# Patient Record
Sex: Female | Born: 1950 | Race: White | Hispanic: No | Smoking: Never smoker
Health system: Southern US, Community
[De-identification: ages and names within clinical notes are randomized; demographics above are authoritative.]

## PROBLEM LIST (undated history)

## (undated) DIAGNOSIS — I10 Essential (primary) hypertension: Secondary | ICD-10-CM

## (undated) DIAGNOSIS — I219 Acute myocardial infarction, unspecified: Secondary | ICD-10-CM

## (undated) DIAGNOSIS — N814 Uterovaginal prolapse, unspecified: Secondary | ICD-10-CM

## (undated) DIAGNOSIS — G459 Transient cerebral ischemic attack, unspecified: Secondary | ICD-10-CM

## (undated) DIAGNOSIS — I209 Angina pectoris, unspecified: Secondary | ICD-10-CM

## (undated) DIAGNOSIS — R7989 Other specified abnormal findings of blood chemistry: Secondary | ICD-10-CM

## (undated) DIAGNOSIS — F32A Depression, unspecified: Secondary | ICD-10-CM

## (undated) DIAGNOSIS — F0390 Unspecified dementia without behavioral disturbance: Secondary | ICD-10-CM

## (undated) DIAGNOSIS — G35 Multiple sclerosis: Secondary | ICD-10-CM

## (undated) DIAGNOSIS — K5732 Diverticulitis of large intestine without perforation or abscess without bleeding: Secondary | ICD-10-CM

## (undated) DIAGNOSIS — H269 Unspecified cataract: Secondary | ICD-10-CM

## (undated) DIAGNOSIS — I4719 Other supraventricular tachycardia: Secondary | ICD-10-CM

## (undated) DIAGNOSIS — I2510 Atherosclerotic heart disease of native coronary artery without angina pectoris: Secondary | ICD-10-CM

## (undated) DIAGNOSIS — G43909 Migraine, unspecified, not intractable, without status migrainosus: Secondary | ICD-10-CM

## (undated) DIAGNOSIS — IMO0001 Reserved for inherently not codable concepts without codable children: Secondary | ICD-10-CM

## (undated) DIAGNOSIS — E785 Hyperlipidemia, unspecified: Secondary | ICD-10-CM

## (undated) DIAGNOSIS — K219 Gastro-esophageal reflux disease without esophagitis: Secondary | ICD-10-CM

## (undated) DIAGNOSIS — G4730 Sleep apnea, unspecified: Secondary | ICD-10-CM

## (undated) DIAGNOSIS — I739 Peripheral vascular disease, unspecified: Secondary | ICD-10-CM

## (undated) DIAGNOSIS — G35D Multiple sclerosis, unspecified: Secondary | ICD-10-CM

## (undated) DIAGNOSIS — I471 Supraventricular tachycardia: Secondary | ICD-10-CM

## (undated) DIAGNOSIS — E119 Type 2 diabetes mellitus without complications: Secondary | ICD-10-CM

## (undated) DIAGNOSIS — F419 Anxiety disorder, unspecified: Secondary | ICD-10-CM

## (undated) DIAGNOSIS — Z8719 Personal history of other diseases of the digestive system: Secondary | ICD-10-CM

## (undated) DIAGNOSIS — I639 Cerebral infarction, unspecified: Secondary | ICD-10-CM

## (undated) DIAGNOSIS — I6529 Occlusion and stenosis of unspecified carotid artery: Secondary | ICD-10-CM

## (undated) DIAGNOSIS — I509 Heart failure, unspecified: Secondary | ICD-10-CM

## (undated) DIAGNOSIS — F329 Major depressive disorder, single episode, unspecified: Secondary | ICD-10-CM

## (undated) DIAGNOSIS — M1990 Unspecified osteoarthritis, unspecified site: Secondary | ICD-10-CM

## (undated) DIAGNOSIS — N189 Chronic kidney disease, unspecified: Secondary | ICD-10-CM

## (undated) DIAGNOSIS — Z794 Long term (current) use of insulin: Secondary | ICD-10-CM

## (undated) HISTORY — DX: Diverticulitis of large intestine without perforation or abscess without bleeding: K57.32

## (undated) HISTORY — DX: Anxiety disorder, unspecified: F41.9

## (undated) HISTORY — DX: Uterovaginal prolapse, unspecified: N81.4

## (undated) HISTORY — DX: Hyperlipidemia, unspecified: E78.5

## (undated) HISTORY — PX: CHOLECYSTECTOMY: SHX55

## (undated) HISTORY — DX: Transient cerebral ischemic attack, unspecified: G45.9

## (undated) HISTORY — DX: Major depressive disorder, single episode, unspecified: F32.9

## (undated) HISTORY — DX: Unspecified cataract: H26.9

## (undated) HISTORY — DX: Essential (primary) hypertension: I10

## (undated) HISTORY — DX: Multiple sclerosis, unspecified: G35.D

## (undated) HISTORY — DX: Peripheral vascular disease, unspecified: I73.9

## (undated) HISTORY — DX: Depression, unspecified: F32.A

## (undated) HISTORY — DX: Reserved for inherently not codable concepts without codable children: IMO0001

## (undated) HISTORY — DX: Multiple sclerosis: G35

## (undated) HISTORY — DX: Supraventricular tachycardia: I47.1

## (undated) HISTORY — PX: CORONARY ANGIOPLASTY WITH STENT PLACEMENT: SHX49

## (undated) HISTORY — DX: Gastro-esophageal reflux disease without esophagitis: K21.9

## (undated) HISTORY — DX: Heart failure, unspecified: I50.9

## (undated) HISTORY — DX: Atherosclerotic heart disease of native coronary artery without angina pectoris: I25.10

## (undated) HISTORY — DX: Long term (current) use of insulin: Z79.4

## (undated) HISTORY — DX: Other supraventricular tachycardia: I47.19

## (undated) HISTORY — DX: Type 2 diabetes mellitus without complications: E11.9

## (undated) HISTORY — PX: APPENDECTOMY: SHX54

## (undated) HISTORY — DX: Occlusion and stenosis of unspecified carotid artery: I65.29

---

## 1979-05-26 DIAGNOSIS — J189 Pneumonia, unspecified organism: Secondary | ICD-10-CM

## 1979-05-26 DIAGNOSIS — G459 Transient cerebral ischemic attack, unspecified: Secondary | ICD-10-CM

## 1979-05-26 HISTORY — DX: Pneumonia, unspecified organism: J18.9

## 1979-05-26 HISTORY — DX: Transient cerebral ischemic attack, unspecified: G45.9

## 1979-05-26 HISTORY — PX: BREAST LUMPECTOMY: SHX2

## 1979-05-26 HISTORY — PX: BREAST BIOPSY: SHX20

## 1984-09-24 HISTORY — PX: ABDOMINAL HYSTERECTOMY: SHX81

## 1999-10-24 ENCOUNTER — Inpatient Hospital Stay (HOSPITAL_COMMUNITY): Admission: EM | Admit: 1999-10-24 | Discharge: 1999-10-26 | Payer: Self-pay | Admitting: Cardiology

## 2000-09-24 HISTORY — PX: COLONOSCOPY: SHX174

## 2001-10-28 ENCOUNTER — Other Ambulatory Visit: Admission: RE | Admit: 2001-10-28 | Discharge: 2001-10-28 | Payer: Self-pay | Admitting: Family Medicine

## 2004-07-28 ENCOUNTER — Ambulatory Visit: Payer: Self-pay

## 2004-11-24 ENCOUNTER — Other Ambulatory Visit: Admission: RE | Admit: 2004-11-24 | Discharge: 2004-11-24 | Payer: Self-pay | Admitting: Family Medicine

## 2004-12-19 ENCOUNTER — Ambulatory Visit (HOSPITAL_COMMUNITY): Admission: RE | Admit: 2004-12-19 | Discharge: 2004-12-19 | Payer: Self-pay | Admitting: Neurology

## 2005-07-17 ENCOUNTER — Ambulatory Visit: Payer: Self-pay | Admitting: Psychiatry

## 2006-05-31 ENCOUNTER — Other Ambulatory Visit: Admission: RE | Admit: 2006-05-31 | Discharge: 2006-05-31 | Payer: Self-pay | Admitting: Family Medicine

## 2007-02-05 ENCOUNTER — Ambulatory Visit: Payer: Self-pay | Admitting: Cardiology

## 2007-02-12 ENCOUNTER — Ambulatory Visit: Payer: Self-pay

## 2007-03-18 ENCOUNTER — Ambulatory Visit: Payer: Self-pay | Admitting: Cardiology

## 2007-03-18 LAB — CONVERTED CEMR LAB: Cortisol, Plasma: 9.9 ug/dL

## 2007-03-21 ENCOUNTER — Ambulatory Visit: Payer: Self-pay | Admitting: Cardiology

## 2007-03-22 ENCOUNTER — Ambulatory Visit: Payer: Self-pay | Admitting: Cardiology

## 2007-03-22 ENCOUNTER — Inpatient Hospital Stay (HOSPITAL_COMMUNITY): Admission: EM | Admit: 2007-03-22 | Discharge: 2007-03-24 | Payer: Self-pay | Admitting: Cardiology

## 2007-03-24 ENCOUNTER — Ambulatory Visit: Payer: Self-pay | Admitting: *Deleted

## 2007-03-24 ENCOUNTER — Encounter: Payer: Self-pay | Admitting: Cardiology

## 2007-04-14 ENCOUNTER — Inpatient Hospital Stay (HOSPITAL_COMMUNITY): Admission: AD | Admit: 2007-04-14 | Discharge: 2007-04-17 | Payer: Self-pay | Admitting: Cardiology

## 2007-04-14 ENCOUNTER — Ambulatory Visit: Payer: Self-pay | Admitting: Cardiology

## 2007-05-21 ENCOUNTER — Ambulatory Visit: Payer: Self-pay | Admitting: Cardiology

## 2007-06-25 ENCOUNTER — Ambulatory Visit: Payer: Self-pay | Admitting: Cardiology

## 2007-11-26 ENCOUNTER — Ambulatory Visit: Payer: Self-pay | Admitting: Cardiology

## 2008-02-09 ENCOUNTER — Ambulatory Visit: Payer: Self-pay | Admitting: Internal Medicine

## 2008-02-09 ENCOUNTER — Inpatient Hospital Stay (HOSPITAL_COMMUNITY): Admission: EM | Admit: 2008-02-09 | Discharge: 2008-02-10 | Payer: Self-pay | Admitting: Emergency Medicine

## 2008-02-18 ENCOUNTER — Ambulatory Visit: Payer: Self-pay

## 2008-02-18 ENCOUNTER — Ambulatory Visit: Payer: Self-pay | Admitting: Cardiology

## 2008-02-18 ENCOUNTER — Encounter: Payer: Self-pay | Admitting: Cardiology

## 2008-02-18 LAB — CONVERTED CEMR LAB
BUN: 6 mg/dL (ref 6–23)
CO2: 32 meq/L (ref 19–32)
Chloride: 99 meq/L (ref 96–112)
Creatinine, Ser: 1.1 mg/dL (ref 0.4–1.2)
GFR calc non Af Amer: 54 mL/min
Potassium: 3.5 meq/L (ref 3.5–5.1)

## 2008-08-26 DIAGNOSIS — I779 Disorder of arteries and arterioles, unspecified: Secondary | ICD-10-CM | POA: Insufficient documentation

## 2008-08-26 DIAGNOSIS — I214 Non-ST elevation (NSTEMI) myocardial infarction: Secondary | ICD-10-CM

## 2008-08-26 DIAGNOSIS — I2510 Atherosclerotic heart disease of native coronary artery without angina pectoris: Secondary | ICD-10-CM | POA: Insufficient documentation

## 2008-08-26 DIAGNOSIS — I25119 Atherosclerotic heart disease of native coronary artery with unspecified angina pectoris: Secondary | ICD-10-CM | POA: Insufficient documentation

## 2008-08-26 DIAGNOSIS — I739 Peripheral vascular disease, unspecified: Secondary | ICD-10-CM

## 2008-08-26 DIAGNOSIS — I25110 Atherosclerotic heart disease of native coronary artery with unstable angina pectoris: Secondary | ICD-10-CM | POA: Insufficient documentation

## 2009-03-07 ENCOUNTER — Encounter (INDEPENDENT_AMBULATORY_CARE_PROVIDER_SITE_OTHER): Payer: Self-pay | Admitting: *Deleted

## 2009-05-10 DIAGNOSIS — I739 Peripheral vascular disease, unspecified: Secondary | ICD-10-CM

## 2009-05-10 DIAGNOSIS — E782 Mixed hyperlipidemia: Secondary | ICD-10-CM | POA: Insufficient documentation

## 2009-05-10 DIAGNOSIS — I2510 Atherosclerotic heart disease of native coronary artery without angina pectoris: Secondary | ICD-10-CM | POA: Insufficient documentation

## 2009-05-10 DIAGNOSIS — K5732 Diverticulitis of large intestine without perforation or abscess without bleeding: Secondary | ICD-10-CM | POA: Insufficient documentation

## 2009-05-10 DIAGNOSIS — G35 Multiple sclerosis: Secondary | ICD-10-CM

## 2009-05-10 DIAGNOSIS — F418 Other specified anxiety disorders: Secondary | ICD-10-CM | POA: Insufficient documentation

## 2009-05-10 DIAGNOSIS — E1169 Type 2 diabetes mellitus with other specified complication: Secondary | ICD-10-CM | POA: Insufficient documentation

## 2009-05-10 DIAGNOSIS — E785 Hyperlipidemia, unspecified: Secondary | ICD-10-CM

## 2009-09-28 ENCOUNTER — Ambulatory Visit: Payer: Self-pay | Admitting: Cardiology

## 2009-10-07 ENCOUNTER — Telehealth: Payer: Self-pay | Admitting: Cardiology

## 2009-10-07 ENCOUNTER — Encounter: Payer: Self-pay | Admitting: Cardiology

## 2009-10-21 ENCOUNTER — Ambulatory Visit: Payer: Self-pay

## 2009-10-21 ENCOUNTER — Encounter: Payer: Self-pay | Admitting: Cardiology

## 2009-12-28 ENCOUNTER — Telehealth: Payer: Self-pay | Admitting: Cardiology

## 2009-12-29 ENCOUNTER — Ambulatory Visit: Payer: Self-pay | Admitting: Cardiology

## 2009-12-30 LAB — CONVERTED CEMR LAB
BUN: 14 mg/dL (ref 6–23)
Basophils Relative: 0.5 % (ref 0.0–3.0)
CO2: 34 meq/L — ABNORMAL HIGH (ref 19–32)
Chloride: 102 meq/L (ref 96–112)
Eosinophils Absolute: 0.1 10*3/uL (ref 0.0–0.7)
Eosinophils Relative: 1.6 % (ref 0.0–5.0)
HCT: 34.4 % — ABNORMAL LOW (ref 36.0–46.0)
Lymphs Abs: 3.5 10*3/uL (ref 0.7–4.0)
MCHC: 34.2 g/dL (ref 30.0–36.0)
MCV: 91.7 fL (ref 78.0–100.0)
Monocytes Absolute: 0.6 10*3/uL (ref 0.1–1.0)
Potassium: 3.2 meq/L — ABNORMAL LOW (ref 3.5–5.1)
RBC: 3.76 M/uL — ABNORMAL LOW (ref 3.87–5.11)
WBC: 6.9 10*3/uL (ref 4.5–10.5)

## 2010-01-05 ENCOUNTER — Inpatient Hospital Stay (HOSPITAL_BASED_OUTPATIENT_CLINIC_OR_DEPARTMENT_OTHER): Admission: RE | Admit: 2010-01-05 | Discharge: 2010-01-05 | Payer: Self-pay | Admitting: Cardiology

## 2010-01-05 ENCOUNTER — Ambulatory Visit: Payer: Self-pay | Admitting: Cardiology

## 2010-01-11 ENCOUNTER — Ambulatory Visit: Payer: Self-pay | Admitting: Cardiology

## 2010-01-11 ENCOUNTER — Inpatient Hospital Stay (HOSPITAL_COMMUNITY): Admission: RE | Admit: 2010-01-11 | Discharge: 2010-01-12 | Payer: Self-pay | Admitting: Cardiology

## 2010-02-09 ENCOUNTER — Encounter: Payer: Self-pay | Admitting: Cardiology

## 2010-02-10 ENCOUNTER — Ambulatory Visit: Payer: Self-pay | Admitting: Cardiology

## 2010-02-10 DIAGNOSIS — R002 Palpitations: Secondary | ICD-10-CM

## 2010-03-16 ENCOUNTER — Encounter: Payer: Self-pay | Admitting: Cardiology

## 2010-06-26 ENCOUNTER — Encounter: Admission: RE | Admit: 2010-06-26 | Discharge: 2010-06-26 | Payer: Self-pay | Admitting: Specialist

## 2010-10-24 NOTE — Cardiovascular Report (Signed)
Summary: Pre Op Orders  Pre Op Orders   Imported By: Roderic Ovens 10/10/2009 11:31:07  _____________________________________________________________________  External Attachment:    Type:   Image     Comment:   External Document

## 2010-10-24 NOTE — Miscellaneous (Signed)
Summary: rx for KCL   Clinical Lists Changes  Medications: Added new medication of POTASSIUM CHLORIDE CR 10 MEQ CR-CAPS (POTASSIUM CHLORIDE) one daily - Signed Rx of POTASSIUM CHLORIDE CR 10 MEQ CR-CAPS (POTASSIUM CHLORIDE) one daily;  #30 x 6;  Signed;  Entered by: Charolotte Capuchin, RN;  Authorized by: Rollene Rotunda, MD, [facility];  Method used: Electronically to [facility]*, [address], [place], [place], [place]  [address], Ph: [phone], Fax: [phone]    Prescriptions: POTASSIUM CHLORIDE CR 10 MEQ CR-CAPS (POTASSIUM CHLORIDE) one daily  #30 x 6   Entered by:   Charolotte Capuchin, RN   Authorized by:   Rollene Rotunda, MD, [facility]   Signed by:   Charolotte Capuchin, RN on 12/30/2009   Method used:   Electronically to        [facility]* (retail)       [address]       [place], [place]  [address]       Ph: [phone]       Fax: [phone]   RxID:   [id number]

## 2010-10-24 NOTE — Progress Notes (Signed)
Summary: cx cath  Phone Note From Other Clinic   Summary of Call: received call from [facility] in [facility] lab, he stated pt no showed for cath, when he called and spoke w/pt she told him she had talked to someone the office yest and cancelled cath, Loraine Leriche states Dr Antoine Poche wants pt resch for [facility] 1/17.  Upon looking into it [place] had called pt to sch her carotids and pt stated she didn't feel well and needed to cx, Merita Norton stated she passed mess along to [employer] and spoke w/pt, she states she has too much going on doesn't feel well and will call back when she is ready to sch cath, explained Dr Antoine Poche felt it was important for pt to have cath soon and offered to sch it for Mon 1/17, again pt states she will c/b to sch, will send mess to [facility] and Dr Clent Demark, RN  October 07, 2009 12:06 PM

## 2010-10-24 NOTE — Miscellaneous (Signed)
Summary: Orders Update  Clinical Lists Changes  Medications: Added new medication of PREDNISONE 20 MG TABS (PREDNISONE) take 3 tablet evening before cath and 3 tablets the morning of at 9:30am - Signed Rx of PREDNISONE 20 MG TABS (PREDNISONE) take 3 tablet evening before cath and 3 tablets the morning of at 9:30am;  #6 x 0;  Signed;  Entered by: Charolotte Capuchin, RN;  Authorized by: Rollene Rotunda, MD, [facility];  Method used: Electronically to [facility]*, [address] [place], [place], [place]  [address], Ph: [phone], Fax: [phone]    Prescriptions: PREDNISONE 20 MG TABS (PREDNISONE) take 3 tablet evening before cath and 3 tablets the morning of at 9:30am  #6 x 0   Entered by:   Charolotte Capuchin, RN   Authorized by:   Rollene Rotunda, MD, [facility]   Signed by:   Charolotte Capuchin, RN on 12/29/2009   Method used:   Electronically to        [facility]* (retail)       [address]       [place], [place]  [address]       Ph: [phone]       Fax: [phone]   RxID:   [id number]

## 2010-10-24 NOTE — Progress Notes (Signed)
Summary: Pt calling to set up cath.  Phone Note Call from Patient Call back at Home Phone [phone]   Caller: Patient Summary of Call: Pt calling to set up a cath Initial call taken by: Judie Grieve,  December 28, 2009 1:55 PM  Follow-up for Phone Call        Called pt and advised that she needs to see Dr. Antoine Poche for h&p pre-cath since it has been 3 months since last office visit.  Appoint made for 12/29/09 at 2pm. Follow-up by: Mylo Red RN BSN

## 2010-10-24 NOTE — Assessment & Plan Note (Signed)
Summary: EPH/JML   Visit Type:  Follow-up Primary Provider:  Dr. Vernon Prey  CC:  Chest Pain and Palpitations.  History of Present Illness: The patient presents for evaluation of palpitations. She is also status post a recent percutaneous intervention treating an 80% diagonal stenosis. Since that time she has had one episode of severe chest discomfort. This happened when her children were in her house and fighting. She became very stressed and developed chest pain. She had taken nitroglycerin and alprazolam. She is otherwise not getting the chest discomfort with any physical exertion though she doesn't overly exert herself. She recently walked around a museum and became tired but did not have chest pain. However, she is having episodes of palpitations. These happen sporadically. She may notice some when she bends over. She might feel somewhat dizzy but has had no presyncope or syncope. She says that time she's felt a staggering gait and has leg weakness and wonders if it could be her statin. However, she also has this vague diagnosis of possible multiple sclerosis for which she received no followup or treatment.  Current Medications (verified): 1)  Crestor 40 Mg Tabs (Rosuvastatin Calcium) .Marland Kitchen.. 1 By Mouth Daily 2)  Zetia 10 Mg Tabs (Ezetimibe) .... Take One Tablet By Mouth Daily. 3)  Isosorbide Mononitrate Cr 120 Mg Xr24h-Tab (Isosorbide Mononitrate) .Marland Kitchen.. 1 By Mouth Daily 4)  Fish Oil   Oil (Fish Oil) .Marland Kitchen.. 1 By Mouth Once Daily 5)  Furosemide 40 Mg Tabs (Furosemide) .Marland Kitchen.. 1 By Mouth Two Times A Day 6)  Lantus 100 Unit/ml Soln (Insulin Glargine) .... As Directed 7)  Atenolol 50 Mg Tabs (Atenolol) .... Take One and Half Tablet By Mouth Two Times A Day 8)  Zolpidem Tartrate 10 Mg Tabs (Zolpidem Tartrate) .... As Needed 9)  Alprazolam 0.5 Mg Tabs (Alprazolam) .... As Needed 10)  Lisinopril 20 Mg Tabs (Lisinopril) .Marland Kitchen.. 1 By Mouth Daily 11)  Plavix 75 Mg Tabs (Clopidogrel Bisulfate) .Marland Kitchen.. 1 By Mouth  Daily 12)  Citalopram Hydrobromide 20 Mg Tabs (Citalopram Hydrobromide) .Marland Kitchen.. 1 By Mouth Daily 13)  Multivitamins   Tabs (Multiple Vitamin) .Marland Kitchen.. 1 By Mouth Daily 14)  Aspirin 325 Mg  Tabs (Aspirin) .Marland Kitchen.. 1 By Mouth Daily 15)  Actos 30 Mg Tabs (Pioglitazone Hcl) .Marland Kitchen.. 1 Tab Once Daily 16)  Humalog 100 Unit/ml Soln (Insulin Lispro (Human)) .... As Directed 17)  Potassium Chloride Cr 10 Meq Cr-Caps (Potassium Chloride) .... One Daily 18)  Nitrostat 0.4 Mg Subl (Nitroglycerin) .Marland Kitchen.. 1 Tablet Under Tongue At Onset of Chest Pain; You May Repeat Every 5 Minutes For Up To 3 Doses. 19)  Janumet  (Sitagliptin-Metformin Hcl) .Marland Kitchen.. 1 Tab By Mouth Once Daily  Allergies (verified): 1)  ! Codeine 2)  ! * Ivp Dye 3)  ! Codeine 4)  ! * Ivp Dye  Past History:  Past Medical History: HYPOKALEMIA (ICD-276.8) DIVERTICULITIS, COLON (ICD-562.11) MULTIPLE SCLEROSIS (ICD-340) (diagnosis not confirmed) DEPRESSION (ICD-311) PVD (ICD-443.9) DM (ICD-250.00) HYPERLIPIDEMIA (ICD-272.4) HYPERTENSION (ICD-401.9) CAD (ICD-414.00)(status post non STEMI with Taxus drug     eluting stent to the circumflex coronary artery June 2008.  She had     a non drug eluting stent placed to an left anterior descending     artery  in July as well as angioplasty to the  diagonal.  Diag 80% stenosis with PCI April 2011). CHEST PAIN (ICD-786.50) Mild carotid plaque  Past Surgical History: Reviewed history from 09/28/2009 and no changes required. Hysterectomy.  Appendectomy.  Cholecystectomy  Review of  Systems       As stated in the HPI and negative for all other systems.   Vital Signs:  Patient profile:   60 year old female Height:      66 inches Weight:      204 pounds BMI:     33.05 Pulse rate:   75 / minute Resp:     14 per minute BP sitting:   138 / 76  (left arm)  Vitals Entered By: Kem Parkinson (Feb 10, 2010 9:57 AM)  Physical Exam  General:  Well developed, well nourished, in no acute distress. Head:   normocephalic and atraumatic Eyes:  PERRLA/EOM intact; conjunctiva and lids normal. Mouth:  Teeth, gums and palate normal. Oral mucosa normal. Neck:  Neck supple, no JVD. No masses, thyromegaly or abnormal cervical nodes. Chest Wall:  no deformities or breast masses noted Lungs:  Clear bilaterally to auscultation and percussion. Abdomen:  Bowel sounds positive; abdomen soft and non-tender without masses, organomegaly, or hernias noted. No hepatosplenomegaly. Msk:  Back normal, normal gait. Muscle strength and tone normal. Extremities:  No clubbing or cyanosis. Neurologic:  Alert and oriented x 3. Skin:  Intact without lesions or rashes. Cervical Nodes:  no significant adenopathy Inguinal Nodes:  no significant adenopathy Psych:  Normal affect.   Detailed Cardiovascular Exam  Neck    Carotids: Carotids full and equal bilaterally without bruits.      Neck Veins: Normal, no JVD.    Heart    Inspection: no deformities or lifts noted.      Palpation: normal PMI with no thrills palpable.      Auscultation: regular rate and rhythm, S1, S2 without murmurs, rubs, gallops, or clicks.    Vascular    Abdominal Aorta: no palpable masses, pulsations, or audible bruits.      Femoral Pulses: normal femoral pulses bilaterally.      Pedal Pulses: normal pedal pulses bilaterally.      Radial Pulses: normal radial pulses bilaterally.      Peripheral Circulation: no clubbing, cyanosis, or edema noted with normal capillary refill.     EKG  Procedure date:  02/10/2010  Findings:      Sinus rhythm, rate 75, axis leftward,  intervals slightly prolonged QTC, and nonspecific diffuse T-wave flattening,  Impression & Recommendations:  Problem # 1:  CAD (ICD-414.00) She has had one episode of chest discomfort requiring nitroglycerin. At this point I do not suspect recurrent stenosis. Rather this is associated with emotional stress which I think should be addressed further. She will continue with risk  reduction.  Problem # 2:  PALPITATIONS (ICD-785.1) She is having episodes of dizziness and palpitations for which I will place a 21 day event monitor. Orders: Event (Event)  Problem # 3:  MULTIPLE SCLEROSIS (ICD-340) The patient has a vague non-confirmed diagnosis of this. She complains of leg weakness and staggering. She thinks it could be the statin. However, I told her we could not clarify this without neurology input to see whether she truly does or does not have multiple sclerosis. I have encouraged her to get neurology followup.  Problem # 4:  CAROTID STENOSIS (ICD-433.10) We reviewed these recent results. She has mild bilateral stenosis and will be reassessed in 2 years.  Problem # 5:  HYPERTENSION (ICD-401.9) Her blood pressure is controlled and she will continue the meds as listed.  Patient Instructions: 1)  Your physician recommends that you schedule a follow-up appointment in: 6 MONTHS WITH DR [facility] 2)  Your physician recommends that you continue on your current medications as directed. Please refer to the Current Medication list given to you today. 3)  Your physician has recommended that you wear an event monitor.  Event monitors are medical devices that record the heart's electrical activity. Doctors most often use these monitors to diagnose arrhythmias. Arrhythmias are problems with the speed or rhythm of the heartbeat. The monitor is a small, portable device. You can wear one while you do your normal daily activities. This is usually used to diagnose what is causing palpitations/syncope (passing out).

## 2010-10-24 NOTE — Cardiovascular Report (Signed)
Summary: Pre Cath Orders  Pre Cath Orders   Imported By: Roderic Ovens 01/23/2010 16:51:47  _____________________________________________________________________  External Attachment:    Type:   Image     Comment:   External Document

## 2010-10-24 NOTE — Miscellaneous (Signed)
  Clinical Lists Changes  Observations: Added new observation of CARDCATHFIND:   Successful percutaneous angioplasty of a focal in-stent   restenosis and a previously placed stent from 3 years earlier.  (01/12/2010 10:29) Added new observation of CARDCATHFIND:  Hemodynamics:  LV 188/29, AO 185/10.      Coronaries:  Left main was normal.  The LAD had a proximal mid stent   which was patent with luminal irregularities.  The first diagonal was   moderate sized with proximal 30% stenosis.  Circumflex in the AV groove   was normal.  Mid obtuse marginal was large with a proximal stent.  There   was 80% stenosis within the distal end of the stent.  The right coronary   artery was dominant.  There were luminal irregularities.  The PDA was   moderate sized with luminal irregularities.  Posterolateral was moderate   sized and normal.      Left Ventriculogram:  The left ventriculogram was obtained in the RAO   projection.  The EF was 65% and normal.      CONCLUSIONS:  Single-vessel coronary artery disease.      PLAN:  The patient will need percutaneous revascularization of the   circumflex.  I will review this with Dr. Riley Kill.     (01/06/2010 10:30)      Cardiac Cath  Procedure date:  01/12/2010  Findings:        Successful percutaneous angioplasty of a focal in-stent   restenosis and a previously placed stent from 3 years earlier.   Cardiac Cath  Procedure date:  01/06/2010  Findings:       Hemodynamics:  LV 188/29, AO 185/10.      Coronaries:  Left main was normal.  The LAD had a proximal mid stent   which was patent with luminal irregularities.  The first diagonal was   moderate sized with proximal 30% stenosis.  Circumflex in the AV groove   was normal.  Mid obtuse marginal was large with a proximal stent.  There   was 80% stenosis within the distal end of the stent.  The right coronary   artery was dominant.  There were luminal irregularities.  The PDA was   moderate  sized with luminal irregularities.  Posterolateral was moderate   sized and normal.      Left Ventriculogram:  The left ventriculogram was obtained in the RAO   projection.  The EF was 65% and normal.      CONCLUSIONS:  Single-vessel coronary artery disease.      PLAN:  The patient will need percutaneous revascularization of the   circumflex.  I will review this with Dr. Riley Kill.

## 2010-10-24 NOTE — Miscellaneous (Signed)
Summary: Orders Update  Clinical Lists Changes  Orders: Added new Test order of Carotid Duplex (Carotid Duplex) - Signed 

## 2010-10-24 NOTE — Assessment & Plan Note (Signed)
Summary: [name]   Visit Type:  Follow-up Primary Provider:  Dr. Vernon Prey  CC:  CAD.  History of Present Illness: The patient presents for followup of her known coronary disease. When I last saw her she was complaining of some chest discomfort but deferred further evaluation as her symptoms are not particularly bothersome and not described as unstable. She was post to come back for followup but for some reason this did not happen. She is now coming back for followup and reports that she is having increasing chest discomfort. She describes this on her left side under her sternum and to the left. It happens daily. It happens at rest. She is not particularly active. It may be a sharp shooting fleeting discomfort. Alternatively it may be a more significant lingering discomfort with some mild left arm discomfort. It seems at times similar to previous angina. She doesn't describe neck discomfort. She does not have activity induced nausea vomiting or diaphoresis. She does take routinely about 3 or 4 nitroglycerin per week to treat this pain. Again she thinks the frequency and intensity is increasing. She also has had increasing fatigue.  Current Medications (verified): 1)  Crestor 40 Mg Tabs (Rosuvastatin Calcium) .Marland Kitchen.. 1 By Mouth Daily 2)  Zetia 10 Mg Tabs (Ezetimibe) .... Take One Tablet By Mouth Daily. 3)  Isosorbide Mononitrate Cr 120 Mg Xr24h-Tab (Isosorbide Mononitrate) .Marland Kitchen.. 1 By Mouth Daily 4)  Fish Oil   Oil (Fish Oil) .Marland Kitchen.. 1 By Mouth Once Daily 5)  Furosemide 40 Mg Tabs (Furosemide) .Marland Kitchen.. 1 By Mouth Two Times A Day 6)  Lantus 100 Unit/ml Soln (Insulin Glargine) .... As Directed 7)  Atenolol 50 Mg Tabs (Atenolol) .... Take One and Half Tablet By Mouth Two Times A Day 8)  Zolpidem Tartrate 10 Mg Tabs (Zolpidem Tartrate) .... As Needed 9)  Alprazolam 0.5 Mg Tabs (Alprazolam) .... As Needed 10)  Lisinopril 10 Mg Tabs (Lisinopril) .Marland Kitchen.. 1 By Mouth Daily 11)  Plavix 75 Mg Tabs (Clopidogrel  Bisulfate) .Marland Kitchen.. 1 By Mouth Daily 12)  Citalopram Hydrobromide 20 Mg Tabs (Citalopram Hydrobromide) .Marland Kitchen.. 1 By Mouth Daily 13)  Multivitamins   Tabs (Multiple Vitamin) .Marland Kitchen.. 1 By Mouth Daily 14)  Aspirin 325 Mg  Tabs (Aspirin) .Marland Kitchen.. 1 By Mouth Daily 15)  Janumet 50-500 Mg Tabs (Sitagliptin-Metformin Hcl) .... 2 By Mouth Daily  Allergies (verified): 1)  ! Codeine 2)  ! * Ivp Dye  Past History:  Past Medical History: HYPOKALEMIA (ICD-276.8) DIVERTICULITIS, COLON (ICD-562.11) MULTIPLE SCLEROSIS (ICD-340) DEPRESSION (ICD-311) PVD (ICD-443.9) DM (ICD-250.00) HYPERLIPIDEMIA (ICD-272.4) HYPERTENSION (ICD-401.9) CAD (ICD-414.00)(status post non STEMI with Taxus drug     eluting stent to the circumflex coronary artery June 2008.  She had     a non drug eluting stent placed to an left anterior descending     artery  in July as well as angioplasty to the  diagonal). CHEST PAIN (ICD-786.50) 40-60% right internal carotid plaque (last evaluated in the hospital August 2008).  Past Surgical History: Hysterectomy.  Appendectomy.  Cholecystectomy  Review of Systems       Burning bilateral leg pain at night right greater than left.  Vital Signs:  Patient profile:   60 year old female Height:      66 inches Weight:      199 pounds BMI:     32.24 Pulse rate:   78 / minute Resp:     16 per minute BP sitting:   182 / 90  (right arm)  Vitals Entered By: Marrion Coy, CNA (September 28, 2009 1:54 PM)  Physical Exam  General:  Well developed, well nourished, in no acute distress. Head:  normocephalic and atraumatic Eyes:  PERRLA/EOM intact; conjunctiva and lids normal. Mouth:  Edentulous, gums and palate normal. Oral mucosa normal. Neck:  Neck supple, no JVD. No masses, thyromegaly or abnormal cervical nodes. Chest Wall:  no deformities or breast masses noted Lungs:  Clear bilaterally to auscultation and percussion. Abdomen:  Bowel sounds positive; abdomen soft and non-tender without  masses, organomegaly, or hernias noted. No hepatosplenomegaly. Msk:  Back normal, normal gait. Muscle strength and tone normal. Extremities:  No clubbing or cyanosis. Neurologic:  Alert and oriented x 3. Skin:  Intact without lesions or rashes. Cervical Nodes:  no significant adenopathy Axillary Nodes:  no significant adenopathy Inguinal Nodes:  no significant adenopathy Psych:  Normal affect.   Detailed Cardiovascular Exam  Neck    Carotids: Carotids full and equal bilaterally without bruits.      Neck Veins: Normal, no JVD.    Heart    Inspection: no deformities or lifts noted.      Palpation: normal PMI with no thrills palpable.      Auscultation: regular rate and rhythm, S1, S2 without murmurs, rubs, gallops, or clicks.    Vascular    Abdominal Aorta: no palpable masses, pulsations, or audible bruits.      Femoral Pulses: normal femoral pulses bilaterally.      Pedal Pulses: normal pedal pulses bilaterally.      Radial Pulses: normal radial pulses bilaterally.      Peripheral Circulation: no clubbing, cyanosis, or edema noted with normal capillary refill.     EKG  Procedure date:  09/28/2009  Findings:      sinus rhythm, rate 78, old inferior infarct, nonspecific anterolateral T-wave inversions, not significantly changed from previous  Impression & Recommendations:  Problem # 1:  CAD (ICD-414.00) The patient has had increasing chest discomfort. This is similar to previous angina. She has had obstructive coronary disease requiring multiple percutaneous interventions. She had a negative stress test a few weeks prior to one of her acute presentations. At this point the pretest probability of obstructive coronary disease causing her symptoms is so high that stress perfusion imaging is not indicated. Rather she should proceed directly to cardiac catheterization. We had this conversation. She understands the risks and benefits and agrees to proceed. She will be medicated for her  dye allergy.  Problem # 2:  CAROTID STENOSIS (ICD-433.10) She had moderate carotid stenosis last evaluated in 2008. I will obtain followup studies.  Problem # 3:  HYPERTENSION (ICD-401.9) Her blood pressure is elevated and it has been on other office visits. I will increase her lisinopril to 20 mg daily.  Problem # 4:  HYPERLIPIDEMIA (ICD-272.4) This his followed by her primary physician. Her last LDL was 27 with an HDL of 37. I will defer to Dr. [name].  Other Orders: EKG w/ Interpretation (93000)   Rx entered below in wrong. New RX called into pharmacy for Prednisone 20mg  3 by mouth night before cath, 3 by mouth 1 hour before cath # 6 0 refills Marrion Coy, CNA  September 28, 2009 3:13 PM  Patient Instructions: 1)  Your physician recommends that you schedule a follow-up appointment after cath. 2)  Your physician has recommended you make the following change in your medication: Increase Lisinopril to 20 mg a day. 3)  Take Prednisone 20 mg, three tablets at  6pm the night before and 3 tablets, one hour before cath. 4)  Your physician recommends that you have lab work today for BMP/PT/PTT and CBC.  Prescriptions: PREDNISONE 20 MG TABS (PREDNISONE) 3 TABLETS by mouth 1 HOUR BEFORE CATH  #3 x 0   Entered by:   Marrion Coy, CNA   Authorized by:   Rollene Rotunda, MD, [facility]   Signed by:   Marrion Coy, CNA on 09/28/2009   Method used:   Electronically to        [facility]* (retail)       [address]       [place], [place]  [address]       Ph: [phone]       Fax: [phone]   RxID:   [id number] LISINOPRIL 20 MG TABS (LISINOPRIL) 1 by mouth daily  #30 x 6   Entered by:   Marrion Coy, CNA   Authorized by:   Rollene Rotunda, MD, [facility]   Signed by:   Marrion Coy, CNA on 09/28/2009   Method used:   Electronically to        [facility]* (retail)       [address]       [place], [place]   [address]       Ph: [phone]       Fax: [phone]   RxID:   [id number]

## 2010-10-24 NOTE — Procedures (Signed)
Summary: [facility] Patient Activity Report   [facility] Patient Activity Report   Imported By: Roderic Ovens 03/20/2010 08:48:59  _____________________________________________________________________  External Attachment:    Type:   Image     Comment:   External Document

## 2010-10-24 NOTE — Letter (Signed)
Summary: Cardiac Catheterization Instructions- [facility]  [employer], Main Office  [address]   [place], [place] [address]   Phone: [phone]  Fax: [phone]         12/29/2009 MRN: [id number]  CARELY NAPPIER [address] [place], [place]  [address]  Dear Ms. Sou,   You are scheduled for a Cardiac Catheterization on 01/05/2010 with Dr.Selestino Nila  Please arrive to the 1st floor of the Heart and V[facility] Center at [facility] at 10 am on the day of your procedure. Please do not arrive before 6:30 a.m. Call the Heart and Vascular Center at [phone] if you are unable to make your appointmnet. The Code to get into the parking garage under the building is 0001. Take the elevators to the 1st floor. You must have someone to drive you home. Someone must be with you for the first 24 hours after you arrive home. Please wear clothes that are easy to get on and off and wear slip-on shoes. Do not eat or drink after midnight except water with your medications that morning. Bring all your medications and current insurance cards with you.  ___ DO NOT take these medications before your procedure: ________________________________________________________________  ___ Make sure you take your aspirin.  ___ You may take ALL of your medications with water that morning.   The usual length of stay after your procedure is 2 to 3 hours. This can vary.  If you have any questions, please call the office at the number listed above.   Charolotte Capuchin, RN

## 2010-10-24 NOTE — Assessment & Plan Note (Signed)
Summary: pre-cath h&p per phone call 4/6/dfg   Visit Type:  Follow-up Primary Provider:  Dr. Vernon Prey   History of Present Illness: The patient presents for evaluation of chest discomfort. I saw her in January for the same complaint and she was to have cardiac catheterization. However, she did not present for this procedure being afraid of having a On Plavix and aspirin. Unfortunately she has continued to have increasing chest discomfort. She'll get discomfort after walking afor a few minutes on a treadmill. She's also been having some resting burning chest discomfort. It is substernal pressure-like discomfort as previously described. There is no radiation to her neck or arms. She does get dyspnea with exertion but is not describing new PND. She does sleep on 3 pillows. She's had some mild lower extremity swelling. She does think the symptoms are getting worse. She has taken nitroglycerin 3-4 times over the past several weeks.  Current Medications (verified): 1)  Crestor 40 Mg Tabs (Rosuvastatin Calcium) .Marland Kitchen.. 1 By Mouth Daily 2)  Zetia 10 Mg Tabs (Ezetimibe) .... Take One Tablet By Mouth Daily. 3)  Isosorbide Mononitrate Cr 120 Mg Xr24h-Tab (Isosorbide Mononitrate) .Marland Kitchen.. 1 By Mouth Daily 4)  Fish Oil   Oil (Fish Oil) .Marland Kitchen.. 1 By Mouth Once Daily 5)  Furosemide 40 Mg Tabs (Furosemide) .Marland Kitchen.. 1 By Mouth Two Times A Day 6)  Lantus 100 Unit/ml Soln (Insulin Glargine) .... As Directed 7)  Atenolol 50 Mg Tabs (Atenolol) .... Take One and Half Tablet By Mouth Two Times A Day 8)  Zolpidem Tartrate 10 Mg Tabs (Zolpidem Tartrate) .... As Needed 9)  Alprazolam 0.5 Mg Tabs (Alprazolam) .... As Needed 10)  Lisinopril 20 Mg Tabs (Lisinopril) .Marland Kitchen.. 1 By Mouth Daily 11)  Plavix 75 Mg Tabs (Clopidogrel Bisulfate) .Marland Kitchen.. 1 By Mouth Daily 12)  Citalopram Hydrobromide 20 Mg Tabs (Citalopram Hydrobromide) .Marland Kitchen.. 1 By Mouth Daily 13)  Multivitamins   Tabs (Multiple Vitamin) .Marland Kitchen.. 1 By Mouth Daily 14)  Aspirin 325 Mg  Tabs  (Aspirin) .Marland Kitchen.. 1 By Mouth Daily 15)  Actos 30 Mg Tabs (Pioglitazone Hcl) .Marland Kitchen.. 1 Tab Once Daily 16)  Humalog 100 Unit/ml Soln (Insulin Lispro (Human)) .... As Directed  Allergies (verified): 1)  ! Codeine 2)  ! * Ivp Dye 3)  ! Codeine 4)  ! * Ivp Dye  Past History:  Past Medical History: Reviewed history from 09/28/2009 and no changes required. HYPOKALEMIA (ICD-276.8) DIVERTICULITIS, COLON (ICD-562.11) MULTIPLE SCLEROSIS (ICD-340) DEPRESSION (ICD-311) PVD (ICD-443.9) DM (ICD-250.00) HYPERLIPIDEMIA (ICD-272.4) HYPERTENSION (ICD-401.9) CAD (ICD-414.00)(status post non STEMI with Taxus drug     eluting stent to the circumflex coronary artery June 2008.  She had     a non drug eluting stent placed to an left anterior descending     artery  in July as well as angioplasty to the  diagonal). CHEST PAIN (ICD-786.50) 40-60% right internal carotid plaque (last evaluated in the hospital August 2008).  Past Surgical History: Reviewed history from 09/28/2009 and no changes required. Hysterectomy.  Appendectomy.  Cholecystectomy  Family History: Reviewed history from 05/10/2009 and no changes required.  Mother is alive at age 66 with the history of MI and   diabetes.  Father died of an MI at 42.  She has a brother, who has had 6  MIs and is status post CABG.  She has a sister, who is alive and well.   Social History: Reviewed history from 05/10/2009 and no changes required.  She lives at [place] with her husband.  She is on   disability.  She denied tobacco, alcohol, or drug use.  She is not   routinely exercising.   Review of Systems       As stated in the HPI and negative for all other systems.   Vital Signs:  Patient profile:   60 year old female Height:      66 inches Weight:      207 pounds Pulse rate:   69 / minute BP sitting:   129 / 67  (left arm) Cuff size:   regular  Vitals Entered By: Burnett Kanaris, CNA (December 29, 2009 2:24 PM)  Physical Exam  General:   Well developed, well nourished, in no acute distress. Head:  normocephalic and atraumatic Eyes:  PERRLA/EOM intact; conjunctiva and lids normal. Mouth:  Edentulous, gums and palate normal. Oral mucosa normal. Neck:  Neck supple, no JVD. No masses, thyromegaly or abnormal cervical nodes. Chest Wall:  no deformities or breast masses noted Lungs:  Clear bilaterally to auscultation and percussion. Abdomen:  Bowel sounds positive; abdomen soft and non-tender without masses, organomegaly, or hernias noted. No hepatosplenomegaly. Msk:  Back normal, normal gait. Muscle strength and tone normal. Extremities:  No clubbing or cyanosis. Neurologic:  Alert and oriented x 3. Skin:  Intact without lesions or rashes. Cervical Nodes:  no significant adenopathy Axillary Nodes:  no significant adenopathy Inguinal Nodes:  no significant adenopathy Psych:  Normal affect.   Detailed Cardiovascular Exam  Neck    Carotids: Carotids full and equal bilaterally without bruits.      Neck Veins: Normal, no JVD.    Heart    Inspection: no deformities or lifts noted.      Palpation: normal PMI with no thrills palpable.      Auscultation: regular rate and rhythm, S1, S2 without murmurs, rubs, gallops, or clicks.    Vascular    Abdominal Aorta: no palpable masses, pulsations, or audible bruits.      Femoral Pulses: normal femoral pulses bilaterally.      Pedal Pulses: normal pedal pulses bilaterally.      Radial Pulses: normal radial pulses bilaterally.      Peripheral Circulation: no clubbing, cyanosis, or edema noted with normal capillary refill.     EKG  Procedure date:  12/29/2009  Findings:      sinus rhythm, rate 69, axis within normal limits, QT prolonged, nonspecific diffuse T-wave flattening, poor anterior R-wave progression  Impression & Recommendations:  Problem # 1:  CAD (ICD-414.00) The patient again presents with symptoms consistent with unstable angina. It has been a slowly progressive  pattern. She now does consent to cardiac catheterization understanding that we do this on Plavix and aspirin frequently. I have previously described the procedure to her and she has been through it before. She understands all risks and benefits and agrees to proceed. She has subungual nitroglycerin. If she has any significant chest pain episode she will present to the emergency room. Orders: TLB-BMP (Basic Metabolic Panel-BMET) (80048-METABOL) TLB-CBC Platelet - w/Differential (85025-CBCD) TLB-PTT (85730-PTTL) TLB-PT (Protime) (85610-PTP)  Problem # 2:  CAROTID STENOSIS (ICD-433.10) She had a recent carotid Doppler with bilateral 0-39% stenosis. This will be followed up in 2 years.  Problem # 3:  HYPERTENSION (ICD-401.9) Her blood pressure is controlled and she will continue the meds as listed.  Other Orders: EKG w/ Interpretation (93000)  Patient Instructions: 1)  Your physician recommends that you schedule a follow-up appointment after cath 2)  Your physician recommends that you  have lab work today; BMP,CBC,PT,PTT 414.01  786.51 3)  Your physician recommends that you continue on your current medications as directed. Please refer to the Current Medication list given to you today. 4)  Your physician has requested that you have a cardiac catheterization.  Cardiac catheterization is used to diagnose and/or treat various heart conditions. Doctors may recommend this procedure for a number of different reasons. The most common reason is to evaluate chest pain. Chest pain can be a symptom of coronary artery disease (CAD), and cardiac catheterization can show whether plaque is narrowing or blocking your heart's arteries. This procedure is also used to evaluate the valves, as well as measure the blood flow and oxygen levels in different parts of your heart.  For further information please visit [email or web address]  Please follow instruction sheet, as given.

## 2010-11-22 ENCOUNTER — Ambulatory Visit (INDEPENDENT_AMBULATORY_CARE_PROVIDER_SITE_OTHER): Payer: Medicare Other | Admitting: Cardiology

## 2010-11-22 ENCOUNTER — Encounter: Payer: Self-pay | Admitting: Cardiology

## 2010-11-22 DIAGNOSIS — I2510 Atherosclerotic heart disease of native coronary artery without angina pectoris: Secondary | ICD-10-CM

## 2010-11-30 NOTE — Assessment & Plan Note (Signed)
Summary: rov per pt call/lg   Visit Type:  Follow-up Primary Provider:  Dr. Vernon Prey  CC:  CAD.  History of Present Illness: The patient presents for followup of known coronary disease. Since I last saw her she has had continued chest discomfort. She doesn't recall her previous angina. She's not sure whether this is the same. She describes a "muscle spasm" that it is a cramping discomfort under her left breast and laterally. It may last for 30 minutes at a time. She has rarely taken a nitroglycerin. She does not describe associated symptoms, neck or arm discomfort. His resting she cannot bring it on. She continues to have palpitations also. These are unchanged. She did wear an event monitor. Note she has significant emotional stress in her life.  Current Medications (verified): 1)  Crestor 40 Mg Tabs (Rosuvastatin Calcium) .Marland Kitchen.. 1 By Mouth Daily 2)  Zetia 10 Mg Tabs (Ezetimibe) .... Take One Tablet By Mouth Daily. 3)  Isosorbide Mononitrate Cr 120 Mg Xr24h-Tab (Isosorbide Mononitrate) .Marland Kitchen.. 1 By Mouth Daily 4)  Fish Oil   Oil (Fish Oil) .Marland Kitchen.. 1 By Mouth Once Daily 5)  Furosemide 40 Mg Tabs (Furosemide) .Marland Kitchen.. 1 By Mouth Two Times A Day 6)  Lantus 100 Unit/ml Soln (Insulin Glargine) .... As Directed 7)  Atenolol 50 Mg Tabs (Atenolol) .... Take One and Half Tablet By Mouth Two Times A Day 8)  Zolpidem Tartrate 10 Mg Tabs (Zolpidem Tartrate) .... As Needed 9)  Alprazolam 0.5 Mg Tabs (Alprazolam) .... As Needed 10)  Lisinopril 20 Mg Tabs (Lisinopril) .Marland Kitchen.. 1 By Mouth Daily 11)  Plavix 75 Mg Tabs (Clopidogrel Bisulfate) .Marland Kitchen.. 1 By Mouth Daily 12)  Citalopram Hydrobromide 20 Mg Tabs (Citalopram Hydrobromide) .Marland Kitchen.. 1 By Mouth Daily 13)  Multivitamins   Tabs (Multiple Vitamin) .Marland Kitchen.. 1 By Mouth Daily 14)  Aspirin 325 Mg  Tabs (Aspirin) .Marland Kitchen.. 1 By Mouth Daily 15)  Humalog 100 Unit/ml Soln (Insulin Lispro (Human)) .... As Directed 16)  Potassium Chloride Cr 10 Meq Cr-Caps (Potassium Chloride) .... One  Daily 17)  Nitrostat 0.4 Mg Subl (Nitroglycerin) .Marland Kitchen.. 1 Tablet Under Tongue At Onset of Chest Pain; You May Repeat Every 5 Minutes For Up To 3 Doses. 18)  Janumet  (Sitagliptin-Metformin Hcl) .Marland Kitchen.. 1 Tab By Mouth Once Daily  Allergies (verified): 1)  ! Codeine 2)  ! * Ivp Dye 3)  ! Codeine 4)  ! * Ivp Dye  Past History:  Past Medical History: Reviewed history from 02/10/2010 and no changes required. HYPOKALEMIA (ICD-276.8) DIVERTICULITIS, COLON (ICD-562.11) MULTIPLE SCLEROSIS (ICD-340) (diagnosis not confirmed) DEPRESSION (ICD-311) PVD (ICD-443.9) DM (ICD-250.00) HYPERLIPIDEMIA (ICD-272.4) HYPERTENSION (ICD-401.9) CAD (ICD-414.00)(status post non STEMI with Taxus drug     eluting stent to the circumflex coronary artery June 2008.  She had     a non drug eluting stent placed to an left anterior descending     artery  in July as well as angioplasty to the  diagonal.  Diag 80% stenosis with PCI April 2011). CHEST PAIN (ICD-786.50) Mild carotid plaque  Past Surgical History: Reviewed history from 09/28/2009 and no changes required. Hysterectomy.  Appendectomy.  Cholecystectomy  Review of Systems       As stated in the HPI and negative for all other systems.   Vital Signs:  Patient profile:   60 year old female Height:      66 inches Weight:      194 pounds BMI:     31.43 Pulse rate:   70 /  minute Resp:     18 per minute BP sitting:   148 / 84  (right arm)  Vitals Entered By: Marrion Coy, CNA (November 22, 2010 2:09 PM)  Physical Exam  General:  Well developed, well nourished, in no acute distress. Head:  normocephalic and atraumatic Eyes:  PERRLA/EOM intact; conjunctiva and lids normal. Mouth:  Teeth, gums and palate normal. Oral mucosa normal. Neck:  Neck supple, no JVD. No masses, thyromegaly or abnormal cervical nodes. Chest Wall:  no deformities or breast masses noted Lungs:  Clear bilaterally to auscultation and percussion. Abdomen:  Bowel sounds  positive; abdomen soft and non-tender without masses, organomegaly, or hernias noted. No hepatosplenomegaly. Msk:  Back normal, normal gait. Muscle strength and tone normal. Extremities:  No clubbing or cyanosis. Neurologic:  Alert and oriented x 3. Skin:  Intact without lesions or rashes. Cervical Nodes:  no significant adenopathy Axillary Nodes:  no significant adenopathy Inguinal Nodes:  no significant adenopathy Psych:  Normal affect.   Detailed Cardiovascular Exam  Neck    Carotids: Carotids full and equal bilaterally without bruits.      Neck Veins: Normal, no JVD.    Heart    Inspection: no deformities or lifts noted.      Palpation: normal PMI with no thrills palpable.      Auscultation: regular rate and rhythm, S1, S2 without murmurs, rubs, gallops, or clicks.    Vascular    Abdominal Aorta: no palpable masses, pulsations, or audible bruits.      Femoral Pulses: normal femoral pulses bilaterally.      Pedal Pulses: normal pedal pulses bilaterally.      Radial Pulses: normal radial pulses bilaterally.      Peripheral Circulation: no clubbing, cyanosis, or edema noted with normal capillary refill.     Impression & Recommendations:  Problem # 1:  CAD (ICD-414.00) It is Ivery difficult to work her symptoms to understand but could be anginal.  At this point I will try to add Renexa to see if this helps with her pain.  She would be reluctant to have another catheterization or stress test.  Problem # 2:  CAROTID STENOSIS (ICD-433.10)  Problem # 3:  HYPERTENSION (ICD-401.9) Her BP is controlled and she will continue the meds as listed.  Other Orders: EKG w/ Interpretation (93000)  Patient Instructions: 1)  Your physician recommends that you schedule a follow-up appointment in: 1 month with Dr Antoine Poche 2)  Your physician has recommended you make the following change in your medication: Start Ranexa 500 mg one twice a day Prescriptions: RANEXA 500 MG XR12H-TAB  (RANOLAZINE) one twice a day  #60 x 6   Entered by:   Charolotte Capuchin, RN   Authorized by:   Rollene Rotunda, MD, [place]   Signed by:   Charolotte Capuchin, RN on 11/22/2010   Method used:   Electronically to        [facility]* (retail)       [address]       [place], [place]  [address]       Ph: [phone]       Fax: [phone]   RxID:   [id number]  I have reviewed and approved all prescriptions at the time of this visit. Rollene Rotunda, MD, [facility]  November 22, 2010 3:02 PM

## 2010-12-12 LAB — GLUCOSE, CAPILLARY
Glucose-Capillary: 212 mg/dL — ABNORMAL HIGH (ref 70–99)
Glucose-Capillary: 371 mg/dL — ABNORMAL HIGH (ref 70–99)
Glucose-Capillary: 434 mg/dL — ABNORMAL HIGH (ref 70–99)

## 2010-12-12 LAB — BASIC METABOLIC PANEL
CO2: 26 mEq/L (ref 19–32)
Calcium: 9.1 mg/dL (ref 8.4–10.5)
Chloride: 105 mEq/L (ref 96–112)
GFR calc Af Amer: >60 mL/min (ref >60)
GFR calc non Af Amer: >60 mL/min (ref >60)
Glucose, Bld: 186 mg/dL — ABNORMAL HIGH (ref 70–99)
Glucose, Bld: 332 mg/dL — ABNORMAL HIGH (ref 70–99)
Potassium: 4 mEq/L (ref 3.5–5.1)
Sodium: 134 mEq/L — ABNORMAL LOW (ref 135–145)
Sodium: 137 mEq/L (ref 135–145)

## 2010-12-12 LAB — CBC
HCT: 33.3 % — ABNORMAL LOW (ref 36.0–46.0)
Hemoglobin: 11.6 g/dL — ABNORMAL LOW (ref 12.0–15.0)
Hemoglobin: 11.7 g/dL — ABNORMAL LOW (ref 12.0–15.0)
MCHC: 34.7 g/dL (ref 30.0–36.0)
MCV: 92.1 fL (ref 78.0–100.0)
RDW: 14.5 % (ref 11.5–15.5)
RDW: 14.7 % (ref 11.5–15.5)

## 2010-12-13 LAB — POCT I-STAT GLUCOSE
Glucose, Bld: 155 mg/dL — ABNORMAL HIGH (ref 70–99)
Operator id: 122531

## 2010-12-16 ENCOUNTER — Encounter: Payer: Self-pay | Admitting: Cardiology

## 2010-12-27 ENCOUNTER — Encounter: Payer: Self-pay | Admitting: Cardiology

## 2010-12-27 ENCOUNTER — Ambulatory Visit (INDEPENDENT_AMBULATORY_CARE_PROVIDER_SITE_OTHER): Payer: Medicare Other | Admitting: Cardiology

## 2010-12-27 DIAGNOSIS — R0989 Other specified symptoms and signs involving the circulatory and respiratory systems: Secondary | ICD-10-CM

## 2011-02-02 ENCOUNTER — Encounter: Payer: Self-pay | Admitting: Family Medicine

## 2011-02-06 NOTE — H&P (Signed)
[name], [name]                 ACCOUNT NO.:  [id number]   MEDICAL RECORD NO.:  [id number]          PATIENT TYPE:  EMS   LOCATION:  [facility]JO                         FACILITY:  [facility]   PHYSICIAN:  Bevelyn Buckles. Bensimhon, MDDATE OF BIRTH:  06/06/1951   DATE OF ADMISSION:  02/09/2008  DATE OF DISCHARGE:                              HISTORY & PHYSICAL   PRIMARY CARDIOLOGIST:  [name].   PRIMARY CARE [name]:  [name] in [place].   PATIENT PROFILE:  A 60 year old Caucasian female with history of CAD,  who presents with 1 month history of dyspnea and  7-day history of chest  discomfort.   PROBLEMS:  1. Chest pain/coronary artery disease 1A.  Non-ST elevation MI, July      2008 with the catheterization and PCI of the left circumflex      placement TAXUS drug-eluting stent.  The LAD, which is the bare-      metal stent of the diagonal underwent balloon angioplasty in July      2008.  2. Hypertension x 20 years.  3. Hyperlipidemia x 10 years.  4. Diabetes mellitus x 5 years.  5. Peripheral vascular disease with 46% stenosis in the right internal      carotid artery, last checked in August 2008.  6. Depression.  7. Questionable history of multiple sclerosis.  8. History of diverticulitis.  9. Hypokalemia.  10.Status post hysterectomy.  11.Status post appendectomy.  12.Status post cholecystectomy.   HISTORY OF PRESENT ILLNESS:  A 60 year old Caucasian female with a  history of coronary artery disease, status post non-ST-elevation MI in  June 2008 with TAXUS drug-eluting stent placement to the left circumflex  and subsequent bare-metal stent into the LAD and PTCA of the D1.  She  was in her usual state of health until about 1 month ago, when she began  to note increasing lower extremity edema, dyspnea on exertion,  orthopnea, increasing from 2 pillows at night to 4 pillows at night.  She did not weigh herself at home.  For the past week, she has also had  intermittent chest pressure, which is mild-to-moderate in severity.  Present most of the day for at least 10 hours a day, worse with exertion  somewhat better with nitroglycerin and sitting up.  She saw Dr. [name]  this morning and it was noted that her weight was up 18 pounds over the  past month.  Chest x-ray possibly showed edema and she was sent to the  [facility] ED for further evaluation.  I hear she is pain-free.  Her  chest x-ray shows cardiac enlargement without active cardiopulmonary  disease.   ALLERGIES:  IV CONTRAST possibly the causes hypotension.  She is also  allergic to CODEINE.   HOME MEDICATIONS:  1. Actos 30 mg daily.  2. Zetia 10 mg daily.  3. Januvia 100 mg daily.  4. Plavix 75 mg daily.  5. Klor-Con 20 mEq q.i.d.  6. Aspirin 325 mg daily.  7. Imdur 120 mg daily.  8. Atenolol 75 mg b.i.d.  9. Cefuroxime 25 mg daily.  10.Alprazolam  0.5 mg t.i.d.  11.Zolpidem 10 mg daily or every night.  12.Lipitor 80 mg daily.  13.Citalopram 20 mg daily.  14.Nexium 40 mg daily.  15.Triamterene 25 mg daily.  16.Lantus 70-85 units every night.  17.Humalog 16 units t.i.d. before meals.  18.Lasix 40 mg daily.   FAMILY HISTORY:  Mother is alive at age 63 with the history of MI and  diabetes.  Father died of an MI at 21.  She has a brother, who has had 6  MIs and is status post CABG.  She has a sister, who is alive and well.   SOCIAL HISTORY:  She lives at [place] with her husband.  She is on  disability.  She denied tobacco, alcohol, or drug use.  She is not  routinely exercising.   REVIEW OF SYSTEMS:  Positive for 18-pound weight gain with associated 4-  pillow orthopnea and worsening lower extremity edema for the past month.  She has chronic dyspnea on exertion that occurs after walking about 25  feet.  She does have some urinary frequency since she has been back on  Lasix for the past month.  She has a history of diabetes.  She is on  multiple medications.  Otherwise,  all systems are reviewed and negative.   PHYSICAL EXAMINATION:  VITAL SIGNS:  Temperature 97.6, heart rate 66,  respirations 21, blood pressure 134/67. and pulse ox 100% on 2 liters.  GENERAL:  A pleasant white female, in no acute distress.  Awake, alert,  and oriented x 3.  HEENT:  Normal.  Nares grossly intact and nonfocal.  SKIN:  Warm and dry without lesions or masses.  NECK:  Obese with JVP of approximately 10-12 cm.  No bruits.  LUNGS:  Respirations are regular and unlabored, clear to auscultation.  CARDIAC:  Regular S1 and S2.  No S3, S4, or murmurs.  ABDOMEN:  Obese, soft, nontender, and nondistended.  Bowel sounds are  present.  EXTREMITIES:  Lower extremities; warm, dry, and pink.  No clubbing in  clinic with 2+ bilateral lower extremity edema.  Dorsalis pedis and  posterior tibialis 2+ equal bilaterally.   LABORATORY DATA:  Chest x-ray shows cardiac enlargement with normoactive  cardiopulmonary disease.  EKG shows sinus rhythm with the rate of 64,  normoactive.  No acute ST-T changes.  Hemoglobin 11.4, hematocrit 32.4,  WBC is 6.0 and platelets 109.  Sodium 141, potassium 3.5, chloride 106,  CO2 26, BUN 5, creatinine 0.87, glucose 118, AST 32, total protein 6.2,  albumin 3.2, cardiac markers negative x 1, and calcium 9.0.   ASSESSMENT & PLAN:  1. Chest pain associated with coronary artery disease.  The patient      has had somewhat constant chest pain that is worse with activity      over the past 7 days.  This is similar to her previous angina and a      pair of several cardiac markers.  ECG is without acute changes and      if enzymes remain negative, we could consider outpatient in      Myoview, although the patient is very hesitant with regards to      Myoview as she had a negative one just prior to her MI.  Continue      her home regimen of aspirin, beta blocker, and statin.  2. Dyspnea.  This is associated with increased weight, orthopnea, and      lower extremity  edema.  Suspect acute diastolic heart failure.  She      had previously normal left ventricular function.  Her blood      pressure and heart rate are well controlled.  Diuresis and plan on      outpatient echocardiogram.  3. Hypertension, stable.  4. Hyperlipidemia.  Continue statin.  5. Diabetes.  Continue home regimen with the exception of holding her      Actos in the setting of heart failure.  6. Hypokalemia supplement.      Nicolasa Ducking, ANP      Bevelyn Buckles. Bensimhon, MD  Electronically Signed    CB/MEDQ  D:  02/09/2008  T:  02/10/2008  Job:  [id number]

## 2011-02-06 NOTE — Consult Note (Signed)
NAMETYLEAH, [name]                 ACCOUNT NO.:  [id number]   MEDICAL RECORD NO.:  [id number]          PATIENT TYPE:  INP   LOCATION:  6532                         FACILITY:  [facility]   PHYSICIAN:  Veverly Fells. Altheimer, M.D.DATE OF BIRTH:  Feb 15, 1951   DATE OF CONSULTATION:  04/16/2007  DATE OF DISCHARGE:                                 CONSULTATION   ADDENDUM:   MEDICATIONS:  1. Diovan 320 mg daily.  2. Maxzide 37.5/25 mg daily.  3. Plavix 75 mg daily.  4. Lasix 20 mg daily.  5. Aspirin 81 mg daily.  6. Vytorin 10/80 mg daily.  7. Nexium 40 mg daily.  8. Atenolol 75 mg b.i.d.  9. NovoLog 70/30 45 units b.i.d.  10.Humalog scale as above.  11.Ambien 10 mg h.s. p.r.n.  12.Xanax p.r.n.  13.Nitroglycerin p.r.n.      Veverly Fells. Altheimer, M.D.  Electronically Signed     MDA/MEDQ  D:  04/16/2007  T:  04/17/2007  Job:  [id number]

## 2011-02-06 NOTE — Discharge Summary (Signed)
[name], Karen                 ACCOUNT NO.:  [id number]   MEDICAL RECORD NO.:  [id number]          PATIENT TYPE:  INP   LOCATION:  4741                         FACILITY:  [facility]   PHYSICIAN:  Dorian Pod, ACNP  DATE OF BIRTH:  04/03/1951   DATE OF ADMISSION:  02/09/2008  DATE OF DISCHARGE:  02/10/2008                               DISCHARGE SUMMARY   PRIMARY CARE PHYSICIAN:  Ernestina Penna, M.D.   PRIMARY CARDIOLOGIST:  Rollene Rotunda, M.D., [facility]   DISCHARGING DIAGNOSES:  1. New-onset congestive heart failure, unclear etiology at this time.      The patient pending outpatient 2-D echocardiogram follow up with      Dr. Antoine Poche.  2. Dyspnea, most likely secondary to new-onset congestive heart      failure.  3. Chest discomfort, negative cardiac workup with cardiac enzymes and      12-lead EKG, this admission.  4. History of coronary artery disease status post non-ST elevated MI      with a drug-eluting stent to the circumflex in June 2008 and non-      drug eluting stent to the LAD with percutaneous transluminal      coronary angioplasty to the diagonal in July 2008.  5. Well-preserved ejection fraction by cardiac catheterization in      2008.  6. Diabetes.  7. Hypertension.  8. Dyslipidemia.   [name] is a 60 year old Caucasian female who presented on day of  admission complaining of chest discomfort and dyspnea.  She was seen by  her primary care physician's office earlier in the day with a noted 18-  pound weight gain, unclear for what period of time.  [name]  complained of chest  pressure and increased dyspnea.  She was referred  to [facility] for further evaluation, seen by Christain Sacramento, nurse  practitioner and Nicholes Mango.  The patient was placed on IV Lasix with  good response.  She had approximately a 5-pound weight loss in 24 hours  with a weight at time of discharge 202 pounds.  BNP mildly elevated to  192.  Cardiac enzymes negative.  [name] has a  history of hypokalemia  with unclear etiology, pending further workup by Dr. Antoine Poche.  She has  remained on potassium supplements at home.  Her potassium is 3.7 at time  of discharge, BUN, creatinine 7 and 1.0, H&H 11.6 and 32.6.  She has  been in sinus rhythm on the monitor.  Cardiac enzymes negative x3 sets.  Total cholesterol 116, triglycerides 75, LDL 63, and HDL 38.  [name]  has responded well to diuretic therapy and is being discharged home  after ambulating in the hall without experiencing chest discomfort or  shortness of breath.  Dr. Nicholes Mango has examined the patient and  agrees.  [name] will be discharged home with increased dose of  diuretics.   MEDICATIONS:  Her medications at time of discharge include:  1. Lasix 40 mg p.o. b.i.d.  2. K-Dur 40 mEq p.o. b.i.d.  3. Aspirin 325.  4. Plavix 75 daily.  5. Zetia  10 mg daily.  6. Januvia 100 mg daily.  7. Isosorbide 120 mg daily.  8. Atenolol, the patient is to take 75 mg daily as previously      prescribed.  9. Xanax as previously prescribed.  10.Lipitor 80 mg daily.  11.Celexa 20 mg daily.  12.Actos 30 mg daily.  13.Nexium 40 mg daily.  14.Lantus insulin as previously prescribed.   She has not been given her Maxzide or metolazone, while she has been in  the hospital.  She states she has not been taking metolazone at home  anyway.  Both of these medications have been put on hold till the  patient follows up with Dr. Antoine Poche.  There is a question whether or  not the patient may need spironolactone for further management.  I have  scheduled [name] an appointment to have a 2D echocardiogram done  along with a BNP and BMET  on Feb 18, 2008 at 1:00 p.m. at the [facility].  She will then follow up with Dr. Antoine Poche on March 10, 2008 at 3:15 p.m. and at [facility], which is  more convenient for her.  She has been given the CHF teach and including  a stronger booklet.  She is to  call the office, if she experiences any  increased shortness of breath, weight gain, peripheral edema, or  symptoms suggestive of volume overload.   Duration of discharge encounter greater than 30 minutes.      Dorian Pod, ACNP     MB/MEDQ  D:  02/10/2008  T:  02/11/2008  Job:  [id number]   cc:   Ernestina Penna, M.D.

## 2011-02-06 NOTE — Assessment & Plan Note (Signed)
[facility]                            CARDIOLOGY OFFICE NOTE   PEIGHTON, MEHRA                          MRN:          [id number]  DATE:03/18/2007                            DOB:          Jun 11, 1951    PRIMARY:  Dr. Vernon Prey   REASON FOR PRESENTATION:  Patient with shortness of breath,  palpitations.   HISTORY OF PRESENT ILLNESS:  The patient presents for followup after an  appointment in May at which time she reported shortness of breath and  chest discomfort.  I did send her for a stress perfusion study which  demonstrated an EF of 80%. There was some mild decrease in uptake at the  apex but it was overall low risk scan.  She continues to have fatigue.  She also has an episode of her heart flipping and flopping.  She does  not have any presyncope or syncope associated with this; however, it is  happening with increasing frequency.  It is happening 3 or 4 times a  week. It is not sustained.  It is not brought on by any particular  activity.  She does not have any chest discomfort particularly with  this.  She continues to have the chest discomfort as described in the  previous note.   PAST MEDICAL HISTORY:  Diabetes mellitus x5 years (hemoglobin A1C last  was 10.2%), hypertension x20 years, hyperlipidemia x10 years, depression  x20 years, questionable multiple sclerosis, bilateral carotid artery  stenosis (less than 39% in February 2006), diverticulitis, hysterectomy,  appendectomy, cholecystectomy, child birth.   ALLERGIES:  CODEINE and IV CONTRAST DYE.   MEDICATIONS:  1. Novolin.  2. Diovan 320 mg daily.  3. Maxzide.  4. Plavix 75 mg daily.  5. NovoLog.  6. Celexa.  7. Ambien.  8. Aspirin.  9. Atenolol 100 mg daily.  10.Vytorin 10/80 daily.  11.Lorazepam b.i.d.  12.Nexium 40 mg daily.   REVIEW OF SYSTEMS:  As stated in the HPI and otherwise negative for  other systems.   PHYSICAL EXAMINATION:  The patient is in no distress.  Her blood pressure 158/92, heart rate 70 and regular.  Weight 184  pounds.  Body mass index 29.  HEENT:  Eyes unremarkable.  Pupils equal, round and reactive to light.  Fundi not visualized, oral mucosa unremarkable.  NECK:  No jugular venous distention at 45 degrees.  Carotid upstroke  brisk and symmetric, no bruits, no thyromegaly.  LYMPHATICS:  No cervical, axillary, inguinal adenopathy.  LUNGS:  Clear to auscultation bilaterally.  BACK:  No costovertebral angle tenderness.  CHEST:  Unremarkable.  HEART:  PMI not displaced or sustained, S1 and S2 within normal limits  with no S3, no S4, no clicks, no rubs, no murmurs.  ABDOMEN:  Obese, positive bowel sounds.  Normal in frequency and pitch.  No bruits, no rebound, no guarding __________ no mass, no hepatomegaly,  splenomegaly.  SKIN:  No rashes.  No nodule.  EXTREMITIES:  There are 2+ upper pulses, 1+ femorals, 1+ dorsalis pedis  and posterior tibialis bilaterally, mild bilateral lower extremity  edema, no cyanosis,  no clubbing.  NEURO:  Oriented to person, place and time.  Cranial nerves II through  XII grossly intact, motor grossly intact.   ASSESSMENT AND PLAN:  1. Chest discomfort.  The patient's chest discomfort does not seem to      have a cardiovascular etiology.  This is a very low risk scan.  I      would not suggest further cardiovascular testings such as      catheterization for evaluation of this.  She should return back to      her primary care doctor to evaluate possible non-anginal      etiologies.  2. Dyspnea.  We are going to get a BNP level and then I will have a      low threshold to get an echocardiogram.  3. Palpitations.  She will get a 2 week event monitor.  4. Hypertension.  This is elevated today although at home it is not      elevated.  She reports that the home readings are in the 130s over      80s often.  She will continue on the medications as listed for now.      I will evaluate it with lab draw as  reported below.  5. Difficult to control diabetes.  Will check a cortisol level.  6. Followup and see her back based on the results of the above.     Rollene Rotunda, MD, [facility]  Electronically Signed    JH/MedQ  DD: 03/18/2007  DT: 03/19/2007  Job #: [id number]   cc:   Ernestina Penna, M.D.

## 2011-02-06 NOTE — Consult Note (Signed)
NAMECHRISTASIA, [name]                 ACCOUNT NO.:  [id number]   MEDICAL RECORD NO.:  [id number]          PATIENT TYPE:  INP   LOCATION:  6532                         FACILITY:  [facility]   PHYSICIAN:  Veverly Fells. Altheimer, M.D.DATE OF BIRTH:  09/29/1950   DATE OF CONSULTATION:  04/16/2007  DATE OF DISCHARGE:  04/17/2007                                 CONSULTATION   ENDOCRINOLOGY CONSULTATION:   REASON FOR CONSULTATION:  Uncontrolled diabetes and uncontrolled  dyslipidemia.   HISTORY:  This is a 60 year old white female with diabetes mellitus type  2 diagnosed in about 2003.  She reports that she was only on metformin  for about the first 3 years, which resulted in some diarrhea.  She was  changed to insulin in about 2006.  She has never been on combination  therapy.  At one time she was possibly on Byetta which was stopped,  possibly due to nausea.  Her most recent regimen has been NovoLog 70/30  with 45 units twice daily before meals supplemented by Humalog 4 units  when above 250 and 10 units when above 350.  Hemoglobin A1c has  indicated very poor control with levels of 9.7% in December of 2007 and  10.2% in May 2008.  A1c is currently pending here.  She states that her  home self-blood glucose monitor is usually in the 200s to 300s, although  she has had many recent readings since her recent myocardial infarction  in the 400s and even some in the 500s with no specific action taken to  correct this.  She has recently been testing 3 or 4 times daily at home.  She has only had 1 hypoglycemic episode since she was diagnosed, that  being a long time ago.   Her CBGs and lab glucoses on admission 2 days ago and on through  yesterday were in the 300s to 500s.  Her cardiac catheterization was  deferred until her glucoses were uncontrolled to under 200 with  Glucommander and IV insulin.  Her CBG this morning before breakfast was  160 and is up to 270 again before supper today.   She also  has severe dyslipidemia with LipoProfile in May 2008 showing a  highly atherogenic dyslipidemic pattern.  She previously was on Lipitor  and Zetia which she says was changed to Vytorin because the former  regimen was not working, but on further questioning, she was taking  those lipid lowering agents no more than 3 times a week.  When her  LipoScience Profile was done in May, she was taking the Vytorin  sporadically at best.   She has a history of coronary disease, status post recent non-ST-  elevation myocardial infarction treated with a drug-eluting stent to the  circumflex with residual left anterior descending disease that was  treated medically initially.  She presented recently with continued  fatigue and exertional chest pressure associated with shortness of  breath, left arm pain, and nausea mainly with exertion.  She was  hospitalized for catheterization and intervention.  Yesterday she  underwent cardiac catheterization and had a PTCA and stent  to the left  anterior descending and also had PTCA to the diagonal.  Ejection  fraction was noted to be greater than 70%.   It was also noted that she has no history of CHF.  Her ejection fraction  on the catheterization yesterday was greater than 70%, and her BNP this  admission was 72.   PAST MEDICAL HISTORY:  1. Diabetes mellitus type 2, out of control as above.  2. Dyslipidemia as above.  3. Obesity.  4. Hypertension.  5. Atherosclerotic heart disease as above.  6. Carotid artery occlusive disease.  7. Status post hysterectomy.  8. Status post appendectomy.  9. Status post cholecystectomy.  10.Prior consideration of multiple sclerosis by an MRI scan, but she      reports that this was not confirmed by lumbar puncture on 2      different occasions.   DRUG SENSITIVITIES:  CODEINE and IV CONTRAST.   MEDICATIONS:  1. Diovan 320 mg daily.  2. Maxzide 37.5/25 mg daily.  3. Plavix 75 mg daily.  4. Lasix 20 mg daily.  5.  Aspirin 81 mg daily.  6. Vytorin 10/80 mg daily.  7. Nexium 40 mg daily.  8. Atenolol 75 mg b.i.d.  9. NovoLog 70/30 45 units b.i.d.  10.Humalog scale as above.  11.Ambien 10 mg h.s. p.r.n.  12.Xanax p.r.n.  13.Nitroglycerin p.r.n.   FAMILY HISTORY:  Is strongly positive for diabetes,  hypertension, and  atherosclerotic heart disease.   SOCIAL HISTORY:  She lives with her husband.  She has never smoked.  She  does not drink alcohol.  Her husband had coronary artery bypass surgery  a few months ago and was on disability.  She states that her prior  compliance with medications was significantly limited by financial  issues, although they have inherited some money and this should no  longer be a significant limiting factor with her medication acquisition.   REVIEW OF SYSTEMS:  She has a mild headache, otherwise currently  negative.  She denies any GI, GU, or neurological symptoms.  CARDIOPULMONARY:  As above.   LAB:  Notable for glucoses as above.  BUN and creatinine this morning  were 27 and 1.27 with estimated GFR of 43.  WBC was 11.9 and hemoglobin  10.6.  BNP was 72 which is normal.  Lipids have not been checked this  admission, but review of LipoScience Profile from Feb 06, 2007 includes  findings of LDL particle  number 2603 which is very high, with a very  high level of small LDL particles, total LDL severely elevated at 187,  HDL 43, triglycerides 126, a total cholesterol 255.   ASSESSMENT:  1. Diabetes mellitus type 2, diagnosed about 2003, chronically and      very poor control, insulin requiring and currently treated with      insulin alone.  2. Dyslipidemia with very high levels of LDL, LDL particle number, and      small dense LDL.  3. Obesity.  4. Hypertension.  5. Atherosclerotic heart disease status post non-ST-elevation      myocardial infarction March 21, 2007, treated with drug-eluting      stent to the circumflex with residual left anterior descending       disease, treated yesterday with angioplasty and stent as well as      angioplasty to the diagonal.  6. Preserved left ventricular function.  7. 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  8. Questionable history of multiple sclerosis, not confirmed by lumbar      punctures.   RECOMMENDATIONS:  She of course needs much more aggressive risk factor  modification, with aggressive combination therapy of glucose and lipids.  She was actually already scheduled to see me in consultation in the  office in early September.  I advised the patient regarding the need for  very aggressive risk factor modification.  We will defer her blood  pressure medications to Cardiology as it is noted that she was  hypotensive earlier this morning.  As far as her glucose control, we  will discontinue the NovoLog 70/30 insulin and begin Lantus 80 units  q.h.s., NovoLog with meal coverage plus a correction scale.  We will  also start Actos 30 mg daily and Januvia 100 mg daily.  She will  continue self glucose monitoring at least 4 times daily.  She needs  ongoing diabetes education.  We will request a nutrition consult.  Regarding the dyslipidemia, we will stop the Vytorin and put her back on  Lipitor at a higher dose of 80 mg daily plus Zetia 10 mg daily, Tricor  145 mg daily, and Lovaza 4 g daily.  The importance of full adherence to  all medications was emphasized to her, and, as noted, this should be  easier now that financial obstacles are no longer a major issue for her.  We will check lipid profile, thyroid profile, 25-vitamin D, urinalysis,  and microalbumin.  I will see her for a follow-up visit tomorrow morning  and anticipate seeing her regularly in the office for followup of these  metabolic issues and ongoing cardiovascular risk factor modification.   Thank you for asking  me to be involved in her care.      Veverly Fells. Altheimer, M.D.  Electronically Signed     MDA/MEDQ  D:  04/16/2007  T:  04/17/2007  Job:  [id number]   cc:   Rollene Rotunda, MD, [facility]  Maisie Fus D. Riley Kill, MD, [facility]  Ernestina Penna, M.D.

## 2011-02-06 NOTE — Discharge Summary (Signed)
[name], [name]                 ACCOUNT NO.:  [id number]   MEDICAL RECORD NO.:  [id number]          PATIENT TYPE:  INP   LOCATION:  6532                         FACILITY:  [facility]   PHYSICIAN:  Arturo Morton. Riley Kill, MD, FACCDATE OF BIRTH:  1950/11/12   DATE OF ADMISSION:  04/14/2007  DATE OF DISCHARGE:  04/17/2007                               DISCHARGE SUMMARY   PROCEDURES:  1. Cardiac catheterization.  2. Coronary arteriogram.  3. Left ventriculogram.  4. Percutaneous transluminal coronary angioplasty and stent to one      vessel, percutaneous transluminal coronary angioplasty to a second      vessel.  5. Residual coronary artery disease of 90% in a septal perforator,      small vessel, medical therapy recommended, as well as 30% in the      proximal circumflex and 30-40% in the proximal right coronary      artery.  6. Preserved left ventricular function with an ejection fraction of      70% at catheterization this admission.  7. Non-ST-segment-elevation myocardial infarction March 21, 2007, with      a Taxus stent to the circumflex.  8. Diabetes.  9. Hypertension.  10.Hyperlipidemia.  11.Cerebrovascular disease with a 40-60% right internal carotid      artery.  12.Depression.  13.Possible history of multiple sclerosis.  14.History of diverticulitis.  15.Status post hysterectomy, appendectomy, cholecystectomy.  16.Allergy or intolerance to IMDUR and PLATO STUDY DRUG with rash, as      well as CODEINE and INTRAVENOUS DYE.  17.Family history of coronary artery disease.  18.Obesity.   TIME AT DISCHARGE:  43 minutes   HOSPITAL COURSE:  [name] is a 60 year old female with known coronary  artery disease.  She was recently hospitalized for non-STEMI and  received a Taxus stent to the circumflex.  She began having exertional  chest pain and was admitted for further evaluation.   Her EKG was slightly different and it was felt that a re-look  catheterization was indicated.  She  was taken to the laboratory on April 15, 2007, for recurrent pain.  The cardiac catheterization showed a 70-  80% LAD which was treated with PTCA and a bare-metal stent, reducing the  stenosis to less than 10%.  She also had PTCA to the 80% diagonal,  reducing that stenosis to 30%.  The circumflex stent was widely patent  and her EF was normal.   She had some episodes of hypotension and her Diovan and her Maxzide are  currently on hold.  She otherwise tolerated it well.  She was seen by  cardiac rehab and an endocrine consult was called because of poor blood  sugar control.   She was seen by Dr. Leslie Dales and her medications were adjusted.  The  patient was advised of the changes and given prescriptions for the new  drugs.  She is also to follow up with Dr. Kathi Der office as well.  She  was evaluated by Dr. Riley Kill and Dr. Leslie Dales and considered stable for  discharge on an increased dose of atenolol, with outpatient follow-up  arranged.   DISCHARGE INSTRUCTIONS:  1. Activity level is to be increased gradually.  2. She is to call our office for any problems with the catheterization      site.  3. She is to stick to a low-sodium diabetic diet.  4. She is to follow up with Dr. Antoine Poche on August 27 at 9:30 a.m.  5. She is to follow up with Dr. [name] and Dr. Leslie Dales as well.   DISCHARGE MEDICATIONS:  1. Diovan 320 mg daily, hold today only.  2. Maxzide 37.5/25, hold today only.  3. Plavix 75 mg daily.  4. Aspirin 325 mg daily.  5. Nexium 40 mg daily.  6. Atenolol 50 mg one-and-a-half tablets b.i.d.  7. Celexa 20 mg a day.  8. Ambien 10 mg q.h.s. p.r.n.  9. Lantus 60 units daily.  10.Humalog 4 units t.i.d. a.c.  11.Lovaza 1 g two tablets daily.  12.Lipitor 80 mg a day.  13.Zetia 10 mg a day.  14.Actos 30 mg a day.  15.Januvia 100 mg daily.      Theodore Demark, PA-C      Arturo Morton. Riley Kill, MD, [facility]  Electronically Signed    RB/MEDQ  D:  04/17/2007  T:  04/17/2007   Job:  [id number]   cc:   Veverly Fells. Altheimer, M.D.  Ernestina Penna, M.D.

## 2011-02-06 NOTE — Cardiovascular Report (Signed)
[name], [name]                 ACCOUNT NO.:  [id number]   MEDICAL RECORD NO.:  [id number]          PATIENT TYPE:  INP   LOCATION:  6532                         FACILITY:  [facility]   PHYSICIAN:  Arturo Morton. Riley Kill, MD, FACCDATE OF BIRTH:  Apr 30, 1951   DATE OF PROCEDURE:  04/15/2007  DATE OF DISCHARGE:                            CARDIAC CATHETERIZATION   INDICATIONS:  Ms. Digeronimo is a 53 and with color and also the JP Hochrein  and also done more   INDICATIONS:  Ms. Fiero is well-known to me.  She previously presented  with a non-ST-elevation myocardial infarction, and had a subtotally  occluded circumflex coronary artery which was treated with a drug-  eluting stent.  She had residual disease in both the LAD and diagonal as  well septal perforator, but we elected to treat this medically in large  part due to the small vessel size and the potential requirement for a  non drug-eluting platform in a diabetic.  As a result, she was treated  medically.  She has had some recurrent symptoms.  She has also had very  poor sugar control, and today required a glucomander actually bring her  under 300.  She was treated throughout the day, and as her sugar  approached less than 300, we elected to bring her to the laboratory for  further evaluation.  She has had recurrent ischemic type chest pain.  All of her enzymes are negative.   PROCEDURE:  1. Left heart catheterization  2. Selective coronary arteriography.  3. Selective left ventriculography.  4. PTCA and stenting of the left anterior descending artery with a non      drug-eluting stent.  5. PTCA of the diagonal.   DESCRIPTION OF PROCEDURE:  The patient was brought to the  catheterization laboratory and prepped and draped in usual fashion.  Through an anterior puncture the left femoral artery was easily entered.  A 6-French sheath was then placed.  We used a JL-3O guiding catheter  then to highlight the left coronary artery.  This  demonstrated widely  patent stent and continued patency of the LAD and diagonal, albeit with  disease as previously noted.  The right coronary artery was then  injected this was followed by central aortic and left ventricular  pressures and ventriculography in the RAO projection.  I then discussed  the possible options.  We elected to stent the LAD.  The patient was  given Bivalirudin and previously had had Plavix and aspirin.  The LAD  was crossed with a Prowater wire and then predilated with a 2-mm  balloon.  After appropriate sizing, a 23 x 20 mini Vision non drug-  eluting stent was passed to the site, and deployed at about 13-14  atmospheres.  A 225 Quantum Maverick was then placed inside the stent,  and aggressive post dilatation done up to about 12 atmospheres.  There  was marked improvement in the artery with a step-up and step-down.  The  final stenosis was less than 10%.  We then addressed the diagonal.  The  diagonal has a fairly short area  of disease and then is somewhat larger  distally.  However, he had after intracoronary nitroglycerin  administration, it did not appear to be the size that would tolerate a  2.5-mm drug-eluting stent fully deployed.  As a result we elected to  dilate this.  This was gently dilated with modest pressure inflations  with a fairly marked improvement in the appearance of the artery.  In  order to preserve the possibility of de novo stenting with smaller drug-  eluting stent, once they are approved potentially, we elected not to  stent this area because of the marked increase risk and target vessel  revascularization associate potentially with the stenting, otherwise, a  smooth final result.  After this, all catheters were subsequently  removed and the femoral sheath was sewn into place.  I reviewed the  studies with her husband.  There were no complications.  ACT was  appropriate for the percutaneous intervention.   HEMODYNAMIC DATA.:  1.  Central aortic pressure 121/69, mean 93.  2. Left ventricular pressure 123 over 10.  3. No gradient or pullback across the aortic valve.   ANGIOGRAPHIC DATA.:  1. Ventriculography done in the RAO projection reveals vigorous      cholecystotic function with an ejection fraction of greater than      70%.  2. The right coronary artery has about 30-40% proximal narrowing.      There is mild luminal irregularity throughout and into the PDA and      PLA but without high-grade stenosis.  3. The left main is free of critical disease.  4. The LAD courses to the apex.  After the takeoff of the major      diagonal branch, there is about a 70% and 80% area of focal      stenosis in what appears to be about a 2 mm vessel.  Following      stenting, the vessel was stented to a 2.25 mm vessel using a high-      pressure balloon.  There was a step-up, step-down with a minus 10%      stenosis.  The remainder of the vessel was without critical      narrowing.  There was no evidence of edge tear.  The diagonal      itself had about 80% narrowing and two tandem lesions, and with a      15-mm balloon, this was reduced to about 20-30% with a smooth      angiographic result.  The septal perforator is relatively small and      has a 90% mid stenosis.  5. The circumflex has 30% proximal narrowing and then has a previously      placed drug-eluting Taxus stent in the mid vessel with minus 10%      stenosis.   CONCLUSION:  1. Well-preserved at vigorous global systolic function.  2. Continued patency of the drug-eluting stent in the circumflex      artery.  3. Deployment of a non drug-eluting stent in the left anterior      descending artery due to small vessel size (drug-eluting stents are      not available and less than 2.5 mm sizing in [place]).  4. Successful percutaneous stenting of the left anterior descending      artery.  5. Successful percutaneous angioplasty of the modest size diagonal.    DISPOSITION:  The patient will be treated aggressively.  We need to  improve on her glucose control.  I have discussed her case with Dr.  Antoine Poche, and Dr. [name] has made arrangements for her to be seen by Dr.  Leslie Dales who will try to consult him in the hospital.      Arturo Morton. Riley Kill, MD, [facility]  Electronically Signed     TDS/MEDQ  D:  04/15/2007  T:  04/16/2007  Job:  [id number]   cc:   CV Laboratory  Rollene Rotunda, MD, [facility]  Ernestina Penna, M.D.

## 2011-02-06 NOTE — Discharge Summary (Signed)
NAMEMERRIT, WAUGH                 ACCOUNT NO.:  [id number]   MEDICAL RECORD NO.:  [id number]          PATIENT TYPE:  INP   LOCATION:  4731                         FACILITY:  [facility]   PHYSICIAN:  Arturo Morton. Riley Kill, MD, FACCDATE OF BIRTH:  1951/01/04   DATE OF ADMISSION:  03/21/2007  DATE OF DISCHARGE:  03/24/2007                               DISCHARGE SUMMARY   PRIMARY CARDIOLOGIST:  [name]   PRIMARY CARE PHYSICIAN:  [name]   PROCEDURES PERFORMED DURING HOSPITALIZATION:  1. Cardiac catheterization completed on March 21, 2007 revealing:        A:  Left main fairly smooth and free of critical disease although  site was smaller in caliber.  Left anterior descending artery courses to  the apex after the takeoff of the diagonal branch.  There is evidence of  70% narrowing followed by an 80% area of segmental plaquing to the mid-  distal LAD, this vessel is probably between 2 and 2.5 in diameter in  terms of minimum luminal diameter.  Overall, the diagonal itself has  about 70% stenosis and the septal perforator has 90% and actually has  reduced distal flow with evidence of some suggestion of  collateralization from the distal septal perforators.  There is a ramus  intermedius without critical narrowing.  The AV circumflex provides the  atrial branch and then a small AV branch and then is followed by a  subtotal occlusion with TIMI-2 flow.  Following balloon dilatation, the  99% stenosis is reduced to 0% and restoration of TIMI-3 flow with a  slight step-up and stepdown and no evidence of residual stenosis.  The  right coronary artery has 20% mid-stenosis and minimal luminal  irregularity distally and is a large caliber vessel without critical  narrowing.        B:  Well-preserved overall left ventricular function.  Subtotal  occlusion of the circumflex coronary artery with TIMI-2 flow with  subsequent urgent balloon stent intervention using a drug-eluting  platform.   DISCHARGE DIAGNOSES:  1. Status post non-ST-elevated myocardial infarction with circumflex      culprit vessels status post drug-eluting stent on June 27.  2. Remedial LAD disease, SCPD resolved, multiple cardiac risk factors.  3. Diabetes.  4. IVP ALLERGY.  5. PLATO Study drug, now discontinued secondary to rash.  6. History of transient ischemic attacks versus musculoskeletal.  7. History of headache.   HISTORY OF PRESENT ILLNESS:  This is a 60 year old Caucasian female who  presented to [facility] Emergency Room with complaints of chest pain and  positive cardiac enzymes, was seen and examined by Dr. Willa Rough and  transferred emergently to [facility] for cardiac  catheterization.  The patient had complaint of chest pain acutely.  Enzymes were found to be elevated at 23.07, troponin level.  The patient  agreed to proceed with procedure and was subsequently transferred.   The patient was seen and examined by Dr. Charlies Constable in the cardiac  catheterization lab and found to have a very tight circumflex with 99%  stenosis noted with angioplasty, and  drug-eluting stent placed with 99%  stenosis reduced to 0% and restoration of TIMI-3 flow.  The patient was  enrolled in the PLATO Drug Trial, however with use of study drug the  patient did develop a macular rash on her back; therefore, the patient  was removed from drug study.  The patient was initially started on Imdur  60 mg p.o. q.day, however this was discontinued as well, as this was not  certain to have been contributing to the patient's rash.  The patient  was followed closely over the weekend during her hospitalization.  The  patient did well without any evidence of ventricular ectopy or  recurrence of chest discomfort during her hospitalization.  The patient  had pretty severe headache through her hospitalization and again Imdur  was thought to be culprit and was discontinued.   The patient was seen and  evaluated by Dr. Bonnee Quin on day of  discharge.  Patient was pain free.  The patient did continue to have a  rash, but it has gotten some better with discontinuation of medications.  The patient did have a carotid study completed prior to discharge with  results pending.  The patient was anxious to return home and was found  stable to do so after carotid study was completed.  Preliminary report  revealed bilateral no significant plaque visualized, right 40 to 60% ICA  stenosis lowest end of scale, increased velocities may be secondary to  tortuosity, no ICA stenosis per ultrasound technologist.  Official  reading is pending.   DISCHARGE LABS:  Sodium 132, potassium 3.9, chloride 95, CO2 29, glucose  190, BUN 17, creatinine 77.  Hemoglobin 12.1, hematocrit 34.9, white  blood cells 11.6, platelets 145.  Troponin 23.07 with subsequent  troponin of 15.53.  CK-MB 31.3.  Creatinine kinase of 832.  Urinalysis  was negative for UTI.   Chest x-ray dated March 22, 2007 revealed low lung volumes with mild left  lower lobe atelectasis versus scarring.   EKG on March 22, 2007 revealed normal sinus rhythm with T wave inversion  noted laterally leads V5 and V6 and lead 1 with T wave flattening noted  in lead 2.   DISCHARGE MEDICATIONS:  1. Xanax 0.5 mg one p.o. q.8 hours p.r.n.  2. Diovan 320 mg one p.o. daily.  3. NovoLog insulin 70/30, thirty units in the a.m. and in p.m.  4. Celexa 20 mg once a day.  5. Ambien 10 mg daily.  6. Aspirin 81 mg daily.  7. Atenolol 75 mg b.i.d. (new dose).  8. Vytorin one p.o. q.day.  9. Maxzide 25/3.75 p.o. q.day.  10.Plavix 75 mg daily.  11.Sublingual nitroglycerin p.r.n. chest pain.   ALLERGIES:  TO CODEINE.   FOLLOWUP PLANS AND APPOINTMENTS:  1. The patient will follow with Dr. Antoine Poche or his physician      assistant on April 14, 2007 at 3:15.  2. The patient has been advised to not continue PLATO Study drug and     begin Plavix one p.o. q.day.  She  has also been advised to take      atenolol at higher dose of 75 mg twice a day from the 50 mg twice a      day she had been on previously; new prescriptions were provided.  3. The patient has been provided with post cardiac catheterization      instructions with particular emphasis on the      right groin site without evidence of bleeding, hematoma or  infection.  4. The patient has been given another prescription for sublingual      nitroglycerin p.r.n. chest pain.   Time spent with the patient to include physician time:  50 minutes.      Bettey Mare. Lyman Bishop, NP      Arturo Morton. Riley Kill, MD, [facility]  Electronically Signed    KML/MEDQ  D:  03/24/2007  T:  03/24/2007  Job:  [id number]   cc:   Ernestina Penna, M.D.

## 2011-02-06 NOTE — Assessment & Plan Note (Signed)
[facility]                            CARDIOLOGY OFFICE NOTE   MOLLYE, GUINTA                        MRN:          [id number]  DATE:06/25/2007                            DOB:          June 16, 1951    PRIMARY CARE PHYSICIAN:  Dr. Vernon Prey.   REASON FOR PRESENTATION:  Evaluate patient with coronary disease.   HISTORY OF PRESENT ILLNESS:  The patient presents for follow up of the  above. Since I last saw her, she has had much less chest discomfort. She  will occasionally get this discomfort, but it is not as bothersome as it  was prior to initiating 60 mg of Imdur. It is a sporadic discomfort.  Shooting. It is unchanged from previous although it is less frequent and  less intense. She cannot bring it on. There is no associated symptoms.  There is no nausea, vomiting, or diaphoresis. She does not have any new  palpitations, pre-syncope, or syncope. She does not have any shortness  of breath. She has been exercising and does rehab three days a week. She  has had some problems with her blood sugar running low.   PAST MEDICAL HISTORY:  Diabetes mellitus x5 years, hypertension x20  years, hyperlipidemia x10 years, coronary artery disease (Status post  nonSTEMI with a Taxus DES to the circumflex coronary artery in June  2008. She had a non-DES stent placed to an LAD in July, as well as  angioplasty of a diagonal.), well preserved ejection fraction, 40% to  60% right internal carotid artery plaque, depression, questionable  multiple sclerosis, diverticulitis, hysterectomy, appendectomy, and  cholecystectomy.   ALLERGIES:  CODEINE and CONTRAST DYE.   CURRENT MEDICATIONS:  1. Fish oil.  2. Imdur 60 mg daily.  3. Januvia.  4. Actos 30 mg daily.  5. Zetia 10 mg daily.  6. Lipitor 80 mg daily.  7. Lantus.  8. Atenolol 150 mg daily.  9. Aspirin 325 mg daily.  10.Nexium.  11.Ambien 10 mg nightly.  12.Celexa 20 mg daily.  13.Plavix 75 mg daily.  14.Maxzide 37.5/25 mg daily.   REVIEW OF SYSTEMS:  As stated in the HPI, and otherwise negative for  other systems.   PHYSICAL EXAMINATION:  GENERAL:  The patient is in no distress.  VITAL SIGNS:  Blood pressure 122/72, heart rate 70 and regular.  HEENT:  Eyelids unremarkable, pupils equal, round, and reactive to  light, fundi not visualized, oral mucosa unremarkable.  NECK:  No jugular vein distension at 45 degrees, carotid upstroke brisk  and symmetric, no bruits, no thyromegaly.  LYMPHATICS:  No cervical, axillary, or inguinal adenopathy.  LUNGS:  Clear to auscultation bilaterally.  BACK:  No costovertebral angle tenderness.  CHEST:  Unremarkable.  HEART:  PMI not displaced or sustained, S1 and S2 within normal limits,  no S3, no S4, no clicks, rubs, or murmurs.  ABDOMEN:  Mildly obese, positive bowel sounds, normal to frequency and  pitch, no bruits, no midline pulsatile mass, no hepatomegaly, no  splenomegaly.  SKIN:  No rashes, no nodules.  EXTREMITIES:  2+ pulses upper, absent dorsalis  pedis and posterior  tibialis bilaterally, no cyanosis, clubbing.  NEUROLOGIC:  Grossly intact.   ASSESSMENT AND PLAN:  1. Chest. The patient's chest discomfort is much improved. It is      predominantly atypical and not quite similar to her previous      complaints. At this point, I plan continued aggressive risk      reduction and medical management. I will increase her Imdur to 120      mg daily.  2. Diabetes. She is having this followed by Dr. Leslie Dales and she      understands the importance of her blood sugar control.  3. Dyslipidemia, per Dr. [name] with goal LDL of less than 70 and HDL      in the 50s.  4. Hypertension. Blood pressure is well controlled. She will continue      the medications as listed.  5. Obesity. She understands the need to lose weight with diet and      exercise.  6. Follow up. I will see the patient back in four months or sooner if      needed.     Rollene Rotunda, MD, [facility]  Electronically Signed    JH/MedQ  DD: 06/25/2007  DT: 06/26/2007  Job #: [id number]   cc:   Ernestina Penna, M.D.

## 2011-02-06 NOTE — Cardiovascular Report (Signed)
[name], [name]                 ACCOUNT NO.:  [id number]   MEDICAL RECORD NO.:  [id number]          PATIENT TYPE:  OIB   LOCATION:  2908                         FACILITY:  [facility]   PHYSICIAN:  Arturo Morton. Riley Kill, MD, FACCDATE OF BIRTH:  1950-10-13   DATE OF PROCEDURE:  DATE OF DISCHARGE:                            CARDIAC CATHETERIZATION   INDICATIONS:  [name] is a 60 year old who I know from a prior evaluation.  She is followed by Dr. Antoine Poche currently.  She presented to the  emergency room at [facility] with acute chest pain and had positive  enzymes.  She was seen by Dr. Myrtis Ser and referred for urgent  catheterization.  At the time of arrival, she was coherent and agreeable  to proceed.  Dr. Henrietta Hoover concern was that of circumflex occlusion based  on the electrocardiogram and positive enzymes.  The patient was also  consented for the Plato Trial.  She is on chronic Plavix and aspirin.   PROCEDURES:  1. Left heart catheterization  2. Selective coronary arteriography.  3. Selective left ventriculography with hand injection.  4. Percutaneous stenting of the circumflex coronary artery using a      drug-eluting platform.   DESCRIPTION OF PROCEDURE:  The patient was brought to the  catheterization laboratory and prepped and draped in the usual fashion.  Through an anterior puncture, the right femoral artery was easily  entered.  A 6-French sheath was placed.  The patient's glucose was noted  to be high but she had been given Solu-Medrol prior to the procedure.  She was given methylprednisolone in the field by Dr. Myrtis Ser, because of a  history of contrast allergy.  She was also given a histamine antagonist,  and on arrival in the laboratory received 25 mg of intravenous Benadryl.  Through an anterior puncture, the femoral artery was entered.  Views of  the left and right coronary artery were then obtained in several  angiographic projections.  Central aortic and left ventricular pressures  were measured with a pigtail.  We did a hand injection only to reduce  the contrast load, given her contrast allergy history.  Preparations  were then made for percutaneous intervention.  The vessel was felt to be  about 2.3 to 2.4-mm vessel and the goal was to attempt to place a 2.5-mm  TAXUS drug-eluting stent because of the patient's underlying diabetes  mellitus.  The patient is an insulin-dependent diabetic.  We thus  proceeded with this plan.  Bivalirudin was given according to protocol.  The patient was randomized in the Plato Trial.  The patient had  previously received chewable aspirin.  A JL-3.0 guiding catheter was  utilized.  Following bivalirudin administration and ACT check, the  lesion was crossed with a Prowater wire.  Pre-dilatation was done with a  2-0 balloon followed by a 2.25-mm balloon.  Several doses of  intracoronary nitroglycerin were administered to assess vessel size.  The vessel was thought to be compatible and able to tolerate a 2.5-mm  TAXUS drug-eluting stent.  Dr. Juanda Chance and I looked at the films  together.  A 2.5-mm stent  was then deployed using 9-10 atmospheres of  pressure in an attempt to not over deploy the distal edge.  This  resulted in no evidence of edge dissection.  The balloon was then pulled  back and we passed a 2.5 Quantum Maverick which was then used with  inflations up to 13 atmospheres to post dilate the stent and fully  deploy it.  Post stent, intravascular ultrasound was not performed  because of the small size of the distal vessel.  There was an excellent  angiographic appearance with no evidence of edge tear.  All catheters  were subsequently removed and the femoral sheath was sewn into place.  We elected not to close the femoral artery largely due to the patient's  high glucose.  Serial serum glucoses were obtained.  There were no  complications.   HEMODYNAMIC DATA.:  1. Central aortic pressure 126/78, mean 99.  2. Left ventricular  pressure 115/11.  3. No gradient on pullback across the aortic valve.   ANGIOGRAPHIC DATA:  1. Ventriculography was done by hand.  Overall systolic function      appeared to be relatively normal and I would estimate ejection      fraction and to be excess of 55%.  2. The left main is fairly smooth and free of critical disease,      although slightly smaller in caliber.  3. The left anterior descending artery courses to the apex.  After the      takeoff of the diagonal branch, there is evidence of about 70%      narrowing, followed by an 80% area of segmental plaquing through      the mid distal LAD.  This vessel is probably between 2 and 2.25 in      diameter in terms of minimum lumen diameter.  Overall, the diagonal      itself has about a 70% stenosis and the septal perforator has 90%,      and actually has reduced distal flow with evidence of some      suggestion of collateralization from the distal septal perforators.  4. There is a ramus intermedius without critical narrowing.  5. The AV circumflex provides an atrial branch, and then a small AV      branch, and then is followed by a subtotal occlusion with TIMI II      flow.  Following balloon dilatation, the 99% stenosis is reduced to      0% and restoration of TIMI III flow with a slight step-up, step-      down and no evidence of residual stenosis.  6. The right coronary artery has a 20% mid stenosis and minimal      luminal irregularity distally and is a large-caliber vessel without      critical narrowing.   CONCLUSION:  1. Well-preserved overall left ventricular function.  2. Subtotal occlusion of the circumflex coronary artery with TIMI II      flow, with subsequent urgent balloon/stent intervention using a      drug-eluting platform.  3. Insulin dependent diabetes mellitus.   DISPOSITION:  The patient will be seen in followup with Dr. Antoine Poche. We have not yet decided on the plan with regard to the LAD, likely   medical therapy will be recommended.      Arturo Morton. Riley Kill, MD, [facility]  Electronically Signed     TDS/MEDQ  D:  03/21/2007  T:  03/21/2007  Job:  [id number]   cc:   [place].  [name], M.D.  Rollene Rotunda, MD, [facility]  CV Laboratory

## 2011-02-06 NOTE — Assessment & Plan Note (Signed)
[facility]                            CARDIOLOGY OFFICE NOTE   [name], [name]                        MRN:          [id number]  DATE:05/21/2007                            DOB:          [birth year]    PRIMARY CARE PHYSICIAN:  Dr. Vernon Prey.   REASON FOR PRESENTATION:  Evaluate patient with coronary disease, status  post recent PCI.   HISTORY OF PRESENT ILLNESS:  The patient is a 60 year old white female.  She has coronary disease as described below, and is status post  catheterization by Dr. Riley Kill in 2008 with a nondrug-eluting stent  placed in the LAD, and PTCA of diagonal vessel.  Since that time, the  patient has not had any of the chest pressure that she had prompting her  hospitalization.  She continues to get fleeting chest discomfort.  These  are shooting, sporadic, and unchanged from previous.  She cannot make  these come on.  She continues to get occasional palpitations, but has  had no sustained tachy palpitations, presyncope or syncope.  She has  been trying to do some housework, and continues to get dyspneic with  moderate exertion, such as working 15 minutes picking up her house.  She  is not having any resting shortness of breath.  Denies any PND or  orthopnea.  She says this is no worse than previous.   Patient had poorly controlled diabetes in the hospital.  She did see Dr.  Leslie Dales in consultation.  Unfortunately, she missed followup with him  for the birth of a granddaughter.  She plans to reschedule.   PAST MEDICAL HISTORY:  1. Diabetes mellitus x5 years.  2. Hypertension x20 years.  3. Hyperlipidemia x10 years.  4. Coronary artery disease (status post non-STEMI  March 21, 2007 with      a TAXUS DES to the circumflex.  Catheterization on April 15, 2007      demonstrated the right coronary artery 30% to 40% proximal      stenosis, left main was free of disease, the LAD had a 70% to 80%      focal stenosis, and a 2-mm  vessel.  This was stented with a non-DES      2.25-mm vessel.  Diagonal had 80% stenosis, was reduced with      angioplasty to 20% to 30% stenosis.  Circumflex had a patent drug-      eluting stent.  She had normal ejection fraction).  5. Cerebral vascular disease (40% to 60% right internal carotid artery      stenosis).  6. Depression.  7. Questionable history of multiple sclerosis.  8. Diverticulitis.  9. Hysterectomy.  10.Appendectomy.  11.Cholecystectomy.   ALLERGIES:  1. CODEINE.  2. CONTRAST DYE.   MEDICATIONS:  1. Lantus.  2. Lovaza.  3. Lipitor 80 mg daily.  4. Zetia 10 mg daily.  5. Actos 30 mg daily.  6. Januvia.  7. Atenolol 150 mg daily.  8. Aspirin 325 mg daily.  9. Humalog.  10.Nexium.  11.Ambien 10 mg daily.  12.Celexa.  13.Plavix 75 mg daily.  14.Maxzide  375/25 daily.   REVIEW OF SYSTEMS:  As stated in the HPI, and otherwise negative for  other systems.   PHYSICAL EXAMINATION:  Patient is in no distress.  Blood pressure 120/80.  Heart rate 73 and regular.  Weight 188 pounds.  Body mass index 29.  HEENT:  Eyes unremarkable.  Pupils equal, round, and reactive light.  Fundi not visualized.  Oral mucosa unremarkable.  NECK:  No jugular venous distention at 45 degrees.  Carotid upstroke  brisk and symmetric.  No bruits.  No thyromegaly.  LYMPHATICS:  No cervical, axillary, or inguinal adenopathy.  LUNGS:  Clear to auscultation bilaterally.  BACK:  No costovertebral angle tenderness.  CHEST:  Unremarkable.  HEART:  PMI not displaced or sustained.  S1 and S2 within normal limits.  No S3.  No S4.  No clicks.  No rubs.  No murmurs.  ABDOMEN:  Mildly obese.  Positive bowel sounds.  Normal in frequency and  pitch.  No bruits.  No guarding.  No midline pulsatile mass.  No  organomegaly.  SKIN:  No rashes.  No nodules.  EXTREMITIES:  Two plus upper pulses.  Absent dorsalis pedis and  posterior tibialis bilaterally.  No cyanosis or clubbing.  NEUROLOGIC:   Grossly intact.   EKG:  Sinus rhythm.  Rate 73.  Low voltage in the limb leads and  precordial leads.  Nonspecific T wave flattening diffusely.  Mild QT  prolongation.  Poor anterior R wave progression.  No significant change  from previous.   ASSESSMENT AND PLAN:  1. Chest.  The patient's discomfort has predominantly atypical      features.  The chest pressure that was the most concerning symptom,      has now gone, and has not returned.  Her dyspnea is at her      baseline.  At this point, it is not clear what symptoms could be      anginal, but I do not have a strong suspicion that would      necessitate further catheterization at this point.  I am going to      continue to try to maximize her medications, and will add Imdur 60      mg daily.  She knows to come to the emergency room immediately, or      call 911 if she has any return of her chest pressure or worsening      symptoms.  2. Diabetes.  This is a significant problem with the patient.  She was      referred to Dr. Leslie Dales, and she is encouraged to continue      followup there.  3. Dyslipidemia.  Will be per Dr. [name] with a goal LDL less than 70      and HDL in the 50s.  4. Followup.  I will see her back in 3 to 4 weeks, sooner if needed.     Rollene Rotunda, MD, [facility]  Electronically Signed    JH/MedQ  DD: 05/21/2007  DT: 05/21/2007  Job #: [id number]

## 2011-02-06 NOTE — H&P (Signed)
NAMEKURSTIN, DIMARZO                 ACCOUNT NO.:  [id number]   MEDICAL RECORD NO.:  [id number]          PATIENT TYPE:  INP   LOCATION:  2005                         FACILITY:  [facility]   PHYSICIAN:  Rollene Rotunda, MD, FACCDATE OF BIRTH:  1951-09-03   DATE OF ADMISSION:  04/14/2007  DATE OF DISCHARGE:                              HISTORY & PHYSICAL   CARDIOLOGIST:  [name].   PRIMARY CARE PHYSICIAN:  [name].   CHIEF COMPLAINT:  Chest pain.   HISTORY OF PRESENT ILLNESS:  Ms. Ulloa is a very pleasant 59 year old  female patient with a history of coronary disease, status post recent  non-ST-elevation myocardial function treated with a Taxus drug-eluting  stent to the circumflex and residual LAD disease as outlined below that  was treated medically initially, who presents to the office today for  post-hospitalization followup.  She notes continued fatigue.  She also  notes onset of exertional chest pressure with associated shortness of  breath, left arm pain and nausea.  This has been going on for quite some  time since discharge from the hospital.  It is mainly with exertion.  She does have some chest symptoms at rest.  She actually notes chest  discomfort today that she rates as a 4/10.  She denies syncope or near-  syncope.  She occasionally notes some flipflopping.  She denies any  tachy-palpitations.  She does sleep on several pillows; she has done  this for quite some time.  She denies any problems with nocturnal  dyspnea.  Denies any pedal edema.   PAST MEDICAL HISTORY:  1. Coronary artery disease.      a.     Status post non-ST-elevation myocardial function, March 21, 2007, treated with a Taxus drug-eluting stent to the circumflex.      b.     Residual LAD disease, 70%, then 80%, diagonal 70%, septal       perforator 90%, (LAD 2 to 2.25 mm) -- medical therapy.      c.     Plato study -- study drug discontinued as well as Imdur       secondary to  rash.  2. Preserved LV function, EF of 55%.  3. Diabetes mellitus.  4. Hypertension.  5. Hyperlipidemia.  6. Cerebrovascular disease.      a.     Forty percent to sixty percent right ICA stenosis; no left       ICA stenosis.  7. Depression.  8. Questionable history of multiple sclerosis.  9. History of diverticulitis.  10.History of hysterectomy.  11.History of appendectomy.  12.History of cholecystectomy.   CURRENT MEDICATIONS:  1. Diovan 320 mg daily.  2. Maxzide 37.5/25 mg daily.  3. Plavix 75 mg daily.  4. Lasix 20 mg a day.  5. Ambien 10 mg nightly p.r.n.  6. Aspirin 81 mg daily.  7. Vytorin 10/80 mg daily.  8. Nexium 40 mg daily.  9. Atenolol 75 mg b.i.d.Marland Kitchen  10.NovoLog 70/30 b.i.d.  11.Humalog sliding scale.  12.Xanax p.r.n.  13.Nitroglycerin p.r.n.   ALLERGIES:  CODEINE and IV DYE.   SOCIAL HISTORY:  She denies any tobacco or alcohol abuse.  She lives in  [place].   FAMILY HISTORY:  Significant for coronary artery disease.  Of note, her  father as well as sibling both died of sudden cardiac death in the  setting of myocardial infarction.   REVIEW OF SYSTEMS:  Please see HPI.  Denies any fevers, chills, cough,  melena, hematochezia, hematuria or dysuria.  She is somewhat  constipated.  The rest of the review of systems is negative.   PHYSICAL EXAM:  GENERAL:  She is a well-nourished, well-developed female  in no acute distress.  VITAL SIGNS:  Blood pressure is 132/80, pulse 78, weight 167 pounds.  HEENT:  Normal.  NECK:  Without JVD.  Carotids without appreciable bruits bilaterally.  LYMPH:  Without lymphadenopathy.  ENDOCRINE:  Without thyromegaly.  CARDIAC:  Normal S1 and S2.  Regular rate and rhythm without murmurs.  LUNGS:  Clear to auscultation bilaterally without wheezes, rhonchi or  rales.  ABDOMEN:  Soft and nontender with normoactive bowel sounds and no  organomegaly.  EXTREMITIES:  Without edema.  Calves soft and nontender.  SKIN:  Warm and  dry.  NEUROLOGIC:  She is alert and oriented x3.  Cranial nerves II-XII are  grossly intact.   LABORATORY AND ACCESSORY CLINICAL DATA:  Electrocardiogram reveals a  sinus rhythm with a heart rate of 88, left axis deviation, Q waves in  II, III and aVF, as well as V3 through 6, T-wave inversions in I and  aVL.   IMPRESSION:  1. Unstable angina pectoris.  2. Coronary disease.      a.     Status post non-ST-elevation myocardial infarction treated       with a Taxus drug-eluting stent to the circumflex, March 21, 2007.       b.  Residual left anterior descending disease as noted above.  3. Preserved left ventricular function, ejection fraction of 55%.  4. Diabetes mellitus.  5. Hypertension.  6. Hyperlipidemia.  7. Cerebrovascular disease with 40% to 60% right internal carotid      artery stenosis.  8. Depression.  9. History of rash secondary to Plato study drug versus Imdur.  10.Intravenous dye allergy.   PLAN:  The patient presents to the office today for followup post  hospitalization.  However, she is complaining of symptoms consistent  with unstable angina pectoris.  She is actually having rest symptoms in  the office today.  Her EKG does show some T-wave inversion in I and aVL;  otherwise, it is nonacute.  She was interviewed and examined by Dr.  Antoine Poche in the office.  At this point in time, we plan to admit her to  [facility] for further evaluation and treatment.  Dr. Antoine Poche  has been in touch with Dr. Juanda Chance, who has reviewed her films.  He feels  that an attempt should be made at percutaneous coronary intervention of  the LAD.  She will be continued on aspirin and Plavix.  Her atenolol be  increased to100 mg b.i.d..  Norvasc 5 mg daily will be added to her  medical regimen.  She will be pretreated for IV dye allergy.  The risks  and benefits have been explained to the patient and she wishes to  proceed.      Tereso Newcomer, PA-C      Rollene Rotunda, MD,  [facility]  Electronically Signed    SW/MEDQ  D:  04/14/2007  T:  04/15/2007  Job:  [id number]   cc:   Ernestina Penna, M.D.  Veverly Fells. Altheimer, M.D.

## 2011-02-06 NOTE — Assessment & Plan Note (Signed)
[facility]                            CARDIOLOGY OFFICE NOTE   [name], [name]                          MRN:          [id number]  DATE:02/05/2007                            DOB:          02/14/1951    PRIMARY CARE PHYSICIAN:  Ernestina Penna, M.D.   REASON FOR PRESENTATION:  Evaluate patient with chest discomfort and  shortness of breath.   HISTORY OF PRESENT ILLNESS:  The patient is a 60 year old white female  whom we have seen in the distant past. She apparently had a stress  perfusion study though I do not have the description of this at this  point. She has not had any diagnosed coronary disease. However, she has  significant cardiovascular risk factors. She has had progressive chest  discomfort. This has been going on for years. However, it has been  increasing in frequency and intensity. It happens typically with  exertion. It is a substernal discomfort. She says when she tries to do  something such as vacuum she gets this discomfort. She gets short of  breath. She will get short of breath climbing up the stairs too. She  chronically sleeps on three pillows, but does not describe any PND. She  is not having any resting chest discomfort. She has severe fatigue and  leg weakness. She is actually being evaluated for possible multiple  sclerosis. She has palpitations. She describes a rapid heart rate. She  was noted to have this in the office at Dr.  Kathi Der place. It was a  sinus tachycardia. She can bring this on with exertion. She denies any  associated nausea, vomiting or diaphoresis. She has had no pre-syncope  or syncope.   PAST MEDICAL HISTORY:  1. Diabetes mellitus x5 years (hemoglobin A1c last was 10.2%).  2. Hypertension x20 years.  3. Hyperlipidemia x10 years.  4. Depression x20 years.  5. Questionable multiple sclerosis.  6. Bilateral carotid artery stenosis (which is less than 39%      bilaterally in February 2006).   PAST  SURGICAL HISTORY:  1. Diverticulitis.  2. Hysterectomy.  3. Appendectomy.  4. Cholecystectomy.  5. Childbirth.   ALLERGIES:  CODEINE AND IV CONTRAST DYE.   MEDICATIONS:  1. Novolin.  2. Xanax.  3. Diovan 320 mg daily.  4. Maxzide.  5. Plavix 75 mg daily.  6. NovoLog.  7. Celexa 20 mg daily.  8. Ambien 10 mg q nightly.  9. Aspirin 81 mg daily.  10.Atenolol 50 mg b.i.d.  11.Vytorin 10/80.  12.Lorazepam.   SOCIAL HISTORY:  She has never smoked cigarettes. She does not drink  alcohol. Her husband recently had bypass surgery. She is married and has  three children. She just retired from [employer].   FAMILY HISTORY:  Is contributory for her brother having multiple  myocardial infarctions in his early 76s. Her father died of myocardial  infarction at age 53.   REVIEW OF SYSTEMS:  Positive for headaches, reflux, history of ulcer,  lower extremity swelling. Negative for other systems.   PHYSICAL EXAMINATION:  The patient is in no acute  distress. Blood  pressure 132/84, heart rate is 80 and regular.  HEENT: Eyelids unremarkable. Pupils equal, round, and reactive to light.  Fundi within normal limits. Oral mucosa is unremarkable.  NECK: No jugular venous distention at 45 degrees. Carotid upstroke brisk  and symmetrical. No bruits, no thyromegaly.  LYMPHATICS: No cervical, axillary or inguinal adenopathy.  LUNGS: Clear to auscultation bilaterally.  BACK: No costovertebral angle tenderness.  CHEST: Unremarkable.  HEART: PMI not displaced or sustained. S1, S2 within normal limits. No  S3. No S4. No clicks, rubs or murmurs.  ABDOMEN: Obese, positive bowel sounds, normal in frequency and pitch. No  bruits. No rebounds. No guarding. No midline pulsatile mass. No  hepatomegaly, splenomegaly.  SKIN: No rashes, no nodules.  EXTREMITIES: 2+ upper pulses, 1+ femorals, 1+ dorsalis pedis and  posterior tibialis bilaterally. Mild bilateral lower extremity edema.  No cyanosis or  clubbing.  NEURO: Oriented to person, place and time. Cranial nerves II-XII grossly  intact. Motor grossly intact throughout.   EKG: Sinus rhythm, rate 82, axis leftward. Intervals within normal  limits. Nonspecific anterolateral and inferior T-wave flattening with  slight inversion. No significant change from previous.   ASSESSMENT/PLAN:  1. Chest discomfort. The patient's chest discomfort is worrisome. She      has a contrast allergy and some very mild renal insufficiency (her      creatinine clearance was about 43), therefore, I would not want to      do a catheterization unless she has a high risk nuclear study. I do      think the pre-test probability of obstructive coronary disease is      at least moderately high. She will have an adenosine Cardiolite.      She would not be able to walk on a treadmill.  2. Hypertension. The patient has difficult to control hypertension. Is      also hyperglycemic. She is weak. I will get an a.m. cortisol level      when she comes for her stress test.  3. Dyspnea. Further evaluation of this will be based on the results of      her stress test. However, I will have a low level for a BNP level      and an echocardiogram.  4. Hypertension. Blood pressure is currently well-controlled and she      will continue the medications as listed.  5. Followup will be based on the results of the stress perfusion      study.     Rollene Rotunda, MD, [facility]     JH/MedQ  DD: 02/05/2007  DT: 02/05/2007  Job #: [id number]   cc:   Ernestina Penna, M.D.

## 2011-02-06 NOTE — Assessment & Plan Note (Signed)
[name]                            CARDIOLOGY OFFICE NOTE   JAZZ, BIDDY                        MRN:          [id number]  DATE:11/26/2007                            DOB:          [birth year]    REFERRING PHYSICIAN:  Ernestina Penna, M.D.   REASON FOR PRESENTATION:  Evaluate patient with coronary artery disease.   HISTORY OF PRESENT ILLNESS:  The patient presents for followup of the  above.  Since I last saw her, she has had continued problems with  hypokalemia.  This seems to have started in the summer of last year.  She is requiring increasing supplementation of her potassium.  The  etiology of this not being clear.  She has had some increased weight  gain and puffiness.  She was started on diuretics sometime last year  as well and now is on Lasix and Zaroxolyn.  She has had significant  improvement in her weight and edema.  However, she has had progressive  problems with hypokalemia.  She was recently at [name]  with hypokalemia and a urinary tract infection.  She is also had  increasing problems with her sugar being uncontrolled.  She feels weak  and wobbly.  She has had trouble with her gait.  She has had no pre-  syncope or syncope.  She has had no palpitations.  She denies any  new  shortness of breath, she has had no PND or orthopnea.  She continues to  get chest discomfort.  This happens sporadically.  She thinks has been  having increasing frequency and perhaps intensity.  It is similar to  previous pain that she has had.  She cannot bring it on.  The most  exerting thing she does is walk her dog and this does not necessarily  bring on the symptoms.  She takes  about 3-4 nitroglycerin a month which  has been fairly stable.  She has had no severe episodes associated with  nausea, vomiting or diaphoresis.   PAST MEDICAL HISTORY:  1. Diabetes mellitus times 5 years.  2. Hypertension x 20 years.  3. Hyperlipidemia  x10 years.  4. Coronary artery disease (status post non STEMI with Taxus drug      eluting stent to the circumflex coronary artery June 2008.  She had      a non drug eluting stent placed to an left anterior descending      artery  in July as well as angioplasty to the  diagonal).  5. Well-preserved ejection fraction, 40-60% right internal carotid      plaque (last evaluated in the [name] August 2008).  6. Depression.  7. Question of multiple sclerosis.  8. Diverticulitis.  9. Hysterectomy.  10.Appendectomy.  11.Cholecystectomy.   ALLERGIES:  CODEINE AND CONTRAST DYE.   MEDICATIONS:  1. Lipitor 80 mg daily.  2. Zetia 10 mg daily.  3. Actos 30 mg daily.  4. Januvia 100 mg daily.  5. Fish oil.  6. Metolazone.  7. Furosemide 60 mg daily.  8. Klor-Con 20 mEq  t.i.d.  9. Cefuroxime  25 mg daily.  10.Lantus.  11.Atenolol 75 mg b.i.d.  12.Imdur 120 mg daily.   REVIEW OF SYSTEMS:  As stated in the HPI and otherwise negative for  other systems.   PHYSICAL EXAMINATION:  The patient is in no distress.  Blood pressure  122/68, heart rate 75 and regular, weight 202 pounds, body mass index  33.  HEENT: Eyelids unremarkable. Pupils equal, round, and reactive to light.  Fundi not visualized. Oral mucosa is unremarkable.  NECK: No jugular venous distention, waveform within normal limits.  Carotid upstroke brisk and symmetrical. No bruits, no thyromegaly.  LYMPHATICS: No cervical, axillary or inguinal adenopathy.  LUNGS: Clear to auscultation bilaterally.  BACK: No costovertebral angle tenderness.  CHEST: Unremarkable.  HEART: PMI not displaced or sustained. S1, S2 within normal limits. No  S3. No S4. No clicks, rub or murmurs.  ABDOMEN: Obese, positive bowel sounds, normal in frequency and pitch. No  bruits. No rebound. No guarding. No midline pulsatile mass. No  hepatomegaly, splenomegaly.  SKIN: No rashes, no nodules.  EXTREMITIES: 2+ upper pulses, absent dorsalis pedis and  posterior  tibialis bilaterally. No cyanosis or clubbing.  NEURO: Oriented to person, place and time. Cranial nerves II-XII grossly  intact. Motor grossly intact.   EKG: Sinus rhythm, rate 75, left axis deviation, questionable old  inferior infarct, poor anterior R wave progression, QT slightly  prolonged, T-wave flattening.   ASSESSMENT/PLAN:  1. Hypokalemia.  Suspect this is somewhat related to diuretic of      course.  There may be underlying cause however.  She needs an      aldosterone level.  She might best be served by spironolactone      rather than Lasix and Zaroxolyn.  I will make that change after      seeing BMET today as well as aldosterone level.  I suspect she is      going need to further potassium supplementation.  She may also need      a cortisol level.  2. Diabetes which been difficult to control.  She is followed by Dr.      Leslie Dales and Dr. [name].  3. Chest discomfort.  Patient has continued to have chest discomfort.      I think stress perfusion study would be helpful in this situation.      She had one last year.  She has some small vessel disease.  I      probably would not believe the results if it was normal.  She does      not want to cardiac catheterization at this point.  She is going to      let me know if the symptoms get worse.  She is going to call 9-1-1      if she has any severe episodes.  She is going to use nitroglycerin      more liberally.  She will remain on the other medicines as listed.  4. Carotid plaque.  The patient will get followup in the year.  5. Decreased gait, not sure what her neurologic followup has been      since she has had this questionable diagnosis of multiple      sclerosis.  I would defer to her primary care doctors.  6. Followup. I would like to see back in about a month, but will      address her labs when they are made available to me.     Rollene Rotunda, MD, [name]  Electronically  Signed    JH/MedQ  DD:  11/26/2007  DT: 11/26/2007  Job #: [id number]   cc:   Ernestina Penna, M.D.

## 2011-02-09 NOTE — Cardiovascular Report (Signed)
[place]  Patient:    [name]                         MRN: [id number] Proc. Date: 10/26/99 Adm. Date:  [id number] Disc. Date: [id number] Attending:  Rollene Rotunda CC:         Monica Becton, M.D.                        Cardiac Catheterization  INDICATIONS:  Evaluate patient with non-Q wave myocardial infarction and chest ain in the face of rapid atrial fibrillation with mild ST segment depression.  DESCRIPTION OF PROCEDURE:  Left heart catheterization was performed via the right femoral artery.  The artery was cannulated using an anterior wall puncture.  A 6-French arterial sheath was inserted via the modified Seldinger technique.  A preformed Judkins and a pigtail catheter were utilized.  The patient tolerated he procedure well and left the laboratory in stable condition.  RESULTS:  HEMODYNAMICS:  LV 182/14, AO 174/86.  CORONARY ARTERIES: 1. The left main was normal.  2. The left anterior descending artery had luminal irregularities.  3. The circumflex had an ostial 25% stenosis.  4. The right coronary artery was dominant and normal.  LEFT VENTRICULOGRAM:  The left ventriculogram was obtained in the RAO projection. The EF was 65% with normal wall motion.  CONCLUSIONS: 1. No obstructive coronary artery disease. 2. Normal left ventricular function.  PLAN:  No further cardiovascular testing is suggested.  The patient will continue to have primary risk factor modification, including management of her hypertension. DD:  10/26/99 TD:  10/26/99 Job: [id number]

## 2011-02-09 NOTE — Procedures (Signed)
CLINICAL HISTORY:  The patient is a 60 year old woman with gait disturbance,  hypertension, diabetes, and possible multiple sclerosis. Study is being done  with the patient wearing glasses and check size of 12 x 16.   PROCEDURE:  The study was carried out using an oscillating 12 x 16 check  pattern at 1.9 cycles per second. A 250 milliseconds period was displayed  following the stimulus with all latencies interpeak latencies expressed in  milliseconds. Filters range between 1 and 100 Hz (low to high); 100 stimuli  were recorded in duplicate to provide the final response.   DESCRIPTION OF FINDINGS:  Stimulation of the left eye produced defined wave  forms with fair interrun correlation. Latencies were as follows: N1: 68.39,  P1: 93.79, N2: 113.33. Similarly, stimulation of the right eye produced well-  defined wave forms with very good interrun correlation. Latencies were as  follows:  N1: 69.37, P1: 98.67, N2: 124.08.   IMPRESSION:  These pattern reversal visual evoked responses are within  normal limits and show no evidence of conduction abnormality in the anterior  visual pathways of either eye.      ZOX:WRUE  D:  12/20/2004 07:29:47  T:  12/20/2004 08:12:57  Job #:  [id number]

## 2011-06-20 LAB — COMPREHENSIVE METABOLIC PANEL
ALT: 26
ALT: 27
AST: 25
Albumin: 3.2 — ABNORMAL LOW
Alkaline Phosphatase: 45
BUN: 5 — ABNORMAL LOW
CO2: 31
CO2: 32
Calcium: 8.8
Calcium: 9
Chloride: 100
Creatinine, Ser: 0.87
GFR calc Af Amer: >60
GFR calc non Af Amer: 54 — ABNORMAL LOW
GFR calc non Af Amer: 57 — ABNORMAL LOW
Glucose, Bld: 102 — ABNORMAL HIGH
Potassium: 3.7
Sodium: 140
Sodium: 142
Total Bilirubin: 0.8
Total Protein: 6.2

## 2011-06-20 LAB — CK TOTAL AND CKMB (NOT AT ARMC)
CK, MB: 0.9
Total CK: 233 — ABNORMAL HIGH

## 2011-06-20 LAB — CBC
HCT: 32.4 — ABNORMAL LOW
Hemoglobin: 11.6 — ABNORMAL LOW
MCHC: 35.2
MCV: 92.4
Platelets: 109 — ABNORMAL LOW
RBC: 3.64 — ABNORMAL LOW
RDW: 14.4

## 2011-06-20 LAB — POCT CARDIAC MARKERS
CKMB, poc: <1 — ABNORMAL LOW
Myoglobin, poc: 62.4

## 2011-06-20 LAB — LIPID PANEL
Cholesterol: 116
Total CHOL/HDL Ratio: 3.1

## 2011-06-20 LAB — CARDIAC PANEL(CRET KIN+CKTOT+MB+TROPI)
CK, MB: 0.7
Relative Index: 0.3
Total CK: 238 — ABNORMAL HIGH

## 2011-06-20 LAB — PROTIME-INR: Prothrombin Time: 13.9

## 2011-07-09 LAB — BASIC METABOLIC PANEL
BUN: 16
BUN: 25 — ABNORMAL HIGH
BUN: 27 — ABNORMAL HIGH
CO2: 22
CO2: 24
Calcium: 9.5
Chloride: 108
Chloride: 109
Chloride: 99
Creatinine, Ser: 1.27 — ABNORMAL HIGH
Creatinine, Ser: 1.38 — ABNORMAL HIGH
GFR calc Af Amer: 48 — ABNORMAL LOW
GFR calc Af Amer: 52 — ABNORMAL LOW
GFR calc Af Amer: >60
GFR calc non Af Amer: 39 — ABNORMAL LOW
GFR calc non Af Amer: >60
Glucose, Bld: 407 — ABNORMAL HIGH
Potassium: 3.5
Potassium: 3.6
Potassium: 3.8
Sodium: 132 — ABNORMAL LOW
Sodium: 142

## 2011-07-09 LAB — COMPREHENSIVE METABOLIC PANEL
ALT: 26
AST: 26
Albumin: 3.8
Alkaline Phosphatase: 55
BUN: 21
CO2: 28
Calcium: 10.1
Chloride: 95 — ABNORMAL LOW
Creatinine, Ser: 1.22 — ABNORMAL HIGH
GFR calc Af Amer: 55 — ABNORMAL LOW
GFR calc non Af Amer: 45 — ABNORMAL LOW
Glucose, Bld: 364 — ABNORMAL HIGH
Potassium: 4.2
Sodium: 131 — ABNORMAL LOW
Total Bilirubin: 0.7
Total Protein: 7.5

## 2011-07-09 LAB — CBC
HCT: 30.9 — ABNORMAL LOW
HCT: 31.4 — ABNORMAL LOW
HCT: 36.2
HCT: 37.8
Hemoglobin: 10.9 — ABNORMAL LOW
Hemoglobin: 12.6
Hemoglobin: 12.9
MCHC: 34.1
MCHC: 34.3
MCHC: 34.9
MCV: 87.4
MCV: 87.6
MCV: 88
MCV: 88.3
Platelets: 104 — ABNORMAL LOW
Platelets: 124 — ABNORMAL LOW
RBC: 3.5 — ABNORMAL LOW
RBC: 3.59 — ABNORMAL LOW
RBC: 4.11
RBC: 4.32
RDW: 13.3
RDW: 13.6
WBC: 7.4
WBC: 7.8
WBC: 8.9

## 2011-07-09 LAB — CARDIAC PANEL(CRET KIN+CKTOT+MB+TROPI)
CK, MB: 1.4
CK, MB: 1.4
CK, MB: 1.6
Relative Index: 0.3
Relative Index: 0.8
Total CK: 196 — ABNORMAL HIGH
Total CK: 484 — ABNORMAL HIGH
Total CK: 561 — ABNORMAL HIGH
Troponin I: 0.01
Troponin I: 0.01
Troponin I: 0.02

## 2011-07-09 LAB — B-NATRIURETIC PEPTIDE (CONVERTED LAB): Pro B Natriuretic peptide (BNP): 72

## 2011-07-09 LAB — HEPARIN LEVEL (UNFRACTIONATED): Heparin Unfractionated: 1.49 — ABNORMAL HIGH

## 2011-07-09 LAB — TYPE AND SCREEN
ABO/RH(D): A NEG
Antibody Screen: NEGATIVE

## 2011-07-09 LAB — URINALYSIS, DIPSTICK ONLY
Glucose, UA: 100 — AB
Hgb urine dipstick: NEGATIVE
Protein, ur: NEGATIVE

## 2011-07-09 LAB — ABO/RH: ABO/RH(D): A NEG

## 2011-07-09 LAB — HEMOGLOBIN A1C: Hgb A1c MFr Bld: 10 — ABNORMAL HIGH

## 2011-07-09 LAB — VITAMIN D 25 HYDROXY (VIT D DEFICIENCY, FRACTURES): Vit D, 25-Hydroxy: 35 (ref 20–57)

## 2011-07-09 LAB — APTT: aPTT: 27

## 2011-07-09 LAB — LIPID PANEL
Cholesterol: 109
LDL Cholesterol: 50
VLDL: 24

## 2011-07-09 LAB — ALBUMIN: Albumin: 2.8 — ABNORMAL LOW

## 2011-07-09 LAB — GLUCOSE, RANDOM: Glucose, Bld: 383 — ABNORMAL HIGH

## 2011-07-11 LAB — URINALYSIS, ROUTINE W REFLEX MICROSCOPIC
Nitrite: NEGATIVE
Specific Gravity, Urine: 1.012
Urobilinogen, UA: 1
pH: 7.5

## 2011-07-11 LAB — BASIC METABOLIC PANEL
BUN: 14
BUN: 17
Calcium: 8.8
Creatinine, Ser: 0.77
Creatinine, Ser: 0.85
GFR calc non Af Amer: >60
GFR calc non Af Amer: >60
Potassium: 5.2 — ABNORMAL HIGH

## 2011-07-11 LAB — CBC
HCT: 35.7 — ABNORMAL LOW
MCHC: 34.6
MCV: 89.1
Platelets: 145 — ABNORMAL LOW
Platelets: 165
RDW: 13.9
WBC: 11.6 — ABNORMAL HIGH

## 2011-07-11 LAB — URINE MICROSCOPIC-ADD ON

## 2011-07-11 LAB — CK TOTAL AND CKMB (NOT AT ARMC): Relative Index: 4 — ABNORMAL HIGH

## 2011-07-11 LAB — CARDIAC PANEL(CRET KIN+CKTOT+MB+TROPI)
Relative Index: 3.8 — ABNORMAL HIGH
Troponin I: 15.53

## 2011-07-11 LAB — TROPONIN I: Troponin I: 23.07

## 2011-08-22 ENCOUNTER — Telehealth: Payer: Self-pay | Admitting: Cardiology

## 2011-08-22 NOTE — Telephone Encounter (Signed)
New Msg: Pt calling wanting to discuss with Md about scheduling a CATH. Please return pt call to discuss further.

## 2011-08-22 NOTE — Telephone Encounter (Signed)
No answer at home. Debbie Kesley Mullens RN  

## 2011-08-23 ENCOUNTER — Inpatient Hospital Stay (HOSPITAL_COMMUNITY)
Admission: EM | Admit: 2011-08-23 | Discharge: 2011-08-25 | DRG: 638 | Disposition: A | Discharge status: Home / Self Care | Payer: Medicare Other | Attending: Internal Medicine | Admitting: Internal Medicine

## 2011-08-23 ENCOUNTER — Encounter (HOSPITAL_COMMUNITY): Payer: Self-pay | Admitting: *Deleted

## 2011-08-23 ENCOUNTER — Emergency Department (HOSPITAL_COMMUNITY): Payer: Medicare Other

## 2011-08-23 DIAGNOSIS — I10 Essential (primary) hypertension: Secondary | ICD-10-CM | POA: Diagnosis present

## 2011-08-23 DIAGNOSIS — R079 Chest pain, unspecified: Secondary | ICD-10-CM

## 2011-08-23 DIAGNOSIS — Z794 Long term (current) use of insulin: Secondary | ICD-10-CM

## 2011-08-23 DIAGNOSIS — I739 Peripheral vascular disease, unspecified: Secondary | ICD-10-CM | POA: Diagnosis present

## 2011-08-23 DIAGNOSIS — F3289 Other specified depressive episodes: Secondary | ICD-10-CM | POA: Diagnosis present

## 2011-08-23 DIAGNOSIS — E1165 Type 2 diabetes mellitus with hyperglycemia: Secondary | ICD-10-CM | POA: Diagnosis present

## 2011-08-23 DIAGNOSIS — IMO0001 Reserved for inherently not codable concepts without codable children: Principal | ICD-10-CM | POA: Diagnosis present

## 2011-08-23 DIAGNOSIS — E1159 Type 2 diabetes mellitus with other circulatory complications: Secondary | ICD-10-CM | POA: Diagnosis present

## 2011-08-23 DIAGNOSIS — I509 Heart failure, unspecified: Secondary | ICD-10-CM | POA: Diagnosis present

## 2011-08-23 DIAGNOSIS — I5032 Chronic diastolic (congestive) heart failure: Secondary | ICD-10-CM | POA: Diagnosis present

## 2011-08-23 DIAGNOSIS — E876 Hypokalemia: Secondary | ICD-10-CM | POA: Diagnosis present

## 2011-08-23 DIAGNOSIS — E785 Hyperlipidemia, unspecified: Secondary | ICD-10-CM | POA: Diagnosis present

## 2011-08-23 DIAGNOSIS — Z7902 Long term (current) use of antithrombotics/antiplatelets: Secondary | ICD-10-CM

## 2011-08-23 DIAGNOSIS — F329 Major depressive disorder, single episode, unspecified: Secondary | ICD-10-CM | POA: Diagnosis present

## 2011-08-23 DIAGNOSIS — I2510 Atherosclerotic heart disease of native coronary artery without angina pectoris: Secondary | ICD-10-CM | POA: Diagnosis present

## 2011-08-23 LAB — GLUCOSE, CAPILLARY
Glucose-Capillary: 358 mg/dL — ABNORMAL HIGH (ref 70–99)
Glucose-Capillary: 373 mg/dL — ABNORMAL HIGH (ref 70–99)
Glucose-Capillary: 373 mg/dL — ABNORMAL HIGH (ref 70–99)

## 2011-08-23 LAB — POCT I-STAT, CHEM 8
Creatinine, Ser: 0.7 mg/dL (ref 0.50–1.10)
HCT: 44 % (ref 36.0–46.0)
Hemoglobin: 15 g/dL (ref 12.0–15.0)
Sodium: 138 mEq/L (ref 135–145)
TCO2: 29 mmol/L (ref 0–100)

## 2011-08-23 LAB — CBC
HCT: 40.5 % (ref 36.0–46.0)
MCH: 29.8 pg (ref 26.0–34.0)
MCHC: 35.6 g/dL (ref 30.0–36.0)
MCHC: 35.2 g/dL (ref 30.0–36.0)
MCV: 84.2 fL (ref 78.0–100.0)
RBC: 4.49 MIL/uL (ref 3.87–5.11)
RDW: 12.9 % (ref 11.5–15.5)
WBC: 6.2 10*3/uL (ref 4.0–10.5)

## 2011-08-23 LAB — CARDIAC PANEL(CRET KIN+CKTOT+MB+TROPI)
CK, MB: 2.1 ng/mL (ref 0.3–4.0)
Total CK: 166 U/L (ref 7–177)

## 2011-08-23 LAB — PRO B NATRIURETIC PEPTIDE: Pro B Natriuretic peptide (BNP): 24.6 pg/mL (ref 0–125)

## 2011-08-23 LAB — CREATININE, SERUM
GFR calc Af Amer: >90 mL/min (ref >90)
GFR calc non Af Amer: >90 mL/min (ref >90)

## 2011-08-23 LAB — D-DIMER, QUANTITATIVE: D-Dimer, Quant: 0.83 ug/mL-FEU — ABNORMAL HIGH (ref 0.00–0.48)

## 2011-08-23 MED ORDER — ASPIRIN 325 MG PO TABS
325.0000 mg | ORAL_TABLET | Freq: Every day | ORAL | Status: DC
  Administered 2011-08-24 – 2011-08-25 (×2): 325 mg via ORAL
  Filled 2011-08-23 (×3): qty 1

## 2011-08-23 MED ORDER — SODIUM CHLORIDE 0.9 % IJ SOLN: 3.0000 mL | Freq: Two times a day (BID) | INTRAMUSCULAR | Status: DC

## 2011-08-23 MED ORDER — POTASSIUM CHLORIDE CRYS ER 20 MEQ PO TBCR
20.0000 meq | EXTENDED_RELEASE_TABLET | Freq: Every day | ORAL | Status: DC
  Administered 2011-08-23 – 2011-08-25 (×3): 20 meq via ORAL
  Filled 2011-08-23 (×4): qty 1

## 2011-08-23 MED ORDER — SODIUM CHLORIDE 0.9 % IV SOLN
INTRAVENOUS | Status: DC
  Administered 2011-08-23: 23:00:00 via INTRAVENOUS

## 2011-08-23 MED ORDER — INSULIN ASPART 100 UNIT/ML ~~LOC~~ SOLN
SUBCUTANEOUS | Status: AC
  Administered 2011-08-23: 10 [IU] via SUBCUTANEOUS
  Filled 2011-08-23: qty 1

## 2011-08-23 MED ORDER — FUROSEMIDE 40 MG PO TABS
40.0000 mg | ORAL_TABLET | Freq: Two times a day (BID) | ORAL | Status: DC
  Administered 2011-08-24 – 2011-08-25 (×3): 40 mg via ORAL
  Filled 2011-08-23 (×6): qty 1

## 2011-08-23 MED ORDER — ALUM & MAG HYDROXIDE-SIMETH 200-200-20 MG/5ML PO SUSP: 30.0000 mL | Freq: Four times a day (QID) | ORAL | Status: DC | PRN

## 2011-08-23 MED ORDER — ASPIRIN 81 MG PO CHEW
324.0000 mg | CHEWABLE_TABLET | Freq: Once | ORAL | Status: AC
  Administered 2011-08-23: 324 mg via ORAL
  Filled 2011-08-23: qty 4

## 2011-08-23 MED ORDER — ONDANSETRON HCL 4 MG/2ML IJ SOLN: 4.0000 mg | Freq: Once | INTRAMUSCULAR | Status: DC

## 2011-08-23 MED ORDER — PANTOPRAZOLE SODIUM 40 MG PO TBEC
40.0000 mg | DELAYED_RELEASE_TABLET | Freq: Every day | ORAL | Status: DC
  Administered 2011-08-23 – 2011-08-25 (×3): 40 mg via ORAL
  Filled 2011-08-23 (×2): qty 1

## 2011-08-23 MED ORDER — ACETAMINOPHEN 650 MG RE SUPP: 650.0000 mg | Freq: Four times a day (QID) | RECTAL | Status: DC | PRN

## 2011-08-23 MED ORDER — ONDANSETRON HCL 4 MG/2ML IJ SOLN: 4.0000 mg | Freq: Four times a day (QID) | INTRAMUSCULAR | Status: DC | PRN

## 2011-08-23 MED ORDER — INSULIN REGULAR HUMAN 100 UNIT/ML IJ SOLN
10.0000 [IU] | Freq: Once | INTRAMUSCULAR | Status: AC
  Administered 2011-08-23: 10 [IU] via SUBCUTANEOUS
  Filled 2011-08-23: qty 0.1

## 2011-08-23 MED ORDER — ISOSORBIDE MONONITRATE ER 60 MG PO TB24
120.0000 mg | ORAL_TABLET | Freq: Every day | ORAL | Status: DC
  Administered 2011-08-23 – 2011-08-25 (×3): 120 mg via ORAL
  Filled 2011-08-23 (×3): qty 2

## 2011-08-23 MED ORDER — ROSUVASTATIN CALCIUM 40 MG PO TABS
40.0000 mg | ORAL_TABLET | Freq: Every day | ORAL | Status: DC
  Administered 2011-08-23 – 2011-08-25 (×3): 40 mg via ORAL
  Filled 2011-08-23 (×3): qty 1

## 2011-08-23 MED ORDER — NITROGLYCERIN 0.4 MG SL SUBL: 0.4000 mg | SUBLINGUAL_TABLET | SUBLINGUAL | Status: DC | PRN

## 2011-08-23 MED ORDER — ENOXAPARIN SODIUM 40 MG/0.4ML ~~LOC~~ SOLN
40.0000 mg | SUBCUTANEOUS | Status: DC
  Administered 2011-08-23: 40 mg via SUBCUTANEOUS
  Filled 2011-08-23 (×3): qty 0.4

## 2011-08-23 MED ORDER — EZETIMIBE 10 MG PO TABS
10.0000 mg | ORAL_TABLET | Freq: Every day | ORAL | Status: DC
  Administered 2011-08-23 – 2011-08-25 (×3): 10 mg via ORAL
  Filled 2011-08-23 (×3): qty 1

## 2011-08-23 MED ORDER — DOCUSATE SODIUM 100 MG PO CAPS
100.0000 mg | ORAL_CAPSULE | Freq: Two times a day (BID) | ORAL | Status: DC
  Administered 2011-08-23 – 2011-08-25 (×4): 100 mg via ORAL
  Filled 2011-08-23 (×5): qty 1

## 2011-08-23 MED ORDER — VITAMIN D (ERGOCALCIFEROL) 1.25 MG (50000 UNIT) PO CAPS
50000.0000 [IU] | ORAL_CAPSULE | ORAL | Status: DC
  Administered 2011-08-24: 50000 [IU] via ORAL
  Filled 2011-08-23 (×2): qty 1

## 2011-08-23 MED ORDER — GABAPENTIN 300 MG PO CAPS
300.0000 mg | ORAL_CAPSULE | Freq: Two times a day (BID) | ORAL | Status: DC
  Administered 2011-08-23 – 2011-08-25 (×4): 300 mg via ORAL
  Filled 2011-08-23 (×5): qty 1

## 2011-08-23 MED ORDER — METOPROLOL TARTRATE 1 MG/ML IV SOLN
5.0000 mg | Freq: Once | INTRAVENOUS | Status: AC
  Administered 2011-08-23: 5 mg via INTRAVENOUS
  Filled 2011-08-23: qty 5

## 2011-08-23 MED ORDER — ALPRAZOLAM 0.5 MG PO TABS
0.5000 mg | ORAL_TABLET | Freq: Three times a day (TID) | ORAL | Status: DC | PRN
  Administered 2011-08-23 – 2011-08-24 (×2): 0.5 mg via ORAL
  Filled 2011-08-23 (×2): qty 1

## 2011-08-23 MED ORDER — SODIUM CHLORIDE 0.9 % IV SOLN
INTRAVENOUS | Status: DC
  Administered 2011-08-23: 3.1 [IU]/h via INTRAVENOUS
  Filled 2011-08-23: qty 1

## 2011-08-23 MED ORDER — SODIUM CHLORIDE 0.9 % IV SOLN: 250.0000 mL | INTRAVENOUS | Status: DC | PRN

## 2011-08-23 MED ORDER — ZOLPIDEM TARTRATE 5 MG PO TABS: 5.0000 mg | ORAL_TABLET | Freq: Every evening | ORAL | Status: DC | PRN

## 2011-08-23 MED ORDER — NITROGLYCERIN 0.4 MG SL SUBL
0.4000 mg | SUBLINGUAL_TABLET | SUBLINGUAL | Status: DC | PRN
  Administered 2011-08-23 (×2): 0.4 mg via SUBLINGUAL
  Filled 2011-08-23: qty 25

## 2011-08-23 MED ORDER — INSULIN DETEMIR 100 UNIT/ML ~~LOC~~ SOLN
100.0000 [IU] | Freq: Two times a day (BID) | SUBCUTANEOUS | Status: DC
  Filled 2011-08-23: qty 3

## 2011-08-23 MED ORDER — INSULIN REGULAR BOLUS VIA INFUSION
0.0000 [IU] | Freq: Three times a day (TID) | INTRAVENOUS | Status: DC
  Administered 2011-08-24: 5.6 [IU] via INTRAVENOUS
  Filled 2011-08-23 (×4): qty 10

## 2011-08-23 MED ORDER — ACETAMINOPHEN 325 MG PO TABS
650.0000 mg | ORAL_TABLET | Freq: Four times a day (QID) | ORAL | Status: DC | PRN
  Administered 2011-08-24: 650 mg via ORAL
  Filled 2011-08-23: qty 2

## 2011-08-23 MED ORDER — DEXTROSE-NACL 5-0.45 % IV SOLN
INTRAVENOUS | Status: DC
  Administered 2011-08-24: 06:00:00 via INTRAVENOUS

## 2011-08-23 MED ORDER — DEXTROSE 50 % IV SOLN: 25.0000 mL | INTRAVENOUS | Status: DC | PRN

## 2011-08-23 MED ORDER — SODIUM CHLORIDE 0.9 % IV BOLUS (SEPSIS): 1000.0000 mL | Freq: Once | INTRAVENOUS | Status: DC

## 2011-08-23 MED ORDER — LISINOPRIL 20 MG PO TABS
20.0000 mg | ORAL_TABLET | Freq: Every day | ORAL | Status: DC
  Administered 2011-08-23 – 2011-08-25 (×3): 20 mg via ORAL
  Filled 2011-08-23 (×3): qty 1

## 2011-08-23 MED ORDER — SODIUM CHLORIDE 0.9 % IJ SOLN: 3.0000 mL | INTRAMUSCULAR | Status: DC | PRN

## 2011-08-23 MED ORDER — CLOPIDOGREL BISULFATE 75 MG PO TABS
75.0000 mg | ORAL_TABLET | Freq: Every day | ORAL | Status: DC
  Administered 2011-08-23 – 2011-08-25 (×3): 75 mg via ORAL
  Filled 2011-08-23 (×3): qty 1

## 2011-08-23 MED ORDER — SODIUM CHLORIDE 0.9 % IV SOLN: INTRAVENOUS | Status: DC

## 2011-08-23 MED ORDER — ONDANSETRON HCL 4 MG PO TABS: 4.0000 mg | ORAL_TABLET | Freq: Four times a day (QID) | ORAL | Status: DC | PRN

## 2011-08-23 NOTE — ED Provider Notes (Signed)
3:41 PM   Pt care resumed from Vanetta Mulders PA-C, Plan is to consult cardiology Conley Rolls [facility]) for possible cardiac cath d/t CP. Pt currently has BG of 402, 10 U regular insulin given, but fluids pending cardiac enzymes. Re-evaluated pt, she is not currently having CP or SOB, no gap on Chm 8, breath does not smell like ketones.   5:01 PM Spoke with Adolph Pollack who will come see pt.  Pt currently not having CP, No complaints  6:09 PM Spoke with her cardiologist who requests she be admitted to hospitalist & they will stay on board as consult. There is no concern for CHF per her doctor (Dr. Antoine Poche) & 1 L of bolus will be given. Repeat EKG in morning & serial cardiac markers over night.  Ketones negative.   6:40 PM Pt being admitted for uncontrolled DM, Atypical CP, Dr. Jordan Hawks [facility] Team #2; Telemetry bed NPO  Jaci Carrel, [place] 08/23/11 1840

## 2011-08-23 NOTE — ED Provider Notes (Signed)
History     CSN: [id number] Arrival date & time: 08/23/2011 12:21 PM   First MD Initiated Contact with Patient 08/23/11 1228      Chief Complaint  Patient presents with  . Chest Pain    (Consider location/radiation/quality/duration/timing/severity/associated sxs/prior treatment) HPI Comments: Patient here with worsening anterior chest pain - she states that she is having more pressure than pain - reports that this has been associated with stress and nausea.  She reports calling Dr. Jenene Slicker office this morning to discuss getting a cardiac catheterization done early next week - they told her to come here because she remained symptomatic.  Patient is a 60 y.o. female presenting with chest pain. The history is provided by the patient. No language interpreter was used.  Chest Pain The chest pain began 5 - 7 days ago. Chest pain occurs intermittently. The chest pain is unchanged. The pain is associated with exertion and stress. At its most intense, the pain is at 5/10. The pain is currently at 5/10. The severity of the pain is moderate. The quality of the pain is described as heavy and pressure-like. The pain does not radiate. Chest pain is worsened by stress and exertion. Primary symptoms include shortness of breath and nausea. Pertinent negatives for primary symptoms include no fever, no fatigue, no cough, no palpitations, no abdominal pain, no vomiting and no dizziness.  Associated symptoms include lower extremity edema and weakness.  Pertinent negatives for associated symptoms include no near-syncope, no numbness and no paroxysmal nocturnal dyspnea. She tried nitroglycerin and aspirin for the symptoms. Risk factors include obesity, post-menopausal and stress.  Her past medical history is significant for CAD.     Past Medical History  Diagnosis Date  . Hypokalemia   . Diverticulitis, colon   . MS (multiple sclerosis)     not confirmed  . Depression   . PVD (peripheral vascular  disease)   . DM (diabetes mellitus)   . HLD (hyperlipidemia)   . HTN (hypertension)   . CAD (coronary artery disease)     Non STEMI,Taxus drug circumflex June 2008, non drug eluting stent left anterior descending in July as well as angioplasty to the  diagonal.  Diag 80% stenosis with PCI April 2011  . Chest pain   . Carotid artery plaque     Mild plaque  . IDDM (insulin dependent diabetes mellitus)   . GERD (gastroesophageal reflux disease)   . CHF (congestive heart failure)   . Prolapse of uterus   . TIA (transient ischemic attack)   . PAT (paroxysmal atrial tachycardia)   . Hemiplegic migraine   . Anxiety   . Carotid artery stenosis     bilateral     Past Surgical History  Procedure Date  . Appendectomy   . Cholecystectomy   . Cardiac cath with angioplasty and stent 03/2007  . Partial hysterectomy 1986    ovaries left - prolaspe uterus   . Breast lumpectomy     Dr. Luan Moore     Family History  Problem Relation Age of Onset  . Heart attack Mother   . Diabetes Father   . Heart attack Father   . Heart attack Brother     x6    History  Substance Use Topics  . Smoking status: Never Smoker   . Smokeless tobacco: Not on file  . Alcohol Use: No    OB History    Grav Para Term Preterm Abortions TAB SAB Ect Mult Living  Review of Systems  Constitutional: Negative for fever and fatigue.  Respiratory: Positive for shortness of breath. Negative for cough.   Cardiovascular: Positive for chest pain. Negative for palpitations and near-syncope.  Gastrointestinal: Positive for nausea. Negative for vomiting and abdominal pain.  Neurological: Positive for weakness. Negative for dizziness and numbness.  All other systems reviewed and are negative.    Allergies  Codeine; Iohexol; and Ticlid  Home Medications   Current Outpatient Rx  Name Route Sig Dispense Refill  . ALPRAZOLAM 0.5 MG PO TABS Oral Take 0.5 mg by mouth 3 (three) times daily as needed.  For anxiety    . ASPIRIN 325 MG PO TABS Oral Take 325 mg by mouth daily.      Marland Kitchen CLOPIDOGREL BISULFATE 75 MG PO TABS Oral Take 75 mg by mouth daily.     Marland Kitchen ESOMEPRAZOLE MAGNESIUM 40 MG PO CPDR Oral Take 40 mg by mouth daily before breakfast.      . EZETIMIBE 10 MG PO TABS Oral Take 10 mg by mouth daily.      . FUROSEMIDE 40 MG PO TABS Oral Take 40 mg by mouth 2 (two) times daily.      Marland Kitchen GABAPENTIN 300 MG PO CAPS Oral Take 300 mg by mouth 2 (two) times daily.      . INSULIN DETEMIR 100 UNIT/ML [place] SOLN Subcutaneous Inject 100 Units into the skin 2 (two) times daily.      . INSULIN LISPRO (HUMAN) 100 UNIT/ML [place] SOLN Subcutaneous Inject 24-26 Units into the skin as directed.     . ISOSORBIDE MONONITRATE 120 MG PO TB24 Oral Take 120 mg by mouth daily.     Marland Kitchen LISINOPRIL 20 MG PO TABS Oral Take 20 mg by mouth daily.      Marland Kitchen NITROGLYCERIN 0.4 MG SL SUBL Sublingual Place 0.4 mg under the tongue every 5 (five) minutes as needed. For chest pain    . POTASSIUM CHLORIDE CRYS CR 20 MEQ PO TBCR Oral Take 20 mEq by mouth daily.      Marland Kitchen ROSUVASTATIN CALCIUM 40 MG PO TABS Oral Take 40 mg by mouth daily.      Marland Kitchen SITAGLIPTIN-METFORMIN HCL 50-500 MG PO TABS Oral Take 1 tablet by mouth 2 (two) times daily with a meal.     . VITAMIN D (ERGOCALCIFEROL) 50000 UNITS PO CAPS Oral Take 50,000 Units by mouth every 7 (seven) days. friday     . ZOLPIDEM TARTRATE 10 MG PO TABS Oral Take 10 mg by mouth at bedtime as needed. For sleep      BP 175/83  Pulse 112  Temp(Src) 98 F (36.7 C) (Oral)  Resp 20  SpO2 96%  Physical Exam  Nursing note and vitals reviewed. Constitutional: She is oriented to person, place, and time. She appears well-developed and well-nourished.       tachycardia  HENT:  Head: Normocephalic and atraumatic.  Right Ear: External ear normal.  Left Ear: External ear normal.  Mouth/Throat: Oropharynx is clear and moist.  Eyes: Conjunctivae are normal. Pupils are equal, round, and reactive to light.  Neck:  Normal range of motion. Neck supple.  Cardiovascular: Normal rate, regular rhythm and normal heart sounds.   Pulmonary/Chest: Effort normal and breath sounds normal. No respiratory distress.  Abdominal: Soft. Bowel sounds are normal. There is no tenderness.  Musculoskeletal: Normal range of motion. She exhibits edema. She exhibits no tenderness.  Neurological: She is alert and oriented to person, place, and time.  Skin: Skin is  warm and dry.  Psychiatric: She has a normal mood and affect. Her behavior is normal. Judgment and thought content normal.    ED Course  Procedures (including critical care time)   Labs Reviewed  PRO B NATRIURETIC PEPTIDE  CBC  CARDIAC PANEL(CRET KIN+CKTOT+MB+TROPI)  I-STAT, CHEM 8  MAGNESIUM  KETONES, QUALITATIVE   Dg Chest Portable 1 View  08/23/2011  *RADIOLOGY REPORT*  Clinical Data: Chest pain  PORTABLE CHEST - 1 VIEW  Comparison: 02/09/2008  Findings: Lungs are under aerated and grossly clear.  Heart is normal in size.  No new thorax.  IMPRESSION: No active cardiopulmonary disease.  Original Report Authenticated By: Donavan Burnet, M.D.    Date: 08/23/2011  Rate: 112  Rhythm: sinus tachycardia  QRS Axis: left  Intervals: normal  ST/T Wave abnormalities: ST depressions inferiorly  Conduction Disutrbances:none  Narrative Interpretation: Reviewed by Dr. Estell Harpin  Old EKG Reviewed: changes noted  No diagnosis found.    MDM  Patient here with chest pain in the face of ACS and to be scheduled for cardiac cath.  To be followed by L. Drue Novel, PA-C        Izola Price [place], [place] 08/23/11 [phone]

## 2011-08-23 NOTE — Telephone Encounter (Signed)
Pt calling c/o tightness in chest off and on for 2 weeks.  Having nausea and vomiting also but doesn't seem to be related to chest tightness.  Reports her BP has been elevated 170/110 HR around 100 or higher.  She has bilateral edema and SOB which is not new.  She is pale and reports that people are telling her she looks "bad".  She is having some left arm pain and has been under a lot of stress.  She is having discomfort in her chest now.  She took SL Ntg yesterday without relief but feels like she "has a blockage."  Pt advised not to drive but that she needs to report to [facility] ED for evaluation ASAP.  She should also use her SL Ntg. She states understanding.

## 2011-08-23 NOTE — H&P (Signed)
PATIENT DETAILS Name: [name] Age: 60 y.o. Sex: female Date of Birth: November 23, 1950 Admit Date: 08/23/2011 PCP:No primary provider on file.   CHIEF COMPLAINT:  Chest pains High blood sugars  HPI: This is a 60 year old female with a past medical history significant for coronary artery disease as described below as well as diabetes mellitus type 2 requiring large doses of insulin. Today she presented to the emergency department as instructed by her cardiologist with chest pain. Is described as a heaviness and pressure-like sensation, are not, nonradiating, at rest as well as with exertion and stress. Associated to nausea without vomiting. No diaphoresis, no lightheadedness, no dizziness or syncope. In emergency department the patient was found with negative cardiac markers, no acute EKG changes and negative chest x-ray. Cardiology has evaluated the patient in the ED and found the patient with low probability for acute coronary syndrome. The patient is being admitted to the hospital for serial evaluations with cardiac markers as well as control her blood sugar. Currently her blood sugar is 373.  ALLERGIES:   Allergies  Allergen Reactions  . Codeine Nausea And Vomiting  . Iohexol      Desc: pt had syncopal episode with nausea post IV CM late 1990's,  pt has had prednisone prep with heart caths x 2 without problem  kdean 04/16/07, Onset Date: [id number]   . Ticlid (Ticlopidine Hcl)     unknown    PAST MEDICAL HISTORY: Past Medical History  Diagnosis Date  . Hypokalemia   . Diverticulitis, colon   . MS (multiple sclerosis)     not confirmed  . Depression   . PVD (peripheral vascular disease)   . DM (diabetes mellitus)   . HLD (hyperlipidemia)   . HTN (hypertension)   . CAD (coronary artery disease)     Non STEMI,Taxus drug circumflex June 2008, non drug eluting stent left anterior descending in July as well as angioplasty to the  diagonal.  Diag 80% stenosis with PCI April 2011  .  Chest pain   . Carotid artery plaque     Mild plaque  . IDDM (insulin dependent diabetes mellitus)   . GERD (gastroesophageal reflux disease)   . CHF (congestive heart failure)   . Prolapse of uterus   . TIA (transient ischemic attack)   . PAT (paroxysmal atrial tachycardia)   . Hemiplegic migraine   . Anxiety     PAST SURGICAL HISTORY: Past Surgical History  Procedure Date  . Appendectomy   . Cholecystectomy   . Cardiac cath with angioplasty and stent 03/2007  . Partial hysterectomy 1986    ovaries left - prolaspe uterus   . Breast lumpectomy     Dr. Luan Moore     MEDICATIONS AT HOME: Prior to Admission medications   Medication Sig Start Date End Date Taking? Authorizing Provider  ALPRAZolam Prudy Feeler) 0.5 MG tablet Take 0.5 mg by mouth 3 (three) times daily as needed. For anxiety   Yes Historical Provider, MD  aspirin 325 MG tablet Take 325 mg by mouth daily.     Yes Historical Provider, MD  clopidogrel (PLAVIX) 75 MG tablet Take 75 mg by mouth daily.    Yes Historical Provider, MD  esomeprazole (NEXIUM) 40 MG capsule Take 40 mg by mouth daily before breakfast.     Yes Historical Provider, MD  ezetimibe (ZETIA) 10 MG tablet Take 10 mg by mouth daily.     Yes Historical Provider, MD  furosemide (LASIX) 40 MG tablet Take 40 mg  by mouth 2 (two) times daily.     Yes Historical Provider, MD  gabapentin (NEURONTIN) 300 MG capsule Take 300 mg by mouth 2 (two) times daily.     Yes Historical Provider, MD  insulin detemir (LEVEMIR) 100 UNIT/ML injection Inject 100 Units into the skin 2 (two) times daily.     Yes Historical Provider, MD  insulin lispro (HUMALOG) 100 UNIT/ML injection Inject 24-26 Units into the skin as directed.    Yes Historical Provider, MD  isosorbide mononitrate (IMDUR) 120 MG 24 hr tablet Take 120 mg by mouth daily.    Yes Historical Provider, MD  lisinopril (PRINIVIL,ZESTRIL) 20 MG tablet Take 20 mg by mouth daily.     Yes Historical Provider, MD  nitroGLYCERIN  (NITROSTAT) 0.4 MG SL tablet Place 0.4 mg under the tongue every 5 (five) minutes as needed. For chest pain   Yes Historical Provider, MD  potassium chloride SA (K-DUR,KLOR-CON) 20 MEQ tablet Take 20 mEq by mouth daily.     Yes Historical Provider, MD  rosuvastatin (CRESTOR) 40 MG tablet Take 40 mg by mouth daily.     Yes Historical Provider, MD  sitaGLIPtan-metformin (JANUMET) 50-500 MG per tablet Take 1 tablet by mouth 2 (two) times daily with a meal.    Yes Historical Provider, MD  Vitamin D, Ergocalciferol, (DRISDOL) 50000 UNITS CAPS Take 50,000 Units by mouth every 7 (seven) days. friday    Yes Historical Provider, MD  zolpidem (AMBIEN) 10 MG tablet Take 10 mg by mouth at bedtime as needed. For sleep   Yes Historical Provider, MD    FAMILY HISTORY: Family History  Problem Relation Age of Onset  . Heart attack Mother 64  . Diabetes Father   . Heart attack Father 73  . Heart attack Brother 30    x6    SOCIAL HISTORY:  reports that she has never smoked. She does not have any smokeless tobacco history on file. She reports that she does not drink alcohol or use illicit drugs. On disability. Her husband has been diagnosed with non-Hodgkin's lymphoma and is undergoing treatment for it. Tomorrow is his first. She is dealing with a lot of stress these days  REVIEW OF SYSTEMS:  Constitutional:   No  weight loss, night sweats,  Fevers, chills. Positive for fatigue  HEENT:    Denies sore throat, ear ache, sneezing, sinus tenderness or headaches  Cardio-vascular: As in history of present illness  GI:  Was in for nausea. No vomiting. No hematochezia or melena. No diarrhea or constipation.  Resp: No shortness of breath with exertion or at rest.  No excess mucus, no productive cough, No non-productive cough,  No coughing up of blood.No change in color of mucus.No wheezing.No chest wall deformity  Skin:  no rash or lesions.  GU:  no dysuria, change in color of urine, no urgency or  frequency.  No flank pain.  Musculoskeletal: No joint pain or swelling.  No decreased range of motion.  No back pain.  Psych: Needs feeling stressed out and depressed but not suicidal  PHYSICAL EXAM: Blood pressure 168/48, pulse 90, temperature 98.1 F (36.7 C), temperature source Oral, resp. rate 14, SpO2 97.00%.  General appearance :Awake, alert, not in any distress. Looks anxious HEENT: Atraumatic and Normocephalic, pupils equally reactive to light and accomodation Neck: supple, no JVD. No cervical lymphadenopathy.  Chest:Good air entry bilaterally, no added sounds  CVS: S1 S2 regular, no murmurs.  Abdomen: Bowel sounds present, Non tender and not distended  with no gaurding, rigidity or rebound. Extremities: Bilateral trace edema Neurology: Awake alert, and oriented X 3, CN II-XII intact, Non focal, Deep Tendon Reflex-2+ all over, plantar's downgoing B/L, sensory exam is grossly intact.  Skin:No Rash Wounds:N/A  LABS ON ADMISSION:   Basename 08/23/11 1521 08/23/11 1457  NA 138 --  K 3.7 --  CL 98 --  CO2 -- --  GLUCOSE 409* --  BUN 14 --  CREATININE 0.70 --  CALCIUM -- --  MG -- 1.6  PHOS -- --   No results found for this basename: AST:2,ALT:2,ALKPHOS:2,BILITOT:2,PROT:2,ALBUMIN:2 in the last 72 hours No results found for this basename: LIPASE:2,AMYLASE:2 in the last 72 hours  Basename 08/23/11 1521 08/23/11 1457  WBC -- 8.3  NEUTROABS -- --  HGB 15.0 14.4  HCT 44.0 40.5  MCV -- 83.9  PLT -- 147*    Basename 08/23/11 1457  CKTOTAL 166  CKMB 2.1  CKMBINDEX --  TROPONINI <0.30   No results found for this basename: DDIMER:2 in the last 72 hours  Basename 08/23/11 1452  POCBNP 24.6     RADIOLOGIC STUDIES ON ADMISSION: Dg Chest Portable 1 View  08/23/2011  *RADIOLOGY REPORT*  Clinical Data: Chest pain  PORTABLE CHEST - 1 VIEW  Comparison: 02/09/2008  Findings: Lungs are under aerated and grossly clear.  Heart is normal in size.  No new thorax.   IMPRESSION: No active cardiopulmonary disease.  Original Report Authenticated By: Donavan Burnet, M.D.   EKG: Sinus rhythm, rate 112, axis within normal limits, intervals within normal limits, no acute ST-T wave changes, old inferior MI, poor anterior R wave progression, no acute ST T wave changes.    ASSESSMENT AND PLAN: Present on Admission:  .Diabetes type 2, uncontrolled .Chest pain .HTN (hypertension)  Admit patient to telemetry unit for continuous cardiac monitoring Obtain serial cardiac markers every 8 hours EKG in a.m. Obtain d-dimer Start glucose stabilizer Obtain hemoglobin A1c Consult to diabetes coordinator Discontinue Janumet ISS  Further plan will depend as patient's clinical course evolves and further radiologic and laboratory data become available. Patient will be monitored closely.   DVT Prophylaxis: Lovenox  Code Status: Full code  Total time spent for admission equals 45 minutes.  Jonny Ruiz 08/23/2011, 8:35 PM

## 2011-08-23 NOTE — ED Notes (Signed)
Pt had 324mg  aspirin per 911 dispatch instructions and 3 sl nitro from ems.  Pt reported pain reduction and less nausea with this.

## 2011-08-23 NOTE — ED Notes (Signed)
CP that began 5 hours ago, associated with nausea.  Pt did not take her meds this morning.  CBG 366 for ems.  Pt is alert and oriented.  EKG wnl for ems

## 2011-08-23 NOTE — Consult Note (Signed)
HPI Patient with a history of CAD as described below.  The patient has a long history of chest discomfort. She reports that she takes nitroglycerin daily. She's had her usual chest pain pattern. She called our office to report to my nurse that she needed followup. She has several complaints. Predominantly she's describing blood sugars that have been in the 400s routinely and not well controlled. She's also had blood pressures not well-controlled. She does describe the chest discomfort but says it's a similar pattern to previous. She has substernal squeezing. She doesn't describe radiation. He can be 8/10 in intensity. It may take several NTG to go away. She doesn't describe radiation to her neck or to her arms. She said her most acute complaint was nausea and vomiting which happened over the last few days. She's not had any abdominal discomfort. She denies fevers or chills. She has had lower extremity swelling. She has been under a lot of stress with her husband who is being treated for cancer.   Allergies  Allergen Reactions  . Codeine Nausea And Vomiting  . Iohexol      Desc: pt had syncopal episode with nausea post IV CM late 1990's,  pt has had prednisone prep with heart caths x 2 without problem  kdean 04/16/07, Onset Date: [id number]   . Ticlid (Ticlopidine Hcl)     unknown    Current Facility-Administered Medications  Medication Dose Route Frequency Provider Last Rate Last Dose  . sodium chloride 0.9 % injection 3 mL  3 mL Intravenous Q12H Frances C. Sanford, [place]       And  . sodium chloride 0.9 % injection 3 mL  3 mL Intravenous PRN Scarlette Calico C. [place], [place]       And  . 0.9 %  sodium chloride infusion  250 mL Intravenous PRN Scarlette Calico C. [place], [place]      . aspirin chewable tablet 324 mg  324 mg Oral Once Scarlette Calico C. [place], [place]   324 mg at 08/23/11 1441  . insulin aspart (novoLOG) 100 UNIT/ML injection        10 Units at 08/23/11 1626  . insulin regular (HUMULIN R,NOVOLIN R) 100 units/mL  injection 10 Units  10 Units Subcutaneous Once Scarlette Calico C. [place], [place]      . metoprolol (LOPRESSOR) injection 5 mg  5 mg Intravenous Once Scarlette Calico C. [place]ford, [place]   5 mg at 08/23/11 1553  . nitroGLYCERIN (NITROSTAT) SL tablet 0.4 mg  0.4 mg Sublingual Q5 min PRN Scarlette Calico C. [place], [place]   0.4 mg at 08/23/11 1553  . ondansetron (ZOFRAN) injection 4 mg  4 mg Intravenous Once Scarlette Calico C. [place], [place]      . sodium chloride 0.9 % bolus 1,000 mL  1,000 mL Intravenous Once Jaci Carrel, [place]       Current Outpatient Prescriptions  Medication Sig Dispense Refill  . ALPRAZolam (XANAX) 0.5 MG tablet Take 0.5 mg by mouth 3 (three) times daily as needed. For anxiety      . aspirin 325 MG tablet Take 325 mg by mouth daily.        . clopidogrel (PLAVIX) 75 MG tablet Take 75 mg by mouth daily.       Marland Kitchen esomeprazole (NEXIUM) 40 MG capsule Take 40 mg by mouth daily before breakfast.        . ezetimibe (ZETIA) 10 MG tablet Take 10 mg by mouth daily.        . furosemide (LASIX) 40 MG tablet Take 40  mg by mouth 2 (two) times daily.        Marland Kitchen gabapentin (NEURONTIN) 300 MG capsule Take 300 mg by mouth 2 (two) times daily.        . insulin detemir (LEVEMIR) 100 UNIT/ML injection Inject 100 Units into the skin 2 (two) times daily.        . insulin lispro (HUMALOG) 100 UNIT/ML injection Inject 24-26 Units into the skin as directed.       . isosorbide mononitrate (IMDUR) 120 MG 24 hr tablet Take 120 mg by mouth daily.       Marland Kitchen lisinopril (PRINIVIL,ZESTRIL) 20 MG tablet Take 20 mg by mouth daily.        . nitroGLYCERIN (NITROSTAT) 0.4 MG SL tablet Place 0.4 mg under the tongue every 5 (five) minutes as needed. For chest pain      . potassium chloride SA (K-DUR,KLOR-CON) 20 MEQ tablet Take 20 mEq by mouth daily.        . rosuvastatin (CRESTOR) 40 MG tablet Take 40 mg by mouth daily.        . sitaGLIPtan-metformin (JANUMET) 50-500 MG per tablet Take 1 tablet by mouth 2 (two) times daily with a meal.       . Vitamin D,  Ergocalciferol, (DRISDOL) 50000 UNITS CAPS Take 50,000 Units by mouth every 7 (seven) days. friday       . zolpidem (AMBIEN) 10 MG tablet Take 10 mg by mouth at bedtime as needed. For sleep        Past Medical History  Diagnosis Date  . Hypokalemia   . Diverticulitis, colon   . MS (multiple sclerosis)     not confirmed  . Depression   . PVD (peripheral vascular disease)   . DM (diabetes mellitus)   . HLD (hyperlipidemia)   . HTN (hypertension)   . CAD (coronary artery disease)     Non STEMI,Taxus drug circumflex June 2008, non drug eluting stent left anterior descending in July as well as angioplasty to the  diagonal.  Diag 80% stenosis with PCI April 2011  . Chest pain   . Carotid artery plaque     Mild plaque  . IDDM (insulin dependent diabetes mellitus)   . GERD (gastroesophageal reflux disease)   . CHF (congestive heart failure)   . Prolapse of uterus   . TIA (transient ischemic attack)   . PAT (paroxysmal atrial tachycardia)   . Hemiplegic migraine   . Anxiety     Past Surgical History  Procedure Date  . Appendectomy   . Cholecystectomy   . Cardiac cath with angioplasty and stent 03/2007  . Partial hysterectomy 1986    ovaries left - prolaspe uterus   . Breast lumpectomy     Dr. Luan Moore     Family History  Problem Relation Age of Onset  . Heart attack Mother 22  . Diabetes Father   . Heart attack Father 79  . Heart attack Brother 30    x6    History   Social History  . Marital Status: Married    Spouse Name: N/A    Number of Children: N/A  . Years of Education: N/A   Occupational History  . Disability   .     Social History Main Topics  . Smoking status: Never Smoker   . Smokeless tobacco: Not on file  . Alcohol Use: No  . Drug Use: No  . Sexually Active: Not on file   Other Topics Concern  .  Not on file   Social History Narrative  . No narrative on file    ROS:  As stated in the HPI and negative for all other systems.   PHYSICAL  EXAM BP 118/89  Pulse 91  Temp(Src) 98.1 F (36.7 C) (Oral)  Resp 14  SpO2 97% GENERAL:  Well appearing HEENT:  Pupils equal round and reactive, fundi not visualized, oral mucosa unremarkable NECK:  No jugular venous distention, waveform within normal limits, carotid upstroke brisk and symmetric, no bruits, no thyromegaly LYMPHATICS:  No cervical, inguinal adenopathy LUNGS:  Clear to auscultation bilaterally BACK:  No CVA tenderness CHEST:  Unremarkable HEART:  PMI not displaced or sustained,S1 and S2 within normal limits, no S3, no S4, no clicks, no rubs, no murmurs ABD:  Flat, positive bowel sounds normal in frequency in pitch, no bruits, no rebound, no guarding, no midline pulsatile mass, no hepatomegaly, no splenomegaly EXT:  2 plus pulses throughout, no edema, no cyanosis no clubbing SKIN:  No rashes no nodules EURO:  Cranial nerves II through XII grossly intact, motor grossly intact throughout PSYCH:  Cognitively intact, oriented to person place and time  Lab Results  Component Value Date   WBC 8.3 08/23/2011   HGB 15.0 08/23/2011   HCT 44.0 08/23/2011   MCV 83.9 08/23/2011   PLT 147* 08/23/2011   Lab Results  Component Value Date   CKTOTAL 166 08/23/2011   CKMB 2.1 08/23/2011   TROPONINI <0.30 08/23/2011   Lab Results  Component Value Date   NA 138 08/23/2011   K 3.7 08/23/2011   CL 98 08/23/2011   CO2 24 01/12/2010   Lab Results  Component Value Date   CREATININE 0.70 08/23/2011   Lab Results  Component Value Date   BUN 14 08/23/2011     EKG:  Sinus rhythm, rate 112, axis within normal limits, intervals within normal limits, no acute ST-T wave changes, old inferior MI, poor anterior R wave progression, no acute ST T wave changes.  CXR:  NAD  ASSESSMENT AND PLAN  1)  Chest pain:  At present there is no objective evidence of ischemia. She does have chronic chest pain. She doesn't really think the pattern is different compared to the pain she had at the  time of her catheterization in April of last year. At this point she should be admitted and ruled out for microinfarction with serial enzymes. She should have a repeat EKG in the morning. However, if there is no objective evidence of ischemia further inpatient evaluation we'll BMI clinic.  2)  Diabetes:  Her blood sugar in the emergency room was greater than 400.  Ketones are pending. I think this is her most significant ongoing problem requiring treatment. I have discussed this with the emergency room providers. I would suggest admission for diabetes control. I will defer to the primary team.  3)  Hypertension:  For now she can continue her outpatient medications. However, pending her blood pressures in the hospital we might need to make further adjustments.

## 2011-08-23 NOTE — ED Notes (Signed)
CBG 267 

## 2011-08-23 NOTE — Telephone Encounter (Signed)
Pt calling back she did hear the phone yesterday please call her back she really needs to get this done soon

## 2011-08-23 NOTE — ED Notes (Signed)
CBG 368 

## 2011-08-24 ENCOUNTER — Inpatient Hospital Stay (HOSPITAL_COMMUNITY): Payer: Medicare Other

## 2011-08-24 ENCOUNTER — Other Ambulatory Visit: Payer: Self-pay

## 2011-08-24 DIAGNOSIS — E876 Hypokalemia: Secondary | ICD-10-CM | POA: Diagnosis present

## 2011-08-24 LAB — GLUCOSE, CAPILLARY
Glucose-Capillary: 129 mg/dL — ABNORMAL HIGH (ref 70–99)
Glucose-Capillary: 168 mg/dL — ABNORMAL HIGH (ref 70–99)
Glucose-Capillary: 171 mg/dL — ABNORMAL HIGH (ref 70–99)
Glucose-Capillary: 182 mg/dL — ABNORMAL HIGH (ref 70–99)
Glucose-Capillary: 234 mg/dL — ABNORMAL HIGH (ref 70–99)
Glucose-Capillary: 254 mg/dL — ABNORMAL HIGH (ref 70–99)
Glucose-Capillary: 284 mg/dL — ABNORMAL HIGH (ref 70–99)
Glucose-Capillary: 347 mg/dL — ABNORMAL HIGH (ref 70–99)

## 2011-08-24 LAB — CARDIAC PANEL(CRET KIN+CKTOT+MB+TROPI)
Relative Index: 1.6 (ref 0.0–2.5)
Relative Index: 1.5 (ref 0.0–2.5)
Total CK: 160 U/L (ref 7–177)
Total CK: 155 U/L (ref 7–177)
Troponin I: <0.3 ng/mL (ref <0.30)

## 2011-08-24 LAB — BASIC METABOLIC PANEL
CO2: 28 mEq/L (ref 19–32)
Chloride: 104 mEq/L (ref 96–112)
Creatinine, Ser: 0.55 mg/dL (ref 0.50–1.10)
GFR calc Af Amer: >90 mL/min (ref >90)
Sodium: 141 mEq/L (ref 135–145)

## 2011-08-24 LAB — HEMOGLOBIN A1C: Hgb A1c MFr Bld: 10.4 % — ABNORMAL HIGH (ref <5.7)

## 2011-08-24 MED ORDER — INSULIN ASPART 100 UNIT/ML ~~LOC~~ SOLN
20.0000 [IU] | Freq: Three times a day (TID) | SUBCUTANEOUS | Status: DC
  Administered 2011-08-24 – 2011-08-25 (×3): 20 [IU] via SUBCUTANEOUS

## 2011-08-24 MED ORDER — POTASSIUM CHLORIDE CRYS ER 20 MEQ PO TBCR
20.0000 meq | EXTENDED_RELEASE_TABLET | Freq: Once | ORAL | Status: AC
  Administered 2011-08-24: 20 meq via ORAL

## 2011-08-24 MED ORDER — INSULIN ASPART 100 UNIT/ML ~~LOC~~ SOLN: 0.0000 [IU] | Freq: Every day | SUBCUTANEOUS | Status: DC

## 2011-08-24 MED ORDER — INSULIN DETEMIR 100 UNIT/ML ~~LOC~~ SOLN
10.0000 [IU] | Freq: Once | SUBCUTANEOUS | Status: AC
  Administered 2011-08-24: 10 [IU] via SUBCUTANEOUS

## 2011-08-24 MED ORDER — INSULIN DETEMIR 100 UNIT/ML ~~LOC~~ SOLN: 50.0000 [IU] | Freq: Two times a day (BID) | SUBCUTANEOUS | Status: DC

## 2011-08-24 MED ORDER — TECHNETIUM TO 99M ALBUMIN AGGREGATED
6.0000 | Freq: Once | INTRAVENOUS | Status: AC | PRN
  Administered 2011-08-24: 6 via INTRAVENOUS

## 2011-08-24 MED ORDER — INSULIN DETEMIR 100 UNIT/ML ~~LOC~~ SOLN
60.0000 [IU] | Freq: Two times a day (BID) | SUBCUTANEOUS | Status: DC
  Administered 2011-08-24: 60 [IU] via SUBCUTANEOUS

## 2011-08-24 MED ORDER — INSULIN DETEMIR 100 UNIT/ML ~~LOC~~ SOLN
70.0000 [IU] | Freq: Two times a day (BID) | SUBCUTANEOUS | Status: DC
Start: 2011-08-24 — End: 2011-08-25
  Administered 2011-08-24 – 2011-08-25 (×2): 70 [IU] via SUBCUTANEOUS

## 2011-08-24 MED ORDER — XENON XE 133 GAS
10.0000 | GAS_FOR_INHALATION | Freq: Once | RESPIRATORY_TRACT | Status: AC | PRN
  Administered 2011-08-24: 10 via RESPIRATORY_TRACT

## 2011-08-24 MED ORDER — INSULIN ASPART 100 UNIT/ML ~~LOC~~ SOLN
0.0000 [IU] | Freq: Three times a day (TID) | SUBCUTANEOUS | Status: DC
  Administered 2011-08-24: 11 [IU] via SUBCUTANEOUS
  Administered 2011-08-24: 8 [IU] via SUBCUTANEOUS
  Administered 2011-08-25: 5 [IU] via SUBCUTANEOUS

## 2011-08-24 NOTE — Progress Notes (Signed)
   CARE MANAGEMENT NOTE 08/24/2011  Patient:  CARLON, DAVIDSON   Account Number:  [id number]  Date Initiated:  08/24/2011  Documentation initiated by:  Donn Pierini  Subjective/Objective Assessment:   Pt admitted with high blood sugars     Action/Plan:   PTA pt lived at home with spouse, was independent with ADLs   Anticipated DC Date:  08/26/2011   Anticipated DC Plan:  HOME/SELF CARE      DC Planning Services  CM consult      Choice offered to / List presented to:             Status of service:  In process, will continue to follow Medicare Important Message given?   (If response is "NO", the following Medicare IM given date fields will be blank) Date Medicare IM given:   Date Additional Medicare IM given:    Discharge Disposition:    Per UR Regulation:    Comments:  PCP- Rudi Heap  08/24/11- 1600- Donn Pierini RN, BSN [phone] Spoke with pt at bedside regarding concern about medication cost. Pt states that she spends alot on medications each month and is concerned about how much her meds cost. She is already on some drug company assistance programs for some of her meds. Encouraged her to speak with her PCP about her medications and concerns.

## 2011-08-24 NOTE — Clinical Documentation Improvement (Signed)
CHF DOCUMENTATION CLARIFICATION QUERY  THIS DOCUMENT IS NOT A PERMANENT PART OF THE MEDICAL RECORD  TO RESPOND TO THE THIS QUERY, FOLLOW THE INSTRUCTIONS BELOW:  1. If needed, update documentation for the patient's encounter via the notes activity.  2. Access this query again and click edit on the [employer].  3. After updating, or not, click F2 to complete all highlighted (required) fields concerning your review. Select "additional documentation in the medical record" OR "no additional documentation provided".  4. Click Sign note button.  5. The deficiency will fall out of your InBasket *Please let us know if you are not able to compete this workflow by phone or e-mail (listed below).  Please update your documentation within the medical record to reflect your response to this query.                                                                                    08/24/11  Dear Dr. Betti Cruz / Associates,  In a better effort to capture your patient's severity of illness, reflect appropriate length of stay and utilization of resources, a review of the patient medical record has revealed the following indicators the diagnosis of Heart Failure.    In responding to this query please exercise your independent judgment.  The fact that a query is asked, does not imply that any particular answer is desired or expected.  Possible Clinical Conditions?  Chronic Systolic Congestive Heart Failure Chronic Diastolic Congestive Heart Failure Chronic Systolic & Diastolic Congestive Heart Failure Acute Systolic Congestive Heart Failure Acute Diastolic Congestive Heart Failure Acute Systolic & Diastolic Congestive Heart Failure Acute on Chronic Systolic Congestive Heart Failure Acute on Chronic Diastolic Congestive Heart Failure Acute on Chronic Systolic & Diastolic  Congestive Heart Failure Other Condition________________________________________ Cannot Clinically Determine  Supporting  Information:  Risk Factors: Hx "CHF", CAD s/p MI, stents Diagnostics: BNP: 24.0    Echo results: EF: 01/2008 60-70%  Radiology: No active cardiopulm dz Treatment: Diuretics: Lasix 40mg  po bid start 11/30  Reviewed: additional documentation in the medical record  Thank You,  Beverley Fiedler RN   Clinical Documentation Specialist: Cell: [phone]  Health Information Management [place]

## 2011-08-24 NOTE — Progress Notes (Signed)
Utilization review complete 

## 2011-08-24 NOTE — Progress Notes (Addendum)
Subjective: No specific complaints.  Denies any chest pain or shortness of breath.  Wondering when she can go home.  Objective: Vital signs in last 24 hours: Filed Vitals:   08/23/11 1900 08/23/11 2000 08/23/11 2045 08/24/11 0542  BP: 174/73 168/48 189/102 143/79  Pulse: 86 90 90 82  Temp:   98.2 F (36.8 C) 97.9 F (36.6 C)  TempSrc:      Resp: 20 14 20 16   Height:   5\' 6"  (1.676 m)   Weight:   84.2 kg (185 lb 10 oz)   SpO2: 96% 97% 100% 97%   Weight change:   Intake/Output Summary (Last 24 hours) at 08/24/11 1419 Last data filed at 08/24/11 0608  Gross per 24 hour  Intake 588.73 ml  Output      0 ml  Net 588.73 ml   Physical Exam: General: Awake, Oriented, No acute distress. HEENT: EOMI. Neck: Supple CV: S1 and S2 Lungs: Clear to ascultation bilaterally Abdomen: Soft, Nontender, Nondistended, +bowel sounds. Ext: Good pulses. Trace edema.  Lab Results:  Basename 08/24/11 0653 08/23/11 2131 08/23/11 1521 08/23/11 1457  NA 141 -- 138 --  K 3.4* -- 3.7 --  CL 104 -- 98 --  CO2 28 -- -- --  GLUCOSE 156* -- 409* --  BUN 11 -- 14 --  CREATININE 0.55 0.58 -- --  CALCIUM 8.7 -- -- --  MG -- -- -- 1.6  PHOS -- -- -- --   No results found for this basename: AST:2,ALT:2,ALKPHOS:2,BILITOT:2,PROT:2,ALBUMIN:2 in the last 72 hours No results found for this basename: LIPASE:2,AMYLASE:2 in the last 72 hours  Basename 08/23/11 2131 08/23/11 1521 08/23/11 1457  WBC 6.2 -- 8.3  NEUTROABS -- -- --  HGB 13.3 15.0 --  HCT 37.8 44.0 --  MCV 84.2 -- 83.9  PLT 144* -- 147*    Basename 08/24/11 0940 08/23/11 2316 08/23/11 1457  CKTOTAL 136 160 166  CKMB 2.2 2.2 2.1  CKMBINDEX -- -- --  TROPONINI <0.30 <0.30 <0.30    Basename 08/23/11 1452  POCBNP 24.6    Basename 08/23/11 2131  DDIMER 0.83*    Basename 08/23/11 2131  HGBA1C 10.4*   No results found for this basename: CHOL:2,HDL:2,LDLCALC:2,TRIG:2,CHOLHDL:2,LDLDIRECT:2 in the last 72 hours No results found for  this basename: TSH,T4TOTAL,FREET3,T3FREE,THYROIDAB in the last 72 hours No results found for this basename: VITAMINB12:2,FOLATE:2,FERRITIN:2,TIBC:2,IRON:2,RETICCTPCT:2 in the last 72 hours  Micro Results: No results found for this or any previous visit (from the past 240 hour(s)).  Studies/Results: Nm Pulmonary Per & Vent  08/24/2011  *RADIOLOGY REPORT*  Clinical Data: shortness of breath with exertion.  Elevated D- dimer.  NM PULMONARY VENTILATION AND PERFUSION SCAN  Radiopharmaceutical: 10 mCi xenon-133 gas for ventilation.  6 mCi technetium 21m MAA for perfusion imaging.  Comparison: Chest x-ray from 08/23/2011  Findings: Multiplanar perfusion imaging shows normal bilateral lung perfusion.  Anterior and posterior ventilation imaging shows normal wash and distribution.  No substantial air trapping.  IMPRESSION: Normal bilateral lung perfusion.  Original Report Authenticated By: ERIC A. MANSELL, M.D.   Dg Chest Portable 1 View  08/23/2011  *RADIOLOGY REPORT*  Clinical Data: Chest pain  PORTABLE CHEST - 1 VIEW  Comparison: 02/09/2008  Findings: Lungs are under aerated and grossly clear.  Heart is normal in size.  No new thorax.  IMPRESSION: No active cardiopulmonary disease.  Original Report Authenticated By: Donavan Burnet, M.D.    Medications: I have reviewed the patient's current medications. Scheduled Meds:   . aspirin  324 mg Oral Once  . aspirin  325 mg Oral Daily  . clopidogrel  75 mg Oral Daily  . docusate sodium  100 mg Oral BID  . enoxaparin  40 mg Subcutaneous Q24H  . ezetimibe  10 mg Oral Daily  . furosemide  40 mg Oral BID WC  . gabapentin  300 mg Oral BID  . insulin aspart      . insulin aspart  0-15 Units Subcutaneous TID WC  . insulin aspart  0-5 Units Subcutaneous QHS  . insulin aspart  20 Units Subcutaneous TID WC  . insulin detemir  10 Units Subcutaneous Once  . insulin detemir  70 Units Subcutaneous BID  . insulin regular  10 Units Subcutaneous Once  . isosorbide  mononitrate  120 mg Oral Daily  . lisinopril  20 mg Oral Daily  . metoprolol  5 mg Intravenous Once  . ondansetron  4 mg Intravenous Once  . pantoprazole  40 mg Oral Daily  . potassium chloride SA  20 mEq Oral Daily  . potassium chloride  20 mEq Oral Once  . rosuvastatin  40 mg Oral Daily  . Vitamin D (Ergocalciferol)  50,000 Units Oral Q7 days  . DISCONTD: insulin detemir  100 Units Subcutaneous BID  . DISCONTD: insulin detemir  50 Units Subcutaneous BID  . DISCONTD: insulin detemir  60 Units Subcutaneous BID  . DISCONTD: insulin regular  0-10 Units Intravenous TID WC  . DISCONTD: sodium chloride  1,000 mL Intravenous Once  . DISCONTD: sodium chloride  3 mL Intravenous Q12H   Continuous Infusions:   . DISCONTD: sodium chloride    . DISCONTD: sodium chloride Stopped (08/24/11 0551)  . DISCONTD: dextrose 5 % and 0.45% NaCl 75 mL/hr at 08/24/11 0551  . DISCONTD: insulin (NOVOLIN-R) infusion 1.4 Units/hr (08/24/11 0648)   PRN Meds:.acetaminophen, acetaminophen, ALPRAZolam, alum & mag hydroxide-simeth, nitroGLYCERIN, ondansetron (ZOFRAN) IV, ondansetron, technetium albumin aggregated, xenon xe 133, zolpidem, DISCONTD: sodium chloride, DISCONTD: dextrose, DISCONTD: nitroGLYCERIN, DISCONTD: sodium chloride  Assessment/Plan: Principal Problem:  *Diabetes type 2, uncontrolled Active Problems:  Chest pain  HTN (hypertension)  1. Chest pain -  Patient ruled out for acute coronary syndrome with negative troponins. D-dimer was positive however VQ scan indicated low probability for pulmonary embolism.  Cardiology, Dr. Antoine Poche, evaluated the patient and thought that the patient had no objective evidence for ischemia. Further management as per cardiology as outpatient.    2. Uncontrolled diabetes - increase the dose of Levemir.  Patient was on insulin drip which has been discontinued as her blood sugars are better controlled.  Continue meal time insulin.  Had an extensive discussion with the  patient about perhaps considering insulin at home as outpatient.  Patient to discuss with her primary care physician who may consider referring the patient to an endocrinologist.    3.  Hypertension - uncontrolled however the patient has been on IV fluids which will be discontinued.  Continue home medications, depending on how the patient is over the next 24 hours may consider titrating antihypertensive medications.  4.  History of coronary disease with chronic diastolic compensated heart failure, stable ruled out for acute coronary syndrome.  5.  History of depression stable.  6.  History of hyperlipidemia, stable.  7.  Prophylaxis, Lovenox.  8.  Disposition.  Consider discharge tomorrow if her blood sugars are reasonably controlled.   LOS: 1 day  Rakim Moone A, MD 08/24/2011, 2:19 PM

## 2011-08-24 NOTE — Plan of Care (Signed)
Problem: Limited Adherence to Nutrition-Related Recommendations (NB-1.6) Goal: Nutrition education Formal process to instruct or train a patient/client in a skill or to impart knowledge to help patients/clients voluntarily manage or modify food choices and eating behavior to maintain or improve health.  Outcome: Completed/Met Date Met:  08/24/11 RD consult for DM diet education. Reviewed diet guidelines and recommendations. Handouts provided from [employer]. No nutrition problems identified PTA. No further nutrition intervention indicated at this time.

## 2011-08-24 NOTE — ED Provider Notes (Signed)
Medical screening examination/treatment/procedure(s) were performed by non-physician practitioner and as supervising physician I was immediately available for consultation/collaboration.   Benny Lennert, MD 08/24/11 [phone]

## 2011-08-24 NOTE — Progress Notes (Signed)
Inpatient Diabetes Program Recommendations  AACE/ADA: New Consensus Statement on Inpatient Glycemic Control (2009)  Target Ranges:  Prepandial:   less than 140 mg/dL      Peak postprandial:   less than 180 mg/dL (1-2 hours)      Critically ill patients:  140 - 180 mg/dL   Reason for Visit: Consult   Spoke with patient concerning A1C=10.4 patient reports difficulty with managing diabetes.  Encouraged patient to monitor CBGs frequently especially first thing in the morning (she was waiting until after breakfast).  Encouraged patient to watch DM videos on pt ed network for CHO counting.  Pt also reports taking Levemir 50 units BID at home and NOT 100 BID.  Patient seems motivated to manage her diabetes.   Thank you

## 2011-08-24 NOTE — ED Provider Notes (Signed)
Medical screening examination/treatment/procedure(s) were performed by non-physician practitioner and as supervising physician I was immediately available for consultation/collaboration.   Riot Waterworth L Clotilde Loth, MD 08/24/11 1336 

## 2011-08-25 LAB — BASIC METABOLIC PANEL
BUN: 13 mg/dL (ref 6–23)
Calcium: 8.9 mg/dL (ref 8.4–10.5)
GFR calc non Af Amer: >90 mL/min (ref >90)
Glucose, Bld: 278 mg/dL — ABNORMAL HIGH (ref 70–99)
Sodium: 137 mEq/L (ref 135–145)

## 2011-08-25 MED ORDER — INSULIN DETEMIR 100 UNIT/ML ~~LOC~~ SOLN: 70.0000 [IU] | Freq: Two times a day (BID) | SUBCUTANEOUS | Status: DC

## 2011-08-25 MED ORDER — POTASSIUM CHLORIDE CRYS ER 20 MEQ PO TBCR
20.0000 meq | EXTENDED_RELEASE_TABLET | Freq: Once | ORAL | Status: AC
  Administered 2011-08-25: 20 meq via ORAL
  Filled 2011-08-25: qty 1

## 2011-08-25 NOTE — Progress Notes (Signed)
Subjective: No specific complaints.  Denies any chest pain or shortness of breath.  Would like to be discharged today.  Objective: Vital signs in last 24 hours: Filed Vitals:   08/24/11 0542 08/24/11 1400 08/24/11 2100 08/25/11 0553  BP: 143/79 157/86 149/93 143/83  Pulse: 82 109 105 95  Temp: 97.9 F (36.6 C) 98.6 F (37 C) 97.2 F (36.2 C) 97.8 F (36.6 C)  TempSrc:   Oral Oral  Resp: 16 20 14 16   Height:      Weight:      SpO2: 97% 98% 96% 96%   Weight change:   Intake/Output Summary (Last 24 hours) at 08/25/11 0816 Last data filed at 08/24/11 1130  Gross per 24 hour  Intake    680 ml  Output      0 ml  Net    680 ml   Physical Exam: General: Awake, Oriented, No acute distress. HEENT: EOMI. Neck: Supple CV: S1 and S2 Lungs: Clear to ascultation bilaterally Abdomen: Soft, Nontender, Nondistended, +bowel sounds. Ext: Good pulses. Trace edema.  Lab Results:  Basename 08/25/11 0600 08/24/11 0653 08/23/11 1457  NA 137 141 --  K 3.4* 3.4* --  CL 100 104 --  CO2 28 28 --  GLUCOSE 278* 156* --  BUN 13 11 --  CREATININE 0.63 0.55 --  CALCIUM 8.9 8.7 --  MG 1.8 -- 1.6  PHOS -- -- --   No results found for this basename: AST:2,ALT:2,ALKPHOS:2,BILITOT:2,PROT:2,ALBUMIN:2 in the last 72 hours No results found for this basename: LIPASE:2,AMYLASE:2 in the last 72 hours  Basename 08/23/11 2131 08/23/11 1521 08/23/11 1457  WBC 6.2 -- 8.3  NEUTROABS -- -- --  HGB 13.3 15.0 --  HCT 37.8 44.0 --  MCV 84.2 -- 83.9  PLT 144* -- 147*    Basename 08/24/11 1513 08/24/11 0940 08/23/11 2316  CKTOTAL 155 136 160  CKMB 2.3 2.2 2.2  CKMBINDEX -- -- --  TROPONINI <0.30 <0.30 <0.30    Basename 08/23/11 1452  POCBNP 24.6    Basename 08/23/11 2131  DDIMER 0.83*    Basename 08/23/11 2131  HGBA1C 10.4*   No results found for this basename: CHOL:2,HDL:2,LDLCALC:2,TRIG:2,CHOLHDL:2,LDLDIRECT:2 in the last 72 hours No results found for this basename:  TSH,T4TOTAL,FREET3,T3FREE,THYROIDAB in the last 72 hours No results found for this basename: VITAMINB12:2,FOLATE:2,FERRITIN:2,TIBC:2,IRON:2,RETICCTPCT:2 in the last 72 hours  Micro Results: No results found for this or any previous visit (from the past 240 hour(s)).  Studies/Results: Nm Pulmonary Per & Vent  08/24/2011  *RADIOLOGY REPORT*  Clinical Data: shortness of breath with exertion.  Elevated D- dimer.  NM PULMONARY VENTILATION AND PERFUSION SCAN  Radiopharmaceutical: 10 mCi xenon-133 gas for ventilation.  6 mCi technetium 75m MAA for perfusion imaging.  Comparison: Chest x-ray from 08/23/2011  Findings: Multiplanar perfusion imaging shows normal bilateral lung perfusion.  Anterior and posterior ventilation imaging shows normal wash and distribution.  No substantial air trapping.  IMPRESSION: Normal bilateral lung perfusion.  Original Report Authenticated By: ERIC A. MANSELL, M.D.   Dg Chest Portable 1 View  08/23/2011  *RADIOLOGY REPORT*  Clinical Data: Chest pain  PORTABLE CHEST - 1 VIEW  Comparison: 02/09/2008  Findings: Lungs are under aerated and grossly clear.  Heart is normal in size.  No new thorax.  IMPRESSION: No active cardiopulmonary disease.  Original Report Authenticated By: Donavan Burnet, M.D.    Medications: I have reviewed the patient's current medications. Scheduled Meds:    . aspirin  325 mg Oral Daily  .  clopidogrel  75 mg Oral Daily  . docusate sodium  100 mg Oral BID  . enoxaparin  40 mg Subcutaneous Q24H  . ezetimibe  10 mg Oral Daily  . furosemide  40 mg Oral BID WC  . gabapentin  300 mg Oral BID  . insulin aspart  0-15 Units Subcutaneous TID WC  . insulin aspart  0-5 Units Subcutaneous QHS  . insulin aspart  20 Units Subcutaneous TID WC  . insulin detemir  10 Units Subcutaneous Once  . insulin detemir  70 Units Subcutaneous BID  . isosorbide mononitrate  120 mg Oral Daily  . lisinopril  20 mg Oral Daily  . ondansetron  4 mg Intravenous Once  .  pantoprazole  40 mg Oral Daily  . potassium chloride SA  20 mEq Oral Daily  . potassium chloride  20 mEq Oral Once  . rosuvastatin  40 mg Oral Daily  . Vitamin D (Ergocalciferol)  50,000 Units Oral Q7 days  . DISCONTD: insulin detemir  100 Units Subcutaneous BID  . DISCONTD: insulin detemir  50 Units Subcutaneous BID  . DISCONTD: insulin detemir  60 Units Subcutaneous BID  . DISCONTD: insulin regular  0-10 Units Intravenous TID WC   Continuous Infusions:    . DISCONTD: sodium chloride    . DISCONTD: sodium chloride Stopped (08/24/11 0551)  . DISCONTD: dextrose 5 % and 0.45% NaCl 75 mL/hr at 08/24/11 0551  . DISCONTD: insulin (NOVOLIN-R) infusion 1.4 Units/hr (08/24/11 0648)   PRN Meds:.acetaminophen, acetaminophen, ALPRAZolam, alum & mag hydroxide-simeth, nitroGLYCERIN, ondansetron (ZOFRAN) IV, ondansetron, technetium albumin aggregated, xenon xe 133, zolpidem, DISCONTD: sodium chloride, DISCONTD: dextrose, DISCONTD: sodium chloride  Assessment/Plan: 1. Chest pain -  Patient ruled out for acute coronary syndrome with negative troponins. VQ scan indicated low probability for pulmonary embolism.  Cardiology indicated low likelihood for ischemia. Further management as per cardiology as outpatient.    2. Uncontrolled diabetes - increase the dose of Levemir.  Patient was on insulin drip which has been discontinued as her blood sugars are better controlled.  Continue meal time insulin.  Had an extensive discussion with the patient about perhaps considering insulin pump as outpatient.  Patient to discuss with her primary care physician who may consider referring the patient to an endocrinologist.    3.  Hypertension - improved today, continue further titration as outpatient.  4.  History of coronary disease with chronic diastolic compensated heart failure, stable ruled out for acute coronary syndrome.  5.  History of depression stable.  6.  History of hyperlipidemia, stable.  7.   Prophylaxis, Lovenox.  8.  Disposition.  Discharge the patient home today.   LOS: 2 days  Lorriane Dehart A, MD 08/25/2011, 8:16 AM

## 2011-08-25 NOTE — Discharge Summary (Signed)
Discharge Summary  [name] MR#: [id number]  DOB:10-08-1950  Date of Admission: 08/23/2011 Date of Discharge: 08/25/2011  Patient's PCP: Karen Becton, MD, MD  Attending Physician:Jos Cygan A  Consults: Dr. Antoine Poche - Cardiology  Discharge Diagnoses: Principal Problem:  *Diabetes type 2, uncontrolled Active Problems:  Chest pain  HTN (hypertension)  Hypokalemia  history of coronary disease with chronic diastolic compensated heart failure. History of depression. History of hyperlipidemia. History of peripheral vascular disease. History of TIA. History of proximal atrial tachycardia. History of migraines. History of anxiety. History of diverticulitis.  Brief Admitting History and Physical [name] is a 60 year old Caucasian female with history of coronary disease with uncontrolled diabetes who presented with uncontrolled diabetes and chest pain.  Discharge Medications Current Discharge Medication List    CONTINUE these medications which have CHANGED   Details  insulin detemir (LEVEMIR) 100 UNIT/ML injection Inject 70 Units into the skin 2 (two) times daily. Qty: 50 mL, Refills: 0      CONTINUE these medications which have NOT CHANGED   Details  ALPRAZolam (XANAX) 0.5 MG tablet Take 0.5 mg by mouth 3 (three) times daily as needed. For anxiety    aspirin 325 MG tablet Take 325 mg by mouth daily.      clopidogrel (PLAVIX) 75 MG tablet Take 75 mg by mouth daily.     esomeprazole (NEXIUM) 40 MG capsule Take 40 mg by mouth daily before breakfast.      ezetimibe (ZETIA) 10 MG tablet Take 10 mg by mouth daily.      furosemide (LASIX) 40 MG tablet Take 40 mg by mouth 2 (two) times daily.      gabapentin (NEURONTIN) 300 MG capsule Take 300 mg by mouth 2 (two) times daily.      insulin lispro (HUMALOG) 100 UNIT/ML injection Inject 24-26 Units into the skin as directed.     isosorbide mononitrate (IMDUR) 120 MG 24 hr tablet Take 120 mg by mouth daily.       lisinopril (PRINIVIL,ZESTRIL) 20 MG tablet Take 20 mg by mouth daily.      nitroGLYCERIN (NITROSTAT) 0.4 MG SL tablet Place 0.4 mg under the tongue every 5 (five) minutes as needed. For chest pain    potassium chloride SA (K-DUR,KLOR-CON) 20 MEQ tablet Take 20 mEq by mouth daily.      rosuvastatin (CRESTOR) 40 MG tablet Take 40 mg by mouth daily.      sitaGLIPtan-metformin (JANUMET) 50-500 MG per tablet Take 1 tablet by mouth 2 (two) times daily with a meal.     Vitamin D, Ergocalciferol, (DRISDOL) 50000 UNITS CAPS Take 50,000 Units by mouth every 7 (seven) days. friday     zolpidem (AMBIEN) 10 MG tablet Take 10 mg by mouth at bedtime as needed. For sleep        Hospital Course: 1. Chest pain - Patient ruled out for acute coronary syndrome with negative troponins. VQ scan indicated low probability for pulmonary embolism. Cardiology indicated low likelihood for ischemia. Further management as per cardiology as outpatient.   2. Uncontrolled diabetes - Increase the dose of Levemir from 50 units subcutaneous twice daily to 70 units subcutaneous twice daily.  Patient was instructed to check her blood sugars at least 4 times a day to ensure that she is not hypoglycemic with new titration of her insulin.  Patient initially on admission was started on insulin drip to control her blood sugar after which her levemir dose was increased. Had an extensive discussion with the patient about  perhaps considering insulin pump as outpatient. Patient to discuss with her primary care physician who may consider referring the patient to an endocrinologist.   3. Hypertension - Initially was uncontrolled however after IV fluids were discontinued blood pressure improved.  Continue further titration as outpatient.   4. History of coronary disease with chronic diastolic compensated heart failure, stable ruled out for acute coronary syndrome.   5. History of depression stable.   6. History of hyperlipidemia,  stable.  7. Hypokalemia.  Replace as needed.  Day of Discharge BP 143/83  Pulse 95  Temp(Src) 97.8 F (36.6 C) (Oral)  Resp 16  Ht 5\' 6"  (1.676 m)  Wt 84.2 kg (185 lb 10 oz)  BMI 29.96 kg/m2  SpO2 96%  Results for orders placed during the hospital encounter of 08/23/11 (from the past 48 hour(s))  PRO B NATRIURETIC PEPTIDE     Status: Normal   Collection Time   08/23/11  2:52 PM      Component Value Range Comment   BNP, POC 24.6  0 - 125 (pg/mL)   CBC     Status: Abnormal   Collection Time   08/23/11  2:57 PM      Component Value Range Comment   WBC 8.3  4.0 - 10.5 (K/uL)    RBC 4.83  3.87 - 5.11 (MIL/uL)    Hemoglobin 14.4  12.0 - 15.0 (g/dL)    HCT 52.8  41.3 - 24.4 (%)    MCV 83.9  78.0 - 100.0 (fL)    MCH 29.8  26.0 - 34.0 (pg)    MCHC 35.6  30.0 - 36.0 (g/dL)    RDW 01.0  27.2 - 53.6 (%)    Platelets 147 (*) 150 - 400 (K/uL)   CARDIAC PANEL(CRET KIN+CKTOT+MB+TROPI)     Status: Normal   Collection Time   08/23/11  2:57 PM      Component Value Range Comment   Total CK 166  7 - 177 (U/L)    CK, MB 2.1  0.3 - 4.0 (ng/mL)    Troponin I <0.30  <0.30 (ng/mL)    Relative Index 1.3  0.0 - 2.5    MAGNESIUM     Status: Normal   Collection Time   08/23/11  2:57 PM      Component Value Range Comment   Magnesium 1.6  1.5 - 2.5 (mg/dL)   KETONES, QUALITATIVE     Status: Normal   Collection Time   08/23/11  2:57 PM      Component Value Range Comment   Acetone, Bld NEGATIVE  NEGATIVE    POCT I-STAT, CHEM 8     Status: Abnormal   Collection Time   08/23/11  3:21 PM      Component Value Range Comment   Sodium 138  135 - 145 (mEq/L)    Potassium 3.7  3.5 - 5.1 (mEq/L)    Chloride 98  96 - 112 (mEq/L)    BUN 14  6 - 23 (mg/dL)    Creatinine, Ser 6.44  0.50 - 1.10 (mg/dL)    Glucose, Bld 034 (*) 70 - 99 (mg/dL)    Calcium, Ion 7.42 (*) 1.12 - 1.32 (mmol/L)    TCO2 29  0 - 100 (mmol/L)    Hemoglobin 15.0  12.0 - 15.0 (g/dL)    HCT 59.5  63.8 - 75.6 (%)   GLUCOSE,  CAPILLARY     Status: Abnormal   Collection Time   08/23/11  3:38 PM  Component Value Range Comment   Glucose-Capillary 358 (*) 70 - 99 (mg/dL)   GLUCOSE, CAPILLARY     Status: Abnormal   Collection Time   08/23/11  6:05 PM      Component Value Range Comment   Glucose-Capillary 267 (*) 70 - 99 (mg/dL)   GLUCOSE, CAPILLARY     Status: Abnormal   Collection Time   08/23/11  8:55 PM      Component Value Range Comment   Glucose-Capillary 373 (*) 70 - 99 (mg/dL)   D-DIMER, QUANTITATIVE     Status: Abnormal   Collection Time   08/23/11  9:31 PM      Component Value Range Comment   D-Dimer, Quant 0.83 (*) 0.00 - 0.48 (ug/mL-FEU)   HEMOGLOBIN A1C     Status: Abnormal   Collection Time   08/23/11  9:31 PM      Component Value Range Comment   Hemoglobin A1C 10.4 (*) <5.7 (%)    Mean Plasma Glucose 252 (*) <117 (mg/dL)   CBC     Status: Abnormal   Collection Time   08/23/11  9:31 PM      Component Value Range Comment   WBC 6.2  4.0 - 10.5 (K/uL)    RBC 4.49  3.87 - 5.11 (MIL/uL)    Hemoglobin 13.3  12.0 - 15.0 (g/dL)    HCT 16.1  09.6 - 04.5 (%)    MCV 84.2  78.0 - 100.0 (fL)    MCH 29.6  26.0 - 34.0 (pg)    MCHC 35.2  30.0 - 36.0 (g/dL)    RDW 40.9  81.1 - 91.4 (%)    Platelets 144 (*) 150 - 400 (K/uL)   CREATININE, SERUM     Status: Normal   Collection Time   08/23/11  9:31 PM      Component Value Range Comment   Creatinine, Ser 0.58  0.50 - 1.10 (mg/dL)    GFR calc non Af Amer >90  >90 (mL/min)    GFR calc Af Amer >90  >90 (mL/min)   GLUCOSE, CAPILLARY     Status: Abnormal   Collection Time   08/23/11 11:12 PM      Component Value Range Comment   Glucose-Capillary 373 (*) 70 - 99 (mg/dL)   CARDIAC PANEL(CRET KIN+CKTOT+MB+TROPI)     Status: Normal   Collection Time   08/23/11 11:16 PM      Component Value Range Comment   Total CK 160  7 - 177 (U/L)    CK, MB 2.2  0.3 - 4.0 (ng/mL)    Troponin I <0.30  <0.30 (ng/mL)    Relative Index 1.4  0.0 - 2.5    GLUCOSE,  CAPILLARY     Status: Abnormal   Collection Time   08/24/11 12:24 AM      Component Value Range Comment   Glucose-Capillary 347 (*) 70 - 99 (mg/dL)   GLUCOSE, CAPILLARY     Status: Abnormal   Collection Time   08/24/11  1:29 AM      Component Value Range Comment   Glucose-Capillary 259 (*) 70 - 99 (mg/dL)   GLUCOSE, CAPILLARY     Status: Abnormal   Collection Time   08/24/11  2:32 AM      Component Value Range Comment   Glucose-Capillary 234 (*) 70 - 99 (mg/dL)   GLUCOSE, CAPILLARY     Status: Abnormal   Collection Time   08/24/11  3:39 AM  Component Value Range Comment   Glucose-Capillary 254 (*) 70 - 99 (mg/dL)   GLUCOSE, CAPILLARY     Status: Abnormal   Collection Time   08/24/11  4:43 AM      Component Value Range Comment   Glucose-Capillary 171 (*) 70 - 99 (mg/dL)   GLUCOSE, CAPILLARY     Status: Normal   Collection Time   08/24/11  5:46 AM      Component Value Range Comment   Glucose-Capillary 93  70 - 99 (mg/dL)   GLUCOSE, CAPILLARY     Status: Abnormal   Collection Time   08/24/11  6:47 AM      Component Value Range Comment   Glucose-Capillary 129 (*) 70 - 99 (mg/dL)   BASIC METABOLIC PANEL     Status: Abnormal   Collection Time   08/24/11  6:53 AM      Component Value Range Comment   Sodium 141  135 - 145 (mEq/L)    Potassium 3.4 (*) 3.5 - 5.1 (mEq/L)    Chloride 104  96 - 112 (mEq/L)    CO2 28  19 - 32 (mEq/L)    Glucose, Bld 156 (*) 70 - 99 (mg/dL)    BUN 11  6 - 23 (mg/dL)    Creatinine, Ser 1.61  0.50 - 1.10 (mg/dL)    Calcium 8.7  8.4 - 10.5 (mg/dL)    GFR calc non Af Amer >90  >90 (mL/min)    GFR calc Af Amer >90  >90 (mL/min)   GLUCOSE, CAPILLARY     Status: Abnormal   Collection Time   08/24/11  7:50 AM      Component Value Range Comment   Glucose-Capillary 168 (*) 70 - 99 (mg/dL)   GLUCOSE, CAPILLARY     Status: Abnormal   Collection Time   08/24/11  9:05 AM      Component Value Range Comment   Glucose-Capillary 247 (*) 70 - 99 (mg/dL)    CARDIAC PANEL(CRET KIN+CKTOT+MB+TROPI)     Status: Normal   Collection Time   08/24/11  9:40 AM      Component Value Range Comment   Total CK 136  7 - 177 (U/L)    CK, MB 2.2  0.3 - 4.0 (ng/mL)    Troponin I <0.30  <0.30 (ng/mL)    Relative Index 1.6  0.0 - 2.5    GLUCOSE, CAPILLARY     Status: Abnormal   Collection Time   08/24/11 10:12 AM      Component Value Range Comment   Glucose-Capillary 284 (*) 70 - 99 (mg/dL)   GLUCOSE, CAPILLARY     Status: Abnormal   Collection Time   08/24/11 11:53 AM      Component Value Range Comment   Glucose-Capillary 326 (*) 70 - 99 (mg/dL)   CARDIAC PANEL(CRET KIN+CKTOT+MB+TROPI)     Status: Normal   Collection Time   08/24/11  3:13 PM      Component Value Range Comment   Total CK 155  7 - 177 (U/L)    CK, MB 2.3  0.3 - 4.0 (ng/mL)    Troponin I <0.30  <0.30 (ng/mL)    Relative Index 1.5  0.0 - 2.5    GLUCOSE, CAPILLARY     Status: Abnormal   Collection Time   08/24/11  4:31 PM      Component Value Range Comment   Glucose-Capillary 284 (*) 70 - 99 (mg/dL)   GLUCOSE, CAPILLARY  Status: Abnormal   Collection Time   08/24/11 10:15 PM      Component Value Range Comment   Glucose-Capillary 182 (*) 70 - 99 (mg/dL)    Comment 1 Notify RN      Comment 2 Documented in Chart     BASIC METABOLIC PANEL     Status: Abnormal   Collection Time   08/25/11  6:00 AM      Component Value Range Comment   Sodium 137  135 - 145 (mEq/L)    Potassium 3.4 (*) 3.5 - 5.1 (mEq/L)    Chloride 100  96 - 112 (mEq/L)    CO2 28  19 - 32 (mEq/L)    Glucose, Bld 278 (*) 70 - 99 (mg/dL)    BUN 13  6 - 23 (mg/dL)    Creatinine, Ser 1.61  0.50 - 1.10 (mg/dL)    Calcium 8.9  8.4 - 10.5 (mg/dL)    GFR calc non Af Amer >90  >90 (mL/min)    GFR calc Af Amer >90  >90 (mL/min)   MAGNESIUM     Status: Normal   Collection Time   08/25/11  6:00 AM      Component Value Range Comment   Magnesium 1.8  1.5 - 2.5 (mg/dL)   GLUCOSE, CAPILLARY     Status: Abnormal    Collection Time   08/25/11  8:07 AM      Component Value Range Comment   Glucose-Capillary 250 (*) 70 - 99 (mg/dL)     Nm Pulmonary Per & Vent  08/24/2011  *RADIOLOGY REPORT*  Clinical Data: shortness of breath with exertion.  Elevated D- dimer.  NM PULMONARY VENTILATION AND PERFUSION SCAN  Radiopharmaceutical: 10 mCi xenon-133 gas for ventilation.  6 mCi technetium 64m MAA for perfusion imaging.  Comparison: Chest x-ray from 08/23/2011  Findings: Multiplanar perfusion imaging shows normal bilateral lung perfusion.  Anterior and posterior ventilation imaging shows normal wash and distribution.  No substantial air trapping.  IMPRESSION: Normal bilateral lung perfusion.  Original Report Authenticated By: ERIC A. MANSELL, M.D.   Dg Chest Portable 1 View  08/23/2011  *RADIOLOGY REPORT*  Clinical Data: Chest pain  PORTABLE CHEST - 1 VIEW  Comparison: 02/09/2008  Findings: Lungs are under aerated and grossly clear.  Heart is normal in size.  No new thorax.  IMPRESSION: No active cardiopulmonary disease.  Original Report Authenticated By: Donavan Burnet, M.D.     Disposition: Home  Diet: Diabetic diet  Activity: Resume as tolerated   Follow-up Appts: Discharge Orders    Future Orders Please Complete By Expires   Diet - low sodium heart healthy      Diet Carb Modified      Increase activity slowly      Discharge instructions      Comments:   Followup with Karen Becton, MD (PCP) in 1 week.  Discussed with your primary care physician about consideration for referral to endocrinology and for consideration of insulin pump.      TESTS THAT NEED FOLLOW-UP None  Time spent on discharge, talking to the patient, and coordinating care: 25 mins.   Signed: Cristal Ford, MD 08/25/2011, 8:25 AM

## 2011-12-18 ENCOUNTER — Encounter: Payer: Self-pay | Admitting: Gastroenterology

## 2012-01-01 ENCOUNTER — Telehealth: Payer: Self-pay | Admitting: Internal Medicine

## 2012-01-01 ENCOUNTER — Ambulatory Visit (INDEPENDENT_AMBULATORY_CARE_PROVIDER_SITE_OTHER): Payer: Medicare Other | Admitting: Gastroenterology

## 2012-01-01 ENCOUNTER — Encounter: Payer: Self-pay | Admitting: Gastroenterology

## 2012-01-01 DIAGNOSIS — K5900 Constipation, unspecified: Secondary | ICD-10-CM | POA: Insufficient documentation

## 2012-01-01 DIAGNOSIS — K6289 Other specified diseases of anus and rectum: Secondary | ICD-10-CM | POA: Insufficient documentation

## 2012-01-01 DIAGNOSIS — R198 Other specified symptoms and signs involving the digestive system and abdomen: Secondary | ICD-10-CM | POA: Insufficient documentation

## 2012-01-01 DIAGNOSIS — R1032 Left lower quadrant pain: Secondary | ICD-10-CM | POA: Insufficient documentation

## 2012-01-01 DIAGNOSIS — K219 Gastro-esophageal reflux disease without esophagitis: Secondary | ICD-10-CM | POA: Insufficient documentation

## 2012-01-01 DIAGNOSIS — K5730 Diverticulosis of large intestine without perforation or abscess without bleeding: Secondary | ICD-10-CM | POA: Insufficient documentation

## 2012-01-01 MED ORDER — POLYETHYLENE GLYCOL 3350 17 GM/SCOOP PO POWD: 17.0000 g | Freq: Every day | ORAL | Status: AC

## 2012-01-01 MED ORDER — PEG-KCL-NACL-NASULF-NA ASC-C 100 G PO SOLR: 1.0000 | Freq: Once | ORAL | Status: DC

## 2012-01-01 NOTE — Progress Notes (Signed)
Addended by: Cherene Julian D on: 01/01/2012 04:10 PM   Modules accepted: Orders

## 2012-01-01 NOTE — Assessment & Plan Note (Signed)
?  abnormal abdominal exam. Increased density in luq without discrete mass. Nontender. Colonoscopy as planned. Will have Dr. Jena Gauss examine patient to give his opinion of whether she needs further evaluation with CT. Patient requires premedication with IV contrast.

## 2012-01-01 NOTE — Assessment & Plan Note (Signed)
6 month history of change in bowel function. Complains of persistent left lower quadrant and rectal pain unrelated to meals or bowel movements. Worsening constipation. May go one week without a bowel movement and then has to take a laxative. Has not seen any blood in her stool. Incomplete colonoscopy in 2002 as noted above. Patient states she had adequate conscious sedation for her colonoscopy but previously "woke up" during a breast biopsy. Therefore we will provide her with Phenergan 25 mg IV 30 minutes before the procedure to augment conscious sedation. Patient is in agreement with this approach. Day of bowel prep she will take half dose of Levemir, Janumet, Humalog.   I have discussed the risks, alternatives, benefits with regards to but not limited to the risk of reaction to medication, bleeding, infection, perforation and the patient is agreeable to proceed. Written consent to be obtained.

## 2012-01-01 NOTE — Progress Notes (Signed)
Faxed to PCP

## 2012-01-01 NOTE — Telephone Encounter (Signed)
Pt was seen today and called back to speak with CD. I told patient the CD would be out of the office until 130 pm and I would have her call patient back.

## 2012-01-01 NOTE — Patient Instructions (Signed)
We have scheduled you for a colonoscopy. Please see separate instructions. Please start MiraLax 17 g daily for constipation. He may take twice daily for the first 3 days, then drop back to once daily as needed. Consume high fiber diet and plenty of liquids.  How to Increase Fiber in the Meal Plan for Diabetes Increasing fiber in the diet has many benefits including lowering blood cholesterol, helping to control blood glucose (sugar), preventing constipation, and aiding in weight management by helping you feel full longer. Start adding fiber to your diet slowly. A gradual substitution of high fiber foods for low fiber foods will allow the digestive tract to adjust. Most men under 21 years of age should aim to eat 38 g of fiber a day. Women should aim for 25 g. Over 101 years of age, most men need 30 g of fiber and most women need 21 g. Below are some suggestions for increasing fiber.  Try whole-wheat bread instead of white bread. Look for words high on the list of ingredients such as whole wheat, whole rye, or whole oats.   Try baked potato with skin instead of mashed potatoes.   Try a fresh apple with skin instead of applesauce.   Try a fresh orange instead of orange juice.   Try popcorn instead of potato chips.   Try bran cereal instead of corn flakes.   Try kidney, whole pinto, or garbanzo beans instead of bread.   Try whole-grain crackers instead of saltine crackers.   Try whole-wheat pasta instead of regular varieties.   While on a high fiber diet, drink enough water and fluids to keep your urine clear or pale yellow.   Eat a variety of high fiber foods such as fruits, vegetables, whole grains, nuts, and seeds.   Try to increase your intake of fiber by eating high fiber foods instead of taking fiber pills or supplements that contain small amounts of fiber. There can be additional benefits for long-term health and blood glucose control with high fiber foods. Aim for 5 servings of fruit  and vegetables per day.  SOURCES OF FIBER The following list shows the average dietary fiber for types of food in the various food groups. Starch and Bread / Dietary Fiber (g)  Whole-grain breads, 1 slice / 2 g   Whole grain,  cup / 2 g   Whole-grain cereals,  cup / 3 g   Bran cereals, ? to  cup / 8 g   Starchy vegetables,  cup / 3 g   Legumes (beans, peas, lentils),  cup / 8 g   Oatmeal,  cup / 2 g   Whole-wheat pasta, ? cup / 2 g   [place] rice,  cup / 2 g   Barley,  cup / 3 g  Meat and Meat Substitutes / Dietary Fiber (g) This group averages 0 grams of fiber. Exceptions are:  Nuts, seeds, 1 oz or  cup / 3 g   Chunky peanut butter, 2 tbs / 3 g  Vegetables / Dietary Fiber (g)  Cooked vegetables,  to  cup / 2 to 3 g   Raw vegetables, 1 to 2 cups / 2 to 3 g  Fruit / Dietary Fiber (g)  Raw or cooked fruit,  cup or 1 small, fresh piece / 2 g  Milk / Dietary Fiber (g)  Milk, 1 cup or 8 oz / 0 g  Fats and Oils / Dietary Fiber (g)  Fats and oils, 1 tsp / 0  g  You can determine how much fiber you are eating by reading the Nutrition Facts panel on the labels of the foods you eat. FIBER IN SPECIFIC FOODS Cereals / Dietary Fiber (g)  All Bran, ? cup / 9 g   All Bran with Extra Fiber,  cup / 13 g   Bran Flakes,  cup / 4 g   Cheerios,  cup / 1.5 g   Corn Bran,  cup / 4 g   Corn Flakes,  cup / 0.75 g   Cracklin' Oat Bran,  cup / 4 g   Fiber One,  cup / 13 g   Grape Nuts, 3 tbs / 3 g   Grape Nuts Flakes,  cup / 3 g   Noodles,  cup, cooked / 0.5 g   Nutrigrain Wheat,  cup / 3.5 g   Oatmeal,  cup, cooked / 1.1 g   Pasta, white (macaroni, spaghetti),  cup, cooked / 0.5 g   Pasta, whole-wheat (macaroni, spaghetti),  cup, cooked / 2 g   [place],  cup, cooked / 3 g   Rice, wild, ? cup, cooked / 0.5 g   Rice, [place],  cup, cooked / 1 g   Rice, white,  cup, cooked / 0.2 g   Shredded Wheat, bite-sized,  cup / 2 g     Total,  cup / 1.75 g   Wheat Chex,  cup / 2.5 g   [place],  cup, cooked / 4 g   Wheaties,  cup / 2.75 g  Bread, Starchy Vegetables, and Dried Peas and Beans / Dietary Fiber (g)  Bagel, whole / 0.6 g   Baked beans in tomato sauce,  cup, cooked / 3 g   Bran muffin, 1 small / 2.5 g   Bread, cracked wheat, 1 slice / 2.5 g   Bread, pumpernickel, 1 slice / 2.5 g   Bread, white, 1 slice / 0.4 g   Bread, whole-wheat, 1 slice / 1.4 g   Corn,  cup, canned / 2.9 g   Kidney beans, ? cup, cooked / 3.5 g   Lentils, ? cup, cooked / 3 g   Lima beans,  cup, cooked / 4 g   Navy beans, ? cup, cooked / 4 g   Peas,  cup, cooked / 4 g   Popcorn, 3 cups popped, unbuttered / 3.5 g   Potato, baked (with skin), 1 small / 4 g   Potato, baked (without skin), 1 small / 2 g   Ry-Krisp, 4 crackers / 3 g   Saltine crackers, 6 squares / 0 g   Split peas, ? cup, cooked / 2.5 g   Yams (sweet potato), ? cup / 1.7 g  Fruit / Dietary Fiber (g)  Apple, 1 small, fresh, with skin / 4 g   Apple juice,  cup / 0.4 g   Apricots, 4 medium, fresh / 4 g   Apricots, 7 halves, dried / 2 g   Banana,  medium / 1.2 g   Blueberries,  cup / 2 g   Cantaloupe, ? melon / 1.3 g   Cherries,  cup, canned / 1.4 g   Grapefruit,  medium / 1.6 g   Grapes, 15 small / 1.2 g   Grape juice, ? cup / 0.5 g   Orange, 1 medium, fresh / 2 g   Orange juice,  cup / 0.5 g   Peach, 1 medium,fresh, with skin / 2 g   Pear, 1  medium, fresh, with skin / 4 g   Pineapple, ? cup, canned / 0.7 g   Plums, 2 whole / 2 g   Prunes, 3 whole / 1.5 g   Raspberries, 1 cup / 6 g   Strawberries, 1  cup / 4 g   Watermelon, 1  cup / 0.5 g  Vegetables / Dietary Fiber (g)  Asparagus,  cup, cooked / 1 g   Beans, green and wax,  cup, cooked / 1.6 g   Beets,  cup, cooked / 1.8 g   Broccoli,  cup, cooked / 2.2 g   Brussels sprouts,  cup, cooked / 4 g   Cabbage,  cup, cooked / 2.5 g    Carrots,  cup, cooked / 2.3 g   Cauliflower,  cup, cooked / 1.1 g   Celery, 1 cup, raw / 1.5 g   Cucumber, 1 cup, raw / 0.8 g   Green pepper,  cup sliced, cooked / 1.5 g   Lettuce, 1 cup, sliced / 0.9 g   Mushrooms, 1 cup sliced, raw / 1.8 g   Onion, 1 cup sliced, raw / 1.6 g   Spinach,  cup, cooked / 2.4 g   Tomato, 1 medium, fresh / 1.5 g   Tomato juice,  cup / 0 g   Zucchini,  cup, cooked / 1.8 g  Document Released: 03/02/2002 Document Revised: 08/30/2011 Document Reviewed: 03/29/2009 [facility] Patient Information 2012 [place], [place].

## 2012-01-01 NOTE — Progress Notes (Signed)
Primary Care Physician:  Rudi Heap, MD, MD  Primary Gastroenterologist:  Roetta Sessions, MD   Chief Complaint  Patient presents with  . Constipation  . Gas  . Bloated    HPI:  [name] is a 61 y.o. female here to schedule colonoscopy. She states [name] has been on her for years to have one done. Last attempted colonoscopy was by Dr. Linna Darner in 2002. She had severe diverticular changes of the sigmoid and descending colon and scattered diverticular changes throughout the rest of the colon. The tip of the scope could not be tipped into the cecal area. Patient denies followup barium enema to augment study  Heme negative per PCP recently. Complains a 6 month history of LLQ discomfort pressure all the time unrelated to meals or BMs. Rectal pressure all the time. Not relieved by BM. BM once per week, laxatives not providing relief. No melena, brbpr. Every 6 weeks or so some diarrhea and vomiting but seems to be related to high sugars. May be getting insulin pump. No heartburn on Nexium but doesn't take on a regular basis. Denies frequent heartburn on a chronic basis. May go weeks without Nexium. H/O ulcers per patient but no prior EGDs.   Current Outpatient Prescriptions  Medication Sig Dispense Refill  . ALPRAZolam (XANAX) 0.5 MG tablet Take 0.5 mg by mouth 3 (three) times daily as needed. For anxiety      . aspirin 325 MG tablet Take 325 mg by mouth daily.        . clopidogrel (PLAVIX) 75 MG tablet Take 75 mg by mouth daily.       Marland Kitchen esomeprazole (NEXIUM) 40 MG capsule Take 40 mg by mouth daily before breakfast.        . ezetimibe (ZETIA) 10 MG tablet Take 10 mg by mouth daily.        . furosemide (LASIX) 40 MG tablet Take 40 mg by mouth 2 (two) times daily.        Marland Kitchen gabapentin (NEURONTIN) 300 MG capsule Take 300 mg by mouth 2 (two) times daily.        . insulin detemir (LEVEMIR) 100 UNIT/ML injection Inject 70 Units into the skin 2 (two) times daily.  50 mL  0  . insulin lispro  (HUMALOG) 100 UNIT/ML injection Inject 24-26 Units into the skin as directed.       Marland Kitchen lisinopril (PRINIVIL,ZESTRIL) 20 MG tablet Take 20 mg by mouth daily.        . nitroGLYCERIN (NITROSTAT) 0.4 MG SL tablet Place 0.4 mg under the tongue every 5 (five) minutes as needed. For chest pain      . potassium chloride SA (K-DUR,KLOR-CON) 20 MEQ tablet Take 20 mEq by mouth daily.        . rosuvastatin (CRESTOR) 40 MG tablet Take 40 mg by mouth daily.        . sitaGLIPtan-metformin (JANUMET) 50-500 MG per tablet Take 1 tablet by mouth 2 (two) times daily with a meal.       . Vitamin D, Ergocalciferol, (DRISDOL) 50000 UNITS CAPS Take 50,000 Units by mouth every 7 (seven) days. friday       . zolpidem (AMBIEN) 10 MG tablet Take 10 mg by mouth at bedtime as needed. For sleep      . isosorbide mononitrate (IMDUR) 120 MG 24 hr tablet Take 120 mg by mouth daily.       .         Allergies as  of 01/01/2012 - Review Complete 01/01/2012  Allergen Reaction Noted  . Codeine Nausea And Vomiting 08/26/2008  . Iohexol  04/16/2007  . Ticlid (ticlopidine hcl)  02/02/2011    Past Medical History  Diagnosis Date  . Hypokalemia   . Diverticulitis, colon   . MS (multiple sclerosis)     not confirmed  . Depression   . PVD (peripheral vascular disease)   . HLD (hyperlipidemia)   . HTN (hypertension)   . CAD (coronary artery disease)     Non STEMI,Taxus drug circumflex June 2008, non drug eluting stent left anterior descending in July as well as angioplasty to the  diagonal.  Diag 80% stenosis with PCI April 2011  . Chest pain   . Carotid artery plaque     Mild plaque  . IDDM (insulin dependent diabetes mellitus)   . GERD (gastroesophageal reflux disease)   . CHF (congestive heart failure)   . Prolapse of uterus   . TIA (transient ischemic attack)   . PAT (paroxysmal atrial tachycardia)   . Hemiplegic migraine   . Anxiety     Past Surgical History  Procedure Date  . Appendectomy   . Cholecystectomy     . Cardiac cath with angioplasty and stent 03/2007  . Partial hysterectomy 1986    ovaries remain - prolaspe uterus   . Breast lumpectomy     Dr. Luan Moore   . Colonoscopy 2002    Dr. Anwar--> Severe diverticular changes in the region of the sigmoid and descending colon with scattered diverticular changes throughout the rest of the colon. No polyps, ulcerations. Despite numerous manipulations, the tip of the scope could not be tipped into the cecal area.    Family History  Problem Relation Age of Onset  . Heart attack Mother 52  . Diabetes Father   . Heart attack Father 1  . Heart attack Brother 30    x6  . Colon cancer Paternal Aunt     44s, died with brain anuerysm  . Crohn's disease Cousin     paternal    History   Social History  . Marital Status: Married    Spouse Name: N/A    Number of Children: 4  . Years of Education: N/A   Occupational History  . Disability   .     Social History Main Topics  . Smoking status: Never Smoker   . Smokeless tobacco: Not on file  . Alcohol Use: No  . Drug Use: No  . Sexually Active: Not on file   Other Topics Concern  . Not on file   Social History Narrative  . No narrative on file    ROS:  General: Negative for anorexia, weight loss, fever, chills, fatigue, weakness. Eyes: Negative for vision changes.  ENT: Negative for hoarseness, difficulty swallowing , nasal congestion. CV: Negative for chest pain, angina, palpitations, dyspnea on exertion, peripheral edema. Patient has a history of chronic chest pain but states it has settled down for the past several months. Respiratory: Negative for dyspnea at rest, dyspnea on exertion, cough, sputum, wheezing.  GI: See history of present illness. GU:  Negative for dysuria, hematuria, urinary incontinence, urinary frequency, nocturnal urination.  MS: Negative for joint pain. Mild intermittent low back pain.  Derm: Negative for rash or itching.  Neuro: Negative for weakness,  abnormal sensation, seizure, frequent headaches, memory loss, confusion. States she was told she likely has him as but workup is inconclusive. States she had a positive MRI  But negative CSF analysis. Complains of frequent falls. Psych: Negative for anxiety, depression, suicidal ideation, hallucinations.  Endo: Negative for unusual weight change.  Heme: Negative for bruising or bleeding. Allergy: Negative for rash or hives.    Physical Examination:  BP 127/74  Pulse 66  Temp(Src) 98 F (36.7 C) (Temporal)  Ht 5\' 6"  (1.676 m)  Wt 183 lb 12.8 oz (83.371 kg)  BMI 29.67 kg/m2   General: Well-nourished, well-developed in no acute distress. Very talkative. Upbeat. Head: Normocephalic, atraumatic.   Eyes: Conjunctiva pink, no icterus. Mouth: Oropharyngeal mucosa moist and pink , no lesions erythema or exudate. Neck: Supple without thyromegaly, masses, or lymphadenopathy.  Lungs: Clear to auscultation bilaterally.  Heart: Regular rate and rhythm, no murmurs rubs or gallops.  Abdomen: Bowel sounds are normal, minimal left lower quadrant tenderness. She has increased density in LUQ compared to remainder of abdomen. Feels doughy and firm without discrete mass. nontender, nondistended, no hepatosplenomegaly or masses, no abdominal bruits or    hernia , no rebound or guarding.   Rectal: deferred to time of colonoscopy Extremities: No lower extremity edema. No clubbing or deformities.  Neuro: Alert and oriented x 4 , grossly normal neurologically.  Skin: Warm and dry, no rash or jaundice.   Psych: Alert and cooperative, normal mood and affect.  Labs: Lab Results  Component Value Date   WBC 6.2 08/23/2011   HGB 13.3 08/23/2011   HCT 37.8 08/23/2011   MCV 84.2 08/23/2011   PLT 144* 08/23/2011   Lab Results  Component Value Date   CREATININE 0.63 08/25/2011   BUN 13 08/25/2011   NA 137 08/25/2011   K 3.4* 08/25/2011   CL 100 08/25/2011   CO2 28 08/25/2011   Lab Results  Component Value  Date   HGBA1C 10.4* 08/23/2011   Imaging Studies: No results found.

## 2012-01-01 NOTE — Assessment & Plan Note (Signed)
Chronic intermittent GERD with prn use of Nexium. Offered her EGD to rule out Barrett's but she declined. Continue antireflux measures and PPI.

## 2012-01-01 NOTE — Progress Notes (Signed)
Addended by: Cherene Julian D on: 01/01/2012 04:06 PM   Modules accepted: Orders

## 2012-01-01 NOTE — Assessment & Plan Note (Signed)
Add Miralax 17g daily. First three days, may take bid.

## 2012-01-01 NOTE — Assessment & Plan Note (Signed)
Constant. Exam benign. Doubt acute diverticulitis. Colonoscopy as planned.

## 2012-01-02 NOTE — Telephone Encounter (Signed)
Pt is scheduled for TCS on 04/18- instructions mailed

## 2012-01-04 ENCOUNTER — Other Ambulatory Visit: Payer: Self-pay | Admitting: Gastroenterology

## 2012-01-04 MED ORDER — PEG 3350-KCL-NA BICARB-NACL 420 G PO SOLR: ORAL | Status: AC

## 2012-01-09 ENCOUNTER — Encounter (HOSPITAL_COMMUNITY): Payer: Self-pay | Admitting: Pharmacy Technician

## 2012-01-10 ENCOUNTER — Encounter (HOSPITAL_COMMUNITY): Payer: Self-pay | Admitting: *Deleted

## 2012-01-10 ENCOUNTER — Ambulatory Visit: Admit: 2012-01-10 | Payer: Self-pay | Admitting: Internal Medicine

## 2012-01-10 ENCOUNTER — Encounter (HOSPITAL_COMMUNITY)
Admission: RE | Disposition: A | Discharge status: Home / Self Care | Payer: Self-pay | Source: Ambulatory Visit | Attending: Internal Medicine

## 2012-01-10 ENCOUNTER — Ambulatory Visit (HOSPITAL_COMMUNITY)
Admission: RE | Admit: 2012-01-10 | Discharge: 2012-01-10 | Disposition: A | Discharge status: Home / Self Care | Payer: Medicare Other | Source: Ambulatory Visit | Attending: Internal Medicine | Admitting: Internal Medicine

## 2012-01-10 ENCOUNTER — Other Ambulatory Visit: Payer: Self-pay | Admitting: Gastroenterology

## 2012-01-10 DIAGNOSIS — Z79899 Other long term (current) drug therapy: Secondary | ICD-10-CM | POA: Insufficient documentation

## 2012-01-10 DIAGNOSIS — R109 Unspecified abdominal pain: Secondary | ICD-10-CM

## 2012-01-10 DIAGNOSIS — I10 Essential (primary) hypertension: Secondary | ICD-10-CM | POA: Insufficient documentation

## 2012-01-10 DIAGNOSIS — K5730 Diverticulosis of large intestine without perforation or abscess without bleeding: Secondary | ICD-10-CM | POA: Insufficient documentation

## 2012-01-10 DIAGNOSIS — R198 Other specified symptoms and signs involving the digestive system and abdomen: Secondary | ICD-10-CM

## 2012-01-10 DIAGNOSIS — D126 Benign neoplasm of colon, unspecified: Secondary | ICD-10-CM | POA: Insufficient documentation

## 2012-01-10 DIAGNOSIS — Z794 Long term (current) use of insulin: Secondary | ICD-10-CM | POA: Insufficient documentation

## 2012-01-10 DIAGNOSIS — E119 Type 2 diabetes mellitus without complications: Secondary | ICD-10-CM | POA: Insufficient documentation

## 2012-01-10 DIAGNOSIS — Z01812 Encounter for preprocedural laboratory examination: Secondary | ICD-10-CM | POA: Insufficient documentation

## 2012-01-10 DIAGNOSIS — E785 Hyperlipidemia, unspecified: Secondary | ICD-10-CM | POA: Insufficient documentation

## 2012-01-10 HISTORY — PX: COLONOSCOPY: SHX5424

## 2012-01-10 SURGERY — COLONOSCOPY
Anesthesia: Moderate Sedation

## 2012-01-10 MED ORDER — SODIUM CHLORIDE 0.9 % IJ SOLN
INTRAMUSCULAR | Status: AC
  Filled 2012-01-10: qty 10

## 2012-01-10 MED ORDER — MIDAZOLAM HCL 5 MG/5ML IJ SOLN
INTRAMUSCULAR | Status: DC | PRN
  Administered 2012-01-10 (×2): 2 mg via INTRAVENOUS

## 2012-01-10 MED ORDER — PROMETHAZINE HCL 25 MG/ML IJ SOLN
25.0000 mg | Freq: Once | INTRAMUSCULAR | Status: AC
  Administered 2012-01-10: 25 mg via INTRAVENOUS

## 2012-01-10 MED ORDER — PROMETHAZINE HCL 25 MG/ML IJ SOLN
INTRAMUSCULAR | Status: AC
  Filled 2012-01-10: qty 1

## 2012-01-10 MED ORDER — MEPERIDINE HCL 100 MG/ML IJ SOLN
INTRAMUSCULAR | Status: AC
  Filled 2012-01-10: qty 1

## 2012-01-10 MED ORDER — MEPERIDINE HCL 100 MG/ML IJ SOLN
INTRAMUSCULAR | Status: DC | PRN
  Administered 2012-01-10: 50 mg via INTRAVENOUS
  Administered 2012-01-10: 25 mg via INTRAVENOUS

## 2012-01-10 MED ORDER — SODIUM CHLORIDE 0.45 % IV SOLN
Freq: Once | INTRAVENOUS | Status: AC
  Administered 2012-01-10: 14:00:00 via INTRAVENOUS

## 2012-01-10 MED ORDER — STERILE WATER FOR IRRIGATION IR SOLN
Status: DC | PRN
  Administered 2012-01-10: 14:00:00

## 2012-01-10 MED ORDER — MIDAZOLAM HCL 5 MG/5ML IJ SOLN
INTRAMUSCULAR | Status: AC
  Filled 2012-01-10: qty 10

## 2012-01-10 NOTE — Discharge Instructions (Signed)
  Colonoscopy Discharge Instructions  Read the instructions outlined below and refer to this sheet in the next few weeks. These discharge instructions provide you with general information on caring for yourself after you leave the hospital. Your doctor may also give you specific instructions. While your treatment has been planned according to the most current medical practices available, unavoidable complications occasionally occur. If you have any problems or questions after discharge, call Dr. Jena Gauss at [phone]. ACTIVITY  You may resume your regular activity, but move at a slower pace for the next 24 hours.   Take frequent rest periods for the next 24 hours.   Walking will help get rid of the air and reduce the bloated feeling in your belly (abdomen).   No driving for 24 hours (because of the medicine (anesthesia) used during the test).    Do not sign any important legal documents or operate any machinery for 24 hours (because of the anesthesia used during the test).  NUTRITION  Drink plenty of fluids.   You may resume your normal diet as instructed by your doctor.   Begin with a light meal and progress to your normal diet. Heavy or fried foods are harder to digest and may make you feel sick to your stomach (nauseated).   Avoid alcoholic beverages for 24 hours or as instructed.  MEDICATIONS  You may resume your normal medications unless your doctor tells you otherwise.  WHAT YOU CAN EXPECT TODAY  Some feelings of bloating in the abdomen.   Passage of more gas than usual.   Spotting of blood in your stool or on the toilet paper.  IF YOU HAD POLYPS REMOVED DURING THE COLONOSCOPY:  No aspirin products for 7 days or as instructed.   No alcohol for 7 days or as instructed.   Eat a soft diet for the next 24 hours.  FINDING OUT THE RESULTS OF YOUR TEST Not all test results are available during your visit. If your test results are not back during the visit, make an appointment  with your caregiver to find out the results. Do not assume everything is normal if you have not heard from your caregiver or the medical facility. It is important for you to follow up on all of your test results.  SEEK IMMEDIATE MEDICAL ATTENTION IF:  You have more than a spotting of blood in your stool.   Your belly is swollen (abdominal distention).   You are nauseated or vomiting.   You have a temperature over 101.   You have abdominal pain or discomfort that is severe or gets worse throughout the day.   Diverticulosis and polyp information provided.  CT of abdomen and pelvis with IV and oral contrast to evaluate abdominal pain and unexplained colonoscopy findings.  Further recommendations to follow pending review of pathology report.

## 2012-01-10 NOTE — H&P (View-Only) (Signed)
Primary Care Physician:  MOORE, DONALD, MD, MD  Primary Gastroenterologist:  Michael Rourk, MD   Chief Complaint  Patient presents with  . Constipation  . Gas  . Bloated    HPI:  [name] is a 61 y.o. female here to schedule colonoscopy. She states Dr. Donald Moore has been on her for years to have one done. Last attempted colonoscopy was by Dr. Anwar in 2002. She had severe diverticular changes of the sigmoid and descending colon and scattered diverticular changes throughout the rest of the colon. The tip of the scope could not be tipped into the cecal area. Patient denies followup barium enema to augment study  Heme negative per PCP recently. Complains a 6 month history of LLQ discomfort pressure all the time unrelated to meals or BMs. Rectal pressure all the time. Not relieved by BM. BM once per week, laxatives not providing relief. No melena, brbpr. Every 6 weeks or so some diarrhea and vomiting but seems to be related to high sugars. May be getting insulin pump. No heartburn on Nexium but doesn't take on a regular basis. Denies frequent heartburn on a chronic basis. May go weeks without Nexium. H/O ulcers per patient but no prior EGDs.   Current Outpatient Prescriptions  Medication Sig Dispense Refill  . ALPRAZolam (XANAX) 0.5 MG tablet Take 0.5 mg by mouth 3 (three) times daily as needed. For anxiety      . aspirin 325 MG tablet Take 325 mg by mouth daily.        . clopidogrel (PLAVIX) 75 MG tablet Take 75 mg by mouth daily.       . esomeprazole (NEXIUM) 40 MG capsule Take 40 mg by mouth daily before breakfast.        . ezetimibe (ZETIA) 10 MG tablet Take 10 mg by mouth daily.        . furosemide (LASIX) 40 MG tablet Take 40 mg by mouth 2 (two) times daily.        . gabapentin (NEURONTIN) 300 MG capsule Take 300 mg by mouth 2 (two) times daily.        . insulin detemir (LEVEMIR) 100 UNIT/ML injection Inject 70 Units into the skin 2 (two) times daily.  50 mL  0  . insulin lispro  (HUMALOG) 100 UNIT/ML injection Inject 24-26 Units into the skin as directed.       . lisinopril (PRINIVIL,ZESTRIL) 20 MG tablet Take 20 mg by mouth daily.        . nitroGLYCERIN (NITROSTAT) 0.4 MG SL tablet Place 0.4 mg under the tongue every 5 (five) minutes as needed. For chest pain      . potassium chloride SA (K-DUR,KLOR-CON) 20 MEQ tablet Take 20 mEq by mouth daily.        . rosuvastatin (CRESTOR) 40 MG tablet Take 40 mg by mouth daily.        . sitaGLIPtan-metformin (JANUMET) 50-500 MG per tablet Take 1 tablet by mouth 2 (two) times daily with a meal.       . Vitamin D, Ergocalciferol, (DRISDOL) 50000 UNITS CAPS Take 50,000 Units by mouth every 7 (seven) days. friday       . zolpidem (AMBIEN) 10 MG tablet Take 10 mg by mouth at bedtime as needed. For sleep      . isosorbide mononitrate (IMDUR) 120 MG 24 hr tablet Take 120 mg by mouth daily.       .         Allergies as   of 01/01/2012 - Review Complete 01/01/2012  Allergen Reaction Noted  . Codeine Nausea And Vomiting 08/26/2008  . Iohexol  04/16/2007  . Ticlid (ticlopidine hcl)  02/02/2011    Past Medical History  Diagnosis Date  . Hypokalemia   . Diverticulitis, colon   . MS (multiple sclerosis)     not confirmed  . Depression   . PVD (peripheral vascular disease)   . HLD (hyperlipidemia)   . HTN (hypertension)   . CAD (coronary artery disease)     Non STEMI,Taxus drug circumflex June 2008, non drug eluting stent left anterior descending in July as well as angioplasty to the  diagonal.  Diag 80% stenosis with PCI April 2011  . Chest pain   . Carotid artery plaque     Mild plaque  . IDDM (insulin dependent diabetes mellitus)   . GERD (gastroesophageal reflux disease)   . CHF (congestive heart failure)   . Prolapse of uterus   . TIA (transient ischemic attack)   . PAT (paroxysmal atrial tachycardia)   . Hemiplegic migraine   . Anxiety     Past Surgical History  Procedure Date  . Appendectomy   . Cholecystectomy     . Cardiac cath with angioplasty and stent 03/2007  . Partial hysterectomy 1986    ovaries remain - prolaspe uterus   . Breast lumpectomy     Dr. P. Young   . Colonoscopy 2002    Dr. Anwar--> Severe diverticular changes in the region of the sigmoid and descending colon with scattered diverticular changes throughout the rest of the colon. No polyps, ulcerations. Despite numerous manipulations, the tip of the scope could not be tipped into the cecal area.    Family History  Problem Relation Age of Onset  . Heart attack Mother 78  . Diabetes Father   . Heart attack Father 99  . Heart attack Brother 30    x6  . Colon cancer Paternal Aunt     50s, died with brain anuerysm  . Crohn's disease Cousin     paternal    History   Social History  . Marital Status: Married    Spouse Name: N/A    Number of Children: 4  . Years of Education: N/A   Occupational History  . Disability   .     Social History Main Topics  . Smoking status: Never Smoker   . Smokeless tobacco: Not on file  . Alcohol Use: No  . Drug Use: No  . Sexually Active: Not on file   Other Topics Concern  . Not on file   Social History Narrative  . No narrative on file    ROS:  General: Negative for anorexia, weight loss, fever, chills, fatigue, weakness. Eyes: Negative for vision changes.  ENT: Negative for hoarseness, difficulty swallowing , nasal congestion. CV: Negative for chest pain, angina, palpitations, dyspnea on exertion, peripheral edema. Patient has a history of chronic chest pain but states it has settled down for the past several months. Respiratory: Negative for dyspnea at rest, dyspnea on exertion, cough, sputum, wheezing.  GI: See history of present illness. GU:  Negative for dysuria, hematuria, urinary incontinence, urinary frequency, nocturnal urination.  MS: Negative for joint pain. Mild intermittent low back pain.  Derm: Negative for rash or itching.  Neuro: Negative for weakness,  abnormal sensation, seizure, frequent headaches, memory loss, confusion. States she was told she likely has him as but workup is inconclusive. States she had a positive MRI    But negative CSF analysis. Complains of frequent falls. Psych: Negative for anxiety, depression, suicidal ideation, hallucinations.  Endo: Negative for unusual weight change.  Heme: Negative for bruising or bleeding. Allergy: Negative for rash or hives.    Physical Examination:  BP 127/74  Pulse 66  Temp(Src) 98 F (36.7 C) (Temporal)  Ht 5' 6" (1.676 m)  Wt 183 lb 12.8 oz (83.371 kg)  BMI 29.67 kg/m2   General: Well-nourished, well-developed in no acute distress. Very talkative. Upbeat. Head: Normocephalic, atraumatic.   Eyes: Conjunctiva pink, no icterus. Mouth: Oropharyngeal mucosa moist and pink , no lesions erythema or exudate. Neck: Supple without thyromegaly, masses, or lymphadenopathy.  Lungs: Clear to auscultation bilaterally.  Heart: Regular rate and rhythm, no murmurs rubs or gallops.  Abdomen: Bowel sounds are normal, minimal left lower quadrant tenderness. She has increased density in LUQ compared to remainder of abdomen. Feels doughy and firm without discrete mass. nontender, nondistended, no hepatosplenomegaly or masses, no abdominal bruits or    hernia , no rebound or guarding.   Rectal: deferred to time of colonoscopy Extremities: No lower extremity edema. No clubbing or deformities.  Neuro: Alert and oriented x 4 , grossly normal neurologically.  Skin: Warm and dry, no rash or jaundice.   Psych: Alert and cooperative, normal mood and affect.  Labs: Lab Results  Component Value Date   WBC 6.2 08/23/2011   HGB 13.3 08/23/2011   HCT 37.8 08/23/2011   MCV 84.2 08/23/2011   PLT 144* 08/23/2011   Lab Results  Component Value Date   CREATININE 0.63 08/25/2011   BUN 13 08/25/2011   NA 137 08/25/2011   K 3.4* 08/25/2011   CL 100 08/25/2011   CO2 28 08/25/2011   Lab Results  Component Value  Date   HGBA1C 10.4* 08/23/2011   Imaging Studies: No results found.    

## 2012-01-10 NOTE — Interval H&P Note (Signed)
History and Physical Interval Note:  01/10/2012 1:45 PM  Renato Gails  has presented today for surgery, with the diagnosis of change in bowel habits  The various methods of treatment have been discussed with the patient and family. After consideration of risks, benefits and other options for treatment, the patient has consented to  Procedure(s) (LRB): COLONOSCOPY (N/A) as a surgical intervention .  The patients' history has been reviewed, patient examined, no change in status, stable for surgery.  I have reviewed the patients' chart and labs.  Questions were answered to the patient's satisfaction.     Eula Listen

## 2012-01-14 ENCOUNTER — Telehealth: Payer: Self-pay | Admitting: Gastroenterology

## 2012-01-14 DIAGNOSIS — R109 Unspecified abdominal pain: Secondary | ICD-10-CM

## 2012-01-14 NOTE — Telephone Encounter (Signed)
Received copy of creatinine from [place] - 0.73 drawn on 04/05- called Radiology, spoke with Jonce- she will add this to the order

## 2012-01-14 NOTE — Telephone Encounter (Signed)
Pt is scheduled for CT on 04/25 @ 8:45- She is aware to pick up contrast and to be NPO - pt stated Dr [name] had just did blood work and she would see if they did a creatinine, if so will have them fax to me

## 2012-01-15 ENCOUNTER — Encounter: Payer: Self-pay | Admitting: Internal Medicine

## 2012-01-16 ENCOUNTER — Telehealth: Payer: Self-pay | Admitting: Gastroenterology

## 2012-01-16 MED ORDER — PREDNISONE 50 MG PO TABS: ORAL_TABLET | ORAL | Status: DC

## 2012-01-16 MED ORDER — DIPHENHYDRAMINE HCL 50 MG PO TABS: ORAL_TABLET | ORAL | Status: DC

## 2012-01-16 NOTE — Op Note (Signed)
[name], [name]                 ACCOUNT NO.:  [id number]  MEDICAL RECORD NO.:  [id number]  LOCATION:                                 FACILITY:  PHYSICIAN:  R. Roetta Sessions, MD FACP FACGDATE OF BIRTH:  [birth year]  DATE OF PROCEDURE:  01/10/2012 DATE OF DISCHARGE:  01/10/2012                              OPERATIVE REPORT   PROCEDURES:  Colonoscopy with snare polypectomy.  INDICATIONS FOR PROCEDURE:  The patient is a 61 year old lady, somewhat overdue for screening colonoscopy.  She has had some nonspecific upper abdominal pain along the way.  Colonoscopy is now being done predominantly for colorectal cancer screening.  Risks, benefits, limitations, alternatives, and imponderables have been discussed, questions answered.  Please see the documentation in the medical record.  O2 saturation, blood pressure, pulse, respirations were monitored throughout the entirety of the procedure.  Conscious sedation with Versed 4 mg IV, Demerol 75 mg IV in divided doses.  Phenergan 25 mg IV to augment conscious sedation.  PROCEDURE NOTE:  The patient was placed in the left lateral decubitus position.  O2 saturation, blood pressure, pulse, respirations monitored throughout the entire procedure.  FINDINGS:  Digital rectal exam revealed no abnormalities.  Endoscopic findings:  Prep was suboptimal and rather poor, particularly on the right side.  Examination of the colonic mucosa from the rectosigmoid junction all the way to the cecum was undertaken.  The cecum, ileocecal valve, appendiceal orifice were well seen and photographed for the record.  From this level, the scope was slowly and cautiously withdrawn. All previously mentioned mucosal surfaces were again seen.  The patient had pancolonic diverticula, 5-mm pedunculated cecal polyp present.  It was removed with hot snare cautery and recovered through the scope. Scope was pulled down into the rectum where a thorough exam of rectal mucosa  including retroflexed view of the anal verge demonstrated no abnormalities.  The patient tolerated the procedure well and was taken to the recovery in stable condition.  IMPRESSION: 1. Normal rectum. 2. Pancolonic diverticulosis. 3. Cecal polyp, status post snare polypectomy. 4. Poor prep compromised exam on the right side.  RECOMMENDATIONS: 1. Polyp and diverticulosis literature provided to the patient. 2. Follow up on pathology. 3. Proceed with abdominal and pelvic CT to further evaluate abdominal     pain, not explained with today's colonoscopy findings.     Jonathon Bellows, MD Cale[facility] Essex     RMR/MEDQ  D:  01/15/2012  T:  01/15/2012  Job:  [id number]  cc:   Ernestina Penna, M.D. Fax: [phone]

## 2012-01-16 NOTE — Telephone Encounter (Signed)
Please call patient ASAP. She needs to be premedicates for her CT which is scheduled in the morning.   Take Prednisone 50mg  po 13, 7, 1 hour before CT. Take benadryl 50mg  po 1 hour before CT.  RX sent to pharmacy, [facility].

## 2012-01-16 NOTE — Telephone Encounter (Signed)
Pt aware.

## 2012-01-17 ENCOUNTER — Ambulatory Visit (HOSPITAL_COMMUNITY)
Admission: RE | Admit: 2012-01-17 | Discharge: 2012-01-17 | Disposition: A | Discharge status: Home / Self Care | Payer: Medicare Other | Source: Ambulatory Visit | Attending: Internal Medicine | Admitting: Internal Medicine

## 2012-01-17 ENCOUNTER — Encounter (HOSPITAL_COMMUNITY): Payer: Self-pay | Admitting: Internal Medicine

## 2012-01-17 DIAGNOSIS — K5730 Diverticulosis of large intestine without perforation or abscess without bleeding: Secondary | ICD-10-CM | POA: Insufficient documentation

## 2012-01-17 DIAGNOSIS — R109 Unspecified abdominal pain: Secondary | ICD-10-CM

## 2012-01-17 DIAGNOSIS — R1011 Right upper quadrant pain: Secondary | ICD-10-CM | POA: Insufficient documentation

## 2012-01-17 DIAGNOSIS — K5909 Other constipation: Secondary | ICD-10-CM | POA: Insufficient documentation

## 2012-01-17 DIAGNOSIS — R1031 Right lower quadrant pain: Secondary | ICD-10-CM | POA: Insufficient documentation

## 2012-01-17 MED ORDER — IOHEXOL 300 MG/ML  SOLN
100.0000 mL | Freq: Once | INTRAMUSCULAR | Status: AC | PRN
  Administered 2012-01-17: 100 mL via INTRAVENOUS

## 2012-01-23 ENCOUNTER — Encounter: Payer: Medicare Other | Admitting: Gastroenterology

## 2012-01-23 NOTE — Progress Notes (Signed)
REVIEWED.  

## 2012-03-19 LAB — HM DEXA SCAN

## 2012-04-10 ENCOUNTER — Encounter: Payer: Self-pay | Admitting: Cardiology

## 2012-04-29 DIAGNOSIS — R079 Chest pain, unspecified: Secondary | ICD-10-CM

## 2012-04-30 ENCOUNTER — Encounter: Payer: Self-pay | Admitting: Cardiology

## 2012-04-30 DIAGNOSIS — I2510 Atherosclerotic heart disease of native coronary artery without angina pectoris: Secondary | ICD-10-CM

## 2012-04-30 DIAGNOSIS — R079 Chest pain, unspecified: Secondary | ICD-10-CM

## 2012-07-10 ENCOUNTER — Encounter: Payer: Self-pay | Admitting: Cardiology

## 2012-08-27 ENCOUNTER — Ambulatory Visit: Payer: Medicare Other | Admitting: Cardiology

## 2012-09-24 HISTORY — PX: EYE SURGERY: SHX253

## 2012-10-01 ENCOUNTER — Ambulatory Visit: Payer: Medicare Other | Admitting: Cardiology

## 2012-10-29 ENCOUNTER — Encounter: Payer: Self-pay | Admitting: Cardiology

## 2012-12-10 ENCOUNTER — Ambulatory Visit: Payer: Medicare Other | Admitting: Cardiology

## 2012-12-18 ENCOUNTER — Other Ambulatory Visit: Payer: Self-pay | Admitting: *Deleted

## 2012-12-18 MED ORDER — ZOLPIDEM TARTRATE 10 MG PO TABS: ORAL_TABLET | ORAL | Status: DC

## 2012-12-18 MED ORDER — ALPRAZOLAM 0.5 MG PO TABS: 0.5000 mg | ORAL_TABLET | Freq: Three times a day (TID) | ORAL | Status: DC | PRN

## 2012-12-19 ENCOUNTER — Telehealth: Payer: Self-pay | Admitting: Family Medicine

## 2012-12-19 NOTE — Telephone Encounter (Signed)
Please advise 

## 2012-12-19 NOTE — Telephone Encounter (Signed)
Call RX in for ambien and xanax. Called pt and made aware.

## 2012-12-31 ENCOUNTER — Ambulatory Visit: Payer: Medicare Other | Admitting: Cardiology

## 2013-01-21 ENCOUNTER — Ambulatory Visit: Payer: Medicare Other | Admitting: Cardiology

## 2013-02-05 ENCOUNTER — Ambulatory Visit (INDEPENDENT_AMBULATORY_CARE_PROVIDER_SITE_OTHER): Payer: Medicare Other | Admitting: Family Medicine

## 2013-02-05 ENCOUNTER — Encounter: Payer: Self-pay | Admitting: Family Medicine

## 2013-02-05 ENCOUNTER — Telehealth: Payer: Self-pay

## 2013-02-05 VITALS — BP 129/77 | HR 77 | Temp 97.1°F | Ht 65.5 in | Wt 187.0 lb

## 2013-02-05 DIAGNOSIS — I10 Essential (primary) hypertension: Secondary | ICD-10-CM

## 2013-02-05 DIAGNOSIS — R5383 Other fatigue: Secondary | ICD-10-CM

## 2013-02-05 DIAGNOSIS — E559 Vitamin D deficiency, unspecified: Secondary | ICD-10-CM

## 2013-02-05 DIAGNOSIS — R079 Chest pain, unspecified: Secondary | ICD-10-CM

## 2013-02-05 DIAGNOSIS — I509 Heart failure, unspecified: Secondary | ICD-10-CM

## 2013-02-05 DIAGNOSIS — R5381 Other malaise: Secondary | ICD-10-CM

## 2013-02-05 DIAGNOSIS — E119 Type 2 diabetes mellitus without complications: Secondary | ICD-10-CM

## 2013-02-05 DIAGNOSIS — E785 Hyperlipidemia, unspecified: Secondary | ICD-10-CM

## 2013-02-05 LAB — HEPATIC FUNCTION PANEL
Albumin: 4.3 g/dL (ref 3.5–5.2)
Alkaline Phosphatase: 53 U/L (ref 39–117)
Indirect Bilirubin: 0.5 mg/dL (ref 0.0–0.9)
Total Bilirubin: 0.6 mg/dL (ref 0.3–1.2)

## 2013-02-05 LAB — POCT GLYCOSYLATED HEMOGLOBIN (HGB A1C): Hemoglobin A1C: 10.7

## 2013-02-05 LAB — THYROID PANEL WITH TSH: T4, Total: 8.3 ug/dL (ref 5.0–12.5)

## 2013-02-05 LAB — POCT CBC
Granulocyte percent: 65.6 %G (ref 37–80)
HCT, POC: 45.1 % (ref 37.7–47.9)
Hemoglobin: 15 g/dL (ref 12.2–16.2)
POC Granulocyte: 5.8 (ref 2–6.9)
RBC: 5.1 M/uL (ref 4.04–5.48)

## 2013-02-05 LAB — BASIC METABOLIC PANEL WITH GFR
GFR, Est African American: 72 mL/min
GFR, Est Non African American: 63 mL/min
Glucose, Bld: 222 mg/dL — ABNORMAL HIGH (ref 70–99)
Potassium: 4.4 mEq/L (ref 3.5–5.3)
Sodium: 137 mEq/L (ref 135–145)

## 2013-02-05 LAB — GLUCOSE, POCT (MANUAL RESULT ENTRY): POC Glucose: 204 mg/dl — AB (ref 70–99)

## 2013-02-05 MED ORDER — FUROSEMIDE 40 MG PO TABS: 40.0000 mg | ORAL_TABLET | Freq: Two times a day (BID) | ORAL | Status: DC

## 2013-02-05 MED ORDER — VITAMIN D (ERGOCALCIFEROL) 1.25 MG (50000 UNIT) PO CAPS: 50000.0000 [IU] | ORAL_CAPSULE | ORAL | Status: DC

## 2013-02-05 MED ORDER — ALPRAZOLAM 0.5 MG PO TABS: 0.5000 mg | ORAL_TABLET | Freq: Three times a day (TID) | ORAL | Status: DC | PRN

## 2013-02-05 MED ORDER — POTASSIUM CHLORIDE CRYS ER 20 MEQ PO TBCR: 20.0000 meq | EXTENDED_RELEASE_TABLET | Freq: Every day | ORAL | Status: DC

## 2013-02-05 MED ORDER — ESOMEPRAZOLE MAGNESIUM 40 MG PO CPDR: 40.0000 mg | DELAYED_RELEASE_CAPSULE | Freq: Every day | ORAL | Status: DC | PRN

## 2013-02-05 MED ORDER — ROSUVASTATIN CALCIUM 40 MG PO TABS: 40.0000 mg | ORAL_TABLET | Freq: Every day | ORAL | Status: DC

## 2013-02-05 MED ORDER — LISINOPRIL 20 MG PO TABS: 20.0000 mg | ORAL_TABLET | Freq: Two times a day (BID) | ORAL | Status: DC

## 2013-02-05 MED ORDER — INSULIN LISPRO 100 UNIT/ML ~~LOC~~ SOLN: 18.0000 [IU] | SUBCUTANEOUS | Status: DC

## 2013-02-05 MED ORDER — ZOLPIDEM TARTRATE 10 MG PO TABS: ORAL_TABLET | ORAL | Status: DC

## 2013-02-05 MED ORDER — EZETIMIBE 10 MG PO TABS: 10.0000 mg | ORAL_TABLET | Freq: Every day | ORAL | Status: DC

## 2013-02-05 MED ORDER — NITROGLYCERIN 0.4 MG SL SUBL: 0.4000 mg | SUBLINGUAL_TABLET | SUBLINGUAL | Status: DC | PRN

## 2013-02-05 MED ORDER — GABAPENTIN 300 MG PO CAPS: 300.0000 mg | ORAL_CAPSULE | Freq: Every day | ORAL | Status: DC | PRN

## 2013-02-05 MED ORDER — ATENOLOL 50 MG PO TABS: 75.0000 mg | ORAL_TABLET | Freq: Two times a day (BID) | ORAL | Status: DC

## 2013-02-05 MED ORDER — ISOSORBIDE MONONITRATE ER 120 MG PO TB24: 120.0000 mg | ORAL_TABLET | Freq: Every day | ORAL | Status: DC

## 2013-02-05 NOTE — Telephone Encounter (Signed)
Just get her to see one of the other cardiologists

## 2013-02-05 NOTE — Patient Instructions (Addendum)
Continue current medications and aggressive therapeutic lifestyle changes We will make an appointment with a cardiologist for followup We will also arrange a visit with the clinical pharmacist to review your diabetic management Always be careful don't climb, don't fall

## 2013-02-05 NOTE — Progress Notes (Signed)
Subjective:    Patient ID: [name], female    DOB: March 14, 1951, 62 y.o.   MRN: [id number]  HPI This patient presents for recheck of multiple medical problems. No one accompanies the patient today.  Patient Active Problem List   Diagnosis Date Noted  . Diverticulosis of colon 01/01/2012  . Left lower quadrant pain 01/01/2012  . Rectal pain 01/01/2012  . GERD (gastroesophageal reflux disease) 01/01/2012  . Change in bowel function 01/01/2012  . Constipation 01/01/2012  . Abnormal abdominal exam 01/01/2012  . Hypokalemia 08/24/2011  . Diabetes type 2, uncontrolled 08/23/2011  . Chest pain 08/23/2011  . HTN (hypertension) 08/23/2011  . PALPITATIONS 02/10/2010  . HYPERLIPIDEMIA 05/10/2009  . DEPRESSION 05/10/2009  . MULTIPLE SCLEROSIS 05/10/2009  . CAD 05/10/2009  . PVD 05/10/2009  . DIVERTICULITIS, COLON 05/10/2009  . MYOCARDIAL INFARCTION, SUBENDOCARDIAL, SUBSEQUENT CARE 08/26/2008  . CAD, NATIVE VESSEL 08/26/2008  . 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 08/26/2008    In addition, see review of systems. Patient is dealing with a lot of stress related to her husband's illness.  The allergies, current medications, past medical history, surgical history, family and social history are reviewed.  Immunizations reviewed.  Health maintenance reviewed.  The following items are outstanding: The cost of Zostavax will be checked. Pap will be scheduled with Gennette Pac. An FOBT will be given.      Review of Systems  Constitutional: Positive for appetite change (desreased) and fatigue.  Eyes: Positive for photophobia.  Respiratory: Positive for shortness of breath (with exertion). Negative for cough and wheezing.   Cardiovascular: Positive for palpitations (daily) and leg swelling (daily).  Gastrointestinal: Positive for abdominal pain (R mid quadrant, occasional).  Endocrine: Negative.   Genitourinary: Negative.   Musculoskeletal: Positive for myalgias (legs) and back pain (LBP).   Skin: Negative.   Allergic/Immunologic: Positive for environmental allergies (seasonal).  Neurological: Positive for headaches.  Psychiatric/Behavioral: Positive for sleep disturbance (nightly). The patient is nervous/anxious (due to husband's illness).    Patient continues to have some chest discomfort after exertion at home requiring her to rest after that exertion. She still has palpitations. No PND, she does sleep on 3-4 pillows. Blood sugar still vary a lot and sometimes very high. Fasting blood sugar this morning was 167, last night her blood sugar was 500. Labs will be drawn today    Objective:   Physical Exam BP 129/77  Pulse 77  Temp(Src) 97.1 F (36.2 C) (Oral)  Ht 5' 5.5" (1.664 m)  Wt 187 lb (84.823 kg)  BMI 30.63 kg/m2  The patient appeared well nourished and normally developed, alert and oriented to time and place. Speech, behavior and judgement appear normal. Vital signs as documented.  Head exam is unremarkable. No scleral icterus or pallor noted.  Neck is without jugular venous distension, thyromegally, or carotid bruits. Carotid upstrokes are brisk bilaterally. No cervical adenopathy. Lungs are clear anteriorly and posteriorly to auscultation. Normal respiratory effort. Cardiac exam reveals regular rate and rhythm at 72 per minute. First and second heart sounds normal.  No murmurs, rubs or gallops.  Abdominal exam reveals normal bowl sounds, no masses, no organomegaly and no aortic enlargement. No inguinal adenopathy. Extremities are nonedematous.Both femoral and pedal pulses are somewhat diminished right greater than left. Skin without pallor or jaundice.  Warm and dry, without rash. Neurologic exam reveals normal deep tendon reflexes and normal sensation. Diabetic foot exam was done today.  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 Diabetes - POCT glycosylated hemoglobin (Hb A1C); Standing - POCT glycosylated hemoglobin (Hb  A1C) - POCT glucose (manual entry)  2. Hypertension - BASIC METABOLIC PANEL WITH GFR; Standing - BASIC METABOLIC PANEL WITH GFR  3. Hyperlipemia - Hepatic function panel; Standing - NMR Lipoprofile with Lipids; Standing - Hepatic function panel - NMR Lipoprofile with Lipids  4. CHF (congestive heart failure) - BASIC METABOLIC PANEL WITH GFR; Standing - BASIC METABOLIC PANEL WITH GFR  5. Fatigue - POCT CBC; Standing - Thyroid Panel With TSH - POCT CBC  6. Vitamin D deficiency - Vitamin D 25 hydroxy; Standing - Vitamin D 25 hydroxy  7. Chest pain - EKG 12-Lead   Patient Instructions  Continue current medications and aggressive therapeutic lifestyle changes We will make an appointment with a cardiologist for followup We will also arrange a visit with the clinical pharmacist to review your diabetic management Always be careful don't climb, don't fall

## 2013-02-05 NOTE — Telephone Encounter (Signed)
We referred her to see Dr Johney Frame because she doesn't want to see Dr Durene Fruits   Dr. Johney Frame only sees patients for pacemakers or defibulators

## 2013-02-06 ENCOUNTER — Encounter: Payer: Self-pay | Admitting: Internal Medicine

## 2013-02-06 LAB — VITAMIN D 25 HYDROXY (VIT D DEFICIENCY, FRACTURES): Vit D, 25-Hydroxy: 32 ng/mL (ref 30–89)

## 2013-02-09 ENCOUNTER — Telehealth: Payer: Self-pay | Admitting: *Deleted

## 2013-02-09 LAB — NMR LIPOPROFILE WITH LIPIDS
HDL Particle Number: 30.4 umol/L — ABNORMAL LOW (ref >=30.5)
HDL Size: 10.1 nm (ref >=9.2)
HDL-C: 35 mg/dL — ABNORMAL LOW (ref >=40)
LDL (calc): 43 mg/dL (ref <100)
LP-IR Score: 36 (ref <=45)
Large HDL-P: 1.9 umol/L — ABNORMAL LOW (ref >=4.8)
Triglycerides: 143 mg/dL (ref <150)

## 2013-02-09 NOTE — Telephone Encounter (Signed)
Pt notified of lab results

## 2013-02-09 NOTE — Telephone Encounter (Signed)
Message copied by Bearl Mulberry on Mon Feb 09, 2013  6:43 PM ------      Message from: Ernestina Penna      Created: Mon Feb 09, 2013  5:08 PM       Thyroid within normal limits.      All liver function tests within normal limits      The total LDL P. was at goal on the advanced lipid panel, triglycerides were slightly elevated at 143, HDL particle number was slightly decreased. The small LDL P. was at goal------- continue current treatment and aggressive therapeutic lifestyle change      Vitamin D 3 was at the low end of the normal range-----increase D. 3 x 1000 daily      The electrolytes and a creatinine which is a kidney function tests were all good, blood sugar was elevated at 222-----this is way too high!      Please keep appointment with the clinical pharmacist to achieve better blood sugar control.      Must do better with therapeutic lifestyle changes       ------

## 2013-02-26 ENCOUNTER — Telehealth: Payer: Self-pay | Admitting: Family Medicine

## 2013-02-27 NOTE — Telephone Encounter (Signed)
Spoke with spouse and he said when she came home yest did not have any rx Advise to call to see if rx's at the drug store and call prn

## 2013-03-02 ENCOUNTER — Ambulatory Visit: Payer: Medicare Other

## 2013-03-06 ENCOUNTER — Ambulatory Visit: Payer: Medicare Other | Admitting: Internal Medicine

## 2013-03-16 ENCOUNTER — Telehealth: Payer: Self-pay | Admitting: Pharmacist

## 2013-03-16 NOTE — Telephone Encounter (Signed)
#  28 samples of Zetia  #35 samples of Crestor 20mg  (take 2 tablets) #3 vials Humalog All that we had available.  Spoke with patient and she has contacted Sonja at health department to see if she can help as she qualified for assistance last year when she reached the coverage gap.

## 2013-03-18 ENCOUNTER — Other Ambulatory Visit: Payer: Self-pay | Admitting: Pharmacist

## 2013-03-18 MED ORDER — INSULIN ASPART 100 UNIT/ML FLEXPEN: SUBCUTANEOUS | Status: DC

## 2013-03-18 NOTE — Telephone Encounter (Signed)
Need rx for [employer] for [employer] instead of Humalog.  They are unable to get Humalog through the indigent program.

## 2013-04-20 ENCOUNTER — Other Ambulatory Visit: Payer: Self-pay

## 2013-04-20 MED ORDER — CITALOPRAM HYDROBROMIDE 20 MG PO TABS: 20.0000 mg | ORAL_TABLET | Freq: Every day | ORAL | Status: DC

## 2013-04-20 NOTE — Telephone Encounter (Signed)
Please add this medication to her list on her record

## 2013-04-20 NOTE — Telephone Encounter (Signed)
Sent electronically to pharmacy

## 2013-04-20 NOTE — Telephone Encounter (Signed)
This medicine not on list in EPIC and patient was seen 02/05/13

## 2013-05-14 ENCOUNTER — Telehealth: Payer: Self-pay | Admitting: Cardiology

## 2013-05-14 NOTE — Telephone Encounter (Signed)
New Problem   [name] from [place] [facility] is in need of a Ejection Fraction percentage for the heart failure program.. She states that her voicemail is secure so if you need to leave the info on the voicemail that would be fine.

## 2013-05-14 NOTE — Telephone Encounter (Signed)
Last EF was 65% 04/30/12  Pt has not been seen since 2012 that I can tell - has cancelled multiple appt.

## 2013-05-15 NOTE — Telephone Encounter (Signed)
Left message of EF and requested she call back if further questions

## 2013-05-28 ENCOUNTER — Ambulatory Visit (INDEPENDENT_AMBULATORY_CARE_PROVIDER_SITE_OTHER): Payer: Medicare Other | Admitting: Pharmacist

## 2013-05-28 VITALS — BP 150/70 | HR 78 | Ht 65.5 in | Wt 184.5 lb

## 2013-05-28 DIAGNOSIS — E1165 Type 2 diabetes mellitus with hyperglycemia: Secondary | ICD-10-CM

## 2013-05-28 DIAGNOSIS — E119 Type 2 diabetes mellitus without complications: Secondary | ICD-10-CM

## 2013-05-28 LAB — POCT GLYCOSYLATED HEMOGLOBIN (HGB A1C): Hemoglobin A1C: 10.4

## 2013-05-28 MED ORDER — METFORMIN HCL ER 500 MG PO TB24: 1000.0000 mg | ORAL_TABLET | Freq: Every day | ORAL | Status: DC

## 2013-05-28 NOTE — Progress Notes (Signed)
Diabetes Follow-Up Visit Chief Complaint:   Chief Complaint  Patient presents with  . Diabetes     Filed Vitals:   05/28/13 1426  BP: 150/70  Pulse: 78   Filed Weights   05/28/13 1426  Weight: 184 lb 8 oz (83.689 kg)     HPI: patient has had an insulin pump for about 1 year but she has not been in for adjustment in several months due to dealing with her husband's poor health.  Her current pump setting are:  Basal Rate:    0.200 units/hr   CHO ratio - 7.5  Insulin sensitivity - 12  BG goal 120   Exam Edema:  Trace  Polyuria:  positive  Polydipsia:  negative Polyphagia:  negative  BMI:  Body mass index is 30.22 kg/(m^2).   Weight changes:  stable General Appearance:  alert, oriented, no acute distress and obese Mood/Affect:  normal   Low fat/carbohydrate diet?  No Nicotine Abuse?  No Medication Compliance?  Yes Exercise?  No Alcohol Abuse?  No  Home BG Monitoring:  Checking 5 times a day. Average:  305  High: over 400  Low:  70 (only occurred once)    Lab Results  Component Value Date   HGBA1C 10.4% 05/28/2013    No results found for this basename: MICROALBUR, [id number]        Assessment: 1.  Diabetes.  uncontrolled 2.  Blood Pressure.  good  Recommendations: 1.  Medication recommendations at this time are as follows:    Increase basal rate to 0.7 units/hr 2.  Reviewed HBG goals:  Fasting 80-130 and 1-2 hour post prandial <180.  Patient is instructed to check BG 5 times per day.    3.  BP goal < 140/80. 4.  LDL goal of < 100, HDL > 40 and TG < 150. 5.  Eye Exam yearly and Dental Exam every 6 months. 6.  Dietary recommendations:  Limit serving sizes of high CHO foods 7.  Physical Activity recommendations:  Aim to move more - try 10 minutes daily to start 8.  Return to clinic in 2 wks with Dr [name] and 4 weeks with [facility]   Time spent counseling patient:  40 minutes

## 2013-06-11 ENCOUNTER — Encounter: Payer: Self-pay | Admitting: Family Medicine

## 2013-06-11 ENCOUNTER — Ambulatory Visit (INDEPENDENT_AMBULATORY_CARE_PROVIDER_SITE_OTHER): Payer: Medicare Other | Admitting: Family Medicine

## 2013-06-11 VITALS — BP 152/80 | HR 79 | Temp 97.2°F | Ht 65.5 in | Wt 184.0 lb

## 2013-06-11 DIAGNOSIS — IMO0001 Reserved for inherently not codable concepts without codable children: Secondary | ICD-10-CM

## 2013-06-11 DIAGNOSIS — R0989 Other specified symptoms and signs involving the circulatory and respiratory systems: Secondary | ICD-10-CM

## 2013-06-11 DIAGNOSIS — I2510 Atherosclerotic heart disease of native coronary artery without angina pectoris: Secondary | ICD-10-CM

## 2013-06-11 DIAGNOSIS — I10 Essential (primary) hypertension: Secondary | ICD-10-CM

## 2013-06-11 DIAGNOSIS — IMO0002 Reserved for concepts with insufficient information to code with codable children: Secondary | ICD-10-CM

## 2013-06-11 DIAGNOSIS — Z794 Long term (current) use of insulin: Secondary | ICD-10-CM

## 2013-06-11 DIAGNOSIS — E785 Hyperlipidemia, unspecified: Secondary | ICD-10-CM

## 2013-06-11 DIAGNOSIS — E1165 Type 2 diabetes mellitus with hyperglycemia: Secondary | ICD-10-CM

## 2013-06-11 LAB — POCT CBC
Granulocyte percent: 61.6 %G (ref 37–80)
HCT, POC: 42.2 % (ref 37.7–47.9)
Hemoglobin: 14.2 g/dL (ref 12.2–16.2)
MCV: 87.7 fL (ref 80–97)
POC LYMPH PERCENT: 34 %L (ref 10–50)
RBC: 4.8 M/uL (ref 4.04–5.48)

## 2013-06-11 LAB — POCT GLYCOSYLATED HEMOGLOBIN (HGB A1C): Hemoglobin A1C: 9.2

## 2013-06-11 MED ORDER — METFORMIN HCL ER 500 MG PO TB24: 1000.0000 mg | ORAL_TABLET | Freq: Every day | ORAL | Status: DC

## 2013-06-11 MED ORDER — FUROSEMIDE 40 MG PO TABS: 40.0000 mg | ORAL_TABLET | Freq: Two times a day (BID) | ORAL | Status: DC

## 2013-06-11 MED ORDER — LISINOPRIL 20 MG PO TABS: 20.0000 mg | ORAL_TABLET | Freq: Two times a day (BID) | ORAL | Status: DC

## 2013-06-11 MED ORDER — ROSUVASTATIN CALCIUM 40 MG PO TABS: 40.0000 mg | ORAL_TABLET | Freq: Every day | ORAL | Status: DC

## 2013-06-11 MED ORDER — EZETIMIBE 10 MG PO TABS: 10.0000 mg | ORAL_TABLET | Freq: Every day | ORAL | Status: DC

## 2013-06-11 MED ORDER — NITROGLYCERIN 0.4 MG SL SUBL: 0.4000 mg | SUBLINGUAL_TABLET | SUBLINGUAL | Status: DC | PRN

## 2013-06-11 MED ORDER — INSULIN LISPRO 100 UNIT/ML ~~LOC~~ SOLN: SUBCUTANEOUS | Status: DC

## 2013-06-11 MED ORDER — ALPRAZOLAM 0.5 MG PO TABS: 0.5000 mg | ORAL_TABLET | Freq: Three times a day (TID) | ORAL | Status: DC | PRN

## 2013-06-11 MED ORDER — VITAMIN D (ERGOCALCIFEROL) 1.25 MG (50000 UNIT) PO CAPS: 50000.0000 [IU] | ORAL_CAPSULE | ORAL | Status: DC

## 2013-06-11 MED ORDER — GABAPENTIN 300 MG PO CAPS: 300.0000 mg | ORAL_CAPSULE | Freq: Every day | ORAL | Status: DC | PRN

## 2013-06-11 MED ORDER — ISOSORBIDE MONONITRATE ER 120 MG PO TB24: 120.0000 mg | ORAL_TABLET | Freq: Every day | ORAL | Status: DC

## 2013-06-11 MED ORDER — ZOLPIDEM TARTRATE 10 MG PO TABS: ORAL_TABLET | ORAL | Status: DC

## 2013-06-11 MED ORDER — ATENOLOL 50 MG PO TABS: 50.0000 mg | ORAL_TABLET | Freq: Two times a day (BID) | ORAL | Status: DC

## 2013-06-11 MED ORDER — ESOMEPRAZOLE MAGNESIUM 40 MG PO CPDR: 40.0000 mg | DELAYED_RELEASE_CAPSULE | Freq: Every day | ORAL | Status: DC | PRN

## 2013-06-11 MED ORDER — CLOPIDOGREL BISULFATE 75 MG PO TABS: 75.0000 mg | ORAL_TABLET | Freq: Every day | ORAL | Status: DC

## 2013-06-11 MED ORDER — POTASSIUM CHLORIDE CRYS ER 20 MEQ PO TBCR: 20.0000 meq | EXTENDED_RELEASE_TABLET | Freq: Every day | ORAL | Status: DC

## 2013-06-11 NOTE — Addendum Note (Signed)
Addended by: Tommas Olp on: 06/11/2013 09:31 AM   Modules accepted: Orders

## 2013-06-11 NOTE — Patient Instructions (Addendum)
Continue all meds Continue Lifestyle modification- diet and exercise Fall precautions discussed Keep follow up appointments with any specialists

## 2013-06-11 NOTE — Progress Notes (Signed)
Subjective:    Patient ID: [name], female    DOB: [birth year], 62 y.o.   MRN: [id number]  HPI Pt here today for follow up of chronic medical problems. Pt has no complaints today.  Specialty MD's: Cardiology - Dr.  [facility]- Dr. Kendell Bane  BP 152/80  Pulse 79  Temp(Src) 97.2 F (36.2 C) (Oral)  Ht 5' 5.5" (1.664 m)  Wt 184 lb (83.462 kg)  BMI 30.14 kg/m2   Review of Systems  Constitutional: Negative.   HENT: Negative.   Eyes: Negative.   Respiratory: Negative.   Cardiovascular: Positive for leg swelling (worse last few weeks).  Gastrointestinal: Negative.   Endocrine: Negative.   Genitourinary: Negative.   Musculoskeletal: Positive for myalgias (cramps in legs).  Skin: Negative.   Allergic/Immunologic: Negative.   Neurological: Negative.   Hematological: Negative.   Psychiatric/Behavioral: Negative.        Objective:   Physical Exam  Constitutional: She is oriented to person, place, and time. She appears well-developed and well-nourished.  HENT:  Right Ear: External ear normal.  Left Ear: External ear normal.  Nose: Mucosal edema present.  Mouth/Throat: Oropharynx is clear and moist.  Eyes: Conjunctivae and EOM are normal. Pupils are equal, round, and reactive to light.  Neck: Normal range of motion. Neck supple. No JVD present.  Cardiovascular: Normal rate, regular rhythm and normal heart sounds.   84 per/min Diminished pedal pulses  Pulmonary/Chest: Effort normal and breath sounds normal.  Abdominal: Soft. Bowel sounds are normal.  Musculoskeletal: Normal range of motion.  Lymphadenopathy:    She has no cervical adenopathy.  Neurological: She is alert and oriented to person, place, and time. She has normal reflexes. No cranial nerve deficit.  Skin: Skin is warm and dry.  Psychiatric: She has a normal mood and affect. Her behavior is normal. Judgment and thought content normal.   BP 152/80  Pulse 79  Temp(Src) 97.2 F (36.2 C) (Oral)  Ht 5' 5.5"  (1.664 m)  Wt 184 lb (83.462 kg)  BMI 30.14 kg/m2        Assessment & Plan:   1. IDDM (insulin dependent diabetes mellitus)   2. HTN (hypertension)   3. Hyperlipidemia   4. CAD   5. Diminished pulses in lower extremity    Orders Placed This Encounter  Procedures  . Hepatic function panel    Standing Status: Future     Number of Occurrences:      Standing Expiration Date: 06/11/2014  . BMP8+EGFR    Standing Status: Future     Number of Occurrences:      Standing Expiration Date: 06/11/2014  . NMR, lipoprofile    Standing Status: Future     Number of Occurrences:      Standing Expiration Date: 06/11/2014  . POCT CBC    Standing Status: Future     Number of Occurrences:      Standing Expiration Date: 06/11/2014  . POCT UA - Microalbumin  . Lower Extremity Arterial Doppler Bilateral    Standing Status: Future     Number of Occurrences:      Standing Expiration Date: 06/11/2014    Order Specific Question:  Laterality    Answer:  Bilateral    Order Specific Question:  Where should this test be performed:    Answer:  [facility] [place]   Meds ordered this encounter  Medications  . DISCONTD: insulin lispro (HUMALOG) 100 UNIT/ML injection    Sig: Inject into the skin 3 (three) times  daily between meals as needed for high blood sugar. PER PUMP  . DISCONTD: atenolol (TENORMIN) 50 MG tablet    Sig: Take 50 mg by mouth 2 (two) times daily.  Marland Kitchen zolpidem (AMBIEN) 10 MG tablet    Sig: For sleep,1/2 to 1 prn at bedtime    Dispense:  90 tablet    Refill:  1  . Vitamin D, Ergocalciferol, (DRISDOL) 50000 UNITS CAPS capsule    Sig: Take 1 capsule (50,000 Units total) by mouth every 7 (seven) days. Friday    Dispense:  12 capsule    Refill:  4  . rosuvastatin (CRESTOR) 40 MG tablet    Sig: Take 1 tablet (40 mg total) by mouth daily.    Dispense:  90 tablet    Refill:  4  . potassium chloride SA (K-DUR,KLOR-CON) 20 MEQ tablet    Sig: Take 1 tablet (20 mEq total) by mouth  daily.    Dispense:  90 tablet    Refill:  4  . nitroGLYCERIN (NITROSTAT) 0.4 MG SL tablet    Sig: Place 1 tablet (0.4 mg total) under the tongue every 5 (five) minutes as needed. For chest pain    Dispense:  30 tablet    Refill:  11  . metFORMIN (GLUCOPHAGE XR) 500 MG 24 hr tablet    Sig: Take 2 tablets (1,000 mg total) by mouth daily with breakfast.    Dispense:  60 tablet    Refill:  3  . lisinopril (PRINIVIL,ZESTRIL) 20 MG tablet    Sig: Take 1 tablet (20 mg total) by mouth 2 (two) times daily.    Dispense:  90 tablet    Refill:  4  . isosorbide mononitrate (IMDUR) 120 MG 24 hr tablet    Sig: Take 1 tablet (120 mg total) by mouth daily.    Dispense:  30 tablet    Refill:  11  . insulin lispro (HUMALOG) 100 UNIT/ML injection    Sig: PER PUMP    Dispense:  10 mL    Refill:  5  . gabapentin (NEURONTIN) 300 MG capsule    Sig: Take 1 capsule (300 mg total) by mouth daily as needed. Leg Pain    Dispense:  90 capsule    Refill:  4  . furosemide (LASIX) 40 MG tablet    Sig: Take 1 tablet (40 mg total) by mouth 2 (two) times daily.    Dispense:  180 tablet    Refill:  4  . ezetimibe (ZETIA) 10 MG tablet    Sig: Take 1 tablet (10 mg total) by mouth daily.    Dispense:  90 tablet    Refill:  4  . esomeprazole (NEXIUM) 40 MG capsule    Sig: Take 1 capsule (40 mg total) by mouth daily as needed. Indigestion    Dispense:  90 capsule    Refill:  4  . clopidogrel (PLAVIX) 75 MG tablet    Sig: Take 1 tablet (75 mg total) by mouth daily.    Dispense:  90 tablet    Refill:  3  . atenolol (TENORMIN) 50 MG tablet    Sig: Take 1 tablet (50 mg total) by mouth 2 (two) times daily.    Dispense:  180 tablet    Refill:  3  . ALPRAZolam (XANAX) 0.5 MG tablet    Sig: Take 1 tablet (0.5 mg total) by mouth 3 (three) times daily as needed. For anxiety    Dispense:  90 tablet  Refill:  2    Continue all meds Continue Lifestyle modification- diet and exercise Fall precautions  discussed Keep follow up appointments with any specialists Nyra Capes MD

## 2013-06-11 NOTE — Addendum Note (Signed)
Addended by: Tommas Olp on: 06/11/2013 09:29 AM   Modules accepted: Orders

## 2013-06-13 LAB — HEPATIC FUNCTION PANEL
AST: 18 IU/L (ref 0–40)
Albumin: 4.6 g/dL (ref 3.6–4.8)
Alkaline Phosphatase: 53 IU/L (ref 39–117)
Bilirubin, Direct: 0.13 mg/dL (ref 0.00–0.40)
Total Bilirubin: 0.4 mg/dL (ref 0.0–1.2)
Total Protein: 7.2 g/dL (ref 6.0–8.5)

## 2013-06-13 LAB — BMP8+EGFR
BUN/Creatinine Ratio: 8 — ABNORMAL LOW (ref 11–26)
CO2: 28 mmol/L (ref 18–29)
Chloride: 94 mmol/L — ABNORMAL LOW (ref 97–108)
GFR calc Af Amer: 85 mL/min/{1.73_m2} (ref >59)
Sodium: 140 mmol/L (ref 134–144)

## 2013-06-13 LAB — NMR, LIPOPROFILE
Cholesterol: 122 mg/dL (ref <200)
HDL Particle Number: 38.5 umol/L (ref >=30.5)
LP-IR Score: <25 (ref <=45)
Small LDL Particle Number: <90 nmol/L (ref <=527)

## 2013-06-18 ENCOUNTER — Encounter: Payer: Self-pay | Admitting: Cardiology

## 2013-06-18 ENCOUNTER — Ambulatory Visit (INDEPENDENT_AMBULATORY_CARE_PROVIDER_SITE_OTHER): Payer: Medicare Other | Admitting: Cardiology

## 2013-06-18 ENCOUNTER — Ambulatory Visit: Payer: Medicare Other | Admitting: Cardiology

## 2013-06-18 ENCOUNTER — Encounter: Payer: Self-pay | Admitting: *Deleted

## 2013-06-18 VITALS — BP 133/82 | HR 87 | Ht 65.0 in | Wt 179.4 lb

## 2013-06-18 DIAGNOSIS — E785 Hyperlipidemia, unspecified: Secondary | ICD-10-CM

## 2013-06-18 DIAGNOSIS — I779 Disorder of arteries and arterioles, unspecified: Secondary | ICD-10-CM

## 2013-06-18 DIAGNOSIS — I2510 Atherosclerotic heart disease of native coronary artery without angina pectoris: Secondary | ICD-10-CM

## 2013-06-18 DIAGNOSIS — I739 Peripheral vascular disease, unspecified: Secondary | ICD-10-CM

## 2013-06-18 DIAGNOSIS — R079 Chest pain, unspecified: Secondary | ICD-10-CM

## 2013-06-18 NOTE — Progress Notes (Signed)
Clinical Summary [name] is a 62 y.o.female referred to the office by Dr. [name]. She is a patient of Dr. Antoine Poche, last seen in 2012. History is reviewed below. This is our first meeting. She is referred back reporting more frequent episodes of chest pain, some typical, others atypical. She has been using nitroglycerin more frequently. This is noted in the setting of significant stress, her husband is struggling with cancer and poor health.  Echocardiogram from August 2013 revealed mild LVH with LVEF 65%, no wall motion abnormalities, mild RV enlargement with normal contraction, mild tricuspid regurgitation. Lexiscan Cardiolite in August 2013 demonstrated no diagnostic ST segment changes, lateral defect felt to be possible scar versus attenuation, no active ischemia, LVEF 73%.  Recent lab work shows normal LFTs, LDL 48, HDL 52, triglycerides 108, cholesterol 122. She reports compliance with statin therapy.  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   In addition she reports leg pain suggestive of claudication. Also blood sugars have been poorly controlled by her report. She is having adjustments in her medications and her insulin pump.   Allergies  Allergen Reactions  . Codeine Nausea And Vomiting  . Iohexol      Desc: pt had syncopal episode with nausea post IV CM late 1990's,  pt has had prednisone prep with heart caths x 2 without problem  kdean 04/16/07, Onset Date: [id number]   . Ticlid [Ticlopidine Hcl] Nausea And Vomiting    Current Outpatient Prescriptions  Medication Sig Dispense Refill  . ALPRAZolam (XANAX) 0.5 MG tablet Take 1 tablet (0.5 mg total) by mouth 3 (three) times daily as needed. For anxiety  90 tablet  2  . aspirin 325 MG tablet Take 325 mg by mouth daily.        Marland Kitchen atenolol (TENORMIN) 50 MG tablet Take 1 tablet (50 mg total) by mouth 2 (two) times daily.  180 tablet  3  . citalopram (CELEXA) 20 MG tablet  Take 1 tablet (20 mg total) by mouth daily.  30 tablet  3  . clopidogrel (PLAVIX) 75 MG tablet Take 1 tablet (75 mg total) by mouth daily.  90 tablet  3  . esomeprazole (NEXIUM) 40 MG capsule Take 1 capsule (40 mg total) by mouth daily as needed. Indigestion  90 capsule  4  . ezetimibe (ZETIA) 10 MG tablet Take 1 tablet (10 mg total) by mouth daily.  90 tablet  4  . furosemide (LASIX) 40 MG tablet Take 1 tablet (40 mg total) by mouth 2 (two) times daily.  180 tablet  4  . gabapentin (NEURONTIN) 300 MG capsule Take 1 capsule (300 mg total) by mouth daily as needed. Leg Pain  90 capsule  4  . insulin lispro (HUMALOG) 100 UNIT/ML injection PER PUMP  10 mL  5  . isosorbide mononitrate (IMDUR) 120 MG 24 hr tablet Take 1 tablet (120 mg total) by mouth daily.  30 tablet  11  . lisinopril (PRINIVIL,ZESTRIL) 20 MG tablet Take 1 tablet (20 mg total) by mouth 2 (two) times daily.  90 tablet  4  . metFORMIN (GLUCOPHAGE XR) 500 MG 24 hr tablet Take 2 tablets (1,000 mg total) by mouth daily with breakfast.  60 tablet  3  . nitroGLYCERIN (NITROSTAT) 0.4 MG SL tablet Place 1 tablet (0.4 mg total) under the tongue every 5 (five) minutes as needed. For chest pain  30 tablet  11  . potassium chloride SA (K-DUR,KLOR-CON) 20 MEQ tablet  Take 1 tablet (20 mEq total) by mouth daily.  90 tablet  4  . rosuvastatin (CRESTOR) 40 MG tablet Take 1 tablet (40 mg total) by mouth daily.  90 tablet  4  . Vitamin D, Ergocalciferol, (DRISDOL) 50000 UNITS CAPS capsule Take 1 capsule (50,000 Units total) by mouth every 7 (seven) days. Friday  12 capsule  4  . zolpidem (AMBIEN) 10 MG tablet For sleep,1/2 to 1 prn at bedtime  90 tablet  1   No current facility-administered medications for this visit.    Past Medical History  Diagnosis Date  . Diverticulitis, colon   . MS (multiple sclerosis)     Not confirmed  . Depression   . PVD (peripheral vascular disease)   . HLD (hyperlipidemia)   . Essential hypertension, benign   . CAD  (coronary artery disease)     DES to circumflex 02/2007, BMS to LAD and PTCA diagonal 03/2007  . Carotid artery plaque     Mild  . IDDM (insulin dependent diabetes mellitus)   . GERD (gastroesophageal reflux disease)   . NSTEMI (non-ST elevated myocardial infarction)     02/2007  . Prolapse of uterus   . TIA (transient ischemic attack)   . [name] (paroxysmal atrial tachycardia)   . Hemiplegic migraine   . Anxiety   . History of pneumonia     Past Surgical History  Procedure Laterality Date  . Appendectomy    . Cholecystectomy    . Partial hysterectomy  1986    ovaries remain - prolaspe uterus   . Breast lumpectomy      Dr. Luan Moore   . Colonoscopy  2002    Dr. Anwar--> Severe diverticular changes in the region of the sigmoid and descending colon with scattered diverticular changes throughout the rest of the colon. No polyps, ulcerations. Despite numerous manipulations, the tip of the scope could not be tipped into the cecal area.  . Colonoscopy  01/10/2012    Procedure: COLONOSCOPY;  Surgeon: Corbin Ade, MD;  Location: [facility];  Service: Endoscopy;  Laterality: N/A;  1:55    Family History  Problem Relation Age of Onset  . Heart attack Mother 66  . Diabetes Father   . Heart attack Father 2  . Heart attack Brother 30    x6  . Colon cancer Paternal Aunt     1s, died with brain anuerysm  . Crohn's disease Cousin     paternal    Social History [name] reports that she has been passively smoking.  She does not have any smokeless tobacco history on file. [name] reports that she does not drink alcohol.  Review of Systems No palpitations or syncope. No reported bleeding episodes. A recent cardiac hospitalizations. Appetite is fair. Otherwise negative except as outlined.  Physical Examination Filed Vitals:   06/18/13 0915  BP: 133/82  Pulse: 87   Filed Weights   06/18/13 0915  Weight: 179 lb 6.4 oz (81.375 kg)   Patient in no distress. HEENT: Conjunctiva  and lids normal, oropharynx clear. Neck: Supple, no elevated JVP, no significant carotid bruits, no thyromegaly. Lungs: Diminished breath sounds, clear to auscultation, nonlabored breathing at rest. Cardiac: Regular rate and rhythm, no S3 or significant systolic murmur, no pericardial rub. Abdomen: Soft, nontender, bowel sounds present, no guarding or rebound. Extremities: No pitting edema, distal pulses 1+. Decreased DPs bilaterally. Skin: Warm and dry. Musculoskeletal: No kyphosis. Neuropsychiatric: Alert and oriented x3, affect grossly appropriate.   Problem  List and Plan   CAD, NATIVE VESSEL History reviewed, overall medical regimen looks reasonable from a cardiac perspective. She did have low risk ischemic testing a little over a year ago at [facility], although is reporting increased chest pain symptoms in the setting of stress. 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 Plan will be to followup with a Lexis scan Myoview to exclude any progressive ischemic burden that may need to be investigated further. Otherwise we will establish followup and review with her in the office.  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 Mild by carotid Dopplers in 2011. She is on statin and antiplatelet regimen.  HYPERLIPIDEMIA Generally well controlled, continues on statin.  Claudication We will obtain lower extremity arterial Dopplers.    Jonelle Sidle, M.D., F.A.C.C.

## 2013-06-18 NOTE — Assessment & Plan Note (Signed)
Generally well controlled, continues on statin.

## 2013-06-18 NOTE — Patient Instructions (Addendum)
Your physician recommends that you schedule a follow-up appointment in: 3 weeks. You will be contacted about getting this scheduled next week. If you don't hear from [name], please contact us to get this appointment scheduled. Your physician recommends that you continue on your current medications as directed. Please refer to the Current Medication list given to you today. Your physician has requested that you have a lexiscan myoview. For further information please visit [email or web address] Please follow instruction sheet, as given. Your physician has requested that you have an ankle brachial index (ABI). During this test an ultrasound and blood pressure cuff are used to evaluate the arteries that supply the arms and legs with blood. Allow thirty minutes for this exam. There are no restrictions or special instructions.

## 2013-06-18 NOTE — Assessment & Plan Note (Signed)
Mild by carotid Dopplers in 2011. She is on statin and antiplatelet regimen.

## 2013-06-18 NOTE — Assessment & Plan Note (Signed)
History reviewed, overall medical regimen looks reasonable from a cardiac perspective. She did have low risk ischemic testing a little over a year ago at [facility] although is reporting increased chest pain symptoms in the setting of stress. 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 Plan will be to followup with a Lexis scan Myoview to exclude any progressive ischemic burden that may need to be investigated further. Otherwise we will establish followup and review with her in the office.

## 2013-06-18 NOTE — Assessment & Plan Note (Signed)
We will obtain lower extremity arterial Dopplers.

## 2013-06-25 ENCOUNTER — Encounter (INDEPENDENT_AMBULATORY_CARE_PROVIDER_SITE_OTHER): Payer: Medicare Other

## 2013-06-25 DIAGNOSIS — I739 Peripheral vascular disease, unspecified: Secondary | ICD-10-CM

## 2013-06-26 ENCOUNTER — Encounter (HOSPITAL_COMMUNITY)
Admission: RE | Admit: 2013-06-26 | Discharge: 2013-06-26 | Disposition: A | Discharge status: Home / Self Care | Payer: Medicare Other | Source: Ambulatory Visit | Attending: Cardiology | Admitting: Cardiology

## 2013-06-26 ENCOUNTER — Telehealth: Payer: Self-pay | Admitting: *Deleted

## 2013-06-26 ENCOUNTER — Encounter (HOSPITAL_COMMUNITY): Payer: Self-pay

## 2013-06-26 DIAGNOSIS — I2510 Atherosclerotic heart disease of native coronary artery without angina pectoris: Secondary | ICD-10-CM | POA: Insufficient documentation

## 2013-06-26 DIAGNOSIS — R079 Chest pain, unspecified: Secondary | ICD-10-CM | POA: Insufficient documentation

## 2013-06-26 MED ORDER — SODIUM CHLORIDE 0.9 % IJ SOLN
INTRAMUSCULAR | Status: AC
  Administered 2013-06-26: 10 mL via INTRAVENOUS
  Filled 2013-06-26: qty 10

## 2013-06-26 MED ORDER — REGADENOSON 0.4 MG/5ML IV SOLN
INTRAVENOUS | Status: AC
  Administered 2013-06-26: 0.4 mg via INTRAVENOUS
  Filled 2013-06-26: qty 5

## 2013-06-26 MED ORDER — TECHNETIUM TC 99M SESTAMIBI GENERIC - CARDIOLITE
30.0000 | Freq: Once | INTRAVENOUS | Status: AC | PRN
  Administered 2013-06-26: 30 via INTRAVENOUS

## 2013-06-26 MED ORDER — TECHNETIUM TC 99M SESTAMIBI GENERIC - CARDIOLITE
10.0000 | Freq: Once | INTRAVENOUS | Status: AC | PRN
  Administered 2013-06-26: 10 via INTRAVENOUS

## 2013-06-26 NOTE — Progress Notes (Signed)
Stress Lab Nurses Notes - [name]  [name] 06/26/2013  Reason for doing test: CAD and Chest Pain  Type of test: Marlane Hatcher  Nurse performing test: Karen Clipper, RN  Nuclear Medicine Tech: [name]  Echo Tech: Not Applicable  MD performing test: Karen Reining NP/Dr. Nilda Riggs MD: Dr. [name]  Test explained and consent signed: yes  IV started: 22g jelco, Saline lock flushed, No redness or edema and Saline lock started in radiology  Symptoms: none  Treatment/Intervention: None  Reason test stopped: protocol completed  After recovery IV was: Discontinued via X-ray tech, No redness or edema and Saline Lock flushed  Patient to return to Nuc. Med at : 11:15  Patient discharged: Home  Patient's Condition upon discharge was: stable  Comments:  During test BP was 132/68 and HR ws 85 and  Recovery BP was 138/68 and HR was 75. Symptoms resolved in recovery.   Angelica Pou

## 2013-06-26 NOTE — Telephone Encounter (Signed)
Patient informed. 

## 2013-06-26 NOTE — Telephone Encounter (Signed)
Message copied by Eustace Moore on Fri Jun 26, 2013  2:38 PM ------      Message from: Jonelle Sidle      Created: Fri Jun 26, 2013  8:06 AM       Results noted. Normal ABIs, no clear evidence of obstructive arterial disease. ------

## 2013-06-26 NOTE — Telephone Encounter (Signed)
Notes Recorded by Jonelle Sidle, MD on 06/26/2013 at 1:49 PM Reviewed report. Similar findings to previous assessment with probable scar in the lateral wall, normal LVEF. Overall low risk. Unless her chest pain symptoms escalate further, we will likely continue medical therapy. Discuss further in the office.

## 2013-06-26 NOTE — Telephone Encounter (Signed)
Message copied by Eustace Moore on Fri Jun 26, 2013  4:18 PM ------      Message from: [name]      Created: Fri Jun 26, 2013  3:53 PM       [facility] pt ------

## 2013-06-29 ENCOUNTER — Ambulatory Visit: Payer: Self-pay

## 2013-07-13 ENCOUNTER — Ambulatory Visit: Payer: Medicare Other | Admitting: Cardiology

## 2013-07-23 ENCOUNTER — Telehealth: Payer: Self-pay | Admitting: Pharmacist

## 2013-07-23 NOTE — Telephone Encounter (Signed)
Left #2 vials of Humalog for patient.  Patient notified

## 2013-07-27 ENCOUNTER — Ambulatory Visit (INDEPENDENT_AMBULATORY_CARE_PROVIDER_SITE_OTHER): Payer: Medicare Other | Admitting: Pharmacist

## 2013-07-27 ENCOUNTER — Telehealth: Payer: Self-pay | Admitting: Pharmacist

## 2013-07-27 ENCOUNTER — Telehealth: Payer: Self-pay | Admitting: Family Medicine

## 2013-07-27 VITALS — BP 142/88 | HR 78 | Ht 66.0 in | Wt 180.0 lb

## 2013-07-27 DIAGNOSIS — E1165 Type 2 diabetes mellitus with hyperglycemia: Secondary | ICD-10-CM

## 2013-07-27 NOTE — Telephone Encounter (Signed)
Appt made for patient today 07/27/13 at 10:30am

## 2013-07-27 NOTE — Progress Notes (Signed)
Diabetes Follow-Up Visit Chief Complaint:   Chief Complaint  Patient presents with  . Diabetes     Filed Vitals:   07/27/13 1055  BP: 142/88  Pulse: 78   Filed Weights   07/27/13 1055  Weight: 180 lb (81.647 kg)     HPI: patient has had an insulin pump for about 1 year but she has not been in for adjustment in several months due to dealing with her husband's poor health.  Her current pump setting are:  Basal Rate:    0.750 units/hr   CHO ratio - 7.5  Insulin sensitivity - 12  BG goal 120  Patient has keto sticks at home and tests when BG greater than 400.  She has not had any positive urine ketone readings.  Exam Edema:  Trace  Polyuria:  positive  Polydipsia:  negative Polyphagia:  negative  BMI:  Body mass index is 29.07 kg/(m^2).   Weight changes:  stable General Appearance:  alert, oriented, no acute distress and obese Mood/Affect:  normal   Low fat/carbohydrate diet?  No Nicotine Abuse?  No Medication Compliance?  Yes Exercise?  No Alcohol Abuse?  No  Home BG Monitoring:  Checking 5 times a day. Average:  319   High: over 400  Low:  108    Lab Results  Component Value Date   HGBA1C 9.2 06/11/2013       Assessment: 1.  Diabetes.  uncontrolled 2.  Blood Pressure.  good  Recommendations: 1.  Medication recommendations at this time are as follows:    Increase basal rate to  1.0 units/hr 2.  Reviewed HBG goals:  Fasting 80-130 and 1-2 hour post prandial <180.  Patient is instructed to check BG 5 times per day.    3.  BP goal < 140/80. 4.  LDL goal of < 100, HDL > 40 and TG < 150. 5.  Eye Exam yearly and Dental Exam every 6 months. 6.  Dietary recommendations:  Limit serving sizes of high CHO foods 7.  Physical Activity recommendations:  Aim to move more - try 10 minutes daily to start 8.  Reviewed plan when BG is greater than 400.   - patient is to check ketones  - change infusion set and give correction dose of insulin based on BG  - check BG 30  minutes to 1 hours after changing infusion.  If BG has not improved then she is to give supplemental insulin by syringe and call office.   8.  Return to clinic in 2 wks with [facility] and 4 weeks with Dr. [name]   Time spent counseling patient:  30 minutes   Henrene Pastor, PharmD, CPP

## 2013-07-29 ENCOUNTER — Ambulatory Visit: Payer: Self-pay

## 2013-07-29 ENCOUNTER — Telehealth: Payer: Self-pay | Admitting: Pharmacist

## 2013-07-29 NOTE — Telephone Encounter (Signed)
Her current pump setting are: (last changed Monday 07/27/2013) Basal Rate:  1.00 units/hr  CHO ratio - 7.5  Insulin sensitivity - 12  BG goal 120    BG still in the 200's in am and 300's later in the day. Made the following changes  Basal Rate:  1.15 units/hour CHO ration to 7.0 Insulin Sensitivity - 10 BG goal 120

## 2013-07-30 NOTE — Telephone Encounter (Signed)
This phone call has already been answered.  Patient came into office 07/29/2013 and I adjusted insulin pump settings.

## 2013-08-24 ENCOUNTER — Ambulatory Visit (INDEPENDENT_AMBULATORY_CARE_PROVIDER_SITE_OTHER): Payer: Medicare Other | Admitting: Pharmacist

## 2013-08-24 VITALS — BP 140/82 | HR 74 | Ht 66.0 in | Wt 181.0 lb

## 2013-08-24 DIAGNOSIS — E1165 Type 2 diabetes mellitus with hyperglycemia: Secondary | ICD-10-CM

## 2013-08-24 NOTE — Progress Notes (Signed)
Diabetes Follow-Up Visit Chief Complaint:   No chief complaint on file.    Filed Vitals:   08/24/13 0958  BP: 140/82  Pulse: 74   Filed Weights   08/24/13 0958  Weight: 181 lb (82.101 kg)     HPI:  Patient has had an insulin pump for about 1 year though she has just recently been following up regularly for adjustments to her pump due to her husband's health (which has improved over the last 3-4 months) Patient has tried metformin regular and XR and very low dose but has not been able to tolerate in the past due to diarrhea and nausea.  Her current pump setting are:  Basal Rate: 1.30 units/hour   CHO ration to 7.0   Insulin Sensitivity - 10   BG goal 120  Home BG Monitoring:  Checking 5 times a day. Average:  285  (was 315 about 1 month ago) High: 356  Low:  205   Patient has keto sticks at home and tests when BG greater than 400.  She has not had any positive urine ketone readings.  Exam Edema:  Trace  Polyuria:  positive  Polydipsia:  negative Polyphagia:  negative  BMI:  Body mass index is 29.23 kg/(m^2).   Weight changes:  stable General Appearance:  alert, oriented, no acute distress and obese Mood/Affect:  normal   Low fat/carbohydrate diet?  No but is decreasing portion sizes more lately Nicotine Abuse?  No Medication Compliance?  Yes Exercise?  No Alcohol Abuse?  No   Lab Results  Component Value Date   HGBA1C 9.2 06/11/2013     Assessment: 1.  Diabetes.  Uncontrolled but improving 2.  Blood Pressure.  good  Recommendations: 1.  Medication recommendations at this time are as follows:    Increase basal rate to  1.55 units/hr 2.  Reviewed HBG goals:  Fasting 80-130 and 1-2 hour post prandial <180.  Patient is instructed to check BG 5 times per day.    3.  BP goal < 140/80. 4.  Dietary recommendations:  Continue to limit serving sizes of high CHO foods and reduce caloric intake by increased fresh vegetables 7.  Physical Activity recommendations:  Again  encouraged patient to move more - try 10 minutes daily to start.  Suggested Silver Sneakers program.  8.  Return to clinic in 4 wks with [facility] and 2 weeks with Dr. [name]   Time spent counseling patient:  30 minutes   Henrene Pastor, PharmD, CPP

## 2013-08-24 NOTE — Patient Instructions (Signed)

## 2013-09-04 ENCOUNTER — Other Ambulatory Visit: Payer: Self-pay | Admitting: Family Medicine

## 2013-09-04 MED ORDER — ALPRAZOLAM 0.5 MG PO TABS: 0.5000 mg | ORAL_TABLET | Freq: Three times a day (TID) | ORAL | Status: DC | PRN

## 2013-09-04 NOTE — Telephone Encounter (Signed)
rx called into pharmacy

## 2013-09-04 NOTE — Telephone Encounter (Signed)
Please call in xanax rx with 1 refill 

## 2013-09-14 ENCOUNTER — Encounter: Payer: Self-pay | Admitting: Family Medicine

## 2013-09-14 ENCOUNTER — Telehealth: Payer: Self-pay | Admitting: *Deleted

## 2013-09-14 ENCOUNTER — Ambulatory Visit (INDEPENDENT_AMBULATORY_CARE_PROVIDER_SITE_OTHER): Payer: Medicare Other | Admitting: Family Medicine

## 2013-09-14 VITALS — BP 119/74 | HR 85 | Temp 97.2°F | Ht 66.0 in | Wt 179.0 lb

## 2013-09-14 DIAGNOSIS — E559 Vitamin D deficiency, unspecified: Secondary | ICD-10-CM

## 2013-09-14 DIAGNOSIS — E785 Hyperlipidemia, unspecified: Secondary | ICD-10-CM

## 2013-09-14 DIAGNOSIS — E1165 Type 2 diabetes mellitus with hyperglycemia: Secondary | ICD-10-CM

## 2013-09-14 DIAGNOSIS — F411 Generalized anxiety disorder: Secondary | ICD-10-CM

## 2013-09-14 DIAGNOSIS — I10 Essential (primary) hypertension: Secondary | ICD-10-CM

## 2013-09-14 DIAGNOSIS — IMO0002 Reserved for concepts with insufficient information to code with codable children: Secondary | ICD-10-CM

## 2013-09-14 DIAGNOSIS — K219 Gastro-esophageal reflux disease without esophagitis: Secondary | ICD-10-CM

## 2013-09-14 LAB — POCT CBC
Granulocyte percent: 59.2 %G (ref 37–80)
HCT, POC: 42.5 % (ref 37.7–47.9)
Hemoglobin: 13.6 g/dL (ref 12.2–16.2)
Lymph, poc: 2.5 (ref 0.6–3.4)
MCH, POC: 28.1 pg (ref 27–31.2)
MCV: 87.7 fL (ref 80–97)
POC Granulocyte: 3.7 (ref 2–6.9)
RBC: 4.8 M/uL (ref 4.04–5.48)

## 2013-09-14 LAB — POCT GLYCOSYLATED HEMOGLOBIN (HGB A1C): Hemoglobin A1C: 9.8

## 2013-09-14 NOTE — Telephone Encounter (Signed)
Called patient  - adjusted basal rate to 1.75 units/hour over the phone. appt set up appt for 09/25/13

## 2013-09-14 NOTE — Progress Notes (Signed)
Subjective:    Patient ID: [name], female    DOB: 1951/06/24, 62 y.o.   MRN: [id number]  HPI Pt here for follow up and management of chronic medical problems. Patient has a list of medical problems. She is followed by the cardiologist. She's had a lot of stress in her family especially with the illness of her husband. Her blood pressure was review before going into the  exam room today and this was good. She has seen the cardiologist about 3 or 4 months ago and they did Dopplers of her lower extremities and these were okay according to patient.      Patient Active Problem List   Diagnosis Date Noted  . Claudication 06/18/2013  . Diverticulosis of colon 01/01/2012  . Left lower quadrant pain 01/01/2012  . GERD (gastroesophageal reflux disease) 01/01/2012  . Constipation 01/01/2012  . Hypokalemia 08/24/2011  . Diabetes type 2, uncontrolled 08/23/2011  . Essential hypertension, benign 08/23/2011  . PALPITATIONS 02/10/2010  . HYPERLIPIDEMIA 05/10/2009  . DEPRESSION 05/10/2009  . MULTIPLE SCLEROSIS 05/10/2009  . CAD, NATIVE VESSEL 08/26/2008  . 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 ALPRAZolam (XANAX) 0.5 MG tablet Take 1 tablet (0.5 mg total) by mouth 3 (three) times daily as needed. For anxiety  . aspirin 325 MG tablet Take 325 mg by mouth daily.    Marland Kitchen atenolol (TENORMIN) 50 MG tablet Take 1 tablet (50 mg total) by mouth 2 (two) times daily.  . citalopram (CELEXA) 20 MG tablet Take 1 tablet (20 mg total) by mouth daily.  . clopidogrel (PLAVIX) 75 MG tablet Take 1 tablet (75 mg total) by mouth daily.  Marland Kitchen esomeprazole (NEXIUM) 40 MG capsule Take 1 capsule (40 mg total) by mouth daily as needed. Indigestion  . ezetimibe (ZETIA) 10 MG tablet Take 1 tablet (10 mg total) by mouth daily.  . furosemide (LASIX) 40 MG tablet Take 1 tablet (40 mg total) by mouth 2 (two) times daily.  Marland Kitchen gabapentin (NEURONTIN) 300 MG  capsule Take 1 capsule (300 mg total) by mouth daily as needed. Leg Pain  . insulin lispro (HUMALOG) 100 UNIT/ML injection PER PUMP  . isosorbide mononitrate (IMDUR) 120 MG 24 hr tablet Take 1 tablet (120 mg total) by mouth daily.  Marland Kitchen lisinopril (PRINIVIL,ZESTRIL) 20 MG tablet Take 1 tablet (20 mg total) by mouth 2 (two) times daily.  . nitroGLYCERIN (NITROSTAT) 0.4 MG SL tablet Place 1 tablet (0.4 mg total) under the tongue every 5 (five) minutes as needed. For chest pain  . potassium chloride SA (K-DUR,KLOR-CON) 20 MEQ tablet Take 1 tablet (20 mEq total) by mouth daily.  . rosuvastatin (CRESTOR) 40 MG tablet Take 1 tablet (40 mg total) by mouth daily.  . Vitamin D, Ergocalciferol, (DRISDOL) 50000 UNITS CAPS capsule Take 1 capsule (50,000 Units total) by mouth every 7 (seven) days. Friday  . zolpidem (AMBIEN) 10 MG tablet For sleep,1/2 to 1 prn at bedtime    Review of Systems  Constitutional: Negative.   HENT: Negative.   Eyes: Negative.   Respiratory: Negative.   Cardiovascular: Negative.   Gastrointestinal: Negative.   Endocrine: Negative.   Genitourinary: Negative.   Musculoskeletal: Negative.   Skin: Negative.   Allergic/Immunologic: Negative.   Neurological: Negative.   Hematological: Negative.   Psychiatric/Behavioral: Negative.        Objective:   Physical Exam  Nursing note and vitals reviewed. Constitutional: She is oriented to  person, place, and time. She appears well-developed and well-nourished. No distress.  Pleasant and cooperative and not complaining  HENT:  Head: Normocephalic and atraumatic.  Right Ear: External ear normal.  Left Ear: External ear normal.  Nose: Nose normal.  Mouth/Throat: Oropharynx is clear and moist.  Eyes: Conjunctivae and EOM are normal. Pupils are equal, round, and reactive to light. Right eye exhibits no discharge. Left eye exhibits no discharge. No scleral icterus.  Neck: Normal range of motion. Neck supple. No JVD present. No  thyromegaly present.  No carotid bruits  Cardiovascular: Normal rate, regular rhythm, normal heart sounds and intact distal pulses.  Exam reveals no gallop and no friction rub.   No murmur heard. At 72 per minute  Pulmonary/Chest: Effort normal and breath sounds normal. No respiratory distress. She has no wheezes. She has no rales. She exhibits no tenderness.  No axillary nodes  Abdominal: Soft. Bowel sounds are normal. She exhibits no mass. There is no tenderness. There is no rebound and no guarding.  Obesity present and no inguinal nodes  Musculoskeletal: Normal range of motion. She exhibits no edema and no tenderness.  Lymphadenopathy:    She has no cervical adenopathy.  Neurological: She is alert and oriented to person, place, and time. She has normal reflexes. No cranial nerve deficit.  Skin: Skin is warm and dry.  Psychiatric: She has a normal mood and affect. Her behavior is normal. Judgment and thought content normal.   BP 119/74  Pulse 85  Temp(Src) 97.2 F (36.2 C) (Oral)  Ht 5\' 6"  (1.676 m)  Wt 179 lb (81.194 kg)  BMI 28.91 kg/m2        Assessment & Plan:  1. GERD (gastroesophageal reflux disease) - POCT CBC  2. HYPERLIPIDEMIA - POCT CBC - NMR, lipoprofile  3. Essential hypertension, benign - POCT CBC - BMP8+EGFR - Hepatic function panel  4. Diabetes type 2, uncontrolled - POCT CBC - POCT glycosylated hemoglobin (Hb A1C)  5. Vitamin D deficiency - Vit D  25 hydroxy (rtn osteoporosis monitoring)  6. Generalized anxiety disorder  Orders Placed This Encounter  Procedures  . BMP8+EGFR  . Hepatic function panel  . NMR, lipoprofile  . Vit D  25 hydroxy (rtn osteoporosis monitoring)  . POCT CBC  . POCT glycosylated hemoglobin (Hb A1C)   No orders of the defined types were placed in this encounter.   Patient Instructions  Continue current medications. Continue good therapeutic lifestyle changes which include good diet and exercise. Fall precautions  discussed with patient. Schedule your flu vaccine if you haven't had it yet If you are over 49 years old - you may need Prevnar 13 or the adult Pneumonia vaccine. Try to drink plenty of fluids Watch sodium intake in the diet Continue to monitor blood sugar closely at home and blood pressure Patient will check with her insurance regarding the Prevnar vaccine She should return the FOBT once it is check She will be schedule an appointment for a pelvic exam  She will also check with her insurance regarding the shingles shot   Nyra Capes MD

## 2013-09-14 NOTE — Telephone Encounter (Signed)
Pump needs another adjustment. BS running 300- 400, 250's fasting. Next pharm appt 10-19-12. Please address

## 2013-09-14 NOTE — Patient Instructions (Addendum)
Continue current medications. Continue good therapeutic lifestyle changes which include good diet and exercise. Fall precautions discussed with patient. Schedule your flu vaccine if you haven't had it yet If you are over 64 years old - you may need Prevnar 13 or the adult Pneumonia vaccine. Try to drink plenty of fluids Watch sodium intake in the diet Continue to monitor blood sugar closely at home and blood pressure Patient will check with her insurance regarding the Prevnar vaccine She should return the FOBT once it is check She will be schedule an appointment for a pelvic exam  She will also check with her insurance regarding the shingles shot

## 2013-09-15 LAB — NMR, LIPOPROFILE
Cholesterol: 160 mg/dL (ref <200)
HDL Cholesterol by NMR: 30 mg/dL — ABNORMAL LOW (ref >=40)
HDL Particle Number: 23.2 umol/L — ABNORMAL LOW (ref >=30.5)
LP-IR Score: 64 — ABNORMAL HIGH (ref <=45)
Small LDL Particle Number: 1176 nmol/L — ABNORMAL HIGH (ref <=527)

## 2013-09-15 LAB — BMP8+EGFR
BUN/Creatinine Ratio: 10 — ABNORMAL LOW (ref 11–26)
BUN: 9 mg/dL (ref 8–27)
Creatinine, Ser: 0.87 mg/dL (ref 0.57–1.00)
GFR calc Af Amer: 81 mL/min/{1.73_m2} (ref >59)

## 2013-09-15 LAB — HEPATIC FUNCTION PANEL
ALT: 17 IU/L (ref 0–32)
AST: 17 IU/L (ref 0–40)
Albumin: 4.1 g/dL (ref 3.6–4.8)
Alkaline Phosphatase: 69 IU/L (ref 39–117)
Bilirubin, Direct: 0.2 mg/dL (ref 0.00–0.40)
Total Bilirubin: 0.6 mg/dL (ref 0.0–1.2)

## 2013-09-15 LAB — VITAMIN D 25 HYDROXY (VIT D DEFICIENCY, FRACTURES): Vit D, 25-Hydroxy: 31.9 ng/mL (ref 30.0–100.0)

## 2013-09-25 ENCOUNTER — Ambulatory Visit (INDEPENDENT_AMBULATORY_CARE_PROVIDER_SITE_OTHER): Payer: Medicare HMO | Admitting: Pharmacist

## 2013-09-25 ENCOUNTER — Encounter: Payer: Self-pay | Admitting: Pharmacist

## 2013-09-25 VITALS — BP 136/80 | HR 78 | Ht 66.0 in | Wt 181.0 lb

## 2013-09-25 DIAGNOSIS — E1165 Type 2 diabetes mellitus with hyperglycemia: Secondary | ICD-10-CM

## 2013-09-25 DIAGNOSIS — I10 Essential (primary) hypertension: Secondary | ICD-10-CM

## 2013-09-25 DIAGNOSIS — IMO0002 Reserved for concepts with insufficient information to code with codable children: Secondary | ICD-10-CM

## 2013-09-25 DIAGNOSIS — IMO0001 Reserved for inherently not codable concepts without codable children: Secondary | ICD-10-CM

## 2013-09-25 MED ORDER — INSULIN LISPRO 100 UNIT/ML ~~LOC~~ SOLN: SUBCUTANEOUS | Status: DC

## 2013-09-25 MED ORDER — CANAGLIFLOZIN 100 MG PO TABS: 100.0000 mg | ORAL_TABLET | Freq: Every morning | ORAL | Status: DC

## 2013-09-25 NOTE — Progress Notes (Signed)
Diabetes Follow-Up Visit Chief Complaint:   Chief Complaint  Patient presents with  . Diabetes    recheck pump     There were no vitals filed for this visit. Filed Weights   09/25/13 0840  Weight: 181 lb (82.101 kg)     HPI:  Patient has had an insulin pump for about 1 year though she has just recently been following up regularly for adjustments to her pump due to her husband's health (which has improved over the last 3-4 months) Patient has tried metformin regular and XR and very low dose but has not been able to tolerate in the past due to diarrhea and nausea. Despite increases in basal rate her BG continues to be elevated and A1c shows uncontrolled DM.  Her current pump setting are:  Basal Rate: 1.75 units/hour   CHO ration to 7.0   Insulin Sensitivity - 10   BG goal 120  Home BG Monitoring:  Checking 5 times a day. Average:  294   High: over 400  Low:  153   Patient has keto sticks at home and tests when BG greater than 400.  She has not had any positive urine ketone readings.  Exam Edema:  Trace  Polyuria:  positive  Polydipsia:  Positive                Polyphagia:  negative  BMI:  Body mass index is 29.23 kg/(m^2).   Weight changes:  stable General Appearance:  alert, oriented, no acute distress and obese Mood/Affect:  normal   Low fat/carbohydrate diet?  Yes and is decreasing portion sizes. Nicotine Abuse?  No Medication Compliance?  Yes Exercise?  No Alcohol Abuse?  No   Lab Results  Component Value Date   HGBA1C 9.8% 09/14/2013     Assessment: 1.  Diabetes.  Uncontrolled 2.  Blood Pressure.  good  Recommendations: 1.  Medication recommendations at this time are as follows:    Increase basal rate to  1.85 units/hr  Start Invokana 100mg  1 tablet daily. 2.  Reviewed HBG goals:  Fasting 80-130 and 1-2 hour post prandial <180.  Patient is instructed to check BG 5 times per day.    3.  BP goal < 140/80. 4.  Dietary recommendations:  Continue to limit  serving sizes of high CHO foods and reduce caloric intake by increased fresh vegetables 7.  Physical Activity recommendations:  Again encouraged patient to move more - try 10 minutes daily to start.  Suggested Silver Sneakers program.  8.  Return to clinic in 2 wks   Time spent counseling patient:  30 minutes   Cherre Robins, PharmD, CPP

## 2013-10-14 ENCOUNTER — Ambulatory Visit: Payer: Medicare Other | Admitting: Nurse Practitioner

## 2013-10-19 ENCOUNTER — Ambulatory Visit (INDEPENDENT_AMBULATORY_CARE_PROVIDER_SITE_OTHER): Payer: Medicare HMO | Admitting: Pharmacist

## 2013-10-19 ENCOUNTER — Other Ambulatory Visit: Payer: Self-pay | Admitting: *Deleted

## 2013-10-19 ENCOUNTER — Encounter: Payer: Self-pay | Admitting: Pharmacist

## 2013-10-19 VITALS — BP 132/68 | HR 72 | Ht 66.0 in | Wt 181.2 lb

## 2013-10-19 DIAGNOSIS — IMO0002 Reserved for concepts with insufficient information to code with codable children: Secondary | ICD-10-CM

## 2013-10-19 DIAGNOSIS — E1165 Type 2 diabetes mellitus with hyperglycemia: Secondary | ICD-10-CM

## 2013-10-19 DIAGNOSIS — I10 Essential (primary) hypertension: Secondary | ICD-10-CM

## 2013-10-19 DIAGNOSIS — IMO0001 Reserved for inherently not codable concepts without codable children: Secondary | ICD-10-CM

## 2013-10-19 MED ORDER — PROMETHAZINE HCL 25 MG PO TABS: ORAL_TABLET | ORAL | Status: DC

## 2013-10-19 NOTE — Progress Notes (Addendum)
Diabetes Follow-Up Visit Chief Complaint:   Chief Complaint  Patient presents with  . Diabetes     Filed Vitals:   10/19/13 1540  BP: 132/68  Pulse: 72   Filed Weights   10/19/13 1540  Weight: 181 lb 4 oz (82.214 kg)    HPI:  Patient has had an insulin pump for about 1 year.   She has been following up regularly for adjustments to her pump.  She was seen about 2 weeks ago and basal rate was increased and Invokana 100mg  1 tablet daily added to her regimen.  Patient has tried metformin regular and XR and very low dose but has not been able to tolerate in the past due to diarrhea and nausea. Despite increases in basal rate her BG continues to be elevated and A1c shows uncontrolled DM.  Her current pump setting are:  Basal Rate: 1.75 units/hour (per pump report although was suppose to be 1.85 units/hour)  CHO ration to 7.0   Insulin Sensitivity - 10   BG goal 120  Active Insulin Time - 4 hours  Home BG Monitoring:  Checking 3 times a day. Average:  261 (down from 290's) High: over 400   Low:  127 (down from 150's)   Patient has keto sticks at home and tests when BG greater than 400.  She has not had any positive urine ketone readings.  Exam Edema:  trace  Polyuria:  improved Polydipsia:  negative             Polyphagia:  negative  BMI:  Body mass index is 29.27 kg/(m^2).   Weight changes:  stable General Appearance:  alert, oriented, no acute distress and obese Mood/Affect:  normal   Low fat/carbohydrate diet?  Yes - continues to limit CHO portions sizes  Nicotine Abuse?  No Medication Compliance?  Yes Exercise?  No Alcohol Abuse?  No   Lab Results  Component Value Date   HGBA1C 9.8% 09/14/2013     Assessment: 1.  Diabetes.  Uncontrolled but improving 2.  Blood Pressure.  good  Recommendations: 1.  Medication recommendations at this time are as follows:    Change basal rate to  1.80 units/hr from midnight to 6pm, and 1.75 from 6pm to midnight.  Continue  Invokana 100mg  1 tablet daily - checking BMET today - if SCr normal then will consider increase to 300mg  daily. 2.  Reviewed HBG goals:  Fasting 80-130 and 1-2 hour post prandial <180.  Patient is instructed to check BG 5 times per day.    3.  BP goal < 140/80. 4.  Dietary recommendations:  Continue to limit serving sizes of high CHO foods and reduce caloric intake by increased fresh vegetables 7.  Physical Activity recommendations:  Again encouraged patient to move more - try 10 minutes daily to start.  Suggested Silver Sneakers program.  8.  Return to clinic in 4 wks   Time spent counseling patient:  30 minutes   Cherre Robins, PharmD, CPP

## 2013-10-20 LAB — BMP8+EGFR
BUN/Creatinine Ratio: 9 — ABNORMAL LOW (ref 11–26)
BUN: 8 mg/dL (ref 8–27)
CO2: 22 mmol/L (ref 18–29)
Calcium: 9.2 mg/dL (ref 8.7–10.3)
Chloride: 103 mmol/L (ref 97–108)
Creatinine, Ser: 0.87 mg/dL (ref 0.57–1.00)
GFR calc non Af Amer: 71 mL/min/{1.73_m2} (ref >59)
GFR, EST AFRICAN AMERICAN: 81 mL/min/{1.73_m2} (ref >59)
Glucose: 180 mg/dL — ABNORMAL HIGH (ref 65–99)
Potassium: 3.9 mmol/L (ref 3.5–5.2)
Sodium: 141 mmol/L (ref 134–144)

## 2013-10-21 ENCOUNTER — Telehealth: Payer: Self-pay | Admitting: Pharmacist

## 2013-10-21 MED ORDER — CANAGLIFLOZIN 300 MG PO TABS: 300.0000 mg | ORAL_TABLET | Freq: Every day | ORAL | Status: DC

## 2013-10-21 NOTE — Telephone Encounter (Signed)
Results from BMET normal - increase invokana to 300mg  dialy

## 2013-10-27 ENCOUNTER — Telehealth: Payer: Self-pay | Admitting: Pharmacist Clinician (PhC)/ Clinical Pharmacy Specialist

## 2013-10-27 NOTE — Telephone Encounter (Signed)
Calling about INvokana dose was supposed to be called in for 300mg  strength.  Left samples up front for patient.

## 2013-10-29 ENCOUNTER — Encounter: Payer: Self-pay | Admitting: Nurse Practitioner

## 2013-10-29 ENCOUNTER — Other Ambulatory Visit: Payer: Self-pay | Admitting: *Deleted

## 2013-10-29 ENCOUNTER — Ambulatory Visit (INDEPENDENT_AMBULATORY_CARE_PROVIDER_SITE_OTHER): Payer: Medicare HMO | Admitting: Nurse Practitioner

## 2013-10-29 VITALS — BP 150/89 | HR 89 | Temp 97.0°F | Ht 66.0 in | Wt 176.0 lb

## 2013-10-29 DIAGNOSIS — Z124 Encounter for screening for malignant neoplasm of cervix: Secondary | ICD-10-CM

## 2013-10-29 DIAGNOSIS — Z01419 Encounter for gynecological examination (general) (routine) without abnormal findings: Secondary | ICD-10-CM

## 2013-10-29 LAB — POCT UA - MICROSCOPIC ONLY
Bacteria, U Microscopic: NEGATIVE
Casts, Ur, LPF, POC: NEGATIVE
Crystals, Ur, HPF, POC: NEGATIVE
Mucus, UA: NEGATIVE

## 2013-10-29 LAB — POCT URINALYSIS DIPSTICK
Bilirubin, UA: NEGATIVE
Glucose, UA: NEGATIVE
KETONES UA: NEGATIVE
Nitrite, UA: NEGATIVE
PROTEIN UA: NEGATIVE
Spec Grav, UA: 1.01
UROBILINOGEN UA: NEGATIVE
pH, UA: 6

## 2013-10-29 MED ORDER — ATENOLOL 50 MG PO TABS: 50.0000 mg | ORAL_TABLET | Freq: Two times a day (BID) | ORAL | Status: DC

## 2013-10-29 MED ORDER — FUROSEMIDE 40 MG PO TABS: 40.0000 mg | ORAL_TABLET | Freq: Two times a day (BID) | ORAL | Status: DC

## 2013-10-29 MED ORDER — CLOPIDOGREL BISULFATE 75 MG PO TABS: 75.0000 mg | ORAL_TABLET | Freq: Every day | ORAL | Status: DC

## 2013-10-29 NOTE — Progress Notes (Signed)
   Subjective:    Patient ID: [name], female    DOB: 1951-03-21, 63 y.o.   MRN: [id number]  HPI Patient in today for PAP and breast exam. SHe is a regular patient of Dr. Laurance Flatten and was seen for follow-up in January and as another appointment in April. SHe is doing well without complaints.    Review of Systems  Constitutional: Negative.   HENT: Negative.   Eyes: Negative.   Respiratory: Negative.   Cardiovascular: Negative.   Gastrointestinal: Negative.   Genitourinary: Negative.   Psychiatric/Behavioral: Negative.   All other systems reviewed and are negative.       Objective:   Physical Exam  Constitutional: She is oriented to person, place, and time. She appears well-developed and well-nourished.  HENT:  Head: Normocephalic.  Right Ear: Hearing, tympanic membrane, external ear and ear canal normal.  Left Ear: Hearing, tympanic membrane, external ear and ear canal normal.  Nose: Nose normal.  Mouth/Throat: Uvula is midline and oropharynx is clear and moist.  Eyes: Conjunctivae and EOM are normal. Pupils are equal, round, and reactive to light.  Neck: Normal range of motion and full passive range of motion without pain. Neck supple. No JVD present. Carotid bruit is not present. No mass and no thyromegaly present.  Cardiovascular: Normal rate, normal heart sounds and intact distal pulses.   No murmur heard. Pulmonary/Chest: Effort normal and breath sounds normal. Right breast exhibits no inverted nipple, no mass, no nipple discharge, no skin change and no tenderness. Left breast exhibits no inverted nipple, no mass, no nipple discharge, no skin change and no tenderness.  Abdominal: Soft. Bowel sounds are normal. She exhibits no mass. There is no tenderness.  Genitourinary: Vagina normal and uterus normal. No breast swelling, tenderness, discharge or bleeding.  bimanual exam-No adnexal masses or tenderness. Vaginal cuff intact  Musculoskeletal: Normal range of  motion.  Lymphadenopathy:    She has no cervical adenopathy.  Neurological: She is alert and oriented to person, place, and time.  Skin: Skin is warm and dry.  Psychiatric: She has a normal mood and affect. Her behavior is normal. Judgment and thought content normal.   BP 150/89  Pulse 89  Temp(Src) 97 F (36.1 C) (Oral)  Ht 5\' 6"  (1.676 m)  Wt 176 lb (79.833 kg)  BMI 28.42 kg/m2        Assessment & Plan:   1. Encounter for routine gynecological examination    Pap pending Keep follow up appointment with Dr. Mayra Neer, [place]

## 2013-10-29 NOTE — Patient Instructions (Signed)

## 2013-10-29 NOTE — Progress Notes (Signed)
Medication refills were printed instead of being sent to [facility] electronically. This was corrected.

## 2013-10-29 NOTE — Addendum Note (Signed)
Addended by: Ilean China on: 10/29/2013 05:10 PM   Modules accepted: Orders

## 2013-10-30 LAB — PAP IG (IMAGE GUIDED): PAP Smear Comment: 0

## 2013-11-20 ENCOUNTER — Other Ambulatory Visit: Payer: Medicare HMO

## 2013-11-20 DIAGNOSIS — N390 Urinary tract infection, site not specified: Secondary | ICD-10-CM

## 2013-11-20 NOTE — Progress Notes (Signed)
Pt came in for urine culture only

## 2013-11-22 LAB — URINE CULTURE: Organism ID, Bacteria: NO GROWTH

## 2013-11-23 ENCOUNTER — Encounter: Payer: Self-pay | Admitting: *Deleted

## 2013-11-26 ENCOUNTER — Ambulatory Visit (INDEPENDENT_AMBULATORY_CARE_PROVIDER_SITE_OTHER): Payer: Medicare HMO | Admitting: Pharmacist

## 2013-11-26 ENCOUNTER — Encounter: Payer: Self-pay | Admitting: Pharmacist

## 2013-11-26 VITALS — BP 124/72 | HR 84 | Ht 66.0 in | Wt 176.0 lb

## 2013-11-26 DIAGNOSIS — I10 Essential (primary) hypertension: Secondary | ICD-10-CM

## 2013-11-26 DIAGNOSIS — IMO0001 Reserved for inherently not codable concepts without codable children: Secondary | ICD-10-CM

## 2013-11-26 DIAGNOSIS — IMO0002 Reserved for concepts with insufficient information to code with codable children: Secondary | ICD-10-CM

## 2013-11-26 DIAGNOSIS — E785 Hyperlipidemia, unspecified: Secondary | ICD-10-CM

## 2013-11-26 DIAGNOSIS — E1165 Type 2 diabetes mellitus with hyperglycemia: Secondary | ICD-10-CM

## 2013-11-26 NOTE — Progress Notes (Signed)
Diabetes Follow-Up Visit Chief Complaint:   No chief complaint on file.    Filed Vitals:   11/26/13 0849  BP: 124/72  Pulse: 84   Filed Weights   11/26/13 0849  Weight: 176 lb (79.833 kg)    HPI:  Patient has had an insulin pump for about 1 year.   She has been following up regularly for adjustments to her pump.  She was seen about 6 weeks ago and basal rate was increased and Invokana increased to 300mg  1 tablet daily.  Patient has tried metformin regular and XR and very low dose but has not been able to tolerate in the past due to diarrhea and nausea.   Her current pump setting are:  Basal Rate: suppose to be - midnight to 6pm = 1.80 units/hour  And 6pm to midnight = 1.75 units/hour   BUT pump report states ....midnight to 8am = 1.75; 8am to 6pm = 1.80; 8pm to midnight = 1.75  CHO ration to 7.0   Insulin Sensitivity - 10   BG goal 110  Active Insulin Time - 4 hours  Home BG Monitoring:  Checking 2 times a day. Average:  28 High: over 400   Low:  110   Patient has keto sticks at home and tests when BG greater than 400.  She has not had any positive urine ketone readings.  Exam Edema:  trace  Polyuria:  negative Polydipsia:  Not today but occasionally              Polyphagia:  negative  BMI:  Body mass index is 28.42 kg/(m^2).   Weight changes:  stable General Appearance:  alert, oriented, no acute distress and obese Mood/Affect:  normal   Low fat/carbohydrate diet?  Yes - continues to limit CHO portions sizes  Nicotine Abuse?  No Medication Compliance?  Yes Exercise?  No Alcohol Abuse?  No   Lab Results  Component Value Date   HGBA1C 9.8% 09/14/2013     Assessment: 1.  Diabetes.  Uncontrolled  2.  Blood Pressure.  good  Recommendations: 1.  Medication recommendations at this time are as follows:    Change basal rate to  1.85 units/hr from midnight to midnight  Continue Invokana 300mg  1 tablet daily - checking BMET today 2.  Reviewed HBG goals:  Fasting  80-130 and 1-2 hour post prandial <180.  Patient is instructed to check BG 3 to 5  times per day.   Really stressed that she needs to check BG at least prior to each meal! 3.  BP goal < 140/80. 4.  Dietary recommendations:  Continue to limit serving sizes of high CHO foods and reduce caloric intake by increased fresh vegetables 5.  Return to clinic in 4 - 6 wks   Time spent counseling patient:  30 minutes   Cherre Robins, PharmD, CPP

## 2013-12-02 ENCOUNTER — Telehealth: Payer: Self-pay | Admitting: Family Medicine

## 2013-12-03 ENCOUNTER — Other Ambulatory Visit: Payer: Self-pay | Admitting: Family Medicine

## 2013-12-03 MED ORDER — OSELTAMIVIR PHOSPHATE 75 MG PO CAPS: 75.0000 mg | ORAL_CAPSULE | Freq: Every day | ORAL | Status: DC

## 2013-12-03 MED ORDER — ALPRAZOLAM 0.5 MG PO TABS: 0.5000 mg | ORAL_TABLET | Freq: Three times a day (TID) | ORAL | Status: DC | PRN

## 2013-12-03 NOTE — Telephone Encounter (Signed)
Patient reports that she is feeling better now.

## 2013-12-03 NOTE — Telephone Encounter (Signed)
Patient has been out since yesterday.  If approved please route to nurse to call in.

## 2013-12-03 NOTE — Telephone Encounter (Signed)
This is okay.

## 2013-12-03 NOTE — Addendum Note (Signed)
Addended by: Zannie Cove on: 12/03/2013 05:13 PM   Modules accepted: Orders

## 2013-12-04 NOTE — Telephone Encounter (Signed)
Authorization was phoned in on 12/03/13.

## 2013-12-21 ENCOUNTER — Telehealth: Payer: Self-pay | Admitting: Family Medicine

## 2013-12-21 MED ORDER — INSULIN LISPRO 100 UNIT/ML ~~LOC~~ SOLN: SUBCUTANEOUS | Status: DC

## 2013-12-21 NOTE — Telephone Encounter (Signed)
#  2 bottle of humalog left in lab refrig.  Patient notified.

## 2013-12-23 ENCOUNTER — Other Ambulatory Visit: Payer: Self-pay | Admitting: *Deleted

## 2013-12-23 MED ORDER — POTASSIUM CHLORIDE CRYS ER 20 MEQ PO TBCR
20.0000 meq | EXTENDED_RELEASE_TABLET | Freq: Every day | ORAL | Status: DC
Start: 2013-12-23 — End: 2014-10-25

## 2013-12-30 ENCOUNTER — Observation Stay (HOSPITAL_COMMUNITY)
Admission: EM | Admit: 2013-12-30 | Discharge: 2013-12-31 | Disposition: A | Discharge status: Home / Self Care | Payer: Medicare HMO | Attending: Internal Medicine | Admitting: Internal Medicine

## 2013-12-30 ENCOUNTER — Emergency Department (HOSPITAL_COMMUNITY): Payer: Medicare HMO

## 2013-12-30 ENCOUNTER — Encounter (HOSPITAL_COMMUNITY): Payer: Self-pay | Admitting: Emergency Medicine

## 2013-12-30 DIAGNOSIS — Z9861 Coronary angioplasty status: Secondary | ICD-10-CM | POA: Insufficient documentation

## 2013-12-30 DIAGNOSIS — IMO0002 Reserved for concepts with insufficient information to code with codable children: Secondary | ICD-10-CM

## 2013-12-30 DIAGNOSIS — Z7902 Long term (current) use of antithrombotics/antiplatelets: Secondary | ICD-10-CM | POA: Insufficient documentation

## 2013-12-30 DIAGNOSIS — E785 Hyperlipidemia, unspecified: Secondary | ICD-10-CM | POA: Insufficient documentation

## 2013-12-30 DIAGNOSIS — I2510 Atherosclerotic heart disease of native coronary artery without angina pectoris: Secondary | ICD-10-CM | POA: Insufficient documentation

## 2013-12-30 DIAGNOSIS — I471 Supraventricular tachycardia, unspecified: Secondary | ICD-10-CM | POA: Insufficient documentation

## 2013-12-30 DIAGNOSIS — I739 Peripheral vascular disease, unspecified: Secondary | ICD-10-CM | POA: Insufficient documentation

## 2013-12-30 DIAGNOSIS — I10 Essential (primary) hypertension: Secondary | ICD-10-CM

## 2013-12-30 DIAGNOSIS — R0789 Other chest pain: Principal | ICD-10-CM | POA: Insufficient documentation

## 2013-12-30 DIAGNOSIS — E1165 Type 2 diabetes mellitus with hyperglycemia: Secondary | ICD-10-CM

## 2013-12-30 DIAGNOSIS — F411 Generalized anxiety disorder: Secondary | ICD-10-CM | POA: Insufficient documentation

## 2013-12-30 DIAGNOSIS — Z8673 Personal history of transient ischemic attack (TIA), and cerebral infarction without residual deficits: Secondary | ICD-10-CM | POA: Insufficient documentation

## 2013-12-30 DIAGNOSIS — R079 Chest pain, unspecified: Secondary | ICD-10-CM | POA: Diagnosis present

## 2013-12-30 DIAGNOSIS — I6529 Occlusion and stenosis of unspecified carotid artery: Secondary | ICD-10-CM | POA: Insufficient documentation

## 2013-12-30 DIAGNOSIS — Z9641 Presence of insulin pump (external) (internal): Secondary | ICD-10-CM | POA: Insufficient documentation

## 2013-12-30 DIAGNOSIS — K219 Gastro-esophageal reflux disease without esophagitis: Secondary | ICD-10-CM

## 2013-12-30 DIAGNOSIS — Z794 Long term (current) use of insulin: Secondary | ICD-10-CM | POA: Insufficient documentation

## 2013-12-30 DIAGNOSIS — F3289 Other specified depressive episodes: Secondary | ICD-10-CM | POA: Insufficient documentation

## 2013-12-30 DIAGNOSIS — E119 Type 2 diabetes mellitus without complications: Secondary | ICD-10-CM | POA: Insufficient documentation

## 2013-12-30 DIAGNOSIS — F329 Major depressive disorder, single episode, unspecified: Secondary | ICD-10-CM | POA: Insufficient documentation

## 2013-12-30 DIAGNOSIS — I252 Old myocardial infarction: Secondary | ICD-10-CM | POA: Insufficient documentation

## 2013-12-30 DIAGNOSIS — K5900 Constipation, unspecified: Secondary | ICD-10-CM

## 2013-12-30 DIAGNOSIS — Z7982 Long term (current) use of aspirin: Secondary | ICD-10-CM | POA: Insufficient documentation

## 2013-12-30 LAB — CBC WITH DIFFERENTIAL/PLATELET
Basophils Absolute: 0 10*3/uL (ref 0.0–0.1)
Basophils Relative: 0 % (ref 0–1)
EOS ABS: 0.1 10*3/uL (ref 0.0–0.7)
EOS PCT: 2 % (ref 0–5)
HEMATOCRIT: 36.8 % (ref 36.0–46.0)
Hemoglobin: 12.7 g/dL (ref 12.0–15.0)
LYMPHS ABS: 2.1 10*3/uL (ref 0.7–4.0)
Lymphocytes Relative: 37 % (ref 12–46)
MCH: 29.5 pg (ref 26.0–34.0)
MCHC: 34.5 g/dL (ref 30.0–36.0)
MCV: 85.6 fL (ref 78.0–100.0)
MONO ABS: 0.4 10*3/uL (ref 0.1–1.0)
MONOS PCT: 6 % (ref 3–12)
Neutro Abs: 3.1 10*3/uL (ref 1.7–7.7)
Neutrophils Relative %: 54 % (ref 43–77)
PLATELETS: 156 10*3/uL (ref 150–400)
RBC: 4.3 MIL/uL (ref 3.87–5.11)
RDW: 13 % (ref 11.5–15.5)
WBC: 5.7 10*3/uL (ref 4.0–10.5)

## 2013-12-30 LAB — COMPREHENSIVE METABOLIC PANEL
ALT: 22 U/L (ref 0–35)
AST: 24 U/L (ref 0–37)
Albumin: 3.7 g/dL (ref 3.5–5.2)
Alkaline Phosphatase: 57 U/L (ref 39–117)
BUN: 13 mg/dL (ref 6–23)
CALCIUM: 9.4 mg/dL (ref 8.4–10.5)
CO2: 28 meq/L (ref 19–32)
CREATININE: 1.06 mg/dL (ref 0.50–1.10)
Chloride: 101 mEq/L (ref 96–112)
GFR, EST AFRICAN AMERICAN: 62 mL/min — AB (ref >90)
GFR, EST NON AFRICAN AMERICAN: 54 mL/min — AB (ref >90)
GLUCOSE: 226 mg/dL — AB (ref 70–99)
Potassium: 4.2 mEq/L (ref 3.7–5.3)
SODIUM: 140 meq/L (ref 137–147)
Total Bilirubin: 0.5 mg/dL (ref 0.3–1.2)
Total Protein: 7.8 g/dL (ref 6.0–8.3)

## 2013-12-30 LAB — TROPONIN I: Troponin I: <0.3 ng/mL (ref <0.30)

## 2013-12-30 LAB — GLUCOSE, CAPILLARY: Glucose-Capillary: 149 mg/dL — ABNORMAL HIGH (ref 70–99)

## 2013-12-30 LAB — LIPID PANEL
CHOL/HDL RATIO: 3.4 ratio
CHOLESTEROL: 134 mg/dL (ref 0–200)
HDL: 40 mg/dL (ref >39)
LDL CALC: 66 mg/dL (ref 0–99)
Triglycerides: 138 mg/dL (ref <150)
VLDL: 28 mg/dL (ref 0–40)

## 2013-12-30 LAB — LIPASE, BLOOD: Lipase: 23 U/L (ref 11–59)

## 2013-12-30 MED ORDER — MORPHINE SULFATE 2 MG/ML IJ SOLN: 1.0000 mg | INTRAMUSCULAR | Status: DC | PRN

## 2013-12-30 MED ORDER — VITAMIN D (ERGOCALCIFEROL) 1.25 MG (50000 UNIT) PO CAPS
50000.0000 [IU] | ORAL_CAPSULE | ORAL | Status: DC
  Filled 2013-12-30: qty 1

## 2013-12-30 MED ORDER — NITROGLYCERIN 0.4 MG SL SUBL: 0.4000 mg | SUBLINGUAL_TABLET | SUBLINGUAL | Status: DC | PRN

## 2013-12-30 MED ORDER — GABAPENTIN 300 MG PO CAPS: 300.0000 mg | ORAL_CAPSULE | Freq: Every day | ORAL | Status: DC | PRN

## 2013-12-30 MED ORDER — NITROGLYCERIN IN D5W 200-5 MCG/ML-% IV SOLN
5.0000 ug/min | Freq: Once | INTRAVENOUS | Status: AC
  Administered 2013-12-30: 5 ug/min via INTRAVENOUS
  Filled 2013-12-30: qty 250

## 2013-12-30 MED ORDER — ZOLPIDEM TARTRATE 5 MG PO TABS
5.0000 mg | ORAL_TABLET | Freq: Every evening | ORAL | Status: DC | PRN
  Administered 2013-12-30: 5 mg via ORAL
  Filled 2013-12-30: qty 1

## 2013-12-30 MED ORDER — POTASSIUM CHLORIDE CRYS ER 20 MEQ PO TBCR
20.0000 meq | EXTENDED_RELEASE_TABLET | Freq: Every day | ORAL | Status: DC
  Administered 2013-12-31: 20 meq via ORAL
  Filled 2013-12-30: qty 1

## 2013-12-30 MED ORDER — CITALOPRAM HYDROBROMIDE 20 MG PO TABS
20.0000 mg | ORAL_TABLET | Freq: Every day | ORAL | Status: DC
  Administered 2013-12-31: 20 mg via ORAL
  Filled 2013-12-30: qty 1

## 2013-12-30 MED ORDER — ASPIRIN 325 MG PO TABS
325.0000 mg | ORAL_TABLET | Freq: Every day | ORAL | Status: DC
  Administered 2013-12-31: 325 mg via ORAL
  Filled 2013-12-30: qty 1

## 2013-12-30 MED ORDER — SODIUM CHLORIDE 0.9 % IJ SOLN
3.0000 mL | Freq: Two times a day (BID) | INTRAMUSCULAR | Status: DC
  Administered 2013-12-30 – 2013-12-31 (×2): 3 mL via INTRAVENOUS

## 2013-12-30 MED ORDER — PANTOPRAZOLE SODIUM 40 MG PO TBEC
80.0000 mg | DELAYED_RELEASE_TABLET | Freq: Every day | ORAL | Status: DC
  Administered 2013-12-31: 80 mg via ORAL
  Filled 2013-12-30: qty 2

## 2013-12-30 MED ORDER — INSULIN PUMP
Freq: Three times a day (TID) | SUBCUTANEOUS | Status: DC
  Administered 2013-12-30: 13 via SUBCUTANEOUS
  Administered 2013-12-30: 25 via SUBCUTANEOUS
  Administered 2013-12-31: 13 via SUBCUTANEOUS
  Administered 2013-12-31: 12 via SUBCUTANEOUS
  Administered 2013-12-31: 8 via SUBCUTANEOUS
  Filled 2013-12-30: qty 1

## 2013-12-30 MED ORDER — NITROGLYCERIN 2 % TD OINT: 1.0000 [in_us] | TOPICAL_OINTMENT | Freq: Once | TRANSDERMAL | Status: DC

## 2013-12-30 MED ORDER — SODIUM CHLORIDE 0.9 % IV SOLN: INTRAVENOUS | Status: DC

## 2013-12-30 MED ORDER — CLOPIDOGREL BISULFATE 75 MG PO TABS
75.0000 mg | ORAL_TABLET | Freq: Every day | ORAL | Status: DC
  Administered 2013-12-31: 75 mg via ORAL
  Filled 2013-12-30 (×2): qty 1

## 2013-12-30 MED ORDER — LISINOPRIL 10 MG PO TABS
20.0000 mg | ORAL_TABLET | Freq: Two times a day (BID) | ORAL | Status: DC
  Administered 2013-12-30: 20 mg via ORAL
  Filled 2013-12-30 (×2): qty 4

## 2013-12-30 MED ORDER — MORPHINE SULFATE 4 MG/ML IJ SOLN: 4.0000 mg | INTRAMUSCULAR | Status: DC | PRN

## 2013-12-30 MED ORDER — ALPRAZOLAM 0.5 MG PO TABS
0.5000 mg | ORAL_TABLET | Freq: Three times a day (TID) | ORAL | Status: DC | PRN
  Administered 2013-12-30: 0.5 mg via ORAL
  Filled 2013-12-30: qty 1

## 2013-12-30 MED ORDER — ATORVASTATIN CALCIUM 40 MG PO TABS
80.0000 mg | ORAL_TABLET | Freq: Every day | ORAL | Status: DC
  Administered 2013-12-30: 80 mg via ORAL
  Filled 2013-12-30: qty 2

## 2013-12-30 MED ORDER — ATENOLOL 25 MG PO TABS
50.0000 mg | ORAL_TABLET | Freq: Two times a day (BID) | ORAL | Status: DC
  Administered 2013-12-30 – 2013-12-31 (×2): 50 mg via ORAL
  Filled 2013-12-30 (×2): qty 2

## 2013-12-30 MED ORDER — ISOSORBIDE MONONITRATE ER 60 MG PO TB24
120.0000 mg | ORAL_TABLET | Freq: Every day | ORAL | Status: DC
  Administered 2013-12-31: 120 mg via ORAL
  Filled 2013-12-30: qty 2

## 2013-12-30 MED ORDER — EZETIMIBE 10 MG PO TABS
10.0000 mg | ORAL_TABLET | Freq: Every day | ORAL | Status: DC
  Administered 2013-12-31: 10 mg via ORAL
  Filled 2013-12-30: qty 1

## 2013-12-30 MED ORDER — ENOXAPARIN SODIUM 40 MG/0.4ML ~~LOC~~ SOLN
40.0000 mg | SUBCUTANEOUS | Status: DC
  Administered 2013-12-30: 40 mg via SUBCUTANEOUS
  Filled 2013-12-30: qty 0.4

## 2013-12-30 NOTE — H&P (Signed)
[facility] History and Physical  [name] VFI:[id number] DOB: 30-May-1951 DOA: 12/30/2013  Referring physician: ED physician PCP: Redge Gainer, MD   Chief Complaint: chest pain   HPI:  Pt is 63 yo female with diabetes, HTN, HLD presented to [facility] ED with main concern of constant and pressure - like substernal chest pain, started earlier this AM prior to this admission, radiating to the left arm, 10/10 in severity, Pt denies similar events in the past and explains she called EMS after noting that nitroglycerin has not helped with symptoms. She denies shortness of breath, no abdominal or urinary concerns. No specific focal neurological symptoms.   In ED, pt is hemodynamically stable, [facility] asked to admit for chest pain work up. ACS rule out.   Assessment and Plan: Active Problems:   Chest pain - unclear etiology but will admit to telemetry bed for further evaluation - will start with repeating EKG, cycle CE's - provide supportive care with analgesia as needed, nitroglycerin as needed - provide oxygen as needed as well - check lipid panel and A1C   Diabetes mellitus - check A1C and place on insulin pump as per home medical regimen    HLD - check lipid panel - continue statin    HTN - continue home medical regimen   Radiological Exams on Admission: Dg Chest Port 1 View  12/30/2013  No acute disease.    Code Status: Full Family Communication: Pt at bedside Disposition Plan: Admit for further evaluation     Review of Systems:  Constitutional: Negative for fever, chills and malaise/fatigue. Negative for diaphoresis.  HENT: Negative for hearing loss, ear pain, nosebleeds, congestion, sore throat, neck pain, tinnitus and ear discharge.   Eyes: Negative for blurred vision, double vision, photophobia, pain, discharge and redness.  Respiratory: Negative for cough, hemoptysis, sputum production, shortness of breath, wheezing and stridor.   Cardiovascular: Negative for claudication and  leg swelling.  Gastrointestinal: Negative for nausea, vomiting and abdominal pain. Negative for heartburn, constipation, blood in stool and melena.  Genitourinary: Negative for dysuria, urgency, frequency, hematuria and flank pain.  Musculoskeletal: Negative for myalgias, back pain, joint pain and falls.  Skin: Negative for itching and rash.  Neurological: Negative for tingling, tremors, sensory change, speech change, focal weakness, loss of consciousness and headaches.  Endo/Heme/Allergies: Negative for environmental allergies and polydipsia. Does not bruise/bleed easily.  Psychiatric/Behavioral: Negative for suicidal ideas. The patient is not nervous/anxious.      Past Medical History  Diagnosis Date  . Diverticulitis, colon   . MS (multiple sclerosis)     Not confirmed  . Depression   . PVD (peripheral vascular disease)   . HLD (hyperlipidemia)   . Essential hypertension, benign   . CAD (coronary artery disease)     DES to circumflex 02/2007, BMS to LAD and PTCA diagonal 03/2007  . Carotid artery plaque     Mild  . IDDM (insulin dependent diabetes mellitus)   . GERD (gastroesophageal reflux disease)   . NSTEMI (non-ST elevated myocardial infarction)     02/2007  . Prolapse of uterus   . TIA (transient ischemic attack)   . PAT (paroxysmal atrial tachycardia)   . Hemiplegic migraine   . Anxiety   . History of pneumonia     Past Surgical History  Procedure Laterality Date  . Appendectomy    . Cholecystectomy    . Partial hysterectomy  1986    ovaries remain - prolaspe uterus   . Breast lumpectomy  Dr. Charlynne Pander   . Colonoscopy  2002    Dr. Anwar--> Severe diverticular changes in the region of the sigmoid and descending colon with scattered diverticular changes throughout the rest of the colon. No polyps, ulcerations. Despite numerous manipulations, the tip of the scope could not be tipped into the cecal area.  . Colonoscopy  01/10/2012    Procedure: COLONOSCOPY;  Surgeon:  Daneil Dolin, MD;  Location: [facility];  Service: Endoscopy;  Laterality: N/A;  1:55    Social History:  reports that she has never smoked. She does not have any smokeless tobacco history on file. She reports that she does not drink alcohol or use illicit drugs.  Allergies  Allergen Reactions  . Codeine Nausea And Vomiting  . Iohexol      Desc: pt had syncopal episode with nausea post IV CM late 1990's,  pt has had prednisone prep with heart caths x 2 without problem  kdean 04/16/07, Onset Date: [id number]   . Ticlid [Ticlopidine Hcl] Nausea And Vomiting    Family History  Problem Relation Age of Onset  . Heart attack Mother 54  . Diabetes Father   . Heart attack Father 18  . Heart attack Brother 30    x6  . Colon cancer Paternal Aunt     1s, died with brain anuerysm  . Crohn's disease Cousin     paternal    Prior to Admission medications   Medication Sig Start Date End Date Taking? Authorizing Provider  ALPRAZolam Duanne Moron) 0.5 MG tablet Take 1 tablet (0.5 mg total) by mouth 3 (three) times daily as needed. For anxiety 12/03/13  Yes Chipper Herb, MD  aspirin 325 MG tablet Take 325 mg by mouth daily.     Yes Historical Provider, MD  atenolol (TENORMIN) 50 MG tablet Take 1 tablet (50 mg total) by mouth 2 (two) times daily. 10/29/13  Yes Chipper Herb, MD  Canagliflozin 300 MG TABS Take 1 tablet (300 mg total) by mouth daily. 10/21/13  Yes Tammy Eckard, PHARMD  citalopram (CELEXA) 20 MG tablet Take 1 tablet (20 mg total) by mouth daily. 04/20/13  Yes Chipper Herb, MD  clopidogrel (PLAVIX) 75 MG tablet Take 1 tablet (75 mg total) by mouth daily. 10/29/13  Yes Chipper Herb, MD  esomeprazole (NEXIUM) 40 MG capsule Take 1 capsule (40 mg total) by mouth daily as needed. Indigestion 06/11/13  Yes Chipper Herb, MD  ezetimibe (ZETIA) 10 MG tablet Take 1 tablet (10 mg total) by mouth daily. 06/11/13  Yes Chipper Herb, MD  furosemide (LASIX) 40 MG tablet Take 1 tablet (40 mg total) by  mouth 2 (two) times daily. 10/29/13  Yes Chipper Herb, MD  gabapentin (NEURONTIN) 300 MG capsule Take 1 capsule (300 mg total) by mouth daily as needed. Leg Pain 06/11/13  Yes Chipper Herb, MD  insulin lispro (HUMALOG) 100 UNIT/ML injection Insulin used in pump - average daily insulin amount is 100 units. 12/21/13  Yes Tammy Eckard, PHARMD  isosorbide mononitrate (IMDUR) 120 MG 24 hr tablet Take 1 tablet (120 mg total) by mouth daily. 06/11/13  Yes Chipper Herb, MD  lisinopril (PRINIVIL,ZESTRIL) 20 MG tablet Take 1 tablet (20 mg total) by mouth 2 (two) times daily. 06/11/13  Yes Chipper Herb, MD  nitroGLYCERIN (NITROSTAT) 0.4 MG SL tablet Place 1 tablet (0.4 mg total) under the tongue every 5 (five) minutes as needed. For chest pain 06/11/13  Yes Estella Husk  Laurance Flatten, MD  potassium chloride SA (K-DUR,KLOR-CON) 20 MEQ tablet Take 1 tablet (20 mEq total) by mouth daily. 12/23/13  Yes Chipper Herb, MD  rosuvastatin (CRESTOR) 40 MG tablet Take 1 tablet (40 mg total) by mouth daily. 06/11/13  Yes Chipper Herb, MD  Vitamin D, Ergocalciferol, (DRISDOL) 50000 UNITS CAPS capsule Take 1 capsule (50,000 Units total) by mouth every 7 (seven) days. Friday 06/11/13  Yes Chipper Herb, MD  zolpidem Lorrin Mais) 10 MG tablet For sleep,1/2 to 1 prn at bedtime 06/11/13  Yes Chipper Herb, MD    Physical Exam: Filed Vitals:   12/30/13 1312 12/30/13 1316 12/30/13 1425  BP:  137/77 133/71  Pulse:  77 74  Temp:  97.9 F (36.6 C)   Resp:  18 14  Height: 5\' 5"  (1.651 m)    Weight: 78.926 kg (174 lb)    SpO2:  96% 98%    Physical Exam  Constitutional: Appears well-developed and well-nourished. No distress.  HENT: Normocephalic. External right and left ear normal. Oropharynx is clear and moist.  Eyes: Conjunctivae and EOM are normal. PERRLA, no scleral icterus.  Neck: Normal ROM. Neck supple. No JVD. No tracheal deviation. No thyromegaly.  CVS: RRR, S1/S2 +, no murmurs, no gallops, no carotid bruit.  Pulmonary: Effort  and breath sounds normal, no stridor, rhonchi, wheezes, rales.  Abdominal: Soft. BS +,  no distension, tenderness, rebound or guarding.  Musculoskeletal: Normal range of motion. No edema and no tenderness.  Lymphadenopathy: No lymphadenopathy noted, cervical, inguinal. Neuro: Alert. Normal reflexes, muscle tone coordination. No cranial nerve deficit. Skin: Skin is warm and dry. No rash noted. Not diaphoretic. No erythema. No pallor.  Psychiatric: Normal mood and affect. Behavior, judgment, thought content normal.   Labs on Admission:  Basic Metabolic Panel:  Recent Labs Lab 12/30/13 1349  NA 140  K 4.2  CL 101  CO2 28  GLUCOSE 226*  BUN 13  CREATININE 1.06  CALCIUM 9.4   Liver Function Tests:  Recent Labs Lab 12/30/13 1349  AST 24  ALT 22  ALKPHOS 57  BILITOT 0.5  PROT 7.8  ALBUMIN 3.7    Recent Labs Lab 12/30/13 1349  LIPASE 23   CBC:  Recent Labs Lab 12/30/13 1349  WBC 5.7  NEUTROABS 3.1  HGB 12.7  HCT 36.8  MCV 85.6  PLT 156   Cardiac Enzymes:  Recent Labs Lab 12/30/13 1349  TROPONINI <0.30   EKG: Normal sinus rhythm, no ST/T wave changes  Theodis Blaze, MD  [facility] Pager [phone]  If 7PM-7AM, please contact night-coverage [email or web address] Password [email or web address] 12/30/2013, 3:24 PM

## 2013-12-30 NOTE — ED Notes (Signed)
Chest pain onset this 6am this am, brought in via EMS, given aspirin and nitro prior to arrival

## 2013-12-30 NOTE — ED Provider Notes (Signed)
CSN: [id number]     Arrival date & time 12/30/13  1305 History   First MD Initiated Contact with Patient 12/30/13 1324     Chief Complaint  Patient presents with  . Chest Pain     HPI Pt was seen at 1330. Per pt, c/o gradual onset and persistence of constant chest "pain" since this morning approx 0600. Pt describes the CP as "heaviness" and "pressure," as well as "like my heart pain." Has been associated with radiation into her left arm, SOB and nausea. Pt states she took her usual meds this morning, including SL ntg, without relief. Denies palpitations, no cough, no back pain, no abd pain, no vomiting/diarrhea, no fevers, no rash.    Past Medical History  Diagnosis Date  . Diverticulitis, colon   . MS (multiple sclerosis)     Not confirmed  . Depression   . PVD (peripheral vascular disease)   . HLD (hyperlipidemia)   . Essential hypertension, benign   . CAD (coronary artery disease)     DES to circumflex 02/2007, BMS to LAD and PTCA diagonal 03/2007  . Carotid artery plaque     Mild  . IDDM (insulin dependent diabetes mellitus)   . GERD (gastroesophageal reflux disease)   . NSTEMI (non-ST elevated myocardial infarction)     02/2007  . Prolapse of uterus   . TIA (transient ischemic attack)   . PAT (paroxysmal atrial tachycardia)   . Hemiplegic migraine   . Anxiety   . History of pneumonia    Past Surgical History  Procedure Laterality Date  . Appendectomy    . Cholecystectomy    . Partial hysterectomy  1986    ovaries remain - prolaspe uterus   . Breast lumpectomy      Dr. Charlynne Pander   . Colonoscopy  2002    Dr. Anwar--> Severe diverticular changes in the region of the sigmoid and descending colon with scattered diverticular changes throughout the rest of the colon. No polyps, ulcerations. Despite numerous manipulations, the tip of the scope could not be tipped into the cecal area.  . Colonoscopy  01/10/2012    Procedure: COLONOSCOPY;  Surgeon: Daneil Dolin, MD;  Location:  [facility];  Service: Endoscopy;  Laterality: N/A;  1:55   Family History  Problem Relation Age of Onset  . Heart attack Mother 71  . Diabetes Father   . Heart attack Father 24  . Heart attack Brother 30    x6  . Colon cancer Paternal Aunt     6s, died with brain anuerysm  . Crohn's disease Cousin     paternal   History  Substance Use Topics  . Smoking status: Never Smoker   . Smokeless tobacco: Not on file     Comment: spouse, 45 years - husband has quit 01/2011  . Alcohol Use: No    Review of Systems ROS: Statement: All systems negative except as marked or noted in the HPI; Constitutional: Negative for fever and chills. ; ; Eyes: Negative for eye pain, redness and discharge. ; ; ENMT: Negative for ear pain, hoarseness, nasal congestion, sinus pressure and sore throat. ; ; Cardiovascular: +CP, SOB. Negative for palpitations, diaphoresis, and peripheral edema. ; ; Respiratory: Negative for cough, wheezing and stridor. ; ; Gastrointestinal: +nausea. Negative for vomiting, diarrhea, abdominal pain, blood in stool, hematemesis, jaundice and rectal bleeding. . ; ; Genitourinary: Negative for dysuria, flank pain and hematuria. ; ; Musculoskeletal: Negative for back pain and neck pain.  Negative for swelling and trauma.; ; Skin: Negative for pruritus, rash, abrasions, blisters, bruising and skin lesion.; ; Neuro: +lightheadedness. Negative for headache and neck stiffness. Negative for weakness, altered level of consciousness , altered mental status, extremity weakness, paresthesias, involuntary movement, seizure and syncope.        Allergies  Codeine; Iohexol; and Ticlid  Home Medications   Current Outpatient Rx  Name  Route  Sig  Dispense  Refill  . ALPRAZolam (XANAX) 0.5 MG tablet   Oral   Take 1 tablet (0.5 mg total) by mouth 3 (three) times daily as needed. For anxiety   90 tablet   1   . aspirin 325 MG tablet   Oral   Take 325 mg by mouth daily.           Marland Kitchen atenolol  (TENORMIN) 50 MG tablet   Oral   Take 1 tablet (50 mg total) by mouth 2 (two) times daily.   180 tablet   1   . Canagliflozin 300 MG TABS   Oral   Take 1 tablet (300 mg total) by mouth daily.   30 tablet   2     Please profile until patient calls - has some samp ...   . citalopram (CELEXA) 20 MG tablet   Oral   Take 1 tablet (20 mg total) by mouth daily.   30 tablet   3   . clopidogrel (PLAVIX) 75 MG tablet   Oral   Take 1 tablet (75 mg total) by mouth daily.   90 tablet   1   . esomeprazole (NEXIUM) 40 MG capsule   Oral   Take 1 capsule (40 mg total) by mouth daily as needed. Indigestion   90 capsule   4   . ezetimibe (ZETIA) 10 MG tablet   Oral   Take 1 tablet (10 mg total) by mouth daily.   90 tablet   4   . furosemide (LASIX) 40 MG tablet   Oral   Take 1 tablet (40 mg total) by mouth 2 (two) times daily.   180 tablet   1   . gabapentin (NEURONTIN) 300 MG capsule   Oral   Take 1 capsule (300 mg total) by mouth daily as needed. Leg Pain   90 capsule   4   . insulin lispro (HUMALOG) 100 UNIT/ML injection      Insulin used in pump - average daily insulin amount is 100 units.   20 mL   0   . isosorbide mononitrate (IMDUR) 120 MG 24 hr tablet   Oral   Take 1 tablet (120 mg total) by mouth daily.   30 tablet   11   . lisinopril (PRINIVIL,ZESTRIL) 20 MG tablet   Oral   Take 1 tablet (20 mg total) by mouth 2 (two) times daily.   90 tablet   4   . nitroGLYCERIN (NITROSTAT) 0.4 MG SL tablet   Sublingual   Place 1 tablet (0.4 mg total) under the tongue every 5 (five) minutes as needed. For chest pain   30 tablet   11   . potassium chloride SA (K-DUR,KLOR-CON) 20 MEQ tablet   Oral   Take 1 tablet (20 mEq total) by mouth daily.   90 tablet   1   . rosuvastatin (CRESTOR) 40 MG tablet   Oral   Take 1 tablet (40 mg total) by mouth daily.   90 tablet   4   . Vitamin D, Ergocalciferol, (DRISDOL) 50000  UNITS CAPS capsule   Oral   Take 1 capsule  (50,000 Units total) by mouth every 7 (seven) days. Friday   12 capsule   4   . zolpidem (AMBIEN) 10 MG tablet      For sleep,1/2 to 1 prn at bedtime   90 tablet   1    BP 133/71  Pulse 74  Temp(Src) 97.9 F (36.6 C)  Resp 14  Ht 5\' 5"  (1.651 m)  Wt 174 lb (78.926 kg)  BMI 28.96 kg/m2  SpO2 98% Physical Exam 1335: Physical examination:  Nursing notes reviewed; Vital signs and O2 SAT reviewed;  Constitutional: Well developed, Well nourished, Well hydrated, In no acute distress; Head:  Normocephalic, atraumatic; Eyes: EOMI, PERRL, No scleral icterus; ENMT: Mouth and pharynx normal, Mucous membranes moist; Neck: Supple, Full range of motion, No lymphadenopathy; Cardiovascular: Regular rate and rhythm, No gallop; Respiratory: Breath sounds clear & equal bilaterally, No rales, rhonchi, wheezes.  Speaking full sentences with ease, Normal respiratory effort/excursion; Chest: Nontender, Movement normal; Abdomen: Soft, Nontender, Nondistended, Normal bowel sounds; Genitourinary: No CVA tenderness; Extremities: Pulses normal, No tenderness, No edema, No calf edema or asymmetry.; Neuro: AA&Ox3, Major CN grossly intact.  Speech clear. No gross focal motor or sensory deficits in extremities.; Skin: Color normal, Warm, Dry.   ED Course  Procedures     EKG Interpretation   Date/Time:  Wednesday December 30 2013 13:15:03 EDT Ventricular Rate:  80 PR Interval:  200 QRS Duration: 82 QT Interval:  390 QTC Calculation: 449 R Axis:   -46 Text Interpretation:  Normal sinus rhythm Left axis deviation Inferior  infarct (cited on or before 11-Jan-2010) Anterior infarct , age  undetermined Abnormal ECG When compared with ECG of 24-Aug-2011 05:11,  Nonspecific T wave abnormality now evident in Anterior leads Confirmed by  [facility]  MD, Nunzio Cory [phone]) on 12/30/2013 2:58:18 PM      MDM  MDM Reviewed: previous chart, nursing note and vitals Reviewed previous: labs and ECG Interpretation: labs, ECG  and x-ray    Results for orders placed during the hospital encounter of 12/30/13  CBC WITH DIFFERENTIAL      Result Value Ref Range   WBC 5.7  4.0 - 10.5 K/uL   RBC 4.30  3.87 - 5.11 MIL/uL   Hemoglobin 12.7  12.0 - 15.0 g/dL   HCT 36.8  36.0 - 46.0 %   MCV 85.6  78.0 - 100.0 fL   MCH 29.5  26.0 - 34.0 pg   MCHC 34.5  30.0 - 36.0 g/dL   RDW 13.0  11.5 - 15.5 %   Platelets 156  150 - 400 K/uL   Neutrophils Relative % 54  43 - 77 %   Neutro Abs 3.1  1.7 - 7.7 K/uL   Lymphocytes Relative 37  12 - 46 %   Lymphs Abs 2.1  0.7 - 4.0 K/uL   Monocytes Relative 6  3 - 12 %   Monocytes Absolute 0.4  0.1 - 1.0 K/uL   Eosinophils Relative 2  0 - 5 %   Eosinophils Absolute 0.1  0.0 - 0.7 K/uL   Basophils Relative 0  0 - 1 %   Basophils Absolute 0.0  0.0 - 0.1 K/uL  COMPREHENSIVE METABOLIC PANEL      Result Value Ref Range   Sodium 140  137 - 147 mEq/L   Potassium 4.2  3.7 - 5.3 mEq/L   Chloride 101  96 - 112 mEq/L   CO2 28  19 - 32 mEq/L   Glucose, Bld 226 (*) 70 - 99 mg/dL   BUN 13  6 - 23 mg/dL   Creatinine, Ser 1.06  0.50 - 1.10 mg/dL   Calcium 9.4  8.4 - 10.5 mg/dL   Total Protein 7.8  6.0 - 8.3 g/dL   Albumin 3.7  3.5 - 5.2 g/dL   AST 24  0 - 37 U/L   ALT 22  0 - 35 U/L   Alkaline Phosphatase 57  39 - 117 U/L   Total Bilirubin 0.5  0.3 - 1.2 mg/dL   GFR calc non Af Amer 54 (*) >90 mL/min   GFR calc Af Amer 62 (*) >90 mL/min  LIPASE, BLOOD      Result Value Ref Range   Lipase 23  11 - 59 U/L  TROPONIN I      Result Value Ref Range   Troponin I <0.30  <0.30 ng/mL   Dg Chest Port 1 View 12/30/2013   CLINICAL DATA:  Chest pain and nausea.  EXAM: PORTABLE CHEST - 1 VIEW  COMPARISON:  Single view of the chest 04/29/2012 and 08/23/2011.  FINDINGS: The lungs are clear. Heart size is normal. No pneumothorax or pleural fluid.  IMPRESSION: No acute disease.   Electronically Signed   By: Inge Rise M.D.   On: 12/30/2013 14:07    1510:   ASA already given by EMS. IV ntg gtt and  IV morphine given with improvement in pt's symptoms. EKG with NS TWA, troponin negative. Pt with multiple risk factors for ACS; will admit. Dx and testing d/w pt and family.  Questions answered.  Verb understanding, agreeable to admit. T/C to [facility] Dr. Doyle Askew, case discussed, including:  HPI, pertinent PM/SHx, VS/PE, dx testing, ED course and treatment:  Agreeable to admit, requests to write temporary orders, obtain tele bed to team 1.     Alfonzo Feller, DO 01/01/14 1201

## 2013-12-31 DIAGNOSIS — I10 Essential (primary) hypertension: Secondary | ICD-10-CM

## 2013-12-31 DIAGNOSIS — IMO0001 Reserved for inherently not codable concepts without codable children: Secondary | ICD-10-CM

## 2013-12-31 DIAGNOSIS — K219 Gastro-esophageal reflux disease without esophagitis: Secondary | ICD-10-CM

## 2013-12-31 DIAGNOSIS — F411 Generalized anxiety disorder: Secondary | ICD-10-CM

## 2013-12-31 DIAGNOSIS — E1165 Type 2 diabetes mellitus with hyperglycemia: Secondary | ICD-10-CM

## 2013-12-31 LAB — GLUCOSE, CAPILLARY
GLUCOSE-CAPILLARY: 211 mg/dL — AB (ref 70–99)
GLUCOSE-CAPILLARY: 194 mg/dL — AB (ref 70–99)
Glucose-Capillary: 192 mg/dL — ABNORMAL HIGH (ref 70–99)
Glucose-Capillary: 252 mg/dL — ABNORMAL HIGH (ref 70–99)
Glucose-Capillary: 345 mg/dL — ABNORMAL HIGH (ref 70–99)

## 2013-12-31 LAB — TROPONIN I
Troponin I: <0.3 ng/mL (ref <0.30)
Troponin I: <0.3 ng/mL (ref <0.30)

## 2013-12-31 LAB — HEMOGLOBIN A1C
Hgb A1c MFr Bld: 10 % — ABNORMAL HIGH (ref <5.7)
Mean Plasma Glucose: 240 mg/dL — ABNORMAL HIGH (ref <117)

## 2013-12-31 MED ORDER — ACETAMINOPHEN 325 MG PO TABS
650.0000 mg | ORAL_TABLET | Freq: Four times a day (QID) | ORAL | Status: DC | PRN
  Administered 2013-12-31: 650 mg via ORAL
  Filled 2013-12-31: qty 2

## 2013-12-31 MED ORDER — INSULIN PUMP: SUBCUTANEOUS | Status: DC

## 2013-12-31 NOTE — Progress Notes (Signed)
Patient has type 2 diabetes and uses a [employer] insulin pump for diabetes control.  Patient's PCP is Dr. Laurance Flatten and Cherre Robins, RPh has been following the patient closely and helping her make adjustments with her insulin pump. Patient last saw Tammy Eckard on 11/26/13.   The current insulin pump settings are as follows: Basal rate:  1.85 units/hr TDD of Basal insulin: 44.4 units/24 hours  ICR: 1:7 (one unit of insulin covers 7 grams of carbs) ISF: 1:10 (one unit of insulin drops blood sugar 10 mg/dl) Target Glucose: 110  All insulin pump settings match settings noted by Cherre Robins, RPh on 11/26/13.  Patient reports that she will need to change her infusion site and put in a new insulin reservoir today but she thinks she will likely be discharged and will change everything at home.  If patient is not discharged, she is going to have her husband bring her all her insulin pump supplies so that she can change infusion site and insulin reservoir out today.  Discussed A1C (10.0 on 12/30/13) with patient. Patient reports that 10.0% is an improvement for her she was running 12-14%.  She has been working closely with Cherre Robins, [place] and seeing her monthly to make adjustments with her insulin pump. Patient appears to very knowledgeable about diabetes and tries to "do the right things" to keep diabetes controlled.  Patient verbalized understanding of all information discussed and reports that she does not have any questions related to diabetes at this time.    Thanks, Barnie Alderman, RN, MSN, CCRN Diabetes Coordinator Inpatient Diabetes Program [phone] (Team Pager) [phone] ([facility] office) [phone] [facility] office)

## 2013-12-31 NOTE — Discharge Instructions (Signed)
Chest Pain (Nonspecific) °It is often hard to give a specific diagnosis for the cause of chest pain. There is always a chance that your pain could be related to something serious, such as a heart attack or a blood clot in the lungs. You need to follow up with your caregiver for further evaluation. °CAUSES  °· Heartburn. °· Pneumonia or bronchitis. °· Anxiety or stress. °· Inflammation around your heart (pericarditis) or lung (pleuritis or pleurisy). °· A blood clot in the lung. °· A collapsed lung (pneumothorax). It can develop suddenly on its own (spontaneous pneumothorax) or from injury (trauma) to the chest. °· Shingles infection (herpes zoster virus). °The chest wall is composed of bones, muscles, and cartilage. Any of these can be the source of the pain. °· The bones can be bruised by injury. °· The muscles or cartilage can be strained by coughing or overwork. °· The cartilage can be affected by inflammation and become sore (costochondritis). °DIAGNOSIS  °Lab tests or other studies, such as X-rays, electrocardiography, stress testing, or cardiac imaging, may be needed to find the cause of your pain.  °TREATMENT  °· Treatment depends on what may be causing your chest pain. Treatment may include: °· Acid blockers for heartburn. °· Anti-inflammatory medicine. °· Pain medicine for inflammatory conditions. °· Antibiotics if an infection is present. °· You may be advised to change lifestyle habits. This includes stopping smoking and avoiding alcohol, caffeine, and chocolate. °· You may be advised to keep your head raised (elevated) when sleeping. This reduces the chance of acid going backward from your stomach into your esophagus. °· Most of the time, nonspecific chest pain will improve within 2 to 3 days with rest and mild pain medicine. °HOME CARE INSTRUCTIONS  °· If antibiotics were prescribed, take your antibiotics as directed. Finish them even if you start to feel better. °· For the next few days, avoid physical  activities that bring on chest pain. Continue physical activities as directed. °· Do not smoke. °· Avoid drinking alcohol. °· Only take over-the-counter or prescription medicine for pain, discomfort, or fever as directed by your caregiver. °· Follow your caregiver's suggestions for further testing if your chest pain does not go away. °· Keep any follow-up appointments you made. If you do not go to an appointment, you could develop lasting (chronic) problems with pain. If there is any problem keeping an appointment, you must call to reschedule. °SEEK MEDICAL CARE IF:  °· You think you are having problems from the medicine you are taking. Read your medicine instructions carefully. °· Your chest pain does not go away, even after treatment. °· You develop a rash with blisters on your chest. °SEEK IMMEDIATE MEDICAL CARE IF:  °· You have increased chest pain or pain that spreads to your arm, neck, jaw, back, or abdomen. °· You develop shortness of breath, an increasing cough, or you are coughing up blood. °· You have severe back or abdominal pain, feel nauseous, or vomit. °· You develop severe weakness, fainting, or chills. °· You have a fever. °THIS IS AN EMERGENCY. Do not wait to see if the pain will go away. Get medical help at once. Call your local emergency services (911 in [place] Do not drive yourself to the hospital. °MAKE SURE YOU:  °· Understand these instructions. °· Will watch your condition. °· Will get help right away if you are not doing well or get worse. °Document Released: 06/20/2005 Document Revised: 12/03/2011 Document Reviewed: 04/15/2008 °[employer]® Patient Information ©2014 [employer]   [employer]. ° °

## 2013-12-31 NOTE — Progress Notes (Signed)
Nutrition Brief Note  Patient identified on the Malnutrition Screening Tool (MST) Report  Wt Readings from Last 15 Encounters:  12/30/13 174 lb (78.926 kg)  11/26/13 176 lb (79.833 kg)  10/29/13 176 lb (79.833 kg)  10/19/13 181 lb 4 oz (82.214 kg)  09/25/13 181 lb (82.101 kg)  09/14/13 179 lb (81.194 kg)  08/24/13 181 lb (82.101 kg)  07/27/13 180 lb (81.647 kg)  06/18/13 179 lb 6.4 oz (81.375 kg)  06/11/13 184 lb (83.462 kg)  05/28/13 184 lb 8 oz (83.689 kg)  02/05/13 187 lb (84.823 kg)  01/10/12 177 lb (80.287 kg)  01/10/12 177 lb (80.287 kg)  01/01/12 183 lb 12.8 oz (83.371 kg)   Pt admitted with chest pain. UBW ranges from 177-185#, weight changes likely due to outpatient Lasix use. Weight changes over the past 6-11 months are not clinically significant.  Body mass index is 28.96 kg/(m^2). Patient meets criteria for overweight based on current BMI.   Current diet order is Heart Healthy, patient is consuming approximately 100% of meals at this time. Labs and medications reviewed.   No nutrition interventions warranted at this time. If nutrition issues arise, please consult RD.   Sae Handrich A. Jimmye Norman, RD, LDN Pager: [phone]

## 2013-12-31 NOTE — Discharge Summary (Signed)
Physician Discharge Summary  [name] ENI:[id number] DOB: 1951/04/19 DOA: 12/30/2013  PCP: Redge Gainer, MD  Admit date: 12/30/2013 Discharge date: 12/31/2013  Time spent: 40 minutes  Recommendations for Outpatient Follow-up:  1. Patient will follow up with cardiology service in the next week. Appointment has been made prior to discharge.  Discharge Diagnoses:  Active Problems:   Chest pain  hypertension Diabetes. History of coronary artery disease Generalized anxiety disorder GERD Hyperlipidemia  Discharge Condition: improved  Diet recommendation: low salt, low carb  Filed Weights   12/30/13 1312  Weight: 78.926 kg (174 lb)    History of present illness:  Pt is 63 yo female with diabetes, HTN, HLD presented to [facility] ED with main concern of constant and pressure - like substernal chest pain, started earlier this AM prior to this admission, radiating to the left arm, 10/10 in severity, Pt denies similar events in the past and explains she called EMS after noting that nitroglycerin has not helped with symptoms. She denies shortness of breath, no abdominal or urinary concerns. No specific focal neurological symptoms.  In ED, pt is hemodynamically stable, [facility] asked to admit for chest pain work up. ACS rule out.    Hospital Course:  This patient was admitted to the hospital with atypical chest pain. She reports duration of symptoms lasting approximately 18 hours. The results with nitroglycerin. Due to her significant cardiac history, she was admitted to the hospital for observation. EKG did not show any acute findings. She ruled out for ACS with negative cardiac markers. Patient recently had a stress test done in 06/2013 which did not show any concerning findings. She does not wish to pursue any further inpatient evaluation. She is very adamant about being discharged home. She's not had any further symptoms since being admitted to the hospital. Close followup is scheduled for the next week  with cardiology. She's been advised to return to the hospital if she has any recurrence of her symptoms.  Procedures:    Consultations:    Discharge Exam: Filed Vitals:   12/31/13 1236  BP: 102/66  Pulse: 69  Temp: 97.8 F (36.6 C)  Resp: 18    General: NAd Cardiovascular: S1, s2 RRR Respiratory: CTA B  Discharge Instructions You were cared for by a hospitalist during your hospital stay. If you have any questions about your discharge medications or the care you received while you were in the hospital after you are discharged, you can call the unit and asked to speak with the hospitalist on call if the hospitalist that took care of you is not available. Once you are discharged, your primary care physician will handle any further medical issues. Please note that NO REFILLS for any discharge medications will be authorized once you are discharged, as it is imperative that you return to your primary care physician (or establish a relationship with a primary care physician if you do not have one) for your aftercare needs so that they can reassess your need for medications and monitor your lab values.  Discharge Orders   Future Appointments Provider Department Dept Phone   01/04/2014 10:30 AM [facility] Pharmac[facility] [place] [phone]   01/07/2014 1:30 PM Lendon Colonel, NP [facility] [place] [phone]   01/11/2014 8:30 AM [facility] [place] [phone]   01/14/2014 4:00 PM Chipper Herb, MD [place] [phone]   Future Orders Complete By Expires   Diet - low sodium heart healthy  As directed  Diet Carb Modified  As directed    Increase activity slowly  As directed        Medication List         ALPRAZolam 0.5 MG tablet  Commonly known as:  XANAX  Take 1 tablet (0.5 mg total) by mouth 3 (three) times daily as needed. For anxiety     aspirin 325 MG tablet  Take 325 mg by mouth  daily.     atenolol 50 MG tablet  Commonly known as:  TENORMIN  Take 1 tablet (50 mg total) by mouth 2 (two) times daily.     Canagliflozin 300 MG Tabs  Take 1 tablet (300 mg total) by mouth daily.     citalopram 20 MG tablet  Commonly known as:  CELEXA  Take 1 tablet (20 mg total) by mouth daily.     clopidogrel 75 MG tablet  Commonly known as:  PLAVIX  Take 1 tablet (75 mg total) by mouth daily.     esomeprazole 40 MG capsule  Commonly known as:  NEXIUM  Take 1 capsule (40 mg total) by mouth daily as needed. Indigestion     ezetimibe 10 MG tablet  Commonly known as:  ZETIA  Take 1 tablet (10 mg total) by mouth daily.     furosemide 40 MG tablet  Commonly known as:  LASIX  Take 1 tablet (40 mg total) by mouth 2 (two) times daily.     gabapentin 300 MG capsule  Commonly known as:  NEURONTIN  Take 1 capsule (300 mg total) by mouth daily as needed. Leg Pain     insulin lispro 100 UNIT/ML injection  Commonly known as:  HUMALOG  Insulin used in pump - average daily insulin amount is 100 units.     insulin pump Soln  As directed     isosorbide mononitrate 120 MG 24 hr tablet  Commonly known as:  IMDUR  Take 1 tablet (120 mg total) by mouth daily.     lisinopril 20 MG tablet  Commonly known as:  PRINIVIL,ZESTRIL  Take 1 tablet (20 mg total) by mouth 2 (two) times daily.     nitroGLYCERIN 0.4 MG SL tablet  Commonly known as:  NITROSTAT  Place 1 tablet (0.4 mg total) under the tongue every 5 (five) minutes as needed. For chest pain     potassium chloride SA 20 MEQ tablet  Commonly known as:  K-DUR,KLOR-CON  Take 1 tablet (20 mEq total) by mouth daily.     rosuvastatin 40 MG tablet  Commonly known as:  CRESTOR  Take 1 tablet (40 mg total) by mouth daily.     Vitamin D (Ergocalciferol) 50000 UNITS Caps capsule  Commonly known as:  DRISDOL  Take 1 capsule (50,000 Units total) by mouth every 7 (seven) days. Friday     zolpidem 10 MG tablet  Commonly known as:   AMBIEN  For sleep,1/2 to 1 prn at bedtime       Allergies  Allergen Reactions  . Codeine Nausea And Vomiting  . 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 Ticlid [Ticlopidine Hcl] Nausea And Vomiting       Follow-up Information   Follow up with Jory Sims, NP On 01/07/2014. (Cardiology appointment Thursday April 16th at 1:30pm)    Specialty:  Nurse Practitioner  Contact information:   [place] [phone]       Follow up with Redge Gainer, MD. Schedule an appointment as soon as possible for a visit in 2 weeks.   Specialty:  Family Medicine   Contact information:   [address] [place] [address] [phone]        The results of significant diagnostics from this hospitalization (including imaging, microbiology, ancillary and laboratory) are listed below for reference.    Significant Diagnostic Studies: Dg Chest Port 1 View  12/30/2013   CLINICAL DATA:  Chest pain and nausea.  EXAM: PORTABLE CHEST - 1 VIEW  COMPARISON:  Single view of the chest 04/29/2012 and 08/23/2011.  FINDINGS: The lungs are clear. Heart size is normal. No pneumothorax or pleural fluid.  IMPRESSION: No acute disease.   Electronically Signed   By: Inge Rise M.D.   On: 12/30/2013 14:07    Microbiology: No results found for this or any previous visit (from the past 240 hour(s)).   Labs: Basic Metabolic Panel:  Recent Labs Lab 12/30/13 1349  NA 140  K 4.2  CL 101  CO2 28  GLUCOSE 226*  BUN 13  CREATININE 1.06  CALCIUM 9.4   Liver Function Tests:  Recent Labs Lab 12/30/13 1349  AST 24  ALT 22  ALKPHOS 57  BILITOT 0.5  PROT 7.8  ALBUMIN 3.7    Recent Labs Lab 12/30/13 1349  LIPASE 23   No results found for this basename: AMMONIA,  in the last 168 hours CBC:  Recent Labs Lab  12/30/13 1349  WBC 5.7  NEUTROABS 3.1  HGB 12.7  HCT 36.8  MCV 85.6  PLT 156   Cardiac Enzymes:  Recent Labs Lab 12/30/13 1349 12/30/13 1614 12/30/13 1836 12/31/13 0056 12/31/13 0503  TROPONINI <0.30 <0.30 <0.30 <0.30 <0.30   BNP: BNP (last 3 results) No results found for this basename: PROBNP,  in the last 8760 hours CBG:  Recent Labs Lab 12/30/13 2142 12/30/13 2352 12/31/13 0723 12/31/13 1116 12/31/13 1607  GLUCAP 252* 192* 211* 194* 345*       Signed:  Kairi Harshbarger  [facility] 12/31/2013, 8:56 PM

## 2013-12-31 NOTE — Progress Notes (Signed)
UR chart review completed.  

## 2014-01-04 ENCOUNTER — Encounter: Payer: Self-pay | Admitting: *Deleted

## 2014-01-04 ENCOUNTER — Ambulatory Visit (INDEPENDENT_AMBULATORY_CARE_PROVIDER_SITE_OTHER): Payer: Medicare HMO | Admitting: Pharmacist

## 2014-01-04 ENCOUNTER — Encounter: Payer: Self-pay | Admitting: Pharmacist

## 2014-01-04 VITALS — BP 124/70 | HR 74 | Ht 65.0 in | Wt 174.8 lb

## 2014-01-04 DIAGNOSIS — E1165 Type 2 diabetes mellitus with hyperglycemia: Secondary | ICD-10-CM

## 2014-01-04 DIAGNOSIS — IMO0002 Reserved for concepts with insufficient information to code with codable children: Secondary | ICD-10-CM

## 2014-01-04 DIAGNOSIS — R079 Chest pain, unspecified: Secondary | ICD-10-CM

## 2014-01-04 DIAGNOSIS — IMO0001 Reserved for inherently not codable concepts without codable children: Secondary | ICD-10-CM

## 2014-01-04 NOTE — Progress Notes (Signed)
Diabetes Follow-Up Visit Chief Complaint:   No chief complaint on file.    Filed Vitals:   01/04/14 1049  BP: 124/70  Pulse: 74   Filed Weights   01/04/14 1049  Weight: 174 lb 12 oz (79.266 kg)    HPI: Patient was recently hospitalized 12/30/13 through 4//9/15 for chest pain.  Has labs drawn in hospital. Patient has had an insulin pump for about 1 year.   She has been following up regularly for adjustments to her pump.  She was seen about 6 weeks ago and basal rate was increased and Invokana increased to 300mg  1 tablet daily.  Patient has tried metformin regular and XR and very low dose but has not been able to tolerate in the past due to diarrhea and nausea.   Her current pump setting are:  Basal Rate: 1.85 units/hour all day  CHO ration to 7.0   Insulin Sensitivity - 10   BG goal 110  Active Insulin Time - 4 hours  Home BG Monitoring:  Checking 1 time a day. Average:  273 High: over 400   Low:  174   Patient has keto sticks at home and tests when BG greater than 400.  She has not had any positive urine ketone readings.  Exam Edema:  negative  Polyuria:  Negative today but she does report increased frequency at time Polydipsia:  Negative             Polyphagia:  negative  BMI:  Body mass index is 29.08 kg/(m^2).   Weight changes:  stable General Appearance:  alert, oriented, no acute distress and obese Mood/Affect:  normal   Low fat/carbohydrate diet?  Yes - continues to limit CHO portions sizes  Nicotine Abuse?  No Medication Compliance?  Yes Exercise?  No Alcohol Abuse?  No   Lab Results  Component Value Date   HGBA1C 10.0* 12/30/2013     Assessment: 1.  Diabetes.  Uncontrolled  2.  Blood Pressure.  good  Recommendations: 1.  Medication recommendations at this time are as follows:    Change basal rate to 2.10 units/hr from midnight to midnight  Continue Invokana 300mg  1 tablet daily  2.  Reviewed HBG goals:  Fasting 80-130 and 1-2 hour post prandial  <180.  Patient is instructed to check BG 3 to 5  times per day.   Really stressed that she needs to check BG at least prior to each meal! 3.  BP goal < 140/80. 4.  Return to clinic in 4 weeks 5.  Patient triage to have EKG due to chest pain   Time spent counseling patient:  15 minutes   Cherre Robins, PharmD, CPP

## 2014-01-04 NOTE — Progress Notes (Signed)
While patient was here seeing Pharm-D she mentioned that her chest was still hurting and that she felt SOB. Nurse done EKG and vitals and O2 level- that were all WNL. Her cardiology office was contacted and her appt was moved up from Thursday 01/07/14 to tomorrow 01/05/14 at 3:30. Patient and cardio aware of this and all done today will be visible to them on EMR.

## 2014-01-05 ENCOUNTER — Encounter: Payer: Self-pay | Admitting: *Deleted

## 2014-01-05 ENCOUNTER — Ambulatory Visit (INDEPENDENT_AMBULATORY_CARE_PROVIDER_SITE_OTHER): Payer: Commercial Managed Care - HMO | Admitting: Adult Health

## 2014-01-05 ENCOUNTER — Encounter: Payer: Self-pay | Admitting: Adult Health

## 2014-01-05 VITALS — BP 135/71 | HR 77 | Ht 66.0 in | Wt 173.0 lb

## 2014-01-05 DIAGNOSIS — E1165 Type 2 diabetes mellitus with hyperglycemia: Secondary | ICD-10-CM

## 2014-01-05 DIAGNOSIS — I6529 Occlusion and stenosis of unspecified carotid artery: Secondary | ICD-10-CM

## 2014-01-05 DIAGNOSIS — IMO0002 Reserved for concepts with insufficient information to code with codable children: Secondary | ICD-10-CM

## 2014-01-05 DIAGNOSIS — I2510 Atherosclerotic heart disease of native coronary artery without angina pectoris: Secondary | ICD-10-CM

## 2014-01-05 DIAGNOSIS — Z01812 Encounter for preprocedural laboratory examination: Secondary | ICD-10-CM

## 2014-01-05 DIAGNOSIS — IMO0001 Reserved for inherently not codable concepts without codable children: Secondary | ICD-10-CM

## 2014-01-05 DIAGNOSIS — G459 Transient cerebral ischemic attack, unspecified: Secondary | ICD-10-CM

## 2014-01-05 LAB — CBC WITH DIFFERENTIAL/PLATELET
BASOS ABS: 0 10*3/uL (ref 0.0–0.1)
BASOS PCT: 0 % (ref 0–1)
EOS ABS: 0.1 10*3/uL (ref 0.0–0.7)
EOS PCT: 2 % (ref 0–5)
HEMATOCRIT: 42 % (ref 36.0–46.0)
Hemoglobin: 14.8 g/dL (ref 12.0–15.0)
Lymphocytes Relative: 45 % (ref 12–46)
Lymphs Abs: 3.1 10*3/uL (ref 0.7–4.0)
MCH: 30.2 pg (ref 26.0–34.0)
MCHC: 35.2 g/dL (ref 30.0–36.0)
MCV: 85.7 fL (ref 78.0–100.0)
MONO ABS: 0.4 10*3/uL (ref 0.1–1.0)
Monocytes Relative: 6 % (ref 3–12)
Neutro Abs: 3.2 10*3/uL (ref 1.7–7.7)
Neutrophils Relative %: 47 % (ref 43–77)
PLATELETS: 193 10*3/uL (ref 150–400)
RBC: 4.9 MIL/uL (ref 3.87–5.11)
RDW: 13.1 % (ref 11.5–15.5)
WBC: 6.9 10*3/uL (ref 4.0–10.5)

## 2014-01-05 LAB — BASIC METABOLIC PANEL
BUN: 16 mg/dL (ref 6–23)
CALCIUM: 10.3 mg/dL (ref 8.4–10.5)
CO2: 33 mEq/L — ABNORMAL HIGH (ref 19–32)
CREATININE: 1.19 mg/dL — AB (ref 0.50–1.10)
Chloride: 97 mEq/L (ref 96–112)
Glucose, Bld: 257 mg/dL — ABNORMAL HIGH (ref 70–99)
Potassium: 4.4 mEq/L (ref 3.5–5.3)
Sodium: 141 mEq/L (ref 135–145)

## 2014-01-05 LAB — APTT: aPTT: 28.7 seconds (ref 24–37)

## 2014-01-05 LAB — PROTIME-INR
INR: 0.94 (ref <1.50)
Prothrombin Time: 12.4 seconds (ref 11.6–15.2)

## 2014-01-05 NOTE — Assessment & Plan Note (Signed)
She will need to have bilateral ABIs completed and she does have diminished pulses on the left. This can be done as an outpatient once cardiac catheterization is completed.

## 2014-01-05 NOTE — Patient Instructions (Signed)
Your physician recommends that you schedule a follow-up appointment in: To be determined  Your physician recommends that you return for lab work Stat for Cath.  Your physician has requested that you have a cardiac catheterization. Cardiac catheterization is used to diagnose and/or treat various heart conditions. Doctors may recommend this procedure for a number of different reasons. The most common reason is to evaluate chest pain. Chest pain can be a symptom of coronary artery disease (CAD), and cardiac catheterization can show whether plaque is narrowing or blocking your heart's arteries. This procedure is also used to evaluate the valves, as well as measure the blood flow and oxygen levels in different parts of your heart. For further information please visit [email or web address]. Please follow instruction sheet, as given.

## 2014-01-05 NOTE — Assessment & Plan Note (Signed)
PATIENT HAS INSULIN PUMP!!! She is advised to decrease her dose by half the night before and to bring pump with her to cardiac cath lab in NPO status.

## 2014-01-05 NOTE — Assessment & Plan Note (Signed)
Pressure is currently controlled. We will continue current medication regimen as directed. Adjustments can be made as necessary posthospitalization on followup.

## 2014-01-05 NOTE — Progress Notes (Deleted)
Name: [name]    DOB: 02/18/1951  Age: 63 y.o.  MR#: [id number]       PCP:  Redge Gainer, MD      Insurance: Payor: [email or web address] MEDICARE / Plan: [employer] MEDICARE HMO / Product Type: *No Product type* /   CC:    Chief Complaint  Patient presents with  . Chest Pain  . Hypertension    VS Filed Vitals:   01/05/14 1556  BP: 135/71  Pulse: 77  Height: 5\' 6"  (1.676 m)  Weight: 173 lb (78.472 kg)    Weights Current Weight  01/05/14 173 lb (78.472 kg)  01/04/14 174 lb 12 oz (79.266 kg)  12/30/13 174 lb (78.926 kg)    Blood Pressure  BP Readings from Last 3 Encounters:  01/05/14 135/71  01/04/14 124/70  12/31/13 102/66     Admit date:  (Not on file) Last encounter with RMR:  Visit date not found   Allergy Codeine; Iohexol; and Ticlid  Current Outpatient Prescriptions  Medication Sig Dispense Refill  . ALPRAZolam (XANAX) 0.5 MG tablet Take 1 tablet (0.5 mg total) by mouth 3 (three) times daily as needed. For anxiety  90 tablet  1  . aspirin 325 MG tablet Take 325 mg by mouth daily.        Marland Kitchen atenolol (TENORMIN) 50 MG tablet Take 1 tablet (50 mg total) by mouth 2 (two) times daily.  180 tablet  1  . Canagliflozin 300 MG TABS Take 1 tablet (300 mg total) by mouth daily.  30 tablet  2  . citalopram (CELEXA) 20 MG tablet Take 1 tablet (20 mg total) by mouth daily.  30 tablet  3  . clopidogrel (PLAVIX) 75 MG tablet Take 1 tablet (75 mg total) by mouth daily.  90 tablet  1  . esomeprazole (NEXIUM) 40 MG capsule Take 1 capsule (40 mg total) by mouth daily as needed. Indigestion  90 capsule  4  . ezetimibe (ZETIA) 10 MG tablet Take 1 tablet (10 mg total) by mouth daily.  90 tablet  4  . furosemide (LASIX) 40 MG tablet Take 1 tablet (40 mg total) by mouth 2 (two) times daily.  180 tablet  1  . gabapentin (NEURONTIN) 300 MG capsule Take 1 capsule (300 mg total) by mouth daily as needed. Leg Pain  90 capsule  4  . Insulin Human (INSULIN PUMP) SOLN As directed      . insulin lispro (HUMALOG)  100 UNIT/ML injection Insulin used in pump - average daily insulin amount is 100 units.  20 mL  0  . isosorbide mononitrate (IMDUR) 120 MG 24 hr tablet Take 1 tablet (120 mg total) by mouth daily.  30 tablet  11  . lisinopril (PRINIVIL,ZESTRIL) 20 MG tablet Take 1 tablet (20 mg total) by mouth 2 (two) times daily.  90 tablet  4  . nitroGLYCERIN (NITROSTAT) 0.4 MG SL tablet Place 1 tablet (0.4 mg total) under the tongue every 5 (five) minutes as needed. For chest pain  30 tablet  11  . potassium chloride SA (K-DUR,KLOR-CON) 20 MEQ tablet Take 1 tablet (20 mEq total) by mouth daily.  90 tablet  1  . rosuvastatin (CRESTOR) 40 MG tablet Take 1 tablet (40 mg total) by mouth daily.  90 tablet  4  . Vitamin D, Ergocalciferol, (DRISDOL) 50000 UNITS CAPS capsule Take 1 capsule (50,000 Units total) by mouth every 7 (seven) days. Friday  12 capsule  4  . zolpidem (AMBIEN) 10 MG tablet For  sleep,1/2 to 1 prn at bedtime  90 tablet  1   No current facility-administered medications for this visit.    Discontinued Meds:   There are no discontinued medications.  Patient Active Problem List   Diagnosis Date Noted  . Chest pain 12/30/2013  . Generalized anxiety disorder 09/14/2013  . Claudication 06/18/2013  . Diverticulosis of colon 01/01/2012  . Left lower quadrant pain 01/01/2012  . GERD (gastroesophageal reflux disease) 01/01/2012  . Constipation 01/01/2012  . Hypokalemia 08/24/2011  . Diabetes type 2, uncontrolled 08/23/2011  . Essential hypertension, benign 08/23/2011  . PALPITATIONS 02/10/2010  . HYPERLIPIDEMIA 05/10/2009  . DEPRESSION 05/10/2009  . MULTIPLE SCLEROSIS 05/10/2009  . CAD, NATIVE VESSEL 08/26/2008  . 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 08/26/2008    LABS    Component Value Date/Time   NA 140 12/30/2013 1349   NA 141 10/19/2013 1046   NA 139 09/14/2013 1533   NA 140 06/11/2013 0931   NA 137 02/05/2013 1002   NA 137 08/25/2011 0600   K 4.2 12/30/2013 1349   K 3.9 10/19/2013 1046   K 3.4*  09/14/2013 1533   CL 101 12/30/2013 1349   CL 103 10/19/2013 1046   CL 98 09/14/2013 1533   CO2 28 12/30/2013 1349   CO2 22 10/19/2013 1046   CO2 25 09/14/2013 1533   GLUCOSE 226* 12/30/2013 1349   GLUCOSE 180* 10/19/2013 1046   GLUCOSE 266* 09/14/2013 1533   GLUCOSE 342* 06/11/2013 0931   GLUCOSE 222* 02/05/2013 1002   GLUCOSE 278* 08/25/2011 0600   BUN 13 12/30/2013 1349   BUN 8 10/19/2013 1046   BUN 9 09/14/2013 1533   BUN 7* 06/11/2013 0931   BUN 11 02/05/2013 1002   BUN 13 08/25/2011 0600   CREATININE 1.06 12/30/2013 1349   CREATININE 0.87 10/19/2013 1046   CREATININE 0.87 09/14/2013 1533   CREATININE 0.96 02/05/2013 1002   CALCIUM 9.4 12/30/2013 1349   CALCIUM 9.2 10/19/2013 1046   CALCIUM 9.1 09/14/2013 1533   GFRNONAA 54* 12/30/2013 1349   GFRNONAA 71 10/19/2013 1046   GFRNONAA 71 09/14/2013 1533   GFRNONAA 63 02/05/2013 1002   GFRAA 62* 12/30/2013 1349   GFRAA 81 10/19/2013 1046   GFRAA 81 09/14/2013 1533   GFRAA 72 02/05/2013 1002   CMP     Component Value Date/Time   NA 140 12/30/2013 1349   NA 141 10/19/2013 1046   K 4.2 12/30/2013 1349   CL 101 12/30/2013 1349   CO2 28 12/30/2013 1349   GLUCOSE 226* 12/30/2013 1349   GLUCOSE 180* 10/19/2013 1046   BUN 13 12/30/2013 1349   BUN 8 10/19/2013 1046   CREATININE 1.06 12/30/2013 1349   CREATININE 0.96 02/05/2013 1002   CALCIUM 9.4 12/30/2013 1349   PROT 7.8 12/30/2013 1349   PROT 6.6 09/14/2013 1533   ALBUMIN 3.7 12/30/2013 1349   AST 24 12/30/2013 1349   ALT 22 12/30/2013 1349   ALKPHOS 57 12/30/2013 1349   BILITOT 0.5 12/30/2013 1349   GFRNONAA 54* 12/30/2013 1349   GFRNONAA 63 02/05/2013 1002   GFRAA 62* 12/30/2013 1349   GFRAA 72 02/05/2013 1002       Component Value Date/Time   WBC 5.7 12/30/2013 1349   WBC 6.3 09/14/2013 1607   WBC 8.1 06/11/2013 0943   WBC 8.8 02/05/2013 1025   WBC 6.2 08/23/2011 2131   WBC 8.3 08/23/2011 1457   HGB 12.7 12/30/2013 1349   HGB 13.6 09/14/2013 1607   HGB  14.2 06/11/2013 0943   HGB 15.0 02/05/2013 1025   HGB 13.3 08/23/2011  2131   HGB 15.0 08/23/2011 1521   HCT 36.8 12/30/2013 1349   HCT 42.5 09/14/2013 1607   HCT 42.2 06/11/2013 0943   HCT 45.1 02/05/2013 1025   HCT 37.8 08/23/2011 2131   HCT 44.0 08/23/2011 1521   MCV 85.6 12/30/2013 1349   MCV 87.7 09/14/2013 1607   MCV 87.7 06/11/2013 0943   MCV 88.0 02/05/2013 1025   MCV 84.2 08/23/2011 2131   MCV 83.9 08/23/2011 1457    Lipid Panel     Component Value Date/Time   CHOL 134 12/30/2013 1836   TRIG 138 12/30/2013 1836   TRIG 143 02/05/2013 1002   HDL 40 12/30/2013 1836   CHOLHDL 3.4 12/30/2013 1836   VLDL 28 12/30/2013 1836   LDLCALC 66 12/30/2013 1836   LDLCALC 43 02/05/2013 1002    ABG    Component Value Date/Time   TCO2 29 08/23/2011 1521     Lab Results  Component Value Date   TSH 1.052 02/05/2013   BNP (last 3 results) No results found for this basename: PROBNP,  in the last 8760 hours Cardiac Panel (last 3 results) No results found for this basename: CKTOTAL, CKMB, TROPONINI, RELINDX,  in the last 72 hours  Iron/TIBC/Ferritin No results found for this basename: iron, tibc, ferritin     EKG Orders placed in visit on 01/04/14  . EKG 12-LEAD     Prior Assessment and Plan Problem List as of 01/05/2014     Cardiovascular and Mediastinum   CAD, NATIVE VESSEL   Last Assessment & Plan   06/18/2013 Office Visit Written 06/18/2013  9:46 AM by Satira Sark, MD     History reviewed, overall medical regimen looks reasonable from a cardiac perspective. She did have low risk ischemic testing a little over a year ago at [facility], although is reporting increased chest pain symptoms in the setting of stress. 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 Plan will be to followup with a Lexis scan Myoview to exclude any progressive ischemic burden that may need to be investigated further. Otherwise we will establish followup and review with her in the office.    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 She is on statin and antiplatelet regimen.    Essential hypertension, benign     Digestive   Diverticulosis of colon   GERD (gastroesophageal reflux disease)   Last Assessment & Plan   01/01/2012 Office Visit Written 01/01/2012 10:33 AM by Mahala Menghini, PA     Chronic intermittent GERD with prn use of Nexium. Offered her EGD to rule out Barrett's but she declined. Continue antireflux measures and PPI.    Constipation   Last Assessment & Plan   01/01/2012 Office Visit Written 01/01/2012 10:34 AM by Mahala Menghini, PA     Add Miralax 17g daily. First three days, may take bid.      Endocrine   Diabetes type 2, uncontrolled     Nervous and Auditory   MULTIPLE SCLEROSIS     Other   HYPERLIPIDEMIA   Last Assessment & Plan   06/18/2013 Office Visit Written 06/18/2013  9:47 AM by Satira Sark, MD     Generally well controlled, continues on statin.  DEPRESSION   PALPITATIONS   Hypokalemia   Left lower quadrant pain   Last Assessment & Plan   01/01/2012 Office Visit Written 01/01/2012 10:33 AM by Mahala Menghini, PA     Constant. Exam benign. Doubt acute diverticulitis. Colonoscopy as planned.    Claudication   Last Assessment & Plan   06/18/2013 Office Visit Written 06/18/2013  9:47 AM by Satira Sark, MD     We will obtain lower extremity arterial Dopplers.    Generalized anxiety disorder   Chest pain       Imaging: Dg Chest Port 1 View  12/30/2013   CLINICAL DATA:  Chest pain and nausea.  EXAM: PORTABLE CHEST - 1 VIEW  COMPARISON:  Single view of the chest 04/29/2012 and 08/23/2011.  FINDINGS: The lungs are clear. Heart size is normal. No pneumothorax or pleural fluid.  IMPRESSION: No acute disease.   Electronically Signed   By: Inge Rise M.D.   On: 12/30/2013 14:07

## 2014-01-05 NOTE — Assessment & Plan Note (Signed)
She will continue on statin therapy along with aspirin. Followup labs will be done in 6 months.

## 2014-01-05 NOTE — Assessment & Plan Note (Addendum)
Known history of stents to the LAD and PTCA to diagonal in 2008. She was recently seen at [facility] for overnight stay in the setting of unstable angina but refused to stay or be transferred to [facility] for cardiac cath, if necessary when seen by cardiology. She is here now on hospital follow up with recurrent symptoms. I have reviewed the patient with Dr. Bronson Ing on site who agrees that she would benefit from cardiac cath in this setting.  She will be scheduled for cardiac catheterization on April 16 at 3 PM with Dr. Aundra Dubin. She has been advised that she may need a PCI, vs. other intervention depending upon what they find, and will possibly need to stay overnight. She verbalizes understanding. She is happy that it is Dr. Aundra Dubin, if she notices family well. In the interim, she will hold off on the Plavix to reduce bleeding risk. Orders have been written.  She is advised, that she should she have recurrent discomfort, or worsening symptoms, unrelenting, or increased severity, she is to present to the nearest emergency room where she will be transferred to [facility].

## 2014-01-05 NOTE — Progress Notes (Signed)
HPI: [name] is a 63 year old patient of Dr. Domenic Polite were following post hospitalization where she was admitted to [facility] in the setting of recurrent chest pain with known history of hypertension, CAD, and hyperlipidemia appear patient was ruled out for ACS. Stress test recently completed in October 2014 was found to be normal. She does now wish to pursue any further inpatient evaluation. No medications were changed. She is here for posthospitalization followup.    She comes today with recurrent pain in her similar to her anginal equivalent. Pain between the shoulder blades some dizziness and shortness of breath, and left arm numbness. She is taking NTG several times a week, despite use of nitrates. She also has a history of anxiety.       Allergies  Allergen Reactions  . Codeine Nausea And Vomiting  . Iohexol      Desc: pt had syncopal episode with nausea post IV CM late 1990's,  pt has had prednisone prep with heart caths x 2 without problem  kdean 04/16/07, Onset Date: [id number]   . Ticlid [Ticlopidine Hcl] Nausea And Vomiting    Current Outpatient Prescriptions  Medication Sig Dispense Refill  . ALPRAZolam (XANAX) 0.5 MG tablet Take 1 tablet (0.5 mg total) by mouth 3 (three) times daily as needed. For anxiety  90 tablet  1  . aspirin 325 MG tablet Take 325 mg by mouth daily.        Marland Kitchen atenolol (TENORMIN) 50 MG tablet Take 1 tablet (50 mg total) by mouth 2 (two) times daily.  180 tablet  1  . Canagliflozin 300 MG TABS Take 1 tablet (300 mg total) by mouth daily.  30 tablet  2  . citalopram (CELEXA) 20 MG tablet Take 1 tablet (20 mg total) by mouth daily.  30 tablet  3  . clopidogrel (PLAVIX) 75 MG tablet Take 1 tablet (75 mg total) by mouth daily.  90 tablet  1  . esomeprazole (NEXIUM) 40 MG capsule Take 1 capsule (40 mg total) by mouth daily as needed. Indigestion  90 capsule  4  . ezetimibe (ZETIA) 10 MG tablet Take 1 tablet (10 mg total) by mouth daily.  90 tablet  4    . furosemide (LASIX) 40 MG tablet Take 1 tablet (40 mg total) by mouth 2 (two) times daily.  180 tablet  1  . gabapentin (NEURONTIN) 300 MG capsule Take 1 capsule (300 mg total) by mouth daily as needed. Leg Pain  90 capsule  4  . Insulin Human (INSULIN PUMP) SOLN As directed      . insulin lispro (HUMALOG) 100 UNIT/ML injection Insulin used in pump - average daily insulin amount is 100 units.  20 mL  0  . isosorbide mononitrate (IMDUR) 120 MG 24 hr tablet Take 1 tablet (120 mg total) by mouth daily.  30 tablet  11  . lisinopril (PRINIVIL,ZESTRIL) 20 MG tablet Take 1 tablet (20 mg total) by mouth 2 (two) times daily.  90 tablet  4  . nitroGLYCERIN (NITROSTAT) 0.4 MG SL tablet Place 1 tablet (0.4 mg total) under the tongue every 5 (five) minutes as needed. For chest pain  30 tablet  11  . potassium chloride SA (K-DUR,KLOR-CON) 20 MEQ tablet Take 1 tablet (20 mEq total) by mouth daily.  90 tablet  1  . rosuvastatin (CRESTOR) 40 MG tablet Take 1 tablet (40 mg total) by mouth daily.  90 tablet  4  . Vitamin D, Ergocalciferol, (DRISDOL) 50000 UNITS  CAPS capsule Take 1 capsule (50,000 Units total) by mouth every 7 (seven) days. Friday  12 capsule  4  . zolpidem (AMBIEN) 10 MG tablet For sleep,1/2 to 1 prn at bedtime  90 tablet  1   No current facility-administered medications for this visit.    Past Medical History  Diagnosis Date  . Diverticulitis, colon   . MS (multiple sclerosis)     Not confirmed  . Depression   . PVD (peripheral vascular disease)   . HLD (hyperlipidemia)   . Essential hypertension, benign   . CAD (coronary artery disease)     DES to circumflex 02/2007, BMS to LAD and PTCA diagonal 03/2007  . Carotid artery plaque     Mild  . IDDM (insulin dependent diabetes mellitus)   . GERD (gastroesophageal reflux disease)   . NSTEMI (non-ST elevated myocardial infarction)     02/2007  . Prolapse of uterus   . TIA (transient ischemic attack)   . PAT (paroxysmal atrial tachycardia)    . Hemiplegic migraine   . Anxiety   . History of pneumonia     Past Surgical History  Procedure Laterality Date  . Appendectomy    . Cholecystectomy    . Partial hysterectomy  1986    ovaries remain - prolaspe uterus   . Breast lumpectomy      Dr. Charlynne Pander   . Colonoscopy  2002    Dr. Anwar--> Severe diverticular changes in the region of the sigmoid and descending colon with scattered diverticular changes throughout the rest of the colon. No polyps, ulcerations. Despite numerous manipulations, the tip of the scope could not be tipped into the cecal area.  . Colonoscopy  01/10/2012    Procedure: COLONOSCOPY;  Surgeon: Daneil Dolin, MD;  Location: [facility];  Service: Endoscopy;  Laterality: N/A;  1:55    ROS: Review of systems complete and found to be negative unless listed above  PHYSICAL EXAM BP 135/71  Pulse 77  Ht 5\' 6"  (1.676 m)  Wt 173 lb (78.472 kg)  BMI 27.94 kg/m2  General: Well developed, well nourished, in no acute distress, obese.  Head: Eyes PERRLA, No xanthomas.   Normal cephalic and atramatic  Lungs: Clear bilaterally to auscultation and percussion. Heart: HRRR S1 S2, without MRG.  Pulses are 2+ & equal.            No carotid bruit. No JVD.  No abdominal bruits. No femoral bruits. Abdomen: Bowel sounds are positive, abdomen soft and non-tender without masses or                  Hernia's noted. Msk:  Back normal, normal gait. Normal strength and tone for age. Extremities: No clubbing, cyanosis or edema.  DP +1 Neuro: Alert and oriented X 3. Psych:  Good affect, responds appropriately   EKG:  NSR with inferior and anterior Q waves.   ASSESSMENT AND PLAN

## 2014-01-06 ENCOUNTER — Encounter (HOSPITAL_COMMUNITY): Payer: Self-pay | Admitting: Pharmacy Technician

## 2014-01-07 ENCOUNTER — Encounter: Payer: Medicare HMO | Admitting: Adult Health

## 2014-01-07 ENCOUNTER — Observation Stay (HOSPITAL_COMMUNITY)
Admission: RE | Admit: 2014-01-07 | Discharge: 2014-01-08 | Disposition: A | Discharge status: Home / Self Care | Payer: Medicare HMO | Source: Ambulatory Visit | Attending: Cardiology | Admitting: Cardiology

## 2014-01-07 ENCOUNTER — Encounter (HOSPITAL_COMMUNITY): Payer: Self-pay | Admitting: General Practice

## 2014-01-07 ENCOUNTER — Encounter (HOSPITAL_COMMUNITY)
Admission: RE | Disposition: A | Discharge status: Home / Self Care | Payer: Medicare HMO | Source: Ambulatory Visit | Attending: Cardiology

## 2014-01-07 DIAGNOSIS — E1169 Type 2 diabetes mellitus with other specified complication: Secondary | ICD-10-CM | POA: Diagnosis present

## 2014-01-07 DIAGNOSIS — I25110 Atherosclerotic heart disease of native coronary artery with unstable angina pectoris: Secondary | ICD-10-CM | POA: Diagnosis present

## 2014-01-07 DIAGNOSIS — R7989 Other specified abnormal findings of blood chemistry: Secondary | ICD-10-CM | POA: Diagnosis present

## 2014-01-07 DIAGNOSIS — Z9641 Presence of insulin pump (external) (internal): Secondary | ICD-10-CM | POA: Insufficient documentation

## 2014-01-07 DIAGNOSIS — G35 Multiple sclerosis: Secondary | ICD-10-CM | POA: Insufficient documentation

## 2014-01-07 DIAGNOSIS — Z7982 Long term (current) use of aspirin: Secondary | ICD-10-CM | POA: Insufficient documentation

## 2014-01-07 DIAGNOSIS — E1165 Type 2 diabetes mellitus with hyperglycemia: Secondary | ICD-10-CM | POA: Diagnosis present

## 2014-01-07 DIAGNOSIS — Z8673 Personal history of transient ischemic attack (TIA), and cerebral infarction without residual deficits: Secondary | ICD-10-CM | POA: Insufficient documentation

## 2014-01-07 DIAGNOSIS — E782 Mixed hyperlipidemia: Secondary | ICD-10-CM | POA: Diagnosis present

## 2014-01-07 DIAGNOSIS — I2510 Atherosclerotic heart disease of native coronary artery without angina pectoris: Secondary | ICD-10-CM

## 2014-01-07 DIAGNOSIS — I200 Unstable angina: Secondary | ICD-10-CM | POA: Diagnosis present

## 2014-01-07 DIAGNOSIS — Z794 Long term (current) use of insulin: Secondary | ICD-10-CM | POA: Insufficient documentation

## 2014-01-07 DIAGNOSIS — K219 Gastro-esophageal reflux disease without esophagitis: Secondary | ICD-10-CM | POA: Diagnosis present

## 2014-01-07 DIAGNOSIS — G459 Transient cerebral ischemic attack, unspecified: Secondary | ICD-10-CM

## 2014-01-07 DIAGNOSIS — R791 Abnormal coagulation profile: Secondary | ICD-10-CM | POA: Insufficient documentation

## 2014-01-07 DIAGNOSIS — R079 Chest pain, unspecified: Secondary | ICD-10-CM | POA: Diagnosis present

## 2014-01-07 DIAGNOSIS — F3289 Other specified depressive episodes: Secondary | ICD-10-CM | POA: Insufficient documentation

## 2014-01-07 DIAGNOSIS — I10 Essential (primary) hypertension: Secondary | ICD-10-CM | POA: Diagnosis present

## 2014-01-07 DIAGNOSIS — IMO0001 Reserved for inherently not codable concepts without codable children: Secondary | ICD-10-CM | POA: Insufficient documentation

## 2014-01-07 DIAGNOSIS — F411 Generalized anxiety disorder: Secondary | ICD-10-CM | POA: Diagnosis present

## 2014-01-07 DIAGNOSIS — Z01812 Encounter for preprocedural laboratory examination: Secondary | ICD-10-CM

## 2014-01-07 DIAGNOSIS — I25119 Atherosclerotic heart disease of native coronary artery with unspecified angina pectoris: Secondary | ICD-10-CM | POA: Diagnosis present

## 2014-01-07 DIAGNOSIS — Z9861 Coronary angioplasty status: Secondary | ICD-10-CM | POA: Insufficient documentation

## 2014-01-07 DIAGNOSIS — I6529 Occlusion and stenosis of unspecified carotid artery: Secondary | ICD-10-CM

## 2014-01-07 DIAGNOSIS — I252 Old myocardial infarction: Secondary | ICD-10-CM | POA: Insufficient documentation

## 2014-01-07 DIAGNOSIS — R0789 Other chest pain: Principal | ICD-10-CM | POA: Insufficient documentation

## 2014-01-07 DIAGNOSIS — E785 Hyperlipidemia, unspecified: Secondary | ICD-10-CM | POA: Insufficient documentation

## 2014-01-07 DIAGNOSIS — R0602 Shortness of breath: Secondary | ICD-10-CM | POA: Insufficient documentation

## 2014-01-07 DIAGNOSIS — I739 Peripheral vascular disease, unspecified: Secondary | ICD-10-CM | POA: Insufficient documentation

## 2014-01-07 DIAGNOSIS — E1159 Type 2 diabetes mellitus with other circulatory complications: Secondary | ICD-10-CM | POA: Diagnosis present

## 2014-01-07 DIAGNOSIS — Z7902 Long term (current) use of antithrombotics/antiplatelets: Secondary | ICD-10-CM | POA: Insufficient documentation

## 2014-01-07 DIAGNOSIS — F329 Major depressive disorder, single episode, unspecified: Secondary | ICD-10-CM | POA: Insufficient documentation

## 2014-01-07 HISTORY — PX: CARDIAC CATHETERIZATION: SHX172

## 2014-01-07 HISTORY — DX: Personal history of other diseases of the digestive system: Z87.19

## 2014-01-07 HISTORY — DX: Migraine, unspecified, not intractable, without status migrainosus: G43.909

## 2014-01-07 HISTORY — PX: LEFT HEART CATHETERIZATION WITH CORONARY ANGIOGRAM: SHX5451

## 2014-01-07 HISTORY — DX: Angina pectoris, unspecified: I20.9

## 2014-01-07 HISTORY — DX: Other specified abnormal findings of blood chemistry: R79.89

## 2014-01-07 LAB — GLUCOSE, CAPILLARY
GLUCOSE-CAPILLARY: 323 mg/dL — AB (ref 70–99)
Glucose-Capillary: 291 mg/dL — ABNORMAL HIGH (ref 70–99)
Glucose-Capillary: 194 mg/dL — ABNORMAL HIGH (ref 70–99)
Glucose-Capillary: 268 mg/dL — ABNORMAL HIGH (ref 70–99)

## 2014-01-07 LAB — CBC
HEMATOCRIT: 38.9 % (ref 36.0–46.0)
HEMOGLOBIN: 13.6 g/dL (ref 12.0–15.0)
MCH: 29.8 pg (ref 26.0–34.0)
MCHC: 35 g/dL (ref 30.0–36.0)
MCV: 85.3 fL (ref 78.0–100.0)
Platelets: 145 10*3/uL — ABNORMAL LOW (ref 150–400)
RBC: 4.56 MIL/uL (ref 3.87–5.11)
RDW: 13.1 % (ref 11.5–15.5)
WBC: 8.8 10*3/uL (ref 4.0–10.5)

## 2014-01-07 LAB — TROPONIN I

## 2014-01-07 LAB — CREATININE, SERUM
Creatinine, Ser: 0.91 mg/dL (ref 0.50–1.10)
GFR calc Af Amer: 75 mL/min — ABNORMAL LOW (ref >90)
GFR, EST NON AFRICAN AMERICAN: 65 mL/min — AB (ref >90)

## 2014-01-07 LAB — D-DIMER, QUANTITATIVE (NOT AT ARMC): D DIMER QUANT: 0.71 ug{FEU}/mL — AB (ref 0.00–0.48)

## 2014-01-07 LAB — D-DIMER, QUANTITATIVE: D-Dimer, Quant: 0.61 ug/mL-FEU — ABNORMAL HIGH (ref 0.00–0.48)

## 2014-01-07 SURGERY — LEFT HEART CATHETERIZATION WITH CORONARY ANGIOGRAM
Anesthesia: LOCAL

## 2014-01-07 MED ORDER — DIAZEPAM 5 MG PO TABS
5.0000 mg | ORAL_TABLET | ORAL | Status: AC
  Administered 2014-01-07: 5 mg via ORAL
  Filled 2014-01-07: qty 1

## 2014-01-07 MED ORDER — HEPARIN SODIUM (PORCINE) 5000 UNIT/ML IJ SOLN
5000.0000 [IU] | Freq: Three times a day (TID) | INTRAMUSCULAR | Status: DC
  Filled 2014-01-07 (×6): qty 1

## 2014-01-07 MED ORDER — EZETIMIBE 10 MG PO TABS
10.0000 mg | ORAL_TABLET | Freq: Every day | ORAL | Status: DC
  Administered 2014-01-08: 10:00:00 10 mg via ORAL
  Filled 2014-01-07: qty 1

## 2014-01-07 MED ORDER — HEPARIN (PORCINE) IN NACL 100-0.45 UNIT/ML-% IJ SOLN
1050.0000 [IU]/h | INTRAMUSCULAR | Status: DC
  Administered 2014-01-07: 1050 [IU]/h via INTRAVENOUS
  Filled 2014-01-07 (×2): qty 250

## 2014-01-07 MED ORDER — ACETAMINOPHEN 325 MG PO TABS: 650.0000 mg | ORAL_TABLET | ORAL | Status: DC | PRN

## 2014-01-07 MED ORDER — POTASSIUM CHLORIDE CRYS ER 20 MEQ PO TBCR
20.0000 meq | EXTENDED_RELEASE_TABLET | Freq: Every day | ORAL | Status: DC
  Administered 2014-01-08: 10:00:00 20 meq via ORAL
  Filled 2014-01-07: qty 1

## 2014-01-07 MED ORDER — NITROGLYCERIN IN D5W 200-5 MCG/ML-% IV SOLN: 2.0000 ug/min | INTRAVENOUS | Status: DC

## 2014-01-07 MED ORDER — HEPARIN SODIUM (PORCINE) 1000 UNIT/ML IJ SOLN
INTRAMUSCULAR | Status: AC
  Filled 2014-01-07: qty 1

## 2014-01-07 MED ORDER — MORPHINE SULFATE 2 MG/ML IJ SOLN
INTRAMUSCULAR | Status: AC
  Administered 2014-01-07: 2 mg via INTRAVENOUS
  Filled 2014-01-07: qty 1

## 2014-01-07 MED ORDER — NITROGLYCERIN 0.4 MG SL SUBL
SUBLINGUAL_TABLET | SUBLINGUAL | Status: AC
  Administered 2014-01-07 (×2): 0.4 mg
  Filled 2014-01-07: qty 1

## 2014-01-07 MED ORDER — SODIUM CHLORIDE 0.9 % IV SOLN: 250.0000 mL | INTRAVENOUS | Status: DC | PRN

## 2014-01-07 MED ORDER — ONDANSETRON HCL 4 MG/2ML IJ SOLN: 4.0000 mg | Freq: Four times a day (QID) | INTRAMUSCULAR | Status: DC | PRN

## 2014-01-07 MED ORDER — LISINOPRIL 20 MG PO TABS
20.0000 mg | ORAL_TABLET | Freq: Two times a day (BID) | ORAL | Status: DC
  Administered 2014-01-08: 20 mg via ORAL
  Filled 2014-01-07 (×2): qty 1

## 2014-01-07 MED ORDER — DIPHENHYDRAMINE HCL 50 MG/ML IJ SOLN
INTRAMUSCULAR | Status: AC
  Filled 2014-01-07: qty 1

## 2014-01-07 MED ORDER — PANTOPRAZOLE SODIUM 40 MG PO TBEC
40.0000 mg | DELAYED_RELEASE_TABLET | Freq: Every day | ORAL | Status: DC
  Administered 2014-01-08: 40 mg via ORAL
  Filled 2014-01-07: qty 1

## 2014-01-07 MED ORDER — ATENOLOL 50 MG PO TABS
50.0000 mg | ORAL_TABLET | Freq: Two times a day (BID) | ORAL | Status: DC
  Administered 2014-01-07 – 2014-01-08 (×2): 50 mg via ORAL
  Filled 2014-01-07 (×3): qty 1

## 2014-01-07 MED ORDER — LIDOCAINE HCL (PF) 1 % IJ SOLN
INTRAMUSCULAR | Status: AC
  Filled 2014-01-07: qty 30

## 2014-01-07 MED ORDER — CANAGLIFLOZIN 300 MG PO TABS
300.0000 mg | ORAL_TABLET | Freq: Every day | ORAL | Status: DC
  Administered 2014-01-08: 300 mg via ORAL
  Filled 2014-01-07 (×3): qty 1

## 2014-01-07 MED ORDER — ATORVASTATIN CALCIUM 80 MG PO TABS
80.0000 mg | ORAL_TABLET | Freq: Every day | ORAL | Status: DC
Start: 2014-01-08 — End: 2014-01-08
  Filled 2014-01-07: qty 1

## 2014-01-07 MED ORDER — MORPHINE SULFATE 2 MG/ML IJ SOLN: 2.0000 mg | Freq: Once | INTRAMUSCULAR | Status: DC

## 2014-01-07 MED ORDER — METHYLPREDNISOLONE SODIUM SUCC 125 MG IJ SOLR
INTRAMUSCULAR | Status: AC
Start: 2014-01-07 — End: 2014-01-07
  Filled 2014-01-07: qty 2

## 2014-01-07 MED ORDER — NITROGLYCERIN 0.2 MG/ML ON CALL CATH LAB
INTRAVENOUS | Status: AC
  Filled 2014-01-07: qty 1

## 2014-01-07 MED ORDER — CITALOPRAM HYDROBROMIDE 20 MG PO TABS
20.0000 mg | ORAL_TABLET | Freq: Every day | ORAL | Status: DC
  Administered 2014-01-07 – 2014-01-08 (×2): 20 mg via ORAL
  Filled 2014-01-07 (×3): qty 1

## 2014-01-07 MED ORDER — MIDAZOLAM HCL 2 MG/2ML IJ SOLN
INTRAMUSCULAR | Status: AC
  Filled 2014-01-07: qty 2

## 2014-01-07 MED ORDER — HEPARIN (PORCINE) IN NACL 2-0.9 UNIT/ML-% IJ SOLN
INTRAMUSCULAR | Status: AC
  Filled 2014-01-07: qty 1500

## 2014-01-07 MED ORDER — ASPIRIN 81 MG PO CHEW: 81.0000 mg | CHEWABLE_TABLET | ORAL | Status: DC

## 2014-01-07 MED ORDER — ZOLPIDEM TARTRATE 5 MG PO TABS
5.0000 mg | ORAL_TABLET | Freq: Every evening | ORAL | Status: DC | PRN
  Administered 2014-01-07: 21:00:00 10 mg via ORAL
  Filled 2014-01-07: qty 2

## 2014-01-07 MED ORDER — NITROGLYCERIN 0.4 MG SL SUBL
SUBLINGUAL_TABLET | SUBLINGUAL | Status: AC
  Filled 2014-01-07: qty 1

## 2014-01-07 MED ORDER — SODIUM CHLORIDE 0.9 % IJ SOLN: 3.0000 mL | INTRAMUSCULAR | Status: DC | PRN

## 2014-01-07 MED ORDER — SODIUM CHLORIDE 0.9 % IV SOLN
INTRAVENOUS | Status: DC
  Administered 2014-01-07: 14:00:00 via INTRAVENOUS

## 2014-01-07 MED ORDER — CANAGLIFLOZIN 300 MG PO TABS: 300.0000 mg | ORAL_TABLET | Freq: Every day | ORAL | Status: DC

## 2014-01-07 MED ORDER — ALPRAZOLAM 0.5 MG PO TABS: 0.5000 mg | ORAL_TABLET | Freq: Three times a day (TID) | ORAL | Status: DC | PRN

## 2014-01-07 MED ORDER — GABAPENTIN 300 MG PO CAPS
300.0000 mg | ORAL_CAPSULE | Freq: Every day | ORAL | Status: DC | PRN
  Filled 2014-01-07: qty 1

## 2014-01-07 MED ORDER — FENTANYL CITRATE 0.05 MG/ML IJ SOLN
INTRAMUSCULAR | Status: AC
  Filled 2014-01-07: qty 2

## 2014-01-07 MED ORDER — VERAPAMIL HCL 2.5 MG/ML IV SOLN
INTRAVENOUS | Status: AC
  Filled 2014-01-07: qty 2

## 2014-01-07 MED ORDER — INSULIN PUMP
1.0000 | SUBCUTANEOUS | Status: DC
  Filled 2014-01-07: qty 1

## 2014-01-07 MED ORDER — SODIUM CHLORIDE 0.9 % IV SOLN: INTRAVENOUS | Status: AC

## 2014-01-07 MED ORDER — ASPIRIN 325 MG PO TABS
325.0000 mg | ORAL_TABLET | Freq: Every day | ORAL | Status: DC
  Administered 2014-01-08: 325 mg via ORAL
  Filled 2014-01-07: qty 1

## 2014-01-07 MED ORDER — NITROGLYCERIN 0.4 MG SL SUBL
0.4000 mg | SUBLINGUAL_TABLET | SUBLINGUAL | Status: DC | PRN
  Filled 2014-01-07: qty 25

## 2014-01-07 MED ORDER — SODIUM CHLORIDE 0.9 % IJ SOLN: 3.0000 mL | Freq: Two times a day (BID) | INTRAMUSCULAR | Status: DC

## 2014-01-07 MED ORDER — CLOPIDOGREL BISULFATE 75 MG PO TABS
75.0000 mg | ORAL_TABLET | Freq: Every day | ORAL | Status: DC
  Administered 2014-01-07 – 2014-01-08 (×2): 75 mg via ORAL
  Filled 2014-01-07 (×2): qty 1

## 2014-01-07 MED ORDER — FAMOTIDINE IN NACL 20-0.9 MG/50ML-% IV SOLN
INTRAVENOUS | Status: AC
  Filled 2014-01-07: qty 50

## 2014-01-07 MED ORDER — CANAGLIFLOZIN 100 MG PO TABS
300.0000 mg | ORAL_TABLET | Freq: Every day | ORAL | Status: DC
  Filled 2014-01-07: qty 3

## 2014-01-07 NOTE — H&P (View-Only) (Signed)
HPI: [name] is a 63 year old patient of Dr. Domenic Polite were following post hospitalization where she was admitted to [facility] in the setting of recurrent chest pain with known history of hypertension, CAD, and hyperlipidemia appear patient was ruled out for ACS. Stress test recently completed in October 2014 was found to be normal. She does now wish to pursue any further inpatient evaluation. No medications were changed. She is here for posthospitalization followup.    She comes today with recurrent pain in her similar to her anginal equivalent. Pain between the shoulder blades some dizziness and shortness of breath, and left arm numbness. She is taking NTG several times a week, despite use of nitrates. She also has a history of anxiety.       Allergies  Allergen Reactions  . Codeine Nausea And Vomiting  . Iohexol      Desc: pt had syncopal episode with nausea post IV CM late 1990's,  pt has had prednisone prep with heart caths x 2 without problem  kdean 04/16/07, Onset Date: [id number]   . Ticlid [Ticlopidine Hcl] Nausea And Vomiting    Current Outpatient Prescriptions  Medication Sig Dispense Refill  . ALPRAZolam (XANAX) 0.5 MG tablet Take 1 tablet (0.5 mg total) by mouth 3 (three) times daily as needed. For anxiety  90 tablet  1  . aspirin 325 MG tablet Take 325 mg by mouth daily.        Marland Kitchen atenolol (TENORMIN) 50 MG tablet Take 1 tablet (50 mg total) by mouth 2 (two) times daily.  180 tablet  1  . Canagliflozin 300 MG TABS Take 1 tablet (300 mg total) by mouth daily.  30 tablet  2  . citalopram (CELEXA) 20 MG tablet Take 1 tablet (20 mg total) by mouth daily.  30 tablet  3  . clopidogrel (PLAVIX) 75 MG tablet Take 1 tablet (75 mg total) by mouth daily.  90 tablet  1  . esomeprazole (NEXIUM) 40 MG capsule Take 1 capsule (40 mg total) by mouth daily as needed. Indigestion  90 capsule  4  . ezetimibe (ZETIA) 10 MG tablet Take 1 tablet (10 mg total) by mouth daily.  90 tablet  4    . furosemide (LASIX) 40 MG tablet Take 1 tablet (40 mg total) by mouth 2 (two) times daily.  180 tablet  1  . gabapentin (NEURONTIN) 300 MG capsule Take 1 capsule (300 mg total) by mouth daily as needed. Leg Pain  90 capsule  4  . Insulin Human (INSULIN PUMP) SOLN As directed      . insulin lispro (HUMALOG) 100 UNIT/ML injection Insulin used in pump - average daily insulin amount is 100 units.  20 mL  0  . isosorbide mononitrate (IMDUR) 120 MG 24 hr tablet Take 1 tablet (120 mg total) by mouth daily.  30 tablet  11  . lisinopril (PRINIVIL,ZESTRIL) 20 MG tablet Take 1 tablet (20 mg total) by mouth 2 (two) times daily.  90 tablet  4  . nitroGLYCERIN (NITROSTAT) 0.4 MG SL tablet Place 1 tablet (0.4 mg total) under the tongue every 5 (five) minutes as needed. For chest pain  30 tablet  11  . potassium chloride SA (K-DUR,KLOR-CON) 20 MEQ tablet Take 1 tablet (20 mEq total) by mouth daily.  90 tablet  1  . rosuvastatin (CRESTOR) 40 MG tablet Take 1 tablet (40 mg total) by mouth daily.  90 tablet  4  . Vitamin D, Ergocalciferol, (DRISDOL) 50000 UNITS  CAPS capsule Take 1 capsule (50,000 Units total) by mouth every 7 (seven) days. Friday  12 capsule  4  . zolpidem (AMBIEN) 10 MG tablet For sleep,1/2 to 1 prn at bedtime  90 tablet  1   No current facility-administered medications for this visit.    Past Medical History  Diagnosis Date  . Diverticulitis, colon   . MS (multiple sclerosis)     Not confirmed  . Depression   . PVD (peripheral vascular disease)   . HLD (hyperlipidemia)   . Essential hypertension, benign   . CAD (coronary artery disease)     DES to circumflex 02/2007, BMS to LAD and PTCA diagonal 03/2007  . Carotid artery plaque     Mild  . IDDM (insulin dependent diabetes mellitus)   . GERD (gastroesophageal reflux disease)   . NSTEMI (non-ST elevated myocardial infarction)     02/2007  . Prolapse of uterus   . TIA (transient ischemic attack)   . PAT (paroxysmal atrial tachycardia)    . Hemiplegic migraine   . Anxiety   . History of pneumonia     Past Surgical History  Procedure Laterality Date  . Appendectomy    . Cholecystectomy    . Partial hysterectomy  1986    ovaries remain - prolaspe uterus   . Breast lumpectomy      Dr. Charlynne Pander   . Colonoscopy  2002    Dr. Anwar--> Severe diverticular changes in the region of the sigmoid and descending colon with scattered diverticular changes throughout the rest of the colon. No polyps, ulcerations. Despite numerous manipulations, the tip of the scope could not be tipped into the cecal area.  . Colonoscopy  01/10/2012    Procedure: COLONOSCOPY;  Surgeon: Daneil Dolin, MD;  Location: [facility];  Service: Endoscopy;  Laterality: N/A;  1:55    ROS: Review of systems complete and found to be negative unless listed above  PHYSICAL EXAM BP 135/71  Pulse 77  Ht 5\' 6"  (1.676 m)  Wt 173 lb (78.472 kg)  BMI 27.94 kg/m2  General: Well developed, well nourished, in no acute distress, obese.  Head: Eyes PERRLA, No xanthomas.   Normal cephalic and atramatic  Lungs: Clear bilaterally to auscultation and percussion. Heart: HRRR S1 S2, without MRG.  Pulses are 2+ & equal.            No carotid bruit. No JVD.  No abdominal bruits. No femoral bruits. Abdomen: Bowel sounds are positive, abdomen soft and non-tender without masses or                  Hernia's noted. Msk:  Back normal, normal gait. Normal strength and tone for age. Extremities: No clubbing, cyanosis or edema.  DP +1 Neuro: Alert and oriented X 3. Psych:  Good affect, responds appropriately   EKG:  NSR with inferior and anterior Q waves.   ASSESSMENT AND PLAN

## 2014-01-07 NOTE — Progress Notes (Signed)
ANTICOAGULATION CONSULT NOTE - Initial Consult  Pharmacy Consult for Heparin Indication: R/O VTE  Allergies  Allergen Reactions  . Codeine Nausea And Vomiting  . Iohexol      Desc: pt had syncopal episode with nausea post IV CM late 1990's,  pt has had prednisone prep with heart caths x 2 without problem  kdean 04/16/07, Onset Date: [id number]   . Ticlid [Ticlopidine Hcl] Nausea And Vomiting    Patient Measurements: Height: 5\' 3"  (160 cm) Weight: 173 lb (78.472 kg) IBW/kg (Calculated) : 52.4 Heparin Dosing Weight:   Vital Signs: Temp: 98 F (36.7 C) (04/16 1648) Temp src: Oral (04/16 1648) BP: 112/68 mmHg (04/16 1648) Pulse Rate: 76 (04/16 1648)  Labs:  Recent Labs  01/05/14 1714 01/07/14 1700  HGB 14.8 13.6  HCT 42.0 38.9  PLT 193 145*  APTT 28.7  --   LABPROT 12.4  --   INR 0.94  --   CREATININE 1.19* 0.91    Estimated Creatinine Clearance: 61.1 ml/min (by C-G formula based on Cr of 0.91).   Medical History: Past Medical History  Diagnosis Date  . Diverticulitis, colon   . MS (multiple sclerosis)     Not confirmed  . Depression   . PVD (peripheral vascular disease)   . HLD (hyperlipidemia)   . Essential hypertension, benign   . CAD (coronary artery disease)     DES to circumflex 02/2007, BMS to LAD and PTCA diagonal 03/2007  . Carotid artery plaque     Mild  . IDDM (insulin dependent diabetes mellitus)   . GERD (gastroesophageal reflux disease)   . NSTEMI (non-ST elevated myocardial infarction)     02/2007  . Prolapse of uterus   . TIA (transient ischemic attack)   . PAT (paroxysmal atrial tachycardia)   . Hemiplegic migraine   . Anxiety   . History of pneumonia     Medications:  Scheduled:  . [START ON 01/08/2014] aspirin  325 mg Oral Daily  . atenolol  50 mg Oral BID  . [START ON 01/08/2014] atorvastatin  80 mg Oral q1800  . Canagliflozin  300 mg Oral QAC breakfast  . citalopram  20 mg Oral Daily  . clopidogrel  75 mg Oral Daily  . [START ON  01/08/2014] ezetimibe  10 mg Oral Daily  . heparin  5,000 Units Subcutaneous 3 times per day  . [START ON 01/08/2014] lisinopril  20 mg Oral BID  . morphine  2 mg Intravenous Once  . nitroGLYCERIN      . pantoprazole  40 mg Oral Daily  . potassium chloride SA  20 mEq Oral Daily    Assessment: 63yo female with (+)chest pressure & (+)D-dimer.  She is s/p cath today with (-)obstructive CAD, to start heparin ~2015 (4hr after sheath pulled).  She was on no Coumadin nor NOAC pta.  Hg is wnl, pltc 145.  Goal of Therapy:  Heparin level 0.3-0.7 units/ml Monitor platelets by anticoagulation protocol: Yes   Plan:  1-  Heparin 1050 units/hr 2-  Check HL 8hr 3-  Daily HL, CBC 4-  Watch for s/s of bleeding and plans for oral anticoagulant  Gracy Bruins, PharmD [facility]

## 2014-01-07 NOTE — Progress Notes (Signed)
At reassessment pt states that her chest pressure is now a 7 out of 10.  Will continue to monitor closely

## 2014-01-07 NOTE — Progress Notes (Signed)
Pts chest pressure is still at a 7 out of 10.  Dr Marigene Ehlers has given orders for 2 mg of morphine and 0.4mg  of nitro sublingual.  Pts Bp is 125/68 pulse is 76 and 98% RA.  Will continue to monitor closely

## 2014-01-07 NOTE — Progress Notes (Signed)
While we were getting pt ready she stated that she was having 9 out of 10 chest pressure  Anderson Malta with cath lab was called.  Pt was put on 2 liters of oxygen and on e sublingual nitro was given.  Pt stated that she has been having the pain since last night.  She took a nitro at home last night at 2000.  Dr Marigene Ehlers was called.   Pts blood sugar was grater than 300.  Pt has put her insulin pump back on and has bolused her self per jennifers orders.  Will continue to monitor closely

## 2014-01-07 NOTE — Progress Notes (Signed)
Patient transferred to cath lab holding on monitor and [facility].  Patient denies chest pain, but still 6/10 chest pressure.  Upon arrival to cath lab, Dr. Aundra Dubin and Edsel Petrin., PA were at the bedside.

## 2014-01-07 NOTE — CV Procedure (Signed)
    Cardiac Catheterization Procedure Note  Name: [name] MRN: [id number] DOB: 1950-12-02  Procedure: Left Heart Cath, Selective Coronary Angiography, LV angiography  Indication: Unstable angina, history of CAD.    Procedural Details: The right wrist was prepped, draped, and anesthetized with 1% lidocaine. Using the modified Seldinger technique, a 5 French sheath was introduced into the right radial artery. 3 mg of verapamil was administered through the sheath, weight-based unfractionated heparin was administered intravenously. Standard Judkins catheters were used for selective coronary angiography and left ventriculography. Catheter exchanges were performed over an exchange length guidewire. There were no immediate procedural complications. A TR band was used for radial hemostasis at the completion of the procedure.  The patient was transferred to the post catheterization recovery area for further monitoring.  Procedural Findings: Hemodynamics: AO 140/57 LV 136/11  Coronary angiography: Coronary dominance: right  Left mainstem: Short, no significant disease.   Left anterior descending (LAD): There was a large D1 with 30% proximal stenosis.  30% stenosis in LAD just after D1.  Stent in proximal LAD after D1 with minimal in-stent restenosis.    Left circumflex (LCx): 30% ostial LCx stenosis.  Mid LCx stent with minimal in-stent restenosis.  Small ramus and large PLOM.  Diffuse mild luminals in PLOM.   Right coronary artery (RCA): 30% mid RCA stenosis.   Left ventriculography: Vigorous LV systolic function, EF 74-94%.  No regional wall motion abnormalities.  No MR.   Final Conclusions:  No obstructive CAD.  Vigorous EF.  I do not have a good explanation for her chest pressure. I will send a D dimer.  I am going to admit her overnight for observation.   Larey Dresser 01/07/2014, 4:34 PM

## 2014-01-07 NOTE — Interval H&P Note (Signed)
Cath Lab Visit (complete for each Cath Lab visit)  Clinical Evaluation Leading to the Procedure:   ACS: no  Non-ACS:    Anginal Classification: CCS IV  Anti-ischemic medical therapy: Minimal Therapy (1 class of medications)  Non-Invasive Test Results: No non-invasive testing performed  Prior CABG: No previous CABG      History and Physical Interval Note:  01/07/2014 3:43 PM  [name]  has presented today for surgery, with the diagnosis of Tia/Chest pain  The various methods of treatment have been discussed with the patient and family. After consideration of risks, benefits and other options for treatment, the patient has consented to  Procedure(s): LEFT HEART CATHETERIZATION WITH CORONARY ANGIOGRAM (N/A) as a surgical intervention .  The patient's history has been reviewed, patient examined, no change in status, stable for surgery.  I have reviewed the patient's chart and labs.  Questions were answered to the patient's satisfaction.     [facility] Claris Gladden

## 2014-01-07 NOTE — H&P (Signed)
Cardiologist: Domenic Polite.     [name] is an 62 y.o. female.   Chief Complaint:  Chest pressure HPI:   [name] is a 63 year old patient of Dr. Domenic Polite who was admitted to [facility] in the setting of recurrent chest pain with known history of hypertension, CAD, and hyperlipidemia appear patient was ruled out for ACS. Stress test recently completed in October 2014 was found to be normal.   She was  Seen in the [facility] ER on April 8 and ruled out for MI.  She was then seen in the clinic on April 14 with recurrent pain in her similar to her anginal equivalent. Pain between the shoulder blades some dizziness and shortness of breath, and left arm numbness. She is taking NTG several times a week, despite use of nitrates. She also has a history of anxiety.   She presented for a left heart cath today and while in Short Stay developed severe chest pain/pressure.  The intensity was reduced with two SL NTG.  She reports diaphoresis, SOB, dizziness, nausea.  The patient currently denies vomiting, fever, orthopnea, PND, cough, congestion, abdominal pain, hematochezia, melena, lower extremity edema.  EKG does not show any acute changes.  She was taken to the cath ab holding area.  Medications: Prior to Admission medications   Medication Sig Start Date End Date Taking? Authorizing Provider  ALPRAZolam Duanne Moron) 0.5 MG tablet Take 1 tablet (0.5 mg total) by mouth 3 (three) times daily as needed. For anxiety 12/03/13  Yes Chipper Herb, MD  aspirin 325 MG tablet Take 325 mg by mouth daily.     Yes Historical Provider, MD  atenolol (TENORMIN) 50 MG tablet Take 1 tablet (50 mg total) by mouth 2 (two) times daily. 10/29/13  Yes Chipper Herb, MD  Canagliflozin 300 MG TABS Take 300 mg by mouth daily.   Yes Historical Provider, MD  clopidogrel (PLAVIX) 75 MG tablet Take 1 tablet (75 mg total) by mouth daily. 10/29/13  Yes Chipper Herb, MD  esomeprazole (NEXIUM) 40 MG capsule Take 1 capsule (40 mg total) by  mouth daily as needed. Indigestion 06/11/13  Yes Chipper Herb, MD  ezetimibe (ZETIA) 10 MG tablet Take 1 tablet (10 mg total) by mouth daily. 06/11/13  Yes Chipper Herb, MD  furosemide (LASIX) 40 MG tablet Take 1 tablet (40 mg total) by mouth 2 (two) times daily. 10/29/13  Yes Chipper Herb, MD  Insulin Human (INSULIN PUMP) SOLN Inject 1 each into the skin continuous. Humalog (approx 100 units daily)   Yes Historical Provider, MD  isosorbide mononitrate (IMDUR) 120 MG 24 hr tablet Take 1 tablet (120 mg total) by mouth daily. 06/11/13  Yes Chipper Herb, MD  nitroGLYCERIN (NITROSTAT) 0.4 MG SL tablet Place 1 tablet (0.4 mg total) under the tongue every 5 (five) minutes as needed. For chest pain 06/11/13  Yes Chipper Herb, MD  potassium chloride SA (K-DUR,KLOR-CON) 20 MEQ tablet Take 1 tablet (20 mEq total) by mouth daily. 12/23/13  Yes Chipper Herb, MD  rosuvastatin (CRESTOR) 40 MG tablet Take 1 tablet (40 mg total) by mouth daily. 06/11/13  Yes Chipper Herb, MD  zolpidem (AMBIEN) 10 MG tablet Take 5-10 mg by mouth at bedtime as needed for sleep.   Yes Historical Provider, MD  citalopram (CELEXA) 20 MG tablet Take 1 tablet (20 mg total) by mouth daily. 04/20/13   Chipper Herb, MD  gabapentin (NEURONTIN) 300 MG capsule Take  1 capsule (300 mg total) by mouth daily as needed. Leg Pain 06/11/13   Chipper Herb, MD  lisinopril (PRINIVIL,ZESTRIL) 20 MG tablet Take 1 tablet (20 mg total) by mouth 2 (two) times daily. 06/11/13   Chipper Herb, MD  Vitamin D, Ergocalciferol, (DRISDOL) 50000 UNITS CAPS capsule Take 1 capsule (50,000 Units total) by mouth every 7 (seven) days. Friday 06/11/13   Chipper Herb, MD     Past Medical History  Diagnosis Date  . Diverticulitis, colon   . MS (multiple sclerosis)     Not confirmed  . Depression   . PVD (peripheral vascular disease)   . HLD (hyperlipidemia)   . Essential hypertension, benign   . CAD (coronary artery disease)     DES to circumflex 02/2007, BMS  to LAD and PTCA diagonal 03/2007  . Carotid artery plaque     Mild  . IDDM (insulin dependent diabetes mellitus)   . GERD (gastroesophageal reflux disease)   . NSTEMI (non-ST elevated myocardial infarction)     02/2007  . Prolapse of uterus   . TIA (transient ischemic attack)   . PAT (paroxysmal atrial tachycardia)   . Hemiplegic migraine   . Anxiety   . History of pneumonia     Past Surgical History  Procedure Laterality Date  . Appendectomy    . Cholecystectomy    . Partial hysterectomy  1986    ovaries remain - prolaspe uterus   . Breast lumpectomy      Dr. Charlynne Pander   . Colonoscopy  2002    Dr. Anwar--> Severe diverticular changes in the region of the sigmoid and descending colon with scattered diverticular changes throughout the rest of the colon. No polyps, ulcerations. Despite numerous manipulations, the tip of the scope could not be tipped into the cecal area.  . Colonoscopy  01/10/2012    Procedure: COLONOSCOPY;  Surgeon: Daneil Dolin, MD;  Location: [facility];  Service: Endoscopy;  Laterality: N/A;  1:55    Family History  Problem Relation Age of Onset  . Heart attack Mother 24  . Diabetes Father   . Heart attack Father 28  . Heart attack Brother 30    x6  . Colon cancer Paternal Aunt     70s, died with brain anuerysm  . Crohn's disease Cousin     paternal   Social History:  reports that she has never smoked. She does not have any smokeless tobacco history on file. She reports that she does not drink alcohol or use illicit drugs.  Allergies:  Allergies  Allergen Reactions  . Codeine Nausea And Vomiting  . Iohexol      Desc: pt had syncopal episode with nausea post IV CM late 1990's,  pt has had prednisone prep with heart caths x 2 without problem  kdean 04/16/07, Onset Date: [id number]   . Ticlid [Ticlopidine Hcl] Nausea And Vomiting    Medications Prior to Admission  Medication Sig Dispense Refill  . ALPRAZolam (XANAX) 0.5 MG tablet Take 1 tablet (0.5  mg total) by mouth 3 (three) times daily as needed. For anxiety  90 tablet  1  . aspirin 325 MG tablet Take 325 mg by mouth daily.        Marland Kitchen atenolol (TENORMIN) 50 MG tablet Take 1 tablet (50 mg total) by mouth 2 (two) times daily.  180 tablet  1  . Canagliflozin 300 MG TABS Take 300 mg by mouth daily.      Marland Kitchen  clopidogrel (PLAVIX) 75 MG tablet Take 1 tablet (75 mg total) by mouth daily.  90 tablet  1  . esomeprazole (NEXIUM) 40 MG capsule Take 1 capsule (40 mg total) by mouth daily as needed. Indigestion  90 capsule  4  . ezetimibe (ZETIA) 10 MG tablet Take 1 tablet (10 mg total) by mouth daily.  90 tablet  4  . furosemide (LASIX) 40 MG tablet Take 1 tablet (40 mg total) by mouth 2 (two) times daily.  180 tablet  1  . Insulin Human (INSULIN PUMP) SOLN Inject 1 each into the skin continuous. Humalog (approx 100 units daily)      . isosorbide mononitrate (IMDUR) 120 MG 24 hr tablet Take 1 tablet (120 mg total) by mouth daily.  30 tablet  11  . nitroGLYCERIN (NITROSTAT) 0.4 MG SL tablet Place 1 tablet (0.4 mg total) under the tongue every 5 (five) minutes as needed. For chest pain  30 tablet  11  . potassium chloride SA (K-DUR,KLOR-CON) 20 MEQ tablet Take 1 tablet (20 mEq total) by mouth daily.  90 tablet  1  . rosuvastatin (CRESTOR) 40 MG tablet Take 1 tablet (40 mg total) by mouth daily.  90 tablet  4  . zolpidem (AMBIEN) 10 MG tablet Take 5-10 mg by mouth at bedtime as needed for sleep.      . citalopram (CELEXA) 20 MG tablet Take 1 tablet (20 mg total) by mouth daily.  30 tablet  3  . gabapentin (NEURONTIN) 300 MG capsule Take 1 capsule (300 mg total) by mouth daily as needed. Leg Pain  90 capsule  4  . lisinopril (PRINIVIL,ZESTRIL) 20 MG tablet Take 1 tablet (20 mg total) by mouth 2 (two) times daily.  90 tablet  4  . Vitamin D, Ergocalciferol, (DRISDOL) 50000 UNITS CAPS capsule Take 1 capsule (50,000 Units total) by mouth every 7 (seven) days. Friday  12 capsule  4    Results for orders placed  during the hospital encounter of 01/07/14 (from the past 48 hour(s))  GLUCOSE, CAPILLARY     Status: Abnormal   Collection Time    01/07/14  1:36 PM      Result Value Ref Range   Glucose-Capillary 323 (*) 70 - 99 mg/dL   Comment 1 Notify RN     Comment 2 Documented in Chart    GLUCOSE, CAPILLARY     Status: Abnormal   Collection Time    01/07/14  2:52 PM      Result Value Ref Range   Glucose-Capillary 291 (*) 70 - 99 mg/dL   No results found.  Review of Systems  Constitutional: Positive for diaphoresis.  HENT: Negative for congestion.   Respiratory: Positive for shortness of breath.   Cardiovascular: Positive for chest pain. Negative for orthopnea, leg swelling and PND.  Gastrointestinal: Positive for nausea. Negative for vomiting.  Musculoskeletal: Negative for myalgias.  Neurological: Positive for dizziness.  All other systems reviewed and are negative.   Blood pressure 124/67, pulse 76, temperature 97.6 F (36.4 C), temperature source Oral, resp. rate 18, height 5\' 3"  (1.6 m), weight 173 lb (78.472 kg), SpO2 98.00%. Physical Exam  Constitutional: She is oriented to person, place, and time. She appears well-developed. No distress.  Obese  HENT:  Head: Normocephalic and atraumatic.  Eyes: EOM are normal. Pupils are equal, round, and reactive to light.  Neck: Normal range of motion.  Cardiovascular: Normal rate, regular rhythm, S1 normal and S2 normal.   No murmur heard.  Pulses:      Radial pulses are 2+ on the right side, and 2+ on the left side.       Dorsalis pedis pulses are 2+ on the right side, and 2+ on the left side.  Respiratory: Effort normal and breath sounds normal. She has no wheezes.  GI: Soft. Bowel sounds are normal.  Musculoskeletal: She exhibits no edema.  Neurological: She is alert and oriented to person, place, and time. She exhibits normal muscle tone.  Skin: Skin is warm.  Diaphoretic  Psychiatric: She has a normal mood and affect.      Assessment/Plan Principal Problem:   Chest pain The patient was taken to the cath lab holding area and will undergo LHC very soon.  IV heparin and IV NTG were ordered.  There were no acute changes on EKG.  Will cycle cardiac enzymes.     Active Problems:   HYPERLIPIDEMIA  Zetia, Crestor   CAD, NATIVE VESSEL   ASA, plavix, Imdur 120, atenolol 50 bid,    Diabetes type 2, uncontrolled  On an insulin pump.   Essential hypertension, benign  Bp well controlled.  Alos on ACE-I   GERD (gastroesophageal reflux disease)  Add protonix   Generalized anxiety disorder     Tarri Fuller 01/07/2014, 3:23 PM  Patient seen with PA, agree with the above note.  Cardiac cath done, showing no obstructive CAD (patent stents) and vigorous LV systolic function.  I do not have a good explanation for her chest pain. Family says she has been under a lot of stress since her uncle died 2 wks ago, which is when her CP began.  I am going to send a  D dimer.  If positive, she will need a V/Q scan to rule out PE.  I am going to keep her overnight for observation given ongoing chest pressure, now "4/10."    Larey Dresser 01/07/2014 4:46 PM

## 2014-01-08 ENCOUNTER — Encounter (HOSPITAL_COMMUNITY): Payer: Self-pay | Admitting: Cardiology

## 2014-01-08 ENCOUNTER — Observation Stay (HOSPITAL_COMMUNITY): Payer: Medicare HMO

## 2014-01-08 DIAGNOSIS — R7989 Other specified abnormal findings of blood chemistry: Secondary | ICD-10-CM | POA: Diagnosis present

## 2014-01-08 DIAGNOSIS — R079 Chest pain, unspecified: Secondary | ICD-10-CM

## 2014-01-08 HISTORY — DX: Other specified abnormal findings of blood chemistry: R79.89

## 2014-01-08 LAB — BASIC METABOLIC PANEL
BUN: 17 mg/dL (ref 6–23)
CO2: 22 mEq/L (ref 19–32)
Calcium: 9.6 mg/dL (ref 8.4–10.5)
Chloride: 103 mEq/L (ref 96–112)
Creatinine, Ser: 0.85 mg/dL (ref 0.50–1.10)
GFR, EST AFRICAN AMERICAN: 82 mL/min — AB (ref >90)
GFR, EST NON AFRICAN AMERICAN: 70 mL/min — AB (ref >90)
Glucose, Bld: 255 mg/dL — ABNORMAL HIGH (ref 70–99)
POTASSIUM: 4.3 meq/L (ref 3.7–5.3)
Sodium: 139 mEq/L (ref 137–147)

## 2014-01-08 LAB — CBC
HEMATOCRIT: 36.4 % (ref 36.0–46.0)
Hemoglobin: 12.6 g/dL (ref 12.0–15.0)
MCH: 29.5 pg (ref 26.0–34.0)
MCHC: 34.6 g/dL (ref 30.0–36.0)
MCV: 85.2 fL (ref 78.0–100.0)
Platelets: 135 10*3/uL — ABNORMAL LOW (ref 150–400)
RBC: 4.27 MIL/uL (ref 3.87–5.11)
RDW: 13 % (ref 11.5–15.5)
WBC: 13.3 10*3/uL — ABNORMAL HIGH (ref 4.0–10.5)

## 2014-01-08 LAB — GLUCOSE, CAPILLARY
Glucose-Capillary: 191 mg/dL — ABNORMAL HIGH (ref 70–99)
Glucose-Capillary: 259 mg/dL — ABNORMAL HIGH (ref 70–99)

## 2014-01-08 LAB — HEPARIN LEVEL (UNFRACTIONATED): Heparin Unfractionated: 0.63 IU/mL (ref 0.30–0.70)

## 2014-01-08 LAB — TROPONIN I: Troponin I: <0.3 ng/mL (ref <0.30)

## 2014-01-08 MED ORDER — ESOMEPRAZOLE MAGNESIUM 40 MG PO CPDR: 40.0000 mg | DELAYED_RELEASE_CAPSULE | Freq: Two times a day (BID) | ORAL | Status: DC

## 2014-01-08 MED ORDER — TECHNETIUM TC 99M DIETHYLENETRIAME-PENTAACETIC ACID: 40.0000 | Freq: Once | INTRAVENOUS | Status: DC | PRN

## 2014-01-08 MED ORDER — INSULIN PUMP
Freq: Three times a day (TID) | SUBCUTANEOUS | Status: DC
  Administered 2014-01-08: 13:00:00 15 via SUBCUTANEOUS
  Filled 2014-01-08: qty 1

## 2014-01-08 MED ORDER — TECHNETIUM TO 99M ALBUMIN AGGREGATED
6.0000 | Freq: Once | INTRAVENOUS | Status: AC | PRN
  Administered 2014-01-08: 12:00:00 6 via INTRAVENOUS

## 2014-01-08 NOTE — Discharge Instructions (Signed)
Call [facility] at [phone] if any bleeding, swelling or drainage at cath site.  May shower, no tub baths for 48 hours for groin sticks. No lifting over 5 pounds for 3 days, no driving for 3 days.  Heart Healthy Diabetic Diet   Increase your nexium to twice a day for 2 weeks, then back to once daily.

## 2014-01-08 NOTE — Discharge Summary (Signed)
Physician Discharge Summary       Patient ID: DENELLE CAPURRO MRN: [id number] DOB/AGE: 1951-08-08 63 y.o.  Admit date: 01/07/2014 Discharge date: 01/08/2014  Discharge Diagnoses:  Principal Problem:   Chest pain Active Problems:   CAD, NATIVE VESSEL, cath 01/07/14 non obstructive coronary disease   Elevated d-dimer, neg VQ scan   HYPERLIPIDEMIA   Diabetes type 2, uncontrolled- on insulin pump   Essential hypertension, benign   GERD (gastroesophageal reflux disease)   Generalized anxiety disorder   Unstable angina   Discharged Condition: good  Procedures: 01-07-14  Cardiac cath by Dr. Selmer Dominion Course: 63 year old patient of Dr. Domenic Polite who was admitted to [facility] in the setting of recurrent chest pain with known history of hypertension, CAD, and hyperlipidemia appear patient was ruled out for ACS. Stress test recently completed in October 2014 was found to be normal. She was Seen in the [facility] on April 8 and ruled out for MI. She was then seen in the clinic on April 14 with recurrent pain in her similar to her anginal equivalent. Pain between the shoulder blades some dizziness and shortness of breath, and left arm numbness. She is taking NTG several times a week, despite use of nitrates. She also has a history of anxiety. She presented for a left heart cath today and while in Short Stay developed severe chest pain/pressure. The intensity was reduced with two SL NTG. She reported diaphoresis, SOB, dizziness, nausea. The patient currently denies vomiting, fever, orthopnea, PND, cough, congestion, abdominal pain, hematochezia, melena, lower extremity edema. Cardiac cath was arranged from the office.    Results were non obstructive CAD.  D Dimer was drawn and elevated heparin continued post cath.  V Q scan ordered.  Negative Troponin.     Consults: None  Significant Diagnostic Studies:  BMET    Component Value Date/Time   NA 139 01/08/2014 0505   NA 141 10/19/2013  1046   K 4.3 01/08/2014 0505   CL 103 01/08/2014 0505   CO2 22 01/08/2014 0505   GLUCOSE 255* 01/08/2014 0505   GLUCOSE 180* 10/19/2013 1046   BUN 17 01/08/2014 0505   BUN 8 10/19/2013 1046   CREATININE 0.85 01/08/2014 0505   CREATININE 1.19* 01/05/2014 1714   CALCIUM 9.6 01/08/2014 0505   GFRNONAA 70* 01/08/2014 0505   GFRNONAA 63 02/05/2013 1002   GFRAA 82* 01/08/2014 0505   GFRAA 72 02/05/2013 1002    CBC    Component Value Date/Time   WBC 13.3* 01/08/2014 0505   WBC 6.3 09/14/2013 1607   RBC 4.27 01/08/2014 0505   RBC 4.8 09/14/2013 1607   HGB 12.6 01/08/2014 0505   HGB 13.6 09/14/2013 1607   HCT 36.4 01/08/2014 0505   HCT 42.5 09/14/2013 1607   PLT 135* 01/08/2014 0505   MCV 85.2 01/08/2014 0505   MCV 87.7 09/14/2013 1607   MCH 29.5 01/08/2014 0505   MCH 28.1 09/14/2013 1607   MCHC 34.6 01/08/2014 0505   MCHC 32.0 09/14/2013 1607   RDW 13.0 01/08/2014 0505   LYMPHSABS 3.1 01/05/2014 1714   MONOABS 0.4 01/05/2014 1714   EOSABS 0.1 01/05/2014 1714   BASOSABS 0.0 01/05/2014 1714    Troponin < 0.30 DDimer 0.71  VQ Scan: FINDINGS: Ventilation: Mild decreased ventilation to the left lung base.  Perfusion: There wedge-shaped peripheral perfusion defects. There is decrease regional perfusion to the left lung base which is slightly greater but matches the ventilation defect and is felt to relate  to cardiomegaly rather than pulmonary embolism.  IMPRESSION: Very low probability for acute pulmonary embolism.     Discharge Exam: Blood pressure 136/62, pulse 79, temperature 98 F (36.7 C), temperature source Oral, resp. rate 17, height 5\' 3"  (1.6 m), weight 176 lb 9.4 oz (80.1 kg), SpO2 98.00%.   Disposition: 01-Home or Self Care       Future Appointments Provider Department Dept Phone   01/22/2014 2:30 PM Lendon Colonel, NP [facility] [phone]   01/25/2014 4:00 PM Chipper Herb, MD [place] [phone]   02/08/2014 9:30 AM [facility]  Pharmacist [facility] [phone]       Medication List         ALPRAZolam 0.5 MG tablet  Commonly known as:  XANAX  Take 1 tablet (0.5 mg total) by mouth 3 (three) times daily as needed. For anxiety     aspirin 325 MG tablet  Take 325 mg by mouth daily.     atenolol 50 MG tablet  Commonly known as:  TENORMIN  Take 1 tablet (50 mg total) by mouth 2 (two) times daily.     Canagliflozin 300 MG Tabs  Take 300 mg by mouth daily.     citalopram 20 MG tablet  Commonly known as:  CELEXA  Take 1 tablet (20 mg total) by mouth daily.     clopidogrel 75 MG tablet  Commonly known as:  PLAVIX  Take 1 tablet (75 mg total) by mouth daily.     esomeprazole 40 MG capsule  Commonly known as:  NEXIUM  Take 1 capsule (40 mg total) by mouth 2 (two) times daily before a meal. Indigestion- after twice a day for tow weeks resume once a day     ezetimibe 10 MG tablet  Commonly known as:  ZETIA  Take 1 tablet (10 mg total) by mouth daily.     furosemide 40 MG tablet  Commonly known as:  LASIX  Take 1 tablet (40 mg total) by mouth 2 (two) times daily.     gabapentin 300 MG capsule  Commonly known as:  NEURONTIN  Take 1 capsule (300 mg total) by mouth daily as needed. Leg Pain     insulin pump Soln  Inject 1 each into the skin continuous. Humalog (approx 100 units daily)     isosorbide mononitrate 120 MG 24 hr tablet  Commonly known as:  IMDUR  Take 1 tablet (120 mg total) by mouth daily.     lisinopril 20 MG tablet  Commonly known as:  PRINIVIL,ZESTRIL  Take 1 tablet (20 mg total) by mouth 2 (two) times daily.     nitroGLYCERIN 0.4 MG SL tablet  Commonly known as:  NITROSTAT  Place 1 tablet (0.4 mg total) under the tongue every 5 (five) minutes as needed. For chest pain     potassium chloride SA 20 MEQ tablet  Commonly known as:  K-DUR,KLOR-CON  Take 1 tablet (20 mEq total) by mouth daily.     rosuvastatin 40 MG tablet  Commonly known as:  CRESTOR  Take  1 tablet (40 mg total) by mouth daily.     Vitamin D (Ergocalciferol) 50000 UNITS Caps capsule  Commonly known as:  DRISDOL  Take 1 capsule (50,000 Units total) by mouth every 7 (seven) days. Friday     zolpidem 10 MG tablet  Commonly known as:  AMBIEN  Take 5-10 mg by mouth at bedtime as needed for sleep.  Follow-up Information   Follow up with Jory Sims, NP On 01/22/2014. ( at 2:30 pm for follow up)    Specialty:  Nurse Practitioner   Contact information:   [place] [address] [phone]        Discharge Instructions: Call [facility] at [phone] if any bleeding, swelling or drainage at cath site.  May shower, no tub baths for 48 hours for groin sticks. No lifting over 5 pounds for 3 days, no driving for 3 days.  Heart Healthy Diabetic Diet  Take Nexium twice a day for 2 weeks then back to once a day.  Signed: [name] Nurse Practitioner-Certified [facility]: Antonieta Pert 01/08/2014, 12:21 PM  Time spent on discharge : 40 minutes.

## 2014-01-08 NOTE — Progress Notes (Addendum)
[place] for Heparin Indication: R/O VTE  Allergies  Allergen Reactions  . Codeine Nausea And Vomiting  . Iohexol      Desc: pt had syncopal episode with nausea post IV CM late 1990's,  pt has had prednisone prep with heart caths x 2 without problem  kdean 04/16/07, Onset Date: [id number]   . Ticlid [Ticlopidine Hcl] Nausea And Vomiting    Patient Measurements: Height: 5\' 3"  (160 cm) Weight: 176 lb 9.4 oz (80.1 kg) IBW/kg (Calculated) : 52.4 Heparin Dosing Weight:   Vital Signs: Temp: 97.9 F (36.6 C) (04/17 0407) Temp src: Oral (04/17 0407) BP: 127/42 mmHg (04/17 0407) Pulse Rate: 75 (04/17 0407)  Labs:  Recent Labs  01/05/14 1714 01/07/14 1700 01/07/14 2003 01/08/14 0505  HGB 14.8 13.6  --  12.6  HCT 42.0 38.9  --  36.4  PLT 193 145*  --  135*  APTT 28.7  --   --   --   LABPROT 12.4  --   --   --   INR 0.94  --   --   --   HEPARINUNFRC  --   --   --  0.63  CREATININE 1.19* 0.91  --  0.85  TROPONINI  --   --  <0.30 <0.30    Estimated Creatinine Clearance: 66.1 ml/min (by C-G formula based on Cr of 0.85).  Assessment: 63 yo female with possible PE for heparin  Goal of Therapy:  Heparin level 0.3-0.7 units/ml Monitor platelets by anticoagulation protocol: Yes   Plan:  Continue Heparin at current rate for now F/U plan for anticoagulation  Phillis Knack, PharmD, BCPS

## 2014-01-08 NOTE — Discharge Summary (Signed)
Pt Seen & examined this AM. Overnight Obs for CP eval -- cath non-obstructive. VQ Scan negative for PE.  Unclear etiology, but non-cardiac.   OK for d/c today as she is CP free.  ROV as scheduled.  Leonie Man, M.D., M.S. Interventional Cardiologist  [place] Pager # [phone] 01/08/2014

## 2014-01-08 NOTE — Progress Notes (Addendum)
Inpatient Diabetes Program Recommendations  AACE/ADA: New Consensus Statement on Inpatient Glycemic Control (2013)  Target Ranges:  Prepandial:   less than 140 mg/dL      Peak postprandial:   less than 180 mg/dL (1-2 hours)      Critically ill patients:  140 - 180 mg/dL   Reason for Visit: Insulin pump  Diabetes history: Type 2? Outpatient Diabetes medications: Insulin pump Current orders for Inpatient glycemic control: insulin pump   Note: Spoke with patient about her insulin pump.  Was diagnosed with diabetes for 15-20 years. Has been on Medtr[employer] insulin pump for 2 years.  Is seen in [facility] physician office.  Checked insulin pump basal settings: 2.55 units/hour. Total basal amount: 58.4 units.  Could not see individual hour settings in pump. CHO ratio: 1 unit/7 grams.  Sensitivity: 10 points  Blood glucose target: 110 mg/dl.  States that she takes Humalog 20 units SQ if CBGs are running 500-600 mg/dl. Just had it adjusted on Tuesday before admission.  Will continue to follow while in hospital.  Patient contract had been signed. Harvel Ricks RN BSN CDE

## 2014-01-08 NOTE — Progress Notes (Signed)
63 year old patient of Dr. Domenic Polite who was admitted to [facility] in the setting of recurrent chest pain with known history of hypertension, CAD, and hyperlipidemia appear patient was ruled out for ACS. Stress test recently completed in October 2014 was found to be normal. She was Seen in the [facility] ER on April 8 and ruled out for MI. She was then seen in the clinic on April 14 with recurrent pain in her similar to her anginal equivalent. Pain between the shoulder blades some dizziness and shortness of breath, and left arm numbness. She is taking NTG several times a week, despite use of nitrates. She also has a history of anxiety. She presented for a left heart cath today and while in Short Stay developed severe chest pain/pressure. The intensity was reduced with two SL NTG. She reports diaphoresis, SOB, dizziness, nausea. The patient currently denies vomiting, fever, orthopnea, PND, cough, congestion, abdominal pain, hematochezia, melena, lower extremity edema.  Cardiac cath  Left mainstem: Short, no significant disease.  Left anterior descending (LAD): There was a large D1 with 30% proximal stenosis. 30% stenosis in LAD just after D1. Stent in proximal LAD after D1 with minimal in-stent restenosis.  Left circumflex (LCx): 30% ostial LCx stenosis. Mid LCx stent with minimal in-stent restenosis. Small ramus and large PLOM. Diffuse mild luminals in PLOM.  Right coronary artery (RCA): 30% mid RCA stenosis.  Left ventriculography: Vigorous LV systolic function, EF 62-95%. No regional wall motion abnormalities. No MR.  Final Conclusions: No obstructive CAD. Vigorous EF. I do not have a good explanation for her chest pressure. I will send a D dimer. I am going to admit her overnight for observation    Subjective: No chest pain or SOB this am  Awaiting VQ scan  Objective: Vital signs in last 24 hours: Temp:  [97.5 F (36.4 C)-98 F (36.7 C)] 97.9 F (36.6 C) (04/17 0407) Pulse Rate:   [75-81] 75 (04/17 0407) Resp:  [17-18] 17 (04/17 0407) BP: (108-132)/(42-76) 127/42 mmHg (04/17 0407) SpO2:  [95 %-99 %] 98 % (04/17 0407) Weight:  [173 lb (78.472 kg)-176 lb 9.4 oz (80.1 kg)] 176 lb 9.4 oz (80.1 kg) (04/17 0435) Weight change:  Last BM Date: 01/08/14 Intake/Output from previous day: -80 04/16 0701 - 04/17 0700 In: 320 [P.O.:320] Out: 400 [Urine:400] Intake/Output this shift: Total I/O In: 200 [P.O.:200] Out: 400 [Urine:400]  PE: General:Pleasant affect, NAD Skin:Warm and dry, brisk capillary refill HEENT:normocephalic, sclera clear, mucus membranes moist Neck:supple, no JVD, no bruits  Heart:S1S2 RRR without murmur, gallup, rub or click Lungs:clear without rales, rhonchi, or wheezes MWU:XLKG, non tender, + BS, do not palpate liver spleen or masses Ext:no lower ext edema, 2+ pedal pulses, 2+ radial pulses, rt wrist cath site stable. Neuro:alert and oriented, MAE, follows commands, + facial symmetry   Tele: SR occ PVC Lab Results:  Recent Labs  01/07/14 1700 01/08/14 0505  WBC 8.8 13.3*  HGB 13.6 12.6  HCT 38.9 36.4  PLT 145* 135*   BMET  Recent Labs  01/05/14 1714 01/07/14 1700 01/08/14 0505  NA 141  --  139  K 4.4  --  4.3  CL 97  --  103  CO2 33*  --  22  GLUCOSE 257*  --  255*  BUN 16  --  17  CREATININE 1.19* 0.91 0.85  CALCIUM 10.3  --  9.6    Recent Labs  01/07/14 2003 01/08/14 0505  TROPONINI <0.30 <0.30  Lab Results  Component Value Date   CHOL 134 12/30/2013   HDL 40 12/30/2013   LDLCALC 66 12/30/2013   TRIG 138 12/30/2013   CHOLHDL 3.4 12/30/2013   Lab Results  Component Value Date   HGBA1C 10.0* 12/30/2013     Lab Results  Component Value Date   TSH 1.052 02/05/2013      Studies/Results: No results found.  Medications: I have reviewed the patient's current medications. Scheduled Meds: . aspirin  325 mg Oral Daily  . atenolol  50 mg Oral BID  . atorvastatin  80 mg Oral q1800  . Canagliflozin  300 mg Oral QAC  breakfast  . citalopram  20 mg Oral Daily  . clopidogrel  75 mg Oral Daily  . ezetimibe  10 mg Oral Daily  . heparin  5,000 Units Subcutaneous 3 times per day  . lisinopril  20 mg Oral BID  . morphine  2 mg Intravenous Once  . pantoprazole  40 mg Oral Daily  . potassium chloride SA  20 mEq Oral Daily   Continuous Infusions: . heparin 1,050 Units/hr (01/07/14 2021)  . insulin pump    . nitroGLYCERIN     PRN Meds:.acetaminophen, ALPRAZolam, gabapentin, nitroGLYCERIN, ondansetron (ZOFRAN) IV, zolpidem  Assessment/Plan: Principal Problem:   Chest pain- not felt to be cardiac, check VQ  If neg increase PPI for 2 weeks. Plan for discharge today if neg VQ Active Problems:   CAD, NATIVE VESSEL, cath 01/07/14 non obstructive coronary disease   Elevated d-dimer- For VQ scan today  to rule out PE with elevated d dimer   HYPERLIPIDEMIA   Diabetes type 2, uncontrolled--glucose 194-323   Essential hypertension, benign   GERD (gastroesophageal reflux disease)   Generalized anxiety disorder   Unstable angina    LOS: 1 day   Time spent with pt. :15 minutes. [name]  Nurse Practitioner Certified Pager [phone] or after 5pm and on weekends call [phone] 01/08/2014, 6:40 AM   I saw & examined the patient this AM along with Ms. Ingold, NP-C. Overnight Obs admission for CP concerning for Angina -- Cath with non-obstructive CAD; Mildly elevated D-dimer -- VQ Scan pending.  If + for PE would need to start NOAC (previous intolerance of Warfarin) -- would get CM consult to figure out insurance coverage.  May delay d/c until tomorrow AM to ensure medication tolerance.  If negative for PE --> discharge today. Continue IV Heparin until results provided.  Either way, d/c will likely be delayed beyond target time of 11 AM due to scheduling & completion + read of VQScan.  Leonie Man, MD

## 2014-01-08 NOTE — Progress Notes (Signed)
It will take over 13 hours for pt to have CTA, due to dye allergy.  Will proceed with VQ scan, I have called them and they can do this AM

## 2014-01-11 ENCOUNTER — Ambulatory Visit: Payer: Medicare Other | Admitting: Family Medicine

## 2014-01-11 ENCOUNTER — Other Ambulatory Visit: Payer: Self-pay

## 2014-01-11 ENCOUNTER — Ambulatory Visit: Payer: Self-pay

## 2014-01-14 ENCOUNTER — Ambulatory Visit: Payer: Medicare HMO | Admitting: Family Medicine

## 2014-01-22 ENCOUNTER — Ambulatory Visit: Payer: Medicare HMO | Admitting: Adult Health

## 2014-01-22 ENCOUNTER — Other Ambulatory Visit: Payer: Self-pay | Admitting: Family Medicine

## 2014-01-25 ENCOUNTER — Ambulatory Visit: Payer: Medicare HMO | Admitting: Family Medicine

## 2014-01-25 NOTE — Telephone Encounter (Signed)
Last filled 12/03/13, last seen 11/08/13. Call into [facility]

## 2014-01-25 NOTE — Telephone Encounter (Signed)
Please call in xanax with 1 refills 

## 2014-01-25 NOTE — Telephone Encounter (Signed)
Called in.

## 2014-02-02 ENCOUNTER — Telehealth: Payer: Self-pay | Admitting: Pharmacist Clinician (PhC)/ Clinical Pharmacy Specialist

## 2014-02-03 MED ORDER — INSULIN LISPRO 100 UNIT/ML ~~LOC~~ SOLN: SUBCUTANEOUS | Status: DC

## 2014-02-03 NOTE — Telephone Encounter (Signed)
Rx sent to pharmacy. Patient notified. 

## 2014-02-03 NOTE — Telephone Encounter (Signed)
Patient is completely out of insulin-patient needs rx sent to pharmacy for her pump-she called the drug store and what she has on file was only for 1 vial and that is going to cost her $45.

## 2014-02-08 ENCOUNTER — Inpatient Hospital Stay
Admission: RE | Admit: 2014-02-08 | Discharge: 2014-02-08 | Disposition: A | Discharge status: Home / Self Care | Payer: Self-pay | Source: Ambulatory Visit | Attending: Family Medicine | Admitting: Family Medicine

## 2014-02-08 ENCOUNTER — Ambulatory Visit (INDEPENDENT_AMBULATORY_CARE_PROVIDER_SITE_OTHER): Payer: Medicare HMO | Admitting: Pharmacist

## 2014-02-08 ENCOUNTER — Encounter: Payer: Self-pay | Admitting: Pharmacist

## 2014-02-08 VITALS — BP 142/78 | HR 77 | Ht 65.0 in | Wt 180.5 lb

## 2014-02-08 DIAGNOSIS — Z23 Encounter for immunization: Secondary | ICD-10-CM

## 2014-02-08 DIAGNOSIS — H269 Unspecified cataract: Secondary | ICD-10-CM | POA: Insufficient documentation

## 2014-02-08 DIAGNOSIS — Z0000 Encounter for general adult medical examination without abnormal findings: Secondary | ICD-10-CM

## 2014-02-08 DIAGNOSIS — M8580 Other specified disorders of bone density and structure, unspecified site: Secondary | ICD-10-CM

## 2014-02-08 DIAGNOSIS — H3530 Unspecified macular degeneration: Secondary | ICD-10-CM | POA: Insufficient documentation

## 2014-02-08 NOTE — Progress Notes (Signed)
Subjective:    [name] is a 63 y.o. female who presents for Medicare Initial preventive examination.  Preventive Screening-Counseling & Management  Tobacco History  Smoking status  . Passive Smoke Exposure - Never Smoker  Smokeless tobacco  . Never Used    Comment: spouse, 33 years - husband has quit 01/2011   Current Problems (verified) Patient Active Problem List   Diagnosis Date Noted  . Cataract 02/08/2014  . Macular degeneration 02/08/2014  . Elevated d-dimer, neg VQ scan 01/08/2014  . Unstable angina 01/07/2014  . Chest pain 12/30/2013  . Generalized anxiety disorder 09/14/2013  . Claudication 06/18/2013  . Diverticulosis of colon 01/01/2012  . Left lower quadrant pain 01/01/2012  . GERD (gastroesophageal reflux disease) 01/01/2012  . Constipation 01/01/2012  . Hypokalemia 08/24/2011  . Diabetes type 2, uncontrolled- on insulin pump 08/23/2011  . Essential hypertension, benign 08/23/2011  . PALPITATIONS 02/10/2010  . HYPERLIPIDEMIA 05/10/2009  . DEPRESSION 05/10/2009  . MULTIPLE SCLEROSIS 05/10/2009  . CAD, NATIVE VESSEL, cath 01/07/14 non obstructive coronary disease 08/26/2008  . 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 ALPRAZolam (XANAX) 0.5 MG tablet TAKE ONE TABLET 3 TIMES A DAY AS NEEDED.  90 tablet  1  . aspirin 325 MG tablet Take 325 mg by mouth daily.        Marland Kitchen atenolol (TENORMIN) 50 MG tablet Take 1 tablet (50 mg total) by mouth 2 (two) times daily.  180 tablet  1  . Canagliflozin 300 MG TABS Take 300 mg by mouth daily.      . citalopram (CELEXA) 20 MG tablet Take 1 tablet (20 mg total) by mouth daily.  30 tablet  3  . clopidogrel (PLAVIX) 75 MG tablet Take 1 tablet (75 mg total) by mouth daily.  90 tablet  1  . ezetimibe (ZETIA) 10 MG tablet Take 1 tablet (10 mg total) by mouth daily.  90 tablet  4  . furosemide (LASIX) 40 MG  tablet Take 1 tablet (40 mg total) by mouth 2 (two) times daily.  180 tablet  1  . gabapentin (NEURONTIN) 300 MG capsule Take 1 capsule (300 mg total) by mouth daily as needed. Leg Pain  90 capsule  4  . insulin lispro (HUMALOG) 100 UNIT/ML injection Use in insulin pump (uses up to 100 units) as directed  30 mL  2  . isosorbide mononitrate (IMDUR) 120 MG 24 hr tablet Take 1 tablet (120 mg total) by mouth daily.  30 tablet  11  . lisinopril (PRINIVIL,ZESTRIL) 20 MG tablet Take 1 tablet (20 mg total) by mouth 2 (two) times daily.  90 tablet  4  . nitroGLYCERIN (NITROSTAT) 0.4 MG SL tablet Place 1 tablet (0.4 mg total) under the tongue every 5 (five) minutes as needed. For chest pain  30 tablet  11  . potassium chloride SA (K-DUR,KLOR-CON) 20 MEQ tablet Take 1 tablet (20 mEq total) by mouth daily.  90 tablet  1  . rosuvastatin (CRESTOR) 40 MG tablet Take 1 tablet (40 mg total) by mouth daily.  90 tablet  4  . Vitamin D, Ergocalciferol, (DRISDOL) 50000 UNITS CAPS capsule Take 1 capsule (50,000 Units total) by mouth every 7 (seven) days. Friday  12 capsule  4  . zolpidem (AMBIEN) 10 MG tablet Take 5-10 mg by mouth at bedtime as needed for sleep.      Marland Kitchen  esomeprazole (NEXIUM) 40 MG capsule Take 1 capsule (40 mg total) by mouth 2 (two) times daily before a meal. Indigestion- after twice a day for tow weeks resume once a day  60 capsule  4   No current facility-administered medications on file prior to visit.    Current Medications (verified) Current Outpatient Prescriptions  Medication Sig Dispense Refill  . ALPRAZolam (XANAX) 0.5 MG tablet TAKE ONE TABLET 3 TIMES A DAY AS NEEDED.  90 tablet  1  . aspirin 325 MG tablet Take 325 mg by mouth daily.        Marland Kitchen atenolol (TENORMIN) 50 MG tablet Take 1 tablet (50 mg total) by mouth 2 (two) times daily.  180 tablet  1  . Canagliflozin 300 MG TABS Take 300 mg by mouth daily.      . citalopram (CELEXA) 20 MG tablet Take 1 tablet (20 mg total) by mouth daily.  30  tablet  3  . clopidogrel (PLAVIX) 75 MG tablet Take 1 tablet (75 mg total) by mouth daily.  90 tablet  1  . ezetimibe (ZETIA) 10 MG tablet Take 1 tablet (10 mg total) by mouth daily.  90 tablet  4  . furosemide (LASIX) 40 MG tablet Take 1 tablet (40 mg total) by mouth 2 (two) times daily.  180 tablet  1  . gabapentin (NEURONTIN) 300 MG capsule Take 1 capsule (300 mg total) by mouth daily as needed. Leg Pain  90 capsule  4  . insulin lispro (HUMALOG) 100 UNIT/ML injection Use in insulin pump (uses up to 100 units) as directed  30 mL  2  . isosorbide mononitrate (IMDUR) 120 MG 24 hr tablet Take 1 tablet (120 mg total) by mouth daily.  30 tablet  11  . lisinopril (PRINIVIL,ZESTRIL) 20 MG tablet Take 1 tablet (20 mg total) by mouth 2 (two) times daily.  90 tablet  4  . nitroGLYCERIN (NITROSTAT) 0.4 MG SL tablet Place 1 tablet (0.4 mg total) under the tongue every 5 (five) minutes as needed. For chest pain  30 tablet  11  . potassium chloride SA (K-DUR,KLOR-CON) 20 MEQ tablet Take 1 tablet (20 mEq total) by mouth daily.  90 tablet  1  . rosuvastatin (CRESTOR) 40 MG tablet Take 1 tablet (40 mg total) by mouth daily.  90 tablet  4  . Vitamin D, Ergocalciferol, (DRISDOL) 50000 UNITS CAPS capsule Take 1 capsule (50,000 Units total) by mouth every 7 (seven) days. Friday  12 capsule  4  . zolpidem (AMBIEN) 10 MG tablet Take 5-10 mg by mouth at bedtime as needed for sleep.      Marland Kitchen esomeprazole (NEXIUM) 40 MG capsule Take 1 capsule (40 mg total) by mouth 2 (two) times daily before a meal. Indigestion- after twice a day for tow weeks resume once a day  60 capsule  4   No current facility-administered medications for this visit.     Allergies (verified) Codeine; Iohexol; and Ticlid   PAST HISTORY  Family History Family History  Problem Relation Age of Onset  . Heart attack Mother 54  . Diabetes Father   . Heart attack Father 32  . Heart attack Brother 30    x6  . Colon cancer Paternal Aunt     80s,  died with brain anuerysm  . Crohn's disease Cousin     paternal    Social History History  Substance Use Topics  . Smoking status: Passive Smoke Exposure - Never Smoker  . Smokeless  tobacco: Never Used     Comment: spouse, 67 years - husband has quit 01/2011  . Alcohol Use: No     Diabetes HPI: Patient has had an insulin pump for about 1 year.   She has been following up regularly for adjustments to her pump.  She was seen about 6 weeks ago and basal rate was increased and Invokana increased to 300mg  1 tablet daily.  Patient has tried metformin regular and XR and very low dose but has not been able to tolerate in the past due to diarrhea and nausea.  Her current pump setting are:  Basal Rate: 2.10 units/hour all day  CHO ration to 7.0   Insulin Sensitivity - 10   BG goal 110  Active Insulin Time - 4 hours  Home BG Monitoring:  Checking 2 time a day. Average:  300 High: over 400   Low:  174    Are there smokers in your home (other than you)? No  Risk Factors Current exercise habits: The patient does not participate in regular exercise at present.  Dietary issues discussed: limiting high carbohydrate food, low sodium   Cardiac risk factors: advanced age (older than 64 for men, 76 for women), diabetes mellitus, dyslipidemia, hypertension and sedentary lifestyle.  Depression Screen (Note: if answer to either of the following is "Yes", a more complete depression screening is indicated)   Over the past 2 weeks, have you felt down, depressed or hopeless? No  Over the past 2 weeks, have you felt little interest or pleasure in doing things? No  Have you lost interest or pleasure in daily life? No  Do you often feel hopeless? No  Do you cry easily over simple problems? No  Activities of Daily Living In your present state of health, do you have any difficulty performing the following activities?:  Driving? No Managing money?  Yes Feeding yourself? No Getting from bed to  chair? No  Climbing a flight of stairs? Yes Preparing food and eating?: No Bathing or showering? No Getting dressed: No Getting to the toilet? No Using the toilet:No Moving around from place to place: No In the past year have you fallen or had a near fall?:Yes   Are you sexually active?  No  Do you have more than one partner?  No  Hearing Difficulties: No Do you often ask people to speak up or repeat themselves? No Do you experience ringing or noises in your ears? No Do you have difficulty understanding soft or whispered voices? No   Do you feel that you have a problem with memory? No  Do you often misplace items? No  Do you feel safe at home?  Yes  Cognitive Testing  Alert? Yes  Normal Appearance?Yes  Oriented to person? Yes  Place? Yes   Time? Yes  Recall of three objects?  Yes  Can perform simple calculations? Yes  Displays appropriate judgment?Yes  Can read the correct time from a watch face?Yes   Advanced Directives have been discussed with the patient? Yes  List the Names of Other Physician/Practitioners you currently use: 1.  Epes - opthamologist at [facility] 2.  Jory Sims  - Cardiologist 3.  Rourk - GI   There is no immunization history on file for this patient.  Screening Tests Health Maintenance  Topic Date Due  . Pneumococcal Polysaccharide Vaccine Age 65 And Over  11/22/2013  . Ophthalmology Exam  12/23/2013  . Zostavax  09/24/2014 (Originally 11/22/2008)  . Influenza Vaccine  04/24/2014  . Urine Microalbumin  06/11/2014  . Hemoglobin A1c  07/01/2014  . Foot Exam  09/14/2014  . Colon Cancer Screening Annual Fobt  10/29/2014  . Mammogram  06/12/2015  . Tetanus/tdap  10/25/2020  . Colonoscopy  12/23/2021    All answers were reviewed with the patient and necessary referrals were made:  Cherre Robins, [facility]   02/08/2014   History reviewed: allergies, current medications, past family history, past medical history, past social  history, past surgical history and problem list  Objective:     Body mass index is 30.04 kg/(m^2). BP 142/78  Pulse 77  Ht 5\' 5"  (1.651 m)  Wt 180 lb 8 oz (81.874 kg)  BMI 30.04 kg/m2   Assessment:     Medicare Annual Wellness Visit Uncontrolled Type 2 DM with insulin pump therapy     Plan:     During the course of the visit the patient was educated and counseled about appropriate screening and preventive services including:    Pneumococcal vaccine   Influenza vaccine  Hepatitis B vaccine  Td vaccine  Screening mammography  Bone densitometry screening  Colorectal cancer screening  Glaucoma screening  Nutrition counseling   Advanced directives: AD packet from [facility] given to patient  Diet review for nutrition referral? Yes - patient refused because cannot afford extra copays. Reviewed limiting high CHO intake, low fat, low sodium diet Pneumoncoccal vaccine given today Discussed Zostavax but patient declined due to cost ($95) Reminded her that eye exam is due DEXA due 03/2014 - referral made  Medication recommendations at this time are as follows:    Change basal rate to 2.35 units/hr from midnight to midnight (will get 56.4 units of insulin per day from basal rate, when add in bolus estimated TDD of                        insulin is about 80 to 100 units per day)  Continue Invokana 300mg  1 tablet daily  Reviewed HBG goals:  Fasting 80-130 and 1-2 hour post prandial <180.  Patient is instructed to check BG 3 to 5  times per day.   Really stressed that she needs to check BG at least prior to each meal. RTC in 2 weeks to see Dr Laurance Flatten and will follow up with clinical pharmacist in 6 weeks.   Patient Instructions (the written plan) was given to the patient.  Medicare Attestation I have personally reviewed: The patient's medical and social history Their use of alcohol, tobacco or illicit drugs Their current medications and supplements The  patient's functional ability including ADLs,fall risks, home safety risks, cognitive, and hearing and visual impairment Diet and physical activities Evidence for depression or mood disorders  The patient's weight, height, BMI, and BP and HR have been recorded in the chart.  I have made referrals, counseling, and provided education to the patient based on review of the above and I have provided the patient with a written personalized care plan for preventive services.     Cherre Robins, [facility]   02/08/2014

## 2014-02-08 NOTE — Patient Instructions (Signed)
Health Maintenance Summary    PNEUMOCOCCAL POLYSACCHARIDE VACCINE AGE 65 AND OVER Overdue 11/22/2013  Given today in office (02/08/14)    OPHTHALMOLOGY EXAM Overdue 12/23/2013  last was 12/2012    ZOSTAVAX Postponed 09/24/2014 Originally 11/22/2008. Insurance / [employer]    INFLUENZA VACCINE Next Due 04/24/2014  Last was 04/2013    URINE MICROALBUMIN Next Due 06/11/2014  Last was normal 05/2013    HEMOGLOBIN A1C Next Due 03/01/2014  last was 10%    FOOT EXAM Next Due 09/14/2014      DEXA / bone density   Checking on when due - we will contact you if needed.    MAMMOGRAM Next Due 06/11/2014  Last was 06/11/2013    TETANUS/TDAP Next Due 10/25/2020  Last was 10/25/2010    COLONOSCOPY Next Due 12/23/2021  Last 12/24/2011        Preventive Care for Adults, Female A healthy lifestyle and preventive care can promote health and wellness. Preventive health guidelines for women include the following key practices.  A routine yearly physical is a good way to check with your health care provider about your health and preventive screening. It is a chance to share any concerns and updates on your health and to receive a thorough exam.  Visit your dentist for a routine exam and preventive care every 6 months. Brush your teeth twice a day and floss once a day. Good oral hygiene prevents tooth decay and gum disease.  The frequency of eye exams is based on your age, health, family medical history, use of contact lenses, and other factors. Follow your health care provider's recommendations for frequency of eye exams.  Eat a healthy diet. Foods like vegetables, fruits, whole grains, low-fat dairy products, and lean protein foods contain the nutrients you need without too many calories. Decrease your intake of foods high in solid fats, added sugars, and salt. Eat the right amount of calories for you.Get information about a proper diet from your health care provider, if necessary.  Regular physical exercise is one of the most  important things you can do for your health. Most adults should get at least 150 minutes of moderate-intensity exercise (any activity that increases your heart rate and causes you to sweat) each week. In addition, most adults need muscle-strengthening exercises on 2 or more days a week.  Maintain a healthy weight. The body mass index (BMI) is a screening tool to identify possible weight problems. It provides an estimate of body fat based on height and weight. Your health care provider can find your BMI, and can help you achieve or maintain a healthy weight.For adults 20 years and older:  A BMI below 18.5 is considered underweight.  A BMI of 18.5 to 24.9 is normal.  A BMI of 25 to 29.9 is considered overweight.  A BMI of 30 and above is considered obese.  Maintain normal blood lipids and cholesterol levels by exercising and minimizing your intake of saturated fat. Eat a balanced diet with plenty of fruit and vegetables. Blood tests for lipids and cholesterol should begin at age 66 and be repeated every 5 years. If your lipid or cholesterol levels are high, you are over 50, or you are at high risk for heart disease, you may need your cholesterol levels checked more frequently.Ongoing high lipid and cholesterol levels should be treated with medicines if diet and exercise are not working.  If you smoke, find out from your health care provider how to quit. If you do not  use tobacco, do not start.  Lung cancer screening is recommended for adults aged 80 80 years who are at high risk for developing lung cancer because of a history of smoking. A yearly low-dose CT scan of the lungs is recommended for people who have at least a 30-pack-year history of smoking and are a current smoker or have quit within the past 15 years. A pack year of smoking is smoking an average of 1 pack of cigarettes a day for 1 year (for example: 1 pack a day for 30 years or 2 packs a day for 15 years). Yearly screening should  continue until the smoker has stopped smoking for at least 15 years. Yearly screening should be stopped for people who develop a health problem that would prevent them from having lung cancer treatment.  If you are pregnant, do not drink alcohol. If you are breastfeeding, be very cautious about drinking alcohol. If you are not pregnant and choose to drink alcohol, do not have more than 1 drink per day. One drink is considered to be 12 ounces (355 mL) of beer, 5 ounces (148 mL) of wine, or 1.5 ounces (44 mL) of liquor.  Avoid use of street drugs. Do not share needles with anyone. Ask for help if you need support or instructions about stopping the use of drugs.  High blood pressure causes heart disease and increases the risk of stroke. Your blood pressure should be checked at least every 1 to 2 years. Ongoing high blood pressure should be treated with medicines if weight loss and exercise do not work.  If you are 47 65 years old, ask your health care provider if you should take aspirin to prevent strokes.  Diabetes screening involves taking a blood sample to check your fasting blood sugar level. This should be done once every 3 years, after age 65, if you are within normal weight and without risk factors for diabetes. Testing should be considered at a younger age or be carried out more frequently if you are overweight and have at least 1 risk factor for diabetes.  Breast cancer screening is essential preventive care for women. You should practice "breast self-awareness." This means understanding the normal appearance and feel of your breasts and may include breast self-examination. Any changes detected, no matter how small, should be reported to a health care provider. Women in their 38s and 30s should have a clinical breast exam (CBE) by a health care provider as part of a regular health exam every 1 to 3 years. After age 51, women should have a CBE every year. Starting at age 1, women should consider  having a mammogram (breast X-ray test) every year. Women who have a family history of breast cancer should talk to their health care provider about genetic screening. Women at a high risk of breast cancer should talk to their health care providers about having an MRI and a mammogram every year.  Breast cancer gene (BRCA)-related cancer risk assessment is recommended for women who have family members with BRCA-related cancers. BRCA-related cancers include breast, ovarian, tubal, and peritoneal cancers. Having family members with these cancers may be associated with an increased risk for harmful changes (mutations) in the breast cancer genes BRCA1 and BRCA2. Results of the assessment will determine the need for genetic counseling and BRCA1 and BRCA2 testing.  The Pap test is a screening test for cervical cancer. A Pap test can show cell changes on the cervix that might become cervical cancer if left  untreated. A Pap test is a procedure in which cells are obtained and examined from the lower end of the uterus (cervix).  Women should have a Pap test starting at age 7.  Between ages 53 and 34, Pap tests should be repeated every 2 years.  Beginning at age 58, you should have a Pap test every 3 years as long as the past 3 Pap tests have been normal.  Some women have medical problems that increase the chance of getting cervical cancer. Talk to your health care provider about these problems. It is especially important to talk to your health care provider if a new problem develops soon after your last Pap test. In these cases, your health care provider may recommend more frequent screening and Pap tests.  The above recommendations are the same for women who have or have not gotten the vaccine for human papillomavirus (HPV).  If you had a hysterectomy for a problem that was not cancer or a condition that could lead to cancer, then you no longer need Pap tests. Even if you no longer need a Pap test, a regular  exam is a good idea to make sure no other problems are starting.  If you are between ages 19 and 27 years, and you have had normal Pap tests going back 10 years, you no longer need Pap tests. Even if you no longer need a Pap test, a regular exam is a good idea to make sure no other problems are starting.  If you have had past treatment for cervical cancer or a condition that could lead to cancer, you need Pap tests and screening for cancer for at least 20 years after your treatment.  If Pap tests have been discontinued, risk factors (such as a new sexual partner) need to be reassessed to determine if screening should be resumed.  The HPV test is an additional test that may be used for cervical cancer screening. The HPV test looks for the virus that can cause the cell changes on the cervix. The cells collected during the Pap test can be tested for HPV. The HPV test could be used to screen women aged 34 years and older, and should be used in women of any age who have unclear Pap test results. After the age of 28, women should have HPV testing at the same frequency as a Pap test.  Colorectal cancer can be detected and often prevented. Most routine colorectal cancer screening begins at the age of 10 years and continues through age 77 years. However, your health care provider may recommend screening at an earlier age if you have risk factors for colon cancer. On a yearly basis, your health care provider may provide home test kits to check for hidden blood in the stool. Use of a small camera at the end of a tube, to directly examine the colon (sigmoidoscopy or colonoscopy), can detect the earliest forms of colorectal cancer. Talk to your health care provider about this at age 21, when routine screening begins. Direct exam of the colon should be repeated every 5 10 years through age 61 years, unless early forms of pre-cancerous polyps or small growths are found.  People who are at an increased risk for  hepatitis B should be screened for this virus. You are considered at high risk for hepatitis B if:  You were born in a country where hepatitis B occurs often. Talk with your health care provider about which countries are considered high risk.  Your  parents were born in a high-risk country and you have not received a shot to protect against hepatitis B (hepatitis B vaccine).  You have HIV or AIDS.  You use needles to inject street drugs.  You live with, or have sex with, someone who has Hepatitis B.  You get hemodialysis treatment.  You take certain medicines for conditions like cancer, organ transplantation, and autoimmune conditions.  Hepatitis C blood testing is recommended for all people born from [birth year] through 1967 and any individual with known risks for hepatitis C.  Practice safe sex. Use condoms and avoid high-risk sexual practices to reduce the spread of sexually transmitted infections (STIs). STIs include gonorrhea, chlamydia, syphilis, trichomonas, herpes, HPV, and human immunodeficiency virus (HIV). Herpes, HIV, and HPV are viral illnesses that have no cure. They can result in disability, cancer, and death. Sexually active women aged 42 years and younger should be checked for chlamydia. Older women with new or multiple partners should also be tested for chlamydia. Testing for other STIs is recommended if you are sexually active and at increased risk.  Osteoporosis is a disease in which the bones lose minerals and strength with aging. This can result in serious bone fractures or breaks. The risk of osteoporosis can be identified using a bone density scan. Women ages 56 years and over and women at risk for fractures or osteoporosis should discuss screening with their health care providers. Ask your health care provider whether you should take a calcium supplement or vitamin D to reduce the rate of osteoporosis.  Menopause can be associated with physical symptoms and risks. Hormone  replacement therapy is available to decrease symptoms and risks. You should talk to your health care provider about whether hormone replacement therapy is right for you.  Use sunscreen. Apply sunscreen liberally and repeatedly throughout the day. You should seek shade when your shadow is shorter than you. Protect yourself by wearing long sleeves, pants, a wide-brimmed hat, and sunglasses year round, whenever you are outdoors.  Once a month, do a whole body skin exam, using a mirror to look at the skin on your back. Tell your health care provider of new moles, moles that have irregular borders, moles that are larger than a pencil eraser, or moles that have changed in shape or color.  Stay current with required vaccines (immunizations).  Influenza vaccine. All adults should be immunized every year.  Tetanus, diphtheria, and acellular pertussis (Td, Tdap) vaccine. Pregnant women should receive 1 dose of Tdap vaccine during each pregnancy. The dose should be obtained regardless of the length of time since the last dose. Immunization is preferred during the 27th 36th week of gestation. An adult who has not previously received Tdap or who does not know her vaccine status should receive 1 dose of Tdap. This initial dose should be followed by tetanus and diphtheria toxoids (Td) booster doses every 10 years. Adults with an unknown or incomplete history of completing a 3-dose immunization series with Td-containing vaccines should begin or complete a primary immunization series including a Tdap dose. Adults should receive a Td booster every 10 years.  Varicella vaccine. An adult without evidence of immunity to varicella should receive 2 doses or a second dose if she has previously received 1 dose. Pregnant females who do not have evidence of immunity should receive the first dose after pregnancy. This first dose should be obtained before leaving the health care facility. The second dose should be obtained 4 8 weeks  after the first dose.  Human papillomavirus (HPV) vaccine. Females aged 94 26 years who have not received the vaccine previously should obtain the 3-dose series. The vaccine is not recommended for use in pregnant females. However, pregnancy testing is not needed before receiving a dose. If a female is found to be pregnant after receiving a dose, no treatment is needed. In that case, the remaining doses should be delayed until after the pregnancy. Immunization is recommended for any person with an immunocompromised condition through the age of 26 years if she did not get any or all doses earlier. During the 3-dose series, the second dose should be obtained 4 8 weeks after the first dose. The third dose should be obtained 24 weeks after the first dose and 16 weeks after the second dose.  Zoster vaccine. One dose is recommended for adults aged 28 years or older unless certain conditions are present.  Measles, mumps, and rubella (MMR) vaccine. Adults born before [birth year] generally are considered immune to measles and mumps. Adults born in [birth year] or later should have 1 or more doses of MMR vaccine unless there is a contraindication to the vaccine or there is laboratory evidence of immunity to each of the three diseases. A routine second dose of MMR vaccine should be obtained at least 28 days after the first dose for students attending postsecondary schools, health care workers, or international travelers. People who received inactivated measles vaccine or an unknown type of measles vaccine during 1963 1967 should receive 2 doses of MMR vaccine. People who received inactivated mumps vaccine or an unknown type of mumps vaccine before 1979 and are at high risk for mumps infection should consider immunization with 2 doses of MMR vaccine. For females of childbearing age, rubella immunity should be determined. If there is no evidence of immunity, females who are not pregnant should be vaccinated. If there is no evidence of  immunity, females who are pregnant should delay immunization until after pregnancy. Unvaccinated health care workers born before [birth year] who lack laboratory evidence of measles, mumps, or rubella immunity or laboratory confirmation of disease should consider measles and mumps immunization with 2 doses of MMR vaccine or rubella immunization with 1 dose of MMR vaccine.  Pneumococcal 13-valent conjugate (PCV13) vaccine. When indicated, a person who is uncertain of her immunization history and has no record of immunization should receive the PCV13 vaccine. An adult aged 81 years or older who has certain medical conditions and has not been previously immunized should receive 1 dose of PCV13 vaccine. This PCV13 should be followed with a dose of pneumococcal polysaccharide (PPSV23) vaccine. The PPSV23 vaccine dose should be obtained at least 8 weeks after the dose of PCV13 vaccine. An adult aged 107 years or older who has certain medical conditions and previously received 1 or more doses of PPSV23 vaccine should receive 1 dose of PCV13. The PCV13 vaccine dose should be obtained 1 or more years after the last PPSV23 vaccine dose.  Pneumococcal polysaccharide (PPSV23) vaccine. When PCV13 is also indicated, PCV13 should be obtained first. All adults aged 44 years and older should be immunized. An adult younger than age 65 years who has certain medical conditions should be immunized. Any person who resides in a nursing home or long-term care facility should be immunized. An adult smoker should be immunized. People with an immunocompromised condition and certain other conditions should receive both PCV13 and PPSV23 vaccines. People with human immunodeficiency virus (HIV) infection should be immunized as soon as possible after diagnosis. Immunization during chemotherapy  or radiation therapy should be avoided. Routine use of PPSV23 vaccine is not recommended for American Indians, [place] Natives, or people younger than 65 years  unless there are medical conditions that require PPSV23 vaccine. When indicated, people who have unknown immunization and have no record of immunization should receive PPSV23 vaccine. One-time revaccination 5 years after the first dose of PPSV23 is recommended for people aged 75 64 years who have chronic kidney failure, nephrotic syndrome, asplenia, or immunocompromised conditions. People who received 1 2 doses of PPSV23 before age 63 years should receive another dose of PPSV23 vaccine at age 45 years or later if at least 5 years have passed since the previous dose. Doses of PPSV23 are not needed for people immunized with PPSV23 at or after age 60 years.  Meningococcal vaccine. Adults with asplenia or persistent complement component deficiencies should receive 2 doses of quadrivalent meningococcal conjugate (MenACWY-D) vaccine. The doses should be obtained at least 2 months apart. Microbiologists working with certain meningococcal bacteria, [employer] recruits, people at risk during an outbreak, and people who travel to or live in countries with a high rate of meningitis should be immunized. A first-year college student up through age 43 years who is living in a residence hall should receive a dose if she did not receive a dose on or after her 16th birthday. Adults who have certain high-risk conditions should receive one or more doses of vaccine.  Hepatitis A vaccine. Adults who wish to be protected from this disease, have certain high-risk conditions, work with hepatitis A-infected animals, work in hepatitis A research labs, or travel to or work in countries with a high rate of hepatitis A should be immunized. Adults who were previously unvaccinated and who anticipate close contact with an international adoptee during the first 60 days after arrival in the [place] from a country with a high rate of hepatitis A should be immunized.  Hepatitis B vaccine. Adults who wish to be protected from this disease,  have certain high-risk conditions, may be exposed to blood or other infectious body fluids, are household contacts or sex partners of hepatitis B positive people, are clients or workers in certain care facilities, or travel to or work in countries with a high rate of hepatitis B should be immunized.

## 2014-02-25 ENCOUNTER — Ambulatory Visit: Payer: Medicare HMO | Admitting: Family Medicine

## 2014-03-08 ENCOUNTER — Other Ambulatory Visit: Payer: Self-pay | Admitting: *Deleted

## 2014-03-08 MED ORDER — ZOLPIDEM TARTRATE 10 MG PO TABS: 5.0000 mg | ORAL_TABLET | Freq: Every evening | ORAL | Status: DC | PRN

## 2014-03-08 MED ORDER — CITALOPRAM HYDROBROMIDE 20 MG PO TABS: 20.0000 mg | ORAL_TABLET | Freq: Every day | ORAL | Status: DC

## 2014-03-08 NOTE — Telephone Encounter (Signed)
Spoke with patient, refills are ready to be picked-up.

## 2014-03-08 NOTE — Telephone Encounter (Signed)
These prescriptions are okay for refill. Have patient come to pick up

## 2014-03-08 NOTE — Telephone Encounter (Signed)
Last ov 02/08/14 with Tammy. Please print for mail order and rout to pool A to notify pt to pickup rx.

## 2014-03-24 ENCOUNTER — Other Ambulatory Visit: Payer: Self-pay | Admitting: Family Medicine

## 2014-04-16 ENCOUNTER — Telehealth: Payer: Self-pay | Admitting: Family Medicine

## 2014-04-16 ENCOUNTER — Other Ambulatory Visit: Payer: Self-pay | Admitting: Nurse Practitioner

## 2014-04-16 MED ORDER — PEN NEEDLES 31G X 6 MM MISC: Status: DC

## 2014-04-16 MED ORDER — INSULIN ASPART 100 UNIT/ML FLEXPEN: PEN_INJECTOR | SUBCUTANEOUS | Status: DC

## 2014-04-16 MED ORDER — INSULIN DETEMIR 100 UNIT/ML FLEXPEN: PEN_INJECTOR | SUBCUTANEOUS | Status: DC

## 2014-04-16 NOTE — Telephone Encounter (Signed)
Patient called and insulin pump recoreds reviewed.  Discontinue pump use and start Levemir 50 units daily and Novolog 15 to 20 units prior to meals (will start with 15 units and increase as needed) Patient called and aware of instructions

## 2014-04-16 NOTE — Telephone Encounter (Signed)
Patient will need to restart long acting insulin as well to cover basal needs.

## 2014-04-16 NOTE — Telephone Encounter (Signed)
Please let clinical pharmacist be aware of this Confirm refills with the clinical pharmacist before calling them in 2 the pharmacy

## 2014-04-19 ENCOUNTER — Ambulatory Visit: Payer: Medicare HMO | Admitting: Family Medicine

## 2014-04-19 NOTE — Telephone Encounter (Signed)
Please call in xanax with 0 refills 

## 2014-04-19 NOTE — Telephone Encounter (Signed)
Patient NTBS for follow up and lab work  

## 2014-04-19 NOTE — Telephone Encounter (Signed)
Called in.

## 2014-04-19 NOTE — Telephone Encounter (Signed)
Last seen 10/29/13 by you, but sees Tammy often. [facility]

## 2014-04-22 ENCOUNTER — Ambulatory Visit: Payer: Self-pay

## 2014-04-23 ENCOUNTER — Encounter (HOSPITAL_COMMUNITY): Payer: Self-pay | Admitting: Emergency Medicine

## 2014-04-23 ENCOUNTER — Emergency Department (HOSPITAL_COMMUNITY)
Admission: EM | Admit: 2014-04-23 | Discharge: 2014-04-24 | Disposition: A | Discharge status: Home / Self Care | Payer: Medicare HMO | Attending: Emergency Medicine | Admitting: Emergency Medicine

## 2014-04-23 DIAGNOSIS — I252 Old myocardial infarction: Secondary | ICD-10-CM | POA: Insufficient documentation

## 2014-04-23 DIAGNOSIS — Z8701 Personal history of pneumonia (recurrent): Secondary | ICD-10-CM | POA: Insufficient documentation

## 2014-04-23 DIAGNOSIS — Z9861 Coronary angioplasty status: Secondary | ICD-10-CM | POA: Diagnosis not present

## 2014-04-23 DIAGNOSIS — E119 Type 2 diabetes mellitus without complications: Secondary | ICD-10-CM | POA: Diagnosis not present

## 2014-04-23 DIAGNOSIS — Z8742 Personal history of other diseases of the female genital tract: Secondary | ICD-10-CM | POA: Diagnosis not present

## 2014-04-23 DIAGNOSIS — K219 Gastro-esophageal reflux disease without esophagitis: Secondary | ICD-10-CM | POA: Insufficient documentation

## 2014-04-23 DIAGNOSIS — F329 Major depressive disorder, single episode, unspecified: Secondary | ICD-10-CM | POA: Diagnosis not present

## 2014-04-23 DIAGNOSIS — I209 Angina pectoris, unspecified: Secondary | ICD-10-CM | POA: Insufficient documentation

## 2014-04-23 DIAGNOSIS — I509 Heart failure, unspecified: Secondary | ICD-10-CM | POA: Insufficient documentation

## 2014-04-23 DIAGNOSIS — R21 Rash and other nonspecific skin eruption: Secondary | ICD-10-CM | POA: Diagnosis present

## 2014-04-23 DIAGNOSIS — G43909 Migraine, unspecified, not intractable, without status migrainosus: Secondary | ICD-10-CM | POA: Diagnosis not present

## 2014-04-23 DIAGNOSIS — E785 Hyperlipidemia, unspecified: Secondary | ICD-10-CM | POA: Diagnosis not present

## 2014-04-23 DIAGNOSIS — Z7902 Long term (current) use of antithrombotics/antiplatelets: Secondary | ICD-10-CM | POA: Diagnosis not present

## 2014-04-23 DIAGNOSIS — I10 Essential (primary) hypertension: Secondary | ICD-10-CM | POA: Insufficient documentation

## 2014-04-23 DIAGNOSIS — I2510 Atherosclerotic heart disease of native coronary artery without angina pectoris: Secondary | ICD-10-CM | POA: Insufficient documentation

## 2014-04-23 DIAGNOSIS — L299 Pruritus, unspecified: Secondary | ICD-10-CM | POA: Diagnosis not present

## 2014-04-23 DIAGNOSIS — Z7982 Long term (current) use of aspirin: Secondary | ICD-10-CM | POA: Diagnosis not present

## 2014-04-23 DIAGNOSIS — F411 Generalized anxiety disorder: Secondary | ICD-10-CM | POA: Insufficient documentation

## 2014-04-23 DIAGNOSIS — F3289 Other specified depressive episodes: Secondary | ICD-10-CM | POA: Insufficient documentation

## 2014-04-23 DIAGNOSIS — Z8673 Personal history of transient ischemic attack (TIA), and cerebral infarction without residual deficits: Secondary | ICD-10-CM | POA: Diagnosis not present

## 2014-04-23 DIAGNOSIS — Z9889 Other specified postprocedural states: Secondary | ICD-10-CM | POA: Insufficient documentation

## 2014-04-23 DIAGNOSIS — Z79899 Other long term (current) drug therapy: Secondary | ICD-10-CM | POA: Insufficient documentation

## 2014-04-23 MED ORDER — DIPHENHYDRAMINE HCL 25 MG PO CAPS
50.0000 mg | ORAL_CAPSULE | Freq: Once | ORAL | Status: AC
  Administered 2014-04-24: 50 mg via ORAL
  Filled 2014-04-23: qty 2

## 2014-04-23 MED ORDER — PREDNISONE 20 MG PO TABS: 20.0000 mg | ORAL_TABLET | Freq: Every day | ORAL | Status: DC

## 2014-04-23 MED ORDER — PREDNISONE 20 MG PO TABS
40.0000 mg | ORAL_TABLET | Freq: Every day | ORAL | Status: DC
  Administered 2014-04-24: 40 mg via ORAL
  Filled 2014-04-23: qty 2

## 2014-04-23 NOTE — Discharge Instructions (Signed)
Rash °A rash is a change in the color or texture of your skin. There are many different types of rashes. You may have other problems that accompany your rash. °CAUSES  °· Infections. °· Allergic reactions. This can include allergies to pets or foods. °· Certain medicines. °· Exposure to certain chemicals, soaps, or cosmetics. °· Heat. °· Exposure to poisonous plants. °· Tumors, both cancerous and noncancerous. °SYMPTOMS  °· Redness. °· Scaly skin. °· Itchy skin. °· Dry or cracked skin. °· Bumps. °· Blisters. °· Pain. °DIAGNOSIS  °Your caregiver may do a physical exam to determine what type of rash you have. A skin sample (biopsy) may be taken and examined under a microscope. °TREATMENT  °Treatment depends on the type of rash you have. Your caregiver may prescribe certain medicines. For serious conditions, you may need to see a skin doctor (dermatologist). °HOME CARE INSTRUCTIONS  °· Avoid the substance that caused your rash. °· Do not scratch your rash. This can cause infection. °· You may take cool baths to help stop itching. °· Only take over-the-counter or prescription medicines as directed by your caregiver. °· Keep all follow-up appointments as directed by your caregiver. °SEEK IMMEDIATE MEDICAL CARE IF: °· You have increasing pain, swelling, or redness. °· You have a fever. °· You have new or severe symptoms. °· You have body aches, diarrhea, or vomiting. °· Your rash is not better after 3 days. °MAKE SURE YOU: °· Understand these instructions. °· Will watch your condition. °· Will get help right away if you are not doing well or get worse. °Document Released: 08/31/2002 Document Revised: 12/03/2011 Document Reviewed: 06/25/2011 °ExitCare® Patient Information ©2015 [employer]. This information is not intended to replace advice given to you by your health care provider. Make sure you discuss any questions you have with your health care provider. ° °Pruritus  °Pruritus is an itch. There are many different  problems that can cause an itch. Dry skin is one of the most common causes of itching. Most cases of itching do not require medical attention.  °HOME CARE INSTRUCTIONS  °Make sure your skin is moistened on a regular basis. A moisturizer that contains petroleum jelly is best for keeping moisture in your skin. If you develop a rash, you may try the following for relief:  °· Use corticosteroid cream. °· Apply cool compresses to the affected areas. °· Bathe with Epsom salts or baking soda in the bathwater. °· Soak in colloidal oatmeal baths. These are available at your pharmacy. °· Apply baking soda paste to the rash. Stir water into baking soda until it reaches a paste-like consistency. °· Use an anti-itch lotion. °· Take over-the-counter diphenhydramine medicine by mouth as the instructions direct. °· Avoid scratching. Scratching may cause the rash to become infected. If itching is very bad, your caregiver may suggest prescription lotions or creams to lessen your symptoms. °· Avoid hot showers, which can make itching worse. A cold shower may help with itching as long as you use a moisturizer after the shower. °SEEK MEDICAL CARE IF: °The itching does not go away after several days. °Document Released: 05/23/2011 Document Revised: 01/25/2014 Document Reviewed: 05/23/2011 °ExitCare® Patient Information ©2015 [employer]. This information is not intended to replace advice given to you by your health care provider. Make sure you discuss any questions you have with your health care provider. ° °

## 2014-04-23 NOTE — ED Notes (Signed)
Patient states she began having itching and burning on L upper back and shoulder on Wednesday.  Has progressed to include L forearm and L lateral calf.  Has few linear scratch marks, but no vesicles noted.

## 2014-04-23 NOTE — ED Provider Notes (Signed)
63 year old female comes in with about a three-day history of a pruritic rash which is present on the arms, legs, trunk. She is not aware of any unusual exposures. Rash is not painful but she is concerned it might be shingles. She has not taken anything for itching. On exam, she shows some excoriations from scratching but I am unable to clearly identify an underlying rash. She will be treated with a short course of prednisone. Because she is diabetic, her prednisone dose will be kept to a minimum. She's a vice use over-the-counter second-generation antihistamines and followup with PCP.  Medical screening examination/treatment/procedure(s) were conducted as a shared visit with non-physician practitioner(s) and myself.  I personally evaluated the patient during the encounter.    Delora Fuel, MD 94/80/16 [id number]

## 2014-04-23 NOTE — ED Notes (Addendum)
Pt states she has a rash to back legs and arms. Fears that it may be shingles, has not had the vaccine. Pt has had the chicken pox when she was 63 years old. States the rash burns and itches.

## 2014-04-23 NOTE — ED Provider Notes (Signed)
CSN: [id number]     Arrival date & time 04/23/14  2151 History   First MD Initiated Contact with Patient 04/23/14 2314     Chief Complaint  Patient presents with  . Rash     (Consider location/radiation/quality/duration/timing/severity/associated sxs/prior Treatment) Patient is a 63 y.o. female presenting with rash. The history is provided by the patient. No language interpreter was used.  Rash Location:  Hand, leg and torso Hand rash location:  Dorsum of L hand and dorsum of R hand Torso rash location:  Upper back Quality: dryness and itchiness   Severity:  Moderate Onset quality:  Gradual Timing:  Constant Progression:  Worsening Chronicity:  New Relieved by:  Nothing Worsened by:  Nothing tried Ineffective treatments:  None tried Associated symptoms: no fever and no myalgias    Pt complains of itching since Wednesday Past Medical History  Diagnosis Date  . Diverticulitis, colon   . MS (multiple sclerosis)     Not confirmed  . Depression   . PVD (peripheral vascular disease)   . HLD (hyperlipidemia)   . Essential hypertension, benign   . CAD (coronary artery disease)     DES to circumflex 02/2007, BMS to LAD and PTCA diagonal 03/2007  . Carotid artery plaque     Mild  . GERD (gastroesophageal reflux disease)   . NSTEMI (non-ST elevated myocardial infarction)     02/2007  . Prolapse of uterus   . TIA (transient ischemic attack) 1980's  . PAT (paroxysmal atrial tachycardia)   . Anxiety   . CHF (congestive heart failure)   . Anginal pain   . Pneumonia 1980's    "once"  . IDDM (insulin dependent diabetes mellitus)   . H/O hiatal hernia   . Migraine     "used to have them really bad; don't have them anymore" (01/07/2014)  . Elevated d-dimer 01/08/2014  . Cataract    Past Surgical History  Procedure Laterality Date  . Appendectomy  ~ 1970  . Cholecystectomy  ?1987  . Breast biopsy Right 1980's  . Colonoscopy  2002    Dr. Anwar--> Severe diverticular changes in  the region of the sigmoid and descending colon with scattered diverticular changes throughout the rest of the colon. No polyps, ulcerations. Despite numerous manipulations, the tip of the scope could not be tipped into the cecal area.  . Colonoscopy  01/10/2012    Procedure: COLONOSCOPY;  Surgeon: Daneil Dolin, MD;  Location: [facility];  Service: Endoscopy;  Laterality: N/A;  1:55  . Coronary angioplasty with stent placement  ~ 1997 X 2    "2 + 1"  . Cardiac catheterization  01/07/2014  . Abdominal hysterectomy  1986    ovaries remain - prolaspe uterus   . Breast lumpectomy Right 1980's    Dr. Charlynne Pander   . Eye surgery Bilateral 2014    cataract   Family History  Problem Relation Age of Onset  . Heart attack Mother 43  . Diabetes Father   . Heart attack Father 56  . Heart attack Brother 30    x6  . Colon cancer Paternal Aunt     53s, died with brain anuerysm  . Crohn's disease Cousin     paternal   History  Substance Use Topics  . Smoking status: Passive Smoke Exposure - Never Smoker  . Smokeless tobacco: Never Used     Comment: spouse, 59 years - husband has quit 01/2011  . Alcohol Use: No   OB History  Grav Para Term Preterm Abortions TAB SAB Ect Mult Living                 Review of Systems  Constitutional: Negative for fever.  Musculoskeletal: Negative for myalgias.  Skin: Positive for rash.  All other systems reviewed and are negative.     Allergies  Codeine; Iohexol; and Ticlid  Home Medications   Prior to Admission medications   Medication Sig Start Date End Date Taking? Authorizing Provider  ALPRAZolam Duanne Moron) 0.5 MG tablet Take 0.5 mg by mouth 2 (two) times daily. *prescribed one tablet three times daily as needed*   Yes Historical Provider, MD  aspirin EC 325 MG tablet Take 325 mg by mouth daily.   Yes Historical Provider, MD  atenolol (TENORMIN) 50 MG tablet Take 1 tablet (50 mg total) by mouth 2 (two) times daily. 10/29/13  Yes Chipper Herb, MD   Cholecalciferol (VITAMIN D3) 5000 UNITS CAPS Take 1 capsule by mouth daily.   Yes Historical Provider, MD  citalopram (CELEXA) 20 MG tablet Take 1 tablet (20 mg total) by mouth daily. 03/08/14  Yes Chipper Herb, MD  clopidogrel (PLAVIX) 75 MG tablet Take 1 tablet (75 mg total) by mouth daily. 10/29/13  Yes Chipper Herb, MD  esomeprazole (NEXIUM) 40 MG capsule Take 1 capsule (40 mg total) by mouth 2 (two) times daily before a meal. Indigestion- after twice a day for tow weeks resume once a day 01/08/14 04/23/14 Yes [name], NP  ezetimibe (ZETIA) 10 MG tablet Take 1 tablet (10 mg total) by mouth daily. 06/11/13  Yes Chipper Herb, MD  furosemide (LASIX) 40 MG tablet Take 1 tablet (40 mg total) by mouth 2 (two) times daily. 10/29/13  Yes Chipper Herb, MD  Insulin Detemir (LEVEMIR FLEXTOUCH) 100 UNIT/ML Pen Inject 50 Units into the skin every morning.   Yes Historical Provider, MD  insulin lispro (HUMALOG KWIKPEN) 100 UNIT/ML KiwkPen Inject 25 Units into the skin 3 (three) times daily.   Yes Historical Provider, MD  isosorbide mononitrate (IMDUR) 120 MG 24 hr tablet Take 120 mg by mouth once a week.   Yes Historical Provider, MD  lisinopril (PRINIVIL,ZESTRIL) 20 MG tablet Take 1 tablet (20 mg total) by mouth 2 (two) times daily. 06/11/13  Yes Chipper Herb, MD  Multiple Vitamins-Minerals (EYE VITAMINS PO) Take 1 capsule by mouth daily.   Yes Historical Provider, MD  potassium chloride SA (K-DUR,KLOR-CON) 20 MEQ tablet Take 1 tablet (20 mEq total) by mouth daily. 12/23/13  Yes Chipper Herb, MD  rosuvastatin (CRESTOR) 40 MG tablet Take 1 tablet (40 mg total) by mouth daily. 06/11/13  Yes Chipper Herb, MD  zolpidem (AMBIEN) 10 MG tablet Take 0.5-1 tablets (5-10 mg total) by mouth at bedtime as needed for sleep. 03/08/14  Yes Chipper Herb, MD  nitroGLYCERIN (NITROSTAT) 0.4 MG SL tablet Place 1 tablet (0.4 mg total) under the tongue every 5 (five) minutes as needed. For chest pain 06/11/13   Chipper Herb, MD   BP 186/92  Pulse 79  Temp(Src) 98.1 F (36.7 C) (Oral)  Resp 18  Ht 5\' 6"  (1.676 m)  Wt 185 lb (83.915 kg)  BMI 29.87 kg/m2  SpO2 97% Physical Exam  Nursing note and vitals reviewed. Constitutional: She is oriented to person, place, and time. She appears well-developed and well-nourished.  HENT:  Head: Normocephalic.  Eyes: EOM are normal. Pupils are equal, round, and reactive to light.  Neck:  Normal range of motion.  Cardiovascular: Normal rate.   Pulmonary/Chest: Effort normal.  Abdominal: Soft. She exhibits no distension.  Musculoskeletal: Normal range of motion.  Neurological: She is alert and oriented to person, place, and time.  Skin: There is erythema.  Multiple scratch marks, hands, back, left leg,    Psychiatric: She has a normal mood and affect.    ED Course  Procedures (including critical care time) Labs Review Labs Reviewed - No data to display  Imaging Review No results found.   EKG Interpretation None      MDM   Final diagnoses:  Itching    Prednisone Zyrtec Benadryl See your Physician for recheck on Monday Sliding scale insulin    [place], PA-C 04/24/14 0050

## 2014-04-26 ENCOUNTER — Ambulatory Visit (INDEPENDENT_AMBULATORY_CARE_PROVIDER_SITE_OTHER): Payer: Medicare HMO | Admitting: Family Medicine

## 2014-04-26 ENCOUNTER — Encounter: Payer: Self-pay | Admitting: Family Medicine

## 2014-04-26 VITALS — BP 144/81 | HR 86 | Temp 97.5°F | Ht 66.0 in | Wt 179.0 lb

## 2014-04-26 DIAGNOSIS — E559 Vitamin D deficiency, unspecified: Secondary | ICD-10-CM

## 2014-04-26 DIAGNOSIS — K219 Gastro-esophageal reflux disease without esophagitis: Secondary | ICD-10-CM

## 2014-04-26 DIAGNOSIS — IMO0002 Reserved for concepts with insufficient information to code with codable children: Secondary | ICD-10-CM

## 2014-04-26 DIAGNOSIS — E785 Hyperlipidemia, unspecified: Secondary | ICD-10-CM

## 2014-04-26 DIAGNOSIS — IMO0001 Reserved for inherently not codable concepts without codable children: Secondary | ICD-10-CM

## 2014-04-26 DIAGNOSIS — R21 Rash and other nonspecific skin eruption: Secondary | ICD-10-CM

## 2014-04-26 DIAGNOSIS — E1165 Type 2 diabetes mellitus with hyperglycemia: Secondary | ICD-10-CM

## 2014-04-26 DIAGNOSIS — I10 Essential (primary) hypertension: Secondary | ICD-10-CM

## 2014-04-26 LAB — POCT CBC
GRANULOCYTE PERCENT: 61.2 % (ref 37–80)
HEMATOCRIT: 44.1 % (ref 37.7–47.9)
HEMOGLOBIN: 14.3 g/dL (ref 12.2–16.2)
Lymph, poc: 2.5 (ref 0.6–3.4)
MCH, POC: 28.2 pg (ref 27–31.2)
MCHC: 32.4 g/dL (ref 31.8–35.4)
MCV: 87.1 fL (ref 80–97)
MPV: 8.3 fL (ref 0–99.8)
POC Granulocyte: 4.6 (ref 2–6.9)
POC LYMPH PERCENT: 33.8 %L (ref 10–50)
Platelet Count, POC: 180 10*3/uL (ref 142–424)
RDW, POC: 14 %
WBC: 7.5 10*3/uL (ref 4.6–10.2)

## 2014-04-26 LAB — POCT GLYCOSYLATED HEMOGLOBIN (HGB A1C): HEMOGLOBIN A1C: 9.7

## 2014-04-26 LAB — POCT UA - MICROALBUMIN: Microalbumin Ur, POC: NEGATIVE mg/L

## 2014-04-26 MED ORDER — ALPRAZOLAM 0.5 MG PO TABS: 0.5000 mg | ORAL_TABLET | Freq: Three times a day (TID) | ORAL | Status: DC | PRN

## 2014-04-26 MED ORDER — ATENOLOL 50 MG PO TABS: 50.0000 mg | ORAL_TABLET | Freq: Two times a day (BID) | ORAL | Status: DC

## 2014-04-26 MED ORDER — ZOLPIDEM TARTRATE 10 MG PO TABS: 5.0000 mg | ORAL_TABLET | Freq: Every evening | ORAL | Status: DC | PRN

## 2014-04-26 MED ORDER — CLOPIDOGREL BISULFATE 75 MG PO TABS: 75.0000 mg | ORAL_TABLET | Freq: Every day | ORAL | Status: DC

## 2014-04-26 MED ORDER — FUROSEMIDE 40 MG PO TABS: 40.0000 mg | ORAL_TABLET | Freq: Two times a day (BID) | ORAL | Status: DC

## 2014-04-26 NOTE — Progress Notes (Signed)
Subjective:    Patient ID: [name], female    DOB: [birth year], 63 y.o.   MRN: [id number]  HPI Pt here for follow up and management of chronic medical problems. The patient has had a recent visit to the emergency room for rash and itching. The patient was placed on prednisone. The rash and itching are better per the patient. The blood sugars have been increased because of the necessity of her coming off of the insulin pump. She had to come off of the insulin pump because insurance will no longer pay for it the supplies. She is to get lab work today and and a urine microalbumin. Her medications have been refilled.        Patient Active Problem List   Diagnosis Date Noted  . Cataract 02/08/2014  . Macular degeneration 02/08/2014  . Elevated d-dimer, neg VQ scan 01/08/2014  . Unstable angina 01/07/2014  . Chest pain 12/30/2013  . Generalized anxiety disorder 09/14/2013  . Claudication 06/18/2013  . Diverticulosis of colon 01/01/2012  . Left lower quadrant pain 01/01/2012  . GERD (gastroesophageal reflux disease) 01/01/2012  . Constipation 01/01/2012  . Hypokalemia 08/24/2011  . Diabetes type 2, uncontrolled- on insulin pump 08/23/2011  . Essential hypertension, benign 08/23/2011  . PALPITATIONS 02/10/2010  . HYPERLIPIDEMIA 05/10/2009  . DEPRESSION 05/10/2009  . MULTIPLE SCLEROSIS 05/10/2009  . CAD, NATIVE VESSEL, cath 01/07/14 non obstructive coronary disease 08/26/2008  . 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 08/26/2008   Outpatient Encounter Prescriptions as of 04/26/2014  Medication Sig  . ALPRAZolam (XANAX) 0.5 MG tablet Take 0.5 mg by mouth 3 (three) times daily as needed. *prescribed one tablet three times daily as needed*  . aspirin EC 325 MG tablet Take 325 mg by mouth daily.  Marland Kitchen atenolol (TENORMIN) 50 MG tablet Take 1 tablet (50 mg total) by mouth 2 (two) times daily.  . Cholecalciferol (VITAMIN D3) 5000 UNITS CAPS Take 1 capsule by mouth daily.  . citalopram (CELEXA) 20 MG  tablet Take 1 tablet (20 mg total) by mouth daily.  . clopidogrel (PLAVIX) 75 MG tablet Take 1 tablet (75 mg total) by mouth daily.  Marland Kitchen esomeprazole (NEXIUM) 40 MG capsule Take 1 capsule (40 mg total) by mouth 2 (two) times daily before a meal. Indigestion- after twice a day for tow weeks resume once a day  . ezetimibe (ZETIA) 10 MG tablet Take 1 tablet (10 mg total) by mouth daily.  . furosemide (LASIX) 40 MG tablet Take 1 tablet (40 mg total) by mouth 2 (two) times daily.  . Insulin Detemir (LEVEMIR FLEXTOUCH) 100 UNIT/ML Pen Inject 50 Units into the skin every morning.  . insulin lispro (HUMALOG KWIKPEN) 100 UNIT/ML KiwkPen Inject 25 Units into the skin 3 (three) times daily.  . isosorbide mononitrate (IMDUR) 120 MG 24 hr tablet Take 120 mg by mouth once a week.  Marland Kitchen lisinopril (PRINIVIL,ZESTRIL) 20 MG tablet Take 1 tablet (20 mg total) by mouth 2 (two) times daily.  . Multiple Vitamins-Minerals (EYE VITAMINS PO) Take 1 capsule by mouth daily.  . nitroGLYCERIN (NITROSTAT) 0.4 MG SL tablet Place 1 tablet (0.4 mg total) under the tongue every 5 (five) minutes as needed. For chest pain  . potassium chloride SA (K-DUR,KLOR-CON) 20 MEQ tablet Take 1 tablet (20 mEq total) by mouth daily.  . predniSONE (DELTASONE) 20 MG tablet Take 1 tablet (20 mg total) by mouth daily.  . rosuvastatin (CRESTOR) 40 MG tablet Take 1 tablet (40 mg total) by mouth daily.  Marland Kitchen  zolpidem (AMBIEN) 10 MG tablet Take 0.5-1 tablets (5-10 mg total) by mouth at bedtime as needed for sleep.    Review of Systems  Constitutional: Negative.   HENT: Negative.   Eyes: Negative.   Respiratory: Negative.   Cardiovascular: Negative.   Gastrointestinal: Negative.   Endocrine: Negative.   Genitourinary: Negative.   Musculoskeletal: Negative.   Skin: Positive for rash (itching).  Allergic/Immunologic: Negative.   Neurological: Negative.   Hematological: Negative.   Psychiatric/Behavioral: Negative.        Objective:   Physical  Exam  Nursing note and vitals reviewed. Constitutional: She is oriented to person, place, and time. She appears well-developed and well-nourished. No distress.  Calm And cooperative and pleasant  HENT:  Head: Normocephalic and atraumatic.  Right Ear: External ear normal.  Left Ear: External ear normal.  Mouth/Throat: Oropharynx is clear and moist. No oropharyngeal exudate.  Nasal congestion bilaterally  Eyes: Conjunctivae and EOM are normal. Pupils are equal, round, and reactive to light. Right eye exhibits no discharge. Left eye exhibits no discharge. No scleral icterus.  Neck: Normal range of motion. Neck supple. No thyromegaly present.  No carotid bruits  Cardiovascular: Normal rate, regular rhythm, normal heart sounds and intact distal pulses.  Exam reveals no gallop and no friction rub.   No murmur heard. At 72 per minute  Pulmonary/Chest: Effort normal and breath sounds normal. No respiratory distress. She has no wheezes. She has no rales. She exhibits no tenderness.  Abdominal: Soft. Bowel sounds are normal. She exhibits no mass. There is no tenderness. There is no rebound and no guarding.  No abdominal bruits, no masses.  Musculoskeletal: Normal range of motion. She exhibits no edema and no tenderness.  Lymphadenopathy:    She has no cervical adenopathy.  Neurological: She is alert and oriented to person, place, and time. She has normal reflexes. No cranial nerve deficit.  Skin: Skin is warm and dry. No rash noted.  the recent rash that patient made a visit to the emergency room for has apparently resolved  Psychiatric: She has a normal mood and affect. Her behavior is normal. Judgment and thought content normal.   BP 144/81  Pulse 86  Temp(Src) 97.5 F (36.4 C) (Oral)  Ht 5' 6"  (1.676 m)  Wt 179 lb (81.194 kg)  BMI 28.91 kg/m2        Assessment & Plan:  1. Diabetes type 2, uncontrolled- on insulin pump - POCT CBC - POCT glycosylated hemoglobin (Hb A1C) - POCT UA -  Microalbumin  2. Essential hypertension, benign - POCT CBC - POCT UA - Microalbumin - BMP8+EGFR - Hepatic function panel  3. Gastroesophageal reflux disease, esophagitis presence not specified - POCT CBC  4. HYPERLIPIDEMIA - POCT CBC - NMR, lipoprofile  5. Vitamin D deficiency - Vit D  25 hydroxy (rtn osteoporosis monitoring)  6. Rash and nonspecific skin eruption -Continue and complete medication  Meds ordered this encounter  Medications  . clopidogrel (PLAVIX) 75 MG tablet    Sig: Take 1 tablet (75 mg total) by mouth daily.    Dispense:  90 tablet    Refill:  3  . furosemide (LASIX) 40 MG tablet    Sig: Take 1 tablet (40 mg total) by mouth 2 (two) times daily.    Dispense:  180 tablet    Refill:  3  . atenolol (TENORMIN) 50 MG tablet    Sig: Take 1 tablet (50 mg total) by mouth 2 (two) times daily.  Dispense:  180 tablet    Refill:  3  . zolpidem (AMBIEN) 10 MG tablet    Sig: Take 0.5-1 tablets (5-10 mg total) by mouth at bedtime as needed for sleep.    Dispense:  90 tablet    Refill:  1  . ALPRAZolam (XANAX) 0.5 MG tablet    Sig: Take 1 tablet (0.5 mg total) by mouth 3 (three) times daily as needed. *prescribed one tablet three times daily as needed*    Dispense:  90 tablet    Refill:  2   Patient Instructions                       Medicare Annual Wellness Visit  [facility] and the medical providers at [facility] strive to bring you the best medical care.  In doing so we not only want to address your current medical conditions and concerns but also to detect new conditions early and prevent illness, disease and health-related problems.    Medicare offers a yearly Wellness Visit which allows our clinical staff to assess your need for preventative services including immunizations, lifestyle education, counseling to decrease risk of preventable diseases and screening for fall risk and other medical concerns.    This visit is provided free  of charge (no copay) for all Medicare recipients. The clinical pharmacists at [facility] have begun to conduct these Wellness Visits which will also include a thorough review of all your medications.    As you primary medical provider recommend that you make an appointment for your Annual Wellness Visit if you have not done so already this year.  You may set up this appointment before you leave today or you may call back ([phone]) and schedule an appointment.  Please make sure when you call that you mention that you are scheduling your Annual Wellness Visit with the clinical pharmacist so that the appointment may be made for the proper length of time.     Continue current medications. Continue good therapeutic lifestyle changes which include good diet and exercise. Fall precautions discussed with patient. If an FOBT was given today- please return it to our front desk. If you are over 11 years old - you may need Prevnar 17 or the adult Pneumonia vaccine.  Continue to follow up with a clinical pharmacist for blood sugar control And exercise regularly Finish the medication started by the emergency room physician for the rash   Arrie Senate MD

## 2014-04-26 NOTE — Patient Instructions (Addendum)
Medicare Annual Wellness Visit  [facility] and the medical providers at [facility] strive to bring you the best medical care.  In doing so we not only want to address your current medical conditions and concerns but also to detect new conditions early and prevent illness, disease and health-related problems.    Medicare offers a yearly Wellness Visit which allows our clinical staff to assess your need for preventative services including immunizations, lifestyle education, counseling to decrease risk of preventable diseases and screening for fall risk and other medical concerns.    This visit is provided free of charge (no copay) for all Medicare recipients. The clinical pharmacists at [facility] have begun to conduct these Wellness Visits which will also include a thorough review of all your medications.    As you primary medical provider recommend that you make an appointment for your Annual Wellness Visit if you have not done so already this year.  You may set up this appointment before you leave today or you may call back ([phone]) and schedule an appointment.  Please make sure when you call that you mention that you are scheduling your Annual Wellness Visit with the clinical pharmacist so that the appointment may be made for the proper length of time.     Continue current medications. Continue good therapeutic lifestyle changes which include good diet and exercise. Fall precautions discussed with patient. If an FOBT was given today- please return it to our front desk. If you are over 27 years old - you may need Prevnar 49 or the adult Pneumonia vaccine.  Continue to follow up with a clinical pharmacist for blood sugar control And exercise regularly Finish the medication started by the emergency room physician for the rash

## 2014-04-27 LAB — HEPATIC FUNCTION PANEL
ALBUMIN: 4.1 g/dL (ref 3.6–4.8)
ALT: 28 IU/L (ref 0–32)
AST: 23 IU/L (ref 0–40)
Alkaline Phosphatase: 73 IU/L (ref 39–117)
BILIRUBIN DIRECT: 0.11 mg/dL (ref 0.00–0.40)
BILIRUBIN TOTAL: 0.4 mg/dL (ref 0.0–1.2)
TOTAL PROTEIN: 7 g/dL (ref 6.0–8.5)

## 2014-04-27 LAB — NMR, LIPOPROFILE
CHOLESTEROL: 285 mg/dL — AB (ref 100–199)
HDL Cholesterol by NMR: 32 mg/dL — ABNORMAL LOW (ref >39)
HDL Particle Number: 22.2 umol/L — ABNORMAL LOW (ref >=30.5)
LDL Particle Number: 3285 nmol/L — ABNORMAL HIGH (ref <1000)
LDL SIZE: 19.9 nm (ref >20.5)
LDLC SERPL CALC-MCNC: 195 mg/dL — ABNORMAL HIGH (ref 0–99)
LP-IR Score: 79 — ABNORMAL HIGH (ref <=45)
TRIGLYCERIDES BY NMR: 289 mg/dL — AB (ref 0–149)

## 2014-04-27 LAB — BMP8+EGFR
BUN/Creatinine Ratio: 11 (ref 11–26)
BUN: 11 mg/dL (ref 8–27)
CO2: 25 mmol/L (ref 18–29)
Calcium: 9.2 mg/dL (ref 8.7–10.3)
Chloride: 96 mmol/L — ABNORMAL LOW (ref 97–108)
Creatinine, Ser: 1.01 mg/dL — ABNORMAL HIGH (ref 0.57–1.00)
GFR calc Af Amer: 68 mL/min/{1.73_m2} (ref >59)
GFR, EST NON AFRICAN AMERICAN: 59 mL/min/{1.73_m2} — AB (ref >59)
GLUCOSE: 229 mg/dL — AB (ref 65–99)
Potassium: 3.5 mmol/L (ref 3.5–5.2)
Sodium: 137 mmol/L (ref 134–144)

## 2014-04-27 LAB — VITAMIN D 25 HYDROXY (VIT D DEFICIENCY, FRACTURES): Vit D, 25-Hydroxy: 14.3 ng/mL — ABNORMAL LOW (ref 30.0–100.0)

## 2014-05-06 ENCOUNTER — Ambulatory Visit: Payer: Self-pay

## 2014-06-18 ENCOUNTER — Other Ambulatory Visit: Payer: Self-pay | Admitting: Nurse Practitioner

## 2014-06-21 NOTE — Telephone Encounter (Signed)
Called in.

## 2014-06-21 NOTE — Telephone Encounter (Signed)
Patient last seen in office on 04-26-14. Rx last filled on 04-19-14 for #90. Please advise on refill. If approved please route to Pool A so nurse can phone in to pharmacy

## 2014-06-21 NOTE — Telephone Encounter (Signed)
This is okay refill x1

## 2014-08-09 ENCOUNTER — Other Ambulatory Visit: Payer: Self-pay | Admitting: Family Medicine

## 2014-08-09 ENCOUNTER — Telehealth: Payer: Self-pay | Admitting: *Deleted

## 2014-08-09 NOTE — Telephone Encounter (Signed)
This is okay to refill 

## 2014-08-09 NOTE — Telephone Encounter (Signed)
Xanax refill called in. 

## 2014-08-09 NOTE — Telephone Encounter (Signed)
Last filled 06/21/14, Route to pool A, nurse call into [facility]

## 2014-08-14 ENCOUNTER — Encounter (HOSPITAL_COMMUNITY): Payer: Self-pay | Admitting: *Deleted

## 2014-08-14 ENCOUNTER — Observation Stay (HOSPITAL_COMMUNITY)
Admission: EM | Admit: 2014-08-14 | Discharge: 2014-08-15 | Disposition: A | Discharge status: Home / Self Care | Payer: Medicare HMO | Attending: Internal Medicine | Admitting: Internal Medicine

## 2014-08-14 ENCOUNTER — Emergency Department (HOSPITAL_COMMUNITY): Payer: Medicare HMO

## 2014-08-14 ENCOUNTER — Observation Stay (HOSPITAL_COMMUNITY): Payer: Medicare HMO

## 2014-08-14 DIAGNOSIS — G35 Multiple sclerosis: Secondary | ICD-10-CM | POA: Insufficient documentation

## 2014-08-14 DIAGNOSIS — I739 Peripheral vascular disease, unspecified: Secondary | ICD-10-CM | POA: Insufficient documentation

## 2014-08-14 DIAGNOSIS — F419 Anxiety disorder, unspecified: Secondary | ICD-10-CM | POA: Insufficient documentation

## 2014-08-14 DIAGNOSIS — Z8701 Personal history of pneumonia (recurrent): Secondary | ICD-10-CM | POA: Insufficient documentation

## 2014-08-14 DIAGNOSIS — G43909 Migraine, unspecified, not intractable, without status migrainosus: Secondary | ICD-10-CM | POA: Diagnosis not present

## 2014-08-14 DIAGNOSIS — Z7902 Long term (current) use of antithrombotics/antiplatelets: Secondary | ICD-10-CM | POA: Insufficient documentation

## 2014-08-14 DIAGNOSIS — E785 Hyperlipidemia, unspecified: Secondary | ICD-10-CM | POA: Diagnosis not present

## 2014-08-14 DIAGNOSIS — K219 Gastro-esophageal reflux disease without esophagitis: Secondary | ICD-10-CM | POA: Insufficient documentation

## 2014-08-14 DIAGNOSIS — E782 Mixed hyperlipidemia: Secondary | ICD-10-CM | POA: Diagnosis present

## 2014-08-14 DIAGNOSIS — N814 Uterovaginal prolapse, unspecified: Secondary | ICD-10-CM | POA: Diagnosis not present

## 2014-08-14 DIAGNOSIS — K5732 Diverticulitis of large intestine without perforation or abscess without bleeding: Secondary | ICD-10-CM | POA: Diagnosis not present

## 2014-08-14 DIAGNOSIS — Z79899 Other long term (current) drug therapy: Secondary | ICD-10-CM | POA: Diagnosis not present

## 2014-08-14 DIAGNOSIS — I10 Essential (primary) hypertension: Secondary | ICD-10-CM | POA: Diagnosis present

## 2014-08-14 DIAGNOSIS — G459 Transient cerebral ischemic attack, unspecified: Principal | ICD-10-CM | POA: Insufficient documentation

## 2014-08-14 DIAGNOSIS — I252 Old myocardial infarction: Secondary | ICD-10-CM | POA: Diagnosis not present

## 2014-08-14 DIAGNOSIS — H269 Unspecified cataract: Secondary | ICD-10-CM | POA: Insufficient documentation

## 2014-08-14 DIAGNOSIS — E119 Type 2 diabetes mellitus without complications: Secondary | ICD-10-CM | POA: Diagnosis not present

## 2014-08-14 DIAGNOSIS — Z7982 Long term (current) use of aspirin: Secondary | ICD-10-CM | POA: Insufficient documentation

## 2014-08-14 DIAGNOSIS — Z794 Long term (current) use of insulin: Secondary | ICD-10-CM | POA: Diagnosis not present

## 2014-08-14 DIAGNOSIS — G458 Other transient cerebral ischemic attacks and related syndromes: Secondary | ICD-10-CM

## 2014-08-14 DIAGNOSIS — I471 Supraventricular tachycardia: Secondary | ICD-10-CM | POA: Diagnosis not present

## 2014-08-14 DIAGNOSIS — I509 Heart failure, unspecified: Secondary | ICD-10-CM | POA: Insufficient documentation

## 2014-08-14 DIAGNOSIS — I2510 Atherosclerotic heart disease of native coronary artery without angina pectoris: Secondary | ICD-10-CM | POA: Diagnosis present

## 2014-08-14 DIAGNOSIS — E1169 Type 2 diabetes mellitus with other specified complication: Secondary | ICD-10-CM | POA: Diagnosis present

## 2014-08-14 DIAGNOSIS — F329 Major depressive disorder, single episode, unspecified: Secondary | ICD-10-CM | POA: Diagnosis not present

## 2014-08-14 DIAGNOSIS — I25110 Atherosclerotic heart disease of native coronary artery with unstable angina pectoris: Secondary | ICD-10-CM | POA: Diagnosis present

## 2014-08-14 DIAGNOSIS — I25119 Atherosclerotic heart disease of native coronary artery with unspecified angina pectoris: Secondary | ICD-10-CM | POA: Diagnosis present

## 2014-08-14 DIAGNOSIS — E1165 Type 2 diabetes mellitus with hyperglycemia: Secondary | ICD-10-CM

## 2014-08-14 DIAGNOSIS — E1159 Type 2 diabetes mellitus with other circulatory complications: Secondary | ICD-10-CM | POA: Diagnosis present

## 2014-08-14 DIAGNOSIS — Z8673 Personal history of transient ischemic attack (TIA), and cerebral infarction without residual deficits: Secondary | ICD-10-CM | POA: Diagnosis present

## 2014-08-14 DIAGNOSIS — R4701 Aphasia: Secondary | ICD-10-CM | POA: Diagnosis present

## 2014-08-14 LAB — DIFFERENTIAL
BASOS ABS: 0 10*3/uL (ref 0.0–0.1)
Basophils Relative: 0 % (ref 0–1)
Eosinophils Absolute: 0.2 10*3/uL (ref 0.0–0.7)
Eosinophils Relative: 3 % (ref 0–5)
Lymphocytes Relative: 35 % (ref 12–46)
Lymphs Abs: 2.6 10*3/uL (ref 0.7–4.0)
MONO ABS: 0.3 10*3/uL (ref 0.1–1.0)
Monocytes Relative: 4 % (ref 3–12)
NEUTROS ABS: 4.2 10*3/uL (ref 1.7–7.7)
Neutrophils Relative %: 58 % (ref 43–77)

## 2014-08-14 LAB — URINALYSIS, ROUTINE W REFLEX MICROSCOPIC
Bilirubin Urine: NEGATIVE
Glucose, UA: >1000 mg/dL — AB
Ketones, ur: NEGATIVE mg/dL
Leukocytes, UA: NEGATIVE
NITRITE: NEGATIVE
Protein, ur: NEGATIVE mg/dL
Specific Gravity, Urine: 1.01 (ref 1.005–1.030)
UROBILINOGEN UA: 2 mg/dL — AB (ref 0.0–1.0)
pH: 6.5 (ref 5.0–8.0)

## 2014-08-14 LAB — RAPID URINE DRUG SCREEN, HOSP PERFORMED
Amphetamines: NOT DETECTED
BARBITURATES: NOT DETECTED
Benzodiazepines: POSITIVE — AB
Cocaine: NOT DETECTED
Opiates: NOT DETECTED
TETRAHYDROCANNABINOL: NOT DETECTED

## 2014-08-14 LAB — COMPREHENSIVE METABOLIC PANEL
ALT: 26 U/L (ref 0–35)
AST: 31 U/L (ref 0–37)
Albumin: 3.4 g/dL — ABNORMAL LOW (ref 3.5–5.2)
Alkaline Phosphatase: 54 U/L (ref 39–117)
Anion gap: 11 (ref 5–15)
BILIRUBIN TOTAL: 0.5 mg/dL (ref 0.3–1.2)
BUN: 10 mg/dL (ref 6–23)
CHLORIDE: 94 meq/L — AB (ref 96–112)
CO2: 30 meq/L (ref 19–32)
CREATININE: 0.82 mg/dL (ref 0.50–1.10)
Calcium: 9.2 mg/dL (ref 8.4–10.5)
GFR calc Af Amer: 85 mL/min — ABNORMAL LOW (ref >90)
GFR, EST NON AFRICAN AMERICAN: 73 mL/min — AB (ref >90)
Glucose, Bld: 276 mg/dL — ABNORMAL HIGH (ref 70–99)
Potassium: 3.3 mEq/L — ABNORMAL LOW (ref 3.7–5.3)
SODIUM: 135 meq/L — AB (ref 137–147)
Total Protein: 6.9 g/dL (ref 6.0–8.3)

## 2014-08-14 LAB — URINE MICROSCOPIC-ADD ON

## 2014-08-14 LAB — PROTIME-INR
INR: 0.99 (ref 0.00–1.49)
Prothrombin Time: 13.2 seconds (ref 11.6–15.2)

## 2014-08-14 LAB — I-STAT CHEM 8, ED
BUN: 8 mg/dL (ref 6–23)
CALCIUM ION: 1.15 mmol/L (ref 1.13–1.30)
Chloride: 97 mEq/L (ref 96–112)
Creatinine, Ser: 0.8 mg/dL (ref 0.50–1.10)
Glucose, Bld: 275 mg/dL — ABNORMAL HIGH (ref 70–99)
HCT: 41 % (ref 36.0–46.0)
Hemoglobin: 13.9 g/dL (ref 12.0–15.0)
Potassium: 3.2 mEq/L — ABNORMAL LOW (ref 3.7–5.3)
SODIUM: 138 meq/L (ref 137–147)
TCO2: 28 mmol/L (ref 0–100)

## 2014-08-14 LAB — GLUCOSE, CAPILLARY
Glucose-Capillary: 151 mg/dL — ABNORMAL HIGH (ref 70–99)
Glucose-Capillary: 335 mg/dL — ABNORMAL HIGH (ref 70–99)

## 2014-08-14 LAB — CBC
HCT: 39.6 % (ref 36.0–46.0)
Hemoglobin: 14 g/dL (ref 12.0–15.0)
MCH: 30.1 pg (ref 26.0–34.0)
MCHC: 35.4 g/dL (ref 30.0–36.0)
MCV: 85.2 fL (ref 78.0–100.0)
Platelets: 141 10*3/uL — ABNORMAL LOW (ref 150–400)
RBC: 4.65 MIL/uL (ref 3.87–5.11)
RDW: 13.5 % (ref 11.5–15.5)
WBC: 7.2 10*3/uL (ref 4.0–10.5)

## 2014-08-14 LAB — I-STAT TROPONIN, ED: Troponin i, poc: 0 ng/mL (ref 0.00–0.08)

## 2014-08-14 LAB — ETHANOL: Alcohol, Ethyl (B): <11 mg/dL (ref 0–11)

## 2014-08-14 LAB — APTT: aPTT: 29 seconds (ref 24–37)

## 2014-08-14 MED ORDER — SODIUM CHLORIDE 0.9 % IV SOLN
INTRAVENOUS | Status: DC
  Administered 2014-08-14 – 2014-08-15 (×2): via INTRAVENOUS

## 2014-08-14 MED ORDER — FUROSEMIDE 40 MG PO TABS
40.0000 mg | ORAL_TABLET | Freq: Two times a day (BID) | ORAL | Status: DC
  Administered 2014-08-14 – 2014-08-15 (×2): 40 mg via ORAL
  Filled 2014-08-14 (×2): qty 1

## 2014-08-14 MED ORDER — INSULIN DETEMIR 100 UNIT/ML ~~LOC~~ SOLN
50.0000 [IU] | Freq: Every morning | SUBCUTANEOUS | Status: DC
  Administered 2014-08-15: 50 [IU] via SUBCUTANEOUS
  Filled 2014-08-14 (×2): qty 0.5

## 2014-08-14 MED ORDER — ACETAMINOPHEN 500 MG PO TABS
ORAL_TABLET | ORAL | Status: AC
  Filled 2014-08-14: qty 2

## 2014-08-14 MED ORDER — INSULIN ASPART 100 UNIT/ML ~~LOC~~ SOLN
3.0000 [IU] | Freq: Three times a day (TID) | SUBCUTANEOUS | Status: DC
  Administered 2014-08-14 – 2014-08-15 (×3): 3 [IU] via SUBCUTANEOUS

## 2014-08-14 MED ORDER — EZETIMIBE 10 MG PO TABS
10.0000 mg | ORAL_TABLET | Freq: Every day | ORAL | Status: DC
  Administered 2014-08-14 – 2014-08-15 (×2): 10 mg via ORAL
  Filled 2014-08-14 (×2): qty 1

## 2014-08-14 MED ORDER — ACETAMINOPHEN 500 MG PO TABS
1000.0000 mg | ORAL_TABLET | Freq: Once | ORAL | Status: AC
  Administered 2014-08-14: 1000 mg via ORAL

## 2014-08-14 MED ORDER — VITAMIN B-12 1000 MCG PO TABS
1000.0000 ug | ORAL_TABLET | Freq: Every day | ORAL | Status: DC
  Administered 2014-08-14 – 2014-08-15 (×2): 1000 ug via ORAL
  Filled 2014-08-14 (×2): qty 1

## 2014-08-14 MED ORDER — DIPHENHYDRAMINE HCL 50 MG/ML IJ SOLN
25.0000 mg | Freq: Once | INTRAMUSCULAR | Status: AC
  Administered 2014-08-14: 25 mg via INTRAVENOUS
  Filled 2014-08-14: qty 1

## 2014-08-14 MED ORDER — ASPIRIN EC 325 MG PO TBEC
325.0000 mg | DELAYED_RELEASE_TABLET | Freq: Every day | ORAL | Status: DC
  Administered 2014-08-14 – 2014-08-15 (×2): 325 mg via ORAL
  Filled 2014-08-14 (×2): qty 1

## 2014-08-14 MED ORDER — CITALOPRAM HYDROBROMIDE 20 MG PO TABS
20.0000 mg | ORAL_TABLET | Freq: Every day | ORAL | Status: DC
  Administered 2014-08-14 – 2014-08-15 (×2): 20 mg via ORAL
  Filled 2014-08-14 (×2): qty 1

## 2014-08-14 MED ORDER — ATENOLOL 25 MG PO TABS
50.0000 mg | ORAL_TABLET | Freq: Two times a day (BID) | ORAL | Status: DC
  Administered 2014-08-14 – 2014-08-15 (×2): 50 mg via ORAL
  Filled 2014-08-14 (×2): qty 2

## 2014-08-14 MED ORDER — INSULIN ASPART 100 UNIT/ML ~~LOC~~ SOLN
0.0000 [IU] | Freq: Three times a day (TID) | SUBCUTANEOUS | Status: DC
  Administered 2014-08-14: 2 [IU] via SUBCUTANEOUS
  Administered 2014-08-14: 7 [IU] via SUBCUTANEOUS
  Administered 2014-08-15: 3 [IU] via SUBCUTANEOUS
  Administered 2014-08-15: 5 [IU] via SUBCUTANEOUS

## 2014-08-14 MED ORDER — NITROGLYCERIN 0.4 MG SL SUBL: 0.4000 mg | SUBLINGUAL_TABLET | SUBLINGUAL | Status: DC | PRN

## 2014-08-14 MED ORDER — CLOPIDOGREL BISULFATE 75 MG PO TABS
75.0000 mg | ORAL_TABLET | Freq: Every day | ORAL | Status: DC
  Administered 2014-08-14 – 2014-08-15 (×2): 75 mg via ORAL
  Filled 2014-08-14 (×2): qty 1

## 2014-08-14 MED ORDER — ROSUVASTATIN CALCIUM 20 MG PO TABS
40.0000 mg | ORAL_TABLET | Freq: Every day | ORAL | Status: DC
  Administered 2014-08-14 – 2014-08-15 (×2): 40 mg via ORAL
  Filled 2014-08-14 (×2): qty 2

## 2014-08-14 MED ORDER — STROKE: EARLY STAGES OF RECOVERY BOOK
Freq: Once | Status: AC
  Administered 2014-08-15: 10:00:00
  Filled 2014-08-14: qty 1

## 2014-08-14 MED ORDER — ALPRAZOLAM 0.5 MG PO TABS
0.5000 mg | ORAL_TABLET | Freq: Three times a day (TID) | ORAL | Status: DC | PRN
  Administered 2014-08-14 – 2014-08-15 (×2): 0.5 mg via ORAL
  Filled 2014-08-14 (×2): qty 1

## 2014-08-14 MED ORDER — PROCHLORPERAZINE EDISYLATE 5 MG/ML IJ SOLN
10.0000 mg | Freq: Once | INTRAMUSCULAR | Status: AC
Start: 2014-08-14 — End: 2014-08-14
  Administered 2014-08-14: 10 mg via INTRAVENOUS
  Filled 2014-08-14: qty 2

## 2014-08-14 NOTE — ED Notes (Signed)
Patient was last normal at home at 0930.  Patient had sudden onset HA w/R sided weakness and slurred speech at 1000.  EMS called at that time.  BP 197/100, CBG 406 per EMS.  Dr. Dina Rich in to see patient on arrival to ER and sent to CT.

## 2014-08-14 NOTE — H&P (Signed)
             [facility]          History and Physical    PCP:   MOORE, DONALD, MD   Chief Complaint:  Right sided weakness, slurred speech  HPI: Patient is a 63 y/o woman with h/o morbid obesity, HTN, DM, hyperlipidemia, CAD s/p stenting in 2008 and prior TIA who presents with the above-mentioned. History is obtained by husband Tim at bedside as patient does not recall the events of today. Tim relates she was last normal at 9;15 am; at 9:30 they went to pick up a stew from their church and deliver it to friends and family and he noticed her staggering towards the right when they got out of the car. She later developed slurred speech. He called 911 and she was brought to the ED. Her symptoms started to resolve fairly quickly and by the time she is seen by the EDP the right sided weakness is completely gone and slurred speech was improving. By the time I see her, all symptoms have resolved. Consult with tele-neurology was obtained and because her deficits were improving quickly, it was decided she was not a candidate for [facility] and decision was made to admit her at [facility].  Allergies:   Allergies  Allergen Reactions  . Iohexol      Desc: pt had syncopal episode with nausea post IV CM late 1990's,  pt has had prednisone prep with heart caths x 2 without problem  kdean 04/16/07, Onset Date: [id number]   . Ticlid [Ticlopidine Hcl] Nausea And Vomiting  . Codeine Nausea And Vomiting and Palpitations      Past Medical History  Diagnosis Date  . Diverticulitis, colon   . MS (multiple sclerosis)     Not confirmed  . Depression   . PVD (peripheral vascular disease)   . HLD (hyperlipidemia)   . Essential hypertension, benign   . CAD (coronary artery disease)     DES to circumflex 02/2007, BMS to LAD and PTCA diagonal 03/2007  . Carotid artery plaque     Mild  . GERD (gastroesophageal reflux disease)   . NSTEMI (non-ST elevated myocardial infarction)     02/2007  . Prolapse of uterus     . TIA (transient ischemic attack) 1980's  . PAT (paroxysmal atrial tachycardia)   . Anxiety   . CHF (congestive heart failure)   . Anginal pain   . Pneumonia 1980's    "once"  . IDDM (insulin dependent diabetes mellitus)   . H/O hiatal hernia   . Migraine     "used to have them really bad; don't have them anymore" (01/07/2014)  . Elevated d-dimer 01/08/2014  . Cataract     Past Surgical History  Procedure Laterality Date  . Appendectomy  ~ 1970  . Cholecystectomy  ?1987  . Breast biopsy Right 1980's  . Colonoscopy  2002    Dr. Anwar--> Severe diverticular changes in the region of the sigmoid and descending colon with scattered diverticular changes throughout the rest of the colon. No polyps, ulcerations. Despite numerous manipulations, the tip of the scope could not be tipped into the cecal area.  . Colonoscopy  01/10/2012    Procedure: COLONOSCOPY;  Surgeon: Robert M Rourk, MD;  Location: [facility];  Service: Endoscopy;  Laterality: N/A;  1:55  . Coronary angioplasty with stent placement  ~ 1997 X 2    "2 + 1"  . Cardiac catheterization  01/07/2014  .   Abdominal hysterectomy  1986    ovaries remain - prolaspe uterus   . Breast lumpectomy Right 1980's    Dr. P. Young   . Eye surgery Bilateral 2014    cataract    Prior to Admission medications   Medication Sig Start Date End Date Taking? Authorizing Provider  ALPRAZolam (XANAX) 0.5 MG tablet TAKE ONE TABLET 3 TIMES A DAY AS NEEDED. 08/09/14  Yes Donald W Moore, MD  aspirin EC 325 MG tablet Take 325 mg by mouth daily.   Yes Historical Provider, MD  atenolol (TENORMIN) 50 MG tablet Take 1 tablet (50 mg total) by mouth 2 (two) times daily. 04/26/14  Yes Donald W Moore, MD  citalopram (CELEXA) 20 MG tablet Take 1 tablet (20 mg total) by mouth daily. 03/08/14  Yes Donald W Moore, MD  clopidogrel (PLAVIX) 75 MG tablet Take 1 tablet (75 mg total) by mouth daily. 04/26/14  Yes Donald W Moore, MD  ezetimibe (ZETIA) 10 MG tablet Take 1  tablet (10 mg total) by mouth daily. 06/11/13  Yes Donald W Moore, MD  furosemide (LASIX) 40 MG tablet Take 1 tablet (40 mg total) by mouth 2 (two) times daily. 04/26/14  Yes Donald W Moore, MD  Insulin Detemir (LEVEMIR FLEXTOUCH) 100 UNIT/ML Pen Inject 50 Units into the skin every morning.   Yes Historical Provider, MD  insulin lispro (HUMALOG KWIKPEN) 100 UNIT/ML KiwkPen Inject 25 Units into the skin 3 (three) times daily.   Yes Historical Provider, MD  Multiple Vitamins-Minerals (EYE VITAMINS PO) Take 1 capsule by mouth daily.   Yes Historical Provider, MD  potassium chloride SA (K-DUR,KLOR-CON) 20 MEQ tablet Take 1 tablet (20 mEq total) by mouth daily. 12/23/13  Yes Donald W Moore, MD  rosuvastatin (CRESTOR) 40 MG tablet Take 1 tablet (40 mg total) by mouth daily. 06/11/13  Yes Donald W Moore, MD  vitamin B-12 (CYANOCOBALAMIN) 1000 MCG tablet Take 1,000 mcg by mouth daily.   Yes Historical Provider, MD  zolpidem (AMBIEN) 10 MG tablet Take 0.5-1 tablets (5-10 mg total) by mouth at bedtime as needed for sleep. 04/26/14  Yes Donald W Moore, MD  esomeprazole (NEXIUM) 40 MG capsule Take 1 capsule (40 mg total) by mouth 2 (two) times daily before a meal. Indigestion- after twice a day for tow weeks resume once a day Patient not taking: Reported on 08/14/2014 01/08/14 04/26/14  Laura R Ingold, NP  isosorbide mononitrate (IMDUR) 120 MG 24 hr tablet Take 120 mg by mouth once a week.    Historical Provider, MD  lisinopril (PRINIVIL,ZESTRIL) 20 MG tablet Take 1 tablet (20 mg total) by mouth 2 (two) times daily. Patient not taking: Reported on 08/14/2014 06/11/13   Donald W Moore, MD  nitroGLYCERIN (NITROSTAT) 0.4 MG SL tablet Place 1 tablet (0.4 mg total) under the tongue every 5 (five) minutes as needed. For chest pain 06/11/13   Donald W Moore, MD  predniSONE (DELTASONE) 20 MG tablet Take 1 tablet (20 mg total) by mouth daily. Patient not taking: Reported on 08/14/2014 04/23/14   Leslie K Sofia, PA-C    Social  History:  reports that she has been passively smoking.  She has never used smokeless tobacco. She reports that she does not drink alcohol or use illicit drugs.  Family History  Problem Relation Age of Onset  . Heart attack Mother 78  . Diabetes Father   . Heart attack Father 99  . Heart attack Brother 30    x6  . Colon cancer   Paternal Aunt     50s, died with brain anuerysm  . Crohn's disease Cousin     paternal    Review of Systems:  Constitutional: Denies fever, chills, diaphoresis, appetite change and fatigue.  HEENT: Denies photophobia, eye pain, redness, hearing loss, ear pain, congestion, sore throat, rhinorrhea, sneezing, mouth sores, trouble swallowing, neck pain, neck stiffness and tinnitus.   Respiratory: Denies SOB, DOE, cough, chest tightness,  and wheezing.   Cardiovascular: Denies chest pain, palpitations and leg swelling.  Gastrointestinal: Denies nausea, vomiting, abdominal pain, diarrhea, constipation, blood in stool and abdominal distention.  Genitourinary: Denies dysuria, urgency, frequency, hematuria, flank pain and difficulty urinating.  Endocrine: Denies: hot or cold intolerance, sweats, changes in hair or nails, polyuria, polydipsia. Musculoskeletal: Denies myalgias, back pain, joint swelling, arthralgias and gait problem.  Skin: Denies pallor, rash and wound.  Neurological: Denies dizziness, seizures, syncope, weakness, light-headedness, numbness. Hematological: Denies adenopathy. Easy bruising, personal or family bleeding history  Psychiatric/Behavioral: Denies suicidal ideation, mood changes, confusion, nervousness, sleep disturbance and agitation   Physical Exam: Blood pressure 189/78, pulse 81, temperature 97.9 F (36.6 C), temperature source Oral, resp. rate 15, height 5' 6" (1.676 m), weight 83.15 kg (183 lb 5 oz), SpO2 97 %. Gen: AA Ox3, fatigued HEENT: [place]/AT/PERRL Neck: supple, no JVD, no LAD, no bruits, no goiter CV: RRR +SEM Lungs: CTA B Abd:  S/NT/ND/+BS Ext: no C/C/E Neuro:intact and non-focal by the time I see her  Labs on Admission:  Results for orders placed or performed during the hospital encounter of 08/14/14 (from the past 48 hour(s))  Ethanol     Status: None   Collection Time: 08/14/14 11:21 AM  Result Value Ref Range   Alcohol, Ethyl (B) <11 0 - 11 mg/dL    Comment:        LOWEST DETECTABLE LIMIT FOR SERUM ALCOHOL IS 11 mg/dL FOR MEDICAL PURPOSES ONLY   Protime-INR     Status: None   Collection Time: 08/14/14 11:21 AM  Result Value Ref Range   Prothrombin Time 13.2 11.6 - 15.2 seconds   INR 0.99 0.00 - 1.49  APTT     Status: None   Collection Time: 08/14/14 11:21 AM  Result Value Ref Range   aPTT 29 24 - 37 seconds  CBC     Status: Abnormal   Collection Time: 08/14/14 11:21 AM  Result Value Ref Range   WBC 7.2 4.0 - 10.5 K/uL   RBC 4.65 3.87 - 5.11 MIL/uL   Hemoglobin 14.0 12.0 - 15.0 g/dL   HCT 39.6 36.0 - 46.0 %   MCV 85.2 78.0 - 100.0 fL   MCH 30.1 26.0 - 34.0 pg   MCHC 35.4 30.0 - 36.0 g/dL   RDW 13.5 11.5 - 15.5 %   Platelets 141 (L) 150 - 400 K/uL  Differential     Status: None   Collection Time: 08/14/14 11:21 AM  Result Value Ref Range   Neutrophils Relative % 58 43 - 77 %   Neutro Abs 4.2 1.7 - 7.7 K/uL   Lymphocytes Relative 35 12 - 46 %   Lymphs Abs 2.6 0.7 - 4.0 K/uL   Monocytes Relative 4 3 - 12 %   Monocytes Absolute 0.3 0.1 - 1.0 K/uL   Eosinophils Relative 3 0 - 5 %   Eosinophils Absolute 0.2 0.0 - 0.7 K/uL   Basophils Relative 0 0 - 1 %   Basophils Absolute 0.0 0.0 - 0.1 K/uL  Comprehensive metabolic panel       Status: Abnormal   Collection Time: 08/14/14 11:21 AM  Result Value Ref Range   Sodium 135 (L) 137 - 147 mEq/L   Potassium 3.3 (L) 3.7 - 5.3 mEq/L   Chloride 94 (L) 96 - 112 mEq/L   CO2 30 19 - 32 mEq/L   Glucose, Bld 276 (H) 70 - 99 mg/dL   BUN 10 6 - 23 mg/dL   Creatinine, Ser 0.82 0.50 - 1.10 mg/dL   Calcium 9.2 8.4 - 10.5 mg/dL   Total Protein 6.9 6.0 - 8.3  g/dL   Albumin 3.4 (L) 3.5 - 5.2 g/dL   AST 31 0 - 37 U/L   ALT 26 0 - 35 U/L   Alkaline Phosphatase 54 39 - 117 U/L   Total Bilirubin 0.5 0.3 - 1.2 mg/dL   GFR calc non Af Amer 73 (L) >90 mL/min   GFR calc Af Amer 85 (L) >90 mL/min    Comment: (NOTE) The eGFR has been calculated using the CKD EPI equation. This calculation has not been validated in all clinical situations. eGFR's persistently <90 mL/min signify possible Chronic Kidney Disease.    Anion gap 11 5 - 15  I-Stat Troponin, ED (not at [facility])     Status: None   Collection Time: 08/14/14 11:29 AM  Result Value Ref Range   Troponin i, poc 0.00 0.00 - 0.08 ng/mL   Comment 3            Comment: Due to the release kinetics of cTnI, a negative result within the first hours of the onset of symptoms does not rule out myocardial infarction with certainty. If myocardial infarction is still suspected, repeat the test at appropriate intervals.   I-Stat Chem 8, ED     Status: Abnormal   Collection Time: 08/14/14 11:30 AM  Result Value Ref Range   Sodium 138 137 - 147 mEq/L   Potassium 3.2 (L) 3.7 - 5.3 mEq/L   Chloride 97 96 - 112 mEq/L   BUN 8 6 - 23 mg/dL   Creatinine, Ser 0.80 0.50 - 1.10 mg/dL   Glucose, Bld 275 (H) 70 - 99 mg/dL   Calcium, Ion 1.15 1.13 - 1.30 mmol/L   TCO2 28 0 - 100 mmol/L   Hemoglobin 13.9 12.0 - 15.0 g/dL   HCT 41.0 36.0 - 46.0 %  Urine Drug Screen     Status: Abnormal   Collection Time: 08/14/14 11:57 AM  Result Value Ref Range   Opiates NONE DETECTED NONE DETECTED   Cocaine NONE DETECTED NONE DETECTED   Benzodiazepines POSITIVE (A) NONE DETECTED   Amphetamines NONE DETECTED NONE DETECTED   Tetrahydrocannabinol NONE DETECTED NONE DETECTED   Barbiturates NONE DETECTED NONE DETECTED    Comment:        DRUG SCREEN FOR MEDICAL PURPOSES ONLY.  IF CONFIRMATION IS NEEDED FOR ANY PURPOSE, NOTIFY LAB WITHIN 5 DAYS.        LOWEST DETECTABLE LIMITS FOR URINE DRUG SCREEN Drug Class       Cutoff  (ng/mL) Amphetamine      1000 Barbiturate      200 Benzodiazepine   423 Tricyclics       953 Opiates          300 Cocaine          300 THC              50   Urinalysis, Routine w reflex microscopic     Status: Abnormal   Collection Time: 08/14/14  11:57 AM  Result Value Ref Range   Color, Urine YELLOW YELLOW   APPearance HAZY (A) CLEAR   Specific Gravity, Urine 1.010 1.005 - 1.030   pH 6.5 5.0 - 8.0   Glucose, UA >1000 (A) NEGATIVE mg/dL   Hgb urine dipstick MODERATE (A) NEGATIVE   Bilirubin Urine NEGATIVE NEGATIVE   Ketones, ur NEGATIVE NEGATIVE mg/dL   Protein, ur NEGATIVE NEGATIVE mg/dL   Urobilinogen, UA 2.0 (H) 0.0 - 1.0 mg/dL   Nitrite NEGATIVE NEGATIVE   Leukocytes, UA NEGATIVE NEGATIVE  Urine microscopic-add on     Status: Abnormal   Collection Time: 08/14/14 11:57 AM  Result Value Ref Range   Squamous Epithelial / LPF FEW (A) RARE   RBC / HPF 0-2 <3 RBC/hpf   Urine-Other MICROSCOPIC EXAM PERFORMED ON UNCONCENTRATED URINE   Glucose, capillary     Status: Abnormal   Collection Time: 08/14/14  4:48 PM  Result Value Ref Range   Glucose-Capillary 151 (H) 70 - 99 mg/dL   Comment 1 Notify RN     Radiological Exams on Admission: Ct Head Wo Contrast  08/14/2014   CLINICAL DATA:  Code stroke. Right-sided weakness and slurred speech 1 hr 45 min.  EXAM: CT HEAD WITHOUT CONTRAST  TECHNIQUE: Contiguous axial images were obtained from the base of the skull through the vertex without intravenous contrast.  COMPARISON:  MRI brain 06/26/2010  FINDINGS: Ventricles, cisterns and other CSF spaces are within normal. No evidence of mass, mass effect, shift of midline structures or acute hemorrhage. No evidence to suggest acute infarction. Remaining bones and soft tissues are unremarkable.  IMPRESSION: No acute intracranial findings.  These results were called by telephone at the time of interpretation on 08/14/2014 at 11:32 am to Dr. COURTNEY, HORTON , who verbally acknowledged these  results.   Electronically Signed   By: Daniel  Boyle M.D.   On: 08/14/2014 11:32   Mr Angiogram Head Wo Contrast  08/14/2014   CLINICAL DATA:  63-year-old female with headache and right side weakness for 1 day. TIA. Initial encounter.  EXAM: MRI HEAD WITHOUT CONTRAST  MRA HEAD WITHOUT CONTRAST  TECHNIQUE: Multiplanar, multiecho pulse sequences of the brain and surrounding structures were obtained without intravenous contrast. Angiographic images of the head were obtained using MRA technique without contrast.  COMPARISON:  Head CT without contrast 08/14/2014 [place] [facility]. Brain MRI and MRA 06/26/2010  FINDINGS: MRI HEAD FINDINGS  Stable cerebral volume since 2011. Major intracranial vascular flow voids are stable. No restricted diffusion to suggest acute infarction. No midline shift, mass effect, evidence of mass lesion, ventriculomegaly, extra-axial collection or acute intracranial hemorrhage. Cervicomedullary junction and pituitary are within normal limits. Negative visualized cervical spine.  Small scattered cerebral white matter T2 and FLAIR hyperintensity was more apparent in 2011, perhaps due to motion artifact today. Configuration is nonspecific. No new or progressed signal abnormality identified. No cortical encephalomalacia.  Visible internal auditory structures appear normal. New mild mastoid effusion on the left. Stable and negative visualized nasopharynx. Right mastoids remain clear. New scattered paranasal sinus mucosal thickening and opacification, mild except in the left frontal sinus were there is moderate involvement as well as bubbly opacity.  Interval postoperative changes to both globes. Visualized scalp soft tissues are within normal limits. Normal bone marrow signal.  MRA HEAD FINDINGS  Stable antegrade flow in the posterior circulation with mildly dominant distal left vertebral artery. Normal right PICA origin and vertebrobasilar junction. Basilar artery stable and within normal  limits. SCA and left   PCA origins remain normal. Fetal right PCA origin re - identified. Bilateral PCA branches are stable.  Stable antegrade flow in both ICA siphons. Ophthalmic and right posterior communicating artery origins are normal. Left posterior communicating artery diminutive or absent as before. Carotid termini, MCA and ACA origins are stable and within normal limits. Left A1 segment is dominant. Anterior communicating artery is diminutive or absent. Visualized bilateral ACA branches are stable and within normal limits. Mild artifactual signal loss in the bilateral MCA branches, symmetrically affecting the distal M1 segments. Visualized bilateral MCA branches otherwise are stable and within normal limits.  IMPRESSION: 1.  No acute intracranial abnormality. 2. New mild to moderate paranasal sinus and left mastoid air cell inflammatory changes. 3. Stable and negative intracranial MRA.   Electronically Signed   By: Lee  Hall M.D.   On: 08/14/2014 14:00   Mr Brain Wo Contrast  08/14/2014   CLINICAL DATA:  63-year-old female with headache and right side weakness for 1 day. TIA. Initial encounter.  EXAM: MRI HEAD WITHOUT CONTRAST  MRA HEAD WITHOUT CONTRAST  TECHNIQUE: Multiplanar, multiecho pulse sequences of the brain and surrounding structures were obtained without intravenous contrast. Angiographic images of the head were obtained using MRA technique without contrast.  COMPARISON:  Head CT without contrast 08/14/2014 [place] [facility]. Brain MRI and MRA 06/26/2010  FINDINGS: MRI HEAD FINDINGS  Stable cerebral volume since 2011. Major intracranial vascular flow voids are stable. No restricted diffusion to suggest acute infarction. No midline shift, mass effect, evidence of mass lesion, ventriculomegaly, extra-axial collection or acute intracranial hemorrhage. Cervicomedullary junction and pituitary are within normal limits. Negative visualized cervical spine.  Small scattered cerebral white matter T2 and  FLAIR hyperintensity was more apparent in 2011, perhaps due to motion artifact today. Configuration is nonspecific. No new or progressed signal abnormality identified. No cortical encephalomalacia.  Visible internal auditory structures appear normal. New mild mastoid effusion on the left. Stable and negative visualized nasopharynx. Right mastoids remain clear. New scattered paranasal sinus mucosal thickening and opacification, mild except in the left frontal sinus were there is moderate involvement as well as bubbly opacity.  Interval postoperative changes to both globes. Visualized scalp soft tissues are within normal limits. Normal bone marrow signal.  MRA HEAD FINDINGS  Stable antegrade flow in the posterior circulation with mildly dominant distal left vertebral artery. Normal right PICA origin and vertebrobasilar junction. Basilar artery stable and within normal limits. SCA and left PCA origins remain normal. Fetal right PCA origin re - identified. Bilateral PCA branches are stable.  Stable antegrade flow in both ICA siphons. Ophthalmic and right posterior communicating artery origins are normal. Left posterior communicating artery diminutive or absent as before. Carotid termini, MCA and ACA origins are stable and within normal limits. Left A1 segment is dominant. Anterior communicating artery is diminutive or absent. Visualized bilateral ACA branches are stable and within normal limits. Mild artifactual signal loss in the bilateral MCA branches, symmetrically affecting the distal M1 segments. Visualized bilateral MCA branches otherwise are stable and within normal limits.  IMPRESSION: 1.  No acute intracranial abnormality. 2. New mild to moderate paranasal sinus and left mastoid air cell inflammatory changes. 3. Stable and negative intracranial MRA.   Electronically Signed   By: Lee  Hall M.D.   On: 08/14/2014 14:00    Assessment/Plan Principal Problem:   TIA (transient ischemic attack) Active Problems:    Hyperlipidemia   CAD, NATIVE VESSEL, cath 01/07/14 non obstructive coronary disease   Essential hypertension, benign     DM type 2 with diabetic dyslipidemia    TIA -MRI negative for CVA. -Per tele neuro consult, continue ASA/plavix. -ECHO/dopplers. -PT evaluation.  HTN -Continue home meds.  DM -Check A 1c. -Continue levemir and SSI.  CAD -Stable. -No CP.  DVT Prophylaxis -SCDs.  Code Status -Full Code  Time Spent on Admission: 80 minutes.  Lelon Frohlich [facility] Pager: [phone] 08/14/2014, 5:25 PM

## 2014-08-14 NOTE — ED Notes (Signed)
Spoke w/Radiology.  MRI tech is on the way in to do patient's study.  Will hold her here until he arrives.

## 2014-08-14 NOTE — Progress Notes (Signed)
Notified MD of critical blood sugar.

## 2014-08-14 NOTE — ED Notes (Signed)
Called [facility] to set-up TeleNeurology Faxed Consult Request Form.

## 2014-08-14 NOTE — Plan of Care (Signed)
Problem: Consults Goal: Ischemic Stroke Patient Education See Patient Education Module for education specifics. Outcome: Progressing Goal: Skin Care Protocol Initiated - if Braden Score 18 or less If consults are not indicated, leave blank or document N/A Outcome: Not Applicable Date Met:  08/14/14     

## 2014-08-14 NOTE — ED Provider Notes (Signed)
CSN: [id number]     Arrival date & time 08/14/14  1107 History  This chart was scribed for [place]* by Dellis Filbert, ED Scribe. The patient was seen in [facility] and the patient's care was started at 11:08 AM.    Chief Complaint  Patient presents with  . Weakness  . Aphasia   HPI  HPI Comments:  Patient evaluated at 11:08 am.  [name] is a 63 y.o. female brought in by ambulance, who presents to the Emergency Department complaining of a weakness and aphasia.  Per EMS report, last seen normal at 9:30 AM. Had onset of right-sided weakness and slurred speech at 10 AM which was observed by her husband. EMS was called. Patient reporting left-sided headache. Per EMS report, patient noted to have slurred speech, right upper and lower extremity and inability to ambulate upon their arrival. On my assessment, she has flattening of her nasal labial fold on the right. Otherwise she has intact strength in the right upper extremity with decreased proximal muscle extremity strength of the right lower extremity. No evidence of a fascia at this time. Code stroke initiated.  Level V caveat for acuity of condition.  Past Medical History  Diagnosis Date  . Diverticulitis, colon   . MS (multiple sclerosis)     Not confirmed  . Depression   . PVD (peripheral vascular disease)   . HLD (hyperlipidemia)   . Essential hypertension, benign   . CAD (coronary artery disease)     DES to circumflex 02/2007, BMS to LAD and PTCA diagonal 03/2007  . Carotid artery plaque     Mild  . GERD (gastroesophageal reflux disease)   . NSTEMI (non-ST elevated myocardial infarction)     02/2007  . Prolapse of uterus   . TIA (transient ischemic attack) 1980's  . PAT (paroxysmal atrial tachycardia)   . Anxiety   . CHF (congestive heart failure)   . Anginal pain   . Pneumonia 1980's    "once"  . IDDM (insulin dependent diabetes mellitus)   . H/O hiatal hernia   . Migraine     "used to have them  really bad; don't have them anymore" (01/07/2014)  . Elevated d-dimer 01/08/2014  . Cataract    Past Surgical History  Procedure Laterality Date  . Appendectomy  ~ 1970  . Cholecystectomy  ?1987  . Breast biopsy Right 1980's  . Colonoscopy  2002    Dr. Anwar--> Severe diverticular changes in the region of the sigmoid and descending colon with scattered diverticular changes throughout the rest of the colon. No polyps, ulcerations. Despite numerous manipulations, the tip of the scope could not be tipped into the cecal area.  . Colonoscopy  01/10/2012    Procedure: COLONOSCOPY;  Surgeon: Daneil Dolin, MD;  Location: [facility];  Service: Endoscopy;  Laterality: N/A;  1:55  . Coronary angioplasty with stent placement  ~ 1997 X 2    "2 + 1"  . Cardiac catheterization  01/07/2014  . Abdominal hysterectomy  1986    ovaries remain - prolaspe uterus   . Breast lumpectomy Right 1980's    Dr. Charlynne Pander   . Eye surgery Bilateral 2014    cataract   Family History  Problem Relation Age of Onset  . Heart attack Mother 49  . Diabetes Father   . Heart attack Father 69  . Heart attack Brother 30    x6  . Colon cancer Paternal Aunt  25s, died with brain anuerysm  . Crohn's disease Cousin     paternal   History  Substance Use Topics  . Smoking status: Passive Smoke Exposure - Never Smoker  . Smokeless tobacco: Never Used     Comment: spouse, 52 years - husband has quit 01/2011  . Alcohol Use: No   OB History    No data available     Review of Systems  Unable to perform ROS: Acuity of condition  Neurological: Positive for facial asymmetry, speech difficulty, weakness and headaches.      Allergies  Codeine; Iohexol; and Ticlid  Home Medications   Prior to Admission medications   Medication Sig Start Date End Date Taking? Authorizing Provider  ALPRAZolam Duanne Moron) 0.5 MG tablet TAKE ONE TABLET 3 TIMES A DAY AS NEEDED. 08/09/14   Chipper Herb, MD  aspirin EC 325 MG tablet Take  325 mg by mouth daily.    Historical Provider, MD  atenolol (TENORMIN) 50 MG tablet Take 1 tablet (50 mg total) by mouth 2 (two) times daily. 04/26/14   Chipper Herb, MD  Cholecalciferol (VITAMIN D3) 5000 UNITS CAPS Take 1 capsule by mouth daily.    Historical Provider, MD  citalopram (CELEXA) 20 MG tablet Take 1 tablet (20 mg total) by mouth daily. 03/08/14   Chipper Herb, MD  clopidogrel (PLAVIX) 75 MG tablet Take 1 tablet (75 mg total) by mouth daily. 04/26/14   Chipper Herb, MD  esomeprazole (NEXIUM) 40 MG capsule Take 1 capsule (40 mg total) by mouth 2 (two) times daily before a meal. Indigestion- after twice a day for tow weeks resume once a day 01/08/14 04/26/14  Isaiah Serge, NP  ezetimibe (ZETIA) 10 MG tablet Take 1 tablet (10 mg total) by mouth daily. 06/11/13   Chipper Herb, MD  furosemide (LASIX) 40 MG tablet Take 1 tablet (40 mg total) by mouth 2 (two) times daily. 04/26/14   Chipper Herb, MD  Insulin Detemir (LEVEMIR FLEXTOUCH) 100 UNIT/ML Pen Inject 50 Units into the skin every morning.    Historical Provider, MD  insulin lispro (HUMALOG KWIKPEN) 100 UNIT/ML KiwkPen Inject 25 Units into the skin 3 (three) times daily.    Historical Provider, MD  isosorbide mononitrate (IMDUR) 120 MG 24 hr tablet Take 120 mg by mouth once a week.    Historical Provider, MD  lisinopril (PRINIVIL,ZESTRIL) 20 MG tablet Take 1 tablet (20 mg total) by mouth 2 (two) times daily. 06/11/13   Chipper Herb, MD  Multiple Vitamins-Minerals (EYE VITAMINS PO) Take 1 capsule by mouth daily.    Historical Provider, MD  nitroGLYCERIN (NITROSTAT) 0.4 MG SL tablet Place 1 tablet (0.4 mg total) under the tongue every 5 (five) minutes as needed. For chest pain 06/11/13   Chipper Herb, MD  potassium chloride SA (K-DUR,KLOR-CON) 20 MEQ tablet Take 1 tablet (20 mEq total) by mouth daily. 12/23/13   Chipper Herb, MD  predniSONE (DELTASONE) 20 MG tablet Take 1 tablet (20 mg total) by mouth daily. 04/23/14   Fransico Meadow, PA-C   rosuvastatin (CRESTOR) 40 MG tablet Take 1 tablet (40 mg total) by mouth daily. 06/11/13   Chipper Herb, MD  zolpidem (AMBIEN) 10 MG tablet Take 0.5-1 tablets (5-10 mg total) by mouth at bedtime as needed for sleep. 04/26/14   Chipper Herb, MD   BP 175/75 mmHg  Pulse 84  Temp(Src) 98.7 F (37.1 C) (Oral)  Resp 19  SpO2 95%  Physical Exam  Constitutional: She is oriented to person, place, and time.  Tearful  HENT:  Head: Normocephalic and atraumatic.  Eyes: EOM are normal. Pupils are equal, round, and reactive to light.  Neck: Neck supple.  Cardiovascular: Normal rate, regular rhythm and normal heart sounds.   No murmur heard. Pulmonary/Chest: Effort normal and breath sounds normal. No respiratory distress.  Musculoskeletal: She exhibits no edema.  Neurological: She is alert and oriented to person, place, and time.  Flattening of the right nasolabial fold with mild right facial droop, 5 out of 5 strength in the bilateral upper extremities including grip, biceps, and triceps strength, 4+ out of 5 strength with plantar and dorsiflexion of the right foot, patient unable to hold her right leg up against gravity, patient can name and repeat and is oriented.  Skin: Skin is warm and dry.  Psychiatric: She has a normal mood and affect.  Nursing note and vitals reviewed.   ED Course  Procedures (including critical care time) Labs Review Labs Reviewed  CBC - Abnormal; Notable for the following:    Platelets 141 (*)    All other components within normal limits  COMPREHENSIVE METABOLIC PANEL - Abnormal; Notable for the following:    Sodium 135 (*)    Potassium 3.3 (*)    Chloride 94 (*)    Glucose, Bld 276 (*)    Albumin 3.4 (*)    GFR calc non Af Amer 73 (*)    GFR calc Af Amer 85 (*)    All other components within normal limits  URINE RAPID DRUG SCREEN (HOSP PERFORMED) - Abnormal; Notable for the following:    Benzodiazepines POSITIVE (*)    All other components within normal  limits  URINALYSIS, ROUTINE W REFLEX MICROSCOPIC - Abnormal; Notable for the following:    APPearance HAZY (*)    Glucose, UA >1000 (*)    Hgb urine dipstick MODERATE (*)    Urobilinogen, UA 2.0 (*)    All other components within normal limits  URINE MICROSCOPIC-ADD ON - Abnormal; Notable for the following:    Squamous Epithelial / LPF FEW (*)    All other components within normal limits  I-STAT CHEM 8, ED - Abnormal; Notable for the following:    Potassium 3.2 (*)    Glucose, Bld 275 (*)    All other components within normal limits  ETHANOL  PROTIME-INR  APTT  DIFFERENTIAL  I-STAT TROPOININ, ED  I-STAT TROPOININ, ED    Imaging Review Ct Head Wo Contrast  08/14/2014   CLINICAL DATA:  Code stroke. Right-sided weakness and slurred speech 1 hr 45 min.  EXAM: CT HEAD WITHOUT CONTRAST  TECHNIQUE: Contiguous axial images were obtained from the base of the skull through the vertex without intravenous contrast.  COMPARISON:  MRI brain 06/26/2010  FINDINGS: Ventricles, cisterns and other CSF spaces are within normal. No evidence of mass, mass effect, shift of midline structures or acute hemorrhage. No evidence to suggest acute infarction. Remaining bones and soft tissues are unremarkable.  IMPRESSION: No acute intracranial findings.  These results were called by telephone at the time of interpretation on 08/14/2014 at 11:32 am to Dr. Thayer Jew , who verbally acknowledged these results.   Electronically Signed   By: Marin Olp M.D.   On: 08/14/2014 11:32     EKG Interpretation   Date/Time:  Saturday August 14 2014 11:32:55 EST Ventricular Rate:  87 PR Interval:  196 QRS Duration: 86 QT Interval:  403 QTC Calculation:  485 R Axis:   -30 Text Interpretation:  Sinus rhythm Abnormal R-wave progression, late  transition Inferior infarct, old Confirmed by HORTON  MD, COURTNEY ([id number])  on 08/14/2014 12:29:58 PM      MDM   Final diagnoses:  TIA (transient ischemic attack)    Patient presents with right-sided weakness, facial droop, and aphasia. On initial evaluation, NIH stroke scale 6 with right nasolabial fold flattening and right lower extremity symptoms. Code stroke was initiated.  CT negative. Patient reevaluated upon return from CT and continues to have resolution of her symptoms. She now has no evidence of right facial droop and has improved strength of the right lower extremity. Patient assessed by teleneurology, Dr. Geraldine Solar.  He agrees, patient symptoms have resolved. Will need admission for TIA workup including MRI, MRA, fasting lipids, hemoglobin A1c. Recommends continuing aspirin and Plavix. Patient not a TPA candidate giving resolving symptoms. Will admit. Discussed with Dr. Jerilee Hoh.  After history, exam, and medical workup I feel the patient has been appropriately medically screened and is safe for discharge home. Pertinent diagnoses were discussed with the patient. Patient was given return precautions.    I personally performed the services described in this documentation, which was scribed in my presence. The recorded information has been reviewed and is accurate.     Merryl Hacker, MD 08/14/14 [phone]

## 2014-08-15 ENCOUNTER — Observation Stay (HOSPITAL_COMMUNITY): Payer: Medicare HMO

## 2014-08-15 DIAGNOSIS — I517 Cardiomegaly: Secondary | ICD-10-CM

## 2014-08-15 LAB — LIPID PANEL
CHOLESTEROL: 111 mg/dL (ref 0–200)
HDL: 33 mg/dL — AB (ref >39)
LDL Cholesterol: 47 mg/dL (ref 0–99)
TRIGLYCERIDES: 156 mg/dL — AB (ref <150)
Total CHOL/HDL Ratio: 3.4 RATIO
VLDL: 31 mg/dL (ref 0–40)

## 2014-08-15 LAB — GLUCOSE, CAPILLARY
Glucose-Capillary: 220 mg/dL — ABNORMAL HIGH (ref 70–99)
Glucose-Capillary: 254 mg/dL — ABNORMAL HIGH (ref 70–99)

## 2014-08-15 LAB — HEMOGLOBIN A1C
Hgb A1c MFr Bld: 9.7 % — ABNORMAL HIGH (ref <5.7)
Mean Plasma Glucose: 232 mg/dL — ABNORMAL HIGH (ref <117)

## 2014-08-15 NOTE — Plan of Care (Signed)
Problem: Acute Treatment Outcomes Goal: Neuro exam at baseline or improved Outcome: Progressing Goal: BP within ordered parameters Outcome: Progressing Goal: Airway maintained/protected Outcome: Completed/Met Date Met:  08/15/14 Goal: 02 Sats > 94% Outcome: Completed/Met Date Met:  08/15/14 Goal: Hemodynamically stable Outcome: Completed/Met Date Met:  08/15/14

## 2014-08-15 NOTE — Plan of Care (Signed)
Problem: Consults Goal: Ischemic Stroke Patient Education See Patient Education Module for education specifics.  Outcome: Completed/Met Date Met:  08/15/14 Patient given Stroke Mapping booklet, and we started working through material. Goal: Nutrition Consult-if indicated Outcome: Not Applicable Date Met:  40/33/53 Goal: Diabetes Guidelines if Diabetic/Glucose > 140 If diabetic or lab glucose is > 140 mg/dl - Initiate Diabetes/Hyperglycemia Guidelines & Document Interventions  Outcome: Completed/Met Date Met:  08/15/14 Patient on sliding scale coverage ac with achs checks.  Problem: Acute Treatment Outcomes Goal: Neuro exam at baseline or improved Outcome: Completed/Met Date Met:  08/15/14 Goal: BP within ordered parameters Outcome: Completed/Met Date Met:  08/15/14 Goal: tPA Patient w/o S&S of bleeding Outcome: Not Applicable Date Met:  31/74/09 Goal: Prognosis discussed with family/patient as appropriate Outcome: Completed/Met Date Met:  08/15/14 Goal: Other Acute Treatment Outcomes Outcome: Not Applicable Date Met:  92/78/00

## 2014-08-15 NOTE — Progress Notes (Signed)
UR completed 

## 2014-08-15 NOTE — Progress Notes (Signed)
NURSING PROGRESS NOTE  [name] [id number] Discharge Data: 08/15/2014 6:47 PM Attending Provider: No att. providers found [id number], Elenore Rota, MD   [name] to be D/C'd Home per MD order.    All IV's discontinued and monitored for bleeding.  All belongings returned to patient for patient to take home.  AVS summary reviewed with patient and spouse.  Patient left floor via wheelchair, escorted by NT.  Last Documented Vital Signs:  Blood pressure 152/84, pulse 72, temperature 97.9 F (36.6 C), temperature source Oral, resp. rate 20, height 5\' 6"  (1.676 m), weight 83.915 kg (185 lb), SpO2 99 %.  [name] D

## 2014-08-15 NOTE — Progress Notes (Signed)
  Echocardiogram 2D Echocardiogram has been performed.  Samuel Germany 08/15/2014, 9:44 AM

## 2014-08-15 NOTE — Progress Notes (Signed)
MD phoned and stated to use the sliding scale for coverage for blood sugar. MD stated to give pt 7 units of Novolog.

## 2014-08-15 NOTE — Discharge Summary (Signed)
Physician Discharge Summary  NEKIA MAXHAM VEH:[id number] DOB: 1951/04/28 DOA: 08/14/2014  PCP: Redge Gainer, MD  Admit date: 08/14/2014 Discharge date: 08/15/2014  Time spent: 45 minutes  Recommendations for Outpatient Follow-up:  -Will be discharged home today. -Advised to follow up with PCP in 2 weeks.   Discharge Diagnoses:  Principal Problem:   TIA (transient ischemic attack) Active Problems:   Hyperlipidemia   CAD, NATIVE VESSEL, cath 01/07/14 non obstructive coronary disease   Essential hypertension, benign   DM type 2 with diabetic dyslipidemia   Discharge Condition: Stable and improved  Filed Weights   08/14/14 1401 08/15/14 0720  Weight: 83.15 kg (183 lb 5 oz) 83.915 kg (185 lb)    History of present illness:  Patient is a 63 y/o woman with h/o morbid obesity, HTN, DM, hyperlipidemia, CAD s/p stenting in 2008 and prior TIA who presents with the above-mentioned. History is obtained by husband Tim at bedside as patient does not recall the events of today. Tim relates she was last normal at 9;15 am; at 9:30 they went to pick up a stew from their church and deliver it to friends and family and he noticed her staggering towards the right when they got out of the car. She later developed slurred speech. He called 911 and she was brought to the ED. Her symptoms started to resolve fairly quickly and by the time she is seen by the EDP the right sided weakness is completely gone and slurred speech was improving. By the time I see her, all symptoms have resolved. Consult with tele-neurology was obtained and because her deficits were improving quickly, it was decided she was not a candidate for TPA and decision was made to admit her at [facility].  Hospital Course:   TIA -Symptoms have completely resolved. -Was seen by tele-neurology with plans to continue full dose ASA and plavix. -Decision was made to not give TPA given rapid improvement in symptoms. -LDL 47. -Ambulating without  issue. -ECHO: Left ventricle: The cavity size was normal. Wall thickness was increased in a pattern of mild LVH. Systolic function was normal. The estimated ejection fraction was in the range of 60% to 65%. Wall motion was normal; there were no regional wall motion abnormalities. Doppler parameters are consistent with abnormal left ventricular relaxation (grade 1 diastolic dysfunction). -Carotid Dopplers: Mild plaque in the proximal right ICA with estimated right ICA stenosis of less than 50%. Moderate plaque in the left common carotid artery, bulb and proximal ICA. Estimated left ICA stenosis is less than 50%. Neither vertebral artery was visualized by Ultrasound.  Rest of chronic medical issues were stable and home medications were not changed.  Procedures:  None   Consultations:  Tele-neurology  Discharge Instructions  Discharge Instructions    Diet - low sodium heart healthy    Complete by:  As directed      Increase activity slowly    Complete by:  As directed             Medication List    STOP taking these medications        esomeprazole 40 MG capsule  Commonly known as:  NEXIUM     EYE VITAMINS PO     predniSONE 20 MG tablet  Commonly known as:  DELTASONE      TAKE these medications        ALPRAZolam 0.5 MG tablet  Commonly known as:  XANAX  TAKE ONE TABLET 3 TIMES A DAY AS NEEDED.  aspirin EC 325 MG tablet  Take 325 mg by mouth daily.     atenolol 50 MG tablet  Commonly known as:  TENORMIN  Take 1 tablet (50 mg total) by mouth 2 (two) times daily.     citalopram 20 MG tablet  Commonly known as:  CELEXA  Take 1 tablet (20 mg total) by mouth daily.     clopidogrel 75 MG tablet  Commonly known as:  PLAVIX  Take 1 tablet (75 mg total) by mouth daily.     ezetimibe 10 MG tablet  Commonly known as:  ZETIA  Take 1 tablet (10 mg total) by mouth daily.     furosemide 40 MG tablet  Commonly known as:  LASIX  Take 1 tablet (40 mg  total) by mouth 2 (two) times daily.     HUMALOG KWIKPEN 100 UNIT/ML KiwkPen  Generic drug:  insulin lispro  Inject 25 Units into the skin 3 (three) times daily.     isosorbide mononitrate 120 MG 24 hr tablet  Commonly known as:  IMDUR  Take 120 mg by mouth once a week.     LEVEMIR FLEXTOUCH 100 UNIT/ML Pen  Generic drug:  Insulin Detemir  Inject 50 Units into the skin every morning.     lisinopril 20 MG tablet  Commonly known as:  PRINIVIL,ZESTRIL  Take 1 tablet (20 mg total) by mouth 2 (two) times daily.     nitroGLYCERIN 0.4 MG SL tablet  Commonly known as:  NITROSTAT  Place 1 tablet (0.4 mg total) under the tongue every 5 (five) minutes as needed. For chest pain     potassium chloride SA 20 MEQ tablet  Commonly known as:  K-DUR,KLOR-CON  Take 1 tablet (20 mEq total) by mouth daily.     rosuvastatin 40 MG tablet  Commonly known as:  CRESTOR  Take 1 tablet (40 mg total) by mouth daily.     vitamin B-12 1000 MCG tablet  Commonly known as:  CYANOCOBALAMIN  Take 1,000 mcg by mouth daily.     zolpidem 10 MG tablet  Commonly known as:  AMBIEN  Take 0.5-1 tablets (5-10 mg total) by mouth at bedtime as needed for sleep.       Allergies  Allergen Reactions  . Iohexol      Desc: pt had syncopal episode with nausea post IV CM late 1990's,  pt has had prednisone prep with heart caths x 2 without problem  kdean 04/16/07, Onset Date: [id number]   . Ticlid [Ticlopidine Hcl] Nausea And Vomiting  . Codeine Nausea And Vomiting and Palpitations       Follow-up Information    Follow up with Redge Gainer, MD. Schedule an appointment as soon as possible for a visit in 2 weeks.   Specialty:  Family Medicine   Contact information:   [place] [phone]        The results of significant diagnostics from this hospitalization (including imaging, microbiology, ancillary and laboratory) are listed below for reference.    Significant Diagnostic  Studies: Ct Head Wo Contrast  08/14/2014   CLINICAL DATA:  Code stroke. Right-sided weakness and slurred speech 1 hr 45 min.  EXAM: CT HEAD WITHOUT CONTRAST  TECHNIQUE: Contiguous axial images were obtained from the base of the skull through the vertex without intravenous contrast.  COMPARISON:  MRI brain 06/26/2010  FINDINGS: Ventricles, cisterns and other CSF spaces are within normal. No evidence of mass, mass effect, shift of midline structures  or acute hemorrhage. No evidence to suggest acute infarction. Remaining bones and soft tissues are unremarkable.  IMPRESSION: No acute intracranial findings.  These results were called by telephone at the time of interpretation on 08/14/2014 at 11:32 am to Dr. Thayer Jew , who verbally acknowledged these results.   Electronically Signed   By: Marin Olp M.D.   On: 08/14/2014 11:32   Mr Angiogram Head Wo Contrast  08/14/2014   CLINICAL DATA:  63 year old female with headache and right side weakness for 1 day. TIA. Initial encounter.  EXAM: MRI HEAD WITHOUT CONTRAST  MRA HEAD WITHOUT CONTRAST  TECHNIQUE: Multiplanar, multiecho pulse sequences of the brain and surrounding structures were obtained without intravenous contrast. Angiographic images of the head were obtained using MRA technique without contrast.  COMPARISON:  Head CT without contrast 08/14/2014 [facility]. Brain MRI and MRA 06/26/2010  FINDINGS: MRI HEAD FINDINGS  Stable cerebral volume since 2011. Major intracranial vascular flow voids are stable. No restricted diffusion to suggest acute infarction. No midline shift, mass effect, evidence of mass lesion, ventriculomegaly, extra-axial collection or acute intracranial hemorrhage. Cervicomedullary junction and pituitary are within normal limits. Negative visualized cervical spine.  Small scattered cerebral white matter T2 and FLAIR hyperintensity was more apparent in 2011, perhaps due to motion artifact today. Configuration is nonspecific.  No new or progressed signal abnormality identified. No cortical encephalomalacia.  Visible internal auditory structures appear normal. New mild mastoid effusion on the left. Stable and negative visualized nasopharynx. Right mastoids remain clear. New scattered paranasal sinus mucosal thickening and opacification, mild except in the left frontal sinus were there is moderate involvement as well as bubbly opacity.  Interval postoperative changes to both globes. Visualized scalp soft tissues are within normal limits. Normal bone marrow signal.  MRA HEAD FINDINGS  Stable antegrade flow in the posterior circulation with mildly dominant distal left vertebral artery. Normal right PICA origin and vertebrobasilar junction. Basilar artery stable and within normal limits. SCA and left PCA origins remain normal. Fetal right PCA origin re - identified. Bilateral PCA branches are stable.  Stable antegrade flow in both ICA siphons. Ophthalmic and right posterior communicating artery origins are normal. Left posterior communicating artery diminutive or absent as before. Carotid termini, MCA and ACA origins are stable and within normal limits. Left A1 segment is dominant. Anterior communicating artery is diminutive or absent. Visualized bilateral ACA branches are stable and within normal limits. Mild artifactual signal loss in the bilateral MCA branches, symmetrically affecting the distal M1 segments. Visualized bilateral MCA branches otherwise are stable and within normal limits.  IMPRESSION: 1.  No acute intracranial abnormality. 2. New mild to moderate paranasal sinus and left mastoid air cell inflammatory changes. 3. Stable and negative intracranial MRA.   Electronically Signed   By: Lars Pinks M.D.   On: 08/14/2014 14:00   Mr Brain Wo Contrast  08/14/2014   CLINICAL DATA:  63 year old female with headache and right side weakness for 1 day. TIA. Initial encounter.  EXAM: MRI HEAD WITHOUT CONTRAST  MRA HEAD WITHOUT CONTRAST   TECHNIQUE: Multiplanar, multiecho pulse sequences of the brain and surrounding structures were obtained without intravenous contrast. Angiographic images of the head were obtained using MRA technique without contrast.  COMPARISON:  Head CT without contrast 08/14/2014 [facility]. Brain MRI and MRA 06/26/2010  FINDINGS: MRI HEAD FINDINGS  Stable cerebral volume since 2011. Major intracranial vascular flow voids are stable. No restricted diffusion to suggest acute infarction. No midline shift, mass effect, evidence of  mass lesion, ventriculomegaly, extra-axial collection or acute intracranial hemorrhage. Cervicomedullary junction and pituitary are within normal limits. Negative visualized cervical spine.  Small scattered cerebral white matter T2 and FLAIR hyperintensity was more apparent in 2011, perhaps due to motion artifact today. Configuration is nonspecific. No new or progressed signal abnormality identified. No cortical encephalomalacia.  Visible internal auditory structures appear normal. New mild mastoid effusion on the left. Stable and negative visualized nasopharynx. Right mastoids remain clear. New scattered paranasal sinus mucosal thickening and opacification, mild except in the left frontal sinus were there is moderate involvement as well as bubbly opacity.  Interval postoperative changes to both globes. Visualized scalp soft tissues are within normal limits. Normal bone marrow signal.  MRA HEAD FINDINGS  Stable antegrade flow in the posterior circulation with mildly dominant distal left vertebral artery. Normal right PICA origin and vertebrobasilar junction. Basilar artery stable and within normal limits. SCA and left PCA origins remain normal. Fetal right PCA origin re - identified. Bilateral PCA branches are stable.  Stable antegrade flow in both ICA siphons. Ophthalmic and right posterior communicating artery origins are normal. Left posterior communicating artery diminutive or absent as  before. Carotid termini, MCA and ACA origins are stable and within normal limits. Left A1 segment is dominant. Anterior communicating artery is diminutive or absent. Visualized bilateral ACA branches are stable and within normal limits. Mild artifactual signal loss in the bilateral MCA branches, symmetrically affecting the distal M1 segments. Visualized bilateral MCA branches otherwise are stable and within normal limits.  IMPRESSION: 1.  No acute intracranial abnormality. 2. New mild to moderate paranasal sinus and left mastoid air cell inflammatory changes. 3. Stable and negative intracranial MRA.   Electronically Signed   By: Lars Pinks M.D.   On: 08/14/2014 14:00   US Carotid Bilateral  08/15/2014   CLINICAL DATA:  TIA and history of hypertension, coronary artery disease, hyperlipidemia and diabetes.  EXAM: BILATERAL CAROTID DUPLEX ULTRASOUND  TECHNIQUE: Pearline Cables scale imaging, color Doppler and duplex ultrasound were performed of bilateral carotid and vertebral arteries in the neck.  COMPARISON:  None  FINDINGS: Criteria: Quantification of carotid stenosis is based on velocity parameters that correlate the residual internal carotid diameter with NASCET-based stenosis levels, using the diameter of the distal internal carotid lumen as the denominator for stenosis measurement.  The following velocity measurements were obtained:  RIGHT  ICA:  113/23 cm/sec  CCA:  98/1 cm/sec  SYSTOLIC ICA/CCA RATIO:  1.8  DIASTOLIC ICA/CCA RATIO:  3.0  ECA:  68 cm/sec  LEFT  ICA:  89/24 cm/sec  CCA:  19/14 cm/sec  SYSTOLIC ICA/CCA RATIO:  1.4  DIASTOLIC ICA/CCA RATIO:  2.3  ECA:  79 cm/sec  RIGHT CAROTID ARTERY: There is a mild amount of partially calcified plaque present in the internal carotid artery. Intimal thickening present in the common carotid artery and carotid bulb. Velocities and waveforms are normal and estimated right ICA stenosis is less than 50%.  RIGHT VERTEBRAL ARTERY: The right vertebral artery was not visualized.   LEFT CAROTID ARTERY: There is a moderate amount of calcified plaque in the common carotid artery, carotid bulb and proximal ICA. Some turbulent flow is present with non elevated velocities corresponding to an estimated less than 50% left ICA stenosis.  LEFT VERTEBRAL ARTERY: Not definitively visualized by ultrasound. There is suggestion of potentially some calcifications near the expected position of the left vertebral artery.  IMPRESSION: Mild plaque in the proximal right ICA with estimated right ICA stenosis of less  than 50%. Moderate plaque in the left common carotid artery, bulb and proximal ICA. Estimated left ICA stenosis is less than 50%. Neither vertebral artery was visualized by ultrasound.   Electronically Signed   By: Aletta Edouard M.D.   On: 08/15/2014 10:01    Microbiology: No results found for this or any previous visit (from the past 240 hour(s)).   Labs: Basic Metabolic Panel:  Recent Labs Lab 08/14/14 1121 08/14/14 1130  NA 135* 138  K 3.3* 3.2*  CL 94* 97  CO2 30  --   GLUCOSE 276* 275*  BUN 10 8  CREATININE 0.82 0.80  CALCIUM 9.2  --    Liver Function Tests:  Recent Labs Lab 08/14/14 1121  AST 31  ALT 26  ALKPHOS 54  BILITOT 0.5  PROT 6.9  ALBUMIN 3.4*   No results for input(s): LIPASE, AMYLASE in the last 168 hours. No results for input(s): AMMONIA in the last 168 hours. CBC:  Recent Labs Lab 08/14/14 1121 08/14/14 1130  WBC 7.2  --   NEUTROABS 4.2  --   HGB 14.0 13.9  HCT 39.6 41.0  MCV 85.2  --   PLT 141*  --    Cardiac Enzymes: No results for input(s): CKTOTAL, CKMB, CKMBINDEX, TROPONINI in the last 168 hours. BNP: BNP (last 3 results) No results for input(s): PROBNP in the last 8760 hours. CBG:  Recent Labs Lab 08/14/14 1648 08/14/14 2130 08/15/14 0734 08/15/14 1154  GLUCAP 151* 335* 220* 254*       Signed:  HERNANDEZ ACOSTA,ESTELA  [facility] Pager: [phone] 08/15/2014, 1:27 PM

## 2014-08-26 ENCOUNTER — Other Ambulatory Visit: Payer: Self-pay | Admitting: Nurse Practitioner

## 2014-08-27 ENCOUNTER — Telehealth: Payer: Self-pay | Admitting: *Deleted

## 2014-08-27 NOTE — Telephone Encounter (Signed)
Ambien called to [facility] per Dr. Laurance Flatten.

## 2014-08-27 NOTE — Telephone Encounter (Signed)
Patient last seen in office on 04-26-14. Rx last filled on 03-19-14 for #90. Please advise. If approved please route to pool A so nurse can phone in to pharmacy

## 2014-08-27 NOTE — Telephone Encounter (Signed)
This is okay to refill 

## 2014-08-30 ENCOUNTER — Ambulatory Visit (INDEPENDENT_AMBULATORY_CARE_PROVIDER_SITE_OTHER): Payer: Medicare HMO | Admitting: Family Medicine

## 2014-08-30 ENCOUNTER — Encounter: Payer: Self-pay | Admitting: Family Medicine

## 2014-08-30 VITALS — BP 156/80 | HR 75 | Temp 97.7°F | Ht 66.0 in | Wt 184.4 lb

## 2014-08-30 DIAGNOSIS — E1165 Type 2 diabetes mellitus with hyperglycemia: Secondary | ICD-10-CM

## 2014-08-30 DIAGNOSIS — E785 Hyperlipidemia, unspecified: Secondary | ICD-10-CM

## 2014-08-30 DIAGNOSIS — G459 Transient cerebral ischemic attack, unspecified: Secondary | ICD-10-CM

## 2014-08-30 DIAGNOSIS — E559 Vitamin D deficiency, unspecified: Secondary | ICD-10-CM

## 2014-08-30 DIAGNOSIS — I10 Essential (primary) hypertension: Secondary | ICD-10-CM

## 2014-08-30 DIAGNOSIS — IMO0002 Reserved for concepts with insufficient information to code with codable children: Secondary | ICD-10-CM

## 2014-08-30 MED ORDER — AMLODIPINE BESYLATE 5 MG PO TABS: 5.0000 mg | ORAL_TABLET | Freq: Every day | ORAL | Status: DC

## 2014-08-30 MED ORDER — ALPRAZOLAM 0.5 MG PO TABS: ORAL_TABLET | ORAL | Status: DC

## 2014-08-30 NOTE — Progress Notes (Signed)
Subjective:    Patient ID: [name], female    DOB: [birth year], 63 y.o.   MRN: [id number]  HPI  Pt is here today for follow up of chronic medical problems which include depression, hypetension, hyperlipidemia, CAD and diabetes.              Review of Systems  Constitutional: Positive for fatigue.  Respiratory: Positive for shortness of breath.   Cardiovascular: Positive for leg swelling (bilateral).  Genitourinary: Negative.   Musculoskeletal: Positive for myalgias (bilateral legs L>R).  Skin: Negative.   Neurological: Positive for headaches.   Patient Active Problem List   Diagnosis Date Noted  . TIA (transient ischemic attack) 08/14/2014  . DM type 2 with diabetic dyslipidemia 08/14/2014  . Cataract 02/08/2014  . Macular degeneration 02/08/2014  . Elevated d-dimer, neg VQ scan 01/08/2014  . Unstable angina 01/07/2014  . Chest pain 12/30/2013  . Generalized anxiety disorder 09/14/2013  . Claudication 06/18/2013  . Diverticulosis of colon 01/01/2012  . Left lower quadrant pain 01/01/2012  . GERD (gastroesophageal reflux disease) 01/01/2012  . Constipation 01/01/2012  . Hypokalemia 08/24/2011  . Diabetes type 2, uncontrolled- on insulin pump 08/23/2011  . Essential hypertension, benign 08/23/2011  . PALPITATIONS 02/10/2010  . Hyperlipidemia 05/10/2009  . DEPRESSION 05/10/2009  . CAD, NATIVE VESSEL, cath 01/07/14 non obstructive coronary disease 08/26/2008  . 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 ALPRAZolam (XANAX) 0.5 MG tablet TAKE ONE TABLET 3 TIMES A DAY AS NEEDED.  Marland Kitchen aspirin EC 325 MG tablet Take 325 mg by mouth daily.  Marland Kitchen atenolol (TENORMIN) 50 MG tablet Take 1 tablet (50 mg total) by mouth 2 (two) times daily.  . citalopram (CELEXA) 20 MG tablet Take 1 tablet (20 mg total) by mouth daily.  . clopidogrel (PLAVIX) 75 MG tablet Take 1 tablet (75 mg total) by mouth daily.  Marland Kitchen ezetimibe  (ZETIA) 10 MG tablet Take 1 tablet (10 mg total) by mouth daily.  . furosemide (LASIX) 40 MG tablet Take 1 tablet (40 mg total) by mouth 2 (two) times daily.  . Insulin Detemir (LEVEMIR FLEXTOUCH) 100 UNIT/ML Pen Inject 50 Units into the skin every morning.  . insulin lispro (HUMALOG KWIKPEN) 100 UNIT/ML KiwkPen Inject 25 Units into the skin 3 (three) times daily.  . isosorbide mononitrate (IMDUR) 120 MG 24 hr tablet Take 120 mg by mouth once a week.  Marland Kitchen lisinopril (PRINIVIL,ZESTRIL) 20 MG tablet Take 1 tablet (20 mg total) by mouth 2 (two) times daily. (Patient not taking: Reported on 08/14/2014)  . nitroGLYCERIN (NITROSTAT) 0.4 MG SL tablet Place 1 tablet (0.4 mg total) under the tongue every 5 (five) minutes as needed. For chest pain  . potassium chloride SA (K-DUR,KLOR-CON) 20 MEQ tablet Take 1 tablet (20 mEq total) by mouth daily.  . rosuvastatin (CRESTOR) 40 MG tablet Take 1 tablet (40 mg total) by mouth daily.  . vitamin B-12 (CYANOCOBALAMIN) 1000 MCG tablet Take 1,000 mcg by mouth daily.  Marland Kitchen zolpidem (AMBIEN) 10 MG tablet TAKE 1/2 TO 1 TABLET AT BEDTIME AS NEEDED              Objective:   Physical Exam  Constitutional: She is oriented to person, place, and time. She appears well-developed and well-nourished. No distress.  HENT:  Head: Normocephalic and atraumatic.  Right Ear: External ear normal.  Left Ear: External ear normal.  Nose: Nose normal.  Mouth/Throat: Oropharynx is clear  and moist. No oropharyngeal exudate.  Eyes: Conjunctivae and EOM are normal. Pupils are equal, round, and reactive to light. Right eye exhibits no discharge. Left eye exhibits no discharge. No scleral icterus.  Neck: Normal range of motion. Neck supple. No thyromegaly present.  No carotid bruits though she is had carotid Dopplers with less than 50% stenosis  Cardiovascular: Normal rate, regular rhythm, normal heart sounds and intact distal pulses.   No murmur heard. The heart has a regular rate and  rhythm at 84/m  Pulmonary/Chest: Effort normal and breath sounds normal. No respiratory distress. She has no wheezes. She has no rales. She exhibits no tenderness.  Abdominal: Soft. Bowel sounds are normal. She exhibits no mass. There is tenderness. There is no rebound and no guarding.  Obesity with with only slight epigastric  Musculoskeletal: Normal range of motion. She exhibits no edema or tenderness.  Lymphadenopathy:    She has no cervical adenopathy.  Neurological: She is alert and oriented to person, place, and time. She has normal reflexes. No cranial nerve deficit.  Skin: Skin is warm and dry. No rash noted.  Psychiatric: She has a normal mood and affect. Her behavior is normal. Judgment and thought content normal.  Nursing note and vitals reviewed.  BP 156/80 mmHg  Pulse 75  Temp(Src) 97.7 F (36.5 C) (Oral)  Ht 5\' 6"  (1.676 m)  Wt 184 lb 6.4 oz (83.643 kg)  BMI 29.78 kg/m2        Assessment & Plan:   1. Diabetes type 2, uncontrolled- on insulin pump  2. Essential hypertension, benign  3. Vitamin D deficiency  4. Hyperlipemia  5. Transient cerebral ischemia, unspecified transient cerebral ischemia type  Meds ordered this encounter  Medications  . ALPRAZolam (XANAX) 0.5 MG tablet    Sig: TAKE ONE TABLET 3 TIMES A DAY AS NEEDED.    Dispense:  90 tablet    Refill:  2  . amLODipine (NORVASC) 5 MG tablet    Sig: Take 5 mg by mouth daily. 1/2 tablet daily     Patient Instructions  Continue current medications. Continue good therapeutic lifestyle changes which include good diet and exercise. Fall precautions discussed with patient. If an FOBT was given today- please return it to our front desk. If you are over 54 years old - you may need Prevnar 1 or the adult Pneumonia vaccine.  Flu Shots will be available at our office starting mid- September. Please call and schedule a FLU CLINIC APPOINTMENT.   We will arrange for you to have an appointment with the  clinical pharmacists to follow-up on your blood sugars and blood pressure We will also schedule you for a visit with the neurologist to follow-up on this TIA to make sure that we are doing everything we need to do to help prevent this Continue to check your feet regularly, continue to monitor your blood pressure regularly, and check her blood sugars regularly Exercise as much as possible and drink as much water as possible Watch sodium intake   Arrie Senate MD

## 2014-08-30 NOTE — Patient Instructions (Addendum)
Continue current medications. Continue good therapeutic lifestyle changes which include good diet and exercise. Fall precautions discussed with patient. If an FOBT was given today- please return it to our front desk. If you are over 65 years old - you may need Prevnar 57 or the adult Pneumonia vaccine.  Flu Shots will be available at our office starting mid- September. Please call and schedule a FLU CLINIC APPOINTMENT.   We will arrange for you to have an appointment with the clinical pharmacists to follow-up on your blood sugars and blood pressure We will also schedule you for a visit with the neurologist to follow-up on this TIA to make sure that we are doing everything we need to do to help prevent this Continue to check your feet regularly, continue to monitor your blood pressure regularly, and check her blood sugars regularly Exercise as much as possible and drink as much water as possible Watch sodium intake

## 2014-08-31 ENCOUNTER — Telehealth: Payer: Self-pay | Admitting: *Deleted

## 2014-08-31 DIAGNOSIS — E876 Hypokalemia: Secondary | ICD-10-CM

## 2014-08-31 LAB — BMP8+EGFR
BUN/Creatinine Ratio: 11 (ref 11–26)
BUN: 9 mg/dL (ref 8–27)
CO2: 31 mmol/L — ABNORMAL HIGH (ref 18–29)
Calcium: 9.2 mg/dL (ref 8.7–10.3)
Chloride: 94 mmol/L — ABNORMAL LOW (ref 97–108)
Creatinine, Ser: 0.84 mg/dL (ref 0.57–1.00)
GFR, EST AFRICAN AMERICAN: 84 mL/min/{1.73_m2} (ref >59)
GFR, EST NON AFRICAN AMERICAN: 73 mL/min/{1.73_m2} (ref >59)
Glucose: 257 mg/dL — ABNORMAL HIGH (ref 65–99)
POTASSIUM: 3.3 mmol/L — AB (ref 3.5–5.2)
Sodium: 139 mmol/L (ref 134–144)

## 2014-08-31 LAB — HEPATIC FUNCTION PANEL
ALT: 27 IU/L (ref 0–32)
AST: 38 IU/L (ref 0–40)
Albumin: 4 g/dL (ref 3.6–4.8)
Alkaline Phosphatase: 50 IU/L (ref 39–117)
BILIRUBIN TOTAL: 0.4 mg/dL (ref 0.0–1.2)
Bilirubin, Direct: 0.14 mg/dL (ref 0.00–0.40)
Total Protein: 6.5 g/dL (ref 6.0–8.5)

## 2014-08-31 NOTE — Telephone Encounter (Signed)
Please, please take potassium regularly

## 2014-08-31 NOTE — Telephone Encounter (Signed)
FYI,        Patient has not been taking her potassium pill as directed.  She did not like the size of the pill.  She will break it in half and take it regularly.  She was told to return for potassium recheck in 2 weeks.   She will schedule with our pharmacist in a few weeks to discuss blood sugar.

## 2014-09-02 ENCOUNTER — Encounter (HOSPITAL_COMMUNITY): Payer: Self-pay | Admitting: Cardiology

## 2014-09-06 ENCOUNTER — Ambulatory Visit (INDEPENDENT_AMBULATORY_CARE_PROVIDER_SITE_OTHER): Payer: Medicare HMO | Admitting: Pharmacist

## 2014-09-06 ENCOUNTER — Encounter: Payer: Self-pay | Admitting: Pharmacist

## 2014-09-06 VITALS — BP 140/80 | HR 74 | Ht 66.0 in | Wt 186.0 lb

## 2014-09-06 DIAGNOSIS — Z1212 Encounter for screening for malignant neoplasm of rectum: Secondary | ICD-10-CM

## 2014-09-06 DIAGNOSIS — IMO0002 Reserved for concepts with insufficient information to code with codable children: Secondary | ICD-10-CM

## 2014-09-06 DIAGNOSIS — I10 Essential (primary) hypertension: Secondary | ICD-10-CM

## 2014-09-06 DIAGNOSIS — E785 Hyperlipidemia, unspecified: Secondary | ICD-10-CM

## 2014-09-06 DIAGNOSIS — Z794 Long term (current) use of insulin: Principal | ICD-10-CM

## 2014-09-06 DIAGNOSIS — E876 Hypokalemia: Secondary | ICD-10-CM

## 2014-09-06 DIAGNOSIS — E1165 Type 2 diabetes mellitus with hyperglycemia: Secondary | ICD-10-CM

## 2014-09-06 MED ORDER — INSULIN DETEMIR 100 UNIT/ML FLEXPEN: 35.0000 [IU] | PEN_INJECTOR | Freq: Two times a day (BID) | SUBCUTANEOUS | Status: DC

## 2014-09-06 NOTE — Progress Notes (Signed)
Diabetes Follow-Up Visit Chief Complaint:   Chief Complaint  Patient presents with  . Diabetes     Filed Vitals:   09/06/14 0925  BP: 140/80  Pulse: 74   Filed Weights   09/06/14 0925  Weight: 186 lb (84.369 kg)    HPI: Patient is here today for uncontrolled type 2 diabetes.  Patient was recently hospitalized in November 2015 for TIA. Patient had an insulin pump for about 1 year but in July of 2015 her insurance stopped paying for pump supplies and patient was switched to Levemir 50 units daily and Novolog 15 to 20 units prior to meals (will start with 15 units and increase as needed) She has not been following up regularly for type 2 diabetes.    Patient has tried metformin regular and XR and very low dose but has not been able to tolerate in the past due to diarrhea and nausea. We also tried Invokana 300mg  which she states brought BG down well at first but then BG increased.  She stopped Invokana several months ago due to concerns with side effects and she felt was not helping much.   Patient is very concern about being able to afford her medications. I was able to review her drug formulary for 2016 and noted that she has 4 medications that are tier 3 - levemir, humalog, crestor and zetia.  Only crestor has alternative which would be in a lower tier but she is at top dose of cresator - 40mg  daily and atorvastatin 80mg  would probably not lower lipids to goal.  Home BG Monitoring:  Checking 1 time a day. Average:  273 High: over 400   Low:  174   Patient has keto sticks at home and tests when BG greater than 400.  She has not had any positive urine ketone readings.  Exam Edema:  negative  Polyuria:  Negative today but she does report increased frequency at time Polydipsia:  Negative             Polyphagia:  negative  BMI:  Body mass index is 30.04 kg/(m^2).   Weight changes:  stable General Appearance:  alert, oriented, no acute distress and obese Mood/Affect:  normal   Low  fat/carbohydrate diet?  Yes - continues to limit CHO portions sizes  Nicotine Abuse?  No Medication Compliance?  Yes Exercise?  No Alcohol Abuse?  No   Lab Results  Component Value Date   HGBA1C 9.7* 08/14/2014    Lipids panel from 08/14/2014 LDL = 47 Tg = 156 Total choletserol = 111 HDL = 33  Potassium was 3.3 (08/30/2014)  Assessment: 1.  Diabetes.  Uncontrolled  2.  Blood Pressure.  Good 3.  Dyslipidemia - LDL at goal of less than 70.  Triglyderides slightly elevated at 156 (should improve with better BG control) and HDL low. 4.  Hypokalemia  Recommendations: 1.  Medication recommendations at this time are as follows:    Increase Levemir to 35 units BID  Continue Humalog 25 units tid prior to meal and 10 units as needed for BG greater than 300.  Patient to call if gets more than 1 BG reading greater than 300 for insulin adjustment.   2.  Continue  Crestor 40mg  1 tablet daily (given coupon for #30 free crestor)  Continue Zetia 10mg  1 tablet daily.   3.  Patient given information on calling her insurance to find out if she qualifies for low income subsidy.  I also gave her information on applying  for assistance for AZ&Me (crestor).  She was also given local SHIP information in case she was unable to apply for LIS through her insurance.   4.  Revewed HBG goals:  Fasting 80-130 and 1-2 hour post prandial <180.  Patient is instructed to check BG 3 to 5  times per day.   5.  Patient reminded to make appt to have eyes checked - she states she received notice from [facility] reminding her of appt.  6.  BP goal < 140/80. - continue with current meds for BP 7.  Return to clinic in 4 weeks    Time spent counseling patient:  60 minutes   Cherre Robins, PharmD, CPP, CDE

## 2014-09-06 NOTE — Patient Instructions (Signed)
Increase Levemir to 35 units TWICE A DAY Continue Humalog 25 units three times a day prior to meals and 10 units as needed for blood glucose greater than 300.   Call office if you get  more than 1 blood glucose reading greater than 300 for insulin adjustment.  Diabetes and Standards of Medical Care   Diabetes is complicated. You may find that your diabetes team includes a dietitian, nurse, diabetes educator, eye doctor, and more. To help everyone know what is going on and to help you get the care you deserve, the following schedule of care was developed to help keep you on track. Below are the tests, exams, vaccines, medicines, education, and plans you will need.  Blood Glucose Goals Prior to meals = 80 - 130 Within 2 hours of the start of a meal = less than 180  HbA1c test (goal is less than 7.0% - your last value was %) This test shows how well you have controlled your glucose over the past 2 to 3 months. It is used to see if your diabetes management plan needs to be adjusted.   It is performed at least 2 times a year if you are meeting treatment goals.  It is performed 4 times a year if therapy has changed or if you are not meeting treatment goals.  Blood pressure test  This test is performed at every routine medical visit. The goal is less than 140/90 mmHg for most people, but 130/80 mmHg in some cases. Ask your health care provider about your goal.  Dental exam  Follow up with the dentist regularly.  Eye exam  If you are diagnosed with type 1 diabetes as a child, get an exam upon reaching the age of 1 years or older and have had diabetes for 3 to 5 years. Yearly eye exams are recommended after that initial eye exam.  If you are diagnosed with type 1 diabetes as an adult, get an exam within 5 years of diagnosis and then yearly.  If you are diagnosed with type 2 diabetes, get an exam as soon as possible after the diagnosis and then yearly.  Foot care exam  Visual foot exams  are performed at every routine medical visit. The exams check for cuts, injuries, or other problems with the feet.  A comprehensive foot exam should be done yearly. This includes visual inspection as well as assessing foot pulses and testing for loss of sensation.  Check your feet nightly for cuts, injuries, or other problems with your feet. Tell your health care provider if anything is not healing.  Kidney function test (urine microalbumin)  This test is performed once a year.  Type 1 diabetes: The first test is performed 5 years after diagnosis.  Type 2 diabetes: The first test is performed at the time of diagnosis.  A serum creatinine and estimated glomerular filtration rate (eGFR) test is done once a year to assess the level of chronic kidney disease (CKD), if present.  Lipid profile (cholesterol, HDL, LDL, triglycerides)  Performed every 5 years for most people.  The goal for LDL is less than 100 mg/dL. If you are at high risk, the goal is less than 70 mg/dL.  The goal for HDL is 40 mg/dL to 50 mg/dL for men and 50 mg/dL to 60 mg/dL for women. An HDL cholesterol of 60 mg/dL or higher gives some protection against heart disease.  The goal for triglycerides is less than 150 mg/dL.  Influenza vaccine, pneumococcal vaccine,  and hepatitis B vaccine  The influenza vaccine is recommended yearly.  The pneumococcal vaccine is generally given once in a lifetime. However, there are some instances when another vaccination is recommended. Check with your health care provider.  The hepatitis B vaccine is also recommended for adults with diabetes.  Diabetes self-management education  Education is recommended at diagnosis and ongoing as needed.  Treatment plan  Your treatment plan is reviewed at every medical visit.  Document Released: 07/08/2009 Document Revised: 05/13/2013 Document Reviewed: 02/10/2013 [facility] Patient Information 2014 [place].

## 2014-09-07 LAB — POTASSIUM: Potassium: 4.2 mmol/L (ref 3.5–5.2)

## 2014-09-27 ENCOUNTER — Telehealth: Payer: Self-pay | Admitting: *Deleted

## 2014-09-27 ENCOUNTER — Other Ambulatory Visit: Payer: Self-pay | Admitting: Nurse Practitioner

## 2014-09-27 ENCOUNTER — Telehealth: Payer: Self-pay | Admitting: Family Medicine

## 2014-09-27 MED ORDER — INSULIN LISPRO 100 UNIT/ML (KWIKPEN): PEN_INJECTOR | SUBCUTANEOUS | Status: DC

## 2014-09-27 MED ORDER — INSULIN DETEMIR 100 UNIT/ML FLEXPEN: 60.0000 [IU] | PEN_INJECTOR | Freq: Every day | SUBCUTANEOUS | Status: DC

## 2014-09-27 MED ORDER — INSULIN DETEMIR 100 UNIT/ML FLEXPEN: 35.0000 [IU] | PEN_INJECTOR | Freq: Two times a day (BID) | SUBCUTANEOUS | Status: DC

## 2014-09-27 NOTE — Telephone Encounter (Signed)
rx orders changed and sent [facility] - hopefully with NEW order - ins will pay  We do not have samples of either one of her meds.   Pt aware   Pt states she sometimes needs more than 25 units humalog TID -( so order written for up to 30 TID)

## 2014-09-27 NOTE — Telephone Encounter (Signed)
Aware, new insulin rx sent in.

## 2014-10-08 ENCOUNTER — Telehealth: Payer: Self-pay | Admitting: Neurology

## 2014-10-08 NOTE — Telephone Encounter (Signed)
Pt left message with the answering service to cancela ppt for 10-11-14

## 2014-10-11 ENCOUNTER — Telehealth: Payer: Self-pay | Admitting: Pharmacist

## 2014-10-11 ENCOUNTER — Telehealth: Payer: Self-pay | Admitting: Family Medicine

## 2014-10-11 ENCOUNTER — Other Ambulatory Visit (INDEPENDENT_AMBULATORY_CARE_PROVIDER_SITE_OTHER): Payer: Medicare HMO

## 2014-10-11 ENCOUNTER — Other Ambulatory Visit: Payer: Self-pay | Admitting: Pharmacist

## 2014-10-11 ENCOUNTER — Ambulatory Visit: Payer: Medicare HMO | Admitting: Neurology

## 2014-10-11 ENCOUNTER — Ambulatory Visit: Payer: Medicare HMO

## 2014-10-11 DIAGNOSIS — R7309 Other abnormal glucose: Secondary | ICD-10-CM

## 2014-10-11 LAB — POCT URINALYSIS DIPSTICK
BILIRUBIN UA: NEGATIVE
Glucose, UA: NEGATIVE
Ketones, UA: NEGATIVE
Leukocytes, UA: NEGATIVE
NITRITE UA: NEGATIVE
Spec Grav, UA: 1.02
UROBILINOGEN UA: NEGATIVE
pH, UA: 6

## 2014-10-11 LAB — GLUCOSE, POCT (MANUAL RESULT ENTRY): POC Glucose: 183 mg/dl — AB (ref 70–99)

## 2014-10-11 MED ORDER — CANAGLIFLOZIN 100 MG PO TABS: 100.0000 mg | ORAL_TABLET | Freq: Every day | ORAL | Status: DC

## 2014-10-11 NOTE — Telephone Encounter (Signed)
Patient is not taking Levemir correctly - she is only takig 60 units daily.   I recommended she change to what we discussed at last appt - 35 units BID.  Because BG is so elevated I need patient to come in to have urine checked for ketone before we can restart Invokana.   If urine is negative for ketones when will restart at [facility] 100mg  once daily.  Patient already has appt for follow up 10/14/2014.

## 2014-10-11 NOTE — Telephone Encounter (Signed)
Patient states that Rx for Levemir and Humalog that she had filled at beginning of January are for old directions. She is worried about running out too soon.  I called [facility] and the pharmacy technician said that Rx's with the old directions were filled and Rx for new directions was profiled.  The tech is going to see if she can change and rerun Rx and will settle up with patient if more insulin is owed to her.  Patient notified and told that if she did not hear anything from [facility] by 4pm to call them.

## 2014-10-11 NOTE — Telephone Encounter (Signed)
Patient called to let me know that there were 2 rx's sent in on 09/27/14 for Levemir.  [facility] filled the Levemir with directions of 60 units qd instead of the Rx for 35 units BID.  Rx has been updated and today Rx for Levemir 35 units BID was filled.

## 2014-10-14 ENCOUNTER — Ambulatory Visit: Payer: Self-pay

## 2014-10-19 ENCOUNTER — Telehealth: Payer: Self-pay | Admitting: Neurology

## 2014-10-19 NOTE — Telephone Encounter (Signed)
Pt canceled appt and did not resch

## 2014-10-19 NOTE — Telephone Encounter (Signed)
Cancellation documented within [facility] referral and referral marked as pt refused to serve as notification to referring provider's office / Sherri S.

## 2014-10-21 ENCOUNTER — Encounter: Payer: Self-pay | Admitting: Pharmacist

## 2014-10-21 ENCOUNTER — Ambulatory Visit (INDEPENDENT_AMBULATORY_CARE_PROVIDER_SITE_OTHER): Payer: Medicare HMO | Admitting: Pharmacist

## 2014-10-21 VITALS — BP 128/80 | HR 72 | Ht 66.0 in | Wt 183.0 lb

## 2014-10-21 DIAGNOSIS — IMO0002 Reserved for concepts with insufficient information to code with codable children: Secondary | ICD-10-CM

## 2014-10-21 DIAGNOSIS — Z794 Long term (current) use of insulin: Principal | ICD-10-CM

## 2014-10-21 DIAGNOSIS — E1169 Type 2 diabetes mellitus with other specified complication: Secondary | ICD-10-CM

## 2014-10-21 DIAGNOSIS — E876 Hypokalemia: Secondary | ICD-10-CM

## 2014-10-21 DIAGNOSIS — E785 Hyperlipidemia, unspecified: Secondary | ICD-10-CM

## 2014-10-21 DIAGNOSIS — E669 Obesity, unspecified: Secondary | ICD-10-CM

## 2014-10-21 DIAGNOSIS — E1165 Type 2 diabetes mellitus with hyperglycemia: Secondary | ICD-10-CM

## 2014-10-21 MED ORDER — CANAGLIFLOZIN 100 MG PO TABS: 100.0000 mg | ORAL_TABLET | Freq: Every day | ORAL | Status: DC

## 2014-10-21 MED ORDER — INSULIN DETEMIR 100 UNIT/ML FLEXPEN: 35.0000 [IU] | PEN_INJECTOR | Freq: Two times a day (BID) | SUBCUTANEOUS | Status: DC

## 2014-10-21 NOTE — Progress Notes (Signed)
Diabetes Follow-Up Visit Chief Complaint:   No chief complaint on file.    Filed Vitals:   10/21/14 0806  BP: 128/80  Pulse: 72   Filed Weights   10/21/14 0806  Weight: 183 lb (83.008 kg)    HPI: Patient is here today to follow up uncontrolled type 2 diabetes.  Patient was last seen about 1 month ago when Invokana 100mg  daily was restarted.  Patient had an insulin pump for about 1 year but in July of 2015 her insurance stopped paying for pump supplies and patient was switched insulin pens.  She is also currently using Levemir 35 units twice daily and Humalog 30 to 35 units prior to meals to control BG.  Patient has tried metformin regular and XR and very low dose but has not been able to tolerate in the past due to diarrhea and nausea.   Home BG Monitoring:  Checking up to 3 times a day. Patient did not bring in glucometer today but reported HBG readings per below Average:  250 High: 344  Low:  174   Patient has keto sticks at home and tests when BG greater than 400.  She has not had any positive urine ketone readings.  Exam Edema:  negative  Polyuria:  Negative Polydipsia:  Negative             Polyphagia:  negative  BMI:  Body mass index is 29.55 kg/(m^2).    Weight changes:  Decrease of 3# over the last 30 days General Appearance:  alert, oriented, no acute distress and obese Mood/Affect:  normal   Low fat/carbohydrate diet?  Yes - continues to limit CHO portions sizes  Nicotine Abuse?  No Medication Compliance?  Yes Exercise?  No Alcohol Abuse?  No   Lab Results  Component Value Date   HGBA1C 9.7* 08/14/2014    Lipids panel from 08/14/2014 LDL = 47 Tg = 156 Total choletserol = 111 HDL = 33  Potassium was 3.3 (08/30/2014).  Rechecked 09/06/14 and was WNL  Assessment: 1.  Diabetes.  Uncontrolled but improving per HBG 2.  Blood Pressure.  Good 3.  Dyslipidemia - LDL at goal of less than 70.  Triglyderides slightly elevated at 156 (should improve with  better BG control) and HDL low.  4.  Hypokalemia - resolved but will recheck since starting Invoakana 5.  obesity   Recommendations: 1.  Medication recommendations at this time are as follows:    Continue Levemir to 35 units BID  Continue Humalog 30 units tid prior to meal and 10 units as needed for BG greater than 300.  Patient to call if gets more than 1 BG reading greater than 300 for insulin adjustment.  Continue Invokana 100mg  daily - check BMET today.  If WNL will consider increase Invokana to 200 or 300mg  daily  2.  Continue  Crestor 40mg  1 tablet daily (given coupon for #30 free crestor)  Continue Zetia 10mg  1 tablet daily.   3.  Revewed HBG goals:  Fasting 80-130 and 1-2 hour post prandial <180.  Patient is instructed to check BG 3 to 5  times per day.   5.  BP goal < 140/80. - since BP at goal and patient has not taken amlodipine in last 2 weeks will discontinue from her medication list.  Patient to continue to check BP at home QD ot QOD 6.  Reviewed low CHO diet and recommended increased physical activity.  7.  Return to clinic in 4 weeks- will recheck  A1c and make medication adjustments and provide diabetes education as needed    Time spent counseling patient:  30 minutes   Cherre Robins, PharmD, CPP, CDE

## 2014-10-22 LAB — BMP8+EGFR
BUN/Creatinine Ratio: 13 (ref 11–26)
BUN: 12 mg/dL (ref 8–27)
CHLORIDE: 99 mmol/L (ref 97–108)
CO2: 26 mmol/L (ref 18–29)
CREATININE: 0.9 mg/dL (ref 0.57–1.00)
Calcium: 9.6 mg/dL (ref 8.7–10.3)
GFR calc Af Amer: 78 mL/min/{1.73_m2} (ref >59)
GFR calc non Af Amer: 67 mL/min/{1.73_m2} (ref >59)
Glucose: 208 mg/dL — ABNORMAL HIGH (ref 65–99)
POTASSIUM: 3.3 mmol/L — AB (ref 3.5–5.2)
Sodium: 142 mmol/L (ref 134–144)

## 2014-10-25 ENCOUNTER — Other Ambulatory Visit: Payer: Self-pay | Admitting: Pharmacist

## 2014-10-25 DIAGNOSIS — E876 Hypokalemia: Secondary | ICD-10-CM

## 2014-10-25 MED ORDER — POTASSIUM CHLORIDE CRYS ER 20 MEQ PO TBCR: 20.0000 meq | EXTENDED_RELEASE_TABLET | Freq: Two times a day (BID) | ORAL | Status: DC

## 2014-10-27 LAB — FECAL OCCULT BLOOD, IMMUNOCHEMICAL: Fecal Occult Bld: NEGATIVE

## 2014-10-27 NOTE — Addendum Note (Signed)
Addended by: Wyline Mood on: 10/27/2014 09:23 AM   Modules accepted: Orders

## 2014-10-28 ENCOUNTER — Other Ambulatory Visit (INDEPENDENT_AMBULATORY_CARE_PROVIDER_SITE_OTHER): Payer: Medicare HMO

## 2014-10-28 DIAGNOSIS — E876 Hypokalemia: Secondary | ICD-10-CM

## 2014-10-28 NOTE — Progress Notes (Signed)
Lab only 

## 2014-10-29 ENCOUNTER — Telehealth: Payer: Self-pay | Admitting: Pharmacist

## 2014-10-29 LAB — BMP8+EGFR
BUN/Creatinine Ratio: 14 (ref 11–26)
BUN: 13 mg/dL (ref 8–27)
CO2: 24 mmol/L (ref 18–29)
Calcium: 9.2 mg/dL (ref 8.7–10.3)
Chloride: 97 mmol/L (ref 97–108)
Creatinine, Ser: 0.96 mg/dL (ref 0.57–1.00)
GFR, EST AFRICAN AMERICAN: 72 mL/min/{1.73_m2} (ref >59)
GFR, EST NON AFRICAN AMERICAN: 62 mL/min/{1.73_m2} (ref >59)
Glucose: 376 mg/dL — ABNORMAL HIGH (ref 65–99)
POTASSIUM: 4 mmol/L (ref 3.5–5.2)
Sodium: 138 mmol/L (ref 134–144)

## 2014-10-29 NOTE — Telephone Encounter (Signed)
Potassium is normal - continue potassium bid  BG was elevated - recently adjusted meds.  Patient to continue to check regularly both before and after meal.  Instructed to call office if BG is greater than 250. Patient aware of resutls

## 2014-11-01 ENCOUNTER — Telehealth: Payer: Self-pay | Admitting: Family Medicine

## 2014-11-01 NOTE — Telephone Encounter (Signed)
Patient's BG has been elevated the last few days.  She reports that she had not changed her dietary intake.  Her BG was 249 this morning and she took her usual 30 units of Levemir (although our records show she is suppose to inject Levemir 35 untis BID) this am and also gave 35 units of Humalog.  I asked her to check BG while on the phone with her and BG 2 hours after dosing with Humalog was 155.  I recommended patient increase Levemir to 32 units BID for next 2 days and then if BG is still over 150 fasting to increase to 35 units BID.

## 2014-11-03 ENCOUNTER — Other Ambulatory Visit: Payer: Self-pay | Admitting: Family Medicine

## 2014-11-04 ENCOUNTER — Telehealth: Payer: Self-pay | Admitting: Family Medicine

## 2014-11-04 ENCOUNTER — Other Ambulatory Visit: Payer: Self-pay | Admitting: Family Medicine

## 2014-11-04 NOTE — Telephone Encounter (Signed)
Last seen 08/30/14 DWM  If approved route to nurse to call into [facility]

## 2014-11-04 NOTE — Telephone Encounter (Signed)
Called in earlier today

## 2014-11-25 ENCOUNTER — Telehealth: Payer: Self-pay | Admitting: Family Medicine

## 2014-11-25 ENCOUNTER — Ambulatory Visit: Payer: Medicare HMO

## 2014-11-25 MED ORDER — INSULIN DETEMIR 100 UNIT/ML FLEXPEN: 35.0000 [IU] | PEN_INJECTOR | Freq: Two times a day (BID) | SUBCUTANEOUS | Status: DC

## 2014-11-25 NOTE — Telephone Encounter (Signed)
2 Levemir pens provided.  Patient spoke with insurance company and she is in the donut hole. They won't cover her Humalog insulin either when it is time to refill it.  Asked her to call Denyse Amass with the [facility] Patient Assistance program. She is familiar with this program.  Will also forward note to [place], Pharm-D to see if she has any suggestions for more affordable insulin.

## 2014-11-26 NOTE — Telephone Encounter (Signed)
Spoke with patient.  At this point the best next step would be to see if she would qualify to receive medications through [facility] Patient Assistance program.  Has contacted Denyse Amass and process has been started.

## 2014-12-01 ENCOUNTER — Telehealth: Payer: Self-pay | Admitting: Family Medicine

## 2014-12-01 MED ORDER — INSULIN DETEMIR 100 UNIT/ML FLEXPEN: 35.0000 [IU] | PEN_INJECTOR | Freq: Two times a day (BID) | SUBCUTANEOUS | Status: DC

## 2014-12-01 MED ORDER — CANAGLIFLOZIN 300 MG PO TABS
300.0000 mg | ORAL_TABLET | Freq: Every day | ORAL | Status: DC
Start: 2014-12-01 — End: 2015-03-17

## 2014-12-01 NOTE — Telephone Encounter (Signed)
Tammy , Please call Daleena at [phone]

## 2014-12-01 NOTE — Addendum Note (Signed)
Addended by: Cherre Robins on: 12/01/2014 04:41 PM   Modules accepted: Orders, Medications

## 2014-12-30 ENCOUNTER — Telehealth: Payer: Self-pay | Admitting: Family Medicine

## 2014-12-30 NOTE — Telephone Encounter (Signed)
The patient's alprazolam can be refill 2

## 2015-01-04 ENCOUNTER — Ambulatory Visit (INDEPENDENT_AMBULATORY_CARE_PROVIDER_SITE_OTHER): Payer: Commercial Managed Care - HMO | Admitting: Family Medicine

## 2015-01-04 ENCOUNTER — Encounter: Payer: Self-pay | Admitting: Family Medicine

## 2015-01-04 ENCOUNTER — Telehealth: Payer: Self-pay | Admitting: Family Medicine

## 2015-01-04 VITALS — BP 114/75 | HR 77 | Temp 97.3°F | Ht 66.0 in | Wt 178.0 lb

## 2015-01-04 DIAGNOSIS — I509 Heart failure, unspecified: Secondary | ICD-10-CM

## 2015-01-04 DIAGNOSIS — E785 Hyperlipidemia, unspecified: Secondary | ICD-10-CM

## 2015-01-04 DIAGNOSIS — E559 Vitamin D deficiency, unspecified: Secondary | ICD-10-CM | POA: Diagnosis not present

## 2015-01-04 DIAGNOSIS — E1169 Type 2 diabetes mellitus with other specified complication: Secondary | ICD-10-CM

## 2015-01-04 DIAGNOSIS — I10 Essential (primary) hypertension: Secondary | ICD-10-CM

## 2015-01-04 DIAGNOSIS — E1165 Type 2 diabetes mellitus with hyperglycemia: Secondary | ICD-10-CM | POA: Diagnosis not present

## 2015-01-04 DIAGNOSIS — K219 Gastro-esophageal reflux disease without esophagitis: Secondary | ICD-10-CM | POA: Diagnosis not present

## 2015-01-04 DIAGNOSIS — Z794 Long term (current) use of insulin: Secondary | ICD-10-CM

## 2015-01-04 DIAGNOSIS — IMO0002 Reserved for concepts with insufficient information to code with codable children: Secondary | ICD-10-CM

## 2015-01-04 LAB — POCT CBC
GRANULOCYTE PERCENT: 51.8 % (ref 37–80)
HEMATOCRIT: 39.9 % (ref 37.7–47.9)
Hemoglobin: 12.4 g/dL (ref 12.2–16.2)
Lymph, poc: 2.5 (ref 0.6–3.4)
MCH: 27.4 pg (ref 27–31.2)
MCHC: 31.1 g/dL — AB (ref 31.8–35.4)
MCV: 88 fL (ref 80–97)
MPV: 8.1 fL (ref 0–99.8)
POC Granulocyte: 3.1 (ref 2–6.9)
POC LYMPH PERCENT: 42 %L (ref 10–50)
Platelet Count, POC: 158 10*3/uL (ref 142–424)
RBC: 4.54 M/uL (ref 4.04–5.48)
RDW, POC: 13.9 %
WBC: 5.9 10*3/uL (ref 4.6–10.2)

## 2015-01-04 LAB — POCT GLYCOSYLATED HEMOGLOBIN (HGB A1C): HEMOGLOBIN A1C: 9.2

## 2015-01-04 NOTE — Telephone Encounter (Signed)
This has been taken care of.

## 2015-01-04 NOTE — Patient Instructions (Addendum)
Medicare Annual Wellness Visit  [place] and the medical providers at [facility] strive to bring you the best medical care.  In doing so we not only want to address your current medical conditions and concerns but also to detect new conditions early and prevent illness, disease and health-related problems.    Medicare offers a yearly Wellness Visit which allows our clinical staff to assess your need for preventative services including immunizations, lifestyle education, counseling to decrease risk of preventable diseases and screening for fall risk and other medical concerns.    This visit is provided free of charge (no copay) for all Medicare recipients. The clinical pharmacists at [facility] have begun to conduct these Wellness Visits which will also include a thorough review of all your medications.    As you primary medical provider recommend that you make an appointment for your Annual Wellness Visit if you have not done so already this year.  You may set up this appointment before you leave today or you may call back ([phone]) and schedule an appointment.  Please make sure when you call that you mention that you are scheduling your Annual Wellness Visit with the clinical pharmacist so that the appointment may be made for the proper length of time.     Continue current medications. Continue good therapeutic lifestyle changes which include good diet and exercise. Fall precautions discussed with patient. If an FOBT was given today- please return it to our front desk. If you are over 61 years old - you may need Prevnar 75 or the adult Pneumonia vaccine.  Flu Shots are still available at our office. If you still haven't had one please call to set up a nurse visit to get one.   After your visit with [name] today you will receive a survey in the mail or online from [employer] regarding your care with [name]. Please take a moment to  fill this out. Your feedback is very important to [name] as you can help [name] better understand your patient needs as well as improve your experience and satisfaction. WE CARE ABOUT YOU!!!   She should continue with her aggressive dietary regimen and follow through with increasing her physical activity with walking as she has indicated. She should continue regular follow-up with the clinical pharmacists last diabetic educator in the office She should continue to work on her weight as much as possible and avoid sodium to help keep her blood pressure under better control We will call her with the lab work results as soon as those results become available

## 2015-01-04 NOTE — Progress Notes (Signed)
Subjective:    Patient ID: [name], female    DOB: 1951/03/29, 64 y.o.   MRN: [id number]  HPI Pt here for follow up and management of chronic medical problems which includes diabetes, hypertension and hyperlipidemia. She is taking medications regularly. The patient has no complaints today. She denies chest pain shortness of breath and only has minimal indigestion and this is helped with taking Nexium. She is having no problems with voiding. She indicates that she is been keeping her blood sugars under better control and she is taking less insulin for about a month and a half. She says she doesn't better with her diet you more salads and fruits and that her blood sugars have not been running as high as in the past and the range is been anywhere from 80-200. She plans to start walking more. She is followed by the clinical pharmacists and diabetic educator regularly here in the office.         Patient Active Problem List   Diagnosis Date Noted  . TIA (transient ischemic attack) 08/14/2014  . DM type 2 with diabetic dyslipidemia 08/14/2014  . Cataract 02/08/2014  . Macular degeneration 02/08/2014  . Elevated d-dimer, neg VQ scan 01/08/2014  . Unstable angina 01/07/2014  . Chest pain 12/30/2013  . Generalized anxiety disorder 09/14/2013  . Claudication 06/18/2013  . Diverticulosis of colon 01/01/2012  . Left lower quadrant pain 01/01/2012  . GERD (gastroesophageal reflux disease) 01/01/2012  . Constipation 01/01/2012  . Hypokalemia 08/24/2011  . Uncontrolled type 2 diabetes mellitus with insulin therapy 08/23/2011  . Essential hypertension, benign 08/23/2011  . PALPITATIONS 02/10/2010  . Hyperlipidemia 05/10/2009  . DEPRESSION 05/10/2009  . CAD, NATIVE VESSEL, cath 01/07/14 non obstructive coronary disease 08/26/2008  . 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 ALPRAZolam (XANAX) 0.5 MG tablet TAKE ONE TABLET  3 TIMES A DAY AS NEEDED.  Marland Kitchen aspirin EC 325 MG tablet Take 325 mg by mouth daily.  Marland Kitchen atenolol (TENORMIN) 50 MG tablet Take 1 tablet (50 mg total) by mouth 2 (two) times daily.  . canagliflozin (INVOKANA) 300 MG TABS tablet Take 300 mg by mouth daily before breakfast.  . citalopram (CELEXA) 20 MG tablet Take 1 tablet (20 mg total) by mouth daily.  . clopidogrel (PLAVIX) 75 MG tablet Take 1 tablet (75 mg total) by mouth daily.  Marland Kitchen ezetimibe ([name]) 10 MG tablet Take 1 tablet (10 mg total) by mouth daily.  . furosemide (LASIX) 40 MG tablet Take 1 tablet (40 mg total) by mouth 2 (two) times daily.  . Insulin Detemir (LEVEMIR FLEXTOUCH) 100 UNIT/ML Pen Inject 35 Units into the skin 2 (two) times daily.  . insulin lispro (HUMALOG KWIKPEN) 100 UNIT/ML KiwkPen Inject up 35 units TID PRN  . isosorbide mononitrate (IMDUR) 120 MG 24 hr tablet Take 120 mg by mouth once a week.  Marland Kitchen lisinopril (PRINIVIL,ZESTRIL) 20 MG tablet Take 1 tablet (20 mg total) by mouth 2 (two) times daily.  . nitroGLYCERIN (NITROSTAT) 0.4 MG SL tablet Place 1 tablet (0.4 mg total) under the tongue every 5 (five) minutes as needed. For chest pain  . potassium chloride SA (K-DUR,KLOR-CON) 20 MEQ tablet Take 1 tablet (20 mEq total) by mouth 2 (two) times daily.  . rosuvastatin (CRESTOR) 40 MG tablet Take 1 tablet (40 mg total) by mouth daily.  . vitamin B-12 (CYANOCOBALAMIN) 1000 MCG tablet Take 1,000 mcg by mouth daily.  Marland Kitchen  zolpidem (AMBIEN) 10 MG tablet TAKE 1/2 TO 1 TABLET AT BEDTIME AS NEEDED  . [DISCONTINUED] ALPRAZolam (XANAX) 0.5 MG tablet TAKE ONE TABLET 3 TIMES A DAY AS NEEDED.  . [DISCONTINUED] amLODipine (NORVASC) 5 MG tablet Take 1 tablet (5 mg total) by mouth daily. 1/2 tablet daily (Patient not taking: Reported on 10/21/2014)  . [DISCONTINUED] Insulin Detemir (LEVEMIR FLEXTOUCH) 100 UNIT/ML Pen Inject 35 Units into the skin 2 (two) times daily.  . [DISCONTINUED] insulin lispro (HUMALOG KWIKPEN) 100 UNIT/ML KiwkPen Inject up 30  units TID PRN  . [DISCONTINUED] potassium chloride SA (K-DUR,KLOR-CON) 20 MEQ tablet Take 1 tablet (20 mEq total) by mouth daily.    Review of Systems  Constitutional: Negative.   HENT: Negative.   Eyes: Negative.   Respiratory: Negative.   Cardiovascular: Negative.   Gastrointestinal: Negative.   Endocrine: Negative.   Genitourinary: Negative.   Musculoskeletal: Negative.   Skin: Negative.   Allergic/Immunologic: Negative.   Neurological: Negative.   Hematological: Negative.   Psychiatric/Behavioral: Negative.        Objective:   Physical Exam  Constitutional: She is oriented to person, place, and time. She appears well-developed and well-nourished. No distress.  Patient is in good spirits and has a positive outlook on her health and condition. She is currently doing with her husband has had a recurrence of urethral cancer.  HENT:  Head: Normocephalic and atraumatic.  Right Ear: External ear normal.  Left Ear: External ear normal.  Nose: Nose normal.  Mouth/Throat: Oropharynx is clear and moist. No oropharyngeal exudate.  Eyes: Conjunctivae and EOM are normal. Pupils are equal, round, and reactive to light. Right eye exhibits no discharge. Left eye exhibits no discharge. No scleral icterus.  Neck: Normal range of motion. Neck supple. No thyromegaly present.  No carotid bruits or adenopathy  Cardiovascular: Normal rate, regular rhythm and normal heart sounds.  Exam reveals no gallop and no friction rub.   No murmur heard. Helped pulses were difficult to palpate. At 84/m  Pulmonary/Chest: Effort normal and breath sounds normal. No respiratory distress. She has no wheezes. She has no rales. She exhibits no tenderness.  Abdominal: Soft. Bowel sounds are normal. She exhibits no mass. There is no tenderness. There is no rebound and no guarding.  No tenderness or masses  Musculoskeletal: Normal range of motion. She exhibits no edema or tenderness.  Lymphadenopathy:    She has no  cervical adenopathy.  Neurological: She is alert and oriented to person, place, and time. She has normal reflexes. No cranial nerve deficit.  Skin: Skin is warm and dry. No rash noted.  Psychiatric: She has a normal mood and affect. Her behavior is normal. Judgment and thought content normal.  Nursing note and vitals reviewed.  BP 114/75 mmHg  Pulse 77  Temp(Src) 97.3 F (36.3 C) (Oral)  Ht $R'5\' 6"'lB$  (1.676 m)  Wt 178 lb (80.74 kg)  BMI 28.74 kg/m2  Results for orders placed or performed in visit on 01/04/15  POCT CBC  Result Value Ref Range   WBC 5.9 4.6 - 10.2 K/uL   Lymph, poc 2.5 0.6 - 3.4   POC LYMPH PERCENT 42.0 10 - 50 %L   POC Granulocyte 3.1 2 - 6.9   Granulocyte percent 51.8 37 - 80 %G   RBC 4.54 4.04 - 5.48 M/uL   Hemoglobin 12.4 12.2 - 16.2 g/dL   HCT, POC 39.9 37.7 - 47.9 %   MCV 88.0 80 - 97 fL   MCH, POC 27.4  27 - 31.2 pg   MCHC 31.1 (A) 31.8 - 35.4 g/dL   RDW, POC 13.9 %   Platelet Count, POC 158 142 - 424 K/uL   MPV 8.1 0 - 99.8 fL  POCT glycosylated hemoglobin (Hb A1C)  Result Value Ref Range   Hemoglobin A1C 9.2%    The patient was informed of the above results before she left the office.      Assessment & Plan:  1. DM type 2 with diabetic dyslipidemia -The hemoglobin A1c is still running somewhat high at 9.2%. The patient will continue to follow-up with the clinical pharmacists and she was encouraged to continue with her diet habits and with her increased activity of walking. - POCT CBC - POCT glycosylated hemoglobin (Hb A1C)  2. Uncontrolled type 2 diabetes mellitus with insulin therapy -As above. - POCT CBC - POCT glycosylated hemoglobin (Hb A1C)  3. Hyperlipemia -She should continue with aggressive therapeutic lifestyle changes as well as her current treatment for [name] - POCT CBC - NMR, lipoprofile  4. Essential hypertension, benign -The blood pressure is good today she should continue with current  treatment - POCT CBC - BMP8+EGFR - Hepatic function panel  5. Vitamin D deficiency -Her vitamin D treatment will be dependent upon the results of her lab work being done today. - POCT CBC - Vit D  25 hydroxy (rtn osteoporosis monitoring)  6. Gastroesophageal reflux disease, esophagitis presence not specified -She is having minimal complaints with reflux disease because of her taking her medicine for reflux. - POCT CBC - Hepatic function panel  7. Congestive heart failure, unspecified congestive heart failure chronicity, unspecified congestive heart failure type -This is followed regularly by her cardiologist. - POCT CBC - BMP8+EGFR - Hepatic function panel - NMR, lipoprofile  Patient Instructions                       Medicare Annual Wellness Visit  [facility] and the medical providers at [facility] strive to bring you the best medical care.  In doing so we not only want to address your current medical conditions and concerns but also to detect new conditions early and prevent illness, disease and health-related problems.    Medicare offers a yearly Wellness Visit which allows our clinical staff to assess your need for preventative services including immunizations, lifestyle education, counseling to decrease risk of preventable diseases and screening for fall risk and other medical concerns.    This visit is provided free of charge (no copay) for Karen Medicare recipients. The clinical pharmacists at [facility] have begun to conduct these Wellness Visits which will also include a thorough review of Karen your medications.    As you primary medical provider recommend that you make an appointment for your Annual Wellness Visit if you have not done so already this year.  You may set up this appointment before you leave today or you may call back ([phone]) and schedule an appointment.  Please make sure when you call that you mention that you  are scheduling your Annual Wellness Visit with the clinical pharmacist so that the appointment may be made for the proper length of time.     Continue current medications. Continue good therapeutic lifestyle changes which include good diet and exercise. Fall precautions discussed with patient. If an FOBT was given today- please return it to our front desk. If you are over 35 years old -  you may need Prevnar 13 or the adult Pneumonia vaccine.  Flu Shots are still available at our office. If you still haven't had one please call to set up a nurse visit to get one.   After your visit with [name] today you will receive a survey in the mail or online from [employer] regarding your care with [name]. Please take a moment to fill this out. Your feedback is very important to [name] as you can help [name] better understand your patient needs as well as improve your experience and satisfaction. WE CARE ABOUT YOU!!!   She should continue with her aggressive dietary regimen and follow through with increasing her physical activity with walking as she has indicated. She should continue regular follow-up with the clinical pharmacists last diabetic educator in the office She should continue to work on her weight as much as possible and avoid sodium to help keep her blood pressure under better control We will call her with the lab work results as soon as those results become available   Arrie Senate MD

## 2015-01-05 LAB — NMR, LIPOPROFILE
Cholesterol: 158 mg/dL (ref 100–199)
HDL CHOLESTEROL BY NMR: 36 mg/dL — AB (ref >39)
HDL PARTICLE NUMBER: 27.7 umol/L — AB (ref >=30.5)
LDL PARTICLE NUMBER: 1351 nmol/L — AB (ref <1000)
LDL Size: 20 nm (ref >20.5)
LDL-C: 96 mg/dL (ref 0–99)
LP-IR Score: 73 — ABNORMAL HIGH (ref <=45)
SMALL LDL PARTICLE NUMBER: 922 nmol/L — AB (ref <=527)
Triglycerides by NMR: 132 mg/dL (ref 0–149)

## 2015-01-05 LAB — HEPATIC FUNCTION PANEL
ALT: 19 IU/L (ref 0–32)
AST: 20 IU/L (ref 0–40)
Albumin: 4.1 g/dL (ref 3.6–4.8)
Alkaline Phosphatase: 51 IU/L (ref 39–117)
BILIRUBIN TOTAL: 0.3 mg/dL (ref 0.0–1.2)
Bilirubin, Direct: 0.13 mg/dL (ref 0.00–0.40)
Total Protein: 6.6 g/dL (ref 6.0–8.5)

## 2015-01-05 LAB — BMP8+EGFR
BUN/Creatinine Ratio: 14 (ref 11–26)
BUN: 13 mg/dL (ref 8–27)
CALCIUM: 9.6 mg/dL (ref 8.7–10.3)
CHLORIDE: 102 mmol/L (ref 97–108)
CO2: 23 mmol/L (ref 18–29)
CREATININE: 0.9 mg/dL (ref 0.57–1.00)
GFR calc Af Amer: 77 mL/min/{1.73_m2} (ref >59)
GFR calc non Af Amer: 67 mL/min/{1.73_m2} (ref >59)
Glucose: 122 mg/dL — ABNORMAL HIGH (ref 65–99)
Potassium: 3.7 mmol/L (ref 3.5–5.2)
SODIUM: 142 mmol/L (ref 134–144)

## 2015-01-05 LAB — VITAMIN D 25 HYDROXY (VIT D DEFICIENCY, FRACTURES): Vit D, 25-Hydroxy: 22.3 ng/mL — ABNORMAL LOW (ref 30.0–100.0)

## 2015-01-07 ENCOUNTER — Telehealth: Payer: Self-pay | Admitting: *Deleted

## 2015-01-07 NOTE — Telephone Encounter (Signed)
ttc pt regarding test results, no vm, couldn't leave message.

## 2015-01-07 NOTE — Telephone Encounter (Signed)
-----   Message from Chipper Herb, MD sent at 01/05/2015  1:45 PM EDT ----- The blood sugar is elevated at 122 and this is much much improved from past blood sugar readings. The creatinine, the most important kidney function test is within normal limits. The electrolytes including potassium are within normal limits. All liver function tests are within normal limits Cholesterol numbers with advanced lipid testing are also much improved not at goal yet but much much improved. The LDL C is less than 100 at 96. The triglycerides are much improved and now within normal limits. The good cholesterol or the HDL particle number is higher than its been in the past and this is also good. The patient should continue with her aggressive therapeutic lifestyle changes that she has been doing recently and hopefully these numbers will continue to come down even more The vitamin D level is low but improved from 8 months ago.--- Please start the patient on vitamin D 50,000 units #12 1 weekly with 1 refill-----check vitamin D at next visit

## 2015-01-13 ENCOUNTER — Other Ambulatory Visit: Payer: Self-pay | Admitting: *Deleted

## 2015-01-13 DIAGNOSIS — E559 Vitamin D deficiency, unspecified: Secondary | ICD-10-CM

## 2015-01-13 MED ORDER — VITAMIN D (ERGOCALCIFEROL) 1.25 MG (50000 UNIT) PO CAPS: 50000.0000 [IU] | ORAL_CAPSULE | ORAL | Status: DC

## 2015-03-09 ENCOUNTER — Other Ambulatory Visit: Payer: Self-pay | Admitting: Family Medicine

## 2015-03-17 ENCOUNTER — Telehealth: Payer: Self-pay | Admitting: Pharmacist

## 2015-03-17 MED ORDER — INSULIN GLARGINE 300 UNIT/ML ~~LOC~~ SOPN: 70.0000 [IU] | PEN_INJECTOR | Freq: Every day | SUBCUTANEOUS | Status: DC

## 2015-03-17 MED ORDER — CANAGLIFLOZIN 300 MG PO TABS: 300.0000 mg | ORAL_TABLET | Freq: Every day | ORAL | Status: DC

## 2015-03-17 NOTE — Telephone Encounter (Signed)
Spoke with patient and she has administered 30 units of Humalog. BG has decreased to 435.   I have left her samples for Toujeo insulin - administer 70 units daily as we do not have any samples of Levemir or lantus.  Patient states she is also out of [place].  I have left her samples of Invokana but advised that she not start Invokana until BG is back to 250 or less for 2 days.

## 2015-03-24 ENCOUNTER — Telehealth: Payer: Self-pay | Admitting: Pharmacist

## 2015-03-24 MED ORDER — INSULIN GLARGINE 300 UNIT/ML ~~LOC~~ SOPN
70.0000 [IU] | PEN_INJECTOR | Freq: Every day | SUBCUTANEOUS | Status: DC
Start: 2015-03-24 — End: 2015-04-25

## 2015-03-24 NOTE — Telephone Encounter (Signed)
Patient given #2 samples of Toujeo

## 2015-03-25 ENCOUNTER — Other Ambulatory Visit: Payer: Self-pay | Admitting: Family Medicine

## 2015-03-25 ENCOUNTER — Telehealth: Payer: Self-pay | Admitting: Pharmacist

## 2015-03-25 MED ORDER — ISOSORBIDE MONONITRATE ER 120 MG PO TB24: 120.0000 mg | ORAL_TABLET | Freq: Every day | ORAL | Status: DC

## 2015-03-25 NOTE — Telephone Encounter (Signed)
Refill called to pharmacy.

## 2015-03-25 NOTE — Telephone Encounter (Signed)
Last seen 01/04/15 DWM  If approved route to nurse to call into [facility]

## 2015-03-25 NOTE — Telephone Encounter (Signed)
Please call in ambien  with 0 refills 

## 2015-03-25 NOTE — Telephone Encounter (Signed)
Spoke with Horticulturist, commercial.  According to their records as of today patient has spent $3294.65.  To reach coverage gap amount is $3310.00.   Patient was notified.  Also noticed that isosorbide ER 120mg  sig was 1 tablet q week.  Verified with patient and although she has not taken in the last few months - it is suppose to be QD.  Rx what corrected and new rx sent in.  Patient is going to restart.

## 2015-04-01 ENCOUNTER — Ambulatory Visit: Payer: Self-pay | Admitting: *Deleted

## 2015-04-06 ENCOUNTER — Other Ambulatory Visit: Payer: Self-pay | Admitting: Family Medicine

## 2015-04-06 ENCOUNTER — Encounter: Payer: Self-pay | Admitting: Pharmacist

## 2015-04-06 ENCOUNTER — Ambulatory Visit (INDEPENDENT_AMBULATORY_CARE_PROVIDER_SITE_OTHER): Payer: Commercial Managed Care - HMO | Admitting: Pharmacist

## 2015-04-06 ENCOUNTER — Ambulatory Visit (INDEPENDENT_AMBULATORY_CARE_PROVIDER_SITE_OTHER): Payer: Commercial Managed Care - HMO

## 2015-04-06 VITALS — BP 148/80 | HR 77 | Ht 66.0 in | Wt 178.5 lb

## 2015-04-06 DIAGNOSIS — M899 Disorder of bone, unspecified: Secondary | ICD-10-CM | POA: Diagnosis not present

## 2015-04-06 DIAGNOSIS — E1165 Type 2 diabetes mellitus with hyperglycemia: Secondary | ICD-10-CM | POA: Diagnosis not present

## 2015-04-06 DIAGNOSIS — M8580 Other specified disorders of bone density and structure, unspecified site: Secondary | ICD-10-CM

## 2015-04-06 DIAGNOSIS — Z780 Asymptomatic menopausal state: Secondary | ICD-10-CM

## 2015-04-06 DIAGNOSIS — E785 Hyperlipidemia, unspecified: Secondary | ICD-10-CM

## 2015-04-06 DIAGNOSIS — Z0000 Encounter for general adult medical examination without abnormal findings: Secondary | ICD-10-CM | POA: Diagnosis not present

## 2015-04-06 DIAGNOSIS — E559 Vitamin D deficiency, unspecified: Secondary | ICD-10-CM

## 2015-04-06 DIAGNOSIS — I10 Essential (primary) hypertension: Secondary | ICD-10-CM

## 2015-04-06 DIAGNOSIS — IMO0002 Reserved for concepts with insufficient information to code with codable children: Secondary | ICD-10-CM

## 2015-04-06 DIAGNOSIS — Z794 Long term (current) use of insulin: Secondary | ICD-10-CM

## 2015-04-06 DIAGNOSIS — E669 Obesity, unspecified: Secondary | ICD-10-CM

## 2015-04-06 LAB — GLUCOSE, POCT (MANUAL RESULT ENTRY): POC GLUCOSE: 113 mg/dL — AB (ref 70–99)

## 2015-04-06 LAB — POCT GLYCOSYLATED HEMOGLOBIN (HGB A1C): HEMOGLOBIN A1C: 10.2

## 2015-04-06 MED ORDER — ALPRAZOLAM 0.5 MG PO TABS: ORAL_TABLET | ORAL | Status: DC

## 2015-04-06 MED ORDER — INSULIN GLARGINE 100 UNIT/ML SOLOSTAR PEN: 70.0000 [IU] | PEN_INJECTOR | Freq: Every day | SUBCUTANEOUS | Status: DC

## 2015-04-06 MED ORDER — LISINOPRIL 40 MG PO TABS: 40.0000 mg | ORAL_TABLET | Freq: Every day | ORAL | Status: DC

## 2015-04-06 NOTE — Patient Instructions (Addendum)
  [name] , Thank you for taking time to come for your Medicare Wellness Visit. I appreciate your ongoing commitment to your health goals. Please review the following plan we discussed and let me know if I can assist you in the future.    This is a list of the screening recommended for you and due dates:  Health Maintenance  Topic Date Due  . Shingles Vaccine  Checked price today - $106.41  . Eye exam for diabetics  12/23/2013  . DEXA scan (bone density measurement)  Done today  . Pneumonia vaccines (2 of 2 - PPSV23) Completed  . Flu Shot  04/25/2015  . Urine Protein Check  Done today  . Mammogram  06/12/2015  . Hemoglobin A1C  07/06/2015  . Stool Blood Test  08/25/2015  . Complete foot exam   01/04/2016  . Tetanus Vaccine  10/25/2020  . Colon Cancer Screening  01/09/2022

## 2015-04-06 NOTE — Progress Notes (Signed)
Patient ID: [name], female   DOB: 12-Aug-1951, 64 y.o.   MRN: [id number]    Subjective:   [name] is a 64 y.o. female who presents for an Initial Medicare Annual Wellness Visit, recheck diabetes and osteopenia.   [name] is a well appearing WF.  She is married.   She has several health related concerns. Most center around being able to afford medications and how not being able to get insulin at times has affected her BG.  She is close if not already in the Medicare coverage gap. In the past she has qualified for assistance from [facility].  Over the last month I have been able to supply her with insulin and Invokana sample until she hears from this program.   She reports that BG has been much better - 90 to 180's since she has received samples. She also knows that she is due to have eyes checked but she states that she cannot afford to see the ophthalmologist right now. She also reports that she is only taking lisinopril 47m 1 tablet daily because she tought that she only needed 1 tablet daily not two.  BP was elevated today in office.  Current Medications (verified) Outpatient Encounter Prescriptions as of 04/06/2015  Medication Sig  . ALPRAZolam (XANAX) 0.5 MG tablet TAKE ONE TABLET 3 TIMES A DAY AS NEEDED.  .Marland Kitchenaspirin EC 325 MG tablet Take 325 mg by mouth daily.  .Marland Kitchenatenolol (TENORMIN) 50 MG tablet Take 1 tablet (50 mg total) by mouth 2 (two) times daily.  . canagliflozin (INVOKANA) 300 MG TABS tablet Take 300 mg by mouth daily before breakfast.  . citalopram (CELEXA) 20 MG tablet Take 1 tablet (20 mg total) by mouth daily.  . clopidogrel (PLAVIX) 75 MG tablet Take 1 tablet (75 mg total) by mouth daily.  . furosemide (LASIX) 40 MG tablet Take 1 tablet (40 mg total) by mouth 2 (two) times daily.  . Insulin Glargine (TOUJEO SOLOSTAR) 300 UNIT/ML SOPN Inject 70 Units into the skin daily. Use in place of Levemir until able to get through patient  assistance.  . insulin lispro (HUMALOG KWIKPEN) 100 UNIT/ML KiwkPen Inject up 35 units TID PRN  . isosorbide mononitrate (IMDUR) 120 MG 24 hr tablet Take 1 tablet (120 mg total) by mouth daily.  .Marland Kitchenlisinopril (PRINIVIL,ZESTRIL) 20 MG tablet Take 1 tablet (20 mg total) by mouth 2 (two) times daily. (Patient taking differently: Take 20 mg by mouth daily. )  . potassium chloride SA (K-DUR,KLOR-CON) 20 MEQ tablet Take 1 tablet (20 mEq total) by mouth 2 (two) times daily.  . rosuvastatin (CRESTOR) 40 MG tablet Take 1 tablet (40 mg total) by mouth daily.  .Marland Kitchenzolpidem (AMBIEN) 10 MG tablet TAKE 1/2 TO 1 TABLET AT BEDTIME AS NEEDED  . [DISCONTINUED] ALPRAZolam (XANAX) 0.5 MG tablet TAKE ONE TABLET 3 TIMES A DAY AS NEEDED.  .Marland KitchenInsulin Detemir (LEVEMIR FLEXTOUCH) 100 UNIT/ML Pen Inject 35 Units into the skin 2 (two) times daily. (Patient not taking: Reported on 04/06/2015)  . Insulin Glargine (LANTUS SOLOSTAR) 100 UNIT/ML Solostar Pen Inject 70 Units into the skin daily. Use in place of Levemir or Toujeo  . nitroGLYCERIN (NITROSTAT) 0.4 MG SL tablet Place 1 tablet (0.4 mg total) under the tongue every 5 (five) minutes as needed. For chest pain  . vitamin B-12 (CYANOCOBALAMIN) 1000 MCG tablet Take 1,000 mcg by mouth daily.  . Vitamin D, Ergocalciferol, (DRISDOL) 50000 UNITS CAPS capsule Take  1 capsule (50,000 Units total) by mouth every 7 (seven) days. (Patient not taking: Reported on 04/06/2015)  . [DISCONTINUED] ezetimibe (ZETIA) 10 MG tablet Take 1 tablet (10 mg total) by mouth daily. (Patient not taking: Reported on 04/06/2015)  . [DISCONTINUED] furosemide (LASIX) 40 MG tablet TAKE ONE TABLET BY MOUTH TWICE DAILY   No facility-administered encounter medications on file as of 04/06/2015.    Allergies (verified) Iohexol; Ticlid; and Codeine   History: Past Medical History  Diagnosis Date  . Diverticulitis, colon   . MS (multiple sclerosis)     Not confirmed  . Depression   . PVD (peripheral vascular  disease)   . HLD (hyperlipidemia)   . Essential hypertension, benign   . CAD (coronary artery disease)     DES to circumflex 02/2007, BMS to LAD and PTCA diagonal 03/2007  . Carotid artery plaque     Mild  . GERD (gastroesophageal reflux disease)   . NSTEMI (non-ST elevated myocardial infarction)     02/2007  . Prolapse of uterus   . TIA (transient ischemic attack) 1980's  . PAT (paroxysmal atrial tachycardia)   . Anxiety   . CHF (congestive heart failure)   . Anginal pain   . Pneumonia 1980's    "once"  . IDDM (insulin dependent diabetes mellitus)   . H/O hiatal hernia   . Migraine     "used to have them really bad; don't have them anymore" (01/07/2014)  . Elevated d-dimer 01/08/2014  . Cataract    Past Surgical History  Procedure Laterality Date  . Appendectomy  ~ 1970  . Cholecystectomy  ?1987  . Breast biopsy Right 1980's  . Colonoscopy  2002    Dr. Anwar--> Severe diverticular changes in the region of the sigmoid and descending colon with scattered diverticular changes throughout the rest of the colon. No polyps, ulcerations. Despite numerous manipulations, the tip of the scope could not be tipped into the cecal area.  . Colonoscopy  01/10/2012    Procedure: COLONOSCOPY;  Surgeon: Daneil Dolin, MD;  Location: [facility];  Service: Endoscopy;  Laterality: N/A;  1:55  . Coronary angioplasty with stent placement  ~ 1997 X 2    "2 + 1"  . Cardiac catheterization  01/07/2014  . Abdominal hysterectomy  1986    ovaries remain - prolaspe uterus   . Breast lumpectomy Right 1980's    Dr. Charlynne Pander   . Eye surgery Bilateral 2014    cataract  . Left heart catheterization with coronary angiogram N/A 01/07/2014    Procedure: LEFT HEART CATHETERIZATION WITH CORONARY ANGIOGRAM;  Surgeon: Larey Dresser, MD;  Location: [facility] CATH LAB;  Service: Cardiovascular;  Laterality: N/A;   Family History  Problem Relation Age of Onset  . Heart attack Mother 41  . Diabetes Father   . Heart  attack Father 35  . Heart attack Brother 30    x6  . Colon cancer Paternal Aunt     28s, died with brain anuerysm  . Crohn's disease Cousin     paternal   Social History   Occupational History  . Disability   .     Social History Main Topics  . Smoking status: Passive Smoke Exposure - Never Smoker  . Smokeless tobacco: Never Used     Comment: spouse, 63 years - husband has quit 01/2011  . Alcohol Use: No  . Drug Use: No  . Sexual Activity: No    Do you feel safe at  home?  Yes  Dietary issues and exercise activities: Current Exercise Habits:: The patient does not participate in regular exercise at present  Current Dietary habits:  Tried to limit portion sizes and CHO intake   Objective:    Today's Vitals   04/06/15 1053  BP: 148/80  Pulse: 77  Height: _0  (1.676 m)  Weight: 178 lb 8 oz (80.967 kg)  PainSc: 4   PainLoc: Back   Body mass index is 28.82 kg/(m^2).  A1c = 10.2% today  DEXA Results Date of Test T-Score for [facility] Spine L1-L4 T-Score for Total Left Hip T-Score for Total Right Hip  04/06/2015 -1.1 -0.8 -0.8  03/16/2012 -0.6 -0.4 -0.4             T-Score at neck of right hip was -1.8  FRAX 10 year estimate: Total FX risk:  10%  (consider medication if >/= 20%) Hip FX risk:  1.4%  (consider medication if >/= 3%)  Activities of Daily Living In your present state of health, do you have any difficulty performing the following activities: 04/06/2015 08/14/2014  Hearing? N N  Vision? Y N  Difficulty concentrating or making decisions? N N  Walking or climbing stairs? N N  Dressing or bathing? N N  Doing errands, shopping? N N  Preparing Food and eating ? N -  Using the Toilet? N -  In the past six months, have you accidently leaked urine? N -  Do you have problems with loss of bowel control? N -  Managing your Medications? Y -  Managing your Finances? N -  Housekeeping or managing your Housekeeping? N -    Are there smokers in your home (other  than you)? No   Cardiac Risk Factors include: advanced age (>76mn, >>42women);diabetes mellitus;dyslipidemia;hypertension;obesity (BMI >30kg/m2)  Depression Screen PHQ 2/9 Scores 04/06/2015 01/04/2015 08/30/2014 02/08/2014  PHQ - 2 Score 3 0 1 0  PHQ- 9 Score 5 - - -    Fall Risk Fall Risk  04/06/2015 01/04/2015 08/30/2014 02/08/2014 10/29/2013  Falls in the past year? No No Yes Yes No  Number falls in past yr: - - 2 or more 1 -  Risk for fall due to : - - History of fall(s) History of fall(s);Impaired balance/gait -    Cognitive Function: MMSE - Mini Mental State Exam 04/06/2015  Orientation to time 5  Orientation to Place 5  Registration 3  Attention/ Calculation 5  Recall 3  Language- name 2 objects 2  Language- repeat 1  Language- follow 3 step command 3  Language- read & follow direction 1  Write a sentence 1  Copy design 1  Total score 30    Immunizations and Health Maintenance Immunization History  Administered Date(s) Administered  . Influenza-Unspecified 05/25/2014  . Pneumococcal Conjugate-13 02/08/2014  . Tdap 11/22/2010   Health Maintenance Due  Topic Date Due  . ZOSTAVAX  11/22/2008  . OPHTHALMOLOGY EXAM  12/23/2013  . DEXA SCAN  03/19/2014  . PNA vac Low Risk Adult (2 of 2 - PPSV23) 02/09/2015    Patient Care Team: DChipper Herb MD as PCP - General (Family Medicine) RDaneil Dolin MD (Gastroenterology) JMinus Breeding MD (Cardiology)  Indicate any recent Medical Services you may have received from other than [facility] providers in the past year (date may be approximate).    Assessment:    Annual Wellness Visit  Type 2 DM, on insulin - uncontrolled HTN - uncontrolled secondary to non compliance. Depression - worsened  by family stress but she is taking citalopram and report that she has good support through her church and pastor. Obesity Osteopenia - BMD is stable.  Low fracture risk currently.   Screening Tests Health Maintenance  Topic Date Due  .  ZOSTAVAX  11/22/2008  . OPHTHALMOLOGY EXAM  12/23/2013  . DEXA SCAN  03/19/2014  . PNA vac Low Risk Adult (2 of 2 - PPSV23) 02/09/2015  . INFLUENZA VACCINE  04/25/2015  . URINE MICROALBUMIN  04/27/2015  . MAMMOGRAM  06/12/2015  . HEMOGLOBIN A1C  07/06/2015  . COLON CANCER SCREENING ANNUAL FOBT  08/25/2015  . FOOT EXAM  01/04/2016  . TETANUS/TDAP  10/25/2020  . COLONOSCOPY  01/09/2022        Plan:   During the course of the visit Jesika was educated and counseled about the following appropriate screening and preventive services:   Vaccines to include Pneumoccal, Influenza, Hepatitis B, Td, Zostavax - UTD except Hep B and Zostavax - decided not to get either of these due to cost.  Colorectal cancer screening - UTD  Cardiovascular disease screening - ECHO UTD; EKG due 07/2015;   BP elevated - increase lisinopril back to 88m daily (take 2 tabs of 268muntil finished then switch to 4011m tablet daily)  Diabetes - samples of Lantus, Invokana and Novolog given in office today.  Also left message with [facility] to see if patient qualifies for assistance.  Bone Denisty / Osteoporosis Screening - BMD done today.  Increase calcium intake through diet and/or supplmentation.  Continue vitamin D supplementation.  Fall prevention discussed.   Mammogram - patient [facility]  PAP - UTD  Glaucoma screening / Diabetic Eye Exam - Need patient to try to schedule in 1-2 months  Nutrition counseling - reviewed CHO counting diet.   Increase exercise.   Orders Placed This Encounter  Procedures  . Microalbumin / creatinine urine ratio  . CMP14+EGFR  . Lipid panel  . LDL cholesterol, direct  . Thyroid Panel With TSH  . Vit D  25 hydroxy (rtn osteoporosis monitoring)  . POCT glycosylated hemoglobin (Hb A1C)  . POCT glucose (manual entry)     Goals    . Increase physical activity     Goal is  150 minutes per week     . Reduce sugar intake       Increase non-starchy vegetables - carrots, green bean, squash, zucchini, tomatoes, onions, peppers, spinach and other green leafy vegetables, cabbage, lettuce, cucumbers, asparagus, okra (not fried), eggplant limit sugar and processed foods (cakes, cookies, ice cream, crackers and chips) Increase fresh fruit but limit serving sizes 1/2 cup or about the size of tennis or baseball limit red meat to no more than 1-2 times per week (serving size about the size of your palm) Choose whole grains / lean proteins - whole wheat bread, quinoa, whole grain rice (1/2 cup), fish, chicken, [place]      Patient Instructions (the written plan) were given to the patient.   EckCherre RobinsHAGrandview Surgery And Laser Center7/13/2016

## 2015-04-07 LAB — LIPID PANEL
CHOL/HDL RATIO: 3.4 ratio (ref 0.0–4.4)
Cholesterol, Total: 147 mg/dL (ref 100–199)
HDL: 43 mg/dL (ref >39)
LDL Calculated: 81 mg/dL (ref 0–99)
Triglycerides: 116 mg/dL (ref 0–149)
VLDL Cholesterol Cal: 23 mg/dL (ref 5–40)

## 2015-04-07 LAB — CMP14+EGFR
ALT: 14 IU/L (ref 0–32)
AST: 20 IU/L (ref 0–40)
Albumin/Globulin Ratio: 1.7 (ref 1.1–2.5)
Albumin: 4 g/dL (ref 3.6–4.8)
Alkaline Phosphatase: 52 IU/L (ref 39–117)
BUN/Creatinine Ratio: 9 — ABNORMAL LOW (ref 11–26)
BUN: 8 mg/dL (ref 8–27)
Bilirubin Total: 0.3 mg/dL (ref 0.0–1.2)
CO2: 23 mmol/L (ref 18–29)
CREATININE: 0.89 mg/dL (ref 0.57–1.00)
Calcium: 9.4 mg/dL (ref 8.7–10.3)
Chloride: 103 mmol/L (ref 97–108)
GFR calc Af Amer: 78 mL/min/{1.73_m2} (ref >59)
GFR, EST NON AFRICAN AMERICAN: 68 mL/min/{1.73_m2} (ref >59)
GLUCOSE: 107 mg/dL — AB (ref 65–99)
Globulin, Total: 2.4 g/dL (ref 1.5–4.5)
Potassium: 3.7 mmol/L (ref 3.5–5.2)
SODIUM: 144 mmol/L (ref 134–144)
Total Protein: 6.4 g/dL (ref 6.0–8.5)

## 2015-04-07 LAB — THYROID PANEL WITH TSH
FREE THYROXINE INDEX: 1.7 (ref 1.2–4.9)
T3 Uptake Ratio: 29 % (ref 24–39)
T4, Total: 5.9 ug/dL (ref 4.5–12.0)
TSH: 1.55 u[IU]/mL (ref 0.450–4.500)

## 2015-04-07 LAB — VITAMIN D 25 HYDROXY (VIT D DEFICIENCY, FRACTURES): VIT D 25 HYDROXY: 25.4 ng/mL — AB (ref 30.0–100.0)

## 2015-04-07 LAB — LDL CHOLESTEROL, DIRECT: LDL DIRECT: 93 mg/dL (ref 0–99)

## 2015-04-08 LAB — MICROALBUMIN / CREATININE URINE RATIO
CREATININE, UR: 104.3 mg/dL
MICROALB/CREAT RATIO: 19 mg/g creat (ref 0.0–30.0)
Microalbumin, Urine: 19.8 ug/mL

## 2015-04-15 NOTE — Progress Notes (Signed)
Verified with the [facility] and patient's last mammogram was in 2014.  I notified patient that she is due to have mammogram and she stated she would contact the [facility] to make appt.

## 2015-04-25 ENCOUNTER — Telehealth: Payer: Self-pay | Admitting: Pharmacist

## 2015-04-25 MED ORDER — INSULIN GLARGINE 300 UNIT/ML ~~LOC~~ SOPN: 70.0000 [IU] | PEN_INJECTOR | Freq: Every day | SUBCUTANEOUS | Status: DC

## 2015-04-25 NOTE — Telephone Encounter (Signed)
Left #2 smaples for patient

## 2015-05-09 ENCOUNTER — Telehealth: Payer: Self-pay | Admitting: Pharmacist

## 2015-05-09 MED ORDER — INSULIN GLARGINE 300 UNIT/ML ~~LOC~~ SOPN: 70.0000 [IU] | PEN_INJECTOR | Freq: Every day | SUBCUTANEOUS | Status: DC

## 2015-05-09 NOTE — Telephone Encounter (Signed)
Left #2 Tougjeo samples for patient and #1 Novolog (use in place of Humalog)  Patient is in coverage gap and is waiting to received letter from insurance stating that she is in coverage gap.  She will then be able to receive assistance from [facility] Patient Assistance.

## 2015-05-20 ENCOUNTER — Telehealth: Payer: Self-pay | Admitting: Pharmacist

## 2015-05-20 NOTE — Telephone Encounter (Signed)
Patient told to bring in anytime and I will be glad to fax to [facility].

## 2015-05-23 ENCOUNTER — Ambulatory Visit: Payer: Commercial Managed Care - HMO | Admitting: Family Medicine

## 2015-05-24 ENCOUNTER — Telehealth: Payer: Self-pay | Admitting: Family Medicine

## 2015-05-25 ENCOUNTER — Telehealth: Payer: Self-pay | Admitting: Family Medicine

## 2015-05-25 NOTE — Telephone Encounter (Signed)
Patient called.  [name] did not receive my fax from Monday.  Advised patient to pick up paperwork and take to her.  Paperwork left at front desk.

## 2015-05-25 NOTE — Telephone Encounter (Signed)
This was done Monday 05/23/2015

## 2015-06-07 ENCOUNTER — Other Ambulatory Visit: Payer: Self-pay | Admitting: Family Medicine

## 2015-06-08 NOTE — Telephone Encounter (Signed)
[facility] Warrick Parisian.

## 2015-06-08 NOTE — Telephone Encounter (Signed)
[facility] pt, last filled 04/06/15, last seen 01/04/15, next appt 07/08/15. Route to pools, nurse call in at [facility]

## 2015-06-08 NOTE — Telephone Encounter (Signed)
Okay to do refill. Caryl Pina, MD [facility] 06/08/2015, 11:36 AM

## 2015-06-28 ENCOUNTER — Ambulatory Visit (INDEPENDENT_AMBULATORY_CARE_PROVIDER_SITE_OTHER): Payer: Commercial Managed Care - HMO

## 2015-06-28 DIAGNOSIS — Z23 Encounter for immunization: Secondary | ICD-10-CM

## 2015-07-08 ENCOUNTER — Ambulatory Visit (INDEPENDENT_AMBULATORY_CARE_PROVIDER_SITE_OTHER): Payer: Commercial Managed Care - HMO | Admitting: Family Medicine

## 2015-07-08 ENCOUNTER — Encounter: Payer: Self-pay | Admitting: Family Medicine

## 2015-07-08 VITALS — BP 123/77 | HR 84 | Temp 97.4°F | Ht 66.0 in | Wt 183.0 lb

## 2015-07-08 DIAGNOSIS — IMO0002 Reserved for concepts with insufficient information to code with codable children: Secondary | ICD-10-CM

## 2015-07-08 DIAGNOSIS — E785 Hyperlipidemia, unspecified: Secondary | ICD-10-CM

## 2015-07-08 DIAGNOSIS — K219 Gastro-esophageal reflux disease without esophagitis: Secondary | ICD-10-CM | POA: Diagnosis not present

## 2015-07-08 DIAGNOSIS — Z794 Long term (current) use of insulin: Secondary | ICD-10-CM | POA: Diagnosis not present

## 2015-07-08 DIAGNOSIS — E559 Vitamin D deficiency, unspecified: Secondary | ICD-10-CM | POA: Diagnosis not present

## 2015-07-08 DIAGNOSIS — I10 Essential (primary) hypertension: Secondary | ICD-10-CM

## 2015-07-08 DIAGNOSIS — E1165 Type 2 diabetes mellitus with hyperglycemia: Secondary | ICD-10-CM

## 2015-07-08 LAB — POCT GLYCOSYLATED HEMOGLOBIN (HGB A1C): Hemoglobin A1C: 8.8

## 2015-07-08 NOTE — Progress Notes (Signed)
Subjective:    Patient ID: [name], female    DOB: 09-08-1951, 64 y.o.   MRN: [id number]  HPI Pt here for follow up and management of chronic medical problems which includes diabetes, hyperlipidemia, and hypertension. She is taking medications regularly. This patient generally has no specific complaints but does have a lot of medical problems and issues which sometimes do not get taken good control. One of these problems is her poorly controlled diabetes.      Patient Active Problem List   Diagnosis Date Noted  . TIA (transient ischemic attack) 08/14/2014  . DM type 2 with diabetic dyslipidemia ([place]) 08/14/2014  . Cataract 02/08/2014  . Macular degeneration 02/08/2014  . Elevated d-dimer, neg VQ scan 01/08/2014  . Unstable angina ([place]) 01/07/2014  . Chest pain 12/30/2013  . Generalized anxiety disorder 09/14/2013  . Claudication ([place]) 06/18/2013  . Diverticulosis of colon 01/01/2012  . Left lower quadrant pain 01/01/2012  . GERD (gastroesophageal reflux disease) 01/01/2012  . Constipation 01/01/2012  . Hypokalemia 08/24/2011  . Uncontrolled type 2 diabetes mellitus with insulin therapy ([place]) 08/23/2011  . Essential hypertension, benign 08/23/2011  . PALPITATIONS 02/10/2010  . Hyperlipidemia 05/10/2009  . DEPRESSION 05/10/2009  . CAD, NATIVE VESSEL, cath 01/07/14 non obstructive coronary disease 08/26/2008  . 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 08/26/2008   Outpatient Encounter Prescriptions as of 07/08/2015  Medication Sig  . ALPRAZolam (XANAX) 0.5 MG tablet TAKE ONE TABLET 3 TIMES A DAY AS NEEDED.  Marland Kitchen aspirin EC 325 MG tablet Take 325 mg by mouth daily.  Marland Kitchen atenolol (TENORMIN) 50 MG tablet Take 1 tablet (50 mg total) by mouth 2 (two) times daily.  . canagliflozin (INVOKANA) 300 MG TABS tablet Take 300 mg by mouth daily before breakfast.  . citalopram (CELEXA) 20 MG tablet Take 1 tablet (20 mg total) by mouth daily.  . clopidogrel (PLAVIX) 75 MG tablet Take 1 tablet (75 mg total)  by mouth daily.  . furosemide (LASIX) 40 MG tablet Take 1 tablet (40 mg total) by mouth 2 (two) times daily.  . Insulin Detemir (LEVEMIR FLEXTOUCH) 100 UNIT/ML Pen Inject 35 Units into the skin 2 (two) times daily.  . Insulin Glargine (LANTUS SOLOSTAR) 100 UNIT/ML Solostar Pen Inject 70 Units into the skin daily. Use in place of Levemir or Toujeo  . Insulin Glargine (TOUJEO SOLOSTAR) 300 UNIT/ML SOPN Inject 70 Units into the skin daily. Use in place of Levemir until able to get through patient assistance.  . insulin lispro (HUMALOG KWIKPEN) 100 UNIT/ML KiwkPen Inject up 35 units TID PRN  . isosorbide mononitrate (IMDUR) 120 MG 24 hr tablet Take 1 tablet (120 mg total) by mouth daily.  Marland Kitchen lisinopril (PRINIVIL,ZESTRIL) 40 MG tablet Take 1 tablet (40 mg total) by mouth daily.  . nitroGLYCERIN (NITROSTAT) 0.4 MG SL tablet Place 1 tablet (0.4 mg total) under the tongue every 5 (five) minutes as needed. For chest pain  . potassium chloride SA (K-DUR,KLOR-CON) 20 MEQ tablet Take 1 tablet (20 mEq total) by mouth 2 (two) times daily.  . rosuvastatin (CRESTOR) 40 MG tablet Take 1 tablet (40 mg total) by mouth daily.  . vitamin B-12 (CYANOCOBALAMIN) 1000 MCG tablet Take 1,000 mcg by mouth daily.  . Vitamin D, Ergocalciferol, (DRISDOL) 50000 UNITS CAPS capsule Take 1 capsule (50,000 Units total) by mouth every 7 (seven) days.  Marland Kitchen zolpidem (AMBIEN) 10 MG tablet TAKE 1/2 TO 1 TABLET AT BEDTIME AS NEEDED   No facility-administered encounter medications on file as  of 07/08/2015.      Review of Systems  Constitutional: Negative.   HENT: Negative.   Eyes: Negative.   Respiratory: Negative.   Cardiovascular: Negative.   Gastrointestinal: Negative.   Endocrine: Negative.   Genitourinary: Negative.   Musculoskeletal: Negative.   Skin: Negative.   Allergic/Immunologic: Negative.   Neurological: Negative.   Hematological: Negative.   Psychiatric/Behavioral: Negative.        Objective:   Physical Exam    Constitutional: She is oriented to person, place, and time. She appears well-developed and well-nourished. No distress.  Patient is pleasant and alert and in good spirits today.  HENT:  Head: Normocephalic and atraumatic.  Right Ear: External ear normal.  Left Ear: External ear normal.  Nose: Nose normal.  Mouth/Throat: Oropharynx is clear and moist.  Eyes: Conjunctivae and EOM are normal. Pupils are equal, round, and reactive to light. Right eye exhibits no discharge. Left eye exhibits no discharge. No scleral icterus.  Neck: Normal range of motion. Neck supple. No thyromegaly present.  Without bruits or thyromegaly  Cardiovascular: Normal rate, regular rhythm, normal heart sounds and intact distal pulses.   No murmur heard. At 72/m  Pulmonary/Chest: Effort normal and breath sounds normal. She has no wheezes. She has no rales.  Abdominal: Soft. Bowel sounds are normal. She exhibits no mass. There is no tenderness. There is no rebound and no guarding.  Musculoskeletal: Normal range of motion. She exhibits no edema or tenderness.  Lymphadenopathy:    She has no cervical adenopathy.  Neurological: She is alert and oriented to person, place, and time. She has normal reflexes. No cranial nerve deficit.  Skin: Skin is warm and dry. No rash noted.  Psychiatric: She has a normal mood and affect. Her behavior is normal. Judgment and thought content normal.  Nursing note and vitals reviewed.  BP 123/77 mmHg  Pulse 84  Temp(Src) 97.4 F (36.3 C) (Oral)  Ht 5' 6"  (1.676 m)  Wt 183 lb (83.008 kg)  BMI 29.55 kg/m2  Results for orders placed or performed in visit on 07/08/15  POCT glycosylated hemoglobin (Hb A1C)  Result Value Ref Range   Hemoglobin A1C 8.8      The patient was informed of the above results and will be scheduled to come back and visit with the clinical pharmacist to make sure that we get this A1c back to a more safe level.    Assessment & Plan:  1. Uncontrolled type 2  diabetes mellitus with insulin therapy ([place]) -The patient needs to be on a regular regimen and unfortunately because of cost she did not take any insulin for a couple of weeks. She is now getting this supplied for the rest the [place] from the county. We will encourage her to follow-up regularly with the clinical pharmacist so that we can make sure that we get her A1c back to where it should be. - POCT glycosylated hemoglobin (Hb A1C) - CBC with Differential/Platelet  2. Essential hypertension, benign -Blood pressure is good today and she should continue with current treatment - BMP8+EGFR - Hepatic function panel - CBC with Differential/Platelet  3. Vitamin D deficiency -Continue with current treatment pending results of lab work - CBC with Differential/Platelet - Vit D  25 hydroxy (rtn osteoporosis monitoring)  4. Hyperlipemia -Continue with current treatment pending results of lab work - CBC with Differential/Platelet - NMR, lipoprofile  5. Gastroesophageal reflux disease, esophagitis presence not specified -The patient's GERD is well controlled as long as she is  taking her medication regularly. - Hepatic function panel - CBC with Differential/Platelet  Patient Instructions                       Medicare Annual Wellness Visit  [facility] and the medical providers at [facility] strive to bring you the best medical care.  In doing so we not only want to address your current medical conditions and concerns but also to detect new conditions early and prevent illness, disease and health-related problems.    Medicare offers a yearly Wellness Visit which allows our clinical staff to assess your need for preventative services including immunizations, lifestyle education, counseling to decrease risk of preventable diseases and screening for fall risk and other medical concerns.    This visit is provided free of charge (no copay) for all Medicare recipients. The  clinical pharmacists at [facility] have begun to conduct these Wellness Visits which will also include a thorough review of all your medications.    As you primary medical provider recommend that you make an appointment for your Annual Wellness Visit if you have not done so already this year.  You may set up this appointment before you leave today or you may call back ([phone]) and schedule an appointment.  Please make sure when you call that you mention that you are scheduling your Annual Wellness Visit with the clinical pharmacist so that the appointment may be made for the proper length of time.     Continue current medications. Continue good therapeutic lifestyle changes which include good diet and exercise. Fall precautions discussed with patient. If an FOBT was given today- please return it to our front desk. If you are over 82 years old - you may need Prevnar 27 or the adult Pneumonia vaccine.  **Flu shots are available --- please call and schedule a FLU-CLINIC appointment**  After your visit with [name] today you will receive a survey in the mail or online from [employer] regarding your care with [name]. Please take a moment to fill this out. Your feedback is very important to [name] as you can help [name] better understand your patient needs as well as improve your experience and satisfaction. WE CARE ABOUT YOU!!!    The patient should try to have a regular regimen for treating her diabetes--- she should continue to follow-up to try to get her medications and stay on these regularly if possible She should stay active physically and watch her diet closely She should be scheduled to get her pelvic exam and should continue to do this periodically   Arrie Senate MD

## 2015-07-08 NOTE — Patient Instructions (Addendum)
Medicare Annual Wellness Visit  [facility] and the medical providers at [facility] strive to bring you the best medical care.  In doing so we not only want to address your current medical conditions and concerns but also to detect new conditions early and prevent illness, disease and health-related problems.    Medicare offers a yearly Wellness Visit which allows our clinical staff to assess your need for preventative services including immunizations, lifestyle education, counseling to decrease risk of preventable diseases and screening for fall risk and other medical concerns.    This visit is provided free of charge (no copay) for all Medicare recipients. The clinical pharmacists at [facility] have begun to conduct these Wellness Visits which will also include a thorough review of all your medications.    As you primary medical provider recommend that you make an appointment for your Annual Wellness Visit if you have not done so already this year.  You may set up this appointment before you leave today or you may call back ([phone]) and schedule an appointment.  Please make sure when you call that you mention that you are scheduling your Annual Wellness Visit with the clinical pharmacist so that the appointment may be made for the proper length of time.     Continue current medications. Continue good therapeutic lifestyle changes which include good diet and exercise. Fall precautions discussed with patient. If an FOBT was given today- please return it to our front desk. If you are over 49 years old - you may need Prevnar 95 or the adult Pneumonia vaccine.  **Flu shots are available --- please call and schedule a FLU-CLINIC appointment**  After your visit with [name] today you will receive a survey in the mail or online from [employer] regarding your care with [name]. Please take a moment to fill this out. Your feedback is very  important to [name] as you can help [name] better understand your patient needs as well as improve your experience and satisfaction. WE CARE ABOUT YOU!!!    The patient should try to have a regular regimen for treating her diabetes--- she should continue to follow-up to try to get her medications and stay on these regularly if possible She should stay active physically and watch her diet closely She should be scheduled to get her pelvic exam and should continue to do this periodically

## 2015-07-12 LAB — NMR, LIPOPROFILE
CHOLESTEROL: 124 mg/dL (ref 100–199)
HDL Cholesterol by NMR: 32 mg/dL — ABNORMAL LOW (ref >39)
HDL Particle Number: 24.8 umol/L — ABNORMAL LOW (ref >=30.5)
LDL PARTICLE NUMBER: 1280 nmol/L — AB (ref <1000)
LDL SIZE: 20.3 nm (ref >20.5)
LDL-C: 65 mg/dL (ref 0–99)
LP-IR Score: 72 — ABNORMAL HIGH (ref <=45)
SMALL LDL PARTICLE NUMBER: 805 nmol/L — AB (ref <=527)
TRIGLYCERIDES BY NMR: 134 mg/dL (ref 0–149)

## 2015-07-12 LAB — CBC WITH DIFFERENTIAL/PLATELET
Basophils Absolute: 0 10*3/uL (ref 0.0–0.2)
Basos: 0 %
EOS (ABSOLUTE): 0.1 10*3/uL (ref 0.0–0.4)
Eos: 2 %
Hematocrit: 37.3 % (ref 34.0–46.6)
Hemoglobin: 12.6 g/dL (ref 11.1–15.9)
IMMATURE GRANULOCYTES: 0 %
Immature Grans (Abs): 0 10*3/uL (ref 0.0–0.1)
LYMPHS ABS: 2.2 10*3/uL (ref 0.7–3.1)
Lymphs: 33 %
MCH: 29.4 pg (ref 26.6–33.0)
MCHC: 33.8 g/dL (ref 31.5–35.7)
MCV: 87 fL (ref 79–97)
MONOS ABS: 0.4 10*3/uL (ref 0.1–0.9)
Monocytes: 6 %
NEUTROS PCT: 59 %
Neutrophils Absolute: 3.8 10*3/uL (ref 1.4–7.0)
PLATELETS: 208 10*3/uL (ref 150–379)
RBC: 4.28 x10E6/uL (ref 3.77–5.28)
RDW: 14.2 % (ref 12.3–15.4)
WBC: 6.5 10*3/uL (ref 3.4–10.8)

## 2015-07-12 LAB — BMP8+EGFR
BUN/Creatinine Ratio: 12 (ref 11–26)
BUN: 11 mg/dL (ref 8–27)
CALCIUM: 9 mg/dL (ref 8.7–10.3)
CHLORIDE: 99 mmol/L (ref 97–108)
CO2: 24 mmol/L (ref 18–29)
CREATININE: 0.94 mg/dL (ref 0.57–1.00)
GFR calc Af Amer: 73 mL/min/{1.73_m2} (ref >59)
GFR, EST NON AFRICAN AMERICAN: 63 mL/min/{1.73_m2} (ref >59)
Glucose: 297 mg/dL — ABNORMAL HIGH (ref 65–99)
POTASSIUM: 4.2 mmol/L (ref 3.5–5.2)
Sodium: 138 mmol/L (ref 134–144)

## 2015-07-12 LAB — HEPATIC FUNCTION PANEL
ALK PHOS: 54 IU/L (ref 39–117)
ALT: 16 IU/L (ref 0–32)
AST: 18 IU/L (ref 0–40)
Albumin: 3.8 g/dL (ref 3.6–4.8)
BILIRUBIN, DIRECT: 0.13 mg/dL (ref 0.00–0.40)
Bilirubin Total: 0.3 mg/dL (ref 0.0–1.2)
Total Protein: 6.4 g/dL (ref 6.0–8.5)

## 2015-07-12 LAB — VITAMIN D 25 HYDROXY (VIT D DEFICIENCY, FRACTURES): VIT D 25 HYDROXY: 29.1 ng/mL — AB (ref 30.0–100.0)

## 2015-07-25 LAB — HM MAMMOGRAPHY

## 2015-07-26 ENCOUNTER — Other Ambulatory Visit: Payer: Self-pay | Admitting: Family Medicine

## 2015-07-27 NOTE — Telephone Encounter (Signed)
Last seen 07/08/15 DWM  If approved route to nurse to call into the Drug store

## 2015-07-28 ENCOUNTER — Ambulatory Visit: Payer: Self-pay | Admitting: Pharmacist

## 2015-08-04 ENCOUNTER — Encounter: Payer: Self-pay | Admitting: Pharmacist

## 2015-08-04 ENCOUNTER — Ambulatory Visit (INDEPENDENT_AMBULATORY_CARE_PROVIDER_SITE_OTHER): Payer: Commercial Managed Care - HMO | Admitting: Pharmacist

## 2015-08-04 ENCOUNTER — Encounter (INDEPENDENT_AMBULATORY_CARE_PROVIDER_SITE_OTHER): Payer: Self-pay

## 2015-08-04 VITALS — BP 148/88 | HR 82 | Ht 66.0 in | Wt 184.0 lb

## 2015-08-04 DIAGNOSIS — E1165 Type 2 diabetes mellitus with hyperglycemia: Secondary | ICD-10-CM | POA: Diagnosis not present

## 2015-08-04 DIAGNOSIS — Z794 Long term (current) use of insulin: Secondary | ICD-10-CM | POA: Diagnosis not present

## 2015-08-04 DIAGNOSIS — I10 Essential (primary) hypertension: Secondary | ICD-10-CM | POA: Diagnosis not present

## 2015-08-04 DIAGNOSIS — E785 Hyperlipidemia, unspecified: Secondary | ICD-10-CM | POA: Diagnosis not present

## 2015-08-04 DIAGNOSIS — IMO0002 Reserved for concepts with insufficient information to code with codable children: Secondary | ICD-10-CM

## 2015-08-04 DIAGNOSIS — E559 Vitamin D deficiency, unspecified: Secondary | ICD-10-CM

## 2015-08-04 MED ORDER — LISINOPRIL 40 MG PO TABS: 40.0000 mg | ORAL_TABLET | Freq: Every day | ORAL | Status: DC

## 2015-08-04 MED ORDER — VITAMIN D (ERGOCALCIFEROL) 1.25 MG (50000 UNIT) PO CAPS: 50000.0000 [IU] | ORAL_CAPSULE | ORAL | Status: DC

## 2015-08-04 MED ORDER — DULAGLUTIDE 0.75 MG/0.5ML ~~LOC~~ SOAJ: 0.7500 mg | SUBCUTANEOUS | Status: DC

## 2015-08-04 NOTE — Progress Notes (Signed)
Diabetes Follow-Up Visit Chief Complaint:   Chief Complaint  Patient presents with  . Diabetes     Filed Vitals:   08/04/15 0946  BP: 148/88  Pulse: 82   Filed Weights   08/04/15 0946  Weight: 184 lb (83.462 kg)    HPI: Patient is here today to follow up uncontrolled type 2 diabetes.   Patient had an insulin pump for about 1 year but in July of 2015 her insurance stopped paying for pump supplies and patient was switched insulin pens.   She is also currently using Levemir 35 units twice daily and Humalog 30 to 35 units prior to meals to control BG.  She was taking Invokana 300mg  1 tablet daily but she stopped because she was concerned about side effects she saw on TV.  Has not taken in 3 weeks.  Patient has tried metformin regular and XR and very low dose but has not been able to tolerate in the past due to diarrhea and nausea.   Home BG Monitoring:  Checking up to 3 times a day. Patient did not bring in glucometer today but reported HBG readings per below  High: 245  Low:  127   Patient has keto sticks at home and tests when BG greater than 400.  She has not had any positive urine ketone readings.  HTN - patient has stopped lisinopril.  She did not have a reason except that she was waiting until all her medications were due to order from [facility] mail order. Patient also reports SOB at night which she thinks might be related to a cold.  She has CHF.  She usually sleeps on 3 pillows.  Exam Edema:  negative  Polyuria:  Negative Polydipsia:  Negative             Polyphagia:  negative  BMI:  Body mass index is 29.71 kg/(m^2).   Edema - trace bilaterally  Weight changes:  stable General Appearance:  alert, oriented, no acute distress and obese Mood/Affect:  normal   Low fat/carbohydrate diet?  Yes - continues to limit CHO portions sizes  Nicotine Abuse?  No Medication Compliance?  Yes Exercise?  No Alcohol Abuse?  No   Lab Results  Component Value Date   HGBA1C 8.8  07/08/2015        HGBA1C               10.2% (04/06/2015)  Lipids panel from 07/08/2015 LDL = 65 Tg = 134 Total choletserol = 124 HDL = 32 LDL-P = 1280   Assessment: 1.  Diabetes.  Uncontrolled but improving per HBG 2.  Blood Pressure.  Elevated today due to not taking lisinopril 3.  Dyslipidemia - LDL and triglyerides at goal and HDL low. Patient on statin. 4.  Low vitamin D - per Dr Tawanna Sat notes from last labs she would like patient to continue vitamin D 50,000IU weekly (she current is out and needs Rx) 5.  Obesity - currently weight is stable   Recommendations: 1.  Medication recommendations at this time are as follows:    Continue Levemir to 35 units BID  Continue Humalog 30 units tid prior to meal and 10 units as needed for BG greater than 300.  Patient to call if gets more than 1 BG reading greater than 300 for insulin adjustment  Start Trulicity 0.75mg  SQ weekly - patient given #2 samples.  First dose administered in office today with instruction.    Stop Invokana   Restart lisinopril 40mg   daily - Rx sent to local pharmacy  Take furosemide 40mg  bid for next 5 days.    Continue  Crestor 40mg  1 tablet daily (given coupon for #30 free crestor)    Restart vitamin D 50,000 IU weekly - Rx sent to local pharmacy   2.  Revewed HBG goals:  Fasting 80-130 and 1-2 hour post prandial <180.  Patient is instructed to check BG 3 to 5  times per day.   3.  BP goal < 140/80.  4.  Patient to check weight daily - if gains 2 or more lbs in 24 hours she is to call office.  Also she is to call if SOB worsens or dose to resolve by Monday 08/07/2015. 5.  Reviewed low CHO diet and recommended increased physical activity.  Suggested that she consider [facility] program which is very close to her home. 7.  Return to clinic in 4 weeks   Time spent counseling patient:  40 minutes   Cherre Robins, PharmD, CPP, CDE

## 2015-08-04 NOTE — Patient Instructions (Signed)
Start Trulicity 0.75mg  - inject once syringeful once weekly Restart lisinopril 40mg  1 tablet daily  Restart vitamin D 50,000IU once weekly   Check weight daily over next week - if more than 2 lbs. Increase in 24 hours call office.  Take extra furosemide daily for next 5 days.  If shortness of breath is not better or if gets worse over the next 5 days call office for appointment.  DASH Eating Plan DASH stands for "Dietary Approaches to Stop Hypertension." The DASH eating plan is a healthy eating plan that has been shown to reduce high blood pressure (hypertension). Additional health benefits may include reducing the risk of type 2 diabetes mellitus, heart disease, and stroke. The DASH eating plan may also help with weight loss. WHAT DO I NEED TO KNOW ABOUT THE DASH EATING PLAN? For the DASH eating plan, you will follow these general guidelines:  Choose foods with a percent daily value for sodium of less than 5% (as listed on the food label).  Use salt-free seasonings or herbs instead of table salt or sea salt.  Check with your health care provider or pharmacist before using salt substitutes.  Eat lower-sodium products, often labeled as "lower sodium" or "no salt added."  Eat fresh foods.  Eat more vegetables, fruits, and low-fat dairy products.  Choose whole grains. Look for the word "whole" as the first word in the ingredient list.  Choose fish and skinless chicken or [place] more often than red meat. Limit fish, poultry, and meat to 6 oz (170 g) each day.  Limit sweets, desserts, sugars, and sugary drinks.  Choose heart-healthy fats.  Limit cheese to 1 oz (28 g) per day.  Eat more home-cooked food and less restaurant, buffet, and fast food.  Limit fried foods.  Cook foods using methods other than frying.  Limit canned vegetables. If you do use them, rinse them well to decrease the sodium.  When eating at a restaurant, ask that your food be prepared with less salt, or no  salt if possible. WHAT FOODS CAN I EAT? Seek help from a dietitian for individual calorie needs. Grains Whole grain or whole wheat bread. [place] rice. Whole grain or whole wheat pasta. Quinoa, bulgur, and whole grain cereals. Low-sodium cereals. Corn or whole wheat flour tortillas. Whole grain cornbread. Whole grain crackers. Low-sodium crackers. Vegetables Fresh or frozen vegetables (raw, steamed, roasted, or grilled). Low-sodium or reduced-sodium tomato and vegetable juices. Low-sodium or reduced-sodium tomato sauce and paste. Low-sodium or reduced-sodium canned vegetables.  Fruits All fresh, canned (in natural juice), or frozen fruits. Meat and Other Protein Products Ground beef (85% or leaner), grass-fed beef, or beef trimmed of fat. Skinless chicken or [place]. Ground chicken or [place]. Pork trimmed of fat. All fish and seafood. Eggs. Dried beans, peas, or lentils. Unsalted nuts and seeds. Unsalted canned beans. Dairy Low-fat dairy products, such as skim or 1% milk, 2% or reduced-fat cheeses, low-fat ricotta or cottage cheese, or plain low-fat yogurt. Low-sodium or reduced-sodium cheeses. Fats and Oils Tub margarines without trans fats. Light or reduced-fat mayonnaise and salad dressings (reduced sodium). Avocado. Safflower, olive, or canola oils. Natural peanut or almond butter. Other Unsalted popcorn and pretzels. The items listed above may not be a complete list of recommended foods or beverages. Contact your dietitian for more options. WHAT FOODS ARE NOT RECOMMENDED? Grains White bread. White pasta. White rice. Refined cornbread. Bagels and croissants. Crackers that contain trans fat. Vegetables Creamed or fried vegetables. Vegetables in a cheese sauce. Regular  canned vegetables. Regular canned tomato sauce and paste. Regular tomato and vegetable juices. Fruits Dried fruits. Canned fruit in light or heavy syrup. Fruit juice. Meat and Other Protein Products Fatty cuts of meat. Ribs,  chicken wings, bacon, sausage, bologna, salami, chitterlings, fatback, hot dogs, bratwurst, and packaged luncheon meats. Salted nuts and seeds. Canned beans with salt. Dairy Whole or 2% milk, cream, half-and-half, and cream cheese. Whole-fat or sweetened yogurt. Full-fat cheeses or blue cheese. Nondairy creamers and whipped toppings. Processed cheese, cheese spreads, or cheese curds. Condiments Onion and garlic salt, seasoned salt, table salt, and sea salt. Canned and packaged gravies. Worcestershire sauce. Tartar sauce. Barbecue sauce. Teriyaki sauce. Soy sauce, including reduced sodium. Steak sauce. Fish sauce. Oyster sauce. Cocktail sauce. Horseradish. Ketchup and mustard. Meat flavorings and tenderizers. Bouillon cubes. Hot sauce. Tabasco sauce. Marinades. Taco seasonings. Relishes. Fats and Oils Butter, stick margarine, lard, shortening, ghee, and bacon fat. Coconut, palm kernel, or palm oils. Regular salad dressings. Other Pickles and olives. Salted popcorn and pretzels. The items listed above may not be a complete list of foods and beverages to avoid. Contact your dietitian for more information. WHERE CAN I FIND MORE INFORMATION? National Heart, Lung, and Blood Institute: travelstabloid.com   This information is not intended to replace advice given to you by your health care provider. Make sure you discuss any questions you have with your health care provider.   Document Released: 08/30/2011 Document Revised: 10/01/2014 Document Reviewed: 07/15/2013 Elsevier Interactive Patient Education [employer].

## 2015-08-08 ENCOUNTER — Encounter: Payer: Self-pay | Admitting: *Deleted

## 2015-08-09 ENCOUNTER — Telehealth: Payer: Self-pay | Admitting: Family Medicine

## 2015-08-09 MED ORDER — AMOXICILLIN 500 MG PO CAPS: 500.0000 mg | ORAL_CAPSULE | Freq: Three times a day (TID) | ORAL | Status: DC

## 2015-08-09 NOTE — Telephone Encounter (Signed)
Please call in amoxicillin 503 times a day for 10 days

## 2015-08-16 ENCOUNTER — Telehealth: Payer: Self-pay | Admitting: *Deleted

## 2015-08-16 NOTE — Telephone Encounter (Signed)
Left message for patient to return my call. She needs a diabetic eye exam this year if she hasn't had one. If she has had one I need a copy of the report.  She also needs to complete an FOBT. Last one was 09/08/15.

## 2015-09-01 ENCOUNTER — Inpatient Hospital Stay (HOSPITAL_COMMUNITY)
Admission: EM | Admit: 2015-09-01 | Discharge: 2015-09-02 | DRG: 880 | Disposition: A | Discharge status: Home / Self Care | Payer: Commercial Managed Care - HMO | Attending: Internal Medicine | Admitting: Internal Medicine

## 2015-09-01 ENCOUNTER — Emergency Department (HOSPITAL_COMMUNITY): Payer: Commercial Managed Care - HMO

## 2015-09-01 ENCOUNTER — Encounter (HOSPITAL_COMMUNITY): Payer: Self-pay

## 2015-09-01 ENCOUNTER — Inpatient Hospital Stay (HOSPITAL_COMMUNITY): Payer: Commercial Managed Care - HMO

## 2015-09-01 DIAGNOSIS — Z9842 Cataract extraction status, left eye: Secondary | ICD-10-CM

## 2015-09-01 DIAGNOSIS — I25110 Atherosclerotic heart disease of native coronary artery with unstable angina pectoris: Secondary | ICD-10-CM | POA: Diagnosis present

## 2015-09-01 DIAGNOSIS — R4781 Slurred speech: Secondary | ICD-10-CM | POA: Diagnosis present

## 2015-09-01 DIAGNOSIS — F410 Panic disorder [episodic paroxysmal anxiety] without agoraphobia: Principal | ICD-10-CM | POA: Diagnosis present

## 2015-09-01 DIAGNOSIS — G35 Multiple sclerosis: Secondary | ICD-10-CM | POA: Diagnosis present

## 2015-09-01 DIAGNOSIS — I25119 Atherosclerotic heart disease of native coronary artery with unspecified angina pectoris: Secondary | ICD-10-CM | POA: Diagnosis present

## 2015-09-01 DIAGNOSIS — E785 Hyperlipidemia, unspecified: Secondary | ICD-10-CM | POA: Diagnosis present

## 2015-09-01 DIAGNOSIS — Z9841 Cataract extraction status, right eye: Secondary | ICD-10-CM

## 2015-09-01 DIAGNOSIS — R29810 Facial weakness: Secondary | ICD-10-CM | POA: Diagnosis present

## 2015-09-01 DIAGNOSIS — I639 Cerebral infarction, unspecified: Secondary | ICD-10-CM

## 2015-09-01 DIAGNOSIS — E782 Mixed hyperlipidemia: Secondary | ICD-10-CM | POA: Diagnosis present

## 2015-09-01 DIAGNOSIS — I252 Old myocardial infarction: Secondary | ICD-10-CM | POA: Diagnosis not present

## 2015-09-01 DIAGNOSIS — R471 Dysarthria and anarthria: Secondary | ICD-10-CM

## 2015-09-01 DIAGNOSIS — Z7902 Long term (current) use of antithrombotics/antiplatelets: Secondary | ICD-10-CM | POA: Diagnosis not present

## 2015-09-01 DIAGNOSIS — E1169 Type 2 diabetes mellitus with other specified complication: Secondary | ICD-10-CM | POA: Diagnosis present

## 2015-09-01 DIAGNOSIS — I2510 Atherosclerotic heart disease of native coronary artery without angina pectoris: Secondary | ICD-10-CM | POA: Diagnosis present

## 2015-09-01 DIAGNOSIS — F419 Anxiety disorder, unspecified: Secondary | ICD-10-CM | POA: Diagnosis present

## 2015-09-01 DIAGNOSIS — I6523 Occlusion and stenosis of bilateral carotid arteries: Secondary | ICD-10-CM | POA: Diagnosis present

## 2015-09-01 DIAGNOSIS — Z955 Presence of coronary angioplasty implant and graft: Secondary | ICD-10-CM

## 2015-09-01 DIAGNOSIS — Z794 Long term (current) use of insulin: Secondary | ICD-10-CM | POA: Diagnosis not present

## 2015-09-01 DIAGNOSIS — Z7982 Long term (current) use of aspirin: Secondary | ICD-10-CM | POA: Diagnosis not present

## 2015-09-01 DIAGNOSIS — I10 Essential (primary) hypertension: Secondary | ICD-10-CM | POA: Diagnosis present

## 2015-09-01 DIAGNOSIS — Z79899 Other long term (current) drug therapy: Secondary | ICD-10-CM | POA: Diagnosis not present

## 2015-09-01 DIAGNOSIS — R4701 Aphasia: Secondary | ICD-10-CM | POA: Diagnosis present

## 2015-09-01 DIAGNOSIS — K219 Gastro-esophageal reflux disease without esophagitis: Secondary | ICD-10-CM | POA: Diagnosis present

## 2015-09-01 DIAGNOSIS — E1159 Type 2 diabetes mellitus with other circulatory complications: Secondary | ICD-10-CM | POA: Diagnosis present

## 2015-09-01 DIAGNOSIS — I509 Heart failure, unspecified: Secondary | ICD-10-CM | POA: Diagnosis present

## 2015-09-01 DIAGNOSIS — R200 Anesthesia of skin: Secondary | ICD-10-CM | POA: Diagnosis present

## 2015-09-01 DIAGNOSIS — Z8673 Personal history of transient ischemic attack (TIA), and cerebral infarction without residual deficits: Secondary | ICD-10-CM | POA: Diagnosis present

## 2015-09-01 DIAGNOSIS — I739 Peripheral vascular disease, unspecified: Secondary | ICD-10-CM | POA: Diagnosis present

## 2015-09-01 DIAGNOSIS — E1165 Type 2 diabetes mellitus with hyperglycemia: Secondary | ICD-10-CM | POA: Diagnosis not present

## 2015-09-01 DIAGNOSIS — F411 Generalized anxiety disorder: Secondary | ICD-10-CM | POA: Insufficient documentation

## 2015-09-01 DIAGNOSIS — I110 Hypertensive heart disease with heart failure: Secondary | ICD-10-CM | POA: Diagnosis present

## 2015-09-01 DIAGNOSIS — F329 Major depressive disorder, single episode, unspecified: Secondary | ICD-10-CM | POA: Diagnosis present

## 2015-09-01 DIAGNOSIS — G459 Transient cerebral ischemic attack, unspecified: Secondary | ICD-10-CM

## 2015-09-01 DIAGNOSIS — I6789 Other cerebrovascular disease: Secondary | ICD-10-CM | POA: Diagnosis not present

## 2015-09-01 LAB — DIFFERENTIAL
Basophils Absolute: 0 10*3/uL (ref 0.0–0.1)
Basophils Relative: 0 %
Eosinophils Absolute: 0.1 10*3/uL (ref 0.0–0.7)
Eosinophils Relative: 2 %
LYMPHS ABS: 2.3 10*3/uL (ref 0.7–4.0)
LYMPHS PCT: 36 %
MONO ABS: 0.3 10*3/uL (ref 0.1–1.0)
MONOS PCT: 5 %
NEUTROS ABS: 3.6 10*3/uL (ref 1.7–7.7)
Neutrophils Relative %: 57 %

## 2015-09-01 LAB — I-STAT CHEM 8, ED
BUN: 13 mg/dL (ref 6–20)
CALCIUM ION: 1.04 mmol/L — AB (ref 1.13–1.30)
CHLORIDE: 88 mmol/L — AB (ref 101–111)
CREATININE: 0.8 mg/dL (ref 0.44–1.00)
Glucose, Bld: 587 mg/dL (ref 65–99)
HCT: 45 % (ref 36.0–46.0)
Hemoglobin: 15.3 g/dL — ABNORMAL HIGH (ref 12.0–15.0)
Potassium: 3.5 mmol/L (ref 3.5–5.1)
Sodium: 134 mmol/L — ABNORMAL LOW (ref 135–145)
TCO2: 32 mmol/L (ref 0–100)

## 2015-09-01 LAB — COMPREHENSIVE METABOLIC PANEL
ALK PHOS: 57 U/L (ref 38–126)
ALT: 25 U/L (ref 14–54)
AST: 31 U/L (ref 15–41)
Albumin: 3.5 g/dL (ref 3.5–5.0)
Anion gap: 14 (ref 5–15)
BILIRUBIN TOTAL: 0.8 mg/dL (ref 0.3–1.2)
BUN: 11 mg/dL (ref 6–20)
CALCIUM: 9.3 mg/dL (ref 8.9–10.3)
CO2: 28 mmol/L (ref 22–32)
CREATININE: 0.99 mg/dL (ref 0.44–1.00)
Chloride: 91 mmol/L — ABNORMAL LOW (ref 101–111)
GFR, EST NON AFRICAN AMERICAN: 58 mL/min — AB (ref >60)
Glucose, Bld: 576 mg/dL (ref 65–99)
Potassium: 3.6 mmol/L (ref 3.5–5.1)
Sodium: 133 mmol/L — ABNORMAL LOW (ref 135–145)
Total Protein: 6.9 g/dL (ref 6.5–8.1)

## 2015-09-01 LAB — I-STAT TROPONIN, ED: TROPONIN I, POC: 0 ng/mL (ref 0.00–0.08)

## 2015-09-01 LAB — CBC
HEMATOCRIT: 42.1 % (ref 36.0–46.0)
HEMOGLOBIN: 14.6 g/dL (ref 12.0–15.0)
MCH: 30.2 pg (ref 26.0–34.0)
MCHC: 34.7 g/dL (ref 30.0–36.0)
MCV: 87 fL (ref 78.0–100.0)
Platelets: 140 10*3/uL — ABNORMAL LOW (ref 150–400)
RBC: 4.84 MIL/uL (ref 3.87–5.11)
RDW: 13.9 % (ref 11.5–15.5)
WBC: 6.3 10*3/uL (ref 4.0–10.5)

## 2015-09-01 LAB — PROTIME-INR
INR: 1.12 (ref 0.00–1.49)
Prothrombin Time: 14.5 seconds (ref 11.6–15.2)

## 2015-09-01 LAB — GLUCOSE, CAPILLARY: GLUCOSE-CAPILLARY: 386 mg/dL — AB (ref 65–99)

## 2015-09-01 LAB — CBG MONITORING, ED
Glucose-Capillary: 540 mg/dL — ABNORMAL HIGH (ref 65–99)
Glucose-Capillary: 468 mg/dL — ABNORMAL HIGH (ref 65–99)

## 2015-09-01 LAB — APTT: aPTT: 23 seconds — ABNORMAL LOW (ref 24–37)

## 2015-09-01 MED ORDER — ASPIRIN EC 325 MG PO TBEC
325.0000 mg | DELAYED_RELEASE_TABLET | Freq: Every day | ORAL | Status: DC
  Administered 2015-09-01 – 2015-09-02 (×2): 325 mg via ORAL
  Filled 2015-09-01 (×2): qty 1

## 2015-09-01 MED ORDER — ENOXAPARIN SODIUM 30 MG/0.3ML ~~LOC~~ SOLN
30.0000 mg | SUBCUTANEOUS | Status: DC
  Administered 2015-09-01: 30 mg via SUBCUTANEOUS
  Filled 2015-09-01: qty 0.3

## 2015-09-01 MED ORDER — ROSUVASTATIN CALCIUM 40 MG PO TABS
40.0000 mg | ORAL_TABLET | Freq: Every day | ORAL | Status: DC
  Administered 2015-09-01: 40 mg via ORAL
  Filled 2015-09-01 (×3): qty 1

## 2015-09-01 MED ORDER — LORAZEPAM 2 MG/ML IJ SOLN
INTRAMUSCULAR | Status: AC
  Administered 2015-09-01: 1 mg via INTRAVENOUS
  Filled 2015-09-01: qty 1

## 2015-09-01 MED ORDER — LORAZEPAM 2 MG/ML IJ SOLN
1.0000 mg | Freq: Once | INTRAMUSCULAR | Status: AC
  Administered 2015-09-01: 1 mg via INTRAVENOUS

## 2015-09-01 MED ORDER — INSULIN DETEMIR 100 UNIT/ML FLEXPEN: 25.0000 [IU] | PEN_INJECTOR | Freq: Two times a day (BID) | SUBCUTANEOUS | Status: DC

## 2015-09-01 MED ORDER — INSULIN DETEMIR 100 UNIT/ML ~~LOC~~ SOLN
25.0000 [IU] | Freq: Two times a day (BID) | SUBCUTANEOUS | Status: DC
  Administered 2015-09-01 – 2015-09-02 (×2): 25 [IU] via SUBCUTANEOUS
  Filled 2015-09-01 (×3): qty 0.25

## 2015-09-01 MED ORDER — NITROGLYCERIN 0.4 MG SL SUBL: 0.4000 mg | SUBLINGUAL_TABLET | SUBLINGUAL | Status: DC | PRN

## 2015-09-01 MED ORDER — INSULIN ASPART 100 UNIT/ML ~~LOC~~ SOLN
12.0000 [IU] | Freq: Once | SUBCUTANEOUS | Status: AC
  Administered 2015-09-01: 12 [IU] via INTRAVENOUS
  Filled 2015-09-01: qty 1

## 2015-09-01 MED ORDER — SODIUM CHLORIDE 0.9 % IV BOLUS (SEPSIS)
500.0000 mL | Freq: Once | INTRAVENOUS | Status: AC
  Administered 2015-09-01: 500 mL via INTRAVENOUS

## 2015-09-01 MED ORDER — CLOPIDOGREL BISULFATE 75 MG PO TABS
75.0000 mg | ORAL_TABLET | Freq: Every day | ORAL | Status: DC
  Administered 2015-09-01 – 2015-09-02 (×2): 75 mg via ORAL
  Filled 2015-09-01 (×2): qty 1

## 2015-09-01 MED ORDER — VITAMIN D (ERGOCALCIFEROL) 1.25 MG (50000 UNIT) PO CAPS
50000.0000 [IU] | ORAL_CAPSULE | ORAL | Status: DC
Start: 2015-09-07 — End: 2015-09-02

## 2015-09-01 MED ORDER — HYDRALAZINE HCL 20 MG/ML IJ SOLN
10.0000 mg | Freq: Three times a day (TID) | INTRAMUSCULAR | Status: DC | PRN
Start: 2015-09-01 — End: 2015-09-02

## 2015-09-01 MED ORDER — SENNOSIDES-DOCUSATE SODIUM 8.6-50 MG PO TABS: 1.0000 | ORAL_TABLET | Freq: Every evening | ORAL | Status: DC | PRN

## 2015-09-01 MED ORDER — STROKE: EARLY STAGES OF RECOVERY BOOK
Freq: Once | Status: AC
  Administered 2015-09-01: 21:00:00

## 2015-09-01 MED ORDER — INSULIN ASPART 100 UNIT/ML ~~LOC~~ SOLN
0.0000 [IU] | Freq: Three times a day (TID) | SUBCUTANEOUS | Status: DC
  Administered 2015-09-02: 11 [IU] via SUBCUTANEOUS
  Administered 2015-09-02: 8 [IU] via SUBCUTANEOUS

## 2015-09-01 MED ORDER — CITALOPRAM HYDROBROMIDE 10 MG PO TABS
20.0000 mg | ORAL_TABLET | Freq: Every day | ORAL | Status: DC
  Administered 2015-09-02: 20 mg via ORAL
  Filled 2015-09-01: qty 2

## 2015-09-01 MED ORDER — SODIUM CHLORIDE 0.9 % IV SOLN
INTRAVENOUS | Status: DC
  Administered 2015-09-01: 12:00:00 via INTRAVENOUS

## 2015-09-01 MED ORDER — ALPRAZOLAM 0.5 MG PO TABS
0.5000 mg | ORAL_TABLET | Freq: Three times a day (TID) | ORAL | Status: DC | PRN
  Administered 2015-09-01: 0.5 mg via ORAL
  Filled 2015-09-01: qty 1

## 2015-09-01 NOTE — ED Provider Notes (Signed)
CSN: [id number]     Arrival date & time 09/01/15  1053 History   First MD Initiated Contact with Patient 09/01/15 1053     Chief Complaint  Patient presents with  . Code Stroke     (Consider location/radiation/quality/duration/timing/severity/associated sxs/prior Treatment) The history is provided by the patient and medical records.    64 year old female with history of MI, depression, coronary artery disease, GERD, and STEMI, congestive heart failure, history of CVA last year and TIAs, presenting to the ED as a code stroke. Patient states around 0945 this morning she was on the phone with her friend talking about cakes when she began to have difficulty finding her words and her speech sounded garbled.  States shortly afterwards she developed a left sided headache, constant in nature, with numbness of left arm and face.  No weakness.  No hx of migraines.  Patient is on ASA and plavix from prior stroke.  States last years stroke presented with right sided weakness.  She has no residual deficits from this stroke.    Past Medical History  Diagnosis Date  . Diverticulitis, colon   . MS (multiple sclerosis) ([place])     Not confirmed  . Depression   . PVD (peripheral vascular disease) ([place])   . HLD (hyperlipidemia)   . Essential hypertension, benign   . CAD (coronary artery disease)     DES to circumflex 02/2007, BMS to LAD and PTCA diagonal 03/2007  . Carotid artery plaque     Mild  . GERD (gastroesophageal reflux disease)   . NSTEMI (non-ST elevated myocardial infarction) ([place])     02/2007  . Prolapse of uterus   . TIA (transient ischemic attack) 1980's  . PAT (paroxysmal atrial tachycardia) ([place])   . Anxiety   . CHF (congestive heart failure) ([place])   . Anginal pain ([place])   . Pneumonia 1980's    "once"  . IDDM (insulin dependent diabetes mellitus) ([place])   . H/O hiatal hernia   . Migraine     "used to have them really bad; don't have them anymore" (01/07/2014)  . Elevated d-dimer  01/08/2014  . Cataract    Past Surgical History  Procedure Laterality Date  . Appendectomy  ~ 1970  . Cholecystectomy  ?1987  . Breast biopsy Right 1980's  . Colonoscopy  2002    Dr. Anwar--> Severe diverticular changes in the region of the sigmoid and descending colon with scattered diverticular changes throughout the rest of the colon. No polyps, ulcerations. Despite numerous manipulations, the tip of the scope could not be tipped into the cecal area.  . Colonoscopy  01/10/2012    Procedure: COLONOSCOPY;  Surgeon: Daneil Dolin, MD;  Location: [facility];  Service: Endoscopy;  Laterality: N/A;  1:55  . Coronary angioplasty with stent placement  ~ 1997 X 2    "2 + 1"  . Cardiac catheterization  01/07/2014  . Abdominal hysterectomy  1986    ovaries remain - prolaspe uterus   . Breast lumpectomy Right 1980's    Dr. Charlynne Pander   . Eye surgery Bilateral 2014    cataract  . Left heart catheterization with coronary angiogram N/A 01/07/2014    Procedure: LEFT HEART CATHETERIZATION WITH CORONARY ANGIOGRAM;  Surgeon: Larey Dresser, MD;  Location: [facility] CATH LAB;  Service: Cardiovascular;  Laterality: N/A;   Family History  Problem Relation Age of Onset  . Heart attack Mother 69  . Diabetes Father   . Heart attack Father 34  .  Heart attack Brother 30    x6  . Colon cancer Paternal Aunt     70s, died with brain anuerysm  . Crohn's disease Cousin     paternal   Social History  Substance Use Topics  . Smoking status: Passive Smoke Exposure - Never Smoker  . Smokeless tobacco: Never Used     Comment: spouse, 32 years - husband has quit 01/2011  . Alcohol Use: No   OB History    No data available     Review of Systems  Neurological: Positive for speech difficulty and headaches.  All other systems reviewed and are negative.     Allergies  Iohexol; Ticlid; and Codeine  Home Medications   Prior to Admission medications   Medication Sig Start Date End Date Taking? Authorizing  Provider  ALPRAZolam Duanne Moron) 0.5 MG tablet TAKE ONE TABLET 3 TIMES A DAY AS NEEDED. 07/27/15   Chipper Herb, MD  amoxicillin (AMOXIL) 500 MG capsule Take 1 capsule (500 mg total) by mouth 3 (three) times daily. For 10 days 08/09/15   Chipper Herb, MD  aspirin EC 325 MG tablet Take 325 mg by mouth daily.    Historical Provider, MD  atenolol (TENORMIN) 50 MG tablet Take 1 tablet (50 mg total) by mouth 2 (two) times daily. 04/26/14   Chipper Herb, MD  citalopram (CELEXA) 20 MG tablet Take 1 tablet (20 mg total) by mouth daily. 03/08/14   Chipper Herb, MD  clopidogrel (PLAVIX) 75 MG tablet Take 1 tablet (75 mg total) by mouth daily. 04/26/14   Chipper Herb, MD  Dulaglutide (TRULICITY) A999333 0000000 SOPN Inject 0.75 mg into the skin once a week. 08/04/15   Tammy Eckard, PHARMD  furosemide (LASIX) 40 MG tablet Take 1 tablet (40 mg total) by mouth 2 (two) times daily. 04/26/14   Chipper Herb, MD  Insulin Detemir (LEVEMIR FLEXTOUCH) 100 UNIT/ML Pen Inject 35 Units into the skin 2 (two) times daily. 12/01/14   Tammy Eckard, PHARMD  Insulin Glargine (LANTUS SOLOSTAR) 100 UNIT/ML Solostar Pen Inject 70 Units into the skin daily. Use in place of Levemir or Toujeo Patient not taking: Reported on 08/04/2015 04/06/15   Tammy Eckard, PHARMD  Insulin Glargine (TOUJEO SOLOSTAR) 300 UNIT/ML SOPN Inject 70 Units into the skin daily. Use in place of Levemir until able to get through patient assistance. Patient not taking: Reported on 08/04/2015 05/09/15   Tammy Eckard, PHARMD  insulin lispro (HUMALOG KWIKPEN) 100 UNIT/ML KiwkPen Inject up 35 units TID PRN 09/27/14   Mary-Margaret Hassell Done, FNP  isosorbide mononitrate (IMDUR) 120 MG 24 hr tablet Take 1 tablet (120 mg total) by mouth daily. 03/25/15   Tammy Eckard, PHARMD  lisinopril (PRINIVIL,ZESTRIL) 40 MG tablet Take 1 tablet (40 mg total) by mouth daily. 08/04/15   Tammy Eckard, PHARMD  nitroGLYCERIN (NITROSTAT) 0.4 MG SL tablet Place 1 tablet (0.4 mg total) under the  tongue every 5 (five) minutes as needed. For chest pain 06/11/13   Chipper Herb, MD  potassium chloride SA (K-DUR,KLOR-CON) 20 MEQ tablet Take 1 tablet (20 mEq total) by mouth 2 (two) times daily. 10/25/14   Tammy Eckard, PHARMD  rosuvastatin (CRESTOR) 40 MG tablet Take 1 tablet (40 mg total) by mouth daily. 06/11/13   Chipper Herb, MD  vitamin B-12 (CYANOCOBALAMIN) 1000 MCG tablet Take 1,000 mcg by mouth daily.    Historical Provider, MD  Vitamin D, Ergocalciferol, (DRISDOL) 50000 UNITS CAPS capsule Take 1 capsule (50,000 Units total)  by mouth every 7 (seven) days. 08/04/15   Tammy Eckard, PHARMD  zolpidem (AMBIEN) 10 MG tablet TAKE 1/2 TO 1 TABLET AT BEDTIME AS NEEDED 03/25/15   Mary-Margaret Hassell Done, FNP   BP 188/78 mmHg  Pulse 94  Temp(Src) 99.2 F (37.3 C) (Oral)  Resp 18  Ht 5\' 4"  (1.626 m)  Wt 82.1 kg  BMI 31.05 kg/m2  SpO2 94%   Physical Exam  Constitutional: She is oriented to person, place, and time. She appears well-developed and well-nourished. No distress.  HENT:  Head: Normocephalic and atraumatic.  Mouth/Throat: Oropharynx is clear and moist.  Eyes: Conjunctivae and EOM are normal. Pupils are equal, round, and reactive to light.  Neck: Normal range of motion. Neck supple.  Cardiovascular: Normal rate, regular rhythm and normal heart sounds.   Pulmonary/Chest: Effort normal and breath sounds normal. No respiratory distress. She has no wheezes.  Abdominal: Soft. Bowel sounds are normal. There is no tenderness. There is no guarding.  Musculoskeletal: Normal range of motion. She exhibits no edema.  Neurological: She is alert and oriented to person, place, and time.  AAOx3, answering questions appropriately, slight dysarthria noted; normal strength and sensation of all 4 extremities; normal sensation of face, slight left facial asymmetry noted of left nasolabial fold; normal sensation of face; normal heel to shin bilaterally; no pronator drift  Skin: Skin is warm and dry. She is  not diaphoretic.  Psychiatric: She has a normal mood and affect.  Nursing note and vitals reviewed.   ED Course  Procedures (including critical care time) Labs Review Labs Reviewed  APTT - Abnormal; Notable for the following:    aPTT 23 (*)    All other components within normal limits  CBC - Abnormal; Notable for the following:    Platelets 140 (*)    All other components within normal limits  CBG MONITORING, ED - Abnormal; Notable for the following:    Glucose-Capillary 540 (*)    All other components within normal limits  I-STAT CHEM 8, ED - Abnormal; Notable for the following:    Sodium 134 (*)    Chloride 88 (*)    Glucose, Bld 587 (*)    Calcium, Ion 1.04 (*)    Hemoglobin 15.3 (*)    All other components within normal limits  PROTIME-INR  DIFFERENTIAL  COMPREHENSIVE METABOLIC PANEL  I-STAT TROPOININ, ED    Imaging Review Ct Head Wo Contrast  09/01/2015  CLINICAL DATA:  Speech difficulty, code stroke EXAM: CT HEAD WITHOUT CONTRAST TECHNIQUE: Contiguous axial images were obtained from the base of the skull through the vertex without intravenous contrast. COMPARISON:  08/14/2014 FINDINGS: No skull fracture is noted. Paranasal sinuses and mastoid air cells are unremarkable. No intracranial hemorrhage, mass effect or midline shift. Mild cerebral atrophy is stable. No definite acute cortical infarction. Ventricular size is stable from prior exam. No mass lesion is noted on this unenhanced scan. IMPRESSION: No acute intracranial abnormality. Mild cerebral atrophy. No definite acute cortical infarction. These results were called by telephone at the time of interpretation on 09/01/2015 at 11:08 am to Dr. Nicole Kindred, who verbally acknowledged these results. Electronically Signed   By: Lahoma Crocker M.D.   On: 09/01/2015 11:09   I have personally reviewed and evaluated these images and lab results as part of my medical decision-making.   EKG Interpretation None      MDM   Final  diagnoses:  Facial droop  Dysarthria  Stroke (cerebrum) (HCC)  Stroke (cerebrum) (HCC)  64 year old female presenting to the ED as a code stroke. Last known well 0945 this morning. She reports expressive aphasia and left-sided numbness. She continues to have some mild dysarthria and blunting of left nasolabial fold on exam. NIH score of 2.  Not TPA candidate at this time as determined by Dr. Nicole Kindred. Patient will need to be admitted for stroke workup. Will continue aspirin and Plavix for now.  Work-up pending.  1140-- went to patient's room as i hear her screaming from down the hall.  Upon entering room family and chaplain are at bedside discussing her current situation and impending admission.  Patient is intermittently screaming, she is shaking all her extremities. When I addressed patient, she was able to squeeze my hand with her right hand and redirect her eyes to look directly at me. She continued shaking her extremities for another 10-15 seconds before stopping and began to cry. Her husband, patient had several episodes of this following her prior stroke as well as a few episodes at home.  Symptoms and presentation not consistent with grand mal seizure as patient is able to control arm movements and redirect her eyes when spoken to. Patient was given Ativan. I suspect this was anxiety related.  Stroke RN present on floor during this episode, i have updated her about this.  CT head negative for acute findings. Lab work with hyperglycemia without evidence of DKA. Anion gap remains normal at 14.  Patient was given small fluid bolus as well as IV insulin. Patient will be admitted to hospitalist service for further management.  Larene Pickett, PA-C 09/01/15 1352  Gareth Morgan, MD 09/02/15 [phone]

## 2015-09-01 NOTE — ED Notes (Signed)
Patient wheelchair to the restroom. Patient unsteady on feet,this nurse requested patient use bedpan or bedside command. Patient and family refused states they will stay with patient in restroom. Patient and family in restroom.

## 2015-09-01 NOTE — Consult Note (Signed)
Requesting Physician: ED MD    Chief Complaint: Cod stroke  HPI:                                                                                                                                         [name] is an 64 y.o. female with history of CVA about one year ago. Symptoms were similar to today's events. Patient woke up this AM feeling fine. At [phone] while she was on the phone with a friend she noted her words were not coming out correctly--"they were jibberish".  In addition some numbness of left arm and leg.  This subsided and she called 911. On arrival she did not feel back to baseline but felt significantly improved. She does take ASA and Plavix and states she has not missed any doses.   Date last known well: Date: 09/01/2015 Time last known well: Time: 09:55 tPA Given: No: NIHS 2 and symptoms minimal Modified Rankin: Rankin Score=0    Past Medical History  Diagnosis Date  . Diverticulitis, colon   . MS (multiple sclerosis) ([place])     Not confirmed  . Depression   . PVD (peripheral vascular disease) ([place])   . HLD (hyperlipidemia)   . Essential hypertension, benign   . CAD (coronary artery disease)     DES to circumflex 02/2007, BMS to LAD and PTCA diagonal 03/2007  . Carotid artery plaque     Mild  . GERD (gastroesophageal reflux disease)   . NSTEMI (non-ST elevated myocardial infarction) ([place])     02/2007  . Prolapse of uterus   . TIA (transient ischemic attack) 1980's  . PAT (paroxysmal atrial tachycardia) ([place])   . Anxiety   . CHF (congestive heart failure) ([place])   . Anginal pain ([place])   . Pneumonia 1980's    "once"  . IDDM (insulin dependent diabetes mellitus) ([place])   . H/O hiatal hernia   . Migraine     "used to have them really bad; don't have them anymore" (01/07/2014)  . Elevated d-dimer 01/08/2014  . Cataract     Past Surgical History  Procedure Laterality Date  . Appendectomy  ~ 1970  . Cholecystectomy  ?1987  . Breast biopsy Right 1980's  .  Colonoscopy  2002    Dr. Anwar--> Severe diverticular changes in the region of the sigmoid and descending colon with scattered diverticular changes throughout the rest of the colon. No polyps, ulcerations. Despite numerous manipulations, the tip of the scope could not be tipped into the cecal area.  . Colonoscopy  01/10/2012    Procedure: COLONOSCOPY;  Surgeon: Daneil Dolin, MD;  [facility];  Service: Endoscopy;  Laterality: N/A;  1:55  . Coronary angioplasty with stent placement  ~ 1997 X 2    "2 + 1"  . Cardiac catheterization  01/07/2014  . Abdominal hysterectomy  1986  ovaries remain - prolaspe uterus   . Breast lumpectomy Right 1980's    Dr. Charlynne Pander   . Eye surgery Bilateral 2014    cataract  . Left heart catheterization with coronary angiogram N/A 01/07/2014    Procedure: LEFT HEART CATHETERIZATION WITH CORONARY ANGIOGRAM;  Surgeon: Larey Dresser, MD;  Location: [facility] CATH LAB;  Service: Cardiovascular;  Laterality: N/A;    Family History  Problem Relation Age of Onset  . Heart attack Mother 62  . Diabetes Father   . Heart attack Father 63  . Heart attack Brother 30    x6  . Colon cancer Paternal Aunt     81s, died with brain anuerysm  . Crohn's disease Cousin     paternal   Social History:  reports that she has been passively smoking.  She has never used smokeless tobacco. She reports that she does not drink alcohol or use illicit drugs.  Allergies:  Allergies  Allergen Reactions  . Iohexol      Desc: pt had syncopal episode with nausea post IV CM late 1990's,  pt has had prednisone prep with heart caths x 2 without problem  kdean 04/16/07, Onset Date: [id number]   . Ticlid [Ticlopidine Hcl] Nausea And Vomiting  . Codeine Nausea And Vomiting and Palpitations    Medications:                                                                                                                           No current facility-administered medications for this  encounter.   Current Outpatient Prescriptions  Medication Sig Dispense Refill  . ALPRAZolam (XANAX) 0.5 MG tablet TAKE ONE TABLET 3 TIMES A DAY AS NEEDED. 90 tablet 1  . amoxicillin (AMOXIL) 500 MG capsule Take 1 capsule (500 mg total) by mouth 3 (three) times daily. For 10 days 30 capsule 0  . aspirin EC 325 MG tablet Take 325 mg by mouth daily.    Marland Kitchen atenolol (TENORMIN) 50 MG tablet Take 1 tablet (50 mg total) by mouth 2 (two) times daily. 180 tablet 3  . citalopram (CELEXA) 20 MG tablet Take 1 tablet (20 mg total) by mouth daily. 90 tablet 0  . clopidogrel (PLAVIX) 75 MG tablet Take 1 tablet (75 mg total) by mouth daily. 90 tablet 3  . Dulaglutide (TRULICITY) A999333 0000000 SOPN Inject 0.75 mg into the skin once a week. 1 mL 0  . furosemide (LASIX) 40 MG tablet Take 1 tablet (40 mg total) by mouth 2 (two) times daily. 180 tablet 3  . Insulin Detemir (LEVEMIR FLEXTOUCH) 100 UNIT/ML Pen Inject 35 Units into the skin 2 (two) times daily. 10 mL 0  . Insulin Glargine (LANTUS SOLOSTAR) 100 UNIT/ML Solostar Pen Inject 70 Units into the skin daily. Use in place of Levemir or Toujeo (Patient not taking: Reported on 08/04/2015) 2 pen 0  . Insulin Glargine (TOUJEO SOLOSTAR) 300 UNIT/ML SOPN Inject 70 Units into the skin daily. Use in  place of Levemir until able to get through patient assistance. (Patient not taking: Reported on 08/04/2015) 3 mL 0  . insulin lispro (HUMALOG KWIKPEN) 100 UNIT/ML KiwkPen Inject up 35 units TID PRN 30 mL 5  . isosorbide mononitrate (IMDUR) 120 MG 24 hr tablet Take 1 tablet (120 mg total) by mouth daily. 30 tablet 1  . lisinopril (PRINIVIL,ZESTRIL) 40 MG tablet Take 1 tablet (40 mg total) by mouth daily. 90 tablet 1  . nitroGLYCERIN (NITROSTAT) 0.4 MG SL tablet Place 1 tablet (0.4 mg total) under the tongue every 5 (five) minutes as needed. For chest pain 30 tablet 11  . potassium chloride SA (K-DUR,KLOR-CON) 20 MEQ tablet Take 1 tablet (20 mEq total) by mouth 2 (two) times  daily. 180 tablet 1  . rosuvastatin (CRESTOR) 40 MG tablet Take 1 tablet (40 mg total) by mouth daily. 90 tablet 4  . vitamin B-12 (CYANOCOBALAMIN) 1000 MCG tablet Take 1,000 mcg by mouth daily.    . Vitamin D, Ergocalciferol, (DRISDOL) 50000 UNITS CAPS capsule Take 1 capsule (50,000 Units total) by mouth every 7 (seven) days. 12 capsule 1  . zolpidem (AMBIEN) 10 MG tablet TAKE 1/2 TO 1 TABLET AT BEDTIME AS NEEDED 90 tablet 0     ROS:                                                                                                                                       History obtained from the patient  General ROS: negative for - chills, fatigue, fever, night sweats, weight gain or weight loss Psychological ROS: negative for - behavioral disorder, hallucinations, memory difficulties, mood swings or suicidal ideation Ophthalmic ROS: negative for - blurry vision, double vision, eye pain or loss of vision ENT ROS: negative for - epistaxis, nasal discharge, oral lesions, sore throat, tinnitus or vertigo Allergy and Immunology ROS: negative for - hives or itchy/watery eyes Hematological and Lymphatic ROS: negative for - bleeding problems, bruising or swollen lymph nodes Endocrine ROS: negative for - galactorrhea, hair pattern changes, polydipsia/polyuria or temperature intolerance Respiratory ROS: negative for - cough, hemoptysis, shortness of breath or wheezing Cardiovascular ROS: negative for - chest pain, dyspnea on exertion, edema or irregular heartbeat Gastrointestinal ROS: negative for - abdominal pain, diarrhea, hematemesis, nausea/vomiting or stool incontinence Genito-Urinary ROS: negative for - dysuria, hematuria, incontinence or urinary frequency/urgency Musculoskeletal ROS: negative for - joint swelling or muscular weakness Neurological ROS: as noted in HPI Dermatological ROS: negative for rash and skin lesion changes  Neurologic Examination:  Blood pressure 188/78, pulse 94, temperature 99.2 F (37.3 C), temperature source Oral, resp. rate 18, height 5\' 4"  (1.626 m), weight 82.1 kg (181 lb), SpO2 94 %.  HEENT-  Normocephalic, no lesions, without obvious abnormality.  Normal external eye and conjunctiva.  Normal TM's bilaterally.  Normal auditory canals and external ears. Normal external nose, mucus membranes and septum.  Normal pharynx. Cardiovascular- S1, S2 normal, pulses palpable throughout   Lungs- chest clear, no wheezing, rales, normal symmetric air entry Abdomen- normal findings: bowel sounds normal Extremities- no edema Lymph-no adenopathy palpable Musculoskeletal-no joint tenderness, deformity or swelling Skin-warm and dry, no hyperpigmentation, vitiligo, or suspicious lesions  Neurological Examination Mental Status: Alert, oriented, thought content appropriate.  Speech slightly dysarthric without evidence of aphasia.  Able to follow 3 step commands without difficulty. Cranial Nerves: II: Discs flat bilaterally; Visual fields grossly normal, pupils equal, round, reactive to light and accommodation III,IV, VI: ptosis not present, extra-ocular motions intact bilaterally V,VII: smile asymmetric on the right, facial light touch sensation normal bilaterally VIII: hearing normal bilaterally IX,X: uvula rises symmetrically XI: bilateral shoulder shrug XII: midline tongue extension Motor: Right : Upper extremity   5/5    Left:     Upper extremity   5/5  Lower extremity   5/5     Lower extremity   5/5 Tone and bulk:normal tone throughout; no atrophy noted Sensory: Pinprick and light touch intact throughout, bilaterally Deep Tendon Reflexes: 2+ and symmetric throughout Plantars: Right: downgoing   Left: downgoing Cerebellar: normal finger-to-nose,  and normal heel-to-shin test Gait: not tested       Lab Results: Basic Metabolic Panel:  Recent Labs Lab  09/01/15 1100  NA 134*  K 3.5  CL 88*  GLUCOSE 587*  BUN 13  CREATININE 0.80    Liver Function Tests: No results for input(s): AST, ALT, ALKPHOS, BILITOT, PROT, ALBUMIN in the last 168 hours. No results for input(s): LIPASE, AMYLASE in the last 168 hours. No results for input(s): AMMONIA in the last 168 hours.  CBC:  Recent Labs Lab 09/01/15 1049 09/01/15 1100  WBC 6.3  --   NEUTROABS 3.6  --   HGB 14.6 15.3*  HCT 42.1 45.0  MCV 87.0  --   PLT 140*  --     Cardiac Enzymes: No results for input(s): CKTOTAL, CKMB, CKMBINDEX, TROPONINI in the last 168 hours.  Lipid Panel: No results for input(s): CHOL, TRIG, HDL, CHOLHDL, VLDL, LDLCALC in the last 168 hours.  CBG:  Recent Labs Lab 09/01/15 1101  GLUCAP 540*    Microbiology: Results for orders placed or performed in visit on 09/06/14  Fecal occult blood, imunochemical     Status: None   Collection Time: 09/07/14 12:00 AM  Result Value Ref Range Status   Fecal Occult Bld Negative Negative Final    Coagulation Studies: No results for input(s): LABPROT, INR in the last 72 hours.  Imaging: No results found.    Etta Quill PA-C [facility] [phone]  09/01/2015, 11:10 AM   Assessment: 64 y.o. female presenting as code stroke with transient expressive aphasia and left arm/leg numbness.  Current exam is positive for right facial NLF decrease and slight dysarthria. Otherwise non-focal.  Given risk factors cannot rule out CVA/TIA. Patient was not a tPA candidate due to minimal residual deficits.  Stroke Risk Factors - diabetes mellitus, hyperlipidemia and hypertension, family history   1. HgbA1c, fasting lipid panel 2. MRI, MRA  of the brain without contrast 3. PT consult,  OT consult, Speech consult 4. Echocardiogram 5. Carotid dopplers 6. Prophylactic therapy-ASA and Plavix 7. Risk factor modification 8. Telemetry monitoring 9. Frequent neuro checks 10 NPO until passes stroke swallow  screen  I personally participated in this patient's evaluation and management, including clinical examination, as well as formulating the above clinical assessment and management recommendations.  Rush Farmer M.D. [facility] [phone]

## 2015-09-01 NOTE — ED Notes (Signed)
Pt became tachypneic and began moaning out and was tearful.  Pt was able to make eye contact and follow commands at this time.  No further neuro deficits were identified during the episode.  ED PA at bedside with pt.  V/O for Ativan received.  Pt family reports these episodes have occurred before. Event lasted aprox. 45 seconds to a minute.

## 2015-09-01 NOTE — ED Notes (Signed)
Pt reports that at 0950 this morning she was on the phone with a friend when she began to have trouble "getting her words out."  Pt reports she had numbness to left arm and left side of face.  Upon arrival symptoms had resolved. Pt in NAD at this time.

## 2015-09-01 NOTE — Progress Notes (Signed)
Patient arrived to 5M22 at 12. Patient alert and oriented X4 with no c/o pain. Vital signs taken and charted. Attending MD notified. Telemetry box 22 applied and CCMD notified. Oriented to room with bed alarm on.

## 2015-09-01 NOTE — H&P (Signed)
[facility] History and Physical  [name] [id number] DOB: 07-23-1951 DOA: 09/01/2015  Referring physician: Billy Fischer , Md PCP: Redge Gainer, MD   Chief Complaint: Slurred speech.   HPI: [name] is a 64 y.o. female with PMH significant for Diabetes uncontrolled, PVD, CAD, HTN, HLD, stroke who presents with slurred speech and left side numbness. Sh presents to ED as a code stroke. Her neurological symptoms improved in the ED. TPA was not given. She relates that around 9;45 she was on the phone with her sister  and her speech with slurred, she was speaking jibberish. Patient report a send episode in the ED where she had slurred speech, left side numbness, and per ED PA, patient had anxiety and patient was screaming and with tremors. I have informed neurology of episode.   Evaluation in the ED; CT head negative, CBG at 540,    Review of Systems:  Negative, except as per HPI.   Past Medical History  Diagnosis Date  . Diverticulitis, colon   . MS (multiple sclerosis) ([place])     Not confirmed  . Depression   . PVD (peripheral vascular disease) ([place])   . HLD (hyperlipidemia)   . Essential hypertension, benign   . CAD (coronary artery disease)     DES to circumflex 02/2007, BMS to LAD and PTCA diagonal 03/2007  . Carotid artery plaque     Mild  . GERD (gastroesophageal reflux disease)   . NSTEMI (non-ST elevated myocardial infarction) ([place])     02/2007  . Prolapse of uterus   . TIA (transient ischemic attack) 1980's  . PAT (paroxysmal atrial tachycardia) ([place])   . Anxiety   . CHF (congestive heart failure) ([place])   . Anginal pain ([place])   . Pneumonia 1980's    "once"  . IDDM (insulin dependent diabetes mellitus) ([place])   . H/O hiatal hernia   . Migraine     "used to have them really bad; don't have them anymore" (01/07/2014)  . Elevated d-dimer 01/08/2014  . Cataract    Past Surgical History  Procedure Laterality Date  . Appendectomy  ~ 1970  .  Cholecystectomy  ?1987  . Breast biopsy Right 1980's  . Colonoscopy  2002    Dr. Anwar--> Severe diverticular changes in the region of the sigmoid and descending colon with scattered diverticular changes throughout the rest of the colon. No polyps, ulcerations. Despite numerous manipulations, the tip of the scope could not be tipped into the cecal area.  . Colonoscopy  01/10/2012    Procedure: COLONOSCOPY;  Surgeon: Daneil Dolin, MD;  [facility];  Service: Endoscopy;  Laterality: N/A;  1:55  . Coronary angioplasty with stent placement  ~ 1997 X 2    "2 + 1"  . Cardiac catheterization  01/07/2014  . Abdominal hysterectomy  1986    ovaries remain - prolaspe uterus   . Breast lumpectomy Right 1980's    Dr. Charlynne Pander   . Eye surgery Bilateral 2014    cataract  . Left heart catheterization with coronary angiogram N/A 01/07/2014    Procedure: LEFT HEART CATHETERIZATION WITH CORONARY ANGIOGRAM;  Surgeon: Larey Dresser, MD;  Location: [facility] CATH LAB;  Service: Cardiovascular;  Laterality: N/A;   Social History:  reports that she has been passively smoking.  She has never used smokeless tobacco. She reports that she does not drink alcohol or use illicit drugs.  Allergies  Allergen Reactions  . Iohexol  Desc: pt had syncopal episode with nausea post IV CM late 1990's,  pt has had prednisone prep with heart caths x 2 without problem  kdean 04/16/07, Onset Date: [id number]   . Ticlid [Ticlopidine Hcl] Nausea And Vomiting  . Codeine Nausea And Vomiting and Palpitations    Family History  Problem Relation Age of Onset  . Heart attack Mother 53  . Diabetes Father   . Heart attack Father 60  . Heart attack Brother 30    x6  . Colon cancer Paternal Aunt     45s, died with brain anuerysm  . Crohn's disease Cousin     paternal    Prior to Admission medications   Medication Sig Start Date End Date Taking? Authorizing Provider  ALPRAZolam Duanne Moron) 0.5 MG tablet TAKE ONE TABLET 3  TIMES A DAY AS NEEDED. 07/27/15  Yes Chipper Herb, MD  aspirin EC 325 MG tablet Take 325 mg by mouth daily.   Yes Historical Provider, MD  atenolol (TENORMIN) 50 MG tablet Take 1 tablet (50 mg total) by mouth 2 (two) times daily. 04/26/14  Yes Chipper Herb, MD  citalopram (CELEXA) 20 MG tablet Take 1 tablet (20 mg total) by mouth daily. 03/08/14  Yes Chipper Herb, MD  clopidogrel (PLAVIX) 75 MG tablet Take 1 tablet (75 mg total) by mouth daily. 04/26/14  Yes Chipper Herb, MD  Dulaglutide (TRULICITY) A999333 0000000 SOPN Inject 0.75 mg into the skin once a week. 08/04/15  Yes Tammy Eckard, PHARMD  furosemide (LASIX) 40 MG tablet Take 1 tablet (40 mg total) by mouth 2 (two) times daily. 04/26/14  Yes Chipper Herb, MD  Insulin Detemir (LEVEMIR FLEXTOUCH) 100 UNIT/ML Pen Inject 35 Units into the skin 2 (two) times daily. 12/01/14  Yes Tammy Eckard, PHARMD  insulin lispro (HUMALOG KWIKPEN) 100 UNIT/ML KiwkPen Inject up 35 units TID PRN Patient taking differently: Inject 35 Units into the skin 3 (three) times daily.  09/27/14  Yes Mary-Margaret Hassell Done, FNP  isosorbide mononitrate (IMDUR) 120 MG 24 hr tablet Take 1 tablet (120 mg total) by mouth daily. 03/25/15  Yes Tammy Eckard, PHARMD  lisinopril (PRINIVIL,ZESTRIL) 40 MG tablet Take 1 tablet (40 mg total) by mouth daily. 08/04/15  Yes Tammy Eckard, PHARMD  nitroGLYCERIN (NITROSTAT) 0.4 MG SL tablet Place 1 tablet (0.4 mg total) under the tongue every 5 (five) minutes as needed. For chest pain 06/11/13  Yes Chipper Herb, MD  potassium chloride SA (K-DUR,KLOR-CON) 20 MEQ tablet Take 1 tablet (20 mEq total) by mouth 2 (two) times daily. 10/25/14  Yes Tammy Eckard, PHARMD  rosuvastatin (CRESTOR) 40 MG tablet Take 1 tablet (40 mg total) by mouth daily. 06/11/13  Yes Chipper Herb, MD  vitamin B-12 (CYANOCOBALAMIN) 1000 MCG tablet Take 1,000 mcg by mouth daily.   Yes Historical Provider, MD  Vitamin D, Ergocalciferol, (DRISDOL) 50000 UNITS CAPS capsule Take 1 capsule  (50,000 Units total) by mouth every 7 (seven) days. 08/04/15  Yes Tammy Eckard, PHARMD  zolpidem (AMBIEN) 10 MG tablet TAKE 1/2 TO 1 TABLET AT BEDTIME AS NEEDED 03/25/15  Yes Mary-Margaret Hassell Done, FNP   Physical Exam: Filed Vitals:   09/01/15 1103  BP: 188/78  Pulse: 94  Temp: 99.2 F (37.3 C)  TempSrc: Oral  Resp: 18  Height: 5\' 4"  (1.626 m)  Weight: 82.1 kg (181 lb)  SpO2: 94%    Wt Readings from Last 3 Encounters:  09/01/15 82.1 kg (181 lb)  08/04/15 83.462 kg (184 lb)  07/08/15 83.008 kg (183 lb)    General:  Appears calm and comfortable Eyes: PERRL, normal lids, irises & conjunctiva ENT: grossly normal hearing, lips & tongue Neck: no LAD, masses or thyromegaly Cardiovascular: RRR, no m/r/g. No LE edema. Telemetry: SR, no arrhythmias  Respiratory: CTA bilaterally, no w/r/r. Normal respiratory effort. Abdomen: soft, ntnd Skin: no rash or induration seen on limited exam Musculoskeletal: grossly normal tone BUE/BLE Psychiatric: grossly normal mood and affect, speech fluent and appropriate Neurologic: grossly non-focal. Mild slurred speech, motor strength 5/5. Mild left side numbness           Labs on Admission:  Basic Metabolic Panel:  Recent Labs Lab 09/01/15 1049 09/01/15 1100  NA 133* 134*  K 3.6 3.5  CL 91* 88*  CO2 28  --   GLUCOSE 576* 587*  BUN 11 13  CREATININE 0.99 0.80  CALCIUM 9.3  --    Liver Function Tests:  Recent Labs Lab 09/01/15 1049  AST 31  ALT 25  ALKPHOS 57  BILITOT 0.8  PROT 6.9  ALBUMIN 3.5   No results for input(s): LIPASE, AMYLASE in the last 168 hours. No results for input(s): AMMONIA in the last 168 hours. CBC:  Recent Labs Lab 09/01/15 1049 09/01/15 1100  WBC 6.3  --   NEUTROABS 3.6  --   HGB 14.6 15.3*  HCT 42.1 45.0  MCV 87.0  --   PLT 140*  --    Cardiac Enzymes: No results for input(s): CKTOTAL, CKMB, CKMBINDEX, TROPONINI in the last 168 hours.  BNP (last 3 results) No results for input(s): BNP in the  last 8760 hours.  ProBNP (last 3 results) No results for input(s): PROBNP in the last 8760 hours.  CBG:  Recent Labs Lab 09/01/15 1101  GLUCAP 540*    Radiological Exams on Admission: Ct Head Wo Contrast  09/01/2015  CLINICAL DATA:  Speech difficulty, code stroke EXAM: CT HEAD WITHOUT CONTRAST TECHNIQUE: Contiguous axial images were obtained from the base of the skull through the vertex without intravenous contrast. COMPARISON:  08/14/2014 FINDINGS: No skull fracture is noted. Paranasal sinuses and mastoid air cells are unremarkable. No intracranial hemorrhage, mass effect or midline shift. Mild cerebral atrophy is stable. No definite acute cortical infarction. Ventricular size is stable from prior exam. No mass lesion is noted on this unenhanced scan. IMPRESSION: No acute intracranial abnormality. Mild cerebral atrophy. No definite acute cortical infarction. These results were called by telephone at the time of interpretation on 09/01/2015 at 11:08 am to Dr. Nicole Kindred, who verbally acknowledged these results. Electronically Signed   By: Lahoma Crocker M.D.   On: 09/01/2015 11:09    EKG: Independently reviewed. Sinus rhythm, inferior old infarct.   Assessment/Plan Active Problems:   Hyperlipidemia   CAD, NATIVE VESSEL, cath 01/07/14 non obstructive coronary disease   Uncontrolled type 2 diabetes mellitus with insulin therapy ([place])   Essential hypertension, benign   Facial droop  1-Acute Stroke; Admit to telemetry. Frequent neuro check, MRI, MRA, Carotid doppler and ECHO ordered.  Will continue with aspirin and Plavix.  Permissive HTN.   2-Diabetes uncontrolled; Hyperglycemia. Will order SSI> will resume levemir lower home dose.   3-HTN; permissive HTN. Hold BP medications. PRN hydralazine for   4-CAD; will hold medication for now to avoid hypotension in setting of possible stroke. Will hold lasix.   Code Status: full code.  DVT Prophylaxis:lovenox Family Communication: care discussed  with multiple family members at bedside.  Disposition Plan: expect less than 2  days inpatient.   Time spent: 75 minutes.   Niel Hummer A [facility] Pager [phone]

## 2015-09-01 NOTE — ED Notes (Signed)
Admitting MD at bedside.

## 2015-09-01 NOTE — ED Notes (Signed)
Baird Cancer PA made aware of CBG.  Pt and family reports that pt CBG usually runs 300.

## 2015-09-01 NOTE — Code Documentation (Signed)
64yo female arriving to [facility] via [place] at 1046.  Patient reports that she was at home when she answered the phone around 0945 and had difficulty speaking.  EMS called and assessed left arm drift and facial droop and activated a code stroke.  CBG 536 en route.  EMS reports symptoms have improved.  Stroke team at the bedside on patient arrival.  Labs drawn and patient cleared for CT by Dr. Louis Meckel.  Patient to CT.  NIHSS 2, see documentation for details and code stroke times.  Patient with slight right facial droop and mild dysarthria on exam.  Patient reports that speech is much improved but not back to baseline.  Patient with a h/o stroke last year (November?) but had no residual deficits and takes ASA and Plavix.  Patient has not yet had her Insulin this morning.  Dr. Nicole Kindred to the bedside.  Patient's symptoms are too mild to treat with tPA at this time, however, patient remains in the window to treat with tPA until 1415 should symptoms worsen.  Patient to be admitted.  Bedside handoff with ED RN Junie Panning.

## 2015-09-01 NOTE — ED Notes (Signed)
Attempted to give report states needs to call RN back.

## 2015-09-02 ENCOUNTER — Inpatient Hospital Stay (HOSPITAL_COMMUNITY): Payer: Commercial Managed Care - HMO

## 2015-09-02 DIAGNOSIS — K219 Gastro-esophageal reflux disease without esophagitis: Secondary | ICD-10-CM | POA: Insufficient documentation

## 2015-09-02 DIAGNOSIS — I25119 Atherosclerotic heart disease of native coronary artery with unspecified angina pectoris: Secondary | ICD-10-CM

## 2015-09-02 DIAGNOSIS — Z794 Long term (current) use of insulin: Secondary | ICD-10-CM

## 2015-09-02 DIAGNOSIS — F411 Generalized anxiety disorder: Secondary | ICD-10-CM

## 2015-09-02 DIAGNOSIS — I6789 Other cerebrovascular disease: Secondary | ICD-10-CM

## 2015-09-02 DIAGNOSIS — E1165 Type 2 diabetes mellitus with hyperglycemia: Secondary | ICD-10-CM

## 2015-09-02 DIAGNOSIS — I639 Cerebral infarction, unspecified: Secondary | ICD-10-CM

## 2015-09-02 LAB — LIPID PANEL
Cholesterol: 121 mg/dL (ref 0–200)
HDL: 25 mg/dL — ABNORMAL LOW (ref >40)
LDL CALC: 48 mg/dL (ref 0–99)
Total CHOL/HDL Ratio: 4.8 RATIO
Triglycerides: 239 mg/dL — ABNORMAL HIGH (ref <150)
VLDL: 48 mg/dL — AB (ref 0–40)

## 2015-09-02 LAB — RAPID URINE DRUG SCREEN, HOSP PERFORMED
Amphetamines: NOT DETECTED
BENZODIAZEPINES: POSITIVE — AB
Barbiturates: NOT DETECTED
COCAINE: NOT DETECTED
Opiates: NOT DETECTED
Tetrahydrocannabinol: NOT DETECTED

## 2015-09-02 LAB — GLUCOSE, CAPILLARY
Glucose-Capillary: 289 mg/dL — ABNORMAL HIGH (ref 65–99)
Glucose-Capillary: 306 mg/dL — ABNORMAL HIGH (ref 65–99)

## 2015-09-02 NOTE — Progress Notes (Signed)
*  PRELIMINARY RESULTS* Vascular Ultrasound Carotid Duplex (Doppler) has been completed.   Findings suggest 1-39% internal carotid artery stenosis bilaterally. Vertebral arteries are patent with antegrade flow.  09/02/2015 10:20 AM Maudry Mayhew, RVT, RDCS, RDMS

## 2015-09-02 NOTE — Discharge Summary (Signed)
Physician Discharge Summary  [name] [id number] DOB: 1951/05/24 DOA: 09/01/2015  PCP: Redge Gainer, MD  Admit date: 09/01/2015 Discharge date: 09/02/2015  Time spent 35  minutes  Recommendations for Outpatient Follow-up:  1. Follow up with PCP in 2-4 weeks   Discharge Diagnoses:  Active Problems:   Hyperlipidemia   CAD, NATIVE VESSEL, cath 01/07/14 non obstructive coronary disease   Uncontrolled type 2 diabetes mellitus with insulin therapy ([name])   Essential hypertension, benign   Facial droop   Stroke [facility])   Discharge Condition: stab;e  Diet recommendation: heart hea;thy  Filed Weights   09/01/15 1103  Weight: 82.1 kg (181 lb)    History of present illness:  64 y.o. female with PMH significant for Diabetes uncontrolled, PVD, CAD, HTN, HLD, stroke who presents with slurred speech and left side numbness. [name] presents to ED as a code stroke. Her neurological symptoms improved in the ED. TPA was not given. She relates that around 9;45 she was on the phone with her sister and her speech with slurred, she was speaking jibberish.  Hospital Course:  Slurred speech: HgbA1c pending, fasting lipid panel HDL < 40, LDL < 70. MRI, MRA of the brain without contrast No acute CVA. Echocardiogram and Carotid dopplers no acute findings on 2-D echo, pleasant 40% stenosis bilaterally and carotids Prophylactic therapy-Antiplatelet med: Continue aspirin and Plavix at home. Cardiac Monitoring No events. Question of this event was likely due to panic attack she is on Xanax and Celexa. Follow-up with PCP in 2-4 weeks.  CAD, NATIVE VESSEL, cath 01/07/14 non obstructive coronary disease: Continue Plavix. Chest pain-free.  Uncontrolled type 2 diabetes mellitus with insulin therapy ([name]) Blood glucose significantly improve after 12 units of NovoLog +25 sliding scale. Cont. CBGs and before meals and at bedtime.  Essential hypertension, benign  Procedures:  MRI brain  CT  head  Carotid doppler no ICA stenosis  ECHO  Consultations:  neurology  Discharge Exam: Filed Vitals:   09/02/15 0345 09/02/15 1130  BP: 162/70 120/50  Pulse: 74 76  Temp: 98.2 F (36.8 C) 97.7 F (36.5 C)  Resp: 16 20    General: A&O x3 Cardiovascular: RRR Respiratory: good air movement CAT B/L  Discharge Instructions   Discharge Instructions    Diet - low sodium heart healthy    Complete by:  As directed      Increase activity slowly    Complete by:  As directed           Current Discharge Medication List    CONTINUE these medications which have NOT CHANGED   Details  ALPRAZolam (XANAX) 0.5 MG tablet TAKE ONE TABLET 3 TIMES A DAY AS NEEDED. Qty: 90 tablet, Refills: 1    aspirin EC 325 MG tablet Take 325 mg by mouth daily.    atenolol (TENORMIN) 50 MG tablet Take 1 tablet (50 mg total) by mouth 2 (two) times daily. Qty: 180 tablet, Refills: 3    citalopram (CELEXA) 20 MG tablet Take 1 tablet (20 mg total) by mouth daily. Qty: 90 tablet, Refills: 0    clopidogrel (PLAVIX) 75 MG tablet Take 1 tablet (75 mg total) by mouth daily. Qty: 90 tablet, Refills: 3    Dulaglutide (TRULICITY) A999333 [id number] SOPN Inject 0.75 mg into the skin once a week. Qty: 1 mL, Refills: 0    furosemide (LASIX) 40 MG tablet Take 1 tablet (40 mg total) by mouth 2 (two) times daily. Qty: 180 tablet, Refills: 3    Insulin Detemir (  LEVEMIR FLEXTOUCH) 100 UNIT/ML Pen Inject 35 Units into the skin 2 (two) times daily. Qty: 10 mL, Refills: 0    insulin lispro (HUMALOG KWIKPEN) 100 UNIT/ML KiwkPen Inject up 35 units TID PRN Qty: 30 mL, Refills: 5    isosorbide mononitrate (IMDUR) 120 MG 24 hr tablet Take 1 tablet (120 mg total) by mouth daily. Qty: 30 tablet, Refills: 1    lisinopril (PRINIVIL,ZESTRIL) 40 MG tablet Take 1 tablet (40 mg total) by mouth daily. Qty: 90 tablet, Refills: 1    nitroGLYCERIN (NITROSTAT) 0.4 MG SL tablet Place 1 tablet (0.4 mg total) under the tongue  every 5 (five) minutes as needed. For chest pain Qty: 30 tablet, Refills: 11    potassium chloride SA (K-DUR,KLOR-CON) 20 MEQ tablet Take 1 tablet (20 mEq total) by mouth 2 (two) times daily. Qty: 180 tablet, Refills: 1    rosuvastatin (CRESTOR) 40 MG tablet Take 1 tablet (40 mg total) by mouth daily. Qty: 90 tablet, Refills: 4    vitamin B-12 (CYANOCOBALAMIN) 1000 MCG tablet Take 1,000 mcg by mouth daily.    Vitamin D, Ergocalciferol, (DRISDOL) 50000 UNITS CAPS capsule Take 1 capsule (50,000 Units total) by mouth every 7 (seven) days. Qty: 12 capsule, Refills: 1    zolpidem (AMBIEN) 10 MG tablet TAKE 1/2 TO 1 TABLET AT BEDTIME AS NEEDED Qty: 90 tablet, Refills: 0       Allergies  Allergen Reactions  . Iohexol      Desc: pt had syncopal episode with nausea post IV CM late 1990's,  pt has had prednisone prep with heart caths x 2 without problem  kdean 04/16/07, Onset Date: [id number]   . Ticlid [Ticlopidine Hcl] Nausea And Vomiting  . Codeine Nausea And Vomiting and Palpitations      The results of significant diagnostics from this hospitalization (including imaging, microbiology, ancillary and laboratory) are listed below for reference.    Significant Diagnostic Studies: Dg Chest 2 View  09/01/2015  CLINICAL DATA:  Recent stroke EXAM: CHEST - 2 VIEW COMPARISON:  None. FINDINGS: The heart size and mediastinal contours are within normal limits. Both lungs are clear. The visualized skeletal structures are unremarkable. IMPRESSION: No active disease. Electronically Signed   By: Inez Catalina M.D.   On: 09/01/2015 19:25   Ct Head Wo Contrast  09/01/2015  CLINICAL DATA:  Speech difficulty, code stroke EXAM: CT HEAD WITHOUT CONTRAST TECHNIQUE: Contiguous axial images were obtained from the base of the skull through the vertex without intravenous contrast. COMPARISON:  08/14/2014 FINDINGS: No skull fracture is noted. Paranasal sinuses and mastoid air cells are unremarkable. No intracranial  hemorrhage, mass effect or midline shift. Mild cerebral atrophy is stable. No definite acute cortical infarction. Ventricular size is stable from prior exam. No mass lesion is noted on this unenhanced scan. IMPRESSION: No acute intracranial abnormality. Mild cerebral atrophy. No definite acute cortical infarction. These results were called by telephone at the time of interpretation on 09/01/2015 at 11:08 am to Dr. Nicole Kindred, who verbally acknowledged these results. Electronically Signed   By: Lahoma Crocker M.D.   On: 09/01/2015 11:09   Mr Brain Wo Contrast  09/01/2015  CLINICAL DATA:  64 y.o. female presenting as code stroke with transient expressive aphasia and left arm/leg numbness. Patient was not a tPA candidate due to minimal residual deficits. Stroke risk factors include diabetes, hypertension, and hyperlipidemia. EXAM: MRI HEAD WITHOUT CONTRAST MRA HEAD WITHOUT CONTRAST TECHNIQUE: Multiplanar, multiecho pulse sequences of the brain and surrounding structures were obtained  without intravenous contrast. Angiographic images of the head were obtained using MRA technique without contrast. COMPARISON:  CT head earlier today.  MR head 08/14/2014. FINDINGS: MRI HEAD FINDINGS No evidence for acute infarction, hemorrhage, mass lesion, or extra-axial fluid. Moderate cerebral and cerebellar atrophy. Hydrocephalus ex vacuo. Minor white matter disease, likely chronic microvascular ischemic change. Flow voids are maintained throughout the carotid, basilar, and vertebral arteries. There are no areas of chronic hemorrhage. Pituitary, pineal, and cerebellar tonsils unremarkable. No upper cervical lesions. Visualized calvarium, skull base, and upper cervical osseous structures unremarkable. Scalp and extracranial soft tissues, orbits, sinuses, and mastoids show no acute process. BILATERAL cataract extraction. Chronic sinus disease is improved from priors. MRA HEAD FINDINGS The internal carotid arteries are widely patent. Basilar  artery is widely patent with both vertebrals contributing. Slight basilar hypoplasia due to RIGHT fetal PCA. No intracranial stenosis or aneurysm. IMPRESSION: No acute stroke is evident. Stable atrophy and small vessel disease. No significant or flow reducing abnormality seen on intracranial MRA. Electronically Signed   By: Staci Righter M.D.   On: 09/01/2015 18:38   Mr Jodene Nam Head/brain Wo Cm  09/01/2015  CLINICAL DATA:  64 y.o. female presenting as code stroke with transient expressive aphasia and left arm/leg numbness. Patient was not a tPA candidate due to minimal residual deficits. Stroke risk factors include diabetes, hypertension, and hyperlipidemia. EXAM: MRI HEAD WITHOUT CONTRAST MRA HEAD WITHOUT CONTRAST TECHNIQUE: Multiplanar, multiecho pulse sequences of the brain and surrounding structures were obtained without intravenous contrast. Angiographic images of the head were obtained using MRA technique without contrast. COMPARISON:  CT head earlier today.  MR head 08/14/2014. FINDINGS: MRI HEAD FINDINGS No evidence for acute infarction, hemorrhage, mass lesion, or extra-axial fluid. Moderate cerebral and cerebellar atrophy. Hydrocephalus ex vacuo. Minor white matter disease, likely chronic microvascular ischemic change. Flow voids are maintained throughout the carotid, basilar, and vertebral arteries. There are no areas of chronic hemorrhage. Pituitary, pineal, and cerebellar tonsils unremarkable. No upper cervical lesions. Visualized calvarium, skull base, and upper cervical osseous structures unremarkable. Scalp and extracranial soft tissues, orbits, sinuses, and mastoids show no acute process. BILATERAL cataract extraction. Chronic sinus disease is improved from priors. MRA HEAD FINDINGS The internal carotid arteries are widely patent. Basilar artery is widely patent with both vertebrals contributing. Slight basilar hypoplasia due to RIGHT fetal PCA. No intracranial stenosis or aneurysm. IMPRESSION: No  acute stroke is evident. Stable atrophy and small vessel disease. No significant or flow reducing abnormality seen on intracranial MRA. Electronically Signed   By: Staci Righter M.D.   On: 09/01/2015 18:38    Microbiology: No results found for this or any previous visit (from the past 240 hour(s)).   Labs: Basic Metabolic Panel:  Recent Labs Lab 09/01/15 1049 09/01/15 1100  NA 133* 134*  K 3.6 3.5  CL 91* 88*  CO2 28  --   GLUCOSE 576* 587*  BUN 11 13  CREATININE 0.99 0.80  CALCIUM 9.3  --    Liver Function Tests:  Recent Labs Lab 09/01/15 1049  AST 31  ALT 25  ALKPHOS 57  BILITOT 0.8  PROT 6.9  ALBUMIN 3.5   No results for input(s): LIPASE, AMYLASE in the last 168 hours. No results for input(s): AMMONIA in the last 168 hours. CBC:  Recent Labs Lab 09/01/15 1049 09/01/15 1100  WBC 6.3  --   NEUTROABS 3.6  --   HGB 14.6 15.3*  HCT 42.1 45.0  MCV 87.0  --   PLT  140*  --    Cardiac Enzymes: No results for input(s): CKTOTAL, CKMB, CKMBINDEX, TROPONINI in the last 168 hours. BNP: BNP (last 3 results) No results for input(s): BNP in the last 8760 hours.  ProBNP (last 3 results) No results for input(s): PROBNP in the last 8760 hours.  CBG:  Recent Labs Lab 09/01/15 1101 09/01/15 1238 09/01/15 2226 09/02/15 0625  GLUCAP 540* 468* 386* 289*     Signed:  Charlynne Cousins  [facility] 09/02/2015, 12:02 PM

## 2015-09-02 NOTE — Progress Notes (Signed)
  Echocardiogram 2D Echocardiogram has been performed.  Johny Chess 09/02/2015, 10:04 AM

## 2015-09-02 NOTE — Progress Notes (Signed)
STROKE TEAM PROGRESS NOTE   HISTORY [name] is an 64 y.o. female with history of CVA about one year ago. Symptoms were similar to today's events. Patient woke up this AM feeling fine. At [phone] while she was on the phone with a friend she noted her words were not coming out correctly--"they were jibberish". In addition some numbness of left arm and leg. This subsided and she called 911. On arrival she did not feel back to baseline but felt significantly improved. She does take ASA and Plavix and states she has not missed any doses.   Date last known well: Date: 09/01/2015 Time last known well: Time: 09:55 tPA Given: No: NIHS 2 and symptoms minimal Modified Rankin: Rankin Score=0   SUBJECTIVE (INTERVAL HISTORY) No family members present. Her speech appears to be back to baseline. She was informed that the imaging was negative for an acute stroke. And so far stroke work up was negative.   She stated that during the episodes, she felt her throat closed off with hoarseness and then gibberish words, resolved quickly. She does have GERD and not sure if this is related to acid reflex.  Similar episode happened one year ago.    OBJECTIVE Temp:  [97.7 F (36.5 C)-98.4 F (36.9 C)] 97.7 F (36.5 C) (12/09 1130) Pulse Rate:  [74-86] 76 (12/09 1130) Cardiac Rhythm:  [-] Normal sinus rhythm (12/09 0700) Resp:  [16-20] 20 (12/09 1130) BP: (120-189)/(50-76) 120/50 mmHg (12/09 1130) SpO2:  [94 %-97 %] 96 % (12/09 1130)  CBC:   Recent Labs Lab 09/01/15 1049 09/01/15 1100  WBC 6.3  --   NEUTROABS 3.6  --   HGB 14.6 15.3*  HCT 42.1 45.0  MCV 87.0  --   PLT 140*  --     Basic Metabolic Panel:   Recent Labs Lab 09/01/15 1049 09/01/15 1100  NA 133* 134*  K 3.6 3.5  CL 91* 88*  CO2 28  --   GLUCOSE 576* 587*  BUN 11 13  CREATININE 0.99 0.80  CALCIUM 9.3  --     Lipid Panel:     Component Value Date/Time   CHOL 121 09/02/2015 0608   CHOL 147 04/06/2015 1038   CHOL 107  02/05/2013 1002   TRIG 239* 09/02/2015 0608   TRIG CANCELED 07/08/2015 1116   TRIG 134 07/08/2015 1116   TRIG 143 02/05/2013 1002   HDL 25* 09/02/2015 0608   HDL CANCELED 07/08/2015 1116   HDL 32* 07/08/2015 1116   HDL 43 04/06/2015 1038   HDL 35* 02/05/2013 1002   CHOLHDL 4.8 09/02/2015 0608   CHOLHDL 3.4 04/06/2015 1038   VLDL 48* 09/02/2015 0608   LDLCALC 48 09/02/2015 0608   LDLCALC 81 04/06/2015 1038   LDLCALC 195* 04/26/2014 1029   LDLCALC 43 02/05/2013 1002   HgbA1c:  Lab Results  Component Value Date   HGBA1C 8.8 07/08/2015   Urine Drug Screen:     Component Value Date/Time   LABOPIA NONE DETECTED 09/02/2015 0850   COCAINSCRNUR NONE DETECTED 09/02/2015 0850   LABBENZ POSITIVE* 09/02/2015 0850   AMPHETMU NONE DETECTED 09/02/2015 0850   THCU NONE DETECTED 09/02/2015 0850   LABBARB NONE DETECTED 09/02/2015 0850      IMAGING I have personally reviewed the radiological images below and agree with the radiology interpretations.  Dg Chest 2 View 09/01/2015   No active disease.   Ct Head Wo Contrast 09/01/2015   No acute intracranial abnormality. Mild cerebral atrophy. No definite  acute cortical infarction.   Mr [name] Head/brain Wo Cm 09/01/2015   No acute stroke is evident. Stable atrophy and small vessel disease. No significant or flow reducing abnormality seen on intracranial MRA.   CUS - Bilateral: 1-39% ICA stenosis. Vertebral artery flow is antegrade.  2D echo - - Left ventricle: The cavity size was normal. Systolic function was vigorous. The estimated ejection fraction was in the range of 65% to 70%. Wall motion was normal; there were no regional wall motion abnormalities. Doppler parameters are consistent with abnormal left ventricular relaxation (grade 1 diastolic dysfunction). Doppler parameters are consistent with elevated ventricular end-diastolic filling pressure. - Aortic root: The aortic root was normal in size. - Mitral valve:  Calcified annulus. Mildly thickened leaflets . The findings are consistent with mild stenosis. There was mild regurgitation. - Left atrium: The atrium was mildly dilated. - Right ventricle: Systolic function was normal. - Right atrium: The atrium was normal in size. - Tricuspid valve: There was trivial regurgitation. - Pulmonic valve: There was no regurgitation. - Pulmonary arteries: Systolic pressure was within the normal range. - Inferior vena cava: The vessel was normal in size. - Pericardium, extracardiac: There was no pericardial effusion.  Impressions:  - Mild mitral stenosis and elevated filling pressures.   PHYSICAL EXAM  Temp:  [97.7 F (36.5 C)-98.4 F (36.9 C)] 97.7 F (36.5 C) (12/09 1130) Pulse Rate:  [74-86] 76 (12/09 1130) Resp:  [16-20] 20 (12/09 1130) BP: (120-189)/(50-76) 120/50 mmHg (12/09 1130) SpO2:  [95 %-97 %] 96 % (12/09 1130)  General - Well nourished, well developed, in no apparent distress.  Ophthalmologic - Sharp disc margins OU.   Cardiovascular - Regular rate and rhythm with no murmur.  Mental Status -  Level of arousal and orientation to time, place, and person were intact. Language including expression, naming, repetition, comprehension was assessed and found intact. Fund of Knowledge was assessed and was intact.  Cranial Nerves II - XII - II - Visual field intact OU. III, IV, VI - Extraocular movements intact. V - Facial sensation intact bilaterally. VII - Facial movement intact bilaterally. VIII - Hearing & vestibular intact bilaterally. X - Palate elevates symmetrically. XI - Chin turning & shoulder shrug intact bilaterally. XII - Tongue protrusion intact.  Motor Strength - The patient's strength was normal in all extremities and pronator drift was absent.  Bulk was normal and fasciculations were absent.   Motor Tone - Muscle tone was assessed at the neck and appendages and was normal.  Reflexes - The patient's reflexes  were 1+ in all extremities and she had no pathological reflexes.  Sensory - Light touch, temperature/pinprick, vibration and proprioception, and Romberg testing were assessed and were symmetrical.    Coordination - The patient had normal movements in the hands and feet with no ataxia or dysmetria.  Tremor was absent.  Gait and Station - The patient's transfers, posture, gait, station, and turns were observed as normal   ASSESSMENT/PLAN [name] is a 64 y.o. female with history of previous ? TIA, depression, MS, HLD, HTN, CAD / MI, CHF, DM, and migraine headaches  presenting with speech difficulties associated with numbness of the left arm and leg.  She did not receive IV t-PA due to minimal deficits.   Possible TIA vs. GERD vs. Anxiety - pt dose have multiple stroke risk factors but stroke work up negative. Words finding difficulty not commonly go along with left sided symptoms. On dual antiplatelet and statin at home.  Pt admits GERD hx, feeling of throat closing off and hoarseness of voice at time of onset.   Resultant - resolution of deficits  MRI  no acute stroke  MRA  no significant abnormalities  Carotid Doppler  1-39% internal carotid artery stenosis bilaterally. Vertebral arteries are patent with antegrade flow.  2D Echo - EF 65-70%. Mild mitral stenosis.   LDL 43  HgbA1c pending  VTE prophylaxis - Lovenox Diet Carb Modified Fluid consistency:: Thin; Room service appropriate?: Yes Diet - low sodium heart healthy  aspirin 325 mg daily and clopidogrel 75 mg daily prior to admission, now on aspirin 325 mg daily and clopidogrel 75 mg daily. Continue dual antiplatelet therapy for stroke and cardiac prevention.  Patient counseled to be compliant with her antithrombotic medications  Ongoing aggressive stroke risk factor management  Therapy recommendations: Pending  Disposition: Pending  Hypertension  Home meds - atenolol, lasix, lisinopril  Stable  BP goal  normotensive  Hyperlipidemia  Home meds:  Crestor 40 mg daily resumed in hospital  LDL 43, goal < 70  Continue statin at discharge  Diabetes  HgbA1c pending, goal < 7.0  Uncontrolled  Previous insulin pump, now on levemir  Glucose at home fluctuating  Follow up with PCP closely  Other Stroke Risk Factors  Advanced age  Obesity, Body mass index is 31.05 kg/(m^2).   Hx stroke/TIA  Coronary artery disease  Migraines  Other Active Problems  Mild hyponatremia  Hospital day # 1  Neurology will sign off. Please call with questions. Pt will follow up with NP Cecille Rubin at [facility] in about 1 month. Thanks for the consult.  Rosalin Hawking, MD PhD Stroke Neurology 09/02/2015 4:20 PM    To contact Stroke Continuity provider, please refer to [email or web address] After hours, contact General Neurology

## 2015-09-02 NOTE — Progress Notes (Signed)
Patient d/c home. D/c instructions given and patient verbalized understanding. 

## 2015-09-02 NOTE — Evaluation (Signed)
Speech Language Pathology Evaluation Patient Details Name: [name] MRN: [id number] DOB: June 18, 1951 Today's Date: 09/02/2015 Time: 0850-0920 SLP Time Calculation (min) (ACUTE ONLY): 30 min  Problem List:  Patient Active Problem List   Diagnosis Date Noted  . Facial droop 09/01/2015  . Stroke ([place]) 09/01/2015  . TIA (transient ischemic attack) 08/14/2014  . DM type 2 with diabetic dyslipidemia ([place]) 08/14/2014  . Cataract 02/08/2014  . Macular degeneration 02/08/2014  . Elevated d-dimer, neg VQ scan 01/08/2014  . Unstable angina ([place]) 01/07/2014  . Chest pain 12/30/2013  . Generalized anxiety disorder 09/14/2013  . Claudication ([place]) 06/18/2013  . Diverticulosis of colon 01/01/2012  . Left lower quadrant pain 01/01/2012  . GERD (gastroesophageal reflux disease) 01/01/2012  . Constipation 01/01/2012  . Hypokalemia 08/24/2011  . Uncontrolled type 2 diabetes mellitus with insulin therapy ([place]) 08/23/2011  . Essential hypertension, benign 08/23/2011  . PALPITATIONS 02/10/2010  . Hyperlipidemia 05/10/2009  . DEPRESSION 05/10/2009  . CAD, NATIVE VESSEL, cath 01/07/14 non obstructive coronary disease 08/26/2008  . 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 08/26/2008   Past Medical History:  Past Medical History  Diagnosis Date  . Diverticulitis, colon   . MS (multiple sclerosis) ([place])     Not confirmed  . Depression   . PVD (peripheral vascular disease) ([place])   . HLD (hyperlipidemia)   . Essential hypertension, benign   . CAD (coronary artery disease)     DES to circumflex 02/2007, BMS to LAD and PTCA diagonal 03/2007  . Carotid artery plaque     Mild  . GERD (gastroesophageal reflux disease)   . NSTEMI (non-ST elevated myocardial infarction) ([place])     02/2007  . Prolapse of uterus   . TIA (transient ischemic attack) 1980's  . PAT (paroxysmal atrial tachycardia) ([place])   . Anxiety   . CHF (congestive heart failure) ([place])   . Anginal pain ([place])   . Pneumonia 1980's    "once"  . IDDM  (insulin dependent diabetes mellitus) ([place])   . H/O hiatal hernia   . Migraine     "used to have them really bad; don't have them anymore" (01/07/2014)  . Elevated d-dimer 01/08/2014  . Cataract    Past Surgical History:  Past Surgical History  Procedure Laterality Date  . Appendectomy  ~ 1970  . Cholecystectomy  ?1987  . Breast biopsy Right 1980's  . Colonoscopy  2002    Dr. Anwar--> Severe diverticular changes in the region of the sigmoid and descending colon with scattered diverticular changes throughout the rest of the colon. No polyps, ulcerations. Despite numerous manipulations, the tip of the scope could not be tipped into the cecal area.  . Colonoscopy  01/10/2012    Procedure: COLONOSCOPY;  Surgeon: Daneil Dolin, MD;  Location: [facility];  Service: Endoscopy;  Laterality: N/A;  1:55  . Coronary angioplasty with stent placement  ~ 1997 X 2    "2 + 1"  . Cardiac catheterization  01/07/2014  . Abdominal hysterectomy  1986    ovaries remain - prolaspe uterus   . Breast lumpectomy Right 1980's    Dr. Charlynne Pander   . Eye surgery Bilateral 2014    cataract  . Left heart catheterization with coronary angiogram N/A 01/07/2014    Procedure: LEFT HEART CATHETERIZATION WITH CORONARY ANGIOGRAM;  Surgeon: Larey Dresser, MD;  Location: [facility] CATH LAB;  Service: Cardiovascular;  Laterality: N/A;   HPI:  64 y.o. female with PMH significant for Diabetes uncontrolled, PVD,  CAD, HTN, HLD, stroke who presents with slurred speech and left side numbness. She presents to ED as a code stroke. Her neurological symptoms improved in the ED. TPA was not given. She related that around 9:45, she was on the phone with her sisterand her speech with slurred and she was "speaking jibberish." CT head negative in ED.   Assessment / Plan / Recommendation Clinical Impression   Pt appears to have speech/language and cognitive skills which are all WFL at this time; auditory comprehension [facility] and verbal expression  WFL at complex conversational level; speech 100% intelligible in conversation as well.    SLP Assessment  Patient does not need any further Speech Language Pathology Services    Follow Up Recommendations  None    Frequency and Duration   n/a        SLP Evaluation Prior Functioning  Cognitive/Linguistic Baseline: Within functional limits Type of Home: House  Lives With: Spouse Available Help at Discharge: Family;Friend(s);Available 24 hours/day Education: Obtained GED  Vocation: Retired   Associate Professor  Overall Cognitive Status: Within Functional Limits for tasks assessed Arousal/Alertness: Awake/alert Orientation Level: Oriented X4 Memory: Appears intact Awareness: Appears intact Problem Solving: Appears intact Safety/Judgment: Appears intact    Comprehension  Auditory Comprehension Overall Auditory Comprehension: Appears within functional limits for tasks assessed Yes/No Questions: Within Functional Limits Commands: Within Functional Limits Conversation: Complex Visual Recognition/Discrimination Discrimination: Within Function Limits Reading Comprehension Reading Status: Within funtional limits    Expression Expression Primary Mode of Expression: Verbal Verbal Expression Overall Verbal Expression: Appears within functional limits for tasks assessed Level of Generative/Spontaneous Verbalization: Conversation Repetition: No impairment Naming: No impairment Pragmatics: No impairment Non-Verbal Means of Communication: Not applicable Written Expression Dominant Hand: Right Written Expression: Not tested   Oral / Motor Oral Motor/Sensory Function Overall Oral Motor/Sensory Function: Mild impairment Facial Symmetry: Abnormal symmetry left (very slight) Motor Speech Overall Motor Speech: Appears within functional limits for tasks assessed Respiration: Within functional limits Phonation: Normal Resonance: Within functional limits Articulation: Within functional  limitis Intelligibility: Intelligible Motor Planning: Witnin functional limits Motor Speech Errors: Not applicable    ADAMS,PAT, M.S., CCC-SLP 09/02/2015, 10:53 AM

## 2015-09-02 NOTE — Progress Notes (Signed)
PT Cancellation Note  Patient Details Name: [name] MRN: [id number] DOB: [birth year]   Cancelled Treatment:    Reason Eval/Treat Not Completed: PT screened, no needs identified, will sign off   Kacy Hegna 09/02/2015, 2:29 PM Pager [phone]

## 2015-09-02 NOTE — Progress Notes (Signed)
Occupational Therapy Evaluation Patient Details Name: [name] MRN: [id number] DOB: Apr 08, 1951 Today's Date: 09/02/2015    History of Present Illness [name] is a 64 y.o. female with PMH significant for Diabetes uncontrolled, PVD, CAD, HTN, HLD, stroke who presents with slurred speech and left side numbness. MRI brain negative for acute infarct.   Clinical Impression   Patient presents to OT with symptoms resolved. She is independent with BADLs and ADL transfers. She ambulates around the room without AD without difficulty. No further OT needs. OT will sign off.    Follow Up Recommendations  No OT follow up    Equipment Recommendations  None recommended by OT    Recommendations for Other Services PT consult     Precautions / Restrictions Precautions Precautions: None Restrictions Weight Bearing Restrictions: No      Mobility Bed Mobility               General bed mobility comments: standing at sink bathing upon my arrival  Transfers Overall transfer level: Independent Equipment used: None                  Balance                                            ADL Overall ADL's : Independent                                       General ADL Comments: observed patient with bathing, donning hospital gown, changing socks, donning robe, toilet transfer, shaving her legs -- all at independent level.     Vision     Perception     Praxis      Pertinent Vitals/Pain Pain Assessment: No/denies pain     Hand Dominance Right   Extremity/Trunk Assessment Upper Extremity Assessment Upper Extremity Assessment: Overall WFL for tasks assessed   Lower Extremity Assessment Lower Extremity Assessment: Defer to PT evaluation       Communication Communication Communication: No difficulties   Cognition Arousal/Alertness: Awake/alert Behavior During Therapy: WFL for tasks assessed/performed Overall Cognitive  Status: Within Functional Limits for tasks assessed                     General Comments       Exercises       Shoulder Instructions      Home Living Family/patient expects to be discharged to:: Private residence Living Arrangements: Spouse/significant other Available Help at Discharge: Family;Friend(s);Available 24 hours/day Type of Home: House Home Access: Stairs to enter [employer] of Steps: 2   Home Layout: One level     Bathroom Shower/Tub: [name] years/pre: Standard     Home Equipment: Environmental consultant - 2 wheels;Cane - single point;Electric scooter;Wheelchair - manual   Additional Comments: Patient reports none of the equipment is used by her, but her husband occasionally uses the walker.  Lives With: Spouse    Prior Functioning/Environment Level of Independence: Independent             OT Diagnosis: Other (comment) (r/o stroke)   OT Problem List: Other (comment) (had LUE numbness that has resolved)   OT Treatment/Interventions:      OT Goals(Current goals can be found in the care plan section)  Acute Rehab OT Goals Patient Stated Goal: none stated OT Goal Formulation: All assessment and education complete, DC therapy  OT Frequency:     Barriers to D/C:            Co-evaluation              End of Session    Activity Tolerance: Patient tolerated treatment well Patient left: in bed;with family/visitor present   Time: 1110-1125 OT Time Calculation (min): 15 min Charges:  OT General Charges $OT Visit: 1 Procedure OT Evaluation $Initial OT Evaluation Tier I: 1 Procedure G-Codes: OT G-codes **NOT FOR INPATIENT CLASS** Functional Assessment Tool Used: clinical judgment Functional Limitation: Self care Self Care Current Status [id number]): 0 percent impaired, limited or restricted Self Care Goal Status [id number]): 0 percent impaired, limited or restricted Self Care Discharge Status [id number]): 0 percent impaired, limited  or restricted  [name] A 09/02/2015, 11:32 AM

## 2015-09-03 LAB — HEMOGLOBIN A1C
Hgb A1c MFr Bld: 9.3 % — ABNORMAL HIGH (ref 4.8–5.6)
MEAN PLASMA GLUCOSE: 220 mg/dL

## 2015-09-06 ENCOUNTER — Telehealth: Payer: Self-pay | Admitting: *Deleted

## 2015-09-06 NOTE — Telephone Encounter (Signed)
   Call Completed and Appointment Scheduled: Yes, Date: 09/08/15 Dr Anabel Halon INFORMATION Date of Discharge:09/02/15  Discharge Facility: [facility]  Principal Discharge Diagnosis: Stroke  Patient and/or caregiver is knowledgeable of his/her condition(s) and treatment: Yes   MEDICATION RECONCILIATION Medication list reviewed with patient: Yes  Patient is able to obtain needed medications: Yes   ACTIVITIES OF DAILY LIVING  Is the patient able to perform his/her own ADLs: Yes  Patient is receiving home health services: no   PATIENT EDUCATION Questions/Concerns Discussed: Patient has noticed some fatigue and slowed cognition but over all is feeling better. She is performing her ADLs without difficulty and isn't having any trouble with her medications. She will call if she needs anything before her scheduled appt.

## 2015-09-07 ENCOUNTER — Ambulatory Visit: Payer: Self-pay | Admitting: Pharmacist

## 2015-09-08 ENCOUNTER — Ambulatory Visit: Payer: Commercial Managed Care - HMO | Admitting: Family Medicine

## 2015-09-28 ENCOUNTER — Other Ambulatory Visit: Payer: Self-pay | Admitting: Family Medicine

## 2015-09-28 NOTE — Telephone Encounter (Signed)
Last seen 07/08/15 DW[facility]  If approved route to nurse to call into [facility]

## 2015-09-29 ENCOUNTER — Ambulatory Visit: Payer: Commercial Managed Care - HMO | Admitting: Family Medicine

## 2015-10-13 ENCOUNTER — Ambulatory Visit: Payer: Commercial Managed Care - HMO | Admitting: Nurse Practitioner

## 2015-10-20 ENCOUNTER — Ambulatory Visit: Payer: Commercial Managed Care - HMO | Admitting: Family Medicine

## 2015-10-31 ENCOUNTER — Other Ambulatory Visit: Payer: Self-pay | Admitting: Family Medicine

## 2015-10-31 NOTE — Telephone Encounter (Signed)
rx called into pharmacy

## 2015-10-31 NOTE — Telephone Encounter (Signed)
Please call in xanax with 1 refills 

## 2015-10-31 NOTE — Telephone Encounter (Signed)
Last filled 09/28/15, last seen by Laurance Flatten on 07/07/16, [facility]

## 2015-11-04 ENCOUNTER — Ambulatory Visit: Payer: Commercial Managed Care - HMO | Admitting: Pharmacist

## 2015-11-07 ENCOUNTER — Encounter: Payer: Self-pay | Admitting: Family Medicine

## 2015-11-09 ENCOUNTER — Telehealth: Payer: Self-pay | Admitting: *Deleted

## 2015-11-09 MED ORDER — AMOXICILLIN-POT CLAVULANATE 875-125 MG PO TABS: 1.0000 | ORAL_TABLET | Freq: Two times a day (BID) | ORAL | Status: DC

## 2015-11-09 NOTE — Telephone Encounter (Signed)
Augmentin 875 #20, 1 twice daily with food for infection until completed. Also use nasal saline frequently through the day each nostril

## 2015-11-09 NOTE — Telephone Encounter (Signed)
Pt aware.

## 2015-11-09 NOTE — Telephone Encounter (Signed)
Dup encounter.

## 2015-11-09 NOTE — Telephone Encounter (Signed)
Pt is requesting something be sent into [facility] in [place], she thinks she has a sinus infection and she has been at [facility] with her husband who is dying and she can't come in. Please advise

## 2015-11-10 ENCOUNTER — Other Ambulatory Visit: Payer: Self-pay | Admitting: Family Medicine

## 2015-11-10 ENCOUNTER — Ambulatory Visit (INDEPENDENT_AMBULATORY_CARE_PROVIDER_SITE_OTHER): Payer: Commercial Managed Care - HMO | Admitting: Nurse Practitioner

## 2015-11-10 ENCOUNTER — Encounter: Payer: Self-pay | Admitting: Nurse Practitioner

## 2015-11-10 VITALS — BP 121/73 | HR 82 | Temp 98.2°F | Ht 64.0 in | Wt 181.0 lb

## 2015-11-10 DIAGNOSIS — R112 Nausea with vomiting, unspecified: Secondary | ICD-10-CM | POA: Diagnosis not present

## 2015-11-10 DIAGNOSIS — J101 Influenza due to other identified influenza virus with other respiratory manifestations: Secondary | ICD-10-CM

## 2015-11-10 LAB — POCT INFLUENZA A/B
INFLUENZA A, POC: NEGATIVE
INFLUENZA B, POC: POSITIVE — AB

## 2015-11-10 MED ORDER — ONDANSETRON 4 MG PO TBDP: 4.0000 mg | ORAL_TABLET | Freq: Three times a day (TID) | ORAL | Status: DC | PRN

## 2015-11-10 MED ORDER — OSELTAMIVIR PHOSPHATE 75 MG PO CAPS: 75.0000 mg | ORAL_CAPSULE | Freq: Two times a day (BID) | ORAL | Status: DC

## 2015-11-10 NOTE — Progress Notes (Signed)
   Subjective:    Patient ID: [name], female    DOB: 07/31/1951, 65 y.o.   MRN: [id number]  HPI  Patient brought in by son with c/o fever, bodyaches and nausea and vomiting. She has not been able to keep anything down. Her husband is dying and she does not want to be away from him. She had dr. Laurance Flatten call in amoxicillin in for her 2 days ago biut it has not helped.  Review of Systems  Constitutional: Positive for fever and chills.  HENT: Positive for congestion.   Respiratory: Positive for cough.   Cardiovascular: Negative.   Gastrointestinal: Positive for nausea and vomiting.  Genitourinary: Negative.   Neurological: Negative.   Psychiatric/Behavioral: Negative.   All other systems reviewed and are negative.      Objective:   Physical Exam  Constitutional: She appears well-developed and well-nourished.  HENT:  Right Ear: Hearing, tympanic membrane, external ear and ear canal normal.  Left Ear: Hearing, tympanic membrane, external ear and ear canal normal.  Nose: Mucosal edema and rhinorrhea present.  Eyes: Pupils are equal, round, and reactive to light.  Neck: Normal range of motion. Neck supple.  Cardiovascular: Normal rate, regular rhythm and normal heart sounds.   Pulmonary/Chest: Effort normal and breath sounds normal.  Abdominal: Soft.  Skin: Skin is warm and dry. She is not diaphoretic. There is pallor.  Cheeks flushed  Psychiatric: She has a normal mood and affect. Her behavior is normal. Judgment and thought content normal.   BP 121/73 mmHg  Pulse 82  Temp(Src) 98.2 F (36.8 C) (Oral)  Ht 5\' 4"  (1.626 m)  Wt 181 lb (82.101 kg)  BMI 31.05 kg/m2        Assessment & Plan:   1. Nausea and vomiting, intractability of vomiting not specified, unspecified vomiting type   2. Influenza B    Meds ordered this encounter  Medications  . oseltamivir (TAMIFLU) 75 MG capsule    Sig: Take 1 capsule (75 mg total) by mouth 2 (two) times daily.    Dispense:  10  capsule    Refill:  0    Order Specific Question:  Supervising Provider    Answer:  Chipper Herb [[id number]]  . ondansetron (ZOFRAN ODT) 4 MG disintegrating tablet    Sig: Take 1 tablet (4 mg total) by mouth every 8 (eight) hours as needed for nausea or vomiting.    Dispense:  20 tablet    Refill:  0    Order Specific Question:  Supervising Provider    Answer:  Joycelyn Man   Force fluids  Rest Motrin or tylenol OTC for fever  Mary-Margaret Hassell Done, FNP

## 2015-11-10 NOTE — Patient Instructions (Signed)

## 2015-11-28 ENCOUNTER — Other Ambulatory Visit: Payer: Self-pay | Admitting: Nurse Practitioner

## 2015-11-28 ENCOUNTER — Telehealth: Payer: Self-pay | Admitting: Pharmacist

## 2015-11-28 ENCOUNTER — Encounter: Payer: Self-pay | Admitting: Pharmacist

## 2015-11-28 ENCOUNTER — Ambulatory Visit (INDEPENDENT_AMBULATORY_CARE_PROVIDER_SITE_OTHER): Payer: Commercial Managed Care - HMO | Admitting: Pharmacist

## 2015-11-28 DIAGNOSIS — E1165 Type 2 diabetes mellitus with hyperglycemia: Secondary | ICD-10-CM

## 2015-11-28 DIAGNOSIS — IMO0002 Reserved for concepts with insufficient information to code with codable children: Secondary | ICD-10-CM

## 2015-11-28 DIAGNOSIS — Z794 Long term (current) use of insulin: Secondary | ICD-10-CM | POA: Diagnosis not present

## 2015-11-28 DIAGNOSIS — E785 Hyperlipidemia, unspecified: Secondary | ICD-10-CM

## 2015-11-28 MED ORDER — INSULIN ASPART 100 UNIT/ML FLEXPEN: 35.0000 [IU] | PEN_INJECTOR | Freq: Three times a day (TID) | SUBCUTANEOUS | Status: DC

## 2015-11-28 MED ORDER — INSULIN GLARGINE 300 UNIT/ML ~~LOC~~ SOPN: 80.0000 [IU] | PEN_INJECTOR | Freq: Every day | SUBCUTANEOUS | Status: DC

## 2015-11-28 NOTE — Progress Notes (Signed)
Diabetes Follow-Up Visit Chief Complaint:   No chief complaint on file.   HPI: Patient is here today to follow up uncontrolled type 2 diabetes.  She has recently lost her husband in the last 2 weeks.  During the time that her husband was sick and with Hospice she has the flu and this added more stress as there was a period of time that she was unable to see him.   Patient had an insulin pump for about 1 year but in July of 2015 her insurance stopped paying for pump supplies and patient was switched insulin pens.   She is also currently using Levemir 35 units twice daily and Humalog 30 to 35 units prior to meals to control BG.  She was told today be her pharmacy that Humalog is no longer covered by insurance.  She has been out of Humalog for last 36 hours.  She has taken Invokana 300mg  1 tablet daily in the past but she stopped because she was concerned about side effects she saw on TV.  Has not taken in 3 weeks. She has also taken Trulicity but copay was too high.  Patient has tried metformin regular and XR and very low dose but has not been able to tolerate in the past due to diarrhea and nausea.   Home BG Monitoring:  Checking up to 3 times a day.  14 day avg = 293 30 day avg = 300 90 day avg =328 Range 64 - 588   Patient has keto sticks at home and tests when BG greater than 400.  She has not had any positive urine ketone readings.  Exam Edema:  negative  Polyuria:  Negative Polydipsia:  Negative             Polyphagia:  negative   Edema - trace bilaterally  Weight changes:  stable General Appearance:  alert, oriented, no acute distress and obese Mood/Affect:  normal  Low fat/carbohydrate diet?  Yes - continues to limit CHO portions sizes  Nicotine Abuse?  No Medication Compliance?  Yes Exercise?  No Alcohol Abuse?  No   Lab Results  Component Value Date   HGBA1C 9.3* 09/02/2015        HGBA1C               10.2% (04/06/2015)  Lipids panel from 07/08/2015 LDL = 65 Tg =  134 Total choletserol = 124 HDL = 32 LDL-P = 1280   Assessment: 1.  Diabetes.  Uncontrolled 2.  Dyslipidemia - LDL and triglyerides at goal and HDL low. Patient on statin.   Recommendations: 1.  Medication recommendations at this time are as follows:    Change Levemir to Toujeo (she has several pens left from husband) - inject 80 units once daily  Change Humalog to Novolog (due to ins preference) 30 units tid prior to meal and 10 units as needed for BG greater than 300.  Patient to call if gets more than 1 BG reading greater than 300 for insulin adjustment    2.  Revewed HBG goals:  Fasting 80-130 and 1-2 hour post prandial <180.  Patient is instructed to check BG 3 to 5  times per day.   3.  BP goal < 140/80.  4.  Return to clinic in 2 days to see PCP and have labs drawn.  RTC to see me in 6 weeks.   Time spent counseling patient:  30 minutes   Cherre Robins, PharmD, CPP, CDE

## 2015-11-28 NOTE — Telephone Encounter (Signed)
Patient is coming in today for appt. today

## 2015-11-29 LAB — HM DIABETES EYE EXAM

## 2015-11-30 ENCOUNTER — Encounter: Payer: Self-pay | Admitting: Family Medicine

## 2015-11-30 ENCOUNTER — Ambulatory Visit (INDEPENDENT_AMBULATORY_CARE_PROVIDER_SITE_OTHER): Payer: Commercial Managed Care - HMO | Admitting: Family Medicine

## 2015-11-30 VITALS — BP 111/63 | HR 84 | Temp 98.0°F | Ht 64.0 in | Wt 175.6 lb

## 2015-11-30 DIAGNOSIS — I70213 Atherosclerosis of native arteries of extremities with intermittent claudication, bilateral legs: Secondary | ICD-10-CM | POA: Diagnosis not present

## 2015-11-30 DIAGNOSIS — I25119 Atherosclerotic heart disease of native coronary artery with unspecified angina pectoris: Secondary | ICD-10-CM

## 2015-11-30 DIAGNOSIS — K219 Gastro-esophageal reflux disease without esophagitis: Secondary | ICD-10-CM

## 2015-11-30 DIAGNOSIS — F411 Generalized anxiety disorder: Secondary | ICD-10-CM | POA: Diagnosis not present

## 2015-11-30 DIAGNOSIS — I779 Disorder of arteries and arterioles, unspecified: Secondary | ICD-10-CM

## 2015-11-30 DIAGNOSIS — I739 Peripheral vascular disease, unspecified: Secondary | ICD-10-CM

## 2015-11-30 DIAGNOSIS — Z794 Long term (current) use of insulin: Secondary | ICD-10-CM

## 2015-11-30 DIAGNOSIS — I10 Essential (primary) hypertension: Secondary | ICD-10-CM

## 2015-11-30 DIAGNOSIS — E1169 Type 2 diabetes mellitus with other specified complication: Secondary | ICD-10-CM | POA: Diagnosis not present

## 2015-11-30 DIAGNOSIS — E785 Hyperlipidemia, unspecified: Secondary | ICD-10-CM | POA: Diagnosis not present

## 2015-11-30 DIAGNOSIS — E1165 Type 2 diabetes mellitus with hyperglycemia: Secondary | ICD-10-CM

## 2015-11-30 DIAGNOSIS — E559 Vitamin D deficiency, unspecified: Secondary | ICD-10-CM

## 2015-11-30 DIAGNOSIS — E876 Hypokalemia: Secondary | ICD-10-CM | POA: Diagnosis not present

## 2015-11-30 DIAGNOSIS — IMO0002 Reserved for concepts with insufficient information to code with codable children: Secondary | ICD-10-CM

## 2015-11-30 LAB — MICROSCOPIC EXAMINATION: Epithelial Cells (non renal): >10 /hpf — ABNORMAL HIGH (ref 0–10)

## 2015-11-30 LAB — URINALYSIS, COMPLETE
Bilirubin, UA: NEGATIVE
Ketones, UA: NEGATIVE
LEUKOCYTES UA: NEGATIVE
Nitrite, UA: NEGATIVE
PH UA: 7 (ref 5.0–7.5)
RBC, UA: NEGATIVE
Specific Gravity, UA: 1.025 (ref 1.005–1.030)
UUROB: 4 mg/dL — AB (ref 0.2–1.0)

## 2015-11-30 LAB — BAYER DCA HB A1C WAIVED: HB A1C: 9.4 % — AB (ref <7.0)

## 2015-11-30 MED ORDER — ATENOLOL 50 MG PO TABS: 50.0000 mg | ORAL_TABLET | Freq: Two times a day (BID) | ORAL | Status: DC

## 2015-11-30 MED ORDER — POTASSIUM CHLORIDE CRYS ER 20 MEQ PO TBCR: 20.0000 meq | EXTENDED_RELEASE_TABLET | Freq: Two times a day (BID) | ORAL | Status: DC

## 2015-11-30 MED ORDER — ZOLPIDEM TARTRATE 10 MG PO TABS: ORAL_TABLET | ORAL | Status: DC

## 2015-11-30 MED ORDER — LISINOPRIL 40 MG PO TABS: 40.0000 mg | ORAL_TABLET | Freq: Every day | ORAL | Status: DC

## 2015-11-30 MED ORDER — CLOPIDOGREL BISULFATE 75 MG PO TABS: 75.0000 mg | ORAL_TABLET | Freq: Every day | ORAL | Status: DC

## 2015-11-30 MED ORDER — FUROSEMIDE 40 MG PO TABS: 40.0000 mg | ORAL_TABLET | Freq: Two times a day (BID) | ORAL | Status: DC

## 2015-11-30 MED ORDER — ALPRAZOLAM 0.5 MG PO TABS: ORAL_TABLET | ORAL | Status: DC

## 2015-11-30 MED ORDER — ISOSORBIDE MONONITRATE ER 120 MG PO TB24: 120.0000 mg | ORAL_TABLET | Freq: Every day | ORAL | Status: DC

## 2015-11-30 MED ORDER — CITALOPRAM HYDROBROMIDE 20 MG PO TABS: 20.0000 mg | ORAL_TABLET | Freq: Every day | ORAL | Status: DC

## 2015-11-30 MED ORDER — ROSUVASTATIN CALCIUM 40 MG PO TABS: 40.0000 mg | ORAL_TABLET | Freq: Every day | ORAL | Status: DC

## 2015-11-30 NOTE — Progress Notes (Signed)
Subjective:    Patient ID: [name], female    DOB: 10-02-1950, 65 y.o.   MRN: [id number]  HPI  Patient is here today for a follow up on her chronic medical problems. Patient is complaining with back pain from approximately the middle of her back down. She also has some sinus draining. The patient is doing well overall today. She unfortunately just lost her husband who been sick for many years to metastatic cancer. She is handling herself well in spite of this. She does complain of some sinus pressure today and some back pain. She is due to get a Pap smear and pelvic exam and this will be scheduled for her if she leaves office. She is due a DEXA scan and this will be arranged for her at her next visit. She needs prescriptions for all her medicines. The patient has been hurting in her back for 4-5 days and does not recall any thing that she did specifically that caused this pain. She was sick during her husband's demise and is just beginning to be more active and feels stronger since then. She is has some nasal congestion off and on for several weeks. She denies any chest pain or shortness of breath. She has no trouble with swallowing her food heartburn indigestion nausea vomiting diarrhea or blood in the stool. She denies any abdominal pain. She is not having any problems passing her water and denies blood in the urine. She does say she walks any distance that she does have some claudication symptoms in her legs.  Review of Systems  Constitutional: Negative.   HENT: Positive for sinus pressure.   Eyes: Negative.   Respiratory: Negative.   Cardiovascular: Negative.   Gastrointestinal: Negative.   Endocrine: Negative.   Genitourinary: Negative.   Musculoskeletal: Positive for back pain.  Skin: Negative.   Allergic/Immunologic: Negative.   Neurological: Negative.   Hematological: Negative.   Psychiatric/Behavioral: Negative.           Patient Active Problem List   Diagnosis Date  Noted  . Anxiety state   . Esophageal reflux   . Facial droop 09/01/2015  . Stroke ([place]) 09/01/2015  . TIA (transient ischemic attack) 08/14/2014  . DM type 2 with diabetic dyslipidemia ([place]) 08/14/2014  . Cataract 02/08/2014  . Macular degeneration 02/08/2014  . Elevated d-dimer, neg VQ scan 01/08/2014  . Unstable angina ([place]) 01/07/2014  . Chest pain 12/30/2013  . Generalized anxiety disorder 09/14/2013  . Claudication ([place]) 06/18/2013  . Diverticulosis of colon 01/01/2012  . Left lower quadrant pain 01/01/2012  . GERD (gastroesophageal reflux disease) 01/01/2012  . Constipation 01/01/2012  . Hypokalemia 08/24/2011  . Uncontrolled type 2 diabetes mellitus with insulin therapy ([place]) 08/23/2011  . Essential hypertension, benign 08/23/2011  . PALPITATIONS 02/10/2010  . Hyperlipidemia 05/10/2009  . DEPRESSION 05/10/2009  . CAD, NATIVE VESSEL, cath 01/07/14 non obstructive coronary disease 08/26/2008  . 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 08/26/2008   Outpatient Encounter Prescriptions as of 11/30/2015  Medication Sig  . ALPRAZolam (XANAX) 0.5 MG tablet TAKE ONE TABLET 3 TIMES A DAY AS NEEDED.  Marland Kitchen aspirin EC 325 MG tablet Take 325 mg by mouth daily.  Marland Kitchen atenolol (TENORMIN) 50 MG tablet Take 1 tablet (50 mg total) by mouth 2 (two) times daily.  . citalopram (CELEXA) 20 MG tablet Take 1 tablet (20 mg total) by mouth daily.  . clopidogrel (PLAVIX) 75 MG tablet Take 1 tablet (75 mg total) by mouth daily.  . Dulaglutide (  TRULICITY) 0.17 BL/3.9QZ SOPN Inject 0.75 mg into the skin once a week.  . furosemide (LASIX) 40 MG tablet Take 1 tablet (40 mg total) by mouth 2 (two) times daily.  . insulin aspart (NOVOLOG FLEXPEN) 100 UNIT/ML FlexPen Inject 35 Units into the skin 3 (three) times daily with meals.  . Insulin Glargine (TOUJEO SOLOSTAR) 300 UNIT/ML SOPN Inject 80 Units into the skin daily.  . isosorbide mononitrate (IMDUR) 120 MG 24 hr tablet Take 1 tablet (120 mg total) by mouth daily.  Marland Kitchen  lisinopril (PRINIVIL,ZESTRIL) 40 MG tablet Take 1 tablet (40 mg total) by mouth daily.  . nitroGLYCERIN (NITROSTAT) 0.4 MG SL tablet Place 1 tablet (0.4 mg total) under the tongue every 5 (five) minutes as needed. For chest pain  . potassium chloride SA (K-DUR,KLOR-CON) 20 MEQ tablet Take 1 tablet (20 mEq total) by mouth 2 (two) times daily.  . rosuvastatin (CRESTOR) 40 MG tablet Take 1 tablet (40 mg total) by mouth daily.  . vitamin B-12 (CYANOCOBALAMIN) 1000 MCG tablet Take 1,000 mcg by mouth daily.  . Vitamin D, Ergocalciferol, (DRISDOL) 50000 UNITS CAPS capsule Take 1 capsule (50,000 Units total) by mouth every 7 (seven) days.  Marland Kitchen zolpidem (AMBIEN) 10 MG tablet TAKE 1/2 TO 1 TABLET AT BEDTIME AS NEEDED  . [DISCONTINUED] ALPRAZolam (XANAX) 0.5 MG tablet TAKE ONE TABLET 3 TIMES A DAY AS NEEDED.  . [DISCONTINUED] atenolol (TENORMIN) 50 MG tablet TAKE ONE TABLET BY MOUTH TWICE DAILY  . [DISCONTINUED] citalopram (CELEXA) 20 MG tablet Take 1 tablet (20 mg total) by mouth daily.  . [DISCONTINUED] clopidogrel (PLAVIX) 75 MG tablet Take 1 tablet (75 mg total) by mouth daily.  . [DISCONTINUED] furosemide (LASIX) 40 MG tablet Take 1 tablet (40 mg total) by mouth 2 (two) times daily.  . [DISCONTINUED] isosorbide mononitrate (IMDUR) 120 MG 24 hr tablet Take 1 tablet (120 mg total) by mouth daily.  . [DISCONTINUED] lisinopril (PRINIVIL,ZESTRIL) 40 MG tablet Take 1 tablet (40 mg total) by mouth daily.  . [DISCONTINUED] potassium chloride SA (K-DUR,KLOR-CON) 20 MEQ tablet Take 1 tablet (20 mEq total) by mouth 2 (two) times daily.  . [DISCONTINUED] rosuvastatin (CRESTOR) 40 MG tablet Take 1 tablet (40 mg total) by mouth daily.  . [DISCONTINUED] zolpidem (AMBIEN) 10 MG tablet TAKE 1/2 TO 1 TABLET AT BEDTIME AS NEEDED   No facility-administered encounter medications on file as of 11/30/2015.       Objective:   Physical Exam  Constitutional: She is oriented to person, place, and time. She appears well-developed  and well-nourished. No distress.  HENT:  Head: Normocephalic and atraumatic.  Right Ear: External ear normal.  Left Ear: External ear normal.  Mouth/Throat: Oropharynx is clear and moist. No oropharyngeal exudate.  Slight nasal congestion  Eyes: Conjunctivae and EOM are normal. Pupils are equal, round, and reactive to light. Right eye exhibits no discharge. Left eye exhibits no discharge. No scleral icterus.  Neck: Normal range of motion. Neck supple. No thyromegaly present.  She has a history of carotid stenosis but no audible bruits were heard today.  Cardiovascular: Normal rate, regular rhythm, normal heart sounds and intact distal pulses.   No murmur heard. The heart has a regular rate and rhythm at 72/m  Pulmonary/Chest: Effort normal and breath sounds normal. No respiratory distress. She has no wheezes. She has no rales. She exhibits no tenderness.  Clear anteriorly and posteriorly  Abdominal: Soft. Bowel sounds are normal. She exhibits no mass. There is no tenderness. There is no  rebound and no guarding.  Abdominal scars from previous surgeries but no abdominal tenderness or organ enlargement and she does have good inguinal pulses.  Musculoskeletal: Normal range of motion. She exhibits no edema.  Lymphadenopathy:    She has no cervical adenopathy.  Neurological: She is alert and oriented to person, place, and time. She has normal reflexes. No cranial nerve deficit.  Skin: Skin is warm and dry. No rash noted.  Psychiatric: She has a normal mood and affect. Her behavior is normal. Judgment and thought content normal.  The patient seems to be handling the death of her husband well at this point in time she was somewhat tearful at times during the visit talking about him.  Nursing note and vitals reviewed.  BP 111/63 mmHg  Pulse 84  Temp(Src) 98 F (36.7 C) (Oral)  Ht 5' 4"  (1.626 m)  Wt 175 lb 9.6 oz (79.652 kg)  BMI 30.13 kg/m2        Assessment & Plan:  1. Uncontrolled  type 2 diabetes mellitus with insulin therapy ([place]) -The patient should continue to follow-up with the clinical pharmacists until she gets her blood sugar under the best control. - Bayer DCA Hb A1c Waived - Urinalysis, Complete  2. Hyperlipemia - continue with aggressive therapeutic lifestyle changes and current medication to get the cholesterol in the best control.    3. Essential hypertension, benign -Continue to watch sodium intake and stay active - BMP8+EGFR - CBC with Differential/Platelet - Urinalysis, Complete  4. Vitamin D deficiency -Continue current treatment pending results of lab work  5. Hyperlipidemia -Continue current treatment pending results of lab work  6. Gastroesophageal reflux disease, esophagitis presence not specified -Avoid anti-inflammatory medicines and take only extra strength Tylenol if needed for aches and pains  7. Generalized anxiety disorder -Continue with current treatment pending results of lab work  8. DM type 2 with diabetic dyslipidemia ([place]) -Continue exercise and current treatment and follow-up with clinical pharmacy as planned  9. Atherosclerosis of native coronary artery of native heart with angina pectoris [facility]) -Follow-up with current cardiologist as planned  10. Carotid artery disease, unspecified laterality ([place]) -Continue to take adult aspirin and Plavix  11. Atherosclerosis of native artery of both lower extremities with intermittent claudication ([place]) -Continue aggressive therapeutic lifestyle changes statin drug and walking and exercise as much as possible  Meds ordered this encounter  Medications  . atenolol (TENORMIN) 50 MG tablet    Sig: Take 1 tablet (50 mg total) by mouth 2 (two) times daily.    Dispense:  180 tablet    Refill:  2  . DISCONTD: citalopram (CELEXA) 20 MG tablet    Sig: Take 1 tablet (20 mg total) by mouth daily.    Dispense:  90 tablet    Refill:  0  . clopidogrel (PLAVIX) 75 MG tablet    Sig: Take  1 tablet (75 mg total) by mouth daily.    Dispense:  90 tablet    Refill:  3  . furosemide (LASIX) 40 MG tablet    Sig: Take 1 tablet (40 mg total) by mouth 2 (two) times daily.    Dispense:  180 tablet    Refill:  3  . isosorbide mononitrate (IMDUR) 120 MG 24 hr tablet    Sig: Take 1 tablet (120 mg total) by mouth daily.    Dispense:  30 tablet    Refill:  1  . lisinopril (PRINIVIL,ZESTRIL) 40 MG tablet    Sig: Take 1 tablet (  40 mg total) by mouth daily.    Dispense:  90 tablet    Refill:  1  . potassium chloride SA (K-DUR,KLOR-CON) 20 MEQ tablet    Sig: Take 1 tablet (20 mEq total) by mouth 2 (two) times daily.    Dispense:  180 tablet    Refill:  1    Please note increase in dose from qd to bid  . rosuvastatin (CRESTOR) 40 MG tablet    Sig: Take 1 tablet (40 mg total) by mouth daily.    Dispense:  90 tablet    Refill:  4  . DISCONTD: zolpidem (AMBIEN) 10 MG tablet    Sig: TAKE 1/2 TO 1 TABLET AT BEDTIME AS NEEDED    Dispense:  90 tablet    Refill:  0  . DISCONTD: ALPRAZolam (XANAX) 0.5 MG tablet    Sig: TAKE ONE TABLET 3 TIMES A DAY AS NEEDED.    Dispense:  90 tablet    Refill:  1  . citalopram (CELEXA) 20 MG tablet    Sig: Take 1 tablet (20 mg total) by mouth daily.    Dispense:  90 tablet    Refill:  1  . ALPRAZolam (XANAX) 0.5 MG tablet    Sig: TAKE ONE TABLET 3 TIMES A DAY AS NEEDED.    Dispense:  90 tablet    Refill:  1  . zolpidem (AMBIEN) 10 MG tablet    Sig: TAKE 1/2 TO 1 TABLET AT BEDTIME AS NEEDED    Dispense:  90 tablet    Refill:  1   Patient Instructions  The patient should stay active physically and continue to monitor blood pressures regularly She should walk regularly as this will help improve the circulation to her legs She should follow-up with the cardiologist as planned She should schedule the pelvic exam and Pap smear as she understands she needs to do We will wait until she comes back and arrange for her to have a DEXA scan sometime in May or  after She should check her blood sugars regularly and bring readings in for review to the clinical pharmacists at their next meeting She should talk to her family about getting a Lifeline monitor so they do not worry about her is much She should use nasal saline frequently through the day for the nasal congestion and nasal saline gel at nighttime as directed on the handout sheet She should take extra strength Tylenol for the back pain and use warm wet compresses 20 minutes 3 or 4 times daily   Arrie Senate MD

## 2015-11-30 NOTE — Patient Instructions (Addendum)
The patient should stay active physically and continue to monitor blood pressures regularly She should walk regularly as this will help improve the circulation to her legs She should follow-up with the cardiologist as planned She should schedule the pelvic exam and Pap smear as she understands she needs to do We will wait until she comes back and arrange for her to have a DEXA scan sometime in May or after She should check her blood sugars regularly and bring readings in for review to the clinical pharmacists at their next meeting She should talk to her family about getting a Lifeline monitor so they do not worry about her is much She should use nasal saline frequently through the day for the nasal congestion and nasal saline gel at nighttime as directed on the handout sheet She should take extra strength Tylenol for the back pain and use warm wet compresses 20 minutes 3 or 4 times daily

## 2015-12-01 LAB — HEPATIC FUNCTION PANEL
ALK PHOS: 42 IU/L (ref 39–117)
ALT: 9 IU/L (ref 0–32)
AST: 19 IU/L (ref 0–40)
Albumin: 4 g/dL (ref 3.6–4.8)
Bilirubin Total: 0.5 mg/dL (ref 0.0–1.2)
Bilirubin, Direct: 0.22 mg/dL (ref 0.00–0.40)
Total Protein: 6.7 g/dL (ref 6.0–8.5)

## 2015-12-01 LAB — CBC WITH DIFFERENTIAL/PLATELET
BASOS ABS: 0 10*3/uL (ref 0.0–0.2)
Basos: 0 %
EOS (ABSOLUTE): 0.1 10*3/uL (ref 0.0–0.4)
Eos: 2 %
Hematocrit: 30 % — ABNORMAL LOW (ref 34.0–46.6)
Hemoglobin: 10 g/dL — ABNORMAL LOW (ref 11.1–15.9)
IMMATURE GRANS (ABS): 0 10*3/uL (ref 0.0–0.1)
Immature Granulocytes: 0 %
LYMPHS: 32 %
Lymphocytes Absolute: 2.5 10*3/uL (ref 0.7–3.1)
MCH: 29.9 pg (ref 26.6–33.0)
MCHC: 33.3 g/dL (ref 31.5–35.7)
MCV: 90 fL (ref 79–97)
MONOS ABS: 0.4 10*3/uL (ref 0.1–0.9)
Monocytes: 5 %
NEUTROS PCT: 61 %
Neutrophils Absolute: 4.6 10*3/uL (ref 1.4–7.0)
Platelets: 177 10*3/uL (ref 150–379)
RBC: 3.35 x10E6/uL — AB (ref 3.77–5.28)
RDW: 14.9 % (ref 12.3–15.4)
WBC: 7.6 10*3/uL (ref 3.4–10.8)

## 2015-12-01 LAB — BMP8+EGFR
BUN / CREAT RATIO: 8 — AB (ref 11–26)
BUN: 14 mg/dL (ref 8–27)
CALCIUM: 7.7 mg/dL — AB (ref 8.7–10.3)
CHLORIDE: 91 mmol/L — AB (ref 96–106)
CO2: 36 mmol/L — ABNORMAL HIGH (ref 18–29)
Creatinine, Ser: 1.72 mg/dL — ABNORMAL HIGH (ref 0.57–1.00)
GFR, EST AFRICAN AMERICAN: 35 mL/min/{1.73_m2} — AB (ref >59)
GFR, EST NON AFRICAN AMERICAN: 30 mL/min/{1.73_m2} — AB (ref >59)
Glucose: 144 mg/dL — ABNORMAL HIGH (ref 65–99)
POTASSIUM: 2.1 mmol/L — AB (ref 3.5–5.2)
Sodium: 147 mmol/L — ABNORMAL HIGH (ref 134–144)

## 2015-12-01 LAB — NMR, LIPOPROFILE
Cholesterol: 115 mg/dL (ref 100–199)
HDL CHOLESTEROL BY NMR: 35 mg/dL — AB (ref >39)
HDL PARTICLE NUMBER: 26.6 umol/L — AB (ref >=30.5)
LDL Particle Number: 906 nmol/L (ref <1000)
LDL SIZE: 20.5 nm (ref >20.5)
LDL-C: 50 mg/dL (ref 0–99)
LP-IR Score: 60 — ABNORMAL HIGH (ref <=45)
SMALL LDL PARTICLE NUMBER: 502 nmol/L (ref <=527)
TRIGLYCERIDES BY NMR: 149 mg/dL (ref 0–149)

## 2015-12-01 NOTE — Addendum Note (Signed)
Addended by: Nigel Berthold C on: 12/01/2015 10:12 AM   Modules accepted: Orders, SmartSet

## 2015-12-07 ENCOUNTER — Telehealth: Payer: Self-pay

## 2015-12-07 NOTE — Telephone Encounter (Signed)
There is a prescription that was sent 11/28/2015 for Novolog pens which indicates that Humalog is not longer covered and were are replacing with Novolog.  I was sent to [facility]

## 2015-12-07 NOTE — Telephone Encounter (Signed)
Insurance denied Humalog Sports administrator Flex Pen

## 2015-12-07 NOTE — Telephone Encounter (Signed)
Insurance denied Humalog Kwikpen   The drug is non formulary   Preferred drug is Novolog

## 2016-01-04 ENCOUNTER — Telehealth: Payer: Self-pay | Admitting: Family Medicine

## 2016-01-04 NOTE — Telephone Encounter (Signed)
Left VM mssg to call back. Going to offer pt to come by office to get a monitor from here

## 2016-01-12 ENCOUNTER — Encounter: Payer: Self-pay | Admitting: Pharmacist

## 2016-01-12 ENCOUNTER — Ambulatory Visit (INDEPENDENT_AMBULATORY_CARE_PROVIDER_SITE_OTHER): Payer: Commercial Managed Care - HMO | Admitting: Pharmacist

## 2016-01-12 ENCOUNTER — Encounter (INDEPENDENT_AMBULATORY_CARE_PROVIDER_SITE_OTHER): Payer: Self-pay

## 2016-01-12 VITALS — BP 112/60 | HR 78 | Ht 64.0 in | Wt 175.0 lb

## 2016-01-12 DIAGNOSIS — E785 Hyperlipidemia, unspecified: Secondary | ICD-10-CM

## 2016-01-12 DIAGNOSIS — E1165 Type 2 diabetes mellitus with hyperglycemia: Secondary | ICD-10-CM

## 2016-01-12 DIAGNOSIS — D649 Anemia, unspecified: Secondary | ICD-10-CM

## 2016-01-12 DIAGNOSIS — IMO0002 Reserved for concepts with insufficient information to code with codable children: Secondary | ICD-10-CM

## 2016-01-12 DIAGNOSIS — I10 Essential (primary) hypertension: Secondary | ICD-10-CM | POA: Diagnosis not present

## 2016-01-12 DIAGNOSIS — Z794 Long term (current) use of insulin: Secondary | ICD-10-CM

## 2016-01-12 MED ORDER — INSULIN DEGLUDEC 200 UNIT/ML ~~LOC~~ SOPN: 70.0000 [IU] | PEN_INJECTOR | Freq: Every day | SUBCUTANEOUS | Status: DC

## 2016-01-12 MED ORDER — METFORMIN HCL ER 500 MG PO TB24: 500.0000 mg | ORAL_TABLET | Freq: Every day | ORAL | Status: DC

## 2016-01-12 NOTE — Progress Notes (Signed)
Diabetes Follow-Up Visit Chief Complaint:   Chief Complaint  Patient presents with  . Diabetes  . Hyperlipidemia  . Hypertension    HPI: Patient is here today to follow up uncontrolled type 2 diabetes / HTN and Hyperlipidemia She has recently lost her husband in the last  2 months.   Patient had an insulin pump for about 1 year but in July of 2015 her insurance stopped paying for pump supplies and patient was switched insulin pens.   She is currently using Toujeo 80 units QD or Levemir 36 units bid and Novolog up to 30 units prior to meals to control BG.   She has taken Invokana 364m 1 tablet daily in the past but she stopped because she was concerned about side effects she saw on TV.  Has not taken in 3 weeks. She has also taken Trulicity but copay was too high.   Patient has also tried metformin regular and XR and very low dose but has not been able to tolerate in the past due to diarrhea and nausea. However, her grandson has been very successful getting BG under control with just metformin and she would like to retry.   Home BG Monitoring:  Checking up to 3 times a day.  7 day avg = 289 14 day avg = 299 (last 293) 30 day avg = 317 (last 300) 90 day avg =322 (last  328) Range 111 - 521   Patient has keto sticks at home and tests when BG greater than 400.  She has not had any positive urine ketone readings.  Exam Edema:  negative  Polyuria:  Negative Polydipsia:  Negative             Polyphagia:  negative   Edema - trace bilaterally  Weight changes:  stable General Appearance:  alert, oriented, no acute distress and obese Mood/Affect:  normal  Low fat/carbohydrate diet?  Yes - continues to limit CHO portions sizes; eating more salads and vegetables Nicotine Abuse?  No Medication Compliance?  Yes Exercise?  Yes - walking 2 days per week Alcohol Abuse?  No   Lab Results  Component Value Date   HGBA1c 9.4 11/30/2015   HGBA1C 9.3* 09/02/2015   HGBA1c 10.2 04/06/2015       Lipids panel from 11/30/2015  LDL = 50 Tg = 149 Total choletserol = 115 HDL = 35 LDL-P = 906   Assessment: 1.  Diabetes.  Uncontrolled 2.  Dyslipidemia - LDL and triglyerides at goal and HDL low. Patient on statin. 3.  HTN - controlled 4.  Low hemoglobin   Recommendations: 1.  Medication recommendations at this time are as follows:    Gave samples of Tresiba to try - inject 70 units once a day  Retry metformin XR take 1 tablet daily with breakfast.  Continue Novolog 30 units tid prior to meal and 10 units as needed for BG greater than 300.  Patient to call if gets more than 1 BG reading greater than 300 for insulin adjustment    2.  Revewed HBG goals:  Fasting 80-130 and 1-2 hour post prandial <180.  Patient is instructed to check BG 3 to 5  times per day.   3.  BP goal < 140/80.  4.  Patient questioning accuracey of her current glucometer.  Patient given glucometer in office today - One Touch Verio to test against her current meter (advised that there can be about a 10% difference in readings)  AccuCheck [employer].  she  states she is not sure she wants to change meters currently because she just received a 3 month supply of testing supplies. 5.  Return to clinic 1 month to follow up diabtes  Orders Placed This Encounter  Procedures  . Anemia Profile B  . BMP8+EGFR    Time spent counseling patient:  45 minutes   Cherre Robins, PharmD, CPP, CDE

## 2016-01-13 ENCOUNTER — Other Ambulatory Visit: Payer: Commercial Managed Care - HMO

## 2016-01-13 ENCOUNTER — Other Ambulatory Visit: Payer: Self-pay | Admitting: Pharmacist

## 2016-01-13 ENCOUNTER — Encounter: Payer: Self-pay | Admitting: Family Medicine

## 2016-01-13 DIAGNOSIS — Z1211 Encounter for screening for malignant neoplasm of colon: Secondary | ICD-10-CM

## 2016-01-13 DIAGNOSIS — E538 Deficiency of other specified B group vitamins: Secondary | ICD-10-CM | POA: Insufficient documentation

## 2016-01-13 LAB — ANEMIA PROFILE B
BASOS: 0 %
Basophils Absolute: 0 10*3/uL (ref 0.0–0.2)
EOS (ABSOLUTE): 0.1 10*3/uL (ref 0.0–0.4)
EOS: 2 %
FERRITIN: 139 ng/mL (ref 15–150)
Folate: 10.2 ng/mL (ref >3.0)
HEMATOCRIT: 37.2 % (ref 34.0–46.6)
HEMOGLOBIN: 12.4 g/dL (ref 11.1–15.9)
IMMATURE GRANS (ABS): 0 10*3/uL (ref 0.0–0.1)
IRON SATURATION: 27 % (ref 15–55)
IRON: 76 ug/dL (ref 27–139)
Immature Granulocytes: 0 %
LYMPHS: 32 %
Lymphocytes Absolute: 2.5 10*3/uL (ref 0.7–3.1)
MCH: 30.8 pg (ref 26.6–33.0)
MCHC: 33.3 g/dL (ref 31.5–35.7)
MCV: 92 fL (ref 79–97)
MONOCYTES: 4 %
Monocytes Absolute: 0.3 10*3/uL (ref 0.1–0.9)
NEUTROS ABS: 5.1 10*3/uL (ref 1.4–7.0)
Neutrophils: 62 %
Platelets: 216 10*3/uL (ref 150–379)
RBC: 4.03 x10E6/uL (ref 3.77–5.28)
RDW: 15.3 % (ref 12.3–15.4)
RETIC CT PCT: 2.5 % (ref 0.6–2.6)
Total Iron Binding Capacity: 281 ug/dL (ref 250–450)
UIBC: 205 ug/dL (ref 118–369)
VITAMIN B 12: 205 pg/mL — AB (ref 211–946)
WBC: 8.1 10*3/uL (ref 3.4–10.8)

## 2016-01-13 LAB — BMP8+EGFR
BUN/Creatinine Ratio: 12 (ref 12–28)
BUN: 12 mg/dL (ref 8–27)
CALCIUM: 9.3 mg/dL (ref 8.7–10.3)
CHLORIDE: 92 mmol/L — AB (ref 96–106)
CO2: 31 mmol/L — AB (ref 18–29)
Creatinine, Ser: 1.01 mg/dL — ABNORMAL HIGH (ref 0.57–1.00)
GFR, EST AFRICAN AMERICAN: 67 mL/min/{1.73_m2} (ref >59)
GFR, EST NON AFRICAN AMERICAN: 58 mL/min/{1.73_m2} — AB (ref >59)
Glucose: 296 mg/dL — ABNORMAL HIGH (ref 65–99)
POTASSIUM: 2.8 mmol/L — AB (ref 3.5–5.2)
Sodium: 141 mmol/L (ref 134–144)

## 2016-01-14 LAB — FECAL OCCULT BLOOD, IMMUNOCHEMICAL: Fecal Occult Bld: NEGATIVE

## 2016-01-18 ENCOUNTER — Telehealth: Payer: Self-pay | Admitting: Pharmacist

## 2016-01-18 ENCOUNTER — Other Ambulatory Visit: Payer: Self-pay | Admitting: Pharmacist

## 2016-01-18 ENCOUNTER — Other Ambulatory Visit: Payer: Commercial Managed Care - HMO

## 2016-01-18 DIAGNOSIS — E876 Hypokalemia: Secondary | ICD-10-CM

## 2016-01-18 LAB — BMP8+EGFR
BUN/Creatinine Ratio: 16 (ref 12–28)
BUN: 14 mg/dL (ref 8–27)
CALCIUM: 9.9 mg/dL (ref 8.7–10.3)
CHLORIDE: 93 mmol/L — AB (ref 96–106)
CO2: 29 mmol/L (ref 18–29)
Creatinine, Ser: 0.9 mg/dL (ref 0.57–1.00)
GFR calc Af Amer: 77 mL/min/{1.73_m2} (ref >59)
GFR, EST NON AFRICAN AMERICAN: 66 mL/min/{1.73_m2} (ref >59)
Glucose: 222 mg/dL — ABNORMAL HIGH (ref 65–99)
POTASSIUM: 3 mmol/L — AB (ref 3.5–5.2)
SODIUM: 140 mmol/L (ref 134–144)

## 2016-01-18 NOTE — Telephone Encounter (Signed)
Already done, call pharmacy to cancel

## 2016-01-20 NOTE — Addendum Note (Signed)
Addended by: Thana Ates on: 01/20/2016 08:08 AM   Modules accepted: Orders

## 2016-01-26 ENCOUNTER — Encounter: Payer: Self-pay | Admitting: *Deleted

## 2016-01-30 ENCOUNTER — Encounter: Payer: Self-pay | Admitting: *Deleted

## 2016-02-21 ENCOUNTER — Telehealth: Payer: Self-pay | Admitting: Family Medicine

## 2016-02-21 NOTE — Telephone Encounter (Signed)
Patient's number said it was not working when I tried both times.  We do have [place] insulin samples if she calls back.

## 2016-02-28 ENCOUNTER — Other Ambulatory Visit: Payer: Commercial Managed Care - HMO

## 2016-02-28 DIAGNOSIS — E876 Hypokalemia: Secondary | ICD-10-CM

## 2016-02-29 ENCOUNTER — Ambulatory Visit (INDEPENDENT_AMBULATORY_CARE_PROVIDER_SITE_OTHER): Payer: Commercial Managed Care - HMO | Admitting: Pharmacist

## 2016-02-29 ENCOUNTER — Encounter: Payer: Self-pay | Admitting: Pharmacist

## 2016-02-29 ENCOUNTER — Other Ambulatory Visit: Payer: Self-pay | Admitting: *Deleted

## 2016-02-29 ENCOUNTER — Emergency Department (HOSPITAL_COMMUNITY)
Admission: EM | Admit: 2016-02-29 | Discharge: 2016-02-29 | Disposition: A | Discharge status: Home / Self Care | Payer: Commercial Managed Care - HMO | Attending: Emergency Medicine | Admitting: Emergency Medicine

## 2016-02-29 ENCOUNTER — Encounter (HOSPITAL_COMMUNITY): Payer: Self-pay | Admitting: Emergency Medicine

## 2016-02-29 DIAGNOSIS — F329 Major depressive disorder, single episode, unspecified: Secondary | ICD-10-CM | POA: Diagnosis not present

## 2016-02-29 DIAGNOSIS — Z794 Long term (current) use of insulin: Secondary | ICD-10-CM

## 2016-02-29 DIAGNOSIS — E876 Hypokalemia: Secondary | ICD-10-CM | POA: Insufficient documentation

## 2016-02-29 DIAGNOSIS — I110 Hypertensive heart disease with heart failure: Secondary | ICD-10-CM | POA: Insufficient documentation

## 2016-02-29 DIAGNOSIS — E785 Hyperlipidemia, unspecified: Secondary | ICD-10-CM | POA: Diagnosis not present

## 2016-02-29 DIAGNOSIS — Z8673 Personal history of transient ischemic attack (TIA), and cerebral infarction without residual deficits: Secondary | ICD-10-CM | POA: Insufficient documentation

## 2016-02-29 DIAGNOSIS — Z7984 Long term (current) use of oral hypoglycemic drugs: Secondary | ICD-10-CM | POA: Diagnosis not present

## 2016-02-29 DIAGNOSIS — E1140 Type 2 diabetes mellitus with diabetic neuropathy, unspecified: Secondary | ICD-10-CM | POA: Diagnosis not present

## 2016-02-29 DIAGNOSIS — Z7722 Contact with and (suspected) exposure to environmental tobacco smoke (acute) (chronic): Secondary | ICD-10-CM | POA: Diagnosis not present

## 2016-02-29 DIAGNOSIS — R7989 Other specified abnormal findings of blood chemistry: Secondary | ICD-10-CM | POA: Diagnosis present

## 2016-02-29 DIAGNOSIS — E1151 Type 2 diabetes mellitus with diabetic peripheral angiopathy without gangrene: Secondary | ICD-10-CM | POA: Diagnosis not present

## 2016-02-29 DIAGNOSIS — I509 Heart failure, unspecified: Secondary | ICD-10-CM | POA: Insufficient documentation

## 2016-02-29 DIAGNOSIS — I2510 Atherosclerotic heart disease of native coronary artery without angina pectoris: Secondary | ICD-10-CM | POA: Diagnosis not present

## 2016-02-29 DIAGNOSIS — Z7982 Long term (current) use of aspirin: Secondary | ICD-10-CM | POA: Insufficient documentation

## 2016-02-29 DIAGNOSIS — Z79899 Other long term (current) drug therapy: Secondary | ICD-10-CM | POA: Insufficient documentation

## 2016-02-29 LAB — BASIC METABOLIC PANEL
Anion gap: 8 (ref 5–15)
BUN: 12 mg/dL (ref 6–20)
CO2: 37 mmol/L — ABNORMAL HIGH (ref 22–32)
CREATININE: 1.01 mg/dL — AB (ref 0.44–1.00)
Calcium: 8.7 mg/dL — ABNORMAL LOW (ref 8.9–10.3)
Chloride: 94 mmol/L — ABNORMAL LOW (ref 101–111)
GFR calc Af Amer: >60 mL/min (ref >60)
GFR, EST NON AFRICAN AMERICAN: 56 mL/min — AB (ref >60)
Glucose, Bld: 247 mg/dL — ABNORMAL HIGH (ref 65–99)
Potassium: 2.3 mmol/L — CL (ref 3.5–5.1)
SODIUM: 139 mmol/L (ref 135–145)

## 2016-02-29 LAB — MAGNESIUM: Magnesium: 1.7 mg/dL (ref 1.7–2.4)

## 2016-02-29 LAB — URINALYSIS, ROUTINE W REFLEX MICROSCOPIC
Bilirubin Urine: NEGATIVE
Glucose, UA: 250 mg/dL — AB
Ketones, ur: NEGATIVE mg/dL
Leukocytes, UA: NEGATIVE
Nitrite: NEGATIVE
Protein, ur: NEGATIVE mg/dL
Specific Gravity, Urine: 1.01 (ref 1.005–1.030)
pH: 7 (ref 5.0–8.0)

## 2016-02-29 LAB — URINE MICROSCOPIC-ADD ON
Bacteria, UA: NONE SEEN
RBC / HPF: NONE SEEN RBC/hpf (ref 0–5)
WBC, UA: NONE SEEN WBC/hpf (ref 0–5)

## 2016-02-29 LAB — I-STAT CHEM 8, ED
BUN: 10 mg/dL (ref 6–20)
Calcium, Ion: 1.03 mmol/L — ABNORMAL LOW (ref 1.13–1.30)
Chloride: 92 mmol/L — ABNORMAL LOW (ref 101–111)
Creatinine, Ser: 0.9 mg/dL (ref 0.44–1.00)
Glucose, Bld: 255 mg/dL — ABNORMAL HIGH (ref 65–99)
HCT: 34 % — ABNORMAL LOW (ref 36.0–46.0)
Hemoglobin: 11.6 g/dL — ABNORMAL LOW (ref 12.0–15.0)
Potassium: 2.4 mmol/L — CL (ref 3.5–5.1)
Sodium: 142 mmol/L (ref 135–145)
TCO2: 37 mmol/L (ref 0–100)

## 2016-02-29 LAB — BAYER DCA HB A1C WAIVED: HB A1C (BAYER DCA - WAIVED): 8.1 % — ABNORMAL HIGH (ref <7.0)

## 2016-02-29 LAB — BMP8+EGFR
BUN/Creatinine Ratio: 16 (ref 12–28)
BUN: 14 mg/dL (ref 8–27)
CALCIUM: 9 mg/dL (ref 8.7–10.3)
CHLORIDE: 95 mmol/L — AB (ref 96–106)
CO2: 30 mmol/L — AB (ref 18–29)
Creatinine, Ser: 0.9 mg/dL (ref 0.57–1.00)
GFR calc Af Amer: 77 mL/min/{1.73_m2} (ref >59)
GFR calc non Af Amer: 66 mL/min/{1.73_m2} (ref >59)
Glucose: 214 mg/dL — ABNORMAL HIGH (ref 65–99)
Potassium: 2.7 mmol/L — ABNORMAL LOW (ref 3.5–5.2)
Sodium: 141 mmol/L (ref 134–144)

## 2016-02-29 LAB — CBC
HCT: 35.5 % — ABNORMAL LOW (ref 36.0–46.0)
Hemoglobin: 12.1 g/dL (ref 12.0–15.0)
MCH: 30 pg (ref 26.0–34.0)
MCHC: 34.1 g/dL (ref 30.0–36.0)
MCV: 87.9 fL (ref 78.0–100.0)
PLATELETS: 185 10*3/uL (ref 150–400)
RBC: 4.04 MIL/uL (ref 3.87–5.11)
RDW: 13.7 % (ref 11.5–15.5)
WBC: 8.3 10*3/uL (ref 4.0–10.5)

## 2016-02-29 MED ORDER — MAGNESIUM SULFATE IN D5W 1-5 GM/100ML-% IV SOLN
1.0000 g | Freq: Once | INTRAVENOUS | Status: AC
  Administered 2016-02-29: 1 g via INTRAVENOUS
  Filled 2016-02-29 (×3): qty 100

## 2016-02-29 MED ORDER — POTASSIUM CHLORIDE CRYS ER 20 MEQ PO TBCR
40.0000 meq | EXTENDED_RELEASE_TABLET | Freq: Once | ORAL | Status: AC
  Administered 2016-02-29: 40 meq via ORAL
  Filled 2016-02-29: qty 2

## 2016-02-29 MED ORDER — SPIRONOLACTONE 50 MG PO TABS: 50.0000 mg | ORAL_TABLET | Freq: Every day | ORAL | Status: DC

## 2016-02-29 MED ORDER — POTASSIUM CHLORIDE 10 MEQ/100ML IV SOLN
10.0000 meq | Freq: Once | INTRAVENOUS | Status: AC
  Administered 2016-02-29: 10 meq via INTRAVENOUS
  Filled 2016-02-29: qty 100

## 2016-02-29 NOTE — ED Notes (Signed)
Pt reports having blood drawn yesterday, called pt today and potassium was 2.2.  Pt sent here for evaluation.  Pt has had palpitations for past few years and has had joint cramping for past couple months. Pt on lasix and also takes potassium oral at home.

## 2016-02-29 NOTE — ED Notes (Signed)
Pt given lunch tray.

## 2016-02-29 NOTE — Discharge Instructions (Signed)

## 2016-02-29 NOTE — Progress Notes (Signed)
Diabetes Follow-Up Visit Chief Complaint:   No chief complaint on file.   HPI: Patient is here today to follow up uncontrolled type 2 diabetes / HTN and Hyperlipidemia, however she came in yesterday to have BMET checked and it was found that her potassium was 2.7.    Current outpatient prescriptions:  .  ALPRAZolam (XANAX) 0.5 MG tablet, TAKE ONE TABLET 3 TIMES A DAY AS NEEDED., Disp: 90 tablet, Rfl: 1 .  aspirin EC 325 MG tablet, Take 325 mg by mouth daily., Disp: , Rfl:  .  atenolol (TENORMIN) 50 MG tablet, Take 1 tablet (50 mg total) by mouth 2 (two) times daily., Disp: 180 tablet, Rfl: 2 .  citalopram (CELEXA) 20 MG tablet, Take 1 tablet (20 mg total) by mouth daily., Disp: 90 tablet, Rfl: 1 .  clopidogrel (PLAVIX) 75 MG tablet, Take 1 tablet (75 mg total) by mouth daily., Disp: 90 tablet, Rfl: 3 . furosemide (LASIX) 40mg  tablet, take 1 tablet twice a day - Insulin Degludec (TRESIBA FLEXTOUCH) 200 UNIT/ML SOPN, Inject 70 Units into the skin daily., Disp: 36 mL, Rfl: 1 .  isosorbide mononitrate (IMDUR) 120 MG 24 hr tablet, Take 1 tablet (120 mg total) by mouth daily., Disp: 30 tablet, Rfl: 1 .  lisinopril (PRINIVIL,ZESTRIL) 40 MG tablet, Take 1 tablet (40 mg total) by mouth daily., Disp: 90 tablet, Rfl: 1 .  metFORMIN (GLUCOPHAGE-XR) 500 MG 24 hr tablet, Take 1 tablet (500 mg total) by mouth daily with breakfast., Disp: 90 tablet, Rfl: 1 .  nitroGLYCERIN (NITROSTAT) 0.4 MG SL tablet, Place 1 tablet (0.4 mg total) under the tongue every 5 (five) minutes as needed. For chest pain, Disp: 30 tablet, Rfl: 11 .  NOVOLOG FLEXPEN 100 UNIT/ML FlexPen, INJECT 35 UNITS SQ 3 TIMES DAILY, Disp: 30 mL, Rfl: 1 .  potassium chloride SA (K-DUR,KLOR-CON) 20 MEQ tablet, Take 1 tablet (20 mEq total) by mouth 2 (two) times daily., Disp: 180 tablet, Rfl: 1 .  rosuvastatin (CRESTOR) 40 MG tablet, Take 1 tablet (40 mg total) by mouth daily., Disp: 90 tablet, Rfl: 4 .  vitamin B-12 (CYANOCOBALAMIN) 1000 MCG tablet,  Take 1,000 mcg by mouth daily., Disp: , Rfl:  .  Vitamin D, Ergocalciferol, (DRISDOL) 50000 UNITS CAPS capsule, Take 1 capsule (50,000 Units total) by mouth every 7 (seven) days., Disp: 12 capsule, Rfl: 1 .  zolpidem (AMBIEN) 10 MG tablet, TAKE 1/2 TO 1 TABLET AT BEDTIME AS NEEDED, Disp: 90 tablet, Rfl: 1   Home BG Monitoring:  Checking up to 3 times a day.  7 day avg = 267 (was 289) 14 day avg = 277 (last 299) 30 day avg = 274 (last 319) 90 day avg =293 (last  322)   Patient has keto sticks at home and tests when BG greater than 400.  She has not had any positive urine ketone readings.  Exam Edema:  negative  Polyuria:  Negative Polydipsia:  Negative             Polyphagia:  negative   Edema - trace bilaterally   Review of Systems  Constitutional: Positive for malaise/fatigue.  HENT: Negative.   Eyes: Negative.   Respiratory: Negative.   Cardiovascular: Positive for leg swelling.  Gastrointestinal: Negative.   Neurological: Positive for weakness.      Assessment: 1.  Diabetes.  Uncontrolled but improved 2.  Hypokalemia - possibly worsened by diuretic use   Recommendations: 1.  Medication recommendations at this time are as follows:    Hold  furosemide  Start spironolactone 50mg  take 1 tablet daily   2.  Discussed continue low potassium. Patient sent to [facility] ER for potassium repletion in light of insulin use / diabetes  No orders of the defined types were placed in this encounter.    Time spent counseling patient:  20 minutes   Cherre Robins, PharmD, CPP, CDE

## 2016-02-29 NOTE — Patient Instructions (Signed)
We are sending you to [facility] ER for potassium correction  Hold furosemide I have sent in prescription for a new fluid tablet - spironoloactone 50mg  take 1 tablet daily.  This fluid tablet will not decreased potassium as much as furosemide does.  Return to have potassium checked Monday, June 12th.

## 2016-02-29 NOTE — ED Notes (Signed)
CRITICAL VALUE ALERT  Critical value received: Potassium 2.3  Date of notification:  02/29/16  Time of notification:  [id number]  Critical value read back:Yes.    Nurse who received alert:  Charmayne Sheer, RN  MD notified (1st page):  [facility]

## 2016-02-29 NOTE — ED Provider Notes (Signed)
CSN: [id number]     Arrival date & time 02/29/16  1015 History   By signing my name below, I, Nicole Kindred, attest that this documentation has been prepared under the direction and in the presence of Isla Pence, MD.   Electronically Signed: Nicole Kindred, ED Scribe. 02/29/2016. 1:25 PM   Chief Complaint  Patient presents with  . Abnormal Lab   The history is provided by the patient. No language interpreter was used.   HPI Comments: [name] is a 65 y.o. female with extensive PMHx as noted below who presents to the Emergency Department for further evaluation following abnormal labs. Pt had blood work drawn by PCP yesterday and her potassium was measured at 2.2. Pt states hx of palpitations for past few years. She also notes increased joint cramping in the past several months. Pt does not have any acute complaints at this time. No other associated symptoms noted. No worsening or alleviating factors noted. Pt denies diarrhea, emesis, or any other pertinent symptoms. Pt is on lasix and potassium at home.  Past Medical History  Diagnosis Date  . Diverticulitis, colon   . MS (multiple sclerosis) ([place])     Not confirmed  . Depression   . PVD (peripheral vascular disease) ([place])   . HLD (hyperlipidemia)   . Essential hypertension, benign   . CAD (coronary artery disease)     DES to circumflex 02/2007, BMS to LAD and PTCA diagonal 03/2007  . Carotid artery plaque     Mild  . GERD (gastroesophageal reflux disease)   . NSTEMI (non-ST elevated myocardial infarction) ([place])     02/2007  . Prolapse of uterus   . TIA (transient ischemic attack) 1980's  . PAT (paroxysmal atrial tachycardia) ([place])   . Anxiety   . CHF (congestive heart failure) ([place])   . Anginal pain ([place])   . Pneumonia 1980's    "once"  . IDDM (insulin dependent diabetes mellitus) ([place])   . H/O hiatal hernia   . Migraine     "used to have them really bad; don't have them anymore" (01/07/2014)  . Elevated d-dimer  01/08/2014  . Cataract    Past Surgical History  Procedure Laterality Date  . Appendectomy  ~ 1970  . Cholecystectomy  ?1987  . Breast biopsy Right 1980's  . Colonoscopy  2002    Dr. Anwar--> Severe diverticular changes in the region of the sigmoid and descending colon with scattered diverticular changes throughout the rest of the colon. No polyps, ulcerations. Despite numerous manipulations, the tip of the scope could not be tipped into the cecal area.  . Colonoscopy  01/10/2012    Procedure: COLONOSCOPY;  Surgeon: Daneil Dolin, MD;  [facility];  Service: Endoscopy;  Laterality: N/A;  1:55  . Coronary angioplasty with stent placement  ~ 1997 X 2    "2 + 1"  . Cardiac catheterization  01/07/2014  . Abdominal hysterectomy  1986    ovaries remain - prolaspe uterus   . Breast lumpectomy Right 1980's    Dr. Charlynne Pander   . Eye surgery Bilateral 2014    cataract  . Left heart catheterization with coronary angiogram N/A 01/07/2014    Procedure: LEFT HEART CATHETERIZATION WITH CORONARY ANGIOGRAM;  Surgeon: Larey Dresser, MD;  Location[facility] CATH LAB;  Service: Cardiovascular;  Laterality: N/A;   Family History  Problem Relation Age of Onset  . Heart attack Mother 76  . Diabetes Father   . Heart attack  Father 20  . Heart attack Brother 30    x6  . Colon cancer Paternal Aunt     64s, died with brain anuerysm  . Crohn's disease Cousin     paternal   Social History  Substance Use Topics  . Smoking status: Passive Smoke Exposure - Never Smoker  . Smokeless tobacco: Never Used     Comment: spouse, 20 years - husband has quit 01/2011  . Alcohol Use: No   OB History    No data available     Review of Systems  Cardiovascular:       Hx of palpitations; none currently.  Gastrointestinal: Negative for vomiting and diarrhea.  Musculoskeletal:       Increased joint cramping in past several months.  All other systems reviewed and are negative.    Allergies  Iohexol;  Ticlid; and Codeine  Home Medications   Prior to Admission medications   Medication Sig Start Date End Date Taking? Authorizing Provider  ALPRAZolam Duanne Moron) 0.5 MG tablet TAKE ONE TABLET 3 TIMES A DAY AS NEEDED. 11/30/15  Yes Chipper Herb, MD  aspirin EC 325 MG tablet Take 325 mg by mouth daily.   Yes Historical Provider, MD  atenolol (TENORMIN) 50 MG tablet Take 1 tablet (50 mg total) by mouth 2 (two) times daily. 11/30/15  Yes Chipper Herb, MD  citalopram (CELEXA) 20 MG tablet Take 1 tablet (20 mg total) by mouth daily. 11/30/15  Yes Chipper Herb, MD  clopidogrel (PLAVIX) 75 MG tablet Take 1 tablet (75 mg total) by mouth daily. 11/30/15  Yes Chipper Herb, MD  furosemide (LASIX) 40 MG tablet Take 40 mg by mouth 2 (two) times daily.   Yes Historical Provider, MD  Insulin Degludec (TRESIBA FLEXTOUCH) 200 UNIT/ML SOPN Inject 70 Units into the skin daily. 01/12/16  Yes Tammy Eckard, PHARMD  isosorbide mononitrate (IMDUR) 120 MG 24 hr tablet Take 1 tablet (120 mg total) by mouth daily. 11/30/15  Yes Chipper Herb, MD  lisinopril (PRINIVIL,ZESTRIL) 40 MG tablet Take 1 tablet (40 mg total) by mouth daily. 11/30/15  Yes Chipper Herb, MD  metFORMIN (GLUCOPHAGE-XR) 500 MG 24 hr tablet Take 1 tablet (500 mg total) by mouth daily with breakfast. 01/12/16  Yes Chipper Herb, MD  nitroGLYCERIN (NITROSTAT) 0.4 MG SL tablet Place 1 tablet (0.4 mg total) under the tongue every 5 (five) minutes as needed. For chest pain 06/11/13  Yes Chipper Herb, MD  NOVOLOG FLEXPEN 100 UNIT/ML FlexPen INJECT 35 UNITS SQ 3 TIMES DAILY 01/18/16  Yes Chipper Herb, MD  potassium chloride SA (K-DUR,KLOR-CON) 20 MEQ tablet Take 1 tablet (20 mEq total) by mouth 2 (two) times daily. 11/30/15  Yes Chipper Herb, MD  rosuvastatin (CRESTOR) 40 MG tablet Take 1 tablet (40 mg total) by mouth daily. 11/30/15  Yes Chipper Herb, MD  vitamin B-12 (CYANOCOBALAMIN) 1000 MCG tablet Take 1,000 mcg by mouth daily.   Yes Historical Provider, MD   Vitamin D, Ergocalciferol, (DRISDOL) 50000 UNITS CAPS capsule Take 1 capsule (50,000 Units total) by mouth every 7 (seven) days. 08/04/15  Yes Tammy Eckard, PHARMD  zolpidem (AMBIEN) 10 MG tablet TAKE 1/2 TO 1 TABLET AT BEDTIME AS NEEDED Patient taking differently: Take 5-10 mg by mouth at bedtime as needed for sleep. TAKE 1/2 TO 1 TABLET AT BEDTIME AS NEEDED 11/30/15  Yes Chipper Herb, MD  Dulaglutide 0.75 MG/0.5ML SOPN Inject 0.75 mg into the skin once a week.  Historical Provider, MD  spironolactone (ALDACTONE) 50 MG tablet Take 1 tablet (50 mg total) by mouth daily. 02/29/16   Tammy Eckard, PHARMD   BP 146/63 mmHg  Pulse 70  Temp(Src) 97.5 F (36.4 C) (Oral)  Resp 20  Ht 5\' 6"  (1.676 m)  Wt 175 lb (79.379 kg)  BMI 28.26 kg/m2  SpO2 98% Physical Exam  Constitutional: She is oriented to person, place, and time. She appears well-developed and well-nourished.  HENT:  Head: Normocephalic.  Right Ear: External ear normal.  Left Ear: External ear normal.  Eyes: EOM are normal.  Neck: Normal range of motion.  Cardiovascular: Normal rate, regular rhythm and normal heart sounds.  Exam reveals no gallop.   No murmur heard. Pulmonary/Chest: Effort normal and breath sounds normal. No respiratory distress. She has no wheezes. She has no rales.  Abdominal: Soft. Bowel sounds are normal. She exhibits no distension. There is no tenderness.  Musculoskeletal: Normal range of motion.  Neurological: She is alert and oriented to person, place, and time.  Psychiatric: She has a normal mood and affect.  Nursing note and vitals reviewed.   ED Course  Procedures (including critical care time) DIAGNOSTIC STUDIES: Oxygen Saturation is 98% on RA, normal by my interpretation.    COORDINATION OF CARE: 10:34 AM Discussed treatment plan with pt at bedside and pt agreed to plan.   Labs Review Labs Reviewed  BASIC METABOLIC PANEL - Abnormal; Notable for the following:    Potassium 2.3 (*)     Chloride 94 (*)    CO2 37 (*)    Glucose, Bld 247 (*)    Creatinine, Ser 1.01 (*)    Calcium 8.7 (*)    GFR calc non Af Amer 56 (*)    All other components within normal limits  CBC - Abnormal; Notable for the following:    HCT 35.5 (*)    All other components within normal limits  URINALYSIS, ROUTINE W REFLEX MICROSCOPIC (NOT AT [facility]) - Abnormal; Notable for the following:    Glucose, UA 250 (*)    Hgb urine dipstick SMALL (*)    All other components within normal limits  URINE MICROSCOPIC-ADD ON - Abnormal; Notable for the following:    Squamous Epithelial / LPF 0-5 (*)    All other components within normal limits  I-STAT CHEM 8, ED - Abnormal; Notable for the following:    Potassium 2.4 (*)    Chloride 92 (*)    Glucose, Bld 255 (*)    Calcium, Ion 1.03 (*)    Hemoglobin 11.6 (*)    HCT 34.0 (*)    All other components within normal limits  MAGNESIUM    Imaging Review No results found. I have personally reviewed and evaluated these images and lab results as part of my medical decision-making.   EKG Interpretation   Date/Time:  Wednesday February 29 2016 10:25:56 EDT Ventricular Rate:  72 PR Interval:  205 QRS Duration: 96 QT Interval:  444 QTC Calculation: 486 R Axis:   -38 Text Interpretation:  Sinus rhythm Inferior infarct, old Confirmed by  Monique Gift MD, Bill Mcvey ([id number]) on 02/29/2016 10:36:30 AM      MDM  PT'S PCP ALREADY TOLD HER TO STOP LASIX AND START SPIRONOLACTONE.  I WILL ADD MAGNESIUM TO HER REGIMEN TO SEE IF THAT WILL HELP CHRONIC HYPOKALEMIA. Final diagnoses:  Hypokalemia   I personally performed the services described in this documentation, which was scribed in my presence. The recorded information has been reviewed  and is accurate.     Isla Pence, MD 02/29/16 1325

## 2016-03-02 ENCOUNTER — Other Ambulatory Visit: Payer: Commercial Managed Care - HMO

## 2016-03-02 ENCOUNTER — Telehealth: Payer: Self-pay | Admitting: Family Medicine

## 2016-03-02 ENCOUNTER — Emergency Department (HOSPITAL_COMMUNITY)
Admission: EM | Admit: 2016-03-02 | Discharge: 2016-03-02 | Disposition: A | Discharge status: Home / Self Care | Payer: Commercial Managed Care - HMO | Attending: Emergency Medicine | Admitting: Emergency Medicine

## 2016-03-02 ENCOUNTER — Encounter (HOSPITAL_COMMUNITY): Payer: Self-pay | Admitting: *Deleted

## 2016-03-02 ENCOUNTER — Ambulatory Visit: Payer: Self-pay | Admitting: Pharmacist

## 2016-03-02 DIAGNOSIS — F329 Major depressive disorder, single episode, unspecified: Secondary | ICD-10-CM | POA: Insufficient documentation

## 2016-03-02 DIAGNOSIS — E876 Hypokalemia: Secondary | ICD-10-CM

## 2016-03-02 DIAGNOSIS — I2510 Atherosclerotic heart disease of native coronary artery without angina pectoris: Secondary | ICD-10-CM | POA: Diagnosis not present

## 2016-03-02 DIAGNOSIS — Z7722 Contact with and (suspected) exposure to environmental tobacco smoke (acute) (chronic): Secondary | ICD-10-CM | POA: Diagnosis not present

## 2016-03-02 DIAGNOSIS — Z794 Long term (current) use of insulin: Secondary | ICD-10-CM | POA: Diagnosis not present

## 2016-03-02 DIAGNOSIS — E785 Hyperlipidemia, unspecified: Secondary | ICD-10-CM | POA: Diagnosis not present

## 2016-03-02 DIAGNOSIS — Z7982 Long term (current) use of aspirin: Secondary | ICD-10-CM | POA: Diagnosis not present

## 2016-03-02 DIAGNOSIS — I110 Hypertensive heart disease with heart failure: Secondary | ICD-10-CM | POA: Insufficient documentation

## 2016-03-02 DIAGNOSIS — I509 Heart failure, unspecified: Secondary | ICD-10-CM | POA: Insufficient documentation

## 2016-03-02 DIAGNOSIS — I739 Peripheral vascular disease, unspecified: Secondary | ICD-10-CM | POA: Diagnosis not present

## 2016-03-02 DIAGNOSIS — R799 Abnormal finding of blood chemistry, unspecified: Secondary | ICD-10-CM | POA: Diagnosis present

## 2016-03-02 LAB — COMPREHENSIVE METABOLIC PANEL
ALBUMIN: 3.8 g/dL (ref 3.5–5.0)
ALK PHOS: 40 U/L (ref 38–126)
ALT: 15 U/L (ref 14–54)
AST: 20 U/L (ref 15–41)
Anion gap: 7 (ref 5–15)
BILIRUBIN TOTAL: 0.5 mg/dL (ref 0.3–1.2)
BUN: 10 mg/dL (ref 6–20)
CO2: 31 mmol/L (ref 22–32)
CREATININE: 0.92 mg/dL (ref 0.44–1.00)
Calcium: 9.1 mg/dL (ref 8.9–10.3)
Chloride: 102 mmol/L (ref 101–111)
GFR calc Af Amer: >60 mL/min (ref >60)
GFR calc non Af Amer: >60 mL/min (ref >60)
GLUCOSE: 225 mg/dL — AB (ref 65–99)
Potassium: 3.1 mmol/L — ABNORMAL LOW (ref 3.5–5.1)
SODIUM: 140 mmol/L (ref 135–145)
Total Protein: 7.2 g/dL (ref 6.5–8.1)

## 2016-03-02 LAB — MAGNESIUM: Magnesium: 1.8 mg/dL (ref 1.7–2.4)

## 2016-03-02 LAB — BMP8+EGFR
BUN/Creatinine Ratio: 9 — ABNORMAL LOW (ref 12–28)
BUN: 8 mg/dL (ref 8–27)
CALCIUM: 9.1 mg/dL (ref 8.7–10.3)
CHLORIDE: 100 mmol/L (ref 96–106)
CO2: 31 mmol/L — AB (ref 18–29)
Creatinine, Ser: 0.91 mg/dL (ref 0.57–1.00)
GFR calc Af Amer: 76 mL/min/{1.73_m2} (ref >59)
GFR, EST NON AFRICAN AMERICAN: 65 mL/min/{1.73_m2} (ref >59)
GLUCOSE: 130 mg/dL — AB (ref 65–99)
POTASSIUM: 2.7 mmol/L — AB (ref 3.5–5.2)
Sodium: 140 mmol/L (ref 134–144)

## 2016-03-02 LAB — CBC WITH DIFFERENTIAL/PLATELET
BASOS ABS: 0 10*3/uL (ref 0.0–0.1)
BASOS PCT: 0 %
Eosinophils Absolute: 0.1 10*3/uL (ref 0.0–0.7)
Eosinophils Relative: 1 %
HEMATOCRIT: 33 % — AB (ref 36.0–46.0)
HEMOGLOBIN: 11.4 g/dL — AB (ref 12.0–15.0)
Lymphocytes Relative: 35 %
Lymphs Abs: 2.6 10*3/uL (ref 0.7–4.0)
MCH: 30.5 pg (ref 26.0–34.0)
MCHC: 34.5 g/dL (ref 30.0–36.0)
MCV: 88.2 fL (ref 78.0–100.0)
MONOS PCT: 6 %
Monocytes Absolute: 0.5 10*3/uL (ref 0.1–1.0)
NEUTROS ABS: 4.3 10*3/uL (ref 1.7–7.7)
NEUTROS PCT: 58 %
Platelets: 153 10*3/uL (ref 150–400)
RBC: 3.74 MIL/uL — AB (ref 3.87–5.11)
RDW: 13.7 % (ref 11.5–15.5)
WBC: 7.4 10*3/uL (ref 4.0–10.5)

## 2016-03-02 MED ORDER — POTASSIUM CHLORIDE CRYS ER 20 MEQ PO TBCR
40.0000 meq | EXTENDED_RELEASE_TABLET | Freq: Once | ORAL | Status: AC
  Administered 2016-03-02: 40 meq via ORAL
  Filled 2016-03-02: qty 2

## 2016-03-02 MED ORDER — SODIUM CHLORIDE 0.9 % IV BOLUS (SEPSIS)
500.0000 mL | Freq: Once | INTRAVENOUS | Status: AC
  Administered 2016-03-02: 500 mL via INTRAVENOUS

## 2016-03-02 NOTE — ED Notes (Signed)
Pt reports she was here on Wednesday and had a potassium of 2.4. Pt reports she was told today that her potassium was low (2.4). Pt states she was told to come to the ED. Pt also reports fatigue.

## 2016-03-02 NOTE — ED Provider Notes (Signed)
CSN: [id number]     Arrival date & time 03/02/16  1941 History  By signing my name below, I, [name], attest that this documentation has been prepared under the direction and in the presence of physician practitioner, Nat Christen, MD. Electronically Signed: Dora [name], Scribe. 03/02/2016. 10:00 PM.    No chief complaint on file.   The history is provided by the patient. No language interpreter was used.     HPI Comments: [name] is a 65 y.o. female who presents to the Emergency Department complaining of abnormal labs taken today. Pt was seen here 2 days ago on referral from her PCP; she had blood drawn by her PCP two days ago and her potassium was measured at 2.4. She was administered IV potassium and magnesium while here two days ago. Pt had blood drawn by her PCP again this morning and her potassium was measured at 2.7; she was instructed to return to the ER for potassium and magnesium supplementation. Pt has used potassium pills for many years and takes 1 pill twice a day. She endorses fatigue. She denies diarrhea, vomiting, or any other associated symptoms.  Past Medical History  Diagnosis Date  . Diverticulitis, colon   . MS (multiple sclerosis) ([place])     Not confirmed  . Depression   . PVD (peripheral vascular disease) ([place])   . HLD (hyperlipidemia)   . Essential hypertension, benign   . CAD (coronary artery disease)     DES to circumflex 02/2007, BMS to LAD and PTCA diagonal 03/2007  . Carotid artery plaque     Mild  . GERD (gastroesophageal reflux disease)   . NSTEMI (non-ST elevated myocardial infarction) ([place])     02/2007  . Prolapse of uterus   . TIA (transient ischemic attack) 1980's  . PAT (paroxysmal atrial tachycardia) ([place])   . Anxiety   . CHF (congestive heart failure) ([place])   . Anginal pain ([place])   . Pneumonia 1980's    "once"  . IDDM (insulin dependent diabetes mellitus) ([place])   . H/O hiatal hernia   . Migraine     "used to have them really bad;  don't have them anymore" (01/07/2014)  . Elevated d-dimer 01/08/2014  . Cataract    Past Surgical History  Procedure Laterality Date  . Appendectomy  ~ 1970  . Cholecystectomy  ?1987  . Breast biopsy Right 1980's  . Colonoscopy  2002    Dr. Anwar--> Severe diverticular changes in the region of the sigmoid and descending colon with scattered diverticular changes throughout the rest of the colon. No polyps, ulcerations. Despite numerous manipulations, the tip of the scope could not be tipped into the cecal area.  . Colonoscopy  01/10/2012    Procedure: COLONOSCOPY;  Surgeon: Daneil Dolin, MD;  [facility];  Service: Endoscopy;  Laterality: N/A;  1:55  . Coronary angioplasty with stent placement  ~ 1997 X 2    "2 + 1"  . Cardiac catheterization  01/07/2014  . Abdominal hysterectomy  1986    ovaries remain - prolaspe uterus   . Breast lumpectomy Right 1980's    Dr. Charlynne Pander   . Eye surgery Bilateral 2014    cataract  . Left heart catheterization with coronary angiogram N/A 01/07/2014    Procedure: LEFT HEART CATHETERIZATION WITH CORONARY ANGIOGRAM;  Surgeon: Larey Dresser, MD;  Location: [facility] CATH LAB;  Service: Cardiovascular;  Laterality: N/A;   Family History  Problem Relation Age of Onset  .  Heart attack Mother 37  . Diabetes Father   . Heart attack Father 66  . Heart attack Brother 30    x6  . Colon cancer Paternal Aunt     35s, died with brain anuerysm  . Crohn's disease Cousin     paternal   Social History  Substance Use Topics  . Smoking status: Passive Smoke Exposure - Never Smoker  . Smokeless tobacco: Never Used     Comment: spouse, 11 years - husband has quit 01/2011  . Alcohol Use: No   OB History    No data available     Review of Systems  A complete 10 system review of systems was obtained and all systems are negative except as noted in the HPI and PMH.   Allergies  Iohexol; Ticlid; and Codeine  Home Medications   Prior to Admission  medications   Medication Sig Start Date End Date Taking? Authorizing Provider  ALPRAZolam (XANAX) 0.5 MG tablet TAKE ONE TABLET 3 TIMES A DAY AS NEEDED. Patient taking differently: Take 0.5 mg by mouth 3 (three) times daily as needed for anxiety or sleep.  11/30/15  Yes Chipper Herb, MD  aspirin EC 325 MG tablet Take 325 mg by mouth daily.   Yes Historical Provider, MD  atenolol (TENORMIN) 50 MG tablet Take 1 tablet (50 mg total) by mouth 2 (two) times daily. 11/30/15  Yes Chipper Herb, MD  citalopram (CELEXA) 20 MG tablet Take 1 tablet (20 mg total) by mouth daily. 11/30/15  Yes Chipper Herb, MD  clopidogrel (PLAVIX) 75 MG tablet Take 1 tablet (75 mg total) by mouth daily. 11/30/15  Yes Chipper Herb, MD  insulin detemir (LEVEMIR) 100 UNIT/ML injection Inject 40 Units into the skin 2 (two) times daily.   Yes Historical Provider, MD  isosorbide mononitrate (IMDUR) 120 MG 24 hr tablet Take 1 tablet (120 mg total) by mouth daily. 11/30/15  Yes Chipper Herb, MD  lisinopril (PRINIVIL,ZESTRIL) 40 MG tablet Take 1 tablet (40 mg total) by mouth daily. 11/30/15  Yes Chipper Herb, MD  metFORMIN (GLUCOPHAGE-XR) 500 MG 24 hr tablet Take 1 tablet (500 mg total) by mouth daily with breakfast. 01/12/16  Yes Chipper Herb, MD  nitroGLYCERIN (NITROSTAT) 0.4 MG SL tablet Place 1 tablet (0.4 mg total) under the tongue every 5 (five) minutes as needed. For chest pain 06/11/13  Yes Chipper Herb, MD  NOVOLOG FLEXPEN 100 UNIT/ML FlexPen INJECT 35 UNITS SQ 3 TIMES DAILY Patient taking differently: INJECT 35 UNITS  3 TIMES DAILY 01/18/16  Yes Chipper Herb, MD  potassium chloride SA (K-DUR,KLOR-CON) 20 MEQ tablet Take 1 tablet (20 mEq total) by mouth 2 (two) times daily. 11/30/15  Yes Chipper Herb, MD  rosuvastatin (CRESTOR) 40 MG tablet Take 1 tablet (40 mg total) by mouth daily. 11/30/15  Yes Chipper Herb, MD  spironolactone (ALDACTONE) 50 MG tablet Take 1 tablet (50 mg total) by mouth daily. 02/29/16  Yes Tammy Eckard,  PHARMD  vitamin B-12 (CYANOCOBALAMIN) 1000 MCG tablet Take 1,000 mcg by mouth daily.   Yes Historical Provider, MD  Vitamin D, Ergocalciferol, (DRISDOL) 50000 UNITS CAPS capsule Take 1 capsule (50,000 Units total) by mouth every 7 (seven) days. 08/04/15  Yes Tammy Eckard, PHARMD  zolpidem (AMBIEN) 10 MG tablet TAKE 1/2 TO 1 TABLET AT BEDTIME AS NEEDED Patient taking differently: Take 5-10 mg by mouth at bedtime as needed for sleep. TAKE 1/2 TO 1 TABLET AT BEDTIME  AS NEEDED 11/30/15  Yes Chipper Herb, MD  Dulaglutide 0.75 MG/0.5ML SOPN Inject 0.75 mg into the skin once a week.    Historical Provider, MD  Insulin Degludec (TRESIBA FLEXTOUCH) 200 UNIT/ML SOPN Inject 70 Units into the skin daily. Patient not taking: Reported on 03/02/2016 01/12/16   Tammy Eckard, PHARMD   BP 156/66 mmHg  Pulse 78  Temp(Src) 98.7 F (37.1 C) (Oral)  Resp 17  Ht 5\' 6"  (1.676 m)  Wt 174 lb (78.926 kg)  BMI 28.10 kg/m2  SpO2 98% Physical Exam  Constitutional: She is oriented to person, place, and time. She appears well-developed and well-nourished.  Fatigued but alert.  HENT:  Head: Normocephalic and atraumatic.  Eyes: Conjunctivae and EOM are normal. Pupils are equal, round, and reactive to light.  Neck: Normal range of motion. Neck supple.  Cardiovascular: Normal rate and regular rhythm.   Pulmonary/Chest: Effort normal and breath sounds normal.  Abdominal: Soft. Bowel sounds are normal.  Musculoskeletal: Normal range of motion.  Neurological: She is alert and oriented to person, place, and time.  Skin: Skin is warm and dry.  Psychiatric: She has a normal mood and affect. Her behavior is normal.  Nursing note and vitals reviewed.   ED Course  Procedures (including critical care time)  DIAGNOSTIC STUDIES: Oxygen Saturation is 100% on RA, normal by my interpretation.    COORDINATION OF CARE: 10:00 PM Will administer fluids. Will order blood work and magnesium. Discussed treatment plan with pt at  bedside and pt agreed to plan.  Results for orders placed or performed during the hospital encounter of 03/02/16  CBC with Differential  Result Value Ref Range   WBC 7.4 4.0 - 10.5 K/uL   RBC 3.74 (L) 3.87 - 5.11 MIL/uL   Hemoglobin 11.4 (L) 12.0 - 15.0 g/dL   HCT 33.0 (L) 36.0 - 46.0 %   MCV 88.2 78.0 - 100.0 fL   MCH 30.5 26.0 - 34.0 pg   MCHC 34.5 30.0 - 36.0 g/dL   RDW 13.7 11.5 - 15.5 %   Platelets 153 150 - 400 K/uL   Neutrophils Relative % 58 %   Neutro Abs 4.3 1.7 - 7.7 K/uL   Lymphocytes Relative 35 %   Lymphs Abs 2.6 0.7 - 4.0 K/uL   Monocytes Relative 6 %   Monocytes Absolute 0.5 0.1 - 1.0 K/uL   Eosinophils Relative 1 %   Eosinophils Absolute 0.1 0.0 - 0.7 K/uL   Basophils Relative 0 %   Basophils Absolute 0.0 0.0 - 0.1 K/uL  Comprehensive metabolic panel  Result Value Ref Range   Sodium 140 135 - 145 mmol/L   Potassium 3.1 (L) 3.5 - 5.1 mmol/L   Chloride 102 101 - 111 mmol/L   CO2 31 22 - 32 mmol/L   Glucose, Bld 225 (H) 65 - 99 mg/dL   BUN 10 6 - 20 mg/dL   Creatinine, Ser 0.92 0.44 - 1.00 mg/dL   Calcium 9.1 8.9 - 10.3 mg/dL   Total Protein 7.2 6.5 - 8.1 g/dL   Albumin 3.8 3.5 - 5.0 g/dL   AST 20 15 - 41 U/L   ALT 15 14 - 54 U/L   Alkaline Phosphatase 40 38 - 126 U/L   Total Bilirubin 0.5 0.3 - 1.2 mg/dL   GFR calc non Af Amer >60 >60 mL/min   GFR calc Af Amer >60 >60 mL/min   Anion gap 7 5 - 15  Magnesium  Result Value Ref Range  Magnesium 1.8 1.7 - 2.4 mg/dL   No results found.  MDM   Final diagnoses:  Hypokalemia   Patient is hemodynamically stable. Potassium today is 3.1. Magnesium 1.8. I will replace the potassium orally.  Patient will increase her potassium to 40 mEq 3 times a day Saturday and Sunday and have her K rechecked on Monday.  This was discussed with the patient and her son.  I personally performed the services described in this documentation, which was scribed in my presence. The recorded information has been reviewed and is  accurate.    Nat Christen, MD 03/03/16 [phone]

## 2016-03-02 NOTE — Discharge Instructions (Signed)
Hypokalemia Hypokalemia means that the amount of potassium in the blood is lower than normal.Potassium is a chemical, called an electrolyte, that helps regulate the amount of fluid in the body. It also stimulates muscle contraction and helps nerves function properly.Most of the body's potassium is inside of cells, and only a very small amount is in the blood. Because the amount in the blood is so small, minor changes can be life-threatening. CAUSES  Antibiotics.  Diarrhea or vomiting.  Using laxatives too much, which can cause diarrhea.  Chronic kidney disease.  Water pills (diuretics).  Eating disorders (bulimia).  Low magnesium level.  Sweating a lot. SIGNS AND SYMPTOMS  Weakness.  Constipation.  Fatigue.  Muscle cramps.  Mental confusion.  Skipped heartbeats or irregular heartbeat (palpitations).  Tingling or numbness. DIAGNOSIS  Your health care provider can diagnose hypokalemia with blood tests. In addition to checking your potassium level, your health care provider may also check other lab tests. TREATMENT Hypokalemia can be treated with potassium supplements taken by mouth or adjustments in your current medicines. If your potassium level is very low, you may need to get potassium through a vein (IV) and be monitored in the hospital. A diet high in potassium is also helpful. Foods high in potassium are:  Nuts, such as peanuts and pistachios.  Seeds, such as sunflower seeds and pumpkin seeds.  Peas, lentils, and lima beans.  Whole grain and bran cereals and breads.  Fresh fruit and vegetables, such as apricots, avocado, bananas, cantaloupe, kiwi, oranges, tomatoes, asparagus, and potatoes.  Orange and tomato juices.  Red meats.  Fruit yogurt. HOME CARE INSTRUCTIONS  Take all medicines as prescribed by your health care provider.  Maintain a healthy diet by including nutritious food, such as fruits, vegetables, nuts, whole grains, and lean meats.  If  you are taking a laxative, be sure to follow the directions on the label. SEEK MEDICAL CARE IF:  Your weakness gets worse.  You feel your heart pounding or racing.  You are vomiting or having diarrhea.  You are diabetic and having trouble keeping your blood glucose in the normal range. SEEK IMMEDIATE MEDICAL CARE IF:  You have chest pain, shortness of breath, or dizziness.  You are vomiting or having diarrhea for more than 2 days.  You faint. MAKE SURE YOU:   Understand these instructions.  Will watch your condition.  Will get help right away if you are not doing well or get worse.   This information is not intended to replace advice given to you by your health care provider. Make sure you discuss any questions you have with your health care provider.   Document Released: 09/10/2005 Document Revised: 10/01/2014 Document Reviewed: 03/13/2013 Elsevier Interactive Patient Education 2016 [place] potassium 40 mEq 3 times a day on Saturday and Sunday. Recheck your blood work on Monday. Google potassium rich foods and try to consume some of these every day.

## 2016-03-02 NOTE — Telephone Encounter (Signed)
This was discussed with Dr Sabra Heck and Florentina Jenny called pt.

## 2016-03-02 NOTE — ED Notes (Signed)
MD at bedside. 

## 2016-03-05 ENCOUNTER — Other Ambulatory Visit: Payer: Commercial Managed Care - HMO

## 2016-03-05 ENCOUNTER — Other Ambulatory Visit: Payer: Self-pay | Admitting: Pharmacist

## 2016-03-05 ENCOUNTER — Other Ambulatory Visit: Payer: Self-pay

## 2016-03-05 DIAGNOSIS — E876 Hypokalemia: Secondary | ICD-10-CM

## 2016-03-05 DIAGNOSIS — D649 Anemia, unspecified: Secondary | ICD-10-CM

## 2016-03-05 DIAGNOSIS — E785 Hyperlipidemia, unspecified: Secondary | ICD-10-CM

## 2016-03-05 LAB — CMP14+EGFR
A/G RATIO: 1.5 (ref 1.2–2.2)
ALT: 15 IU/L (ref 0–32)
AST: 17 IU/L (ref 0–40)
Albumin: 4 g/dL (ref 3.6–4.8)
Alkaline Phosphatase: 45 IU/L (ref 39–117)
BILIRUBIN TOTAL: 0.3 mg/dL (ref 0.0–1.2)
BUN/Creatinine Ratio: 9 — ABNORMAL LOW (ref 12–28)
BUN: 8 mg/dL (ref 8–27)
CHLORIDE: 102 mmol/L (ref 96–106)
CO2: 28 mmol/L (ref 18–29)
Calcium: 9.1 mg/dL (ref 8.7–10.3)
Creatinine, Ser: 0.87 mg/dL (ref 0.57–1.00)
GFR calc Af Amer: 80 mL/min/{1.73_m2} (ref >59)
GFR calc non Af Amer: 69 mL/min/{1.73_m2} (ref >59)
GLOBULIN, TOTAL: 2.7 g/dL (ref 1.5–4.5)
Glucose: 276 mg/dL — ABNORMAL HIGH (ref 65–99)
POTASSIUM: 3.7 mmol/L (ref 3.5–5.2)
SODIUM: 142 mmol/L (ref 134–144)
Total Protein: 6.7 g/dL (ref 6.0–8.5)

## 2016-03-05 LAB — CBC WITH DIFFERENTIAL/PLATELET
Basophils Absolute: 0 10*3/uL (ref 0.0–0.2)
Basos: 0 %
EOS (ABSOLUTE): 0.1 10*3/uL (ref 0.0–0.4)
Eos: 2 %
Hematocrit: 35.2 % (ref 34.0–46.6)
Hemoglobin: 11.9 g/dL (ref 11.1–15.9)
LYMPHS ABS: 2.5 10*3/uL (ref 0.7–3.1)
LYMPHS: 35 %
MCH: 29.9 pg (ref 26.6–33.0)
MCHC: 33.8 g/dL (ref 31.5–35.7)
MCV: 88 fL (ref 79–97)
MONOS ABS: 0.4 10*3/uL (ref 0.1–0.9)
Monocytes: 5 %
NEUTROS ABS: 4.1 10*3/uL (ref 1.4–7.0)
NEUTROS PCT: 58 %
PLATELETS: 168 10*3/uL (ref 150–379)
RBC: 3.98 x10E6/uL (ref 3.77–5.28)
RDW: 14.5 % (ref 12.3–15.4)
WBC: 7.1 10*3/uL (ref 3.4–10.8)

## 2016-03-05 MED ORDER — INSULIN LISPRO 200 UNIT/ML ~~LOC~~ SOPN: 35.0000 [IU] | PEN_INJECTOR | Freq: Three times a day (TID) | SUBCUTANEOUS | Status: DC | PRN

## 2016-03-06 LAB — LIPID PANEL
CHOL/HDL RATIO: 3.6 ratio (ref 0.0–4.4)
CHOLESTEROL TOTAL: 103 mg/dL (ref 100–199)
HDL: 29 mg/dL — ABNORMAL LOW (ref >39)
LDL CALC: 47 mg/dL (ref 0–99)
Triglycerides: 137 mg/dL (ref 0–149)
VLDL Cholesterol Cal: 27 mg/dL (ref 5–40)

## 2016-03-06 LAB — SPECIMEN STATUS REPORT

## 2016-03-12 ENCOUNTER — Encounter: Payer: Self-pay | Admitting: Pharmacist

## 2016-03-12 ENCOUNTER — Ambulatory Visit (INDEPENDENT_AMBULATORY_CARE_PROVIDER_SITE_OTHER): Payer: Commercial Managed Care - HMO | Admitting: Pharmacist

## 2016-03-12 VITALS — BP 150/62 | HR 88 | Ht 66.0 in | Wt 176.0 lb

## 2016-03-12 DIAGNOSIS — E876 Hypokalemia: Secondary | ICD-10-CM

## 2016-03-12 DIAGNOSIS — E1140 Type 2 diabetes mellitus with diabetic neuropathy, unspecified: Secondary | ICD-10-CM

## 2016-03-12 DIAGNOSIS — I10 Essential (primary) hypertension: Secondary | ICD-10-CM | POA: Diagnosis not present

## 2016-03-12 DIAGNOSIS — Z794 Long term (current) use of insulin: Secondary | ICD-10-CM

## 2016-03-12 MED ORDER — INSULIN DEGLUDEC 100 UNIT/ML ~~LOC~~ SOPN: 70.0000 [IU] | PEN_INJECTOR | Freq: Every day | SUBCUTANEOUS | Status: DC

## 2016-03-12 MED ORDER — SPIRONOLACTONE 100 MG PO TABS: 100.0000 mg | ORAL_TABLET | Freq: Every day | ORAL | Status: DC

## 2016-03-12 MED ORDER — METFORMIN HCL ER 500 MG PO TB24: 1000.0000 mg | ORAL_TABLET | Freq: Every day | ORAL | Status: DC

## 2016-03-12 NOTE — Progress Notes (Signed)
Diabetes Follow-Up Visit Chief Complaint:   No chief complaint on file.   HPI: Patient is here today to follow up uncontrolled type 2 diabetes / HTN and Hyperlipidemia She has recently has very low potassium level which required ED visit.  She reports that her energy level has improved since potassium is corrected.   Patient had an insulin pump for about 1 year but in July of 2015 her insurance stopped paying for pump supplies and patient was switched insulin pens.   She is currently using Tresiba 70 units qd. Novolog up to 30 units prior to meals to control BG.  Metfromin XR 528m QD (in past has caused diarrhea but is tolerating well this time)  She has taken Invokana 3050m1 tablet daily in the past but she stopped because she was concerned about side effects she saw on TV.  She has also taken Trulicity but copay was too high.   Home BG Monitoring:  Checking up to 3 times a day.  7 day avg = 211 (last 289)  14 day avg = 252 (last 299) 30 day avg = 273 (last 317) 90 day avg =387 (last  322) Range 137 - 381   Patient has keto sticks at home and tests when BG greater than 400.  She has not had any positive urine ketone readings.  Exam Edema:  negative  Polyuria:  Negative Polydipsia:  Negative             Polyphagia:  negative   Edema - trace bilaterally  Weight changes:  stable General Appearance:  alert, oriented, no acute distress and obese Mood/Affect:  normal  Low fat/carbohydrate diet?  Yes - continues to limit CHO portions sizes; eating more salads and vegetables Nicotine Abuse?  No Medication Compliance?  Yes Exercise?  No but has been more active over the last week. Alcohol Abuse?  No   Lab Results  Component Value Date   HGBA1c 9.4 11/30/2015   HGBA1C 9.3* 09/02/2015   HGBA1c 10.2 04/06/2015          Lipids panel from 02/28/2016 LDL = 47 Tg = 137 Total choletserol = 103 HDL = 29  [facility] 03/12[facility]2016 04/06/2015 01/04/2015 08/30/2014 02/08/2014   Decreased Interest 1 1 0 - 0  Down, Depressed, Hopeless 1 2 0 1 0  PHQ - 2 Score 2 3 0 1 0  Altered sleeping 1 1 - - -  Tired, decreased energy 1 1 - - -  Change in appetite 0 0 - - -  Feeling bad or failure about yourself  0 0 - - -  Trouble concentrating 0 0 - - -  Moving slowly or fidgety/restless 0 0 - - -  Suicidal thoughts 0 0 - - -  PHQ-9 Score 4 5 - - -  Difficult doing work/chores Somewhat difficult Not difficult at all - - -    Assessment: 1.  Diabetes.  Uncontrolled - HBG readings are improving 2.  Dyslipidemia - LDL and triglyerides at goal and HDL low. Patient on statin. 3.  HTN - elevated today 4.  Hypokalemia -  Improved 5.  Edema - improved   Recommendations: 1.  Medication recommendations at this time are as follows:    Increase metformin XR 50031make 2 tablets daily with breakfast.  Continue Tresiba 70 units QD (gave #2 pens today) - waiting on insurance to verify sheis in coverage gap and [facility]  Continue  Novolog 30 units tid prior to meal and 10 units as needed for BG greater than 300.  Patient to call if gets more than 1 BG reading greater than 300 for insulin adjustment  Increase spironlactone to 156m (take 2 tablets of the 53m daily    2.  Revewed HBG goals:  Fasting 80-130 and 1-2 hour post prandial <180.  Patient is instructed to check BG 3 to 5  times per day.   3.  BP goal < 140/80.  4.  Return to clinic 1 month to follow up diabtes with PCP and will see me in 2-3 months for AWV  Orders Placed This Encounter  Procedures  . BMP8+EGFR    Time spent counseling patient:  45 minutes   TaCherre RobinsPharmD, CPP, CDE

## 2016-03-13 ENCOUNTER — Other Ambulatory Visit: Payer: Self-pay | Admitting: Pharmacist

## 2016-03-13 DIAGNOSIS — Z8639 Personal history of other endocrine, nutritional and metabolic disease: Secondary | ICD-10-CM

## 2016-03-13 LAB — BMP8+EGFR
BUN/Creatinine Ratio: 11 — ABNORMAL LOW (ref 12–28)
BUN: 10 mg/dL (ref 8–27)
CALCIUM: 9.2 mg/dL (ref 8.7–10.3)
CHLORIDE: 100 mmol/L (ref 96–106)
CO2: 27 mmol/L (ref 18–29)
Creatinine, Ser: 0.95 mg/dL (ref 0.57–1.00)
GFR, EST AFRICAN AMERICAN: 72 mL/min/{1.73_m2} (ref >59)
GFR, EST NON AFRICAN AMERICAN: 62 mL/min/{1.73_m2} (ref >59)
Glucose: 273 mg/dL — ABNORMAL HIGH (ref 65–99)
POTASSIUM: 4.1 mmol/L (ref 3.5–5.2)
SODIUM: 143 mmol/L (ref 134–144)

## 2016-03-21 ENCOUNTER — Other Ambulatory Visit: Payer: Self-pay

## 2016-04-03 ENCOUNTER — Telehealth: Payer: Self-pay | Admitting: Family Medicine

## 2016-04-03 MED ORDER — INSULIN ASPART 100 UNIT/ML FLEXPEN: PEN_INJECTOR | SUBCUTANEOUS | Status: DC

## 2016-04-03 NOTE — Telephone Encounter (Signed)
Only had #1 pen of Novolog - left for patient to pick up and patient notified.

## 2016-04-04 ENCOUNTER — Telehealth: Payer: Self-pay | Admitting: Family Medicine

## 2016-04-05 NOTE — Telephone Encounter (Signed)
Insulin paperwork has Dr Tawanna Sat name and he is out until Monday, July 17th.  Sonja states any MD can sign just up date name and DEA number on Rx.  Rx Humalog signed by Chevis Pretty, NP and [facility].

## 2016-04-09 ENCOUNTER — Other Ambulatory Visit: Payer: Commercial Managed Care - HMO

## 2016-04-09 DIAGNOSIS — Z8639 Personal history of other endocrine, nutritional and metabolic disease: Secondary | ICD-10-CM

## 2016-04-09 LAB — BMP8+EGFR
BUN / CREAT RATIO: 10 — AB (ref 12–28)
BUN: 9 mg/dL (ref 8–27)
CALCIUM: 9.5 mg/dL (ref 8.7–10.3)
CO2: 31 mmol/L — ABNORMAL HIGH (ref 18–29)
CREATININE: 0.86 mg/dL (ref 0.57–1.00)
Chloride: 97 mmol/L (ref 96–106)
GFR, EST AFRICAN AMERICAN: 81 mL/min/{1.73_m2} (ref >59)
GFR, EST NON AFRICAN AMERICAN: 70 mL/min/{1.73_m2} (ref >59)
GLUCOSE: 150 mg/dL — AB (ref 65–99)
Potassium: 3.9 mmol/L (ref 3.5–5.2)
Sodium: 142 mmol/L (ref 134–144)

## 2016-04-12 ENCOUNTER — Other Ambulatory Visit: Payer: Self-pay

## 2016-04-12 ENCOUNTER — Telehealth: Payer: Self-pay | Admitting: Family Medicine

## 2016-04-12 ENCOUNTER — Other Ambulatory Visit: Payer: Self-pay | Admitting: Family Medicine

## 2016-04-12 DIAGNOSIS — I200 Unstable angina: Secondary | ICD-10-CM

## 2016-04-12 NOTE — Telephone Encounter (Signed)
Pharmacy has a refill available and pt is aware.

## 2016-04-12 NOTE — Telephone Encounter (Signed)
He is actually working in the hospital more and seeing fewer patients on an outpatient basis. He can try to do a referral but he will probably recommend that she continue to be followed by Dr. Percival Spanish

## 2016-04-12 NOTE — Telephone Encounter (Signed)
[place] for PCP  She was given 90 with 1 refill (6 months Rx) in March, so I do not think she should need to get a refill right now.   Laroy Apple, MD [facility] 04/12/2016, 3:18 PM

## 2016-04-12 NOTE — Telephone Encounter (Signed)
Last seen 11/23/15  DWM  If approved route to nurse to [facility]

## 2016-04-13 NOTE — Telephone Encounter (Signed)
Pt states she absolutely refuses to see Dr.Hochrein. Advised pt of MD feedback and pt would like [name] to try to go ahead with the referral.

## 2016-04-17 ENCOUNTER — Telehealth: Payer: Self-pay | Admitting: Family Medicine

## 2016-04-17 ENCOUNTER — Encounter: Payer: Self-pay | Admitting: Family Medicine

## 2016-04-17 ENCOUNTER — Ambulatory Visit (INDEPENDENT_AMBULATORY_CARE_PROVIDER_SITE_OTHER): Payer: Commercial Managed Care - HMO | Admitting: Family Medicine

## 2016-04-17 VITALS — BP 135/77 | HR 89 | Temp 97.5°F | Ht 66.0 in | Wt 176.0 lb

## 2016-04-17 DIAGNOSIS — E785 Hyperlipidemia, unspecified: Secondary | ICD-10-CM | POA: Diagnosis not present

## 2016-04-17 DIAGNOSIS — Z794 Long term (current) use of insulin: Secondary | ICD-10-CM | POA: Diagnosis not present

## 2016-04-17 DIAGNOSIS — E1140 Type 2 diabetes mellitus with diabetic neuropathy, unspecified: Secondary | ICD-10-CM

## 2016-04-17 DIAGNOSIS — R0789 Other chest pain: Secondary | ICD-10-CM | POA: Diagnosis not present

## 2016-04-17 DIAGNOSIS — K219 Gastro-esophageal reflux disease without esophagitis: Secondary | ICD-10-CM

## 2016-04-17 DIAGNOSIS — E559 Vitamin D deficiency, unspecified: Secondary | ICD-10-CM | POA: Diagnosis not present

## 2016-04-17 DIAGNOSIS — I10 Essential (primary) hypertension: Secondary | ICD-10-CM | POA: Diagnosis not present

## 2016-04-17 MED ORDER — ISOSORBIDE MONONITRATE ER 120 MG PO TB24: 120.0000 mg | ORAL_TABLET | Freq: Every day | ORAL | 1 refills | Status: DC

## 2016-04-17 MED ORDER — CLOPIDOGREL BISULFATE 75 MG PO TABS: 75.0000 mg | ORAL_TABLET | Freq: Every day | ORAL | 3 refills | Status: DC

## 2016-04-17 MED ORDER — METFORMIN HCL ER 500 MG PO TB24: 1000.0000 mg | ORAL_TABLET | Freq: Every day | ORAL | 1 refills | Status: DC

## 2016-04-17 MED ORDER — FUROSEMIDE 40 MG PO TABS: 40.0000 mg | ORAL_TABLET | Freq: Every day | ORAL | 2 refills | Status: DC

## 2016-04-17 MED ORDER — LISINOPRIL 40 MG PO TABS: 40.0000 mg | ORAL_TABLET | Freq: Every day | ORAL | 1 refills | Status: DC

## 2016-04-17 MED ORDER — ATENOLOL 50 MG PO TABS: 50.0000 mg | ORAL_TABLET | Freq: Two times a day (BID) | ORAL | 2 refills | Status: DC

## 2016-04-17 MED ORDER — POTASSIUM CHLORIDE CRYS ER 20 MEQ PO TBCR: 20.0000 meq | EXTENDED_RELEASE_TABLET | Freq: Two times a day (BID) | ORAL | 1 refills | Status: DC

## 2016-04-17 MED ORDER — ROSUVASTATIN CALCIUM 40 MG PO TABS: 40.0000 mg | ORAL_TABLET | Freq: Every day | ORAL | 3 refills | Status: DC

## 2016-04-17 MED ORDER — CITALOPRAM HYDROBROMIDE 20 MG PO TABS: 20.0000 mg | ORAL_TABLET | Freq: Every day | ORAL | 1 refills | Status: DC

## 2016-04-17 MED ORDER — VITAMIN D (ERGOCALCIFEROL) 1.25 MG (50000 UNIT) PO CAPS: 50000.0000 [IU] | ORAL_CAPSULE | ORAL | 1 refills | Status: DC

## 2016-04-17 NOTE — Progress Notes (Signed)
Subjective:    Patient ID: [name], female    DOB: 1950-11-25, 65 y.o.   MRN: [id number]  HPI Pt here for follow up and management of chronic medical problems which includes diabetes, hypertension, and hyperlipidemia. She is taking medications regularly.The patient continues to have ongoing chest pressure. She is requesting a visit with the cardiologist. We will try to arrange this and get this visit as soon as possible. She is also been seeing the clinical pharmacists because of an elevated A1c at 8.1%. She continues to follow-up with her for better blood sugar control. The patient continues to have chest pressure which is ongoing in nature sometimes affecting the left arm and hurting in her back. There is some shortness of breath associated with this. There is no nausea and vomiting or diarrhea. Nitroglycerin relieves. She recently got a new prescription for this. She denies any heartburn indigestion nausea or vomiting. She's not had any blood in the stool or black tarry bowel movements. She is passing her water without any burning pain or frequency. She is calm and alert.     Patient Active Problem List   Diagnosis Date Noted  . B12 deficiency 01/13/2016  . Facial droop 09/01/2015  . Stroke ([place]) 09/01/2015  . TIA (transient ischemic attack) 08/14/2014  . DM type 2 with diabetic dyslipidemia ([place]) 08/14/2014  . Cataract 02/08/2014  . Macular degeneration 02/08/2014  . Elevated d-dimer, neg VQ scan 01/08/2014  . Unstable angina ([place]) 01/07/2014  . Chest pain 12/30/2013  . Generalized anxiety disorder 09/14/2013  . Claudication ([place]) 06/18/2013  . Diverticulosis of colon 01/01/2012  . Left lower quadrant pain 01/01/2012  . GERD (gastroesophageal reflux disease) 01/01/2012  . Constipation 01/01/2012  . Hypokalemia 08/24/2011  . Uncontrolled type 2 diabetes mellitus with insulin therapy ([place]) 08/23/2011  . Essential hypertension, benign 08/23/2011  . PALPITATIONS 02/10/2010  .  Hyperlipidemia 05/10/2009  . DEPRESSION 05/10/2009  . CAD, NATIVE VESSEL, cath 01/07/14 non obstructive coronary disease 08/26/2008  . Carotid artery disease ([place]) 08/26/2008   Outpatient Encounter Prescriptions as of 04/17/2016  Medication Sig  . ALPRAZolam (XANAX) 0.5 MG tablet TAKE ONE TABLET 3 TIMES A DAY AS NEEDED. (Patient taking differently: Take 0.5 mg by mouth 3 (three) times daily as needed for anxiety or sleep. )  . aspirin EC 325 MG tablet Take 325 mg by mouth daily.  Marland Kitchen atenolol (TENORMIN) 50 MG tablet Take 1 tablet (50 mg total) by mouth 2 (two) times daily.  . citalopram (CELEXA) 20 MG tablet Take 1 tablet (20 mg total) by mouth daily.  . clopidogrel (PLAVIX) 75 MG tablet Take 1 tablet (75 mg total) by mouth daily.  . furosemide (LASIX) 40 MG tablet Take 40 mg by mouth daily.  . insulin aspart (NOVOLOG FLEXPEN) 100 UNIT/ML FlexPen INJECT 35 UNITS SQ 3 TIMES DAILY  . Insulin Degludec (TRESIBA FLEXTOUCH) 100 UNIT/ML SOPN Inject 70 Units into the skin daily.  . Insulin Lispro (HUMALOG KWIKPEN) 200 UNIT/ML SOPN Inject 35 Units into the skin 3 (three) times daily as needed.  . isosorbide mononitrate (IMDUR) 120 MG 24 hr tablet Take 1 tablet (120 mg total) by mouth daily.  Marland Kitchen lisinopril (PRINIVIL,ZESTRIL) 40 MG tablet Take 1 tablet (40 mg total) by mouth daily.  . metFORMIN (GLUCOPHAGE-XR) 500 MG 24 hr tablet Take 2 tablets (1,000 mg total) by mouth daily with breakfast.  . NITROSTAT 0.4 MG SL tablet DISSOLVE 1 TAB UNDER TOUNGE FOR CHEST PAIN. MAY REPEAT EVERY 5 MINUTES  FOR 3 DOSES. IF NO RELIEF CALL 911 OR GO TO ER  . potassium chloride SA (K-DUR,KLOR-CON) 20 MEQ tablet Take 1 tablet (20 mEq total) by mouth 2 (two) times daily.  . rosuvastatin (CRESTOR) 40 MG tablet Take 1 tablet (40 mg total) by mouth daily.  . vitamin B-12 (CYANOCOBALAMIN) 1000 MCG tablet Take 1,000 mcg by mouth daily.  . Vitamin D, Ergocalciferol, (DRISDOL) 50000 UNITS CAPS capsule Take 1 capsule (50,000 Units total)  by mouth every 7 (seven) days.  Marland Kitchen zolpidem (AMBIEN) 10 MG tablet TAKE 1/2 TO 1 TABLET AT BEDTIME AS NEEDED (Patient taking differently: Take 5-10 mg by mouth at bedtime as needed for sleep. TAKE 1/2 TO 1 TABLET AT BEDTIME AS NEEDED)  . [DISCONTINUED] spironolactone (ALDACTONE) 100 MG tablet Take 1 tablet (100 mg total) by mouth daily.   No facility-administered encounter medications on file as of 04/17/2016.       Review of Systems  Constitutional: Negative.   HENT: Negative.   Eyes: Negative.   Respiratory: Negative.   Cardiovascular: Negative.        On going chest pressure - cardio referral  Gastrointestinal: Negative.   Endocrine: Negative.   Genitourinary: Negative.   Musculoskeletal: Negative.   Skin: Negative.   Allergic/Immunologic: Negative.   Neurological: Negative.   Hematological: Negative.   Psychiatric/Behavioral: Negative.        Objective:   Physical Exam  Constitutional: She is oriented to person, place, and time. She appears well-developed and well-nourished.  HENT:  Head: Normocephalic and atraumatic.  Right Ear: External ear normal.  Left Ear: External ear normal.  Mouth/Throat: Oropharynx is clear and moist.  Slight nasal congestion on the right  Eyes: Conjunctivae and EOM are normal. Pupils are equal, round, and reactive to light. Right eye exhibits no discharge. Left eye exhibits no discharge. No scleral icterus.  Neck: Normal range of motion. Neck supple. No thyromegaly present.  Cardiovascular: Normal rate, regular rhythm and intact distal pulses.   No murmur heard. The heart is regular at 84/m  Pulmonary/Chest: Effort normal and breath sounds normal. No respiratory distress. She has no wheezes. She has no rales.  Clear anteriorly and posteriorly  Abdominal: Soft. Bowel sounds are normal. She exhibits no mass. There is no tenderness. There is no rebound and no guarding.  No epigastric or suprapubic tenderness. No liver or spleen enlargement. No  abdominal bruits. No masses.  Musculoskeletal: Normal range of motion. She exhibits no edema or deformity.  Lymphadenopathy:    She has no cervical adenopathy.  Neurological: She is alert and oriented to person, place, and time. She has normal reflexes. No cranial nerve deficit.  Skin: Skin is warm and dry. No rash noted.  Psychiatric: She has a normal mood and affect. Her behavior is normal. Judgment and thought content normal.  Nursing note and vitals reviewed.  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 Type 2 diabetes mellitus with diabetic neuropathy, with long-term current use of insulin ([place]) -Continue to follow-up with clinical pharmacy and continue with current medicines. - Microalbumin / creatinine urine ratio  2. Essential hypertension, benign -The blood pressure is good today and she should continue with current treatment  3. Hyperlipemia -  Continue with current treatment and aggressive therapeutic lifestyle changes  4. Vitamin D deficiency -Continue with current treatment  5. Gastroesophageal reflux disease, esophagitis presence not specified -The patient is doing well with this currently and will continue with current treatment  6. Chest tightness or pressure -We will arrange an appointment with the cardiologist for further follow-up and get an EKG today.   Patient Instructions                       Medicare Annual Wellness Visit  [place] and the medical providers at [facility] strive to bring you the best medical care.  In doing so we not only want to address your current medical conditions and concerns but also to detect new conditions early and prevent illness, disease and health-related problems.    Medicare offers a yearly Wellness Visit which allows our  clinical staff to assess your need for preventative services including immunizations, lifestyle education, counseling to decrease risk of preventable diseases and screening for fall risk and other medical concerns.    This visit is provided free of charge (no copay) for all Medicare recipients. The clinical pharmacists at [facility] have begun to conduct these Wellness Visits which will also include a thorough review of all your medications.    As you primary medical provider recommend that you make an appointment for your Annual Wellness Visit if you have not done so already this year.  You may set up this appointment before you leave today or you may call back [phone] and schedule an appointment.  Please make sure when you call that you mention that you are scheduling your Annual Wellness Visit with the clinical pharmacist so that the appointment may be made for the proper length of time.      Continue current medications. Continue good therapeutic lifestyle changes which include good diet and exercise. Fall precautions discussed with patient. If an FOBT was given today- please return it to our front desk. If you are over 24 years old - you may need Prevnar 83 or the adult Pneumonia vaccine.   After your visit with [name] today you will receive a survey in the mail or online from [employer] regarding your care with [name]. Please take a moment to fill this out. Your feedback is very important to [name] as you can help [name] better understand your patient needs as well as improve your experience and satisfaction. WE CARE ABOUT YOU!!!   We will arrange for you to see the cardiologist as soon as possible. The patient should continue to follow-up with the clinical pharmacists because of her elevated hemoglobin A1c. She should continue to take her current medicines.  Arrie Senate MD

## 2016-04-17 NOTE — Telephone Encounter (Signed)
Patient states that she has an appt with Dr. Domenic Polite.

## 2016-04-17 NOTE — Patient Instructions (Addendum)
Medicare Annual Wellness Visit  [place] and the medical providers at [facility] strive to bring you the best medical care.  In doing so we not only want to address your current medical conditions and concerns but also to detect new conditions early and prevent illness, disease and health-related problems.    Medicare offers a yearly Wellness Visit which allows our clinical staff to assess your need for preventative services including immunizations, lifestyle education, counseling to decrease risk of preventable diseases and screening for fall risk and other medical concerns.    This visit is provided free of charge (no copay) for all Medicare recipients. The clinical pharmacists at [facility] have begun to conduct these Wellness Visits which will also include a thorough review of all your medications.    As you primary medical provider recommend that you make an appointment for your Annual Wellness Visit if you have not done so already this year.  You may set up this appointment before you leave today or you may call back [phone] and schedule an appointment.  Please make sure when you call that you mention that you are scheduling your Annual Wellness Visit with the clinical pharmacist so that the appointment may be made for the proper length of time.      Continue current medications. Continue good therapeutic lifestyle changes which include good diet and exercise. Fall precautions discussed with patient. If an FOBT was given today- please return it to our front desk. If you are over 75 years old - you may need Prevnar 20 or the adult Pneumonia vaccine.   After your visit with [name] today you will receive a survey in the mail or online from [employer] regarding your care with [name]. Please take a moment to fill this out. Your feedback is very important to [name] as you can help [name] better understand your patient needs as well as  improve your experience and satisfaction. WE CARE ABOUT YOU!!!   We will arrange for you to see the cardiologist as soon as possible. The patient should continue to follow-up with the clinical pharmacists because of her elevated hemoglobin A1c. She should continue to take her current medicines.

## 2016-04-18 LAB — MICROALBUMIN / CREATININE URINE RATIO
CREATININE, UR: 206.5 mg/dL
MICROALB/CREAT RATIO: 15.2 mg/g creat (ref 0.0–30.0)
MICROALBUM., U, RANDOM: 31.3 ug/mL

## 2016-04-20 ENCOUNTER — Ambulatory Visit: Payer: Commercial Managed Care - HMO | Admitting: Family Medicine

## 2016-05-21 ENCOUNTER — Telehealth: Payer: Self-pay | Admitting: Family Medicine

## 2016-05-21 MED ORDER — INSULIN ASPART 100 UNIT/ML FLEXPEN: PEN_INJECTOR | SUBCUTANEOUS | 0 refills | Status: DC

## 2016-05-21 NOTE — Telephone Encounter (Signed)
#  2 Novolog pens left for patient.  Also called her insurance - [employer].  Patient needs letter verifying that she is in Medicare coverage gap so she can request medication assistance from drug company.  Was told that patient has to request herself or she could get from [email or web address].  Will try to help patient with this.

## 2016-05-22 ENCOUNTER — Ambulatory Visit: Payer: Commercial Managed Care - HMO | Admitting: Cardiology

## 2016-05-31 ENCOUNTER — Other Ambulatory Visit: Payer: Self-pay

## 2016-05-31 MED ORDER — ALPRAZOLAM 0.5 MG PO TABS: 0.5000 mg | ORAL_TABLET | Freq: Three times a day (TID) | ORAL | 1 refills | Status: DC | PRN

## 2016-05-31 NOTE — Telephone Encounter (Signed)
Please call in xanax with 1 refills 

## 2016-06-06 ENCOUNTER — Telehealth: Payer: Self-pay | Admitting: Family Medicine

## 2016-06-07 ENCOUNTER — Encounter: Payer: Self-pay | Admitting: Nurse Practitioner

## 2016-06-07 ENCOUNTER — Ambulatory Visit (INDEPENDENT_AMBULATORY_CARE_PROVIDER_SITE_OTHER): Payer: Commercial Managed Care - HMO | Admitting: Nurse Practitioner

## 2016-06-07 ENCOUNTER — Other Ambulatory Visit: Payer: Self-pay | Admitting: *Deleted

## 2016-06-07 VITALS — BP 144/74 | HR 71 | Temp 97.5°F | Ht 66.0 in | Wt 182.0 lb

## 2016-06-07 DIAGNOSIS — R102 Pelvic and perineal pain: Secondary | ICD-10-CM

## 2016-06-07 DIAGNOSIS — K5732 Diverticulitis of large intestine without perforation or abscess without bleeding: Secondary | ICD-10-CM | POA: Diagnosis not present

## 2016-06-07 LAB — URINALYSIS, COMPLETE
Bilirubin, UA: NEGATIVE
KETONES UA: NEGATIVE
LEUKOCYTES UA: NEGATIVE
Nitrite, UA: NEGATIVE
Protein, UA: NEGATIVE
SPEC GRAV UA: 1.015 (ref 1.005–1.030)
Urobilinogen, Ur: 1 mg/dL (ref 0.2–1.0)
pH, UA: 7 (ref 5.0–7.5)

## 2016-06-07 LAB — MICROSCOPIC EXAMINATION

## 2016-06-07 MED ORDER — METRONIDAZOLE 500 MG PO TABS: 500.0000 mg | ORAL_TABLET | Freq: Two times a day (BID) | ORAL | 0 refills | Status: DC

## 2016-06-07 MED ORDER — CLOPIDOGREL BISULFATE 75 MG PO TABS: 75.0000 mg | ORAL_TABLET | Freq: Every day | ORAL | 3 refills | Status: DC

## 2016-06-07 MED ORDER — LISINOPRIL 40 MG PO TABS
40.0000 mg | ORAL_TABLET | Freq: Every day | ORAL | 1 refills | Status: DC
Start: 2016-06-07 — End: 2017-01-16

## 2016-06-07 MED ORDER — ISOSORBIDE MONONITRATE ER 120 MG PO TB24: 120.0000 mg | ORAL_TABLET | Freq: Every day | ORAL | 1 refills | Status: DC

## 2016-06-07 MED ORDER — FUROSEMIDE 40 MG PO TABS: 40.0000 mg | ORAL_TABLET | Freq: Every day | ORAL | 2 refills | Status: DC

## 2016-06-07 MED ORDER — INSULIN DEGLUDEC 100 UNIT/ML ~~LOC~~ SOPN: 70.0000 [IU] | PEN_INJECTOR | Freq: Every day | SUBCUTANEOUS | 0 refills | Status: DC

## 2016-06-07 MED ORDER — ATENOLOL 50 MG PO TABS
50.0000 mg | ORAL_TABLET | Freq: Two times a day (BID) | ORAL | 2 refills | Status: DC
Start: 2016-06-07 — End: 2017-11-14

## 2016-06-07 MED ORDER — INSULIN ASPART 100 UNIT/ML FLEXPEN: PEN_INJECTOR | SUBCUTANEOUS | 11 refills | Status: DC

## 2016-06-07 MED ORDER — ROSUVASTATIN CALCIUM 40 MG PO TABS: 40.0000 mg | ORAL_TABLET | Freq: Every day | ORAL | 3 refills | Status: DC

## 2016-06-07 MED ORDER — INSULIN ASPART 100 UNIT/ML FLEXPEN: PEN_INJECTOR | SUBCUTANEOUS | 0 refills | Status: DC

## 2016-06-07 MED ORDER — ALPRAZOLAM 0.5 MG PO TABS: 0.5000 mg | ORAL_TABLET | Freq: Three times a day (TID) | ORAL | 1 refills | Status: DC | PRN

## 2016-06-07 MED ORDER — INSULIN DEGLUDEC 100 UNIT/ML ~~LOC~~ SOPN: 70.0000 [IU] | PEN_INJECTOR | Freq: Every day | SUBCUTANEOUS | 11 refills | Status: DC

## 2016-06-07 MED ORDER — VITAMIN D (ERGOCALCIFEROL) 1.25 MG (50000 UNIT) PO CAPS: 50000.0000 [IU] | ORAL_CAPSULE | ORAL | 1 refills | Status: DC

## 2016-06-07 MED ORDER — CIPROFLOXACIN HCL 500 MG PO TABS: 500.0000 mg | ORAL_TABLET | Freq: Two times a day (BID) | ORAL | 0 refills | Status: DC

## 2016-06-07 MED ORDER — METFORMIN HCL ER 500 MG PO TB24: 1000.0000 mg | ORAL_TABLET | Freq: Every day | ORAL | 1 refills | Status: DC

## 2016-06-07 MED ORDER — ZOLPIDEM TARTRATE 10 MG PO TABS: ORAL_TABLET | ORAL | 1 refills | Status: DC

## 2016-06-07 MED ORDER — CITALOPRAM HYDROBROMIDE 20 MG PO TABS: 20.0000 mg | ORAL_TABLET | Freq: Every day | ORAL | 2 refills | Status: DC

## 2016-06-07 MED ORDER — POTASSIUM CHLORIDE CRYS ER 20 MEQ PO TBCR: 20.0000 meq | EXTENDED_RELEASE_TABLET | Freq: Two times a day (BID) | ORAL | 1 refills | Status: DC

## 2016-06-07 MED ORDER — NITROGLYCERIN 0.4 MG SL SUBL: SUBLINGUAL_TABLET | SUBLINGUAL | 6 refills | Status: DC

## 2016-06-07 NOTE — Patient Instructions (Signed)
Diverticulitis °Diverticulitis is inflammation or infection of small pouches in your colon that form when you have a condition called diverticulosis. The pouches in your colon are called diverticula. Your colon, or large intestine, is where water is absorbed and stool is formed. °Complications of diverticulitis can include: °· Bleeding. °· Severe infection. °· Severe pain. °· Perforation of your colon. °· Obstruction of your colon. °CAUSES  °Diverticulitis is caused by bacteria. °Diverticulitis happens when stool becomes trapped in diverticula. This allows bacteria to grow in the diverticula, which can lead to inflammation and infection. °RISK FACTORS °People with diverticulosis are at risk for diverticulitis. Eating a diet that does not include enough fiber from fruits and vegetables may make diverticulitis more likely to develop. °SYMPTOMS  °Symptoms of diverticulitis may include: °· Abdominal pain and tenderness. The pain is normally located on the left side of the abdomen, but may occur in other areas. °· Fever and chills. °· Bloating. °· Cramping. °· Nausea. °· Vomiting. °· Constipation. °· Diarrhea. °· Blood in your stool. °DIAGNOSIS  °Your health care provider will ask you about your medical history and do a physical exam. You may need to have tests done because many medical conditions can cause the same symptoms as diverticulitis. Tests may include: °· Blood tests. °· Urine tests. °· Imaging tests of the abdomen, including X-rays and CT scans. °When your condition is under control, your health care provider may recommend that you have a colonoscopy. A colonoscopy can show how severe your diverticula are and whether something else is causing your symptoms. °TREATMENT  °Most cases of diverticulitis are mild and can be treated at home. Treatment may include: °· Taking over-the-counter pain medicines. °· Following a clear liquid diet. °· Taking antibiotic medicines by mouth for 7-10 days. °More severe cases may  be treated at a hospital. Treatment may include: °· Not eating or drinking. °· Taking prescription pain medicine. °· Receiving antibiotic medicines through an IV tube. °· Receiving fluids and nutrition through an IV tube. °· Surgery. °HOME CARE INSTRUCTIONS  °· Follow your health care provider's instructions carefully. °· Follow a full liquid diet or other diet as directed by your health care provider. After your symptoms improve, your health care provider may tell you to change your diet. He or she may recommend you eat a high-fiber diet. Fruits and vegetables are good sources of fiber. Fiber makes it easier to pass stool. °· Take fiber supplements or probiotics as directed by your health care provider. °· Only take medicines as directed by your health care provider. °· Keep all your follow-up appointments. °SEEK MEDICAL CARE IF:  °· Your pain does not improve. °· You have a hard time eating food. °· Your bowel movements do not return to normal. °SEEK IMMEDIATE MEDICAL CARE IF:  °· Your pain becomes worse. °· Your symptoms do not get better. °· Your symptoms suddenly get worse. °· You have a fever. °· You have repeated vomiting. °· You have bloody or black, tarry stools. °MAKE SURE YOU:  °· Understand these instructions. °· Will watch your condition. °· Will get help right away if you are not doing well or get worse. °  °This information is not intended to replace advice given to you by your health care provider. Make sure you discuss any questions you have with your health care provider. °  °Document Released: 06/20/2005 Document Revised: 09/15/2013 Document Reviewed: 08/05/2013 °Elsevier Interactive Patient Education ©2016 [employer] Inc[employer] ° °

## 2016-06-07 NOTE — Progress Notes (Signed)
   Subjective:    Patient ID: [name], female    DOB: [birth year], 65 y.o.   MRN: [id number]  HPI  Patient comes in today c/o suprapubic pain. She had diarrhea Saturday morning and pain started late Saturday evening. SHe does have urinary frequency and urgency. No dysuria. Has history of diverticulitis in the ast and pain is simiar.   Review of Systems  Constitutional: Positive for fever. Negative for appetite change.  HENT: Negative.   Respiratory: Negative.   Cardiovascular: Negative.   Genitourinary: Positive for frequency and urgency. Negative for dysuria.  Neurological: Negative.   Psychiatric/Behavioral: Negative.        Objective:   Physical Exam  Constitutional: She is oriented to person, place, and time. She appears well-developed and well-nourished.  Cardiovascular: Normal rate, regular rhythm and normal heart sounds.   Pulmonary/Chest: Effort normal and breath sounds normal. Right breast exhibits no inverted nipple, no mass, no nipple discharge, no skin change and no tenderness. Left breast exhibits no inverted nipple, no mass, no nipple discharge, no skin change and no tenderness. Breasts are symmetrical.  Genitourinary: Vagina normal.  Genitourinary Comments: Vaginal cuff intact No adnexal mass or tenderness Large cystocele.  Neurological: She is alert and oriented to person, place, and time.  Skin: Skin is warm.  Psychiatric: She has a normal mood and affect. Her behavior is normal. Judgment and thought content normal.   BP (!) 144/74 (BP Location: Left Arm, Cuff Size: Normal)   Pulse 71   Temp 97.5 F (36.4 C) (Oral)   Ht 5\' 6"  (1.676 m)   Wt 182 lb (82.6 kg)   BMI 29.38 kg/m   Urine clear    Assessment & Plan:  1. Pelvic pain in female Urine clear - Urinalysis, Complete  2. Diverticulitis of colon without hemorrhage Watch diet- avoidthings with small seeds or foods with indigestible skin - ciprofloxacin (CIPRO) 500 MG tablet; Take 1 tablet (500 mg  total) by mouth 2 (two) times daily.  Dispense: 20 tablet; Refill: 0 - metroNIDAZOLE (FLAGYL) 500 MG tablet; Take 1 tablet (500 mg total) by mouth 2 (two) times daily.  Dispense: 14 tablet; Refill: 0 - CBC with Differential/Platelet If no improvement in 3 days will need to do pelvic U/S  Mary-Margaret Hassell Done, FNP

## 2016-06-07 NOTE — Telephone Encounter (Signed)
#  2 pens of tresiba left for patient and #1 pen of Novolog Patient notified of samples and reminded to follow up with referal to [facility] P[facility].  I faxed insurance information to them 1 week ago.

## 2016-06-08 ENCOUNTER — Telehealth: Payer: Self-pay | Admitting: Family Medicine

## 2016-06-08 LAB — CBC WITH DIFFERENTIAL/PLATELET
BASOS ABS: 0 10*3/uL (ref 0.0–0.2)
BASOS: 0 %
EOS (ABSOLUTE): 0.2 10*3/uL (ref 0.0–0.4)
Eos: 2 %
Hematocrit: 37 % (ref 34.0–46.6)
Hemoglobin: 12.5 g/dL (ref 11.1–15.9)
IMMATURE GRANULOCYTES: 0 %
Immature Grans (Abs): 0 10*3/uL (ref 0.0–0.1)
Lymphocytes Absolute: 2.9 10*3/uL (ref 0.7–3.1)
Lymphs: 43 %
MCH: 28.6 pg (ref 26.6–33.0)
MCHC: 33.8 g/dL (ref 31.5–35.7)
MCV: 85 fL (ref 79–97)
MONOS ABS: 0.4 10*3/uL (ref 0.1–0.9)
Monocytes: 5 %
NEUTROS PCT: 50 %
Neutrophils Absolute: 3.4 10*3/uL (ref 1.4–7.0)
PLATELETS: 187 10*3/uL (ref 150–379)
RBC: 4.37 x10E6/uL (ref 3.77–5.28)
RDW: 15.5 % — AB (ref 12.3–15.4)
WBC: 6.8 10*3/uL (ref 3.4–10.8)

## 2016-06-08 NOTE — Telephone Encounter (Signed)
appt made

## 2016-06-11 LAB — PAP IG (IMAGE GUIDED): PAP Smear Comment: 0

## 2016-06-12 ENCOUNTER — Ambulatory Visit: Payer: Commercial Managed Care - HMO

## 2016-06-19 ENCOUNTER — Ambulatory Visit: Payer: Commercial Managed Care - HMO | Admitting: Cardiology

## 2016-06-21 ENCOUNTER — Encounter: Payer: Self-pay | Admitting: Pharmacist

## 2016-06-21 ENCOUNTER — Ambulatory Visit (INDEPENDENT_AMBULATORY_CARE_PROVIDER_SITE_OTHER): Payer: Commercial Managed Care - HMO | Admitting: Pharmacist

## 2016-06-21 VITALS — BP 122/70 | HR 60 | Ht 66.0 in | Wt 182.5 lb

## 2016-06-21 DIAGNOSIS — Z0000 Encounter for general adult medical examination without abnormal findings: Secondary | ICD-10-CM

## 2016-06-21 DIAGNOSIS — E1165 Type 2 diabetes mellitus with hyperglycemia: Secondary | ICD-10-CM

## 2016-06-21 DIAGNOSIS — Z794 Long term (current) use of insulin: Secondary | ICD-10-CM

## 2016-06-21 DIAGNOSIS — Z8639 Personal history of other endocrine, nutritional and metabolic disease: Secondary | ICD-10-CM

## 2016-06-21 DIAGNOSIS — R7989 Other specified abnormal findings of blood chemistry: Secondary | ICD-10-CM

## 2016-06-21 DIAGNOSIS — Z1159 Encounter for screening for other viral diseases: Secondary | ICD-10-CM

## 2016-06-21 LAB — BAYER DCA HB A1C WAIVED: HB A1C (BAYER DCA - WAIVED): 8.7 % — ABNORMAL HIGH (ref <7.0)

## 2016-06-21 NOTE — Patient Instructions (Addendum)
[name] , Thank you for taking time to come for your Medicare Wellness Visit. I appreciate your ongoing commitment to your health goals. Please review the following plan we discussed and let me know if I can assist you in the future.   These are the goals we discussed:    Restart metformin 500mg  take 2 tablets daily with food Also restart citalopram 20mg  take 1 tablet once a day    . Increase physical activity     Goal is 150 minutes per week    . Reduce sugar intake       Increase non-starchy vegetables - carrots, green bean, squash, zucchini, tomatoes, onions, peppers, spinach and other green leafy vegetables, cabbage, lettuce, cucumbers, asparagus, okra (not fried), eggplant limit sugar and processed foods (cakes, cookies, ice cream, crackers and chips) Increase fresh fruit but limit serving sizes 1/2 cup or about the size of tennis or baseball limit red meat to no more than 1-2 times per week (serving size about the size of your palm) Choose whole grains / lean proteins - whole wheat bread, quinoa, whole grain rice (1/2 cup), fish, chicken, [place]        This is a list of the screening recommended for you and due dates:  Health Maintenance  Topic Date Due  . Flu Shot  04/24/2016  .  Hepatitis C: One time screening is recommended by Center for Disease Control  (CDC) for  adults born from [birth year] through 1967.   Done today  . Shingles Vaccine  Reverify cost January 2018  . Hemoglobin A1C  08/30/2016  . Eye exam for diabetics  11/28/2016  . Stool Blood Test  01/12/2017  . DEXA scan (bone density measurement)  03/24/2017  . Complete foot exam   04/17/2017  . Mammogram  07/24/2017  . Tetanus Vaccine  11/21/2020  . Colon Cancer Screening  01/09/2022  . Pneumonia vaccines  Completed  *Topic was postponed. The date shown is not the original due date.   Fall Prevention in the Home  Falls can cause injuries and can affect people from all age groups. There are many simple things that  you can do to make your home safe and to help prevent falls. WHAT CAN I DO ON THE OUTSIDE OF MY HOME?  Regularly repair the edges of walkways and driveways and fix any cracks.  Remove high doorway thresholds.  Trim any shrubbery on the main path into your home.  Use bright outdoor lighting.  Clear walkways of debris and clutter, including tools and rocks.  Regularly check that handrails are securely fastened and in good repair. Both sides of any steps should have handrails.  Install guardrails along the edges of any raised decks or porches.  Have leaves, snow, and ice cleared regularly.  Use sand or salt on walkways during winter months.  In the garage, clean up any spills right away, including grease or oil spills. WHAT CAN I DO IN THE BATHROOM?  Use night lights.  Install grab bars by the toilet and in the tub and shower. Do not use towel bars as grab bars.  Use non-skid mats or decals on the floor of the tub or shower.  If you need to sit down while you are in the shower, use a plastic, non-slip stool.Marland Kitchen  Keep the floor dry. Immediately clean up any water that spills on the floor.  Remove soap buildup in the tub or shower on a regular basis.  Attach bath mats securely with  double-sided non-slip rug tape.  Remove throw rugs and other tripping hazards from the floor. WHAT CAN I DO IN THE BEDROOM?  Use night lights.  Make sure that a bedside light is easy to reach.  Do not use oversized bedding that drapes onto the floor.  Have a firm chair that has side arms to use for getting dressed.  Remove throw rugs and other tripping hazards from the floor. WHAT CAN I DO IN THE KITCHEN?   Clean up any spills right away.  Avoid walking on wet floors.  Place frequently used items in easy-to-reach places.  If you need to reach for something above you, use a sturdy step stool that has a grab bar.  Keep electrical cables out of the way.  Do not use floor polish or wax  that makes floors slippery. If you have to use wax, make sure that it is non-skid floor wax.  Remove throw rugs and other tripping hazards from the floor. WHAT CAN I DO IN THE STAIRWAYS?  Do not leave any items on the stairs.  Make sure that there are handrails on both sides of the stairs. Fix handrails that are broken or loose. Make sure that handrails are as long as the stairways.  Check any carpeting to make sure that it is firmly attached to the stairs. Fix any carpet that is loose or worn.  Avoid having throw rugs at the top or bottom of stairways, or secure the rugs with carpet tape to prevent them from moving.  Make sure that you have a light switch at the top of the stairs and the bottom of the stairs. If you do not have them, have them installed. WHAT ARE SOME OTHER FALL PREVENTION TIPS?  Wear closed-toe shoes that fit well and support your feet. Wear shoes that have rubber soles or low heels.  When you use a stepladder, make sure that it is completely opened and that the sides are firmly locked. Have someone hold the ladder while you are using it. Do not climb a closed stepladder.  Add color or contrast paint or tape to grab bars and handrails in your home. Place contrasting color strips on the first and last steps.  Use mobility aids as needed, such as canes, walkers, scooters, and crutches.  Turn on lights if it is dark. Replace any light bulbs that burn out.  Set up furniture so that there are clear paths. Keep the furniture in the same spot.  Fix any uneven floor surfaces.  Choose a carpet design that does not hide the edge of steps of a stairway.  Be aware of any and all pets.  Review your medicines with your healthcare provider. Some medicines can cause dizziness or changes in blood pressure, which increase your risk of falling. Talk with your health care provider about other ways that you can decrease your risk of falls. This may include working with a physical  therapist or trainer to improve your strength, balance, and endurance.   This information is not intended to replace advice given to you by your health care provider. Make sure you discuss any questions you have with your health care provider.   Document Released: 08/31/2002 Document Revised: 01/25/2015 Document Reviewed: 10/15/2014 Elsevier Interactive Patient Education [employer].

## 2016-06-21 NOTE — Progress Notes (Signed)
Patient ID: [name], female   DOB: November 30, 1950, 65 y.o.   MRN: [id number]     Subjective:   [name] is a 65 y.o. female who presents for a subsequent Medicare Annual Wellness Visit.  Mrs. Borgeson is widowed.  He husband Tim died 12-02-2015.  She lives alone but her daughter and her family are moving in this weekend temporarily. She is active in her church.  Mrs. Joss's biggest health concern is the cost of her medication.  She is in [facility] Coverage Gap.  We have tried to get assistance through [facility] PAP.  This program will help patient get meds from drug companies when in coverage gap.  Currently however the patient doesn't qualify for assistance as she has to spend $1000 total out of pocket drug cost for entire year and she is currently at about $370.  We have been supplying samples for insulin as much as possible.   In reviewing meds she notes that she has not been taking metformin or citalopram.  She just forgot about taking citalopram and though that she might have stopped metformin because it was expensive.  I called [facility] and they quoted 90 day supply cost of $9.99 for citalopram and $4.90 for metformin.   Current Medications (verified) Outpatient Encounter Prescriptions as of 06/21/2016  Medication Sig  . ALPRAZolam (XANAX) 0.5 MG tablet Take 1 tablet (0.5 mg total) by mouth 3 (three) times daily as needed for anxiety or sleep.  Marland Kitchen aspirin EC 325 MG tablet Take 325 mg by mouth daily.  Marland Kitchen atenolol (TENORMIN) 50 MG tablet Take 1 tablet (50 mg total) by mouth 2 (two) times daily.  . ciprofloxacin (CIPRO) 500 MG tablet Take 1 tablet (500 mg total) by mouth 2 (two) times daily.  . clopidogrel (PLAVIX) 75 MG tablet Take 1 tablet (75 mg total) by mouth daily.  . furosemide (LASIX) 40 MG tablet Take 1 tablet (40 mg total) by mouth daily.  . insulin aspart (NOVOLOG FLEXPEN) 100 UNIT/ML FlexPen INJECT 35 UNITS SQ 3 TIMES DAILY  . Insulin Degludec (TRESIBA FLEXTOUCH) 100  UNIT/ML SOPN Inject 70 Units into the skin daily.  . Insulin Lispro (HUMALOG KWIKPEN) 200 UNIT/ML SOPN Inject 35 Units into the skin 3 (three) times daily as needed.  . isosorbide mononitrate (IMDUR) 120 MG 24 hr tablet Take 1 tablet (120 mg total) by mouth daily.  Marland Kitchen lisinopril (PRINIVIL,ZESTRIL) 40 MG tablet Take 1 tablet (40 mg total) by mouth daily.  . metroNIDAZOLE (FLAGYL) 500 MG tablet Take 1 tablet (500 mg total) by mouth 2 (two) times daily.  . nitroGLYCERIN (NITROSTAT) 0.4 MG SL tablet DISSOLVE 1 TAB UNDER TOUNGE FOR CHEST PAIN. MAY REPEAT EVERY 5 MINUTES FOR 3 DOSES. IF NO RELIEF CALL 911 OR GO TO ER  . potassium chloride SA (K-DUR,KLOR-CON) 20 MEQ tablet Take 1 tablet (20 mEq total) by mouth 2 (two) times daily.  . rosuvastatin (CRESTOR) 40 MG tablet Take 1 tablet (40 mg total) by mouth daily.  Marland Kitchen zolpidem (AMBIEN) 10 MG tablet TAKE 1/2 TO 1 TABLET AT BEDTIME AS NEEDED  . citalopram (CELEXA) 20 MG tablet Take 1 tablet (20 mg total) by mouth daily. (Patient not taking: Reported on 06/21/2016)  . metFORMIN (GLUCOPHAGE-XR) 500 MG 24 hr tablet Take 2 tablets (1,000 mg total) by mouth daily with breakfast. (Patient not taking: Reported on 06/21/2016)  . vitamin B-12 (CYANOCOBALAMIN) 1000 MCG tablet Take 1,000 mcg by mouth daily.  . Vitamin D, Ergocalciferol, (DRISDOL)  50000 units CAPS capsule Take 1 capsule (50,000 Units total) by mouth every 7 (seven) days. (Patient not taking: Reported on 06/21/2016)   No facility-administered encounter medications on file as of 06/21/2016.     Allergies (verified) Iohexol; Ticlid [ticlopidine hcl]; and Codeine   History: Past Medical History:  Diagnosis Date  . Anginal pain ([place])   . Anxiety   . CAD (coronary artery disease)    DES to circumflex 02/2007, BMS to LAD and PTCA diagonal 03/2007  . Carotid artery plaque    Mild  . Cataract   . CHF (congestive heart failure) ([place])   . Depression   . Diverticulitis, colon   . Elevated d-dimer 01/08/2014    . Essential hypertension, benign   . GERD (gastroesophageal reflux disease)   . H/O hiatal hernia   . HLD (hyperlipidemia)   . IDDM (insulin dependent diabetes mellitus) ([place])   . Migraine    "used to have them really bad; don't have them anymore" (01/07/2014)  . MS (multiple sclerosis) ([place])    Not confirmed  . NSTEMI (non-ST elevated myocardial infarction) ([place])    02/2007  . PAT (paroxysmal atrial tachycardia) ([place])   . Pneumonia 1980's   "once"  . Prolapse of uterus   . PVD (peripheral vascular disease) ([place])   . TIA (transient ischemic attack) 1980's   Past Surgical History:  Procedure Laterality Date  . ABDOMINAL HYSTERECTOMY  1986   ovaries remain - prolaspe uterus   . APPENDECTOMY  ~ 1970  . BREAST BIOPSY Right 1980's  . BREAST LUMPECTOMY Right 1980's   Dr. Charlynne Pander   . CARDIAC CATHETERIZATION  01/07/2014  . CHOLECYSTECTOMY  ?1987  . COLONOSCOPY  2002   Dr. Anwar--> Severe diverticular changes in the region of the sigmoid and descending colon with scattered diverticular changes throughout the rest of the colon. No polyps, ulcerations. Despite numerous manipulations, the tip of the scope could not be tipped into the cecal area.  . COLONOSCOPY  01/10/2012   Procedure: COLONOSCOPY;  Surgeon: Daneil Dolin, MD;  Location: [facility];  Service: Endoscopy;  Laterality: N/A;  1:55  . CORONARY ANGIOPLASTY WITH STENT PLACEMENT  ~ 1997 X 2   "2 + 1"  . EYE SURGERY Bilateral 2014   cataract  . LEFT HEART CATHETERIZATION WITH CORONARY ANGIOGRAM N/A 01/07/2014   Procedure: LEFT HEART CATHETERIZATION WITH CORONARY ANGIOGRAM;  Surgeon: Larey Dresser, MD;  Location: [facility] CATH LAB;  Service: Cardiovascular;  Laterality: N/A;   Family History  Problem Relation Age of Onset  . Heart attack Mother 70  . Diabetes Mother   . Hypertension Mother   . Heart attack Father 92  . Heart attack Brother 32    x 6  . Colon cancer Paternal Aunt     73s, died with brain anuerysm  . Crohn's  disease Cousin     paternal   Social History   Occupational History  . Disability   .  Disabled   Social History Main Topics  . Smoking status: Never Smoker  . Smokeless tobacco: Never Used     Comment: spouse, 72 years - husband has quit 01/2011  . Alcohol use No  . Drug use: No  . Sexual activity: No    Do you feel safe at home?  Yes Are there smokers in your home (other than you)? Yes  Dietary issues and exercise activities: Current Exercise Habits: Home exercise routine, Type of exercise: walking, Time (Minutes): 30, Frequency (  Times/Week): 4, Weekly Exercise (Minutes/Week): 120, Intensity: Moderate  Current Dietary habits:  Doesn't cook a lot because its just her but this is going to change with daughter's family moving in.  Eats 2-3 meals per day.  Snacks occasionally. Eats sweets occasionally - usually at church events.  Objective:    Today's Vitals   06/21/16 1426  BP: 122/70  Pulse: 60  Weight: 182 lb 8 oz (82.8 kg)  Height: 5' 6"  (1.676 m)  PainSc: 2    A1c - 8.7% today (has increased from 8.1%)  Body mass index is 29.46 kg/m.  Activities of Daily Living In your present state of health, do you have any difficulty performing the following activities: 06/21/2016  Hearing? N  Vision? N  Difficulty concentrating or making decisions? N  Walking or climbing stairs? N  Dressing or bathing? N  Doing errands, shopping? N  Preparing Food and eating ? N  Using the Toilet? N  In the past six months, have you accidently leaked urine? N  Do you have problems with loss of bowel control? N  Managing your Medications? N  Managing your Finances? N  Housekeeping or managing your Housekeeping? N  Some recent data might be hidden     Cardiac Risk Factors include: advanced age (>35mn, >>75women);diabetes mellitus;dyslipidemia;family history of premature cardiovascular disease;hypertension  Depression Screen PHQ 2/9 Scores 06/21/2016 06/07/2016 06/07/2016 04/17/2016    PHQ - 2 Score 1 0 0 1  PHQ- 9 Score - - - -     Fall Risk Fall Risk  06/21/2016 06/07/2016 06/07/2016 04/17/2016 04/06/2015  Falls in the past year? Yes No No Yes No  Number falls in past yr: 2 or more - - 1 -  Injury with Fall? No - - No -  Risk Factor Category  High Fall Risk - - - -  Risk for fall due to : History of fall(s);Medication side effect - - - -  Follow up Falls prevention discussed - - - -    Cognitive Function: MMSE - Mini Mental State Exam 06/21/2016 04/06/2015  Orientation to time 5 5  Orientation to Place 5 5  Registration 3 3  Attention/ Calculation 5 5  Recall 3 3  Language- name 2 objects 2 2  Language- repeat 1 1  Language- follow 3 step command 3 3  Language- read & follow direction 1 1  Write a sentence 1 1  Copy design 0 1  Total score 29 30    Immunizations and Health Maintenance Immunization History  Administered Date(s) Administered  . Influenza,inj,Quad PF,36+ Mos 06/28/2015  . Influenza-Unspecified 05/25/2014, 06/15/2016  . Pneumococcal Conjugate-13 02/08/2014  . Pneumococcal Polysaccharide-23 06/28/2015  . Tdap 11/22/2010   Health Maintenance Due  Topic Date Due  . INFLUENZA VACCINE  04/24/2016    Patient Care Team: DChipper Herb MD as PCP - General (Family Medicine) RDaneil Dolin MD (Gastroenterology) JMinus Breeding MD (Cardiology)  Indicate any recent Medical Services you may have received from other than [facility] providers in the past year (date may be approximate).    Assessment:    Annual Wellness Visit  Uncontrolled type 2 DM Medication compliance - complicated by Medicare Coverage Gap   Screening Tests Health Maintenance  Topic Date Due  . INFLUENZA VACCINE  04/24/2016  . Hepatitis C Screening  11/18/2016 (Originally 302/13/1952  . ZOSTAVAX  12/16/2016 (Originally 11/22/2008)  . HEMOGLOBIN A1C  08/30/2016  . OPHTHALMOLOGY EXAM  11/28/2016  . COLON CANCER SCREENING ANNUAL FOBT  01/12/2017  . DEXA SCAN  03/24/2017  . FOOT  EXAM  04/17/2017  . MAMMOGRAM  07/24/2017  . TETANUS/TDAP  11/21/2020  . COLONOSCOPY  01/09/2022  . PNA vac Low Risk Adult  Completed        Plan:   During the course of the visit Lylianna was educated and counseled about the following appropriate screening and preventive services:   Vaccines to include Pneumoccal, Influenza, Td, Zostavax - only vaccine due if Zostavax.  Zostavax postpone due to cost - will reverify in 2018  Colorectal cancer screening - FOBT and colonosocpy are both UTD  Cardiovascular disease screening - EKG UTD; lipids at goal and pt taking statin.  BP at goal today  Diabetes - restart metformin.  Continue Tyler Aas and Novolog at current dose - gave #1 sample of each today  Restart citalopram  Bone Denisty / Osteoporosis Screening - DEXA is UTD  Fall prevention discussed.  Mammogram - UTD  PAP - UTD  Glaucoma screening / Diabetic Eye Exam - UTD  Nutrition counseling - discussed increasing fruit and vegetables, lean proteins and whole grains.  Limit sodium and sugar intake  Advanced Directives - declined info  Physical Activity - increase to 150 minutes weekly  Orders Placed This Encounter  Procedures  . Hepatitis C antibody  . VITAMIN D 25 Hydroxy (Vit-D Deficiency, Fractures)  . BMP8+EGFR  . Bayer DCA Hb A1c Waived      Patient Instructions (the written plan) were given to the patient.   Cherre Robins, PharmD   06/21/2016

## 2016-06-22 LAB — BMP8+EGFR
BUN / CREAT RATIO: 14 (ref 12–28)
BUN: 10 mg/dL (ref 8–27)
CALCIUM: 9.7 mg/dL (ref 8.7–10.3)
CHLORIDE: 95 mmol/L — AB (ref 96–106)
CO2: 35 mmol/L — ABNORMAL HIGH (ref 18–29)
Creatinine, Ser: 0.74 mg/dL (ref 0.57–1.00)
GFR calc non Af Amer: 84 mL/min/{1.73_m2} (ref >59)
GFR, EST AFRICAN AMERICAN: 97 mL/min/{1.73_m2} (ref >59)
GLUCOSE: 187 mg/dL — AB (ref 65–99)
POTASSIUM: 3.1 mmol/L — AB (ref 3.5–5.2)
SODIUM: 142 mmol/L (ref 134–144)

## 2016-06-22 LAB — VITAMIN D 25 HYDROXY (VIT D DEFICIENCY, FRACTURES): VIT D 25 HYDROXY: 34.2 ng/mL (ref 30.0–100.0)

## 2016-06-22 LAB — HEPATITIS C ANTIBODY: Hep C Virus Ab: <0.1 s/co ratio (ref 0.0–0.9)

## 2016-06-22 NOTE — Addendum Note (Signed)
Addended by: Cherre Robins on: 06/22/2016 02:18 PM   Modules accepted: Orders

## 2016-06-29 ENCOUNTER — Encounter: Payer: Self-pay | Admitting: Family Medicine

## 2016-06-29 ENCOUNTER — Ambulatory Visit (INDEPENDENT_AMBULATORY_CARE_PROVIDER_SITE_OTHER): Payer: Commercial Managed Care - HMO | Admitting: Family Medicine

## 2016-06-29 VITALS — BP 129/62 | HR 88 | Temp 97.3°F | Ht 66.0 in | Wt 181.2 lb

## 2016-06-29 DIAGNOSIS — L089 Local infection of the skin and subcutaneous tissue, unspecified: Secondary | ICD-10-CM | POA: Diagnosis not present

## 2016-06-29 MED ORDER — CEPHALEXIN 500 MG PO CAPS: 500.0000 mg | ORAL_CAPSULE | Freq: Four times a day (QID) | ORAL | 0 refills | Status: DC

## 2016-06-29 NOTE — Progress Notes (Signed)
   HPI  Patient presents today with concern for cellulitis.  Patient states that for several weeks she's had persistent pain on the top of her shoulder and extending on the under part of her arm. Its warm, red on the top of her shoulder, and tender to the touch. 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  She had mild nausea one day ago. She has had some shoulder joint pain and mild numbness of the right hand has been coming and going without loss of strength for only one day.  She denies fevers, chills, sweats. She's tolerating food and fluids normally  PMH: Smoking status noted ROS: Per HPI  Objective: BP 129/62   Pulse 88   Temp 97.3 F (36.3 C) (Oral)   Ht 5\' 6"  (1.676 m)   Wt 181 lb 3.2 oz (82.2 kg)   BMI 29.25 kg/m  Gen: NAD, alert, cooperative with exam HEENT: NCAT CV: RRR, good S1/S2, no murmur Resp: CTABL, no wheezes, non-labored Ext: No edema, warm Neuro: Alert and oriented, No gross deficits  Skin:  Slightly warm to the touch on the right superior part of her shoulder, and on the medial portion of her upper arm, where the tenderness to palpation is the most severe. Slight erythema over the posterior and superior shoulder  MSK Her shoulder range of motion is reasonably well preserved. No neck pain with palpation or tenderness to palpation of the shoulder bony muscular landmark marks   Assessment and plan:  # Skin infection Unclear etiology of pain in warmth, however even her concern for cellulitis although ahead and cover with Keflex She has uncontrolled diabetes She does not have any signs of severe radiculopathy, however she does have the intermittent but mild hand numbness today.- This is the first day of symptoms for that.  Low threshold for returning, return in 1 week if symptoms are not improving. Recommended probiotics while she's taking antibiotics.   Meds ordered this encounter  Medications  . cephALEXin  (KEFLEX) 500 MG capsule    Sig: Take 1 capsule (500 mg total) by mouth 4 (four) times daily.    Dispense:  28 capsule    Refill:  0    Laroy Apple, MD [facility] 06/29/2016, 9:17 AM

## 2016-06-29 NOTE — Patient Instructions (Signed)
Great to meet you!  Take all antibiotics, take pro-biotics over the counter twice daily while you are on the antibiotics  Come back next week if you are not improving as expected   Cellulitis Cellulitis is an infection of the skin and the tissue beneath it. The infected area is usually red and tender. Cellulitis occurs most often in the arms and lower legs.  CAUSES  Cellulitis is caused by bacteria that enter the skin through cracks or cuts in the skin. The most common types of bacteria that cause cellulitis are staphylococci and streptococci. SIGNS AND SYMPTOMS   Redness and warmth.  Swelling.  Tenderness or pain.  Fever. DIAGNOSIS  Your health care provider can usually determine what is wrong based on a physical exam. Blood tests may also be done. TREATMENT  Treatment usually involves taking an antibiotic medicine. HOME CARE INSTRUCTIONS   Take your antibiotic medicine as directed by your health care provider. Finish the antibiotic even if you start to feel better.  Keep the infected arm or leg elevated to reduce swelling.  Apply a warm cloth to the affected area up to 4 times per day to relieve pain.  Take medicines only as directed by your health care provider.  Keep all follow-up visits as directed by your health care provider. SEEK MEDICAL CARE IF:   You notice red streaks coming from the infected area.  Your red area gets larger or turns dark in color.  Your bone or joint underneath the infected area becomes painful after the skin has healed.  Your infection returns in the same area or another area.  You notice a swollen bump in the infected area.  You develop new symptoms.  You have a fever. SEEK IMMEDIATE MEDICAL CARE IF:   You feel very sleepy.  You develop vomiting or diarrhea.  You have a general ill feeling (malaise) with muscle aches and pains.   This information is not intended to replace advice given to you by your health care provider. Make  sure you discuss any questions you have with your health care provider.   Document Released: 06/20/2005 Document Revised: 06/01/2015 Document Reviewed: 11/26/2011 Elsevier Interactive Patient Education [employer].

## 2016-07-02 ENCOUNTER — Telehealth: Payer: Self-pay | Admitting: Family Medicine

## 2016-07-02 NOTE — Telephone Encounter (Signed)
Left message, please call to schedule an appointment to have brusied arm assessed.

## 2016-07-03 ENCOUNTER — Encounter: Payer: Self-pay | Admitting: Family Medicine

## 2016-07-03 ENCOUNTER — Ambulatory Visit (INDEPENDENT_AMBULATORY_CARE_PROVIDER_SITE_OTHER): Payer: Commercial Managed Care - HMO

## 2016-07-03 ENCOUNTER — Ambulatory Visit (INDEPENDENT_AMBULATORY_CARE_PROVIDER_SITE_OTHER): Payer: Commercial Managed Care - HMO | Admitting: Family Medicine

## 2016-07-03 VITALS — BP 130/64 | HR 74 | Temp 97.4°F | Ht 66.0 in | Wt 181.0 lb

## 2016-07-03 DIAGNOSIS — S40021A Contusion of right upper arm, initial encounter: Secondary | ICD-10-CM

## 2016-07-03 NOTE — Progress Notes (Signed)
   HPI  Patient presents today here with persistent right arm pain.  She was seen on 06/29/2016, 4 days ago, for right arm redness and pain, she was concerned about cellulitis at the time. She's had no improvement with Keflex.  One day ago she awoke with bruising of the right lower upper arm just above the elbow, she has discrete pain and the underside of her arm in the right shoulder with extension down to the right elbow.  He still has intermittent right hand numbness. However no radicular pattern pain.  No neck pain. No discrete injury No fever, chills, sweats. No improvement with Keflex.  Patient denies any history of clot, she takes aspirin and Plavix  PMH: Smoking status noted ROS: Per HPI  Objective: BP 130/64   Pulse 74   Temp 97.4 F (36.3 C) (Oral)   Ht '5\' 6"'$  (1.676 m)   Wt 181 lb (82.1 kg)   BMI 29.21 kg/m  Gen: NAD, alert, cooperative with exam HEENT: NCAT CV: RRR, good S1/S2, no murmur Resp: CTABL, no wheezes, non-labored Ext: No edema, warm Neuro: Alert and oriented, No gross deficits  MSK/Skin  bruising just above the right elbow extending to the mid upper arm on the extensor surface Mild tenderness to palpation, no erythema, warmth, or fluctuance. Negative Hawkins and Neer sign, no tenderness to palpation of the shoulder No axillary lymphadenopathy 2+ radial pulses bilaterally Normal sensation grossly in the right hand   Assessment and plan:  # Right arm bruising Unusual presentation with 4-6 weeks of pain and now bruising. Clearly this is not infectious, okay to stop Keflex X-rays, CBC and CMP. Consider CT I think clot is very unlikely given aspirin, Plavix, and no characteristic findings of DVT of the upper arm. Low threshold for return     Orders Placed This Encounter  Procedures  . DG Shoulder Right    Standing Status:   Future    Standing Expiration Date:   09/02/2017    Order Specific Question:   Reason for Exam (SYMPTOM  OR  DIAGNOSIS REQUIRED)    Answer:   R shoulder and elbow pain with bruising without trauma    Order Specific Question:   Preferred imaging location?    Answer:   External  . DG Elbow 2 Views Right    Standing Status:   Future    Standing Expiration Date:   07/03/2017    Order Specific Question:   Reason for Exam (SYMPTOM  OR DIAGNOSIS REQUIRED)    Answer:   R shoulder and elbow pain with bruising without trauma    Order Specific Question:   Preferred imaging location?    Answer:   External  . CBC with Differential  . CMP14+EGFR    No orders of the defined types were placed in this encounter.   Laroy Apple, MD [facility] 07/03/2016, 8:15 AM

## 2016-07-03 NOTE — Patient Instructions (Signed)
Great to see you !  Please come back if your symptoms worsen or change, I am glad to continue looking into the source of your pain and brusing.

## 2016-07-04 ENCOUNTER — Other Ambulatory Visit: Payer: Self-pay | Admitting: Family Medicine

## 2016-07-04 ENCOUNTER — Telehealth: Payer: Self-pay | Admitting: Family Medicine

## 2016-07-04 DIAGNOSIS — M7989 Other specified soft tissue disorders: Secondary | ICD-10-CM | POA: Insufficient documentation

## 2016-07-04 DIAGNOSIS — S40021S Contusion of right upper arm, sequela: Secondary | ICD-10-CM

## 2016-07-04 LAB — CBC WITH DIFFERENTIAL/PLATELET
BASOS ABS: 0 10*3/uL (ref 0.0–0.2)
BASOS: 0 %
EOS (ABSOLUTE): 0.2 10*3/uL (ref 0.0–0.4)
EOS: 2 %
HEMATOCRIT: 36.5 % (ref 34.0–46.6)
HEMOGLOBIN: 12.1 g/dL (ref 11.1–15.9)
IMMATURE GRANS (ABS): 0 10*3/uL (ref 0.0–0.1)
Immature Granulocytes: 0 %
LYMPHS ABS: 2.9 10*3/uL (ref 0.7–3.1)
LYMPHS: 33 %
MCH: 27.9 pg (ref 26.6–33.0)
MCHC: 33.2 g/dL (ref 31.5–35.7)
MCV: 84 fL (ref 79–97)
MONOCYTES: 5 %
Monocytes Absolute: 0.4 10*3/uL (ref 0.1–0.9)
NEUTROS ABS: 5.2 10*3/uL (ref 1.4–7.0)
Neutrophils: 60 %
Platelets: 217 10*3/uL (ref 150–379)
RBC: 4.34 x10E6/uL (ref 3.77–5.28)
RDW: 15 % (ref 12.3–15.4)
WBC: 8.6 10*3/uL (ref 3.4–10.8)

## 2016-07-04 LAB — CMP14+EGFR
ALBUMIN: 4.1 g/dL (ref 3.6–4.8)
ALK PHOS: 46 IU/L (ref 39–117)
ALT: 15 IU/L (ref 0–32)
AST: 22 IU/L (ref 0–40)
Albumin/Globulin Ratio: 1.5 (ref 1.2–2.2)
BUN / CREAT RATIO: 11 — AB (ref 12–28)
BUN: 10 mg/dL (ref 8–27)
Bilirubin Total: 0.5 mg/dL (ref 0.0–1.2)
CALCIUM: 9.3 mg/dL (ref 8.7–10.3)
CO2: 30 mmol/L — AB (ref 18–29)
CREATININE: 0.92 mg/dL (ref 0.57–1.00)
Chloride: 95 mmol/L — ABNORMAL LOW (ref 96–106)
GFR calc Af Amer: 75 mL/min/{1.73_m2} (ref >59)
GFR, EST NON AFRICAN AMERICAN: 65 mL/min/{1.73_m2} (ref >59)
GLOBULIN, TOTAL: 2.8 g/dL (ref 1.5–4.5)
GLUCOSE: 232 mg/dL — AB (ref 65–99)
Potassium: 3.4 mmol/L — ABNORMAL LOW (ref 3.5–5.2)
SODIUM: 142 mmol/L (ref 134–144)
TOTAL PROTEIN: 6.9 g/dL (ref 6.0–8.5)

## 2016-07-04 MED ORDER — INSULIN DEGLUDEC 100 UNIT/ML ~~LOC~~ SOPN: 70.0000 [IU] | PEN_INJECTOR | Freq: Every day | SUBCUTANEOUS | 0 refills | Status: DC

## 2016-07-04 NOTE — Telephone Encounter (Signed)
Left #2 Tyler Aas for patient

## 2016-07-05 ENCOUNTER — Ambulatory Visit (HOSPITAL_COMMUNITY)
Admission: RE | Admit: 2016-07-05 | Discharge: 2016-07-05 | Disposition: A | Discharge status: Home / Self Care | Payer: Commercial Managed Care - HMO | Source: Ambulatory Visit | Attending: Family Medicine | Admitting: Family Medicine

## 2016-07-05 DIAGNOSIS — M7989 Other specified soft tissue disorders: Secondary | ICD-10-CM | POA: Diagnosis present

## 2016-07-05 DIAGNOSIS — X58XXXS Exposure to other specified factors, sequela: Secondary | ICD-10-CM | POA: Insufficient documentation

## 2016-07-05 DIAGNOSIS — S40021S Contusion of right upper arm, sequela: Secondary | ICD-10-CM | POA: Insufficient documentation

## 2016-07-06 NOTE — Progress Notes (Signed)
Patient aware.

## 2016-07-10 NOTE — Telephone Encounter (Signed)
Patient has been seen since phone call 

## 2016-07-16 ENCOUNTER — Telehealth: Payer: Self-pay | Admitting: Family Medicine

## 2016-07-16 MED ORDER — INSULIN DEGLUDEC 200 UNIT/ML ~~LOC~~ SOPN: 70.0000 [IU] | PEN_INJECTOR | Freq: Every day | SUBCUTANEOUS | 0 refills | Status: DC

## 2016-07-16 NOTE — Telephone Encounter (Signed)
Patient was given #1 Tresiba u200 pen.  We also discussed switching to NPH insulin or Relion N insulin which she could get 1 vial for $25 at [facility] but dose wuld change to 35 units q12h..  Patient to consider this option

## 2016-07-26 ENCOUNTER — Telehealth: Payer: Self-pay | Admitting: Family Medicine

## 2016-07-26 NOTE — Telephone Encounter (Signed)
Please has patient bring in paperwork and we can fax to health dept / Becky Sax for her.

## 2016-07-26 NOTE — Telephone Encounter (Signed)
Patient aware.

## 2016-08-06 ENCOUNTER — Telehealth: Payer: Self-pay | Admitting: Pharmacist

## 2016-08-10 ENCOUNTER — Other Ambulatory Visit: Payer: Self-pay | Admitting: *Deleted

## 2016-08-10 MED ORDER — ALPRAZOLAM 0.5 MG PO TABS: 0.5000 mg | ORAL_TABLET | Freq: Three times a day (TID) | ORAL | 1 refills | Status: DC | PRN

## 2016-08-23 ENCOUNTER — Encounter: Payer: Self-pay | Admitting: Family Medicine

## 2016-08-23 ENCOUNTER — Ambulatory Visit (INDEPENDENT_AMBULATORY_CARE_PROVIDER_SITE_OTHER): Payer: Commercial Managed Care - HMO | Admitting: Family Medicine

## 2016-08-23 VITALS — BP 168/83 | HR 89 | Temp 97.3°F | Ht 66.0 in | Wt 175.4 lb

## 2016-08-23 DIAGNOSIS — I739 Peripheral vascular disease, unspecified: Secondary | ICD-10-CM

## 2016-08-23 DIAGNOSIS — R7989 Other specified abnormal findings of blood chemistry: Secondary | ICD-10-CM | POA: Diagnosis not present

## 2016-08-23 DIAGNOSIS — I10 Essential (primary) hypertension: Secondary | ICD-10-CM | POA: Diagnosis not present

## 2016-08-23 DIAGNOSIS — I200 Unstable angina: Secondary | ICD-10-CM

## 2016-08-23 DIAGNOSIS — E1169 Type 2 diabetes mellitus with other specified complication: Secondary | ICD-10-CM

## 2016-08-23 DIAGNOSIS — I779 Disorder of arteries and arterioles, unspecified: Secondary | ICD-10-CM

## 2016-08-23 DIAGNOSIS — E1165 Type 2 diabetes mellitus with hyperglycemia: Secondary | ICD-10-CM | POA: Diagnosis not present

## 2016-08-23 DIAGNOSIS — E785 Hyperlipidemia, unspecified: Secondary | ICD-10-CM | POA: Diagnosis not present

## 2016-08-23 DIAGNOSIS — Z794 Long term (current) use of insulin: Secondary | ICD-10-CM | POA: Diagnosis not present

## 2016-08-23 LAB — LIPID PANEL
CHOL/HDL RATIO: 3.4 ratio (ref 0.0–4.4)
Cholesterol, Total: 96 mg/dL — ABNORMAL LOW (ref 100–199)
HDL: 28 mg/dL — AB (ref >39)
LDL Calculated: 46 mg/dL (ref 0–99)
Triglycerides: 110 mg/dL (ref 0–149)
VLDL Cholesterol Cal: 22 mg/dL (ref 5–40)

## 2016-08-23 NOTE — Addendum Note (Signed)
Addended by: Zannie Cove on: 08/23/2016 09:25 AM   Modules accepted: Orders

## 2016-08-23 NOTE — Progress Notes (Signed)
Subjective:    Patient ID: [name], female    DOB: 14-Oct-1950, 65 y.o.   MRN: [id number]  HPI  Pt is here today for a follow up of chronic medical conditions which include diabetes, hypertension, hyperlipidemia and depression due to grief from loss of husband.The patient is pleasant and alert and somewhat tearful because she still is and will be dealing with the loss of her husband especially during this time the year. She is very tight for children and her grandchildren. She has not seen the cardiologist regularly. Her A1c most recently was 0.7%. I emphasized the importance of getting the blood sugars under control and this is with diet and exercise and she understands that. She denies any chest pain anymore than usual and denies any shortness of breath both during the day and at night. She has no problems with her stomach. Her bowels are moving regularly. She has no blood in the stool or black tarry bowel movements. She has no abdominal pain. She is passing her water without problems. She is past due on her mammogram and her visit with the cardiologist. She says she can't afford ago because she is in the doughnut hole. She also needs to follow-up with the clinical pharmacist on her elevated hemoglobin A1c. We will make sure that she has appointments in January with the cardiologist the clinical pharmacists and to get her mammogram.    Patient Active Problem List   Diagnosis Date Noted  . Swelling of upper arm 07/04/2016  . Superficial bruising of arm, right, sequela 07/04/2016  . B12 deficiency 01/13/2016  . Facial droop 09/01/2015  . Stroke ([place]) 09/01/2015  . TIA (transient ischemic attack) 08/14/2014  . DM type 2 with diabetic dyslipidemia ([place]) 08/14/2014  . Cataract 02/08/2014  . Macular degeneration 02/08/2014  . Elevated d-dimer, neg VQ scan 01/08/2014  . Unstable angina ([place]) 01/07/2014  . Chest pain 12/30/2013  . Generalized anxiety disorder 09/14/2013  . Claudication ([place])  06/18/2013  . Diverticulosis of colon 01/01/2012  . Left lower quadrant pain 01/01/2012  . GERD (gastroesophageal reflux disease) 01/01/2012  . Constipation 01/01/2012  . Hypokalemia 08/24/2011  . Uncontrolled type 2 diabetes mellitus with insulin therapy ([place]) 08/23/2011  . Essential hypertension, benign 08/23/2011  . PALPITATIONS 02/10/2010  . Hyperlipidemia 05/10/2009  . DEPRESSION 05/10/2009  . CAD, NATIVE VESSEL, cath 01/07/14 non obstructive coronary disease 08/26/2008  . Carotid artery disease ([place]) 08/26/2008   Outpatient Encounter Prescriptions as of 08/23/2016  Medication Sig  . ALPRAZolam (XANAX) 0.5 MG tablet Take 1 tablet (0.5 mg total) by mouth 3 (three) times daily as needed for anxiety or sleep.  Marland Kitchen aspirin EC 325 MG tablet Take 325 mg by mouth daily.  Marland Kitchen atenolol (TENORMIN) 50 MG tablet Take 1 tablet (50 mg total) by mouth 2 (two) times daily.  . citalopram (CELEXA) 20 MG tablet Take 1 tablet (20 mg total) by mouth daily.  . clopidogrel (PLAVIX) 75 MG tablet Take 1 tablet (75 mg total) by mouth daily.  . furosemide (LASIX) 40 MG tablet Take 1 tablet (40 mg total) by mouth daily.  . insulin aspart (NOVOLOG FLEXPEN) 100 UNIT/ML FlexPen INJECT 35 UNITS SQ 3 TIMES DAILY  . Insulin Degludec (TRESIBA FLEXTOUCH) 200 UNIT/ML SOPN Inject 70 Units into the skin daily.  . Insulin Lispro (HUMALOG KWIKPEN) 200 UNIT/ML SOPN Inject 35 Units into the skin 3 (three) times daily as needed.  . isosorbide mononitrate (IMDUR) 120 MG 24 hr tablet Take 1 tablet (  120 mg total) by mouth daily.  Marland Kitchen lisinopril (PRINIVIL,ZESTRIL) 40 MG tablet Take 1 tablet (40 mg total) by mouth daily.  . metFORMIN (GLUCOPHAGE-XR) 500 MG 24 hr tablet Take 2 tablets (1,000 mg total) by mouth daily with breakfast.  . nitroGLYCERIN (NITROSTAT) 0.4 MG SL tablet DISSOLVE 1 TAB UNDER TOUNGE FOR CHEST PAIN. MAY REPEAT EVERY 5 MINUTES FOR 3 DOSES. IF NO RELIEF CALL 911 OR GO TO ER  . potassium chloride SA (K-DUR,KLOR-CON) 20  MEQ tablet Take 1 tablet (20 mEq total) by mouth 2 (two) times daily.  . rosuvastatin (CRESTOR) 40 MG tablet Take 1 tablet (40 mg total) by mouth daily.  . vitamin B-12 (CYANOCOBALAMIN) 1000 MCG tablet Take 1,000 mcg by mouth daily.  . Vitamin D, Ergocalciferol, (DRISDOL) 50000 units CAPS capsule Take 1 capsule (50,000 Units total) by mouth every 7 (seven) days.  Marland Kitchen zolpidem (AMBIEN) 10 MG tablet TAKE 1/2 TO 1 TABLET AT BEDTIME AS NEEDED  . [DISCONTINUED] cephALEXin (KEFLEX) 500 MG capsule Take 1 capsule (500 mg total) by mouth 4 (four) times daily.   No facility-administered encounter medications on file as of 08/23/2016.         Review of Systems  Constitutional: Positive for fatigue.  Respiratory: Negative.   Genitourinary: Negative.   Psychiatric/Behavioral: Positive for sleep disturbance.       Objective:   Physical Exam  Constitutional: She is oriented to person, place, and time. She appears well-developed and well-nourished. She appears distressed.  The patient is pleasant and alert but somewhat tearful because of the season of the year and she is now living that without her husband.  HENT:  Head: Normocephalic and atraumatic.  Right Ear: External ear normal.  Left Ear: External ear normal.  Mouth/Throat: Oropharynx is clear and moist.  Slight nasal congestion right greater than left  Eyes: Conjunctivae and EOM are normal. Pupils are equal, round, and reactive to light. Right eye exhibits no discharge. Left eye exhibits no discharge. No scleral icterus.  Neck: Normal range of motion. Neck supple. No thyromegaly present.  No bruits thyromegaly or anterior cervical adenopathy  Cardiovascular: Normal rate, regular rhythm and normal heart sounds.   No murmur heard. Distal pulses were difficult to palpate on either side. She does have some claudication with walking at times. I encouraged her to do more walking.  Pulmonary/Chest: Effort normal and breath sounds normal. No  respiratory distress. She has no wheezes. She has no rales.  Clear anteriorly and posteriorly  Abdominal: Soft. Bowel sounds are normal. She exhibits no mass. There is no tenderness. There is no rebound and no guarding.  No abdominal tenderness masses or organ enlargement or bruits  Musculoskeletal: Normal range of motion. She exhibits no edema.  Lymphadenopathy:    She has no cervical adenopathy.  Neurological: She is alert and oriented to person, place, and time. She has normal reflexes. No cranial nerve deficit.  Skin: Skin is warm and dry. No rash noted.  The skin is somewhat dry in general.  Psychiatric: She has a normal mood and affect. Her behavior is normal. Judgment and thought content normal.  Nursing note and vitals reviewed.  BP (!) 168/83 (BP Location: Left Arm, Patient Position: Sitting, Cuff Size: Normal)   Pulse 89   Temp 97.3 F (36.3 C) (Oral)   Ht 5\' 6"  (1.676 m)   Wt 175 lb 6.4 oz (79.6 kg)   SpO2 99%   BMI 28.31 kg/m   Repeat blood pressure was 140/75.  Assessment & Plan:  1. Low serum vitamin D - get blood work done and regulate vitamin D her results  2. Essential hypertension, benign -The patient is stressed today and her blood pressure is elevated.  3. Type 2 diabetes mellitus with hyperglycemia, with long-term current use of insulin ([facility]) -The most recent hemoglobin A1c was 8.7% and we will make sure that she has a visit scheduled with clinical pharmacy to get better control with her blood sugar.  4. Unstable angina ([facility]) -Continue to follow-up with cardiology. We will make an appointment for her to see the cardiologist as soon as possible  5. DM type 2 with diabetic dyslipidemia ([place]) -Continue to walk and exercise regularly and monitor blood sugars closely and return to the office for a visit with clinical pharmacy with blood sugar readings to make sure there were getting the best control possible.  6. Carotid artery disease, unspecified  laterality ([place]) -Continue with carotid Dopplers. No bruits were audible today.  No orders of the defined types were placed in this encounter.  Patient Instructions  Continue current medications. Continue good therapeutic lifestyle changes which include good diet and exercise. Fall precautions discussed with patient. If an FOBT was given today- please return it to our front desk. If you are over 75 years old - you may need Prevnar 26 or the adult Pneumonia vaccine.  **Flu shots are available--- please call and schedule a FLU-CLINIC appointment**  After your visit with [name] today you will receive a survey in the mail or online from [employer] regarding your care with [name]. Please take a moment to fill this out. Your feedback is very important to [name] as you can help [name] better understand your patient needs as well as improve your experience and satisfaction. WE CARE ABOUT YOU!!!  It is very important that the patient scheduled for mammogram. We will her to follow-up regularly also with the clinical pharmacist to get the blood sugar under better control. Her last hemoglobin A1c was 8.7%. Please schedule her for a visit in early January with clinical pharmacy to bring in blood sugars from the outside for review with the pharmacist. Continue to try to walk regularly as this will improve the circulation in your lower extremities We will also arrange for you to see the cardiologist as your visit with him is apparently due to.  Arrie Senate MD

## 2016-08-23 NOTE — Addendum Note (Signed)
Addended by: Zannie Cove on: 08/23/2016 12:41 PM   Modules accepted: Orders

## 2016-08-23 NOTE — Patient Instructions (Addendum)
Continue current medications. Continue good therapeutic lifestyle changes which include good diet and exercise. Fall precautions discussed with patient. If an FOBT was given today- please return it to our front desk. If you are over 67 years old - you may need Prevnar 74 or the adult Pneumonia vaccine.  **Flu shots are available--- please call and schedule a FLU-CLINIC appointment**  After your visit with [name] today you will receive a survey in the mail or online from [employer] regarding your care with [name]. Please take a moment to fill this out. Your feedback is very important to [name] as you can help [name] better understand your patient needs as well as improve your experience and satisfaction. WE CARE ABOUT YOU!!!  It is very important that the patient scheduled for mammogram. We will her to follow-up regularly also with the clinical pharmacist to get the blood sugar under better control. Her last hemoglobin A1c was 8.7%. Please schedule her for a visit in early January with clinical pharmacy to bring in blood sugars from the outside for review with the pharmacist. Continue to try to walk regularly as this will improve the circulation in your lower extremities We will also arrange for you to see the cardiologist as your visit with him is apparently due to.

## 2016-08-26 LAB — BMP8+EGFR
BUN / CREAT RATIO: 8 — AB (ref 12–28)
BUN: 8 mg/dL (ref 8–27)
CHLORIDE: 90 mmol/L — AB (ref 96–106)
CO2: 31 mmol/L — ABNORMAL HIGH (ref 18–29)
CREATININE: 1.04 mg/dL — AB (ref 0.57–1.00)
Calcium: 9.3 mg/dL (ref 8.7–10.3)
GFR, EST AFRICAN AMERICAN: 64 mL/min/{1.73_m2} (ref >59)
GFR, EST NON AFRICAN AMERICAN: 56 mL/min/{1.73_m2} — AB (ref >59)
Glucose: 223 mg/dL — ABNORMAL HIGH (ref 65–99)
Potassium: 2.8 mmol/L — ABNORMAL LOW (ref 3.5–5.2)
Sodium: 141 mmol/L (ref 134–144)

## 2016-08-26 LAB — SPECIMEN STATUS REPORT

## 2016-08-30 ENCOUNTER — Telehealth: Payer: Self-pay | Admitting: Family Medicine

## 2016-08-30 ENCOUNTER — Other Ambulatory Visit: Payer: Commercial Managed Care - HMO

## 2016-08-30 DIAGNOSIS — R799 Abnormal finding of blood chemistry, unspecified: Secondary | ICD-10-CM

## 2016-08-30 NOTE — Telephone Encounter (Signed)
Patient has been taking Relion 70/30 mix because she is in medicare coverage gap and did not qualify for assistance from drug company.  She has been taking 35 units 3-4 times a day.  I had samples of Tresiba and Novolog #2 pens of each . Patient is to stop Relion 70/30 and restart Tresiba 70 units qd and Novolog 35 units prior to each meal.  Continue to check BG 3-4 times a day and call if not improving.

## 2016-08-31 LAB — BMP8+EGFR
BUN/Creatinine Ratio: 9 — ABNORMAL LOW (ref 12–28)
BUN: 10 mg/dL (ref 8–27)
CALCIUM: 9.3 mg/dL (ref 8.7–10.3)
CO2: 28 mmol/L (ref 18–29)
CREATININE: 1.08 mg/dL — AB (ref 0.57–1.00)
Chloride: 102 mmol/L (ref 96–106)
GFR calc Af Amer: 61 mL/min/{1.73_m2} (ref >59)
GFR, EST NON AFRICAN AMERICAN: 53 mL/min/{1.73_m2} — AB (ref >59)
Glucose: 173 mg/dL — ABNORMAL HIGH (ref 65–99)
Potassium: 3.8 mmol/L (ref 3.5–5.2)
Sodium: 144 mmol/L (ref 134–144)

## 2016-09-03 ENCOUNTER — Other Ambulatory Visit: Payer: Self-pay | Admitting: *Deleted

## 2016-09-03 DIAGNOSIS — E876 Hypokalemia: Secondary | ICD-10-CM

## 2016-09-18 ENCOUNTER — Other Ambulatory Visit: Payer: Self-pay | Admitting: Family Medicine

## 2016-09-25 ENCOUNTER — Other Ambulatory Visit: Payer: Self-pay | Admitting: Family Medicine

## 2016-09-25 NOTE — Telephone Encounter (Signed)
Last rx filled today any refills will be put on file.

## 2016-09-29 ENCOUNTER — Emergency Department (HOSPITAL_COMMUNITY): Payer: PPO

## 2016-09-29 ENCOUNTER — Encounter (HOSPITAL_COMMUNITY): Payer: Self-pay | Admitting: *Deleted

## 2016-09-29 ENCOUNTER — Emergency Department (HOSPITAL_COMMUNITY)
Admission: EM | Admit: 2016-09-29 | Discharge: 2016-09-29 | Disposition: A | Discharge status: Home / Self Care | Payer: PPO | Attending: Emergency Medicine | Admitting: Emergency Medicine

## 2016-09-29 DIAGNOSIS — Z7982 Long term (current) use of aspirin: Secondary | ICD-10-CM | POA: Insufficient documentation

## 2016-09-29 DIAGNOSIS — I509 Heart failure, unspecified: Secondary | ICD-10-CM | POA: Insufficient documentation

## 2016-09-29 DIAGNOSIS — Z79899 Other long term (current) drug therapy: Secondary | ICD-10-CM | POA: Insufficient documentation

## 2016-09-29 DIAGNOSIS — I110 Hypertensive heart disease with heart failure: Secondary | ICD-10-CM | POA: Insufficient documentation

## 2016-09-29 DIAGNOSIS — E860 Dehydration: Secondary | ICD-10-CM | POA: Diagnosis not present

## 2016-09-29 DIAGNOSIS — Z794 Long term (current) use of insulin: Secondary | ICD-10-CM | POA: Diagnosis not present

## 2016-09-29 DIAGNOSIS — R51 Headache: Secondary | ICD-10-CM | POA: Diagnosis not present

## 2016-09-29 DIAGNOSIS — I2510 Atherosclerotic heart disease of native coronary artery without angina pectoris: Secondary | ICD-10-CM | POA: Insufficient documentation

## 2016-09-29 DIAGNOSIS — E119 Type 2 diabetes mellitus without complications: Secondary | ICD-10-CM | POA: Insufficient documentation

## 2016-09-29 DIAGNOSIS — R55 Syncope and collapse: Secondary | ICD-10-CM | POA: Diagnosis not present

## 2016-09-29 DIAGNOSIS — R42 Dizziness and giddiness: Secondary | ICD-10-CM

## 2016-09-29 LAB — URINALYSIS, ROUTINE W REFLEX MICROSCOPIC
BILIRUBIN URINE: NEGATIVE
Bacteria, UA: NONE SEEN
Glucose, UA: >=500 mg/dL — AB
Ketones, ur: NEGATIVE mg/dL
LEUKOCYTES UA: NEGATIVE
Nitrite: NEGATIVE
PROTEIN: 30 mg/dL — AB
Specific Gravity, Urine: 1.006 (ref 1.005–1.030)
pH: 6 (ref 5.0–8.0)

## 2016-09-29 LAB — CBC WITH DIFFERENTIAL/PLATELET
BASOS ABS: 0 10*3/uL (ref 0.0–0.1)
BASOS PCT: 0 %
EOS ABS: 0.2 10*3/uL (ref 0.0–0.7)
EOS PCT: 2 %
HCT: 42.3 % (ref 36.0–46.0)
HEMOGLOBIN: 14.5 g/dL (ref 12.0–15.0)
LYMPHS ABS: 2.5 10*3/uL (ref 0.7–4.0)
Lymphocytes Relative: 30 %
MCH: 29.7 pg (ref 26.0–34.0)
MCHC: 34.3 g/dL (ref 30.0–36.0)
MCV: 86.5 fL (ref 78.0–100.0)
Monocytes Absolute: 0.4 10*3/uL (ref 0.1–1.0)
Monocytes Relative: 4 %
NEUTROS PCT: 64 %
Neutro Abs: 5.4 10*3/uL (ref 1.7–7.7)
PLATELETS: 174 10*3/uL (ref 150–400)
RBC: 4.89 MIL/uL (ref 3.87–5.11)
RDW: 14.1 % (ref 11.5–15.5)
WBC: 8.4 10*3/uL (ref 4.0–10.5)

## 2016-09-29 LAB — BASIC METABOLIC PANEL
ANION GAP: 10 (ref 5–15)
BUN: 13 mg/dL (ref 6–20)
CHLORIDE: 97 mmol/L — AB (ref 101–111)
CO2: 29 mmol/L (ref 22–32)
Calcium: 9.7 mg/dL (ref 8.9–10.3)
Creatinine, Ser: 1.14 mg/dL — ABNORMAL HIGH (ref 0.44–1.00)
GFR, EST AFRICAN AMERICAN: 56 mL/min — AB (ref >60)
GFR, EST NON AFRICAN AMERICAN: 49 mL/min — AB (ref >60)
Glucose, Bld: 315 mg/dL — ABNORMAL HIGH (ref 65–99)
POTASSIUM: 3.3 mmol/L — AB (ref 3.5–5.1)
SODIUM: 136 mmol/L (ref 135–145)

## 2016-09-29 LAB — CBG MONITORING, ED: Glucose-Capillary: 123 mg/dL — ABNORMAL HIGH (ref 65–99)

## 2016-09-29 LAB — TROPONIN I

## 2016-09-29 MED ORDER — HYDROGEN PEROXIDE 3 % EX SOLN
CUTANEOUS | Status: AC
  Filled 2016-09-29: qty 473

## 2016-09-29 MED ORDER — SODIUM CHLORIDE 0.9 % IV BOLUS (SEPSIS)
500.0000 mL | Freq: Once | INTRAVENOUS | Status: AC
  Administered 2016-09-29: 500 mL via INTRAVENOUS

## 2016-09-29 MED ORDER — MECLIZINE HCL 25 MG PO TABS: 25.0000 mg | ORAL_TABLET | Freq: Three times a day (TID) | ORAL | 0 refills | Status: DC | PRN

## 2016-09-29 MED ORDER — POTASSIUM CHLORIDE CRYS ER 20 MEQ PO TBCR
40.0000 meq | EXTENDED_RELEASE_TABLET | Freq: Once | ORAL | Status: AC
  Administered 2016-09-29: 40 meq via ORAL
  Filled 2016-09-29: qty 2

## 2016-09-29 NOTE — ED Notes (Signed)
Report given to [employer]

## 2016-09-29 NOTE — ED Provider Notes (Signed)
[facility] DEPT Provider Note   CSN: [id number] Arrival date & time: 09/29/16  1148   By signing my name below, I, Collene Leyden, attest that this documentation has been prepared under the direction and in the presence of Julianne Rice, MD. Electronically Signed: Collene Leyden, Scribe. 09/29/16. 12:53 PM.  History   Chief Complaint Chief Complaint  Patient presents with  . Loss of Consciousness    HPI Comments: [name] is a 66 y.o. female with a PMHX of CAD, CHF, HTN, PAT, and TIA, who presents to the Emergency Department complaining of sudden-onset and episodic dizziness. Patient reports falling twice in the past 4 days. She states she gets dizzy (room spinning) and falls. She has left-sided weakness from previous stroke a year ago which is unchanged. Dizziness is worsened by turning head. She denies any ear pain or rigidity. She denies any loss of consciousness, nausea, vomiting, chest pain, or shortness of breath. States she hit her head when she fell this morning. Denies neck pain or headache. No visual changes.  HPI  Past Medical History:  Diagnosis Date  . Anginal pain ([place])   . Anxiety   . CAD (coronary artery disease)    DES to circumflex 02/2007, BMS to LAD and PTCA diagonal 03/2007  . Carotid artery plaque    Mild  . Cataract   . CHF (congestive heart failure) ([place])   . Depression   . Diverticulitis, colon   . Elevated d-dimer 01/08/2014  . Essential hypertension, benign   . GERD (gastroesophageal reflux disease)   . H/O hiatal hernia   . HLD (hyperlipidemia)   . IDDM (insulin dependent diabetes mellitus) ([place])   . Migraine    "used to have them really bad; don't have them anymore" (01/07/2014)  . MS (multiple sclerosis) ([place])    Not confirmed  . NSTEMI (non-ST elevated myocardial infarction) ([place])    02/2007  . PAT (paroxysmal atrial tachycardia) ([place])   . Pneumonia 1980's   "once"  . Prolapse of uterus   . PVD (peripheral vascular disease) ([place])   .  TIA (transient ischemic attack) 1980's    Patient Active Problem List   Diagnosis Date Noted  . Swelling of upper arm 07/04/2016  . Superficial bruising of arm, right, sequela 07/04/2016  . B12 deficiency 01/13/2016  . Facial droop 09/01/2015  . Stroke ([place]) 09/01/2015  . TIA (transient ischemic attack) 08/14/2014  . DM type 2 with diabetic dyslipidemia ([place]) 08/14/2014  . Cataract 02/08/2014  . Macular degeneration 02/08/2014  . Elevated d-dimer, neg VQ scan 01/08/2014  . Unstable angina ([place]) 01/07/2014  . Chest pain 12/30/2013  . Generalized anxiety disorder 09/14/2013  . Claudication ([place]) 06/18/2013  . Diverticulosis of colon 01/01/2012  . Left lower quadrant pain 01/01/2012  . GERD (gastroesophageal reflux disease) 01/01/2012  . Constipation 01/01/2012  . Hypokalemia 08/24/2011  . Uncontrolled type 2 diabetes mellitus with insulin therapy ([place]) 08/23/2011  . Essential hypertension, benign 08/23/2011  . PALPITATIONS 02/10/2010  . Hyperlipidemia 05/10/2009  . DEPRESSION 05/10/2009  . CAD, NATIVE VESSEL, cath 01/07/14 non obstructive coronary disease 08/26/2008  . Carotid artery disease ([place]) 08/26/2008    Past Surgical History:  Procedure Laterality Date  . ABDOMINAL HYSTERECTOMY  1986   ovaries remain - prolaspe uterus   . APPENDECTOMY  ~ 1970  . BREAST BIOPSY Right 1980's  . BREAST LUMPECTOMY Right 1980's   Dr. Charlynne Pander   . CARDIAC CATHETERIZATION  01/07/2014  . CHOLECYSTECTOMY  ?1987  .  COLONOSCOPY  2002   Dr. Anwar--> Severe diverticular changes in the region of the sigmoid and descending colon with scattered diverticular changes throughout the rest of the colon. No polyps, ulcerations. Despite numerous manipulations, the tip of the scope could not be tipped into the cecal area.  . COLONOSCOPY  01/10/2012   Procedure: COLONOSCOPY;  Surgeon: Daneil Dolin, MD;  Location: [facility];  Service: Endoscopy;  Laterality: N/A;  1:55  . CORONARY ANGIOPLASTY WITH STENT  PLACEMENT  ~ 1997 X 2   "2 + 1"  . EYE SURGERY Bilateral 2014   cataract  . LEFT HEART CATHETERIZATION WITH CORONARY ANGIOGRAM N/A 01/07/2014   Procedure: LEFT HEART CATHETERIZATION WITH CORONARY ANGIOGRAM;  Surgeon: Larey Dresser, MD;  Location: [facility] CATH LAB;  Service: Cardiovascular;  Laterality: N/A;    OB History    No data available       Home Medications    Prior to Admission medications   Medication Sig Start Date End Date Taking? Authorizing Provider  ALPRAZolam Duanne Moron) 0.5 MG tablet take 1  Tablet by mouth 3 times daily AS NEEDED FOR ANXIETY OR SLEEP 09/25/16  Yes Chipper Herb, MD  aspirin EC 325 MG tablet Take 325 mg by mouth daily.   Yes Historical Provider, MD  atenolol (TENORMIN) 50 MG tablet Take 1 tablet (50 mg total) by mouth 2 (two) times daily. 06/07/16  Yes Chipper Herb, MD  citalopram (CELEXA) 20 MG tablet Take 1 tablet (20 mg total) by mouth daily. 06/07/16  Yes Chipper Herb, MD  clopidogrel (PLAVIX) 75 MG tablet Take 1 tablet (75 mg total) by mouth daily. 06/07/16  Yes Chipper Herb, MD  furosemide (LASIX) 40 MG tablet Take 1 tablet (40 mg total) by mouth daily. 06/07/16  Yes Chipper Herb, MD  insulin aspart (NOVOLOG FLEXPEN) 100 UNIT/ML FlexPen INJECT 35 UNITS SQ 3 TIMES DAILY Patient taking differently: Inject 45 Units into the skin 3 (three) times daily with meals. INJECT 35 UNITS SQ 3 TIMES DAILY 06/07/16  Yes Chipper Herb, MD  isosorbide mononitrate (IMDUR) 120 MG 24 hr tablet Take 1 tablet (120 mg total) by mouth daily. 06/07/16  Yes Chipper Herb, MD  lisinopril (PRINIVIL,ZESTRIL) 40 MG tablet Take 1 tablet (40 mg total) by mouth daily. 06/07/16  Yes Chipper Herb, MD  metFORMIN (GLUCOPHAGE-XR) 500 MG 24 hr tablet Take 2 Tablets by mouth once daily with BREAKFAST 09/18/16  Yes Chipper Herb, MD  nitroGLYCERIN (NITROSTAT) 0.4 MG SL tablet DISSOLVE 1 TAB UNDER TOUNGE FOR CHEST PAIN. MAY REPEAT EVERY 5 MINUTES FOR 3 DOSES. IF NO RELIEF CALL 911 OR GO TO ER  06/07/16  Yes Chipper Herb, MD  potassium chloride SA (K-DUR,KLOR-CON) 20 MEQ tablet Take 1 tablet (20 mEq total) by mouth 2 (two) times daily. Patient taking differently: Take 40 mEq by mouth. In the morning and 20 meq in thw evening. 06/07/16  Yes Chipper Herb, MD  rosuvastatin (CRESTOR) 40 MG tablet Take 1 tablet (40 mg total) by mouth daily. 06/07/16  Yes Chipper Herb, MD  vitamin B-12 (CYANOCOBALAMIN) 1000 MCG tablet Take 1,000 mcg by mouth daily.   Yes Historical Provider, MD  Vitamin D, Ergocalciferol, (DRISDOL) 50000 units CAPS capsule TAKE ONE CAPSULE BY MOUTH EVERY SEVEN DAYS 09/25/16  Yes Chipper Herb, MD  zolpidem (AMBIEN) 10 MG tablet TAKE 1/2 TO 1 TABLET AT BEDTIME AS NEEDED 06/07/16  Yes Chipper Herb, MD  Insulin Degludec (TRESIBA FLEXTOUCH) 200 UNIT/ML SOPN Inject 70 Units into the skin daily. Patient not taking: Reported on 09/29/2016 07/16/16   Cherre Robins, PharmD  Insulin Lispro (HUMALOG KWIKPEN) 200 UNIT/ML SOPN Inject 35 Units into the skin 3 (three) times daily as needed. Patient not taking: Reported on 09/29/2016 03/05/16   Cherre Robins, PharmD  meclizine (ANTIVERT) 25 MG tablet Take 1 tablet (25 mg total) by mouth 3 (three) times daily as needed for dizziness. 09/29/16   Julianne Rice, MD    Family History Family History  Problem Relation Age of Onset  . Heart attack Mother 37  . Diabetes Mother   . Hypertension Mother   . Heart attack Father 20  . Heart attack Brother 32    x 6  . Colon cancer Paternal Aunt     65s, died with brain anuerysm  . Crohn's disease Cousin     paternal    Social History Social History  Substance Use Topics  . Smoking status: Never Smoker  . Smokeless tobacco: Never Used     Comment: spouse, 63 years - husband has quit 01/2011  . Alcohol use No     Allergies   Iohexol; Ticlid [ticlopidine hcl]; and Codeine   Review of Systems Review of Systems  Constitutional: Negative for activity change, appetite change, chills, fatigue  and fever.  HENT: Negative for congestion, ear discharge, ear pain, hearing loss, rhinorrhea, sinus pain, sinus pressure and sore throat.   Eyes: Negative for visual disturbance.  Respiratory: Negative for cough, chest tightness and shortness of breath.   Cardiovascular: Negative for chest pain, palpitations and leg swelling.  Gastrointestinal: Negative for abdominal pain, constipation, diarrhea, nausea and vomiting.  Genitourinary: Negative for difficulty urinating, dysuria, flank pain, frequency and hematuria.  Musculoskeletal: Positive for gait problem and myalgias. Negative for arthralgias, back pain, joint swelling, neck pain and neck stiffness.  Skin: Negative for rash and wound.  Neurological: Positive for dizziness. Negative for syncope, facial asymmetry, speech difficulty, weakness, numbness and headaches.  Hematological: Bruises/bleeds easily.  All other systems reviewed and are negative.    Physical Exam Updated Vital Signs BP (!) 126/47   Pulse 73   Temp 98 F (36.7 C) (Oral)   Resp 18   Ht 5\' 6"  (1.676 m)   Wt 176 lb (79.8 kg)   SpO2 99%   BMI 28.41 kg/m   Physical Exam  Constitutional: She is oriented to person, place, and time. She appears well-developed and well-nourished. No distress.  HENT:  Head: Normocephalic and atraumatic.  Mouth/Throat: Oropharynx is clear and moist. No oropharyngeal exudate.  No obvious head injury. Patient has fatigable rotary nystagmus. Patient has a hair laying against her TM on the right side.  Eyes: EOM are normal. Pupils are equal, round, and reactive to light.  Neck: Normal range of motion. Neck supple.  No midline cervical tenderness to palpation. Patient does have some mild tenderness to percussion of the left trapezius. No obvious deformity  Cardiovascular: Normal rate and regular rhythm.  Exam reveals no gallop and no friction rub.   No murmur heard. Pulmonary/Chest: Effort normal and breath sounds normal. No respiratory  distress. She has no wheezes. She has no rales. She exhibits no tenderness.  Abdominal: Soft. Bowel sounds are normal. There is no tenderness. There is no rebound and no guarding.  Musculoskeletal: Normal range of motion. She exhibits no edema or tenderness.  No midline thoracic or lumbar tenderness. No lower extremity swelling, asymmetry or pain.  Lymphadenopathy:    She has no cervical adenopathy.  Neurological: She is alert and oriented to person, place, and time.  Skin: Skin is warm and dry. Capillary refill takes less than 2 seconds. No rash noted. No erythema.  Psychiatric: She has a normal mood and affect. Her behavior is normal.  Nursing note and vitals reviewed.    ED Treatments / Results  DIAGNOSTIC STUDIES: Oxygen Saturation is 97% on RA, normal by my interpretation.    COORDINATION OF CARE: 12:52 PM Discussed treatment plan with pt at bedside and pt agreed to plan.  Labs (all labs ordered are listed, but only abnormal results are displayed) Labs Reviewed  BASIC METABOLIC PANEL - Abnormal; Notable for the following:       Result Value   Potassium 3.3 (*)    Chloride 97 (*)    Glucose, Bld 315 (*)    Creatinine, Ser 1.14 (*)    GFR calc non Af Amer 49 (*)    GFR calc Af Amer 56 (*)    All other components within normal limits  URINALYSIS, ROUTINE W REFLEX MICROSCOPIC - Abnormal; Notable for the following:    Glucose, UA >=500 (*)    Hgb urine dipstick SMALL (*)    Protein, ur 30 (*)    All other components within normal limits  CBG MONITORING, ED - Abnormal; Notable for the following:    Glucose-Capillary 123 (*)    All other components within normal limits  CBC WITH DIFFERENTIAL/PLATELET  TROPONIN I    EKG  EKG Interpretation  Date/Time:  Saturday September 29 2016 12:16:43 EST Ventricular Rate:  92 PR Interval:    QRS Duration: 91 QT Interval:  389 QTC Calculation: 482 R Axis:   -47 Text Interpretation:  Sinus rhythm Inferior infarct, old Consider  anterior infarct Confirmed by Vennie Salsbury  MD, Kierstan Auer ([id number]) on 09/29/2016 2:26:02 PM       Radiology Ct Head Wo Contrast  Result Date: 09/29/2016 CLINICAL DATA:  Recent fall with headaches and dizziness, initial encounter EXAM: CT HEAD WITHOUT CONTRAST TECHNIQUE: Contiguous axial images were obtained from the base of the skull through the vertex without intravenous contrast. COMPARISON:  None. FINDINGS: Brain: No evidence of acute infarction, hemorrhage, hydrocephalus, extra-axial collection or mass lesion/mass effect. Vascular: No hyperdense vessel or unexpected calcification. Skull: Normal. Negative for fracture or focal lesion. Sinuses/Orbits: No acute finding. Other: None IMPRESSION: No acute intracranial abnormality noted. Electronically Signed   By: Inez Catalina M.D.   On: 09/29/2016 13:50    Procedures Procedures (including critical care time)  Medications Ordered in ED Medications  sodium chloride 0.9 % bolus 500 mL (0 mLs Intravenous Stopped 09/29/16 1409)  sodium chloride 0.9 % bolus 500 mL (0 mLs Intravenous Stopped 09/29/16 1655)  potassium chloride SA (K-DUR,KLOR-CON) CR tablet 40 mEq (40 mEq Oral Given 09/29/16 1414)     Initial Impression / Assessment and Plan / ED Course  I have reviewed the triage vital signs and the nursing notes.  Pertinent labs & imaging results that were available during my care of the patient were reviewed by me and considered in my medical decision making (see chart for details).  Clinical Course    CT head without acute abnormality. Ambulating without difficulty. No new neurologic symptoms. Dizziness likely due to dehydration and there may be a peripheral vertiginous component. Return precautions given.   Final Clinical Impressions(s) / ED Diagnoses   Final diagnoses:  Dizziness  Dehydration    New Prescriptions Discharge  Medication List as of 09/29/2016  5:24 PM    START taking these medications   Details  meclizine (ANTIVERT) 25 MG tablet  Take 1 tablet (25 mg total) by mouth 3 (three) times daily as needed for dizziness., Starting Sat 09/29/2016, Print       I personally performed the services described in this documentation, which was scribed in my presence. The recorded information has been reviewed and is accurate.       Julianne Rice, MD 09/30/16 2021

## 2016-09-29 NOTE — ED Notes (Signed)
Pt made aware to return if symptoms worsen or if any life threatening symptoms occur.   

## 2016-09-29 NOTE — ED Triage Notes (Signed)
Pt c/o feeling weak and dizzy and then falling over the last 2 weeks. Pt reports left sided weakness that has been going on since her last TIA in Dec. 2016, no new unilateral weakness. Pt reports she hit her head upon falling against the couch. Pt reports she always falls down on the left side. Hx of TIA and hypokalemia.

## 2016-09-29 NOTE — ED Notes (Signed)
Right ear flushed and eyelash expelled from ear. MD notified.

## 2016-09-29 NOTE — ED Notes (Signed)
Ambulated pt to bathroom without difficulty. MD notified.

## 2016-09-30 ENCOUNTER — Other Ambulatory Visit: Payer: Self-pay

## 2016-10-10 ENCOUNTER — Ambulatory Visit: Payer: Self-pay | Admitting: Pharmacist

## 2016-10-30 ENCOUNTER — Encounter: Payer: Self-pay | Admitting: Cardiology

## 2016-10-30 NOTE — Progress Notes (Signed)
Cardiology Office Note   Date:  10/31/2016   ID:  [name], DOB 04-11-1951, MRN [id number]  PCP:  Redge Gainer, MD  Cardiologist:   Minus Breeding, MD    No chief complaint on file.     History of Present Illness: [name] is a 66 y.o. female who presents for evaluation of chest pain.   She has seen Dr. Domenic Polite in the past but has not been seen by [name] since April 2015.  She has a known history of stents to the LAD and PTCA to diagonal in 2008.    She had non obstructive CAD in 2015.  She was admitted with slurred speech in 2016 and had a negative work up with an unremarkable echo and mild carotid plaque.  She was in the ED in January with dehydration     She does get chest discomfort. This happens sporadically. Intermittent pressure or burning discomfort and she points to her upper epigastric area as well as mid chest. She does not describe associated shortness of breath, PND or orthopnea. She does not have palpitations, presyncope or syncope. She does her household chores.  She now lives alone as her husband died with cancer last 2022/12/06.   Past Medical History:  Diagnosis Date  . Anxiety   . CAD (coronary artery disease)    DES to circumflex 02/2007, BMS to LAD and PTCA diagonal 03/2007  . Carotid artery plaque    Mild  . Cataract   . Depression   . Diverticulitis, colon   . Elevated d-dimer 01/08/2014  . Essential hypertension, benign   . GERD (gastroesophageal reflux disease)   . H/O hiatal hernia   . HLD (hyperlipidemia)   . IDDM (insulin dependent diabetes mellitus) ([place])   . Migraine    "used to have them really bad; don't have them anymore" (01/07/2014)  . MS (multiple sclerosis) ([place])    Not confirmed  . PAT (paroxysmal atrial tachycardia) ([place])   . Prolapse of uterus   . PVD (peripheral vascular disease) ([place])   . TIA (transient ischemic attack) 1980's    Past Surgical History:  Procedure Laterality Date  . ABDOMINAL HYSTERECTOMY  1986   ovaries remain -  prolaspe uterus   . APPENDECTOMY  ~ 1970  . BREAST BIOPSY Right 1980's  . BREAST LUMPECTOMY Right 1980's   Dr. Charlynne Pander   . CARDIAC CATHETERIZATION  01/07/2014  . CHOLECYSTECTOMY  ?1987  . COLONOSCOPY  2002   Dr. Anwar--> Severe diverticular changes in the region of the sigmoid and descending colon with scattered diverticular changes throughout the rest of the colon. No polyps, ulcerations. Despite numerous manipulations, the tip of the scope could not be tipped into the cecal area.  . COLONOSCOPY  01/10/2012   Procedure: COLONOSCOPY;  Surgeon: Daneil Dolin, MD;  [facility]: [facility];  Service: Endoscopy;  Laterality: N/A;  1:55  . CORONARY ANGIOPLASTY WITH STENT PLACEMENT  ~ 1997 X 2   "2 + 1"  . EYE SURGERY Bilateral 2014   cataract  . LEFT HEART CATHETERIZATION WITH CORONARY ANGIOGRAM N/A 01/07/2014   Procedure: LEFT HEART CATHETERIZATION WITH CORONARY ANGIOGRAM;  Surgeon: Larey Dresser, MD;  Location: [facility] CATH LAB;  Service: Cardiovascular;  Laterality: N/A;     Current Outpatient Prescriptions  Medication Sig Dispense Refill  . ALPRAZolam (XANAX) 0.5 MG tablet take 1  Tablet by mouth 3 times daily AS NEEDED FOR ANXIETY OR SLEEP 90 tablet 0  .  aspirin EC 325 MG tablet Take 325 mg by mouth daily.    Marland Kitchen atenolol (TENORMIN) 50 MG tablet Take 1 tablet (50 mg total) by mouth 2 (two) times daily. 180 tablet 2  . citalopram (CELEXA) 20 MG tablet Take 1 tablet (20 mg total) by mouth daily. 90 tablet 2  . clopidogrel (PLAVIX) 75 MG tablet Take 1 tablet (75 mg total) by mouth daily. 90 tablet 3  . furosemide (LASIX) 40 MG tablet Take 1 tablet (40 mg total) by mouth daily. (Patient taking differently: Take 40 mg by mouth daily. Take one daily and one additional tablet if needed) 90 tablet 2  . insulin aspart (NOVOLOG FLEXPEN) 100 UNIT/ML FlexPen INJECT 35 UNITS SQ 3 TIMES DAILY (Patient taking differently: Inject 45 Units into the skin 3 (three) times daily with meals. INJECT 35 UNITS SQ 3  TIMES DAILY) 15 mL 11  . Insulin Degludec (TRESIBA FLEXTOUCH) 200 UNIT/ML SOPN Inject 70 Units into the skin daily. 3 mL 0  . Insulin Lispro (HUMALOG KWIKPEN) 200 UNIT/ML SOPN Inject 35 Units into the skin 3 (three) times daily as needed. 3 mL 0  . isosorbide mononitrate (IMDUR) 120 MG 24 hr tablet Take 1 tablet (120 mg total) by mouth daily. 90 tablet 1  . lisinopril (PRINIVIL,ZESTRIL) 40 MG tablet Take 1 tablet (40 mg total) by mouth daily. 90 tablet 1  . meclizine (ANTIVERT) 25 MG tablet Take 1 tablet (25 mg total) by mouth 3 (three) times daily as needed for dizziness. 30 tablet 0  . metFORMIN (GLUCOPHAGE-XR) 500 MG 24 hr tablet Take 2 Tablets by mouth once daily with BREAKFAST 180 tablet 0  . nitroGLYCERIN (NITROSTAT) 0.4 MG SL tablet DISSOLVE 1 TAB UNDER TOUNGE FOR CHEST PAIN. MAY REPEAT EVERY 5 MINUTES FOR 3 DOSES. IF NO RELIEF CALL 911 OR GO TO ER 25 tablet 6  . potassium chloride SA (K-DUR,KLOR-CON) 20 MEQ tablet Take 1 tablet (20 mEq total) by mouth 2 (two) times daily. (Patient taking differently: Take 40 mEq by mouth. In the morning and 20 meq in thw evening.) 180 tablet 1  . rosuvastatin (CRESTOR) 40 MG tablet Take 1 tablet (40 mg total) by mouth daily. 90 tablet 3  . vitamin B-12 (CYANOCOBALAMIN) 1000 MCG tablet Take 1,000 mcg by mouth daily.    . Vitamin D, Ergocalciferol, (DRISDOL) 50000 units CAPS capsule TAKE ONE CAPSULE BY MOUTH EVERY SEVEN DAYS 12 capsule 0  . zolpidem (AMBIEN) 10 MG tablet TAKE 1/2 TO 1 TABLET AT BEDTIME AS NEEDED 90 tablet 1   No current facility-administered medications for this visit.     Allergies:   Iohexol; Ticlid [ticlopidine hcl]; and Codeine    ROS:  Please see the history of present illness.   Otherwise, review of systems are positive for none.   All other systems are reviewed and negative.    PHYSICAL EXAM: VS:  BP (!) 150/80   Pulse 80   Ht 5\' 6"  (1.676 m)   Wt 180 lb (81.6 kg)   BMI 29.05 kg/m  , BMI Body mass index is 29.05  kg/m. GENERAL:  Well appearing HEENT:  Pupils equal round and reactive, fundi not visualized, oral mucosa unremarkable NECK:  No jugular venous distention, waveform within normal limits, carotid upstroke brisk and symmetric, no bruits, no thyromegaly LYMPHATICS:  No cervical, inguinal adenopathy LUNGS:  Clear to auscultation bilaterally BACK:  No CVA tenderness CHEST:  Unremarkable HEART:  PMI not displaced or sustained,S1 and S2 within normal limits, no  S3, no S4, no clicks, no rubs, no murmurs ABD:  Flat, positive bowel sounds normal in frequency in pitch, no bruits, no rebound, no guarding, no midline pulsatile mass, no hepatomegaly, no splenomegaly EXT:  2 plus pulses throughout, no edema, no cyanosis no clubbing SKIN:  No rashes no nodules NEURO:  Cranial nerves II through XII grossly intact, motor grossly intact throughout PSYCH:  Cognitively intact, oriented to person place and time    EKG:  EKG is not ordered today. The ekg ordered 09/29/16 demonstrates sinus rhythm, rate 92, old inferior infarct, poor anterior R wave progression, no acute ST-T wave changes.   Recent Labs: 03/02/2016: Magnesium 1.8 07/03/2016: ALT 15 09/29/2016: BUN 13; Creatinine, Ser 1.14; Hemoglobin 14.5; Platelets 174; Potassium 3.3; Sodium 136    Lipid Panel    Component Value Date/Time   CHOL 96 (L) 08/23/2016 1127   CHOL 107 02/05/2013 1002   TRIG 110 08/23/2016 1127   TRIG 149 11/30/2015 0947   TRIG 143 02/05/2013 1002   HDL 28 (L) 08/23/2016 1127   HDL 35 (L) 11/30/2015 0947   HDL 35 (L) 02/05/2013 1002   CHOLHDL 3.4 08/23/2016 1127   CHOLHDL 4.8 09/02/2015 0608   VLDL 48 (H) 09/02/2015 0608   LDLCALC 46 08/23/2016 1127   LDLCALC 195 (H) 04/26/2014 1029   LDLCALC 43 02/05/2013 1002   LDLDIRECT 93 04/06/2015 1038      Wt Readings from Last 3 Encounters:  10/31/16 180 lb (81.6 kg)  09/29/16 176 lb (79.8 kg)  08/23/16 175 lb 6.4 oz (79.6 kg)      Other studies Reviewed: Additional  studies/ records that were reviewed today include:   ED records Review of the above records demonstrates:  Please see elsewhere in the note.     ASSESSMENT AND PLAN:  CAD:  The patient does have some chest pain sounds somewhat atypical. I will bring the patient back for a POET (Plain Old Exercise Test). This will allow me to screen for obstructive coronary disease, risk stratify and very importantly provide a prescription for exercise.  HTN:  Her blood pressure seems to be up and down and she's going to keep a blood pressure diary. She'll let me know these results.  CAROTID STENOSIS:  This was mild in the past.  No further work up is indicated.   CLAUDICATION:  She did have some leg pain previously but I felt good pulses in her feet and no further testing is indicated if she's not describing is currently.  HYPERLIPIDEMIA:    Her lipids are controlled as above. She'll continue the meds as listed.   Current medicines are reviewed at length with the patient today.  The patient does not have concerns regarding medicines.  The following changes have been made:  no change  Labs/ tests ordered today include:  No orders of the defined types were placed in this encounter.    Disposition:   FU with me in one year.     Signed, Minus Breeding, MD  10/31/2016 4:03 PM    [facility]

## 2016-10-31 ENCOUNTER — Encounter: Payer: Self-pay | Admitting: Cardiology

## 2016-10-31 ENCOUNTER — Ambulatory Visit (INDEPENDENT_AMBULATORY_CARE_PROVIDER_SITE_OTHER): Payer: PPO | Admitting: Cardiology

## 2016-10-31 ENCOUNTER — Other Ambulatory Visit: Payer: PPO

## 2016-10-31 VITALS — BP 150/80 | HR 80 | Ht 66.0 in | Wt 180.0 lb

## 2016-10-31 DIAGNOSIS — I10 Essential (primary) hypertension: Secondary | ICD-10-CM

## 2016-10-31 DIAGNOSIS — I25118 Atherosclerotic heart disease of native coronary artery with other forms of angina pectoris: Secondary | ICD-10-CM

## 2016-10-31 DIAGNOSIS — E875 Hyperkalemia: Secondary | ICD-10-CM

## 2016-10-31 NOTE — Patient Instructions (Signed)
Medication Instructions:  The current medical regimen is effective;  continue present plan and medications.  Testing/Procedures: Your physician has requested that you have an exercise tolerance test. For further information please visit [email or web address]. Please also follow instruction sheet, as given.  Follow-Up: Follow up in 6 months with Dr. Percival Spanish.  You will receive a letter in the mail 2 months before you are due.  Please call us when you receive this letter to schedule your follow up appointment.  If you need a refill on your cardiac medications before your next appointment, please call your pharmacy.  Thank you for choosing [facility]!!

## 2016-11-01 ENCOUNTER — Encounter: Payer: Self-pay | Admitting: Cardiology

## 2016-11-01 LAB — BMP8+EGFR
BUN / CREAT RATIO: 8 — AB (ref 12–28)
BUN: 9 mg/dL (ref 8–27)
CHLORIDE: 96 mmol/L (ref 96–106)
CO2: 29 mmol/L (ref 18–29)
Calcium: 9.5 mg/dL (ref 8.7–10.3)
Creatinine, Ser: 1.11 mg/dL — ABNORMAL HIGH (ref 0.57–1.00)
GFR calc Af Amer: 59 mL/min/{1.73_m2} — ABNORMAL LOW (ref >59)
GFR calc non Af Amer: 52 mL/min/{1.73_m2} — ABNORMAL LOW (ref >59)
GLUCOSE: 308 mg/dL — AB (ref 65–99)
POTASSIUM: 3.2 mmol/L — AB (ref 3.5–5.2)
SODIUM: 139 mmol/L (ref 134–144)

## 2016-11-09 ENCOUNTER — Ambulatory Visit (HOSPITAL_COMMUNITY)
Admission: RE | Admit: 2016-11-09 | Discharge: 2016-11-09 | Disposition: A | Discharge status: Home / Self Care | Payer: PPO | Source: Ambulatory Visit | Attending: Cardiology | Admitting: Cardiology

## 2016-11-09 DIAGNOSIS — I10 Essential (primary) hypertension: Secondary | ICD-10-CM | POA: Insufficient documentation

## 2016-11-09 DIAGNOSIS — I25118 Atherosclerotic heart disease of native coronary artery with other forms of angina pectoris: Secondary | ICD-10-CM | POA: Insufficient documentation

## 2016-11-09 LAB — EXERCISE TOLERANCE TEST
CHL CUP MPHR: 153 {beats}/min
CSEPEDS: 48 s
CSEPHR: 87 %
CSEPPHR: 134 {beats}/min
Estimated workload: 7 METS
Exercise duration (min): 4 min
RPE: 17
Rest HR: 72 {beats}/min

## 2016-11-20 ENCOUNTER — Telehealth: Payer: Self-pay | Admitting: Family Medicine

## 2016-11-20 NOTE — Telephone Encounter (Signed)
lmtcb jkp 2/27

## 2016-11-21 NOTE — Telephone Encounter (Signed)
Aware to follow up with Dr. Laurance Flatten to discuss stress test and other issues related.

## 2016-12-08 ENCOUNTER — Other Ambulatory Visit: Payer: Self-pay | Admitting: Family Medicine

## 2016-12-14 ENCOUNTER — Other Ambulatory Visit: Payer: Self-pay | Admitting: Family Medicine

## 2016-12-18 ENCOUNTER — Encounter: Payer: Self-pay | Admitting: Family Medicine

## 2016-12-18 ENCOUNTER — Ambulatory Visit (INDEPENDENT_AMBULATORY_CARE_PROVIDER_SITE_OTHER): Payer: PPO | Admitting: Family Medicine

## 2016-12-18 VITALS — BP 172/89 | HR 100 | Temp 97.3°F | Ht 66.0 in | Wt 177.0 lb

## 2016-12-18 DIAGNOSIS — Z8639 Personal history of other endocrine, nutritional and metabolic disease: Secondary | ICD-10-CM

## 2016-12-18 DIAGNOSIS — R0989 Other specified symptoms and signs involving the circulatory and respiratory systems: Secondary | ICD-10-CM | POA: Diagnosis not present

## 2016-12-18 DIAGNOSIS — Z794 Long term (current) use of insulin: Secondary | ICD-10-CM | POA: Diagnosis not present

## 2016-12-18 DIAGNOSIS — R7989 Other specified abnormal findings of blood chemistry: Secondary | ICD-10-CM

## 2016-12-18 DIAGNOSIS — I70213 Atherosclerosis of native arteries of extremities with intermittent claudication, bilateral legs: Secondary | ICD-10-CM

## 2016-12-18 DIAGNOSIS — I10 Essential (primary) hypertension: Secondary | ICD-10-CM | POA: Diagnosis not present

## 2016-12-18 DIAGNOSIS — E66811 Obesity, class 1: Secondary | ICD-10-CM

## 2016-12-18 DIAGNOSIS — I25119 Atherosclerotic heart disease of native coronary artery with unspecified angina pectoris: Secondary | ICD-10-CM

## 2016-12-18 DIAGNOSIS — IMO0002 Reserved for concepts with insufficient information to code with codable children: Secondary | ICD-10-CM

## 2016-12-18 DIAGNOSIS — E1165 Type 2 diabetes mellitus with hyperglycemia: Secondary | ICD-10-CM | POA: Diagnosis not present

## 2016-12-18 DIAGNOSIS — K219 Gastro-esophageal reflux disease without esophagitis: Secondary | ICD-10-CM

## 2016-12-18 DIAGNOSIS — E7800 Pure hypercholesterolemia, unspecified: Secondary | ICD-10-CM | POA: Diagnosis not present

## 2016-12-18 DIAGNOSIS — E669 Obesity, unspecified: Secondary | ICD-10-CM | POA: Diagnosis not present

## 2016-12-18 LAB — BAYER DCA HB A1C WAIVED: HB A1C (BAYER DCA - WAIVED): 10 % — ABNORMAL HIGH (ref <7.0)

## 2016-12-18 NOTE — Addendum Note (Signed)
Addended by: Zannie Cove on: 12/18/2016 02:29 PM   Modules accepted: Orders

## 2016-12-18 NOTE — Patient Instructions (Signed)
Medicare Annual Wellness Visit  [place] and the medical providers at [facility] strive to bring you the best medical care.  In doing so we not only want to address your current medical conditions and concerns but also to detect new conditions early and prevent illness, disease and health-related problems.    Medicare offers a yearly Wellness Visit which allows our clinical staff to assess your need for preventative services including immunizations, lifestyle education, counseling to decrease risk of preventable diseases and screening for fall risk and other medical concerns.    This visit is provided free of charge (no copay) for all Medicare recipients. The clinical pharmacists at [facility] have begun to conduct these Wellness Visits which will also include a thorough review of all your medications.    As you primary medical provider recommend that you make an appointment for your Annual Wellness Visit if you have not done so already this year.  You may set up this appointment before you leave today or you may call back ([phone]) and schedule an appointment.  Please make sure when you call that you mention that you are scheduling your Annual Wellness Visit with the clinical pharmacist so that the appointment may be made for the proper length of time.     Continue current medications. Continue good therapeutic lifestyle changes which include good diet and exercise. Fall precautions discussed with patient. If an FOBT was given today- please return it to our front desk. If you are over 50 years old - you may need Prevnar 13 or the adult Pneumonia vaccine.  **Flu shots are available--- please call and schedule a FLU-CLINIC appointment**  After your visit with us today you will receive a survey in the mail or online from [employer] regarding your care with us. Please take a moment to fill this out. Your feedback is very  important to us as you can help us better understand your patient needs as well as improve your experience and satisfaction. WE CARE ABOUT YOU!!!    

## 2016-12-18 NOTE — Progress Notes (Signed)
Subjective:    Patient ID: [name], female    DOB: 11/18/1950, 66 y.o.   MRN: [id number]  HPI Pt here for follow up and management of chronic medical problems which includes diabetes, hyperlipidemia and hypertension. She is taking medication regularly.This patient has a whole host of problems. Her blood sugars are usually elevated. She is always very calm and does not seem to keep good control of all the problems that she has. She does see the cardiologist periodically. She has hypertension hyperlipidemia and insulin-dependent diabetes. She also has ongoing problems with chest pain. She has reflux disease and carotid artery disease along with B12 deficiency. Her initial blood pressure on being present in the room today was elevated. The patient denies any chest pain or shortness of breath anymore than usual. She saw the cardiologist about a month ago and we'll see him again in about 5 months. She did have a stress test and to her knowledge that was normal. She denies any more shortness of breath than usual. She says her intestinal tract is good with no nausea vomiting diarrhea blood in the stool or black tarry bowel movements. Her last colonoscopy was in 2013 and she will not be due another one until 2023. She is passing her water without problems. The recent stress test was reviewed and her record.     Patient Active Problem List   Diagnosis Date Noted  . Swelling of upper arm 07/04/2016  . Superficial bruising of arm, right, sequela 07/04/2016  . B12 deficiency 01/13/2016  . Facial droop 09/01/2015  . Stroke ([place]) 09/01/2015  . TIA (transient ischemic attack) 08/14/2014  . DM type 2 with diabetic dyslipidemia ([place]) 08/14/2014  . Cataract 02/08/2014  . Macular degeneration 02/08/2014  . Elevated d-dimer, neg VQ scan 01/08/2014  . Unstable angina ([place]) 01/07/2014  . Chest pain 12/30/2013  . Generalized anxiety disorder 09/14/2013  . Claudication ([place]) 06/18/2013  . Diverticulosis of  colon 01/01/2012  . Left lower quadrant pain 01/01/2012  . GERD (gastroesophageal reflux disease) 01/01/2012  . Constipation 01/01/2012  . Hypokalemia 08/24/2011  . Uncontrolled type 2 diabetes mellitus with insulin therapy ([place]) 08/23/2011  . Essential hypertension, benign 08/23/2011  . PALPITATIONS 02/10/2010  . Hyperlipidemia 05/10/2009  . DEPRESSION 05/10/2009  . CAD, NATIVE VESSEL, cath 01/07/14 non obstructive coronary disease 08/26/2008  . Carotid artery disease ([place]) 08/26/2008   Outpatient Encounter Prescriptions as of 12/18/2016  Medication Sig  . ALPRAZolam (XANAX) 0.5 MG tablet take 1  Tablet by mouth 3 times daily AS NEEDED FOR ANXIETY OR SLEEP  . aspirin EC 325 MG tablet Take 325 mg by mouth daily.  Marland Kitchen atenolol (TENORMIN) 50 MG tablet Take 1 tablet (50 mg total) by mouth 2 (two) times daily.  . citalopram (CELEXA) 20 MG tablet Take 1 Tablet by mouth once daily  . clopidogrel (PLAVIX) 75 MG tablet Take 1 tablet (75 mg total) by mouth daily.  . furosemide (LASIX) 40 MG tablet Take 1 tablet (40 mg total) by mouth daily. (Patient taking differently: Take 40 mg by mouth daily. Take one daily and one additional tablet if needed)  . insulin aspart (NOVOLOG FLEXPEN) 100 UNIT/ML FlexPen INJECT 35 UNITS SQ 3 TIMES DAILY (Patient taking differently: Inject 45 Units into the skin 3 (three) times daily with meals. INJECT 35 UNITS SQ 3 TIMES DAILY)  . Insulin Degludec (TRESIBA FLEXTOUCH) 200 UNIT/ML SOPN Inject 70 Units into the skin daily.  . Insulin Lispro (HUMALOG KWIKPEN) 200 UNIT/ML SOPN  Inject 35 Units into the skin 3 (three) times daily as needed.  . isosorbide mononitrate (IMDUR) 120 MG 24 hr tablet Take 1 tablet (120 mg total) by mouth daily.  Marland Kitchen lisinopril (PRINIVIL,ZESTRIL) 40 MG tablet Take 1 tablet (40 mg total) by mouth daily.  . meclizine (ANTIVERT) 25 MG tablet Take 1 tablet (25 mg total) by mouth 3 (three) times daily as needed for dizziness.  . nitroGLYCERIN (NITROSTAT) 0.4  MG SL tablet DISSOLVE 1 TAB UNDER TOUNGE FOR CHEST PAIN. MAY REPEAT EVERY 5 MINUTES FOR 3 DOSES. IF NO RELIEF CALL 911 OR GO TO ER  . potassium chloride SA (K-DUR,KLOR-CON) 20 MEQ tablet Take 1 tablet (20 mEq total) by mouth 2 (two) times daily. (Patient taking differently: Take 40 mEq by mouth. In the morning and 20 meq in thw evening.)  . rosuvastatin (CRESTOR) 40 MG tablet Take 1 tablet (40 mg total) by mouth daily.  . vitamin B-12 (CYANOCOBALAMIN) 1000 MCG tablet Take 1,000 mcg by mouth daily.  . Vitamin D, Ergocalciferol, (DRISDOL) 50000 units CAPS capsule TAKE ONE CAPSULE BY MOUTH EVERY SEVEN DAYS  . zolpidem (AMBIEN) 10 MG tablet TAKE ONE-HALF TO ONE TABLET BY MOUTH at bedtime AS NEEDED  . [DISCONTINUED] metFORMIN (GLUCOPHAGE-XR) 500 MG 24 hr tablet Take 2 Tablets by mouth once daily with BREAKFAST   No facility-administered encounter medications on file as of 12/18/2016.      Review of Systems  Constitutional: Negative.   HENT: Negative.   Eyes: Negative.   Respiratory: Negative.   Cardiovascular: Negative.   Gastrointestinal: Negative.   Endocrine: Negative.   Genitourinary: Negative.   Musculoskeletal: Negative.   Skin: Negative.   Allergic/Immunologic: Negative.   Neurological: Negative.   Hematological: Negative.   Psychiatric/Behavioral: Negative.        Objective:   Physical Exam  Constitutional: She is oriented to person, place, and time. She appears well-developed and well-nourished. No distress.  Patient is calm and relaxed despite her elevated blood pressure and blood sugars.  HENT:  Head: Normocephalic and atraumatic.  Right Ear: External ear normal.  Left Ear: External ear normal.  Nose: Nose normal.  Mouth/Throat: Oropharynx is clear and moist.  Eyes: Conjunctivae and EOM are normal. Pupils are equal, round, and reactive to light. Right eye exhibits no discharge. Left eye exhibits no discharge. No scleral icterus.  Neck: Normal range of motion. Neck supple.  No thyromegaly present.  No bruits thyromegaly or anterior cervical adenopathy  Cardiovascular: Normal rate, regular rhythm and normal heart sounds.   No murmur heard. Pulses were diminished in the right foot compared to the left foot. The heart is regular at 96/m  Pulmonary/Chest: Effort normal and breath sounds normal. No respiratory distress. She has no wheezes. She has no rales.  Abdominal: Soft. Bowel sounds are normal. She exhibits no mass. There is no tenderness. There is no rebound and no guarding.  Abdominal obesity without masses tenderness or organ enlargement or bruits  Musculoskeletal: Normal range of motion. She exhibits no edema.  Lymphadenopathy:    She has no cervical adenopathy.  Neurological: She is alert and oriented to person, place, and time. She has normal reflexes. No cranial nerve deficit.  Skin: Skin is warm and dry. No rash noted.  Dry skin in general and many seborrheic keratoses on her back.  Psychiatric: She has a normal mood and affect. Her behavior is normal. Judgment and thought content normal.  Nursing note and vitals reviewed.  BP (!) 176/77 (BP Location: Left  Arm)   Pulse 100   Temp 97.3 F (36.3 C) (Oral)   Ht 5' 6"  (1.676 m)   Wt 177 lb (80.3 kg)   BMI 28.57 kg/m   Repeat blood pressure in right arm sitting manually was 198/92.      Assessment & Plan:  1. Low serum vitamin D -Continue current treatment pending results of lab work - CBC with Differential/Platelet - VITAMIN D 25 Hydroxy (Vit-D Deficiency, Fractures)  2. Essential hypertension, benign -Blood pressures were elevated today. The patient has not been checking her blood pressure home on a regular basis because her machine was broken. She will get another machine and check blood pressures more regularly and bring these readings to the next visit. - CBC with Differential/Platelet - BMP8+EGFR - Hepatic function panel  3. Type 2 diabetes mellitus with hyperglycemia, with long-term  current use of insulin (HCC) -According to the patient her blood sugar readings at home have been running in the 200-250 range and some even up as high as 600 on a few occasions. When her A1c is returned she will bring her readings with her monitor to the visit with the clinical pharmacist to further make efforts to get blood sugars down. - CBC with Differential/Platelet - BMP8+EGFR - Bayer DCA Hb A1c Waived  4. Pure hypercholesterolemia -Continue with current treatment and aggressive therapeutic lifestyle changes pending results of lab work - CBC with Differential/Platelet - Lipid panel  5. Gastroesophageal reflux disease, esophagitis presence not specified -No reflux complaints symptoms today. - CBC with Differential/Platelet - Hepatic function panel  6. History of hypokalemia -The patient has a history of hypokalemia. We will follow-up closely on this with her blood work.  7. Obesity (BMI 30.0-34.9) -She needs to continue to work on her weight and sodium restriction.  8. Uncontrolled type 2 diabetes mellitus with insulin therapy ([place]) -Schedule visit with clinical pharmacy for follow-up and bring meter and supper blood sugars can be reviewed  9. Atherosclerosis of native coronary artery of native heart with angina pectoris [facility]) -Follow-up with cardiology as planned  10. Atherosclerosis of native artery of both lower extremities with intermittent claudication ([place]) -Make sure that she has had recent Dopplers of the lower extremities done because of decreased circulation in right lower extremity  Patient Instructions                       Medicare Annual Wellness Visit  [place] and the medical providers at [facility] strive to bring you the best medical care.  In doing so we not only want to address your current medical conditions and concerns but also to detect new conditions early and prevent illness, disease and health-related problems.    Medicare  offers a yearly Wellness Visit which allows our clinical staff to assess your need for preventative services including immunizations, lifestyle education, counseling to decrease risk of preventable diseases and screening for fall risk and other medical concerns.    This visit is provided free of charge (no copay) for all Medicare recipients. The clinical pharmacists at [facility] have begun to conduct these Wellness Visits which will also include a thorough review of all your medications.    As you primary medical provider recommend that you make an appointment for your Annual Wellness Visit if you have not done so already this year.  You may set up this appointment before you leave today or you may call back ([phone]) and schedule  an appointment.  Please make sure when you call that you mention that you are scheduling your Annual Wellness Visit with the clinical pharmacist so that the appointment may be made for the proper length of time.    Continue current medications. Continue good therapeutic lifestyle changes which include good diet and exercise. Fall precautions discussed with patient. If an FOBT was given today- please return it to our front desk. If you are over 33 years old - you may need Prevnar 27 or the adult Pneumonia vaccine.  **Flu shots are available--- please call and schedule a FLU-CLINIC appointment**  After your visit with [name] today you will receive a survey in the mail or online from [employer] regarding your care with [name]. Please take a moment to fill this out. Your feedback is very important to [name] as you can help [name] better understand your patient needs as well as improve your experience and satisfaction. WE CARE ABOUT YOU!!!     Arrie Senate MD

## 2016-12-19 ENCOUNTER — Encounter: Payer: Commercial Managed Care - HMO | Admitting: *Deleted

## 2016-12-19 ENCOUNTER — Other Ambulatory Visit: Payer: Self-pay | Admitting: *Deleted

## 2016-12-19 ENCOUNTER — Other Ambulatory Visit: Payer: Self-pay

## 2016-12-19 ENCOUNTER — Other Ambulatory Visit (INDEPENDENT_AMBULATORY_CARE_PROVIDER_SITE_OTHER): Payer: PPO

## 2016-12-19 ENCOUNTER — Other Ambulatory Visit: Payer: Self-pay | Admitting: Family Medicine

## 2016-12-19 DIAGNOSIS — E1129 Type 2 diabetes mellitus with other diabetic kidney complication: Secondary | ICD-10-CM

## 2016-12-19 DIAGNOSIS — E876 Hypokalemia: Secondary | ICD-10-CM

## 2016-12-19 DIAGNOSIS — R739 Hyperglycemia, unspecified: Secondary | ICD-10-CM | POA: Diagnosis not present

## 2016-12-19 DIAGNOSIS — Z794 Long term (current) use of insulin: Principal | ICD-10-CM

## 2016-12-19 DIAGNOSIS — R0989 Other specified symptoms and signs involving the circulatory and respiratory systems: Secondary | ICD-10-CM

## 2016-12-19 LAB — LIPID PANEL
Chol/HDL Ratio: 3.3 ratio units (ref 0.0–4.4)
Cholesterol, Total: 116 mg/dL (ref 100–199)
HDL: 35 mg/dL — AB (ref >39)
LDL Calculated: 49 mg/dL (ref 0–99)
TRIGLYCERIDES: 159 mg/dL — AB (ref 0–149)
VLDL CHOLESTEROL CAL: 32 mg/dL (ref 5–40)

## 2016-12-19 LAB — CBC WITH DIFFERENTIAL/PLATELET
BASOS: 0 %
Basophils Absolute: 0 10*3/uL (ref 0.0–0.2)
EOS (ABSOLUTE): 0.1 10*3/uL (ref 0.0–0.4)
EOS: 1 %
HEMOGLOBIN: 14.5 g/dL (ref 11.1–15.9)
Hematocrit: 41.7 % (ref 34.0–46.6)
IMMATURE GRANS (ABS): 0 10*3/uL (ref 0.0–0.1)
Immature Granulocytes: 0 %
LYMPHS ABS: 2.5 10*3/uL (ref 0.7–3.1)
Lymphs: 29 %
MCH: 30.5 pg (ref 26.6–33.0)
MCHC: 34.8 g/dL (ref 31.5–35.7)
MCV: 88 fL (ref 79–97)
MONOCYTES: 6 %
Monocytes Absolute: 0.5 10*3/uL (ref 0.1–0.9)
NEUTROS ABS: 5.5 10*3/uL (ref 1.4–7.0)
Neutrophils: 64 %
PLATELETS: 188 10*3/uL (ref 150–379)
RBC: 4.75 x10E6/uL (ref 3.77–5.28)
RDW: 14.2 % (ref 12.3–15.4)
WBC: 8.7 10*3/uL (ref 3.4–10.8)

## 2016-12-19 LAB — BMP8+EGFR
BUN / CREAT RATIO: 10 — AB (ref 12–28)
BUN: 11 mg/dL (ref 8–27)
CALCIUM: 9.7 mg/dL (ref 8.7–10.3)
CHLORIDE: 91 mmol/L — AB (ref 96–106)
CO2: 27 mmol/L (ref 18–29)
Creatinine, Ser: 1.1 mg/dL — ABNORMAL HIGH (ref 0.57–1.00)
GFR calc Af Amer: 60 mL/min/{1.73_m2} (ref >59)
GFR calc non Af Amer: 52 mL/min/{1.73_m2} — ABNORMAL LOW (ref >59)
GLUCOSE: 419 mg/dL — AB (ref 65–99)
Potassium: 3 mmol/L — ABNORMAL LOW (ref 3.5–5.2)
Sodium: 138 mmol/L (ref 134–144)

## 2016-12-19 LAB — HEPATIC FUNCTION PANEL
ALT: 16 IU/L (ref 0–32)
AST: 18 IU/L (ref 0–40)
Albumin: 4.3 g/dL (ref 3.6–4.8)
Alkaline Phosphatase: 63 IU/L (ref 39–117)
BILIRUBIN, DIRECT: 0.19 mg/dL (ref 0.00–0.40)
Bilirubin Total: 0.5 mg/dL (ref 0.0–1.2)
TOTAL PROTEIN: 7.2 g/dL (ref 6.0–8.5)

## 2016-12-19 LAB — VITAMIN D 25 HYDROXY (VIT D DEFICIENCY, FRACTURES): VIT D 25 HYDROXY: 51.4 ng/mL (ref 30.0–100.0)

## 2016-12-19 LAB — GLUCOSE HEMOCUE WAIVED: GLU HEMOCUE WAIVED: 272 mg/dL — AB (ref 65–99)

## 2016-12-20 ENCOUNTER — Ambulatory Visit (INDEPENDENT_AMBULATORY_CARE_PROVIDER_SITE_OTHER): Payer: PPO | Admitting: Pharmacist

## 2016-12-20 VITALS — BP 134/80 | HR 80 | Ht 66.0 in | Wt 178.0 lb

## 2016-12-20 DIAGNOSIS — Z794 Long term (current) use of insulin: Secondary | ICD-10-CM

## 2016-12-20 DIAGNOSIS — E1165 Type 2 diabetes mellitus with hyperglycemia: Secondary | ICD-10-CM

## 2016-12-20 MED ORDER — INSULIN ASPART 100 UNIT/ML FLEXPEN
20.0000 [IU] | PEN_INJECTOR | Freq: Three times a day (TID) | SUBCUTANEOUS | 11 refills | Status: DC
Start: 2016-12-20 — End: 2017-01-16

## 2016-12-20 MED ORDER — METFORMIN HCL ER 500 MG PO TB24: 500.0000 mg | ORAL_TABLET | Freq: Every day | ORAL | 0 refills | Status: DC

## 2016-12-20 MED ORDER — INSULIN DEGLUDEC 200 UNIT/ML ~~LOC~~ SOPN: 50.0000 [IU] | PEN_INJECTOR | Freq: Every day | SUBCUTANEOUS | 2 refills | Status: DC

## 2016-12-20 NOTE — Patient Instructions (Signed)
Restart metformin XR 500mg  take 1 tablet once a day with largest meal of the day.   Restart Tresiba - Inject 50 units once a day.  Every 3 days increase by 2 units until blood glucose in morning is 150 or less.   Decrease Novolog to 20 units prior to each meal.   Call office if your blood glucose is not decreasing to less than 200 after 7 days for adjustment in insulin.

## 2016-12-21 LAB — BMP8+EGFR
BUN/Creatinine Ratio: 11 — ABNORMAL LOW (ref 12–28)
BUN: 12 mg/dL (ref 8–27)
CO2: 28 mmol/L (ref 18–29)
CREATININE: 1.12 mg/dL — AB (ref 0.57–1.00)
Calcium: 9.7 mg/dL (ref 8.7–10.3)
Chloride: 93 mmol/L — ABNORMAL LOW (ref 96–106)
GFR calc Af Amer: 58 mL/min/{1.73_m2} — ABNORMAL LOW (ref >59)
GFR, EST NON AFRICAN AMERICAN: 51 mL/min/{1.73_m2} — AB (ref >59)
GLUCOSE: 271 mg/dL — AB (ref 65–99)
Potassium: 3.4 mmol/L — ABNORMAL LOW (ref 3.5–5.2)
Sodium: 141 mmol/L (ref 134–144)

## 2016-12-21 LAB — BETA-HYDROXYBUTYRIC ACID: BETA HYDROXYBUTYRATE: 2.7 mg/dL

## 2016-12-23 NOTE — Progress Notes (Signed)
Patient ID: [name], female   DOB: 1951-07-17, 66 y.o.   MRN: [id number]   Chief Complaint:   Chief Complaint  Patient presents with  . Diabetes    HPI: Patient is here today to follow up uncontrolled type 2 diabetes.  She was referred by her PCP, Dr Redge Gainer after latest A1c was 10.0% (12/18/2016).  I have seen [name] in that past but our last visit was 05/2016.  She states that she stopped metformin several months ago due to cramps and loose stools.  She her medication list shows that she is suppose to be taking Tresiba 70 units daily and Novolog 35 to 40 units tid.  However upon further questioning she is only using the "orange and blue insulin" which is Novolog.  She has not had [place] in several months.    She also continues to have chronic low potassium but labs checked yesterday show potassium is improving.    Patient had an insulin pump for about 1 year but in July of 2015 her insurance stopped paying for pump supplies and patient was switched insulin pens.  She has also taken Invokana 300mg  1 tablet daily in the past but she stopped because she was concerned about side effects she saw on TV.  She has also taken Trulicity but copay was too high.   Home BG Monitoring:  Checking up to 2-3 times a day.  7 day avg = 378   14 day avg = 402 30 day avg = 393    Patient has keto sticks at home and tests when BG greater than 400.  She denies any positive urine ketone readings.  Exam  Vitals:   12/20/16 1004  Weight: 178 lb (80.7 kg)  Height: 5\' 6"  (1.676 m)   Vitals:   12/20/16 1004  BP: 134/80  Pulse: 80   RBG in office today = 283 A1c = 10.0%   General Appearance:  alert, oriented, no acute distress and obese Mood/Affect:  normal  Low fat/carbohydrate diet?  Yes - most days she is following CHO counting diet. Nicotine Abuse?  No Medication Compliance?  no Exercise?  No Alcohol Abuse?  No   Depression screen [facility] 2/9 12/18/2016 08/23/2016 07/03/2016 06/29/2016  06/21/2016  Decreased Interest 0 1 0 0 0  Down, Depressed, Hopeless 1 2 0 0 1  PHQ - 2 Score 1 3 0 0 1  Altered sleeping - 1 - - -  Tired, decreased energy - 3 - - -  Change in appetite - 2 - - -  Feeling bad or failure about yourself  - 0 - - -  Trouble concentrating - 0 - - -  Moving slowly or fidgety/restless - 0 - - -  Suicidal thoughts - 0 - - -  PHQ-9 Score - 9 - - -  Difficult doing work/chores - Not difficult at all - - -    Assessment: 1.  Diabetes.  Uncontrolled - likely due to non compliance with basal insulin 2.  Dyslipidemia - LDL at goal but Triglycerides are high and HDL low. Patient on statin. 3.  Hypokalemia -  Improved  Recommendations: 1.  Medication recommendations at this time are as follows:    Restart metformin XR 500mg  take 1 tablets daily with breakfast.  Restart Tresiba 50 units QD (gave #2 pens today)   Change Novolog to 20 units tid.      2.  Revewed HBG goals:  Fasting 80-130 and 1-2 hour post prandial <  180.  3. Patient is given sample of [employer].  She is taught how to use CGM at home, to change sensor q10 days,  Advised about 10 hours lock out.  She will bring in records of BG checked at home as we might need this for insurance to get CGM.  4.  Return to clinic 2 weeks to follow up diabetes.  Mrs. Haddix will have first visit with Dr Dorris Fetch 01/2017 and he will take over care of her DM at that time.   Time spent counseling patient:  45 minutes   Cherre Robins, PharmD, CPP, CDE

## 2016-12-24 ENCOUNTER — Ambulatory Visit (HOSPITAL_COMMUNITY): Payer: PPO

## 2016-12-27 ENCOUNTER — Telehealth: Payer: Self-pay | Admitting: Family Medicine

## 2016-12-27 MED ORDER — FREESTYLE LIBRE SENSOR SYSTEM MISC: 1 refills | Status: DC

## 2016-12-27 NOTE — Telephone Encounter (Signed)
Patient really likes Elenor Legato personal CGM system.  She is monitoring BG several times a day and since she restarted [place] she is seeing improved BG am 200's and some 150's in afternoon .  Paperwork filled out and faxed to [facility] for CGM sensors.  Patient is to record last week of BG and bring in to me in case it is required for sensors.

## 2016-12-28 ENCOUNTER — Ambulatory Visit (HOSPITAL_COMMUNITY)
Admission: RE | Admit: 2016-12-28 | Discharge: 2016-12-28 | Disposition: A | Discharge status: Home / Self Care | Payer: PPO | Source: Ambulatory Visit | Attending: Family Medicine | Admitting: Family Medicine

## 2016-12-28 DIAGNOSIS — I10 Essential (primary) hypertension: Secondary | ICD-10-CM | POA: Diagnosis not present

## 2016-12-28 DIAGNOSIS — R0989 Other specified symptoms and signs involving the circulatory and respiratory systems: Secondary | ICD-10-CM | POA: Diagnosis not present

## 2017-01-07 ENCOUNTER — Telehealth: Payer: Self-pay | Admitting: Family Medicine

## 2017-01-07 NOTE — Telephone Encounter (Signed)
Patient called to follow up on CMG order.  I faxed 12/27/216.   Called [facility] and they state that they have tried to call [name] because they have to confirm order with her before processed and shipped.  Patient given number to call [phone]

## 2017-01-16 ENCOUNTER — Ambulatory Visit (INDEPENDENT_AMBULATORY_CARE_PROVIDER_SITE_OTHER): Payer: PPO | Admitting: Pharmacist

## 2017-01-16 VITALS — BP 124/62 | HR 70

## 2017-01-16 DIAGNOSIS — E1165 Type 2 diabetes mellitus with hyperglycemia: Secondary | ICD-10-CM

## 2017-01-16 DIAGNOSIS — E876 Hypokalemia: Secondary | ICD-10-CM

## 2017-01-16 DIAGNOSIS — Z794 Long term (current) use of insulin: Secondary | ICD-10-CM | POA: Diagnosis not present

## 2017-01-16 MED ORDER — INSULIN ASPART 100 UNIT/ML FLEXPEN: 25.0000 [IU] | PEN_INJECTOR | Freq: Three times a day (TID) | SUBCUTANEOUS | 1 refills | Status: DC

## 2017-01-16 NOTE — Progress Notes (Signed)
Patient ID: [name], female   DOB: 01/01/1951, 66 y.o.   MRN: [id number]   Chief Complaint:   Chief Complaint  Patient presents with  . Diabetes    HPI: Patient is here today to follow up uncontrolled type 2 diabetes.  She was referred by her PCP, Dr Redge Gainer after latest A1c was 10.0% (12/18/2016).   I last saw her 12/20/2016 when metformin XR 562m qd and Tresiba 50 units qd were restarted.  She is also taking Novolog 20 units tid.   ALso at our last visit patient was given sample of LDennehotsopersonal CGM for 10 days use.  She liked the personal CGM.  [facility] them this week and they state that they are not contracted with her insurance to provide personal CGM.  She has chronic low potassium and take KCL 451m qam and 2043mqpm  Patient had an insulin pump for about 1 year but in July of 2015 her insurance stopped paying for pump supplies and patient was switched insulin pens. She has recently received calls from [employer] to see if she would like to upgrade to the new [employer] 630G pump.   She has also taken Invokana 300m19mtablet daily in the past but she stopped because she was concerned about side effects she saw on TV.  She has also taken Trulicity but copay was too high.   Home BG Monitoring:  Checking up to 2-3 times a day.  7 day avg = 267 ( down from 378)  14 day avg = 216 (down from 402) Ranges 147 to 400's    Patient has keto sticks at home and tests when BG greater than 400.  She denies any positive urine ketone readings.  Exam  There were no vitals filed for this visit. Vitals:   01/16/17 1056  BP: 124/62  Pulse: 70   A1c = 10.0%   General Appearance:  alert, oriented, no acute distress and obese Mood/Affect:  normal  Low fat/carbohydrate diet?  Yes, patient reports she has been following CHO counting diet Nicotine Abuse?  No Medication Compliance?  Yes Exercise?  No Alcohol Abuse?   No   Assessment: 1.  Diabetes.  Uncontrolled - but improving 2.  Hypokalemia   Recommendations: 1.  Medication recommendations at this time are as follows:    Continue metformin XR 500mg48me 1 tablets daily with breakfast.  Increase Tresiba to 55 units QD (gave #2 pens today)   Change Novolog to 25 units tid.     2.  Will continue to work to find another medical supply company to provide CGM.  Revewed HBG goals - Fasting 80-130 and 1-2 hour post prandial <180.  3.  Reviewed CHO counting diet and gave sample diet to patient.   4.  Return to clinic 4 weeks to follow up diabetes if needed.  Mrs. BrownDusek have first visit with Dr Nida Dorris Fetch18 and he will take over care of her DM at that time.  Orders Placed This Encounter  Procedures  . BMP8+EGFR     Time spent counseling patient:  30 minutes   TammyCherre RobinsrmD, CPP, CDE

## 2017-01-16 NOTE — Patient Instructions (Signed)
Continue metformin XR 535m take 1 tablets daily with breakfast. Increase Tresiba to 55 units once a day Increase Novolog to 25 units prior to each meal.    Hypoglycemia Hypoglycemia occurs when the level of sugar (glucose) in the blood is too low. Glucose is a type of sugar that provides the body's main source of energy. Certain hormones (insulin and glucagon) control the level of glucose in the blood. Insulin lowers blood glucose, and glucagon increases blood glucose. Hypoglycemia can result from having too much insulin in the bloodstream, or from not eating enough food that contains glucose. Hypoglycemia can happen in people who do or do not have diabetes. It can develop quickly, and it can be a medical emergency. What are the causes? Hypoglycemia occurs most often in people who have diabetes. If you have diabetes, hypoglycemia may be caused by:  Diabetes medicine. More likely to occur with insulin.  Not eating enough, or not eating often enough.  Increased physical activity.  Drinking alcohol, especially when you have not eaten recently. What increases the risk? Hypoglycemia is more likely to develop in:  People who have diabetes and take medicines to lower blood glucose.  People who abuse alcohol.  People who have a severe illness. What are the signs or symptoms? Hypoglycemia may not cause any symptoms. If you have symptoms, they may include:  Hunger.  Anxiety.  Sweating and feeling clammy.  Confusion.  Dizziness or feeling light-headed.  Sleepiness.  Nausea.  Increased heart rate.  Headache.  Blurry vision.  Seizure.  Nightmares.  Tingling or numbness around the mouth, lips, or tongue.  A change in speech.  Decreased ability to concentrate.  A change in coordination.  Restless sleep.  Tremors or shakes.  Fainting.  Irritability. How is this treated Low Blood glucose (Blood glucose less than 70)? This condition can often be treated by  immediately eating or drinking something that contains glucose / sugar, such as:  3-4 sugar tablets (glucose pills).  Glucose gel, 15-gram tube.  Fruit juice, 4 oz (120 mL).  Regular soda (not diet soda), 4 oz (120 mL).  Low-fat milk, 4 oz (120 mL).  Several pieces of hard candy.  Sugar or honey, 1 Tbsp. Treating Hypoglycemia If You Have Diabetes  If you are alert and able to swallow safely, follow the 15:15 rule:  Take 15 grams of a rapid-acting carbohydrate.  Rapid-acting options include:  1 tube of glucose gel.  3 glucose pills.  6-8 pieces of hard candy.  4 oz (120 mL) of fruit juice.  4 oz (120 ml) of regular (not diet) soda.  Check your blood glucose 15 to 20 minutes after you take the carbohydrate.  If the repeat blood glucose level is still at or below 70 mg/dL (3.9 mmol/L), take 15 grams of a carbohydrate again.  If your blood glucose level does not increase above 70 mg/dL (3.9 mmol/L) after 3 tries, seek emergency medical care.  After your blood glucose level returns to normal, eat a meal or a snack within 1 hour. Treating Severe Hypoglycemia  Severe hypoglycemia is when your blood glucose level is at or below 54 mg/dL (3 mmol/L). Severe hypoglycemia is an emergency. Do not wait to see if the symptoms will go away. Get medical help right away. Call your local emergency services (911 in the U[place] Do not drive yourself to the hospital. If you have severe hypoglycemia and you cannot eat or drink, you may need an injection of glucagon. A family member  or close friend should learn how to check your blood glucose and how to give you a glucagon injection. Ask your health care provider if you need to have an emergency glucagon injection kit available. Severe hypoglycemia may need to be treated in a hospital. The treatment may include getting glucose through an IV tube. You may also need treatment for the cause of your hypoglycemia. Follow these instructions at  home: General instructions  Avoid any diets that cause you to not eat enough food. Talk with your health care provider before you start any new diet.  Take over-the-counter and prescription medicines only as told by your health care provider.  Limit alcohol intake to no more than 1 drink per day for nonpregnant women and 2 drinks per day for men. One drink equals 12 oz of beer, 5 oz of wine, or 1 oz of hard liquor.  Keep all follow-up visits as told by your health care provider. This is important. If You Have Diabetes:   Make sure you know the symptoms of hypoglycemia.  Always have a rapid-acting carbohydrate snack with you to treat low blood sugar.  Follow your diabetes management plan, as told by your health care provider. Make sure you:  Take your medicines as directed.  Follow your exercise plan.  Follow your meal plan. Eat on time, and do not skip meals.  Check your blood glucose as often as directed. Make sure to check your blood glucose before and after exercise. If you exercise longer or in a different way than usual, check your blood glucose more often.  Follow your sick day plan whenever you cannot eat or drink normally. Make this plan in advance with your health care provider.  Share your diabetes management plan with people in your workplace, school, and household.  Check your urine for ketones when you are ill and as told by your health care provider.  C arry a medical alert card or wear medical alert jewelry. Contact a health care provider if:  You have problems keeping your blood glucose in your target range.  You have frequent episodes of hypoglycemia. (more than 1 episode per week) Get help right away if:  You continue to have hypoglycemia symptoms after eating or drinking something containing glucose.  Your blood glucose is at or below 54 mg/dL (3 mmol/L).  You have a seizure.  You faint. These symptoms may represent a serious problem that is an  emergency. Do not wait to see if the symptoms will go away. Get medical help right away. Call your local emergency services (911 in the [place] Do not drive yourself to the hospital.  This information is not intended to replace advice given to you by your health care provider. Make sure you discuss any questions you have with your health care provider.

## 2017-01-17 ENCOUNTER — Telehealth: Payer: Self-pay | Admitting: Family Medicine

## 2017-01-17 ENCOUNTER — Telehealth: Payer: Self-pay | Admitting: Pharmacist

## 2017-01-17 LAB — BMP8+EGFR
BUN/Creatinine Ratio: 12 (ref 12–28)
BUN: 11 mg/dL (ref 8–27)
CALCIUM: 9.8 mg/dL (ref 8.7–10.3)
CO2: 32 mmol/L — AB (ref 18–29)
Chloride: 94 mmol/L — ABNORMAL LOW (ref 96–106)
Creatinine, Ser: 0.93 mg/dL (ref 0.57–1.00)
GFR calc Af Amer: 73 mL/min/{1.73_m2} (ref >59)
GFR, EST NON AFRICAN AMERICAN: 63 mL/min/{1.73_m2} (ref >59)
Glucose: 139 mg/dL — ABNORMAL HIGH (ref 65–99)
Potassium: 3.6 mmol/L (ref 3.5–5.2)
Sodium: 143 mmol/L (ref 134–144)

## 2017-01-17 NOTE — Telephone Encounter (Signed)
Tried to call [employer] to get assitance with locating a DME supplier that is contracted with [facility] to provide [employer] personal CGM.  I was sent to provider line and instructed in how to search for DME providers.  I performed search and the only providers list were local pharmacies and DME such as [place] which are unable to order [employer] personal CGM at this time.  Confirmed this with [employer].  I then tried to speak to [employer] again but was told that only the patient could receive information from them and providers could only receive information from the provider line.  I called [name] to notify her that she will need to call [employer] for assistance.  In the meantime I will sent to [employer]

## 2017-01-18 NOTE — Telephone Encounter (Signed)
Patient aware of lab results and verbalizes understanding.  

## 2017-01-24 ENCOUNTER — Ambulatory Visit: Payer: PPO | Admitting: "Endocrinology

## 2017-02-01 ENCOUNTER — Telehealth: Payer: Self-pay | Admitting: Pharmacist

## 2017-02-01 NOTE — Telephone Encounter (Signed)
Called patient to verify orders received for CGM and insulin pump. At our last visit patient stated she did not want to restart insulin pump due to cost but did want to get Freestyle Libre Personal CGM.  Paperwork for insulin pump not filled out but did complete paperwork for CGM and faxed back to [facility]

## 2017-02-05 NOTE — Progress Notes (Signed)
Cardiology Office Note   Date:  02/06/2017   ID:  [name], DOB [birth year], MRN [id number]  PCP:  Chipper Herb, MD  Cardiologist:   Minus Breeding, MD    Chief Complaint  Patient presents with  . Chest Pain      History of Present Illness: [name] is a 66 y.o. female who presents for evaluation of chest pain.   She has a known history of stents to the LAD and PTCA to diagonal in 2008.    She had non obstructive CAD in 2015.  She was admitted with slurred speech in 2016 and had a negative work up with an unremarkable echo and mild carotid plaque.  She was in the ED in January with dehydration.  She had atypical pain when I saw her in Feb.  POET (Plain Old Exercise Treadmill) was intermediate risk.  I brought her back for follow up.  She continues to get chest discomfort. This has been the same pain she has had for some time although hard for her to quantify or qualify this. She thinks it might be the same as what she had in 2008 and 2015. It happens sporadically. It seems to happen with household chores but she is doing it.  It's a burning discomfort around her left breast. It might be 6 out of 10. There is some nausea. There is no radiation to her jaw or to her arms. She thinks it is somewhat stable pattern but it might also happens at rest at times. She's taken 2 sublingual nitroglycerin. She doesn't like to take them because it makes her head hurt. The discomfort might last for about 30 minutes before goes away on its own.   Past Medical History:  Diagnosis Date  . Anxiety   . CAD (coronary artery disease)    DES to circumflex 02/2007, BMS to LAD and PTCA diagonal 03/2007  . Carotid artery plaque    Mild  . Cataract   . Depression   . Diverticulitis, colon   . Elevated d-dimer 01/08/2014  . Essential hypertension, benign   . GERD (gastroesophageal reflux disease)   . H/O hiatal hernia   . HLD (hyperlipidemia)   . IDDM (insulin dependent diabetes mellitus) ([place])   .  Migraine    "used to have them really bad; don't have them anymore" (01/07/2014)  . MS (multiple sclerosis) ([place])    Not confirmed  . PAT (paroxysmal atrial tachycardia) ([place])   . Prolapse of uterus   . PVD (peripheral vascular disease) ([place])   . TIA (transient ischemic attack) 1980's    Past Surgical History:  Procedure Laterality Date  . ABDOMINAL HYSTERECTOMY  1986   ovaries remain - prolaspe uterus   . APPENDECTOMY  ~ 1970  . BREAST BIOPSY Right 1980's  . BREAST LUMPECTOMY Right 1980's   Dr. Charlynne Pander   . CARDIAC CATHETERIZATION  01/07/2014  . CHOLECYSTECTOMY  ?1987  . COLONOSCOPY  2002   Dr. Anwar--> Severe diverticular changes in the region of the sigmoid and descending colon with scattered diverticular changes throughout the rest of the colon. No polyps, ulcerations. Despite numerous manipulations, the tip of the scope could not be tipped into the cecal area.  . COLONOSCOPY  01/10/2012   Procedure: COLONOSCOPY;  Surgeon: Daneil Dolin, MD;  Location: [facility];  Service: Endoscopy;  Laterality: N/A;  1:55  . CORONARY ANGIOPLASTY WITH STENT PLACEMENT  ~ 1997 X 2   "2 +  1"  . EYE SURGERY Bilateral 2014   cataract  . LEFT HEART CATHETERIZATION WITH CORONARY ANGIOGRAM N/A 01/07/2014   Procedure: LEFT HEART CATHETERIZATION WITH CORONARY ANGIOGRAM;  Surgeon: Larey Dresser, MD;  Location: [facility] CATH LAB;  Service: Cardiovascular;  Laterality: N/A;     Current Outpatient Prescriptions  Medication Sig Dispense Refill  . ALPRAZolam (XANAX) 0.5 MG tablet take 1  Tablet by mouth 3 times daily AS NEEDED FOR ANXIETY OR SLEEP 90 tablet 0  . aspirin EC 81 MG tablet Take 1 tablet (81 mg total) by mouth daily.    Marland Kitchen atenolol (TENORMIN) 50 MG tablet Take 1 tablet (50 mg total) by mouth 2 (two) times daily. 180 tablet 2  . citalopram (CELEXA) 20 MG tablet Take 1 Tablet by mouth once daily 90 tablet 0  . clopidogrel (PLAVIX) 75 MG tablet Take 1 tablet (75 mg total) by mouth daily. 90 tablet 3    . furosemide (LASIX) 40 MG tablet Take 1 tablet (40 mg total) by mouth daily. (Patient taking differently: Take 40 mg by mouth daily. Take one daily and one additional tablet if needed) 90 tablet 2  . insulin aspart (NOVOLOG FLEXPEN) 100 UNIT/ML FlexPen Inject 25 Units into the skin 3 (three) times daily with meals. 15 mL 1  . Insulin Degludec (TRESIBA FLEXTOUCH) 200 UNIT/ML SOPN Inject 50-60 Units into the skin daily. 3 pen 2  . isosorbide mononitrate (IMDUR) 120 MG 24 hr tablet Take 1 tablet (120 mg total) by mouth daily. 90 tablet 1  . lisinopril (PRINIVIL,ZESTRIL) 40 MG tablet Take 0.5 tablets (20 mg total) by mouth 2 (two) times daily. 90 tablet 1  . meclizine (ANTIVERT) 25 MG tablet Take 1 tablet (25 mg total) by mouth 3 (three) times daily as needed for dizziness. 30 tablet 0  . metFORMIN (GLUCOPHAGE XR) 500 MG 24 hr tablet Take 1 tablet (500 mg total) by mouth daily with breakfast. 90 tablet 0  . nitroGLYCERIN (NITROSTAT) 0.4 MG SL tablet DISSOLVE 1 TAB UNDER TOUNGE FOR CHEST PAIN. MAY REPEAT EVERY 5 MINUTES FOR 3 DOSES. IF NO RELIEF CALL 911 OR GO TO ER 25 tablet 6  . potassium chloride SA (K-DUR,KLOR-CON) 20 MEQ tablet Take 40 mEq by mouth once.    . rosuvastatin (CRESTOR) 40 MG tablet Take 1 tablet (40 mg total) by mouth daily. 90 tablet 3  . vitamin B-12 (CYANOCOBALAMIN) 1000 MCG tablet Take 1,000 mcg by mouth daily.    . Vitamin D, Ergocalciferol, (DRISDOL) 50000 units CAPS capsule TAKE ONE CAPSULE BY MOUTH EVERY SEVEN DAYS 12 capsule 0  . zolpidem (AMBIEN) 10 MG tablet TAKE ONE-HALF TO ONE TABLET BY MOUTH at bedtime AS NEEDED 90 tablet 0  . amLODipine (NORVASC) 2.5 MG tablet Take 1 tablet (2.5 mg total) by mouth daily. 30 tablet 11  . Continuous Blood Gluc Sensor (FREESTYLE LIBRE SENSOR SYSTEM) MISC Use to check BG - change every 10 days.  DX: uncontrolled type 2 DM, requiring insulin therapy E11.65, Z79.4 9 each 1   No current facility-administered medications for this visit.      Allergies:   Iohexol; Ticlid [ticlopidine hcl]; and Codeine    ROS:  Please see the history of present illness.   Otherwise, review of systems are positive for emotional stress, vertigo.   All other systems are reviewed and negative.    PHYSICAL EXAM: VS:  BP (!) 150/70   Pulse 76   Ht 5\' 6"  (1.676 m)   Wt 181 lb (  82.1 kg)   BMI 29.21 kg/m  , BMI Body mass index is 29.21 kg/m.  GENERAL:  Well appearing NECK:  No jugular venous distention, waveform within normal limits, carotid upstroke brisk and symmetric, no bruits, no thyromegaly LUNGS:  Clear to auscultation bilaterally BACK:  No CVA tenderness CHEST:  Unremarkable HEART:  PMI not displaced or sustained,S1 and S2 within normal limits, no S3, no S4, no clicks, no rubs, no murmurs ABD:  Flat, positive bowel sounds normal in frequency in pitch, no bruits, no rebound, no guarding, no midline pulsatile mass, no hepatomegaly, no splenomegaly EXT:  2 plus pulses throughout, no edema, no cyanosis no clubbing   EKG:  EKG is ordered today. The ekg ordered 09/29/16 demonstrates sinus rhythm, rate 76 , old inferior infarct, poor anterior R wave progression, no acute ST-T wave changes. No change from previous.    Recent Labs: 03/02/2016: Magnesium 1.8 09/29/2016: Hemoglobin 14.5 12/18/2016: ALT 16; Platelets 188 01/16/2017: BUN 11; Creatinine, Ser 0.93; Potassium 3.6; Sodium 143    Lipid Panel    Component Value Date/Time   CHOL 116 12/18/2016 1101   CHOL 107 02/05/2013 1002   TRIG 159 (H) 12/18/2016 1101   TRIG 149 11/30/2015 0947   TRIG 143 02/05/2013 1002   HDL 35 (L) 12/18/2016 1101   HDL 35 (L) 11/30/2015 0947   HDL 35 (L) 02/05/2013 1002   CHOLHDL 3.3 12/18/2016 1101   CHOLHDL 4.8 09/02/2015 0608   VLDL 48 (H) 09/02/2015 0608   LDLCALC 49 12/18/2016 1101   LDLCALC 195 (H) 04/26/2014 1029   LDLCALC 43 02/05/2013 1002   LDLDIRECT 93 04/06/2015 1038      Wt Readings from Last 3 Encounters:  02/06/17 181 lb (82.1 kg)   12/20/16 178 lb (80.7 kg)  12/18/16 177 lb (80.3 kg)      Other studies Reviewed: Additional studies/ records that were reviewed today include:   POET (Plain Old Exercise Treadmill) Review of the above records demonstrates:   As above   ASSESSMENT AND PLAN:  CAD:  She had borderline results on a POET (Plain Old Exercise Treadmill) in Feb.  Her symptoms were atypical.  I brought her back to discuss this further.  Continues to have the symptoms. Given this she needs further risk stratification. She will have a [employer].  HTN:  Her blood pressure seems to be  Up.  I will add Norvasc daily.    CAROTID STENOSIS:  Mild.  No follow up indicated at this time.   HYPERLIPIDEMIA:     Her lipids are controlled as above.  Plans per Chipper Herb, MD   Current medicines are reviewed at length with the patient today.  The patient does not have concerns regarding medicines.  The following changes have been made:  As above  Labs/ tests ordered today include:    Orders Placed This Encounter  Procedures  . NM Myocar Multi W/Spect W/Wall Motion / EF  . EKG 12-Lead     Disposition:   FU with me in one year or sooner based on the results above and symptoms. Ronnell Guadalajara, MD  02/06/2017 10:36 AM    [place]

## 2017-02-06 ENCOUNTER — Encounter: Payer: Self-pay | Admitting: Cardiology

## 2017-02-06 ENCOUNTER — Ambulatory Visit (INDEPENDENT_AMBULATORY_CARE_PROVIDER_SITE_OTHER): Payer: PPO | Admitting: Cardiology

## 2017-02-06 VITALS — BP 150/70 | HR 76 | Ht 66.0 in | Wt 181.0 lb

## 2017-02-06 DIAGNOSIS — I25119 Atherosclerotic heart disease of native coronary artery with unspecified angina pectoris: Secondary | ICD-10-CM

## 2017-02-06 DIAGNOSIS — R079 Chest pain, unspecified: Secondary | ICD-10-CM

## 2017-02-06 DIAGNOSIS — I10 Essential (primary) hypertension: Secondary | ICD-10-CM | POA: Diagnosis not present

## 2017-02-06 MED ORDER — AMLODIPINE BESYLATE 2.5 MG PO TABS: 2.5000 mg | ORAL_TABLET | Freq: Every day | ORAL | 11 refills | Status: DC

## 2017-02-06 MED ORDER — ASPIRIN EC 81 MG PO TBEC: 81.0000 mg | DELAYED_RELEASE_TABLET | Freq: Every day | ORAL | Status: DC

## 2017-02-06 NOTE — Patient Instructions (Signed)
Medication Instructions:  Please decrease your ASA to 81 mg a day. Start Amlodipine 2.5 mg a day. Continue all other medications as listed.  Testing/Procedures: Your physician has requested that you have a lexiscan myoview. For further information please visit [email or web address]. Please follow instruction sheet, as given.  Follow-Up: Follow up in 6 months with Dr. Percival Spanish.  You will receive a letter in the mail 2 months before you are due.  Please call us when you receive this letter to schedule your follow up appointment.  If you need a refill on your cardiac medications before your next appointment, please call your pharmacy.  Thank you for choosing [facility]!!

## 2017-02-13 ENCOUNTER — Encounter (HOSPITAL_COMMUNITY): Payer: Self-pay

## 2017-02-13 ENCOUNTER — Encounter (INDEPENDENT_AMBULATORY_CARE_PROVIDER_SITE_OTHER)
Admission: RE | Admit: 2017-02-13 | Discharge: 2017-02-13 | Disposition: A | Discharge status: Home / Self Care | Payer: PPO | Source: Ambulatory Visit | Attending: Cardiology | Admitting: Cardiology

## 2017-02-13 ENCOUNTER — Encounter (HOSPITAL_COMMUNITY)
Admission: RE | Admit: 2017-02-13 | Discharge: 2017-02-13 | Disposition: A | Discharge status: Home / Self Care | Payer: PPO | Source: Ambulatory Visit | Attending: Cardiology | Admitting: Cardiology

## 2017-02-13 DIAGNOSIS — R079 Chest pain, unspecified: Secondary | ICD-10-CM

## 2017-02-13 LAB — NM MYOCAR MULTI W/SPECT W/WALL MOTION / EF
CHL CUP NUCLEAR SSS: 12
CSEPPHR: 81 {beats}/min
LV dias vol: 61 mL (ref 46–106)
LV sys vol: 20 mL
NUC STRESS TID: 1.14
RATE: 0.29
Rest HR: 65 {beats}/min
SDS: 2
SRS: 10

## 2017-02-13 MED ORDER — TECHNETIUM TC 99M TETROFOSMIN IV KIT
10.0000 | PACK | Freq: Once | INTRAVENOUS | Status: AC | PRN
  Administered 2017-02-13: 10 via INTRAVENOUS

## 2017-02-13 MED ORDER — SODIUM CHLORIDE 0.9% FLUSH
INTRAVENOUS | Status: AC
  Administered 2017-02-13: 10 mL via INTRAVENOUS
  Filled 2017-02-13: qty 10

## 2017-02-13 MED ORDER — TECHNETIUM TC 99M TETROFOSMIN IV KIT
30.0000 | PACK | Freq: Once | INTRAVENOUS | Status: AC | PRN
  Administered 2017-02-13: 30 via INTRAVENOUS

## 2017-02-13 MED ORDER — SODIUM CHLORIDE 0.9% FLUSH
INTRAVENOUS | Status: AC
Start: 2017-02-13 — End: 2017-02-13
  Filled 2017-02-13: qty 180

## 2017-02-13 MED ORDER — REGADENOSON 0.4 MG/5ML IV SOLN
INTRAVENOUS | Status: AC
  Administered 2017-02-13: 0.4 mg via INTRAVENOUS
  Filled 2017-02-13: qty 5

## 2017-02-14 ENCOUNTER — Telehealth: Payer: Self-pay | Admitting: Cardiology

## 2017-02-14 NOTE — Telephone Encounter (Signed)
New message    Pt is calling to see if results have been received from stress test.

## 2017-02-15 ENCOUNTER — Telehealth: Payer: Self-pay | Admitting: Family Medicine

## 2017-02-15 MED ORDER — ISOSORBIDE MONONITRATE ER 120 MG PO TB24: 120.0000 mg | ORAL_TABLET | Freq: Every day | ORAL | 1 refills | Status: DC

## 2017-02-15 NOTE — Telephone Encounter (Signed)
Returned call to patient She states her BP is running 165-195/75-100 She states her systolic is always greater 150 She had a headache this AM, when she got up she was staggering around Has not eaten a lot of salty foods in the las few weeks  Reviewed meds with patient  -- She has been out of imdur 120mg  for a few months - was refilled by PCP. Rx(s) sent to pharmacy electronically. Advised this may help lower her BP  -- She is taking lasix 40mg  daily + extra dose daily PRN for swelling -- lisinopril + amlodipine (started on 5/16 by MD) + atenolol on file are correct dose/freq  Advised that prelim stress test result is "low risk"  Will route to pharmacy staff for hypertension management recommendations

## 2017-02-15 NOTE — Telephone Encounter (Signed)
Talked to patient today.  Her "dizziness/headaches are all the time and may be related to her vertigo"  BP 175/99 this morning but noted some improvement since amlodipine started.   Stated had been out of [facility] for almost a months now and is checking her BP 4 to 5 times every day.  Recommendations: 1. Resume Imdur 120mg  today 2. Continue all other medication as prescribed. (Amlodipine may take 2-3 weeks to show full effect and already improvement noticed) 3. Monitor BP  Morning and evening ONLY (keep records) 4. Call back in 5-7 days to discuss potential increase on amlodipine if/as needed. 5. Visit Urgent Care if blood pressure above 272 systolic

## 2017-02-15 NOTE — Telephone Encounter (Signed)
Pt aware of samples  °

## 2017-02-15 NOTE — Telephone Encounter (Signed)
Follow up    Pt is calling about results of stress test. She said if she does not answer to leave a message.   Pt c/o BP issue: STAT if pt c/o blurred vision, one-sided weakness or slurred speech  1. What are your last 5 BP readings? 175/99  2. Are you having any other symptoms (ex. Dizziness, headache, blurred vision, passed out)? Headache, dizzy  3. What is your BP issue? Pt states she is on a new medication and it it not helping. Her bp is high.

## 2017-02-19 ENCOUNTER — Telehealth: Payer: Self-pay | Admitting: Family Medicine

## 2017-02-19 NOTE — Telephone Encounter (Signed)
Patient has to have glucometer records that indicate that she is checking BG at least 4 times per day to get CGM.  She is coming 02/21/2017.  She should bring her glucometer and I can then resubmit paperwork indicating that she is checking more frequently.

## 2017-02-21 ENCOUNTER — Ambulatory Visit: Payer: Self-pay | Admitting: Pharmacist

## 2017-02-28 ENCOUNTER — Telehealth: Payer: Self-pay | Admitting: Family Medicine

## 2017-02-28 NOTE — Telephone Encounter (Signed)
Patient aware we are out of [place].

## 2017-03-04 ENCOUNTER — Telehealth: Payer: Self-pay | Admitting: Cardiology

## 2017-03-04 NOTE — Telephone Encounter (Signed)
Patient states that she had a stress test and the results showed that she wasn't getting enough blood flow. Patient states that she was told that Dr. Percival Spanish would call in a prescription for a new medication that would help her feel better. Please call to discuss, thanks.

## 2017-03-04 NOTE — Telephone Encounter (Signed)
Spoke with [name], she was told about her abnormal stress test and has a follow up appt but sdhe reports having to take 3 NTG yesterday and 1 NTG this morning. She is having a constant heaviness across her chest that does not change with movement or taking a deep breath. The NTG does not help the discomfort nor does indigestion medications. She complains of SOB with any exertion and fatigue. She would like to be seen sooner than 04-03-17. Follow up scheduled Wednesday this week when dr hochrein is in [place]. Patient voiced understanding to go to the ER if symptoms change.

## 2017-03-05 ENCOUNTER — Telehealth: Payer: Self-pay | Admitting: Family Medicine

## 2017-03-05 NOTE — Telephone Encounter (Signed)
We do not have any glucometer currently. We can send in Rx if she needs.

## 2017-03-05 NOTE — Telephone Encounter (Signed)
Patient aware.  She said she cannot afford the prescription right now because she is in her donut hole.  She asked that you make a note and if we receive another Accu-check, please give her a call.

## 2017-03-05 NOTE — Telephone Encounter (Signed)
ok 

## 2017-03-06 ENCOUNTER — Ambulatory Visit (INDEPENDENT_AMBULATORY_CARE_PROVIDER_SITE_OTHER): Payer: PPO | Admitting: Cardiology

## 2017-03-06 ENCOUNTER — Encounter: Payer: Self-pay | Admitting: Cardiology

## 2017-03-06 ENCOUNTER — Other Ambulatory Visit: Payer: PPO

## 2017-03-06 VITALS — BP 160/80 | HR 76 | Ht 66.0 in | Wt 182.0 lb

## 2017-03-06 DIAGNOSIS — R9439 Abnormal result of other cardiovascular function study: Secondary | ICD-10-CM

## 2017-03-06 DIAGNOSIS — I200 Unstable angina: Secondary | ICD-10-CM

## 2017-03-06 DIAGNOSIS — E118 Type 2 diabetes mellitus with unspecified complications: Secondary | ICD-10-CM | POA: Diagnosis not present

## 2017-03-06 DIAGNOSIS — Z87898 Personal history of other specified conditions: Secondary | ICD-10-CM | POA: Diagnosis not present

## 2017-03-06 DIAGNOSIS — I10 Essential (primary) hypertension: Secondary | ICD-10-CM

## 2017-03-06 DIAGNOSIS — Z01818 Encounter for other preprocedural examination: Secondary | ICD-10-CM | POA: Insufficient documentation

## 2017-03-06 MED ORDER — PREDNISONE 20 MG PO TABS: 60.0000 mg | ORAL_TABLET | ORAL | 0 refills | Status: DC

## 2017-03-06 MED ORDER — AMLODIPINE BESYLATE 5 MG PO TABS: 5.0000 mg | ORAL_TABLET | Freq: Every day | ORAL | 11 refills | Status: DC

## 2017-03-06 NOTE — Patient Instructions (Signed)
Medication Instructions:  Please increase your Amlodipine to 5 mg a day. Continue all other medications as listed.  Labwork: Please have blood work today at [facility] (BMP, CBC and PT/INR)  Testing/Procedures: Your physician has requested that you have a cardiac catheterization. Cardiac catheterization is used to diagnose and/or treat various heart conditions. Doctors may recommend this procedure for a number of different reasons. The most common reason is to evaluate chest pain. Chest pain can be a symptom of coronary artery disease (CAD), and cardiac catheterization can show whether plaque is narrowing or blocking your heart's arteries. This procedure is also used to evaluate the valves, as well as measure the blood flow and oxygen levels in different parts of your heart. For further information please visit [email or web address]. Please follow instruction sheet, as given.  Follow-Up: Follow up as directed with Dr Percival Spanish, after your cardiac cath.  If you need a refill on your cardiac medications before your next appointment, please call your pharmacy.  Thank you for choosing [name]!!      [name] [place] [address] Dept: [phone] Loc: [phone]  [name]  03/06/2017  You are scheduled for a cardiac cath on Friday March 08, 2017 with Dr. Saunders Revel.  1. Please arrive at the [facility] (Main Entrance A) at [facility]: [place], [place] [address] at 5:30 am (two hours before your procedure to ensure your preparation). Free valet parking service is available.   Special note: Every effort is made to have your procedure done on time. Please understand that emergencies sometimes delay scheduled procedures.  2. Diet: Nothing to eat/drink after midnight.  3. Labs: Please have blood work today at [facility]  4. Medication instructions in preparation for your  procedure: Please take Prednisone 20 mg (3) tablets the night before and (3) tablets the morning of your cath. Take Benadryl 25 mg the night before and the morning of as well. You may hold your Furosemide this AM.  Stop Metformin 24 hours before and 48 hours after your cath.  Hold Insulin this AM  Take all other medications as listed with enough water to get them down.  5. Plan for one night stay--bring personal belongings. 6. Bring a current list of your medications and current insurance cards. 7. You MUST have a responsible person to drive you home. 8. Someone MUST be with you the first 24 hours after you arrive home or your discharge will be delayed. 9. Please wear clothes that are easy to get on and off and wear slip-on shoes.  Thank you for allowing [name] to care for you!   -- [place] [facility]

## 2017-03-06 NOTE — Progress Notes (Signed)
Cardiology Office Note   Date:  03/06/2017   ID:  [name], DOB [birth year], MRN [id number]  PCP:  Chipper Herb, MD  Cardiologist:   Minus Breeding, MD    Chief Complaint  Patient presents with  . Chest Pain      History of Present Illness: [name] is a 66 y.o. female who presents for evaluation of chest pain.   She has a known history of stents to the LAD and PTCA to diagonal in 2008.    She had non obstructive CAD in 2015.  She was admitted with slurred speech in 2016 and had a negative work up with an unremarkable echo and mild carotid plaque.  She was in the ED in January with dehydration.  She had atypical pain when I saw her in Feb.  POET (Plain Old Exercise Treadmill) was intermediate risk.  She had a follow-up perfusion study last month that suggest some possible mild ischemia in the basal inferolateral, mid inferolateral, apical inferior and apical lateral locations. She called the past couple of days and was having chest pain. This has been a constant discomfort. It is similar to previous. It is 7 out of 10 at its peak . She says they're occasional sharp discomforts and then also a dull discomfort. When she was short of breath on Sunday she took 3 nitroglycerin. She took 1 on Monday. She feels very fatigued. She feels dizzy when she stands up. She's had no arm or neck discomfort. She thinks this is similar to previous complaints.   Past Medical History:  Diagnosis Date  . Anxiety   . CAD (coronary artery disease)    DES to circumflex 02/2007, BMS to LAD and PTCA diagonal 03/2007  . Carotid artery plaque    Mild  . Cataract   . Depression   . Diverticulitis, colon   . Elevated d-dimer 01/08/2014  . Essential hypertension, benign   . GERD (gastroesophageal reflux disease)   . H/O hiatal hernia   . HLD (hyperlipidemia)   . IDDM (insulin dependent diabetes mellitus) ([place])   . Migraine    "used to have them really bad; don't have them anymore" (01/07/2014)  . MS  (multiple sclerosis) ([place])    Not confirmed  . PAT (paroxysmal atrial tachycardia) ([place])   . Prolapse of uterus   . PVD (peripheral vascular disease) ([place])   . TIA (transient ischemic attack) 1980's    Past Surgical History:  Procedure Laterality Date  . ABDOMINAL HYSTERECTOMY  1986   ovaries remain - prolaspe uterus   . APPENDECTOMY  ~ 1970  . BREAST BIOPSY Right 1980's  . BREAST LUMPECTOMY Right 1980's   Dr. Charlynne Pander   . CARDIAC CATHETERIZATION  01/07/2014  . CHOLECYSTECTOMY  ?1987  . COLONOSCOPY  2002   Dr. Anwar--> Severe diverticular changes in the region of the sigmoid and descending colon with scattered diverticular changes throughout the rest of the colon. No polyps, ulcerations. Despite numerous manipulations, the tip of the scope could not be tipped into the cecal area.  . COLONOSCOPY  01/10/2012   Procedure: COLONOSCOPY;  Surgeon: Daneil Dolin, MD;  Location: [facility];  Service: Endoscopy;  Laterality: N/A;  1:55  . CORONARY ANGIOPLASTY WITH STENT PLACEMENT  ~ 1997 X 2   "2 + 1"  . EYE SURGERY Bilateral 2014   cataract  . LEFT HEART CATHETERIZATION WITH CORONARY ANGIOGRAM N/A 01/07/2014   Procedure: LEFT HEART CATHETERIZATION WITH CORONARY ANGIOGRAM;  Surgeon: Larey Dresser, MD;  [facility] C[facility];  Service: Cardiovascular;  Laterality: N/A;     Current Outpatient Prescriptions  Medication Sig Dispense Refill  . ALPRAZolam (XANAX) 0.5 MG tablet take 1  Tablet by mouth 3 times daily AS NEEDED FOR ANXIETY OR SLEEP 90 tablet 0  . amLODipine (NORVASC) 5 MG tablet Take 1 tablet (5 mg total) by mouth daily. 30 tablet 11  . aspirin EC 81 MG tablet Take 1 tablet (81 mg total) by mouth daily.    Marland Kitchen atenolol (TENORMIN) 50 MG tablet Take 1 tablet (50 mg total) by mouth 2 (two) times daily. 180 tablet 2  . citalopram (CELEXA) 20 MG tablet Take 1 Tablet by mouth once daily 90 tablet 0  . clopidogrel (PLAVIX) 75 MG tablet Take 1 tablet (75 mg total) by mouth daily. 90  tablet 3  . furosemide (LASIX) 40 MG tablet Take 1 tablet (40 mg total) by mouth daily. (Patient taking differently: Take 40 mg by mouth daily. Take one daily and one additional tablet if needed) 90 tablet 2  . insulin aspart (NOVOLOG FLEXPEN) 100 UNIT/ML FlexPen Inject 25 Units into the skin 3 (three) times daily with meals. (Patient taking differently: Inject 35 Units into the skin 3 (three) times daily with meals. ) 15 mL 1  . Insulin Degludec (TRESIBA FLEXTOUCH) 200 UNIT/ML SOPN Inject 50-60 Units into the skin daily. 3 pen 2  . isosorbide mononitrate (IMDUR) 120 MG 24 hr tablet Take 1 tablet (120 mg total) by mouth daily. 90 tablet 1  . lisinopril (PRINIVIL,ZESTRIL) 40 MG tablet Take 40 mg by mouth daily.  90 tablet 1  . meclizine (ANTIVERT) 25 MG tablet Take 1 tablet (25 mg total) by mouth 3 (three) times daily as needed for dizziness. 30 tablet 0  . metFORMIN (GLUCOPHAGE XR) 500 MG 24 hr tablet Take 1 tablet (500 mg total) by mouth daily with breakfast. 90 tablet 0  . nitroGLYCERIN (NITROSTAT) 0.4 MG SL tablet DISSOLVE 1 TAB UNDER TOUNGE FOR CHEST PAIN. MAY REPEAT EVERY 5 MINUTES FOR 3 DOSES. IF NO RELIEF CALL 911 OR GO TO ER 25 tablet 6  . potassium chloride SA (K-DUR,KLOR-CON) 20 MEQ tablet Take 40 mEq by mouth daily.     . rosuvastatin (CRESTOR) 40 MG tablet Take 1 tablet (40 mg total) by mouth daily. 90 tablet 3  . vitamin B-12 (CYANOCOBALAMIN) 1000 MCG tablet Take 1,000 mcg by mouth daily.    . Vitamin D, Ergocalciferol, (DRISDOL) 50000 units CAPS capsule TAKE ONE CAPSULE BY MOUTH EVERY SEVEN DAYS 12 capsule 0  . zolpidem (AMBIEN) 10 MG tablet TAKE ONE-HALF TO ONE TABLET BY MOUTH at bedtime AS NEEDED 90 tablet 0  . Continuous Blood Gluc Sensor (FREESTYLE LIBRE SENSOR SYSTEM) MISC Use to check BG - change every 10 days.  DX: uncontrolled type 2 DM, requiring insulin therapy E11.65, Z79.4 9 each 1  . predniSONE (DELTASONE) 20 MG tablet Take 3 tablets (60 mg total) by mouth as directed. Take  3 tablets the night before and 3 the AM of your cath 6 tablet 0   No current facility-administered medications for this visit.     Allergies:   Iohexol; Ticlid [ticlopidine hcl]; and Codeine    ROS:  Please see the history of present illness.   Otherwise, review of systems are positive for Dizziness, stress, fatigue.   All other systems are reviewed and negative.    PHYSICAL EXAM: VS:  BP (!) 160/80  Pulse 76   Ht 5\' 6"  (1.676 m)   Wt 182 lb (82.6 kg)   BMI 29.38 kg/m  , BMI Body mass index is 29.38 kg/m.  GENERAL:  Well appearing HEENT:  Pupils equal round and reactive, fundi not visualized, oral mucosa unremarkable NECK:  No jugular venous distention, waveform within normal limits, carotid upstroke brisk and symmetric, no bruits, no thyromegaly LYMPHATICS:  No cervical, inguinal adenopathy LUNGS:  Clear to auscultation bilaterally BACK:  No CVA tenderness CHEST:  Unremarkable HEART:  PMI not displaced or sustained,S1 and S2 within normal limits, no S3, no S4, no clicks, no rubs, no murmurs ABD:  Flat, positive bowel sounds normal in frequency in pitch, no bruits, no rebound, no guarding, no midline pulsatile mass, no hepatomegaly, no splenomegaly EXT:  2 plus pulses throughout, no edema, no cyanosis no clubbing SKIN:  No rashes no nodules NEURO:  Cranial nerves II through XII grossly intact, motor grossly intact throughout PSYCH:  Cognitively intact, oriented to person place and time   EKG:  EKG is ordered today. The ekg ordered 09/29/16 demonstrates sinus rhythm, rate 75,  QTC prolonged, diffuse nonspecific ST-T wave changes, left axis deviation, poor anterior R wave progression   Recent Labs: 12/18/2016: ALT 16; Hemoglobin 14.5; Platelets 188 01/16/2017: BUN 11; Creatinine, Ser 0.93; Potassium 3.6; Sodium 143    Lipid Panel    Component Value Date/Time   CHOL 116 12/18/2016 1101   CHOL 107 02/05/2013 1002   TRIG 159 (H) 12/18/2016 1101   TRIG 149 11/30/2015 0947    TRIG 143 02/05/2013 1002   HDL 35 (L) 12/18/2016 1101   HDL 35 (L) 11/30/2015 0947   HDL 35 (L) 02/05/2013 1002   CHOLHDL 3.3 12/18/2016 1101   CHOLHDL 4.8 09/02/2015 0608   VLDL 48 (H) 09/02/2015 0608   LDLCALC 49 12/18/2016 1101   LDLCALC 195 (H) 04/26/2014 1029   LDLCALC 43 02/05/2013 1002   LDLDIRECT 93 04/06/2015 1038      Wt Readings from Last 3 Encounters:  03/06/17 182 lb (82.6 kg)  02/06/17 181 lb (82.1 kg)  12/20/16 178 lb (80.7 kg)      Other studies Reviewed: Additional studies/ records that were reviewed today include:   Lexiscan Myoview. Review of the above records demonstrates:   As above   ASSESSMENT AND PLAN:  CAD:  Given the ongoing symptoms and the abnormal perfusion study we will need to do another cardiac cath. The patient understands that risks included but are not limited to stroke (1 in 1000), death (1 in 66), kidney failure [usually temporary] (1 in 500), bleeding (1 in 200), allergic reaction [possibly serious] (1 in 200).  The patient understands and agrees to proceed.   HTN:  Her blood pressure seems to be  Up.  I will increase the Norvasc.    CAROTID STENOSIS:  Mild.  No follow up indicated at this time.   HYPERLIPIDEMIA:     Her lipids are at target.  She will continue the meds as listed.   DM:  A1C 10 most recently.    She recently had her meds adjusted by Dr. Laurance Flatten  DYE ALLERGY:  Premedication will be ordered.   QT:  This is prolonged compared to previous but she has no symptoms consistent with QT syndrome.  She should avoid QT prolonging meds.    Current medicines are reviewed at length with the patient today.  The patient does not have concerns regarding medicines.  The following changes have  been made:  As above  Labs/ tests ordered today include:    Orders Placed This Encounter  Procedures  . CBC  . Basic metabolic panel  . Protime-INR  . EKG 12-Lead     Disposition:   FU with me in one year or sooner based on the results  above and symptoms.    Signed, Minus Breeding, MD  03/06/2017 4:06 PM    [facility]

## 2017-03-07 ENCOUNTER — Telehealth: Payer: Self-pay

## 2017-03-07 LAB — BASIC METABOLIC PANEL
BUN / CREAT RATIO: 13 (ref 12–28)
BUN: 12 mg/dL (ref 8–27)
CALCIUM: 9.7 mg/dL (ref 8.7–10.3)
CHLORIDE: 93 mmol/L — AB (ref 96–106)
CO2: 33 mmol/L — ABNORMAL HIGH (ref 20–29)
Creatinine, Ser: 0.94 mg/dL (ref 0.57–1.00)
GFR calc Af Amer: 72 mL/min/{1.73_m2} (ref >59)
GFR calc non Af Amer: 63 mL/min/{1.73_m2} (ref >59)
GLUCOSE: 212 mg/dL — AB (ref 65–99)
POTASSIUM: 2.9 mmol/L — AB (ref 3.5–5.2)
Sodium: 142 mmol/L (ref 134–144)

## 2017-03-07 LAB — CBC
Hematocrit: 38.8 % (ref 34.0–46.6)
Hemoglobin: 13 g/dL (ref 11.1–15.9)
MCH: 29.3 pg (ref 26.6–33.0)
MCHC: 33.5 g/dL (ref 31.5–35.7)
MCV: 88 fL (ref 79–97)
PLATELETS: 199 10*3/uL (ref 150–379)
RBC: 4.43 x10E6/uL (ref 3.77–5.28)
RDW: 14.8 % (ref 12.3–15.4)
WBC: 7.7 10*3/uL (ref 3.4–10.8)

## 2017-03-07 LAB — PLEASE NOTE

## 2017-03-07 LAB — PROTIME-INR
INR: 1 (ref 0.8–1.2)
Prothrombin Time: 10.3 s (ref 9.1–12.0)

## 2017-03-07 NOTE — Progress Notes (Signed)
error 

## 2017-03-07 NOTE — Telephone Encounter (Signed)
Patient contacted pre-catheterization at [facility] scheduled for: 03/08/2017 @ 0730  Verified arrival time and place:  [facility] @ 0530  Confirmed AM meds to be taken pre-cath with sip of water:  Pt expressed understanding of what medications not to take.  Pt asked about taking her Plavix and ASA.  Notified Pt to take her ASA and Plavix in am with rest of approved meds.    Confirmed patient has responsible person to drive home post procedure and observe patient for 24 hours:  Pt states her daughters will be with her.    Addl concerns:   Verbal order received for Pt to take 80 meq potassium oral x1 now.  Pt with potassium 2.9 drawn 03/06/2017.  Pt notified and indicated understanding.   Pt with dye allergy.  Pt prescribed 60 mg prednisone to take night before procedure and morning of.  Pt also instructed to take Benadryl 25 mg with same schedule as prednisone.

## 2017-03-08 ENCOUNTER — Encounter (HOSPITAL_COMMUNITY)
Admission: RE | Disposition: A | Discharge status: Home / Self Care | Payer: Self-pay | Source: Ambulatory Visit | Attending: Internal Medicine

## 2017-03-08 ENCOUNTER — Ambulatory Visit (HOSPITAL_COMMUNITY)
Admission: RE | Admit: 2017-03-08 | Discharge: 2017-03-08 | Disposition: A | Discharge status: Home / Self Care | Payer: PPO | Source: Ambulatory Visit | Attending: Internal Medicine | Admitting: Internal Medicine

## 2017-03-08 DIAGNOSIS — E1151 Type 2 diabetes mellitus with diabetic peripheral angiopathy without gangrene: Secondary | ICD-10-CM | POA: Diagnosis not present

## 2017-03-08 DIAGNOSIS — I200 Unstable angina: Secondary | ICD-10-CM | POA: Diagnosis present

## 2017-03-08 DIAGNOSIS — Z955 Presence of coronary angioplasty implant and graft: Secondary | ICD-10-CM | POA: Diagnosis not present

## 2017-03-08 DIAGNOSIS — Z7982 Long term (current) use of aspirin: Secondary | ICD-10-CM | POA: Insufficient documentation

## 2017-03-08 DIAGNOSIS — I6529 Occlusion and stenosis of unspecified carotid artery: Secondary | ICD-10-CM | POA: Insufficient documentation

## 2017-03-08 DIAGNOSIS — Z7952 Long term (current) use of systemic steroids: Secondary | ICD-10-CM | POA: Diagnosis not present

## 2017-03-08 DIAGNOSIS — E785 Hyperlipidemia, unspecified: Secondary | ICD-10-CM | POA: Insufficient documentation

## 2017-03-08 DIAGNOSIS — I25110 Atherosclerotic heart disease of native coronary artery with unstable angina pectoris: Secondary | ICD-10-CM | POA: Diagnosis not present

## 2017-03-08 DIAGNOSIS — Z8673 Personal history of transient ischemic attack (TIA), and cerebral infarction without residual deficits: Secondary | ICD-10-CM | POA: Diagnosis not present

## 2017-03-08 DIAGNOSIS — Z7902 Long term (current) use of antithrombotics/antiplatelets: Secondary | ICD-10-CM | POA: Diagnosis not present

## 2017-03-08 DIAGNOSIS — K219 Gastro-esophageal reflux disease without esophagitis: Secondary | ICD-10-CM | POA: Insufficient documentation

## 2017-03-08 DIAGNOSIS — Z794 Long term (current) use of insulin: Secondary | ICD-10-CM | POA: Diagnosis not present

## 2017-03-08 DIAGNOSIS — F329 Major depressive disorder, single episode, unspecified: Secondary | ICD-10-CM | POA: Insufficient documentation

## 2017-03-08 DIAGNOSIS — I10 Essential (primary) hypertension: Secondary | ICD-10-CM | POA: Insufficient documentation

## 2017-03-08 DIAGNOSIS — G35 Multiple sclerosis: Secondary | ICD-10-CM | POA: Diagnosis not present

## 2017-03-08 DIAGNOSIS — I2584 Coronary atherosclerosis due to calcified coronary lesion: Secondary | ICD-10-CM | POA: Diagnosis not present

## 2017-03-08 DIAGNOSIS — F419 Anxiety disorder, unspecified: Secondary | ICD-10-CM | POA: Insufficient documentation

## 2017-03-08 HISTORY — PX: LEFT HEART CATH AND CORONARY ANGIOGRAPHY: CATH118249

## 2017-03-08 HISTORY — PX: INTRAVASCULAR PRESSURE WIRE/FFR STUDY: CATH118243

## 2017-03-08 LAB — GLUCOSE, CAPILLARY
GLUCOSE-CAPILLARY: 250 mg/dL — AB (ref 65–99)
GLUCOSE-CAPILLARY: 252 mg/dL — AB (ref 65–99)

## 2017-03-08 LAB — POTASSIUM: Potassium: 3.5 mmol/L (ref 3.5–5.1)

## 2017-03-08 SURGERY — LEFT HEART CATH AND CORONARY ANGIOGRAPHY
Anesthesia: LOCAL

## 2017-03-08 MED ORDER — VERAPAMIL HCL 2.5 MG/ML IV SOLN
INTRAVENOUS | Status: DC | PRN
  Administered 2017-03-08: 10 mL via INTRA_ARTERIAL

## 2017-03-08 MED ORDER — MIDAZOLAM HCL 2 MG/2ML IJ SOLN
INTRAMUSCULAR | Status: AC
  Filled 2017-03-08: qty 2

## 2017-03-08 MED ORDER — HEPARIN (PORCINE) IN NACL 2-0.9 UNIT/ML-% IJ SOLN
INTRAMUSCULAR | Status: AC
Start: 2017-03-08 — End: 2017-03-08
  Filled 2017-03-08: qty 1000

## 2017-03-08 MED ORDER — ASPIRIN 81 MG PO CHEW
81.0000 mg | CHEWABLE_TABLET | ORAL | Status: AC
  Administered 2017-03-08: 81 mg via ORAL

## 2017-03-08 MED ORDER — HYDRALAZINE HCL 20 MG/ML IJ SOLN: 5.0000 mg | INTRAMUSCULAR | Status: DC | PRN

## 2017-03-08 MED ORDER — HEPARIN SODIUM (PORCINE) 1000 UNIT/ML IJ SOLN
INTRAMUSCULAR | Status: AC
  Filled 2017-03-08: qty 1

## 2017-03-08 MED ORDER — HEPARIN (PORCINE) IN NACL 2-0.9 UNIT/ML-% IJ SOLN
INTRAMUSCULAR | Status: AC | PRN
  Administered 2017-03-08: 1000 mL

## 2017-03-08 MED ORDER — SODIUM CHLORIDE 0.9 % WEIGHT BASED INFUSION: 1.0000 mL/kg/h | INTRAVENOUS | Status: DC

## 2017-03-08 MED ORDER — FAMOTIDINE IN NACL 20-0.9 MG/50ML-% IV SOLN
20.0000 mg | Freq: Once | INTRAVENOUS | Status: AC
  Administered 2017-03-08: 20 mg via INTRAVENOUS

## 2017-03-08 MED ORDER — FAMOTIDINE IN NACL 20-0.9 MG/50ML-% IV SOLN
INTRAVENOUS | Status: AC
  Filled 2017-03-08: qty 50

## 2017-03-08 MED ORDER — SODIUM CHLORIDE 0.9% FLUSH: 3.0000 mL | INTRAVENOUS | Status: DC | PRN

## 2017-03-08 MED ORDER — METFORMIN HCL ER 500 MG PO TB24: 500.0000 mg | ORAL_TABLET | Freq: Every day | ORAL | 0 refills | Status: DC

## 2017-03-08 MED ORDER — IOPAMIDOL (ISOVUE-370) INJECTION 76%
INTRAVENOUS | Status: DC | PRN
  Administered 2017-03-08: 90 mL via INTRA_ARTERIAL

## 2017-03-08 MED ORDER — FENTANYL CITRATE (PF) 100 MCG/2ML IJ SOLN
INTRAMUSCULAR | Status: DC | PRN
Start: 2017-03-08 — End: 2017-03-08
  Administered 2017-03-08: 50 ug via INTRAVENOUS

## 2017-03-08 MED ORDER — SODIUM CHLORIDE 0.9% FLUSH: 3.0000 mL | Freq: Two times a day (BID) | INTRAVENOUS | Status: DC

## 2017-03-08 MED ORDER — NITROGLYCERIN 1 MG/10 ML FOR IR/CATH LAB
INTRA_ARTERIAL | Status: AC
  Filled 2017-03-08: qty 10

## 2017-03-08 MED ORDER — LIDOCAINE HCL (PF) 1 % IJ SOLN
INTRAMUSCULAR | Status: DC | PRN
  Administered 2017-03-08: 2 mL

## 2017-03-08 MED ORDER — SODIUM CHLORIDE 0.9 % IV SOLN: INTRAVENOUS | Status: DC

## 2017-03-08 MED ORDER — ASPIRIN 81 MG PO CHEW
CHEWABLE_TABLET | ORAL | Status: AC
  Filled 2017-03-08: qty 1

## 2017-03-08 MED ORDER — MIDAZOLAM HCL 2 MG/2ML IJ SOLN
INTRAMUSCULAR | Status: DC | PRN
  Administered 2017-03-08: 1 mg via INTRAVENOUS

## 2017-03-08 MED ORDER — SODIUM CHLORIDE 0.9 % WEIGHT BASED INFUSION
3.0000 mL/kg/h | INTRAVENOUS | Status: AC
  Administered 2017-03-08: 3 mL/kg/h via INTRAVENOUS

## 2017-03-08 MED ORDER — FENTANYL CITRATE (PF) 100 MCG/2ML IJ SOLN
INTRAMUSCULAR | Status: AC
  Filled 2017-03-08: qty 2

## 2017-03-08 MED ORDER — LIDOCAINE HCL (PF) 1 % IJ SOLN
INTRAMUSCULAR | Status: AC
Start: 2017-03-08 — End: 2017-03-08
  Filled 2017-03-08: qty 30

## 2017-03-08 MED ORDER — SODIUM CHLORIDE 0.9 % IV SOLN: 250.0000 mL | INTRAVENOUS | Status: DC | PRN

## 2017-03-08 MED ORDER — ADENOSINE 12 MG/4ML IV SOLN
INTRAVENOUS | Status: AC
  Filled 2017-03-08: qty 16

## 2017-03-08 MED ORDER — HEPARIN SODIUM (PORCINE) 1000 UNIT/ML IJ SOLN
INTRAMUSCULAR | Status: DC | PRN
  Administered 2017-03-08: 4000 [IU] via INTRAVENOUS
  Administered 2017-03-08: 3000 [IU] via INTRAVENOUS

## 2017-03-08 MED ORDER — NITROGLYCERIN 1 MG/10 ML FOR IR/CATH LAB
INTRA_ARTERIAL | Status: DC | PRN
  Administered 2017-03-08: 200 ug via INTRACORONARY

## 2017-03-08 MED ORDER — ADENOSINE (DIAGNOSTIC) 140MCG/KG/MIN
INTRAVENOUS | Status: DC | PRN
  Administered 2017-03-08: 140 ug/kg/min via INTRAVENOUS

## 2017-03-08 MED ORDER — VERAPAMIL HCL 2.5 MG/ML IV SOLN
INTRAVENOUS | Status: AC
  Filled 2017-03-08: qty 2

## 2017-03-08 SURGICAL SUPPLY — 13 items
CATH 5FR JL3.5 JR4 ANG PIG MP (CATHETERS) ×2 IMPLANT
CATH LAUNCHER 6FR EBU 3 (CATHETERS) ×2 IMPLANT
DEVICE RAD COMP TR BAND LRG (VASCULAR PRODUCTS) ×2 IMPLANT
GLIDESHEATH SLEND SS 6F .021 (SHEATH) ×2 IMPLANT
GUIDEWIRE INQWIRE 1.5J.035X260 (WIRE) ×1 IMPLANT
GUIDEWIRE PRESSURE COMET II (WIRE) ×2 IMPLANT
INQWIRE 1.5J .035X260CM (WIRE) ×2
KIT ESSENTIALS PG (KITS) ×2 IMPLANT
KIT HEART LEFT (KITS) ×2 IMPLANT
PACK CARDIAC CATHETERIZATION (CUSTOM PROCEDURE TRAY) ×2 IMPLANT
SYR MEDRAD MARK V 150ML (SYRINGE) ×2 IMPLANT
TRANSDUCER W/STOPCOCK (MISCELLANEOUS) ×2 IMPLANT
TUBING CIL FLEX 10 FLL-RA (TUBING) ×2 IMPLANT

## 2017-03-08 NOTE — Discharge Instructions (Signed)
NO METFORMIN/GLUCOPHAGE FOR 2 DAYS ° ° °Radial Site Care °Refer to this sheet in the next few weeks. These instructions provide you with information about caring for yourself after your procedure. Your health care provider may also give you more specific instructions. Your treatment has been planned according to current medical practices, but problems sometimes occur. Call your health care provider if you have any problems or questions after your procedure. °What can I expect after the procedure? °After your procedure, it is typical to have the following: °· Bruising at the radial site that usually fades within 1-2 weeks. °· Blood collecting in the tissue (hematoma) that may be painful to the touch. It should usually decrease in size and tenderness within 1-2 weeks. ° °Follow these instructions at home: °· Take medicines only as directed by your health care provider. °· You may shower 24-48 hours after the procedure or as directed by your health care provider. Remove the bandage (dressing) and gently wash the site with plain soap and water. Pat the area dry with a clean towel. Do not rub the site, because this may cause bleeding. °· Do not take baths, swim, or use a hot tub until your health care provider approves. °· Check your insertion site every day for redness, swelling, or drainage. °· Do not apply powder or lotion to the site. °· Do not flex or bend the affected arm for 24 hours or as directed by your health care provider. °· Do not push or pull heavy objects with the affected arm for 24 hours or as directed by your health care provider. °· Do not lift over 10 lb (4.5 kg) for 5 days after your procedure or as directed by your health care provider. °· Ask your health care provider when it is okay to: °? Return to work or school. °? Resume usual physical activities or sports. °? Resume sexual activity. °· Do not drive home if you are discharged the same day as the procedure. Have someone else drive you. °· You  may drive 24 hours after the procedure unless otherwise instructed by your health care provider. °· Do not operate machinery or power tools for 24 hours after the procedure. °· If your procedure was done as an outpatient procedure, which means that you went home the same day as your procedure, a responsible adult should be with you for the first 24 hours after you arrive home. °· Keep all follow-up visits as directed by your health care provider. This is important. °Contact a health care provider if: °· You have a fever. °· You have chills. °· You have increased bleeding from the radial site. Hold pressure on the site. °Get help right away if: °· You have unusual pain at the radial site. °· You have redness, warmth, or swelling at the radial site. °· You have drainage (other than a small amount of blood on the dressing) from the radial site. °· The radial site is bleeding, and the bleeding does not stop after 30 minutes of holding steady pressure on the site. °· Your arm or hand becomes pale, cool, tingly, or numb. °This information is not intended to replace advice given to you by your health care provider. Make sure you discuss any questions you have with your health care provider. °Document Released: 10/13/2010 Document Revised: 02/16/2016 Document Reviewed: 03/29/2014 °Elsevier Interactive Patient Education © 2018 [employer] ° °

## 2017-03-08 NOTE — Interval H&P Note (Signed)
History and Physical Interval Note:  03/08/2017 7:08 AM  [name]  has presented today for cardiac catheterization, with the diagnosis of accelerating angina. The various methods of treatment have been discussed with the patient and family. After consideration of risks, benefits and other options for treatment, the patient has consented to  Procedure(s): Left Heart Cath and Coronary Angiography (N/A) as a surgical intervention .  The patient's history has been reviewed, patient examined, no change in status, stable for surgery.  I have reviewed the patient's chart and labs.  Questions were answered to the patient's satisfaction.    Cath Lab Visit (complete for each Cath Lab visit)  Clinical Evaluation Leading to the Procedure:   ACS: No.  Non-ACS:    Anginal Classification: CCS IV  Anti-ischemic medical therapy: Maximal Therapy (2 or more classes of medications)  Non-Invasive Test Results: Low-risk stress test findings: cardiac mortality <1%/year  Prior CABG: No previous CABG  Candise Crabtree

## 2017-03-08 NOTE — Brief Op Note (Signed)
Brief Cardiac Catheterization Note  Date: 03/08/2017 Time: 8:32 AM  PATIENT:  [name]  66 y.o. female  PRE-OPERATIVE DIAGNOSIS:  Accelerating angina  POST-OPERATIVE DIAGNOSIS:  Nonobstructive CAD  PROCEDURE:  Procedure(s): Left Heart Cath and Coronary Angiography (N/A) Intravascular Pressure Wire/FFR Study (N/A)  SURGEON:  Surgeon(s) and Role:    * Alexxa Sabet, MD - Primary  FINDINGS: 1. Non-obstructive CAD with patent LAD and LCx stents. FFR of 50% mid LAD is negative (0.91). 2. Normal to hyperdynamic LV contraction with mildly elevated LVEDP.  RECOMMENDATIONS: 1. Medical therapy.  Nelva Bush, MD [facility] Pager: [phone]

## 2017-03-08 NOTE — H&P (View-Only) (Signed)
Cardiology Office Note   Date:  03/06/2017   ID:  VERLE BRILLHART, DOB Apr 20, 1951, MRN [id number]  PCP:  Chipper Herb, MD  Cardiologist:   Minus Breeding, MD    Chief Complaint  Patient presents with  . Chest Pain      History of Present Illness: [name] is a 66 y.o. female who presents for evaluation of chest pain.   She has a known history of stents to the LAD and PTCA to diagonal in 2008.    She had non obstructive CAD in 2015.  She was admitted with slurred speech in 2016 and had a negative work up with an unremarkable echo and mild carotid plaque.  She was in the ED in January with dehydration.  She had atypical pain when I saw her in Feb.  POET (Plain Old Exercise Treadmill) was intermediate risk.  She had a follow-up perfusion study last month that suggest some possible mild ischemia in the basal inferolateral, mid inferolateral, apical inferior and apical lateral locations. She called the past couple of days and was having chest pain. This has been a constant discomfort. It is similar to previous. It is 7 out of 10 at its peak . She says they're occasional sharp discomforts and then also a dull discomfort. When she was short of breath on Sunday she took 3 nitroglycerin. She took 1 on Monday. She feels very fatigued. She feels dizzy when she stands up. She's had no arm or neck discomfort. She thinks this is similar to previous complaints.   Past Medical History:  Diagnosis Date  . Anxiety   . CAD (coronary artery disease)    DES to circumflex 02/2007, BMS to LAD and PTCA diagonal 03/2007  . Carotid artery plaque    Mild  . Cataract   . Depression   . Diverticulitis, colon   . Elevated d-dimer 01/08/2014  . Essential hypertension, benign   . GERD (gastroesophageal reflux disease)   . H/O hiatal hernia   . HLD (hyperlipidemia)   . IDDM (insulin dependent diabetes mellitus) ([place])   . Migraine    "used to have them really bad; don't have them anymore" (01/07/2014)  . MS  (multiple sclerosis) ([place])    Not confirmed  . PAT (paroxysmal atrial tachycardia) ([place])   . Prolapse of uterus   . PVD (peripheral vascular disease) ([place])   . TIA (transient ischemic attack) 1980's    Past Surgical History:  Procedure Laterality Date  . ABDOMINAL HYSTERECTOMY  1986   ovaries remain - prolaspe uterus   . APPENDECTOMY  ~ 1970  . BREAST BIOPSY Right 1980's  . BREAST LUMPECTOMY Right 1980's   Dr. Charlynne Pander   . CARDIAC CATHETERIZATION  01/07/2014  . CHOLECYSTECTOMY  ?1987  . COLONOSCOPY  2002   Dr. Anwar--> Severe diverticular changes in the region of the sigmoid and descending colon with scattered diverticular changes throughout the rest of the colon. No polyps, ulcerations. Despite numerous manipulations, the tip of the scope could not be tipped into the cecal area.  . COLONOSCOPY  01/10/2012   Procedure: COLONOSCOPY;  Surgeon: Daneil Dolin, MD;  Location: [facility];  Service: Endoscopy;  Laterality: N/A;  1:55  . CORONARY ANGIOPLASTY WITH STENT PLACEMENT  ~ 1997 X 2   "2 + 1"  . EYE SURGERY Bilateral 2014   cataract  . LEFT HEART CATHETERIZATION WITH CORONARY ANGIOGRAM N/A 01/07/2014   Procedure: LEFT HEART CATHETERIZATION WITH CORONARY ANGIOGRAM;  Surgeon: Larey Dresser, MD;  Location: [facility] C[facility] LAB;  Service: Cardiovascular;  Laterality: N/A;     Current Outpatient Prescriptions  Medication Sig Dispense Refill  . ALPRAZolam (XANAX) 0.5 MG tablet take 1  Tablet by mouth 3 times daily AS NEEDED FOR ANXIETY OR SLEEP 90 tablet 0  . amLODipine (NORVASC) 5 MG tablet Take 1 tablet (5 mg total) by mouth daily. 30 tablet 11  . aspirin EC 81 MG tablet Take 1 tablet (81 mg total) by mouth daily.    Marland Kitchen atenolol (TENORMIN) 50 MG tablet Take 1 tablet (50 mg total) by mouth 2 (two) times daily. 180 tablet 2  . citalopram (CELEXA) 20 MG tablet Take 1 Tablet by mouth once daily 90 tablet 0  . clopidogrel (PLAVIX) 75 MG tablet Take 1 tablet (75 mg total) by mouth daily. 90  tablet 3  . furosemide (LASIX) 40 MG tablet Take 1 tablet (40 mg total) by mouth daily. (Patient taking differently: Take 40 mg by mouth daily. Take one daily and one additional tablet if needed) 90 tablet 2  . insulin aspart (NOVOLOG FLEXPEN) 100 UNIT/ML FlexPen Inject 25 Units into the skin 3 (three) times daily with meals. (Patient taking differently: Inject 35 Units into the skin 3 (three) times daily with meals. ) 15 mL 1  . Insulin Degludec (TRESIBA FLEXTOUCH) 200 UNIT/ML SOPN Inject 50-60 Units into the skin daily. 3 pen 2  . isosorbide mononitrate (IMDUR) 120 MG 24 hr tablet Take 1 tablet (120 mg total) by mouth daily. 90 tablet 1  . lisinopril (PRINIVIL,ZESTRIL) 40 MG tablet Take 40 mg by mouth daily.  90 tablet 1  . meclizine (ANTIVERT) 25 MG tablet Take 1 tablet (25 mg total) by mouth 3 (three) times daily as needed for dizziness. 30 tablet 0  . metFORMIN (GLUCOPHAGE XR) 500 MG 24 hr tablet Take 1 tablet (500 mg total) by mouth daily with breakfast. 90 tablet 0  . nitroGLYCERIN (NITROSTAT) 0.4 MG SL tablet DISSOLVE 1 TAB UNDER TOUNGE FOR CHEST PAIN. MAY REPEAT EVERY 5 MINUTES FOR 3 DOSES. IF NO RELIEF CALL 911 OR GO TO ER 25 tablet 6  . potassium chloride SA (K-DUR,KLOR-CON) 20 MEQ tablet Take 40 mEq by mouth daily.     . rosuvastatin (CRESTOR) 40 MG tablet Take 1 tablet (40 mg total) by mouth daily. 90 tablet 3  . vitamin B-12 (CYANOCOBALAMIN) 1000 MCG tablet Take 1,000 mcg by mouth daily.    . Vitamin D, Ergocalciferol, (DRISDOL) 50000 units CAPS capsule TAKE ONE CAPSULE BY MOUTH EVERY SEVEN DAYS 12 capsule 0  . zolpidem (AMBIEN) 10 MG tablet TAKE ONE-HALF TO ONE TABLET BY MOUTH at bedtime AS NEEDED 90 tablet 0  . Continuous Blood Gluc Sensor (FREESTYLE LIBRE SENSOR SYSTEM) MISC Use to check BG - change every 10 days.  DX: uncontrolled type 2 DM, requiring insulin therapy E11.65, Z79.4 9 each 1  . predniSONE (DELTASONE) 20 MG tablet Take 3 tablets (60 mg total) by mouth as directed. Take  3 tablets the night before and 3 the AM of your cath 6 tablet 0   No current facility-administered medications for this visit.     Allergies:   Iohexol; Ticlid [ticlopidine hcl]; and Codeine    ROS:  Please see the history of present illness.   Otherwise, review of systems are positive for Dizziness, stress, fatigue.   All other systems are reviewed and negative.    PHYSICAL EXAM: VS:  BP (!) 160/80  Pulse 76   Ht 5\' 6"  (1.676 m)   Wt 182 lb (82.6 kg)   BMI 29.38 kg/m  , BMI Body mass index is 29.38 kg/m.  GENERAL:  Well appearing HEENT:  Pupils equal round and reactive, fundi not visualized, oral mucosa unremarkable NECK:  No jugular venous distention, waveform within normal limits, carotid upstroke brisk and symmetric, no bruits, no thyromegaly LYMPHATICS:  No cervical, inguinal adenopathy LUNGS:  Clear to auscultation bilaterally BACK:  No CVA tenderness CHEST:  Unremarkable HEART:  PMI not displaced or sustained,S1 and S2 within normal limits, no S3, no S4, no clicks, no rubs, no murmurs ABD:  Flat, positive bowel sounds normal in frequency in pitch, no bruits, no rebound, no guarding, no midline pulsatile mass, no hepatomegaly, no splenomegaly EXT:  2 plus pulses throughout, no edema, no cyanosis no clubbing SKIN:  No rashes no nodules NEURO:  Cranial nerves II through XII grossly intact, motor grossly intact throughout PSYCH:  Cognitively intact, oriented to person place and time   EKG:  EKG is ordered today. The ekg ordered 09/29/16 demonstrates sinus rhythm, rate 75,  QTC prolonged, diffuse nonspecific ST-T wave changes, left axis deviation, poor anterior R wave progression   Recent Labs: 12/18/2016: ALT 16; Hemoglobin 14.5; Platelets 188 01/16/2017: BUN 11; Creatinine, Ser 0.93; Potassium 3.6; Sodium 143    Lipid Panel    Component Value Date/Time   CHOL 116 12/18/2016 1101   CHOL 107 02/05/2013 1002   TRIG 159 (H) 12/18/2016 1101   TRIG 149 11/30/2015 0947    TRIG 143 02/05/2013 1002   HDL 35 (L) 12/18/2016 1101   HDL 35 (L) 11/30/2015 0947   HDL 35 (L) 02/05/2013 1002   CHOLHDL 3.3 12/18/2016 1101   CHOLHDL 4.8 09/02/2015 0608   VLDL 48 (H) 09/02/2015 0608   LDLCALC 49 12/18/2016 1101   LDLCALC 195 (H) 04/26/2014 1029   LDLCALC 43 02/05/2013 1002   LDLDIRECT 93 04/06/2015 1038      Wt Readings from Last 3 Encounters:  03/06/17 182 lb (82.6 kg)  02/06/17 181 lb (82.1 kg)  12/20/16 178 lb (80.7 kg)      Other studies Reviewed: Additional studies/ records that were reviewed today include:   Lexiscan Myoview. Review of the above records demonstrates:   As above   ASSESSMENT AND PLAN:  CAD:  Given the ongoing symptoms and the abnormal perfusion study we will need to do another cardiac cath. The patient understands that risks included but are not limited to stroke (1 in 1000), death (1 in 37), kidney failure [usually temporary] (1 in 500), bleeding (1 in 200), allergic reaction [possibly serious] (1 in 200).  The patient understands and agrees to proceed.   HTN:  Her blood pressure seems to be  Up.  I will increase the Norvasc.    CAROTID STENOSIS:  Mild.  No follow up indicated at this time.   HYPERLIPIDEMIA:     Her lipids are at target.  She will continue the meds as listed.   DM:  A1C 10 most recently.    She recently had her meds adjusted by Dr. Laurance Flatten  DYE ALLERGY:  Premedication will be ordered.   QT:  This is prolonged compared to previous but she has no symptoms consistent with QT syndrome.  She should avoid QT prolonging meds.    Current medicines are reviewed at length with the patient today.  The patient does not have concerns regarding medicines.  The following changes have  been made:  As above  Labs/ tests ordered today include:    Orders Placed This Encounter  Procedures  . CBC  . Basic metabolic panel  . Protime-INR  . EKG 12-Lead     Disposition:   FU with me in one year or sooner based on the results  above and symptoms.    Signed, Minus Breeding, MD  03/06/2017 4:06 PM    [facility]

## 2017-03-10 LAB — POCT ACTIVATED CLOTTING TIME: ACTIVATED CLOTTING TIME: 263 s

## 2017-03-11 ENCOUNTER — Telehealth: Payer: Self-pay | Admitting: Family Medicine

## 2017-03-11 ENCOUNTER — Encounter (HOSPITAL_COMMUNITY): Payer: Self-pay | Admitting: Internal Medicine

## 2017-03-11 MED FILL — Heparin Sodium (Porcine) Inj 1000 Unit/ML: INTRAMUSCULAR | Qty: 10 | Status: AC

## 2017-03-12 NOTE — Telephone Encounter (Signed)
No Tresiba samples or Novolog - had Basaglar and Humalog - patient is to take same as [place] and [employer].  Has appt this week

## 2017-03-13 ENCOUNTER — Ambulatory Visit (INDEPENDENT_AMBULATORY_CARE_PROVIDER_SITE_OTHER): Payer: PPO | Admitting: Pharmacist

## 2017-03-13 ENCOUNTER — Encounter: Payer: Self-pay | Admitting: Pharmacist

## 2017-03-13 VITALS — BP 134/80 | HR 74 | Ht 66.0 in | Wt 182.0 lb

## 2017-03-13 DIAGNOSIS — E876 Hypokalemia: Secondary | ICD-10-CM | POA: Diagnosis not present

## 2017-03-13 DIAGNOSIS — E1165 Type 2 diabetes mellitus with hyperglycemia: Secondary | ICD-10-CM

## 2017-03-13 DIAGNOSIS — Z794 Long term (current) use of insulin: Secondary | ICD-10-CM | POA: Diagnosis not present

## 2017-03-13 MED ORDER — EMPAGLIFLOZIN 10 MG PO TABS: 10.0000 mg | ORAL_TABLET | Freq: Every day | ORAL | 0 refills | Status: DC

## 2017-03-13 NOTE — Patient Instructions (Signed)
Increase Tresiba to 55 units once a day.   Continue Humalog / Novolog 35 units prior to each meal  Option: Switch to Relion N insulin (replaces [place]) - inject 28 units twice a day Switch to Relion R insulin (replaces Novolog / Humalog) - inject 35 units prior to meals.  You can get Relion insulin at [facility] without a prescription for $25 per bottle. You would need about 2 bottle of N and 3 bottle for R (total $125 per month)

## 2017-03-13 NOTE — Progress Notes (Signed)
Subjective:    [name] is a 66 y.o. female who presents for a follow up evaluation of Type 2 diabetes mellitus, requiring insulin therapy.   Current symptoms/problems include hyperglycemia and have been unchanged. Symptoms have been present for 3 months.  Current diabetic medications include merformin XR 552m qd - patient has had difficulty toleratng higher doses in past.  Tresiba 50 units SQ qd; Novolog 35 unit tid.  Patient states she was out of insulin for 1 day recently .  Patient is currently in Medicare coverage gap.  She has been receiving samples from our office but we do not always have enough for her.  She tried Invokana in past but patient stopped due to concerns about side effects.  When taking Invokana A1c was down to 8.8%.  She also tried Trulicity but stopped because of cost.  Current monitoring regimen: home blood tests - 2 to 4 times daily  Pt has thought her glucometer was broken and has requested new Rx.  She took glucometer to pharmacy and it only needed a new battery. Home blood sugar records:  7 day avg = 300 14 day avg = 311 30 day avg = 298 Ranges from 159 to 416 Any episodes of hypoglycemia? no  Known diabetic complications: cardiovascular disease and cerebrovascular disease Cardiovascular risk factors: advanced age (older than 555for men, 612for women), diabetes mellitus, dyslipidemia, hypertension and sedentary lifestyle Eye exam current (within one year): no - patient states she has not has extra money to get eye exam Weight trend: stable Current diet: in general, a "healthy" diet   Current exercise: none Medication Compliance?  No   Is She on ACE inhibitor or angiotensin II receptor blocker?  Yes  lisinopril (Prinivil)    The following portions of the patient's history were reviewed and updated as appropriate: allergies, current medications, past family history, past medical history, past social history, past surgical history and problem list.     Objective:    BP 134/80   Pulse 74   Ht 5' 6"  (1.676 m)   Wt 182 lb (82.6 kg)   BMI 29.38 kg/m   Lab Review Glucose (mg/dL)  Date Value  03/06/2017 212 (H)  01/16/2017 139 (H)  12/19/2016 271 (H)   Glucose, Bld (mg/dL)  Date Value  09/29/2016 315 (H)  03/02/2016 225 (H)  02/29/2016 255 (H)   CO2 (mmol/L)  Date Value  03/06/2017 33 (H)  01/16/2017 32 (H)  12/19/2016 28   BUN (mg/dL)  Date Value  03/06/2017 12  01/16/2017 11  12/19/2016 12   Creat (mg/dL)  Date Value  01/05/2014 1.19 (H)  02/05/2013 0.96   Creatinine, Ser (mg/dL)  Date Value  03/06/2017 0.94  01/16/2017 0.93  12/19/2016 1.12 (H)      Assessment:    Diabetes Mellitus requiring insulin - under inadequate control.   Medication management - treatment of DM complicated by cost of med while in Medicare coverage gap   Plan:    1.  Rx changes:   Increase TTyler Aas/ long acting insulin to 55 units daily - gave #1 pen sample  Continue Humalog / Novolog 35 units prior to each meal - gave #1 pen sample  Discussed switching to Relion NPH insulin and Regular insulin which patient could get for $25 per bottle at [facility] (estimated monthly cost of about $125).  Patient to consider altnative and will let me know if she wants to switch.  We also applied for LIS but  website states pt not eligible.  Patient was given application for patient assistance from [employer].  Prior requirement for Medicare patient's is that they spend at leat $1000 out of pocket - patient has nto met this requirement yet.   Start jardiance 57m qd.if BMET WNL- gave #28 samples   2.  Education: Reviewed 'ABCs' of diabetes management (respective goals in parentheses):  A1C (<7), blood pressure (<130/80), and cholesterol (LDL <100). 3. CHO counting diet discussed.  Reviewed CHO amount in various foods and how to read nutrition labels.  Discussed recommended serving sizes.  4.  Recommend check BG 2-4 times a day 5.  Recommended  increase physical activity - goal is 150 minutes per week 6. Follow up: 6 weeks

## 2017-03-14 ENCOUNTER — Other Ambulatory Visit: Payer: Self-pay | Admitting: *Deleted

## 2017-03-14 ENCOUNTER — Telehealth: Payer: Self-pay | Admitting: Family Medicine

## 2017-03-14 DIAGNOSIS — E875 Hyperkalemia: Secondary | ICD-10-CM

## 2017-03-14 LAB — BMP8+EGFR
BUN/Creatinine Ratio: 12 (ref 12–28)
BUN: 10 mg/dL (ref 8–27)
CALCIUM: 9.3 mg/dL (ref 8.7–10.3)
CO2: 26 mmol/L (ref 20–29)
CREATININE: 0.86 mg/dL (ref 0.57–1.00)
Chloride: 99 mmol/L (ref 96–106)
GFR calc Af Amer: 80 mL/min/{1.73_m2} (ref >59)
GFR calc non Af Amer: 70 mL/min/{1.73_m2} (ref >59)
Glucose: 216 mg/dL — ABNORMAL HIGH (ref 65–99)
Potassium: 4 mmol/L (ref 3.5–5.2)
Sodium: 141 mmol/L (ref 134–144)

## 2017-03-14 NOTE — Telephone Encounter (Signed)
Patient called - she was not sure if she should continue metformin with the new addition of Jardiance 10mg  qd.   Confirmed that she should continue metformin 500mg  qam with food.

## 2017-03-15 ENCOUNTER — Telehealth: Payer: Self-pay | Admitting: *Deleted

## 2017-03-15 NOTE — Telephone Encounter (Signed)
Spoke with pt letting her know she in our recall for November and Dr Percival Spanish in fine seeing her then unless she need [name] before that appt time.

## 2017-03-15 NOTE — Telephone Encounter (Signed)
-----   Message from Damian Leavell, RN sent at 03/13/2017  3:06 PM EDT ----- Regarding: cath f/u Pt s/p cath, no intervention made.  Dr. Note said to "follow up one year or sooner depending" on the results of the cath.  I see she has a recall set for November.  Is she good to go?

## 2017-03-28 ENCOUNTER — Other Ambulatory Visit: Payer: PPO

## 2017-03-28 ENCOUNTER — Telehealth: Payer: Self-pay | Admitting: Family Medicine

## 2017-03-28 DIAGNOSIS — E875 Hyperkalemia: Secondary | ICD-10-CM

## 2017-03-28 NOTE — Telephone Encounter (Signed)
Tresiba samples in refrigerator, no Novolog samples available.  Patient aware and will pick up [place].

## 2017-03-29 ENCOUNTER — Other Ambulatory Visit: Payer: Self-pay

## 2017-03-29 ENCOUNTER — Other Ambulatory Visit: Payer: Self-pay | Admitting: Family Medicine

## 2017-03-29 LAB — BMP8+EGFR
BUN / CREAT RATIO: 9 — AB (ref 12–28)
BUN: 12 mg/dL (ref 8–27)
CALCIUM: 9.2 mg/dL (ref 8.7–10.3)
CO2: 26 mmol/L (ref 20–29)
CREATININE: 1.34 mg/dL — AB (ref 0.57–1.00)
Chloride: 94 mmol/L — ABNORMAL LOW (ref 96–106)
GFR calc non Af Amer: 41 mL/min/{1.73_m2} — ABNORMAL LOW (ref >59)
GFR, EST AFRICAN AMERICAN: 47 mL/min/{1.73_m2} — AB (ref >59)
Glucose: 270 mg/dL — ABNORMAL HIGH (ref 65–99)
Potassium: 3.3 mmol/L — ABNORMAL LOW (ref 3.5–5.2)
Sodium: 140 mmol/L (ref 134–144)

## 2017-04-03 ENCOUNTER — Telehealth: Payer: Self-pay | Admitting: Family Medicine

## 2017-04-03 ENCOUNTER — Ambulatory Visit: Payer: PPO | Admitting: Cardiology

## 2017-04-03 ENCOUNTER — Other Ambulatory Visit: Payer: Self-pay | Admitting: Family Medicine

## 2017-04-03 MED ORDER — INSULIN GLARGINE 300 UNIT/ML ~~LOC~~ SOPN: 50.0000 [IU] | PEN_INJECTOR | Freq: Every day | SUBCUTANEOUS | 0 refills | Status: DC

## 2017-04-03 MED ORDER — INSULIN LISPRO 200 UNIT/ML ~~LOC~~ SOPN: 25.0000 [IU] | PEN_INJECTOR | Freq: Three times a day (TID) | SUBCUTANEOUS | 0 refills | Status: DC

## 2017-04-03 NOTE — Telephone Encounter (Signed)
Left samples of TOujeo and Humalog for patient

## 2017-04-04 NOTE — Telephone Encounter (Signed)
Please call in alpraazolam with 1 refills

## 2017-04-04 NOTE — Telephone Encounter (Signed)
Last seen 03/13/17  Tammy  [facility] PCP  If approved route to nurse to call into [facility]

## 2017-04-04 NOTE — Telephone Encounter (Signed)
Rx called in 

## 2017-04-15 ENCOUNTER — Telehealth: Payer: Self-pay | Admitting: Family Medicine

## 2017-04-15 NOTE — Telephone Encounter (Signed)
Samples ready.

## 2017-04-18 ENCOUNTER — Telehealth: Payer: Self-pay | Admitting: Family Medicine

## 2017-04-18 DIAGNOSIS — Z1239 Encounter for other screening for malignant neoplasm of breast: Secondary | ICD-10-CM

## 2017-04-18 NOTE — Telephone Encounter (Signed)
Please address  Need order

## 2017-04-19 NOTE — Telephone Encounter (Signed)
ordered

## 2017-04-22 ENCOUNTER — Telehealth: Payer: Self-pay | Admitting: Family Medicine

## 2017-04-22 ENCOUNTER — Encounter: Payer: PPO | Admitting: *Deleted

## 2017-04-22 ENCOUNTER — Ambulatory Visit (INDEPENDENT_AMBULATORY_CARE_PROVIDER_SITE_OTHER): Payer: PPO | Admitting: *Deleted

## 2017-04-22 DIAGNOSIS — E876 Hypokalemia: Secondary | ICD-10-CM

## 2017-04-22 NOTE — Progress Notes (Signed)
Pt here for Tdap Pt had Tdap in 2012 No vaccine given Immunization report printed and given to pt

## 2017-04-22 NOTE — Telephone Encounter (Signed)
Pt states she was already given these

## 2017-04-23 LAB — BMP8+EGFR
BUN/Creatinine Ratio: 9 — ABNORMAL LOW (ref 12–28)
BUN: 7 mg/dL — AB (ref 8–27)
CO2: 24 mmol/L (ref 20–29)
CREATININE: 0.76 mg/dL (ref 0.57–1.00)
Calcium: 9.7 mg/dL (ref 8.7–10.3)
Chloride: 98 mmol/L (ref 96–106)
GFR calc non Af Amer: 81 mL/min/{1.73_m2} (ref >59)
GFR, EST AFRICAN AMERICAN: 93 mL/min/{1.73_m2} (ref >59)
GLUCOSE: 223 mg/dL — AB (ref 65–99)
Potassium: 4.3 mmol/L (ref 3.5–5.2)
SODIUM: 140 mmol/L (ref 134–144)

## 2017-04-30 ENCOUNTER — Telehealth: Payer: Self-pay | Admitting: Pharmacist

## 2017-04-30 NOTE — Telephone Encounter (Signed)
No [place] but left #1 Tresiba for pt - same directions 50 to 60 units daily Also left #1 Humalog u 200 pen

## 2017-05-02 ENCOUNTER — Ambulatory Visit: Payer: PPO | Admitting: Family Medicine

## 2017-05-14 ENCOUNTER — Other Ambulatory Visit: Payer: Self-pay | Admitting: Family Medicine

## 2017-05-14 ENCOUNTER — Telehealth: Payer: Self-pay | Admitting: Family Medicine

## 2017-05-14 NOTE — Telephone Encounter (Signed)
Pt aware - samples aware

## 2017-05-22 ENCOUNTER — Other Ambulatory Visit: Payer: Self-pay | Admitting: Family Medicine

## 2017-05-22 ENCOUNTER — Other Ambulatory Visit: Payer: Self-pay | Admitting: Cardiology

## 2017-05-22 NOTE — Telephone Encounter (Signed)
REFILL 

## 2017-05-29 ENCOUNTER — Other Ambulatory Visit: Payer: Self-pay | Admitting: Nurse Practitioner

## 2017-06-03 ENCOUNTER — Telehealth: Payer: Self-pay | Admitting: Family Medicine

## 2017-06-03 NOTE — Telephone Encounter (Signed)
Pt aware.

## 2017-06-12 ENCOUNTER — Other Ambulatory Visit: Payer: Self-pay | Admitting: Family Medicine

## 2017-06-13 ENCOUNTER — Telehealth: Payer: Self-pay | Admitting: Family Medicine

## 2017-06-13 NOTE — Telephone Encounter (Signed)
Samples labeled and in the fridge in the lab, left message on pt voicemail stating to come by office and pick them up.

## 2017-06-19 ENCOUNTER — Ambulatory Visit (INDEPENDENT_AMBULATORY_CARE_PROVIDER_SITE_OTHER): Payer: PPO

## 2017-06-19 DIAGNOSIS — Z23 Encounter for immunization: Secondary | ICD-10-CM | POA: Diagnosis not present

## 2017-07-03 ENCOUNTER — Other Ambulatory Visit: Payer: Self-pay | Admitting: Family Medicine

## 2017-07-03 NOTE — Telephone Encounter (Signed)
[facility] PCP  Last seen 03/13/17   If approved route to nurse to call into [facility]

## 2017-07-03 NOTE — Telephone Encounter (Signed)
Rx called in to pharmacy. 

## 2017-07-03 NOTE — Telephone Encounter (Signed)
Please call in ambien with 1 refills 

## 2017-07-23 ENCOUNTER — Telehealth: Payer: Self-pay | Admitting: Family Medicine

## 2017-07-23 MED ORDER — BLOOD GLUCOSE MONITOR SYSTEM W/DEVICE KIT: PACK | 11 refills | Status: DC

## 2017-07-23 NOTE — Telephone Encounter (Signed)
Prescription sent in, patient aware. 

## 2017-08-12 ENCOUNTER — Telehealth: Payer: Self-pay | Admitting: Family Medicine

## 2017-08-12 NOTE — Telephone Encounter (Signed)
Patient aware that samples are ready for pick up 

## 2017-09-19 ENCOUNTER — Telehealth: Payer: Self-pay | Admitting: Family Medicine

## 2017-09-19 NOTE — Telephone Encounter (Signed)
Patient aware no samples  

## 2017-09-21 ENCOUNTER — Emergency Department (HOSPITAL_COMMUNITY): Payer: PPO

## 2017-09-21 ENCOUNTER — Observation Stay (HOSPITAL_COMMUNITY)
Admission: EM | Admit: 2017-09-21 | Discharge: 2017-09-22 | Disposition: A | Discharge status: Home / Self Care | Payer: PPO | Attending: Internal Medicine | Admitting: Internal Medicine

## 2017-09-21 ENCOUNTER — Other Ambulatory Visit: Payer: Self-pay

## 2017-09-21 ENCOUNTER — Encounter (HOSPITAL_COMMUNITY): Payer: Self-pay

## 2017-09-21 DIAGNOSIS — E785 Hyperlipidemia, unspecified: Secondary | ICD-10-CM | POA: Insufficient documentation

## 2017-09-21 DIAGNOSIS — F411 Generalized anxiety disorder: Secondary | ICD-10-CM | POA: Diagnosis present

## 2017-09-21 DIAGNOSIS — E876 Hypokalemia: Secondary | ICD-10-CM | POA: Insufficient documentation

## 2017-09-21 DIAGNOSIS — G459 Transient cerebral ischemic attack, unspecified: Secondary | ICD-10-CM | POA: Diagnosis not present

## 2017-09-21 DIAGNOSIS — Z8673 Personal history of transient ischemic attack (TIA), and cerebral infarction without residual deficits: Secondary | ICD-10-CM | POA: Diagnosis present

## 2017-09-21 DIAGNOSIS — Z79899 Other long term (current) drug therapy: Secondary | ICD-10-CM | POA: Diagnosis not present

## 2017-09-21 DIAGNOSIS — Z7982 Long term (current) use of aspirin: Secondary | ICD-10-CM | POA: Diagnosis not present

## 2017-09-21 DIAGNOSIS — E1165 Type 2 diabetes mellitus with hyperglycemia: Secondary | ICD-10-CM | POA: Diagnosis present

## 2017-09-21 DIAGNOSIS — R479 Unspecified speech disturbances: Secondary | ICD-10-CM | POA: Diagnosis not present

## 2017-09-21 DIAGNOSIS — R4781 Slurred speech: Secondary | ICD-10-CM | POA: Diagnosis not present

## 2017-09-21 DIAGNOSIS — I10 Essential (primary) hypertension: Secondary | ICD-10-CM | POA: Diagnosis not present

## 2017-09-21 DIAGNOSIS — E1169 Type 2 diabetes mellitus with other specified complication: Secondary | ICD-10-CM | POA: Insufficient documentation

## 2017-09-21 DIAGNOSIS — R4789 Other speech disturbances: Secondary | ICD-10-CM | POA: Diagnosis present

## 2017-09-21 DIAGNOSIS — Z7902 Long term (current) use of antithrombotics/antiplatelets: Secondary | ICD-10-CM | POA: Insufficient documentation

## 2017-09-21 DIAGNOSIS — I2510 Atherosclerotic heart disease of native coronary artery without angina pectoris: Secondary | ICD-10-CM | POA: Diagnosis not present

## 2017-09-21 DIAGNOSIS — I25110 Atherosclerotic heart disease of native coronary artery with unstable angina pectoris: Secondary | ICD-10-CM | POA: Diagnosis present

## 2017-09-21 DIAGNOSIS — R531 Weakness: Secondary | ICD-10-CM | POA: Diagnosis not present

## 2017-09-21 DIAGNOSIS — F418 Other specified anxiety disorders: Secondary | ICD-10-CM | POA: Diagnosis present

## 2017-09-21 DIAGNOSIS — R2689 Other abnormalities of gait and mobility: Secondary | ICD-10-CM | POA: Insufficient documentation

## 2017-09-21 DIAGNOSIS — Z794 Long term (current) use of insulin: Secondary | ICD-10-CM | POA: Insufficient documentation

## 2017-09-21 DIAGNOSIS — I25119 Atherosclerotic heart disease of native coronary artery with unspecified angina pectoris: Secondary | ICD-10-CM | POA: Diagnosis present

## 2017-09-21 DIAGNOSIS — I6789 Other cerebrovascular disease: Secondary | ICD-10-CM | POA: Diagnosis not present

## 2017-09-21 LAB — DIFFERENTIAL
BASOS ABS: 0 10*3/uL (ref 0.0–0.1)
BASOS PCT: 0 %
Eosinophils Absolute: 0.1 10*3/uL (ref 0.0–0.7)
Eosinophils Relative: 2 %
Lymphocytes Relative: 43 %
Lymphs Abs: 3.4 10*3/uL (ref 0.7–4.0)
Monocytes Absolute: 0.7 10*3/uL (ref 0.1–1.0)
Monocytes Relative: 9 %
NEUTROS ABS: 3.7 10*3/uL (ref 1.7–7.7)
Neutrophils Relative %: 46 %

## 2017-09-21 LAB — CBC
HEMATOCRIT: 38 % (ref 36.0–46.0)
Hemoglobin: 13.3 g/dL (ref 12.0–15.0)
MCH: 29.8 pg (ref 26.0–34.0)
MCHC: 35 g/dL (ref 30.0–36.0)
MCV: 85.2 fL (ref 78.0–100.0)
PLATELETS: 179 10*3/uL (ref 150–400)
RBC: 4.46 MIL/uL (ref 3.87–5.11)
RDW: 13 % (ref 11.5–15.5)
WBC: 8 10*3/uL (ref 4.0–10.5)

## 2017-09-21 LAB — COMPREHENSIVE METABOLIC PANEL
ALT: 15 U/L (ref 14–54)
AST: 22 U/L (ref 15–41)
Albumin: 4.1 g/dL (ref 3.5–5.0)
Alkaline Phosphatase: 54 U/L (ref 38–126)
Anion gap: 14 (ref 5–15)
BUN: 16 mg/dL (ref 6–20)
CHLORIDE: 101 mmol/L (ref 101–111)
CO2: 24 mmol/L (ref 22–32)
CREATININE: 1.18 mg/dL — AB (ref 0.44–1.00)
Calcium: 9.6 mg/dL (ref 8.9–10.3)
GFR calc Af Amer: 54 mL/min — ABNORMAL LOW (ref >60)
GFR calc non Af Amer: 46 mL/min — ABNORMAL LOW (ref >60)
Glucose, Bld: 81 mg/dL (ref 65–99)
Potassium: 3.3 mmol/L — ABNORMAL LOW (ref 3.5–5.1)
SODIUM: 139 mmol/L (ref 135–145)
Total Bilirubin: 0.4 mg/dL (ref 0.3–1.2)
Total Protein: 7.8 g/dL (ref 6.5–8.1)

## 2017-09-21 LAB — PROTIME-INR
INR: 0.91
PROTHROMBIN TIME: 12.2 s (ref 11.4–15.2)

## 2017-09-21 LAB — ETHANOL

## 2017-09-21 LAB — I-STAT CHEM 8, ED
BUN: 16 mg/dL (ref 6–20)
CHLORIDE: 99 mmol/L — AB (ref 101–111)
Calcium, Ion: 1.22 mmol/L (ref 1.15–1.40)
Creatinine, Ser: 1.2 mg/dL — ABNORMAL HIGH (ref 0.44–1.00)
GLUCOSE: 81 mg/dL (ref 65–99)
HCT: 37 % (ref 36.0–46.0)
Hemoglobin: 12.6 g/dL (ref 12.0–15.0)
POTASSIUM: 3.2 mmol/L — AB (ref 3.5–5.1)
Sodium: 140 mmol/L (ref 135–145)
TCO2: 26 mmol/L (ref 22–32)

## 2017-09-21 LAB — APTT: APTT: 28 s (ref 24–36)

## 2017-09-21 LAB — I-STAT TROPONIN, ED: Troponin i, poc: 0 ng/mL (ref 0.00–0.08)

## 2017-09-21 MED ORDER — MECLIZINE HCL 12.5 MG PO TABS: 25.0000 mg | ORAL_TABLET | Freq: Three times a day (TID) | ORAL | Status: DC | PRN

## 2017-09-21 MED ORDER — SODIUM CHLORIDE 0.9 % IV SOLN
INTRAVENOUS | Status: AC
  Administered 2017-09-22: 06:00:00 via INTRAVENOUS

## 2017-09-21 MED ORDER — INSULIN GLARGINE 100 UNIT/ML ~~LOC~~ SOLN
50.0000 [IU] | Freq: Every day | SUBCUTANEOUS | Status: DC
  Filled 2017-09-21: qty 0.5

## 2017-09-21 MED ORDER — ISOSORBIDE MONONITRATE ER 60 MG PO TB24
120.0000 mg | ORAL_TABLET | Freq: Every day | ORAL | Status: DC
  Administered 2017-09-22: 120 mg via ORAL
  Filled 2017-09-21: qty 2

## 2017-09-21 MED ORDER — ASPIRIN 300 MG RE SUPP: 300.0000 mg | Freq: Every day | RECTAL | Status: DC

## 2017-09-21 MED ORDER — CITALOPRAM HYDROBROMIDE 20 MG PO TABS
20.0000 mg | ORAL_TABLET | Freq: Every day | ORAL | Status: DC
  Administered 2017-09-22: 20 mg via ORAL
  Filled 2017-09-21: qty 1
  Filled 2017-09-21: qty 2

## 2017-09-21 MED ORDER — ZOLPIDEM TARTRATE 5 MG PO TABS
5.0000 mg | ORAL_TABLET | Freq: Every evening | ORAL | Status: DC | PRN
Start: 2017-09-21 — End: 2017-09-22

## 2017-09-21 MED ORDER — POTASSIUM CHLORIDE CRYS ER 20 MEQ PO TBCR
40.0000 meq | EXTENDED_RELEASE_TABLET | Freq: Every day | ORAL | Status: DC
  Administered 2017-09-22: 40 meq via ORAL
  Filled 2017-09-21: qty 2

## 2017-09-21 MED ORDER — INSULIN GLARGINE 100 UNIT/ML ~~LOC~~ SOLN: 40.0000 [IU] | Freq: Every day | SUBCUTANEOUS | Status: DC

## 2017-09-21 MED ORDER — ACETAMINOPHEN 160 MG/5ML PO SOLN: 650.0000 mg | ORAL | Status: DC | PRN

## 2017-09-21 MED ORDER — INSULIN ASPART 100 UNIT/ML ~~LOC~~ SOLN: 0.0000 [IU] | Freq: Every day | SUBCUTANEOUS | Status: DC

## 2017-09-21 MED ORDER — ACETAMINOPHEN 325 MG PO TABS: 650.0000 mg | ORAL_TABLET | ORAL | Status: DC | PRN

## 2017-09-21 MED ORDER — ASPIRIN EC 81 MG PO TBEC: 81.0000 mg | DELAYED_RELEASE_TABLET | Freq: Every day | ORAL | Status: DC

## 2017-09-21 MED ORDER — ALPRAZOLAM 0.5 MG PO TABS: 0.5000 mg | ORAL_TABLET | Freq: Three times a day (TID) | ORAL | Status: DC | PRN

## 2017-09-21 MED ORDER — ACETAMINOPHEN 650 MG RE SUPP: 650.0000 mg | RECTAL | Status: DC | PRN

## 2017-09-21 MED ORDER — INSULIN ASPART 100 UNIT/ML ~~LOC~~ SOLN
0.0000 [IU] | Freq: Three times a day (TID) | SUBCUTANEOUS | Status: DC
  Administered 2017-09-22: 15 [IU] via SUBCUTANEOUS
  Administered 2017-09-22: 8 [IU] via SUBCUTANEOUS
  Filled 2017-09-21: qty 1

## 2017-09-21 MED ORDER — CLOPIDOGREL BISULFATE 75 MG PO TABS
75.0000 mg | ORAL_TABLET | Freq: Every day | ORAL | Status: DC
  Administered 2017-09-22: 75 mg via ORAL
  Filled 2017-09-21: qty 1

## 2017-09-21 MED ORDER — STROKE: EARLY STAGES OF RECOVERY BOOK
Freq: Once | Status: DC
  Filled 2017-09-21: qty 1

## 2017-09-21 MED ORDER — SENNOSIDES-DOCUSATE SODIUM 8.6-50 MG PO TABS: 1.0000 | ORAL_TABLET | Freq: Every evening | ORAL | Status: DC | PRN

## 2017-09-21 MED ORDER — ENOXAPARIN SODIUM 40 MG/0.4ML ~~LOC~~ SOLN
40.0000 mg | SUBCUTANEOUS | Status: DC
  Administered 2017-09-22: 40 mg via SUBCUTANEOUS
  Filled 2017-09-21: qty 0.4

## 2017-09-21 MED ORDER — ROSUVASTATIN CALCIUM 40 MG PO TABS
40.0000 mg | ORAL_TABLET | Freq: Every day | ORAL | Status: DC
  Filled 2017-09-21: qty 1

## 2017-09-21 MED ORDER — ASPIRIN 325 MG PO TABS
325.0000 mg | ORAL_TABLET | Freq: Every day | ORAL | Status: DC
  Administered 2017-09-22: 325 mg via ORAL
  Filled 2017-09-21: qty 1

## 2017-09-21 MED ORDER — INSULIN ASPART 100 UNIT/ML ~~LOC~~ SOLN
4.0000 [IU] | Freq: Three times a day (TID) | SUBCUTANEOUS | Status: DC
  Administered 2017-09-22: 4 [IU] via SUBCUTANEOUS

## 2017-09-21 MED ORDER — VITAMIN B-12 1000 MCG PO TABS
1000.0000 ug | ORAL_TABLET | Freq: Every day | ORAL | Status: DC
  Administered 2017-09-22: 1000 ug via ORAL
  Filled 2017-09-21: qty 1

## 2017-09-21 NOTE — ED Provider Notes (Signed)
[facility] EMERGENCY DEPARTMENT Provider Note   CSN: [id number] Arrival date & time: 09/21/17  2126     History   Chief Complaint Chief Complaint  Patient presents with  . Weakness    HPI [name] is a 66 y.o. female.  HPI Patient brought in by EMS for possible stroke.  States she was having an argument of her family members and began to have tightening on her left jaw.  Then began to have difficulty speaking and numbness on her left side.  States she has had episodes like this before with TIAs.  States she was unable to get up.  States she was unable to walk because she was unsteady.  Denies headache.  States her speech was slurred.  States she had episodes 2 years ago that were similar.  States she still feels weak but is doing somewhat better now.  Last normal was around 5 hours ago.  Patient also took 2 Xanax since the episode started. Past Medical History:  Diagnosis Date  . Anxiety   . CAD (coronary artery disease)    DES to circumflex 02/2007, BMS to LAD and PTCA diagonal 03/2007  . Carotid artery plaque    Mild  . Cataract   . Depression   . Diverticulitis, colon   . Elevated d-dimer 01/08/2014  . Essential hypertension, benign   . GERD (gastroesophageal reflux disease)   . H/O hiatal hernia   . HLD (hyperlipidemia)   . IDDM (insulin dependent diabetes mellitus) ([place])   . Migraine    "used to have them really bad; don't have them anymore" (01/07/2014)  . MS (multiple sclerosis) ([place])    Not confirmed  . PAT (paroxysmal atrial tachycardia) ([place])   . Prolapse of uterus   . PVD (peripheral vascular disease) ([place])   . TIA (transient ischemic attack) 1980's    Patient Active Problem List   Diagnosis Date Noted  . H/O prolonged Q-T interval on ECG 03/06/2017  . Abnormal cardiovascular stress test 03/06/2017  . Pre-op examination 03/06/2017  . Swelling of upper arm 07/04/2016  . Superficial bruising of arm, right, sequela 07/04/2016  . B12 deficiency  01/13/2016  . Facial droop 09/01/2015  . Stroke ([place]) 09/01/2015  . TIA (transient ischemic attack) 08/14/2014  . DM type 2 with diabetic dyslipidemia ([place]) 08/14/2014  . Cataract 02/08/2014  . Macular degeneration 02/08/2014  . Elevated d-dimer, neg VQ scan 01/08/2014  . Accelerating angina ([place]) 01/07/2014  . Chest pain 12/30/2013  . Generalized anxiety disorder 09/14/2013  . Claudication ([place]) 06/18/2013  . Diverticulosis of colon 01/01/2012  . Left lower quadrant pain 01/01/2012  . GERD (gastroesophageal reflux disease) 01/01/2012  . Constipation 01/01/2012  . Hypokalemia 08/24/2011  . Uncontrolled type 2 diabetes mellitus with insulin therapy ([place]) 08/23/2011  . Essential hypertension 08/23/2011  . PALPITATIONS 02/10/2010  . Hyperlipidemia 05/10/2009  . DEPRESSION 05/10/2009  . Coronary artery disease involving native coronary artery of native heart with angina pectoris ([place]) 08/26/2008  . Carotid artery disease ([place]) 08/26/2008    Past Surgical History:  Procedure Laterality Date  . ABDOMINAL HYSTERECTOMY  1986   ovaries remain - prolaspe uterus   . APPENDECTOMY  ~ 1970  . BREAST BIOPSY Right 1980's  . BREAST LUMPECTOMY Right 1980's   Dr. Charlynne Pander   . CARDIAC CATHETERIZATION  01/07/2014  . CHOLECYSTECTOMY  ?1987  . COLONOSCOPY  2002   Dr. Anwar--> Severe diverticular changes in the region of the sigmoid and descending colon with  scattered diverticular changes throughout the rest of the colon. No polyps, ulcerations. Despite numerous manipulations, the tip of the scope could not be tipped into the cecal area.  . COLONOSCOPY  01/10/2012   Procedure: COLONOSCOPY;  Surgeon: Daneil Dolin, MD;  Location: [facility];  Service: Endoscopy;  Laterality: N/A;  1:55  . CORONARY ANGIOPLASTY WITH STENT PLACEMENT  ~ 1997 X 2   "2 + 1"  . EYE SURGERY Bilateral 2014   cataract  . INTRAVASCULAR PRESSURE WIRE/FFR STUDY N/A 03/08/2017   Procedure: Intravascular Pressure Wire/FFR  Study;  Surgeon: Nelva Bush, MD;  Location: [facility];  Service: Cardiovascular;  Laterality: N/A;  . LEFT HEART CATH AND CORONARY ANGIOGRAPHY N/A 03/08/2017   Procedure: Left Heart Cath and Coronary Angiography;  Surgeon: Nelva Bush, MD;  Location: [facility];  Service: Cardiovascular;  Laterality: N/A;  . LEFT HEART CATHETERIZATION WITH CORONARY ANGIOGRAM N/A 01/07/2014   Procedure: LEFT HEART CATHETERIZATION WITH CORONARY ANGIOGRAM;  Surgeon: Larey Dresser, MD;  Location: [facility] CATH LAB;  Service: Cardiovascular;  Laterality: N/A;    OB History    No data available       Home Medications    Prior to Admission medications   Medication Sig Start Date End Date Taking? Authorizing Provider  ALPRAZolam Duanne Moron) 0.5 MG tablet Take 1 Tablet by mouth 3 times a day as needed 05/30/17   Chipper Herb, MD  amLODipine (NORVASC) 5 MG tablet Take 1 tablet (5 mg total) by mouth daily. 03/06/17 06/04/17  Minus Breeding, MD  aspirin EC 81 MG tablet Take 1 tablet (81 mg total) by mouth daily. 02/06/17   Minus Breeding, MD  atenolol (TENORMIN) 50 MG tablet Take 1 tablet (50 mg total) by mouth 2 (two) times daily. 06/07/16   Chipper Herb, MD  Blood Glucose Monitoring Suppl (BLOOD GLUCOSE MONITOR SYSTEM) w/Device KIT Patient checks glucose TID; dispense strips and lancets also, DX: E11.65, Z79.4 07/23/17   Chipper Herb, MD  citalopram (CELEXA) 20 MG tablet Take 1 Tablet by mouth once daily 12/14/16   Chipper Herb, MD  clopidogrel (PLAVIX) 75 MG tablet Take 1 Tablet by mouth once daily 03/29/17   Chipper Herb, MD  Continuous Blood Gluc Sensor ([place]) MISC Use to check BG - change every 10 days.  DX: uncontrolled type 2 DM, requiring insulin therapy E11.65, Z79.4 Patient not taking: Reported on 03/13/2017 12/27/16   Chipper Herb, MD  empagliflozin (JARDIANCE) 10 MG TABS tablet Take 10 mg by mouth daily. 03/13/17   Cherre Robins, PharmD  furosemide (LASIX)  40 MG tablet Take 1 Tablet by mouth once daily 07/03/17   Hassell Done, Mary-Margaret, FNP  Insulin Glargine (TOUJEO SOLOSTAR) 300 UNIT/ML SOPN Inject 50-60 Units into the skin daily. 04/03/17   Cherre Robins, PharmD  Insulin Lispro (HUMALOG KWIKPEN) 200 UNIT/ML SOPN Inject 25 Units into the skin 3 (three) times daily. 04/03/17   Cherre Robins, PharmD  isosorbide mononitrate (IMDUR) 120 MG 24 hr tablet Take 1 Tablet by mouth once daily 05/22/17   Minus Breeding, MD  lisinopril (PRINIVIL,ZESTRIL) 40 MG tablet Take 40 mg by mouth daily.  01/16/17   Cherre Robins, PharmD  lisinopril (PRINIVIL,ZESTRIL) 40 MG tablet Take 1 Tablet by mouth once daily 05/23/17   Chipper Herb, MD  meclizine (ANTIVERT) 25 MG tablet Take 1 tablet (25 mg total) by mouth 3 (three) times daily as needed for dizziness. 09/29/16   Julianne Rice,  MD  metFORMIN (GLUCOPHAGE XR) 500 MG 24 hr tablet Take 1 tablet (500 mg total) by mouth daily with breakfast. 03/11/17   End, Harrell Gave, MD  nitroGLYCERIN (NITROSTAT) 0.4 MG SL tablet DISSOLVE 1 TAB UNDER TOUNGE FOR CHEST PAIN. MAY REPEAT EVERY 5 MINUTES FOR 3 DOSES. IF NO RELIEF CALL 911 OR GO TO ER 06/07/16   Chipper Herb, MD  potassium chloride SA (K-DUR,KLOR-CON) 20 MEQ tablet Take 40 mEq by mouth daily.     [provider]  rosuvastatin (CRESTOR) 40 MG tablet Take 1 Tablet by mouth once daily 06/12/17   Chipper Herb, MD  vitamin B-12 (CYANOCOBALAMIN) 1000 MCG tablet Take 1,000 mcg by mouth daily.    [provider]  Vitamin D, Ergocalciferol, (DRISDOL) 50000 units CAPS capsule TAKE ONE CAPSULE BY MOUTH EVERY SEVEN DAYS 05/14/17   Chipper Herb, MD  zolpidem (AMBIEN) 10 MG tablet TAKE ONE-HALF TO ONE TABLET BY MOUTH at bedtime AS NEEDED 07/03/17   Chevis Pretty, FNP    Family History Family History  Problem Relation Age of Onset  . Heart attack Mother 35  . Diabetes Mother   . Hypertension Mother   . Heart attack Father 32  . Heart attack Brother 32        x 6  . Colon cancer Paternal Aunt        42s, died with brain anuerysm  . Crohn's disease Cousin        paternal    Social History Social History   Tobacco Use  . Smoking status: Never Smoker  . Smokeless tobacco: Never Used  . Tobacco comment: spouse, 63 years - husband has quit 01/2011  Substance Use Topics  . Alcohol use: No  . Drug use: No     Allergies   Iohexol; Ticlid [ticlopidine hcl]; and Codeine   Review of Systems Review of Systems  Constitutional: Negative for appetite change, fatigue and fever.  HENT: Negative for congestion.   Respiratory: Negative for shortness of breath.   Cardiovascular: Negative for chest pain.  Gastrointestinal: Negative for abdominal pain.  Genitourinary: Negative for flank pain.  Musculoskeletal: Positive for gait problem. Negative for back pain.  Skin: Negative for rash.  Neurological: Positive for facial asymmetry, speech difficulty and weakness.  Hematological: Negative for adenopathy.  Psychiatric/Behavioral: Positive for confusion.     Physical Exam Updated Vital Signs BP 140/74   Pulse 73   Temp 98.2 F (36.8 C)   Resp (!) 21   Ht 5' 7"  (1.702 m)   Wt 81.6 kg (180 lb)   SpO2 93%   BMI 28.19 kg/m   Physical Exam  Constitutional: She appears well-developed and well-nourished.  HENT:  Head: Atraumatic.  Eyes: EOM are normal.  Neck: Neck supple.  Cardiovascular: Normal rate.  Musculoskeletal: She exhibits tenderness.  Mild tenderness in right calf.  Neurological: She is alert.  Patient with external ocular movements intact.  Pupils reactive.  Good eyebrow raise bilaterally.  Slightly slurred speech.  May have possible left lower facial droop.  Good grip strength bilaterally.  Finger-nose intact bilaterally.  Heel-to-shin intact bilaterally.  Good straight leg raise bilaterally.  Somewhat unsteady with standing.  Patient states both this is normal and this is not her normal.  Skin: Skin is warm. Capillary refill  takes less than 2 seconds.     ED Treatments / Results  Labs (all labs ordered are listed, but only abnormal results are displayed) Labs Reviewed  COMPREHENSIVE METABOLIC PANEL -  Abnormal; Notable for the following components:      Result Value   Potassium 3.3 (*)    Creatinine, Ser 1.18 (*)    GFR calc non Af Amer 46 (*)    GFR calc Af Amer 54 (*)    All other components within normal limits  I-STAT CHEM 8, ED - Abnormal; Notable for the following components:   Potassium 3.2 (*)    Chloride 99 (*)    Creatinine, Ser 1.20 (*)    All other components within normal limits  ETHANOL  PROTIME-INR  APTT  CBC  DIFFERENTIAL  RAPID URINE DRUG SCREEN, HOSP PERFORMED  URINALYSIS, ROUTINE W REFLEX MICROSCOPIC  I-STAT TROPONIN, ED    EKG  EKG Interpretation  Date/Time:  Saturday September 21 2017 21:34:57 EST Ventricular Rate:  75 PR Interval:    QRS Duration: 103 QT Interval:  436 QTC Calculation: 487 R Axis:   -27 Text Interpretation:  Sinus rhythm Abnormal R-wave progression, late transition Inferior infarct, old No significant change since last tracing Confirmed by Davonna Belling [phone]) on 09/21/2017 10:08:44 PM       Radiology Ct Head Wo Contrast  Result Date: 09/21/2017 CLINICAL DATA:  66 y/o F; left-sided weakness and left jaw tightening. EXAM: CT HEAD WITHOUT CONTRAST TECHNIQUE: Contiguous axial images were obtained from the base of the skull through the vertex without intravenous contrast. COMPARISON:  09/29/2016 CT head FINDINGS: Brain: No evidence of acute infarction, hemorrhage, hydrocephalus, extra-axial collection or mass lesion/mass effect. Stable chronic microvascular ischemic changes and parenchymal volume loss of the brain. Vascular: Calcific atherosclerosis of carotid siphons. Skull: Normal. Negative for fracture or focal lesion. Sinuses/Orbits: No acute finding. Other: None. IMPRESSION: 1. No acute intracranial abnormality identified. 2. Stable chronic  microvascular ischemic changes and parenchymal volume loss of the brain. Electronically Signed   By: Kristine Garbe M.D.   On: 09/21/2017 22:27    Procedures Procedures (including critical care time)  Medications Ordered in ED Medications - No data to display   Initial Impression / Assessment and Plan / ED Course  I have reviewed the triage vital signs and the nursing notes.  Pertinent labs & imaging results that were available during my care of the patient were reviewed by me and considered in my medical decision making (see chart for details).     Patient presented after having difficulty speaking walking the and standing.  Reportedly was weak on left side.  Has had similar episodes in the past where they thought was due to anxiety and she did have a stressful situation while she was arguing with her family.  However I think she needs more of a rule out.  Will admit to hospitalist. Final Clinical Impressions(s) / ED Diagnoses   Final diagnoses:  Weakness  Difficulty speaking    ED Discharge Orders    None       Davonna Belling, MD 09/21/17 2246

## 2017-09-21 NOTE — ED Notes (Signed)
Notified pt of need to pee. Stated she can't go right now and will try soon

## 2017-09-21 NOTE — ED Triage Notes (Signed)
Pt states she felt like she was having an "attack". Stated left sided jaw tightening, she was also stumbling when trying to get back to her room. States more left sided weakness.   Right lower leg and left arm pain EKG unremarkable VSS CBG 87

## 2017-09-21 NOTE — H&P (Addendum)
History and Physical    [name] CNO:[id number] DOB: [birth year] DOA: 09/21/2017  PCP: Chipper Herb, MD   Patient coming from: Home  Chief Complaint: Left-sided numbness, slurred speech   HPI: [name] is a 66 y.o. female with medical history significant for hypertension, insulin-dependent diabetes mellitus, coronary artery disease, depression with anxiety, and history of TIA, now presenting to the emergency department for evaluation of left-sided numbness, difficulty speaking, and difficulty ambulating.  Patient reports that she had been in her usual state of health, was having an argument with her family members, and developed an acute "tightening" sensation involving her left jaw associated with left-sided numbness, dysarthria, and unsteady gait.  She is a poor historian and has difficulty describing the nature of speech and gait difficulty. She took a Xanax, symptoms persisted, and she took a second Xanax at home.  Eventually, EMS was called and she reports that symptoms have been improving since that time.  Symptoms have begun roughly 5 hours prior to her presentation.  Denies chest pain, palpitations, headache, fevers, or chills.  She reports history of similar episodes 2 years ago.  ED Course: Upon arrival to the ED, patient is found to be afebrile, saturating well on room air, and with vitals stable.  EKG features a sinus rhythm with late R transition.  Noncontrast head CT is negative for acute intracranial abnormality.  Chemistry panel reveals a potassium of 3.3 and CBC is unremarkable.  Patient remained hemodynamically stable and in no apparent respiratory distress in the ED.  She reports that her presenting complaints have continued to improve.  She will be observed on the telemetry unit for ongoing evaluation and management of the aforementioned complaints, concerning for possible TIA/CVA.  Review of Systems:  All other systems reviewed and apart from HPI, are negative.  Past  Medical History:  Diagnosis Date  . Anxiety   . CAD (coronary artery disease)    DES to circumflex 02/2007, BMS to LAD and PTCA diagonal 03/2007  . Carotid artery plaque    Mild  . Cataract   . Depression   . Diverticulitis, colon   . Elevated d-dimer 01/08/2014  . Essential hypertension, benign   . GERD (gastroesophageal reflux disease)   . H/O hiatal hernia   . HLD (hyperlipidemia)   . IDDM (insulin dependent diabetes mellitus) ([place])   . Migraine    "used to have them really bad; don't have them anymore" (01/07/2014)  . MS (multiple sclerosis) ([place])    Not confirmed  . PAT (paroxysmal atrial tachycardia) ([place])   . Prolapse of uterus   . PVD (peripheral vascular disease) ([place])   . TIA (transient ischemic attack) 1980's    Past Surgical History:  Procedure Laterality Date  . ABDOMINAL HYSTERECTOMY  1986   ovaries remain - prolaspe uterus   . APPENDECTOMY  ~ 1970  . BREAST BIOPSY Right 1980's  . BREAST LUMPECTOMY Right 1980's   Dr. Charlynne Pander   . CARDIAC CATHETERIZATION  01/07/2014  . CHOLECYSTECTOMY  ?1987  . COLONOSCOPY  2002   Dr. Anwar--> Severe diverticular changes in the region of the sigmoid and descending colon with scattered diverticular changes throughout the rest of the colon. No polyps, ulcerations. Despite numerous manipulations, the tip of the scope could not be tipped into the cecal area.  . COLONOSCOPY  01/10/2012   Procedure: COLONOSCOPY;  Surgeon: Daneil Dolin, MD;  Location: [facility];  Service: Endoscopy;  Laterality: N/A;  1:55  .  CORONARY ANGIOPLASTY WITH STENT PLACEMENT  ~ 1997 X 2   "2 + 1"  . EYE SURGERY Bilateral 2014   cataract  . INTRAVASCULAR PRESSURE WIRE/FFR STUDY N/A 03/08/2017   Procedure: Intravascular Pressure Wire/FFR Study;  Surgeon: Nelva Bush, MD;  Location: [facility];  Service: Cardiovascular;  Laterality: N/A;  . LEFT HEART CATH AND CORONARY ANGIOGRAPHY N/A 03/08/2017   Procedure: Left Heart Cath and Coronary  Angiography;  Surgeon: Nelva Bush, MD;  Location: [facility];  Service: Cardiovascular;  Laterality: N/A;  . LEFT HEART CATHETERIZATION WITH CORONARY ANGIOGRAM N/A 01/07/2014   Procedure: LEFT HEART CATHETERIZATION WITH CORONARY ANGIOGRAM;  Surgeon: Larey Dresser, MD;  Location: [facility];  Service: Cardiovascular;  Laterality: N/A;     reports that  has never smoked. she has never used smokeless tobacco. She reports that she does not drink alcohol or use drugs.  Allergies  Allergen Reactions  . Iohexol      Desc: pt had syncopal episode with nausea post IV CM late 1990's,  pt has had prednisone prep with heart caths x 2 without problem  kdean 04/16/07, Onset Date: [id number]   . Ticlid [Ticlopidine Hcl] Nausea And Vomiting  . Codeine Nausea And Vomiting and Palpitations    Family History  Problem Relation Age of Onset  . Heart attack Mother 85  . Diabetes Mother   . Hypertension Mother   . Heart attack Father 61  . Heart attack Brother 32       x 6  . Colon cancer Paternal Aunt        63s, died with brain anuerysm  . Crohn's disease Cousin        paternal     Prior to Admission medications   Medication Sig Start Date End Date Taking? Authorizing Provider  ALPRAZolam Duanne Moron) 0.5 MG tablet Take 1 Tablet by mouth 3 times a day as needed 05/30/17   Chipper Herb, MD  amLODipine (NORVASC) 5 MG tablet Take 1 tablet (5 mg total) by mouth daily. 03/06/17 06/04/17  Minus Breeding, MD  aspirin EC 81 MG tablet Take 1 tablet (81 mg total) by mouth daily. 02/06/17   Minus Breeding, MD  atenolol (TENORMIN) 50 MG tablet Take 1 tablet (50 mg total) by mouth 2 (two) times daily. 06/07/16   Chipper Herb, MD  Blood Glucose Monitoring Suppl (BLOOD GLUCOSE MONITOR SYSTEM) w/Device KIT Patient checks glucose TID; dispense strips and lancets also, DX: E11.65, Z79.4 07/23/17   Chipper Herb, MD  citalopram (CELEXA) 20 MG tablet Take 1 Tablet by mouth once daily 12/14/16   Chipper Herb, MD  clopidogrel (PLAVIX) 75 MG tablet Take 1 Tablet by mouth once daily 03/29/17   Chipper Herb, MD  Continuous Blood Gluc Sensor ([place]) MISC Use to check BG - change every 10 days.  DX: uncontrolled type 2 DM, requiring insulin therapy E11.65, Z79.4 Patient not taking: Reported on 03/13/2017 12/27/16   Chipper Herb, MD  empagliflozin (JARDIANCE) 10 MG TABS tablet Take 10 mg by mouth daily. 03/13/17   Cherre Robins, PharmD  furosemide (LASIX) 40 MG tablet Take 1 Tablet by mouth once daily 07/03/17   Hassell Done, Mary-Margaret, FNP  Insulin Glargine (TOUJEO SOLOSTAR) 300 UNIT/ML SOPN Inject 50-60 Units into the skin daily. 04/03/17   Cherre Robins, PharmD  Insulin Lispro (HUMALOG KWIKPEN) 200 UNIT/ML SOPN Inject 25 Units into the skin 3 (three) times daily. 04/03/17   Eckard,  Tammy, PharmD  isosorbide mononitrate (IMDUR) 120 MG 24 hr tablet Take 1 Tablet by mouth once daily 05/22/17   Minus Breeding, MD  lisinopril (PRINIVIL,ZESTRIL) 40 MG tablet Take 40 mg by mouth daily.  01/16/17   Cherre Robins, PharmD  lisinopril (PRINIVIL,ZESTRIL) 40 MG tablet Take 1 Tablet by mouth once daily 05/23/17   Chipper Herb, MD  meclizine (ANTIVERT) 25 MG tablet Take 1 tablet (25 mg total) by mouth 3 (three) times daily as needed for dizziness. 09/29/16   Julianne Rice, MD  metFORMIN (GLUCOPHAGE XR) 500 MG 24 hr tablet Take 1 tablet (500 mg total) by mouth daily with breakfast. 03/11/17   End, Harrell Gave, MD  nitroGLYCERIN (NITROSTAT) 0.4 MG SL tablet DISSOLVE 1 TAB UNDER TOUNGE FOR CHEST PAIN. MAY REPEAT EVERY 5 MINUTES FOR 3 DOSES. IF NO RELIEF CALL 911 OR GO TO ER 06/07/16   Chipper Herb, MD  potassium chloride SA (K-DUR,KLOR-CON) 20 MEQ tablet Take 40 mEq by mouth daily.     [provider]  rosuvastatin (CRESTOR) 40 MG tablet Take 1 Tablet by mouth once daily 06/12/17   Chipper Herb, MD  vitamin B-12 (CYANOCOBALAMIN) 1000 MCG tablet Take 1,000 mcg by mouth daily.    [provider]  Vitamin D, Ergocalciferol, (DRISDOL) 50000 units CAPS capsule TAKE ONE CAPSULE BY MOUTH EVERY SEVEN DAYS 05/14/17   Chipper Herb, MD  zolpidem (AMBIEN) 10 MG tablet TAKE ONE-HALF TO ONE TABLET BY MOUTH at bedtime AS NEEDED 07/03/17   Chevis Pretty, FNP    Physical Exam: Vitals:   09/21/17 2134 09/21/17 2136 09/21/17 2146 09/21/17 2230  BP:  (!) 142/69  140/74  Pulse:  73  73  Resp:  14  (!) 21  Temp:   98.2 F (36.8 C)   SpO2:  96%  93%  Weight: 81.6 kg (180 lb)     Height: _0  (1.702 m)         Constitutional: NAD, calm, comfortable Eyes: PERTLA, lids and conjunctivae normal ENMT: Mucous membranes are moist. Posterior pharynx clear of any exudate or lesions.   Neck: normal, supple, no masses, no thyromegaly Respiratory: clear to auscultation bilaterally, no wheezing, no crackles. Normal respiratory effort. No accessory muscle use.  Cardiovascular: S1 & S2 heard, regular rate and rhythm. No significant JVD. Abdomen: No distension, no tenderness, no masses palpated. Bowel sounds normal.  Musculoskeletal: no clubbing / cyanosis. No joint deformity upper and lower extremities.   Skin: no significant rashes, lesions, ulcers. Warm, dry, well-perfused. Neurologic: CN 2-12 grossly intact. Sensation to light touch diminished at left lower face and left hand. Strength 5/5 throughout right upper and lower extremities. Left arm is intermittently weak. No dysarthria.  Psychiatric: Alert and oriented x 3. Calm, cooperative.     Labs on Admission: I have personally reviewed following labs and imaging studies  CBC: Recent Labs  Lab 09/21/17 2139 09/21/17 2152  WBC 8.0  --   NEUTROABS 3.7  --   HGB 13.3 12.6  HCT 38.0 37.0  MCV 85.2  --   PLT 179  --    Basic Metabolic Panel: Recent Labs  Lab 09/21/17 2139 09/21/17 2152  NA 139 140  K 3.3* 3.2*  CL 101 99*  CO2 24  --   GLUCOSE 81 81  BUN 16 16  CREATININE 1.18* 1.20*  CALCIUM 9.6  --     GFR: Estimated Creatinine Clearance: 49.3 mL/min (A) (by C-G formula based on SCr of 1.2  mg/dL (H)). Liver Function Tests: Recent Labs  Lab 09/21/17 2139  AST 22  ALT 15  ALKPHOS 54  BILITOT 0.4  PROT 7.8  ALBUMIN 4.1   No results for input(s): LIPASE, AMYLASE in the last 168 hours. No results for input(s): AMMONIA in the last 168 hours. Coagulation Profile: Recent Labs  Lab 09/21/17 2139  INR 0.91   Cardiac Enzymes: No results for input(s): CKTOTAL, CKMB, CKMBINDEX, TROPONINI in the last 168 hours. BNP (last 3 results) No results for input(s): PROBNP in the last 8760 hours. HbA1C: No results for input(s): HGBA1C in the last 72 hours. CBG: No results for input(s): GLUCAP in the last 168 hours. Lipid Profile: No results for input(s): CHOL, HDL, LDLCALC, TRIG, CHOLHDL, LDLDIRECT in the last 72 hours. Thyroid Function Tests: No results for input(s): TSH, T4TOTAL, FREET4, T3FREE, THYROIDAB in the last 72 hours. Anemia Panel: No results for input(s): VITAMINB12, FOLATE, FERRITIN, TIBC, IRON, RETICCTPCT in the last 72 hours. Urine analysis:    Component Value Date/Time   COLORURINE YELLOW 09/29/2016 1528   APPEARANCEUR CLEAR 09/29/2016 1528   APPEARANCEUR Clear 06/07/2016 1216   LABSPEC 1.006 09/29/2016 1528   PHURINE 6.0 09/29/2016 1528   GLUCOSEU >=500 (A) 09/29/2016 1528   HGBUR SMALL (A) 09/29/2016 1528   BILIRUBINUR NEGATIVE 09/29/2016 1528   BILIRUBINUR Negative 06/07/2016 1216   KETONESUR NEGATIVE 09/29/2016 1528   PROTEINUR 30 (A) 09/29/2016 1528   UROBILINOGEN negative 10/11/2014 1109   UROBILINOGEN 2.0 (H) 08/14/2014 1157   NITRITE NEGATIVE 09/29/2016 1528   LEUKOCYTESUR NEGATIVE 09/29/2016 1528   LEUKOCYTESUR Negative 06/07/2016 1216   Sepsis Labs: _0 (procalcitonin:4,lacticidven:4) )No results found for this or any previous visit (from the past 240 hour(s)).   Radiological Exams on Admission: Ct Head Wo Contrast  Result Date:  09/21/2017 CLINICAL DATA:  66 y/o F; left-sided weakness and left jaw tightening. EXAM: CT HEAD WITHOUT CONTRAST TECHNIQUE: Contiguous axial images were obtained from the base of the skull through the vertex without intravenous contrast. COMPARISON:  09/29/2016 CT head FINDINGS: Brain: No evidence of acute infarction, hemorrhage, hydrocephalus, extra-axial collection or mass lesion/mass effect. Stable chronic microvascular ischemic changes and parenchymal volume loss of the brain. Vascular: Calcific atherosclerosis of carotid siphons. Skull: Normal. Negative for fracture or focal lesion. Sinuses/Orbits: No acute finding. Other: None. IMPRESSION: 1. No acute intracranial abnormality identified. 2. Stable chronic microvascular ischemic changes and parenchymal volume loss of the brain. Electronically Signed   By: Kristine Garbe M.D.   On: 09/21/2017 22:27    EKG: Independently reviewed. Sinus rhythm, late R-transition.   Assessment/Plan  1. Left facial numbness, gait difficulty, dysarthria  - Presents ~5 hrs after development of vague left facial numbness and/or weakness, speech difficulty, and gait difficulty  - Symptoms have been improving and no speech difficulty or facial weakness detected in ED  - Head CT is negative for acute intracranial abnormality  - Plant to continue cardiac monitoring, frequent neuro checks, PT/OT/SLP evals, check MRI brain, MRA head, carotid dopplers, echo, fasting lipid panel, and A1c  - Continue ASA, Plavix, statin   2. Hx of TIA  - Continue ASA, Plavix, statin    3. CAD  - No anginal complaints  - Continue ASA, Plavix, Crestor, Imdur; hold lisinopril while evaluating for possible acute ischemic CVA    4. Depression; anxiety  - Reports increased anxiety attributed to arguments with family members  - Continue Celexa and prn Xanax   5. Insulin-dependent DM  - A1c was 9.3% remotely  -  Managed at home with Toujeo 50-60 units qD, Humalog 25 units TID,  metformin, and Jardiance  - Check CBG's, start Lantus 50 units, mealtime Novolog, and SSI correctional    6. Hypertension  - BP at goal  - Hold Norvasc and lisinopril initially while evaluating for possible acute ischemic CVA  7. Hypokalemia  - Serum potassium is 3.3 on admission  - Treated with 40 mEq oral potassium   - Repeat chem panel in am    DVT prophylaxis: Lovenox Code Status: Full  Family Communication: Family updated at bedside  Disposition Plan: Observe on telemetry  Consults called: None Admission status: Observation    Vianne Bulls, MD [facility] Pager [phone]  If 7PM-7AM, please contact night-coverage [email or web address] Password [email or web address]  09/21/2017, 11:04 PM

## 2017-09-22 ENCOUNTER — Observation Stay (HOSPITAL_BASED_OUTPATIENT_CLINIC_OR_DEPARTMENT_OTHER): Payer: PPO

## 2017-09-22 ENCOUNTER — Observation Stay (HOSPITAL_COMMUNITY): Payer: PPO

## 2017-09-22 DIAGNOSIS — G459 Transient cerebral ischemic attack, unspecified: Secondary | ICD-10-CM | POA: Diagnosis not present

## 2017-09-22 DIAGNOSIS — I10 Essential (primary) hypertension: Secondary | ICD-10-CM | POA: Diagnosis not present

## 2017-09-22 DIAGNOSIS — E876 Hypokalemia: Secondary | ICD-10-CM | POA: Diagnosis not present

## 2017-09-22 DIAGNOSIS — R4789 Other speech disturbances: Secondary | ICD-10-CM | POA: Diagnosis present

## 2017-09-22 DIAGNOSIS — Z7902 Long term (current) use of antithrombotics/antiplatelets: Secondary | ICD-10-CM | POA: Diagnosis not present

## 2017-09-22 DIAGNOSIS — E1169 Type 2 diabetes mellitus with other specified complication: Secondary | ICD-10-CM | POA: Diagnosis not present

## 2017-09-22 DIAGNOSIS — F418 Other specified anxiety disorders: Secondary | ICD-10-CM | POA: Diagnosis not present

## 2017-09-22 DIAGNOSIS — F411 Generalized anxiety disorder: Secondary | ICD-10-CM | POA: Diagnosis not present

## 2017-09-22 DIAGNOSIS — Z79899 Other long term (current) drug therapy: Secondary | ICD-10-CM | POA: Diagnosis not present

## 2017-09-22 DIAGNOSIS — I2510 Atherosclerotic heart disease of native coronary artery without angina pectoris: Secondary | ICD-10-CM | POA: Diagnosis not present

## 2017-09-22 DIAGNOSIS — R479 Unspecified speech disturbances: Secondary | ICD-10-CM | POA: Diagnosis not present

## 2017-09-22 DIAGNOSIS — Z794 Long term (current) use of insulin: Secondary | ICD-10-CM | POA: Diagnosis not present

## 2017-09-22 DIAGNOSIS — Z7982 Long term (current) use of aspirin: Secondary | ICD-10-CM | POA: Diagnosis not present

## 2017-09-22 DIAGNOSIS — R531 Weakness: Secondary | ICD-10-CM | POA: Diagnosis not present

## 2017-09-22 DIAGNOSIS — E785 Hyperlipidemia, unspecified: Secondary | ICD-10-CM | POA: Diagnosis not present

## 2017-09-22 LAB — ECHOCARDIOGRAM COMPLETE
Ao-asc: 28 cm
Area-P 1/2: 2.78 cm2
CHL CUP MV M VEL: 81.8
E decel time: 310 msec
E/e' ratio: 16.8
FS: 31 % (ref 28–44)
Height: 67 in
IV/PV OW: 1.21
LA ID, A-P, ES: 38 mm
LA diam end sys: 38 mm
LA vol A4C: 39.8 ml
LA vol: 31.3 mL
LADIAMINDEX: 1.97 cm/m2
LAVOLIN: 16.2 mL/m2
LDCA: 2.27 cm2
LV PW d: 11.4 mm — AB (ref 0.6–1.1)
LV SIMPSON'S DISK: 58
LV TDI E'LATERAL: 6.31
LV TDI E'MEDIAL: 4.35
LV dias vol index: 23 mL/m2
LV sys vol: 19 mL (ref 14–42)
LVDIAVOL: 45 mL — AB (ref 46–106)
LVEEAVG: 16.8
LVEEMED: 16.8
LVELAT: 6.31 cm/s
LVOT MV VTI INDEX: 0.73 cm2/m2
LVOT MV VTI: 1.41
LVOT VTI: 25.5 cm
LVOT peak grad rest: 5 mmHg
LVOTD: 17 mm
LVOTPV: 110 cm/s
LVOTSV: 58 mL
LVSYSVOLIN: 10 mL/m2
Lateral S' vel: 10.6 cm/s
MV Dec: 310
MV pk A vel: 145 m/s
MV pk E vel: 106 m/s
MVANNULUSVTI: 41 cm
MVG: 4 mmHg
MVPG: 4 mmHg
P 1/2 time: 79 ms
RV TAPSE: 16.1 mm
Stroke v: 26 ml
Weight: 2880 oz

## 2017-09-22 LAB — URINALYSIS, ROUTINE W REFLEX MICROSCOPIC
BACTERIA UA: NONE SEEN
BILIRUBIN URINE: NEGATIVE
Glucose, UA: >=500 mg/dL — AB
Ketones, ur: 20 mg/dL — AB
Leukocytes, UA: NEGATIVE
NITRITE: NEGATIVE
PROTEIN: NEGATIVE mg/dL
SPECIFIC GRAVITY, URINE: 1.027 (ref 1.005–1.030)
pH: 5 (ref 5.0–8.0)

## 2017-09-22 LAB — LIPID PANEL
Cholesterol: 98 mg/dL (ref 0–200)
HDL: 25 mg/dL — AB (ref >40)
LDL CALC: 47 mg/dL (ref 0–99)
TRIGLYCERIDES: 129 mg/dL (ref <150)
Total CHOL/HDL Ratio: 3.9 RATIO
VLDL: 26 mg/dL (ref 0–40)

## 2017-09-22 LAB — BASIC METABOLIC PANEL
ANION GAP: 13 (ref 5–15)
BUN: 18 mg/dL (ref 6–20)
CO2: 23 mmol/L (ref 22–32)
Calcium: 9.1 mg/dL (ref 8.9–10.3)
Chloride: 99 mmol/L — ABNORMAL LOW (ref 101–111)
Creatinine, Ser: 1.07 mg/dL — ABNORMAL HIGH (ref 0.44–1.00)
GFR, EST NON AFRICAN AMERICAN: 52 mL/min — AB (ref >60)
GLUCOSE: 393 mg/dL — AB (ref 65–99)
POTASSIUM: 3.6 mmol/L (ref 3.5–5.1)
Sodium: 135 mmol/L (ref 135–145)

## 2017-09-22 LAB — HEMOGLOBIN A1C
HEMOGLOBIN A1C: 9.3 % — AB (ref 4.8–5.6)
Mean Plasma Glucose: 220.21 mg/dL

## 2017-09-22 LAB — RAPID URINE DRUG SCREEN, HOSP PERFORMED
AMPHETAMINES: NOT DETECTED
Barbiturates: NOT DETECTED
Benzodiazepines: POSITIVE — AB
COCAINE: NOT DETECTED
OPIATES: NOT DETECTED
TETRAHYDROCANNABINOL: NOT DETECTED

## 2017-09-22 LAB — CBG MONITORING, ED: Glucose-Capillary: 391 mg/dL — ABNORMAL HIGH (ref 65–99)

## 2017-09-22 LAB — GLUCOSE, CAPILLARY: GLUCOSE-CAPILLARY: 285 mg/dL — AB (ref 65–99)

## 2017-09-22 NOTE — ED Notes (Signed)
[facility] in route with pt to [facility]

## 2017-09-22 NOTE — Progress Notes (Signed)
Nurse went over discharge with patient and family. Patient and family verbalized understanding. All questions and concerns addressed. Patient discharging home, all belongings sent with patient. Patient taken down in a wheelchair.

## 2017-09-22 NOTE — ED Notes (Signed)
[facility] sending truck now

## 2017-09-22 NOTE — ED Notes (Signed)
Attempted to call report.  Nurse will call me back  

## 2017-09-22 NOTE — ED Notes (Signed)
[facility] has arrived

## 2017-09-22 NOTE — Progress Notes (Signed)
  Echocardiogram 2D Echocardiogram has been performed.  [name] 09/22/2017, 12:55 PM

## 2017-09-22 NOTE — Progress Notes (Signed)
Pt. Arrived to the unit alert and oriented X 4.   Denies pain  All question and concern addressed  Oriented to equipment in the room, Bed in lowest positions and bed alarm set.

## 2017-09-22 NOTE — ED Notes (Signed)
Pt ambulating to BR °

## 2017-09-22 NOTE — ED Notes (Signed)
Pt passed swallowing screen and is eating breakfst at this time

## 2017-09-22 NOTE — Evaluation (Signed)
Physical Therapy Evaluation Patient Details Name: ELENA DAVIA MRN: [id number] DOB: Feb 21, 1951 Today's Date: 09/22/2017   History of Present Illness  66 y.o. female with medical history significant for hypertension, insulin-dependent diabetes mellitus, coronary artery disease, depression with anxiety, and history of TIA, now presenting to the emergency department for evaluation of left-sided numbness, difficulty speaking, and difficulty ambulating.   Clinical Impression  PT eval complete. Pt is independent with all functional mobility. See below for further details. Pt reports all previously reported symptoms have resolved. Sensation intact. BLE strength 5/5, symmetrical. No further PT intervention indicated. PT signing off.     Follow Up Recommendations No PT follow up    Equipment Recommendations  None recommended by PT    Recommendations for Other Services       Precautions / Restrictions Precautions Precautions: None      Mobility  Bed Mobility Overal bed mobility: Independent                Transfers Overall transfer level: Independent Equipment used: None                Ambulation/Gait Ambulation/Gait assistance: Modified independent (Device/Increase time) Ambulation Distance (Feet): 250 Feet Assistive device: None Gait Pattern/deviations: Step-through pattern;Decreased stride length Gait velocity: mildly decreased Gait velocity interpretation: Below normal speed for age/gender General Gait Details: steady gait  Stairs Stairs: Yes Stairs assistance: Supervision Stair Management: One rail Right;Alternating pattern Number of Stairs: 12    Wheelchair Mobility    Modified Rankin (Stroke Patients Only) Modified Rankin (Stroke Patients Only) Pre-Morbid Rankin Score: No symptoms Modified Rankin: No symptoms     Balance                                 Standardized Balance Assessment Standardized Balance Assessment : Dynamic Gait  Index   Dynamic Gait Index Level Surface: Normal Change in Gait Speed: Normal Gait with Horizontal Head Turns: Normal Gait with Vertical Head Turns: Normal Gait and Pivot Turn: Normal Step Over Obstacle: Normal Step Around Obstacles: Normal Steps: Normal Total Score: 24       Pertinent Vitals/Pain Pain Assessment: Faces Faces Pain Scale: Hurts a little bit Pain Location: headache from laying in MRI machine Pain Descriptors / Indicators: Headache Pain Intervention(s): Monitored during session    Home Living Family/patient expects to be discharged to:: Private residence Living Arrangements: Spouse/significant other Available Help at Discharge: Family;Friend(s);Available 24 hours/day Type of Home: House Home Access: Ramped entrance     Home Layout: One level Home Equipment: Walker - 2 wheels;Cane - single point      Prior Function Level of Independence: Independent         Comments: Active. Stil drives.     Hand Dominance   Dominant Hand: Right    Extremity/Trunk Assessment   Upper Extremity Assessment Upper Extremity Assessment: Defer to OT evaluation    Lower Extremity Assessment Lower Extremity Assessment: Overall WFL for tasks assessed(strength symmetrical)    Cervical / Trunk Assessment Cervical / Trunk Assessment: Normal  Communication   Communication: No difficulties  Cognition Arousal/Alertness: Awake/alert Behavior During Therapy: WFL for tasks assessed/performed Overall Cognitive Status: Within Functional Limits for tasks assessed                                        General Comments  Exercises     Assessment/Plan    PT Assessment Patent does not need any further PT services  PT Problem List         PT Treatment Interventions      PT Goals (Current goals can be found in the Care Plan section)  Acute Rehab PT Goals Patient Stated Goal: home PT Goal Formulation: All assessment and education complete, DC  therapy    Frequency     Barriers to discharge        Co-evaluation               AM-PAC PT "6 Clicks" Daily Activity  Outcome Measure Difficulty turning over in bed (including adjusting bedclothes, sheets and blankets)?: None Difficulty moving from lying on back to sitting on the side of the bed? : None Difficulty sitting down on and standing up from a chair with arms (e.g., wheelchair, bedside commode, etc,.)?: None Help needed moving to and from a bed to chair (including a wheelchair)?: None Help needed walking in hospital room?: None Help needed climbing 3-5 steps with a railing? : None 6 Click Score: 24    End of Session Equipment Utilized During Treatment: Gait belt Activity Tolerance: Patient tolerated treatment well Patient left: in bed;with call bell/phone within reach;with nursing/sitter in room;with family/visitor present Nurse Communication: Mobility status PT Visit Diagnosis: Other abnormalities of gait and mobility (R26.89)    Time: 1779-3903 PT Time Calculation (min) (ACUTE ONLY): 16 min   Charges:   PT Evaluation $PT Eval Low Complexity: 1 Low     PT G Codes:   PT G-Codes **NOT FOR INPATIENT CLASS** Functional Assessment Tool Used: AM-PAC 6 Clicks Basic Mobility Functional Limitation: Mobility: Walking and moving around Mobility: Walking and Moving Around Current Status (E0923): 0 percent impaired, limited or restricted Mobility: Walking and Moving Around Goal Status (R0076): 0 percent impaired, limited or restricted Mobility: Walking and Moving Around Discharge Status (A2633): 0 percent impaired, limited or restricted    Lorrin Goodell, PT  Office # [phone] Pager [phone]   Lorriane Shire 09/22/2017, 2:03 PM

## 2017-09-22 NOTE — Evaluation (Signed)
Occupational Therapy Evaluation and Discharge Patient Details Name: [name] MRN: [id number] DOB: 1950/10/05 Today's Date: 09/22/2017    History of Present Illness 66 y.o. female with medical history significant for hypertension, insulin-dependent diabetes mellitus, coronary artery disease, depression with anxiety, and history of TIA, now presenting to the emergency department for evaluation of left-sided numbness, difficulty speaking, and difficulty ambulating.    Clinical Impression   Pt reports she is mod I-independent with ADL and mobility PTA. Currently pt is at her functional baseline and is mod I with ADL and independent with mobility. Pt reports all deficits have resolved; family agrees. Reviewed signs and symptoms of CVA with pt. Pt planning to d/c home with supervision from family. No further acute OT needs identified; signing off at this time. Please re-consult if needs change. Thank you for this referral.    Follow Up Recommendations  No OT follow up    Equipment Recommendations  None recommended by OT    Recommendations for Other Services       Precautions / Restrictions Precautions Precautions: None Restrictions Weight Bearing Restrictions: No      Mobility Bed Mobility Overal bed mobility: Independent                Transfers Overall transfer level: Independent Equipment used: None                  Balance Overall balance assessment: No apparent balance deficits (not formally assessed)                                    ADL either performed or assessed with clinical judgement   ADL Overall ADL's : Modified independent                                       General ADL Comments: Reviewed signs/symptoms of CVA with pt and family, discussed importance of coming to hospital if experiencing these symptoms.     Vision Baseline Vision/History: Macular Degeneration Patient Visual Report: No change from  baseline Vision Assessment?: No apparent visual deficits     Perception     Praxis      Pertinent Vitals/Pain Pain Assessment: No/denies pain Faces Pain Scale: Hurts a little bit Pain Location: headache from laying in MRI machine Pain Descriptors / Indicators: Headache Pain Intervention(s): Monitored during session     Hand Dominance Right   Extremity/Trunk Assessment Upper Extremity Assessment Upper Extremity Assessment: Overall WFL for tasks assessed   Lower Extremity Assessment Lower Extremity Assessment: Defer to PT evaluation   Cervical / Trunk Assessment Cervical / Trunk Assessment: Normal   Communication Communication Communication: No difficulties   Cognition Arousal/Alertness: Awake/alert Behavior During Therapy: WFL for tasks assessed/performed Overall Cognitive Status: Within Functional Limits for tasks assessed                                     General Comments       Exercises     Shoulder Instructions      Home Living Family/patient expects to be discharged to:: Private residence Living Arrangements: Spouse/significant other Available Help at Discharge: Family;Friend(s);Available 24 hours/day Type of Home: House Home Access: Ramped entrance     Home Layout: One level  Bathroom Shower/Tub: Teacher, early years/pre: Standard     Home Equipment: Environmental consultant - 2 wheels;Cane - single point;Bedside commode;Shower seat;Grab bars - tub/shower          Prior Functioning/Environment Level of Independence: Independent        Comments: Active. Stil drives.        OT Problem List:        OT Treatment/Interventions:      OT Goals(Current goals can be found in the care plan section) Acute Rehab OT Goals Patient Stated Goal: home OT Goal Formulation: All assessment and education complete, DC therapy  OT Frequency:     Barriers to D/C:            Co-evaluation              AM-PAC PT "6 Clicks" Daily  Activity     Outcome Measure Help from another person eating meals?: None Help from another person taking care of personal grooming?: None Help from another person toileting, which includes using toliet, bedpan, or urinal?: None Help from another person bathing (including washing, rinsing, drying)?: None Help from another person to put on and taking off regular upper body clothing?: None Help from another person to put on and taking off regular lower body clothing?: None 6 Click Score: 24   End of Session    Activity Tolerance: Patient tolerated treatment well Patient left: Other (comment);with family/visitor present(sitting EOB)  OT Visit Diagnosis: Unsteadiness on feet (R26.81);Other abnormalities of gait and mobility (R26.89);Cognitive communication deficit (R41.841)                Time: 2426-8341 OT Time Calculation (min): 11 min Charges:  OT General Charges $OT Visit: 1 Visit OT Evaluation $OT Eval Low Complexity: 1 Low G-Codes: OT G-codes **NOT FOR INPATIENT CLASS** Functional Assessment Tool Used: AM-PAC 6 Clicks Daily Activity Functional Limitation: Self care Self Care Current Status (D6222): 0 percent impaired, limited or restricted Self Care Goal Status (L7989): 0 percent impaired, limited or restricted Self Care Discharge Status (Q1194): 0 percent impaired, limited or restricted   Mel Almond A. Ulice Brilliant, M.S., OTR/L Pager: [place] 09/22/2017, 2:52 PM

## 2017-09-22 NOTE — Discharge Summary (Signed)
Physician Discharge Summary  [name] HQI:[id number] DOB: [birth year] DOA: 09/21/2017  PCP: Chipper Herb, MD  Admit date: 09/21/2017 Discharge date: 09/22/2017  Admitted From: Home Disposition:  Home  Recommendations for Outpatient Follow-up:  1. Follow up with PCP in 1-2 weeks  Discharge Condition:Stable CODE STATUS:Full Diet recommendation: Heart healthy, diabetic   Brief/Interim Summary: Please see dictated h and p for details. Briefly, 66 y.o. female with medical history significant for hypertension, insulin-dependent diabetes mellitus, coronary artery disease, depression with anxiety, and history of TIA, now presenting to the emergency department for evaluation of left-sided numbness, difficulty speaking, and difficulty ambulating.  Patient reports that she had been in her usual state of health, was having an argument with her family members, and developed an acute "tightening" sensation involving her left jaw associated with left-sided numbness, dysarthria, and unsteady gait.  She is a poor historian and has difficulty describing the nature of speech and gait difficulty. She took a Xanax, symptoms persisted, and she took a second Xanax at home.  Eventually, EMS was called and she reports that symptoms have been improving since that time.  Symptoms have begun roughly 5 hours prior to her presentation.  Denies chest pain, palpitations, headache, fevers, or chills.  She reports history of similar episodes 2 years ago.  Of note, patient reports continue flashbacks of her husband since his death several years ago. Flashbacks are most pronounced during the holidays. Patient reported marked depression and flashbacks just prior to presenting symptoms  1. Left facial numbness, gait difficulty, dysarthria  - Presents ~5 hrs after development of vague left facial numbness and/or weakness, speech difficulty, and gait difficulty  - Symptoms have resolved wtih no speech difficulty or facial  weakness detected in ED  - Head CT is negative for acute intracranial abnormality - Follow up MRI neg for CVA  - Patient remained stable this admit - Suspect possible anxiety related symptoms related to continued flashbacks of husband's death - Continue antiplatelet per home regimen. Would have patient follow up closely with PCP  2. Hx of TIA  - Continue ASA as tolerated  3. CAD  - No anginal complaints  - Continue ASA, Crestor, Imdur; temporarily held lisinopril  4. Depression; anxiety  - Reports increased anxiety attributed to arguments with family members  - Continued flashbacks of husband's death, worse during the holidays. Patient tearful when discussing husband - Continue Celexa and prn Xanax   5. Insulin-dependent DM  - A1c was 9.3% remotely  - Managed at home with Toujeo 50-60 units qD, Humalog 25 units TID, metformin, and Jardiance  -while inpatient, continued on Lantus 50 units, mealtime Novolog, and SSI correctional    6. Hypertension  - BP at goal  -Temporarily held Norvasc and lisinopril initially while evaluating for possible acute ischemic CVA - Resume meds on discharge  7. Hypokalemia  - Serum potassium is 3.3 on admission  - Treated with 40 mEq oral potassium      Discharge Diagnoses:  Principal Problem:   TIA (transient ischemic attack) Active Problems:   Depression with anxiety   Coronary artery disease involving native coronary artery of native heart with angina pectoris (HCC)   Hypokalemia   Generalized anxiety disorder   DM type 2 with diabetic dyslipidemia ([facility])    Discharge Instructions   Allergies as of 09/22/2017      Reactions   Iohexol     Desc: pt had syncopal episode with nausea post IV CM late 1990's,  pt has  had prednisone prep with heart caths x 2 without problem  kdean 04/16/07, Onset Date: [id number]   Ticlid [ticlopidine Hcl] Nausea And Vomiting   Codeine Nausea And Vomiting, Palpitations      Medication List    TAKE  these medications   ALPRAZolam 0.5 MG tablet Commonly known as:  XANAX Take 1 Tablet by mouth 3 times a day as needed   amLODipine 5 MG tablet Commonly known as:  NORVASC Take 1 tablet (5 mg total) by mouth daily.   aspirin EC 81 MG tablet Take 1 tablet (81 mg total) by mouth daily.   atenolol 50 MG tablet Commonly known as:  TENORMIN Take 1 tablet (50 mg total) by mouth 2 (two) times daily.   Blood Glucose Monitor System w/Device Kit Patient checks glucose TID; dispense strips and lancets also, DX: E11.65, Z79.4   citalopram 20 MG tablet Commonly known as:  CELEXA Take 1 Tablet by mouth once daily   clopidogrel 75 MG tablet Commonly known as:  PLAVIX Take 1 Tablet by mouth once daily   empagliflozin 10 MG Tabs tablet Commonly known as:  JARDIANCE Take 10 mg by mouth daily.   FREESTYLE LIBRE SENSOR SYSTEM Misc Use to check BG - change every 10 days.  DX: uncontrolled type 2 DM, requiring insulin therapy E11.65, Z79.4   furosemide 40 MG tablet Commonly known as:  LASIX Take 1 Tablet by mouth once daily   Insulin Glargine 300 UNIT/ML Sopn Commonly known as:  TOUJEO SOLOSTAR Inject 50-60 Units into the skin daily.   Insulin Lispro 200 UNIT/ML Sopn Commonly known as:  HUMALOG KWIKPEN Inject 25 Units into the skin 3 (three) times daily.   isosorbide mononitrate 120 MG 24 hr tablet Commonly known as:  IMDUR Take 1 Tablet by mouth once daily   lisinopril 40 MG tablet Commonly known as:  PRINIVIL,ZESTRIL Take 40 mg by mouth daily.   lisinopril 40 MG tablet Commonly known as:  PRINIVIL,ZESTRIL Take 1 Tablet by mouth once daily   meclizine 25 MG tablet Commonly known as:  ANTIVERT Take 1 tablet (25 mg total) by mouth 3 (three) times daily as needed for dizziness.   metFORMIN 500 MG 24 hr tablet Commonly known as:  GLUCOPHAGE XR Take 1 tablet (500 mg total) by mouth daily with breakfast.   nitroGLYCERIN 0.4 MG SL tablet Commonly known as:  NITROSTAT DISSOLVE 1  TAB UNDER TOUNGE FOR CHEST PAIN. MAY REPEAT EVERY 5 MINUTES FOR 3 DOSES. IF NO RELIEF CALL 911 OR GO TO ER   potassium chloride SA 20 MEQ tablet Commonly known as:  K-DUR,KLOR-CON Take 40 mEq by mouth daily.   rosuvastatin 40 MG tablet Commonly known as:  CRESTOR Take 1 Tablet by mouth once daily   vitamin B-12 1000 MCG tablet Commonly known as:  CYANOCOBALAMIN Take 1,000 mcg by mouth daily.   Vitamin D (Ergocalciferol) 50000 units Caps capsule Commonly known as:  DRISDOL TAKE ONE CAPSULE BY MOUTH EVERY SEVEN DAYS   zolpidem 10 MG tablet Commonly known as:  AMBIEN TAKE ONE-HALF TO ONE TABLET BY MOUTH at bedtime AS NEEDED       Allergies  Allergen Reactions  . Iohexol      Desc: pt had syncopal episode with nausea post IV CM late 1990's,  pt has had prednisone prep with heart caths x 2 without problem  kdean 04/16/07, Onset Date: [id number]   . Ticlid [Ticlopidine Hcl] Nausea And Vomiting  . Codeine Nausea And Vomiting and Palpitations  Procedures/Studies: Ct Head Wo Contrast  Result Date: 09/21/2017 CLINICAL DATA:  66 y/o F; left-sided weakness and left jaw tightening. EXAM: CT HEAD WITHOUT CONTRAST TECHNIQUE: Contiguous axial images were obtained from the base of the skull through the vertex without intravenous contrast. COMPARISON:  09/29/2016 CT head FINDINGS: Brain: No evidence of acute infarction, hemorrhage, hydrocephalus, extra-axial collection or mass lesion/mass effect. Stable chronic microvascular ischemic changes and parenchymal volume loss of the brain. Vascular: Calcific atherosclerosis of carotid siphons. Skull: Normal. Negative for fracture or focal lesion. Sinuses/Orbits: No acute finding. Other: None. IMPRESSION: 1. No acute intracranial abnormality identified. 2. Stable chronic microvascular ischemic changes and parenchymal volume loss of the brain. Electronically Signed   By: Kristine Garbe M.D.   On: 09/21/2017 22:27   Mr Brain Wo  Contrast  Result Date: 09/22/2017 CLINICAL DATA:  TIA. Initial exam. Sudden onset of left-sided numbness, difficulty speaking, and difficulty ambulating. Symptoms have improved since the initial onset. EXAM: MRI HEAD WITHOUT CONTRAST MRA HEAD WITHOUT CONTRAST TECHNIQUE: Multiplanar, multiecho pulse sequences of the brain and surrounding structures were obtained without intravenous contrast. Angiographic images of the head were obtained using MRA technique without contrast. COMPARISON:  CT head without contrast 09/21/2017. MRI brain and MRA head 09/01/2015. FINDINGS: MRI HEAD FINDINGS Brain: The diffusion-weighted images demonstrate no acute or subacute infarction. No acute hemorrhage or mass lesion is present. Mild atrophy and white matter changes are similar to the prior exam. The ventricles are proportionate to the degree of atrophy. No significant extra-axial fluid collection is present. Internal auditory canals are within normal limits bilaterally. The brainstem and cerebellum are normal. Vascular: Flow is present in the major intracranial arteries. Skull and upper cervical spine: The skullbase is within normal limits. The craniocervical junction is normal. Marrow signal is unremarkable. Sinuses/Orbits: The paranasal sinuses and mastoid air cells are clear. Bilateral lens replacements are present. The globes and orbits are within normal limits. MRA HEAD FINDINGS The internal carotid artery is are within normal limits from the high cervical segments through the ICA termini bilaterally. A1 and M1 segments are normal. No definite anterior communicating artery is present. ACA and MCA branch vessels are within normal limits. The left vertebral artery is slightly dominant to the right. The right PICA origin is visualized and normal. The left PICA origin is below the field of view. The basilar artery is normal. The left posterior cerebral artery originates from the basilar tip. The right posterior cerebral artery is  of fetal type. There is asymmetric attenuation of distal PCA branch vessels on the right. IMPRESSION: 1. Stable atrophy and white matter disease, mildly advanced for age. This likely reflects the sequela of chronic microvascular ischemia. 2. No acute intracranial abnormality. 3. Mild distal small vessel disease demonstrated on the MRA, most notably within the right PCA territory. 4. Fetal type right posterior cerebral artery. 5. No significant proximal stenosis, aneurysm, or branch vessel occlusion within the circle of Willis. Electronically Signed   By: San Morelle M.D.   On: 09/22/2017 12:18   Mr Jodene Nam Head/brain XK Cm  Result Date: 09/22/2017 CLINICAL DATA:  TIA. Initial exam. Sudden onset of left-sided numbness, difficulty speaking, and difficulty ambulating. Symptoms have improved since the initial onset. EXAM: MRI HEAD WITHOUT CONTRAST MRA HEAD WITHOUT CONTRAST TECHNIQUE: Multiplanar, multiecho pulse sequences of the brain and surrounding structures were obtained without intravenous contrast. Angiographic images of the head were obtained using MRA technique without contrast. COMPARISON:  CT head without contrast 09/21/2017. MRI brain and MRA  head 09/01/2015. FINDINGS: MRI HEAD FINDINGS Brain: The diffusion-weighted images demonstrate no acute or subacute infarction. No acute hemorrhage or mass lesion is present. Mild atrophy and white matter changes are similar to the prior exam. The ventricles are proportionate to the degree of atrophy. No significant extra-axial fluid collection is present. Internal auditory canals are within normal limits bilaterally. The brainstem and cerebellum are normal. Vascular: Flow is present in the major intracranial arteries. Skull and upper cervical spine: The skullbase is within normal limits. The craniocervical junction is normal. Marrow signal is unremarkable. Sinuses/Orbits: The paranasal sinuses and mastoid air cells are clear. Bilateral lens replacements are  present. The globes and orbits are within normal limits. MRA HEAD FINDINGS The internal carotid artery is are within normal limits from the high cervical segments through the ICA termini bilaterally. A1 and M1 segments are normal. No definite anterior communicating artery is present. ACA and MCA branch vessels are within normal limits. The left vertebral artery is slightly dominant to the right. The right PICA origin is visualized and normal. The left PICA origin is below the field of view. The basilar artery is normal. The left posterior cerebral artery originates from the basilar tip. The right posterior cerebral artery is of fetal type. There is asymmetric attenuation of distal PCA branch vessels on the right. IMPRESSION: 1. Stable atrophy and white matter disease, mildly advanced for age. This likely reflects the sequela of chronic microvascular ischemia. 2. No acute intracranial abnormality. 3. Mild distal small vessel disease demonstrated on the MRA, most notably within the right PCA territory. 4. Fetal type right posterior cerebral artery. 5. No significant proximal stenosis, aneurysm, or branch vessel occlusion within the circle of Willis. Electronically Signed   By: San Morelle M.D.   On: 09/22/2017 12:18    Subjective: Eager to go home  Discharge Exam: Vitals:   09/22/17 0933 09/22/17 1030  BP: (!) 148/50 (!) 171/79  Pulse:  80  Resp:  18  Temp:    SpO2:  97%   Vitals:   09/22/17 0910 09/22/17 0915 09/22/17 0933 09/22/17 1030  BP: (!) 148/50  (!) 148/50 (!) 171/79  Pulse: 76 83  80  Resp: 16   18  Temp:      TempSrc:    Oral  SpO2: 95% 94%  97%  Weight:      Height:        General: Pt is alert, awake, not in acute distress Cardiovascular: RRR, S1/S2 +, no rubs, no gallops Respiratory: CTA bilaterally, no wheezing, no rhonchi Abdominal: Soft, NT, ND, bowel sounds + Extremities: no edema, no cyanosis   The results of significant diagnostics from this hospitalization  (including imaging, microbiology, ancillary and laboratory) are listed below for reference.     Microbiology: No results found for this or any previous visit (from the past 240 hour(s)).   Labs: BNP (last 3 results) No results for input(s): BNP in the last 8760 hours. Basic Metabolic Panel: Recent Labs  Lab 09/21/17 2139 09/21/17 2152 09/22/17 0518  NA 139 140 135  K 3.3* 3.2* 3.6  CL 101 99* 99*  CO2 24  --  23  GLUCOSE 81 81 393*  BUN 16 16 18   CREATININE 1.18* 1.20* 1.07*  CALCIUM 9.6  --  9.1   Liver Function Tests: Recent Labs  Lab 09/21/17 2139  AST 22  ALT 15  ALKPHOS 54  BILITOT 0.4  PROT 7.8  ALBUMIN 4.1   No results for input(s): LIPASE,  AMYLASE in the last 168 hours. No results for input(s): AMMONIA in the last 168 hours. CBC: Recent Labs  Lab 09/21/17 2139 09/21/17 2152  WBC 8.0  --   NEUTROABS 3.7  --   HGB 13.3 12.6  HCT 38.0 37.0  MCV 85.2  --   PLT 179  --    Cardiac Enzymes: No results for input(s): CKTOTAL, CKMB, CKMBINDEX, TROPONINI in the last 168 hours. BNP: Invalid input(s): POCBNP CBG: Recent Labs  Lab 09/22/17 0738 09/22/17 1311  GLUCAP 391* 285*   D-Dimer No results for input(s): DDIMER in the last 72 hours. Hgb A1c Recent Labs    09/22/17 0518  HGBA1C 9.3*   Lipid Profile Recent Labs    09/22/17 0518  CHOL 98  HDL 25*  LDLCALC 47  TRIG 129  CHOLHDL 3.9   Thyroid function studies No results for input(s): TSH, T4TOTAL, T3FREE, THYROIDAB in the last 72 hours.  Invalid input(s): FREET3 Anemia work up No results for input(s): VITAMINB12, FOLATE, FERRITIN, TIBC, IRON, RETICCTPCT in the last 72 hours. Urinalysis    Component Value Date/Time   COLORURINE YELLOW 09/21/2017 2136   APPEARANCEUR CLEAR 09/21/2017 2136   APPEARANCEUR Clear 06/07/2016 1216   LABSPEC 1.027 09/21/2017 2136   PHURINE 5.0 09/21/2017 2136   GLUCOSEU >=500 (A) 09/21/2017 2136   HGBUR SMALL (A) 09/21/2017 2136   BILIRUBINUR NEGATIVE  09/21/2017 2136   BILIRUBINUR Negative 06/07/2016 1216   KETONESUR 20 (A) 09/21/2017 2136   PROTEINUR NEGATIVE 09/21/2017 2136   UROBILINOGEN negative 10/11/2014 1109   UROBILINOGEN 2.0 (H) 08/14/2014 1157   NITRITE NEGATIVE 09/21/2017 2136   LEUKOCYTESUR NEGATIVE 09/21/2017 2136   LEUKOCYTESUR Negative 06/07/2016 1216   Sepsis Labs Invalid input(s): PROCALCITONIN,  WBC,  LACTICIDVEN Microbiology No results found for this or any previous visit (from the past 240 hour(s)).   SIGNED:   Marylu Lund, MD  [facility] 09/22/2017, 2:46 PM  If 7PM-7AM, please contact night-coverage [email or web address] Password [email or web address]

## 2017-09-26 ENCOUNTER — Other Ambulatory Visit: Payer: Self-pay | Admitting: *Deleted

## 2017-09-26 NOTE — Telephone Encounter (Signed)
Pt called - she is aware that [facility] approved her refills on meds - she states she is ok on refills at this time - she will call us when / if anything is needed.

## 2017-10-09 DIAGNOSIS — B078 Other viral warts: Secondary | ICD-10-CM | POA: Diagnosis not present

## 2017-10-09 DIAGNOSIS — L573 Poikiloderma of Civatte: Secondary | ICD-10-CM | POA: Diagnosis not present

## 2017-10-09 DIAGNOSIS — L821 Other seborrheic keratosis: Secondary | ICD-10-CM | POA: Diagnosis not present

## 2017-10-17 ENCOUNTER — Ambulatory Visit: Payer: PPO | Admitting: "Endocrinology

## 2017-11-13 ENCOUNTER — Ambulatory Visit: Payer: PPO | Admitting: "Endocrinology

## 2017-11-14 ENCOUNTER — Telehealth: Payer: Self-pay | Admitting: Family Medicine

## 2017-11-14 MED ORDER — ISOSORBIDE MONONITRATE ER 120 MG PO TB24: 120.0000 mg | ORAL_TABLET | Freq: Every day | ORAL | 1 refills | Status: DC

## 2017-11-14 MED ORDER — CITALOPRAM HYDROBROMIDE 20 MG PO TABS: 20.0000 mg | ORAL_TABLET | Freq: Every day | ORAL | 1 refills | Status: DC

## 2017-11-14 MED ORDER — VITAMIN D (ERGOCALCIFEROL) 1.25 MG (50000 UNIT) PO CAPS: ORAL_CAPSULE | ORAL | 0 refills | Status: DC

## 2017-11-14 MED ORDER — ATENOLOL 50 MG PO TABS: 50.0000 mg | ORAL_TABLET | Freq: Two times a day (BID) | ORAL | 1 refills | Status: DC

## 2017-11-14 MED ORDER — ALPRAZOLAM 0.5 MG PO TABS: 0.5000 mg | ORAL_TABLET | Freq: Three times a day (TID) | ORAL | 1 refills | Status: DC | PRN

## 2017-11-14 MED ORDER — AMLODIPINE BESYLATE 5 MG PO TABS: 5.0000 mg | ORAL_TABLET | Freq: Every day | ORAL | 1 refills | Status: DC

## 2017-11-14 MED ORDER — ROSUVASTATIN CALCIUM 40 MG PO TABS: 40.0000 mg | ORAL_TABLET | Freq: Every day | ORAL | 1 refills | Status: DC

## 2017-11-14 MED ORDER — NITROGLYCERIN 0.4 MG SL SUBL: SUBLINGUAL_TABLET | SUBLINGUAL | 6 refills | Status: DC

## 2017-11-14 MED ORDER — ZOLPIDEM TARTRATE 10 MG PO TABS: 5.0000 mg | ORAL_TABLET | Freq: Every evening | ORAL | 1 refills | Status: DC | PRN

## 2017-11-14 MED ORDER — FUROSEMIDE 40 MG PO TABS: 40.0000 mg | ORAL_TABLET | Freq: Every day | ORAL | 1 refills | Status: DC

## 2017-11-14 NOTE — Telephone Encounter (Signed)
Called pt - she is aware that she needs to be seen - she states she will come in in March - but needs these pended refills until then .Marland Kitchen Please sign off on refills for pt.

## 2017-11-14 NOTE — Telephone Encounter (Signed)
Please sign RXs at the bottom right

## 2017-11-14 NOTE — Telephone Encounter (Signed)
Okay to refill maintenance medicines for this patient until she is seen

## 2017-11-28 ENCOUNTER — Other Ambulatory Visit: Payer: Self-pay | Admitting: *Deleted

## 2017-11-28 ENCOUNTER — Other Ambulatory Visit: Payer: Self-pay | Admitting: Family Medicine

## 2017-11-28 MED ORDER — INSULIN GLARGINE 300 UNIT/ML ~~LOC~~ SOPN: 50.0000 [IU] | PEN_INJECTOR | Freq: Every day | SUBCUTANEOUS | 0 refills | Status: DC

## 2017-12-02 ENCOUNTER — Ambulatory Visit: Payer: PPO | Admitting: "Endocrinology

## 2017-12-11 ENCOUNTER — Ambulatory Visit (INDEPENDENT_AMBULATORY_CARE_PROVIDER_SITE_OTHER): Payer: PPO | Admitting: Family Medicine

## 2017-12-11 ENCOUNTER — Ambulatory Visit (INDEPENDENT_AMBULATORY_CARE_PROVIDER_SITE_OTHER): Payer: PPO

## 2017-12-11 ENCOUNTER — Other Ambulatory Visit: Payer: Self-pay

## 2017-12-11 ENCOUNTER — Other Ambulatory Visit: Payer: Self-pay | Admitting: Family Medicine

## 2017-12-11 ENCOUNTER — Encounter: Payer: Self-pay | Admitting: Family Medicine

## 2017-12-11 VITALS — BP 136/68 | HR 89 | Temp 98.0°F | Ht 67.0 in | Wt 177.0 lb

## 2017-12-11 DIAGNOSIS — M545 Low back pain: Secondary | ICD-10-CM | POA: Diagnosis not present

## 2017-12-11 DIAGNOSIS — I25119 Atherosclerotic heart disease of native coronary artery with unspecified angina pectoris: Secondary | ICD-10-CM

## 2017-12-11 DIAGNOSIS — I70213 Atherosclerosis of native arteries of extremities with intermittent claudication, bilateral legs: Secondary | ICD-10-CM

## 2017-12-11 DIAGNOSIS — E1165 Type 2 diabetes mellitus with hyperglycemia: Secondary | ICD-10-CM

## 2017-12-11 DIAGNOSIS — E66811 Obesity, class 1: Secondary | ICD-10-CM

## 2017-12-11 DIAGNOSIS — E785 Hyperlipidemia, unspecified: Secondary | ICD-10-CM

## 2017-12-11 DIAGNOSIS — M5440 Lumbago with sciatica, unspecified side: Secondary | ICD-10-CM

## 2017-12-11 DIAGNOSIS — E7800 Pure hypercholesterolemia, unspecified: Secondary | ICD-10-CM

## 2017-12-11 DIAGNOSIS — M25561 Pain in right knee: Secondary | ICD-10-CM

## 2017-12-11 DIAGNOSIS — E876 Hypokalemia: Secondary | ICD-10-CM

## 2017-12-11 DIAGNOSIS — E1169 Type 2 diabetes mellitus with other specified complication: Secondary | ICD-10-CM | POA: Diagnosis not present

## 2017-12-11 DIAGNOSIS — I739 Peripheral vascular disease, unspecified: Secondary | ICD-10-CM

## 2017-12-11 DIAGNOSIS — M25562 Pain in left knee: Secondary | ICD-10-CM

## 2017-12-11 DIAGNOSIS — E1140 Type 2 diabetes mellitus with diabetic neuropathy, unspecified: Secondary | ICD-10-CM | POA: Diagnosis not present

## 2017-12-11 DIAGNOSIS — Z794 Long term (current) use of insulin: Secondary | ICD-10-CM

## 2017-12-11 DIAGNOSIS — E669 Obesity, unspecified: Secondary | ICD-10-CM

## 2017-12-11 DIAGNOSIS — Z780 Asymptomatic menopausal state: Secondary | ICD-10-CM

## 2017-12-11 DIAGNOSIS — I10 Essential (primary) hypertension: Secondary | ICD-10-CM

## 2017-12-11 DIAGNOSIS — I779 Disorder of arteries and arterioles, unspecified: Secondary | ICD-10-CM

## 2017-12-11 DIAGNOSIS — M170 Bilateral primary osteoarthritis of knee: Secondary | ICD-10-CM | POA: Diagnosis not present

## 2017-12-11 DIAGNOSIS — R0789 Other chest pain: Secondary | ICD-10-CM | POA: Diagnosis not present

## 2017-12-11 MED ORDER — ZOLPIDEM TARTRATE 10 MG PO TABS: 5.0000 mg | ORAL_TABLET | Freq: Every evening | ORAL | 1 refills | Status: DC | PRN

## 2017-12-11 MED ORDER — NITROGLYCERIN 0.4 MG SL SUBL: SUBLINGUAL_TABLET | SUBLINGUAL | 6 refills | Status: DC

## 2017-12-11 NOTE — Progress Notes (Signed)
Subjective:    Patient ID: [name], female    DOB: [birth year], 67 y.o.   MRN: [id number]  HPI  Patient presents to office today for follow up of hypertension, GERD, diabetes, hyperlipidemia, generalized anxiety and B12 deficiency. Patient experienced a fall 4 weeks ago. She fell down 14 steps at her daughters home. She has been having some low back pain and leg and knee pain.  The patient remains sore from the fall 4 weeks ago.  She still having some low back pain and leg pain and knee pain.  She is currently living with her son until she can get her own apartment.  She is requesting refills on nitroglycerin and Ambien.  She is due to get a mammogram and DEXA scan chest x-ray return in [facility] and she will have to come back after 1 April to get her blood work done as it is too early for blood work today.  The patient is an insulin-dependent diabetic.  She is in the past had poor diabetic control.  It was requested that she see an endocrinologist to help get a better handle on her diabetes.  She is also had ongoing issues with hypokalemia.  She is past due her eye exam.  Patient has not seen the endocrinologist.  She has been busy dealing with her 4 year old mother who is presently getting ready to go to a nursing home.  She is living with her son.  She fell down the steps about 4 weeks ago and did not see anyone after the fall and since that fall has continued to have very severe knee pain having trouble getting up from a sitting position and with walking.  She has not had any x-rays.  She denies any chest pain pressure tightness or shortness of breath anymore than usual.  She denies any trouble with her stomach including nausea vomiting diarrhea blood in the stool or black tarry bowel movements.  She is passing her water without problems.  The biggest issues continue to be leg pain knee pain and low back pain.  Home blood sugars fasting have been running from the 185 to the 255 range according to the  patient.  Patient Active Problem List   Diagnosis Date Noted  . Difficulty speaking   . H/O prolonged Q-T interval on ECG 03/06/2017  . Abnormal cardiovascular stress test 03/06/2017  . Pre-op examination 03/06/2017  . Swelling of upper arm 07/04/2016  . Superficial bruising of arm, right, sequela 07/04/2016  . B12 deficiency 01/13/2016  . Facial droop 09/01/2015  . Stroke ([place]) 09/01/2015  . TIA (transient ischemic attack) 08/14/2014  . DM type 2 with diabetic dyslipidemia ([place]) 08/14/2014  . Cataract 02/08/2014  . Macular degeneration 02/08/2014  . Elevated d-dimer, neg VQ scan 01/08/2014  . Accelerating angina ([place]) 01/07/2014  . Chest pain 12/30/2013  . Generalized anxiety disorder 09/14/2013  . Claudication ([place]) 06/18/2013  . Diverticulosis of colon 01/01/2012  . Left lower quadrant pain 01/01/2012  . GERD (gastroesophageal reflux disease) 01/01/2012  . Constipation 01/01/2012  . Hypokalemia 08/24/2011  . Uncontrolled type 2 diabetes mellitus with insulin therapy ([place]) 08/23/2011  . Essential hypertension 08/23/2011  . PALPITATIONS 02/10/2010  . Hyperlipidemia 05/10/2009  . Depression with anxiety 05/10/2009  . Coronary artery disease involving native coronary artery of native heart with angina pectoris ([place]) 08/26/2008  . Carotid artery disease ([place]) 08/26/2008   Outpatient Encounter Medications as of 12/11/2017  Medication Sig  . ALPRAZolam (XANAX) 0.5 MG tablet  Take 1 tablet (0.5 mg total) by mouth 3 (three) times daily as needed.  Marland Kitchen amLODipine (NORVASC) 5 MG tablet Take 1 tablet (5 mg total) by mouth daily.  Marland Kitchen aspirin 325 MG tablet Take 325 mg by mouth daily.  Marland Kitchen atenolol (TENORMIN) 50 MG tablet Take 1 tablet (50 mg total) by mouth 2 (two) times daily.  . Blood Glucose Monitoring Suppl (BLOOD GLUCOSE MONITOR SYSTEM) w/Device KIT Patient checks glucose TID; dispense strips and lancets also, DX: E11.65, Z79.4  . citalopram (CELEXA) 20 MG tablet Take 1 tablet (20 mg  total) by mouth daily.  . Continuous Blood Gluc Sensor (FREESTYLE LIBRE SENSOR SYSTEM) MISC Use to check BG - change every 10 days.  DX: uncontrolled type 2 DM, requiring insulin therapy E11.65, Z79.4  . empagliflozin (JARDIANCE) 10 MG TABS tablet Take 10 mg by mouth daily.  . furosemide (LASIX) 40 MG tablet Take 1 tablet (40 mg total) by mouth daily.  . Insulin Glargine (TOUJEO SOLOSTAR) 300 UNIT/ML SOPN Inject 50-60 Units into the skin daily.  . isosorbide mononitrate (IMDUR) 120 MG 24 hr tablet Take 1 tablet (120 mg total) by mouth daily.  Marland Kitchen lisinopril (PRINIVIL,ZESTRIL) 40 MG tablet Take 1 Tablet by mouth once daily  . meclizine (ANTIVERT) 25 MG tablet Take 1 tablet (25 mg total) by mouth 3 (three) times daily as needed for dizziness.  . metFORMIN (GLUCOPHAGE XR) 500 MG 24 hr tablet Take 1 tablet (500 mg total) by mouth daily with breakfast.  . nitroGLYCERIN (NITROSTAT) 0.4 MG SL tablet DISSOLVE 1 TAB UNDER TOUNGE FOR CHEST PAIN. MAY REPEAT EVERY 5 MINUTES FOR 3 DOSES. IF NO RELIEF CALL 911 OR GO TO ER  . NOVOLOG FLEXPEN 100 UNIT/ML FlexPen INJECT 35 UNITS SQ 3 TIMES DAILY  . potassium chloride SA (K-DUR,KLOR-CON) 20 MEQ tablet Take 40 mEq by mouth daily.   . rosuvastatin (CRESTOR) 40 MG tablet Take 1 tablet (40 mg total) by mouth daily.  . vitamin B-12 (CYANOCOBALAMIN) 1000 MCG tablet Take 1,000 mcg by mouth daily.  . Vitamin D, Ergocalciferol, (DRISDOL) 50000 units CAPS capsule TAKE ONE CAPSULE BY MOUTH EVERY SEVEN DAYS  . zolpidem (AMBIEN) 10 MG tablet Take 0.5 tablets (5 mg total) by mouth at bedtime as needed.  . [DISCONTINUED] Insulin Lispro (HUMALOG KWIKPEN) 200 UNIT/ML SOPN Inject 25 Units into the skin 3 (three) times daily.  . [DISCONTINUED] aspirin EC 81 MG tablet Take 1 tablet (81 mg total) by mouth daily.  . [DISCONTINUED] clopidogrel (PLAVIX) 75 MG tablet Take 1 Tablet by mouth once daily  . [DISCONTINUED] lisinopril (PRINIVIL,ZESTRIL) 40 MG tablet Take 40 mg by mouth daily.     No facility-administered encounter medications on file as of 12/11/2017.        Review of Systems  Constitutional: Negative for activity change, appetite change, chills, diaphoresis, fatigue, fever and unexpected weight change.  HENT: Negative for congestion, dental problem, drooling, ear discharge, ear pain, facial swelling, hearing loss, mouth sores, nosebleeds, postnasal drip, rhinorrhea, sinus pressure, sinus pain, sneezing, sore throat, tinnitus, trouble swallowing and voice change.   Eyes: Negative for photophobia, pain, discharge, redness, itching and visual disturbance.  Respiratory: Negative for apnea, cough, choking, chest tightness, shortness of breath, wheezing and stridor.   Cardiovascular: Negative for chest pain, palpitations and leg swelling.  Gastrointestinal: Negative for abdominal distention, abdominal pain, anal bleeding, blood in stool, constipation, diarrhea, nausea, rectal pain and vomiting.  Endocrine: Negative for cold intolerance, heat intolerance, polydipsia, polyphagia and polyuria.  Genitourinary: Negative  for decreased urine volume, difficulty urinating, dyspareunia, dysuria, enuresis, flank pain, frequency, genital sores, hematuria, menstrual problem, pelvic pain, urgency, vaginal bleeding, vaginal discharge and vaginal pain.  Musculoskeletal: Positive for back pain. Negative for arthralgias, gait problem, joint swelling, myalgias, neck pain and neck stiffness.  Skin: Negative for color change, pallor, rash and wound.  Allergic/Immunologic: Negative for environmental allergies, food allergies and immunocompromised state.  Neurological: Negative for dizziness, tremors, seizures, syncope, facial asymmetry, speech difficulty, weakness, light-headedness, numbness and headaches.  Hematological: Negative for adenopathy. Does not bruise/bleed easily.  Psychiatric/Behavioral: Negative for agitation, behavioral problems, confusion, decreased concentration, dysphoric mood,  hallucinations, self-injury, sleep disturbance and suicidal ideas. The patient is not nervous/anxious and is not hyperactive.        Objective:   Physical Exam  Constitutional: She is oriented to person, place, and time. She appears well-developed and well-nourished.  The patient is pleasant and alert and not much of a complainer other than today complaining with both knees.  HENT:  Head: Normocephalic and atraumatic.  Right Ear: External ear normal.  Left Ear: External ear normal.  Nose: Nose normal.  Mouth/Throat: Oropharynx is clear and moist. No oropharyngeal exudate.  Eyes: Conjunctivae and EOM are normal. Pupils are equal, round, and reactive to light. Right eye exhibits no discharge. Left eye exhibits no discharge. No scleral icterus.  Neck: Normal range of motion. Neck supple. No thyromegaly present.  No thyromegaly and no bruits noted today.  Cardiovascular: Normal rate, regular rhythm and normal heart sounds.  No murmur heard. The heart is regular at 84/min, diminished pedal pulses in both feet.  Pulmonary/Chest: Effort normal and breath sounds normal. No respiratory distress. She has no wheezes. She has no rales.  Clear anteriorly and posteriorly  Abdominal: Soft. Bowel sounds are normal. She exhibits no mass. There is no tenderness. There is no rebound and no guarding.  Abdominal obesity without masses tenderness or organ enlargement or bruits  Musculoskeletal: She exhibits no edema.  Flexion and extension of both knees with subsequent knee pain both medial and lateral joint lines and the patella area bilaterally.  Leg raising was more painful in the knees than low back pain.  Lymphadenopathy:    She has no cervical adenopathy.  Neurological: She is alert and oriented to person, place, and time. She has normal reflexes. No cranial nerve deficit.  Skin: Skin is warm and dry. No rash noted.  Psychiatric: She has a normal mood and affect. Her behavior is normal. Judgment and  thought content normal.  Nursing note and vitals reviewed.   BP 136/68   Pulse 89   Temp 98 F (36.7 C) (Oral)   Ht 5' 7" (1.702 m)   Wt 177 lb (80.3 kg)   BMI 27.72 kg/m   Chest x-ray, LS spine, and bilateral knees pending====     Assessment & Plan:  1. Hypokalemia -This is been an ongoing issue with this patient.  She is currently supposed to be taking 40 mg of potassium daily.  2. Type 2 diabetes mellitus with hyperglycemia, with long-term current use of insulin ([facility]) -Her blood sugars run as high as 250 fasting.  3. Obesity (BMI 30.0-34.9) -We will continue to work on her weight through diet and exercise  4. DM type 2 with diabetic dyslipidemia ([place]) -Continue with current treatment pending results of lab work that is to be done after 1 April  5. Carotid artery disease, unspecified laterality ([place]) -Continue follow-up with cardiology  6. Type 2 diabetes mellitus  with diabetic neuropathy, with long-term current use of insulin ([place]) -Continue aggressive therapeutic lifestyle changes and current insulin dose pending results of lab work to be done.  7. Atherosclerosis of native artery of both lower extremities with intermittent claudication ([place]) -She will be encouraged to continue to walk and exercise once her knees are feeling better.  The patient definitely has peripheral vascular insufficiency.  8. Atherosclerosis of native coronary artery of native heart with angina pectoris ([place]) -Continue follow-up with cardiology  9. Acute pain of both knees -X-ray of knees and possible visit with orthopedic surgeon to further evaluate  10. Bilateral low back pain with sciatica, sciatica laterality unspecified, unspecified chronicity -LS spine films pending  11. Peripheral vascular insufficiency ([facility]) -Diminished pedal pulses bilaterally  12. Pure hypercholesterolemia -Continue with Crestor and as aggressive therapeutic lifestyle changes as possible  13. Essential  hypertension, benign -Blood pressure is good today and she will continue with current treatment  Patient Instructions  It is important that you continue to check her blood sugars regularly. You do need to get an eye exam because you have diabetes. Continue to follow-up with cardiology as planned You also are due to get your mammogram. The patient will need to come back after 1 April to get her lab work done  Arrie Senate MD

## 2017-12-11 NOTE — Addendum Note (Signed)
Addended by: Rolena Infante on: 12/11/2017 05:09 PM   Modules accepted: Orders

## 2017-12-11 NOTE — Patient Instructions (Addendum)
It is important that you continue to check her blood sugars regularly. You do need to get an eye exam because you have diabetes. Continue to follow-up with cardiology as planned You also are due to get your mammogram. The patient will need to come back after 1 April to get her lab work done

## 2017-12-12 ENCOUNTER — Telehealth: Payer: Self-pay | Admitting: Family Medicine

## 2017-12-12 MED ORDER — GLUCOSE BLOOD VI STRP: ORAL_STRIP | 12 refills | Status: DC

## 2017-12-12 NOTE — Telephone Encounter (Signed)
Pt needed a rx for strips sent over to the pharmacy. Rx sent over to pharmacy as requested and pt is aware.

## 2017-12-13 ENCOUNTER — Telehealth: Payer: Self-pay | Admitting: Family Medicine

## 2017-12-13 ENCOUNTER — Ambulatory Visit (INDEPENDENT_AMBULATORY_CARE_PROVIDER_SITE_OTHER): Payer: PPO

## 2017-12-13 ENCOUNTER — Other Ambulatory Visit: Payer: Self-pay | Admitting: *Deleted

## 2017-12-13 ENCOUNTER — Encounter: Payer: Self-pay | Admitting: Family Medicine

## 2017-12-13 DIAGNOSIS — Z780 Asymptomatic menopausal state: Secondary | ICD-10-CM

## 2017-12-13 DIAGNOSIS — G8929 Other chronic pain: Secondary | ICD-10-CM

## 2017-12-13 DIAGNOSIS — M8580 Other specified disorders of bone density and structure, unspecified site: Secondary | ICD-10-CM | POA: Insufficient documentation

## 2017-12-13 DIAGNOSIS — M25562 Pain in left knee: Principal | ICD-10-CM

## 2017-12-13 DIAGNOSIS — M810 Age-related osteoporosis without current pathological fracture: Secondary | ICD-10-CM

## 2017-12-13 DIAGNOSIS — M8589 Other specified disorders of bone density and structure, multiple sites: Secondary | ICD-10-CM | POA: Diagnosis not present

## 2017-12-13 DIAGNOSIS — M25561 Pain in right knee: Principal | ICD-10-CM

## 2017-12-18 ENCOUNTER — Ambulatory Visit (INDEPENDENT_AMBULATORY_CARE_PROVIDER_SITE_OTHER): Payer: PPO | Admitting: *Deleted

## 2017-12-18 ENCOUNTER — Other Ambulatory Visit: Payer: Self-pay | Admitting: *Deleted

## 2017-12-18 ENCOUNTER — Other Ambulatory Visit: Payer: Self-pay | Admitting: Family Medicine

## 2017-12-18 ENCOUNTER — Encounter: Payer: Self-pay | Admitting: *Deleted

## 2017-12-18 ENCOUNTER — Ambulatory Visit: Payer: PPO | Admitting: *Deleted

## 2017-12-18 VITALS — BP 137/75 | HR 73 | Ht 67.0 in | Wt 173.0 lb

## 2017-12-18 DIAGNOSIS — Z0000 Encounter for general adult medical examination without abnormal findings: Secondary | ICD-10-CM | POA: Diagnosis not present

## 2017-12-18 MED ORDER — FREESTYLE LIBRE SENSOR SYSTEM MISC: 3 refills | Status: DC

## 2017-12-18 MED ORDER — FREESTYLE LIBRE SENSOR SYSTEM MISC: 0 refills | Status: DC

## 2017-12-18 NOTE — Progress Notes (Signed)
Subjective:   [name] is a 67 y.o. female who presents for Medicare Annual (Subsequent) preventive examination.  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  She enjoys playing games on her phone and gardening.  She is very involved in her church and attends many meetings there throughout the month.  [name] lives with her son and daughter in law, and their small dog.  She has 4 children, 9 grand children and 5 great grand children.  Her husband passes away 2 year ago.  Last month she experienced a fall down several steps and has been having increased knee and leg pain since then.  She is scheduled to see an orthopedic doctor tomorrow.  [name] feels her health is about the same this year as it was last year.  She did have an ER visit and hospitalization in December of 2018 due to a stroke.   Review of Systems:   Musculoskeletal- bilateral leg a knee pain  All other systems negative    Cardiac Risk Factors include: advanced age (>80mn, >>25women);diabetes mellitus     Objective:     Vitals: BP 137/75   Pulse 73   Ht 5' 7" (1.702 m)   Wt 173 lb (78.5 kg)   BMI 27.10 kg/m   Body mass index is 27.1 kg/m.  Advanced Directives 12/18/2017 09/21/2017 03/08/2017 06/21/2016 03/02/2016 02/29/2016 09/01/2015  Does Patient Have a Medical Advance Directive? _0  No No  Would patient like information on creating a medical advance directive? Yes (ED - Information included in AVS) - - No - patient declined information No - patient declined information No - patient declined information No - patient declined information  Pre-existing out of facility DNR order (yellow form or pink MOST form) - - - - - - -    Tobacco Social History   Tobacco Use  Smoking Status Never Smoker  Smokeless Tobacco Never Used  Tobacco Comment   spouse, 426years - husband has quit 01/2011     Counseling given: No Comment: spouse, 435years - husband has quit 01/2011   Clinical  Intake:     Pain Score: 6                  Past Medical History:  Diagnosis Date  . Anxiety   . CAD (coronary artery disease)    DES to circumflex 02/2007, BMS to LAD and PTCA diagonal 03/2007  . Carotid artery plaque    Mild  . Cataract   . Depression   . Diverticulitis, colon   . Elevated d-dimer 01/08/2014  . Essential hypertension, benign   . GERD (gastroesophageal reflux disease)   . H/O hiatal hernia   . HLD (hyperlipidemia)   . ID[facility] (insulin dependent diabetes mellitus) [facility]   . Migraine    "used to have them really bad; don't have them anymore" (01/07/2014)  . MS (multiple sclerosis) ([facility]    Not confirmed  . PAT ([place]oxysmal atrial tachycardia) [place]   . Prolapse of uterus   . PVD (peripheral vascular disease) ([facility]   . TIA (transient ischemic attack) 1980's   Past Surgical History:  Procedure Laterality Date  . ABDOMINAL HYSTERECTOMY  1986   ovaries remain - prolaspe uterus   . APPENDECTOMY  ~ 1970  . BREAST BIOPSY Right 1980's  . BREAST LUMPECTOMY Right 1980's   Dr. PCharlynne Pander  . CARDIAC CATHETERIZATION  01/07/2014  . CHOLECYSTECTOMY  ?1987  .  COLONOSCOPY  2002   Dr. Anwar--> Severe diverticular changes in the region of the sigmoid and descending colon with scattered diverticular changes throughout the rest of the colon. No polyps, ulcerations. Despite numerous manipulations, the tip of the scope could not be tipped into the cecal area.  . COLONOSCOPY  01/10/2012   Procedure: COLONOSCOPY;  Surgeon: Daneil Dolin, MD;  [facility];  Service: Endoscopy;  Laterality: N/A;  1:55  . CORONARY ANGIOPLASTY WITH STENT PLACEMENT  ~ 1997 X 2   "2 + 1"  . EYE SURGERY Bilateral 2014   cataract  . INTRAVASCULAR PRESSURE WIRE/FFR STUDY N/A 03/08/2017   Procedure: Intravascular Pressure Wire/FFR Study;  Surgeon: Nelva Bush, MD;  Location: [place];  Service: Cardiovascular;  Laterality: N/A;  . LEFT HEART CATH AND CORONARY ANGIOGRAPHY N/A  03/08/2017   Procedure: Left Heart Cath and Coronary Angiography;  Surgeon: Nelva Bush, MD;  Location: [place] [facility];  Service: Cardiovascular;  Laterality: N/A;  . LEFT HEART CATHETERIZATION WITH CORONARY ANGIOGRAM N/A 01/07/2014   Procedure: LEFT HEART CATHETERIZATION WITH CORONARY ANGIOGRAM;  Surgeon: Larey Dresser, MD;  Location: [facility] CATH LAB;  Service: Cardiovascular;  Laterality: N/A;   Family History  Problem Relation Age of Onset  . Heart attack Mother 19  . Diabetes Mother   . Hypertension Mother   . Heart attack Father 39  . Heart attack Brother 32       x 6  . Colon cancer Paternal Aunt        58s, died with brain anuerysm  . Crohn's disease Cousin        paternal   Social History   Socioeconomic History  . Marital status: Widowed    Spouse name: Not on file  . Number of children: 4  . Years of education: Not on file  . Highest education level: Not on file  Occupational History  . Occupation: Disability    Employer: DISABLED  Social Needs  . Financial resource strain: Not on file  . Food insecurity:    Worry: Not on file    Inability: Not on file  . Transportation needs:    Medical: Not on file    Non-medical: Not on file  Tobacco Use  . Smoking status: Never Smoker  . Smokeless tobacco: Never Used  . Tobacco comment: spouse, 8 years - husband has quit 01/2011  Substance and Sexual Activity  . Alcohol use: No  . Drug use: No  . Sexual activity: Not Currently  Lifestyle  . Physical activity:    Days per week: Not on file    Minutes per session: Not on file  . Stress: Not on file  Relationships  . Social connections:    Talks on phone: Not on file    Gets together: Not on file    Attends religious service: Not on file    Active member of club or organization: Not on file    Attends meetings of clubs or organizations: Not on file    Relationship status: Not on file  Other Topics Concern  . Not on file  Social History Narrative  . Not on  file    Outpatient Encounter Medications as of 12/18/2017  Medication Sig  . ALPRAZolam (XANAX) 0.5 MG tablet Take 1 tablet (0.5 mg total) by mouth 3 (three) times daily as needed.  Marland Kitchen amLODipine (NORVASC) 5 MG tablet Take 1 tablet (5 mg total) by mouth daily.  Marland Kitchen aspirin 325  MG tablet Take 325 mg by mouth daily.  Marland Kitchen atenolol (TENORMIN) 50 MG tablet Take 1 tablet (50 mg total) by mouth 2 (two) times daily.  . Blood Glucose Monitoring Suppl (BLOOD GLUCOSE MONITOR SYSTEM) w/Device KIT Patient checks glucose TID; dispense strips and lancets also, DX: E11.65, Z79.4  . citalopram (CELEXA) 20 MG tablet Take 1 tablet (20 mg total) by mouth daily.  . empagliflozin (JARDIANCE) 10 MG TABS tablet Take 10 mg by mouth daily.  . furosemide (LASIX) 40 MG tablet Take 1 tablet (40 mg total) by mouth daily.  Marland Kitchen glucose blood (IGLUCOSE TEST STRIPS) test strip Use to check blood sugars three times daily Dx E11.65 Z79.4  . Insulin Glargine (TOUJEO SOLOSTAR) 300 UNIT/ML SOPN Inject 50-60 Units into the skin daily.  . isosorbide mononitrate (IMDUR) 120 MG 24 hr tablet Take 1 tablet (120 mg total) by mouth daily.  Marland Kitchen lisinopril (PRINIVIL,ZESTRIL) 40 MG tablet Take 1 Tablet by mouth once daily  . meclizine (ANTIVERT) 25 MG tablet Take 1 tablet (25 mg total) by mouth 3 (three) times daily as needed for dizziness.  . metFORMIN (GLUCOPHAGE XR) 500 MG 24 hr tablet Take 1 tablet (500 mg total) by mouth daily with breakfast.  . nitroGLYCERIN (NITROSTAT) 0.4 MG SL tablet DISSOLVE 1 TAB UNDER TOUNGE FOR CHEST PAIN. MAY REPEAT EVERY 5 MINUTES FOR 3 DOSES. IF NO RELIEF CALL 911 OR GO TO ER  . NOVOLOG FLEXPEN 100 UNIT/ML FlexPen INJECT 35 UNITS SQ 3 TIMES DAILY  . potassium chloride SA (K-DUR,KLOR-CON) 20 MEQ tablet Take 40 mEq by mouth daily.   . rosuvastatin (CRESTOR) 40 MG tablet Take 1 tablet (40 mg total) by mouth daily.  . vitamin B-12 (CYANOCOBALAMIN) 1000 MCG tablet Take 1,000 mcg by mouth daily.  . Vitamin D, Ergocalciferol,  (DRISDOL) 50000 units CAPS capsule TAKE ONE CAPSULE BY MOUTH EVERY SEVEN DAYS  . zolpidem (AMBIEN) 10 MG tablet Take 0.5 tablets (5 mg total) by mouth at bedtime as needed.  . [DISCONTINUED] Continuous Blood Gluc Sensor (FREESTYLE LIBRE SENSOR SYSTEM) MISC Use to check BG - change every 10 days.  DX: uncontrolled type 2 DM, requiring insulin therapy E11.65, Z79.4  . [DISCONTINUED] Continuous Blood Gluc Sensor (FREESTYLE LIBRE SENSOR SYSTEM) MISC Use to check BG - change every 10 days.  DX: uncontrolled type 2 DM, requiring insulin therapy E11.65, Z79.4   No facility-administered encounter medications on file as of 12/18/2017.     Activities of Daily Living In your present state of health, do you have any difficulty performing the following activities: 12/18/2017 03/08/2017  Hearing? N N  Vision? N N  Difficulty concentrating or making decisions? N N  Walking or climbing stairs? Y Y  Comment knee and leg pain -  Dressing or bathing? N N  Doing errands, shopping? N -  Preparing Food and eating ? N -  Using the Toilet? N -  In the past six months, have you accidently leaked urine? N -  Do you have problems with loss of bowel control? N -  Managing your Medications? N -  Managing your Finances? N -  Housekeeping or managing your Housekeeping? N -  Some recent data might be hidden    Patient Care Team: Chipper Herb, MD as PCP - General (Family Medicine) Gala Romney, Cristopher Estimable, MD (Gastroenterology) Minus Breeding, MD (Cardiology)    Assessment:   This is a routine wellness examination for [place].  Exercise Activities and Dietary recommendations    Patient states she  usually eats 2 meals per day- Breakfast and supper and has a snack for lunch.  She tries to avoid fried foods, and get in 2-3 servings of vegetables per day.    She does not currently participate in regular exercise due to her leg and knee pain.  Goals    . Exercise 3x per week (15-20 min per time)     Stationary bicycle  is a great option for exercise due to balance issues.    . Increase physical activity     Goal is 150 minutes per week     . Reduce sugar intake       Increase non-starchy vegetables - carrots, green bean, squash, zucchini, tomatoes, onions, peppers, spinach and other green leafy vegetables, cabbage, lettuce, cucumbers, asparagus, okra (not fried), eggplant limit sugar and processed foods (cakes, cookies, ice cream, crackers and chips) Increase fresh fruit but limit serving sizes 1/2 cup or about the size of tennis or baseball limit red meat to no more than 1-2 times per week (serving size about the size of your palm) Choose whole grains / lean proteins - whole wheat bread, quinoa, whole grain rice (1/2 cup), fish, chicken, [place]        Fall Risk Fall Risk  12/18/2017 12/11/2017 12/18/2016 08/23/2016 07/03/2016  Falls in the past year? Yes Yes Yes Yes No  Number falls in past yr: 2 or more _0 or more -  Injury with Fall? Yes Yes No No -  Comment leg and knee pain, left elbow low back pain and leg pain - - -  Risk Factor Category  High Fall Risk - - - -  Risk for fall due to : History of fall(s);Impaired balance/gait - - - -  Risk for fall due to: Comment Vertigo - - - -  Follow up Education provided;Falls prevention discussed - - - -   Is the patient's home free of loose throw rugs in walkways, pet beds, electrical cords, etc?   yes      Grab bars in the bathroom? no      Handrails on the stairs?   yes      Adequate lighting?   yes     Depression Screen PHQ 2/9 Scores 12/18/2017 12/11/2017 12/18/2016 08/23/2016  PHQ - 2 Score 2 0 1 3  PHQ- 9 Score 8 - - 9     Cognitive Function MMSE - Mini Mental State Exam 12/18/2017 06/21/2016 04/06/2015  Orientation to time _1 Orientation to Place _2 Registration _3 Attention/ Calculation _4 Recall _5 Language- name 2 objects _6 Language- repeat _7 Language- follow 3 step command _8 Language- read &  follow direction _9 Write a sentence 0 1 1  Copy design 1 0 1  Total score _10 Immunization History  Administered Date(s) Administered  . Influenza, High Dose Seasonal PF 06/19/2017  . Influenza,inj,Quad PF,6+ Mos 06/28/2015  . Influenza-Unspecified 05/25/2014, 06/15/2016  . Pneumococcal Conjugate-13 02/08/2014  . Pneumococcal Polysaccharide-23 06/28/2015  . Tdap 11/22/2010    Qualifies for Shingles Vaccine? Yes, patient put on list of interested patient's when back in stock  Screening Tests Health Maintenance  Topic Date Due  . OPHTHALMOLOGY EXAM  11/28/2016  . COLON CANCER SCREENING ANNUAL FOBT  01/12/2017  . MAMMOGRAM  07/24/2017  .  FOOT EXAM  12/18/2017  . HEMOGLOBIN A1C  03/23/2018  . DEXA SCAN  12/14/2019  . TETANUS/TDAP  11/21/2020  . COLONOSCOPY  01/09/2022  . INFLUENZA VACCINE  Completed  . Hepatitis C Screening  Completed  . PNA vac Low Risk Adult  Completed   Offered to schedule ophthalmology exam and mammogram for patient while in office today, but she states she will schedule.     Cancer Screenings: Lung: Low Dose CT Chest recommended if Age 44-80 years, 30 pack-year currently smoking OR have quit w/in 15years. Patient does qualify. Breast:  Up to date on Mammogram? No   Up to date of Bone Density/Dexa? Yes Colorectal: up to date on colonoscopy, needs to complete FOBT.  This was given at her appointment with Dr. Laurance Flatten last week       Plan:     Schedule mammogram and eye doctor appointment as soon as possible.   Complete your stool test and return to our office. When legs and knees are feeling better work on increasing exercise to 15 - 20 minutes 3 times per week (stationary bike is a great option)      I have personally reviewed and noted the following in the patient's chart:   . Medical and social history . Use of alcohol, tobacco or illicit drugs  . Current medications and supplements . Functional ability and  status . Nutritional status . Physical activity . Advanced directives . List of other physicians . Hospitalizations, surgeries, and ER visits in previous 12 months . Vitals . Screenings to include cognitive, depression, and falls . Referrals and appointments  In addition, I have reviewed and discussed with patient certain preventive protocols, quality metrics, and best practice recommendations. A written personalized care plan for preventive services as well as general preventive health recommendations were provided to patient.     ,  M, RN  12/18/2017  I have reviewed and agree with the above AWV documentation.   Arrie Senate MD

## 2017-12-18 NOTE — Patient Instructions (Signed)
Please schedule mammogram and eye doctor appointment as soon as possible.    Please complete your stool test and return to our office.  When your legs are feeling better work on increasing your exercise to 15 - 20 minutes 3 times per week (stationary bike is a great option)  Thank you for coming in for your Annual Wellness Visit today!   Preventive Care 73 Years and Older, Female Preventive care refers to lifestyle choices and visits with your health care provider that can promote health and wellness. What does preventive care include?  A yearly physical exam. This is also called an annual well check.  Dental exams once or twice a year.  Routine eye exams. Ask your health care provider how often you should have your eyes checked.  Personal lifestyle choices, including: ? Daily care of your teeth and gums. ? Regular physical activity. ? Eating a healthy diet. ? Avoiding tobacco and drug use. ? Limiting alcohol use. ? Practicing safe sex. ? Taking low-dose aspirin every day. ? Taking vitamin and mineral supplements as recommended by your health care provider. What happens during an annual well check? The services and screenings done by your health care provider during your annual well check will depend on your age, overall health, lifestyle risk factors, and family history of disease. Counseling Your health care provider may ask you questions about your:  Alcohol use.  Tobacco use.  Drug use.  Emotional well-being.  Home and relationship well-being.  Sexual activity.  Eating habits.  History of falls.  Memory and ability to understand (cognition).  Work and work Statistician.  Reproductive health.  Screening You may have the following tests or measurements:  Height, weight, and BMI.  Blood pressure.  Lipid and cholesterol levels. These may be checked every 5 years, or more frequently if you are over 27 years old.  Skin check.  Lung cancer screening. You  may have this screening every year starting at age 68 if you have a 30-pack-year history of smoking and currently smoke or have quit within the past 15 years.  Fecal occult blood test (FOBT) of the stool. You may have this test every year starting at age 52.  Flexible sigmoidoscopy or colonoscopy. You may have a sigmoidoscopy every 5 years or a colonoscopy every 10 years starting at age 44.  Hepatitis C blood test.  Hepatitis B blood test.  Sexually transmitted disease (STD) testing.  Diabetes screening. This is done by checking your blood sugar (glucose) after you have not eaten for a while (fasting). You may have this done every 1-3 years.  Bone density scan. This is done to screen for osteoporosis. You may have this done starting at age 75.  Mammogram. This may be done every 1-2 years. Talk to your health care provider about how often you should have regular mammograms.  Talk with your health care provider about your test results, treatment options, and if necessary, the need for more tests. Vaccines Your health care provider may recommend certain vaccines, such as:  Influenza vaccine. This is recommended every year.  Tetanus, diphtheria, and acellular pertussis (Tdap, Td) vaccine. You may need a Td booster every 10 years.  Varicella vaccine. You may need this if you have not been vaccinated.  Zoster vaccine. You may need this after age 15.  Measles, mumps, and rubella (MMR) vaccine. You may need at least one dose of MMR if you were born in 1957 or later. You may also need a second dose.  Pneumococcal 13-valent conjugate (PCV13) vaccine. One dose is recommended after age 72.  Pneumococcal polysaccharide (PPSV23) vaccine. One dose is recommended after age 13.  Meningococcal vaccine. You may need this if you have certain conditions.  Hepatitis A vaccine. You may need this if you have certain conditions or if you travel or work in places where you may be exposed to hepatitis  A.  Hepatitis B vaccine. You may need this if you have certain conditions or if you travel or work in places where you may be exposed to hepatitis B.  Haemophilus influenzae type b (Hib) vaccine. You may need this if you have certain conditions.  Talk to your health care provider about which screenings and vaccines you need and how often you need them. This information is not intended to replace advice given to you by your health care provider. Make sure you discuss any questions you have with your health care provider. Document Released: 10/07/2015 Document Revised: 05/30/2016 Document Reviewed: 07/12/2015 Elsevier Interactive Patient Education  2018 [place] in the Home Falls can cause injuries. They can happen to people of all ages. There are many things you can do to make your home safe and to help prevent falls. What can I do on the outside of my home?  Regularly fix the edges of walkways and driveways and fix any cracks.  Remove anything that might make you trip as you walk through a door, such as a raised step or threshold.  Trim any bushes or trees on the path to your home.  Use bright outdoor lighting.  Clear any walking paths of anything that might make someone trip, such as rocks or tools.  Regularly check to see if handrails are loose or broken. Make sure that both sides of any steps have handrails.  Any raised decks and porches should have guardrails on the edges.  Have any leaves, snow, or ice cleared regularly.  Use sand or salt on walking paths during winter.  Clean up any spills in your garage right away. This includes oil or grease spills. What can I do in the bathroom?  Use night lights.  Install grab bars by the toilet and in the tub and shower. Do not use towel bars as grab bars.  Use non-skid mats or decals in the tub or shower.  If you need to sit down in the shower, use a plastic, non-slip stool.  Keep the floor dry.  Clean up any water that spills on the floor as soon as it happens.  Remove soap buildup in the tub or shower regularly.  Attach bath mats securely with double-sided non-slip rug tape.  Do not have throw rugs and other things on the floor that can make you trip. What can I do in the bedroom?  Use night lights.  Make sure that you have a light by your bed that is easy to reach.  Do not use any sheets or blankets that are too big for your bed. They should not hang down onto the floor.  Have a firm chair that has side arms. You can use this for support while you get dressed.  Do not have throw rugs and other things on the floor that can make you trip. What can I do in the kitchen?  Clean up any spills right away.  Avoid walking on wet floors.  Keep items that you use a lot in easy-to-reach places.  If you need to reach  something above you, use a strong step stool that has a grab bar.  Keep electrical cords out of the way.  Do not use floor polish or wax that makes floors slippery. If you must use wax, use non-skid floor wax.  Do not have throw rugs and other things on the floor that can make you trip. What can I do with my stairs?  Do not leave any items on the stairs.  Make sure that there are handrails on both sides of the stairs and use them. Fix handrails that are broken or loose. Make sure that handrails are as long as the stairways.  Check any carpeting to make sure that it is firmly attached to the stairs. Fix any carpet that is loose or worn.  Avoid having throw rugs at the top or bottom of the stairs. If you do have throw rugs, attach them to the floor with carpet tape.  Make sure that you have a light switch at the top of the stairs and the bottom of the stairs. If you do not have them, ask someone to add them for you. What else can I do to help prevent falls?  Wear shoes that: ? Do not have high heels. ? Have rubber bottoms. ? Are comfortable and fit you  well. ? Are closed at the toe. Do not wear sandals.  If you use a stepladder: ? Make sure that it is fully opened. Do not climb a closed stepladder. ? Make sure that both sides of the stepladder are locked into place. ? Ask someone to hold it for you, if possible.  Clearly mark and make sure that you can see: ? Any grab bars or handrails. ? First and last steps. ? Where the edge of each step is.  Use tools that help you move around (mobility aids) if they are needed. These include: ? Canes. ? Walkers. ? Scooters. ? Crutches.  Turn on the lights when you go into a dark area. Replace any light bulbs as soon as they burn out.  Set up your furniture so you have a clear path. Avoid moving your furniture around.  If any of your floors are uneven, fix them.  If there are any pets around you, be aware of where they are.  Review your medicines with your doctor. Some medicines can make you feel dizzy. This can increase your chance of falling. Ask your doctor what other things that you can do to help prevent falls. This information is not intended to replace advice given to you by your health care provider. Make sure you discuss any questions you have with your health care provider. Document Released: 07/07/2009 Document Revised: 02/16/2016 Document Reviewed: 10/15/2014 Elsevier Interactive Patient Education  [employer].

## 2017-12-19 ENCOUNTER — Ambulatory Visit (INDEPENDENT_AMBULATORY_CARE_PROVIDER_SITE_OTHER): Payer: PPO | Admitting: Orthopaedic Surgery

## 2017-12-19 ENCOUNTER — Encounter (INDEPENDENT_AMBULATORY_CARE_PROVIDER_SITE_OTHER): Payer: Self-pay | Admitting: Orthopaedic Surgery

## 2017-12-19 VITALS — BP 123/75 | HR 73 | Ht 66.0 in | Wt 173.0 lb

## 2017-12-19 DIAGNOSIS — M1711 Unilateral primary osteoarthritis, right knee: Secondary | ICD-10-CM

## 2017-12-19 NOTE — Progress Notes (Signed)
Office Visit Note   Patient: [name]           Date of Birth: Feb 26, 1951           MRN: [id number] Visit Date: 12/19/2017              Requested by: Chipper Herb, MD [address] [place], [place] [address] PCP: Chipper Herb, MD   Assessment & Plan: Visit Diagnoses:  1. Unilateral primary osteoarthritis, right knee     Plan: Intra-articular injection performed right knee for pain post fall a month and half ago with osteoarthritis of the knee, primary.  She tolerated the injection well had improvement in her symptoms post injection.  She can return if she is having ongoing problems.  We discussed straight leg raising exercises for quadriceps strengthening discussed fall prevention.  X-ray results were reviewed with patient.  Follow-Up Instructions: No follow-ups on file.   Orders:  Orders Placed This Encounter  Procedures  . Large Joint Inj: R knee   No orders of the defined types were placed in this encounter.     Procedures: Large Joint Inj: R knee on 12/19/2017 3:29 PM Indications: pain and joint swelling Details: 22 G 1.5 in needle, anterolateral approach  Arthrogram: No  Medications: 40 mg methylPREDNISolone acetate 40 MG/ML; 0.5 mL lidocaine 1 %; 4 mL bupivacaine 0.25 % Outcome: tolerated well, no immediate complications Procedure, treatment alternatives, risks and benefits explained, specific risks discussed. Consent was given by the patient. Immediately prior to procedure a time out was called to verify the correct patient, procedure, equipment, support staff and site/side marked as required. Patient was prepped and draped in the usual sterile fashion.       Clinical Data: No additional findings.   Subjective: Chief Complaint  Patient presents with  . Left Knee - Pain  . Right Knee - Pain    HPI 67 year old female seen with complaint of bilateral knee pain worse on the right than left also some back pain.  She is had difficulty getting from  sitting to standing and increased pain with walking.  1/2 months ago after moving into her son's house she fell down the stairs and has had worse pain worse primarily in the  Right knee.  She is used arthritic cream, heat, aspirin without sustained relief.  She is had pain and swelling in her knee.  She denies fever or chills.  No locking.  She does note swelling with activity in her left knee.  Review of Systems 14 point review of systems positive for anxiety, diabetes heart disease.  Patient is on insulin.  Positive for heart disease hypertension.  Previous gallbladder surgery hysterectomy and appendectomy in the distant past.  Positive for coronary artery disease.  Patient is a widow.  She does not smoke.  Previous x-rays 12/12/1998 19- for acute fracture but did show bilateral knee degenerative changes.  Lumbar x-rays 321/2019 showed degenerative changes and spondylosis at L3-4 and L4- 5.  Patient does have slight osteopenia.   Objective: Vital Signs: BP 123/75   Pulse 73   Ht 5\' 6"  (1.676 m)   Wt 173 lb (78.5 kg)   BMI 27.92 kg/m   Physical Exam  Constitutional: She is oriented to person, place, and time. She appears well-developed.  HENT:  Head: Normocephalic.  Right Ear: External ear normal.  Left Ear: External ear normal.  Eyes: Pupils are equal, round, and reactive to light.  Neck: No tracheal deviation present. No  thyromegaly present.  Cardiovascular: Normal rate.  Pulmonary/Chest: Effort normal.  Abdominal: Soft.  Neurological: She is alert and oriented to person, place, and time.  Skin: Skin is warm and dry.  Psychiatric: She has a normal mood and affect. Her behavior is normal.    Ortho Exam patient has bilateral knee crepitus slight 2+ effusion right knee.  Pain with patellar loading worse on the right knee than left knee..  Collateral cruciate ligament exam is normal.  Distal pulses are intact.  She has mild sciatic.  Good hip range of motion.  Knee and ankle jerk are  intact.  Specialty Comments:  No specialty comments available.  Imaging: No results found.   PMFS History: Patient Active Problem List   Diagnosis Date Noted  . Osteopenia after menopause 12/13/2017  . Difficulty speaking   . H/O prolonged Q-T interval on ECG 03/06/2017  . Abnormal cardiovascular stress test 03/06/2017  . Pre-op examination 03/06/2017  . Swelling of upper arm 07/04/2016  . Superficial bruising of arm, right, sequela 07/04/2016  . B12 deficiency 01/13/2016  . Facial droop 09/01/2015  . Stroke ([place]) 09/01/2015  . TIA (transient ischemic attack) 08/14/2014  . DM type 2 with diabetic dyslipidemia ([place]) 08/14/2014  . Cataract 02/08/2014  . Macular degeneration 02/08/2014  . Elevated d-dimer, neg VQ scan 01/08/2014  . Accelerating angina ([place]) 01/07/2014  . Chest pain 12/30/2013  . Generalized anxiety disorder 09/14/2013  . Claudication ([place]) 06/18/2013  . Diverticulosis of colon 01/01/2012  . Left lower quadrant pain 01/01/2012  . GERD (gastroesophageal reflux disease) 01/01/2012  . Constipation 01/01/2012  . Hypokalemia 08/24/2011  . Uncontrolled type 2 diabetes mellitus with insulin therapy ([place]) 08/23/2011  . Essential hypertension 08/23/2011  . PALPITATIONS 02/10/2010  . Hyperlipidemia 05/10/2009  . Depression with anxiety 05/10/2009  . Coronary artery disease involving native coronary artery of native heart with angina pectoris ([place]) 08/26/2008  . Carotid artery disease ([place]) 08/26/2008   Past Medical History:  Diagnosis Date  . Anxiety   . CAD (coronary artery disease)    DES to circumflex 02/2007, BMS to LAD and PTCA diagonal 03/2007  . Carotid artery plaque    Mild  . Cataract   . Depression   . Diverticulitis, colon   . Elevated d-dimer 01/08/2014  . Essential hypertension, benign   . GERD (gastroesophageal reflux disease)   . H/O hiatal hernia   . HLD (hyperlipidemia)   . IDDM (insulin dependent diabetes mellitus) ([place])   . Migraine     "used to have them really bad; don't have them anymore" (01/07/2014)  . MS (multiple sclerosis) ([place])    Not confirmed  . PAT (paroxysmal atrial tachycardia) ([place])   . Prolapse of uterus   . PVD (peripheral vascular disease) ([place])   . TIA (transient ischemic attack) 1980's    Family History  Problem Relation Age of Onset  . Heart attack Mother 67  . Diabetes Mother   . Hypertension Mother   . Heart attack Father 20  . Heart attack Brother 32       x 6  . Colon cancer Paternal Aunt        69s, died with brain anuerysm  . Crohn's disease Cousin        paternal    Past Surgical History:  Procedure Laterality Date  . ABDOMINAL HYSTERECTOMY  1986   ovaries remain - prolaspe uterus   . APPENDECTOMY  ~ 1970  . BREAST BIOPSY Right 1980's  . BREAST LUMPECTOMY  Right 1980's   Dr. Charlynne Pander   . CARDIAC CATHETERIZATION  01/07/2014  . CHOLECYSTECTOMY  ?1987  . COLONOSCOPY  2002   Dr. Anwar--> Severe diverticular changes in the region of the sigmoid and descending colon with scattered diverticular changes throughout the rest of the colon. No polyps, ulcerations. Despite numerous manipulations, the tip of the scope could not be tipped into the cecal area.  . COLONOSCOPY  01/10/2012   Procedure: COLONOSCOPY;  Surgeon: Daneil Dolin, MD;  [facility];  Service: Endoscopy;  Laterality: N/A;  1:55  . CORONARY ANGIOPLASTY WITH STENT PLACEMENT  ~ 1997 X 2   "2 + 1"  . EYE SURGERY Bilateral 2014   cataract  . INTRAVASCULAR PRESSURE WIRE/FFR STUDY N/A 03/08/2017   Procedure: Intravascular Pressure Wire/FFR Study;  Surgeon: Nelva Bush, MD;  Location: [facility];  Service: Cardiovascular;  Laterality: N/A;  . LEFT HEART CATH AND CORONARY ANGIOGRAPHY N/A 03/08/2017   Procedure: Left Heart Cath and Coronary Angiography;  Surgeon: Nelva Bush, MD;  Location: [facility];  Service: Cardiovascular;  Laterality: N/A;  . LEFT HEART CATHETERIZATION WITH CORONARY ANGIOGRAM  N/A 01/07/2014   Procedure: LEFT HEART CATHETERIZATION WITH CORONARY ANGIOGRAM;  Surgeon: Larey Dresser, MD;  Location: [facility];  Service: Cardiovascular;  Laterality: N/A;   Social History   Occupational History  . Occupation: Disability    Employer: DISABLED  Tobacco Use  . Smoking status: Never Smoker  . Smokeless tobacco: Never Used  . Tobacco comment: spouse, 17 years - husband has quit 01/2011  Substance and Sexual Activity  . Alcohol use: No  . Drug use: No  . Sexual activity: Not Currently

## 2017-12-29 ENCOUNTER — Encounter (INDEPENDENT_AMBULATORY_CARE_PROVIDER_SITE_OTHER): Payer: Self-pay | Admitting: Orthopaedic Surgery

## 2017-12-29 MED ORDER — LIDOCAINE HCL 1 % IJ SOLN
0.5000 mL | INTRAMUSCULAR | Status: AC | PRN
  Administered 2017-12-19: .5 mL

## 2017-12-29 MED ORDER — METHYLPREDNISOLONE ACETATE 40 MG/ML IJ SUSP
40.0000 mg | INTRAMUSCULAR | Status: AC | PRN
  Administered 2017-12-19: 40 mg via INTRA_ARTICULAR

## 2017-12-29 MED ORDER — BUPIVACAINE HCL 0.25 % IJ SOLN
4.0000 mL | INTRAMUSCULAR | Status: AC | PRN
Start: 2017-12-19 — End: 2017-12-19
  Administered 2017-12-19: 4 mL via INTRA_ARTICULAR

## 2018-01-01 ENCOUNTER — Other Ambulatory Visit: Payer: Self-pay | Admitting: Family Medicine

## 2018-01-02 NOTE — Telephone Encounter (Signed)
Last seen 12/11/17  DWM  Last Vit D 12/18/16   51.4

## 2018-01-28 ENCOUNTER — Ambulatory Visit (INDEPENDENT_AMBULATORY_CARE_PROVIDER_SITE_OTHER): Payer: PPO | Admitting: Orthopaedic Surgery

## 2018-01-30 ENCOUNTER — Encounter (INDEPENDENT_AMBULATORY_CARE_PROVIDER_SITE_OTHER): Payer: Self-pay | Admitting: Orthopaedic Surgery

## 2018-01-30 ENCOUNTER — Ambulatory Visit (INDEPENDENT_AMBULATORY_CARE_PROVIDER_SITE_OTHER): Payer: PPO | Admitting: Orthopaedic Surgery

## 2018-01-30 VITALS — BP 139/69 | HR 74 | Ht 66.0 in | Wt 175.0 lb

## 2018-01-30 DIAGNOSIS — M1712 Unilateral primary osteoarthritis, left knee: Secondary | ICD-10-CM | POA: Diagnosis not present

## 2018-01-30 MED ORDER — METHYLPREDNISOLONE ACETATE 40 MG/ML IJ SUSP
40.0000 mg | INTRAMUSCULAR | Status: AC | PRN
  Administered 2018-01-30: 40 mg via INTRA_ARTICULAR

## 2018-01-30 MED ORDER — BUPIVACAINE HCL 0.5 % IJ SOLN
3.0000 mL | INTRAMUSCULAR | Status: AC | PRN
  Administered 2018-01-30: 3 mL via INTRA_ARTICULAR

## 2018-01-30 MED ORDER — LIDOCAINE HCL 1 % IJ SOLN
0.5000 mL | INTRAMUSCULAR | Status: AC | PRN
Start: 2018-01-30 — End: 2018-01-30
  Administered 2018-01-30: .5 mL

## 2018-01-30 NOTE — Progress Notes (Signed)
Office Visit Note   Patient: [name]           Date of Birth: 08/31/1951           MRN: [id number] Visit Date: 01/30/2018              Requested by: Chipper Herb, MD [address] [place], [place] [address] PCP: Chipper Herb, MD   Assessment & Plan: Visit Diagnoses:  1. Unilateral primary osteoarthritis, left knee     Plan: Injection performed which she tolerated well.  She understands at some point she may require total knee arthroplasty.  She can return as needed.   Follow-Up Instructions: No follow-ups on file.   Orders:  Orders Placed This Encounter  Procedures  . Large Joint Inj: L knee   No orders of the defined types were placed in this encounter.     Procedures: Large Joint Inj: L knee on 01/30/2018 9:29 AM Indications: joint swelling and pain Details: 22 G 1.5 in needle, anterolateral approach  Arthrogram: No  Medications: 0.5 mL lidocaine 1 %; 3 mL bupivacaine 0.5 %; 40 mg methylPREDNISolone acetate 40 MG/ML Outcome: tolerated well, no immediate complications Procedure, treatment alternatives, risks and benefits explained, specific risks discussed. Consent was given by the patient. Immediately prior to procedure a time out was called to verify the correct patient, procedure, equipment, support staff and site/side marked as required. Patient was prepped and draped in the usual sterile fashion.       Clinical Data: No additional findings.   Subjective: Chief Complaint  Patient presents with  . Left Knee - Pain    HPI 67 year old female seen with bilateral knee osteoarthritis worse on x-rays on the left than right.  Last visit she had an injection in her right knee and states the right knee is been doing great the right knee but the left knee is giving her increased problems with pain swelling is ice heat topical creams, heating  Pad.  She has been taking care of her mother who has dementia.  Review of Systems is updated unchanged from last  office visit.  Her Neri artery disease depression and type 2 diabetes.   Objective: Vital Signs: BP 139/69   Pulse 74   Ht 5\' 6"  (1.676 m)   Wt 175 lb (79.4 kg)   BMI 28.25 kg/m   Physical Exam  Constitutional: She is oriented to person, place, and time. She appears well-developed.  HENT:  Head: Normocephalic.  Right Ear: External ear normal.  Left Ear: External ear normal.  Eyes: Pupils are equal, round, and reactive to light.  Neck: No tracheal deviation present. No thyromegaly present.  Cardiovascular: Normal rate.  Pulmonary/Chest: Effort normal.  Abdominal: Soft.  Neurological: She is alert and oriented to person, place, and time.  Skin: Skin is warm and dry.  Psychiatric: She has a normal mood and affect. Her behavior is normal.    Ortho Exam left knee crepitus with range of motion no pain with hip range of motion no sciatic notch tenderness distal pulses are intact no rash over exposed skin.  Right knee shows mild crepitus no pain with range of motion.  Collateral ligaments are intact.  Specialty Comments:  No specialty comments available.  Imaging: No results found.   PMFS History: Patient Active Problem List   Diagnosis Date Noted  . Osteopenia after menopause 12/13/2017  . Difficulty speaking   . H/O prolonged Q-T interval on ECG 03/06/2017  . Abnormal  cardiovascular stress test 03/06/2017  . Pre-op examination 03/06/2017  . Swelling of upper arm 07/04/2016  . Superficial bruising of arm, right, sequela 07/04/2016  . B12 deficiency 01/13/2016  . Facial droop 09/01/2015  . Stroke ([place]) 09/01/2015  . TIA (transient ischemic attack) 08/14/2014  . DM type 2 with diabetic dyslipidemia ([place]) 08/14/2014  . Cataract 02/08/2014  . Macular degeneration 02/08/2014  . Elevated d-dimer, neg VQ scan 01/08/2014  . Accelerating angina ([place]) 01/07/2014  . Chest pain 12/30/2013  . Generalized anxiety disorder 09/14/2013  . Claudication ([place]) 06/18/2013  .  Diverticulosis of colon 01/01/2012  . Left lower quadrant pain 01/01/2012  . GERD (gastroesophageal reflux disease) 01/01/2012  . Constipation 01/01/2012  . Hypokalemia 08/24/2011  . Uncontrolled type 2 diabetes mellitus with insulin therapy ([place]) 08/23/2011  . Essential hypertension 08/23/2011  . PALPITATIONS 02/10/2010  . Hyperlipidemia 05/10/2009  . Depression with anxiety 05/10/2009  . Coronary artery disease involving native coronary artery of native heart with angina pectoris ([place]) 08/26/2008  . Carotid artery disease ([place]) 08/26/2008   Past Medical History:  Diagnosis Date  . Anxiety   . CAD (coronary artery disease)    DES to circumflex 02/2007, BMS to LAD and PTCA diagonal 03/2007  . Carotid artery plaque    Mild  . Cataract   . Depression   . Diverticulitis, colon   . Elevated d-dimer 01/08/2014  . Essential hypertension, benign   . GERD (gastroesophageal reflux disease)   . H/O hiatal hernia   . HLD (hyperlipidemia)   . IDDM (insulin dependent diabetes mellitus) ([place])   . Migraine    "used to have them really bad; don't have them anymore" (01/07/2014)  . MS (multiple sclerosis) ([place])    Not confirmed  . PAT (paroxysmal atrial tachycardia) ([place])   . Prolapse of uterus   . PVD (peripheral vascular disease) ([place])   . TIA (transient ischemic attack) 1980's    Family History  Problem Relation Age of Onset  . Heart attack Mother 35  . Diabetes Mother   . Hypertension Mother   . Heart attack Father 101  . Heart attack Brother 32       x 6  . Colon cancer Paternal Aunt        97s, died with brain anuerysm  . Crohn's disease Cousin        paternal    Past Surgical History:  Procedure Laterality Date  . ABDOMINAL HYSTERECTOMY  1986   ovaries remain - prolaspe uterus   . APPENDECTOMY  ~ 1970  . BREAST BIOPSY Right 1980's  . BREAST LUMPECTOMY Right 1980's   Dr. Charlynne Pander   . CARDIAC CATHETERIZATION  01/07/2014  . CHOLECYSTECTOMY  ?1987  . COLONOSCOPY  2002   Dr.  Anwar--> Severe diverticular changes in the region of the sigmoid and descending colon with scattered diverticular changes throughout the rest of the colon. No polyps, ulcerations. Despite numerous manipulations, the tip of the scope could not be tipped into the cecal area.  . COLONOSCOPY  01/10/2012   Procedure: COLONOSCOPY;  Surgeon: Daneil Dolin, MD;  [facility];  Service: Endoscopy;  Laterality: N/A;  1:55  . CORONARY ANGIOPLASTY WITH STENT PLACEMENT  ~ 1997 X 2   "2 + 1"  . EYE SURGERY Bilateral 2014   cataract  . INTRAVASCULAR PRESSURE WIRE/FFR STUDY N/A 03/08/2017   Procedure: Intravascular Pressure Wire/FFR Study;  Surgeon: Nelva Bush, MD;  Location: [facility];  Service: Cardiovascular;  Laterality: N/A;  . LEFT HEART CATH AND CORONARY ANGIOGRAPHY N/A 03/08/2017   Procedure: Left Heart Cath and Coronary Angiography;  Surgeon: Nelva Bush, MD;  Location: [facility];  Service: Cardiovascular;  Laterality: N/A;  . LEFT HEART CATHETERIZATION WITH CORONARY ANGIOGRAM N/A 01/07/2014   Procedure: LEFT HEART CATHETERIZATION WITH CORONARY ANGIOGRAM;  Surgeon: Larey Dresser, MD;  Location: [facility] CATH LAB;  Service: Cardiovascular;  Laterality: N/A;   Social History   Occupational History  . Occupation: Disability    Employer: DISABLED  Tobacco Use  . Smoking status: Never Smoker  . Smokeless tobacco: Never Used  . Tobacco comment: spouse, 64 years - husband has quit 01/2011  Substance and Sexual Activity  . Alcohol use: No  . Drug use: No  . Sexual activity: Not Currently

## 2018-02-13 ENCOUNTER — Other Ambulatory Visit: Payer: Self-pay | Admitting: Family Medicine

## 2018-03-20 ENCOUNTER — Telehealth: Payer: Self-pay | Admitting: Family Medicine

## 2018-03-20 NOTE — Telephone Encounter (Signed)
Pt aware  - in fridge

## 2018-04-23 ENCOUNTER — Encounter: Payer: Self-pay | Admitting: Family Medicine

## 2018-04-23 ENCOUNTER — Ambulatory Visit (INDEPENDENT_AMBULATORY_CARE_PROVIDER_SITE_OTHER): Payer: PPO | Admitting: Family Medicine

## 2018-04-23 VITALS — BP 111/68 | HR 86 | Temp 98.2°F | Ht 66.0 in | Wt 180.0 lb

## 2018-04-23 DIAGNOSIS — Z794 Long term (current) use of insulin: Secondary | ICD-10-CM

## 2018-04-23 DIAGNOSIS — E1165 Type 2 diabetes mellitus with hyperglycemia: Secondary | ICD-10-CM

## 2018-04-23 DIAGNOSIS — E785 Hyperlipidemia, unspecified: Secondary | ICD-10-CM

## 2018-04-23 DIAGNOSIS — R7989 Other specified abnormal findings of blood chemistry: Secondary | ICD-10-CM | POA: Diagnosis not present

## 2018-04-23 DIAGNOSIS — I739 Peripheral vascular disease, unspecified: Secondary | ICD-10-CM | POA: Diagnosis not present

## 2018-04-23 DIAGNOSIS — I10 Essential (primary) hypertension: Secondary | ICD-10-CM

## 2018-04-23 DIAGNOSIS — E1169 Type 2 diabetes mellitus with other specified complication: Secondary | ICD-10-CM

## 2018-04-23 DIAGNOSIS — E669 Obesity, unspecified: Secondary | ICD-10-CM | POA: Diagnosis not present

## 2018-04-23 DIAGNOSIS — E7800 Pure hypercholesterolemia, unspecified: Secondary | ICD-10-CM | POA: Diagnosis not present

## 2018-04-23 DIAGNOSIS — E1129 Type 2 diabetes mellitus with other diabetic kidney complication: Secondary | ICD-10-CM | POA: Diagnosis not present

## 2018-04-23 DIAGNOSIS — K219 Gastro-esophageal reflux disease without esophagitis: Secondary | ICD-10-CM | POA: Diagnosis not present

## 2018-04-23 DIAGNOSIS — I779 Disorder of arteries and arterioles, unspecified: Secondary | ICD-10-CM | POA: Diagnosis not present

## 2018-04-23 DIAGNOSIS — I70213 Atherosclerosis of native arteries of extremities with intermittent claudication, bilateral legs: Secondary | ICD-10-CM

## 2018-04-23 DIAGNOSIS — R5383 Other fatigue: Secondary | ICD-10-CM

## 2018-04-23 LAB — BAYER DCA HB A1C WAIVED: HB A1C: 8.5 % — AB (ref <7.0)

## 2018-04-23 MED ORDER — ISOSORBIDE MONONITRATE ER 120 MG PO TB24: ORAL_TABLET | ORAL | 3 refills | Status: DC

## 2018-04-23 MED ORDER — METFORMIN HCL ER 500 MG PO TB24: 500.0000 mg | ORAL_TABLET | Freq: Every day | ORAL | 3 refills | Status: DC

## 2018-04-23 MED ORDER — POTASSIUM CHLORIDE CRYS ER 20 MEQ PO TBCR: 40.0000 meq | EXTENDED_RELEASE_TABLET | Freq: Every day | ORAL | 3 refills | Status: DC

## 2018-04-23 MED ORDER — ALPRAZOLAM 0.5 MG PO TABS: ORAL_TABLET | ORAL | 4 refills | Status: DC

## 2018-04-23 MED ORDER — ZOLPIDEM TARTRATE 10 MG PO TABS: 5.0000 mg | ORAL_TABLET | Freq: Every evening | ORAL | 4 refills | Status: DC | PRN

## 2018-04-23 MED ORDER — LISINOPRIL 40 MG PO TABS: 40.0000 mg | ORAL_TABLET | Freq: Every day | ORAL | 3 refills | Status: DC

## 2018-04-23 MED ORDER — ROSUVASTATIN CALCIUM 40 MG PO TABS: ORAL_TABLET | ORAL | 3 refills | Status: DC

## 2018-04-23 MED ORDER — FUROSEMIDE 40 MG PO TABS: ORAL_TABLET | ORAL | 3 refills | Status: DC

## 2018-04-23 MED ORDER — ATENOLOL 50 MG PO TABS: 50.0000 mg | ORAL_TABLET | Freq: Two times a day (BID) | ORAL | 3 refills | Status: DC

## 2018-04-23 MED ORDER — AMLODIPINE BESYLATE 5 MG PO TABS: ORAL_TABLET | ORAL | 3 refills | Status: DC

## 2018-04-23 MED ORDER — CITALOPRAM HYDROBROMIDE 20 MG PO TABS: ORAL_TABLET | ORAL | 3 refills | Status: DC

## 2018-04-23 MED ORDER — VITAMIN D (ERGOCALCIFEROL) 1.25 MG (50000 UNIT) PO CAPS: ORAL_CAPSULE | ORAL | 3 refills | Status: DC

## 2018-04-23 NOTE — Progress Notes (Signed)
Subjective:    Patient ID: [name], female    DOB: 1950-11-10, 67 y.o.   MRN: [id number]  HPI Pt here for follow up and management of chronic medical problems which includes diabetes, hyperlipidemia and hypertension. She is taking medication regularly.  The patient's main complaint today is no energy.  She is requesting refills on several of her medicines.  Her vital signs are stable and her weight is up 5 pounds.  Her body mass index is 28.26.  She is due to get a mammogram and return in FOBT and will get lab work today.  The patient was living with her son and is now living in her own apartment.  The patient is calm and pleasant and doing well overall other than some complaints with arthritis in both of her knees and she is seeing an orthopedist about this and getting injections.  Today she denies any chest pain pressure tightness or shortness of breath.  She denies any GI symptoms including nausea vomiting diarrhea blood in the stool black tarry bowel movements or change in bowel habits.  She is passing her water well.  Her fasting blood sugar this morning at home was 289.  She has had problems with low potassium in the past and she is complaining with no energy.  We will make sure that we check the potassium and get a B12 level on her today.  She understands that she needs to get an eye exam done and needs to get a mammogram done.  She promises to get these.  No complaints today with claudication.    Patient Active Problem List   Diagnosis Date Noted  . Osteopenia after menopause 12/13/2017  . Difficulty speaking   . H/O prolonged Q-T interval on ECG 03/06/2017  . Abnormal cardiovascular stress test 03/06/2017  . Pre-op examination 03/06/2017  . Swelling of upper arm 07/04/2016  . Superficial bruising of arm, right, sequela 07/04/2016  . B12 deficiency 01/13/2016  . Facial droop 09/01/2015  . Stroke ([place]) 09/01/2015  . TIA (transient ischemic attack) 08/14/2014  . DM type 2 with  diabetic dyslipidemia ([place]) 08/14/2014  . Cataract 02/08/2014  . Macular degeneration 02/08/2014  . Elevated d-dimer, neg VQ scan 01/08/2014  . Accelerating angina ([place]) 01/07/2014  . Chest pain 12/30/2013  . Generalized anxiety disorder 09/14/2013  . Claudication ([place]) 06/18/2013  . Diverticulosis of colon 01/01/2012  . Left lower quadrant pain 01/01/2012  . GERD (gastroesophageal reflux disease) 01/01/2012  . Constipation 01/01/2012  . Hypokalemia 08/24/2011  . Uncontrolled type 2 diabetes mellitus with insulin therapy ([place]) 08/23/2011  . Essential hypertension 08/23/2011  . PALPITATIONS 02/10/2010  . Hyperlipidemia 05/10/2009  . Depression with anxiety 05/10/2009  . Coronary artery disease involving native coronary artery of native heart with angina pectoris ([place]) 08/26/2008  . Carotid artery disease ([place]) 08/26/2008   Outpatient Encounter Medications as of 04/23/2018  Medication Sig  . ALPRAZolam (XANAX) 0.5 MG tablet TAKE ONE TABLET 3 TIMES A DAY AS NEEDED.  Marland Kitchen amLODipine (NORVASC) 5 MG tablet TAKE ONE (1) TABLET EACH DAY  . aspirin 325 MG tablet Take 325 mg by mouth daily.  Marland Kitchen atenolol (TENORMIN) 50 MG tablet TAKE ONE TABLET BY MOUTH TWICE DAILY  . Blood Glucose Monitoring Suppl (BLOOD GLUCOSE MONITOR SYSTEM) w/Device KIT Patient checks glucose TID; dispense strips and lancets also, DX: E11.65, Z79.4  . citalopram (CELEXA) 20 MG tablet TAKE ONE (1) TABLET EACH DAY  . Continuous Blood Gluc Sensor (FREESTYLE LIBRE 14  DAY SENSOR) MISC USE TO CHECK BLOOD GLUCOSE CHANGE EVERY 14 DAYS  . empagliflozin (JARDIANCE) 10 MG TABS tablet Take 10 mg by mouth daily.  . furosemide (LASIX) 40 MG tablet TAKE ONE (1) TABLET EACH DAY  . glucose blood (IGLUCOSE TEST STRIPS) test strip Use to check blood sugars three times daily Dx E11.65 Z79.4  . isosorbide mononitrate (IMDUR) 120 MG 24 hr tablet TAKE ONE (1) TABLET EACH DAY  . lisinopril (PRINIVIL,ZESTRIL) 40 MG tablet Take 1 Tablet by mouth once  daily  . meclizine (ANTIVERT) 25 MG tablet Take 1 tablet (25 mg total) by mouth 3 (three) times daily as needed for dizziness.  . metFORMIN (GLUCOPHAGE XR) 500 MG 24 hr tablet Take 1 tablet (500 mg total) by mouth daily with breakfast.  . nitroGLYCERIN (NITROSTAT) 0.4 MG SL tablet DISSOLVE 1 TAB UNDER TOUNGE FOR CHEST PAIN. MAY REPEAT EVERY 5 MINUTES FOR 3 DOSES. IF NO RELIEF CALL 911 OR GO TO ER  . NOVOLOG FLEXPEN 100 UNIT/ML FlexPen INJECT 35 UNITS SQ 3 TIMES DAILY  . potassium chloride SA (K-DUR,KLOR-CON) 20 MEQ tablet Take 40 mEq by mouth daily.   . rosuvastatin (CRESTOR) 40 MG tablet TAKE ONE (1) TABLET EACH DAY  . TOUJEO SOLOSTAR 300 UNIT/ML SOPN INJECT 50-60 UNITS SQ DAILY  . vitamin B-12 (CYANOCOBALAMIN) 1000 MCG tablet Take 1,000 mcg by mouth daily.  . Vitamin D, Ergocalciferol, (DRISDOL) 50000 units CAPS capsule TAKE 1 CAPSULE EVERY 7 DAYS  . zolpidem (AMBIEN) 10 MG tablet Take 0.5 tablets (5 mg total) by mouth at bedtime as needed.   No facility-administered encounter medications on file as of 04/23/2018.       Review of Systems  Constitutional: Positive for fatigue.  HENT: Negative.   Eyes: Negative.   Respiratory: Negative.   Cardiovascular: Negative.   Gastrointestinal: Negative.   Endocrine: Negative.   Genitourinary: Negative.   Musculoskeletal: Negative.   Skin: Negative.   Allergic/Immunologic: Negative.   Neurological: Negative.   Hematological: Negative.   Psychiatric/Behavioral: Negative.        Objective:   Physical Exam  Constitutional: She is oriented to person, place, and time. She appears well-developed and well-nourished. No distress.  The patient is calm pleasant and relaxed and in no acute distress.  HENT:  Head: Normocephalic and atraumatic.  Right Ear: External ear normal.  Left Ear: External ear normal.  Nose: Nose normal.  Mouth/Throat: Oropharynx is clear and moist. No oropharyngeal exudate.  Eyes: Pupils are equal, round, and reactive to  light. Conjunctivae and EOM are normal. Right eye exhibits no discharge. Left eye exhibits no discharge. No scleral icterus.  Neck: Normal range of motion. Neck supple. No thyromegaly present.  No bruits thyromegaly or anterior cervical adenopathy  Cardiovascular: Normal rate, regular rhythm and normal heart sounds.  No murmur heard. Heart has a regular rate and rhythm at 84/min.  Distal pulses were difficult to palpate.  Pulmonary/Chest: Effort normal and breath sounds normal. No respiratory distress. She has no wheezes. She has no rales. She exhibits no tenderness.  Abdominal: Soft. Bowel sounds are normal. She exhibits no mass. There is no tenderness.  No liver or spleen enlargement no epigastric or abdominal or suprapubic tenderness.  No masses and no bruits.  Musculoskeletal: Normal range of motion. She exhibits no edema or tenderness.  Lymphadenopathy:    She has no cervical adenopathy.  Neurological: She is alert and oriented to person, place, and time. She has normal reflexes. No cranial nerve deficit.  Skin: Skin is warm and dry. No rash noted. No erythema.  Dry skin over a lot of her body including her upper back and both feet.  Psychiatric: She has a normal mood and affect. Her behavior is normal. Judgment and thought content normal.  The patient's mood affect and behavior all appear normal.  Nursing note and vitals reviewed.  BP 111/68 (BP Location: Left Arm)   Pulse 86   Temp 98.2 F (36.8 C) (Oral)   Ht 5' 6"  (1.676 m)   Wt 180 lb (81.6 kg)   BMI 29.05 kg/m         Assessment & Plan:  1. Type 2 diabetes mellitus with hyperglycemia, with long-term current use of insulin ([place]) -Patient has a freestyle libre and is able to check blood sugars more often.  Blood sugar this morning at home was 289 - CBC with Differential/Platelet - Bayer DCA Hb A1c Waived  2. Pure hypercholesterolemia -Continue current treatment pending results of lab work -Continue with as aggressive  therapeutic lifestyle changes as possible - CBC with Differential/Platelet - Lipid panel  3. Low serum vitamin D no complaints today with reflux - CBC with Differential/Platelet - VITAMIN D 25 Hydroxy (Vit-D Deficiency, Fractures)  4. Gastroesophageal reflux disease, esophagitis presence not specified - - CBC with Differential/Platelet - Hepatic function panel  5. Essential hypertension, benign -Watch sodium intake and try to get more exercise and lose weight - BMP8+EGFR - CBC with Differential/Platelet - Hepatic function panel  6. Other fatigue - Thyroid Panel With TSH -Check B12 level also  7. Carotid artery disease, unspecified laterality (H[facility]) -No bruits audible today.  8. Obesity (BMI 30.0-34.9) -The patient understands the importance of exercise and diet and weight control.  9. DM type 2 with diabetic dyslipidemia ([place]) -Continue current treatment pending results of lab work  10. Atherosclerosis of native artery of both lower extremities with intermittent claudication ([place]) -The patient continues to have diminished pulses in both feet.  11. Peripheral vascular insufficiency ([facility]) -Diminished pulses both feet  12. Type 2 diabetes mellitus with other diabetic kidney complication, with long-term current use of insulin ([place]) -Continue aggressive therapeutic lifestyle changes and current insulin treatment pending results of lab work  Meds ordered this encounter  Medications  . Vitamin D, Ergocalciferol, (DRISDOL) 50000 units CAPS capsule    Sig: TAKE 1 CAPSULE EVERY 7 DAYS    Dispense:  12 capsule    Refill:  3  . rosuvastatin (CRESTOR) 40 MG tablet    Sig: TAKE ONE (1) TABLET EACH DAY    Dispense:  90 tablet    Refill:  3  . potassium chloride SA (K-DUR,KLOR-CON) 20 MEQ tablet    Sig: Take 2 tablets (40 mEq total) by mouth daily.    Dispense:  180 tablet    Refill:  3  . metFORMIN (GLUCOPHAGE XR) 500 MG 24 hr tablet    Sig: Take 1 tablet (500 mg total) by  mouth daily with breakfast.    Dispense:  90 tablet    Refill:  3  . lisinopril (PRINIVIL,ZESTRIL) 40 MG tablet    Sig: Take 1 tablet (40 mg total) by mouth daily.    Dispense:  90 tablet    Refill:  3  . isosorbide mononitrate (IMDUR) 120 MG 24 hr tablet    Sig: TAKE ONE (1) TABLET EACH DAY    Dispense:  90 tablet    Refill:  3  . furosemide (LASIX) 40 MG tablet  Sig: TAKE ONE (1) TABLET EACH DAY    Dispense:  90 tablet    Refill:  3  . citalopram (CELEXA) 20 MG tablet    Sig: TAKE ONE (1) TABLET EACH DAY    Dispense:  90 tablet    Refill:  3  . atenolol (TENORMIN) 50 MG tablet    Sig: Take 1 tablet (50 mg total) by mouth 2 (two) times daily.    Dispense:  180 tablet    Refill:  3  . amLODipine (NORVASC) 5 MG tablet    Sig: TAKE ONE (1) TABLET EACH DAY    Dispense:  90 tablet    Refill:  3  . zolpidem (AMBIEN) 10 MG tablet    Sig: Take 0.5 tablets (5 mg total) by mouth at bedtime as needed.    Dispense:  30 tablet    Refill:  4  . ALPRAZolam (XANAX) 0.5 MG tablet    Sig: TAKE ONE TABLET 3 TIMES A DAY AS NEEDED.    Dispense:  90 tablet    Refill:  4   Patient Instructions                       Medicare Annual Wellness Visit  [facility] and the medical providers at [facility] strive to bring you the best medical care.  In doing so we not only want to address your current medical conditions and concerns but also to detect new conditions early and prevent illness, disease and health-related problems.    Medicare offers a yearly Wellness Visit which allows our clinical staff to assess your need for preventative services including immunizations, lifestyle education, counseling to decrease risk of preventable diseases and screening for fall risk and other medical concerns.    This visit is provided free of charge (no copay) for all Medicare recipients. The clinical pharmacists at [facility] have begun to conduct these Wellness  Visits which will also include a thorough review of all your medications.    As you primary medical provider recommend that you make an appointment for your Annual Wellness Visit if you have not done so already this year.  You may set up this appointment before you leave today or you may call back [phone]) and schedule an appointment.  Please make sure when you call that you mention that you are scheduling your Annual Wellness Visit with the clinical pharmacist so that the appointment may be made for the proper length of time.     Continue current medications. Continue good therapeutic lifestyle changes which include good diet and exercise. Fall precautions discussed with patient. If an FOBT was given today- please return it to our front desk. If you are over 69 years old - you may need Prevnar 39 or the adult Pneumonia vaccine.  **Flu shots are available--- please call and schedule a FLU-CLINIC appointment**  After your visit with [name] today you will receive a survey in the mail or online from [employer] regarding your care with [name]. Please take a moment to fill this out. Your feedback is very important to [name] as you can help [name] better understand your patient needs as well as improve your experience and satisfaction. WE CARE ABOUT YOU!!!   Please schedule yourself with the ophthalmologist to get an eye exam and make sure we get a copy of that report Please schedule and get your mammogram Continue to monitor blood sugars closely This summer drink plenty  of water and stay well-hydrated Follow-up with cardiologist and orthopedist as planned Please return the FOBT card   Arrie Senate MD

## 2018-04-23 NOTE — Addendum Note (Signed)
Addended by: Zannie Cove on: 04/23/2018 09:52 AM   Modules accepted: Orders

## 2018-04-23 NOTE — Patient Instructions (Addendum)
Medicare Annual Wellness Visit  [facility] and the medical providers at [facility] strive to bring you the best medical care.  In doing so we not only want to address your current medical conditions and concerns but also to detect new conditions early and prevent illness, disease and health-related problems.    Medicare offers a yearly Wellness Visit which allows our clinical staff to assess your need for preventative services including immunizations, lifestyle education, counseling to decrease risk of preventable diseases and screening for fall risk and other medical concerns.    This visit is provided free of charge (no copay) for all Medicare recipients. The clinical pharmacists at [facility] have begun to conduct these Wellness Visits which will also include a thorough review of all your medications.    As you primary medical provider recommend that you make an appointment for your Annual Wellness Visit if you have not done so already this year.  You may set up this appointment before you leave today or you may call back [phone]) and schedule an appointment.  Please make sure when you call that you mention that you are scheduling your Annual Wellness Visit with the clinical pharmacist so that the appointment may be made for the proper length of time.     Continue current medications. Continue good therapeutic lifestyle changes which include good diet and exercise. Fall precautions discussed with patient. If an FOBT was given today- please return it to our front desk. If you are over 38 years old - you may need Prevnar 70 or the adult Pneumonia vaccine.  **Flu shots are available--- please call and schedule a FLU-CLINIC appointment**  After your visit with [name] today you will receive a survey in the mail or online from [employer] regarding your care with [name]. Please take a moment to fill this out. Your feedback is very  important to [name] as you can help [name] better understand your patient needs as well as improve your experience and satisfaction. WE CARE ABOUT YOU!!!   Please schedule yourself with the ophthalmologist to get an eye exam and make sure we get a copy of that report Please schedule and get your mammogram Continue to monitor blood sugars closely This summer drink plenty of water and stay well-hydrated Follow-up with cardiologist and orthopedist as planned Please return the FOBT card

## 2018-04-24 ENCOUNTER — Other Ambulatory Visit: Payer: PPO

## 2018-04-24 DIAGNOSIS — Z1212 Encounter for screening for malignant neoplasm of rectum: Secondary | ICD-10-CM | POA: Diagnosis not present

## 2018-04-24 LAB — THYROID PANEL WITH TSH
Free Thyroxine Index: 2.2 (ref 1.2–4.9)
T3 UPTAKE RATIO: 28 % (ref 24–39)
T4 TOTAL: 7.7 ug/dL (ref 4.5–12.0)
TSH: 1.95 u[IU]/mL (ref 0.450–4.500)

## 2018-04-24 LAB — BMP8+EGFR
BUN / CREAT RATIO: 15 (ref 12–28)
BUN: 17 mg/dL (ref 8–27)
CO2: 23 mmol/L (ref 20–29)
Calcium: 9.7 mg/dL (ref 8.7–10.3)
Chloride: 98 mmol/L (ref 96–106)
Creatinine, Ser: 1.17 mg/dL — ABNORMAL HIGH (ref 0.57–1.00)
GFR calc Af Amer: 55 mL/min/{1.73_m2} — ABNORMAL LOW (ref >59)
GFR, EST NON AFRICAN AMERICAN: 48 mL/min/{1.73_m2} — AB (ref >59)
GLUCOSE: 266 mg/dL — AB (ref 65–99)
POTASSIUM: 4.2 mmol/L (ref 3.5–5.2)
SODIUM: 138 mmol/L (ref 134–144)

## 2018-04-24 LAB — LIPID PANEL
CHOL/HDL RATIO: 2.9 ratio (ref 0.0–4.4)
Cholesterol, Total: 114 mg/dL (ref 100–199)
HDL: 40 mg/dL (ref >39)
LDL Calculated: 51 mg/dL (ref 0–99)
Triglycerides: 117 mg/dL (ref 0–149)
VLDL Cholesterol Cal: 23 mg/dL (ref 5–40)

## 2018-04-24 LAB — HEPATIC FUNCTION PANEL
ALT: 9 IU/L (ref 0–32)
AST: 15 IU/L (ref 0–40)
Albumin: 4.4 g/dL (ref 3.6–4.8)
Alkaline Phosphatase: 54 IU/L (ref 39–117)
BILIRUBIN TOTAL: 0.5 mg/dL (ref 0.0–1.2)
Bilirubin, Direct: 0.17 mg/dL (ref 0.00–0.40)
Total Protein: 7.1 g/dL (ref 6.0–8.5)

## 2018-04-24 LAB — CBC WITH DIFFERENTIAL/PLATELET
BASOS: 0 %
Basophils Absolute: 0 10*3/uL (ref 0.0–0.2)
EOS (ABSOLUTE): 0.1 10*3/uL (ref 0.0–0.4)
EOS: 2 %
HEMATOCRIT: 41.2 % (ref 34.0–46.6)
Hemoglobin: 13.5 g/dL (ref 11.1–15.9)
Immature Grans (Abs): 0 10*3/uL (ref 0.0–0.1)
Immature Granulocytes: 0 %
LYMPHS ABS: 2.5 10*3/uL (ref 0.7–3.1)
Lymphs: 32 %
MCH: 30.3 pg (ref 26.6–33.0)
MCHC: 32.8 g/dL (ref 31.5–35.7)
MCV: 92 fL (ref 79–97)
MONOS ABS: 0.4 10*3/uL (ref 0.1–0.9)
Monocytes: 6 %
Neutrophils Absolute: 4.6 10*3/uL (ref 1.4–7.0)
Neutrophils: 60 %
PLATELETS: 199 10*3/uL (ref 150–450)
RBC: 4.46 x10E6/uL (ref 3.77–5.28)
RDW: 13 % (ref 12.3–15.4)
WBC: 7.7 10*3/uL (ref 3.4–10.8)

## 2018-04-24 LAB — VITAMIN D 25 HYDROXY (VIT D DEFICIENCY, FRACTURES): VIT D 25 HYDROXY: 42.7 ng/mL (ref 30.0–100.0)

## 2018-04-24 LAB — VITAMIN B12: VITAMIN B 12: 224 pg/mL — AB (ref 232–1245)

## 2018-04-25 ENCOUNTER — Ambulatory Visit (INDEPENDENT_AMBULATORY_CARE_PROVIDER_SITE_OTHER): Payer: PPO | Admitting: *Deleted

## 2018-04-25 DIAGNOSIS — E538 Deficiency of other specified B group vitamins: Secondary | ICD-10-CM | POA: Diagnosis not present

## 2018-04-25 MED ORDER — CYANOCOBALAMIN 1000 MCG/ML IJ SOLN
1000.0000 ug | INTRAMUSCULAR | Status: AC
  Administered 2018-04-25 – 2019-02-20 (×6): 1000 ug via INTRAMUSCULAR

## 2018-04-25 NOTE — Progress Notes (Signed)
Pt given B12 injection IM left deltoid and tolerated well. °

## 2018-04-26 LAB — FECAL OCCULT BLOOD, IMMUNOCHEMICAL: FECAL OCCULT BLD: NEGATIVE

## 2018-05-07 ENCOUNTER — Ambulatory Visit (INDEPENDENT_AMBULATORY_CARE_PROVIDER_SITE_OTHER): Payer: PPO | Admitting: Family Medicine

## 2018-05-07 ENCOUNTER — Encounter: Payer: Self-pay | Admitting: Family Medicine

## 2018-05-07 VITALS — BP 133/74 | HR 73 | Temp 98.7°F | Ht 66.0 in | Wt 182.8 lb

## 2018-05-07 DIAGNOSIS — M1712 Unilateral primary osteoarthritis, left knee: Secondary | ICD-10-CM

## 2018-05-07 MED ORDER — METHYLPREDNISOLONE ACETATE 80 MG/ML IJ SUSP
80.0000 mg | Freq: Once | INTRAMUSCULAR | Status: AC
  Administered 2018-05-07: 80 mg via INTRAMUSCULAR

## 2018-05-07 NOTE — Progress Notes (Signed)
BP 133/74   Pulse 73   Temp 98.7 F (37.1 C) (Oral)   Ht 5' 6" (1.676 m)   Wt 182 lb 12.8 oz (82.9 kg)   BMI 29.50 kg/m    Subjective:    Patient ID: [name], female    DOB: [birth year], 67 y.o.   MRN: [id number]  HPI: [name] is a 67 y.o. female presenting on 05/07/2018 for Knee Pain (left x 3 days- swelling. Patient goes to [facility] and states that she has arthritis and bone spurs. Needing surgery- requesting knee injection)   HPI Left knee pain Patient comes in complaining of left knee pain that has been going on for the past few days.  She says is been swelling over the past few days.  She has seen orthopedic before and has been diagnosed with arthritis and bone spurs in both knees.  She has had injections in both knees previously but most recently she had a left knee injected about 4 months ago she says.  The most recent note that we see from orthopedic was from March and it was a right knee injection.  She said the left one just did not last as long and that is why she is coming in today but the right one is still doing great for her.  She denies any fevers or chills or redness or warmth.  She denies any specific trauma or movement that made it worse but is just been gradually getting worse.  Relevant past medical, surgical, family and social history reviewed and updated as indicated. Interim medical history since our last visit reviewed. Allergies and medications reviewed and updated.  Review of Systems  Constitutional: Negative for chills and fever.  Eyes: Negative for visual disturbance.  Respiratory: Negative for chest tightness and shortness of breath.   Cardiovascular: Negative for chest pain and leg swelling.  Genitourinary: Negative for difficulty urinating and dysuria.  Musculoskeletal: Positive for arthralgias and joint swelling. Negative for back pain and gait problem.  Skin: Negative for rash.  Neurological: Negative for light-headedness  and headaches.  Psychiatric/Behavioral: Negative for agitation and behavioral problems.  All other systems reviewed and are negative.   Per HPI unless specifically indicated above   Allergies as of 05/07/2018      Reactions   Iohexol     Desc: pt had syncopal episode with nausea post IV CM late 1990's,  pt has had prednisone prep with heart caths x 2 without problem  kdean 04/16/07, Onset Date: [id number]   Ticlid [ticlopidine Hcl] Nausea And Vomiting   Codeine Nausea And Vomiting, Palpitations      Medication List        Accurate as of 05/07/18 10:58 AM. Always use your most recent med list.          ALPRAZolam 0.5 MG tablet Commonly known as:  XANAX TAKE ONE TABLET 3 TIMES A DAY AS NEEDED.   amLODipine 5 MG tablet Commonly known as:  NORVASC TAKE ONE (1) TABLET EACH DAY   aspirin 325 MG tablet Take 325 mg by mouth daily.   atenolol 50 MG tablet Commonly known as:  TENORMIN Take 1 tablet (50 mg total) by mouth 2 (two) times daily.   Blood Glucose Monitor System w/Device Kit Patient checks glucose TID; dispense strips and lancets also, DX: E11.65, Z79.4   citalopram 20 MG tablet Commonly known as:  CELEXA TAKE ONE (1) TABLET EACH DAY   empagliflozin 10 MG Tabs tablet  Commonly known as:  JARDIANCE Take 10 mg by mouth daily.   FREESTYLE LIBRE 14 DAY SENSOR Misc USE TO CHECK BLOOD GLUCOSE CHANGE EVERY 14 DAYS   furosemide 40 MG tablet Commonly known as:  LASIX TAKE ONE (1) TABLET EACH DAY   glucose blood test strip Use to check blood sugars three times daily Dx E11.65 Z79.4   isosorbide mononitrate 120 MG 24 hr tablet Commonly known as:  IMDUR TAKE ONE (1) TABLET EACH DAY   lisinopril 40 MG tablet Commonly known as:  PRINIVIL,ZESTRIL Take 1 tablet (40 mg total) by mouth daily.   meclizine 25 MG tablet Commonly known as:  ANTIVERT Take 1 tablet (25 mg total) by mouth 3 (three) times daily as needed for dizziness.   nitroGLYCERIN 0.4 MG SL  tablet Commonly known as:  NITROSTAT DISSOLVE 1 TAB UNDER TOUNGE FOR CHEST PAIN. MAY REPEAT EVERY 5 MINUTES FOR 3 DOSES. IF NO RELIEF CALL 911 OR GO TO ER   NOVOLOG FLEXPEN 100 UNIT/ML FlexPen Generic drug:  insulin aspart INJECT 35 UNITS SQ 3 TIMES DAILY   potassium chloride SA 20 MEQ tablet Commonly known as:  K-DUR,KLOR-CON Take 2 tablets (40 mEq total) by mouth daily.   rosuvastatin 40 MG tablet Commonly known as:  CRESTOR TAKE ONE (1) TABLET EACH DAY   TOUJEO SOLOSTAR 300 UNIT/ML Sopn Generic drug:  Insulin Glargine INJECT 50-60 UNITS SQ DAILY   vitamin B-12 1000 MCG tablet Commonly known as:  CYANOCOBALAMIN Take 1,000 mcg by mouth daily.   Vitamin D (Ergocalciferol) 50000 units Caps capsule Commonly known as:  DRISDOL TAKE 1 CAPSULE EVERY 7 DAYS   zolpidem 10 MG tablet Commonly known as:  AMBIEN Take 0.5 tablets (5 mg total) by mouth at bedtime as needed.          Objective:    BP 133/74   Pulse 73   Temp 98.7 F (37.1 C) (Oral)   Ht 5' 6" (1.676 m)   Wt 182 lb 12.8 oz (82.9 kg)   BMI 29.50 kg/m   Wt Readings from Last 3 Encounters:  05/07/18 182 lb 12.8 oz (82.9 kg)  04/23/18 180 lb (81.6 kg)  01/30/18 175 lb (79.4 kg)    Physical Exam  Constitutional: She is oriented to person, place, and time. She appears well-developed and well-nourished. No distress.  Eyes: Conjunctivae are normal.  Musculoskeletal: Normal range of motion. She exhibits no edema.       Left knee: She exhibits effusion. She exhibits no ecchymosis, no deformity, no erythema, normal alignment, no LCL laxity, normal patellar mobility, normal meniscus and no MCL laxity. Tenderness found. Medial joint line and lateral joint line tenderness noted.  Neurological: She is alert and oriented to person, place, and time. Coordination normal.  Skin: Skin is warm and dry. No rash noted. She is not diaphoretic.  Psychiatric: She has a normal mood and affect. Her behavior is normal.  Nursing note  and vitals reviewed.   Knee injection: Consent form signed. Risk factors of bleeding and infection discussed with patient and patient is agreeable towards injection. Patient prepped with Betadine. Lateral approach towards injection used. Injected 80 mg of Depo-Medrol and 1 mL of 2% lidocaine. Patient tolerated procedure well and no side effects from noted. Minimal to no bleeding. Simple bandage applied after.     Assessment & Plan:   Problem List Items Addressed This Visit    None    Visit Diagnoses    Primary osteoarthritis of left knee    -  Primary   Relevant Medications   methylPREDNISolone acetate (DEPO-MEDROL) injection 80 mg (Completed)       Follow up plan: Return if symptoms worsen or fail to improve.  Counseling provided for all of the vaccine components No orders of the defined types were placed in this encounter.   Caryl Pina, MD [facility] 05/07/2018, 10:58 AM

## 2018-05-27 ENCOUNTER — Ambulatory Visit (INDEPENDENT_AMBULATORY_CARE_PROVIDER_SITE_OTHER): Payer: PPO

## 2018-05-27 DIAGNOSIS — E538 Deficiency of other specified B group vitamins: Secondary | ICD-10-CM | POA: Diagnosis not present

## 2018-05-27 NOTE — Patient Instructions (Signed)
Cyanocobalamin, Vitamin B12 injection What is this medicine? CYANOCOBALAMIN (sye an oh koe BAL a min) is a man made form of vitamin B12. Vitamin B12 is used in the growth of healthy blood cells, nerve cells, and proteins in the body. It also helps with the metabolism of fats and carbohydrates. This medicine is used to treat people who can not absorb vitamin B12. This medicine may be used for other purposes; ask your health care provider or pharmacist if you have questions. COMMON BRAND NAME(S): B-12 Compliance Kit, B-12 Injection Kit, Cyomin, LA-12, Nutri-Twelve, Physicians EZ Use B-12, Primabalt What should I tell my health care provider before I take this medicine? They need to know if you have any of these conditions: -kidney disease -Leber's disease -megaloblastic anemia -an unusual or allergic reaction to cyanocobalamin, cobalt, other medicines, foods, dyes, or preservatives -pregnant or trying to get pregnant -breast-feeding How should I use this medicine? This medicine is injected into a muscle or deeply under the skin. It is usually given by a health care professional in a clinic or doctor's office. However, your doctor may teach you how to inject yourself. Follow all instructions. Talk to your pediatrician regarding the use of this medicine in children. Special care may be needed. Overdosage: If you think you have taken too much of this medicine contact a poison control center or emergency room at once. NOTE: This medicine is only for you. Do not share this medicine with others. What if I miss a dose? If you are given your dose at a clinic or doctor's office, call to reschedule your appointment. If you give your own injections and you miss a dose, take it as soon as you can. If it is almost time for your next dose, take only that dose. Do not take double or extra doses. What may interact with this medicine? -colchicine -heavy alcohol intake This list may not describe all possible  interactions. Give your health care provider a list of all the medicines, herbs, non-prescription drugs, or dietary supplements you use. Also tell them if you smoke, drink alcohol, or use illegal drugs. Some items may interact with your medicine. What should I watch for while using this medicine? Visit your doctor or health care professional regularly. You may need blood work done while you are taking this medicine. You may need to follow a special diet. Talk to your doctor. Limit your alcohol intake and avoid smoking to get the best benefit. What side effects may I notice from receiving this medicine? Side effects that you should report to your doctor or health care professional as soon as possible: -allergic reactions like skin rash, itching or hives, swelling of the face, lips, or tongue -blue tint to skin -chest tightness, pain -difficulty breathing, wheezing -dizziness -red, swollen painful area on the leg Side effects that usually do not require medical attention (report to your doctor or health care professional if they continue or are bothersome): -diarrhea -headache This list may not describe all possible side effects. Call your doctor for medical advice about side effects. You may report side effects to FDA at 1-800-FDA-1088. Where should I keep my medicine? Keep out of the reach of children. Store at room temperature between 15 and 30 degrees C (59 and 85 degrees F). Protect from light. Throw away any unused medicine after the expiration date. NOTE: This sheet is a summary. It may not cover all possible information. If you have questions about this medicine, talk to your doctor, pharmacist, or   health care provider.  2018 Elsevier/Gold Standard (2007-12-22 22:10:20)  

## 2018-05-27 NOTE — Progress Notes (Signed)
B12 injection to left deltoid, patient tolerated well

## 2018-06-18 ENCOUNTER — Ambulatory Visit (INDEPENDENT_AMBULATORY_CARE_PROVIDER_SITE_OTHER): Payer: PPO

## 2018-06-18 DIAGNOSIS — Z23 Encounter for immunization: Secondary | ICD-10-CM | POA: Diagnosis not present

## 2018-06-26 ENCOUNTER — Ambulatory Visit: Payer: PPO | Admitting: *Deleted

## 2018-06-26 DIAGNOSIS — E538 Deficiency of other specified B group vitamins: Secondary | ICD-10-CM

## 2018-06-26 NOTE — Progress Notes (Signed)
Vitamin b12 injection given and patient tolerated well.  

## 2018-07-28 ENCOUNTER — Ambulatory Visit (INDEPENDENT_AMBULATORY_CARE_PROVIDER_SITE_OTHER): Payer: PPO | Admitting: *Deleted

## 2018-07-28 DIAGNOSIS — E538 Deficiency of other specified B group vitamins: Secondary | ICD-10-CM

## 2018-07-28 NOTE — Progress Notes (Signed)
Pt given Cyanocobalamin inj Tolerated well 

## 2018-08-28 ENCOUNTER — Ambulatory Visit (INDEPENDENT_AMBULATORY_CARE_PROVIDER_SITE_OTHER): Payer: PPO | Admitting: *Deleted

## 2018-08-28 DIAGNOSIS — E538 Deficiency of other specified B group vitamins: Secondary | ICD-10-CM

## 2018-08-28 NOTE — Progress Notes (Signed)
Pt given cyanocobalamin inj Tolerated well 

## 2018-09-08 ENCOUNTER — Ambulatory Visit (INDEPENDENT_AMBULATORY_CARE_PROVIDER_SITE_OTHER): Payer: PPO | Admitting: Family Medicine

## 2018-09-08 ENCOUNTER — Encounter: Payer: Self-pay | Admitting: Family Medicine

## 2018-09-08 VITALS — BP 137/74 | HR 93 | Temp 97.6°F | Ht 66.0 in | Wt 184.0 lb

## 2018-09-08 DIAGNOSIS — Z794 Long term (current) use of insulin: Secondary | ICD-10-CM

## 2018-09-08 DIAGNOSIS — K219 Gastro-esophageal reflux disease without esophagitis: Secondary | ICD-10-CM

## 2018-09-08 DIAGNOSIS — I10 Essential (primary) hypertension: Secondary | ICD-10-CM

## 2018-09-08 DIAGNOSIS — E7800 Pure hypercholesterolemia, unspecified: Secondary | ICD-10-CM | POA: Diagnosis not present

## 2018-09-08 DIAGNOSIS — E1165 Type 2 diabetes mellitus with hyperglycemia: Secondary | ICD-10-CM | POA: Diagnosis not present

## 2018-09-08 DIAGNOSIS — R7989 Other specified abnormal findings of blood chemistry: Secondary | ICD-10-CM | POA: Diagnosis not present

## 2018-09-08 LAB — BAYER DCA HB A1C WAIVED: HB A1C (BAYER DCA - WAIVED): 8.8 % — ABNORMAL HIGH (ref <7.0)

## 2018-09-08 MED ORDER — ATENOLOL 50 MG PO TABS: 50.0000 mg | ORAL_TABLET | Freq: Two times a day (BID) | ORAL | 3 refills | Status: DC

## 2018-09-08 MED ORDER — ISOSORBIDE MONONITRATE ER 120 MG PO TB24: ORAL_TABLET | ORAL | 3 refills | Status: DC

## 2018-09-08 MED ORDER — ZOLPIDEM TARTRATE 10 MG PO TABS: 5.0000 mg | ORAL_TABLET | Freq: Every evening | ORAL | 5 refills | Status: DC | PRN

## 2018-09-08 MED ORDER — FUROSEMIDE 40 MG PO TABS: ORAL_TABLET | ORAL | 3 refills | Status: DC

## 2018-09-08 MED ORDER — POTASSIUM CHLORIDE CRYS ER 20 MEQ PO TBCR: 40.0000 meq | EXTENDED_RELEASE_TABLET | Freq: Every day | ORAL | 3 refills | Status: DC

## 2018-09-08 MED ORDER — LISINOPRIL 40 MG PO TABS: 40.0000 mg | ORAL_TABLET | Freq: Every day | ORAL | 3 refills | Status: DC

## 2018-09-08 MED ORDER — VITAMIN D (ERGOCALCIFEROL) 1.25 MG (50000 UNIT) PO CAPS: ORAL_CAPSULE | ORAL | 3 refills | Status: DC

## 2018-09-08 MED ORDER — AMLODIPINE BESYLATE 5 MG PO TABS: ORAL_TABLET | ORAL | 3 refills | Status: DC

## 2018-09-08 MED ORDER — ROSUVASTATIN CALCIUM 40 MG PO TABS: ORAL_TABLET | ORAL | 3 refills | Status: DC

## 2018-09-08 MED ORDER — ALPRAZOLAM 0.5 MG PO TABS: ORAL_TABLET | ORAL | 5 refills | Status: DC

## 2018-09-08 MED ORDER — CITALOPRAM HYDROBROMIDE 20 MG PO TABS: ORAL_TABLET | ORAL | 3 refills | Status: DC

## 2018-09-08 NOTE — Progress Notes (Signed)
Subjective:    Patient ID: [name], female    DOB: [birth year], 67 y.o.   MRN: [id number]  HPI Pt here for follow up and management of chronic medical problems which includes diabetes and hypertension. She is taking medication regularly.  The patient is doing well overall and is requesting some samples and we are refilling several of her medicines.  She is followed regularly by the cardiologist.  She was supposed to be seeing the endocrinologist because of difficult to control blood sugar.  The patient has no specific complaints today.  Her BMI is 29 and her vital signs are stable, patient is pleasant and alert with no particular complaints as usual.  She has not had an eye exam.  She says her visits to see the cardiologist are if and when needed.    Patient Active Problem List   Diagnosis Date Noted  . Osteopenia after menopause 12/13/2017  . Difficulty speaking   . H/O prolonged Q-T interval on ECG 03/06/2017  . Abnormal cardiovascular stress test 03/06/2017  . Pre-op examination 03/06/2017  . Swelling of upper arm 07/04/2016  . Superficial bruising of arm, right, sequela 07/04/2016  . B12 deficiency 01/13/2016  . Facial droop 09/01/2015  . Stroke ([place]) 09/01/2015  . TIA (transient ischemic attack) 08/14/2014  . DM type 2 with diabetic dyslipidemia ([place]) 08/14/2014  . Cataract 02/08/2014  . Macular degeneration 02/08/2014  . Elevated d-dimer, neg VQ scan 01/08/2014  . Accelerating angina ([place]) 01/07/2014  . Chest pain 12/30/2013  . Generalized anxiety disorder 09/14/2013  . Claudication ([place]) 06/18/2013  . Diverticulosis of colon 01/01/2012  . Left lower quadrant pain 01/01/2012  . GERD (gastroesophageal reflux disease) 01/01/2012  . Constipation 01/01/2012  . Hypokalemia 08/24/2011  . Uncontrolled type 2 diabetes mellitus with insulin therapy ([place]) 08/23/2011  . Essential hypertension 08/23/2011  . PALPITATIONS 02/10/2010  . Hyperlipidemia 05/10/2009  .  Depression with anxiety 05/10/2009  . Coronary artery disease involving native coronary artery of native heart with angina pectoris ([place]) 08/26/2008  . Carotid artery disease ([place]) 08/26/2008   Outpatient Encounter Medications as of 09/08/2018  Medication Sig  . ALPRAZolam (XANAX) 0.5 MG tablet TAKE ONE TABLET 3 TIMES A DAY AS NEEDED.  Marland Kitchen amLODipine (NORVASC) 5 MG tablet TAKE ONE (1) TABLET EACH DAY  . aspirin 325 MG tablet Take 325 mg by mouth daily.  Marland Kitchen atenolol (TENORMIN) 50 MG tablet Take 1 tablet (50 mg total) by mouth 2 (two) times daily.  . Blood Glucose Monitoring Suppl (BLOOD GLUCOSE MONITOR SYSTEM) w/Device KIT Patient checks glucose TID; dispense strips and lancets also, DX: E11.65, Z79.4  . citalopram (CELEXA) 20 MG tablet TAKE ONE (1) TABLET EACH DAY  . Continuous Blood Gluc Sensor (FREESTYLE LIBRE 14 DAY SENSOR) MISC USE TO CHECK BLOOD GLUCOSE CHANGE EVERY 14 DAYS  . empagliflozin (JARDIANCE) 10 MG TABS tablet Take 10 mg by mouth daily.  . furosemide (LASIX) 40 MG tablet TAKE ONE (1) TABLET EACH DAY  . glucose blood (IGLUCOSE TEST STRIPS) test strip Use to check blood sugars three times daily Dx E11.65 Z79.4  . isosorbide mononitrate (IMDUR) 120 MG 24 hr tablet TAKE ONE (1) TABLET EACH DAY  . lisinopril (PRINIVIL,ZESTRIL) 40 MG tablet Take 1 tablet (40 mg total) by mouth daily.  . meclizine (ANTIVERT) 25 MG tablet Take 1 tablet (25 mg total) by mouth 3 (three) times daily as needed for dizziness.  . nitroGLYCERIN (NITROSTAT) 0.4 MG SL tablet DISSOLVE 1 TAB UNDER  TOUNGE FOR CHEST PAIN. MAY REPEAT EVERY 5 MINUTES FOR 3 DOSES. IF NO RELIEF CALL 911 OR GO TO ER  . NOVOLOG FLEXPEN 100 UNIT/ML FlexPen INJECT 35 UNITS SQ 3 TIMES DAILY  . potassium chloride SA (K-DUR,KLOR-CON) 20 MEQ tablet Take 2 tablets (40 mEq total) by mouth daily.  . rosuvastatin (CRESTOR) 40 MG tablet TAKE ONE (1) TABLET EACH DAY  . TOUJEO SOLOSTAR 300 UNIT/ML SOPN INJECT 50-60 UNITS SQ DAILY  . vitamin B-12  (CYANOCOBALAMIN) 1000 MCG tablet Take 1,000 mcg by mouth daily.  . Vitamin D, Ergocalciferol, (DRISDOL) 50000 units CAPS capsule TAKE 1 CAPSULE EVERY 7 DAYS  . zolpidem (AMBIEN) 10 MG tablet Take 0.5 tablets (5 mg total) by mouth at bedtime as needed.   Facility-Administered Encounter Medications as of 09/08/2018  Medication  . cyanocobalamin ((VITAMIN B-12)) injection 1,000 mcg      Review of Systems  Constitutional: Negative.   HENT: Negative.   Eyes: Negative.   Respiratory: Negative.   Cardiovascular: Negative.   Gastrointestinal: Negative.   Endocrine: Negative.   Genitourinary: Negative.   Musculoskeletal: Negative.   Skin: Negative.   Allergic/Immunologic: Negative.   Neurological: Negative.   Hematological: Negative.   Psychiatric/Behavioral: Negative.        Objective:   Physical Exam Vitals signs and nursing note reviewed.  Constitutional:      Appearance: Normal appearance. She is well-developed. She is obese.     Comments: The patient is pleasant and alert and in no acute distress.  HENT:     Head: Normocephalic and atraumatic.     Right Ear: Tympanic membrane, ear canal and external ear normal. There is no impacted cerumen.     Left Ear: Tympanic membrane, ear canal and external ear normal. There is no impacted cerumen.     Nose: Nose normal.     Mouth/Throat:     Mouth: Mucous membranes are moist.     Pharynx: Oropharynx is clear. No posterior oropharyngeal erythema.  Eyes:     General: No scleral icterus.       Right eye: No discharge.        Left eye: No discharge.     Extraocular Movements: Extraocular movements intact.     Conjunctiva/sclera: Conjunctivae normal.     Pupils: Pupils are equal, round, and reactive to light.     Comments: Patient is in need of eye exam and she was encouraged to get this from the doctor at [facility].  Neck:     Musculoskeletal: Normal range of motion and neck supple. No neck rigidity.     Thyroid: No thyromegaly.      Vascular: No carotid bruit or JVD.  Cardiovascular:     Rate and Rhythm: Normal rate and regular rhythm.     Pulses: Normal pulses.     Heart sounds: Normal heart sounds. No murmur. No friction rub. No gallop.   Pulmonary:     Effort: Pulmonary effort is normal.     Breath sounds: Normal breath sounds. No wheezing or rales.     Comments: Pulses were present but difficult to find. Abdominal:     General: Abdomen is flat. Bowel sounds are normal.     Palpations: Abdomen is soft. There is no mass.     Tenderness: There is no abdominal tenderness. There is no guarding.     Comments: No liver or spleen enlargement.  No epigastric tenderness.  No masses or organ enlargement or bruits.  Musculoskeletal: Normal range of motion.  General: No tenderness.     Right lower leg: No edema.     Left lower leg: No edema.  Lymphadenopathy:     Cervical: No cervical adenopathy.  Skin:    General: Skin is warm and dry.     Findings: No rash.     Comments: Dry skin on both feet  Neurological:     Mental Status: She is alert and oriented to person, place, and time.     Cranial Nerves: No cranial nerve deficit.     Deep Tendon Reflexes: Reflexes are normal and symmetric.     Comments: Mood affect and behavior are all normal for this patient  Psychiatric:        Mood and Affect: Mood normal.        Behavior: Behavior normal.        Thought Content: Thought content normal.        Judgment: Judgment normal.    BP 137/74 (BP Location: Left Arm)   Pulse 93   Temp 97.6 F (36.4 C) (Oral)   Ht _0  (1.676 m)   Wt 184 lb (83.5 kg)   BMI 29.70 kg/m         Assessment & Plan:  1. Type 2 diabetes mellitus with hyperglycemia, with long-term current use of insulin ([place]) -Continue current treatment pending results of lab work.  Continue with aggressive therapeutic lifestyle changes.  Get eye exam. - BMP8+EGFR - CBC with Differential/Platelet - Bayer DCA Hb A1c Waived  2. Pure  hypercholesterolemia -Continue with current treatment pending results of lab work - CBC with Differential/Platelet - Lipid panel  3. Gastroesophageal reflux disease, esophagitis presence not specified -No complaints today with reflux and patient will continue to watch her diet closely to avoid reflux symptoms. - CBC with Differential/Platelet - Hepatic function panel  4. Low serum vitamin D -Continue current treatment pending results of lab work - CBC with Differential/Platelet - VITAMIN D 25 Hydroxy (Vit-D Deficiency, Fractures)  5. Essential hypertension, benign -Blood pressure is good today and she will continue with current treatment - BMP8+EGFR - CBC with Differential/Platelet - Hepatic function panel  Meds ordered this encounter  Medications  . Vitamin D, Ergocalciferol, (DRISDOL) 1.25 MG (50000 UT) CAPS capsule    Sig: TAKE 1 CAPSULE EVERY 7 DAYS    Dispense:  12 capsule    Refill:  3  . rosuvastatin (CRESTOR) 40 MG tablet    Sig: TAKE ONE (1) TABLET EACH DAY    Dispense:  90 tablet    Refill:  3  . potassium chloride SA (K-DUR,KLOR-CON) 20 MEQ tablet    Sig: Take 2 tablets (40 mEq total) by mouth daily.    Dispense:  180 tablet    Refill:  3  . lisinopril (PRINIVIL,ZESTRIL) 40 MG tablet    Sig: Take 1 tablet (40 mg total) by mouth daily.    Dispense:  90 tablet    Refill:  3  . isosorbide mononitrate (IMDUR) 120 MG 24 hr tablet    Sig: TAKE ONE (1) TABLET EACH DAY    Dispense:  90 tablet    Refill:  3  . furosemide (LASIX) 40 MG tablet    Sig: TAKE ONE (1) TABLET EACH DAY    Dispense:  90 tablet    Refill:  3  . citalopram (CELEXA) 20 MG tablet    Sig: TAKE ONE (1) TABLET EACH DAY    Dispense:  90 tablet    Refill:  3  .  atenolol (TENORMIN) 50 MG tablet    Sig: Take 1 tablet (50 mg total) by mouth 2 (two) times daily.    Dispense:  180 tablet    Refill:  3  . amLODipine (NORVASC) 5 MG tablet    Sig: TAKE ONE (1) TABLET EACH DAY    Dispense:  90 tablet      Refill:  3  . ALPRAZolam (XANAX) 0.5 MG tablet    Sig: TAKE ONE TABLET 3 TIMES A DAY AS NEEDED.    Dispense:  90 tablet    Refill:  5  . zolpidem (AMBIEN) 10 MG tablet    Sig: Take 0.5 tablets (5 mg total) by mouth at bedtime as needed.    Dispense:  30 tablet    Refill:  5   Patient Instructions                       Medicare Annual Wellness Visit  [facility] and the medical providers at [facility] strive to bring you the best medical care.  In doing so we not only want to address your current medical conditions and concerns but also to detect new conditions early and prevent illness, disease and health-related problems.    Medicare offers a yearly Wellness Visit which allows our clinical staff to assess your need for preventative services including immunizations, lifestyle education, counseling to decrease risk of preventable diseases and screening for fall risk and other medical concerns.    This visit is provided free of charge (no copay) for all Medicare recipients. The clinical pharmacists at [facility] have begun to conduct these Wellness Visits which will also include a thorough review of all your medications.    As you primary medical provider recommend that you make an appointment for your Annual Wellness Visit if you have not done so already this year.  You may set up this appointment before you leave today or you may call back ([phone]) and schedule an appointment.  Please make sure when you call that you mention that you are scheduling your Annual Wellness Visit with the clinical pharmacist so that the appointment may be made for the proper length of time.     Continue current medications. Continue good therapeutic lifestyle changes which include good diet and exercise. Fall precautions discussed with patient. If an FOBT was given today- please return it to our front desk. If you are over 10 years old - you may need Prevnar  46 or the adult Pneumonia vaccine.  **Flu shots are available--- please call and schedule a FLU-CLINIC appointment**  After your visit with [name] today you will receive a survey in the mail or online from [employer] regarding your care with [name]. Please take a moment to fill this out. Your feedback is very important to [name] as you can help [name] better understand your patient needs as well as improve your experience and satisfaction. WE CARE ABOUT YOU!!!   The house as cool as possible Drink plenty of water and fluids and stay well-hydrated Do not put yourself at risk for falling and avoid climbing as much as possible Check blood sugars more regularly Get eye exam as this is past due Follow-up with specialist as needed Discussed the redundant skin above the right eye with the ophthalmologist to see if this would warrant surgery or not Use moisturizers on feet and check feet regularly  Arrie Senate MD

## 2018-09-08 NOTE — Patient Instructions (Addendum)
Medicare Annual Wellness Visit  [facility] and the medical providers at [facility] strive to bring you the best medical care.  In doing so we not only want to address your current medical conditions and concerns but also to detect new conditions early and prevent illness, disease and health-related problems.    Medicare offers a yearly Wellness Visit which allows our clinical staff to assess your need for preventative services including immunizations, lifestyle education, counseling to decrease risk of preventable diseases and screening for fall risk and other medical concerns.    This visit is provided free of charge (no copay) for all Medicare recipients. The clinical pharmacists at [facility] have begun to conduct these Wellness Visits which will also include a thorough review of all your medications.    As you primary medical provider recommend that you make an appointment for your Annual Wellness Visit if you have not done so already this year.  You may set up this appointment before you leave today or you may call back ([phone]) and schedule an appointment.  Please make sure when you call that you mention that you are scheduling your Annual Wellness Visit with the clinical pharmacist so that the appointment may be made for the proper length of time.     Continue current medications. Continue good therapeutic lifestyle changes which include good diet and exercise. Fall precautions discussed with patient. If an FOBT was given today- please return it to our front desk. If you are over 43 years old - you may need Prevnar 63 or the adult Pneumonia vaccine.  **Flu shots are available--- please call and schedule a FLU-CLINIC appointment**  After your visit with [name] today you will receive a survey in the mail or online from [employer] regarding your care with [name]. Please take a moment to fill this out. Your feedback is very  important to [name] as you can help [name] better understand your patient needs as well as improve your experience and satisfaction. WE CARE ABOUT YOU!!!   The house as cool as possible Drink plenty of water and fluids and stay well-hydrated Do not put yourself at risk for falling and avoid climbing as much as possible Check blood sugars more regularly Get eye exam as this is past due Follow-up with specialist as needed Discussed the redundant skin above the right eye with the ophthalmologist to see if this would warrant surgery or not Use moisturizers on feet and check feet regularly

## 2018-09-09 ENCOUNTER — Other Ambulatory Visit: Payer: Self-pay | Admitting: *Deleted

## 2018-09-09 DIAGNOSIS — E876 Hypokalemia: Secondary | ICD-10-CM

## 2018-09-09 LAB — CBC WITH DIFFERENTIAL/PLATELET
BASOS ABS: 0 10*3/uL (ref 0.0–0.2)
BASOS: 0 %
EOS (ABSOLUTE): 0.1 10*3/uL (ref 0.0–0.4)
Eos: 2 %
HEMOGLOBIN: 12.9 g/dL (ref 11.1–15.9)
Hematocrit: 39.3 % (ref 34.0–46.6)
IMMATURE GRANULOCYTES: 0 %
Immature Grans (Abs): 0 10*3/uL (ref 0.0–0.1)
Lymphocytes Absolute: 2.8 10*3/uL (ref 0.7–3.1)
Lymphs: 38 %
MCH: 29.7 pg (ref 26.6–33.0)
MCHC: 32.8 g/dL (ref 31.5–35.7)
MCV: 90 fL (ref 79–97)
MONOCYTES: 7 %
Monocytes Absolute: 0.5 10*3/uL (ref 0.1–0.9)
NEUTROS ABS: 4 10*3/uL (ref 1.4–7.0)
NEUTROS PCT: 53 %
PLATELETS: 210 10*3/uL (ref 150–450)
RBC: 4.35 x10E6/uL (ref 3.77–5.28)
RDW: 14 % (ref 12.3–15.4)
WBC: 7.5 10*3/uL (ref 3.4–10.8)

## 2018-09-09 LAB — BMP8+EGFR
BUN/Creatinine Ratio: 10 — ABNORMAL LOW (ref 12–28)
BUN: 9 mg/dL (ref 8–27)
CHLORIDE: 98 mmol/L (ref 96–106)
CO2: 24 mmol/L (ref 20–29)
Calcium: 9.5 mg/dL (ref 8.7–10.3)
Creatinine, Ser: 0.89 mg/dL (ref 0.57–1.00)
GFR, EST AFRICAN AMERICAN: 76 mL/min/{1.73_m2} (ref >59)
GFR, EST NON AFRICAN AMERICAN: 66 mL/min/{1.73_m2} (ref >59)
GLUCOSE: 79 mg/dL (ref 65–99)
POTASSIUM: 3.2 mmol/L — AB (ref 3.5–5.2)
Sodium: 145 mmol/L — ABNORMAL HIGH (ref 134–144)

## 2018-09-09 LAB — LIPID PANEL
CHOLESTEROL TOTAL: 116 mg/dL (ref 100–199)
Chol/HDL Ratio: 2.8 ratio (ref 0.0–4.4)
HDL: 42 mg/dL (ref >39)
LDL CALC: 57 mg/dL (ref 0–99)
Triglycerides: 87 mg/dL (ref 0–149)
VLDL Cholesterol Cal: 17 mg/dL (ref 5–40)

## 2018-09-09 LAB — HEPATIC FUNCTION PANEL
ALK PHOS: 60 IU/L (ref 39–117)
ALT: 14 IU/L (ref 0–32)
AST: 20 IU/L (ref 0–40)
Albumin: 4.2 g/dL (ref 3.6–4.8)
BILIRUBIN, DIRECT: 0.16 mg/dL (ref 0.00–0.40)
Bilirubin Total: 0.4 mg/dL (ref 0.0–1.2)
Total Protein: 6.8 g/dL (ref 6.0–8.5)

## 2018-09-09 LAB — VITAMIN D 25 HYDROXY (VIT D DEFICIENCY, FRACTURES): Vit D, 25-Hydroxy: 47.1 ng/mL (ref 30.0–100.0)

## 2018-09-22 ENCOUNTER — Other Ambulatory Visit: Payer: PPO

## 2018-09-22 DIAGNOSIS — E876 Hypokalemia: Secondary | ICD-10-CM

## 2018-09-23 LAB — BMP8+EGFR
BUN/Creatinine Ratio: 10 — ABNORMAL LOW (ref 12–28)
BUN: 10 mg/dL (ref 8–27)
CHLORIDE: 100 mmol/L (ref 96–106)
CO2: 23 mmol/L (ref 20–29)
Calcium: 9.1 mg/dL (ref 8.7–10.3)
Creatinine, Ser: 1.05 mg/dL — ABNORMAL HIGH (ref 0.57–1.00)
GFR calc Af Amer: 63 mL/min/{1.73_m2} (ref >59)
GFR calc non Af Amer: 54 mL/min/{1.73_m2} — ABNORMAL LOW (ref >59)
Glucose: 344 mg/dL — ABNORMAL HIGH (ref 65–99)
POTASSIUM: 4.6 mmol/L (ref 3.5–5.2)
Sodium: 140 mmol/L (ref 134–144)

## 2018-09-26 ENCOUNTER — Ambulatory Visit: Payer: PPO

## 2018-09-26 ENCOUNTER — Other Ambulatory Visit: Payer: Self-pay | Admitting: *Deleted

## 2018-09-26 MED ORDER — METFORMIN HCL 500 MG PO TABS: 500.0000 mg | ORAL_TABLET | Freq: Every day | ORAL | 1 refills | Status: DC

## 2018-10-28 ENCOUNTER — Other Ambulatory Visit: Payer: Self-pay | Admitting: Family Medicine

## 2018-11-25 IMAGING — CT CT HEAD W/O CM
3 series · 16 of 47 positions shown, 19 images · non-contrast
Comparison: 09/29/2016 CT head

CLINICAL DATA: 66 y/o F; left-sided weakness and left jaw
tightening.

EXAM:
CT HEAD WITHOUT CONTRAST
TECHNIQUE: Contiguous axial images were obtained from the base of the skull
through the vertex without intravenous contrast.

[Series 2: head wo · axial · 0.44mm/px · z∈[-8,+117]mm · 10 of 31 slices shown, 13 images]
[im 3/31  brain]
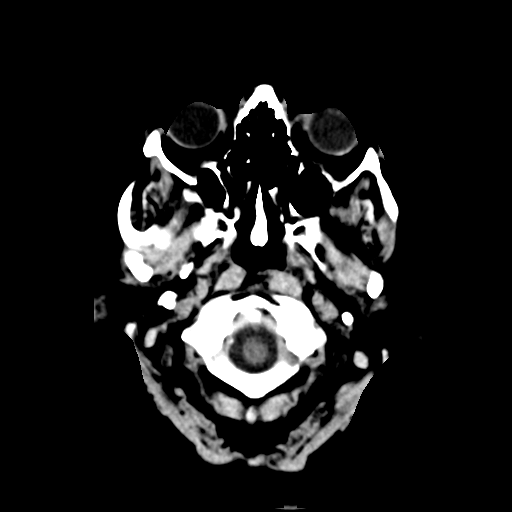
[im 3/31  bone]
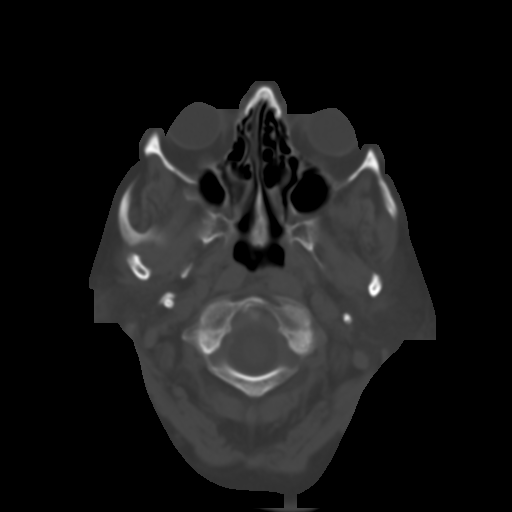
[im 6/31  brain]
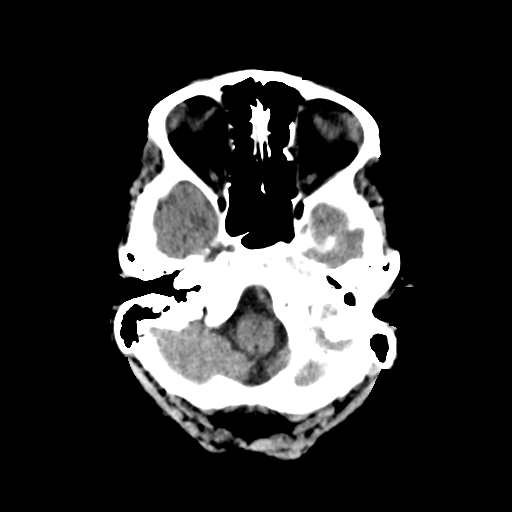
[im 9/31  brain]
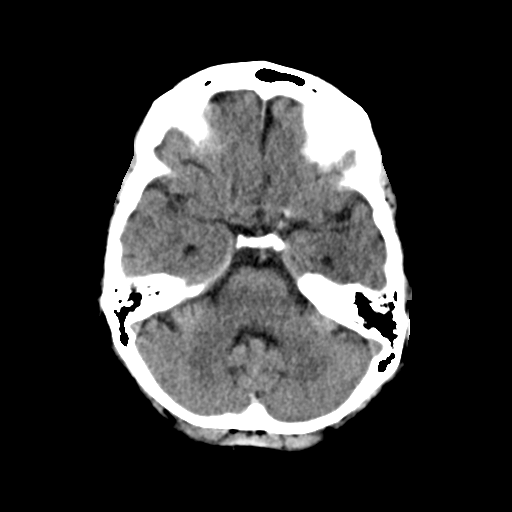
[im 11/31  brain]
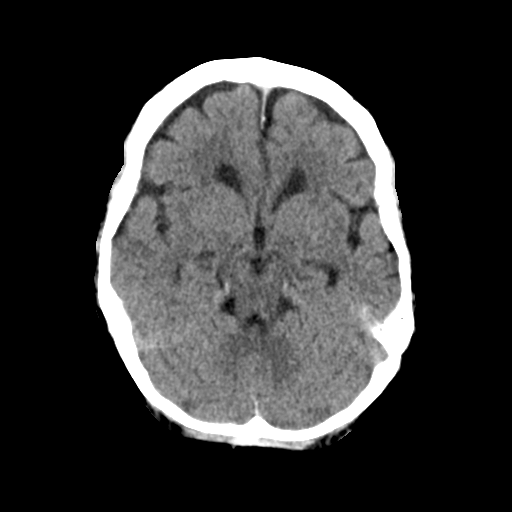
[im 14/31  brain]
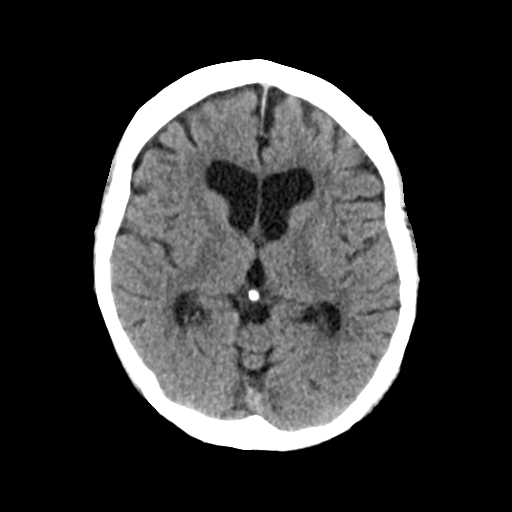
[im 14/31  bone]
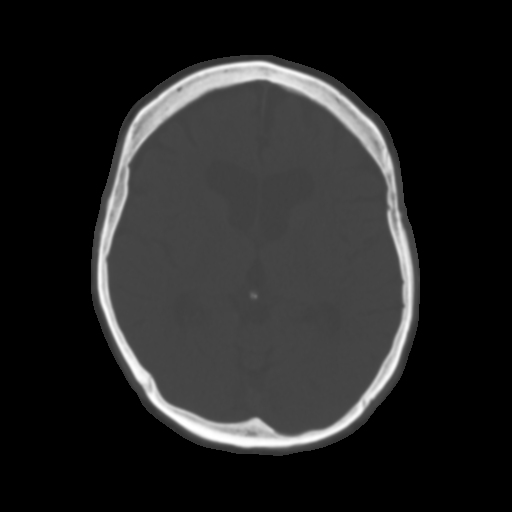
[im 17/31  brain]
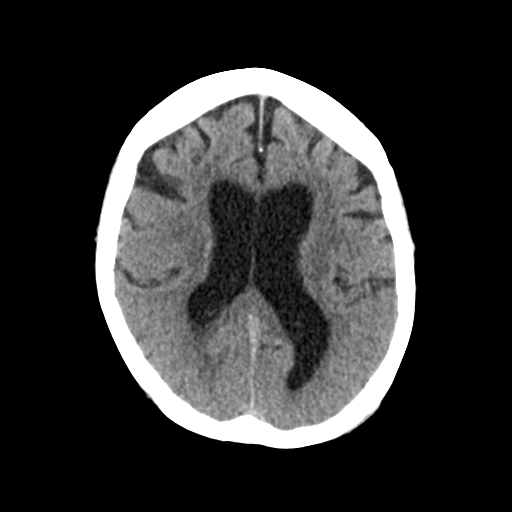
[im 20/31  brain]
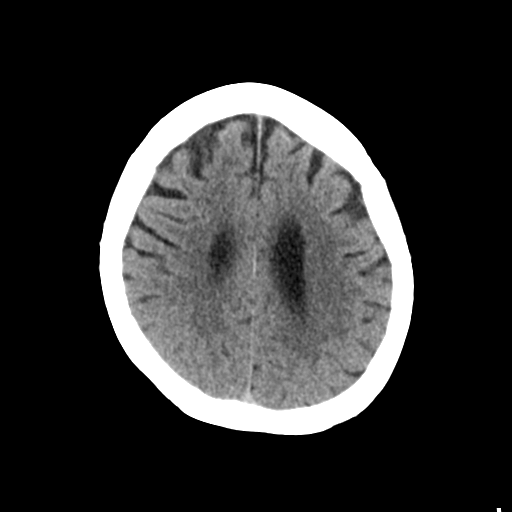
[im 23/31  brain]
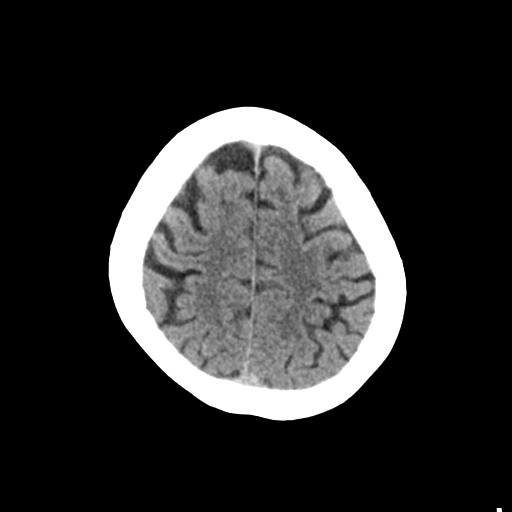
[im 25/31  brain]
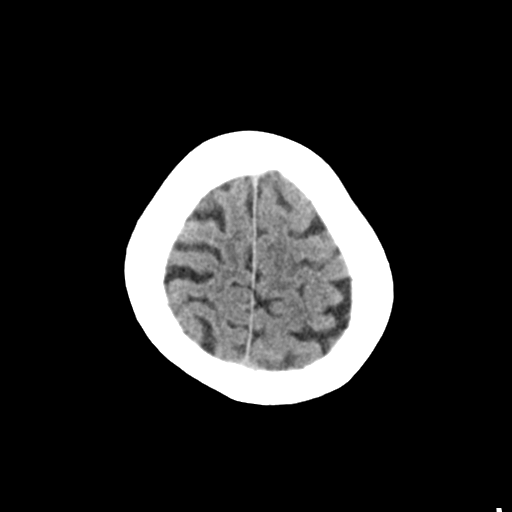
[im 25/31  bone]
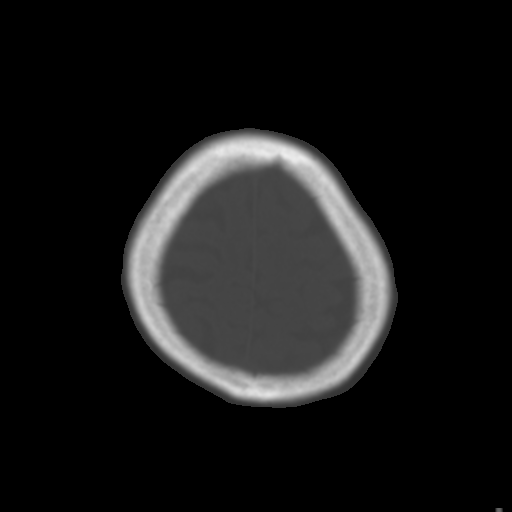
[im 28/31  brain]
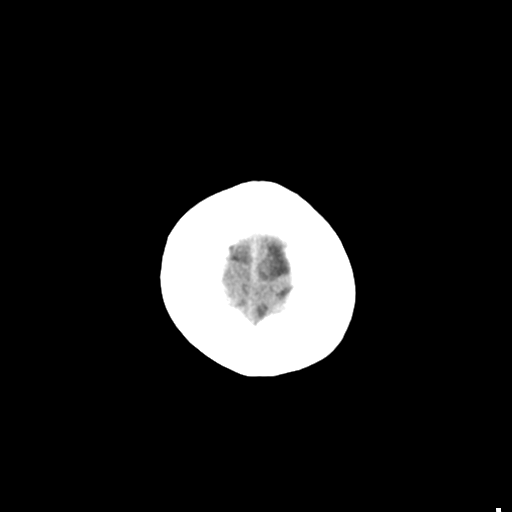

[Series 4: coronal soft tissue · coronal · 0.34mm/px · 3 of 63 slices shown]
[im 21/63  brain]
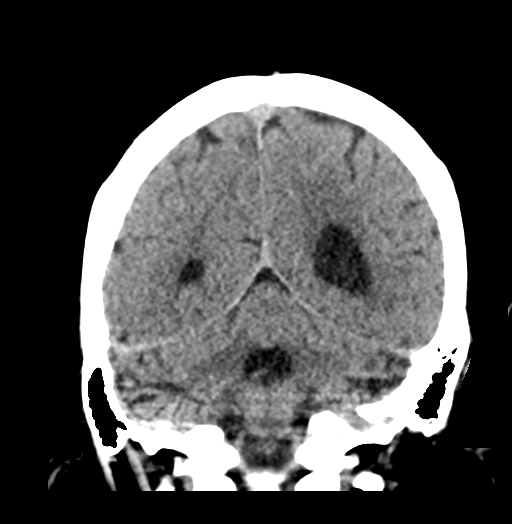
[im 28/63  brain]
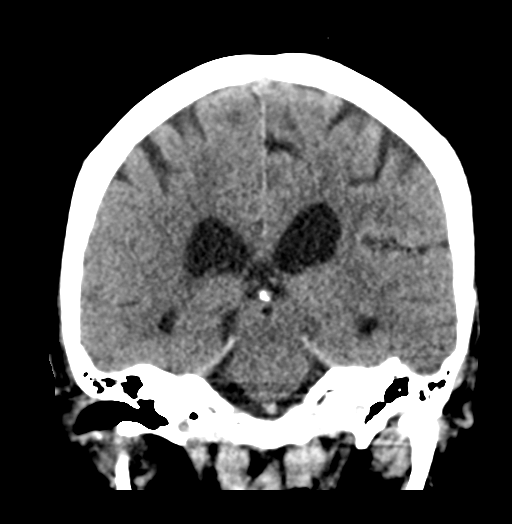
[im 35/63  brain]
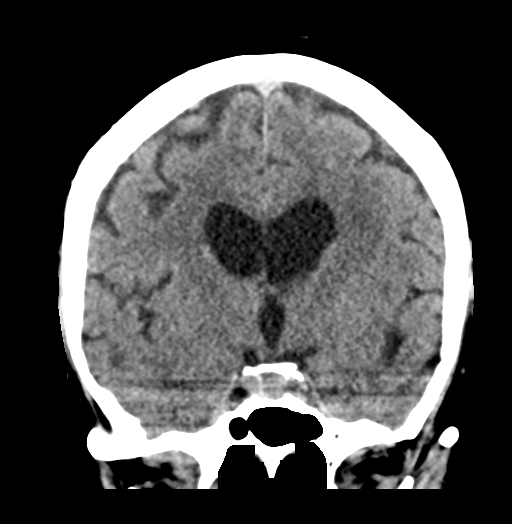

[Series 5: sagittal soft tissue · sagittal · 0.32mm/px · 3 of 62 slices shown]
[im 21/62  brain]
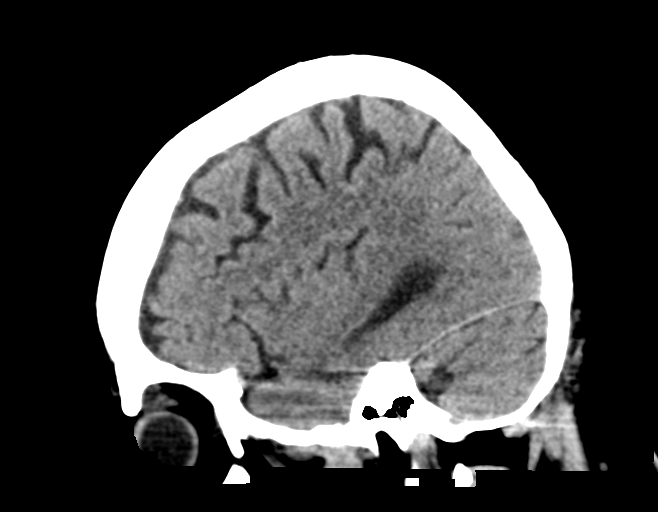
[im 31/62  brain]
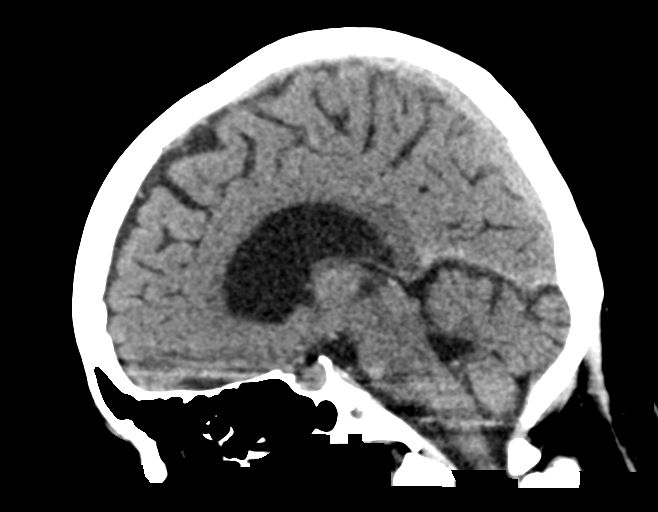
[im 41/62  brain]
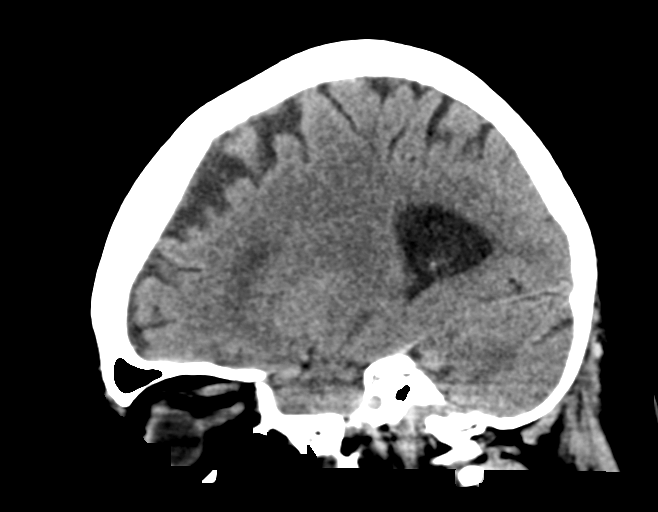

[16 of 47 positions shown; findings below may reference images not displayed]

FINDINGS: Brain: No evidence of acute infarction, hemorrhage, hydrocephalus,
extra-axial collection or mass lesion/mass effect. Stable chronic
microvascular ischemic changes and parenchymal volume loss of the
brain.

Vascular: Calcific atherosclerosis of carotid siphons.

Skull: Normal. Negative for fracture or focal lesion.

Sinuses/Orbits: No acute finding.

Other: None.
IMPRESSION: 1. No acute intracranial abnormality identified.
2. Stable chronic microvascular ischemic changes and parenchymal
volume loss of the brain.

By: Lezanne Thaba M.D.

## 2018-12-01 ENCOUNTER — Telehealth: Payer: Self-pay | Admitting: Family Medicine

## 2018-12-01 NOTE — Telephone Encounter (Signed)
appt scheduled Pt notified 

## 2018-12-04 ENCOUNTER — Encounter: Payer: Self-pay | Admitting: Family Medicine

## 2018-12-04 ENCOUNTER — Other Ambulatory Visit: Payer: Self-pay

## 2018-12-04 ENCOUNTER — Ambulatory Visit (INDEPENDENT_AMBULATORY_CARE_PROVIDER_SITE_OTHER): Payer: PPO | Admitting: Family Medicine

## 2018-12-04 VITALS — BP 164/79 | HR 79 | Temp 97.6°F | Ht 66.0 in | Wt 186.6 lb

## 2018-12-04 DIAGNOSIS — M1712 Unilateral primary osteoarthritis, left knee: Secondary | ICD-10-CM | POA: Diagnosis not present

## 2018-12-04 MED ORDER — METHYLPREDNISOLONE ACETATE 80 MG/ML IJ SUSP
80.0000 mg | Freq: Once | INTRAMUSCULAR | Status: AC
  Administered 2018-12-04: 80 mg via INTRAMUSCULAR

## 2018-12-04 NOTE — Progress Notes (Signed)
BP (!) 164/79   Pulse 79   Temp 97.6 F (36.4 C) (Oral)   Ht 5' 6"  (1.676 m)   Wt 186 lb 9.6 oz (84.6 kg)   BMI 30.12 kg/m    Subjective:    Patient ID: [name], female    DOB: [birth year], 68 y.o.   MRN: [id number]  HPI: [name] is a 68 y.o. female presenting on 12/04/2018 for Knee Pain (left- x 2 weeks)   HPI Left knee pain Patient has been complaining of the left knee pain starting to creep up on her over the past couple weeks.  She has been diagnosed with osteoarthritis in the knee and has been causing her more issues more recently.  Patient denies any giving way but she does get some popping in the knee or grinding in the knee sometimes.  She says the last injection that she had last year worked for a very long time and just recently stopped working.  She says her knee does swell up some but then it goes down and she has been using some over-the-counter Tylenol arthritis for it which does help some.  Patient denies any fevers or chills or redness or warmth.  She feels like it is been worsening over the past couple weeks.  Relevant past medical, surgical, family and social history reviewed and updated as indicated. Interim medical history since our last visit reviewed. Allergies and medications reviewed and updated.  Review of Systems  Constitutional: Negative for chills and fever.  Eyes: Negative for visual disturbance.  Respiratory: Negative for chest tightness and shortness of breath.   Cardiovascular: Negative for chest pain and leg swelling.  Musculoskeletal: Positive for arthralgias. Negative for back pain, gait problem and joint swelling.  Skin: Negative for rash.  Neurological: Negative for light-headedness and headaches.  Psychiatric/Behavioral: Negative for agitation and behavioral problems.  All other systems reviewed and are negative.   Per HPI unless specifically indicated above   Allergies as of 12/04/2018      Reactions   Iohexol      Desc: pt had syncopal episode with nausea post IV CM late 1990's,  pt has had prednisone prep with heart caths x 2 without problem  kdean 04/16/07, Onset Date: [id number]   Ticlid [ticlopidine Hcl] Nausea And Vomiting   Codeine Nausea And Vomiting, Palpitations      Medication List       Accurate as of December 04, 2018 10:38 AM. Always use your most recent med list.        ALPRAZolam 0.5 MG tablet Commonly known as:  XANAX TAKE ONE TABLET 3 TIMES A DAY AS NEEDED.   amLODipine 5 MG tablet Commonly known as:  NORVASC TAKE ONE (1) TABLET EACH DAY   aspirin 325 MG tablet Take 325 mg by mouth daily.   atenolol 50 MG tablet Commonly known as:  TENORMIN Take 1 tablet (50 mg total) by mouth 2 (two) times daily.   Blood Glucose Monitor System w/Device Kit Patient checks glucose TID; dispense strips and lancets also, DX: E11.65, Z79.4   citalopram 20 MG tablet Commonly known as:  CELEXA TAKE ONE (1) TABLET EACH DAY   empagliflozin 10 MG Tabs tablet Commonly known as:  Jardiance Take 10 mg by mouth daily.   FreeStyle Libre 14 Day Sensor Misc USE TO CHECK BLOOD GLUCOSE CHANGE EVERY 14 DAYS   furosemide 40 MG tablet Commonly known as:  LASIX TAKE ONE (1) TABLET EACH DAY  glucose blood test strip Commonly known as:  IGlucose Test Strips Use to check blood sugars three times daily Dx E11.65 Z79.4   isosorbide mononitrate 120 MG 24 hr tablet Commonly known as:  IMDUR TAKE ONE (1) TABLET EACH DAY   lisinopril 40 MG tablet Commonly known as:  PRINIVIL,ZESTRIL Take 1 tablet (40 mg total) by mouth daily.   meclizine 25 MG tablet Commonly known as:  ANTIVERT Take 1 tablet (25 mg total) by mouth 3 (three) times daily as needed for dizziness.   metFORMIN 500 MG tablet Commonly known as:  GLUCOPHAGE Take 1 tablet (500 mg total) by mouth daily with breakfast.   nitroGLYCERIN 0.4 MG SL tablet Commonly known as:  Nitrostat DISSOLVE 1 TAB UNDER TOUNGE FOR CHEST PAIN. MAY REPEAT  EVERY 5 MINUTES FOR 3 DOSES. IF NO RELIEF CALL 911 OR GO TO ER   NovoLOG FlexPen 100 UNIT/ML FlexPen Generic drug:  insulin aspart INJECT 35 UNITS SQ 3 TIMES DAILY   potassium chloride SA 20 MEQ tablet Commonly known as:  K-DUR,KLOR-CON Take 2 tablets (40 mEq total) by mouth daily.   rosuvastatin 40 MG tablet Commonly known as:  CRESTOR TAKE ONE (1) TABLET EACH DAY   Toujeo SoloStar 300 UNIT/ML Sopn Generic drug:  Insulin Glargine (1 Unit Dial) INJECT 50-60 UNITS SQ DAILY   vitamin B-12 1000 MCG tablet Commonly known as:  CYANOCOBALAMIN Take 1,000 mcg by mouth daily.   Vitamin D (Ergocalciferol) 1.25 MG (50000 UT) Caps capsule Commonly known as:  DRISDOL TAKE 1 CAPSULE EVERY 7 DAYS   zolpidem 10 MG tablet Commonly known as:  AMBIEN Take 0.5 tablets (5 mg total) by mouth at bedtime as needed.          Objective:    BP (!) 164/79   Pulse 79   Temp 97.6 F (36.4 C) (Oral)   Ht 5' 6"  (1.676 m)   Wt 186 lb 9.6 oz (84.6 kg)   BMI 30.12 kg/m   Wt Readings from Last 3 Encounters:  12/04/18 186 lb 9.6 oz (84.6 kg)  09/08/18 184 lb (83.5 kg)  05/07/18 182 lb 12.8 oz (82.9 kg)    Physical Exam Vitals signs and nursing note reviewed.  Constitutional:      General: She is not in acute distress.    Appearance: She is well-developed. She is not diaphoretic.  Musculoskeletal: Normal range of motion.     Left knee: She exhibits effusion (Small amount of effusion). She exhibits normal range of motion, normal alignment, no LCL laxity, normal patellar mobility, normal meniscus and no MCL laxity. Tenderness found. Medial joint line and lateral joint line tenderness noted.  Skin:    General: Skin is warm and dry.     Findings: No rash.  Neurological:     Mental Status: She is alert and oriented to person, place, and time.     Coordination: Coordination normal.  Psychiatric:        Behavior: Behavior normal.     Knee injection: Consent form signed. Risk factors of bleeding  and infection discussed with patient and patient is agreeable towards injection. Patient prepped with Betadine. Lateral approach towards injection used. Injected 80 mg of Depo-Medrol and 1 mL of 2% lidocaine. Patient tolerated procedure well and no side effects from noted. Minimal to no bleeding. Simple bandage applied after.     Assessment & Plan:   Problem List Items Addressed This Visit    None    Visit Diagnoses  Arthritis of left knee    -  Primary   Relevant Medications   methylPREDNISolone acetate (DEPO-MEDROL) injection 80 mg (Completed)       Follow up plan: Return if symptoms worsen or fail to improve.  Counseling provided for all of the vaccine components No orders of the defined types were placed in this encounter.   Caryl Pina, MD [facility] 12/04/2018, 10:38 AM

## 2018-12-05 ENCOUNTER — Other Ambulatory Visit: Payer: Self-pay | Admitting: Family Medicine

## 2018-12-05 ENCOUNTER — Telehealth: Payer: Self-pay | Admitting: Family Medicine

## 2018-12-10 ENCOUNTER — Ambulatory Visit: Payer: PPO | Admitting: Family Medicine

## 2018-12-23 ENCOUNTER — Encounter: Payer: PPO | Admitting: *Deleted

## 2018-12-31 ENCOUNTER — Other Ambulatory Visit: Payer: Self-pay | Admitting: Family Medicine

## 2019-01-13 ENCOUNTER — Ambulatory Visit (INDEPENDENT_AMBULATORY_CARE_PROVIDER_SITE_OTHER): Payer: PPO | Admitting: Family Medicine

## 2019-01-13 ENCOUNTER — Encounter: Payer: Self-pay | Admitting: Family Medicine

## 2019-01-13 ENCOUNTER — Other Ambulatory Visit: Payer: Self-pay

## 2019-01-13 DIAGNOSIS — I70213 Atherosclerosis of native arteries of extremities with intermittent claudication, bilateral legs: Secondary | ICD-10-CM

## 2019-01-13 DIAGNOSIS — K219 Gastro-esophageal reflux disease without esophagitis: Secondary | ICD-10-CM | POA: Diagnosis not present

## 2019-01-13 DIAGNOSIS — E538 Deficiency of other specified B group vitamins: Secondary | ICD-10-CM

## 2019-01-13 DIAGNOSIS — I25119 Atherosclerotic heart disease of native coronary artery with unspecified angina pectoris: Secondary | ICD-10-CM

## 2019-01-13 DIAGNOSIS — I739 Peripheral vascular disease, unspecified: Secondary | ICD-10-CM | POA: Diagnosis not present

## 2019-01-13 DIAGNOSIS — I10 Essential (primary) hypertension: Secondary | ICD-10-CM | POA: Diagnosis not present

## 2019-01-13 DIAGNOSIS — M170 Bilateral primary osteoarthritis of knee: Secondary | ICD-10-CM | POA: Diagnosis not present

## 2019-01-13 DIAGNOSIS — E1165 Type 2 diabetes mellitus with hyperglycemia: Secondary | ICD-10-CM | POA: Diagnosis not present

## 2019-01-13 DIAGNOSIS — R7989 Other specified abnormal findings of blood chemistry: Secondary | ICD-10-CM

## 2019-01-13 DIAGNOSIS — E1129 Type 2 diabetes mellitus with other diabetic kidney complication: Secondary | ICD-10-CM | POA: Diagnosis not present

## 2019-01-13 DIAGNOSIS — M1712 Unilateral primary osteoarthritis, left knee: Secondary | ICD-10-CM

## 2019-01-13 DIAGNOSIS — Z9114 Patient's other noncompliance with medication regimen: Secondary | ICD-10-CM

## 2019-01-13 DIAGNOSIS — Z794 Long term (current) use of insulin: Secondary | ICD-10-CM

## 2019-01-13 MED ORDER — CITALOPRAM HYDROBROMIDE 20 MG PO TABS: ORAL_TABLET | ORAL | 3 refills | Status: DC

## 2019-01-13 MED ORDER — VITAMIN D (ERGOCALCIFEROL) 1.25 MG (50000 UNIT) PO CAPS: ORAL_CAPSULE | ORAL | 3 refills | Status: DC

## 2019-01-13 MED ORDER — EMPAGLIFLOZIN 10 MG PO TABS: 10.0000 mg | ORAL_TABLET | Freq: Every day | ORAL | 0 refills | Status: DC

## 2019-01-13 MED ORDER — INSULIN ASPART 100 UNIT/ML FLEXPEN: 35.0000 [IU] | PEN_INJECTOR | Freq: Three times a day (TID) | SUBCUTANEOUS | 3 refills | Status: DC

## 2019-01-13 MED ORDER — ATENOLOL 50 MG PO TABS: 50.0000 mg | ORAL_TABLET | Freq: Two times a day (BID) | ORAL | 3 refills | Status: DC

## 2019-01-13 MED ORDER — ROSUVASTATIN CALCIUM 40 MG PO TABS: ORAL_TABLET | ORAL | 3 refills | Status: DC

## 2019-01-13 MED ORDER — LISINOPRIL 40 MG PO TABS: 40.0000 mg | ORAL_TABLET | Freq: Every day | ORAL | 3 refills | Status: DC

## 2019-01-13 MED ORDER — ISOSORBIDE MONONITRATE ER 120 MG PO TB24: ORAL_TABLET | ORAL | 3 refills | Status: DC

## 2019-01-13 MED ORDER — FUROSEMIDE 40 MG PO TABS: ORAL_TABLET | ORAL | 3 refills | Status: DC

## 2019-01-13 MED ORDER — POTASSIUM CHLORIDE CRYS ER 20 MEQ PO TBCR: 40.0000 meq | EXTENDED_RELEASE_TABLET | Freq: Every day | ORAL | 3 refills | Status: DC

## 2019-01-13 NOTE — Patient Instructions (Addendum)
The patient should continue with her current medications She should make every effort with diet and exercise to keep her weight down and her blood sugar under the best control possible We will arrange for her to have a routine follow-up visit with her cardiologist which he will determine how often she needs to be seen in the future.  She has multiple risk factors for heart disease.  She has heart disease. She should continue to drink plenty of fluids and stay well-hydrated She should continue to practice good hand and respiratory hygiene during this pandemic and in the foreseeable future. Patient needs to reschedule eye exam We will schedule her for a visit to visit the cardiologist in [facility] when he comes for a routine visit because of her heart issues and multiple risk factors. We will okay all of the medicines for maximum refills. She is encouraged to take her diabetic medicines on a regular basis and not to just stop these when she thinks they are not helping.

## 2019-01-13 NOTE — Addendum Note (Signed)
Addended by: Zannie Cove on: 01/13/2019 01:49 PM   Modules accepted: Orders

## 2019-01-13 NOTE — Progress Notes (Signed)
Virtual Visit Via telephone Note I connected with@ on 01/13/19 by telephone and verified that I am speaking with the correct person or authorized healthcare agent using two identifiers. [name] is currently located at home and there are no unauthorized people in close proximity. I completed this visit while in a private location in my home .  I connected to the patient by phone and verify that I was speaking with the correct person.  This visit type was conducted due to national recommendations for restrictions regarding the COVID-19 Pandemic (e.g. social distancing).  This format is felt to be most appropriate for this patient at this time.  All issues noted in this document were discussed and addressed.  No physical exam was performed.    I discussed the limitations, risks, security and privacy concerns of performing an evaluation and management service by telephone and the availability of in person appointments. I also discussed with the patient that there may be a patient responsible charge related to this service. The patient expressed understanding and agreed to proceed.   Date:  01/13/2019    ID:  [name]      Feb 12, 1951        [id number]   Patient Care Team Patient Care Team: Chipper Herb, MD as PCP - General (Family Medicine) Gala Romney Cristopher Estimable, MD (Gastroenterology) Minus Breeding, MD (Cardiology)  Reason for Visit: Primary Care Follow-up     History of Present Illness & Review of Systems:     [name] is a 68 y.o. year old female primary care patient that presents today for a telehealth visit.  I have known this patient for many years.  She has insulin-dependent diabetes coronary artery disease osteoarthritis peripheral vascular insufficiency vitamin B12 deficiency low vitamin D and coronary artery disease.  She has problems with arthritis and gastroesophageal reflux.  She also has claudication complaints.  She is always had problems  with good blood sugar control.  She has a very laidback personality and does not complain a lot.  She current lives by herself.  Patient today says her biggest problem is the arthritis in her knees.  She saw Dr. Lorin Mercy and he told her that the shots did not seem to be helping and she may need knee replacement but she refuses to do that currently.  She does not walk a lot because her knees hurt.  She denies any chest pain pressure tightness or shortness of breath.  She does have occasional indigestion but otherwise does okay with her stomach without trouble swallowing nausea vomiting diarrhea blood in the stool black tarry bowel movements or change in bowel habits.  She is passing her water well.  She is not up-to-date on her eye exams and needs 1.  She also has not seen the cardiologist and a good while and needs a return appointment to see him.  This is Dr. Percival Spanish.  She will need all of her medicines refilled within the next 2 to 4 weeks.  We will make sure this happens.  She seems to not be taking her medicines as prescribed and this is not unusual.  She has stopped her Jardiance as of last week.  She is no longer taking Toujeo but is back on NovoLog.  She is in the donut hole.  She needs constant encouragement to take her medicines regularly whether she thinks or not they are working.  This seems to be an ongoing problem  with her.  She is taking her potassium regularly.    Review of systems as stated otherwise negative for body systems left unmentioned.   The patient does not have symptoms concerning for COVID-19 infection (fever, chills, cough, or new shortness of breath).      Current Medications (Verified) Allergies as of 01/13/2019      Reactions   Iohexol     Desc: pt had syncopal episode with nausea post IV CM late 1990's,  pt has had prednisone prep with heart caths x 2 without problem  kdean 04/16/07, Onset Date: [id number]   Ticlid [ticlopidine Hcl] Nausea And Vomiting   Codeine Nausea And  Vomiting, Palpitations      Medication List       Accurate as of January 13, 2019  8:13 AM. Always use your most recent med list.        ALPRAZolam 0.5 MG tablet Commonly known as:  XANAX TAKE ONE TABLET 3 TIMES A DAY AS NEEDED.   amLODipine 5 MG tablet Commonly known as:  NORVASC TAKE ONE (1) TABLET EACH DAY   aspirin 325 MG tablet Take 325 mg by mouth daily.   atenolol 50 MG tablet Commonly known as:  TENORMIN Take 1 tablet (50 mg total) by mouth 2 (two) times daily.   Blood Glucose Monitor System w/Device Kit Patient checks glucose TID; dispense strips and lancets also, DX: E11.65, Z79.4   citalopram 20 MG tablet Commonly known as:  CELEXA TAKE ONE (1) TABLET EACH DAY   empagliflozin 10 MG Tabs tablet Commonly known as:  Jardiance Take 10 mg by mouth daily.   FreeStyle Libre 14 Day Sensor Misc USE TO CHECK BLOOD GLUCOSE   furosemide 40 MG tablet Commonly known as:  LASIX TAKE ONE (1) TABLET EACH DAY   glucose blood test strip Commonly known as:  IGlucose Test Strips Use to check blood sugars three times daily Dx E11.65 Z79.4   isosorbide mononitrate 120 MG 24 hr tablet Commonly known as:  IMDUR TAKE ONE (1) TABLET EACH DAY   lisinopril 40 MG tablet Commonly known as:  ZESTRIL Take 1 tablet (40 mg total) by mouth daily.   meclizine 25 MG tablet Commonly known as:  ANTIVERT Take 1 tablet (25 mg total) by mouth 3 (three) times daily as needed for dizziness.   metFORMIN 500 MG tablet Commonly known as:  GLUCOPHAGE Take 1 tablet (500 mg total) by mouth daily with breakfast.   nitroGLYCERIN 0.4 MG SL tablet Commonly known as:  Nitrostat DISSOLVE 1 TAB UNDER TOUNGE FOR CHEST PAIN. MAY REPEAT EVERY 5 MINUTES FOR 3 DOSES. IF NO RELIEF CALL 911 OR GO TO ER   NovoLOG FlexPen 100 UNIT/ML FlexPen Generic drug:  insulin aspart INJECT 35 UNITS 3 TIMES DAILY   potassium chloride SA 20 MEQ tablet Commonly known as:  K-DUR Take 2 tablets (40 mEq total) by mouth  daily.   rosuvastatin 40 MG tablet Commonly known as:  CRESTOR TAKE ONE (1) TABLET EACH DAY   Toujeo SoloStar 300 UNIT/ML Sopn Generic drug:  Insulin Glargine (1 Unit Dial) INJECT 50-60 UNITS SQ DAILY   vitamin B-12 1000 MCG tablet Commonly known as:  CYANOCOBALAMIN Take 1,000 mcg by mouth daily.   Vitamin D (Ergocalciferol) 1.25 MG (50000 UT) Caps capsule Commonly known as:  DRISDOL TAKE 1 CAPSULE EVERY 7 DAYS   zolpidem 10 MG tablet Commonly known as:  AMBIEN Take 0.5 tablets (5 mg total) by mouth at bedtime as needed.  Allergies (Verified)    Iohexol; Ticlid [ticlopidine hcl]; and Codeine  Past Medical History Past Medical History:  Diagnosis Date  . Anxiety   . CAD (coronary artery disease)    DES to circumflex 02/2007, BMS to LAD and PTCA diagonal 03/2007  . Carotid artery plaque    Mild  . Cataract   . Depression   . Diverticulitis, colon   . Elevated d-dimer 01/08/2014  . Essential hypertension, benign   . GERD (gastroesophageal reflux disease)   . H/O hiatal hernia   . HLD (hyperlipidemia)   . IDDM (insulin dependent diabetes mellitus) ([place])   . Migraine    "used to have them really bad; don't have them anymore" (01/07/2014)  . MS (multiple sclerosis) ([place])    Not confirmed  . PAT (paroxysmal atrial tachycardia) ([place])   . Prolapse of uterus   . PVD (peripheral vascular disease) ([place])   . TIA (transient ischemic attack) 1980's     Past Surgical History:  Procedure Laterality Date  . ABDOMINAL HYSTERECTOMY  1986   ovaries remain - prolaspe uterus   . APPENDECTOMY  ~ 1970  . BREAST BIOPSY Right 1980's  . BREAST LUMPECTOMY Right 1980's   Dr. Charlynne Pander   . CARDIAC CATHETERIZATION  01/07/2014  . CHOLECYSTECTOMY  ?1987  . COLONOSCOPY  2002   Dr. Anwar--> Severe diverticular changes in the region of the sigmoid and descending colon with scattered diverticular changes throughout the rest of the colon. No polyps, ulcerations. Despite numerous  manipulations, the tip of the scope could not be tipped into the cecal area.  . COLONOSCOPY  01/10/2012   Procedure: COLONOSCOPY;  Surgeon: Daneil Dolin, MD;  [facility];  Service: Endoscopy;  Laterality: N/A;  1:55  . CORONARY ANGIOPLASTY WITH STENT PLACEMENT  ~ 1997 X 2   "2 + 1"  . EYE SURGERY Bilateral 2014   cataract  . INTRAVASCULAR PRESSURE WIRE/FFR STUDY N/A 03/08/2017   Procedure: Intravascular Pressure Wire/FFR Study;  Surgeon: Nelva Bush, MD;  Location: [facility];  Service: Cardiovascular;  Laterality: N/A;  . LEFT HEART CATH AND CORONARY ANGIOGRAPHY N/A 03/08/2017   Procedure: Left Heart Cath and Coronary Angiography;  Surgeon: Nelva Bush, MD;  Location: [place] [facility];  Service: Cardiovascular;  Laterality: N/A;  . LEFT HEART CATHETERIZATION WITH CORONARY ANGIOGRAM N/A 01/07/2014   Procedure: LEFT HEART CATHETERIZATION WITH CORONARY ANGIOGRAM;  Surgeon: Larey Dresser, MD;  Location: [facility] CATH LAB;  Service: Cardiovascular;  Laterality: N/A;    Social History   Socioeconomic History  . Marital status: Widowed    Spouse name: Not on file  . Number of children: 4  . Years of education: Not on file  . Highest education level: Not on file  Occupational History  . Occupation: Disability    Employer: DISABLED  Social Needs  . Financial resource strain: Not on file  . Food insecurity:    Worry: Not on file    Inability: Not on file  . Transportation needs:    Medical: Not on file    Non-medical: Not on file  Tobacco Use  . Smoking status: Never Smoker  . Smokeless tobacco: Never Used  . Tobacco comment: spouse, 24 years - husband has quit 01/2011  Substance and Sexual Activity  . Alcohol use: No  . Drug use: No  . Sexual activity: Not Currently  Lifestyle  . Physical activity:    Days per week: Not on file  Minutes per session: Not on file  . Stress: Not on file  Relationships  . Social connections:    Talks on phone: Not on  file    Gets together: Not on file    Attends religious service: Not on file    Active member of club or organization: Not on file    Attends meetings of clubs or organizations: Not on file    Relationship status: Not on file  Other Topics Concern  . Not on file  Social History Narrative  . Not on file     Family History  Problem Relation Age of Onset  . Heart attack Mother 22  . Diabetes Mother   . Hypertension Mother   . Heart attack Father 9  . Heart attack Brother 32       x 6  . Colon cancer Paternal Aunt        15s, died with brain anuerysm  . Crohn's disease Cousin        paternal      Labs/Other Tests and Data Reviewed:    Wt Readings from Last 3 Encounters:  12/04/18 186 lb 9.6 oz (84.6 kg)  09/08/18 184 lb (83.5 kg)  05/07/18 182 lb 12.8 oz (82.9 kg)   Temp Readings from Last 3 Encounters:  12/04/18 97.6 F (36.4 C) (Oral)  09/08/18 97.6 F (36.4 C) (Oral)  05/07/18 98.7 F (37.1 C) (Oral)   BP Readings from Last 3 Encounters:  12/04/18 (!) 164/79  09/08/18 137/74  05/07/18 133/74   Pulse Readings from Last 3 Encounters:  12/04/18 79  09/08/18 93  05/07/18 73     Lab Results  Component Value Date   HGBA1C 8.8 (H) 09/08/2018   HGBA1C 8.5 (H) 04/23/2018   HGBA1C 9.3 (H) 09/22/2017   Lab Results  Component Value Date   MICROALBUR negative 04/26/2014   LDLCALC 57 09/08/2018   CREATININE 1.05 (H) 09/22/2018       Chemistry      Component Value Date/Time   NA 140 09/22/2018 0825   K 4.6 09/22/2018 0825   CL 100 09/22/2018 0825   CO2 23 09/22/2018 0825   BUN 10 09/22/2018 0825   CREATININE 1.05 (H) 09/22/2018 0825   CREATININE 1.19 (H) 01/05/2014 1714      Component Value Date/Time   CALCIUM 9.1 09/22/2018 0825   ALKPHOS 60 09/08/2018 1224   AST 20 09/08/2018 1224   ALT 14 09/08/2018 1224   BILITOT 0.4 09/08/2018 1224         OBSERVATIONS/ OBJECTIVE:     The patient was alert and communicative.  She is aware of the  medicines that she is taking and not taking and periodically just stopped taking medicines because she says she does not think they are helping and this includes some of her diabetic medicines.  She is also in the donut hole which creates issues with affording the medication.  She is not walking a lot because of her knee pain.  She is refusing to have any knee surgery.  Her fasting blood sugars are running around 219 and sometimes higher.  This is with not taking the Jardiance.  Her blood pressures running in the 130/85 range on a regular basis.  She is due to get an eye exam.  She needs refills on all of her medicines in a couple weeks.  She is also not had a B12 shot in several months.  She did not feel this was helping her.  Physical exam deferred due to nature of telephonic visit.  ASSESSMENT & PLAN    Time:   Today, I have spent 24 minutes with the patient via telephone discussing the above including Covid precautions.     Visit Diagnoses: 1. Type 2 diabetes mellitus with hyperglycemia, with long-term current use of insulin (HCC) -Continue to monitor blood sugars regularly and take medicine regularly and not stop it.  Blood pressures have been running higher at home.  She is off of the [place] now.  She is changed back to NovoLog from [place].  2. Essential hypertension, benign -She should continue with her current antihypertensive medicines including lisinopril.  3. Vitamin B12 deficiency -She has not been taking her B12 shots or taking B12 on that mouth other than a multivitamin.  4. Primary osteoarthritis of left knee -She is followed by Dr. Lorin Mercy for the arthritis in her knees and he has periodically given her shots.  He says he cannot give her many more shots due to the fact that they have lost their effectiveness.  And she refuses to have any kind of surgery.  5. Gastroesophageal reflux disease, esophagitis presence not specified -No complaints with this today.  6. Low serum  vitamin D - she continues to take her vitamin D once a week.  7. Peripheral vascular insufficiency ([place]) -She has trouble walking because of knee pain.  8. Atherosclerosis of native artery of both lower extremities with intermittent claudication ([place]) -She has pain with walking and does not walk a lot.  9. Atherosclerosis of native coronary artery of native heart with angina pectoris ([place]) -Reschedule visit with Dr. Archie Patten for routine follow-up  10. Type 2 diabetes mellitus with other diabetic kidney complication, with long-term current use of insulin (HCC) -Continue to take medication regularly and do not stop this of her own accord.  Try to get more insulin on refills and she is currently getting as she is not having enough to cover her diabetes when her sugars go higher.  11. Claudication ([place]) -Continue to make every effort to walk on a regular basis as this is good for the arthritis and good for the blood sugar.  Patient Instructions  The patient should continue with her current medications She should make every effort with diet and exercise to keep her weight down and her blood sugar under the best control possible We will arrange for her to have a routine follow-up visit with her cardiologist which he will determine how often she needs to be seen in the future.  She has multiple risk factors for heart disease.  She has heart disease. She should continue to drink plenty of fluids and stay well-hydrated She should continue to practice good hand and respiratory hygiene during this pandemic and in the foreseeable future. Patient needs to reschedule eye exam We will schedule her for a visit to visit the cardiologist in [facility] when he comes for a routine visit because of her heart issues and multiple risk factors. We will okay all of the medicines for maximum refills. She is encouraged to take her diabetic medicines on a regular basis and not to just stop these when she thinks they are  not helping.       The above assessment and management plan was discussed with the patient. The patient verbalized understanding of and has agreed to the management plan. Patient is aware to call the clinic if symptoms persist or worsen. Patient is aware when to return to the clinic for a  follow-up visit. Patient educated on when it is appropriate to go to the emergency department.    Chipper Herb, MD [address], [place], [place] [address] Ph [phone]   Arrie Senate MD

## 2019-01-19 ENCOUNTER — Other Ambulatory Visit: Payer: Self-pay

## 2019-01-19 ENCOUNTER — Ambulatory Visit (INDEPENDENT_AMBULATORY_CARE_PROVIDER_SITE_OTHER): Payer: PPO | Admitting: *Deleted

## 2019-01-19 DIAGNOSIS — Z79899 Other long term (current) drug therapy: Secondary | ICD-10-CM | POA: Diagnosis not present

## 2019-01-19 DIAGNOSIS — I739 Peripheral vascular disease, unspecified: Secondary | ICD-10-CM

## 2019-01-19 DIAGNOSIS — E1165 Type 2 diabetes mellitus with hyperglycemia: Secondary | ICD-10-CM

## 2019-01-19 DIAGNOSIS — Z0000 Encounter for general adult medical examination without abnormal findings: Secondary | ICD-10-CM

## 2019-01-19 DIAGNOSIS — Z0001 Encounter for general adult medical examination with abnormal findings: Secondary | ICD-10-CM

## 2019-01-19 DIAGNOSIS — I10 Essential (primary) hypertension: Secondary | ICD-10-CM | POA: Diagnosis not present

## 2019-01-19 DIAGNOSIS — Z794 Long term (current) use of insulin: Secondary | ICD-10-CM

## 2019-01-19 NOTE — Progress Notes (Addendum)
MEDICARE ANNUAL WELLNESS VISIT  01/19/2019  Telephone Visit Disclaimer This Medicare AWV was conducted by telephone due to national recommendations for restrictions regarding the COVID-19 Pandemic (e.g. social distancing).  I verified, using two identifiers, that I am speaking with [name] or their authorized healthcare agent. I discussed the limitations, risks, security, and privacy concerns of performing an evaluation and management service by telephone and the potential availability of an in-person appointment in the future. The patient expressed understanding and agreed to proceed.   Subjective:   [name] is a 68 y.o. female patient of Karen Herb, MD who had a Medicare Annual Wellness Visit today via telephone. [name] is Retired and lives alone. she reports that she is socially active and does interact with friends and/or family regularly. she is not physically active and enjoys going to church and participating in activities. She also enjoys spending time with her family. Patient does state she is already in the donut hole and is having a hard time affording her insulin. I discussed with patient CCM and referral was placed.   Patient Care Team: Karen Herb, MD as PCP - General (Family Medicine) [name], Karen Estimable, MD (Gastroenterology) Karen Breeding, MD (Cardiology)  [name] Utilization Over the Past 12 Months: # of hospitalizations or ER visits: 0 # of surgeries: 0  Review of Systems    Patient reports that her overall health is unchanged compared to last year.  Patient Reported Readings (BP, Pulse, CBG, Weight, etc) Patients BS this morning was 217  Review of Systems Positive For: Musculoskeletal ROS: positive for - pain in knee - bilateral  All other systems negative.      Current Medications (verified) Allergies as of 01/19/2019      Reactions   Iohexol     Desc: pt had syncopal episode with nausea post IV CM late 1990's,  pt has  had prednisone prep with heart caths x 2 without problem  kdean 04/16/07, Onset Date: [id number]   Ticlid [ticlopidine Hcl] Nausea And Vomiting   Codeine Nausea And Vomiting, Palpitations      Medication List       Accurate as of January 19, 2019 12:08 PM. Always use your most recent med list.        ALPRAZolam 0.5 MG tablet Commonly known as:  XANAX TAKE ONE TABLET 3 TIMES A DAY AS NEEDED.   amLODipine 5 MG tablet Commonly known as:  NORVASC TAKE ONE (1) TABLET EACH DAY   aspirin 325 MG tablet Take 325 mg by mouth daily.   atenolol 50 MG tablet Commonly known as:  TENORMIN Take 1 tablet (50 mg total) by mouth 2 (two) times daily.   citalopram 20 MG tablet Commonly known as:  CELEXA TAKE ONE (1) TABLET EACH DAY   empagliflozin 10 MG Tabs tablet Commonly known as:  Jardiance Take 10 mg by mouth daily.   FreeStyle Libre 14 Day Sensor Misc USE TO CHECK BLOOD GLUCOSE   furosemide 40 MG tablet Commonly known as:  LASIX TAKE ONE (1) TABLET EACH DAY   insulin aspart 100 UNIT/ML FlexPen Commonly known as:  NovoLOG FlexPen Inject 35-40 Units into the skin 3 (three) times daily with meals.   isosorbide mononitrate 120 MG 24 hr tablet Commonly known as:  IMDUR TAKE ONE (1) TABLET EACH DAY   lisinopril 40 MG tablet Commonly known as:  ZESTRIL Take 1 tablet (40 mg total) by mouth daily.   meclizine 25 MG  tablet Commonly known as:  ANTIVERT Take 1 tablet (25 mg total) by mouth 3 (three) times daily as needed for dizziness.   nitroGLYCERIN 0.4 MG SL tablet Commonly known as:  Nitrostat DISSOLVE 1 TAB UNDER TOUNGE FOR CHEST PAIN. MAY REPEAT EVERY 5 MINUTES FOR 3 DOSES. IF NO RELIEF CALL 911 OR GO TO ER   potassium chloride SA 20 MEQ tablet Commonly known as:  K-DUR Take 2 tablets (40 mEq total) by mouth daily.   rosuvastatin 40 MG tablet Commonly known as:  CRESTOR TAKE ONE (1) TABLET EACH DAY   Toujeo SoloStar 300 UNIT/ML Sopn Generic drug:  Insulin Glargine (1 Unit  Dial) INJECT 50-60 UNITS SQ DAILY   vitamin B-12 1000 MCG tablet Commonly known as:  CYANOCOBALAMIN Take 1,000 mcg by mouth daily.   Vitamin D (Ergocalciferol) 1.25 MG (50000 UT) Caps capsule Commonly known as:  DRISDOL TAKE 1 CAPSULE EVERY 7 DAYS   zolpidem 10 MG tablet Commonly known as:  AMBIEN Take 0.5 tablets (5 mg total) by mouth at bedtime as needed.       Allergies (verified) Iohexol; Ticlid [ticlopidine hcl]; and Codeine   History (reviewed): Past Medical History:  Diagnosis Date  . Anxiety   . CAD (coronary artery disease)    DES to circumflex 02/2007, BMS to LAD and PTCA diagonal 03/2007  . Carotid artery plaque    Mild  . Cataract   . Depression   . Diverticulitis, colon   . Elevated d-dimer 01/08/2014  . Essential hypertension, benign   . GERD (gastroesophageal reflux disease)   . H/O hiatal hernia   . HLD (hyperlipidemia)   . IDDM (insulin dependent diabetes mellitus) ([place])   . Migraine    "used to have them really bad; don't have them anymore" (01/07/2014)  . MS (multiple sclerosis) ([place])    Not confirmed  . PAT (paroxysmal atrial tachycardia) ([place])   . Prolapse of uterus   . PVD (peripheral vascular disease) ([place])   . TIA (transient ischemic attack) 1980's   Past Surgical History:  Procedure Laterality Date  . ABDOMINAL HYSTERECTOMY  1986   ovaries remain - prolaspe uterus   . APPENDECTOMY  ~ 1970  . BREAST BIOPSY Right 1980's  . BREAST LUMPECTOMY Right 1980's   Dr. Charlynne [name]   . CARDIAC CATHETERIZATION  01/07/2014  . CHOLECYSTECTOMY  ?1987  . COLONOSCOPY  2002   Dr. Anwar--> Severe diverticular changes in the region of the sigmoid and descending colon with scattered diverticular changes throughout the rest of the colon. No polyps, ulcerations. Despite numerous manipulations, the tip of the scope could not be tipped into the cecal area.  . COLONOSCOPY  01/10/2012   Procedure: COLONOSCOPY;  Surgeon: Karen Dolin, MD;  [facility];   Service: Endoscopy;  Laterality: N/A;  1:55  . CORONARY ANGIOPLASTY WITH STENT PLACEMENT  ~ 1997 X 2   "2 + 1"  . EYE SURGERY Bilateral 2014   cataract  . INTRAVASCULAR PRESSURE WIRE/FFR STUDY N/A 03/08/2017   Procedure: Intravascular Pressure Wire/FFR Study;  Surgeon: Nelva Bush, MD;  Location: [facility];  Service: Cardiovascular;  Laterality: N/A;  . LEFT HEART CATH AND CORONARY ANGIOGRAPHY N/A 03/08/2017   Procedure: Left Heart Cath and Coronary Angiography;  Surgeon: Nelva Bush, MD;  Location: [facility];  Service: Cardiovascular;  Laterality: N/A;  . LEFT HEART CATHETERIZATION WITH CORONARY ANGIOGRAM N/A 01/07/2014   Procedure: LEFT HEART CATHETERIZATION WITH CORONARY ANGIOGRAM;  Surgeon: Kirk Ruths  Claris Gladden, MD;  Location: [facility] CATH LAB;  Service: Cardiovascular;  Laterality: N/A;   Family History  Problem Relation Age of Onset  . Heart attack Mother 42  . Diabetes Mother   . Hypertension Mother   . Heart attack Father 38  . Heart attack Brother 32       x 6  . Heart disease Brother   . Diabetes Brother   . Colon cancer Paternal Aunt        16s, died with brain anuerysm  . Crohn's disease Cousin        paternal  . Diabetes Sister    Social History   Socioeconomic History  . Marital status: Widowed    Spouse name: Not on file  . Number of children: 4  . Years of education: 76  . Highest education level: 11th grade  Occupational History  . Occupation: Disability    Employer: DISABLED  Social Needs  . Financial resource strain: Very hard  . Food insecurity:    Worry: Never true    Inability: Never true  . Transportation needs:    Medical: No    Non-medical: No  Tobacco Use  . Smoking status: Never Smoker  . Smokeless tobacco: Never Used  . Tobacco comment: spouse, 64 years - husband has quit 01/2011  Substance and Sexual Activity  . Alcohol use: No  . Drug use: No  . Sexual activity: Not Currently  Lifestyle  . Physical activity:    Days  per week: 0 days    Minutes per session: 0 min  . Stress: Not at all  Relationships  . Social connections:    Talks on phone: More than three times a week    Gets together: More than three times a week    Attends religious service: More than 4 times per year    Active member of club or organization: No    Attends meetings of clubs or organizations: Never    Relationship status: Widowed  Other Topics Concern  . Not on file  Social History Narrative  . Not on file     Clinical Intake    Pain : 0-10 Pain Score: 10-Worst pain ever Pain Type: Chronic pain Pain Location: Knee Pain Orientation: Right, Left Pain Descriptors / Indicators: Constant Pain Onset: Other (comment)(Over a year-need knee replacement and does not want to have done ) Pain Frequency: Constant Pain Relieving Factors: Nothing Effect of Pain on Daily Activities: limits what she can do. She states it hurts for her to get up and walk  Pain Relieving Factors: Nothing  Diabetes: Yes CBG done?: Yes  How often do you need to have someone help you when you read instructions, pamphlets, or other written materials from your doctor or pharmacy?: 1 - Never  Interpreter Needed?: No      Activities of Daily Living In your present state of health, do you have any difficulty performing the following activities: 01/19/2019  Hearing? N  Vision? Y  Difficulty concentrating or making decisions? N  Walking or climbing stairs? Y  Comment due to bilateral knee pain   Dressing or bathing? N  Doing errands, shopping? N  Preparing Food and eating ? N  Using the Toilet? N  In the past six months, have you accidently leaked urine? N  Do you have problems with loss of bowel control? N  Managing your Medications? N  Managing your Finances? N  Housekeeping or managing your Housekeeping? N  Some recent data  might be hidden     Exercise Current Exercise Habits: The patient does not participate in regular exercise at  present, Exercise limited by: orthopedic condition(s)  Diet Patient reports consuming 2 meals a day and 1 snacks a day Patient reports that primary diet is: Diabetic  Depression Screen PHQ 2/9 Scores 01/19/2019 12/04/2018 09/08/2018 05/07/2018 04/23/2018 12/18/2017 12/11/2017  PHQ - 2 Score 0 0 0 0 0 2 0  PHQ- 9 Score - - - - - 8 -     Fall Risk Fall Risk  01/19/2019 09/08/2018 05/07/2018 04/23/2018 12/18/2017  Falls in the past year? 1 1 No No Yes  Number falls in past yr: 1 0 - - 2 or more  Injury with Fall? 1 0 - - Yes  Comment - - - - leg and knee pain, left elbow  Risk Factor Category  - - - - High Fall Risk  Risk for fall due to : History of fall(s);Impaired mobility - - - History of fall(s);Impaired balance/gait  Risk for fall due to: Comment - - - - Vertigo  Follow up Falls evaluation completed;Falls prevention discussed - - - Education provided;Falls prevention discussed     Objective:      Last 3 BP Readings Last Weight Last BMI  BP Readings from Last 3 Encounters:  12/04/18 (!) 164/79  09/08/18 137/74  05/07/18 133/74   Wt Readings from Last 3 Encounters:  12/04/18 186 lb 9.6 oz (84.6 kg)  09/08/18 184 lb (83.5 kg)  05/07/18 182 lb 12.8 oz (82.9 kg)   BMI Readings from Last 1 Encounters:  12/04/18 30.12 kg/m    *Unable to obtain current vital signs, weight, and BMI due to telephone visit type  [name] seemed alert and oriented and she participated appropriately during our telephone visit.  Advanced Directives 01/19/2019 12/18/2017 09/21/2017 03/08/2017 06/21/2016 03/02/2016 02/29/2016  Does Patient Have a Medical Advance Directive? Yes No No No No No No  Type of Paramedic of [place];Living will - - - - - -  Copy of [place] in Chart? No - copy requested - - - - - -  Would patient like information on creating a medical advance directive? - Yes (ED - Information included in AVS) - - No - patient declined information  No - patient declined information No - patient declined information  Pre-existing out of facility DNR order (yellow form or pink MOST form) - - - - - - -    Hearing/Vision  . Ismahan did not seem to have difficulty with hearing/understanding during the telephone conversation . Reports that she has not had a formal eye exam by an eye care professional within the past year . Reports that she has not had a forma hearing evaluation within the past year *Unable to fully assess hearing and vision during telephone visit type  Cognitive Function: 6 CIT Screening  6CIT Screen 01/19/2019  What Year? 0 points  What month? 0 points  What time? 0 points  Count back from 20 0 points  Months in reverse 0 points  Repeat phrase 2 points  Total Score 2    Normal Cognitive Function Screening: Yes (Normal:0-7, Significant for Dysfunction: >8)  Immunization Record Immunization History  Administered Date(s) Administered  . Influenza, High Dose Seasonal PF 06/19/2017, 06/18/2018  . Influenza,inj,Quad PF,6+ Mos 06/28/2015  . Influenza-Unspecified 05/25/2014, 06/15/2016  . Pneumococcal Conjugate-13 02/08/2014  . Pneumococcal Polysaccharide-23 06/28/2015  . Tdap 11/22/2010    Health  Maintenance  Health Maintenance  Topic Date Due  . OPHTHALMOLOGY EXAM  11/28/2016  . MAMMOGRAM  07/24/2017  . HEMOGLOBIN A1C  03/10/2019  . INFLUENZA VACCINE  04/25/2019  . COLON CANCER SCREENING ANNUAL FOBT  04/25/2019  . FOOT EXAM  09/09/2019  . DEXA SCAN  12/14/2019  . TETANUS/TDAP  11/21/2020  . COLONOSCOPY  01/09/2022  . Hepatitis C Screening  Completed  . PNA vac Low Risk Adult  Completed    Due Now or Overdue Health Maintenance Due  Topic Date Due  . OPHTHALMOLOGY EXAM  11/28/2016  . MAMMOGRAM  07/24/2017       Assessment:   This is a routine wellness examination for [name].    Plan:   Follow up with Dr. Laurance Flatten as planned Schedule eye exam  Patient will call schedule Mammogram.     Personalized Goals Goals Addressed            This Visit's Progress   . AWV goal       01/19/2019 AWV Goal: Medication Compliance  . Over the next year, patient will take of prescription medications as directed and will report any side effects to the prescribing provider.  . Over the next year, patient will advise prescribing provider of any dificulty with obtaining prescription medications. . Over the next year, patient will work with prescribing provider and community resources to obtain prescription assistance if needed and if qualified.   01/19/2019 AWV Goal: Keep All Scheduled Appointments  Over the next year, patient will attend all scheduled appointments with their PCP and any specialists that they see.        Personalized Health Maintenance & Screening Recommendations  Screening mammography Diabetic Eye Exam  Lung Cancer Screening Recommended: no (Low Dose CT Chest recommended if Age 25-80 years, 30 pack-year currently smoking OR have quit w/in past 15 years) Hepatitis C Screening recommended: no completed  HIV Screening recommended: no    Referrals & Orders Placed: Orders Placed This Encounter  Procedures  . Ambulatory referral to Chronic Care Management Services  Discussed CCM with patient and patient agreed    I have personally reviewed and noted the following in the patient's chart:   . Medical and social history . Use of alcohol, tobacco or illicit drugs  . Current medications and supplements . Functional ability and status . Nutritional status . Physical activity . Advanced directives . List of other physicians . Hospitalizations, surgeries, and ER visits in previous 12 months . Vitals . Screenings to include cognitive, depression, and falls . Referrals and appointments  In addition, I have reviewed and discussed with [name] certain preventive protocols, quality metrics, and best practice recommendations. A written personalized  care plan for preventive services as well as general preventive health recommendations is available and can be mailed to the patient at her request.       Lynnea Ferrier, LPN  9/32/6712  I have reviewed and agree with the above AWV documentation.   Evelina Dun, FNP

## 2019-01-21 ENCOUNTER — Ambulatory Visit: Payer: PPO | Admitting: *Deleted

## 2019-01-21 ENCOUNTER — Other Ambulatory Visit: Payer: Self-pay

## 2019-01-21 DIAGNOSIS — I10 Essential (primary) hypertension: Secondary | ICD-10-CM

## 2019-01-21 DIAGNOSIS — E1165 Type 2 diabetes mellitus with hyperglycemia: Secondary | ICD-10-CM

## 2019-01-21 DIAGNOSIS — IMO0002 Reserved for concepts with insufficient information to code with codable children: Secondary | ICD-10-CM

## 2019-01-21 DIAGNOSIS — F418 Other specified anxiety disorders: Secondary | ICD-10-CM

## 2019-01-21 DIAGNOSIS — Z794 Long term (current) use of insulin: Secondary | ICD-10-CM

## 2019-01-22 NOTE — Chronic Care Management (AMB) (Signed)
  Chronic Care Management   RN Telephone Consult Note  01/21/2019 Name: Charise Leinbach MRN: [id number] DOB: [birth year]  Referred by: Chipper Herb, MD Reason for referral : Chronic Care Management (RN Consult)  Arelene Moroni is a 68 y.o. year old female who sees Chipper Herb, MD for primary care. The CM team was consulted for assistance with care coordination and chronic disease management.   I spoke with Ms Mcquown today by telephone. The visit was primarily to introduce CCM services. We did discuss her need for patient assistance and we will schedule a future appointment to discuss further CCM needs.    Goals Addressed      Patient Stated   . "I need help with my medication costs" (pt-stated)       Current Barriers:  Marland Kitchen Knowledge Deficits related to patient assistance programs . Film/video editor.   Nurse Case Manager Clinical Goal(s):  Marland Kitchen Over the next 30 days, patient will work with CM team pharmacist to complete patient assistance forms.  Interventions:  . Reviewed medications with patient and discussed cost . Pharmacy referral for Rx Patient Assistance  Patient Self Care Activities:  . Self administers medications as prescribed . Calls pharmacy for medication refills       Ms. Dwanda Tufano was given information about Chronic Care Management services today including:  1. CCM service includes personalized support from designated clinical staff supervised by her physician, including individualized plan of care and coordination with other care providers 2. 24/7 contact phone numbers for assistance for urgent and routine care needs. 3. Service will only be billed when office clinical staff spend 20 minutes or more in a month to coordinate care. 4. Only one practitioner may furnish and bill the service in a calendar month. 5. The patient may stop CCM services at any time (effective at the end of the month) by phone call to the office staff. 6. The patient  will be responsible for cost sharing (co-pay) of up to 20% of the service fee (after annual deductible is met).  Patient agreed to services and verbal consent obtained.   Follow Up Plan:  RN will follow up with patient by telephone over the next 14 days   Chong Sicilian, RN-BC, BSN Nurse Care Manager [place] [phone]

## 2019-01-22 NOTE — Patient Instructions (Signed)
Visit Information  Goals Addressed      Patient Stated   . "I need help with my medication costs" (pt-stated)       Current Barriers:  Marland Kitchen Knowledge Deficits related to patient assistance programs . Film/video editor.   Nurse Case Manager Clinical Goal(s):  Marland Kitchen Over the next 30 days, patient will work with CM team pharmacist to complete patient assistance forms.  Interventions:  . Reviewed medications with patient and discussed cost . Pharmacy referral for Rx Patient Assistance  Patient Self Care Activities:  . Self administers medications as prescribed . Calls pharmacy for medication refills  Initial goal documentation         Orders Placed This Encounter  Procedures  . AMB Referral to [facility]    Referral Priority:   Routine    Referral Type:   Consultation    Referral Reason:   [facility] Management    Number of Visits Requested:   1    The patient verbalized understanding of instructions provided today and declined a print copy of patient instruction materials.   The CM team will reach out to the patient again over the next 14 days.   [name] was given information about Chronic Care Management services today including:  1. CCM service includes personalized support from designated clinical staff supervised by her physician, including individualized plan of care and coordination with other care providers 2. 24/7 contact phone numbers for assistance for urgent and routine care needs. 3. Service will only be billed when office clinical staff spend 20 minutes or more in a month to coordinate care. 4. Only one practitioner may furnish and bill the service in a calendar month. 5. The patient may stop CCM services at any time (effective at the end of the month) by phone call to the office staff. 6. The patient will be responsible for cost sharing (co-pay) of up to 20% of the service fee (after annual deductible is met).  Patient agreed to services and  verbal consent obtained.   Chong Sicilian, RN-BC, BSN Nurse Care Manager [facility] [phone]

## 2019-01-23 ENCOUNTER — Other Ambulatory Visit: Payer: Self-pay

## 2019-01-23 NOTE — Patient Outreach (Signed)
[facility]) Care Management  01/23/2019  [name] 1951-02-05 [id number]  TELEPHONE SCREENING Referral date: 01/22/19 Referral source: primary MD referral Referral reason: medication assistance.  Insurance: health team advantage  Attempt #1  Telephone call to patient regarding primary MD referral. Unable to reach patient. HIPAA compliant voice message left with call back phone number.   PLAN: RNCM will attempt 2nd telephone call to patient within 4 business days.  RNCM will send patient outreach letter to attempt contact   [name] RN,BSN,CCM [facility] Telephonic  [phone]

## 2019-01-27 ENCOUNTER — Encounter: Payer: PPO | Admitting: *Deleted

## 2019-01-27 ENCOUNTER — Other Ambulatory Visit: Payer: Self-pay

## 2019-01-27 NOTE — Patient Outreach (Signed)
[name]) Care Management  01/27/2019  [name] July 11, 1951 [id number]  TELEPHONE SCREENING Referral date: 01/22/19 Referral source: primary MD referral Referral reason: medication assistance.  Insurance: health team advantage  Telephone call to patient regarding primary MD referral. HIPAA verified. RNCM introduced herself and explained reason for call. Patient states she is currently in the donut hole with her medications. She states affordability for all of her medications is a concern but states she is not able to afford her insulin injections.  Patient states she was able to get 10 insulin pens for $45 but now she can only get 5 pens for $150.  Patient states, " my blood sugars are all over the place. "  Patient states she can wake up in the mornings and her blood sugars will be in the 300's.  Patient reports she also has atrial fibrillation, congestive heart failure, and high blood pressure.  Patient completed depression screening with RNCM.  Patient states she is on medication for anxiety/ depression.  She states her primary MD manages her symptoms.  RNCM discussed and offered [facility] care management services. Patient verbally agreed.  Patient declined having follow up with social worker regarding the depression symptoms. Patient states, " I just pray."  RNCM also suggested that patient report any increase symptoms of anxiety and / or depression to her primary MD for possible medication adjustment. Patient states, " my doctor knows all about it."    ASSESSMENT: PHQ2 - 6   PHQ9 - 14  PLAN;  RNCM will refer patient to [facility] care management pharmacist and health coach.   [name] RN,BSN,CCM [facility] Telephonic  [phone]

## 2019-01-28 ENCOUNTER — Ambulatory Visit: Payer: Self-pay | Admitting: Pharmacist

## 2019-01-28 ENCOUNTER — Other Ambulatory Visit: Payer: Self-pay

## 2019-01-29 ENCOUNTER — Other Ambulatory Visit: Payer: Self-pay | Admitting: Family Medicine

## 2019-01-29 NOTE — Telephone Encounter (Signed)
Will send to Dr. Laurance Flatten to notify him that patient plans to restart Toujeo.

## 2019-01-30 NOTE — Telephone Encounter (Signed)
Patient can restart Toujeo.  Please make sure that she checks her blood sugars regularly.  If the prescription needs to be refilled, please do this.

## 2019-02-03 ENCOUNTER — Ambulatory Visit: Payer: Self-pay | Admitting: Pharmacist

## 2019-02-05 ENCOUNTER — Other Ambulatory Visit: Payer: Self-pay | Admitting: Pharmacist

## 2019-02-05 ENCOUNTER — Ambulatory Visit: Payer: Self-pay | Admitting: Pharmacist

## 2019-02-05 NOTE — Patient Outreach (Addendum)
[facility]) Care Management  [place]   02/05/2019  [name] [birth year] [id number]  Reason for referral: Medication Assistance with Insulin, Jardiance  Referral source: CCM RN Current insurance: [employer] Ad[employer]age  PMHx includes but not limited to:  DMT2, CHF, HTN, HLD  Outreach:  Successful telephone call with [name].  HIPAA identifiers verified.  Patient agreeable to review medications telephonically.  She reports 95% compliance with taking her medications.  She reports she never misses her insulin injections.  She is tolerating statin well.   She reports her FBG stays in the  300s-->will report to CCM RN & PCP.  She has not had A1c checked since 08/2018 due to virus (last A1c was 8.8 in 08/2018).  Encouraged patient to reach out to office concerning lab work.  Concerned for accurated Freestyle Libre (scanner glucometer) readings versus changes in patient eating/conditions.  Consider adding metformin (unsure if patient didn't tolerate in the past-no allergy/intolerance listed, Scr would allow).  Consider increasing basal insulin (Toujeo) at night for improved FBG and decreased meal time requirements.   Patient reports hypoglycemia (BG<70) x2 this past 30 days.  She states this was due to "not feeling well these two days".  BG was 50, 60 and patient was able to correct BG accordingly.  Counseled on S/SX of hypoglycemia & correction.  Patient verbalized understanding.    Patient is now in the "coverage gap" with Medicare and reports expensive copays.  She is agreeable to participate in patient assistance programs.  Objective: Lab Results  Component Value Date   CREATININE 1.05 (H) 09/22/2018   CREATININE 0.89 09/08/2018   CREATININE 1.17 (H) 04/23/2018    Lab Results  Component Value Date   HGBA1C 8.8 (H) 09/08/2018    Lipid Panel     Component Value Date/Time   CHOL 116 09/08/2018 1224   CHOL 107 02/05/2013 1002   TRIG 87 09/08/2018  1224   TRIG 149 11/30/2015 0947   TRIG 143 02/05/2013 1002   HDL 42 09/08/2018 1224   HDL 35 (L) 11/30/2015 0947   HDL 35 (L) 02/05/2013 1002   CHOLHDL 2.8 09/08/2018 1224   CHOLHDL 3.9 09/22/2017 0518   VLDL 26 09/22/2017 0518   LDLCALC 57 09/08/2018 1224   LDLCALC 195 (H) 04/26/2014 1029   LDLCALC 43 02/05/2013 1002   LDLDIRECT 93 04/06/2015 1038    BP Readings from Last 3 Encounters:  12/04/18 (!) 164/79  09/08/18 137/74  05/07/18 133/74    Allergies  Allergen Reactions  . Iohexol      Desc: pt had syncopal episode with nausea post IV CM late 1990's,  pt has had prednisone prep with heart caths x 2 without problem  kdean 04/16/07, Onset Date: [id number]   . Ticlid [Ticlopidine Hcl] Nausea And Vomiting  . Codeine Nausea And Vomiting and Palpitations    Medications Reviewed Today    Reviewed by Ilean China, RN (Registered Nurse) on 01/21/19 at 64  Med List Status: <None>  Medication Order Taking? Sig Documenting Provider Last Dose Status Informant  ALPRAZolam (XANAX) 0.5 MG tablet [id number] Yes TAKE ONE TABLET 3 TIMES A DAY AS NEEDED. Chipper Herb, MD Taking Active   amLODipine [facility]) 5 MG tablet [id number] Yes TAKE ONE (1) TABLET EACH DAY Chipper Herb, MD Taking Active   aspirin 325 MG tablet [id number] Yes Take 325 mg by mouth daily. [provider] Taking Active   atenolol (TENORMIN) 50 MG tablet [id number] Yes  Take 1 tablet (50 mg total) by mouth 2 (two) times daily. Chipper Herb, MD Taking Active   citalopram (CELEXA) 20 MG tablet [id number] Yes TAKE ONE (1) TABLET EACH DAY Chipper Herb, MD Taking Active   Continuous Blood Gluc Sensor (FREESTYLE LIBRE 14 [place]) [place] [id number] Yes USE TO CHECK BLOOD GLUCOSE Chipper Herb, MD Taking Active   cyanocobalamin ((VITAMIN B-12)) injection 1,000 mcg [id number]   Chipper Herb, MD  Active   empagliflozin (JARDIANCE) 10 MG TABS tablet [id number] Yes Take 10 mg by mouth daily. Chipper Herb, MD Taking  Active   furosemide (LASIX) 40 MG tablet [id number] Yes TAKE ONE (1) TABLET EACH DAY Chipper Herb, MD Taking Active   insulin aspart (NOVOLOG FLEXPEN) 100 UNIT/ML FlexPen [id number] Yes Inject 35-40 Units into the skin 3 (three) times daily with meals. Chipper Herb, MD Taking Active   isosorbide mononitrate (IMDUR) 120 MG 24 hr tablet [id number] Yes TAKE ONE (1) TABLET EACH DAY Chipper Herb, MD Taking Active   lisinopril (ZESTRIL) 40 MG tablet [id number] Yes Take 1 tablet (40 mg total) by mouth daily. Chipper Herb, MD Taking Active   meclizine (ANTIVERT) 25 MG tablet [id number] Yes Take 1 tablet (25 mg total) by mouth 3 (three) times daily as needed for dizziness. Julianne Rice, MD Taking Active Self  nitroGLYCERIN (NITROSTAT) 0.4 MG SL tablet [id number] Yes DISSOLVE 1 TAB UNDER TOUNGE FOR CHEST PAIN. MAY REPEAT EVERY 5 MINUTES FOR 3 DOSES. IF NO RELIEF CALL 911 OR GO TO ER Chipper Herb, MD Taking Active   potassium chloride SA (K-DUR) 20 MEQ tablet [id number] Yes Take 2 tablets (40 mEq total) by mouth daily. Chipper Herb, MD Taking Active   rosuvastatin (CRESTOR) 40 MG tablet [id number] Yes TAKE ONE (1) TABLET EACH DAY Chipper Herb, MD Taking Active   TOUJEO SOLOSTAR 300 UNIT/ML [place] [id number] Yes INJECT 50-60 UNITS SQ DAILY Dettinger, Fransisca Kaufmann, MD Taking Active   vitamin B-12 (CYANOCOBALAMIN) 1000 MCG tablet [id number] Yes Take 1,000 mcg by mouth daily. [provider] Taking Active Self  Vitamin D, Ergocalciferol, (DRISDOL) 1.25 MG (50000 UT) CAPS capsule [id number] Yes TAKE 1 CAPSULE EVERY 7 DAYS Chipper Herb, MD Taking Active   zolpidem (AMBIEN) 10 MG tablet [id number] Yes Take 0.5 tablets (5 mg total) by mouth at bedtime as needed. Chipper Herb, MD Taking Active           Assessment:  Drugs sorted by system:  Neurologic/Psychologic: alprazolam PRN, Celexa, zolpidem  Cardiovascular: ASA 325, rosuvastatin, lisinopril, amlodipine, atenolol, isosorbide 24hr,  furosemide, Nitrates PRN  Endocrine: Toujeo, Novolog, Jardiance  Vitamins/Minerals/Supplements:  vitD/B  Medication Review Findings:  . Patient tolerating and reports compliance with statin . She is on ASA 325,  Statin, ACEi per co-morbidities   Medication Assistance Findings:   Patient Assistance Programs: Toujeo Pen made by [employer] o Income requirement met: Yes o Out-of-pocket prescription expenditure met:   Yes - Patient has met application requirements to apply for this patient assistance program.     Novolog Pen made by [place] requirement met: Yes o Out-of-pocket prescription expenditure met:   Not Applicable - Patient has met application requirements to apply for this patient assistance program.     Jardiance made by [place] requirement met: Yes o Out-of-pocket prescription expenditure met:   Not Applicable - Patient has met application requirements to apply for this patient assistance program.  Plan: . I will route patient assistance letter to [place] technician who will coordinate patient assistance program application process for medications listed above.  [facility] pharmacy technician will assist with obtaining all required documents from both patient and provider(s) and submit application(s) once completed.   . I will follow up with PCP, RN & patient regarding BGs/management  Regina Eck, PharmD, [place]  [phone]

## 2019-02-06 ENCOUNTER — Other Ambulatory Visit: Payer: Self-pay | Admitting: Pharmacy Technician

## 2019-02-06 NOTE — Patient Outreach (Signed)
[facility]) Care Management  02/06/2019  Janiya Millirons 02/09/1951 [id number]                          Medication Assistance Referral  Referral From: [facility] RPh Jenne Pane  Medication/Company: Nelva Nay / [employer] Patient application portion:  Mailed Provider application portion: Faxed  to Dr. Laurance Flatten  Medication/Company: Tobie Lords / Novo Nordisk Patient application portion:  Mailed Provider application portion: Faxed  to Dr. Laurance Flatten  Medication/Company: Vania Rea / [employer] Patient application portion:  Mailed Provider application portion: Faxed  to Dr. Laurance Flatten   Follow up:  Will follow up with patient in 7-10 business days to confirm application(s) have been received.  Maud Deed Chana Bode [place] Certified Pharmacy Technician [place] Management Direct Dial:[phone]

## 2019-02-10 ENCOUNTER — Ambulatory Visit: Payer: Self-pay | Admitting: Pharmacist

## 2019-02-19 ENCOUNTER — Ambulatory Visit: Payer: Self-pay | Admitting: Pharmacist

## 2019-02-20 ENCOUNTER — Telehealth: Payer: Self-pay | Admitting: Family Medicine

## 2019-02-20 ENCOUNTER — Other Ambulatory Visit (INDEPENDENT_AMBULATORY_CARE_PROVIDER_SITE_OTHER): Payer: PPO

## 2019-02-20 ENCOUNTER — Ambulatory Visit: Payer: PPO | Admitting: *Deleted

## 2019-02-20 ENCOUNTER — Other Ambulatory Visit: Payer: Self-pay

## 2019-02-20 DIAGNOSIS — I739 Peripheral vascular disease, unspecified: Secondary | ICD-10-CM | POA: Diagnosis not present

## 2019-02-20 DIAGNOSIS — E538 Deficiency of other specified B group vitamins: Secondary | ICD-10-CM | POA: Diagnosis not present

## 2019-02-20 DIAGNOSIS — M170 Bilateral primary osteoarthritis of knee: Secondary | ICD-10-CM | POA: Diagnosis not present

## 2019-02-20 DIAGNOSIS — E1129 Type 2 diabetes mellitus with other diabetic kidney complication: Secondary | ICD-10-CM | POA: Diagnosis not present

## 2019-02-20 DIAGNOSIS — R7989 Other specified abnormal findings of blood chemistry: Secondary | ICD-10-CM | POA: Diagnosis not present

## 2019-02-20 DIAGNOSIS — I10 Essential (primary) hypertension: Secondary | ICD-10-CM | POA: Diagnosis not present

## 2019-02-20 DIAGNOSIS — E1165 Type 2 diabetes mellitus with hyperglycemia: Secondary | ICD-10-CM | POA: Diagnosis not present

## 2019-02-20 DIAGNOSIS — I25119 Atherosclerotic heart disease of native coronary artery with unspecified angina pectoris: Secondary | ICD-10-CM | POA: Diagnosis not present

## 2019-02-20 DIAGNOSIS — M1712 Unilateral primary osteoarthritis, left knee: Secondary | ICD-10-CM | POA: Diagnosis not present

## 2019-02-20 DIAGNOSIS — Z794 Long term (current) use of insulin: Secondary | ICD-10-CM | POA: Diagnosis not present

## 2019-02-20 DIAGNOSIS — K219 Gastro-esophageal reflux disease without esophagitis: Secondary | ICD-10-CM | POA: Diagnosis not present

## 2019-02-20 DIAGNOSIS — I70213 Atherosclerosis of native arteries of extremities with intermittent claudication, bilateral legs: Secondary | ICD-10-CM | POA: Diagnosis not present

## 2019-02-20 LAB — BAYER DCA HB A1C WAIVED: HB A1C (BAYER DCA - WAIVED): 8.6 % — ABNORMAL HIGH (ref <7.0)

## 2019-02-20 MED ORDER — EMPAGLIFLOZIN 10 MG PO TABS: 10.0000 mg | ORAL_TABLET | Freq: Every day | ORAL | 3 refills | Status: DC

## 2019-02-20 NOTE — Progress Notes (Signed)
Pt given cyanocobalamin inj Tolerated well 

## 2019-02-20 NOTE — Telephone Encounter (Signed)
rx sent- patient aware

## 2019-02-21 LAB — LIPID PANEL
Chol/HDL Ratio: 2.7 ratio (ref 0.0–4.4)
Cholesterol, Total: 113 mg/dL (ref 100–199)
HDL: 42 mg/dL (ref >39)
LDL Calculated: 44 mg/dL (ref 0–99)
Triglycerides: 136 mg/dL (ref 0–149)
VLDL Cholesterol Cal: 27 mg/dL (ref 5–40)

## 2019-02-21 LAB — CBC WITH DIFFERENTIAL/PLATELET
Basophils Absolute: 0 10*3/uL (ref 0.0–0.2)
Basos: 0 %
EOS (ABSOLUTE): 0.1 10*3/uL (ref 0.0–0.4)
Eos: 2 %
Hematocrit: 41.2 % (ref 34.0–46.6)
Hemoglobin: 13.8 g/dL (ref 11.1–15.9)
Immature Grans (Abs): 0 10*3/uL (ref 0.0–0.1)
Immature Granulocytes: 0 %
Lymphocytes Absolute: 2.8 10*3/uL (ref 0.7–3.1)
Lymphs: 39 %
MCH: 29.9 pg (ref 26.6–33.0)
MCHC: 33.5 g/dL (ref 31.5–35.7)
MCV: 89 fL (ref 79–97)
Monocytes Absolute: 0.4 10*3/uL (ref 0.1–0.9)
Monocytes: 5 %
Neutrophils Absolute: 3.8 10*3/uL (ref 1.4–7.0)
Neutrophils: 54 %
Platelets: 183 10*3/uL (ref 150–450)
RBC: 4.61 x10E6/uL (ref 3.77–5.28)
RDW: 13.5 % (ref 11.7–15.4)
WBC: 7.1 10*3/uL (ref 3.4–10.8)

## 2019-02-21 LAB — VITAMIN D 25 HYDROXY (VIT D DEFICIENCY, FRACTURES): Vit D, 25-Hydroxy: 45.4 ng/mL (ref 30.0–100.0)

## 2019-02-21 LAB — BMP8+EGFR
BUN/Creatinine Ratio: 11 — ABNORMAL LOW (ref 12–28)
BUN: 12 mg/dL (ref 8–27)
CO2: 27 mmol/L (ref 20–29)
Calcium: 9.8 mg/dL (ref 8.7–10.3)
Chloride: 98 mmol/L (ref 96–106)
Creatinine, Ser: 1.1 mg/dL — ABNORMAL HIGH (ref 0.57–1.00)
GFR calc Af Amer: 59 mL/min/{1.73_m2} — ABNORMAL LOW (ref >59)
GFR calc non Af Amer: 51 mL/min/{1.73_m2} — ABNORMAL LOW (ref >59)
Glucose: 180 mg/dL — ABNORMAL HIGH (ref 65–99)
Potassium: 3.6 mmol/L (ref 3.5–5.2)
Sodium: 143 mmol/L (ref 134–144)

## 2019-02-21 LAB — HEPATIC FUNCTION PANEL
ALT: 20 IU/L (ref 0–32)
AST: 22 IU/L (ref 0–40)
Albumin: 4.5 g/dL (ref 3.8–4.8)
Alkaline Phosphatase: 59 IU/L (ref 39–117)
Bilirubin Total: 0.4 mg/dL (ref 0.0–1.2)
Bilirubin, Direct: 0.17 mg/dL (ref 0.00–0.40)
Total Protein: 6.9 g/dL (ref 6.0–8.5)

## 2019-02-21 LAB — VITAMIN B12: Vitamin B-12: 372 pg/mL (ref 232–1245)

## 2019-02-23 ENCOUNTER — Other Ambulatory Visit: Payer: Self-pay | Admitting: Pharmacist

## 2019-02-23 ENCOUNTER — Ambulatory Visit: Payer: Self-pay | Admitting: Pharmacist

## 2019-02-23 NOTE — Telephone Encounter (Signed)
Patient states that Chrystie Nose is too high and she would like something else sent in to the pharmacy. Please advise and send to the pools.

## 2019-02-23 NOTE — Telephone Encounter (Signed)
No samples patient aware. 

## 2019-02-23 NOTE — Telephone Encounter (Signed)
Please discussed with diabetic educator and clinical pharmacist about the next best option if she is not able to get samples from the drug company.

## 2019-02-25 ENCOUNTER — Ambulatory Visit: Payer: Self-pay

## 2019-02-25 ENCOUNTER — Other Ambulatory Visit: Payer: Self-pay | Admitting: *Deleted

## 2019-02-25 MED ORDER — METFORMIN HCL ER 500 MG PO TB24: 500.0000 mg | ORAL_TABLET | Freq: Every day | ORAL | 1 refills | Status: DC

## 2019-02-26 NOTE — Patient Outreach (Signed)
[facility]) Care Management  [place]  02/26/2019  Romanda Turrubiates 1951-06-13 [id number]   Reason for referral: Medication Assistance  Referral source: CCM RN Current insurance: Health Team Advantage  Outreach:  Unsuccessful telephone call attempt #1 to patient.   HIPAA compliant voicemail left requesting a return call  Plan:  -I will make another outreach attempt to patient within 3-4 business days.    Regina Eck, PharmD, [place]  [phone]

## 2019-03-02 ENCOUNTER — Other Ambulatory Visit: Payer: Self-pay

## 2019-03-02 NOTE — Patient Outreach (Signed)
[facility]) Care Management  03/02/2019  Maansi Wike [birth year] [id number]    1st outreach to the patient for initial assessment.  No answer.  HIPAA compliant voicemail left with contact information.  Plan: RN Health Coach will send letter. [place] will make outreach attempt to the patient within thirty business days.   Lazaro Arms RN, BSN, [place] Direct Dial:  [phone]  Fax: [phone]

## 2019-03-05 ENCOUNTER — Other Ambulatory Visit: Payer: Self-pay | Admitting: Pharmacy Technician

## 2019-03-05 NOTE — Patient Outreach (Signed)
[facility]) Care Management  03/05/2019  Kecia Swoboda Feb 21, 1951 [id number]    Unsuccessful call #1 placed to patient regarding patient assistance application(s) for Novolog, Toujeo and Jardiance , HIPAA compliant voicemail left. Calling to verify patient received applications.  Follow up:  Will make 2nd call attempt in 2-3 business days if call has not been returned.  Maud Deed Chana Bode [place] Certified Pharmacy Technician [place] Management Direct [phone]

## 2019-03-10 DIAGNOSIS — Z029 Encounter for administrative examinations, unspecified: Secondary | ICD-10-CM

## 2019-03-11 ENCOUNTER — Telehealth: Payer: PPO

## 2019-03-19 ENCOUNTER — Other Ambulatory Visit: Payer: Self-pay

## 2019-03-19 NOTE — Patient Outreach (Signed)
[facility]) Care Management  03/19/2019  [name] 28-Oct-1950 [id number]    RN Health Coach closing the program.  Patient is transitioning to external program [facility] for continued case management.  Lazaro Arms RN, BSN, [place] Direct Dial:  [phone]  Fax: [phone]

## 2019-03-30 ENCOUNTER — Telehealth: Payer: PPO

## 2019-03-31 ENCOUNTER — Ambulatory Visit: Payer: Self-pay

## 2019-04-06 ENCOUNTER — Other Ambulatory Visit: Payer: Self-pay | Admitting: Family Medicine

## 2019-04-07 ENCOUNTER — Other Ambulatory Visit: Payer: Self-pay | Admitting: Pharmacist

## 2019-04-07 ENCOUNTER — Other Ambulatory Visit: Payer: Self-pay | Admitting: Family Medicine

## 2019-04-07 NOTE — Telephone Encounter (Signed)
Pt aware this will not be done until Dettinger is back in the office

## 2019-04-08 NOTE — Telephone Encounter (Signed)
Patient needs an appointment ASAP, if we cannot get in before then let me know and we may send enough to get her through that visit.

## 2019-04-08 NOTE — Telephone Encounter (Signed)
Patient has apt 8/18- wanting to know if a one month supply of xanax can be sent to pharmacy. Please advise

## 2019-04-16 ENCOUNTER — Telehealth: Payer: Self-pay | Admitting: Family Medicine

## 2019-04-16 NOTE — Telephone Encounter (Signed)
Please advise 

## 2019-04-16 NOTE — Telephone Encounter (Signed)
I am okay with going ahead to do prescription, please do this prescription for a shower bar to prevent falls.  Diagnosis osteoarthritis knees or peripheral vascular insufficiency or increased fall risk

## 2019-04-17 MED ORDER — WALL GRAB BAR MISC: 0 refills | Status: DC

## 2019-04-17 NOTE — Telephone Encounter (Signed)
Patient aware.

## 2019-04-22 ENCOUNTER — Ambulatory Visit: Payer: PPO | Admitting: Family Medicine

## 2019-05-11 ENCOUNTER — Other Ambulatory Visit: Payer: Self-pay

## 2019-05-12 ENCOUNTER — Encounter: Payer: Self-pay | Admitting: Family Medicine

## 2019-05-12 ENCOUNTER — Ambulatory Visit (INDEPENDENT_AMBULATORY_CARE_PROVIDER_SITE_OTHER): Payer: PPO | Admitting: Family Medicine

## 2019-05-12 VITALS — BP 130/67 | HR 71 | Temp 94.8°F | Ht 66.0 in | Wt 184.2 lb

## 2019-05-12 DIAGNOSIS — F418 Other specified anxiety disorders: Secondary | ICD-10-CM

## 2019-05-12 DIAGNOSIS — E538 Deficiency of other specified B group vitamins: Secondary | ICD-10-CM

## 2019-05-12 DIAGNOSIS — F411 Generalized anxiety disorder: Secondary | ICD-10-CM

## 2019-05-12 DIAGNOSIS — I25119 Atherosclerotic heart disease of native coronary artery with unspecified angina pectoris: Secondary | ICD-10-CM | POA: Diagnosis not present

## 2019-05-12 DIAGNOSIS — Z794 Long term (current) use of insulin: Secondary | ICD-10-CM

## 2019-05-12 DIAGNOSIS — E1165 Type 2 diabetes mellitus with hyperglycemia: Secondary | ICD-10-CM

## 2019-05-12 DIAGNOSIS — IMO0002 Reserved for concepts with insufficient information to code with codable children: Secondary | ICD-10-CM

## 2019-05-12 DIAGNOSIS — M1712 Unilateral primary osteoarthritis, left knee: Secondary | ICD-10-CM | POA: Diagnosis not present

## 2019-05-12 LAB — BAYER DCA HB A1C WAIVED: HB A1C (BAYER DCA - WAIVED): 9.6 % — ABNORMAL HIGH (ref <7.0)

## 2019-05-12 MED ORDER — ZOLPIDEM TARTRATE 10 MG PO TABS: 5.0000 mg | ORAL_TABLET | Freq: Every evening | ORAL | 1 refills | Status: DC | PRN

## 2019-05-12 MED ORDER — WALL GRAB BAR MISC: 0 refills | Status: DC

## 2019-05-12 MED ORDER — METHYLPREDNISOLONE ACETATE 80 MG/ML IJ SUSP
80.0000 mg | Freq: Once | INTRAMUSCULAR | Status: AC
  Administered 2019-05-12: 14:00:00 80 mg via INTRAMUSCULAR

## 2019-05-12 NOTE — Progress Notes (Signed)
BP 130/67   Pulse 71   Temp (!) 94.8 F (34.9 C) (Temporal)   Ht 5' 6"  (1.676 m)   Wt 184 lb 3.2 oz (83.6 kg)   BMI 29.73 kg/m    Subjective:   Patient ID: [name], female    DOB: 08-Nov-1950, 68 y.o.   MRN: [id number]  HPI: [name] is a 68 y.o. female presenting on 05/12/2019 for Establish Care (Dwm- Check up of chronic medical conditions) and Knee Pain (Left- x 1 month )   HPI  Anxiety and depression and insomnia Currently patient is taking Celexa for anxiety depression and that she takes Ambien and or Xanax for insomnia and anxiety at bedtime but she says she does not take them both on the same night.  She says they both work about equally.  She denies any suicidal ideations or thoughts of hurting herself.  She says the Ambien 5 mg was not working as well but the 10 mg did work well. Current rx-Ambien 5 to 10 mg nightly as needed # meds rx-30, 10 mg tablets Effectiveness of current meds-working well Adverse reactions form pain meds-none  Pill count performed-No Last drug screen - n/a ( high risk q44m moderate risk q648mlow risk yearly ) Urine drug screen today- Yes [place]  If yes were their any concerning findings? -None  No flowsheet data found.   Pain contract signed on: 05/12/2019  Patient is coming in for recheck of her left knee osteoarthritis, she had an injection before about 5 months ago as she says that it started to swell up and cause her more issues over the past few weeks and would like another injection.  Denies any fevers or chills or popping or catching or giving way.  She does wake up more stiff in the morning and then it progresses from there.  Type 2 diabetes mellitus Patient comes in today for recheck of his diabetes. Patient has been currently taking Toujeo and NovoLog and Jardiance. Patient is currently on an ACE inhibitor/ARB. Patient has not seen an ophthalmologist this year. Patient is any issues with  their feet.  New lab draw today and discussed diabetes at her next visit so was only briefly discussed today  Relevant past medical, surgical, family and social history reviewed and updated as indicated. Interim medical history since our last visit reviewed. Allergies and medications reviewed and updated.  Review of Systems  Constitutional: Negative for chills and fever.  Eyes: Negative for visual disturbance.  Respiratory: Negative for chest tightness and shortness of breath.   Cardiovascular: Negative for chest pain and leg swelling.  Musculoskeletal: Positive for arthralgias and joint swelling. Negative for back pain and gait problem.  Skin: Negative for rash.  Neurological: Negative for dizziness, light-headedness and headaches.  Psychiatric/Behavioral: Negative for agitation and behavioral problems.  All other systems reviewed and are negative.   Per HPI unless specifically indicated above   Allergies as of 05/12/2019      Reactions   Iohexol     Desc: pt had syncopal episode with nausea post IV CM late 1990's,  pt has had prednisone prep with heart caths x 2 without problem  kdean 04/16/07, Onset Date: [id number] Ticlid [ticlopidine Hcl] Nausea And Vomiting   Codeine Nausea And Vomiting, Palpitations      Medication List       Accurate as of May 12, 2019  1:38 PM. If you have any questions, ask your  nurse or doctor.        STOP taking these medications   ALPRAZolam 0.5 MG tablet Commonly known as: Duanne Moron Stopped by: Fransisca Kaufmann Jaishawn Witzke, MD   metFORMIN 500 MG 24 hr tablet Commonly known as: GLUCOPHAGE-XR Stopped by: Fransisca Kaufmann Carrina Schoenberger, MD     TAKE these medications   amLODipine 5 MG tablet Commonly known as: NORVASC TAKE ONE (1) TABLET EACH DAY   aspirin 325 MG tablet Take 325 mg by mouth daily.   atenolol 50 MG tablet Commonly known as: TENORMIN Take 1 tablet (50 mg total) by mouth 2 (two) times daily.   citalopram 20 MG tablet Commonly known as: CELEXA  TAKE ONE (1) TABLET EACH DAY   empagliflozin 10 MG Tabs tablet Commonly known as: Jardiance Take 10 mg by mouth daily.   FreeStyle Libre 14 Day Sensor Misc USE TO CHECK BLOOD GLUCOSE   furosemide 40 MG tablet Commonly known as: LASIX TAKE ONE (1) TABLET EACH DAY   insulin aspart 100 UNIT/ML FlexPen Commonly known as: NovoLOG FlexPen Inject 35-40 Units into the skin 3 (three) times daily with meals.   isosorbide mononitrate 120 MG 24 hr tablet Commonly known as: IMDUR TAKE ONE (1) TABLET EACH DAY   lisinopril 40 MG tablet Commonly known as: ZESTRIL Take 1 tablet (40 mg total) by mouth daily.   meclizine 25 MG tablet Commonly known as: ANTIVERT Take 1 tablet (25 mg total) by mouth 3 (three) times daily as needed for dizziness.   nitroGLYCERIN 0.4 MG SL tablet Commonly known as: Nitrostat DISSOLVE 1 TAB UNDER TOUNGE FOR CHEST PAIN. MAY REPEAT EVERY 5 MINUTES FOR 3 DOSES. IF NO RELIEF CALL 911 OR GO TO ER   potassium chloride SA 20 MEQ tablet Commonly known as: K-DUR Take 2 tablets (40 mEq total) by mouth daily.   rosuvastatin 40 MG tablet Commonly known as: CRESTOR TAKE ONE (1) TABLET EACH DAY   Toujeo SoloStar 300 UNIT/ML Sopn Generic drug: Insulin Glargine (1 Unit Dial) INJECT 50-60 UNITS SQ DAILY   vitamin B-12 1000 MCG tablet Commonly known as: CYANOCOBALAMIN Take 1,000 mcg by mouth daily.   Vitamin D (Ergocalciferol) 1.25 MG (50000 UT) Caps capsule Commonly known as: DRISDOL TAKE 1 CAPSULE EVERY 7 DAYS   Wall Grab Bar Misc For bathtub/shower dx: M17.0, 173.9, Z91.8   zolpidem 10 MG tablet Commonly known as: AMBIEN Take 0.5-1 tablets (5-10 mg total) by mouth at bedtime as needed for sleep. What changed: See the new instructions. Changed by: Fransisca Kaufmann Ranny Wiebelhaus, MD        Objective:   BP 130/67   Pulse 71   Temp (!) 94.8 F (34.9 C) (Temporal)   Ht 5' 6"  (1.676 m)   Wt 184 lb 3.2 oz (83.6 kg)   BMI 29.73 kg/m   Wt Readings from Last 3  Encounters:  05/12/19 184 lb 3.2 oz (83.6 kg)  12/04/18 186 lb 9.6 oz (84.6 kg)  09/08/18 184 lb (83.5 kg)    Physical Exam Vitals signs and nursing note reviewed.  Constitutional:      General: She is not in acute distress.    Appearance: She is well-developed. She is not diaphoretic.  Eyes:     Conjunctiva/sclera: Conjunctivae normal.  Cardiovascular:     Rate and Rhythm: Normal rate and regular rhythm.     Heart sounds: Normal heart sounds. No murmur.  Pulmonary:     Effort: Pulmonary effort is normal. No respiratory distress.     Breath  sounds: Normal breath sounds. No wheezing.  Musculoskeletal: Normal range of motion.     Left knee: She exhibits effusion. She exhibits normal range of motion, no erythema, normal alignment, no LCL laxity, normal patellar mobility, no bony tenderness, normal meniscus and no MCL laxity. Tenderness found. Medial joint line and lateral joint line tenderness noted.  Skin:    General: Skin is warm and dry.     Findings: No rash.  Neurological:     Mental Status: She is alert and oriented to person, place, and time.     Coordination: Coordination normal.  Psychiatric:        Behavior: Behavior normal.     Knee injection: Consent form signed. Risk factors of bleeding and infection discussed with patient and patient is agreeable towards injection. Patient prepped with Betadine. Lateral approach towards injection used. Injected 80 mg of Depo-Medrol and 1 mL of 2% lidocaine. Patient tolerated procedure well and no side effects from noted. Minimal to no bleeding. Simple bandage applied after.   Assessment & Plan:   Problem List Items Addressed This Visit      Cardiovascular and Mediastinum   Coronary artery disease involving native coronary artery of native heart with angina pectoris ([place])     Endocrine   Uncontrolled type 2 diabetes mellitus with insulin therapy ([place])   Relevant Orders   CBC with Differential/Platelet   CMP14+EGFR   Lipid panel    Bayer DCA Hb A1c Waived     Other   Depression with anxiety   Relevant Orders   ToxASSURE Select 13 (MW), Urine   Generalized anxiety disorder - Primary   Relevant Medications   zolpidem (AMBIEN) 10 MG tablet   Other Relevant Orders   ToxASSURE Select 13 (MW), Urine   B12 deficiency    Other Visit Diagnoses    Primary osteoarthritis of left knee       Relevant Medications   methylPREDNISolone acetate (DEPO-MEDROL) injection 80 mg (Start on 05/12/2019  1:45 PM)      Will continue Ambien, patient says she does not take Ambien and Xanax on the same night and she would prefer trying Ambien instead of the Xanax will DC Xanax and continue with Ambien for now. Follow up plan: Return in about 4 weeks (around 06/09/2019), or if symptoms worsen or fail to improve, for diabetes and hypertension checkup.  Counseling provided for all of the vaccine components Orders Placed This Encounter  Procedures  . ToxASSURE Select 13 (MW), Urine  . CBC with Differential/Platelet  . CMP14+EGFR  . Lipid panel  . Bayer [facility] Hb A1c [place], MD [place] [facility] 05/12/2019, 1:38 PM

## 2019-05-13 LAB — CBC WITH DIFFERENTIAL/PLATELET
Basophils Absolute: 0 10*3/uL (ref 0.0–0.2)
Basos: 0 %
EOS (ABSOLUTE): 0.2 10*3/uL (ref 0.0–0.4)
Eos: 2 %
Hematocrit: 40.6 % (ref 34.0–46.6)
Hemoglobin: 13.4 g/dL (ref 11.1–15.9)
Immature Grans (Abs): 0 10*3/uL (ref 0.0–0.1)
Immature Granulocytes: 0 %
Lymphocytes Absolute: 3.2 10*3/uL — ABNORMAL HIGH (ref 0.7–3.1)
Lymphs: 33 %
MCH: 28.8 pg (ref 26.6–33.0)
MCHC: 33 g/dL (ref 31.5–35.7)
MCV: 87 fL (ref 79–97)
Monocytes Absolute: 0.6 10*3/uL (ref 0.1–0.9)
Monocytes: 6 %
Neutrophils Absolute: 5.8 10*3/uL (ref 1.4–7.0)
Neutrophils: 59 %
Platelets: 202 10*3/uL (ref 150–450)
RBC: 4.66 x10E6/uL (ref 3.77–5.28)
RDW: 13.5 % (ref 11.7–15.4)
WBC: 9.8 10*3/uL (ref 3.4–10.8)

## 2019-05-13 LAB — LIPID PANEL
Chol/HDL Ratio: 2.8 ratio (ref 0.0–4.4)
Cholesterol, Total: 109 mg/dL (ref 100–199)
HDL: 39 mg/dL — ABNORMAL LOW (ref >39)
LDL Calculated: 48 mg/dL (ref 0–99)
Triglycerides: 109 mg/dL (ref 0–149)
VLDL Cholesterol Cal: 22 mg/dL (ref 5–40)

## 2019-05-13 LAB — CMP14+EGFR
ALT: 17 IU/L (ref 0–32)
AST: 20 IU/L (ref 0–40)
Albumin/Globulin Ratio: 1.5 (ref 1.2–2.2)
Albumin: 4.3 g/dL (ref 3.8–4.8)
Alkaline Phosphatase: 63 IU/L (ref 39–117)
BUN/Creatinine Ratio: 9 — ABNORMAL LOW (ref 12–28)
BUN: 9 mg/dL (ref 8–27)
Bilirubin Total: 0.3 mg/dL (ref 0.0–1.2)
CO2: 26 mmol/L (ref 20–29)
Calcium: 9.7 mg/dL (ref 8.7–10.3)
Chloride: 99 mmol/L (ref 96–106)
Creatinine, Ser: 1 mg/dL (ref 0.57–1.00)
GFR calc Af Amer: 66 mL/min/{1.73_m2} (ref >59)
GFR calc non Af Amer: 57 mL/min/{1.73_m2} — ABNORMAL LOW (ref >59)
Globulin, Total: 2.9 g/dL (ref 1.5–4.5)
Glucose: 121 mg/dL — ABNORMAL HIGH (ref 65–99)
Potassium: 3.5 mmol/L (ref 3.5–5.2)
Sodium: 143 mmol/L (ref 134–144)
Total Protein: 7.2 g/dL (ref 6.0–8.5)

## 2019-05-14 ENCOUNTER — Other Ambulatory Visit: Payer: Self-pay

## 2019-05-14 ENCOUNTER — Ambulatory Visit (INDEPENDENT_AMBULATORY_CARE_PROVIDER_SITE_OTHER): Payer: PPO | Admitting: *Deleted

## 2019-05-14 DIAGNOSIS — E538 Deficiency of other specified B group vitamins: Secondary | ICD-10-CM | POA: Diagnosis not present

## 2019-05-14 MED ORDER — CYANOCOBALAMIN 1000 MCG/ML IJ SOLN
1000.0000 ug | INTRAMUSCULAR | Status: AC
  Administered 2019-05-14 – 2019-12-01 (×6): 1000 ug via INTRAMUSCULAR

## 2019-05-14 NOTE — Progress Notes (Signed)
Pt given Cyanocobalamin inj Tolerated well 

## 2019-05-15 LAB — TOXASSURE SELECT 13 (MW), URINE

## 2019-06-16 ENCOUNTER — Other Ambulatory Visit: Payer: Self-pay

## 2019-06-17 ENCOUNTER — Ambulatory Visit (INDEPENDENT_AMBULATORY_CARE_PROVIDER_SITE_OTHER): Payer: PPO | Admitting: Family Medicine

## 2019-06-17 ENCOUNTER — Encounter: Payer: Self-pay | Admitting: Family Medicine

## 2019-06-17 VITALS — BP 140/77 | HR 74 | Temp 94.7°F | Resp 16 | Ht 72.0 in | Wt 184.4 lb

## 2019-06-17 DIAGNOSIS — E1169 Type 2 diabetes mellitus with other specified complication: Secondary | ICD-10-CM

## 2019-06-17 DIAGNOSIS — Z23 Encounter for immunization: Secondary | ICD-10-CM

## 2019-06-17 DIAGNOSIS — IMO0002 Reserved for concepts with insufficient information to code with codable children: Secondary | ICD-10-CM

## 2019-06-17 DIAGNOSIS — F411 Generalized anxiety disorder: Secondary | ICD-10-CM

## 2019-06-17 DIAGNOSIS — E782 Mixed hyperlipidemia: Secondary | ICD-10-CM | POA: Diagnosis not present

## 2019-06-17 DIAGNOSIS — K219 Gastro-esophageal reflux disease without esophagitis: Secondary | ICD-10-CM | POA: Diagnosis not present

## 2019-06-17 DIAGNOSIS — E538 Deficiency of other specified B group vitamins: Secondary | ICD-10-CM

## 2019-06-17 DIAGNOSIS — E1165 Type 2 diabetes mellitus with hyperglycemia: Secondary | ICD-10-CM | POA: Diagnosis not present

## 2019-06-17 DIAGNOSIS — Z794 Long term (current) use of insulin: Secondary | ICD-10-CM | POA: Diagnosis not present

## 2019-06-17 DIAGNOSIS — E785 Hyperlipidemia, unspecified: Secondary | ICD-10-CM

## 2019-06-17 DIAGNOSIS — I10 Essential (primary) hypertension: Secondary | ICD-10-CM | POA: Diagnosis not present

## 2019-06-17 MED ORDER — CITALOPRAM HYDROBROMIDE 40 MG PO TABS: ORAL_TABLET | ORAL | 3 refills | Status: DC

## 2019-06-17 MED ORDER — CLONAZEPAM 0.5 MG PO TABS: 0.5000 mg | ORAL_TABLET | Freq: Every evening | ORAL | 2 refills | Status: DC | PRN

## 2019-06-17 NOTE — Progress Notes (Signed)
BP 140/77   Pulse 74   Temp (!) 94.7 F (34.8 C) (Temporal)   Resp 16   Ht 6' (1.829 m)   Wt 184 lb 6.4 oz (83.6 kg)   SpO2 97%   BMI 25.01 kg/m    Subjective:   Patient ID: [name], female    DOB: [birth year], 68 y.o.   MRN: [id number]  HPI: [name] is a 68 y.o. female presenting on 06/17/2019 for Diabetes (1 month follow up), Hypertension, Anxiety (Patient states that she wants her Xanax back instead of Ambien), and Nevus (Patient has a mole on her back that she would like looked at and states that it has been there for awhile but the color has gotten darker.)   HPI Type 2 diabetes mellitus Patient comes in today for recheck of his diabetes. Patient has been currently taking NovoLog and Toujeo and Jardiance. Patient is currently on an ACE inhibitor/ARB. Patient has not seen an ophthalmologist this year. Patient denies any issues with their feet.   Hypertension Patient is currently on amlodipine and atenolol and Imdur and lisinopril, and their blood pressure today is 140/77. Patient denies any lightheadedness or dizziness. Patient denies headaches, blurred vision, chest pains, shortness of breath, or weakness. Denies any side effects from medication and is content with current medication.   Anxiety  Patient is currently coming in for anxiety recheck.  She currently takes clonazepam and citalopram and says that it is working Current rx-clonazepam 0.5 mg nightly as needed # meds rx-30 Effectiveness of current meds-works well Adverse reactions form meds-none Pill count performed-No Last drug screen -05/15/2019 ( high risk q82m, moderate risk q53m, low risk yearly ) Urine drug screen today- No Was the [place] reviewed-yes  If yes were their any concerning findings? -None  Depression screen [facility] 2/9 06/17/2019 05/12/2019 01/27/2019 01/19/2019 12/04/2018  Decreased Interest 0 0 3 0 0  Down, Depressed, Hopeless 0 0 3 0 0  PHQ - 2 Score 0 0 6 0 0  Altered sleeping -  - 3 - -  Tired, decreased energy - - 3 - -  Change in appetite - - 1 - -  Feeling bad or failure about yourself  - - 0 - -  Trouble concentrating - - 1 - -  Moving slowly or fidgety/restless - - 0 - -  Suicidal thoughts - - 0 - -  PHQ-9 Score - - 14 - -  Difficult doing work/chores - - Somewhat difficult - -  Some recent data might be hidden      Pain contract signed on: 05/15/2019  Patient has multiple seborrheic keratosis on his back  Relevant past medical, surgical, family and social history reviewed and updated as indicated. Interim medical history since our last visit reviewed. Allergies and medications reviewed and updated.  Review of Systems  Constitutional: Negative for chills and fever.  Eyes: Negative for visual disturbance.  Respiratory: Negative for chest tightness and shortness of breath.   Cardiovascular: Negative for chest pain and leg swelling.  Musculoskeletal: Negative for back pain and gait problem.  Skin: Negative for rash.  Neurological: Negative for light-headedness and headaches.  Psychiatric/Behavioral: Positive for sleep disturbance. Negative for agitation, behavioral problems, decreased concentration, dysphoric mood, self-injury and suicidal ideas. The patient is nervous/anxious.   All other systems reviewed and are negative.   Per HPI unless specifically indicated above   Allergies as of 06/17/2019      Reactions   Iohexol  Desc: pt had syncopal episode with nausea post IV CM late 1990's,  pt has had prednisone prep with heart caths x 2 without problem  kdean 04/16/07, Onset Date: [id number]   Ticlid [ticlopidine Hcl] Nausea And Vomiting   Codeine Nausea And Vomiting, Palpitations      Medication List       Accurate as of June 17, 2019  9:06 AM. If you have any questions, ask your nurse or doctor.        amLODipine 5 MG tablet Commonly known as: NORVASC TAKE ONE (1) TABLET EACH DAY   aspirin 325 MG tablet Take 325 mg by mouth daily.    atenolol 50 MG tablet Commonly known as: TENORMIN Take 1 tablet (50 mg total) by mouth 2 (two) times daily.   citalopram 20 MG tablet Commonly known as: CELEXA TAKE ONE (1) TABLET EACH DAY   empagliflozin 10 MG Tabs tablet Commonly known as: Jardiance Take 10 mg by mouth daily.   FreeStyle Libre 14 Day Sensor Misc USE TO CHECK BLOOD GLUCOSE   furosemide 40 MG tablet Commonly known as: LASIX TAKE ONE (1) TABLET EACH DAY   insulin aspart 100 UNIT/ML FlexPen Commonly known as: NovoLOG FlexPen Inject 35-40 Units into the skin 3 (three) times daily with meals.   isosorbide mononitrate 120 MG 24 hr tablet Commonly known as: IMDUR TAKE ONE (1) TABLET EACH DAY   lisinopril 40 MG tablet Commonly known as: ZESTRIL Take 1 tablet (40 mg total) by mouth daily.   meclizine 25 MG tablet Commonly known as: ANTIVERT Take 1 tablet (25 mg total) by mouth 3 (three) times daily as needed for dizziness.   nitroGLYCERIN 0.4 MG SL tablet Commonly known as: Nitrostat DISSOLVE 1 TAB UNDER TOUNGE FOR CHEST PAIN. MAY REPEAT EVERY 5 MINUTES FOR 3 DOSES. IF NO RELIEF CALL 911 OR GO TO ER   potassium chloride SA 20 MEQ tablet Commonly known as: K-DUR Take 2 tablets (40 mEq total) by mouth daily.   rosuvastatin 40 MG tablet Commonly known as: CRESTOR TAKE ONE (1) TABLET EACH DAY   Toujeo SoloStar 300 UNIT/ML Sopn Generic drug: Insulin Glargine (1 Unit Dial) INJECT 50-60 UNITS SQ DAILY   vitamin B-12 1000 MCG tablet Commonly known as: CYANOCOBALAMIN Take 1,000 mcg by mouth daily.   Vitamin D (Ergocalciferol) 1.25 MG (50000 UT) Caps capsule Commonly known as: DRISDOL TAKE 1 CAPSULE EVERY 7 DAYS   Wall Grab Bar Misc For bathtub/shower dx: M17.0, 173.9, Z91.8   zolpidem 10 MG tablet Commonly known as: AMBIEN Take 0.5-1 tablets (5-10 mg total) by mouth at bedtime as needed for sleep.        Objective:   BP 140/77   Pulse 74   Temp (!) 94.7 F (34.8 C) (Temporal)   Resp 16    Ht 6' (1.829 m)   Wt 184 lb 6.4 oz (83.6 kg)   SpO2 97%   BMI 25.01 kg/m   Wt Readings from Last 3 Encounters:  06/17/19 184 lb 6.4 oz (83.6 kg)  05/12/19 184 lb 3.2 oz (83.6 kg)  12/04/18 186 lb 9.6 oz (84.6 kg)    Physical Exam Vitals signs and nursing note reviewed.  Constitutional:      General: She is not in acute distress.    Appearance: She is well-developed. She is not diaphoretic.  Eyes:     Conjunctiva/sclera: Conjunctivae normal.  Cardiovascular:     Rate and Rhythm: Normal rate and regular rhythm.     Heart  sounds: Normal heart sounds. No murmur.  Pulmonary:     Effort: Pulmonary effort is normal. No respiratory distress.     Breath sounds: Normal breath sounds. No wheezing.  Musculoskeletal: Normal range of motion.        General: No tenderness.  Skin:    General: Skin is warm and dry.     Findings: No rash.  Neurological:     Mental Status: She is alert and oriented to person, place, and time.     Coordination: Coordination normal.  Psychiatric:        Mood and Affect: Mood is anxious. Mood is not depressed.        Behavior: Behavior normal.        Thought Content: Thought content does not include suicidal ideation. Thought content does not include suicidal plan.       Assessment & Plan:   Problem List Items Addressed This Visit      Cardiovascular and Mediastinum   Essential hypertension     Digestive   GERD (gastroesophageal reflux disease)     Endocrine   Uncontrolled type 2 diabetes mellitus with insulin therapy ([place]) - Primary   DM type 2 with diabetic dyslipidemia ([place])     Other   Hyperlipidemia   Generalized anxiety disorder   Relevant Medications   clonazePAM (KLONOPIN) 0.5 MG tablet   citalopram (CELEXA) 40 MG tablet    Other Visit Diagnoses    Need for immunization against influenza       Relevant Orders   Flu Vaccine QUAD High Dose(Fluad) (Completed)    Increase mealtime insulin to 45 3 times daily with meals and keep  Toujeo at 2 units daily   Will increase Celexa and switch to clonazepam, stop Ambien Follow up plan: Return in about 3 months (around 09/16/2019), or if symptoms worsen or fail to improve.  Counseling provided for all of the vaccine components No orders of the defined types were placed in this encounter.   Caryl Pina, MD [facility] 06/17/2019, 9:06 AM

## 2019-06-19 ENCOUNTER — Telehealth: Payer: Self-pay | Admitting: Family Medicine

## 2019-06-19 NOTE — Telephone Encounter (Signed)
Patient up to date on pneumonia vaccine.  Patient  aware

## 2019-06-25 ENCOUNTER — Telehealth: Payer: Self-pay | Admitting: Family Medicine

## 2019-06-26 DIAGNOSIS — Z1231 Encounter for screening mammogram for malignant neoplasm of breast: Secondary | ICD-10-CM | POA: Diagnosis not present

## 2019-08-12 ENCOUNTER — Telehealth: Payer: Self-pay | Admitting: Family Medicine

## 2019-08-13 NOTE — Telephone Encounter (Signed)
Patient of Dettinger. MD that works with insurance wants you to be aware that patient cant afford medications and has stopped them. She has been getting a generic insulin from her pharmacy. FYI

## 2019-08-13 NOTE — Telephone Encounter (Signed)
Apt scheduled.  

## 2019-08-13 NOTE — Telephone Encounter (Signed)
Please schedule for a televisit if she is willing, if she still has insurance and there are ways that we could get these meds more affordable for her and we can discuss that, and also like to discuss dosing, she is on generic insulin then she needs to be checking regularly and would like to discuss where her blood sugars are running on the insulin if we decide to go forward with that then we may needed to do some adjustments.

## 2019-08-17 ENCOUNTER — Encounter: Payer: Self-pay | Admitting: Family Medicine

## 2019-08-17 ENCOUNTER — Ambulatory Visit (INDEPENDENT_AMBULATORY_CARE_PROVIDER_SITE_OTHER): Payer: PPO | Admitting: Family Medicine

## 2019-08-17 ENCOUNTER — Telehealth: Payer: Self-pay | Admitting: Family Medicine

## 2019-08-17 DIAGNOSIS — Z794 Long term (current) use of insulin: Secondary | ICD-10-CM

## 2019-08-17 DIAGNOSIS — E1165 Type 2 diabetes mellitus with hyperglycemia: Secondary | ICD-10-CM | POA: Diagnosis not present

## 2019-08-17 DIAGNOSIS — IMO0002 Reserved for concepts with insufficient information to code with codable children: Secondary | ICD-10-CM

## 2019-08-17 NOTE — Telephone Encounter (Signed)
Patient coming Wednesday, 08/20/19, at 2:00 pm for B12 shot and to [employer].

## 2019-08-17 NOTE — Progress Notes (Signed)
Virtual Visit via telephone Note  I connected with [name] on 08/17/19 at 0905 by telephone and verified that I am speaking with the correct person using two identifiers. [name] is currently located at home and no other people are currently with her during visit. The provider, Fransisca Kaufmann Dettinger, MD is located in their office at time of visit.  Call ended at 0915  I discussed the limitations, risks, security and privacy concerns of performing an evaluation and management service by telephone and the availability of in person appointments. I also discussed with the patient that there may be a patient responsible charge related to this service. The patient expressed understanding and agreed to proceed.   History and Present Illness: Patient went to bed at 1030 and she had a low blood sugar that didn't register, she went to bed and it was 168.  Had a sandwich at night and checked 3-4 hours after and it was 168.  She had 35 units of novolog at 6 pm and ate ate 430-530pm. She was down below 30 that night.  She has not had any low blood sugars overnight.  On the morning it is 245-265 typically.   No diagnosis found.  Outpatient Encounter Medications as of 08/17/2019  Medication Sig  . amLODipine (NORVASC) 5 MG tablet TAKE ONE (1) TABLET EACH DAY  . aspirin 325 MG tablet Take 325 mg by mouth daily.  Marland Kitchen atenolol (TENORMIN) 50 MG tablet Take 1 tablet (50 mg total) by mouth 2 (two) times daily.  . citalopram (CELEXA) 40 MG tablet TAKE ONE (1) TABLET EACH DAY  . clonazePAM (KLONOPIN) 0.5 MG tablet Take 1 tablet (0.5 mg total) by mouth at bedtime as needed for anxiety.  . Continuous Blood Gluc Sensor (FREESTYLE LIBRE 14 DAY SENSOR) MISC USE TO CHECK BLOOD GLUCOSE  . empagliflozin (JARDIANCE) 10 MG TABS tablet Take 10 mg by mouth daily.  . furosemide (LASIX) 40 MG tablet TAKE ONE (1) TABLET EACH DAY  . insulin aspart (NOVOLOG FLEXPEN) 100 UNIT/ML FlexPen Inject 35-40 Units  into the skin 3 (three) times daily with meals.  . isosorbide mononitrate (IMDUR) 120 MG 24 hr tablet TAKE ONE (1) TABLET EACH DAY  . lisinopril (ZESTRIL) 40 MG tablet Take 1 tablet (40 mg total) by mouth daily.  . meclizine (ANTIVERT) 25 MG tablet Take 1 tablet (25 mg total) by mouth 3 (three) times daily as needed for dizziness.  . Misc. Devices (WALL GRAB BAR) MISC For bathtub/shower dx: M17.0, 173.9, Z91.8  . nitroGLYCERIN (NITROSTAT) 0.4 MG SL tablet DISSOLVE 1 TAB UNDER TOUNGE FOR CHEST PAIN. MAY REPEAT EVERY 5 MINUTES FOR 3 DOSES. IF NO RELIEF CALL 911 OR GO TO ER  . potassium chloride SA (K-DUR) 20 MEQ tablet Take 2 tablets (40 mEq total) by mouth daily.  . rosuvastatin (CRESTOR) 40 MG tablet TAKE ONE (1) TABLET EACH DAY  . TOUJEO SOLOSTAR 300 UNIT/ML SOPN INJECT 50-60 UNITS SQ DAILY  . vitamin B-12 (CYANOCOBALAMIN) 1000 MCG tablet Take 1,000 mcg by mouth daily.  . Vitamin D, Ergocalciferol, (DRISDOL) 1.25 MG (50000 UT) CAPS capsule TAKE 1 CAPSULE EVERY 7 DAYS   Facility-Administered Encounter Medications as of 08/17/2019  Medication  . cyanocobalamin ((VITAMIN B-12)) injection 1,000 mcg    Review of Systems  Constitutional: Negative for chills and fever.  Eyes: Negative for visual disturbance.  Respiratory: Negative for chest tightness and shortness of breath.   Cardiovascular: Negative for chest pain and leg  swelling.  Musculoskeletal: Negative for back pain and gait problem.  Skin: Negative for rash.  Neurological: Positive for weakness and numbness. Negative for light-headedness and headaches.  Psychiatric/Behavioral: Negative for agitation and behavioral problems.  All other systems reviewed and are negative.   Observations/Objective: Patient sounds comfortable and in no acute distress  Assessment and Plan: Problem List Items Addressed This Visit      Endocrine   Uncontrolled type 2 diabetes mellitus with insulin therapy ([place]) - Primary       Follow Up  Instructions: Lower humalog to 30 units at night with dinner. Want her to bring her libre sensor in for a readout.     I discussed the assessment and treatment plan with the patient. The patient was provided an opportunity to ask questions and all were answered. The patient agreed with the plan and demonstrated an understanding of the instructions.   The patient was advised to call back or seek an in-person evaluation if the symptoms worsen or if the condition fails to improve as anticipated.  The above assessment and management plan was discussed with the patient. The patient verbalized understanding of and has agreed to the management plan. Patient is aware to call the clinic if symptoms persist or worsen. Patient is aware when to return to the clinic for a follow-up visit. Patient educated on when it is appropriate to go to the emergency department.    I provided 10 minutes of non-face-to-face time during this encounter.    Worthy Rancher, MD

## 2019-08-18 ENCOUNTER — Other Ambulatory Visit: Payer: Self-pay

## 2019-08-19 ENCOUNTER — Other Ambulatory Visit: Payer: Self-pay

## 2019-08-19 ENCOUNTER — Ambulatory Visit: Payer: PPO | Admitting: *Deleted

## 2019-08-19 ENCOUNTER — Ambulatory Visit: Payer: PPO

## 2019-08-19 DIAGNOSIS — IMO0002 Reserved for concepts with insufficient information to code with codable children: Secondary | ICD-10-CM

## 2019-08-25 ENCOUNTER — Other Ambulatory Visit: Payer: Self-pay

## 2019-08-26 ENCOUNTER — Ambulatory Visit: Payer: PPO | Admitting: *Deleted

## 2019-08-26 ENCOUNTER — Ambulatory Visit (INDEPENDENT_AMBULATORY_CARE_PROVIDER_SITE_OTHER): Payer: PPO

## 2019-08-26 ENCOUNTER — Other Ambulatory Visit: Payer: Self-pay

## 2019-08-26 ENCOUNTER — Other Ambulatory Visit: Payer: PPO

## 2019-08-26 DIAGNOSIS — Z794 Long term (current) use of insulin: Secondary | ICD-10-CM | POA: Diagnosis not present

## 2019-08-26 DIAGNOSIS — E538 Deficiency of other specified B group vitamins: Secondary | ICD-10-CM | POA: Diagnosis not present

## 2019-08-26 DIAGNOSIS — IMO0002 Reserved for concepts with insufficient information to code with codable children: Secondary | ICD-10-CM

## 2019-08-26 DIAGNOSIS — E1165 Type 2 diabetes mellitus with hyperglycemia: Secondary | ICD-10-CM | POA: Diagnosis not present

## 2019-08-26 DIAGNOSIS — E782 Mixed hyperlipidemia: Secondary | ICD-10-CM | POA: Diagnosis not present

## 2019-08-26 LAB — BAYER DCA HB A1C WAIVED: HB A1C (BAYER DCA - WAIVED): 9.4 % — ABNORMAL HIGH (ref <7.0)

## 2019-08-26 NOTE — Chronic Care Management (AMB) (Signed)
  Chronic Care Management   Care Coordination Note  08/26/2019 Name: Dawnna Borroel MRN: [id number] DOB: 10/14/1950  Patient came into the office for a planned injection and was supposed to bring her [place] for me to download her readings, but she left it at home.   Goals Addressed            This Visit's Progress     Patient Stated   . Diabetes Management (pt-stated)       Current Barriers:  . Chronic Disease Management support and education needs related to diabetes  Nurse Case Manager Clinical Goal(s):  Marland Kitchen Over the next 30 days, patient will meet with RN Care Manager to address diabetes management and to download Freestyle Libre readings  Interventions:  . Evaluation of current treatment plan related to diabetes and patient's adherence to plan as established by provider. . Advised patient to bring Freestyle Libre meter to next office visit . Collaborated with clinical staff at [facility] regarding download of [place] readings . RN will also instruct patient how she can provide her PCP/clinical staff access to her meter remotely  Patient Self Care Activities:  . Performs ADL's independently . Performs IADL's independently .   Initial goal documentation        Follow up plan: The care management team will reach out to the patient again over the next 30 days.   Chong Sicilian, BSN, RN-BC Embedded Chronic Care Manager [facility] [place] [facility] Direct Dial: [phone]

## 2019-08-26 NOTE — Progress Notes (Signed)
Cyanocobalamin injection given to right deltoid.  Patient tolerated well. 

## 2019-08-26 NOTE — Patient Instructions (Signed)
Visit Information  Goals Addressed            This Visit's Progress     Patient Stated   . Diabetes Management (pt-stated)       Current Barriers:  . Chronic Disease Management support and education needs related to diabetes  Nurse Case Manager Clinical Goal(s):  Marland Kitchen Over the next 30 days, patient will meet with RN Care Manager to address diabetes management and to download Freestyle Libre readings  Interventions:  . Evaluation of current treatment plan related to diabetes and patient's adherence to plan as established by provider. . Advised patient to bring Freestyle Libre meter to next office visit . Collaborated with clinical staff at [facility] regarding download of [place] readings . RN will also instruct patient how she can provide her PCP/clinical staff access to her meter remotely  Patient Self Care Activities:  . Performs ADL's independently . Performs IADL's independently .   Initial goal documentation        The patient verbalized understanding of instructions provided today and declined a print copy of patient instruction materials.   The care management team will reach out to the patient again over the next 30 days.    Chong Sicilian, BSN, RN-BC Embedded Chronic Care Manager [facility] / [place] [facility] Direct Dial: [phone]

## 2019-08-27 LAB — CMP14+EGFR
ALT: 16 IU/L (ref 0–32)
AST: 22 IU/L (ref 0–40)
Albumin/Globulin Ratio: 1.8 (ref 1.2–2.2)
Albumin: 4.4 g/dL (ref 3.8–4.8)
Alkaline Phosphatase: 69 IU/L (ref 39–117)
BUN/Creatinine Ratio: 12 (ref 12–28)
BUN: 11 mg/dL (ref 8–27)
Bilirubin Total: 0.6 mg/dL (ref 0.0–1.2)
CO2: 25 mmol/L (ref 20–29)
Calcium: 9.6 mg/dL (ref 8.7–10.3)
Chloride: 96 mmol/L (ref 96–106)
Creatinine, Ser: 0.92 mg/dL (ref 0.57–1.00)
GFR calc Af Amer: 73 mL/min/{1.73_m2} (ref >59)
GFR calc non Af Amer: 63 mL/min/{1.73_m2} (ref >59)
Globulin, Total: 2.5 g/dL (ref 1.5–4.5)
Glucose: 244 mg/dL — ABNORMAL HIGH (ref 65–99)
Potassium: 3.5 mmol/L (ref 3.5–5.2)
Sodium: 140 mmol/L (ref 134–144)
Total Protein: 6.9 g/dL (ref 6.0–8.5)

## 2019-08-27 LAB — CBC WITH DIFFERENTIAL/PLATELET
Basophils Absolute: 0 10*3/uL (ref 0.0–0.2)
Basos: 0 %
EOS (ABSOLUTE): 0.1 10*3/uL (ref 0.0–0.4)
Eos: 2 %
Hematocrit: 43.6 % (ref 34.0–46.6)
Hemoglobin: 14.5 g/dL (ref 11.1–15.9)
Immature Grans (Abs): 0 10*3/uL (ref 0.0–0.1)
Immature Granulocytes: 0 %
Lymphocytes Absolute: 2.9 10*3/uL (ref 0.7–3.1)
Lymphs: 36 %
MCH: 29.7 pg (ref 26.6–33.0)
MCHC: 33.3 g/dL (ref 31.5–35.7)
MCV: 89 fL (ref 79–97)
Monocytes Absolute: 0.4 10*3/uL (ref 0.1–0.9)
Monocytes: 5 %
Neutrophils Absolute: 4.6 10*3/uL (ref 1.4–7.0)
Neutrophils: 57 %
Platelets: 200 10*3/uL (ref 150–450)
RBC: 4.89 x10E6/uL (ref 3.77–5.28)
RDW: 13.9 % (ref 11.7–15.4)
WBC: 8.1 10*3/uL (ref 3.4–10.8)

## 2019-08-27 LAB — LIPID PANEL
Chol/HDL Ratio: 3.1 ratio (ref 0.0–4.4)
Cholesterol, Total: 118 mg/dL (ref 100–199)
HDL: 38 mg/dL — ABNORMAL LOW (ref >39)
LDL Chol Calc (NIH): 60 mg/dL (ref 0–99)
Triglycerides: 107 mg/dL (ref 0–149)
VLDL Cholesterol Cal: 20 mg/dL (ref 5–40)

## 2019-09-15 ENCOUNTER — Other Ambulatory Visit: Payer: Self-pay | Admitting: Family Medicine

## 2019-09-24 ENCOUNTER — Ambulatory Visit (INDEPENDENT_AMBULATORY_CARE_PROVIDER_SITE_OTHER): Payer: PPO | Admitting: *Deleted

## 2019-09-24 DIAGNOSIS — E1165 Type 2 diabetes mellitus with hyperglycemia: Secondary | ICD-10-CM | POA: Diagnosis not present

## 2019-09-24 DIAGNOSIS — Z794 Long term (current) use of insulin: Secondary | ICD-10-CM | POA: Diagnosis not present

## 2019-09-24 DIAGNOSIS — IMO0002 Reserved for concepts with insufficient information to code with codable children: Secondary | ICD-10-CM

## 2019-09-24 NOTE — Chronic Care Management (AMB) (Signed)
  Chronic Care Management   Follow Up Note   09/24/2019 Name: [name] MRN: [id number] DOB: 04-11-1951  Referred by: Dettinger, Fransisca Kaufmann, MD Reason for referral : Chronic Care Management   [name] is a 69 y.o. year old female who is a primary care patient of Dettinger, Fransisca Kaufmann, MD. The CCM team was consulted for assistance with chronic disease management and care coordination needs.    Review of patient status, including review of consultants reports, relevant laboratory and other test results, and collaboration with appropriate care team members and the patient's provider was performed as part of comprehensive patient evaluation and provision of chronic care management services.    I talked with Ms [name] by telephone today. She will bring her Freestyle Libre to her appointment next week so that we can download the data for her PCP to review.    RN Assessment & Care Plan     . Diabetes Management       Current Barriers:  . Chronic Disease Management support and education needs related to diabetes  Nurse Case Manager Clinical Goal(s):  Marland Kitchen Over the next 30 days, patient will meet with RN Care Manager to address diabetes management and to download Freestyle Libre readings  Interventions:  . Evaluation of current treatment plan related to diabetes and patient's adherence to plan as established by provider. . Advised patient to bring Freestyle Libre meter to B12 inj appt on 09/27/2018 . RN will download freestyle Elenor [place] data and provide to PCP. RN to train other staff in how to download results. . Discussed remote downloads but patient does not have access to email or computer  Patient Self Care Activities:  . Performs ADL's independently . Performs IADL's independently .   Please see past updates related to this goal by clicking on the "Past Updates" button in the selected goal         Follow-up Plan The care management team will reach out to the  patient again over the next 7 days.   Chong Sicilian, BSN, RN-BC Embedded Chronic Care Manager [facility] [place] [facility] Direct Dial: [phone]

## 2019-09-24 NOTE — Patient Instructions (Signed)
Visit Information  Goals Addressed            This Visit's Progress     Patient Stated   . Diabetes Management (pt-stated)       Current Barriers:  . Chronic Disease Management support and education needs related to diabetes  Nurse Case Manager Clinical Goal(s):  Marland Kitchen Over the next 30 days, patient will meet with RN Care Manager to address diabetes management and to download Freestyle Libre readings  Interventions:  . Evaluation of current treatment plan related to diabetes and patient's adherence to plan as established by provider. . Advised patient to bring Freestyle Libre meter to B12 inj appt on 09/27/2018 . RN will download freestyle [place]or [place] data and provide to PCP. RN to train other staff in how to download results. . Discussed remote downloads but patient does not have access to email or computer  Patient Self Care Activities:  . Performs ADL's independently . Performs IADL's independently .   Please see past updates related to this goal by clicking on the "Past Updates" button in the selected goal         The care management team will reach out to the patient again over the next 7 days.    Chong Sicilian, BSN, RN-BC Embedded Chronic Care Manager [facility] [place] [facility] Direct Dial: [phone]   The patient verbalized understanding of instructions provided today and declined a print copy of patient instruction materials.

## 2019-09-28 ENCOUNTER — Other Ambulatory Visit: Payer: Self-pay

## 2019-09-28 ENCOUNTER — Telehealth: Payer: Self-pay | Admitting: *Deleted

## 2019-09-28 ENCOUNTER — Encounter: Payer: Self-pay | Admitting: Family Medicine

## 2019-09-28 ENCOUNTER — Ambulatory Visit (INDEPENDENT_AMBULATORY_CARE_PROVIDER_SITE_OTHER): Payer: PPO | Admitting: *Deleted

## 2019-09-28 DIAGNOSIS — E1165 Type 2 diabetes mellitus with hyperglycemia: Secondary | ICD-10-CM

## 2019-09-28 DIAGNOSIS — Z794 Long term (current) use of insulin: Secondary | ICD-10-CM

## 2019-09-28 DIAGNOSIS — E538 Deficiency of other specified B group vitamins: Secondary | ICD-10-CM

## 2019-09-28 DIAGNOSIS — IMO0002 Reserved for concepts with insufficient information to code with codable children: Secondary | ICD-10-CM

## 2019-09-28 NOTE — Progress Notes (Signed)
Vitamin b12 injection given and tolerated well.  

## 2019-09-28 NOTE — Patient Instructions (Signed)
Visit Information  Goals Addressed            This Visit's Progress     Patient Stated   . Diabetes Management (pt-stated)       Current Barriers:  . Chronic Disease Management support and education needs related to diabetes  Nurse Case Manager Clinical Goal(s):  Marland Kitchen Over the next 30 days, patient will meet with RN Care Manager to address diabetes management and to download Freestyle Libre readings  Interventions:  . Talked with patient face-to-face . Downloaded [place] report . Reviewed report . Given to provider for review and recommendation . Advised patient that she should get a call from her provider/nurse once he has reviewed it . Encouraged patient to follow-up with PCP if she has any blood sugar readings outside of provider recommended range  Patient Self Care Activities:  . Performs ADL's independently . Performs IADL's independently   Please see past updates related to this goal by clicking on the "Past Updates" button in the selected goal        The care management team will reach out to the patient again over the next 30 days.    Chong Sicilian, BSN, RN-BC Embedded Chronic Care Manager [facility] [place] [facility] Direct Dial: [phone]   The patient verbalized understanding of instructions provided today and declined a print copy of patient instruction materials.

## 2019-09-28 NOTE — Telephone Encounter (Signed)
Patient is complaining with her legs hurting at night. She states her sister and her mother both have restless leg. Her sister is given requip and let her take one and it really helped. Patient would like a prescription.

## 2019-09-28 NOTE — Chronic Care Management (AMB) (Signed)
  Chronic Care Management   Follow Up Note   09/28/2019 Name: [name] MRN: [id number] DOB: 15-Dec-1950  Referred by: Dettinger, Fransisca Kaufmann, MD Reason for referral : No chief complaint on file.   [name] is a 69 y.o. year old female who is a primary care patient of Dettinger, Fransisca Kaufmann, MD. The CCM team was consulted for assistance with chronic disease management and care coordination needs.    Review of patient status, including review of consultants reports, relevant laboratory and other test results, and collaboration with appropriate care team members and the patient's provider was performed as part of comprehensive patient evaluation and provision of chronic care management services.      RN Care Plan   . Diabetes Management       Current Barriers:  . Chronic Disease Management support and education needs related to diabetes  Nurse Case Manager Clinical Goal(s):  Marland Kitchen Over the next 30 days, patient will meet with RN Care Manager to address diabetes management and to download Freestyle Libre readings  Interventions:  . Talked with patient face-to-face . Downloaded [place] report . Reviewed report . Given to provider for review and recommendation . Advised patient that she should get a call from her provider/nurse once he has reviewed it . Encouraged patient to follow-up with PCP if she has any blood sugar readings outside of provider recommended range  Patient Self Care Activities:  . Performs ADL's independently . Performs IADL's independently   Please see past updates related to this goal by clicking on the "Past Updates" button in the selected goal         Follow-up Plan The care management team will reach out to the patient again over the next 30 days.    Chong Sicilian, BSN, RN-BC Embedded Chronic Care Manager [facility] [place] [facility] Direct Dial: [phone]

## 2019-09-29 NOTE — Telephone Encounter (Signed)
Pt scheduled for televisit with Dr Warrick Parisian 09/30/19 at 2:40 and pt aware.

## 2019-09-29 NOTE — Telephone Encounter (Signed)
Requip is a good medication for something like that, does have some interactions with some of her medications so we would have to discuss what we can change or what we can move, this would best be done in a visit, can be a virtual or phone visit.  Please schedule her for such

## 2019-09-30 ENCOUNTER — Ambulatory Visit (INDEPENDENT_AMBULATORY_CARE_PROVIDER_SITE_OTHER): Payer: PPO | Admitting: Family Medicine

## 2019-09-30 ENCOUNTER — Encounter: Payer: Self-pay | Admitting: Family Medicine

## 2019-09-30 DIAGNOSIS — G2581 Restless legs syndrome: Secondary | ICD-10-CM

## 2019-09-30 MED ORDER — ROPINIROLE HCL 0.5 MG PO TABS: 0.5000 mg | ORAL_TABLET | Freq: Every day | ORAL | 3 refills | Status: DC

## 2019-09-30 NOTE — Progress Notes (Signed)
Virtual Visit via telephone Note  I connected with [name] on 09/30/19 at 1456 by telephone and verified that I am speaking with the correct person using two identifiers. [name] is currently located at home and no other people are currently with her during visit. The provider, Fransisca Kaufmann Chantrice Hagg, MD is located in their office at time of visit.  Call ended at 1506  I discussed the limitations, risks, security and privacy concerns of performing an evaluation and management service by telephone and the availability of in person appointments. I also discussed with the patient that there may be a patient responsible charge related to this service. The patient expressed understanding and agreed to proceed.   History and Present Illness: Patient is calling in today because she has been having restless leg.  She has been fighting this for some time and has been using the clonazepam at x2 help with things but she tried one of her siblings Requip and feels like it worked a lot better and she would like to try that.  She says restless leg does run in the family.  She did not have any side effects from it and she says last night when she did not have it she was up every couple hours with this restless leg just having to move around.  1. RLS (restless legs syndrome)     Outpatient Encounter Medications as of 09/30/2019  Medication Sig  . amLODipine (NORVASC) 5 MG tablet TAKE ONE (1) TABLET EACH DAY  . aspirin 325 MG tablet Take 325 mg by mouth daily.  Marland Kitchen atenolol (TENORMIN) 50 MG tablet Take 1 tablet (50 mg total) by mouth 2 (two) times daily.  . citalopram (CELEXA) 40 MG tablet TAKE ONE (1) TABLET EACH DAY  . clonazePAM (KLONOPIN) 0.5 MG tablet Take 1 tablet (0.5 mg total) by mouth at bedtime as needed for anxiety.  . Continuous Blood Gluc Sensor (FREESTYLE LIBRE 14 DAY SENSOR) MISC USE TO CHECK BLOOD GLUCOSE  . empagliflozin (JARDIANCE) 10 MG TABS tablet Take 10 mg by mouth  daily.  . furosemide (LASIX) 40 MG tablet TAKE ONE (1) TABLET EACH DAY  . isosorbide mononitrate (IMDUR) 120 MG 24 hr tablet TAKE ONE (1) TABLET EACH DAY  . lisinopril (ZESTRIL) 40 MG tablet Take 1 tablet (40 mg total) by mouth daily.  . meclizine (ANTIVERT) 25 MG tablet Take 1 tablet (25 mg total) by mouth 3 (three) times daily as needed for dizziness.  . Misc. Devices (WALL GRAB BAR) MISC For bathtub/shower dx: M17.0, 173.9, Z91.8  . nitroGLYCERIN (NITROSTAT) 0.4 MG SL tablet DISSOLVE 1 TAB UNDER TOUNGE FOR CHEST PAIN. MAY REPEAT EVERY 5 MINUTES FOR 3 DOSES. IF NO RELIEF CALL 911 OR GO TO ER  . NOVOLOG FLEXPEN 100 UNIT/ML FlexPen INJECT 35-40 UNITS SQ 3 TIMES DAILY WITH MEALS  . potassium chloride SA (K-DUR) 20 MEQ tablet Take 2 tablets (40 mEq total) by mouth daily.  Marland Kitchen rOPINIRole (REQUIP) 0.5 MG tablet Take 1 tablet (0.5 mg total) by mouth at bedtime.  . rosuvastatin (CRESTOR) 40 MG tablet TAKE ONE (1) TABLET EACH DAY  . TOUJEO SOLOSTAR 300 UNIT/ML SOPN INJECT 50-60 UNITS SQ DAILY  . vitamin B-12 (CYANOCOBALAMIN) 1000 MCG tablet Take 1,000 mcg by mouth daily.  . Vitamin D, Ergocalciferol, (DRISDOL) 1.25 MG (50000 UT) CAPS capsule TAKE 1 CAPSULE EVERY 7 DAYS   Facility-Administered Encounter Medications as of 09/30/2019  Medication  . cyanocobalamin ((VITAMIN B-12)) injection 1,000  mcg    Review of Systems  Constitutional: Positive for fatigue. Negative for chills and fever.  Respiratory: Negative for chest tightness and shortness of breath.   Cardiovascular: Negative for chest pain and leg swelling.  Musculoskeletal: Negative for back pain and gait problem.  Skin: Negative for rash.  Psychiatric/Behavioral: Positive for sleep disturbance. Negative for agitation, behavioral problems and dysphoric mood. The patient is not nervous/anxious.   All other systems reviewed and are negative.   Observations/Objective: Patient sounds comfortable and in no acute distress  Assessment and  Plan: Problem List Items Addressed This Visit    None    Visit Diagnoses    RLS (restless legs syndrome)    -  Primary   Relevant Medications   rOPINIRole (REQUIP) 0.5 MG tablet      Will start Requip and patient will stop her clonazepam, do not use on the same nights. Follow up plan: Return in about 3 months (around 12/29/2019), or if symptoms worsen or fail to improve.     I discussed the assessment and treatment plan with the patient. The patient was provided an opportunity to ask questions and all were answered. The patient agreed with the plan and demonstrated an understanding of the instructions.   The patient was advised to call back or seek an in-person evaluation if the symptoms worsen or if the condition fails to improve as anticipated.  The above assessment and management plan was discussed with the patient. The patient verbalized understanding of and has agreed to the management plan. Patient is aware to call the clinic if symptoms persist or worsen. Patient is aware when to return to the clinic for a follow-up visit. Patient educated on when it is appropriate to go to the emergency department.    I provided 10 minutes of non-face-to-face time during this encounter.    Worthy Rancher, MD

## 2019-10-21 ENCOUNTER — Telehealth: Payer: Self-pay | Admitting: Family Medicine

## 2019-10-21 NOTE — Telephone Encounter (Signed)
Have her double it up and take 2 over the next few days and let me know in a week if it is not working, if it is working then we can send a new prescription

## 2019-10-21 NOTE — Telephone Encounter (Signed)
Patient aware and verbalizes understanding. 

## 2019-10-22 ENCOUNTER — Ambulatory Visit: Payer: PPO | Admitting: *Deleted

## 2019-10-22 DIAGNOSIS — E1165 Type 2 diabetes mellitus with hyperglycemia: Secondary | ICD-10-CM

## 2019-10-22 DIAGNOSIS — IMO0002 Reserved for concepts with insufficient information to code with codable children: Secondary | ICD-10-CM

## 2019-10-22 NOTE — Chronic Care Management (AMB) (Signed)
  Chronic Care Management   Follow Up Note   10/22/2019 Name: [name] MRN: [id number] DOB: 1951/03/25  Referred by: Dettinger, Fransisca Kaufmann, MD Reason for referral : Chronic Care Management (Care coordination)   [name] is a 69 y.o. year old female who is a primary care patient of Dettinger, Fransisca Kaufmann, MD. The CCM team was consulted for assistance with chronic disease management and care coordination needs.    Review of patient status, including review of consultants reports, relevant laboratory and other test results, and collaboration with appropriate care team members and the patient's provider was performed as part of comprehensive patient evaluation and provision of chronic care management services.     RN Care Plan   . Diabetes Management (pt-stated)       Current Barriers:  . Chronic Disease Management support and education needs related to diabetes  Nurse Case Manager Clinical Goal(s):  Marland Kitchen Over the next 30 days, patient will meet with RN Care Manager to address diabetes management and to download Freestyle Libre readings  Interventions:  . Chart reviewed o Freestyle Elenor Legato report has been uploaded into EMR . RN will collaborate with provider regarding his thoughts on the report and if any medication adjustments are needed . RN will f/u with patient after talking with PCP  Patient Self Care Activities:  . Performs ADL's independently . Performs IADL's independently   Please see past updates related to this goal by clicking on the "Past Updates" button in the selected goal          Plan:   The care management team will reach out to the patient again over the next 15 days.    Chong Sicilian, BSN, RN-BC Embedded Chronic Care Manager [facility] [place] [facility] Direct Dial: [phone]

## 2019-10-22 NOTE — Patient Instructions (Signed)
  Goals Addressed            This Visit's Progress     Patient Stated   . Diabetes Management (pt-stated)       Current Barriers:  . Chronic Disease Management support and education needs related to diabetes  Nurse Case Manager Clinical Goal(s):  Marland Kitchen Over the next 30 days, patient will meet with RN Care Manager to address diabetes management and to download Freestyle Libre readings  Interventions:  . Chart reviewed o Freestyle Elenor Leg[place] report has been uploaded into EMR . RN will collaborate with provider regarding his thoughts on the report and if any medication adjustments are needed . RN will f/u with patient after talking with PCP  Patient Self Care Activities:  . Performs ADL's independently . Performs IADL's independently   Please see past updates related to this goal by clicking on the "Past Updates" button in the selected goal       Chong Sicilian, BSN, RN-BC [place] [facility] [place]: [phone]

## 2019-10-27 ENCOUNTER — Ambulatory Visit: Payer: PPO | Admitting: *Deleted

## 2019-10-27 DIAGNOSIS — IMO0002 Reserved for concepts with insufficient information to code with codable children: Secondary | ICD-10-CM

## 2019-10-27 DIAGNOSIS — E1165 Type 2 diabetes mellitus with hyperglycemia: Secondary | ICD-10-CM

## 2019-10-27 NOTE — Patient Instructions (Signed)
Visit Information  Goals Addressed            This Visit's Progress     Patient Stated   . Diabetes Management (pt-stated)       Current Barriers:  . Chronic Disease Management support and education needs related to diabetes  Nurse Case Manager Clinical Goal(s):  Marland Kitchen Over the next 30 days, patient will meet with RN Care Manager to address diabetes management and to download Freestyle Libre readings  Interventions:  . Collaborated with PCP regarding Freestyle Libre report o Recommended increasing Toujeo by 4 units per day . Talked with patient by telephone o She reports that she's still having lows and woke up with a blood sugar of 50 this morning o She's taking Toujeo and Jardiance at night . Recommended to keep current Toujeo dosage for now and to switch Jardiance to morning . Continue to check and record blood sugars and notify me or PCP if they are outside of the recommended range . RN will update PCP . RN will follow-up with patient over the next 10 days to discuss blood sugar readings  Patient Self Care Activities:  . Performs ADL's independently . Performs IADL's independently   Please see past updates related to this goal by clicking on the "Past Updates" button in the selected goal         The care management team will reach out to the patient again over the next 10 days.    Chong Sicilian, BSN, RN-BC Embedded Chronic Care Manager [facility] [place] [facility] Direct Dial: [phone]   The patient verbalized understanding of instructions provided today and declined a print copy of patient instruction materials.

## 2019-10-27 NOTE — Chronic Care Management (AMB) (Signed)
Chronic Care Management   Follow Up Note   10/27/2019 Name: [name] MRN: [id number] DOB: 1951/01/04  Referred by: Dettinger, Fransisca Kaufmann, MD Reason for referral : Chronic Care Management (RN follow up)   [name] is a 69 y.o. year old female who is a primary care patient of Dettinger, Fransisca Kaufmann, MD. The CCM team was consulted for assistance with chronic disease management and care coordination needs.    Review of patient status, including review of consultants reports, relevant laboratory and other test results, and collaboration with appropriate care team members and the patient's provider was performed as part of comprehensive patient evaluation and provision of chronic care management services.     Outpatient Encounter Medications as of 10/27/2019  Medication Sig Note  . empagliflozin (JARDIANCE) 10 MG TABS tablet Take 10 mg by mouth daily.   Marland Kitchen NOVOLOG FLEXPEN 100 UNIT/ML FlexPen INJECT 35-40 UNITS SQ 3 TIMES DAILY WITH MEALS   . TOUJEO SOLOSTAR 300 UNIT/ML SOPN INJECT 50-60 UNITS SQ DAILY 10/27/2019: Pt reports taking 50 units a day  . amLODipine (NORVASC) 5 MG tablet TAKE ONE (1) TABLET EACH DAY   . aspirin 325 MG tablet Take 325 mg by mouth daily.   Marland Kitchen atenolol (TENORMIN) 50 MG tablet Take 1 tablet (50 mg total) by mouth 2 (two) times daily.   . citalopram (CELEXA) 40 MG tablet TAKE ONE (1) TABLET EACH DAY   . clonazePAM (KLONOPIN) 0.5 MG tablet Take 1 tablet (0.5 mg total) by mouth at bedtime as needed for anxiety.   . Continuous Blood Gluc Sensor (FREESTYLE LIBRE 14 DAY SENSOR) MISC USE TO CHECK BLOOD GLUCOSE   . furosemide (LASIX) 40 MG tablet TAKE ONE (1) TABLET EACH DAY   . isosorbide mononitrate (IMDUR) 120 MG 24 hr tablet TAKE ONE (1) TABLET EACH DAY   . lisinopril (ZESTRIL) 40 MG tablet Take 1 tablet (40 mg total) by mouth daily.   . meclizine (ANTIVERT) 25 MG tablet Take 1 tablet (25 mg total) by mouth 3 (three) times daily as needed for dizziness.   .  Misc. 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 nitroGLYCERIN (NITROSTAT) 0.4 MG SL tablet DISSOLVE 1 TAB UNDER TOUNGE FOR CHEST PAIN. MAY REPEAT EVERY 5 MINUTES FOR 3 DOSES. IF NO RELIEF CALL 911 OR GO TO ER   . potassium chloride SA (K-DUR) 20 MEQ tablet Take 2 tablets (40 mEq total) by mouth daily.   Marland Kitchen rOPINIRole (REQUIP) 0.5 MG tablet Take 1 tablet (0.5 mg total) by mouth at bedtime.   . rosuvastatin (CRESTOR) 40 MG tablet TAKE ONE (1) TABLET EACH DAY   . vitamin B-12 (CYANOCOBALAMIN) 1000 MCG tablet Take 1,000 mcg by mouth daily.   . Vitamin D, Ergocalciferol, (DRISDOL) 1.25 MG (50000 UT) CAPS capsule TAKE 1 CAPSULE EVERY 7 DAYS    Facility-Administered Encounter Medications as of 10/27/2019  Medication  . cyanocobalamin ((VITAMIN B-12)) injection 1,000 mcg      RN Care Plan   . Diabetes Management       Current Barriers:  . Chronic Disease Management support and education needs related to diabetes  Nurse Case Manager Clinical Goal(s):  Marland Kitchen Over the next 30 days, patient will meet with RN Care Manager to address diabetes management and to download Freestyle Libre readings  Interventions:  . Collaborated with PCP regarding Freestyle Libre report o Recommended increasing Toujeo by 4 units per day . Talked with patient by  telephone o She reports that she's still having lows and woke up with a blood sugar of 50 this morning o She's taking Toujeo and Jardiance at night . Recommended to keep current Toujeo dosage for now and to switch Jardiance to morning . Continue to check and record blood sugars and notify me or PCP if they are outside of the recommended range . RN will update PCP . RN will follow-up with patient over the next 10 days to discuss blood sugar readings  Patient Self Care Activities:  . Performs ADL's independently . Performs IADL's independently   Please see past updates related to this goal by clicking on the "Past Updates" button  in the selected goal          Follow-up Plan:   The care management team will reach out to the patient again over the next 10 days.    Chong Sicilian, BSN, RN-BC Embedded Chronic Care Manager [facility] [place] [facility] Direct Dial: [phone]

## 2019-10-28 ENCOUNTER — Other Ambulatory Visit: Payer: Self-pay

## 2019-10-29 ENCOUNTER — Ambulatory Visit: Payer: PPO

## 2019-11-02 ENCOUNTER — Ambulatory Visit (INDEPENDENT_AMBULATORY_CARE_PROVIDER_SITE_OTHER): Payer: PPO | Admitting: *Deleted

## 2019-11-02 DIAGNOSIS — IMO0002 Reserved for concepts with insufficient information to code with codable children: Secondary | ICD-10-CM

## 2019-11-02 DIAGNOSIS — E1165 Type 2 diabetes mellitus with hyperglycemia: Secondary | ICD-10-CM

## 2019-11-02 NOTE — Patient Instructions (Signed)
Visit Information  Goals Addressed            This Visit's Progress     Patient Stated   . Diabetes Management (pt-stated)       Current Barriers:  . Chronic Disease Management support and education needs related to diabetes  Nurse Case Manager Clinical Goal(s):  Marland Kitchen Over the next 30 days, patient will meet with RN Care Manager to address diabetes management and to download Freestyle Libre readings  Interventions:  . Talked with patient by telephone o Reports two episodes of blood sugar in the 40s during the night this week. It woke her up from her sleep. She ate something and it improved.  . Previously recommended that she switch Jardiance to morning from night . Continue to check and record blood sugars and notify me or PCP if they are outside of the recommended range . RN will update PCP . RN will follow-up with patient over the next 5 days to discuss PCP recommendations  Patient Self Care Activities:  . Performs ADL's independently . Performs IADL's independently   Please see past updates related to this goal by clicking on the "Past Updates" button in the selected goal        The care management team will reach out to the patient again over the next 5 days.    Chong Sicilian, BSN, RN-BC Embedded Chronic Care Manager [facility] [place] [facility] Direct Dial: [phone]   The patient verbalized understanding of instructions provided today and declined a print copy of patient instruction materials.

## 2019-11-02 NOTE — Chronic Care Management (AMB) (Signed)
Chronic Care Management   Follow Up Note   11/02/2019 Name: [name] MRN: [id number] DOB: 07-28-1951  Referred by: Dettinger, Fransisca Kaufmann, MD Reason for referral : Chronic Care Management (RN follow up)   [name] is a 69 y.o. year old female who is a primary care patient of Dettinger, Fransisca Kaufmann, MD. The CCM team was consulted for assistance with chronic disease management and care coordination needs.    Review of patient status, including review of consultants reports, relevant laboratory and other test results, and collaboration with appropriate care team members and the patient's provider was performed as part of comprehensive patient evaluation and provision of chronic care management services.    Outpatient Encounter Medications as of 11/02/2019  Medication Sig Note  . amLODipine (NORVASC) 5 MG tablet TAKE ONE (1) TABLET EACH DAY   . aspirin 325 MG tablet Take 325 mg by mouth daily.   Marland Kitchen atenolol (TENORMIN) 50 MG tablet Take 1 tablet (50 mg total) by mouth 2 (two) times daily.   . citalopram (CELEXA) 40 MG tablet TAKE ONE (1) TABLET EACH DAY   . clonazePAM (KLONOPIN) 0.5 MG tablet Take 1 tablet (0.5 mg total) by mouth at bedtime as needed for anxiety.   . Continuous Blood Gluc Sensor (FREESTYLE LIBRE 14 DAY SENSOR) MISC USE TO CHECK BLOOD GLUCOSE   . empagliflozin (JARDIANCE) 10 MG TABS tablet Take 10 mg by mouth daily.   . furosemide (LASIX) 40 MG tablet TAKE ONE (1) TABLET EACH DAY   . isosorbide mononitrate (IMDUR) 120 MG 24 hr tablet TAKE ONE (1) TABLET EACH DAY   . lisinopril (ZESTRIL) 40 MG tablet Take 1 tablet (40 mg total) by mouth daily.   . meclizine (ANTIVERT) 25 MG tablet Take 1 tablet (25 mg total) by mouth 3 (three) times daily as needed for dizziness.   . Misc. 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 nitroGLYCERIN (NITROSTAT) 0.4 MG SL tablet DISSOLVE 1 TAB UNDER TOUNGE FOR CHEST PAIN. MAY REPEAT EVERY 5 MINUTES FOR  3 DOSES. IF NO RELIEF CALL 911 OR GO TO ER   . NOVOLOG FLEXPEN 100 UNIT/ML FlexPen INJECT 35-40 UNITS SQ 3 TIMES DAILY WITH MEALS   . potassium chloride SA (K-DUR) 20 MEQ tablet Take 2 tablets (40 mEq total) by mouth daily.   Marland Kitchen rOPINIRole (REQUIP) 0.5 MG tablet Take 1 tablet (0.5 mg total) by mouth at bedtime.   . rosuvastatin (CRESTOR) 40 MG tablet TAKE ONE (1) TABLET EACH DAY   . TOUJEO SOLOSTAR 300 UNIT/ML SOPN INJECT 50-60 UNITS SQ DAILY 10/27/2019: Pt reports taking 50 units a day  . vitamin B-12 (CYANOCOBALAMIN) 1000 MCG tablet Take 1,000 mcg by mouth daily.   . Vitamin D, Ergocalciferol, (DRISDOL) 1.25 MG (50000 UT) CAPS capsule TAKE 1 CAPSULE EVERY 7 DAYS    Facility-Administered Encounter Medications as of 11/02/2019  Medication  . cyanocobalamin ((VITAMIN B-12)) injection 1,000 mcg     RN Care Plan   . Diabetes Management        Current Barriers:  . Chronic Disease Management support and education needs related to diabetes  Nurse Case Manager Clinical Goal(s):  Marland Kitchen Over the next 30 days, patient will meet with RN Care Manager to address diabetes management and to download Freestyle Libre readings  Interventions:  . Talked with patient by telephone o Reports two episodes of blood sugar in the 40s during the night this week. It woke  her up from her sleep. She ate something and it improved.  . Previously recommended that she switch Jardiance to morning from night . Continue to check and record blood sugars and notify me or PCP if they are outside of the recommended range . RN will update PCP . RN will follow-up with patient over the next 5 days to discuss PCP recommendations  Patient Self Care Activities:  . Performs ADL's independently . Performs IADL's independently   Please see past updates related to this goal by clicking on the "Past Updates" button in the selected goal          Follow-up Plan:   The care management team will reach out to the patient again over the  next 5 days.   Chong Sicilian, BSN, RN-BC Embedded Chronic Care Manager [facility] [place] [facility] Direct Dial: [phone]

## 2019-11-03 ENCOUNTER — Other Ambulatory Visit: Payer: Self-pay

## 2019-11-03 ENCOUNTER — Ambulatory Visit: Payer: PPO

## 2019-11-03 ENCOUNTER — Ambulatory Visit (INDEPENDENT_AMBULATORY_CARE_PROVIDER_SITE_OTHER): Payer: PPO | Admitting: *Deleted

## 2019-11-03 DIAGNOSIS — Z794 Long term (current) use of insulin: Secondary | ICD-10-CM | POA: Diagnosis not present

## 2019-11-03 DIAGNOSIS — E538 Deficiency of other specified B group vitamins: Secondary | ICD-10-CM | POA: Diagnosis not present

## 2019-11-03 DIAGNOSIS — E1165 Type 2 diabetes mellitus with hyperglycemia: Secondary | ICD-10-CM

## 2019-11-03 DIAGNOSIS — IMO0002 Reserved for concepts with insufficient information to code with codable children: Secondary | ICD-10-CM

## 2019-11-03 NOTE — Chronic Care Management (AMB) (Signed)
Chronic Care Management   Follow Up Note   11/03/2019 Name: [name] MRN: [id number] DOB: Feb 19, 1951  Referred by: Dettinger, Fransisca Kaufmann, MD Reason for referral : Chronic Care Management (Collaboration with Diabetes Educator)   [name] is a 69 y.o. year old female who is a primary care patient of Dettinger, Fransisca Kaufmann, MD. The CCM team was consulted for assistance with chronic disease management and care coordination needs.    Review of patient status, including review of consultants reports, relevant laboratory and other test results, and collaboration with appropriate care team members and the patient's provider was performed as part of comprehensive patient evaluation and provision of chronic care management services.    Outpatient Encounter Medications as of 11/03/2019  Medication Sig Note  . amLODipine (NORVASC) 5 MG tablet TAKE ONE (1) TABLET EACH DAY   . aspirin 325 MG tablet Take 325 mg by mouth daily.   Marland Kitchen atenolol (TENORMIN) 50 MG tablet Take 1 tablet (50 mg total) by mouth 2 (two) times daily.   . citalopram (CELEXA) 40 MG tablet TAKE ONE (1) TABLET EACH DAY   . clonazePAM (KLONOPIN) 0.5 MG tablet Take 1 tablet (0.5 mg total) by mouth at bedtime as needed for anxiety.   . Continuous Blood Gluc Sensor (FREESTYLE LIBRE 14 DAY SENSOR) MISC USE TO CHECK BLOOD GLUCOSE   . empagliflozin (JARDIANCE) 10 MG TABS tablet Take 10 mg by mouth daily.   . furosemide (LASIX) 40 MG tablet TAKE ONE (1) TABLET EACH DAY   . isosorbide mononitrate (IMDUR) 120 MG 24 hr tablet TAKE ONE (1) TABLET EACH DAY   . lisinopril (ZESTRIL) 40 MG tablet Take 1 tablet (40 mg total) by mouth daily.   . meclizine (ANTIVERT) 25 MG tablet Take 1 tablet (25 mg total) by mouth 3 (three) times daily as needed for dizziness.   . Misc. 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 nitroGLYCERIN (NITROSTAT) 0.4 MG SL tablet DISSOLVE 1 TAB UNDER TOUNGE FOR CHEST PAIN. MAY  REPEAT EVERY 5 MINUTES FOR 3 DOSES. IF NO RELIEF CALL 911 OR GO TO ER   . NOVOLOG FLEXPEN 100 UNIT/ML FlexPen INJECT 35-40 UNITS SQ 3 TIMES DAILY WITH MEALS   . potassium chloride SA (K-DUR) 20 MEQ tablet Take 2 tablets (40 mEq total) by mouth daily.   Marland Kitchen rOPINIRole (REQUIP) 0.5 MG tablet Take 1 tablet (0.5 mg total) by mouth at bedtime.   . rosuvastatin (CRESTOR) 40 MG tablet TAKE ONE (1) TABLET EACH DAY   . TOUJEO SOLOSTAR 300 UNIT/ML SOPN INJECT 50-60 UNITS SQ DAILY 10/27/2019: Pt reports taking 50 units a day  . vitamin B-12 (CYANOCOBALAMIN) 1000 MCG tablet Take 1,000 mcg by mouth daily.   . Vitamin D, Ergocalciferol, (DRISDOL) 1.25 MG (50000 UT) CAPS capsule TAKE 1 CAPSULE EVERY 7 DAYS    Facility-Administered Encounter Medications as of 11/03/2019  Medication  . cyanocobalamin ((VITAMIN B-12)) injection 1,000 mcg     RN Care Plan   . Diabetes Management       Current Barriers:  . Chronic Disease Management support and education needs related to diabetes  Nurse Case Manager Clinical Goal(s):  Marland Kitchen Over the next 30 days, patient will work with the RN CCM to discuss diabetes management  Interventions:  . Chart reviewed . Collaborated with diabetes educator regarding freestyle libre readings and medication regimen . Will Collaborate with PCP regarding DE recommendations . Will follow-up with patient after  collaboration with PCP  Patient Self Care Activities:  . Performs ADL's independently . Performs IADL's independently   Please see past updates related to this goal by clicking on the "Past Updates" button in the selected goal          Plan:   The care management team will reach out to the patient again over the next 14 days.    Chong Sicilian, BSN, RN-BC Embedded Chronic Care Manager [facility] / [place] [facility] Direct Dial: [phone]

## 2019-11-03 NOTE — Patient Instructions (Signed)
Visit Information  Goals Addressed            This Visit's Progress     Patient Stated   . Diabetes Management (pt-stated)       Current Barriers:  . Chronic Disease Management support and education needs related to diabetes  Nurse Case Manager Clinical Goal(s):  Marland Kitchen Over the next 30 days, patient will work with the RN CCM to discuss diabetes management  Interventions:  . Chart reviewed . Collaborated with diabetes educator regarding freestyle libre readings and medication regimen . Will Collaborate with PCP regarding DE recommendations . Will follow-up with patient after collaboration with PCP  Patient Self Care Activities:  . Performs ADL's independently . Performs IADL's independently   Please see past updates related to this goal by clicking on the "Past Updates" button in the selected goal       Chong Sicilian, BSN, RN-BC [place] / [place]: [phone]

## 2019-11-04 ENCOUNTER — Other Ambulatory Visit: Payer: Self-pay | Admitting: Family Medicine

## 2019-11-05 ENCOUNTER — Ambulatory Visit: Payer: PPO | Admitting: *Deleted

## 2019-11-05 DIAGNOSIS — IMO0002 Reserved for concepts with insufficient information to code with codable children: Secondary | ICD-10-CM

## 2019-11-05 DIAGNOSIS — E1165 Type 2 diabetes mellitus with hyperglycemia: Secondary | ICD-10-CM

## 2019-11-05 NOTE — Patient Instructions (Signed)
Visit Information  Goals Addressed            This Visit's Progress     Patient Stated   . Diabetes Management (pt-stated)       Current Barriers:  . Chronic Disease Management support and education needs related to diabetes  Nurse Case Manager Clinical Goal(s):  Marland Kitchen Over the next 30 days, patient will work with the RN CCM to discuss diabetes management  Interventions:  . Reached out to PCP again regarding diabetes educator's recommendations . Awaiting PCP response . Will follow-up with patient once response has been received  Patient Self Care Activities:  . Performs ADL's independently . Performs IADL's independently   Please see past updates related to this goal by clicking on the "Past Updates" button in the selected goal       Chong Sicilian, BSN, RN-BC [place] / [place]: [phone]

## 2019-11-05 NOTE — Chronic Care Management (AMB) (Signed)
  Chronic Care Management   Follow Up Note   11/05/2019 Name: Shawne Kemerling MRN: [id number] DOB: 1951/06/23  Referred by: Dettinger, Fransisca Kaufmann, MD Reason for referral : Chronic Care Management (Care coordination)   Bismah Weintraub is a 69 y.o. year old female who is a primary care patient of Dettinger, Fransisca Kaufmann, MD. The CCM team was consulted for assistance with chronic disease management and care coordination needs.    Review of patient status, including review of consultants reports, relevant laboratory and other test results, and collaboration with appropriate care team members and the patient's provider was performed as part of comprehensive patient evaluation and provision of chronic care management services.    RN Care Plan   . Diabetes Management        Current Barriers:  . Chronic Disease Management support and education needs related to diabetes  Nurse Case Manager Clinical Goal(s):  Marland Kitchen Over the next 30 days, patient will work with the RN CCM to discuss diabetes management  Interventions:  . Reached out to PCP again regarding diabetes educator's recommendations . Awaiting PCP response . Will follow-up with patient once response has been received  Patient Self Care Activities:  . Performs ADL's independently . Performs IADL's independently   Please see past updates related to this goal by clicking on the "Past Updates" button in the selected goal          Plan:   The care management team will reach out to the patient again over the next 7 days.   Chong Sicilian, BSN, RN-BC Embedded Chronic Care Manager [facility] / [place] [facility] Direct Dial: [phone]

## 2019-11-09 ENCOUNTER — Ambulatory Visit: Payer: PPO | Admitting: *Deleted

## 2019-11-09 DIAGNOSIS — IMO0002 Reserved for concepts with insufficient information to code with codable children: Secondary | ICD-10-CM

## 2019-11-09 DIAGNOSIS — E1165 Type 2 diabetes mellitus with hyperglycemia: Secondary | ICD-10-CM

## 2019-11-09 NOTE — Patient Instructions (Signed)
Visit Information  Goals Addressed            This Visit's Progress     Patient Stated   . Diabetes Management (pt-stated)       Current Barriers:  . Chronic Disease Management support and education needs related to diabetes  Nurse Case Manager Clinical Goal(s):  Marland Kitchen Over the next 30 days, patient will work with the RN CCM to discuss diabetes management  Interventions:  . Chart reviewed . Collaborated with PCP . Reached out to patient to recommend decreasing Toujeo by 4 units a day . Left message on patient's voicemail requesting a return call  . Advised on VM to continue monitor blood sugar and reach out with any readings outside of recommended range  Patient Self Care Activities:  . Performs ADL's independently . Performs IADL's independently   Please see past updates related to this goal by clicking on the "Past Updates" button in the selected goal       Chong Sicilian, BSN, RN-BC [place] / [place]: [phone]

## 2019-11-09 NOTE — Chronic Care Management (AMB) (Signed)
Chronic Care Management   Follow Up Note   11/09/2019 Name: [name] MRN: [id number] DOB: Mar 03, 1951  Referred by: Dettinger, Fransisca Kaufmann, MD Reason for referral : Chronic Care Management (RN follow up)   [name] is a 69 y.o. year old female who is a primary care patient of Dettinger, Fransisca Kaufmann, MD. The CCM team was consulted for assistance with chronic disease management and care coordination needs.    Review of patient status, including review of consultants reports, relevant laboratory and other test results, and collaboration with appropriate care team members and the patient's provider was performed as part of comprehensive patient evaluation and provision of chronic care management services.     Outpatient Encounter Medications as of 11/09/2019  Medication Sig Note  . amLODipine (NORVASC) 5 MG tablet TAKE ONE (1) TABLET EACH DAY   . aspirin 325 MG tablet Take 325 mg by mouth daily.   Marland Kitchen atenolol (TENORMIN) 50 MG tablet Take 1 tablet (50 mg total) by mouth 2 (two) times daily.   . citalopram (CELEXA) 40 MG tablet TAKE ONE (1) TABLET EACH DAY   . clonazePAM (KLONOPIN) 0.5 MG tablet Take 1 tablet (0.5 mg total) by mouth at bedtime as needed for anxiety.   . Continuous Blood Gluc Sensor (FREESTYLE LIBRE 14 DAY SENSOR) MISC USE TO CHECK BLOOD GLUCOSE   . empagliflozin (JARDIANCE) 10 MG TABS tablet Take 10 mg by mouth daily.   . furosemide (LASIX) 40 MG tablet TAKE ONE (1) TABLET EACH DAY   . isosorbide mononitrate (IMDUR) 120 MG 24 hr tablet TAKE ONE (1) TABLET EACH DAY   . lisinopril (ZESTRIL) 40 MG tablet Take 1 tablet (40 mg total) by mouth daily.   . meclizine (ANTIVERT) 25 MG tablet Take 1 tablet (25 mg total) by mouth 3 (three) times daily as needed for dizziness.   . Misc. 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 nitroGLYCERIN (NITROSTAT) 0.4 MG SL tablet DISSOLVE 1 TAB UNDER TOUNGE FOR CHEST PAIN. MAY REPEAT EVERY 5 MINUTES  FOR 3 DOSES. IF NO RELIEF CALL 911 OR GO TO ER   . NOVOLOG FLEXPEN 100 UNIT/ML FlexPen INJECT 35-40 UNITS SQ 3 TIMES DAILY WITH MEALS   . potassium chloride SA (K-DUR) 20 MEQ tablet Take 2 tablets (40 mEq total) by mouth daily.   Marland Kitchen rOPINIRole (REQUIP) 0.5 MG tablet Take 1 tablet (0.5 mg total) by mouth at bedtime.   . rosuvastatin (CRESTOR) 40 MG tablet TAKE ONE (1) TABLET EACH DAY   . TOUJEO SOLOSTAR 300 UNIT/ML SOPN INJECT 50-60 UNITS SQ DAILY 10/27/2019: Pt reports taking 50 units a day  . vitamin B-12 (CYANOCOBALAMIN) 1000 MCG tablet Take 1,000 mcg by mouth daily.   . Vitamin D, Ergocalciferol, (DRISDOL) 1.25 MG (50000 UT) CAPS capsule TAKE 1 CAPSULE EVERY 7 DAYS    Facility-Administered Encounter Medications as of 11/09/2019  Medication  . cyanocobalamin ((VITAMIN B-12)) injection 1,000 mcg      RN Care Plan   . Diabetes Management        Current Barriers:  . Chronic Disease Management support and education needs related to diabetes  Nurse Case Manager Clinical Goal(s):  Marland Kitchen Over the next 30 days, patient will work with the RN CCM to discuss diabetes management  Interventions:  . Chart reviewed . Collaborated with PCP . Reached out to patient to recommend decreasing Toujeo by 4 units a day . Left message on patient's voicemail  requesting a return call  . Advised on VM to continue monitor blood sugar and reach out with any readings outside of recommended range  Patient Self Care Activities:  . Performs ADL's independently . Performs IADL's independently   Please see past updates related to this goal by clicking on the "Past Updates" button in the selected goal          Plan:   The care management team will reach out to the patient again over the next 7 days.    Chong Sicilian, BSN, RN-BC Embedded Chronic Care Manager [facility] / [place] Management Direct Dial: [phone]

## 2019-11-10 ENCOUNTER — Telehealth: Payer: Self-pay | Admitting: Family Medicine

## 2019-11-10 NOTE — Telephone Encounter (Signed)
Left message to call back  

## 2019-11-11 ENCOUNTER — Ambulatory Visit: Payer: PPO | Admitting: *Deleted

## 2019-11-11 DIAGNOSIS — Z794 Long term (current) use of insulin: Secondary | ICD-10-CM | POA: Diagnosis not present

## 2019-11-11 DIAGNOSIS — E1165 Type 2 diabetes mellitus with hyperglycemia: Secondary | ICD-10-CM

## 2019-11-11 DIAGNOSIS — IMO0002 Reserved for concepts with insufficient information to code with codable children: Secondary | ICD-10-CM

## 2019-11-11 NOTE — Chronic Care Management (AMB) (Signed)
Chronic Care Management   Follow Up Note   11/11/2019 Name: [name] MRN: [id number] DOB: 15-Sep-1951  Referred by: Dettinger, Fransisca Kaufmann, MD Reason for referral : Chronic Care Management (RN Follow up)   [name] is a 69 y.o. year old female who is a primary care patient of Dettinger, Fransisca Kaufmann, MD. The CCM team was consulted for assistance with chronic disease management and care coordination needs.    Review of patient status, including review of consultants reports, relevant laboratory and other test results, and collaboration with appropriate care team members and the patient's provider was performed as part of comprehensive patient evaluation and provision of chronic care management services.     Outpatient Encounter Medications as of 11/11/2019  Medication Sig Note  . amLODipine (NORVASC) 5 MG tablet TAKE ONE (1) TABLET EACH DAY   . aspirin 325 MG tablet Take 325 mg by mouth daily.   Marland Kitchen atenolol (TENORMIN) 50 MG tablet Take 1 tablet (50 mg total) by mouth 2 (two) times daily.   . citalopram (CELEXA) 40 MG tablet TAKE ONE (1) TABLET EACH DAY   . clonazePAM (KLONOPIN) 0.5 MG tablet Take 1 tablet (0.5 mg total) by mouth at bedtime as needed for anxiety.   . Continuous Blood Gluc Sensor (FREESTYLE LIBRE 14 DAY SENSOR) MISC USE TO CHECK BLOOD GLUCOSE   . empagliflozin (JARDIANCE) 10 MG TABS tablet Take 10 mg by mouth daily.   . furosemide (LASIX) 40 MG tablet TAKE ONE (1) TABLET EACH DAY   . isosorbide mononitrate (IMDUR) 120 MG 24 hr tablet TAKE ONE (1) TABLET EACH DAY   . lisinopril (ZESTRIL) 40 MG tablet Take 1 tablet (40 mg total) by mouth daily.   . meclizine (ANTIVERT) 25 MG tablet Take 1 tablet (25 mg total) by mouth 3 (three) times daily as needed for dizziness.   . Misc. 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 nitroGLYCERIN (NITROSTAT) 0.4 MG SL tablet DISSOLVE 1 TAB UNDER TOUNGE FOR CHEST PAIN. MAY REPEAT EVERY 5 MINUTES  FOR 3 DOSES. IF NO RELIEF CALL 911 OR GO TO ER   . NOVOLOG FLEXPEN 100 UNIT/ML FlexPen INJECT 35-40 UNITS SQ 3 TIMES DAILY WITH MEALS   . potassium chloride SA (K-DUR) 20 MEQ tablet Take 2 tablets (40 mEq total) by mouth daily.   Marland Kitchen rOPINIRole (REQUIP) 0.5 MG tablet Take 1 tablet (0.5 mg total) by mouth at bedtime.   . rosuvastatin (CRESTOR) 40 MG tablet TAKE ONE (1) TABLET EACH DAY   . TOUJEO SOLOSTAR 300 UNIT/ML SOPN INJECT 50-60 UNITS SQ DAILY 10/27/2019: Pt reports taking 50 units a day  . vitamin B-12 (CYANOCOBALAMIN) 1000 MCG tablet Take 1,000 mcg by mouth daily.   . Vitamin D, Ergocalciferol, (DRISDOL) 1.25 MG (50000 UT) CAPS capsule TAKE 1 CAPSULE EVERY 7 DAYS    Facility-Administered Encounter Medications as of 11/11/2019  Medication  . cyanocobalamin ((VITAMIN B-12)) injection 1,000 mcg     RN Care Plan   . Diabetes Management       Current Barriers:  . Chronic Disease Management support and education needs related to diabetes  Nurse Case Manager Clinical Goal(s):  Marland Kitchen Over the next 30 days, patient will work with the RN CCM to discuss diabetes management  Interventions:  . Chart reviewed . Collaborated with PCP . Talked with patient by telephone . Verified that patient has decreased daily dose of Toujeo by 4 units . Reviewed medications  and discussed Novolin. Verified that patient is taking 35-40 units TID with meals . Discussed blood sugar readings. No hypoglycemic events since last time we talked. o Blood sugar at 11:30 was 186. Had sausage biscuit and coffee around 7:00 am today . Reviewed upcoming appointments: Dr Warrick Parisian in March. Due for A1C then. . Advised patient to continue monitoring blood sugar with CGM and to reach out to PCP at [phone] with any readings outside of recommended range . RN will f/u with patient by telephone over the next 14 days to assess changes in blood sugar. Will collaborate with PCP afterwards.   Patient Self Care Activities:  . Performs  ADL's independently . Performs IADL's independently   Please see past updates related to this goal by clicking on the "Past Updates" button in the selected goal          Follow-up Plan:   The care management team will reach out to the patient again over the next 14 days.    Chong Sicilian, BSN, RN-BC Embedded Chronic Care Manager [facility] / [place] Management Direct Dial: [phone]

## 2019-11-11 NOTE — Patient Instructions (Signed)
Visit Information  Goals Addressed            This Visit's Progress     Patient Stated   . Diabetes Management (pt-stated)       Current Barriers:  . Chronic Disease Management support and education needs related to diabetes  Nurse Case Manager Clinical Goal(s):  Marland Kitchen Over the next 30 days, patient will work with the RN CCM to discuss diabetes management  Interventions:  . Chart reviewed . Collaborated with PCP . Talked with patient by telephone . Verified that patient has decreased daily dose of Toujeo by 4 units . Reviewed medications and discussed Novolin. Verified that patient is taking 35-40 units TID with meals . Discussed blood sugar readings. No hypoglycemic events since last time we talked. o Blood sugar at 11:30 was 186. Had sausage biscuit and coffee around 7:00 am today . Reviewed upcoming appointments: Dr Warrick Parisian in March. Due for A1C then. . Advised patient to continue monitoring blood sugar with CGM and to reach out to PCP at [phone] with any readings outside of recommended range . RN will f/u with patient by telephone over the next 14 days to assess changes in blood sugar. Will collaborate with PCP afterwards.   Patient Self Care Activities:  . Performs ADL's independently . Performs IADL's independently   Please see past updates related to this goal by clicking on the "Past Updates" button in the selected goal          The care management team will reach out to the patient again over the next 14 days.   Chong Sicilian, BSN, RN-BC Embedded Chronic Care Manager [facility] / [place] [facility] Direct Dial: [phone]   The patient verbalized understanding of instructions provided today and declined a print copy of patient instruction materials.

## 2019-11-19 ENCOUNTER — Telehealth: Payer: PPO

## 2019-11-20 NOTE — Telephone Encounter (Signed)
Patient has had covid injection.

## 2019-11-24 ENCOUNTER — Other Ambulatory Visit: Payer: Self-pay

## 2019-11-26 ENCOUNTER — Encounter: Payer: Self-pay | Admitting: Family Medicine

## 2019-11-26 ENCOUNTER — Other Ambulatory Visit: Payer: Self-pay

## 2019-11-26 ENCOUNTER — Ambulatory Visit (INDEPENDENT_AMBULATORY_CARE_PROVIDER_SITE_OTHER): Payer: PPO | Admitting: Family Medicine

## 2019-11-26 VITALS — BP 123/71 | HR 74 | Temp 99.1°F | Ht 66.0 in | Wt 175.0 lb

## 2019-11-26 DIAGNOSIS — Z794 Long term (current) use of insulin: Secondary | ICD-10-CM

## 2019-11-26 DIAGNOSIS — E1165 Type 2 diabetes mellitus with hyperglycemia: Secondary | ICD-10-CM | POA: Diagnosis not present

## 2019-11-26 DIAGNOSIS — IMO0002 Reserved for concepts with insufficient information to code with codable children: Secondary | ICD-10-CM

## 2019-11-26 DIAGNOSIS — E1169 Type 2 diabetes mellitus with other specified complication: Secondary | ICD-10-CM | POA: Diagnosis not present

## 2019-11-26 DIAGNOSIS — E782 Mixed hyperlipidemia: Secondary | ICD-10-CM | POA: Diagnosis not present

## 2019-11-26 DIAGNOSIS — E785 Hyperlipidemia, unspecified: Secondary | ICD-10-CM | POA: Diagnosis not present

## 2019-11-26 DIAGNOSIS — I10 Essential (primary) hypertension: Secondary | ICD-10-CM | POA: Diagnosis not present

## 2019-11-26 DIAGNOSIS — K219 Gastro-esophageal reflux disease without esophagitis: Secondary | ICD-10-CM | POA: Diagnosis not present

## 2019-11-26 LAB — BAYER DCA HB A1C WAIVED: HB A1C (BAYER DCA - WAIVED): 8.9 % — ABNORMAL HIGH (ref <7.0)

## 2019-11-26 MED ORDER — MECLIZINE HCL 25 MG PO TABS: 25.0000 mg | ORAL_TABLET | Freq: Three times a day (TID) | ORAL | 3 refills | Status: DC | PRN

## 2019-11-26 NOTE — Progress Notes (Signed)
BP 123/71   Pulse 74   Temp 99.1 F (37.3 C)   Ht 5\' 6"  (1.676 m)   Wt 175 lb (79.4 kg)   SpO2 97%   BMI 28.25 kg/m    Subjective:   Patient ID: [name], female    DOB: [birth year], 69 y.o.   MRN: [id number]  HPI: Myaisha Ledonne is a 69 y.o. female presenting on 11/26/2019 for Medical Management of Chronic Issues (70m follow up) and Diabetes   HPI Type 2 diabetes mellitus Patient comes in today for recheck of his diabetes. Patient has been currently taking NovoLog 30-40 depending on food and sliding scale when she is going to eat.  And Toujeo 50 and Jardiance and sugars are running better between 180 and 200 most the time she has.  Her last A1c was 9.4 and she will get it today.. Patient is currently on an ACE inhibitor/ARB. Patient has not seen an ophthalmologist this year. Patient denies any issues with their feet.   Hypertension Patient is currently on lisinopril and atenolol, and their blood pressure today is 123/71. Patient denies any lightheadedness or dizziness. Patient denies headaches, blurred vision, chest pains, shortness of breath, or weakness. Denies any side effects from medication and is content with current medication.   Hyperlipidemia Patient is coming in for recheck of his hyperlipidemia. The patient is currently taking Crestor. They deny any issues with myalgias or history of liver damage from it. They deny any focal numbness or weakness or chest pain.   Relevant past medical, surgical, family and social history reviewed and updated as indicated. Interim medical history since our last visit reviewed. Allergies and medications reviewed and updated.  Review of Systems  Constitutional: Negative for chills and fever.  Eyes: Negative for visual disturbance.  Respiratory: Negative for chest tightness and shortness of breath.   Cardiovascular: Negative for chest pain and leg swelling.  Musculoskeletal: Negative for back pain and gait problem.  Skin:  Negative for rash.  Neurological: Negative for light-headedness and headaches.  Psychiatric/Behavioral: Negative for agitation and behavioral problems.  All other systems reviewed and are negative.   Per HPI unless specifically indicated above   Allergies as of 11/26/2019      Reactions   Iohexol     Desc: pt had syncopal episode with nausea post IV CM late 1990's,  pt has had prednisone prep with heart caths x 2 without problem  kdean 04/16/07, Onset Date: [id number]   Ticlid [ticlopidine Hcl] Nausea And Vomiting   Codeine Nausea And Vomiting, Palpitations      Medication List       Accurate as of November 26, 2019  8:21 AM. If you have any questions, ask your nurse or doctor.        amLODipine 5 MG tablet Commonly known as: NORVASC TAKE ONE (1) TABLET EACH DAY   aspirin 325 MG tablet Take 325 mg by mouth daily.   atenolol 50 MG tablet Commonly known as: TENORMIN Take 1 tablet (50 mg total) by mouth 2 (two) times daily.   citalopram 40 MG tablet Commonly known as: CELEXA TAKE ONE (1) TABLET EACH DAY   clonazePAM 0.5 MG tablet Commonly known as: KLONOPIN Take 1 tablet (0.5 mg total) by mouth at bedtime as needed for anxiety.   empagliflozin 10 MG Tabs tablet Commonly known as: Jardiance Take 10 mg by mouth daily.   FreeStyle Libre 14 Day Sensor Misc USE TO CHECK BLOOD GLUCOSE   furosemide  40 MG tablet Commonly known as: LASIX TAKE ONE (1) TABLET EACH DAY   isosorbide mononitrate 120 MG 24 hr tablet Commonly known as: IMDUR TAKE ONE (1) TABLET EACH DAY   lisinopril 40 MG tablet Commonly known as: ZESTRIL Take 1 tablet (40 mg total) by mouth daily.   meclizine 25 MG tablet Commonly known as: ANTIVERT Take 1 tablet (25 mg total) by mouth 3 (three) times daily as needed for dizziness.   nitroGLYCERIN 0.4 MG SL tablet Commonly known as: Nitrostat DISSOLVE 1 TAB UNDER TOUNGE FOR CHEST PAIN. MAY REPEAT EVERY 5 MINUTES FOR 3 DOSES. IF NO RELIEF CALL 911 OR GO TO  ER   NovoLOG FlexPen 100 UNIT/ML FlexPen Generic drug: insulin aspart INJECT 35-40 UNITS SQ 3 TIMES DAILY WITH MEALS   potassium chloride SA 20 MEQ tablet Commonly known as: KLOR-CON Take 2 tablets (40 mEq total) by mouth daily.   rOPINIRole 0.5 MG tablet Commonly known as: Requip Take 1 tablet (0.5 mg total) by mouth at bedtime.   rosuvastatin 40 MG tablet Commonly known as: CRESTOR TAKE ONE (1) TABLET EACH DAY   Toujeo SoloStar 300 UNIT/ML Solostar Pen Generic drug: insulin glargine (1 Unit Dial) INJECT 50-60 UNITS SQ DAILY   vitamin B-12 1000 MCG tablet Commonly known as: CYANOCOBALAMIN Take 1,000 mcg by mouth daily.   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  Constitutional:      General: She is not in acute distress.    Appearance: She is well-developed. She is not diaphoretic.  Eyes:     Conjunctiva/sclera: Conjunctivae normal.  Cardiovascular:     Rate and Rhythm: Normal rate and regular rhythm.     Heart sounds: Normal heart sounds. No murmur.  Pulmonary:     Effort: Pulmonary effort is normal. No respiratory distress.     Breath sounds: Normal breath sounds. No wheezing.  Musculoskeletal:        General: No tenderness. Normal range of motion.  Skin:    General: Skin is warm and dry.     Findings: No rash.  Neurological:     Mental Status: She is alert and oriented to person, place, and time.     Coordination: Coordination normal.  Psychiatric:        Behavior: Behavior normal.        Assessment & Plan:   Problem List Items Addressed This Visit      Cardiovascular and Mediastinum   Essential hypertension     Digestive   GERD (gastroesophageal reflux disease)   Relevant Medications   meclizine (ANTIVERT) 25 MG tablet     Endocrine   Uncontrolled type 2 diabetes mellitus with insulin therapy ([place]) - Primary   Relevant Orders   Bayer DCA Hb A1c Waived   DM type 2 with diabetic dyslipidemia ([place])     Other   Hyperlipidemia  Continue current medication, make sure you bring libre with you the next time you come in for med check A1c today. Follow up plan: Return in about 3 months (around 02/26/2020), or if symptoms worsen or fail to improve, for Diabetes and hypertension recheck.  Counseling provided for all of the vaccine components Orders Placed This Encounter  Procedures  . [facility] Hb A1c [place], MD [facility] 11/26/2019, 8:21 AM

## 2019-11-30 ENCOUNTER — Other Ambulatory Visit: Payer: Self-pay

## 2019-12-01 ENCOUNTER — Ambulatory Visit (INDEPENDENT_AMBULATORY_CARE_PROVIDER_SITE_OTHER): Payer: PPO | Admitting: *Deleted

## 2019-12-01 ENCOUNTER — Telehealth: Payer: Self-pay | Admitting: Family Medicine

## 2019-12-01 DIAGNOSIS — E538 Deficiency of other specified B group vitamins: Secondary | ICD-10-CM | POA: Diagnosis not present

## 2019-12-01 NOTE — Telephone Encounter (Signed)
Spoke with pt and advised ok to have B12 injection after COVID vaccine. At this point no contraindications with B12 injections and COVID vaccine. Pt voiced understanding.

## 2019-12-01 NOTE — Patient Instructions (Signed)

## 2019-12-01 NOTE — Progress Notes (Signed)
Pt given B12 injection IM right deltoid and tolerated well. 

## 2019-12-21 ENCOUNTER — Telehealth: Payer: Self-pay | Admitting: Family Medicine

## 2019-12-21 MED ORDER — FREESTYLE LIBRE 14 DAY SENSOR MISC: 3 refills | Status: DC

## 2019-12-21 NOTE — Telephone Encounter (Signed)
Freestyle Libre sent to pharmacy.

## 2020-01-04 ENCOUNTER — Ambulatory Visit: Payer: PPO

## 2020-01-15 ENCOUNTER — Other Ambulatory Visit: Payer: Self-pay | Admitting: Family Medicine

## 2020-01-20 ENCOUNTER — Ambulatory Visit (INDEPENDENT_AMBULATORY_CARE_PROVIDER_SITE_OTHER): Payer: PPO | Admitting: *Deleted

## 2020-01-20 DIAGNOSIS — Z0000 Encounter for general adult medical examination without abnormal findings: Secondary | ICD-10-CM | POA: Diagnosis not present

## 2020-01-20 NOTE — Patient Instructions (Addendum)
[facility] Maintenance Summary and Written Plan of Care  [name] ,  Thank you for allowing me to perform your Medicare Annual Wellness Visit and for your ongoing commitment to your health.   Health Maintenance & Immunization History Health Maintenance  Topic Date Due  . COVID-19 Vaccine (1) Never done  . DEXA SCAN  12/14/2019  . INFLUENZA VACCINE  04/24/2020  . HEMOGLOBIN A1C  05/28/2020  . FOOT EXAM  09/27/2020  . OPHTHALMOLOGY EXAM  09/27/2020  . TETANUS/TDAP  11/21/2020  . MAMMOGRAM  06/25/2021  . COLONOSCOPY  01/09/2022  . Hepatitis C Screening  Completed  . PNA vac Low Risk Adult  Completed  . COLON CANCER SCREENING ANNUAL FOBT  Discontinued   Immunization History  Administered Date(s) Administered  . Fluad Quad(high Dose 65+) 06/17/2019  . Influenza, High Dose Seasonal PF 06/19/2017, 06/18/2018  . Influenza,inj,Quad PF,6+ Mos 06/28/2015  . Influenza-Unspecified 05/25/2014, 06/15/2016  . Pneumococcal Conjugate-13 02/08/2014  . Pneumococcal Polysaccharide-23 06/28/2015  . Tdap 11/22/2010    These are the patient goals that we discussed: Goals Addressed            This Visit's Progress   . awv       01/20/2020 AWV Goal: Fall Prevention  . Over the next year, patient will decrease their risk for falls by: o Using assistive devices, such as a cane or walker, as needed o Identifying fall risks within their home and correcting them by: - Removing throw rugs - Adding handrails to stairs or ramps - Removing clutter and keeping a clear pathway throughout the home - Increasing light, especially at night - Adding shower handles/bars - Raising toilet seat o Identifying potential personal risk factors for falls: - Medication side effects - Incontinence/urgency - Vestibular dysfunction - Hearing loss - Musculoskeletal disorders - Neurological disorders - Orthostatic hypotension  01/20/2020 AWV Goal: Diabetes  Management  . Patient will maintain an A1C level below 8.0 . Patient will not develop any diabetic foot complications . Patient will not experience any hypoglycemic episodes over the next 3 months . Patient will notify our office of any CBG readings outside of the provider recommended range by calling [phone] . Patient will adhere to provider recommendations for diabetes management  Patient Self Management Activities . take all medications as prescribed and report any negative side effects . monitor and record blood sugar readings as directed . adhere to a low carbohydrate diet that incorporates lean proteins, vegetables, whole grains, low glycemic fruits . check feet daily noting any sores, cracks, injuries, or callous formations . see PCP or podiatrist if she notices any changes in her legs, feet, or toenails . Patient will visit PCP and have an A1C level checked every 3 to 6 months as directed  . have a yearly eye exam to monitor for vascular changes associated with diabetes and will request that the report be sent to her pcp.  . consult with her PCP regarding any changes in her health or new or worsening symptoms         This is a list of Health Maintenance Items that are overdue or due now: Health Maintenance Due  Topic Date Due  . COVID-19 Vaccine (1) Never done  . DEXA SCAN  12/14/2019     Orders/Referrals Placed Today: No orders of the defined types were placed in this encounter.  (Contact our referral department at [phone] if you have not spoken with someone about your referral appointment within  the next 5 days)    Follow-up Plan  . Follow-up with Dettinger, Fransisca Kaufmann, MD as planned

## 2020-01-20 NOTE — Progress Notes (Signed)
MEDICARE ANNUAL WELLNESS VISIT  01/20/2020  Telephone Visit Disclaimer This Medicare AWV was conducted by telephone due to national recommendations for restrictions regarding the COVID-19 Pandemic (e.g. social distancing).  I verified, using two identifiers, that I am speaking with [name] or their authorized healthcare agent. I discussed the limitations, risks, security, and privacy concerns of performing an evaluation and management service by telephone and the potential availability of an in-person appointment in the future. The patient expressed understanding and agreed to proceed.   Subjective:  [name] is a 69 y.o. female patient of Dettinger, Fransisca Kaufmann, MD who had a Medicare Annual Wellness Visit today via telephone. Braelynne is Retired and Disabled and lives alone. She has 4 children. She reports that she is socially active and does interact with friends/family regularly. She is minimally physically active and enjoys enjoys going out to eat, going to church, gardening, and going to [employer].  Patient Care Team: Dettinger, Fransisca Kaufmann, MD as PCP - General (Family Medicine) Gala Romney Cristopher Estimable, MD (Gastroenterology) Minus Breeding, MD (Cardiology) Ilean China, RN as Registered Nurse  Advanced Directives 01/20/2020 01/27/2019 01/19/2019 12/18/2017 09/21/2017 03/08/2017 06/21/2016  Does Patient Have a Medical Advance Directive? No Yes Yes No No No No  Type of Advance Directive - [place];Living will [place];Living will - - - -  Does patient want to make changes to medical advance directive? - No - Patient declined - - - - -  Copy of [place] in Chart? - No - copy requested No - copy requested - - - -  Would patient like information on creating a medical advance directive? No - Patient declined - - Yes (ED - Information included in AVS) - - No - patient declined information  Pre-existing out of facility  DNR order (yellow form or pink MOST form) - - - - - - -    Hospital Utilization Over the Past 12 Months: # of hospitalizations or ER visits: 0 # of surgeries: 0  Review of Systems    Patient reports that her overall health is unchanged compared to last year.  History obtained from chart review and the patient Musculoskeletal ROS: positive for - pain in knee - bilateral  Patient Reported Readings (BP, Pulse, CBG, Weight, etc) CBG 178  Pain Assessment Pain : 0-10 Pain Score: 8  Pain Type: Chronic pain Pain Location: Knee Pain Orientation: Right, Left Pain Descriptors / Indicators: Aching, Sore Pain Onset: More than a month ago Pain Frequency: Constant Pain Relieving Factors: Nothing really helps Effect of Pain on Daily Activities: Cortisone shot in knee  Pain Relieving Factors: Nothing really helps  Current Medications & Allergies (verified) Allergies as of 01/20/2020      Reactions   Iohexol     Desc: pt had syncopal episode with nausea post IV CM late 1990's,  pt has had prednisone prep with heart caths x 2 without problem  kdean 04/16/07, Onset Date: [id number]   Ticlid [ticlopidine Hcl] Nausea And Vomiting   Codeine Nausea And Vomiting, Palpitations      Medication List       Accurate as of January 20, 2020  8:53 AM. If you have any questions, ask your nurse or doctor.        amLODipine 5 MG tablet Commonly known as: NORVASC TAKE ONE (1) TABLET EACH DAY   aspirin 325 MG tablet Take 325 mg by mouth daily.  atenolol 50 MG tablet Commonly known as: TENORMIN Take 1 tablet (50 mg total) by mouth 2 (two) times daily.   citalopram 40 MG tablet Commonly known as: CELEXA TAKE ONE (1) TABLET EACH DAY   clonazePAM 0.5 MG tablet Commonly known as: KLONOPIN Take 1 tablet (0.5 mg total) by mouth at bedtime as needed for anxiety.   empagliflozin 10 MG Tabs tablet Commonly known as: Jardiance Take 10 mg by mouth daily.   FreeStyle Libre 14 Day Sensor Misc USE TO  CHECK BLOOD GLUCOSE   furosemide 40 MG tablet Commonly known as: LASIX TAKE ONE (1) TABLET EACH DAY   isosorbide mononitrate 120 MG 24 hr tablet Commonly known as: IMDUR TAKE ONE (1) TABLET EACH DAY   lisinopril 40 MG tablet Commonly known as: ZESTRIL Take 1 tablet (40 mg total) by mouth daily.   meclizine 25 MG tablet Commonly known as: ANTIVERT Take 1 tablet (25 mg total) by mouth 3 (three) times daily as needed for dizziness.   nitroGLYCERIN 0.4 MG SL tablet Commonly known as: Nitrostat DISSOLVE 1 TAB UNDER TOUNGE FOR CHEST PAIN. MAY REPEAT EVERY 5 MINUTES FOR 3 DOSES. IF NO RELIEF CALL 911 OR GO TO ER   NovoLOG FlexPen 100 UNIT/ML FlexPen Generic drug: insulin aspart INJECT 35-40 UNITS SQ 3 TIMES DAILY WITH MEALS   potassium chloride SA 20 MEQ tablet Commonly known as: KLOR-CON Take 2 tablets (40 mEq total) by mouth daily.   rOPINIRole 0.5 MG tablet Commonly known as: Requip Take 1 tablet (0.5 mg total) by mouth at bedtime.   rosuvastatin 40 MG tablet Commonly known as: CRESTOR TAKE ONE (1) TABLET EACH DAY   Toujeo SoloStar 300 UNIT/ML Solostar Pen Generic drug: insulin glargine (1 Unit Dial) INJECT 50-60 UNITS SQ DAILY   vitamin B-12 1000 MCG tablet Commonly known as: CYANOCOBALAMIN Take 1,000 mcg by mouth daily.   Vitamin D (Ergocalciferol) 1.25 MG (50000 UNIT) Caps capsule Commonly known as: DRISDOL TAKE 1 CAPSULE EVERY 7 DAYS   Wall Grab Bar Misc For bathtub/shower dx: M17.0, 173.9, Z91.8       History (reviewed): Past Medical History:  Diagnosis Date  . Anxiety   . CAD (coronary artery disease)    DES to circumflex 02/2007, BMS to LAD and PTCA diagonal 03/2007  . Carotid artery plaque    Mild  . Cataract   . Depression   . Diverticulitis, colon   . Elevated d-dimer 01/08/2014  . Essential hypertension, benign   . GERD (gastroesophageal reflux disease)   . H/O hiatal hernia   . HLD (hyperlipidemia)   . IDDM (insulin dependent diabetes  mellitus)   . Migraine    "used to have them really bad; don't have them anymore" (01/07/2014)  . MS (multiple sclerosis) ([place])    Not confirmed  . PAT (paroxysmal atrial tachycardia) ([place])   . Prolapse of uterus   . PVD (peripheral vascular disease) ([place])   . TIA (transient ischemic attack) 1980's   Past Surgical History:  Procedure Laterality Date  . ABDOMINAL HYSTERECTOMY  1986   ovaries remain - prolaspe uterus   . APPENDECTOMY  ~ 1970  . BREAST BIOPSY Right 1980's  . BREAST LUMPECTOMY Right 1980's   Dr. Charlynne Pander   . CARDIAC CATHETERIZATION  01/07/2014  . CHOLECYSTECTOMY  ?1987  . COLONOSCOPY  2002   Dr. Anwar--> Severe diverticular changes in the region of the sigmoid and descending colon with scattered diverticular changes throughout the rest of the colon. No polyps,  ulcerations. Despite numerous manipulations, the tip of the scope could not be tipped into the cecal area.  . COLONOSCOPY  01/10/2012   Procedure: COLONOSCOPY;  Surgeon: Daneil Dolin, MD;  Location: [facility];  Service: Endoscopy;  Laterality: N/A;  1:55  . CORONARY ANGIOPLASTY WITH STENT PLACEMENT  ~ 1997 X 2   "2 + 1"  . EYE SURGERY Bilateral 2014   cataract  . INTRAVASCULAR PRESSURE WIRE/FFR STUDY N/A 03/08/2017   Procedure: Intravascular Pressure Wire/FFR Study;  Surgeon: Nelva Bush, MD;  Location: [facility];  Service: Cardiovascular;  Laterality: N/A;  . LEFT HEART CATH AND CORONARY ANGIOGRAPHY N/A 03/08/2017   Procedure: Left Heart Cath and Coronary Angiography;  Surgeon: Nelva Bush, MD;  Location: [facility];  Service: Cardiovascular;  Laterality: N/A;  . LEFT HEART CATHETERIZATION WITH CORONARY ANGIOGRAM N/A 01/07/2014   Procedure: LEFT HEART CATHETERIZATION WITH CORONARY ANGIOGRAM;  Surgeon: Larey Dresser, MD;  [facility];  Service: Cardiovascular;  Laterality: N/A;   Family History  Problem Relation Age of Onset  . Heart attack Mother 16  . Diabetes Mother     . Hypertension Mother   . Heart attack Father 34  . Heart attack Brother 32       x 6  . Heart disease Brother   . Diabetes Brother   . Colon cancer Paternal Aunt        60s, died with brain anuerysm  . Crohn's disease Cousin        paternal  . Diabetes Sister   . GER disease Daughter   . Cervical cancer Daughter   . Diabetes Daughter    Social History   Socioeconomic History  . Marital status: Widowed    Spouse name: Not on file  . Number of children: 4  . Years of education: 31  . Highest education level: 11th grade  Occupational History  . Occupation: Disability    Employer: DISABLED  Tobacco Use  . Smoking status: Never Smoker  . Smokeless tobacco: Never Used  . Tobacco comment: spouse, 50 years - husband has quit 01/2011  Substance and Sexual Activity  . Alcohol use: No  . Drug use: No  . Sexual activity: Not Currently  Other Topics Concern  . Not on file  Social History Narrative  . Not on file   Social Determinants of Health   Financial Resource Strain:   . Difficulty of Paying Living Expenses:   Food Insecurity:   . Worried About Charity fundraiser in the Last Year:   . Arboriculturist in the Last Year:   Transportation Needs:   . Film/video editor (Medical):   Marland Kitchen Lack of Transportation (Non-Medical):   Physical Activity:   . Days of Exercise per Week:   . Minutes of Exercise per Session:   Stress:   . Feeling of Stress :   Social Connections:   . Frequency of Communication with Friends and Family:   . Frequency of Social Gatherings with Friends and Family:   . Attends Religious Services:   . Active Member of Clubs or Organizations:   . Attends Archivist Meetings:   Marland Kitchen Marital Status:     Activities of Daily Living In your present state of health, do you have any difficulty performing the following activities: 01/20/2020  Hearing? N  Vision? Y  Comment wears reading glasses.  Difficulty concentrating or making decisions? N   Walking or climbing  stairs? Y  Comment due to knee pain  Dressing or bathing? N  Doing errands, shopping? Y  Comment Due to fluid retention and knee pain  Preparing Food and eating ? N  Using the Toilet? N  In the past six months, have you accidently leaked urine? N  Do you have problems with loss of bowel control? N  Managing your Medications? N  Managing your Finances? N  Housekeeping or managing your Housekeeping? N  Some recent data might be hidden    Patient Education/ Literacy How often do you need to have someone help you when you read instructions, pamphlets, or other written materials from your doctor or pharmacy?: 1 - Never  Exercise Current Exercise Habits: Home exercise routine, Time (Minutes): 10, Frequency (Times/Week): 7, Weekly Exercise (Minutes/Week): 70, Intensity: Mild  Diet Patient reports consuming 2 meals a day and 1 snack(s) a day Patient reports that her primary diet is: Regular, Diabetic Patient reports that she does have regular access to food.   Depression Screen PHQ 2/9 Scores 01/20/2020 11/26/2019 06/17/2019 05/12/2019 01/27/2019 01/19/2019 12/04/2018  PHQ - 2 Score 0 0 0 0 6 0 0  PHQ- 9 Score - - - - 14 - -     Fall Risk Fall Risk  01/20/2020 11/26/2019 06/17/2019 05/12/2019 01/19/2019  Falls in the past year? 1 0 0 0 1  Number falls in past yr: 0 - - - 1  Injury with Fall? 0 - - - 1  Comment - - - - -  Risk Factor Category  - - - - -  Risk for fall due to : Impaired balance/gait - - - History of fall(s);Impaired mobility  Risk for fall due to: Comment - - - - -  Follow up Falls evaluation completed - - - Falls evaluation completed;Falls prevention discussed     Objective:  Tanikia Trindle seemed alert and oriented and she participated appropriately during our telephone visit.  Blood Pressure Weight BMI  BP Readings from Last 3 Encounters:  11/26/19 123/71  06/17/19 140/77  05/12/19 130/67   Wt Readings from Last 3 Encounters:  11/26/19 175  lb (79.4 kg)  06/17/19 184 lb 6.4 oz (83.6 kg)  05/12/19 184 lb 3.2 oz (83.6 kg)   BMI Readings from Last 1 Encounters:  11/26/19 28.25 kg/m    *Unable to obtain current vital signs, weight, and BMI due to telephone visit type  Hearing/Vision  . Rehab did not seem to have difficulty with hearing/understanding during the telephone conversation . Reports that she has had a formal eye exam by an eye care professional within the past year . Reports that she has not had a formal hearing evaluation within the past year *Unable to fully assess hearing and vision during telephone visit type  Cognitive Function: 6CIT Screen 01/20/2020 01/19/2019  What Year? 0 points 0 points  What month? 0 points 0 points  What time? 0 points 0 points  Count back from 20 0 points 0 points  Months in reverse 0 points 0 points  Repeat phrase 0 points 2 points  Total Score 0 2   (Normal:0-7, Significant for Dysfunction: >8)  Normal Cognitive Function Screening: Yes   Immunization & Health Maintenance Record Immunization History  Administered Date(s) Administered  . Fluad Quad(high Dose 65+) 06/17/2019  . Influenza, High Dose Seasonal PF 06/19/2017, 06/18/2018  . Influenza,inj,Quad PF,6+ Mos 06/28/2015  . Influenza-Unspecified 05/25/2014, 06/15/2016  . Pneumococcal Conjugate-13 02/08/2014  . Pneumococcal Polysaccharide-23 06/28/2015  .  Tdap 11/22/2010    Health Maintenance  Topic Date Due  . COVID-19 Vaccine (1) Never done  . DEXA SCAN  12/14/2019  . INFLUENZA VACCINE  04/24/2020  . HEMOGLOBIN A1C  05/28/2020  . FOOT EXAM  09/27/2020  . OPHTHALMOLOGY EXAM  09/27/2020  . TETANUS/TDAP  11/21/2020  . MAMMOGRAM  06/25/2021  . COLONOSCOPY  01/09/2022  . Hepatitis C Screening  Completed  . PNA vac Low Risk Adult  Completed  . COLON CANCER SCREENING ANNUAL FOBT  Discontinued       Assessment  This is a routine wellness examination for [name].  Health Maintenance: Due or  Overdue Health Maintenance Due  Topic Date Due  . COVID-19 Vaccine (1) Never done  . DEXA SCAN  12/14/2019    [name] does not need a referral for Community Assistance: Care Management:   not applicable Social Work:    not applicable Prescription Assistance:  not applicable Nutrition/Diabetes Education:  not applicable   Plan:  Personalized Goals Goals Addressed            This Visit's Progress   . awv       01/20/2020 AWV Goal: Fall Prevention  . Over the next year, patient will decrease their risk for falls by: o Using assistive devices, such as a cane or walker, as needed o Identifying fall risks within their home and correcting them by: - Removing throw rugs - Adding handrails to stairs or ramps - Removing clutter and keeping a clear pathway throughout the home - Increasing light, especially at night - Adding shower handles/bars - Raising toilet seat o Identifying potential personal risk factors for falls: - Medication side effects - Incontinence/urgency - Vestibular dysfunction - Hearing loss - Musculoskeletal disorders - Neurological disorders - Orthostatic hypotension  01/20/2020 AWV Goal: Diabetes Management  . Patient will maintain an A1C level below 8.0 . Patient will not develop any diabetic foot complications . Patient will not experience any hypoglycemic episodes over the next 3 months . Patient will notify our office of any CBG readings outside of the provider recommended range by calling [phone] . Patient will adhere to provider recommendations for diabetes management  Patient Self Management Activities . take all medications as prescribed and report any negative side effects . monitor and record blood sugar readings as directed . adhere to a low carbohydrate diet that incorporates lean proteins, vegetables, whole grains, low glycemic fruits . check feet daily noting any sores, cracks, injuries, or callous formations . see PCP or  podiatrist if she notices any changes in her legs, feet, or toenails . Patient will visit PCP and have an A1C level checked every 3 to 6 months as directed  . have a yearly eye exam to monitor for vascular changes associated with diabetes and will request that the report be sent to her pcp.  . consult with her PCP regarding any changes in her health or new or worsening symptoms       Personalized Health Maintenance & Screening Recommendations  Bone densitometry screening  Lung Cancer Screening Recommended: no (Low Dose CT Chest recommended if Age 73-80 years, 30 pack-year currently smoking OR have quit w/in past 15 years) Hepatitis C Screening recommended: not applicable HIV Screening recommended: not applicable  Advanced Directives: Written information was not prepared per patient's request.  Referrals & Orders No orders of the defined types were placed in this encounter.   Follow-up Plan . Follow-up with Dettinger, Fransisca Kaufmann,  MD as planned . We will complete Dexa scan at next appointment  . AVS printed and mailed to patient    I have personally reviewed and noted the following in the patient's chart:   . Medical and social history . Use of alcohol, tobacco or illicit drugs  . Current medications and supplements . Functional ability and status . Nutritional status . Physical activity . Advanced directives . List of other physicians . Hospitalizations, surgeries, and ER visits in previous 12 months . Vitals . Screenings to include cognitive, depression, and falls . Referrals and appointments  In addition, I have reviewed and discussed with [name] certain preventive protocols, quality metrics, and best practice recommendations. A written personalized care plan for preventive services as well as general preventive health recommendations is available and can be mailed to the patient at her request.      Lynnea Ferrier, LPN  [phone]

## 2020-03-02 ENCOUNTER — Ambulatory Visit (INDEPENDENT_AMBULATORY_CARE_PROVIDER_SITE_OTHER): Payer: PPO | Admitting: Family Medicine

## 2020-03-02 ENCOUNTER — Other Ambulatory Visit: Payer: Self-pay

## 2020-03-02 ENCOUNTER — Encounter: Payer: Self-pay | Admitting: Pharmacist

## 2020-03-02 ENCOUNTER — Encounter: Payer: Self-pay | Admitting: Family Medicine

## 2020-03-02 ENCOUNTER — Other Ambulatory Visit: Payer: Self-pay | Admitting: *Deleted

## 2020-03-02 ENCOUNTER — Telehealth: Payer: Self-pay | Admitting: Family Medicine

## 2020-03-02 VITALS — BP 139/87 | HR 91 | Temp 99.3°F | Ht 66.0 in | Wt 175.2 lb

## 2020-03-02 DIAGNOSIS — Z794 Long term (current) use of insulin: Secondary | ICD-10-CM | POA: Diagnosis not present

## 2020-03-02 DIAGNOSIS — IMO0002 Reserved for concepts with insufficient information to code with codable children: Secondary | ICD-10-CM

## 2020-03-02 DIAGNOSIS — E538 Deficiency of other specified B group vitamins: Secondary | ICD-10-CM

## 2020-03-02 DIAGNOSIS — I10 Essential (primary) hypertension: Secondary | ICD-10-CM

## 2020-03-02 DIAGNOSIS — E1169 Type 2 diabetes mellitus with other specified complication: Secondary | ICD-10-CM

## 2020-03-02 DIAGNOSIS — E782 Mixed hyperlipidemia: Secondary | ICD-10-CM

## 2020-03-02 DIAGNOSIS — E785 Hyperlipidemia, unspecified: Secondary | ICD-10-CM

## 2020-03-02 DIAGNOSIS — M1712 Unilateral primary osteoarthritis, left knee: Secondary | ICD-10-CM

## 2020-03-02 DIAGNOSIS — K219 Gastro-esophageal reflux disease without esophagitis: Secondary | ICD-10-CM | POA: Diagnosis not present

## 2020-03-02 DIAGNOSIS — E1165 Type 2 diabetes mellitus with hyperglycemia: Secondary | ICD-10-CM | POA: Diagnosis not present

## 2020-03-02 LAB — BAYER DCA HB A1C WAIVED: HB A1C (BAYER DCA - WAIVED): 8.6 % — ABNORMAL HIGH (ref <7.0)

## 2020-03-02 MED ORDER — OZEMPIC (0.25 OR 0.5 MG/DOSE) 2 MG/1.5ML ~~LOC~~ SOPN: 0.5000 mg | PEN_INJECTOR | SUBCUTANEOUS | 11 refills | Status: DC

## 2020-03-02 MED ORDER — METHYLPREDNISOLONE ACETATE 80 MG/ML IJ SUSP: 80.0000 mg | Freq: Once | INTRAMUSCULAR | Status: DC

## 2020-03-02 NOTE — Telephone Encounter (Signed)
FYI only New script sent with quantity of 1.5 ml which insurance prefers. Pharmacist has been converted but hopes provider's will use the ml instead of pen in quantity .

## 2020-03-02 NOTE — Progress Notes (Signed)
-  Started GLP-1 Ozempic (generic name: semglutide)  . Instructions: Inject 0.25 mg into the skin weekly for 4 weeks, then increase to 0.5mg  weekly thereafter (as tolerated) . Demonstration and teaching provided . RX sent to health dept on 03/02/20 . Decrease basal to 45 units . F/u w/ PharmD in 2 weeks

## 2020-03-02 NOTE — Telephone Encounter (Signed)
Okay; thanks.

## 2020-03-02 NOTE — Progress Notes (Signed)
BP 139/87   Pulse 91   Temp 99.3 F (37.4 C) (Temporal)   Ht 5' 6"  (1.676 m)   Wt 175 lb 4 oz (79.5 kg)   BMI 28.29 kg/m    Subjective:   Patient ID: [name], female    DOB: 05-18-1951, 69 y.o.   MRN: [id number]  HPI: [name] is a 69 y.o. female presenting on 03/02/2020 for Medical Management of Chronic Issues   HPI Patient is coming in complaining of left knee pain, she has history of x-rays which shows arthritis significant in both knees but the left one hurts more right now on the medial aspect and the other 1, there is grinding that she feels in her knees.  She would like an injection today  Type 2 diabetes mellitus Patient comes in today for recheck of his diabetes. Patient has been currently taking NovoLog 36 3 times daily with meals and Toujeo 50 units nightly and Jardiance. Patient is currently on an ACE inhibitor/ARB. Patient has not seen an ophthalmologist this year. Patient denies any issues with their feet. The symptom started onset as an adult hyperlipidemia and CAD and hypertension ARE RELATED TO DM   Hypertension Patient is currently on amlodipine and atenolol and isosorbide mononitrate, and their blood pressure today is 139/87. Patient denies any lightheadedness or dizziness. Patient denies headaches, blurred vision, chest pains, shortness of breath, or weakness. Denies any side effects from medication and is content with current medication.   Hyperlipidemia Patient is coming in for recheck of his hyperlipidemia. The patient is currently taking Crestor. They deny any issues with myalgias or history of liver damage from it. They deny any focal numbness or weakness or chest pain.   Relevant past medical, surgical, family and social history reviewed and updated as indicated. Interim medical history since our last visit reviewed. Allergies and medications reviewed and updated.  Review of Systems  Constitutional: Negative for chills and fever.   Eyes: Negative for visual disturbance.  Respiratory: Negative for chest tightness and shortness of breath.   Cardiovascular: Negative for chest pain and leg swelling.  Musculoskeletal: Positive for arthralgias. Negative for back pain and gait problem.  Skin: Negative for rash.  Neurological: Negative for light-headedness and headaches.  Psychiatric/Behavioral: Negative for agitation and behavioral problems.  All other systems reviewed and are negative.   Per HPI unless specifically indicated above   Allergies as of 03/02/2020      Reactions   Iohexol     Desc: pt had syncopal episode with nausea post IV CM late 1990's,  pt has had prednisone prep with heart caths x 2 without problem  kdean 04/16/07, Onset Date: [id number]   Ticlid [ticlopidine Hcl] Nausea And Vomiting   Codeine Nausea And Vomiting, Palpitations      Medication List       Accurate as of March 02, 2020  9:25 AM. If you have any questions, ask your nurse or doctor.        amLODipine 5 MG tablet Commonly known as: NORVASC TAKE ONE (1) TABLET EACH DAY   aspirin 325 MG tablet Take 325 mg by mouth daily.   atenolol 50 MG tablet Commonly known as: TENORMIN Take 1 tablet (50 mg total) by mouth 2 (two) times daily.   citalopram 40 MG tablet Commonly known as: CELEXA TAKE ONE (1) TABLET EACH DAY   clonazePAM 0.5 MG tablet Commonly known as: KLONOPIN Take 1 tablet (0.5 mg total) by mouth at  bedtime as needed for anxiety.   empagliflozin 10 MG Tabs tablet Commonly known as: Jardiance Take 10 mg by mouth daily.   FreeStyle Libre 14 Day Sensor Misc USE TO CHECK BLOOD GLUCOSE   furosemide 40 MG tablet Commonly known as: LASIX TAKE ONE (1) TABLET EACH DAY   isosorbide mononitrate 120 MG 24 hr tablet Commonly known as: IMDUR TAKE ONE (1) TABLET EACH DAY   lisinopril 40 MG tablet Commonly known as: ZESTRIL Take 1 tablet (40 mg total) by mouth daily.   meclizine 25 MG tablet Commonly known as: ANTIVERT Take  1 tablet (25 mg total) by mouth 3 (three) times daily as needed for dizziness.   nitroGLYCERIN 0.4 MG SL tablet Commonly known as: Nitrostat DISSOLVE 1 TAB UNDER TOUNGE FOR CHEST PAIN. MAY REPEAT EVERY 5 MINUTES FOR 3 DOSES. IF NO RELIEF CALL 911 OR GO TO ER   NovoLOG FlexPen 100 UNIT/ML FlexPen Generic drug: insulin aspart INJECT 35-40 UNITS SQ 3 TIMES DAILY WITH MEALS   Ozempic (0.25 or 0.5 MG/DOSE) 2 MG/1.5ML Sopn Generic drug: Semaglutide(0.25 or 0.5MG/DOS) Inject 0.375 mLs (0.5 mg total) into the skin once a week. Started by: Fransisca Kaufmann Karen Kolarik, MD   potassium chloride SA 20 MEQ tablet Commonly known as: KLOR-CON Take 2 tablets (40 mEq total) by mouth daily.   rOPINIRole 0.5 MG tablet Commonly known as: Requip Take 1 tablet (0.5 mg total) by mouth at bedtime.   rosuvastatin 40 MG tablet Commonly known as: CRESTOR TAKE ONE (1) TABLET EACH DAY   Toujeo SoloStar 300 UNIT/ML Solostar Pen Generic drug: insulin glargine (1 Unit Dial) INJECT 50-60 UNITS SQ DAILY   vitamin B-12 1000 MCG tablet Commonly known as: CYANOCOBALAMIN Take 1,000 mcg by mouth daily.   Vitamin D (Ergocalciferol) 1.25 MG (50000 UNIT) Caps capsule Commonly known as: DRISDOL TAKE 1 CAPSULE EVERY 7 DAYS   Wall Grab Bar Misc For bathtub/shower dx: M17.0, 173.9, Z91.8        Objective:   BP 139/87   Pulse 91   Temp 99.3 F (37.4 C) (Temporal)   Ht 5' 6"  (1.676 m)   Wt 175 lb 4 oz (79.5 kg)   BMI 28.29 kg/m   Wt Readings from Last 3 Encounters:  03/02/20 175 lb 4 oz (79.5 kg)  11/26/19 175 lb (79.4 kg)  06/17/19 184 lb 6.4 oz (83.6 kg)    Physical Exam Vitals and nursing note reviewed.  Constitutional:      General: She is not in acute distress.    Appearance: She is well-developed. She is not diaphoretic.  Eyes:     Conjunctiva/sclera: Conjunctivae normal.  Cardiovascular:     Rate and Rhythm: Normal rate and regular rhythm.     Heart sounds: Normal heart sounds. No murmur.   Pulmonary:     Effort: Pulmonary effort is normal. No respiratory distress.     Breath sounds: Normal breath sounds. No wheezing.  Musculoskeletal:     Left knee: Crepitus present. Normal range of motion. Tenderness present over the medial joint line. No LCL laxity, MCL laxity, ACL laxity or PCL laxity. Skin:    General: Skin is warm and dry.     Findings: No rash.  Neurological:     Mental Status: She is alert and oriented to person, place, and time.     Coordination: Coordination normal.  Psychiatric:        Behavior: Behavior normal.     A1c is 8.6 today  Knee injection: Consent form  signed. Risk factors of bleeding and infection discussed with patient and patient is agreeable towards injection. Patient prepped with Betadine. Lateral approach towards injection used. Injected 80 mg of Depo-Medrol and 1 mL of 2% lidocaine. Patient tolerated procedure well and no side effects from noted. Minimal to no bleeding. Simple bandage applied after.   Assessment & Plan:   Problem List Items Addressed This Visit      Cardiovascular and Mediastinum   Essential hypertension     Digestive   GERD (gastroesophageal reflux disease)     Endocrine   Uncontrolled type 2 diabetes mellitus with insulin therapy ([place]) - Primary   Relevant Medications   Semaglutide,0.25 or 0.5MG/DOS, (OZEMPIC, 0.25 OR 0.5 MG/DOSE,) 2 MG/1.5ML SOPN   Other Relevant Orders   Microalbumin / creatinine urine ratio   Bayer DCA Hb A1c Waived   CBC with Differential/Platelet   CMP14+EGFR   DM type 2 with diabetic dyslipidemia (HCC)   Relevant Medications   Semaglutide,0.25 or 0.5MG/DOS, (OZEMPIC, 0.25 OR 0.5 MG/DOSE,) 2 MG/1.5ML SOPN     Other   Hyperlipidemia   Relevant Orders   Lipid panel   B12 deficiency   Relevant Orders   Vitamin B12    Other Visit Diagnoses    Primary osteoarthritis of left knee       Relevant Medications   methylPREDNISolone acetate (DEPO-MEDROL) injection 80 mg (Start on 03/02/2020   9:30 AM)      We will add Ozempic, also gave sample for Ozempic, she is going to try through prescription assistance for Ozempic. Follow up plan: Return in about 3 months (around 06/02/2020), or if symptoms worsen or fail to improve, for Diabetes recheck, also needs appointment with Lottie Dawson.  Counseling provided for all of the vaccine components Orders Placed This Encounter  Procedures  . Microalbumin / creatinine urine ratio  . Bayer DCA Hb A1c Waived  . CBC with Differential/Platelet  . CMP14+EGFR  . Lipid panel  . Vitamin B12    Caryl Pina, MD [facility] 03/02/2020, 9:25 AM

## 2020-03-03 LAB — CBC WITH DIFFERENTIAL/PLATELET
Basophils Absolute: 0 10*3/uL (ref 0.0–0.2)
Basos: 1 %
EOS (ABSOLUTE): 0.2 10*3/uL (ref 0.0–0.4)
Eos: 2 %
Hematocrit: 39.5 % (ref 34.0–46.6)
Hemoglobin: 12.7 g/dL (ref 11.1–15.9)
Immature Grans (Abs): 0 10*3/uL (ref 0.0–0.1)
Immature Granulocytes: 0 %
Lymphocytes Absolute: 2.6 10*3/uL (ref 0.7–3.1)
Lymphs: 32 %
MCH: 29.5 pg (ref 26.6–33.0)
MCHC: 32.2 g/dL (ref 31.5–35.7)
MCV: 92 fL (ref 79–97)
Monocytes Absolute: 0.5 10*3/uL (ref 0.1–0.9)
Monocytes: 7 %
Neutrophils Absolute: 4.8 10*3/uL (ref 1.4–7.0)
Neutrophils: 58 %
Platelets: 165 10*3/uL (ref 150–450)
RBC: 4.31 x10E6/uL (ref 3.77–5.28)
RDW: 13.7 % (ref 11.7–15.4)
WBC: 8.2 10*3/uL (ref 3.4–10.8)

## 2020-03-03 LAB — LIPID PANEL
Chol/HDL Ratio: 3.8 ratio (ref 0.0–4.4)
Cholesterol, Total: 128 mg/dL (ref 100–199)
HDL: 34 mg/dL — ABNORMAL LOW (ref >39)
LDL Chol Calc (NIH): 68 mg/dL (ref 0–99)
Triglycerides: 146 mg/dL (ref 0–149)
VLDL Cholesterol Cal: 26 mg/dL (ref 5–40)

## 2020-03-03 LAB — CMP14+EGFR
ALT: 10 IU/L (ref 0–32)
AST: 13 IU/L (ref 0–40)
Albumin/Globulin Ratio: 1.2 (ref 1.2–2.2)
Albumin: 3.8 g/dL (ref 3.7–4.7)
Alkaline Phosphatase: 67 IU/L (ref 48–121)
BUN/Creatinine Ratio: 9 — ABNORMAL LOW (ref 12–28)
BUN: 9 mg/dL (ref 8–27)
Bilirubin Total: 0.4 mg/dL (ref 0.0–1.2)
CO2: 25 mmol/L (ref 20–29)
Calcium: 9.4 mg/dL (ref 8.7–10.3)
Chloride: 102 mmol/L (ref 96–106)
Creatinine, Ser: 0.99 mg/dL (ref 0.57–1.00)
GFR calc Af Amer: 66 mL/min/{1.73_m2} (ref >59)
GFR calc non Af Amer: 58 mL/min/{1.73_m2} — ABNORMAL LOW (ref >59)
Globulin, Total: 3.1 g/dL (ref 1.5–4.5)
Glucose: 204 mg/dL — ABNORMAL HIGH (ref 65–99)
Potassium: 3.2 mmol/L — ABNORMAL LOW (ref 3.5–5.2)
Sodium: 143 mmol/L (ref 134–144)
Total Protein: 6.9 g/dL (ref 6.0–8.5)

## 2020-03-03 LAB — VITAMIN B12: Vitamin B-12: 353 pg/mL (ref 232–1245)

## 2020-03-03 LAB — MICROALBUMIN / CREATININE URINE RATIO
Creatinine, Urine: 44.3 mg/dL
Microalb/Creat Ratio: 64 mg/g creat — ABNORMAL HIGH (ref 0–29)
Microalbumin, Urine: 28.5 ug/mL

## 2020-03-04 ENCOUNTER — Telehealth: Payer: Self-pay | Admitting: *Deleted

## 2020-03-04 NOTE — Telephone Encounter (Signed)
Pt wants lab results from 03/02/20. Haven't been reviewed

## 2020-03-14 ENCOUNTER — Ambulatory Visit: Payer: PPO | Admitting: Pharmacist

## 2020-03-16 ENCOUNTER — Telehealth: Payer: Self-pay | Admitting: Family Medicine

## 2020-03-16 NOTE — Telephone Encounter (Signed)
Patient states she was prescribed 2 different for acid reflux in the past and is needing something again. She has tried otc meds not helping. She has been prescribed Nexium and Pepcid before. Can one of these be recalled in or does she need an appointment ?

## 2020-03-16 NOTE — Telephone Encounter (Signed)
She should make an appt. To be evaluated for this

## 2020-03-16 NOTE — Telephone Encounter (Signed)
Pt states she needs something for acid reflux sent in to the pharmacy. She states that she is having awful reflux and has tried OTC and nothing is helping. She uses [facility].

## 2020-03-17 ENCOUNTER — Ambulatory Visit (INDEPENDENT_AMBULATORY_CARE_PROVIDER_SITE_OTHER): Payer: PPO | Admitting: Family Medicine

## 2020-03-17 ENCOUNTER — Encounter: Payer: Self-pay | Admitting: Family Medicine

## 2020-03-17 DIAGNOSIS — K219 Gastro-esophageal reflux disease without esophagitis: Secondary | ICD-10-CM

## 2020-03-17 MED ORDER — ESOMEPRAZOLE MAGNESIUM 40 MG PO CPDR: 40.0000 mg | DELAYED_RELEASE_CAPSULE | Freq: Every day | ORAL | 3 refills | Status: DC

## 2020-03-17 NOTE — Progress Notes (Signed)
Virtual Visit via Telephone Note  I connected with [name] on 03/17/20 at 9:41 AM by telephone and verified that I am speaking with the correct person using two identifiers. [name] is currently located at home and nobody is currently with her during this visit. The provider, Loman Brooklyn, FNP is located in their office at time of visit.  I discussed the limitations, risks, security and privacy concerns of performing an evaluation and management service by telephone and the availability of in person appointments. I also discussed with the patient that there may be a patient responsible charge related to this service. The patient expressed understanding and agreed to proceed.  Subjective: PCP: Dettinger, Fransisca Kaufmann, MD  Chief Complaint  Patient presents with  . Gastroesophageal Reflux   GERD: Paitent complains of heartburn. This has been associated with belching and gas.  She denies chest pain, choking on food, cough, difficulty swallowing and dysphagia. Symptoms have been present for a few weeks. She denies dysphagia.  She has not lost weight. She denies melena, hematochezia, hematemesis, and coffee ground emesis. Medical therapy in the past has included antacids - not helpful.   ROS: Per HPI  Current Outpatient Medications:  .  amLODipine (NORVASC) 5 MG tablet, TAKE ONE (1) TABLET EACH DAY, Disp: 90 tablet, Rfl: 0 .  aspirin 325 MG tablet, Take 325 mg by mouth daily., Disp: , Rfl:  .  atenolol (TENORMIN) 50 MG tablet, Take 1 tablet (50 mg total) by mouth 2 (two) times daily., Disp: 180 tablet, Rfl: 3 .  citalopram (CELEXA) 40 MG tablet, TAKE ONE (1) TABLET EACH DAY, Disp: 90 tablet, Rfl: 3 .  clonazePAM (KLONOPIN) 0.5 MG tablet, Take 1 tablet (0.5 mg total) by mouth at bedtime as needed for anxiety., Disp: 30 tablet, Rfl: 2 .  Continuous Blood Gluc Sensor (FREESTYLE LIBRE 14 DAY SENSOR) MISC, USE TO CHECK BLOOD GLUCOSE, Disp: 8 each, Rfl: 3 .  empagliflozin  (JARDIANCE) 10 MG TABS tablet, Take 10 mg by mouth daily., Disp: 30 tablet, Rfl: 3 .  furosemide (LASIX) 40 MG tablet, TAKE ONE (1) TABLET EACH DAY, Disp: 90 tablet, Rfl: 3 .  isosorbide mononitrate (IMDUR) 120 MG 24 hr tablet, TAKE ONE (1) TABLET EACH DAY, Disp: 90 tablet, Rfl: 3 .  lisinopril (ZESTRIL) 40 MG tablet, Take 1 tablet (40 mg total) by mouth daily., Disp: 90 tablet, Rfl: 3 .  meclizine (ANTIVERT) 25 MG tablet, Take 1 tablet (25 mg total) by mouth 3 (three) times daily as needed for dizziness., Disp: 90 tablet, Rfl: 3 .  Misc. Devices (WALL GRAB BAR) MISC, For bathtub/shower dx: M17.0, 173.9, Z91.8, Disp: 1 each, Rfl: 0 .  nitroGLYCERIN (NITROSTAT) 0.4 MG SL tablet, DISSOLVE 1 TAB UNDER TOUNGE FOR CHEST PAIN. MAY REPEAT EVERY 5 MINUTES FOR 3 DOSES. IF NO RELIEF CALL 911 OR GO TO ER, Disp: 25 tablet, Rfl: 6 .  NOVOLOG FLEXPEN 100 UNIT/ML FlexPen, INJECT 35-40 UNITS SQ 3 TIMES DAILY WITH MEALS, Disp: 30 mL, Rfl: 3 .  potassium chloride SA (K-DUR) 20 MEQ tablet, Take 2 tablets (40 mEq total) by mouth daily., Disp: 180 tablet, Rfl: 3 .  rOPINIRole (REQUIP) 0.5 MG tablet, Take 1 tablet (0.5 mg total) by mouth at bedtime., Disp: 90 tablet, Rfl: 3 .  rosuvastatin (CRESTOR) 40 MG tablet, TAKE ONE (1) TABLET EACH DAY, Disp: 90 tablet, Rfl: 3 .  Semaglutide,0.25 or 0.5MG /DOS, (OZEMPIC, 0.25 OR 0.5 MG/DOSE,) 2 MG/1.5ML SOPN, Inject 0.375  mLs (0.5 mg total) into the skin once a week., Disp: 1.5 mL, Rfl: 11 .  TOUJEO SOLOSTAR 300 UNIT/ML SOPN, INJECT 50-60 UNITS SQ DAILY, Disp: 9 mL, Rfl: 2 .  vitamin B-12 (CYANOCOBALAMIN) 1000 MCG tablet, Take 1,000 mcg by mouth daily., Disp: , Rfl:  .  Vitamin D, Ergocalciferol, (DRISDOL) 1.25 MG (50000 UT) CAPS capsule, TAKE 1 CAPSULE EVERY 7 DAYS, Disp: 12 capsule, Rfl: 3  Current Facility-Administered Medications:  .  cyanocobalamin ((VITAMIN B-12)) injection 1,000 mcg, 1,000 mcg, Intramuscular, Q30 days, Dettinger, Fransisca Kaufmann, MD, 1,000 mcg at 12/01/19 0926 .   methylPREDNISolone acetate (DEPO-MEDROL) injection 80 mg, 80 mg, Intramuscular, Once, Dettinger, Fransisca Kaufmann, MD  Allergies  Allergen Reactions  . Iohexol      Desc: pt had syncopal episode with nausea post IV CM late 1990's,  pt has had prednisone prep with heart caths x 2 without problem  kdean 04/16/07, Onset Date: [id number]   . Ticlid [Ticlopidine Hcl] Nausea And Vomiting  . Codeine Nausea And Vomiting and Palpitations   Past Medical History:  Diagnosis Date  . Anxiety   . CAD (coronary artery disease)    DES to circumflex 02/2007, BMS to LAD and PTCA diagonal 03/2007  . Carotid artery plaque    Mild  . Cataract   . Depression   . Diverticulitis, colon   . Elevated d-dimer 01/08/2014  . Essential hypertension, benign   . GERD (gastroesophageal reflux disease)   . H/O hiatal hernia   . HLD (hyperlipidemia)   . IDDM (insulin dependent diabetes mellitus)   . Migraine    "used to have them really bad; don't have them anymore" (01/07/2014)  . MS (multiple sclerosis) ([place])    Not confirmed  . PAT (paroxysmal atrial tachycardia) ([facility])   . Prolapse of uterus   . PVD (peripheral vascular disease) ([place])   . TIA (transient ischemic attack) 1980's    Observations/Objective: A&O  No respiratory distress or wheezing audible over the phone Mood, judgement, and thought processes all WNL  Assessment and Plan: 1. Gastroesophageal reflux disease without esophagitis - Uncontrolled. Rx'd Nexium.  - esomeprazole (NEXIUM) 40 MG capsule; Take 1 capsule (40 mg total) by mouth daily.  Dispense: 30 capsule; Refill: 3   Follow Up Instructions:  I discussed the assessment and treatment plan with the patient. The patient was provided an opportunity to ask questions and all were answered. The patient agreed with the plan and demonstrated an understanding of the instructions.   The patient was advised to call back or seek an in-person evaluation if the symptoms worsen or if the condition fails to  improve as anticipated.  The above assessment and management plan was discussed with the patient. The patient verbalized understanding of and has agreed to the management plan. Patient is aware to call the clinic if symptoms persist or worsen. Patient is aware when to return to the clinic for a follow-up visit. Patient educated on when it is appropriate to go to the emergency department.   Time call ended: 9:46 AM  I provided 7 minutes of non-face-to-face time during this encounter.  Hendricks Limes, MSN, APRN, FNP-C [facility] 03/17/20

## 2020-03-22 ENCOUNTER — Other Ambulatory Visit: Payer: Self-pay | Admitting: Family Medicine

## 2020-04-07 ENCOUNTER — Encounter (HOSPITAL_COMMUNITY): Payer: Self-pay

## 2020-04-07 ENCOUNTER — Other Ambulatory Visit: Payer: Self-pay

## 2020-04-07 ENCOUNTER — Emergency Department (HOSPITAL_COMMUNITY)
Admission: EM | Admit: 2020-04-07 | Discharge: 2020-04-08 | Disposition: A | Discharge status: Home / Self Care | Payer: PPO | Attending: Emergency Medicine | Admitting: Emergency Medicine

## 2020-04-07 DIAGNOSIS — Z794 Long term (current) use of insulin: Secondary | ICD-10-CM | POA: Insufficient documentation

## 2020-04-07 DIAGNOSIS — R000 Tachycardia, unspecified: Secondary | ICD-10-CM | POA: Diagnosis not present

## 2020-04-07 DIAGNOSIS — R42 Dizziness and giddiness: Secondary | ICD-10-CM | POA: Diagnosis not present

## 2020-04-07 DIAGNOSIS — E785 Hyperlipidemia, unspecified: Secondary | ICD-10-CM | POA: Insufficient documentation

## 2020-04-07 DIAGNOSIS — R112 Nausea with vomiting, unspecified: Secondary | ICD-10-CM | POA: Diagnosis not present

## 2020-04-07 DIAGNOSIS — Z8673 Personal history of transient ischemic attack (TIA), and cerebral infarction without residual deficits: Secondary | ICD-10-CM | POA: Insufficient documentation

## 2020-04-07 DIAGNOSIS — Z79899 Other long term (current) drug therapy: Secondary | ICD-10-CM | POA: Diagnosis not present

## 2020-04-07 DIAGNOSIS — E876 Hypokalemia: Secondary | ICD-10-CM | POA: Diagnosis not present

## 2020-04-07 DIAGNOSIS — R21 Rash and other nonspecific skin eruption: Secondary | ICD-10-CM | POA: Diagnosis not present

## 2020-04-07 DIAGNOSIS — R509 Fever, unspecified: Secondary | ICD-10-CM | POA: Diagnosis not present

## 2020-04-07 DIAGNOSIS — R0689 Other abnormalities of breathing: Secondary | ICD-10-CM | POA: Diagnosis not present

## 2020-04-07 DIAGNOSIS — Z7982 Long term (current) use of aspirin: Secondary | ICD-10-CM | POA: Diagnosis not present

## 2020-04-07 DIAGNOSIS — E1169 Type 2 diabetes mellitus with other specified complication: Secondary | ICD-10-CM | POA: Insufficient documentation

## 2020-04-07 DIAGNOSIS — I25119 Atherosclerotic heart disease of native coronary artery with unspecified angina pectoris: Secondary | ICD-10-CM | POA: Diagnosis not present

## 2020-04-07 DIAGNOSIS — I10 Essential (primary) hypertension: Secondary | ICD-10-CM | POA: Insufficient documentation

## 2020-04-07 NOTE — ED Triage Notes (Addendum)
N/v x 2 weeks. Emesis 5 times today Fever on and off 2 weeks.  98.3 in triage  Family wanted her to come.  Alert and oriented.

## 2020-04-08 ENCOUNTER — Emergency Department (HOSPITAL_COMMUNITY): Payer: PPO

## 2020-04-08 DIAGNOSIS — R509 Fever, unspecified: Secondary | ICD-10-CM | POA: Diagnosis not present

## 2020-04-08 DIAGNOSIS — R21 Rash and other nonspecific skin eruption: Secondary | ICD-10-CM | POA: Diagnosis not present

## 2020-04-08 LAB — COMPREHENSIVE METABOLIC PANEL
ALT: 19 U/L (ref 0–44)
AST: 19 U/L (ref 15–41)
Albumin: 3.4 g/dL — ABNORMAL LOW (ref 3.5–5.0)
Alkaline Phosphatase: 62 U/L (ref 38–126)
Anion gap: 14 (ref 5–15)
BUN: 12 mg/dL (ref 8–23)
CO2: 33 mmol/L — ABNORMAL HIGH (ref 22–32)
Calcium: 8.6 mg/dL — ABNORMAL LOW (ref 8.9–10.3)
Chloride: 90 mmol/L — ABNORMAL LOW (ref 98–111)
Creatinine, Ser: 1.23 mg/dL — ABNORMAL HIGH (ref 0.44–1.00)
GFR calc Af Amer: 51 mL/min — ABNORMAL LOW (ref >60)
GFR calc non Af Amer: 44 mL/min — ABNORMAL LOW (ref >60)
Glucose, Bld: 217 mg/dL — ABNORMAL HIGH (ref 70–99)
Potassium: 2.4 mmol/L — CL (ref 3.5–5.1)
Sodium: 137 mmol/L (ref 135–145)
Total Bilirubin: 0.6 mg/dL (ref 0.3–1.2)
Total Protein: 7.5 g/dL (ref 6.5–8.1)

## 2020-04-08 LAB — BASIC METABOLIC PANEL
Anion gap: 10 (ref 5–15)
BUN: 12 mg/dL (ref 8–23)
CO2: 32 mmol/L (ref 22–32)
Calcium: 7.9 mg/dL — ABNORMAL LOW (ref 8.9–10.3)
Chloride: 97 mmol/L — ABNORMAL LOW (ref 98–111)
Creatinine, Ser: 0.97 mg/dL (ref 0.44–1.00)
GFR calc Af Amer: >60 mL/min (ref >60)
GFR calc non Af Amer: 59 mL/min — ABNORMAL LOW (ref >60)
Glucose, Bld: 220 mg/dL — ABNORMAL HIGH (ref 70–99)
Potassium: 3.3 mmol/L — ABNORMAL LOW (ref 3.5–5.1)
Sodium: 139 mmol/L (ref 135–145)

## 2020-04-08 LAB — CBC WITH DIFFERENTIAL/PLATELET
Abs Immature Granulocytes: 0.05 10*3/uL (ref 0.00–0.07)
Basophils Absolute: 0 10*3/uL (ref 0.0–0.1)
Basophils Relative: 0 %
Eosinophils Absolute: 0 10*3/uL (ref 0.0–0.5)
Eosinophils Relative: 0 %
HCT: 33.7 % — ABNORMAL LOW (ref 36.0–46.0)
Hemoglobin: 11.5 g/dL — ABNORMAL LOW (ref 12.0–15.0)
Immature Granulocytes: 0 %
Lymphocytes Relative: 7 %
Lymphs Abs: 0.9 10*3/uL (ref 0.7–4.0)
MCH: 30 pg (ref 26.0–34.0)
MCHC: 34.1 g/dL (ref 30.0–36.0)
MCV: 88 fL (ref 80.0–100.0)
Monocytes Absolute: 0.8 10*3/uL (ref 0.1–1.0)
Monocytes Relative: 6 %
Neutro Abs: 10.7 10*3/uL — ABNORMAL HIGH (ref 1.7–7.7)
Neutrophils Relative %: 87 %
Platelets: 196 10*3/uL (ref 150–400)
RBC: 3.83 MIL/uL — ABNORMAL LOW (ref 3.87–5.11)
RDW: 13.1 % (ref 11.5–15.5)
WBC: 12.5 10*3/uL — ABNORMAL HIGH (ref 4.0–10.5)
nRBC: 0 % (ref 0.0–0.2)

## 2020-04-08 LAB — LACTIC ACID, PLASMA
Lactic Acid, Venous: 1.5 mmol/L (ref 0.5–1.9)
Lactic Acid, Venous: 1.3 mmol/L (ref 0.5–1.9)

## 2020-04-08 MED ORDER — ONDANSETRON HCL 4 MG PO TABS: 4.0000 mg | ORAL_TABLET | Freq: Four times a day (QID) | ORAL | 0 refills | Status: DC | PRN

## 2020-04-08 MED ORDER — POTASSIUM CHLORIDE 10 MEQ/100ML IV SOLN
10.0000 meq | INTRAVENOUS | Status: AC
  Administered 2020-04-08 (×4): 10 meq via INTRAVENOUS
  Filled 2020-04-08 (×4): qty 100

## 2020-04-08 MED ORDER — SODIUM CHLORIDE 0.9 % IV BOLUS
1000.0000 mL | Freq: Once | INTRAVENOUS | Status: AC
  Administered 2020-04-08: 1000 mL via INTRAVENOUS

## 2020-04-08 MED ORDER — ONDANSETRON HCL 4 MG/2ML IJ SOLN
4.0000 mg | Freq: Once | INTRAMUSCULAR | Status: AC
  Administered 2020-04-08: 4 mg via INTRAVENOUS
  Filled 2020-04-08: qty 2

## 2020-04-08 MED ORDER — POTASSIUM CHLORIDE CRYS ER 20 MEQ PO TBCR
40.0000 meq | EXTENDED_RELEASE_TABLET | Freq: Once | ORAL | Status: AC
  Administered 2020-04-08: 40 meq via ORAL
  Filled 2020-04-08: qty 2

## 2020-04-08 NOTE — ED Notes (Signed)
Pt placed on cardiac monitor due to low K

## 2020-04-08 NOTE — ED Notes (Signed)
Daughter updated on pt care 

## 2020-04-08 NOTE — Discharge Instructions (Signed)
Increase your potassium to two pills twice a day for the next ten days.  Return if you are having any problems.

## 2020-04-08 NOTE — ED Notes (Signed)
Son called & updated on plan of care

## 2020-04-08 NOTE — ED Provider Notes (Signed)
[facility] EMERGENCY DEPARTMENT Provider Note   CSN: [id number] Arrival date & time: 04/07/20  2042   History Chief Complaint  Patient presents with   Nausea   Emesis    [name] is a 69 y.o. female.  The history is provided by the patient.  Emesis She has history of diabetes, hyperlipidemia, coronary artery disease and comes in because of intermittent fevers, nausea, vomiting over the last 2 weeks.  Temperatures have been as high as 104.  There have been associated chills and sweats.  She denies diarrhea and states she actually has been constipated.  She denies any cough or urinary difficulty.  She denies any generalized weakness and denies any pain.  She has been taking oral antiemetics as needed, generally with relief.  She has tried taking some laxatives without relief of constipation.  Of note, she has had days without fever and without emesis.  The last 24 hours, she has had 5 episodes of emesis.  She denies any sick contacts.  Past Medical History:  Diagnosis Date   Anxiety    CAD (coronary artery disease)    DES to circumflex 02/2007, BMS to LAD and PTCA diagonal 03/2007   Carotid artery plaque    Mild   Cataract    Depression    Diverticulitis, colon    Elevated d-dimer 01/08/2014   Essential hypertension, benign    GERD (gastroesophageal reflux disease)    H/O hiatal hernia    HLD (hyperlipidemia)    IDDM (insulin dependent diabetes mellitus)    Migraine    "used to have them really bad; don't have them anymore" (01/07/2014)   MS (multiple sclerosis) ([place])    Not confirmed   PAT (paroxysmal atrial tachycardia) ([place])    Prolapse of uterus    PVD (peripheral vascular disease) ([place])    TIA (transient ischemic attack) 1980's    Patient Active Problem List   Diagnosis Date Noted   Osteopenia after menopause 12/13/2017   H/O prolonged Q-T interval on ECG 03/06/2017   B12 deficiency 01/13/2016   History of stroke 09/01/2015    History of TIA (transient ischemic attack) 08/14/2014   DM type 2 with diabetic dyslipidemia ([place]) 08/14/2014   Cataract 02/08/2014   Macular degeneration 02/08/2014   Accelerating angina (HCC) 01/07/2014   Generalized anxiety disorder 09/14/2013   Claudication ([place]) 06/18/2013   GERD (gastroesophageal reflux disease) 01/01/2012   Uncontrolled type 2 diabetes mellitus with insulin therapy ([place]) 08/23/2011   Essential hypertension 08/23/2011   PALPITATIONS 02/10/2010   Hyperlipidemia 05/10/2009   Depression with anxiety 05/10/2009   Coronary artery disease involving native coronary artery of native heart with angina pectoris ([place]) 08/26/2008    Past Surgical History:  Procedure Laterality Date   ABDOMINAL HYSTERECTOMY  1986   ovaries remain - prolaspe uterus    APPENDECTOMY  ~ 1970   BREAST BIOPSY Right 1980's   BREAST LUMPECTOMY Right 1980's   Dr. Charlynne Pander    CARDIAC CATHETERIZATION  01/07/2014   CHOLECYSTECTOMY  ?1987   COLONOSCOPY  2002   Dr. Anwar--> Severe diverticular changes in the region of the sigmoid and descending colon with scattered diverticular changes throughout the rest of the colon. No polyps, ulcerations. Despite numerous manipulations, the tip of the scope could not be tipped into the cecal area.   COLONOSCOPY  01/10/2012   Procedure: COLONOSCOPY;  Surgeon: Daneil Dolin, MD;  Location: [facility];  Service: Endoscopy;  Laterality: N/A;  1:55  CORONARY ANGIOPLASTY WITH STENT PLACEMENT  ~ 1997 X 2   "2 + 1"   EYE SURGERY Bilateral 2014   cataract   INTRAVASCULAR PRESSURE WIRE/FFR STUDY N/A 03/08/2017   Procedure: Intravascular Pressure Wire/FFR Study;  Surgeon: Nelva Bush, MD;  Location: [facility];  Service: Cardiovascular;  Laterality: N/A;   LEFT HEART CATH AND CORONARY ANGIOGRAPHY N/A 03/08/2017   Procedure: Left Heart Cath and Coronary Angiography;  Surgeon: Nelva Bush, MD;  Location: [facility];   Service: Cardiovascular;  Laterality: N/A;   LEFT HEART CATHETERIZATION WITH CORONARY ANGIOGRAM N/A 01/07/2014   Procedure: LEFT HEART CATHETERIZATION WITH CORONARY ANGIOGRAM;  Surgeon: Larey Dresser, MD;  Location: [facility] C[facility] LAB;  Service: Cardiovascular;  Laterality: N/A;     OB History   No obstetric history on file.     Family History  Problem Relation Age of Onset   Heart attack Mother 9   Diabetes Mother    Hypertension Mother    Heart attack Father 34   Heart attack Brother 88       x 6   Heart disease Brother    Diabetes Brother    Colon cancer Paternal Aunt        36s, died with brain anuerysm   Crohn's disease Cousin        paternal   Diabetes Sister    GER disease Daughter    Cervical cancer Daughter    Diabetes Daughter     Social History   Tobacco Use   Smoking status: Never Smoker   Smokeless tobacco: Never Used   Tobacco comment: spouse, 77 years - husband has quit 01/2011  Vaping Use   Vaping Use: Never used  Substance Use Topics   Alcohol use: No   Drug use: No    Home Medications Prior to Admission medications   Medication Sig Start Date End Date Taking? Authorizing Provider  amLODipine (NORVASC) 5 MG tablet TAKE ONE (1) TABLET EACH DAY 11/04/19   Dettinger, Fransisca Kaufmann, MD  aspirin 325 MG tablet Take 325 mg by mouth daily.    [provider]  atenolol (TENORMIN) 50 MG tablet Take 1 tablet (50 mg total) by mouth 2 (two) times daily. 01/13/19   Chipper Herb, MD  citalopram (CELEXA) 40 MG tablet TAKE ONE (1) TABLET EACH DAY 06/17/19   Dettinger, Fransisca Kaufmann, MD  clonazePAM (KLONOPIN) 0.5 MG tablet Take 1 tablet (0.5 mg total) by mouth at bedtime as needed for anxiety. 06/17/19   Dettinger, Fransisca Kaufmann, MD  Continuous Blood Gluc Sensor (FREESTYLE LIBRE 14 DAY SENSOR) MISC USE TO CHECK BLOOD GLUCOSE 12/21/19   Dettinger, Fransisca Kaufmann, MD  empagliflozin (JARDIANCE) 10 MG TABS tablet Take 10 mg by mouth daily. 02/20/19   Chipper Herb, MD   esomeprazole (NEXIUM) 40 MG capsule Take 1 capsule (40 mg total) by mouth daily. 03/17/20   Loman Brooklyn, FNP  furosemide (LASIX) 40 MG tablet TAKE ONE (1) TABLET EACH DAY 01/13/19   Chipper Herb, MD  isosorbide mononitrate (IMDUR) 120 MG 24 hr tablet TAKE ONE (1) TABLET EACH DAY 03/22/20   Dettinger, Fransisca Kaufmann, MD  lisinopril (ZESTRIL) 40 MG tablet Take 1 tablet (40 mg total) by mouth daily. 01/13/19   Chipper Herb, MD  meclizine (ANTIVERT) 25 MG tablet Take 1 tablet (25 mg total) by mouth 3 (three) times daily as needed for dizziness. 11/26/19   Dettinger, Fransisca Kaufmann, MD  Misc. Devices (WALL GRAB  BAR) MISC For bathtub/shower dx: M17.0, 173.9, Z91.8 05/12/19   Dettinger, Fransisca Kaufmann, MD  nitroGLYCERIN (NITROSTAT) 0.4 MG SL tablet DISSOLVE 1 TAB UNDER TOUNGE FOR CHEST PAIN. MAY REPEAT EVERY 5 MINUTES FOR 3 DOSES. IF NO RELIEF CALL 911 OR GO TO ER 12/11/17   Chipper Herb, MD  NOVOLOG FLEXPEN 100 UNIT/ML FlexPen INJECT 35-40 UNITS SQ 3 TIMES DAILY WITH MEALS 09/15/19   Dettinger, Fransisca Kaufmann, MD  potassium chloride SA (K-DUR) 20 MEQ tablet Take 2 tablets (40 mEq total) by mouth daily. 01/13/19   Chipper Herb, MD  rOPINIRole (REQUIP) 0.5 MG tablet Take 1 tablet (0.5 mg total) by mouth at bedtime. 09/30/19   Dettinger, Fransisca Kaufmann, MD  rosuvastatin (CRESTOR) 40 MG tablet TAKE ONE (1) TABLET EACH DAY 01/13/19   Chipper Herb, MD  Semaglutide,0.25 or 0.5MG /DOS, (OZEMPIC, 0.25 OR 0.5 MG/DOSE,) 2 MG/1.5ML SOPN Inject 0.375 mLs (0.5 mg total) into the skin once a week. 03/02/20   Dettinger, Fransisca Kaufmann, MD  TOUJEO SOLOSTAR 300 UNIT/ML SOPN INJECT 50-60 UNITS SQ DAILY 09/15/19   Dettinger, Fransisca Kaufmann, MD  vitamin B-12 (CYANOCOBALAMIN) 1000 MCG tablet Take 1,000 mcg by mouth daily.    [provider]  Vitamin D, Ergocalciferol, (DRISDOL) 1.25 MG (50000 UT) CAPS capsule TAKE 1 CAPSULE EVERY 7 DAYS 01/13/19   Chipper Herb, MD    Allergies    Iohexol, Ticlid [ticlopidine hcl], and Codeine  Review of Systems    Review of Systems  Gastrointestinal: Positive for vomiting.  All other systems reviewed and are negative.   Physical Exam Updated Vital Signs BP (!) 143/63    Pulse 78    Temp 98.6 F (37 C)    Resp 15    Ht 5\' 6"  (1.676 m)    Wt 79.5 kg    SpO2 97%    BMI 28.29 kg/m   Physical Exam Vitals and nursing note reviewed.   69 year old female, resting comfortably and in no acute distress. Vital signs are significant for borderline elevated blood pressure. Oxygen saturation is 97%, which is normal.  She is nontoxic in appearance. Head is normocephalic and atraumatic. PERRLA, EOMI. Oropharynx is clear. Neck is nontender and supple without adenopathy or JVD. Back is nontender and there is no CVA tenderness. Lungs are clear without rales, wheezes, or rhonchi. Chest is nontender. Heart has regular rate and rhythm without murmur. Abdomen is soft, flat, nontender without masses or hepatosplenomegaly and peristalsis is hypoactive. Extremities have no cyanosis or edema, full range of motion is present. Skin is warm and dry without rash. Neurologic: Mental status is normal, cranial nerves are intact, there are no motor or sensory deficits.  ED Results / Procedures / Treatments   Labs (all labs ordered are listed, but only abnormal results are displayed) Labs Reviewed  COMPREHENSIVE METABOLIC PANEL - Abnormal; Notable for the following components:      Result Value   Potassium 2.4 (*)    Chloride 90 (*)    CO2 33 (*)    Glucose, Bld 217 (*)    Creatinine, Ser 1.23 (*)    Calcium 8.6 (*)    Albumin 3.4 (*)    GFR calc non Af Amer 44 (*)    GFR calc Af Amer 51 (*)    All other components within normal limits  CBC WITH DIFFERENTIAL/PLATELET - Abnormal; Notable for the following components:   WBC 12.5 (*)    RBC 3.83 (*)    Hemoglobin  11.5 (*)    HCT 33.7 (*)    Neutro Abs 10.7 (*)    All other components within normal limits  BASIC METABOLIC PANEL - Abnormal; Notable for the following  components:   Potassium 3.3 (*)    Chloride 97 (*)    Glucose, Bld 220 (*)    Calcium 7.9 (*)    GFR calc non Af Amer 59 (*)    All other components within normal limits  CULTURE, BLOOD (ROUTINE X 2)  CULTURE, BLOOD (ROUTINE X 2)  LACTIC ACID, PLASMA  LACTIC ACID, PLASMA  URINALYSIS, ROUTINE W REFLEX MICROSCOPIC   Radiology DG Chest Port 1 View  Result Date: 04/08/2020 CLINICAL DATA:  69 year old female with fever. EXAM: PORTABLE CHEST 1 VIEW COMPARISON:  Chest radiograph dated 12/11/2017. FINDINGS: No focal consolidation, pleural effusion, or pneumothorax. The cardiac silhouette is within limits. Atherosclerotic calcification of the aorta. No acute osseous pathology. IMPRESSION: No active disease. Electronically Signed   By: Anner Crete M.D.   On: 04/08/2020 01:06    Procedures Procedures   Medications Ordered in ED Medications  ondansetron (ZOFRAN) injection 4 mg (4 mg Intravenous Given 04/08/20 0041)  sodium chloride 0.9 % bolus 1,000 mL (1,000 mLs Intravenous New Bag/Given 04/08/20 0148)  potassium chloride SA (KLOR-CON) CR tablet 40 mEq (40 mEq Oral Given 04/08/20 0147)  potassium chloride 10 mEq in 100 mL IVPB (0 mEq Intravenous Stopped 04/08/20 0616)    ED Course  I have reviewed the triage vital signs and the nursing notes.  Pertinent labs & imaging results that were available during my care of the patient were reviewed by me and considered in my medical decision making (see chart for details).  MDM Rules/Calculators/A&P Intermittent fever and emesis.  Exam is benign and she is nontoxic in appearance.  Will check screening labs and chest x-ray as well as urinalysis.  Old records are reviewed, and she has no relevant past visits.  Labs show severe hypokalemia with potassium 2.4, mild elevation of creatinine over baseline.  Mild leukocytosis is present, and there is has been a slight drop in hemoglobin.  Lactate is normal.  Chest x-ray shows no evidence of pneumonia.  She  will be given IV fluids, IV and oral potassium.  Following above-noted treatment, metabolic panel was repeated and potassium has come up to 3.3.  This is a safe level for discharge.  Also, creatinine has improved.  She has tolerated oral fluids.  She has a prescription for potassium at home and she is advised to double the dose for the next 10 days.  She is discharged with prescription for ondansetron.  Return precautions discussed.  Final Clinical Impression(s) / ED Diagnoses Final diagnoses:  Non-intractable vomiting with nausea, unspecified vomiting type  Hypokalemia due to excessive gastrointestinal loss of potassium    Rx / DC Orders ED Discharge Orders    None       Delora Fuel, MD 43/15/40 [phone]

## 2020-04-08 NOTE — ED Notes (Signed)
Date and time results received: 04/08/20 0110 (use smartphrase ".now" to insert current time)  Test: potassium Critical Value: 2.4  Name of Provider Notified: Dr Roxanne Mins  Orders Received? Or Actions Taken?: Actions Taken: no orders received

## 2020-04-08 NOTE — ED Notes (Signed)
Pt unable to urinate at this time, aware of DO, call light w/in reach

## 2020-04-13 ENCOUNTER — Ambulatory Visit: Payer: PPO | Admitting: Pharmacist

## 2020-04-13 LAB — CULTURE, BLOOD (ROUTINE X 2)
Culture: NO GROWTH
Culture: NO GROWTH
Special Requests: ADEQUATE
Special Requests: ADEQUATE

## 2020-04-14 ENCOUNTER — Other Ambulatory Visit: Payer: Self-pay | Admitting: Family Medicine

## 2020-04-20 ENCOUNTER — Other Ambulatory Visit: Payer: Self-pay

## 2020-04-20 ENCOUNTER — Ambulatory Visit (INDEPENDENT_AMBULATORY_CARE_PROVIDER_SITE_OTHER): Payer: PPO | Admitting: Pharmacist

## 2020-04-20 ENCOUNTER — Encounter: Payer: Self-pay | Admitting: Pharmacist

## 2020-04-20 VITALS — BP 154/85 | HR 90

## 2020-04-20 DIAGNOSIS — N183 Chronic kidney disease, stage 3 unspecified: Secondary | ICD-10-CM

## 2020-04-20 DIAGNOSIS — E785 Hyperlipidemia, unspecified: Secondary | ICD-10-CM | POA: Diagnosis not present

## 2020-04-20 DIAGNOSIS — Z794 Long term (current) use of insulin: Secondary | ICD-10-CM | POA: Diagnosis not present

## 2020-04-20 DIAGNOSIS — E1165 Type 2 diabetes mellitus with hyperglycemia: Secondary | ICD-10-CM

## 2020-04-20 DIAGNOSIS — IMO0002 Reserved for concepts with insufficient information to code with codable children: Secondary | ICD-10-CM

## 2020-04-20 DIAGNOSIS — E1169 Type 2 diabetes mellitus with other specified complication: Secondary | ICD-10-CM

## 2020-04-20 DIAGNOSIS — E1122 Type 2 diabetes mellitus with diabetic chronic kidney disease: Secondary | ICD-10-CM | POA: Diagnosis not present

## 2020-04-20 MED ORDER — OZEMPIC (0.25 OR 0.5 MG/DOSE) 2 MG/1.5ML ~~LOC~~ SOPN: 0.5000 mg | PEN_INJECTOR | SUBCUTANEOUS | 11 refills | Status: DC

## 2020-04-20 NOTE — Progress Notes (Signed)
° ° °  04/20/2020 Name: [name] MRN: [id number] DOB: 11-09-1950   S:  57 yoF presents for diabetes evaluation, education, and management Patient was referred and last seen by Primary Care Provider on 03/17/20.  Insurance coverage/medication affordability: [employer] medicare  Patient reports adherence with medications.  Current diabetes medications include: metformin, insulin, ozempic, jardiance 10  Current hypertension medications include: amlodipine, atenolol Goal 130/80  Current hyperlipidemia medications include: rosuvastatin   Patient reports hypoglycemic events.   Patient reported dietary habits: Eats 2-3 meals/day Discussed meal planning options and Plate method for healthy eating  Avoid sugary drinks and desserts  Incorporate balanced protein, non starchy veggies, 1 serving of carbohydrate with each meal  Increase water intake  Increase physical activity as able  Patient-reported exercise habits: n/a  O:  Lab Results  Component Value Date   HGBA1C 8.6 (H) 03/02/2020    Vitals:   04/28/20 1523  BP: (!) 154/85  Pulse: 90   Lipid Panel     Component Value Date/Time   CHOL 128 03/02/2020 0941   CHOL 107 02/05/2013 1002   TRIG 146 03/02/2020 0941   TRIG 149 11/30/2015 0947   TRIG 143 02/05/2013 1002   HDL 34 (L) 03/02/2020 0941   HDL 35 (L) 11/30/2015 0947   HDL 35 (L) 02/05/2013 1002   CHOLHDL 3.8 03/02/2020 0941   CHOLHDL 3.9 09/22/2017 0518   VLDL 26 09/22/2017 0518   LDLCALC 68 03/02/2020 0941   LDLCALC 195 (H) 04/26/2014 1029   LDLCALC 43 02/05/2013 1002   LDLDIRECT 93 04/06/2015 1038    Home fasting blood sugars:  avg 181 last 7 days avg 188 last 14 avg 173 last 30  running low at night Last low <70 on 03/02/20  Reports libre falling off--recommended tegaderm  A/P:   Diabetes T2DM currently uncontrolled. Patient is able to verbalize appropriate hypoglycemia management plan. Patient is adherent with medication.   -Continue GLP-1  Ozempic (generic name: semglutide)   Continue Instructions: Inject 0.5 mg into the skin weekly  Demonstration and teaching provided  RX sent to health dept on 03/02/20     -Continue basal insulin at 45 units daily  -Continue Novolog 20-25 units with meals twice daily  -Continue Jardiance 10 mg daily (can increase to 25mg  daily as tolerated)  -Patient complaining of not sleeping well--will route to PCP  She is no longer on ambien or benzo for sleep  Can explore another medication  -Extensively discussed pathophysiology of diabetes, recommended lifestyle interventions, dietary effects on blood sugar control  -Counseled on s/sx of and management of hypoglycemia  -Next A1C anticipated 06/02/20.   Written patient instructions provided.  Total time in face to face counseling 30 minutes.   Follow up Pharmacist on 05/11/20 Follow up PCP Clinic Visit ON 06/02/20.     Regina Eck, PharmD, BCPS Clinical Pharmacist, [place]  II Phone [phone]

## 2020-05-11 ENCOUNTER — Ambulatory Visit: Payer: Self-pay | Admitting: Pharmacist

## 2020-05-13 ENCOUNTER — Other Ambulatory Visit: Payer: Self-pay | Admitting: Family Medicine

## 2020-05-31 ENCOUNTER — Other Ambulatory Visit: Payer: Self-pay

## 2020-05-31 ENCOUNTER — Encounter: Payer: Self-pay | Admitting: Family Medicine

## 2020-05-31 ENCOUNTER — Ambulatory Visit (INDEPENDENT_AMBULATORY_CARE_PROVIDER_SITE_OTHER): Payer: PPO | Admitting: Family Medicine

## 2020-05-31 ENCOUNTER — Telehealth: Payer: Self-pay | Admitting: Family Medicine

## 2020-05-31 VITALS — BP 124/75 | HR 101 | Temp 98.7°F | Ht 66.0 in | Wt 161.4 lb

## 2020-05-31 DIAGNOSIS — R112 Nausea with vomiting, unspecified: Secondary | ICD-10-CM

## 2020-05-31 DIAGNOSIS — E1169 Type 2 diabetes mellitus with other specified complication: Secondary | ICD-10-CM

## 2020-05-31 DIAGNOSIS — M6281 Muscle weakness (generalized): Secondary | ICD-10-CM | POA: Diagnosis not present

## 2020-05-31 DIAGNOSIS — E785 Hyperlipidemia, unspecified: Secondary | ICD-10-CM | POA: Diagnosis not present

## 2020-05-31 DIAGNOSIS — Z8639 Personal history of other endocrine, nutritional and metabolic disease: Secondary | ICD-10-CM | POA: Diagnosis not present

## 2020-05-31 DIAGNOSIS — Z1211 Encounter for screening for malignant neoplasm of colon: Secondary | ICD-10-CM

## 2020-05-31 DIAGNOSIS — R197 Diarrhea, unspecified: Secondary | ICD-10-CM | POA: Diagnosis not present

## 2020-05-31 DIAGNOSIS — E119 Type 2 diabetes mellitus without complications: Secondary | ICD-10-CM | POA: Diagnosis not present

## 2020-05-31 MED ORDER — FUROSEMIDE 20 MG PO TABS: ORAL_TABLET | ORAL | 0 refills | Status: DC

## 2020-05-31 MED ORDER — SPIRONOLACTONE 25 MG PO TABS: 25.0000 mg | ORAL_TABLET | Freq: Every day | ORAL | 0 refills | Status: DC

## 2020-05-31 NOTE — Progress Notes (Signed)
Subjective: CC: low potatssium PCP: Dettinger, Fransisca Kaufmann, MD  VPX:TGGYI D [name] is a 69 y.o. female presenting to clinic today for:  1. Vomiting Reports vomiting 3 days ago with 4-5 episodes in 1 day. Since them vomiting has resolved, but continues to have some nausea that is relieved by Zofran. She has been able to eat small amounts of food throughout the day for the last 2 days. She is able to keep fluids down, although she admits, not drinking as much fluid as she typically does. She is urinated a little less than usual, but is urinating. She has been adjusting her meal time insulin based on her sugars and is making sure to eat at her insulin. Reviewed glucose meter log with patient, with sugars ranging from 76-298 over the last few days, with most between 130-150.  2. Diarrhea Diarrhea x 1 day, with about 5 episodes and loose stools. Reports that she is typically constipated.   3. Muscle weakness Reports weakness in lower legs for "awhile" and is unable to give a clearer time line. She reports worsening of this and that when she stands up her legs "feel like noodles" since yesterday. She also reports fatigue. She has a friend that is currently staying with her for help as needed.   4. Hx of low potassium She was seen in the ER for vomiting and diarrhea in July of this year and was treated for low potassium. She reports she "feels the same as when my potassium was low". She takes lasix daily. She has not been taking her potassium supplement as prescribed. She reports taking it 2-3x a weeks and recently noticed that what she has at home expired 2 years ago.     Relevant past medical, surgical, family, and social history reviewed and updated as indicated.  Allergies and medications reviewed and updated.  Allergies  Allergen Reactions  . Iohexol      Desc: pt had syncopal episode with nausea post IV CM late 1990's,  pt has had prednisone prep with heart caths x 2 without problem   kdean 04/16/07, Onset Date: [id number]   . Ticlid [Ticlopidine Hcl] Nausea And Vomiting  . Codeine Nausea And Vomiting and Palpitations   Past Medical History:  Diagnosis Date  . Anxiety   . CAD (coronary artery disease)    DES to circumflex 02/2007, BMS to LAD and PTCA diagonal 03/2007  . Carotid artery plaque    Mild  . Cataract   . Depression   . Diverticulitis, colon   . Elevated d-dimer 01/08/2014  . Essential hypertension, benign   . GERD (gastroesophageal reflux disease)   . H/O hiatal hernia   . HLD (hyperlipidemia)   . IDDM (insulin dependent diabetes mellitus)   . Migraine    "used to have them really bad; don't have them anymore" (01/07/2014)  . MS (multiple sclerosis) ([place])    Not confirmed  . PAT (paroxysmal atrial tachycardia) ([place])   . Prolapse of uterus   . PVD (peripheral vascular disease) ([place])   . TIA (transient ischemic attack) 1980's    Current Outpatient Medications:  .  amLODipine (NORVASC) 5 MG tablet, TAKE ONE (1) TABLET EACH DAY, Disp: 90 tablet, Rfl: 0 .  aspirin 325 MG tablet, Take 325 mg by mouth daily., Disp: , Rfl:  .  atenolol (TENORMIN) 50 MG tablet, Take 1 tablet (50 mg total) by mouth 2 (two) times daily., Disp: 180 tablet, Rfl: 3 .  citalopram (CELEXA) 40 MG  tablet, TAKE ONE (1) TABLET EACH DAY, Disp: 90 tablet, Rfl: 3 .  clonazePAM (KLONOPIN) 0.5 MG tablet, Take 1 tablet (0.5 mg total) by mouth at bedtime as needed for anxiety., Disp: 30 tablet, Rfl: 2 .  Continuous Blood Gluc Sensor (FREESTYLE LIBRE 14 DAY SENSOR) MISC, USE TO CHECK BLOOD GLUCOSE, Disp: 8 each, Rfl: 3 .  empagliflozin (JARDIANCE) 10 MG TABS tablet, Take 10 mg by mouth daily., Disp: 30 tablet, Rfl: 3 .  esomeprazole (NEXIUM) 40 MG capsule, Take 1 capsule (40 mg total) by mouth daily., Disp: 30 capsule, Rfl: 3 .  furosemide (LASIX) 40 MG tablet, TAKE ONE (1) TABLET EACH DAY, Disp: 90 tablet, Rfl: 3 .  isosorbide mononitrate (IMDUR) 120 MG 24 hr tablet, TAKE ONE (1) TABLET EACH  DAY, Disp: 90 tablet, Rfl: 1 .  lisinopril (ZESTRIL) 40 MG tablet, Take 1 tablet (40 mg total) by mouth daily., Disp: 90 tablet, Rfl: 3 .  meclizine (ANTIVERT) 25 MG tablet, Take 1 tablet (25 mg total) by mouth 3 (three) times daily as needed for dizziness., Disp: 90 tablet, Rfl: 3 .  Misc. Devices (WALL GRAB BAR) MISC, For bathtub/shower dx: M17.0, 173.9, Z91.8, Disp: 1 each, Rfl: 0 .  nitroGLYCERIN (NITROSTAT) 0.4 MG SL tablet, DISSOLVE 1 TAB UNDER TOUNGE FOR CHEST PAIN. MAY REPEAT EVERY 5 MINUTES FOR 3 DOSES. IF NO RELIEF CALL 911 OR GO TO ER, Disp: 25 tablet, Rfl: 6 .  NOVOLOG FLEXPEN 100 UNIT/ML FlexPen, INJECT 35-40 UNITS SQ 3 TIMES DAILY WITH MEALS (Patient taking differently: 25 Units. ), Disp: 30 mL, Rfl: 3 .  ondansetron (ZOFRAN) 4 MG tablet, Take 1 tablet (4 mg total) by mouth every 6 (six) hours as needed., Disp: 30 tablet, Rfl: 0 .  potassium chloride SA (K-DUR) 20 MEQ tablet, Take 2 tablets (40 mEq total) by mouth daily., Disp: 180 tablet, Rfl: 3 .  rOPINIRole (REQUIP) 0.5 MG tablet, Take 1 tablet (0.5 mg total) by mouth at bedtime., Disp: 90 tablet, Rfl: 3 .  rosuvastatin (CRESTOR) 40 MG tablet, TAKE ONE (1) TABLET EACH DAY, Disp: 90 tablet, Rfl: 3 .  Semaglutide,0.25 or 0.5MG/DOS, (OZEMPIC, 0.25 OR 0.5 MG/DOSE,) 2 MG/1.5ML SOPN, Inject 0.375 mLs (0.5 mg total) into the skin once a week., Disp: 1.5 mL, Rfl: 11 .  TOUJEO SOLOSTAR 300 UNIT/ML SOPN, INJECT 50-60 UNITS SQ DAILY, Disp: 9 mL, Rfl: 2 .  vitamin B-12 (CYANOCOBALAMIN) 1000 MCG tablet, Take 1,000 mcg by mouth daily., Disp: , Rfl:  .  Vitamin D, Ergocalciferol, (DRISDOL) 1.25 MG (50000 UT) CAPS capsule, TAKE 1 CAPSULE EVERY 7 DAYS, Disp: 12 capsule, Rfl: 3  Current Facility-Administered Medications:  .  methylPREDNISolone acetate (DEPO-MEDROL) injection 80 mg, 80 mg, Intramuscular, Once, Dettinger, Fransisca Kaufmann, MD  Family History  Problem Relation Age of Onset  . Heart attack Mother 75  . Diabetes Mother   . Hypertension  Mother   . Heart attack Father 8  . Heart attack Brother 32       x 6  . Heart disease Brother   . Diabetes Brother   . Colon cancer Paternal Aunt        14s, died with brain anuerysm  . Crohn's disease Cousin        paternal  . Diabetes Sister   . GER disease Daughter   . Cervical cancer Daughter   . Diabetes Daughter     Review of Systems  Constitutional: Positive for fatigue. Negative for chills, diaphoresis and fever.  Eyes:  Negative for visual disturbance.  Respiratory: Negative for chest tightness.   Cardiovascular: Negative for chest pain, palpitations and leg swelling.  Gastrointestinal: Positive for diarrhea, nausea and vomiting. Negative for abdominal pain and blood in stool.  Genitourinary: Positive for decreased urine volume. Negative for difficulty urinating and frequency.  Neurological: Positive for weakness. Negative for dizziness and speech difficulty.  Psychiatric/Behavioral: Negative for confusion.     Objective: Office vital signs reviewed. BP 124/75   Pulse (!) 101   Temp 98.7 F (37.1 C) (Oral)   Ht 5' 6" (1.676 m)   Wt 161 lb 6 oz (73.2 kg)   BMI 26.05 kg/m   Physical Examination:  Physical Exam Constitutional:      General: She is not in acute distress.    Appearance: Normal appearance. She is not ill-appearing or diaphoretic.  HENT:     Head: Atraumatic.     Right Ear: Tympanic membrane, ear canal and external ear normal.     Left Ear: Tympanic membrane, ear canal and external ear normal.     Nose: Nose normal.  Eyes:     Extraocular Movements: Extraocular movements intact.     Conjunctiva/sclera: Conjunctivae normal.     Pupils: Pupils are equal, round, and reactive to light.  Cardiovascular:     Rate and Rhythm: Normal rate and regular rhythm.     Pulses: Normal pulses.     Heart sounds: Normal heart sounds. No murmur heard.  No friction rub. No gallop.      Comments: HR at 88 bpm on auscultation during exam Pulmonary:     Effort:  Pulmonary effort is normal.     Breath sounds: Normal breath sounds. No rales.  Abdominal:     General: Bowel sounds are normal. There is no distension.     Palpations: Abdomen is soft.     Tenderness: There is no abdominal tenderness. There is no guarding or rebound.  Musculoskeletal:        General: No swelling or tenderness.     Right lower leg: No edema.     Left lower leg: No edema.  Skin:    General: Skin is warm and dry.     Capillary Refill: Capillary refill takes 2 to 3 seconds.  Neurological:     General: No focal deficit present.     Mental Status: She is alert and oriented to person, place, and time.     Motor: Weakness (strength 4/5 in lower extremities bilaterally) present.     Coordination: Coordination normal.     Gait: Gait normal.     Deep Tendon Reflexes:     Reflex Scores:      Patellar reflexes are 2+ on the right side and 2+ on the left side. Psychiatric:        Mood and Affect: Mood normal.        Behavior: Behavior normal.      Results for orders placed or performed during the hospital encounter of 04/07/20  Culture, blood (routine x 2)   Specimen: BLOOD  Result Value Ref Range   Specimen Description BLOOD RIGHT ANTECUBITAL    Special Requests      BOTTLES DRAWN AEROBIC AND ANAEROBIC Blood Culture adequate volume   Culture      NO GROWTH 5 DAYS Performed at [facility], [address] [place], [place] [address]    Report Status 04/13/2020 FINAL   Culture, blood (routine x 2)   Specimen: BLOOD RIGHT HAND  Result Value  Ref Range   Specimen Description BLOOD RIGHT HAND    Special Requests      BOTTLES DRAWN AEROBIC AND ANAEROBIC Blood Culture adequate volume   Culture      NO GROWTH 5 DAYS Performed at [facility], [address], [place], [place] [address]    Report Status 04/13/2020 FINAL   Comprehensive metabolic panel  Result Value Ref Range   Sodium 137 135 - 145 mmol/L   Potassium 2.4 (LL) 3.5 - 5.1 mmol/L   Chloride 90 (L) 98 - 111  mmol/L   CO2 33 (H) 22 - 32 mmol/L   Glucose, Bld 217 (H) 70 - 99 mg/dL   BUN 12 8 - 23 mg/dL   Creatinine, Ser 1.23 (H) 0.44 - 1.00 mg/dL   Calcium 8.6 (L) 8.9 - 10.3 mg/dL   Total Protein 7.5 6.5 - 8.1 g/dL   Albumin 3.4 (L) 3.5 - 5.0 g/dL   AST 19 15 - 41 U/L   ALT 19 0 - 44 U/L   Alkaline Phosphatase 62 38 - 126 U/L   Total Bilirubin 0.6 0.3 - 1.2 mg/dL   GFR calc non Af Amer 44 (L) >60 mL/min   GFR calc Af Amer 51 (L) >60 mL/min   Anion gap 14 5 - 15  CBC with Differential  Result Value Ref Range   WBC 12.5 (H) 4.0 - 10.5 K/uL   RBC 3.83 (L) 3.87 - 5.11 MIL/uL   Hemoglobin 11.5 (L) 12.0 - 15.0 g/dL   HCT 33.7 (L) 36 - 46 %   MCV 88.0 80.0 - 100.0 fL   MCH 30.0 26.0 - 34.0 pg   MCHC 34.1 30.0 - 36.0 g/dL   RDW 13.1 11.5 - 15.5 %   Platelets 196 150 - 400 K/uL   nRBC 0.0 0.0 - 0.2 %   Neutrophils Relative % 87 %   Neutro Abs 10.7 (H) 1.7 - 7.7 K/uL   Lymphocytes Relative 7 %   Lymphs Abs 0.9 0.7 - 4.0 K/uL   Monocytes Relative 6 %   Monocytes Absolute 0.8 0 - 1 K/uL   Eosinophils Relative 0 %   Eosinophils Absolute 0.0 0 - 0 K/uL   Basophils Relative 0 %   Basophils Absolute 0.0 0 - 0 K/uL   Immature Granulocytes 0 %   Abs Immature Granulocytes 0.05 0.00 - 0.07 K/uL  Lactic acid, plasma  Result Value Ref Range   Lactic Acid, Venous 1.5 0.5 - 1.9 mmol/L  Lactic acid, plasma  Result Value Ref Range   Lactic Acid, Venous 1.3 0.5 - 1.9 mmol/L  Basic metabolic panel  Result Value Ref Range   Sodium 139 135 - 145 mmol/L   Potassium 3.3 (L) 3.5 - 5.1 mmol/L   Chloride 97 (L) 98 - 111 mmol/L   CO2 32 22 - 32 mmol/L   Glucose, Bld 220 (H) 70 - 99 mg/dL   BUN 12 8 - 23 mg/dL   Creatinine, Ser 0.97 0.44 - 1.00 mg/dL   Calcium 7.9 (L) 8.9 - 10.3 mg/dL   GFR calc non Af Amer 59 (L) >60 mL/min   GFR calc Af Amer >60 >60 mL/min   Anion gap 10 5 - 15     Assessment/ Plan: [name] was seen today for fatigue.  Diagnoses and all orders for this visit:  Case was  discussed in detail with Dr. Claretta Fraise for consultation regarding orders and medications.   Muscle weakness Labs pending as below to assess  for electrolyte abnormalities. Will address potassium supplement pending results. -     CMP14+EGFR -     CBC with Differential  Intractable vomiting with nausea, unspecified vomiting type Rule out pancreatitis given nausea, vomiting, diarrhea.  Increase fluid intake. Can continue zofran as needed. Eat small meals as needed.  -     Amylase -     Lipase -     CBC with Differential  Diarrhea, unspecified type Increase fluid intake.  -     Amylase -     Lipase -     CBC with Differential  History of low potassium Decrease lasix dosage to 20 mg daily to decrease loss of potassium, start potassium sparing diuretic. Changes made in agreement with Dr. Livia Snellen given patients recent history of significant hypokalemia. Return in 4 weeks, and will reassess at that time. -     spironolactone (ALDACTONE) 25 MG tablet; Take 1 tablet (25 mg total) by mouth daily. -     furosemide (LASIX) 20 MG tablet; TAKE ONE (1) TABLET EACH DAY   The above assessment and management plan was discussed with the patient. The patient verbalized understanding of and has agreed to the management plan. Patient is aware to call the clinic if symptoms persist or worsen. Patient is aware when to return to the clinic for a follow-up visit.   Marjorie Smolder, FNP-C [facility] [address] [place], [place] [address] [phone]

## 2020-05-31 NOTE — Patient Instructions (Signed)
Lab work pending. Will notify with results. Follow up in 1 month. Eat diet rich in high potassium foods. Stay hydrated. Return for worsening of symptoms or inability to keep foods or liquids down.    Potassium Content of Foods  Potassium is a mineral found in many foods and drinks. It affects how the heart works, and helps keep fluids and minerals balanced in the body. The amount of potassium you need each day depends on your age and any medical conditions you may have. Talk to your health care provider or dietitian about how much potassium you need. The following lists of foods provide the general serving size for foods and the approximate amount of potassium in each serving, listed in milligrams (mg). Actual values may vary depending on the product and how it is processed. High in potassium The following foods and beverages have 200 mg or more of potassium per serving:  Apricots (raw) - 2 have 200 mg of potassium.  Apricots (dry) - 5 have 200 mg of potassium.  Artichoke - 1 medium has 345 mg of potassium.  Avocado -  fruit has 245 mg of potassium.  Banana - 1 medium fruit has 425 mg of potassium.  [place] or baked beans (canned) -  cup has 280 mg of potassium.  White beans (canned) -  cup has 595 mg potassium.  Beef roast - 3 oz has 320 mg of potassium.  Ground beef - 3 oz has 270 mg of potassium.  Beets (raw or cooked) -  cup has 260 mg of potassium.  Bran muffin - 2 oz has 300 mg of potassium.  Broccoli (cooked) -  cup has 230 mg of potassium.  Brussels sprouts -  cup has 250 mg of potassium.  Cantaloupe -  cup has 215 mg of potassium.  Cereal, 100% bran -  cup has 200-400 mg of potassium.  Cheeseburger -1 single fast food burger has 225-400 mg of potassium.  Chicken - 3 oz has 220 mg of potassium.  Clams (canned) - 3 oz has 535 mg of potassium.  Crab - 3 oz has 225 mg of potassium.  Dates - 5 have 270 mg of potassium.  Dried beans and peas -  cup has  300-475 mg of potassium.  Figs (dried) - 2 have 260 mg of potassium.  Fish (halibut, tuna, cod, snapper) - 3 oz has 480 mg of potassium.  Fish (salmon, haddock, swordfish, perch) - 3 oz has 300 mg of potassium.  Fish (tuna, canned) - 3 oz has 200 mg of potassium.  [place] fries (fast food) - 3 oz has 470 mg of potassium.  Granola with fruit and nuts -  cup has 200 mg of potassium.  Grapefruit juice -  cup has 200 mg of potassium.  Honeydew melon -  cup has 200 mg of potassium.  Kale (raw) - 1 cup has 300 mg of potassium.  Kiwi - 1 medium fruit has 240 mg of potassium.  Kohlrabi, rutabaga, parsnips -  cup has 280 mg of potassium.  Lentils -  cup has 365 mg of potassium.  Mango - 1 each has 325 mg of potassium.  Milk (nonfat, low-fat, whole, buttermilk) - 1 cup has 350-380 mg of potassium.  Milk (chocolate) - 1 cup has 420 mg of potassium  Molasses - 1 Tbsp has 295 mg of potassium.  Mushrooms -  cup has 280 mg of potassium.  Nectarine - 1 each has 275 mg of potassium.  Nuts (almonds, peanuts,  hazelnuts, [place], cashew, mixed) - 1 oz has 200 mg of potassium.  Nuts (pistachios) - 1 oz has 295 mg of potassium.  Orange - 1 fruit has 240 mg of potassium.  Orange juice -  cup has 235 mg of potassium.  Papaya -  medium fruit has 390 mg of potassium.  Peanut butter (chunky) - 2 Tbsp has 240 mg of potassium.  Peanut butter (smooth) - 2 Tbsp has 210 mg of potassium.  Pear - 1 medium (200 mg) of potassium.  Pomegranate - 1 whole fruit has 400 mg of potassium.  Pomegranate juice -  cup has 215 mg of potassium.  Pork - 3 oz has 350 mg of potassium.  Potato chips (salted) - 1 oz has 465 mg of potassium.  Potato (baked with skin) - 1 medium has 925 mg of potassium.  Potato (boiled) -  cup has 255 mg of potassium.  Potato (Mashed) -  cup has 330 mg of potassium.  Prune juice -  cup has 370 mg of potassium.  Prunes - 5 have 305 mg of potassium.  Pudding  (chocolate) -  cup has 230 mg of potassium.  Pumpkin (canned) -  cup has 250 mg of potassium.  Raisins (seedless) -  cup has 270 mg of potassium.  Seeds (sunflower or pumpkin) - 1 oz has 240 mg of potassium.  Soy milk - 1 cup has 300 mg of potassium.  Spinach (cooked) - 1/2 cup has 420 mg of potassium.  Spinach (canned) -  cup has 370 mg of potassium.  Sweet potato (baked with skin) - 1 medium has 450 mg of potassium.  Swiss chard -  cup has 480 mg of potassium.  Tomato or vegetable juice -  cup has 275 mg of potassium.  Tomato (sauce or puree) -  cup has 400-550 mg of potassium.  Tomato (raw) - 1 medium has 290 mg of potassium.  Tomato (canned) -  cup has 200-300 mg of potassium.  [place] - 3 oz has 250 mg of potassium.  Wheat germ - 1 oz has 250 mg of potassium.  Winter squash -  cup has 250 mg of potassium.  Yogurt (plain or fruited) - 6 oz has 260-435 mg of potassium.  Zucchini -  cup has 220 mg of potassium. Moderate in potassium The following foods and beverages have 50-200 mg of potassium per serving:  Apple - 1 fruit has 150 mg of potassium  Apple juice -  cup has 150 mg of potassium  Applesauce -  cup has 90 mg of potassium  Apricot nectar -  cup has 140 mg of potassium  Asparagus (small spears) -  cup has 155 mg of potassium  Asparagus (large spears) - 6 have 155 mg of potassium  Bagel (cinnamon raisin) - 1 four-inch bagel has 130 mg of potassium  Bagel (egg or plain) - 1 four- inch bagel has 70 mg of potassium  Beans (green) -  cup has 90 mg of potassium  Beans (yellow) -  cup has 190 mg of potassium  Beer, regular - 12 oz has 100 mg of potassium  Beets (canned) -  cup has 125 mg of potassium  Blackberries -  cup has 115 mg of potassium  Blueberries -  cup has 60 mg of potassium  Bread (whole wheat) - 1 slice has 70 mg of potassium  Broccoli (raw) -  cup has 145 mg of potassium  Cabbage -  cup has 150 mg of  potassium  Carrots (cooked or raw) -  cup has 180 mg of potassium  Cauliflower (raw) -  cup has 150 mg of potassium  Celery (raw) -  cup has 155 mg of potassium  Cereal, bran flakes -  cup has 120-150 mg of potassium  Cheese (cottage) -  cup has 110 mg of potassium  Cherries - 10 have 150 mg of potassium  Chocolate - 1 oz bar has 165 mg of potassium  Coffee (brewed) - 6 oz has 90 mg of potassium  Corn -  cup or 1 ear has 195 mg of potassium  Cucumbers -  cup has 80 mg of potassium  Egg - 1 large egg has 60 mg of potassium  Eggplant -  cup has 60 mg of potassium  Endive (raw) -  cup has 80 mg of potassium  English muffin - 1 has 65 mg of potassium  Fish (ocean perch) - 3 oz has 192 mg of potassium  Frankfurter, beef or pork - 1 has 75 mg of potassium  Fruit cocktail -  cup has 115 mg of potassium  Grape juice -  cup has 170 mg of potassium  Grapefruit -  fruit has 175 mg of potassium  Grapes -  cup has 155 mg of potassium  Greens: kale, turnip, collard -  cup has 110-150 mg of potassium  Ice cream or frozen yogurt (chocolate) -  cup has 175 mg of potassium  Ice cream or frozen yogurt (vanilla) -  cup has 120-150 mg of potassium  Lemons, limes - 1 each has 80 mg of potassium  Lettuce - 1 cup has 100 mg of potassium  Mixed vegetables -  cup has 150 mg of potassium  Mushrooms, raw -  cup has 110 mg of potassium  Nuts (walnuts, pecans, or macadamia) - 1 oz has 125 mg of potassium  Oatmeal -  cup has 80 mg of potassium  Okra -  cup has 110 mg of potassium  Onions -  cup has 120 mg of potassium  Peach - 1 has 185 mg of potassium  Peaches (canned) -  cup has 120 mg of potassium  Pears (canned) -  cup has 120 mg of potassium  Peas, green (frozen) -  cup has 90 mg of potassium  Peppers (Green) -  cup has 130 mg of potassium  Peppers (Red) -  cup has 160 mg of potassium  Pineapple juice -  cup has 165 mg of  potassium  Pineapple (fresh or canned) -  cup has 100 mg of potassium  Plums - 1 has 105 mg of potassium  Pudding, vanilla -  cup has 150 mg of potassium  Raspberries -  cup has 90 mg of potassium  Rhubarb -  cup has 115 mg of potassium  Rice, wild -  cup has 80 mg of potassium  Shrimp - 3 oz has 155 mg of potassium  Spinach (raw) - 1 cup has 170 mg of potassium  Strawberries -  cup has 125 mg of potassium  Summer squash -  cup has 175-200 mg of potassium  Swiss chard (raw) - 1 cup has 135 mg of potassium  Tangerines - 1 fruit has 140 mg of potassium  Tea, brewed - 6 oz has 65 mg of potassium  Turnips -  cup has 140 mg of potassium  Watermelon -  cup has 85 mg of potassium  Wine (Red, table) - 5 oz has 180 mg of potassium  Wine (White, table) - 5 oz 100 mg of potassium Low in potassium The following foods and beverages have less than 50 mg of potassium per serving.  Bread (white) - 1 slice has 30 mg of potassium  Carbonated beverages - 12 oz has less than 5 mg of potassium  Cheese - 1 oz has 20-30 mg of potassium  Cranberries -  cup has 45 mg of potassium  Cranberry juice cocktail -  cup has 20 mg of potassium  Fats and oils - 1 Tbsp has less than 5 mg of potassium  Hummus - 1 Tbsp has 32 mg of potassium  Nectar (papaya, mango, or pear) -  cup has 35 mg of potassium  Rice (white or [place]) -  cup has 50 mg of potassium  Spaghetti or macaroni (cooked) -  cup has 30 mg of potassium  [place]illa, flour or corn - 1 has 50 mg of potassium  Waffle - 1 four-inch waffle has 50 mg of potassium  Water chestnuts -  cup has 40 mg of potassium Summary  Potassium is a mineral found in many foods and drinks. It affects how the heart works, and helps keep fluids and minerals balanced in the body.  The amount of potassium you need each day depends on your age and any existing medical conditions you may have. Your health care provider or dietitian may  recommend an amount of potassium that you should have each day. This information is not intended to replace advice given to you by your health care provider. Make sure you discuss any questions you have with your health care provider. Document Revised: 08/23/2017 Document Reviewed: 12/05/2016 Elsevier Patient Education  [place].

## 2020-05-31 NOTE — Telephone Encounter (Signed)
I am assuming that this is because she cannot get an appointment with me, I would be okay with her switching to [facility] as long as Colletta [place] reviews the case and feels comfortable with this switch.

## 2020-06-01 LAB — CMP14+EGFR
ALT: 13 IU/L (ref 0–32)
AST: 15 IU/L (ref 0–40)
Albumin/Globulin Ratio: 1.1 — ABNORMAL LOW (ref 1.2–2.2)
Albumin: 3.4 g/dL — ABNORMAL LOW (ref 3.7–4.7)
Alkaline Phosphatase: 69 IU/L (ref 48–121)
BUN/Creatinine Ratio: 10 — ABNORMAL LOW (ref 12–28)
BUN: 10 mg/dL (ref 8–27)
Bilirubin Total: 0.4 mg/dL (ref 0.0–1.2)
CO2: 26 mmol/L (ref 20–29)
Calcium: 9.1 mg/dL (ref 8.7–10.3)
Chloride: 100 mmol/L (ref 96–106)
Creatinine, Ser: 0.99 mg/dL (ref 0.57–1.00)
GFR calc Af Amer: 66 mL/min/{1.73_m2} (ref >59)
GFR calc non Af Amer: 58 mL/min/{1.73_m2} — ABNORMAL LOW (ref >59)
Globulin, Total: 3.2 g/dL (ref 1.5–4.5)
Glucose: 276 mg/dL — ABNORMAL HIGH (ref 65–99)
Potassium: 4 mmol/L (ref 3.5–5.2)
Sodium: 142 mmol/L (ref 134–144)
Total Protein: 6.6 g/dL (ref 6.0–8.5)

## 2020-06-01 LAB — CBC WITH DIFFERENTIAL/PLATELET
Basophils Absolute: 0 10*3/uL (ref 0.0–0.2)
Basos: 0 %
EOS (ABSOLUTE): 0.1 10*3/uL (ref 0.0–0.4)
Eos: 1 %
Hematocrit: 31.2 % — ABNORMAL LOW (ref 34.0–46.6)
Hemoglobin: 10.5 g/dL — ABNORMAL LOW (ref 11.1–15.9)
Immature Grans (Abs): 0 10*3/uL (ref 0.0–0.1)
Immature Granulocytes: 0 %
Lymphocytes Absolute: 1.9 10*3/uL (ref 0.7–3.1)
Lymphs: 25 %
MCH: 29 pg (ref 26.6–33.0)
MCHC: 33.7 g/dL (ref 31.5–35.7)
MCV: 86 fL (ref 79–97)
Monocytes Absolute: 0.5 10*3/uL (ref 0.1–0.9)
Monocytes: 7 %
Neutrophils Absolute: 5.2 10*3/uL (ref 1.4–7.0)
Neutrophils: 67 %
Platelets: 197 10*3/uL (ref 150–450)
RBC: 3.62 x10E6/uL — ABNORMAL LOW (ref 3.77–5.28)
RDW: 13.9 % (ref 11.7–15.4)
WBC: 7.7 10*3/uL (ref 3.4–10.8)

## 2020-06-01 LAB — AMYLASE: Amylase: 28 U/L — ABNORMAL LOW (ref 31–110)

## 2020-06-01 LAB — LIPASE: Lipase: 9 U/L — ABNORMAL LOW (ref 14–85)

## 2020-06-01 NOTE — Addendum Note (Signed)
Addended by: Gwenlyn Perking on: 06/01/2020 08:37 AM   Modules accepted: Orders

## 2020-06-01 NOTE — Telephone Encounter (Signed)
Changed patient aware

## 2020-06-01 NOTE — Telephone Encounter (Signed)
This is fine with me   

## 2020-06-02 ENCOUNTER — Ambulatory Visit: Payer: PPO | Admitting: Family Medicine

## 2020-06-02 ENCOUNTER — Other Ambulatory Visit: Payer: PPO

## 2020-06-02 ENCOUNTER — Other Ambulatory Visit: Payer: Self-pay

## 2020-06-02 DIAGNOSIS — Z1211 Encounter for screening for malignant neoplasm of colon: Secondary | ICD-10-CM | POA: Diagnosis not present

## 2020-06-02 LAB — HGB A1C W/O EAG: Hgb A1c MFr Bld: 7.5 % — ABNORMAL HIGH (ref 4.8–5.6)

## 2020-06-02 LAB — SPECIMEN STATUS REPORT

## 2020-06-02 NOTE — Addendum Note (Signed)
Addended by: Earlene Plater on: 06/02/2020 03:38 PM   Modules accepted: Orders

## 2020-06-03 LAB — FECAL OCCULT BLOOD, IMMUNOCHEMICAL: Fecal Occult Bld: NEGATIVE

## 2020-06-06 ENCOUNTER — Telehealth: Payer: Self-pay | Admitting: Family Medicine

## 2020-06-06 NOTE — Telephone Encounter (Signed)
Patient aware and verbalized understanding. °

## 2020-06-08 ENCOUNTER — Other Ambulatory Visit: Payer: Self-pay | Admitting: Family Medicine

## 2020-06-23 NOTE — Chronic Care Management (AMB) (Signed)
  Chronic Care Management   Note  08/19/2019 Name: [name] MRN: [id number] DOB: 1951-02-06  Assisted [facility] Triage nurse with downloading and printing freestyle [place] sensor data. Provided printout to PCP for review and remarks.    Follow up plan: The care management team will reach out to the patient again over the next 60 days.    Chong Sicilian, BSN, RN-BC Embedded Chronic Care Manager [facility] [place] [facility] Direct Dial: [phone]

## 2020-07-04 ENCOUNTER — Ambulatory Visit: Payer: PPO | Admitting: Family Medicine

## 2020-07-12 ENCOUNTER — Other Ambulatory Visit: Payer: Self-pay

## 2020-07-12 ENCOUNTER — Encounter: Payer: Self-pay | Admitting: Family Medicine

## 2020-07-12 ENCOUNTER — Ambulatory Visit (INDEPENDENT_AMBULATORY_CARE_PROVIDER_SITE_OTHER): Payer: PPO | Admitting: Family Medicine

## 2020-07-12 ENCOUNTER — Ambulatory Visit (INDEPENDENT_AMBULATORY_CARE_PROVIDER_SITE_OTHER): Payer: PPO

## 2020-07-12 ENCOUNTER — Telehealth: Payer: Self-pay

## 2020-07-12 VITALS — BP 126/77 | HR 86 | Temp 98.2°F | Resp 20 | Ht 66.0 in | Wt 164.1 lb

## 2020-07-12 DIAGNOSIS — K5900 Constipation, unspecified: Secondary | ICD-10-CM | POA: Diagnosis not present

## 2020-07-12 DIAGNOSIS — D649 Anemia, unspecified: Secondary | ICD-10-CM

## 2020-07-12 DIAGNOSIS — Z780 Asymptomatic menopausal state: Secondary | ICD-10-CM

## 2020-07-12 DIAGNOSIS — Z9189 Other specified personal risk factors, not elsewhere classified: Secondary | ICD-10-CM | POA: Diagnosis not present

## 2020-07-12 DIAGNOSIS — N3000 Acute cystitis without hematuria: Secondary | ICD-10-CM | POA: Diagnosis not present

## 2020-07-12 DIAGNOSIS — R112 Nausea with vomiting, unspecified: Secondary | ICD-10-CM | POA: Diagnosis not present

## 2020-07-12 DIAGNOSIS — R300 Dysuria: Secondary | ICD-10-CM | POA: Diagnosis not present

## 2020-07-12 LAB — MICROSCOPIC EXAMINATION
RBC, Urine: NONE SEEN /hpf (ref 0–2)
WBC, UA: >30 /hpf — ABNORMAL HIGH (ref 0–5)

## 2020-07-12 LAB — URINALYSIS, COMPLETE
Bilirubin, UA: NEGATIVE
Glucose, UA: NEGATIVE
Ketones, UA: NEGATIVE
Nitrite, UA: POSITIVE — AB
Specific Gravity, UA: 1.01 (ref 1.005–1.030)
Urobilinogen, Ur: 2 mg/dL — ABNORMAL HIGH (ref 0.2–1.0)
pH, UA: 6 (ref 5.0–7.5)

## 2020-07-12 MED ORDER — ONDANSETRON HCL 8 MG PO TABS: 4.0000 mg | ORAL_TABLET | Freq: Four times a day (QID) | ORAL | 0 refills | Status: DC | PRN

## 2020-07-12 MED ORDER — SULFAMETHOXAZOLE-TRIMETHOPRIM 800-160 MG PO TABS: 1.0000 | ORAL_TABLET | Freq: Two times a day (BID) | ORAL | 0 refills | Status: AC

## 2020-07-12 NOTE — Progress Notes (Signed)
Subjective: CC: nausea, vomiting follow up PCP: Gwenlyn Perking, FNP  [id number] D Jodean Valade is a 69 y.o. female presenting to clinic today for:  1. Nausea, vomiting follow up [name] was having diarrhea with nausea and vomiting that started about 2 months ago. Shortly after she started having constipation to the point of having to disimpact herself every couple days. She tried stool softeners, laxatives, and enemas without improvement until recently. Last week she was able to have good daily BMs, but has not had a BM in 4 days now. Denies blood in her stool. She also reports nausea and vomiting that occurs every 3-4 days. She is taking zofran 4 mg without much improvement. Denies bilious vomiting. She reports a poor appetite and eats only to keep up her blood sugars. She denies lightheadedness or syncope. She reports pain in her RLQ that is a dull ache. The pain comes and goes and is moderate in intensity when it occurs. She has has had her gallbladder and appendix removed. She has also had a partial hysterectomy. She has had a colonoscopy in 2013 with the next one due in 2023. She does report black tarry stools since starting an iron supplement about 1 month ago. She had a negative fecal occult blood just prior. She would like to see GI for her symptoms. She previously saw Dr. Gala Romney for her colonoscopy.   2. Dysuria [name] reports dysuria for about 3 days. She denies fever, flank pain, frequency, pelvic pain, or hematuria.   Her fasting blood sugars have been around 145.  Relevant past medical, surgical, family, and social history reviewed and updated as indicated.  Allergies and medications reviewed and updated.  Allergies  Allergen Reactions  . Iohexol      Desc: pt had syncopal episode with nausea post IV CM late 1990's,  pt has had prednisone prep with heart caths x 2 without problem  kdean 04/16/07, Onset Date: [id number]   . Ticlid [Ticlopidine Hcl] Nausea And Vomiting  . Codeine  Nausea And Vomiting and Palpitations   Past Medical History:  Diagnosis Date  . Anxiety   . CAD (coronary artery disease)    DES to circumflex 02/2007, BMS to LAD and PTCA diagonal 03/2007  . Carotid artery plaque    Mild  . Cataract   . Depression   . Diverticulitis, colon   . Elevated d-dimer 01/08/2014  . Essential hypertension, benign   . GERD (gastroesophageal reflux disease)   . H/O hiatal hernia   . HLD (hyperlipidemia)   . IDDM (insulin dependent diabetes mellitus)   . Migraine    "used to have them really bad; don't have them anymore" (01/07/2014)  . MS (multiple sclerosis) ([place])    Not confirmed  . PAT (paroxysmal atrial tachycardia) ([place])   . Prolapse of uterus   . PVD (peripheral vascular disease) ([place])   . TIA (transient ischemic attack) 1980's    Current Outpatient Medications:  .  amLODipine (NORVASC) 5 MG tablet, TAKE ONE (1) TABLET EACH DAY, Disp: 90 tablet, Rfl: 0 .  aspirin 325 MG tablet, Take 325 mg by mouth daily., Disp: , Rfl:  .  atenolol (TENORMIN) 50 MG tablet, Take 1 tablet (50 mg total) by mouth 2 (two) times daily., Disp: 180 tablet, Rfl: 3 .  citalopram (CELEXA) 40 MG tablet, TAKE ONE (1) TABLET EACH DAY, Disp: 90 tablet, Rfl: 3 .  clonazePAM (KLONOPIN) 0.5 MG tablet, Take 1 tablet (0.5 mg total) by mouth at bedtime as  needed for anxiety., Disp: 30 tablet, Rfl: 2 .  Continuous Blood Gluc Sensor (FREESTYLE LIBRE 14 DAY SENSOR) MISC, USE TO CHECK BLOOD GLUCOSE, Disp: 8 each, Rfl: 3 .  empagliflozin (JARDIANCE) 10 MG TABS tablet, Take 10 mg by mouth daily., Disp: 30 tablet, Rfl: 3 .  esomeprazole (NEXIUM) 40 MG capsule, Take 1 capsule (40 mg total) by mouth daily., Disp: 30 capsule, Rfl: 3 .  furosemide (LASIX) 20 MG tablet, TAKE ONE (1) TABLET EACH DAY, Disp: 30 tablet, Rfl: 0 .  isosorbide mononitrate (IMDUR) 120 MG 24 hr tablet, TAKE ONE (1) TABLET EACH DAY, Disp: 90 tablet, Rfl: 1 .  lisinopril (ZESTRIL) 40 MG tablet, Take 1 tablet (40 mg total) by  mouth daily., Disp: 90 tablet, Rfl: 3 .  meclizine (ANTIVERT) 25 MG tablet, Take 1 tablet (25 mg total) by mouth 3 (three) times daily as needed for dizziness., Disp: 90 tablet, Rfl: 3 .  Misc. Devices (WALL GRAB BAR) MISC, For bathtub/shower dx: M17.0, 173.9, Z91.8, Disp: 1 each, Rfl: 0 .  nitroGLYCERIN (NITROSTAT) 0.4 MG SL tablet, DISSOLVE 1 TABLET UNDER TONGUE FOR CHESTPAIN.MAY REPEAT EVERY 5 MINUTES FOR 3 DOSES.IF NO RELIEF CALL 911 OR GO TO ER, Disp: 25 tablet, Rfl: 6 .  NOVOLOG FLEXPEN 100 UNIT/ML FlexPen, INJECT 35-40 UNITS SQ 3 TIMES DAILY WITH MEALS (Patient taking differently: 25 Units. ), Disp: 30 mL, Rfl: 3 .  ondansetron (ZOFRAN) 4 MG tablet, Take 1 tablet (4 mg total) by mouth every 6 (six) hours as needed., Disp: 30 tablet, Rfl: 0 .  potassium chloride SA (K-DUR) 20 MEQ tablet, Take 2 tablets (40 mEq total) by mouth daily., Disp: 180 tablet, Rfl: 3 .  rOPINIRole (REQUIP) 0.5 MG tablet, Take 1 tablet (0.5 mg total) by mouth at bedtime., Disp: 90 tablet, Rfl: 3 .  rosuvastatin (CRESTOR) 40 MG tablet, TAKE ONE (1) TABLET EACH DAY, Disp: 90 tablet, Rfl: 3 .  Semaglutide,0.25 or 0.5MG/DOS, (OZEMPIC, 0.25 OR 0.5 MG/DOSE,) 2 MG/1.5ML SOPN, Inject 0.375 mLs (0.5 mg total) into the skin once a week., Disp: 1.5 mL, Rfl: 11 .  TOUJEO SOLOSTAR 300 UNIT/ML SOPN, INJECT 50-60 UNITS SQ DAILY, Disp: 9 mL, Rfl: 2 .  vitamin B-12 (CYANOCOBALAMIN) 1000 MCG tablet, Take 1,000 mcg by mouth daily., Disp: , Rfl:  .  Vitamin D, Ergocalciferol, (DRISDOL) 1.25 MG (50000 UT) CAPS capsule, TAKE 1 CAPSULE EVERY 7 DAYS, Disp: 12 capsule, Rfl: 3 .  spironolactone (ALDACTONE) 25 MG tablet, Take 1 tablet (25 mg total) by mouth daily., Disp: 30 tablet, Rfl: 0  Current Facility-Administered Medications:  .  methylPREDNISolone acetate (DEPO-MEDROL) injection 80 mg, 80 mg, Intramuscular, Once, Dettinger, Fransisca Kaufmann, MD Social History   Socioeconomic History  . Marital status: Widowed    Spouse name: Not on file  .  Number of children: 4  . Years of education: 85  . Highest education level: 11th grade  Occupational History  . Occupation: Disability    Employer: DISABLED  Tobacco Use  . Smoking status: Never Smoker  . Smokeless tobacco: Never Used  . Tobacco comment: spouse, 3 years - husband has quit 01/2011  Vaping Use  . Vaping Use: Never used  Substance and Sexual Activity  . Alcohol use: No  . Drug use: No  . Sexual activity: Not Currently  Other Topics Concern  . Not on file  Social History Narrative  . Not on file   Social Determinants of Health   Financial Resource Strain:   . Difficulty  of Paying Living Expenses: Not on file  Food Insecurity:   . Worried About Charity fundraiser in the Last Year: Not on file  . Ran Out of Food in the Last Year: Not on file  Transportation Needs:   . Lack of Transportation (Medical): Not on file  . Lack of Transportation (Non-Medical): Not on file  Physical Activity:   . Days of Exercise per Week: Not on file  . Minutes of Exercise per Session: Not on file  Stress:   . Feeling of Stress : Not on file  Social Connections:   . Frequency of Communication with Friends and Family: Not on file  . Frequency of Social Gatherings with Friends and Family: Not on file  . Attends Religious Services: Not on file  . Active Member of Clubs or Organizations: Not on file  . Attends Archivist Meetings: Not on file  . Marital Status: Not on file  Intimate Partner Violence:   . Fear of Current or Ex-Partner: Not on file  . Emotionally Abused: Not on file  . Physically Abused: Not on file  . Sexually Abused: Not on file   Family History  Problem Relation Age of Onset  . Heart attack Mother 100  . Diabetes Mother   . Hypertension Mother   . Heart attack Father 69  . Heart attack Brother 32       x 6  . Heart disease Brother   . Diabetes Brother   . Colon cancer Paternal Aunt        49s, died with brain anuerysm  . Crohn's disease Cousin         paternal  . Diabetes Sister   . GER disease Daughter   . Cervical cancer Daughter   . Diabetes Daughter     Review of Systems  Per HPI.   Objective: Office vital signs reviewed. BP 126/77   Pulse 86   Temp 98.2 F (36.8 C) (Temporal)   Resp 20   Ht 5' 6"  (1.676 m)   Wt 164 lb 2 oz (74.4 kg)   SpO2 97%   BMI 26.49 kg/m   Physical Examination:  Physical Exam Vitals and nursing note reviewed.  Constitutional:      General: She is not in acute distress.    Appearance: Normal appearance. She is not ill-appearing, toxic-appearing or diaphoretic.  Cardiovascular:     Rate and Rhythm: Normal rate and regular rhythm.     Heart sounds: Normal heart sounds. No murmur heard.   Pulmonary:     Effort: Pulmonary effort is normal. No respiratory distress.     Breath sounds: Normal breath sounds.  Abdominal:     General: Bowel sounds are normal. There is no distension.     Palpations: Abdomen is soft. There is no mass.     Tenderness: There is no abdominal tenderness. There is no right CVA tenderness, left CVA tenderness, guarding or rebound.     Hernia: No hernia is present.  Musculoskeletal:     Right lower leg: No edema.     Left lower leg: No edema.  Skin:    General: Skin is warm and dry.  Neurological:     General: No focal deficit present.     Mental Status: She is alert and oriented to person, place, and time.  Psychiatric:        Mood and Affect: Mood normal.        Behavior: Behavior normal.  Urine dipstick shows positive for RBC's, positive for protein, positive for nitrates and positive for leukocytes.  Micro exam: >30 WBC's per HPF, no RBCs, 0-10 epithelial cells, no renal epithelial cells, many bacteria, no yeast.   Results for orders placed or performed in visit on 05/31/20  Fecal occult blood, imunochemical   Specimen: Stool   ST  Result Value Ref Range   Fecal Occult Bld Negative Negative  CMP14+EGFR  Result Value Ref Range   Glucose 276 (H)  65 - 99 mg/dL   BUN 10 8 - 27 mg/dL   Creatinine, Ser 0.99 0.57 - 1.00 mg/dL   GFR calc non Af Amer 58 (L) >59 mL/min/1.73   GFR calc Af Amer 66 >59 mL/min/1.73   BUN/Creatinine Ratio 10 (L) 12 - 28   Sodium 142 134 - 144 mmol/L   Potassium 4.0 3.5 - 5.2 mmol/L   Chloride 100 96 - 106 mmol/L   CO2 26 20 - 29 mmol/L   Calcium 9.1 8.7 - 10.3 mg/dL   Total Protein 6.6 6.0 - 8.5 g/dL   Albumin 3.4 (L) 3.7 - 4.7 g/dL   Globulin, Total 3.2 1.5 - 4.5 g/dL   Albumin/Globulin Ratio 1.1 (L) 1.2 - 2.2   Bilirubin Total 0.4 0.0 - 1.2 mg/dL   Alkaline Phosphatase 69 48 - 121 IU/L   AST 15 0 - 40 IU/L   ALT 13 0 - 32 IU/L  Amylase  Result Value Ref Range   Amylase 28 (L) 31 - 110 U/L  Lipase  Result Value Ref Range   Lipase 9 (L) 14 - 85 U/L  CBC with Differential  Result Value Ref Range   WBC 7.7 3.4 - 10.8 x10E3/uL   RBC 3.62 (L) 3.77 - 5.28 x10E6/uL   Hemoglobin 10.5 (L) 11.1 - 15.9 g/dL   Hematocrit 31.2 (L) 34.0 - 46.6 %   MCV 86 79 - 97 fL   MCH 29.0 26.6 - 33.0 pg   MCHC 33.7 31 - 35 g/dL   RDW 13.9 11.7 - 15.4 %   Platelets 197 150 - 450 x10E3/uL   Neutrophils 67 Not Estab. %   Lymphs 25 Not Estab. %   Monocytes 7 Not Estab. %   Eos 1 Not Estab. %   Basos 0 Not Estab. %   Neutrophils Absolute 5.2 1.40 - 7.00 x10E3/uL   Lymphocytes Absolute 1.9 0 - 3 x10E3/uL   Monocytes Absolute 0.5 0 - 0 x10E3/uL   EOS (ABSOLUTE) 0.1 0.0 - 0.4 x10E3/uL   Basophils Absolute 0.0 0 - 0 x10E3/uL   Immature Granulocytes 0 Not Estab. %   Immature Grans (Abs) 0.0 0.0 - 0.1 x10E3/uL  Hgb A1c w/o eAG  Result Value Ref Range   Hgb A1c MFr Bld 7.5 (H) 4.8 - 5.6 %  Specimen status report  Result Value Ref Range   specimen status report Comment      Assessment/ Plan: [name] was seen today for one month follow up of nausea and vomiting.  Diagnoses and all orders for this visit:  Non-intractable vomiting with nausea, unspecified vomiting type Vitals stable today. CMP with liver function test  ordered. KUB today in office without indication of blockage, radiology report pending. Normal abdominal exam today. Zofran increased to 8 mg. Referral to GI.  -     CMP14+EGFR -     DG Abd 1 View -     Ambulatory referral to Gastroenterology -     ondansetron (ZOFRAN) 8 MG tablet;  Take 0.5 tablets (4 mg total) by mouth every 6 (six) hours as needed.  Constipation, unspecified constipation type KUB today in office without indication of blockage, radiology report pending. Normal abdominal exam today. Miralax daily. Discussed holding iron supplement for now as symptoms seemed to worsen after starting. Referral to GI.  -     DG Abd 1 View -     Ambulatory referral to Gastroenterology  Anemia, unspecified type Hold iron supplement for now as GI symptoms have worsened. Labs pending as below.  -     CBC with Differential  Dysuria Acute cystitis without hematuria No systemic symptoms today. Urine culture pending. Start bactrim.        -     Urinalysis, Complete -     Urine Culture -     sulfamethoxazole-trimethoprim (BACTRIM DS) 800-160 MG tablet; Take 1 tablet by mouth 2 (two) times daily for 5 days.  At risk for decreased bone density Dexa completed today.  -     DG [facility] DEXA; Future  Follow up in 2 months for chronic illnesses, sooner for new or worsening symptoms.   The above assessment and management plan was discussed with the patient. The patient verbalized understanding of and has agreed to the management plan. Patient is aware to call the clinic if symptoms persist or worsen. Patient is aware when to return to the clinic for a follow-up visit. Patient educated on when it is appropriate to go to the emergency department.   Marjorie Smolder, FNP-C [facility] [address] [place], [place] [address] [phone]

## 2020-07-12 NOTE — Patient Instructions (Signed)
Urinary Tract Infection, Adult  A urinary tract infection (UTI) is an infection of any part of the urinary tract. The urinary tract includes the kidneys, ureters, bladder, and urethra. These organs make, store, and get rid of urine in the body. Your health care provider may use other names to describe the infection. An upper UTI affects the ureters and kidneys (pyelonephritis). A lower UTI affects the bladder (cystitis) and urethra (urethritis). What are the causes? Most urinary tract infections are caused by bacteria in your genital area, around the entrance to your urinary tract (urethra). These bacteria grow and cause inflammation of your urinary tract. What increases the risk? You are more likely to develop this condition if:  You have a urinary catheter that stays in place (indwelling).  You are not able to control when you urinate or have a bowel movement (you have incontinence).  You are female and you: ? Use a spermicide or diaphragm for birth control. ? Have low estrogen levels. ? Are pregnant.  You have certain genes that increase your risk (genetics).  You are sexually active.  You take antibiotic medicines.  You have a condition that causes your flow of urine to slow down, such as: ? An enlarged prostate, if you are female. ? Blockage in your urethra (stricture). ? A kidney stone. ? A nerve condition that affects your bladder control (neurogenic bladder). ? Not getting enough to drink, or not urinating often.  You have certain medical conditions, such as: ? Diabetes. ? A weak disease-fighting system (immunesystem). ? Sickle cell disease. ? Gout. ? Spinal cord injury. What are the signs or symptoms? Symptoms of this condition include:  Needing to urinate right away (urgently).  Frequent urination or passing small amounts of urine frequently.  Pain or burning with urination.  Blood in the urine.  Urine that smells bad or unusual.  Trouble urinating.  Cloudy  urine.  Vaginal discharge, if you are female.  Pain in the abdomen or the lower back. You may also have:  Vomiting or a decreased appetite.  Confusion.  Irritability or tiredness.  A fever.  Diarrhea. The first symptom in older adults may be confusion. In some cases, they may not have any symptoms until the infection has worsened. How is this diagnosed? This condition is diagnosed based on your medical history and a physical exam. You may also have other tests, including:  Urine tests.  Blood tests.  Tests for sexually transmitted infections (STIs). If you have had more than one UTI, a cystoscopy or imaging studies may be done to determine the cause of the infections. How is this treated? Treatment for this condition includes:  Antibiotic medicine.  Over-the-counter medicines to treat discomfort.  Drinking enough water to stay hydrated. If you have frequent infections or have other conditions such as a kidney stone, you may need to see a health care provider who specializes in the urinary tract (urologist). In rare cases, urinary tract infections can cause sepsis. Sepsis is a life-threatening condition that occurs when the body responds to an infection. Sepsis is treated in the hospital with IV antibiotics, fluids, and other medicines. Follow these instructions at home:  Medicines  Take over-the-counter and prescription medicines only as told by your health care provider.  If you were prescribed an antibiotic medicine, take it as told by your health care provider. Do not stop using the antibiotic even if you start to feel better. General instructions  Make sure you: ? Empty your bladder often and   completely. Do not hold urine for long periods of time. ? Empty your bladder after sex. ? Wipe from front to back after a bowel movement if you are female. Use each tissue one time when you wipe.  Drink enough fluid to keep your urine pale yellow.  Keep all follow-up  visits as told by your health care provider. This is important. Contact a health care provider if:  Your symptoms do not get better after 1-2 days.  Your symptoms go away and then return. Get help right away if you have:  Severe pain in your back or your lower abdomen.  A fever.  Nausea or vomiting. Summary  A urinary tract infection (UTI) is an infection of any part of the urinary tract, which includes the kidneys, ureters, bladder, and urethra.  Most urinary tract infections are caused by bacteria in your genital area, around the entrance to your urinary tract (urethra).  Treatment for this condition often includes antibiotic medicines.  If you were prescribed an antibiotic medicine, take it as told by your health care provider. Do not stop using the antibiotic even if you start to feel better.  Keep all follow-up visits as told by your health care provider. This is important. This information is not intended to replace advice given to you by your health care provider. Make sure you discuss any questions you have with your health care provider. Document Revised: 08/28/2018 Document Reviewed: 03/20/2018 Elsevier Patient Education  [place]. Constipation, Adult Constipation is when a person has fewer bowel movements in a week than normal, has difficulty having a bowel movement, or has stools that are dry, hard, or larger than normal. Constipation may be caused by an underlying condition. It may become worse with age if a person takes certain medicines and does not take in enough fluids. Follow these instructions at home: Eating and drinking   Eat foods that have a lot of fiber, such as fresh fruits and vegetables, whole grains, and beans.  Limit foods that are high in fat, low in fiber, or overly processed, such as french fries, hamburgers, cookies, candies, and soda.  Drink enough fluid to keep your urine clear or pale yellow. General instructions  Exercise  regularly or as told by your health care provider.  Go to the restroom when you have the urge to go. Do not hold it in.  Take over-the-counter and prescription medicines only as told by your health care provider. These include any fiber supplements.  Practice pelvic floor retraining exercises, such as deep breathing while relaxing the lower abdomen and pelvic floor relaxation during bowel movements.  Watch your condition for any changes.  Keep all follow-up visits as told by your health care provider. This is important. Contact a health care provider if:  You have pain that gets worse.  You have a fever.  You do not have a bowel movement after 4 days.  You vomit.  You are not hungry.  You lose weight.  You are bleeding from the anus.  You have thin, pencil-like stools. Get help right away if:  You have a fever and your symptoms suddenly get worse.  You leak stool or have blood in your stool.  Your abdomen is bloated.  You have severe pain in your abdomen.  You feel dizzy or you faint. This information is not intended to replace advice given to you by your health care provider. Make sure you discuss any questions you have with your health care provider.  Document Revised: 08/23/2017 Document Reviewed: 02/29/2016 Elsevier Patient Education  2020 [employer].

## 2020-07-13 ENCOUNTER — Other Ambulatory Visit: Payer: PPO

## 2020-07-13 DIAGNOSIS — R112 Nausea with vomiting, unspecified: Secondary | ICD-10-CM | POA: Diagnosis not present

## 2020-07-13 DIAGNOSIS — D649 Anemia, unspecified: Secondary | ICD-10-CM | POA: Diagnosis not present

## 2020-07-14 ENCOUNTER — Telehealth: Payer: Self-pay

## 2020-07-14 DIAGNOSIS — M85852 Other specified disorders of bone density and structure, left thigh: Secondary | ICD-10-CM | POA: Diagnosis not present

## 2020-07-14 LAB — CMP14+EGFR
ALT: 7 IU/L (ref 0–32)
AST: 11 IU/L (ref 0–40)
Albumin/Globulin Ratio: 1.2 (ref 1.2–2.2)
Albumin: 3.8 g/dL (ref 3.7–4.7)
Alkaline Phosphatase: 66 IU/L (ref 44–121)
BUN/Creatinine Ratio: 7 — ABNORMAL LOW (ref 12–28)
BUN: 7 mg/dL — ABNORMAL LOW (ref 8–27)
Bilirubin Total: 0.3 mg/dL (ref 0.0–1.2)
CO2: 27 mmol/L (ref 20–29)
Calcium: 9.3 mg/dL (ref 8.7–10.3)
Chloride: 97 mmol/L (ref 96–106)
Creatinine, Ser: 0.99 mg/dL (ref 0.57–1.00)
GFR calc Af Amer: 66 mL/min/{1.73_m2} (ref >59)
GFR calc non Af Amer: 58 mL/min/{1.73_m2} — ABNORMAL LOW (ref >59)
Globulin, Total: 3.1 g/dL (ref 1.5–4.5)
Glucose: 293 mg/dL — ABNORMAL HIGH (ref 65–99)
Potassium: 4 mmol/L (ref 3.5–5.2)
Sodium: 138 mmol/L (ref 134–144)
Total Protein: 6.9 g/dL (ref 6.0–8.5)

## 2020-07-14 LAB — CBC WITH DIFFERENTIAL/PLATELET
Basophils Absolute: 0 10*3/uL (ref 0.0–0.2)
Basos: 0 %
EOS (ABSOLUTE): 0.2 10*3/uL (ref 0.0–0.4)
Eos: 2 %
Hematocrit: 33.6 % — ABNORMAL LOW (ref 34.0–46.6)
Hemoglobin: 10.9 g/dL — ABNORMAL LOW (ref 11.1–15.9)
Immature Grans (Abs): 0 10*3/uL (ref 0.0–0.1)
Immature Granulocytes: 0 %
Lymphocytes Absolute: 2 10*3/uL (ref 0.7–3.1)
Lymphs: 29 %
MCH: 28.5 pg (ref 26.6–33.0)
MCHC: 32.4 g/dL (ref 31.5–35.7)
MCV: 88 fL (ref 79–97)
Monocytes Absolute: 0.4 10*3/uL (ref 0.1–0.9)
Monocytes: 6 %
Neutrophils Absolute: 4.3 10*3/uL (ref 1.4–7.0)
Neutrophils: 63 %
Platelets: 233 10*3/uL (ref 150–450)
RBC: 3.83 x10E6/uL (ref 3.77–5.28)
RDW: 14.4 % (ref 11.7–15.4)
WBC: 7 10*3/uL (ref 3.4–10.8)

## 2020-07-14 NOTE — Telephone Encounter (Signed)
Patient aware of lab results.

## 2020-07-16 LAB — URINE CULTURE

## 2020-07-19 ENCOUNTER — Other Ambulatory Visit: Payer: Self-pay | Admitting: Family Medicine

## 2020-07-19 DIAGNOSIS — M8580 Other specified disorders of bone density and structure, unspecified site: Secondary | ICD-10-CM

## 2020-07-19 MED ORDER — VITAMIN D (ERGOCALCIFEROL) 1.25 MG (50000 UNIT) PO CAPS: ORAL_CAPSULE | ORAL | 3 refills | Status: DC

## 2020-07-19 MED ORDER — CALCIUM CARBONATE 600 MG PO TABS: 600.0000 mg | ORAL_TABLET | Freq: Two times a day (BID) | ORAL | 3 refills | Status: DC

## 2020-07-19 NOTE — Progress Notes (Unsigned)
c 

## 2020-07-20 ENCOUNTER — Encounter: Payer: Self-pay | Admitting: Internal Medicine

## 2020-08-02 ENCOUNTER — Ambulatory Visit: Payer: PPO | Admitting: Gastroenterology

## 2020-09-07 ENCOUNTER — Ambulatory Visit: Payer: PPO | Admitting: Gastroenterology

## 2020-09-09 ENCOUNTER — Ambulatory Visit (INDEPENDENT_AMBULATORY_CARE_PROVIDER_SITE_OTHER): Payer: PPO | Admitting: Nurse Practitioner

## 2020-09-09 DIAGNOSIS — J00 Acute nasopharyngitis [common cold]: Secondary | ICD-10-CM | POA: Diagnosis not present

## 2020-09-09 DIAGNOSIS — Z20822 Contact with and (suspected) exposure to covid-19: Secondary | ICD-10-CM

## 2020-09-09 NOTE — Progress Notes (Addendum)
Virtual Visit via telephone Note Due to COVID-19 pandemic this visit was conducted virtually. This visit type was conducted due to national recommendations for restrictions regarding the COVID-19 Pandemic (e.g. social distancing, sheltering in place) in an effort to limit this patient's exposure and mitigate transmission in our community. All issues noted in this document were discussed and addressed.  A physical exam was not performed with this format.  I connected with [name] on 09/09/20 at 12:45 by telephone and verified that I am speaking with the correct person using two identifiers. [name] is currently located at home and her husband is currently with her during visit. The provider, Mary-Margaret Hassell Done, FNP is located in their office at time of visit.  I discussed the limitations, risks, security and privacy concerns of performing an evaluation and management service by telephone and the availability of in person appointments. I also discussed with the patient that there may be a patient responsible charge related to this service. The patient expressed understanding and agreed to proceed.   History and Present Illness:   Chief Complaint: URI   HPI Patient calls in stating that her sister and nieces and nephews have all tested positive for covid. Patient says she has bad headache with no appetite. Coughing and congestion. Says she can still taste and smell.She wants to be tested. She has had 2 vaccines.    Review of Systems  Constitutional: Positive for chills and fever (low grade).  HENT: Positive for congestion. Negative for ear pain, sinus pain and sore throat.   Respiratory: Positive for cough.   Musculoskeletal: Positive for myalgias.  Neurological: Positive for headaches. Negative for dizziness.  All other systems reviewed and are negative.    Observations/Objective: Alert and oriented- answers all questions appropriately No  distress    Assessment and Plan: [name] in today with chief complaint of URI   1. URI 1. Take meds as prescribed 2. Use a cool mist humidifier especially during the winter months and when heat has been humid. 3. Use saline nose sprays frequently 4. Saline irrigations of the nose can be very helpful if done frequently.  * 4X daily for 1 week*  * Use of a nettie pot can be helpful with this. Follow directions with this* 5. Drink plenty of fluids 6. Keep thermostat turn down low 7.For any cough or congestion  Use plain Mucinex- regular strength or max strength is fine   * Children- consult with Pharmacist for dosing 8. For fever or aces or pains- take tylenol or ibuprofen appropriate for age and weight.  * for fevers greater than 101 orally you may alternate ibuprofen and tylenol every  3 hours.     2. Exposure to confirmed case of COVID-19 - Novel Coronavirus, NAA ([facility]); Future     Follow Up Instructions: prn    I discussed the assessment and treatment plan with the patient. The patient was provided an opportunity to ask questions and all were answered. The patient agreed with the plan and demonstrated an understanding of the instructions.   The patient was advised to call back or seek an in-person evaluation if the symptoms worsen or if the condition fails to improve as anticipated.  The above assessment and management plan was discussed with the patient. The patient verbalized understanding of and has agreed to the management plan. Patient is aware to call the clinic if symptoms persist or worsen. Patient is aware when to return to  the clinic for a follow-up visit. Patient educated on when it is appropriate to go to the emergency department.   Time call ended:  12:58  I provided 13 minutes of non-face-to-face time during this encounter.    Mary-Margaret Hassell Done, FNP

## 2020-09-09 NOTE — Addendum Note (Signed)
Addended by: Liliane Bade on: 09/09/2020 04:24 PM   Modules accepted: Orders

## 2020-09-10 LAB — NOVEL CORONAVIRUS, NAA: SARS-CoV-2, NAA: DETECTED — AB

## 2020-09-10 LAB — SARS-COV-2, NAA 2 DAY TAT

## 2020-09-11 ENCOUNTER — Other Ambulatory Visit: Payer: Self-pay | Admitting: Infectious Diseases

## 2020-09-11 ENCOUNTER — Telehealth: Payer: Self-pay | Admitting: Infectious Diseases

## 2020-09-11 DIAGNOSIS — IMO0002 Reserved for concepts with insufficient information to code with codable children: Secondary | ICD-10-CM

## 2020-09-11 DIAGNOSIS — U071 COVID-19: Secondary | ICD-10-CM

## 2020-09-11 DIAGNOSIS — I10 Essential (primary) hypertension: Secondary | ICD-10-CM

## 2020-09-11 NOTE — Progress Notes (Signed)
I connected by phone with [name] on 09/11/2020 at 1:07 PM to discuss the potential use of a new treatment for mild to moderate COVID-19 viral infection in non-hospitalized patients.  This patient is a 69 y.o. female that meets the FDA criteria for Emergency Use Authorization of COVID monoclonal antibody casirivimab/imdevimab, bamlanivimab/etesevimab, or sotrovimab.  Has a (+) direct SARS-CoV-2 viral test result  Has mild or moderate COVID-19   Is NOT hospitalized due to COVID-19  Is within 10 days of symptom onset  Has at least one of the high risk factor(s) for progression to severe COVID-19 and/or hospitalization as defined in EUA.  Specific high risk criteria : Older age (>/= 71 yo), Diabetes and Cardiovascular disease or hypertension   I have spoken and communicated the following to the patient or parent/caregiver regarding COVID monoclonal antibody treatment:  1. FDA has authorized the emergency use for the treatment of mild to moderate COVID-19 in adults and pediatric patients with positive results of direct SARS-CoV-2 viral testing who are 5 years of age and older weighing at least 40 kg, and who are at high risk for progressing to severe COVID-19 and/or hospitalization.  2. The significant known and potential risks and benefits of COVID monoclonal antibody, and the extent to which such potential risks and benefits are unknown.  3. Information on available alternative treatments and the risks and benefits of those alternatives, including clinical trials.  4. Patients treated with COVID monoclonal antibody should continue to self-isolate and use infection control measures (e.g., wear mask, isolate, social distance, avoid sharing personal items, clean and disinfect "high touch" surfaces, and frequent handwashing) according to CDC guidelines.   5. The patient or parent/caregiver has the option to accept or refuse COVID monoclonal antibody treatment.  After reviewing  this information with the patient, the patient has agreed to receive one of the available covid 19 monoclonal antibodies and will be provided an appropriate fact sheet prior to infusion. Janene Madeira, NP 09/11/2020 1:07 PM

## 2020-09-11 NOTE — Telephone Encounter (Signed)
Called to Discuss with patient about Covid symptoms and the use of the monoclonal antibody infusion for those with mild to moderate Covid symptoms and at a high risk of hospitalization.     Pt appears to qualify for this infusion due to co-morbid conditions and/or a member of an at-risk group in accordance with the FDA Emergency Use Authorization.    She has multiple qualifying risk criteria. Sx started  Vaccinated with Moderna. She started 12/15.   Needs some assistance with transportation - will set up for Tuesday morning.    Janene Madeira, MSN, NP-C [facility] [email or web address] Pager: [phone] Office: [phone] [place]: [phone]

## 2020-09-12 ENCOUNTER — Ambulatory Visit: Payer: PPO | Admitting: Family Medicine

## 2020-09-13 ENCOUNTER — Ambulatory Visit (HOSPITAL_COMMUNITY)
Admission: RE | Admit: 2020-09-13 | Discharge: 2020-09-13 | Disposition: A | Discharge status: Home / Self Care | Payer: Medicare Other | Source: Ambulatory Visit | Attending: Pulmonary Disease | Admitting: Pulmonary Disease

## 2020-09-13 DIAGNOSIS — Z794 Long term (current) use of insulin: Secondary | ICD-10-CM | POA: Diagnosis present

## 2020-09-13 DIAGNOSIS — Z23 Encounter for immunization: Secondary | ICD-10-CM | POA: Insufficient documentation

## 2020-09-13 DIAGNOSIS — I10 Essential (primary) hypertension: Secondary | ICD-10-CM | POA: Diagnosis present

## 2020-09-13 DIAGNOSIS — E1165 Type 2 diabetes mellitus with hyperglycemia: Secondary | ICD-10-CM | POA: Insufficient documentation

## 2020-09-13 DIAGNOSIS — U071 COVID-19: Secondary | ICD-10-CM | POA: Diagnosis present

## 2020-09-13 DIAGNOSIS — IMO0002 Reserved for concepts with insufficient information to code with codable children: Secondary | ICD-10-CM

## 2020-09-13 MED ORDER — METHYLPREDNISOLONE SODIUM SUCC 125 MG IJ SOLR: 125.0000 mg | Freq: Once | INTRAMUSCULAR | Status: DC | PRN

## 2020-09-13 MED ORDER — FAMOTIDINE IN NACL 20-0.9 MG/50ML-% IV SOLN: 20.0000 mg | Freq: Once | INTRAVENOUS | Status: DC | PRN

## 2020-09-13 MED ORDER — SODIUM CHLORIDE 0.9 % IV SOLN: INTRAVENOUS | Status: DC | PRN

## 2020-09-13 MED ORDER — DIPHENHYDRAMINE HCL 50 MG/ML IJ SOLN: 50.0000 mg | Freq: Once | INTRAMUSCULAR | Status: DC | PRN

## 2020-09-13 MED ORDER — EPINEPHRINE 0.3 MG/0.3ML IJ SOAJ: 0.3000 mg | Freq: Once | INTRAMUSCULAR | Status: DC | PRN

## 2020-09-13 MED ORDER — ALBUTEROL SULFATE HFA 108 (90 BASE) MCG/ACT IN AERS: 2.0000 | INHALATION_SPRAY | Freq: Once | RESPIRATORY_TRACT | Status: DC | PRN

## 2020-09-13 MED ORDER — SODIUM CHLORIDE 0.9 % IV SOLN: Freq: Once | INTRAVENOUS | Status: AC

## 2020-09-13 NOTE — Discharge Instructions (Signed)
If you have any questions or concerns after the infusion please call the Advanced Practice Provider on call at [phone]. This number is ONLY intended for your use regarding questions or concerns about the infusion post-treatment side-effects.  Please do not provide this number to others for use. For return to work notes please contact your primary care provider.   If someone you know is interested in receiving treatment please have them call the [place] hotline at [phone].       What types of side effects do monoclonal antibody drugs cause?  Common side effects  In general, the more common side effects caused by monoclonal antibody drugs include:  Allergic reactions, such as hives or itching  Flu-like signs and symptoms, including chills, fatigue, fever, and muscle aches and pains  Nausea, vomiting  Diarrhea  Skin rashes  Low blood pressure   The CDC is recommending patients who receive monoclonal antibody treatments wait at least 90 days before being vaccinated.  Currently, there are no data on the safety and efficacy of mRNA COVID-19 vaccines in persons who received monoclonal antibodies or convalescent plasma as part of COVID-19 treatment. Based on the estimated half-life of such therapies as well as evidence suggesting that reinfection is uncommon in the 90 days after initial infection, vaccination should be deferred for at least 90 days, as a precautionary measure until additional information becomes available, to avoid interference of the antibody treatment with vaccine-induced immune responses.       10 Things You Can Do to Manage Your COVID-19 Symptoms at Home If you have possible or confirmed COVID-19: 1. Stay home from work and school. And stay away from other public places. If you must go out, avoid using any kind of public transportation, ridesharing, or taxis. 2. Monitor your symptoms carefully. If your symptoms get worse, call your healthcare provider  immediately. 3. Get rest and stay hydrated. 4. If you have a medical appointment, call the healthcare provider ahead of time and tell them that you have or may have COVID-19. 5. For medical emergencies, call 911 and notify the dispatch personnel that you have or may have COVID-19. 6. Cover your cough and sneezes with a tissue or use the inside of your elbow. 7. Wash your hands often with soap and water for at least 20 seconds or clean your hands with an alcohol-based hand sanitizer that contains at least 60% alcohol. 8. As much as possible, stay in a specific room and away from other people in your home. Also, you should use a separate bathroom, if available. If you need to be around other people in or outside of the home, wear a mask. 9. Avoid sharing personal items with other people in your household, like dishes, towels, and bedding. 10. Clean all surfaces that are touched often, like counters, tabletops, and doorknobs. Use household cleaning sprays or wipes according to the label instructions. [email or web address] 03/25/2019 This information is not intended to replace advice given to you by your health care provider. Make sure you discuss any questions you have with your health care provider. Document Revised: 08/27/2019 Document Reviewed: 08/27/2019 Elsevier Patient Education  [place].

## 2020-09-13 NOTE — Progress Notes (Signed)
  Diagnosis: COVID-19  Physician: Dr. Wright  Procedure: Covid Infusion Clinic Med: bamlanivimab\etesevimab infusion - Provided patient with bamlanimivab\etesevimab fact sheet for patients, parents and caregivers prior to infusion.  Complications: No immediate complications noted.  Discharge: Discharged home   [name] 09/13/2020    

## 2020-09-20 ENCOUNTER — Other Ambulatory Visit: Payer: Self-pay | Admitting: Family Medicine

## 2020-09-24 ENCOUNTER — Emergency Department (HOSPITAL_COMMUNITY): Payer: PPO

## 2020-09-24 ENCOUNTER — Emergency Department (HOSPITAL_COMMUNITY)
Admission: EM | Admit: 2020-09-24 | Discharge: 2020-09-24 | Disposition: A | Discharge status: Home / Self Care | Payer: PPO | Attending: Emergency Medicine | Admitting: Emergency Medicine

## 2020-09-24 ENCOUNTER — Encounter (HOSPITAL_COMMUNITY): Payer: Self-pay | Admitting: Emergency Medicine

## 2020-09-24 ENCOUNTER — Other Ambulatory Visit: Payer: Self-pay

## 2020-09-24 DIAGNOSIS — U071 COVID-19: Secondary | ICD-10-CM | POA: Insufficient documentation

## 2020-09-24 DIAGNOSIS — I2510 Atherosclerotic heart disease of native coronary artery without angina pectoris: Secondary | ICD-10-CM | POA: Diagnosis not present

## 2020-09-24 DIAGNOSIS — I739 Peripheral vascular disease, unspecified: Secondary | ICD-10-CM | POA: Diagnosis not present

## 2020-09-24 DIAGNOSIS — N3000 Acute cystitis without hematuria: Secondary | ICD-10-CM | POA: Insufficient documentation

## 2020-09-24 DIAGNOSIS — E1165 Type 2 diabetes mellitus with hyperglycemia: Secondary | ICD-10-CM | POA: Insufficient documentation

## 2020-09-24 DIAGNOSIS — I10 Essential (primary) hypertension: Secondary | ICD-10-CM | POA: Diagnosis not present

## 2020-09-24 DIAGNOSIS — J1282 Pneumonia due to coronavirus disease 2019: Secondary | ICD-10-CM

## 2020-09-24 DIAGNOSIS — Z955 Presence of coronary angioplasty implant and graft: Secondary | ICD-10-CM | POA: Diagnosis not present

## 2020-09-24 DIAGNOSIS — Z7984 Long term (current) use of oral hypoglycemic drugs: Secondary | ICD-10-CM | POA: Diagnosis not present

## 2020-09-24 DIAGNOSIS — Z8673 Personal history of transient ischemic attack (TIA), and cerebral infarction without residual deficits: Secondary | ICD-10-CM | POA: Diagnosis not present

## 2020-09-24 DIAGNOSIS — Z7982 Long term (current) use of aspirin: Secondary | ICD-10-CM | POA: Insufficient documentation

## 2020-09-24 DIAGNOSIS — R531 Weakness: Secondary | ICD-10-CM | POA: Diagnosis present

## 2020-09-24 DIAGNOSIS — Z79899 Other long term (current) drug therapy: Secondary | ICD-10-CM | POA: Diagnosis not present

## 2020-09-24 DIAGNOSIS — R079 Chest pain, unspecified: Secondary | ICD-10-CM | POA: Diagnosis not present

## 2020-09-24 DIAGNOSIS — G319 Degenerative disease of nervous system, unspecified: Secondary | ICD-10-CM | POA: Diagnosis not present

## 2020-09-24 DIAGNOSIS — Z794 Long term (current) use of insulin: Secondary | ICD-10-CM | POA: Insufficient documentation

## 2020-09-24 DIAGNOSIS — R739 Hyperglycemia, unspecified: Secondary | ICD-10-CM

## 2020-09-24 DIAGNOSIS — S0990XA Unspecified injury of head, initial encounter: Secondary | ICD-10-CM | POA: Diagnosis not present

## 2020-09-24 LAB — CBC WITH DIFFERENTIAL/PLATELET
Abs Immature Granulocytes: 0.12 10*3/uL — ABNORMAL HIGH (ref 0.00–0.07)
Basophils Absolute: 0 10*3/uL (ref 0.0–0.1)
Basophils Relative: 0 %
Eosinophils Absolute: 0 10*3/uL (ref 0.0–0.5)
Eosinophils Relative: 0 %
HCT: 31.7 % — ABNORMAL LOW (ref 36.0–46.0)
Hemoglobin: 10.2 g/dL — ABNORMAL LOW (ref 12.0–15.0)
Immature Granulocytes: 1 %
Lymphocytes Relative: 3 %
Lymphs Abs: 0.6 10*3/uL — ABNORMAL LOW (ref 0.7–4.0)
MCH: 28.4 pg (ref 26.0–34.0)
MCHC: 32.2 g/dL (ref 30.0–36.0)
MCV: 88.3 fL (ref 80.0–100.0)
Monocytes Absolute: 0.8 10*3/uL (ref 0.1–1.0)
Monocytes Relative: 4 %
Neutro Abs: 17.7 10*3/uL — ABNORMAL HIGH (ref 1.7–7.7)
Neutrophils Relative %: 92 %
Platelets: 295 10*3/uL (ref 150–400)
RBC: 3.59 MIL/uL — ABNORMAL LOW (ref 3.87–5.11)
RDW: 13.6 % (ref 11.5–15.5)
WBC: 19.2 10*3/uL — ABNORMAL HIGH (ref 4.0–10.5)
nRBC: 0 % (ref 0.0–0.2)

## 2020-09-24 LAB — URINALYSIS, ROUTINE W REFLEX MICROSCOPIC
Bilirubin Urine: NEGATIVE
Glucose, UA: 150 mg/dL — AB
Ketones, ur: NEGATIVE mg/dL
Nitrite: NEGATIVE
Protein, ur: NEGATIVE mg/dL
Specific Gravity, Urine: 1.009 (ref 1.005–1.030)
pH: 6 (ref 5.0–8.0)

## 2020-09-24 LAB — COMPREHENSIVE METABOLIC PANEL
ALT: 13 U/L (ref 0–44)
AST: 21 U/L (ref 15–41)
Albumin: 2.9 g/dL — ABNORMAL LOW (ref 3.5–5.0)
Alkaline Phosphatase: 46 U/L (ref 38–126)
Anion gap: 12 (ref 5–15)
BUN: 14 mg/dL (ref 8–23)
CO2: 27 mmol/L (ref 22–32)
Calcium: 8.9 mg/dL (ref 8.9–10.3)
Chloride: 97 mmol/L — ABNORMAL LOW (ref 98–111)
Creatinine, Ser: 1.04 mg/dL — ABNORMAL HIGH (ref 0.44–1.00)
GFR, Estimated: 57 mL/min — ABNORMAL LOW (ref >60)
Glucose, Bld: 304 mg/dL — ABNORMAL HIGH (ref 70–99)
Potassium: 3.4 mmol/L — ABNORMAL LOW (ref 3.5–5.1)
Sodium: 136 mmol/L (ref 135–145)
Total Bilirubin: 0.6 mg/dL (ref 0.3–1.2)
Total Protein: 7.1 g/dL (ref 6.5–8.1)

## 2020-09-24 LAB — TROPONIN I (HIGH SENSITIVITY)
Troponin I (High Sensitivity): 12 ng/L (ref <18)
Troponin I (High Sensitivity): 15 ng/L (ref <18)

## 2020-09-24 MED ORDER — CEFDINIR 300 MG PO CAPS: 300.0000 mg | ORAL_CAPSULE | Freq: Two times a day (BID) | ORAL | 0 refills | Status: AC

## 2020-09-24 MED ORDER — SODIUM CHLORIDE 0.9 % IV BOLUS
500.0000 mL | Freq: Once | INTRAVENOUS | Status: AC
  Administered 2020-09-24: 500 mL via INTRAVENOUS

## 2020-09-24 MED ORDER — INSULIN ASPART 100 UNIT/ML ~~LOC~~ SOLN
2.0000 [IU] | Freq: Once | SUBCUTANEOUS | Status: AC
  Administered 2020-09-24: 2 [IU] via INTRAVENOUS
  Filled 2020-09-24: qty 1

## 2020-09-24 MED ORDER — CEFDINIR 300 MG PO CAPS
300.0000 mg | ORAL_CAPSULE | Freq: Two times a day (BID) | ORAL | Status: DC
  Administered 2020-09-24: 300 mg via ORAL
  Filled 2020-09-24: qty 1

## 2020-09-24 NOTE — ED Provider Notes (Signed)
[facility] EMERGENCY DEPARTMENT Provider Note   CSN: [id number] Arrival date & time: 09/24/20  1255     History Chief Complaint  Patient presents with  . Shortness of Breath  . Hyperglycemia  . Fall    [name] is a 70 y.o. female-history of CAD, depression, diverticulitis, hypertension, GERD, diabetes, MS,, recent Covid diagnosis who presents for evaluation of generalized weakness, nausea/vomiting, hyperglycemia.  Patient reports that she was diagnosed with Covid about 2-3 weeks ago.  She states for that entire 2 to 3-week period, she has had intermittent nausea/vomiting, chest pain, shortness of breath, generalized weakness.  She states she has had decreased appetite and has not wanted to eat much.  She will have intermittent episodes of vomiting.  Emesis is nonbloody, nonbilious.  She states that is mostly occurs when she has episodes of coughing.  She will cough so much that she start vomiting.  She reports that she took an at-home test a few days ago and it was negative.  She states that yesterday, she felt like her symptoms worsen.  She felt she had no energy to get up and clean her house.  She reports that when she woke up this morning, she checked her blood sugar and it was 235 which is a little bit higher than her norm.  She reports that most the time she has between 170-220.  She states she did not have appetite and did not eat.  She had a friend come over and noted that she looked clammy and patient states she did not feel good.  They took her temperature and it was 100.2.  Patient then had episode of nonbloody, nonbilious vomiting.  Patient reports that she was going to lie in bed when she went there, she felt lightheaded like she was going to pass out which caused her to fall and hit the nightstand.  She states she hit her head on the wall.  Does not think she had LOC.  She is on aspirin.  Patient reports that her family wanted her to come in to get evaluated.  She states  she has had persistent shortness of breath since her Covid diagnosis.  She feels like it is mostly when she walks around.  She states that the chest pain does feel like an uncomfortable feeling on the left side of her chest.  It is not worsened with exertion or deep inspiration.  She has not noted any abdominal pain, swelling of her legs.  The history is provided by the patient.       Past Medical History:  Diagnosis Date  . Anxiety   . CAD (coronary artery disease)    DES to circumflex 02/2007, BMS to LAD and PTCA diagonal 03/2007  . Carotid artery plaque    Mild  . Cataract   . Depression   . Diverticulitis, colon   . Elevated d-dimer 01/08/2014  . Essential hypertension, benign   . GERD (gastroesophageal reflux disease)   . H/O hiatal hernia   . HLD (hyperlipidemia)   . IDDM (insulin dependent diabetes mellitus)   . Migraine    "used to have them really bad; don't have them anymore" (01/07/2014)  . MS (multiple sclerosis) ([place])    Not confirmed  . PAT (paroxysmal atrial tachycardia) ([place])   . Prolapse of uterus   . PVD (peripheral vascular disease) ([place])   . TIA (transient ischemic attack) 1980's    Patient Active Problem List   Diagnosis Date Noted  .  Osteopenia after menopause 12/13/2017  . H/O prolonged Q-T interval on ECG 03/06/2017  . B12 deficiency 01/13/2016  . History of stroke 09/01/2015  . History of TIA (transient ischemic attack) 08/14/2014  . DM type 2 with diabetic dyslipidemia ([place]) 08/14/2014  . Cataract 02/08/2014  . Macular degeneration 02/08/2014  . Accelerating angina ([place]) 01/07/2014  . Generalized anxiety disorder 09/14/2013  . Claudication ([place]) 06/18/2013  . GERD (gastroesophageal reflux disease) 01/01/2012  . Uncontrolled type 2 diabetes mellitus with insulin therapy ([place]) 08/23/2011  . Essential hypertension 08/23/2011  . PALPITATIONS 02/10/2010  . Hyperlipidemia 05/10/2009  . Depression with anxiety 05/10/2009  . Coronary artery disease  involving native coronary artery of native heart with angina pectoris ([place]) 08/26/2008    Past Surgical History:  Procedure Laterality Date  . ABDOMINAL HYSTERECTOMY  1986   ovaries remain - prolaspe uterus   . APPENDECTOMY  ~ 1970  . BREAST BIOPSY Right 1980's  . BREAST LUMPECTOMY Right 1980's   Dr. Charlynne Pander   . CARDIAC CATHETERIZATION  01/07/2014  . CHOLECYSTECTOMY  ?1987  . COLONOSCOPY  2002   Dr. Anwar--> Severe diverticular changes in the region of the sigmoid and descending colon with scattered diverticular changes throughout the rest of the colon. No polyps, ulcerations. Despite numerous manipulations, the tip of the scope could not be tipped into the cecal area.  . COLONOSCOPY  01/10/2012   Procedure: COLONOSCOPY;  Surgeon: Daneil Dolin, MD;  Location: [facility];  Service: Endoscopy;  Laterality: N/A;  1:55  . CORONARY ANGIOPLASTY WITH STENT PLACEMENT  ~ 1997 X 2   "2 + 1"  . EYE SURGERY Bilateral 2014   cataract  . INTRAVASCULAR PRESSURE WIRE/FFR STUDY N/A 03/08/2017   Procedure: Intravascular Pressure Wire/FFR Study;  Surgeon: Nelva Bush, MD;  Location: [place] CV LAB;  Service: Cardiovascular;  Laterality: N/A;  . LEFT HEART CATH AND CORONARY ANGIOGRAPHY N/A 03/08/2017   Procedure: Left Heart Cath and Coronary Angiography;  Surgeon: Nelva Bush, MD;  Location: [place] CV LAB;  Service: Cardiovascular;  Laterality: N/A;  . LEFT HEART CATHETERIZATION WITH CORONARY ANGIOGRAM N/A 01/07/2014   Procedure: LEFT HEART CATHETERIZATION WITH CORONARY ANGIOGRAM;  Surgeon: Larey Dresser, MD;  Location: [facility] CATH LAB;  Service: Cardiovascular;  Laterality: N/A;     OB History   No obstetric history on file.     Family History  Problem Relation Age of Onset  . Heart attack Mother 23  . Diabetes Mother   . Hypertension Mother   . Heart attack Father 30  . Heart attack Brother 32       x 6  . Heart disease Brother   . Diabetes Brother   . Colon cancer  Paternal Aunt        79s, died with brain anuerysm  . Crohn's disease Cousin        paternal  . Diabetes Sister   . GER disease Daughter   . Cervical cancer Daughter   . Diabetes Daughter     Social History   Tobacco Use  . Smoking status: Never Smoker  . Smokeless tobacco: Never Used  . Tobacco comment: spouse, 51 years - husband has quit 01/2011  Vaping Use  . Vaping Use: Never used  Substance Use Topics  . Alcohol use: No  . Drug use: No    Home Medications Prior to Admission medications   Medication Sig Start Date End Date Taking? Authorizing Provider  cefdinir (OMNICEF) 300 MG capsule  Take 1 capsule (300 mg total) by mouth 2 (two) times daily for 7 days. 09/24/20 10/01/20 Yes Graciella Freer A, PA-C  amLODipine (NORVASC) 5 MG tablet TAKE ONE (1) TABLET EACH DAY 05/16/20   Dettinger, Elige Radon, MD  aspirin 325 MG tablet Take 325 mg by mouth daily.    [provider]  atenolol (TENORMIN) 50 MG tablet Take 1 tablet (50 mg total) by mouth 2 (two) times daily. 01/13/19   Ernestina Penna, MD  calcium carbonate (OS-CAL) 600 MG TABS tablet Take 1 tablet (600 mg total) by mouth 2 (two) times daily with a meal. 07/19/20   Gabriel Earing, FNP  citalopram (CELEXA) 40 MG tablet TAKE ONE (1) TABLET EACH DAY 06/17/19   Dettinger, Elige Radon, MD  clonazePAM (KLONOPIN) 0.5 MG tablet Take 1 tablet (0.5 mg total) by mouth at bedtime as needed for anxiety. 06/17/19   Dettinger, Elige Radon, MD  Continuous Blood Gluc Sensor (FREESTYLE LIBRE 14 DAY SENSOR) MISC USE TO CHECK BLOOD GLUCOSE 12/21/19   Dettinger, Elige Radon, MD  empagliflozin (JARDIANCE) 10 MG TABS tablet Take 10 mg by mouth daily. 02/20/19   Ernestina Penna, MD  esomeprazole (NEXIUM) 40 MG capsule Take 1 capsule (40 mg total) by mouth daily. 03/17/20   Gwenlyn Fudge, FNP  FLUZONE HIGH-DOSE QUADRIVALENT 0.7 ML SUSY  06/25/20   [provider]  furosemide (LASIX) 20 MG tablet TAKE ONE (1) TABLET EACH DAY 05/31/20   Gabriel Earing, FNP  isosorbide mononitrate (IMDUR) 120 MG 24 hr tablet TAKE ONE (1) TABLET EACH DAY 03/22/20   Dettinger, Elige Radon, MD  lisinopril (ZESTRIL) 40 MG tablet Take 1 tablet (40 mg total) by mouth daily. 01/13/19   Ernestina Penna, MD  meclizine (ANTIVERT) 25 MG tablet Take 1 tablet (25 mg total) by mouth 3 (three) times daily as needed for dizziness. 11/26/19   Dettinger, Elige Radon, MD  Misc. Devices (WALL GRAB BAR) MISC For bathtub/shower dx: M17.0, 173.9, Z91.8 05/12/19   Dettinger, Elige Radon, MD  nitroGLYCERIN (NITROSTAT) 0.4 MG SL tablet DISSOLVE 1 TABLET UNDER TONGUE FOR CHESTPAIN.MAY REPEAT EVERY 5 MINUTES FOR 3 DOSES.IF NO RELIEF CALL 911 OR GO TO ER 06/08/20   Gabriel Earing, FNP  NOVOLOG FLEXPEN 100 UNIT/ML FlexPen INJECT 35-40 UNITS SQ 3 TIMES DAILY WITH MEALS Patient taking differently: 25 Units.  09/15/19   Dettinger, Elige Radon, MD  ondansetron (ZOFRAN) 8 MG tablet Take 0.5 tablets (4 mg total) by mouth every 6 (six) hours as needed. 07/12/20   Gabriel Earing, FNP  potassium chloride SA (KLOR-CON) 20 MEQ tablet TAKE TWO TABLETS BY MOUTH DAILY 09/20/20   Gabriel Earing, FNP  rOPINIRole (REQUIP) 0.5 MG tablet Take 1 tablet (0.5 mg total) by mouth at bedtime. 09/30/19   Dettinger, Elige Radon, MD  rosuvastatin (CRESTOR) 40 MG tablet TAKE ONE (1) TABLET EACH DAY 04/14/20   Dettinger, Elige Radon, MD  Semaglutide,0.25 or 0.5MG /DOS, (OZEMPIC, 0.25 OR 0.5 MG/DOSE,) 2 MG/1.5ML SOPN Inject 0.375 mLs (0.5 mg total) into the skin once a week. 04/20/20   Dettinger, Elige Radon, MD  spironolactone (ALDACTONE) 25 MG tablet Take 1 tablet (25 mg total) by mouth daily. 05/31/20 06/30/20  Gabriel Earing, FNP  TOUJEO SOLOSTAR 300 UNIT/ML SOPN INJECT 50-60 UNITS SQ DAILY 09/15/19   Dettinger, Elige Radon, MD  vitamin B-12 (CYANOCOBALAMIN) 1000 MCG tablet Take 1,000 mcg by mouth daily.    [provider]  Vitamin D, Ergocalciferol, (DRISDOL)  1.25 MG (50000 UNIT) CAPS capsule TAKE 1 CAPSULE EVERY 7 DAYS 07/19/20    Gwenlyn Perking, FNP    Allergies    Iohexol, Ticlid [ticlopidine hcl], and Codeine  Review of Systems   Review of Systems  Constitutional: Positive for appetite change and fever.  Respiratory: Positive for shortness of breath. Negative for cough.   Cardiovascular: Positive for chest pain.  Gastrointestinal: Positive for nausea and vomiting. Negative for abdominal pain.  Genitourinary: Negative for dysuria and hematuria.  Neurological: Positive for weakness (generalized) and light-headedness. Negative for headaches.  All other systems reviewed and are negative.   Physical Exam Updated Vital Signs BP (!) 160/67 (BP Location: Left Arm)   Pulse 88   Temp 99.2 F (37.3 C) (Oral)   Resp (!) 23   SpO2 99%   Physical Exam Vitals and nursing note reviewed.  Constitutional:      Appearance: Normal appearance. She is well-developed and well-nourished.  HENT:     Head: Normocephalic and atraumatic.     Comments: No tenderness to palpation of skull. No deformities or crepitus noted. No open wounds, abrasions or lacerations.     Mouth/Throat:     Mouth: Oropharynx is clear and moist. Mucous membranes are dry.  Eyes:     General: Lids are normal.     Extraocular Movements: EOM normal.     Conjunctiva/sclera: Conjunctivae normal.     Pupils: Pupils are equal, round, and reactive to light.     Comments: PERRL. EOMs intact. No nystagmus. No neglect.   Cardiovascular:     Rate and Rhythm: Normal rate and regular rhythm.     Pulses: Normal pulses.          Radial pulses are 2+ on the right side and 2+ on the left side.       Dorsalis pedis pulses are 2+ on the right side and 2+ on the left side.     Heart sounds: Normal heart sounds. No murmur heard. No friction rub. No gallop.   Pulmonary:     Effort: Pulmonary effort is normal.     Breath sounds: Normal breath sounds.     Comments: Lungs clear to auscultation bilaterally.  Symmetric chest rise.  No wheezing, rales,  rhonchi. Abdominal:     Palpations: Abdomen is soft. Abdomen is not rigid.     Tenderness: There is no abdominal tenderness. There is no guarding.     Comments: Abdomen is soft, non-distended, non-tender. No rigidity, No guarding. No peritoneal signs.  Musculoskeletal:        General: Normal range of motion.     Cervical back: Full passive range of motion without pain.  Skin:    General: Skin is warm and dry.     Capillary Refill: Capillary refill takes less than 2 seconds.  Neurological:     Mental Status: She is alert and oriented to person, place, and time.     Comments: Cranial nerves III-XII intact Follows commands, Moves all extremities  5/5 strength to BUE and BLE  Sensation intact throughout all major nerve distributions No slurred speech. No facial droop.   Psychiatric:        Mood and Affect: Mood and affect normal.        Speech: Speech normal.     ED Results / Procedures / Treatments   Labs (all labs ordered are listed, but only abnormal results are displayed) Labs Reviewed  COMPREHENSIVE METABOLIC PANEL - Abnormal; Notable for the following components:  Result Value   Potassium 3.4 (*)    Chloride 97 (*)    Glucose, Bld 304 (*)    Creatinine, Ser 1.04 (*)    Albumin 2.9 (*)    GFR, Estimated 57 (*)    All other components within normal limits  CBC WITH DIFFERENTIAL/PLATELET - Abnormal; Notable for the following components:   WBC 19.2 (*)    RBC 3.59 (*)    Hemoglobin 10.2 (*)    HCT 31.7 (*)    Neutro Abs 17.7 (*)    Lymphs Abs 0.6 (*)    Abs Immature Granulocytes 0.12 (*)    All other components within normal limits  URINALYSIS, ROUTINE W REFLEX MICROSCOPIC - Abnormal; Notable for the following components:   Glucose, UA 150 (*)    Hgb urine dipstick SMALL (*)    Leukocytes,Ua MODERATE (*)    Bacteria, UA RARE (*)    All other components within normal limits  TROPONIN I (HIGH SENSITIVITY)  TROPONIN I (HIGH SENSITIVITY)    EKG EKG  Interpretation  Date/Time:  Saturday September 24 2020 13:11:48 EST Ventricular Rate:  100 PR Interval:    QRS Duration: 85 QT Interval:  403 QTC Calculation: 520 R Axis:   -30 Text Interpretation: Sinus tachycardia Abnormal R-wave progression, late transition Inferior infarct, old Prolonged QT interval No significant change was found Confirmed by Gerlene Fee [id number]) on 09/24/2020 3:38:49 PM   Radiology CT Head Wo Contrast  Result Date: 09/24/2020 CLINICAL DATA:  Fall, hit head EXAM: CT HEAD WITHOUT CONTRAST TECHNIQUE: Contiguous axial images were obtained from the base of the skull through the vertex without intravenous contrast. COMPARISON:  09/21/2017 FINDINGS: Brain: There is atrophy and chronic small vessel disease changes. No acute intracranial abnormality. Specifically, no hemorrhage, hydrocephalus, mass lesion, acute infarction, or significant intracranial injury. Vascular: No hyperdense vessel or unexpected calcification. Skull: No acute calvarial abnormality. Sinuses/Orbits: No acute finding Other: None IMPRESSION: Atrophy, chronic microvascular disease. No acute intracranial abnormality. Electronically Signed   By: Rolm Baptise M.D.   On: 09/24/2020 14:31   DG Chest Portable 1 View  Result Date: 09/24/2020 CLINICAL DATA:  COVID, chest pain EXAM: PORTABLE CHEST 1 VIEW COMPARISON:  04/08/2020 FINDINGS: Heart is normal size. Patchy bilateral peripheral airspace opacities compatible with COVID pneumonia. No effusions or pneumothorax. No acute bony abnormality. IMPRESSION: Patchy bilateral airspace opacities compatible with COVID pneumonia. Electronically Signed   By: Rolm Baptise M.D.   On: 09/24/2020 14:11    Procedures Procedures (including critical care time)  Medications Ordered in ED Medications  cefdinir (OMNICEF) capsule 300 mg (300 mg Oral Given 09/24/20 1831)  sodium chloride 0.9 % bolus 500 mL (0 mLs Intravenous Stopped 09/24/20 1744)  insulin aspart (novoLOG) injection 2 Units  (2 Units Intravenous Given 09/24/20 1832)    ED Course  I have reviewed the triage vital signs and the nursing notes.  Pertinent labs & imaging results that were available during my care of the patient were reviewed by me and considered in my medical decision making (see chart for details).    MDM Rules/Calculators/A&P                          70 y.o. F presents for evaluation of multiple complaints.  Recently diagnosed with Covid 2 to 3 weeks ago.  Since then has had intermittent nausea/vomiting, generalized weakness, fatigue, chest pain, shortness of breath.  No nausea/vomiting actually sounds like posttussive emesis since it mainly  occurs after coughing.  Feel like it worsened yesterday.  Today, had an episode where she felt lightheaded fell against a nightstand.  She did not lose consciousness.  No true syncope.  On initial arrival, she is afebrile, nontoxic-appearing.  Vital signs are stable.  No neuro deficits noted on exam.  She does have slightly dry mucous membranes.  Concern for continued COVID-19 process versus cardiac etiology versus DKA versus dehydration.  Plan to check labs, urine, chest x-ray, EKG.  Trop is negative. CMP shows potassium of 3.4. Bicarb is 27. BUN is 14, Cr of 1.04. Glucose is 304. Anion gap is 12.  Work-up is not consistent with DKA.  Patient given small amount of insulin.  CBC shows leukocytosis of 19.2.  She has not been on any recent prednisone.  Hemoglobin is 10.2.  This is stable from previous.  Chest x-ray shows patchy bilateral airspace opacities consistent with pneumonia.  CT head negative for any acute abnormality.  UA does show moderate leukocytes, pyuria, bacteria.  Patient does report that she has had some urinary tract infection symptoms and was actually concerned she had a UTI.  Given that she is symptomatic, we will plan to treat.  ED attending and I discussed admission versus discharge with patient.  Patient states she would like to go home does not wish  to be admitted to the hospital.  We will plan to ambulate patient to see her O2 sats.  Patient ambulated in the ED while maintaining O2 sats of 93-95% on room air.  She is hemodynamically stable.  She has been able tolerate p.o. in the department any difficulty.  She has benign abdomen.  No concern for surgical abdominal process.  Given that patient is hemodynamically stable with no evidence of hypoxia, we will plan to discharge home. I did again offer admission, but she declined. We will start her on Omnicef to treat both pneumonia and UTI. Discussed patient with Dr. Sedonia Small who is agreeable to plan. Patient had ample opportunity for questions and discussion. All patient's questions were answered with full understanding. Strict return precautions discussed. Patient expresses understanding and agreement to plan.   Portions of this note were generated with [employer]. Dictation errors may occur despite best attempts at proofreading.  Taimi Piner was evaluated in Emergency Department on 09/24/2020 for the symptoms described in the history of present illness. She was evaluated in the context of the global COVID-19 pandemic, which necessitated consideration that the patient might be at risk for infection with the SARS-CoV-2 virus that causes COVID-19. Institutional protocols and algorithms that pertain to the evaluation of patients at risk for COVID-19 are in a state of rapid change based on information released by regulatory bodies including the CDC and federal and state organizations. These policies and algorithms were followed during the patient's care in the ED.   Final Clinical Impression(s) / ED Diagnoses Final diagnoses:  Pneumonia due to COVID-19 virus  Acute cystitis without hematuria  Hyperglycemia    Rx / DC Orders ED Discharge Orders         Ordered    cefdinir (OMNICEF) 300 MG capsule  2 times daily        09/24/20 1819           Desma Mcgregor 09/24/20 1924    Maudie Flakes, MD 09/24/20 2355

## 2020-09-24 NOTE — ED Notes (Signed)
Pt ambulated to restroom. O2 remained 93-95% during ambulation. No complaints from pt

## 2020-09-24 NOTE — ED Notes (Signed)
PA made aware of ambulation at this time.

## 2020-09-24 NOTE — ED Notes (Signed)
With the assistance of nurse aid, patient ambulated with continuous pulse oximetry per provider order at this time. Pt's SpO2 maintained 93-95% on RA with ambulation. Pt denies any increase in shortness of breath, denies chest pain or dizziness with ambulation. Pt back into bed, cardiac monitor in place. All questions and concerns voiced addressed at this time. Will continue to monitor.

## 2020-09-24 NOTE — ED Triage Notes (Signed)
Pt c/ko chest tightness/low back pain and headache x 2 weeks. States no worse than usuall. Thinks she has a UTI due to urinary pressure with burning x 1 week. Nad.

## 2020-09-24 NOTE — ED Notes (Signed)
Pt to CT at this time via CT staff.

## 2020-09-24 NOTE — Discharge Instructions (Addendum)
Take antibiotics as directed. Please take all of your antibiotics until finished.  Make sure you are staying hydrated and drinking fluids.   Closely monitor your blood sugar.  Follow up with your primary care  doctor.   Return to the Emergency Dept for any difficulty breathing, vomiting, abdominal pain or new worsening concerning symptoms.

## 2020-09-24 NOTE — ED Triage Notes (Signed)
Called ems due to covid pos and hyperglycemia. Called came out with sob/gen weakness. No resp distress/sob noted. bs en route 320.arrived a/o.

## 2020-09-24 NOTE — ED Notes (Signed)
Pt given and drinking ice water and tolerating well.

## 2020-09-24 NOTE — ED Notes (Signed)
X Ray at bedside at this time.  

## 2020-09-24 NOTE — ED Notes (Addendum)

## 2020-09-26 ENCOUNTER — Ambulatory Visit: Payer: PPO | Admitting: Family Medicine

## 2020-09-27 ENCOUNTER — Other Ambulatory Visit: Payer: Self-pay | Admitting: Family Medicine

## 2020-09-27 ENCOUNTER — Telehealth: Payer: Self-pay

## 2020-09-27 DIAGNOSIS — Z8639 Personal history of other endocrine, nutritional and metabolic disease: Secondary | ICD-10-CM

## 2020-09-27 DIAGNOSIS — F411 Generalized anxiety disorder: Secondary | ICD-10-CM

## 2020-09-27 DIAGNOSIS — R112 Nausea with vomiting, unspecified: Secondary | ICD-10-CM

## 2020-09-27 NOTE — Telephone Encounter (Signed)
  Prescription Request  09/27/2020  What is the name of the medication or equipment? 4 or 5 different ones that [facility] faxed in  Have you contacted your pharmacy to request a refill? (if applicable) yes  Which pharmacy would you like this sent to? [facility]   Patient notified that their request is being sent to the clinical staff for review and that they should receive a response within 2 business days.   Morgan's pt.  She needs today!  Please call pt.

## 2020-09-28 MED ORDER — ONDANSETRON HCL 8 MG PO TABS: 4.0000 mg | ORAL_TABLET | Freq: Four times a day (QID) | ORAL | 0 refills | Status: DC | PRN

## 2020-09-28 NOTE — Telephone Encounter (Signed)
Pt was scheduled to see Tiffany 09/30/20 but Elmarie Shiley is out due to illness and pt needed refill on her zofran. Refill sent to pharmacy and pt is scheduled to see Tiffany 10/11/20 at 8:00.

## 2020-09-30 ENCOUNTER — Ambulatory Visit: Payer: PPO | Admitting: Family Medicine

## 2020-10-10 ENCOUNTER — Observation Stay (HOSPITAL_COMMUNITY)
Admission: EM | Admit: 2020-10-10 | Discharge: 2020-10-12 | Disposition: A | Discharge status: Home / Self Care | Payer: PPO | Attending: Family Medicine | Admitting: Family Medicine

## 2020-10-10 ENCOUNTER — Other Ambulatory Visit: Payer: Self-pay

## 2020-10-10 ENCOUNTER — Emergency Department (HOSPITAL_COMMUNITY): Payer: PPO

## 2020-10-10 ENCOUNTER — Encounter (HOSPITAL_COMMUNITY): Payer: Self-pay

## 2020-10-10 DIAGNOSIS — N390 Urinary tract infection, site not specified: Secondary | ICD-10-CM | POA: Diagnosis not present

## 2020-10-10 DIAGNOSIS — E1169 Type 2 diabetes mellitus with other specified complication: Secondary | ICD-10-CM | POA: Diagnosis present

## 2020-10-10 DIAGNOSIS — E782 Mixed hyperlipidemia: Secondary | ICD-10-CM | POA: Diagnosis present

## 2020-10-10 DIAGNOSIS — Z794 Long term (current) use of insulin: Secondary | ICD-10-CM | POA: Diagnosis not present

## 2020-10-10 DIAGNOSIS — I10 Essential (primary) hypertension: Secondary | ICD-10-CM | POA: Diagnosis present

## 2020-10-10 DIAGNOSIS — E785 Hyperlipidemia, unspecified: Secondary | ICD-10-CM | POA: Diagnosis present

## 2020-10-10 DIAGNOSIS — R4020 Unspecified coma: Secondary | ICD-10-CM | POA: Diagnosis not present

## 2020-10-10 DIAGNOSIS — J189 Pneumonia, unspecified organism: Secondary | ICD-10-CM | POA: Diagnosis not present

## 2020-10-10 DIAGNOSIS — R531 Weakness: Secondary | ICD-10-CM | POA: Diagnosis not present

## 2020-10-10 DIAGNOSIS — E1159 Type 2 diabetes mellitus with other circulatory complications: Secondary | ICD-10-CM | POA: Diagnosis present

## 2020-10-10 DIAGNOSIS — Z8673 Personal history of transient ischemic attack (TIA), and cerebral infarction without residual deficits: Secondary | ICD-10-CM

## 2020-10-10 DIAGNOSIS — R0902 Hypoxemia: Secondary | ICD-10-CM | POA: Diagnosis not present

## 2020-10-10 DIAGNOSIS — E1165 Type 2 diabetes mellitus with hyperglycemia: Secondary | ICD-10-CM

## 2020-10-10 DIAGNOSIS — R296 Repeated falls: Secondary | ICD-10-CM

## 2020-10-10 DIAGNOSIS — Z7982 Long term (current) use of aspirin: Secondary | ICD-10-CM | POA: Diagnosis not present

## 2020-10-10 DIAGNOSIS — K219 Gastro-esophageal reflux disease without esophagitis: Secondary | ICD-10-CM | POA: Diagnosis present

## 2020-10-10 DIAGNOSIS — E876 Hypokalemia: Secondary | ICD-10-CM

## 2020-10-10 DIAGNOSIS — Z79899 Other long term (current) drug therapy: Secondary | ICD-10-CM | POA: Insufficient documentation

## 2020-10-10 DIAGNOSIS — R9431 Abnormal electrocardiogram [ECG] [EKG]: Secondary | ICD-10-CM

## 2020-10-10 DIAGNOSIS — R0602 Shortness of breath: Secondary | ICD-10-CM | POA: Diagnosis not present

## 2020-10-10 MED ORDER — SODIUM CHLORIDE 0.9 % IV BOLUS
500.0000 mL | Freq: Once | INTRAVENOUS | Status: AC
  Administered 2020-10-11: 500 mL via INTRAVENOUS

## 2020-10-10 NOTE — ED Triage Notes (Signed)
Per EMS, patient's son called for possible stroke.  When EMS arrived, no s/s of stroke.  EMS states patient had ground level fall tonight while getting out of bed.  They state that patient did hit head but patient was unsure of LOC.  Patient complains of generalized weakness.  States she was admitted recently for pneumonia and "has just not bounced back."  Positive for Covid-19 on 09/06/2020.

## 2020-10-10 NOTE — ED Provider Notes (Signed)
[facility] EMERGENCY DEPARTMENT Provider Note   CSN: [id number] Arrival date & time: 10/10/20  2211     History Chief Complaint  Patient presents with  . Fall  . Weakness    [name] is a 70 y.o. female.  Patient is a 70 year old female with extensive past medical history including diabetes, coronary artery disease with stenting, prior TIA, peripheral vascular disease, and recent hospitalization for COVID-19 pneumonia. She presents today for evaluation of weakness. Patient states she has been weak and has fallen on several occasions. This evening she attempted to get out of bed, then fell to the floor and was unable to get up. She had to call her son to come and help her. Patient tells me that since she was diagnosed with COVID, she has not had the strength she normally has. She denies to me that she is short of breath. She does describe burning with urination and pressure in the suprapubic region. She believes she has a UTI. She denies having struck her head or loss consciousness. She denies any injury in the fall.  The history is provided by the patient.  Fall This is a recurrent problem. The current episode started 1 to 2 hours ago. Nothing aggravates the symptoms. Nothing relieves the symptoms. She has tried nothing for the symptoms.       Past Medical History:  Diagnosis Date  . Anxiety   . CAD (coronary artery disease)    DES to circumflex 02/2007, BMS to LAD and PTCA diagonal 03/2007  . Carotid artery plaque    Mild  . Cataract   . Depression   . Diverticulitis, colon   . Elevated d-dimer 01/08/2014  . Essential hypertension, benign   . GERD (gastroesophageal reflux disease)   . H/O hiatal hernia   . HLD (hyperlipidemia)   . IDDM (insulin dependent diabetes mellitus)   . Migraine    "used to have them really bad; don't have them anymore" (01/07/2014)  . MS (multiple sclerosis) ([place])    Not confirmed  . PAT (paroxysmal atrial tachycardia) ([place])   .  Prolapse of uterus   . PVD (peripheral vascular disease) ([place])   . TIA (transient ischemic attack) 1980's    Patient Active Problem List   Diagnosis Date Noted  . Osteopenia after menopause 12/13/2017  . H/O prolonged Q-T interval on ECG 03/06/2017  . B12 deficiency 01/13/2016  . History of stroke 09/01/2015  . History of TIA (transient ischemic attack) 08/14/2014  . DM type 2 with diabetic dyslipidemia ([place]) 08/14/2014  . Cataract 02/08/2014  . Macular degeneration 02/08/2014  . Accelerating angina ([place]) 01/07/2014  . Generalized anxiety disorder 09/14/2013  . Claudication ([place]) 06/18/2013  . GERD (gastroesophageal reflux disease) 01/01/2012  . Uncontrolled type 2 diabetes mellitus with insulin therapy ([place]) 08/23/2011  . Essential hypertension 08/23/2011  . PALPITATIONS 02/10/2010  . Hyperlipidemia 05/10/2009  . Depression with anxiety 05/10/2009  . Coronary artery disease involving native coronary artery of native heart with angina pectoris ([place]) 08/26/2008    Past Surgical History:  Procedure Laterality Date  . ABDOMINAL HYSTERECTOMY  1986   ovaries remain - prolaspe uterus   . APPENDECTOMY  ~ 1970  . BREAST BIOPSY Right 1980's  . BREAST LUMPECTOMY Right 1980's   Dr. Charlynne Pander   . CARDIAC CATHETERIZATION  01/07/2014  . CHOLECYSTECTOMY  ?1987  . COLONOSCOPY  2002   Dr. Anwar--> Severe diverticular changes in the region of the sigmoid and descending colon with scattered  diverticular changes throughout the rest of the colon. No polyps, ulcerations. Despite numerous manipulations, the tip of the scope could not be tipped into the cecal area.  . COLONOSCOPY  01/10/2012   Procedure: COLONOSCOPY;  Surgeon: Daneil Dolin, MD;  Location: [facility];  Service: Endoscopy;  Laterality: N/A;  1:55  . CORONARY ANGIOPLASTY WITH STENT PLACEMENT  ~ 1997 X 2   "2 + 1"  . EYE SURGERY Bilateral 2014   cataract  . INTRAVASCULAR PRESSURE WIRE/FFR STUDY N/A 03/08/2017   Procedure:  Intravascular Pressure Wire/FFR Study;  Surgeon: Nelva Bush, MD;  Location: [facility] CV LAB;  Service: Cardiovascular;  Laterality: N/A;  . LEFT HEART CATH AND CORONARY ANGIOGRAPHY N/A 03/08/2017   Procedure: Left Heart Cath and Coronary Angiography;  Surgeon: Nelva Bush, MD;  Location: [facility] CV LAB;  Service: Cardiovascular;  Laterality: N/A;  . LEFT HEART CATHETERIZATION WITH CORONARY ANGIOGRAM N/A 01/07/2014   Procedure: LEFT HEART CATHETERIZATION WITH CORONARY ANGIOGRAM;  Surgeon: Larey Dresser, MD;  Location: [facility] CATH LAB;  Service: Cardiovascular;  Laterality: N/A;     OB History   No obstetric history on file.     Family History  Problem Relation Age of Onset  . Heart attack Mother 70  . Diabetes Mother   . Hypertension Mother   . Heart attack Father 49  . Heart attack Brother 32       x 6  . Heart disease Brother   . Diabetes Brother   . Colon cancer Paternal Aunt        35s, died with brain anuerysm  . Crohn's disease Cousin        paternal  . Diabetes Sister   . GER disease Daughter   . Cervical cancer Daughter   . Diabetes Daughter     Social History   Tobacco Use  . Smoking status: Never Smoker  . Smokeless tobacco: Never Used  . Tobacco comment: spouse, 69 years - husband has quit 01/2011  Vaping Use  . Vaping Use: Never used  Substance Use Topics  . Alcohol use: No  . Drug use: No    Home Medications Prior to Admission medications   Medication Sig Start Date End Date Taking? Authorizing Provider  amLODipine (NORVASC) 5 MG tablet TAKE ONE (1) TABLET EACH DAY 05/16/20   Dettinger, Fransisca Kaufmann, MD  aspirin 325 MG tablet Take 325 mg by mouth daily.    [provider]  atenolol (TENORMIN) 50 MG tablet Take 1 tablet (50 mg total) by mouth 2 (two) times daily. 01/13/19   Chipper Herb, MD  calcium carbonate (OS-CAL) 600 MG TABS tablet Take 1 tablet (600 mg total) by mouth 2 (two) times daily with a meal. 07/19/20   Gwenlyn Perking,  FNP  citalopram (CELEXA) 40 MG tablet TAKE ONE (1) TABLET EACH DAY 09/27/20   Dettinger, Fransisca Kaufmann, MD  clonazePAM (KLONOPIN) 0.5 MG tablet Take 1 tablet (0.5 mg total) by mouth at bedtime as needed for anxiety. 06/17/19   Dettinger, Fransisca Kaufmann, MD  Continuous Blood Gluc Sensor (FREESTYLE LIBRE 14 DAY SENSOR) MISC USE TO CHECK BLOOD GLUCOSE 12/21/19   Dettinger, Fransisca Kaufmann, MD  empagliflozin (JARDIANCE) 10 MG TABS tablet Take 10 mg by mouth daily. 02/20/19   Chipper Herb, MD  esomeprazole (NEXIUM) 40 MG capsule Take 1 capsule (40 mg total) by mouth daily. 03/17/20   Loman Brooklyn, FNP  FLUZONE HIGH-DOSE QUADRIVALENT 0.7 ML SUSY  06/25/20  [provider]  furosemide (LASIX) 20 MG tablet TAKE ONE (1) TABLET EACH DAY 09/27/20   Gwenlyn Perking, FNP  isosorbide mononitrate (IMDUR) 120 MG 24 hr tablet TAKE ONE (1) TABLET EACH DAY 03/22/20   Dettinger, Fransisca Kaufmann, MD  lisinopril (ZESTRIL) 40 MG tablet Take 1 tablet (40 mg total) by mouth daily. 01/13/19   Chipper Herb, MD  meclizine (ANTIVERT) 25 MG tablet Take 1 tablet (25 mg total) by mouth 3 (three) times daily as needed for dizziness. 11/26/19   Dettinger, Fransisca Kaufmann, MD  Misc. Devices (WALL GRAB BAR) MISC For bathtub/shower dx: M17.0, 173.9, Z91.8 05/12/19   Dettinger, Fransisca Kaufmann, MD  nitroGLYCERIN (NITROSTAT) 0.4 MG SL tablet DISSOLVE 1 TABLET UNDER TONGUE FOR CHESTPAIN.MAY REPEAT EVERY 5 MINUTES FOR 3 DOSES.IF NO RELIEF CALL 911 OR GO TO ER 06/08/20   Gwenlyn Perking, FNP  NOVOLOG FLEXPEN 100 UNIT/ML FlexPen INJECT 35-40 UNITS SQ 3 TIMES DAILY WITH MEALS Patient taking differently: 25 Units.  09/15/19   Dettinger, Fransisca Kaufmann, MD  ondansetron (ZOFRAN) 8 MG tablet Take 0.5 tablets (4 mg total) by mouth every 6 (six) hours as needed. 09/28/20   Gwenlyn Perking, FNP  potassium chloride SA (KLOR-CON) 20 MEQ tablet TAKE TWO TABLETS BY MOUTH DAILY 09/20/20   Gwenlyn Perking, FNP  rOPINIRole (REQUIP) 0.5 MG tablet Take 1 tablet (0.5 mg total) by mouth at  bedtime. 09/30/19   Dettinger, Fransisca Kaufmann, MD  rosuvastatin (CRESTOR) 40 MG tablet TAKE ONE (1) TABLET EACH DAY 04/14/20   Dettinger, Fransisca Kaufmann, MD  Semaglutide,0.25 or 0.5MG /DOS, (OZEMPIC, 0.25 OR 0.5 MG/DOSE,) 2 MG/1.5ML SOPN Inject 0.375 mLs (0.5 mg total) into the skin once a week. 04/20/20   Dettinger, Fransisca Kaufmann, MD  spironolactone (ALDACTONE) 25 MG tablet TAKE ONE (1) TABLET EACH DAY 09/27/20   Gwenlyn Perking, FNP  TOUJEO SOLOSTAR 300 UNIT/ML SOPN INJECT 50-60 UNITS SQ DAILY 09/15/19   Dettinger, Fransisca Kaufmann, MD  vitamin B-12 (CYANOCOBALAMIN) 1000 MCG tablet Take 1,000 mcg by mouth daily.    [provider]  Vitamin D, Ergocalciferol, (DRISDOL) 1.25 MG (50000 UNIT) CAPS capsule TAKE 1 CAPSULE EVERY 7 DAYS 07/19/20   Gwenlyn Perking, FNP    Allergies    Iohexol, Ticlid [ticlopidine hcl], and Codeine  Review of Systems   Review of Systems  All other systems reviewed and are negative.   Physical Exam Updated Vital Signs BP (!) 166/65 (BP Location: Left Arm)   Pulse 85   Temp 98.5 F (36.9 C) (Oral)   Resp 18   Ht 5\' 6"  (1.676 m)   Wt 70.8 kg   SpO2 97%   BMI 25.18 kg/m   Physical Exam Vitals and nursing note reviewed.  Constitutional:      General: She is not in acute distress.    Appearance: She is well-developed and well-nourished. She is not diaphoretic.  HENT:     Head: Normocephalic and atraumatic.     Mouth/Throat:     Mouth: Mucous membranes are moist.  Cardiovascular:     Rate and Rhythm: Normal rate and regular rhythm.     Heart sounds: No murmur heard. No friction rub. No gallop.   Pulmonary:     Effort: Pulmonary effort is normal. No respiratory distress.     Breath sounds: Normal breath sounds. No wheezing.  Abdominal:     General: Bowel sounds are normal. There is no distension.     Palpations: Abdomen is soft.  Tenderness: There is no abdominal tenderness.  Musculoskeletal:        General: No swelling or tenderness. Normal range of motion.      Cervical back: Normal range of motion and neck supple.     Right lower leg: No edema.     Left lower leg: No edema.  Skin:    General: Skin is warm and dry.  Neurological:     Mental Status: She is alert and oriented to person, place, and time.     ED Results / Procedures / Treatments   Labs (all labs ordered are listed, but only abnormal results are displayed) Labs Reviewed  COMPREHENSIVE METABOLIC PANEL  CBC WITH DIFFERENTIAL/PLATELET  URINALYSIS, ROUTINE W REFLEX MICROSCOPIC  CK  BRAIN NATRIURETIC PEPTIDE  TROPONIN I (HIGH SENSITIVITY)    EKG None  Radiology No results found.  Procedures Procedures (including critical care time)  Medications Ordered in ED Medications  sodium chloride 0.9 % bolus 500 mL (has no administration in time range)    ED Course  I have reviewed the triage vital signs and the nursing notes.  Pertinent labs & imaging results that were available during my care of the patient were reviewed by me and considered in my medical decision making (see chart for details).    MDM Rules/Calculators/A&P  Patient brought here for evaluation after a fall.  Patient describes attempting to ambulate this evening when her legs became weak and gave out on her.  She ended up on the floor unable to get back up.  She denies being injured.  This has happened 2 other times in the past week, and began after recent hospitalization for COVID-19 pneumonia.  Patient's work-up significant only for a urinary tract infection.  The remainder of her laboratory studies and vital signs are unremarkable.  Patient given IV Rocephin and will be admitted to the hospitalist service for further care.  Patient will likely need physical therapy and possibly a rehab facility.  It does not seem as though she is safe to return to her current environment.  Final Clinical Impression(s) / ED Diagnoses Final diagnoses:  None    Rx / DC Orders ED Discharge Orders    None       Veryl Speak, MD 10/11/20 [phone]

## 2020-10-11 ENCOUNTER — Telehealth: Payer: PPO | Admitting: Family Medicine

## 2020-10-11 DIAGNOSIS — N390 Urinary tract infection, site not specified: Secondary | ICD-10-CM | POA: Diagnosis present

## 2020-10-11 DIAGNOSIS — E1165 Type 2 diabetes mellitus with hyperglycemia: Secondary | ICD-10-CM

## 2020-10-11 DIAGNOSIS — R9431 Abnormal electrocardiogram [ECG] [EKG]: Secondary | ICD-10-CM

## 2020-10-11 DIAGNOSIS — R296 Repeated falls: Secondary | ICD-10-CM

## 2020-10-11 DIAGNOSIS — K219 Gastro-esophageal reflux disease without esophagitis: Secondary | ICD-10-CM | POA: Diagnosis not present

## 2020-10-11 DIAGNOSIS — E876 Hypokalemia: Secondary | ICD-10-CM

## 2020-10-11 DIAGNOSIS — Z8673 Personal history of transient ischemic attack (TIA), and cerebral infarction without residual deficits: Secondary | ICD-10-CM

## 2020-10-11 DIAGNOSIS — R531 Weakness: Secondary | ICD-10-CM | POA: Diagnosis not present

## 2020-10-11 DIAGNOSIS — E785 Hyperlipidemia, unspecified: Secondary | ICD-10-CM

## 2020-10-11 DIAGNOSIS — I10 Essential (primary) hypertension: Secondary | ICD-10-CM

## 2020-10-11 LAB — COMPREHENSIVE METABOLIC PANEL
ALT: 9 U/L (ref 0–44)
AST: 13 U/L — ABNORMAL LOW (ref 15–41)
Albumin: 3.5 g/dL (ref 3.5–5.0)
Alkaline Phosphatase: 54 U/L (ref 38–126)
Anion gap: 10 (ref 5–15)
BUN: 9 mg/dL (ref 8–23)
CO2: 26 mmol/L (ref 22–32)
Calcium: 9.1 mg/dL (ref 8.9–10.3)
Chloride: 100 mmol/L (ref 98–111)
Creatinine, Ser: 0.94 mg/dL (ref 0.44–1.00)
GFR, Estimated: >60 mL/min (ref >60)
Glucose, Bld: 271 mg/dL — ABNORMAL HIGH (ref 70–99)
Potassium: 3.3 mmol/L — ABNORMAL LOW (ref 3.5–5.1)
Sodium: 136 mmol/L (ref 135–145)
Total Bilirubin: 0.7 mg/dL (ref 0.3–1.2)
Total Protein: 7.7 g/dL (ref 6.5–8.1)

## 2020-10-11 LAB — CBC WITH DIFFERENTIAL/PLATELET
Abs Immature Granulocytes: 0.02 10*3/uL (ref 0.00–0.07)
Basophils Absolute: 0 10*3/uL (ref 0.0–0.1)
Basophils Relative: 0 %
Eosinophils Absolute: 0.1 10*3/uL (ref 0.0–0.5)
Eosinophils Relative: 1 %
HCT: 34 % — ABNORMAL LOW (ref 36.0–46.0)
Hemoglobin: 11 g/dL — ABNORMAL LOW (ref 12.0–15.0)
Immature Granulocytes: 0 %
Lymphocytes Relative: 20 %
Lymphs Abs: 1.8 10*3/uL (ref 0.7–4.0)
MCH: 29.1 pg (ref 26.0–34.0)
MCHC: 32.4 g/dL (ref 30.0–36.0)
MCV: 89.9 fL (ref 80.0–100.0)
Monocytes Absolute: 0.5 10*3/uL (ref 0.1–1.0)
Monocytes Relative: 6 %
Neutro Abs: 6.8 10*3/uL (ref 1.7–7.7)
Neutrophils Relative %: 73 %
Platelets: 178 10*3/uL (ref 150–400)
RBC: 3.78 MIL/uL — ABNORMAL LOW (ref 3.87–5.11)
RDW: 15.2 % (ref 11.5–15.5)
WBC: 9.2 10*3/uL (ref 4.0–10.5)
nRBC: 0 % (ref 0.0–0.2)

## 2020-10-11 LAB — GLUCOSE, CAPILLARY: Glucose-Capillary: 282 mg/dL — ABNORMAL HIGH (ref 70–99)

## 2020-10-11 LAB — PHOSPHORUS: Phosphorus: 3.4 mg/dL (ref 2.5–4.6)

## 2020-10-11 LAB — HEMOGLOBIN A1C
Hgb A1c MFr Bld: 8 % — ABNORMAL HIGH (ref 4.8–5.6)
Mean Plasma Glucose: 182.9 mg/dL

## 2020-10-11 LAB — URINALYSIS, ROUTINE W REFLEX MICROSCOPIC
Bilirubin Urine: NEGATIVE
Glucose, UA: >=500 mg/dL — AB
Ketones, ur: NEGATIVE mg/dL
Nitrite: NEGATIVE
Protein, ur: 30 mg/dL — AB
Specific Gravity, Urine: 1.01 (ref 1.005–1.030)
WBC, UA: >50 WBC/hpf — ABNORMAL HIGH (ref 0–5)
pH: 6 (ref 5.0–8.0)

## 2020-10-11 LAB — CK: Total CK: 78 U/L (ref 38–234)

## 2020-10-11 LAB — MAGNESIUM: Magnesium: 1.5 mg/dL — ABNORMAL LOW (ref 1.7–2.4)

## 2020-10-11 LAB — CBG MONITORING, ED
Glucose-Capillary: 257 mg/dL — ABNORMAL HIGH (ref 70–99)
Glucose-Capillary: 259 mg/dL — ABNORMAL HIGH (ref 70–99)

## 2020-10-11 LAB — BRAIN NATRIURETIC PEPTIDE: B Natriuretic Peptide: 70 pg/mL (ref 0.0–100.0)

## 2020-10-11 LAB — TROPONIN I (HIGH SENSITIVITY)
Troponin I (High Sensitivity): 5 ng/L (ref <18)
Troponin I (High Sensitivity): 7 ng/L (ref <18)

## 2020-10-11 MED ORDER — ASPIRIN 325 MG PO TABS
325.0000 mg | ORAL_TABLET | Freq: Every day | ORAL | Status: DC
  Administered 2020-10-11 – 2020-10-12 (×2): 325 mg via ORAL
  Filled 2020-10-11 (×2): qty 1

## 2020-10-11 MED ORDER — LISINOPRIL 10 MG PO TABS
40.0000 mg | ORAL_TABLET | Freq: Every day | ORAL | Status: DC
  Administered 2020-10-11 – 2020-10-12 (×2): 40 mg via ORAL
  Filled 2020-10-11 (×2): qty 4

## 2020-10-11 MED ORDER — INSULIN ASPART 100 UNIT/ML ~~LOC~~ SOLN
0.0000 [IU] | Freq: Three times a day (TID) | SUBCUTANEOUS | Status: DC
  Administered 2020-10-11 (×3): 8 [IU] via SUBCUTANEOUS
  Administered 2020-10-12: 5 [IU] via SUBCUTANEOUS
  Administered 2020-10-12 (×2): 8 [IU] via SUBCUTANEOUS
  Filled 2020-10-11 (×2): qty 1

## 2020-10-11 MED ORDER — ENOXAPARIN SODIUM 40 MG/0.4ML ~~LOC~~ SOLN
40.0000 mg | SUBCUTANEOUS | Status: DC
  Administered 2020-10-11 – 2020-10-12 (×2): 40 mg via SUBCUTANEOUS
  Filled 2020-10-11 (×2): qty 0.4

## 2020-10-11 MED ORDER — ATENOLOL 25 MG PO TABS
50.0000 mg | ORAL_TABLET | Freq: Two times a day (BID) | ORAL | Status: DC
  Administered 2020-10-11 – 2020-10-12 (×3): 50 mg via ORAL
  Filled 2020-10-11 (×3): qty 2

## 2020-10-11 MED ORDER — INSULIN GLARGINE 100 UNIT/ML ~~LOC~~ SOLN
14.0000 [IU] | Freq: Every day | SUBCUTANEOUS | Status: DC
  Administered 2020-10-11: 14 [IU] via SUBCUTANEOUS
  Filled 2020-10-11 (×2): qty 0.14

## 2020-10-11 MED ORDER — POTASSIUM CHLORIDE CRYS ER 20 MEQ PO TBCR
40.0000 meq | EXTENDED_RELEASE_TABLET | Freq: Every day | ORAL | Status: DC
  Administered 2020-10-11 – 2020-10-12 (×2): 40 meq via ORAL
  Filled 2020-10-11 (×2): qty 2

## 2020-10-11 MED ORDER — CALCIUM CARBONATE 600 MG PO TABS: 600.0000 mg | ORAL_TABLET | Freq: Two times a day (BID) | ORAL | Status: DC

## 2020-10-11 MED ORDER — MAGNESIUM SULFATE 2 GM/50ML IV SOLN
2.0000 g | Freq: Once | INTRAVENOUS | Status: AC
  Administered 2020-10-11: 2 g via INTRAVENOUS
  Filled 2020-10-11: qty 50

## 2020-10-11 MED ORDER — POTASSIUM CHLORIDE CRYS ER 20 MEQ PO TBCR
40.0000 meq | EXTENDED_RELEASE_TABLET | Freq: Once | ORAL | Status: AC
  Administered 2020-10-11: 40 meq via ORAL
  Filled 2020-10-11: qty 2

## 2020-10-11 MED ORDER — CALCIUM CARBONATE 1250 (500 CA) MG PO TABS
500.0000 mg | ORAL_TABLET | Freq: Two times a day (BID) | ORAL | Status: DC
  Administered 2020-10-11 – 2020-10-12 (×4): 500 mg via ORAL
  Filled 2020-10-11 (×6): qty 1

## 2020-10-11 MED ORDER — SODIUM CHLORIDE 0.9 % IV SOLN
1.0000 g | INTRAVENOUS | Status: DC
  Administered 2020-10-12: 1 g via INTRAVENOUS
  Filled 2020-10-11: qty 10

## 2020-10-11 MED ORDER — SODIUM CHLORIDE 0.9 % IV SOLN
1.0000 g | Freq: Once | INTRAVENOUS | Status: AC
  Administered 2020-10-11: 1 g via INTRAVENOUS
  Filled 2020-10-11: qty 10

## 2020-10-11 MED ORDER — INSULIN ASPART 100 UNIT/ML ~~LOC~~ SOLN
0.0000 [IU] | Freq: Every day | SUBCUTANEOUS | Status: DC
  Administered 2020-10-11: 3 [IU] via SUBCUTANEOUS

## 2020-10-11 MED ORDER — PANTOPRAZOLE SODIUM 40 MG PO TBEC
40.0000 mg | DELAYED_RELEASE_TABLET | Freq: Every day | ORAL | Status: DC
  Administered 2020-10-11 – 2020-10-12 (×2): 40 mg via ORAL
  Filled 2020-10-11 (×2): qty 1

## 2020-10-11 MED ORDER — NITROGLYCERIN 0.4 MG SL SUBL: 0.4000 mg | SUBLINGUAL_TABLET | SUBLINGUAL | Status: DC | PRN

## 2020-10-11 MED ORDER — ROSUVASTATIN CALCIUM 20 MG PO TABS
40.0000 mg | ORAL_TABLET | Freq: Every day | ORAL | Status: DC
  Administered 2020-10-11 – 2020-10-12 (×2): 40 mg via ORAL
  Filled 2020-10-11 (×2): qty 2

## 2020-10-11 NOTE — Progress Notes (Addendum)
Inpatient Diabetes Program Recommendations  AACE/ADA: New Consensus Statement on Inpatient Glycemic Control   Target Ranges:  Prepandial:   less than 140 mg/dL      Peak postprandial:   less than 180 mg/dL (1-2 hours)      Critically ill patients:  140 - 180 mg/dL   Results for [name], [name] (MRN [id number]) as of 10/11/2020 15:10  Ref. Range 10/11/2020 08:54 10/11/2020 11:58  Glucose-Capillary Latest Ref Range: 70 - 99 mg/dL 257 (H) 259 (H)  Results for [name], [name] (MRN [id number]) as of 10/11/2020 15:10  Ref. Range 10/10/2020 23:49  Hemoglobin A1C Latest Ref Range: 4.8 - 5.6 % 8.0 (H)   Review of Glycemic Control  Diabetes history: DM2 Outpatient Diabetes medications: Toujeo 30 units daily, Novolog 8-30 units TID with meals, Jardiance 10 mg daily, Ozempic 0.5 mg Qweek (Wednesday) Current orders for Inpatient glycemic control: Novolog 0-15 units TID with meals, Novolog 0-5 units QHS  Inpatient Diabetes Program Recommendations:    Insulin: Please consider ordering Lantus 14 units Q24H (based on 70.8 kg x 0.2 units).   NOTE: Spoke with patient about diabetes and home regimen for diabetes control. Patient reports being followed by PCP for diabetes management and currently taking  Toujeo 30 units daily, Novolog 8-30 units TID with meals, Jardiance 10 mg daily, and Ozempic 0.5 mg Qweek (Wednesday) as an outpatient for diabetes control.  Patient reports checking glucose 2-3 times per day and she reports that over the past few weeks it has been running higher than normal (reports up to 459 mg/dl in the past few days; but normally in upper 100's to low 200's).  Patient reports that she recently had COVID (dx 09/06/20) and ended up with pneumonia and UTI. Patient reports that she has not been eating well over the past month (due to nausea) but she was trying to stay hydrated and drink plenty of fluids. Patient states that she has lost about 15 pounds over the past month.  Discussed A1C results (8% on  10/10/20) and explained that current A1C indicates an average glucose of 183 mg/dl over the past 2-3 months. Patient reports that she thinks her last A1C was about the same.  Patient states that she had a follow up appointment scheduled today with PCP but she was here at the hospital and not able to go. Encouraged patient to call and reschedule follow up with PCP. Discussed current insulin orders and informed patient it would be requested that low dose basal insulin be ordered but it would be up to attending doctor to order if agreeable.  Patient verbalized understanding of information discussed and reports no further questions at this time related to diabetes.   Thanks, Barnie Alderman, RN, MSN, CDE Diabetes Coordinator Inpatient Diabetes Program [phone] (Team Pager from 8am to 5pm)

## 2020-10-11 NOTE — ED Notes (Signed)
Pt up to bedside commode with standby assist.

## 2020-10-11 NOTE — ED Notes (Signed)

## 2020-10-11 NOTE — Progress Notes (Signed)
Patient seen and evaluated, chart reviewed, please see EMR for updated orders. Please see full H&P dictated by admitting physician Dr Josephine Cables for same date of service.   --  Brief Summary:-  70 y.o. female with medical history significant for hypertension, insulin-dependent diabetes mellitus, coronary artery disease, depression with anxiety, history of TIA and reported history of recent COVID-19 pneumonia admitted on 10/11/2020 with UTI  A/p  1)UTI---POA-- patient with history of recurrent UTI, treated for Klebsiella UTI back in October 2021 -Empirically treated with [facility] for UTI on January 1st without culture--- as per patient UTI symptoms did not resolve despite completing Omnicef -She has been admitted for IV antibiotics after failing oral antibiotics prior to admission --- Continue IV Rocephin pending culture data  2)Generalized weakness/ambulatory dysfunction and recurrent falls----patient and her daughter Crystal--- patient has been weak and more steady ever since being diagnosed with COVID on 09/09/2020 -- Physical therapy eval appreciated recommends home health PT  3) hypokalemia and prolonged QT --- keep potassium close to 4, avoid QT prolonging agents  4)CAD and H/o TIA--no chest pain, no ACS type symptoms, no strokelike symptoms-- continue aspirin and Crestor  5)HTN--continue atenolol and lisinopril  6) recent COVID-19 infection--patient was vaccinated against COVID-19 previously tested positive for COVID-19 on 09/09/2020 -Chest x-ray with improving findings of COVID-pneumonia -No hypoxia at this time  -- Plan of care discussed with patient and her daughter Crystal at bedside, questions answered  -- Total Care Time 41 minutes  Roxan Hockey, MD

## 2020-10-11 NOTE — Evaluation (Signed)
Physical Therapy Evaluation Patient Details Name: [name] MRN: [id number] DOB: 02-Aug-1951 Today's Date: 10/11/2020   History of Present Illness  [name] is a 70 y.o. female with medical history significant for hypertension, insulin-dependent diabetes mellitus, coronary artery disease, depression with anxiety, history of TIA and reported history of recent COVID-19 pneumonia who presents to the emergency department via EMS due to generalized weakness.  Patient states that she has been generally weak and has not been able to regain her strength since she recovered from diagnosed COVID 19 virus pneumonia.  She states that she has fallen on several occasions and that when she attempted to get out of bed last evening (1/17), she fell to the floor and was unable to get up, so she called her son to come to help her, she denies hitting her head or any loss of consciousness, patient denies any injury from the sustained fall.  She lives alone and on arrival of the son, EMS was activated, patient was noted to be very weak, so she was taken to the ED for further evaluation.  Patient also endorsed burning sensation on urination and that she has been drinking cranberry juice without much improvement.  She denies fever, chills, chest pain, shortness of breath or abdominal pain.    Clinical Impression  Patient functioning near baseline for functional mobility and gait, other than having difficulty completing supine to sitting due to generalized weakness, demonstrates good return for ambulation in room and hallways without loss of balance.  Patient encouraged to ambulate ad lib in room and in hallway with nursing staff as tolerated for length of stay.  Patient will benefit from continued physical therapy in hospital and recommended venue below to increase strength, balance, endurance for safe ADLs and gait.     Follow Up Recommendations Home health PT;Supervision - Intermittent    Equipment  Recommendations  Other (comment) (Bed rail for bed at home)    Recommendations for Other Services       Precautions / Restrictions Precautions Precautions: Fall Restrictions Weight Bearing Restrictions: No      Mobility  Bed Mobility Overal bed mobility: Needs Assistance Bed Mobility: Supine to Sit;Sit to Supine     Supine to sit: Min assist;Min guard Sit to supine: Supervision;Modified independent (Device/Increase time)   General bed mobility comments: labored movement for sitting up at bedside requiring Min/Min guard assist    Transfers Overall transfer level: Needs assistance Equipment used: None Transfers: Sit to/from [employer] Transfers Sit to Stand: Supervision Stand pivot transfers: Supervision       General transfer comment: slightly labored movement, increased time  Ambulation/Gait Ambulation/Gait assistance: Supervision Gait Distance (Feet): 100 Feet Assistive device: None Gait Pattern/deviations: Decreased step length - right;Decreased step length - left;Decreased stride length Gait velocity: decreased   General Gait Details: slightly labored cadence without loss of balance, occasional leaning on nearby objects for support  Stairs            Wheelchair Mobility    Modified Rankin (Stroke Patients Only)       Balance Overall balance assessment: Mild deficits observed, not formally tested                                           Pertinent Vitals/Pain Pain Assessment: No/denies pain    Home Living Family/patient expects to be discharged to:: Private residence Living  Arrangements: Alone Available Help at Discharge: Neighbor;Available PRN/intermittently Type of Home: House Home Access: Ramped entrance     Home Layout: Two level;Bed/bath upstairs Home Equipment: Walker - 2 wheels;Cane - single point;Shower seat      Prior Function Level of Independence: Independent with assistive device(s)          Comments: Hydrographic surveyor using SPC PRN, freuqently lean on furniture at home, drives     Hand Dominance        Extremity/Trunk Assessment   Upper Extremity Assessment Upper Extremity Assessment: Generalized weakness    Lower Extremity Assessment Lower Extremity Assessment: Generalized weakness    Cervical / Trunk Assessment Cervical / Trunk Assessment: Normal  Communication   Communication: No difficulties  Cognition Arousal/Alertness: Awake/alert Behavior During Therapy: WFL for tasks assessed/performed Overall Cognitive Status: Within Functional Limits for tasks assessed                                        General Comments      Exercises     Assessment/Plan    PT Assessment Patient needs continued PT services  PT Problem List Decreased strength;Decreased activity tolerance;Decreased balance;Decreased mobility       PT Treatment Interventions DME instruction;Gait training;Stair training;Functional mobility training;Therapeutic activities;Therapeutic exercise;Balance training;Patient/family education    PT Goals (Current goals can be found in the Care Plan section)  Acute Rehab PT Goals Patient Stated Goal: return home with neighbor to assist PT Goal Formulation: With patient Time For Goal Achievement: 10/14/20 Potential to Achieve Goals: Good    Frequency Min 2X/week   Barriers to discharge        Co-evaluation               AM-PAC PT "6 Clicks" Mobility  Outcome Measure Help needed turning from your back to your side while in a flat bed without using bedrails?: None Help needed moving from lying on your back to sitting on the side of a flat bed without using bedrails?: A Little Help needed moving to and from a bed to a chair (including a wheelchair)?: A Little Help needed standing up from a chair using your arms (e.g., wheelchair or bedside chair)?: None Help needed to walk in hospital room?: A Little Help needed  climbing 3-5 steps with a railing? : A Little 6 Click Score: 20    End of Session   Activity Tolerance: Patient tolerated treatment well;Patient limited by fatigue Patient left: in bed;with call bell/phone within reach Nurse Communication: Mobility status PT Visit Diagnosis: Unsteadiness on feet (R26.81);Other abnormalities of gait and mobility (R26.89);Muscle weakness (generalized) (M62.81)    Time: 3244-0102 PT Time Calculation (min) (ACUTE ONLY): 20 min   Charges:   PT Evaluation $PT Eval Moderate Complexity: 1 Mod PT Treatments $Therapeutic Activity: 8-22 mins        10:26 AM, 10/11/20 Lonell Grandchild, MPT Physical Therapist with [facility] [phone] office [phone] mobile phone

## 2020-10-11 NOTE — Plan of Care (Signed)
  Problem: Acute Rehab PT Goals(only PT should resolve) Goal: Pt Will Go Supine/Side To Sit Outcome: Progressing Flowsheets (Taken 10/11/2020 1028) Pt will go Supine/Side to Sit:  with modified independence  with supervision Goal: Patient Will Transfer Sit To/From Stand Outcome: Progressing Flowsheets (Taken 10/11/2020 1028) Patient will transfer sit to/from stand: with modified independence Goal: Pt Will Transfer Bed To Chair/Chair To Bed Outcome: Progressing Flowsheets (Taken 10/11/2020 1028) Pt will Transfer Bed to Chair/Chair to Bed: with modified independence Goal: Pt Will Ambulate Outcome: Progressing Flowsheets (Taken 10/11/2020 1028) Pt will Ambulate:  > 125 feet  with modified independence  with least restrictive assistive device   10:28 AM, 10/11/20 Lonell Grandchild, MPT Physical Therapist with [facility] [phone] office [phone] mobile phone

## 2020-10-11 NOTE — H&P (Signed)
History and Physical  [name] [id number] DOB: April 22, 1951 DOA: 10/10/2020  Referring physician: Veryl Speak, MD PCP: Gwenlyn Perking, FNP  Patient coming from: Home  Chief Complaint: Generalized weakness and Fall  HPI: [name] is a 70 y.o. female with medical history significant for hypertension, insulin-dependent diabetes mellitus, coronary artery disease, depression with anxiety, history of TIA and reported history of recent COVID-19 pneumonia who presents to the emergency department via EMS due to generalized weakness.  Patient states that she has been generally weak and has not been able to regain her strength since she recovered from diagnosed COVID 19 virus pneumonia.  She states that she has fallen on several occasions and that when she attempted to get out of bed last evening (1/17), she fell to the floor and was unable to get up, so she called her son to come to help her, she denies hitting her head or any loss of consciousness, patient denies any injury from the sustained fall.  She lives alone and on arrival of the son, EMS was activated, patient was noted to be very weak, so she was taken to the ED for further evaluation. Patient also endorsed burning sensation on urination and that she has been drinking cranberry juice without much improvement.  She denies fever, chills, chest pain, shortness of breath or abdominal pain.  ED Course: In the emergency department, 166/65 and other vital signs were within normal range.  Work-up in the ED showed normocytic anemia, hypokalemia, hyperglycemia.  Urinalysis was positive for large leukocytes, WBC > 50, glucose >= 500. Chest x-ray showed Interval improvement but persistent multifocal airspace opacities in the setting of recent COVID-19 infection. Persistent nodular-like left base nodularity. IV ceftriaxone was given due to UTI.  IV hydration of 500 mL of NS was given.  Hospitalist was asked to admit patient for  further evaluation and management.  Review of Systems: Constitutional: Negative for chills and fever.  HENT: Negative for ear pain and sore throat.   Eyes: Negative for pain and visual disturbance.  Respiratory: Negative for cough, chest tightness and shortness of breath.   Cardiovascular: Negative for chest pain and palpitations.  Gastrointestinal: Negative for abdominal pain and vomiting.  Endocrine: Negative for polyphagia and polyuria.  Genitourinary: Positive for burning sensation on urination.  Negative for decreased urine volume, enuresis Musculoskeletal: Positive for recurrent falls.  Negative for arthralgias and back pain.  Skin: Negative for color change and rash.  Allergic/Immunologic: Negative for immunocompromised state.  Neurological: Positive for generalized weakness.  Negative for tremors, syncope, speech difficulty Hematological: Does not bruise/bleed easily.  All other systems reviewed and are negative  Past Medical History:  Diagnosis Date  . Anxiety   . CAD (coronary artery disease)    DES to circumflex 02/2007, BMS to LAD and PTCA diagonal 03/2007  . Carotid artery plaque    Mild  . Cataract   . Depression   . Diverticulitis, colon   . Elevated d-dimer 01/08/2014  . Essential hypertension, benign   . GERD (gastroesophageal reflux disease)   . H/O hiatal hernia   . HLD (hyperlipidemia)   . IDDM (insulin dependent diabetes mellitus)   . Migraine    "used to have them really bad; don't have them anymore" (01/07/2014)  . MS (multiple sclerosis) ([facility] [facility])    Not confirmed  . PAT (paroxysmal atrial tachycardia) ([place])   . Prolapse of uterus   . PVD (peripheral vascular disease) ([place])   . TIA (transient ischemic  attack) 1980's   Past Surgical History:  Procedure Laterality Date  . ABDOMINAL HYSTERECTOMY  1986   ovaries remain - prolaspe uterus   . APPENDECTOMY  ~ 1970  . BREAST BIOPSY Right 1980's  . BREAST LUMPECTOMY Right 1980's   Dr. Charlynne Pander   . CARDIAC  CATHETERIZATION  01/07/2014  . CHOLECYSTECTOMY  ?1987  . COLONOSCOPY  2002   Dr. Anwar--> Severe diverticular changes in the region of the sigmoid and descending colon with scattered diverticular changes throughout the rest of the colon. No polyps, ulcerations. Despite numerous manipulations, the tip of the scope could not be tipped into the cecal area.  . COLONOSCOPY  01/10/2012   Procedure: COLONOSCOPY;  Surgeon: Daneil Dolin, MD;  Location: [facility];  Service: Endoscopy;  Laterality: N/A;  1:55  . CORONARY ANGIOPLASTY WITH STENT PLACEMENT  ~ 1997 X 2   "2 + 1"  . EYE SURGERY Bilateral 2014   cataract  . INTRAVASCULAR PRESSURE WIRE/FFR STUDY N/A 03/08/2017   Procedure: Intravascular Pressure Wire/FFR Study;  Surgeon: Nelva Bush, MD;  Location: [facility];  Service: Cardiovascular;  Laterality: N/A;  . LEFT HEART CATH AND CORONARY ANGIOGRAPHY N/A 03/08/2017   Procedure: Left Heart Cath and Coronary Angiography;  Surgeon: Nelva Bush, MD;  Location: [facility];  Service: Cardiovascular;  Laterality: N/A;  . LEFT HEART CATHETERIZATION WITH CORONARY ANGIOGRAM N/A 01/07/2014   Procedure: LEFT HEART CATHETERIZATION WITH CORONARY ANGIOGRAM;  Surgeon: Larey Dresser, MD;  Location: [facility] CATH LAB;  Service: Cardiovascular;  Laterality: N/A;    Social History:  reports that she has never smoked. She has never used smokeless tobacco. She reports that she does not drink alcohol and does not use drugs.   Allergies  Allergen Reactions  . Iohexol      Desc: pt had syncopal episode with nausea post IV CM late 1990's,  pt has had prednisone prep with heart caths x 2 without problem  kdean 04/16/07, Onset Date: [id number]   . Ticlid [Ticlopidine Hcl] Nausea And Vomiting  . Codeine Nausea And Vomiting and Palpitations    Family History  Problem Relation Age of Onset  . Heart attack Mother 33  . Diabetes Mother   . Hypertension Mother   . Heart attack Father 37  . Heart  attack Brother 32       x 6  . Heart disease Brother   . Diabetes Brother   . Colon cancer Paternal Aunt        64s, died with brain anuerysm  . Crohn's disease Cousin        paternal  . Diabetes Sister   . GER disease Daughter   . Cervical cancer Daughter   . Diabetes Daughter      Prior to Admission medications   Medication Sig Start Date End Date Taking? Authorizing Provider  amLODipine (NORVASC) 5 MG tablet TAKE ONE (1) TABLET EACH DAY 05/16/20   Dettinger, Fransisca Kaufmann, MD  aspirin 325 MG tablet Take 325 mg by mouth daily.    [provider]  atenolol (TENORMIN) 50 MG tablet Take 1 tablet (50 mg total) by mouth 2 (two) times daily. 01/13/19   Chipper Herb, MD  calcium carbonate (OS-CAL) 600 MG TABS tablet Take 1 tablet (600 mg total) by mouth 2 (two) times daily with a meal. 07/19/20   Gwenlyn Perking, FNP  citalopram (CELEXA) 40 MG tablet TAKE ONE (1) TABLET EACH DAY 09/27/20   Dettinger,  Fransisca Kaufmann, MD  clonazePAM (KLONOPIN) 0.5 MG tablet Take 1 tablet (0.5 mg total) by mouth at bedtime as needed for anxiety. 06/17/19   Dettinger, Fransisca Kaufmann, MD  Continuous Blood Gluc Sensor (FREESTYLE LIBRE 14 DAY SENSOR) MISC USE TO CHECK BLOOD GLUCOSE 12/21/19   Dettinger, Fransisca Kaufmann, MD  empagliflozin (JARDIANCE) 10 MG TABS tablet Take 10 mg by mouth daily. 02/20/19   Chipper Herb, MD  esomeprazole (NEXIUM) 40 MG capsule Take 1 capsule (40 mg total) by mouth daily. 03/17/20   Loman Brooklyn, FNP  FLUZONE HIGH-DOSE QUADRIVALENT 0.7 ML SUSY  06/25/20   [provider]  furosemide (LASIX) 20 MG tablet TAKE ONE (1) TABLET EACH DAY 09/27/20   Gwenlyn Perking, FNP  isosorbide mononitrate (IMDUR) 120 MG 24 hr tablet TAKE ONE (1) TABLET EACH DAY 03/22/20   Dettinger, Fransisca Kaufmann, MD  lisinopril (ZESTRIL) 40 MG tablet Take 1 tablet (40 mg total) by mouth daily. 01/13/19   Chipper Herb, MD  meclizine (ANTIVERT) 25 MG tablet Take 1 tablet (25 mg total) by mouth 3 (three) times daily as needed for  dizziness. 11/26/19   Dettinger, Fransisca Kaufmann, MD  Misc. Devices (WALL GRAB BAR) MISC For bathtub/shower dx: M17.0, 173.9, Z91.8 05/12/19   Dettinger, Fransisca Kaufmann, MD  nitroGLYCERIN (NITROSTAT) 0.4 MG SL tablet DISSOLVE 1 TABLET UNDER TONGUE FOR CHESTPAIN.MAY REPEAT EVERY 5 MINUTES FOR 3 DOSES.IF NO RELIEF CALL 911 OR GO TO ER 06/08/20   Gwenlyn Perking, FNP  NOVOLOG FLEXPEN 100 UNIT/ML FlexPen INJECT 35-40 UNITS SQ 3 TIMES DAILY WITH MEALS Patient taking differently: 25 Units.  09/15/19   Dettinger, Fransisca Kaufmann, MD  ondansetron (ZOFRAN) 8 MG tablet Take 0.5 tablets (4 mg total) by mouth every 6 (six) hours as needed. 09/28/20   Gwenlyn Perking, FNP  potassium chloride SA (KLOR-CON) 20 MEQ tablet TAKE TWO TABLETS BY MOUTH DAILY 09/20/20   Gwenlyn Perking, FNP  rOPINIRole (REQUIP) 0.5 MG tablet Take 1 tablet (0.5 mg total) by mouth at bedtime. 09/30/19   Dettinger, Fransisca Kaufmann, MD  rosuvastatin (CRESTOR) 40 MG tablet TAKE ONE (1) TABLET EACH DAY 04/14/20   Dettinger, Fransisca Kaufmann, MD  Semaglutide,0.25 or 0.5MG /DOS, (OZEMPIC, 0.25 OR 0.5 MG/DOSE,) 2 MG/1.5ML SOPN Inject 0.375 mLs (0.5 mg total) into the skin once a week. 04/20/20   Dettinger, Fransisca Kaufmann, MD  spironolactone (ALDACTONE) 25 MG tablet TAKE ONE (1) TABLET EACH DAY 09/27/20   Gwenlyn Perking, FNP  TOUJEO SOLOSTAR 300 UNIT/ML SOPN INJECT 50-60 UNITS SQ DAILY 09/15/19   Dettinger, Fransisca Kaufmann, MD  vitamin B-12 (CYANOCOBALAMIN) 1000 MCG tablet Take 1,000 mcg by mouth daily.    [provider]  Vitamin D, Ergocalciferol, (DRISDOL) 1.25 MG (50000 UNIT) CAPS capsule TAKE 1 CAPSULE EVERY 7 DAYS 07/19/20   Gwenlyn Perking, FNP    Physical Exam: BP 121/80   Pulse 77   Temp 98.5 F (36.9 C) (Oral)   Resp 15   Ht 5\' 6"  (1.676 m)   Wt 70.8 kg   SpO2 96%   BMI 25.18 kg/m   . General: 70 y.o. year-old female well developed well nourished in no acute distress.  Alert and oriented x3. Marland Kitchen HEENT: NCAT, EOMI . Neck: Supple, trachea medial . Cardiovascular:  Regular rate and rhythm with no rubs or gallops.  No thyromegaly or JVD noted.  No lower extremity edema. 2/4 pulses in all 4 extremities. Marland Kitchen Respiratory: Clear to auscultation with no wheezes or rales.  Good inspiratory effort. . Abdomen: Soft nontender nondistended with normal bowel sounds x4 quadrants. . Muskuloskeletal: No cyanosis, clubbing or edema noted bilaterally . Neuro: CN II-XII intact, strength, sensation, reflexes . Skin: No ulcerative lesions noted or rashes . Psychiatry: Judgement and insight appear normal. Mood is appropriate for condition and setting          Labs on Admission:  Basic Metabolic Panel: Recent Labs  Lab 10/10/20 2349  NA 136  K 3.3*  CL 100  CO2 26  GLUCOSE 271*  BUN 9  CREATININE 0.94  CALCIUM 9.1   Liver Function Tests: Recent Labs  Lab 10/10/20 2349  AST 13*  ALT 9  ALKPHOS 54  BILITOT 0.7  PROT 7.7  ALBUMIN 3.5   No results for input(s): LIPASE, AMYLASE in the last 168 hours. No results for input(s): AMMONIA in the last 168 hours. CBC: Recent Labs  Lab 10/10/20 2349  WBC 9.2  NEUTROABS 6.8  HGB 11.0*  HCT 34.0*  MCV 89.9  PLT 178   Cardiac Enzymes: Recent Labs  Lab 10/10/20 2349  CKTOTAL 78    BNP (last 3 results) Recent Labs    10/10/20 2349  BNP 70.0    ProBNP (last 3 results) No results for input(s): PROBNP in the last 8760 hours.  CBG: No results for input(s): GLUCAP in the last 168 hours.  Radiological Exams on Admission: DG Chest Port 1 View  Result Date: 10/10/2020 CLINICAL DATA:  Weakness, ground level fall. EXAM: PORTABLE CHEST 1 VIEW COMPARISON:  Chest x-ray 04/08/2020, chest x-ray 09/24/2020. FINDINGS: The heart size and mediastinal contours are unchanged. Coronary artery stent. Interval improvement, but persistent, patchy airspace opacities. Redemonstration of a slightly nodular like density at the left base. No pulmonary edema. No pleural effusion. No pneumothorax. No acute osseous abnormality.  IMPRESSION: Interval improvement but persistent multifocal airspace opacities in the setting of recent COVID-19 infection. Persistent nodular-like left base nodularity. Followup PA and lateral chest X-ray is recommended in 3-4 weeks following therapy to ensure resolution and exclude underlying malignancy. Electronically Signed   By: Iven Finn M.D.   On: 10/10/2020 23:32    EKG: I independently viewed the EKG done and my findings are as followed: Normal sinus rhythm at a rate of 86 bpm with QTc (498 ms)  Assessment/Plan Present on Admission: . UTI (urinary tract infection) . Essential hypertension  Principal Problem:   Generalized weakness Active Problems:   Hyperglycemia due to diabetes mellitus (HCC)   Essential hypertension   Hypokalemia   UTI (urinary tract infection)   Recurrent falls   Prolonged QT interval   Recurrent falls possibly secondary to generalized weakness Reported history of recent COVID-19 viral pneumonia Continue fall precaution and neurochecks Continue PT/OT eval and treat Patient lives alone at home, she may need a SNF for rehab on discharge prior to returning home  UTI POA She was started on IV ceftriaxone, which will continue same at this time Urine culture pending  Hypokalemia in the setting of Prolonged QTc (498 ms) K+ 3.3, this will be replenished  Hyperglycemia secondary to type 2 diabetes mellitus Continue ISS and hypoglycemic protocol  Essential hypertension Continue atenolol and lisinopril  CAD Continue aspirin, Crestor Continue nitroglycerin as needed  History of TIA Continue aspirin, Crestor  GERD Continue Protonix  Hyperlipidemia Continue Crestor  DVT prophylaxis: Lovenox  Code Status: Full code  Family Communication: None at bedside  Disposition Plan:  Patient is from:  home Anticipated DC to:                   SNF or family members home Anticipated DC date:               2-3 days Anticipated  DC barriers:           Patient is unstable to be discharged at this time due to generalized weakness resulting in recurrent falls and UTI being treated with IV antibiotics  Consults called: None  Admission status: Inpatient  Bernadette Hoit MD [facility]  10/11/2020, 4:09 AM

## 2020-10-12 DIAGNOSIS — R531 Weakness: Secondary | ICD-10-CM

## 2020-10-12 LAB — COMPREHENSIVE METABOLIC PANEL
ALT: 8 U/L (ref 0–44)
AST: 11 U/L — ABNORMAL LOW (ref 15–41)
Albumin: 2.9 g/dL — ABNORMAL LOW (ref 3.5–5.0)
Alkaline Phosphatase: 45 U/L (ref 38–126)
Anion gap: 9 (ref 5–15)
BUN: 7 mg/dL — ABNORMAL LOW (ref 8–23)
CO2: 25 mmol/L (ref 22–32)
Calcium: 9 mg/dL (ref 8.9–10.3)
Chloride: 102 mmol/L (ref 98–111)
Creatinine, Ser: 0.79 mg/dL (ref 0.44–1.00)
GFR, Estimated: >60 mL/min (ref >60)
Glucose, Bld: 258 mg/dL — ABNORMAL HIGH (ref 70–99)
Potassium: 3.9 mmol/L (ref 3.5–5.1)
Sodium: 136 mmol/L (ref 135–145)
Total Bilirubin: 0.3 mg/dL (ref 0.3–1.2)
Total Protein: 6.6 g/dL (ref 6.5–8.1)

## 2020-10-12 LAB — CBC
HCT: 32.3 % — ABNORMAL LOW (ref 36.0–46.0)
Hemoglobin: 10.6 g/dL — ABNORMAL LOW (ref 12.0–15.0)
MCH: 29.5 pg (ref 26.0–34.0)
MCHC: 32.8 g/dL (ref 30.0–36.0)
MCV: 90 fL (ref 80.0–100.0)
Platelets: 162 10*3/uL (ref 150–400)
RBC: 3.59 MIL/uL — ABNORMAL LOW (ref 3.87–5.11)
RDW: 14.9 % (ref 11.5–15.5)
WBC: 7.1 10*3/uL (ref 4.0–10.5)
nRBC: 0 % (ref 0.0–0.2)

## 2020-10-12 LAB — GLUCOSE, CAPILLARY
Glucose-Capillary: 291 mg/dL — ABNORMAL HIGH (ref 70–99)
Glucose-Capillary: 277 mg/dL — ABNORMAL HIGH (ref 70–99)
Glucose-Capillary: 282 mg/dL — ABNORMAL HIGH (ref 70–99)
Glucose-Capillary: 239 mg/dL — ABNORMAL HIGH (ref 70–99)

## 2020-10-12 LAB — APTT: aPTT: 32 seconds (ref 24–36)

## 2020-10-12 LAB — PROTIME-INR
INR: 1.1 (ref 0.8–1.2)
Prothrombin Time: 13.9 seconds (ref 11.4–15.2)

## 2020-10-12 MED ORDER — CEFDINIR 300 MG PO CAPS: 300.0000 mg | ORAL_CAPSULE | Freq: Two times a day (BID) | ORAL | 0 refills | Status: AC

## 2020-10-12 MED ORDER — SODIUM CHLORIDE 0.9 % IV SOLN
1.0000 g | Freq: Once | INTRAVENOUS | Status: AC
  Administered 2020-10-12: 1 g via INTRAVENOUS
  Filled 2020-10-12: qty 10

## 2020-10-12 MED ORDER — AMLODIPINE BESYLATE 10 MG PO TABS
10.0000 mg | ORAL_TABLET | Freq: Every day | ORAL | 5 refills | Status: DC
Start: 2020-10-12 — End: 2022-03-26

## 2020-10-12 NOTE — Discharge Instructions (Signed)
1) please take the antibiotics as prescribed 2) please follow-up to primary care physician for recheck in about a week

## 2020-10-12 NOTE — Progress Notes (Signed)
Patient Demographics:    [name], is a 70 y.o. female, DOB - 11-Sep-1951, HQI:[id number]  Admit date - 10/10/2020   Admitting Physician Bernadette Hoit, DO  Outpatient Primary MD for the patient is Gwenlyn Perking, FNP  LOS - 1   Chief Complaint  Patient presents with  . Fall  . Weakness        Subjective:    [name] today has no fevers, no emesis,  No chest pain, -Dysuria improved, no CVA tenderness  Assessment  & Plan :    Principal Problem:   Generalized weakness Active Problems:   Hyperlipidemia   Hyperglycemia due to diabetes mellitus (HCC)   Essential hypertension   Hypokalemia   GERD (gastroesophageal reflux disease)   History of TIA (transient ischemic attack)   UTI (urinary tract infection)   Recurrent falls   Prolonged QT interval   Brief Summary:- 70 y.o.femalewith medical history significant forhypertension, insulin-dependent diabetes mellitus, coronary artery disease, depression with anxiety, history of TIAand reported history of recent COVID-19 pneumonia admitted on 10/11/2020 with UTI  A/p  1)UTI---POA-- patient with history of recurrent UTI, treated for Klebsiella UTI back in October 2021 -Empirically treated with [facility] for UTI on January 1st without culture--- as per patient UTI symptoms did not resolve despite completing Omnicef -She has been admitted for IV antibiotics after failing oral antibiotics prior to admission --- Continue IV Rocephin pending culture data (appears to have E. coli UTI at this time sensitivities are pending)  2)Generalized weakness/ambulatory dysfunction and recurrent falls----patient and her daughter Crystal--- patient has been weak and more steady ever since being diagnosed with COVID on 09/09/2020 -- Physical therapy eval appreciated recommends home health PT  3) hypokalemia and prolonged QT --- keep potassium close to 4,  avoid QT prolonging agents  4)CAD and H/o TIA--no chest pain, no ACS type symptoms, no strokelike symptoms-- continue aspirin and Crestor  5)HTN--continue atenolol and lisinopril  6) recent COVID-19 infection--patient was vaccinated against COVID-19 previously tested positive for COVID-19 on 09/09/2020 -Chest x-ray with improving findings of COVID-pneumonia -No hypoxia at this time  Disposition/Need for in-Hospital Stay- patient unable to be discharged at this time due to -anticipate discharge home in am on oral antibiotics once sensitivities available*  Status is: Inpatient  Remains inpatient appropriate because:Please see above  Disposition: The patient is from: Home              Anticipated d/c is to: Home              Anticipated d/c date is: 1 day              Patient currently is not medically stable to d/c. Barriers: Not Clinically Stable-   Code Status : -  Code Status: Full Code   Family Communication:   NA (patient is alert, awake and coherent)   Consults  :  na  DVT Prophylaxis  :   - SCDs   enoxaparin (LOVENOX) injection 40 mg Start: 10/11/20 1000 SCDs Start: 10/11/20 0247    Lab Results  Component Value Date   PLT 162 10/12/2020    Inpatient Medications  Scheduled Meds: . aspirin  325 mg Oral Daily  . atenolol  50 mg Oral BID  .  calcium carbonate  500 mg of elemental calcium Oral BID WC  . enoxaparin (LOVENOX) injection  40 mg Subcutaneous Q24H  . insulin aspart  0-15 Units Subcutaneous TID WC  . insulin aspart  0-5 Units Subcutaneous QHS  . insulin glargine  14 Units Subcutaneous QHS  . lisinopril  40 mg Oral Daily  . pantoprazole  40 mg Oral Daily  . potassium chloride SA  40 mEq Oral Daily  . rosuvastatin  40 mg Oral Daily   Continuous Infusions: . cefTRIAXone (ROCEPHIN)  IV 1 g (10/12/20 0113)   PRN Meds:.nitroGLYCERIN    Anti-infectives (From admission, onward)   Start     Dose/Rate Route Frequency Ordered Stop   10/12/20 0200   cefTRIAXone (ROCEPHIN) 1 g in sodium chloride 0.9 % 100 mL IVPB        1 g 200 mL/hr over 30 Minutes Intravenous Every 24 hours 10/11/20 0425 10/14/20 0159   10/11/20 0200  cefTRIAXone (ROCEPHIN) 1 g in sodium chloride 0.9 % 100 mL IVPB        1 g 200 mL/hr over 30 Minutes Intravenous  Once 10/11/20 0158 10/11/20 0327        Objective:   Vitals:   10/11/20 1500 10/11/20 2147 10/12/20 0546 10/12/20 1445  BP: (!) 159/65 (!) 178/55 (!) 157/62 (!) 150/55  Pulse: 73 84 73 72  Resp: 18 18 19 18   Temp: 98.1 F (36.7 C) 98.5 F (36.9 C) 98.2 F (36.8 C) 98.1 F (36.7 C)  TempSrc: Oral Oral Oral Oral  SpO2: 99% 97% 97% 98%  Weight: 72.6 kg     Height: 5\' 6"  (1.676 m)       Wt Readings from Last 3 Encounters:  10/11/20 72.6 kg  07/12/20 74.4 kg  05/31/20 73.2 kg     Intake/Output Summary (Last 24 hours) at 10/12/2020 1604 Last data filed at 10/12/2020 1300 Gross per 24 hour  Intake 480 ml  Output --  Net 480 ml     Physical Exam  Gen:- Awake Alert,  In no apparent distress  HEENT:- [place].AT, No sclera icterus Neck-Supple Neck,No JVD,.  Lungs-  CTAB , fair symmetrical air movement CV- S1, S2 normal, regular  Abd-  +ve B.Sounds, Abd Soft, No CVA  tenderness,    Extremity/Skin:- No  edema, pedal pulses present  Psych-affect is appropriate, oriented x3 Neuro-generalized weakness, no new focal deficits, no tremors   Data Review:   Micro Results Recent Results (from the past 240 hour(s))  Urine Culture     Status: Abnormal (Preliminary result)   Collection Time: 10/11/20  8:35 AM   Specimen: Urine, Clean Catch  Result Value Ref Range Status   Specimen Description   Final    URINE, CLEAN CATCH Performed at [facility], [address] [place], [place] [address]    Special Requests   Final    Normal Performed at [facility], [address] [place], [place] [address]    Culture 60,000 COLONIES/mL ESCHERICHIA COLI (A)  Final   Report Status PENDING  Incomplete     Radiology Reports CT Head Wo Contrast  Result Date: 09/24/2020 CLINICAL DATA:  Fall, hit head EXAM: CT HEAD WITHOUT CONTRAST TECHNIQUE: Contiguous axial images were obtained from the base of the skull through the vertex without intravenous contrast. COMPARISON:  09/21/2017 FINDINGS: Brain: There is atrophy and chronic small vessel disease changes. No acute intracranial abnormality. Specifically, no hemorrhage, hydrocephalus, mass lesion, acute infarction, or significant intracranial injury. Vascular: No hyperdense  vessel or unexpected calcification. Skull: No acute calvarial abnormality. Sinuses/Orbits: No acute finding Other: None IMPRESSION: Atrophy, chronic microvascular disease. No acute intracranial abnormality. Electronically Signed   By: Rolm Baptise M.D.   On: 09/24/2020 14:31   DG Chest Port 1 View  Result Date: 10/10/2020 CLINICAL DATA:  Weakness, ground level fall. EXAM: PORTABLE CHEST 1 VIEW COMPARISON:  Chest x-ray 04/08/2020, chest x-ray 09/24/2020. FINDINGS: The heart size and mediastinal contours are unchanged. Coronary artery stent. Interval improvement, but persistent, patchy airspace opacities. Redemonstration of a slightly nodular like density at the left base. No pulmonary edema. No pleural effusion. No pneumothorax. No acute osseous abnormality. IMPRESSION: Interval improvement but persistent multifocal airspace opacities in the setting of recent COVID-19 infection. Persistent nodular-like left base nodularity. Followup PA and lateral chest X-ray is recommended in 3-4 weeks following therapy to ensure resolution and exclude underlying malignancy. Electronically Signed   By: Iven Finn M.D.   On: 10/10/2020 23:32   DG Chest Portable 1 View  Result Date: 09/24/2020 CLINICAL DATA:  COVID, chest pain EXAM: PORTABLE CHEST 1 VIEW COMPARISON:  04/08/2020 FINDINGS: Heart is normal size. Patchy bilateral peripheral airspace opacities compatible with COVID pneumonia. No effusions or  pneumothorax. No acute bony abnormality. IMPRESSION: Patchy bilateral airspace opacities compatible with COVID pneumonia. Electronically Signed   By: Rolm Baptise M.D.   On: 09/24/2020 14:11     CBC Recent Labs  Lab 10/10/20 2349 10/12/20 0452  WBC 9.2 7.1  HGB 11.0* 10.6*  HCT 34.0* 32.3*  PLT 178 162  MCV 89.9 90.0  MCH 29.1 29.5  MCHC 32.4 32.8  RDW 15.2 14.9  LYMPHSABS 1.8  --   MONOABS 0.5  --   EOSABS 0.1  --   BASOSABS 0.0  --     Chemistries  Recent Labs  Lab 10/10/20 2349 10/11/20 0223 10/12/20 0452  NA 136  --  136  K 3.3*  --  3.9  CL 100  --  102  CO2 26  --  25  GLUCOSE 271*  --  258*  BUN 9  --  7*  CREATININE 0.94  --  0.79  CALCIUM 9.1  --  9.0  MG  --  1.5*  --   AST 13*  --  11*  ALT 9  --  8  ALKPHOS 54  --  45  BILITOT 0.7  --  0.3   ------------------------------------------------------------------------------------------------------------------ No results for input(s): CHOL, HDL, LDLCALC, TRIG, CHOLHDL, LDLDIRECT in the last 72 hours.  Lab Results  Component Value Date   HGBA1C 8.0 (H) 10/10/2020   ------------------------------------------------------------------------------------------------------------------ No results for input(s): TSH, T4TOTAL, T3FREE, THYROIDAB in the last 72 hours.  Invalid input(s): FREET3 ------------------------------------------------------------------------------------------------------------------ No results for input(s): VITAMINB12, FOLATE, FERRITIN, TIBC, IRON, RETICCTPCT in the last 72 hours.  Coagulation profile Recent Labs  Lab 10/12/20 0452  INR 1.1    No results for input(s): DDIMER in the last 72 hours.  Cardiac Enzymes No results for input(s): CKMB, TROPONINI, MYOGLOBIN in the last 168 hours.  Invalid input(s): CK ------------------------------------------------------------------------------------------------------------------    Component Value Date/Time   BNP 70.0 10/10/2020 2349      Jordyan Hardiman M.D on 10/12/2020 at 4:04 PM  Go to [email or web address] - for contact info  [facility] - Office  [phone]

## 2020-10-12 NOTE — Evaluation (Signed)
Occupational Therapy Evaluation Patient Details Name: GERLENE GLASSBURN MRN: [id number] DOB: 01-22-1951 Today's Date: 10/12/2020    History of Present Illness Florance Paolillo is a 70 y.o. female with medical history significant for hypertension, insulin-dependent diabetes mellitus, coronary artery disease, depression with anxiety, history of TIA and reported history of recent COVID-19 pneumonia who presents to the emergency department via EMS due to generalized weakness.  Patient states that she has been generally weak and has not been able to regain her strength since she recovered from diagnosed COVID 19 virus pneumonia.  She states that she has fallen on several occasions and that when she attempted to get out of bed last evening (1/17), she fell to the floor and was unable to get up, so she called her son to come to help her, she denies hitting her head or any loss of consciousness, patient denies any injury from the sustained fall.  She lives alone and on arrival of the son.  Patient also endorsed burning sensation on urination and that she has been drinking cranberry juice without much improvement.   Clinical Impression   Patient agreeable to OT eval. Presenting at baseline levels for grooming, functional ambulation, and transfer skills. No further OT services needed.     Follow Up Recommendations  No OT follow up    Equipment Recommendations  None recommended by OT       Precautions / Restrictions Precautions Precautions: Fall Restrictions Weight Bearing Restrictions: No      Mobility Bed Mobility Overal bed mobility: Modified Independent Bed Mobility: Supine to Sit     Supine to sit: Supervision          Transfers Overall transfer level: Modified independent Equipment used: None                      ADL either performed or assessed with clinical judgement   ADL Overall ADL's : Needs assistance/impaired     Grooming: Brushing hair;Oral  care;Supervision/safety Grooming Details (indicate cue type and reason): Standing at sink to brush hair and teeth.                             Functional mobility during ADLs: Supervision/safety General ADL Comments: Patient able to functionally ambulate from one end of hallway to the other with supervision.                  Pertinent Vitals/Pain Pain Assessment: No/denies pain     Hand Dominance Right   Extremity/Trunk Assessment Upper Extremity Assessment Upper Extremity Assessment: Overall WFL for tasks assessed              Cognition Arousal/Alertness: Awake/alert Behavior During Therapy: WFL for tasks assessed/performed                                                  Home Living Family/patient expects to be discharged to:: Private residence Living Arrangements: Other relatives;Children Available Help at Discharge: Neighbor;Available PRN/intermittently Type of Home: Apartment Home Access: Elevator     Home Layout: Two level;Full bath on main level Alternate Level Stairs-Number of Steps: elevator to 2nd floor   Bathroom Shower/Tub: Tub/shower unit   Bathroom Toilet: Handicapped height Bathroom Accessibility: Yes   Home Equipment: Environmental consultant - 2 wheels;Cane - single point;Shower  seat;Grab bars - toilet;Grab bars - tub/shower          Prior Functioning/Environment Level of Independence: Independent with assistive device(s)                                 End of Session Equipment Utilized During Treatment: Gait belt  Activity Tolerance: Patient tolerated treatment well Patient left: in chair;with call bell/phone within reach;with chair alarm set  OT Visit Diagnosis: Muscle weakness (generalized) (M62.81)                Time: 0388-8280 OT Time Calculation (min): 20 min Charges:  OT General Charges $OT Visit: 1 Visit OT Evaluation $OT Eval Low Complexity: 1 Low  Mysha Peeler OT, MOT   Larey Seat 10/12/2020, 9:05 AM

## 2020-10-12 NOTE — Discharge Summary (Signed)
[name], is a 70 y.o. female  DOB 1951-05-18  MRN [id number].  Admission date:  10/10/2020  Admitting Physician  Roxan Hockey, MD  Discharge Date:  10/12/2020   Primary MD  Gwenlyn Perking, FNP  Recommendations for primary care physician for things to follow:    1) please take the antibiotics as prescribed 2) please follow-up to primary care physician for recheck in about a week  Admission Diagnosis  UTI (urinary tract infection) [N39.0] Weakness [R53.1] Urinary tract infection without hematuria, site unspecified [N39.0]   Discharge Diagnosis  UTI (urinary tract infection) [N39.0] Weakness [R53.1] Urinary tract infection without hematuria, site unspecified [N39.0]    Principal Problem:   Generalized weakness Active Problems:   Hyperlipidemia   Hyperglycemia due to diabetes mellitus ([place])   Essential hypertension   Hypokalemia   GERD (gastroesophageal reflux disease)   History of TIA (transient ischemic attack)   UTI (urinary tract infection)   Recurrent falls   Prolonged QT interval      Past Medical History:  Diagnosis Date  . Anxiety   . CAD (coronary artery disease)    DES to circumflex 02/2007, BMS to LAD and PTCA diagonal 03/2007  . Carotid artery plaque    Mild  . Cataract   . Depression   . Diverticulitis, colon   . Elevated d-dimer 01/08/2014  . Essential hypertension, benign   . GERD (gastroesophageal reflux disease)   . H/O hiatal hernia   . HLD (hyperlipidemia)   . IDDM (insulin dependent diabetes mellitus)   . Migraine    "used to have them really bad; don't have them anymore" (01/07/2014)  . MS (multiple sclerosis) ([place])    Not confirmed  . PAT (paroxysmal atrial tachycardia) ([place])   . Prolapse of uterus   . PVD (peripheral vascular disease) ([place])   . TIA (transient ischemic attack) 1980's    Past Surgical History:  Procedure Laterality Date  .  ABDOMINAL HYSTERECTOMY  1986   ovaries remain - prolaspe uterus   . APPENDECTOMY  ~ 1970  . BREAST BIOPSY Right 1980's  . BREAST LUMPECTOMY Right 1980's   Dr. Charlynne Pander   . CARDIAC CATHETERIZATION  01/07/2014  . CHOLECYSTECTOMY  ?1987  . COLONOSCOPY  2002   Dr. Anwar--> Severe diverticular changes in the region of the sigmoid and descending colon with scattered diverticular changes throughout the rest of the colon. No polyps, ulcerations. Despite numerous manipulations, the tip of the scope could not be tipped into the cecal area.  . COLONOSCOPY  01/10/2012   Procedure: COLONOSCOPY;  Surgeon: Daneil Dolin, MD;  [facility];  Service: Endoscopy;  Laterality: N/A;  1:55  . CORONARY ANGIOPLASTY WITH STENT PLACEMENT  ~ 1997 X 2   "2 + 1"  . EYE SURGERY Bilateral 2014   cataract  . INTRAVASCULAR PRESSURE WIRE/FFR STUDY N/A 03/08/2017   Procedure: Intravascular Pressure Wire/FFR Study;  Surgeon: Nelva Bush, MD;  Location: [place] CV LAB;  Service: Cardiovascular;  Laterality: N/A;  .  LEFT HEART CATH AND CORONARY ANGIOGRAPHY N/A 03/08/2017   Procedure: Left Heart Cath and Coronary Angiography;  Surgeon: Nelva Bush, MD;  Location: [place] CV LAB;  Service: Cardiovascular;  Laterality: N/A;  . LEFT HEART CATHETERIZATION WITH CORONARY ANGIOGRAM N/A 01/07/2014   Procedure: LEFT HEART CATHETERIZATION WITH CORONARY ANGIOGRAM;  Surgeon: Larey Dresser, MD;  Location: [facility] CATH LAB;  Service: Cardiovascular;  Laterality: N/A;       HPI  from the history and physical done on the day of admission:    Chief Complaint: Generalized weakness and Fall  HPI: [name] is a 70 y.o. female with medical history significant for hypertension, insulin-dependent diabetes mellitus, coronary artery disease, depression with anxiety, history of TIA and reported history of recent COVID-19 pneumonia who presents to the emergency department via EMS due to generalized weakness.   Patient states that she has been generally weak and has not been able to regain her strength since she recovered from diagnosed COVID 19 virus pneumonia.  She states that she has fallen on several occasions and that when she attempted to get out of bed last evening (1/17), she fell to the floor and was unable to get up, so she called her son to come to help her, she denies hitting her head or any loss of consciousness, patient denies any injury from the sustained fall.  She lives alone and on arrival of the son, EMS was activated, patient was noted to be very weak, so she was taken to the ED for further evaluation. Patient also endorsed burning sensation on urination and that she has been drinking cranberry juice without much improvement.  She denies fever, chills, chest pain, shortness of breath or abdominal pain.  ED Course: In the emergency department, 166/65 and other vital signs were within normal range.  Work-up in the ED showed normocytic anemia, hypokalemia, hyperglycemia.  Urinalysis was positive for large leukocytes, WBC > 50, glucose >= 500. Chest x-ray showed Interval improvement but persistent multifocal airspace opacities in the setting of recent COVID-19 infection. Persistent nodular-like left base nodularity. IV ceftriaxone was given due to UTI.  IV hydration of 500 mL of NS was given.  Hospitalist was asked to admit patient for further evaluation and management.     Hospital Course:     Brief Summary:- 70 y.o.femalewith medical history significant forhypertension, insulin-dependent diabetes mellitus, coronary artery disease, depression with anxiety, history of TIAand reported history of recent COVID-19 pneumoniaadmitted on 10/11/2020 with UTI  A/p  1) E. coli UTI---POA--patient with history of recurrent UTI,treated for Klebsiella UTI back in October 2021 -Empirically [facility] with [facility] for UTI on January 1stwithout culture---as per patient UTI symptoms did not resolve  despite completing [place] -She has been admitted for IV antibiotics after failing oral antibiotics prior to admission -Treated with IV Rocephine -Discharged on Omnicef  2)Generalized weakness/ambulatory dysfunction and recurrent falls----patient and her daughter Crystal---patient has been weak and more steady ever since being diagnosed with COVID on 09/09/2020 --Physical therapy eval appreciated recommends home health PT  3)hypokalemia and prolonged QT ---keep potassium close to 4, avoid QT prolonging agents -Hypokalemia resolved  4)CAD and H/o TIA--no chest pain, no ACS type symptoms, no strokelike symptoms--continue aspirin and Crestor  5)HTN--continue atenolol and lisinopril, increase amlodipine to 10 mg daily for better BP control  6)recent COVID-19 infection--patient was vaccinated against COVID-19 previously tested positive for COVID-19 on 09/09/2020 -Chest x-ray with improving findings of COVID-pneumonia -No hypoxia at this time  Disposition/Need for in-Hospital  Stay- patient unable to be discharged at this time due to -anticipate discharge home in am on oral antibiotics once sensitivities available*   Discharge Condition: stable  Follow UP   Follow-up Information    Care, [facility] Follow up.   Specialty: Home Health Services Why: [facility] home health staff will call you to schedule in home physical therapy visits Contact information: [place] [address] [phone]        Gwenlyn Perking, FNP. Schedule an appointment as soon as possible for a visit in 1 week(s).   Specialty: Family Medicine Contact information: [place] [address] [phone]                Diet and Activity recommendation:  As advised  Discharge Instructions    Discharge Instructions    Call MD for:  difficulty breathing, headache or visual disturbances   Complete by: As directed    Call MD for:  persistant dizziness  or light-headedness   Complete by: As directed    Call MD for:  persistant nausea and vomiting   Complete by: As directed    Call MD for:  temperature >100.4   Complete by: As directed    Diet - low sodium heart healthy   Complete by: As directed    Diet Carb Modified   Complete by: As directed    Discharge instructions   Complete by: As directed    1) please take the antibiotics as prescribed 2) please follow-up to primary care physician for recheck in about a week   Increase activity slowly   Complete by: As directed         Discharge Medications     Allergies as of 10/12/2020      Reactions   Iohexol     Desc: pt had syncopal episode with nausea post IV CM late 1990's,  pt has had prednisone prep with heart caths x 2 without problem  kdean 04/16/07, Onset Date: [id number]   Ticlid [ticlopidine Hcl] Nausea And Vomiting   Codeine Nausea And Vomiting, Palpitations      Medication List    TAKE these medications   amLODipine 10 MG tablet Commonly known as: NORVASC Take 1 tablet (10 mg total) by mouth daily. What changed:   medication strength  See the new instructions.   aspirin 325 MG tablet Take 325 mg by mouth daily.   atenolol 50 MG tablet Commonly known as: TENORMIN Take 1 tablet (50 mg total) by mouth 2 (two) times daily.   calcium carbonate 600 MG Tabs tablet Commonly known as: OS-CAL Take 1 tablet (600 mg total) by mouth 2 (two) times daily with a meal.   cefdinir 300 MG capsule Commonly known as: OMNICEF Take 1 capsule (300 mg total) by mouth 2 (two) times daily for 3 days. Start taking on: October 13, 2020   citalopram 40 MG tablet Commonly known as: CELEXA TAKE ONE (1) TABLET EACH DAY   clonazePAM 0.5 MG tablet Commonly known as: KLONOPIN Take 1 tablet (0.5 mg total) by mouth at bedtime as needed for anxiety.   empagliflozin 10 MG Tabs tablet Commonly known as: Jardiance Take 10 mg by mouth daily.   esomeprazole 40 MG capsule Commonly known  as: NexIUM Take 1 capsule (40 mg total) by mouth daily.   Fluzone High-Dose Quadrivalent 0.7 ML Susy Generic drug: Influenza Vac High-Dose Quad   furosemide 20 MG tablet Commonly known as: LASIX TAKE ONE (  1) TABLET EACH DAY   isosorbide mononitrate 120 MG 24 hr tablet Commonly known as: IMDUR TAKE ONE (1) TABLET EACH DAY   lisinopril 40 MG tablet Commonly known as: ZESTRIL Take 1 tablet (40 mg total) by mouth daily.   meclizine 25 MG tablet Commonly known as: ANTIVERT Take 1 tablet (25 mg total) by mouth 3 (three) times daily as needed for dizziness.   nitroGLYCERIN 0.4 MG SL tablet Commonly known as: NITROSTAT DISSOLVE 1 TABLET UNDER TONGUE FOR CHESTPAIN.MAY REPEAT EVERY 5 MINUTES FOR 3 DOSES.IF NO RELIEF CALL 911 OR GO TO ER   NovoLOG FlexPen 100 UNIT/ML FlexPen Generic drug: insulin aspart INJECT 35-40 UNITS SQ 3 TIMES DAILY WITH MEALS What changed: See the new instructions.   ondansetron 8 MG tablet Commonly known as: ZOFRAN Take 0.5 tablets (4 mg total) by mouth every 6 (six) hours as needed.   Ozempic (0.25 or 0.5 MG/DOSE) 2 MG/1.5ML Sopn Generic drug: Semaglutide(0.25 or 0.5MG /DOS) Inject 0.375 mLs (0.5 mg total) into the skin once a week.   potassium chloride SA 20 MEQ tablet Commonly known as: KLOR-CON TAKE TWO TABLETS BY MOUTH DAILY   rOPINIRole 0.5 MG tablet Commonly known as: Requip Take 1 tablet (0.5 mg total) by mouth at bedtime.   rosuvastatin 40 MG tablet Commonly known as: CRESTOR TAKE ONE (1) TABLET EACH DAY   spironolactone 25 MG tablet Commonly known as: ALDACTONE TAKE ONE (1) TABLET EACH DAY   Toujeo SoloStar 300 UNIT/ML Solostar Pen Generic drug: insulin glargine (1 Unit Dial) INJECT 50-60 UNITS SQ DAILY What changed: See the new instructions.   vitamin B-12 1000 MCG tablet Commonly known as: CYANOCOBALAMIN Take 1,000 mcg by mouth daily.   Vitamin D (Ergocalciferol) 1.25 MG (50000 UNIT) Caps capsule Commonly known as:  DRISDOL TAKE 1 CAPSULE EVERY 7 DAYS      Major procedures and Radiology Reports - PLEASE review detailed and final reports for all details, in brief -   CT Head Wo Contrast  Result Date: 09/24/2020 CLINICAL DATA:  Fall, hit head EXAM: CT HEAD WITHOUT CONTRAST TECHNIQUE: Contiguous axial images were obtained from the base of the skull through the vertex without intravenous contrast. COMPARISON:  09/21/2017 FINDINGS: Brain: There is atrophy and chronic small vessel disease changes. No acute intracranial abnormality. Specifically, no hemorrhage, hydrocephalus, mass lesion, acute infarction, or significant intracranial injury. Vascular: No hyperdense vessel or unexpected calcification. Skull: No acute calvarial abnormality. Sinuses/Orbits: No acute finding Other: None IMPRESSION: Atrophy, chronic microvascular disease. No acute intracranial abnormality. Electronically Signed   By: Rolm Baptise M.D.   On: 09/24/2020 14:31   DG Chest Port 1 View  Result Date: 10/10/2020 CLINICAL DATA:  Weakness, ground level fall. EXAM: PORTABLE CHEST 1 VIEW COMPARISON:  Chest x-ray 04/08/2020, chest x-ray 09/24/2020. FINDINGS: The heart size and mediastinal contours are unchanged. Coronary artery stent. Interval improvement, but persistent, patchy airspace opacities. Redemonstration of a slightly nodular like density at the left base. No pulmonary edema. No pleural effusion. No pneumothorax. No acute osseous abnormality. IMPRESSION: Interval improvement but persistent multifocal airspace opacities in the setting of recent COVID-19 infection. Persistent nodular-like left base nodularity. Followup PA and lateral chest X-ray is recommended in 3-4 weeks following therapy to ensure resolution and exclude underlying malignancy. Electronically Signed   By: Iven Finn M.D.   On: 10/10/2020 23:32   DG Chest Portable 1 View  Result Date: 09/24/2020 CLINICAL DATA:  COVID, chest pain EXAM: PORTABLE CHEST 1 VIEW COMPARISON:   04/08/2020 FINDINGS:  Heart is normal size. Patchy bilateral peripheral airspace opacities compatible with COVID pneumonia. No effusions or pneumothorax. No acute bony abnormality. IMPRESSION: Patchy bilateral airspace opacities compatible with COVID pneumonia. Electronically Signed   By: Rolm Baptise M.D.   On: 09/24/2020 14:11    Micro Results    Recent Results (from the past 240 hour(s))  Urine Culture     Status: Abnormal (Preliminary result)   Collection Time: 10/11/20  8:35 AM   Specimen: Urine, Clean Catch  Result Value Ref Range Status   Specimen Description   Final    URINE, CLEAN CATCH Performed at [facility], [address] [place], [place] [address]    Special Requests   Final    Normal Performed at [facility], [address] [place], [place] [address]    Culture 60,000 COLONIES/mL [place] (A)  Final   Report Status PENDING  Incomplete    Today   Subjective    [name] today has no new complaints,  no further dysuria No fever  Or chills   No Nausea, Vomiting or Diarrhea   Patient has been seen and examined prior to discharge   Objective   Blood pressure (!) 150/55, pulse 72, temperature 98.1 F (36.7 C), temperature source Oral, resp. rate 18, height 5\' 6"  (1.676 m), weight 72.6 kg, SpO2 98 %.   Intake/Output Summary (Last 24 hours) at 10/12/2020 1830 Last data filed at 10/12/2020 1700 Gross per 24 hour  Intake 720 ml  Output -  Net 720 ml    Exam Gen:- Awake Alert,  In no apparent distress  HEENT:- [place].AT, No sclera icterus Neck-Supple Neck,No JVD,.  Lungs-  CTAB , fair symmetrical air movement CV- S1, S2 normal, regular  Abd-  +ve B.Sounds, Abd Soft, No CVA  tenderness,    Extremity/Skin:- No  edema, pedal pulses present  Psych-affect is appropriate, oriented x3 Neuro-generalized weakness, no new focal deficits, no tremors   Data Review   CBC w Diff:  Lab Results  Component Value Date   WBC 7.1 10/12/2020   HGB 10.6 (L)  10/12/2020   HGB 10.9 (L) 07/13/2020   HCT 32.3 (L) 10/12/2020   HCT 33.6 (L) 07/13/2020   PLT 162 10/12/2020   PLT 233 07/13/2020   LYMPHOPCT 20 10/10/2020   MONOPCT 6 10/10/2020   EOSPCT 1 10/10/2020   BASOPCT 0 10/10/2020    CMP:  Lab Results  Component Value Date   NA 136 10/12/2020   NA 138 07/13/2020   K 3.9 10/12/2020   CL 102 10/12/2020   CO2 25 10/12/2020   BUN 7 (L) 10/12/2020   BUN 7 (L) 07/13/2020   CREATININE 0.79 10/12/2020   CREATININE 1.19 (H) 01/05/2014   PROT 6.6 10/12/2020   PROT 6.9 07/13/2020   ALBUMIN 2.9 (L) 10/12/2020   ALBUMIN 3.8 07/13/2020   BILITOT 0.3 10/12/2020   BILITOT 0.3 07/13/2020   ALKPHOS 45 10/12/2020   AST 11 (L) 10/12/2020   ALT 8 10/12/2020  . Total Discharge time is about 33 minutes  Roxan Hockey M.D on 10/12/2020 at 6:30 PM  Go to [email or web address] -  for contact info  [facility] - Office  [phone]

## 2020-10-12 NOTE — Care Management Obs Status (Signed)
[place] NOTIFICATION   Patient Details  Name: [name] MRN: [id number] Date of Birth: 01-Mar-1951   Medicare Observation Status Notification Given:  Yes    Iona Beard, Latanya Presser 10/12/2020, 6:21 PM

## 2020-10-12 NOTE — TOC Transition Note (Signed)
Transition of Care [facility]) - CM/SW Discharge Note   Patient Details  Name: [name] MRN: [id number] Date of Birth: Sep 19, 1951  [facility] of [facility] CM/SW Contact:  Shade Flood, LCSW Phone Number: 10/12/2020, 10:50 AM   Clinical Narrative:     Pt stable for dc home today per MD. Damaris Schooner with pt for dc planning needs. Pt will return to her apartment home in [place]. Pt very excited to be going home. Discussed PT recommendation for [facility] PT and pt agreeable. Reviewed CMS and in-network insurance options with pt and referred as requested. [facility] Tommi Rumps accepted the referral and they will follow up with pt to schedule in home visits. This information was added to the AVS.  There are no other TOC needs identified for dc.  Final next level of care: [place] Barriers to Discharge: Barriers Resolved   Patient Goals and CMS Choice Patient states their goals for this hospitalization and ongoing recovery are:: get home CMS Medicare.gov Compare Post Acute Care list provided to:: Patient Choice offered to / list presented to : Patient  Discharge Placement                       Discharge Plan and Services In-house Referral: Clinical Social Work   Post Acute Care Choice: Home Health                    HH Arranged: PT [facility] Agency: [place] Date [place]: 10/12/20 Time [place] Agency Contacted: 1050 Representative spoke with at [place]: [place] ([place]) Interventions     Readmission Risk Interventions Readmission Risk Prevention Plan 10/12/2020  Transportation Screening Complete  Home Care Screening Complete  Medication Review (RN CM) Complete  Some recent data might be hidden

## 2020-10-12 NOTE — Care Management CC44 (Signed)
Condition Code 44 Documentation Completed  Patient Details  Name: TACOYA ALTIZER MRN: [id number] Date of Birth: [birth year]   Condition Code 44 given:  Yes Patient signature on Condition Code 44 notice:  Yes Documentation of 2 MD's agreement:  Yes Code 44 added to claim:  Yes    Iona Beard, [place] 10/12/2020, 6:21 PM

## 2020-10-12 NOTE — Progress Notes (Signed)
Inpatient Diabetes Program Recommendations  AACE/ADA: New Consensus Statement on Inpatient Glycemic Control   Target Ranges:  Prepandial:   less than 140 mg/dL      Peak postprandial:   less than 180 mg/dL (1-2 hours)      Critically ill patients:  140 - 180 mg/dL   Results for [name], [name] (MRN [id number]) as of 10/12/2020 08:44  Ref. Range 10/11/2020 08:54 10/11/2020 11:58 10/11/2020 16:02 10/12/2020 07:17  Glucose-Capillary Latest Ref Range: 70 - 99 mg/dL 257 (H) 259 (H) 282 (H) 277 (H)   Review of Glycemic Control  Diabetes history: DM2 Outpatient Diabetes medications: Toujeo 30 units daily, Novolog 8-30 units TID with meals, Jardiance 10 mg daily, Ozempic 0.5 mg Qweek (Wednesday) Current orders for Inpatient glycemic control: Lantus 14 units QHS, Novolog 0-15 units TID with meals, Novolog 0-5 units QHS  Inpatient Diabetes Program Recommendations:    Insulin: Please consider increasing Lantus to 20 units QHS and ordering  Novolog 4 units TID with meals for meal coverage if patient eats at least 50% of meals.  Thanks, Barnie Alderman, RN, MSN, CDE Diabetes Coordinator Inpatient Diabetes Program [phone] (Team Pager from 8am to 5pm)

## 2020-10-13 ENCOUNTER — Telehealth: Payer: Self-pay | Admitting: *Deleted

## 2020-10-13 LAB — URINE CULTURE
Culture: 60000 — AB
Special Requests: NORMAL

## 2020-10-13 NOTE — Telephone Encounter (Signed)
Contact Date: 10/13/2020 Contacted By: Truett Mainland, LPN  Transition Care Management Follow-up Telephone Call  Date of discharge and from where: 10/12/20 [facility]   Discharge Diagnosis: Weakness & UTI  How have you been since you were released from the hospital? Anxious  Any questions or concerns? Yes   Items Reviewed:  Did the pt receive and understand the discharge instructions provided? Yes   Medications obtained and verified? Yes   Any new allergies since your discharge? No   Dietary orders reviewed? No  Do you have support at home? Yes   Discontinued Medications None New Medications Added Omnicef 300 mg  Current Medication List Allergies as of 10/13/2020      Reactions   Iohexol     Desc: pt had syncopal episode with nausea post IV CM late 1990's,  pt has had prednisone prep with heart caths x 2 without problem  kdean 04/16/07, Onset Date: [id number]   Ticlid [ticlopidine Hcl] Nausea And Vomiting   Codeine Nausea And Vomiting, Palpitations      Medication List       Accurate as of October 13, 2020 12:06 PM. If you have any questions, ask your nurse or doctor.        amLODipine 10 MG tablet Commonly known as: NORVASC Take 1 tablet (10 mg total) by mouth daily.   aspirin 325 MG tablet Take 325 mg by mouth daily.   atenolol 50 MG tablet Commonly known as: TENORMIN Take 1 tablet (50 mg total) by mouth 2 (two) times daily.   calcium carbonate 600 MG Tabs tablet Commonly known as: OS-CAL Take 1 tablet (600 mg total) by mouth 2 (two) times daily with a meal.   cefdinir 300 MG capsule Commonly known as: OMNICEF Take 1 capsule (300 mg total) by mouth 2 (two) times daily for 3 days.   citalopram 40 MG tablet Commonly known as: CELEXA TAKE ONE (1) TABLET EACH DAY   clonazePAM 0.5 MG tablet Commonly known as: KLONOPIN Take 1 tablet (0.5 mg total) by mouth at bedtime as needed for anxiety.   empagliflozin 10 MG Tabs tablet Commonly known as:  Jardiance Take 10 mg by mouth daily.   esomeprazole 40 MG capsule Commonly known as: NexIUM Take 1 capsule (40 mg total) by mouth daily.   Fluzone High-Dose Quadrivalent 0.7 ML Susy Generic drug: Influenza Vac High-Dose Quad   furosemide 20 MG tablet Commonly known as: LASIX TAKE ONE (1) TABLET EACH DAY   isosorbide mononitrate 120 MG 24 hr tablet Commonly known as: IMDUR TAKE ONE (1) TABLET EACH DAY   lisinopril 40 MG tablet Commonly known as: ZESTRIL Take 1 tablet (40 mg total) by mouth daily.   meclizine 25 MG tablet Commonly known as: ANTIVERT Take 1 tablet (25 mg total) by mouth 3 (three) times daily as needed for dizziness.   nitroGLYCERIN 0.4 MG SL tablet Commonly known as: NITROSTAT DISSOLVE 1 TABLET UNDER TONGUE FOR CHESTPAIN.MAY REPEAT EVERY 5 MINUTES FOR 3 DOSES.IF NO RELIEF CALL 911 OR GO TO ER   NovoLOG FlexPen 100 UNIT/ML FlexPen Generic drug: insulin aspart INJECT 35-40 UNITS SQ 3 TIMES DAILY WITH MEALS What changed: See the new instructions.   ondansetron 8 MG tablet Commonly known as: ZOFRAN Take 0.5 tablets (4 mg total) by mouth every 6 (six) hours as needed.   Ozempic (0.25 or 0.5 MG/DOSE) 2 MG/1.5ML Sopn Generic drug: Semaglutide(0.25 or 0.5MG /DOS) Inject 0.375 mLs (0.5 mg total) into the skin once a week.   potassium  chloride SA 20 MEQ tablet Commonly known as: KLOR-CON TAKE TWO TABLETS BY MOUTH DAILY   rOPINIRole 0.5 MG tablet Commonly known as: Requip Take 1 tablet (0.5 mg total) by mouth at bedtime.   rosuvastatin 40 MG tablet Commonly known as: CRESTOR TAKE ONE (1) TABLET EACH DAY   spironolactone 25 MG tablet Commonly known as: ALDACTONE TAKE ONE (1) TABLET EACH DAY   Toujeo SoloStar 300 UNIT/ML Solostar Pen Generic drug: insulin glargine (1 Unit Dial) INJECT 50-60 UNITS SQ DAILY What changed: See the new instructions.   vitamin B-12 1000 MCG tablet Commonly known as: CYANOCOBALAMIN Take 1,000 mcg by mouth daily.   Vitamin  D (Ergocalciferol) 1.25 MG (50000 UNIT) Caps capsule Commonly known as: DRISDOL TAKE 1 CAPSULE EVERY 7 DAYS        Home Care and Equipment/Supplies: Were home health services ordered? yes If so, what is the name of the agency? [facility]  Has the agency set up a time to come to the patient's home? yes Were any new equipment or medical supplies ordered?  No What is the name of the medical supply agency? [facility] Were you able to get the supplies/equipment? not applicable Do you have any questions related to the use of the equipment or supplies? Yes: Patient is wanting to have a walk in shower ordered and lower bed  Functional Questionnaire: (I = Independent and D = Dependent) ADLs: I   Bathing/Dressing- I  Meal Prep- I  Eating- I  Maintaining continence- I  Transferring/Ambulation- I WITH WALKER  Managing Meds- I  Follow up appointments reviewed:   PCP Hospital f/u appt confirmed? Yes  Scheduled to see Marjorie Smolder, FNP on Monday 10/17/2020 @ 1:30PM.  [facility] f/u appt confirmed? No    Are transportation arrangements needed? No   If their condition worsens, is the pt aware to call PCP or go to the Emergency Dept.? Yes  Was the patient provided with contact information for the PCP's office or ED? Yes  Was to pt encouraged to call back with questions or concerns? Yes

## 2020-10-17 ENCOUNTER — Ambulatory Visit (INDEPENDENT_AMBULATORY_CARE_PROVIDER_SITE_OTHER): Payer: PPO | Admitting: Family Medicine

## 2020-10-17 ENCOUNTER — Other Ambulatory Visit: Payer: Self-pay

## 2020-10-17 ENCOUNTER — Encounter: Payer: Self-pay | Admitting: Family Medicine

## 2020-10-17 VITALS — BP 136/78 | HR 109 | Temp 98.4°F | Ht 66.0 in | Wt 158.2 lb

## 2020-10-17 DIAGNOSIS — B379 Candidiasis, unspecified: Secondary | ICD-10-CM | POA: Diagnosis not present

## 2020-10-17 DIAGNOSIS — N3000 Acute cystitis without hematuria: Secondary | ICD-10-CM

## 2020-10-17 DIAGNOSIS — I25119 Atherosclerotic heart disease of native coronary artery with unspecified angina pectoris: Secondary | ICD-10-CM | POA: Diagnosis not present

## 2020-10-17 DIAGNOSIS — R531 Weakness: Secondary | ICD-10-CM

## 2020-10-17 DIAGNOSIS — R296 Repeated falls: Secondary | ICD-10-CM | POA: Diagnosis not present

## 2020-10-17 DIAGNOSIS — Z87898 Personal history of other specified conditions: Secondary | ICD-10-CM | POA: Diagnosis not present

## 2020-10-17 DIAGNOSIS — Z09 Encounter for follow-up examination after completed treatment for conditions other than malignant neoplasm: Secondary | ICD-10-CM

## 2020-10-17 DIAGNOSIS — R0602 Shortness of breath: Secondary | ICD-10-CM | POA: Diagnosis not present

## 2020-10-17 DIAGNOSIS — R300 Dysuria: Secondary | ICD-10-CM | POA: Diagnosis not present

## 2020-10-17 LAB — URINALYSIS, COMPLETE
Bilirubin, UA: NEGATIVE
Ketones, UA: NEGATIVE
Nitrite, UA: NEGATIVE
Protein,UA: NEGATIVE
Specific Gravity, UA: 1.01 (ref 1.005–1.030)
Urobilinogen, Ur: 2 mg/dL — ABNORMAL HIGH (ref 0.2–1.0)
pH, UA: 5.5 (ref 5.0–7.5)

## 2020-10-17 LAB — MICROSCOPIC EXAMINATION: Epithelial Cells (non renal): NONE SEEN /hpf (ref 0–10)

## 2020-10-17 MED ORDER — CEPHALEXIN 500 MG PO CAPS: 500.0000 mg | ORAL_CAPSULE | Freq: Two times a day (BID) | ORAL | 0 refills | Status: AC

## 2020-10-17 MED ORDER — FLUCONAZOLE 150 MG PO TABS: ORAL_TABLET | ORAL | 0 refills | Status: DC

## 2020-10-17 NOTE — Progress Notes (Addendum)
Established Patient Office Visit  Subjective:  Patient ID: [name], female    DOB: 09-26-1950  Age: 70 y.o. MRN: [id number]  CC:  Chief Complaint  Patient presents with  . Transitions Of Care    UTI-      HPI [name] presents for a transitions of care hospital follow up for a UTI.    1. Hospital follow up Concha was admitted on 10/10/20 for UTI and weakness and was discharged on 10/12/20. TOC call was made on 10/13/20 by Truett Mainland, LPN. She has completed the omnicef. She reports some some dysuria. She denies fever nausea, vomiting, abdominal pain, or flank pain. She ordered a lower bed. PT has been ordered for her as well.   2. Fall She fell yesterday and hit her head on a wooden stool. Denies LOC. She was not seen in the ED for this. She denies changes in balance in gait, one sided weakness, headache, changes in vision, nausea, or vomiting.   3. SOB She was diagnosed with Covid in December. She was diagnosed and treated for Covid pneumonia on 09/24/20 in the ED. Her shortness of breath is improving. She reports SOB only when she goes outside now. She feels like the cold takes her breath for a minute. She is able to get up and move around her apartment without dyspnea. She had a CXR on 10/10/20 that showed improving Covid pneumonia with recommended repeat in 3-4 weeks. She denies cough.   3. Cardiac history She has a history of CAD, CHF, and prolonged QT. She denies chest pain, edema, orthopnea, or cough. She used to be seen by a cardiologist but not within the last few years.   Past Medical History:  Diagnosis Date  . Anxiety   . CAD (coronary artery disease)    DES to circumflex 02/2007, BMS to LAD and PTCA diagonal 03/2007  . Carotid artery plaque    Mild  . Cataract   . Depression   . Diverticulitis, colon   . Elevated d-dimer 01/08/2014  . Essential hypertension, benign   . GERD (gastroesophageal reflux disease)   . H/O hiatal hernia   . HLD  (hyperlipidemia)   . IDDM (insulin dependent diabetes mellitus)   . Migraine    "used to have them really bad; don't have them anymore" (01/07/2014)  . MS (multiple sclerosis) ([place])    Not confirmed  . PAT (paroxysmal atrial tachycardia) ([place])   . Prolapse of uterus   . PVD (peripheral vascular disease) ([place])   . TIA (transient ischemic attack) 1980's    Past Surgical History:  Procedure Laterality Date  . ABDOMINAL HYSTERECTOMY  1986   ovaries remain - prolaspe uterus   . APPENDECTOMY  ~ 1970  . BREAST BIOPSY Right 1980's  . BREAST LUMPECTOMY Right 1980's   Dr. Charlynne Pander   . CARDIAC CATHETERIZATION  01/07/2014  . CHOLECYSTECTOMY  ?1987  . COLONOSCOPY  2002   Dr. Anwar--> Severe diverticular changes in the region of the sigmoid and descending colon with scattered diverticular changes throughout the rest of the colon. No polyps, ulcerations. Despite numerous manipulations, the tip of the scope could not be tipped into the cecal area.  . COLONOSCOPY  01/10/2012   Procedure: COLONOSCOPY;  Surgeon: Daneil Dolin, MD;  Location: [facility];  Service: Endoscopy;  Laterality: N/A;  1:55  . CORONARY ANGIOPLASTY WITH STENT PLACEMENT  ~ 1997 X 2   "2 + 1"  . EYE SURGERY  Bilateral 2014   cataract  . INTRAVASCULAR PRESSURE WIRE/FFR STUDY N/A 03/08/2017   Procedure: Intravascular Pressure Wire/FFR Study;  Surgeon: Nelva Bush, MD;  Location: [facility] [facility];  Service: Cardiovascular;  Laterality: N/A;  . LEFT HEART CATH AND CORONARY ANGIOGRAPHY N/A 03/08/2017   Procedure: Left Heart Cath and Coronary Angiography;  Surgeon: Nelva Bush, MD;  Location: [place] [facility];  Service: Cardiovascular;  Laterality: N/A;  . LEFT HEART CATHETERIZATION WITH CORONARY ANGIOGRAM N/A 01/07/2014   Procedure: LEFT HEART CATHETERIZATION WITH CORONARY ANGIOGRAM;  Surgeon: Larey Dresser, MD;  Location: [facility] CATH LAB;  Service: Cardiovascular;  Laterality: N/A;    Family History  Problem Relation Age  of Onset  . Heart attack Mother 4  . Diabetes Mother   . Hypertension Mother   . Heart attack Father 48  . Heart attack Brother 32       x 6  . Heart disease Brother   . Diabetes Brother   . Colon cancer Paternal Aunt        15s, died with brain anuerysm  . Crohn's disease Cousin        paternal  . Diabetes Sister   . GER disease Daughter   . Cervical cancer Daughter   . Diabetes Daughter     Social History   Socioeconomic History  . Marital status: Widowed    Spouse name: Not on file  . Number of children: 4  . Years of education: 105  . Highest education level: 11th grade  Occupational History  . Occupation: Disability    Employer: DISABLED  Tobacco Use  . Smoking status: Never Smoker  . Smokeless tobacco: Never Used  . Tobacco comment: spouse, 34 years - husband has quit 01/2011  Vaping Use  . Vaping Use: Never used  Substance and Sexual Activity  . Alcohol use: No  . Drug use: No  . Sexual activity: Not Currently  Other Topics Concern  . Not on file  Social History Narrative  . Not on file   Social Determinants of Health   Financial Resource Strain: Not on file  Food Insecurity: Not on file  Transportation Needs: Not on file  Physical Activity: Not on file  Stress: Not on file  Social Connections: Not on file  Intimate Partner Violence: Not on file    Outpatient Medications Prior to Visit  Medication Sig Dispense Refill  . amLODipine (NORVASC) 10 MG tablet Take 1 tablet (10 mg total) by mouth daily. 30 tablet 5  . aspirin 325 MG tablet Take 325 mg by mouth daily.    Marland Kitchen atenolol (TENORMIN) 50 MG tablet Take 1 tablet (50 mg total) by mouth 2 (two) times daily. 180 tablet 3  . calcium carbonate (OS-CAL) 600 MG TABS tablet Take 1 tablet (600 mg total) by mouth 2 (two) times daily with a meal. 180 tablet 3  . citalopram (CELEXA) 40 MG tablet TAKE ONE (1) TABLET EACH DAY 90 tablet 0  . clonazePAM (KLONOPIN) 0.5 MG tablet Take 1 tablet (0.5 mg total) by  mouth at bedtime as needed for anxiety. 30 tablet 2  . empagliflozin (JARDIANCE) 10 MG TABS tablet Take 10 mg by mouth daily. 30 tablet 3  . esomeprazole (NEXIUM) 40 MG capsule Take 1 capsule (40 mg total) by mouth daily. 30 capsule 3  . FLUZONE HIGH-DOSE QUADRIVALENT 0.7 ML SUSY     . furosemide (LASIX) 20 MG tablet TAKE ONE (1) TABLET EACH DAY 30 tablet 0  .  isosorbide mononitrate (IMDUR) 120 MG 24 hr tablet TAKE ONE (1) TABLET EACH DAY 90 tablet 1  . lisinopril (ZESTRIL) 40 MG tablet Take 1 tablet (40 mg total) by mouth daily. 90 tablet 3  . meclizine (ANTIVERT) 25 MG tablet Take 1 tablet (25 mg total) by mouth 3 (three) times daily as needed for dizziness. 90 tablet 3  . nitroGLYCERIN (NITROSTAT) 0.4 MG SL tablet DISSOLVE 1 TABLET UNDER TONGUE FOR CHESTPAIN.MAY REPEAT EVERY 5 MINUTES FOR 3 DOSES.IF NO RELIEF CALL 911 OR GO TO ER 25 tablet 6  . NOVOLOG FLEXPEN 100 UNIT/ML FlexPen INJECT 35-40 UNITS SQ 3 TIMES DAILY WITH MEALS (Patient taking differently: 8-30 Units. Takes 8-30 units TID with meals depending on glucose) 30 mL 3  . ondansetron (ZOFRAN) 8 MG tablet Take 0.5 tablets (4 mg total) by mouth every 6 (six) hours as needed. 60 tablet 0  . potassium chloride SA (KLOR-CON) 20 MEQ tablet TAKE TWO TABLETS BY MOUTH DAILY 180 tablet 3  . rOPINIRole (REQUIP) 0.5 MG tablet Take 1 tablet (0.5 mg total) by mouth at bedtime. 90 tablet 3  . rosuvastatin (CRESTOR) 40 MG tablet TAKE ONE (1) TABLET EACH DAY 90 tablet 3  . Semaglutide,0.25 or 0.5MG/DOS, (OZEMPIC, 0.25 OR 0.5 MG/DOSE,) 2 MG/1.5ML SOPN Inject 0.375 mLs (0.5 mg total) into the skin once a week. 1.5 mL 11  . spironolactone (ALDACTONE) 25 MG tablet TAKE ONE (1) TABLET EACH DAY 30 tablet 0  . TOUJEO SOLOSTAR 300 UNIT/ML SOPN INJECT 50-60 UNITS SQ DAILY (Patient taking differently: 30 Units daily.) 9 mL 2  . vitamin B-12 (CYANOCOBALAMIN) 1000 MCG tablet Take 1,000 mcg by mouth daily.    . Vitamin D, Ergocalciferol, (DRISDOL) 1.25 MG (50000  UNIT) CAPS capsule TAKE 1 CAPSULE EVERY 7 DAYS 12 capsule 3   Facility-Administered Medications Prior to Visit  Medication Dose Route Frequency Provider Last Rate Last Admin  . methylPREDNISolone acetate (DEPO-MEDROL) injection 80 mg  80 mg Intramuscular Once Dettinger, Fransisca Kaufmann, MD        Allergies  Allergen Reactions  . Iohexol      Desc: pt had syncopal episode with nausea post IV CM late 1990's,  pt has had prednisone prep with heart caths x 2 without problem  kdean 04/16/07, Onset Date: [id number]   . Ticlid [Ticlopidine Hcl] Nausea And Vomiting  . Codeine Nausea And Vomiting and Palpitations    ROS Review of Systems  As per HPI.    Objective:    Physical Exam Vitals and nursing note reviewed.  Constitutional:      General: She is not in acute distress.    Appearance: Normal appearance.  HENT:     Head: Normocephalic.     Nose: Nose normal.     Mouth/Throat:     Mouth: Mucous membranes are moist.     Pharynx: Oropharynx is clear.  Eyes:     Extraocular Movements: Extraocular movements intact.     Conjunctiva/sclera: Conjunctivae normal.     Pupils: Pupils are equal, round, and reactive to light.  Cardiovascular:     Rate and Rhythm: Normal rate and regular rhythm.     Heart sounds: Normal heart sounds. No murmur heard.   Pulmonary:     Effort: Pulmonary effort is normal. No respiratory distress.     Breath sounds: Normal breath sounds. No wheezing, rhonchi or rales.  Abdominal:     General: Bowel sounds are normal. There is no distension.     Palpations: Abdomen is  soft.     Tenderness: There is no abdominal tenderness. There is no right CVA tenderness, left CVA tenderness, guarding or rebound.  Musculoskeletal:     Right lower leg: No edema.     Left lower leg: No edema.  Skin:    General: Skin is warm and dry.  Neurological:     Mental Status: She is alert and oriented to person, place, and time.     Cranial Nerves: No cranial nerve deficit.     Sensory:  No sensory deficit.     Motor: Weakness (generalized) present.     Coordination: Coordination normal.     Gait: Gait abnormal (uses cane).     Deep Tendon Reflexes: Reflexes normal.  Psychiatric:        Mood and Affect: Mood normal.        Behavior: Behavior normal.     BP 136/78   Pulse (!) 109   Temp 98.4 F (36.9 C) (Temporal)   Ht 5' 6"  (1.676 m)   Wt 158 lb 3.2 oz (71.8 kg)   SpO2 95%   BMI 25.53 kg/m  Wt Readings from Last 3 Encounters:  10/17/20 158 lb 3.2 oz (71.8 kg)  10/11/20 160 lb (72.6 kg)  07/12/20 164 lb 2 oz (74.4 kg)   Urine dipstick shows positive for RBC's, positive for glucose and positive for leukocytes.  Micro exam: 6-10 WBC's per HPF, 0-2 RBC's per HPF, yeast, and few+ bacteria.   Health Maintenance Due  Topic Date Due  . FOOT EXAM  09/27/2020  . OPHTHALMOLOGY EXAM  09/27/2020    There are no preventive care reminders to display for this patient.  Lab Results  Component Value Date   TSH 1.950 04/23/2018   Lab Results  Component Value Date   WBC 7.1 10/12/2020   HGB 10.6 (L) 10/12/2020   HCT 32.3 (L) 10/12/2020   MCV 90.0 10/12/2020   PLT 162 10/12/2020   Lab Results  Component Value Date   NA 136 10/12/2020   K 3.9 10/12/2020   CO2 25 10/12/2020   GLUCOSE 258 (H) 10/12/2020   BUN 7 (L) 10/12/2020   CREATININE 0.79 10/12/2020   BILITOT 0.3 10/12/2020   ALKPHOS 45 10/12/2020   AST 11 (L) 10/12/2020   ALT 8 10/12/2020   PROT 6.6 10/12/2020   ALBUMIN 2.9 (L) 10/12/2020   CALCIUM 9.0 10/12/2020   ANIONGAP 9 10/12/2020   Lab Results  Component Value Date   CHOL 128 03/02/2020   Lab Results  Component Value Date   HDL 34 (L) 03/02/2020   Lab Results  Component Value Date   LDLCALC 68 03/02/2020   Lab Results  Component Value Date   TRIG 146 03/02/2020   Lab Results  Component Value Date   CHOLHDL 3.8 03/02/2020   Lab Results  Component Value Date   HGBA1C 8.0 (H) 10/10/2020      Assessment & Plan:   Andreka was  seen today for transitions of care.  Diagnoses and all orders for this visit:  Acute cystitis without hematuria UA indicates infection. Start Keflex. Return to office for new or worsening symptoms, or if symptoms persist.  -     Urinalysis, Complete -     cephALEXin (KEFLEX) 500 MG capsule; Take 1 capsule (500 mg total) by mouth 2 (two) times daily for 7 days.  Yeast infection Yeast on UA. Diflucan as below. Return to office for new or worsening symptoms, or if symptoms persist.  -  fluconazole (DIFLUCAN) 150 MG tablet; Take 1 tablet now. May take another tablet after finishing antibiotic.  Shortness of breath Improving from Covid pneumonia. Follow up in 2 weeks for repeat CXR, sooner for new or worsening symptoms.   Generalized weakness Secondary to CHF, CAD, recent UTI. Home PT ordered. Sliding shower bench order as she struggles to get her legs up and over the tub. Labs pending as below.  -     For home use only DME Other see comment -     CBC with Differential/Platelet -     CMP14+EGFR -     Ambulatory referral for home health.   Frequent falls Patient declined CT scan for fall yesterday. Normal neuro exam today. Discussed when to seek emergency care. Home PT ordered      -      Ambulatory referral for home health -     For home use only DME Other see comment  Coronary artery disease involving native coronary artery of native heart with angina pectoris (HCC)/H/O prolonged Q-T interval on ECG Referral to establish with cardiology as she has not been seen by cardiology in many years.  -     Ambulatory referral to Cardiology  Hospital discharge follow-up Reviewed hospital records.  -     CBC with Differential/Platelet -     CMP14+EGFR  Follow-up: Return in about 2 weeks (around 10/31/2020) for chronic follow up, repeat CXR. Sooner for new or worsening symptoms.   The patient indicates understanding of these issues and agrees with the plan.  Gwenlyn Perking, FNP

## 2020-10-18 LAB — CBC WITH DIFFERENTIAL/PLATELET
Basophils Absolute: 0 10*3/uL (ref 0.0–0.2)
Basos: 1 %
EOS (ABSOLUTE): 0.2 10*3/uL (ref 0.0–0.4)
Eos: 3 %
Hematocrit: 31.9 % — ABNORMAL LOW (ref 34.0–46.6)
Hemoglobin: 10.7 g/dL — ABNORMAL LOW (ref 11.1–15.9)
Immature Grans (Abs): 0 10*3/uL (ref 0.0–0.1)
Immature Granulocytes: 0 %
Lymphocytes Absolute: 2.7 10*3/uL (ref 0.7–3.1)
Lymphs: 40 %
MCH: 29.2 pg (ref 26.6–33.0)
MCHC: 33.5 g/dL (ref 31.5–35.7)
MCV: 87 fL (ref 79–97)
Monocytes Absolute: 0.4 10*3/uL (ref 0.1–0.9)
Monocytes: 6 %
Neutrophils Absolute: 3.4 10*3/uL (ref 1.4–7.0)
Neutrophils: 50 %
Platelets: 271 10*3/uL (ref 150–450)
RBC: 3.67 x10E6/uL — ABNORMAL LOW (ref 3.77–5.28)
RDW: 14.8 % (ref 11.7–15.4)
WBC: 6.7 10*3/uL (ref 3.4–10.8)

## 2020-10-18 LAB — CMP14+EGFR
ALT: 6 IU/L (ref 0–32)
AST: 16 IU/L (ref 0–40)
Albumin/Globulin Ratio: 1.2 (ref 1.2–2.2)
Albumin: 4 g/dL (ref 3.7–4.7)
Alkaline Phosphatase: 60 IU/L (ref 44–121)
BUN/Creatinine Ratio: 12 (ref 12–28)
BUN: 14 mg/dL (ref 8–27)
Bilirubin Total: 0.2 mg/dL (ref 0.0–1.2)
CO2: 27 mmol/L (ref 20–29)
Calcium: 9.3 mg/dL (ref 8.7–10.3)
Chloride: 98 mmol/L (ref 96–106)
Creatinine, Ser: 1.2 mg/dL — ABNORMAL HIGH (ref 0.57–1.00)
GFR calc Af Amer: 53 mL/min/{1.73_m2} — ABNORMAL LOW (ref >59)
GFR calc non Af Amer: 46 mL/min/{1.73_m2} — ABNORMAL LOW (ref >59)
Globulin, Total: 3.4 g/dL (ref 1.5–4.5)
Glucose: 197 mg/dL — ABNORMAL HIGH (ref 65–99)
Potassium: 4.5 mmol/L (ref 3.5–5.2)
Sodium: 140 mmol/L (ref 134–144)
Total Protein: 7.4 g/dL (ref 6.0–8.5)

## 2020-10-20 NOTE — Addendum Note (Signed)
Addended by: Gwenlyn Perking on: 10/20/2020 04:37 PM   Modules accepted: Orders

## 2020-10-24 NOTE — Addendum Note (Signed)
Addended by: Gwenlyn Perking on: 10/24/2020 08:02 AM   Modules accepted: Orders

## 2020-10-25 DIAGNOSIS — I509 Heart failure, unspecified: Secondary | ICD-10-CM | POA: Diagnosis not present

## 2020-10-25 DIAGNOSIS — K5792 Diverticulitis of intestine, part unspecified, without perforation or abscess without bleeding: Secondary | ICD-10-CM | POA: Diagnosis not present

## 2020-10-25 DIAGNOSIS — G35 Multiple sclerosis: Secondary | ICD-10-CM | POA: Diagnosis not present

## 2020-10-25 DIAGNOSIS — E785 Hyperlipidemia, unspecified: Secondary | ICD-10-CM | POA: Diagnosis not present

## 2020-10-25 DIAGNOSIS — I471 Supraventricular tachycardia: Secondary | ICD-10-CM | POA: Diagnosis not present

## 2020-10-25 DIAGNOSIS — I25119 Atherosclerotic heart disease of native coronary artery with unspecified angina pectoris: Secondary | ICD-10-CM | POA: Diagnosis not present

## 2020-10-25 DIAGNOSIS — F32A Depression, unspecified: Secondary | ICD-10-CM | POA: Diagnosis not present

## 2020-10-25 DIAGNOSIS — I110 Hypertensive heart disease with heart failure: Secondary | ICD-10-CM | POA: Diagnosis not present

## 2020-10-25 DIAGNOSIS — B3741 Candidal cystitis and urethritis: Secondary | ICD-10-CM | POA: Diagnosis not present

## 2020-10-25 DIAGNOSIS — J1282 Pneumonia due to coronavirus disease 2019: Secondary | ICD-10-CM | POA: Diagnosis not present

## 2020-10-25 DIAGNOSIS — I2583 Coronary atherosclerosis due to lipid rich plaque: Secondary | ICD-10-CM | POA: Diagnosis not present

## 2020-10-25 DIAGNOSIS — U071 COVID-19: Secondary | ICD-10-CM | POA: Diagnosis not present

## 2020-10-25 DIAGNOSIS — E1151 Type 2 diabetes mellitus with diabetic peripheral angiopathy without gangrene: Secondary | ICD-10-CM | POA: Diagnosis not present

## 2020-10-27 ENCOUNTER — Other Ambulatory Visit: Payer: Self-pay

## 2020-10-27 ENCOUNTER — Other Ambulatory Visit: Payer: Self-pay | Admitting: Family Medicine

## 2020-10-27 ENCOUNTER — Other Ambulatory Visit: Payer: PPO

## 2020-10-27 DIAGNOSIS — N3000 Acute cystitis without hematuria: Secondary | ICD-10-CM | POA: Diagnosis not present

## 2020-10-27 LAB — MICROSCOPIC EXAMINATION: RBC, Urine: NONE SEEN /hpf (ref 0–2)

## 2020-10-27 LAB — URINALYSIS, COMPLETE
Bilirubin, UA: NEGATIVE
Ketones, UA: NEGATIVE
Nitrite, UA: NEGATIVE
Protein,UA: NEGATIVE
Specific Gravity, UA: <1.005 — ABNORMAL LOW (ref 1.005–1.030)
Urobilinogen, Ur: 0.2 mg/dL (ref 0.2–1.0)
pH, UA: 5.5 (ref 5.0–7.5)

## 2020-10-28 ENCOUNTER — Other Ambulatory Visit: Payer: Self-pay | Admitting: Family Medicine

## 2020-10-28 DIAGNOSIS — N3000 Acute cystitis without hematuria: Secondary | ICD-10-CM

## 2020-10-28 MED ORDER — DOXYCYCLINE HYCLATE 100 MG PO TABS: 100.0000 mg | ORAL_TABLET | Freq: Two times a day (BID) | ORAL | 0 refills | Status: DC

## 2020-10-31 ENCOUNTER — Other Ambulatory Visit: Payer: Self-pay | Admitting: Family Medicine

## 2020-10-31 ENCOUNTER — Ambulatory Visit: Payer: PPO | Admitting: Family Medicine

## 2020-10-31 DIAGNOSIS — B379 Candidiasis, unspecified: Secondary | ICD-10-CM

## 2020-10-31 LAB — URINE CULTURE

## 2020-10-31 MED ORDER — SULFAMETHOXAZOLE-TRIMETHOPRIM 800-160 MG PO TABS: 1.0000 | ORAL_TABLET | Freq: Two times a day (BID) | ORAL | 0 refills | Status: AC

## 2020-10-31 NOTE — Progress Notes (Signed)
Reviewed latest EKG from 01/22, no prolonged QT noted.

## 2020-11-01 ENCOUNTER — Other Ambulatory Visit: Payer: Self-pay

## 2020-11-01 NOTE — Telephone Encounter (Signed)
[name] [place] [name] called regarding yeast medication for pt. Pt is there now and thought that a medication was going to be sent for it

## 2020-11-02 ENCOUNTER — Ambulatory Visit (INDEPENDENT_AMBULATORY_CARE_PROVIDER_SITE_OTHER): Payer: PPO | Admitting: Family Medicine

## 2020-11-02 ENCOUNTER — Ambulatory Visit (INDEPENDENT_AMBULATORY_CARE_PROVIDER_SITE_OTHER): Payer: PPO

## 2020-11-02 ENCOUNTER — Encounter: Payer: Self-pay | Admitting: Family Medicine

## 2020-11-02 ENCOUNTER — Other Ambulatory Visit: Payer: Self-pay | Admitting: Family Medicine

## 2020-11-02 ENCOUNTER — Other Ambulatory Visit: Payer: Self-pay

## 2020-11-02 VITALS — BP 139/70 | HR 94 | Temp 98.6°F | Ht 66.0 in | Wt 159.2 lb

## 2020-11-02 DIAGNOSIS — J1282 Pneumonia due to coronavirus disease 2019: Secondary | ICD-10-CM | POA: Diagnosis not present

## 2020-11-02 DIAGNOSIS — F411 Generalized anxiety disorder: Secondary | ICD-10-CM

## 2020-11-02 DIAGNOSIS — F32A Depression, unspecified: Secondary | ICD-10-CM | POA: Insufficient documentation

## 2020-11-02 DIAGNOSIS — B379 Candidiasis, unspecified: Secondary | ICD-10-CM

## 2020-11-02 DIAGNOSIS — F339 Major depressive disorder, recurrent, unspecified: Secondary | ICD-10-CM | POA: Insufficient documentation

## 2020-11-02 DIAGNOSIS — U071 COVID-19: Secondary | ICD-10-CM

## 2020-11-02 DIAGNOSIS — N3000 Acute cystitis without hematuria: Secondary | ICD-10-CM | POA: Diagnosis not present

## 2020-11-02 DIAGNOSIS — J189 Pneumonia, unspecified organism: Secondary | ICD-10-CM | POA: Diagnosis not present

## 2020-11-02 MED ORDER — FLUCONAZOLE 150 MG PO TABS: ORAL_TABLET | ORAL | 0 refills | Status: DC

## 2020-11-02 MED ORDER — FLUCONAZOLE 150 MG PO TABS: 150.0000 mg | ORAL_TABLET | Freq: Once | ORAL | 0 refills | Status: DC

## 2020-11-02 NOTE — Patient Instructions (Signed)
Urinary Tract Infection, Adult  A urinary tract infection (UTI) is an infection of any part of the urinary tract. The urinary tract includes the kidneys, ureters, bladder, and urethra. These organs make, store, and get rid of urine in the body. An upper UTI affects the ureters and kidneys. A lower UTI affects the bladder and urethra. What are the causes? Most urinary tract infections are caused by bacteria in your genital area around your urethra, where urine leaves your body. These bacteria grow and cause inflammation of your urinary tract. What increases the risk? You are more likely to develop this condition if:  You have a urinary catheter that stays in place.  You are not able to control when you urinate or have a bowel movement (incontinence).  You are female and you: ? Use a spermicide or diaphragm for birth control. ? Have low estrogen levels. ? Are pregnant.  You have certain genes that increase your risk.  You are sexually active.  You take antibiotic medicines.  You have a condition that causes your flow of urine to slow down, such as: ? An enlarged prostate, if you are female. ? Blockage in your urethra. ? A kidney stone. ? A nerve condition that affects your bladder control (neurogenic bladder). ? Not getting enough to drink, or not urinating often.  You have certain medical conditions, such as: ? Diabetes. ? A weak disease-fighting system (immunesystem). ? Sickle cell disease. ? Gout. ? Spinal cord injury. What are the signs or symptoms? Symptoms of this condition include:  Needing to urinate right away (urgency).  Frequent urination. This may include small amounts of urine each time you urinate.  Pain or burning with urination.  Blood in the urine.  Urine that smells bad or unusual.  Trouble urinating.  Cloudy urine.  Vaginal discharge, if you are female.  Pain in the abdomen or the lower back. You may also have:  Vomiting or a decreased  appetite.  Confusion.  Irritability or tiredness.  A fever or chills.  Diarrhea. The first symptom in older adults may be confusion. In some cases, they may not have any symptoms until the infection has worsened. How is this diagnosed? This condition is diagnosed based on your medical history and a physical exam. You may also have other tests, including:  Urine tests.  Blood tests.  Tests for STIs (sexually transmitted infections). If you have had more than one UTI, a cystoscopy or imaging studies may be done to determine the cause of the infections. How is this treated? Treatment for this condition includes:  Antibiotic medicine.  Over-the-counter medicines to treat discomfort.  Drinking enough water to stay hydrated. If you have frequent infections or have other conditions such as a kidney stone, you may need to see a health care provider who specializes in the urinary tract (urologist). In rare cases, urinary tract infections can cause sepsis. Sepsis is a life-threatening condition that occurs when the body responds to an infection. Sepsis is treated in the hospital with IV antibiotics, fluids, and other medicines. Follow these instructions at home: Medicines  Take over-the-counter and prescription medicines only as told by your health care provider.  If you were prescribed an antibiotic medicine, take it as told by your health care provider. Do not stop using the antibiotic even if you start to feel better. General instructions  Make sure you: ? Empty your bladder often and completely. Do not hold urine for long periods of time. ? Empty your bladder after   sex. ? Wipe from front to back after urinating or having a bowel movement if you are female. Use each tissue only one time when you wipe.  Drink enough fluid to keep your urine pale yellow.  Keep all follow-up visits. This is important.   Contact a health care provider if:  Your symptoms do not get better after 1-2  days.  Your symptoms go away and then return. Get help right away if:  You have severe pain in your back or your lower abdomen.  You have a fever or chills.  You have nausea or vomiting. Summary  A urinary tract infection (UTI) is an infection of any part of the urinary tract, which includes the kidneys, ureters, bladder, and urethra.  Most urinary tract infections are caused by bacteria in your genital area.  Treatment for this condition often includes antibiotic medicines.  If you were prescribed an antibiotic medicine, take it as told by your health care provider. Do not stop using the antibiotic even if you start to feel better.  Keep all follow-up visits. This is important. This information is not intended to replace advice given to you by your health care provider. Make sure you discuss any questions you have with your health care provider. Document Revised: 04/22/2020 Document Reviewed: 04/22/2020 Elsevier Patient Education  2021 [employer]  

## 2020-11-02 NOTE — Progress Notes (Signed)
Established Patient Office Visit  Subjective:  Patient ID: [name], female    DOB: 1951-05-26  Age: 70 y.o. MRN: [id number]  CC:  Chief Complaint  Patient presents with  . Shortness of Breath    HPI [name] presents for follow up of Covid 19 pneumonia.   1. Pneumonia She was diagnosed and treated from Covid pneumonia on 09/24/20. She had a repeat chest xray in the ED on 10/10/20 that showed improving pneumonia with recommended xray follow up in 2-3 weeks. She reports that she is feeling better. She only have shortness of breath now when walking from the parking lot into the building. She does not have shortness of breath walking around her house. She denies fever, wheezing, dizziness, chest pain, congestion, or sputum production.   2. UTI Aqua was diagnosed with a UTI on 10/10/20 in the ED. She was treated with omnicef. She continued to have symptoms and brought in a urine sample on 10/27/20. The culutre on 2/3 was also positive. She has started on bactrim for this UTI. She continues to report urinary frequency. She denies fever, flank pain, or vomiting. She feels like she has another yeast infection from all the antibiotics and would like a Rx for diflucan.   3. Weakness She has started PT for generalized weakness. She reports this has been going well.   4. Anxiety/depression She is taking Celexa daily. She reports this is working well, but maybe not quite enough. She feels like once she clears this UTI she will feel better mentally. She has been through a lot lately and feels like she will be fine once she gets over this hump.   GAD 7 : Generalized Anxiety Score 11/02/2020 03/02/2020  Nervous, Anxious, on Edge 3 3  Control/stop worrying 3 3  Worry too much - different things 3 0  Trouble relaxing 0 3  Restless 0 0  Easily annoyed or irritable 1 3  Afraid - awful might happen 0 1  Total GAD 7 Score 10 13  Anxiety Difficulty Not difficult at all Somewhat difficult     Depression screen [facility] 2/9 11/02/2020 11/02/2020 10/17/2020  Decreased Interest 3 0 0  Down, Depressed, Hopeless 3 0 0  PHQ - 2 Score 6 0 0  Altered sleeping 3 - -  Tired, decreased energy 3 - -  Change in appetite 0 - -  Feeling bad or failure about yourself  0 - -  Trouble concentrating 0 - -  Moving slowly or fidgety/restless 0 - -  Suicidal thoughts 0 - -  PHQ-9 Score 12 - -  Difficult doing work/chores Somewhat difficult - -  Some recent data might be hidden    Past Medical History:  Diagnosis Date  . Anxiety   . CAD (coronary artery disease)    DES to circumflex 02/2007, BMS to LAD and PTCA diagonal 03/2007  . Carotid artery plaque    Mild  . Cataract   . Depression   . Diverticulitis, colon   . Elevated d-dimer 01/08/2014  . Essential hypertension, benign   . GERD (gastroesophageal reflux disease)   . H/O hiatal hernia   . HLD (hyperlipidemia)   . IDDM (insulin dependent diabetes mellitus)   . Migraine    "used to have them really bad; don't have them anymore" (01/07/2014)  . MS (multiple sclerosis) ([place])    Not confirmed  . PAT (paroxysmal atrial tachycardia) ([place])   . Prolapse of uterus   . PVD (peripheral  vascular disease) ([place])   . TIA (transient ischemic attack) 1980's    Past Surgical History:  Procedure Laterality Date  . ABDOMINAL HYSTERECTOMY  1986   ovaries remain - prolaspe uterus   . APPENDECTOMY  ~ 1970  . BREAST BIOPSY Right 1980's  . BREAST LUMPECTOMY Right 1980's   Dr. Charlynne Pander   . CARDIAC CATHETERIZATION  01/07/2014  . CHOLECYSTECTOMY  ?1987  . COLONOSCOPY  2002   Dr. Anwar--> Severe diverticular changes in the region of the sigmoid and descending colon with scattered diverticular changes throughout the rest of the colon. No polyps, ulcerations. Despite numerous manipulations, the tip of the scope could not be tipped into the cecal area.  . COLONOSCOPY  01/10/2012   Procedure: COLONOSCOPY;  Surgeon: Daneil Dolin, MD;  [facility];   Service: Endoscopy;  Laterality: N/A;  1:55  . CORONARY ANGIOPLASTY WITH STENT PLACEMENT  ~ 1997 X 2   "2 + 1"  . EYE SURGERY Bilateral 2014   cataract  . INTRAVASCULAR PRESSURE WIRE/FFR STUDY N/A 03/08/2017   Procedure: Intravascular Pressure Wire/FFR Study;  Surgeon: Nelva Bush, MD;  Location: [place] [facility];  Service: Cardiovascular;  Laterality: N/A;  . LEFT HEART CATH AND CORONARY ANGIOGRAPHY N/A 03/08/2017   Procedure: Left Heart Cath and Coronary Angiography;  Surgeon: Nelva Bush, MD;  Location: [place] [facility];  Service: Cardiovascular;  Laterality: N/A;  . LEFT HEART CATHETERIZATION WITH CORONARY ANGIOGRAM N/A 01/07/2014   Procedure: LEFT HEART CATHETERIZATION WITH CORONARY ANGIOGRAM;  Surgeon: Larey Dresser, MD;  Location: [facility] CATH LAB;  Service: Cardiovascular;  Laterality: N/A;    Family History  Problem Relation Age of Onset  . Heart attack Mother 49  . Diabetes Mother   . Hypertension Mother   . Heart attack Father 14  . Heart attack Brother 32       x 6  . Heart disease Brother   . Diabetes Brother   . Colon cancer Paternal Aunt        17s, died with brain anuerysm  . Crohn's disease Cousin        paternal  . Diabetes Sister   . GER disease Daughter   . Cervical cancer Daughter   . Diabetes Daughter     Social History   Socioeconomic History  . Marital status: Widowed    Spouse name: Not on file  . Number of children: 4  . Years of education: 51  . Highest education level: 11th grade  Occupational History  . Occupation: Disability    Employer: DISABLED  Tobacco Use  . Smoking status: Never Smoker  . Smokeless tobacco: Never Used  . Tobacco comment: spouse, 74 years - husband has quit 01/2011  Vaping Use  . Vaping Use: Never used  Substance and Sexual Activity  . Alcohol use: No  . Drug use: No  . Sexual activity: Not Currently  Other Topics Concern  . Not on file  Social History Narrative  . Not on file   Social Determinants  of Health   Financial Resource Strain: Not on file  Food Insecurity: Not on file  Transportation Needs: Not on file  Physical Activity: Not on file  Stress: Not on file  Social Connections: Not on file  Intimate Partner Violence: Not on file    Outpatient Medications Prior to Visit  Medication Sig Dispense Refill  . amLODipine (NORVASC) 10 MG tablet Take 1 tablet (10 mg total) by mouth daily. 30 tablet  5  . aspirin 325 MG tablet Take 325 mg by mouth daily.    Marland Kitchen atenolol (TENORMIN) 50 MG tablet Take 1 tablet (50 mg total) by mouth 2 (two) times daily. 180 tablet 3  . calcium carbonate (OS-CAL) 600 MG TABS tablet Take 1 tablet (600 mg total) by mouth 2 (two) times daily with a meal. 180 tablet 3  . citalopram (CELEXA) 40 MG tablet TAKE ONE (1) TABLET EACH DAY 90 tablet 0  . clonazePAM (KLONOPIN) 0.5 MG tablet Take 1 tablet (0.5 mg total) by mouth at bedtime as needed for anxiety. 30 tablet 2  . empagliflozin (JARDIANCE) 10 MG TABS tablet Take 10 mg by mouth daily. 30 tablet 3  . esomeprazole (NEXIUM) 40 MG capsule Take 1 capsule (40 mg total) by mouth daily. 30 capsule 3  . fluconazole (DIFLUCAN) 150 MG tablet Take 1 tablet now. May take another tablet after finishing antibiotic. 2 tablet 0  . fluconazole (DIFLUCAN) 150 MG tablet Take 1 tablet (150 mg total) by mouth once for 1 dose. 1 tablet 0  . FLUZONE HIGH-DOSE QUADRIVALENT 0.7 ML SUSY     . furosemide (LASIX) 20 MG tablet TAKE ONE (1) TABLET EACH DAY 30 tablet 0  . isosorbide mononitrate (IMDUR) 120 MG 24 hr tablet TAKE ONE (1) TABLET EACH DAY 90 tablet 1  . lisinopril (ZESTRIL) 40 MG tablet Take 1 tablet (40 mg total) by mouth daily. 90 tablet 3  . meclizine (ANTIVERT) 25 MG tablet Take 1 tablet (25 mg total) by mouth 3 (three) times daily as needed for dizziness. 90 tablet 3  . nitroGLYCERIN (NITROSTAT) 0.4 MG SL tablet DISSOLVE 1 TABLET UNDER TONGUE FOR CHESTPAIN.MAY REPEAT EVERY 5 MINUTES FOR 3 DOSES.IF NO RELIEF CALL 911 OR GO TO  ER 25 tablet 6  . NOVOLOG FLEXPEN 100 UNIT/ML FlexPen INJECT 35-40 UNITS SQ 3 TIMES DAILY WITH MEALS (Patient taking differently: 8-30 Units. Takes 8-30 units TID with meals depending on glucose) 30 mL 3  . ondansetron (ZOFRAN) 8 MG tablet Take 0.5 tablets (4 mg total) by mouth every 6 (six) hours as needed. 60 tablet 0  . potassium chloride SA (KLOR-CON) 20 MEQ tablet TAKE TWO TABLETS BY MOUTH DAILY 180 tablet 3  . rOPINIRole (REQUIP) 0.5 MG tablet Take 1 tablet (0.5 mg total) by mouth at bedtime. 90 tablet 3  . rosuvastatin (CRESTOR) 40 MG tablet TAKE ONE (1) TABLET EACH DAY 90 tablet 3  . Semaglutide,0.25 or 0.5MG /DOS, (OZEMPIC, 0.25 OR 0.5 MG/DOSE,) 2 MG/1.5ML SOPN Inject 0.375 mLs (0.5 mg total) into the skin once a week. 1.5 mL 11  . spironolactone (ALDACTONE) 25 MG tablet TAKE ONE (1) TABLET EACH DAY 30 tablet 0  . sulfamethoxazole-trimethoprim (BACTRIM DS) 800-160 MG tablet Take 1 tablet by mouth 2 (two) times daily for 10 days. 20 tablet 0  . TOUJEO SOLOSTAR 300 UNIT/ML SOPN INJECT 50-60 UNITS SQ DAILY (Patient taking differently: 30 Units daily.) 9 mL 2  . vitamin B-12 (CYANOCOBALAMIN) 1000 MCG tablet Take 1,000 mcg by mouth daily.    . Vitamin D, Ergocalciferol, (DRISDOL) 1.25 MG (50000 UNIT) CAPS capsule TAKE 1 CAPSULE EVERY 7 DAYS 12 capsule 3   Facility-Administered Medications Prior to Visit  Medication Dose Route Frequency Provider Last Rate Last Admin  . methylPREDNISolone acetate (DEPO-MEDROL) injection 80 mg  80 mg Intramuscular Once Dettinger, Fransisca Kaufmann, MD        Allergies  Allergen Reactions  . Iohexol      Desc: pt had  syncopal episode with nausea post IV CM late 1990's,  pt has had prednisone prep with heart caths x 2 without problem  kdean 04/16/07, Onset Date: [id number]   . Ticlid [Ticlopidine Hcl] Nausea And Vomiting  . Codeine Nausea And Vomiting and Palpitations    ROS Review of Systems As per HPI.    Objective:    Physical Exam Vitals and nursing note  reviewed.  Constitutional:      General: She is not in acute distress.    Appearance: She is not ill-appearing, toxic-appearing or diaphoretic.  HENT:     Head: Normocephalic and atraumatic.  Cardiovascular:     Rate and Rhythm: Normal rate and regular rhythm.  Pulmonary:     Effort: Pulmonary effort is normal. No tachypnea.     Breath sounds: Normal breath sounds. No wheezing, rhonchi or rales.  Chest:     Chest wall: No tenderness.  Abdominal:     General: Bowel sounds are normal.     Palpations: Abdomen is soft. There is no mass.     Tenderness: There is no abdominal tenderness. There is no guarding or rebound.  Musculoskeletal:     Cervical back: Normal range of motion and neck supple.     Right lower leg: No edema.     Left lower leg: No edema.  Skin:    General: Skin is warm and dry.  Neurological:     General: No focal deficit present.     Mental Status: She is alert and oriented to person, place, and time.  Psychiatric:        Mood and Affect: Mood normal.        Behavior: Behavior normal.     BP 139/70   Pulse 94   Temp 98.6 F (37 C)   Ht 5\' 6"  (1.676 m)   Wt 159 lb 3.2 oz (72.2 kg)   SpO2 98%   BMI 25.70 kg/m  Wt Readings from Last 3 Encounters:  11/02/20 159 lb 3.2 oz (72.2 kg)  10/17/20 158 lb 3.2 oz (71.8 kg)  10/11/20 160 lb (72.6 kg)     Health Maintenance Due  Topic Date Due  . FOOT EXAM  09/27/2020  . OPHTHALMOLOGY EXAM  09/27/2020    There are no preventive care reminders to display for this patient.  Lab Results  Component Value Date   TSH 1.950 04/23/2018   Lab Results  Component Value Date   WBC 6.7 10/17/2020   HGB 10.7 (L) 10/17/2020   HCT 31.9 (L) 10/17/2020   MCV 87 10/17/2020   PLT 271 10/17/2020   Lab Results  Component Value Date   NA 140 10/17/2020   K 4.5 10/17/2020   CO2 27 10/17/2020   GLUCOSE 197 (H) 10/17/2020   BUN 14 10/17/2020   CREATININE 1.20 (H) 10/17/2020   BILITOT 0.2 10/17/2020   ALKPHOS 60  10/17/2020   AST 16 10/17/2020   ALT 6 10/17/2020   PROT 7.4 10/17/2020   ALBUMIN 4.0 10/17/2020   CALCIUM 9.3 10/17/2020   ANIONGAP 9 10/12/2020   Lab Results  Component Value Date   CHOL 128 03/02/2020   Lab Results  Component Value Date   HDL 34 (L) 03/02/2020   Lab Results  Component Value Date   LDLCALC 68 03/02/2020   Lab Results  Component Value Date   TRIG 146 03/02/2020   Lab Results  Component Value Date   CHOLHDL 3.8 03/02/2020   Lab Results  Component Value Date  HGBA1C 8.0 (H) 10/10/2020      Assessment & Plan:   Mea was seen today for shortness of breath.  Diagnoses and all orders for this visit:  Pneumonia due to COVID-19 virus Improving clinically. Xray today in office, radiology report pending. Will notify patient of results.  -     DG Chest 2 View; Future  Yeast infection Diflucan ordered for yeast infection related to antibiotic use.  -     fluconazole (DIFLUCAN) 150 MG tablet; Take 1 tablet now. May take another tablet after finishing antibiotic.  Acute cystitis without hematuria Currently on Bactrim. Discussed possible need for urology referral for recurrent UTIs. If needed, she would prefer [facility] urology. Will repeat UA and culture in 2 weeks.  -     Urine Culture; Future -     Urinalysis, Complete; Future  Recurrent Depression/Generalized anxiety disorder Uncontrolled. On Celexa 40 mg daily. She feels that this is temporary due to recent illness. Will readdress in 2 weeks with chronic follow up.   Follow-up: Return in about 2 weeks (around 11/16/2020) for chronic follow up.   The patient indicates understanding of these issues and agrees with the plan.   Gwenlyn Perking, FNP

## 2020-11-03 ENCOUNTER — Other Ambulatory Visit: Payer: Self-pay | Admitting: Family Medicine

## 2020-11-03 ENCOUNTER — Telehealth: Payer: Self-pay

## 2020-11-03 DIAGNOSIS — F411 Generalized anxiety disorder: Secondary | ICD-10-CM

## 2020-11-03 NOTE — Telephone Encounter (Signed)
Patient informed that refill request was sent to [facility] this morning and we are waiting on approval or denial.

## 2020-11-03 NOTE — Telephone Encounter (Signed)
  Prescription Request  11/03/2020  What is the name of the medication or equipment? Clonazepam / pt was seen yesterday with tiffany morgan.  Have you contacted your pharmacy to request a refill? (if applicable) yes  Which [facility]   Patient notified that their request is being sent to the clinical staff for review and that they should receive a response within 2 business days.

## 2020-11-08 ENCOUNTER — Ambulatory Visit (INDEPENDENT_AMBULATORY_CARE_PROVIDER_SITE_OTHER): Payer: PPO

## 2020-11-08 ENCOUNTER — Other Ambulatory Visit: Payer: Self-pay

## 2020-11-08 DIAGNOSIS — Z8616 Personal history of COVID-19: Secondary | ICD-10-CM

## 2020-11-08 DIAGNOSIS — Z9181 History of falling: Secondary | ICD-10-CM

## 2020-11-08 DIAGNOSIS — K5792 Diverticulitis of intestine, part unspecified, without perforation or abscess without bleeding: Secondary | ICD-10-CM

## 2020-11-08 DIAGNOSIS — B3741 Candidal cystitis and urethritis: Secondary | ICD-10-CM | POA: Diagnosis not present

## 2020-11-08 DIAGNOSIS — I471 Supraventricular tachycardia: Secondary | ICD-10-CM | POA: Diagnosis not present

## 2020-11-08 DIAGNOSIS — I25119 Atherosclerotic heart disease of native coronary artery with unspecified angina pectoris: Secondary | ICD-10-CM | POA: Diagnosis not present

## 2020-11-08 DIAGNOSIS — I110 Hypertensive heart disease with heart failure: Secondary | ICD-10-CM

## 2020-11-08 DIAGNOSIS — J1282 Pneumonia due to coronavirus disease 2019: Secondary | ICD-10-CM | POA: Diagnosis not present

## 2020-11-08 DIAGNOSIS — Z794 Long term (current) use of insulin: Secondary | ICD-10-CM

## 2020-11-08 DIAGNOSIS — I2583 Coronary atherosclerosis due to lipid rich plaque: Secondary | ICD-10-CM

## 2020-11-08 DIAGNOSIS — K219 Gastro-esophageal reflux disease without esophagitis: Secondary | ICD-10-CM

## 2020-11-08 DIAGNOSIS — I509 Heart failure, unspecified: Secondary | ICD-10-CM | POA: Diagnosis not present

## 2020-11-08 DIAGNOSIS — Z7984 Long term (current) use of oral hypoglycemic drugs: Secondary | ICD-10-CM

## 2020-11-08 DIAGNOSIS — R296 Repeated falls: Secondary | ICD-10-CM

## 2020-11-08 DIAGNOSIS — E1151 Type 2 diabetes mellitus with diabetic peripheral angiopathy without gangrene: Secondary | ICD-10-CM

## 2020-11-08 DIAGNOSIS — Z7982 Long term (current) use of aspirin: Secondary | ICD-10-CM

## 2020-11-08 DIAGNOSIS — R9431 Abnormal electrocardiogram [ECG] [EKG]: Secondary | ICD-10-CM

## 2020-11-08 DIAGNOSIS — G35 Multiple sclerosis: Secondary | ICD-10-CM

## 2020-11-08 DIAGNOSIS — F32A Depression, unspecified: Secondary | ICD-10-CM

## 2020-11-08 DIAGNOSIS — Z8673 Personal history of transient ischemic attack (TIA), and cerebral infarction without residual deficits: Secondary | ICD-10-CM

## 2020-11-08 DIAGNOSIS — E785 Hyperlipidemia, unspecified: Secondary | ICD-10-CM

## 2020-11-08 DIAGNOSIS — Z79899 Other long term (current) drug therapy: Secondary | ICD-10-CM

## 2020-11-15 ENCOUNTER — Observation Stay (HOSPITAL_COMMUNITY)
Admission: EM | Admit: 2020-11-15 | Discharge: 2020-11-16 | Disposition: A | Discharge status: Home / Self Care | Payer: PPO | Attending: Internal Medicine | Admitting: Internal Medicine

## 2020-11-15 ENCOUNTER — Emergency Department (HOSPITAL_COMMUNITY): Payer: PPO

## 2020-11-15 ENCOUNTER — Other Ambulatory Visit: Payer: Self-pay

## 2020-11-15 ENCOUNTER — Encounter (HOSPITAL_COMMUNITY): Payer: Self-pay | Admitting: Emergency Medicine

## 2020-11-15 DIAGNOSIS — E875 Hyperkalemia: Secondary | ICD-10-CM | POA: Diagnosis not present

## 2020-11-15 DIAGNOSIS — G319 Degenerative disease of nervous system, unspecified: Secondary | ICD-10-CM | POA: Diagnosis not present

## 2020-11-15 DIAGNOSIS — E119 Type 2 diabetes mellitus without complications: Secondary | ICD-10-CM | POA: Diagnosis not present

## 2020-11-15 DIAGNOSIS — I25119 Atherosclerotic heart disease of native coronary artery with unspecified angina pectoris: Secondary | ICD-10-CM | POA: Insufficient documentation

## 2020-11-15 DIAGNOSIS — Z79899 Other long term (current) drug therapy: Secondary | ICD-10-CM | POA: Diagnosis not present

## 2020-11-15 DIAGNOSIS — I700 Atherosclerosis of aorta: Secondary | ICD-10-CM | POA: Diagnosis not present

## 2020-11-15 DIAGNOSIS — R531 Weakness: Secondary | ICD-10-CM

## 2020-11-15 DIAGNOSIS — I6782 Cerebral ischemia: Secondary | ICD-10-CM | POA: Diagnosis not present

## 2020-11-15 DIAGNOSIS — R55 Syncope and collapse: Secondary | ICD-10-CM | POA: Diagnosis not present

## 2020-11-15 DIAGNOSIS — Z7984 Long term (current) use of oral hypoglycemic drugs: Secondary | ICD-10-CM | POA: Diagnosis not present

## 2020-11-15 DIAGNOSIS — R42 Dizziness and giddiness: Secondary | ICD-10-CM | POA: Diagnosis present

## 2020-11-15 DIAGNOSIS — E860 Dehydration: Secondary | ICD-10-CM | POA: Diagnosis not present

## 2020-11-15 DIAGNOSIS — E1159 Type 2 diabetes mellitus with other circulatory complications: Secondary | ICD-10-CM | POA: Diagnosis present

## 2020-11-15 DIAGNOSIS — I10 Essential (primary) hypertension: Secondary | ICD-10-CM | POA: Diagnosis present

## 2020-11-15 DIAGNOSIS — Z7982 Long term (current) use of aspirin: Secondary | ICD-10-CM | POA: Diagnosis not present

## 2020-11-15 DIAGNOSIS — Z8673 Personal history of transient ischemic attack (TIA), and cerebral infarction without residual deficits: Secondary | ICD-10-CM | POA: Diagnosis not present

## 2020-11-15 DIAGNOSIS — J42 Unspecified chronic bronchitis: Secondary | ICD-10-CM | POA: Diagnosis not present

## 2020-11-15 DIAGNOSIS — E1165 Type 2 diabetes mellitus with hyperglycemia: Secondary | ICD-10-CM | POA: Diagnosis present

## 2020-11-15 LAB — COMPREHENSIVE METABOLIC PANEL
ALT: 15 U/L (ref 0–44)
AST: 19 U/L (ref 15–41)
Albumin: 4 g/dL (ref 3.5–5.0)
Alkaline Phosphatase: 47 U/L (ref 38–126)
Anion gap: 9 (ref 5–15)
BUN: 27 mg/dL — ABNORMAL HIGH (ref 8–23)
CO2: 26 mmol/L (ref 22–32)
Calcium: 9.9 mg/dL (ref 8.9–10.3)
Chloride: 98 mmol/L (ref 98–111)
Creatinine, Ser: 1.78 mg/dL — ABNORMAL HIGH (ref 0.44–1.00)
GFR, Estimated: 30 mL/min — ABNORMAL LOW (ref >60)
Glucose, Bld: 202 mg/dL — ABNORMAL HIGH (ref 70–99)
Potassium: 5.4 mmol/L — ABNORMAL HIGH (ref 3.5–5.1)
Sodium: 133 mmol/L — ABNORMAL LOW (ref 135–145)
Total Bilirubin: 0.8 mg/dL (ref 0.3–1.2)
Total Protein: 7.6 g/dL (ref 6.5–8.1)

## 2020-11-15 LAB — CBC WITH DIFFERENTIAL/PLATELET
Abs Immature Granulocytes: 0.02 10*3/uL (ref 0.00–0.07)
Basophils Absolute: 0 10*3/uL (ref 0.0–0.1)
Basophils Relative: 0 %
Eosinophils Absolute: 0.1 10*3/uL (ref 0.0–0.5)
Eosinophils Relative: 1 %
HCT: 33.6 % — ABNORMAL LOW (ref 36.0–46.0)
Hemoglobin: 10.9 g/dL — ABNORMAL LOW (ref 12.0–15.0)
Immature Granulocytes: 0 %
Lymphocytes Relative: 33 %
Lymphs Abs: 2.8 10*3/uL (ref 0.7–4.0)
MCH: 29 pg (ref 26.0–34.0)
MCHC: 32.4 g/dL (ref 30.0–36.0)
MCV: 89.4 fL (ref 80.0–100.0)
Monocytes Absolute: 0.4 10*3/uL (ref 0.1–1.0)
Monocytes Relative: 5 %
Neutro Abs: 5.1 10*3/uL (ref 1.7–7.7)
Neutrophils Relative %: 61 %
Platelets: 238 10*3/uL (ref 150–400)
RBC: 3.76 MIL/uL — ABNORMAL LOW (ref 3.87–5.11)
RDW: 14.3 % (ref 11.5–15.5)
WBC: 8.4 10*3/uL (ref 4.0–10.5)
nRBC: 0 % (ref 0.0–0.2)

## 2020-11-15 LAB — APTT: aPTT: 29 seconds (ref 24–36)

## 2020-11-15 LAB — TROPONIN I (HIGH SENSITIVITY)
Troponin I (High Sensitivity): 4 ng/L (ref <18)
Troponin I (High Sensitivity): 4 ng/L (ref <18)

## 2020-11-15 LAB — LACTIC ACID, PLASMA
Lactic Acid, Venous: 1.4 mmol/L (ref 0.5–1.9)
Lactic Acid, Venous: 1.5 mmol/L (ref 0.5–1.9)

## 2020-11-15 LAB — PROTIME-INR
INR: 1.1 (ref 0.8–1.2)
Prothrombin Time: 13.5 seconds (ref 11.4–15.2)

## 2020-11-15 MED ORDER — SODIUM CHLORIDE 0.9 % IV BOLUS (SEPSIS)
1000.0000 mL | Freq: Once | INTRAVENOUS | Status: AC
  Administered 2020-11-15: 1000 mL via INTRAVENOUS

## 2020-11-15 MED ORDER — ACETAMINOPHEN 325 MG PO TABS
650.0000 mg | ORAL_TABLET | Freq: Once | ORAL | Status: AC
  Administered 2020-11-15: 650 mg via ORAL
  Filled 2020-11-15: qty 2

## 2020-11-15 MED ORDER — SODIUM CHLORIDE 0.9 % IV BOLUS
500.0000 mL | Freq: Once | INTRAVENOUS | Status: AC
  Administered 2020-11-15: 500 mL via INTRAVENOUS

## 2020-11-15 NOTE — ED Triage Notes (Signed)
Pt states around 1300 today pt turned paled and got dizzy having a syncopal episode that was witnessed. Pt states she fell about one week ago striking head. Pt states she feels weak all over.

## 2020-11-15 NOTE — ED Notes (Signed)
Entered room and introduced self to patient. Pt appears to be resting in bed, respirations are even and unlabored with equal chest rise and fall. Bed is locked in the lowest position, side rails x2, call bell within reach. Pt educated on call light use and hourly rounding, verbalized understanding and in agreement at this time. All questions and concerns voiced addressed. Refreshments offered and provided per patient request. Cardiac monitor in place with vital signs cycling q30 minutes. Will continue to monitor.   Lab at bedside.

## 2020-11-15 NOTE — ED Provider Notes (Signed)
[facility] EMERGENCY DEPARTMENT Provider Note   CSN: [id number] Arrival date & time: 11/15/20  1920     History Chief Complaint  Patient presents with  . Loss of Consciousness    [name] is a 70 y.o. female.  Patient states that she has been dizzy when she gets up today and walked around and she almost passed out once.  Patient states that she did fall a number days ago and hit her head but no loss consciousness  The history is provided by the patient. No language interpreter was used.  Loss of Consciousness Episode history:  Single Most recent episode:  Today Timing:  Sporadic Progression:  Waxing and waning Chronicity:  New Context: not blood draw   Witnessed: no   Relieved by:  Nothing Worsened by:  Nothing Ineffective treatments:  None tried Associated symptoms: dizziness   Associated symptoms: no chest pain, no headaches and no seizures        Past Medical History:  Diagnosis Date  . Anxiety   . CAD (coronary artery disease)    DES to circumflex 02/2007, BMS to LAD and PTCA diagonal 03/2007  . Carotid artery plaque    Mild  . Cataract   . Depression   . Diverticulitis, colon   . Elevated d-dimer 01/08/2014  . Essential hypertension, benign   . GERD (gastroesophageal reflux disease)   . H/O hiatal hernia   . HLD (hyperlipidemia)   . IDDM (insulin dependent diabetes mellitus)   . Migraine    "used to have them really bad; don't have them anymore" (01/07/2014)  . MS (multiple sclerosis) ([place])    Not confirmed  . PAT (paroxysmal atrial tachycardia) ([place])   . Prolapse of uterus   . PVD (peripheral vascular disease) ([place])   . TIA (transient ischemic attack) 1980's    Patient Active Problem List   Diagnosis Date Noted  . Depression, recurrent ([place]) 11/02/2020  . UTI (urinary tract infection) 10/11/2020  . Generalized weakness 10/11/2020  . Recurrent falls 10/11/2020  . Prolonged QT interval 10/11/2020  . Osteopenia after menopause 12/13/2017  .  H/O prolonged Q-T interval on ECG 03/06/2017  . B12 deficiency 01/13/2016  . History of stroke 09/01/2015  . History of TIA (transient ischemic attack) 08/14/2014  . DM type 2 with diabetic dyslipidemia ([place]) 08/14/2014  . Cataract 02/08/2014  . Macular degeneration 02/08/2014  . Accelerating angina ([place]) 01/07/2014  . Generalized anxiety disorder 09/14/2013  . Claudication ([place]) 06/18/2013  . GERD (gastroesophageal reflux disease) 01/01/2012  . Hypokalemia 08/24/2011  . Hyperglycemia due to diabetes mellitus ([place]) 08/23/2011  . Essential hypertension 08/23/2011  . PALPITATIONS 02/10/2010  . Hyperlipidemia 05/10/2009  . Depression with anxiety 05/10/2009  . Coronary artery disease involving native coronary artery of native heart with angina pectoris ([place]) 08/26/2008    Past Surgical History:  Procedure Laterality Date  . ABDOMINAL HYSTERECTOMY  1986   ovaries remain - prolaspe uterus   . APPENDECTOMY  ~ 1970  . BREAST BIOPSY Right 1980's  . BREAST LUMPECTOMY Right 1980's   Dr. Charlynne Pander   . CARDIAC CATHETERIZATION  01/07/2014  . CHOLECYSTECTOMY  ?1987  . COLONOSCOPY  2002   Dr. Anwar--> Severe diverticular changes in the region of the sigmoid and descending colon with scattered diverticular changes throughout the rest of the colon. No polyps, ulcerations. Despite numerous manipulations, the tip of the scope could not be tipped into the cecal area.  . COLONOSCOPY  01/10/2012   Procedure: COLONOSCOPY;  Surgeon: Daneil Dolin, MD;  Location: [facility];  Service: Endoscopy;  Laterality: N/A;  1:55  . CORONARY ANGIOPLASTY WITH STENT PLACEMENT  ~ 1997 X 2   "2 + 1"  . EYE SURGERY Bilateral 2014   cataract  . INTRAVASCULAR PRESSURE WIRE/FFR STUDY N/A 03/08/2017   Procedure: Intravascular Pressure Wire/FFR Study;  Surgeon: Nelva Bush, MD;  Location: [place] CV LAB;  Service: Cardiovascular;  Laterality: N/A;  . LEFT HEART CATH AND CORONARY ANGIOGRAPHY N/A 03/08/2017    Procedure: Left Heart Cath and Coronary Angiography;  Surgeon: Nelva Bush, MD;  Location: [place] CV LAB;  Service: Cardiovascular;  Laterality: N/A;  . LEFT HEART CATHETERIZATION WITH CORONARY ANGIOGRAM N/A 01/07/2014   Procedure: LEFT HEART CATHETERIZATION WITH CORONARY ANGIOGRAM;  Surgeon: Larey Dresser, MD;  Location: [facility] CATH LAB;  Service: Cardiovascular;  Laterality: N/A;     OB History   No obstetric history on file.     Family History  Problem Relation Age of Onset  . Heart attack Mother 89  . Diabetes Mother   . Hypertension Mother   . Heart attack Father 53  . Heart attack Brother 32       x 6  . Heart disease Brother   . Diabetes Brother   . Colon cancer Paternal Aunt        40s, died with brain anuerysm  . Crohn's disease Cousin        paternal  . Diabetes Sister   . GER disease Daughter   . Cervical cancer Daughter   . Diabetes Daughter     Social History   Tobacco Use  . Smoking status: Never Smoker  . Smokeless tobacco: Never Used  . Tobacco comment: spouse, 109 years - husband has quit 01/2011  Vaping Use  . Vaping Use: Never used  Substance Use Topics  . Alcohol use: No  . Drug use: No    Home Medications Prior to Admission medications   Medication Sig Start Date End Date Taking? Authorizing Provider  amLODipine (NORVASC) 10 MG tablet Take 1 tablet (10 mg total) by mouth daily. 10/12/20  Yes Emokpae, Courage, MD  aspirin 325 MG tablet Take 325 mg by mouth daily.   Yes [provider]  atenolol (TENORMIN) 50 MG tablet Take 1 tablet (50 mg total) by mouth 2 (two) times daily. 01/13/19  Yes Chipper Herb, MD  calcium carbonate (OS-CAL) 600 MG TABS tablet Take 1 tablet (600 mg total) by mouth 2 (two) times daily with a meal. 07/19/20  Yes Gwenlyn Perking, FNP  citalopram (CELEXA) 40 MG tablet TAKE ONE (1) TABLET EACH DAY Patient taking differently: Take 40 mg by mouth daily. 11/03/20  Yes Gwenlyn Perking, FNP  empagliflozin  (JARDIANCE) 10 MG TABS tablet Take 10 mg by mouth daily. 02/20/19  Yes Chipper Herb, MD  esomeprazole (NEXIUM) 40 MG capsule Take 1 capsule (40 mg total) by mouth daily. 03/17/20  Yes Hendricks Limes F, FNP  fluconazole (DIFLUCAN) 150 MG tablet Take 1 tablet now. May take another tablet after finishing antibiotic. 11/02/20  Yes Gwenlyn Perking, FNP  isosorbide mononitrate (IMDUR) 120 MG 24 hr tablet TAKE ONE (1) TABLET EACH DAY Patient taking differently: Take 120 mg by mouth daily. TAKE ONE (1) TABLET EACH DAY 03/22/20  Yes Dettinger, Fransisca Kaufmann, MD  lisinopril (ZESTRIL) 40 MG tablet Take 1 tablet (40 mg total) by mouth daily. 01/13/19  Yes Chipper Herb, MD  meclizine Johnathan Hausen)  25 MG tablet Take 1 tablet (25 mg total) by mouth 3 (three) times daily as needed for dizziness. 11/26/19  Yes Dettinger, Fransisca Kaufmann, MD  nitroGLYCERIN (NITROSTAT) 0.4 MG SL tablet DISSOLVE 1 TABLET UNDER TONGUE FOR CHESTPAIN.MAY REPEAT EVERY 5 MINUTES FOR 3 DOSES.IF NO RELIEF CALL 911 OR GO TO ER 06/08/20  Yes Lilia Pro, Tiffany M, FNP  NOVOLOG FLEXPEN 100 UNIT/ML FlexPen INJECT 35-40 UNITS SQ 3 TIMES DAILY WITH MEALS Patient taking differently: 8-30 Units. Takes 8-30 units TID with meals depending on glucose 09/15/19  Yes Dettinger, Fransisca Kaufmann, MD  potassium chloride SA (KLOR-CON) 20 MEQ tablet TAKE TWO TABLETS BY MOUTH DAILY 09/20/20  Yes Gwenlyn Perking, FNP  rOPINIRole (REQUIP) 0.5 MG tablet Take 1 tablet (0.5 mg total) by mouth at bedtime. 09/30/19  Yes Dettinger, Fransisca Kaufmann, MD  rosuvastatin (CRESTOR) 40 MG tablet TAKE ONE (1) TABLET EACH DAY Patient taking differently: Take 40 mg by mouth daily. 04/14/20  Yes Dettinger, Fransisca Kaufmann, MD  Semaglutide,0.25 or 0.5MG /DOS, (OZEMPIC, 0.25 OR 0.5 MG/DOSE,) 2 MG/1.5ML SOPN Inject 0.375 mLs (0.5 mg total) into the skin once a week. 04/20/20  Yes Dettinger, Fransisca Kaufmann, MD  spironolactone (ALDACTONE) 25 MG tablet TAKE ONE (1) TABLET EACH DAY Patient taking differently: Take 25 mg by mouth daily.  09/27/20  Yes Morgan, Tiffany M, FNP  TOUJEO SOLOSTAR 300 UNIT/ML SOPN INJECT 50-60 UNITS SQ DAILY Patient taking differently: Inject 50 Units into the skin daily. 09/15/19  Yes Dettinger, Fransisca Kaufmann, MD  vitamin B-12 (CYANOCOBALAMIN) 1000 MCG tablet Take 1,000 mcg by mouth daily.   Yes [provider]  Vitamin D, Ergocalciferol, (DRISDOL) 1.25 MG (50000 UNIT) CAPS capsule TAKE 1 CAPSULE EVERY 7 DAYS 07/19/20  Yes Gwenlyn Perking, FNP  clonazePAM (KLONOPIN) 0.5 MG tablet Take 1 tablet (0.5 mg total) by mouth at bedtime as needed for anxiety. Patient not taking: Reported on 11/15/2020 06/17/19   Dettinger, Fransisca Kaufmann, MD  FLUZONE HIGH-DOSE QUADRIVALENT 0.7 ML SUSY  06/25/20   [provider]  furosemide (LASIX) 20 MG tablet TAKE ONE (1) TABLET EACH DAY Patient taking differently: Take 20 mg by mouth daily. 09/27/20   Gwenlyn Perking, FNP  ondansetron (ZOFRAN) 8 MG tablet Take 0.5 tablets (4 mg total) by mouth every 6 (six) hours as needed. Patient not taking: Reported on 11/15/2020 09/28/20   Gwenlyn Perking, FNP    Allergies    Iohexol, Ticlid [ticlopidine hcl], and Codeine  Review of Systems   Review of Systems  Constitutional: Negative for appetite change and fatigue.  HENT: Negative for congestion, ear discharge and sinus pressure.   Eyes: Negative for discharge.  Respiratory: Negative for cough.   Cardiovascular: Positive for syncope. Negative for chest pain.  Gastrointestinal: Negative for abdominal pain and diarrhea.  Genitourinary: Negative for frequency and hematuria.  Musculoskeletal: Negative for back pain.  Skin: Negative for rash.  Neurological: Positive for dizziness. Negative for seizures and headaches.  Psychiatric/Behavioral: Negative for hallucinations.    Physical Exam Updated Vital Signs BP (!) 88/53   Pulse 76   Temp 98.3 F (36.8 C)   Resp 14   Ht 5\' 6"  (1.676 m)   Wt 70.8 kg   SpO2 95%   BMI 25.18 kg/m   Physical Exam Vitals and nursing  note reviewed.  Constitutional:      Appearance: She is well-developed.  HENT:     Head: Normocephalic.     Nose: Nose normal.  Eyes:  General: No scleral icterus.    Extraocular Movements: EOM normal.     Conjunctiva/sclera: Conjunctivae normal.  Neck:     Thyroid: No thyromegaly.  Cardiovascular:     Rate and Rhythm: Normal rate and regular rhythm.     Heart sounds: No murmur heard. No friction rub. No gallop.   Pulmonary:     Breath sounds: No stridor. No wheezing or rales.  Chest:     Chest wall: No tenderness.  Abdominal:     General: There is no distension.     Tenderness: There is no abdominal tenderness. There is no rebound.  Musculoskeletal:        General: No edema. Normal range of motion.     Cervical back: Neck supple.  Lymphadenopathy:     Cervical: No cervical adenopathy.  Skin:    Findings: No erythema or rash.  Neurological:     Mental Status: She is alert and oriented to person, place, and time.     Motor: No abnormal muscle tone.     Coordination: Coordination normal.  Psychiatric:        Mood and Affect: Mood and affect normal.        Behavior: Behavior normal.     ED Results / Procedures / Treatments   Labs (all labs ordered are listed, but only abnormal results are displayed) Labs Reviewed  COMPREHENSIVE METABOLIC PANEL - Abnormal; Notable for the following components:      Result Value   Sodium 133 (*)    Potassium 5.4 (*)    Glucose, Bld 202 (*)    BUN 27 (*)    Creatinine, Ser 1.78 (*)    GFR, Estimated 30 (*)    All other components within normal limits  CBC WITH DIFFERENTIAL/PLATELET - Abnormal; Notable for the following components:   RBC 3.76 (*)    Hemoglobin 10.9 (*)    HCT 33.6 (*)    All other components within normal limits  CULTURE, BLOOD (SINGLE)  URINE CULTURE  LACTIC ACID, PLASMA  LACTIC ACID, PLASMA  PROTIME-INR  APTT  URINALYSIS, ROUTINE W REFLEX MICROSCOPIC  OCCULT BLOOD X 1 CARD TO LAB, STOOL  TROPONIN I  (HIGH SENSITIVITY)  TROPONIN I (HIGH SENSITIVITY)    EKG None  Radiology CT Head Wo Contrast  Result Date: 11/15/2020 CLINICAL DATA:  70 year old female with concern for intracranial hemorrhage. EXAM: CT HEAD WITHOUT CONTRAST TECHNIQUE: Contiguous axial images were obtained from the base of the skull through the vertex without intravenous contrast. COMPARISON:  Head CT dated 09/24/2020. FINDINGS: Brain: Mild age-related atrophy and chronic microvascular ischemic changes. There is no acute intracranial hemorrhage. No mass effect midline shift. No extra-axial fluid collection. Vascular: No hyperdense vessel or unexpected calcification. Skull: Normal. Negative for fracture or focal lesion. Sinuses/Orbits: No acute finding. Other: None IMPRESSION: 1. No acute intracranial pathology. 2. Mild age-related atrophy and chronic microvascular ischemic changes. Electronically Signed   By: Anner Crete M.D.   On: 11/15/2020 21:17   DG Chest Port 1 View  Result Date: 11/15/2020 CLINICAL DATA:  Possible sepsis, syncopal episode EXAM: PORTABLE CHEST 1 VIEW COMPARISON:  11/02/2020, 04/08/2020 FINDINGS: No focal opacity or pleural effusion. Mild chronic bronchitic changes. Stable cardiomediastinal silhouette with aortic atherosclerosis. No pneumothorax. IMPRESSION: No active disease. Electronically Signed   By: Donavan Foil M.D.   On: 11/15/2020 20:49    Procedures Procedures   Medications Ordered in ED Medications  sodium chloride 0.9 % bolus 1,000 mL (1,000 mLs Intravenous New Bag/Given  11/15/20 2143)  acetaminophen (TYLENOL) tablet 650 mg (650 mg Oral Given 11/15/20 2322)  sodium chloride 0.9 % bolus 500 mL (500 mLs Intravenous New Bag/Given 11/15/20 2321)    ED Course  I have reviewed the triage vital signs and the nursing notes.  Pertinent labs & imaging results that were available during my care of the patient were reviewed by me and considered in my medical decision making (see chart for  details).    MDM Rules/Calculators/A&P                          Patient with orthostatic hypotension.  Patient was given a liter fluids and still was hypotensive and dehydrated.  She will be admitted to medicine Final Clinical Impression(s) / ED Diagnoses Final diagnoses:  Dehydration    Rx / DC Orders ED Discharge Orders    None       Milton Ferguson, MD 11/16/20 [phone]

## 2020-11-16 ENCOUNTER — Ambulatory Visit: Payer: PPO | Admitting: Family Medicine

## 2020-11-16 ENCOUNTER — Telehealth: Payer: Self-pay

## 2020-11-16 ENCOUNTER — Ambulatory Visit: Payer: PPO | Admitting: Cardiology

## 2020-11-16 DIAGNOSIS — I10 Essential (primary) hypertension: Secondary | ICD-10-CM

## 2020-11-16 DIAGNOSIS — R531 Weakness: Secondary | ICD-10-CM

## 2020-11-16 DIAGNOSIS — R55 Syncope and collapse: Secondary | ICD-10-CM | POA: Diagnosis not present

## 2020-11-16 DIAGNOSIS — E1165 Type 2 diabetes mellitus with hyperglycemia: Secondary | ICD-10-CM

## 2020-11-16 LAB — COMPREHENSIVE METABOLIC PANEL
ALT: 14 U/L (ref 0–44)
AST: 17 U/L (ref 15–41)
Albumin: 3.4 g/dL — ABNORMAL LOW (ref 3.5–5.0)
Alkaline Phosphatase: 42 U/L (ref 38–126)
Anion gap: 8 (ref 5–15)
BUN: 25 mg/dL — ABNORMAL HIGH (ref 8–23)
CO2: 24 mmol/L (ref 22–32)
Calcium: 9.3 mg/dL (ref 8.9–10.3)
Chloride: 104 mmol/L (ref 98–111)
Creatinine, Ser: 1.43 mg/dL — ABNORMAL HIGH (ref 0.44–1.00)
GFR, Estimated: 39 mL/min — ABNORMAL LOW (ref >60)
Glucose, Bld: 154 mg/dL — ABNORMAL HIGH (ref 70–99)
Potassium: 5 mmol/L (ref 3.5–5.1)
Sodium: 136 mmol/L (ref 135–145)
Total Bilirubin: 0.4 mg/dL (ref 0.3–1.2)
Total Protein: 6.5 g/dL (ref 6.5–8.1)

## 2020-11-16 LAB — URINALYSIS, ROUTINE W REFLEX MICROSCOPIC
Bilirubin Urine: NEGATIVE
Glucose, UA: NEGATIVE mg/dL
Ketones, ur: NEGATIVE mg/dL
Leukocytes,Ua: NEGATIVE
Nitrite: NEGATIVE
Protein, ur: NEGATIVE mg/dL
Specific Gravity, Urine: 1.009 (ref 1.005–1.030)
pH: 5 (ref 5.0–8.0)

## 2020-11-16 LAB — CBC
HCT: 32.6 % — ABNORMAL LOW (ref 36.0–46.0)
Hemoglobin: 10.4 g/dL — ABNORMAL LOW (ref 12.0–15.0)
MCH: 28.7 pg (ref 26.0–34.0)
MCHC: 31.9 g/dL (ref 30.0–36.0)
MCV: 90.1 fL (ref 80.0–100.0)
Platelets: 186 10*3/uL (ref 150–400)
RBC: 3.62 MIL/uL — ABNORMAL LOW (ref 3.87–5.11)
RDW: 14.5 % (ref 11.5–15.5)
WBC: 6.8 10*3/uL (ref 4.0–10.5)
nRBC: 0 % (ref 0.0–0.2)

## 2020-11-16 LAB — VITAMIN D 25 HYDROXY (VIT D DEFICIENCY, FRACTURES): Vit D, 25-Hydroxy: 90.28 ng/mL (ref 30–100)

## 2020-11-16 LAB — MAGNESIUM: Magnesium: 1.9 mg/dL (ref 1.7–2.4)

## 2020-11-16 LAB — TSH: TSH: 3.372 u[IU]/mL (ref 0.350–4.500)

## 2020-11-16 LAB — VITAMIN B12: Vitamin B-12: 141 pg/mL — ABNORMAL LOW (ref 180–914)

## 2020-11-16 MED ORDER — INSULIN ASPART 100 UNIT/ML ~~LOC~~ SOLN: 0.0000 [IU] | Freq: Three times a day (TID) | SUBCUTANEOUS | Status: DC

## 2020-11-16 MED ORDER — ISOSORBIDE MONONITRATE ER 60 MG PO TB24
60.0000 mg | ORAL_TABLET | Freq: Every day | ORAL | Status: DC
  Filled 2020-11-16: qty 1

## 2020-11-16 MED ORDER — ROSUVASTATIN CALCIUM 20 MG PO TABS
40.0000 mg | ORAL_TABLET | Freq: Every day | ORAL | Status: DC
  Filled 2020-11-16: qty 2

## 2020-11-16 MED ORDER — ONDANSETRON HCL 4 MG/2ML IJ SOLN: 4.0000 mg | Freq: Four times a day (QID) | INTRAMUSCULAR | Status: DC | PRN

## 2020-11-16 MED ORDER — HEPARIN SODIUM (PORCINE) 5000 UNIT/ML IJ SOLN
5000.0000 [IU] | Freq: Three times a day (TID) | INTRAMUSCULAR | Status: DC
  Administered 2020-11-16: 5000 [IU] via SUBCUTANEOUS
  Filled 2020-11-16: qty 1

## 2020-11-16 MED ORDER — ACETAMINOPHEN 325 MG PO TABS: 650.0000 mg | ORAL_TABLET | Freq: Four times a day (QID) | ORAL | Status: DC | PRN

## 2020-11-16 MED ORDER — SODIUM CHLORIDE 0.9 % IV SOLN: INTRAVENOUS | Status: DC

## 2020-11-16 MED ORDER — ACETAMINOPHEN 650 MG RE SUPP: 650.0000 mg | Freq: Four times a day (QID) | RECTAL | Status: DC | PRN

## 2020-11-16 MED ORDER — INSULIN DETEMIR 100 UNIT/ML ~~LOC~~ SOLN
30.0000 [IU] | Freq: Every day | SUBCUTANEOUS | Status: DC
  Filled 2020-11-16 (×2): qty 0.3

## 2020-11-16 MED ORDER — ISOSORBIDE MONONITRATE ER 60 MG PO TB24: 60.0000 mg | ORAL_TABLET | Freq: Every day | ORAL | 0 refills | Status: DC

## 2020-11-16 MED ORDER — OXYCODONE HCL 5 MG PO TABS
5.0000 mg | ORAL_TABLET | ORAL | Status: DC | PRN
Start: 2020-11-16 — End: 2020-11-16

## 2020-11-16 MED ORDER — ATENOLOL 25 MG PO TABS
50.0000 mg | ORAL_TABLET | Freq: Two times a day (BID) | ORAL | Status: DC
  Filled 2020-11-16: qty 2

## 2020-11-16 MED ORDER — INSULIN ASPART 100 UNIT/ML ~~LOC~~ SOLN: 0.0000 [IU] | Freq: Every day | SUBCUTANEOUS | Status: DC

## 2020-11-16 MED ORDER — SPIRONOLACTONE 25 MG PO TABS
25.0000 mg | ORAL_TABLET | Freq: Every day | ORAL | Status: DC
  Filled 2020-11-16: qty 1

## 2020-11-16 MED ORDER — ONDANSETRON HCL 4 MG PO TABS: 4.0000 mg | ORAL_TABLET | Freq: Four times a day (QID) | ORAL | Status: DC | PRN

## 2020-11-16 MED ORDER — ROPINIROLE HCL 0.25 MG PO TABS: 0.5000 mg | ORAL_TABLET | Freq: Every day | ORAL | Status: DC

## 2020-11-16 MED ORDER — CITALOPRAM HYDROBROMIDE 20 MG PO TABS
40.0000 mg | ORAL_TABLET | Freq: Every day | ORAL | Status: DC
  Filled 2020-11-16: qty 2

## 2020-11-16 MED ORDER — ASPIRIN 325 MG PO TABS
325.0000 mg | ORAL_TABLET | Freq: Every day | ORAL | Status: DC
  Filled 2020-11-16: qty 1

## 2020-11-16 MED ORDER — CLONAZEPAM 0.5 MG PO TABS: 0.5000 mg | ORAL_TABLET | Freq: Two times a day (BID) | ORAL | Status: DC | PRN

## 2020-11-16 NOTE — ED Notes (Addendum)
Pt ambulated to bathroom with x1 assist. Gait steady, no increased SOB, chest pain or dizziness. Pt appears to be in NAD.

## 2020-11-16 NOTE — Discharge Summary (Signed)
Physician Discharge Summary  [name] OBS:[id number] DOB: [birth year] DOA: 11/15/2020  PCP: Gwenlyn Perking, FNP  Admit date: 11/15/2020  Discharge date: 11/16/2020  Admitted From: Home  Disposition:  Home  Recommendations for Outpatient Follow-up:  1. Follow up with PCP in 1-2 weeks 2. Please obtain BMP/CBC in one week 3. Follow-up blood pressures in 1 week 4. Lasix spironolactone, and potassium supplementation as well as ACE inhibitor 5. Continue on Imdur at lower dose of 60 mg daily until further follow-up return for blood pressures  Home Health: None  Equipment/Devices: None  Discharge Condition:Stable  CODE STATUS: Full  Diet recommendation: Heart Healthy  Brief/Interim Summary: Per HPI:  [name]  is a 70 y.o. female, with history of TIA, PVD, multiple sclerosis, insulin-dependent diabetes mellitus, hyperlipidemia, GERD, CAD, and more presents the ED with a chief complaint of near syncope.  Patient reports that she nearly passed out at 11:30 AM on 11/15/2020.  She reports that happened just once.  She felt dizzy all the morning from the time that she woke up.  She reports that she took Klonopin for the dizziness but it did not help.  She reports that she was sitting in the recliner chair and just gotten up to walk across the room when she had palpitations, felt shaky, and passed out.  She denies any chest pain, or dyspnea prior to the event.  She reports that she has been sick for 2 months.  She was diagnosed with Covid in December and has not returned to baseline since that diagnosis.  She reports that she has had a poor appetite and poor p.o. intake.  She is been trying to eat 2 meals a day but she is only been drinking 1-1-1/2 bottles of water per day.  She admits to dysuria and that she had a UTI in January.  She reports they put her on 4 or 5 different kinds of antibiotics, but this last antibiotic seems to be working.  She thinks the dysuria is resolved now.  She denies  diarrhea.  She has no other complaints at this time.  Patient does not smoke, does not drink, does not use illicit drugs.  She has had Covid and she has been vaccinated for Covid.  She is full code.  -Patient was admitted with symptoms of near syncope and generalized weakness in the setting of dehydration with prerenal AKI and associated hyperkalemia.  She was noted to have positive orthostatic signs and had significant improvement after receiving IV fluid.  Her creatinine levels are downtrending as noted below and potassium is now within normal range.  She has been recommended to withhold any further potassium supplementation as well as home diuretics.  Her medications have been adjusted as noted above and she has been recommended to follow-up with her PCP in 1-2 weeks.  She otherwise feels back to her usual baseline and has been ambulating without any weakness or dizziness.  Repeat orthostatics have been within normal range.  She is stable for discharge.  Discharge Diagnoses:  Active Problems:   Hyperglycemia due to diabetes mellitus [facility])   Essential hypertension   Generalized weakness   Near syncope  Principal discharge diagnosis: Near syncope with weakness secondary to dehydration with prerenal AKI and associated hyperkalemia.  Discharge Instructions  Discharge Instructions    Diet - low sodium heart healthy   Complete by: As directed    Increase activity slowly   Complete by: As directed      Allergies as  of 11/16/2020      Reactions   Iohexol     Desc: pt had syncopal episode with nausea post IV CM late 1990's,  pt has had prednisone prep with heart caths x 2 without problem  kdean 04/16/07, Onset Date: [id number]   Ticlid [ticlopidine Hcl] Nausea And Vomiting   Codeine Nausea And Vomiting, Palpitations      Medication List    STOP taking these medications   furosemide 20 MG tablet Commonly known as: LASIX   lisinopril 40 MG tablet Commonly known as: ZESTRIL   potassium  chloride SA 20 MEQ tablet Commonly known as: KLOR-CON   spironolactone 25 MG tablet Commonly known as: ALDACTONE     TAKE these medications   amLODipine 10 MG tablet Commonly known as: NORVASC Take 1 tablet (10 mg total) by mouth daily.   aspirin 325 MG tablet Take 325 mg by mouth daily.   atenolol 50 MG tablet Commonly known as: TENORMIN Take 1 tablet (50 mg total) by mouth 2 (two) times daily.   calcium carbonate 600 MG Tabs tablet Commonly known as: OS-CAL Take 1 tablet (600 mg total) by mouth 2 (two) times daily with a meal.   citalopram 40 MG tablet Commonly known as: CELEXA TAKE ONE (1) TABLET EACH DAY What changed: See the new instructions.   clonazePAM 0.5 MG tablet Commonly known as: KLONOPIN Take 1 tablet (0.5 mg total) by mouth at bedtime as needed for anxiety.   empagliflozin 10 MG Tabs tablet Commonly known as: Jardiance Take 10 mg by mouth daily.   esomeprazole 40 MG capsule Commonly known as: NexIUM Take 1 capsule (40 mg total) by mouth daily.   fluconazole 150 MG tablet Commonly known as: Diflucan Take 1 tablet now. May take another tablet after finishing antibiotic.   Fluzone High-Dose Quadrivalent 0.7 ML Susy Generic drug: Influenza Vac High-Dose Quad   isosorbide mononitrate 120 MG 24 hr tablet Commonly known as: IMDUR TAKE ONE (1) TABLET EACH DAY What changed: See the new instructions.   isosorbide mononitrate 60 MG 24 hr tablet Commonly known as: IMDUR Take 1 tablet (60 mg total) by mouth daily. What changed: You were already taking a medication with the same name, and this prescription was added. Make sure you understand how and when to take each.   meclizine 25 MG tablet Commonly known as: ANTIVERT Take 1 tablet (25 mg total) by mouth 3 (three) times daily as needed for dizziness.   nitroGLYCERIN 0.4 MG SL tablet Commonly known as: NITROSTAT DISSOLVE 1 TABLET UNDER TONGUE FOR CHESTPAIN.MAY REPEAT EVERY 5 MINUTES FOR 3 DOSES.IF NO  RELIEF CALL 911 OR GO TO ER   NovoLOG FlexPen 100 UNIT/ML FlexPen Generic drug: insulin aspart INJECT 35-40 UNITS SQ 3 TIMES DAILY WITH MEALS What changed: See the new instructions.   ondansetron 8 MG tablet Commonly known as: ZOFRAN Take 0.5 tablets (4 mg total) by mouth every 6 (six) hours as needed.   Ozempic (0.25 or 0.5 MG/DOSE) 2 MG/1.5ML Sopn Generic drug: Semaglutide(0.25 or 0.5MG /DOS) Inject 0.375 mLs (0.5 mg total) into the skin once a week.   rOPINIRole 0.5 MG tablet Commonly known as: Requip Take 1 tablet (0.5 mg total) by mouth at bedtime.   rosuvastatin 40 MG tablet Commonly known as: CRESTOR TAKE ONE (1) TABLET EACH DAY What changed: See the new instructions.   Toujeo SoloStar 300 UNIT/ML Solostar Pen Generic drug: insulin glargine (1 Unit Dial) INJECT 50-60 UNITS SQ DAILY What changed: See the new  instructions.   vitamin B-12 1000 MCG tablet Commonly known as: CYANOCOBALAMIN Take 1,000 mcg by mouth daily.   Vitamin D (Ergocalciferol) 1.25 MG (50000 UNIT) Caps capsule Commonly known as: DRISDOL TAKE 1 CAPSULE EVERY 7 DAYS       Follow-up Information    Gwenlyn Perking, FNP Follow up in 1 week(s).   Specialty: Family Medicine Contact information: [address] [place] [address] [phone]              Allergies  Allergen Reactions  . Iohexol      Desc: pt had syncopal episode with nausea post IV CM late 1990's,  pt has had prednisone prep with heart caths x 2 without problem  kdean 04/16/07, Onset Date: [id number]   . Ticlid [Ticlopidine Hcl] Nausea And Vomiting  . Codeine Nausea And Vomiting and Palpitations    Consultations:  None   Procedures/Studies: DG Chest 2 View  Result Date: 11/02/2020 CLINICAL DATA:  COVID pneumonia EXAM: CHEST - 2 VIEW COMPARISON:  10/10/2020 FINDINGS: Lungs are well expanded, symmetric, and clear. No pneumothorax or pleural effusion. Cardiac size within normal limits. Pulmonary vascularity is normal.  Osseous structures are age-appropriate. No acute bone abnormality. IMPRESSION: No active cardiopulmonary disease. Electronically Signed   By: Fidela Salisbury MD   On: 11/02/2020 14:09   CT Head Wo Contrast  Result Date: 11/15/2020 CLINICAL DATA:  70 year old female with concern for intracranial hemorrhage. EXAM: CT HEAD WITHOUT CONTRAST TECHNIQUE: Contiguous axial images were obtained from the base of the skull through the vertex without intravenous contrast. COMPARISON:  Head CT dated 09/24/2020. FINDINGS: Brain: Mild age-related atrophy and chronic microvascular ischemic changes. There is no acute intracranial hemorrhage. No mass effect midline shift. No extra-axial fluid collection. Vascular: No hyperdense vessel or unexpected calcification. Skull: Normal. Negative for fracture or focal lesion. Sinuses/Orbits: No acute finding. Other: None IMPRESSION: 1. No acute intracranial pathology. 2. Mild age-related atrophy and chronic microvascular ischemic changes. Electronically Signed   By: Anner Crete M.D.   On: 11/15/2020 21:17   DG Chest Port 1 View  Result Date: 11/15/2020 CLINICAL DATA:  Possible sepsis, syncopal episode EXAM: PORTABLE CHEST 1 VIEW COMPARISON:  11/02/2020, 04/08/2020 FINDINGS: No focal opacity or pleural effusion. Mild chronic bronchitic changes. Stable cardiomediastinal silhouette with aortic atherosclerosis. No pneumothorax. IMPRESSION: No active disease. Electronically Signed   By: Donavan Foil M.D.   On: 11/15/2020 20:49      Discharge Exam: Vitals:   11/16/20 0630 11/16/20 0817  BP: 128/61 132/66  Pulse: 69 76  Resp: 13 18  Temp:    SpO2: 98% 97%   Vitals:   11/16/20 0130 11/16/20 0417 11/16/20 0630 11/16/20 0817  BP: (!) 100/50 92/69 128/61 132/66  Pulse: 71 72 69 76  Resp: 15 15 13 18   Temp:      SpO2: 96% 98% 98% 97%  Weight:      Height:        General: Pt is alert, awake, not in acute distress Cardiovascular: RRR, S1/S2 +, no rubs, no  gallops Respiratory: CTA bilaterally, no wheezing, no rhonchi Abdominal: Soft, NT, ND, bowel sounds + Extremities: no edema, no cyanosis    The results of significant diagnostics from this hospitalization (including imaging, microbiology, ancillary and laboratory) are listed below for reference.     Microbiology: Recent Results (from the past 240 hour(s))  Blood culture (routine single)     Status: None (Preliminary result)   Collection Time: 11/15/20  8:14 PM  Specimen: BLOOD  Result Value Ref Range Status   Specimen Description BLOOD RIGHT ANTECUBITAL  Final   Special Requests   Final    BOTTLES DRAWN AEROBIC AND ANAEROBIC Blood Culture adequate volume   Culture   Final    NO GROWTH < 12 HOURS Performed at [facility], [address] [place], [place] [address]    Report Status PENDING  Incomplete     Labs: BNP (last 3 results) Recent Labs    10/10/20 2349  BNP 95.6   Basic Metabolic Panel: Recent Labs  Lab 11/15/20 2014 11/16/20 0357  NA 133* 136  K 5.4* 5.0  CL 98 104  CO2 26 24  GLUCOSE 202* 154*  BUN 27* 25*  CREATININE 1.78* 1.43*  CALCIUM 9.9 9.3  MG  --  1.9   Liver Function Tests: Recent Labs  Lab 11/15/20 2014 11/16/20 0357  AST 19 17  ALT 15 14  ALKPHOS 47 42  BILITOT 0.8 0.4  PROT 7.6 6.5  ALBUMIN 4.0 3.4*   No results for input(s): LIPASE, AMYLASE in the last 168 hours. No results for input(s): AMMONIA in the last 168 hours. CBC: Recent Labs  Lab 11/15/20 2014 11/16/20 0357  WBC 8.4 6.8  NEUTROABS 5.1  --   HGB 10.9* 10.4*  HCT 33.6* 32.6*  MCV 89.4 90.1  PLT 238 186   Cardiac Enzymes: No results for input(s): CKTOTAL, CKMB, CKMBINDEX, TROPONINI in the last 168 hours. BNP: Invalid input(s): POCBNP CBG: No results for input(s): GLUCAP in the last 168 hours. D-Dimer No results for input(s): DDIMER in the last 72 hours. Hgb A1c No results for input(s): HGBA1C in the last 72 hours. Lipid Profile No results for input(s):  CHOL, HDL, LDLCALC, TRIG, CHOLHDL, LDLDIRECT in the last 72 hours. Thyroid function studies Recent Labs    11/15/20 2014  TSH 3.372   Anemia work up Recent Labs    11/15/20 2014  VITAMINB12 141*   Urinalysis    Component Value Date/Time   COLORURINE YELLOW 11/16/2020 0128   APPEARANCEUR CLEAR 11/16/2020 0128   APPEARANCEUR Cloudy (A) 10/27/2020 1340   LABSPEC 1.009 11/16/2020 0128   PHURINE 5.0 11/16/2020 0128   GLUCOSEU NEGATIVE 11/16/2020 0128   HGBUR SMALL (A) 11/16/2020 0128   BILIRUBINUR NEGATIVE 11/16/2020 0128   BILIRUBINUR Negative 10/27/2020 1340   KETONESUR NEGATIVE 11/16/2020 0128   PROTEINUR NEGATIVE 11/16/2020 0128   UROBILINOGEN negative 10/11/2014 1109   UROBILINOGEN 2.0 (H) 08/14/2014 1157   NITRITE NEGATIVE 11/16/2020 0128   LEUKOCYTESUR NEGATIVE 11/16/2020 0128   Sepsis Labs Invalid input(s): PROCALCITONIN,  WBC,  LACTICIDVEN Microbiology Recent Results (from the past 240 hour(s))  Blood culture (routine single)     Status: None (Preliminary result)   Collection Time: 11/15/20  8:14 PM   Specimen: BLOOD  Result Value Ref Range Status   Specimen Description BLOOD RIGHT ANTECUBITAL  Final   Special Requests   Final    BOTTLES DRAWN AEROBIC AND ANAEROBIC Blood Culture adequate volume   Culture   Final    NO GROWTH < 12 HOURS Performed at [facility], [address] [place], [place] [address]    Report Status PENDING  Incomplete     Time coordinating discharge: 35 minutes  SIGNED:   Rodena Goldmann, DO [facility] 11/16/2020, 8:35 AM  If 7PM-7AM, please contact night-coverage [email or web address]

## 2020-11-16 NOTE — Telephone Encounter (Signed)
  Contact Date: 11/16/2020 Contacted By: Georgina Pillion, lpn   Transition Care Management Follow-up Telephone Call  Date of discharge and from where: 11/16/20 [facility]  Discharge [place]   How have you been since you were released from the hospital? [facility]   Any questions or concerns? No   Items Reviewed:  Did the pt receive and understand the discharge instructions provided? Yes   Medications obtained and verified? Yes   Any new allergies since your discharge? No   Dietary orders reviewed? Yes  Do you have support at home? Yes   Discontinued Medications furosemide 20 MG tablet Commonly known as: LASIX   lisinopril 40 MG tablet Commonly known as: ZESTRIL   potassium chloride SA 20 MEQ tablet Commonly known as: KLOR-CON   spironolactone 25 MG tablet Commonly known as: ALDACTONE    New Medications Added reviewed  Current Medication List Allergies as of 11/16/2020      Reactions   Iohexol     Desc: pt had syncopal episode with nausea post IV CM late 1990's,  pt has had prednisone prep with heart caths x 2 without problem  kdean 04/16/07, Onset Date: [id number]   Ticlid [ticlopidine Hcl] Nausea And Vomiting   Codeine Nausea And Vomiting, Palpitations      Medication List    Notice   This visit is during an admission. Changes to the med list made in this visit will be reflected in the After Visit Summary of the admission.      Home Care and Equipment/Supplies: Were home health services ordered? no If so, what is the name of the agency? n/a  Has the agency set up a time to come to the patient's home? not applicable Were any new equipment or medical supplies ordered?  No What is the name of the medical supply agency? n/a Were you able to get the supplies/equipment? no Do you have any questions related to the use of the equipment or supplies? No  Functional Questionnaire: (I = Independent and D = Dependent) ADLs: i  Bathing/Dressing-  i  Meal Prep- i  Eating- i  Maintaining continence- i  Transferring/Ambulation- i  Managing Meds- i  Follow up appointments reviewed:   PCP Hospital f/u appt confirmed? Yes  Scheduled to see [facility]  on 12/13/20 @ 100 pm .  [facility] f/u appt confirmed? No   Are transportation arrangements needed? No   If their condition worsens, is the pt aware to call PCP or go to the Emergency Dept.? Yes  Was the patient provided with contact information for the PCP's office or ED? Yes  Was to pt encouraged to call back with questions or concerns? Yes

## 2020-11-16 NOTE — ED Notes (Signed)
Patient denies any dizziness upon standing.  Patient ambulatory to beside commode without assistance.

## 2020-11-16 NOTE — H&P (Signed)
TRH H&P    Patient Demographics:    [name], is a 70 y.o. female  MRN: [id number]  DOB - Mar 05, 1951  Admit Date - 11/15/2020  Referring MD/NP/PA: Roderic Palau  Outpatient Primary MD for the patient is Gwenlyn Perking, FNP  Patient coming from: Home  Chief complaint- near syncope   HPI:    [name]  is a 70 y.o. female, with history of TIA, PVD, multiple sclerosis, insulin-dependent diabetes mellitus, hyperlipidemia, GERD, CAD, and more presents the ED with a chief complaint of near syncope.  Patient reports that she nearly passed out at 11:30 AM on 11/15/2020.  She reports that happened just once.  She felt dizzy all the morning from the time that she woke up.  She reports that she took Klonopin for the dizziness but it did not help.  She reports that she was sitting in the recliner chair and just gotten up to walk across the room when she had palpitations, felt shaky, and passed out.  She denies any chest pain, or dyspnea prior to the event.  She reports that she has been sick for 2 months.  She was diagnosed with Covid in December and has not returned to baseline since that diagnosis.  She reports that she has had a poor appetite and poor p.o. intake.  She is been trying to eat 2 meals a day but she is only been drinking 1-1-1/2 bottles of water per day.  She admits to dysuria and that she had a UTI in January.  She reports they put her on 4 or 5 different kinds of antibiotics, but this last antibiotic seems to be working.  She thinks the dysuria is resolved now.  She denies diarrhea.  She has no other complaints at this time.  Patient does not smoke, does not drink, does not use illicit drugs.  She has had Covid and she has been vaccinated for Covid.  She is full code.  In the ED Temp 98.3, heart rate 69, respiratory rate 14-20, blood pressure standing is 88/53, lying is 134/59, satting at 98% After patient's  orthostatic vital signs she was given a liter fluid and she remained orthostatic after liter of fluid. Patient is CT of her head because she did report a fall 1 week ago where she hit her head.  CT had no acute findings Chest x-ray had no acute findings Tropes were flat at 4 and 4 Lactic acid 1.4 White blood cell count 8.4, hemoglobin 10.9 Chemistry panel reveals a hyperkalemia at 5.4, and AKI with a creatinine of 1.78, baseline is 1.20 After another 500 mL bolus, and maintenance fluids patient was able to ambulate to the bathroom no acute distress Admission requested for further hydration and was    Review of systems:    In addition to the HPI above,  No Fever-chills, admits to generalized weakness No Headache, No changes with Vision or hearing, No problems swallowing food or Liquids, No Chest pain, Cough or Shortness of Breath, admits to palpitations No Abdominal pain, No Nausea or Vomiting,  bowel movements are regular, No Blood in stool or Urine, No dysuria, No new skin rashes or bruises, No new joints pains-aches,  No new asymmetric weakness, tingling, numbness in any extremity, No recent weight gain or loss, No polyuria, polydypsia or polyphagia, No significant Mental Stressors.  All other systems reviewed and are negative.    Past History of the following :    Past Medical History:  Diagnosis Date  . Anxiety   . CAD (coronary artery disease)    DES to circumflex 02/2007, BMS to LAD and PTCA diagonal 03/2007  . Carotid artery plaque    Mild  . Cataract   . Depression   . Diverticulitis, colon   . Elevated d-dimer 01/08/2014  . Essential hypertension, benign   . GERD (gastroesophageal reflux disease)   . H/O hiatal hernia   . HLD (hyperlipidemia)   . IDDM (insulin dependent diabetes mellitus)   . Migraine    "used to have them really bad; don't have them anymore" (01/07/2014)  . MS (multiple sclerosis) ([place])    Not confirmed  . PAT (paroxysmal atrial tachycardia)  ([place])   . Prolapse of uterus   . PVD (peripheral vascular disease) ([place])   . TIA (transient ischemic attack) 1980's      Past Surgical History:  Procedure Laterality Date  . ABDOMINAL HYSTERECTOMY  1986   ovaries remain - prolaspe uterus   . APPENDECTOMY  ~ 1970  . BREAST BIOPSY Right 1980's  . BREAST LUMPECTOMY Right 1980's   Dr. Charlynne Pander   . CARDIAC CATHETERIZATION  01/07/2014  . CHOLECYSTECTOMY  ?1987  . COLONOSCOPY  2002   Dr. Anwar--> Severe diverticular changes in the region of the sigmoid and descending colon with scattered diverticular changes throughout the rest of the colon. No polyps, ulcerations. Despite numerous manipulations, the tip of the scope could not be tipped into the cecal area.  . COLONOSCOPY  01/10/2012   Procedure: COLONOSCOPY;  Surgeon: Daneil Dolin, MD;  [facility];  Service: Endoscopy;  Laterality: N/A;  1:55  . CORONARY ANGIOPLASTY WITH STENT PLACEMENT  ~ 1997 X 2   "2 + 1"  . EYE SURGERY Bilateral 2014   cataract  . INTRAVASCULAR PRESSURE WIRE/FFR STUDY N/A 03/08/2017   Procedure: Intravascular Pressure Wire/FFR Study;  Surgeon: Nelva Bush, MD;  Location: [facility];  Service: Cardiovascular;  Laterality: N/A;  . LEFT HEART CATH AND CORONARY ANGIOGRAPHY N/A 03/08/2017   Procedure: Left Heart Cath and Coronary Angiography;  Surgeon: Nelva Bush, MD;  Location: [facility];  Service: Cardiovascular;  Laterality: N/A;  . LEFT HEART CATHETERIZATION WITH CORONARY ANGIOGRAM N/A 01/07/2014   Procedure: LEFT HEART CATHETERIZATION WITH CORONARY ANGIOGRAM;  Surgeon: Larey Dresser, MD;  Location: [facility] CATH LAB;  Service: Cardiovascular;  Laterality: N/A;      Social History:      Social History   Tobacco Use  . Smoking status: Never Smoker  . Smokeless tobacco: Never Used  . Tobacco comment: spouse, 58 years - husband has quit 01/2011  Substance Use Topics  . Alcohol use: No       Family History :     Family  History  Problem Relation Age of Onset  . Heart attack Mother 54  . Diabetes Mother   . Hypertension Mother   . Heart attack Father 36  . Heart attack Brother 32       x 6  . Heart disease Brother   .  Diabetes Brother   . Colon cancer Paternal Aunt        41s, died with brain anuerysm  . Crohn's disease Cousin        paternal  . Diabetes Sister   . GER disease Daughter   . Cervical cancer Daughter   . Diabetes Daughter       Home Medications:   Prior to Admission medications   Medication Sig Start Date End Date Taking? Authorizing Provider  amLODipine (NORVASC) 10 MG tablet Take 1 tablet (10 mg total) by mouth daily. 10/12/20  Yes Emokpae, Courage, MD  aspirin 325 MG tablet Take 325 mg by mouth daily.   Yes [provider]  atenolol (TENORMIN) 50 MG tablet Take 1 tablet (50 mg total) by mouth 2 (two) times daily. 01/13/19  Yes Chipper Herb, MD  calcium carbonate (OS-CAL) 600 MG TABS tablet Take 1 tablet (600 mg total) by mouth 2 (two) times daily with a meal. 07/19/20  Yes Gwenlyn Perking, FNP  citalopram (CELEXA) 40 MG tablet TAKE ONE (1) TABLET EACH DAY Patient taking differently: Take 40 mg by mouth daily. 11/03/20  Yes Gwenlyn Perking, FNP  empagliflozin (JARDIANCE) 10 MG TABS tablet Take 10 mg by mouth daily. 02/20/19  Yes Chipper Herb, MD  esomeprazole (NEXIUM) 40 MG capsule Take 1 capsule (40 mg total) by mouth daily. 03/17/20  Yes Hendricks Limes F, FNP  fluconazole (DIFLUCAN) 150 MG tablet Take 1 tablet now. May take another tablet after finishing antibiotic. 11/02/20  Yes Gwenlyn Perking, FNP  isosorbide mononitrate (IMDUR) 120 MG 24 hr tablet TAKE ONE (1) TABLET EACH DAY Patient taking differently: Take 120 mg by mouth daily. TAKE ONE (1) TABLET EACH DAY 03/22/20  Yes Dettinger, Fransisca Kaufmann, MD  lisinopril (ZESTRIL) 40 MG tablet Take 1 tablet (40 mg total) by mouth daily. 01/13/19  Yes Chipper Herb, MD  meclizine (ANTIVERT) 25 MG tablet Take 1 tablet (25 mg  total) by mouth 3 (three) times daily as needed for dizziness. 11/26/19  Yes Dettinger, Fransisca Kaufmann, MD  nitroGLYCERIN (NITROSTAT) 0.4 MG SL tablet DISSOLVE 1 TABLET UNDER TONGUE FOR CHESTPAIN.MAY REPEAT EVERY 5 MINUTES FOR 3 DOSES.IF NO RELIEF CALL 911 OR GO TO ER 06/08/20  Yes Lilia Pro, Tiffany M, FNP  NOVOLOG FLEXPEN 100 UNIT/ML FlexPen INJECT 35-40 UNITS SQ 3 TIMES DAILY WITH MEALS Patient taking differently: 8-30 Units. Takes 8-30 units TID with meals depending on glucose 09/15/19  Yes Dettinger, Fransisca Kaufmann, MD  potassium chloride SA (KLOR-CON) 20 MEQ tablet TAKE TWO TABLETS BY MOUTH DAILY 09/20/20  Yes Gwenlyn Perking, FNP  rOPINIRole (REQUIP) 0.5 MG tablet Take 1 tablet (0.5 mg total) by mouth at bedtime. 09/30/19  Yes Dettinger, Fransisca Kaufmann, MD  rosuvastatin (CRESTOR) 40 MG tablet TAKE ONE (1) TABLET EACH DAY Patient taking differently: Take 40 mg by mouth daily. 04/14/20  Yes Dettinger, Fransisca Kaufmann, MD  Semaglutide,0.25 or 0.5MG /DOS, (OZEMPIC, 0.25 OR 0.5 MG/DOSE,) 2 MG/1.5ML SOPN Inject 0.375 mLs (0.5 mg total) into the skin once a week. 04/20/20  Yes Dettinger, Fransisca Kaufmann, MD  spironolactone (ALDACTONE) 25 MG tablet TAKE ONE (1) TABLET EACH DAY Patient taking differently: Take 25 mg by mouth daily. 09/27/20  Yes Morgan, Tiffany M, FNP  TOUJEO SOLOSTAR 300 UNIT/ML SOPN INJECT 50-60 UNITS SQ DAILY Patient taking differently: Inject 50 Units into the skin daily. 09/15/19  Yes Dettinger, Fransisca Kaufmann, MD  vitamin B-12 (CYANOCOBALAMIN) 1000 MCG tablet Take 1,000 mcg by mouth daily.  Yes [provider]  Vitamin D, Ergocalciferol, (DRISDOL) 1.25 MG (50000 UNIT) CAPS capsule TAKE 1 CAPSULE EVERY 7 DAYS 07/19/20  Yes Gwenlyn Perking, FNP  clonazePAM (KLONOPIN) 0.5 MG tablet Take 1 tablet (0.5 mg total) by mouth at bedtime as needed for anxiety. Patient not taking: Reported on 11/15/2020 06/17/19   Dettinger, Fransisca Kaufmann, MD  FLUZONE HIGH-DOSE QUADRIVALENT 0.7 ML SUSY  06/25/20   [provider]  furosemide  (LASIX) 20 MG tablet TAKE ONE (1) TABLET EACH DAY Patient taking differently: Take 20 mg by mouth daily. 09/27/20   Gwenlyn Perking, FNP  ondansetron (ZOFRAN) 8 MG tablet Take 0.5 tablets (4 mg total) by mouth every 6 (six) hours as needed. Patient not taking: Reported on 11/15/2020 09/28/20   Gwenlyn Perking, FNP     Allergies:     Allergies  Allergen Reactions  . Iohexol      Desc: pt had syncopal episode with nausea post IV CM late 1990's,  pt has had prednisone prep with heart caths x 2 without problem  kdean 04/16/07, Onset Date: [id number]   . Ticlid [Ticlopidine Hcl] Nausea And Vomiting  . Codeine Nausea And Vomiting and Palpitations     Physical Exam:   Vitals  Blood pressure 92/69, pulse 72, temperature 98.3 F (36.8 C), resp. rate 15, height 5\' 6"  (1.676 m), weight 70.8 kg, SpO2 98 %.  1.  General: Patient lying supine in bed in no acute distress  2. Psychiatric: Mood and behavior normal for situation, alert and oriented x3, pleasant and cooperative with exam  3. Neurologic: Cranial nerves II through XII are grossly intact, moves all 4 extremities voluntarily, speech and language are normal, no focal deficit on limited exam  4. HEENMT:  Head is atraumatic, normocephalic, pupils reactive to light, neck is supple, trachea is midline, mucous membranes are mildly dry  5. Respiratory : Lungs are clear to auscultation bilaterally without wheezing, rhonchi, crackles  6. Cardiovascular : Heart rate is normal, rhythm is regular, no murmurs rubs or gallops  7. Gastrointestinal:  Abdomen is soft, nondistended, nontender to palpation  8. Skin:  Skin is warm dry and intact without acute lesion on limited exam  9.Musculoskeletal:  No calf tenderness, no acute deformity, no peripheral edema    Data Review:    CBC Recent Labs  Lab 11/15/20 2014 11/16/20 0357  WBC 8.4 6.8  HGB 10.9* 10.4*  HCT 33.6* 32.6*  PLT 238 186  MCV 89.4 90.1  MCH 29.0 28.7  MCHC 32.4  31.9  RDW 14.3 14.5  LYMPHSABS 2.8  --   MONOABS 0.4  --   EOSABS 0.1  --   BASOSABS 0.0  --    ------------------------------------------------------------------------------------------------------------------  Results for orders placed or performed during the hospital encounter of 11/15/20 (from the past 48 hour(s))  Lactic acid, plasma     Status: None   Collection Time: 11/15/20  8:14 PM  Result Value Ref Range   Lactic Acid, Venous 1.4 0.5 - 1.9 mmol/L    Comment: Performed at [facility], [address] [place], [place] [address]  Comprehensive metabolic panel     Status: Abnormal   Collection Time: 11/15/20  8:14 PM  Result Value Ref Range   Sodium 133 (L) 135 - 145 mmol/L   Potassium 5.4 (H) 3.5 - 5.1 mmol/L   Chloride 98 98 - 111 mmol/L   CO2 26 22 - 32 mmol/L   Glucose, Bld 202 (H) 70 - 99 mg/dL  Comment: Glucose reference range applies only to samples taken after fasting for at least 8 hours.   BUN 27 (H) 8 - 23 mg/dL   Creatinine, Ser 1.78 (H) 0.44 - 1.00 mg/dL   Calcium 9.9 8.9 - 10.3 mg/dL   Total Protein 7.6 6.5 - 8.1 g/dL   Albumin 4.0 3.5 - 5.0 g/dL   AST 19 15 - 41 U/L   ALT 15 0 - 44 U/L   Alkaline Phosphatase 47 38 - 126 U/L   Total Bilirubin 0.8 0.3 - 1.2 mg/dL   GFR, Estimated 30 (L) >60 mL/min    Comment: (NOTE) Calculated using the CKD-EPI Creatinine Equation (2021)    Anion gap 9 5 - 15    Comment: Performed at [facility], [address] [place], [place] [address]  CBC WITH DIFFERENTIAL     Status: Abnormal   Collection Time: 11/15/20  8:14 PM  Result Value Ref Range   WBC 8.4 4.0 - 10.5 K/uL   RBC 3.76 (L) 3.87 - 5.11 MIL/uL   Hemoglobin 10.9 (L) 12.0 - 15.0 g/dL   HCT 33.6 (L) 36.0 - 46.0 %   MCV 89.4 80.0 - 100.0 fL   MCH 29.0 26.0 - 34.0 pg   MCHC 32.4 30.0 - 36.0 g/dL   RDW 14.3 11.5 - 15.5 %   Platelets 238 150 - 400 K/uL   nRBC 0.0 0.0 - 0.2 %   Neutrophils Relative % 61 %   Neutro Abs 5.1 1.7 - 7.7 K/uL   Lymphocytes Relative  33 %   Lymphs Abs 2.8 0.7 - 4.0 K/uL   Monocytes Relative 5 %   Monocytes Absolute 0.4 0.1 - 1.0 K/uL   Eosinophils Relative 1 %   Eosinophils Absolute 0.1 0.0 - 0.5 K/uL   Basophils Relative 0 %   Basophils Absolute 0.0 0.0 - 0.1 K/uL   Immature Granulocytes 0 %   Abs Immature Granulocytes 0.02 0.00 - 0.07 K/uL    Comment: Performed at [facility], [address] [place], [place] [address]  Protime-INR     Status: None   Collection Time: 11/15/20  8:14 PM  Result Value Ref Range   Prothrombin Time 13.5 11.4 - 15.2 seconds   INR 1.1 0.8 - 1.2    Comment: (NOTE) INR goal varies based on device and disease states. Performed at [facility], [address] [place], [place] [address]   APTT     Status: None   Collection Time: 11/15/20  8:14 PM  Result Value Ref Range   aPTT 29 24 - 36 seconds    Comment: Performed at [facility], [address] [place], [place] [address]  Troponin I (High Sensitivity)     Status: None   Collection Time: 11/15/20  8:14 PM  Result Value Ref Range   Troponin I (High Sensitivity) 4 <18 ng/L    Comment: (NOTE) Elevated high sensitivity troponin I (hsTnI) values and significant  changes across serial measurements may suggest ACS but many other  chronic and acute conditions are known to elevate hsTnI results.  Refer to the "Links" section for chest pain algorithms and additional  guidance. Performed at [facility], [address], [place], [place] [address]   TSH     Status: None   Collection Time: 11/15/20  8:14 PM  Result Value Ref Range   TSH 3.372 0.350 - 4.500 uIU/mL    Comment: Performed by a 3rd Generation assay with a functional sensitivity of <=0.01 uIU/mL.  Performed at [facility], [address] [place], [place] [address]   Vitamin B12     Status: Abnormal   Collection Time: 11/15/20  8:14 PM  Result Value Ref Range   Vitamin B-12 141 (L) 180 - 914 pg/mL    Comment: (NOTE) This assay is not validated for testing neonatal  or myeloproliferative syndrome specimens for Vitamin B12 levels. Performed at [facility], [address] [place], [place] [address]   Lactic acid, plasma     Status: None   Collection Time: 11/15/20 10:10 PM  Result Value Ref Range   Lactic Acid, Venous 1.5 0.5 - 1.9 mmol/L    Comment: Performed at [facility], [address], [place], [place] [address]  Troponin I (High Sensitivity)     Status: None   Collection Time: 11/15/20 10:10 PM  Result Value Ref Range   Troponin I (High Sensitivity) 4 <18 ng/L    Comment: (NOTE) Elevated high sensitivity troponin I (hsTnI) values and significant  changes across serial measurements may suggest ACS but many other  chronic and acute conditions are known to elevate hsTnI results.  Refer to the "Links" section for chest pain algorithms and additional  guidance. Performed at [facility], [address] [place], [place] [address]   CBC     Status: Abnormal   Collection Time: 11/16/20  3:57 AM  Result Value Ref Range   WBC 6.8 4.0 - 10.5 K/uL   RBC 3.62 (L) 3.87 - 5.11 MIL/uL   Hemoglobin 10.4 (L) 12.0 - 15.0 g/dL   HCT 32.6 (L) 36.0 - 46.0 %   MCV 90.1 80.0 - 100.0 fL   MCH 28.7 26.0 - 34.0 pg   MCHC 31.9 30.0 - 36.0 g/dL   RDW 14.5 11.5 - 15.5 %   Platelets 186 150 - 400 K/uL   nRBC 0.0 0.0 - 0.2 %    Comment: Performed at [facility], [address] [place], [place] [address]    Chemistries  Recent Labs  Lab 11/15/20 2014  NA 133*  K 5.4*  CL 98  CO2 26  GLUCOSE 202*  BUN 27*  CREATININE 1.78*  CALCIUM 9.9  AST 19  ALT 15  ALKPHOS 47  BILITOT 0.8   ------------------------------------------------------------------------------------------------------------------  ------------------------------------------------------------------------------------------------------------------ GFR: Estimated Creatinine Clearance: 27.1 mL/min (A) (by C-G formula based on SCr of 1.78 mg/dL (H)). Liver Function Tests: Recent Labs  Lab  11/15/20 2014  AST 19  ALT 15  ALKPHOS 47  BILITOT 0.8  PROT 7.6  ALBUMIN 4.0   No results for input(s): LIPASE, AMYLASE in the last 168 hours. No results for input(s): AMMONIA in the last 168 hours. Coagulation Profile: Recent Labs  Lab 11/15/20 2014  INR 1.1   Cardiac Enzymes: No results for input(s): CKTOTAL, CKMB, CKMBINDEX, TROPONINI in the last 168 hours. BNP (last 3 results) No results for input(s): PROBNP in the last 8760 hours. HbA1C: No results for input(s): HGBA1C in the last 72 hours. CBG: No results for input(s): GLUCAP in the last 168 hours. Lipid Profile: No results for input(s): CHOL, HDL, LDLCALC, TRIG, CHOLHDL, LDLDIRECT in the last 72 hours. Thyroid Function Tests: Recent Labs    11/15/20 2014  TSH 3.372   Anemia Panel: Recent Labs    11/15/20 2014  VITAMINB12 141*    --------------------------------------------------------------------------------------------------------------- Urine analysis:    Component Value Date/Time   COLORURINE YELLOW 10/10/2020 2352   APPEARANCEUR Cloudy (A) 10/27/2020 1340   LABSPEC 1.010 10/10/2020 2352   PHURINE  6.0 10/10/2020 2352   GLUCOSEU 3+ (A) 10/27/2020 1340   HGBUR SMALL (A) 10/10/2020 2352   BILIRUBINUR Negative 10/27/2020 1340   KETONESUR NEGATIVE 10/10/2020 2352   PROTEINUR Negative 10/27/2020 1340   PROTEINUR 30 (A) 10/10/2020 2352   UROBILINOGEN negative 10/11/2014 1109   UROBILINOGEN 2.0 (H) 08/14/2014 1157   NITRITE Negative 10/27/2020 1340   NITRITE NEGATIVE 10/10/2020 2352   LEUKOCYTESUR 2+ (A) 10/27/2020 1340   LEUKOCYTESUR LARGE (A) 10/10/2020 2352      Imaging Results:    CT Head Wo Contrast  Result Date: 11/15/2020 CLINICAL DATA:  70 year old female with concern for intracranial hemorrhage. EXAM: CT HEAD WITHOUT CONTRAST TECHNIQUE: Contiguous axial images were obtained from the base of the skull through the vertex without intravenous contrast. COMPARISON:  Head CT dated 09/24/2020.  FINDINGS: Brain: Mild age-related atrophy and chronic microvascular ischemic changes. There is no acute intracranial hemorrhage. No mass effect midline shift. No extra-axial fluid collection. Vascular: No hyperdense vessel or unexpected calcification. Skull: Normal. Negative for fracture or focal lesion. Sinuses/Orbits: No acute finding. Other: None IMPRESSION: 1. No acute intracranial pathology. 2. Mild age-related atrophy and chronic microvascular ischemic changes. Electronically Signed   By: Anner Crete M.D.   On: 11/15/2020 21:17   DG Chest Port 1 View  Result Date: 11/15/2020 CLINICAL DATA:  Possible sepsis, syncopal episode EXAM: PORTABLE CHEST 1 VIEW COMPARISON:  11/02/2020, 04/08/2020 FINDINGS: No focal opacity or pleural effusion. Mild chronic bronchitic changes. Stable cardiomediastinal silhouette with aortic atherosclerosis. No pneumothorax. IMPRESSION: No active disease. Electronically Signed   By: Donavan Foil M.D.   On: 11/15/2020 20:49    My personal review of EKG: Rhythm NSR, Rate 86/min, QTc 498 ,no Acute ST changes   Assessment & Plan:    Active Problems:   Hyperglycemia due to diabetes mellitus (HCC)   Essential hypertension   Generalized weakness   Near syncope   1. Near syncope 1. Patient reports falling down but not a complete loss of consciousness 2. History is consistent with orthostatic hypotension 3. Orthostatic vitals positive in the ED 4. 1.5 L bolus given in the ED followed by maintenance fluids at 75 mils per hour 5. Holding off on further syncope work-up at this time as orthostatic hypotension is the most likely differential 6. Check orthostatics every shift 2. Generalized weakness 1. Likely secondary to dehydration, AKI, electrolyte abnormalities 2. Tropes were normal 3. No signs of infection 4. Continue to measure orthostatic vitals 5. Check TSH, vitamin D, B12 6. Hemoglobin stable at 10.9 7. Consider PT eval and treat if patient does not have  improvement with the current treatments  3. Hyperkalemia and AKI 1. Hyperkalemia as a result of AKI 2. AKI as a result of dehydration secondary to poor p.o. intake 3. Fluids as above 4. Trend in the a.m. 4. Orthostatic hypotension 1. Continue fluids as above, hold Lasix, hold lisinopril, hold amlodipine 5. CAD/CHF 1. Continue atenolol, aspirin, Imdur, statin, spironolactone 6. Diabetes mellitus type 2 1. 50 units of Toujeo at home 2. Continue basal insulin and sliding scale coverage   DVT Prophylaxis-  heparin - SCDs   AM Labs Ordered, also please review Full Orders  Family Communication: No family at bedside  Code Status: Full Admission status: ObservationTime spent in minutes : [place] DO

## 2020-11-18 LAB — URINE CULTURE: Culture: NO GROWTH

## 2020-11-20 LAB — CULTURE, BLOOD (SINGLE)
Culture: NO GROWTH
Special Requests: ADEQUATE

## 2020-11-23 ENCOUNTER — Encounter: Payer: Self-pay | Admitting: Family Medicine

## 2020-11-23 ENCOUNTER — Other Ambulatory Visit: Payer: Self-pay

## 2020-11-23 ENCOUNTER — Ambulatory Visit (INDEPENDENT_AMBULATORY_CARE_PROVIDER_SITE_OTHER): Payer: PPO | Admitting: Family Medicine

## 2020-11-23 VITALS — BP 134/77 | HR 103 | Temp 98.4°F | Ht 66.0 in | Wt 156.5 lb

## 2020-11-23 DIAGNOSIS — R42 Dizziness and giddiness: Secondary | ICD-10-CM

## 2020-11-23 DIAGNOSIS — Z87898 Personal history of other specified conditions: Secondary | ICD-10-CM | POA: Diagnosis not present

## 2020-11-23 DIAGNOSIS — I25119 Atherosclerotic heart disease of native coronary artery with unspecified angina pectoris: Secondary | ICD-10-CM

## 2020-11-23 DIAGNOSIS — Z7689 Persons encountering health services in other specified circumstances: Secondary | ICD-10-CM

## 2020-11-23 DIAGNOSIS — E860 Dehydration: Secondary | ICD-10-CM | POA: Diagnosis not present

## 2020-11-23 DIAGNOSIS — R296 Repeated falls: Secondary | ICD-10-CM | POA: Diagnosis not present

## 2020-11-23 DIAGNOSIS — Z09 Encounter for follow-up examination after completed treatment for conditions other than malignant neoplasm: Secondary | ICD-10-CM

## 2020-11-23 NOTE — Progress Notes (Signed)
Established Patient Office Visit  Subjective:  Patient ID: [name], female    DOB: 1951-06-05  Age: 70 y.o. MRN: [id number]  CC:  Chief Complaint  Patient presents with  . Transitions Of Care    HPI [name] presents for TOC.  Today's visit was for Transitional Care Management.  The patient was discharged from [facility] on 11/16/20 with a primary diagnosis of dehydration.   Contact with the patient and/or caregiver, by a clinical staff member, was made on 11/16/20 and was documented as a telephone encounter within the EMR.  [name] went to the ED on 11/15/20 for feeling dizzy and lightheaded. She had also fallen. She was found to be hypotensive and dehydrated. She was admitted for observation and given IV fluids. Her Imdur was decreased to 60 mg. Spironolactone and lisinopril were also discontinued. She reports that she has dizziness that comes and goes since having Covid. Her symptoms have improved since her last ED visit but she still has dizziness at times. She was told to drink a small bottle of water every hour but she has not done this. She has taken Meclizine without improvement. She denies LOC or falls since. Denies changes in gait, balance, chest pain, shortness of breath, vision changes, edema, nausea, vomiting, or headache.   Through chart review and discussion with the patient I have determined that management of their condition is of moderate complexity.   She would like to establish with a cardiologist in [place]. She had to cancel her appointment with [facility] to establish care due to her hospitalization.    Past Medical History:  Diagnosis Date  . Anxiety   . CAD (coronary artery disease)    DES to circumflex 02/2007, BMS to LAD and PTCA diagonal 03/2007  . Carotid artery plaque    Mild  . Cataract   . Depression   . Diverticulitis, colon   . Elevated d-dimer 01/08/2014  . Essential hypertension, benign   . GERD (gastroesophageal reflux disease)   . H/O  hiatal hernia   . HLD (hyperlipidemia)   . IDDM (insulin dependent diabetes mellitus)   . Migraine    "used to have them really bad; don't have them anymore" (01/07/2014)  . MS (multiple sclerosis) ([place])    Not confirmed  . PAT (paroxysmal atrial tachycardia) ([place])   . Prolapse of uterus   . PVD (peripheral vascular disease) ([place])   . TIA (transient ischemic attack) 1980's    Past Surgical History:  Procedure Laterality Date  . ABDOMINAL HYSTERECTOMY  1986   ovaries remain - prolaspe uterus   . APPENDECTOMY  ~ 1970  . BREAST BIOPSY Right 1980's  . BREAST LUMPECTOMY Right 1980's   Dr. Charlynne Pander   . CARDIAC CATHETERIZATION  01/07/2014  . CHOLECYSTECTOMY  ?1987  . COLONOSCOPY  2002   Dr. Anwar--> Severe diverticular changes in the region of the sigmoid and descending colon with scattered diverticular changes throughout the rest of the colon. No polyps, ulcerations. Despite numerous manipulations, the tip of the scope could not be tipped into the cecal area.  . COLONOSCOPY  01/10/2012   Procedure: COLONOSCOPY;  Surgeon: Daneil Dolin, MD;  Location[facility];  Service: Endoscopy;  Laterality: N/A;  1:55  . CORONARY ANGIOPLASTY WITH STENT PLACEMENT  ~ 1997 X 2   "2 + 1"  . EYE SURGERY Bilateral 2014   cataract  . INTRAVASCULAR PRESSURE WIRE/FFR STUDY N/A 03/08/2017   Procedure: Intravascular Pressure Wire/FFR Study;  Surgeon: Nelva Bush, MD;  Location: [facility];  Service: Cardiovascular;  Laterality: N/A;  . LEFT HEART CATH AND CORONARY ANGIOGRAPHY N/A 03/08/2017   Procedure: Left Heart Cath and Coronary Angiography;  Surgeon: Nelva Bush, MD;  Location: [facility];  Service: Cardiovascular;  Laterality: N/A;  . LEFT HEART CATHETERIZATION WITH CORONARY ANGIOGRAM N/A 01/07/2014   Procedure: LEFT HEART CATHETERIZATION WITH CORONARY ANGIOGRAM;  Surgeon: Larey Dresser, MD;  Location: [facility];  Service: Cardiovascular;  Laterality: N/A;    Family History   Problem Relation Age of Onset  . Heart attack Mother 64  . Diabetes Mother   . Hypertension Mother   . Heart attack Father 52  . Heart attack Brother 32       x 6  . Heart disease Brother   . Diabetes Brother   . Colon cancer Paternal Aunt        79s, died with brain anuerysm  . Crohn's disease Cousin        paternal  . Diabetes Sister   . GER disease Daughter   . Cervical cancer Daughter   . Diabetes Daughter     Social History   Socioeconomic History  . Marital status: Widowed    Spouse name: Not on file  . Number of children: 4  . Years of education: 12  . Highest education level: 11th grade  Occupational History  . Occupation: Disability    Employer: DISABLED  Tobacco Use  . Smoking status: Never Smoker  . Smokeless tobacco: Never Used  . Tobacco comment: spouse, 59 years - husband has quit 01/2011  Vaping Use  . Vaping Use: Never used  Substance and Sexual Activity  . Alcohol use: No  . Drug use: No  . Sexual activity: Not Currently  Other Topics Concern  . Not on file  Social History Narrative  . Not on file   Social Determinants of Health   Financial Resource Strain: Not on file  Food Insecurity: Not on file  Transportation Needs: Not on file  Physical Activity: Not on file  Stress: Not on file  Social Connections: Not on file  Intimate Partner Violence: Not on file    Outpatient Medications Prior to Visit  Medication Sig Dispense Refill  . amLODipine (NORVASC) 10 MG tablet Take 1 tablet (10 mg total) by mouth daily. 30 tablet 5  . aspirin 325 MG tablet Take 325 mg by mouth daily.    Marland Kitchen atenolol (TENORMIN) 50 MG tablet Take 1 tablet (50 mg total) by mouth 2 (two) times daily. 180 tablet 3  . calcium carbonate (OS-CAL) 600 MG TABS tablet Take 1 tablet (600 mg total) by mouth 2 (two) times daily with a meal. 180 tablet 3  . citalopram (CELEXA) 40 MG tablet TAKE ONE (1) TABLET EACH DAY (Patient taking differently: Take 40 mg by mouth daily.) 90  tablet 0  . empagliflozin (JARDIANCE) 10 MG TABS tablet Take 10 mg by mouth daily. 30 tablet 3  . esomeprazole (NEXIUM) 40 MG capsule Take 1 capsule (40 mg total) by mouth daily. 30 capsule 3  . isosorbide mononitrate (IMDUR) 120 MG 24 hr tablet TAKE ONE (1) TABLET EACH DAY (Patient taking differently: Take 120 mg by mouth daily. TAKE ONE (1) TABLET EACH DAY) 90 tablet 1  . isosorbide mononitrate (IMDUR) 60 MG 24 hr tablet Take 1 tablet (60 mg total) by mouth daily. 30 tablet 0  . meclizine (ANTIVERT) 25 MG tablet Take 1  tablet (25 mg total) by mouth 3 (three) times daily as needed for dizziness. 90 tablet 3  . nitroGLYCERIN (NITROSTAT) 0.4 MG SL tablet DISSOLVE 1 TABLET UNDER TONGUE FOR CHESTPAIN.MAY REPEAT EVERY 5 MINUTES FOR 3 DOSES.IF NO RELIEF CALL 911 OR GO TO ER 25 tablet 6  . NOVOLOG FLEXPEN 100 UNIT/ML FlexPen INJECT 35-40 UNITS SQ 3 TIMES DAILY WITH MEALS (Patient taking differently: 8-30 Units. Takes 8-30 units TID with meals depending on glucose) 30 mL 3  . ondansetron (ZOFRAN) 8 MG tablet Take 0.5 tablets (4 mg total) by mouth every 6 (six) hours as needed. 60 tablet 0  . rOPINIRole (REQUIP) 0.5 MG tablet Take 1 tablet (0.5 mg total) by mouth at bedtime. 90 tablet 3  . rosuvastatin (CRESTOR) 40 MG tablet TAKE ONE (1) TABLET EACH DAY (Patient taking differently: Take 40 mg by mouth daily.) 90 tablet 3  . Semaglutide,0.25 or 0.5MG/DOS, (OZEMPIC, 0.25 OR 0.5 MG/DOSE,) 2 MG/1.5ML SOPN Inject 0.375 mLs (0.5 mg total) into the skin once a week. 1.5 mL 11  . TOUJEO SOLOSTAR 300 UNIT/ML SOPN INJECT 50-60 UNITS SQ DAILY (Patient taking differently: Inject 50 Units into the skin daily.) 9 mL 2  . vitamin B-12 (CYANOCOBALAMIN) 1000 MCG tablet Take 1,000 mcg by mouth daily.    . Vitamin D, Ergocalciferol, (DRISDOL) 1.25 MG (50000 UNIT) CAPS capsule TAKE 1 CAPSULE EVERY 7 DAYS 12 capsule 3  . clonazePAM (KLONOPIN) 0.5 MG tablet Take 1 tablet (0.5 mg total) by mouth at bedtime as needed for anxiety. 30  tablet 2  . fluconazole (DIFLUCAN) 150 MG tablet Take 1 tablet now. May take another tablet after finishing antibiotic. 2 tablet 0  . FLUZONE HIGH-DOSE QUADRIVALENT 0.7 ML SUSY      No facility-administered medications prior to visit.    Allergies  Allergen Reactions  . Iohexol      Desc: pt had syncopal episode with nausea post IV CM late 1990's,  pt has had prednisone prep with heart caths x 2 without problem  kdean 04/16/07, Onset Date: [id number]   . Ticlid [Ticlopidine Hcl] Nausea And Vomiting  . Codeine Nausea And Vomiting and Palpitations    ROS Review of Systems  As per HPI.    Objective:    Physical Exam Vitals and nursing note reviewed.  Constitutional:      General: She is not in acute distress.    Appearance: Normal appearance. She is not ill-appearing or toxic-appearing.  HENT:     Head: Normocephalic and atraumatic.  Eyes:     Extraocular Movements: Extraocular movements intact.     Conjunctiva/sclera: Conjunctivae normal.     Pupils: Pupils are equal, round, and reactive to light.  Cardiovascular:     Rate and Rhythm: Normal rate and regular rhythm.     Heart sounds: Normal heart sounds. No murmur heard.   Pulmonary:     Effort: Pulmonary effort is normal. No respiratory distress.     Breath sounds: Normal breath sounds.  Abdominal:     General: Bowel sounds are normal. There is no distension.     Palpations: Abdomen is soft.     Tenderness: There is no abdominal tenderness.  Skin:    General: Skin is warm and dry.  Neurological:     General: No focal deficit present.     Mental Status: She is alert and oriented to person, place, and time.     Motor: Weakness (generalized) present.     Gait: Gait normal.  Psychiatric:  Mood and Affect: Mood normal.        Behavior: Behavior normal.        Thought Content: Thought content normal.        Judgment: Judgment normal.     BP 134/77   Pulse (!) 103   Temp 98.4 F (36.9 C) (Temporal)   Ht 5'  6" (1.676 m)   Wt 156 lb 8 oz (71 kg)   BMI 25.26 kg/m  Wt Readings from Last 3 Encounters:  11/23/20 156 lb 8 oz (71 kg)  11/15/20 156 lb (70.8 kg)  11/02/20 159 lb 3.2 oz (72.2 kg)     Health Maintenance Due  Topic Date Due  . COVID-19 Vaccine (1) Never done  . FOOT EXAM  09/27/2020  . OPHTHALMOLOGY EXAM  09/27/2020    There are no preventive care reminders to display for this patient.  Lab Results  Component Value Date   TSH 3.372 11/15/2020   Lab Results  Component Value Date   WBC 6.8 11/16/2020   HGB 10.4 (L) 11/16/2020   HCT 32.6 (L) 11/16/2020   MCV 90.1 11/16/2020   PLT 186 11/16/2020   Lab Results  Component Value Date   NA 136 11/16/2020   K 5.0 11/16/2020   CO2 24 11/16/2020   GLUCOSE 154 (H) 11/16/2020   BUN 25 (H) 11/16/2020   CREATININE 1.43 (H) 11/16/2020   BILITOT 0.4 11/16/2020   ALKPHOS 42 11/16/2020   AST 17 11/16/2020   ALT 14 11/16/2020   PROT 6.5 11/16/2020   ALBUMIN 3.4 (L) 11/16/2020   CALCIUM 9.3 11/16/2020   ANIONGAP 8 11/16/2020   Lab Results  Component Value Date   CHOL 128 03/02/2020   Lab Results  Component Value Date   HDL 34 (L) 03/02/2020   Lab Results  Component Value Date   LDLCALC 68 03/02/2020   Lab Results  Component Value Date   TRIG 146 03/02/2020   Lab Results  Component Value Date   CHOLHDL 3.8 03/02/2020   Lab Results  Component Value Date   HGBA1C 8.0 (H) 10/10/2020      Assessment & Plan:   Hetty was seen today for transitions of care.  Diagnoses and all orders for this visit:  Dehydration Stressed importance of oral hydration. Labs pending as below. BP normal today.  -     CBC with Differential/Platelet -     CMP14+EGFR  Dizziness Improving. Labs pending as below. Referral to PT placed.  -     CBC with Differential/Platelet -     CMP14+EGFR -     Ambulatory referral to Physical Therapy  Frequent falls Referral to PT placed.  No falls this week.  -     Ambulatory referral to  Physical Therapy  Coronary artery disease involving native coronary artery of native heart with angina pectoris (HCC)/H/O prolonged Q-T interval on ECG  New referral to cardiology in [place] placed.  -     Ambulatory referral to Cardiology   Encounter for support and coordination of transition of care/Hospital discharge follow up Reviewed [facility] ED records for 11/16/20   Follow-up: Return in about 1 week (around 11/30/2020) for chronic follow up. Sooner for new or worsening symptoms.   The patient indicates understanding of these issues and agrees with the plan.  Gwenlyn Perking, FNP

## 2020-11-23 NOTE — Patient Instructions (Signed)
Dehydration, Adult Dehydration is a condition in which there is not enough water or other fluids in the body. This happens when a person loses more fluids than he or she takes in. Important organs, such as the kidneys, brain, and heart, cannot function without a proper amount of fluids. Any loss of fluids from the body can lead to dehydration. Dehydration can be mild, moderate, or severe. It should be treated right away to prevent it from becoming severe. What are the causes? Dehydration may be caused by:  Conditions that cause loss of water or other fluids, such as diarrhea, vomiting, or sweating or urinating a lot.  Not drinking enough fluids, especially when you are ill or doing activities that require a lot of energy.  Other illnesses and conditions, such as fever or infection.  Certain medicines, such as medicines that remove excess fluid from the body (diuretics).  Lack of safe drinking water.  Not being able to get enough water and food. What increases the risk? The following factors may make you more likely to develop this condition:  Having a long-term (chronic) illness that has not been treated properly, such as diabetes, heart disease, or kidney disease.  Being 65 years of age or older.  Having a disability.  Living in a place that is high in altitude, where thinner, drier air causes more fluid loss.  Doing exercises that put stress on your body for a long time (endurance sports). What are the signs or symptoms? Symptoms of dehydration depend on how severe it is. Mild or moderate dehydration  Thirst.  Dry lips or dry mouth.  Dizziness or light-headedness, especially when standing up from a seated position.  Muscle cramps.  Dark urine. Urine may be the color of tea.  Less urine or tears produced than usual.  Headache. Severe dehydration  Changes in skin. Your skin may be cold and clammy, blotchy, or pale. Your skin also may not return to normal after being  lightly pinched and released.  Little or no tears, urine, or sweat.  Changes in vital signs, such as rapid breathing and low blood pressure. Your pulse may be weak or may be faster than 100 beats a minute when you are sitting still.  Other changes, such as: ? Feeling very thirsty. ? Sunken eyes. ? Cold hands and feet. ? Confusion. ? Being very tired (lethargic) or having trouble waking from sleep. ? Short-term weight loss. ? Loss of consciousness. How is this diagnosed? This condition is diagnosed based on your symptoms and a physical exam. You may have blood and urine tests to help confirm the diagnosis. How is this treated? Treatment for this condition depends on how severe it is. Treatment should be started right away. Do not wait until dehydration becomes severe. Severe dehydration is an emergency and needs to be treated in a hospital.  Mild or moderate dehydration can be treated at home. You may be asked to: ? Drink more fluids. ? Drink an oral rehydration solution (ORS). This drink helps restore proper amounts of fluids and salts and minerals in the blood (electrolytes).  Severe dehydration can be treated: ? With IV fluids. ? By correcting abnormal levels of electrolytes. This is often done by giving electrolytes through a tube that is passed through your nose and into your stomach (nasogastric tube, or NG tube). ? By treating the underlying cause of dehydration. Follow these instructions at home: Oral rehydration solution If told by your health care provider, drink an ORS:  Make   an ORS by following instructions on the package.  Start by drinking small amounts, about  cup (120 mL) every 5-10 minutes.  Slowly increase how much you drink until you have taken the amount recommended by your health care provider. Eating and drinking  Drink enough clear fluid to keep your urine pale yellow. If you were told to drink an ORS, finish the ORS first and then start slowly drinking  other clear fluids. Drink fluids such as: ? Water. Do not drink only water. Doing that can lead to hyponatremia, which is having too little salt (sodium) in the body. ? Water from ice chips you suck on. ? Fruit juice that you have added water to (diluted fruit juice). ? Low-calorie sports drinks.  Eat foods that contain a healthy balance of electrolytes, such as bananas, oranges, potatoes, tomatoes, and spinach.  Do not drink alcohol.  Avoid the following: ? Drinks that contain a lot of sugar. These include high-calorie sports drinks, fruit juice that is not diluted, and soda. ? Caffeine. ? Foods that are greasy or contain a lot of fat or sugar.         General instructions  Take over-the-counter and prescription medicines only as told by your health care provider.  Do not take sodium tablets. Doing that can lead to having too much sodium in the body (hypernatremia).  Return to your normal activities as told by your health care provider. Ask your health care provider what activities are safe for you.  Keep all follow-up visits as told by your health care provider. This is important. Contact a health care provider if:  You have muscle cramps, pain, or discomfort, such as: ? Pain in your abdomen and the pain gets worse or stays in one area (localizes). ? Stiff neck.  You have a rash.  You are more irritable than usual.  You are sleepier or have a harder time waking than usual.  You feel weak or dizzy.  You feel very thirsty. Get help right away if you have:  Any symptoms of severe dehydration.  Symptoms of vomiting, such as: ? You cannot eat or drink without vomiting. ? Vomiting gets worse or does not go away. ? Vomit includes blood or green matter (bile).  Symptoms that get worse with treatment.  A fever.  A severe headache.  Problems with urination or bowel movements, such as: ? Diarrhea that gets worse or does not go away. ? Blood in your stool (feces).  This may cause stool to look black and tarry. ? Not urinating, or urinating only a small amount of very dark urine, within 6-8 hours.  Trouble breathing. These symptoms may represent a serious problem that is an emergency. Do not wait to see if the symptoms will go away. Get medical help right away. Call your local emergency services (911 in the [place] Do not drive yourself to the hospital. Summary  Dehydration is a condition in which there is not enough water or other fluids in the body. This happens when a person loses more fluids than he or she takes in.  Treatment for this condition depends on how severe it is. Treatment should be started right away. Do not wait until dehydration becomes severe.  Drink enough clear fluid to keep your urine pale yellow. If you were told to drink an oral rehydration solution (ORS), finish the ORS first and then start slowly drinking other clear fluids.  Take over-the-counter and prescription medicines only as told by your health   care provider.  Get help right away if you have any symptoms of severe dehydration. This information is not intended to replace advice given to you by your health care provider. Make sure you discuss any questions you have with your health care provider. Document Revised: 04/23/2019 Document Reviewed: 04/23/2019 Elsevier Patient Education  2021 [employer]  

## 2020-11-24 LAB — CBC WITH DIFFERENTIAL/PLATELET
Basophils Absolute: 0 10*3/uL (ref 0.0–0.2)
Basos: 0 %
EOS (ABSOLUTE): 0.1 10*3/uL (ref 0.0–0.4)
Eos: 2 %
Hematocrit: 33.8 % — ABNORMAL LOW (ref 34.0–46.6)
Hemoglobin: 11.3 g/dL (ref 11.1–15.9)
Immature Grans (Abs): 0 10*3/uL (ref 0.0–0.1)
Immature Granulocytes: 0 %
Lymphocytes Absolute: 2.6 10*3/uL (ref 0.7–3.1)
Lymphs: 39 %
MCH: 29.3 pg (ref 26.6–33.0)
MCHC: 33.4 g/dL (ref 31.5–35.7)
MCV: 88 fL (ref 79–97)
Monocytes Absolute: 0.4 10*3/uL (ref 0.1–0.9)
Monocytes: 6 %
Neutrophils Absolute: 3.5 10*3/uL (ref 1.4–7.0)
Neutrophils: 53 %
Platelets: 206 10*3/uL (ref 150–450)
RBC: 3.86 x10E6/uL (ref 3.77–5.28)
RDW: 14.7 % (ref 11.7–15.4)
WBC: 6.7 10*3/uL (ref 3.4–10.8)

## 2020-11-24 LAB — CMP14+EGFR
ALT: 10 IU/L (ref 0–32)
AST: 12 IU/L (ref 0–40)
Albumin/Globulin Ratio: 1.4 (ref 1.2–2.2)
Albumin: 4.3 g/dL (ref 3.7–4.7)
Alkaline Phosphatase: 65 IU/L (ref 44–121)
BUN/Creatinine Ratio: 12 (ref 12–28)
BUN: 15 mg/dL (ref 8–27)
Bilirubin Total: <0.2 mg/dL (ref 0.0–1.2)
CO2: 22 mmol/L (ref 20–29)
Calcium: 10 mg/dL (ref 8.7–10.3)
Chloride: 99 mmol/L (ref 96–106)
Creatinine, Ser: 1.23 mg/dL — ABNORMAL HIGH (ref 0.57–1.00)
Globulin, Total: 3.1 g/dL (ref 1.5–4.5)
Glucose: 346 mg/dL — ABNORMAL HIGH (ref 65–99)
Potassium: 4.6 mmol/L (ref 3.5–5.2)
Sodium: 137 mmol/L (ref 134–144)
Total Protein: 7.4 g/dL (ref 6.0–8.5)
eGFR: 47 mL/min/{1.73_m2} — ABNORMAL LOW (ref >59)

## 2020-11-25 ENCOUNTER — Other Ambulatory Visit: Payer: Self-pay | Admitting: Family Medicine

## 2020-11-25 ENCOUNTER — Telehealth: Payer: Self-pay | Admitting: Family Medicine

## 2020-11-25 DIAGNOSIS — F419 Anxiety disorder, unspecified: Secondary | ICD-10-CM

## 2020-11-25 DIAGNOSIS — F339 Major depressive disorder, recurrent, unspecified: Secondary | ICD-10-CM

## 2020-11-25 MED ORDER — SERTRALINE HCL 50 MG PO TABS: 50.0000 mg | ORAL_TABLET | Freq: Every day | ORAL | 3 refills | Status: DC

## 2020-11-25 NOTE — Telephone Encounter (Signed)
[name] from [facility] called to speak with me. [name]'s PhQ 9 score was 10 today and her GAD 7 score was 15. She has been taking Celexa 40 mg daily for some time. She is interested in switching to another daily antidepressant to see if this will control her symptoms better. Will switch her from Celexa 40 mg to Zoloft 50 mg daily. She has a follow up appointment with me scheduled for 11/30/20.

## 2020-11-28 DIAGNOSIS — R739 Hyperglycemia, unspecified: Secondary | ICD-10-CM | POA: Diagnosis not present

## 2020-11-28 DIAGNOSIS — N390 Urinary tract infection, site not specified: Secondary | ICD-10-CM | POA: Diagnosis not present

## 2020-11-29 ENCOUNTER — Telehealth: Payer: Self-pay

## 2020-11-29 DIAGNOSIS — I25119 Atherosclerotic heart disease of native coronary artery with unspecified angina pectoris: Secondary | ICD-10-CM

## 2020-11-29 DIAGNOSIS — Z87898 Personal history of other specified conditions: Secondary | ICD-10-CM

## 2020-11-30 ENCOUNTER — Encounter: Payer: Self-pay | Admitting: Family Medicine

## 2020-11-30 ENCOUNTER — Other Ambulatory Visit: Payer: Self-pay

## 2020-11-30 ENCOUNTER — Ambulatory Visit (INDEPENDENT_AMBULATORY_CARE_PROVIDER_SITE_OTHER): Payer: PPO | Admitting: Family Medicine

## 2020-11-30 VITALS — BP 140/69 | HR 116 | Temp 98.3°F | Ht 66.0 in | Wt 156.0 lb

## 2020-11-30 DIAGNOSIS — F339 Major depressive disorder, recurrent, unspecified: Secondary | ICD-10-CM

## 2020-11-30 DIAGNOSIS — I739 Peripheral vascular disease, unspecified: Secondary | ICD-10-CM | POA: Diagnosis not present

## 2020-11-30 DIAGNOSIS — F411 Generalized anxiety disorder: Secondary | ICD-10-CM

## 2020-11-30 DIAGNOSIS — I509 Heart failure, unspecified: Secondary | ICD-10-CM | POA: Insufficient documentation

## 2020-11-30 DIAGNOSIS — I10 Essential (primary) hypertension: Secondary | ICD-10-CM

## 2020-11-30 DIAGNOSIS — E1169 Type 2 diabetes mellitus with other specified complication: Secondary | ICD-10-CM | POA: Diagnosis not present

## 2020-11-30 DIAGNOSIS — I25119 Atherosclerotic heart disease of native coronary artery with unspecified angina pectoris: Secondary | ICD-10-CM

## 2020-11-30 DIAGNOSIS — K219 Gastro-esophageal reflux disease without esophagitis: Secondary | ICD-10-CM

## 2020-11-30 DIAGNOSIS — E785 Hyperlipidemia, unspecified: Secondary | ICD-10-CM | POA: Diagnosis not present

## 2020-11-30 DIAGNOSIS — N3000 Acute cystitis without hematuria: Secondary | ICD-10-CM

## 2020-11-30 DIAGNOSIS — E538 Deficiency of other specified B group vitamins: Secondary | ICD-10-CM | POA: Diagnosis not present

## 2020-11-30 LAB — BAYER DCA HB A1C WAIVED: HB A1C (BAYER DCA - WAIVED): 9.3 % — ABNORMAL HIGH (ref <7.0)

## 2020-11-30 MED ORDER — TOUJEO SOLOSTAR 300 UNIT/ML ~~LOC~~ SOPN
53.0000 [IU] | PEN_INJECTOR | Freq: Every day | SUBCUTANEOUS | 2 refills | Status: DC
Start: 2020-11-30 — End: 2021-08-09

## 2020-11-30 NOTE — Progress Notes (Signed)
Established Patient Office Visit  Subjective:  Patient ID: [name], female    DOB: May 10, 1951  Age: 70 y.o. MRN: [id number]  CC:  Chief Complaint  Patient presents with  . Medical Management of Chronic Issues    HPI [name] presents for chronic follow up.  1. UTI [facility] gave her a Rocephin injection on Monday for another UTI. [facility] also started her on Keflex.   2. DM Patient denies foot ulcerations, increased appetite, paresthesia of the feet, visual disturbances, vomiting and weight loss.  Current diabetic medications include jardiance, novolog, tougeo, and Ozempic Compliant with meds - Yes  Current monitoring regimen: home blood tests - 3-4 times daily Home blood sugar records: fasting range: 180-200, a little higher before bed. Around 200 after eating.  Any episodes of hypoglycemia? Had one yesterday Urine microalbumin UTD? Yes  3. HTN Complaint with meds - Yes Current Medications - atenolol, imdur Checking BP at home ranging: did have at least one low reading since last week Pertinent ROS:  Headache - No Fatigue - at baseline Visual Disturbances - No Chest pain - No Dyspnea - at baseline Palpitations - No LE edema - No They report good compliance with medications and can restate their regimen by memory. No medication side effects. Reports that dizziness has been improving. She was unable to go to PT due to cost.  Family, social, and smoking history reviewed.   BP Readings from Last 3 Encounters:  11/30/20 140/69  11/23/20 134/77  11/16/20 130/68    4. Depression/anxiety Swtiched from Celexa to Zoloft last week. She reports feeling much better and that she is sleeping better as well. Denies SI.   Past Medical History:  Diagnosis Date  . Anxiety   . CAD (coronary artery disease)    DES to circumflex 02/2007, BMS to LAD and PTCA diagonal 03/2007  . Carotid artery plaque    Mild  . Cataract   . Depression   . Diverticulitis, colon   .  Elevated d-dimer 01/08/2014  . Essential hypertension, benign   . GERD (gastroesophageal reflux disease)   . H/O hiatal hernia   . HLD (hyperlipidemia)   . IDDM (insulin dependent diabetes mellitus)   . Migraine    "used to have them really bad; don't have them anymore" (01/07/2014)  . MS (multiple sclerosis) ([place])    Not confirmed  . PAT (paroxysmal atrial tachycardia) ([place])   . Prolapse of uterus   . PVD (peripheral vascular disease) ([place])   . TIA (transient ischemic attack) 1980's    Past Surgical History:  Procedure Laterality Date  . ABDOMINAL HYSTERECTOMY  1986   ovaries remain - prolaspe uterus   . APPENDECTOMY  ~ 1970  . BREAST BIOPSY Right 1980's  . BREAST LUMPECTOMY Right 1980's   Dr. Charlynne Pander   . CARDIAC CATHETERIZATION  01/07/2014  . CHOLECYSTECTOMY  ?1987  . COLONOSCOPY  2002   Dr. Anwar--> Severe diverticular changes in the region of the sigmoid and descending colon with scattered diverticular changes throughout the rest of the colon. No polyps, ulcerations. Despite numerous manipulations, the tip of the scope could not be tipped into the cecal area.  . COLONOSCOPY  01/10/2012   Procedure: COLONOSCOPY;  Surgeon: Daneil Dolin, MD;  [facility];  Service: Endoscopy;  Laterality: N/A;  1:55  . CORONARY ANGIOPLASTY WITH STENT PLACEMENT  ~ 1997 X 2   "2 + 1"  . EYE SURGERY Bilateral 2014   cataract  .  INTRAVASCULAR PRESSURE WIRE/FFR STUDY N/A 03/08/2017   Procedure: Intravascular Pressure Wire/FFR Study;  Surgeon: Nelva Bush, MD;  Location: [facility];  Service: Cardiovascular;  Laterality: N/A;  . LEFT HEART CATH AND CORONARY ANGIOGRAPHY N/A 03/08/2017   Procedure: Left Heart Cath and Coronary Angiography;  Surgeon: Nelva Bush, MD;  Location: [facility];  Service: Cardiovascular;  Laterality: N/A;  . LEFT HEART CATHETERIZATION WITH CORONARY ANGIOGRAM N/A 01/07/2014   Procedure: LEFT HEART CATHETERIZATION WITH CORONARY ANGIOGRAM;   Surgeon: Larey Dresser, MD;  Location: [facility]ATH LAB;  Service: Cardiovascular;  Laterality: N/A;    Family History  Problem Relation Age of Onset  . Heart attack Mother 60  . Diabetes Mother   . Hypertension Mother   . Heart attack Father 46  . Heart attack Brother 32       x 6  . Heart disease Brother   . Diabetes Brother   . Colon cancer Paternal Aunt        10s, died with brain anuerysm  . Crohn's disease Cousin        paternal  . Diabetes Sister   . GER disease Daughter   . Cervical cancer Daughter   . Diabetes Daughter     Social History   Socioeconomic History  . Marital status: Widowed    Spouse name: Not on file  . Number of children: 4  . Years of education: 35  . Highest education level: 11th grade  Occupational History  . Occupation: Disability    Employer: DISABLED  Tobacco Use  . Smoking status: Never Smoker  . Smokeless tobacco: Never Used  . Tobacco comment: spouse, 76 years - husband has quit 01/2011  Vaping Use  . Vaping Use: Never used  Substance and Sexual Activity  . Alcohol use: No  . Drug use: No  . Sexual activity: Not Currently  Other Topics Concern  . Not on file  Social History Narrative  . Not on file   Social Determinants of Health   Financial Resource Strain: Not on file  Food Insecurity: Not on file  Transportation Needs: Not on file  Physical Activity: Not on file  Stress: Not on file  Social Connections: Not on file  Intimate Partner Violence: Not on file    Outpatient Medications Prior to Visit  Medication Sig Dispense Refill  . amLODipine (NORVASC) 10 MG tablet Take 1 tablet (10 mg total) by mouth daily. 30 tablet 5  . aspirin 325 MG tablet Take 325 mg by mouth daily.    Marland Kitchen atenolol (TENORMIN) 50 MG tablet Take 1 tablet (50 mg total) by mouth 2 (two) times daily. 180 tablet 3  . calcium carbonate (OS-CAL) 600 MG TABS tablet Take 1 tablet (600 mg total) by mouth 2 (two) times daily with a meal. 180 tablet 3  .  cephALEXin (KEFLEX) 500 MG capsule Take 500 mg by mouth at bedtime.    . empagliflozin (JARDIANCE) 10 MG TABS tablet Take 10 mg by mouth daily. 30 tablet 3  . esomeprazole (NEXIUM) 40 MG capsule Take 1 capsule (40 mg total) by mouth daily. 30 capsule 3  . isosorbide mononitrate (IMDUR) 60 MG 24 hr tablet Take 1 tablet (60 mg total) by mouth daily. 30 tablet 0  . meclizine (ANTIVERT) 25 MG tablet Take 1 tablet (25 mg total) by mouth 3 (three) times daily as needed for dizziness. 90 tablet 3  . nitroGLYCERIN (NITROSTAT) 0.4 MG SL tablet DISSOLVE 1 TABLET UNDER  TONGUE FOR CHESTPAIN.MAY REPEAT EVERY 5 MINUTES FOR 3 DOSES.IF NO RELIEF CALL 911 OR GO TO ER 25 tablet 6  . NOVOLOG FLEXPEN 100 UNIT/ML FlexPen INJECT 35-40 UNITS SQ 3 TIMES DAILY WITH MEALS (Patient taking differently: 8-30 Units. Takes 8-30 units TID with meals depending on glucose) 30 mL 3  . ondansetron (ZOFRAN) 8 MG tablet Take 0.5 tablets (4 mg total) by mouth every 6 (six) hours as needed. 60 tablet 0  . rOPINIRole (REQUIP) 0.5 MG tablet Take 1 tablet (0.5 mg total) by mouth at bedtime. 90 tablet 3  . rosuvastatin (CRESTOR) 40 MG tablet TAKE ONE (1) TABLET EACH DAY (Patient taking differently: Take 40 mg by mouth daily.) 90 tablet 3  . Semaglutide,0.25 or 0.5MG/DOS, (OZEMPIC, 0.25 OR 0.5 MG/DOSE,) 2 MG/1.5ML SOPN Inject 0.375 mLs (0.5 mg total) into the skin once a week. 1.5 mL 11  . sertraline (ZOLOFT) 50 MG tablet Take 1 tablet (50 mg total) by mouth daily. 30 tablet 3  . TOUJEO SOLOSTAR 300 UNIT/ML SOPN INJECT 50-60 UNITS SQ DAILY (Patient taking differently: Inject 50 Units into the skin daily.) 9 mL 2  . vitamin B-12 (CYANOCOBALAMIN) 1000 MCG tablet Take 1,000 mcg by mouth daily.    . Vitamin D, Ergocalciferol, (DRISDOL) 1.25 MG (50000 UNIT) CAPS capsule TAKE 1 CAPSULE EVERY 7 DAYS 12 capsule 3  . isosorbide mononitrate (IMDUR) 120 MG 24 hr tablet TAKE ONE (1) TABLET EACH DAY (Patient not taking: Reported on 11/30/2020) 90 tablet 1    No facility-administered medications prior to visit.    Allergies  Allergen Reactions  . Iohexol      Desc: pt had syncopal episode with nausea post IV CM late 1990's,  pt has had prednisone prep with heart caths x 2 without problem  kdean 04/16/07, Onset Date: [id number]   . Ticlid [Ticlopidine Hcl] Nausea And Vomiting  . Codeine Nausea And Vomiting and Palpitations    ROS Review of Systems    Objective:    Physical Exam Vitals and nursing note reviewed.  Constitutional:      General: She is not in acute distress.    Appearance: Normal appearance. She is not ill-appearing or toxic-appearing.  HENT:     Head: Normocephalic and atraumatic.  Eyes:     Extraocular Movements: Extraocular movements intact.     Conjunctiva/sclera: Conjunctivae normal.     Pupils: Pupils are equal, round, and reactive to light.  Cardiovascular:     Rate and Rhythm: Normal rate and regular rhythm.     Heart sounds: Normal heart sounds. No murmur heard.   Pulmonary:     Effort: Pulmonary effort is normal. No respiratory distress.     Breath sounds: Normal breath sounds.  Abdominal:     General: Bowel sounds are normal. There is no distension.     Palpations: Abdomen is soft.     Tenderness: There is no abdominal tenderness.  Musculoskeletal:     Right lower leg: No edema.     Left lower leg: No edema.  Skin:    General: Skin is warm and dry.  Neurological:     General: No focal deficit present.     Mental Status: She is alert and oriented to person, place, and time.     Motor: Weakness (generalized) present.     Gait: Gait normal.  Psychiatric:        Mood and Affect: Mood normal.        Behavior: Behavior normal.  Thought Content: Thought content normal.        Judgment: Judgment normal.     Diabetic Foot Exam - Simple   Simple Foot Form Diabetic Foot exam was performed with the following findings: Yes 11/30/2020 11:30 AM  Visual Inspection No deformities, no ulcerations, no  other skin breakdown bilaterally: Yes Sensation Testing Intact to touch and monofilament testing bilaterally: Yes Pulse Check Posterior Tibialis and Dorsalis pulse intact bilaterally: Yes Comments     BP 140/69   Pulse (!) 116   Temp 98.3 F (36.8 C) (Temporal)   Ht 5' 6"  (1.676 m)   Wt 156 lb (70.8 kg)   BMI 25.18 kg/m  Wt Readings from Last 3 Encounters:  11/30/20 156 lb (70.8 kg)  11/23/20 156 lb 8 oz (71 kg)  11/15/20 156 lb (70.8 kg)     Health Maintenance Due  Topic Date Due  . COVID-19 Vaccine (3 - Booster for Moderna series) 06/19/2020  . FOOT EXAM  09/27/2020  . OPHTHALMOLOGY EXAM  09/27/2020    There are no preventive care reminders to display for this patient.  Lab Results  Component Value Date   TSH 3.372 11/15/2020   Lab Results  Component Value Date   WBC 6.7 11/23/2020   HGB 11.3 11/23/2020   HCT 33.8 (L) 11/23/2020   MCV 88 11/23/2020   PLT 206 11/23/2020   Lab Results  Component Value Date   NA 137 11/23/2020   K 4.6 11/23/2020   CO2 22 11/23/2020   GLUCOSE 346 (H) 11/23/2020   BUN 15 11/23/2020   CREATININE 1.23 (H) 11/23/2020   BILITOT <0.2 11/23/2020   ALKPHOS 65 11/23/2020   AST 12 11/23/2020   ALT 10 11/23/2020   PROT 7.4 11/23/2020   ALBUMIN 4.3 11/23/2020   CALCIUM 10.0 11/23/2020   ANIONGAP 8 11/16/2020   Lab Results  Component Value Date   CHOL 128 03/02/2020   Lab Results  Component Value Date   HDL 34 (L) 03/02/2020   Lab Results  Component Value Date   LDLCALC 68 03/02/2020   Lab Results  Component Value Date   TRIG 146 03/02/2020   Lab Results  Component Value Date   CHOLHDL 3.8 03/02/2020   Lab Results  Component Value Date   HGBA1C 8.0 (H) 10/10/2020      Assessment & Plan:   Desma was seen today for medical management of chronic issues.  Diagnoses and all orders for this visit:  DM type 2 with diabetic dyslipidemia ([place]) A1c at 9.3 today, goal is <8. Increase Toujeo to 53 units. Notify  provider if fasting blood sugar >150 for titration up. Labs pending as below.  -     [facility] DCA Hb A1c Waived -     Lipid panel -     CMP14+EGFR -     CBC with Differential/Platelet -     insulin glargine, 1 Unit Dial, (TOUJEO SOLOSTAR) 300 UNIT/ML Solostar Pen; Inject 53 Units into the skin daily.  Essential hypertension BP 140/69 today. No changes in medications today as she has been having hypotension recently. Labs pending. -     Lipid panel -     CMP14+EGFR -     CBC with Differential/Platelet  Congestive heart failure, unspecified HF chronicity, unspecified heart failure type [facility]) Referral to cardiology has been placed. Labs pending. -     Lipid panel -     CMP14+EGFR -     CBC with Differential/Platelet  Claudication [facility]) Referral  to cardiology has been placed. Labs pending.  -     Lipid panel -     CMP14+EGFR -     CBC with Differential/Platelet  Coronary artery disease involving native coronary artery of native heart with angina pectoris [facility]) Referral to cardiology has been placed. Labs pending.  -     Lipid panel -     CMP14+EGFR -     CBC with Differential/Platelet  Depression, recurrent (HCC) Generalized anxiety disorder Switched to zoloft last week. Reports improvement.  Gastroesophageal reflux disease without esophagitis Well controlled on current regimen.   Vitamin B12 deficiency On PO repletion. Previously had injections. Labs pending.  -     Vitamin B12  Acute cystitis without hematuria Currently on Keflex. [facility] will repeat UA.    Follow-up: Return in about 6 weeks (around 01/11/2021) for depression/anxiety. Sooner for new or worsening symptoms.   The patient indicates understanding of these issues and agrees with the plan.    Gwenlyn Perking, FNP

## 2020-12-01 LAB — CBC WITH DIFFERENTIAL/PLATELET
Basophils Absolute: 0 10*3/uL (ref 0.0–0.2)
Basos: 0 %
EOS (ABSOLUTE): 0.1 10*3/uL (ref 0.0–0.4)
Eos: 2 %
Hematocrit: 32.9 % — ABNORMAL LOW (ref 34.0–46.6)
Hemoglobin: 10.9 g/dL — ABNORMAL LOW (ref 11.1–15.9)
Immature Grans (Abs): 0 10*3/uL (ref 0.0–0.1)
Immature Granulocytes: 0 %
Lymphocytes Absolute: 2.7 10*3/uL (ref 0.7–3.1)
Lymphs: 39 %
MCH: 28.8 pg (ref 26.6–33.0)
MCHC: 33.1 g/dL (ref 31.5–35.7)
MCV: 87 fL (ref 79–97)
Monocytes Absolute: 0.4 10*3/uL (ref 0.1–0.9)
Monocytes: 5 %
Neutrophils Absolute: 3.8 10*3/uL (ref 1.4–7.0)
Neutrophils: 54 %
Platelets: 191 10*3/uL (ref 150–450)
RBC: 3.79 x10E6/uL (ref 3.77–5.28)
RDW: 14.6 % (ref 11.7–15.4)
WBC: 7 10*3/uL (ref 3.4–10.8)

## 2020-12-01 LAB — CMP14+EGFR
ALT: 11 IU/L (ref 0–32)
AST: 19 IU/L (ref 0–40)
Albumin/Globulin Ratio: 1.5 (ref 1.2–2.2)
Albumin: 4.1 g/dL (ref 3.7–4.7)
Alkaline Phosphatase: 59 IU/L (ref 44–121)
BUN/Creatinine Ratio: 12 (ref 12–28)
BUN: 14 mg/dL (ref 8–27)
Bilirubin Total: 0.2 mg/dL (ref 0.0–1.2)
CO2: 21 mmol/L (ref 20–29)
Calcium: 10 mg/dL (ref 8.7–10.3)
Chloride: 99 mmol/L (ref 96–106)
Creatinine, Ser: 1.17 mg/dL — ABNORMAL HIGH (ref 0.57–1.00)
Globulin, Total: 2.8 g/dL (ref 1.5–4.5)
Glucose: 175 mg/dL — ABNORMAL HIGH (ref 65–99)
Potassium: 4.7 mmol/L (ref 3.5–5.2)
Sodium: 136 mmol/L (ref 134–144)
Total Protein: 6.9 g/dL (ref 6.0–8.5)
eGFR: 50 mL/min/{1.73_m2} — ABNORMAL LOW (ref >59)

## 2020-12-01 LAB — LIPID PANEL
Chol/HDL Ratio: 3.6 ratio (ref 0.0–4.4)
Cholesterol, Total: 146 mg/dL (ref 100–199)
HDL: 41 mg/dL (ref >39)
LDL Chol Calc (NIH): 82 mg/dL (ref 0–99)
Triglycerides: 131 mg/dL (ref 0–149)
VLDL Cholesterol Cal: 23 mg/dL (ref 5–40)

## 2020-12-02 LAB — VITAMIN B12: Vitamin B-12: 269 pg/mL (ref 232–1245)

## 2020-12-02 LAB — SPECIMEN STATUS REPORT

## 2020-12-13 DIAGNOSIS — I10 Essential (primary) hypertension: Secondary | ICD-10-CM | POA: Diagnosis not present

## 2020-12-13 DIAGNOSIS — Z794 Long term (current) use of insulin: Secondary | ICD-10-CM | POA: Diagnosis not present

## 2020-12-13 DIAGNOSIS — N390 Urinary tract infection, site not specified: Secondary | ICD-10-CM | POA: Diagnosis not present

## 2020-12-13 DIAGNOSIS — Z955 Presence of coronary angioplasty implant and graft: Secondary | ICD-10-CM | POA: Diagnosis not present

## 2020-12-13 DIAGNOSIS — I25118 Atherosclerotic heart disease of native coronary artery with other forms of angina pectoris: Secondary | ICD-10-CM | POA: Diagnosis not present

## 2020-12-13 DIAGNOSIS — E119 Type 2 diabetes mellitus without complications: Secondary | ICD-10-CM | POA: Diagnosis not present

## 2020-12-14 NOTE — Telephone Encounter (Signed)
New referral order placed

## 2020-12-27 ENCOUNTER — Other Ambulatory Visit: Payer: Self-pay | Admitting: Family Medicine

## 2021-01-04 ENCOUNTER — Other Ambulatory Visit: Payer: Self-pay | Admitting: Family Medicine

## 2021-01-04 NOTE — Telephone Encounter (Signed)
  Prescription Request  01/04/2021  What is the name of the medication or equipment? Libre  Have you contacted your pharmacy to request a refill? (if applicable) yes  Which pharmacy would you [facility] [place]   Patient notified that their request is being sent to the clinical staff for review and that they should receive a response within 2 business days.

## 2021-01-04 NOTE — Telephone Encounter (Signed)
Pt calling to check on the status of Libre and asked if another message could be put in

## 2021-01-09 ENCOUNTER — Other Ambulatory Visit: Payer: Self-pay | Admitting: Family Medicine

## 2021-01-09 ENCOUNTER — Other Ambulatory Visit: Payer: Self-pay

## 2021-01-09 ENCOUNTER — Ambulatory Visit (INDEPENDENT_AMBULATORY_CARE_PROVIDER_SITE_OTHER): Payer: PPO | Admitting: Family Medicine

## 2021-01-09 ENCOUNTER — Encounter: Payer: Self-pay | Admitting: Family Medicine

## 2021-01-09 VITALS — BP 142/74 | HR 86 | Temp 98.5°F | Ht 66.0 in | Wt 161.4 lb

## 2021-01-09 DIAGNOSIS — G2581 Restless legs syndrome: Secondary | ICD-10-CM | POA: Diagnosis not present

## 2021-01-09 DIAGNOSIS — G4701 Insomnia due to medical condition: Secondary | ICD-10-CM | POA: Diagnosis not present

## 2021-01-09 DIAGNOSIS — F411 Generalized anxiety disorder: Secondary | ICD-10-CM

## 2021-01-09 DIAGNOSIS — F339 Major depressive disorder, recurrent, unspecified: Secondary | ICD-10-CM

## 2021-01-09 DIAGNOSIS — N3000 Acute cystitis without hematuria: Secondary | ICD-10-CM

## 2021-01-09 DIAGNOSIS — R8290 Unspecified abnormal findings in urine: Secondary | ICD-10-CM

## 2021-01-09 LAB — URINALYSIS, ROUTINE W REFLEX MICROSCOPIC
Bilirubin, UA: NEGATIVE
Ketones, UA: NEGATIVE
Nitrite, UA: NEGATIVE
Protein,UA: NEGATIVE
Specific Gravity, UA: 1.01 (ref 1.005–1.030)
Urobilinogen, Ur: 1 mg/dL (ref 0.2–1.0)
pH, UA: 5.5 (ref 5.0–7.5)

## 2021-01-09 LAB — MICROSCOPIC EXAMINATION: RBC, Urine: NONE SEEN /hpf (ref 0–2)

## 2021-01-09 MED ORDER — ROPINIROLE HCL 0.5 MG PO TABS: 1.0000 mg | ORAL_TABLET | Freq: Every day | ORAL | 2 refills | Status: DC

## 2021-01-09 MED ORDER — SULFAMETHOXAZOLE-TRIMETHOPRIM 800-160 MG PO TABS: 1.0000 | ORAL_TABLET | Freq: Two times a day (BID) | ORAL | 0 refills | Status: AC

## 2021-01-09 NOTE — Patient Instructions (Signed)

## 2021-01-09 NOTE — Progress Notes (Signed)
Established Patient Office Visit  Subjective:  Patient ID: [name], female    DOB: [birth year]  Age: 70 y.o. MRN: [id number]  CC:  Chief Complaint  Patient presents with  . Depression    HPI [name] presents for anxiety and depression. She feels like her depression and anxiety is much better controlled with zoloft. She does still struggle with insomnia. She wakes 3-4 times night. When this happen she watches TV for 30-45 minutes until she falls asleep again. She does still continue to have restless leg symptoms.   She also reports a bad odor to her urine. It also looks darker than usual. Denies fever, chills, flank pain, dysuria, or abdominal pain. She does not drink much water at all. She has recently struggled with recurrent UTIs.   Depression screen [facility] 2/9 01/09/2021 11/02/2020 11/02/2020  Decreased Interest 0 3 0  Down, Depressed, Hopeless 0 3 0  PHQ - 2 Score 0 6 0  Altered sleeping 3 3 -  Tired, decreased energy 3 3 -  Change in appetite 0 0 -  Feeling bad or failure about yourself  0 0 -  Trouble concentrating 0 0 -  Moving slowly or fidgety/restless 0 0 -  Suicidal thoughts 0 0 -  PHQ-9 Score 6 12 -  Difficult doing work/chores Somewhat difficult Somewhat difficult -  Some recent data might be hidden   GAD 7 : Generalized Anxiety Score 01/09/2021 11/02/2020 03/02/2020  Nervous, Anxious, on Edge 1 3 3   Control/stop worrying 0 3 3  Worry too much - different things 0 3 0  Trouble relaxing 0 0 3  Restless 0 0 0  Easily annoyed or irritable 0 1 3  Afraid - awful might happen 0 0 1  Total GAD 7 Score 1 10 13   Anxiety Difficulty Somewhat difficult Not difficult at all Somewhat difficult      Past Medical History:  Diagnosis Date  . Anxiety   . CAD (coronary artery disease)    DES to circumflex 02/2007, BMS to LAD and PTCA diagonal 03/2007  . Carotid artery plaque    Mild  . Cataract   . Depression   . Diverticulitis, colon   . Elevated d-dimer 01/08/2014  .  Essential hypertension, benign   . GERD (gastroesophageal reflux disease)   . H/O hiatal hernia   . HLD (hyperlipidemia)   . IDDM (insulin dependent diabetes mellitus)   . Migraine    "used to have them really bad; don't have them anymore" (01/07/2014)  . MS (multiple sclerosis) ([place])    Not confirmed  . PAT (paroxysmal atrial tachycardia) ([place])   . Prolapse of uterus   . PVD (peripheral vascular disease) ([place])   . TIA (transient ischemic attack) 1980's    Past Surgical History:  Procedure Laterality Date  . ABDOMINAL HYSTERECTOMY  1986   ovaries remain - prolaspe uterus   . APPENDECTOMY  ~ 1970  . BREAST BIOPSY Right 1980's  . BREAST LUMPECTOMY Right 1980's   Dr. Charlynne Pander   . CARDIAC CATHETERIZATION  01/07/2014  . CHOLECYSTECTOMY  ?1987  . COLONOSCOPY  2002   Dr. Anwar--> Severe diverticular changes in the region of the sigmoid and descending colon with scattered diverticular changes throughout the rest of the colon. No polyps, ulcerations. Despite numerous manipulations, the tip of the scope could not be tipped into the cecal area.  . COLONOSCOPY  01/10/2012   Procedure: COLONOSCOPY;  Surgeon: Daneil Dolin, MD;  [facility]  [facility];  Service: Endoscopy;  Laterality: N/A;  1:55  . CORONARY ANGIOPLASTY WITH STENT PLACEMENT  ~ 1997 X 2   "2 + 1"  . EYE SURGERY Bilateral 2014   cataract  . INTRAVASCULAR PRESSURE WIRE/FFR STUDY N/A 03/08/2017   Procedure: Intravascular Pressure Wire/FFR Study;  Surgeon: Nelva Bush, MD;  Location: [place] [facility];  Service: Cardiovascular;  Laterality: N/A;  . LEFT HEART CATH AND CORONARY ANGIOGRAPHY N/A 03/08/2017   Procedure: Left Heart Cath and Coronary Angiography;  Surgeon: Nelva Bush, MD;  Location: [place] [facility];  Service: Cardiovascular;  Laterality: N/A;  . LEFT HEART CATHETERIZATION WITH CORONARY ANGIOGRAM N/A 01/07/2014   Procedure: LEFT HEART CATHETERIZATION WITH CORONARY ANGIOGRAM;  Surgeon: Larey Dresser, MD;   Location: [facility] CATH LAB;  Service: Cardiovascular;  Laterality: N/A;    Family History  Problem Relation Age of Onset  . Heart attack Mother 10  . Diabetes Mother   . Hypertension Mother   . Heart attack Father 87  . Heart attack Brother 32       x 6  . Heart disease Brother   . Diabetes Brother   . Colon cancer Paternal Aunt        20s, died with brain anuerysm  . Crohn's disease Cousin        paternal  . Diabetes Sister   . GER disease Daughter   . Cervical cancer Daughter   . Diabetes Daughter     Social History   Socioeconomic History  . Marital status: Widowed    Spouse name: Not on file  . Number of children: 4  . Years of education: 77  . Highest education level: 11th grade  Occupational History  . Occupation: Disability    Employer: DISABLED  Tobacco Use  . Smoking status: Never Smoker  . Smokeless tobacco: Never Used  . Tobacco comment: spouse, 7 years - husband has quit 01/2011  Vaping Use  . Vaping Use: Never used  Substance and Sexual Activity  . Alcohol use: No  . Drug use: No  . Sexual activity: Not Currently  Other Topics Concern  . Not on file  Social History Narrative  . Not on file   Social Determinants of Health   Financial Resource Strain: Not on file  Food Insecurity: Not on file  Transportation Needs: Not on file  Physical Activity: Not on file  Stress: Not on file  Social Connections: Not on file  Intimate Partner Violence: Not on file    Outpatient Medications Prior to Visit  Medication Sig Dispense Refill  . amLODipine (NORVASC) 10 MG tablet Take 1 tablet (10 mg total) by mouth daily. 30 tablet 5  . aspirin 325 MG tablet Take 325 mg by mouth daily.    Marland Kitchen atenolol (TENORMIN) 50 MG tablet Take 1 tablet (50 mg total) by mouth 2 (two) times daily. 180 tablet 3  . calcium carbonate (OS-CAL) 600 MG TABS tablet Take 1 tablet (600 mg total) by mouth 2 (two) times daily with a meal. 180 tablet 3  . Continuous Blood Gluc Sensor (FREESTYLE  LIBRE 14 DAY SENSOR) MISC USE TO CHECH BLOOD GLUCOSE 1 SENSOR EVERY 14 DAYS 8 each 3  . empagliflozin (JARDIANCE) 10 MG TABS tablet Take 10 mg by mouth daily. 30 tablet 3  . esomeprazole (NEXIUM) 40 MG capsule Take 1 capsule (40 mg total) by mouth daily. 30 capsule 3  . insulin glargine, 1 Unit Dial, (TOUJEO SOLOSTAR) 300 UNIT/ML Solostar  Pen Inject 53 Units into the skin daily. 9 mL 2  . isosorbide mononitrate (IMDUR) 60 MG 24 hr tablet Take 1 tablet (60 mg total) by mouth daily. 30 tablet 0  . meclizine (ANTIVERT) 25 MG tablet Take 1 tablet (25 mg total) by mouth 3 (three) times daily as needed for dizziness. 90 tablet 3  . nitroGLYCERIN (NITROSTAT) 0.4 MG SL tablet DISSOLVE 1 TABLET UNDER TONGUE FOR CHESTPAIN.MAY REPEAT EVERY 5 MINUTES FOR 3 DOSES.IF NO RELIEF CALL 911 OR GO TO ER 25 tablet 6  . NOVOLOG FLEXPEN 100 UNIT/ML FlexPen INJECT 35-40 UNITS SQ 3 TIMES DAILY WITH MEALS (Patient taking differently: 8-30 Units. Takes 8-30 units TID with meals depending on glucose) 30 mL 3  . ondansetron (ZOFRAN) 8 MG tablet Take 0.5 tablets (4 mg total) by mouth every 6 (six) hours as needed. 60 tablet 0  . rOPINIRole (REQUIP) 0.5 MG tablet Take 1 tablet (0.5 mg total) by mouth at bedtime. 90 tablet 3  . rosuvastatin (CRESTOR) 40 MG tablet TAKE ONE (1) TABLET EACH DAY (Patient taking differently: Take 40 mg by mouth daily.) 90 tablet 3  . Semaglutide,0.25 or 0.5MG /DOS, (OZEMPIC, 0.25 OR 0.5 MG/DOSE,) 2 MG/1.5ML SOPN Inject 0.375 mLs (0.5 mg total) into the skin once a week. 1.5 mL 11  . sertraline (ZOLOFT) 50 MG tablet Take 1 tablet (50 mg total) by mouth daily. 30 tablet 3  . vitamin B-12 (CYANOCOBALAMIN) 1000 MCG tablet Take 1,000 mcg by mouth daily.    . Vitamin D, Ergocalciferol, (DRISDOL) 1.25 MG (50000 UNIT) CAPS capsule TAKE 1 CAPSULE EVERY 7 DAYS 12 capsule 3  . cephALEXin (KEFLEX) 500 MG capsule Take 500 mg by mouth at bedtime.     No facility-administered medications prior to visit.     Allergies  Allergen Reactions  . Iohexol      Desc: pt had syncopal episode with nausea post IV CM late 1990's,  pt has had prednisone prep with heart caths x 2 without problem  kdean 04/16/07, Onset Date: [id number]   . Ticlid [Ticlopidine Hcl] Nausea And Vomiting  . Codeine Nausea And Vomiting and Palpitations    ROS Review of Systems As per HPI.    Objective:    Physical Exam Vitals and nursing note reviewed.  Constitutional:      General: She is not in acute distress.    Appearance: Normal appearance. She is not ill-appearing, toxic-appearing or diaphoretic.  HENT:     Head: Normocephalic and atraumatic.  Cardiovascular:     Rate and Rhythm: Normal rate and regular rhythm.     Heart sounds: Normal heart sounds. No murmur heard.   Pulmonary:     Effort: Pulmonary effort is normal. No respiratory distress.     Breath sounds: Normal breath sounds.  Musculoskeletal:     Right lower leg: No edema.     Left lower leg: No edema.  Skin:    General: Skin is warm and dry.  Neurological:     General: No focal deficit present.     Mental Status: She is alert and oriented to person, place, and time.  Psychiatric:        Mood and Affect: Mood normal.        Behavior: Behavior normal.     BP (!) 142/74   Pulse 86   Temp 98.5 F (36.9 C) (Temporal)   Ht 5\' 6"  (1.676 m)   Wt 161 lb 6 oz (73.2 kg)   BMI 26.05 kg/m  Wt Readings from Last 3 Encounters:  01/09/21 161 lb 6 oz (73.2 kg)  11/30/20 156 lb (70.8 kg)  11/23/20 156 lb 8 oz (71 kg)     Health Maintenance Due  Topic Date Due  . MAMMOGRAM  06/25/2020  . URINE MICROALBUMIN  03/02/2021    There are no preventive care reminders to display for this patient.  Lab Results  Component Value Date   TSH 3.372 11/15/2020   Lab Results  Component Value Date   WBC 7.0 11/30/2020   HGB 10.9 (L) 11/30/2020   HCT 32.9 (L) 11/30/2020   MCV 87 11/30/2020   PLT 191 11/30/2020   Lab Results  Component Value Date    NA 136 11/30/2020   K 4.7 11/30/2020   CO2 21 11/30/2020   GLUCOSE 175 (H) 11/30/2020   BUN 14 11/30/2020   CREATININE 1.17 (H) 11/30/2020   BILITOT 0.2 11/30/2020   ALKPHOS 59 11/30/2020   AST 19 11/30/2020   ALT 11 11/30/2020   PROT 6.9 11/30/2020   ALBUMIN 4.1 11/30/2020   CALCIUM 10.0 11/30/2020   ANIONGAP 8 11/16/2020   Lab Results  Component Value Date   CHOL 146 11/30/2020   Lab Results  Component Value Date   HDL 41 11/30/2020   Lab Results  Component Value Date   LDLCALC 82 11/30/2020   Lab Results  Component Value Date   TRIG 131 11/30/2020   Lab Results  Component Value Date   CHOLHDL 3.6 11/30/2020   Lab Results  Component Value Date   HGBA1C 9.3 (H) 11/30/2020      Assessment & Plan:   Connor was seen today for depression.  Diagnoses and all orders for this visit:  Depression, recurrent ([place]) Improving with zoloft. PHQ down to 6 from 12 at last visit. Discussed sleep hygiene for insomnia. Continue current regimen.   Generalized anxiety disorder Well controlled on current regimen of zoloft.   Insomnia due to medical condition Discussed sleep hygiene. Will increase requip to better control RLS.  RLS (restless legs syndrome) Increase to 1 mg nightly. -     rOPINIRole (REQUIP) 0.5 MG tablet; Take 2 tablets (1 mg total) by mouth at bedtime.  Bad odor of urine Discussed increasing water intake. Labs pending as below given history of recurrent UTIs.  -     Urinalysis, Routine w reflex microscopic -     Urine Culture  Follow-up: Return in about 6 weeks (around 02/20/2021) for chronic follow up.   The patient indicates understanding of these issues and agrees with the plan.    Gwenlyn Perking, FNP

## 2021-01-12 LAB — URINE CULTURE

## 2021-01-20 ENCOUNTER — Ambulatory Visit (INDEPENDENT_AMBULATORY_CARE_PROVIDER_SITE_OTHER): Payer: PPO

## 2021-01-20 ENCOUNTER — Other Ambulatory Visit: Payer: Self-pay | Admitting: Family Medicine

## 2021-01-20 VITALS — Ht 66.0 in | Wt 161.0 lb

## 2021-01-20 DIAGNOSIS — Z0000 Encounter for general adult medical examination without abnormal findings: Secondary | ICD-10-CM | POA: Diagnosis not present

## 2021-01-20 DIAGNOSIS — Z1231 Encounter for screening mammogram for malignant neoplasm of breast: Secondary | ICD-10-CM

## 2021-01-20 NOTE — Progress Notes (Signed)
Subjective:   [name] is a 70 y.o. female who presents for Medicare Annual (Subsequent) preventive examination.  Virtual Visit via Telephone Note  I connected with  [name] on 01/20/21 at  9:00 AM EDT by telephone and verified that I am speaking with the correct person using two identifiers.  Location: Patient: Home Provider: [facility] Persons participating in the virtual visit: patient/Nurse Health Advisor   I discussed the limitations, risks, security and privacy concerns of performing an evaluation and management service by telephone and the availability of in person appointments. The patient expressed understanding and agreed to proceed.  Interactive audio and video telecommunications were attempted between this nurse and patient, however failed, due to patient having technical difficulties OR patient did not have access to video capability.  We continued and completed visit with audio only.  Some vital signs may be absent or patient reported.   Ezme Duch E Rodrigues Urbanek, LPN   Review of Systems     Cardiac Risk Factors include: advanced age (>80men, >32 women);diabetes mellitus;dyslipidemia;hypertension;sedentary lifestyle;Other (see comment) (hx of covid)     Objective:    Today's Vitals   01/20/21 0851  Weight: 161 lb (73 kg)  Height: 5\' 6"  (1.676 m)   Body mass index is 25.99 kg/m.  Advanced Directives 01/20/2021 11/15/2020 10/10/2020 09/24/2020 04/07/2020 01/20/2020 01/27/2019  Does Patient Have a Medical Advance Directive? No No No No No No Yes  Type of Advance Directive - - - - - - Press photographer;Living will  Does patient want to make changes to medical advance directive? - - - - - - No - Patient declined  Copy of [place] in Chart? - - - - - - No - copy requested  Would patient like information on creating a medical advance directive? No - Patient declined No - Patient declined No - Patient declined - - No - Patient declined  -  Pre-existing out of facility DNR order (yellow form or pink MOST form) - - - - - - -    Current Medications (verified) Outpatient Encounter Medications as of 01/20/2021  Medication Sig  . amLODipine (NORVASC) 10 MG tablet Take 1 tablet (10 mg total) by mouth daily.  Marland Kitchen aspirin 325 MG tablet Take 325 mg by mouth daily.  Marland Kitchen atenolol (TENORMIN) 50 MG tablet Take 1 tablet (50 mg total) by mouth 2 (two) times daily.  . calcium carbonate (OS-CAL) 600 MG TABS tablet Take 1 tablet (600 mg total) by mouth 2 (two) times daily with a meal.  . Continuous Blood Gluc Sensor (FREESTYLE LIBRE 14 DAY SENSOR) MISC USE TO CHECH BLOOD GLUCOSE 1 SENSOR EVERY 14 DAYS  . empagliflozin (JARDIANCE) 10 MG TABS tablet Take 10 mg by mouth daily.  Marland Kitchen esomeprazole (NEXIUM) 40 MG capsule Take 1 capsule (40 mg total) by mouth daily.  . furosemide (LASIX) 20 MG tablet Take 20 mg by mouth.  . insulin glargine, 1 Unit Dial, (TOUJEO SOLOSTAR) 300 UNIT/ML Solostar Pen Inject 53 Units into the skin daily.  . meclizine (ANTIVERT) 25 MG tablet Take 1 tablet (25 mg total) by mouth 3 (three) times daily as needed for dizziness.  . nitroGLYCERIN (NITROSTAT) 0.4 MG SL tablet DISSOLVE 1 TABLET UNDER TONGUE FOR CHESTPAIN.MAY REPEAT EVERY 5 MINUTES FOR 3 DOSES.IF NO RELIEF CALL 911 OR GO TO ER  . NOVOLOG FLEXPEN 100 UNIT/ML FlexPen INJECT 35-40 UNITS SQ 3 TIMES DAILY WITH MEALS (Patient taking differently: 8-30 Units. Takes 8-30  units TID with meals depending on glucose)  . ondansetron (ZOFRAN) 8 MG tablet Take 0.5 tablets (4 mg total) by mouth every 6 (six) hours as needed.  Marland Kitchen rOPINIRole (REQUIP) 0.5 MG tablet Take 2 tablets (1 mg total) by mouth at bedtime.  . rosuvastatin (CRESTOR) 40 MG tablet TAKE ONE (1) TABLET EACH DAY (Patient taking differently: Take 40 mg by mouth daily.)  . Semaglutide,0.25 or 0.5MG /DOS, (OZEMPIC, 0.25 OR 0.5 MG/DOSE,) 2 MG/1.5ML SOPN Inject 0.375 mLs (0.5 mg total) into the skin once a week.  . sertraline  (ZOLOFT) 50 MG tablet Take 1 tablet (50 mg total) by mouth daily.  . vitamin B-12 (CYANOCOBALAMIN) 1000 MCG tablet Take 1,000 mcg by mouth daily.  . Vitamin D, Ergocalciferol, (DRISDOL) 1.25 MG (50000 UNIT) CAPS capsule TAKE 1 CAPSULE EVERY 7 DAYS  . isosorbide mononitrate (IMDUR) 60 MG 24 hr tablet Take 1 tablet (60 mg total) by mouth daily.   No facility-administered encounter medications on file as of 01/20/2021.    Allergies (verified) Iohexol, Ticlid [ticlopidine hcl], and Codeine   History: Past Medical History:  Diagnosis Date  . Anxiety   . CAD (coronary artery disease)    DES to circumflex 02/2007, BMS to LAD and PTCA diagonal 03/2007  . Carotid artery plaque    Mild  . Cataract   . Depression   . Diverticulitis, colon   . Elevated d-dimer 01/08/2014  . Essential hypertension, benign   . GERD (gastroesophageal reflux disease)   . H/O hiatal hernia   . HLD (hyperlipidemia)   . IDDM (insulin dependent diabetes mellitus)   . Migraine    "used to have them really bad; don't have them anymore" (01/07/2014)  . MS (multiple sclerosis) ([place])    Not confirmed  . PAT (paroxysmal atrial tachycardia) ([place])   . Prolapse of uterus   . PVD (peripheral vascular disease) ([place])   . TIA (transient ischemic attack) 1980's   Past Surgical History:  Procedure Laterality Date  . ABDOMINAL HYSTERECTOMY  1986   ovaries remain - prolaspe uterus   . APPENDECTOMY  ~ 1970  . BREAST BIOPSY Right 1980's  . BREAST LUMPECTOMY Right 1980's   Dr. Charlynne Pander   . CARDIAC CATHETERIZATION  01/07/2014  . CHOLECYSTECTOMY  ?1987  . COLONOSCOPY  2002   Dr. Anwar--> Severe diverticular changes in the region of the sigmoid and descending colon with scattered diverticular changes throughout the rest of the colon. No polyps, ulcerations. Despite numerous manipulations, the tip of the scope could not be tipped into the cecal area.  . COLONOSCOPY  01/10/2012   Procedure: COLONOSCOPY;  Surgeon: Daneil Dolin, MD;   Location: [facility];  Service: Endoscopy;  Laterality: N/A;  1:55  . CORONARY ANGIOPLASTY WITH STENT PLACEMENT  ~ 1997 X 2   "2 + 1"  . EYE SURGERY Bilateral 2014   cataract  . INTRAVASCULAR PRESSURE WIRE/FFR STUDY N/A 03/08/2017   Procedure: Intravascular Pressure Wire/FFR Study;  Surgeon: Nelva Bush, MD;  Location: [facility];  Service: Cardiovascular;  Laterality: N/A;  . LEFT HEART CATH AND CORONARY ANGIOGRAPHY N/A 03/08/2017   Procedure: Left Heart Cath and Coronary Angiography;  Surgeon: Nelva Bush, MD;  Location: [facility];  Service: Cardiovascular;  Laterality: N/A;  . LEFT HEART CATHETERIZATION WITH CORONARY ANGIOGRAM N/A 01/07/2014   Procedure: LEFT HEART CATHETERIZATION WITH CORONARY ANGIOGRAM;  Surgeon: Larey Dresser, MD;  Location: [facility] CATH LAB;  Service: Cardiovascular;  Laterality: N/A;  Family History  Problem Relation Age of Onset  . Heart attack Mother 12  . Diabetes Mother   . Hypertension Mother   . Heart attack Father 3  . Heart attack Brother 32       x 6  . Heart disease Brother   . Diabetes Brother   . Colon cancer Paternal Aunt        85s, died with brain anuerysm  . Crohn's disease Cousin        paternal  . Diabetes Sister   . GER disease Daughter   . Cervical cancer Daughter   . Diabetes Daughter    Social History   Socioeconomic History  . Marital status: Widowed    Spouse name: Not on file  . Number of children: 4  . Years of education: 56  . Highest education level: 11th grade  Occupational History  . Occupation: Disability    Employer: DISABLED  Tobacco Use  . Smoking status: Never Smoker  . Smokeless tobacco: Never Used  . Tobacco comment: spouse, 28 years - husband has quit 01/2011  Vaping Use  . Vaping Use: Never used  Substance and Sexual Activity  . Alcohol use: No  . Drug use: No  . Sexual activity: Not Currently  Other Topics Concern  . Not on file  Social History Narrative   Lives alone, one  level, handicap accessible bathroom, her children all live nearby   Social Determinants of Health   Financial Resource Strain: Low Risk   . Difficulty of Paying Living Expenses: Not very hard  Food Insecurity: No Food Insecurity  . Worried About Charity fundraiser in the Last Year: Never true  . Ran Out of Food in the Last Year: Never true  Transportation Needs: No Transportation Needs  . Lack of Transportation (Medical): No  . Lack of Transportation (Non-Medical): No  Physical Activity: Inactive  . Days of Exercise per Week: 0 days  . Minutes of Exercise per Session: 0 min  Stress: No Stress Concern Present  . Feeling of Stress : Only a little  Social Connections: Moderately Integrated  . Frequency of Communication with Friends and Family: More than three times a week  . Frequency of Social Gatherings with Friends and Family: More than three times a week  . Attends Religious Services: More than 4 times per year  . Active Member of Clubs or Organizations: Yes  . Attends Archivist Meetings: More than 4 times per year  . Marital Status: Widowed    Tobacco Counseling Counseling given: Not Answered Comment: spouse, 15 years - husband has quit 01/2011   Clinical Intake:  Pre-visit preparation completed: Yes  Pain : No/denies pain     BMI - recorded: 25.99 Nutritional Status: BMI 25 -29 Overweight Nutritional Risks: Nausea/ vomitting/ diarrhea,Unintentional weight loss (nausea and dizziness since covid last year) Diabetes: Yes CBG done?: No Did pt. bring in CBG monitor from home?: No  How often do you need to have someone help you when you read instructions, pamphlets, or other written materials from your doctor or pharmacy?: 1 - Never  Nutrition Risk Assessment:  Has the patient had any N/V/D within the last 2 months?  Yes  Does the patient have any non-healing wounds?  No  Has the patient had any unintentional weight loss or weight gain?  Yes    Diabetes:  Is the patient diabetic?  Yes  If diabetic, was a CBG obtained today?  No  Did the  patient bring in their glucometer from home?  No  How often do you monitor your CBG's? Freestyle Elenor Legato - keeps a check on it throughout the day.   Financial Strains and Diabetes Management:  Are you having any financial strains with the device, your supplies or your medication? No .  Does the patient want to be seen by Chronic Care Management for management of their diabetes?  No  Would the patient like to be referred to a Nutritionist or for Diabetic Management?  No   Diabetic Exams:  Diabetic Eye Exam: Completed 09/28/2019. Overdue for diabetic eye exam. Pt has been advised about the importance in completing this exam.She is going to call or stop by to make appt with Dr Marin Comment. Diabetic Foot Exam: Completed 11/30/20. Pt has been advised about the importance in completing this exam. Pt is scheduled for diabetic foot exam on next visit.    Interpreter Needed?: No  Information entered by :: Donis Kotowski, LPN   Activities of Daily Living In your present state of health, do you have any difficulty performing the following activities: 01/20/2021 10/11/2020  Hearing? N N  Vision? N Y  Difficulty concentrating or making decisions? Y N  Walking or climbing stairs? N Y  Dressing or bathing? N N  Doing errands, shopping? N N  Preparing Food and eating ? N -  Using the Toilet? N -  In the past six months, have you accidently leaked urine? N -  Do you have problems with loss of bowel control? N -  Managing your Medications? N -  Managing your Finances? N -  Housekeeping or managing your Housekeeping? N -  Some recent data might be hidden    Patient Care Team: Gwenlyn Perking, FNP as PCP - General (Family Medicine) Gala Romney Cristopher Estimable, MD (Gastroenterology) Minus Breeding, MD (Cardiology) Ilean China, RN as Registered Nurse  Indicate any recent Medical Services you may have received from  other than [facility] providers in the past year (date may be approximate).     Assessment:   This is a routine wellness examination for [place].  Hearing/Vision screen  Hearing Screening   125Hz  250Hz  500Hz  1000Hz  2000Hz  3000Hz  4000Hz  6000Hz  8000Hz   Right ear:           Left ear:           Comments: Denies hearing difficulties  Vision Screening Comments: Wears reading glasses only - Annual visits with Dr Marin Comment at [facility] - behind on eye exam  Dietary issues and exercise activities discussed: Current Exercise Habits: The patient does not participate in regular exercise at present, Exercise limited by: neurologic condition(s);cardiac condition(s)  Goals    .  "I need help with my medication costs" (pt-stated)      Current Barriers:  Marland Kitchen Knowledge Deficits related to patient assistance programs . Film/video editor.   Nurse Case Manager Clinical Goal(s):  Marland Kitchen Over the next 30 days, patient will work with CM team pharmacist to complete patient assistance forms.  Interventions:  . Reviewed medications with patient and discussed cost . Pharmacy referral for Rx Patient Assistance  Patient Self Care Activities:  . Self administers medications as prescribed . Calls pharmacy for medication refills  Initial goal documentation      .  awv      01/20/2020 AWV Goal: Fall Prevention  . Over the next year, patient will decrease their risk for falls by: o Using assistive devices, such as a cane or walker, as needed  o Identifying fall risks within their home and correcting them by: - Removing throw rugs - Adding handrails to stairs or ramps - Removing clutter and keeping a clear pathway throughout the home - Increasing light, especially at night - Adding shower handles/bars - Raising toilet seat o Identifying potential personal risk factors for falls: - Medication side effects - Incontinence/urgency - Vestibular dysfunction - Hearing loss - Musculoskeletal  disorders - Neurological disorders - Orthostatic hypotension  01/20/2020 AWV Goal: Diabetes Management  . Patient will maintain an A1C level below 8.0 . Patient will not develop any diabetic foot complications . Patient will not experience any hypoglycemic episodes over the next 3 months . Patient will notify our office of any CBG readings outside of the provider recommended range by calling [phone] . Patient will adhere to provider recommendations for diabetes management  Patient Self Management Activities . take all medications as prescribed and report any negative side effects . monitor and record blood sugar readings as directed . adhere to a low carbohydrate diet that incorporates lean proteins, vegetables, whole grains, low glycemic fruits . check feet daily noting any sores, cracks, injuries, or callous formations . see PCP or podiatrist if she notices any changes in her legs, feet, or toenails . Patient will visit PCP and have an A1C level checked every 3 to 6 months as directed  . have a yearly eye exam to monitor for vascular changes associated with diabetes and will request that the report be sent to her pcp.  . consult with her PCP regarding any changes in her health or new or worsening symptoms     .  AWV goal      01/19/2019 AWV Goal: Medication Compliance  . Over the next year, patient will take of prescription medications as directed and will report any side effects to the prescribing provider.  . Over the next year, patient will advise prescribing provider of any dificulty with obtaining prescription medications. . Over the next year, patient will work with prescribing provider and community resources to obtain prescription assistance if needed and if qualified.   01/19/2019 AWV Goal: Keep All Scheduled Appointments  Over the next year, patient will attend all scheduled appointments with their PCP and any specialists that they see.    .  Diabetes Management  (pt-stated)      Current Barriers:  . Chronic Disease Management support and education needs related to diabetes  Nurse Case Manager Clinical Goal(s):  Marland Kitchen Over the next 30 days, patient will work with the RN CCM to discuss diabetes management  Interventions:  . Chart reviewed . Collaborated with PCP . Talked with patient by telephone . Verified that patient has decreased daily dose of Toujeo by 4 units . Reviewed medications and discussed Novolin. Verified that patient is taking 35-40 units TID with meals . Discussed blood sugar readings. No hypoglycemic events since last time we talked. o Blood sugar at 11:30 was 186. Had sausage biscuit and coffee around 7:00 am today . Reviewed upcoming appointments: Dr Warrick Parisian in March. Due for A1C then. . Advised patient to continue monitoring blood sugar with CGM and to reach out to PCP at [phone] with any readings outside of recommended range . RN will f/u with patient by telephone over the next 14 days to assess changes in blood sugar. Will collaborate with PCP afterwards.   Patient Self Care Activities:  . Performs ADL's independently . Performs IADL's independently   Please see past updates related to this goal  by clicking on the "Past Updates" button in the selected goal      .  Exercise 3x per week (15-20 min per time)      Stationary bicycle is a great option for exercise due to balance issues.    .  Increase physical activity      Goal is 150 minutes per week Goals Addressed             This Visit's Progress   . AWV goal   On track    01/19/2019 AWV Goal: Medication Compliance  . Over the next year, patient will take of prescription medications as directed and will report any side effects to the prescribing provider.  . Over the next year, patient will advise prescribing provider of any dificulty with obtaining prescription medications. . Over the next year, patient will work with prescribing provider and community  resources to obtain prescription assistance if needed and if qualified.   01/19/2019 AWV Goal: Keep All Scheduled Appointments  Over the next year, patient will attend all scheduled appointments with their PCP and any specialists that they see.    . Increase physical activity   Not on track    Goal is 150 minutes per week      . LIFESTYLE - DECREASE FALLS RISK       Goals Addressed             This Visit's Progress   . AWV goal   On track    01/19/2019 AWV Goal: Medication Compliance  . Over the next year, patient will take of prescription medications as directed and will report any side effects to the prescribing provider.  . Over the next year, patient will advise prescribing provider of any dificulty with obtaining prescription medications. . Over the next year, patient will work with prescribing provider and community resources to obtain prescription assistance if needed and if qualified.   01/19/2019 AWV Goal: Keep All Scheduled Appointments  Over the next year, patient will attend all scheduled appointments with their PCP and any specialists that they see.    Marland Kitchen LIFESTYLE - DECREASE FALLS RISK                    .  LIFESTYLE - DECREASE FALLS RISK      Goals Addressed             This Visit's Progress   . AWV goal   On track    01/19/2019 AWV Goal: Medication Compliance  . Over the next year, patient will take of prescription medications as directed and will report any side effects to the prescribing provider.  . Over the next year, patient will advise prescribing provider of any dificulty with obtaining prescription medications. . Over the next year, patient will work with prescribing provider and community resources to obtain prescription assistance if needed and if qualified.   01/19/2019 AWV Goal: Keep All Scheduled Appointments  Over the next year, patient will attend all scheduled appointments with their PCP and any specialists that they see.    Marland Kitchen  LIFESTYLE - DECREASE FALLS RISK             .  Reduce sugar intake        Increase non-starchy vegetables - carrots, green bean, squash, zucchini, tomatoes, onions, peppers, spinach and other green leafy vegetables, cabbage, lettuce, cucumbers, asparagus, okra (not fried), eggplant limit sugar and processed foods (cakes, cookies, ice cream, crackers and chips) Increase fresh fruit  but limit serving sizes 1/2 cup or about the size of tennis or baseball limit red meat to no more than 1-2 times per week (serving size about the size of your palm) Choose whole grains / lean proteins - whole wheat bread, quinoa, whole grain rice (1/2 cup), fish, chicken, [place]       Depression Screen [facility] 2/9 Scores 01/20/2021 01/09/2021 11/30/2020 11/02/2020 11/02/2020 10/17/2020 07/12/2020  PHQ - 2 Score 1 0 - 6 0 0 0  PHQ- 9 Score 5 6 - 12 - - -  Exception Documentation - - Patient refusal - - - -    Fall Risk Fall Risk  01/20/2021 11/30/2020 11/23/2020 11/02/2020 10/17/2020  Falls in the past year? 1 1 1 1 1   Number falls in past yr: 1 1 1 1 1   Injury with Fall? 0 1 1 0 0  Comment - - - - -  Risk Factor Category  - - - - -  Risk for fall due to : History of fall(s);Impaired balance/gait History of fall(s) History of fall(s) History of fall(s) -  Risk for fall due to: Comment - - - - -  Follow up Education provided;Falls prevention discussed Falls evaluation completed Falls evaluation completed Falls prevention discussed Falls prevention discussed    FALL RISK PREVENTION PERTAINING TO THE HOME:  Any stairs in or around the home? No  If so, are there any without handrails? No  Home free of loose throw rugs in walkways, pet beds, electrical cords, etc? Yes  Adequate lighting in your home to reduce risk of falls? Yes   ASSISTIVE DEVICES UTILIZED TO PREVENT FALLS:  Life alert? No  Use of a cane, walker or w/c? Yes  Grab bars in the bathroom? Yes  Shower chair or bench in shower? Yes  Elevated toilet seat or a  handicapped toilet? Yes   TIMED UP AND GO:  Was the test performed? No . Telephonic visit.  Cognitive Function: Cognitive status assessed by direct observation. Patient declines screening 6CIT or MMSE. Says her memory isn't great since having covid last year, but it is slowly getting better.  MMSE - Mini Mental State Exam 12/18/2017 06/21/2016 04/06/2015  Orientation to time 5 5 5   Orientation to Place 5 5 5   Registration 3 3 3   Attention/ Calculation 4 5 5   Recall 2 3 3   Language- name 2 objects 2 2 2   Language- repeat 1 1 1   Language- follow 3 step command 3 3 3   Language- read & follow direction 1 1 1   Write a sentence 0 1 1  Copy design 1 0 1  Total score 27 29 30      6CIT Screen 01/20/2020 01/19/2019  What Year? 0 points 0 points  What month? 0 points 0 points  What time? 0 points 0 points  Count back from 20 0 points 0 points  Months in reverse 0 points 0 points  Repeat phrase 0 points 2 points  Total Score 0 2    Immunizations Immunization History  Administered Date(s) Administered  . Fluad Quad(high Dose 65+) 06/17/2019, 07/09/2020  . Influenza, High Dose Seasonal PF 06/19/2017, 06/18/2018  . Influenza,inj,Quad PF,6+ Mos 06/28/2015  . Influenza-Unspecified 05/25/2014, 06/15/2016  . Moderna Sars-Covid-2 Vaccination 11/19/2019, 12/18/2019  . Pneumococcal Conjugate-13 02/08/2014  . Pneumococcal Polysaccharide-23 06/28/2015  . Tdap 11/22/2010    TDAP status: Due, Education has been provided regarding the importance of this vaccine. Advised may receive this vaccine at local pharmacy or Health Dept. Aware  to provide a copy of the vaccination record if obtained from local pharmacy or Health Dept. Verbalized acceptance and understanding.  Flu Vaccine status: Due, Education has been provided regarding the importance of this vaccine. Advised may receive this vaccine at local pharmacy or Health Dept. Aware to provide a copy of the vaccination record if obtained from local  pharmacy or Health Dept. Verbalized acceptance and understanding.  Pneumococcal vaccine status: Up to date  Covid-19 vaccine status: Information provided on how to obtain vaccines.   Qualifies for Shingles Vaccine? Yes   Zostavax completed No   Shingrix Completed?: No.    Education has been provided regarding the importance of this vaccine. Patient has been advised to call insurance company to determine out of pocket expense if they have not yet received this vaccine. Advised may also receive vaccine at local pharmacy or Health Dept. Verbalized acceptance and understanding.  Screening Tests Health Maintenance  Topic Date Due  . MAMMOGRAM  06/25/2020  . URINE MICROALBUMIN  03/02/2021  . COVID-19 Vaccine (3 - Booster for Moderna series) 01/25/2021 (Originally 06/19/2020)  . OPHTHALMOLOGY EXAM  01/31/2021 (Originally 09/27/2020)  . TETANUS/TDAP  11/23/2021 (Originally 11/21/2020)  . INFLUENZA VACCINE  04/24/2021  . HEMOGLOBIN A1C  06/02/2021  . FOOT EXAM  11/30/2021  . COLONOSCOPY (Pts 45-9yrs Insurance coverage will need to be confirmed)  01/09/2022  . DEXA SCAN  07/14/2022  . Hepatitis C Screening  Completed  . PNA vac Low Risk Adult  Completed  . HPV VACCINES  Aged Out  . COLON CANCER SCREENING ANNUAL FOBT  Discontinued    Health Maintenance  Health Maintenance Due  Topic Date Due  . MAMMOGRAM  06/25/2020  . URINE MICROALBUMIN  03/02/2021    Colorectal cancer screening: Type of screening: Colonoscopy. Completed 01/10/2012. Repeat every 10 years  Mammogram status: Ordered 01/20/21. Pt provided with contact info and advised to call to schedule appt.   Bone Density status: Completed 07/14/2020. Results reflect: Bone density results: OSTEOPENIA. Repeat every 2 years.  Lung Cancer Screening: (Low Dose CT Chest recommended if Age 49-80 years, 30 pack-year currently smoking OR have quit w/in 15years.) does not qualify.   Additional Screening:  Hepatitis C Screening: does qualify;  Completed 06/21/2016  Vision Screening: Recommended annual ophthalmology exams for early detection of glaucoma and other disorders of the eye. Is the patient up to date with their annual eye exam?  No  Who is the provider or what is the name of the office in which the patient attends annual eye exams? Dr Conley Rolls in [place] If pt is not established with a provider, would they like to be referred to a provider to establish care? No .   Dental Screening: Recommended annual dental exams for proper oral hygiene  Community Resource Referral / Chronic Care Management: CRR required this visit?  No   CCM required this visit?  No      Plan:     I have personally reviewed and noted the following in the patient's chart:   . Medical and social history . Use of alcohol, tobacco or illicit drugs  . Current medications and supplements . Functional ability and status . Nutritional status . Physical activity . Advanced directives . List of other physicians . Hospitalizations, surgeries, and ER visits in previous 12 months . Vitals . Screenings to include cognitive, depression, and falls . Referrals and appointments  In addition, I have reviewed and discussed with patient certain preventive protocols, quality metrics, and best practice recommendations.  A written personalized care plan for preventive services as well as general preventive health recommendations were provided to patient.     Sandrea Hammond, LPN   [id number]   Nurse Notes: None

## 2021-01-20 NOTE — Patient Instructions (Signed)
[name] , Thank you for taking time to come for your Medicare Wellness Visit. I appreciate your ongoing commitment to your health goals. Please review the following plan we discussed and let me know if I can assist you in the future.   Screening recommendations/referrals: Colonoscopy: Done 01/10/2012 - Repeat 10 years - Due 2023 Mammogram: Done 06/29/2019 - ordered mobile mammogram today - call for appointment Bone Density: Done 07/14/2020 - Repeat 2 years - Due 2023 Recommended yearly ophthalmology/optometry visit for glaucoma screening and checkup Recommended yearly dental visit for hygiene and checkup  Vaccinations: Influenza vaccine: Done 07/09/2020 - Repeat annually - Due Fall 2022 Pneumococcal vaccine: Done 02/08/2014 & 06/28/2015 Tdap vaccine: Done 11/22/2010 - Repeat 10 years - Due now 2022 Shingles vaccine: Shingrix discussed. Please contact your pharmacy for coverage information.    Covid-19: Done 11/19/2019 & 12/18/2019  Advanced directives: Please bring a copy of your health care power of attorney and living will to the office to be added to your chart at your convenience.  Conditions/risks identified: Aim for 30 minutes of exercise or brisk walking each day, drink 6-8 glasses of water and eat lots of fruits and vegetables.  Next appointment: Follow up in one year for your annual wellness visit    Preventive Care 65 Years and Older, Female Preventive care refers to lifestyle choices and visits with your health care provider that can promote health and wellness. What does preventive care include?  A yearly physical exam. This is also called an annual well check.  Dental exams once or twice a year.  Routine eye exams. Ask your health care provider how often you should have your eyes checked.  Personal lifestyle choices, including:  Daily care of your teeth and gums.  Regular physical activity.  Eating a healthy diet.  Avoiding tobacco and drug use.  Limiting  alcohol use.  Practicing safe sex.  Taking low-dose aspirin every day.  Taking vitamin and mineral supplements as recommended by your health care provider. What happens during an annual well check? The services and screenings done by your health care provider during your annual well check will depend on your age, overall health, lifestyle risk factors, and family history of disease. Counseling  Your health care provider may ask you questions about your:  Alcohol use.  Tobacco use.  Drug use.  Emotional well-being.  Home and relationship well-being.  Sexual activity.  Eating habits.  History of falls.  Memory and ability to understand (cognition).  Work and work Statistician.  Reproductive health. Screening  You may have the following tests or measurements:  Height, weight, and BMI.  Blood pressure.  Lipid and cholesterol levels. These may be checked every 5 years, or more frequently if you are over 52 years old.  Skin check.  Lung cancer screening. You may have this screening every year starting at age 74 if you have a 30-pack-year history of smoking and currently smoke or have quit within the past 15 years.  Fecal occult blood test (FOBT) of the stool. You may have this test every year starting at age 31.  Flexible sigmoidoscopy or colonoscopy. You may have a sigmoidoscopy every 5 years or a colonoscopy every 10 years starting at age 4.  Hepatitis C blood test.  Hepatitis B blood test.  Sexually transmitted disease (STD) testing.  Diabetes screening. This is done by checking your blood sugar (glucose) after you have not eaten for a while (fasting). You may have this done every 1-3 years.  Bone density scan. This is done to screen for osteoporosis. You may have this done starting at age 67.  Mammogram. This may be done every 1-2 years. Talk to your health care provider about how often you should have regular mammograms. Talk with your health care provider  about your test results, treatment options, and if necessary, the need for more tests. Vaccines  Your health care provider may recommend certain vaccines, such as:  Influenza vaccine. This is recommended every year.  Tetanus, diphtheria, and acellular pertussis (Tdap, Td) vaccine. You may need a Td booster every 10 years.  Zoster vaccine. You may need this after age 82.  Pneumococcal 13-valent conjugate (PCV13) vaccine. One dose is recommended after age 34.  Pneumococcal polysaccharide (PPSV23) vaccine. One dose is recommended after age 74. Talk to your health care provider about which screenings and vaccines you need and how often you need them. This information is not intended to replace advice given to you by your health care provider. Make sure you discuss any questions you have with your health care provider. Document Released: 10/07/2015 Document Revised: 05/30/2016 Document Reviewed: 07/12/2015 Elsevier Interactive Patient Education  2017 [place] Prevention in the Home Falls can cause injuries. They can happen to people of all ages. There are many things you can do to make your home safe and to help prevent falls. What can I do on the outside of my home?  Regularly fix the edges of walkways and driveways and fix any cracks.  Remove anything that might make you trip as you walk through a door, such as a raised step or threshold.  Trim any bushes or trees on the path to your home.  Use bright outdoor lighting.  Clear any walking paths of anything that might make someone trip, such as rocks or tools.  Regularly check to see if handrails are loose or broken. Make sure that both sides of any steps have handrails.  Any raised decks and porches should have guardrails on the edges.  Have any leaves, snow, or ice cleared regularly.  Use sand or salt on walking paths during winter.  Clean up any spills in your garage right away. This includes oil or grease  spills. What can I do in the bathroom?  Use night lights.  Install grab bars by the toilet and in the tub and shower. Do not use towel bars as grab bars.  Use non-skid mats or decals in the tub or shower.  If you need to sit down in the shower, use a plastic, non-slip stool.  Keep the floor dry. Clean up any water that spills on the floor as soon as it happens.  Remove soap buildup in the tub or shower regularly.  Attach bath mats securely with double-sided non-slip rug tape.  Do not have throw rugs and other things on the floor that can make you trip. What can I do in the bedroom?  Use night lights.  Make sure that you have a light by your bed that is easy to reach.  Do not use any sheets or blankets that are too big for your bed. They should not hang down onto the floor.  Have a firm chair that has side arms. You can use this for support while you get dressed.  Do not have throw rugs and other things on the floor that can make you trip. What can I do in the kitchen?  Clean up any spills right away.  Avoid walking  on wet floors.  Keep items that you use a lot in easy-to-reach places.  If you need to reach something above you, use a strong step stool that has a grab bar.  Keep electrical cords out of the way.  Do not use floor polish or wax that makes floors slippery. If you must use wax, use non-skid floor wax.  Do not have throw rugs and other things on the floor that can make you trip. What can I do with my stairs?  Do not leave any items on the stairs.  Make sure that there are handrails on both sides of the stairs and use them. Fix handrails that are broken or loose. Make sure that handrails are as long as the stairways.  Check any carpeting to make sure that it is firmly attached to the stairs. Fix any carpet that is loose or worn.  Avoid having throw rugs at the top or bottom of the stairs. If you do have throw rugs, attach them to the floor with carpet  tape.  Make sure that you have a light switch at the top of the stairs and the bottom of the stairs. If you do not have them, ask someone to add them for you. What else can I do to help prevent falls?  Wear shoes that:  Do not have high heels.  Have rubber bottoms.  Are comfortable and fit you well.  Are closed at the toe. Do not wear sandals.  If you use a stepladder:  Make sure that it is fully opened. Do not climb a closed stepladder.  Make sure that both sides of the stepladder are locked into place.  Ask someone to hold it for you, if possible.  Clearly mark and make sure that you can see:  Any grab bars or handrails.  First and last steps.  Where the edge of each step is.  Use tools that help you move around (mobility aids) if they are needed. These include:  Canes.  Walkers.  Scooters.  Crutches.  Turn on the lights when you go into a dark area. Replace any light bulbs as soon as they burn out.  Set up your furniture so you have a clear path. Avoid moving your furniture around.  If any of your floors are uneven, fix them.  If there are any pets around you, be aware of where they are.  Review your medicines with your doctor. Some medicines can make you feel dizzy. This can increase your chance of falling. Ask your doctor what other things that you can do to help prevent falls. This information is not intended to replace advice given to you by your health care provider. Make sure you discuss any questions you have with your health care provider. Document Released: 07/07/2009 Document Revised: 02/16/2016 Document Reviewed: 10/15/2014 Elsevier Interactive Patient Education  2017 [employer].

## 2021-01-20 NOTE — Telephone Encounter (Signed)
Last office visit 01/09/21  Medication is a historical medication and is not listed on current med list from 01/09/21 visit.

## 2021-01-20 NOTE — Telephone Encounter (Signed)
Medication is not on current med list.  History list shows medication with note on 11/16/20 "discontinue on discharge".  I looked at hospital discharge summary from January and did not see mention of discontinuing and I looked through your office visit notes.  Just wanted to make sure this should not be refilled before I deny it.

## 2021-01-20 NOTE — Telephone Encounter (Signed)
Correct, this has been discontinued.

## 2021-01-23 NOTE — Telephone Encounter (Signed)
Pt is checking on fluid pills. She is about out and she is taking 20 mg

## 2021-01-23 NOTE — Telephone Encounter (Signed)
Pt aware refill was sent in to pharmacy

## 2021-01-30 ENCOUNTER — Other Ambulatory Visit: Payer: Self-pay

## 2021-01-30 ENCOUNTER — Encounter: Payer: Self-pay | Admitting: Nurse Practitioner

## 2021-01-30 ENCOUNTER — Ambulatory Visit (INDEPENDENT_AMBULATORY_CARE_PROVIDER_SITE_OTHER): Payer: PPO | Admitting: Nurse Practitioner

## 2021-01-30 VITALS — BP 136/82 | HR 97 | Temp 97.7°F | Resp 20 | Ht 66.0 in | Wt 158.0 lb

## 2021-01-30 DIAGNOSIS — N3000 Acute cystitis without hematuria: Secondary | ICD-10-CM

## 2021-01-30 DIAGNOSIS — R300 Dysuria: Secondary | ICD-10-CM

## 2021-01-30 LAB — URINALYSIS, COMPLETE
Bilirubin, UA: NEGATIVE
Ketones, UA: NEGATIVE
Nitrite, UA: NEGATIVE
Specific Gravity, UA: 1.015 (ref 1.005–1.030)
Urobilinogen, Ur: 0.2 mg/dL (ref 0.2–1.0)
pH, UA: 5 (ref 5.0–7.5)

## 2021-01-30 LAB — MICROSCOPIC EXAMINATION

## 2021-01-30 MED ORDER — CEPHALEXIN 500 MG PO CAPS: 500.0000 mg | ORAL_CAPSULE | Freq: Two times a day (BID) | ORAL | 0 refills | Status: DC

## 2021-01-30 NOTE — Progress Notes (Signed)
   Subjective:    Patient ID: [name], female    DOB: [birth year], 70 y.o.   MRN: [id number]   Chief Complaint: Dysuria   HPI Patient comesin today c/o dysuria. Started 2-3 days ago. Has had urgency and frequency and blood sugars have increased.  Review of Systems  Constitutional: Negative for diaphoresis.  Eyes: Negative for pain.  Respiratory: Negative for shortness of breath.   Cardiovascular: Negative for chest pain, palpitations and leg swelling.  Gastrointestinal: Negative for abdominal pain.  Endocrine: Negative for polydipsia.  Genitourinary: Positive for dysuria, frequency and urgency. Negative for pelvic pain.  Musculoskeletal: Negative for back pain.  Skin: Negative for rash.  Neurological: Negative for dizziness, weakness and headaches.  Hematological: Does not bruise/bleed easily.  All other systems reviewed and are negative.      Objective:   Physical Exam Vitals and nursing note reviewed.  Constitutional:      Appearance: Normal appearance.  Cardiovascular:     Rate and Rhythm: Normal rate and regular rhythm.     Heart sounds: Normal heart sounds.  Pulmonary:     Effort: Pulmonary effort is normal.     Breath sounds: Normal breath sounds.  Skin:    General: Skin is warm.  Neurological:     General: No focal deficit present.     Mental Status: She is alert and oriented to person, place, and time.  Psychiatric:        Mood and Affect: Mood normal.        Behavior: Behavior normal.    BP (!) 150/98   Pulse 97   Temp 97.7 F (36.5 C) (Temporal)   Resp 20   Ht 5\' 6"  (1.676 m)   Wt 158 lb (71.7 kg)   SpO2 99%   BMI 25.50 kg/m         Assessment & Plan:  [name] in today with chief complaint of Dysuria   1. Dysuria Take medication as prescribe Cotton underwear Take shower not bath Cranberry juice, yogurt Force fluids AZO over the counter X2 days Culture pending RTO prn  - Urinalysis, Complete    The  above assessment and management plan was discussed with the patient. The patient verbalized understanding of and has agreed to the management plan. Patient is aware to call the clinic if symptoms persist or worsen. Patient is aware when to return to the clinic for a follow-up visit. Patient educated on when it is appropriate to go to the emergency department.   Mary-Margaret Hassell Done, FNP

## 2021-01-30 NOTE — Patient Instructions (Signed)
Urinary Tract Infection, Adult A urinary tract infection (UTI) is an infection of any part of the urinary tract. The urinary tract includes:  The kidneys.  The ureters.  The bladder.  The urethra. These organs make, store, and get rid of pee (urine) in the body. What are the causes? This infection is caused by germs (bacteria) in your genital area. These germs grow and cause swelling (inflammation) of your urinary tract. What increases the risk? The following factors may make you more likely to develop this condition:  Using a small, thin tube (catheter) to drain pee.  Not being able to control when you pee or poop (incontinence).  Being female. If you are female, these things can increase the risk: ? Using these methods to prevent pregnancy:  A medicine that kills sperm (spermicide).  A device that blocks sperm (diaphragm). ? Having low levels of a female hormone (estrogen). ? Being pregnant. You are more likely to develop this condition if:  You have genes that add to your risk.  You are sexually active.  You take antibiotic medicines.  You have trouble peeing because of: ? A prostate that is bigger than normal, if you are female. ? A blockage in the part of your body that drains pee from the bladder. ? A kidney stone. ? A nerve condition that affects your bladder. ? Not getting enough to drink. ? Not peeing often enough.  You have other conditions, such as: ? Diabetes. ? A weak disease-fighting system (immune system). ? Sickle cell disease. ? Gout. ? Injury of the spine. What are the signs or symptoms? Symptoms of this condition include:  Needing to pee right away.  Peeing small amounts often.  Pain or burning when peeing.  Blood in the pee.  Pee that smells bad or not like normal.  Trouble peeing.  Pee that is cloudy.  Fluid coming from the vagina, if you are female.  Pain in the belly or lower back. Other symptoms include:  Vomiting.  Not  feeling hungry.  Feeling mixed up (confused). This may be the first symptom in older adults.  Being tired and grouchy (irritable).  A fever.  Watery poop (diarrhea). How is this treated?  Taking antibiotic medicine.  Taking other medicines.  Drinking enough water. In some cases, you may need to see a specialist. Follow these instructions at home: Medicines  Take over-the-counter and prescription medicines only as told by your doctor.  If you were prescribed an antibiotic medicine, take it as told by your doctor. Do not stop taking it even if you start to feel better. General instructions  Make sure you: ? Pee until your bladder is empty. ? Do not hold pee for a long time. ? Empty your bladder after sex. ? Wipe from front to back after peeing or pooping if you are a female. Use each tissue one time when you wipe.  Drink enough fluid to keep your pee pale yellow.  Keep all follow-up visits.   Contact a doctor if:  You do not get better after 1-2 days.  Your symptoms go away and then come back. Get help right away if:  You have very bad back pain.  You have very bad pain in your lower belly.  You have a fever.  You have chills.  You feeling like you will vomit or you vomit. Summary  A urinary tract infection (UTI) is an infection of any part of the urinary tract.  This condition is caused by   germs in your genital area.  There are many risk factors for a UTI.  Treatment includes antibiotic medicines.  Drink enough fluid to keep your pee pale yellow. This information is not intended to replace advice given to you by your health care provider. Make sure you discuss any questions you have with your health care provider. Document Revised: 04/22/2020 Document Reviewed: 04/22/2020 Elsevier Patient Education  2021 [employer]  

## 2021-02-03 LAB — URINE CULTURE

## 2021-02-22 ENCOUNTER — Ambulatory Visit: Payer: PPO | Admitting: Family Medicine

## 2021-02-23 ENCOUNTER — Ambulatory Visit (INDEPENDENT_AMBULATORY_CARE_PROVIDER_SITE_OTHER): Payer: PPO | Admitting: Family Medicine

## 2021-02-23 ENCOUNTER — Other Ambulatory Visit: Payer: Self-pay

## 2021-02-23 ENCOUNTER — Other Ambulatory Visit: Payer: Self-pay | Admitting: Family Medicine

## 2021-02-23 ENCOUNTER — Encounter: Payer: Self-pay | Admitting: Family Medicine

## 2021-02-23 VITALS — BP 133/61 | Temp 96.8°F | Ht 66.0 in | Wt 154.0 lb

## 2021-02-23 DIAGNOSIS — E1169 Type 2 diabetes mellitus with other specified complication: Secondary | ICD-10-CM | POA: Diagnosis not present

## 2021-02-23 DIAGNOSIS — N3000 Acute cystitis without hematuria: Secondary | ICD-10-CM

## 2021-02-23 DIAGNOSIS — I25119 Atherosclerotic heart disease of native coronary artery with unspecified angina pectoris: Secondary | ICD-10-CM

## 2021-02-23 DIAGNOSIS — I739 Peripheral vascular disease, unspecified: Secondary | ICD-10-CM | POA: Diagnosis not present

## 2021-02-23 DIAGNOSIS — G4701 Insomnia due to medical condition: Secondary | ICD-10-CM | POA: Diagnosis not present

## 2021-02-23 DIAGNOSIS — E785 Hyperlipidemia, unspecified: Secondary | ICD-10-CM

## 2021-02-23 DIAGNOSIS — I509 Heart failure, unspecified: Secondary | ICD-10-CM | POA: Diagnosis not present

## 2021-02-23 DIAGNOSIS — E1165 Type 2 diabetes mellitus with hyperglycemia: Secondary | ICD-10-CM | POA: Diagnosis not present

## 2021-02-23 DIAGNOSIS — F411 Generalized anxiety disorder: Secondary | ICD-10-CM | POA: Diagnosis not present

## 2021-02-23 DIAGNOSIS — I10 Essential (primary) hypertension: Secondary | ICD-10-CM

## 2021-02-23 DIAGNOSIS — L989 Disorder of the skin and subcutaneous tissue, unspecified: Secondary | ICD-10-CM

## 2021-02-23 DIAGNOSIS — F339 Major depressive disorder, recurrent, unspecified: Secondary | ICD-10-CM | POA: Diagnosis not present

## 2021-02-23 DIAGNOSIS — R300 Dysuria: Secondary | ICD-10-CM | POA: Diagnosis not present

## 2021-02-23 DIAGNOSIS — K219 Gastro-esophageal reflux disease without esophagitis: Secondary | ICD-10-CM | POA: Diagnosis not present

## 2021-02-23 LAB — MICROSCOPIC EXAMINATION
Epithelial Cells (non renal): NONE SEEN /hpf (ref 0–10)
WBC, UA: >30 /hpf — AB (ref 0–5)

## 2021-02-23 LAB — CMP14+EGFR
ALT: 10 IU/L (ref 0–32)
AST: 14 IU/L (ref 0–40)
Albumin/Globulin Ratio: 1.5 (ref 1.2–2.2)
Albumin: 4.3 g/dL (ref 3.7–4.7)
Alkaline Phosphatase: 67 IU/L (ref 44–121)
BUN/Creatinine Ratio: 9 — ABNORMAL LOW (ref 12–28)
BUN: 9 mg/dL (ref 8–27)
Bilirubin Total: 0.3 mg/dL (ref 0.0–1.2)
CO2: 23 mmol/L (ref 20–29)
Calcium: 9.4 mg/dL (ref 8.7–10.3)
Chloride: 102 mmol/L (ref 96–106)
Creatinine, Ser: 1.03 mg/dL — ABNORMAL HIGH (ref 0.57–1.00)
Globulin, Total: 2.9 g/dL (ref 1.5–4.5)
Glucose: 204 mg/dL — ABNORMAL HIGH (ref 65–99)
Potassium: 4.1 mmol/L (ref 3.5–5.2)
Sodium: 140 mmol/L (ref 134–144)
Total Protein: 7.2 g/dL (ref 6.0–8.5)
eGFR: 58 mL/min/{1.73_m2} — ABNORMAL LOW (ref >59)

## 2021-02-23 LAB — CBC WITH DIFFERENTIAL/PLATELET
Basophils Absolute: 0 10*3/uL (ref 0.0–0.2)
Basos: 0 %
EOS (ABSOLUTE): 0.2 10*3/uL (ref 0.0–0.4)
Eos: 2 %
Hematocrit: 36.4 % (ref 34.0–46.6)
Hemoglobin: 12 g/dL (ref 11.1–15.9)
Immature Grans (Abs): 0 10*3/uL (ref 0.0–0.1)
Immature Granulocytes: 0 %
Lymphocytes Absolute: 2.7 10*3/uL (ref 0.7–3.1)
Lymphs: 36 %
MCH: 29.9 pg (ref 26.6–33.0)
MCHC: 33 g/dL (ref 31.5–35.7)
MCV: 91 fL (ref 79–97)
Monocytes Absolute: 0.3 10*3/uL (ref 0.1–0.9)
Monocytes: 4 %
Neutrophils Absolute: 4.2 10*3/uL (ref 1.4–7.0)
Neutrophils: 58 %
Platelets: 165 10*3/uL (ref 150–450)
RBC: 4.02 x10E6/uL (ref 3.77–5.28)
RDW: 12.8 % (ref 11.7–15.4)
WBC: 7.5 10*3/uL (ref 3.4–10.8)

## 2021-02-23 LAB — URINALYSIS, ROUTINE W REFLEX MICROSCOPIC
Bilirubin, UA: NEGATIVE
Ketones, UA: NEGATIVE
Nitrite, UA: NEGATIVE
Protein,UA: NEGATIVE
Specific Gravity, UA: 1.01 (ref 1.005–1.030)
Urobilinogen, Ur: 0.2 mg/dL (ref 0.2–1.0)
pH, UA: 5 (ref 5.0–7.5)

## 2021-02-23 LAB — BAYER DCA HB A1C WAIVED: HB A1C (BAYER DCA - WAIVED): 8.8 % — ABNORMAL HIGH (ref <7.0)

## 2021-02-23 MED ORDER — SULFAMETHOXAZOLE-TRIMETHOPRIM 800-160 MG PO TABS: 1.0000 | ORAL_TABLET | Freq: Two times a day (BID) | ORAL | 0 refills | Status: AC

## 2021-02-23 MED ORDER — TRAZODONE HCL 50 MG PO TABS: 25.0000 mg | ORAL_TABLET | Freq: Every evening | ORAL | 0 refills | Status: DC | PRN

## 2021-02-23 NOTE — Progress Notes (Signed)
Established Patient Office Visit  Subjective:  Patient ID: [name], female    DOB: 1951-06-30  Age: 70 y.o. MRN: [id number]  CC:  Chief Complaint  Patient presents with  . Medical Management of Chronic Issues    HPI [name] presents for chronic follow up.  1. DM Patient denies foot ulcerations, paresthesia of the feet, vomiting and weight loss.  Current diabetic medications include novolog 35-40 units 3x a day, Toujeo 53 units daily, jardiance 10 mg daily, ozempic Compliant with meds - Yes  Current monitoring regimen: home blood tests - 3 times daily Home blood sugar records: fasting range: 110-150 Any episodes of hypoglycemia? yes - once a few weeks ago, was symptomatic and ate a chocolate bar to bring it up  Eye exam current (within one year): no, reminded to schedule Weight trend: stable  PNA Vaccine UTD?  Yes Tdap Vaccine UTD?  No, insurance won't cover Urine microalbumin UTD? Yes  Is She on ACE inhibitor or angiotensin II receptor blocker?  No Is She on statin? Yes crestor   2. HTN Complaint with meds - Yes Current Medications - norvasc, atenolol, lasix Pertinent ROS:  Headache - No Fatigue - No Visual Disturbances - No Chest pain - No Dyspnea - No Palpitations - No LE edema - No  3. Depression/anxiety On Zoloft.   Depression screen [facility] 2/9 02/23/2021 02/23/2021 01/30/2021  Decreased Interest 2 0 0  Down, Depressed, Hopeless 1 0 0  PHQ - 2 Score 3 0 0  Altered sleeping 3 - -  Tired, decreased energy 3 - -  Change in appetite 0 - -  Feeling bad or failure about yourself  0 - -  Trouble concentrating 0 - -  Moving slowly or fidgety/restless 1 - -  Suicidal thoughts 0 - -  PHQ-9 Score 10 - -  Difficult doing work/chores Somewhat difficult - -  Some recent data might be hidden   GAD 7 : Generalized Anxiety Score 02/23/2021 01/09/2021 11/02/2020 03/02/2020  Nervous, Anxious, on Edge _0 Control/stop worrying 1 0 3 3  Worry too  much - different things 1 0 3 0  Trouble relaxing 3 0 0 3  Restless 0 0 0 0  Easily annoyed or irritable 0 0 1 3  Afraid - awful might happen 0 0 0 1  Total GAD 7 Score _1 Anxiety Difficulty Somewhat difficult Somewhat difficult Not difficult at all Somewhat difficult   4. Insomnia Report poor sleep. She has difficulty falling asleep and staying asleep. She used to take benzodiazepines with good relief. She is not currently taking anything for sleep  5. Dysuria [name] reports dysuria x1 week with odor. She recently was treated for a UTI with Keflex. Denies fever, nausea, vomiting, or flank pain.   5. Skin problem She has noticed changes in moles on her back and one on her face over the last few months.   Past Medical History:  Diagnosis Date  . Anxiety   . CAD (coronary artery disease)    DES to circumflex 02/2007, BMS to LAD and PTCA diagonal 03/2007  . Carotid artery plaque    Mild  . Cataract   . Depression   . Diverticulitis, colon   . Elevated d-dimer 01/08/2014  . Essential hypertension, benign   . GERD (gastroesophageal reflux disease)   . H/O hiatal hernia   . HLD (hyperlipidemia)   . IDDM (insulin dependent diabetes mellitus)   .  Migraine    "used to have them really bad; don't have them anymore" (01/07/2014)  . MS (multiple sclerosis) ([place])    Not confirmed  . PAT (paroxysmal atrial tachycardia) ([place])   . Prolapse of uterus   . PVD (peripheral vascular disease) ([place])   . TIA (transient ischemic attack) 1980's    Past Surgical History:  Procedure Laterality Date  . ABDOMINAL HYSTERECTOMY  1986   ovaries remain - prolaspe uterus   . APPENDECTOMY  ~ 1970  . BREAST BIOPSY Right 1980's  . BREAST LUMPECTOMY Right 1980's   Dr. Charlynne Pander   . CARDIAC CATHETERIZATION  01/07/2014  . CHOLECYSTECTOMY  ?1987  . COLONOSCOPY  2002   Dr. Anwar--> Severe diverticular changes in the region of the sigmoid and descending colon with scattered diverticular changes throughout  the rest of the colon. No polyps, ulcerations. Despite numerous manipulations, the tip of the scope could not be tipped into the cecal area.  . COLONOSCOPY  01/10/2012   Procedure: COLONOSCOPY;  Surgeon: Daneil Dolin, MD;  Location: [facility];  Service: Endoscopy;  Laterality: N/A;  1:55  . CORONARY ANGIOPLASTY WITH STENT PLACEMENT  ~ 1997 X 2   "2 + 1"  . EYE SURGERY Bilateral 2014   cataract  . INTRAVASCULAR PRESSURE WIRE/FFR STUDY N/A 03/08/2017   Procedure: Intravascular Pressure Wire/FFR Study;  Surgeon: Nelva Bush, MD;  Location: [place] [facility];  Service: Cardiovascular;  Laterality: N/A;  . LEFT HEART CATH AND CORONARY ANGIOGRAPHY N/A 03/08/2017   Procedure: Left Heart Cath and Coronary Angiography;  Surgeon: Nelva Bush, MD;  Location: [place] [facility];  Service: Cardiovascular;  Laterality: N/A;  . LEFT HEART CATHETERIZATION WITH CORONARY ANGIOGRAM N/A 01/07/2014   Procedure: LEFT HEART CATHETERIZATION WITH CORONARY ANGIOGRAM;  Surgeon: Larey Dresser, MD;  Location: [facility] CATH LAB;  Service: Cardiovascular;  Laterality: N/A;    Family History  Problem Relation Age of Onset  . Heart attack Mother 74  . Diabetes Mother   . Hypertension Mother   . Heart attack Father 53  . Heart attack Brother 32       x 6  . Heart disease Brother   . Diabetes Brother   . Colon cancer Paternal Aunt        64s, died with brain anuerysm  . Crohn's disease Cousin        paternal  . Diabetes Sister   . GER disease Daughter   . Cervical cancer Daughter   . Diabetes Daughter     Social History   Socioeconomic History  . Marital status: Widowed    Spouse name: Not on file  . Number of children: 4  . Years of education: 51  . Highest education level: 11th grade  Occupational History  . Occupation: Disability    Employer: DISABLED  Tobacco Use  . Smoking status: Never Smoker  . Smokeless tobacco: Never Used  . Tobacco comment: spouse, 75 years - husband has quit 01/2011   Vaping Use  . Vaping Use: Never used  Substance and Sexual Activity  . Alcohol use: No  . Drug use: No  . Sexual activity: Not Currently  Other Topics Concern  . Not on file  Social History Narrative   Lives alone, one level, handicap accessible bathroom, her children all live nearby   Social Determinants of Health   Financial Resource Strain: Low Risk   . Difficulty of Paying Living Expenses: Not very hard  Food Insecurity: No  Food Insecurity  . Worried About Charity fundraiser in the Last Year: Never true  . Ran Out of Food in the Last Year: Never true  Transportation Needs: No Transportation Needs  . Lack of Transportation (Medical): No  . Lack of Transportation (Non-Medical): No  Physical Activity: Inactive  . Days of Exercise per Week: 0 days  . Minutes of Exercise per Session: 0 min  Stress: No Stress Concern Present  . Feeling of Stress : Only a little  Social Connections: Moderately Integrated  . Frequency of Communication with Friends and Family: More than three times a week  . Frequency of Social Gatherings with Friends and Family: More than three times a week  . Attends Religious Services: More than 4 times per year  . Active Member of Clubs or Organizations: Yes  . Attends Archivist Meetings: More than 4 times per year  . Marital Status: Widowed  Intimate Partner Violence: Not At Risk  . Fear of Current or Ex-Partner: No  . Emotionally Abused: No  . Physically Abused: No  . Sexually Abused: No    Outpatient Medications Prior to Visit  Medication Sig Dispense Refill  . amLODipine (NORVASC) 10 MG tablet Take 1 tablet (10 mg total) by mouth daily. 30 tablet 5  . aspirin 325 MG tablet Take 325 mg by mouth daily.    Marland Kitchen atenolol (TENORMIN) 50 MG tablet Take 1 tablet (50 mg total) by mouth 2 (two) times daily. 180 tablet 3  . calcium carbonate (OS-CAL) 600 MG TABS tablet Take 1 tablet (600 mg total) by mouth 2 (two) times daily with a meal. 180 tablet 3   . cephALEXin (KEFLEX) 500 MG capsule Take 1 capsule (500 mg total) by mouth 2 (two) times daily. 14 capsule 0  . Continuous Blood Gluc Sensor (FREESTYLE LIBRE 14 DAY SENSOR) MISC USE TO CHECH BLOOD GLUCOSE 1 SENSOR EVERY 14 DAYS 8 each 3  . empagliflozin (JARDIANCE) 10 MG TABS tablet Take 10 mg by mouth daily. 30 tablet 3  . esomeprazole (NEXIUM) 40 MG capsule Take 1 capsule (40 mg total) by mouth daily. 30 capsule 3  . furosemide (LASIX) 20 MG tablet TAKE ONE (1) TABLET EACH DAY 90 tablet 0  . insulin glargine, 1 Unit Dial, (TOUJEO SOLOSTAR) 300 UNIT/ML Solostar Pen Inject 53 Units into the skin daily. 9 mL 2  . meclizine (ANTIVERT) 25 MG tablet Take 1 tablet (25 mg total) by mouth 3 (three) times daily as needed for dizziness. 90 tablet 3  . nitroGLYCERIN (NITROSTAT) 0.4 MG SL tablet DISSOLVE 1 TABLET UNDER TONGUE FOR CHESTPAIN.MAY REPEAT EVERY 5 MINUTES FOR 3 DOSES.IF NO RELIEF CALL 911 OR GO TO ER 25 tablet 6  . NOVOLOG FLEXPEN 100 UNIT/ML FlexPen INJECT 35-40 UNITS SQ 3 TIMES DAILY WITH MEALS (Patient taking differently: 8-30 Units. Takes 8-30 units TID with meals depending on glucose) 30 mL 3  . ondansetron (ZOFRAN) 8 MG tablet Take 0.5 tablets (4 mg total) by mouth every 6 (six) hours as needed. 60 tablet 0  . rOPINIRole (REQUIP) 0.5 MG tablet Take 2 tablets (1 mg total) by mouth at bedtime. 180 tablet 2  . rosuvastatin (CRESTOR) 40 MG tablet TAKE ONE (1) TABLET EACH DAY (Patient taking differently: Take 40 mg by mouth daily.) 90 tablet 3  . Semaglutide,0.25 or 0.5MG/DOS, (OZEMPIC, 0.25 OR 0.5 MG/DOSE,) 2 MG/1.5ML SOPN Inject 0.375 mLs (0.5 mg total) into the skin once a week. 1.5 mL 11  . sertraline (ZOLOFT)  50 MG tablet Take 1 tablet (50 mg total) by mouth daily. 30 tablet 3  . vitamin B-12 (CYANOCOBALAMIN) 1000 MCG tablet Take 1,000 mcg by mouth daily.    . Vitamin D, Ergocalciferol, (DRISDOL) 1.25 MG (50000 UNIT) CAPS capsule TAKE 1 CAPSULE EVERY 7 DAYS 12 capsule 3  . isosorbide  mononitrate (IMDUR) 60 MG 24 hr tablet Take 1 tablet (60 mg total) by mouth daily. 30 tablet 0   No facility-administered medications prior to visit.    Allergies  Allergen Reactions  . Iohexol      Desc: pt had syncopal episode with nausea post IV CM late 1990's,  pt has had prednisone prep with heart caths x 2 without problem  kdean 04/16/07, Onset Date: [id number]   . Ticlid [Ticlopidine Hcl] Nausea And Vomiting  . Codeine Nausea And Vomiting and Palpitations    ROS Review of Systems  As per HPI.    Objective:    Physical Exam Vitals and nursing note reviewed.  Constitutional:      General: She is not in acute distress.    Appearance: Normal appearance. She is not ill-appearing, toxic-appearing or diaphoretic.  HENT:     Head: Normocephalic and atraumatic.  Eyes:     Conjunctiva/sclera: Conjunctivae normal.     Pupils: Pupils are equal, round, and reactive to light.  Cardiovascular:     Rate and Rhythm: Normal rate and regular rhythm.     Heart sounds: Normal heart sounds. No murmur heard.   Abdominal:     General: Bowel sounds are normal. There is no distension.     Palpations: Abdomen is soft.     Tenderness: There is no abdominal tenderness. There is no right CVA tenderness, left CVA tenderness, guarding or rebound.  Musculoskeletal:     Right lower leg: No edema.     Left lower leg: No edema.  Skin:    General: Skin is warm.  Neurological:     General: No focal deficit present.     Mental Status: She is alert and oriented to person, place, and time.  Psychiatric:        Mood and Affect: Mood normal.        Behavior: Behavior normal.     BP 133/61   Temp (!) 96.8 F (36 C)   Ht 5' 6" (1.676 m)   Wt 154 lb (69.9 kg)   BMI 24.86 kg/m  Wt Readings from Last 3 Encounters:  02/23/21 154 lb (69.9 kg)  01/30/21 158 lb (71.7 kg)  01/20/21 161 lb (73 kg)     Health Maintenance Due  Topic Date Due  . Zoster Vaccines- Shingrix (1 of 2) Never done  .  COVID-19 Vaccine (3 - Booster for Moderna series) 05/19/2020  . MAMMOGRAM  06/25/2020  . OPHTHALMOLOGY EXAM  09/27/2020  . URINE MICROALBUMIN  03/02/2021    There are no preventive care reminders to display for this patient.  Lab Results  Component Value Date   TSH 3.372 11/15/2020   Lab Results  Component Value Date   WBC 7.0 11/30/2020   HGB 10.9 (L) 11/30/2020   HCT 32.9 (L) 11/30/2020   MCV 87 11/30/2020   PLT 191 11/30/2020   Lab Results  Component Value Date   NA 136 11/30/2020   K 4.7 11/30/2020   CO2 21 11/30/2020   GLUCOSE 175 (H) 11/30/2020   BUN 14 11/30/2020   CREATININE 1.17 (H) 11/30/2020   BILITOT 0.2 11/30/2020   ALKPHOS 59  11/30/2020   AST 19 11/30/2020   ALT 11 11/30/2020   PROT 6.9 11/30/2020   ALBUMIN 4.1 11/30/2020   CALCIUM 10.0 11/30/2020   ANIONGAP 8 11/16/2020   EGFR 50 (L) 11/30/2020   Lab Results  Component Value Date   CHOL 146 11/30/2020   Lab Results  Component Value Date   HDL 41 11/30/2020   Lab Results  Component Value Date   LDLCALC 82 11/30/2020   Lab Results  Component Value Date   TRIG 131 11/30/2020   Lab Results  Component Value Date   CHOLHDL 3.6 11/30/2020   Lab Results  Component Value Date   HGBA1C 9.3 (H) 11/30/2020      Assessment & Plan:   Shonita was seen today for medical management of chronic issues.  Diagnoses and all orders for this visit:  DM type 2 with diabetic dyslipidemia (HCC) A1c 8.8 today. Not at goal of <8. Schedule appointment with Almyra Free next week to discuss medication options. She may need to come off of Januvia due to frequent UTIs. Reminded to get eye exam today. Labs pending.  -     CMP14+EGFR -     Bayer DCA Hb A1c Waived -     CBC with Differential/Platelet  Essential hypertension Well controlled on current regimen.  -     CMP14+EGFR -     CBC with Differential/Platelet  Generalized anxiety disorder/Depression, recurrent (HCC) On zoloft daily. Trazodone added.  Insomnia  due to medical condition Will try trazodone as below.  -     traZODone (DESYREL) 50 MG tablet; Take 0.5-1 tablets (25-50 mg total) by mouth at bedtime as needed for sleep.  Gastroesophageal reflux disease without esophagitis Well controlled on current regimen.  -     CMP14+EGFR -     CBC with Differential/Platelet  Congestive heart failure, unspecified HF chronicity, unspecified heart failure type ([place]) Labs pending. Referral to cardiology placed.  -     CMP14+EGFR -     CBC with Differential/Platelet -     Ambulatory referral to Cardiology  Coronary artery disease involving native coronary artery of native heart with angina pectoris ([place]) Labs pending, referral to cardiology -     CMP14+EGFR -     CBC with Differential/Platelet -     Ambulatory referral to Cardiology  Claudication [facility]) -     CMP14+EGFR -     CBC with Differential/Platelet -     Ambulatory referral to Cardiology  Dysuria Patient will leave urine sample for testing on her way out today.  -     Urinalysis, Routine w reflex microscopic -     Urine Culture  Skin lesion Changes in last few months. Referral to derm.  -     Ambulatory referral to Dermatology   Follow-up: Return in about 1 week (around 03/02/2021) for with Almyra Free for DM, 6 weeks with me for sleep.   The patient indicates understanding of these issues and agrees with the plan.  Gwenlyn Perking, FNP

## 2021-02-23 NOTE — Patient Instructions (Signed)

## 2021-03-01 LAB — URINE CULTURE

## 2021-03-02 ENCOUNTER — Other Ambulatory Visit: Payer: Self-pay | Admitting: Family Medicine

## 2021-03-02 DIAGNOSIS — L570 Actinic keratosis: Secondary | ICD-10-CM | POA: Diagnosis not present

## 2021-03-02 DIAGNOSIS — D485 Neoplasm of uncertain behavior of skin: Secondary | ICD-10-CM | POA: Diagnosis not present

## 2021-03-02 DIAGNOSIS — L820 Inflamed seborrheic keratosis: Secondary | ICD-10-CM | POA: Diagnosis not present

## 2021-03-03 DIAGNOSIS — H40033 Anatomical narrow angle, bilateral: Secondary | ICD-10-CM | POA: Diagnosis not present

## 2021-03-03 DIAGNOSIS — H2513 Age-related nuclear cataract, bilateral: Secondary | ICD-10-CM | POA: Diagnosis not present

## 2021-03-07 ENCOUNTER — Ambulatory Visit: Payer: PPO | Admitting: Pharmacist

## 2021-03-14 ENCOUNTER — Ambulatory Visit: Payer: PPO | Admitting: Pharmacist

## 2021-03-21 ENCOUNTER — Telehealth: Payer: Self-pay | Admitting: Pharmacist

## 2021-03-21 ENCOUNTER — Other Ambulatory Visit: Payer: Self-pay

## 2021-03-21 ENCOUNTER — Ambulatory Visit (INDEPENDENT_AMBULATORY_CARE_PROVIDER_SITE_OTHER): Payer: PPO | Admitting: Pharmacist

## 2021-03-21 DIAGNOSIS — G4701 Insomnia due to medical condition: Secondary | ICD-10-CM | POA: Diagnosis not present

## 2021-03-21 DIAGNOSIS — F339 Major depressive disorder, recurrent, unspecified: Secondary | ICD-10-CM | POA: Diagnosis not present

## 2021-03-21 DIAGNOSIS — F411 Generalized anxiety disorder: Secondary | ICD-10-CM

## 2021-03-21 MED ORDER — FREESTYLE LIBRE 2 SENSOR MISC: 11 refills | Status: DC

## 2021-03-21 MED ORDER — FREESTYLE LIBRE 2 READER DEVI: 0 refills | Status: DC

## 2021-03-21 MED ORDER — TRAZODONE HCL 50 MG PO TABS: 25.0000 mg | ORAL_TABLET | Freq: Every evening | ORAL | 0 refills | Status: DC | PRN

## 2021-03-21 NOTE — Telephone Encounter (Signed)
Patient requesting medication for anxiety.  Has not started trazodone for sleep, encouraged patient to start taking.  Maybe something like hydroxyzine for anxiety during the day? She has f/u with PCP On 04/06/21 re: sleep Thank you! Just passing along message

## 2021-03-21 NOTE — Progress Notes (Signed)
    03/21/2021 Name: Lashaya Kienitz MRN: [id number] DOB: [birth year]   S:  70 yoF Presents for diabetes evaluation, education, and management Patient was referred and last seen by Primary Care Provider on 02/23/21.  Insurance coverage/medication affordability: [employer] MEDICARE  Patient reports adherence with medications. Current diabetes medications include: TOUJEO, NOVOLOG, OZEMPIC Current hypertension medications include: NORVASC, ATENOLOL Goal 130/80 Current hyperlipidemia medications include: ROSUVASTATIN   Patient denies hypoglycemic events.  Discussed meal planning options and Plate method for healthy eating Avoid sugary drinks and desserts Incorporate balanced protein, non starchy veggies, 1 serving of carbohydrate with each meal Increase water intake Increase physical activity as able  Patient-reported exercise habits: N/A  O:  Lab Results  Component Value Date   HGBA1C 8.8 (H) 02/23/2021   Lipid Panel     Component Value Date/Time   CHOL 146 11/30/2020 1128   CHOL 107 02/05/2013 1002   TRIG 131 11/30/2020 1128   TRIG 149 11/30/2015 0947   TRIG 143 02/05/2013 1002   HDL 41 11/30/2020 1128   HDL 35 (L) 11/30/2015 0947   HDL 35 (L) 02/05/2013 1002   CHOLHDL 3.6 11/30/2020 1128   CHOLHDL 3.9 09/22/2017 0518   VLDL 26 09/22/2017 0518   LDLCALC 82 11/30/2020 1128   LDLCALC 195 (H) 04/26/2014 1029   LDLCALC 43 02/05/2013 1002   LDLDIRECT 93 04/06/2015 1038   Home fasting blood sugars: 200s  2 hour post-meal/random blood sugars: 300s.    Clinical Atherosclerotic Cardiovascular Disease (ASCVD): No   The 10-year ASCVD risk score Mikey Bussing DC Jr., et al., 2013) is: 28.8%   Values used to calculate the score:     Age: 70 years     Sex: Female     Is Non-Hispanic African American: No     Diabetic: Yes     Tobacco smoker: No     Systolic Blood Pressure: 998 mmHg     Is BP treated: Yes     HDL Cholesterol: 41 mg/dL     Total Cholesterol: 146 mg/dL     A/P:  Diabetes T2DM currently UNCONTROLLED.    -Stop jardiance due to recurrent UTIs  -Increase ozempic to 1mg   Denies personal and family history of Medullary thyroid cancer (MTC)  Patients gets meds at health dept, refill request for dose change placed in PCP box for signature, will then fax to novo nordisk patient assistance  -Submitted for patient to get Elenor Legato 2--phasing out 14-day libre system  -Continue insulin as prescribed   -Extensively discussed pathophysiology of diabetes, recommended lifestyle interventions, dietary effects on blood sugar control  -Counseled on s/sx of and management of hypoglycemia  -Next A1C anticipated 05/2021.  Written patient instructions provided.  Total time in face to face counseling 25 minutes.    Regina Eck, PharmD, BCPS Clinical Pharmacist, [place]  II Phone [phone]

## 2021-03-22 ENCOUNTER — Telehealth: Payer: Self-pay | Admitting: Family Medicine

## 2021-03-22 MED ORDER — HYDROXYZINE HCL 10 MG PO TABS: 10.0000 mg | ORAL_TABLET | Freq: Three times a day (TID) | ORAL | 0 refills | Status: DC | PRN

## 2021-03-22 NOTE — Telephone Encounter (Signed)
I have sent in Rx for hydroxyzine TID prn for anxiety.

## 2021-03-22 NOTE — Telephone Encounter (Signed)
Hydroxyzine was called in--separate note was routed to clinical pool to call patient  Will close this encounter

## 2021-03-22 NOTE — Telephone Encounter (Signed)
Pt calling to ask about the medicine that Almyra Free was supposed to talk to Jonelle Sidle about for her anxiety. Please call back and advise.

## 2021-03-28 ENCOUNTER — Encounter: Payer: Self-pay | Admitting: Pharmacist

## 2021-04-06 ENCOUNTER — Ambulatory Visit: Payer: PPO | Admitting: Family Medicine

## 2021-04-10 ENCOUNTER — Other Ambulatory Visit: Payer: Self-pay | Admitting: *Deleted

## 2021-04-10 MED ORDER — ATENOLOL 50 MG PO TABS: 50.0000 mg | ORAL_TABLET | Freq: Two times a day (BID) | ORAL | 3 refills | Status: DC

## 2021-04-20 ENCOUNTER — Telehealth: Payer: Self-pay

## 2021-05-03 ENCOUNTER — Telehealth: Payer: Self-pay | Admitting: Pharmacist

## 2021-05-03 NOTE — Telephone Encounter (Signed)
Patient called--4 month supply of ozempic '1mg'$  in fridge for pick up Updated medication list

## 2021-05-03 NOTE — Telephone Encounter (Signed)
Novolog 4 month patient assistance supply shipped to pcp office Patient to pick up

## 2021-05-08 NOTE — Progress Notes (Signed)
Cardiology Office Note   Date:  05/10/2021   ID:  [name], DOB [birth year], MRN [id number]  PCP:  Karen Perking, FNP  Cardiologist:   None Referring:  Karen Perking, FNP  Chief Complaint  Patient presents with   Weakness       History of Present Illness: [name] is a 70 y.o. female who presents for follow up of CAD.  I have not seen her since 2018.   She has a known history of stents to the LAD and PTCA to diagonal in 2008.    She had non obstructive CAD in 2015.  She was admitted with slurred speech in 2016 and had a negative work up with an unremarkable echo and mild carotid plaque.  POET (Plain Old Exercise Treadmill) was intermediate risk.   I last saw her in 2018.  She had atypical chest pain at that time.  She had a follow-up perfusion study that suggest some possible mild ischemia in the basal inferolateral, mid inferolateral, apical inferior and apical lateral locations.   She had a cath at that time and had patent stents as below.   Since I saw her she has done relatively well from a cardiovascular standpoint.  She did have COVID and had a couple brief hospitalizations for COVID-pneumonia and subsequent dehydration.  This was earlier this year in late last year and I was able to review labs.  They did check enzymes several times and they were never elevated.  She has had a lot of weakness since then.  She has been short of breath with usual activities such as household chores.  Has had some mild constant chest pressure.  She is finally slowly starting to get better.  She has a chronic chest discomfort typically at night and makes 1 nitroglycerin a week or so at night such as its been a stable pattern.  She denied having any increase in this.  She is not having any PND or orthopnea.  Is not having any palpitations, presyncope or syncope.  She said that when she initially got out of the hospital somewhere in early March she had a syncopal episode  because her legs were so weak and she was overall feeling frail.  However, she has not had any further syncope.  She does have leg weakness.  She has some balance issues.  She walks with a walker sometimes.   Past Medical History:  Diagnosis Date   Anxiety    CAD (coronary artery disease)    DES to circumflex 02/2007, BMS to LAD and PTCA diagonal 03/2007   Carotid artery plaque    Mild   Cataract    Depression    Diverticulitis, colon    Elevated d-dimer 01/08/2014   Essential hypertension, benign    GERD (gastroesophageal reflux disease)    H/O hiatal hernia    HLD (hyperlipidemia)    IDDM (insulin dependent diabetes mellitus)    Migraine    "used to have them really bad; don't have them anymore" (01/07/2014)   MS (multiple sclerosis) ([place])    Not confirmed   PAT (paroxysmal atrial tachycardia) (HCC)    Prolapse of uterus    PVD (peripheral vascular disease) (HCC)    TIA (transient ischemic attack) 1980's    Past Surgical History:  Procedure Laterality Date   ABDOMINAL HYSTERECTOMY  1986   ovaries remain - prolaspe uterus    APPENDECTOMY  ~ [place] Right  1980's   BREAST LUMPECTOMY Right 1980's   Dr. Charlynne Pander    CARDIAC CATHETERIZATION  01/07/2014   CHOLECYSTECTOMY  ?1987   COLONOSCOPY  2002   Dr. Anwar--> Severe diverticular changes in the region of the sigmoid and descending colon with scattered diverticular changes throughout the rest of the colon. No polyps, ulcerations. Despite numerous manipulations, the tip of the scope could not be tipped into the cecal area.   COLONOSCOPY  01/10/2012   Procedure: COLONOSCOPY;  Surgeon: Karen Dolin, MD;  Location: [facility];  Service: Endoscopy;  Laterality: N/A;  1:55   CORONARY ANGIOPLASTY WITH STENT PLACEMENT  ~ 1997 X 2   "2 + 1"   EYE SURGERY Bilateral 2014   cataract   INTRAVASCULAR PRESSURE WIRE/FFR STUDY N/A 03/08/2017   Procedure: Intravascular Pressure Wire/FFR Study;  Surgeon: Nelva Bush, MD;   Location: [place] [facility];  Service: Cardiovascular;  Laterality: N/A;   LEFT HEART CATH AND CORONARY ANGIOGRAPHY N/A 03/08/2017   Procedure: Left Heart Cath and Coronary Angiography;  Surgeon: Nelva Bush, MD;  Location: [place] [facility];  Service: Cardiovascular;  Laterality: N/A;   LEFT HEART CATHETERIZATION WITH CORONARY ANGIOGRAM N/A 01/07/2014   Procedure: LEFT HEART CATHETERIZATION WITH CORONARY ANGIOGRAM;  Surgeon: Larey Dresser, MD;  Location: [facility];  Service: Cardiovascular;  Laterality: N/A;     Current Outpatient Medications  Medication Sig Dispense Refill   amLODipine (NORVASC) 10 MG tablet Take 1 tablet (10 mg total) by mouth daily. 30 tablet 5   aspirin 325 MG tablet Take 325 mg by mouth daily.     atenolol (TENORMIN) 50 MG tablet Take 1 tablet (50 mg total) by mouth 2 (two) times daily. 180 tablet 3   calcium carbonate (OS-CAL) 600 MG TABS tablet Take 1 tablet (600 mg total) by mouth 2 (two) times daily with a meal. 180 tablet 3   citalopram (CELEXA) 40 MG tablet Take 40 mg by mouth daily.     esomeprazole (NEXIUM) 40 MG capsule Take 1 capsule (40 mg total) by mouth daily. 30 capsule 3   furosemide (LASIX) 20 MG tablet TAKE ONE (1) TABLET EACH DAY 90 tablet 0   hydrOXYzine (ATARAX/VISTARIL) 10 MG tablet Take 1 tablet (10 mg total) by mouth 3 (three) times daily as needed. 30 tablet 0   insulin glargine, 1 Unit Dial, (TOUJEO SOLOSTAR) 300 UNIT/ML Solostar Pen Inject 53 Units into the skin daily. 9 mL 2   isosorbide mononitrate (IMDUR) 60 MG 24 hr tablet TAKE ONE (1) TABLET EACH DAY 30 tablet 5   meclizine (ANTIVERT) 25 MG tablet Take 1 tablet (25 mg total) by mouth 3 (three) times daily as needed for dizziness. 90 tablet 3   nitroGLYCERIN (NITROSTAT) 0.4 MG SL tablet DISSOLVE 1 TABLET UNDER TONGUE FOR CHESTPAIN.MAY REPEAT EVERY 5 MINUTES FOR 3 DOSES.IF NO RELIEF CALL 911 OR GO TO ER 25 tablet 6   NOVOLOG FLEXPEN 100 UNIT/ML FlexPen INJECT 35-40 UNITS SQ 3 TIMES  DAILY WITH MEALS (Patient taking differently: 8-30 Units. Takes 8-30 units TID with meals depending on glucose) 30 mL 3   ondansetron (ZOFRAN) 8 MG tablet Take 0.5 tablets (4 mg total) by mouth every 6 (six) hours as needed. 60 tablet 0   rOPINIRole (REQUIP) 0.5 MG tablet Take 2 tablets (1 mg total) by mouth at bedtime. 180 tablet 2   rosuvastatin (CRESTOR) 40 MG tablet TAKE ONE (1) TABLET EACH DAY (Patient taking differently: Take 40 mg by  mouth daily.) 90 tablet 3   Semaglutide, 1 MG/DOSE, (OZEMPIC, 1 MG/DOSE,) 2 MG/1.5ML SOPN Inject 1 mg into the skin once a week.     traZODone (DESYREL) 50 MG tablet Take 0.5-1 tablets (25-50 mg total) by mouth at bedtime as needed for sleep. 90 tablet 0   vitamin B-12 (CYANOCOBALAMIN) 1000 MCG tablet Take 1,000 mcg by mouth daily.     Vitamin D, Ergocalciferol, (DRISDOL) 1.25 MG (50000 UNIT) CAPS capsule TAKE 1 CAPSULE EVERY 7 DAYS 12 capsule 3   Continuous Blood Gluc Receiver (FREESTYLE LIBRE 2 READER) DEVI USE TO TEST BLOOD SUGAR 6 TIMES DAILY. Dx:E11.65 1 each 0   Continuous Blood Gluc Sensor (FREESTYLE LIBRE 2 SENSOR) MISC USE TO TEST BLOOD SUGAR 6 TIMES DAILY. Dx:E11.65 2 each 11   sertraline (ZOLOFT) 50 MG tablet Take 1 tablet (50 mg total) by mouth daily. (Patient not taking: Reported on 05/10/2021) 30 tablet 3   No current facility-administered medications for this visit.    Allergies:   Iohexol, Ticlid [ticlopidine hcl], and Codeine    Social History:  The patient  reports that she has never smoked. She has never used smokeless tobacco. She reports that she does not drink alcohol and does not use drugs.   Family History:  The patient's family history includes Cervical cancer in her daughter; Colon cancer in her paternal aunt; Crohn's disease in her cousin; Diabetes in her brother, daughter, mother, and sister; GER disease in her daughter; Heart attack (age of onset: 18) in her brother; Heart attack (age of onset: 61) in her mother; Heart attack (age  of onset: 48) in her father; Heart disease in her brother; Hypertension in her mother.    ROS:  Please see the history of present illness.   Otherwise, review of systems are positive for dizziness and occasional headaches.   All other systems are reviewed and negative.    PHYSICAL EXAM: VS:  BP (!) 152/80   Pulse 88   Ht '5\' 6"'$  (1.676 m)   Wt 156 lb (70.8 kg)   BMI 25.18 kg/m  , BMI Body mass index is 25.18 kg/m. GENERAL:  Well appearing HEENT:  Pupils equal round and reactive, fundi not visualized, oral mucosa unremarkable NECK:  No jugular venous distention, waveform within normal limits, carotid upstroke brisk and symmetric, no bruits, no thyromegaly LYMPHATICS:  No cervical, inguinal adenopathy LUNGS:  Clear to auscultation bilaterally BACK:  No CVA tenderness CHEST:  Unremarkable HEART:  PMI not displaced or sustained,S1 and S2 within normal limits, no S3, no S4, no clicks, no rubs, no murmurs ABD:  Flat, positive bowel sounds normal in frequency in pitch, no bruits, no rebound, no guarding, no midline pulsatile mass, no hepatomegaly, no splenomegaly EXT:  2 plus pulses throughout, no edema, no cyanosis no clubbing SKIN:  No rashes no nodules NEURO:  Cranial nerves II through XII grossly intact, motor grossly intact throughout PSYCH:  Cognitively intact, oriented to person place and time    EKG:  EKG is ordered today. The ekg ordered today demonstrates sinus rhythm, rate 88, low voltage in the limb leads, poor anterior R wave progression, no acute ST-T wave changes.  No change from previous.  The QTC is reduced compared to previous   Recent Labs: 10/10/2020: B Natriuretic Peptide 70.0 11/15/2020: TSH 3.372 11/16/2020: Magnesium 1.9 02/23/2021: ALT 10; BUN 9; Creatinine, Ser 1.03; Hemoglobin 12.0; Platelets 165; Potassium 4.1; Sodium 140    Lipid Panel    Component Value Date/Time  CHOL 146 11/30/2020 1128   CHOL 107 02/05/2013 1002   TRIG 131 11/30/2020 1128   TRIG 149  11/30/2015 0947   TRIG 143 02/05/2013 1002   HDL 41 11/30/2020 1128   HDL 35 (L) 11/30/2015 0947   HDL 35 (L) 02/05/2013 1002   CHOLHDL 3.6 11/30/2020 1128   CHOLHDL 3.9 09/22/2017 0518   VLDL 26 09/22/2017 0518   LDLCALC 82 11/30/2020 1128   LDLCALC 195 (H) 04/26/2014 1029   LDLCALC 43 02/05/2013 1002   LDLDIRECT 93 04/06/2015 1038      Wt Readings from Last 3 Encounters:  05/10/21 156 lb (70.8 kg)  02/23/21 154 lb (69.9 kg)  01/30/21 158 lb (71.7 kg)    CATH 2018  Diagnostic Dominance: Right Intervention   Other studies Reviewed: Additional studies/ records that were reviewed today include: Hospital records. Review of the above records demonstrates:  Please see elsewhere in the note.     ASSESSMENT AND PLAN:   CAD:   The patient has no new sypmtoms.  No further cardiovascular testing is indicated.  We will continue with aggressive risk reduction and meds as listed.     HTN:  Her blood pressure is elevated but when I look back at previous readings this is unusual.  She is going to keep a blood pressure diary.  She may need med titration.     CAROTID STENOSIS:   This was mild no change in therapy.    HYPERLIPIDEMIA:     Her lipids are 82 with an HDL of 41 earlier this year.  She will continue the meds as listed.  At target.  She will continue the meds as listed.    DM:  A1C was 8.8 and it is down from 10.  She is having her meds adjusted actively.        Current medicines are reviewed at length with the patient today.  The patient does not have concerns regarding medicines.  The following changes have been made:  no change  Labs/ tests ordered today include:   Orders Placed This Encounter  Procedures   EKG 12-Lead      Disposition:   FU with me in one year.      Signed, Minus Breeding, MD  05/10/2021 12:08 PM    [facility]

## 2021-05-10 ENCOUNTER — Encounter: Payer: Self-pay | Admitting: Cardiology

## 2021-05-10 ENCOUNTER — Ambulatory Visit: Payer: PPO | Admitting: Cardiology

## 2021-05-10 ENCOUNTER — Other Ambulatory Visit: Payer: Self-pay

## 2021-05-10 VITALS — BP 152/80 | HR 88 | Ht 66.0 in | Wt 156.0 lb

## 2021-05-10 DIAGNOSIS — I10 Essential (primary) hypertension: Secondary | ICD-10-CM

## 2021-05-10 DIAGNOSIS — E118 Type 2 diabetes mellitus with unspecified complications: Secondary | ICD-10-CM

## 2021-05-10 DIAGNOSIS — E785 Hyperlipidemia, unspecified: Secondary | ICD-10-CM

## 2021-05-10 DIAGNOSIS — I25119 Atherosclerotic heart disease of native coronary artery with unspecified angina pectoris: Secondary | ICD-10-CM

## 2021-05-10 NOTE — Patient Instructions (Signed)
Medication Instructions:  The current medical regimen is effective;  continue present plan and medications.  *If you need a refill on your cardiac medications before your next appointment, please call your pharmacy*  Follow-Up: At [facility], you and your health needs are our priority.  As part of our continuing mission to provide you with exceptional heart care, we have created designated Provider Care Teams.  These Care Teams include your primary Cardiologist (physician) and Advanced Practice Providers (APPs -  Physician Assistants and Nurse Practitioners) who all work together to provide you with the care you need, when you need it.  We recommend signing up for the patient portal called "MyChart".  Sign up information is provided on this After Visit Summary.  MyChart is used to connect with patients for Virtual Visits (Telemedicine).  Patients are able to view lab/test results, encounter notes, upcoming appointments, etc.  Non-urgent messages can be sent to your provider as well.   To learn more about what you can do with MyChart, go to [email or web address].    Your next appointment:   1 year(s)  The format for your next appointment:   In Person  Provider:   Minus Breeding, MD   Please keep a blood pressure diary and return it to the [place] office or you may sent it through [place] if you prefer.

## 2021-05-11 ENCOUNTER — Telehealth: Payer: Self-pay

## 2021-05-16 ENCOUNTER — Telehealth: Payer: Self-pay | Admitting: Family Medicine

## 2021-05-16 ENCOUNTER — Encounter: Payer: Self-pay | Admitting: Nurse Practitioner

## 2021-05-16 ENCOUNTER — Ambulatory Visit (INDEPENDENT_AMBULATORY_CARE_PROVIDER_SITE_OTHER): Payer: PPO | Admitting: Nurse Practitioner

## 2021-05-16 DIAGNOSIS — N3000 Acute cystitis without hematuria: Secondary | ICD-10-CM | POA: Diagnosis not present

## 2021-05-16 MED ORDER — SULFAMETHOXAZOLE-TRIMETHOPRIM 800-160 MG PO TABS: 1.0000 | ORAL_TABLET | Freq: Two times a day (BID) | ORAL | 0 refills | Status: DC

## 2021-05-16 NOTE — Progress Notes (Signed)
   Virtual Visit  Note Due to COVID-19 pandemic this visit was conducted virtually. This visit type was conducted due to national recommendations for restrictions regarding the COVID-19 Pandemic (e.g. social distancing, sheltering in place) in an effort to limit this patient's exposure and mitigate transmission in our community. All issues noted in this document were discussed and addressed.  A physical exam was not performed with this format.  I connected with [name] on 05/16/21 at 8:20 by telephone and verified that I am speaking with the correct person using two identifiers. [name] is currently located at home and no one is currently with her during visit. The provider, Mary-Margaret Hassell Done, FNP is located in their office at time of visit.  I discussed the limitations, risks, security and privacy concerns of performing an evaluation and management service by telephone and the availability of in person appointments. I also discussed with the patient that there may be a patient responsible charge related to this service. The patient expressed understanding and agreed to proceed.   History and Present Illness:   Chief Complaint: Urinary Tract Infection   HPI Patient does telephone visit c/o of urinary urgency and frequency. Now her urine smell. This will be the 5th time she has had UTI since she had covid.     Review of Systems  Constitutional:  Negative for chills and fever.  HENT: Negative.    Respiratory: Negative.    Cardiovascular: Negative.   Genitourinary:  Positive for dysuria, frequency and urgency. Negative for flank pain and hematuria.  Musculoskeletal:  Negative for back pain.    Observations/Objective: Alert and oriented- answers all questions appropriately No distress    Assessment and Plan: [name] in today with chief complaint of Urinary Tract Infection   1. Acute cystitis without hematuria Take medication as  prescribe Cotton underwear Take shower not bath Cranberry juice, yogurt Force fluids AZO over the counter X2 days RTO prn  Meds ordered this encounter  Medications   sulfamethoxazole-trimethoprim (BACTRIM DS) 800-160 MG tablet    Sig: Take 1 tablet by mouth 2 (two) times daily.    Dispense:  20 tablet    Refill:  0    Order Specific Question:   Supervising Provider    Answer:   Caryl Pina A [id number]       Follow Up Instructions: prn    I discussed the assessment and treatment plan with the patient. The patient was provided an opportunity to ask questions and all were answered. The patient agreed with the plan and demonstrated an understanding of the instructions.   The patient was advised to call back or seek an in-person evaluation if the symptoms worsen or if the condition fails to improve as anticipated.  The above assessment and management plan was discussed with the patient. The patient verbalized understanding of and has agreed to the management plan. Patient is aware to call the clinic if symptoms persist or worsen. Patient is aware when to return to the clinic for a follow-up visit. Patient educated on when it is appropriate to go to the emergency department.   Time call ended:  8:33  I provided 13 minutes of  non face-to-face time during this encounter.    Mary-Margaret Hassell Done, FNP

## 2021-05-19 ENCOUNTER — Other Ambulatory Visit: Payer: Self-pay | Admitting: *Deleted

## 2021-05-19 ENCOUNTER — Telehealth: Payer: Self-pay | Admitting: Family Medicine

## 2021-05-19 MED ORDER — FREESTYLE LIBRE 2 READER DEVI: 0 refills | Status: DC

## 2021-05-19 NOTE — Telephone Encounter (Signed)
Pt aware to pick up the reader from the pharmacy and voiced understanding per Julies note.

## 2021-05-19 NOTE — Telephone Encounter (Signed)
I reviewed fill history A new rx was sent in for the [facility] 2 sensor & reader Patient was previously on  libre 14 day system They are not compatible   Make sure she picked up the [facility] 2 reader and 2 sensor--these work with each other I do not have any readers as samples

## 2021-05-19 NOTE — Telephone Encounter (Signed)
Pt says that her reader is not working now since refill on last [place] = the drug store in [place]

## 2021-05-31 ENCOUNTER — Telehealth: Payer: PPO

## 2021-06-09 ENCOUNTER — Other Ambulatory Visit: Payer: Self-pay

## 2021-06-09 ENCOUNTER — Ambulatory Visit (INDEPENDENT_AMBULATORY_CARE_PROVIDER_SITE_OTHER): Payer: PPO

## 2021-06-09 DIAGNOSIS — Z23 Encounter for immunization: Secondary | ICD-10-CM | POA: Diagnosis not present

## 2021-06-16 DIAGNOSIS — Z1231 Encounter for screening mammogram for malignant neoplasm of breast: Secondary | ICD-10-CM | POA: Diagnosis not present

## 2021-07-04 ENCOUNTER — Telehealth: Payer: PPO

## 2021-07-27 ENCOUNTER — Telehealth: Payer: PPO

## 2021-08-07 DIAGNOSIS — I959 Hypotension, unspecified: Secondary | ICD-10-CM | POA: Diagnosis not present

## 2021-08-07 DIAGNOSIS — Z8679 Personal history of other diseases of the circulatory system: Secondary | ICD-10-CM | POA: Diagnosis not present

## 2021-08-07 DIAGNOSIS — Z87440 Personal history of urinary (tract) infections: Secondary | ICD-10-CM | POA: Diagnosis not present

## 2021-08-07 DIAGNOSIS — L299 Pruritus, unspecified: Secondary | ICD-10-CM | POA: Diagnosis not present

## 2021-08-07 DIAGNOSIS — Z09 Encounter for follow-up examination after completed treatment for conditions other than malignant neoplasm: Secondary | ICD-10-CM | POA: Diagnosis not present

## 2021-08-07 DIAGNOSIS — R55 Syncope and collapse: Secondary | ICD-10-CM | POA: Diagnosis not present

## 2021-08-09 ENCOUNTER — Other Ambulatory Visit: Payer: Self-pay

## 2021-08-09 ENCOUNTER — Ambulatory Visit (INDEPENDENT_AMBULATORY_CARE_PROVIDER_SITE_OTHER): Payer: PPO | Admitting: Family Medicine

## 2021-08-09 ENCOUNTER — Encounter: Payer: Self-pay | Admitting: Family Medicine

## 2021-08-09 VITALS — BP 151/76 | HR 87 | Temp 98.4°F | Ht 66.0 in | Wt 156.0 lb

## 2021-08-09 DIAGNOSIS — I152 Hypertension secondary to endocrine disorders: Secondary | ICD-10-CM

## 2021-08-09 DIAGNOSIS — E1169 Type 2 diabetes mellitus with other specified complication: Secondary | ICD-10-CM | POA: Diagnosis not present

## 2021-08-09 DIAGNOSIS — I509 Heart failure, unspecified: Secondary | ICD-10-CM | POA: Diagnosis not present

## 2021-08-09 DIAGNOSIS — E1159 Type 2 diabetes mellitus with other circulatory complications: Secondary | ICD-10-CM | POA: Diagnosis not present

## 2021-08-09 DIAGNOSIS — R319 Hematuria, unspecified: Secondary | ICD-10-CM | POA: Diagnosis not present

## 2021-08-09 DIAGNOSIS — K219 Gastro-esophageal reflux disease without esophagitis: Secondary | ICD-10-CM

## 2021-08-09 DIAGNOSIS — F339 Major depressive disorder, recurrent, unspecified: Secondary | ICD-10-CM

## 2021-08-09 DIAGNOSIS — F411 Generalized anxiety disorder: Secondary | ICD-10-CM

## 2021-08-09 DIAGNOSIS — E785 Hyperlipidemia, unspecified: Secondary | ICD-10-CM | POA: Diagnosis not present

## 2021-08-09 DIAGNOSIS — I10 Essential (primary) hypertension: Secondary | ICD-10-CM | POA: Diagnosis not present

## 2021-08-09 LAB — CBC WITH DIFFERENTIAL/PLATELET
Basophils Absolute: 0 10*3/uL (ref 0.0–0.2)
Basos: 0 %
EOS (ABSOLUTE): 0.1 10*3/uL (ref 0.0–0.4)
Eos: 2 %
Hematocrit: 38.2 % (ref 34.0–46.6)
Hemoglobin: 13.1 g/dL (ref 11.1–15.9)
Immature Grans (Abs): 0 10*3/uL (ref 0.0–0.1)
Immature Granulocytes: 0 %
Lymphocytes Absolute: 3 10*3/uL (ref 0.7–3.1)
Lymphs: 38 %
MCH: 30.3 pg (ref 26.6–33.0)
MCHC: 34.3 g/dL (ref 31.5–35.7)
MCV: 88 fL (ref 79–97)
Monocytes Absolute: 0.3 10*3/uL (ref 0.1–0.9)
Monocytes: 4 %
Neutrophils Absolute: 4.5 10*3/uL (ref 1.4–7.0)
Neutrophils: 56 %
Platelets: 184 10*3/uL (ref 150–450)
RBC: 4.32 x10E6/uL (ref 3.77–5.28)
RDW: 12.8 % (ref 11.7–15.4)
WBC: 8 10*3/uL (ref 3.4–10.8)

## 2021-08-09 LAB — URINALYSIS, ROUTINE W REFLEX MICROSCOPIC
Bilirubin, UA: NEGATIVE
Ketones, UA: NEGATIVE
Nitrite, UA: NEGATIVE
Protein,UA: NEGATIVE
Specific Gravity, UA: 1.01 (ref 1.005–1.030)
Urobilinogen, Ur: 1 mg/dL (ref 0.2–1.0)
pH, UA: 5.5 (ref 5.0–7.5)

## 2021-08-09 LAB — CMP14+EGFR
ALT: 5 IU/L (ref 0–32)
AST: 10 IU/L (ref 0–40)
Albumin/Globulin Ratio: 1.6 (ref 1.2–2.2)
Albumin: 4.3 g/dL (ref 3.7–4.7)
Alkaline Phosphatase: 59 IU/L (ref 44–121)
BUN/Creatinine Ratio: 7 — ABNORMAL LOW (ref 12–28)
BUN: 7 mg/dL — ABNORMAL LOW (ref 8–27)
Bilirubin Total: 0.3 mg/dL (ref 0.0–1.2)
CO2: 25 mmol/L (ref 20–29)
Calcium: 9.5 mg/dL (ref 8.7–10.3)
Chloride: 102 mmol/L (ref 96–106)
Creatinine, Ser: 1.03 mg/dL — ABNORMAL HIGH (ref 0.57–1.00)
Globulin, Total: 2.7 g/dL (ref 1.5–4.5)
Glucose: 193 mg/dL — ABNORMAL HIGH (ref 70–99)
Potassium: 4.3 mmol/L (ref 3.5–5.2)
Sodium: 140 mmol/L (ref 134–144)
Total Protein: 7 g/dL (ref 6.0–8.5)
eGFR: 58 mL/min/{1.73_m2} — ABNORMAL LOW (ref >59)

## 2021-08-09 LAB — MICROSCOPIC EXAMINATION

## 2021-08-09 LAB — LIPID PANEL
Chol/HDL Ratio: 3.3 ratio (ref 0.0–4.4)
Cholesterol, Total: 130 mg/dL (ref 100–199)
HDL: 39 mg/dL — ABNORMAL LOW (ref >39)
LDL Chol Calc (NIH): 73 mg/dL (ref 0–99)
Triglycerides: 93 mg/dL (ref 0–149)
VLDL Cholesterol Cal: 18 mg/dL (ref 5–40)

## 2021-08-09 LAB — BAYER DCA HB A1C WAIVED: HB A1C (BAYER DCA - WAIVED): 7.7 % — ABNORMAL HIGH (ref 4.8–5.6)

## 2021-08-09 MED ORDER — TOUJEO SOLOSTAR 300 UNIT/ML ~~LOC~~ SOPN: 54.0000 [IU] | PEN_INJECTOR | Freq: Every day | SUBCUTANEOUS | 2 refills | Status: DC

## 2021-08-09 NOTE — Progress Notes (Signed)
Established Patient Office Visit  Subjective:  Patient ID: [name], female    DOB: 1951-02-16  Age: 70 y.o. MRN: [id number]  CC:  Chief Complaint  Patient presents with   Medical Management of Chronic Issues    HPI [name] presents for chronic follow up.   1. DM Patient denies foot ulcerations, paresthesia of the feet, vomiting and weight loss.   Current diabetic medications include toujeo 52 units daily, novolog 8-30 units with meals TID, ozempic 1 mg Compliant with meds - Yes   Current monitoring regimen: home blood tests -  Home blood sugar records:  Any episodes of hypoglycemia?    Eye exam current (within one year): yes Weight trend: stable   Is She on ACE inhibitor or angiotensin II receptor blocker?  No Is She on statin? Yes crestor     2. HTN Complaint with meds - Yes Current Medications - norvasc, atenolol, lasix, imdur Checking BP at home: yes, reports 130s/80s Pertinent ROS:  Headache - No Fatigue - No Visual Disturbances - No Chest pain - No Dyspnea - No Palpitations - No LE edema - No   3. Hematuria She reports that [facility] from [facility] told her that she had blood in her urine but it did not look like she had a UTI. She denies fever, flank pain, visible blood, or urinary symtpoms.   4. Depression/anxiety Depression screen [facility] 2/9 08/09/2021 02/23/2021 02/23/2021  Decreased Interest 0 2 0  Down, Depressed, Hopeless 0 1 0  PHQ - 2 Score 0 3 0  Altered sleeping 0 3 -  Tired, decreased energy 2 3 -  Change in appetite 3 0 -  Feeling bad or failure about yourself  0 0 -  Trouble concentrating 0 0 -  Moving slowly or fidgety/restless 0 1 -  Suicidal thoughts 0 0 -  PHQ-9 Score 5 10 -  Difficult doing work/chores Somewhat difficult Somewhat difficult -  Some recent data might be hidden   GAD 7 : Generalized Anxiety Score 08/09/2021 02/23/2021 01/09/2021 11/02/2020  Nervous, Anxious, on Edge 1 3 1 3   Control/stop worrying  0 1 0 3  Worry too much - different things 0 1 0 3  Trouble relaxing 0 3 0 0  Restless 0 0 0 0  Easily annoyed or irritable 0 0 0 1  Afraid - awful might happen 0 0 0 0  Total GAD 7 Score 1 8 1 10   Anxiety Difficulty Not difficult at all Somewhat difficult Somewhat difficult Not difficult at all     Past Medical History:  Diagnosis Date   Anxiety    CAD (coronary artery disease)    DES to circumflex 02/2007, BMS to LAD and PTCA diagonal 03/2007   Carotid artery plaque    Mild   Cataract    Depression    Diverticulitis, colon    Elevated d-dimer 01/08/2014   Essential hypertension, benign    GERD (gastroesophageal reflux disease)    H/O hiatal hernia    HLD (hyperlipidemia)    IDDM (insulin dependent diabetes mellitus)    Migraine    "used to have them really bad; don't have them anymore" (01/07/2014)   MS (multiple sclerosis) (HCC)    Not confirmed   PAT (paroxysmal atrial tachycardia) (HCC)    Prolapse of uterus    PVD (peripheral vascular disease) (HCC)    TIA (transient ischemic attack) 1980's    Past Surgical History:  Procedure Laterality Date  ABDOMINAL HYSTERECTOMY  1986   ovaries remain - prolaspe uterus    APPENDECTOMY  ~ 1970   BREAST BIOPSY Right 1980's   BREAST LUMPECTOMY Right 1980's   Dr. Charlynne Pander    CARDIAC CATHETERIZATION  01/07/2014   CHOLECYSTECTOMY  ?1987   COLONOSCOPY  2002   Dr. Anwar--> Severe diverticular changes in the region of the sigmoid and descending colon with scattered diverticular changes throughout the rest of the colon. No polyps, ulcerations. Despite numerous manipulations, the tip of the scope could not be tipped into the cecal area.   COLONOSCOPY  01/10/2012   Procedure: COLONOSCOPY;  Surgeon: Daneil Dolin, MD;  [facility];  Service: Endoscopy;  Laterality: N/A;  1:55   CORONARY ANGIOPLASTY WITH STENT PLACEMENT  ~ 1997 X 2   "2 + 1"   EYE SURGERY Bilateral 2014   cataract   INTRAVASCULAR PRESSURE WIRE/FFR STUDY N/A  03/08/2017   Procedure: Intravascular Pressure Wire/FFR Study;  Surgeon: Nelva Bush, MD;  Location: [facility];  Service: Cardiovascular;  Laterality: N/A;   LEFT HEART CATH AND CORONARY ANGIOGRAPHY N/A 03/08/2017   Procedure: Left Heart Cath and Coronary Angiography;  Surgeon: Nelva Bush, MD;  Location: [facility];  Service: Cardiovascular;  Laterality: N/A;   LEFT HEART CATHETERIZATION WITH CORONARY ANGIOGRAM N/A 01/07/2014   Procedure: LEFT HEART CATHETERIZATION WITH CORONARY ANGIOGRAM;  Surgeon: Larey Dresser, MD;  Location: [facility] CATH [facility];  Service: Cardiovascular;  Laterality: N/A;    Family History  Problem Relation Age of Onset   Heart attack Mother 77   Diabetes Mother    Hypertension Mother    Heart attack Father 41   Heart attack Brother 72       x 6   Heart disease Brother    Diabetes Brother    Colon cancer Paternal Aunt        80s, died with brain anuerysm   Crohn's disease Cousin        paternal   Diabetes Sister    GER disease Daughter    Cervical cancer Daughter    Diabetes Daughter     Social History   Socioeconomic History   Marital status: Widowed    Spouse name: Not on file   Number of children: 4   Years of education: 30   Highest education level: 11th grade  Occupational History   Occupation: Disability    Employer: DISABLED  Tobacco Use   Smoking status: Never   Smokeless tobacco: Never   Tobacco comments:    spouse, 16 years - husband has quit 01/2011  Vaping Use   Vaping Use: Never used  Substance and Sexual Activity   Alcohol use: No   Drug use: No   Sexual activity: Not Currently  Other Topics Concern   Not on file  Social History Narrative   Lives alone, one level, handicap accessible bathroom, her children all live nearby   Social Determinants of Health   Financial Resource Strain: Low Risk    Difficulty of Paying Living Expenses: Not very hard  Food Insecurity: No Food Insecurity   Worried About Paediatric nurse in the Last Year: Never true   [place] in the Last Year: Never true  Transportation Needs: No Transportation Needs   Lack of Transportation (Medical): No   Lack of Transportation (Non-Medical): No  Physical Activity: Inactive   Days of Exercise per Week: 0 days   Minutes of Exercise  per Session: 0 min  Stress: No Stress Concern Present   Feeling of Stress : Only a little  Social Connections: Moderately Integrated   Frequency of Communication with Friends and Family: More than three times a week   Frequency of Social Gatherings with Friends and Family: More than three times a week   Attends Religious Services: More than 4 times per year   Active Member of [employer] or Organizations: Yes   Attends Archivist Meetings: More than 4 times per year   Marital Status: Widowed  Human resources officer Violence: Not At Risk   Fear of Current or Ex-Partner: No   Emotionally Abused: No   Physically Abused: No   Sexually Abused: No    Outpatient Medications Prior to Visit  Medication Sig Dispense Refill   amLODipine (NORVASC) 10 MG tablet Take 1 tablet (10 mg total) by mouth daily. 30 tablet 5   aspirin 325 MG tablet Take 325 mg by mouth daily.     atenolol (TENORMIN) 50 MG tablet Take 1 tablet (50 mg total) by mouth 2 (two) times daily. 180 tablet 3   calcium carbonate (OS-CAL) 600 MG TABS tablet Take 1 tablet (600 mg total) by mouth 2 (two) times daily with a meal. 180 tablet 3   citalopram (CELEXA) 40 MG tablet Take 40 mg by mouth daily.     Continuous Blood Gluc Receiver (FREESTYLE LIBRE 2 READER) DEVI USE TO TEST BLOOD SUGAR 6 TIMES DAILY. Dx:E11.65 1 each 0   Continuous Blood Gluc Sensor (FREESTYLE LIBRE 2 SENSOR) MISC USE TO TEST BLOOD SUGAR 6 TIMES DAILY. Dx:E11.65 2 each 11   esomeprazole (NEXIUM) 40 MG capsule Take 1 capsule (40 mg total) by mouth daily. 30 capsule 3   furosemide (LASIX) 20 MG tablet TAKE ONE (1) TABLET EACH DAY 90 tablet 0   hydrOXYzine  (ATARAX/VISTARIL) 10 MG tablet Take 1 tablet (10 mg total) by mouth 3 (three) times daily as needed. 30 tablet 0   insulin glargine, 1 Unit Dial, (TOUJEO SOLOSTAR) 300 UNIT/ML Solostar Pen Inject 53 Units into the skin daily. 9 mL 2   isosorbide mononitrate (IMDUR) 60 MG 24 hr tablet TAKE ONE (1) TABLET EACH DAY 30 tablet 5   meclizine (ANTIVERT) 25 MG tablet Take 1 tablet (25 mg total) by mouth 3 (three) times daily as needed for dizziness. 90 tablet 3   nitroGLYCERIN (NITROSTAT) 0.4 MG SL tablet DISSOLVE 1 TABLET UNDER TONGUE FOR CHESTPAIN.MAY REPEAT EVERY 5 MINUTES FOR 3 DOSES.IF NO RELIEF CALL 911 OR GO TO ER 25 tablet 6   NOVOLOG FLEXPEN 100 UNIT/ML FlexPen INJECT 35-40 UNITS SQ 3 TIMES DAILY WITH MEALS (Patient taking differently: 8-30 Units. Takes 8-30 units TID with meals depending on glucose) 30 mL 3   ondansetron (ZOFRAN) 8 MG tablet Take 0.5 tablets (4 mg total) by mouth every 6 (six) hours as needed. 60 tablet 0   rOPINIRole (REQUIP) 0.5 MG tablet Take 2 tablets (1 mg total) by mouth at bedtime. 180 tablet 2   rosuvastatin (CRESTOR) 40 MG tablet TAKE ONE (1) TABLET EACH DAY (Patient taking differently: Take 40 mg by mouth daily.) 90 tablet 3   Semaglutide, 1 MG/DOSE, (OZEMPIC, 1 MG/DOSE,) 2 MG/1.5ML SOPN Inject 1 mg into the skin once a week.     sertraline (ZOLOFT) 50 MG tablet Take 1 tablet (50 mg total) by mouth daily. 30 tablet 3   traZODone (DESYREL) 50 MG tablet Take 0.5-1 tablets (25-50 mg total) by mouth at bedtime  as needed for sleep. 90 tablet 0   vitamin B-12 (CYANOCOBALAMIN) 1000 MCG tablet Take 1,000 mcg by mouth daily.     Vitamin D, Ergocalciferol, (DRISDOL) 1.25 MG (50000 UNIT) CAPS capsule TAKE 1 CAPSULE EVERY 7 DAYS 12 capsule 3   sulfamethoxazole-trimethoprim (BACTRIM DS) 800-160 MG tablet Take 1 tablet by mouth 2 (two) times daily. 20 tablet 0   No facility-administered medications prior to visit.    Allergies  Allergen Reactions   Iohexol      Desc: pt had  syncopal episode with nausea post IV CM late 1990's,  pt has had prednisone prep with heart caths x 2 without problem  kdean 04/16/07, Onset Date: [id number]    Ticlid [Ticlopidine Hcl] Nausea And Vomiting   Codeine Nausea And Vomiting and Palpitations    ROS Review of Systems As per HPI.   Objective:    Physical Exam Vitals and nursing note reviewed.  Constitutional:      General: She is not in acute distress.    Appearance: She is not ill-appearing, toxic-appearing or diaphoretic.  Neck:     Thyroid: No thyroid mass, thyromegaly or thyroid tenderness.     Vascular: No carotid bruit.  Cardiovascular:     Rate and Rhythm: Normal rate and regular rhythm.     Heart sounds: Normal heart sounds. No murmur heard. Pulmonary:     Effort: Pulmonary effort is normal. No respiratory distress.     Breath sounds: Normal breath sounds.  Musculoskeletal:     Cervical back: Neck supple. No tenderness.     Right lower leg: No edema.     Left lower leg: No edema.  Skin:    General: Skin is warm.  Neurological:     General: No focal deficit present.     Mental Status: She is alert and oriented to person, place, and time.     Gait: Gait normal.  Psychiatric:        Mood and Affect: Mood normal.        Behavior: Behavior normal.    BP (!) 151/76   Pulse 87   Temp 98.4 F (36.9 C) (Temporal)   Ht 5' 6"  (1.676 m)   Wt 156 lb (70.8 kg)   SpO2 96%   BMI 25.18 kg/m  Wt Readings from Last 3 Encounters:  08/09/21 156 lb (70.8 kg)  05/10/21 156 lb (70.8 kg)  02/23/21 154 lb (69.9 kg)   Urine dipstick shows positive for RBC's, positive for glucose, and positive for leukocytes.  Micro exam: 0-5 WBC's per HPF, 0-2 RBC's per HPF, and few+ bacteria.   Health Maintenance Due  Topic Date Due   Zoster Vaccines- Shingrix (1 of 2) Never done   COVID-19 Vaccine (3 - Moderna risk series) 01/15/2020   OPHTHALMOLOGY EXAM  09/27/2020   URINE MICROALBUMIN  03/02/2021    There are no preventive  care reminders to display for this patient.  Lab Results  Component Value Date   TSH 3.372 11/15/2020   Lab Results  Component Value Date   WBC 7.5 02/23/2021   HGB 12.0 02/23/2021   HCT 36.4 02/23/2021   MCV 91 02/23/2021   PLT 165 02/23/2021   Lab Results  Component Value Date   NA 140 02/23/2021   K 4.1 02/23/2021   CO2 23 02/23/2021   GLUCOSE 204 (H) 02/23/2021   BUN 9 02/23/2021   CREATININE 1.03 (H) 02/23/2021   BILITOT 0.3 02/23/2021   ALKPHOS 67 02/23/2021   AST  14 02/23/2021   ALT 10 02/23/2021   PROT 7.2 02/23/2021   ALBUMIN 4.3 02/23/2021   CALCIUM 9.4 02/23/2021   ANIONGAP 8 11/16/2020   EGFR 58 (L) 02/23/2021   Lab Results  Component Value Date   CHOL 146 11/30/2020   Lab Results  Component Value Date   HDL 41 11/30/2020   Lab Results  Component Value Date   LDLCALC 82 11/30/2020   Lab Results  Component Value Date   TRIG 131 11/30/2020   Lab Results  Component Value Date   CHOLHDL 3.6 11/30/2020   Lab Results  Component Value Date   HGBA1C 8.8 (H) 02/23/2021      Assessment & Plan:   [name] was seen today for medical management of chronic issues.  Diagnoses and all orders for this visit:  DM type 2 with diabetic dyslipidemia (HCC) A1c 7.7 today, improved from previous at 8.8. Increase toujeo to 54 units. Labs and microalbumin pending. Follow up with Almyra Free in 4 weeks for patient assistance and DM follow up.  -     Bayer DCA Hb A1c Waived -     Lipid panel -     insulin glargine, 1 Unit Dial, (TOUJEO SOLOSTAR) 300 UNIT/ML Solostar Pen; Inject 54 Units into the skin daily. -     Microalbumin / creatinine urine ratio; Future  Hypertension associated with diabetes ([place]) Elevated at visit today, however reports well controlled at home. Asymptomatic. Will continue current regimen. Monitor BP at home and notify if BP is persistently elevated.  -     CBC with Differential/Platelet  Congestive heart failure, unspecified HF chronicity,  unspecified heart failure type [facility]) Managed by cardiology. On BB and lasix., -     CMP14+EGFR  Gastroesophageal reflux disease without esophagitis Well controlled on current regimen.   Generalized anxiety disorder Well controlled on current regimen. On Celexa  Depression, recurrent ([place]) Well controlled on current regimen.  On celexa.   Hematuria, unspecified type Normal on dip. Culture pending.  -     Urinalysis, Routine w reflex microscopic; Future -     Urine Culture; Future -     Microscopic Examination   Follow-up: Return in about 3 weeks (around 08/30/2021) for with Almyra Free for DM and patient assistance, 3 month chronic follow up.   The patient indicates understanding of these issues and agrees with the plan.   Gwenlyn Perking, FNP

## 2021-08-09 NOTE — Patient Instructions (Signed)

## 2021-08-10 LAB — MICROALBUMIN / CREATININE URINE RATIO
Creatinine, Urine: 85 mg/dL
Microalb/Creat Ratio: 9 mg/g creat (ref 0–29)
Microalbumin, Urine: 8 ug/mL

## 2021-08-13 LAB — URINE CULTURE

## 2021-08-14 DIAGNOSIS — N183 Chronic kidney disease, stage 3 unspecified: Secondary | ICD-10-CM | POA: Diagnosis not present

## 2021-08-14 DIAGNOSIS — Z9181 History of falling: Secondary | ICD-10-CM | POA: Diagnosis not present

## 2021-08-14 DIAGNOSIS — I129 Hypertensive chronic kidney disease with stage 1 through stage 4 chronic kidney disease, or unspecified chronic kidney disease: Secondary | ICD-10-CM | POA: Diagnosis not present

## 2021-08-14 DIAGNOSIS — Z515 Encounter for palliative care: Secondary | ICD-10-CM | POA: Diagnosis not present

## 2021-08-14 DIAGNOSIS — E1122 Type 2 diabetes mellitus with diabetic chronic kidney disease: Secondary | ICD-10-CM | POA: Diagnosis not present

## 2021-08-14 DIAGNOSIS — Z794 Long term (current) use of insulin: Secondary | ICD-10-CM | POA: Diagnosis not present

## 2021-08-14 DIAGNOSIS — Z8679 Personal history of other diseases of the circulatory system: Secondary | ICD-10-CM | POA: Diagnosis not present

## 2021-08-14 DIAGNOSIS — E1159 Type 2 diabetes mellitus with other circulatory complications: Secondary | ICD-10-CM | POA: Diagnosis not present

## 2021-08-14 DIAGNOSIS — Z09 Encounter for follow-up examination after completed treatment for conditions other than malignant neoplasm: Secondary | ICD-10-CM | POA: Diagnosis not present

## 2021-08-14 DIAGNOSIS — Z8709 Personal history of other diseases of the respiratory system: Secondary | ICD-10-CM | POA: Diagnosis not present

## 2021-08-14 DIAGNOSIS — Z872 Personal history of diseases of the skin and subcutaneous tissue: Secondary | ICD-10-CM | POA: Diagnosis not present

## 2021-08-14 DIAGNOSIS — F331 Major depressive disorder, recurrent, moderate: Secondary | ICD-10-CM | POA: Diagnosis not present

## 2021-08-14 DIAGNOSIS — F419 Anxiety disorder, unspecified: Secondary | ICD-10-CM | POA: Diagnosis not present

## 2021-08-14 DIAGNOSIS — I25118 Atherosclerotic heart disease of native coronary artery with other forms of angina pectoris: Secondary | ICD-10-CM | POA: Diagnosis not present

## 2021-09-06 ENCOUNTER — Ambulatory Visit (INDEPENDENT_AMBULATORY_CARE_PROVIDER_SITE_OTHER): Payer: PPO | Admitting: Pharmacist

## 2021-09-06 DIAGNOSIS — I10 Essential (primary) hypertension: Secondary | ICD-10-CM

## 2021-09-06 DIAGNOSIS — E1169 Type 2 diabetes mellitus with other specified complication: Secondary | ICD-10-CM

## 2021-09-07 ENCOUNTER — Telehealth: Payer: Self-pay | Admitting: Family Medicine

## 2021-09-07 NOTE — Telephone Encounter (Signed)
RETURNED call with patient

## 2021-09-07 NOTE — Progress Notes (Signed)
Chronic Care Management Pharmacy Note  09/06/2021 Name:  [name] MRN:  [id number] DOB:  [birth year]  Summary: T2DM  Recommendations/Changes made from today's visit:  Diabetes: New goal. Uncontrolled; current treatment:toujeo 54 units, novolog 10-15 units 3x w/ meals, ozempic 75m;  A1c 7.7% (improved); GFR 58 Patient going low overnight in the 60s Need to see libre data-patient to bring in Eating supper at 4pm-recommended protein/fiber snack before bed (I.e. half PB sandwhich) Will re enroll for patient assistance with novo nordisk 2023 (tresiba, novolog, ozempic) reports hypoglycemic/hyperglycemic symptoms Discussed meal planning options and Plate method for healthy eating Avoid sugary drinks and desserts Incorporate balanced protein, non starchy veggies, 1 serving of carbohydrate with each meal Increase water intake Increase physical activity as able Current exercise: n/a Recommended decrease toujeo to 50 units, meal time ~10 units depending on carb REVIEWED MEDICATIONS PURPOSE & SIDE EFFECTS, HYPERTENSION/BP CONTROLLED Will re enroll for patient assistance with novo nordisk 2023 (tresiba, novolog, ozempic)   Patient Goals/Self-Care Activities patient will:  - take medications as prescribed as evidenced by patient report and record review check glucose using libre 2 CGM, document, and provide at future appointments collaborate with provider on medication access solutions   Plan: F/U IN 1-2 MONTHS  Subjective: Karen Gaineyis an 70y.o. year old female who is a primary patient of MGwenlyn Perking FNP.  The CCM team was consulted for assistance with disease management and care coordination needs.    Engaged with patient by telephone for follow up visit in response to provider referral for pharmacy case management and/or care coordination services.   Consent to Services:  The patient was given information about Chronic Care Management services,  agreed to services, and gave verbal consent prior to initiation of services.  Please see initial visit note for detailed documentation.   Patient Care Team: MGwenlyn Perking FNP as PCP - General (Family Medicine) RDaneil Dolin MD (Gastroenterology) HMinus Breeding MD (Cardiology) HIlean China RN as Registered Nurse  Objective:  Lab Results  Component Value Date   CREATININE 1.03 (H) 08/09/2021   CREATININE 1.03 (H) 02/23/2021   CREATININE 1.17 (H) 11/30/2020    Lab Results  Component Value Date   HGBA1C 7.7 (H) 08/09/2021   Last diabetic Eye exam:  Lab Results  Component Value Date/Time   HMDIABEYEEXA No Retinopathy 11/29/2015 12:00 AM    Last diabetic Foot exam: No results found for: HMDIABFOOTEX      Component Value Date/Time   CHOL 130 08/09/2021 0959   CHOL 107 02/05/2013 1002   TRIG 93 08/09/2021 0959   TRIG 149 11/30/2015 0947   TRIG 143 02/05/2013 1002   HDL 39 (L) 08/09/2021 0959   HDL 35 (L) 11/30/2015 0947   HDL 35 (L) 02/05/2013 1002   CHOLHDL 3.3 08/09/2021 0959   CHOLHDL 3.9 09/22/2017 0518   VLDL 26 09/22/2017 0518   LDLCALC 73 08/09/2021 0959   LDLCALC 195 (H) 04/26/2014 1029   LDLCALC 43 02/05/2013 1002   LDLDIRECT 93 04/06/2015 1038    Hepatic Function Latest Ref Rng & Units 08/09/2021 02/23/2021 11/30/2020  Total Protein 6.0 - 8.5 g/dL 7.0 7.2 6.9  Albumin 3.7 - 4.7 g/dL 4.3 4.3 4.1  AST 0 - 40 IU/L 10 14 19   ALT 0 - 32 IU/L 5 10 11   Alk Phosphatase 44 - 121 IU/L 59 67 59  Total Bilirubin 0.0 - 1.2 mg/dL 0.3 0.3 0.2  Bilirubin, Direct 0.00 -  0.40 mg/dL - - -    Lab Results  Component Value Date/Time   TSH 3.372 11/15/2020 08:14 PM   TSH 1.950 04/23/2018 09:43 AM   TSH 1.550 04/06/2015 10:38 AM   FREET4 1.16 04/17/2007 06:30 AM    CBC Latest Ref Rng & Units 08/09/2021 02/23/2021 11/30/2020  WBC 3.4 - 10.8 x10E3/uL 8.0 7.5 7.0  Hemoglobin 11.1 - 15.9 g/dL 13.1 12.0 10.9(L)  Hematocrit 34.0 - 46.6 % 38.2 36.4 32.9(L)  Platelets 150  - 450 x10E3/uL 184 165 191    Lab Results  Component Value Date/Time   VD25OH 90.28 11/15/2020 08:14 PM   VD25OH 45.4 02/20/2019 11:52 AM    Clinical ASCVD: No  The 10-year ASCVD risk score (Arnett DK, et al., 2019) is: 34.9%   Values used to calculate the score:     Age: 70 years     Sex: Female     Is Non-Hispanic African American: No     Diabetic: Yes     Tobacco smoker: No     Systolic Blood Pressure: 728 mmHg     Is BP treated: Yes     HDL Cholesterol: 39 mg/dL     Total Cholesterol: 130 mg/dL    Other: (CHADS2VASc if Afib, PHQ9 if depression, MMRC or CAT for COPD, ACT, DEXA)  Social History   Tobacco Use  Smoking Status Never  Smokeless Tobacco Never  Tobacco Comments   spouse, 89 years - husband has quit 01/2011   BP Readings from Last 3 Encounters:  08/09/21 (!) 151/76  05/10/21 (!) 152/80  02/23/21 133/61   Pulse Readings from Last 3 Encounters:  08/09/21 87  05/10/21 88  01/30/21 97   Wt Readings from Last 3 Encounters:  08/09/21 156 lb (70.8 kg)  05/10/21 156 lb (70.8 kg)  02/23/21 154 lb (69.9 kg)    Assessment: Review of patient past medical history, allergies, medications, health status, including review of consultants reports, laboratory and other test data, was performed as part of comprehensive evaluation and provision of chronic care management services.   SDOH:  (Social Determinants of Health) assessments and interventions performed:    CCM Care Plan  Allergies  Allergen Reactions   Iohexol      Desc: pt had syncopal episode with nausea post IV CM late 1990's,  pt has had prednisone prep with heart caths x 2 without problem  kdean 04/16/07, Onset Date: 20601561    Ticlid [Ticlopidine Hcl] Nausea And Vomiting   Jardiance [Empagliflozin] Other (See Comments)    Recurrent UTIs   Metformin And Related Diarrhea   Codeine Nausea And Vomiting and Palpitations    Medications Reviewed Today     Reviewed by Lavera Guise, [facility] (Pharmacist)  on 09/13/21 at 1445  Med List Status: <None>   Medication Order Taking? Sig Documenting Provider Last Dose Status Informant  amLODipine (NORVASC) 10 MG tablet [id number] No Take 1 tablet (10 mg total) by mouth daily. Roxan Hockey, MD Taking Active Self  aspirin 325 MG tablet [id number] No Take 325 mg by mouth daily. [provider] Taking Active Self  atenolol (TENORMIN) 50 MG tablet [id number] No Take 1 tablet (50 mg total) by mouth 2 (two) times daily. Gwenlyn Perking, FNP Taking Active   calcium carbonate (OS-CAL) 600 MG TABS tablet [id number] No Take 1 tablet (600 mg total) by mouth 2 (two) times daily with a meal. Gwenlyn Perking, FNP Taking Active Self  citalopram (CELEXA) 40 MG tablet [id number] No Take  40 mg by mouth daily. [provider] Taking Active   Continuous Blood Gluc Receiver (FREESTYLE LIBRE 2 READER) DEVI [id number] No USE TO TEST BLOOD SUGAR 6 TIMES DAILY. Dx:E11.65 Gwenlyn Perking, FNP Taking Active   Continuous Blood Gluc Sensor (FREESTYLE LIBRE 2 SENSOR) MISC [id number] No USE TO TEST BLOOD SUGAR 6 TIMES DAILY. Dx:E11.65 Gwenlyn Perking, FNP Taking Active   esomeprazole (NEXIUM) 40 MG capsule [id number] No Take 1 capsule (40 mg total) by mouth daily. Loman [place], FNP Taking Active Self  furosemide (LASIX) 20 MG tablet [id number] No TAKE ONE (1) TABLET EACH DAY Gwenlyn Perking, FNP Taking Active   hydrOXYzine (ATARAX/VISTARIL) 10 MG tablet [id number] No Take 1 tablet (10 mg total) by mouth 3 (three) times daily as needed. Gwenlyn Perking, FNP Taking Active   insulin glargine, 1 Unit Dial, (TOUJEO SOLOSTAR) 300 UNIT/ML Solostar Pen [id number]  Inject 54 Units into the skin daily. Gwenlyn Perking, FNP  Active   isosorbide mononitrate (IMDUR) 60 MG 24 hr tablet [id number] No TAKE ONE (1) TABLET EACH DAY Gwenlyn Perking, FNP Taking Active   meclizine (ANTIVERT) 25 MG tablet [id number] No Take 1 tablet (25 mg total) by mouth 3 (three) times daily as  needed for dizziness. Dettinger, Fransisca Kaufmann, MD Taking Active Self  nitroGLYCERIN (NITROSTAT) 0.4 MG SL tablet [id number] No DISSOLVE 1 TABLET UNDER TONGUE FOR CHESTPAIN.MAY REPEAT EVERY 5 MINUTES FOR 3 DOSES.IF NO RELIEF CALL 911 OR GO TO ER Gwenlyn Perking, FNP Taking Active Self  NOVOLOG FLEXPEN 100 UNIT/ML FlexPen [id number] No INJECT 35-40 UNITS SQ 3 TIMES DAILY WITH MEALS  Patient taking differently: 8-30 Units. Takes 8-30 units TID with meals depending on glucose   Dettinger, Fransisca Kaufmann, MD Taking Active   ondansetron (ZOFRAN) 8 MG tablet [id number] No Take 0.5 tablets (4 mg total) by mouth every 6 (six) hours as needed. Gwenlyn Perking, FNP Taking Active   rOPINIRole (REQUIP) 0.5 MG tablet [id number] No Take 2 tablets (1 mg total) by mouth at bedtime. Gwenlyn Perking, FNP Taking Active   rosuvastatin (CRESTOR) 40 MG tablet [id number] No TAKE ONE (1) TABLET EACH DAY  Patient taking differently: Take 40 mg by mouth daily.   Dettinger, Fransisca Kaufmann, MD Taking Active   Semaglutide, 1 MG/DOSE, (OZEMPIC, 1 MG/DOSE,) 2 MG/1.5ML SOPN [id number] No Inject 1 mg into the skin once a week. [provider] Taking Active            Med Note Blanca Friend, Royce Macadamia   Wed May 03, 2021  2:43 PM) Gets via novo nordisk patient assistance program   sertraline (ZOLOFT) 50 MG tablet [id number] No Take 1 tablet (50 mg total) by mouth daily. Gwenlyn Perking, FNP Taking Active   traZODone (DESYREL) 50 MG tablet [id number] No Take 0.5-1 tablets (25-50 mg total) by mouth at bedtime as needed for sleep. Gwenlyn Perking, FNP Taking Active   vitamin B-12 (CYANOCOBALAMIN) 1000 MCG tablet [id number] No Take 1,000 mcg by mouth daily. [provider] Taking Active Self  Vitamin D, Ergocalciferol, (DRISDOL) 1.25 MG (50000 UNIT) CAPS capsule [id number] No TAKE 1 CAPSULE EVERY [place], [place], FNP Taking Active Self  Med List Note Gwenlyn Perking, [place] 08/09/21 1015): nov            Patient Active Problem  List   Diagnosis Date Noted   RLS (restless legs syndrome) 01/09/2021   Insomnia due to medical condition 01/09/2021   Congestive  heart failure ([place]) 11/30/2020   Near syncope 11/16/2020   Depression, recurrent ([place]) 11/02/2020   UTI (urinary tract infection) 10/11/2020   Generalized weakness 10/11/2020   Recurrent falls 10/11/2020   Osteopenia after menopause 12/13/2017   H/O prolonged Q-T interval on ECG 03/06/2017   Vitamin B12 deficiency 01/13/2016   History of stroke 09/01/2015   History of TIA (transient ischemic attack) 08/14/2014   DM type 2 with diabetic dyslipidemia ([place]) 08/14/2014   Cataract 02/08/2014   Macular degeneration 02/08/2014   Accelerating angina ([facility]) 01/07/2014   Generalized anxiety disorder 09/14/2013   Claudication ([place]) 06/18/2013   Gastroesophageal reflux disease without esophagitis 01/01/2012   Hypokalemia 08/24/2011   Hyperglycemia due to diabetes mellitus ([place]) 08/23/2011   Essential hypertension 08/23/2011   PALPITATIONS 02/10/2010   Hyperlipidemia 05/10/2009   Depression with anxiety 05/10/2009   Coronary artery disease involving native coronary artery of native heart with angina pectoris ([place]) 08/26/2008    Immunization History  Administered Date(s) Administered   Fluad Quad(high Dose 65+) 06/17/2019, 07/09/2020, 06/09/2021   Influenza, High Dose Seasonal PF 06/19/2017, 06/18/2018   Influenza,inj,Quad PF,6+ Mos 06/28/2015   Influenza-Unspecified 05/25/2014, 06/15/2016   Moderna Sars-Covid-2 Vaccination 11/19/2019, 12/18/2019   Pneumococcal Conjugate-13 02/08/2014   Pneumococcal Polysaccharide-23 06/28/2015   Tdap 11/22/2010    Conditions to be addressed/monitored: DMII and CKD Stage 3A  Care Plan : PHARMD MEDICATION MANAGEMENT  Updates made by Lavera Guise, [place] since 09/15/2021 12:00 AM     Problem: DISEASE PROGRESSION PREVENTION      Long-Range Goal: T2DM   This Visit's Progress: Not on track  Note:   Current Barriers:   Unable to independently afford treatment regimen Unable to achieve control of t2dm  Unable to maintain control of t2dm   Pharmacist Clinical Goal(s):  patient will verbalize ability to afford treatment regimen achieve control of t2dm as evidenced by goal a1c<7%  through collaboration with PharmD and provider.    Interventions: 1:1 collaboration with Gwenlyn Perking, FNP regarding development and update of comprehensive plan of care as evidenced by provider attestation and co-signature Inter-disciplinary care team collaboration (see longitudinal plan of care) Comprehensive medication review performed; medication list updated in electronic medical record  Diabetes: New goal. Uncontrolled; current treatment:toujeo 54 units, novolog 10-15 units 3x w/ meals, ozempic 72m;  A1c 7.7% (improved); GFR 58 Patient going low overnight in the 60s Need to see libre data-patient to bring in Eating supper at 4pm-recommended protein/fiber snack before bed (I.e. half PB sandwhich) Will re enroll for patient assistance with novo nordisk 2023 (tresiba, novolog, ozempic) reports hypoglycemic/hyperglycemic symptoms Discussed meal planning options and Plate method for healthy eating Avoid sugary drinks and desserts Incorporate balanced protein, non starchy veggies, 1 serving of carbohydrate with each meal Increase water intake Increase physical activity as able Current exercise: n/a Recommended decrease toujeo to 50 units, meal time ~10 units depending on carb Will re enroll for patient assistance with novo nordisk 2023 (tresiba, novolog, ozempic)   Patient Goals/Self-Care Activities patient will:  - take medications as prescribed as evidenced by patient report and record review check glucose using libre 2 CGM, document, and provide at future appointments collaborate with provider on medication access solutions      Medication Assistance: Application for NOVO [place] medication assistance  program. in process.  Anticipated assistance start date TBD.  See plan of care for additional detail.  Patient's [facility] pharmacy [facility]:  [facility]  [place] [address] Phone: [phone] Fax: [phone]  Follow Up:  Patient agrees to Care Plan and Follow-up.  Plan: Telephone follow up appointment with care management team member scheduled for:  1-2 MONTHS  Regina Eck, PharmD, BCPS Clinical Pharmacist, [place]  II Phone [phone]

## 2021-09-13 ENCOUNTER — Telehealth: Payer: Self-pay | Admitting: Family Medicine

## 2021-09-13 NOTE — Telephone Encounter (Signed)
Pt called stating that it is going to cost her $45 to fill her Toujeo [facility] [place].  Pt says she needs [name] to send Rx to the health dept because she knows she wont have to pay for it there.   Please advise.

## 2021-09-13 NOTE — Telephone Encounter (Signed)
I spoke to pt and asked if the Toujeo was on backorder at the pharmacy and pt states she didn't realize it had been sent into the pharmacy. Advised pt it was sent in 08/09/2021. Pt will call pharmacy.

## 2021-09-13 NOTE — Patient Instructions (Signed)
Visit Information  Thank you for taking time to visit with me today. Please don't hesitate to contact me if I can be of assistance to you before our next scheduled telephone appointment.  Following are the goals we discussed today:  Current Barriers:  Unable to independently afford treatment regimen Unable to achieve control of t2dm  Unable to maintain control of t2dm   Pharmacist Clinical Goal(s):  patient will verbalize ability to afford treatment regimen achieve control of t2dm as evidenced by goal a1c<7% through collaboration with PharmD and provider.    Interventions: 1:1 collaboration with Gwenlyn Perking, FNP regarding development and update of comprehensive plan of care as evidenced by provider attestation and co-signature Inter-disciplinary care team collaboration (see longitudinal plan of care) Comprehensive medication review performed; medication list updated in electronic medical record  Diabetes: New goal. Uncontrolled; current treatment:toujeo 54 units, novolog 10-15 units 3x w/ meals, ozempic 1mg ;  A1c 7.7% (improved); GFR 58 Patient going low overnight in the 60s Need to see libre data-patient to bring in Eating supper at 4pm-recommended protein/fiber snack before bed (I.e. half PB sandwhich) Will re enroll for patient assistance with novo nordisk 2023 (tresiba, novolog, ozempic) reports hypoglycemic/hyperglycemic symptoms Discussed meal planning options and Plate method for healthy eating Avoid sugary drinks and desserts Incorporate balanced protein, non starchy veggies, 1 serving of carbohydrate with each meal Increase water intake Increase physical activity as able Current exercise: n/a Recommended decrease toujeo to 50 units, meal time ~10 units depending on carb Will re enroll for patient assistance with novo nordisk 2023 (tresiba, novolog, ozempic)   Patient Goals/Self-Care Activities patient will:  - take medications as prescribed as evidenced by  patient report and record review check glucose using libre 2 CGM, document, and provide at future appointments collaborate with provider on medication access solutions   Please call the care guide team at [phone] if you need to cancel or reschedule your appointment.   If you are experiencing a Mental Health or [place] or need someone to talk to, please call the Suicide and Crisis Lifeline: 988   The patient verbalized understanding of instructions, educational materials, and care plan provided today and declined offer to receive copy of patient instructions, educational materials, and care plan.   Signature Regina Eck, PharmD, BCPS Clinical Pharmacist, [place]  II Phone [phone]

## 2021-09-13 NOTE — Telephone Encounter (Signed)
Pt needs for Almyra Free to fax a copy of social security income statement. She says she dropped off the ppw one day last week. Please fax to [phone]

## 2021-09-14 NOTE — Telephone Encounter (Signed)
Called patient she states she was not aware of these she would like for you to continue to help her. No need to fax form to the health department.

## 2021-09-14 NOTE — Telephone Encounter (Signed)
We are helping with patient assistance programs.  Does she need me to fax social security info to health department still?  We both can't be helping her.  It has to be the health dept or [name].  She may be referring to something else, but I don't feel comfortable faxing this info to another place without knowing the reason.  Please advise

## 2021-09-21 DIAGNOSIS — F419 Anxiety disorder, unspecified: Secondary | ICD-10-CM | POA: Diagnosis not present

## 2021-09-21 DIAGNOSIS — Z9181 History of falling: Secondary | ICD-10-CM | POA: Diagnosis not present

## 2021-09-21 DIAGNOSIS — I471 Supraventricular tachycardia: Secondary | ICD-10-CM | POA: Diagnosis not present

## 2021-09-21 DIAGNOSIS — F331 Major depressive disorder, recurrent, moderate: Secondary | ICD-10-CM | POA: Diagnosis not present

## 2021-09-21 DIAGNOSIS — Z515 Encounter for palliative care: Secondary | ICD-10-CM | POA: Diagnosis not present

## 2021-09-23 DIAGNOSIS — I10 Essential (primary) hypertension: Secondary | ICD-10-CM

## 2021-09-23 DIAGNOSIS — E785 Hyperlipidemia, unspecified: Secondary | ICD-10-CM

## 2021-09-23 DIAGNOSIS — E1169 Type 2 diabetes mellitus with other specified complication: Secondary | ICD-10-CM

## 2021-09-29 ENCOUNTER — Telehealth: Payer: Self-pay | Admitting: Family Medicine

## 2021-10-03 NOTE — Telephone Encounter (Signed)
Hey! I didn't see a note on her lilly cares--was she approved?  Thanks!

## 2021-10-03 NOTE — Telephone Encounter (Signed)
Oh that's right! I switched her to all novo products to make it easy Tresiba, novolog & ozempic Were these approved for her? Please let patient and me know when you get a chance! Thank you!

## 2021-10-04 NOTE — Telephone Encounter (Signed)
Spoke with pt regarding pt assistance medications. Informed pt all 3 medications (insulins & ozempic) are in the process of renewal, application was submitted the end of last month. Let her know the turnover time for approvals and shipments is behind right now due to high demand, especially with the ozempic. Once approved medications will ship to [facility] and someone from the office will contact her for pickup. Also informed pt she should let the health dept know she will no longer need their assistance for those medications.

## 2021-10-04 NOTE — Telephone Encounter (Signed)
Please let patient know we have filed her paperwork for patient assistance in December.  We are waiting to hear back from novo nordisk patient assistance for her: basal insulin (tresiba--she was on toujeo, this is very similar) meal time insulin (novolog) Ozempic  The holidays have delayed applications and processing times;  She can call company at any time to check on the status [phone].  We will call her when medication ships.  With our high volume, I do not have time to call --my technician will be reaching out to her soon.  She does not have to use the health department

## 2021-10-04 NOTE — Telephone Encounter (Signed)
Patient is calling back about getting her insulin through [place]. Health Dept needs a answer so they know what to do. Patient states Almyra Free has all of her paperwork.

## 2021-10-05 ENCOUNTER — Other Ambulatory Visit: Payer: Self-pay | Admitting: Family Medicine

## 2021-10-12 ENCOUNTER — Ambulatory Visit (INDEPENDENT_AMBULATORY_CARE_PROVIDER_SITE_OTHER): Payer: PPO | Admitting: Pharmacist

## 2021-10-12 DIAGNOSIS — E1169 Type 2 diabetes mellitus with other specified complication: Secondary | ICD-10-CM

## 2021-10-12 DIAGNOSIS — E785 Hyperlipidemia, unspecified: Secondary | ICD-10-CM

## 2021-10-12 NOTE — Progress Notes (Signed)
Chronic Care Management Pharmacy Note  10/12/2021 Name:  [name] MRN:  [id number] DOB:  Nov 30, 1950  Summary: T2DM, HLD  Recommendations/Changes made from today's visit:  Diabetes: Goal on track: NO. Uncontrolled-reports post prandials have increased;  A1c 7.7% (improved); GFR 58 current treatment: toujeo 54 units, novolog 10 units 3x w/ meals, ozempic 17m;  Will re enroll for patient assistance with novo nordisk 2023 (tresiba, novolog, ozempic) Denies hypoglycemia (previously had) Discussed meal planning options and Plate method for healthy eating Avoid sugary drinks and desserts Incorporate balanced protein, non starchy veggies, 1 serving of carbohydrate with each meal Increase water intake Increase physical activity as able Discussed FOLLOWING A HEART HEALTHY DIET/HEALTHY PLATE METHOD Lipids currently controlled on rosuvastatin 425mCurrent exercise: n/a Recommended INCREASE Novolog meal time ~10-20 units depending on carb (low,med,high)--discussed with patient; she has omitted some doses of novolog; encouraged compliance with meal time insulin to keep controlled Will re enroll for patient assistance with novo nordisk 2023 (tresiba, novolog, ozempic)   Patient Goals/Self-Care Activities patient will:  - take medications as prescribed as evidenced by patient report and record review check glucose using libre 2 CGM, document, and provide at future appointments collaborate with provider on medication access solutions   Plan: f/u in 3 months  Subjective: [name] an [age].o. year old female who is a primary patient of MoGwenlyn PerkingFNP.  The CCM team was consulted for assistance with disease management and care coordination needs.    Engaged with patient by telephone for follow up visit in response to provider referral for pharmacy case management and/or care coordination services.   Consent to Services:  The patient was given information  about Chronic Care Management services, agreed to services, and gave verbal consent prior to initiation of services.  Please see initial visit note for detailed documentation.   Patient Care Team: MoGwenlyn PerkingFNP as PCP - General (Family Medicine) RoDaneil DolinMD (Gastroenterology) HoMinus BreedingMD (Cardiology) HuIlean ChinaRN as Registered Nurse   Objective:  Lab Results  Component Value Date   CREATININE 1.03 (H) 08/09/2021   CREATININE 1.03 (H) 02/23/2021   CREATININE 1.17 (H) 11/30/2020    Lab Results  Component Value Date   HGBA1C 7.7 (H) 08/09/2021   Last diabetic Eye exam:  Lab Results  Component Value Date/Time   HMDIABEYEEXA No Retinopathy 11/29/2015 12:00 AM    Last diabetic Foot exam: No results found for: HMDIABFOOTEX      Component Value Date/Time   CHOL 130 08/09/2021 0959   CHOL 107 02/05/2013 1002   TRIG 93 08/09/2021 0959   TRIG 149 11/30/2015 0947   TRIG 143 02/05/2013 1002   HDL 39 (L) 08/09/2021 0959   HDL 35 (L) 11/30/2015 0947   HDL 35 (L) 02/05/2013 1002   CHOLHDL 3.3 08/09/2021 0959   CHOLHDL 3.9 09/22/2017 0518   VLDL 26 09/22/2017 0518   LDLCALC 73 08/09/2021 0959   LDLCALC 195 (H) 04/26/2014 1029   LDLCALC 43 02/05/2013 1002   LDLDIRECT 93 04/06/2015 1038    Hepatic Function Latest Ref Rng & Units 08/09/2021 02/23/2021 11/30/2020  Total Protein 6.0 - 8.5 g/dL 7.0 7.2 6.9  Albumin 3.7 - 4.7 g/dL 4.3 4.3 4.1  AST 0 - 40 IU/L _0 ALT 0 - 32 IU/L _1 Alk Phosphatase 44 - 121 IU/L 59 67 59  Total Bilirubin 0.0 - 1.2 mg/dL 0.3 0.3  0.2  Bilirubin, Direct 0.00 - 0.40 mg/dL - - -    Lab Results  Component Value Date/Time   TSH 3.372 11/15/2020 08:14 PM   TSH 1.950 04/23/2018 09:43 AM   TSH 1.550 04/06/2015 10:38 AM   FREET4 1.16 04/17/2007 06:30 AM    CBC Latest Ref Rng & Units 08/09/2021 02/23/2021 11/30/2020  WBC 3.4 - 10.8 x10E3/uL 8.0 7.5 7.0  Hemoglobin 11.1 - 15.9 g/dL 13.1 12.0 10.9(L)  Hematocrit 34.0  - 46.6 % 38.2 36.4 32.9(L)  Platelets 150 - 450 x10E3/uL 184 165 191    Lab Results  Component Value Date/Time   VD25OH 90.28 11/15/2020 08:14 PM   VD25OH 45.4 02/20/2019 11:52 AM    Clinical ASCVD: No  The 10-year ASCVD risk score (Arnett DK, et al., 2019) is: 34.9%   Values used to calculate the score:     Age: 71 years     Sex: Female     Is Non-Hispanic African American: No     Diabetic: Yes     Tobacco smoker: No     Systolic Blood Pressure: 599 mmHg     Is BP treated: Yes     HDL Cholesterol: 39 mg/dL     Total Cholesterol: 130 mg/dL    Other: (CHADS2VASc if Afib, PHQ9 if depression, MMRC or CAT for COPD, ACT, DEXA)  Social History   Tobacco Use  Smoking Status Never  Smokeless Tobacco Never  Tobacco Comments   spouse, 16 years - husband has quit 01/2011   BP Readings from Last 3 Encounters:  08/09/21 (!) 151/76  05/10/21 (!) 152/80  02/23/21 133/61   Pulse Readings from Last 3 Encounters:  08/09/21 87  05/10/21 88  01/30/21 97   Wt Readings from Last 3 Encounters:  08/09/21 156 lb (70.8 kg)  05/10/21 156 lb (70.8 kg)  02/23/21 154 lb (69.9 kg)    Assessment: Review of patient past medical history, allergies, medications, health status, including review of consultants reports, laboratory and other test data, was performed as part of comprehensive evaluation and provision of chronic care management services.   SDOH:  (Social Determinants of Health) assessments and interventions performed:    CCM Care Plan  Allergies  Allergen Reactions   Iohexol      Desc: pt had syncopal episode with nausea post IV CM late 1990's,  pt has had prednisone prep with heart caths x 2 without problem  kdean 04/16/07, Onset Date: [id number]    Ticlid [Ticlopidine Hcl] Nausea And Vomiting   Jardiance [Empagliflozin] Other (See Comments)    Recurrent UTIs   Metformin And Related Diarrhea   Codeine Nausea And Vomiting and Palpitations    Medications Reviewed Today      Reviewed by Lavera Guise, [facility] (Pharmacist) on 10/12/21 at 0846  Med List Status: <None>   Medication Order Taking? Sig Documenting Provider Last Dose Status Informant  amLODipine (NORVASC) 10 MG tablet [id number] No Take 1 tablet (10 mg total) by mouth daily. Roxan Hockey, MD Taking Active Self  aspirin 325 MG tablet [id number] No Take 325 mg by mouth daily. [provider] Taking Active Self  atenolol (TENORMIN) 50 MG tablet [id number] No Take 1 tablet (50 mg total) by mouth 2 (two) times daily. Gwenlyn Perking, FNP Taking Active   calcium carbonate (OS-CAL) 600 MG TABS tablet [id number] No Take 1 tablet (600 mg total) by mouth 2 (two) times daily with a meal. Gwenlyn Perking, FNP Taking Active Self  citalopram (CELEXA)  40 MG tablet [id number] No Take 40 mg by mouth daily. [provider] Taking Active   Continuous Blood Gluc Receiver (FREESTYLE LIBRE 2 READER) DEVI [id number] No USE TO TEST BLOOD SUGAR 6 TIMES DAILY. Dx:E11.65 Gwenlyn Perking, FNP Taking Active   Continuous Blood Gluc Sensor (FREESTYLE LIBRE 2 SENSOR) MISC [id number] No USE TO TEST BLOOD SUGAR 6 TIMES DAILY. Dx:E11.65 Gwenlyn Perking, FNP Taking Active   esomeprazole (NEXIUM) 40 MG capsule [id number] No Take 1 capsule (40 mg total) by mouth daily. Loman Brooklyn, FNP Taking Active Self  furosemide (LASIX) 20 MG tablet [id number]  TAKE ONE (1) TABLET EACH DAY Rakes, Connye Burkitt, FNP  Active   hydrOXYzine (ATARAX/VISTARIL) 10 MG tablet [id number] No Take 1 tablet (10 mg total) by mouth 3 (three) times daily as needed. Gwenlyn Perking, FNP Taking Active   insulin glargine, 1 Unit Dial, (TOUJEO SOLOSTAR) 300 UNIT/ML Solostar Pen [id number]  Inject 54 Units into the skin daily. Gwenlyn Perking, FNP  Active   isosorbide mononitrate (IMDUR) 60 MG 24 hr tablet [id number] No TAKE ONE (1) TABLET EACH DAY Gwenlyn Perking, FNP Taking Active   meclizine (ANTIVERT) 25 MG tablet [id number] No Take 1 tablet (25 mg total) by  mouth 3 (three) times daily as needed for dizziness. Dettinger, Fransisca Kaufmann, MD Taking Active Self  nitroGLYCERIN (NITROSTAT) 0.4 MG SL tablet [id number] No DISSOLVE 1 TABLET UNDER TONGUE FOR CHESTPAIN.MAY REPEAT EVERY 5 MINUTES FOR 3 DOSES.IF NO RELIEF CALL 911 OR GO TO ER Gwenlyn Perking, FNP Taking Active Self  NOVOLOG FLEXPEN 100 UNIT/ML FlexPen [id number] No INJECT 35-40 UNITS SQ 3 TIMES DAILY WITH MEALS  Patient taking differently: 8-30 Units. Takes 8-30 units TID with meals depending on glucose   Dettinger, Fransisca Kaufmann, MD Taking Active   ondansetron (ZOFRAN) 8 MG tablet [id number] No Take 0.5 tablets (4 mg total) by mouth every 6 (six) hours as needed. Gwenlyn Perking, FNP Taking Active   rOPINIRole (REQUIP) 0.5 MG tablet [id number] No Take 2 tablets (1 mg total) by mouth at bedtime. Gwenlyn Perking, FNP Taking Active   rosuvastatin (CRESTOR) 40 MG tablet [id number] No TAKE ONE (1) TABLET EACH DAY  Patient taking differently: Take 40 mg by mouth daily.   Dettinger, Fransisca Kaufmann, MD Taking Active   Semaglutide, 1 MG/DOSE, (OZEMPIC, 1 MG/DOSE,) 2 MG/1.5ML SOPN [id number] No Inject 1 mg into the skin once a week. [provider] Taking Active            Med Note Blanca Friend, Royce Macadamia   Wed May 03, 2021  2:43 PM) Gets via novo nordisk patient assistance program   sertraline (ZOLOFT) 50 MG tablet [id number] No Take 1 tablet (50 mg total) by mouth daily. Gwenlyn Perking, FNP Taking Active   traZODone (DESYREL) 50 MG tablet [id number] No Take 0.5-1 tablets (25-50 mg total) by mouth at bedtime as needed for sleep. Gwenlyn Perking, FNP Taking Active   vitamin B-12 (CYANOCOBALAMIN) 1000 MCG tablet [id number] No Take 1,000 mcg by mouth daily. [provider] Taking Active Self  Vitamin D, Ergocalciferol, (DRISDOL) 1.25 MG (50000 UNIT) CAPS capsule [id number] No TAKE 1 CAPSULE EVERY [place], [place], FNP Taking Active Self  Med List Note Gwenlyn Perking, [place] 08/09/21 1015): nov             Patient Active Problem List   Diagnosis Date Noted   RLS (restless legs syndrome) 01/09/2021   Insomnia due to  medical condition 01/09/2021   Congestive heart failure ([place]) 11/30/2020   Near syncope 11/16/2020   Depression, recurrent ([place]) 11/02/2020   UTI (urinary tract infection) 10/11/2020   Generalized weakness 10/11/2020   Recurrent falls 10/11/2020   Osteopenia after menopause 12/13/2017   H/O prolonged Q-T interval on ECG 03/06/2017   Vitamin B12 deficiency 01/13/2016   History of stroke 09/01/2015   History of TIA (transient ischemic attack) 08/14/2014   DM type 2 with diabetic dyslipidemia ([place]) 08/14/2014   Cataract 02/08/2014   Macular degeneration 02/08/2014   Accelerating angina ([facility]) 01/07/2014   Generalized anxiety disorder 09/14/2013   Claudication ([place]) 06/18/2013   Gastroesophageal reflux disease without esophagitis 01/01/2012   Hypokalemia 08/24/2011   Hyperglycemia due to diabetes mellitus ([place]) 08/23/2011   Essential hypertension 08/23/2011   PALPITATIONS 02/10/2010   Hyperlipidemia 05/10/2009   Depression with anxiety 05/10/2009   Coronary artery disease involving native coronary artery of native heart with angina pectoris ([place]) 08/26/2008    Immunization History  Administered Date(s) Administered   Fluad Quad(high Dose 65+) 06/17/2019, 07/09/2020, 06/09/2021   Influenza, High Dose Seasonal PF 06/19/2017, 06/18/2018   Influenza,inj,Quad PF,6+ Mos 06/28/2015   Influenza-Unspecified 05/25/2014, 06/15/2016   Moderna Sars-Covid-2 Vaccination 11/19/2019, 12/18/2019   Pneumococcal Conjugate-13 02/08/2014   Pneumococcal Polysaccharide-23 06/28/2015   Tdap 11/22/2010    Conditions to be addressed/monitored: HLD and DMII  Care Plan : PHARMD MEDICATION MANAGEMENT  Updates made by Lavera Guise, [place] since 10/12/2021 12:00 AM     Problem: DISEASE PROGRESSION PREVENTION      Long-Range Goal: T2DM   Recent Progress: Not on track  Note:    Current Barriers:  Unable to independently afford treatment regimen Unable to achieve control of T2DM  Unable to maintain control of T2DM  Pharmacist Clinical Goal(s):  patient will verbalize ability to afford treatment regimen achieve control of t2dm as evidenced by goal a1c<7%  through collaboration with PharmD and provider.   Interventions: 1:1 collaboration with Gwenlyn Perking, FNP regarding development and update of comprehensive plan of care as evidenced by provider attestation and co-signature Inter-disciplinary care team collaboration (see longitudinal plan of care) Comprehensive medication review performed; medication list updated in electronic medical record  Diabetes: Goal on track: NO. Uncontrolled-reports post prandials have increased;  A1c 7.7% (improved); GFR 58 current treatment: toujeo 54 units, novolog 10 units 3x w/ meals, ozempic 19m;  Will re enroll for patient assistance with novo nordisk 2023 (tresiba, novolog, ozempic) Denies hypoglycemia (previously had) Discussed meal planning options and Plate method for healthy eating Avoid sugary drinks and desserts Incorporate balanced protein, non starchy veggies, 1 serving of carbohydrate with each meal Increase water intake Increase physical activity as able Discussed FOLLOWING A HEART HEALTHY DIET/HEALTHY PLATE METHOD Lipids currently controlled on rosuvastatin 422mCurrent exercise: n/a Recommended INCREASE Novolog meal time ~10-20 units depending on carb (low,med,high)--discussed with patient; she has omitted some doses of novolog; encouraged compliance with meal time insulin to keep controlled Will re enroll for patient assistance with novo nordisk 2023 (tresiba, novolog, ozempic)   Patient Goals/Self-Care Activities patient will:  - take medications as prescribed as evidenced by patient report and record review check glucose using libre 2 CGM, document, and provide at future appointments collaborate with  provider on medication access solutions      Medication Assistance:  TRESIBA/NOVOLOG/OZEMPIC obtained through NOWheeleredication assistance program.  Enrollment ends 03/18/22  [email or web address]  [address] [place] [address] Phone: [phone] Fax: [phone]   Follow Up:  Patient agrees to Care Plan and Follow-up.  Plan: Telephone follow up appointment with care management team member scheduled for:  3 months   Regina Eck, PharmD, BCPS Clinical Pharmacist, [place]  II Phone [phone]

## 2021-10-12 NOTE — Patient Instructions (Signed)
Visit Information  Following are the goals we discussed today:  Current Barriers:  Unable to independently afford treatment regimen Unable to achieve control of T2DM  Unable to maintain control of T2DM  Pharmacist Clinical Goal(s):  patient will verbalize ability to afford treatment regimen achieve control of t2dm as evidenced by goal a1c<7% through collaboration with PharmD and provider.   Interventions: 1:1 collaboration with Gwenlyn Perking, FNP regarding development and update of comprehensive plan of care as evidenced by provider attestation and co-signature Inter-disciplinary care team collaboration (see longitudinal plan of care) Comprehensive medication review performed; medication list updated in electronic medical record  Diabetes: Goal on track: NO. Uncontrolled-reports post prandials have increased;  A1c 7.7% (improved); GFR 58 current treatment: toujeo 54 units, novolog 10 units 3x w/ meals, ozempic 1mg ;  Will re enroll for patient assistance with novo nordisk 2023 (tresiba, novolog, ozempic) Denies hypoglycemia (previously had) Discussed meal planning options and Plate method for healthy eating Avoid sugary drinks and desserts Incorporate balanced protein, non starchy veggies, 1 serving of carbohydrate with each meal Increase water intake Increase physical activity as able Discussed FOLLOWING A HEART HEALTHY DIET/HEALTHY PLATE METHOD Lipids currently controlled on rosuvastatin 40mg  Current exercise: n/a Recommended INCREASE Novolog meal time ~10-20 units depending on carb (low,med,high)--discussed with patient; she has omitted some doses of novolog; encouraged compliance with meal time insulin to keep controlled Will re enroll for patient assistance with novo nordisk 2023 (tresiba, novolog, ozempic)   Patient Goals/Self-Care Activities patient will:  - take medications as prescribed as evidenced by patient report and record review check glucose using libre 2 CGM,  document, and provide at future appointments collaborate with provider on medication access solutions   Plan: Telephone follow up appointment with care management team member scheduled for:  3 months  Signature Regina Eck, PharmD, BCPS Clinical Pharmacist, [place]  II Phone [phone]   Please call the care guide team at [phone] if you need to cancel or reschedule your appointment.   The patient verbalized understanding of instructions, educational materials, and care plan provided today and agreed to receive a mailed copy of patient instructions, educational materials, and care plan.

## 2021-10-13 DIAGNOSIS — I509 Heart failure, unspecified: Secondary | ICD-10-CM | POA: Diagnosis not present

## 2021-10-13 DIAGNOSIS — I25118 Atherosclerotic heart disease of native coronary artery with other forms of angina pectoris: Secondary | ICD-10-CM | POA: Diagnosis not present

## 2021-10-13 DIAGNOSIS — N183 Chronic kidney disease, stage 3 unspecified: Secondary | ICD-10-CM | POA: Diagnosis not present

## 2021-10-13 DIAGNOSIS — Z794 Long term (current) use of insulin: Secondary | ICD-10-CM | POA: Diagnosis not present

## 2021-10-13 DIAGNOSIS — E1159 Type 2 diabetes mellitus with other circulatory complications: Secondary | ICD-10-CM | POA: Diagnosis not present

## 2021-10-13 DIAGNOSIS — E1151 Type 2 diabetes mellitus with diabetic peripheral angiopathy without gangrene: Secondary | ICD-10-CM | POA: Diagnosis not present

## 2021-10-13 DIAGNOSIS — Z6824 Body mass index (BMI) 24.0-24.9, adult: Secondary | ICD-10-CM | POA: Diagnosis not present

## 2021-10-13 DIAGNOSIS — I480 Paroxysmal atrial fibrillation: Secondary | ICD-10-CM | POA: Diagnosis not present

## 2021-10-13 DIAGNOSIS — N390 Urinary tract infection, site not specified: Secondary | ICD-10-CM | POA: Diagnosis not present

## 2021-10-13 DIAGNOSIS — F331 Major depressive disorder, recurrent, moderate: Secondary | ICD-10-CM | POA: Diagnosis not present

## 2021-10-13 DIAGNOSIS — E1122 Type 2 diabetes mellitus with diabetic chronic kidney disease: Secondary | ICD-10-CM | POA: Diagnosis not present

## 2021-10-13 DIAGNOSIS — Z515 Encounter for palliative care: Secondary | ICD-10-CM | POA: Diagnosis not present

## 2021-10-23 ENCOUNTER — Telehealth: Payer: Self-pay | Admitting: Family Medicine

## 2021-10-23 DIAGNOSIS — Z87440 Personal history of urinary (tract) infections: Secondary | ICD-10-CM | POA: Diagnosis not present

## 2021-10-23 DIAGNOSIS — J80 Acute respiratory distress syndrome: Secondary | ICD-10-CM | POA: Diagnosis not present

## 2021-10-23 DIAGNOSIS — N183 Chronic kidney disease, stage 3 unspecified: Secondary | ICD-10-CM | POA: Diagnosis not present

## 2021-10-23 DIAGNOSIS — R350 Frequency of micturition: Secondary | ICD-10-CM | POA: Diagnosis not present

## 2021-10-23 NOTE — Telephone Encounter (Signed)
Karen Jarvis NP from [place] calling about [name] Apr 19, 1951. NP did a home visit today. Pt has continuing frequent urine. Pt is done with macrobid. NP did a urine drip in the house it was neg for white blood cells and nitrites but positive for blood. pt advise to follow up with pcp. Pt is having sinus pain and pressure. She has a productive cough. NP will start abx for sinus infection. NP decline talking to a nurse, she just asked that a message be sent to PCP.

## 2021-10-24 DIAGNOSIS — E785 Hyperlipidemia, unspecified: Secondary | ICD-10-CM

## 2021-10-24 DIAGNOSIS — E1169 Type 2 diabetes mellitus with other specified complication: Secondary | ICD-10-CM

## 2021-10-26 DIAGNOSIS — Z87440 Personal history of urinary (tract) infections: Secondary | ICD-10-CM | POA: Diagnosis not present

## 2021-10-26 DIAGNOSIS — J019 Acute sinusitis, unspecified: Secondary | ICD-10-CM | POA: Diagnosis not present

## 2021-10-26 DIAGNOSIS — J069 Acute upper respiratory infection, unspecified: Secondary | ICD-10-CM | POA: Diagnosis not present

## 2021-10-26 DIAGNOSIS — B9689 Other specified bacterial agents as the cause of diseases classified elsewhere: Secondary | ICD-10-CM | POA: Diagnosis not present

## 2021-10-30 DIAGNOSIS — J018 Other acute sinusitis: Secondary | ICD-10-CM | POA: Diagnosis not present

## 2021-10-30 DIAGNOSIS — Z87440 Personal history of urinary (tract) infections: Secondary | ICD-10-CM | POA: Diagnosis not present

## 2021-10-31 DIAGNOSIS — B9689 Other specified bacterial agents as the cause of diseases classified elsewhere: Secondary | ICD-10-CM | POA: Diagnosis not present

## 2021-10-31 DIAGNOSIS — J019 Acute sinusitis, unspecified: Secondary | ICD-10-CM | POA: Diagnosis not present

## 2021-11-02 ENCOUNTER — Telehealth (INDEPENDENT_AMBULATORY_CARE_PROVIDER_SITE_OTHER): Payer: PPO | Admitting: Family

## 2021-11-02 ENCOUNTER — Encounter: Payer: Self-pay | Admitting: Family

## 2021-11-02 ENCOUNTER — Telehealth: Payer: Self-pay

## 2021-11-02 DIAGNOSIS — R319 Hematuria, unspecified: Secondary | ICD-10-CM

## 2021-11-02 DIAGNOSIS — E785 Hyperlipidemia, unspecified: Secondary | ICD-10-CM | POA: Diagnosis not present

## 2021-11-02 DIAGNOSIS — B3731 Acute candidiasis of vulva and vagina: Secondary | ICD-10-CM | POA: Diagnosis not present

## 2021-11-02 DIAGNOSIS — E1169 Type 2 diabetes mellitus with other specified complication: Secondary | ICD-10-CM

## 2021-11-02 LAB — MICROSCOPIC EXAMINATION

## 2021-11-02 LAB — URINALYSIS, ROUTINE W REFLEX MICROSCOPIC
Bilirubin, UA: NEGATIVE
Ketones, UA: NEGATIVE
Leukocytes,UA: NEGATIVE
Nitrite, UA: NEGATIVE
Protein,UA: NEGATIVE
Specific Gravity, UA: 1.015 (ref 1.005–1.030)
Urobilinogen, Ur: 1 mg/dL (ref 0.2–1.0)
pH, UA: 6 (ref 5.0–7.5)

## 2021-11-02 LAB — BAYER DCA HB A1C WAIVED: HB A1C (BAYER DCA - WAIVED): 8 % — ABNORMAL HIGH (ref 4.8–5.6)

## 2021-11-02 MED ORDER — FLUCONAZOLE 150 MG PO TABS: 150.0000 mg | ORAL_TABLET | ORAL | 0 refills | Status: DC | PRN

## 2021-11-02 MED ORDER — TOUJEO SOLOSTAR 300 UNIT/ML ~~LOC~~ SOPN: 58.0000 [IU] | PEN_INJECTOR | Freq: Every day | SUBCUTANEOUS | 2 refills | Status: DC

## 2021-11-02 NOTE — Telephone Encounter (Signed)
Pt states she felt like she had a uti so [facility] got her to get a urine sample and states nurse said blood in urine. She states she wanted her a1c checked as well. I have placed labs and she is ocming by to leave sample and awaiting televisit with [employer]

## 2021-11-02 NOTE — Progress Notes (Signed)
Virtual Visit Consent   [name], you are scheduled for a virtual visit with a [facility] provider today.     Just as with appointments in the office, your consent must be obtained to participate.  Your consent will be active for this visit and any virtual visit you may have with one of our providers in the next 365 days.     If you have a MyChart account, a copy of this consent can be sent to you electronically.  All virtual visits are billed to your insurance company just like a traditional visit in the office.    As this is a virtual visit, video technology does not allow for your provider to perform a traditional examination.  This may limit your provider's ability to fully assess your condition.  If your provider identifies any concerns that need to be evaluated in person or the need to arrange testing (such as labs, EKG, etc.), we will make arrangements to do so.     Although advances in technology are sophisticated, we cannot ensure that it will always work on either your end or our end.  If the connection with a video visit is poor, the visit may have to be switched to a telephone visit.  With either a video or telephone visit, we are not always able to ensure that we have a secure connection.     I need to obtain your verbal consent now.   Are you willing to proceed with your visit today?    Marybeth Dandy has provided verbal consent on 11/02/2021 for a virtual visit (video or telephone).   Evelina Dun, FNP   Date: 11/02/2021 12:04 PM   Virtual Visit via Video Note   I, Evelina Dun, connected with  [name]  ([id number], Sep 28, 1950) on 11/02/21 at  4:45 PM EST by a video-enabled telemedicine application and verified that I am speaking with the correct person using two identifiers.  Location: Patient: Virtual Visit Location Patient: Home Provider: Virtual Visit Location Provider: Office/Clinic   I discussed the limitations of evaluation and  management by telemedicine and the availability of in person appointments. The patient expressed understanding and agreed to proceed.    History of Present Illness: [name] is a 71 y.o. who identifies as a female who was assigned female at birth, and is being seen today for hematuria. She has home health and checked her urine last week and was told she has some blood in her urine. She is complaining of vaginal itching that started yesterday.   HPI: Hematuria This is a new problem. The current episode started in the past 7 days. The problem has been waxing and waning since onset. She describes the hematuria as microscopic hematuria. Irritative symptoms do not include frequency or urgency. Associated symptoms include dysuria. Pertinent negatives include no flank pain, hesitancy, nausea or vomiting.  Dysuria  This is a new problem. The current episode started today. The problem has been waxing and waning. The quality of the pain is described as burning. The pain is at a severity of 1/10. Associated symptoms include hematuria. Pertinent negatives include no flank pain, frequency, hesitancy, nausea, urgency or vomiting. She has tried increased fluids for the symptoms. The treatment provided mild relief.  Diabetes She presents for her follow-up diabetic visit. She has type 2 diabetes mellitus. Pertinent negatives for diabetes include no blurred vision and no foot paresthesias. Symptoms are stable. Risk factors for coronary artery  disease include dyslipidemia, diabetes mellitus, hypertension, sedentary lifestyle and post-menopausal. She is following a generally unhealthy diet. Her overall blood glucose range is >200 mg/dl.   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   Congestive heart failure ([place]) 11/30/2020   Near syncope 11/16/2020   Depression, recurrent ([place]) 11/02/2020   UTI  (urinary tract infection) 10/11/2020   Generalized weakness 10/11/2020   Recurrent falls 10/11/2020   Osteopenia after menopause 12/13/2017   H/O prolonged Q-T interval on ECG 03/06/2017   Vitamin B12 deficiency 01/13/2016   History of stroke 09/01/2015   History of TIA (transient ischemic attack) 08/14/2014   DM type 2 with diabetic dyslipidemia ([place]) 08/14/2014   Cataract 02/08/2014   Macular degeneration 02/08/2014   Accelerating angina (HCC) 01/07/2014   Generalized anxiety disorder 09/14/2013   Claudication ([place]) 06/18/2013   Gastroesophageal reflux disease without esophagitis 01/01/2012   Hypokalemia 08/24/2011   Hyperglycemia due to diabetes mellitus ([place]) 08/23/2011   Essential hypertension 08/23/2011   PALPITATIONS 02/10/2010   Hyperlipidemia 05/10/2009   Depression with anxiety 05/10/2009   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    Allergies:  Allergies  Allergen Reactions   Iohexol      Desc: pt had syncopal episode with nausea post IV CM late 1990's,  pt has had prednisone prep with heart caths x 2 without problem  kdean 04/16/07, Onset Date: [id number]    Ticlid [Ticlopidine Hcl] Nausea And Vomiting   Jardiance [Empagliflozin] Other (See Comments)    Recurrent UTIs   Metformin And Related Diarrhea   Codeine Nausea And Vomiting and Palpitations   Medications:  Current Outpatient Medications:    fluconazole (DIFLUCAN) 150 MG tablet, Take 1 tablet (150 mg total) by mouth every three (3) days as needed., Disp: 2 tablet, Rfl: 0   amLODipine (NORVASC) 10 MG tablet, Take 1 tablet (10 mg total) by mouth daily., Disp: 30 tablet, Rfl: 5   aspirin 325 MG tablet, Take 325 mg by mouth daily., Disp: , Rfl:    atenolol (TENORMIN) 50 MG tablet, Take 1 tablet (50 mg total) by mouth 2 (two) times daily., Disp: 180 tablet, Rfl: 3   calcium carbonate (OS-CAL) 600 MG TABS tablet, Take 1 tablet (600 mg total) by  mouth 2 (two) times daily with a meal., Disp: 180 tablet, Rfl: 3   citalopram (CELEXA) 40 MG tablet, Take 40 mg by mouth daily., Disp: , Rfl:    Continuous Blood Gluc Receiver (FREESTYLE LIBRE 2 READER) DEVI, USE TO TEST BLOOD SUGAR 6 TIMES DAILY. Dx:E11.65, Disp: 1 each, Rfl: 0   Continuous Blood Gluc Sensor (FREESTYLE LIBRE 2 SENSOR) MISC, USE TO TEST BLOOD SUGAR 6 TIMES DAILY. Dx:E11.65, Disp: 2 each, Rfl: 11   esomeprazole (NEXIUM) 40 MG capsule, Take 1 capsule (40 mg total) by mouth daily., Disp: 30 capsule, Rfl: 3   furosemide (LASIX) 20 MG tablet, TAKE ONE (1) TABLET EACH DAY, Disp: 90 tablet, Rfl: 0   hydrOXYzine (ATARAX/VISTARIL) 10 MG tablet, Take 1 tablet (10 mg total) by mouth 3 (three) times daily as needed., Disp: 30 tablet, Rfl: 0   insulin glargine, 1 Unit Dial, (TOUJEO SOLOSTAR) 300 UNIT/ML Solostar Pen, Inject 58 Units into the skin daily., Disp: 9 mL, Rfl: 2   isosorbide mononitrate (IMDUR) 60 MG 24 hr tablet, TAKE ONE (1) TABLET EACH DAY, Disp:  30 tablet, Rfl: 5   meclizine (ANTIVERT) 25 MG tablet, Take 1 tablet (25 mg total) by mouth 3 (three) times daily as needed for dizziness., Disp: 90 tablet, Rfl: 3   nitroGLYCERIN (NITROSTAT) 0.4 MG SL tablet, DISSOLVE 1 TABLET UNDER TONGUE FOR CHESTPAIN.MAY REPEAT EVERY 5 MINUTES FOR 3 DOSES.IF NO RELIEF CALL 911 OR GO TO ER, Disp: 25 tablet, Rfl: 6   NOVOLOG FLEXPEN 100 UNIT/ML FlexPen, INJECT 35-40 UNITS SQ 3 TIMES DAILY WITH MEALS (Patient taking differently: 8-30 Units. Takes 8-30 units TID with meals depending on glucose), Disp: 30 mL, Rfl: 3   ondansetron (ZOFRAN) 8 MG tablet, Take 0.5 tablets (4 mg total) by mouth every 6 (six) hours as needed., Disp: 60 tablet, Rfl: 0   rOPINIRole (REQUIP) 0.5 MG tablet, Take 2 tablets (1 mg total) by mouth at bedtime., Disp: 180 tablet, Rfl: 2   rosuvastatin (CRESTOR) 40 MG tablet, TAKE ONE (1) TABLET EACH DAY (Patient taking differently: Take 40 mg by mouth daily.), Disp: 90 tablet, Rfl: 3    Semaglutide, 1 MG/DOSE, (OZEMPIC, 1 MG/DOSE,) 2 MG/1.5ML SOPN, Inject 1 mg into the skin once a week., Disp: , Rfl:    sertraline (ZOLOFT) 50 MG tablet, Take 1 tablet (50 mg total) by mouth daily., Disp: 30 tablet, Rfl: 3   traZODone (DESYREL) 50 MG tablet, Take 0.5-1 tablets (25-50 mg total) by mouth at bedtime as needed for sleep., Disp: 90 tablet, Rfl: 0   vitamin B-12 (CYANOCOBALAMIN) 1000 MCG tablet, Take 1,000 mcg by mouth daily., Disp: , Rfl:    Vitamin D, Ergocalciferol, (DRISDOL) 1.25 MG (50000 UNIT) CAPS capsule, TAKE 1 CAPSULE EVERY 7 DAYS, Disp: 12 capsule, Rfl: 3  Observations/Objective: Patient is well-developed, well-nourished in no acute distress.  Resting comfortably  at home.  Head is normocephalic, atraumatic.  No labored breathing.  Speech is clear and coherent with logical content.  Patient is alert and oriented at baseline.    Assessment and Plan: 1. DM type 2 with diabetic dyslipidemia (HCC) - Bayer DCA Hb A1c Waived - insulin glargine, 1 Unit Dial, (TOUJEO SOLOSTAR) 300 UNIT/ML Solostar Pen; Inject 58 Units into the skin daily.  Dispense: 9 mL; Refill: 2  2. Hematuria, unspecified type - Urinalysis, Routine w reflex microscopic  3. Vagina, candidiasis - fluconazole (DIFLUCAN) 150 MG tablet; Take 1 tablet (150 mg total) by mouth every three (3) days as needed.  Dispense: 2 tablet; Refill: 0  Start diflucan  Will increase Toujeo to 58 units from 54 units Needs follow up in 1 month Strict low carb diet  Follow Up Instructions: I discussed the assessment and treatment plan with the patient. The patient was provided an opportunity to ask questions and all were answered. The patient agreed with the plan and demonstrated an understanding of the instructions.  A copy of instructions were sent to the patient via MyChart unless otherwise noted below.    The patient was advised to call back or seek an in-person evaluation if the symptoms worsen or if the condition  fails to improve as anticipated.  Time:  I spent 19 minutes with the patient via telehealth technology discussing the above problems/concerns.    Evelina Dun, FNP

## 2021-11-06 DIAGNOSIS — Z09 Encounter for follow-up examination after completed treatment for conditions other than malignant neoplasm: Secondary | ICD-10-CM | POA: Diagnosis not present

## 2021-11-06 DIAGNOSIS — Z8709 Personal history of other diseases of the respiratory system: Secondary | ICD-10-CM | POA: Diagnosis not present

## 2021-11-09 ENCOUNTER — Telehealth: Payer: Self-pay | Admitting: Family Medicine

## 2021-11-09 NOTE — Telephone Encounter (Signed)
Pt called requesting to speak with Almyra Free. Wants update on status of her getting approved for patient assistance program. Says she has already given Almyra Free all the documents she needs but has not heard anything since then.

## 2021-11-09 NOTE — Telephone Encounter (Signed)
I called novo nordisk and they say "meds are still processing" via the automated system Can you call and let patient know or if you have any other info? We appreciate her patience And she can call on her own to check on the status Would provide her with the phone number [phone]

## 2021-11-13 NOTE — Telephone Encounter (Signed)
Spoke to pt regarding novo shipment. Pt called novo nordisk and was told "her medication should deliver in 10 days". Informed pt that the office will call as soon as her shipment is in. Thanked her for her patience.

## 2021-11-24 ENCOUNTER — Telehealth: Payer: Self-pay

## 2021-11-24 NOTE — Telephone Encounter (Signed)
Call and left a message for pt to come by and pick up insulin  ?

## 2021-11-27 ENCOUNTER — Telehealth: Payer: Self-pay | Admitting: Family Medicine

## 2021-11-27 NOTE — Telephone Encounter (Signed)
Patient called the [facility] and they told her to call [facility] about Toujeo and she called them and they told her they never got anything from [facility]. She needs the #'s to call for Humalog and Ozempic. Patient don't understand what is going on because Almyra Free told her she sent it. ?

## 2021-11-28 NOTE — Telephone Encounter (Signed)
Switched to novo [facility]ord[facility] products ?Tyler Aas, novolog, ozempic ? ?Patient aware tresiba and pen needles are here ? ?The rest will ship later ?

## 2021-12-01 ENCOUNTER — Telehealth: Payer: Self-pay | Admitting: Pharmacist

## 2021-12-01 NOTE — Telephone Encounter (Signed)
OZEMPIC '1MG'$  PATIENT ASSISTANCE SUPPLY ARRIVED TO PCP OFFICE ?PATIENT CALLED ?#4 BOXES ?

## 2021-12-07 ENCOUNTER — Ambulatory Visit: Payer: PPO | Admitting: Pharmacist

## 2021-12-11 ENCOUNTER — Encounter: Payer: Self-pay | Admitting: Family Medicine

## 2021-12-11 ENCOUNTER — Ambulatory Visit (INDEPENDENT_AMBULATORY_CARE_PROVIDER_SITE_OTHER): Payer: PPO | Admitting: Family Medicine

## 2021-12-11 VITALS — BP 134/64 | HR 87 | Temp 98.6°F | Ht 66.0 in | Wt 155.5 lb

## 2021-12-11 DIAGNOSIS — E1169 Type 2 diabetes mellitus with other specified complication: Secondary | ICD-10-CM

## 2021-12-11 DIAGNOSIS — R8290 Unspecified abnormal findings in urine: Secondary | ICD-10-CM | POA: Diagnosis not present

## 2021-12-11 DIAGNOSIS — I152 Hypertension secondary to endocrine disorders: Secondary | ICD-10-CM

## 2021-12-11 DIAGNOSIS — E1159 Type 2 diabetes mellitus with other circulatory complications: Secondary | ICD-10-CM

## 2021-12-11 DIAGNOSIS — E785 Hyperlipidemia, unspecified: Secondary | ICD-10-CM | POA: Diagnosis not present

## 2021-12-11 DIAGNOSIS — N3000 Acute cystitis without hematuria: Secondary | ICD-10-CM | POA: Diagnosis not present

## 2021-12-11 DIAGNOSIS — R630 Anorexia: Secondary | ICD-10-CM | POA: Diagnosis not present

## 2021-12-11 DIAGNOSIS — F339 Major depressive disorder, recurrent, unspecified: Secondary | ICD-10-CM

## 2021-12-11 DIAGNOSIS — K219 Gastro-esophageal reflux disease without esophagitis: Secondary | ICD-10-CM | POA: Diagnosis not present

## 2021-12-11 DIAGNOSIS — F411 Generalized anxiety disorder: Secondary | ICD-10-CM

## 2021-12-11 DIAGNOSIS — Z23 Encounter for immunization: Secondary | ICD-10-CM

## 2021-12-11 LAB — MICROSCOPIC EXAMINATION
Renal Epithel, UA: NONE SEEN /hpf
WBC, UA: >30 /hpf — AB (ref 0–5)

## 2021-12-11 LAB — URINALYSIS, ROUTINE W REFLEX MICROSCOPIC
Bilirubin, UA: NEGATIVE
Glucose, UA: NEGATIVE
Ketones, UA: NEGATIVE
Nitrite, UA: NEGATIVE
Specific Gravity, UA: 1.02 (ref 1.005–1.030)
Urobilinogen, Ur: 2 mg/dL — ABNORMAL HIGH (ref 0.2–1.0)
pH, UA: 7 (ref 5.0–7.5)

## 2021-12-11 MED ORDER — CEPHALEXIN 500 MG PO CAPS: 500.0000 mg | ORAL_CAPSULE | Freq: Two times a day (BID) | ORAL | 0 refills | Status: DC

## 2021-12-11 NOTE — Patient Instructions (Signed)
Preventing Hypoglycemia Hypoglycemia occurs when the level of sugar (glucose) in the blood is too low. Hypoglycemia can happen in people who do or do not have diabetes (diabetes mellitus). It can develop quickly, and it can be a medical emergency. For most people with diabetes, a blood glucose level below 70 mg/dL (3.9 mmol/L) is considered hypoglycemia. Glucose is a type of sugar that provides the body's main source of energy. Certain hormones (insulin and glucagon) control the level of glucose in the blood. Insulin lowers blood glucose, and glucagon increases blood glucose. Hypoglycemia can result from having too much insulin in the bloodstream, or from not eating enough food that contains glucose. Your risk for hypoglycemia is higher: If you take insulin or diabetes medicines to help lower your blood glucose or to help your body make more insulin. If you skip or delay a meal or snack. If you are ill. During and after exercise. You can prevent hypoglycemia by working with your health care provider to adjust your meal plan as needed and by taking other precautions. How can hypoglycemia affect me? Mild symptoms Mild hypoglycemia may not cause any symptoms. If you do have symptoms, they may include: Hunger. Sweating and feeling clammy. Dizziness or feeling light-headed. Sleepiness or restless sleep. Nausea. Increased heart rate. Headache. Blurry vision. Mood changes, including irritability or anxiety. Tingling or numbness around the mouth, lips, or tongue. If mild hypoglycemia is not recognized and treated, it can quickly become moderate or severe hypoglycemia. Moderate symptoms Moderate hypoglycemia can cause: Confusion and poor judgment. Behavior changes. Weakness. Irregular heartbeat. A change in coordination. Severe symptoms Severe hypoglycemia is a medical emergency. It can cause: Fainting. Seizures. Loss of consciousness (coma). Death. What nutrition changes can be  made? Work with your health care provider or dietitian to make a healthy meal plan that is right for you. Follow your meal plan carefully. Eat meals at regular times. If recommended by your health care provider, have snacks between meals. Donot skip or delay meals or snacks. You can be at risk for hypoglycemia if you are not getting enough carbohydrates. What lifestyle changes can be made?  Work closely with your health care provider to manage your blood glucose. Make sure you know: Your goal blood glucose levels. How and when to check your blood glucose. The symptoms of hypoglycemia. It is important to treat hypoglycemia right away to keep it from becoming severe. Do not drink alcohol on an empty stomach. When you are ill, check your blood glucose more often than usual. Make a sick day plan in advance with your health care provider. Follow this plan whenever you cannot eat or drink normally. Always check your blood glucose before, during, and after exercise. How is this treated? This condition can often be treated by immediately eating or drinking something that contains sugar with 15 grams of fast-acting carbohydrate, such as: 4 oz (120 mL) of fruit juice. 4 oz (120 mL) of regular soda (not diet soda). Several pieces of hard candy. Check food labels to find out how many pieces to eat for 15 grams. 1 Tbsp (15 mL) of sugar or honey. 4 glucose tablets. 1 tube of glucose gel. Treating hypoglycemia if you have diabetes If you are alert and able to swallow safely, follow the 15:15 rule: Take 15 grams of a fast-acting carbohydrate. Talk with your health care provider about how much you should take. Fast-acting options include: Glucose tablets (take 4 tablets). Several pieces of hard candy. Check food labels to find   out how many pieces to eat for 15 grams. 4 oz (120 mL) of fruit juice. 4 oz (120 mL) of regular soda (not diet soda). 1 Tbsp (15 mL) of sugar or honey. 1 tube of glucose  gel. Check your blood glucose 15 minutes after you take the carbohydrate. If the repeat blood glucose level is still at or below 70 mg/dL (3.9 mmol/L), take 15 grams of a carbohydrate again. If your blood glucose level does not increase above 70 mg/dL (3.9 mmol/L) after 3 tries, seek emergency medical care. After your blood glucose level returns to normal, eat a meal or a snack within 1 hour. Treating severe hypoglycemia Severe hypoglycemia is when your blood glucose level is below 54 mg/dL (3 mmol/L). Severe hypoglycemia is a medical emergency. Get medical help right away. If you have severe hypoglycemia and you cannot eat or drink, you may need glucagon. A family member or close friend should learn how to check your blood glucose and how to give you glucagon. Ask your health care provider if you need to have an emergency glucagon kit available. Severe hypoglycemia may need to be treated in a hospital. The treatment may include getting glucose through an IV. You may also need treatment for the cause of your hypoglycemia. Where to find more information American Diabetes Association: www.diabetes.org National Institute of Diabetes and Digestive and Kidney Diseases: www.niddk.nih.gov Association of Diabetes Care & Education Specialists: www.diabeteseducator.org Contact a health care provider if: You have problems keeping your blood glucose in your target range. You have frequent episodes of hypoglycemia. Get help right away if: You continue to have hypoglycemia symptoms after eating or drinking something containing glucose. Your blood glucose level is below 54 mg/dL (3 mmol/L). You faint. You have a seizure. These symptoms may represent a serious problem that is an emergency. Do not wait to see if the symptoms will go away. Get medical help right away. Call your local emergency services (911 in the [place] Do not drive yourself to the hospital. Summary Know the symptoms of hypoglycemia and when  you are at risk for it, such as during exercise or when you are sick. Check your blood glucose often when you are at risk for hypoglycemia. Hypoglycemia can develop quickly, and it can be dangerous if it is not treated right away. If you have a history of severe hypoglycemia, make sure your family or a close friend knows how to use your glucagon kit. Make sure you know how to treat hypoglycemia. Keep a fast-acting carbohydrate option available when you may be at risk for hypoglycemia. This information is not intended to replace advice given to you by your health care provider. Make sure you discuss any questions you have with your health care provider. Document Revised: 08/11/2020 Document Reviewed: 08/11/2020 Elsevier Patient Education  2022 [employer]  

## 2021-12-11 NOTE — Progress Notes (Signed)
? ?Established Patient Office Visit ? ?Subjective:  ?Patient ID: [name], female    DOB: Jan 12, 1951  Age: 71 y.o. MRN: [id number] ? ?CC:  ?Chief Complaint  ?Patient presents with  ? Medical Management of Chronic Issues  ? ? ?HPI ?[name] presents for chronic follow up.  ? ?1. DM ?Patient denies foot ulcerations, paresthesia of the feet, vomiting and weight loss. ?  ?Current diabetic medications include toujeo 52 units daily, novolog 8-30 units with meals TID, ozempic 1 mg ?Compliant with meds - Yes ?  ?Current monitoring regimen: home blood tests - CGM ?Home blood sugar records: varies 80-260s, mostly 140s ?Any episodes of hypoglycemia? has lows at night a few times a week, she wakes to the alarm on her monitor. She reports that her appetite is low and has been since having Covid. She often doesn't eat after 5 pm. She doesn't typically take insulin with this last meal.  ?  ?Eye exam current (within one year): yes ?Weight trend: stable ?  ?Is She on ACE inhibitor or angiotensin II receptor blocker?  ?No ?Is She on statin? Yes crestor ?  ?2. HTN ?Complaint with meds - Yes ?Current Medications - norvasc, atenolol, lasix, imdur ?Checking BP at home: yes, reports 130s/80s ?Pertinent ROS:  ?Headache - No ?Fatigue - No ?Visual Disturbances - No ?Chest pain - No ?Dyspnea - No ?Palpitations - No ?LE edema - No  ? ?3. Depression, anxiety ?She is taking Celexa daily. She feels like this related to the weather and the fact that she is having to stay in due to the weather. When the weather was nice a few weeks ago, she felt much better and does not desire any changes to medications at this time. ? ?Depression screen [facility] 2/9 12/11/2021 08/09/2021 02/23/2021  ?Decreased Interest 1 0 2  ?Down, Depressed, Hopeless 1 0 1  ?PHQ - 2 Score 2 0 3  ?Altered sleeping 3 0 3  ?Tired, decreased energy '3 2 3  '$ ?Change in appetite 3 3 0  ?Feeling bad or failure about yourself  0 0 0  ?Trouble concentrating 0 0 0  ?Moving  slowly or fidgety/restless 0 0 1  ?Suicidal thoughts 0 0 0  ?PHQ-9 Score '11 5 10  '$ ?Difficult doing work/chores Somewhat difficult Somewhat difficult Somewhat difficult  ?Some recent data might be hidden  ? ?GAD 7 : Generalized Anxiety Score 12/11/2021 08/09/2021 02/23/2021 01/09/2021  ?Nervous, Anxious, on Edge '1 1 3 1  '$ ?Control/stop worrying 1 0 1 0  ?Worry too much - different things 1 0 1 0  ?Trouble relaxing 1 0 3 0  ?Restless 0 0 0 0  ?Easily annoyed or irritable 3 0 0 0  ?Afraid - awful might happen 0 0 0 0  ?Total GAD 7 Score '7 1 8 1  '$ ?Anxiety Difficulty Somewhat difficult Not difficult at all Somewhat difficult Somewhat difficult  ? ?4. Urinary odor ?She reports an odor x 1 week. She also reports some itching. She has used vagisil OTC with some relief. She denies dysuria, fever, flank pain, nausea, vomiting, or discharge. Denies hematuria. She has a hx of frequent UTIs.  ? ?Past Medical History:  ?Diagnosis Date  ? Anxiety   ? CAD (coronary artery disease)   ? DES to circumflex 02/2007, BMS to LAD and PTCA diagonal 03/2007  ? Carotid artery plaque   ? Mild  ? Cataract   ? Depression   ? Diverticulitis, colon   ? Elevated  d-dimer 01/08/2014  ? Essential hypertension, benign   ? GERD (gastroesophageal reflux disease)   ? H/O hiatal hernia   ? HLD (hyperlipidemia)   ? IDDM (insulin dependent diabetes mellitus)   ? Migraine   ? "used to have them really bad; don't have them anymore" (01/07/2014)  ? MS (multiple sclerosis) ([place])   ? Not confirmed  ? PAT (paroxysmal atrial tachycardia) ([place])   ? Prolapse of uterus   ? PVD (peripheral vascular disease) ([place])   ? TIA (transient ischemic attack) 1980's  ? ? ?Past Surgical History:  ?Procedure Laterality Date  ? ABDOMINAL HYSTERECTOMY  1986  ? ovaries remain - prolaspe uterus   ? APPENDECTOMY  ~ 1970  ? BREAST BIOPSY Right 1980's  ? BREAST LUMPECTOMY Right 1980's  ? Dr. Charlynne Pander   ? CARDIAC CATHETERIZATION  01/07/2014  ? CHOLECYSTECTOMY  ?1987  ? COLONOSCOPY  2002  ? Dr.  Rowe Pavy Severe diverticular changes in the region of the sigmoid and descending colon with scattered diverticular changes throughout the rest of the colon. No polyps, ulcerations. Despite numerous manipulations, the tip of the scope could not be tipped into the cecal area.  ? COLONOSCOPY  01/10/2012  ? Procedure: COLONOSCOPY;  Surgeon: Daneil Dolin, MD;  [facility];  Service: Endoscopy;  Laterality: N/A;  1:55  ? CORONARY ANGIOPLASTY WITH STENT PLACEMENT  ~ 1997 X 2  ? "2 + 1"  ? EYE SURGERY Bilateral 2014  ? cataract  ? INTRAVASCULAR PRESSURE WIRE/FFR STUDY N/A 03/08/2017  ? Procedure: Intravascular Pressure Wire/FFR Study;  Surgeon: Nelva Bush, MD;  Location: [employer] CV LAB;  Service: Cardiovascular;  Laterality: N/A;  ? LEFT HEART CATH AND CORONARY ANGIOGRAPHY N/A 03/08/2017  ? Procedure: Left Heart Cath and Coronary Angiography;  Surgeon: Nelva Bush, MD;  Location: [place] CV LAB;  Service: Cardiovascular;  Laterality: N/A;  ? LEFT HEART CATHETERIZATION WITH CORONARY ANGIOGRAM N/A 01/07/2014  ? Procedure: LEFT HEART CATHETERIZATION WITH CORONARY ANGIOGRAM;  Surgeon: Larey Dresser, MD;  Location: [facility] CATH LAB;  Service: Cardiovascular;  Laterality: N/A;  ? ? ?Family History  ?Problem Relation Age of Onset  ? Heart attack Mother 42  ? Diabetes Mother   ? Hypertension Mother   ? Heart attack Father 79  ? Heart attack Brother 32  ?     x 6  ? Heart disease Brother   ? Diabetes Brother   ? Colon cancer Paternal Aunt   ?     23s, died with brain anuerysm  ? Crohn's disease Cousin   ?     paternal  ? Diabetes Sister   ? GER disease Daughter   ? Cervical cancer Daughter   ? Diabetes Daughter   ? ? ?Social History  ? ?Socioeconomic History  ? Marital status: Widowed  ?  Spouse name: Not on file  ? Number of children: 4  ? Years of education: 54  ? Highest education level: 11th grade  ?Occupational History  ? Occupation: Disability  ?  Employer: DISABLED  ?Tobacco Use  ? Smoking status:  Never  ? Smokeless tobacco: Never  ? Tobacco comments:  ?  spouse, 36 years - husband has quit 01/2011  ?Vaping Use  ? Vaping Use: Never used  ?Substance and Sexual Activity  ? Alcohol use: No  ? Drug use: No  ? Sexual activity: Not Currently  ?Other Topics Concern  ? Not on file  ?Social History Narrative  ? Lives alone, one  level, handicap accessible bathroom, her children all live nearby  ? ?Social Determinants of Health  ? ?Financial Resource Strain: Low Risk   ? Difficulty of Paying Living Expenses: Not very hard  ?Food Insecurity: No Food Insecurity  ? Worried About Charity fundraiser in the Last Year: Never true  ? Ran Out of Food in the Last Year: Never true  ?Transportation Needs: No Transportation Needs  ? Lack of Transportation (Medical): No  ? Lack of Transportation (Non-Medical): No  ?Physical Activity: Inactive  ? Days of Exercise per Week: 0 days  ? Minutes of Exercise per Session: 0 min  ?Stress: No Stress Concern Present  ? Feeling of Stress : Only a little  ?Social Connections: Moderately Integrated  ? Frequency of Communication with Friends and Family: More than three times a week  ? Frequency of Social Gatherings with Friends and Family: More than three times a week  ? Attends Religious Services: More than 4 times per year  ? Active Member of Clubs or Organizations: Yes  ? Attends Archivist Meetings: More than 4 times per year  ? Marital Status: Widowed  ?Intimate Partner Violence: Not At Risk  ? Fear of Current or Ex-Partner: No  ? Emotionally Abused: No  ? Physically Abused: No  ? Sexually Abused: No  ? ? ?Outpatient Medications Prior to Visit  ?Medication Sig Dispense Refill  ? amLODipine (NORVASC) 10 MG tablet Take 1 tablet (10 mg total) by mouth daily. 30 tablet 5  ? aspirin 325 MG tablet Take 325 mg by mouth daily.    ? atenolol (TENORMIN) 50 MG tablet Take 1 tablet (50 mg total) by mouth 2 (two) times daily. 180 tablet 3  ? calcium carbonate (OS-CAL) 600 MG TABS tablet Take 1  tablet (600 mg total) by mouth 2 (two) times daily with a meal. 180 tablet 3  ? citalopram (CELEXA) 40 MG tablet Take 40 mg by mouth daily.    ? Continuous Blood Gluc Receiver (FREESTYLE LIBRE 2 READER) DEVI U

## 2021-12-12 LAB — THYROID PANEL WITH TSH
Free Thyroxine Index: 1.7 (ref 1.2–4.9)
T3 Uptake Ratio: 27 % (ref 24–39)
T4, Total: 6.3 ug/dL (ref 4.5–12.0)
TSH: 2.11 u[IU]/mL (ref 0.450–4.500)

## 2021-12-12 LAB — CBC WITH DIFFERENTIAL/PLATELET
Basophils Absolute: 0 10*3/uL (ref 0.0–0.2)
Basos: 0 %
EOS (ABSOLUTE): 0.3 10*3/uL (ref 0.0–0.4)
Eos: 4 %
Hematocrit: 35.5 % (ref 34.0–46.6)
Hemoglobin: 12.1 g/dL (ref 11.1–15.9)
Immature Grans (Abs): 0 10*3/uL (ref 0.0–0.1)
Immature Granulocytes: 0 %
Lymphocytes Absolute: 2.2 10*3/uL (ref 0.7–3.1)
Lymphs: 30 %
MCH: 29.8 pg (ref 26.6–33.0)
MCHC: 34.1 g/dL (ref 31.5–35.7)
MCV: 87 fL (ref 79–97)
Monocytes Absolute: 0.4 10*3/uL (ref 0.1–0.9)
Monocytes: 6 %
Neutrophils Absolute: 4.4 10*3/uL (ref 1.4–7.0)
Neutrophils: 60 %
Platelets: 178 10*3/uL (ref 150–450)
RBC: 4.06 x10E6/uL (ref 3.77–5.28)
RDW: 13.2 % (ref 11.7–15.4)
WBC: 7.3 10*3/uL (ref 3.4–10.8)

## 2021-12-12 LAB — CMP14+EGFR
ALT: 8 IU/L (ref 0–32)
AST: 17 IU/L (ref 0–40)
Albumin/Globulin Ratio: 1.7 (ref 1.2–2.2)
Albumin: 4.2 g/dL (ref 3.7–4.7)
Alkaline Phosphatase: 54 IU/L (ref 44–121)
BUN/Creatinine Ratio: 11 — ABNORMAL LOW (ref 12–28)
BUN: 10 mg/dL (ref 8–27)
Bilirubin Total: 0.4 mg/dL (ref 0.0–1.2)
CO2: 25 mmol/L (ref 20–29)
Calcium: 9.3 mg/dL (ref 8.7–10.3)
Chloride: 100 mmol/L (ref 96–106)
Creatinine, Ser: 0.94 mg/dL (ref 0.57–1.00)
Globulin, Total: 2.5 g/dL (ref 1.5–4.5)
Glucose: 270 mg/dL — ABNORMAL HIGH (ref 70–99)
Potassium: 3.9 mmol/L (ref 3.5–5.2)
Sodium: 139 mmol/L (ref 134–144)
Total Protein: 6.7 g/dL (ref 6.0–8.5)
eGFR: 64 mL/min/{1.73_m2} (ref >59)

## 2021-12-15 LAB — URINE CULTURE

## 2021-12-19 ENCOUNTER — Encounter: Payer: Self-pay | Admitting: *Deleted

## 2021-12-21 DIAGNOSIS — Z881 Allergy status to other antibiotic agents status: Secondary | ICD-10-CM | POA: Diagnosis not present

## 2021-12-21 DIAGNOSIS — H2513 Age-related nuclear cataract, bilateral: Secondary | ICD-10-CM | POA: Diagnosis not present

## 2021-12-21 DIAGNOSIS — L03116 Cellulitis of left lower limb: Secondary | ICD-10-CM | POA: Diagnosis not present

## 2021-12-21 DIAGNOSIS — H40033 Anatomical narrow angle, bilateral: Secondary | ICD-10-CM | POA: Diagnosis not present

## 2021-12-21 DIAGNOSIS — R21 Rash and other nonspecific skin eruption: Secondary | ICD-10-CM | POA: Diagnosis not present

## 2021-12-21 DIAGNOSIS — Z87440 Personal history of urinary (tract) infections: Secondary | ICD-10-CM | POA: Diagnosis not present

## 2021-12-24 ENCOUNTER — Emergency Department (HOSPITAL_COMMUNITY): Payer: PPO

## 2021-12-24 ENCOUNTER — Other Ambulatory Visit: Payer: Self-pay

## 2021-12-24 ENCOUNTER — Emergency Department (HOSPITAL_COMMUNITY)
Admission: EM | Admit: 2021-12-24 | Discharge: 2021-12-24 | Disposition: A | Discharge status: Home / Self Care | Payer: PPO | Attending: Emergency Medicine | Admitting: Emergency Medicine

## 2021-12-24 ENCOUNTER — Encounter (HOSPITAL_COMMUNITY): Payer: Self-pay | Admitting: Emergency Medicine

## 2021-12-24 DIAGNOSIS — E119 Type 2 diabetes mellitus without complications: Secondary | ICD-10-CM | POA: Diagnosis not present

## 2021-12-24 DIAGNOSIS — I10 Essential (primary) hypertension: Secondary | ICD-10-CM | POA: Diagnosis not present

## 2021-12-24 DIAGNOSIS — Z20822 Contact with and (suspected) exposure to covid-19: Secondary | ICD-10-CM | POA: Diagnosis not present

## 2021-12-24 DIAGNOSIS — M791 Myalgia, unspecified site: Secondary | ICD-10-CM | POA: Insufficient documentation

## 2021-12-24 DIAGNOSIS — R52 Pain, unspecified: Secondary | ICD-10-CM

## 2021-12-24 DIAGNOSIS — R531 Weakness: Secondary | ICD-10-CM | POA: Diagnosis not present

## 2021-12-24 DIAGNOSIS — R5383 Other fatigue: Secondary | ICD-10-CM | POA: Insufficient documentation

## 2021-12-24 DIAGNOSIS — I2510 Atherosclerotic heart disease of native coronary artery without angina pectoris: Secondary | ICD-10-CM | POA: Diagnosis not present

## 2021-12-24 DIAGNOSIS — R5381 Other malaise: Secondary | ICD-10-CM | POA: Diagnosis not present

## 2021-12-24 LAB — URINALYSIS, ROUTINE W REFLEX MICROSCOPIC
Bacteria, UA: NONE SEEN
Bilirubin Urine: NEGATIVE
Glucose, UA: >=500 mg/dL — AB
Ketones, ur: 5 mg/dL — AB
Leukocytes,Ua: NEGATIVE
Nitrite: NEGATIVE
Protein, ur: NEGATIVE mg/dL
Specific Gravity, Urine: 1.021 (ref 1.005–1.030)
pH: 7 (ref 5.0–8.0)

## 2021-12-24 LAB — COMPREHENSIVE METABOLIC PANEL
ALT: 15 U/L (ref 0–44)
AST: 18 U/L (ref 15–41)
Albumin: 3.4 g/dL — ABNORMAL LOW (ref 3.5–5.0)
Alkaline Phosphatase: 52 U/L (ref 38–126)
Anion gap: 11 (ref 5–15)
BUN: 20 mg/dL (ref 8–23)
CO2: 28 mmol/L (ref 22–32)
Calcium: 9.1 mg/dL (ref 8.9–10.3)
Chloride: 98 mmol/L (ref 98–111)
Creatinine, Ser: 0.94 mg/dL (ref 0.44–1.00)
GFR, Estimated: >60 mL/min (ref >60)
Glucose, Bld: 296 mg/dL — ABNORMAL HIGH (ref 70–99)
Potassium: 3.1 mmol/L — ABNORMAL LOW (ref 3.5–5.1)
Sodium: 137 mmol/L (ref 135–145)
Total Bilirubin: 0.7 mg/dL (ref 0.3–1.2)
Total Protein: 7.2 g/dL (ref 6.5–8.1)

## 2021-12-24 LAB — CBC
HCT: 35.3 % — ABNORMAL LOW (ref 36.0–46.0)
Hemoglobin: 11.8 g/dL — ABNORMAL LOW (ref 12.0–15.0)
MCH: 29.6 pg (ref 26.0–34.0)
MCHC: 33.4 g/dL (ref 30.0–36.0)
MCV: 88.5 fL (ref 80.0–100.0)
Platelets: 167 10*3/uL (ref 150–400)
RBC: 3.99 MIL/uL (ref 3.87–5.11)
RDW: 13.2 % (ref 11.5–15.5)
WBC: 8.2 10*3/uL (ref 4.0–10.5)
nRBC: 0 % (ref 0.0–0.2)

## 2021-12-24 LAB — RESP PANEL BY RT-PCR (FLU A&B, COVID) ARPGX2
Influenza A by PCR: NEGATIVE
Influenza B by PCR: NEGATIVE
SARS Coronavirus 2 by RT PCR: NEGATIVE

## 2021-12-24 LAB — CK: Total CK: 92 U/L (ref 38–234)

## 2021-12-24 LAB — MAGNESIUM: Magnesium: 1.6 mg/dL — ABNORMAL LOW (ref 1.7–2.4)

## 2021-12-24 MED ORDER — DEXAMETHASONE SODIUM PHOSPHATE 10 MG/ML IJ SOLN
10.0000 mg | Freq: Once | INTRAMUSCULAR | Status: AC
  Administered 2021-12-24: 10 mg via INTRAVENOUS
  Filled 2021-12-24: qty 1

## 2021-12-24 NOTE — ED Provider Notes (Signed)
9:28 AM ?Care of the patient assumed at signout.  Now on discussing lab results she is accompanied by her son.  We discussed all findings.  Patient is COVID-negative, flu negative, no leukocytosis, has some hemoglobin urea, and hyperglycemia.  She notes that this is her version of normal, where she was previously substantially more hyperglycemic typically.  She complains of soreness in multiple extremities, notes a history of arthritis.  We discussed this, possibilities for her diffuse soreness, but she continues to move all extremities spontaneously is awake, alert, afebrile, in no distress.  Patient will receive additional anti-inflammatories, continue her doxycycline, follow-up with primary care. ?  ?Carmin Muskrat, MD ?12/24/21 [phone] ? ?

## 2021-12-24 NOTE — Discharge Instructions (Signed)
As discussed, your evaluation today has been largely reassuring.  But, it is important that you monitor your condition carefully, and do not hesitate to return to the ED if you develop new, or concerning changes in your condition. ? ?Otherwise, please follow-up with your physician for appropriate ongoing care. ? ?

## 2021-12-24 NOTE — ED Provider Notes (Signed)
?[place] DEPT ?[facility] ?Provider Note ?MRN:  [id number]  ?Arrival date & time: 12/24/21    ? ?Chief Complaint   ?Weakness ?  ?History of Present Illness   ?[name] is a 71 y.o. year-old female with a history of CAD presenting to the ED with chief complaint of weakness. ? ?Patient endorsing weakness, general malaise, fatigue, explaining that "every inch of my body hurts".  Denies focal numbness or weakness to the arms or legs, more of a global weakness.  Denies headache, no vision change, no trouble with speech, no trouble swallowing.  No neck or back pain.  No chest pain or shortness of breath, no abdominal tenderness.  Aches more in the muscles and joints.  Fever up to 101 yesterday.  No cough, no cold-like symptoms, no burning with urination.  Was diagnosed with a cellulitis of the foot yesterday and started on doxycycline. ? ?Review of Systems  ?A thorough review of systems was obtained and all systems are negative except as noted in the HPI and PMH.  ? ?Patient's Health History   ? ?Past Medical History:  ?Diagnosis Date  ? Anxiety   ? CAD (coronary artery disease)   ? DES to circumflex 02/2007, BMS to LAD and PTCA diagonal 03/2007  ? Carotid artery plaque   ? Mild  ? Cataract   ? Depression   ? Diverticulitis, colon   ? Elevated d-dimer 01/08/2014  ? Essential hypertension, benign   ? GERD (gastroesophageal reflux disease)   ? H/O hiatal hernia   ? HLD (hyperlipidemia)   ? IDDM (insulin dependent diabetes mellitus)   ? Migraine   ? "used to have them really bad; don't have them anymore" (01/07/2014)  ? MS (multiple sclerosis) ([place])   ? Not confirmed  ? PAT (paroxysmal atrial tachycardia) ([place])   ? Prolapse of uterus   ? PVD (peripheral vascular disease) ([place])   ? TIA (transient ischemic attack) 1980's  ?  ?Past Surgical History:  ?Procedure Laterality Date  ? ABDOMINAL HYSTERECTOMY  1986  ? ovaries remain - prolaspe uterus   ? APPENDECTOMY  ~ 1970  ? BREAST BIOPSY  Right 1980's  ? BREAST LUMPECTOMY Right 1980's  ? Dr. Charlynne Pander   ? CARDIAC CATHETERIZATION  01/07/2014  ? CHOLECYSTECTOMY  ?1987  ? COLONOSCOPY  2002  ? Dr. Rowe Pavy Severe diverticular changes in the region of the sigmoid and descending colon with scattered diverticular changes throughout the rest of the colon. No polyps, ulcerations. Despite numerous manipulations, the tip of the scope could not be tipped into the cecal area.  ? COLONOSCOPY  01/10/2012  ? Procedure: COLONOSCOPY;  Surgeon: Daneil Dolin, MD;  [facility];  Service: Endoscopy;  Laterality: N/A;  1:55  ? CORONARY ANGIOPLASTY WITH STENT PLACEMENT  ~ 1997 X 2  ? "2 + 1"  ? EYE SURGERY Bilateral 2014  ? cataract  ? INTRAVASCULAR PRESSURE WIRE/FFR STUDY N/A 03/08/2017  ? Procedure: Intravascular Pressure Wire/FFR Study;  Surgeon: Nelva Bush, MD;  Location: [place] CV LAB;  Service: Cardiovascular;  Laterality: N/A;  ? LEFT HEART CATH AND CORONARY ANGIOGRAPHY N/A 03/08/2017  ? Procedure: Left Heart Cath and Coronary Angiography;  Surgeon: Nelva Bush, MD;  Location: [place] CV LAB;  Service: Cardiovascular;  Laterality: N/A;  ? LEFT HEART CATHETERIZATION WITH CORONARY ANGIOGRAM N/A 01/07/2014  ? Procedure: LEFT HEART CATHETERIZATION WITH CORONARY ANGIOGRAM;  Surgeon: Larey Dresser, MD;  Location: [facility] CATH LAB;  Service:  Cardiovascular;  Laterality: N/A;  ?  ?Family History  ?Problem Relation Age of Onset  ? Heart attack Mother 6  ? Diabetes Mother   ? Hypertension Mother   ? Heart attack Father 88  ? Heart attack Brother 32  ?     x 6  ? Heart disease Brother   ? Diabetes Brother   ? Colon cancer Paternal Aunt   ?     13s, died with brain anuerysm  ? Crohn's disease Cousin   ?     paternal  ? Diabetes Sister   ? GER disease Daughter   ? Cervical cancer Daughter   ? Diabetes Daughter   ?  ?Social History  ? ?Socioeconomic History  ? Marital status: Widowed  ?  Spouse name: Not on file  ? Number of children: 4  ? Years of  education: 60  ? Highest education level: 11th grade  ?Occupational History  ? Occupation: Disability  ?  Employer: DISABLED  ?Tobacco Use  ? Smoking status: Never  ? Smokeless tobacco: Never  ? Tobacco comments:  ?  spouse, 86 years - husband has quit 01/2011  ?Vaping Use  ? Vaping Use: Never used  ?Substance and Sexual Activity  ? Alcohol use: No  ? Drug use: No  ? Sexual activity: Not Currently  ?Other Topics Concern  ? Not on file  ?Social History Narrative  ? Lives alone, one level, handicap accessible bathroom, her children all live nearby  ? ?Social Determinants of Health  ? ?Financial Resource Strain: Low Risk   ? Difficulty of Paying Living Expenses: Not very hard  ?Food Insecurity: No Food Insecurity  ? Worried About Charity fundraiser in the Last Year: Never true  ? Ran Out of Food in the Last Year: Never true  ?Transportation Needs: No Transportation Needs  ? Lack of Transportation (Medical): No  ? Lack of Transportation (Non-Medical): No  ?Physical Activity: Inactive  ? Days of Exercise per Week: 0 days  ? Minutes of Exercise per Session: 0 min  ?Stress: No Stress Concern Present  ? Feeling of Stress : Only a little  ?Social Connections: Moderately Integrated  ? Frequency of Communication with Friends and Family: More than three times a week  ? Frequency of Social Gatherings with Friends and Family: More than three times a week  ? Attends Religious Services: More than 4 times per year  ? Active Member of Clubs or Organizations: Yes  ? Attends Archivist Meetings: More than 4 times per year  ? Marital Status: Widowed  ?Intimate Partner Violence: Not At Risk  ? Fear of Current or Ex-Partner: No  ? Emotionally Abused: No  ? Physically Abused: No  ? Sexually Abused: No  ?  ? ?Physical Exam  ? ?Vitals:  ? 12/24/21 0602 12/24/21 0603  ?BP:  (!) 170/80  ?Pulse: 99   ?Resp:    ?Temp:    ?SpO2: 98%   ?  ?CONSTITUTIONAL: Well-appearing, NAD ?NEURO/PSYCH:  Alert and oriented x 3, no focal  deficits ?EYES:  eyes equal and reactive ?ENT/NECK:  no LAD, no JVD ?CARDIO: Regular rate, well-perfused, normal S1 and S2 ?PULM:  CTAB no wheezing or rhonchi ?GI/GU:  non-distended, non-tender ?MSK/SPINE:  No gross deformities, no edema ?SKIN:  no rash, atraumatic ? ? ?*Additional and/or pertinent findings included in MDM below ? ?Diagnostic and Interventional Summary  ? ? EKG Interpretation ? ?Date/Time:  Sunday December 24 2021 06:04:12 EDT ?Ventricular Rate:  103 ?  PR Interval:  179 ?QRS Duration: 90 ?QT Interval:  372 ?QTC Calculation: 487 ?R Axis:   -27 ?Text Interpretation: Sinus tachycardia Consider left atrial enlargement Inferior infarct, old Consider anterior infarct Baseline wander in lead(s) V6 Confirmed by Gerlene Fee [phone]) on 12/24/2021 6:25:26 AM ?  ? ?  ? ?Labs Reviewed  ?RESP PANEL BY RT-PCR (FLU A&B, COVID) ARPGX2  ?CBC  ?COMPREHENSIVE METABOLIC PANEL  ?MAGNESIUM  ?CK  ?URINALYSIS, ROUTINE W REFLEX MICROSCOPIC  ?  ?DG Chest Port 1 View    (Results Pending)  ?  ?Medications - No data to display  ? ?Procedures  /  Critical Care ?Procedures ? ?ED Course and Medical Decision Making  ?Initial Impression and Ddx ?Body aches, vague symptoms of malaise, fatigue, weakness.  Vital signs are normal.  I see no evidence of cellulitis on my exam of the lower extremities and feet.  Abdomen soft nontender, no increased work of breathing, lungs clear.  Differential diagnosis includes electrolyte disturbance, rhabdomyolysis, viral illness, UTI, pneumonia.  Awaiting labs, urinalysis, x-ray. ? ?Past medical/surgical history that increases complexity of ED encounter: History of CAD, history of recent UTI requiring admission ? ?Interpretation of Diagnostics ?I personally reviewed the EKG and my interpretation is as follows: Sinus rhythm ?   ?Awaiting labs ? ?Patient Reassessment and Ultimate Disposition/Management ?Signed out to oncoming provider.  If reassuring work-up would be appropriate for discharge. ? ?Patient  management required discussion with the following services or consulting groups:  None ? ?Complexity of Problems Addressed ?Acute illness or injury that poses threat of life of bodily function ? ?Additional Data Reviewed and

## 2021-12-24 NOTE — ED Triage Notes (Addendum)
Pt with c/o generalized weakness. States joints are "sore".  States was told she has arthritis. Pt states she is on ABX for cellulitis. ?

## 2021-12-24 NOTE — ED Notes (Signed)
Pt refused chest xray r/t diabetes monitor  ?

## 2021-12-26 ENCOUNTER — Telehealth: Payer: Self-pay | Admitting: Family Medicine

## 2021-12-26 NOTE — Telephone Encounter (Signed)
Pt scheduled with Tiffany tomorrow 12/27/21 at 11:15 for follow up. ?

## 2021-12-27 ENCOUNTER — Ambulatory Visit (INDEPENDENT_AMBULATORY_CARE_PROVIDER_SITE_OTHER): Payer: PPO | Admitting: Family Medicine

## 2021-12-27 ENCOUNTER — Encounter: Payer: Self-pay | Admitting: Family Medicine

## 2021-12-27 VITALS — BP 160/90 | HR 99 | Temp 98.1°F | Ht 66.0 in | Wt 153.0 lb

## 2021-12-27 DIAGNOSIS — R319 Hematuria, unspecified: Secondary | ICD-10-CM

## 2021-12-27 DIAGNOSIS — M7918 Myalgia, other site: Secondary | ICD-10-CM

## 2021-12-27 DIAGNOSIS — M791 Myalgia, unspecified site: Secondary | ICD-10-CM

## 2021-12-27 DIAGNOSIS — M2550 Pain in unspecified joint: Secondary | ICD-10-CM

## 2021-12-27 DIAGNOSIS — E876 Hypokalemia: Secondary | ICD-10-CM

## 2021-12-27 LAB — URINALYSIS, ROUTINE W REFLEX MICROSCOPIC
Bilirubin, UA: NEGATIVE
Glucose, UA: NEGATIVE
Ketones, UA: NEGATIVE
Leukocytes,UA: NEGATIVE
Nitrite, UA: NEGATIVE
Protein,UA: NEGATIVE
Specific Gravity, UA: 1.025 (ref 1.005–1.030)
Urobilinogen, Ur: 0.2 mg/dL (ref 0.2–1.0)
pH, UA: 6 (ref 5.0–7.5)

## 2021-12-27 LAB — MICROSCOPIC EXAMINATION
Bacteria, UA: NONE SEEN
Renal Epithel, UA: NONE SEEN /hpf

## 2021-12-27 MED ORDER — MELOXICAM 15 MG PO TABS: 15.0000 mg | ORAL_TABLET | Freq: Every day | ORAL | 0 refills | Status: DC

## 2021-12-27 MED ORDER — PREDNISONE 10 MG PO TABS: 10.0000 mg | ORAL_TABLET | Freq: Every day | ORAL | 0 refills | Status: DC

## 2021-12-27 MED ORDER — METHYLPREDNISOLONE ACETATE 80 MG/ML IJ SUSP
80.0000 mg | Freq: Once | INTRAMUSCULAR | Status: AC
  Administered 2021-12-27: 80 mg via INTRAMUSCULAR

## 2021-12-27 NOTE — Patient Instructions (Signed)
Joint Pain ?Joint pain may be caused by many things. Joint pain is likely to go away when you follow instructions from your health care provider for relieving pain at home. However, joint pain can also be caused by conditions that require more treatment. Common causes of joint pain include: ?Bruising in the area of the joint. ?Injury caused by repeating certain movements too many times. ?Age-related joint wear and tear. ?Buildup of uric acid crystals in the joint (gout). ?Inflammation of the joint. ?Other forms of arthritis. ?Infections of the joint or of the bone. ?Your health care provider may recommend that you take pain medicine or wear a supportive device like an elastic bandage, sling, or splint. If your joint pain continues, you may need lab or imaging tests to diagnose the cause of your joint pain. ?Follow these instructions at home: ?Managing pain, stiffness, and swelling ?  ?If directed, put ice on the painful area. To do this: ?If you have a removable elastic bandage, sling, or splint, take it off as told by your doctor. ?Put ice in a plastic bag. ?Place a towel between your skin and the bag. ?Leave the ice on for 20 minutes, 2-3 times a day. ?Remove the ice if your skin turns bright red. This is very important. If you cannot feel pain, heat, or cold, you have a greater risk of damage to the area. ?Move your fingers and toes often to reduce stiffness and swelling. ?Raise the injured area above the level of your heart while you are sitting or lying down. ?If directed, apply heat to the painful area as often as told by your health care provider. Use the heat source that your health care provider recommends, such as a moist heat pack or a heating pad. ?Place a towel between your skin and the heat source. ?Leave the heat on for 20-30 minutes. ?Remove the heat if your skin turns bright red. This is especially important if you are unable to feel pain, heat, or cold. You have a greater risk of getting  burned. ? ?Activity ?Rest as told by your health care provider. Do not do anything that causes or worsens pain. ?Begin exercising or stretching the affected area as told by your health care provider. ?Return to your normal activities as told by your health care provider. Ask your health care provider what activities are safe for you. ?If you have an elastic bandage, sling, or splint: ?Wear the bandage, sling, or splint as told by your health care provider. Remove it only as told by your health care provider. ?Loosen it if your fingers or toes below the joint tingle, become numb, or turn cold and blue. ?Keep it clean. ?Ask your health care provider if you should remove it before bathing. ?If the bandage, sling, or splint is not waterproof: ?Do not let it get wet. ?Cover it with a watertight covering when you take a bath or shower. ?General instructions ?Treatment may include medicines for pain and inflammation that are taken by mouth or applied to the skin. Take over-the-counter and prescription medicines only as told by your health care provider. ?Do not use any products that contain nicotine or tobacco, such as cigarettes, e-cigarettes, and chewing tobacco. If you need help quitting, ask your health care provider. ?Keep all follow-up visits. This is important. ?Contact a health care provider if: ?You have pain that gets worse and does not get better with medicine. ?Your joint pain does not improve within 3 days. ?You have increased bruising or  swelling. ?You have a fever. ?You lose 10 lb (4.5 kg) or more without trying. ?Get help right away if: ?You cannot move the joint. ?Your fingers or toes tingle, become numb, or turn cold and blue. ?You have a fever along with a joint that is red, warm, and swollen. ?Summary ?Joint pain may be caused by many things. ?Your health care provider may recommend that you take pain medicine or wear a supportive device such as an elastic bandage, sling, or splint. ?If your joint pain  continues, you may need tests to diagnose the cause of your joint pain. ?Take over-the-counter and prescription medicines only as told by your health care provider. ?This information is not intended to replace advice given to you by your health care provider. Make sure you discuss any questions you have with your health care provider. ?Document Revised: 12/23/2019 Document Reviewed: 12/23/2019 ?Elsevier Patient Education ? [place]. ? ?

## 2021-12-27 NOTE — Progress Notes (Signed)
? ?Acute Office Visit ? ?Subjective:  ? ? Patient ID: [name], female    DOB: Feb 21, 1951, 71 y.o.   MRN: [id number] ? ?Chief Complaint  ?Patient presents with  ? Joint Swelling  ? ? ?HPI ?Patient is in today for joint pain x 1 week. The pain is in all of her joints and on both sides. Her joints also feel swollen and stiff. This started suddenly and started with her left knee and by the next day it was in all of her joints. She called her nurse from [facility] and was told that she had cellulitis because her ankles were red and swollen. She did have a fever up to 101 on that day when the nurse visited. The nurse did have a virtual visit with a provider and was diagnosed with cellulitis and given doxycyline for treatment. The joint and muscle pain continued to worsen to the point that she had difficulty moving. Her son took her to the ER on 12/24/21. She had a negative flu and covid test. She did have some blood in her urine and her potasium and magnesium were slightly low. She was given some steroids IV and her symptoms improved until the following night. Again her joint pain and muscle has continued to worsen. She having a had time walking, having a hard time with her grip, and is unable to raises to arm above her shoulders. She is struggling to do her ADLs because of this. She also reports fatigue. She did finished the doxycyline. She has been staying well hydrated. She has tried a heating pad, muscle rubs, and aspirin cream without improvement. Denies fever, cough, congestion, shortness of breath, chest pain, or focal weakness.  ? ?Past Medical History:  ?Diagnosis Date  ? Anxiety   ? CAD (coronary artery disease)   ? DES to circumflex 02/2007, BMS to LAD and PTCA diagonal 03/2007  ? Carotid artery plaque   ? Mild  ? Cataract   ? Depression   ? Diverticulitis, colon   ? Elevated d-dimer 01/08/2014  ? Essential hypertension, benign   ? GERD (gastroesophageal reflux disease)   ? H/O hiatal hernia   ? HLD  (hyperlipidemia)   ? IDDM (insulin dependent diabetes mellitus)   ? Migraine   ? "used to have them really bad; don't have them anymore" (01/07/2014)  ? MS (multiple sclerosis) ([place])   ? Not confirmed  ? PAT (paroxysmal atrial tachycardia) ([place])   ? Prolapse of uterus   ? PVD (peripheral vascular disease) ([place])   ? TIA (transient ischemic attack) 1980's  ? ? ?Past Surgical History:  ?Procedure Laterality Date  ? ABDOMINAL HYSTERECTOMY  1986  ? ovaries remain - prolaspe uterus   ? APPENDECTOMY  ~ 1970  ? BREAST BIOPSY Right 1980's  ? BREAST LUMPECTOMY Right 1980's  ? Dr. Charlynne Pander   ? CARDIAC CATHETERIZATION  01/07/2014  ? CHOLECYSTECTOMY  ?1987  ? COLONOSCOPY  2002  ? Dr. Rowe Pavy Severe diverticular changes in the region of the sigmoid and descending colon with scattered diverticular changes throughout the rest of the colon. No polyps, ulcerations. Despite numerous manipulations, the tip of the scope could not be tipped into the cecal area.  ? COLONOSCOPY  01/10/2012  ? Procedure: COLONOSCOPY;  Surgeon: Daneil Dolin, MD;  [facility];  Service: Endoscopy;  Laterality: N/A;  1:55  ? CORONARY ANGIOPLASTY WITH STENT PLACEMENT  ~ 1997 X 2  ? "2 + 1"  ? EYE SURGERY Bilateral  2014  ? cataract  ? INTRAVASCULAR PRESSURE WIRE/FFR STUDY N/A 03/08/2017  ? Procedure: Intravascular Pressure Wire/FFR Study;  Surgeon: Nelva Bush, MD;  Location: [facility];  Service: Cardiovascular;  Laterality: N/A;  ? LEFT HEART CATH AND CORONARY ANGIOGRAPHY N/A 03/08/2017  ? Procedure: Left Heart Cath and Coronary Angiography;  Surgeon: Nelva Bush, MD;  Location: [facility];  Service: Cardiovascular;  Laterality: N/A;  ? LEFT HEART CATHETERIZATION WITH CORONARY ANGIOGRAM N/A 01/07/2014  ? Procedure: LEFT HEART CATHETERIZATION WITH CORONARY ANGIOGRAM;  Surgeon: Larey Dresser, MD;  Location[facility];  Service: Cardiovascular;  Laterality: N/A;  ? ? ?Family History  ?Problem Relation Age of Onset  ? Heart  attack Mother 42  ? Diabetes Mother   ? Hypertension Mother   ? Heart attack Father 40  ? Heart attack Brother 32  ?     x 6  ? Heart disease Brother   ? Diabetes Brother   ? Colon cancer Paternal Aunt   ?     51s, died with brain anuerysm  ? Crohn's disease Cousin   ?     paternal  ? Diabetes Sister   ? GER disease Daughter   ? Cervical cancer Daughter   ? Diabetes Daughter   ? ? ?Social History  ? ?Socioeconomic History  ? Marital status: Widowed  ?  Spouse name: Not on file  ? Number of children: 4  ? Years of education: 57  ? Highest education level: 11th grade  ?Occupational History  ? Occupation: Disability  ?  Employer: DISABLED  ?Tobacco Use  ? Smoking status: Never  ? Smokeless tobacco: Never  ? Tobacco comments:  ?  spouse, 36 years - husband has quit 01/2011  ?Vaping Use  ? Vaping Use: Never used  ?Substance and Sexual Activity  ? Alcohol use: No  ? Drug use: No  ? Sexual activity: Not Currently  ?Other Topics Concern  ? Not on file  ?Social History Narrative  ? Lives alone, one level, handicap accessible bathroom, her children all live nearby  ? ?Social Determinants of Health  ? ?Financial Resource Strain: Low Risk   ? Difficulty of Paying Living Expenses: Not very hard  ?Food Insecurity: No Food Insecurity  ? Worried About Charity fundraiser in the Last Year: Never true  ? Ran Out of Food in the Last Year: Never true  ?Transportation Needs: No Transportation Needs  ? Lack of Transportation (Medical): No  ? Lack of Transportation (Non-Medical): No  ?Physical Activity: Inactive  ? Days of Exercise per Week: 0 days  ? Minutes of Exercise per Session: 0 min  ?Stress: No Stress Concern Present  ? Feeling of Stress : Only a little  ?Social Connections: Moderately Integrated  ? Frequency of Communication with Friends and Family: More than three times a week  ? Frequency of Social Gatherings with Friends and Family: More than three times a week  ? Attends Religious Services: More than 4 times per year  ?  Active Member of Clubs or Organizations: Yes  ? Attends Archivist Meetings: More than 4 times per year  ? Marital Status: Widowed  ?Intimate Partner Violence: Not At Risk  ? Fear of Current or Ex-Partner: No  ? Emotionally Abused: No  ? Physically Abused: No  ? Sexually Abused: No  ? ? ?Outpatient Medications Prior to Visit  ?Medication Sig Dispense Refill  ? amLODipine (NORVASC) 10 MG tablet Take 1 tablet (10  mg total) by mouth daily. 30 tablet 5  ? aspirin 325 MG tablet Take 325 mg by mouth daily.    ? atenolol (TENORMIN) 50 MG tablet Take 1 tablet (50 mg total) by mouth 2 (two) times daily. 180 tablet 3  ? calcium carbonate (OS-CAL) 600 MG TABS tablet Take 1 tablet (600 mg total) by mouth 2 (two) times daily with a meal. 180 tablet 3  ? citalopram (CELEXA) 40 MG tablet Take 40 mg by mouth daily.    ? Continuous Blood Gluc Receiver (FREESTYLE LIBRE 2 READER) DEVI USE TO TEST BLOOD SUGAR 6 TIMES DAILY. Dx:E11.65 1 each 0  ? Continuous Blood Gluc Sensor (FREESTYLE LIBRE 2 SENSOR) MISC USE TO TEST BLOOD SUGAR 6 TIMES DAILY. Dx:E11.65 2 each 11  ? esomeprazole (NEXIUM) 40 MG capsule Take 1 capsule (40 mg total) by mouth daily. 30 capsule 3  ? fluconazole (DIFLUCAN) 150 MG tablet Take 1 tablet (150 mg total) by mouth every three (3) days as needed. 2 tablet 0  ? furosemide (LASIX) 20 MG tablet TAKE ONE (1) TABLET EACH DAY 90 tablet 0  ? hydrOXYzine (ATARAX/VISTARIL) 10 MG tablet Take 1 tablet (10 mg total) by mouth 3 (three) times daily as needed. 30 tablet 0  ? insulin glargine, 1 Unit Dial, (TOUJEO SOLOSTAR) 300 UNIT/ML Solostar Pen Inject 58 Units into the skin daily. 9 mL 2  ? isosorbide mononitrate (IMDUR) 60 MG 24 hr tablet TAKE ONE (1) TABLET EACH DAY 30 tablet 5  ? meclizine (ANTIVERT) 25 MG tablet Take 1 tablet (25 mg total) by mouth 3 (three) times daily as needed for dizziness. 90 tablet 3  ? nitroGLYCERIN (NITROSTAT) 0.4 MG SL tablet DISSOLVE 1 TABLET UNDER TONGUE FOR CHESTPAIN.MAY REPEAT EVERY 5  MINUTES FOR 3 DOSES.IF NO RELIEF CALL 911 OR GO TO ER 25 tablet 6  ? NOVOLOG FLEXPEN 100 UNIT/ML FlexPen INJECT 35-40 UNITS SQ 3 TIMES DAILY WITH MEALS (Patient taking differently: 8-30 Units. Takes 8-30 units TID with

## 2021-12-28 ENCOUNTER — Other Ambulatory Visit: Payer: Self-pay | Admitting: Family Medicine

## 2021-12-28 DIAGNOSIS — E876 Hypokalemia: Secondary | ICD-10-CM

## 2021-12-28 LAB — CMP14+EGFR
ALT: 9 IU/L (ref 0–32)
AST: 12 IU/L (ref 0–40)
Albumin/Globulin Ratio: 1.4 (ref 1.2–2.2)
Albumin: 3.9 g/dL (ref 3.7–4.7)
Alkaline Phosphatase: 69 IU/L (ref 44–121)
BUN/Creatinine Ratio: 16 (ref 12–28)
BUN: 14 mg/dL (ref 8–27)
Bilirubin Total: 0.3 mg/dL (ref 0.0–1.2)
CO2: 28 mmol/L (ref 20–29)
Calcium: 9.3 mg/dL (ref 8.7–10.3)
Chloride: 97 mmol/L (ref 96–106)
Creatinine, Ser: 0.86 mg/dL (ref 0.57–1.00)
Globulin, Total: 2.7 g/dL (ref 1.5–4.5)
Glucose: 217 mg/dL — ABNORMAL HIGH (ref 70–99)
Potassium: 3.2 mmol/L — ABNORMAL LOW (ref 3.5–5.2)
Sodium: 143 mmol/L (ref 134–144)
Total Protein: 6.6 g/dL (ref 6.0–8.5)
eGFR: 71 mL/min/{1.73_m2} (ref >59)

## 2021-12-28 LAB — SPECIMEN STATUS REPORT

## 2021-12-28 LAB — ARTHRITIS PANEL
Basophils Absolute: 0.1 10*3/uL (ref 0.0–0.2)
Basos: 1 %
EOS (ABSOLUTE): 0.1 10*3/uL (ref 0.0–0.4)
Eos: 1 %
Hematocrit: 37.1 % (ref 34.0–46.6)
Hemoglobin: 12.1 g/dL (ref 11.1–15.9)
Immature Grans (Abs): 0 10*3/uL (ref 0.0–0.1)
Immature Granulocytes: 0 %
Lymphocytes Absolute: 3 10*3/uL (ref 0.7–3.1)
Lymphs: 34 %
MCH: 29.2 pg (ref 26.6–33.0)
MCHC: 32.6 g/dL (ref 31.5–35.7)
MCV: 90 fL (ref 79–97)
Monocytes Absolute: 0.6 10*3/uL (ref 0.1–0.9)
Monocytes: 7 %
Neutrophils Absolute: 5 10*3/uL (ref 1.4–7.0)
Neutrophils: 57 %
Platelets: 190 10*3/uL (ref 150–450)
RBC: 4.14 x10E6/uL (ref 3.77–5.28)
RDW: 13.4 % (ref 11.7–15.4)
Rheumatoid fact SerPl-aCnc: <10 IU/mL (ref <14.0)
Sed Rate: 7 mm/hr (ref 0–40)
Uric Acid: 4.5 mg/dL (ref 3.1–7.9)
WBC: 8.7 10*3/uL (ref 3.4–10.8)

## 2021-12-28 LAB — MAGNESIUM: Magnesium: 1.6 mg/dL (ref 1.6–2.3)

## 2021-12-28 LAB — C-REACTIVE PROTEIN: CRP: 54 mg/L — ABNORMAL HIGH (ref 0–10)

## 2021-12-28 LAB — ANA: Anti Nuclear Antibody (ANA): NEGATIVE

## 2021-12-28 MED ORDER — POTASSIUM CHLORIDE CRYS ER 10 MEQ PO TBCR: 10.0000 meq | EXTENDED_RELEASE_TABLET | Freq: Every day | ORAL | 0 refills | Status: DC

## 2021-12-29 LAB — URINE CULTURE: Organism ID, Bacteria: NO GROWTH

## 2022-01-01 LAB — ROCKY MTN SPOTTED FVR AB, IGG-BLOOD: RMSF IgG: POSITIVE — AB

## 2022-01-01 LAB — RMSF, IGG, IFA: RMSF, IGG, IFA: 1:128 {titer} — ABNORMAL HIGH

## 2022-01-01 LAB — ROCKY MTN SPOTTED FVR AB, IGM-BLOOD: RMSF IgM: 0.55 index (ref 0.00–0.89)

## 2022-01-01 LAB — LYME DISEASE SEROLOGY W/REFLEX: Lyme Total Antibody EIA: NEGATIVE

## 2022-01-01 LAB — SPECIMEN STATUS REPORT

## 2022-01-01 LAB — CREATININE KINASE MB: CK-MB Index: <1 ng/mL (ref 0.0–5.3)

## 2022-01-02 ENCOUNTER — Ambulatory Visit (INDEPENDENT_AMBULATORY_CARE_PROVIDER_SITE_OTHER): Payer: PPO | Admitting: Pharmacist

## 2022-01-02 DIAGNOSIS — E1165 Type 2 diabetes mellitus with hyperglycemia: Secondary | ICD-10-CM

## 2022-01-02 DIAGNOSIS — E1169 Type 2 diabetes mellitus with other specified complication: Secondary | ICD-10-CM

## 2022-01-02 NOTE — Progress Notes (Signed)
? ? ?Chronic Care Management ?Pharmacy Note ? ?01/02/2022 ?Name:  [name] MRN:  [id number] DOB:  [birth year] ? ?Summary: ?Diabetes: Goal on track: NO. ?Uncontrolled-per libre report-post prandials have increased, AVG BG is 220; currently on steroids for 2 weeks;  GFR 58 ?Past intolerances: metformin, farxiga (SGLT2s) ?current treatment: tresiba 54 units, novolog 20 units 2x w/ meals, ozempic 14m;  ?Increase tresiba to 60 units, novolog 30 units with meals (2x daily, only eats 2 meals), continue ozempic (could consider increase to 261mas needed) ?re enrolled for patient assistance with novo nordisk 2023 (tresiba, novolog, ozempic) ?Denies hypoglycemia (previously had) ?Discussed meal planning options and Plate method for healthy eating ?Avoid sugary drinks and desserts ?Incorporate balanced protein, non starchy veggies, 1 serving of carbohydrate with each meal ?Increase water intake ?Increase physical activity as able ?Discussed FOLLOWING A HEART HEALTHY DIET/HEALTHY PLATE METHOD ?Lipids currently controlled on rosuvastatin 4057mCurrent exercise: n/a ?Recommended INCREASE tresiba to 60 units, Novolog meal time ~30 units depending on carb (low,med,high)--discussed with patient; she has omitted some doses of novolog; encouraged compliance with meal time insulin to keep controlled ?Will re enroll for patient assistance with novo nordisk 2023 (tresiba, novolog, ozempic) ? ? ?Patient Goals/Self-Care Activities ?patient will:  ?- take medications as prescribed as evidenced by patient report and record review ?check glucose using libre 2 CGM, document, and provide at future appointments ?collaborate with provider on medication access solutions ? ?Subjective: ?LinEULALIA [name] an 71 10o. year old female who is a primary patient of Karen PerkingNP.  The CCM team was consulted for assistance with disease management and care coordination needs.   ? ?Engaged with patient face to face for follow up visit in response to  provider referral for pharmacy case management and/or care coordination services.  ? ?Consent to Services:  ?The patient was given information about Chronic Care Management services, agreed to services, and gave verbal consent prior to initiation of services.  Please see initial visit note for detailed documentation.  ? ?Patient Care Team: ?Karen PerkingNP as PCP - General (Family Medicine) ?Rourk, RobCristopher EstimableD (Gastroenterology) ?HocMinus BreedingD (Cardiology) ?HudIlean ChinaN as RegEquities tradere,Harlen LabsD as Referring Physician (Optometry) ?PruLavera GuisePHTrinity Hospital [facility] Pharmacist (Family Medicine) ? ?Objective: ? ?Lab Results  ?Component Value Date  ? CREATININE 0.86 12/27/2021  ? CREATININE 0.94 12/24/2021  ? CREATININE 0.94 12/11/2021  ? ? ?Lab Results  ?Component Value Date  ? HGBA1C 8.0 (H) 11/02/2021  ? ?Last diabetic Eye exam:  ?Lab Results  ?Component Value Date/Time  ? HMDIABEYEEXA No Retinopathy 11/29/2015 12:00 AM  ?  ?Last diabetic Foot exam: No results found for: HMDIABFOOTEX  ? ?   ?Component Value Date/Time  ? CHOL 130 08/09/2021 0959  ? CHOL 107 02/05/2013 1002  ? TRIG 93 08/09/2021 0959  ? TRIG 149 11/30/2015 0947  ? TRIG 143 02/05/2013 1002  ? HDL 39 (L) 08/09/2021 0959  ? HDL 35 (L) 11/30/2015 0947  ? HDL 35 (L) 02/05/2013 1002  ? CHOLHDL 3.3 08/09/2021 0959  ? CHOLHDL 3.9 09/22/2017 0518  ? VLDL 26 09/22/2017 0518  ? LDLBranson 08/09/2021 0959  ? LDLCALC 195 (H) 04/26/2014 1029  ? LDLCALC 43 02/05/2013 1002  ? LDLDIRECT 93 04/06/2015 1038  ? ? ? ?  Latest Ref Rng & Units 12/27/2021  ? 12:25 PM 12/24/2021  ?  6:10 AM 12/11/2021  ?  8:54 AM  ?Hepatic Function  ?  Total Protein 6.0 - 8.5 g/dL 6.6   7.2   6.7    ?Albumin 3.7 - 4.7 g/dL 3.9   3.4   4.2    ?AST 0 - 40 IU/L _0 ?ALT 0 - 32 IU/L _1 ?Alk Phosphatase 44 - 121 IU/L 69   52   54    ?Total Bilirubin 0.0 - 1.2 mg/dL 0.3   0.7   0.4    ? ? ?Lab Results  ?Component Value Date/Time  ? TSH 2.110 12/11/2021  08:54 AM  ? TSH 3.372 11/15/2020 08:14 PM  ? TSH 1.950 04/23/2018 09:43 AM  ? FREET4 1.16 04/17/2007 06:30 AM  ? ? ? ?  Latest Ref Rng & Units 12/27/2021  ? 12:25 PM 12/24/2021  ?  6:10 AM 12/11/2021  ?  8:54 AM  ?CBC  ?WBC 3.4 - 10.8 x10E3/uL 8.7   8.2   7.3    ?Hemoglobin 11.1 - 15.9 g/dL 12.1   11.8   12.1    ?Hematocrit 34.0 - 46.6 % 37.1   35.3   35.5    ?Platelets 150 - 450 x10E3/uL 190   167   178    ? ? ?Lab Results  ?Component Value Date/Time  ? VD25OH 90.28 11/15/2020 08:14 PM  ? VD25OH 45.4 02/20/2019 11:52 AM  ? ? ?Clinical ASCVD: No  ?The 10-year ASCVD risk score (Arnett DK, et al., 2019) is: 41.9% ?  Values used to calculate the score: ?    Age: 21 years ?    Sex: Female ?    Is Non-Hispanic African American: No ?    Diabetic: Yes ?    Tobacco smoker: No ?    Systolic Blood Pressure: 299 mmHg ?    Is BP treated: Yes ?    HDL Cholesterol: 39 mg/dL ?    Total Cholesterol: 130 mg/dL   ? ?Other: (CHADS2VASc if Afib, PHQ9 if depression, MMRC or CAT for COPD, ACT, DEXA) ? ?Social History  ? ?Tobacco Use  ?Smoking Status Never  ?Smokeless Tobacco Never  ?Tobacco Comments  ? spouse, 69 years - husband has quit 01/2011  ? ?BP Readings from Last 3 Encounters:  ?12/27/21 (!) 160/90  ?12/24/21 (!) 174/75  ?12/11/21 134/64  ? ?Pulse Readings from Last 3 Encounters:  ?12/27/21 99  ?12/24/21 95  ?12/11/21 87  ? ?Wt Readings from Last 3 Encounters:  ?12/27/21 153 lb (69.4 kg)  ?12/24/21 154 lb (69.9 kg)  ?12/11/21 155 lb 8 oz (70.5 kg)  ? ? ?Assessment: Review of patient past medical history, allergies, medications, health status, including review of consultants reports, laboratory and other test data, was performed as part of comprehensive evaluation and provision of chronic care management services.  ? ?SDOH:  (Social Determinants of Health) assessments and interventions performed:  ? ? ?CCM Care Plan ? ?Allergies  ?Allergen Reactions  ? Iohexol   ?   Desc: pt had syncopal episode with nausea post IV CM late 1990's,  pt  has had prednisone prep with heart caths x 2 without problem  kdean 04/16/07, Onset Date: [id number] ?  ? Ticlid [Ticlopidine Hcl] Nausea And Vomiting  ? Jardiance [Empagliflozin] Other (See Comments)  ?  Recurrent UTIs  ? Metformin And Related Diarrhea  ? Codeine Nausea And Vomiting and Palpitations  ? ? ?Medications Reviewed Today   ? ? Reviewed by Lavera Guise, [facility] (Pharmacist)  on 01/02/22 at 1354  Med List Status: <None>  ? ?Medication Order Taking? Sig Documenting Provider Last Dose Status Informant  ?amLODipine (NORVASC) 10 MG tablet [id number]  Take 1 tablet (10 mg total) by mouth daily. Roxan Hockey, MD  Active Self  ?aspirin 325 MG tablet [id number]  Take 325 mg by mouth daily. [provider]  Active Self  ?atenolol (TENORMIN) 50 MG tablet [id number]  Take 1 tablet (50 mg total) by mouth 2 (two) times daily. Gwenlyn Perking, FNP  Active   ?calcium carbonate (OS-CAL) 600 MG TABS tablet [id number]  Take 1 tablet (600 mg total) by mouth 2 (two) times daily with a meal. Gwenlyn Perking, FNP  Active Self  ?citalopram (CELEXA) 40 MG tablet [id number]  Take 40 mg by mouth daily. [provider]  Active   ?Continuous Blood Gluc Receiver (FREESTYLE LIBRE 2 READER) DEVI [id number]  USE TO TEST BLOOD SUGAR 6 TIMES DAILY. Dx:E11.65 Gwenlyn Perking, FNP  Active   ?Continuous Blood Gluc Sensor (FREESTYLE LIBRE 2 SENSOR) MISC [id number]  USE TO TEST BLOOD SUGAR 6 TIMES DAILY. Dx:E11.65 Gwenlyn Perking, FNP  Active   ?esomeprazole (NEXIUM) 40 MG capsule [id number]  Take 1 capsule (40 mg total) by mouth daily. Hendricks Limes F, FNP  Active Self  ?fluconazole (DIFLUCAN) 150 MG tablet [id number]  Take 1 tablet (150 mg total) by mouth every three (3) days as needed. Evelina Dun A, FNP  Active   ?furosemide (LASIX) 20 MG tablet [id number]  TAKE ONE (1) TABLET EACH DAY Rakes, Connye Burkitt, FNP  Active   ?hydrOXYzine (ATARAX/VISTARIL) 10 MG tablet [id number]  Take 1 tablet (10 mg total) by mouth 3 (three) times  daily as needed. Gwenlyn Perking, FNP  Active   ?  Discontinued 01/02/22 1352 (Change in therapy)   ?isosorbide mononitrate (IMDUR) 60 MG 24 hr tablet [id number]  TAKE ONE (1) TABLET EACH DAY [place], [place]

## 2022-01-02 NOTE — Patient Instructions (Signed)
Visit Information ? ?Following are the goals we discussed today:  ?Current Barriers:  ?Unable to independently afford treatment regimen ?Unable to achieve control of T2DM  ?Unable to maintain control of T2DM ? ?Pharmacist Clinical Goal(s):  ?patient will verbalize ability to afford treatment regimen ?achieve control of t2dm as evidenced by goal a1c<7% through collaboration with PharmD and provider.  ? ?Interventions: ?1:1 collaboration with Gwenlyn Perking, FNP regarding development and update of comprehensive plan of care as evidenced by provider attestation and co-signature ?Inter-disciplinary care team collaboration (see longitudinal plan of care) ?Comprehensive medication review performed; medication list updated in electronic medical record ? ?Diabetes: Goal on track: NO. ?Uncontrolled-per libre report-post prandials have increased, AVG BG is 220; currently on steroids for 2 weeks;  GFR 58 ?current treatment: tresiba 54 units, novolog 20 units 2x w/ meals, ozempic '1mg'$ ;  ?Increase tresiba to 60 units, novolog 30 units with meals (2x daily, only eats 2 meals), continue ozempic (could consider increase to '2mg'$  as needed) ?re enrolled for patient assistance with novo nordisk 2023 (tresiba, novolog, ozempic) ?Denies hypoglycemia (previously had) ?Discussed meal planning options and Plate method for healthy eating ?Avoid sugary drinks and desserts ?Incorporate balanced protein, non starchy veggies, 1 serving of carbohydrate with each meal ?Increase water intake ?Increase physical activity as able ?Discussed FOLLOWING A HEART HEALTHY DIET/HEALTHY PLATE METHOD ?Lipids currently controlled on rosuvastatin '40mg'$  ?Current exercise: n/a ?Recommended INCREASE tresiba to 60 units, Novolog meal time ~30 units depending on carb (low,med,high)--discussed with patient; she has omitted some doses of novolog; encouraged compliance with meal time insulin to keep controlled ?Will re enroll for patient assistance with novo nordisk  2023 (tresiba, novolog, ozempic) ? ? ?Patient Goals/Self-Care Activities ?patient will:  ?- take medications as prescribed as evidenced by patient report and record review ?check glucose using libre 2 CGM, document, and provide at future appointments ?collaborate with provider on medication access solutions ? ? ?Plan: Telephone follow up appointment with care management team member scheduled for:  2 months ? ?Signature ?[name], PharmD, BCPS ?Clinical Pharmacist, [facility] ?[place]  [place] Phone [phone] ? ? ?Please call the care guide team at [phone] if you need to cancel or reschedule your appointment.  ? ?The patient verbalized understanding of instructions, educational materials, and care plan provided today and declined offer to receive copy of patient instructions, educational materials, and care plan. ' ? ?

## 2022-01-04 DIAGNOSIS — Z09 Encounter for follow-up examination after completed treatment for conditions other than malignant neoplasm: Secondary | ICD-10-CM | POA: Diagnosis not present

## 2022-01-04 DIAGNOSIS — Z8669 Personal history of other diseases of the nervous system and sense organs: Secondary | ICD-10-CM | POA: Diagnosis not present

## 2022-01-08 ENCOUNTER — Encounter: Payer: Self-pay | Admitting: Family Medicine

## 2022-01-08 ENCOUNTER — Ambulatory Visit (INDEPENDENT_AMBULATORY_CARE_PROVIDER_SITE_OTHER): Payer: PPO | Admitting: Family Medicine

## 2022-01-08 VITALS — BP 155/86 | HR 87 | Temp 99.0°F | Ht 66.0 in | Wt 156.5 lb

## 2022-01-08 DIAGNOSIS — M2550 Pain in unspecified joint: Secondary | ICD-10-CM

## 2022-01-08 DIAGNOSIS — I152 Hypertension secondary to endocrine disorders: Secondary | ICD-10-CM

## 2022-01-08 DIAGNOSIS — E1159 Type 2 diabetes mellitus with other circulatory complications: Secondary | ICD-10-CM

## 2022-01-08 MED ORDER — PREDNISONE 5 MG PO TABS: 5.0000 mg | ORAL_TABLET | Freq: Every day | ORAL | 0 refills | Status: AC

## 2022-01-08 NOTE — Patient Instructions (Signed)
Joint Pain Joint pain may be caused by many things. Joint pain is likely to go away when you follow instructions from your health care provider for relieving pain at home. However, joint pain can also be caused by conditions that require more treatment. Common causes of joint pain include: Bruising in the area of the joint. Injury caused by repeating certain movements too many times. Age-related joint wear and tear. Buildup of uric acid crystals in the joint (gout). Inflammation of the joint. Other forms of arthritis. Infections of the joint or of the bone. Your health care provider may recommend that you take pain medicine or wear a supportive device like an elastic bandage, sling, or splint. If your joint pain continues, you may need lab or imaging tests to diagnose the cause of your joint pain. Follow these instructions at home: Managing pain, stiffness, and swelling     If directed, put ice on the painful area. To do this: If you have a removable elastic bandage, sling, or splint, take it off as told by your doctor. Put ice in a plastic bag. Place a towel between your skin and the bag. Leave the ice on for 20 minutes, 2-3 times a day. Remove the ice if your skin turns bright red. This is very important. If you cannot feel pain, heat, or cold, you have a greater risk of damage to the area. Move your fingers and toes often to reduce stiffness and swelling. Raise the injured area above the level of your heart while you are sitting or lying down. If directed, apply heat to the painful area as often as told by your health care provider. Use the heat source that your health care provider recommends, such as a moist heat pack or a heating pad. Place a towel between your skin and the heat source. Leave the heat on for 20-30 minutes. Remove the heat if your skin turns bright red. This is especially important if you are unable to feel pain, heat, or cold. You have a greater risk of getting  burned.  Activity Rest as told by your health care provider. Do not do anything that causes or worsens pain. Begin exercising or stretching the affected area as told by your health care provider. Return to your normal activities as told by your health care provider. Ask your health care provider what activities are safe for you. If you have an elastic bandage, sling, or splint: Wear the bandage, sling, or splint as told by your health care provider. Remove it only as told by your health care provider. Loosen it if your fingers or toes below the joint tingle, become numb, or turn cold and blue. Keep it clean. Ask your health care provider if you should remove it before bathing. If the bandage, sling, or splint is not waterproof: Do not let it get wet. Cover it with a watertight covering when you take a bath or shower. General instructions Treatment may include medicines for pain and inflammation that are taken by mouth or applied to the skin. Take over-the-counter and prescription medicines only as told by your health care provider. Do not use any products that contain nicotine or tobacco, such as cigarettes, e-cigarettes, and chewing tobacco. If you need help quitting, ask your health care provider. Keep all follow-up visits. This is important. Contact a health care provider if: You have pain that gets worse and does not get better with medicine. Your joint pain does not improve within 3 days. You have increased   bruising or swelling. You have a fever. You lose 10 lb (4.5 kg) or more without trying. Get help right away if: You cannot move the joint. Your fingers or toes tingle, become numb, or turn cold and blue. You have a fever along with a joint that is red, warm, and swollen. Summary Joint pain may be caused by many things. Your health care provider may recommend that you take pain medicine or wear a supportive device such as an elastic bandage, sling, or splint. If your joint pain  continues, you may need tests to diagnose the cause of your joint pain. Take over-the-counter and prescription medicines only as told by your health care provider. This information is not intended to replace advice given to you by your health care provider. Make sure you discuss any questions you have with your health care provider. Document Revised: 12/23/2019 Document Reviewed: 12/23/2019 Elsevier Patient Education  2023 [employer]  

## 2022-01-08 NOTE — Progress Notes (Signed)
? ?Acute Office Visit ? ?Subjective:  ? ? Patient ID: [name], female    DOB: September 30, 1950, 71 y.o.   MRN: [id number] ? ?Chief Complaint  ?Patient presents with  ? Joint Pain  ? ? ?HPI ?Patient is in today for follow up of joint pain. She has been taking prednisone 10 mg daily. She reports improve in pain, swelling, and mobility. She has pain in her joints in the morning and it takes her much longer to complete her ADLs than usual but does feel much better and the pain is manageable now. No longer using cane for ambulation.  ? ?She has had difficulty sleeping. She has been taking trazadone intermittently without much improvement. She has felt tired because she isn't sleeping well.  ? ?She denies chest pain, dizziness, shortness of breath, edema, focal weakness, fever, or rash. She has been checking her BP at home and reports that it has been stable for her with readings of 150s/80s typically.  ? ?Past Medical History:  ?Diagnosis Date  ? Anxiety   ? CAD (coronary artery disease)   ? DES to circumflex 02/2007, BMS to LAD and PTCA diagonal 03/2007  ? Carotid artery plaque   ? Mild  ? Cataract   ? Depression   ? Diverticulitis, colon   ? Elevated d-dimer 01/08/2014  ? Essential hypertension, benign   ? GERD (gastroesophageal reflux disease)   ? H/O hiatal hernia   ? HLD (hyperlipidemia)   ? IDDM (insulin dependent diabetes mellitus)   ? Migraine   ? "used to have them really bad; don't have them anymore" (01/07/2014)  ? MS (multiple sclerosis) ([place])   ? Not confirmed  ? PAT (paroxysmal atrial tachycardia) ([place])   ? Prolapse of uterus   ? PVD (peripheral vascular disease) ([place])   ? TIA (transient ischemic attack) 1980's  ? ? ?Past Surgical History:  ?Procedure Laterality Date  ? ABDOMINAL HYSTERECTOMY  1986  ? ovaries remain - prolaspe uterus   ? APPENDECTOMY  ~ 1970  ? BREAST BIOPSY Right 1980's  ? BREAST LUMPECTOMY Right 1980's  ? Dr. Charlynne Pander   ? CARDIAC CATHETERIZATION  01/07/2014  ? CHOLECYSTECTOMY  ?1987  ?  COLONOSCOPY  2002  ? Dr. Rowe Pavy Severe diverticular changes in the region of the sigmoid and descending colon with scattered diverticular changes throughout the rest of the colon. No polyps, ulcerations. Despite numerous manipulations, the tip of the scope could not be tipped into the cecal area.  ? COLONOSCOPY  01/10/2012  ? Procedure: COLONOSCOPY;  Surgeon: Daneil Dolin, MD;  Location: [facility];  Service: Endoscopy;  Laterality: N/A;  1:55  ? CORONARY ANGIOPLASTY WITH STENT PLACEMENT  ~ 1997 X 2  ? "2 + 1"  ? EYE SURGERY Bilateral 2014  ? cataract  ? INTRAVASCULAR PRESSURE WIRE/FFR STUDY N/A 03/08/2017  ? Procedure: Intravascular Pressure Wire/FFR Study;  Surgeon: Nelva Bush, MD;  Location: [facility];  Service: Cardiovascular;  Laterality: N/A;  ? LEFT HEART CATH AND CORONARY ANGIOGRAPHY N/A 03/08/2017  ? Procedure: Left Heart Cath and Coronary Angiography;  Surgeon: Nelva Bush, MD;  Location: [facility];  Service: Cardiovascular;  Laterality: N/A;  ? LEFT HEART CATHETERIZATION WITH CORONARY ANGIOGRAM N/A 01/07/2014  ? Procedure: LEFT HEART CATHETERIZATION WITH CORONARY ANGIOGRAM;  Surgeon: Larey Dresser, MD;  Location: [facility] C[facility] LAB;  Service: Cardiovascular;  Laterality: N/A;  ? ? ?Family History  ?Problem Relation Age of Onset  ? Heart attack Mother  9  ? Diabetes Mother   ? Hypertension Mother   ? Heart attack Father 29  ? Heart attack Brother 32  ?     x 6  ? Heart disease Brother   ? Diabetes Brother   ? Colon cancer Paternal Aunt   ?     54s, died with brain anuerysm  ? Crohn's disease Cousin   ?     paternal  ? Diabetes Sister   ? GER disease Daughter   ? Cervical cancer Daughter   ? Diabetes Daughter   ? ? ?Social History  ? ?Socioeconomic History  ? Marital status: Widowed  ?  Spouse name: Not on file  ? Number of children: 4  ? Years of education: 18  ? Highest education level: 11th grade  ?Occupational History  ? Occupation: Disability  ?  Employer: DISABLED  ?Tobacco  Use  ? Smoking status: Never  ? Smokeless tobacco: Never  ? Tobacco comments:  ?  spouse, 26 years - husband has quit 01/2011  ?Vaping Use  ? Vaping Use: Never used  ?Substance and Sexual Activity  ? Alcohol use: No  ? Drug use: No  ? Sexual activity: Not Currently  ?Other Topics Concern  ? Not on file  ?Social History Narrative  ? Lives alone, one level, handicap accessible bathroom, her children all live nearby  ? ?Social Determinants of Health  ? ?Financial Resource Strain: Low Risk   ? Difficulty of Paying Living Expenses: Not very hard  ?Food Insecurity: No Food Insecurity  ? Worried About Charity fundraiser in the Last Year: Never true  ? Ran Out of Food in the Last Year: Never true  ?Transportation Needs: No Transportation Needs  ? Lack of Transportation (Medical): No  ? Lack of Transportation (Non-Medical): No  ?Physical Activity: Inactive  ? Days of Exercise per Week: 0 days  ? Minutes of Exercise per Session: 0 min  ?Stress: No Stress Concern Present  ? Feeling of Stress : Only a little  ?Social Connections: Moderately Integrated  ? Frequency of Communication with Friends and Family: More than three times a week  ? Frequency of Social Gatherings with Friends and Family: More than three times a week  ? Attends Religious Services: More than 4 times per year  ? Active Member of Clubs or Organizations: Yes  ? Attends Archivist Meetings: More than 4 times per year  ? Marital Status: Widowed  ?Intimate Partner Violence: Not At Risk  ? Fear of Current or Ex-Partner: No  ? Emotionally Abused: No  ? Physically Abused: No  ? Sexually Abused: No  ? ? ?Outpatient Medications Prior to Visit  ?Medication Sig Dispense Refill  ? amLODipine (NORVASC) 10 MG tablet Take 1 tablet (10 mg total) by mouth daily. 30 tablet 5  ? aspirin 325 MG tablet Take 325 mg by mouth daily.    ? atenolol (TENORMIN) 50 MG tablet Take 1 tablet (50 mg total) by mouth 2 (two) times daily. 180 tablet 3  ? calcium carbonate (OS-CAL) 600  MG TABS tablet Take 1 tablet (600 mg total) by mouth 2 (two) times daily with a meal. 180 tablet 3  ? citalopram (CELEXA) 40 MG tablet Take 40 mg by mouth daily.    ? Continuous Blood Gluc Receiver (FREESTYLE LIBRE 2 READER) DEVI USE TO TEST BLOOD SUGAR 6 TIMES DAILY. Dx:E11.65 1 each 0  ? Continuous Blood Gluc Sensor (FREESTYLE LIBRE 2 SENSOR) MISC USE TO TEST BLOOD  SUGAR 6 TIMES DAILY. Dx:E11.65 2 each 11  ? esomeprazole (NEXIUM) 40 MG capsule Take 1 capsule (40 mg total) by mouth daily. 30 capsule 3  ? fluconazole (DIFLUCAN) 150 MG tablet Take 1 tablet (150 mg total) by mouth every three (3) days as needed. 2 tablet 0  ? furosemide (LASIX) 20 MG tablet TAKE ONE (1) TABLET EACH DAY 90 tablet 0  ? hydrOXYzine (ATARAX/VISTARIL) 10 MG tablet Take 1 tablet (10 mg total) by mouth 3 (three) times daily as needed. 30 tablet 0  ? insulin degludec (TRESIBA FLEXTOUCH) 100 UNIT/ML FlexTouch Pen Inject 60 Units into the skin daily.    ? isosorbide mononitrate (IMDUR) 60 MG 24 hr tablet TAKE ONE (1) TABLET EACH DAY 30 tablet 5  ? meclizine (ANTIVERT) 25 MG tablet Take 1 tablet (25 mg total) by mouth 3 (three) times daily as needed for dizziness. 90 tablet 3  ? meloxicam (MOBIC) 15 MG tablet Take 1 tablet (15 mg total) by mouth daily. 30 tablet 0  ? nitroGLYCERIN (NITROSTAT) 0.4 MG SL tablet DISSOLVE 1 TABLET UNDER TONGUE FOR CHESTPAIN.MAY REPEAT EVERY 5 MINUTES FOR 3 DOSES.IF NO RELIEF CALL 911 OR GO TO ER 25 tablet 6  ? NOVOLOG FLEXPEN 100 UNIT/ML FlexPen INJECT 35-40 UNITS SQ 3 TIMES DAILY WITH MEALS (Patient taking differently: 20-30 Units 3 (three) times daily with meals. Takes 8-30 units TID with meals depending on glucose) 30 mL 3  ? ondansetron (ZOFRAN) 8 MG tablet Take 0.5 tablets (4 mg total) by mouth every 6 (six) hours as needed. 60 tablet 0  ? potassium chloride (KLOR-CON M) 10 MEQ tablet Take 1 tablet (10 mEq total) by mouth daily. 30 tablet 0  ? predniSONE (DELTASONE) 10 MG tablet Take 1 tablet (10 mg total) by  mouth daily with breakfast for 14 days. 14 tablet 0  ? rOPINIRole (REQUIP) 0.5 MG tablet Take 2 tablets (1 mg total) by mouth at bedtime. 180 tablet 2  ? rosuvastatin (CRESTOR) 40 MG tablet TAKE ONE (1) TABLET

## 2022-01-15 ENCOUNTER — Ambulatory Visit (INDEPENDENT_AMBULATORY_CARE_PROVIDER_SITE_OTHER): Payer: PPO | Admitting: Family Medicine

## 2022-01-15 ENCOUNTER — Encounter: Payer: Self-pay | Admitting: Family Medicine

## 2022-01-15 VITALS — BP 160/92 | HR 80 | Temp 97.9°F | Ht 66.0 in | Wt 157.4 lb

## 2022-01-15 DIAGNOSIS — M2550 Pain in unspecified joint: Secondary | ICD-10-CM | POA: Diagnosis not present

## 2022-01-15 DIAGNOSIS — I10 Essential (primary) hypertension: Secondary | ICD-10-CM | POA: Diagnosis not present

## 2022-01-15 DIAGNOSIS — A770 Spotted fever due to Rickettsia rickettsii: Secondary | ICD-10-CM

## 2022-01-15 DIAGNOSIS — G4701 Insomnia due to medical condition: Secondary | ICD-10-CM | POA: Diagnosis not present

## 2022-01-15 DIAGNOSIS — L299 Pruritus, unspecified: Secondary | ICD-10-CM

## 2022-01-15 MED ORDER — TRAZODONE HCL 100 MG PO TABS: 100.0000 mg | ORAL_TABLET | Freq: Every evening | ORAL | 1 refills | Status: DC | PRN

## 2022-01-15 MED ORDER — TRIAMCINOLONE ACETONIDE 0.5 % EX OINT: 1.0000 "application " | TOPICAL_OINTMENT | Freq: Two times a day (BID) | CUTANEOUS | 0 refills | Status: DC

## 2022-01-15 NOTE — Patient Instructions (Signed)
Insomnia Insomnia is a sleep disorder that makes it difficult to fall asleep or stay asleep. Insomnia can cause fatigue, low energy, difficulty concentrating, mood swings, and poor performance at work or school. There are three different ways to classify insomnia: Difficulty falling asleep. Difficulty staying asleep. Waking up too early in the morning. Any type of insomnia can be long-term (chronic) or short-term (acute). Both are common. Short-term insomnia usually lasts for 3 months or less. Chronic insomnia occurs at least three times a week for longer than 3 months. What are the causes? Insomnia may be caused by another condition, situation, or substance, such as: Having certain mental health conditions, such as anxiety and depression. Using caffeine, alcohol, tobacco, or drugs. Having gastrointestinal conditions, such as gastroesophageal reflux disease (GERD). Having certain medical conditions. These include: Asthma. Alzheimer's disease. Stroke. Chronic pain. An overactive thyroid gland (hyperthyroidism). Other sleep disorders, such as restless legs syndrome and sleep apnea. Menopause. Sometimes, the cause of insomnia may not be known. What increases the risk? Risk factors for insomnia include: Gender. Females are affected more often than males. Age. Insomnia is more common as people get older. Stress and certain medical and mental health conditions. Lack of exercise. Having an irregular work schedule. This may include working night shifts and traveling between different time zones. What are the signs or symptoms? If you have insomnia, the main symptom is having trouble falling asleep or having trouble staying asleep. This may lead to other symptoms, such as: Feeling tired or having low energy. Feeling nervous about going to sleep. Not feeling rested in the morning. Having trouble concentrating. Feeling irritable, anxious, or depressed. How is this diagnosed? This condition  may be diagnosed based on: Your symptoms and medical history. Your health care provider may ask about: Your sleep habits. Any medical conditions you have. Your mental health. A physical exam. How is this treated? Treatment for insomnia depends on the cause. Treatment may focus on treating an underlying condition that is causing the insomnia. Treatment may also include: Medicines to help you sleep. Counseling or therapy. Lifestyle adjustments to help you sleep better. Follow these instructions at home: Eating and drinking  Limit or avoid alcohol, caffeinated beverages, and products that contain nicotine and tobacco, especially close to bedtime. These can disrupt your sleep. Do not eat a large meal or eat spicy foods right before bedtime. This can lead to digestive discomfort that can make it hard for you to sleep. Sleep habits  Keep a sleep diary to help you and your health care provider figure out what could be causing your insomnia. Write down: When you sleep. When you wake up during the night. How well you sleep and how rested you feel the next day. Any side effects of medicines you are taking. What you eat and drink. Make your bedroom a dark, comfortable place where it is easy to fall asleep. Put up shades or blackout curtains to block light from outside. Use a white noise machine to block noise. Keep the temperature cool. Limit screen use before bedtime. This includes: Not watching TV. Not using your smartphone, tablet, or computer. Stick to a routine that includes going to bed and waking up at the same times every day and night. This can help you fall asleep faster. Consider making a quiet activity, such as reading, part of your nighttime routine. Try to avoid taking naps during the day so that you sleep better at night. Get out of bed if you are still awake after   15 minutes of trying to sleep. Keep the lights down, but try reading or doing a quiet activity. When you feel  sleepy, go back to bed. General instructions Take over-the-counter and prescription medicines only as told by your health care provider. Exercise regularly as told by your health care provider. However, avoid exercising in the hours right before bedtime. Use relaxation techniques to manage stress. Ask your health care provider to suggest some techniques that may work well for you. These may include: Breathing exercises. Routines to release muscle tension. Visualizing peaceful scenes. Make sure that you drive carefully. Do not drive if you feel very sleepy. Keep all follow-up visits. This is important. Contact a health care provider if: You are tired throughout the day. You have trouble in your daily routine due to sleepiness. You continue to have sleep problems, or your sleep problems get worse. Get help right away if: You have thoughts about hurting yourself or someone else. Get help right away if you feel like you may hurt yourself or others, or have thoughts about taking your own life. Go to your nearest emergency room or: Call 911. Call the National Suicide Prevention Lifeline at 1-800-273-8255 or 988. This is open 24 hours a day. Text the Crisis Text Line at 741741. Summary Insomnia is a sleep disorder that makes it difficult to fall asleep or stay asleep. Insomnia can be long-term (chronic) or short-term (acute). Treatment for insomnia depends on the cause. Treatment may focus on treating an underlying condition that is causing the insomnia. Keep a sleep diary to help you and your health care provider figure out what could be causing your insomnia. This information is not intended to replace advice given to you by your health care provider. Make sure you discuss any questions you have with your health care provider. Document Revised: 08/21/2021 Document Reviewed: 08/21/2021 Elsevier Patient Education  2023 [employer]vier Inc.  

## 2022-01-15 NOTE — Progress Notes (Signed)
? ?Established Patient Office Visit ? ?Subjective   ?Patient ID: [name], female    DOB: [birth year]  Age: 71 y.o. MRN: [id number] ? ?Chief Complaint  ?Patient presents with  ? Hypertension  ? ? ?HPI ?Lashe is here for follow up of joint pain and HTN. She reports that her joint pain has resolved for the most part now. She denies erythema, warmth, or swelling to her joints. She has been on prednisone 5 mg for the last week.  ? ?She did not take her BP medications this morning. She states that she doesn't typically take her BP medicine before she comes to her appointments. She reports that her BP has been 160/92 this morning when she checked it. She denies chest pain, shortness of breath, edema, dizziness, visual disturbances, or focal weakness.  ? ?She continues to have itching. Mostly along her arms. Denies rash. She has been taking benadryl without improvement.  ? ?She has been taking trazodone 50 mg without improvement in insomnia. She does feel like the decrease in prednisone dosage has provided any relief in insomnia.  ? ? ?  01/15/2022  ?  8:06 AM 01/08/2022  ? 10:59 AM 12/27/2021  ? 11:52 AM  ?Depression screen PHQ 2/9  ?Decreased Interest '1 3 2  '$ ?Down, Depressed, Hopeless '1 3 2  '$ ?PHQ - 2 Score '2 6 4  '$ ?Altered sleeping '1 3 2  '$ ?Tired, decreased energy '1 1 2  '$ ?Change in appetite 0 1 2  ?Feeling bad or failure about yourself  0 0 2  ?Trouble concentrating 0 3 2  ?Moving slowly or fidgety/restless 0 0 2  ?Suicidal thoughts 0 0 0  ?PHQ-9 Score '4 14 16  '$ ?Difficult doing work/chores Very difficult Extremely dIfficult Extremely dIfficult  ? ? ? ?  01/15/2022  ?  8:07 AM 01/08/2022  ? 10:59 AM 12/27/2021  ? 11:53 AM 12/11/2021  ?  8:04 AM  ?GAD 7 : Generalized Anxiety Score  ?Nervous, Anxious, on Edge '3 2 3 1  '$ ?Control/stop worrying '3 2 3 1  '$ ?Worry too much - different things '3 2 3 1  '$ ?Trouble relaxing '3 2 3 1  '$ ?Restless '3 2 3 '$ 0  ?Easily annoyed or irritable '3 2 3 3  '$ ?Afraid - awful might happen 0 0 3 0  ?Total GAD 7 Score  '18 12 21 7  '$ ?Anxiety Difficulty Very difficult Extremely difficult Extremely difficult Somewhat difficult  ? ? ? ? ?ROS ?As per HPI. ?  ?Objective:  ?  ? ?BP (!) 160/92 Comment: home reading  Pulse 80   Temp 97.9 ?F (36.6 ?C) (Temporal)   Ht '5\' 6"'$  (1.676 m)   Wt 157 lb 6 oz (71.4 kg)   BMI 25.40 kg/m?   ? ?Physical Exam ?Vitals and nursing note reviewed.  ?Constitutional:   ?   General: She is not in acute distress. ?   Appearance: She is not ill-appearing, toxic-appearing or diaphoretic.  ?Eyes:  ?   Extraocular Movements: Extraocular movements intact.  ?   Pupils: Pupils are equal, round, and reactive to light.  ?Neck:  ?   Vascular: No carotid bruit or JVD.  ?Cardiovascular:  ?   Rate and Rhythm: Normal rate and regular rhythm.  ?   Heart sounds: Normal heart sounds. No murmur heard. ?Pulmonary:  ?   Effort: Pulmonary effort is normal. No respiratory distress.  ?   Breath sounds: Normal breath sounds.  ?Musculoskeletal:     ?   General: No  swelling or tenderness.  ?   Right lower leg: No edema.  ?   Left lower leg: No edema.  ?Skin: ?   General: Skin is warm and dry.  ?   Findings: No erythema or rash.  ?Neurological:  ?   General: No focal deficit present.  ?   Mental Status: She is alert and oriented to person, place, and time.  ?   Motor: No weakness.  ?Psychiatric:     ?   Mood and Affect: Mood normal.     ?   Behavior: Behavior normal.     ?   Thought Content: Thought content normal.     ?   Judgment: Judgment normal.  ? ? ? ?No results found for any visits on 01/15/22. ? ? ? ?The 10-year ASCVD risk score (Arnett DK, et al., 2019) is: 41.9% ? ?  ?Assessment & Plan:  ?Arieon was seen today for hypertension. ? ?Diagnoses and all orders for this visit: ? ?Uncontrolled hypertension ?Asymptomatic, benign exam today. Discussed medicaiton compliance. Continue to monitor BP. Will continue to taper off prednisone.  ? ?Pain in joint, multiple sites ?Resolved. Currently on prednisone 5 mg. Disucssed 4 mg x 2 days,  3 mg x 2 days, 2 mg x 2 day, 1 mg x 2 days then discontinue.  ? ?Itching ?Prednisone side effect? ?-     triamcinolone ointment (KENALOG) 0.5 %; Apply 1 application. topically 2 (two) times daily. ? ?RMSF [facility] spotted fever) ?Will rechecked labs next week which will be 4 weeks from initial ImG positive. Was treated with doxycyline.  ? ?Insomnia due to medical condition ?Increase trazodone to 100 mg nightly.  ?-     traZODone (DESYREL) 100 MG tablet; Take 1 tablet (100 mg total) by mouth at bedtime as needed for sleep. ? ? ?Return in about 9 days (around 01/24/2022).  ? ?The patient indicates understanding of these issues and agrees with the plan. ? ?Gwenlyn Perking, FNP ? ?

## 2022-01-16 ENCOUNTER — Telehealth: Payer: Self-pay | Admitting: Family Medicine

## 2022-01-16 NOTE — Telephone Encounter (Signed)
MS diagnosis was Patient Reported. [name] states that he cannot find any data to support this. Just an FYI  ?

## 2022-01-16 NOTE — Telephone Encounter (Signed)
Nurse from [facility] called requesting to speak with nurse regarding patient. ? ?Can call nurse back at [phone] ?

## 2022-01-18 ENCOUNTER — Ambulatory Visit: Payer: PPO | Admitting: Pharmacist

## 2022-01-18 DIAGNOSIS — E785 Hyperlipidemia, unspecified: Secondary | ICD-10-CM

## 2022-01-18 DIAGNOSIS — E1169 Type 2 diabetes mellitus with other specified complication: Secondary | ICD-10-CM

## 2022-01-18 NOTE — Progress Notes (Signed)
? ? ?Chronic Care Management ?Pharmacy Note ? ?01/18/2022 ?Name:  [name] MRN:  [id number] DOB:  [birth year] ? ?Summary: ? ?Diabetes: Goal on track: NO. ?Uncontrolled-per libre report-post prandials have increased, AVG BG is 220; currently on steroids for 2 weeks;  GFR 58 ?Past intolerances: metformin, farxiga (SGLT2s) ?current treatment: tresiba 54 units, novolog 20 units 2x w/ meals, ozempic 35m;  ?Increase tresiba to 60 units, novolog 25-30 units with meals (2x daily, only eats 2 meals), continue ozempic (could consider increase to 249mas needed) ?Refills sent for patient assistance with novo nordisk 2023 (tresiba, novolog, ozempic) ?Denies hypoglycemia (previously had) ?Discussed meal planning options and Plate method for healthy eating ?Avoid sugary drinks and desserts ?Incorporate balanced protein, non starchy veggies, 1 serving of carbohydrate with each meal ?Increase water intake ?Increase physical activity as able ?Discussed FOLLOWING A HEART HEALTHY DIET/HEALTHY PLATE METHOD ?Lipids currently controlled on rosuvastatin 4055mCurrent exercise: n/a ?Recommended INCREASE tresiba to 60 units, Novolog meal time ~25-30 units depending on carb (low,med,high)--discussed with patient; she has omitted some doses of novolog; encouraged compliance with meal time insulin to keep controlled ? ?Subjective: ?[name] an 71 78o. year old female who is a primary patient of MorGwenlyn PerkingNP.  The CCM team was consulted for assistance with disease management and care coordination needs.   ? ?Engaged with patient by telephone for follow up visit in response to provider referral for pharmacy case management and/or care coordination services.  ? ?Consent to Services:  ?The patient was given information about Chronic Care Management services, agreed to services, and gave verbal consent prior to initiation of services.  Please see initial visit note for detailed documentation.  ? ?Patient Care Team: ?MorGwenlyn PerkingNP as PCP - General (Family Medicine) ?Rourk, RobCristopher EstimableD (Gastroenterology) ?HocMinus BreedingD (Cardiology) ?HudIlean ChinaN as RegEquities tradere,Harlen LabsD as Referring Physician (Optometry) ?PruLavera GuisePHUnion [facility]al Institute [facility] Pharmacist (Family Medicine) ? ?Objective: ? ?Lab Results  ?Component Value Date  ? CREATININE 0.86 12/27/2021  ? CREATININE 0.94 12/24/2021  ? CREATININE 0.94 12/11/2021  ? ? ?Lab Results  ?Component Value Date  ? HGBA1C 8.0 (H) 11/02/2021  ? ?Last diabetic Eye exam:  ?Lab Results  ?Component Value Date/Time  ? HMDIABEYEEXA No Retinopathy 11/29/2015 12:00 AM  ?  ?Last diabetic Foot exam: No results found for: HMDIABFOOTEX  ? ?   ?Component Value Date/Time  ? CHOL 130 08/09/2021 0959  ? CHOL 107 02/05/2013 1002  ? TRIG 93 08/09/2021 0959  ? TRIG 149 11/30/2015 0947  ? TRIG 143 02/05/2013 1002  ? HDL 39 (L) 08/09/2021 0959  ? HDL 35 (L) 11/30/2015 0947  ? HDL 35 (L) 02/05/2013 1002  ? CHOLHDL 3.3 08/09/2021 0959  ? CHOLHDL 3.9 09/22/2017 0518  ? VLDL 26 09/22/2017 0518  ? LDLLove 08/09/2021 0959  ? LDLCALC 195 (H) 04/26/2014 1029  ? LDLCALC 43 02/05/2013 1002  ? LDLDIRECT 93 04/06/2015 1038  ? ? ? ?  Latest Ref Rng & Units 12/27/2021  ? 12:25 PM 12/24/2021  ?  6:10 AM 12/11/2021  ?  8:54 AM  ?Hepatic Function  ?Total Protein 6.0 - 8.5 g/dL 6.6   7.2   6.7    ?Albumin 3.7 - 4.7 g/dL 3.9   3.4   4.2    ?AST 0 - 40 IU/L 12   18   17     ?ALT 0 - 32 IU/L 9  15   8    ?Alk Phosphatase 44 - 121 IU/L 69   52   54    ?Total Bilirubin 0.0 - 1.2 mg/dL 0.3   0.7   0.4    ? ? ?Lab Results  ?Component Value Date/Time  ? TSH 2.110 12/11/2021 08:54 AM  ? TSH 3.372 11/15/2020 08:14 PM  ? TSH 1.950 04/23/2018 09:43 AM  ? FREET4 1.16 04/17/2007 06:30 AM  ? ? ? ?  Latest Ref Rng & Units 12/27/2021  ? 12:25 PM 12/24/2021  ?  6:10 AM 12/11/2021  ?  8:54 AM  ?CBC  ?WBC 3.4 - 10.8 x10E3/uL 8.7   8.2   7.3    ?Hemoglobin 11.1 - 15.9 g/dL 12.1   11.8   12.1    ?Hematocrit 34.0 - 46.6 % 37.1   35.3   35.5     ?Platelets 150 - 450 x10E3/uL 190   167   178    ? ? ?Lab Results  ?Component Value Date/Time  ? VD25OH 90.28 11/15/2020 08:14 PM  ? VD25OH 45.4 02/20/2019 11:52 AM  ? ? ?Clinical ASCVD: No  ?The 10-year ASCVD risk score (Arnett DK, et al., 2019) is: 41.9% ?  Values used to calculate the score: ?    Age: 57 years ?    Sex: Female ?    Is Non-Hispanic African American: No ?    Diabetic: Yes ?    Tobacco smoker: No ?    Systolic Blood Pressure: 893 mmHg ?    Is BP treated: Yes ?    HDL Cholesterol: 39 mg/dL ?    Total Cholesterol: 130 mg/dL   ? ?Other: (CHADS2VASc if Afib, PHQ9 if depression, MMRC or CAT for COPD, ACT, DEXA) ? ?Social History  ? ?Tobacco Use  ?Smoking Status Never  ?Smokeless Tobacco Never  ?Tobacco Comments  ? spouse, 53 years - husband has quit 01/2011  ? ?BP Readings from Last 3 Encounters:  ?01/15/22 (!) 160/92  ?01/08/22 (!) 155/86  ?12/27/21 (!) 160/90  ? ?Pulse Readings from Last 3 Encounters:  ?01/15/22 80  ?01/08/22 87  ?12/27/21 99  ? ?Wt Readings from Last 3 Encounters:  ?01/15/22 157 lb 6 oz (71.4 kg)  ?01/08/22 156 lb 8 oz (71 kg)  ?12/27/21 153 lb (69.4 kg)  ? ? ?Assessment: Review of patient past medical history, allergies, medications, health status, including review of consultants reports, laboratory and other test data, was performed as part of comprehensive evaluation and provision of chronic care management services.  ? ?SDOH:  (Social Determinants of Health) assessments and interventions performed:  ? ? ?CCM Care Plan ? ?Allergies  ?Allergen Reactions  ? Iohexol   ?   Desc: pt had syncopal episode with nausea post IV CM late 1990's,  pt has had prednisone prep with heart caths x 2 without problem  kdean 04/16/07, Onset Date: [id number] ?  ? Ticlid [Ticlopidine Hcl] Nausea And Vomiting  ? Jardiance [Empagliflozin] Other (See Comments)  ?  Recurrent UTIs  ? Metformin And Related Diarrhea  ? Codeine Nausea And Vomiting and Palpitations  ? ? ?Medications Reviewed Today   ? ? Reviewed by  Lavera Guise, [facility] (Pharmacist) on 01/18/22 at 1604  Med List Status: <None>  ? ?Medication Order Taking? Sig Documenting Provider Last Dose Status Informant  ?amLODipine (NORVASC) 10 MG tablet [id number] No Take 1 tablet (10 mg total) by mouth daily. Roxan Hockey, MD Taking Active Self  ?aspirin 325 MG tablet [id number] No  Take 325 mg by mouth daily. [provider] Taking Active Self  ?atenolol (TENORMIN) 50 MG tablet [id number] No Take 1 tablet (50 mg total) by mouth 2 (two) times daily. Gwenlyn Perking, FNP Taking Active   ?calcium carbonate (OS-CAL) 600 MG TABS tablet [id number] No Take 1 tablet (600 mg total) by mouth 2 (two) times daily with a meal. Gwenlyn Perking, FNP Taking Active Self  ?citalopram (CELEXA) 40 MG tablet [id number] No Take 40 mg by mouth daily. [provider] Taking Active   ?Continuous Blood Gluc Receiver (FREESTYLE LIBRE 2 READER) DEVI [id number] No USE TO TEST BLOOD SUGAR 6 TIMES DAILY. Dx:E11.65 Gwenlyn Perking, FNP Taking Active   ?Continuous Blood Gluc Sensor (FREESTYLE LIBRE 2 SENSOR) MISC [id number] No USE TO TEST BLOOD SUGAR 6 TIMES DAILY. Dx:E11.65 Gwenlyn Perking, FNP Taking Active   ?esomeprazole (NEXIUM) 40 MG capsule [id number] No Take 1 capsule (40 mg total) by mouth daily. Loman Brooklyn, FNP Taking Active Self  ?furosemide (LASIX) 20 MG tablet [id number] No TAKE ONE (1) TABLET EACH DAY Rakes, Connye Burkitt, FNP Taking Active   ?hydrOXYzine (ATARAX/VISTARIL) 10 MG tablet [id number] No Take 1 tablet (10 mg total) by mouth 3 (three) times daily as needed. Gwenlyn Perking, FNP Taking Active   ?insulin degludec (TRESIBA FLEXTOUCH) 100 UNIT/ML FlexTouch Pen [id number] No Inject 60 Units into the skin daily. [provider] Taking Active   ?         ?Med Note Blanca Friend, Domenico Achord D   Tue Jan 02, 2022  9:27 PM) Via novo nordisk patient assistance program  ?isosorbide mononitrate (IMDUR) 60 MG 24 hr tablet [id number] No TAKE ONE (1) TABLET EACH DAY Gwenlyn Perking, FNP Taking Active   ?meclizine (ANTIVERT) 25 MG tablet [id number] No Take 1 tablet (25 mg total) by mouth 3 (three) times daily as needed for dizziness. Dettinger, Fransisca Kaufmann, MD Taking Active Self  ?

## 2022-01-18 NOTE — Patient Instructions (Signed)
Visit Information ? ?Following are the goals we discussed today:  ?Current Barriers:  ?Unable to independently afford treatment regimen ?Unable to achieve control of T2DM  ?Unable to maintain control of T2DM ? ?Pharmacist Clinical Goal(s):  ?patient will verbalize ability to afford treatment regimen ?achieve control of t2dm as evidenced by goal a1c<7% through collaboration with PharmD and provider.  ? ?Interventions: ?1:1 collaboration with Gwenlyn Perking, FNP regarding development and update of comprehensive plan of care as evidenced by provider attestation and co-signature ?Inter-disciplinary care team collaboration (see longitudinal plan of care) ?Comprehensive medication review performed; medication list updated in electronic medical record ? ?Diabetes: Goal on track: NO. ?Uncontrolled-per libre report-post prandials have increased, AVG BG is 220; currently on steroids for 2 weeks;  GFR 58 ?Past intolerances: metformin, farxiga (SGLT2s) ?current treatment: tresiba 54 units, novolog 20 units 2x w/ meals, ozempic '1mg'$ ;  ?Increase tresiba to 60 units, novolog 25-30 units with meals (2x daily, only eats 2 meals), continue ozempic (could consider increase to '2mg'$  as needed) ?Refills sent for patient assistance with novo nordisk 2023 (tresiba, novolog, ozempic) ?Denies hypoglycemia (previously had) ?Discussed meal planning options and Plate method for healthy eating ?Avoid sugary drinks and desserts ?Incorporate balanced protein, non starchy veggies, 1 serving of carbohydrate with each meal ?Increase water intake ?Increase physical activity as able ?Discussed FOLLOWING A HEART HEALTHY DIET/HEALTHY PLATE METHOD ?Lipids currently controlled on rosuvastatin '40mg'$  ?Current exercise: n/a ?Recommended INCREASE tresiba to 60 units, Novolog meal time ~30 units depending on carb (low,med,high)--discussed with patient; she has omitted some doses of novolog; encouraged compliance with meal time insulin to keep  controlled ? ? ?Patient Goals/Self-Care Activities ?patient will:  ?- take medications as prescribed as evidenced by patient report and record review ?check glucose using libre 2 CGM, document, and provide at future appointments ?collaborate with provider on medication access solutions ? ? ?Plan: Telephone follow up appointment with care management team member scheduled for:  2 weeks ? ?Signature ?Regina Eck, PharmD, BCPS ?Clinical Pharmacist, [facility] ?[place]  [facility] Phone [phone] ? ? ?Please call the care guide team at [phone] if you need to cancel or reschedule your appointment.  ? ?The patient verbalized understanding of instructions, educational materials, and care plan provided today and declined offer to receive copy of patient instructions, educational materials, and care plan.  ? ?

## 2022-01-21 DIAGNOSIS — E1169 Type 2 diabetes mellitus with other specified complication: Secondary | ICD-10-CM

## 2022-01-21 DIAGNOSIS — E785 Hyperlipidemia, unspecified: Secondary | ICD-10-CM

## 2022-01-21 DIAGNOSIS — E1165 Type 2 diabetes mellitus with hyperglycemia: Secondary | ICD-10-CM

## 2022-01-22 ENCOUNTER — Ambulatory Visit (INDEPENDENT_AMBULATORY_CARE_PROVIDER_SITE_OTHER): Payer: PPO

## 2022-01-22 VITALS — Wt 157.0 lb

## 2022-01-22 DIAGNOSIS — Z0000 Encounter for general adult medical examination without abnormal findings: Secondary | ICD-10-CM

## 2022-01-22 NOTE — Progress Notes (Signed)
? ?Subjective:  ? [name] is a 71 y.o. female who presents for Medicare Annual (Subsequent) preventive examination. ? ?Virtual Visit via Telephone Note ? ?I connected with  [name] on 01/22/22 at  3:30 PM EDT by telephone and verified that I am speaking with the correct person using two identifiers. ? ?Location: ?Patient: Home ?Provider: [facility] ?Persons participating in the virtual visit: patient/Nurse Health Advisor ?  ?I discussed the limitations, risks, security and privacy concerns of performing an evaluation and management service by telephone and the availability of in person appointments. The patient expressed understanding and agreed to proceed. ? ?Interactive audio and video telecommunications were attempted between this nurse and patient, however failed, due to patient having technical difficulties OR patient did not have access to video capability.  We continued and completed visit with audio only. ? ?Some vital signs may be absent or patient reported.  ? ?Karen Gorley Dionne Ano, LPN  ? ?Review of Systems    ? ?Cardiac Risk Factors include: advanced age (>27mn, >>67women);diabetes mellitus;dyslipidemia;hypertension;sedentary lifestyle;Other (see comment), Risk factor comments: CAD, CHF, hx of Stroke ? ?   ?Objective:  ?  ?Today's Vitals  ? 01/22/22 1526  ?Weight: 157 lb (71.2 kg)  ?PainSc: 4   ? ?Body mass index is 25.34 kg/m?. ? ? ?  01/22/2022  ?  3:38 PM 01/20/2021  ?  9:07 AM 11/15/2020  ?  7:23 PM 10/10/2020  ? 10:28 PM 09/24/2020  ?  1:08 PM 04/07/2020  ?  8:47 PM 01/20/2020  ?  8:42 AM  ?Advanced Directives  ?Does Patient Have a Medical Advance Directive? Yes No No No No No No  ?Type of AParamedicof [place]uch[place]Living will        ?Copy of [place] Chart? No - copy requested        ?Would patient like information on creating a medical advance directive?  No - Patient declined No - Patient declined No - Patient declined   No - Patient declined  ? ? ?Current  Medications (verified) ?Outpatient Encounter Medications as of 01/22/2022  ?Medication Sig  ? amLODipine (NORVASC) 10 MG tablet Take 1 tablet (10 mg total) by mouth daily.  ? aspirin 325 MG tablet Take 325 mg by mouth daily.  ? atenolol (TENORMIN) 50 MG tablet Take 1 tablet (50 mg total) by mouth 2 (two) times daily.  ? calcium carbonate (OS-CAL) 600 MG TABS tablet Take 1 tablet (600 mg total) by mouth 2 (two) times daily with a meal.  ? citalopram (CELEXA) 40 MG tablet Take 40 mg by mouth daily.  ? Continuous Blood Gluc Receiver (FREESTYLE LIBRE 2 READER) DEVI USE TO TEST BLOOD SUGAR 6 TIMES DAILY. Dx:E11.65  ? Continuous Blood Gluc Sensor (FREESTYLE LIBRE 2 SENSOR) MISC USE TO TEST BLOOD SUGAR 6 TIMES DAILY. Dx:E11.65  ? furosemide (LASIX) 20 MG tablet TAKE ONE (1) TABLET EACH DAY  ? hydrOXYzine (ATARAX/VISTARIL) 10 MG tablet Take 1 tablet (10 mg total) by mouth 3 (three) times daily as needed.  ? insulin degludec (TRESIBA FLEXTOUCH) 100 UNIT/ML FlexTouch Pen Inject 60 Units into the skin daily.  ? isosorbide mononitrate (IMDUR) 60 MG 24 hr tablet TAKE ONE (1) TABLET EACH DAY  ? meclizine (ANTIVERT) 25 MG tablet Take 1 tablet (25 mg total) by mouth 3 (three) times daily as needed for dizziness.  ? meloxicam (MOBIC) 15 MG tablet Take 1 tablet (15 mg total) by mouth daily.  ? NOVOLOG  FLEXPEN 100 UNIT/ML FlexPen INJECT 35-40 UNITS SQ 3 TIMES DAILY WITH MEALS (Patient taking differently: 20-30 Units 3 (three) times daily with meals. Takes 8-30 units TID with meals depending on glucose)  ? ondansetron (ZOFRAN) 8 MG tablet Take 0.5 tablets (4 mg total) by mouth every 6 (six) hours as needed.  ? potassium chloride (KLOR-CON M) 10 MEQ tablet Take 1 tablet (10 mEq total) by mouth daily.  ? rOPINIRole (REQUIP) 0.5 MG tablet Take 2 tablets (1 mg total) by mouth at bedtime.  ? rosuvastatin (CRESTOR) 40 MG tablet TAKE ONE (1) TABLET EACH DAY (Patient taking differently: Take 40 mg by mouth daily.)  ? Semaglutide, 1 MG/DOSE,  (OZEMPIC, 1 MG/DOSE,) 2 MG/1.5ML SOPN Inject 1 mg into the skin once a week.  ? sertraline (ZOLOFT) 50 MG tablet Take 1 tablet (50 mg total) by mouth daily.  ? traZODone (DESYREL) 100 MG tablet Take 1 tablet (100 mg total) by mouth at bedtime as needed for sleep.  ? triamcinolone ointment (KENALOG) 0.5 % Apply 1 application. topically 2 (two) times daily.  ? vitamin B-12 (CYANOCOBALAMIN) 1000 MCG tablet Take 1,000 mcg by mouth daily.  ? Vitamin D, Ergocalciferol, (DRISDOL) 1.25 MG (50000 UNIT) CAPS capsule TAKE 1 CAPSULE EVERY 7 DAYS  ? nitroGLYCERIN (NITROSTAT) 0.4 MG SL tablet DISSOLVE 1 TABLET UNDER TONGUE FOR CHESTPAIN.MAY REPEAT EVERY 5 MINUTES FOR 3 DOSES.IF NO RELIEF CALL 911 OR GO TO ER (Patient not taking: Reported on 01/22/2022)  ? [DISCONTINUED] esomeprazole (NEXIUM) 40 MG capsule Take 1 capsule (40 mg total) by mouth daily. (Patient not taking: Reported on 01/22/2022)  ? ?No facility-administered encounter medications on file as of 01/22/2022.  ? ? ?Allergies (verified) ?Iohexol, Ticlid [ticlopidine hcl], Jardiance [empagliflozin], Metformin and related, and Codeine  ? ?History: ?Past Medical History:  ?Diagnosis Date  ? Anxiety   ? CAD (coronary artery disease)   ? DES to circumflex 02/2007, BMS to LAD and PTCA diagonal 03/2007  ? Carotid artery plaque   ? Mild  ? Cataract   ? Depression   ? Diverticulitis, colon   ? Elevated d-dimer 01/08/2014  ? Essential hypertension, benign   ? GERD (gastroesophageal reflux disease)   ? H/O hiatal hernia   ? HLD (hyperlipidemia)   ? IDDM (insulin dependent diabetes mellitus)   ? Migraine   ? "used to have them really bad; don't have them anymore" (01/07/2014)  ? MS (multiple sclerosis) ([place])   ? Not confirmed  ? PAT (paroxysmal atrial tachycardia) ([place])   ? Prolapse of uterus   ? PVD (peripheral vascular disease) ([place])   ? TIA (transient ischemic attack) 1980's  ? ?Past Surgical History:  ?Procedure Laterality Date  ? ABDOMINAL HYSTERECTOMY  1986  ? ovaries remain - prolaspe  uterus   ? APPENDECTOMY  ~ 1970  ? BREAST BIOPSY Right 1980's  ? BREAST LUMPECTOMY Right 1980's  ? Dr. Charlynne Pander   ? CARDIAC CATHETERIZATION  01/07/2014  ? CHOLECYSTECTOMY  ?1987  ? COLONOSCOPY  2002  ? Dr. Rowe Pavy Severe diverticular changes in the region of the sigmoid and descending colon with scattered diverticular changes throughout the rest of the colon. No polyps, ulcerations. Despite numerous manipulations, the tip of the scope could not be tipped into the cecal area.  ? COLONOSCOPY  01/10/2012  ? Procedure: COLONOSCOPY;  Surgeon: Daneil Dolin, MD;  [facility]: [facility];  Service: Endoscopy;  Laterality: N/A;  1:55  ? CORONARY ANGIOPLASTY WITH STENT PLACEMENT  ~ 1997 X 2  ? "  2 + 1"  ? EYE SURGERY Bilateral 2014  ? cataract  ? INTRAVASCULAR PRESSURE WIRE/FFR STUDY N/A 03/08/2017  ? Procedure: Intravascular Pressure Wire/FFR Study;  Surgeon: Nelva Bush, MD;  Location: [facility];  Service: Cardiovascular;  Laterality: N/A;  ? LEFT HEART CATH AND CORONARY ANGIOGRAPHY N/A 03/08/2017  ? Procedure: Left Heart Cath and Coronary Angiography;  Surgeon: Nelva Bush, MD;  Location: [facility];  Service: Cardiovascular;  Laterality: N/A;  ? LEFT HEART CATHETERIZATION WITH CORONARY ANGIOGRAM N/A 01/07/2014  ? Procedure: LEFT HEART CATHETERIZATION WITH CORONARY ANGIOGRAM;  Surgeon: Larey Dresser, MD;  Location: [facility] CATH LAB;  Service: Cardiovascular;  Laterality: N/A;  ? ?Family History  ?Problem Relation Age of Onset  ? Heart attack Mother 67  ? Diabetes Mother   ? Hypertension Mother   ? Heart attack Father 66  ? Heart attack Brother 32  ?     x 6  ? Heart disease Brother   ? Diabetes Brother   ? Colon cancer Paternal Aunt   ?     31s, died with brain anuerysm  ? Crohn's disease Cousin   ?     paternal  ? Diabetes Sister   ? GER disease Daughter   ? Cervical cancer Daughter   ? Diabetes Daughter   ? ?Social History  ? ?Socioeconomic History  ? Marital status: Widowed  ?  Spouse name: Not on  file  ? Number of children: 4  ? Years of education: 16  ? Highest education level: 11th grade  ?Occupational History  ? Occupation: Disability  ?  Employer: DISABLED  ?Tobacco Use  ? Smoking status: Never  ?

## 2022-01-22 NOTE — Patient Instructions (Signed)
Ms. Montejano , ?Thank you for taking time to come for your Medicare Wellness Visit. I appreciate your ongoing commitment to your health goals. Please review the following plan we discussed and let me know if I can assist you in the future.  ? ?Screening recommendations/referrals: ?Colonoscopy: Done 01/10/2012 - declines repeat ?Mammogram: Done 06/16/2021 @  [facility] [place] - Repeat annually ?Bone Density: Done 07/14/2020 - Repeat every 2 years ?Recommended yearly ophthalmology/optometry visit for glaucoma screening and checkup ?Recommended yearly dental visit for hygiene and checkup ? ?Vaccinations: ?Influenza vaccine: Done 06/09/2021 - Repeat annually ?Pneumococcal vaccine: Done 02/08/2014 & 06/28/2015 ?Tdap vaccine: Done 12/11/2021 - Repeat in 10 years ?Shingles vaccine: Done 12/11/2021 - get second dose at your next appt   ?Covid-19: Done 11/19/2019 & 12/18/2019 - declines boosters ? ?Advanced directives: Please bring a copy of your health care power of attorney and living will to the office to be added to your chart at your convenience. ? ?Conditions/risks identified: Aim for 30 minutes of exercise or brisk walking, 6-8 glasses of water, and 5 servings of fruits and vegetables each day. ? ?Next appointment: Follow up in one year for your annual wellness visit  ? ? ?Preventive Care 73 Years and Older, Female ?Preventive care refers to lifestyle choices and visits with your health care provider that can promote health and wellness. ?What does preventive care include? ?A yearly physical exam. This is also called an annual well check. ?Dental exams once or twice a year. ?Routine eye exams. Ask your health care provider how often you should have your eyes checked. ?Personal lifestyle choices, including: ?Daily care of your teeth and gums. ?Regular physical activity. ?Eating a healthy diet. ?Avoiding tobacco and drug use. ?Limiting alcohol use. ?Practicing safe sex. ?Taking low-dose aspirin every day. ?Taking  vitamin and mineral supplements as recommended by your health care provider. ?What happens during an annual well check? ?The services and screenings done by your health care provider during your annual well check will depend on your age, overall health, lifestyle risk factors, and family history of disease. ?Counseling  ?Your health care provider may ask you questions about your: ?Alcohol use. ?Tobacco use. ?Drug use. ?Emotional well-being. ?Home and relationship well-being. ?Sexual activity. ?Eating habits. ?History of falls. ?Memory and ability to understand (cognition). ?Work and work Statistician. ?Reproductive health. ?Screening  ?You may have the following tests or measurements: ?Height, weight, and BMI. ?Blood pressure. ?Lipid and cholesterol levels. These may be checked every 5 years, or more frequently if you are over 52 years old. ?Skin check. ?Lung cancer screening. You may have this screening every year starting at age 67 if you have a 30-pack-year history of smoking and currently smoke or have quit within the past 15 years. ?Fecal occult blood test (FOBT) of the stool. You may have this test every year starting at age 32. ?Flexible sigmoidoscopy or colonoscopy. You may have a sigmoidoscopy every 5 years or a colonoscopy every 10 years starting at age 13. ?Hepatitis C blood test. ?Hepatitis B blood test. ?Sexually transmitted disease (STD) testing. ?Diabetes screening. This is done by checking your blood sugar (glucose) after you have not eaten for a while (fasting). You may have this done every 1-3 years. ?Bone density scan. This is done to screen for osteoporosis. You may have this done starting at age 55. ?Mammogram. This may be done every 1-2 years. Talk to your health care provider about how often you should have regular mammograms. ?Talk with your health care  provider about your test results, treatment options, and if necessary, the need for more tests. ?Vaccines  ?Your health care provider may  recommend certain vaccines, such as: ?Influenza vaccine. This is recommended every year. ?Tetanus, diphtheria, and acellular pertussis (Tdap, Td) vaccine. You may need a Td booster every 10 years. ?Zoster vaccine. You may need this after age 25. ?Pneumococcal 13-valent conjugate (PCV13) vaccine. One dose is recommended after age 73. ?Pneumococcal polysaccharide (PPSV23) vaccine. One dose is recommended after age 14. ?Talk to your health care provider about which screenings and vaccines you need and how often you need them. ?This information is not intended to replace advice given to you by your health care provider. Make sure you discuss any questions you have with your health care provider. ?Document Released: 10/07/2015 Document Revised: 05/30/2016 Document Reviewed: 07/12/2015 ?Elsevier Interactive Patient Education ? 2017 [employer]. ? ?Fall Prevention in the Home ?Falls can cause injuries. They can happen to people of all ages. There are many things you can do to make your home safe and to help prevent falls. ?What can I do on the outside of my home? ?Regularly fix the edges of walkways and driveways and fix any cracks. ?Remove anything that might make you trip as you walk through a door, such as a raised step or threshold. ?Trim any bushes or trees on the path to your home. ?Use bright outdoor lighting. ?Clear any walking paths of anything that might make someone trip, such as rocks or tools. ?Regularly check to see if handrails are loose or broken. Make sure that both sides of any steps have handrails. ?Any raised decks and porches should have guardrails on the edges. ?Have any leaves, snow, or ice cleared regularly. ?Use sand or salt on walking paths during winter. ?Clean up any spills in your garage right away. This includes oil or grease spills. ?What can I do in the bathroom? ?Use night lights. ?Install grab bars by the toilet and in the tub and shower. Do not use towel bars as grab bars. ?Use non-skid  mats or decals in the tub or shower. ?If you need to sit down in the shower, use a plastic, non-slip stool. ?Keep the floor dry. Clean up any water that spills on the floor as soon as it happens. ?Remove soap buildup in the tub or shower regularly. ?Attach bath mats securely with double-sided non-slip rug tape. ?Do not have throw rugs and other things on the floor that can make you trip. ?What can I do in the bedroom? ?Use night lights. ?Make sure that you have a light by your bed that is easy to reach. ?Do not use any sheets or blankets that are too big for your bed. They should not hang down onto the floor. ?Have a firm chair that has side arms. You can use this for support while you get dressed. ?Do not have throw rugs and other things on the floor that can make you trip. ?What can I do in the kitchen? ?Clean up any spills right away. ?Avoid walking on wet floors. ?Keep items that you use a lot in easy-to-reach places. ?If you need to reach something above you, use a strong step stool that has a grab bar. ?Keep electrical cords out of the way. ?Do not use floor polish or wax that makes floors slippery. If you must use wax, use non-skid floor wax. ?Do not have throw rugs and other things on the floor that can make you trip. ?What can I  do with my stairs? ?Do not leave any items on the stairs. ?Make sure that there are handrails on both sides of the stairs and use them. Fix handrails that are broken or loose. Make sure that handrails are as long as the stairways. ?Check any carpeting to make sure that it is firmly attached to the stairs. Fix any carpet that is loose or worn. ?Avoid having throw rugs at the top or bottom of the stairs. If you do have throw rugs, attach them to the floor with carpet tape. ?Make sure that you have a light switch at the top of the stairs and the bottom of the stairs. If you do not have them, ask someone to add them for you. ?What else can I do to help prevent falls? ?Wear shoes  that: ?Do not have high heels. ?Have rubber bottoms. ?Are comfortable and fit you well. ?Are closed at the toe. Do not wear sandals. ?If you use a stepladder: ?Make sure that it is fully opened. Do not climb a close

## 2022-01-24 ENCOUNTER — Ambulatory Visit (INDEPENDENT_AMBULATORY_CARE_PROVIDER_SITE_OTHER): Payer: PPO | Admitting: Family Medicine

## 2022-01-24 ENCOUNTER — Encounter: Payer: Self-pay | Admitting: Family Medicine

## 2022-01-24 ENCOUNTER — Telehealth: Payer: Self-pay | Admitting: Pharmacist

## 2022-01-24 ENCOUNTER — Ambulatory Visit: Payer: PPO | Admitting: Family Medicine

## 2022-01-24 VITALS — BP 146/75 | HR 80 | Temp 97.4°F | Ht 66.0 in | Wt 156.2 lb

## 2022-01-24 DIAGNOSIS — L299 Pruritus, unspecified: Secondary | ICD-10-CM | POA: Diagnosis not present

## 2022-01-24 DIAGNOSIS — E876 Hypokalemia: Secondary | ICD-10-CM

## 2022-01-24 DIAGNOSIS — Z8619 Personal history of other infectious and parasitic diseases: Secondary | ICD-10-CM | POA: Diagnosis not present

## 2022-01-24 DIAGNOSIS — R7982 Elevated C-reactive protein (CRP): Secondary | ICD-10-CM | POA: Diagnosis not present

## 2022-01-24 MED ORDER — LEVOCETIRIZINE DIHYDROCHLORIDE 5 MG PO TABS: 5.0000 mg | ORAL_TABLET | Freq: Every evening | ORAL | 2 refills | Status: DC

## 2022-01-24 NOTE — Telephone Encounter (Signed)
Refills faxed to [facility] patient assistance  ?For tresiba, novolog, ozempic ?Will ship to PCP office ?4 month supplies ?

## 2022-01-24 NOTE — Progress Notes (Signed)
? ?Assessment & Plan:  ?1. 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 ?2. Elevated C-reactive protein (CRP) ?- C-reactive protein ? ?3. Hypokalemia ?- BMP8+EGFR ? ?4. Pruritus ?- levocetirizine (XYZAL) 5 MG tablet; Take 1 tablet (5 mg total) by mouth every evening.  Dispense: 30 tablet; Refill: 2 ? ? ?Follow up plan: Return as scheduled. ? ?Hendricks Limes, MSN, APRN, FNP-C ?[place] ? ?Subjective:  ? ?Patient ID: [name], female    DOB: Mar 30, 1951, 71 y.o.   MRN: [id number] ? ?HPI: ?[name] is a 71 y.o. female presenting on 01/24/2022 for RMSF (9 day re check. Patient states she is still having body itching /) ? ?Patient is here to repeat lab work for RMSF per PCPs last note. ? ?Patient also reports she is continuing to itch all over.  This has been going on for the past 2-3 months, but she feels it is getting worse.  She has cut her nails back because she scratches so much.  She takes Benadryl as needed. ? ? ?ROS: Negative unless specifically indicated above in HPI.  ? ?Relevant past medical history reviewed and updated as indicated.  ? ?Allergies and medications reviewed and updated. ? ? ?Current Outpatient Medications:  ?  amLODipine (NORVASC) 10 MG tablet, Take 1 tablet (10 mg total) by mouth daily., Disp: 30 tablet, Rfl: 5 ?  aspirin 325 MG tablet, Take 325 mg by mouth daily., Disp: , Rfl:  ?  atenolol (TENORMIN) 50 MG tablet, Take 1 tablet (50 mg total) by mouth 2 (two) times daily., Disp: 180 tablet, Rfl: 3 ?  calcium carbonate (OS-CAL) 600 MG TABS tablet, Take 1 tablet (600 mg total) by mouth 2 (two) times daily with a meal., Disp: 180 tablet, Rfl: 3 ?  citalopram (CELEXA) 40 MG tablet, Take 40 mg by mouth daily., Disp: , Rfl:  ?  Continuous Blood Gluc Receiver (FREESTYLE LIBRE 2 READER) DEVI, USE TO TEST BLOOD SUGAR 6 TIMES DAILY. Dx:E11.65, Disp: 1 each, Rfl: 0 ?  Continuous Blood Gluc Sensor (FREESTYLE LIBRE 2 SENSOR) MISC, USE TO  TEST BLOOD SUGAR 6 TIMES DAILY. Dx:E11.65, Disp: 2 each, Rfl: 11 ?  furosemide (LASIX) 20 MG tablet, TAKE ONE (1) TABLET EACH DAY, Disp: 90 tablet, Rfl: 0 ?  hydrOXYzine (ATARAX/VISTARIL) 10 MG tablet, Take 1 tablet (10 mg total) by mouth 3 (three) times daily as needed., Disp: 30 tablet, Rfl: 0 ?  insulin degludec (TRESIBA FLEXTOUCH) 100 UNIT/ML FlexTouch Pen, Inject 60 Units into the skin daily., Disp: , Rfl:  ?  isosorbide mononitrate (IMDUR) 60 MG 24 hr tablet, TAKE ONE (1) TABLET EACH DAY, Disp: 30 tablet, Rfl: 5 ?  meclizine (ANTIVERT) 25 MG tablet, Take 1 tablet (25 mg total) by mouth 3 (three) times daily as needed for dizziness., Disp: 90 tablet, Rfl: 3 ?  meloxicam (MOBIC) 15 MG tablet, Take 1 tablet (15 mg total) by mouth daily., Disp: 30 tablet, Rfl: 0 ?  nitroGLYCERIN (NITROSTAT) 0.4 MG SL tablet, DISSOLVE 1 TABLET UNDER TONGUE FOR CHESTPAIN.MAY REPEAT EVERY 5 MINUTES FOR 3 DOSES.IF NO RELIEF CALL 911 OR GO TO ER, Disp: 25 tablet, Rfl: 6 ?  NOVOLOG FLEXPEN 100 UNIT/ML FlexPen, INJECT 35-40 UNITS SQ 3 TIMES DAILY WITH MEALS (Patient taking differently: 20-30 Units 3 (three) times daily with meals. Takes 8-30 units TID with meals depending on glucose), Disp: 30 mL, Rfl: 3 ?  ondansetron (ZOFRAN) 8 MG tablet, Take 0.5 tablets (  4 mg total) by mouth every 6 (six) hours as needed., Disp: 60 tablet, Rfl: 0 ?  potassium chloride (KLOR-CON M) 10 MEQ tablet, Take 1 tablet (10 mEq total) by mouth daily., Disp: 30 tablet, Rfl: 0 ?  prednisoLONE 5 MG TABS tablet, Take by mouth., Disp: , Rfl:  ?  rOPINIRole (REQUIP) 0.5 MG tablet, Take 2 tablets (1 mg total) by mouth at bedtime., Disp: 180 tablet, Rfl: 2 ?  rosuvastatin (CRESTOR) 40 MG tablet, TAKE ONE (1) TABLET EACH DAY (Patient taking differently: Take 40 mg by mouth daily.), Disp: 90 tablet, Rfl: 3 ?  Semaglutide, 1 MG/DOSE, (OZEMPIC, 1 MG/DOSE,) 2 MG/1.5ML SOPN, Inject 1 mg into the skin once a week., Disp: , Rfl:  ?  sertraline (ZOLOFT) 50 MG tablet, Take 1  tablet (50 mg total) by mouth daily., Disp: 30 tablet, Rfl: 3 ?  traZODone (DESYREL) 100 MG tablet, Take 1 tablet (100 mg total) by mouth at bedtime as needed for sleep., Disp: 90 tablet, Rfl: 1 ?  triamcinolone ointment (KENALOG) 0.5 %, Apply 1 application. topically 2 (two) times daily., Disp: 30 g, Rfl: 0 ?  vitamin B-12 (CYANOCOBALAMIN) 1000 MCG tablet, Take 1,000 mcg by mouth daily., Disp: , Rfl:  ?  Vitamin D, Ergocalciferol, (DRISDOL) 1.25 MG (50000 UNIT) CAPS capsule, TAKE 1 CAPSULE EVERY 7 DAYS, Disp: 12 capsule, Rfl: 3 ? ?Allergies  ?Allergen Reactions  ? Iohexol   ?   Desc: pt had syncopal episode with nausea post IV CM late 1990's,  pt has had prednisone prep with heart caths x 2 without problem  kdean 04/16/07, Onset Date: [id number] ?  ? Ticlid [Ticlopidine Hcl] Nausea And Vomiting  ? Jardiance [Empagliflozin] Other (See Comments)  ?  Recurrent UTIs  ? Metformin And Related Diarrhea  ? Codeine Nausea And Vomiting and Palpitations  ? ? ?Objective:  ? ?BP (!) 146/75   Pulse 80   Temp (!) 97.4 ?F (36.3 ?C) (Temporal)   Ht 5' 6"  (1.676 m)   Wt 156 lb 3.2 oz (70.9 kg)   BMI 25.21 kg/m?   ? ?Physical Exam ?Vitals reviewed.  ?Constitutional:   ?   General: She is not in acute distress. ?   Appearance: Normal appearance. She is not ill-appearing, toxic-appearing or diaphoretic.  ?HENT:  ?   Head: Normocephalic and atraumatic.  ?Eyes:  ?   General: No scleral icterus.    ?   Right eye: No discharge.     ?   Left eye: No discharge.  ?   Conjunctiva/sclera: Conjunctivae normal.  ?Cardiovascular:  ?   Rate and Rhythm: Normal rate.  ?Pulmonary:  ?   Effort: Pulmonary effort is normal. No respiratory distress.  ?Musculoskeletal:     ?   General: Normal range of motion.  ?   Cervical back: Normal range of motion.  ?Skin: ?   General: Skin is warm and dry.  ?   Capillary Refill: Capillary refill takes less than 2 seconds.  ?   Comments: Excoriation to left forearm and left lower extremity.  ?Neurological:  ?    General: No focal deficit present.  ?   Mental Status: She is alert and oriented to person, place, and time. Mental status is at baseline.  ?Psychiatric:     ?   Mood and Affect: Mood normal.     ?   Behavior: Behavior normal.     ?   Thought Content: Thought content normal.     ?  Judgment: Judgment normal.  ? ? ? ? ? ? ?

## 2022-01-25 ENCOUNTER — Other Ambulatory Visit: Payer: Self-pay | Admitting: Family Medicine

## 2022-01-25 DIAGNOSIS — R944 Abnormal results of kidney function studies: Secondary | ICD-10-CM

## 2022-01-26 LAB — BMP8+EGFR
BUN/Creatinine Ratio: 10 — ABNORMAL LOW (ref 12–28)
BUN: 13 mg/dL (ref 8–27)
CO2: 25 mmol/L (ref 20–29)
Calcium: 9.3 mg/dL (ref 8.7–10.3)
Chloride: 100 mmol/L (ref 96–106)
Creatinine, Ser: 1.26 mg/dL — ABNORMAL HIGH (ref 0.57–1.00)
Glucose: 191 mg/dL — ABNORMAL HIGH (ref 70–99)
Potassium: 4 mmol/L (ref 3.5–5.2)
Sodium: 140 mmol/L (ref 134–144)
eGFR: 45 mL/min/{1.73_m2} — ABNORMAL LOW (ref >59)

## 2022-01-26 LAB — ROCKY MTN SPOTTED FVR ABS PNL(IGG+IGM)
RMSF IgG: POSITIVE — AB
RMSF IgM: 0.47 index (ref 0.00–0.89)

## 2022-01-26 LAB — RMSF, IGG, IFA: RMSF, IGG, IFA: 1:256 {titer} — ABNORMAL HIGH

## 2022-01-26 LAB — C-REACTIVE PROTEIN: CRP: <1 mg/L (ref 0–10)

## 2022-01-29 ENCOUNTER — Other Ambulatory Visit: Payer: PPO

## 2022-01-29 ENCOUNTER — Telehealth: Payer: Self-pay | Admitting: Family Medicine

## 2022-01-29 DIAGNOSIS — R944 Abnormal results of kidney function studies: Secondary | ICD-10-CM

## 2022-01-29 NOTE — Telephone Encounter (Signed)
Pt is aware of provider feedback and also I reviewed the results from Britney's results notes and advised pt she needs to come back in to have her kidney function checked this week. Pt states she will come in Wednesday for the labs and the future orders are already in Epic. ?

## 2022-01-29 NOTE — Telephone Encounter (Signed)
RMSF titers increased from previous. RMSF is likely the cause of her symptoms that she presented to the ED for with weakness, fever, and rash. She was adequately treated with doxycyline, so no further treatment in needed. Please see result note from [facility] regarding other labs.  ?

## 2022-01-30 ENCOUNTER — Ambulatory Visit (INDEPENDENT_AMBULATORY_CARE_PROVIDER_SITE_OTHER): Payer: PPO | Admitting: Pharmacist

## 2022-01-30 ENCOUNTER — Other Ambulatory Visit: Payer: Self-pay | Admitting: Family Medicine

## 2022-01-30 DIAGNOSIS — E785 Hyperlipidemia, unspecified: Secondary | ICD-10-CM

## 2022-01-30 DIAGNOSIS — E1169 Type 2 diabetes mellitus with other specified complication: Secondary | ICD-10-CM

## 2022-01-30 LAB — BMP8+EGFR
BUN/Creatinine Ratio: 9 — ABNORMAL LOW (ref 12–28)
BUN: 11 mg/dL (ref 8–27)
CO2: 26 mmol/L (ref 20–29)
Calcium: 9.6 mg/dL (ref 8.7–10.3)
Chloride: 99 mmol/L (ref 96–106)
Creatinine, Ser: 1.27 mg/dL — ABNORMAL HIGH (ref 0.57–1.00)
Glucose: 336 mg/dL — ABNORMAL HIGH (ref 70–99)
Potassium: 4.6 mmol/L (ref 3.5–5.2)
Sodium: 139 mmol/L (ref 134–144)
eGFR: 45 mL/min/{1.73_m2} — ABNORMAL LOW (ref >59)

## 2022-01-30 NOTE — Patient Instructions (Signed)
Visit Information ? ?Following are the goals we discussed today:  ?Current Barriers:  ?Unable to independently afford treatment regimen ?Unable to achieve control of T2DM  ?Unable to maintain control of T2DM ? ?Pharmacist Clinical Goal(s):  ?patient will verbalize ability to afford treatment regimen ?achieve control of t2dm as evidenced by goal a1c<7% through collaboration with PharmD and provider.  ? ?Interventions: ?1:1 collaboration with Gwenlyn Perking, FNP regarding development and update of comprehensive plan of care as evidenced by provider attestation and co-signature ?Inter-disciplinary care team collaboration (see longitudinal plan of care) ?Comprehensive medication review performed; medication list updated in electronic medical record ? ?Diabetes: Goal on track: NO. ?Uncontrolled-per libre report-post prandials have increased, AVG BG is 220; GFR 71-->45 ?Reports FBG 115 ?Post prandials higher--instructed patient to take insulin 3mn before eating-->must cover meals ?Past intolerances: metformin, farxiga (SGLT2s) ?current treatment: tresiba 60 units, novolog 20 units 2x w/ meals, ozempic '1mg'$ ;  ?Continue tresiba 60 units, novolog 25-30 units with meals (2x daily, only eats 2 meals), continue ozempic (could consider increase to '2mg'$  as needed) ?Refills sent for patient assistance with novo nordisk 2023 (tresiba, novolog, ozempic) ?Denies hypoglycemia (previously had) ?Discussed meal planning options and Plate method for healthy eating ?Avoid sugary drinks and desserts ?Incorporate balanced protein, non starchy veggies, 1 serving of carbohydrate with each meal ?Increase water intake ?Increase physical activity as able ?Discussed FOLLOWING A HEART HEALTHY DIET/HEALTHY PLATE METHOD ?Lipids currently controlled on rosuvastatin '40mg'$  ?Current exercise: n/a ?Recommended Novolog meal time ~30 units depending on carb (low,med,high)--discussed with patient; she has omitted some doses of novolog; encouraged  compliance with meal time insulin to keep controlled ? ? ?Patient Goals/Self-Care Activities ?patient will:  ?- take medications as prescribed as evidenced by patient report and record review ?check glucose using libre 2 CGM, document, and provide at future appointments ?collaborate with provider on medication access solutions ? ? ?Plan: Telephone follow up appointment with care management team member scheduled for:  2 months ? ?Signature ?JRegina Eck PharmD, BCPS ?Clinical Pharmacist, [facility] ?[place] [facility] Phone [phone]? ? ?Please call the care guide team at [phone] you need to cancel or reschedule your appointment.  ? ?Patient verbalizes understanding of instructions and care plan provided today and agrees to view in [facility] My[facility] status confirmed with patient.   ? ?

## 2022-01-30 NOTE — Progress Notes (Signed)
? ? ?Chronic Care Management ?Pharmacy Note ? ?01/30/2022 ?Name:  [name] MRN:  [id number] DOB:  Jan 14, 1951 ? ?Summary: ? ?Diabetes: Goal on track: NO. ?Uncontrolled-per libre report-post prandials have increased, AVG BG is 220; GFR 71-->45 ?Reports FBG 115 ?Post prandials higher--instructed patient to take insulin 16mn before eating-->must cover meals ?Past intolerances: metformin, farxiga (SGLT2s) ?current treatment: tresiba 60 units, novolog 20 units 2x w/ meals, ozempic 180m  ?Continue tresiba 60 units, novolog 25-30 units with meals (2x daily, only eats 2 meals), continue ozempic (could consider increase to 31m45ms needed) ?Refills sent for patient assistance with novo nordisk 2023 (tresiba, novolog, ozempic) ?Denies hypoglycemia (previously had) ?Discussed meal planning options and Plate method for healthy eating; lipids slightly above goal (73), goal <70, continue diet/lifestyle modifications  ?Avoid sugary drinks and desserts ?Incorporate balanced protein, non starchy veggies, 1 serving of carbohydrate with each meal ?Increase water intake ?Increase physical activity as able ?Discussed FOLLOWING A HEART HEALTHY DIET/HEALTHY PLATE METHOD ?Lipids currently controlled on rosuvastatin 45m13murrent exercise: n/a ?Recommended Novolog meal time ~30 units depending on carb (low,med,high)--discussed with patient; she has omitted some doses of novolog; encouraged compliance with meal time insulin to keep controlled ? ?Subjective: ?Karen DALESANDROan 71 y25. year old female who is a primary patient of MorgGwenlyn PerkingP.  The CCM team was consulted for assistance with disease management and care coordination needs.   ? ?Engaged with patient by telephone for follow up visit in response to provider referral for pharmacy case management and/or care coordination services.  ? ?Consent to Services:  ?The patient was given information about Chronic Care Management services, agreed to services, and gave verbal consent  prior to initiation of services.  Please see initial visit note for detailed documentation.  ? ?Patient Care Team: ?MorgGwenlyn PerkingP as PCP - General (Family Medicine) ?Rourk, RobeCristopher Estimable (Gastroenterology) ?HochMinus Breeding (Cardiology) ?HudyIlean China as [employer] trader, [facility] as Referring Physician (Optometry) ?PruiLavera GuiseH [facility] ([facility]) ?BeavSandford Craze as Referring Physician (Dermatology) ? ?Objective: ? ?Lab Results  ?Component Value Date  ? CREATININE 1.27 (H) 01/29/2022  ? CREATININE 1.26 (H) 01/24/2022  ? CREATININE 0.86 12/27/2021  ? ? ?Lab Results  ?Component Value Date  ? HGBA1C 8.0 (H) 11/02/2021  ? ?Last diabetic Eye exam:  ?Lab Results  ?Component Value Date/Time  ? HMDIABEYEEXA No Retinopathy 11/29/2015 12:00 AM  ?  ?Last diabetic Foot exam: No results found for: HMDIABFOOTEX  ? ?   ?Component Value Date/Time  ? CHOL 130 08/09/2021 0959  ? CHOL 107 02/05/2013 1002  ? TRIG 93 08/09/2021 0959  ? TRIG 149 11/30/2015 0947  ? TRIG 143 02/05/2013 1002  ? HDL 39 (L) 08/09/2021 0959  ? HDL 35 (L) 11/30/2015 0947  ? HDL 35 (L) 02/05/2013 1002  ? CHOLHDL 3.3 08/09/2021 0959  ? CHOLHDL 3.9 09/22/2017 0518  ? VLDL 26 09/22/2017 0518  ? LDLCCulver11/16/2022 0959  ? LDLCALC 195 (H) 04/26/2014 1029  ? LDLCALC 43 02/05/2013 1002  ? LDLDIRECT 93 04/06/2015 1038  ? ? ? ?  Latest Ref Rng & Units 12/27/2021  ? 12:25 PM 12/24/2021  ?  6:10 AM 12/11/2021  ?  8:54 AM  ?Hepatic Function  ?Total Protein 6.0 - 8.5 g/dL 6.6   7.2   6.7    ?Albumin 3.7 - 4.7 g/dL 3.9   3.4   4.2    ?  AST 0 - 40 IU/L 12   18   17     ?ALT 0 - 32 IU/L 9   15   8     ?Alk Phosphatase 44 - 121 IU/L 69   52   54    ?Total Bilirubin 0.0 - 1.2 mg/dL 0.3   0.7   0.4    ? ? ?Lab Results  ?Component Value Date/Time  ? TSH 2.110 12/11/2021 08:54 AM  ? TSH 3.372 11/15/2020 08:14 PM  ? TSH 1.950 04/23/2018 09:43 AM  ? FREET4 1.16 04/17/2007 06:30 AM  ? ? ? ?  Latest Ref Rng & Units 12/27/2021  ? 12:25 PM  12/24/2021  ?  6:10 AM 12/11/2021  ?  8:54 AM  ?CBC  ?WBC 3.4 - 10.8 x10E3/uL 8.7   8.2   7.3    ?Hemoglobin 11.1 - 15.9 g/dL 12.1   11.8   12.1    ?Hematocrit 34.0 - 46.6 % 37.1   35.3   35.5    ?Platelets 150 - 450 x10E3/uL 190   167   178    ? ? ?Lab Results  ?Component Value Date/Time  ? VD25OH 90.28 11/15/2020 08:14 PM  ? VD25OH 45.4 02/20/2019 11:52 AM  ? ? ?Clinical ASCVD: No  ?The 10-year ASCVD risk score (Arnett DK, et al., 2019) is: 36.3% ?  Values used to calculate the score: ?    Age: 79 years ?    Sex: Female ?    Is Non-Hispanic African American: No ?    Diabetic: Yes ?    Tobacco smoker: No ?    Systolic Blood Pressure: 003 mmHg ?    Is BP treated: Yes ?    HDL Cholesterol: 39 mg/dL ?    Total Cholesterol: 130 mg/dL   ? ?Other: (CHADS2VASc if Afib, PHQ9 if depression, MMRC or CAT for COPD, ACT, DEXA) ? ?Social History  ? ?Tobacco Use  ?Smoking Status Never  ?Smokeless Tobacco Never  ?Tobacco Comments  ? spouse, 60 years - husband has quit 01/2011  ? ?BP Readings from Last 3 Encounters:  ?01/24/22 (!) 146/75  ?01/15/22 (!) 160/92  ?01/08/22 (!) 155/86  ? ?Pulse Readings from Last 3 Encounters:  ?01/24/22 80  ?01/15/22 80  ?01/08/22 87  ? ?Wt Readings from Last 3 Encounters:  ?01/24/22 156 lb 3.2 oz (70.9 kg)  ?01/22/22 157 lb (71.2 kg)  ?01/15/22 157 lb 6 oz (71.4 kg)  ? ? ?Assessment: Review of patient past medical history, allergies, medications, health status, including review of consultants reports, laboratory and other test data, was performed as part of comprehensive evaluation and provision of chronic care management services.  ? ?SDOH:  (Social Determinants of Health) assessments and interventions performed:  ? ? ?CCM Care Plan ? ?Allergies  ?Allergen Reactions  ? Iohexol   ?   Desc: pt had syncopal episode with nausea post IV CM late 1990's,  pt has had prednisone prep with heart caths x 2 without problem  kdean 04/16/07, Onset Date: [id number] ?  ? Ticlid [Ticlopidine Hcl] Nausea And Vomiting  ?  Jardiance [Empagliflozin] Other (See Comments)  ?  Recurrent UTIs  ? Metformin And Related Diarrhea  ? Codeine Nausea And Vomiting and Palpitations  ? ? ?Medications Reviewed Today   ? ? Reviewed by Lavera Guise, [facility] (Pharmacist) on 01/30/22 at 1127  Med List Status: <None>  ? ?Medication Order Taking? Sig Documenting Provider Last Dose Status Informant  ?amLODipine (NORVASC) 10 MG tablet [id number]  No Take 1 tablet (10 mg total) by mouth daily. Roxan Hockey, MD Taking Active Self  ?aspirin 325 MG tablet [id number] No Take 325 mg by mouth daily. [provider] Taking Active Self  ?atenolol (TENORMIN) 50 MG tablet [id number] No Take 1 tablet (50 mg total) by mouth 2 (two) times daily. Gwenlyn Perking, FNP Taking Active   ?calcium carbonate (OS-CAL) 600 MG TABS tablet [id number] No Take 1 tablet (600 mg total) by mouth 2 (two) times daily with a meal. Gwenlyn Perking, FNP Taking Active Self  ?citalopram (CELEXA) 40 MG tablet [id number] No Take 40 mg by mouth daily. [provider] Taking Active   ?Continuous Blood Gluc Receiver (FREESTYLE LIBRE 2 READER) DEVI [id number] No USE TO TEST BLOOD SUGAR 6 TIMES DAILY. Dx:E11.65 Gwenlyn Perking, FNP Taking Active   ?Continuous Blood Gluc Sensor (FREESTYLE LIBRE 2 SENSOR) MISC [id number] No USE TO TEST BLOOD SUGAR 6 TIMES DAILY. Dx:E11.65 Gwenlyn Perking, FNP Taking Active   ?furosemide (LASIX) 20 MG tablet [id number] No TAKE ONE (1) TABLET EACH DAY Rakes, Connye Burkitt, FNP Taking Active   ?hydrOXYzine (ATARAX/VISTARIL) 10 MG tablet [id number] No Take 1 tablet (10 mg total) by mouth 3 (three) times daily as needed. Gwenlyn Perking, FNP Taking Active   ?insulin degludec (TRESIBA FLEXTOUCH) 100 UNIT/ML FlexTouch Pen [id number] No Inject 60 Units into the skin daily. [provider] Taking Active   ?         ?Med Note Blanca Friend, Zeven Kocak D   Tue Jan 02, 2022  9:27 PM) Via novo nordisk patient assistance program  ?isosorbide mononitrate (IMDUR) 60 MG 24 hr  tablet [id number] No TAKE ONE (1) TABLET EACH DAY Gwenlyn Perking, FNP Taking Active   ?levocetirizine (XYZAL) 5 MG tablet [id number]  Take 1 tablet (5 mg total) by mouth every evening. Hendricks Limes

## 2022-02-02 ENCOUNTER — Ambulatory Visit (INDEPENDENT_AMBULATORY_CARE_PROVIDER_SITE_OTHER): Payer: PPO | Admitting: Family Medicine

## 2022-02-02 ENCOUNTER — Encounter: Payer: Self-pay | Admitting: Family Medicine

## 2022-02-02 DIAGNOSIS — N390 Urinary tract infection, site not specified: Secondary | ICD-10-CM | POA: Diagnosis not present

## 2022-02-02 MED ORDER — CEPHALEXIN 500 MG PO CAPS: 500.0000 mg | ORAL_CAPSULE | Freq: Two times a day (BID) | ORAL | 0 refills | Status: AC

## 2022-02-02 NOTE — Progress Notes (Signed)
Telephone visit ? ?Subjective: ?CC: UTI ?PCP: Gwenlyn Perking, FNP ?XNA:TFTDD D Dyck is a 71 y.o. female calls for telephone consult today. Patient provides verbal consent for consult held via phone. ? ?Due to COVID-19 pandemic this visit was conducted virtually. This visit type was conducted due to national recommendations for restrictions regarding the COVID-19 Pandemic (e.g. social distancing, sheltering in place) in an effort to limit this patient's exposure and mitigate transmission in our community. All issues noted in this document were discussed and addressed.  A physical exam was not performed with this format.  ? ?Location of patient: 3.5 hours away at an emergency ?Location of provider: [facility] ?Others present for call: many but asks to proceed with call ? ?1. UTI ?Patient reports a strong odor to urine.  She reports vaginal itching. She reports urine frequency that has been ongoing for 2 days.  No hematuria. She reports that she was getting them on a monthly basis.  This is the first UTI in 3 months.  However after further inspection she had a urine done in March which grew Klebsiella that was resistant to ampicillin.  She has known CKD 3A.  She has not been yet referred to a urologist but would be willing to go to 1.  She denies any nausea, vomiting, fevers, new onset back pain. ? ?ROS: Per HPI ? ?Allergies  ?Allergen Reactions  ? Iohexol   ?   Desc: pt had syncopal episode with nausea post IV CM late 1990's,  pt has had prednisone prep with heart caths x 2 without problem  kdean 04/16/07, Onset Date: [id number] ?  ? Ticlid [Ticlopidine Hcl] Nausea And Vomiting  ? Jardiance [Empagliflozin] Other (See Comments)  ?  Recurrent UTIs  ? Metformin And Related Diarrhea  ? Codeine Nausea And Vomiting and Palpitations  ? ?Past Medical History:  ?Diagnosis Date  ? Anxiety   ? CAD (coronary artery disease)   ? DES to circumflex 02/2007, BMS to LAD and PTCA diagonal 03/2007  ? Carotid artery plaque   ? Mild  ? Cataract    ? Depression   ? Diverticulitis, colon   ? Elevated d-dimer 01/08/2014  ? Essential hypertension, benign   ? GERD (gastroesophageal reflux disease)   ? H/O hiatal hernia   ? HLD (hyperlipidemia)   ? IDDM (insulin dependent diabetes mellitus)   ? Migraine   ? "used to have them really bad; don't have them anymore" (01/07/2014)  ? MS (multiple sclerosis) ([place])   ? Not confirmed  ? PAT (paroxysmal atrial tachycardia) ([place])   ? Prolapse of uterus   ? PVD (peripheral vascular disease) ([place])   ? TIA (transient ischemic attack) 1980's  ? ? ?Current Outpatient Medications:  ?  amLODipine (NORVASC) 10 MG tablet, Take 1 tablet (10 mg total) by mouth daily., Disp: 30 tablet, Rfl: 5 ?  aspirin 325 MG tablet, Take 325 mg by mouth daily., Disp: , Rfl:  ?  atenolol (TENORMIN) 50 MG tablet, Take 1 tablet (50 mg total) by mouth 2 (two) times daily., Disp: 180 tablet, Rfl: 3 ?  calcium carbonate (OS-CAL) 600 MG TABS tablet, Take 1 tablet (600 mg total) by mouth 2 (two) times daily with a meal., Disp: 180 tablet, Rfl: 3 ?  citalopram (CELEXA) 40 MG tablet, Take 40 mg by mouth daily., Disp: , Rfl:  ?  Continuous Blood Gluc Receiver (FREESTYLE LIBRE 2 READER) DEVI, USE TO TEST BLOOD SUGAR 6 TIMES DAILY. Dx:E11.65, Disp: 1 each, Rfl: 0 ?  Continuous Blood Gluc Sensor (FREESTYLE LIBRE 2 SENSOR) MISC, USE TO TEST BLOOD SUGAR 6 TIMES DAILY. Dx:E11.65, Disp: 2 each, Rfl: 11 ?  furosemide (LASIX) 20 MG tablet, TAKE ONE (1) TABLET EACH DAY, Disp: 90 tablet, Rfl: 0 ?  hydrOXYzine (ATARAX/VISTARIL) 10 MG tablet, Take 1 tablet (10 mg total) by mouth 3 (three) times daily as needed., Disp: 30 tablet, Rfl: 0 ?  insulin degludec (TRESIBA FLEXTOUCH) 100 UNIT/ML FlexTouch Pen, Inject 60 Units into the skin daily., Disp: , Rfl:  ?  isosorbide mononitrate (IMDUR) 60 MG 24 hr tablet, TAKE ONE (1) TABLET EACH DAY, Disp: 30 tablet, Rfl: 5 ?  levocetirizine (XYZAL) 5 MG tablet, Take 1 tablet (5 mg total) by mouth every evening., Disp: 30 tablet, Rfl: 2 ?   meclizine (ANTIVERT) 25 MG tablet, Take 1 tablet (25 mg total) by mouth 3 (three) times daily as needed for dizziness., Disp: 90 tablet, Rfl: 3 ?  meloxicam (MOBIC) 15 MG tablet, Take 1 tablet (15 mg total) by mouth daily., Disp: 30 tablet, Rfl: 0 ?  nitroGLYCERIN (NITROSTAT) 0.4 MG SL tablet, DISSOLVE 1 TABLET UNDER TONGUE FOR CHESTPAIN.MAY REPEAT EVERY 5 MINUTES FOR 3 DOSES.IF NO RELIEF CALL 911 OR GO TO ER, Disp: 25 tablet, Rfl: 6 ?  NOVOLOG FLEXPEN 100 UNIT/ML FlexPen, INJECT 35-40 UNITS SQ 3 TIMES DAILY WITH MEALS (Patient taking differently: 20-30 Units 3 (three) times daily with meals. Takes 8-30 units TID with meals depending on glucose), Disp: 30 mL, Rfl: 3 ?  ondansetron (ZOFRAN) 8 MG tablet, Take 0.5 tablets (4 mg total) by mouth every 6 (six) hours as needed., Disp: 60 tablet, Rfl: 0 ?  potassium chloride (KLOR-CON M) 10 MEQ tablet, Take 1 tablet (10 mEq total) by mouth daily., Disp: 30 tablet, Rfl: 0 ?  prednisoLONE 5 MG TABS tablet, Take by mouth., Disp: , Rfl:  ?  rOPINIRole (REQUIP) 0.5 MG tablet, Take 2 tablets (1 mg total) by mouth at bedtime., Disp: 180 tablet, Rfl: 2 ?  rosuvastatin (CRESTOR) 40 MG tablet, TAKE ONE (1) TABLET EACH DAY (Patient taking differently: Take 40 mg by mouth daily.), Disp: 90 tablet, Rfl: 3 ?  Semaglutide, 1 MG/DOSE, (OZEMPIC, 1 MG/DOSE,) 2 MG/1.5ML SOPN, Inject 1 mg into the skin once a week., Disp: , Rfl:  ?  sertraline (ZOLOFT) 50 MG tablet, Take 1 tablet (50 mg total) by mouth daily., Disp: 30 tablet, Rfl: 3 ?  traZODone (DESYREL) 100 MG tablet, Take 1 tablet (100 mg total) by mouth at bedtime as needed for sleep., Disp: 90 tablet, Rfl: 1 ?  triamcinolone ointment (KENALOG) 0.5 %, Apply 1 application. topically 2 (two) times daily., Disp: 30 g, Rfl: 0 ?  vitamin B-12 (CYANOCOBALAMIN) 1000 MCG tablet, Take 1,000 mcg by mouth daily., Disp: , Rfl:  ?  Vitamin D, Ergocalciferol, (DRISDOL) 1.25 MG (50000 UNIT) CAPS capsule, TAKE 1 CAPSULE EVERY 7 DAYS, Disp: 12 capsule,  Rfl: 3 ? ?Assessment/ Plan: ?71 y.o. female  ? ?Recurrent UTI - Plan: cephALEXin (KEFLEX) 500 MG capsule, Ambulatory referral to Urology, CANCELED: Urinalysis, Routine w reflex microscopic, CANCELED: Urine Culture ? ?Very concerned with recurrence of urinary tract infection and/or symptoms.  I reviewed her chart which showed intermittently positive urine cultures.  Her most recent one from March grew Klebsiella that was resistant to ampicillin.  I am going to place her on Keflex 500 mg twice daily for 1 week since it was sensitive to cephalosporins.  We discussed red flag signs and symptoms warranting evaluation  emergently.  Again we discussed the limited nature of quality of care given inability to collect a urine sample.  I have referred to urology given recurrence of her issue. ? ?Start time: 1:31p ?End time: 1:36p ? ?Total time spent on patient care (including telephone call/ virtual visit): 5 minutes ? ?Janora Norlander, DO ?[place] ?[phone] ? ?

## 2022-02-05 ENCOUNTER — Other Ambulatory Visit: Payer: PPO

## 2022-02-05 DIAGNOSIS — N390 Urinary tract infection, site not specified: Secondary | ICD-10-CM | POA: Diagnosis not present

## 2022-02-05 LAB — URINALYSIS, ROUTINE W REFLEX MICROSCOPIC
Bilirubin, UA: NEGATIVE
Glucose, UA: NEGATIVE
Ketones, UA: NEGATIVE
Nitrite, UA: NEGATIVE
Protein,UA: NEGATIVE
Specific Gravity, UA: 1.01 (ref 1.005–1.030)
Urobilinogen, Ur: 0.2 mg/dL (ref 0.2–1.0)
pH, UA: 5.5 (ref 5.0–7.5)

## 2022-02-05 LAB — MICROSCOPIC EXAMINATION
Epithelial Cells (non renal): >10 /hpf — AB (ref 0–10)
RBC, Urine: NONE SEEN /hpf (ref 0–2)

## 2022-02-05 NOTE — Addendum Note (Signed)
Addended by: Everlean Cherry on: 02/05/2022 10:21 AM ? ? Modules accepted: Orders ? ?

## 2022-02-05 NOTE — Addendum Note (Signed)
Addended by: Everlean Cherry on: 02/05/2022 10:27 AM ? ? Modules accepted: Orders ? ?

## 2022-02-06 LAB — URINE CULTURE: Organism ID, Bacteria: NO GROWTH

## 2022-02-09 ENCOUNTER — Encounter: Payer: Self-pay | Admitting: *Deleted

## 2022-02-14 ENCOUNTER — Telehealth: Payer: Self-pay | Admitting: Pharmacist

## 2022-02-14 NOTE — Telephone Encounter (Signed)
Please let patient know: Ozempic, tresiba and pen needles are in fridge and up front for pick up  Thank you! Almyra Free

## 2022-02-14 NOTE — Telephone Encounter (Signed)
Patient notified and verbalized understanding. 

## 2022-02-20 ENCOUNTER — Encounter: Payer: Self-pay | Admitting: Urology

## 2022-02-20 ENCOUNTER — Ambulatory Visit (INDEPENDENT_AMBULATORY_CARE_PROVIDER_SITE_OTHER): Payer: PPO | Admitting: Urology

## 2022-02-20 VITALS — BP 150/74 | HR 93

## 2022-02-20 DIAGNOSIS — R339 Retention of urine, unspecified: Secondary | ICD-10-CM

## 2022-02-20 DIAGNOSIS — Z87440 Personal history of urinary (tract) infections: Secondary | ICD-10-CM | POA: Diagnosis not present

## 2022-02-20 DIAGNOSIS — R8290 Unspecified abnormal findings in urine: Secondary | ICD-10-CM

## 2022-02-20 DIAGNOSIS — N390 Urinary tract infection, site not specified: Secondary | ICD-10-CM | POA: Diagnosis not present

## 2022-02-20 LAB — URINALYSIS, ROUTINE W REFLEX MICROSCOPIC
Bilirubin, UA: NEGATIVE
Ketones, UA: NEGATIVE
Nitrite, UA: POSITIVE — AB
Protein,UA: NEGATIVE
Specific Gravity, UA: 1.015 (ref 1.005–1.030)
Urobilinogen, Ur: 0.2 mg/dL (ref 0.2–1.0)
pH, UA: 5.5 (ref 5.0–7.5)

## 2022-02-20 LAB — MICROSCOPIC EXAMINATION
Renal Epithel, UA: NONE SEEN /hpf
WBC, UA: >30 /hpf — AB (ref 0–5)

## 2022-02-20 LAB — BLADDER SCAN AMB NON-IMAGING: Scan Result: 120

## 2022-02-20 MED ORDER — FLUCONAZOLE 100 MG PO TABS: 100.0000 mg | ORAL_TABLET | Freq: Every day | ORAL | 0 refills | Status: AC

## 2022-02-20 NOTE — Progress Notes (Signed)
Assessment: 1. Recurrent UTI   2. Incomplete emptying of bladder   3. Abnormal urine findings     Plan: I reviewed the patient's records including all available urine cultures. Methods to reduce the risk of UTIs discussed with the patient including increased fluid intake, timed and double voiding, management of constipation, daily cranberry supplement, and daily probiotic.  I also discussed the potential role of a daily prophylactic antibiotic in cases of recurrent symptomatic UTIs. Urine culture sent today.  We will contact her with results and recommendations for treatment. We will treat with fluconazole 100 mg daily x3 days. Recommend management of constipation with MiraLAX. Return to office in 1 month. Recommend nephrology referral for chronic kidney disease.  Chief Complaint:  Chief Complaint  Patient presents with   Recurrent UTI    History of Present Illness:  [name] is a 71 y.o. year old female who is seen in consultation from Gwenlyn Perking, FNP for evaluation of frequent UTIs.  She reports frequent UTIs for approximately 2 years.  Typical symptoms include frequency, urgency, dysuria, and low back pain.  She does report subjective fever and chills with the UTIs.  No gross hematuria.  Her symptoms typically resolve with antibiotic therapy but then returned shortly after completion of antibiotics.  She was last treated for UTI in early May 2023.  She is not having any UTI symptoms today.  She does have symptoms of a vaginal yeast infection with itching and burning in the vaginal area.  No imaging studies have been performed.  Urine culture results: 4/22 >100 K Serratia 5/22 >100 K Serratia 6/22 >100 K Serratia 11/22 <10K mixed flora 3/23 >100 K Klebsiella 4/23 no growth 5/23 no growth -patient was on antibiotics at the time of the sample.  She does report problems with constipation having a bowel movement every week.  She is not on any therapy at the present  time.   Past Medical History:  Past Medical History:  Diagnosis Date   Anxiety    CAD (coronary artery disease)    DES to circumflex 02/2007, BMS to LAD and PTCA diagonal 03/2007   Carotid artery plaque    Mild   Cataract    Depression    Diverticulitis, colon    Elevated d-dimer 01/08/2014   Essential hypertension, benign    GERD (gastroesophageal reflux disease)    H/O hiatal hernia    HLD (hyperlipidemia)    IDDM (insulin dependent diabetes mellitus)    Migraine    "used to have them really bad; don't have them anymore" (01/07/2014)   MS (multiple sclerosis) ([facility])    Not confirmed   PAT (paroxysmal atrial tachycardia) ([facility])    Prolapse of uterus    PVD (peripheral vascular disease) ([facility])    TIA (transient ischemic attack) 1980's    Past Surgical History:  Past Surgical History:  Procedure Laterality Date   ABDOMINAL HYSTERECTOMY  1986   ovaries remain - prolaspe uterus    APPENDECTOMY  ~ 1970   BREAST BIOPSY Right 1980's   BREAST LUMPECTOMY Right 1980's   Dr. Charlynne Pander    CARDIAC CATHETERIZATION  01/07/2014   CHOLECYSTECTOMY  ?1987   COLONOSCOPY  2002   Dr. Anwar--> Severe diverticular changes in the region of the sigmoid and descending colon with scattered diverticular changes throughout the rest of the colon. No polyps, ulcerations. Despite numerous manipulations, the tip of the scope could not be tipped into the cecal area.   COLONOSCOPY  01/10/2012   Procedure: COLONOSCOPY;  Surgeon: Daneil Dolin, MD;  Location: [facility];  Service: Endoscopy;  Laterality: N/A;  1:55   CORONARY ANGIOPLASTY WITH STENT PLACEMENT  ~ 1997 X 2   "2 + 1"   EYE SURGERY Bilateral 2014   cataract   INTRAVASCULAR PRESSURE WIRE/FFR STUDY N/A 03/08/2017   Procedure: Intravascular Pressure Wire/FFR Study;  Surgeon: Nelva Bush, MD;  Location: [place] [facility];  Service: Cardiovascular;  Laterality: N/A;   LEFT HEART CATH AND CORONARY ANGIOGRAPHY N/A 03/08/2017   Procedure: Left Heart  Cath and Coronary Angiography;  Surgeon: Nelva Bush, MD;  Location: [place] [facility];  Service: Cardiovascular;  Laterality: N/A;   LEFT HEART CATHETERIZATION WITH CORONARY ANGIOGRAM N/A 01/07/2014   Procedure: LEFT HEART CATHETERIZATION WITH CORONARY ANGIOGRAM;  Surgeon: Larey Dresser, MD;  Location: [facility] CATH LAB;  Service: Cardiovascular;  Laterality: N/A;     Allergies:  Allergies  Allergen Reactions   Iohexol      Desc: pt had syncopal episode with nausea post IV CM late 1990's,  pt has had prednisone prep with heart caths x 2 without problem  kdean 04/16/07, Onset Date: [id number]    Ticlid [Ticlopidine Hcl] Nausea And Vomiting   Jardiance [Empagliflozin] Other (See Comments)    Recurrent UTIs   Metformin And Related Diarrhea   Codeine Nausea And Vomiting and Palpitations    Family History:  Family History  Problem Relation Age of Onset   Heart attack Mother 20   Diabetes Mother    Hypertension Mother    Heart attack Father 35   Heart attack Brother 52       x 6   Heart disease Brother    Diabetes Brother    Colon cancer Paternal Aunt        97s, died with brain anuerysm   Crohn's disease Cousin        paternal   Diabetes Sister    GER disease Daughter    Cervical cancer Daughter    Diabetes Daughter     Social History:  Social History   Tobacco Use   Smoking status: Never   Smokeless tobacco: Never   Tobacco comments:    spouse, 45 years - husband has quit 01/2011  Vaping Use   Vaping Use: Never used  Substance Use Topics   Alcohol use: No   Drug use: No    Review of symptoms:  Constitutional:  Negative for unexplained weight loss, night sweats, fever, chills ENT:  Negative for nose bleeds, sinus pain, painful swallowing CV:  Negative for chest pain, shortness of breath, exercise intolerance, palpitations, loss of consciousness Resp:  Negative for cough, wheezing, shortness of breath GI:  Negative for nausea, vomiting, diarrhea, bloody stools GU:   Positives noted in HPI; otherwise negative for gross hematuria, urinary incontinence Neuro:  Negative for seizures, poor balance, limb weakness, slurred speech Psych:  Negative for lack of energy, depression, anxiety Endocrine:  Negative for polydipsia, polyuria, symptoms of hypoglycemia (dizziness, hunger, sweating) Hematologic:  Negative for anemia, purpura, petechia, prolonged or excessive bleeding, use of anticoagulants  Allergic:  Negative for difficulty breathing or choking as a result of exposure to anything; no shellfish allergy; no allergic response (rash/itch) to materials, foods  Physical exam: BP (!) 150/74   Pulse 93  GENERAL APPEARANCE:  Well appearing, well developed, well nourished, NAD HEENT: Atraumatic, Normocephalic, oropharynx clear. NECK: Supple without lymphadenopathy or thyromegaly. LUNGS: Clear to auscultation bilaterally. HEART: Regular Rate  and Rhythm without murmurs, gallops, or rubs. ABDOMEN: Soft, non-tender, No Masses. EXTREMITIES: Moves all extremities well.  Without clubbing, cyanosis, or edema. NEUROLOGIC:  Alert and oriented x 3, normal gait, CN II-XII grossly intact.  MENTAL STATUS:  Appropriate. BACK:  Non-tender to palpation.  No CVAT SKIN:  Warm, dry and intact.    Results: U/A: >30 WBC, 3-10 RBC, many bacteria, nitrite positive   Results for orders placed or performed in visit on 02/20/22 (from the past 24 hour(s))  BLADDER SCAN AMB NON-IMAGING   Collection Time: 02/20/22 10:02 AM  Result Value Ref Range   Scan Result 120

## 2022-02-20 NOTE — Progress Notes (Signed)
post void residual=120 

## 2022-02-21 DIAGNOSIS — E1169 Type 2 diabetes mellitus with other specified complication: Secondary | ICD-10-CM

## 2022-02-21 DIAGNOSIS — E785 Hyperlipidemia, unspecified: Secondary | ICD-10-CM

## 2022-02-23 ENCOUNTER — Other Ambulatory Visit: Payer: Self-pay | Admitting: Family Medicine

## 2022-02-23 LAB — URINE CULTURE

## 2022-02-23 MED ORDER — CEFDINIR 300 MG PO CAPS: 300.0000 mg | ORAL_CAPSULE | Freq: Two times a day (BID) | ORAL | 0 refills | Status: AC

## 2022-02-23 NOTE — Addendum Note (Signed)
Addended by: Primus Bravo on: 02/23/2022 08:55 AM   Modules accepted: Orders

## 2022-03-12 ENCOUNTER — Encounter: Payer: Self-pay | Admitting: Family Medicine

## 2022-03-12 ENCOUNTER — Ambulatory Visit (INDEPENDENT_AMBULATORY_CARE_PROVIDER_SITE_OTHER): Payer: PPO | Admitting: Family Medicine

## 2022-03-12 VITALS — BP 125/71 | HR 78 | Temp 98.1°F | Ht 66.0 in | Wt 161.5 lb

## 2022-03-12 DIAGNOSIS — E1159 Type 2 diabetes mellitus with other circulatory complications: Secondary | ICD-10-CM

## 2022-03-12 DIAGNOSIS — I25119 Atherosclerotic heart disease of native coronary artery with unspecified angina pectoris: Secondary | ICD-10-CM

## 2022-03-12 DIAGNOSIS — F339 Major depressive disorder, recurrent, unspecified: Secondary | ICD-10-CM

## 2022-03-12 DIAGNOSIS — E1165 Type 2 diabetes mellitus with hyperglycemia: Secondary | ICD-10-CM

## 2022-03-12 DIAGNOSIS — Z23 Encounter for immunization: Secondary | ICD-10-CM | POA: Diagnosis not present

## 2022-03-12 DIAGNOSIS — I509 Heart failure, unspecified: Secondary | ICD-10-CM

## 2022-03-12 DIAGNOSIS — I152 Hypertension secondary to endocrine disorders: Secondary | ICD-10-CM

## 2022-03-12 DIAGNOSIS — I10 Essential (primary) hypertension: Secondary | ICD-10-CM | POA: Diagnosis not present

## 2022-03-12 DIAGNOSIS — F411 Generalized anxiety disorder: Secondary | ICD-10-CM | POA: Diagnosis not present

## 2022-03-12 DIAGNOSIS — E1169 Type 2 diabetes mellitus with other specified complication: Secondary | ICD-10-CM | POA: Diagnosis not present

## 2022-03-12 DIAGNOSIS — E785 Hyperlipidemia, unspecified: Secondary | ICD-10-CM

## 2022-03-12 LAB — BAYER DCA HB A1C WAIVED: HB A1C (BAYER DCA - WAIVED): 7.1 % — ABNORMAL HIGH (ref 4.8–5.6)

## 2022-03-12 NOTE — Addendum Note (Signed)
Addended by: Milas Hock on: 03/12/2022 10:33 AM   Modules accepted: Orders

## 2022-03-12 NOTE — Progress Notes (Signed)
Established Patient Office Visit  Subjective:  Patient ID: [name], female    DOB: March 07, 1951  Age: 71 y.o. MRN: [id number]  CC:  Chief Complaint  Patient presents with   Medical Management of Chronic Issues   Diabetes   Hyperlipidemia   Hypertension    HPI [name] presents for chronic follow up.   1. DM Patient denies foot ulcerations, paresthesia of the feet, vomiting and weight loss.   Current diabetic medications include tresiba 60 units daily, novolog 26 units with meals TID, ozempic 1 mg Compliant with meds - Yes   Current monitoring regimen: home blood tests - CGM Home blood sugar records: averages 150-200 over last 30 days. Any episodes of hypoglycemia? no   Eye exam current (within one year): yes Weight trend: stable   Is She on ACE inhibitor or angiotensin II receptor blocker?  No Is She on statin? Yes crestor   2. HTN Complaint with meds - Yes Current Medications - norvasc, atenolol, lasix, imdur Checking BP at home: yes, reports 130s/80s Pertinent ROS:  Headache - No Fatigue - No Visual Disturbances - No Chest pain - No Dyspnea - No Palpitations - No LE edema - No   3. Depression, anxiety She is taking Celexa daily. She has been worried about family members who have some health issues going on. Otherwise she is feeling good.      03/12/2022    7:58 AM 01/24/2022    8:22 AM 01/22/2022    3:45 PM  Depression screen PHQ 2/9  Decreased Interest _0 Down, Depressed, Hopeless 1 0 1  PHQ - 2 Score _1 Altered sleeping _2 Tired, decreased energy _3 Change in appetite _4 Feeling bad or failure about yourself  0 0 0  Trouble concentrating _5 Moving slowly or fidgety/restless 0 3 0  Suicidal thoughts 0 0 0  PHQ-9 Score _6 Difficult doing work/chores Somewhat difficult Very difficult Very difficult      03/12/2022    7:59 AM 01/24/2022    8:22 AM 01/15/2022    8:07 AM 01/08/2022   10:59 AM  GAD 7 : Generalized  Anxiety Score  Nervous, Anxious, on Edge _7 Control/stop worrying _8 Worry too much - different things 3 0 3 2  Trouble relaxing _9 Restless _10 Easily annoyed or irritable _11 Afraid - awful might happen 0 0 0 0  Total GAD 7 Score _12 Anxiety Difficulty Somewhat difficult Very difficult Very difficult Extremely difficult     Past Medical History:  Diagnosis Date   Anxiety    CAD (coronary artery disease)    DES to circumflex 02/2007, BMS to LAD and PTCA diagonal 03/2007   Carotid artery plaque    Mild   Cataract    Depression    Diverticulitis, colon    Elevated d-dimer 01/08/2014   Essential hypertension, benign    GERD (gastroesophageal reflux disease)    H/O hiatal hernia    HLD (hyperlipidemia)    IDDM (insulin dependent diabetes mellitus)    Migraine    "used to have them really bad; don't have them anymore" (01/07/2014)   MS (multiple sclerosis) ([place])    Not confirmed   PAT (paroxysmal atrial tachycardia) ([place])  Prolapse of uterus    PVD (peripheral vascular disease) (HCC)    TIA (transient ischemic attack) 1980's    Past Surgical History:  Procedure Laterality Date   ABDOMINAL HYSTERECTOMY  1986   ovaries remain - prolaspe uterus    APPENDECTOMY  ~ [place] Right 1980's   BREAST LUMPECTOMY Right 1980's   Dr. Charlynne Pander    CARDIAC CATHETERIZATION  01/07/2014   CHOLECYSTECTOMY  ?1987   COLONOSCOPY  2002   Dr. Anwar--> Severe diverticular changes in the region of the sigmoid and descending colon with scattered diverticular changes throughout the rest of the colon. No polyps, ulcerations. Despite numerous manipulations, the tip of the scope could not be tipped into the cecal area.   COLONOSCOPY  01/10/2012   Procedure: COLONOSCOPY;  Surgeon: Daneil Dolin, MD;  Location: [facility];  Service: Endoscopy;  Laterality: N/A;  1:55   CORONARY ANGIOPLASTY WITH STENT PLACEMENT  ~ 1997 X 2   "2 + 1"   EYE SURGERY  Bilateral 2014   cataract   INTRAVASCULAR PRESSURE WIRE/FFR STUDY N/A 03/08/2017   Procedure: Intravascular Pressure Wire/FFR Study;  Surgeon: Nelva Bush, MD;  Location: [facility];  Service: Cardiovascular;  Laterality: N/A;   LEFT HEART CATH AND CORONARY ANGIOGRAPHY N/A 03/08/2017   Procedure: Left Heart Cath and Coronary Angiography;  Surgeon: Nelva Bush, MD;  Location: [facility];  Service: Cardiovascular;  Laterality: N/A;   LEFT HEART CATHETERIZATION WITH CORONARY ANGIOGRAM N/A 01/07/2014   Procedure: LEFT HEART CATHETERIZATION WITH CORONARY ANGIOGRAM;  Surgeon: Larey Dresser, MD;  Location: [facility] CATH LAB;  Service: Cardiovascular;  Laterality: N/A;    Family History  Problem Relation Age of Onset   Heart attack Mother 71   Diabetes Mother    Hypertension Mother    Heart attack Father 2   Heart attack Brother 14       x 6   Heart disease Brother    Diabetes Brother    Colon cancer Paternal Aunt        65s, died with brain anuerysm   Crohn's disease Cousin        paternal   Diabetes Sister    GER disease Daughter    Cervical cancer Daughter    Diabetes Daughter     Social History   Socioeconomic History   Marital status: Widowed    Spouse name: Not on file   Number of children: 4   Years of education: 17   Highest education level: 11th grade  Occupational History   Occupation: Disability    Employer: DISABLED  Tobacco Use   Smoking status: Never   Smokeless tobacco: Never   Tobacco comments:    spouse, 25 years - husband has quit 01/2011  Vaping Use   Vaping Use: Never used  Substance and Sexual Activity   Alcohol use: No   Drug use: No   Sexual activity: Not Currently  Other Topics Concern   Not on file  Social History Narrative   Lives alone, one level, handicap accessible bathroom, her children all live nearby   Social Determinants of Health   Financial Resource Strain: Low Risk  (01/22/2022)   Overall Financial Resource  Strain (CARDIA)    Difficulty of Paying Living Expenses: Not very hard  Food Insecurity: No Food Insecurity (01/22/2022)   Hunger Vital Sign    Worried About Running Out of Food in the Last Year: Never true  Ran Out of Food in the Last Year: Never true  Transportation Needs: No Transportation Needs (01/22/2022)   PRAPARE - Hydrologist (Medical): No    Lack of Transportation (Non-Medical): No  Physical Activity: Insufficiently Active (01/22/2022)   Exercise Vital Sign    Days of Exercise per Week: 7 days    Minutes of Exercise per Session: 20 min  Stress: No Stress Concern Present (01/22/2022)   [place]    Feeling of Stress : Only a little  Social Connections: Moderately Integrated (01/22/2022)   Social Connection and Isolation Panel [NHANES]    Frequency of Communication with Friends and Family: More than three times a week    Frequency of Social Gatherings with Friends and Family: More than three times a week    Attends Religious Services: More than 4 times per year    Active Member of [employer] or Organizations: Yes    Attends Archivist Meetings: More than 4 times per year    Marital Status: Widowed  Intimate Partner Violence: Not At Risk (01/22/2022)   Humiliation, Afraid, Rape, and Kick questionnaire    Fear of Current or Ex-Partner: No    Emotionally Abused: No    Physically Abused: No    Sexually Abused: No    Outpatient Medications Prior to Visit  Medication Sig Dispense Refill   amLODipine (NORVASC) 10 MG tablet Take 1 tablet (10 mg total) by mouth daily. 30 tablet 5   aspirin 325 MG tablet Take 325 mg by mouth daily.     atenolol (TENORMIN) 50 MG tablet Take 1 tablet (50 mg total) by mouth 2 (two) times daily. 180 tablet 3   calcium carbonate (OS-CAL) 600 MG TABS tablet Take 1 tablet (600 mg total) by mouth 2 (two) times daily with a meal. 180 tablet 3   citalopram (CELEXA)  40 MG tablet Take 40 mg by mouth daily.     Continuous Blood Gluc Receiver (FREESTYLE LIBRE 2 READER) DEVI USE TO TEST BLOOD SUGAR 6 TIMES DAILY. Dx:E11.65 1 each 0   Continuous Blood Gluc Sensor (FREESTYLE LIBRE 2 SENSOR) MISC USE TO TEST BLOOD SUGAR 6 TIMES DAILY 2 each 11   furosemide (LASIX) 20 MG tablet TAKE ONE (1) TABLET EACH DAY 90 tablet 0   hydrOXYzine (ATARAX/VISTARIL) 10 MG tablet Take 1 tablet (10 mg total) by mouth 3 (three) times daily as needed. 30 tablet 0   insulin degludec (TRESIBA FLEXTOUCH) 100 UNIT/ML FlexTouch Pen Inject 60 Units into the skin daily.     isosorbide mononitrate (IMDUR) 60 MG 24 hr tablet TAKE ONE (1) TABLET EACH DAY 30 tablet 5   levocetirizine (XYZAL) 5 MG tablet Take 1 tablet (5 mg total) by mouth every evening. 30 tablet 2   meclizine (ANTIVERT) 25 MG tablet Take 1 tablet (25 mg total) by mouth 3 (three) times daily as needed for dizziness. 90 tablet 3   nitroGLYCERIN (NITROSTAT) 0.4 MG SL tablet DISSOLVE 1 TABLET UNDER TONGUE FOR CHESTPAIN.MAY REPEAT EVERY 5 MINUTES FOR 3 DOSES.IF NO RELIEF CALL 911 OR GO TO ER 25 tablet 6   NOVOLOG FLEXPEN 100 UNIT/ML FlexPen INJECT 35-40 UNITS SQ 3 TIMES DAILY WITH MEALS (Patient taking differently: 20-30 Units 3 (three) times daily with meals. Takes 8-30 units TID with meals depending on glucose) 30 mL 3   ondansetron (ZOFRAN) 8 MG tablet Take 0.5 tablets (4 mg total) by mouth every 6 (six) hours  as needed. 60 tablet 0   rOPINIRole (REQUIP) 0.5 MG tablet Take 2 tablets (1 mg total) by mouth at bedtime. 180 tablet 2   rosuvastatin (CRESTOR) 40 MG tablet TAKE ONE (1) TABLET EACH DAY (Patient taking differently: Take 40 mg by mouth daily.) 90 tablet 3   Semaglutide, 1 MG/DOSE, (OZEMPIC, 1 MG/DOSE,) 2 MG/1.5ML SOPN Inject 1 mg into the skin once a week.     sertraline (ZOLOFT) 50 MG tablet Take 1 tablet (50 mg total) by mouth daily. 30 tablet 3   traZODone (DESYREL) 100 MG tablet Take 1 tablet (100 mg total) by mouth at  bedtime as needed for sleep. 90 tablet 1   triamcinolone ointment (KENALOG) 0.5 % Apply 1 application. topically 2 (two) times daily. 30 g 0   meloxicam (MOBIC) 15 MG tablet Take 1 tablet (15 mg total) by mouth daily. (Patient not taking: Reported on 03/12/2022) 30 tablet 0   potassium chloride (KLOR-CON M) 10 MEQ tablet Take 1 tablet (10 mEq total) by mouth daily. (Patient not taking: Reported on 03/12/2022) 30 tablet 0   vitamin B-12 (CYANOCOBALAMIN) 1000 MCG tablet Take 1,000 mcg by mouth daily. (Patient not taking: Reported on 03/12/2022)     Vitamin D, Ergocalciferol, (DRISDOL) 1.25 MG (50000 UNIT) CAPS capsule TAKE 1 CAPSULE EVERY 7 DAYS (Patient not taking: Reported on 03/12/2022) 12 capsule 3   potassium chloride (KLOR-CON) 10 MEQ tablet Take 10 mEq by mouth daily.     prednisoLONE 5 MG TABS tablet Take by mouth.     No facility-administered medications prior to visit.    Allergies  Allergen Reactions   Iohexol      Desc: pt had syncopal episode with nausea post IV CM late 1990's,  pt has had prednisone prep with heart caths x 2 without problem  kdean 04/16/07, Onset Date: [id number]    Ticlid [Ticlopidine Hcl] Nausea And Vomiting   Jardiance [Empagliflozin] Other (See Comments)    Recurrent UTIs   Metformin And Related Diarrhea   Codeine Nausea And Vomiting and Palpitations    ROS Review of Systems As per HPI.   Objective:    Physical Exam Vitals and nursing note reviewed.  Constitutional:      General: She is not in acute distress.    Appearance: She is not ill-appearing, toxic-appearing or diaphoretic.  HENT:     Head: Normocephalic and atraumatic.  Eyes:     Extraocular Movements: Extraocular movements intact.     Pupils: Pupils are equal, round, and reactive to light.  Neck:     Thyroid: No thyroid mass, thyromegaly or thyroid tenderness.     Vascular: No carotid bruit.  Cardiovascular:     Rate and Rhythm: Normal rate and regular rhythm.     Heart sounds: Normal  heart sounds. No murmur heard. Pulmonary:     Effort: Pulmonary effort is normal. No respiratory distress.     Breath sounds: Normal breath sounds.  Abdominal:     General: Bowel sounds are normal. There is no distension.     Palpations: Abdomen is soft.     Tenderness: There is no abdominal tenderness. There is no right CVA tenderness, left CVA tenderness, guarding or rebound.  Musculoskeletal:     Cervical back: Neck supple. No tenderness.     Right lower leg: No edema.     Left lower leg: No edema.  Skin:    General: Skin is warm.  Neurological:     General: No focal deficit present.  Mental Status: She is alert and oriented to person, place, and time.     Gait: Gait normal.  Psychiatric:        Mood and Affect: Mood normal.        Behavior: Behavior normal.        Thought Content: Thought content normal.        Judgment: Judgment normal.    Diabetic Foot Exam - Simple   Simple Foot Form Diabetic Foot exam was performed with the following findings: Yes 03/12/2022  8:20 AM  Visual Inspection No deformities, no ulcerations, no other skin breakdown bilaterally: Yes Sensation Testing Intact to touch and monofilament testing bilaterally: Yes Pulse Check Posterior Tibialis and Dorsalis pulse intact bilaterally: Yes Comments     BP 125/71   Pulse 78   Temp 98.1 F (36.7 C) (Temporal)   Ht _0  (1.676 m)   Wt 161 lb 8 oz (73.3 kg)   SpO2 98%   BMI 26.07 kg/m  Wt Readings from Last 3 Encounters:  03/12/22 161 lb 8 oz (73.3 kg)  01/24/22 156 lb 3.2 oz (70.9 kg)  01/22/22 157 lb (71.2 kg)    Health Maintenance Due  Topic Date Due   OPHTHALMOLOGY EXAM  09/27/2020   FOOT EXAM  11/30/2021   COLONOSCOPY (Pts 45-24yr Insurance coverage will need to be confirmed)  01/09/2022   Zoster Vaccines- Shingrix (2 of 2) 02/05/2022    There are no preventive care reminders to display for this patient.  Lab Results  Component Value Date   TSH 2.110 12/11/2021   Lab  Results  Component Value Date   WBC 8.7 12/27/2021   HGB 12.1 12/27/2021   HCT 37.1 12/27/2021   MCV 90 12/27/2021   PLT 190 12/27/2021   Lab Results  Component Value Date   NA 139 01/29/2022   K 4.6 01/29/2022   CO2 26 01/29/2022   GLUCOSE 336 (H) 01/29/2022   BUN 11 01/29/2022   CREATININE 1.27 (H) 01/29/2022   BILITOT 0.3 12/27/2021   ALKPHOS 69 12/27/2021   AST 12 12/27/2021   ALT 9 12/27/2021   PROT 6.6 12/27/2021   ALBUMIN 3.9 12/27/2021   CALCIUM 9.6 01/29/2022   ANIONGAP 11 12/24/2021   EGFR 45 (L) 01/29/2022   Lab Results  Component Value Date   CHOL 130 08/09/2021   Lab Results  Component Value Date   HDL 39 (L) 08/09/2021   Lab Results  Component Value Date   LDLCALC 73 08/09/2021   Lab Results  Component Value Date   TRIG 93 08/09/2021   Lab Results  Component Value Date   CHOLHDL 3.3 08/09/2021   Lab Results  Component Value Date   HGBA1C 8.0 (H) 11/02/2021      Assessment & Plan:   LCorianawas seen today for medical management of chronic issues, diabetes, hyperlipidemia and hypertension.  Diagnoses and all orders for this visit:  Type 2 diabetes mellitus with hyperglycemia, without long-term current use of insulin (HCC) A1c is 7.1 today, at goal of <8. Continue tresiba, novolog, and ozempic. Labs pending as below. Foot exam today. Micro albumin is UTD. On statin. Not on ACE or ARB. Eye exam is UTD but we do need records.  -     CBC with Differential/Platelet -     CMP14+EGFR -     Lipid panel -     Bayer DCA Hb A1c Waived  Hyperlipidemia associated with type 2 diabetes mellitus [place] On statin. Labs pending.  -  CBC with Differential/Platelet -     CMP14+EGFR -     Lipid panel  Hypertension associated with diabetes ([place]) Well controlled on current regimen. On Norvasc, atenolol, imdur, and lasix.  -     CBC with Differential/Platelet -     CMP14+EGFR -     Lipid panel  Depression, recurrent (HCC) Generalized anxiety  disorder On celexa. Denies SI. Does not desire change in treatment today.   Coronary artery disease involving native coronary artery of native heart with angina pectoris (HCC) Chronic congestive heart failure, unspecified heart failure type ([place]) Stable. Managed by cardiology. On imdur, lasix, statin, aspirin, atenolol, and norvasc.  -     CBC with Differential/Platelet -     CMP14+EGFR  Return in about 3 months (around 06/12/2022) for chronic follow up.   The patient indicates understanding of these issues and agrees with the plan.   Gwenlyn Perking, FNP

## 2022-03-13 LAB — CBC WITH DIFFERENTIAL/PLATELET
Basophils Absolute: 0 10*3/uL (ref 0.0–0.2)
Basos: 0 %
EOS (ABSOLUTE): 0.2 10*3/uL (ref 0.0–0.4)
Eos: 3 %
Hematocrit: 34.3 % (ref 34.0–46.6)
Hemoglobin: 11.9 g/dL (ref 11.1–15.9)
Immature Grans (Abs): 0 10*3/uL (ref 0.0–0.1)
Immature Granulocytes: 0 %
Lymphocytes Absolute: 2.8 10*3/uL (ref 0.7–3.1)
Lymphs: 43 %
MCH: 30.9 pg (ref 26.6–33.0)
MCHC: 34.7 g/dL (ref 31.5–35.7)
MCV: 89 fL (ref 79–97)
Monocytes Absolute: 0.5 10*3/uL (ref 0.1–0.9)
Monocytes: 7 %
Neutrophils Absolute: 3 10*3/uL (ref 1.4–7.0)
Neutrophils: 47 %
Platelets: 190 10*3/uL (ref 150–450)
RBC: 3.85 x10E6/uL (ref 3.77–5.28)
RDW: 13.6 % (ref 11.7–15.4)
WBC: 6.4 10*3/uL (ref 3.4–10.8)

## 2022-03-13 LAB — CMP14+EGFR
ALT: 13 IU/L (ref 0–32)
AST: 13 IU/L (ref 0–40)
Albumin/Globulin Ratio: 1.7 (ref 1.2–2.2)
Albumin: 4 g/dL (ref 3.7–4.7)
Alkaline Phosphatase: 69 IU/L (ref 44–121)
BUN/Creatinine Ratio: 7 — ABNORMAL LOW (ref 12–28)
BUN: 8 mg/dL (ref 8–27)
Bilirubin Total: 0.4 mg/dL (ref 0.0–1.2)
CO2: 25 mmol/L (ref 20–29)
Calcium: 9.5 mg/dL (ref 8.7–10.3)
Chloride: 103 mmol/L (ref 96–106)
Creatinine, Ser: 1.08 mg/dL — ABNORMAL HIGH (ref 0.57–1.00)
Globulin, Total: 2.4 g/dL (ref 1.5–4.5)
Glucose: 212 mg/dL — ABNORMAL HIGH (ref 70–99)
Potassium: 4 mmol/L (ref 3.5–5.2)
Sodium: 141 mmol/L (ref 134–144)
Total Protein: 6.4 g/dL (ref 6.0–8.5)
eGFR: 54 mL/min/{1.73_m2} — ABNORMAL LOW (ref >59)

## 2022-03-13 LAB — LIPID PANEL
Chol/HDL Ratio: 6.3 ratio — ABNORMAL HIGH (ref 0.0–4.4)
Cholesterol, Total: 222 mg/dL — ABNORMAL HIGH (ref 100–199)
HDL: 35 mg/dL — ABNORMAL LOW (ref >39)
LDL Chol Calc (NIH): 159 mg/dL — ABNORMAL HIGH (ref 0–99)
Triglycerides: 152 mg/dL — ABNORMAL HIGH (ref 0–149)
VLDL Cholesterol Cal: 28 mg/dL (ref 5–40)

## 2022-03-14 ENCOUNTER — Other Ambulatory Visit: Payer: Self-pay | Admitting: Family Medicine

## 2022-03-14 ENCOUNTER — Telehealth: Payer: Self-pay | Admitting: Family Medicine

## 2022-03-14 DIAGNOSIS — E1169 Type 2 diabetes mellitus with other specified complication: Secondary | ICD-10-CM

## 2022-03-14 MED ORDER — ROSUVASTATIN CALCIUM 20 MG PO TABS: 20.0000 mg | ORAL_TABLET | Freq: Every day | ORAL | 1 refills | Status: DC

## 2022-03-21 ENCOUNTER — Ambulatory Visit: Payer: PPO | Admitting: Urology

## 2022-03-25 ENCOUNTER — Other Ambulatory Visit: Payer: Self-pay

## 2022-03-25 ENCOUNTER — Encounter (HOSPITAL_COMMUNITY): Payer: Self-pay

## 2022-03-25 ENCOUNTER — Observation Stay (HOSPITAL_COMMUNITY)
Admission: EM | Admit: 2022-03-25 | Discharge: 2022-03-27 | Disposition: A | Discharge status: Home / Self Care | Payer: PPO | Attending: Cardiovascular Disease | Admitting: Cardiovascular Disease

## 2022-03-25 ENCOUNTER — Emergency Department (HOSPITAL_COMMUNITY): Payer: PPO

## 2022-03-25 DIAGNOSIS — Z7984 Long term (current) use of oral hypoglycemic drugs: Secondary | ICD-10-CM | POA: Diagnosis not present

## 2022-03-25 DIAGNOSIS — I10 Essential (primary) hypertension: Secondary | ICD-10-CM

## 2022-03-25 DIAGNOSIS — E1165 Type 2 diabetes mellitus with hyperglycemia: Secondary | ICD-10-CM

## 2022-03-25 DIAGNOSIS — E876 Hypokalemia: Secondary | ICD-10-CM | POA: Diagnosis not present

## 2022-03-25 DIAGNOSIS — R072 Precordial pain: Secondary | ICD-10-CM | POA: Diagnosis present

## 2022-03-25 DIAGNOSIS — I25119 Atherosclerotic heart disease of native coronary artery with unspecified angina pectoris: Secondary | ICD-10-CM

## 2022-03-25 DIAGNOSIS — Z79899 Other long term (current) drug therapy: Secondary | ICD-10-CM | POA: Insufficient documentation

## 2022-03-25 DIAGNOSIS — Z8673 Personal history of transient ischemic attack (TIA), and cerebral infarction without residual deficits: Secondary | ICD-10-CM | POA: Diagnosis not present

## 2022-03-25 DIAGNOSIS — F419 Anxiety disorder, unspecified: Secondary | ICD-10-CM

## 2022-03-25 DIAGNOSIS — F32A Depression, unspecified: Secondary | ICD-10-CM

## 2022-03-25 DIAGNOSIS — F339 Major depressive disorder, recurrent, unspecified: Secondary | ICD-10-CM

## 2022-03-25 DIAGNOSIS — Z955 Presence of coronary angioplasty implant and graft: Secondary | ICD-10-CM | POA: Insufficient documentation

## 2022-03-25 DIAGNOSIS — Z794 Long term (current) use of insulin: Secondary | ICD-10-CM

## 2022-03-25 DIAGNOSIS — G2581 Restless legs syndrome: Secondary | ICD-10-CM | POA: Diagnosis not present

## 2022-03-25 DIAGNOSIS — I2510 Atherosclerotic heart disease of native coronary artery without angina pectoris: Secondary | ICD-10-CM | POA: Diagnosis not present

## 2022-03-25 DIAGNOSIS — E1159 Type 2 diabetes mellitus with other circulatory complications: Secondary | ICD-10-CM

## 2022-03-25 DIAGNOSIS — Z7982 Long term (current) use of aspirin: Secondary | ICD-10-CM | POA: Diagnosis not present

## 2022-03-25 DIAGNOSIS — I25110 Atherosclerotic heart disease of native coronary artery with unstable angina pectoris: Secondary | ICD-10-CM | POA: Diagnosis not present

## 2022-03-25 DIAGNOSIS — R079 Chest pain, unspecified: Secondary | ICD-10-CM

## 2022-03-25 DIAGNOSIS — E782 Mixed hyperlipidemia: Secondary | ICD-10-CM

## 2022-03-25 DIAGNOSIS — R0602 Shortness of breath: Secondary | ICD-10-CM | POA: Diagnosis not present

## 2022-03-25 DIAGNOSIS — R0789 Other chest pain: Secondary | ICD-10-CM | POA: Diagnosis not present

## 2022-03-25 DIAGNOSIS — E1169 Type 2 diabetes mellitus with other specified complication: Secondary | ICD-10-CM

## 2022-03-25 DIAGNOSIS — K5730 Diverticulosis of large intestine without perforation or abscess without bleeding: Secondary | ICD-10-CM | POA: Diagnosis not present

## 2022-03-25 DIAGNOSIS — K760 Fatty (change of) liver, not elsewhere classified: Secondary | ICD-10-CM | POA: Diagnosis not present

## 2022-03-25 DIAGNOSIS — R112 Nausea with vomiting, unspecified: Secondary | ICD-10-CM

## 2022-03-25 LAB — BASIC METABOLIC PANEL
Anion gap: 8 (ref 5–15)
BUN: 11 mg/dL (ref 8–23)
CO2: 30 mmol/L (ref 22–32)
Calcium: 9.1 mg/dL (ref 8.9–10.3)
Chloride: 101 mmol/L (ref 98–111)
Creatinine, Ser: 1.15 mg/dL — ABNORMAL HIGH (ref 0.44–1.00)
GFR, Estimated: 50 mL/min — ABNORMAL LOW (ref >60)
Glucose, Bld: 270 mg/dL — ABNORMAL HIGH (ref 70–99)
Potassium: 3 mmol/L — ABNORMAL LOW (ref 3.5–5.1)
Sodium: 139 mmol/L (ref 135–145)

## 2022-03-25 LAB — CBC
HCT: 33.7 % — ABNORMAL LOW (ref 36.0–46.0)
Hemoglobin: 11.9 g/dL — ABNORMAL LOW (ref 12.0–15.0)
MCH: 31.2 pg (ref 26.0–34.0)
MCHC: 35.3 g/dL (ref 30.0–36.0)
MCV: 88.5 fL (ref 80.0–100.0)
Platelets: 189 10*3/uL (ref 150–400)
RBC: 3.81 MIL/uL — ABNORMAL LOW (ref 3.87–5.11)
RDW: 13.5 % (ref 11.5–15.5)
WBC: 8 10*3/uL (ref 4.0–10.5)
nRBC: 0 % (ref 0.0–0.2)

## 2022-03-25 LAB — TROPONIN I (HIGH SENSITIVITY)
Troponin I (High Sensitivity): 11 ng/L (ref <18)
Troponin I (High Sensitivity): 11 ng/L (ref <18)

## 2022-03-25 MED ORDER — DIPHENHYDRAMINE HCL 50 MG/ML IJ SOLN
50.0000 mg | Freq: Once | INTRAMUSCULAR | Status: AC
  Administered 2022-03-26: 50 mg via INTRAVENOUS
  Filled 2022-03-25: qty 1

## 2022-03-25 MED ORDER — DIPHENHYDRAMINE HCL 25 MG PO CAPS: 50.0000 mg | ORAL_CAPSULE | Freq: Once | ORAL | Status: AC

## 2022-03-25 MED ORDER — METHYLPREDNISOLONE SODIUM SUCC 40 MG IJ SOLR
40.0000 mg | Freq: Once | INTRAMUSCULAR | Status: AC
  Administered 2022-03-25: 40 mg via INTRAVENOUS
  Filled 2022-03-25: qty 1

## 2022-03-25 NOTE — ED Triage Notes (Signed)
Pt egan having heaviness in chest yesterday - 2 took Nitro today with some relief.  Pt took 325 mg PO .  Pt has hx of 3 stents.  Pt vomited x5 this am as well.

## 2022-03-25 NOTE — ED Provider Notes (Signed)
[facility] EMERGENCY DEPARTMENT Provider Note   CSN: [id number] Arrival date & time: 03/25/22  1906     History  Chief Complaint  Patient presents with   Chest Pain    [name] is a 71 y.o. female.   Chest Pain   Patient with medical history of hypertension, hyperlipidemia, MS, CAD, GERD, depression, diabetes presents today due to chest pain.  Chest pain started yesterday, its been constant.  It feels like pressure and is in the center of her chest.  Associated with nausea and 2 episodes of emesis earlier this morning.  She took 325 of aspirin which did not improve the pain.  She did take 2 nitros which improved the pain but did not fully resolve it.  States this feels like her previous heart attack.  She also has pain that radiates to the back between her shoulder blades.  She feels short of breath.  Home Medications Prior to Admission medications   Medication Sig Start Date End Date Taking? Authorizing Provider  amLODipine (NORVASC) 10 MG tablet Take 1 tablet (10 mg total) by mouth daily. 10/12/20   Roxan Hockey, MD  aspirin 325 MG tablet Take 325 mg by mouth daily.    [provider]  atenolol (TENORMIN) 50 MG tablet Take 1 tablet (50 mg total) by mouth 2 (two) times daily. 04/10/21   Gwenlyn Perking, FNP  calcium carbonate (OS-CAL) 600 MG TABS tablet Take 1 tablet (600 mg total) by mouth 2 (two) times daily with a meal. 07/19/20   Gwenlyn Perking, FNP  citalopram (CELEXA) 40 MG tablet Take 40 mg by mouth daily.    [provider]  Continuous Blood Gluc Receiver (FREESTYLE LIBRE 2 READER) DEVI USE TO TEST BLOOD SUGAR 6 TIMES DAILY. Dx:E11.65 05/19/21   Gwenlyn Perking, FNP  Continuous Blood Gluc Sensor (FREESTYLE LIBRE 2 SENSOR) MISC USE TO TEST BLOOD SUGAR 6 TIMES DAILY 02/23/22   Gwenlyn Perking, FNP  furosemide (LASIX) 20 MG tablet TAKE ONE (1) TABLET EACH DAY 01/31/22   Gwenlyn Perking, FNP  hydrOXYzine (ATARAX/VISTARIL) 10 MG tablet Take 1  tablet (10 mg total) by mouth 3 (three) times daily as needed. 03/22/21   Gwenlyn Perking, FNP  insulin degludec (TRESIBA FLEXTOUCH) 100 UNIT/ML FlexTouch Pen Inject 60 Units into the skin daily.    [provider]  isosorbide mononitrate (IMDUR) 60 MG 24 hr tablet TAKE ONE (1) TABLET EACH DAY 03/02/21   Gwenlyn Perking, FNP  levocetirizine (XYZAL) 5 MG tablet Take 1 tablet (5 mg total) by mouth every evening. 01/24/22   Loman Brooklyn, FNP  meclizine (ANTIVERT) 25 MG tablet Take 1 tablet (25 mg total) by mouth 3 (three) times daily as needed for dizziness. 11/26/19   Dettinger, Fransisca Kaufmann, MD  meloxicam (MOBIC) 15 MG tablet Take 1 tablet (15 mg total) by mouth daily. Patient not taking: Reported on 03/12/2022 12/27/21   Gwenlyn Perking, FNP  nitroGLYCERIN (NITROSTAT) 0.4 MG SL tablet DISSOLVE 1 TABLET UNDER TONGUE FOR CHESTPAIN.MAY REPEAT EVERY 5 MINUTES FOR 3 DOSES.IF NO RELIEF CALL 911 OR GO TO ER 06/08/20   Gwenlyn Perking, FNP  NOVOLOG FLEXPEN 100 UNIT/ML FlexPen INJECT 35-40 UNITS SQ 3 TIMES DAILY WITH MEALS Patient taking differently: 20-30 Units 3 (three) times daily with meals. Takes 8-30 units TID with meals depending on glucose 09/15/19   Dettinger, Fransisca Kaufmann, MD  ondansetron (ZOFRAN) 8 MG tablet Take 0.5 tablets (4 mg total) by  mouth every 6 (six) hours as needed. 09/28/20   Gwenlyn Perking, FNP  potassium chloride (KLOR-CON M) 10 MEQ tablet Take 1 tablet (10 mEq total) by mouth daily. Patient not taking: Reported on 03/12/2022 12/28/21   Gwenlyn Perking, FNP  rOPINIRole (REQUIP) 0.5 MG tablet Take 2 tablets (1 mg total) by mouth at bedtime. 01/09/21   Gwenlyn Perking, FNP  rosuvastatin (CRESTOR) 20 MG tablet Take 1 tablet (20 mg total) by mouth daily. 03/14/22   Gwenlyn Perking, FNP  Semaglutide, 1 MG/DOSE, (OZEMPIC, 1 MG/DOSE,) 2 MG/1.5ML SOPN Inject 1 mg into the skin once a week.    [provider]  sertraline (ZOLOFT) 50 MG tablet Take 1 tablet (50 mg total) by mouth  daily. 11/25/20   Gwenlyn Perking, FNP  traZODone (DESYREL) 100 MG tablet Take 1 tablet (100 mg total) by mouth at bedtime as needed for sleep. 01/15/22   Gwenlyn Perking, FNP  triamcinolone ointment (KENALOG) 0.5 % Apply 1 application. topically 2 (two) times daily. 01/15/22   Gwenlyn Perking, FNP  vitamin B-12 (CYANOCOBALAMIN) 1000 MCG tablet Take 1,000 mcg by mouth daily. Patient not taking: Reported on 03/12/2022    [provider]  Vitamin D, Ergocalciferol, (DRISDOL) 1.25 MG (50000 UNIT) CAPS capsule TAKE 1 CAPSULE EVERY 7 DAYS Patient not taking: Reported on 03/12/2022 07/19/20   Gwenlyn Perking, FNP      Allergies    Iohexol, Ticlid [ticlopidine hcl], Jardiance [empagliflozin], Metformin and related, and Codeine    Review of Systems   Review of Systems  Cardiovascular:  Positive for chest pain.    Physical Exam Updated Vital Signs BP (!) 191/74   Pulse 73   Temp 98.8 F (37.1 C) (Oral)   Resp 14   Ht '5\' 6"'$  (1.676 m)   Wt 72.6 kg   SpO2 96%   BMI 25.82 kg/m  Physical Exam Vitals and nursing note reviewed. Exam conducted with a chaperone present.  Constitutional:      Appearance: Normal appearance. She is ill-appearing.  HENT:     Head: Normocephalic and atraumatic.  Eyes:     General: No scleral icterus.       Right eye: No discharge.        Left eye: No discharge.     Extraocular Movements: Extraocular movements intact.     Pupils: Pupils are equal, round, and reactive to light.  Cardiovascular:     Rate and Rhythm: Normal rate and regular rhythm.     Pulses: Normal pulses.     Heart sounds: Normal heart sounds. No murmur heard.    No friction rub. No gallop.  Pulmonary:     Effort: Pulmonary effort is normal. No respiratory distress.     Breath sounds: Normal breath sounds.  Abdominal:     General: Abdomen is flat. Bowel sounds are normal. There is no distension.     Palpations: Abdomen is soft.     Tenderness: There is no abdominal tenderness.   Musculoskeletal:     Right lower leg: No edema.     Left lower leg: No edema.     Comments: Linear abrasion to left lower extremity  Skin:    General: Skin is warm and dry.     Coloration: Skin is not jaundiced.  Neurological:     Mental Status: She is alert. Mental status is at baseline.     Coordination: Coordination normal.     ED Results / Procedures /  Treatments   Labs (all labs ordered are listed, but only abnormal results are displayed) Labs Reviewed  BASIC METABOLIC PANEL - Abnormal; Notable for the following components:      Result Value   Potassium 3.0 (*)    Glucose, Bld 270 (*)    Creatinine, Ser 1.15 (*)    GFR, Estimated 50 (*)    All other components within normal limits  CBC - Abnormal; Notable for the following components:   RBC 3.81 (*)    Hemoglobin 11.9 (*)    HCT 33.7 (*)    All other components within normal limits  TROPONIN I (HIGH SENSITIVITY)  TROPONIN I (HIGH SENSITIVITY)    EKG EKG Interpretation  Date/Time:  Sunday March 25 2022 19:16:24 EDT Ventricular Rate:  91 PR Interval:  214 QRS Duration: 89 QT Interval:  384 QTC Calculation: 473 R Axis:   -23 Text Interpretation: Sinus rhythm Borderline prolonged PR interval Borderline left axis deviation Borderline low voltage, extremity leads Baseline wander in lead(s) V4 No significant change since last tracing Confirmed by Isla Pence [phone]) on 03/25/2022 7:28:37 PM  Radiology DG Chest Port 1 View  Result Date: 03/25/2022 CLINICAL DATA:  Chest pain EXAM: PORTABLE CHEST 1 VIEW COMPARISON:  11/15/2020 FINDINGS: Stable cardiomediastinal contours. Aortic atherosclerosis. No focal airspace consolidation, pleural effusion, or pneumothorax. IMPRESSION: No active disease. Electronically Signed   By: Davina Poke D.O.   On: 03/25/2022 20:09    Procedures Procedures    Medications Ordered in ED Medications  diphenhydrAMINE (BENADRYL) capsule 50 mg (has no administration in time range)    Or   diphenhydrAMINE (BENADRYL) injection 50 mg (has no administration in time range)  methylPREDNISolone sodium succinate (SOLU-MEDROL) 40 mg/mL injection 40 mg (40 mg Intravenous Given 03/25/22 2255)    ED Course/ Medical Decision Making/ A&P Clinical Course as of 03/25/22 2314  Sun Mar 25, 2022  2231 I spoke with Dr. Orie Rout with hospitalist service. Requests we obtain dissection study to rule out dissection and consult again once we have results [HS]    Clinical Course User Index [HS] Sherrill Raring, PA-C                           Medical Decision Making Amount and/or Complexity of Data Reviewed Labs: ordered. Radiology: ordered.  Risk Prescription drug management. Decision regarding hospitalization.   Patient presents due to chest pressure/pain.  Differential includes not limited to ACS, pericarditis, pneumonia, dissection, PE, ruptured esophagitis.  Patient is hypertensive, otherwise exam is not revealing.  Not febrile, regular rhythm.  The back that shows only thing that improves the pain is concerning for ACS.  Aspirin was already given prior to arrival.  Patient is accompanied by her daughter.  Negative delta troponins.  CBC is without leukocytosis, slight anemia with a hemoglobin 11.9.  Mild hypokalemia at 3.0 and patient is hyperglycemic at 270 slight bump in creatinine 1.15.  EKG shows sinus rhythm without any new changes or ischemic findings.  Chest x-ray does not show any active process.  I viewed cardiac monitoring patient is in sinus rhythm.  On reevaluation patient is still having persistent pressure.  I am concerned about ACS despite negative delta troponins in this patient.  She has a lot of cardiac risk factors including hypertension, hyperlipidemia, diabetes and others as listed in the HPI.   I reviewed external records and patient has not had an echocardiogram since 2018 which showed grade 1 diastolic function.  She had a left heart cath in 2018 showing  significant stenosis 50% of the mid LAD proximal to the previously placed stent which was not hemodynamically significant at that time but has not had any other procedures.  Given patient is having persistent chest pressure which is only been alleviated by nitroglycerin I think she should be admitted for trending troponins and cardiac evaluation tomorrow morning.  I will consult hospitalist for admission.  Dr. Orie Rout requesting a dissection study prior to accepting patient.  I have put in the orders, patient will need premedication due to risk of contrast allergy.  Plan is for admission to hospitalist service for persistent chest pain - pending results of dissection study.  If positive obviously that be different disposition.   Patient care is signed out to Dr. Christy Gentles at shift change pending results of study.        Final Clinical Impression(s) / ED Diagnoses Final diagnoses:  Chest pain, unspecified type    Rx / DC Orders ED Discharge Orders     None         Sherrill Raring, Hershal Coria 03/25/22 2314    Isla Pence, MD 03/26/22 [phone]

## 2022-03-26 ENCOUNTER — Encounter (HOSPITAL_COMMUNITY)
Admission: EM | Disposition: A | Discharge status: Home / Self Care | Payer: Self-pay | Source: Home / Self Care | Attending: Emergency Medicine

## 2022-03-26 ENCOUNTER — Emergency Department (HOSPITAL_COMMUNITY): Payer: PPO

## 2022-03-26 ENCOUNTER — Encounter (HOSPITAL_COMMUNITY): Payer: Self-pay | Admitting: Internal Medicine

## 2022-03-26 DIAGNOSIS — E876 Hypokalemia: Secondary | ICD-10-CM

## 2022-03-26 DIAGNOSIS — E782 Mixed hyperlipidemia: Secondary | ICD-10-CM

## 2022-03-26 DIAGNOSIS — I25110 Atherosclerotic heart disease of native coronary artery with unstable angina pectoris: Secondary | ICD-10-CM | POA: Diagnosis not present

## 2022-03-26 DIAGNOSIS — I2510 Atherosclerotic heart disease of native coronary artery without angina pectoris: Secondary | ICD-10-CM

## 2022-03-26 DIAGNOSIS — E785 Hyperlipidemia, unspecified: Secondary | ICD-10-CM | POA: Diagnosis not present

## 2022-03-26 DIAGNOSIS — K5730 Diverticulosis of large intestine without perforation or abscess without bleeding: Secondary | ICD-10-CM | POA: Diagnosis not present

## 2022-03-26 DIAGNOSIS — Z8673 Personal history of transient ischemic attack (TIA), and cerebral infarction without residual deficits: Secondary | ICD-10-CM | POA: Diagnosis not present

## 2022-03-26 DIAGNOSIS — Z955 Presence of coronary angioplasty implant and graft: Secondary | ICD-10-CM | POA: Diagnosis not present

## 2022-03-26 DIAGNOSIS — R079 Chest pain, unspecified: Secondary | ICD-10-CM | POA: Diagnosis not present

## 2022-03-26 DIAGNOSIS — E1165 Type 2 diabetes mellitus with hyperglycemia: Secondary | ICD-10-CM

## 2022-03-26 DIAGNOSIS — G2581 Restless legs syndrome: Secondary | ICD-10-CM | POA: Diagnosis not present

## 2022-03-26 DIAGNOSIS — Z7982 Long term (current) use of aspirin: Secondary | ICD-10-CM | POA: Diagnosis not present

## 2022-03-26 DIAGNOSIS — I10 Essential (primary) hypertension: Secondary | ICD-10-CM

## 2022-03-26 DIAGNOSIS — Z794 Long term (current) use of insulin: Secondary | ICD-10-CM

## 2022-03-26 DIAGNOSIS — Z7984 Long term (current) use of oral hypoglycemic drugs: Secondary | ICD-10-CM | POA: Diagnosis not present

## 2022-03-26 DIAGNOSIS — K760 Fatty (change of) liver, not elsewhere classified: Secondary | ICD-10-CM | POA: Diagnosis not present

## 2022-03-26 DIAGNOSIS — Z79899 Other long term (current) drug therapy: Secondary | ICD-10-CM | POA: Diagnosis not present

## 2022-03-26 DIAGNOSIS — R0602 Shortness of breath: Secondary | ICD-10-CM | POA: Diagnosis not present

## 2022-03-26 DIAGNOSIS — R112 Nausea with vomiting, unspecified: Secondary | ICD-10-CM

## 2022-03-26 HISTORY — PX: LEFT HEART CATH AND CORONARY ANGIOGRAPHY: CATH118249

## 2022-03-26 HISTORY — PX: CORONARY STENT INTERVENTION: CATH118234

## 2022-03-26 LAB — COMPREHENSIVE METABOLIC PANEL
ALT: 15 U/L (ref 0–44)
AST: 19 U/L (ref 15–41)
Albumin: 3.6 g/dL (ref 3.5–5.0)
Alkaline Phosphatase: 52 U/L (ref 38–126)
Anion gap: 13 (ref 5–15)
BUN: 13 mg/dL (ref 8–23)
CO2: 24 mmol/L (ref 22–32)
Calcium: 8.8 mg/dL — ABNORMAL LOW (ref 8.9–10.3)
Chloride: 99 mmol/L (ref 98–111)
Creatinine, Ser: 1 mg/dL (ref 0.44–1.00)
GFR, Estimated: 59 mL/min — ABNORMAL LOW (ref >60)
Glucose, Bld: 395 mg/dL — ABNORMAL HIGH (ref 70–99)
Potassium: 3.6 mmol/L (ref 3.5–5.1)
Sodium: 136 mmol/L (ref 135–145)
Total Bilirubin: 1.3 mg/dL — ABNORMAL HIGH (ref 0.3–1.2)
Total Protein: 7.1 g/dL (ref 6.5–8.1)

## 2022-03-26 LAB — URINALYSIS, COMPLETE (UACMP) WITH MICROSCOPIC
Bilirubin Urine: NEGATIVE
Glucose, UA: >=500 mg/dL — AB
Ketones, ur: 20 mg/dL — AB
Leukocytes,Ua: NEGATIVE
Nitrite: NEGATIVE
Protein, ur: 30 mg/dL — AB
Specific Gravity, Urine: 1.03 (ref 1.005–1.030)
pH: 6 (ref 5.0–8.0)

## 2022-03-26 LAB — APTT: aPTT: 27 seconds (ref 24–36)

## 2022-03-26 LAB — POCT ACTIVATED CLOTTING TIME
Activated Clotting Time: 257 seconds
Activated Clotting Time: 275 seconds

## 2022-03-26 LAB — GLUCOSE, CAPILLARY
Glucose-Capillary: 387 mg/dL — ABNORMAL HIGH (ref 70–99)
Glucose-Capillary: 335 mg/dL — ABNORMAL HIGH (ref 70–99)
Glucose-Capillary: 245 mg/dL — ABNORMAL HIGH (ref 70–99)
Glucose-Capillary: 300 mg/dL — ABNORMAL HIGH (ref 70–99)
Glucose-Capillary: 483 mg/dL — ABNORMAL HIGH (ref 70–99)

## 2022-03-26 LAB — CBC
HCT: 34.7 % — ABNORMAL LOW (ref 36.0–46.0)
Hemoglobin: 12.3 g/dL (ref 12.0–15.0)
MCH: 31.3 pg (ref 26.0–34.0)
MCHC: 35.4 g/dL (ref 30.0–36.0)
MCV: 88.3 fL (ref 80.0–100.0)
Platelets: 189 10*3/uL (ref 150–400)
RBC: 3.93 MIL/uL (ref 3.87–5.11)
RDW: 13.4 % (ref 11.5–15.5)
WBC: 10.8 10*3/uL — ABNORMAL HIGH (ref 4.0–10.5)
nRBC: 0 % (ref 0.0–0.2)

## 2022-03-26 LAB — MAGNESIUM: Magnesium: 1.7 mg/dL (ref 1.7–2.4)

## 2022-03-26 LAB — PHOSPHORUS: Phosphorus: 3.7 mg/dL (ref 2.5–4.6)

## 2022-03-26 SURGERY — LEFT HEART CATH AND CORONARY ANGIOGRAPHY
Anesthesia: LOCAL

## 2022-03-26 MED ORDER — PANTOPRAZOLE SODIUM 40 MG IV SOLR
40.0000 mg | INTRAVENOUS | Status: DC
  Administered 2022-03-26 – 2022-03-27 (×2): 40 mg via INTRAVENOUS
  Filled 2022-03-26 (×2): qty 10

## 2022-03-26 MED ORDER — ACETAMINOPHEN 650 MG RE SUPP: 650.0000 mg | Freq: Four times a day (QID) | RECTAL | Status: DC | PRN

## 2022-03-26 MED ORDER — FUROSEMIDE 20 MG PO TABS
20.0000 mg | ORAL_TABLET | Freq: Every day | ORAL | Status: DC
  Administered 2022-03-26 – 2022-03-27 (×2): 20 mg via ORAL
  Filled 2022-03-26 (×2): qty 1

## 2022-03-26 MED ORDER — ONDANSETRON HCL 4 MG/2ML IJ SOLN: 4.0000 mg | Freq: Four times a day (QID) | INTRAMUSCULAR | Status: DC | PRN

## 2022-03-26 MED ORDER — ISOSORBIDE MONONITRATE ER 60 MG PO TB24
60.0000 mg | ORAL_TABLET | Freq: Every day | ORAL | Status: DC
  Administered 2022-03-26 – 2022-03-27 (×2): 60 mg via ORAL
  Filled 2022-03-26 (×2): qty 1

## 2022-03-26 MED ORDER — HEPARIN SODIUM (PORCINE) 1000 UNIT/ML IJ SOLN
INTRAMUSCULAR | Status: DC | PRN
  Administered 2022-03-26: 4500 [IU] via INTRAVENOUS
  Administered 2022-03-26: 3500 [IU] via INTRAVENOUS
  Administered 2022-03-26: 2500 [IU] via INTRAVENOUS

## 2022-03-26 MED ORDER — SODIUM CHLORIDE 0.9 % IV SOLN: INTRAVENOUS | Status: AC

## 2022-03-26 MED ORDER — HYDRALAZINE HCL 20 MG/ML IJ SOLN: 10.0000 mg | INTRAMUSCULAR | Status: AC | PRN

## 2022-03-26 MED ORDER — ACETAMINOPHEN 325 MG PO TABS: 650.0000 mg | ORAL_TABLET | Freq: Four times a day (QID) | ORAL | Status: DC | PRN

## 2022-03-26 MED ORDER — ROSUVASTATIN CALCIUM 20 MG PO TABS
20.0000 mg | ORAL_TABLET | Freq: Every day | ORAL | Status: DC
  Administered 2022-03-26 – 2022-03-27 (×2): 20 mg via ORAL
  Filled 2022-03-26 (×2): qty 1

## 2022-03-26 MED ORDER — VERAPAMIL HCL 2.5 MG/ML IV SOLN
INTRA_ARTERIAL | Status: DC | PRN
  Administered 2022-03-26: 15 mL via INTRA_ARTERIAL

## 2022-03-26 MED ORDER — HEPARIN (PORCINE) IN NACL 1000-0.9 UT/500ML-% IV SOLN
INTRAVENOUS | Status: DC | PRN
  Administered 2022-03-26 (×2): 500 mL

## 2022-03-26 MED ORDER — FENTANYL CITRATE (PF) 100 MCG/2ML IJ SOLN
INTRAMUSCULAR | Status: AC
  Filled 2022-03-26: qty 2

## 2022-03-26 MED ORDER — ASPIRIN 81 MG PO CHEW
81.0000 mg | CHEWABLE_TABLET | Freq: Every day | ORAL | Status: DC
  Administered 2022-03-27: 81 mg via ORAL
  Filled 2022-03-26: qty 1

## 2022-03-26 MED ORDER — MIDAZOLAM HCL 2 MG/2ML IJ SOLN
INTRAMUSCULAR | Status: DC | PRN
  Administered 2022-03-26: 1 mg via INTRAVENOUS

## 2022-03-26 MED ORDER — IOHEXOL 350 MG/ML SOLN
INTRAVENOUS | Status: DC | PRN
  Administered 2022-03-26: 180 mL via INTRA_ARTERIAL

## 2022-03-26 MED ORDER — AMLODIPINE BESYLATE 10 MG PO TABS
10.0000 mg | ORAL_TABLET | Freq: Every day | ORAL | Status: DC
  Administered 2022-03-26 – 2022-03-27 (×2): 10 mg via ORAL
  Filled 2022-03-26: qty 2
  Filled 2022-03-26: qty 1

## 2022-03-26 MED ORDER — POTASSIUM CHLORIDE CRYS ER 20 MEQ PO TBCR
40.0000 meq | EXTENDED_RELEASE_TABLET | Freq: Once | ORAL | Status: AC
  Administered 2022-03-26: 40 meq via ORAL
  Filled 2022-03-26: qty 2

## 2022-03-26 MED ORDER — INSULIN GLARGINE-YFGN 100 UNIT/ML ~~LOC~~ SOLN
10.0000 [IU] | Freq: Every day | SUBCUTANEOUS | Status: DC
  Filled 2022-03-26: qty 0.1

## 2022-03-26 MED ORDER — NITROGLYCERIN 1 MG/10 ML FOR IR/CATH LAB
INTRA_ARTERIAL | Status: AC
  Filled 2022-03-26: qty 10

## 2022-03-26 MED ORDER — SODIUM CHLORIDE 0.9% FLUSH
3.0000 mL | Freq: Two times a day (BID) | INTRAVENOUS | Status: DC
  Administered 2022-03-27: 3 mL via INTRAVENOUS

## 2022-03-26 MED ORDER — METHYLPREDNISOLONE SODIUM SUCC 40 MG IJ SOLR
40.0000 mg | INTRAMUSCULAR | Status: DC
  Administered 2022-03-26 (×2): 40 mg via INTRAVENOUS
  Filled 2022-03-26 (×2): qty 1

## 2022-03-26 MED ORDER — SODIUM CHLORIDE 0.9 % WEIGHT BASED INFUSION: 1.0000 mL/kg/h | INTRAVENOUS | Status: DC

## 2022-03-26 MED ORDER — FAMOTIDINE IN NACL 20-0.9 MG/50ML-% IV SOLN
INTRAVENOUS | Status: AC | PRN
  Administered 2022-03-26: 20 mg via INTRAVENOUS

## 2022-03-26 MED ORDER — SODIUM CHLORIDE 0.9 % WEIGHT BASED INFUSION: 3.0000 mL/kg/h | INTRAVENOUS | Status: DC

## 2022-03-26 MED ORDER — CLOPIDOGREL BISULFATE 75 MG PO TABS
75.0000 mg | ORAL_TABLET | Freq: Every day | ORAL | Status: DC
  Administered 2022-03-27: 75 mg via ORAL
  Filled 2022-03-26: qty 1

## 2022-03-26 MED ORDER — ROPINIROLE HCL 0.5 MG PO TABS
1.0000 mg | ORAL_TABLET | Freq: Every day | ORAL | Status: DC
  Administered 2022-03-26: 1 mg via ORAL
  Filled 2022-03-26: qty 2

## 2022-03-26 MED ORDER — LIDOCAINE HCL (PF) 1 % IJ SOLN
INTRAMUSCULAR | Status: AC
  Filled 2022-03-26: qty 30

## 2022-03-26 MED ORDER — INSULIN GLARGINE-YFGN 100 UNIT/ML ~~LOC~~ SOLN
30.0000 [IU] | Freq: Every day | SUBCUTANEOUS | Status: DC
  Administered 2022-03-26: 30 [IU] via SUBCUTANEOUS
  Filled 2022-03-26 (×2): qty 0.3

## 2022-03-26 MED ORDER — SODIUM CHLORIDE 0.9 % IV SOLN: INTRAVENOUS | Status: DC

## 2022-03-26 MED ORDER — SODIUM CHLORIDE 0.9% FLUSH: 3.0000 mL | INTRAVENOUS | Status: DC | PRN

## 2022-03-26 MED ORDER — FAMOTIDINE IN NACL 20-0.9 MG/50ML-% IV SOLN
INTRAVENOUS | Status: AC
  Filled 2022-03-26: qty 50

## 2022-03-26 MED ORDER — CLOPIDOGREL BISULFATE 300 MG PO TABS
ORAL_TABLET | ORAL | Status: DC | PRN
  Administered 2022-03-26: 600 mg via ORAL

## 2022-03-26 MED ORDER — LABETALOL HCL 5 MG/ML IV SOLN: 10.0000 mg | INTRAVENOUS | Status: AC | PRN

## 2022-03-26 MED ORDER — ATENOLOL 25 MG PO TABS
50.0000 mg | ORAL_TABLET | Freq: Two times a day (BID) | ORAL | Status: DC
  Administered 2022-03-26 – 2022-03-27 (×3): 50 mg via ORAL
  Filled 2022-03-26 (×3): qty 2

## 2022-03-26 MED ORDER — HEPARIN (PORCINE) IN NACL 1000-0.9 UT/500ML-% IV SOLN
INTRAVENOUS | Status: AC
  Filled 2022-03-26: qty 1000

## 2022-03-26 MED ORDER — LIDOCAINE HCL (PF) 1 % IJ SOLN
INTRAMUSCULAR | Status: DC | PRN
  Administered 2022-03-26: 2 mL via INTRADERMAL

## 2022-03-26 MED ORDER — INSULIN ASPART 100 UNIT/ML IJ SOLN
16.0000 [IU] | Freq: Once | INTRAMUSCULAR | Status: AC
  Administered 2022-03-26: 16 [IU] via SUBCUTANEOUS

## 2022-03-26 MED ORDER — POTASSIUM CHLORIDE 10 MEQ/100ML IV SOLN: 10.0000 meq | INTRAVENOUS | Status: DC

## 2022-03-26 MED ORDER — INSULIN ASPART 100 UNIT/ML IJ SOLN
0.0000 [IU] | Freq: Three times a day (TID) | INTRAMUSCULAR | Status: DC
  Administered 2022-03-27: 8 [IU] via SUBCUTANEOUS

## 2022-03-26 MED ORDER — ONDANSETRON HCL 4 MG/2ML IJ SOLN
4.0000 mg | Freq: Four times a day (QID) | INTRAMUSCULAR | Status: DC | PRN
  Administered 2022-03-26: 4 mg via INTRAVENOUS
  Filled 2022-03-26: qty 2

## 2022-03-26 MED ORDER — IOHEXOL 350 MG/ML SOLN
100.0000 mL | Freq: Once | INTRAVENOUS | Status: AC | PRN
  Administered 2022-03-26: 100 mL via INTRAVENOUS

## 2022-03-26 MED ORDER — ASPIRIN 81 MG PO TBEC
81.0000 mg | DELAYED_RELEASE_TABLET | Freq: Every day | ORAL | Status: DC
  Administered 2022-03-26: 81 mg via ORAL
  Filled 2022-03-26: qty 1

## 2022-03-26 MED ORDER — ACETAMINOPHEN 325 MG PO TABS: 650.0000 mg | ORAL_TABLET | ORAL | Status: DC | PRN

## 2022-03-26 MED ORDER — FENTANYL CITRATE (PF) 100 MCG/2ML IJ SOLN
INTRAMUSCULAR | Status: DC | PRN
  Administered 2022-03-26: 25 ug via INTRAVENOUS

## 2022-03-26 MED ORDER — CLOPIDOGREL BISULFATE 300 MG PO TABS
ORAL_TABLET | ORAL | Status: AC
  Filled 2022-03-26: qty 2

## 2022-03-26 MED ORDER — SODIUM CHLORIDE 0.9 % IV SOLN: 250.0000 mL | INTRAVENOUS | Status: DC | PRN

## 2022-03-26 MED ORDER — ASPIRIN 325 MG PO TABS: 325.0000 mg | ORAL_TABLET | Freq: Every day | ORAL | Status: DC

## 2022-03-26 MED ORDER — INSULIN ASPART 100 UNIT/ML IJ SOLN: 0.0000 [IU] | Freq: Every day | INTRAMUSCULAR | Status: DC

## 2022-03-26 MED ORDER — SERTRALINE HCL 50 MG PO TABS
50.0000 mg | ORAL_TABLET | Freq: Every day | ORAL | Status: DC
  Administered 2022-03-26 – 2022-03-27 (×2): 50 mg via ORAL
  Filled 2022-03-26 (×2): qty 1

## 2022-03-26 MED ORDER — DIPHENHYDRAMINE HCL 50 MG/ML IJ SOLN
25.0000 mg | Freq: Once | INTRAMUSCULAR | Status: AC
  Administered 2022-03-26: 25 mg via INTRAVENOUS

## 2022-03-26 MED ORDER — HYDRALAZINE HCL 20 MG/ML IJ SOLN
10.0000 mg | Freq: Four times a day (QID) | INTRAMUSCULAR | Status: DC | PRN
  Administered 2022-03-26: 10 mg via INTRAVENOUS
  Filled 2022-03-26: qty 1

## 2022-03-26 MED ORDER — ENOXAPARIN SODIUM 40 MG/0.4ML IJ SOSY
40.0000 mg | PREFILLED_SYRINGE | INTRAMUSCULAR | Status: DC
  Administered 2022-03-26 – 2022-03-27 (×2): 40 mg via SUBCUTANEOUS
  Filled 2022-03-26 (×2): qty 0.4

## 2022-03-26 MED ORDER — VERAPAMIL HCL 2.5 MG/ML IV SOLN
INTRAVENOUS | Status: AC
  Filled 2022-03-26: qty 2

## 2022-03-26 MED ORDER — DIPHENHYDRAMINE HCL 50 MG/ML IJ SOLN
INTRAMUSCULAR | Status: AC
  Filled 2022-03-26: qty 1

## 2022-03-26 MED ORDER — MIDAZOLAM HCL 2 MG/2ML IJ SOLN
INTRAMUSCULAR | Status: AC
  Filled 2022-03-26: qty 2

## 2022-03-26 MED ORDER — INSULIN ASPART 100 UNIT/ML IJ SOLN
0.0000 [IU] | Freq: Three times a day (TID) | INTRAMUSCULAR | Status: DC
  Administered 2022-03-26: 8 [IU] via SUBCUTANEOUS
  Administered 2022-03-26: 11 [IU] via SUBCUTANEOUS
  Administered 2022-03-26: 15 [IU] via SUBCUTANEOUS

## 2022-03-26 SURGICAL SUPPLY — 20 items
BALLN EMERGE MR 2.0X12 (BALLOONS) ×2
BALLN ~~LOC~~ EMERGE MR 2.5X8 (BALLOONS) ×2
BALLOON EMERGE MR 2.0X12 (BALLOONS) IMPLANT
BALLOON ~~LOC~~ EMERGE MR 2.5X8 (BALLOONS) IMPLANT
BAND ZEPHYR COMPRESS 30 LONG (HEMOSTASIS) ×1 IMPLANT
CATH INFINITI JR4 5F (CATHETERS) ×1 IMPLANT
CATH OPTITORQUE TIG 4.0 5F (CATHETERS) ×1 IMPLANT
CATH VISTA GUIDE 6FR XB3 (CATHETERS) ×1 IMPLANT
GLIDESHEATH SLEND A-KIT 6F 22G (SHEATH) ×1 IMPLANT
GUIDEWIRE INQWIRE 1.5J.035X260 (WIRE) IMPLANT
INQWIRE 1.5J .035X260CM (WIRE) ×2
KIT ENCORE 26 ADVANTAGE (KITS) ×1 IMPLANT
KIT HEART LEFT (KITS) ×2 IMPLANT
PACK CARDIAC CATHETERIZATION (CUSTOM PROCEDURE TRAY) ×2 IMPLANT
STENT SYNERGY XD 2.25X12 (Permanent Stent) IMPLANT
SYNERGY XD 2.25X12 (Permanent Stent) ×4 IMPLANT
TRANSDUCER W/STOPCOCK (MISCELLANEOUS) ×2 IMPLANT
TUBING CIL FLEX 10 FLL-RA (TUBING) ×2 IMPLANT
WIRE ASAHI PROWATER 180CM (WIRE) ×2 IMPLANT
WIRE HI TORQ VERSACORE-J 145CM (WIRE) ×1 IMPLANT

## 2022-03-26 NOTE — ED Notes (Signed)
Ambulatory to restroom without assist.  

## 2022-03-26 NOTE — Assessment & Plan Note (Signed)
Start reduced dose Semglee NovoLog sliding scale 03/12/2022 hemoglobin A1c 7.1

## 2022-03-26 NOTE — Interval H&P Note (Signed)
Cath Lab Visit (complete for each Cath Lab visit)  Clinical Evaluation Leading to the Procedure:   ACS: Yes.    Non-ACS:    Anginal Classification: CCS II  Anti-ischemic medical therapy: Minimal Therapy (1 class of medications)  Non-Invasive Test Results: No non-invasive testing performed  Prior CABG: No previous CABG      History and Physical Interval Note:  03/26/2022 4:55 PM  [name]  has presented today for surgery, with the diagnosis of unstable angina.  The various methods of treatment have been discussed with the patient and family. After consideration of risks, benefits and other options for treatment, the patient has consented to  Procedure(s): LEFT HEART CATH AND CORONARY ANGIOGRAPHY (N/A) as a surgical intervention.  The patient's history has been reviewed, patient examined, no change in status, stable for surgery.  I have reviewed the patient's chart and labs.  Questions were answered to the patient's satisfaction.     Quay Burow

## 2022-03-26 NOTE — Assessment & Plan Note (Signed)
Appreciate cardiology consult Concerning for angina Echo troponins 11>>11 Discussed with [facility] cardiology>>transfer to [facility] for heart cath today Remain npo

## 2022-03-26 NOTE — Assessment & Plan Note (Signed)
Continue ropinirole 

## 2022-03-26 NOTE — Consult Note (Addendum)
Cardiology Consultation:   Patient ID: KEMAYA DORNER MRN: [id number]; DOB: [birth year]  Admit date: 03/25/2022 Date of Consult: 03/26/2022  PCP:  Karen Perking, FNP   [facility] Providers Cardiologist:  Minus Breeding, MD        Patient Profile:   [name] is a 71 y.o. female with a hx of CAD (s/p DES to LCx in 02/2007, BMS to LAD and Diagonal in 03/2007, cath in 02/2017 showing 50% stenosis along mid-LAD but not significant by FFR with patent stents in the mid-LAD and mid-LCx), HTN, HLD, Type 2 DM and carotid artery stenosis who is being seen 03/26/2022 for the evaluation of chest pain at the request of Dr. Josephine Cables.  History of Present Illness:   [name] was last examined by Dr. Percival Spanish in 04/2021 and denied any recent anginal symptoms. Was continued on her current cardiac medications including Amlodipine '10mg'$  daily, ASA '325mg'$  daily, Atenolol '50mg'$  BID, Crestor '40mg'$  daily and Imdur '60mg'$  daily.   She presented to [facility] ED on 03/25/2022 for evaluation of chest pain. She reports having chest pain starting the day prior to admission which felt like a pressure in the center of her chest. Did utilize SL NTG with temporary improvement in her pain. On the day of admission, her chest pain had progressed and was radiating into her back and shoulders with associated nausea which prompted her to seek evaluation. Reports her pain resembles her prior angina in the past when she required stent placement.   Initial labs show WBC 8.0, Hgb 11.9, platelets 189, Na+ 139, K+ 3.0 and creatinine 1.15 (close to baseline). Initial and repeat Hs Troponin negative at 11. CXR with no active cardiopulmonary disease. CTA showed no evidence of aortic dilatation or dissection. EKG shows NSR, HR 91 with 1st degree AV block with LAD.   Past Medical History:  Diagnosis Date   Anxiety    CAD (coronary artery disease)    DES to circumflex 02/2007, BMS to LAD and PTCA diagonal 03/2007   Carotid artery plaque     Mild   Cataract    Depression    Diverticulitis, colon    Elevated d-dimer 01/08/2014   Essential hypertension, benign    GERD (gastroesophageal reflux disease)    H/O hiatal hernia    HLD (hyperlipidemia)    IDDM (insulin dependent diabetes mellitus)    Migraine    "used to have them really bad; don't have them anymore" (01/07/2014)   MS (multiple sclerosis) ([place])    Not confirmed   PAT (paroxysmal atrial tachycardia) (HCC)    Prolapse of uterus    PVD (peripheral vascular disease) (HCC)    TIA (transient ischemic attack) 1980's    Past Surgical History:  Procedure Laterality Date   ABDOMINAL HYSTERECTOMY  1986   ovaries remain - prolaspe uterus    APPENDECTOMY  ~ 1970   BREAST BIOPSY Right 1980's   BREAST LUMPECTOMY Right 1980's   Dr. Charlynne Pander    CARDIAC CATHETERIZATION  01/07/2014   CHOLECYSTECTOMY  ?1987   COLONOSCOPY  2002   Dr. Anwar--> Severe diverticular changes in the region of the sigmoid and descending colon with scattered diverticular changes throughout the rest of the colon. No polyps, ulcerations. Despite numerous manipulations, the tip of the scope could not be tipped into the cecal area.   COLONOSCOPY  01/10/2012   Procedure: COLONOSCOPY;  Surgeon: Daneil Dolin, MD;  Location: [facility];  Service: Endoscopy;  Laterality: N/A;  1:55  CORONARY ANGIOPLASTY WITH STENT PLACEMENT  ~ 1997 X 2   "2 + 1"   EYE SURGERY Bilateral 2014   cataract   INTRAVASCULAR PRESSURE WIRE/FFR STUDY N/A 03/08/2017   Procedure: Intravascular Pressure Wire/FFR Study;  Surgeon: Nelva Bush, MD;  Location: [place] [facility];  Service: Cardiovascular;  Laterality: N/A;   LEFT HEART CATH AND CORONARY ANGIOGRAPHY N/A 03/08/2017   Procedure: Left Heart Cath and Coronary Angiography;  Surgeon: Nelva Bush, MD;  Location: [place] [facility];  Service: Cardiovascular;  Laterality: N/A;   LEFT HEART CATHETERIZATION WITH CORONARY ANGIOGRAM N/A 01/07/2014   Procedure: LEFT HEART  CATHETERIZATION WITH CORONARY ANGIOGRAM;  Surgeon: Larey Dresser, MD;  Location: [facility] CATH LAB;  Service: Cardiovascular;  Laterality: N/A;     Home Medications:  Prior to Admission medications   Medication Sig Start Date End Date Taking? Authorizing Provider  amLODipine (NORVASC) 10 MG tablet Take 1 tablet (10 mg total) by mouth daily. 10/12/20   Roxan Hockey, MD  aspirin 325 MG tablet Take 325 mg by mouth daily.    [provider]  atenolol (TENORMIN) 50 MG tablet Take 1 tablet (50 mg total) by mouth 2 (two) times daily. 04/10/21   Karen Perking, FNP  calcium carbonate (OS-CAL) 600 MG TABS tablet Take 1 tablet (600 mg total) by mouth 2 (two) times daily with a meal. 07/19/20   Karen Perking, FNP  citalopram (CELEXA) 40 MG tablet Take 40 mg by mouth daily.    [provider]  Continuous Blood Gluc Receiver (FREESTYLE LIBRE 2 READER) DEVI USE TO TEST BLOOD SUGAR 6 TIMES DAILY. Dx:E11.65 05/19/21   Karen Perking, FNP  Continuous Blood Gluc Sensor (FREESTYLE LIBRE 2 SENSOR) MISC USE TO TEST BLOOD SUGAR 6 TIMES DAILY 02/23/22   Karen Perking, FNP  furosemide (LASIX) 20 MG tablet TAKE ONE (1) TABLET EACH DAY 01/31/22   Karen Perking, FNP  hydrOXYzine (ATARAX/VISTARIL) 10 MG tablet Take 1 tablet (10 mg total) by mouth 3 (three) times daily as needed. 03/22/21   Karen Perking, FNP  insulin degludec (TRESIBA FLEXTOUCH) 100 UNIT/ML FlexTouch Pen Inject 60 Units into the skin daily.    [provider]  isosorbide mononitrate (IMDUR) 60 MG 24 hr tablet TAKE ONE (1) TABLET EACH DAY 03/02/21   Karen Perking, FNP  levocetirizine (XYZAL) 5 MG tablet Take 1 tablet (5 mg total) by mouth every evening. 01/24/22   Loman Brooklyn, FNP  meclizine (ANTIVERT) 25 MG tablet Take 1 tablet (25 mg total) by mouth 3 (three) times daily as needed for dizziness. 11/26/19   Dettinger, Fransisca Kaufmann, MD  meloxicam (MOBIC) 15 MG tablet Take 1 tablet (15 mg total) by mouth daily. Patient  not taking: Reported on 03/12/2022 12/27/21   Karen Perking, FNP  nitroGLYCERIN (NITROSTAT) 0.4 MG SL tablet DISSOLVE 1 TABLET UNDER TONGUE FOR CHESTPAIN.MAY REPEAT EVERY 5 MINUTES FOR 3 DOSES.IF NO RELIEF CALL 911 OR GO TO ER 06/08/20   Karen Perking, FNP  NOVOLOG FLEXPEN 100 UNIT/ML FlexPen INJECT 35-40 UNITS SQ 3 TIMES DAILY WITH MEALS Patient taking differently: 20-30 Units 3 (three) times daily with meals. Takes 8-30 units TID with meals depending on glucose 09/15/19   Dettinger, Fransisca Kaufmann, MD  ondansetron (ZOFRAN) 8 MG tablet Take 0.5 tablets (4 mg total) by mouth every 6 (six) hours as needed. 09/28/20   Karen Perking, FNP  potassium chloride (KLOR-CON M) 10 MEQ tablet Take 1 tablet (  10 mEq total) by mouth daily. Patient not taking: Reported on 03/12/2022 12/28/21   Karen Perking, FNP  rOPINIRole (REQUIP) 0.5 MG tablet Take 2 tablets (1 mg total) by mouth at bedtime. 01/09/21   Karen Perking, FNP  rosuvastatin (CRESTOR) 20 MG tablet Take 1 tablet (20 mg total) by mouth daily. 03/14/22   Karen Perking, FNP  Semaglutide, 1 MG/DOSE, (OZEMPIC, 1 MG/DOSE,) 2 MG/1.5ML SOPN Inject 1 mg into the skin once a week.    [provider]  sertraline (ZOLOFT) 50 MG tablet Take 1 tablet (50 mg total) by mouth daily. 11/25/20   Karen Perking, FNP  traZODone (DESYREL) 100 MG tablet Take 1 tablet (100 mg total) by mouth at bedtime as needed for sleep. 01/15/22   Karen Perking, FNP  triamcinolone ointment (KENALOG) 0.5 % Apply 1 application. topically 2 (two) times daily. 01/15/22   Karen Perking, FNP  vitamin B-12 (CYANOCOBALAMIN) 1000 MCG tablet Take 1,000 mcg by mouth daily. Patient not taking: Reported on 03/12/2022    [provider]  Vitamin D, Ergocalciferol, (DRISDOL) 1.25 MG (50000 UNIT) CAPS capsule TAKE 1 CAPSULE EVERY 7 DAYS Patient not taking: Reported on 03/12/2022 07/19/20   Karen Perking, FNP    Inpatient Medications: Scheduled Meds:  amLODipine  10 mg  Oral Daily   aspirin EC  81 mg Oral Daily   atenolol  50 mg Oral BID   enoxaparin (LOVENOX) injection  40 mg Subcutaneous Q24H   insulin aspart  0-15 Units Subcutaneous TID WC   insulin aspart  0-5 Units Subcutaneous QHS   insulin glargine-yfgn  10 Units Subcutaneous QHS   isosorbide mononitrate  60 mg Oral Daily   pantoprazole (PROTONIX) IV  40 mg Intravenous Q24H   potassium chloride  40 mEq Oral Once   rOPINIRole  1 mg Oral QHS   rosuvastatin  20 mg Oral Daily   sertraline  50 mg Oral Daily   Continuous Infusions:  sodium chloride     PRN Meds: acetaminophen **OR** acetaminophen, hydrALAZINE, ondansetron (ZOFRAN) IV  Allergies:    Allergies  Allergen Reactions   Iohexol      Desc: pt had syncopal episode with nausea post IV CM late 1990's,  pt has had prednisone prep with heart caths x 2 without problem  kdean 04/16/07, Onset Date: [id number]    Ticlid [Ticlopidine Hcl] Nausea And Vomiting   Jardiance [Empagliflozin] Other (See Comments)    Recurrent UTIs   Metformin And Related Diarrhea   Codeine Nausea And Vomiting and Palpitations    Social History:   Social History   Socioeconomic History   Marital status: Widowed    Spouse name: Not on file   Number of children: 4   Years of education: 13   Highest education level: 11th grade  Occupational History   Occupation: Disability    Employer: DISABLED  Tobacco Use   Smoking status: Never   Smokeless tobacco: Never   Tobacco comments:    spouse, 5 years - husband has quit 01/2011  Vaping Use   Vaping Use: Never used  Substance and Sexual Activity   Alcohol use: No   Drug use: No   Sexual activity: Not Currently  Other Topics Concern   Not on file  Social History Narrative   Lives alone, one level, handicap accessible bathroom, her children all live nearby   Social Determinants of Health   Financial Resource Strain: Low Risk  (01/22/2022)   Overall Financial  Resource Strain (CARDIA)    Difficulty of Paying  Living Expenses: Not very hard  Food Insecurity: No Food Insecurity (01/22/2022)   Hunger Vital Sign    Worried About Running Out of Food in the Last Year: Never true    Ran Out of Food in the Last Year: Never true  Transportation Needs: No Transportation Needs (01/22/2022)   PRAPARE - Hydrologist (Medical): No    Lack of Transportation (Non-Medical): No  Physical Activity: Insufficiently Active (01/22/2022)   Exercise Vital Sign    Days of Exercise per Week: 7 days    Minutes of Exercise per Session: 20 min  Stress: No Stress Concern Present (01/22/2022)   [place]    Feeling of Stress : Only a little  Social Connections: Moderately Integrated (01/22/2022)   Social Connection and Isolation Panel [NHANES]    Frequency of Communication with Friends and Family: More than three times a week    Frequency of Social Gatherings with Friends and Family: More than three times a week    Attends Religious Services: More than 4 times per year    Active Member of [employer] or Organizations: Yes    Attends Archivist Meetings: More than 4 times per year    Marital Status: Widowed  Intimate Partner Violence: Not At Risk (01/22/2022)   Humiliation, Afraid, Rape, and Kick questionnaire    Fear of Current or Ex-Partner: No    Emotionally Abused: No    Physically Abused: No    Sexually Abused: No    Family History:    Family History  Problem Relation Age of Onset   Heart attack Mother 47   Diabetes Mother    Hypertension Mother    Heart attack Father 68   Heart attack Brother 99       x 6   Heart disease Brother    Diabetes Brother    Colon cancer Paternal Aunt        27s, died with brain anuerysm   Crohn's disease Cousin        paternal   Diabetes Sister    GER disease Daughter    Cervical cancer Daughter    Diabetes Daughter      ROS:  Please see the history of present illness.   All  other ROS reviewed and negative.     Physical Exam/Data:   Vitals:   03/26/22 0300 03/26/22 0331 03/26/22 0500 03/26/22 0546  BP: (!) 195/73 (!) 172/75 (!) 166/59 (!) 188/72  Pulse:  91 93 98  Resp: '14 17 16 18  '$ Temp:   98.4 F (36.9 C)   TempSrc:   Oral   SpO2: 99% 97% 95% 97%  Weight:      Height:       No intake or output data in the 24 hours ending 03/26/22 0835    03/25/2022    7:19 PM 03/12/2022    8:05 AM 01/24/2022    8:14 AM  Last 3 Weights  Weight (lbs) 160 lb 161 lb 8 oz 156 lb 3.2 oz  Weight (kg) 72.576 kg 73.256 kg 70.852 kg     Body mass index is 25.82 kg/m.  General:  Well nourished, well developed, in no acute distress HEENT: normal Neck: no JVD Vascular: No carotid bruits; Distal pulses 2+ bilaterally Cardiac:  normal S1, S2; RRR; no murmur. Lungs:  clear to auscultation bilaterally, no wheezing, rhonchi or rales  Abd: soft, nontender, no hepatomegaly  Ext: no edema Musculoskeletal:  No deformities, BUE and BLE strength normal and equal Skin: warm and dry  Neuro:  CNs 2-12 intact, no focal abnormalities noted Psych:  Normal affect   EKG:  The EKG was personally reviewed and demonstrates: NSR, HR 91 with 1st degree AV block with LAD.  Telemetry:  Telemetry was personally reviewed and demonstrates: NSR with intermittent sinus tachycardia, HR in 90's to low-100's.   Relevant CV Studies:  LHC: 02/2017 Conclusions: Mild to moderate, nonobstructive coronary artery disease that has not changed significantly since 2015. Most significant stenosis is 50% in the mid LAD proximal to the previously placed stent. This is not hemodynamically significant by FFR (0.91). Patent stents in the mid LAD and mid LCx. Mildly elevated left ventricular filling pressure. Normal to hyperdynamic left ventricular systolic function.   Recommendations: Medical therapy and aggressive secondary prevention.    Echo: 08/2017 Study Conclusions   - Left ventricle: The cavity size  was normal. Wall thickness was    increased in a pattern of mild LVH. Systolic function was normal.    The estimated ejection fraction was in the range of 60% to 65%.    Wall motion was normal; there were no regional wall motion    abnormalities. Doppler parameters are consistent with abnormal    left ventricular relaxation (grade 1 diastolic dysfunction). The    E/e&' ratio is >15, suggesting elevated LV filling pressure.  - Mitral valve: Calcified annulus. Mildly thickened leaflets . Mild    stenosis. Pressure half-time: 79 ms. Mean gradient (D): 4 mm Hg.    Valve area by continuity equation (using LVOT flow): 1.41 cm^2.  - Left atrium: The atrium was normal in size.  - Inferior vena cava: The vessel was normal in size. The    respirophasic diameter changes were in the normal range (>= 50%),    consistent with normal central venous pressure.   Impressions:   - Compared to a prior study in 2016, the LVEF is lower at 60-65%.    Mild mitral stenosis with elevated LV filling pressure is again    noted.   Laboratory Data:  High Sensitivity Troponin:   Recent Labs  Lab 03/25/22 1932 03/25/22 2118  TROPONINIHS 11 11     Chemistry Recent Labs  Lab 03/25/22 1932 03/26/22 0625  NA 139 136  K 3.0* 3.6  CL 101 99  CO2 30 24  GLUCOSE 270* 395*  BUN 11 13  CREATININE 1.15* 1.00  CALCIUM 9.1 8.8*  MG  --  1.7  GFRNONAA 50* 59*  ANIONGAP 8 13    Recent Labs  Lab 03/26/22 0625  PROT 7.1  ALBUMIN 3.6  AST 19  ALT 15  ALKPHOS 52  BILITOT 1.3*   Lipids No results for input(s): "CHOL", "TRIG", "HDL", "LABVLDL", "LDLCALC", "CHOLHDL" in the last 168 hours.  Hematology Recent Labs  Lab 03/25/22 1932 03/26/22 0625  WBC 8.0 10.8*  RBC 3.81* 3.93  HGB 11.9* 12.3  HCT 33.7* 34.7*  MCV 88.5 88.3  MCH 31.2 31.3  MCHC 35.3 35.4  RDW 13.5 13.4  PLT 189 189   Thyroid No results for input(s): "TSH", "FREET4" in the last 168 hours.  BNPNo results for input(s): "BNP", "PROBNP"  in the last 168 hours.  DDimer No results for input(s): "DDIMER" in the last 168 hours.   Radiology/Studies:  CT Angio Chest/Abd/Pel for Dissection W and/or W/WO  Result Date: 03/26/2022 CLINICAL DATA:  Chest  pain and shortness of breath EXAM: CT ANGIOGRAPHY CHEST, ABDOMEN AND PELVIS TECHNIQUE: Non-contrast CT of the chest was initially obtained. Multidetector CT imaging through the chest, abdomen and pelvis was performed using the standard protocol during bolus administration of intravenous contrast. Multiplanar reconstructed images and MIPs were obtained and reviewed to evaluate the vascular anatomy. RADIATION DOSE REDUCTION: This exam was performed according to the departmental dose-optimization program which includes automated exposure control, adjustment of the mA and/or kV according to patient size and/or use of iterative reconstruction technique. CONTRAST:  11m OMNIPAQUE IOHEXOL 350 MG/ML SOLN. 4 hour premedication was administered due to prior contrast allergy. COMPARISON:  Chest x-ray from the previous day. FINDINGS: CTA CHEST FINDINGS Cardiovascular: Precontrast images demonstrate atherosclerotic calcifications of the aorta. No hyperdense crescent to suggest acute aortic abnormality is noted. Post-contrast images show atherosclerotic changes without aneurysmal dilatation or dissection. No cardiac enlargement is seen. Coronary calcifications are noted. Pulmonary artery as visualized shows no pulmonary emboli. Mediastinum/Nodes: Thoracic inlet is within normal limits. No hilar or mediastinal adenopathy is noted. The esophagus is within normal limits. Lungs/Pleura: Lungs are well aerated bilaterally. No focal infiltrate or sizable effusion is seen. Musculoskeletal: Degenerative changes of the thoracic spine are noted. No acute rib abnormality is seen. Review of the MIP images confirms the above findings. CTA ABDOMEN AND PELVIS FINDINGS VASCULAR Aorta: Atherosclerotic calcifications are noted without  aneurysmal dilatation or dissection. Celiac: Patent without evidence of aneurysm, dissection, vasculitis or significant stenosis. SMA: Patent without evidence of aneurysm, dissection, vasculitis or significant stenosis. Renals: Both renal arteries are patent without evidence of aneurysm, dissection, vasculitis, fibromuscular dysplasia or significant stenosis. IMA: Patent without evidence of aneurysm, dissection, vasculitis or significant stenosis. Inflow: Iliacs demonstrate atherosclerotic calcifications. No aneurysmal dilatation or dissection is noted. Veins: No specific venous abnormality is noted. Review of the MIP images confirms the above findings. NON-VASCULAR Hepatobiliary: Gallbladder has been surgically removed. Fatty infiltration of the liver is noted. Pancreas: Unremarkable. No pancreatic ductal dilatation or surrounding inflammatory changes. Spleen: Normal in size without focal abnormality. Adrenals/Urinary Tract: Adrenal glands are within normal limits. Kidneys demonstrate a normal enhancement pattern bilaterally. No renal calculi or obstructive changes are seen. The ureters are within normal limits. The bladder is well distended. Stomach/Bowel: Scattered diverticular change of the colon is noted. No diverticulitis is seen. The appendix has been surgically removed. Small bowel and stomach are within normal limits. Lymphatic: No sizable lymphadenopathy is noted. Reproductive: Status post hysterectomy. No adnexal masses. Other: No abdominal wall hernia or abnormality. No abdominopelvic ascites. Musculoskeletal: Degenerative changes of lumbar spine are seen. Review of the MIP images confirms the above findings. IMPRESSION: CTA of the chest: No aortic aneurysmal dilatation or dissection is seen. No pulmonary emboli are noted. CTA of the abdomen and pelvis: No acute arterial abnormality is noted. Fatty liver. Diverticulosis without diverticulitis. Electronically Signed   By: MInez CatalinaM.D.   On:  03/26/2022 03:23   DG Chest Port 1 View  Result Date: 03/25/2022 CLINICAL DATA:  Chest pain EXAM: PORTABLE CHEST 1 VIEW COMPARISON:  11/15/2020 FINDINGS: Stable cardiomediastinal contours. Aortic atherosclerosis. No focal airspace consolidation, pleural effusion, or pneumothorax. IMPRESSION: No active disease. Electronically Signed   By: NDavina PokeD.O.   On: 03/25/2022 20:09     Assessment and Plan:   1. Chest Pain concerning for Accelerating Angina - She presents with worsening chest discomfort over the past few days with associated nausea and dyspnea which resembles her prior angina. - Troponin values have been  negative and EKG is without acute ST changes. - She has been evaluated by Dr. Johnsie Cancel who recommends cardiac catheterization for definitive evaluation. The patient understands that risks include but are not limited to stroke (1 in 1000), death (1 in 85), kidney failure [usually temporary] (1 in 500), bleeding (1 in 200), allergic reaction [possibly serious] (1 in 200). Will add to the cath board for later today. She does have a contrast allergy and will order pre-medication today.  -Continue current medical therapy with ASA 81 mg daily, Amlodipine 10 mg daily, Atenolol 50 mg twice daily, Imdur 60 mg daily and Crestor 20 mg daily.  2. CAD - She is s/p DES to LCx in 02/2007, BMS to LAD and Diagonal in 03/2007 and cath in 02/2017 showed 50% stenosis along mid-LAD but not significant by FFR with patent stents in the mid-LAD and mid-LCx. - Will plan for repeat ischemic evaluation as outlined above. Continue ASA, beta-blocker, Imdur and statin therapy.  3. HTN - BP has been elevated, at 188/72 on most recent check. She is scheduled to receive Amlodipine and Atenolol this morning. If BP remains above goal, would add an ARB.  4. HLD - Will recheck an FLP. She remains on Crestor '20mg'$  daily.   5. Type 2 DM - Glucose remains poorly controlled, in the 300's this AM. Hgb A1c pending.  Management per the admitting team.     Risk Assessment/Risk Scores:     TIMI Risk Score for Unstable Angina or Non-ST Elevation MI:   The patient's TIMI risk score is 5, which indicates a 26% risk of all cause mortality, new or recurrent myocardial infarction or need for urgent revascularization in the next 14 days.   For questions or updates, please contact [place] Please consult [email or web address] for contact info under    Signed, Erma Heritage, PA-C  03/26/2022 8:35 AM   Patient examined chart reviewed Exam benign no murmur/bruit lungs clear ECG non acute troponin negative Last cath with stents to LAD/RCA and 50% stenosis prior to stent in LAD. Pain similar to previous angina relieved with nitro Favor transfer to [facility] for heart cath Has contrast allergy and has done well with premedication before Good right radial pulse Risks including stroke, bleeding, intubation and emergency surgery discussed willing to proceed Needs better BS control Hold glucophage prior to cath  Jenkins Rouge MD [facility]

## 2022-03-26 NOTE — Assessment & Plan Note (Signed)
Continue statin Check lipid panel

## 2022-03-26 NOTE — Progress Notes (Signed)
Received to Cath lab Holding Area from [facility] by [employer]. Alert and oriented. Skin warm and dry, pink. Placed on O2 2L [place] d/t having chest heaviness. Respirations regular nonlabored.

## 2022-03-26 NOTE — Assessment & Plan Note (Signed)
Continue Zoloft 

## 2022-03-26 NOTE — Progress Notes (Signed)
  I asked [facility] to manage pt's diabetes here in hospital and they were agreeable.  [name], FNP-C [facility] [place]  [phone] or after 5pm and on weekends call [phone] 03/26/2022.now

## 2022-03-26 NOTE — Assessment & Plan Note (Addendum)
Check UA/culture as she has intermittent dysuria Start protonix Start IVF

## 2022-03-26 NOTE — Hospital Course (Signed)
71 year old female with a history of coronary artery disease with DES and BMS in 2008 presenting with substernal chest pressure that was radiating to her shoulder blades that began on 03/23/2022.  The patient also has a history of hypertension, hyperlipidemia, diabetes mellitus type 2, atrial tachycardia, and TIA.  She states that this started when she was just sitting down on the couch watching television.  It has been constant since then without any exacerbating factors.  The patient did take 2 sublingual nitroglycerin with some improvement but not complete resolution of her chest pressure.  She states that her shortness of breath has been the same as usual.  The patient did experience nausea and vomiting.  She states that she has had 5 episodes without any blood.  She denies any recent travels, eating unusual foods, or sick contacts.  She denies any fevers, chills, headache, neck pain, abdominal pain, dysuria, hematuria.  In the ED, the patient was afebrile and hemodynamically stable with oxygen saturation 97% on room air.  She was hypertensive with blood pressure up to 192/81.  WBC 8.0, hemoglobin 11.9, platelets 189,000.  BMP showed sodium 139, potassium 3.0, bicarbonate 30, serum creatinine 1.5.  Troponin 11>> 11.  Chest x-ray was negative for any acute findings.  CTA chest was negative for pulmonary embolus or aortic aneurysmal dilatation or dissection.  Cardiology was consulted to assist with management.

## 2022-03-26 NOTE — Assessment & Plan Note (Signed)
Continue Imdur Appreciate cardiology consult>> planning for heart catheterization 04/22/2022 History of BMS/DES in 2008

## 2022-03-26 NOTE — Assessment & Plan Note (Signed)
Continue amlodipine, atenolol

## 2022-03-26 NOTE — ED Notes (Signed)
Pt is alert oriented and ambulatory. Able to perform ADLs independently.

## 2022-03-26 NOTE — ED Notes (Signed)
Hospitalist in room.

## 2022-03-26 NOTE — TOC Transition Note (Addendum)
  Transition of Care [facility]) Screening Note   Patient Details  Name: [name] Date of Birth: [birth year]   Transition of Care [facility]) CM/SW Contact:    Shade Flood, LCSW Phone Number: 03/26/2022, 11:05 AM   Per MD, pt will be transferring to [facility] for heart cath. TOC at [facility] will assist if needs arise.  Transition of Care Department [facility]) has reviewed patient and no TOC needs have been identified at this time. We will continue to monitor patient advancement through interdisciplinary progression rounds. If new patient transition needs arise, please place a TOC consult.

## 2022-03-26 NOTE — Progress Notes (Signed)
HOSPITAL MEDICINE OVERNIGHT EVENT NOTE    Notified by nursing that patient has been quite hyperglycemic since patient's cardiac catheterization earlier in the day.  Blood sugar this evening is markedly elevated at 483.  According to the patient, she typically takes 30 units of long-acting insulin nightly at home.  We will therefore increase Semglee dosing here from 10 units to 30 units nightly.  We will pair this with 16 units of NovoLog x1 for this elevated blood sugar of 483 this evening.  Finally, we will obtain a 2 AM blood sugar to ensure the patient is not exhibiting any early morning hypoglycemia.  Insulin regimen will be titrated aggressively as necessary to achieve excellent glycemic control.  Vernelle Emerald  MD [facility]   ADDENDUM (7/4 2:50pm)  2AM blood sugar is 365.  Giving an additional 10 units of Novolog now.  Sherryll Burger Santosh Petter

## 2022-03-26 NOTE — H&P (Signed)
History and Physical    Patient: [name] VHQ:[id number] DOB: 16-Dec-1950 DOA: 03/25/2022 DOS: the patient was seen and examined on 03/26/2022 PCP: Gwenlyn Perking, FNP  Patient coming from: Home  Chief Complaint:  Chief Complaint  Patient presents with   Chest Pain   HPI: [name] is a 71 y.o. female with medical history significant of hypertension, hyperlipidemia, GERD, T2DM, CAD, depression who presents to the emergency department due to chest pain which started on Saturday (7/1).  Chest pain was pressure-like, it was midsternal to left-sided and was nonreproducible, it radiates to left arm with sensation of numbness, it also radiates to the back in between the shoulder blades.  It was associated with nausea and nonbloody vomiting x2.  She took 2 nitro pills with improvement in chest pressure.  Patient states that chest discomfort was similar to when she had a previous heart attack.  She denies fever, chills, headache, blurry vision, abdominal pain   ED Course:  In the emergency department, BP was elevated at 188/75, other vital signs were within normal range.  Work-up in the ED showed normocytic anemia, BMP was normal except for hypokalemia and hyperglycemia, creatinine 1.15, troponin x2 was flat at 11. Chest x-ray showed no active disease CT angiography chest, abdomen and pelvis showed no aortic aneurysmal dilatation or dissection is seen.  No pulmonary emboli are noted.  No acute arterial abnormality is noted in the abdomen. She was treated with Solu-Medrol and Benadryl.  Hospitalist was asked to admit patient for further evaluation and management  Review of Systems: Review of systems as noted in the HPI. All other systems reviewed and are negative.   Past Medical History:  Diagnosis Date   Anxiety    CAD (coronary artery disease)    DES to circumflex 02/2007, BMS to LAD and PTCA diagonal 03/2007   Carotid artery plaque    Mild   Cataract    Depression    Diverticulitis,  colon    Elevated d-dimer 01/08/2014   Essential hypertension, benign    GERD (gastroesophageal reflux disease)    H/O hiatal hernia    HLD (hyperlipidemia)    IDDM (insulin dependent diabetes mellitus)    Migraine    "used to have them really bad; don't have them anymore" (01/07/2014)   MS (multiple sclerosis) ([place])    Not confirmed   PAT (paroxysmal atrial tachycardia) (HCC)    Prolapse of uterus    PVD (peripheral vascular disease) (HCC)    TIA (transient ischemic attack) 1980's   Past Surgical History:  Procedure Laterality Date   ABDOMINAL HYSTERECTOMY  1986   ovaries remain - prolaspe uterus    APPENDECTOMY  ~ 1970   BREAST BIOPSY Right 1980's   BREAST LUMPECTOMY Right 1980's   Dr. Charlynne Pander    CARDIAC CATHETERIZATION  01/07/2014   CHOLECYSTECTOMY  ?1987   COLONOSCOPY  2002   Dr. Anwar--> Severe diverticular changes in the region of the sigmoid and descending colon with scattered diverticular changes throughout the rest of the colon. No polyps, ulcerations. Despite numerous manipulations, the tip of the scope could not be tipped into the cecal area.   COLONOSCOPY  01/10/2012   Procedure: COLONOSCOPY;  Surgeon: Daneil Dolin, MD;  Location: [facility];  Service: Endoscopy;  Laterality: N/A;  1:55   CORONARY ANGIOPLASTY WITH STENT PLACEMENT  ~ 1997 X 2   "2 + 1"   EYE SURGERY Bilateral 2014   cataract   INTRAVASCULAR PRESSURE  WIRE/FFR STUDY N/A 03/08/2017   Procedure: Intravascular Pressure Wire/FFR Study;  Surgeon: Nelva Bush, MD;  Location: [place] [facility];  Service: Cardiovascular;  Laterality: N/A;   LEFT HEART CATH AND CORONARY ANGIOGRAPHY N/A 03/08/2017   Procedure: Left Heart Cath and Coronary Angiography;  Surgeon: Nelva Bush, MD;  Location: [place] [facility];  Service: Cardiovascular;  Laterality: N/A;   LEFT HEART CATHETERIZATION WITH CORONARY ANGIOGRAM N/A 01/07/2014   Procedure: LEFT HEART CATHETERIZATION WITH CORONARY ANGIOGRAM;  Surgeon: Larey Dresser, MD;  Location: [facility] CATH LAB;  Service: Cardiovascular;  Laterality: N/A;    Social History:  reports that she has never smoked. She has never used smokeless tobacco. She reports that she does not drink alcohol and does not use drugs.   Allergies  Allergen Reactions   Iohexol      Desc: pt had syncopal episode with nausea post IV CM late 1990's,  pt has had prednisone prep with heart caths x 2 without problem  kdean 04/16/07, Onset Date: [id number]    Ticlid [Ticlopidine Hcl] Nausea And Vomiting   Jardiance [Empagliflozin] Other (See Comments)    Recurrent UTIs   Metformin And Related Diarrhea   Codeine Nausea And Vomiting and Palpitations    Family History  Problem Relation Age of Onset   Heart attack Mother 78   Diabetes Mother    Hypertension Mother    Heart attack Father 80   Heart attack Brother 45       x 6   Heart disease Brother    Diabetes Brother    Colon cancer Paternal Aunt        41s, died with brain anuerysm   Crohn's disease Cousin        paternal   Diabetes Sister    GER disease Daughter    Cervical cancer Daughter    Diabetes Daughter      Prior to Admission medications   Medication Sig Start Date End Date Taking? Authorizing Provider  amLODipine (NORVASC) 10 MG tablet Take 1 tablet (10 mg total) by mouth daily. 10/12/20   Roxan Hockey, MD  aspirin 325 MG tablet Take 325 mg by mouth daily.    [provider]  atenolol (TENORMIN) 50 MG tablet Take 1 tablet (50 mg total) by mouth 2 (two) times daily. 04/10/21   Gwenlyn Perking, FNP  calcium carbonate (OS-CAL) 600 MG TABS tablet Take 1 tablet (600 mg total) by mouth 2 (two) times daily with a meal. 07/19/20   Gwenlyn Perking, FNP  citalopram (CELEXA) 40 MG tablet Take 40 mg by mouth daily.    [provider]  Continuous Blood Gluc Receiver (FREESTYLE LIBRE 2 READER) DEVI USE TO TEST BLOOD SUGAR 6 TIMES DAILY. Dx:E11.65 05/19/21   Gwenlyn Perking, FNP  Continuous Blood Gluc Sensor  (FREESTYLE LIBRE 2 SENSOR) MISC USE TO TEST BLOOD SUGAR 6 TIMES DAILY 02/23/22   Gwenlyn Perking, FNP  furosemide (LASIX) 20 MG tablet TAKE ONE (1) TABLET EACH DAY 01/31/22   Gwenlyn Perking, FNP  hydrOXYzine (ATARAX/VISTARIL) 10 MG tablet Take 1 tablet (10 mg total) by mouth 3 (three) times daily as needed. 03/22/21   Gwenlyn Perking, FNP  insulin degludec (TRESIBA FLEXTOUCH) 100 UNIT/ML FlexTouch Pen Inject 60 Units into the skin daily.    [provider]  isosorbide mononitrate (IMDUR) 60 MG 24 hr tablet TAKE ONE (1) TABLET EACH DAY 03/02/21   Gwenlyn Perking, FNP  levocetirizine (XYZAL) 5 MG  tablet Take 1 tablet (5 mg total) by mouth every evening. 01/24/22   Loman Brooklyn, FNP  meclizine (ANTIVERT) 25 MG tablet Take 1 tablet (25 mg total) by mouth 3 (three) times daily as needed for dizziness. 11/26/19   Dettinger, Fransisca Kaufmann, MD  meloxicam (MOBIC) 15 MG tablet Take 1 tablet (15 mg total) by mouth daily. Patient not taking: Reported on 03/12/2022 12/27/21   Gwenlyn Perking, FNP  nitroGLYCERIN (NITROSTAT) 0.4 MG SL tablet DISSOLVE 1 TABLET UNDER TONGUE FOR CHESTPAIN.MAY REPEAT EVERY 5 MINUTES FOR 3 DOSES.IF NO RELIEF CALL 911 OR GO TO ER 06/08/20   Gwenlyn Perking, FNP  NOVOLOG FLEXPEN 100 UNIT/ML FlexPen INJECT 35-40 UNITS SQ 3 TIMES DAILY WITH MEALS Patient taking differently: 20-30 Units 3 (three) times daily with meals. Takes 8-30 units TID with meals depending on glucose 09/15/19   Dettinger, Fransisca Kaufmann, MD  ondansetron (ZOFRAN) 8 MG tablet Take 0.5 tablets (4 mg total) by mouth every 6 (six) hours as needed. 09/28/20   Gwenlyn Perking, FNP  potassium chloride (KLOR-CON M) 10 MEQ tablet Take 1 tablet (10 mEq total) by mouth daily. Patient not taking: Reported on 03/12/2022 12/28/21   Gwenlyn Perking, FNP  rOPINIRole (REQUIP) 0.5 MG tablet Take 2 tablets (1 mg total) by mouth at bedtime. 01/09/21   Gwenlyn Perking, FNP  rosuvastatin (CRESTOR) 20 MG tablet Take 1 tablet (20 mg total) by  mouth daily. 03/14/22   Gwenlyn Perking, FNP  Semaglutide, 1 MG/DOSE, (OZEMPIC, 1 MG/DOSE,) 2 MG/1.5ML SOPN Inject 1 mg into the skin once a week.    [provider]  sertraline (ZOLOFT) 50 MG tablet Take 1 tablet (50 mg total) by mouth daily. 11/25/20   Gwenlyn Perking, FNP  traZODone (DESYREL) 100 MG tablet Take 1 tablet (100 mg total) by mouth at bedtime as needed for sleep. 01/15/22   Gwenlyn Perking, FNP  triamcinolone ointment (KENALOG) 0.5 % Apply 1 application. topically 2 (two) times daily. 01/15/22   Gwenlyn Perking, FNP  vitamin B-12 (CYANOCOBALAMIN) 1000 MCG tablet Take 1,000 mcg by mouth daily. Patient not taking: Reported on 03/12/2022    [provider]  Vitamin D, Ergocalciferol, (DRISDOL) 1.25 MG (50000 UNIT) CAPS capsule TAKE 1 CAPSULE EVERY 7 DAYS Patient not taking: Reported on 03/12/2022 07/19/20   Gwenlyn Perking, FNP    Physical Exam: BP (!) 188/72 (BP Location: Left Arm)   Pulse 98   Temp 98.4 F (36.9 C) (Oral)   Resp 18   Ht '5\' 6"'$  (1.676 m)   Wt 72.6 kg   SpO2 97%   BMI 25.82 kg/m   General: 71 y.o. year-old female well developed well nourished in no acute distress.  Alert and oriented x3. HEENT: NCAT, EOMI Neck: Supple, trachea medial Cardiovascular: Regular rate and rhythm with no rubs or gallops.  No thyromegaly or JVD noted.  No lower extremity edema. 2/4 pulses in all 4 extremities. Respiratory: Clear to auscultation with no wheezes or rales. Good inspiratory effort. Abdomen: Soft, nontender nondistended with normal bowel sounds x4 quadrants. Muskuloskeletal: No cyanosis, clubbing or edema noted bilaterally Neuro: CN II-XII intact, strength 5/5 x 4, sensation, reflexes intact Skin: No ulcerative lesions noted or rashes Psychiatry: Judgement and insight appear normal. Mood is appropriate for condition and setting          Labs on Admission:  Basic Metabolic Panel: Recent Labs  Lab 03/25/22 1932 03/26/22 0625  NA 139 136  K  3.0* 3.6  CL 101 99  CO2 30 24  GLUCOSE 270* 395*  BUN 11 13  CREATININE 1.15* 1.00  CALCIUM 9.1 8.8*  MG  --  1.7  PHOS  --  3.7   Liver Function Tests: Recent Labs  Lab 03/26/22 0625  AST 19  ALT 15  ALKPHOS 52  BILITOT 1.3*  PROT 7.1  ALBUMIN 3.6   No results for input(s): "LIPASE", "AMYLASE" in the last 168 hours. No results for input(s): "AMMONIA" in the last 168 hours. CBC: Recent Labs  Lab 03/25/22 1932 03/26/22 0625  WBC 8.0 10.8*  HGB 11.9* 12.3  HCT 33.7* 34.7*  MCV 88.5 88.3  PLT 189 189   Cardiac Enzymes: No results for input(s): "CKTOTAL", "CKMB", "CKMBINDEX", "TROPONINI" in the last 168 hours.  BNP (last 3 results) No results for input(s): "BNP" in the last 8760 hours.  ProBNP (last 3 results) No results for input(s): "PROBNP" in the last 8760 hours.  CBG: No results for input(s): "GLUCAP" in the last 168 hours.  Radiological Exams on Admission: CT Angio Chest/Abd/Pel for Dissection W and/or W/WO  Result Date: 03/26/2022 CLINICAL DATA:  Chest pain and shortness of breath EXAM: CT ANGIOGRAPHY CHEST, ABDOMEN AND PELVIS TECHNIQUE: Non-contrast CT of the chest was initially obtained. Multidetector CT imaging through the chest, abdomen and pelvis was performed using the standard protocol during bolus administration of intravenous contrast. Multiplanar reconstructed images and MIPs were obtained and reviewed to evaluate the vascular anatomy. RADIATION DOSE REDUCTION: This exam was performed according to the departmental dose-optimization program which includes automated exposure control, adjustment of the mA and/or kV according to patient size and/or use of iterative reconstruction technique. CONTRAST:  136m OMNIPAQUE IOHEXOL 350 MG/ML SOLN. 4 hour premedication was administered due to prior contrast allergy. COMPARISON:  Chest x-ray from the previous day. FINDINGS: CTA CHEST FINDINGS Cardiovascular: Precontrast images demonstrate atherosclerotic  calcifications of the aorta. No hyperdense crescent to suggest acute aortic abnormality is noted. Post-contrast images show atherosclerotic changes without aneurysmal dilatation or dissection. No cardiac enlargement is seen. Coronary calcifications are noted. Pulmonary artery as visualized shows no pulmonary emboli. Mediastinum/Nodes: Thoracic inlet is within normal limits. No hilar or mediastinal adenopathy is noted. The esophagus is within normal limits. Lungs/Pleura: Lungs are well aerated bilaterally. No focal infiltrate or sizable effusion is seen. Musculoskeletal: Degenerative changes of the thoracic spine are noted. No acute rib abnormality is seen. Review of the MIP images confirms the above findings. CTA ABDOMEN AND PELVIS FINDINGS VASCULAR Aorta: Atherosclerotic calcifications are noted without aneurysmal dilatation or dissection. Celiac: Patent without evidence of aneurysm, dissection, vasculitis or significant stenosis. SMA: Patent without evidence of aneurysm, dissection, vasculitis or significant stenosis. Renals: Both renal arteries are patent without evidence of aneurysm, dissection, vasculitis, fibromuscular dysplasia or significant stenosis. IMA: Patent without evidence of aneurysm, dissection, vasculitis or significant stenosis. Inflow: Iliacs demonstrate atherosclerotic calcifications. No aneurysmal dilatation or dissection is noted. Veins: No specific venous abnormality is noted. Review of the MIP images confirms the above findings. NON-VASCULAR Hepatobiliary: Gallbladder has been surgically removed. Fatty infiltration of the liver is noted. Pancreas: Unremarkable. No pancreatic ductal dilatation or surrounding inflammatory changes. Spleen: Normal in size without focal abnormality. Adrenals/Urinary Tract: Adrenal glands are within normal limits. Kidneys demonstrate a normal enhancement pattern bilaterally. No renal calculi or obstructive changes are seen. The ureters are within normal limits.  The bladder is well distended. Stomach/Bowel: Scattered diverticular change of the colon is noted. No diverticulitis is seen. The appendix has  been surgically removed. Small bowel and stomach are within normal limits. Lymphatic: No sizable lymphadenopathy is noted. Reproductive: Status post hysterectomy. No adnexal masses. Other: No abdominal wall hernia or abnormality. No abdominopelvic ascites. Musculoskeletal: Degenerative changes of lumbar spine are seen. Review of the MIP images confirms the above findings. IMPRESSION: CTA of the chest: No aortic aneurysmal dilatation or dissection is seen. No pulmonary emboli are noted. CTA of the abdomen and pelvis: No acute arterial abnormality is noted. Fatty liver. Diverticulosis without diverticulitis. Electronically Signed   By: Inez Catalina M.D.   On: 03/26/2022 03:23   DG Chest Port 1 View  Result Date: 03/25/2022 CLINICAL DATA:  Chest pain EXAM: PORTABLE CHEST 1 VIEW COMPARISON:  11/15/2020 FINDINGS: Stable cardiomediastinal contours. Aortic atherosclerosis. No focal airspace consolidation, pleural effusion, or pneumothorax. IMPRESSION: No active disease. Electronically Signed   By: Davina Poke D.O.   On: 03/25/2022 20:09    EKG: I independently viewed the EKG done and my findings are as followed: Normal sinus rhythm at a rate of 91 bpm  Assessment/Plan Present on Admission:  Chest pain  Active Problems:   Mixed hyperlipidemia   CAD (coronary artery disease)   Essential hypertension   Hypokalemia   Uncontrolled type 2 diabetes mellitus with hyperglycemia, with long-term current use of insulin (HCC)   Depression   Restless leg syndrome   Chest pain  Chest pain rule out ACS Cardiovascular risk factors include hypertension, hyperlipidemia, type 2 diabetes mellitus Continue telemetry  Troponins x2 was flat at 11 EKG showed normal sinus rhythm at rate of 91 bpm Cardiology will be consulted to help decide if Stress test is needed in am Versus  other  diagnostic modalities.    Give aspirin, nitroglycerin prn  Hypokalemia K+ 3.0, this was replenished  Type 2 diabetes mellitus with hyperglycemia Continue Semglee 10 units and adjust dose accordingly Continue ISS and hypoglycemia protocol  Essential hypertension Continue amlodipine, atenolol, Imdur  Mixed hyperlipidemia Continue Crestor  CAD Continue aspirin, Crestor  Depression Continue Zoloft  Restless leg syndrome Continue ropinirole  DVT prophylaxis: Lovenox  Code Status: Full code   Consults: Cardiology  Family Communication: None at bedside  Severity of Illness: The appropriate patient status for this patient is OBSERVATION. Observation status is judged to be reasonable and necessary in order to provide the required intensity of service to ensure the patient's safety. The patient's presenting symptoms, physical exam findings, and initial radiographic and laboratory data in the context of their medical condition is felt to place them at decreased risk for further clinical deterioration. Furthermore, it is anticipated that the patient will be medically stable for discharge from the hospital within 2 midnights of admission.   Author: Bernadette Hoit, DO 03/26/2022 7:23 AM  For on call review [email or web address].

## 2022-03-26 NOTE — Progress Notes (Addendum)
PROGRESS NOTE  [name] LOV:[id number] DOB: Oct 11, 1950 DOA: 03/25/2022 PCP: Gwenlyn Perking, FNP  Brief History:  71 year old female with a history of coronary artery disease with DES and BMS in 2008 presenting with substernal chest pressure that was radiating to her shoulder blades that began on 03/23/2022.  The patient also has a history of hypertension, hyperlipidemia, diabetes mellitus type 2, atrial tachycardia, and TIA.  She states that this started when she was just sitting down on the couch watching television.  It has been constant since then without any exacerbating factors.  The patient did take 2 sublingual nitroglycerin with some improvement but not complete resolution of her chest pressure.  She states that her shortness of breath has been the same as usual.  The patient did experience nausea and vomiting.  She states that she has had 5 episodes without any blood.  She denies any recent travels, eating unusual foods, or sick contacts.  She denies any fevers, chills, headache, neck pain, abdominal pain, dysuria, hematuria.  In the ED, the patient was afebrile and hemodynamically stable with oxygen saturation 97% on room air.  She was hypertensive with blood pressure up to 192/81.  WBC 8.0, hemoglobin 11.9, platelets 189,000.  BMP showed sodium 139, potassium 3.0, bicarbonate 30, serum creatinine 1.5.  Troponin 11>> 11.  Chest x-ray was negative for any acute findings.  CTA chest was negative for pulmonary embolus or aortic aneurysmal dilatation or dissection.  Cardiology was consulted to assist with management.     Assessment and Plan: * Chest pain Appreciate cardiology consult Concerning for angina Echo troponins 11>>11 Discussed with [facility] cardiology>>transfer to [facility] for heart cath today Remain npo  CAD (coronary artery disease) Continue Imdur Appreciate cardiology consult>> planning for heart catheterization 04/22/2022 History of BMS/DES in 2008  Uncontrolled type 2  diabetes mellitus with hyperglycemia, with long-term current use of insulin ([place]) Start reduced dose Semglee NovoLog sliding scale 03/12/2022 hemoglobin A1c 7.1  Essential hypertension Continue amlodipine, atenolol  Nausea and vomiting Check UA/culture as she has intermittent dysuria Start protonix Start IVF  Restless leg syndrome Continue ropinirole  Depression Continue Zoloft  Hypokalemia Replete Check magnesium  Mixed hyperlipidemia Continue statin Check lipid panel         Family Communication:  no  Family at bedside  Consultants:  cardiology  Code Status:  FULL   DVT Prophylaxis:  [place] Lovenox   Procedures: As Listed in Progress Note Above  Antibiotics: None        Subjective: Patient continues to have chest pressure.  She states that she has had some nausea and vomiting.  There is no hematemesis or diarrhea or abdominal pain.  She denies any shortness of breath, fever, chills.  She has had some intermittent dysuria.  Objective: Vitals:   03/26/22 0300 03/26/22 0331 03/26/22 0500 03/26/22 0546  BP: (!) 195/73 (!) 172/75 (!) 166/59 (!) 188/72  Pulse:  91 93 98  Resp: '14 17 16 18  '$ Temp:   98.4 F (36.9 C)   TempSrc:   Oral   SpO2: 99% 97% 95% 97%  Weight:      Height:       No intake or output data in the 24 hours ending 03/26/22 0836 Weight change:  Exam:  General:  Pt is alert, follows commands appropriately, not in acute distress HEENT: No icterus, No thrush, No neck mass, [place]/AT Cardiovascular: RRR, S1/S2, no rubs, no gallops Respiratory: CTA  bilaterally, no wheezing, no crackles, no rhonchi Abdomen: Soft/+BS, non tender, non distended, no guarding Extremities: No edema, No lymphangitis, No petechiae, No rashes, no synovitis   Data Reviewed: I have personally reviewed following labs and imaging studies Basic Metabolic Panel: Recent Labs  Lab 03/25/22 1932 03/26/22 0625  NA 139 136  K 3.0* 3.6  CL 101 99  CO2 30 24   GLUCOSE 270* 395*  BUN 11 13  CREATININE 1.15* 1.00  CALCIUM 9.1 8.8*  MG  --  1.7  PHOS  --  3.7   Liver Function Tests: Recent Labs  Lab 03/26/22 0625  AST 19  ALT 15  ALKPHOS 52  BILITOT 1.3*  PROT 7.1  ALBUMIN 3.6   No results for input(s): "LIPASE", "AMYLASE" in the last 168 hours. No results for input(s): "AMMONIA" in the last 168 hours. Coagulation Profile: No results for input(s): "INR", "PROTIME" in the last 168 hours. CBC: Recent Labs  Lab 03/25/22 1932 03/26/22 0625  WBC 8.0 10.8*  HGB 11.9* 12.3  HCT 33.7* 34.7*  MCV 88.5 88.3  PLT 189 189   Cardiac Enzymes: No results for input(s): "CKTOTAL", "CKMB", "CKMBINDEX", "TROPONINI" in the last 168 hours. BNP: Invalid input(s): "POCBNP" CBG: Recent Labs  Lab 03/26/22 0751  GLUCAP 387*   HbA1C: No results for input(s): "HGBA1C" in the last 72 hours. Urine analysis:    Component Value Date/Time   COLORURINE YELLOW 12/24/2021 0842   APPEARANCEUR Cloudy (A) 02/20/2022 1004   LABSPEC 1.021 12/24/2021 0842   PHURINE 7.0 12/24/2021 0842   GLUCOSEU 2+ (A) 02/20/2022 1004   HGBUR MODERATE (A) 12/24/2021 0842   BILIRUBINUR Negative 02/20/2022 1004   KETONESUR 5 (A) 12/24/2021 0842   PROTEINUR Negative 02/20/2022 1004   PROTEINUR NEGATIVE 12/24/2021 0842   UROBILINOGEN negative 10/11/2014 1109   UROBILINOGEN 2.0 (H) 08/14/2014 1157   NITRITE Positive (A) 02/20/2022 1004   NITRITE NEGATIVE 12/24/2021 0842   LEUKOCYTESUR 2+ (A) 02/20/2022 1004   LEUKOCYTESUR NEGATIVE 12/24/2021 0842   Sepsis Labs: '@LABRCNTIP'$ (procalcitonin:4,lacticidven:4) )No results found for this or any previous visit (from the past 240 hour(s)).   Scheduled Meds:  amLODipine  10 mg Oral Daily   aspirin EC  81 mg Oral Daily   atenolol  50 mg Oral BID   enoxaparin (LOVENOX) injection  40 mg Subcutaneous Q24H   insulin aspart  0-15 Units Subcutaneous TID WC   insulin aspart  0-5 Units Subcutaneous QHS   insulin glargine-yfgn  10  Units Subcutaneous QHS   isosorbide mononitrate  60 mg Oral Daily   pantoprazole (PROTONIX) IV  40 mg Intravenous Q24H   potassium chloride  40 mEq Oral Once   rOPINIRole  1 mg Oral QHS   rosuvastatin  20 mg Oral Daily   sertraline  50 mg Oral Daily   Continuous Infusions:  sodium chloride      Procedures/Studies: CT Angio Chest/Abd/Pel for Dissection W and/or W/WO  Result Date: 03/26/2022 CLINICAL DATA:  Chest pain and shortness of breath EXAM: CT ANGIOGRAPHY CHEST, ABDOMEN AND PELVIS TECHNIQUE: Non-contrast CT of the chest was initially obtained. Multidetector CT imaging through the chest, abdomen and pelvis was performed using the standard protocol during bolus administration of intravenous contrast. Multiplanar reconstructed images and MIPs were obtained and reviewed to evaluate the vascular anatomy. RADIATION DOSE REDUCTION: This exam was performed according to the departmental dose-optimization program which includes automated exposure control, adjustment of the mA and/or kV according to patient size and/or use of iterative reconstruction  technique. CONTRAST:  168m OMNIPAQUE IOHEXOL 350 MG/ML SOLN. 4 hour premedication was administered due to prior contrast allergy. COMPARISON:  Chest x-ray from the previous day. FINDINGS: CTA CHEST FINDINGS Cardiovascular: Precontrast images demonstrate atherosclerotic calcifications of the aorta. No hyperdense crescent to suggest acute aortic abnormality is noted. Post-contrast images show atherosclerotic changes without aneurysmal dilatation or dissection. No cardiac enlargement is seen. Coronary calcifications are noted. Pulmonary artery as visualized shows no pulmonary emboli. Mediastinum/Nodes: Thoracic inlet is within normal limits. No hilar or mediastinal adenopathy is noted. The esophagus is within normal limits. Lungs/Pleura: Lungs are well aerated bilaterally. No focal infiltrate or sizable effusion is seen. Musculoskeletal: Degenerative changes of  the thoracic spine are noted. No acute rib abnormality is seen. Review of the MIP images confirms the above findings. CTA ABDOMEN AND PELVIS FINDINGS VASCULAR Aorta: Atherosclerotic calcifications are noted without aneurysmal dilatation or dissection. Celiac: Patent without evidence of aneurysm, dissection, vasculitis or significant stenosis. SMA: Patent without evidence of aneurysm, dissection, vasculitis or significant stenosis. Renals: Both renal arteries are patent without evidence of aneurysm, dissection, vasculitis, fibromuscular dysplasia or significant stenosis. IMA: Patent without evidence of aneurysm, dissection, vasculitis or significant stenosis. Inflow: Iliacs demonstrate atherosclerotic calcifications. No aneurysmal dilatation or dissection is noted. Veins: No specific venous abnormality is noted. Review of the MIP images confirms the above findings. NON-VASCULAR Hepatobiliary: Gallbladder has been surgically removed. Fatty infiltration of the liver is noted. Pancreas: Unremarkable. No pancreatic ductal dilatation or surrounding inflammatory changes. Spleen: Normal in size without focal abnormality. Adrenals/Urinary Tract: Adrenal glands are within normal limits. Kidneys demonstrate a normal enhancement pattern bilaterally. No renal calculi or obstructive changes are seen. The ureters are within normal limits. The bladder is well distended. Stomach/Bowel: Scattered diverticular change of the colon is noted. No diverticulitis is seen. The appendix has been surgically removed. Small bowel and stomach are within normal limits. Lymphatic: No sizable lymphadenopathy is noted. Reproductive: Status post hysterectomy. No adnexal masses. Other: No abdominal wall hernia or abnormality. No abdominopelvic ascites. Musculoskeletal: Degenerative changes of lumbar spine are seen. Review of the MIP images confirms the above findings. IMPRESSION: CTA of the chest: No aortic aneurysmal dilatation or dissection is seen.  No pulmonary emboli are noted. CTA of the abdomen and pelvis: No acute arterial abnormality is noted. Fatty liver. Diverticulosis without diverticulitis. Electronically Signed   By: MInez CatalinaM.D.   On: 03/26/2022 03:23   DG Chest Port 1 View  Result Date: 03/25/2022 CLINICAL DATA:  Chest pain EXAM: PORTABLE CHEST 1 VIEW COMPARISON:  11/15/2020 FINDINGS: Stable cardiomediastinal contours. Aortic atherosclerosis. No focal airspace consolidation, pleural effusion, or pneumothorax. IMPRESSION: No active disease. Electronically Signed   By: NDavina PokeD.O.   On: 03/25/2022 20:09    DOrson Eva DO  [facility]  If 7PM-7AM, please contact night-coverage [email or web address] Password [email or web address] 03/26/2022, 8:36 AM   LOS: 0 days

## 2022-03-26 NOTE — H&P (View-Only) (Signed)
Cardiology Consultation:   Patient ID: [name] MRN: [id number]; DOB: 1951/01/16  Admit date: 03/25/2022 Date of Consult: 03/26/2022  PCP:  Gwenlyn Perking, FNP   [facility] Providers Cardiologist:  Minus Breeding, MD        Patient Profile:   [name] is a 71 y.o. female with a hx of CAD (s/p DES to LCx in 02/2007, BMS to LAD and Diagonal in 03/2007, cath in 02/2017 showing 50% stenosis along mid-LAD but not significant by FFR with patent stents in the mid-LAD and mid-LCx), HTN, HLD, Type 2 DM and carotid artery stenosis who is being seen 03/26/2022 for the evaluation of chest pain at the request of Dr. Josephine Cables.  History of Present Illness:   [name] was last examined by Dr. Percival Spanish in 04/2021 and denied any recent anginal symptoms. Was continued on her current cardiac medications including Amlodipine '10mg'$  daily, ASA '325mg'$  daily, Atenolol '50mg'$  BID, Crestor '40mg'$  daily and Imdur '60mg'$  daily.   She presented to [facility] ED on 03/25/2022 for evaluation of chest pain. She reports having chest pain starting the day prior to admission which felt like a pressure in the center of her chest. Did utilize SL NTG with temporary improvement in her pain. On the day of admission, her chest pain had progressed and was radiating into her back and shoulders with associated nausea which prompted her to seek evaluation. Reports her pain resembles her prior angina in the past when she required stent placement.   Initial labs show WBC 8.0, Hgb 11.9, platelets 189, Na+ 139, K+ 3.0 and creatinine 1.15 (close to baseline). Initial and repeat Hs Troponin negative at 11. CXR with no active cardiopulmonary disease. CTA showed no evidence of aortic dilatation or dissection. EKG shows NSR, HR 91 with 1st degree AV block with LAD.   Past Medical History:  Diagnosis Date   Anxiety    CAD (coronary artery disease)    DES to circumflex 02/2007, BMS to LAD and PTCA diagonal 03/2007   Carotid artery plaque     Mild   Cataract    Depression    Diverticulitis, colon    Elevated d-dimer 01/08/2014   Essential hypertension, benign    GERD (gastroesophageal reflux disease)    H/O hiatal hernia    HLD (hyperlipidemia)    IDDM (insulin dependent diabetes mellitus)    Migraine    "used to have them really bad; don't have them anymore" (01/07/2014)   MS (multiple sclerosis) ([place])    Not confirmed   PAT (paroxysmal atrial tachycardia) (HCC)    Prolapse of uterus    PVD (peripheral vascular disease) (HCC)    TIA (transient ischemic attack) 1980's    Past Surgical History:  Procedure Laterality Date   ABDOMINAL HYSTERECTOMY  1986   ovaries remain - prolaspe uterus    APPENDECTOMY  ~ 1970   BREAST BIOPSY Right 1980's   BREAST LUMPECTOMY Right 1980's   Dr. Charlynne Pander    CARDIAC CATHETERIZATION  01/07/2014   CHOLECYSTECTOMY  ?1987   COLONOSCOPY  2002   Dr. Anwar--> Severe diverticular changes in the region of the sigmoid and descending colon with scattered diverticular changes throughout the rest of the colon. No polyps, ulcerations. Despite numerous manipulations, the tip of the scope could not be tipped into the cecal area.   COLONOSCOPY  01/10/2012   Procedure: COLONOSCOPY;  Surgeon: Daneil Dolin, MD;  [facility];  Service: Endoscopy;  Laterality: N/A;  1:55  CORONARY ANGIOPLASTY WITH STENT PLACEMENT  ~ 1997 X 2   "2 + 1"   EYE SURGERY Bilateral 2014   cataract   INTRAVASCULAR PRESSURE WIRE/FFR STUDY N/A 03/08/2017   Procedure: Intravascular Pressure Wire/FFR Study;  Surgeon: Nelva Bush, MD;  Location: [place] [facility];  Service: Cardiovascular;  Laterality: N/A;   LEFT HEART CATH AND CORONARY ANGIOGRAPHY N/A 03/08/2017   Procedure: Left Heart Cath and Coronary Angiography;  Surgeon: Nelva Bush, MD;  Location: [place] [facility];  Service: Cardiovascular;  Laterality: N/A;   LEFT HEART CATHETERIZATION WITH CORONARY ANGIOGRAM N/A 01/07/2014   Procedure: LEFT HEART  CATHETERIZATION WITH CORONARY ANGIOGRAM;  Surgeon: Larey Dresser, MD;  Location: [facility] CATH LAB;  Service: Cardiovascular;  Laterality: N/A;     Home Medications:  Prior to Admission medications   Medication Sig Start Date End Date Taking? Authorizing Provider  amLODipine (NORVASC) 10 MG tablet Take 1 tablet (10 mg total) by mouth daily. 10/12/20   Roxan Hockey, MD  aspirin 325 MG tablet Take 325 mg by mouth daily.    [provider]  atenolol (TENORMIN) 50 MG tablet Take 1 tablet (50 mg total) by mouth 2 (two) times daily. 04/10/21   Gwenlyn Perking, FNP  calcium carbonate (OS-CAL) 600 MG TABS tablet Take 1 tablet (600 mg total) by mouth 2 (two) times daily with a meal. 07/19/20   Gwenlyn Perking, FNP  citalopram (CELEXA) 40 MG tablet Take 40 mg by mouth daily.    [provider]  Continuous Blood Gluc Receiver (FREESTYLE LIBRE 2 READER) DEVI USE TO TEST BLOOD SUGAR 6 TIMES DAILY. Dx:E11.65 05/19/21   Gwenlyn Perking, FNP  Continuous Blood Gluc Sensor (FREESTYLE LIBRE 2 SENSOR) MISC USE TO TEST BLOOD SUGAR 6 TIMES DAILY 02/23/22   Gwenlyn Perking, FNP  furosemide (LASIX) 20 MG tablet TAKE ONE (1) TABLET EACH DAY 01/31/22   Gwenlyn Perking, FNP  hydrOXYzine (ATARAX/VISTARIL) 10 MG tablet Take 1 tablet (10 mg total) by mouth 3 (three) times daily as needed. 03/22/21   Gwenlyn Perking, FNP  insulin degludec (TRESIBA FLEXTOUCH) 100 UNIT/ML FlexTouch Pen Inject 60 Units into the skin daily.    [provider]  isosorbide mononitrate (IMDUR) 60 MG 24 hr tablet TAKE ONE (1) TABLET EACH DAY 03/02/21   Gwenlyn Perking, FNP  levocetirizine (XYZAL) 5 MG tablet Take 1 tablet (5 mg total) by mouth every evening. 01/24/22   Loman Brooklyn, FNP  meclizine (ANTIVERT) 25 MG tablet Take 1 tablet (25 mg total) by mouth 3 (three) times daily as needed for dizziness. 11/26/19   Dettinger, Fransisca Kaufmann, MD  meloxicam (MOBIC) 15 MG tablet Take 1 tablet (15 mg total) by mouth daily. Patient  not taking: Reported on 03/12/2022 12/27/21   Gwenlyn Perking, FNP  nitroGLYCERIN (NITROSTAT) 0.4 MG SL tablet DISSOLVE 1 TABLET UNDER TONGUE FOR CHESTPAIN.MAY REPEAT EVERY 5 MINUTES FOR 3 DOSES.IF NO RELIEF CALL 911 OR GO TO ER 06/08/20   Gwenlyn Perking, FNP  NOVOLOG FLEXPEN 100 UNIT/ML FlexPen INJECT 35-40 UNITS SQ 3 TIMES DAILY WITH MEALS Patient taking differently: 20-30 Units 3 (three) times daily with meals. Takes 8-30 units TID with meals depending on glucose 09/15/19   Dettinger, Fransisca Kaufmann, MD  ondansetron (ZOFRAN) 8 MG tablet Take 0.5 tablets (4 mg total) by mouth every 6 (six) hours as needed. 09/28/20   Gwenlyn Perking, FNP  potassium chloride (KLOR-CON M) 10 MEQ tablet Take 1 tablet (  10 mEq total) by mouth daily. Patient not taking: Reported on 03/12/2022 12/28/21   Gwenlyn Perking, FNP  rOPINIRole (REQUIP) 0.5 MG tablet Take 2 tablets (1 mg total) by mouth at bedtime. 01/09/21   Gwenlyn Perking, FNP  rosuvastatin (CRESTOR) 20 MG tablet Take 1 tablet (20 mg total) by mouth daily. 03/14/22   Gwenlyn Perking, FNP  Semaglutide, 1 MG/DOSE, (OZEMPIC, 1 MG/DOSE,) 2 MG/1.5ML SOPN Inject 1 mg into the skin once a week.    [provider]  sertraline (ZOLOFT) 50 MG tablet Take 1 tablet (50 mg total) by mouth daily. 11/25/20   Gwenlyn Perking, FNP  traZODone (DESYREL) 100 MG tablet Take 1 tablet (100 mg total) by mouth at bedtime as needed for sleep. 01/15/22   Gwenlyn Perking, FNP  triamcinolone ointment (KENALOG) 0.5 % Apply 1 application. topically 2 (two) times daily. 01/15/22   Gwenlyn Perking, FNP  vitamin B-12 (CYANOCOBALAMIN) 1000 MCG tablet Take 1,000 mcg by mouth daily. Patient not taking: Reported on 03/12/2022    [provider]  Vitamin D, Ergocalciferol, (DRISDOL) 1.25 MG (50000 UNIT) CAPS capsule TAKE 1 CAPSULE EVERY 7 DAYS Patient not taking: Reported on 03/12/2022 07/19/20   Gwenlyn Perking, FNP    Inpatient Medications: Scheduled Meds:  amLODipine  10 mg  Oral Daily   aspirin EC  81 mg Oral Daily   atenolol  50 mg Oral BID   enoxaparin (LOVENOX) injection  40 mg Subcutaneous Q24H   insulin aspart  0-15 Units Subcutaneous TID WC   insulin aspart  0-5 Units Subcutaneous QHS   insulin glargine-yfgn  10 Units Subcutaneous QHS   isosorbide mononitrate  60 mg Oral Daily   pantoprazole (PROTONIX) IV  40 mg Intravenous Q24H   potassium chloride  40 mEq Oral Once   rOPINIRole  1 mg Oral QHS   rosuvastatin  20 mg Oral Daily   sertraline  50 mg Oral Daily   Continuous Infusions:  sodium chloride     PRN Meds: acetaminophen **OR** acetaminophen, hydrALAZINE, ondansetron (ZOFRAN) IV  Allergies:    Allergies  Allergen Reactions   Iohexol      Desc: pt had syncopal episode with nausea post IV CM late 1990's,  pt has had prednisone prep with heart caths x 2 without problem  kdean 04/16/07, Onset Date: [id number]    Ticlid [Ticlopidine Hcl] Nausea And Vomiting   Jardiance [Empagliflozin] Other (See Comments)    Recurrent UTIs   Metformin And Related Diarrhea   Codeine Nausea And Vomiting and Palpitations    Social History:   Social History   Socioeconomic History   Marital status: Widowed    Spouse name: Not on file   Number of children: 4   Years of education: 30   Highest education level: 11th grade  Occupational History   Occupation: Disability    Employer: DISABLED  Tobacco Use   Smoking status: Never   Smokeless tobacco: Never   Tobacco comments:    spouse, 28 years - husband has quit 01/2011  Vaping Use   Vaping Use: Never used  Substance and Sexual Activity   Alcohol use: No   Drug use: No   Sexual activity: Not Currently  Other Topics Concern   Not on file  Social History Narrative   Lives alone, one level, handicap accessible bathroom, her children all live nearby   Social Determinants of Health   Financial Resource Strain: Low Risk  (01/22/2022)   Overall Financial  Resource Strain (CARDIA)    Difficulty of Paying  Living Expenses: Not very hard  Food Insecurity: No Food Insecurity (01/22/2022)   Hunger Vital Sign    Worried About Running Out of Food in the Last Year: Never true    Ran Out of Food in the Last Year: Never true  Transportation Needs: No Transportation Needs (01/22/2022)   PRAPARE - Hydrologist (Medical): No    Lack of Transportation (Non-Medical): No  Physical Activity: Insufficiently Active (01/22/2022)   Exercise Vital Sign    Days of Exercise per Week: 7 days    Minutes of Exercise per Session: 20 min  Stress: No Stress Concern Present (01/22/2022)   [place]    Feeling of Stress : Only a little  Social Connections: Moderately Integrated (01/22/2022)   Social Connection and Isolation Panel [NHANES]    Frequency of Communication with Friends and Family: More than three times a week    Frequency of Social Gatherings with Friends and Family: More than three times a week    Attends Religious Services: More than 4 times per year    Active Member of [employer] or Organizations: Yes    Attends Archivist Meetings: More than 4 times per year    Marital Status: Widowed  Intimate Partner Violence: Not At Risk (01/22/2022)   Humiliation, Afraid, Rape, and Kick questionnaire    Fear of Current or Ex-Partner: No    Emotionally Abused: No    Physically Abused: No    Sexually Abused: No    Family History:    Family History  Problem Relation Age of Onset   Heart attack Mother 20   Diabetes Mother    Hypertension Mother    Heart attack Father 16   Heart attack Brother 50       x 6   Heart disease Brother    Diabetes Brother    Colon cancer Paternal Aunt        87s, died with brain anuerysm   Crohn's disease Cousin        paternal   Diabetes Sister    GER disease Daughter    Cervical cancer Daughter    Diabetes Daughter      ROS:  Please see the history of present illness.   All  other ROS reviewed and negative.     Physical Exam/Data:   Vitals:   03/26/22 0300 03/26/22 0331 03/26/22 0500 03/26/22 0546  BP: (!) 195/73 (!) 172/75 (!) 166/59 (!) 188/72  Pulse:  91 93 98  Resp: '14 17 16 18  '$ Temp:   98.4 F (36.9 C)   TempSrc:   Oral   SpO2: 99% 97% 95% 97%  Weight:      Height:       No intake or output data in the 24 hours ending 03/26/22 0835    03/25/2022    7:19 PM 03/12/2022    8:05 AM 01/24/2022    8:14 AM  Last 3 Weights  Weight (lbs) 160 lb 161 lb 8 oz 156 lb 3.2 oz  Weight (kg) 72.576 kg 73.256 kg 70.852 kg     Body mass index is 25.82 kg/m.  General:  Well nourished, well developed, in no acute distress HEENT: normal Neck: no JVD Vascular: No carotid bruits; Distal pulses 2+ bilaterally Cardiac:  normal S1, S2; RRR; no murmur. Lungs:  clear to auscultation bilaterally, no wheezing, rhonchi or rales  Abd: soft, nontender, no hepatomegaly  Ext: no edema Musculoskeletal:  No deformities, BUE and BLE strength normal and equal Skin: warm and dry  Neuro:  CNs 2-12 intact, no focal abnormalities noted Psych:  Normal affect   EKG:  The EKG was personally reviewed and demonstrates: NSR, HR 91 with 1st degree AV block with LAD.  Telemetry:  Telemetry was personally reviewed and demonstrates: NSR with intermittent sinus tachycardia, HR in 90's to low-100's.   Relevant CV Studies:  LHC: 02/2017 Conclusions: Mild to moderate, nonobstructive coronary artery disease that has not changed significantly since 2015. Most significant stenosis is 50% in the mid LAD proximal to the previously placed stent. This is not hemodynamically significant by FFR (0.91). Patent stents in the mid LAD and mid LCx. Mildly elevated left ventricular filling pressure. Normal to hyperdynamic left ventricular systolic function.   Recommendations: Medical therapy and aggressive secondary prevention.    Echo: 08/2017 Study Conclusions   - Left ventricle: The cavity size  was normal. Wall thickness was    increased in a pattern of mild LVH. Systolic function was normal.    The estimated ejection fraction was in the range of 60% to 65%.    Wall motion was normal; there were no regional wall motion    abnormalities. Doppler parameters are consistent with abnormal    left ventricular relaxation (grade 1 diastolic dysfunction). The    E/e&' ratio is >15, suggesting elevated LV filling pressure.  - Mitral valve: Calcified annulus. Mildly thickened leaflets . Mild    stenosis. Pressure half-time: 79 ms. Mean gradient (D): 4 mm Hg.    Valve area by continuity equation (using LVOT flow): 1.41 cm^2.  - Left atrium: The atrium was normal in size.  - Inferior vena cava: The vessel was normal in size. The    respirophasic diameter changes were in the normal range (>= 50%),    consistent with normal central venous pressure.   Impressions:   - Compared to a prior study in 2016, the LVEF is lower at 60-65%.    Mild mitral stenosis with elevated LV filling pressure is again    noted.   Laboratory Data:  High Sensitivity Troponin:   Recent Labs  Lab 03/25/22 1932 03/25/22 2118  TROPONINIHS 11 11     Chemistry Recent Labs  Lab 03/25/22 1932 03/26/22 0625  NA 139 136  K 3.0* 3.6  CL 101 99  CO2 30 24  GLUCOSE 270* 395*  BUN 11 13  CREATININE 1.15* 1.00  CALCIUM 9.1 8.8*  MG  --  1.7  GFRNONAA 50* 59*  ANIONGAP 8 13    Recent Labs  Lab 03/26/22 0625  PROT 7.1  ALBUMIN 3.6  AST 19  ALT 15  ALKPHOS 52  BILITOT 1.3*   Lipids No results for input(s): "CHOL", "TRIG", "HDL", "LABVLDL", "LDLCALC", "CHOLHDL" in the last 168 hours.  Hematology Recent Labs  Lab 03/25/22 1932 03/26/22 0625  WBC 8.0 10.8*  RBC 3.81* 3.93  HGB 11.9* 12.3  HCT 33.7* 34.7*  MCV 88.5 88.3  MCH 31.2 31.3  MCHC 35.3 35.4  RDW 13.5 13.4  PLT 189 189   Thyroid No results for input(s): "TSH", "FREET4" in the last 168 hours.  BNPNo results for input(s): "BNP", "PROBNP"  in the last 168 hours.  DDimer No results for input(s): "DDIMER" in the last 168 hours.   Radiology/Studies:  CT Angio Chest/Abd/Pel for Dissection W and/or W/WO  Result Date: 03/26/2022 CLINICAL DATA:  Chest  pain and shortness of breath EXAM: CT ANGIOGRAPHY CHEST, ABDOMEN AND PELVIS TECHNIQUE: Non-contrast CT of the chest was initially obtained. Multidetector CT imaging through the chest, abdomen and pelvis was performed using the standard protocol during bolus administration of intravenous contrast. Multiplanar reconstructed images and MIPs were obtained and reviewed to evaluate the vascular anatomy. RADIATION DOSE REDUCTION: This exam was performed according to the departmental dose-optimization program which includes automated exposure control, adjustment of the mA and/or kV according to patient size and/or use of iterative reconstruction technique. CONTRAST:  177m OMNIPAQUE IOHEXOL 350 MG/ML SOLN. 4 hour premedication was administered due to prior contrast allergy. COMPARISON:  Chest x-ray from the previous day. FINDINGS: CTA CHEST FINDINGS Cardiovascular: Precontrast images demonstrate atherosclerotic calcifications of the aorta. No hyperdense crescent to suggest acute aortic abnormality is noted. Post-contrast images show atherosclerotic changes without aneurysmal dilatation or dissection. No cardiac enlargement is seen. Coronary calcifications are noted. Pulmonary artery as visualized shows no pulmonary emboli. Mediastinum/Nodes: Thoracic inlet is within normal limits. No hilar or mediastinal adenopathy is noted. The esophagus is within normal limits. Lungs/Pleura: Lungs are well aerated bilaterally. No focal infiltrate or sizable effusion is seen. Musculoskeletal: Degenerative changes of the thoracic spine are noted. No acute rib abnormality is seen. Review of the MIP images confirms the above findings. CTA ABDOMEN AND PELVIS FINDINGS VASCULAR Aorta: Atherosclerotic calcifications are noted without  aneurysmal dilatation or dissection. Celiac: Patent without evidence of aneurysm, dissection, vasculitis or significant stenosis. SMA: Patent without evidence of aneurysm, dissection, vasculitis or significant stenosis. Renals: Both renal arteries are patent without evidence of aneurysm, dissection, vasculitis, fibromuscular dysplasia or significant stenosis. IMA: Patent without evidence of aneurysm, dissection, vasculitis or significant stenosis. Inflow: Iliacs demonstrate atherosclerotic calcifications. No aneurysmal dilatation or dissection is noted. Veins: No specific venous abnormality is noted. Review of the MIP images confirms the above findings. NON-VASCULAR Hepatobiliary: Gallbladder has been surgically removed. Fatty infiltration of the liver is noted. Pancreas: Unremarkable. No pancreatic ductal dilatation or surrounding inflammatory changes. Spleen: Normal in size without focal abnormality. Adrenals/Urinary Tract: Adrenal glands are within normal limits. Kidneys demonstrate a normal enhancement pattern bilaterally. No renal calculi or obstructive changes are seen. The ureters are within normal limits. The bladder is well distended. Stomach/Bowel: Scattered diverticular change of the colon is noted. No diverticulitis is seen. The appendix has been surgically removed. Small bowel and stomach are within normal limits. Lymphatic: No sizable lymphadenopathy is noted. Reproductive: Status post hysterectomy. No adnexal masses. Other: No abdominal wall hernia or abnormality. No abdominopelvic ascites. Musculoskeletal: Degenerative changes of lumbar spine are seen. Review of the MIP images confirms the above findings. IMPRESSION: CTA of the chest: No aortic aneurysmal dilatation or dissection is seen. No pulmonary emboli are noted. CTA of the abdomen and pelvis: No acute arterial abnormality is noted. Fatty liver. Diverticulosis without diverticulitis. Electronically Signed   By: MInez CatalinaM.D.   On:  03/26/2022 03:23   DG Chest Port 1 View  Result Date: 03/25/2022 CLINICAL DATA:  Chest pain EXAM: PORTABLE CHEST 1 VIEW COMPARISON:  11/15/2020 FINDINGS: Stable cardiomediastinal contours. Aortic atherosclerosis. No focal airspace consolidation, pleural effusion, or pneumothorax. IMPRESSION: No active disease. Electronically Signed   By: NDavina PokeD.O.   On: 03/25/2022 20:09     Assessment and Plan:   1. Chest Pain concerning for Accelerating Angina - She presents with worsening chest discomfort over the past few days with associated nausea and dyspnea which resembles her prior angina. - Troponin values have been  negative and EKG is without acute ST changes. - She has been evaluated by Dr. Johnsie Cancel who recommends cardiac catheterization for definitive evaluation. The patient understands that risks include but are not limited to stroke (1 in 1000), death (1 in 27), kidney failure [usually temporary] (1 in 500), bleeding (1 in 200), allergic reaction [possibly serious] (1 in 200). Will add to the cath board for later today. She does have a contrast allergy and will order pre-medication today.  -Continue current medical therapy with ASA 81 mg daily, Amlodipine 10 mg daily, Atenolol 50 mg twice daily, Imdur 60 mg daily and Crestor 20 mg daily.  2. CAD - She is s/p DES to LCx in 02/2007, BMS to LAD and Diagonal in 03/2007 and cath in 02/2017 showed 50% stenosis along mid-LAD but not significant by FFR with patent stents in the mid-LAD and mid-LCx. - Will plan for repeat ischemic evaluation as outlined above. Continue ASA, beta-blocker, Imdur and statin therapy.  3. HTN - BP has been elevated, at 188/72 on most recent check. She is scheduled to receive Amlodipine and Atenolol this morning. If BP remains above goal, would add an ARB.  4. HLD - Will recheck an FLP. She remains on Crestor '20mg'$  daily.   5. Type 2 DM - Glucose remains poorly controlled, in the 300's this AM. Hgb A1c pending.  Management per the admitting team.     Risk Assessment/Risk Scores:     TIMI Risk Score for Unstable Angina or Non-ST Elevation MI:   The patient's TIMI risk score is 5, which indicates a 26% risk of all cause mortality, new or recurrent myocardial infarction or need for urgent revascularization in the next 14 days.   For questions or updates, please contact [place] Please consult [email or web address] for contact info under    Signed, Erma Heritage, PA-C  03/26/2022 8:35 AM   Patient examined chart reviewed Exam benign no murmur/bruit lungs clear ECG non acute troponin negative Last cath with stents to LAD/RCA and 50% stenosis prior to stent in LAD. Pain similar to previous angina relieved with nitro Favor transfer to [facility] for heart cath Has contrast allergy and has done well with premedication before Good right radial pulse Risks including stroke, bleeding, intubation and emergency surgery discussed willing to proceed Needs better BS control Hold glucophage prior to cath  Jenkins Rouge MD [facility]

## 2022-03-26 NOTE — Assessment & Plan Note (Signed)
Replete Check magnesium 

## 2022-03-27 DIAGNOSIS — I200 Unstable angina: Secondary | ICD-10-CM | POA: Diagnosis not present

## 2022-03-27 DIAGNOSIS — I2510 Atherosclerotic heart disease of native coronary artery without angina pectoris: Secondary | ICD-10-CM | POA: Diagnosis not present

## 2022-03-27 DIAGNOSIS — Z79899 Other long term (current) drug therapy: Secondary | ICD-10-CM | POA: Diagnosis not present

## 2022-03-27 DIAGNOSIS — E876 Hypokalemia: Secondary | ICD-10-CM | POA: Diagnosis not present

## 2022-03-27 DIAGNOSIS — I25110 Atherosclerotic heart disease of native coronary artery with unstable angina pectoris: Secondary | ICD-10-CM | POA: Diagnosis not present

## 2022-03-27 DIAGNOSIS — Z794 Long term (current) use of insulin: Secondary | ICD-10-CM | POA: Diagnosis not present

## 2022-03-27 DIAGNOSIS — Z955 Presence of coronary angioplasty implant and graft: Secondary | ICD-10-CM | POA: Diagnosis not present

## 2022-03-27 DIAGNOSIS — Z7984 Long term (current) use of oral hypoglycemic drugs: Secondary | ICD-10-CM | POA: Diagnosis not present

## 2022-03-27 DIAGNOSIS — F419 Anxiety disorder, unspecified: Secondary | ICD-10-CM

## 2022-03-27 DIAGNOSIS — G2581 Restless legs syndrome: Secondary | ICD-10-CM | POA: Diagnosis not present

## 2022-03-27 DIAGNOSIS — E1165 Type 2 diabetes mellitus with hyperglycemia: Secondary | ICD-10-CM | POA: Diagnosis not present

## 2022-03-27 DIAGNOSIS — R079 Chest pain, unspecified: Secondary | ICD-10-CM | POA: Diagnosis not present

## 2022-03-27 DIAGNOSIS — Z8673 Personal history of transient ischemic attack (TIA), and cerebral infarction without residual deficits: Secondary | ICD-10-CM | POA: Diagnosis not present

## 2022-03-27 DIAGNOSIS — I10 Essential (primary) hypertension: Secondary | ICD-10-CM | POA: Diagnosis not present

## 2022-03-27 DIAGNOSIS — Z7982 Long term (current) use of aspirin: Secondary | ICD-10-CM | POA: Diagnosis not present

## 2022-03-27 LAB — LIPID PANEL
Cholesterol: 103 mg/dL (ref 0–200)
HDL: 35 mg/dL — ABNORMAL LOW (ref >40)
LDL Cholesterol: 46 mg/dL (ref 0–99)
Total CHOL/HDL Ratio: 2.9 RATIO
Triglycerides: 111 mg/dL (ref <150)
VLDL: 22 mg/dL (ref 0–40)

## 2022-03-27 LAB — BASIC METABOLIC PANEL
Anion gap: 11 (ref 5–15)
BUN: 24 mg/dL — ABNORMAL HIGH (ref 8–23)
CO2: 22 mmol/L (ref 22–32)
Calcium: 8.7 mg/dL — ABNORMAL LOW (ref 8.9–10.3)
Chloride: 102 mmol/L (ref 98–111)
Creatinine, Ser: 1.27 mg/dL — ABNORMAL HIGH (ref 0.44–1.00)
GFR, Estimated: 45 mL/min — ABNORMAL LOW (ref >60)
Glucose, Bld: 337 mg/dL — ABNORMAL HIGH (ref 70–99)
Potassium: 4.4 mmol/L (ref 3.5–5.1)
Sodium: 135 mmol/L (ref 135–145)

## 2022-03-27 LAB — GLUCOSE, CAPILLARY
Glucose-Capillary: 365 mg/dL — ABNORMAL HIGH (ref 70–99)
Glucose-Capillary: 286 mg/dL — ABNORMAL HIGH (ref 70–99)
Glucose-Capillary: 291 mg/dL — ABNORMAL HIGH (ref 70–99)
Glucose-Capillary: 144 mg/dL — ABNORMAL HIGH (ref 70–99)

## 2022-03-27 LAB — CBC
HCT: 30.2 % — ABNORMAL LOW (ref 36.0–46.0)
Hemoglobin: 10.5 g/dL — ABNORMAL LOW (ref 12.0–15.0)
MCH: 31.4 pg (ref 26.0–34.0)
MCHC: 34.8 g/dL (ref 30.0–36.0)
MCV: 90.4 fL (ref 80.0–100.0)
Platelets: 162 10*3/uL (ref 150–400)
RBC: 3.34 MIL/uL — ABNORMAL LOW (ref 3.87–5.11)
RDW: 13.7 % (ref 11.5–15.5)
WBC: 20.3 10*3/uL — ABNORMAL HIGH (ref 4.0–10.5)
nRBC: 0 % (ref 0.0–0.2)

## 2022-03-27 LAB — HEMOGLOBIN A1C
Hgb A1c MFr Bld: 7.7 % — ABNORMAL HIGH (ref 4.8–5.6)
Mean Plasma Glucose: 174 mg/dL

## 2022-03-27 LAB — MAGNESIUM: Magnesium: 1.9 mg/dL (ref 1.7–2.4)

## 2022-03-27 MED ORDER — ASPIRIN 81 MG PO CHEW: 81.0000 mg | CHEWABLE_TABLET | Freq: Every day | ORAL | 3 refills | Status: AC

## 2022-03-27 MED ORDER — SERTRALINE HCL 50 MG PO TABS: 50.0000 mg | ORAL_TABLET | Freq: Every day | ORAL | 3 refills | Status: DC

## 2022-03-27 MED ORDER — INSULIN ASPART 100 UNIT/ML IJ SOLN
10.0000 [IU] | Freq: Three times a day (TID) | INTRAMUSCULAR | Status: DC
  Administered 2022-03-27: 10 [IU] via SUBCUTANEOUS

## 2022-03-27 MED ORDER — PANTOPRAZOLE SODIUM 40 MG PO TBEC: 40.0000 mg | DELAYED_RELEASE_TABLET | Freq: Every day | ORAL | Status: DC

## 2022-03-27 MED ORDER — NITROGLYCERIN 0.4 MG SL SUBL: SUBLINGUAL_TABLET | SUBLINGUAL | 3 refills | Status: DC

## 2022-03-27 MED ORDER — INSULIN ASPART 100 UNIT/ML IJ SOLN
10.0000 [IU] | Freq: Once | INTRAMUSCULAR | Status: AC
Start: 2022-03-27 — End: 2022-03-27
  Administered 2022-03-27: 10 [IU] via SUBCUTANEOUS

## 2022-03-27 MED ORDER — AMLODIPINE BESYLATE 10 MG PO TABS: 10.0000 mg | ORAL_TABLET | Freq: Every day | ORAL | 3 refills | Status: DC

## 2022-03-27 MED ORDER — ISOSORBIDE MONONITRATE ER 60 MG PO TB24: 60.0000 mg | ORAL_TABLET | Freq: Every day | ORAL | 3 refills | Status: DC

## 2022-03-27 MED ORDER — CLOPIDOGREL BISULFATE 75 MG PO TABS: 75.0000 mg | ORAL_TABLET | Freq: Every day | ORAL | 3 refills | Status: DC

## 2022-03-27 MED ORDER — INSULIN ASPART 100 UNIT/ML IJ SOLN
6.0000 [IU] | Freq: Three times a day (TID) | INTRAMUSCULAR | Status: DC
Start: 2022-03-27 — End: 2022-03-27

## 2022-03-27 NOTE — Care Management Obs Status (Signed)
[facility] NOTIFICATION   Patient Details  Name: AGATA LUCENTE MRN: [id number] Date of Birth: 1950-10-11   Medicare Observation Status Notification Given:  Yes    Bethena Roys, RN 03/27/2022, 10:10 AM

## 2022-03-27 NOTE — Discharge Instructions (Addendum)
PLEASE REMEMBER TO BRING ALL OF YOUR MEDICATIONS TO EACH OF YOUR FOLLOW-UP OFFICE VISITS.  PLEASE ATTEND ALL SCHEDULED FOLLOW-UP APPOINTMENTS.   Activity: Increase activity slowly as tolerated. You may shower, but no soaking baths (or swimming) for 1 week. No driving for 24 hours. No lifting over 5 lbs for 1 week. No sexual activity for 1 week.   You May Return to Work: in 1 week (if applicable)  Wound Care: You may wash cath site gently with soap and water. Keep cath site clean and dry. If you notice pain, swelling, bleeding or pus at your cath site, please call [phone].  PLEASE DO NOT MISS ANY DOSES OF YOUR PLAVIX!!!!! Also keep a log of you blood pressures and bring back to your follow up appt. Please call the office with any questions.   Patients taking blood thinners should generally stay away from medicines like ibuprofen, Advil, Motrin, naproxen, and Aleve due to risk of stomach bleeding. You may take Tylenol as directed or talk to your primary doctor about alternatives.   PLEASE ENSURE THAT YOU DO NOT RUN OUT OF YOUR PLAVIX. This medication is very important to remain on for at least one year. IF you have issues obtaining this medication due to cost please CALL the office 3-5 business days prior to running out in order to prevent missing doses of this medication.   Please keep a log of your blood pressures to bring to your follow-up visit to better assess if further medication changes need to occur.

## 2022-03-27 NOTE — Discharge Summary (Signed)
Discharge Summary    Patient ID: [name] MRN: [id number]; DOB: 04-01-1951  Admit date: 03/25/2022 Discharge date: 03/27/2022  PCP:  Gwenlyn Perking, FNP   [facility] Providers Cardiologist:  Minus Breeding, MD        Discharge Diagnoses    Principal Problem:   Coronary artery disease involving native coronary artery of native heart with unstable angina pectoris [facility]) Active Problems:   Mixed hyperlipidemia   Type 2 diabetes mellitus with hyperglycemia ([place])   Essential hypertension   Hypokalemia   Uncontrolled type 2 diabetes mellitus with hyperglycemia, with long-term current use of insulin (HCC)   Depression   Restless leg syndrome   Chest pain   Nausea and vomiting    Diagnostic Studies/Procedures    LHC 03/27/22   Mid Cx lesion is 90% stenosed.   Prox LAD to Mid LAD lesion is 95% stenosed.   Previously placed Prox Cx to Mid Cx stent of unknown type is  widely patent.   Previously placed Mid LAD stent of unknown type is  widely patent.   A drug-eluting stent was successfully placed.   A drug-eluting stent was successfully placed.   Post intervention, there is a 0% residual stenosis.   Post intervention, there is a 0% residual stenosis.   Post intervention, there is a 0% residual stenosis.  IMPRESSION: Successful mid circumflex and proximal LAD PCI drug-eluting stenting in the setting of unstable angina.  The patient will need to be on 12 months of uninterrupted DAPT.  She will be hydrated overnight and anticipate discharge tomorrow morning.  Diagnostic Dominance: Right  Intervention   _____________   History of Present Illness     [name] is a 71 y.o. female with a hx of CAD (s/p DES to LCx in 02/2007, BMS to LAD and Diagonal in 03/2007, cath in 02/2017 showing 50% stenosis along mid-LAD but not significant by FFR with patent stents in the mid-LAD and mid-LCx), HTN, HLD, Type 2 DM and carotid artery stenosis.  [name] was last examined by Dr.  Percival Spanish in 04/2021 and denied any recent anginal symptoms. Was continued on her current cardiac medications including Amlodipine 52m daily, ASA 3264mdaily, Atenolol 5085mID, Crestor 5m20mily and Imdur 60mg45mly.    [facility] of [facility] pain. She reports having chest pain starting the day prior to admission which felt like a pressure in the center of her chest. Did utilize SL NTG with temporary improvement in her pain. On the day of admission, her chest pain had progressed and was radiating into her back and shoulders with associated nausea which prompted her to seek evaluation. Reports her pain resembles her prior angina in the past when she required stent placement.    Initial labs show WBC 8.0, Hgb 11.9, platelets 189, Na+ 139, K+ 3.0 and creatinine 1.15 (close to baseline). Initial and repeat Hs Troponin negative at 11. CXR with no active cardiopulmonary disease. CTA showed no evidence of aortic dilatation or dissection. EKG shows NSR, HR 91 with 1st degree AV block with LAD.      Hospital Course     Consultants: None   1. CAD with unstable angina: patient presented with chest pain c/f accelerated angina. EKG was without acute ST changes. HsTrop negative. She was recommended to undergo a LHC and [facility] showed 95% LAD stenosis proximal to previously placed stent which was managed  with PCI/DES, as well as 90% mLCx stenosis distal to a previously placed stent managed with PCI/DES; otherwise no other CAD. She was recommended to continue DAPT uninterrupted for 12 months. Chest pain resolved - Continue aspirin and plavix - Continue crestor - Continue imdur, amlodipine, and atenolol  2. HTN: BP mildly elevated this admission.  - Continue amlodipine, atenolol, and lasix - Low salt diet encouraged - Recommended home blood pressure monitoring   3. HLD: LDL 46 this admission - Continue crestor  4. DM  type 2 with hyperglycemia: A1C 7.7 this admission; goal <6.5 - Continue home insulin and ozempic - Encouraged PCP follow-up for tighter glycemic control    Did the patient have an acute coronary syndrome (MI, NSTEMI, STEMI, etc) this admission?:  No                               Did the patient have a percutaneous coronary intervention (stent / angioplasty)?:  Yes.     Cath/PCI Registry Performance & Quality Measures: Aspirin prescribed? - Yes ADP Receptor Inhibitor (Plavix/Clopidogrel, Brilinta/Ticagrelor or Effient/Prasugrel) prescribed (includes medically managed patients)? - Yes High Intensity Statin (Lipitor 40-43m or Crestor 20-477m prescribed? - Yes For EF <40%, was ACEI/ARB prescribed? - Not Applicable (EF >/= 4030%For EF <40%, Aldosterone Antagonist (Spironolactone or Eplerenone) prescribed? - Not Applicable (EF >/= 4013%Cardiac Rehab Phase II ordered? - Yes       A TOC follow up appointment is scheduled for 04/06/22 with JeColetta MemosNP.    _____________  Discharge Vitals Blood pressure 135/60, pulse 78, temperature 98.7 F (37.1 C), temperature source Oral, resp. rate 18, height 5' 6"  (1.676 m), weight 72.6 kg, SpO2 98 %.  Filed Weights   03/25/22 1919  Weight: 72.6 kg    Labs & Radiologic Studies    CBC Recent Labs    03/26/22 0625 03/27/22 0130  WBC 10.8* 20.3*  HGB 12.3 10.5*  HCT 34.7* 30.2*  MCV 88.3 90.4  PLT 189 16143 Basic Metabolic Panel Recent Labs    03/26/22 0625 03/27/22 0130  NA 136 135  K 3.6 4.4  CL 99 102  CO2 24 22  GLUCOSE 395* 337*  BUN 13 24*  CREATININE 1.00 1.27*  CALCIUM 8.8* 8.7*  MG 1.7 1.9  PHOS 3.7  --    Liver Function Tests Recent Labs    03/26/22 0625  AST 19  ALT 15  ALKPHOS 52  BILITOT 1.3*  PROT 7.1  ALBUMIN 3.6   No results for input(s): "LIPASE", "AMYLASE" in the last 72 hours. High Sensitivity Troponin:   Recent Labs  Lab 03/25/22 1932 03/25/22 2118  TROPONINIHS 11 11    BNP Invalid  input(s): "POCBNP" D-Dimer No results for input(s): "DDIMER" in the last 72 hours. Hemoglobin A1C Recent Labs    03/26/22 0625  HGBA1C 7.7*   Fasting Lipid Panel Recent Labs    03/27/22 0537  CHOL 103  HDL 35*  LDLCALC 46  TRIG 111  CHOLHDL 2.9   Thyroid Function Tests No results for input(s): "TSH", "T4TOTAL", "T3FREE", "THYROIDAB" in the last 72 hours.  Invalid input(s): "FREET3" _____________  CARDIAC CATHETERIZATION  Result Date: 03/26/2022 Images from the original result were not included.   Mid Cx lesion is 90% stenosed.   Prox LAD to Mid LAD lesion is 95% stenosed.   Previously placed Prox Cx to Mid Cx stent of unknown type is  widely patent.  Previously placed Mid LAD stent of unknown type is  widely patent.   A drug-eluting stent was successfully placed.   A drug-eluting stent was successfully placed.   Post intervention, there is a 0% residual stenosis.   Post intervention, there is a 0% residual stenosis.   Post intervention, there is a 0% residual stenosis. [name] is a 71 y.o. female  [id number] LOCATION:  FACILITY: [facility] PHYSICIAN: Quay Burow, M.D. 1951/07/24 DATE OF PROCEDURE:  03/26/2022 DATE OF DISCHARGE: CARDIAC CATHETERIZATION / PCI DES LCX & LAD History obtained from chart review.[name] is a 71 y.o. female with a hx of CAD (s/p DES to LCx in 02/2007, BMS to LAD and Diagonal in 03/2007, cath in 02/2017 showing 50% stenosis along mid-LAD but not significant by FFR with patent stents in the mid-LAD and mid-LCx), HTN, HLD, Type 2 DM and carotid artery stenosis who is being seen 03/26/2022 for the evaluation of chest pain .  Her enzymes were negative.  EKG showed no acute changes.  Dr. Johnsie Cancel evaluated her and felt that she would benefit from diagnostic coronary angiography. PROCEDURE DESCRIPTION: The patient was brought to the second floor [facility] cath lab in the postabsorptive state.  She was premedicated with IV Versed and fentanyl.  Her right wrist was  prepped and shaved in usual sterile fashion. Xylocaine 1% was used for local anesthesia. A 6 French sheath was inserted into the right radial artery using standard Seldinger technique.  Ultrasound was used to identify the right radial artery and guide access.  Digital image was captured and placed the patient's chart.  The patient received a total of 10,500 units of heparin with an ACT of 275.  Isovue dye is used for the entirety of the case (180 cc of contrast total to patient).  Retrograde aortic, ventricular and pullback pressures were recorded.  Radial cocktail was administered via the SideArm sheath. The patient was loaded with 600 mg of clopidogrel and 20 mg of IV Pepcid.  Using a 6 [place] XB 3 cm guide catheter along with a 0.14 Prowater across the circumflex stenosis just at the distal edge of the previously placed stent.  I predilated with a 2 mm x 12 mm balloon and placed a 2.25 mm x 12 mm long Medtronic Onyx frontier drug-eluting stent deployed at 14 to 16 atm (2.36 mm) resulting in reduction of a 90% fairly focal lesion just beyond the distal edge of the previously placed stent to 0% residual. I then redirected the wire down the LAD across the tight proximal stenosis.  I placed a second Prowater in the diagonal branch to protect it.  I predilated the proximal LAD with the same 2 mm x 12 mm balloon and placed a 2.25 x 12 mm long drug-eluting stent just beyond the origin of the first diagonal branch and deployed at 14 to 16 atm.  I postdilated with a 2.5 x 8 mm long noncompliant balloon at 16 atm (2.56 mm) resulting in reduction of a 95% proximal LAD stenosis just beyond the first diagonal branch to 0% residual.  The first diagonal branch was preserved.  Both wires were removed as was the guide catheter.  The sheath was removed and a Zephyr wristband was placed in the patient's right wrist achieving patent hemostasis.   Successful mid circumflex and proximal LAD PCI drug-eluting stenting in the setting  of unstable angina.  The patient will need to be on 12 months of uninterrupted DAPT.  She will  be hydrated overnight and anticipate discharge tomorrow morning. Quay Burow. MD, [facility] 03/26/2022 6:23 PM    CT Angio Chest/Abd/Pel for Dissection W and/or W/WO  Result Date: 03/26/2022 CLINICAL DATA:  Chest pain and shortness of breath EXAM: CT ANGIOGRAPHY CHEST, ABDOMEN AND PELVIS TECHNIQUE: Non-contrast CT of the chest was initially obtained. Multidetector CT imaging through the chest, abdomen and pelvis was performed using the standard protocol during bolus administration of intravenous contrast. Multiplanar reconstructed images and MIPs were obtained and reviewed to evaluate the vascular anatomy. RADIATION DOSE REDUCTION: This exam was performed according to the departmental dose-optimization program which includes automated exposure control, adjustment of the mA and/or kV according to patient size and/or use of iterative reconstruction technique. CONTRAST:  147m OMNIPAQUE IOHEXOL 350 MG/ML SOLN. 4 hour premedication was administered due to prior contrast allergy. COMPARISON:  Chest x-ray from the previous day. FINDINGS: CTA CHEST FINDINGS Cardiovascular: Precontrast images demonstrate atherosclerotic calcifications of the aorta. No hyperdense crescent to suggest acute aortic abnormality is noted. Post-contrast images show atherosclerotic changes without aneurysmal dilatation or dissection. No cardiac enlargement is seen. Coronary calcifications are noted. Pulmonary artery as visualized shows no pulmonary emboli. Mediastinum/Nodes: Thoracic inlet is within normal limits. No hilar or mediastinal adenopathy is noted. The esophagus is within normal limits. Lungs/Pleura: Lungs are well aerated bilaterally. No focal infiltrate or sizable effusion is seen. Musculoskeletal: Degenerative changes of the thoracic spine are noted. No acute rib abnormality is seen. Review of the MIP images confirms the above findings. CTA  ABDOMEN AND PELVIS FINDINGS VASCULAR Aorta: Atherosclerotic calcifications are noted without aneurysmal dilatation or dissection. Celiac: Patent without evidence of aneurysm, dissection, vasculitis or significant stenosis. SMA: Patent without evidence of aneurysm, dissection, vasculitis or significant stenosis. Renals: Both renal arteries are patent without evidence of aneurysm, dissection, vasculitis, fibromuscular dysplasia or significant stenosis. IMA: Patent without evidence of aneurysm, dissection, vasculitis or significant stenosis. Inflow: Iliacs demonstrate atherosclerotic calcifications. No aneurysmal dilatation or dissection is noted. Veins: No specific venous abnormality is noted. Review of the MIP images confirms the above findings. NON-VASCULAR Hepatobiliary: Gallbladder has been surgically removed. Fatty infiltration of the liver is noted. Pancreas: Unremarkable. No pancreatic ductal dilatation or surrounding inflammatory changes. Spleen: Normal in size without focal abnormality. Adrenals/Urinary Tract: Adrenal glands are within normal limits. Kidneys demonstrate a normal enhancement pattern bilaterally. No renal calculi or obstructive changes are seen. The ureters are within normal limits. The bladder is well distended. Stomach/Bowel: Scattered diverticular change of the colon is noted. No diverticulitis is seen. The appendix has been surgically removed. Small bowel and stomach are within normal limits. Lymphatic: No sizable lymphadenopathy is noted. Reproductive: Status post hysterectomy. No adnexal masses. Other: No abdominal wall hernia or abnormality. No abdominopelvic ascites. Musculoskeletal: Degenerative changes of lumbar spine are seen. Review of the MIP images confirms the above findings. IMPRESSION: CTA of the chest: No aortic aneurysmal dilatation or dissection is seen. No pulmonary emboli are noted. CTA of the abdomen and pelvis: No acute arterial abnormality is noted. Fatty liver.  Diverticulosis without diverticulitis. Electronically Signed   By: MInez CatalinaM.D.   On: 03/26/2022 03:23   DG Chest Port 1 View  Result Date: 03/25/2022 CLINICAL DATA:  Chest pain EXAM: PORTABLE CHEST 1 VIEW COMPARISON:  11/15/2020 FINDINGS: Stable cardiomediastinal contours. Aortic atherosclerosis. No focal airspace consolidation, pleural effusion, or pneumothorax. IMPRESSION: No active disease. Electronically Signed   By: NDavina PokeD.O.   On: 03/25/2022 20:09    Disposition   Pt is being  discharged home today in good condition.  Follow-up Plans & Appointments     Follow-up Information     Gwenlyn Perking, FNP Follow up.   Specialty: Family Medicine Why: Please call to schedule an appointment to be seen within 2 weeks - you would benefit from better blood sugar control Contact information: [place] [address] [phone]         Deberah Pelton, NP Follow up on 04/06/2022.   Specialty: Cardiology Why: Please arrive 15 minutes early for your 8:25am post-hospital cardiology appointment Contact information: [address] [place] [address] [phone]                Discharge Instructions     AMB Referral to Cardiac Rehabilitation - Phase II   Complete by: As directed    Diagnosis: Coronary Stents   After initial evaluation and assessments completed: Virtual Based Care may be provided alone or in conjunction with Phase 2 Cardiac Rehab based on patient barriers.: Yes   Diet - low sodium heart healthy   Complete by: As directed    Diet Carb Modified   Complete by: As directed    Increase activity slowly   Complete by: As directed    Increase activity slowly   Complete by: As directed        Discharge Medications   Allergies as of 03/27/2022       Reactions   Iohexol     Desc: pt had syncopal episode with nausea post IV CM late 1990's,  pt has had prednisone prep with heart caths x 2 without problem  kdean 04/16/07,  Onset Date: [id number]   Ticlid [ticlopidine Hcl] Nausea And Vomiting   Jardiance [empagliflozin] Other (See Comments)   Recurrent UTIs   Metformin And Related Diarrhea   Codeine Nausea And Vomiting, Palpitations        Medication List     STOP taking these medications    aspirin 325 MG tablet Replaced by: aspirin 81 MG chewable tablet   citalopram 40 MG tablet Commonly known as: CELEXA       TAKE these medications    amLODipine 10 MG tablet Commonly known as: NORVASC Take 1 tablet (10 mg total) by mouth daily.   aspirin 81 MG chewable tablet Chew 1 tablet (81 mg total) by mouth daily. Start taking on: March 28, 2022 Replaces: aspirin 325 MG tablet   atenolol 50 MG tablet Commonly known as: TENORMIN Take 1 tablet (50 mg total) by mouth 2 (two) times daily.   clopidogrel 75 MG tablet Commonly known as: PLAVIX Take 1 tablet (75 mg total) by mouth daily with breakfast. Start taking on: March 28, 2022   FreeStyle Libre 2 Reader Kerrin Mo USE TO TEST BLOOD SUGAR 6 TIMES DAILY. Dx:E11.65   FreeStyle Libre 2 Sensor Misc USE TO TEST BLOOD SUGAR 6 TIMES DAILY   furosemide 20 MG tablet Commonly known as: LASIX TAKE ONE (1) TABLET EACH DAY What changed: See the new instructions.   isosorbide mononitrate 60 MG 24 hr tablet Commonly known as: IMDUR Take 1 tablet (60 mg total) by mouth daily. What changed: See the new instructions.   nitroGLYCERIN 0.4 MG SL tablet Commonly known as: NITROSTAT DISSOLVE 1 TABLET UNDER TONGUE FOR CHESTPAIN.MAY REPEAT EVERY 5 MINUTES FOR 3 DOSES.IF NO RELIEF CALL 911 OR GO TO ER What changed:  how much to take how to take this when to take this reasons to take this additional instructions  NovoLOG FlexPen 100 UNIT/ML FlexPen Generic drug: insulin aspart INJECT 35-40 UNITS SQ 3 TIMES DAILY WITH MEALS What changed: See the new instructions.   Ozempic (1 MG/DOSE) 2 MG/1.5ML Sopn Generic drug: Semaglutide (1 MG/DOSE) Inject 1 mg into the  skin once a week.   rOPINIRole 0.5 MG tablet Commonly known as: Requip Take 2 tablets (1 mg total) by mouth at bedtime. What changed:  when to take this reasons to take this   rosuvastatin 20 MG tablet Commonly known as: CRESTOR Take 1 tablet (20 mg total) by mouth daily.   sertraline 50 MG tablet Commonly known as: ZOLOFT Take 1 tablet (50 mg total) by mouth daily.   traZODone 100 MG tablet Commonly known as: DESYREL Take 1 tablet (100 mg total) by mouth at bedtime as needed for sleep.   Tyler Aas FlexTouch 100 UNIT/ML FlexTouch Pen Generic drug: insulin degludec Inject 60 Units into the skin daily.           Outstanding Labs/Studies   None  Duration of Discharge Encounter   Greater than 30 minutes including physician time.  Signed, Abigail Butts, PA-C 03/27/2022, 1:06 PM

## 2022-03-27 NOTE — Progress Notes (Signed)
[facility] consult note                                                                              Ranyia Witting, is a 71 y.o. female, DOB - 27-Jun-1951, UMP:[id number] Admit date - 03/25/2022    Outpatient Primary MD for the patient is Gwenlyn Perking, FNP  LOS - 0  days  Chief Complaint  Patient presents with   Chest Pain       Brief summary   Patient is a 71 year old female with CAD.  DES and BMS in 2008, HTN, HLP, DM type II, TIA presented with chest pain.  Chest x-ray negative, CTA negative for PE or dissection.  Given history of CAD, cardiology was consulted, patient was transferred from [facility] for further work-up   Assessment & Plan    Principal Problem:   Coronary artery disease involving native coronary artery of native heart with unstable angina pectoris ([place]) -Status post cardiac cath, showed 95% LAD stenosis, 90% mid LCx stenosis, managed with PCI/DES -Continue dual platelet agents, aspirin and Plavix, Crestor. -TOC for med assistance.  Active Problems:   Uncontrolled type 2 diabetes mellitus with hyperglycemia, with long-term current use of insulin ([place]) -CBGs uncontrolled, secondary to steroids Recent Labs    03/26/22 1843 03/26/22 2127 03/27/22 0235 03/27/22 0630 03/27/22 0734 03/27/22 1123  GLUCAP 300* 483* 365* 286* 291* 144*   -Overnight CBGs in 400s, received extra doses of NovoLog -Increased Semglee to 30 units at bedtime,  added NovoLog meal coverage 10 units 3 times daily AC, continue SSI moderate -Diabetic coordinator consult was placed -Patient was cleared to be discharged home by cardiology.  Recommend to continue outpatient regimen with insulin and Ozempic    Essential hypertension -Stable, continue Klonopin, atenolol and Lasix    Mixed hyperlipidemia -Continue Crestor   Estimated body mass index is 25.82 kg/m as calculated from the following:   Height as of this encounter: _0  (1.676 m).   Weight as of  this encounter: 72.6 kg.  Code Status: Full code DVT Prophylaxis:     Level of Care: Level of care: Telemetry Cardiac Disposition Plan:    Disposition per primary team, cardiology   Medications  amLODipine  10 mg Oral Daily   aspirin  81 mg Oral Daily   atenolol  50 mg Oral BID   clopidogrel  75 mg Oral Q breakfast   enoxaparin (LOVENOX) injection  40 mg Subcutaneous Q24H   furosemide  20 mg Oral Daily   insulin aspart  0-15 Units Subcutaneous TID AC & HS   insulin aspart  10 Units Subcutaneous TID WC   insulin glargine-yfgn  30 Units Subcutaneous QHS   isosorbide mononitrate  60 mg Oral Daily   [START ON 03/28/2022] pantoprazole  40 mg Oral Daily   rOPINIRole  1 mg Oral QHS   rosuvastatin  20 mg Oral Daily   sertraline  50 mg Oral Daily   sodium chloride flush  3 mL Intravenous Q12H      Subjective:   Gwyndolyn Guilford was seen and examined today AM .  No acute complaints, chest pain resolved  after DES.  Looking forward to go home today.   Objective:   Vitals:   03/26/22 2021 03/27/22 0023 03/27/22 0509 03/27/22 0801  BP: 126/74 (!) 133/55 (!) 139/59 135/60  Pulse: 90 80 80 78  Resp:  _0 Temp:  98.2 F (36.8 C) 98.7 F (37.1 C)   TempSrc:  Oral Oral   SpO2:   98%   Weight:      Height:        Intake/Output Summary (Last 24 hours) at 03/27/2022 1309 Last data filed at 03/27/2022 0804 Gross per 24 hour  Intake 999.75 ml  Output --  Net 999.75 ml     Wt Readings from Last 3 Encounters:  03/25/22 72.6 kg  03/12/22 73.3 kg  01/24/22 70.9 kg     Exam General: Alert and oriented x 3, NAD Cardiovascular: S1 S2 auscultated,  RRR Respiratory: Clear to auscultation bilaterally, no wheezing, rales or rhonchi Gastrointestinal: Soft, nontender, nondistended, + bowel sounds Ext: no pedal edema bilaterally Psych: Normal affect and demeanor, alert and oriented x3     Data Reviewed:  I have personally reviewed following labs    CBC Lab Results  Component  Value Date   WBC 20.3 (H) 03/27/2022   RBC 3.34 (L) 03/27/2022   HGB 10.5 (L) 03/27/2022   HCT 30.2 (L) 03/27/2022   MCV 90.4 03/27/2022   MCH 31.4 03/27/2022   PLT 162 03/27/2022   MCHC 34.8 03/27/2022   RDW 13.7 03/27/2022   LYMPHSABS 2.8 03/12/2022   MONOABS 0.4 11/15/2020   EOSABS 0.2 03/12/2022   BASOSABS 0.0 22/48/2500     Last metabolic panel Lab Results  Component Value Date   NA 135 03/27/2022   K 4.4 03/27/2022   CL 102 03/27/2022   CO2 22 03/27/2022   BUN 24 (H) 03/27/2022   CREATININE 1.27 (H) 03/27/2022   GLUCOSE 337 (H) 03/27/2022   GFRNONAA 45 (L) 03/27/2022   GFRAA 53 (L) 10/17/2020   CALCIUM 8.7 (L) 03/27/2022   PHOS 3.7 03/26/2022   PROT 7.1 03/26/2022   ALBUMIN 3.6 03/26/2022   LABGLOB 2.4 03/12/2022   AGRATIO 1.7 03/12/2022   BILITOT 1.3 (H) 03/26/2022   ALKPHOS 52 03/26/2022   AST 19 03/26/2022   ALT 15 03/26/2022   ANIONGAP 11 03/27/2022    CBG (last 3)  Recent Labs    03/27/22 0630 03/27/22 0734 03/27/22 1123  GLUCAP 286* 291* 144*      Coagulation Profile: No results for input(s): "INR", "PROTIME" in the last 168 hours.   Radiology Studies: I have personally reviewed the imaging studies  CARDIAC CATHETERIZATION  Result Date: 03/26/2022 Images from the original result were not included.   Mid Cx lesion is 90% stenosed.   Prox LAD to Mid LAD lesion is 95% stenosed.   Previously placed Prox Cx to Mid Cx stent of unknown type is  widely patent.   Previously placed Mid LAD stent of unknown type is  widely patent.   A drug-eluting stent was successfully placed.   A drug-eluting stent was successfully placed.   Post intervention, there is a 0% residual stenosis.   Post intervention, there is a 0% residual stenosis.   Post intervention, there is a 0% residual stenosis. TAMARI REDWINE is a 71 y.o. female  [id number] LOCATION:  FACILITY: [facility] PHYSICIAN: Quay Burow, M.D. 1951-04-05 DATE OF PROCEDURE:  03/26/2022 DATE OF DISCHARGE: CARDIAC  CATHETERIZATION / PCI DES LCX & LAD History obtained from chart  review.KEALOHILANI MAIORINO is a 71 y.o. female with a hx of CAD (s/p DES to LCx in 02/2007, BMS to LAD and Diagonal in 03/2007, cath in 02/2017 showing 50% stenosis along mid-LAD but not significant by FFR with patent stents in the mid-LAD and mid-LCx), HTN, HLD, Type 2 DM and carotid artery stenosis who is being seen 03/26/2022 for the evaluation of chest pain .  Her enzymes were negative.  EKG showed no acute changes.  Dr. Johnsie Cancel evaluated her and felt that she would benefit from diagnostic coronary angiography. PROCEDURE DESCRIPTION: The patient was brought to the second floor [facility]iac cath lab in the postabsorptive state.  She was premedicated with IV Versed and fentanyl.  Her right wrist was prepped and shaved in usual sterile fashion. Xylocaine 1% was used for local anesthesia. A 6 French sheath was inserted into the right radial artery using standard Seldinger technique.  Ultrasound was used to identify the right radial artery and guide access.  Digital image was captured and placed the patient's chart.  The patient received a total of 10,500 units of heparin with an ACT of 275.  Isovue dye is used for the entirety of the case (180 cc of contrast total to patient).  Retrograde aortic, ventricular and pullback pressures were recorded.  Radial cocktail was administered via the SideArm sheath. The patient was loaded with 600 mg of clopidogrel and 20 mg of IV Pepcid.  Using a 6 [place] XB 3 cm guide catheter along with a 0.14 Prowater across the circumflex stenosis just at the distal edge of the previously placed stent.  I predilated with a 2 mm x 12 mm balloon and placed a 2.25 mm x 12 mm long Medtronic Onyx frontier drug-eluting stent deployed at 14 to 16 atm (2.36 mm) resulting in reduction of a 90% fairly focal lesion just beyond the distal edge of the previously placed stent to 0% residual. I then redirected the wire down the LAD across the  tight proximal stenosis.  I placed a second Prowater in the diagonal branch to protect it.  I predilated the proximal LAD with the same 2 mm x 12 mm balloon and placed a 2.25 x 12 mm long drug-eluting stent just beyond the origin of the first diagonal branch and deployed at 14 to 16 atm.  I postdilated with a 2.5 x 8 mm long noncompliant balloon at 16 atm (2.56 mm) resulting in reduction of a 95% proximal LAD stenosis just beyond the first diagonal branch to 0% residual.  The first diagonal branch was preserved.  Both wires were removed as was the guide catheter.  The sheath was removed and a Zephyr wristband was placed in the patient's right wrist achieving patent hemostasis.   Successful mid circumflex and proximal LAD PCI drug-eluting stenting in the setting of unstable angina.  The patient will need to be on 12 months of uninterrupted DAPT.  She will be hydrated overnight and anticipate discharge tomorrow morning. Quay Burow. MD, [facility] 03/26/2022 6:23 PM    CT Angio Chest/Abd/Pel for Dissection W and/or W/WO  Result Date: 03/26/2022 CLINICAL DATA:  Chest pain and shortness of breath EXAM: CT ANGIOGRAPHY CHEST, ABDOMEN AND PELVIS TECHNIQUE: Non-contrast CT of the chest was initially obtained. Multidetector CT imaging through the chest, abdomen and pelvis was performed using the standard protocol during bolus administration of intravenous contrast. Multiplanar reconstructed images and MIPs were obtained and reviewed to evaluate the vascular anatomy. RADIATION DOSE REDUCTION: This exam was performed according  to the departmental dose-optimization program which includes automated exposure control, adjustment of the mA and/or kV according to patient size and/or use of iterative reconstruction technique. CONTRAST:  147m OMNIPAQUE IOHEXOL 350 MG/ML SOLN. 4 hour premedication was administered due to prior contrast allergy. COMPARISON:  Chest x-ray from the previous day. FINDINGS: CTA CHEST FINDINGS  Cardiovascular: Precontrast images demonstrate atherosclerotic calcifications of the aorta. No hyperdense crescent to suggest acute aortic abnormality is noted. Post-contrast images show atherosclerotic changes without aneurysmal dilatation or dissection. No cardiac enlargement is seen. Coronary calcifications are noted. Pulmonary artery as visualized shows no pulmonary emboli. Mediastinum/Nodes: Thoracic inlet is within normal limits. No hilar or mediastinal adenopathy is noted. The esophagus is within normal limits. Lungs/Pleura: Lungs are well aerated bilaterally. No focal infiltrate or sizable effusion is seen. Musculoskeletal: Degenerative changes of the thoracic spine are noted. No acute rib abnormality is seen. Review of the MIP images confirms the above findings. CTA ABDOMEN AND PELVIS FINDINGS VASCULAR Aorta: Atherosclerotic calcifications are noted without aneurysmal dilatation or dissection. Celiac: Patent without evidence of aneurysm, dissection, vasculitis or significant stenosis. SMA: Patent without evidence of aneurysm, dissection, vasculitis or significant stenosis. Renals: Both renal arteries are patent without evidence of aneurysm, dissection, vasculitis, fibromuscular dysplasia or significant stenosis. IMA: Patent without evidence of aneurysm, dissection, vasculitis or significant stenosis. Inflow: Iliacs demonstrate atherosclerotic calcifications. No aneurysmal dilatation or dissection is noted. Veins: No specific venous abnormality is noted. Review of the MIP images confirms the above findings. NON-VASCULAR Hepatobiliary: Gallbladder has been surgically removed. Fatty infiltration of the liver is noted. Pancreas: Unremarkable. No pancreatic ductal dilatation or surrounding inflammatory changes. Spleen: Normal in size without focal abnormality. Adrenals/Urinary Tract: Adrenal glands are within normal limits. Kidneys demonstrate a normal enhancement pattern bilaterally. No renal calculi or  obstructive changes are seen. The ureters are within normal limits. The bladder is well distended. Stomach/Bowel: Scattered diverticular change of the colon is noted. No diverticulitis is seen. The appendix has been surgically removed. Small bowel and stomach are within normal limits. Lymphatic: No sizable lymphadenopathy is noted. Reproductive: Status post hysterectomy. No adnexal masses. Other: No abdominal wall hernia or abnormality. No abdominopelvic ascites. Musculoskeletal: Degenerative changes of lumbar spine are seen. Review of the MIP images confirms the above findings. IMPRESSION: CTA of the chest: No aortic aneurysmal dilatation or dissection is seen. No pulmonary emboli are noted. CTA of the abdomen and pelvis: No acute arterial abnormality is noted. Fatty liver. Diverticulosis without diverticulitis. Electronically Signed   By: MInez CatalinaM.D.   On: 03/26/2022 03:23   DG Chest Port 1 View  Result Date: 03/25/2022 CLINICAL DATA:  Chest pain EXAM: PORTABLE CHEST 1 VIEW COMPARISON:  11/15/2020 FINDINGS: Stable cardiomediastinal contours. Aortic atherosclerosis. No focal airspace consolidation, pleural effusion, or pneumothorax. IMPRESSION: No active disease. Electronically Signed   By: NDavina PokeD.O.   On: 03/25/2022 20:09       Lelaina Oatis M.D. [facility] 03/27/2022, 1:09 PM  Available via Epic secure chat 7am-7pm After 7 pm, please refer to night coverage provider listed on amion.

## 2022-03-27 NOTE — Progress Notes (Signed)
Primary Cardiologist :  Hochrein Subjective:  Denies SSCP, palpitations or Dyspnea   Objective:  Vitals:   03/26/22 2000 03/26/22 2021 03/27/22 0023 03/27/22 0509  BP: (!) 143/107 126/74 (!) 133/55 (!) 139/59  Pulse: 90 90 80 80  Resp: _0 Temp: 98.2 F (36.8 C)  98.2 F (36.8 C) 98.7 F (37.1 C)  TempSrc: Oral  Oral Oral  SpO2: 98%   98%  Weight:      Height:        Intake/Output from previous day:  Intake/Output Summary (Last 24 hours) at 03/27/2022 0801 Last data filed at 03/27/2022 0300 Gross per 24 hour  Intake 993.75 ml  Output --  Net 993.75 ml    Physical Exam: Affect appropriate Healthy:  appears stated age HEENT: normal Neck supple with no adenopathy JVP normal no bruits no thyromegaly Lungs clear with no wheezing and good diaphragmatic motion Heart:  S1/S2 no murmur, no rub, gallop or click PMI normal Abdomen: benighn, BS positve, no tenderness, no AAA no bruit.  No HSM or HJR Distal pulses intact with no bruits No edema Neuro non-focal Skin warm and dry No muscular weakness Right radial A   Lab Results: Basic Metabolic Panel: Recent Labs    03/26/22 0625 03/27/22 0130  NA 136 135  K 3.6 4.4  CL 99 102  CO2 24 22  GLUCOSE 395* 337*  BUN 13 24*  CREATININE 1.00 1.27*  CALCIUM 8.8* 8.7*  MG 1.7 1.9  PHOS 3.7  --    Liver Function Tests: Recent Labs    03/26/22 0625  AST 19  ALT 15  ALKPHOS 52  BILITOT 1.3*  PROT 7.1  ALBUMIN 3.6   No results for input(s): "LIPASE", "AMYLASE" in the last 72 hours. CBC: Recent Labs    03/26/22 0625 03/27/22 0130  WBC 10.8* 20.3*  HGB 12.3 10.5*  HCT 34.7* 30.2*  MCV 88.3 90.4  PLT 189 162   Cardiac Enzymes: No results for input(s): "CKTOTAL", "CKMB", "CKMBINDEX", "TROPONINI" in the last 72 hours. BNP: Invalid input(s): "POCBNP" D-Dimer: No results for input(s): "DDIMER" in the last 72 hours. Hemoglobin A1C: Recent Labs    03/26/22 0625  HGBA1C 7.7*   Fasting Lipid  Panel: Recent Labs    03/27/22 0537  CHOL 103  HDL 35*  LDLCALC 46  TRIG 111  CHOLHDL 2.9   Thyroid Function Tests: No results for input(s): "TSH", "T4TOTAL", "T3FREE", "THYROIDAB" in the last 72 hours.  Invalid input(s): "FREET3" Anemia Panel: No results for input(s): "VITAMINB12", "FOLATE", "FERRITIN", "TIBC", "IRON", "RETICCTPCT" in the last 72 hours.  Imaging: CARDIAC CATHETERIZATION  Result Date: 03/26/2022 Images from the original result were not included.   Mid Cx lesion is 90% stenosed.   Prox LAD to Mid LAD lesion is 95% stenosed.   Previously placed Prox Cx to Mid Cx stent of unknown type is  widely patent.   Previously placed Mid LAD stent of unknown type is  widely patent.   A drug-eluting stent was successfully placed.   A drug-eluting stent was successfully placed.   Post intervention, there is a 0% residual stenosis.   Post intervention, there is a 0% residual stenosis.   Post intervention, there is a 0% residual stenosis. [name] is a 71 y.o. female  [id number] LOCATION:  FACILITY: [facility] PHYSICIAN: Quay Burow, M.D. Mar 12, 1951 DATE OF PROCEDURE:  03/26/2022 DATE OF DISCHARGE: CARDIAC CATHETERIZATION / PCI DES LCX & LAD History obtained from chart review.Karen Basta  [name] is a 71 y.o. female with a hx of CAD (s/p DES to LCx in 02/2007, BMS to LAD and Diagonal in 03/2007, cath in 02/2017 showing 50% stenosis along mid-LAD but not significant by FFR with patent stents in the mid-LAD and mid-LCx), HTN, HLD, Type 2 DM and carotid artery stenosis who is being seen 03/26/2022 for the evaluation of chest pain .  Her enzymes were negative.  EKG showed no acute changes.  Dr. Johnsie Cancel evaluated her and felt that she would benefit from diagnostic coronary angiography. PROCEDURE DESCRIPTION: The patient was brought to the second floor [facility] cath lab in the postabsorptive state.  She was premedicated with IV Versed and fentanyl.  Her right wrist was prepped and shaved in usual sterile  fashion. Xylocaine 1% was used for local anesthesia. A 6 French sheath was inserted into the right radial artery using standard Seldinger technique.  Ultrasound was used to identify the right radial artery and guide access.  Digital image was captured and placed the patient's chart.  The patient received a total of 10,500 units of heparin with an ACT of 275.  Isovue dye is used for the entirety of the case (180 cc of contrast total to patient).  Retrograde aortic, ventricular and pullback pressures were recorded.  Radial cocktail was administered via the SideArm sheath. The patient was loaded with 600 mg of clopidogrel and 20 mg of IV Pepcid.  Using a 6 [place] XB 3 cm guide catheter along with a 0.14 Prowater across the circumflex stenosis just at the distal edge of the previously placed stent.  I predilated with a 2 mm x 12 mm balloon and placed a 2.25 mm x 12 mm long Medtronic Onyx frontier drug-eluting stent deployed at 14 to 16 atm (2.36 mm) resulting in reduction of a 90% fairly focal lesion just beyond the distal edge of the previously placed stent to 0% residual. I then redirected the wire down the LAD across the tight proximal stenosis.  I placed a second Prowater in the diagonal branch to protect it.  I predilated the proximal LAD with the same 2 mm x 12 mm balloon and placed a 2.25 x 12 mm long drug-eluting stent just beyond the origin of the first diagonal branch and deployed at 14 to 16 atm.  I postdilated with a 2.5 x 8 mm long noncompliant balloon at 16 atm (2.56 mm) resulting in reduction of a 95% proximal LAD stenosis just beyond the first diagonal branch to 0% residual.  The first diagonal branch was preserved.  Both wires were removed as was the guide catheter.  The sheath was removed and a Zephyr wristband was placed in the patient's right wrist achieving patent hemostasis.   Successful mid circumflex and proximal LAD PCI drug-eluting stenting in the setting of unstable angina.  The patient will  need to be on 12 months of uninterrupted DAPT.  She will be hydrated overnight and anticipate discharge tomorrow morning. Quay Burow. MD, [facility] 03/26/2022 6:23 PM    CT Angio Chest/Abd/Pel for Dissection W and/or W/WO  Result Date: 03/26/2022 CLINICAL DATA:  Chest pain and shortness of breath EXAM: CT ANGIOGRAPHY CHEST, ABDOMEN AND PELVIS TECHNIQUE: Non-contrast CT of the chest was initially obtained. Multidetector CT imaging through the chest, abdomen and pelvis was performed using the standard protocol during bolus administration of intravenous contrast. Multiplanar reconstructed images and MIPs were obtained and reviewed to evaluate the vascular anatomy. RADIATION DOSE REDUCTION: This exam was performed according  to the departmental dose-optimization program which includes automated exposure control, adjustment of the mA and/or kV according to patient size and/or use of iterative reconstruction technique. CONTRAST:  145m OMNIPAQUE IOHEXOL 350 MG/ML SOLN. 4 hour premedication was administered due to prior contrast allergy. COMPARISON:  Chest x-ray from the previous day. FINDINGS: CTA CHEST FINDINGS Cardiovascular: Precontrast images demonstrate atherosclerotic calcifications of the aorta. No hyperdense crescent to suggest acute aortic abnormality is noted. Post-contrast images show atherosclerotic changes without aneurysmal dilatation or dissection. No cardiac enlargement is seen. Coronary calcifications are noted. Pulmonary artery as visualized shows no pulmonary emboli. Mediastinum/Nodes: Thoracic inlet is within normal limits. No hilar or mediastinal adenopathy is noted. The esophagus is within normal limits. Lungs/Pleura: Lungs are well aerated bilaterally. No focal infiltrate or sizable effusion is seen. Musculoskeletal: Degenerative changes of the thoracic spine are noted. No acute rib abnormality is seen. Review of the MIP images confirms the above findings. CTA ABDOMEN AND PELVIS FINDINGS VASCULAR  Aorta: Atherosclerotic calcifications are noted without aneurysmal dilatation or dissection. Celiac: Patent without evidence of aneurysm, dissection, vasculitis or significant stenosis. SMA: Patent without evidence of aneurysm, dissection, vasculitis or significant stenosis. Renals: Both renal arteries are patent without evidence of aneurysm, dissection, vasculitis, fibromuscular dysplasia or significant stenosis. IMA: Patent without evidence of aneurysm, dissection, vasculitis or significant stenosis. Inflow: Iliacs demonstrate atherosclerotic calcifications. No aneurysmal dilatation or dissection is noted. Veins: No specific venous abnormality is noted. Review of the MIP images confirms the above findings. NON-VASCULAR Hepatobiliary: Gallbladder has been surgically removed. Fatty infiltration of the liver is noted. Pancreas: Unremarkable. No pancreatic ductal dilatation or surrounding inflammatory changes. Spleen: Normal in size without focal abnormality. Adrenals/Urinary Tract: Adrenal glands are within normal limits. Kidneys demonstrate a normal enhancement pattern bilaterally. No renal calculi or obstructive changes are seen. The ureters are within normal limits. The bladder is well distended. Stomach/Bowel: Scattered diverticular change of the colon is noted. No diverticulitis is seen. The appendix has been surgically removed. Small bowel and stomach are within normal limits. Lymphatic: No sizable lymphadenopathy is noted. Reproductive: Status post hysterectomy. No adnexal masses. Other: No abdominal wall hernia or abnormality. No abdominopelvic ascites. Musculoskeletal: Degenerative changes of lumbar spine are seen. Review of the MIP images confirms the above findings. IMPRESSION: CTA of the chest: No aortic aneurysmal dilatation or dissection is seen. No pulmonary emboli are noted. CTA of the abdomen and pelvis: No acute arterial abnormality is noted. Fatty liver. Diverticulosis without diverticulitis.  Electronically Signed   By: MInez CatalinaM.D.   On: 03/26/2022 03:23   DG Chest Port 1 View  Result Date: 03/25/2022 CLINICAL DATA:  Chest pain EXAM: PORTABLE CHEST 1 VIEW COMPARISON:  11/15/2020 FINDINGS: Stable cardiomediastinal contours. Aortic atherosclerosis. No focal airspace consolidation, pleural effusion, or pneumothorax. IMPRESSION: No active disease. Electronically Signed   By: NDavina PokeD.O.   On: 03/25/2022 20:09    Cardiac Studies:  ECG:  NSR no acute changes    Telemetry:  NSR  Echo:   Medications:    amLODipine  10 mg Oral Daily   aspirin  81 mg Oral Daily   atenolol  50 mg Oral BID   clopidogrel  75 mg Oral Q breakfast   enoxaparin (LOVENOX) injection  40 mg Subcutaneous Q24H   furosemide  20 mg Oral Daily   insulin aspart  0-15 Units Subcutaneous TID AC & HS   insulin aspart  10 Units Subcutaneous TID WC   insulin glargine-yfgn  30 Units Subcutaneous QHS  isosorbide mononitrate  60 mg Oral Daily   pantoprazole (PROTONIX) IV  40 mg Intravenous Q24H   rOPINIRole  1 mg Oral QHS   rosuvastatin  20 mg Oral Daily   sertraline  50 mg Oral Daily   sodium chloride flush  3 mL Intravenous Q12H      sodium chloride      Assessment/Plan:   CAD:  cath yesterday resulting in two new stents to LCX and LAD excellent results by Dr Gwenlyn Found angiograms reviewed. DAT ok to d/c home  HTN:  Well controlled.  Continue current medications and low sodium Dash type diet.   DM:  Discussed low carb diet.  Target hemoglobin A1c is 6.5 or less.  Continue current medications. HLD: continue crestor  Jenkins Rouge 03/27/2022, 8:01 AM

## 2022-03-27 NOTE — Plan of Care (Signed)

## 2022-03-27 NOTE — Inpatient Diabetes Management (Signed)
Inpatient Diabetes Program Recommendations  AACE/ADA: New Consensus Statement on Inpatient Glycemic Control (2015)  Target Ranges:  Prepandial:   less than 140 mg/dL      Peak postprandial:   less than 180 mg/dL (1-2 hours)      Critically ill patients:  140 - 180 mg/dL   Lab Results  Component Value Date   GLUCAP 291 (H) 03/27/2022   HGBA1C 7.7 (H) 03/26/2022    Review of Glycemic Control  Diabetes history: DM 2 Outpatient Diabetes medications: Tresiba 30 units, Novolog 20-30 units tid Current orders for Inpatient glycemic control:  Semglee 30 units Novolog 10 units tid Novolog 0-15 units tid  A1c 7.7% on 7/3  Pt received Solumedrol 40 mg yesterday. Glucose trends spiked afterward. Pt reports some hypoglycemia 3-4 times a week. Discussed her current regimen and encouraged her to call her MD office. Discussed recent steroid use and that her trends should return to baseline within 48-72 hours. Told pt to check glucose often. Pt has plenty of supplies at home and reports her next follow up is within 3 months.   Thanks,  Tama Headings RN, MSN, BC-ADM Inpatient Diabetes Coordinator Team Pager [phone] (8a-5p)

## 2022-03-27 NOTE — Care Management (Signed)
1001 03-27-22 Case Manager spoke with patient regarding cost of Plavix for the patient. Rx was sent to [facility] in [place]. Patient's son Otila Kluver can assist the patient with the payment of Plavix. Son will transport patient home. No further needs from Case Manager at this time.

## 2022-03-28 ENCOUNTER — Telehealth: Payer: Self-pay

## 2022-03-28 ENCOUNTER — Telehealth (HOSPITAL_COMMUNITY): Payer: Self-pay

## 2022-03-28 ENCOUNTER — Encounter (HOSPITAL_COMMUNITY): Payer: Self-pay | Admitting: Cardiovascular Disease

## 2022-03-28 LAB — URINE CULTURE: Culture: >=100000 — AB

## 2022-03-28 LAB — LIPOPROTEIN A (LPA): Lipoprotein (a): 12.1 nmol/L (ref <75.0)

## 2022-03-28 NOTE — Telephone Encounter (Signed)
Patient contacted regarding discharge from [facility] on 03/27/2022.  Patient understands to follow up with provider Minus Breeding, MD on 04/04/22 at 1:20 PM at [facility]. Patient understands discharge instructions? Yes Patient understands medications and regiment? Yes Patient understands to bring all medications to this visit? Yes

## 2022-03-28 NOTE — Telephone Encounter (Signed)
Attempted to call pt at 1314 for edu, pt did not answer will f/u later.  Christen Bame 03/28/2022 1:17 PM

## 2022-03-28 NOTE — Telephone Encounter (Signed)
Transition Care Management Follow-up Telephone Call Date of discharge and from where: 03/27/22 - [facility] - chest pain How have you been since you were released from the hospital? Doing fine, just tired Any questions or concerns? No  Items Reviewed: Did the pt receive and understand the discharge instructions provided? Yes  Medications obtained and verified? Yes  Other? No  Any new allergies since your discharge? No  Dietary orders reviewed? Yes Do you have support at home? Yes   Home Care and Equipment/Supplies: Were home health services ordered? no Were any new equipment or medical supplies ordered?  No  Functional Questionnaire: (I = Independent and D = Dependent) ADLs: i  Bathing/Dressing- i  Meal Prep- i  Eating- i  Maintaining continence- i  Transferring/Ambulation- i  Managing Meds- i  Follow up appointments reviewed:  PCP Hospital f/u appt confirmed? Yes  Scheduled to see Marjorie Smolder on 04/05/2022 @ [facility] f/u appt confirmed? Yes  Scheduled to see Hochrein on 04/04/22 @ 1:20. Are transportation arrangements needed? No  If their condition worsens, is the pt aware to call PCP or go to the Emergency Dept.? Yes Was the patient provided with contact information for the PCP's office or ED? Yes Was to pt encouraged to call back with questions or concerns? Yes

## 2022-03-28 NOTE — Telephone Encounter (Signed)
-----   Message from Abigail Butts, PA-C sent at 03/27/2022 10:29 AM EDT ----- Regarding: TOC follow-up Please schedule this patient for a follow-up appointment and call them with that information.  Primary Cardiologist: Dr. Percival [facility] office Date of Discharge: 03/27/2022 Appointment Needed Within: 2 weeks Appointment Type: TOC visit with Dr. Percival Spanish at the [facility] office - may need to check with his RN to see if they can squeeze the patient in 04/04/22 for a TOC visit.   Thank you!  Roby Lofts, PA-C

## 2022-03-29 DIAGNOSIS — Z7982 Long term (current) use of aspirin: Secondary | ICD-10-CM | POA: Diagnosis not present

## 2022-03-29 DIAGNOSIS — I25118 Atherosclerotic heart disease of native coronary artery with other forms of angina pectoris: Secondary | ICD-10-CM | POA: Diagnosis not present

## 2022-03-29 DIAGNOSIS — Z7902 Long term (current) use of antithrombotics/antiplatelets: Secondary | ICD-10-CM | POA: Diagnosis not present

## 2022-03-29 DIAGNOSIS — Z955 Presence of coronary angioplasty implant and graft: Secondary | ICD-10-CM | POA: Diagnosis not present

## 2022-03-30 ENCOUNTER — Telehealth (HOSPITAL_COMMUNITY): Payer: Self-pay | Admitting: *Deleted

## 2022-03-30 DIAGNOSIS — Z955 Presence of coronary angioplasty implant and graft: Secondary | ICD-10-CM

## 2022-03-30 NOTE — Telephone Encounter (Signed)
Called pt at home and discussed stents, Plavix, diet, exercise, NTG and CRPII. Pt receptive. Not interested in CRPII however will place referral to [facility]. [place], ACSM 03/30/2022 1:39 PM

## 2022-04-02 ENCOUNTER — Ambulatory Visit: Payer: Self-pay | Admitting: *Deleted

## 2022-04-02 DIAGNOSIS — E785 Hyperlipidemia, unspecified: Secondary | ICD-10-CM | POA: Insufficient documentation

## 2022-04-02 DIAGNOSIS — Z7982 Long term (current) use of aspirin: Secondary | ICD-10-CM | POA: Diagnosis not present

## 2022-04-02 DIAGNOSIS — I509 Heart failure, unspecified: Secondary | ICD-10-CM

## 2022-04-02 DIAGNOSIS — Z6824 Body mass index (BMI) 24.0-24.9, adult: Secondary | ICD-10-CM | POA: Diagnosis not present

## 2022-04-02 DIAGNOSIS — I10 Essential (primary) hypertension: Secondary | ICD-10-CM | POA: Diagnosis not present

## 2022-04-02 DIAGNOSIS — E1165 Type 2 diabetes mellitus with hyperglycemia: Secondary | ICD-10-CM

## 2022-04-02 DIAGNOSIS — Z515 Encounter for palliative care: Secondary | ICD-10-CM | POA: Diagnosis not present

## 2022-04-02 DIAGNOSIS — R519 Headache, unspecified: Secondary | ICD-10-CM | POA: Diagnosis not present

## 2022-04-02 DIAGNOSIS — I25118 Atherosclerotic heart disease of native coronary artery with other forms of angina pectoris: Secondary | ICD-10-CM | POA: Diagnosis not present

## 2022-04-02 NOTE — Chronic Care Management (AMB) (Signed)
  Chronic Care Management   Note  04/02/2022 Name: [name] MRN: [id number] DOB: Jul 22, 1951  Patient has not recently engaged with the [facility] for [facility]. Removing RN Care Manager from Care Team and closing [place]. I will reach out to PharmD and/or LCSW to inform them of my case closure.  Chong Sicilian, BSN, RN-BC Embedded Chronic Care Manager [facility] [place] Management Direct Dial: [phone]

## 2022-04-02 NOTE — Progress Notes (Addendum)
Cardiology Office Note   Date:  04/04/2022   ID:  [name], DOB 09/19/1951, MRN [id number]  PCP:  Karen Perking, FNP  Cardiologist:   Minus Breeding, MD Referring:  Karen Perking, FNP  Chief Complaint  Patient presents with   Unstable angina       History of Present Illness: [name] is a 71 y.o. female who presents for follow up of CAD.  I have not seen her since 2018.   She has a known history of stents to the LAD and PTCA to diagonal in 2008.    She had non obstructive CAD in 2015.  She was admitted with slurred speech in 2016 and had a negative work up with an unremarkable echo and mild carotid plaque.  POET (Plain Old Exercise Treadmill) was intermediate risk.   I last saw her in 2018.  She had atypical chest pain at that time.  She had a follow-up perfusion study that suggest some possible mild ischemia in the basal inferolateral, mid inferolateral, apical inferior and apical lateral locations.   She had a cath at that time and had patent stents as below.   Since I saw her she was in the hospital earlier this month.  She had unstable angina with restenosis of the LAD proximal to the previously placed stent.  She had stenting of this.  I reviewed the records for this visit.  Since then she has had none of the substernal or back discomfort that she had is her unstable angina.  She did very fatigued.  I do note that she was taken off Celexa apparently because the QT was prolonged.  She is just been really tired since then.  She denies any chest pressure, neck or arm discomfort.  She denies any shortness of breath, PND or orthopnea.  She said no palpitations, presyncope or syncope.  She has had no weight gain or edema   Past Medical History:  Diagnosis Date   Anxiety    CAD (coronary artery disease)    DES to circumflex 02/2007, BMS to LAD and PTCA diagonal 03/2007   Carotid artery plaque    Mild   Cataract    Depression    Diverticulitis, colon    Elevated  d-dimer 01/08/2014   Essential hypertension, benign    GERD (gastroesophageal reflux disease)    H/O hiatal hernia    HLD (hyperlipidemia)    IDDM (insulin dependent diabetes mellitus)    Migraine    "used to have them really bad; don't have them anymore" (01/07/2014)   MS (multiple sclerosis) ([place])    Not confirmed   PAT (paroxysmal atrial tachycardia) (HCC)    Prolapse of uterus    PVD (peripheral vascular disease) (HCC)    TIA (transient ischemic attack) 1980's    Past Surgical History:  Procedure Laterality Date   ABDOMINAL HYSTERECTOMY  1986   ovaries remain - prolaspe uterus    APPENDECTOMY  ~ 1970   BREAST BIOPSY Right 1980's   BREAST LUMPECTOMY Right 1980's   Dr. Charlynne Pander    CARDIAC CATHETERIZATION  01/07/2014   CHOLECYSTECTOMY  ?1987   COLONOSCOPY  2002   Dr. Anwar--> Severe diverticular changes in the region of the sigmoid and descending colon with scattered diverticular changes throughout the rest of the colon. No polyps, ulcerations. Despite numerous manipulations, the tip of the scope could not be tipped into the cecal area.   COLONOSCOPY  01/10/2012   Procedure: COLONOSCOPY;  Surgeon: Daneil Dolin, MD;  [facility];  Service: Endoscopy;  Laterality: N/A;  1:55   CORONARY ANGIOPLASTY WITH STENT PLACEMENT  ~ 1997 X 2   "2 + 1"   CORONARY STENT INTERVENTION N/A 03/26/2022   Procedure: CORONARY STENT INTERVENTION;  Surgeon: Lorretta Harp, MD;  Location: [facility];  Service: Cardiovascular;  Laterality: N/A;   EYE SURGERY Bilateral 2014   cataract   INTRAVASCULAR PRESSURE WIRE/FFR STUDY N/A 03/08/2017   Procedure: Intravascular Pressure Wire/FFR Study;  Surgeon: Nelva Bush, MD;  Location: [facility];  Service: Cardiovascular;  Laterality: N/A;   LEFT HEART CATH AND CORONARY ANGIOGRAPHY N/A 03/08/2017   Procedure: Left Heart Cath and Coronary Angiography;  Surgeon: Nelva Bush, MD;  Location: [facility];  Service:  Cardiovascular;  Laterality: N/A;   LEFT HEART CATH AND CORONARY ANGIOGRAPHY N/A 03/26/2022   Procedure: LEFT HEART CATH AND CORONARY ANGIOGRAPHY;  Surgeon: Lorretta Harp, MD;  Location: [facility];  Service: Cardiovascular;  Laterality: N/A;   LEFT HEART CATHETERIZATION WITH CORONARY ANGIOGRAM N/A 01/07/2014   Procedure: LEFT HEART CATHETERIZATION WITH CORONARY ANGIOGRAM;  Surgeon: Larey Dresser, MD;  Location: [facility];  Service: Cardiovascular;  Laterality: N/A;     Current Outpatient Medications  Medication Sig Dispense Refill   amLODipine (NORVASC) 10 MG tablet Take 1 tablet (10 mg total) by mouth daily. 90 tablet 3   aspirin 81 MG chewable tablet Chew 1 tablet (81 mg total) by mouth daily. 90 tablet 3   atenolol (TENORMIN) 50 MG tablet Take 1 tablet (50 mg total) by mouth 2 (two) times daily. 180 tablet 3   clopidogrel (PLAVIX) 75 MG tablet Take 1 tablet (75 mg total) by mouth daily with breakfast. 90 tablet 3   furosemide (LASIX) 20 MG tablet TAKE ONE (1) TABLET EACH DAY (Patient taking differently: Take 20 mg by mouth daily.) 90 tablet 0   insulin degludec (TRESIBA FLEXTOUCH) 100 UNIT/ML FlexTouch Pen Inject 60 Units into the skin daily.     isosorbide mononitrate (IMDUR) 60 MG 24 hr tablet Take 1 tablet (60 mg total) by mouth daily. 90 tablet 3   nitroGLYCERIN (NITROSTAT) 0.4 MG SL tablet DISSOLVE 1 TABLET UNDER TONGUE FOR CHESTPAIN.MAY REPEAT EVERY 5 MINUTES FOR 3 DOSES.IF NO RELIEF CALL 911 OR GO TO ER 25 tablet 3   NOVOLOG FLEXPEN 100 UNIT/ML FlexPen INJECT 35-40 UNITS SQ 3 TIMES DAILY WITH MEALS (Patient taking differently: Inject 20-30 Units into the skin 3 (three) times daily with meals.) 30 mL 3   rOPINIRole (REQUIP) 0.5 MG tablet Take 2 tablets (1 mg total) by mouth at bedtime. (Patient taking differently: Take 1 mg by mouth daily as needed (restless leg).) 180 tablet 2   rosuvastatin (CRESTOR) 20 MG tablet Take 1 tablet (20 mg total) by mouth daily. 90 tablet 1    Semaglutide, 1 MG/DOSE, (OZEMPIC, 1 MG/DOSE,) 2 MG/1.5ML SOPN Inject 1 mg into the skin once a week.     sertraline (ZOLOFT) 50 MG tablet Take 1 tablet (50 mg total) by mouth daily. 30 tablet 3   traZODone (DESYREL) 100 MG tablet Take 1 tablet (100 mg total) by mouth at bedtime as needed for sleep. 90 tablet 1   Continuous Blood Gluc Receiver (FREESTYLE LIBRE 2 READER) DEVI USE TO TEST BLOOD SUGAR 6 TIMES DAILY. Dx:E11.65 1 each 0   Continuous Blood Gluc Sensor (FREESTYLE LIBRE 2 SENSOR) MISC USE TO TEST BLOOD SUGAR  6 TIMES DAILY 2 each 11   No current facility-administered medications for this visit.    Allergies:   Iohexol, Ticlid [ticlopidine hcl], Jardiance [empagliflozin], Metformin and related, and Codeine    ROS:  Please see the history of present illness.   Otherwise, review of systems are positive for none 6 months.   All other systems are reviewed and negative.    PHYSICAL EXAM: VS:  BP 110/60   Pulse 81   Ht '5\' 6"'$  (1.676 m)   Wt 159 lb (72.1 kg)   BMI 25.66 kg/m  , BMI Body mass index is 25.66 kg/m. GENERAL:  Well appearing NECK:  No jugular venous distention, waveform within normal limits, carotid upstroke brisk and symmetric, no bruits, no thyromegaly LUNGS:  Clear to auscultation bilaterally CHEST:  Unremarkable HEART:  PMI not displaced or sustained,S1 and S2 within normal limits, no S3, no S4, no clicks, no rubs, no murmurs ABD:  Flat, positive bowel sounds normal in frequency in pitch, no bruits, no rebound, no guarding, no midline pulsatile mass, no hepatomegaly, no splenomegaly EXT:  2 plus pulses throughout, no edema, no cyanosis no clubbing, right radial with mild bruising   EKG:  EKG is  ordered today. The ekg ordered today demonstrates sinus rhythm, low voltage in the limb leads, poor anterior R wave progression, no acute ST-T wave changes.  No change from previous.  The QTC is mildly prolonged   Recent Labs: 12/11/2021: TSH 2.110 03/26/2022: ALT  15 03/27/2022: BUN 24; Creatinine, Ser 1.27; Hemoglobin 10.5; Magnesium 1.9; Platelets 162; Potassium 4.4; Sodium 135    Lipid Panel    Component Value Date/Time   CHOL 103 03/27/2022 0537   CHOL 222 (H) 03/12/2022 0811   CHOL 107 02/05/2013 1002   TRIG 111 03/27/2022 0537   TRIG 149 11/30/2015 0947   TRIG 143 02/05/2013 1002   HDL 35 (L) 03/27/2022 0537   HDL 35 (L) 03/12/2022 0811   HDL 35 (L) 11/30/2015 0947   HDL 35 (L) 02/05/2013 1002   CHOLHDL 2.9 03/27/2022 0537   VLDL 22 03/27/2022 0537   LDLCALC 46 03/27/2022 0537   LDLCALC 159 (H) 03/12/2022 0811   LDLCALC 195 (H) 04/26/2014 1029   LDLCALC 43 02/05/2013 1002   LDLDIRECT 93 04/06/2015 1038      Wt Readings from Last 3 Encounters:  04/04/22 159 lb (72.1 kg)  03/25/22 160 lb (72.6 kg)  03/12/22 161 lb 8 oz (73.3 kg)     Cardiac cath:  July 2023  Diagnostic Dominance: Right  Intervention    Dominance: Right   Other studies Reviewed: Additional studies/ records that were reviewed today include: Hospital records Review of the above records demonstrates:  Please see elsewhere in the note.     ASSESSMENT AND PLAN:   CAD: She is status post PCI earlier this month as above.   The patient has no new sypmtoms.  No further cardiovascular testing is indicated.  We will continue with aggressive risk reduction and meds as listed.    HTN:  Her blood pressure is controlled and I reviewed blood pressure diary.  No change in therapy.    CAROTID STENOSIS:   This was mild.  No change in therapy.   HYPERLIPIDEMIA:     Her lipids are excellent with an LDL of 46.  No change in therapy.    DM:  A1C was 7.7.  This is improved from 8.8.  No change in therapy.    Fatigue: This may  be related to her having discontinued her Celexa which I think may have been done because of her prolonged QT.  I have asked her to talk with her primary provider about taking another medication that might not affect this interval.  Current  medicines are reviewed at length with the patient today.  The patient does not have concerns regarding medicines.  The following changes have been made: None  Labs/ tests ordered today include: None  Orders Placed This Encounter  Procedures   EKG 12-Lead      Disposition:   FU with me 6 months.      Signed, Minus Breeding, MD  04/04/2022 2:12 PM    [facility]

## 2022-04-04 ENCOUNTER — Encounter: Payer: Self-pay | Admitting: Cardiology

## 2022-04-04 ENCOUNTER — Ambulatory Visit: Payer: PPO | Admitting: Cardiology

## 2022-04-04 VITALS — BP 110/60 | HR 81 | Ht 66.0 in | Wt 159.0 lb

## 2022-04-04 DIAGNOSIS — I25119 Atherosclerotic heart disease of native coronary artery with unspecified angina pectoris: Secondary | ICD-10-CM

## 2022-04-04 DIAGNOSIS — E785 Hyperlipidemia, unspecified: Secondary | ICD-10-CM

## 2022-04-04 DIAGNOSIS — I10 Essential (primary) hypertension: Secondary | ICD-10-CM | POA: Diagnosis not present

## 2022-04-04 NOTE — Patient Instructions (Signed)
Medication Instructions:  The current medical regimen is effective;  continue present plan and medications.  *If you need a refill on your cardiac medications before your next appointment, please call your pharmacy*  Follow-Up: At [facility], you and your health needs are our priority.  As part of our continuing mission to provide you with exceptional heart care, we have created designated Provider Care Teams.  These Care Teams include your primary Cardiologist (physician) and Advanced Practice Providers (APPs -  Physician Assistants and Nurse Practitioners) who all work together to provide you with the care you need, when you need it.  We recommend signing up for the patient portal called "MyChart".  Sign up information is provided on this After Visit Summary.  MyChart is used to connect with patients for Virtual Visits (Telemedicine).  Patients are able to view lab/test results, encounter notes, upcoming appointments, etc.  Non-urgent messages can be sent to your provider as well.   To learn more about what you can do with MyChart, go to [email or web address].    Your next appointment:   6 month(s)  The format for your next appointment:   In Person  Provider:   Minus Breeding, MD{    Important Information About Sugar

## 2022-04-05 ENCOUNTER — Ambulatory Visit: Payer: PPO | Admitting: Family Medicine

## 2022-04-06 ENCOUNTER — Ambulatory Visit: Payer: PPO | Admitting: General Practice

## 2022-04-06 ENCOUNTER — Telehealth: Payer: Self-pay | Admitting: Family Medicine

## 2022-04-06 ENCOUNTER — Ambulatory Visit (INDEPENDENT_AMBULATORY_CARE_PROVIDER_SITE_OTHER): Payer: PPO | Admitting: Family Medicine

## 2022-04-06 ENCOUNTER — Encounter: Payer: Self-pay | Admitting: Family Medicine

## 2022-04-06 VITALS — BP 130/70 | HR 80 | Temp 97.7°F | Resp 20 | Ht 66.0 in | Wt 161.0 lb

## 2022-04-06 DIAGNOSIS — I152 Hypertension secondary to endocrine disorders: Secondary | ICD-10-CM

## 2022-04-06 DIAGNOSIS — F339 Major depressive disorder, recurrent, unspecified: Secondary | ICD-10-CM | POA: Diagnosis not present

## 2022-04-06 DIAGNOSIS — E1165 Type 2 diabetes mellitus with hyperglycemia: Secondary | ICD-10-CM

## 2022-04-06 DIAGNOSIS — E785 Hyperlipidemia, unspecified: Secondary | ICD-10-CM | POA: Diagnosis not present

## 2022-04-06 DIAGNOSIS — Z09 Encounter for follow-up examination after completed treatment for conditions other than malignant neoplasm: Secondary | ICD-10-CM

## 2022-04-06 DIAGNOSIS — I25110 Atherosclerotic heart disease of native coronary artery with unstable angina pectoris: Secondary | ICD-10-CM

## 2022-04-06 DIAGNOSIS — R9431 Abnormal electrocardiogram [ECG] [EKG]: Secondary | ICD-10-CM

## 2022-04-06 DIAGNOSIS — F411 Generalized anxiety disorder: Secondary | ICD-10-CM

## 2022-04-06 DIAGNOSIS — E1159 Type 2 diabetes mellitus with other circulatory complications: Secondary | ICD-10-CM

## 2022-04-06 DIAGNOSIS — E1169 Type 2 diabetes mellitus with other specified complication: Secondary | ICD-10-CM | POA: Diagnosis not present

## 2022-04-06 MED ORDER — SERTRALINE HCL 100 MG PO TABS: 100.0000 mg | ORAL_TABLET | Freq: Every day | ORAL | 1 refills | Status: DC

## 2022-04-06 NOTE — Progress Notes (Signed)
Established Patient Office Visit  Subjective   Patient ID: [name], female    DOB: 05-23-1951  Age: 71 y.o. MRN: [id number]  Chief Complaint  Patient presents with   Transitions Of Care    HPI  Today's visit was for Transitional Care Management.  The patient was discharged from [facility] on 03/27/22 with a primary diagnosis of CAD with unstable angina.   Contact with the patient and/or caregiver, by a clinical staff member, was made on 03/28/22 and was documented as a telephone encounter within the EMR.  Through chart review and discussion with the patient I have determined that management of their condition is of moderate complexity.   [name] was admitted to [facility] on 03/25/22 for chest pain. She was found to have 2 blockages and underwent stent placement. Recommened to continue amlodipine, atenolol, and lasix. Her LDL was well controlled at 46 with crestor. Her a1c was 7.7. She was switched from celexa to zoloft due her QT prolongation. She had a follow up with cardiology 2 days ago. Dr. Percival Spanish felt that she was stable and no further imaging was required.   She reports that she has felt down and very tired since her discharge. She denies chest pain, shortness of breath, orthopnea, edema, visual disturbances, HA, or focal weakness. She reports that her blood sugars have been liable. She reports her blood sugars have 105-110 fasting.      04/06/2022   10:31 AM 03/12/2022    7:58 AM 01/24/2022    8:22 AM  Depression screen PHQ 2/9  Decreased Interest _0 Down, Depressed, Hopeless 3 1 0  PHQ - 2 Score _1 Altered sleeping _2 Tired, decreased energy _3 Change in appetite _4 Feeling bad or failure about yourself  1 0 0  Trouble concentrating _5 Moving slowly or fidgety/restless 2 0 3  Suicidal thoughts 0 0 0  PHQ-9 Score _6 Difficult doing work/chores Somewhat difficult Somewhat difficult Very difficult      04/06/2022   10:32 AM 03/12/2022    7:59 AM  01/24/2022    8:22 AM 01/15/2022    8:07 AM  GAD 7 : Generalized Anxiety Score  Nervous, Anxious, on Edge _7 Control/stop worrying _8 Worry too much - different things 3 3 0 3  Trouble relaxing _9 Restless _10 Easily annoyed or irritable _11 Afraid - awful might happen 3 0 0 0  Total GAD 7 Score _12 Anxiety Difficulty Very difficult Somewhat difficult Very difficult Very difficult    Past Medical History:  Diagnosis Date   Anxiety    CAD (coronary artery disease)    DES to circumflex 02/2007, BMS to LAD and PTCA diagonal 03/2007   Carotid artery plaque    Mild   Cataract    Depression    Diverticulitis, colon    Elevated d-dimer 01/08/2014   Essential hypertension, benign    GERD (gastroesophageal reflux disease)    H/O hiatal hernia    HLD (hyperlipidemia)    IDDM (insulin dependent diabetes mellitus)    Migraine    "used to have them really bad; don't have them anymore" (01/07/2014)   MS (multiple sclerosis) ([place])    Not confirmed   PAT (paroxysmal atrial tachycardia) ([place])  Prolapse of uterus    PVD (peripheral vascular disease) (HCC)    TIA (transient ischemic attack) 1980's      ROS As per HPI.    Objective:     BP 130/70   Pulse 80   Temp 97.7 F (36.5 C) (Temporal)   Resp 20   Ht 5' 6" (1.676 m)   Wt 161 lb (73 kg)   SpO2 98%   BMI 25.99 kg/m  BP Readings from Last 3 Encounters:  04/06/22 130/70  04/04/22 110/60  03/27/22 135/60      Physical Exam Vitals and nursing note reviewed.  Constitutional:      General: She is not in acute distress.    Appearance: She is not ill-appearing, toxic-appearing or diaphoretic.  HENT:     Head: Normocephalic and atraumatic.  Eyes:     Extraocular Movements: Extraocular movements intact.     Pupils: Pupils are equal, round, and reactive to light.  Neck:     Vascular: No carotid bruit.  Cardiovascular:     Rate and Rhythm: Normal rate and regular rhythm.     Heart  sounds: Normal heart sounds. No murmur heard. Pulmonary:     Effort: Pulmonary effort is normal. No respiratory distress.     Breath sounds: Normal breath sounds.  Abdominal:     General: Bowel sounds are normal. There is no distension.     Palpations: Abdomen is soft.     Tenderness: There is no abdominal tenderness. There is no guarding or rebound.  Musculoskeletal:     Cervical back: Neck supple.     Right lower leg: No edema.     Left lower leg: No edema.  Skin:    General: Skin is warm and dry.  Neurological:     General: No focal deficit present.     Mental Status: She is alert.     Motor: No weakness.     Gait: Gait normal.  Psychiatric:        Mood and Affect: Mood normal.        Behavior: Behavior normal.    No results found for any visits on 04/06/22.    The ASCVD Risk score (Arnett DK, et al., 2019) failed to calculate for the following reasons:   The valid total cholesterol range is 130 to 320 mg/dL    Assessment & Plan:   [name] was seen today for transitions of care.  Diagnoses and all orders for this visit:  Coronary artery disease involving native coronary artery of native heart with unstable angina pectoris ([name]) S/p stent placement. Followed up with cardiology 2 days ago. Denies symptoms. On amlodipine, aspirin, atenolol, plavix, lasix, and imdur. -     CBC with Differential/Platelet -     BMP8+EGFR  Hypertension associated with diabetes ([name]) Well controlled on current regimen.  -     CBC with Differential/Platelet -     BMP8+EGFR  Type 2 diabetes mellitus with hyperglycemia, without long-term current use of insulin (HCC) Last a1c was 7.7. Improving. Discussed compliance with meal time insulin.  -     CBC with Differential/Platelet -     BMP8+EGFR  Hyperlipidemia associated with type 2 diabetes mellitus ([name]) Well controlled.   Depression, recurrent ([name]) Generalized anxiety disorder Uncontrolled. Denies SI. Changed from celexa to zoloft due  to QT prolongation. Increase zoloft to 100 mg daily.  -     sertraline (ZOLOFT) 100 MG tablet; Take 1 tablet (100 mg total) by mouth daily.  QT prolongation  Hospital discharge follow-up Reviewed discharge summary.  -     CBC with Differential/Platelet -     BMP8+EGFR  Return in about 4 weeks (around 05/04/2022) for medication follow up.   The patient indicates understanding of these issues and agrees with the plan.  Gwenlyn Perking, FNP

## 2022-04-06 NOTE — Telephone Encounter (Signed)
Pt was this am and wants to know which medication was going to be sent in to pharm and which she is suppose to stop taking.  Pt went to the drug store in [place] but no rx was ready for her. Please call back

## 2022-04-07 LAB — CBC WITH DIFFERENTIAL/PLATELET
Basophils Absolute: 0 10*3/uL (ref 0.0–0.2)
Basos: 1 %
EOS (ABSOLUTE): 0.1 10*3/uL (ref 0.0–0.4)
Eos: 1 %
Hematocrit: 34.6 % (ref 34.0–46.6)
Hemoglobin: 11.6 g/dL (ref 11.1–15.9)
Immature Grans (Abs): 0 10*3/uL (ref 0.0–0.1)
Immature Granulocytes: 0 %
Lymphocytes Absolute: 2.3 10*3/uL (ref 0.7–3.1)
Lymphs: 30 %
MCH: 30.2 pg (ref 26.6–33.0)
MCHC: 33.5 g/dL (ref 31.5–35.7)
MCV: 90 fL (ref 79–97)
Monocytes Absolute: 0.4 10*3/uL (ref 0.1–0.9)
Monocytes: 6 %
Neutrophils Absolute: 4.8 10*3/uL (ref 1.4–7.0)
Neutrophils: 62 %
Platelets: 200 10*3/uL (ref 150–450)
RBC: 3.84 x10E6/uL (ref 3.77–5.28)
RDW: 13.7 % (ref 11.7–15.4)
WBC: 7.7 10*3/uL (ref 3.4–10.8)

## 2022-04-07 LAB — BMP8+EGFR
BUN/Creatinine Ratio: 9 — ABNORMAL LOW (ref 12–28)
BUN: 9 mg/dL (ref 8–27)
CO2: 27 mmol/L (ref 20–29)
Calcium: 8.9 mg/dL (ref 8.7–10.3)
Chloride: 101 mmol/L (ref 96–106)
Creatinine, Ser: 1.01 mg/dL — ABNORMAL HIGH (ref 0.57–1.00)
Glucose: 148 mg/dL — ABNORMAL HIGH (ref 70–99)
Potassium: 3.6 mmol/L (ref 3.5–5.2)
Sodium: 142 mmol/L (ref 134–144)
eGFR: 59 mL/min/{1.73_m2} — ABNORMAL LOW (ref >59)

## 2022-04-09 ENCOUNTER — Ambulatory Visit: Payer: PPO | Admitting: Family Medicine

## 2022-04-10 ENCOUNTER — Telehealth: Payer: Self-pay | Admitting: Family Medicine

## 2022-04-10 NOTE — Telephone Encounter (Signed)
Patient states that yesterday she fell and her leg is sore today. She did report that she gained 3 lbs overnight. She really wants to see Tiffany. Okay to wait until Thursday to see Tiffany or does she need evaluation tomorrow.

## 2022-04-10 NOTE — Telephone Encounter (Signed)
Appointment made with Tiffany July 20th, Thursday at 9:05am.

## 2022-04-12 ENCOUNTER — Encounter: Payer: Self-pay | Admitting: Family Medicine

## 2022-04-12 ENCOUNTER — Ambulatory Visit (INDEPENDENT_AMBULATORY_CARE_PROVIDER_SITE_OTHER): Payer: PPO | Admitting: Family Medicine

## 2022-04-12 VITALS — BP 135/69 | HR 82 | Temp 98.5°F | Ht 66.0 in | Wt 158.5 lb

## 2022-04-12 DIAGNOSIS — M79671 Pain in right foot: Secondary | ICD-10-CM | POA: Diagnosis not present

## 2022-04-12 DIAGNOSIS — R609 Edema, unspecified: Secondary | ICD-10-CM

## 2022-04-12 DIAGNOSIS — I509 Heart failure, unspecified: Secondary | ICD-10-CM | POA: Diagnosis not present

## 2022-04-12 MED ORDER — FUROSEMIDE 40 MG PO TABS: 40.0000 mg | ORAL_TABLET | Freq: Every day | ORAL | 3 refills | Status: DC

## 2022-04-12 NOTE — Patient Instructions (Signed)
Come in 1 week just for labs. Just check in at the front desk, you do not need to make an appointment.

## 2022-04-12 NOTE — Progress Notes (Signed)
Acute Office Visit  Subjective:     Patient ID: [name], female    DOB: Mar 09, 1951, 71 y.o.   MRN: [id number]  Chief Complaint  Patient presents with   Foot Pain    HPI Patient is in today for increased swelling her feet. She reports that the swelling in her feet and lower legs has been worsening. She wakes up without swelling but reports significant swelling as the day progresses. She also has bruising to the top of her right foot. She does not recall a specific injury to this, however she did have a recent fall getting out of the recliner. She reports that her BP has been well controlled. She used to take 40 mg of lasix but this was decreased to 20 mg months ago when she was having soft BPs. Denies chest pain, shortness of breath, orthopnea, focal weakness, or fever. Denies head injury or LOC with recent fall.      04/12/2022    9:26 AM 04/06/2022   10:31 AM 03/12/2022    7:58 AM  Depression screen PHQ 2/9  Decreased Interest _0 Down, Depressed, Hopeless _1 PHQ - 2 Score _2 Altered sleeping _3 Tired, decreased energy _4 Change in appetite _5 Feeling bad or failure about yourself  0 1 0  Trouble concentrating _6 Moving slowly or fidgety/restless 0 2 0  Suicidal thoughts 0 0 0  PHQ-9 Score _7 Difficult doing work/chores Extremely dIfficult Somewhat difficult Somewhat difficult      04/12/2022    9:28 AM 04/06/2022   10:32 AM 03/12/2022    7:59 AM 01/24/2022    8:22 AM  GAD 7 : Generalized Anxiety Score  Nervous, Anxious, on Edge _8 Control/stop worrying _9 Worry too much - different things _10 0  Trouble relaxing _11 Restless _12 Easily annoyed or irritable _13 Afraid - awful might happen 0 3 0 0  Total GAD 7 Score _14 Anxiety Difficulty Very difficult Very difficult Somewhat difficult Very difficult     ROS As per HPI.      Objective:    BP 135/69   Pulse 82   Temp 98.5 F (36.9 C)  (Temporal)   Ht 5' 6" (1.676 m)   Wt 158 lb 8 oz (71.9 kg)   SpO2 97%   BMI 25.58 kg/m    Physical Exam Vitals and nursing note reviewed.  Constitutional:      General: She is not in acute distress.    Appearance: She is not ill-appearing, toxic-appearing or diaphoretic.  Neck:     Vascular: No carotid bruit or JVD.  Cardiovascular:     Rate and Rhythm: Normal rate and regular rhythm.     Heart sounds: Normal heart sounds. No murmur heard.    No friction rub. No gallop. No S3 or S4 sounds.  Pulmonary:     Effort: Pulmonary effort is normal. No respiratory distress.     Breath sounds: Normal breath sounds. No wheezing.  Abdominal:     General: Bowel sounds are normal. There is no distension.     Palpations: Abdomen is soft.     Tenderness: There is no abdominal tenderness.  Musculoskeletal:     Right lower leg:  1+ Edema present.     Left lower leg: 1+ Edema present.     Right foot: Normal capillary refill. Swelling (mild generalized) present. No tenderness or bony tenderness.     Left foot: Normal capillary refill. Swelling (mild generalized) present. No tenderness or bony tenderness.     Comments: Bruising to right proximal lateral dorsal foot.   Skin:    General: Skin is warm and dry.  Neurological:     General: No focal deficit present.     Mental Status: She is alert and oriented to person, place, and time.  Psychiatric:        Mood and Affect: Mood normal.        Behavior: Behavior normal.     No results found for any visits on 04/12/22.      Assessment & Plan:   [name] was seen today for foot pain.  Diagnoses and all orders for this visit:  Chronic congestive heart failure, unspecified heart failure type ([place]) Peripheral edema Will increase lasix to 40 mg daily. Elevated legs, low salt diet. Return in 1 week to check BMP.  -     furosemide (LASIX) 40 MG tablet; Take 1 tablet (40 mg total) by mouth daily. -     BMP8+EGFR; Future  Right foot  pain Discussed possibility of injury with recent fall. No concern for fracture. Elevate, ice, tylenol.    Keep scheduled follow up appointment.   Return to office for new or worsening symptoms, or if symptoms persist.   Gwenlyn Perking, FNP

## 2022-04-13 ENCOUNTER — Encounter: Payer: Self-pay | Admitting: Family Medicine

## 2022-04-19 ENCOUNTER — Other Ambulatory Visit: Payer: PPO

## 2022-04-19 DIAGNOSIS — R609 Edema, unspecified: Secondary | ICD-10-CM | POA: Diagnosis not present

## 2022-04-20 LAB — BMP8+EGFR
BUN/Creatinine Ratio: 8 — ABNORMAL LOW (ref 12–28)
BUN: 9 mg/dL (ref 8–27)
CO2: 27 mmol/L (ref 20–29)
Calcium: 9.3 mg/dL (ref 8.7–10.3)
Chloride: 97 mmol/L (ref 96–106)
Creatinine, Ser: 1.08 mg/dL — ABNORMAL HIGH (ref 0.57–1.00)
Glucose: 136 mg/dL — ABNORMAL HIGH (ref 70–99)
Potassium: 3.8 mmol/L (ref 3.5–5.2)
Sodium: 143 mmol/L (ref 134–144)
eGFR: 54 mL/min/{1.73_m2} — ABNORMAL LOW (ref >59)

## 2022-05-07 ENCOUNTER — Encounter: Payer: Self-pay | Admitting: Family Medicine

## 2022-05-07 ENCOUNTER — Other Ambulatory Visit: Payer: Self-pay | Admitting: Family Medicine

## 2022-05-07 ENCOUNTER — Ambulatory Visit (INDEPENDENT_AMBULATORY_CARE_PROVIDER_SITE_OTHER): Payer: PPO | Admitting: Family Medicine

## 2022-05-07 VITALS — BP 102/68 | HR 92 | Temp 98.4°F | Ht 66.0 in | Wt 153.4 lb

## 2022-05-07 DIAGNOSIS — E785 Hyperlipidemia, unspecified: Secondary | ICD-10-CM | POA: Diagnosis not present

## 2022-05-07 DIAGNOSIS — I152 Hypertension secondary to endocrine disorders: Secondary | ICD-10-CM | POA: Diagnosis not present

## 2022-05-07 DIAGNOSIS — N3001 Acute cystitis with hematuria: Secondary | ICD-10-CM

## 2022-05-07 DIAGNOSIS — I25119 Atherosclerotic heart disease of native coronary artery with unspecified angina pectoris: Secondary | ICD-10-CM | POA: Diagnosis not present

## 2022-05-07 DIAGNOSIS — F339 Major depressive disorder, recurrent, unspecified: Secondary | ICD-10-CM | POA: Diagnosis not present

## 2022-05-07 DIAGNOSIS — N1831 Chronic kidney disease, stage 3a: Secondary | ICD-10-CM

## 2022-05-07 DIAGNOSIS — I509 Heart failure, unspecified: Secondary | ICD-10-CM

## 2022-05-07 DIAGNOSIS — E1169 Type 2 diabetes mellitus with other specified complication: Secondary | ICD-10-CM | POA: Diagnosis not present

## 2022-05-07 DIAGNOSIS — E1159 Type 2 diabetes mellitus with other circulatory complications: Secondary | ICD-10-CM

## 2022-05-07 DIAGNOSIS — F411 Generalized anxiety disorder: Secondary | ICD-10-CM

## 2022-05-07 DIAGNOSIS — R8290 Unspecified abnormal findings in urine: Secondary | ICD-10-CM

## 2022-05-07 DIAGNOSIS — E1165 Type 2 diabetes mellitus with hyperglycemia: Secondary | ICD-10-CM

## 2022-05-07 LAB — MICROSCOPIC EXAMINATION
Renal Epithel, UA: NONE SEEN /hpf
WBC, UA: >30 /hpf — AB (ref 0–5)

## 2022-05-07 LAB — URINALYSIS, ROUTINE W REFLEX MICROSCOPIC
Bilirubin, UA: NEGATIVE
Glucose, UA: NEGATIVE
Ketones, UA: NEGATIVE
Nitrite, UA: POSITIVE — AB
Specific Gravity, UA: 1.02 (ref 1.005–1.030)
Urobilinogen, Ur: 2 mg/dL — ABNORMAL HIGH (ref 0.2–1.0)
pH, UA: 6 (ref 5.0–7.5)

## 2022-05-07 MED ORDER — SERTRALINE HCL 50 MG PO TABS: 50.0000 mg | ORAL_TABLET | Freq: Every day | ORAL | 1 refills | Status: DC

## 2022-05-07 MED ORDER — CEPHALEXIN 500 MG PO CAPS: 500.0000 mg | ORAL_CAPSULE | Freq: Two times a day (BID) | ORAL | 0 refills | Status: DC

## 2022-05-07 NOTE — Progress Notes (Signed)
Established Patient Office Visit  Subjective   Patient ID: [name], female    DOB: 09-Dec-1950  Age: 71 y.o. MRN: [id number]  Chief Complaint  Patient presents with   Depression    Depression        [name] is here for a follow up of anxiety and depression. She unfortunately has had a lot of family issues recently that have been very stressful for her. She isn't sure that she has been taking zoloft and when she called her pharmacist she was told that she has not picked this up in the last year.   She also would like to know when she is due to see Almyra Free again. She has not seen her in the last 3 months.   She has noticed a bad odor to her urine. Denies fever, chills, abdominal pain, urgency, frequency, flank pain, or dysuria. She has not been staying well hydrated lately. There is a history of recurrent UTIs.      05/07/2022    8:02 AM 04/12/2022    9:26 AM 04/06/2022   10:31 AM  Depression screen PHQ 2/9  Decreased Interest 2 3 3   Down, Depressed, Hopeless 2 3 3   PHQ - 2 Score 4 6 6   Altered sleeping 2 3 3   Tired, decreased energy 2 3 3   Change in appetite 2 3 3   Feeling bad or failure about yourself  0 0 1  Trouble concentrating 2 3 3   Moving slowly or fidgety/restless 1 0 2  Suicidal thoughts 0 0 0  PHQ-9 Score 13 18 21   Difficult doing work/chores Very difficult Extremely dIfficult Somewhat difficult      05/07/2022    8:03 AM 04/12/2022    9:28 AM 04/06/2022   10:32 AM 03/12/2022    7:59 AM  GAD 7 : Generalized Anxiety Score  Nervous, Anxious, on Edge 3 3 3 3   Control/stop worrying 3 3 3 3   Worry too much - different things 2 3 3 3   Trouble relaxing 3 3 3 3   Restless 3 3 3 1   Easily annoyed or irritable 3 3 3 1   Afraid - awful might happen 3 0 3 0  Total GAD 7 Score 20 18 21 14   Anxiety Difficulty Very difficult Very difficult Very difficult Somewhat difficult      Past Medical History:  Diagnosis Date   Anxiety    CAD (coronary artery disease)    DES to  circumflex 02/2007, BMS to LAD and PTCA diagonal 03/2007   Carotid artery plaque    Mild   Cataract    Depression    Diverticulitis, colon    Elevated d-dimer 01/08/2014   Essential hypertension, benign    GERD (gastroesophageal reflux disease)    H/O hiatal hernia    HLD (hyperlipidemia)    IDDM (insulin dependent diabetes mellitus)    Migraine    "used to have them really bad; don't have them anymore" (01/07/2014)   MS (multiple sclerosis) ([place])    Not confirmed   PAT (paroxysmal atrial tachycardia) (HCC)    Prolapse of uterus    PVD (peripheral vascular disease) (HCC)    TIA (transient ischemic attack) 1980's      Review of Systems  Psychiatric/Behavioral:  Positive for depression.    As per HPI.    Objective:     BP 102/68   Pulse 92   Temp 98.4 F (36.9 C) (Temporal)   Ht 5' 6"  (1.676 m)   Wt  153 lb 6 oz (69.6 kg)   SpO2 97%   BMI 24.76 kg/m  BP Readings from Last 3 Encounters:  05/07/22 102/68  04/12/22 135/69  04/06/22 130/70      Physical Exam Vitals and nursing note reviewed.  Constitutional:      General: She is not in acute distress.    Appearance: She is not ill-appearing, toxic-appearing or diaphoretic.  Cardiovascular:     Rate and Rhythm: Normal rate and regular rhythm.     Heart sounds: Normal heart sounds. No murmur heard. Pulmonary:     Effort: Pulmonary effort is normal. No respiratory distress.     Breath sounds: Normal breath sounds.  Musculoskeletal:     Right lower leg: No edema.     Left lower leg: No edema.  Skin:    General: Skin is warm and dry.  Neurological:     General: No focal deficit present.     Mental Status: She is alert and oriented to person, place, and time.  Psychiatric:        Mood and Affect: Affect is tearful.        Thought Content: Thought content normal.        Judgment: Judgment normal.    No results found for any visits on 05/07/22.  Last CBC Lab Results  Component Value Date   WBC 7.7 04/06/2022    HGB 11.6 04/06/2022   HCT 34.6 04/06/2022   MCV 90 04/06/2022   MCH 30.2 04/06/2022   RDW 13.7 04/06/2022   PLT 200 24/46/2863   Last metabolic panel Lab Results  Component Value Date   GLUCOSE 136 (H) 04/19/2022   NA 143 04/19/2022   K 3.8 04/19/2022   CL 97 04/19/2022   CO2 27 04/19/2022   BUN 9 04/19/2022   CREATININE 1.08 (H) 04/19/2022   EGFR 54 (L) 04/19/2022   CALCIUM 9.3 04/19/2022   PHOS 3.7 03/26/2022   PROT 7.1 03/26/2022   ALBUMIN 3.6 03/26/2022   LABGLOB 2.4 03/12/2022   AGRATIO 1.7 03/12/2022   BILITOT 1.3 (H) 03/26/2022   ALKPHOS 52 03/26/2022   AST 19 03/26/2022   ALT 15 03/26/2022   ANIONGAP 11 03/27/2022   Last lipids Lab Results  Component Value Date   CHOL 103 03/27/2022   HDL 35 (L) 03/27/2022   LDLCALC 46 03/27/2022   LDLDIRECT 93 04/06/2015   TRIG 111 03/27/2022   CHOLHDL 2.9 03/27/2022   Last hemoglobin A1c Lab Results  Component Value Date   HGBA1C 7.7 (H) 03/26/2022      The ASCVD Risk score (Arnett DK, et al., 2019) failed to calculate for the following reasons:   The valid total cholesterol range is 130 to 320 mg/dL    Assessment & Plan:   [name] was seen today for depression.  Diagnoses and all orders for this visit:  Depression, recurrent ([place]) Generalized anxiety disorder Uncontrolled. Denies SI. She unfortunately has not been taking zoloft, so wills start back at 50 mg daily. Discussed if no improvement after a few weeks, will increase to 100 mg daily.  -     sertraline (ZOLOFT) 50 MG tablet; Take 1 tablet (50 mg total) by mouth daily.  Bad odor of urine UA pending. Discussed hydration.  -     Urinalysis, Routine w reflex microscopic  Chronic congestive heart failure, unspecified heart failure type ([place]) Type 2 diabetes mellitus with hyperglycemia, without long-term current use of insulin ([place]) Hyperlipidemia associated with type 2 diabetes mellitus ([place])  Coronary artery disease involving native coronary artery of  native heart with angina pectoris ([place]) Hypertension associated with diabetes ([place]) Stage 3a chronic kidney disease ([place]) New referral placed to Almyra Free in case her old referral was closed out.  -     AMB Referral to [facility]   Return in about 7 weeks (around 06/27/2022) for chronic follow up.   The patient indicates understanding of these issues and agrees with the plan.  Gwenlyn Perking, FNP

## 2022-05-07 NOTE — Patient Instructions (Signed)

## 2022-05-10 LAB — URINE CULTURE

## 2022-05-14 ENCOUNTER — Telehealth: Payer: Self-pay

## 2022-05-14 NOTE — Chronic Care Management (AMB) (Signed)
  Chronic Care Management   Note  05/14/2022 Name: [name] MRN: [id number] DOB: [birth year]  [name] is a 71 y.o. year old female who is a primary care patient of Gwenlyn Perking, FNP. I reached out to Janus Molder by phone today in response to a referral sent by Ms. [place] PCP.  Ms. Youngren was given information about Chronic Care Management services today including:  CCM service includes personalized support from designated clinical staff supervised by her physician, including individualized plan of care and coordination with other care providers 24/7 contact phone numbers for assistance for urgent and routine care needs. Service will only be billed when office clinical staff spend 20 minutes or more in a month to coordinate care. Only one practitioner may furnish and bill the service in a calendar month. The patient may stop CCM services at any time (effective at the end of the month) by phone call to the office staff. The patient is responsible for co-pay (up to 20% after annual deductible is met) if co-pay is required by the individual health plan.   Patient agreed to services and verbal consent obtained.   Follow up plan: Telephone appointment with care management team member scheduled for:06/15/2022  Noreene Larsson, [place], [place] [address] Direct Dial: [phone] [email or web address]

## 2022-05-24 DIAGNOSIS — Z515 Encounter for palliative care: Secondary | ICD-10-CM | POA: Diagnosis not present

## 2022-05-24 DIAGNOSIS — K5900 Constipation, unspecified: Secondary | ICD-10-CM | POA: Diagnosis not present

## 2022-05-25 ENCOUNTER — Telehealth: Payer: Self-pay | Admitting: Family Medicine

## 2022-05-25 MED ORDER — ATENOLOL 50 MG PO TABS: 50.0000 mg | ORAL_TABLET | Freq: Two times a day (BID) | ORAL | 3 refills | Status: DC

## 2022-05-25 NOTE — Telephone Encounter (Signed)
  Prescription Request  05/25/2022  Is this a "Controlled Substance" medicine? no  Have you seen your PCP in the last 2 weeks? no  If YES, route message to pool  -  If NO, patient needs to be scheduled for appointment.  What is the name of the medication or equipment? Atenolol 50 mg - Has one pill left  Have you contacted your pharmacy to request a refill? NO   Which pharmacy would you like this sent to? [facility]   Patient notified that their request is being sent to the clinical staff for review and that they should receive a response within 2 business days.

## 2022-06-14 ENCOUNTER — Other Ambulatory Visit: Payer: Self-pay | Admitting: Family Medicine

## 2022-06-14 DIAGNOSIS — E1169 Type 2 diabetes mellitus with other specified complication: Secondary | ICD-10-CM

## 2022-06-15 ENCOUNTER — Telehealth: Payer: PPO

## 2022-06-20 ENCOUNTER — Encounter: Payer: Self-pay | Admitting: Family Medicine

## 2022-06-20 ENCOUNTER — Telehealth: Payer: Self-pay | Admitting: Family Medicine

## 2022-06-20 ENCOUNTER — Ambulatory Visit (INDEPENDENT_AMBULATORY_CARE_PROVIDER_SITE_OTHER): Payer: PPO | Admitting: Family Medicine

## 2022-06-20 VITALS — BP 140/78 | HR 80 | Temp 98.5°F | Ht 66.0 in | Wt 155.5 lb

## 2022-06-20 DIAGNOSIS — R8290 Unspecified abnormal findings in urine: Secondary | ICD-10-CM

## 2022-06-20 DIAGNOSIS — F411 Generalized anxiety disorder: Secondary | ICD-10-CM

## 2022-06-20 DIAGNOSIS — I25119 Atherosclerotic heart disease of native coronary artery with unspecified angina pectoris: Secondary | ICD-10-CM | POA: Diagnosis not present

## 2022-06-20 DIAGNOSIS — E785 Hyperlipidemia, unspecified: Secondary | ICD-10-CM | POA: Diagnosis not present

## 2022-06-20 DIAGNOSIS — E1165 Type 2 diabetes mellitus with hyperglycemia: Secondary | ICD-10-CM

## 2022-06-20 DIAGNOSIS — N1831 Chronic kidney disease, stage 3a: Secondary | ICD-10-CM | POA: Diagnosis not present

## 2022-06-20 DIAGNOSIS — I152 Hypertension secondary to endocrine disorders: Secondary | ICD-10-CM

## 2022-06-20 DIAGNOSIS — F339 Major depressive disorder, recurrent, unspecified: Secondary | ICD-10-CM | POA: Diagnosis not present

## 2022-06-20 DIAGNOSIS — Z23 Encounter for immunization: Secondary | ICD-10-CM | POA: Diagnosis not present

## 2022-06-20 DIAGNOSIS — E1159 Type 2 diabetes mellitus with other circulatory complications: Secondary | ICD-10-CM

## 2022-06-20 DIAGNOSIS — E1169 Type 2 diabetes mellitus with other specified complication: Secondary | ICD-10-CM

## 2022-06-20 LAB — BAYER DCA HB A1C WAIVED: HB A1C (BAYER DCA - WAIVED): 7.4 % — ABNORMAL HIGH (ref 4.8–5.6)

## 2022-06-20 MED ORDER — SERTRALINE HCL 100 MG PO TABS: 100.0000 mg | ORAL_TABLET | Freq: Every day | ORAL | 0 refills | Status: DC

## 2022-06-20 NOTE — Progress Notes (Signed)
Established Patient Office Visit  Subjective:  Patient ID: [name], female    DOB: [birth year]  Age: 71 y.o. MRN: [id number]  CC:  Chief Complaint  Patient presents with   Medical Management of Chronic Issues   Hyperlipidemia   Diabetes   Hypertension    HPI [name] presents for chronic follow up.   1. DM Patient denies foot ulcerations, paresthesia of the feet, vomiting and weight loss.   Current diabetic medications include tresiba 60 units daily, novolog 18 units with meals TID, ozempic 1 mg Compliant with meds - Yes   Current monitoring regimen: home blood tests - CGM Home blood sugar records: in target 51% of the time, above 49% of the time Any episodes of hypoglycemia? yes- 8 times, does have symptoms with these, not in last few weeks   Eye exam current (within one year): yes Weight trend: stable   Is She on ACE inhibitor or angiotensin II receptor blocker?  No Is She on statin? Yes crestor   2. HTN Complaint with meds - Yes Current Medications - norvasc, atenolol, lasix, imdur Checking BP at home: yes, reports 130s/80s Pertinent ROS:  Headache - No Fatigue - No Visual Disturbances - No Chest pain - No Dyspnea - No Palpitations - No LE edema - No   3. Depression, anxiety She isn't sure if she is taking this. Will will look with she gets home and notify us.      06/20/2022    8:17 AM 05/07/2022    8:02 AM 04/12/2022    9:26 AM  Depression screen PHQ 2/9  Decreased Interest 1 2 3   Down, Depressed, Hopeless 2 2 3   PHQ - 2 Score 3 4 6   Altered sleeping 1 2 3   Tired, decreased energy 3 2 3   Change in appetite 3 2 3   Feeling bad or failure about yourself  0 0 0  Trouble concentrating 0 2 3  Moving slowly or fidgety/restless 0 1 0  Suicidal thoughts 0 0 0  PHQ-9 Score 10 13 18   Difficult doing work/chores Somewhat difficult Very difficult Extremely dIfficult      06/20/2022    8:17 AM 05/07/2022    8:03 AM 04/12/2022    9:28 AM 04/06/2022    10:32 AM  GAD 7 : Generalized Anxiety Score  Nervous, Anxious, on Edge 2 3 3 3   Control/stop worrying 2 3 3 3   Worry too much - different things 2 2 3 3   Trouble relaxing 2 3 3 3   Restless 2 3 3 3   Easily annoyed or irritable 2 3 3 3   Afraid - awful might happen 2 3 0 3  Total GAD 7 Score 14 20 18 21   Anxiety Difficulty Somewhat difficult Very difficult Very difficult Very difficult   4. UTI symptoms Reports urinary odor and lower abdominal pressure for the last few days. Denies dysuria, flank pain, chills, fever, nausea, vomiting. She has a history of recurrent UTIs.    Past Medical History:  Diagnosis Date   Anxiety    CAD (coronary artery disease)    DES to circumflex 02/2007, BMS to LAD and PTCA diagonal 03/2007   Carotid artery plaque    Mild   Cataract    Depression    Diverticulitis, colon    Elevated d-dimer 01/08/2014   Essential hypertension, benign    GERD (gastroesophageal reflux disease)    H/O hiatal hernia    HLD (hyperlipidemia)    IDDM (insulin  dependent diabetes mellitus)    Migraine    "used to have them really bad; don't have them anymore" (01/07/2014)   MS (multiple sclerosis) (HCC)    Not confirmed   PAT (paroxysmal atrial tachycardia) (HCC)    Prolapse of uterus    PVD (peripheral vascular disease) (HCC)    TIA (transient ischemic attack) 1980's    Past Surgical History:  Procedure Laterality Date   ABDOMINAL HYSTERECTOMY  1986   ovaries remain - prolaspe uterus    APPENDECTOMY  ~ [place] Right 1980's   BREAST LUMPECTOMY Right 1980's   Dr. Charlynne Pander    CARDIAC CATHETERIZATION  01/07/2014   CHOLECYSTECTOMY  ?1987   COLONOSCOPY  2002   Dr. Anwar--> Severe diverticular changes in the region of the sigmoid and descending colon with scattered diverticular changes throughout the rest of the colon. No polyps, ulcerations. Despite numerous manipulations, the tip of the scope could not be tipped into the cecal area.   COLONOSCOPY   01/10/2012   Procedure: COLONOSCOPY;  Surgeon: Daneil Dolin, MD;  [facility];  Service: Endoscopy;  Laterality: N/A;  1:55   CORONARY ANGIOPLASTY WITH STENT PLACEMENT  ~ 1997 X 2   "2 + 1"   CORONARY STENT INTERVENTION N/A 03/26/2022   Procedure: CORONARY STENT INTERVENTION;  Surgeon: Lorretta Harp, MD;  Location: [facility];  Service: Cardiovascular;  Laterality: N/A;   EYE SURGERY Bilateral 2014   cataract   INTRAVASCULAR PRESSURE WIRE/FFR STUDY N/A 03/08/2017   Procedure: Intravascular Pressure Wire/FFR Study;  Surgeon: Nelva Bush, MD;  Location: [facility];  Service: Cardiovascular;  Laterality: N/A;   LEFT HEART CATH AND CORONARY ANGIOGRAPHY N/A 03/08/2017   Procedure: Left Heart Cath and Coronary Angiography;  Surgeon: Nelva Bush, MD;  Location: [facility];  Service: Cardiovascular;  Laterality: N/A;   LEFT HEART CATH AND CORONARY ANGIOGRAPHY N/A 03/26/2022   Procedure: LEFT HEART CATH AND CORONARY ANGIOGRAPHY;  Surgeon: Lorretta Harp, MD;  Location: [facility];  Service: Cardiovascular;  Laterality: N/A;   LEFT HEART CATHETERIZATION WITH CORONARY ANGIOGRAM N/A 01/07/2014   Procedure: LEFT HEART CATHETERIZATION WITH CORONARY ANGIOGRAM;  Surgeon: Larey Dresser, MD;  Location: [facility] LAB;  Service: Cardiovascular;  Laterality: N/A;    Family History  Problem Relation Age of Onset   Heart attack Mother 64   Diabetes Mother    Hypertension Mother    Heart attack Father 59   Heart attack Brother 60       x 6   Heart disease Brother    Diabetes Brother    Colon cancer Paternal Aunt        84s, died with brain anuerysm   Crohn's disease Cousin        paternal   Diabetes Sister    GER disease Daughter    Cervical cancer Daughter    Diabetes Daughter     Social History   Socioeconomic History   Marital status: Widowed    Spouse name: Not on file   Number of children: 4   Years of education: 80   Highest education  level: 11th grade  Occupational History   Occupation: Disability    Employer: DISABLED  Tobacco Use   Smoking status: Never   Smokeless tobacco: Never   Tobacco comments:    spouse, 29 years - husband has quit 01/2011  Vaping Use   Vaping Use: Never used  Substance and Sexual Activity   Alcohol use: No   Drug use: No   Sexual activity: Not Currently  Other Topics Concern   Not on file  Social History Narrative   Lives alone, one level, handicap accessible bathroom, her children all live nearby   Social Determinants of Health   Financial Resource Strain: Low Risk  (01/22/2022)   Overall Financial Resource Strain (CARDIA)    Difficulty of Paying Living Expenses: Not very hard  Food Insecurity: No Food Insecurity (01/22/2022)   Hunger Vital Sign    Worried About Running Out of Food in the Last Year: Never true    Ran Out of Food in the Last Year: Never true  Transportation Needs: No Transportation Needs (01/22/2022)   PRAPARE - Hydrologist (Medical): No    Lack of Transportation (Non-Medical): No  Physical Activity: Insufficiently Active (01/22/2022)   Exercise Vital Sign    Days of Exercise per Week: 7 days    Minutes of Exercise per Session: 20 min  Stress: No Stress Concern Present (01/22/2022)   [place]    Feeling of Stress : Only a little  Social Connections: Moderately Integrated (01/22/2022)   Social Connection and Isolation Panel [NHANES]    Frequency of Communication with Friends and Family: More than three times a week    Frequency of Social Gatherings with Friends and Family: More than three times a week    Attends Religious Services: More than 4 times per year    Active Member of [employer] or Organizations: Yes    Attends Archivist Meetings: More than 4 times per year    Marital Status: Widowed  Intimate Partner Violence: Not At Risk (01/22/2022)   Humiliation, Afraid,  Rape, and Kick questionnaire    Fear of Current or Ex-Partner: No    Emotionally Abused: No    Physically Abused: No    Sexually Abused: No    Outpatient Medications Prior to Visit  Medication Sig Dispense Refill   amLODipine (NORVASC) 10 MG tablet Take 1 tablet (10 mg total) by mouth daily. 90 tablet 3   aspirin 81 MG chewable tablet Chew 1 tablet (81 mg total) by mouth daily. 90 tablet 3   atenolol (TENORMIN) 50 MG tablet Take 1 tablet (50 mg total) by mouth 2 (two) times daily. 180 tablet 3   clopidogrel (PLAVIX) 75 MG tablet Take 1 tablet (75 mg total) by mouth daily with breakfast. 90 tablet 3   Continuous Blood Gluc Receiver (FREESTYLE LIBRE 2 READER) DEVI USE TO TEST BLOOD SUGAR 6 TIMES DAILY. Dx:E11.65 1 each 0   Continuous Blood Gluc Sensor (FREESTYLE LIBRE 2 SENSOR) MISC USE TO TEST BLOOD SUGAR 6 TIMES DAILY 2 each 11   furosemide (LASIX) 40 MG tablet Take 1 tablet (40 mg total) by mouth daily. 90 tablet 3   insulin degludec (TRESIBA FLEXTOUCH) 100 UNIT/ML FlexTouch Pen Inject 60 Units into the skin daily.     isosorbide mononitrate (IMDUR) 60 MG 24 hr tablet Take 1 tablet (60 mg total) by mouth daily. 90 tablet 3   nitroGLYCERIN (NITROSTAT) 0.4 MG SL tablet DISSOLVE 1 TABLET UNDER TONGUE FOR CHESTPAIN.MAY REPEAT EVERY 5 MINUTES FOR 3 DOSES.IF NO RELIEF CALL 911 OR GO TO ER 25 tablet 3   NOVOLOG FLEXPEN 100 UNIT/ML FlexPen INJECT 35-40 UNITS SQ 3 TIMES DAILY WITH MEALS (Patient taking differently: Inject 20-30 Units into the skin 3 (three) times  daily with meals.) 30 mL 3   rOPINIRole (REQUIP) 0.5 MG tablet Take 2 tablets (1 mg total) by mouth at bedtime. (Patient taking differently: Take 1 mg by mouth daily as needed (restless leg).) 180 tablet 2   rosuvastatin (CRESTOR) 20 MG tablet TAKE ONE (1) TABLET BY MOUTH EVERY DAY 90 tablet 0   Semaglutide, 1 MG/DOSE, (OZEMPIC, 1 MG/DOSE,) 2 MG/1.5ML SOPN Inject 1 mg into the skin once a week.     sertraline (ZOLOFT) 50 MG tablet Take 1  tablet (50 mg total) by mouth daily. 90 tablet 1   traZODone (DESYREL) 100 MG tablet Take 1 tablet (100 mg total) by mouth at bedtime as needed for sleep. 90 tablet 1   cephALEXin (KEFLEX) 500 MG capsule Take 1 capsule (500 mg total) by mouth 2 (two) times daily. 14 capsule 0   No facility-administered medications prior to visit.    Allergies  Allergen Reactions   Iohexol      Desc: pt had syncopal episode with nausea post IV CM late 1990's,  pt has had prednisone prep with heart caths x 2 without problem  kdean 04/16/07, Onset Date: [id number]    Ticlid [Ticlopidine Hcl] Nausea And Vomiting   Jardiance [Empagliflozin] Other (See Comments)    Recurrent UTIs   Metformin And Related Diarrhea   Codeine Nausea And Vomiting and Palpitations    ROS Review of Systems As per HPI.   Objective:    Physical Exam Vitals and nursing note reviewed.  Constitutional:      General: She is not in acute distress.    Appearance: She is not ill-appearing, toxic-appearing or diaphoretic.  HENT:     Head: Normocephalic and atraumatic.  Eyes:     Extraocular Movements: Extraocular movements intact.     Pupils: Pupils are equal, round, and reactive to light.  Neck:     Thyroid: No thyroid mass, thyromegaly or thyroid tenderness.     Vascular: No carotid bruit.  Cardiovascular:     Rate and Rhythm: Normal rate and regular rhythm.     Heart sounds: Normal heart sounds. No murmur heard. Pulmonary:     Effort: Pulmonary effort is normal. No respiratory distress.     Breath sounds: Normal breath sounds.  Abdominal:     General: Bowel sounds are normal. There is no distension.     Palpations: Abdomen is soft.     Tenderness: There is no abdominal tenderness. There is no right CVA tenderness, left CVA tenderness, guarding or rebound.  Musculoskeletal:     Cervical back: Neck supple. No tenderness.     Right lower leg: No edema.     Left lower leg: No edema.  Skin:    General: Skin is warm.   Neurological:     General: No focal deficit present.     Mental Status: She is alert and oriented to person, place, and time.     Gait: Gait normal.  Psychiatric:        Mood and Affect: Mood normal.        Behavior: Behavior normal.        Thought Content: Thought content normal.        Judgment: Judgment normal.    Diabetic Foot Exam - Simple   No data filed     BP (!) 140/78   Pulse 80   Temp 98.5 F (36.9 C) (Temporal)   Ht 5' 6"  (1.676 m)   Wt 155 lb 8 oz (70.5 kg)  SpO2 97%   BMI 25.10 kg/m  Wt Readings from Last 3 Encounters:  06/20/22 155 lb 8 oz (70.5 kg)  05/07/22 153 lb 6 oz (69.6 kg)  04/12/22 158 lb 8 oz (71.9 kg)    Health Maintenance Due  Topic Date Due   OPHTHALMOLOGY EXAM  09/27/2020   MAMMOGRAM  06/16/2022    There are no preventive care reminders to display for this patient.  Lab Results  Component Value Date   TSH 2.110 12/11/2021   Lab Results  Component Value Date   WBC 7.7 04/06/2022   HGB 11.6 04/06/2022   HCT 34.6 04/06/2022   MCV 90 04/06/2022   PLT 200 04/06/2022   Lab Results  Component Value Date   NA 143 04/19/2022   K 3.8 04/19/2022   CO2 27 04/19/2022   GLUCOSE 136 (H) 04/19/2022   BUN 9 04/19/2022   CREATININE 1.08 (H) 04/19/2022   BILITOT 1.3 (H) 03/26/2022   ALKPHOS 52 03/26/2022   AST 19 03/26/2022   ALT 15 03/26/2022   PROT 7.1 03/26/2022   ALBUMIN 3.6 03/26/2022   CALCIUM 9.3 04/19/2022   ANIONGAP 11 03/27/2022   EGFR 54 (L) 04/19/2022   Lab Results  Component Value Date   CHOL 103 03/27/2022   Lab Results  Component Value Date   HDL 35 (L) 03/27/2022   Lab Results  Component Value Date   LDLCALC 46 03/27/2022   Lab Results  Component Value Date   TRIG 111 03/27/2022   Lab Results  Component Value Date   CHOLHDL 2.9 03/27/2022   Lab Results  Component Value Date   HGBA1C 7.7 (H) 03/26/2022      Assessment & Plan:   Annalisa was seen today for medical management of chronic issues,  hyperlipidemia, diabetes and hypertension.  Diagnoses and all orders for this visit:  Type 2 diabetes mellitus with hyperglycemia, without long-term current use of insulin (HCC) A1c is 7.4 today. Discussed tighter control with meal time insulin and diet. Continue tresiba, novolog, and ozempic. Labs pending as below. Foot exam today. Micro albumin is UTD. On statin. Not on ACE or ARB. Eye exam is UTD. Labs pending.  -     CBC with Differential/Platelet -     CMP14+EGFR -     Lipid panel -     Bayer DCA Hb A1c Waived  Hyperlipidemia associated with type 2 diabetes mellitus ([place]) Labs pending. On statin.  -     CBC with Differential/Platelet -     CMP14+EGFR -     Lipid panel  Hypertension associated with diabetes ([place]) Well controlled on current regimen. On norvasc, atenolol, imdur, lasix. -     CBC with Differential/Platelet -     CMP14+EGFR -     Lipid panel  Stage 3a chronic kidney disease (HCC) Labs pneding.  -     CBC with Differential/Platelet -     CMP14+EGFR  Coronary artery disease involving native coronary artery of native heart with angina pectoris [facility]) Managed by cardiology. On plavix, aspirin, norvasc, atenolol, lasix, imdur, and statin -     CBC with Differential/Platelet -     CMP14+EGFR -     Lipid panel  Depression, recurrent (HCC) Generalized anxiety disorder Not well controlled. She is unsure if she is taking zoloft. She will check when she gets home and notify office. Denies SI.   Bad odor of urine Will return with urine specimen for testing. Hx of recurrent UTIs. Last was 1  month ago and treated with keflex.  -     Urinalysis, Routine w reflex microscopic -     Urine Culture  Need for immunization against influenza Flu vaccine today.     Return in about 3 months (around 09/19/2022) for chronic follow up.   The patient indicates understanding of these issues and agrees with the plan.   Gwenlyn Perking, FNP

## 2022-06-20 NOTE — Addendum Note (Signed)
Addended by: Milas Hock on: 06/20/2022 04:21 PM   Modules accepted: Orders

## 2022-06-20 NOTE — Telephone Encounter (Signed)
Would she like to increase this since she has been taking it? Did she drop off urine for testing?

## 2022-06-20 NOTE — Telephone Encounter (Signed)
Pt would like to increase her zoloft. Per Tiffany she should increase to '100mg'$  so rx for '100mg'$  sent into pharmacy and pt is aware. Pt was not able to leave urine sample but will try to bring one by tomorrow.

## 2022-06-21 ENCOUNTER — Other Ambulatory Visit: Payer: Self-pay | Admitting: Family Medicine

## 2022-06-21 DIAGNOSIS — E876 Hypokalemia: Secondary | ICD-10-CM

## 2022-06-21 LAB — CBC WITH DIFFERENTIAL/PLATELET
Basophils Absolute: 0 10*3/uL (ref 0.0–0.2)
Basos: 0 %
EOS (ABSOLUTE): 0.2 10*3/uL (ref 0.0–0.4)
Eos: 3 %
Hematocrit: 33.5 % — ABNORMAL LOW (ref 34.0–46.6)
Hemoglobin: 11 g/dL — ABNORMAL LOW (ref 11.1–15.9)
Immature Grans (Abs): 0 10*3/uL (ref 0.0–0.1)
Immature Granulocytes: 0 %
Lymphocytes Absolute: 1.8 10*3/uL (ref 0.7–3.1)
Lymphs: 34 %
MCH: 29.2 pg (ref 26.6–33.0)
MCHC: 32.8 g/dL (ref 31.5–35.7)
MCV: 89 fL (ref 79–97)
Monocytes Absolute: 0.4 10*3/uL (ref 0.1–0.9)
Monocytes: 7 %
Neutrophils Absolute: 3 10*3/uL (ref 1.4–7.0)
Neutrophils: 56 %
Platelets: 190 10*3/uL (ref 150–450)
RBC: 3.77 x10E6/uL (ref 3.77–5.28)
RDW: 13.2 % (ref 11.7–15.4)
WBC: 5.4 10*3/uL (ref 3.4–10.8)

## 2022-06-21 LAB — LIPID PANEL
Chol/HDL Ratio: 3.5 ratio (ref 0.0–4.4)
Cholesterol, Total: 128 mg/dL (ref 100–199)
HDL: 37 mg/dL — ABNORMAL LOW (ref >39)
LDL Chol Calc (NIH): 71 mg/dL (ref 0–99)
Triglycerides: 105 mg/dL (ref 0–149)
VLDL Cholesterol Cal: 20 mg/dL (ref 5–40)

## 2022-06-21 LAB — CMP14+EGFR
ALT: 9 IU/L (ref 0–32)
AST: 14 IU/L (ref 0–40)
Albumin/Globulin Ratio: 1.2 (ref 1.2–2.2)
Albumin: 3.4 g/dL — ABNORMAL LOW (ref 3.8–4.8)
Alkaline Phosphatase: 61 IU/L (ref 44–121)
BUN/Creatinine Ratio: 10 — ABNORMAL LOW (ref 12–28)
BUN: 8 mg/dL (ref 8–27)
Bilirubin Total: 0.3 mg/dL (ref 0.0–1.2)
CO2: 32 mmol/L — ABNORMAL HIGH (ref 20–29)
Calcium: 8.6 mg/dL — ABNORMAL LOW (ref 8.7–10.3)
Chloride: 96 mmol/L (ref 96–106)
Creatinine, Ser: 0.78 mg/dL (ref 0.57–1.00)
Globulin, Total: 2.9 g/dL (ref 1.5–4.5)
Glucose: 154 mg/dL — ABNORMAL HIGH (ref 70–99)
Potassium: 3.1 mmol/L — ABNORMAL LOW (ref 3.5–5.2)
Sodium: 141 mmol/L (ref 134–144)
Total Protein: 6.3 g/dL (ref 6.0–8.5)
eGFR: 80 mL/min/{1.73_m2} (ref >59)

## 2022-06-21 MED ORDER — POTASSIUM CHLORIDE CRYS ER 10 MEQ PO TBCR: 10.0000 meq | EXTENDED_RELEASE_TABLET | Freq: Two times a day (BID) | ORAL | 1 refills | Status: DC

## 2022-06-22 ENCOUNTER — Other Ambulatory Visit: Payer: PPO

## 2022-06-22 ENCOUNTER — Other Ambulatory Visit: Payer: Self-pay | Admitting: Family Medicine

## 2022-06-22 DIAGNOSIS — E876 Hypokalemia: Secondary | ICD-10-CM

## 2022-06-22 DIAGNOSIS — R8290 Unspecified abnormal findings in urine: Secondary | ICD-10-CM | POA: Diagnosis not present

## 2022-06-22 DIAGNOSIS — N3000 Acute cystitis without hematuria: Secondary | ICD-10-CM

## 2022-06-22 LAB — URINALYSIS, ROUTINE W REFLEX MICROSCOPIC
Bilirubin, UA: NEGATIVE
Glucose, UA: NEGATIVE
Ketones, UA: NEGATIVE
Nitrite, UA: POSITIVE — AB
Specific Gravity, UA: 1.01 (ref 1.005–1.030)
Urobilinogen, Ur: 4 mg/dL — ABNORMAL HIGH (ref 0.2–1.0)
pH, UA: 6.5 (ref 5.0–7.5)

## 2022-06-22 LAB — MICROSCOPIC EXAMINATION
Epithelial Cells (non renal): NONE SEEN /hpf (ref 0–10)
Renal Epithel, UA: NONE SEEN /hpf
WBC, UA: >30 /hpf — AB (ref 0–5)

## 2022-06-22 MED ORDER — CEFDINIR 300 MG PO CAPS: 300.0000 mg | ORAL_CAPSULE | Freq: Two times a day (BID) | ORAL | 0 refills | Status: AC

## 2022-06-22 NOTE — Addendum Note (Signed)
Addended by: Milas Hock on: 06/22/2022 11:35 AM   Modules accepted: Orders

## 2022-06-22 NOTE — Progress Notes (Signed)
UA indicated UTI. Culture pending. I have sent in omnicef for her to start.

## 2022-06-22 NOTE — Progress Notes (Signed)
Called pt and advised of provider feedback and pt also needs repeat potassium in 1 week per result notes so future order placed and pt aware.

## 2022-06-22 NOTE — Progress Notes (Signed)
TTC pt but no answer and VM not set up.

## 2022-06-25 LAB — URINE CULTURE

## 2022-06-29 ENCOUNTER — Other Ambulatory Visit: Payer: PPO

## 2022-06-29 DIAGNOSIS — E876 Hypokalemia: Secondary | ICD-10-CM | POA: Diagnosis not present

## 2022-06-30 LAB — POTASSIUM: Potassium: 4.1 mmol/L (ref 3.5–5.2)

## 2022-07-03 ENCOUNTER — Telehealth: Payer: Self-pay | Admitting: Family Medicine

## 2022-07-03 NOTE — Telephone Encounter (Signed)
Patient aware and verbalizes understanding. 

## 2022-07-03 NOTE — Telephone Encounter (Signed)
Yes, continue taking

## 2022-07-03 NOTE — Telephone Encounter (Signed)
Patient is aware of normal potassium. Wants to know if she needs to continue taking potassium chloride (KLOR-CON M) 10 MEQ tablet. Please call back and advise.

## 2022-07-05 ENCOUNTER — Telehealth: Payer: Self-pay | Admitting: Family Medicine

## 2022-07-05 NOTE — Telephone Encounter (Signed)
Patient lost her scanner for her [name]. Needs another one called in. Send to the drug store. Please call back to let her know if another one can be called in.

## 2022-07-05 NOTE — Telephone Encounter (Signed)
Please disregard previous message. Pt found it.

## 2022-07-12 ENCOUNTER — Ambulatory Visit (INDEPENDENT_AMBULATORY_CARE_PROVIDER_SITE_OTHER): Payer: PPO | Admitting: Pharmacist

## 2022-07-12 DIAGNOSIS — Z794 Long term (current) use of insulin: Secondary | ICD-10-CM

## 2022-07-12 DIAGNOSIS — N1831 Chronic kidney disease, stage 3a: Secondary | ICD-10-CM

## 2022-07-12 NOTE — Patient Instructions (Signed)
Visit Information  Following are the goals we discussed today:  Current Barriers:  Unable to independently afford treatment regimen Unable to achieve control of T2DM  Unable to maintain control of T2DM  Pharmacist Clinical Goal(s):  patient will verbalize ability to afford treatment regimen achieve control of t2dm as evidenced by goal a1c<7% through collaboration with PharmD and provider.   Interventions: 1:1 collaboration with Gwenlyn Perking, FNP regarding development and update of comprehensive plan of care as evidenced by provider attestation and co-signature Inter-disciplinary care team collaboration (see longitudinal plan of care) Comprehensive medication review performed; medication list updated in electronic medical record  Diabetes: Goal on Track (progressing): YES. Uncontrolled-A1c 7.4% but variable BG readings Patient to bring in [place] 2 CGM reader next week for review Reports variable BG (hypoglycemia around 12am, post prandial hyperglycemia in the 200s) GFR 45-->80 Reports FBG 120-200 Past intolerances: metformin, farxiga (SGLT2s) Current treatment: Tresiba 60 units, Novolog 20 units 2x w/ meals (only eats 2 meals daily), Ozempic '1mg'$  weekly;  PLAN: Continue Tresiba 50-60 units every morning, Novolog 20-30 units with meals (2x daily, only eats 2 meals), Continue ozempic '1mg'$  weekly Post prandials higher--instructed patient to take insulin 28mn before eating-->must cover meals She is still taking after her meals Avg 2 meals/day--must be consistent  Denies personal and family history of Medullary thyroid cancer (MTC) Refills sent for patient assistance with novo nordisk 2023 (tresiba, novolog, ozempic)--will re enroll patient in November Reports hyper/hypoglycemia (previously had) Discussed meal planning options and Plate method for healthy eating Avoid sugary drinks and desserts Incorporate balanced protein, non starchy veggies, 1 serving of carbohydrate with each  meal Increase water intake Increase physical activity as able Discussed FOLLOWING A HEART HEALTHY DIET/HEALTHY PLATE METHOD Lipids currently controlled on rosuvastatin '40mg'$  Current exercise: n/a  Patient Goals/Self-Care Activities patient will:  - take medications as prescribed as evidenced by patient report and record review check glucose using libre 2 CGM, document, and provide at future appointments collaborate with provider on medication access solutions   Plan: Telephone follow up appointment with care management team member scheduled for:  07/2022  Signature JRegina Eck PharmD, BCPS Clinical Pharmacist, [facility] [facility] Phone [phone]  Please call the care guide team at [phone] you need to cancel or reschedule your appointment.   The patient verbalized understanding of instructions, educational materials, and care plan provided today and DECLINED offer to receive copy of patient instructions, educational materials, and care plan.

## 2022-07-12 NOTE — Progress Notes (Signed)
Chronic Care Management Pharmacy Note  07/12/2022 Name:  [name] MRN:  [id number] DOB:  03-20-1951  Summary: Diabetes: Goal on Track (progressing): YES. Uncontrolled-A1c 7.4% but variable BG readings Patient to bring in [place] 2 CGM reader next week for review Reports variable BG (hypoglycemia around 12am, post prandial hyperglycemia in the 200s) GFR 45-->80 Reports FBG 120-200 Past intolerances: metformin, farxiga (SGLT2s) Current treatment: Tresiba 60 units, Novolog 20 units 2x w/ meals (only eats 2 meals daily), Ozempic 66m weekly;  PLAN: Continue Tresiba 50-60 units every morning, Novolog 20-30 units with meals (2x daily, only eats 2 meals), Continue ozempic 177mweekly Post prandials higher--instructed patient to take insulin 1562mbefore eating-->must cover meals She is still taking after her meals Avg 2 meals/day--must be consistent  Denies personal and family history of Medullary thyroid cancer (MTC) Refills sent for patient assistance with novo nordisk 2023 (tresiba, novolog, ozempic)--will re enroll patient in November Reports hyper/hypoglycemia (previously had) Discussed meal planning options and Plate method for healthy eating Avoid sugary drinks and desserts Incorporate balanced protein, non starchy veggies, 1 serving of carbohydrate with each meal Increase water intake Increase physical activity as able Discussed FOLLOWING A HEART HEALTHY DIET/HEALTHY PLATE METHOD Lipids currently controlled on rosuvastatin 46m57mrrent exercise: n/a  Patient Goals/Self-Care Activities patient will:  - take medications as prescribed as evidenced by patient report and record review check glucose using libre 2 CGM, document, and provide at future appointments collaborate with provider on medication access solutions   Subjective: [name] 71 y66. year old female who is a primary patient of MorgGwenlyn PerkingP.  The CCM team was consulted for assistance with  disease management and care coordination needs.    Engaged with patient by telephone for follow up visit in response to provider referral for pharmacy case management and/or care coordination services.   Consent to Services:  The patient was given information about Chronic Care Management services, agreed to services, and gave verbal consent prior to initiation of services.  Please see initial visit note for detailed documentation.   Patient Care Team: MorgGwenlyn PerkingP as PCP - General (Family Medicine) HochMinus Breeding as PCP - Cardiology (Cardiology) RourDaneil Dolin (Gastroenterology) HochMinus Breeding (Cardiology) Le, Harlen Labs as Referring Physician (Optometry) PruiLavera GuiseH [facility] (Family Medicine) BeavSandford Craze as Referring Physician (Dermatology)   Objective:  Lab Results  Component Value Date   CREATININE 0.78 06/20/2022   CREATININE 1.08 (H) 04/19/2022   CREATININE 1.01 (H) 04/06/2022    Lab Results  Component Value Date   HGBA1C 7.4 (H) 06/20/2022   Last diabetic Eye exam:  Lab Results  Component Value Date/Time   HMDIABEYEEXA No Retinopathy 11/29/2015 12:00 AM    Last diabetic Foot exam: No results found for: "HMDIABFOOTEX"      Component Value Date/Time   CHOL 128 06/20/2022 0813   CHOL 107 02/05/2013 1002   TRIG 105 06/20/2022 0813   TRIG 149 11/30/2015 0947   TRIG 143 02/05/2013 1002   HDL 37 (L) 06/20/2022 0813   HDL 35 (L) 11/30/2015 0947   HDL 35 (L) 02/05/2013 1002   CHOLHDL 3.5 06/20/2022 0813   CHOLHDL 2.9 03/27/2022 0537   VLDL 22 03/27/2022 0537   LDLCALC 71 06/20/2022 0813   LDLCALC 195 (H) 04/26/2014 1029   LDLCDyess05/15/2014 1002   LDLDIRECT 93 04/06/2015 1038       Latest Ref  Rng & Units 06/20/2022    8:13 AM 03/26/2022    6:25 AM 03/12/2022    8:11 AM  Hepatic Function  Total Protein 6.0 - 8.5 g/dL 6.3  7.1  6.4   Albumin 3.8 - 4.8 g/dL 3.4  3.6  4.0   AST 0 - 40 IU/L 14  19  13     ALT 0 - 32 IU/L 9  15  13    Alk Phosphatase 44 - 121 IU/L 61  52  69   Total Bilirubin 0.0 - 1.2 mg/dL 0.3  1.3  0.4     Lab Results  Component Value Date/Time   TSH 2.110 12/11/2021 08:54 AM   TSH 3.372 11/15/2020 08:14 PM   TSH 1.950 04/23/2018 09:43 AM   FREET4 1.16 04/17/2007 06:30 AM       Latest Ref Rng & Units 06/20/2022    8:13 AM 04/06/2022   11:12 AM 03/27/2022    1:30 AM  CBC  WBC 3.4 - 10.8 x10E3/uL 5.4  7.7  20.3   Hemoglobin 11.1 - 15.9 g/dL 11.0  11.6  10.5   Hematocrit 34.0 - 46.6 % 33.5  34.6  30.2   Platelets 150 - 450 x10E3/uL 190  200  162     Lab Results  Component Value Date/Time   VD25OH 90.28 11/15/2020 08:14 PM   VD25OH 45.4 02/20/2019 11:52 AM    Clinical ASCVD: No  The ASCVD Risk score (Arnett DK, et al., 2019) failed to calculate for the following reasons:   The valid total cholesterol range is 130 to 320 mg/dL    Other: (CHADS2VASc if Afib, PHQ9 if depression, MMRC or CAT for COPD, ACT, DEXA)  Social History   Tobacco Use  Smoking Status Never  Smokeless Tobacco Never  Tobacco Comments   spouse, 55 years - husband has quit 01/2011   BP Readings from Last 3 Encounters:  06/20/22 (!) 140/78  05/07/22 102/68  04/12/22 135/69   Pulse Readings from Last 3 Encounters:  06/20/22 80  05/07/22 92  04/12/22 82   Wt Readings from Last 3 Encounters:  06/20/22 155 lb 8 oz (70.5 kg)  05/07/22 153 lb 6 oz (69.6 kg)  04/12/22 158 lb 8 oz (71.9 kg)    Assessment: Review of patient past medical history, allergies, medications, health status, including review of consultants reports, laboratory and other test data, was performed as part of comprehensive evaluation and provision of chronic care management services.   SDOH:  (Social Determinants of Health) assessments and interventions performed:  SDOH Interventions    Flowsheet Row Office Visit from 06/20/2022 in [place] Visit from 05/07/2022 in [place] Visit from 04/12/2022 in [place] Visit from 04/06/2022 in [place] Visit from 03/12/2022 in [place] Visit from 01/24/2022 in [place]  SDOH Interventions        Depression Interventions/Treatment  Currently on Treatment Currently on Treatment Currently on Treatment Currently on Treatment Currently on Treatment Currently on Treatment       CCM Care Plan  Allergies  Allergen Reactions   Iohexol      Desc: pt had syncopal episode with nausea post IV CM late 1990's,  pt has had prednisone prep with heart caths x 2 without problem  kdean 04/16/07, Onset Date: 49702637    Ticlid [Ticlopidine Hcl] Nausea And Vomiting   Jardiance [Empagliflozin] Other (See Comments)  Recurrent UTIs   Metformin And Related Diarrhea   Codeine Nausea And Vomiting and Palpitations    Medications Reviewed Today     Reviewed by Gwenlyn Perking, FNP (Family Nurse Practitioner) on 06/20/22 at [phone]  Med List Status: <None>   Medication Order Taking? Sig Documenting Provider Last Dose Status Informant  amLODipine (NORVASC) 10 MG tablet [id number] Yes Take 1 tablet (10 mg total) by mouth daily. Rai, Vernelle Emerald, MD Taking Active   aspirin 81 MG chewable tablet [id number] Yes Chew 1 tablet (81 mg total) by mouth daily. Rai, Vernelle Emerald, MD Taking Active   atenolol (TENORMIN) 50 MG tablet [id number] Yes Take 1 tablet (50 mg total) by mouth 2 (two) times daily. Gwenlyn Perking, FNP Taking Active   clopidogrel (PLAVIX) 75 MG tablet [id number] Yes Take 1 tablet (75 mg total) by mouth daily with breakfast. Rai, Vernelle Emerald, MD Taking Active   Continuous Blood Gluc Receiver (FREESTYLE LIBRE 2 READER) [place] [id number] Yes USE TO TEST BLOOD SUGAR 6 TIMES DAILY. Dx:E11.65 Gwenlyn Perking, FNP Taking Active Self  Continuous Blood Gluc Sensor (FREESTYLE LIBRE 2 SENSOR) [place] [id number] Yes USE  TO TEST BLOOD SUGAR 6 TIMES DAILY Gwenlyn Perking, FNP Taking Active Self  furosemide (LASIX) 40 MG tablet [id number] Yes Take 1 tablet (40 mg total) by mouth daily. Gwenlyn Perking, FNP Taking Active   insulin degludec (TRESIBA FLEXTOUCH) 100 UNIT/ML FlexTouch Pen [id number] Yes Inject 60 Units into the skin daily. [provider] Taking Active Self           Med Note [address]   Mon Mar 26, 2022  8:28 AM)    isosorbide mononitrate (IMDUR) 60 MG 24 hr tablet [id number] Yes Take 1 tablet (60 mg total) by mouth daily. Rai, Vernelle Emerald, MD Taking Active   nitroGLYCERIN (NITROSTAT) 0.4 MG SL tablet [id number] Yes DISSOLVE 1 TABLET UNDER TONGUE FOR CHESTPAIN.MAY REPEAT EVERY 5 MINUTES FOR 3 DOSES.IF NO RELIEF CALL 911 OR GO TO ER Rai, Ripudeep K, MD Taking Active   NOVOLOG FLEXPEN 100 UNIT/ML FlexPen [id number] Yes INJECT 35-40 UNITS SQ 3 TIMES DAILY WITH MEALS  Patient taking differently: Inject 20-30 Units into the skin 3 (three) times daily with meals.   Dettinger, Fransisca Kaufmann, MD Taking Active Self  rOPINIRole (REQUIP) 0.5 MG tablet [id number] Yes Take 2 tablets (1 mg total) by mouth at bedtime.  Patient taking differently: Take 1 mg by mouth daily as needed (restless leg).   Gwenlyn Perking, FNP Taking Active Self  rosuvastatin (CRESTOR) 20 MG tablet [id number] Yes TAKE ONE (1) TABLET BY MOUTH EVERY DAY Gwenlyn Perking, FNP Taking Active   Semaglutide, 1 MG/DOSE, (OZEMPIC, 1 MG/DOSE,) 2 MG/1.5ML SOPN [id number] Yes Inject 1 mg into the skin once a week. [provider] Taking Active Self           Med Note [address]   Mon Mar 26, 2022  8:37 AM) On thursdays  sertraline (ZOLOFT) 50 MG tablet [id number] Yes Take 1 tablet (50 mg total) by mouth daily. Gwenlyn Perking, FNP Taking Active   traZODone (DESYREL) 100 MG tablet [id number] Yes Take 1 tablet (100 mg total) by mouth at bedtime as needed for sleep. Gwenlyn Perking, FNP Taking Active Self  Med List Note Gwenlyn Perking, [facility] 08/09/21 1015): nov            Patient Active Problem List   Diagnosis Date Noted   Stage 3a chronic kidney  disease ([place]) 06/20/2022   Dyslipidemia 04/02/2022   Chest pain 03/26/2022   Nausea and vomiting 03/26/2022   Incomplete emptying of bladder 02/20/2022   Restless leg syndrome 01/09/2021   Insomnia due to medical condition 01/09/2021   Congestive heart failure ([place]) 11/30/2020   Depression, recurrent ([place]) 11/02/2020   Recurrent UTI 10/11/2020   Generalized weakness 10/11/2020   Recurrent falls 10/11/2020   QT prolongation 10/11/2020   Osteopenia after menopause 12/13/2017   H/O prolonged Q-T interval on ECG 03/06/2017   Vitamin B12 deficiency 01/13/2016   History of stroke 09/01/2015   History of TIA (transient ischemic attack) 08/14/2014   Uncontrolled type 2 diabetes mellitus with hyperglycemia, with long-term current use of insulin ([place]) 08/14/2014   Cataract 02/08/2014   Macular degeneration 02/08/2014   Accelerating angina ([facility]) 01/07/2014   Generalized anxiety disorder 09/14/2013   Claudication ([place]) 06/18/2013   Gastroesophageal reflux disease without esophagitis 01/01/2012   Hypokalemia 08/24/2011   Type 2 diabetes mellitus with hyperglycemia ([place]) 08/23/2011   Hypertension associated with diabetes ([place]) 08/23/2011   PALPITATIONS 02/10/2010   Hyperlipidemia associated with type 2 diabetes mellitus ([place]) 05/10/2009   Coronary artery disease involving native coronary artery of native heart with angina pectoris ([place]) 08/26/2008    Immunization History  Administered Date(s) Administered   Fluad Quad(high Dose 65+) 06/17/2019, 07/09/2020, 06/09/2021, 06/20/2022   Influenza, High Dose Seasonal PF 06/19/2017, 06/18/2018   Influenza,inj,Quad PF,6+ Mos 06/28/2015   Influenza-Unspecified 05/25/2014, 06/15/2016   Moderna Sars-Covid-2 Vaccination 11/19/2019, 12/18/2019   Pneumococcal Conjugate-13 02/08/2014   Pneumococcal Polysaccharide-23  06/28/2015   Tdap 11/22/2010, 12/11/2021   Zoster Recombinat (Shingrix) 12/11/2021, 03/12/2022    Conditions to be addressed/monitored: DMII and CKD Stage 2  Care Plan : PHARMD MEDICATION MANAGEMENT  Updates made by Lavera Guise, [place] since 07/12/2022 12:00 AM     Problem: DISEASE PROGRESSION PREVENTION      Long-Range Goal: T2DM   Recent Progress: Not on track  Note:   Current Barriers:  Unable to independently afford treatment regimen Unable to achieve control of T2DM  Unable to maintain control of T2DM  Pharmacist Clinical Goal(s):  patient will verbalize ability to afford treatment regimen achieve control of t2dm as evidenced by goal a1c<7%  through collaboration with PharmD and provider.   Interventions: 1:1 collaboration with Gwenlyn Perking, FNP regarding development and update of comprehensive plan of care as evidenced by provider attestation and co-signature Inter-disciplinary care team collaboration (see longitudinal plan of care) Comprehensive medication review performed; medication list updated in electronic medical record  Diabetes: Goal on Track (progressing): YES. Uncontrolled-A1c 7.4% but variable BG readings Patient to bring in [place] 2 CGM reader next week for review Reports variable BG (hypoglycemia around 12am, post prandial hyperglycemia in the 200s) GFR 45-->80 Reports FBG 120-200 Past intolerances: metformin, farxiga (SGLT2s) Current treatment: Tresiba 60 units, Novolog 20 units 2x w/ meals (only eats 2 meals daily), Ozempic 69m weekly;  PLAN: Continue Tresiba 50-60 units every morning, Novolog 20-30 units with meals (2x daily, only eats 2 meals), Continue ozempic 168mweekly Post prandials higher--instructed patient to take insulin 1568mbefore eating-->must cover meals She is still taking after her meals Avg 2 meals/day--must be consistent  Denies personal and family history of Medullary thyroid cancer (MTC) Refills sent for patient assistance  with novo nordisk 2023 (tresiba, novolog, ozempic)--will re enroll patient in November Reports hyper/hypoglycemia (previously had) Discussed meal planning options and Plate method for healthy eating Avoid sugary drinks and desserts Incorporate balanced  protein, non starchy veggies, 1 serving of carbohydrate with each meal Increase water intake Increase physical activity as able Discussed FOLLOWING A HEART HEALTHY DIET/HEALTHY PLATE METHOD Lipids currently controlled on rosuvastatin 50m Current exercise: n/a  Patient Goals/Self-Care Activities patient will:  - take medications as prescribed as evidenced by patient report and record review check glucose using libre 2 CGM, document, and provide at future appointments collaborate with provider on medication access solutions      Medication Assistance: Application for novo nordisk re enrollment  medication assistance program. in process.  Anticipated assistance start date tbd.  See plan of care for additional detail.  Plan: Telephone follow up appointment with care management team member scheduled for:  07/2022    JRegina Eck PharmD, BCPS Clinical Pharmacist, [facility] Phone [phone]

## 2022-07-14 ENCOUNTER — Other Ambulatory Visit: Payer: Self-pay | Admitting: Family Medicine

## 2022-07-14 DIAGNOSIS — F411 Generalized anxiety disorder: Secondary | ICD-10-CM

## 2022-07-14 DIAGNOSIS — F339 Major depressive disorder, recurrent, unspecified: Secondary | ICD-10-CM

## 2022-07-24 DIAGNOSIS — Z794 Long term (current) use of insulin: Secondary | ICD-10-CM

## 2022-07-24 DIAGNOSIS — E1165 Type 2 diabetes mellitus with hyperglycemia: Secondary | ICD-10-CM

## 2022-07-27 ENCOUNTER — Telehealth: Payer: PPO

## 2022-07-27 ENCOUNTER — Telehealth: Payer: Self-pay | Admitting: Pharmacist

## 2022-07-27 NOTE — Telephone Encounter (Signed)
  Care Management   Follow Up Note   07/27/2022 Name: JAINE ESTABROOKS MRN: [id number] DOB: April 02, 1951   Referred by: Gwenlyn Perking, FNP Reason for referral : Appointment  Patient is currently sick with a viral illness.  Will reviewed medications at a later date.  Encouraged patient to call to schedule appt as needed    Regina Eck, PharmD, BCPS Clinical Pharmacist, [place]  II Phone [phone]

## 2022-07-31 DIAGNOSIS — Z515 Encounter for palliative care: Secondary | ICD-10-CM | POA: Diagnosis not present

## 2022-07-31 DIAGNOSIS — F331 Major depressive disorder, recurrent, moderate: Secondary | ICD-10-CM | POA: Diagnosis not present

## 2022-08-27 DIAGNOSIS — Z1231 Encounter for screening mammogram for malignant neoplasm of breast: Secondary | ICD-10-CM | POA: Diagnosis not present

## 2022-08-31 ENCOUNTER — Telehealth: Payer: PPO | Admitting: Pharmacist

## 2022-08-31 ENCOUNTER — Telehealth: Payer: Self-pay | Admitting: Pharmacist

## 2022-08-31 NOTE — Telephone Encounter (Signed)
Please re enroll for  Tresiba 60 units Novolog 30 units 3 times daily with meals max 90u/day Ozempic '1mg'$   Online or mail is fine just let me know! I told her to check her mail, but whatever is easier for you!

## 2022-09-06 DIAGNOSIS — R922 Inconclusive mammogram: Secondary | ICD-10-CM | POA: Diagnosis not present

## 2022-09-06 DIAGNOSIS — N6312 Unspecified lump in the right breast, upper inner quadrant: Secondary | ICD-10-CM | POA: Diagnosis not present

## 2022-09-10 ENCOUNTER — Encounter: Payer: Self-pay | Admitting: Family Medicine

## 2022-09-18 ENCOUNTER — Telehealth: Payer: Self-pay | Admitting: Family Medicine

## 2022-09-18 NOTE — Telephone Encounter (Signed)
Patient wants to let Almyra Free know that she has not received the paperwork that they discussed and it had been around three weeks.Please call back

## 2022-09-19 NOTE — Telephone Encounter (Signed)
Attempted to call pt to inform that novo nordisk application was submitted online 09/06/22. Process could take 4-6 weeks.  No voicemail available for either number.

## 2022-09-20 ENCOUNTER — Ambulatory Visit (INDEPENDENT_AMBULATORY_CARE_PROVIDER_SITE_OTHER): Payer: PPO | Admitting: Family Medicine

## 2022-09-20 ENCOUNTER — Encounter: Payer: Self-pay | Admitting: Family Medicine

## 2022-09-20 ENCOUNTER — Telehealth: Payer: Self-pay | Admitting: Family Medicine

## 2022-09-20 VITALS — BP 140/68 | HR 60 | Temp 98.6°F | Ht 66.0 in | Wt 163.0 lb

## 2022-09-20 DIAGNOSIS — I509 Heart failure, unspecified: Secondary | ICD-10-CM

## 2022-09-20 DIAGNOSIS — E1165 Type 2 diabetes mellitus with hyperglycemia: Secondary | ICD-10-CM

## 2022-09-20 DIAGNOSIS — F411 Generalized anxiety disorder: Secondary | ICD-10-CM

## 2022-09-20 DIAGNOSIS — N1831 Chronic kidney disease, stage 3a: Secondary | ICD-10-CM | POA: Diagnosis not present

## 2022-09-20 DIAGNOSIS — F339 Major depressive disorder, recurrent, unspecified: Secondary | ICD-10-CM | POA: Diagnosis not present

## 2022-09-20 DIAGNOSIS — Z794 Long term (current) use of insulin: Secondary | ICD-10-CM | POA: Diagnosis not present

## 2022-09-20 DIAGNOSIS — I25110 Atherosclerotic heart disease of native coronary artery with unstable angina pectoris: Secondary | ICD-10-CM | POA: Diagnosis not present

## 2022-09-20 LAB — BAYER DCA HB A1C WAIVED: HB A1C (BAYER DCA - WAIVED): 9.4 % — ABNORMAL HIGH (ref 4.8–5.6)

## 2022-09-20 MED ORDER — SERTRALINE HCL 100 MG PO TABS: 100.0000 mg | ORAL_TABLET | Freq: Every day | ORAL | 3 refills | Status: DC

## 2022-09-20 NOTE — Patient Instructions (Addendum)
Schedule appointment with cardiology for follow up.   Increase tresiba by 2-3 units every 2-3 days for fasting blood sugars greater than 150 up to 70 units daily. Follow up with Almyra Free in 2-4 weeks for further changes.

## 2022-09-20 NOTE — Progress Notes (Signed)
Established Patient Office Visit  Subjective:  Patient ID: [name], female    DOB: Jun 21, 1951  Age: 71 y.o. MRN: [id number]  CC:  Chief Complaint  Patient presents with   Medical Management of Chronic Issues    3 month    HPI [name] presents for chronic follow up.   1. DM Patient denies foot ulcerations, paresthesia of the feet, vomiting and weight loss.   Current diabetic medications include tresiba 60 units daily, novolog 30 units with meals BID (only eats 2 meals a day), ozempic 1 mg Compliant with meds - Yes   Current monitoring regimen: home blood tests - CGM Home blood sugar records: above target the majority of the time Any episodes of hypoglycemia? occasionally   Eye exam current (within one year): yes Weight trend: stable   Is She on ACE inhibitor or angiotensin II receptor blocker?  No Is She on statin? Yes crestor   2. HTN Complaint with meds - Yes Current Medications - norvasc, atenolol, lasix, imdur Checking BP at home: yes, reports 130s/80s Pertinent ROS:  Headache - No Fatigue - No Visual Disturbances - No Chest pain - No Dyspnea - No Palpitations - No LE edema - No   3. Depression, anxiety She reports feeling the best that she has felt in a long time. Currently on zoloft 100 mg daily.      09/20/2022    8:28 AM 06/20/2022    8:17 AM 05/07/2022    8:02 AM  Depression screen PHQ 2/9  Decreased Interest _0 Down, Depressed, Hopeless _1 PHQ - 2 Score _2 Altered sleeping _3 Tired, decreased energy _4 Change in appetite _5 Feeling bad or failure about yourself  1 0 0  Trouble concentrating 0 0 2  Moving slowly or fidgety/restless 0 0 1  Suicidal thoughts 0 0 0  PHQ-9 Score _6 Difficult doing work/chores Somewhat difficult Somewhat difficult Very difficult      09/20/2022    8:28 AM 06/20/2022    8:17 AM 05/07/2022    8:03 AM 04/12/2022    9:28 AM  GAD 7 : Generalized Anxiety Score  Nervous,  Anxious, on Edge _7 Control/stop worrying _8 Worry too much - different things _9 Trouble relaxing _10 Restless _11 Easily annoyed or irritable _12 Afraid - awful might happen _13 0  Total GAD 7 Score _14 Anxiety Difficulty Somewhat difficult Somewhat difficult Very difficult Very difficult     Past Medical History:  Diagnosis Date   Anxiety    CAD (coronary artery disease)    DES to circumflex 02/2007, BMS to LAD and PTCA diagonal 03/2007   Carotid artery plaque    Mild   Cataract    Depression    Diverticulitis, colon    Elevated d-dimer 01/08/2014   Essential hypertension, benign    GERD (gastroesophageal reflux disease)    H/O hiatal hernia    HLD (hyperlipidemia)    IDDM (insulin dependent diabetes mellitus)    Migraine    "used to have them really bad; don't have them anymore" (01/07/2014)   MS (multiple sclerosis) (HCC)    Not confirmed   PAT (paroxysmal atrial tachycardia)    Prolapse of  uterus    PVD (peripheral vascular disease) (HCC)    TIA (transient ischemic attack) 1980's    Past Surgical History:  Procedure Laterality Date   ABDOMINAL HYSTERECTOMY  1986   ovaries remain - prolaspe uterus    APPENDECTOMY  ~ 1970   BREAST BIOPSY Right 1980's   BREAST LUMPECTOMY Right 1980's   Dr. Charlynne Pander    CARDIAC CATHETERIZATION  01/07/2014   CHOLECYSTECTOMY  ?1987   COLONOSCOPY  2002   Dr. Anwar--> Severe diverticular changes in the region of the sigmoid and descending colon with scattered diverticular changes throughout the rest of the colon. No polyps, ulcerations. Despite numerous manipulations, the tip of the scope could not be tipped into the cecal area.   COLONOSCOPY  01/10/2012   Procedure: COLONOSCOPY;  Surgeon: Daneil Dolin, MD;  Location: [facility];  Service: Endoscopy;  Laterality: N/A;  1:55   CORONARY ANGIOPLASTY WITH STENT PLACEMENT  ~ 1997 X 2   "2 + 1"   CORONARY STENT INTERVENTION N/A 03/26/2022    Procedure: CORONARY STENT INTERVENTION;  Surgeon: Lorretta Harp, MD;  Location: [place] [facility];  Service: Cardiovascular;  Laterality: N/A;   EYE SURGERY Bilateral 2014   cataract   INTRAVASCULAR PRESSURE WIRE/FFR STUDY N/A 03/08/2017   Procedure: Intravascular Pressure Wire/FFR Study;  Surgeon: Nelva Bush, MD;  Location: [place] [facility];  Service: Cardiovascular;  Laterality: N/A;   LEFT HEART CATH AND CORONARY ANGIOGRAPHY N/A 03/08/2017   Procedure: Left Heart Cath and Coronary Angiography;  Surgeon: Nelva Bush, MD;  Location: [place] [facility];  Service: Cardiovascular;  Laterality: N/A;   LEFT HEART CATH AND CORONARY ANGIOGRAPHY N/A 03/26/2022   Procedure: LEFT HEART CATH AND CORONARY ANGIOGRAPHY;  Surgeon: Lorretta Harp, MD;  Location: [place];  Service: Cardiovascular;  Laterality: N/A;   LEFT HEART CATHETERIZATION WITH CORONARY ANGIOGRAM N/A 01/07/2014   Procedure: LEFT HEART CATHETERIZATION WITH CORONARY ANGIOGRAM;  Surgeon: Larey Dresser, MD;  Location: [facility];  Service: Cardiovascular;  Laterality: N/A;    Family History  Problem Relation Age of Onset   Heart attack Mother 47   Diabetes Mother    Hypertension Mother    Heart attack Father 93   Heart attack Brother 23       x 6   Heart disease Brother    Diabetes Brother    Colon cancer Paternal Aunt        90s, died with brain anuerysm   Crohn's disease Cousin        paternal   Diabetes Sister    GER disease Daughter    Cervical cancer Daughter    Diabetes Daughter     Social History   Socioeconomic History   Marital status: Widowed    Spouse name: Not on file   Number of children: 4   Years of education: 12   Highest education level: 11th grade  Occupational History   Occupation: Disability    Employer: DISABLED  Tobacco Use   Smoking status: Never   Smokeless tobacco: Never   Tobacco comments:    spouse, 3 years - husband has quit 01/2011  Vaping Use   Vaping Use:  Never used  Substance and Sexual Activity   Alcohol use: No   Drug use: No   Sexual activity: Not Currently  Other Topics Concern   Not on file  Social History Narrative   Lives alone, one level, handicap accessible bathroom, her children  all live nearby   Social Determinants of Health   Financial Resource Strain: Low Risk  (01/22/2022)   Overall Financial Resource Strain (CARDIA)    Difficulty of Paying Living Expenses: Not very hard  Food Insecurity: No Food Insecurity (01/22/2022)   Hunger Vital Sign    Worried About Running Out of Food in the Last Year: Never true    Ran Out of Food in the Last Year: Never true  Transportation Needs: No Transportation Needs (01/22/2022)   PRAPARE - Hydrologist (Medical): No    Lack of Transportation (Non-Medical): No  Physical Activity: Insufficiently Active (01/22/2022)   Exercise Vital Sign    Days of Exercise per Week: 7 days    Minutes of Exercise per Session: 20 min  Stress: No Stress Concern Present (01/22/2022)   [place]    Feeling of Stress : Only a little  Social Connections: Moderately Integrated (01/22/2022)   Social Connection and Isolation Panel [NHANES]    Frequency of Communication with Friends and Family: More than three times a week    Frequency of Social Gatherings with Friends and Family: More than three times a week    Attends Religious Services: More than 4 times per year    Active Member of [employer] or Organizations: Yes    Attends Archivist Meetings: More than 4 times per year    Marital Status: Widowed  Intimate Partner Violence: Not At Risk (01/22/2022)   Humiliation, Afraid, Rape, and Kick questionnaire    Fear of Current or Ex-Partner: No    Emotionally Abused: No    Physically Abused: No    Sexually Abused: No    Outpatient Medications Prior to Visit  Medication Sig Dispense Refill   amLODipine (NORVASC) 10 MG  tablet Take 1 tablet (10 mg total) by mouth daily. 90 tablet 3   aspirin 81 MG chewable tablet Chew 1 tablet (81 mg total) by mouth daily. 90 tablet 3   atenolol (TENORMIN) 50 MG tablet Take 1 tablet (50 mg total) by mouth 2 (two) times daily. 180 tablet 3   clopidogrel (PLAVIX) 75 MG tablet Take 1 tablet (75 mg total) by mouth daily with breakfast. 90 tablet 3   Continuous Blood Gluc Receiver (FREESTYLE LIBRE 2 READER) DEVI USE TO TEST BLOOD SUGAR 6 TIMES DAILY. Dx:E11.65 1 each 0   Continuous Blood Gluc Sensor (FREESTYLE LIBRE 2 SENSOR) MISC USE TO TEST BLOOD SUGAR 6 TIMES DAILY 2 each 11   furosemide (LASIX) 40 MG tablet Take 1 tablet (40 mg total) by mouth daily. 90 tablet 3   insulin degludec (TRESIBA FLEXTOUCH) 100 UNIT/ML FlexTouch Pen Inject 60 Units into the skin daily.     isosorbide mononitrate (IMDUR) 60 MG 24 hr tablet Take 1 tablet (60 mg total) by mouth daily. 90 tablet 3   nitroGLYCERIN (NITROSTAT) 0.4 MG SL tablet DISSOLVE 1 TABLET UNDER TONGUE FOR CHESTPAIN.MAY REPEAT EVERY 5 MINUTES FOR 3 DOSES.IF NO RELIEF CALL 911 OR GO TO ER 25 tablet 3   NOVOLOG FLEXPEN 100 UNIT/ML FlexPen INJECT 35-40 UNITS SQ 3 TIMES DAILY WITH MEALS (Patient taking differently: Inject 20-30 Units into the skin 3 (three) times daily with meals.) 30 mL 3   potassium chloride (KLOR-CON M) 10 MEQ tablet Take 1 tablet (10 mEq total) by mouth 2 (two) times daily. 80 tablet 1   rOPINIRole (REQUIP) 0.5 MG tablet Take 2 tablets (1 mg total) by  mouth at bedtime. (Patient taking differently: Take 1 mg by mouth daily as needed (restless leg).) 180 tablet 2   rosuvastatin (CRESTOR) 20 MG tablet TAKE ONE (1) TABLET BY MOUTH EVERY DAY 90 tablet 0   Semaglutide, 1 MG/DOSE, (OZEMPIC, 1 MG/DOSE,) 2 MG/1.5ML SOPN Inject 1 mg into the skin once a week.     sertraline (ZOLOFT) 100 MG tablet Take 1 tablet (100 mg total) by mouth daily. 90 tablet 0   traZODone (DESYREL) 100 MG tablet Take 1 tablet (100 mg total) by mouth at  bedtime as needed for sleep. 90 tablet 1   No facility-administered medications prior to visit.    Allergies  Allergen Reactions   Iohexol      Desc: pt had syncopal episode with nausea post IV CM late 1990's,  pt has had prednisone prep with heart caths x 2 without problem  kdean 04/16/07, Onset Date: [id number]    Ticlid [Ticlopidine Hcl] Nausea And Vomiting   Jardiance [Empagliflozin] Other (See Comments)    Recurrent UTIs   Metformin And Related Diarrhea   Codeine Nausea And Vomiting and Palpitations    ROS Review of Systems As per HPI.   Objective:    Physical Exam Vitals and nursing note reviewed.  Constitutional:      General: She is not in acute distress.    Appearance: She is not ill-appearing, toxic-appearing or diaphoretic.  HENT:     Head: Normocephalic and atraumatic.  Neck:     Thyroid: No thyroid mass, thyromegaly or thyroid tenderness.  Cardiovascular:     Rate and Rhythm: Normal rate and regular rhythm.     Heart sounds: Normal heart sounds. No murmur heard. Pulmonary:     Effort: Pulmonary effort is normal. No respiratory distress.     Breath sounds: Normal breath sounds.  Abdominal:     General: Bowel sounds are normal. There is no distension.     Palpations: Abdomen is soft.     Tenderness: There is no abdominal tenderness. There is no right CVA tenderness, left CVA tenderness, guarding or rebound.  Musculoskeletal:     Cervical back: Neck supple. No rigidity.     Right lower leg: No edema.     Left lower leg: No edema.  Skin:    General: Skin is warm and dry.  Neurological:     General: No focal deficit present.     Mental Status: She is alert and oriented to person, place, and time.     Gait: Gait normal.  Psychiatric:        Mood and Affect: Mood normal.        Behavior: Behavior normal.        Thought Content: Thought content normal.        Judgment: Judgment normal.     BP (!) 140/68   Pulse 60   Temp 98.6 F (37 C)   Ht _0   (1.676 m)   Wt 163 lb (73.9 kg)   SpO2 99%   BMI 26.31 kg/m  Wt Readings from Last 3 Encounters:  09/20/22 163 lb (73.9 kg)  06/20/22 155 lb 8 oz (70.5 kg)  05/07/22 153 lb 6 oz (69.6 kg)    Health Maintenance Due  Topic Date Due   Diabetic kidney evaluation - Urine ACR  08/09/2022    There are no preventive care reminders to display for this patient.  Lab Results  Component Value Date   TSH 2.110 12/11/2021   Lab Results  Component Value Date   WBC 5.4 06/20/2022   HGB 11.0 (L) 06/20/2022   HCT 33.5 (L) 06/20/2022   MCV 89 06/20/2022   PLT 190 06/20/2022   Lab Results  Component Value Date   NA 141 06/20/2022   K 4.1 06/29/2022   CO2 32 (H) 06/20/2022   GLUCOSE 154 (H) 06/20/2022   BUN 8 06/20/2022   CREATININE 0.78 06/20/2022   BILITOT 0.3 06/20/2022   ALKPHOS 61 06/20/2022   AST 14 06/20/2022   ALT 9 06/20/2022   PROT 6.3 06/20/2022   ALBUMIN 3.4 (L) 06/20/2022   CALCIUM 8.6 (L) 06/20/2022   ANIONGAP 11 03/27/2022   EGFR 80 06/20/2022   Lab Results  Component Value Date   CHOL 128 06/20/2022   Lab Results  Component Value Date   HDL 37 (L) 06/20/2022   Lab Results  Component Value Date   LDLCALC 71 06/20/2022   Lab Results  Component Value Date   TRIG 105 06/20/2022   Lab Results  Component Value Date   CHOLHDL 3.5 06/20/2022   Lab Results  Component Value Date   HGBA1C 7.4 (H) 06/20/2022      Assessment & Plan:   Bridgette was seen today for medical management of chronic issues.  Diagnoses and all orders for this visit:  Type 2 diabetes mellitus with hyperglycemia, with long-term current use of insulin ([place]) Uncontrolled with A1c of 9.4 today. Increase tresiba by 2-3 units very 2-3 days for fasting blood sugars >150 up to 70u a day. Will have patient follow up with Almyra Free for further management. Continue mealtime insulin, ozempic. Discussed compliance with diet. On statin. Urine micro today. Foot exam and eye exam are UTD.  -     [facility]ayer  DCA Hb A1c Waived -     CBC with Differential/Platelet -     CMP14+EGFR -     Lipid panel -     Microalbumin / creatinine urine ratio  Stage 3a chronic kidney disease (HCC) Labs pending.  -     CBC with Differential/Platelet -     CMP14+EGFR  Chronic congestive heart failure, unspecified heart failure type (HCC) Coronary artery disease involving native coronary artery of native heart with unstable angina pectoris ([place]) Managed by cardiology. On aspirin, amlodipine, plavix, lasix, imdur. Reminded to schedule follow up with cards.  -     CBC with Differential/Platelet -     CMP14+EGFR -     Lipid panel  Generalized anxiety disorder Depression, recurrent (HCC) Well controlled on current regimen.  -     sertraline (ZOLOFT) 100 MG tablet; Take 1 tablet (100 mg total) by mouth daily.  Return in about 3 months (around 12/20/2022) for chronic follow up, follow up with Almyra Free in 2-4 weeks.   The patient indicates understanding of these issues and agrees with the plan.   Gwenlyn Perking, FNP

## 2022-09-21 LAB — CBC WITH DIFFERENTIAL/PLATELET
Basophils Absolute: 0 x10E3/uL (ref 0.0–0.2)
Basos: 1 %
EOS (ABSOLUTE): 0.2 x10E3/uL (ref 0.0–0.4)
Eos: 3 %
Hematocrit: 33.4 % — ABNORMAL LOW (ref 34.0–46.6)
Hemoglobin: 11 g/dL — ABNORMAL LOW (ref 11.1–15.9)
Immature Grans (Abs): 0 x10E3/uL (ref 0.0–0.1)
Immature Granulocytes: 0 %
Lymphocytes Absolute: 1.9 x10E3/uL (ref 0.7–3.1)
Lymphs: 32 %
MCH: 28.7 pg (ref 26.6–33.0)
MCHC: 32.9 g/dL (ref 31.5–35.7)
MCV: 87 fL (ref 79–97)
Monocytes Absolute: 0.4 x10E3/uL (ref 0.1–0.9)
Monocytes: 6 %
Neutrophils Absolute: 3.5 x10E3/uL (ref 1.4–7.0)
Neutrophils: 58 %
Platelets: 166 x10E3/uL (ref 150–450)
RBC: 3.83 x10E6/uL (ref 3.77–5.28)
RDW: 14.4 % (ref 11.7–15.4)
WBC: 5.9 x10E3/uL (ref 3.4–10.8)

## 2022-09-21 LAB — CMP14+EGFR
ALT: 6 IU/L (ref 0–32)
AST: 10 IU/L (ref 0–40)
Albumin/Globulin Ratio: 1.5 (ref 1.2–2.2)
Albumin: 3.8 g/dL (ref 3.8–4.8)
Alkaline Phosphatase: 65 IU/L (ref 44–121)
BUN/Creatinine Ratio: 14 (ref 12–28)
BUN: 14 mg/dL (ref 8–27)
Bilirubin Total: 0.3 mg/dL (ref 0.0–1.2)
CO2: 25 mmol/L (ref 20–29)
Calcium: 9.1 mg/dL (ref 8.7–10.3)
Chloride: 102 mmol/L (ref 96–106)
Creatinine, Ser: 1.01 mg/dL — ABNORMAL HIGH (ref 0.57–1.00)
Globulin, Total: 2.5 g/dL (ref 1.5–4.5)
Glucose: 167 mg/dL — ABNORMAL HIGH (ref 70–99)
Potassium: 3.7 mmol/L (ref 3.5–5.2)
Sodium: 142 mmol/L (ref 134–144)
Total Protein: 6.3 g/dL (ref 6.0–8.5)
eGFR: 59 mL/min/1.73 — ABNORMAL LOW

## 2022-09-21 LAB — LIPID PANEL
Chol/HDL Ratio: 2.9 ratio (ref 0.0–4.4)
Cholesterol, Total: 124 mg/dL (ref 100–199)
HDL: 43 mg/dL (ref >39)
LDL Chol Calc (NIH): 64 mg/dL (ref 0–99)
Triglycerides: 90 mg/dL (ref 0–149)
VLDL Cholesterol Cal: 17 mg/dL (ref 5–40)

## 2022-09-21 LAB — MICROALBUMIN / CREATININE URINE RATIO
Creatinine, Urine: 101.4 mg/dL
Microalb/Creat Ratio: 17 mg/g creat (ref 0–29)
Microalbumin, Urine: 17.5 ug/mL

## 2022-10-03 ENCOUNTER — Encounter: Payer: Self-pay | Admitting: Family Medicine

## 2022-10-04 ENCOUNTER — Ambulatory Visit (INDEPENDENT_AMBULATORY_CARE_PROVIDER_SITE_OTHER): Payer: PPO | Admitting: Pharmacist

## 2022-10-04 ENCOUNTER — Other Ambulatory Visit: Payer: Self-pay

## 2022-10-04 ENCOUNTER — Emergency Department (HOSPITAL_COMMUNITY): Payer: PPO

## 2022-10-04 ENCOUNTER — Inpatient Hospital Stay (HOSPITAL_COMMUNITY)
Admission: EM | Admit: 2022-10-04 | Discharge: 2022-10-06 | DRG: 251 | Disposition: A | Discharge status: Home / Self Care | Payer: PPO | Attending: Internal Medicine | Admitting: Internal Medicine

## 2022-10-04 DIAGNOSIS — Z833 Family history of diabetes mellitus: Secondary | ICD-10-CM | POA: Diagnosis not present

## 2022-10-04 DIAGNOSIS — Z885 Allergy status to narcotic agent status: Secondary | ICD-10-CM | POA: Diagnosis not present

## 2022-10-04 DIAGNOSIS — R079 Chest pain, unspecified: Secondary | ICD-10-CM | POA: Diagnosis present

## 2022-10-04 DIAGNOSIS — Z90710 Acquired absence of both cervix and uterus: Secondary | ICD-10-CM | POA: Diagnosis not present

## 2022-10-04 DIAGNOSIS — Z794 Long term (current) use of insulin: Secondary | ICD-10-CM

## 2022-10-04 DIAGNOSIS — F32A Depression, unspecified: Secondary | ICD-10-CM | POA: Diagnosis present

## 2022-10-04 DIAGNOSIS — I2510 Atherosclerotic heart disease of native coronary artery without angina pectoris: Secondary | ICD-10-CM | POA: Diagnosis present

## 2022-10-04 DIAGNOSIS — Z961 Presence of intraocular lens: Secondary | ICD-10-CM | POA: Diagnosis present

## 2022-10-04 DIAGNOSIS — I10 Essential (primary) hypertension: Secondary | ICD-10-CM | POA: Diagnosis not present

## 2022-10-04 DIAGNOSIS — Z9841 Cataract extraction status, right eye: Secondary | ICD-10-CM | POA: Diagnosis not present

## 2022-10-04 DIAGNOSIS — E1151 Type 2 diabetes mellitus with diabetic peripheral angiopathy without gangrene: Secondary | ICD-10-CM | POA: Diagnosis present

## 2022-10-04 DIAGNOSIS — Z888 Allergy status to other drugs, medicaments and biological substances status: Secondary | ICD-10-CM | POA: Diagnosis not present

## 2022-10-04 DIAGNOSIS — Z8673 Personal history of transient ischemic attack (TIA), and cerebral infarction without residual deficits: Secondary | ICD-10-CM | POA: Diagnosis not present

## 2022-10-04 DIAGNOSIS — Z79899 Other long term (current) drug therapy: Secondary | ICD-10-CM

## 2022-10-04 DIAGNOSIS — T82855A Stenosis of coronary artery stent, initial encounter: Secondary | ICD-10-CM | POA: Diagnosis present

## 2022-10-04 DIAGNOSIS — E785 Hyperlipidemia, unspecified: Secondary | ICD-10-CM | POA: Diagnosis present

## 2022-10-04 DIAGNOSIS — I200 Unstable angina: Secondary | ICD-10-CM | POA: Diagnosis not present

## 2022-10-04 DIAGNOSIS — Z9049 Acquired absence of other specified parts of digestive tract: Secondary | ICD-10-CM | POA: Diagnosis not present

## 2022-10-04 DIAGNOSIS — G2581 Restless legs syndrome: Secondary | ICD-10-CM

## 2022-10-04 DIAGNOSIS — I25110 Atherosclerotic heart disease of native coronary artery with unstable angina pectoris: Secondary | ICD-10-CM | POA: Diagnosis present

## 2022-10-04 DIAGNOSIS — E1165 Type 2 diabetes mellitus with hyperglycemia: Secondary | ICD-10-CM | POA: Diagnosis present

## 2022-10-04 DIAGNOSIS — Z7982 Long term (current) use of aspirin: Secondary | ICD-10-CM | POA: Diagnosis not present

## 2022-10-04 DIAGNOSIS — R072 Precordial pain: Secondary | ICD-10-CM | POA: Diagnosis not present

## 2022-10-04 DIAGNOSIS — Z8249 Family history of ischemic heart disease and other diseases of the circulatory system: Secondary | ICD-10-CM | POA: Diagnosis not present

## 2022-10-04 DIAGNOSIS — I700 Atherosclerosis of aorta: Secondary | ICD-10-CM | POA: Diagnosis not present

## 2022-10-04 DIAGNOSIS — G35 Multiple sclerosis: Secondary | ICD-10-CM | POA: Diagnosis present

## 2022-10-04 DIAGNOSIS — R0789 Other chest pain: Secondary | ICD-10-CM | POA: Diagnosis not present

## 2022-10-04 DIAGNOSIS — K219 Gastro-esophageal reflux disease without esophagitis: Secondary | ICD-10-CM | POA: Diagnosis present

## 2022-10-04 DIAGNOSIS — Z9842 Cataract extraction status, left eye: Secondary | ICD-10-CM

## 2022-10-04 DIAGNOSIS — R0689 Other abnormalities of breathing: Secondary | ICD-10-CM | POA: Diagnosis not present

## 2022-10-04 DIAGNOSIS — I110 Hypertensive heart disease with heart failure: Secondary | ICD-10-CM | POA: Diagnosis present

## 2022-10-04 DIAGNOSIS — Z7902 Long term (current) use of antithrombotics/antiplatelets: Secondary | ICD-10-CM

## 2022-10-04 DIAGNOSIS — Z800 Family history of malignant neoplasm of digestive organs: Secondary | ICD-10-CM

## 2022-10-04 DIAGNOSIS — Z91041 Radiographic dye allergy status: Secondary | ICD-10-CM | POA: Diagnosis not present

## 2022-10-04 DIAGNOSIS — I5032 Chronic diastolic (congestive) heart failure: Secondary | ICD-10-CM | POA: Diagnosis present

## 2022-10-04 DIAGNOSIS — F419 Anxiety disorder, unspecified: Secondary | ICD-10-CM | POA: Diagnosis present

## 2022-10-04 DIAGNOSIS — Y831 Surgical operation with implant of artificial internal device as the cause of abnormal reaction of the patient, or of later complication, without mention of misadventure at the time of the procedure: Secondary | ICD-10-CM | POA: Diagnosis present

## 2022-10-04 DIAGNOSIS — Z8049 Family history of malignant neoplasm of other genital organs: Secondary | ICD-10-CM

## 2022-10-04 LAB — CBG MONITORING, ED: Glucose-Capillary: 271 mg/dL — ABNORMAL HIGH (ref 70–99)

## 2022-10-04 MED ORDER — NITROGLYCERIN 0.4 MG SL SUBL: 0.4000 mg | SUBLINGUAL_TABLET | SUBLINGUAL | Status: DC | PRN

## 2022-10-04 NOTE — Telephone Encounter (Signed)
Can we check on novo status? I didn't see updates on spreadsheet, but I know things are busy!!  Patient answered today if you need to call her

## 2022-10-04 NOTE — ED Provider Notes (Signed)
[facility] EMERGENCY DEPARTMENT Provider Note   CSN: [id number] Arrival date & time: 10/04/22  2318     History  Chief Complaint  Patient presents with   Chest Pain    [name] is a 72 y.o. female.  72 yo F with a chief complaints of chest pain.  Feels like a pressure and radiates into the left arm.  Going on for couple days now.  Nothing seems to make it better or worse.  Short of breath with it.  Feels just like when she was admitted to the hospital and had a cardiac catheterization and had 2 stents placed.  She also has had some dark stool going on for about 4 days.  Had an episode of bloody emesis yesterday.  Denies abdominal pain.  Denies fevers denies cough or congestion.   Chest Pain      Home Medications Prior to Admission medications   Medication Sig Start Date End Date Taking? Authorizing Provider  amLODipine (NORVASC) 10 MG tablet Take 1 tablet (10 mg total) by mouth daily. 03/27/22   Rai, Vernelle Emerald, MD  aspirin 81 MG chewable tablet Chew 1 tablet (81 mg total) by mouth daily. 03/28/22   Rai, Vernelle Emerald, MD  atenolol (TENORMIN) 50 MG tablet Take 1 tablet (50 mg total) by mouth 2 (two) times daily. 05/25/22   Gwenlyn Perking, FNP  clopidogrel (PLAVIX) 75 MG tablet Take 1 tablet (75 mg total) by mouth daily with breakfast. 03/28/22   Rai, Ripudeep K, MD  Continuous Blood Gluc Receiver (FREESTYLE LIBRE 2 READER) DEVI USE TO TEST BLOOD SUGAR 6 TIMES DAILY. Dx:E11.65 05/19/21   Gwenlyn Perking, FNP  Continuous Blood Gluc Sensor (FREESTYLE LIBRE 2 SENSOR) MISC USE TO TEST BLOOD SUGAR 6 TIMES DAILY 02/23/22   Gwenlyn Perking, FNP  furosemide (LASIX) 40 MG tablet Take 1 tablet (40 mg total) by mouth daily. 04/12/22   Gwenlyn Perking, FNP  insulin degludec (TRESIBA FLEXTOUCH) 100 UNIT/ML FlexTouch Pen Inject 60 Units into the skin daily.    [provider]  isosorbide mononitrate (IMDUR) 60 MG 24 hr tablet Take 1 tablet (60 mg total) by mouth  daily. 03/27/22   Rai, Ripudeep K, MD  nitroGLYCERIN (NITROSTAT) 0.4 MG SL tablet DISSOLVE 1 TABLET UNDER TONGUE FOR CHESTPAIN.MAY REPEAT EVERY 5 MINUTES FOR 3 DOSES.IF NO RELIEF CALL 911 OR GO TO ER 03/27/22   Rai, Ripudeep K, MD  NOVOLOG FLEXPEN 100 UNIT/ML FlexPen INJECT 35-40 UNITS SQ 3 TIMES DAILY WITH MEALS Patient taking differently: Inject 20-30 Units into the skin 3 (three) times daily with meals. 09/15/19   Dettinger, Fransisca Kaufmann, MD  potassium chloride (KLOR-CON M) 10 MEQ tablet Take 1 tablet (10 mEq total) by mouth 2 (two) times daily. 06/21/22   Gwenlyn Perking, FNP  rOPINIRole (REQUIP) 0.5 MG tablet Take 2 tablets (1 mg total) by mouth at bedtime. Patient taking differently: Take 1 mg by mouth daily as needed (restless leg). 01/09/21   Gwenlyn Perking, FNP  rosuvastatin (CRESTOR) 20 MG tablet TAKE ONE (1) TABLET BY MOUTH EVERY DAY 06/14/22   Gwenlyn Perking, FNP  Semaglutide, 1 MG/DOSE, (OZEMPIC, 1 MG/DOSE,) 2 MG/1.5ML SOPN Inject 1 mg into the skin once a week.    [provider]  sertraline (ZOLOFT) 100 MG tablet Take 1 tablet (100 mg total) by mouth daily. 09/20/22   Gwenlyn Perking, FNP  traZODone (DESYREL) 100 MG tablet Take 1 tablet (100 mg total)  by mouth at bedtime as needed for sleep. 01/15/22   Gwenlyn Perking, FNP      Allergies    Iohexol, Ticlid [ticlopidine hcl], Jardiance [empagliflozin], Metformin and related, and Codeine    Review of Systems   Review of Systems  Cardiovascular:  Positive for chest pain.    Physical Exam Updated Vital Signs BP (!) 131/56   Pulse 70   Temp 97.9 F (36.6 C) (Oral)   Resp 15   Ht '5\' 6"'$  (1.676 m)   Wt 72.6 kg   SpO2 99%   BMI 25.82 kg/m  Physical Exam Vitals and nursing note reviewed.  Constitutional:      General: She is not in acute distress.    Appearance: She is well-developed. She is not diaphoretic.  HENT:     Head: Normocephalic and atraumatic.  Eyes:     Pupils: Pupils are equal, round, and reactive  to light.  Cardiovascular:     Rate and Rhythm: Normal rate and regular rhythm.     Heart sounds: No murmur heard.    No friction rub. No gallop.  Pulmonary:     Effort: Pulmonary effort is normal.     Breath sounds: No wheezing or rales.  Abdominal:     General: There is no distension.     Palpations: Abdomen is soft.     Tenderness: There is no abdominal tenderness.  Musculoskeletal:        General: No tenderness.     Cervical back: Normal range of motion and neck supple.  Skin:    General: Skin is warm and dry.  Neurological:     Mental Status: She is alert and oriented to person, place, and time.  Psychiatric:        Behavior: Behavior normal.     ED Results / Procedures / Treatments   Labs (all labs ordered are listed, but only abnormal results are displayed) Labs Reviewed  BASIC METABOLIC PANEL - Abnormal; Notable for the following components:      Result Value   Glucose, Bld 266 (*)    Creatinine, Ser 1.22 (*)    Calcium 8.6 (*)    GFR, Estimated 47 (*)    All other components within normal limits  CBC - Abnormal; Notable for the following components:   RBC 3.68 (*)    Hemoglobin 10.7 (*)    HCT 32.4 (*)    All other components within normal limits  CBC - Abnormal; Notable for the following components:   RBC 3.74 (*)    Hemoglobin 11.2 (*)    HCT 33.0 (*)    All other components within normal limits  HEMOGLOBIN A1C - Abnormal; Notable for the following components:   Hgb A1c MFr Bld 8.5 (*)    All other components within normal limits  CBG MONITORING, ED - Abnormal; Notable for the following components:   Glucose-Capillary 271 (*)    All other components within normal limits  I-STAT CHEM 8, ED - Abnormal; Notable for the following components:   Glucose, Bld 262 (*)    Hemoglobin 11.2 (*)    HCT 33.0 (*)    All other components within normal limits  CBG MONITORING, ED - Abnormal; Notable for the following components:   Glucose-Capillary 249 (*)    All  other components within normal limits  CREATININE, SERUM  BASIC METABOLIC PANEL  TYPE AND SCREEN  TROPONIN I (HIGH SENSITIVITY)  TROPONIN I (HIGH SENSITIVITY)    EKG EKG Interpretation  Date/Time:  Thursday October 04 2022 23:27:09 EST Ventricular Rate:  77 PR Interval:  211 QRS Duration: 90 QT Interval:  418 QTC Calculation: 474 R Axis:   -44 Text Interpretation: Sinus rhythm Abnormal R-wave progression, late transition Inferior infarct, old No significant change since last tracing Confirmed by Deno Etienne [phone]) on 10/04/2022 11:32:14 PM  Radiology DG Chest Portable 1 View  Result Date: 10/04/2022 CLINICAL DATA:  Chest pain. EXAM: PORTABLE CHEST 1 VIEW COMPARISON:  March 25, 2022 FINDINGS: The heart size and mediastinal contours are within normal limits. Coronary artery stents are in place. There is moderate severity calcification of the aortic arch. There is no evidence of acute infiltrate, pleural effusion or pneumothorax. The visualized skeletal structures are unremarkable. IMPRESSION: No active cardiopulmonary disease. Electronically Signed   By: Virgina Norfolk M.D.   On: 10/04/2022 23:43    Procedures .Critical Care  Performed by: Deno Etienne, DO Authorized by: Deno Etienne, DO   Critical care provider statement:    Critical care time (minutes):  35   Critical care time was exclusive of:  Separately billable procedures and treating other patients   Critical care was time spent personally by me on the following activities:  Development of treatment plan with patient or surrogate, discussions with consultants, evaluation of patient's response to treatment, examination of patient, ordering and review of laboratory studies, ordering and review of radiographic studies, ordering and performing treatments and interventions, pulse oximetry, re-evaluation of patient's condition and review of old charts   Care discussed with: admitting provider       Medications Ordered in  ED Medications  nitroGLYCERIN (NITROSTAT) SL tablet 0.4 mg (has no administration in time range)  nitroGLYCERIN 50 mg in dextrose 5 % 250 mL (0.2 mg/mL) infusion (60 mcg/min Intravenous Rate/Dose Change 10/05/22 0226)  enoxaparin (LOVENOX) injection 40 mg (has no administration in time range)  insulin aspart (novoLOG) injection 0-15 Units (5 Units Subcutaneous Given 10/05/22 0451)  aspirin chewable tablet 81 mg (has no administration in time range)  atenolol (TENORMIN) tablet 12.5 mg (12.5 mg Oral Given 10/05/22 0616)  acetaminophen (TYLENOL) tablet 650 mg (has no administration in time range)  polyethylene glycol (MIRALAX / GLYCOLAX) packet 17 g (has no administration in time range)  prochlorperazine (COMPAZINE) injection 5 mg (has no administration in time range)  melatonin tablet 5 mg (has no administration in time range)  pantoprazole (PROTONIX) injection 40 mg (40 mg Intravenous Given 10/05/22 0129)    ED Course/ Medical Decision Making/ A&P                           Medical Decision Making Amount and/or Complexity of Data Reviewed Labs: ordered. Radiology: ordered.  Risk Prescription drug management. Decision regarding hospitalization.   72 yo F with a chief complaints of chest pain.  This unfortunately sounds just like when she was diagnosed with an MI and had 2 stents placed back about 5 months ago.  Going on for about 48 hours.  Her symptoms also could be symptomatic anemia she tells me that she has been having dark and tarry stools and had 1 episode of coffee-ground emesis.  Will obtain a laboratory evaluation.  Chest x-ray.  Reassess.  Patient's hemoglobin stable, troponin negative.  Chest x-ray independently interpreted by me without focal infiltrate or pneumothorax.  I discussed the case with the cardiology fellow on-call, Dr. Grayce Sessions, will come and see the patient at bedside.  Recommended medical admission with  possible upper GI bleed.  Secure chat message sent to Dr. Carlean Purl  per our hospital protocol.  Will hold off on starting heparin at this time with possible GI bleeding.  The patients results and plan were reviewed and discussed.   Any x-rays performed were independently reviewed by myself.   Differential diagnosis were considered with the presenting HPI.  Medications  nitroGLYCERIN (NITROSTAT) SL tablet 0.4 mg (has no administration in time range)  nitroGLYCERIN 50 mg in dextrose 5 % 250 mL (0.2 mg/mL) infusion (60 mcg/min Intravenous Rate/Dose Change 10/05/22 0226)  enoxaparin (LOVENOX) injection 40 mg (has no administration in time range)  insulin aspart (novoLOG) injection 0-15 Units (5 Units Subcutaneous Given 10/05/22 0451)  aspirin chewable tablet 81 mg (has no administration in time range)  atenolol (TENORMIN) tablet 12.5 mg (12.5 mg Oral Given 10/05/22 0616)  acetaminophen (TYLENOL) tablet 650 mg (has no administration in time range)  polyethylene glycol (MIRALAX / GLYCOLAX) packet 17 g (has no administration in time range)  prochlorperazine (COMPAZINE) injection 5 mg (has no administration in time range)  melatonin tablet 5 mg (has no administration in time range)  pantoprazole (PROTONIX) injection 40 mg (40 mg Intravenous Given 10/05/22 0129)    Vitals:   10/05/22 0415 10/05/22 0433 10/05/22 0500 10/05/22 0600  BP: (!) 149/75  (!) 137/108 (!) 131/56  Pulse: 67  67 70  Resp: '17  13 15  '$ Temp:  97.9 F (36.6 C)    TempSrc:  Oral    SpO2: 100%  100% 99%  Weight:      Height:        Final diagnoses:  Chest pain with high risk for cardiac etiology    Admission/ observation were discussed with the admitting physician, patient and/or family and they are comfortable with the plan.          Final Clinical Impression(s) / ED Diagnoses Final diagnoses:  Chest pain with high risk for cardiac etiology    Rx / DC Orders ED Discharge Orders     None         Deno Etienne, DO 10/05/22 [phone]

## 2022-10-04 NOTE — Progress Notes (Signed)
   Pharmacy Note   10/04/22 Name:  [name]           MRN:  [id number]      DOB:  Apr 16, 1951   Summary: Diabetes: Goal on Track (progressing): YES. Uncontrolled-A1c 7.4%-->9.4% but variable BG readings Using Libre 2 CGM  Reports variable BG (hypoglycemia around 12am, AM hyperglycemia, post prandial hyperglycemia in the 200s around lunch time) GFR 45-->80 Reports FBG 160200 Past intolerances: metformin, farxiga (SGLT2s) Current treatment: Tresiba 60 units, Novolog 20 units 2x w/ meals (only eats 2 meals daily), Ozempic '1mg'$  weekly;  PLAN: INCREASE TRESIBA to 64 units nightly; increase Novolog 30-40 units with meals (breakfast 30 u, lunch 40 un, dinner 30-35u Continue ozempic '1mg'$  weekly (consider increasing to '2mg'$  weekly, but not an option pending current patient assistance) Post prandials higher--instructed patient to take insulin 27mn before eating-->must cover meals She is still taking after her meals Avg 2-3 meals/day--must be consistent  Denies personal and family history of Medullary thyroid cancer (MTC) RE-ENROLLMENT SENT ONLINE FOR Novo nordisk 2024(tresiba, novolog, ozempic) patient assistance Reports hyper/hypoglycemia (previously had) Discussed meal planning options and Plate method for healthy eating Avoid sugary drinks and desserts Incorporate balanced protein, non starchy veggies, 1 serving of carbohydrate with each meal Increase water intake Increase physical activity as able Discussed FOLLOWING A HEART HEALTHY DIET/HEALTHY PLATE METHOD Lipids currently controlled on rosuvastatin '40mg'$  Current exercise: n/a   Lab Results  Component Value Date   HGBA1C 9.4 (H) 09/20/2022    Lipid Panel     Component Value Date/Time   CHOL 124 09/20/2022 0835   CHOL 107 02/05/2013 1002   TRIG 90 09/20/2022 0835   TRIG 149 11/30/2015 0947   TRIG 143 02/05/2013 1002   HDL 43 09/20/2022 0835   HDL 35 (L) 11/30/2015 0947   HDL 35 (L) 02/05/2013 1002   CHOLHDL 2.9 09/20/2022  0835   CHOLHDL 2.9 03/27/2022 0537   VLDL 22 03/27/2022 0537   LDLCALC 64 09/20/2022 0835   LDLCALC 195 (H) 04/26/2014 1029   LDLCALC 43 02/05/2013 1002   LDLDIRECT 93 04/06/2015 1038    Home fasting blood sugars: 200s  2 hour post-meal/random blood sugars: 200+.    Clinical Atherosclerotic Cardiovascular Disease (ASCVD): No   The ASCVD Risk score (Arnett DK, et al., 2019) failed to calculate for the following reasons:   The valid total cholesterol range is 130 to 320 mg/dL   -Extensively discussed pathophysiology of diabetes, recommended lifestyle interventions, dietary effects on blood sugar control  -Counseled on s/sx of and management of hypoglycemia  -Next A1C anticipated march 2024.   Written patient instructions provided.  Total time in counseling 25 minutes.   Follow up Pharmacist Clinic Visit ON 10/11/22.    JRegina Eck PharmD, BCPS, BCACP Clinical Pharmacist, [place]  T [phone]

## 2022-10-04 NOTE — ED Triage Notes (Signed)
Patient brought in  by [facility] with complaints of chest pressure and coffee ground emesis for 2 days. Patient took 2 nitro prior to EMS arrival with no relief. EMS gave another nitro and morphine and pressure decreased from a 10/10 to 7/10 pain. Patient has hx of heart attacks with stent placement. VSS and CBG 203

## 2022-10-05 ENCOUNTER — Inpatient Hospital Stay (HOSPITAL_COMMUNITY)
Admission: EM | Disposition: A | Discharge status: Home / Self Care | Payer: Self-pay | Source: Home / Self Care | Attending: Family Medicine

## 2022-10-05 ENCOUNTER — Observation Stay (HOSPITAL_COMMUNITY): Payer: PPO

## 2022-10-05 DIAGNOSIS — R072 Precordial pain: Secondary | ICD-10-CM

## 2022-10-05 DIAGNOSIS — Z7982 Long term (current) use of aspirin: Secondary | ICD-10-CM | POA: Diagnosis not present

## 2022-10-05 DIAGNOSIS — Z833 Family history of diabetes mellitus: Secondary | ICD-10-CM | POA: Diagnosis not present

## 2022-10-05 DIAGNOSIS — Z794 Long term (current) use of insulin: Secondary | ICD-10-CM

## 2022-10-05 DIAGNOSIS — Z9049 Acquired absence of other specified parts of digestive tract: Secondary | ICD-10-CM | POA: Diagnosis not present

## 2022-10-05 DIAGNOSIS — Z8673 Personal history of transient ischemic attack (TIA), and cerebral infarction without residual deficits: Secondary | ICD-10-CM | POA: Diagnosis not present

## 2022-10-05 DIAGNOSIS — E785 Hyperlipidemia, unspecified: Secondary | ICD-10-CM | POA: Diagnosis present

## 2022-10-05 DIAGNOSIS — Z885 Allergy status to narcotic agent status: Secondary | ICD-10-CM | POA: Diagnosis not present

## 2022-10-05 DIAGNOSIS — Z90710 Acquired absence of both cervix and uterus: Secondary | ICD-10-CM | POA: Diagnosis not present

## 2022-10-05 DIAGNOSIS — Z9842 Cataract extraction status, left eye: Secondary | ICD-10-CM | POA: Diagnosis not present

## 2022-10-05 DIAGNOSIS — Z79899 Other long term (current) drug therapy: Secondary | ICD-10-CM | POA: Diagnosis not present

## 2022-10-05 DIAGNOSIS — R079 Chest pain, unspecified: Secondary | ICD-10-CM

## 2022-10-05 DIAGNOSIS — Z888 Allergy status to other drugs, medicaments and biological substances status: Secondary | ICD-10-CM | POA: Diagnosis not present

## 2022-10-05 DIAGNOSIS — I25110 Atherosclerotic heart disease of native coronary artery with unstable angina pectoris: Secondary | ICD-10-CM | POA: Diagnosis present

## 2022-10-05 DIAGNOSIS — E1151 Type 2 diabetes mellitus with diabetic peripheral angiopathy without gangrene: Secondary | ICD-10-CM | POA: Diagnosis present

## 2022-10-05 DIAGNOSIS — E1165 Type 2 diabetes mellitus with hyperglycemia: Secondary | ICD-10-CM

## 2022-10-05 DIAGNOSIS — G35 Multiple sclerosis: Secondary | ICD-10-CM | POA: Diagnosis present

## 2022-10-05 DIAGNOSIS — I110 Hypertensive heart disease with heart failure: Secondary | ICD-10-CM | POA: Diagnosis present

## 2022-10-05 DIAGNOSIS — I200 Unstable angina: Secondary | ICD-10-CM | POA: Diagnosis not present

## 2022-10-05 DIAGNOSIS — Y831 Surgical operation with implant of artificial internal device as the cause of abnormal reaction of the patient, or of later complication, without mention of misadventure at the time of the procedure: Secondary | ICD-10-CM | POA: Diagnosis present

## 2022-10-05 DIAGNOSIS — Z91041 Radiographic dye allergy status: Secondary | ICD-10-CM | POA: Diagnosis not present

## 2022-10-05 DIAGNOSIS — Z9841 Cataract extraction status, right eye: Secondary | ICD-10-CM | POA: Diagnosis not present

## 2022-10-05 DIAGNOSIS — F32A Depression, unspecified: Secondary | ICD-10-CM | POA: Diagnosis present

## 2022-10-05 DIAGNOSIS — I5032 Chronic diastolic (congestive) heart failure: Secondary | ICD-10-CM | POA: Diagnosis present

## 2022-10-05 DIAGNOSIS — T82855A Stenosis of coronary artery stent, initial encounter: Secondary | ICD-10-CM | POA: Diagnosis present

## 2022-10-05 DIAGNOSIS — Z8249 Family history of ischemic heart disease and other diseases of the circulatory system: Secondary | ICD-10-CM | POA: Diagnosis not present

## 2022-10-05 DIAGNOSIS — Z961 Presence of intraocular lens: Secondary | ICD-10-CM | POA: Diagnosis present

## 2022-10-05 DIAGNOSIS — K219 Gastro-esophageal reflux disease without esophagitis: Secondary | ICD-10-CM | POA: Diagnosis present

## 2022-10-05 HISTORY — PX: LEFT HEART CATH AND CORONARY ANGIOGRAPHY: CATH118249

## 2022-10-05 HISTORY — PX: CORONARY BALLOON ANGIOPLASTY: CATH118233

## 2022-10-05 LAB — I-STAT CHEM 8, ED
BUN: 15 mg/dL (ref 8–23)
Calcium, Ion: 1.15 mmol/L (ref 1.15–1.40)
Chloride: 100 mmol/L (ref 98–111)
Creatinine, Ser: 1 mg/dL (ref 0.44–1.00)
Glucose, Bld: 262 mg/dL — ABNORMAL HIGH (ref 70–99)
HCT: 33 % — ABNORMAL LOW (ref 36.0–46.0)
Hemoglobin: 11.2 g/dL — ABNORMAL LOW (ref 12.0–15.0)
Potassium: 3.7 mmol/L (ref 3.5–5.1)
Sodium: 138 mmol/L (ref 135–145)
TCO2: 27 mmol/L (ref 22–32)

## 2022-10-05 LAB — CBG MONITORING, ED
Glucose-Capillary: 249 mg/dL — ABNORMAL HIGH (ref 70–99)
Glucose-Capillary: 185 mg/dL — ABNORMAL HIGH (ref 70–99)
Glucose-Capillary: 204 mg/dL — ABNORMAL HIGH (ref 70–99)

## 2022-10-05 LAB — CBC
HCT: 32.4 % — ABNORMAL LOW (ref 36.0–46.0)
HCT: 33 % — ABNORMAL LOW (ref 36.0–46.0)
Hemoglobin: 10.7 g/dL — ABNORMAL LOW (ref 12.0–15.0)
Hemoglobin: 11.2 g/dL — ABNORMAL LOW (ref 12.0–15.0)
MCH: 29.1 pg (ref 26.0–34.0)
MCH: 29.9 pg (ref 26.0–34.0)
MCHC: 33 g/dL (ref 30.0–36.0)
MCHC: 33.9 g/dL (ref 30.0–36.0)
MCV: 88 fL (ref 80.0–100.0)
MCV: 88.2 fL (ref 80.0–100.0)
Platelets: 155 10*3/uL (ref 150–400)
Platelets: 157 10*3/uL (ref 150–400)
RBC: 3.68 MIL/uL — ABNORMAL LOW (ref 3.87–5.11)
RBC: 3.74 MIL/uL — ABNORMAL LOW (ref 3.87–5.11)
RDW: 14.5 % (ref 11.5–15.5)
RDW: 14.6 % (ref 11.5–15.5)
WBC: 6.4 10*3/uL (ref 4.0–10.5)
WBC: 6.9 10*3/uL (ref 4.0–10.5)
nRBC: 0 % (ref 0.0–0.2)
nRBC: 0 % (ref 0.0–0.2)

## 2022-10-05 LAB — GLUCOSE, CAPILLARY
Glucose-Capillary: 171 mg/dL — ABNORMAL HIGH (ref 70–99)
Glucose-Capillary: 293 mg/dL — ABNORMAL HIGH (ref 70–99)
Glucose-Capillary: 303 mg/dL — ABNORMAL HIGH (ref 70–99)

## 2022-10-05 LAB — ECHOCARDIOGRAM COMPLETE
AR max vel: 2.94 cm2
AV Area VTI: 2.71 cm2
AV Area mean vel: 2.8 cm2
AV Mean grad: 3 mmHg
AV Peak grad: 4.7 mmHg
Ao pk vel: 1.08 m/s
Area-P 1/2: 3.12 cm2
Calc EF: 62 %
Height: 66 in
MV M vel: 1.89 m/s
MV Peak grad: 14.3 mmHg
S' Lateral: 2.9 cm
Single Plane A2C EF: 63.4 %
Single Plane A4C EF: 56 %
Weight: 2560 oz

## 2022-10-05 LAB — BASIC METABOLIC PANEL
Anion gap: 11 (ref 5–15)
Anion gap: 9 (ref 5–15)
BUN: 12 mg/dL (ref 8–23)
BUN: 14 mg/dL (ref 8–23)
CO2: 26 mmol/L (ref 22–32)
CO2: 27 mmol/L (ref 22–32)
Calcium: 8.6 mg/dL — ABNORMAL LOW (ref 8.9–10.3)
Calcium: 8.9 mg/dL (ref 8.9–10.3)
Chloride: 102 mmol/L (ref 98–111)
Chloride: 98 mmol/L (ref 98–111)
Creatinine, Ser: 1 mg/dL (ref 0.44–1.00)
Creatinine, Ser: 1.22 mg/dL — ABNORMAL HIGH (ref 0.44–1.00)
GFR, Estimated: 47 mL/min — ABNORMAL LOW (ref >60)
GFR, Estimated: 59 mL/min — ABNORMAL LOW (ref >60)
Glucose, Bld: 240 mg/dL — ABNORMAL HIGH (ref 70–99)
Glucose, Bld: 266 mg/dL — ABNORMAL HIGH (ref 70–99)
Potassium: 3.4 mmol/L — ABNORMAL LOW (ref 3.5–5.1)
Potassium: 3.6 mmol/L (ref 3.5–5.1)
Sodium: 136 mmol/L (ref 135–145)
Sodium: 137 mmol/L (ref 135–145)

## 2022-10-05 LAB — TYPE AND SCREEN
ABO/RH(D): A NEG
Antibody Screen: NEGATIVE

## 2022-10-05 LAB — CREATININE, SERUM
Creatinine, Ser: 0.95 mg/dL (ref 0.44–1.00)
GFR, Estimated: >60 mL/min (ref >60)

## 2022-10-05 LAB — TROPONIN I (HIGH SENSITIVITY)
Troponin I (High Sensitivity): 5 ng/L (ref <18)
Troponin I (High Sensitivity): 6 ng/L (ref <18)

## 2022-10-05 LAB — POCT ACTIVATED CLOTTING TIME
Activated Clotting Time: 277 seconds
Activated Clotting Time: 315 seconds

## 2022-10-05 LAB — HEMOGLOBIN A1C
Hgb A1c MFr Bld: 8.5 % — ABNORMAL HIGH (ref 4.8–5.6)
Mean Plasma Glucose: 197.25 mg/dL

## 2022-10-05 SURGERY — LEFT HEART CATH AND CORONARY ANGIOGRAPHY
Anesthesia: LOCAL

## 2022-10-05 MED ORDER — DIPHENHYDRAMINE HCL 50 MG/ML IJ SOLN
INTRAMUSCULAR | Status: DC | PRN
  Administered 2022-10-05: 25 mg via INTRAVENOUS

## 2022-10-05 MED ORDER — SODIUM CHLORIDE 0.9% FLUSH: 3.0000 mL | INTRAVENOUS | Status: DC | PRN

## 2022-10-05 MED ORDER — MELATONIN 5 MG PO TABS: 5.0000 mg | ORAL_TABLET | Freq: Every evening | ORAL | Status: DC | PRN

## 2022-10-05 MED ORDER — HEPARIN SODIUM (PORCINE) 1000 UNIT/ML IJ SOLN
INTRAMUSCULAR | Status: DC | PRN
  Administered 2022-10-05: 4000 [IU] via INTRAVENOUS
  Administered 2022-10-05 (×2): 3000 [IU] via INTRAVENOUS

## 2022-10-05 MED ORDER — MIDAZOLAM HCL 2 MG/2ML IJ SOLN
INTRAMUSCULAR | Status: DC | PRN
  Administered 2022-10-05 (×2): 1 mg via INTRAVENOUS

## 2022-10-05 MED ORDER — NITROGLYCERIN 1 MG/10 ML FOR IR/CATH LAB
INTRA_ARTERIAL | Status: AC
  Filled 2022-10-05: qty 10

## 2022-10-05 MED ORDER — SODIUM CHLORIDE 0.9 % IV SOLN: 250.0000 mL | INTRAVENOUS | Status: DC | PRN

## 2022-10-05 MED ORDER — POLYETHYLENE GLYCOL 3350 17 G PO PACK: 17.0000 g | PACK | Freq: Every day | ORAL | Status: DC | PRN

## 2022-10-05 MED ORDER — LIDOCAINE HCL (PF) 1 % IJ SOLN
INTRAMUSCULAR | Status: DC | PRN
  Administered 2022-10-05: 5 mL via INTRADERMAL

## 2022-10-05 MED ORDER — HEPARIN (PORCINE) IN NACL 1000-0.9 UT/500ML-% IV SOLN
INTRAVENOUS | Status: AC
  Filled 2022-10-05: qty 1000

## 2022-10-05 MED ORDER — ACETAMINOPHEN 325 MG PO TABS
650.0000 mg | ORAL_TABLET | ORAL | Status: DC | PRN
  Administered 2022-10-05: 650 mg via ORAL
  Filled 2022-10-05: qty 2

## 2022-10-05 MED ORDER — FENTANYL CITRATE (PF) 100 MCG/2ML IJ SOLN
INTRAMUSCULAR | Status: DC | PRN
  Administered 2022-10-05 (×2): 25 ug via INTRAVENOUS

## 2022-10-05 MED ORDER — AMLODIPINE BESYLATE 10 MG PO TABS
10.0000 mg | ORAL_TABLET | Freq: Every day | ORAL | Status: DC
  Administered 2022-10-05 – 2022-10-06 (×2): 10 mg via ORAL
  Filled 2022-10-05 (×2): qty 1

## 2022-10-05 MED ORDER — ASPIRIN 81 MG PO CHEW
81.0000 mg | CHEWABLE_TABLET | Freq: Every day | ORAL | Status: DC
  Administered 2022-10-05 – 2022-10-06 (×2): 81 mg via ORAL
  Filled 2022-10-05 (×2): qty 1

## 2022-10-05 MED ORDER — FENTANYL CITRATE (PF) 100 MCG/2ML IJ SOLN
INTRAMUSCULAR | Status: AC
  Filled 2022-10-05: qty 2

## 2022-10-05 MED ORDER — SERTRALINE HCL 100 MG PO TABS
100.0000 mg | ORAL_TABLET | Freq: Every day | ORAL | Status: DC
  Administered 2022-10-05 – 2022-10-06 (×2): 100 mg via ORAL
  Filled 2022-10-05 (×2): qty 1

## 2022-10-05 MED ORDER — VERAPAMIL HCL 2.5 MG/ML IV SOLN
INTRAVENOUS | Status: AC
  Filled 2022-10-05: qty 2

## 2022-10-05 MED ORDER — TRAZODONE HCL 50 MG PO TABS: 100.0000 mg | ORAL_TABLET | Freq: Every evening | ORAL | Status: DC | PRN

## 2022-10-05 MED ORDER — CLOPIDOGREL BISULFATE 75 MG PO TABS
75.0000 mg | ORAL_TABLET | Freq: Every day | ORAL | Status: DC
  Administered 2022-10-06: 75 mg via ORAL
  Filled 2022-10-05: qty 1

## 2022-10-05 MED ORDER — SODIUM CHLORIDE 0.9% FLUSH: 3.0000 mL | Freq: Two times a day (BID) | INTRAVENOUS | Status: DC

## 2022-10-05 MED ORDER — PROCHLORPERAZINE EDISYLATE 10 MG/2ML IJ SOLN: 5.0000 mg | Freq: Four times a day (QID) | INTRAMUSCULAR | Status: DC | PRN

## 2022-10-05 MED ORDER — NITROGLYCERIN IN D5W 200-5 MCG/ML-% IV SOLN
0.0000 ug/min | INTRAVENOUS | Status: DC
  Administered 2022-10-05: 5 ug/min via INTRAVENOUS
  Administered 2022-10-05: 95 ug/min via INTRAVENOUS
  Administered 2022-10-05: 70 ug/min via INTRAVENOUS
  Administered 2022-10-05: 65 ug/min via INTRAVENOUS
  Administered 2022-10-06: 100 ug/min via INTRAVENOUS
  Filled 2022-10-05 (×3): qty 250

## 2022-10-05 MED ORDER — HYDRALAZINE HCL 20 MG/ML IJ SOLN: 10.0000 mg | INTRAMUSCULAR | Status: AC | PRN

## 2022-10-05 MED ORDER — ROSUVASTATIN CALCIUM 20 MG PO TABS
20.0000 mg | ORAL_TABLET | Freq: Every day | ORAL | Status: DC
  Administered 2022-10-05 – 2022-10-06 (×2): 20 mg via ORAL
  Filled 2022-10-05 (×2): qty 1

## 2022-10-05 MED ORDER — INSULIN ASPART 100 UNIT/ML IJ SOLN
0.0000 [IU] | INTRAMUSCULAR | Status: DC
  Administered 2022-10-05: 8 [IU] via SUBCUTANEOUS
  Administered 2022-10-05: 11 [IU] via SUBCUTANEOUS
  Administered 2022-10-05 (×2): 5 [IU] via SUBCUTANEOUS
  Administered 2022-10-05 (×2): 3 [IU] via SUBCUTANEOUS
  Administered 2022-10-06: 8 [IU] via SUBCUTANEOUS

## 2022-10-05 MED ORDER — LIDOCAINE HCL (PF) 1 % IJ SOLN
INTRAMUSCULAR | Status: AC
  Filled 2022-10-05: qty 30

## 2022-10-05 MED ORDER — ENOXAPARIN SODIUM 40 MG/0.4ML IJ SOSY
40.0000 mg | PREFILLED_SYRINGE | Freq: Every day | INTRAMUSCULAR | Status: DC
  Administered 2022-10-05 – 2022-10-06 (×2): 40 mg via SUBCUTANEOUS
  Filled 2022-10-05 (×2): qty 0.4

## 2022-10-05 MED ORDER — ATENOLOL 25 MG PO TABS
12.5000 mg | ORAL_TABLET | Freq: Two times a day (BID) | ORAL | Status: DC
  Administered 2022-10-05 – 2022-10-06 (×3): 12.5 mg via ORAL
  Filled 2022-10-05 (×2): qty 0.5
  Filled 2022-10-05: qty 1
  Filled 2022-10-05: qty 0.5

## 2022-10-05 MED ORDER — DIPHENHYDRAMINE HCL 50 MG/ML IJ SOLN: 25.0000 mg | Freq: Once | INTRAMUSCULAR | Status: DC

## 2022-10-05 MED ORDER — HEPARIN SODIUM (PORCINE) 1000 UNIT/ML IJ SOLN
INTRAMUSCULAR | Status: AC
  Filled 2022-10-05: qty 10

## 2022-10-05 MED ORDER — METHYLPREDNISOLONE SODIUM SUCC 125 MG IJ SOLR
125.0000 mg | Freq: Once | INTRAMUSCULAR | Status: AC
  Administered 2022-10-05: 125 mg via INTRAVENOUS
  Filled 2022-10-05: qty 2

## 2022-10-05 MED ORDER — ISOSORBIDE MONONITRATE ER 60 MG PO TB24
60.0000 mg | ORAL_TABLET | Freq: Every day | ORAL | Status: DC
  Administered 2022-10-05 – 2022-10-06 (×2): 60 mg via ORAL
  Filled 2022-10-05 (×2): qty 1

## 2022-10-05 MED ORDER — PERFLUTREN LIPID MICROSPHERE
1.0000 mL | INTRAVENOUS | Status: AC | PRN
  Administered 2022-10-05: 2 mL via INTRAVENOUS

## 2022-10-05 MED ORDER — ACETAMINOPHEN 325 MG PO TABS: 650.0000 mg | ORAL_TABLET | Freq: Four times a day (QID) | ORAL | Status: DC | PRN

## 2022-10-05 MED ORDER — LABETALOL HCL 5 MG/ML IV SOLN: 10.0000 mg | INTRAVENOUS | Status: AC | PRN

## 2022-10-05 MED ORDER — SODIUM CHLORIDE 0.9 % IV SOLN: INTRAVENOUS | Status: AC

## 2022-10-05 MED ORDER — SODIUM CHLORIDE 0.9 % IV SOLN: INTRAVENOUS | Status: DC

## 2022-10-05 MED ORDER — VERAPAMIL HCL 2.5 MG/ML IV SOLN
INTRAVENOUS | Status: DC | PRN
  Administered 2022-10-05: 10 mL via INTRA_ARTERIAL

## 2022-10-05 MED ORDER — MIDAZOLAM HCL 2 MG/2ML IJ SOLN
INTRAMUSCULAR | Status: AC
  Filled 2022-10-05: qty 2

## 2022-10-05 MED ORDER — PANTOPRAZOLE SODIUM 40 MG IV SOLR
40.0000 mg | Freq: Once | INTRAVENOUS | Status: AC
  Administered 2022-10-05: 40 mg via INTRAVENOUS
  Filled 2022-10-05: qty 10

## 2022-10-05 SURGICAL SUPPLY — 14 items
BALL SAPPHIRE NC24 2.50X15 (BALLOONS) ×1
BALLOON SAPPHIRE NC24 2.50X15 (BALLOONS) IMPLANT
CATH 5FR JL3.5 JR4 ANG PIG MP (CATHETERS) IMPLANT
CATH LAUNCHER 6FR EBU 3 (CATHETERS) IMPLANT
DEVICE RAD COMP TR BAND LRG (VASCULAR PRODUCTS) IMPLANT
GLIDESHEATH SLEND SS 6F .021 (SHEATH) IMPLANT
GUIDEWIRE INQWIRE 1.5J.035X260 (WIRE) IMPLANT
INQWIRE 1.5J .035X260CM (WIRE) ×1
KIT HEART LEFT (KITS) ×1 IMPLANT
PACK CARDIAC CATHETERIZATION (CUSTOM PROCEDURE TRAY) ×1 IMPLANT
SYR MEDRAD MARK 7 150ML (SYRINGE) ×1 IMPLANT
TRANSDUCER W/STOPCOCK (MISCELLANEOUS) ×1 IMPLANT
TUBING CIL FLEX 10 FLL-RA (TUBING) ×1 IMPLANT
WIRE COUGAR XT STRL 190CM (WIRE) IMPLANT

## 2022-10-05 NOTE — ED Notes (Signed)
GI at bedside

## 2022-10-05 NOTE — H&P (Signed)
History and Physical  [name] NID:[id number] DOB: [birth year] DOA: 10/04/2022  Referring physician: Dr. Tyrone Nine, [place] PCP: [name], [place]  Outpatient Specialists: Cardiology Patient coming from: Home through any pain.  Chief Complaint: Chest pain  HPI: [name] is a 72 y.o. female with medical history significant for coronary artery disease status post cardiac stenting x 3, essential hypertension, hyperlipidemia, type 2 diabetes, chronic anxiety/depression, who initially presented to [facility] due to centrally located chest pressure for the past 2 days.  Associated with dyspnea.  Similar to prior episode of chest pain that led to heart catheterization.  Was transferred from [facility] to [facility] for possible heart cath.  In the ED, describes 10 out of 10 chest pressure.  Nitro drip started.  EDP discussed the case with cardiology, will see in consultation.  At the time of this visit, her chest pressure has improved on nitro drip.  ED Course: Tmax 98.6.  BP 137/108, pulse 67, respiratory 30, O2 sat 100% on 2 L.  Lab studies remarkable for serum glucose 262.  Hemoglobin 10.7.  Review of Systems: Review of systems as noted in the HPI. All other systems reviewed and are negative.   Past Medical History:  Diagnosis Date   Anxiety    CAD (coronary artery disease)    DES to circumflex 02/2007, BMS to LAD and PTCA diagonal 03/2007   Carotid artery plaque    Mild   Cataract    Depression    Diverticulitis, colon    Elevated d-dimer 01/08/2014   Essential hypertension, benign    GERD (gastroesophageal reflux disease)    H/O hiatal hernia    HLD (hyperlipidemia)    IDDM (insulin dependent diabetes mellitus)    Migraine    "used to have them really bad; don't have them anymore" (01/07/2014)   MS (multiple sclerosis) (HCC)    Not confirmed   PAT (paroxysmal atrial tachycardia)    Prolapse of uterus    PVD (peripheral vascular disease) (HCC)    TIA (transient  ischemic attack) 1980's   Past Surgical History:  Procedure Laterality Date   ABDOMINAL HYSTERECTOMY  1986   ovaries remain - prolaspe uterus    APPENDECTOMY  ~ 1970   BREAST BIOPSY Right 1980's   BREAST LUMPECTOMY Right 1980's   Dr. Charlynne Pander    CARDIAC CATHETERIZATION  01/07/2014   CHOLECYSTECTOMY  ?1987   COLONOSCOPY  2002   Dr. Anwar--> Severe diverticular changes in the region of the sigmoid and descending colon with scattered diverticular changes throughout the rest of the colon. No polyps, ulcerations. Despite numerous manipulations, the tip of the scope could not be tipped into the cecal area.   COLONOSCOPY  01/10/2012   Procedure: COLONOSCOPY;  Surgeon: Daneil Dolin, MD;  [facility];  Service: Endoscopy;  Laterality: N/A;  1:55   CORONARY ANGIOPLASTY WITH STENT PLACEMENT  ~ 1997 X 2   "2 + 1"   CORONARY STENT INTERVENTION N/A 03/26/2022   Procedure: CORONARY STENT INTERVENTION;  Surgeon: Lorretta Harp, MD;  Location: [place] [facility] LAB;  Service: Cardiovascular;  Laterality: N/A;   EYE SURGERY Bilateral 2014   cataract   INTRAVASCULAR PRESSURE WIRE/FFR STUDY N/A 03/08/2017   Procedure: Intravascular Pressure Wire/FFR Study;  Surgeon: Nelva Bush, MD;  Location: [place] [facility] LAB;  Service: Cardiovascular;  Laterality: N/A;   LEFT HEART CATH AND CORONARY ANGIOGRAPHY N/A 03/08/2017   Procedure: Left Heart Cath and Coronary Angiography;  Surgeon: Nelva Bush, MD;  Location: [place] CV LAB;  Service: Cardiovascular;  Laterality: N/A;   LEFT HEART CATH AND CORONARY ANGIOGRAPHY N/A 03/26/2022   Procedure: LEFT HEART CATH AND CORONARY ANGIOGRAPHY;  Surgeon: Lorretta Harp, MD;  Location: [place] [facility] LAB;  Service: Cardiovascular;  Laterality: N/A;   LEFT HEART CATHETERIZATION WITH CORONARY ANGIOGRAM N/A 01/07/2014   Procedure: LEFT HEART CATHETERIZATION WITH CORONARY ANGIOGRAM;  Surgeon: Larey Dresser, MD;  Location: [facility] CATH LAB;  Service:  Cardiovascular;  Laterality: N/A;    Social History:  reports that she has never smoked. She has never used smokeless tobacco. She reports that she does not drink alcohol and does not use drugs.   Allergies  Allergen Reactions   Iohexol      Desc: pt had syncopal episode with nausea post IV CM late 1990's,  pt has had prednisone prep with heart caths x 2 without problem  kdean 04/16/07, Onset Date: [id number]    Ticlid [Ticlopidine Hcl] Nausea And Vomiting   Jardiance [Empagliflozin] Other (See Comments)    Recurrent UTIs   Metformin And Related Diarrhea   Codeine Nausea And Vomiting and Palpitations    Family History  Problem Relation Age of Onset   Heart attack Mother 15   Diabetes Mother    Hypertension Mother    Heart attack Father 20   Heart attack Brother 34       x 6   Heart disease Brother    Diabetes Brother    Colon cancer Paternal Aunt        30s, died with brain anuerysm   Crohn's disease Cousin        paternal   Diabetes Sister    GER disease Daughter    Cervical cancer Daughter    Diabetes Daughter       Prior to Admission medications   Medication Sig Start Date End Date Taking? Authorizing Provider  amLODipine (NORVASC) 10 MG tablet Take 1 tablet (10 mg total) by mouth daily. 03/27/22   Rai, Vernelle Emerald, MD  aspirin 81 MG chewable tablet Chew 1 tablet (81 mg total) by mouth daily. 03/28/22   Rai, Vernelle Emerald, MD  atenolol (TENORMIN) 50 MG tablet Take 1 tablet (50 mg total) by mouth 2 (two) times daily. 05/25/22   Karen Perking, FNP  clopidogrel (PLAVIX) 75 MG tablet Take 1 tablet (75 mg total) by mouth daily with breakfast. 03/28/22   Rai, Ripudeep K, MD  Continuous Blood Gluc Receiver (FREESTYLE LIBRE 2 READER) DEVI USE TO TEST BLOOD SUGAR 6 TIMES DAILY. Dx:E11.65 05/19/21   Karen Perking, FNP  Continuous Blood Gluc Sensor (FREESTYLE LIBRE 2 SENSOR) MISC USE TO TEST BLOOD SUGAR 6 TIMES DAILY 02/23/22   Karen Perking, FNP  furosemide (LASIX) 40 MG tablet Take  1 tablet (40 mg total) by mouth daily. 04/12/22   Karen Perking, FNP  insulin degludec (TRESIBA FLEXTOUCH) 100 UNIT/ML FlexTouch Pen Inject 60 Units into the skin daily.    [provider]  isosorbide mononitrate (IMDUR) 60 MG 24 hr tablet Take 1 tablet (60 mg total) by mouth daily. 03/27/22   Rai, Ripudeep K, MD  nitroGLYCERIN (NITROSTAT) 0.4 MG SL tablet DISSOLVE 1 TABLET UNDER TONGUE FOR CHESTPAIN.MAY REPEAT EVERY 5 MINUTES FOR 3 DOSES.IF NO RELIEF CALL 911 OR GO TO ER 03/27/22   Rai, Ripudeep K, MD  NOVOLOG FLEXPEN 100 UNIT/ML FlexPen INJECT 35-40 UNITS SQ 3 TIMES DAILY WITH MEALS Patient taking differently:  Inject 20-30 Units into the skin 3 (three) times daily with meals. 09/15/19   Dettinger, Fransisca Kaufmann, MD  potassium chloride (KLOR-CON M) 10 MEQ tablet Take 1 tablet (10 mEq total) by mouth 2 (two) times daily. 06/21/22   Karen Perking, FNP  rOPINIRole (REQUIP) 0.5 MG tablet Take 2 tablets (1 mg total) by mouth at bedtime. Patient taking differently: Take 1 mg by mouth daily as needed (restless leg). 01/09/21   Karen Perking, FNP  rosuvastatin (CRESTOR) 20 MG tablet TAKE ONE (1) TABLET BY MOUTH EVERY DAY 06/14/22   Karen Perking, FNP  Semaglutide, 1 MG/DOSE, (OZEMPIC, 1 MG/DOSE,) 2 MG/1.5ML SOPN Inject 1 mg into the skin once a week.    [provider]  sertraline (ZOLOFT) 100 MG tablet Take 1 tablet (100 mg total) by mouth daily. 09/20/22   Karen Perking, FNP  traZODone (DESYREL) 100 MG tablet Take 1 tablet (100 mg total) by mouth at bedtime as needed for sleep. 01/15/22   Karen Perking, FNP    Physical Exam: BP (!) 159/69   Pulse 71   Temp 98.1 F (36.7 C) (Oral)   Resp 15   Ht '5\' 6"'$  (1.676 m)   Wt 72.6 kg   SpO2 100%   BMI 25.82 kg/m   General: 72 y.o. year-old female well developed well nourished in no acute distress.  Alert and oriented x3. Cardiovascular: Regular rate and rhythm with no rubs or gallops.  No thyromegaly or JVD noted.  No lower  extremity edema. 2/4 pulses in all 4 extremities. Respiratory: Clear to auscultation with no wheezes or rales. Good inspiratory effort. Abdomen: Soft nontender nondistended with normal bowel sounds x4 quadrants. Muskuloskeletal: No cyanosis, clubbing or edema noted bilaterally Neuro: CN II-XII intact, strength, sensation, reflexes Skin: No ulcerative lesions noted or rashes Psychiatry: Judgement and insight appear normal. Mood is appropriate for condition and setting          Labs on Admission:  Basic Metabolic Panel: Recent Labs  Lab 10/04/22 2345 10/04/22 2357  NA 137 138  K 3.6 3.7  CL 102 100  CO2 26  --   GLUCOSE 266* 262*  BUN 14 15  CREATININE 1.22* 1.00  CALCIUM 8.6*  --    Liver Function Tests: No results for input(s): "AST", "ALT", "ALKPHOS", "BILITOT", "PROT", "ALBUMIN" in the last 168 hours. No results for input(s): "LIPASE", "AMYLASE" in the last 168 hours. No results for input(s): "AMMONIA" in the last 168 hours. CBC: Recent Labs  Lab 10/04/22 2345 10/04/22 2357  WBC 6.9  --   HGB 10.7* 11.2*  HCT 32.4* 33.0*  MCV 88.0  --   PLT 157  --    Cardiac Enzymes: No results for input(s): "CKTOTAL", "CKMB", "CKMBINDEX", "TROPONINI" in the last 168 hours.  BNP (last 3 results) No results for input(s): "BNP" in the last 8760 hours.  ProBNP (last 3 results) No results for input(s): "PROBNP" in the last 8760 hours.  CBG: Recent Labs  Lab 10/04/22 2358  GLUCAP 271*    Radiological Exams on Admission: DG Chest Portable 1 View  Result Date: 10/04/2022 CLINICAL DATA:  Chest pain. EXAM: PORTABLE CHEST 1 VIEW COMPARISON:  March 25, 2022 FINDINGS: The heart size and mediastinal contours are within normal limits. Coronary artery stents are in place. There is moderate severity calcification of the aortic arch. There is no evidence of acute infiltrate, pleural effusion or pneumothorax. The visualized skeletal structures are unremarkable. IMPRESSION: No active  cardiopulmonary  disease. Electronically Signed   By: Virgina Norfolk M.D.   On: 10/04/2022 23:43    EKG: I independently viewed the EKG done and my findings are as followed: Sinus rhythm rate of 77.  Nonspecific ST-T changes.  QTc 474.  Assessment/Plan Present on Admission:  Chest pain  Active Problems:   Chest pain  Chest pain, atypical, rule out ACS Endorses chest pressure similar to prior episode admitted to heart catheterization. First sets of high-sensitivity troponin negative 5, 6. No evidence of acute ischemia on 12 EKG. Obtain 2D echo Closely monitor on telemetry. Cardiology consulted by EDP.  Type 2 diabetes with hyperglycemia Obtain hemoglobin A1c Start insulin sliding scale every 4 hours while NPO.  Coronary artery disease status post PCI with stenting Resume home aspirin and atenolol Follow heart cath findings.  Essential hypertension Resume home atenolol Closely monitor vital signs  Chronic diastolic CHF Euvolemic on exam Last 2D echo done on 09/22/2017 revealed LVEF 60 to 65% and grade 1 diastolic dysfunction Start strict I's and O's and daily weight   DVT prophylaxis: Subcu Lovenox daily  Code Status: Full code  Family Communication: None at bedside  Disposition Plan: Admitted to progressive care unit  Consults called: Cardiology consulted by EDP.  Admission status: Observation status.   Status is: Observation    Kayleen Memos MD [facility] Pager [phone]  If 7PM-7AM, please contact night-coverage [email or web address] Password [facility]  10/05/2022, 1:38 AM

## 2022-10-05 NOTE — Consult Note (Addendum)
Consultation  Referring Provider: Dr. Darrick Meigs    Primary Care Physician:  Gwenlyn Perking, FNP Primary Gastroenterologist: Linna Hoff gastroenterology-unassigned here       Reason for Consultation: Anemia, dark stool         HPI:   [name] is a 72 y.o. female with past medical history of CAD status postcardiac stenting x 3, anxiety, diverticulitis, GERD, diabetes, multiple sclerosis, PVD and TIA on dual antiplatelet therapy, who presented to the hospital 10/04/2022 at [facility] due to centrally located chest pressure for the past 2 days with dyspnea similar to prior episodes of chest pain and left heart catheterization.  She was transferred to [facility] for possible heart cath.  We are consulted in regards to a new anemia and dark stool, hemoglobin 10.7--> 11.2 since admission.    Today, the patient explains that she was feeling okay until a couple of days ago when she developed a centrally located pressure in her chest with some shortness of breath that felt very similar to the chest pain she had prior to her left heart cath last year.  Also had an episode of nausea and vomiting about 2 nights ago, this vomit was "what I had just eaten".  Denies any coffee-ground's.  Does tell me that she was called by her PCP last week to start an iron supplement because she was "anemic", she has started doing this last week and was taking 1 oral iron a day and noticed that her stools turned black afterwards.  She was having a bowel movement once every 2 or 3 days which is her normal but it was dark in color.    Denies fever, chills, weight loss, bright red blood in her stool or melena.  ED course: BP 137/108, pulse 67, hemoglobin 10.7--> 11.2, BUN normal  GI history: 05/22/2001 colonoscopy with severe diverticulosis and otherwise normal  Past Medical History:  Diagnosis Date   Anxiety    CAD (coronary artery disease)    DES to circumflex 02/2007, BMS to LAD and PTCA diagonal 03/2007   Carotid  artery plaque    Mild   Cataract    Depression    Diverticulitis, colon    Elevated d-dimer 01/08/2014   Essential hypertension, benign    GERD (gastroesophageal reflux disease)    H/O hiatal hernia    HLD (hyperlipidemia)    IDDM (insulin dependent diabetes mellitus)    Migraine    "used to have them really bad; don't have them anymore" (01/07/2014)   MS (multiple sclerosis) ([place])    Not confirmed   PAT (paroxysmal atrial tachycardia)    Prolapse of uterus    PVD (peripheral vascular disease) (H[facility])    TIA (transient ischemic attack) 1980's    Past Surgical History:  Procedure Laterality Date   ABDOMINAL HYSTERECTOMY  1986   ovaries remain - prolaspe uterus    APPENDECTOMY  ~ 1970   BREAST BIOPSY Right 1980's   BREAST LUMPECTOMY Right 1980's   Dr. Charlynne Pander    CARDIAC CATHETERIZATION  01/07/2014   CHOLECYSTECTOMY  ?1987   COLONOSCOPY  2002   Dr. Anwar--> Severe diverticular changes in the region of the sigmoid and descending colon with scattered diverticular changes throughout the rest of the colon. No polyps, ulcerations. Despite numerous manipulations, the tip of the scope could not be tipped into the cecal area.   COLONOSCOPY  01/10/2012   Procedure: COLONOSCOPY;  Surgeon: Daneil Dolin, MD;  Location[facility];  Service: Endoscopy;  Laterality: N/A;  1:55   CORONARY ANGIOPLASTY WITH STENT PLACEMENT  ~ 1997 X 2   "2 + 1"   CORONARY STENT INTERVENTION N/A 03/26/2022   Procedure: CORONARY STENT INTERVENTION;  Surgeon: Lorretta Harp, MD;  Location: [place] [facility];  Service: Cardiovascular;  Laterality: N/A;   EYE SURGERY Bilateral 2014   cataract   INTRAVASCULAR PRESSURE WIRE/FFR STUDY N/A 03/08/2017   Procedure: Intravascular Pressure Wire/FFR Study;  Surgeon: Nelva Bush, MD;  Location: [place] [facility] LAB;  Service: Cardiovascular;  Laterality: N/A;   LEFT HEART CATH AND CORONARY ANGIOGRAPHY N/A 03/08/2017   Procedure: Left Heart Cath and Coronary Angiography;   Surgeon: Nelva Bush, MD;  Location: [place] [facility];  Service: Cardiovascular;  Laterality: N/A;   LEFT HEART CATH AND CORONARY ANGIOGRAPHY N/A 03/26/2022   Procedure: LEFT HEART CATH AND CORONARY ANGIOGRAPHY;  Surgeon: Lorretta Harp, MD;  Location: [place] [facility];  Service: Cardiovascular;  Laterality: N/A;   LEFT HEART CATHETERIZATION WITH CORONARY ANGIOGRAM N/A 01/07/2014   Procedure: LEFT HEART CATHETERIZATION WITH CORONARY ANGIOGRAM;  Surgeon: Larey Dresser, MD;  Location: [facility] CATH LAB;  Service: Cardiovascular;  Laterality: N/A;    Family History  Problem Relation Age of Onset   Heart attack Mother 99   Diabetes Mother    Hypertension Mother    Heart attack Father 37   Heart attack Brother 87       x 6   Heart disease Brother    Diabetes Brother    Colon cancer Paternal Aunt        45s, died with brain anuerysm   Crohn's disease Cousin        paternal   Diabetes Sister    GER disease Daughter    Cervical cancer Daughter    Diabetes Daughter      Social History   Tobacco Use   Smoking status: Never   Smokeless tobacco: Never   Tobacco comments:    spouse, 58 years - husband has quit 01/2011  Vaping Use   Vaping Use: Never used  Substance Use Topics   Alcohol use: No   Drug use: No    Prior to Admission medications   Medication Sig Start Date End Date Taking? Authorizing Provider  amLODipine (NORVASC) 10 MG tablet Take 1 tablet (10 mg total) by mouth daily. 03/27/22   Rai, Vernelle Emerald, MD  aspirin 81 MG chewable tablet Chew 1 tablet (81 mg total) by mouth daily. 03/28/22   Rai, Vernelle Emerald, MD  atenolol (TENORMIN) 50 MG tablet Take 1 tablet (50 mg total) by mouth 2 (two) times daily. 05/25/22   Gwenlyn Perking, FNP  clopidogrel (PLAVIX) 75 MG tablet Take 1 tablet (75 mg total) by mouth daily with breakfast. 03/28/22   Rai, Ripudeep K, MD  Continuous Blood Gluc Receiver (FREESTYLE LIBRE 2 READER) DEVI USE TO TEST BLOOD SUGAR 6 TIMES DAILY. Dx:E11.65 05/19/21    Gwenlyn Perking, FNP  Continuous Blood Gluc Sensor (FREESTYLE LIBRE 2 SENSOR) MISC USE TO TEST BLOOD SUGAR 6 TIMES DAILY 02/23/22   Gwenlyn Perking, FNP  furosemide (LASIX) 40 MG tablet Take 1 tablet (40 mg total) by mouth daily. 04/12/22   Gwenlyn Perking, FNP  insulin degludec (TRESIBA FLEXTOUCH) 100 UNIT/ML FlexTouch Pen Inject 60 Units into the skin daily.    [provider]  isosorbide mononitrate (IMDUR) 60 MG 24 hr tablet Take 1 tablet (60 mg total) by  mouth daily. 03/27/22   Rai, Ripudeep K, MD  nitroGLYCERIN (NITROSTAT) 0.4 MG SL tablet DISSOLVE 1 TABLET UNDER TONGUE FOR CHESTPAIN.MAY REPEAT EVERY 5 MINUTES FOR 3 DOSES.IF NO RELIEF CALL 911 OR GO TO ER 03/27/22   Rai, Ripudeep K, MD  NOVOLOG FLEXPEN 100 UNIT/ML FlexPen INJECT 35-40 UNITS SQ 3 TIMES DAILY WITH MEALS Patient taking differently: Inject 20-30 Units into the skin 3 (three) times daily with meals. 09/15/19   Dettinger, Fransisca Kaufmann, MD  potassium chloride (KLOR-CON M) 10 MEQ tablet Take 1 tablet (10 mEq total) by mouth 2 (two) times daily. 06/21/22   Gwenlyn Perking, FNP  rOPINIRole (REQUIP) 0.5 MG tablet Take 2 tablets (1 mg total) by mouth at bedtime. Patient taking differently: Take 1 mg by mouth daily as needed (restless leg). 01/09/21   Gwenlyn Perking, FNP  rosuvastatin (CRESTOR) 20 MG tablet TAKE ONE (1) TABLET BY MOUTH EVERY DAY 06/14/22   Gwenlyn Perking, FNP  Semaglutide, 1 MG/DOSE, (OZEMPIC, 1 MG/DOSE,) 2 MG/1.5ML SOPN Inject 1 mg into the skin once a week.    [provider]  sertraline (ZOLOFT) 100 MG tablet Take 1 tablet (100 mg total) by mouth daily. 09/20/22   Gwenlyn Perking, FNP  traZODone (DESYREL) 100 MG tablet Take 1 tablet (100 mg total) by mouth at bedtime as needed for sleep. 01/15/22   Gwenlyn Perking, FNP    Current Facility-Administered Medications  Medication Dose Route Frequency Provider Last Rate Last Admin   acetaminophen (TYLENOL) tablet 650 mg  650 mg Oral Q6H PRN Irene Pap  N, DO       aspirin chewable tablet 81 mg  81 mg Oral Daily Kayleen Memos, DO   81 mg at 10/05/22 8101   atenolol (TENORMIN) tablet 12.5 mg  12.5 mg Oral BID Irene Pap N, DO   12.5 mg at 10/05/22 0616   enoxaparin (LOVENOX) injection 40 mg  40 mg Subcutaneous Daily Irene Pap N, DO   40 mg at 10/05/22 7510   insulin aspart (novoLOG) injection 0-15 Units  0-15 Units Subcutaneous Q4H Irene Pap N, DO   3 Units at 10/05/22 2585   melatonin tablet 5 mg  5 mg Oral QHS PRN Kayleen Memos, DO       nitroGLYCERIN (NITROSTAT) SL tablet 0.4 mg  0.4 mg Sublingual Q5 Min x 3 PRN Deno Etienne, DO       nitroGLYCERIN 50 mg in dextrose 5 % 250 mL (0.2 mg/mL) infusion  0-200 mcg/min Intravenous Continuous Deno Etienne, DO 25.5 mL/hr at 10/05/22 1029 85 mcg/min at 10/05/22 1029   perflutren lipid microspheres (DEFINITY) IV suspension  1-10 mL Intravenous PRN Irene Pap N, DO   2 mL at 10/05/22 1041   polyethylene glycol (MIRALAX / GLYCOLAX) packet 17 g  17 g Oral Daily PRN Irene Pap N, DO       prochlorperazine (COMPAZINE) injection 5 mg  5 mg Intravenous Q6H PRN Kayleen Memos, DO       Current Outpatient Medications  Medication Sig Dispense Refill   amLODipine (NORVASC) 10 MG tablet Take 1 tablet (10 mg total) by mouth daily. 90 tablet 3   aspirin 81 MG chewable tablet Chew 1 tablet (81 mg total) by mouth daily. 90 tablet 3   atenolol (TENORMIN) 50 MG tablet Take 1 tablet (50 mg total) by mouth 2 (two) times daily. 180 tablet 3   clopidogrel (PLAVIX) 75 MG tablet Take 1 tablet (75 mg total) by  mouth daily with breakfast. 90 tablet 3   Continuous Blood Gluc Receiver (FREESTYLE LIBRE 2 READER) DEVI USE TO TEST BLOOD SUGAR 6 TIMES DAILY. Dx:E11.65 1 each 0   Continuous Blood Gluc Sensor (FREESTYLE LIBRE 2 SENSOR) MISC USE TO TEST BLOOD SUGAR 6 TIMES DAILY 2 each 11   furosemide (LASIX) 40 MG tablet Take 1 tablet (40 mg total) by mouth daily. 90 tablet 3   insulin degludec (TRESIBA FLEXTOUCH) 100 UNIT/ML  FlexTouch Pen Inject 60 Units into the skin daily.     isosorbide mononitrate (IMDUR) 60 MG 24 hr tablet Take 1 tablet (60 mg total) by mouth daily. 90 tablet 3   nitroGLYCERIN (NITROSTAT) 0.4 MG SL tablet DISSOLVE 1 TABLET UNDER TONGUE FOR CHESTPAIN.MAY REPEAT EVERY 5 MINUTES FOR 3 DOSES.IF NO RELIEF CALL 911 OR GO TO ER 25 tablet 3   NOVOLOG FLEXPEN 100 UNIT/ML FlexPen INJECT 35-40 UNITS SQ 3 TIMES DAILY WITH MEALS (Patient taking differently: Inject 20-30 Units into the skin 3 (three) times daily with meals.) 30 mL 3   potassium chloride (KLOR-CON M) 10 MEQ tablet Take 1 tablet (10 mEq total) by mouth 2 (two) times daily. 80 tablet 1   rOPINIRole (REQUIP) 0.5 MG tablet Take 2 tablets (1 mg total) by mouth at bedtime. (Patient taking differently: Take 1 mg by mouth daily as needed (restless leg).) 180 tablet 2   rosuvastatin (CRESTOR) 20 MG tablet TAKE ONE (1) TABLET BY MOUTH EVERY DAY 90 tablet 0   Semaglutide, 1 MG/DOSE, (OZEMPIC, 1 MG/DOSE,) 2 MG/1.5ML SOPN Inject 1 mg into the skin once a week.     sertraline (ZOLOFT) 100 MG tablet Take 1 tablet (100 mg total) by mouth daily. 90 tablet 3   traZODone (DESYREL) 100 MG tablet Take 1 tablet (100 mg total) by mouth at bedtime as needed for sleep. 90 tablet 1    Allergies as of 10/04/2022 - Review Complete 10/04/2022  Allergen Reaction Noted   Iohexol  04/16/2007   Ticlid [ticlopidine hcl] Nausea And Vomiting 02/02/2011   Jardiance [empagliflozin] Other (See Comments) 09/07/2021   Metformin and related Diarrhea 09/07/2021   Codeine Nausea And Vomiting and Palpitations 08/26/2008     Review of Systems:    Constitutional: No weight loss, fever or chills Skin: No rash Cardiovascular: +chest pain and pressure Respiratory:+DOE Gastrointestinal: See HPI and otherwise negative Genitourinary: No dysuria  Neurological: No headache, dizziness or syncope Musculoskeletal: No new muscle or joint pain Hematologic: No bleeding  Psychiatric: No  history of depression or anxiety    Physical Exam:  Vital signs in last 24 hours: Temp:  [97.6 F (36.4 C)-98.1 F (36.7 C)] 97.6 F (36.4 C) (01/12 0810) Pulse Rate:  [66-77] 70 (01/12 1045) Resp:  [11-24] 22 (01/12 1045) BP: (96-173)/(56-108) 140/61 (01/12 1045) SpO2:  [99 %-100 %] 100 % (01/12 1045) Weight:  [72.6 kg] 72.6 kg (01/11 2328)   General:   Pleasant Elderly Caucasian female appears to be in NAD, Well developed, Well nourished, alert and cooperative Head:  Normocephalic and atraumatic. Eyes:   PEERL, EOMI. No icterus. Conjunctiva pink. Ears:  Normal auditory acuity. Neck:  Supple Throat: Oral cavity and pharynx without inflammation, swelling or lesion.  Lungs: Respirations even and unlabored. Lungs clear to auscultation bilaterally.   No wheezes, crackles, or rhonchi.  Heart: Normal S1, S2. No MRG. Regular rate and rhythm. No peripheral edema, cyanosis or pallor.  Abdomen:  Soft, nondistended, nontender. No rebound or guarding. Normal bowel sounds. No appreciable masses  or hepatomegaly. Rectal:  Not performed.  Msk:  Symmetrical without gross deformities. Peripheral pulses intact.  Extremities:  Without edema, no deformity or joint abnormality. Neurologic:  Alert and  oriented x4;  grossly normal neurologically.  Skin:   Dry and intact without significant lesions or rashes. Psychiatric: Demonstrates good judgement and reason without abnormal affect or behaviors.  LAB RESULTS: Recent Labs    10/04/22 2345 10/04/22 2357 10/05/22 0500  WBC 6.9  --  6.4  HGB 10.7* 11.2* 11.2*  HCT 32.4* 33.0* 33.0*  PLT 157  --  155   BMET Recent Labs    10/04/22 2345 10/04/22 2357 10/05/22 0500  NA 137 138  --   K 3.6 3.7  --   CL 102 100  --   CO2 26  --   --   GLUCOSE 266* 262*  --   BUN 14 15  --   CREATININE 1.22* 1.00 0.95  CALCIUM 8.6*  --   --    STUDIES: DG Chest Portable 1 View  Result Date: 10/04/2022 CLINICAL DATA:  Chest pain. EXAM: PORTABLE CHEST 1  VIEW COMPARISON:  March 25, 2022 FINDINGS: The heart size and mediastinal contours are within normal limits. Coronary artery stents are in place. There is moderate severity calcification of the aortic arch. There is no evidence of acute infiltrate, pleural effusion or pneumothorax. The visualized skeletal structures are unremarkable. IMPRESSION: No active cardiopulmonary disease. Electronically Signed   By: Virgina Norfolk M.D.   On: 10/04/2022 23:43      Impression / Plan:   Impression: 1.  Anemia : Minimal anemia with a hemoglobin of 10.7--> increasing to 11.2 overnight, patient does describe some dark stools since starting her iron supplement last week, but no coffee-ground emesis or increased abdominal pain; consider anemia of chronic disease versus other 2.  Atypical chest pain: Currently being worked up by cardiology, similar pain prior to her stenting last year 3.  Type 2 diabetes 4.  CAD status post PCI with stenting 5.  Chronic diastolic CHF: 35/00/9381 echo with LVEF 82-99% grade 1 diastolic dysfunction  Plan: 1.  I do not think patient needs emergent EGD and colonoscopy during her hospital stay.  Her hemoglobin is very stable and she describes dark stools only after starting oral iron supplement which was likely the cause.  She should remain on her oral iron supplement as per her PCP and follow with them.  If remains low outpatient regardless of supplement may need EGD and colonoscopy in the future as an outpatient with her primary GI physicians at [facility].  Discussed this with the patient today.  She verbalized understanding. 2.  We will sign off.  Please call us back if something changes or you have further questions during this hospitalization, but from a GI standpoint no contraindication for anticoagulation at this point.  Thank you for your kind consultation.  Lavone Nian [facility]  10/05/2022, 10:53 AM   Attending physician's note   I have taken history, reviewed the chart  and examined the patient. I performed a substantive portion of this encounter, including complete performance of at least one of the key components, in conjunction with the APP. I agree with the Advanced Practitioner's note, impression and recommendations.   72 year old with CAD s/p DES on bASA/plavix with chest pains No GI bleed. Hb 11.2 (baseline).  Had dark stools after starting iron supplements. Neg CTA 03/2022 except for fatty liver/diverticulosis. H/O remote colonoscopy by Dr. Gala Romney. GERD  Plan: -Protonix  40 mg p.o. daily -She has assured me that she would FU with Dr. Gala Romney as outpatient for further GI workup.  Eventually, she would need EGD/colon. -Trend CBC -No need for any endoscopic procedures while inpatient unless active bleeding. -For now we will sign off.   Carmell Austria, MD [facility] [phone]

## 2022-10-05 NOTE — Interval H&P Note (Signed)
History and Physical Interval Note:  10/05/2022 4:32 PM  [name]  has presented today for surgery, with the diagnosis of unstable angina.  The various methods of treatment have been discussed with the patient and family. After consideration of risks, benefits and other options for treatment, the patient has consented to  Procedure(s): LEFT HEART CATH AND CORONARY ANGIOGRAPHY (N/A) as a surgical intervention.  The patient's history has been reviewed, patient examined, no change in status, stable for surgery.  I have reviewed the patient's chart and labs.  Questions were answered to the patient's satisfaction.     Sherren Mocha

## 2022-10-05 NOTE — Consult Note (Addendum)
Cardiology Consultation:   Patient ID: [name] MRN: [id number]; DOB: 1950-10-08  Admit date: 10/04/2022 Date of Consult: 10/05/2022  Primary Care Provider: Gwenlyn Perking, FNP [facility] Cardiologist: Minus Breeding, MD  [facility] Electrophysiologist:  None    Patient Profile:   [name] is a 72 y.o. female with a hx of HTN, TIA, PAD, and CAD (s/p PCI of LAD, diag, and LCx in 2008, DES to 90% Lcx and 95% restenosed mLAD lesion in July 2023) who is being seen today for the evaluation of chest pain at the request of ED.   History of Present Illness:   [name] reports 3 days of substernal chest pressure, non-radiating, 7-10/10 severity since onset, similar in quality to the pain that she experienced prior to her PCI in July 2023. Associated symptoms include DOE. Denies diaphoresis, palpitations, LH, or dizziness. No missed doses of DAPT. Compliant with anti-anginal therapies. She states that she has noted dark stools, however this in the setting of iron supplementation.   In ED, [name] is afebrile, hemodynamically stable with BP 130/50-60, HR 60-70. Labs notable for flat hsTnT (5-->6). EKG shows NSR, delayed R wave progression, no acute ST changes. IV nitro gtt initiated and Cardiology consulted for further evaluation of CP.   Past Medical History:  Diagnosis Date   Anxiety    CAD (coronary artery disease)    DES to circumflex 02/2007, BMS to LAD and PTCA diagonal 03/2007   Carotid artery plaque    Mild   Cataract    Depression    Diverticulitis, colon    Elevated d-dimer 01/08/2014   Essential hypertension, benign    GERD (gastroesophageal reflux disease)    H/O hiatal hernia    HLD (hyperlipidemia)    IDDM (insulin dependent diabetes mellitus)    Migraine    "used to have them really bad; don't have them anymore" (01/07/2014)   MS (multiple sclerosis) (HCC)    Not confirmed   PAT (paroxysmal atrial tachycardia)    Prolapse of uterus    PVD (peripheral  vascular disease) (HCC)    TIA (transient ischemic attack) 1980's    Past Surgical History:  Procedure Laterality Date   ABDOMINAL HYSTERECTOMY  1986   ovaries remain - prolaspe uterus    APPENDECTOMY  ~ 1970   BREAST BIOPSY Right 1980's   BREAST LUMPECTOMY Right 1980's   Dr. Charlynne Pander    CARDIAC CATHETERIZATION  01/07/2014   CHOLECYSTECTOMY  ?1987   COLONOSCOPY  2002   Dr. Anwar--> Severe diverticular changes in the region of the sigmoid and descending colon with scattered diverticular changes throughout the rest of the colon. No polyps, ulcerations. Despite numerous manipulations, the tip of the scope could not be tipped into the cecal area.   COLONOSCOPY  01/10/2012   Procedure: COLONOSCOPY;  Surgeon: Daneil Dolin, MD;  [facility];  Service: Endoscopy;  Laterality: N/A;  1:55   CORONARY ANGIOPLASTY WITH STENT PLACEMENT  ~ 1997 X 2   "2 + 1"   CORONARY STENT INTERVENTION N/A 03/26/2022   Procedure: CORONARY STENT INTERVENTION;  Surgeon: Lorretta Harp, MD;  Location: [place] [facility];  Service: Cardiovascular;  Laterality: N/A;   EYE SURGERY Bilateral 2014   cataract   INTRAVASCULAR PRESSURE WIRE/FFR STUDY N/A 03/08/2017   Procedure: Intravascular Pressure Wire/FFR Study;  Surgeon: Nelva Bush, MD;  Location: [place] [facility];  Service: Cardiovascular;  Laterality: N/A;   LEFT HEART CATH AND CORONARY  ANGIOGRAPHY N/A 03/08/2017   Procedure: Left Heart Cath and Coronary Angiography;  Surgeon: Nelva Bush, MD;  Location: [place] [facility];  Service: Cardiovascular;  Laterality: N/A;   LEFT HEART CATH AND CORONARY ANGIOGRAPHY N/A 03/26/2022   Procedure: LEFT HEART CATH AND CORONARY ANGIOGRAPHY;  Surgeon: Lorretta Harp, MD;  Location: [place] [facility];  Service: Cardiovascular;  Laterality: N/A;   LEFT HEART CATHETERIZATION WITH CORONARY ANGIOGRAM N/A 01/07/2014   Procedure: LEFT HEART CATHETERIZATION WITH CORONARY ANGIOGRAM;  Surgeon: Larey Dresser, MD;   Location: [facility] CATH LAB;  Service: Cardiovascular;  Laterality: N/A;     Home Medications:  Prior to Admission medications   Medication Sig Start Date End Date Taking? Authorizing Provider  amLODipine (NORVASC) 10 MG tablet Take 1 tablet (10 mg total) by mouth daily. 03/27/22   Rai, Vernelle Emerald, MD  aspirin 81 MG chewable tablet Chew 1 tablet (81 mg total) by mouth daily. 03/28/22   Rai, Vernelle Emerald, MD  atenolol (TENORMIN) 50 MG tablet Take 1 tablet (50 mg total) by mouth 2 (two) times daily. 05/25/22   Gwenlyn Perking, FNP  clopidogrel (PLAVIX) 75 MG tablet Take 1 tablet (75 mg total) by mouth daily with breakfast. 03/28/22   Rai, Ripudeep K, MD  Continuous Blood Gluc Receiver (FREESTYLE LIBRE 2 READER) DEVI USE TO TEST BLOOD SUGAR 6 TIMES DAILY. Dx:E11.65 05/19/21   Gwenlyn Perking, FNP  Continuous Blood Gluc Sensor (FREESTYLE LIBRE 2 SENSOR) MISC USE TO TEST BLOOD SUGAR 6 TIMES DAILY 02/23/22   Gwenlyn Perking, FNP  furosemide (LASIX) 40 MG tablet Take 1 tablet (40 mg total) by mouth daily. 04/12/22   Gwenlyn Perking, FNP  insulin degludec (TRESIBA FLEXTOUCH) 100 UNIT/ML FlexTouch Pen Inject 60 Units into the skin daily.    [provider]  isosorbide mononitrate (IMDUR) 60 MG 24 hr tablet Take 1 tablet (60 mg total) by mouth daily. 03/27/22   Rai, Ripudeep K, MD  nitroGLYCERIN (NITROSTAT) 0.4 MG SL tablet DISSOLVE 1 TABLET UNDER TONGUE FOR CHESTPAIN.MAY REPEAT EVERY 5 MINUTES FOR 3 DOSES.IF NO RELIEF CALL 911 OR GO TO ER 03/27/22   Rai, Ripudeep K, MD  NOVOLOG FLEXPEN 100 UNIT/ML FlexPen INJECT 35-40 UNITS SQ 3 TIMES DAILY WITH MEALS Patient taking differently: Inject 20-30 Units into the skin 3 (three) times daily with meals. 09/15/19   Dettinger, Fransisca Kaufmann, MD  potassium chloride (KLOR-CON M) 10 MEQ tablet Take 1 tablet (10 mEq total) by mouth 2 (two) times daily. 06/21/22   Gwenlyn Perking, FNP  rOPINIRole (REQUIP) 0.5 MG tablet Take 2 tablets (1 mg total) by mouth at bedtime. Patient taking  differently: Take 1 mg by mouth daily as needed (restless leg). 01/09/21   Gwenlyn Perking, FNP  rosuvastatin (CRESTOR) 20 MG tablet TAKE ONE (1) TABLET BY MOUTH EVERY DAY 06/14/22   Gwenlyn Perking, FNP  Semaglutide, 1 MG/DOSE, (OZEMPIC, 1 MG/DOSE,) 2 MG/1.5ML SOPN Inject 1 mg into the skin once a week.    [provider]  sertraline (ZOLOFT) 100 MG tablet Take 1 tablet (100 mg total) by mouth daily. 09/20/22   Gwenlyn Perking, FNP  traZODone (DESYREL) 100 MG tablet Take 1 tablet (100 mg total) by mouth at bedtime as needed for sleep. 01/15/22   Gwenlyn Perking, FNP    Inpatient Medications: Scheduled Meds:  aspirin  81 mg Oral Daily   atenolol  12.5 mg Oral BID   enoxaparin (LOVENOX) injection  40 mg  Subcutaneous Daily   insulin aspart  0-15 Units Subcutaneous Q4H   Continuous Infusions:  nitroGLYCERIN 60 mcg/min (10/05/22 0226)   PRN Meds: acetaminophen, melatonin, nitroGLYCERIN, polyethylene glycol, prochlorperazine  Allergies:    Allergies  Allergen Reactions   Iohexol      Desc: pt had syncopal episode with nausea post IV CM late 1990's,  pt has had prednisone prep with heart caths x 2 without problem  kdean 04/16/07, Onset Date: [id number]    Ticlid [Ticlopidine Hcl] Nausea And Vomiting   Jardiance [Empagliflozin] Other (See Comments)    Recurrent UTIs   Metformin And Related Diarrhea   Codeine Nausea And Vomiting and Palpitations    Social History:   Social History   Socioeconomic History   Marital status: Widowed    Spouse name: Not on file   Number of children: 4   Years of education: 33   Highest education level: 11th grade  Occupational History   Occupation: Disability    Employer: DISABLED  Tobacco Use   Smoking status: Never   Smokeless tobacco: Never   Tobacco comments:    spouse, 59 years - husband has quit 01/2011  Vaping Use   Vaping Use: Never used  Substance and Sexual Activity   Alcohol use: No   Drug use: No   Sexual activity:  Not Currently  Other Topics Concern   Not on file  Social History Narrative   Lives alone, one level, handicap accessible bathroom, her children all live nearby   Social Determinants of Health   Financial Resource Strain: Low Risk  (01/22/2022)   Overall Financial Resource Strain (CARDIA)    Difficulty of Paying Living Expenses: Not very hard  Food Insecurity: No Food Insecurity (01/22/2022)   Hunger Vital Sign    Worried About Running Out of Food in the Last Year: Never true    [place] in the Last Year: Never true  Transportation Needs: No Transportation Needs (01/22/2022)   PRAPARE - Hydrologist (Medical): No    Lack of Transportation (Non-Medical): No  Physical Activity: Insufficiently Active (01/22/2022)   Exercise Vital Sign    Days of Exercise per Week: 7 days    Minutes of Exercise per Session: 20 min  Stress: No Stress Concern Present (01/22/2022)   [place]    Feeling of Stress : Only a little  Social Connections: Moderately Integrated (01/22/2022)   Social Connection and Isolation Panel [NHANES]    Frequency of Communication with Friends and Family: More than three times a week    Frequency of Social Gatherings with Friends and Family: More than three times a week    Attends Religious Services: More than 4 times per year    Active Member of [employer] or Organizations: Yes    Attends Archivist Meetings: More than 4 times per year    Marital Status: Widowed  Intimate Partner Violence: Not At Risk (01/22/2022)   Humiliation, Afraid, Rape, and Kick questionnaire    Fear of Current or Ex-Partner: No    Emotionally Abused: No    Physically Abused: No    Sexually Abused: No    Family History:   Family History  Problem Relation Age of Onset   Heart attack Mother 50   Diabetes Mother    Hypertension Mother    Heart attack Father 83   Heart attack Brother 63       x 6  Heart disease Brother    Diabetes Brother    Colon cancer Paternal Aunt        51s, died with brain anuerysm   Crohn's disease Cousin        paternal   Diabetes Sister    GER disease Daughter    Cervical cancer Daughter    Diabetes Daughter      Physical Exam/Data:   Vitals:   10/05/22 0415 10/05/22 0433 10/05/22 0500 10/05/22 0600  BP: (!) 149/75  (!) 137/108 (!) 131/56  Pulse: 67  67 70  Resp: '17  13 15  '$ Temp:  97.9 F (36.6 C)    TempSrc:  Oral    SpO2: 100%  100% 99%  Weight:      Height:        Intake/Output Summary (Last 24 hours) at 10/05/2022 0651 Last data filed at 10/05/2022 0227 Gross per 24 hour  Intake 5.61 ml  Output --  Net 5.61 ml      10/04/2022   11:28 PM 09/20/2022    8:21 AM 06/20/2022    7:54 AM  Last 3 Weights  Weight (lbs) 160 lb 163 lb 155 lb 8 oz  Weight (kg) 72.576 kg 73.936 kg 70.534 kg     Body mass index is 25.82 kg/m.  General:  Well nourished, well developed, NAD HEENT: normal Lymph: no adenopathy Neck: no JVD Endocrine:  No thryomegaly Vascular: No carotid bruits; FA pulses 2+ bilaterally without bruits  Cardiac:  normal S1, S2; RRR. Mild tenderness to palpation along L sternal border.  Lungs:  clear to auscultation bilaterally, no wheezing, rhonchi or rales  Abd: soft, nontender, no hepatomegaly  Ext: no edema Musculoskeletal:  No deformities, BUE and BLE strength normal and equal Skin: warm and dry  Neuro:  CNs 2-12 intact, no focal abnormalities noted Psych:  Normal affect   EKG:  The EKG was personally reviewed and demonstrates:  NSR, delayed R wave progression, no ST changes.   Relevant CV Studies: Coronary Angiography 03/2022   Mid Cx lesion is 90% stenosed.   Prox LAD to Mid LAD lesion is 95% stenosed.   Previously placed Prox Cx to Mid Cx stent of unknown type is  widely patent.   Previously placed Mid LAD stent of unknown type is  widely patent.   A drug-eluting stent was successfully placed.   A drug-eluting  stent was successfully placed.   Post intervention, there is a 0% residual stenosis.   Post intervention, there is a 0% residual stenosis.   Post intervention, there is a 0% residual stenosis.  Laboratory Data:  High Sensitivity Troponin:   Recent Labs  Lab 10/04/22 2345 10/05/22 0130  TROPONINIHS 5 6     Chemistry Recent Labs  Lab 10/04/22 2345 10/04/22 2357 10/05/22 0500  NA 137 138  --   K 3.6 3.7  --   CL 102 100  --   CO2 26  --   --   GLUCOSE 266* 262*  --   BUN 14 15  --   CREATININE 1.22* 1.00 0.95  CALCIUM 8.6*  --   --   GFRNONAA 47*  --  >60  ANIONGAP 9  --   --     No results for input(s): "PROT", "ALBUMIN", "AST", "ALT", "ALKPHOS", "BILITOT" in the last 168 hours. Hematology Recent Labs  Lab 10/04/22 2345 10/04/22 2357 10/05/22 0500  WBC 6.9  --  6.4  RBC 3.68*  --  3.74*  HGB 10.7*  11.2* 11.2*  HCT 32.4* 33.0* 33.0*  MCV 88.0  --  88.2  MCH 29.1  --  29.9  MCHC 33.0  --  33.9  RDW 14.6  --  14.5  PLT 157  --  155   BNPNo results for input(s): "BNP", "PROBNP" in the last 168 hours.  DDimer No results for input(s): "DDIMER" in the last 168 hours.  Radiology/Studies:  DG Chest Portable 1 View  Result Date: 10/04/2022 CLINICAL DATA:  Chest pain. EXAM: PORTABLE CHEST 1 VIEW COMPARISON:  March 25, 2022 FINDINGS: The heart size and mediastinal contours are within normal limits. Coronary artery stents are in place. There is moderate severity calcification of the aortic arch. There is no evidence of acute infiltrate, pleural effusion or pneumothorax. The visualized skeletal structures are unremarkable. IMPRESSION: No active cardiopulmonary disease. Electronically Signed   By: Virgina Norfolk M.D.   On: 10/04/2022 23:43      TIMI Risk Score for Unstable Angina or Non-ST Elevation MI:   The patient's TIMI risk score is 5, which indicates a 26% risk of all cause mortality, new or recurrent myocardial infarction or need for urgent revascularization in the  next 14 days.   Assessment and Plan:   #Chest Pain Ongoing for several days. Quality is similar to pain experienced prior to [facility] and LCx in April 2023. No missed DAPT. Negative hsTnT x2, non-ischemic EKG. Nitro drip initiated in ED. Given that Ms. Mcentee's pain is not entirely resolved despite continued escalation of anti-anginal therapies, known CAD with multiple prior interventions, and consistency of her pain with prior presentation (also with negative hsTnT at that time), would favor repeat coronary angiography.  - Continue DAPT (ASA '81mg'$  QD, Plavix '75mg'$  QD) - Continue home beta blockade: atenolol '50mg'$  BID - Continue amlodipine '10mg'$  QD - Continue rosuva '20mg'$  QD - Increase imdur to '60mg'$  BID - Consider addition of ranolazine '500mg'$  BID - Continue IV NTG infusion initiated in ED - Recommend repeat coronary angiography given persistent substernal chest pain, consistent with prior presentation, despite optimal medical therapies.   Cardiology team to see in AM with final recommendations to follow at that time.       For questions or updates, please contact [place] Please consult [email or web address] for contact info under    Signed, Delorse Limber, MD  10/05/2022 6:51 AM Patient seen and admitted.  She presents with 2 days of chest pressure similar to her symptoms prior to her previous PCI.  The pain has been continuous.  It is not pleuritic or positional and not related to food.  She has dyspnea on exertion which is chronic.  Her electrocardiogram shows no acute ST changes.  Troponins are normal.  Will plan to proceed with cardiac catheterization for definitive evaluation.  The risk and benefits including myocardial infarction, CVA and death discussed and she agrees to proceed.  Note she has a dye allergy we will premedicate with Solu-Medrol and Benadryl.  Continue preadmission medications. Kirk Ruths, MD

## 2022-10-05 NOTE — ED Notes (Signed)
Echo at bedside

## 2022-10-05 NOTE — H&P (View-Only) (Signed)
Cardiology Consultation:   Patient ID: [name] MRN: [id number]; DOB: 1950/10/27  Admit date: 10/04/2022 Date of Consult: 10/05/2022  Primary Care Provider: Gwenlyn Perking, FNP [facility] Cardiologist: Karen Breeding, MD  [facility] Electrophysiologist:  None    Patient Profile:   [name] is a 72 y.o. female with a hx of HTN, TIA, PAD, and CAD (s/p PCI of LAD, diag, and LCx in 2008, DES to 90% Lcx and 95% restenosed mLAD lesion in July 2023) who is being seen today for the evaluation of chest pain at the request of ED.   History of Present Illness:   [name] reports 3 days of substernal chest pressure, non-radiating, 7-10/10 severity since onset, similar in quality to the pain that she experienced prior to her PCI in July 2023. Associated symptoms include DOE. Denies diaphoresis, palpitations, LH, or dizziness. No missed doses of DAPT. Compliant with anti-anginal therapies. She states that she has noted dark stools, however this in the setting of iron supplementation.   In ED, [name] is afebrile, hemodynamically stable with BP 130/50-60, HR 60-70. Labs notable for flat hsTnT (5-->6). EKG shows NSR, delayed R wave progression, no acute ST changes. IV nitro gtt initiated and Cardiology consulted for further evaluation of CP.   Past Medical History:  Diagnosis Date   Anxiety    CAD (coronary artery disease)    DES to circumflex 02/2007, BMS to LAD and PTCA diagonal 03/2007   Carotid artery plaque    Mild   Cataract    Depression    Diverticulitis, colon    Elevated d-dimer 01/08/2014   Essential hypertension, benign    GERD (gastroesophageal reflux disease)    H/O hiatal hernia    HLD (hyperlipidemia)    IDDM (insulin dependent diabetes mellitus)    Migraine    "used to have them really bad; don't have them anymore" (01/07/2014)   MS (multiple sclerosis) (HCC)    Not confirmed   PAT (paroxysmal atrial tachycardia)    Prolapse of uterus    PVD (peripheral  vascular disease) (HCC)    TIA (transient ischemic attack) 1980's    Past Surgical History:  Procedure Laterality Date   ABDOMINAL HYSTERECTOMY  1986   ovaries remain - prolaspe uterus    APPENDECTOMY  ~ 1970   BREAST BIOPSY Right 1980's   BREAST LUMPECTOMY Right 1980's   Dr. Charlynne Pander    CARDIAC CATHETERIZATION  01/07/2014   CHOLECYSTECTOMY  ?1987   COLONOSCOPY  2002   Dr. Anwar--> Severe diverticular changes in the region of the sigmoid and descending colon with scattered diverticular changes throughout the rest of the colon. No polyps, ulcerations. Despite numerous manipulations, the tip of the scope could not be tipped into the cecal area.   COLONOSCOPY  01/10/2012   Procedure: COLONOSCOPY;  Surgeon: Daneil Dolin, MD;  [facility];  Service: Endoscopy;  Laterality: N/A;  1:55   CORONARY ANGIOPLASTY WITH STENT PLACEMENT  ~ 1997 X 2   "2 + 1"   CORONARY STENT INTERVENTION N/A 03/26/2022   Procedure: CORONARY STENT INTERVENTION;  Surgeon: Lorretta Harp, MD;  Location: [place] [facility];  Service: Cardiovascular;  Laterality: N/A;   EYE SURGERY Bilateral 2014   cataract   INTRAVASCULAR PRESSURE WIRE/FFR STUDY N/A 03/08/2017   Procedure: Intravascular Pressure Wire/FFR Study;  Surgeon: Nelva Bush, MD;  Location: [place] [facility];  Service: Cardiovascular;  Laterality: N/A;   LEFT HEART CATH AND CORONARY  ANGIOGRAPHY N/A 03/08/2017   Procedure: Left Heart Cath and Coronary Angiography;  Surgeon: Nelva Bush, MD;  Location: [place] [facility];  Service: Cardiovascular;  Laterality: N/A;   LEFT HEART CATH AND CORONARY ANGIOGRAPHY N/A 03/26/2022   Procedure: LEFT HEART CATH AND CORONARY ANGIOGRAPHY;  Surgeon: Lorretta Harp, MD;  Location: [place] [facility];  Service: Cardiovascular;  Laterality: N/A;   LEFT HEART CATHETERIZATION WITH CORONARY ANGIOGRAM N/A 01/07/2014   Procedure: LEFT HEART CATHETERIZATION WITH CORONARY ANGIOGRAM;  Surgeon: Larey Dresser, MD;   Location: [facility] CATH LAB;  Service: Cardiovascular;  Laterality: N/A;     Home Medications:  Prior to Admission medications   Medication Sig Start Date End Date Taking? Authorizing Provider  amLODipine (NORVASC) 10 MG tablet Take 1 tablet (10 mg total) by mouth daily. 03/27/22   Rai, Vernelle Emerald, MD  aspirin 81 MG chewable tablet Chew 1 tablet (81 mg total) by mouth daily. 03/28/22   Rai, Vernelle Emerald, MD  atenolol (TENORMIN) 50 MG tablet Take 1 tablet (50 mg total) by mouth 2 (two) times daily. 05/25/22   Karen Perking, FNP  clopidogrel (PLAVIX) 75 MG tablet Take 1 tablet (75 mg total) by mouth daily with breakfast. 03/28/22   Rai, Ripudeep K, MD  Continuous Blood Gluc Receiver (FREESTYLE LIBRE 2 READER) DEVI USE TO TEST BLOOD SUGAR 6 TIMES DAILY. Dx:E11.65 05/19/21   Karen Perking, FNP  Continuous Blood Gluc Sensor (FREESTYLE LIBRE 2 SENSOR) MISC USE TO TEST BLOOD SUGAR 6 TIMES DAILY 02/23/22   Karen Perking, FNP  furosemide (LASIX) 40 MG tablet Take 1 tablet (40 mg total) by mouth daily. 04/12/22   Karen Perking, FNP  insulin degludec (TRESIBA FLEXTOUCH) 100 UNIT/ML FlexTouch Pen Inject 60 Units into the skin daily.    [provider]  isosorbide mononitrate (IMDUR) 60 MG 24 hr tablet Take 1 tablet (60 mg total) by mouth daily. 03/27/22   Rai, Ripudeep K, MD  nitroGLYCERIN (NITROSTAT) 0.4 MG SL tablet DISSOLVE 1 TABLET UNDER TONGUE FOR CHESTPAIN.MAY REPEAT EVERY 5 MINUTES FOR 3 DOSES.IF NO RELIEF CALL 911 OR GO TO ER 03/27/22   Rai, Ripudeep K, MD  NOVOLOG FLEXPEN 100 UNIT/ML FlexPen INJECT 35-40 UNITS SQ 3 TIMES DAILY WITH MEALS Patient taking differently: Inject 20-30 Units into the skin 3 (three) times daily with meals. 09/15/19   Dettinger, Fransisca Kaufmann, MD  potassium chloride (KLOR-CON M) 10 MEQ tablet Take 1 tablet (10 mEq total) by mouth 2 (two) times daily. 06/21/22   Karen Perking, FNP  rOPINIRole (REQUIP) 0.5 MG tablet Take 2 tablets (1 mg total) by mouth at bedtime. Patient taking  differently: Take 1 mg by mouth daily as needed (restless leg). 01/09/21   Karen Perking, FNP  rosuvastatin (CRESTOR) 20 MG tablet TAKE ONE (1) TABLET BY MOUTH EVERY DAY 06/14/22   Karen Perking, FNP  Semaglutide, 1 MG/DOSE, (OZEMPIC, 1 MG/DOSE,) 2 MG/1.5ML SOPN Inject 1 mg into the skin once a week.    [provider]  sertraline (ZOLOFT) 100 MG tablet Take 1 tablet (100 mg total) by mouth daily. 09/20/22   Karen Perking, FNP  traZODone (DESYREL) 100 MG tablet Take 1 tablet (100 mg total) by mouth at bedtime as needed for sleep. 01/15/22   Karen Perking, FNP    Inpatient Medications: Scheduled Meds:  aspirin  81 mg Oral Daily   atenolol  12.5 mg Oral BID   enoxaparin (LOVENOX) injection  40 mg  Subcutaneous Daily   insulin aspart  0-15 Units Subcutaneous Q4H   Continuous Infusions:  nitroGLYCERIN 60 mcg/min (10/05/22 0226)   PRN Meds: acetaminophen, melatonin, nitroGLYCERIN, polyethylene glycol, prochlorperazine  Allergies:    Allergies  Allergen Reactions   Iohexol      Desc: pt had syncopal episode with nausea post IV CM late 1990's,  pt has had prednisone prep with heart caths x 2 without problem  kdean 04/16/07, Onset Date: [id number]    Ticlid [Ticlopidine Hcl] Nausea And Vomiting   Jardiance [Empagliflozin] Other (See Comments)    Recurrent UTIs   Metformin And Related Diarrhea   Codeine Nausea And Vomiting and Palpitations    Social History:   Social History   Socioeconomic History   Marital status: Widowed    Spouse name: Not on file   Number of children: 4   Years of education: 8   Highest education level: 11th grade  Occupational History   Occupation: Disability    Employer: DISABLED  Tobacco Use   Smoking status: Never   Smokeless tobacco: Never   Tobacco comments:    spouse, 11 years - husband has quit 01/2011  Vaping Use   Vaping Use: Never used  Substance and Sexual Activity   Alcohol use: No   Drug use: No   Sexual activity:  Not Currently  Other Topics Concern   Not on file  Social History Narrative   Lives alone, one level, handicap accessible bathroom, her children all live nearby   Social Determinants of Health   Financial Resource Strain: Low Risk  (01/22/2022)   Overall Financial Resource Strain (CARDIA)    Difficulty of Paying Living Expenses: Not very hard  Food Insecurity: No Food Insecurity (01/22/2022)   Hunger Vital Sign    Worried About Running Out of Food in the Last Year: Never true    [place] in the Last Year: Never true  Transportation Needs: No Transportation Needs (01/22/2022)   PRAPARE - Hydrologist (Medical): No    Lack of Transportation (Non-Medical): No  Physical Activity: Insufficiently Active (01/22/2022)   Exercise Vital Sign    Days of Exercise per Week: 7 days    Minutes of Exercise per Session: 20 min  Stress: No Stress Concern Present (01/22/2022)   [place]    Feeling of Stress : Only a little  Social Connections: Moderately Integrated (01/22/2022)   Social Connection and Isolation Panel [NHANES]    Frequency of Communication with Friends and Family: More than three times a week    Frequency of Social Gatherings with Friends and Family: More than three times a week    Attends Religious Services: More than 4 times per year    Active Member of [employer] or Organizations: Yes    Attends Archivist Meetings: More than 4 times per year    Marital Status: Widowed  Intimate Partner Violence: Not At Risk (01/22/2022)   Humiliation, Afraid, Rape, and Kick questionnaire    Fear of Current or Ex-Partner: No    Emotionally Abused: No    Physically Abused: No    Sexually Abused: No    Family History:   Family History  Problem Relation Age of Onset   Heart attack Mother 56   Diabetes Mother    Hypertension Mother    Heart attack Father 33   Heart attack Brother 81       x 6  Heart disease Brother    Diabetes Brother    Colon cancer Paternal Aunt        12s, died with brain anuerysm   Crohn's disease Cousin        paternal   Diabetes Sister    GER disease Daughter    Cervical cancer Daughter    Diabetes Daughter      Physical Exam/Data:   Vitals:   10/05/22 0415 10/05/22 0433 10/05/22 0500 10/05/22 0600  BP: (!) 149/75  (!) 137/108 (!) 131/56  Pulse: 67  67 70  Resp: '17  13 15  '$ Temp:  97.9 F (36.6 C)    TempSrc:  Oral    SpO2: 100%  100% 99%  Weight:      Height:        Intake/Output Summary (Last 24 hours) at 10/05/2022 0651 Last data filed at 10/05/2022 0227 Gross per 24 hour  Intake 5.61 ml  Output --  Net 5.61 ml      10/04/2022   11:28 PM 09/20/2022    8:21 AM 06/20/2022    7:54 AM  Last 3 Weights  Weight (lbs) 160 lb 163 lb 155 lb 8 oz  Weight (kg) 72.576 kg 73.936 kg 70.534 kg     Body mass index is 25.82 kg/m.  General:  Well nourished, well developed, NAD HEENT: normal Lymph: no adenopathy Neck: no JVD Endocrine:  No thryomegaly Vascular: No carotid bruits; FA pulses 2+ bilaterally without bruits  Cardiac:  normal S1, S2; RRR. Mild tenderness to palpation along L sternal border.  Lungs:  clear to auscultation bilaterally, no wheezing, rhonchi or rales  Abd: soft, nontender, no hepatomegaly  Ext: no edema Musculoskeletal:  No deformities, BUE and BLE strength normal and equal Skin: warm and dry  Neuro:  CNs 2-12 intact, no focal abnormalities noted Psych:  Normal affect   EKG:  The EKG was personally reviewed and demonstrates:  NSR, delayed R wave progression, no ST changes.   Relevant CV Studies: Coronary Angiography 03/2022   Mid Cx lesion is 90% stenosed.   Prox LAD to Mid LAD lesion is 95% stenosed.   Previously placed Prox Cx to Mid Cx stent of unknown type is  widely patent.   Previously placed Mid LAD stent of unknown type is  widely patent.   A drug-eluting stent was successfully placed.   A drug-eluting  stent was successfully placed.   Post intervention, there is a 0% residual stenosis.   Post intervention, there is a 0% residual stenosis.   Post intervention, there is a 0% residual stenosis.  Laboratory Data:  High Sensitivity Troponin:   Recent Labs  Lab 10/04/22 2345 10/05/22 0130  TROPONINIHS 5 6     Chemistry Recent Labs  Lab 10/04/22 2345 10/04/22 2357 10/05/22 0500  NA 137 138  --   K 3.6 3.7  --   CL 102 100  --   CO2 26  --   --   GLUCOSE 266* 262*  --   BUN 14 15  --   CREATININE 1.22* 1.00 0.95  CALCIUM 8.6*  --   --   GFRNONAA 47*  --  >60  ANIONGAP 9  --   --     No results for input(s): "PROT", "ALBUMIN", "AST", "ALT", "ALKPHOS", "BILITOT" in the last 168 hours. Hematology Recent Labs  Lab 10/04/22 2345 10/04/22 2357 10/05/22 0500  WBC 6.9  --  6.4  RBC 3.68*  --  3.74*  HGB 10.7*  11.2* 11.2*  HCT 32.4* 33.0* 33.0*  MCV 88.0  --  88.2  MCH 29.1  --  29.9  MCHC 33.0  --  33.9  RDW 14.6  --  14.5  PLT 157  --  155   BNPNo results for input(s): "BNP", "PROBNP" in the last 168 hours.  DDimer No results for input(s): "DDIMER" in the last 168 hours.  Radiology/Studies:  DG Chest Portable 1 View  Result Date: 10/04/2022 CLINICAL DATA:  Chest pain. EXAM: PORTABLE CHEST 1 VIEW COMPARISON:  March 25, 2022 FINDINGS: The heart size and mediastinal contours are within normal limits. Coronary artery stents are in place. There is moderate severity calcification of the aortic arch. There is no evidence of acute infiltrate, pleural effusion or pneumothorax. The visualized skeletal structures are unremarkable. IMPRESSION: No active cardiopulmonary disease. Electronically Signed   By: Virgina Norfolk M.D.   On: 10/04/2022 23:43      TIMI Risk Score for Unstable Angina or Non-ST Elevation MI:   The patient's TIMI risk score is 5, which indicates a 26% risk of all cause mortality, new or recurrent myocardial infarction or need for urgent revascularization in the  next 14 days.   Assessment and Plan:   #Chest Pain Ongoing for several days. Quality is similar to pain experienced prior to [facility] and LCx in April 2023. No missed DAPT. Negative hsTnT x2, non-ischemic EKG. Nitro drip initiated in ED. Given that Ms. Spikes's pain is not entirely resolved despite continued escalation of anti-anginal therapies, known CAD with multiple prior interventions, and consistency of her pain with prior presentation (also with negative hsTnT at that time), would favor repeat coronary angiography.  - Continue DAPT (ASA '81mg'$  QD, Plavix '75mg'$  QD) - Continue home beta blockade: atenolol '50mg'$  BID - Continue amlodipine '10mg'$  QD - Continue rosuva '20mg'$  QD - Increase imdur to '60mg'$  BID - Consider addition of ranolazine '500mg'$  BID - Continue IV NTG infusion initiated in ED - Recommend repeat coronary angiography given persistent substernal chest pain, consistent with prior presentation, despite optimal medical therapies.   Cardiology team to see in AM with final recommendations to follow at that time.       For questions or updates, please contact [place] Please consult [email or web address] for contact info under    Signed, Delorse Limber, MD  10/05/2022 6:51 AM Patient seen and admitted.  She presents with 2 days of chest pressure similar to her symptoms prior to her previous PCI.  The pain has been continuous.  It is not pleuritic or positional and not related to food.  She has dyspnea on exertion which is chronic.  Her electrocardiogram shows no acute ST changes.  Troponins are normal.  Will plan to proceed with cardiac catheterization for definitive evaluation.  The risk and benefits including myocardial infarction, CVA and death discussed and she agrees to proceed.  Note she has a dye allergy we will premedicate with Solu-Medrol and Benadryl.  Continue preadmission medications. Kirk Ruths, MD

## 2022-10-05 NOTE — Progress Notes (Signed)
Subjective: Patient admitted this morning, see detailed H&P by Dr Nevada Crane 72 year old female with medical history of CAD s/p cardiac stenting x 3, essential hypertension, hyperlipidemia, diabetes mellitus type 2, chronic anxiety/depression who [facility] presented to [facility] due to send located chest pressure for past 2 days this was associated with dyspnea.  She was transferred to [facility] for possible heart catheterization.  In the ED she had 10/10 chest pressure, nitro drip started.  Cardiology was consulted.  Vitals:   10/05/22 0700 10/05/22 0810  BP: 122/60   Pulse: 66   Resp: 16   Temp:  97.6 F (36.4 C)  SpO2: 99%       A/P  Chest pain rule out ACS -Troponin was negative -EKG showed no acute abnormality -Echocardiogram has been ordered -Continue aspirin and Plavix -Continue atenolol, amlodipine -Cardiology to repeat coronary angiography given persistent substernal chest pain  Diabetes mellitus type 2 with hyperglycemia -Continue sliding scale insulin with NovoLog -HbA1c is 8.5  Coronary artery disease s/p PCI with stenting -Continue aspirin and atenolol  Hypertension -Continue atenolol  Chronic diastolic CHF -Patient is euvolemic on exam -Last 2D echo showed EF of 60 to 65% with grade 1 diastolic dysfunction   Oswald Hillock [facility]

## 2022-10-05 NOTE — Progress Notes (Signed)
  Echocardiogram 2D Echocardiogram has been performed.  [name] 10/05/2022, 10:52 AM

## 2022-10-06 DIAGNOSIS — R072 Precordial pain: Secondary | ICD-10-CM | POA: Diagnosis not present

## 2022-10-06 LAB — CBC
HCT: 29.8 % — ABNORMAL LOW (ref 36.0–46.0)
Hemoglobin: 9.6 g/dL — ABNORMAL LOW (ref 12.0–15.0)
MCH: 28.9 pg (ref 26.0–34.0)
MCHC: 32.2 g/dL (ref 30.0–36.0)
MCV: 89.8 fL (ref 80.0–100.0)
Platelets: 145 10*3/uL — ABNORMAL LOW (ref 150–400)
RBC: 3.32 MIL/uL — ABNORMAL LOW (ref 3.87–5.11)
RDW: 14.4 % (ref 11.5–15.5)
WBC: 7.2 10*3/uL (ref 4.0–10.5)
nRBC: 0 % (ref 0.0–0.2)

## 2022-10-06 LAB — GLUCOSE, CAPILLARY
Glucose-Capillary: 256 mg/dL — ABNORMAL HIGH (ref 70–99)
Glucose-Capillary: 257 mg/dL — ABNORMAL HIGH (ref 70–99)
Glucose-Capillary: 300 mg/dL — ABNORMAL HIGH (ref 70–99)

## 2022-10-06 LAB — BASIC METABOLIC PANEL
Anion gap: 8 (ref 5–15)
Anion gap: 9 (ref 5–15)
BUN: 13 mg/dL (ref 8–23)
BUN: 16 mg/dL (ref 8–23)
CO2: 21 mmol/L — ABNORMAL LOW (ref 22–32)
CO2: 24 mmol/L (ref 22–32)
Calcium: 7.8 mg/dL — ABNORMAL LOW (ref 8.9–10.3)
Calcium: 8.8 mg/dL — ABNORMAL LOW (ref 8.9–10.3)
Chloride: 96 mmol/L — ABNORMAL LOW (ref 98–111)
Chloride: 102 mmol/L (ref 98–111)
Creatinine, Ser: 0.95 mg/dL (ref 0.44–1.00)
Creatinine, Ser: 0.96 mg/dL (ref 0.44–1.00)
GFR, Estimated: >60 mL/min (ref >60)
GFR, Estimated: >60 mL/min (ref >60)
Glucose, Bld: 667 mg/dL (ref 70–99)
Glucose, Bld: 283 mg/dL — ABNORMAL HIGH (ref 70–99)
Potassium: 3.5 mmol/L (ref 3.5–5.1)
Potassium: 4 mmol/L (ref 3.5–5.1)
Sodium: 125 mmol/L — ABNORMAL LOW (ref 135–145)
Sodium: 135 mmol/L (ref 135–145)

## 2022-10-06 MED ORDER — NOVOLOG FLEXPEN 100 UNIT/ML ~~LOC~~ SOPN: 36.0000 [IU] | PEN_INJECTOR | Freq: Three times a day (TID) | SUBCUTANEOUS | Status: DC

## 2022-10-06 MED ORDER — ROPINIROLE HCL 0.5 MG PO TABS: 1.0000 mg | ORAL_TABLET | Freq: Every day | ORAL | Status: DC | PRN

## 2022-10-06 MED ORDER — INSULIN ASPART 100 UNIT/ML IJ SOLN: 0.0000 [IU] | Freq: Every day | INTRAMUSCULAR | Status: DC

## 2022-10-06 MED ORDER — INSULIN GLARGINE-YFGN 100 UNIT/ML ~~LOC~~ SOLN
30.0000 [IU] | Freq: Every day | SUBCUTANEOUS | Status: DC
  Filled 2022-10-06: qty 0.3

## 2022-10-06 MED ORDER — INSULIN DEGLUDEC 100 UNIT/ML ~~LOC~~ SOPN: 60.0000 [IU] | PEN_INJECTOR | Freq: Every day | SUBCUTANEOUS | Status: DC

## 2022-10-06 MED ORDER — INSULIN ASPART 100 UNIT/ML IJ SOLN
0.0000 [IU] | Freq: Three times a day (TID) | INTRAMUSCULAR | Status: DC
  Administered 2022-10-06: 8 [IU] via SUBCUTANEOUS

## 2022-10-06 NOTE — Progress Notes (Signed)
CARDIAC REHAB PHASE I   PRE:  Rate/Rhythm: 84  BP:  Supine: 144/65     SaO2: 97%  MODE:  Ambulation: 400 ft   POST:  Rate/Rhythem: 99  BP:    Sitting: 164/81 recheck BP 151/71   SaO2: 97% RA  1015-1058 Patient ambulated independently in the hallway without chest pain. Tolerated well. Patient was assisted back to bed with call bell within reach. Reviewed exercise instructions. Heart healthy diabetic diet information with the patient. Will place referral for [facility] phase 2 cardiac rehab. [name] says she does not think she is not interested in participating due to the weather and distance.   Harrell Gave RN

## 2022-10-06 NOTE — Progress Notes (Signed)
Rounding Note    Patient Name: [name] Date of Encounter: 10/06/2022  [place] Cardiologist: Minus Breeding, MD   Subjective   No CP or dyspnea  Inpatient Medications    Scheduled Meds:  amLODipine  10 mg Oral Daily   aspirin  81 mg Oral Daily   atenolol  12.5 mg Oral BID   clopidogrel  75 mg Oral Q breakfast   enoxaparin (LOVENOX) injection  40 mg Subcutaneous Daily   insulin aspart  0-15 Units Subcutaneous Q4H   isosorbide mononitrate  60 mg Oral Daily   rosuvastatin  20 mg Oral Daily   sertraline  100 mg Oral Daily   sodium chloride flush  3 mL Intravenous Q12H   Continuous Infusions:  sodium chloride     nitroGLYCERIN 100 mcg/min (10/06/22 0022)   PRN Meds: sodium chloride, acetaminophen, melatonin, nitroGLYCERIN, polyethylene glycol, prochlorperazine, sodium chloride flush, traZODone   Vital Signs    Vitals:   10/05/22 2200 10/05/22 2300 10/06/22 0000 10/06/22 0300  BP: (!) 106/52 (!) 106/55 127/65 120/61  Pulse: 75 74 73 75  Resp: '16 13 14 14  '$ Temp:  98 F (36.7 C)  98.1 F (36.7 C)  TempSrc:  Oral  Oral  SpO2: 93% 95% 94% 93%  Weight:      Height:        Intake/Output Summary (Last 24 hours) at 10/06/2022 0711 Last data filed at 10/06/2022 0307 Gross per 24 hour  Intake 405.15 ml  Output 700 ml  Net -294.85 ml      10/04/2022   11:28 PM 09/20/2022    8:21 AM 06/20/2022    7:54 AM  Last 3 Weights  Weight (lbs) 160 lb 163 lb 155 lb 8 oz  Weight (kg) 72.576 kg 73.936 kg 70.534 kg      Telemetry    Sinus - Personally Reviewed   Physical Exam   GEN: No acute distress.   Neck: No JVD Cardiac: RRR, no murmurs, rubs, or gallops.  Respiratory: Clear to auscultation bilaterally. GI: Soft, nontender, non-distended  MS: No edema; radial cath site with no hematoma Neuro:  Nonfocal  Psych: Normal affect   Labs    High Sensitivity Troponin:   Recent Labs  Lab 10/04/22 2345 10/05/22 0130  TROPONINIHS 5 6      Chemistry Recent Labs  Lab 10/05/22 0500 10/06/22 0016 10/06/22 0414  NA 136 125* 135  K 3.4* 3.5 4.0  CL 98 96* 102  CO2 27 21* 24  GLUCOSE 240* 667* 283*  BUN '12 13 16  '$ CREATININE 1.00  0.95 0.95 0.96  CALCIUM 8.9 7.8* 8.8*  GFRNONAA 59*  >60 >60 >60  ANIONGAP '11 8 9     '$ Hematology Recent Labs  Lab 10/04/22 2345 10/04/22 2357 10/05/22 0500 10/06/22 0016  WBC 6.9  --  6.4 7.2  RBC 3.68*  --  3.74* 3.32*  HGB 10.7* 11.2* 11.2* 9.6*  HCT 32.4* 33.0* 33.0* 29.8*  MCV 88.0  --  88.2 89.8  MCH 29.1  --  29.9 28.9  MCHC 33.0  --  33.9 32.2  RDW 14.6  --  14.5 14.4  PLT 157  --  155 145*     Radiology    CARDIAC CATHETERIZATION  Result Date: 10/05/2022 1.  Severe in-stent restenosis within the previously implanted LAD stent, treated successfully with high-pressure noncompliant balloon angioplasty reducing 95% stenosis to less than 10%, TIMI-3 flow pre and post 2.  Continued patency of  the circumflex stented segment with mild in-stent restenosis 3.  Moderate downstream mid LAD in-stent restenosis 4.  Patent RCA with mild diffuse nonobstructive plaquing 5.  Normal LVEDP Recommendations: Continue DAPT with aspirin and clopidogrel without interruption.  Favor long-term therapy with in this patient with multiple PCI procedures in the past, multiple stents, and in-stent restenotic episodes.   ECHOCARDIOGRAM COMPLETE  Result Date: 10/05/2022    ECHOCARDIOGRAM REPORT   Patient Name:   [name] Date of Exam: 10/05/2022 Medical Rec #:  [id number]     Height:       66.0 in Accession #:    [id number]    Weight:       160.0 lb Date of Birth:  1951-08-15      BSA:          1.819 m Patient Age:    72 years      BP:           131/56 mmHg Patient Gender: F             HR:           69 bpm. Exam Location:  Inpatient Procedure: 2D Echo and Intracardiac Opacification Agent Indications:    chest pain  History:        Patient has prior history of Echocardiogram examinations, most                  recent 09/22/2017. CAD, Signs/Symptoms:Chest Pain; Risk                 Factors:Diabetes, Hypertension and Dyslipidemia.  Sonographer:    Harvie Junior Referring Phys: [id number] [place]  Sonographer Comments: Technically difficult study due to poor echo windows. Image acquisition challenging due to patient body habitus. IMPRESSIONS  1. Left ventricular ejection fraction, by estimation, is 60 to 65%. The left ventricle has normal function. The left ventricle has no regional wall motion abnormalities. Left ventricular diastolic function could not be evaluated.  2. Right ventricular systolic function is normal. The right ventricular size is normal. There is normal pulmonary artery systolic pressure. The estimated right ventricular systolic pressure is 02.7 mmHg.  3. The mitral valve is degenerative. Mild mitral valve regurgitation. No evidence of mitral stenosis. Moderate to severe mitral annular calcification.  4. The aortic valve is tricuspid. Aortic valve regurgitation is not visualized. No aortic stenosis is present.  5. The inferior vena cava is normal in size with greater than 50% respiratory variability, suggesting right atrial pressure of 3 mmHg. Comparison(s): No significant change from prior study. FINDINGS  Left Ventricle: Left ventricular ejection fraction, by estimation, is 60 to 65%. The left ventricle has normal function. The left ventricle has no regional wall motion abnormalities. Definity contrast agent was given IV to delineate the left ventricular  endocardial borders. The left ventricular internal cavity size was normal in size. There is no left ventricular hypertrophy. Left ventricular diastolic function could not be evaluated due to mitral annular calcification (moderate or greater). Left ventricular diastolic function could not be evaluated. Right Ventricle: The right ventricular size is normal. No increase in right ventricular wall thickness. Right ventricular systolic function is normal.  There is normal pulmonary artery systolic pressure. The tricuspid regurgitant velocity is 1.85 m/s, and  with an assumed right atrial pressure of 3 mmHg, the estimated right ventricular systolic pressure is 74.1 mmHg. Left Atrium: Left atrial size was normal in size. Right Atrium: Right atrial size was normal in size. Pericardium: Trivial  pericardial effusion is present. Presence of epicardial fat layer. Mitral Valve: The mitral valve is degenerative in appearance. Moderate to severe mitral annular calcification. Mild mitral valve regurgitation. No evidence of mitral valve stenosis. Tricuspid Valve: The tricuspid valve is grossly normal. Tricuspid valve regurgitation is trivial. No evidence of tricuspid stenosis. Aortic Valve: The aortic valve is tricuspid. Aortic valve regurgitation is not visualized. No aortic stenosis is present. Aortic valve mean gradient measures 3.0 mmHg. Aortic valve peak gradient measures 4.7 mmHg. Aortic valve area, by VTI measures 2.71 cm. Pulmonic Valve: The pulmonic valve was grossly normal. Pulmonic valve regurgitation is trivial. No evidence of pulmonic stenosis. Aorta: The aortic root and ascending aorta are structurally normal, with no evidence of dilitation. Venous: The inferior vena cava is normal in size with greater than 50% respiratory variability, suggesting right atrial pressure of 3 mmHg. IAS/Shunts: The interatrial septum was not well visualized.  LEFT VENTRICLE PLAX 2D LVIDd:         4.30 cm     Diastology LVIDs:         2.90 cm     LV e' medial:    4.68 cm/s LV PW:         1.00 cm     LV E/e' medial:  23.9 LV IVS:        1.00 cm     LV e' lateral:   6.53 cm/s LVOT diam:     2.00 cm     LV E/e' lateral: 17.2 LV SV:         70 LV SV Index:   39 LVOT Area:     3.14 cm  LV Volumes (MOD) LV vol d, MOD A2C: 42.3 ml LV vol d, MOD A4C: 63.2 ml LV vol s, MOD A2C: 15.5 ml LV vol s, MOD A4C: 27.8 ml LV SV MOD A2C:     26.8 ml LV SV MOD A4C:     63.2 ml LV SV MOD BP:      33.3 ml  RIGHT VENTRICLE RV Basal diam:  3.00 cm RV Mid diam:    2.50 cm RV S prime:     10.60 cm/s TAPSE (M-mode): 1.7 cm LEFT ATRIUM           Index        RIGHT ATRIUM           Index LA diam:      3.65 cm 2.01 cm/m   RA Area:     12.30 cm LA Vol (A2C): 41.8 ml 22.98 ml/m  RA Volume:   30.70 ml  16.88 ml/m LA Vol (A4C): 54.0 ml 29.72 ml/m  AORTIC VALVE                    PULMONIC VALVE AV Area (Vmax):    2.94 cm     PV Vmax:          1.18 m/s AV Area (Vmean):   2.80 cm     PV Peak grad:     5.6 mmHg AV Area (VTI):     2.71 cm     PR End Diast Vel: 3.42 msec AV Vmax:           108.00 cm/s AV Vmean:          75.700 cm/s AV VTI:            0.260 m AV Peak Grad:      4.7 mmHg AV Mean Grad:  3.0 mmHg LVOT Vmax:         101.00 cm/s LVOT Vmean:        67.400 cm/s LVOT VTI:          0.224 m LVOT/AV VTI ratio: 0.86  AORTA Ao Root diam: 3.40 cm Ao Asc diam:  2.80 cm MITRAL VALVE                TRICUSPID VALVE MV Area (PHT): 3.12 cm     TR Peak grad:   13.7 mmHg MV Decel Time: 243 msec     TR Vmax:        185.00 cm/s MR Peak grad: 14.3 mmHg MR Vmax:      189.00 cm/s   SHUNTS MV E velocity: 112.00 cm/s  Systemic VTI:  0.22 m MV A velocity: 141.00 cm/s  Systemic Diam: 2.00 cm MV E/A ratio:  0.79 Eleonore Chiquito MD Electronically signed by Eleonore Chiquito MD Signature Date/Time: 10/05/2022/1:45:12 PM    Final    DG Chest Portable 1 View  Result Date: 10/04/2022 CLINICAL DATA:  Chest pain. EXAM: PORTABLE CHEST 1 VIEW COMPARISON:  March 25, 2022 FINDINGS: The heart size and mediastinal contours are within normal limits. Coronary artery stents are in place. There is moderate severity calcification of the aortic arch. There is no evidence of acute infiltrate, pleural effusion or pneumothorax. The visualized skeletal structures are unremarkable. IMPRESSION: No active cardiopulmonary disease. Electronically Signed   By: Virgina Norfolk M.D.   On: 10/04/2022 23:43      Patient Profile     72 y.o. female with past medical  history of coronary artery disease, hypertension, peripheral vascular disease, hyperlipidemia, diabetes mellitus admitted with chest pain.  Echocardiogram showed normal LV function, mild mitral regurgitation.  Cardiac catheterization revealed in-stent restenosis of 95% proximal to mid LAD.  Patient had PTCA.  Long-term dual antiplatelet therapy recommended.  Assessment & Plan    1 coronary artery disease-patient admitted with chest pain.  Enzymes negative.  Echocardiogram showed normal LV function.  Cardiac catheterization revealed severe in-stent restenosis of LAD and she underwent successful PTCA.  Continue aspirin, Plavix, beta-blocker, isosorbide and statin.  2 hypertension-patient's blood pressure is controlled.  Continue present medications at discharge.  3 hyperlipidemia-continue statin.  4 history of chronic diastolic congestive heart failure-patient is euvolemic.  Would resume home dose of diuretic at discharge.  Patient can be discharged from a cardiac standpoint on present medications.  Will arrange follow-up in [place] in 2 to 4 weeks.  Greater than 30 minutes PA and physician time. D2  For questions or updates, please contact [place] Please consult [email or web address] for contact info under        Signed, Kirk Ruths, MD  10/06/2022, 7:11 AM

## 2022-10-06 NOTE — Plan of Care (Signed)
  Problem: Clinical Measurements: Goal: Diagnostic test results will improve Outcome: Progressing Goal: Respiratory complications will improve Outcome: Progressing   Problem: Coping: Goal: Level of anxiety will decrease Outcome: Progressing   Problem: Elimination: Goal: Will not experience complications related to urinary retention Outcome: Progressing   Problem: Pain Managment: Goal: General experience of comfort will improve Outcome: Progressing   Problem: Safety: Goal: Ability to remain free from injury will improve Outcome: Progressing

## 2022-10-06 NOTE — Discharge Summary (Signed)
Physician Discharge Summary  [name]:[id number] DOB: May 22, 1951 DOA: 10/04/2022  PCP: Gwenlyn Perking, FNP  Admit date: 10/04/2022 Discharge date: 10/06/2022  Admitted From: home Discharge disposition: home   Recommendations for Outpatient Follow-Up:   Needs close GI follow up for EGd/colonoscopy Cards follow up Cbc 1 week   Discharge Diagnosis:   Active Problems:   Coronary artery disease involving native coronary artery of native heart with unstable angina pectoris ([place])   Chest pain    Discharge Condition: Improved.  Diet recommendation: Low sodium, heart healthy.  Carbohydrate-modified  Wound care: None.  Code status: Full.   History of Present Illness:   [name] is a 72 y.o. female with medical history significant for coronary artery disease status post cardiac stenting x 3, essential hypertension, hyperlipidemia, type 2 diabetes, chronic anxiety/depression, who initially presented to [facility] due to centrally located chest pressure for the past 2 days.  Associated with dyspnea.  Similar to prior episode of chest pain that led to heart catheterization.  Was transferred from [facility] to [facility] for possible heart cath.   In the ED, describes 10 out of 10 chest pressure.  Nitro drip started.  EDP discussed the case with cardiology, will see in consultation.   Hospital Course by Problem:   Chest pain rule out ACS --patient admitted with chest pain. Enzymes negative. Echocardiogram showed normal LV function. Cardiac catheterization revealed severe in-stent restenosis of LAD and she underwent successful PTCA. Continue aspirin, Plavix, beta-blocker, isosorbide and statin.    Diabetes mellitus type 2 with hyperglycemia -resume home meds -HbA1c is 8.5   Coronary artery disease s/p PCI with stenting -Continue aspirin    Hypertension -Continue home meds   Chronic diastolic CHF -Patient is euvolemic on exam -Last 2D echo  showed EF of 60 to 65% with grade 1 diastolic dysfunction      Medical Consultants:   cards   Discharge Exam:   Vitals:   10/06/22 0300 10/06/22 0700  BP: 120/61 131/68  Pulse: 75 75  Resp: 14 13  Temp: 98.1 F (36.7 C) 97.8 F (36.6 C)  SpO2: 93% 95%   Vitals:   10/05/22 2300 10/06/22 0000 10/06/22 0300 10/06/22 0700  BP: (!) 106/55 127/65 120/61 131/68  Pulse: 74 73 75 75  Resp: '13 14 14 13  '$ Temp: 98 F (36.7 C)  98.1 F (36.7 C) 97.8 F (36.6 C)  TempSrc: Oral  Oral Oral  SpO2: 95% 94% 93% 95%  Weight:      Height:        General exam: Appears calm and comfortable.    The results of significant diagnostics from this hospitalization (including imaging, microbiology, ancillary and laboratory) are listed below for reference.     Procedures and Diagnostic Studies:   CARDIAC CATHETERIZATION  Result Date: 10/05/2022 1.  Severe in-stent restenosis within the previously implanted LAD stent, treated successfully with high-pressure noncompliant balloon angioplasty reducing 95% stenosis to less than 10%, TIMI-3 flow pre and post 2.  Continued patency of the circumflex stented segment with mild in-stent restenosis 3.  Moderate downstream mid LAD in-stent restenosis 4.  Patent RCA with mild diffuse nonobstructive plaquing 5.  Normal LVEDP Recommendations: Continue DAPT with aspirin and clopidogrel without interruption.  Favor long-term therapy with in this patient with multiple PCI procedures in the past, multiple stents, and in-stent restenotic episodes.   ECHOCARDIOGRAM COMPLETE  Result Date: 10/05/2022    ECHOCARDIOGRAM REPORT  Patient Name:   [name] Date of Exam: 10/05/2022 Medical Rec #:  [id number]     Height:       66.0 in Accession #:    [id number]    Weight:       160.0 lb Date of Birth:  12-May-1951      BSA:          1.819 m Patient Age:    46 years      BP:           131/56 mmHg Patient Gender: F             HR:           69 bpm. Exam Location:  Inpatient  Procedure: 2D Echo and Intracardiac Opacification Agent Indications:    chest pain  History:        Patient has prior history of Echocardiogram examinations, most                 recent 09/22/2017. CAD, Signs/Symptoms:Chest Pain; Risk                 Factors:Diabetes, Hypertension and Dyslipidemia.  Sonographer:    Harvie Junior Referring Phys: [id number] [place]  Sonographer Comments: Technically difficult study due to poor echo windows. Image acquisition challenging due to patient body habitus. IMPRESSIONS  1. Left ventricular ejection fraction, by estimation, is 60 to 65%. The left ventricle has normal function. The left ventricle has no regional wall motion abnormalities. Left ventricular diastolic function could not be evaluated.  2. Right ventricular systolic function is normal. The right ventricular size is normal. There is normal pulmonary artery systolic pressure. The estimated right ventricular systolic pressure is 95.2 mmHg.  3. The mitral valve is degenerative. Mild mitral valve regurgitation. No evidence of mitral stenosis. Moderate to severe mitral annular calcification.  4. The aortic valve is tricuspid. Aortic valve regurgitation is not visualized. No aortic stenosis is present.  5. The inferior vena cava is normal in size with greater than 50% respiratory variability, suggesting right atrial pressure of 3 mmHg. Comparison(s): No significant change from prior study. FINDINGS  Left Ventricle: Left ventricular ejection fraction, by estimation, is 60 to 65%. The left ventricle has normal function. The left ventricle has no regional wall motion abnormalities. Definity contrast agent was given IV to delineate the left ventricular  endocardial borders. The left ventricular internal cavity size was normal in size. There is no left ventricular hypertrophy. Left ventricular diastolic function could not be evaluated due to mitral annular calcification (moderate or greater). Left ventricular diastolic  function could not be evaluated. Right Ventricle: The right ventricular size is normal. No increase in right ventricular wall thickness. Right ventricular systolic function is normal. There is normal pulmonary artery systolic pressure. The tricuspid regurgitant velocity is 1.85 m/s, and  with an assumed right atrial pressure of 3 mmHg, the estimated right ventricular systolic pressure is 84.1 mmHg. Left Atrium: Left atrial size was normal in size. Right Atrium: Right atrial size was normal in size. Pericardium: Trivial pericardial effusion is present. Presence of epicardial fat layer. Mitral Valve: The mitral valve is degenerative in appearance. Moderate to severe mitral annular calcification. Mild mitral valve regurgitation. No evidence of mitral valve stenosis. Tricuspid Valve: The tricuspid valve is grossly normal. Tricuspid valve regurgitation is trivial. No evidence of tricuspid stenosis. Aortic Valve: The aortic valve is tricuspid. Aortic valve regurgitation is not visualized. No aortic stenosis is present. Aortic valve mean gradient  measures 3.0 mmHg. Aortic valve peak gradient measures 4.7 mmHg. Aortic valve area, by VTI measures 2.71 cm. Pulmonic Valve: The pulmonic valve was grossly normal. Pulmonic valve regurgitation is trivial. No evidence of pulmonic stenosis. Aorta: The aortic root and ascending aorta are structurally normal, with no evidence of dilitation. Venous: The inferior vena cava is normal in size with greater than 50% respiratory variability, suggesting right atrial pressure of 3 mmHg. IAS/Shunts: The interatrial septum was not well visualized.  LEFT VENTRICLE PLAX 2D LVIDd:         4.30 cm     Diastology LVIDs:         2.90 cm     LV e' medial:    4.68 cm/s LV PW:         1.00 cm     LV E/e' medial:  23.9 LV IVS:        1.00 cm     LV e' lateral:   6.53 cm/s LVOT diam:     2.00 cm     LV E/e' lateral: 17.2 LV SV:         70 LV SV Index:   39 LVOT Area:     3.14 cm  LV Volumes (MOD) LV vol  d, MOD A2C: 42.3 ml LV vol d, MOD A4C: 63.2 ml LV vol s, MOD A2C: 15.5 ml LV vol s, MOD A4C: 27.8 ml LV SV MOD A2C:     26.8 ml LV SV MOD A4C:     63.2 ml LV SV MOD BP:      33.3 ml RIGHT VENTRICLE RV Basal diam:  3.00 cm RV Mid diam:    2.50 cm RV S prime:     10.60 cm/s TAPSE (M-mode): 1.7 cm LEFT ATRIUM           Index        RIGHT ATRIUM           Index LA diam:      3.65 cm 2.01 cm/m   RA Area:     12.30 cm LA Vol (A2C): 41.8 ml 22.98 ml/m  RA Volume:   30.70 ml  16.88 ml/m LA Vol (A4C): 54.0 ml 29.72 ml/m  AORTIC VALVE                    PULMONIC VALVE AV Area (Vmax):    2.94 cm     PV Vmax:          1.18 m/s AV Area (Vmean):   2.80 cm     PV Peak grad:     5.6 mmHg AV Area (VTI):     2.71 cm     PR End Diast Vel: 3.42 msec AV Vmax:           108.00 cm/s AV Vmean:          75.700 cm/s AV VTI:            0.260 m AV Peak Grad:      4.7 mmHg AV Mean Grad:      3.0 mmHg LVOT Vmax:         101.00 cm/s LVOT Vmean:        67.400 cm/s LVOT VTI:          0.224 m LVOT/AV VTI ratio: 0.86  AORTA Ao Root diam: 3.40 cm Ao Asc diam:  2.80 cm MITRAL VALVE  TRICUSPID VALVE MV Area (PHT): 3.12 cm     TR Peak grad:   13.7 mmHg MV Decel Time: 243 msec     TR Vmax:        185.00 cm/s MR Peak grad: 14.3 mmHg MR Vmax:      189.00 cm/s   SHUNTS MV E velocity: 112.00 cm/s  Systemic VTI:  0.22 m MV A velocity: 141.00 cm/s  Systemic Diam: 2.00 cm MV E/A ratio:  0.79 Eleonore Chiquito MD Electronically signed by Eleonore Chiquito MD Signature Date/Time: 10/05/2022/1:45:12 PM    Final    DG Chest Portable 1 View  Result Date: 10/04/2022 CLINICAL DATA:  Chest pain. EXAM: PORTABLE CHEST 1 VIEW COMPARISON:  March 25, 2022 FINDINGS: The heart size and mediastinal contours are within normal limits. Coronary artery stents are in place. There is moderate severity calcification of the aortic arch. There is no evidence of acute infiltrate, pleural effusion or pneumothorax. The visualized skeletal structures are unremarkable.  IMPRESSION: No active cardiopulmonary disease. Electronically Signed   By: Virgina Norfolk M.D.   On: 10/04/2022 23:43     Labs:   Basic Metabolic Panel: Recent Labs  Lab 10/04/22 2345 10/04/22 2357 10/05/22 0500 10/06/22 0016 10/06/22 0414  NA 137 138 136 125* 135  K 3.6 3.7 3.4* 3.5 4.0  CL 102 100 98 96* 102  CO2 26  --  27 21* 24  GLUCOSE 266* 262* 240* 667* 283*  BUN '14 15 12 13 16  '$ CREATININE 1.22* 1.00 1.00  0.95 0.95 0.96  CALCIUM 8.6*  --  8.9 7.8* 8.8*   GFR Estimated Creatinine Clearance: 53.2 mL/min (by C-G formula based on SCr of 0.96 mg/dL). Liver Function Tests: No results for input(s): "AST", "ALT", "ALKPHOS", "BILITOT", "PROT", "ALBUMIN" in the last 168 hours. No results for input(s): "LIPASE", "AMYLASE" in the last 168 hours. No results for input(s): "AMMONIA" in the last 168 hours. Coagulation profile No results for input(s): "INR", "PROTIME" in the last 168 hours.  CBC: Recent Labs  Lab 10/04/22 2345 10/04/22 2357 10/05/22 0500 10/06/22 0016  WBC 6.9  --  6.4 7.2  HGB 10.7* 11.2* 11.2* 9.6*  HCT 32.4* 33.0* 33.0* 29.8*  MCV 88.0  --  88.2 89.8  PLT 157  --  155 145*   Cardiac Enzymes: No results for input(s): "CKTOTAL", "CKMB", "CKMBINDEX", "TROPONINI" in the last 168 hours. BNP: Invalid input(s): "POCBNP" CBG: Recent Labs  Lab 10/05/22 2038 10/05/22 2317 10/06/22 0105 10/06/22 0550 10/06/22 0739  GLUCAP 293* 303* 256* 257* 300*   D-Dimer No results for input(s): "DDIMER" in the last 72 hours. Hgb A1c Recent Labs    10/05/22 0500  HGBA1C 8.5*   Lipid Profile No results for input(s): "CHOL", "HDL", "LDLCALC", "TRIG", "CHOLHDL", "LDLDIRECT" in the last 72 hours. Thyroid function studies No results for input(s): "TSH", "T4TOTAL", "T3FREE", "THYROIDAB" in the last 72 hours.  Invalid input(s): "FREET3" Anemia work up No results for input(s): "VITAMINB12", "FOLATE", "FERRITIN", "TIBC", "IRON", "RETICCTPCT" in the last 72  hours. Microbiology No results found for this or any previous visit (from the past 240 hour(s)).   Discharge Instructions:   Discharge Instructions     AMB Referral to Cardiac Rehabilitation - Phase II   Complete by: As directed    Diagnosis: Coronary Stents   After initial evaluation and assessments completed: Virtual Based Care may be provided alone or in conjunction with Phase 2 Cardiac Rehab based on patient barriers.: Yes   Intensive Cardiac Rehabilitation (ICR)  MC location only OR Traditional Cardiac Rehabilitation (TCR) *If criteria for ICR are not met will enroll in TCR [facility] only): Yes   Diet - low sodium heart healthy   Complete by: As directed    Diet Carb Modified   Complete by: As directed    Discharge instructions   Complete by: As directed    Call GI doctor to schedule EGD/colonoscopy Cbc 1 week   Increase activity slowly   Complete by: As directed       Allergies as of 10/06/2022       Reactions   Iohexol     Desc: pt had syncopal episode with nausea post IV CM late 1990's,  pt has had prednisone prep with heart caths x 2 without problem  kdean 04/16/07, Onset Date: [id number]   Ticlid [ticlopidine Hcl] Nausea And Vomiting   Jardiance [empagliflozin] Other (See Comments)   Recurrent UTIs   Metformin And Related Diarrhea   Codeine Nausea And Vomiting, Palpitations        Medication List     TAKE these medications    amLODipine 10 MG tablet Commonly known as: NORVASC Take 1 tablet (10 mg total) by mouth daily.   aspirin 81 MG chewable tablet Chew 1 tablet (81 mg total) by mouth daily.   atenolol 50 MG tablet Commonly known as: TENORMIN Take 1 tablet (50 mg total) by mouth 2 (two) times daily.   clopidogrel 75 MG tablet Commonly known as: PLAVIX Take 1 tablet (75 mg total) by mouth daily with breakfast.   FreeStyle Libre 2 Reader Kerrin Mo USE TO TEST BLOOD SUGAR 6 TIMES DAILY. Dx:E11.65   FreeStyle Libre 2 Sensor Misc USE TO TEST BLOOD SUGAR 6  TIMES DAILY   furosemide 40 MG tablet Commonly known as: LASIX Take 1 tablet (40 mg total) by mouth daily.   isosorbide mononitrate 60 MG 24 hr tablet Commonly known as: IMDUR Take 1 tablet (60 mg total) by mouth daily.   nitroGLYCERIN 0.4 MG SL tablet Commonly known as: NITROSTAT DISSOLVE 1 TABLET UNDER TONGUE FOR CHESTPAIN.MAY REPEAT EVERY 5 MINUTES FOR 3 DOSES.IF NO RELIEF CALL 911 OR GO TO ER   NovoLOG FlexPen 100 UNIT/ML FlexPen Generic drug: insulin aspart Inject 36 Units into the skin 3 (three) times daily with meals.   Ozempic (1 MG/DOSE) 2 MG/1.5ML Sopn Generic drug: Semaglutide (1 MG/DOSE) Inject 1 mg into the skin once a week.   potassium chloride 10 MEQ tablet Commonly known as: KLOR-CON M Take 1 tablet (10 mEq total) by mouth 2 (two) times daily.   rOPINIRole 0.5 MG tablet Commonly known as: Requip Take 2 tablets (1 mg total) by mouth daily as needed (restless leg).   rosuvastatin 20 MG tablet Commonly known as: CRESTOR TAKE ONE (1) TABLET BY MOUTH EVERY DAY   sertraline 100 MG tablet Commonly known as: ZOLOFT Take 1 tablet (100 mg total) by mouth daily.   traZODone 100 MG tablet Commonly known as: DESYREL Take 1 tablet (100 mg total) by mouth at bedtime as needed for sleep.   Tyler Aas FlexTouch 100 UNIT/ML FlexTouch Pen Generic drug: insulin degludec Inject 60 Units into the skin daily.        Follow-up Information     Minus Breeding, MD Follow up.   Specialty: Cardiology Why: Hospital follow-up with Cardiology scheduled for 10/17/2022 at 10:20am. Please arrive 15 minutes early for check-in. If this date/time does not work for you, please call our office to reschedule. Contact information: [address]  [place] [address] [phone]                  Time coordinating discharge: 45 min  Signed:  Geradine Girt DO  [facility] 10/06/2022, 8:13 AM

## 2022-10-06 NOTE — Discharge Instructions (Signed)
Post Cardiac Catheterization: NO HEAVY LIFTING OR SEXUAL ACTIVITY X 7 DAYS. NO DRIVING X 3-5 DAYS. NO SOAKING BATHS, HOT TUBS, POOLS, ETC., X 7 DAYS.  Radial Site Care: Refer to this sheet in the next few weeks. These instructions provide you with information on caring for yourself after your procedure. Your caregiver may also give you more specific instructions. Your treatment has been planned according to current medical practices, but problems sometimes occur. Call your caregiver if you have any problems or questions after your procedure. HOME CARE INSTRUCTIONS  You may shower the day after the procedure.Remove the bandage (dressing) and gently wash the site with plain soap and water.Gently pat the site dry.   Do not apply powder or lotion to the site.   Do not submerge the affected site in water for 3 to 5 days.   Inspect the site at least twice daily.   Do not flex or bend the affected arm for 24 hours.   No lifting over 5 pounds (2.3 kg) for 5 days after your procedure.   Do not drive home if you are discharged the same day of the procedure. Have someone else drive you.  What to expect:  Any bruising will usually fade within 1 to 2 weeks.   Blood that collects in the tissue (hematoma) may be painful to the touch. It should usually decrease in size and tenderness within 1 to 2 weeks.  SEEK IMMEDIATE MEDICAL CARE IF:  You have unusual pain at the radial site.   You have redness, warmth, swelling, or pain at the radial site.   You have drainage (other than a small amount of blood on the dressing).   You have chills.   You have a fever or persistent symptoms for more than 72 hours.   You have a fever and your symptoms suddenly get worse.   Your arm becomes pale, cool, tingly, or numb.   You have heavy bleeding from the site. Hold pressure on the site.    

## 2022-10-06 NOTE — Progress Notes (Signed)
RN received critical Glucose of 667, RN notified patient's RN

## 2022-10-08 ENCOUNTER — Telehealth: Payer: Self-pay | Admitting: *Deleted

## 2022-10-08 ENCOUNTER — Encounter (HOSPITAL_COMMUNITY): Payer: Self-pay | Admitting: Cardiovascular Disease

## 2022-10-08 MED FILL — Nitroglycerin IV Soln 100 MCG/ML in D5W: INTRA_ARTERIAL | Qty: 10 | Status: AC

## 2022-10-08 MED FILL — Heparin Sod (Porcine)-NaCl IV Soln 1000 Unit/500ML-0.9%: INTRAVENOUS | Qty: 1000 | Status: AC

## 2022-10-08 NOTE — Progress Notes (Signed)
  Care Coordination  Note  10/08/2022 Name: [name] MRN: [id number] DOB: 07-05-1951  [name] is a 72 y.o. year old primary care patient of Gwenlyn Perking, FNP. I reached out to Janus Molder by phone today to assist with scheduling a follow up appointment. Janus Molder verbally consented to my assistance.       Follow up plan: Hospital Follow Up appointment scheduled with Lilia Pro, Arman Bogus, FNP) on (10/12/22) at (1:15 PM).  [place]  Direct Dial: [phone]

## 2022-10-08 NOTE — Patient Outreach (Signed)
  Care Coordination [facility] Note Transition Care Management Follow-up Telephone Call Date of discharge and from where: [facility] on 10/06/22 How have you been since you were released from the hospital? "I'm feeling pretty good. Just having some wrist pain but I think that's because of the weather." Any questions or concerns? No  Items Reviewed: Did the pt receive and understand the discharge instructions provided? Yes  Medications obtained and verified? Yes  Other? Yes . Needs GI referral for EGD/colonoscopy. Hasn't seen one in about 10 years. Will request from PCP.  Any new allergies since your discharge? No  Dietary orders reviewed? Yes. Low sodium/heart healthy/carb modified Do you have support at home? Yes   Home Care and Equipment/Supplies: Were home health services ordered? no If so, what is the name of the agency?   Has the agency set up a time to come to the patient's home? not applicable Were any new equipment or medical supplies ordered?  No What is the name of the medical supply agency?  Were you able to get the supplies/equipment? not applicable Do you have any questions related to the use of the equipment or supplies? No  Functional Questionnaire: (I = Independent and D = Dependent) ADLs: I  Bathing/Dressing- I  Meal Prep- I  Eating- I  Maintaining continence- I  Transferring/Ambulation- I  Managing Meds- I  Follow up appointments reviewed:  PCP Hospital f/u appt confirmed? No   Specialist Hospital f/u appt confirmed? Yes  Scheduled to see Dr Percival Spanish 10/17/22 at 10:20 Are transportation arrangements needed? No  If their condition worsens, is the pt aware to call PCP or go to the Emergency Dept.? Yes Was the patient provided with contact information for the PCP's office or ED? Yes Was to pt encouraged to call back with questions or concerns? Yes  SDOH assessments and interventions completed:   Yes SDOH Interventions Today    Flowsheet Row Most Recent Value  SDOH  Interventions   Housing Interventions Intervention Not Indicated  Transportation Interventions Intervention Not Indicated  Financial Strain Interventions Intervention Not Indicated       Care Coordination Interventions:  PCP follow up appointment requested Referral to GI for EGD/Colonoscopy requested from PCP    Encounter Outcome:  Pt. Visit Completed    Chong Sicilian, BSN, RN-BC RN Care Coordinator [place] Direct Dial: [phone] Main #: [phone]

## 2022-10-10 ENCOUNTER — Telehealth: Payer: Self-pay | Admitting: Pharmacist

## 2022-10-10 ENCOUNTER — Other Ambulatory Visit: Payer: Self-pay | Admitting: Family Medicine

## 2022-10-10 DIAGNOSIS — Z1211 Encounter for screening for malignant neoplasm of colon: Secondary | ICD-10-CM

## 2022-10-10 DIAGNOSIS — K921 Melena: Secondary | ICD-10-CM

## 2022-10-10 NOTE — Telephone Encounter (Signed)
  Care Management   Follow Up Note   10/10/2022 Name: [name] MRN: [id number] DOB: 1951/04/06   Referred by: Gwenlyn Perking, FNP Reason for referral : Appointment  Unsuccessful outreach x2.  Need to reschedule patient for tomorrow's visit.  Notes recent ED admission for chest pain.  Patient is also interested in [place] system and would like to see Rayetta Pigg. NP at [employer].    Regina Eck, PharmD, BCPS, BCACP Clinical Pharmacist, [place]  II  T [phone]

## 2022-10-11 ENCOUNTER — Encounter: Payer: Self-pay | Admitting: Gastroenterology

## 2022-10-11 ENCOUNTER — Telehealth: Payer: Self-pay | Admitting: Pharmacist

## 2022-10-11 ENCOUNTER — Ambulatory Visit (INDEPENDENT_AMBULATORY_CARE_PROVIDER_SITE_OTHER): Payer: PPO | Admitting: Pharmacist

## 2022-10-11 DIAGNOSIS — N1831 Chronic kidney disease, stage 3a: Secondary | ICD-10-CM

## 2022-10-11 DIAGNOSIS — E1165 Type 2 diabetes mellitus with hyperglycemia: Secondary | ICD-10-CM

## 2022-10-11 NOTE — Progress Notes (Signed)
Chronic Care Management Pharmacy Note  10/11/2022 Name:  [name] MRN:  [id number] DOB:  [birth year]  Summary:  Diabetes: Goal on track: NO. Uncontrolled-A1c 7.4-->9.4% variable BG readings; FBG okay/post prandials high GFR 45-->80-->59 CKD 3a Reports FBG <150, postprandials 200s Patient could benefit from omnipod -- referral to [facility] endocrinology  Past intolerances: metformin, farxiga (SGLT2s) Current treatment: Tresiba 60 units, Novolog 20 units 2x w/ meals (only eats 2 meals daily), Ozempic '1mg'$  weekly;  PLAN: Continue Tresiba 50-60 units every morning, Novolog 20-30 units with meals (2x daily, only eats 2 meals), Continue ozempic '1mg'$  weekly Post prandials higher--instructed patient to take insulin 55mn before eating-->must cover meals Avg 2 meals/day--must be consistent  Denies personal and family history of Medullary thyroid cancer (MTC) Refills sent for patient assistance with novo nordisk 2024 (tresiba, novolog, ozempic) Reports hyper/hypoglycemia (previously had) Discussed meal planning options and Plate method for healthy eating Avoid sugary drinks and desserts Incorporate balanced protein, non starchy veggies, 1 serving of carbohydrate with each meal Increase water intake Increase physical activity as able Discussed FOLLOWING A HEART HEALTHY DIET/HEALTHY PLATE METHOD Lipids currently controlled on rosuvastatin '40mg'$  Current exercise: n/a  Patient Goals/Self-Care Activities patient will:  - take medications as prescribed as evidenced by patient report and record review check glucose using libre 2 CGM, document, and provide at future appointments collaborate with provider on medication access solutions   Subjective: Karen CRILLYis an 72y.o. year old female who is a primary patient of MGwenlyn Perking FNP.  The patient was referred to the Chronic Care Management team for assistance with care management needs subsequent to provider initiation of CCM services  and plan of care.    Engaged with patient by telephone for follow up visit in response to provider referral for CCM services.   Objective:  LABS:   Lab Results  Component Value Date   CREATININE 0.96 10/06/2022   CREATININE 0.95 10/06/2022   CREATININE 0.95 10/05/2022   CREATININE 1.00 10/05/2022     Lab Results  Component Value Date   HGBA1C 8.5 (H) 10/05/2022         Component Value Date/Time   CHOL 124 09/20/2022 0835   CHOL 107 02/05/2013 1002   TRIG 90 09/20/2022 0835   TRIG 149 11/30/2015 0947   TRIG 143 02/05/2013 1002   HDL 43 09/20/2022 0835   HDL 35 (L) 11/30/2015 0947   HDL 35 (L) 02/05/2013 1002   CHOLHDL 2.9 09/20/2022 0835   CHOLHDL 2.9 03/27/2022 0537   VLDL 22 03/27/2022 0537   LDLCALC 64 09/20/2022 0835   LDLCALC 195 (H) 04/26/2014 1029   LDLCALC 43 02/05/2013 1002   LDLDIRECT 93 04/06/2015 1038     Clinical ASCVD: No   The ASCVD Risk score (Arnett DK, et al., 2019) failed to calculate for the following reasons:   The valid total cholesterol range is 130 to 320 mg/dL    Other: (CHADS2VASc if Afib, PHQ9 if depression, MMRC or CAT for COPD, ACT, DEXA)    BP Readings from Last 3 Encounters:  10/06/22 131/68  09/20/22 (!) 140/68  06/20/22 (!) 140/78      SDOH:  (Social Determinants of Health) assessments and interventions performed:    Allergies  Allergen Reactions   Iohexol      Desc: pt had syncopal episode with nausea post IV CM late 1990's,  pt has had prednisone prep with heart caths x 2 without problem  kdean 04/16/07, Onset Date: [id number]  Ticlid [Ticlopidine Hcl] Nausea And Vomiting   Jardiance [Empagliflozin] Other (See Comments)    Recurrent UTIs   Metformin And Related Diarrhea   Codeine Nausea And Vomiting and Palpitations    Medications Reviewed Today     Reviewed by Lavera Guise, [facility] (Pharmacist) on 10/11/22 at [place] List Status: <None>   Medication Order Taking? Sig Documenting Provider Last Dose Status  Informant  amLODipine (NORVASC) 10 MG tablet [id number] No Take 1 tablet (10 mg total) by mouth daily. Mendel Corning, MD 10/04/2022 Active Self, Pharmacy Records  aspirin 81 MG chewable tablet [id number] No Chew 1 tablet (81 mg total) by mouth daily. Mendel Corning, MD 10/04/2022 Active Self, Pharmacy Records  atenolol (TENORMIN) 50 MG tablet [id number] No Take 1 tablet (50 mg total) by mouth 2 (two) times daily. Gwenlyn Perking, FNP 10/04/2022 18:00 Active Self, Pharmacy Records  clopidogrel (PLAVIX) 75 MG tablet [id number] No Take 1 tablet (75 mg total) by mouth daily with breakfast. Mendel Corning, MD 10/04/2022 Active Self, Pharmacy Records  Continuous Blood Gluc Receiver (FREESTYLE LIBRE 2 READER) DEVI [id number] No USE TO TEST BLOOD SUGAR 6 TIMES DAILY. Dx:E11.65 Gwenlyn Perking, FNP Taking Active Self, Pharmacy Records  Continuous Blood Gluc Sensor (FREESTYLE LIBRE 2 SENSOR) [place] [id number] No USE TO TEST BLOOD SUGAR 6 TIMES DAILY Gwenlyn Perking, FNP Taking Active Self, Pharmacy Records  furosemide (LASIX) 40 MG tablet [id number] No Take 1 tablet (40 mg total) by mouth daily. Gwenlyn Perking, FNP 10/04/2022 Active Self, Pharmacy Records  insulin aspart (NOVOLOG FLEXPEN) 100 UNIT/ML FlexPen [id number]  Inject 36 Units into the skin 3 (three) times daily with meals. Geradine Girt, DO  Active   insulin degludec (TRESIBA FLEXTOUCH) 100 UNIT/ML FlexTouch Pen [id number] No Inject 60 Units into the skin daily. [provider] 10/04/2022 Active Self, Pharmacy Records           Med Note [facility]   Mon Mar 26, 2022  8:28 AM)    isosorbide mononitrate (IMDUR) 60 MG 24 hr tablet [id number] No Take 1 tablet (60 mg total) by mouth daily. Mendel Corning, MD 10/04/2022 Active Self, Pharmacy Records  nitroGLYCERIN (NITROSTAT) 0.4 MG SL tablet [id number] No DISSOLVE 1 TABLET UNDER TONGUE FOR CHESTPAIN.MAY REPEAT EVERY 5 MINUTES FOR 3 DOSES.IF NO RELIEF CALL 911 OR GO TO ER Rai, Vernelle Emerald, MD  10/04/2022 Active Self, Pharmacy Records  potassium chloride (KLOR-CON M) 10 MEQ tablet [id number] No Take 1 tablet (10 mEq total) by mouth 2 (two) times daily. Gwenlyn Perking, FNP 10/04/2022 Active Self, Pharmacy Records  rOPINIRole (REQUIP) 0.5 MG tablet [id number]  Take 2 tablets (1 mg total) by mouth daily as needed (restless leg). Geradine Girt, DO  Active   rosuvastatin (CRESTOR) 20 MG tablet [id number] No TAKE ONE (1) TABLET BY MOUTH EVERY DAY Gwenlyn Perking, FNP 10/04/2022 Active Self, Pharmacy Records  Semaglutide, 1 MG/DOSE, (OZEMPIC, 1 MG/DOSE,) 2 MG/1.5ML SOPN [id number] No Inject 1 mg into the skin once a week. [provider] 10/04/2022 Active Self, Pharmacy Records           Med Note [address]   Mon Mar 26, 2022  8:37 AM) On thursdays  sertraline (ZOLOFT) 100 MG tablet [id number] No Take 1 tablet (100 mg total) by mouth daily. Gwenlyn Perking, FNP 10/04/2022 Active Self, Pharmacy Records  traZODone (DESYREL) 100 MG tablet [id number] No Take 1 tablet (100 mg total) by mouth at bedtime as needed  for sleep. Gwenlyn Perking, FNP 10/04/2022 Active Self, Pharmacy Records  Med List Note Gwenlyn Perking, [facility] 08/09/21 1015): nov              Goals Addressed               This Visit's Progress     Patient Stated     T2DM PHARMD (pt-stated)        Current Barriers:  Unable to independently afford treatment regimen Unable to achieve control of T2DM  Unable to maintain control of T2DM  Pharmacist Clinical Goal(s):  patient will verbalize ability to afford treatment regimen achieve control of t2dm as evidenced by goal a1c<7% through collaboration with PharmD and provider.   Interventions: 1:1 collaboration with Gwenlyn Perking, FNP regarding development and update of comprehensive plan of care as evidenced by provider attestation and co-signature Inter-disciplinary care team collaboration (see longitudinal plan of care) Comprehensive medication review  performed; medication list updated in electronic medical record  Diabetes: Goal on track: NO. Uncontrolled-A1c 7.4-->9.4% variable BG readings; FBG okay/post prandials high GFR 45-->80-->59 CKD 3a Reports FBG <150, postprandials 200s Past intolerances: metformin, farxiga (SGLT2s) Current treatment: Tresiba 60 units, Novolog 20 units 2x w/ meals (only eats 2 meals daily), Ozempic '1mg'$  weekly;  PLAN: Continue Tresiba 50-60 units every morning, Novolog 20-30 units with meals (2x daily, only eats 2 meals), Continue ozempic '1mg'$  weekly Post prandials higher--instructed patient to take insulin 29mn before eating-->must cover meals Avg 2 meals/day--must be consistent  Denies personal and family history of Medullary thyroid cancer (MTC) Refills sent for patient assistance with novo nordisk 2024 (tresiba, novolog, ozempic) Reports hyper/hypoglycemia (previously had) Discussed meal planning options and Plate method for healthy eating Avoid sugary drinks and desserts Incorporate balanced protein, non starchy veggies, 1 serving of carbohydrate with each meal Increase water intake Increase physical activity as able Discussed FOLLOWING A HEART HEALTHY DIET/HEALTHY PLATE METHOD Lipids currently controlled on rosuvastatin '40mg'$  Current exercise: n/a  Patient Goals/Self-Care Activities patient will:  - take medications as prescribed as evidenced by patient report and record review check glucose using libre 2 CGM, document, and provide at future appointments collaborate with provider on medication access solutions         Plan: Telephone follow up appointment with care management team member scheduled for:  3 months    JRegina Eck PharmD, BCPS, BEmeryClinical Pharmacist, [place]  T [phone]

## 2022-10-11 NOTE — Patient Instructions (Signed)
Visit Information  Following are the goals we discussed today:  Current Barriers:  Unable to independently afford treatment regimen Unable to achieve control of T2DM  Unable to maintain control of T2DM  Pharmacist Clinical Goal(s):  patient will verbalize ability to afford treatment regimen achieve control of t2dm as evidenced by goal a1c<7% through collaboration with PharmD and provider.   Interventions: 1:1 collaboration with Karen Perking, FNP regarding development and update of comprehensive plan of care as evidenced by provider attestation and co-signature Inter-disciplinary care team collaboration (see longitudinal plan of care) Comprehensive medication review performed; medication list updated in electronic medical record  Diabetes: Goal on track: NO. Uncontrolled-A1c 7.4-->9.4% variable BG readings; FBG okay/post prandials high GFR 45-->80-->59 CKD 3a Reports FBG <150, postprandials 200s Past intolerances: metformin, farxiga (SGLT2s) Current treatment: Tresiba 60 units, Novolog 20 units 2x w/ meals (only eats 2 meals daily), Ozempic '1mg'$  weekly;  PLAN: Continue Tresiba 50-60 units every morning, Novolog 20-30 units with meals (2x daily, only eats 2 meals), Continue ozempic '1mg'$  weekly Post prandials higher--instructed patient to take insulin 15mn before eating-->must cover meals Avg 2 meals/day--must be consistent  Denies personal and family history of Medullary thyroid cancer (MTC) Refills sent for patient assistance with novo nordisk 2024 (tresiba, novolog, ozempic) Reports hyper/hypoglycemia (previously had) Discussed meal planning options and Plate method for healthy eating Avoid sugary drinks and desserts Incorporate balanced protein, non starchy veggies, 1 serving of carbohydrate with each meal Increase water intake Increase physical activity as able Discussed FOLLOWING A HEART HEALTHY DIET/HEALTHY PLATE METHOD Lipids currently controlled on rosuvastatin  '40mg'$  Current exercise: n/a  Patient Goals/Self-Care Activities patient will:  - take medications as prescribed as evidenced by patient report and record review check glucose using libre 2 CGM, document, and provide at future appointments collaborate with provider on medication access solutions   Plan: Telephone follow up appointment with care management team member scheduled for:  3 months  Signature JRegina Eck PharmD, BCPS, BCACP Clinical Pharmacist, [place]  T [phone]  Please call the care guide team at [phone] you need to cancel or reschedule your appointment.   The patient verbalized understanding of instructions, educational materials, and care plan provided today and DECLINED offer to receive copy of patient instructions, educational materials, and care plan.

## 2022-10-11 NOTE — Telephone Encounter (Signed)
Patient would be a good candidate for omnipod insulin pump Would like to see [facility] -- Rayetta Pigg, NP is the one that does pumps there Patient aware this is a process & will take time for referral  Thank you!

## 2022-10-12 ENCOUNTER — Inpatient Hospital Stay: Payer: PPO | Admitting: Family Medicine

## 2022-10-12 NOTE — Telephone Encounter (Signed)
Referral placed.

## 2022-10-14 NOTE — Progress Notes (Unsigned)
Cardiology Office Note   Date:  10/17/2022   ID:  [name], DOB 1951-01-02, MRN [id number]  PCP:  Gwenlyn Perking, FNP  Cardiologist:   Minus Breeding, MD Referring:  Gwenlyn Perking, FNP    Chief Complaint  Patient presents with   Chest Pain     History of Present Illness: [name] is a 72 y.o. female who presents for follow up of CAD.  I have not seen her since 2018.   She has a known history of stents to the LAD and PTCA to diagonal in 2008.    She had non obstructive CAD in 2015.  She was admitted with slurred speech in 2016 and had a negative work up with an unremarkable echo and mild carotid plaque.  POET (Plain Old Exercise Treadmill) was intermediate risk.   I last saw her in 2018.  She had atypical chest pain at that time.  She had a follow-up perfusion study that suggest some possible mild ischemia in the basal inferolateral, mid inferolateral, apical inferior and apical lateral locations.   She had a cath at that time and had patent stents as below.   She was in the hospital in July 2023.  She had unstable angina with restenosis of the LAD proximal to the previously placed stent.  She had stenting of this.  Since I last saw her she had unstable angina again and was in the hospital earlier this month.  She had PCI of an LAD in stent restenosis.  I reviewed these records for this visit.    Anatomy is as below.    She said that the day she presented she was having increasing chest discomfort that had been progressing.  Since she got home she had to take 1 sublingual nitroglycerin when she was up trying to walk.  She said it is not at the level where she would present to the hospital.  She has been pretty good about her symptoms as an indicator.  She has not had any new shortness of breath, PND or orthopnea.  She has not had any new palpitations, presyncope or syncope.  Past Medical History:  Diagnosis Date   Anxiety    CAD (coronary artery disease)    DES to  circumflex 02/2007, BMS to LAD and PTCA diagonal 03/2007   Carotid artery plaque    Mild   Cataract    Depression    Diverticulitis, colon    Elevated d-dimer 01/08/2014   Essential hypertension, benign    GERD (gastroesophageal reflux disease)    H/O hiatal hernia    HLD (hyperlipidemia)    IDDM (insulin dependent diabetes mellitus)    Migraine    "used to have them really bad; don't have them anymore" (01/07/2014)   MS (multiple sclerosis) (HCC)    Not confirmed   PAT (paroxysmal atrial tachycardia)    Prolapse of uterus    PVD (peripheral vascular disease) (HCC)    TIA (transient ischemic attack) 1980's    Past Surgical History:  Procedure Laterality Date   ABDOMINAL HYSTERECTOMY  1986   ovaries remain - prolaspe uterus    APPENDECTOMY  ~ 1970   BREAST BIOPSY Right 1980's   BREAST LUMPECTOMY Right 1980's   Dr. Charlynne Pander    CARDIAC CATHETERIZATION  01/07/2014   CHOLECYSTECTOMY  ?1987   COLONOSCOPY  2002   Dr. Anwar--> Severe diverticular changes in the region of the sigmoid and descending colon with scattered  diverticular changes throughout the rest of the colon. No polyps, ulcerations. Despite numerous manipulations, the tip of the scope could not be tipped into the cecal area.   COLONOSCOPY  01/10/2012   Procedure: COLONOSCOPY;  Surgeon: Daneil Dolin, MD;  [facility]: [facility];  Service: Endoscopy;  Laterality: N/A;  1:55   CORONARY ANGIOPLASTY WITH STENT PLACEMENT  ~ 1997 X 2   "2 + 1"   CORONARY BALLOON ANGIOPLASTY N/A 10/05/2022   Procedure: CORONARY BALLOON ANGIOPLASTY;  Surgeon: Sherren Mocha, MD;  Location: [place] [facility];  Service: Cardiovascular;  Laterality: N/A;   CORONARY STENT INTERVENTION N/A 03/26/2022   Procedure: CORONARY STENT INTERVENTION;  Surgeon: Lorretta Harp, MD;  Location: [place] [facility];  Service: Cardiovascular;  Laterality: N/A;   EYE SURGERY Bilateral 2014   cataract   INTRAVASCULAR PRESSURE WIRE/FFR STUDY N/A 03/08/2017    Procedure: Intravascular Pressure Wire/FFR Study;  Surgeon: Nelva Bush, MD;  Location: [place] [facility];  Service: Cardiovascular;  Laterality: N/A;   LEFT HEART CATH AND CORONARY ANGIOGRAPHY N/A 03/08/2017   Procedure: Left Heart Cath and Coronary Angiography;  Surgeon: Nelva Bush, MD;  Location: [place] [facility];  Service: Cardiovascular;  Laterality: N/A;   LEFT HEART CATH AND CORONARY ANGIOGRAPHY N/A 03/26/2022   Procedure: LEFT HEART CATH AND CORONARY ANGIOGRAPHY;  Surgeon: Lorretta Harp, MD;  Location: [place] [facility];  Service: Cardiovascular;  Laterality: N/A;   LEFT HEART CATH AND CORONARY ANGIOGRAPHY N/A 10/05/2022   Procedure: LEFT HEART CATH AND CORONARY ANGIOGRAPHY;  Surgeon: Sherren Mocha, MD;  Location: [place] [facility];  Service: Cardiovascular;  Laterality: N/A;   LEFT HEART CATHETERIZATION WITH CORONARY ANGIOGRAM N/A 01/07/2014   Procedure: LEFT HEART CATHETERIZATION WITH CORONARY ANGIOGRAM;  Surgeon: Larey Dresser, MD;  Location: [facility] CATH LAB;  Service: Cardiovascular;  Laterality: N/A;     Current Outpatient Medications  Medication Sig Dispense Refill   amLODipine (NORVASC) 10 MG tablet Take 1 tablet (10 mg total) by mouth daily. 90 tablet 3   aspirin 81 MG chewable tablet Chew 1 tablet (81 mg total) by mouth daily. 90 tablet 3   atenolol (TENORMIN) 50 MG tablet Take 1 tablet (50 mg total) by mouth 2 (two) times daily. 180 tablet 3   clopidogrel (PLAVIX) 75 MG tablet Take 1 tablet (75 mg total) by mouth daily with breakfast. 90 tablet 3   furosemide (LASIX) 40 MG tablet Take 1 tablet (40 mg total) by mouth daily. 90 tablet 3   insulin aspart (NOVOLOG FLEXPEN) 100 UNIT/ML FlexPen Inject 36 Units into the skin 3 (three) times daily with meals.     insulin degludec (TRESIBA FLEXTOUCH) 100 UNIT/ML FlexTouch Pen Inject 60 Units into the skin daily.     Iron Combinations (IRON COMPLEX PO) Take by mouth daily.     isosorbide mononitrate (IMDUR) 120 MG 24 hr  tablet Take 1 tablet (120 mg total) by mouth daily. 90 tablet 3   nitroGLYCERIN (NITROSTAT) 0.4 MG SL tablet DISSOLVE 1 TABLET UNDER TONGUE FOR CHESTPAIN.MAY REPEAT EVERY 5 MINUTES FOR 3 DOSES.IF NO RELIEF CALL 911 OR GO TO ER 25 tablet 3   potassium chloride (KLOR-CON M) 10 MEQ tablet Take 1 tablet (10 mEq total) by mouth 2 (two) times daily. (Patient taking differently: Take 10 mEq by mouth daily.) 80 tablet 1   rOPINIRole (REQUIP) 0.5 MG tablet Take 2 tablets (1 mg total) by mouth daily as needed (restless leg).  rosuvastatin (CRESTOR) 20 MG tablet TAKE ONE (1) TABLET BY MOUTH EVERY DAY 90 tablet 0   Semaglutide, 1 MG/DOSE, (OZEMPIC, 1 MG/DOSE,) 2 MG/1.5ML SOPN Inject 1 mg into the skin once a week.     sertraline (ZOLOFT) 100 MG tablet Take 1 tablet (100 mg total) by mouth daily. 90 tablet 3   traZODone (DESYREL) 100 MG tablet Take 1 tablet (100 mg total) by mouth at bedtime as needed for sleep. 90 tablet 1   Continuous Blood Gluc Receiver (FREESTYLE LIBRE 2 READER) DEVI USE TO TEST BLOOD SUGAR 6 TIMES DAILY. Dx:E11.65 1 each 0   Continuous Blood Gluc Sensor (FREESTYLE LIBRE 2 SENSOR) MISC USE TO TEST BLOOD SUGAR 6 TIMES DAILY 2 each 11   No current facility-administered medications for this visit.    Allergies:   Iohexol, Ticlid [ticlopidine hcl], Jardiance [empagliflozin], Metformin and related, and Codeine    ROS:  Please see the history of present illness.   Otherwise, review of systems are positive for none.   All other systems are reviewed and negative.    PHYSICAL EXAM: VS:  BP (!) 152/70   Pulse 76   Ht '5\' 6"'$  (1.676 m)   Wt 166 lb (75.3 kg)   BMI 26.79 kg/m  , BMI Body mass index is 26.79 kg/m. GENERAL:  Well appearing NECK:  No jugular venous distention, waveform within normal limits, carotid upstroke brisk and symmetric, no bruits, no thyromegaly LUNGS:  Clear to auscultation bilaterally CHEST:  Unremarkable HEART:  PMI not displaced or sustained,S1 and S2 within  normal limits, no S3, no S4, no clicks, no rubs, no murmurs ABD:  Flat, positive bowel sounds normal in frequency in pitch, no bruits, no rebound, no guarding, no midline pulsatile mass, no hepatomegaly, no splenomegaly EXT:  2 plus pulses throughout, no edema, no cyanosis no clubbing, right radial without bleeding but with slight ecchymosis resolving   EKG:  EKG is  ordered today. The ekg ordered  demonstrates sinus rhythm, rate 76 low voltage in the limb leads, poor anterior R wave progression, no acute ST-T wave changes.  No change from previous.  The QTC is mildly prolonged   Recent Labs: 12/11/2021: TSH 2.110 03/27/2022: Magnesium 1.9 09/20/2022: ALT 6 10/06/2022: BUN 16; Creatinine, Ser 0.96; Hemoglobin 9.6; Platelets 145; Potassium 4.0; Sodium 135    Lipid Panel    Component Value Date/Time   CHOL 124 09/20/2022 0835   CHOL 107 02/05/2013 1002   TRIG 90 09/20/2022 0835   TRIG 149 11/30/2015 0947   TRIG 143 02/05/2013 1002   HDL 43 09/20/2022 0835   HDL 35 (L) 11/30/2015 0947   HDL 35 (L) 02/05/2013 1002   CHOLHDL 2.9 09/20/2022 0835   CHOLHDL 2.9 03/27/2022 0537   VLDL 22 03/27/2022 0537   LDLCALC 64 09/20/2022 0835   LDLCALC 195 (H) 04/26/2014 1029   LDLCALC 43 02/05/2013 1002   LDLDIRECT 93 04/06/2015 1038      Wt Readings from Last 3 Encounters:  10/17/22 166 lb (75.3 kg)  10/04/22 160 lb (72.6 kg)  09/20/22 163 lb (73.9 kg)     Cardiac cath:  Jan 2024.   Diagnostic Dominance: Right  Diagnostic Dominance: Right  Intervention      Other studies Reviewed: Additional studies/ records that were reviewed today include: Hospital records.  Review of the above records demonstrates:  Please see elsewhere in the note.     ASSESSMENT AND PLAN:   CAD: She is status post PCI earlier this  month again.  She needs to have better blood sugar control.  Her blood pressure is controlled at home.  Her lipids are okay.  I am getting increase her Imdur to 120 mg daily.    HTN:  Her blood pressure is at target.  No change in therapy.   CAROTID STENOSIS:   This was mild.  No change in therapy.   HYPERLIPIDEMIA:     Her lipids are at target and she will continue the meds as listed.    DM:  A1C was up to 8.5 from 7.7.  Her primary providers had wanted to increase her Ozempic but she had to hold off on this.  I told her the need to do this and sent a message to the primary team.    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  No change in therapy.  Current medicines are reviewed at length with the patient today.  The patient does not have concerns regarding medicines.  The following changes have been made: As above  Labs/ tests ordered today include:  None  Orders Placed This Encounter  Procedures   EKG 12-Lead    Disposition:   FU with me 1 months.      Signed, Minus Breeding, MD  10/17/2022 11:08 AM    [place]

## 2022-10-17 ENCOUNTER — Encounter: Payer: Self-pay | Admitting: Cardiology

## 2022-10-17 ENCOUNTER — Ambulatory Visit: Payer: PPO | Admitting: Cardiology

## 2022-10-17 VITALS — BP 152/70 | HR 76 | Ht 66.0 in | Wt 166.0 lb

## 2022-10-17 DIAGNOSIS — I10 Essential (primary) hypertension: Secondary | ICD-10-CM

## 2022-10-17 DIAGNOSIS — R5383 Other fatigue: Secondary | ICD-10-CM | POA: Diagnosis not present

## 2022-10-17 DIAGNOSIS — E785 Hyperlipidemia, unspecified: Secondary | ICD-10-CM

## 2022-10-17 DIAGNOSIS — E118 Type 2 diabetes mellitus with unspecified complications: Secondary | ICD-10-CM | POA: Diagnosis not present

## 2022-10-17 DIAGNOSIS — I25110 Atherosclerotic heart disease of native coronary artery with unstable angina pectoris: Secondary | ICD-10-CM

## 2022-10-17 MED ORDER — ISOSORBIDE MONONITRATE ER 120 MG PO TB24: 120.0000 mg | ORAL_TABLET | Freq: Every day | ORAL | 3 refills | Status: DC

## 2022-10-17 NOTE — Patient Instructions (Signed)
Medication Instructions:  Please increase your Isosorbide to 120 mg once daily. Continue all other medications as listed.  *If you need a refill on your cardiac medications before your next appointment, please call your pharmacy*  Follow-Up: At [facility], you and your health needs are our priority.  As part of our continuing mission to provide you with exceptional heart care, we have created designated Provider Care Teams.  These Care Teams include your primary Cardiologist (physician) and Advanced Practice Providers (APPs -  Physician Assistants and Nurse Practitioners) who all work together to provide you with the care you need, when you need it.  We recommend signing up for the patient portal called "MyChart".  Sign up information is provided on this After Visit Summary.  MyChart is used to connect with patients for Virtual Visits (Telemedicine).  Patients are able to view lab/test results, encounter notes, upcoming appointments, etc.  Non-urgent messages can be sent to your provider as well.   To learn more about what you can do with MyChart, go to [email or web address].    Your next appointment:   1 month(s)  Provider:   Minus Breeding, MD

## 2022-10-23 DIAGNOSIS — L570 Actinic keratosis: Secondary | ICD-10-CM | POA: Diagnosis not present

## 2022-10-24 ENCOUNTER — Telehealth: Payer: Self-pay | Admitting: Family Medicine

## 2022-10-24 DIAGNOSIS — E1165 Type 2 diabetes mellitus with hyperglycemia: Secondary | ICD-10-CM

## 2022-10-24 DIAGNOSIS — N1831 Chronic kidney disease, stage 3a: Secondary | ICD-10-CM

## 2022-10-24 DIAGNOSIS — Z794 Long term (current) use of insulin: Secondary | ICD-10-CM

## 2022-10-28 ENCOUNTER — Other Ambulatory Visit: Payer: Self-pay | Admitting: Family Medicine

## 2022-10-28 DIAGNOSIS — E1169 Type 2 diabetes mellitus with other specified complication: Secondary | ICD-10-CM

## 2022-10-29 NOTE — Telephone Encounter (Signed)
Received notification from [place] regarding approval for OZEMPIC '1MG'$  DOSE, [place], AND TRESIBA. Patient assistance approved until 09/24/23  Phone: [phone]

## 2022-10-30 ENCOUNTER — Ambulatory Visit: Payer: PPO | Admitting: Gastroenterology

## 2022-11-12 ENCOUNTER — Telehealth: Payer: Self-pay | Admitting: Family Medicine

## 2022-11-13 ENCOUNTER — Other Ambulatory Visit: Payer: Self-pay | Admitting: Family Medicine

## 2022-11-13 ENCOUNTER — Ambulatory Visit (INDEPENDENT_AMBULATORY_CARE_PROVIDER_SITE_OTHER): Payer: PPO | Admitting: Pharmacist

## 2022-11-13 DIAGNOSIS — E1165 Type 2 diabetes mellitus with hyperglycemia: Secondary | ICD-10-CM

## 2022-11-13 DIAGNOSIS — N1831 Chronic kidney disease, stage 3a: Secondary | ICD-10-CM

## 2022-11-13 NOTE — Progress Notes (Signed)
11/13/2022 Name: [name] MRN: [id number] DOB: Aug 21, 1951  Chief Complaint  Patient presents with   Chronic Care Management   Diabetes    [name] is a 72 y.o. year old female who presented for a telephone visit.   They were referred to the pharmacist by their PCP for assistance in managing diabetes.   Patient was recently referred to [facility]. When contacted by the endocrinology office, patient said she would like a call back in a month or so to schedule. Recommended patient return their call and schedule appointment.   Subjective:  Care Team: Primary Care Provider: Gwenlyn Perking, FNP ; Next Scheduled Visit: 12/20/2022 Cardiologist: Dr. Percival Spanish; Next Scheduled Visit: 11/21/2022  Medication Access/Adherence  Current Pharmacy:  [facility], [place] [address] Phone: [phone] Fax: [phone]    Diabetes:  Current medications: Tresiba 36 units daily, Novolog 30-40 units with meals, Ozempic 1 mg (has not yet received 2 mg dose through PAP)  Medications tried in the past: intolerant to metformin and Jardiance   Patient has difficulty relaying information on CGM.  FBG yesterday: 250 mg/dL  Average glucose: 170 mg/dL  Hypoglycemia last night 63-67 mg/dL. Corrected with Hersey's bar.   Patient reports hypoglycemic s/sx including dizziness, shakiness, sweating. Patient denies hyperglycemic symptoms including polyuria, polydipsia, polyphagia, nocturia, neuropathy, blurred vision.  Reported home BP readings: 135/80   Objective:  Lab Results  Component Value Date   HGBA1C 8.5 (H) 10/05/2022    Lab Results  Component Value Date   CREATININE 0.96 10/06/2022   BUN 16 10/06/2022   NA 135 10/06/2022   K 4.0 10/06/2022   CL 102 10/06/2022   CO2 24 10/06/2022    Lab Results  Component Value Date   CHOL 124 09/20/2022   HDL 43 09/20/2022   LDLCALC 64 09/20/2022   LDLDIRECT  93 04/06/2015   TRIG 90 09/20/2022   CHOLHDL 2.9 09/20/2022    Medications Reviewed Today     Reviewed by Pauletta Browns, [facility] (Pharmacist) on 11/13/22 at [place] List Status: <None>   Medication Order Taking? Sig Documenting Provider Last Dose Status Informant  amLODipine (NORVASC) 10 MG tablet [id number] No Take 1 tablet (10 mg total) by mouth daily. Mendel Corning, MD Taking Active Self, Pharmacy Records  aspirin 81 MG chewable tablet [id number] No Chew 1 tablet (81 mg total) by mouth daily. Mendel Corning, MD Taking Active Self, Pharmacy Records  atenolol (TENORMIN) 50 MG tablet [id number] No Take 1 tablet (50 mg total) by mouth 2 (two) times daily. Gwenlyn Perking, FNP Taking Active Self, Pharmacy Records  clopidogrel (PLAVIX) 75 MG tablet [id number] No Take 1 tablet (75 mg total) by mouth daily with breakfast. Mendel Corning, MD Taking Active Self, Pharmacy Records  Continuous Blood Gluc Receiver (FREESTYLE LIBRE 2 READER) DEVI [id number] No USE TO TEST BLOOD SUGAR 6 TIMES DAILY. Dx:E11.65 Gwenlyn Perking, FNP Taking Active Self, Pharmacy Records  Continuous Blood Gluc Sensor (FREESTYLE LIBRE 2 SENSOR) [place] [id number] No USE TO TEST BLOOD SUGAR 6 TIMES DAILY Gwenlyn Perking, FNP Taking Active Self, Pharmacy Records  furosemide (LASIX) 40 MG tablet [id number] No Take 1 tablet (40 mg total) by mouth daily. Gwenlyn Perking, FNP Taking Active Self, Pharmacy Records  insulin aspart (NOVOLOG FLEXPEN) 100 UNIT/ML FlexPen [id number] No Inject 36 Units into the skin 3 (three) times daily with meals. Geradine Girt,  DO Taking Active            Med Note Lollie Marrow, Dawnn Nam S   Tue Nov 13, 2022  9:16 AM) Dewaine Conger as 30-40 units BID  insulin degludec (TRESIBA FLEXTOUCH) 100 UNIT/ML FlexTouch Pen [id number] No Inject 60 Units into the skin daily. [provider] Taking Active Self, Pharmacy Records           Med Note Lollie Marrow, Kanasia Gayman S   Tue Nov 13, 2022  9:17 AM) Per pt, takes 36 units   Iron Combinations (IRON COMPLEX PO) [id number] No Take by mouth daily. [provider] Taking Active   isosorbide mononitrate (IMDUR) 120 MG 24 hr tablet [id number]  Take 1 tablet (120 mg total) by mouth daily. Minus Breeding, MD  Active   nitroGLYCERIN (NITROSTAT) 0.4 MG SL tablet [id number] No DISSOLVE 1 TABLET UNDER TONGUE FOR CHESTPAIN.MAY REPEAT EVERY 5 MINUTES FOR 3 DOSES.IF NO RELIEF CALL 911 OR GO TO ER Rai, Vernelle Emerald, MD Taking Active Self, Pharmacy Records  potassium chloride (KLOR-CON M) 10 MEQ tablet [id number] No Take 1 tablet (10 mEq total) by mouth 2 (two) times daily.  Patient taking differently: Take 10 mEq by mouth daily.   Gwenlyn Perking, FNP Taking Active Self, Pharmacy Records  rOPINIRole (REQUIP) 0.5 MG tablet [id number] No Take 2 tablets (1 mg total) by mouth daily as needed (restless leg). Geradine Girt, DO Taking Active   rosuvastatin (CRESTOR) 20 MG tablet [id number]  TAKE ONE (1) TABLET BY MOUTH EVERY DAY Gwenlyn Perking, FNP  Active   Semaglutide, 1 MG/DOSE, (OZEMPIC, 1 MG/DOSE,) 2 MG/1.5ML SOPN [id number] No Inject 1 mg into the skin once a week. [provider] Taking Active Self, Pharmacy Records           Med Note Scotty Court   Mon Mar 26, 2022  8:37 AM) On thursdays  sertraline (ZOLOFT) 100 MG tablet [id number] No Take 1 tablet (100 mg total) by mouth daily. Gwenlyn Perking, FNP Taking Active Self, Pharmacy Records  traZODone (DESYREL) 100 MG tablet [id number] No Take 1 tablet (100 mg total) by mouth at bedtime as needed for sleep. Gwenlyn Perking, FNP Taking Active Self, Pharmacy Records  Med List Note Gwenlyn Perking, [facility] 08/09/21 1015): nov             Assessment/Plan:   Diabetes: - Currently uncontrolled based on A1c. Patient has somewhat erratic blood sugar and has difficulty providing CGM data over the phone. Encouraged patient to call & schedule appointment with endocrinology, provided office number. -Continue current  treatment.   -Discussed with patient that it will take 4-6 weeks to receive a dose change through Ozempic patient assistance  - Reviewed goal A1c, goal fasting, and goal 2 hour post prandial glucose - Recommend to continue Ozempic 2 mg weekly. She can inject two of the 1 mg injections. The 2 mg pen has not yet arrived to patient's house which will take 4-6 weeks as she receives this through PAP.   Follow Up Plan:  Pharmacist: 3 months PCP: March 2024  Joseph Art, Sherian Rein.D. PGY-2 Ambulatory Care Pharmacy Resident 11/13/2022 10:25 AM

## 2022-11-13 NOTE — Telephone Encounter (Signed)
Returned call We have already submitted paperwork for increased dose Will be in 2-4 weeks

## 2022-11-16 ENCOUNTER — Telehealth: Payer: Self-pay | Admitting: Pharmacist

## 2022-11-16 NOTE — Telephone Encounter (Signed)
Ozempic increased to '2mg'$  weekly Counseling provided

## 2022-11-18 NOTE — Progress Notes (Unsigned)
Cardiology Office Note   Date:  11/21/2022   ID:  [name], DOB [birth year], MRN [id number]  PCP:  Gwenlyn Perking, FNP  Cardiologist:   Minus Breeding, MD Referring:  Gwenlyn Perking, FNP    Chief Complaint  Patient presents with   Coronary Artery Disease     History of Present Illness: [name] is a 72 y.o. female who presents for follow up of CAD.  I have not seen her since 2018.   She has a known history of stents to the LAD and PTCA to diagonal in 2008.    She had non obstructive CAD in 2015.  She was admitted with slurred speech in 2016 and had a negative work up with an unremarkable echo and mild carotid plaque.  POET (Plain Old Exercise Treadmill) was intermediate risk.   I last saw her in 2018.  She had atypical chest pain at that time.  She had a follow-up perfusion study that suggest some possible mild ischemia in the basal inferolateral, mid inferolateral, apical inferior and apical lateral locations.   She had a cath at that time and had patent stents as below.   She was in the hospital in July 2023.  She had unstable angina with restenosis of the LAD proximal to the previously placed stent.  She had stenting of this.  She was in the hospital again in Jan 2024 .  She had unstable angina again and was in the hosital.  She had PCI of an LAD in stent restenosis.    She presents for follow up.  She is having some dizziness when she first stands up but she otherwise is doing well.  She is not getting any angina she was getting.  She denies any chest pressure, neck or arm discomfort.  She has had no new shortness of breath, PND or orthopnea.  She walks with her neighbor.   Past Medical History:  Diagnosis Date   Anxiety    CAD (coronary artery disease)    DES to circumflex 02/2007, BMS to LAD and PTCA diagonal 03/2007   Carotid artery plaque    Mild   Cataract    Depression    Diverticulitis, colon    Elevated d-dimer 01/08/2014   Essential hypertension,  benign    GERD (gastroesophageal reflux disease)    H/O hiatal hernia    HLD (hyperlipidemia)    IDDM (insulin dependent diabetes mellitus)    Migraine    "used to have them really bad; don't have them anymore" (01/07/2014)   MS (multiple sclerosis) (HCC)    Not confirmed   PAT (paroxysmal atrial tachycardia)    Prolapse of uterus    PVD (peripheral vascular disease) (HCC)    TIA (transient ischemic attack) 1980's    Past Surgical History:  Procedure Laterality Date   ABDOMINAL HYSTERECTOMY  1986   ovaries remain - prolaspe uterus    APPENDECTOMY  ~ 1970   BREAST BIOPSY Right 1980's   BREAST LUMPECTOMY Right 1980's   Dr. Charlynne Pander    CARDIAC CATHETERIZATION  01/07/2014   CHOLECYSTECTOMY  ?1987   COLONOSCOPY  2002   Dr. Anwar--> Severe diverticular changes in the region of the sigmoid and descending colon with scattered diverticular changes throughout the rest of the colon. No polyps, ulcerations. Despite numerous manipulations, the tip of the scope could not be tipped into the cecal area.   COLONOSCOPY  01/10/2012   Procedure: COLONOSCOPY;  Surgeon:  Daneil Dolin, MD;  [facility]: [facility];  Service: Endoscopy;  Laterality: N/A;  1:55   CORONARY ANGIOPLASTY WITH STENT PLACEMENT  ~ 1997 X 2   "2 + 1"   CORONARY BALLOON ANGIOPLASTY N/A 10/05/2022   Procedure: CORONARY BALLOON ANGIOPLASTY;  Surgeon: Sherren Mocha, MD;  Location: [place] [facility];  Service: Cardiovascular;  Laterality: N/A;   CORONARY STENT INTERVENTION N/A 03/26/2022   Procedure: CORONARY STENT INTERVENTION;  Surgeon: Lorretta Harp, MD;  Location: [place] [facility] LAB;  Service: Cardiovascular;  Laterality: N/A;   EYE SURGERY Bilateral 2014   cataract   INTRAVASCULAR PRESSURE WIRE/FFR STUDY N/A 03/08/2017   Procedure: Intravascular Pressure Wire/FFR Study;  Surgeon: Nelva Bush, MD;  Location: [place] [facility];  Service: Cardiovascular;  Laterality: N/A;   LEFT HEART CATH AND CORONARY ANGIOGRAPHY N/A  03/08/2017   Procedure: Left Heart Cath and Coronary Angiography;  Surgeon: Nelva Bush, MD;  Location: [place] [facility];  Service: Cardiovascular;  Laterality: N/A;   LEFT HEART CATH AND CORONARY ANGIOGRAPHY N/A 03/26/2022   Procedure: LEFT HEART CATH AND CORONARY ANGIOGRAPHY;  Surgeon: Lorretta Harp, MD;  Location: [place] [facility];  Service: Cardiovascular;  Laterality: N/A;   LEFT HEART CATH AND CORONARY ANGIOGRAPHY N/A 10/05/2022   Procedure: LEFT HEART CATH AND CORONARY ANGIOGRAPHY;  Surgeon: Sherren Mocha, MD;  Location: [place] CV LAB;  Service: Cardiovascular;  Laterality: N/A;   LEFT HEART CATHETERIZATION WITH CORONARY ANGIOGRAM N/A 01/07/2014   Procedure: LEFT HEART CATHETERIZATION WITH CORONARY ANGIOGRAM;  Surgeon: Larey Dresser, MD;  Location: [facility] CATH LAB;  Service: Cardiovascular;  Laterality: N/A;     Current Outpatient Medications  Medication Sig Dispense Refill   amLODipine (NORVASC) 10 MG tablet Take 1 tablet (10 mg total) by mouth daily. 90 tablet 3   aspirin 81 MG chewable tablet Chew 1 tablet (81 mg total) by mouth daily. 90 tablet 3   atenolol (TENORMIN) 50 MG tablet Take 1 tablet (50 mg total) by mouth 2 (two) times daily. 180 tablet 3   clopidogrel (PLAVIX) 75 MG tablet Take 1 tablet (75 mg total) by mouth daily with breakfast. 90 tablet 3   furosemide (LASIX) 40 MG tablet Take 1 tablet (40 mg total) by mouth daily. 90 tablet 3   insulin aspart (NOVOLOG FLEXPEN) 100 UNIT/ML FlexPen Inject 36 Units into the skin 3 (three) times daily with meals.     insulin degludec (TRESIBA FLEXTOUCH) 100 UNIT/ML FlexTouch Pen Inject 60 Units into the skin daily.     Iron Combinations (IRON COMPLEX PO) Take by mouth daily.     isosorbide mononitrate (IMDUR) 120 MG 24 hr tablet Take 1 tablet (120 mg total) by mouth daily. 90 tablet 3   nitroGLYCERIN (NITROSTAT) 0.4 MG SL tablet DISSOLVE 1 TABLET UNDER TONGUE FOR CHESTPAIN.MAY REPEAT EVERY 5 MINUTES FOR 3 DOSES.IF NO RELIEF  CALL 911 OR GO TO ER 25 tablet 3   potassium chloride (KLOR-CON M) 10 MEQ tablet Take 1 tablet (10 mEq total) by mouth 2 (two) times daily. (Patient taking differently: Take 10 mEq by mouth daily.) 80 tablet 1   rOPINIRole (REQUIP) 0.5 MG tablet Take 2 tablets (1 mg total) by mouth daily as needed (restless leg).     rosuvastatin (CRESTOR) 20 MG tablet TAKE ONE (1) TABLET BY MOUTH EVERY DAY 90 tablet 0   Semaglutide, 1 MG/DOSE, (OZEMPIC, 1 MG/DOSE,) 2 MG/1.5ML SOPN Inject 2 mg into the skin once a week.  sertraline (ZOLOFT) 100 MG tablet Take 1 tablet (100 mg total) by mouth daily. 90 tablet 3   traZODone (DESYREL) 100 MG tablet Take 1 tablet (100 mg total) by mouth at bedtime as needed for sleep. 90 tablet 1   Continuous Blood Gluc Receiver (FREESTYLE LIBRE 2 READER) DEVI USE TO TEST BLOOD SUGAR 6 TIMES DAILY. Dx:E11.65 1 each 0   Continuous Blood Gluc Sensor (FREESTYLE LIBRE 2 SENSOR) MISC USE TO TEST BLOOD SUGAR 6 TIMES DAILY 2 each 11   lisinopril (ZESTRIL) 10 MG tablet Take 1 tablet (10 mg total) by mouth daily. 90 tablet 3   No current facility-administered medications for this visit.    Allergies:   Iohexol, Ticlid [ticlopidine hcl], Jardiance [empagliflozin], Metformin and related, and Codeine    ROS:  Please see the history of present illness.   Otherwise, review of systems are positive for none.   All other systems are reviewed and negative.    PHYSICAL EXAM: VS:  BP 124/70   Pulse 84   Ht '5\' 6"'$  (1.676 m)   Wt 160 lb (72.6 kg)   BMI 25.82 kg/m  , BMI Body mass index is 25.82 kg/m. GENERAL:  Well appearing NECK:  No jugular venous distention, waveform within normal limits, carotid upstroke brisk and symmetric, no bruits, no thyromegaly LUNGS:  Clear to auscultation bilaterally CHEST:  Unremarkable HEART:  PMI not displaced or sustained,S1 and S2 within normal limits, no S3, no S4, no clicks, no rubs, no murmurs ABD:  Flat, positive bowel sounds normal in frequency in  pitch, no bruits, no rebound, no guarding, no midline pulsatile mass, no hepatomegaly, no splenomegaly EXT:  2 plus pulses throughout, no edema, no cyanosis no clubbing   EKG:  EKG is not ordered today.    Recent Labs: 12/11/2021: TSH 2.110 03/27/2022: Magnesium 1.9 09/20/2022: ALT 6 10/06/2022: BUN 16; Creatinine, Ser 0.96; Hemoglobin 9.6; Platelets 145; Potassium 4.0; Sodium 135    Lipid Panel    Component Value Date/Time   CHOL 124 09/20/2022 0835   CHOL 107 02/05/2013 1002   TRIG 90 09/20/2022 0835   TRIG 149 11/30/2015 0947   TRIG 143 02/05/2013 1002   HDL 43 09/20/2022 0835   HDL 35 (L) 11/30/2015 0947   HDL 35 (L) 02/05/2013 1002   CHOLHDL 2.9 09/20/2022 0835   CHOLHDL 2.9 03/27/2022 0537   VLDL 22 03/27/2022 0537   LDLCALC 64 09/20/2022 0835   LDLCALC 195 (H) 04/26/2014 1029   LDLCALC 43 02/05/2013 1002   LDLDIRECT 93 04/06/2015 1038      Wt Readings from Last 3 Encounters:  11/21/22 160 lb (72.6 kg)  10/17/22 166 lb (75.3 kg)  10/04/22 160 lb (72.6 kg)     Cardiac cath:  Jan 2024.   Diagnostic Dominance: Right  Diagnostic Dominance: Right  Intervention      Other studies Reviewed: Additional studies/ records that were reviewed today include: Labs  Review of the above records demonstrates:  Please see elsewhere in the note.     ASSESSMENT AND PLAN:   CAD:   The patient has no new sypmtoms.  No further cardiovascular testing is indicated.  We will continue with aggressive risk reduction and meds as listed.  HTN:  Her blood pressure is at target.  No change in therapy.   CAROTID STENOSIS:    This has been mild.  No further imaging.  HYPERLIPIDEMIA:     Her LDL was at 65.  No change in therapy.  DM:  A1C was not controlled and she just had her Ozempic increased.  I will defer to her primary provider.   PROLONGED QT:     QT is mildly prolonged and she avoids QT prolonging medications.   Current medicines are reviewed at length with the  patient today.  The patient does not have concerns regarding medicines.  The following changes have been made: None  Labs/ tests ordered today include: None  No orders of the defined types were placed in this encounter.   Disposition:   FU with me in January.    Signed, Minus Breeding, MD  11/21/2022 9:34 AM    [facility]

## 2022-11-19 DIAGNOSIS — H40033 Anatomical narrow angle, bilateral: Secondary | ICD-10-CM | POA: Diagnosis not present

## 2022-11-19 DIAGNOSIS — H2513 Age-related nuclear cataract, bilateral: Secondary | ICD-10-CM | POA: Diagnosis not present

## 2022-11-21 ENCOUNTER — Ambulatory Visit (INDEPENDENT_AMBULATORY_CARE_PROVIDER_SITE_OTHER): Payer: PPO | Admitting: Cardiology

## 2022-11-21 ENCOUNTER — Encounter: Payer: Self-pay | Admitting: Cardiology

## 2022-11-21 VITALS — BP 124/70 | HR 84 | Ht 66.0 in | Wt 160.0 lb

## 2022-11-21 DIAGNOSIS — E785 Hyperlipidemia, unspecified: Secondary | ICD-10-CM

## 2022-11-21 DIAGNOSIS — I2510 Atherosclerotic heart disease of native coronary artery without angina pectoris: Secondary | ICD-10-CM | POA: Diagnosis not present

## 2022-11-21 DIAGNOSIS — R9431 Abnormal electrocardiogram [ECG] [EKG]: Secondary | ICD-10-CM

## 2022-11-21 DIAGNOSIS — E118 Type 2 diabetes mellitus with unspecified complications: Secondary | ICD-10-CM

## 2022-11-21 MED ORDER — LISINOPRIL 10 MG PO TABS: 10.0000 mg | ORAL_TABLET | Freq: Every day | ORAL | 3 refills | Status: DC

## 2022-11-21 MED ORDER — LISINOPRIL 10 MG PO TABS: 10.0000 mg | ORAL_TABLET | Freq: Every day | ORAL | 3 refills | Status: AC

## 2022-11-21 NOTE — Patient Instructions (Signed)
Medication Instructions:  The current medical regimen is effective;  continue present plan and medications.  *If you need a refill on your cardiac medications before your next appointment, please call your pharmacy*   Follow-Up: At [facility], you and your health needs are our priority.  As part of our continuing mission to provide you with exceptional heart care, we have created designated Provider Care Teams.  These Care Teams include your primary Cardiologist (physician) and Advanced Practice Providers (APPs -  Physician Assistants and Nurse Practitioners) who all work together to provide you with the care you need, when you need it.  We recommend signing up for the patient portal called "MyChart".  Sign up information is provided on this After Visit Summary.  MyChart is used to connect with patients for Virtual Visits (Telemedicine).  Patients are able to view lab/test results, encounter notes, upcoming appointments, etc.  Non-urgent messages can be sent to your provider as well.   To learn more about what you can do with MyChart, go to [email or web address].    Your next appointment:    (due January 2025) 10 month(s)  Provider:   Minus Breeding, MD

## 2022-11-21 NOTE — Addendum Note (Signed)
Addended by: Shellia Cleverly on: 11/21/2022 01:15 PM   Modules accepted: Orders

## 2022-11-22 DIAGNOSIS — N1831 Chronic kidney disease, stage 3a: Secondary | ICD-10-CM

## 2022-11-22 DIAGNOSIS — Z794 Long term (current) use of insulin: Secondary | ICD-10-CM

## 2022-11-22 DIAGNOSIS — E1165 Type 2 diabetes mellitus with hyperglycemia: Secondary | ICD-10-CM

## 2022-11-30 ENCOUNTER — Ambulatory Visit (INDEPENDENT_AMBULATORY_CARE_PROVIDER_SITE_OTHER): Payer: PPO | Admitting: Pharmacist

## 2022-11-30 VITALS — BP 126/78 | HR 65

## 2022-11-30 DIAGNOSIS — Z794 Long term (current) use of insulin: Secondary | ICD-10-CM

## 2022-11-30 DIAGNOSIS — E1169 Type 2 diabetes mellitus with other specified complication: Secondary | ICD-10-CM

## 2022-11-30 MED ORDER — FREESTYLE LIBRE 3 SENSOR MISC: 5 refills | Status: DC

## 2022-11-30 MED ORDER — FREESTYLE LIBRE 3 READER DEVI: 1.0000 | Freq: Once | 0 refills | Status: AC

## 2022-11-30 NOTE — Progress Notes (Signed)
Chronic Care Management Pharmacy Note  11/30/2022 Name:  [name] MRN:  [id number] DOB:  12-10-1950  Summary:  Diabetes: Goal on track: NO. Uncontrolled-A1c 7.4-->9.4% variable BG readings; FBG okay/post prandials high GFR 45-->80-->59 CKD 3a Reports FBG <150, postprandials 200s Past intolerances: metformin, farxiga (SGLT2s) Current treatment: Tresiba 60 units, Novolog 20 units 2x w/ meals (only eats 2 meals daily), Ozempic '1mg'$  weekly;  PLAN: Continue Tresiba 50-60 units every morning, Novolog 20-30 units with meals (2x daily, only eats 2 meals), INCREASE TO Ozempic '2mg'$  weekly Post prandials higher--instructed patient to take insulin 65mn before eating-->must cover meals Avg 2 meals/day--must be consistent  Denies personal and family history of Medullary thyroid cancer (MTC) Refills sent for patient assistance with novo nordisk 2024 (tresiba, novolog, ozempic) Patient uses [employer]2 CGM currently --> attempting to switch to L[employer]3 CGM (HTA medicare okay to send to local drug store) Reports hyper/hypoglycemia (previously had) Discussed meal planning options and Plate method for healthy eating Avoid sugary drinks and desserts Incorporate balanced protein, non starchy veggies, 1 serving of carbohydrate with each meal Increase water intake Increase physical activity as able Discussed FOLLOWING A HEART HEALTHY DIET/HEALTHY PLATE METHOD Lipids currently controlled on rosuvastatin '40mg'$  Lipid Panel     Component Value Date/Time   CHOL 124 09/20/2022 0835   CHOL 107 02/05/2013 1002   TRIG 90 09/20/2022 0835   TRIG 149 11/30/2015 0947   TRIG 143 02/05/2013 1002   HDL 43 09/20/2022 0835   HDL 35 (L) 11/30/2015 0947   HDL 35 (L) 02/05/2013 1002   CHOLHDL 2.9 09/20/2022 0835   CHOLHDL 2.9 03/27/2022 0537   VLDL 22 03/27/2022 0537   LDLCALC 64 09/20/2022 0835   LDLCALC 195 (H) 04/26/2014 1029   LDLCALC 43 02/05/2013 1002   LDLDIRECT 93 04/06/2015 1038   LABVLDL 17 09/20/2022 0835    Current exercise: n/a  Patient Goals/Self-Care Activities patient will:  - take medications as prescribed as evidenced by patient report and record review check glucose using libre 3 CGM, document, and provide at future appointments collaborate with provider on medication access solutions   Subjective: Karen GERMANOis an 72y.o. year old female who is a primary patient of MGwenlyn Perking FNP.  The patient was referred to the Chronic Care Management team for assistance with care management needs subsequent to provider initiation of CCM services and plan of care.    Engaged with patient by telephone for follow up visit in response to provider referral for CCM services.   Objective:  LABS:   Lab Results  Component Value Date   CREATININE 0.96 10/06/2022   CREATININE 0.95 10/06/2022   CREATININE 0.95 10/05/2022   CREATININE 1.00 10/05/2022     Lab Results  Component Value Date   HGBA1C 8.5 (H) 10/05/2022         Component Value Date/Time   CHOL 124 09/20/2022 0835   CHOL 107 02/05/2013 1002   TRIG 90 09/20/2022 0835   TRIG 149 11/30/2015 0947   TRIG 143 02/05/2013 1002   HDL 43 09/20/2022 0835   HDL 35 (L) 11/30/2015 0947   HDL 35 (L) 02/05/2013 1002   CHOLHDL 2.9 09/20/2022 0835   CHOLHDL 2.9 03/27/2022 0537   VLDL 22 03/27/2022 0537   LDLCALC 64 09/20/2022 0835   LDLCALC 195 (H) 04/26/2014 1029   LDLCALC 43 02/05/2013 1002   LDLDIRECT 93 04/06/2015 1038     Clinical ASCVD: Yes   The ASCVD Risk score (  Arnett DK, et al., 2019) failed to calculate for the following reasons:   The patient has a prior MI or stroke diagnosis    Other: (CHADS2VASc if Afib, PHQ9 if depression, MMRC or CAT for COPD, ACT, DEXA)    BP Readings from Last 3 Encounters:  11/30/22 126/78  11/21/22 124/70  10/17/22 (!) 152/70      SDOH:  (Social Determinants of Health) assessments and interventions performed:    Allergies  Allergen Reactions   Iohexol      Desc: pt had  syncopal episode with nausea post IV CM late 1990's,  pt has had prednisone prep with heart caths x 2 without problem  kdean 04/16/07, Onset Date: [id number]    Ticlid [Ticlopidine Hcl] Nausea And Vomiting   Jardiance [Empagliflozin] Other (See Comments)    Recurrent UTIs   Metformin And Related Diarrhea   Codeine Nausea And Vomiting and Palpitations    Medications Reviewed Today     Reviewed by Minus Breeding, MD (Physician) on 11/21/22 at [facility] List Status: <None>   Medication Order Taking? Sig Documenting Provider Last Dose Status Informant  amLODipine (NORVASC) 10 MG tablet [id number] Yes Take 1 tablet (10 mg total) by mouth daily. Mendel Corning, MD Taking Active Self, Pharmacy Records  aspirin 81 MG chewable tablet [id number] Yes Chew 1 tablet (81 mg total) by mouth daily. Mendel Corning, MD Taking Active Self, Pharmacy Records  atenolol (TENORMIN) 50 MG tablet [id number] Yes Take 1 tablet (50 mg total) by mouth 2 (two) times daily. Gwenlyn Perking, FNP Taking Active Self, Pharmacy Records  clopidogrel (PLAVIX) 75 MG tablet [id number] Yes Take 1 tablet (75 mg total) by mouth daily with breakfast. Rai, Vernelle Emerald, MD Taking Active Self, Pharmacy Records  Continuous Blood Gluc Receiver (FREESTYLE LIBRE 2 READER) DEVI [id number]  USE TO TEST BLOOD SUGAR 6 TIMES DAILY. Dx:E11.65 Gwenlyn Perking, FNP  Active Self, Pharmacy Records  Continuous Blood Gluc Sensor (FREESTYLE LIBRE 2 SENSOR) [place] [id number]  USE TO TEST BLOOD SUGAR 6 TIMES DAILY Gwenlyn Perking, FNP  Active Self, Pharmacy Records  furosemide (LASIX) 40 MG tablet [id number] Yes Take 1 tablet (40 mg total) by mouth daily. Gwenlyn Perking, FNP Taking Active Self, Pharmacy Records  insulin aspart (NOVOLOG FLEXPEN) 100 UNIT/ML FlexPen [id number] Yes Inject 36 Units into the skin 3 (three) times daily with meals. Geradine Girt, DO Taking Active            Med Note Lollie Marrow, RACHEL S   Tue Nov 13, 2022  9:16 AM) Dewaine Conger as 30-40 units  BID  insulin degludec (TRESIBA FLEXTOUCH) 100 UNIT/ML FlexTouch Pen [id number] Yes Inject 60 Units into the skin daily. [provider] Taking Active Self, Pharmacy Records           Med Note Lollie Marrow, RACHEL S   Tue Nov 13, 2022  9:17 AM) Per pt, takes 36 units  Iron Combinations (IRON COMPLEX PO) [id number] Yes Take by mouth daily. [provider] Taking Active   isosorbide mononitrate (IMDUR) 120 MG 24 hr tablet [id number] Yes Take 1 tablet (120 mg total) by mouth daily. Minus Breeding, MD Taking Active   lisinopril (ZESTRIL) 10 MG tablet [id number]  Take 1 tablet (10 mg total) by mouth daily. Minus Breeding, MD  Active   nitroGLYCERIN (NITROSTAT) 0.4 MG SL tablet [id number] Yes DISSOLVE 1 TABLET UNDER TONGUE FOR CHESTPAIN.MAY REPEAT EVERY 5 MINUTES FOR 3 DOSES.IF NO RELIEF CALL 911 OR GO TO ER Rai,  Ripudeep K, MD Taking Active Self, Pharmacy Records  potassium chloride (KLOR-CON M) 10 MEQ tablet [id number] Yes Take 1 tablet (10 mEq total) by mouth 2 (two) times daily.  Patient taking differently: Take 10 mEq by mouth daily.   Gwenlyn Perking, FNP Taking Active Self, Pharmacy Records  rOPINIRole (REQUIP) 0.5 MG tablet [id number] Yes Take 2 tablets (1 mg total) by mouth daily as needed (restless leg). Geradine Girt, DO Taking Active   rosuvastatin (CRESTOR) 20 MG tablet [id number] Yes TAKE ONE (1) TABLET BY MOUTH EVERY DAY Gwenlyn Perking, FNP Taking Active   Semaglutide, 1 MG/DOSE, (OZEMPIC, 1 MG/DOSE,) 2 MG/1.5ML SOPN [id number] Yes Inject 2 mg into the skin once a week. [provider] Taking Active Self, Pharmacy Records           Med Note [address]   Mon Mar 26, 2022  8:37 AM) On thursdays  sertraline (ZOLOFT) 100 MG tablet [id number] Yes Take 1 tablet (100 mg total) by mouth daily. Gwenlyn Perking, FNP Taking Active Self, Pharmacy Records  traZODone (DESYREL) 100 MG tablet [id number] Yes Take 1 tablet (100 mg total) by mouth at bedtime as needed for sleep.  Gwenlyn Perking, FNP Taking Active Self, Pharmacy Records  Med List Note Gwenlyn Perking, [facility] [place] 08/09/21 1015): nov              Goals Addressed               This Visit's Progress     Patient Stated     T2DM PHARMD (pt-stated)        Current Barriers:  Unable to independently afford treatment regimen Unable to achieve control of T2DM, CKD3A, HLD  Unable to maintain control of T2DM  Pharmacist Clinical Goal(s):  patient will verbalize ability to afford treatment regimen achieve control of t2dm as evidenced by goal a1c<7% through collaboration with PharmD and provider.   Interventions: 1:1 collaboration with Gwenlyn Perking, FNP regarding development and update of comprehensive plan of care as evidenced by provider attestation and co-signature Inter-disciplinary care team collaboration (see longitudinal plan of care) Comprehensive medication review performed; medication list updated in electronic medical record  Diabetes: Goal on track: NO. Uncontrolled-A1c 7.4-->9.4% variable BG readings; FBG okay/post prandials high GFR 45-->80-->59 CKD 3a Reports FBG <150, postprandials 200s Past intolerances: metformin, farxiga (SGLT2s) Current treatment: Tresiba 60 units, Novolog 20 units 2x w/ meals (only eats 2 meals daily), Ozempic '2mg'$  weekly;  PLAN: Continue Tresiba 50-60 units every morning, Novolog 20-30 units with meals (2x daily, only eats 2 meals), INCREASE TO Ozempic '2mg'$  weekly Post prandials higher--instructed patient to take insulin 63mn before eating-->must cover meals Avg 2 meals/day--must be consistent  Denies personal and family history of Medullary thyroid cancer (MTC) Refills sent for patient assistance with novo nordisk 2024 (tresiba, novolog, ozempic) Patient uses [employer] CGM currently --> attempting to switch to [employer]3 CGM (HTA medicare okay to send to local drug store) Reports hyper/hypoglycemia (previously had) Discussed meal planning options and Plate  method for healthy eating Avoid sugary drinks and desserts Incorporate balanced protein, non starchy veggies, 1 serving of carbohydrate with each meal Increase water intake Increase physical activity as able Discussed FOLLOWING A HEART HEALTHY DIET/HEALTHY PLATE METHOD Lipids currently controlled on rosuvastatin '40mg'$  Lipid Panel     Component Value Date/Time   CHOL 124 09/20/2022 0835   CHOL 107 02/05/2013 1002   TRIG 90 09/20/2022 0835   TRIG 149 11/30/2015 0947  TRIG 143 02/05/2013 1002   HDL 43 09/20/2022 0835   HDL 35 (L) 11/30/2015 0947   HDL 35 (L) 02/05/2013 1002   CHOLHDL 2.9 09/20/2022 0835   CHOLHDL 2.9 03/27/2022 0537   VLDL 22 03/27/2022 0537   LDLCALC 64 09/20/2022 0835   LDLCALC 195 (H) 04/26/2014 1029   LDLCALC 43 02/05/2013 1002   LDLDIRECT 93 04/06/2015 1038   LABVLDL 17 09/20/2022 0835   Current exercise: n/a  Patient Goals/Self-Care Activities patient will:  - take medications as prescribed as evidenced by patient report and record review check glucose using libre 3 CGM, document, and provide at future appointments collaborate with provider on medication access solutions         Plan: Telephone follow up appointment with care management team member scheduled for:  3 months    Regina Eck, PharmD, [place] Clinical Pharmacist, [facility]

## 2022-12-11 ENCOUNTER — Encounter: Payer: Self-pay | Admitting: Nurse Practitioner

## 2022-12-20 ENCOUNTER — Ambulatory Visit: Payer: PPO | Admitting: Family Medicine

## 2022-12-23 DIAGNOSIS — Z794 Long term (current) use of insulin: Secondary | ICD-10-CM

## 2022-12-23 DIAGNOSIS — E785 Hyperlipidemia, unspecified: Secondary | ICD-10-CM

## 2022-12-23 DIAGNOSIS — E1169 Type 2 diabetes mellitus with other specified complication: Secondary | ICD-10-CM

## 2022-12-25 ENCOUNTER — Telehealth: Payer: Self-pay | Admitting: Family Medicine

## 2022-12-25 NOTE — Telephone Encounter (Signed)
Please review

## 2022-12-26 ENCOUNTER — Ambulatory Visit: Payer: PPO | Admitting: Nurse Practitioner

## 2022-12-27 NOTE — Telephone Encounter (Signed)
Informed patient her [name] and Novolog shipments will process 01/06/23, and the Ozempic will process 02/04/23. Patient thankful for follow up phone call.

## 2022-12-31 ENCOUNTER — Encounter: Payer: Self-pay | Admitting: Nurse Practitioner

## 2023-01-14 DIAGNOSIS — N390 Urinary tract infection, site not specified: Secondary | ICD-10-CM | POA: Diagnosis not present

## 2023-01-22 ENCOUNTER — Other Ambulatory Visit: Payer: Self-pay | Admitting: Pharmacist

## 2023-01-22 ENCOUNTER — Telehealth: Payer: Self-pay | Admitting: Pharmacist

## 2023-01-22 NOTE — Progress Notes (Signed)
   Pharmacy Note   01/22/2023 Name:  [name]           MRN:  [id number]      DOB:  02-26-1951   Summary:   Diabetes: Goal on track: NO. Uncontrolled-A1c 9.4-->8.5% variable BG readings; FBG running higher up to 200/post prandials up to 200 GFR 59 CKD 3a Patient cancelled endocrine appt due to cost; she is unable to afford specialist copay and would not be able to incur additional charges (pump, etc) Past intolerances: metformin, farxiga (SGLT2s) Current treatment: Tresiba 60 units, Novolog 20 units 2x w/ meals (only eats 2 meals daily), Ozempic 12mg  weekly;  PLAN: Continue Tresiba 50-60 units every morning, Novolog 20-30 units with meals (2x daily, only eats 2 meals), Ozempic 2mg  weekly Denies personal and family history of Medullary thyroid cancer (MTC) Refills arrived at PCP office today for [place] , Novolog and Ozempic 1mg  (needs to be 2mg ; message send to sent for patient assistance with novo nordisk 2024 (tresiba, novolog, ozempic) Now using [place] 3 CGM (HTA medicare okay to send to local drug store) Reports hyper/hypoglycemia (previously had) Discussed meal planning options and Plate method for healthy eating Avoid sugary drinks and desserts Incorporate balanced protein, non starchy veggies, 1 serving of carbohydrate with each meal Increase water intake Increase physical activity as able Discussed FOLLOWING A HEART HEALTHY DIET/HEALTHY PLATE METHOD Lipids currently controlled on rosuvastatin 40mg  Lipid Panel          Component Value Date/Time    CHOL 124 09/20/2022 0835    CHOL 107 02/05/2013 1002    TRIG 90 09/20/2022 0835    TRIG 149 11/30/2015 0947    TRIG 143 02/05/2013 1002    HDL 43 09/20/2022 0835    HDL 35 (L) 11/30/2015 0947    HDL 35 (L) 02/05/2013 1002    CHOLHDL 2.9 09/20/2022 0835    CHOLHDL 2.9 03/27/2022 0537    VLDL 22 03/27/2022 0537    LDLCALC 64 09/20/2022 0835    LDLCALC 195 (H) 04/26/2014 1029    LDLCALC 43 02/05/2013 1002    LDLDIRECT 93  04/06/2015 1038    LABVLDL 17 09/20/2022 0835    Current exercise: n/a   Patient Goals/Self-Care Activities patient will:  - take medications as prescribed as evidenced by patient report and record review check glucose using libre 3 CGM, document, and provide at future appointments collaborate with provider on medication access solutions  Kieth Brightly, PharmD, BCACP Clinical Pharmacist, [facility]

## 2023-01-22 NOTE — Telephone Encounter (Signed)
Novo Nordisk PAP Ozempic 1mg  weekly arrived to PCP Office today #4 boxes Patient should be on Ozempic 2mg  weekly (will instruct patient to double up injections if we can go ahead and have Ozempic 2mg  ordered/processed) Can we make sure dose change is submitted  Send to me if needed  Thank you!

## 2023-01-24 ENCOUNTER — Ambulatory Visit (INDEPENDENT_AMBULATORY_CARE_PROVIDER_SITE_OTHER): Payer: PPO

## 2023-01-24 VITALS — Ht 65.0 in | Wt 152.0 lb

## 2023-01-24 DIAGNOSIS — Z0000 Encounter for general adult medical examination without abnormal findings: Secondary | ICD-10-CM

## 2023-01-24 NOTE — Progress Notes (Signed)
Subjective:   [name] is a 72 y.o. female who presents for Medicare Annual (Subsequent) preventive examination. I connected with  Renato Gails on 01/24/23 by a audio enabled telemedicine application and verified that I am speaking with the correct person using two identifiers.  Patient Location: Home  Provider Location: Home Office  I discussed the limitations of evaluation and management by telemedicine. The patient expressed understanding and agreed to proceed.  Review of Systems     Cardiac Risk Factors include: advanced age (>14men, >35 women);diabetes mellitus;hypertension     Objective:    Today's Vitals   01/24/23 1457  Weight: 152 lb (68.9 kg)  Height: 5\' 5"  (1.651 m)   Body mass index is 25.29 kg/m.     01/24/2023    3:01 PM 10/04/2022   11:30 PM 03/26/2022    5:00 AM 01/22/2022    3:38 PM 01/20/2021    9:07 AM 11/15/2020    7:23 PM 10/10/2020   10:28 PM  Advanced Directives  Does Patient Have a Medical Advance Directive? No No Yes Yes No No No  Type of Advance Directive   Living will Healthcare Power of [place];Living will     Does patient want to make changes to medical advance directive?   No - Patient declined      Copy of Healthcare Power of Attorney in Chart?    No - copy requested     Would patient like information on creating a medical advance directive? No - Patient declined    No - Patient declined No - Patient declined No - Patient declined    Current Medications (verified) Outpatient Encounter Medications as of 01/24/2023  Medication Sig   amLODipine (NORVASC) 10 MG tablet Take 1 tablet (10 mg total) by mouth daily.   aspirin 81 MG chewable tablet Chew 1 tablet (81 mg total) by mouth daily.   atenolol (TENORMIN) 50 MG tablet Take 1 tablet (50 mg total) by mouth 2 (two) times daily.   clopidogrel (PLAVIX) 75 MG tablet Take 1 tablet (75 mg total) by mouth daily with breakfast.   Continuous Blood Gluc Sensor (FREESTYLE LIBRE 3 SENSOR) MISC Place 1  sensor on the skin every 14 days. Use to check glucose continuously. DX:e11.65   furosemide (LASIX) 40 MG tablet Take 1 tablet (40 mg total) by mouth daily.   insulin aspart (NOVOLOG FLEXPEN) 100 UNIT/ML FlexPen Inject 36 Units into the skin 3 (three) times daily with meals.   insulin degludec (TRESIBA FLEXTOUCH) 100 UNIT/ML FlexTouch Pen Inject 60 Units into the skin daily.   Iron Combinations (IRON COMPLEX PO) Take by mouth daily.   isosorbide mononitrate (IMDUR) 120 MG 24 hr tablet Take 1 tablet (120 mg total) by mouth daily.   lisinopril (ZESTRIL) 10 MG tablet Take 1 tablet (10 mg total) by mouth daily.   nitroGLYCERIN (NITROSTAT) 0.4 MG SL tablet DISSOLVE 1 TABLET UNDER TONGUE FOR CHESTPAIN.MAY REPEAT EVERY 5 MINUTES FOR 3 DOSES.IF NO RELIEF CALL 911 OR GO TO ER   potassium chloride (KLOR-CON M) 10 MEQ tablet Take 1 tablet (10 mEq total) by mouth 2 (two) times daily. (Patient taking differently: Take 10 mEq by mouth daily.)   rOPINIRole (REQUIP) 0.5 MG tablet Take 2 tablets (1 mg total) by mouth daily as needed (restless leg).   rosuvastatin (CRESTOR) 20 MG tablet TAKE ONE (1) TABLET BY MOUTH EVERY DAY   Semaglutide, 1 MG/DOSE, (OZEMPIC, 1 MG/DOSE,) 2 MG/1.5ML SOPN Inject 2 mg into the skin  once a week.   sertraline (ZOLOFT) 100 MG tablet Take 1 tablet (100 mg total) by mouth daily.   traZODone (DESYREL) 100 MG tablet Take 1 tablet (100 mg total) by mouth at bedtime as needed for sleep.   No facility-administered encounter medications on file as of 01/24/2023.    Allergies (verified) Iohexol, Ticlid [ticlopidine hcl], Jardiance [empagliflozin], Metformin and related, and Codeine   History: Past Medical History:  Diagnosis Date   Anxiety    CAD (coronary artery disease)    DES to circumflex 02/2007, BMS to LAD and PTCA diagonal 03/2007   Carotid artery plaque    Mild   Cataract    Depression    Diverticulitis, colon    Elevated d-dimer 01/08/2014   Essential hypertension, benign     GERD (gastroesophageal reflux disease)    H/O hiatal hernia    HLD (hyperlipidemia)    IDDM (insulin dependent diabetes mellitus)    Migraine    "used to have them really bad; don't have them anymore" (01/07/2014)   MS (multiple sclerosis) (HCC)    Not confirmed   PAT (paroxysmal atrial tachycardia)    Prolapse of uterus    PVD (peripheral vascular disease) (HCC)    TIA (transient ischemic attack) 1980's   Past Surgical History:  Procedure Laterality Date   ABDOMINAL HYSTERECTOMY  1986   ovaries remain - prolaspe uterus    APPENDECTOMY  ~ 1970   BREAST BIOPSY Right 1980's   BREAST LUMPECTOMY Right 1980's   Dr. Luan Moore    CARDIAC CATHETERIZATION  01/07/2014   CHOLECYSTECTOMY  ?1987   COLONOSCOPY  2002   Dr. Anwar--> Severe diverticular changes in the region of the sigmoid and descending colon with scattered diverticular changes throughout the rest of the colon. No polyps, ulcerations. Despite numerous manipulations, the tip of the scope could not be tipped into the cecal area.   COLONOSCOPY  01/10/2012   Procedure: COLONOSCOPY;  Surgeon: Corbin Ade, MD;  Location: [facility];  Service: Endoscopy;  Laterality: N/A;  1:55   CORONARY ANGIOPLASTY WITH STENT PLACEMENT  ~ 1997 X 2   "2 + 1"   CORONARY BALLOON ANGIOPLASTY N/A 10/05/2022   Procedure: CORONARY BALLOON ANGIOPLASTY;  Surgeon: Tonny Bollman, MD;  Location: [facility] INVASIVE CV LAB;  Service: Cardiovascular;  Laterality: N/A;   CORONARY PRESSURE/FFR STUDY N/A 03/08/2017   Procedure: Intravascular Pressure Wire/FFR Study;  Surgeon: Yvonne Kendall, MD;  Location: [facility] INVASIVE CV LAB;  Service: Cardiovascular;  Laterality: N/A;   CORONARY STENT INTERVENTION N/A 03/26/2022   Procedure: CORONARY STENT INTERVENTION;  Surgeon: Runell Gess, MD;  Location: [facility] INVASIVE CV LAB;  Service: Cardiovascular;  Laterality: N/A;   EYE SURGERY Bilateral 2014   cataract   LEFT HEART CATH AND CORONARY ANGIOGRAPHY N/A 03/08/2017   Procedure:  Left Heart Cath and Coronary Angiography;  Surgeon: Yvonne Kendall, MD;  Location: [facility] INVASIVE CV LAB;  Service: Cardiovascular;  Laterality: N/A;   LEFT HEART CATH AND CORONARY ANGIOGRAPHY N/A 03/26/2022   Procedure: LEFT HEART CATH AND CORONARY ANGIOGRAPHY;  Surgeon: Runell Gess, MD;  Location: [facility] INVASIVE CV LAB;  Service: Cardiovascular;  Laterality: N/A;   LEFT HEART CATH AND CORONARY ANGIOGRAPHY N/A 10/05/2022   Procedure: LEFT HEART CATH AND CORONARY ANGIOGRAPHY;  Surgeon: Tonny Bollman, MD;  Location: [facility] INVASIVE CV LAB;  Service: Cardiovascular;  Laterality: N/A;   LEFT HEART CATHETERIZATION WITH CORONARY ANGIOGRAM N/A 01/07/2014   Procedure: LEFT HEART CATHETERIZATION WITH CORONARY ANGIOGRAM;  Surgeon: Laurey Morale, MD;  Location: [facility] CATH LAB;  Service: Cardiovascular;  Laterality: N/A;   Family History  Problem Relation Age of Onset   Heart attack Mother 69   Diabetes Mother    Hypertension Mother    Heart attack Father 41   Heart attack Brother 32       x 6   Heart disease Brother    Diabetes Brother    Colon cancer Paternal Aunt        24s, died with brain anuerysm   Crohn's disease Cousin        paternal   Diabetes Sister    GER disease Daughter    Cervical cancer Daughter    Diabetes Daughter    Social History   Socioeconomic History   Marital status: Widowed    Spouse name: Not on file   Number of children: 4   Years of education: 16   Highest education level: 11th grade  Occupational History   Occupation: Disability    Employer: DISABLED  Tobacco Use   Smoking status: Never   Smokeless tobacco: Never   Tobacco comments:    spouse, 41 years - husband has quit 01/2011  Vaping Use   Vaping Use: Never used  Substance and Sexual Activity   Alcohol use: No   Drug use: No   Sexual activity: Not Currently  Other Topics Concern   Not on file  Social History Narrative   Lives alone, one level, handicap accessible bathroom, her children all live  nearby   Social Determinants of Health   Financial Resource Strain: Low Risk  (01/24/2023)   Overall Financial Resource Strain (CARDIA)    Difficulty of Paying Living Expenses: Not hard at all  Food Insecurity: No Food Insecurity (01/24/2023)   Hunger Vital Sign    Worried About Running Out of Food in the Last Year: Never true    Ran Out of Food in the Last Year: Never true  Transportation Needs: No Transportation Needs (01/24/2023)   PRAPARE - Administrator, Civil Service (Medical): No    Lack of Transportation (Non-Medical): No  Physical Activity: Insufficiently Active (01/24/2023)   Exercise Vital Sign    Days of Exercise per Week: 3 days    Minutes of Exercise per Session: 30 min  Stress: No Stress Concern Present (01/24/2023)   [employer] of Occupational Health - Occupational Stress Questionnaire    Feeling of Stress : Not at all  Social Connections: Moderately Integrated (01/24/2023)   Social Connection and Isolation Panel [NHANES]    Frequency of Communication with Friends and Family: More than three times a week    Frequency of Social Gatherings with Friends and Family: More than three times a week    Attends Religious Services: More than 4 times per year    Active Member of [employer] or Organizations: No    Attends Banker Meetings: Never    Marital Status: Married    Tobacco Counseling Counseling given: Not Answered Tobacco comments: spouse, 41 years - husband has quit 01/2011   Clinical Intake:  Pre-visit preparation completed: Yes  Pain : No/denies pain     Nutritional Risks: None Diabetes: Yes CBG done?: No Did pt. bring in CBG monitor from home?: No  How often do you need to have someone help you when you read instructions, pamphlets, or other written materials from your doctor or pharmacy?: 1 - Never  Diabetic?yes  Nutrition Risk Assessment:  Has the  patient had any N/V/D within the last 2 months?  No  Does the patient have any  non-healing wounds?  No  Has the patient had any unintentional weight loss or weight gain?  No   Diabetes:  Is the patient diabetic?  Yes  If diabetic, was a CBG obtained today?  No  Did the patient bring in their glucometer from home?  No  How often do you monitor your CBG's? LIBRE.   Financial Strains and Diabetes Management:  Are you having any financial strains with the device, your supplies or your medication? No .  Does the patient want to be seen by Chronic Care Management for management of their diabetes?  No  Would the patient like to be referred to a Nutritionist or for Diabetic Management?  No   Diabetic Exams:  Diabetic Eye Exam: Completed 10/2022 Diabetic Foot Exam: Overdue, Pt has been advised about the importance in completing this exam. Pt is scheduled for diabetic foot exam on next office visit .    Interpreter Needed?: No  Information entered by :: Renie Ora, LPN   Activities of Daily Living    01/24/2023    3:01 PM 10/05/2022    5:00 PM  In your present state of health, do you have any difficulty performing the following activities:  Hearing? 0 0  Vision? 0 0  Difficulty concentrating or making decisions? 0 0  Walking or climbing stairs? 0 0  Dressing or bathing? 0 0  Doing errands, shopping? 0 0  Preparing Food and eating ? N   Using the Toilet? N   In the past six months, have you accidently leaked urine? N   Do you have problems with loss of bowel control? N   Managing your Medications? N   Managing your Finances? N   Housekeeping or managing your Housekeeping? N     Patient Care Team: Gabriel Earing, FNP as PCP - General (Family Medicine) Rollene Rotunda, MD as PCP - Cardiology (Cardiology) Jena Gauss Gerrit Friends, MD (Gastroenterology) Rollene Rotunda, MD (Cardiology) Michaelle Copas, MD as Referring Physician (Optometry) Cresenciano Genre, Lilla Shook, [facility] as Pharmacist (Family Medicine) Marcelino Duster, MD as Referring Physician  (Dermatology)  Indicate any recent Medical Services you may have received from other than [facility] providers in the past year (date may be approximate).     Assessment:   This is a routine wellness examination for Allen.  Hearing/Vision screen Vision Screening - Comments:: Wears rx glasses - up to date with routine eye exams with  Dr,Lee   Dietary issues and exercise activities discussed: Current Exercise Habits: Home exercise routine, Type of exercise: walking, Time (Minutes): 30, Frequency (Times/Week): 3, Weekly Exercise (Minutes/Week): 90, Intensity: Mild, Exercise limited by: orthopedic condition(s)   Goals Addressed             This Visit's Progress    DIET - INCREASE WATER INTAKE         Depression Screen    01/24/2023    3:00 PM 09/20/2022    8:28 AM 06/20/2022    8:17 AM 05/07/2022    8:02 AM 04/12/2022    9:26 AM 04/06/2022   10:31 AM 03/12/2022    7:58 AM  PHQ 2/9 Scores  PHQ - 2 Score 0 2 3 4 6 6 2   PHQ- 9 Score  8 10 13 18 21 6     Fall Risk    01/24/2023    2:59 PM 06/20/2022    8:17 AM 05/07/2022  8:01 AM 04/12/2022    9:26 AM 04/06/2022   10:31 AM  Fall Risk   Falls in the past year? 0 0 1 1 1   Number falls in past yr: 0  1 1 0  Injury with Fall? 0  1 1 0  Risk for fall due to : No Fall Risks  History of fall(s) History of fall(s) History of fall(s)  Follow up Falls prevention discussed  Falls evaluation completed Falls evaluation completed Education provided    FALL RISK PREVENTION PERTAINING TO THE HOME:  Any stairs in or around the home? No  If so, are there any without handrails? No  Home free of loose throw rugs in walkways, pet beds, electrical cords, etc? Yes  Adequate lighting in your home to reduce risk of falls? Yes   ASSISTIVE DEVICES UTILIZED TO PREVENT FALLS:  Life alert? No  Use of a cane, walker or w/c? Yes  Grab bars in the bathroom? Yes  Shower chair or bench in shower? Yes  Elevated toilet seat or a handicapped toilet? Yes        12/18/2017    3:05 PM 06/21/2016    2:44 PM 04/06/2015   11:03 AM  MMSE - Mini Mental State Exam  Orientation to time 5 5 5   Orientation to Place 5 5 5   Registration 3 3 3   Attention/ Calculation 4 5 5   Recall 2 3 3   Language- name 2 objects 2 2 2   Language- repeat 1 1 1   Language- follow 3 step command 3 3 3   Language- read & follow direction 1 1 1   Write a sentence 0 1 1  Copy design 1 0 1  Total score 27 29 30         01/24/2023    3:02 PM 01/22/2022    3:51 PM 01/20/2020    8:50 AM 01/19/2019   12:03 PM  6CIT Screen  What Year? 0 points 0 points 0 points 0 points  What month? 0 points 0 points 0 points 0 points  What time? 0 points 0 points 0 points 0 points  Count back from 20 0 points 0 points 0 points 0 points  Months in reverse 0 points 0 points 0 points 0 points  Repeat phrase 0 points 4 points 0 points 2 points  Total Score 0 points 4 points 0 points 2 points    Immunizations Immunization History  Administered Date(s) Administered   Fluad Quad(high Dose 65+) 06/17/2019, 07/09/2020, 06/09/2021, 06/20/2022   Influenza, High Dose Seasonal PF 06/19/2017, 06/18/2018   Influenza,inj,Quad PF,6+ Mos 06/28/2015   Influenza-Unspecified 05/25/2014, 06/15/2016   Moderna Sars-Covid-2 Vaccination 11/19/2019, 12/18/2019   Pneumococcal Conjugate-13 02/08/2014   Pneumococcal Polysaccharide-23 06/28/2015   Tdap 11/22/2010, 12/11/2021   Zoster Recombinat (Shingrix) 12/11/2021, 03/12/2022    TDAP status: Up to date  Flu Vaccine status: Up to date  Pneumococcal vaccine status: Up to date  Covid-19 vaccine status: Completed vaccines  Qualifies for Shingles Vaccine? Yes   Zostavax completed Yes   Shingrix Completed?: Yes  Screening Tests Health Maintenance  Topic Date Due   COVID-19 Vaccine (3 - 2023-24 season) 05/25/2022   COLONOSCOPY (Pts 45-28yrs Insurance coverage will need to be confirmed)  04/07/2023 (Originally 01/09/2022)   DEXA SCAN  09/15/2023 (Originally  07/14/2022)   FOOT EXAM  03/13/2023   HEMOGLOBIN A1C  04/05/2023   INFLUENZA VACCINE  04/25/2023   OPHTHALMOLOGY EXAM  04/25/2023   MAMMOGRAM  08/28/2023   Diabetic kidney evaluation - Urine  ACR  09/21/2023   Diabetic kidney evaluation - eGFR measurement  10/07/2023   Medicare Annual Wellness (AWV)  01/24/2024   DTaP/Tdap/Td (3 - Td or Tdap) 12/12/2031   Pneumonia Vaccine 65+ Years old  Completed   Hepatitis C Screening  Completed   Zoster Vaccines- Shingrix  Completed   HPV VACCINES  Aged Out   COLON CANCER SCREENING ANNUAL FOBT  Discontinued    Health Maintenance  Health Maintenance Due  Topic Date Due   COVID-19 Vaccine (3 - 2023-24 season) 05/25/2022    Colorectal cancer screening: Referral to GI placed declined . Pt aware the office will call re: appt.  Mammogram status: Completed 08/27/2022. Repeat every year  Bone Density status: Ordered declined . Pt provided with contact info and advised to call to schedule appt.  Lung Cancer Screening: (Low Dose CT Chest recommended if Age 36-80 years, 30 pack-year currently smoking OR have quit w/in 15years.) does not qualify.   Lung Cancer Screening Referral: n/a  Additional Screening:  Hepatitis C Screening: does not qualify; Completed 06/21/2016  Vision Screening: Recommended annual ophthalmology exams for early detection of glaucoma and other disorders of the eye. Is the patient up to date with their annual eye exam?  Yes  Who is the provider or what is the name of the office in which the patient attends annual eye exams? Dr.Lee  If pt is not established with a provider, would they like to be referred to a provider to establish care? No .   Dental Screening: Recommended annual dental exams for proper oral hygiene  Community Resource Referral / Chronic Care Management: CRR required this visit?  No   CCM required this visit?  No      Plan:     I have personally reviewed and noted the following in the patient's  chart:   Medical and social history Use of alcohol, tobacco or illicit drugs  Current medications and supplements including opioid prescriptions. Patient is not currently taking opioid prescriptions. Functional ability and status Nutritional status Physical activity Advanced directives List of other physicians Hospitalizations, surgeries, and ER visits in previous 12 months Vitals Screenings to include cognitive, depression, and falls Referrals and appointments  In addition, I have reviewed and discussed with patient certain preventive protocols, quality metrics, and best practice recommendations. A written personalized care plan for preventive services as well as general preventive health recommendations were provided to patient.     Lorrene Reid, LPN   09/29/1094   Nurse Notes: none

## 2023-01-24 NOTE — Patient Instructions (Signed)
[name] , Thank you for taking time to come for your Medicare Wellness Visit. I appreciate your ongoing commitment to your health goals. Please review the following plan we discussed and let me know if I can assist you in the future.   These are the goals we discussed:  Goals       awv      01/20/2020 AWV Goal: Fall Prevention  Over the next year, patient will decrease their risk for falls by: Using assistive devices, such as a cane or walker, as needed Identifying fall risks within their home and correcting them by: Removing throw rugs Adding handrails to stairs or ramps Removing clutter and keeping a clear pathway throughout the home Increasing light, especially at night Adding shower handles/bars Raising toilet seat Identifying potential personal risk factors for falls: Medication side effects Incontinence/urgency Vestibular dysfunction Hearing loss Musculoskeletal disorders Neurological disorders Orthostatic hypotension  01/20/2020 AWV Goal: Diabetes Management  Patient will maintain an A1C level below 8.0 Patient will not develop any diabetic foot complications Patient will not experience any hypoglycemic episodes over the next 3 months Patient will notify our office of any CBG readings outside of the provider recommended range by calling [phone] Patient will adhere to provider recommendations for diabetes management  Patient Self Management Activities take all medications as prescribed and report any negative side effects monitor and record blood sugar readings as directed adhere to a low carbohydrate diet that incorporates lean proteins, vegetables, whole grains, low glycemic fruits check feet daily noting any sores, cracks, injuries, or callous formations see PCP or podiatrist if she notices any changes in her legs, feet, or toenails Patient will visit PCP and have an A1C level checked every 3 to 6 months as directed  have a yearly eye exam to monitor for  vascular changes associated with diabetes and will request that the report be sent to her pcp.  consult with her PCP regarding any changes in her health or new or worsening symptoms       AWV goal      01/19/2019 AWV Goal: Medication Compliance  Over the next year, patient will take of prescription medications as directed and will report any side effects to the prescribing provider.  Over the next year, patient will advise prescribing provider of any dificulty with obtaining prescription medications. Over the next year, patient will work with prescribing provider and community resources to obtain prescription assistance if needed and if qualified.   01/19/2019 AWV Goal: Keep All Scheduled Appointments  Over the next year, patient will attend all scheduled appointments with their PCP and any specialists that they see.      DIET - INCREASE WATER INTAKE      Exercise 3x per week (15-20 min per time)      Stationary bicycle is a great option for exercise due to balance issues.      Increase physical activity      Goal is 150 minutes per week Goals Addressed             This Visit's Progress    AWV goal   On track    01/19/2019 AWV Goal: Medication Compliance  Over the next year, patient will take of prescription medications as directed and will report any side effects to the prescribing provider.  Over the next year, patient will advise prescribing provider of any dificulty with obtaining prescription medications. Over the next year, patient will work with prescribing provider and community resources to obtain prescription  assistance if needed and if qualified.   01/19/2019 AWV Goal: Keep All Scheduled Appointments  Over the next year, patient will attend all scheduled appointments with their PCP and any specialists that they see.     Increase physical activity   Not on track    Goal is 150 minutes per week       LIFESTYLE - DECREASE FALLS RISK       Goals Addressed              This Visit's Progress    AWV goal   On track    01/19/2019 AWV Goal: Medication Compliance  Over the next year, patient will take of prescription medications as directed and will report any side effects to the prescribing provider.  Over the next year, patient will advise prescribing provider of any dificulty with obtaining prescription medications. Over the next year, patient will work with prescribing provider and community resources to obtain prescription assistance if needed and if qualified.   01/19/2019 AWV Goal: Keep All Scheduled Appointments  Over the next year, patient will attend all scheduled appointments with their PCP and any specialists that they see.     LIFESTYLE - DECREASE FALLS RISK                     LIFESTYLE - DECREASE FALLS RISK      Goals Addressed             This Visit's Progress    AWV goal   On track    01/19/2019 AWV Goal: Medication Compliance  Over the next year, patient will take of prescription medications as directed and will report any side effects to the prescribing provider.  Over the next year, patient will advise prescribing provider of any dificulty with obtaining prescription medications. Over the next year, patient will work with prescribing provider and community resources to obtain prescription assistance if needed and if qualified.   01/19/2019 AWV Goal: Keep All Scheduled Appointments  Over the next year, patient will attend all scheduled appointments with their PCP and any specialists that they see.     LIFESTYLE - DECREASE FALLS RISK               T2DM PHARMD (pt-stated)      Current Barriers:  Unable to independently afford treatment regimen Unable to achieve control of T2DM, CKD3A, HLD  Unable to maintain control of T2DM  Pharmacist Clinical Goal(s):  patient will verbalize ability to afford treatment regimen achieve control of t2dm as evidenced by goal a1c<7% through collaboration with PharmD and  provider.   Interventions: 1:1 collaboration with Gabriel Earing, FNP regarding development and update of comprehensive plan of care as evidenced by provider attestation and co-signature Inter-disciplinary care team collaboration (see longitudinal plan of care) Comprehensive medication review performed; medication list updated in electronic medical record  Diabetes: Goal on track: NO. Uncontrolled-A1c 7.4-->9.4% variable BG readings; FBG okay/post prandials high GFR 45-->80-->59 CKD 3a Reports FBG <150, postprandials 200s Past intolerances: metformin, farxiga (SGLT2s) Current treatment: Tresiba 60 units, Novolog 20 units 2x w/ meals (only eats 2 meals daily), Ozempic 2mg  weekly;  PLAN: Continue Tresiba 50-60 units every morning, Novolog 20-30 units with meals (2x daily, only eats 2 meals), INCREASE TO Ozempic 2mg  weekly Post prandials higher--instructed patient to take insulin before eating-->must cover meals Avg 2 meals/day--must be consistent  Denies personal and family history of Medullary thyroid cancer (MTC) Refills sent for patient assistance with novo  nordisk 2024 (tresiba, novolog, ozempic) Patient uses [place] 2 CGM currently --> attempting to switch to [place] 3 CGM (HTA medicare okay to send to local drug store) Reports hyper/hypoglycemia (previously had) Discussed meal planning options and Plate method for healthy eating Avoid sugary drinks and desserts Incorporate balanced protein, non starchy veggies, 1 serving of carbohydrate with each meal Increase water intake Increase physical activity as able Discussed FOLLOWING A HEART HEALTHY DIET/HEALTHY PLATE METHOD Lipids currently controlled on rosuvastatin 40mg  Lipid Panel     Component Value Date/Time   CHOL 124 09/20/2022 0835   CHOL 107 02/05/2013 1002   TRIG 90 09/20/2022 0835   TRIG 149 11/30/2015 0947   TRIG 143 02/05/2013 1002   HDL 43 09/20/2022 0835   HDL 35 (L) 11/30/2015 0947   HDL 35 (L) 02/05/2013 1002    CHOLHDL 2.9 09/20/2022 0835   CHOLHDL 2.9 03/27/2022 0537   VLDL 22 03/27/2022 0537   LDLCALC 64 09/20/2022 0835   LDLCALC 195 (H) 04/26/2014 1029   LDLCALC 43 02/05/2013 1002   LDLDIRECT 93 04/06/2015 1038   LABVLDL 17 09/20/2022 0835   Current exercise: n/a  Patient Goals/Self-Care Activities patient will:  - take medications as prescribed as evidenced by patient report and record review check glucose using libre 3 CGM, document, and provide at future appointments collaborate with provider on medication access solutions         This is a list of the screening recommended for you and due dates:  Health Maintenance  Topic Date Due   COVID-19 Vaccine (3 - 2023-24 season) 05/25/2022   Colon Cancer Screening  04/07/2023*   DEXA scan (bone density measurement)  09/15/2023*   Complete foot exam   03/13/2023   Hemoglobin A1C  04/05/2023   Flu Shot  04/25/2023   Eye exam for diabetics  04/25/2023   Mammogram  08/28/2023   Yearly kidney health urinalysis for diabetes  09/21/2023   Yearly kidney function blood test for diabetes  10/07/2023   Medicare Annual Wellness Visit  01/24/2024   DTaP/Tdap/Td vaccine (3 - Td or Tdap) 12/12/2031   Pneumonia Vaccine  Completed   Hepatitis C Screening: USPSTF Recommendation to screen - Ages 79-79 yo.  Completed   Zoster (Shingles) Vaccine  Completed   HPV Vaccine  Aged Out   Stool Blood Test  Discontinued  *Topic was postponed. The date shown is not the original due date.    Advanced directives: Advance directive discussed with you today. I have provided a copy for you to complete at home and have notarized. Once this is complete please bring a copy in to our office so we can scan it into your chart.   Conditions/risks identified: Aim for 30 minutes of exercise or brisk walking, 6-8 glasses of water, and 5 servings of fruits and vegetables each day.   Next appointment: Follow up in one year for your annual wellness visit    Preventive  Care 65 Years and Older, Female Preventive care refers to lifestyle choices and visits with your health care provider that can promote health and wellness. What does preventive care include? A yearly physical exam. This is also called an annual well check. Dental exams once or twice a year. Routine eye exams. Ask your health care provider how often you should have your eyes checked. Personal lifestyle choices, including: Daily care of your teeth and gums. Regular physical activity. Eating a healthy diet. Avoiding tobacco and drug use. Limiting alcohol use. Practicing safe sex. Taking  low-dose aspirin every day. Taking vitamin and mineral supplements as recommended by your health care provider. What happens during an annual well check? The services and screenings done by your health care provider during your annual well check will depend on your age, overall health, lifestyle risk factors, and family history of disease. Counseling  Your health care provider may ask you questions about your: Alcohol use. Tobacco use. Drug use. Emotional well-being. Home and relationship well-being. Sexual activity. Eating habits. History of falls. Memory and ability to understand (cognition). Work and work Astronomer. Reproductive health. Screening  You may have the following tests or measurements: Height, weight, and BMI. Blood pressure. Lipid and cholesterol levels. These may be checked every 5 years, or more frequently if you are over 27 years old. Skin check. Lung cancer screening. You may have this screening every year starting at age 17 if you have a 30-pack-year history of smoking and currently smoke or have quit within the past 15 years. Fecal occult blood test (FOBT) of the stool. You may have this test every year starting at age 48. Flexible sigmoidoscopy or colonoscopy. You may have a sigmoidoscopy every 5 years or a colonoscopy every 10 years starting at age 85. Hepatitis C blood  test. Hepatitis B blood test. Sexually transmitted disease (STD) testing. Diabetes screening. This is done by checking your blood sugar (glucose) after you have not eaten for a while (fasting). You may have this done every 1-3 years. Bone density scan. This is done to screen for osteoporosis. You may have this done starting at age 9. Mammogram. This may be done every 1-2 years. Talk to your health care provider about how often you should have regular mammograms. Talk with your health care provider about your test results, treatment options, and if necessary, the need for more tests. Vaccines  Your health care provider may recommend certain vaccines, such as: Influenza vaccine. This is recommended every year. Tetanus, diphtheria, and acellular pertussis (Tdap, Td) vaccine. You may need a Td booster every 10 years. Zoster vaccine. You may need this after age 54. Pneumococcal 13-valent conjugate (PCV13) vaccine. One dose is recommended after age 109. Pneumococcal polysaccharide (PPSV23) vaccine. One dose is recommended after age 45. Talk to your health care provider about which screenings and vaccines you need and how often you need them. This information is not intended to replace advice given to you by your health care provider. Make sure you discuss any questions you have with your health care provider. Document Released: 10/07/2015 Document Revised: 05/30/2016 Document Reviewed: 07/12/2015 Elsevier Interactive Patient Education  2017 [employer].  Fall Prevention in the Home Falls can cause injuries. They can happen to people of all ages. There are many things you can do to make your home safe and to help prevent falls. What can I do on the outside of my home? Regularly fix the edges of walkways and driveways and fix any cracks. Remove anything that might make you trip as you walk through a door, such as a raised step or threshold. Trim any bushes or trees on the path to your home. Use  bright outdoor lighting. Clear any walking paths of anything that might make someone trip, such as rocks or tools. Regularly check to see if handrails are loose or broken. Make sure that both sides of any steps have handrails. Any raised decks and porches should have guardrails on the edges. Have any leaves, snow, or ice cleared regularly. Use sand or salt on walking paths  during winter. Clean up any spills in your garage right away. This includes oil or grease spills. What can I do in the bathroom? Use night lights. Install grab bars by the toilet and in the tub and shower. Do not use towel bars as grab bars. Use non-skid mats or decals in the tub or shower. If you need to sit down in the shower, use a plastic, non-slip stool. Keep the floor dry. Clean up any water that spills on the floor as soon as it happens. Remove soap buildup in the tub or shower regularly. Attach bath mats securely with double-sided non-slip rug tape. Do not have throw rugs and other things on the floor that can make you trip. What can I do in the bedroom? Use night lights. Make sure that you have a light by your bed that is easy to reach. Do not use any sheets or blankets that are too big for your bed. They should not hang down onto the floor. Have a firm chair that has side arms. You can use this for support while you get dressed. Do not have throw rugs and other things on the floor that can make you trip. What can I do in the kitchen? Clean up any spills right away. Avoid walking on wet floors. Keep items that you use a lot in easy-to-reach places. If you need to reach something above you, use a strong step stool that has a grab bar. Keep electrical cords out of the way. Do not use floor polish or wax that makes floors slippery. If you must use wax, use non-skid floor wax. Do not have throw rugs and other things on the floor that can make you trip. What can I do with my stairs? Do not leave any items on the  stairs. Make sure that there are handrails on both sides of the stairs and use them. Fix handrails that are broken or loose. Make sure that handrails are as long as the stairways. Check any carpeting to make sure that it is firmly attached to the stairs. Fix any carpet that is loose or worn. Avoid having throw rugs at the top or bottom of the stairs. If you do have throw rugs, attach them to the floor with carpet tape. Make sure that you have a light switch at the top of the stairs and the bottom of the stairs. If you do not have them, ask someone to add them for you. What else can I do to help prevent falls? Wear shoes that: Do not have high heels. Have rubber bottoms. Are comfortable and fit you well. Are closed at the toe. Do not wear sandals. If you use a stepladder: Make sure that it is fully opened. Do not climb a closed stepladder. Make sure that both sides of the stepladder are locked into place. Ask someone to hold it for you, if possible. Clearly mark and make sure that you can see: Any grab bars or handrails. First and last steps. Where the edge of each step is. Use tools that help you move around (mobility aids) if they are needed. These include: Canes. Walkers. Scooters. Crutches. Turn on the lights when you go into a dark area. Replace any light bulbs as soon as they burn out. Set up your furniture so you have a clear path. Avoid moving your furniture around. If any of your floors are uneven, fix them. If there are any pets around you, be aware of where they are. Review your  medicines with your doctor. Some medicines can make you feel dizzy. This can increase your chance of falling. Ask your doctor what other things that you can do to help prevent falls. This information is not intended to replace advice given to you by your health care provider. Make sure you discuss any questions you have with your health care provider. Document Released: 07/07/2009 Document Revised:  02/16/2016 Document Reviewed: 10/15/2014 Elsevier Interactive Patient Education  2017 [employer].

## 2023-02-04 ENCOUNTER — Telehealth: Payer: Self-pay | Admitting: Family Medicine

## 2023-02-04 ENCOUNTER — Ambulatory Visit (INDEPENDENT_AMBULATORY_CARE_PROVIDER_SITE_OTHER): Payer: PPO | Admitting: Family Medicine

## 2023-02-04 ENCOUNTER — Encounter: Payer: Self-pay | Admitting: Family Medicine

## 2023-02-04 ENCOUNTER — Ambulatory Visit (INDEPENDENT_AMBULATORY_CARE_PROVIDER_SITE_OTHER): Payer: PPO

## 2023-02-04 VITALS — BP 129/71 | HR 84 | Temp 98.3°F | Ht 65.0 in | Wt 152.0 lb

## 2023-02-04 DIAGNOSIS — I130 Hypertensive heart and chronic kidney disease with heart failure and stage 1 through stage 4 chronic kidney disease, or unspecified chronic kidney disease: Secondary | ICD-10-CM | POA: Diagnosis not present

## 2023-02-04 DIAGNOSIS — Z794 Long term (current) use of insulin: Secondary | ICD-10-CM | POA: Diagnosis not present

## 2023-02-04 DIAGNOSIS — R300 Dysuria: Secondary | ICD-10-CM | POA: Diagnosis not present

## 2023-02-04 DIAGNOSIS — E785 Hyperlipidemia, unspecified: Secondary | ICD-10-CM

## 2023-02-04 DIAGNOSIS — E1165 Type 2 diabetes mellitus with hyperglycemia: Secondary | ICD-10-CM | POA: Diagnosis not present

## 2023-02-04 DIAGNOSIS — I25110 Atherosclerotic heart disease of native coronary artery with unstable angina pectoris: Secondary | ICD-10-CM | POA: Diagnosis not present

## 2023-02-04 DIAGNOSIS — E1159 Type 2 diabetes mellitus with other circulatory complications: Secondary | ICD-10-CM

## 2023-02-04 DIAGNOSIS — M8580 Other specified disorders of bone density and structure, unspecified site: Secondary | ICD-10-CM | POA: Diagnosis not present

## 2023-02-04 DIAGNOSIS — M85851 Other specified disorders of bone density and structure, right thigh: Secondary | ICD-10-CM | POA: Diagnosis not present

## 2023-02-04 DIAGNOSIS — I509 Heart failure, unspecified: Secondary | ICD-10-CM | POA: Diagnosis not present

## 2023-02-04 DIAGNOSIS — N1831 Chronic kidney disease, stage 3a: Secondary | ICD-10-CM

## 2023-02-04 DIAGNOSIS — M85852 Other specified disorders of bone density and structure, left thigh: Secondary | ICD-10-CM | POA: Diagnosis not present

## 2023-02-04 DIAGNOSIS — E1169 Type 2 diabetes mellitus with other specified complication: Secondary | ICD-10-CM | POA: Diagnosis not present

## 2023-02-04 DIAGNOSIS — I152 Hypertension secondary to endocrine disorders: Secondary | ICD-10-CM

## 2023-02-04 DIAGNOSIS — M8588 Other specified disorders of bone density and structure, other site: Secondary | ICD-10-CM

## 2023-02-04 LAB — CMP14+EGFR
ALT: 9 IU/L (ref 0–32)
AST: 19 IU/L (ref 0–40)
Albumin/Globulin Ratio: 1.4 (ref 1.2–2.2)
Albumin: 3.9 g/dL (ref 3.8–4.8)
Alkaline Phosphatase: 64 IU/L (ref 44–121)
BUN/Creatinine Ratio: 10 — ABNORMAL LOW (ref 12–28)
BUN: 11 mg/dL (ref 8–27)
Bilirubin Total: 0.3 mg/dL (ref 0.0–1.2)
CO2: 28 mmol/L (ref 20–29)
Calcium: 9.4 mg/dL (ref 8.7–10.3)
Chloride: 98 mmol/L (ref 96–106)
Creatinine, Ser: 1.12 mg/dL — ABNORMAL HIGH (ref 0.57–1.00)
Globulin, Total: 2.7 g/dL (ref 1.5–4.5)
Glucose: 181 mg/dL — ABNORMAL HIGH (ref 70–99)
Potassium: 3.2 mmol/L — ABNORMAL LOW (ref 3.5–5.2)
Sodium: 140 mmol/L (ref 134–144)
Total Protein: 6.6 g/dL (ref 6.0–8.5)
eGFR: 52 mL/min/{1.73_m2} — ABNORMAL LOW (ref >59)

## 2023-02-04 LAB — MICROSCOPIC EXAMINATION
Renal Epithel, UA: NONE SEEN /HPF
WBC, UA: >30 /HPF — AB (ref 0–5)

## 2023-02-04 LAB — URINALYSIS, ROUTINE W REFLEX MICROSCOPIC
Bilirubin, UA: NEGATIVE
Ketones, UA: NEGATIVE
Nitrite, UA: NEGATIVE
Protein,UA: NEGATIVE
Specific Gravity, UA: 1.015 (ref 1.005–1.030)
Urobilinogen, Ur: 1 mg/dL (ref 0.2–1.0)
pH, UA: 5.5 (ref 5.0–7.5)

## 2023-02-04 LAB — BAYER DCA HB A1C WAIVED: HB A1C (BAYER DCA - WAIVED): 7.7 % — ABNORMAL HIGH (ref 4.8–5.6)

## 2023-02-04 MED ORDER — CEPHALEXIN 500 MG PO CAPS: 500.0000 mg | ORAL_CAPSULE | Freq: Two times a day (BID) | ORAL | 0 refills | Status: DC

## 2023-02-04 NOTE — Telephone Encounter (Signed)
This has been taken care of.

## 2023-02-04 NOTE — Progress Notes (Signed)
Established Patient Office Visit  Subjective:  Patient ID: [name], female    DOB: 1950-12-11  Age: 72 y.o. MRN: [id number]  CC:  Chief Complaint  Patient presents with   Medical Management of Chronic Issues   Diabetes   Chronic Kidney Disease    HPI [name] presents for chronic follow up.   1. DM Patient denies foot ulcerations, paresthesia of the feet, vomiting and weight loss.   Current diabetic medications include tresiba 60 units daily, novolog 36 units with meals (only eats 2 meals a day), ozempic 2 mg Compliant with meds - She has been taking novolog after she eats. She is afraid that she won't eat enough to count for the insulin she takes prior to meals   Current monitoring regimen: home blood tests - CGM Home blood sugar records: above target the majority of the time Any episodes of hypoglycemia? occasionally   Eye exam current (within one year): yes Weight trend: stable   Is She on ACE inhibitor or angiotensin II receptor blocker?  No Is She on statin? Yes crestor   2. HTN Complaint with meds - Yes Current Medications - norvasc, atenolol, lasix, imdur Checking BP at home: yes, reports 120s/70s Pertinent ROS:  Headache - No Fatigue - No Visual Disturbances - No Chest pain - No Dyspnea - No Palpitations - No LE edema - No   3. Dysuria [facility] came out 2 weeks ago and treated her with augmentin for a UTI. She continues to have burning and frequency. She reports that urine odor has improved.  4. Depression, anxiety Reports doing well. She has been in good spirits lately.     02/04/2023    8:07 AM 01/24/2023    3:00 PM 09/20/2022    8:28 AM  Depression screen PHQ 2/9  Decreased Interest 1 0 1  Down, Depressed, Hopeless 0 0 1  PHQ - 2 Score 1 0 2  Altered sleeping 1  1  Tired, decreased energy 3  2  Change in appetite 2  2  Feeling bad or failure about yourself  0  1  Trouble concentrating 0  0  Moving slowly or fidgety/restless 0  0   Suicidal thoughts 0  0  PHQ-9 Score 7  8  Difficult doing work/chores Somewhat difficult  Somewhat difficult      02/04/2023    8:08 AM 09/20/2022    8:28 AM 06/20/2022    8:17 AM 05/07/2022    8:03 AM  GAD 7 : Generalized Anxiety Score  Nervous, Anxious, on Edge 1 2 2 3   Control/stop worrying 1 1 2 3   Worry too much - different things 1 1 2 2   Trouble relaxing 1 2 2 3   Restless 0 1 2 3   Easily annoyed or irritable 0 2 2 3   Afraid - awful might happen 0 1 2 3   Total GAD 7 Score 4 10 14 20   Anxiety Difficulty Not difficult at all Somewhat difficult Somewhat difficult Very difficult     Past Medical History:  Diagnosis Date   Anxiety    CAD (coronary artery disease)    DES to circumflex 02/2007, BMS to LAD and PTCA diagonal 03/2007   Carotid artery plaque    Mild   Cataract    Depression    Diverticulitis, colon    Elevated d-dimer 01/08/2014   Essential hypertension, benign    GERD (gastroesophageal reflux disease)    H/O hiatal hernia    HLD (  hyperlipidemia)    IDDM (insulin dependent diabetes mellitus)    Migraine    "used to have them really bad; don't have them anymore" (01/07/2014)   MS (multiple sclerosis) (HCC)    Not confirmed   PAT (paroxysmal atrial tachycardia)    Prolapse of uterus    PVD (peripheral vascular disease) (HCC)    TIA (transient ischemic attack) 1980's    Past Surgical History:  Procedure Laterality Date   ABDOMINAL HYSTERECTOMY  1986   ovaries remain - prolaspe uterus    APPENDECTOMY  ~ 1970   BREAST BIOPSY Right 1980's   BREAST LUMPECTOMY Right 1980's   Dr. Luan Moore    CARDIAC CATHETERIZATION  01/07/2014   CHOLECYSTECTOMY  ?1987   COLONOSCOPY  2002   Dr. Anwar--> Severe diverticular changes in the region of the sigmoid and descending colon with scattered diverticular changes throughout the rest of the colon. No polyps, ulcerations. Despite numerous manipulations, the tip of the scope could not be tipped into the cecal area.    COLONOSCOPY  01/10/2012   Procedure: COLONOSCOPY;  Surgeon: Karen Ade, MD;  Location: [facility];  Service: Endoscopy;  Laterality: N/A;  1:55   CORONARY ANGIOPLASTY WITH STENT PLACEMENT  ~ 1997 X 2   "2 + 1"   CORONARY BALLOON ANGIOPLASTY N/A 10/05/2022   Procedure: CORONARY BALLOON ANGIOPLASTY;  Surgeon: Karen Bollman, MD;  Location: [facility] INVASIVE CV LAB;  Service: Cardiovascular;  Laterality: N/A;   CORONARY PRESSURE/FFR STUDY N/A 03/08/2017   Procedure: Intravascular Pressure Wire/FFR Study;  Surgeon: Karen Kendall, MD;  Location: [facility] INVASIVE CV LAB;  Service: Cardiovascular;  Laterality: N/A;   CORONARY STENT INTERVENTION N/A 03/26/2022   Procedure: CORONARY STENT INTERVENTION;  Surgeon: Karen Gess, MD;  Location: [facility] INVASIVE CV LAB;  Service: Cardiovascular;  Laterality: N/A;   EYE SURGERY Bilateral 2014   cataract   LEFT HEART CATH AND CORONARY ANGIOGRAPHY N/A 03/08/2017   Procedure: Left Heart Cath and Coronary Angiography;  Surgeon: Karen Kendall, MD;  Location: [facility] INVASIVE CV LAB;  Service: Cardiovascular;  Laterality: N/A;   LEFT HEART CATH AND CORONARY ANGIOGRAPHY N/A 03/26/2022   Procedure: LEFT HEART CATH AND CORONARY ANGIOGRAPHY;  Surgeon: Karen Gess, MD;  Location: [facility] INVASIVE CV LAB;  Service: Cardiovascular;  Laterality: N/A;   LEFT HEART CATH AND CORONARY ANGIOGRAPHY N/A 10/05/2022   Procedure: LEFT HEART CATH AND CORONARY ANGIOGRAPHY;  Surgeon: Karen Bollman, MD;  Location: [facility] INVASIVE CV LAB;  Service: Cardiovascular;  Laterality: N/A;   LEFT HEART CATHETERIZATION WITH CORONARY ANGIOGRAM N/A 01/07/2014   Procedure: LEFT HEART CATHETERIZATION WITH CORONARY ANGIOGRAM;  Surgeon: Karen Morale, MD;  Location: [facility] CATH LAB;  Service: Cardiovascular;  Laterality: N/A;    Family History  Problem Relation Age of Onset   Heart attack Mother 75   Diabetes Mother    Hypertension Mother    Heart attack Father 40   Heart attack Brother 32       x 6   Heart  disease Brother    Diabetes Brother    Colon cancer Paternal Aunt        37s, died with brain anuerysm   Crohn's disease Cousin        paternal   Diabetes Sister    GER disease Daughter    Cervical cancer Daughter    Diabetes Daughter     Social History   Socioeconomic History   Marital status: Widowed    Spouse name: Not on  file   Number of children: 4   Years of education: 11   Highest education level: 11th grade  Occupational History   Occupation: Disability    Employer: DISABLED  Tobacco Use   Smoking status: Never   Smokeless tobacco: Never   Tobacco comments:    spouse, 41 years - husband has quit 01/2011  Vaping Use   Vaping Use: Never used  Substance and Sexual Activity   Alcohol use: No   Drug use: No   Sexual activity: Not Currently  Other Topics Concern   Not on file  Social History Narrative   Lives alone, one level, handicap accessible bathroom, her children all live nearby   Social Determinants of Health   Financial Resource Strain: Low Risk  (01/24/2023)   Overall Financial Resource Strain (CARDIA)    Difficulty of Paying Living Expenses: Not hard at all  Food Insecurity: No Food Insecurity (01/24/2023)   Hunger Vital Sign    Worried About Running Out of Food in the Last Year: Never true    Ran Out of Food in the Last Year: Never true  Transportation Needs: No Transportation Needs (01/24/2023)   PRAPARE - Administrator, Civil Service (Medical): No    Lack of Transportation (Non-Medical): No  Physical Activity: Insufficiently Active (01/24/2023)   Exercise Vital Sign    Days of Exercise per Week: 3 days    Minutes of Exercise per Session: 30 min  Stress: No Stress Concern Present (01/24/2023)   [employer] of Occupational Health - Occupational Stress Questionnaire    Feeling of Stress : Not at all  Social Connections: Moderately Integrated (01/24/2023)   Social Connection and Isolation Panel [NHANES]    Frequency of Communication with  Friends and Family: More than three times a week    Frequency of Social Gatherings with Friends and Family: More than three times a week    Attends Religious Services: More than 4 times per year    Active Member of [employer] or Organizations: No    Attends Banker Meetings: Never    Marital Status: Married  Catering manager Violence: Not At Risk (01/24/2023)   Humiliation, Afraid, Rape, and Kick questionnaire    Fear of Current or Ex-Partner: No    Emotionally Abused: No    Physically Abused: No    Sexually Abused: No    Outpatient Medications Prior to Visit  Medication Sig Dispense Refill   amLODipine (NORVASC) 10 MG tablet Take 1 tablet (10 mg total) by mouth daily. 90 tablet 3   aspirin 81 MG chewable tablet Chew 1 tablet (81 mg total) by mouth daily. 90 tablet 3   atenolol (TENORMIN) 50 MG tablet Take 1 tablet (50 mg total) by mouth 2 (two) times daily. 180 tablet 3   clopidogrel (PLAVIX) 75 MG tablet Take 1 tablet (75 mg total) by mouth daily with breakfast. 90 tablet 3   Continuous Blood Gluc Sensor (FREESTYLE LIBRE 3 SENSOR) MISC Place 1 sensor on the skin every 14 days. Use to check glucose continuously. DX:e11.65 2 each 5   furosemide (LASIX) 40 MG tablet Take 1 tablet (40 mg total) by mouth daily. 90 tablet 3   insulin aspart (NOVOLOG FLEXPEN) 100 UNIT/ML FlexPen Inject 36 Units into the skin 3 (three) times daily with meals.     insulin degludec (TRESIBA FLEXTOUCH) 100 UNIT/ML FlexTouch Pen Inject 60 Units into the skin daily.     isosorbide mononitrate (IMDUR) 120 MG 24 hr tablet  Take 1 tablet (120 mg total) by mouth daily. 90 tablet 3   lisinopril (ZESTRIL) 10 MG tablet Take 1 tablet (10 mg total) by mouth daily. 90 tablet 3   nitroGLYCERIN (NITROSTAT) 0.4 MG SL tablet DISSOLVE 1 TABLET UNDER TONGUE FOR CHESTPAIN.MAY REPEAT EVERY 5 MINUTES FOR 3 DOSES.IF NO RELIEF CALL 911 OR GO TO ER 25 tablet 3   potassium chloride (KLOR-CON M) 10 MEQ tablet Take 1 tablet (10 mEq  total) by mouth 2 (two) times daily. (Patient taking differently: Take 10 mEq by mouth daily.) 80 tablet 1   rOPINIRole (REQUIP) 0.5 MG tablet Take 2 tablets (1 mg total) by mouth daily as needed (restless leg).     rosuvastatin (CRESTOR) 20 MG tablet TAKE ONE (1) TABLET BY MOUTH EVERY DAY 90 tablet 0   Semaglutide, 1 MG/DOSE, (OZEMPIC, 1 MG/DOSE,) 2 MG/1.5ML SOPN Inject 2 mg into the skin once a week.     sertraline (ZOLOFT) 100 MG tablet Take 1 tablet (100 mg total) by mouth daily. 90 tablet 3   traZODone (DESYREL) 100 MG tablet Take 1 tablet (100 mg total) by mouth at bedtime as needed for sleep. 90 tablet 1   Iron Combinations (IRON COMPLEX PO) Take by mouth daily. (Patient not taking: Reported on 02/04/2023)     No facility-administered medications prior to visit.    Allergies  Allergen Reactions   Iohexol      Desc: pt had syncopal episode with nausea post IV CM late 1990's,  pt has had prednisone prep with heart caths x 2 without problem  kdean 04/16/07, Onset Date: [id number]    Ticlid [Ticlopidine Hcl] Nausea And Vomiting   Jardiance [Empagliflozin] Other (See Comments)    Recurrent UTIs   Metformin And Related Diarrhea   Codeine Nausea And Vomiting and Palpitations    ROS Review of Systems As per HPI.   Objective:    Physical Exam Vitals and nursing note reviewed.  Constitutional:      General: She is not in acute distress.    Appearance: She is not ill-appearing, toxic-appearing or diaphoretic.  HENT:     Head: Normocephalic and atraumatic.  Neck:     Thyroid: No thyroid mass, thyromegaly or thyroid tenderness.  Cardiovascular:     Rate and Rhythm: Normal rate and regular rhythm.     Heart sounds: Normal heart sounds. No murmur heard. Pulmonary:     Effort: Pulmonary effort is normal. No respiratory distress.     Breath sounds: Normal breath sounds.  Abdominal:     General: Bowel sounds are normal. There is no distension.     Palpations: Abdomen is soft.      Tenderness: There is no abdominal tenderness. There is no right CVA tenderness, left CVA tenderness, guarding or rebound.  Musculoskeletal:     Cervical back: Neck supple. No rigidity.     Right lower leg: No edema.     Left lower leg: No edema.  Skin:    General: Skin is warm and dry.  Neurological:     General: No focal deficit present.     Mental Status: She is alert and oriented to person, place, and time.     Gait: Gait normal.  Psychiatric:        Mood and Affect: Mood normal.        Behavior: Behavior normal.        Thought Content: Thought content normal.        Judgment: Judgment normal.  Urine dipstick shows positive for RBC's and positive for leukocytes.  Micro exam: >30 WBC's per HPF, 0 RBC's per HPF, and many+ bacteria.  BP 129/71   Pulse 84   Temp 98.3 F (36.8 C) (Temporal)   Ht 5\' 5"  (1.651 m)   Wt 152 lb (68.9 kg)   SpO2 100%   BMI 25.29 kg/m  Wt Readings from Last 3 Encounters:  02/04/23 152 lb (68.9 kg)  01/24/23 152 lb (68.9 kg)  11/21/22 160 lb (72.6 kg)    Health Maintenance Due  Topic Date Due   COVID-19 Vaccine (3 - 2023-24 season) 05/25/2022    There are no preventive care reminders to display for this patient.  Lab Results  Component Value Date   TSH 2.110 12/11/2021   Lab Results  Component Value Date   WBC 7.2 10/06/2022   HGB 9.6 (L) 10/06/2022   HCT 29.8 (L) 10/06/2022   MCV 89.8 10/06/2022   PLT 145 (L) 10/06/2022     Assessment & Plan:   Ina was seen today for medical management of chronic issues, diabetes and chronic kidney disease.  Diagnoses and all orders for this visit:  Type 2 diabetes mellitus with hyperglycemia, with long-term current use of insulin (HCC) A1c 7.7 today, not at goal of <7, much improved from previous. In target 40% on time. Medication changes today: Increase tresiba to 62 units, discussed to take novolog prior to meals. She is on ACE/ARB and statin. Eye exam: UTD. Foot exam: UTD. Urine micro:  UTD. Diet and exercise.  -     Bayer DCA Hb A1c Waived  Hypertension associated with diabetes (HCC) Well controlled on current regimen.   Hyperlipidemia associated with type 2 diabetes mellitus (HCC) Last LDL 63. Well controlled on current regimen.   Stage 3a chronic kidney disease (HCC) On ACE. Labs pending.  -     CMP14+EGFR  Osteopenia, unspecified location DEXA today. On vitamind D and calcium supplements.  -     DG [facility] DEXA  Chronic congestive heart failure, unspecified heart failure type (HCC) Coronary artery disease involving native coronary artery of native heart with unstable angina pectoris [facility]) Managed by cardiology. On statin, aspirin, atenolol, plavix, imdur, lisinopril.   Dysuria -     Urinalysis, Routine w reflex microscopic -     Urine Culture -     cephALEXin (KEFLEX) 500 MG capsule; Take 1 capsule (500 mg total) by mouth 2 (two) times daily.     Return in about 3 months (around 05/07/2023) for chronic follow up.   The patient indicates understanding of these issues and agrees with the plan.   Gabriel Earing, FNP

## 2023-02-06 ENCOUNTER — Other Ambulatory Visit: Payer: Self-pay | Admitting: Family Medicine

## 2023-02-06 DIAGNOSIS — M8580 Other specified disorders of bone density and structure, unspecified site: Secondary | ICD-10-CM

## 2023-02-06 MED ORDER — ALENDRONATE SODIUM 70 MG PO TABS: 70.0000 mg | ORAL_TABLET | ORAL | 3 refills | Status: DC

## 2023-02-07 ENCOUNTER — Other Ambulatory Visit: Payer: Self-pay | Admitting: Pharmacist

## 2023-02-07 LAB — URINE CULTURE

## 2023-02-07 NOTE — Progress Notes (Addendum)
    Pharmacy Note   02/07/2023 Name:  [name]           MRN:  [id number]      DOB:  1950/09/27   Summary:   Diabetes: Goal on track: NO, but much improved! Uncontrolled-A1c 8.5-->7.7% variable BG readings; FBG running higher up to 200/post prandials up to 200 GFR 59-->52 CKD 3a Patient cancelled endocrine appt due to cost; she is unable to afford specialist copay and would not be able to incur additional charges (pump, etc) Past intolerances: metformin, farxiga (SGLT2s) Current treatment: Tresiba 60 units, Novolog 20 units 2x w/ meals (only eats 2 meals daily), Ozempic 2mg  weekly;  PLAN: Continue Tresiba 50-60 units every morning, Novolog 20-30 units with meals (2x daily, only eats 2 meals), Ozempic 2mg  weekly Denies personal and family history of Medullary thyroid cancer (MTC) patient assistance with novo nordisk 2024 (tresiba, novolog, ozempic) Now using [employer] 3 CGM (HTA medicare okay to send to local drug store) Reports hyper/hypoglycemia (previously had) Discussed meal planning options and Plate method for healthy eating Avoid sugary drinks and desserts Incorporate balanced protein, non starchy veggies, 1 serving of carbohydrate with each meal Increase water intake Increase physical activity as able Discussed FOLLOWING A HEART HEALTHY DIET/HEALTHY PLATE METHOD Lipids currently controlled on rosuvastatin 40mg     Ozempic 2mg  weekly supply #4boxes in lab fridge for pickup;  Patient notified   Kieth Brightly, PharmD, BCACP Clinical Pharmacist, [facility]

## 2023-02-12 ENCOUNTER — Telehealth: Payer: Self-pay | Admitting: *Deleted

## 2023-02-12 ENCOUNTER — Inpatient Hospital Stay (HOSPITAL_COMMUNITY)
Admission: EM | Admit: 2023-02-12 | Discharge: 2023-02-23 | DRG: 233 | Disposition: A | Discharge status: Home / Self Care | Payer: PPO | Attending: Thoracic Surgery (Cardiothoracic Vascular Surgery) | Admitting: Thoracic Surgery (Cardiothoracic Vascular Surgery)

## 2023-02-12 ENCOUNTER — Emergency Department (HOSPITAL_COMMUNITY): Payer: PPO

## 2023-02-12 ENCOUNTER — Ambulatory Visit: Payer: PPO | Admitting: Nurse Practitioner

## 2023-02-12 ENCOUNTER — Encounter (HOSPITAL_COMMUNITY): Payer: Self-pay

## 2023-02-12 ENCOUNTER — Other Ambulatory Visit: Payer: Self-pay

## 2023-02-12 DIAGNOSIS — K219 Gastro-esophageal reflux disease without esophagitis: Secondary | ICD-10-CM | POA: Diagnosis present

## 2023-02-12 DIAGNOSIS — Y713 Surgical instruments, materials and cardiovascular devices (including sutures) associated with adverse incidents: Secondary | ICD-10-CM | POA: Diagnosis present

## 2023-02-12 DIAGNOSIS — I25119 Atherosclerotic heart disease of native coronary artery with unspecified angina pectoris: Secondary | ICD-10-CM | POA: Diagnosis not present

## 2023-02-12 DIAGNOSIS — Z8249 Family history of ischemic heart disease and other diseases of the circulatory system: Secondary | ICD-10-CM | POA: Diagnosis not present

## 2023-02-12 DIAGNOSIS — I200 Unstable angina: Secondary | ICD-10-CM | POA: Diagnosis present

## 2023-02-12 DIAGNOSIS — E1169 Type 2 diabetes mellitus with other specified complication: Secondary | ICD-10-CM | POA: Diagnosis present

## 2023-02-12 DIAGNOSIS — J939 Pneumothorax, unspecified: Secondary | ICD-10-CM | POA: Diagnosis not present

## 2023-02-12 DIAGNOSIS — Z79899 Other long term (current) drug therapy: Secondary | ICD-10-CM

## 2023-02-12 DIAGNOSIS — Z951 Presence of aortocoronary bypass graft: Secondary | ICD-10-CM | POA: Diagnosis not present

## 2023-02-12 DIAGNOSIS — E755 Other lipid storage disorders: Secondary | ICD-10-CM | POA: Diagnosis not present

## 2023-02-12 DIAGNOSIS — Z9911 Dependence on respirator [ventilator] status: Secondary | ICD-10-CM | POA: Diagnosis not present

## 2023-02-12 DIAGNOSIS — Z833 Family history of diabetes mellitus: Secondary | ICD-10-CM

## 2023-02-12 DIAGNOSIS — E1159 Type 2 diabetes mellitus with other circulatory complications: Secondary | ICD-10-CM | POA: Diagnosis present

## 2023-02-12 DIAGNOSIS — R918 Other nonspecific abnormal finding of lung field: Secondary | ICD-10-CM | POA: Diagnosis not present

## 2023-02-12 DIAGNOSIS — R079 Chest pain, unspecified: Secondary | ICD-10-CM | POA: Diagnosis not present

## 2023-02-12 DIAGNOSIS — K5900 Constipation, unspecified: Secondary | ICD-10-CM | POA: Diagnosis not present

## 2023-02-12 DIAGNOSIS — N1831 Chronic kidney disease, stage 3a: Secondary | ICD-10-CM | POA: Diagnosis present

## 2023-02-12 DIAGNOSIS — E1151 Type 2 diabetes mellitus with diabetic peripheral angiopathy without gangrene: Secondary | ICD-10-CM | POA: Diagnosis present

## 2023-02-12 DIAGNOSIS — I4891 Unspecified atrial fibrillation: Secondary | ICD-10-CM | POA: Diagnosis not present

## 2023-02-12 DIAGNOSIS — I25110 Atherosclerotic heart disease of native coronary artery with unstable angina pectoris: Secondary | ICD-10-CM | POA: Diagnosis not present

## 2023-02-12 DIAGNOSIS — E1122 Type 2 diabetes mellitus with diabetic chronic kidney disease: Secondary | ICD-10-CM | POA: Diagnosis not present

## 2023-02-12 DIAGNOSIS — R1111 Vomiting without nausea: Secondary | ICD-10-CM | POA: Diagnosis not present

## 2023-02-12 DIAGNOSIS — I2109 ST elevation (STEMI) myocardial infarction involving other coronary artery of anterior wall: Secondary | ICD-10-CM | POA: Diagnosis not present

## 2023-02-12 DIAGNOSIS — Z8673 Personal history of transient ischemic attack (TIA), and cerebral infarction without residual deficits: Secondary | ICD-10-CM

## 2023-02-12 DIAGNOSIS — R0789 Other chest pain: Secondary | ICD-10-CM | POA: Diagnosis not present

## 2023-02-12 DIAGNOSIS — T82855A Stenosis of coronary artery stent, initial encounter: Secondary | ICD-10-CM | POA: Diagnosis not present

## 2023-02-12 DIAGNOSIS — Z794 Long term (current) use of insulin: Secondary | ICD-10-CM | POA: Diagnosis not present

## 2023-02-12 DIAGNOSIS — N179 Acute kidney failure, unspecified: Secondary | ICD-10-CM | POA: Diagnosis not present

## 2023-02-12 DIAGNOSIS — I152 Hypertension secondary to endocrine disorders: Secondary | ICD-10-CM | POA: Diagnosis present

## 2023-02-12 DIAGNOSIS — Z888 Allergy status to other drugs, medicaments and biological substances status: Secondary | ICD-10-CM

## 2023-02-12 DIAGNOSIS — F411 Generalized anxiety disorder: Secondary | ICD-10-CM | POA: Diagnosis not present

## 2023-02-12 DIAGNOSIS — I110 Hypertensive heart disease with heart failure: Secondary | ICD-10-CM | POA: Diagnosis not present

## 2023-02-12 DIAGNOSIS — Z7983 Long term (current) use of bisphosphonates: Secondary | ICD-10-CM

## 2023-02-12 DIAGNOSIS — G4440 Drug-induced headache, not elsewhere classified, not intractable: Secondary | ICD-10-CM | POA: Diagnosis present

## 2023-02-12 DIAGNOSIS — F32A Depression, unspecified: Secondary | ICD-10-CM | POA: Diagnosis present

## 2023-02-12 DIAGNOSIS — I161 Hypertensive emergency: Secondary | ICD-10-CM | POA: Diagnosis present

## 2023-02-12 DIAGNOSIS — J9601 Acute respiratory failure with hypoxia: Secondary | ICD-10-CM

## 2023-02-12 DIAGNOSIS — I2510 Atherosclerotic heart disease of native coronary artery without angina pectoris: Secondary | ICD-10-CM | POA: Diagnosis not present

## 2023-02-12 DIAGNOSIS — E1165 Type 2 diabetes mellitus with hyperglycemia: Secondary | ICD-10-CM | POA: Diagnosis present

## 2023-02-12 DIAGNOSIS — Z1152 Encounter for screening for COVID-19: Secondary | ICD-10-CM

## 2023-02-12 DIAGNOSIS — G2581 Restless legs syndrome: Secondary | ICD-10-CM | POA: Diagnosis not present

## 2023-02-12 DIAGNOSIS — Z885 Allergy status to narcotic agent status: Secondary | ICD-10-CM

## 2023-02-12 DIAGNOSIS — I119 Hypertensive heart disease without heart failure: Secondary | ICD-10-CM | POA: Diagnosis not present

## 2023-02-12 DIAGNOSIS — J9811 Atelectasis: Secondary | ICD-10-CM | POA: Diagnosis not present

## 2023-02-12 DIAGNOSIS — Z7985 Long-term (current) use of injectable non-insulin antidiabetic drugs: Secondary | ICD-10-CM

## 2023-02-12 DIAGNOSIS — Z7902 Long term (current) use of antithrombotics/antiplatelets: Secondary | ICD-10-CM

## 2023-02-12 DIAGNOSIS — E669 Obesity, unspecified: Secondary | ICD-10-CM | POA: Diagnosis not present

## 2023-02-12 DIAGNOSIS — I340 Nonrheumatic mitral (valve) insufficiency: Secondary | ICD-10-CM | POA: Diagnosis not present

## 2023-02-12 DIAGNOSIS — Z800 Family history of malignant neoplasm of digestive organs: Secondary | ICD-10-CM

## 2023-02-12 DIAGNOSIS — I10 Essential (primary) hypertension: Secondary | ICD-10-CM | POA: Diagnosis present

## 2023-02-12 DIAGNOSIS — Z8049 Family history of malignant neoplasm of other genital organs: Secondary | ICD-10-CM

## 2023-02-12 DIAGNOSIS — T463X5A Adverse effect of coronary vasodilators, initial encounter: Secondary | ICD-10-CM | POA: Diagnosis present

## 2023-02-12 DIAGNOSIS — E785 Hyperlipidemia, unspecified: Secondary | ICD-10-CM | POA: Diagnosis present

## 2023-02-12 DIAGNOSIS — Z91041 Radiographic dye allergy status: Secondary | ICD-10-CM

## 2023-02-12 DIAGNOSIS — I509 Heart failure, unspecified: Secondary | ICD-10-CM | POA: Diagnosis not present

## 2023-02-12 DIAGNOSIS — E876 Hypokalemia: Secondary | ICD-10-CM | POA: Diagnosis present

## 2023-02-12 DIAGNOSIS — Z7982 Long term (current) use of aspirin: Secondary | ICD-10-CM

## 2023-02-12 DIAGNOSIS — M8580 Other specified disorders of bone density and structure, unspecified site: Secondary | ICD-10-CM | POA: Diagnosis present

## 2023-02-12 DIAGNOSIS — E782 Mixed hyperlipidemia: Secondary | ICD-10-CM | POA: Diagnosis present

## 2023-02-12 DIAGNOSIS — R112 Nausea with vomiting, unspecified: Secondary | ICD-10-CM | POA: Diagnosis not present

## 2023-02-12 DIAGNOSIS — Z01810 Encounter for preprocedural cardiovascular examination: Secondary | ICD-10-CM | POA: Diagnosis not present

## 2023-02-12 DIAGNOSIS — J90 Pleural effusion, not elsewhere classified: Secondary | ICD-10-CM | POA: Diagnosis not present

## 2023-02-12 LAB — BASIC METABOLIC PANEL
Anion gap: 12 (ref 5–15)
BUN: 13 mg/dL (ref 8–23)
CO2: 27 mmol/L (ref 22–32)
Calcium: 9.1 mg/dL (ref 8.9–10.3)
Chloride: 95 mmol/L — ABNORMAL LOW (ref 98–111)
Creatinine, Ser: 1.11 mg/dL — ABNORMAL HIGH (ref 0.44–1.00)
GFR, Estimated: 52 mL/min — ABNORMAL LOW (ref >60)
Glucose, Bld: 232 mg/dL — ABNORMAL HIGH (ref 70–99)
Potassium: 3 mmol/L — ABNORMAL LOW (ref 3.5–5.1)
Sodium: 134 mmol/L — ABNORMAL LOW (ref 135–145)

## 2023-02-12 LAB — CBC
HCT: 36.8 % (ref 36.0–46.0)
Hemoglobin: 12.8 g/dL (ref 12.0–15.0)
MCH: 29.7 pg (ref 26.0–34.0)
MCHC: 34.8 g/dL (ref 30.0–36.0)
MCV: 85.4 fL (ref 80.0–100.0)
Platelets: 184 10*3/uL (ref 150–400)
RBC: 4.31 MIL/uL (ref 3.87–5.11)
RDW: 13.6 % (ref 11.5–15.5)
WBC: 8.7 10*3/uL (ref 4.0–10.5)
nRBC: 0 % (ref 0.0–0.2)

## 2023-02-12 LAB — TROPONIN I (HIGH SENSITIVITY)
Troponin I (High Sensitivity): 21 ng/L — ABNORMAL HIGH (ref <18)
Troponin I (High Sensitivity): 21 ng/L — ABNORMAL HIGH (ref <18)
Troponin I (High Sensitivity): 20 ng/L — ABNORMAL HIGH (ref <18)
Troponin I (High Sensitivity): 20 ng/L — ABNORMAL HIGH (ref <18)

## 2023-02-12 MED ORDER — ASPIRIN 81 MG PO CHEW: 81.0000 mg | CHEWABLE_TABLET | Freq: Every day | ORAL | Status: DC

## 2023-02-12 MED ORDER — TRAZODONE HCL 50 MG PO TABS
100.0000 mg | ORAL_TABLET | Freq: Every evening | ORAL | Status: DC | PRN
  Administered 2023-02-14: 100 mg via ORAL
  Filled 2023-02-12: qty 1

## 2023-02-12 MED ORDER — NITROGLYCERIN 0.4 MG SL SUBL
0.4000 mg | SUBLINGUAL_TABLET | SUBLINGUAL | Status: DC | PRN
  Administered 2023-02-12 – 2023-02-13 (×5): 0.4 mg via SUBLINGUAL
  Filled 2023-02-12 (×3): qty 1

## 2023-02-12 MED ORDER — ATENOLOL 50 MG PO TABS
50.0000 mg | ORAL_TABLET | Freq: Two times a day (BID) | ORAL | Status: DC
  Administered 2023-02-12 – 2023-02-14 (×4): 50 mg via ORAL
  Filled 2023-02-12 (×3): qty 1
  Filled 2023-02-12: qty 2

## 2023-02-12 MED ORDER — CLOPIDOGREL BISULFATE 75 MG PO TABS: 75.0000 mg | ORAL_TABLET | Freq: Every day | ORAL | Status: DC

## 2023-02-12 MED ORDER — ASPIRIN 81 MG PO TBEC
81.0000 mg | DELAYED_RELEASE_TABLET | Freq: Every day | ORAL | Status: DC
  Administered 2023-02-13 – 2023-02-18 (×5): 81 mg via ORAL
  Filled 2023-02-12 (×5): qty 1

## 2023-02-12 MED ORDER — NITROGLYCERIN 0.4 MG SL SUBL: 0.4000 mg | SUBLINGUAL_TABLET | SUBLINGUAL | Status: DC | PRN

## 2023-02-12 MED ORDER — HEPARIN (PORCINE) 25000 UT/250ML-% IV SOLN
800.0000 [IU]/h | INTRAVENOUS | Status: DC
  Administered 2023-02-12: 900 [IU]/h via INTRAVENOUS
  Administered 2023-02-13: 800 [IU]/h via INTRAVENOUS
  Filled 2023-02-12 (×2): qty 250

## 2023-02-12 MED ORDER — MORPHINE SULFATE (PF) 4 MG/ML IV SOLN
4.0000 mg | Freq: Once | INTRAVENOUS | Status: AC
  Administered 2023-02-12: 4 mg via INTRAVENOUS
  Filled 2023-02-12: qty 1

## 2023-02-12 MED ORDER — ISOSORBIDE MONONITRATE ER 30 MG PO TB24
120.0000 mg | ORAL_TABLET | Freq: Every day | ORAL | Status: DC
  Administered 2023-02-12 – 2023-02-18 (×7): 120 mg via ORAL
  Filled 2023-02-12 (×7): qty 2

## 2023-02-12 MED ORDER — FUROSEMIDE 40 MG PO TABS
40.0000 mg | ORAL_TABLET | Freq: Every day | ORAL | Status: DC
  Administered 2023-02-13 – 2023-02-18 (×6): 40 mg via ORAL
  Filled 2023-02-12 (×6): qty 1

## 2023-02-12 MED ORDER — SODIUM CHLORIDE 0.45 % IV SOLN: INTRAVENOUS | Status: DC

## 2023-02-12 MED ORDER — SEMAGLUTIDE (1 MG/DOSE) 2 MG/1.5ML ~~LOC~~ SOPN: 2.0000 mg | PEN_INJECTOR | SUBCUTANEOUS | Status: DC

## 2023-02-12 MED ORDER — ALENDRONATE SODIUM 70 MG PO TABS: 70.0000 mg | ORAL_TABLET | ORAL | Status: DC

## 2023-02-12 MED ORDER — POTASSIUM CHLORIDE CRYS ER 20 MEQ PO TBCR
40.0000 meq | EXTENDED_RELEASE_TABLET | Freq: Once | ORAL | Status: AC
  Administered 2023-02-12: 40 meq via ORAL
  Filled 2023-02-12: qty 2

## 2023-02-12 MED ORDER — ROSUVASTATIN CALCIUM 20 MG PO TABS
20.0000 mg | ORAL_TABLET | Freq: Every day | ORAL | Status: DC
  Administered 2023-02-13 – 2023-02-23 (×10): 20 mg via ORAL
  Filled 2023-02-12 (×10): qty 1

## 2023-02-12 MED ORDER — METOCLOPRAMIDE HCL 5 MG/ML IJ SOLN
10.0000 mg | Freq: Once | INTRAMUSCULAR | Status: AC
  Administered 2023-02-12: 10 mg via INTRAVENOUS
  Filled 2023-02-12: qty 2

## 2023-02-12 MED ORDER — LISINOPRIL 10 MG PO TABS
10.0000 mg | ORAL_TABLET | Freq: Every day | ORAL | Status: DC
  Administered 2023-02-13 – 2023-02-18 (×6): 10 mg via ORAL
  Filled 2023-02-12 (×6): qty 1

## 2023-02-12 MED ORDER — ACETAMINOPHEN 325 MG PO TABS
650.0000 mg | ORAL_TABLET | ORAL | Status: DC | PRN
  Administered 2023-02-16: 650 mg via ORAL
  Filled 2023-02-12: qty 2

## 2023-02-12 MED ORDER — HEPARIN BOLUS VIA INFUSION
4000.0000 [IU] | Freq: Once | INTRAVENOUS | Status: AC
  Administered 2023-02-12: 4000 [IU] via INTRAVENOUS

## 2023-02-12 MED ORDER — ROPINIROLE HCL 1 MG PO TABS: 1.0000 mg | ORAL_TABLET | Freq: Every day | ORAL | Status: DC | PRN

## 2023-02-12 MED ORDER — POTASSIUM CHLORIDE CRYS ER 10 MEQ PO TBCR
10.0000 meq | EXTENDED_RELEASE_TABLET | Freq: Two times a day (BID) | ORAL | Status: DC
  Administered 2023-02-13 – 2023-02-18 (×12): 10 meq via ORAL
  Filled 2023-02-12 (×12): qty 1

## 2023-02-12 MED ORDER — ONDANSETRON HCL 4 MG/2ML IJ SOLN
4.0000 mg | Freq: Four times a day (QID) | INTRAMUSCULAR | Status: DC | PRN
  Administered 2023-02-12 – 2023-02-18 (×4): 4 mg via INTRAVENOUS
  Filled 2023-02-12 (×4): qty 2

## 2023-02-12 NOTE — ED Triage Notes (Signed)
Pt reports onset of vomiting today while she was with her daughter because her grandchild passed away.  Pt reports after vomiting for several hours she began to have chest pressure and she had stents placed in January so she wanted to be checked out.

## 2023-02-12 NOTE — Assessment & Plan Note (Signed)
Patient on Novolog at meals, [place] daily - she adjusts her dose and Ozempic weekly. Last POC A1C 02/04/23 7.7%  Plan Will follow with sliding scale only during acute illness.

## 2023-02-12 NOTE — Assessment & Plan Note (Signed)
Patient reports her current symptoms are not like her GERD symptoms.  Plan Continue PPI

## 2023-02-12 NOTE — Assessment & Plan Note (Signed)
BP mildly elevated at time of admission, most likely situational. She has been tolerating her home regimen  Plan Continue home regimen

## 2023-02-12 NOTE — Progress Notes (Signed)
ANTICOAGULATION CONSULT NOTE - Initial Consult  Pharmacy Consult for heparin Indication: chest pain/ACS  Allergies  Allergen Reactions   Iohexol      Desc: pt had syncopal episode with nausea post IV CM late 1990's,  pt has had prednisone prep with heart caths x 2 without problem  kdean 04/16/07, Onset Date: [id number]    Ticlid [Ticlopidine Hcl] Nausea And Vomiting   Jardiance [Empagliflozin] Other (See Comments)    Recurrent UTIs   Metformin And Related Diarrhea   Codeine Nausea And Vomiting and Palpitations    Patient Measurements: Height: 5\' 6"  (167.6 cm) Weight: 65.3 kg (144 lb) IBW/kg (Calculated) : 59.3 Heparin Dosing Weight: 65kg  Vital Signs: Temp: 98.1 F (36.7 C) (05/21 1357) Temp Source: Oral (05/21 1357) BP: 171/84 (05/21 1357)  Labs: Recent Labs    02/12/23 1442  HGB 12.8  HCT 36.8  PLT 184  CREATININE 1.11*  TROPONINIHS 21*    Estimated Creatinine Clearance: 41.6 mL/min (A) (by C-G formula based on SCr of 1.11 mg/dL (H)).   Medical History: Past Medical History:  Diagnosis Date   Anxiety    CAD (coronary artery disease)    DES to circumflex 02/2007, BMS to LAD and PTCA diagonal 03/2007   Carotid artery plaque    Mild   Cataract    Depression    Diverticulitis, colon    Elevated d-dimer 01/08/2014   Essential hypertension, benign    GERD (gastroesophageal reflux disease)    H/O hiatal hernia    HLD (hyperlipidemia)    IDDM (insulin dependent diabetes mellitus)    Migraine    "used to have them really bad; don't have them anymore" (01/07/2014)   MS (multiple sclerosis) (HCC)    Not confirmed   PAT (paroxysmal atrial tachycardia)    Prolapse of uterus    PVD (peripheral vascular disease) (HCC)    TIA (transient ischemic attack) 1980's    Assessment: 72 year old female presents to ED with chest pain, nausea, and vomiting. Patient does have history of CAD s/p stents. CBC within normal limits.   Goal of Therapy:  Heparin level 0.3-0.7  units/ml Monitor platelets by anticoagulation protocol: Yes   Plan:  Give 4000 units bolus x 1 Start heparin infusion at 900 units/hr Check anti-Xa level in 8 hours and daily while on heparin Continue to monitor H&H and platelets  Sheppard Coil PharmD., BCPS Clinical Pharmacist 02/12/2023 4:02 PM

## 2023-02-12 NOTE — Assessment & Plan Note (Signed)
Patient appears calm and reasonable.  Plan Continue trazadon

## 2023-02-12 NOTE — Telephone Encounter (Signed)
Pt's daughter called in stating EMS was at the house for pt because pt has had persistant headache with nausea and vomiting since yesterday with BP reading of 190/70. EMS did not do EKG as pt isn't complaining of chest pain or tingling in extremities. I asked what her heart rate was but daughter was unsure and EMS left due to pt declining to go to ED. Pt wanted to take extra BP pill to see if that would help so I personally spoke with Kari Baars (covering for PCP) and she advised pt not to take extra BP pill and to go to ED for further evaluation so I advised pt of this and how important it is and pt states she will go to [facility] ED for evaluation.

## 2023-02-12 NOTE — Subjective & Objective (Signed)
[name], a 72 y/o with medical h/o DM-on insulin,HLD, GERD and CAD. Her most recent cardiac history indicated PCI July 2023 for restenosis LAD proximal to previous stent; PCY Jan 2024 for unstable angina with PCI for instent stenosis of LAD. Last Cardiology OV note 11/21/22 she was stable. On the day of admission she experienced N/V and chest pain similar to previous episodes of angina. EMS activated. In route to [facility] she was given ASA and sl NTG.

## 2023-02-12 NOTE — ED Provider Notes (Addendum)
[name] Provider Note   CSN: [id number] Arrival date & time: 02/12/23  1345     History  Chief Complaint  Patient presents with   Emesis    [name] is a 72 y.o. female presenting for evaluation of nausea and vomiting associated with chest pressure which started around 1130 today.  She was visiting her daughter, unfortunately grieving the loss of patient's grandchild 2 days ago.  She was sitting on their deck when she became nauseated and had emesis x 1.  Shortly after she developed left-sided chest pressure which reminds her of her prior symptoms for which she has required stenting.  She underwent stenting July 2023, then January 2024 had a repeat cath during which time a prior stent was found to be occluded.  She has received ASA 324 prior to arrival and 1 nitroglycerin tablet which did not alleviate her symptoms.  Nausea at this time is waxing and waning.  She denies shortness of breath diaphoresis, palpitations, peripheral edema.  The history is provided by the patient.       Home Medications Prior to Admission medications   Medication Sig Start Date End Date Taking? Authorizing Provider  alendronate (FOSAMAX) 70 MG tablet Take 1 tablet (70 mg total) by mouth every 7 (seven) days. Take with a full glass of water on an empty stomach. 02/06/23  Yes Gabriel Earing, FNP  aspirin 81 MG chewable tablet Chew 1 tablet (81 mg total) by mouth daily. 03/28/22  Yes Rai, Ripudeep K, MD  atenolol (TENORMIN) 50 MG tablet Take 1 tablet (50 mg total) by mouth 2 (two) times daily. 05/25/22  Yes Gabriel Earing, FNP  clopidogrel (PLAVIX) 75 MG tablet Take 1 tablet (75 mg total) by mouth daily with breakfast. 03/28/22  Yes Rai, Ripudeep K, MD  diphenhydrAMINE (BENADRYL) 25 mg capsule Take 25 mg by mouth every 6 (six) hours as needed for allergies.   Yes [provider]  furosemide (LASIX) 40 MG tablet Take 1 tablet (40 mg total) by mouth  daily. 04/12/22  Yes Gabriel Earing, FNP  insulin aspart (NOVOLOG FLEXPEN) 100 UNIT/ML FlexPen Inject 36 Units into the skin 3 (three) times daily with meals. Patient taking differently: Inject 25-36 Units into the skin 3 (three) times daily with meals. 10/06/22  Yes Vann, Jessica U, DO  insulin degludec (TRESIBA FLEXTOUCH) 100 UNIT/ML FlexTouch Pen Inject 60 Units into the skin daily.   Yes [provider]  isosorbide mononitrate (IMDUR) 120 MG 24 hr tablet Take 1 tablet (120 mg total) by mouth daily. 10/17/22  Yes Rollene Rotunda, MD  lisinopril (ZESTRIL) 10 MG tablet Take 1 tablet (10 mg total) by mouth daily. 11/21/22  Yes Hochrein, Fayrene Fearing, MD  nitroGLYCERIN (NITROSTAT) 0.4 MG SL tablet DISSOLVE 1 TABLET UNDER TONGUE FOR CHESTPAIN.MAY REPEAT EVERY 5 MINUTES FOR 3 DOSES.IF NO RELIEF CALL 911 OR GO TO ER 03/27/22  Yes Rai, Ripudeep K, MD  rOPINIRole (REQUIP) 0.5 MG tablet Take 2 tablets (1 mg total) by mouth daily as needed (restless leg). 10/06/22  Yes Vann, Jessica U, DO  rosuvastatin (CRESTOR) 20 MG tablet TAKE ONE (1) TABLET BY MOUTH EVERY DAY 10/29/22  Yes Gabriel Earing, FNP  Semaglutide, 1 MG/DOSE, (OZEMPIC, 1 MG/DOSE,) 2 MG/1.5ML SOPN Inject 2 mg into the skin once a week.   Yes [provider]  traZODone (DESYREL) 100 MG tablet Take 1 tablet (100 mg total) by mouth at bedtime as needed for  sleep. 01/15/22  Yes Gabriel Earing, FNP  cephALEXin (KEFLEX) 500 MG capsule Take 1 capsule (500 mg total) by mouth 2 (two) times daily. Patient not taking: Reported on 02/12/2023 02/04/23   Gabriel Earing, FNP  Continuous Blood Gluc Sensor (FREESTYLE LIBRE 3 SENSOR) MISC Place 1 sensor on the skin every 14 days. Use to check glucose continuously. DX:e11.65 11/30/22   Jannifer Rodney A, FNP  potassium chloride (KLOR-CON M) 10 MEQ tablet Take 1 tablet (10 mEq total) by mouth 2 (two) times daily. Patient not taking: Reported on 02/12/2023 06/21/22   Gabriel Earing, FNP      Allergies     Iohexol, Ticlid [ticlopidine hcl], Jardiance [empagliflozin], Metformin and related, and Codeine    Review of Systems   Review of Systems  Constitutional:  Negative for chills and fever.  HENT:  Negative for congestion and sore throat.   Eyes: Negative.   Respiratory:  Negative for chest tightness and shortness of breath.   Cardiovascular:  Positive for chest pain. Negative for palpitations and leg swelling.  Gastrointestinal:  Positive for nausea and vomiting. Negative for abdominal pain.  Genitourinary: Negative.   Musculoskeletal:  Negative for arthralgias, joint swelling and neck pain.  Skin: Negative.  Negative for rash and wound.  Neurological:  Negative for dizziness, weakness, light-headedness, numbness and headaches.  Psychiatric/Behavioral: Negative.    All other systems reviewed and are negative.   Physical Exam Updated Vital Signs BP (!) 171/84 (BP Location: Left Arm)   Temp 98.1 F (36.7 C) (Oral)   Resp 18   Ht 5\' 6"  (1.676 m)   Wt 65.3 kg   SpO2 98%   BMI 23.24 kg/m  Physical Exam Vitals and nursing note reviewed.  Constitutional:      Appearance: She is well-developed.  HENT:     Head: Normocephalic and atraumatic.  Eyes:     Conjunctiva/sclera: Conjunctivae normal.  Cardiovascular:     Rate and Rhythm: Normal rate and regular rhythm.     Heart sounds: Normal heart sounds.  Pulmonary:     Effort: Pulmonary effort is normal.     Breath sounds: Normal breath sounds. No wheezing.  Abdominal:     General: Bowel sounds are normal.     Palpations: Abdomen is soft.     Tenderness: There is no abdominal tenderness.  Musculoskeletal:        General: Normal range of motion.     Cervical back: Normal range of motion.  Skin:    General: Skin is warm and dry.  Neurological:     Mental Status: She is alert.     ED Results / Procedures / Treatments   Labs (all labs ordered are listed, but only abnormal results are displayed) Labs Reviewed  BASIC  METABOLIC PANEL - Abnormal; Notable for the following components:      Result Value   Sodium 134 (*)    Potassium 3.0 (*)    Chloride 95 (*)    Glucose, Bld 232 (*)    Creatinine, Ser 1.11 (*)    GFR, Estimated 52 (*)    All other components within normal limits  TROPONIN I (HIGH SENSITIVITY) - Abnormal; Notable for the following components:   Troponin I (High Sensitivity) 21 (*)    All other components within normal limits  TROPONIN I (HIGH SENSITIVITY) - Abnormal; Notable for the following components:   Troponin I (High Sensitivity) 21 (*)    All other components within normal limits  CBC  HEPARIN LEVEL (UNFRACTIONATED)  CBC    EKG EKG Interpretation  Date/Time:  Tuesday Feb 12 2023 14:29:52 EDT Ventricular Rate:  89 PR Interval:  207 QRS Duration: 86 QT Interval:  396 QTC Calculation: 482 R Axis:   -39 Text Interpretation: Sinus rhythm Left axis deviation Anteroseptal infarct, age indeterminate Confirmed by Alvino Blood ([id number]) on 02/12/2023 3:57:53 PM  Radiology DG Chest Port 1 View  Result Date: 02/12/2023 CLINICAL DATA:  Nausea and vomiting associated with chest pressure EXAM: PORTABLE CHEST 1 VIEW COMPARISON:  Chest radiograph dated 10/04/2022 FINDINGS: Normal lung volumes. No focal consolidations. Rounded lucency projecting above the medial left hemidiaphragm. No pleural effusion or pneumothorax. Similar cardiomediastinal silhouette. No acute osseous abnormality. IMPRESSION: 1.  No focal consolidations. 2. Rounded lucency projecting above the medial left hemidiaphragm may represent a hiatal hernia. Electronically Signed   By: Agustin Cree M.D.   On: 02/12/2023 14:43    Procedures Procedures    Medications Ordered in ED Medications  nitroGLYCERIN (NITROSTAT) SL tablet 0.4 mg (0.4 mg Sublingual Given 02/12/23 1546)  heparin ADULT infusion 100 units/mL (25000 units/273mL) (900 Units/hr Intravenous New Bag/Given 02/12/23 1631)  aspirin chewable tablet 81 mg (has no  administration in time range)  atenolol (TENORMIN) tablet 50 mg (has no administration in time range)  furosemide (LASIX) tablet 40 mg (has no administration in time range)  isosorbide mononitrate (IMDUR) 24 hr tablet 120 mg (has no administration in time range)  lisinopril (ZESTRIL) tablet 10 mg (has no administration in time range)  rosuvastatin (CRESTOR) tablet 20 mg (has no administration in time range)  traZODone (DESYREL) tablet 100 mg (has no administration in time range)  clopidogrel (PLAVIX) tablet 75 mg (has no administration in time range)  rOPINIRole (REQUIP) tablet 1 mg (has no administration in time range)  potassium chloride SA (KLOR-CON M) CR tablet 10 mEq (10 mEq Oral Not Given 02/12/23 1812)  metoCLOPramide (REGLAN) injection 10 mg (10 mg Intravenous Given 02/12/23 1443)  morphine (PF) 4 MG/ML injection 4 mg (4 mg Intravenous Given 02/12/23 1621)  heparin bolus via infusion 4,000 Units (4,000 Units Intravenous Bolus from Bag 02/12/23 1632)  potassium chloride SA (KLOR-CON M) CR tablet 40 mEq (40 mEq Oral Given 02/12/23 1619)    ED Course/ Medical Decision Making/ A&P                             Medical Decision Making Pt presenting with chest pressure and n/v similar with prior ACS episodes, occurring during emotionally stressful event this am.  Initial troponin is 21, pending delta troponin.  Given sl ntg x 3, (1st per ems), improved pain sx.  ASA prior to arrival.  Morphine given for ntg induced headache.   Amount and/or Complexity of Data Reviewed Labs: ordered.    Details: Significant labs includes a potassium of 3.0, she was given oral replacement, she is a diabetic with a blood sugar of 232 today, there is no anion gap present.  Her delta troponins are flat at 21. Radiology: ordered.    Details: Chest x-ray with no active disease.  Jesme possible hiatal hernia. ECG/medicine tests: ordered and independent interpretation performed.    Details: Rate 89, sinus rhythm,  left axis deviation age-indeterminate anteroseptal infarct. Discussion of management or test interpretation with external provider(s): Discussed with Dr. Tenny Craw, cardiologist including presentation, past hx,  initial mild elevated troponin.  Recommends hospitalist admission with heparin tx and following troponins.  Can stay  at [facility].   Discussed with Dr. Debby Bud who accepts patient for admission.  Risk Decision regarding hospitalization.           Final Clinical Impression(s) / ED Diagnoses Final diagnoses:  Chest pain, unspecified type    Rx / DC Orders ED Discharge Orders     None         Victoriano Lain 02/12/23 1429    Burgess Amor, PA-C 02/12/23 1839    Kommor, Wyn Forster, MD 02/12/23 2115

## 2023-02-12 NOTE — Assessment & Plan Note (Signed)
Patient with h/o obstructive CAD s/p Stenting x 4, last Jan 2024. She reports that her symptoms are just like previous episodes of chest pressure which was due to recurrent LAD stenosis or instent stenosis. She is hemodynamically stable but has persistent chest pressure and N/V.  Plan Admit to cardiac telemetry [facility]  Continue heparin qtt  Continue cardiac meds - will hold plavix.  Clear liquid diet  Cardiology consult.

## 2023-02-12 NOTE — H&P (Signed)
History and Physical    [name] ZOX:[id number] DOB: 10-24-1950 DOA: 02/12/2023  DOS: the patient was seen and examined on 02/12/2023  PCP: Gabriel Earing, FNP   Patient coming from: Home  I have personally briefly reviewed patient's old medical records in [facility]  [name], a 72 y/o with medical h/o DM-on insulin,HLD, GERD and CAD. Her most recent cardiac history indicated PCI July 2023 for restenosis LAD proximal to previous stent; PCY Jan 2024 for unstable angina with PCI for instent stenosis of LAD. Last Cardiology OV note 11/21/22 she was stable. On the day of admission she experienced N/V and chest pain similar to previous episodes of angina. EMS activated. In route to [facility] she was given ASA and sl NTG.    ED Course: T 98.1  171/84  RR 18. Patient uncomfortable per EDP but hemodynamically stable. Lab K 3.0  CBC nl, Troponin 21, 21. EKG w/o STEMI or acute changes. Last ECHO 10/05/22- EF 60-65%, mild MoV regurg. TRH called to admit for continued management of unstable angina/ACS  Review of Systems:  Review of Systems  Constitutional:  Negative for chills, fever and weight loss.  HENT: Negative.    Eyes: Negative.   Respiratory: Negative.    Cardiovascular:  Positive for chest pain. Negative for orthopnea and leg swelling.  Gastrointestinal:  Positive for nausea and vomiting. Negative for abdominal pain.  Genitourinary: Negative.   Musculoskeletal: Negative.   Skin: Negative.   Neurological: Negative.   Endo/Heme/Allergies: Negative.   Psychiatric/Behavioral: Negative.      Past Medical History:  Diagnosis Date   Anxiety    CAD (coronary artery disease)    DES to circumflex 02/2007, BMS to LAD and PTCA diagonal 03/2007   Carotid artery plaque    Mild   Cataract    Depression    Diverticulitis, colon    Elevated d-dimer 01/08/2014   Essential hypertension, benign    GERD (gastroesophageal reflux disease)    H/O hiatal hernia    HLD (hyperlipidemia)     IDDM (insulin dependent diabetes mellitus)    Migraine    "used to have them really bad; don't have them anymore" (01/07/2014)   MS (multiple sclerosis) (HCC)    Not confirmed   PAT (paroxysmal atrial tachycardia)    Prolapse of uterus    PVD (peripheral vascular disease) (HCC)    TIA (transient ischemic attack) 1980's    Past Surgical History:  Procedure Laterality Date   ABDOMINAL HYSTERECTOMY  1986   ovaries remain - prolaspe uterus    APPENDECTOMY  ~ 1970   BREAST BIOPSY Right 1980's   BREAST LUMPECTOMY Right 1980's   Dr. Luan Moore    CARDIAC CATHETERIZATION  01/07/2014   CHOLECYSTECTOMY  ?1987   COLONOSCOPY  2002   Dr. Anwar--> Severe diverticular changes in the region of the sigmoid and descending colon with scattered diverticular changes throughout the rest of the colon. No polyps, ulcerations. Despite numerous manipulations, the tip of the scope could not be tipped into the cecal area.   COLONOSCOPY  01/10/2012   Procedure: COLONOSCOPY;  Surgeon: Corbin Ade, MD;  [facility];  Service: Endoscopy;  Laterality: N/A;  1:55   CORONARY ANGIOPLASTY WITH STENT PLACEMENT  ~ 1997 X 2   "2 + 1"   CORONARY BALLOON ANGIOPLASTY N/A 10/05/2022   Procedure: CORONARY BALLOON ANGIOPLASTY;  Surgeon: Tonny Bollman, MD;  Location: [facility] INVASIVE CV LAB;  Service: Cardiovascular;  Laterality: N/A;  CORONARY PRESSURE/FFR STUDY N/A 03/08/2017   Procedure: Intravascular Pressure Wire/FFR Study;  Surgeon: Yvonne Kendall, MD;  Location: [facility] INVASIVE CV LAB;  Service: Cardiovascular;  Laterality: N/A;   CORONARY STENT INTERVENTION N/A 03/26/2022   Procedure: CORONARY STENT INTERVENTION;  Surgeon: Runell Gess, MD;  Location: [facility] INVASIVE CV LAB;  Service: Cardiovascular;  Laterality: N/A;   EYE SURGERY Bilateral 2014   cataract   LEFT HEART CATH AND CORONARY ANGIOGRAPHY N/A 03/08/2017   Procedure: Left Heart Cath and Coronary Angiography;  Surgeon: Yvonne Kendall, MD;  Location: [facility]  INVASIVE CV LAB;  Service: Cardiovascular;  Laterality: N/A;   LEFT HEART CATH AND CORONARY ANGIOGRAPHY N/A 03/26/2022   Procedure: LEFT HEART CATH AND CORONARY ANGIOGRAPHY;  Surgeon: Runell Gess, MD;  Location: [facility] INVASIVE CV LAB;  Service: Cardiovascular;  Laterality: N/A;   LEFT HEART CATH AND CORONARY ANGIOGRAPHY N/A 10/05/2022   Procedure: LEFT HEART CATH AND CORONARY ANGIOGRAPHY;  Surgeon: Tonny Bollman, MD;  Location: [facility] INVASIVE CV LAB;  Service: Cardiovascular;  Laterality: N/A;   LEFT HEART CATHETERIZATION WITH CORONARY ANGIOGRAM N/A 01/07/2014   Procedure: LEFT HEART CATHETERIZATION WITH CORONARY ANGIOGRAM;  Surgeon: Laurey Morale, MD;  Location[facility] CATH LAB;  Service: Cardiovascular;  Laterality: N/A;    Soc Hx - widowed x 7 years after >50 years of marriage. She has a large family - children, grands and great-grands.   reports that she has never smoked. She has never used smokeless tobacco. She reports that she does not drink alcohol and does not use drugs.  Allergies  Allergen Reactions   Iohexol      Desc: pt had syncopal episode with nausea post IV CM late 1990's,  pt has had prednisone prep with heart caths x 2 without problem  kdean 04/16/07, Onset Date: [id number]    Ticlid [Ticlopidine Hcl] Nausea And Vomiting   Jardiance [Empagliflozin] Other (See Comments)    Recurrent UTIs   Metformin And Related Diarrhea   Codeine Nausea And Vomiting and Palpitations    Family History  Problem Relation Age of Onset   Heart attack Mother 28   Diabetes Mother    Hypertension Mother    Heart attack Father 1   Heart attack Brother 32       x 6   Heart disease Brother    Diabetes Brother    Colon cancer Paternal Aunt        53s, died with brain anuerysm   Crohn's disease Cousin        paternal   Diabetes Sister    GER disease Daughter    Cervical cancer Daughter    Diabetes Daughter     Prior to Admission medications   Medication Sig Start Date End Date Taking?  Authorizing Provider  alendronate (FOSAMAX) 70 MG tablet Take 1 tablet (70 mg total) by mouth every 7 (seven) days. Take with a full glass of water on an empty stomach. 02/06/23  Yes Gabriel Earing, FNP  aspirin 81 MG chewable tablet Chew 1 tablet (81 mg total) by mouth daily. 03/28/22  Yes Rai, Ripudeep K, MD  atenolol (TENORMIN) 50 MG tablet Take 1 tablet (50 mg total) by mouth 2 (two) times daily. 05/25/22  Yes Gabriel Earing, FNP  clopidogrel (PLAVIX) 75 MG tablet Take 1 tablet (75 mg total) by mouth daily with breakfast. 03/28/22  Yes Rai, Ripudeep K, MD  diphenhydrAMINE (BENADRYL) 25 mg capsule Take 25 mg by mouth every 6 (six) hours as needed  for allergies.   Yes [provider]  furosemide (LASIX) 40 MG tablet Take 1 tablet (40 mg total) by mouth daily. 04/12/22  Yes Gabriel Earing, FNP  insulin aspart (NOVOLOG FLEXPEN) 100 UNIT/ML FlexPen Inject 36 Units into the skin 3 (three) times daily with meals. Patient taking differently: Inject 25-36 Units into the skin 3 (three) times daily with meals. 10/06/22  Yes Vann, Jessica U, DO  insulin degludec (TRESIBA FLEXTOUCH) 100 UNIT/ML FlexTouch Pen Inject 60 Units into the skin daily.   Yes [provider]  isosorbide mononitrate (IMDUR) 120 MG 24 hr tablet Take 1 tablet (120 mg total) by mouth daily. 10/17/22  Yes Rollene Rotunda, MD  lisinopril (ZESTRIL) 10 MG tablet Take 1 tablet (10 mg total) by mouth daily. 11/21/22  Yes Hochrein, Fayrene Fearing, MD  nitroGLYCERIN (NITROSTAT) 0.4 MG SL tablet DISSOLVE 1 TABLET UNDER TONGUE FOR CHESTPAIN.MAY REPEAT EVERY 5 MINUTES FOR 3 DOSES.IF NO RELIEF CALL 911 OR GO TO ER 03/27/22  Yes Rai, Ripudeep K, MD  rOPINIRole (REQUIP) 0.5 MG tablet Take 2 tablets (1 mg total) by mouth daily as needed (restless leg). 10/06/22  Yes Vann, Jessica U, DO  rosuvastatin (CRESTOR) 20 MG tablet TAKE ONE (1) TABLET BY MOUTH EVERY DAY 10/29/22  Yes Gabriel Earing, FNP  Semaglutide, 1 MG/DOSE, (OZEMPIC, 1 MG/DOSE,) 2  MG/1.5ML SOPN Inject 2 mg into the skin once a week.   Yes [provider]  traZODone (DESYREL) 100 MG tablet Take 1 tablet (100 mg total) by mouth at bedtime as needed for sleep. 01/15/22  Yes Gabriel Earing, FNP  cephALEXin (KEFLEX) 500 MG capsule Take 1 capsule (500 mg total) by mouth 2 (two) times daily. Patient not taking: Reported on 02/12/2023 02/04/23   Gabriel Earing, FNP  Continuous Blood Gluc Sensor (FREESTYLE LIBRE 3 SENSOR) MISC Place 1 sensor on the skin every 14 days. Use to check glucose continuously. DX:e11.65 11/30/22   Jannifer Rodney A, FNP  potassium chloride (KLOR-CON M) 10 MEQ tablet Take 1 tablet (10 mEq total) by mouth 2 (two) times daily. Patient not taking: Reported on 02/12/2023 06/21/22   Gabriel Earing, FNP    Physical Exam: Vitals:   02/12/23 1357 02/12/23 1400  BP: (!) 171/84   Resp: 18   Temp: 98.1 F (36.7 C)   TempSrc: Oral   SpO2: 98%   Weight:  65.3 kg  Height:  5\' 6"  (1.676 m)    Physical Exam Vitals and nursing note reviewed.  Constitutional:      General: She is in acute distress.     Appearance: Normal appearance. She is obese.     Comments: Nausea and vomiting. Continued chest pressure  HENT:     Head: Normocephalic and atraumatic.     Mouth/Throat:     Mouth: Mucous membranes are moist.     Pharynx: Oropharynx is clear. No oropharyngeal exudate.  Eyes:     Extraocular Movements: Extraocular movements intact.     Conjunctiva/sclera: Conjunctivae normal.     Pupils: Pupils are equal, round, and reactive to light.  Cardiovascular:     Rate and Rhythm: Normal rate and regular rhythm.     Pulses: Normal pulses.     Heart sounds: Normal heart sounds. No murmur heard. Pulmonary:     Effort: Pulmonary effort is normal. No respiratory distress.     Breath sounds: Normal breath sounds. No rales.  Abdominal:     Palpations: Abdomen is soft.  Musculoskeletal:  General: Normal range of motion.     Cervical back: Normal  range of motion and neck supple.  Skin:    General: Skin is warm and dry.  Neurological:     General: No focal deficit present.     Mental Status: She is alert and oriented to person, place, and time.     Cranial Nerves: No cranial nerve deficit.  Psychiatric:        Mood and Affect: Mood normal.        Behavior: Behavior normal.      Labs on Admission: I have personally reviewed following labs and imaging studies  CBC: Recent Labs  Lab 02/12/23 1442  WBC 8.7  HGB 12.8  HCT 36.8  MCV 85.4  PLT 184   Basic Metabolic Panel: Recent Labs  Lab 02/12/23 1442  NA 134*  K 3.0*  CL 95*  CO2 27  GLUCOSE 232*  BUN 13  CREATININE 1.11*  CALCIUM 9.1   GFR: Estimated Creatinine Clearance: 41.6 mL/min (A) (by C-G formula based on SCr of 1.11 mg/dL (H)). Liver Function Tests: No results for input(s): "AST", "ALT", "ALKPHOS", "BILITOT", "PROT", "ALBUMIN" in the last 168 hours. No results for input(s): "LIPASE", "AMYLASE" in the last 168 hours. No results for input(s): "AMMONIA" in the last 168 hours. Coagulation Profile: No results for input(s): "INR", "PROTIME" in the last 168 hours. Cardiac Enzymes: No results for input(s): "CKTOTAL", "CKMB", "CKMBINDEX", "TROPONINI" in the last 168 hours. BNP (last 3 results) No results for input(s): "PROBNP" in the last 8760 hours. HbA1C: No results for input(s): "HGBA1C" in the last 72 hours. CBG: No results for input(s): "GLUCAP" in the last 168 hours. Lipid Profile: No results for input(s): "CHOL", "HDL", "LDLCALC", "TRIG", "CHOLHDL", "LDLDIRECT" in the last 72 hours. Thyroid Function Tests: No results for input(s): "TSH", "T4TOTAL", "FREET4", "T3FREE", "THYROIDAB" in the last 72 hours. Anemia Panel: No results for input(s): "VITAMINB12", "FOLATE", "FERRITIN", "TIBC", "IRON", "RETICCTPCT" in the last 72 hours. Urine analysis:    Component Value Date/Time   COLORURINE YELLOW 03/26/2022 0824   APPEARANCEUR Clear 02/04/2023 0826    LABSPEC 1.030 03/26/2022 0824   PHURINE 6.0 03/26/2022 0824   GLUCOSEU 1+ (A) 02/04/2023 0826   HGBUR MODERATE (A) 03/26/2022 0824   BILIRUBINUR Negative 02/04/2023 0826   KETONESUR 20 (A) 03/26/2022 0824   PROTEINUR Negative 02/04/2023 0826   PROTEINUR 30 (A) 03/26/2022 0824   UROBILINOGEN negative 10/11/2014 1109   UROBILINOGEN 2.0 (H) 08/14/2014 1157   NITRITE Negative 02/04/2023 0826   NITRITE NEGATIVE 03/26/2022 0824   LEUKOCYTESUR 3+ (A) 02/04/2023 0826   LEUKOCYTESUR NEGATIVE 03/26/2022 0824    Radiological Exams on Admission: I have personally reviewed images DG Chest Port 1 View  Result Date: 02/12/2023 CLINICAL DATA:  Nausea and vomiting associated with chest pressure EXAM: PORTABLE CHEST 1 VIEW COMPARISON:  Chest radiograph dated 10/04/2022 FINDINGS: Normal lung volumes. No focal consolidations. Rounded lucency projecting above the medial left hemidiaphragm. No pleural effusion or pneumothorax. Similar cardiomediastinal silhouette. No acute osseous abnormality. IMPRESSION: 1.  No focal consolidations. 2. Rounded lucency projecting above the medial left hemidiaphragm may represent a hiatal hernia. Electronically Signed   By: Agustin Cree M.D.   On: 02/12/2023 14:43    EKG: I have personally reviewed EKG: NSR, LAD, no acute changes, no STEMI, old anteroseptal injury  Assessment/Plan Principal Problem:   Unstable angina (HCC) Active Problems:   Coronary artery disease involving native coronary artery of native heart with unstable angina pectoris (HCC)  Hypertension associated with diabetes (HCC)   Hyperlipidemia associated with type 2 diabetes mellitus (HCC)   Type 2 diabetes mellitus with hyperglycemia (HCC)   Gastroesophageal reflux disease without esophagitis   Generalized anxiety disorder    Assessment and Plan: Coronary artery disease involving native coronary artery of native heart with unstable angina pectoris [facility]) Patient with h/o obstructive CAD s/p Stenting x 4,  last Jan 2024. She reports that her symptoms are just like previous episodes of chest pressure which was due to recurrent LAD stenosis or instent stenosis. She is hemodynamically stable but has persistent chest pressure and N/V.  Plan Admit to cardiac telemetry [facility]  Continue heparin qtt  Continue cardiac meds - will hold plavix.  Clear liquid diet  Cardiology consult.  Hypertension associated with diabetes (HCC) BP mildly elevated at time of admission, most likely situational. She has been tolerating her home regimen  Plan Continue home regimen  Generalized anxiety disorder Patient appears calm and reasonable.  Plan Continue trazadon  Gastroesophageal reflux disease without esophagitis Patient reports her current symptoms are not like her GERD symptoms.  Plan Continue PPI  Type 2 diabetes mellitus with hyperglycemia (HCC) Patient on Novolog at meals, [place] daily - she adjusts her dose and Ozempic weekly. Last POC A1C 02/04/23 7.7%  Plan Will follow with sliding scale only during acute illness.  Hyperlipidemia associated with type 2 diabetes mellitus (HCC) Last lipid panel Dec '2023. She was controlled.  Plan Continue home medications       DVT prophylaxis: IV heparin gtts Code Status: Full Code discussed with patient. She has some ambivalence but wished full code status at this time Family Communication: spoke with Sterling Big - son. He understands Dx and Tx plan including transfer to [facility].   Disposition Plan: TBD  Consults called: Cardiology - [facility] - Dr. Anne Fu notified of transfer  Admission status: Inpatient, Telemetry bed   Illene Regulus, MD [facility] 02/12/2023, 6:50 PM

## 2023-02-12 NOTE — Assessment & Plan Note (Signed)
Last lipid panel Dec '2023. She was controlled.  Plan Continue home medications

## 2023-02-13 ENCOUNTER — Other Ambulatory Visit (HOSPITAL_COMMUNITY): Payer: PPO

## 2023-02-13 ENCOUNTER — Encounter (HOSPITAL_COMMUNITY)
Admission: EM | Disposition: A | Discharge status: Home / Self Care | Payer: Self-pay | Source: Home / Self Care | Attending: Family Medicine

## 2023-02-13 DIAGNOSIS — I200 Unstable angina: Secondary | ICD-10-CM | POA: Diagnosis not present

## 2023-02-13 LAB — BASIC METABOLIC PANEL
Anion gap: 17 — ABNORMAL HIGH (ref 5–15)
BUN: 19 mg/dL (ref 8–23)
CO2: 25 mmol/L (ref 22–32)
Calcium: 9 mg/dL (ref 8.9–10.3)
Chloride: 92 mmol/L — ABNORMAL LOW (ref 98–111)
Creatinine, Ser: 1.24 mg/dL — ABNORMAL HIGH (ref 0.44–1.00)
GFR, Estimated: 46 mL/min — ABNORMAL LOW (ref >60)
Glucose, Bld: 354 mg/dL — ABNORMAL HIGH (ref 70–99)
Potassium: 3.4 mmol/L — ABNORMAL LOW (ref 3.5–5.1)
Sodium: 134 mmol/L — ABNORMAL LOW (ref 135–145)

## 2023-02-13 LAB — LIPID PANEL
Cholesterol: 105 mg/dL (ref 0–200)
HDL: 39 mg/dL — ABNORMAL LOW (ref >40)
LDL Cholesterol: 50 mg/dL (ref 0–99)
Total CHOL/HDL Ratio: 2.7 RATIO
Triglycerides: 81 mg/dL (ref <150)
VLDL: 16 mg/dL (ref 0–40)

## 2023-02-13 LAB — CBC
HCT: 34.6 % — ABNORMAL LOW (ref 36.0–46.0)
Hemoglobin: 12 g/dL (ref 12.0–15.0)
MCH: 29.7 pg (ref 26.0–34.0)
MCHC: 34.7 g/dL (ref 30.0–36.0)
MCV: 85.6 fL (ref 80.0–100.0)
Platelets: 188 10*3/uL (ref 150–400)
RBC: 4.04 MIL/uL (ref 3.87–5.11)
RDW: 13.5 % (ref 11.5–15.5)
WBC: 10 10*3/uL (ref 4.0–10.5)
nRBC: 0 % (ref 0.0–0.2)

## 2023-02-13 LAB — HEMOGLOBIN A1C
Hgb A1c MFr Bld: 7.8 % — ABNORMAL HIGH (ref 4.8–5.6)
Mean Plasma Glucose: 177.16 mg/dL

## 2023-02-13 LAB — GLUCOSE, CAPILLARY
Glucose-Capillary: 318 mg/dL — ABNORMAL HIGH (ref 70–99)
Glucose-Capillary: 295 mg/dL — ABNORMAL HIGH (ref 70–99)
Glucose-Capillary: 278 mg/dL — ABNORMAL HIGH (ref 70–99)
Glucose-Capillary: 136 mg/dL — ABNORMAL HIGH (ref 70–99)

## 2023-02-13 LAB — HEPARIN LEVEL (UNFRACTIONATED)
Heparin Unfractionated: 0.85 IU/mL — ABNORMAL HIGH (ref 0.30–0.70)
Heparin Unfractionated: 0.49 IU/mL (ref 0.30–0.70)

## 2023-02-13 SURGERY — LEFT HEART CATH AND CORONARY ANGIOGRAPHY
Anesthesia: LOCAL

## 2023-02-13 MED ORDER — AMLODIPINE BESYLATE 5 MG PO TABS
2.5000 mg | ORAL_TABLET | Freq: Every day | ORAL | Status: DC
  Administered 2023-02-13 – 2023-02-18 (×6): 2.5 mg via ORAL
  Filled 2023-02-13 (×6): qty 1

## 2023-02-13 MED ORDER — CLOPIDOGREL BISULFATE 75 MG PO TABS
75.0000 mg | ORAL_TABLET | Freq: Every day | ORAL | Status: DC
  Administered 2023-02-13 – 2023-02-14 (×2): 75 mg via ORAL
  Filled 2023-02-13 (×2): qty 1

## 2023-02-13 MED ORDER — INSULIN GLARGINE-YFGN 100 UNIT/ML ~~LOC~~ SOLN
30.0000 [IU] | Freq: Every day | SUBCUTANEOUS | Status: DC
  Administered 2023-02-13 – 2023-02-15 (×3): 30 [IU] via SUBCUTANEOUS
  Filled 2023-02-13 (×3): qty 0.3

## 2023-02-13 MED ORDER — RANOLAZINE ER 500 MG PO TB12
500.0000 mg | ORAL_TABLET | Freq: Two times a day (BID) | ORAL | Status: DC
  Administered 2023-02-13 – 2023-02-18 (×12): 500 mg via ORAL
  Filled 2023-02-13 (×12): qty 1

## 2023-02-13 MED ORDER — INSULIN ASPART 100 UNIT/ML IJ SOLN
0.0000 [IU] | Freq: Every day | INTRAMUSCULAR | Status: DC
  Administered 2023-02-14: 2 [IU] via SUBCUTANEOUS

## 2023-02-13 MED ORDER — INSULIN ASPART 100 UNIT/ML IJ SOLN
0.0000 [IU] | Freq: Three times a day (TID) | INTRAMUSCULAR | Status: DC
  Administered 2023-02-13: 8 [IU] via SUBCUTANEOUS
  Administered 2023-02-13: 11 [IU] via SUBCUTANEOUS
  Administered 2023-02-14: 8 [IU] via SUBCUTANEOUS
  Administered 2023-02-14: 2 [IU] via SUBCUTANEOUS
  Administered 2023-02-14: 5 [IU] via SUBCUTANEOUS
  Administered 2023-02-15: 3 [IU] via SUBCUTANEOUS
  Administered 2023-02-15: 5 [IU] via SUBCUTANEOUS
  Administered 2023-02-15: 11 [IU] via SUBCUTANEOUS
  Administered 2023-02-16: 5 [IU] via SUBCUTANEOUS
  Administered 2023-02-16: 3 [IU] via SUBCUTANEOUS
  Administered 2023-02-17: 8 [IU] via SUBCUTANEOUS
  Administered 2023-02-17: 3 [IU] via SUBCUTANEOUS
  Administered 2023-02-18: 5 [IU] via SUBCUTANEOUS
  Administered 2023-02-18: 2 [IU] via SUBCUTANEOUS

## 2023-02-13 NOTE — TOC CM/SW Note (Addendum)
Transition of Care [facility]) - Inpatient Brief Assessment   Patient Details  Name: [name] MRN: [id number] Date of Birth: [birth year]  [facility] of Care [facility]) CM/SW Contact:    Leone Haven, RN Phone Number: 02/13/2023, 5:43 PM   Clinical Narrative: From home alone, children are support , and family will transport at dc.  Has insurance and PCP on file.  Please consult TOC for any additional needs.   Transition of Care Asessment: Insurance and Status: Insurance coverage has been reviewed Patient has primary care physician: Yes Home environment has been reviewed: home alone Prior level of function:: indep Prior/Current Home Services: No current home services Social Determinants of Health Reivew: SDOH reviewed no interventions necessary Readmission risk has been reviewed: Yes Transition of care needs: no transition of care needs at this time

## 2023-02-13 NOTE — Progress Notes (Signed)
ANTICOAGULATION CONSULT NOTE - Initial Consult  Pharmacy Consult for heparin Indication: chest pain/ACS  Allergies  Allergen Reactions   Iohexol      Desc: pt had syncopal episode with nausea post IV CM late 1990's,  pt has had prednisone prep with heart caths x 2 without problem  kdean 04/16/07, Onset Date: [id number]    Ticlid [Ticlopidine Hcl] Nausea And Vomiting   Jardiance [Empagliflozin] Other (See Comments)    Recurrent UTIs   Metformin And Related Diarrhea   Codeine Nausea And Vomiting and Palpitations    Patient Measurements: Height: 5\' 6"  (167.6 cm) Weight: 65.8 kg (145 lb 1.6 oz) IBW/kg (Calculated) : 59.3 Heparin Dosing Weight: 65kg  Vital Signs: Temp: 98.8 F (37.1 C) (05/22 1101) Temp Source: Oral (05/22 1101) BP: 146/64 (05/22 1101) Pulse Rate: 83 (05/22 1101)  Labs: Recent Labs    02/12/23 1442 02/12/23 1606 02/12/23 1940 02/12/23 2121 02/13/23 0108 02/13/23 0952  HGB 12.8  --   --   --  12.0  --   HCT 36.8  --   --   --  34.6*  --   PLT 184  --   --   --  188  --   HEPARINUNFRC  --   --   --   --  0.85* 0.49  CREATININE 1.11*  --   --   --   --   --   TROPONINIHS 21* 21* 20* 20*  --   --      Estimated Creatinine Clearance: 41.6 mL/min (A) (by C-G formula based on SCr of 1.11 mg/dL (H)).   Medical History: Past Medical History:  Diagnosis Date   Anxiety    CAD (coronary artery disease)    DES to circumflex 02/2007, BMS to LAD and PTCA diagonal 03/2007   Carotid artery plaque    Mild   Cataract    Depression    Diverticulitis, colon    Elevated d-dimer 01/08/2014   Essential hypertension, benign    GERD (gastroesophageal reflux disease)    H/O hiatal hernia    HLD (hyperlipidemia)    IDDM (insulin dependent diabetes mellitus)    Migraine    "used to have them really bad; don't have them anymore" (01/07/2014)   MS (multiple sclerosis) (HCC)    Not confirmed   PAT (paroxysmal atrial tachycardia)    Prolapse of uterus    PVD (peripheral  vascular disease) (HCC)    TIA (transient ischemic attack) 1980's    Assessment: 72 year old female presents to ED with chest pain, nausea, and vomiting. Patient does have history of CAD s/p stents. CBC within normal limits.   Heparin level came back therapeutic this AM. Plan for cath after ECHO today.   Goal of Therapy:  Heparin level 0.3-0.7 units/ml Monitor platelets by anticoagulation protocol: Yes   Plan:  Continue heparin at 800 units/hr F/u after cath Daily HL/CBC  Ulyses Southward, PharmD, BCIDP, AAHIVP, CPP Infectious Disease Pharmacist 02/13/2023 11:31 AM

## 2023-02-13 NOTE — Progress Notes (Addendum)
PROGRESS NOTE    [name]  YQM:[id number] DOB: 1951-03-04 DOA: 02/12/2023 PCP: Gabriel Earing, FNP   Brief Narrative:  [name], a 72 y/o with medical h/o DM-on insulin,HLD, GERD and CAD. Her most recent cardiac history indicated PCI July 2023 for restenosis LAD proximal to previous stent; PCY Jan 2024 for unstable angina with PCI for instent stenosis of LAD. Last Cardiology OV note 11/21/22 she was stable. On the day of admission she experienced N/V and chest pain similar to previous episodes of angina. EMS activated. In route to [facility] she was given ASA and sl NTG.     ED Course: T 98.1  171/84  RR 18. Patient uncomfortable per EDP but hemodynamically stable. Lab K 3.0  CBC nl, Troponin 21, 21. EKG w/o STEMI or acute changes. Last ECHO 10/05/22- EF 60-65%, mild MoV regurg. TRH called to admit for continued management of unstable angina/ACS  Assessment & Plan:   Principal Problem:   Unstable angina (HCC) Active Problems:   Coronary artery disease involving native coronary artery of native heart with unstable angina pectoris (HCC)   Hypertension associated with diabetes (HCC)   Hyperlipidemia associated with type 2 diabetes mellitus (HCC)   Type 2 diabetes mellitus with hyperglycemia (HCC)   Gastroesophageal reflux disease without esophagitis   Generalized anxiety disorder  Unstable angina in a patient with history of coronary artery disease involving native coronary artery of native heart:  Patient with h/o obstructive CAD s/p Stenting x 4, last Jan 2024. She reports that her symptoms are just like previous episodes of chest pressure which was due to recurrent LAD stenosis or instent stenosis.  Slightly worsened chest pain today but hemodynamically stable.  Remains on heparin drip, pending cardiology evaluation.   Hypertension: BP mildly elevated at time of admission, most likely situational.  Home medications including atenolol, Lasix, Imdur and lisinopril were resumed, she  has not received them yet and blood pressure is improving.   Generalized anxiety disorder Patient appears anxious.  Continue trazadon   Gastroesophageal reflux disease without esophagitis Continue PPI.   Type 2 diabetes mellitus with hyperglycemia (HCC) Patient on Novolog at meals, [place] daily - she adjusts her dose and Ozempic weekly. Last POC A1C 02/04/23 7.7%.  Currently on SSI.  Significantly hyperglycemic.  It appears that she takes 60 units of Tresiba at home.  Will start on 30 units now.   Hyperlipidemia associated with type 2 diabetes mellitus (HCC) LDL at goal but HDL low.  Continue Crestor.  DVT prophylaxis: Heparin drip   Code Status: Full Code  Family Communication:  None present at bedside.  Plan of care discussed with patient in length and he/she verbalized understanding and agreed with it.  Status is: Inpatient Remains inpatient appropriate because: Pending cardiology evaluation, may need cardiac catheterization.   Estimated body mass index is 23.42 kg/m as calculated from the following:   Height as of this encounter: 5\' 6"  (1.676 m).   Weight as of this encounter: 65.8 kg.    Nutritional Assessment: Body mass index is 23.42 kg/m.Marland Kitchen Seen by dietician.  I agree with the assessment and plan as outlined below: Nutrition Status:        . Skin Assessment: I have examined the patient's skin and I agree with the wound assessment as performed by the wound care RN as outlined below:    Consultants:  Cardiology  Procedures:  As above  Antimicrobials:  Anti-infectives (From admission, onward)    None  Subjective: Patient seen and examined, she says that she has slightly worsened chest pressure which is radiating to the back.  Also has some nausea but no shortness of breath or diaphoresis.  Repeat EKG did not show any acute ST-T wave changes this morning.  Objective: Vitals:   02/12/23 2300 02/13/23 0207 02/13/23 0711 02/13/23 0806  BP: (!)  157/80 (!) 169/73 (!) 149/77   Pulse: 89 92 90   Resp: 13 15 15  (!) 24  Temp:  98.5 F (36.9 C)  98.9 F (37.2 C)  TempSrc:  Oral  Oral  SpO2: 92% 94% 98% 98%  Weight:  65.8 kg    Height:  5\' 6"  (1.676 m)      Intake/Output Summary (Last 24 hours) at 02/13/2023 0906 Last data filed at 02/13/2023 0600 Gross per 24 hour  Intake 675.97 ml  Output --  Net 675.97 ml   Filed Weights   02/12/23 1400 02/13/23 0207  Weight: 65.3 kg 65.8 kg    Examination:  General exam: Appears anxious Respiratory system: Clear to auscultation. Respiratory effort normal. Cardiovascular system: S1 & S2 heard, RRR. No JVD, murmurs, rubs, gallops or clicks. No pedal edema. Gastrointestinal system: Abdomen is nondistended, soft and nontender. No organomegaly or masses felt. Normal bowel sounds heard. Central nervous system: Alert and oriented. No focal neurological deficits. Extremities: Symmetric 5 x 5 power. Skin: No rashes, lesions or ulcers   Data Reviewed: I have personally reviewed following labs and imaging studies  CBC: Recent Labs  Lab 02/12/23 1442 02/13/23 0108  WBC 8.7 10.0  HGB 12.8 12.0  HCT 36.8 34.6*  MCV 85.4 85.6  PLT 184 188   Basic Metabolic Panel: Recent Labs  Lab 02/12/23 1442  NA 134*  K 3.0*  CL 95*  CO2 27  GLUCOSE 232*  BUN 13  CREATININE 1.11*  CALCIUM 9.1   GFR: Estimated Creatinine Clearance: 41.6 mL/min (A) (by C-G formula based on SCr of 1.11 mg/dL (H)). Liver Function Tests: No results for input(s): "AST", "ALT", "ALKPHOS", "BILITOT", "PROT", "ALBUMIN" in the last 168 hours. No results for input(s): "LIPASE", "AMYLASE" in the last 168 hours. No results for input(s): "AMMONIA" in the last 168 hours. Coagulation Profile: No results for input(s): "INR", "PROTIME" in the last 168 hours. Cardiac Enzymes: No results for input(s): "CKTOTAL", "CKMB", "CKMBINDEX", "TROPONINI" in the last 168 hours. BNP (last 3 results) No results for input(s): "PROBNP"  in the last 8760 hours. HbA1C: No results for input(s): "HGBA1C" in the last 72 hours. CBG: No results for input(s): "GLUCAP" in the last 168 hours. Lipid Profile: Recent Labs    02/13/23 0108  CHOL 105  HDL 39*  LDLCALC 50  TRIG 81  CHOLHDL 2.7   Thyroid Function Tests: No results for input(s): "TSH", "T4TOTAL", "FREET4", "T3FREE", "THYROIDAB" in the last 72 hours. Anemia Panel: No results for input(s): "VITAMINB12", "FOLATE", "FERRITIN", "TIBC", "IRON", "RETICCTPCT" in the last 72 hours. Sepsis Labs: No results for input(s): "PROCALCITON", "LATICACIDVEN" in the last 168 hours.  Recent Results (from the past 240 hour(s))  Microscopic Examination     Status: Abnormal   Collection Time: 02/04/23  8:26 AM   Urine  Result Value Ref Range Status   WBC, UA >30 (A) 0 - 5 /hpf Final   RBC, Urine 0-2 0 - 2 /hpf Final   Epithelial Cells (non renal) 0-10 0 - 10 /hpf Final   Renal Epithel, UA None seen None seen /hpf Final   Bacteria, UA Many (A)  None seen/Few Final  Urine Culture     Status: Abnormal   Collection Time: 02/04/23 10:13 AM   Specimen: Urine   UR  Result Value Ref Range Status   Urine Culture, Routine Final report (A)  Final   Organism ID, Bacteria Klebsiella pneumoniae (A)  Final    Comment: Cefazolin <=4 ug/mL Cefazolin with an MIC <=16 predicts susceptibility to the oral agents cefaclor, cefdinir, cefpodoxime, cefprozil, cefuroxime, cephalexin, and loracarbef when used for therapy of uncomplicated urinary tract infections due to E. coli, Klebsiella pneumoniae, and Proteus mirabilis. Greater than 100,000 colony forming units per mL    Antimicrobial Susceptibility Comment  Final    Comment:       ** S = Susceptible; I = Intermediate; R = Resistant **                    P = Positive; N = Negative             MICS are expressed in micrograms per mL    Antibiotic                 RSLT#1    RSLT#2    RSLT#3    RSLT#4 Amoxicillin/Clavulanic Acid    S Ampicillin                      R Cefepime                       S Ceftriaxone                    S Cefuroxime                     S Ciprofloxacin                  S Ertapenem                      S Gentamicin                     S Imipenem                       S Levofloxacin                   S Meropenem                      S Nitrofurantoin                 S Piperacillin/Tazobactam        S Tetracycline                   S Tobramycin                     S Trimethoprim/Sulfa             S      Radiology Studies: DG Chest Port 1 View  Result Date: 02/12/2023 CLINICAL DATA:  Nausea and vomiting associated with chest pressure EXAM: PORTABLE CHEST 1 VIEW COMPARISON:  Chest radiograph dated 10/04/2022 FINDINGS: Normal lung volumes. No focal consolidations. Rounded lucency projecting above the medial left hemidiaphragm. No pleural effusion or pneumothorax. Similar cardiomediastinal silhouette. No acute osseous abnormality. IMPRESSION: 1.  No focal consolidations. 2. Rounded lucency projecting above the medial left hemidiaphragm may  represent a hiatal hernia. Electronically Signed   By: Agustin Cree M.D.   On: 02/12/2023 14:43    Scheduled Meds:  aspirin  81 mg Oral Daily   aspirin EC  81 mg Oral Daily   atenolol  50 mg Oral BID   furosemide  40 mg Oral Daily   insulin aspart  0-15 Units Subcutaneous TID WC   insulin aspart  0-5 Units Subcutaneous QHS   isosorbide mononitrate  120 mg Oral Daily   lisinopril  10 mg Oral Daily   potassium chloride  10 mEq Oral BID   rosuvastatin  20 mg Oral Daily   Continuous Infusions:  sodium chloride 50 mL/hr at 02/12/23 1937   heparin 800 Units/hr (02/13/23 0332)     LOS: 1 day   Hughie Closs, MD [facility]  02/13/2023, 9:06 AM   *Please note that this is a verbal dictation therefore any spelling or grammatical errors are due to the "Dragon [employer] One" system interpretation.  Please page via Amion and do not message via secure chat for urgent  patient care matters. Secure chat can be used for non urgent patient care matters.  How to contact the [facility] Attending or Consulting provider 7A - 7P or covering provider during after hours 7P -7A, for this patient?  Check the care team in [facility] and look for a) attending/consulting [facility] provider listed and b) the [facility] team listed. Page or secure chat 7A-7P. Log into [email or web address] and use [place]'s universal password to access. If you do not have the password, please contact the hospital operator. Locate the [facility] provider you are looking for under [facility] and page to a number that you can be directly reached. If you still have difficulty reaching the provider, please page the [facility] (Director on Call) for the Hospitalists listed on amion for assistance.

## 2023-02-13 NOTE — ED Notes (Signed)
Pt ambulated to the restroom with no difficulty  

## 2023-02-13 NOTE — Progress Notes (Signed)
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,  pt has had prednisone prep with heart caths x 2 without problem  kdean 04/16/07, Onset Date: [id number]    Ticlid [Ticlopidine Hcl] Nausea And Vomiting   Jardiance [Empagliflozin] Other (See Comments)    Recurrent UTIs   Metformin And Related Diarrhea   Codeine Nausea And Vomiting and Palpitations    Patient Measurements: Height: 5\' 6"  (167.6 cm) Weight: 65.3 kg (144 lb) IBW/kg (Calculated) : 59.3 Heparin Dosing Weight: 65kg  Vital Signs: Temp: 98.5 F (36.9 C) (05/22 0207) Temp Source: Oral (05/22 0207) BP: 169/73 (05/22 0207) Pulse Rate: 92 (05/22 0207)  Labs: Recent Labs    02/12/23 1442 02/12/23 1606 02/12/23 1940 02/12/23 2121 02/13/23 0108  HGB 12.8  --   --   --  12.0  HCT 36.8  --   --   --  34.6*  PLT 184  --   --   --  188  HEPARINUNFRC  --   --   --   --  0.85*  CREATININE 1.11*  --   --   --   --   TROPONINIHS 21* 21* 20* 20*  --      Estimated Creatinine Clearance: 41.6 mL/min (A) (by C-G formula based on SCr of 1.11 mg/dL (H)).   Assessment: 73 y.o. female with chest pain for heparin    Goal of Therapy:  Heparin level 0.3-0.7 units/ml Monitor platelets by anticoagulation protocol: Yes   Plan:  Decrease Heparin 800 units/hr Check heparin level in 8 hours.   Geannie Risen, PharmD, BCPS  02/13/2023 2:17 AM

## 2023-02-13 NOTE — ED Notes (Signed)
Courtesy call given to floor

## 2023-02-13 NOTE — Inpatient Diabetes Management (Addendum)
Inpatient Diabetes Program Recommendations  AACE/ADA: New Consensus Statement on Inpatient Glycemic Control (2015)  Target Ranges:  Prepandial:   less than 140 mg/dL      Peak postprandial:   less than 180 mg/dL (1-2 hours)      Critically ill patients:  140 - 180 mg/dL   Lab Results  Component Value Date   GLUCAP 295 (H) 02/13/2023   HGBA1C 7.8 (H) 02/13/2023    Review of Glycemic Control  Latest Reference Range & Units 02/13/23 09:29 02/13/23 11:03  Glucose-Capillary 70 - 99 mg/dL 161 (H) 096 (H)   Diabetes history: DM 2 Outpatient Diabetes medications:  Freestyle Libre 3 Novolog 25-36 units tid with meals Tresiba 60 units daily Ozempic 2 mg weekly Current orders for Inpatient glycemic control:  Novolog 0-15 units tid with meals and HS  Inpatient Diabetes Program Recommendations:    Consider adding Semglee 30 units daily while in the hospital (1/2 of home dose of basal insulin).  Also will likely need meal coverage added as well.  Will follow.   Addendum:  Spoke with patient regarding home glycemic control.  She states that blood sugars are either too high or too low. She states that she usually never takes more than 20 units of Novolog with meals however she often has lows in the evenings and often wakes up >300  mg/dL in the mornings.  I wonder if she is dropping in the middle of the nights and possibly rebounding in the mornings.  Discussed with patient the importance of preventing low blood sugars first!! She wears a sensor but uses a reader that she left at home.  I encouraged her to look at overnight blood sugars and to let MD know if she is going low. Again anticipate that she needs much less insulin based on what she reports.    Thanks,  Beryl Meager, RN, BC-ADM Inpatient Diabetes Coordinator Pager [phone]    Thanks,  Beryl Meager, RN, BC-ADM Inpatient Diabetes Coordinator Pager [phone]  (8a-5p)

## 2023-02-13 NOTE — ED Notes (Signed)
ED TO INPATIENT HANDOFF REPORT  ED Nurse Name and Phone #:  Jess F   S Name/Age/Gender [name] 72 y.o. female Room/Bed: APA03/APA03  Code Status   Code Status: Full Code  Home/SNF/Other Home Patient oriented to: self, place, time, and situation Is this baseline? Yes   Triage Complete: Triage complete  Chief Complaint Unstable angina (HCC) [I20.0]  Triage Note Pt reports onset of vomiting today while she was with her daughter because her grandchild passed away.  Pt reports after vomiting for several hours she began to have chest pressure and she had stents placed in January so she wanted to be checked out.   Allergies Allergies  Allergen Reactions   Iohexol      Desc: pt had syncopal episode with nausea post IV CM late 1990's,  pt has had prednisone prep with heart caths x 2 without problem  kdean 04/16/07, Onset Date: [id number]    Ticlid [Ticlopidine Hcl] Nausea And Vomiting   Jardiance [Empagliflozin] Other (See Comments)    Recurrent UTIs   Metformin And Related Diarrhea   Codeine Nausea And Vomiting and Palpitations    Level of Care/Admitting Diagnosis ED Disposition     ED Disposition  Admit   Condition  --   Comment  Hospital Area: [facility] [[id number]]  Level of Care: Telemetry Cardiac [[id number]]  May admit patient to [facility] or [facility] if equivalent level of care is available:: No  Covid Evaluation: Asymptomatic - no recent exposure (last 10 days) testing not required  Diagnosis: Unstable angina [facility]) [[id number]]  Admitting Physician: Jacques Navy [[id number]]  Attending Physician: Jacques Navy [[id number]]  Certification:: I certify this patient will need inpatient services for at least 2 midnights  Estimated Length of Stay: 4          B Medical/Surgery History Past Medical History:  Diagnosis Date   Anxiety    CAD (coronary artery disease)    DES to circumflex 02/2007, BMS to LAD and PTCA diagonal 03/2007   Carotid artery  plaque    Mild   Cataract    Depression    Diverticulitis, colon    Elevated d-dimer 01/08/2014   Essential hypertension, benign    GERD (gastroesophageal reflux disease)    H/O hiatal hernia    HLD (hyperlipidemia)    IDDM (insulin dependent diabetes mellitus)    Migraine    "used to have them really bad; don't have them anymore" (01/07/2014)   MS (multiple sclerosis) (HCC)    Not confirmed   PAT (paroxysmal atrial tachycardia)    Prolapse of uterus    PVD (peripheral vascular disease) (HCC)    TIA (transient ischemic attack) 1980's   Past Surgical History:  Procedure Laterality Date   ABDOMINAL HYSTERECTOMY  1986   ovaries remain - prolaspe uterus    APPENDECTOMY  ~ 1970   BREAST BIOPSY Right 1980's   BREAST LUMPECTOMY Right 1980's   Dr. Luan Moore    CARDIAC CATHETERIZATION  01/07/2014   CHOLECYSTECTOMY  ?1987   COLONOSCOPY  2002   Dr. Anwar--> Severe diverticular changes in the region of the sigmoid and descending colon with scattered diverticular changes throughout the rest of the colon. No polyps, ulcerations. Despite numerous manipulations, the tip of the scope could not be tipped into the cecal area.   COLONOSCOPY  01/10/2012   Procedure: COLONOSCOPY;  Surgeon: Corbin Ade, MD;  [facility];  Service: Endoscopy;  Laterality: N/A;  1:55  CORONARY ANGIOPLASTY WITH STENT PLACEMENT  ~ 1997 X 2   "2 + 1"   CORONARY BALLOON ANGIOPLASTY N/A 10/05/2022   Procedure: CORONARY BALLOON ANGIOPLASTY;  Surgeon: Tonny Bollman, MD;  Location: [facility] INVASIVE CV LAB;  Service: Cardiovascular;  Laterality: N/A;   CORONARY PRESSURE/FFR STUDY N/A 03/08/2017   Procedure: Intravascular Pressure Wire/FFR Study;  Surgeon: Yvonne Kendall, MD;  Location: [facility] INVASIVE CV LAB;  Service: Cardiovascular;  Laterality: N/A;   CORONARY STENT INTERVENTION N/A 03/26/2022   Procedure: CORONARY STENT INTERVENTION;  Surgeon: Runell Gess, MD;  Location: [facility] INVASIVE CV LAB;  Service:  Cardiovascular;  Laterality: N/A;   EYE SURGERY Bilateral 2014   cataract   LEFT HEART CATH AND CORONARY ANGIOGRAPHY N/A 03/08/2017   Procedure: Left Heart Cath and Coronary Angiography;  Surgeon: Yvonne Kendall, MD;  Location: [facility] INVASIVE CV LAB;  Service: Cardiovascular;  Laterality: N/A;   LEFT HEART CATH AND CORONARY ANGIOGRAPHY N/A 03/26/2022   Procedure: LEFT HEART CATH AND CORONARY ANGIOGRAPHY;  Surgeon: Runell Gess, MD;  Location: [facility] INVASIVE CV LAB;  Service: Cardiovascular;  Laterality: N/A;   LEFT HEART CATH AND CORONARY ANGIOGRAPHY N/A 10/05/2022   Procedure: LEFT HEART CATH AND CORONARY ANGIOGRAPHY;  Surgeon: Tonny Bollman, MD;  Location: [facility] INVASIVE CV LAB;  Service: Cardiovascular;  Laterality: N/A;   LEFT HEART CATHETERIZATION WITH CORONARY ANGIOGRAM N/A 01/07/2014   Procedure: LEFT HEART CATHETERIZATION WITH CORONARY ANGIOGRAM;  Surgeon: Laurey Morale, MD;  Location: [facility] CATH LAB;  Service: Cardiovascular;  Laterality: N/A;     A IV Location/Drains/Wounds Patient Lines/Drains/Airways Status     Active Line/Drains/Airways     Name Placement date Placement time Site Days   Peripheral IV 02/12/23 20 G Right Antecubital 02/12/23  1443  Antecubital  1            Intake/Output Last 24 hours No intake or output data in the 24 hours ending 02/13/23 0008  Labs/Imaging Results for orders placed or performed during the hospital encounter of 02/12/23 (from the past 48 hour(s))  Basic metabolic panel     Status: Abnormal   Collection Time: 02/12/23  2:42 PM  Result Value Ref Range   Sodium 134 (L) 135 - 145 mmol/L   Potassium 3.0 (L) 3.5 - 5.1 mmol/L   Chloride 95 (L) 98 - 111 mmol/L   CO2 27 22 - 32 mmol/L   Glucose, Bld 232 (H) 70 - 99 mg/dL    Comment: Glucose reference range applies only to samples taken after fasting for at least 8 hours.   BUN 13 8 - 23 mg/dL   Creatinine, Ser 4.09 (H) 0.44 - 1.00 mg/dL   Calcium 9.1 8.9 - 81.1 mg/dL   GFR, Estimated 52  (L) >60 mL/min    Comment: (NOTE) Calculated using the CKD-EPI Creatinine Equation (2021)    Anion gap 12 5 - 15    Comment: Performed at [facility], [address] [place], [place] [address]  CBC     Status: None   Collection Time: 02/12/23  2:42 PM  Result Value Ref Range   WBC 8.7 4.0 - 10.5 K/uL   RBC 4.31 3.87 - 5.11 MIL/uL   Hemoglobin 12.8 12.0 - 15.0 g/dL   HCT 29.5 62.1 - 30.8 %   MCV 85.4 80.0 - 100.0 fL   MCH 29.7 26.0 - 34.0 pg   MCHC 34.8 30.0 - 36.0 g/dL   RDW 65.7 84.6 - 96.2 %   Platelets 184 150 -  400 K/uL   nRBC 0.0 0.0 - 0.2 %    Comment: Performed at [facility], [address], [place], [place] [address]  Troponin I (High Sensitivity)     Status: Abnormal   Collection Time: 02/12/23  2:42 PM  Result Value Ref Range   Troponin I (High Sensitivity) 21 (H) <18 ng/L    Comment: (NOTE) Elevated high sensitivity troponin I (hsTnI) values and significant  changes across serial measurements may suggest ACS but many other  chronic and acute conditions are known to elevate hsTnI results.  Refer to the "Links" section for chest pain algorithms and additional  guidance. Performed at [facility], [address] [place], [place] [address]   Troponin I (High Sensitivity)     Status: Abnormal   Collection Time: 02/12/23  4:06 PM  Result Value Ref Range   Troponin I (High Sensitivity) 21 (H) <18 ng/L    Comment: (NOTE) Elevated high sensitivity troponin I (hsTnI) values and significant  changes across serial measurements may suggest ACS but many other  chronic and acute conditions are known to elevate hsTnI results.  Refer to the "Links" section for chest pain algorithms and additional  guidance. Performed at [facility], [address], [place], [place] [address]   Troponin I (High Sensitivity)     Status: Abnormal   Collection Time: 02/12/23  7:40 PM  Result Value Ref Range   Troponin I (High Sensitivity) 20 (H) <18 ng/L    Comment: (NOTE) Elevated high  sensitivity troponin I (hsTnI) values and significant  changes across serial measurements may suggest ACS but many other  chronic and acute conditions are known to elevate hsTnI results.  Refer to the "Links" section for chest pain algorithms and additional  guidance. Performed at [facility], [address], [place], [place] [address]   Troponin I (High Sensitivity)     Status: Abnormal   Collection Time: 02/12/23  9:21 PM  Result Value Ref Range   Troponin I (High Sensitivity) 20 (H) <18 ng/L    Comment: (NOTE) Elevated high sensitivity troponin I (hsTnI) values and significant  changes across serial measurements may suggest ACS but many other  chronic and acute conditions are known to elevate hsTnI results.  Refer to the "Links" section for chest pain algorithms and additional  guidance. Performed at [facility], [address], [place], [place] [address]    DG Chest Port 1 View  Result Date: 02/12/2023 CLINICAL DATA:  Nausea and vomiting associated with chest pressure EXAM: PORTABLE CHEST 1 VIEW COMPARISON:  Chest radiograph dated 10/04/2022 FINDINGS: Normal lung volumes. No focal consolidations. Rounded lucency projecting above the medial left hemidiaphragm. No pleural effusion or pneumothorax. Similar cardiomediastinal silhouette. No acute osseous abnormality. IMPRESSION: 1.  No focal consolidations. 2. Rounded lucency projecting above the medial left hemidiaphragm may represent a hiatal hernia. Electronically Signed   By: Agustin Cree M.D.   On: 02/12/2023 14:43    Pending Labs Unresulted Labs (From admission, onward)     Start     Ordered   02/13/23 0500  CBC  Daily,   R      02/12/23 1608   02/13/23 0500  Lipid panel  Tomorrow morning,   R        02/12/23 1850   02/13/23 0100  Heparin level (unfractionated)  Once-Timed,   URGENT        02/12/23 1608            Vitals/Pain Today's Vitals  02/12/23 2130 02/12/23 2200 02/12/23 2230 02/12/23 2300  BP: (!) 152/88 (!)  146/89 (!) 153/82 (!) 157/80  Pulse: 66 94 92 89  Resp: 14 14 13 13   Temp:      TempSrc:      SpO2: 97% 94% 95% 92%  Weight:      Height:      PainSc:        Isolation Precautions No active isolations  Medications Medications  nitroGLYCERIN (NITROSTAT) SL tablet 0.4 mg (0.4 mg Sublingual Given 02/12/23 1546)  heparin ADULT infusion 100 units/mL (25000 units/234mL) (900 Units/hr Intravenous New Bag/Given 02/12/23 1631)  aspirin chewable tablet 81 mg (has no administration in time range)  atenolol (TENORMIN) tablet 50 mg (50 mg Oral Given 02/12/23 2110)  furosemide (LASIX) tablet 40 mg (has no administration in time range)  isosorbide mononitrate (IMDUR) 24 hr tablet 120 mg (120 mg Oral Given 02/12/23 1937)  lisinopril (ZESTRIL) tablet 10 mg (has no administration in time range)  rosuvastatin (CRESTOR) tablet 20 mg (has no administration in time range)  traZODone (DESYREL) tablet 100 mg (has no administration in time range)  rOPINIRole (REQUIP) tablet 1 mg (has no administration in time range)  potassium chloride SA (KLOR-CON M) CR tablet 10 mEq (10 mEq Oral Not Given 02/12/23 1812)  aspirin EC tablet 81 mg (has no administration in time range)  nitroGLYCERIN (NITROSTAT) SL tablet 0.4 mg (has no administration in time range)  acetaminophen (TYLENOL) tablet 650 mg (has no administration in time range)  ondansetron (ZOFRAN) injection 4 mg (4 mg Intravenous Given 02/12/23 1934)  0.45 % sodium chloride infusion ( Intravenous New Bag/Given 02/12/23 1937)  metoCLOPramide (REGLAN) injection 10 mg (10 mg Intravenous Given 02/12/23 1443)  morphine (PF) 4 MG/ML injection 4 mg (4 mg Intravenous Given 02/12/23 1621)  heparin bolus via infusion 4,000 Units (4,000 Units Intravenous Bolus from Bag 02/12/23 1632)  potassium chloride SA (KLOR-CON M) CR tablet 40 mEq (40 mEq Oral Given 02/12/23 1619)    Mobility walks     Focused Assessments Cardiac Assessment Handoff:  Cardiac Rhythm: Atrial  fibrillation Lab Results  Component Value Date   CKTOTAL 92 12/24/2021   CKMB 2.3 08/24/2011   CKMBINDEX <1.0 12/27/2021   TROPONINI <0.03 09/29/2016   Lab Results  Component Value Date   DDIMER 0.71 (H) 01/07/2014   Does the Patient currently have chest pain? No    R Recommendations: See Admitting Provider Note  Report given to:   Additional Notes: afib

## 2023-02-13 NOTE — ED Notes (Signed)
Report given to [facility] 

## 2023-02-13 NOTE — H&P (View-Only) (Signed)
Cardiology Consultation   Patient ID: RONAN BATAILLE MRN: [id number]; DOB: 1950-10-05  Admit date: 02/12/2023 Date of Consult: 02/13/2023  PCP:  Gabriel Earing, FNP   [facility] Providers Cardiologist:  Rollene Rotunda, MD   Patient Profile:   [name] is a 72 y.o. female with a hx of CAD with prior PCI and ISR on DAPT, HTN, IDDM, HLD, and GERD who is being seen 02/13/2023 for the evaluation of chest pain at the request of Dr. Jacqulyn Bath.  History of Present Illness:   Ms. Noss has a history of CAD with prior PCI.  DES-LCX 02/2007, BMS-LAD and diagonal 03/2007. LHC 2015 with no obstructive disease and patent stents. Negative workup in 2016 for slurred speech. POET and nuclear stress tests with possible ischemia. Repeat heart cath 2018 with patent stents. Echo in Dec 2018 with LVEF 60-65%, mild LVH, grade 1 DD, and mild MS.  Heart catheterization 03/2022 for unstable angina with 90% mid LCX and 95% proximal to mid LAD treated with DES-LCX 2.25 x 12 mm distal to the previously placed DES, DES-pLAD 2.25 x 12 mm just proximal to the previously placed stent in mid LAD that was patent.   She was again admitted Jan 2024 for unstable angina with repeat heart catheterization showing severe in-stent restenosis in the LAD stent treated with noncompliant balloon angioplasty. She was continued on ASA and plavix, with the suggestion to consider long term therapy given multiple interventions and ISR.   Of note, troponin remained negative during that hospitalization.  She has been maintained on ASA, plavix, atenolol 50 mg BID, imdur 120 mg, 10 mg lisinopril, and 40 mg lasix daily.  EMS was dispatched 02/12/23 for persistent headache, nausea, vomiting with hypertensive emergency BP 190/70. She reports onset of vomiting because her grandchild passed away. Pt declined ER transport, no EKG given no reports of chest pain. Pt called PCP who advised ER evaluation. She was seen at [facility] ER and  found to have a mild and flat troponin elevation. She was transferred to [facility] for further management.   On arrival, she reported onset of chest pressure while visiting her daughter, both grieving death of a grandchild 2 days prior. She had nausea then emesis  followed by onset of sudden substernal chest pressure. She received ASA and SL nitro. Heparin gtt started.  HS troponin 21 --> 21 --> 20 --> 20 EKG with new anterior Q waves K 3.0 BG 318  She continues to have nearly constant chest pressure rated as a 7/10 currently. BP 157/91 in the room. She appears in no acute distress. She vomited several times and is worried about her BG as this is labile - 55 --> >300 this morning. Last A1c 7.7%. she reports her CP is similar to her prior [place].      Past Medical History:  Diagnosis Date   Anxiety    CAD (coronary artery disease)    DES to circumflex 02/2007, BMS to LAD and PTCA diagonal 03/2007   Carotid artery plaque    Mild   Cataract    Depression    Diverticulitis, colon    Elevated d-dimer 01/08/2014   Essential hypertension, benign    GERD (gastroesophageal reflux disease)    H/O hiatal hernia    HLD (hyperlipidemia)    IDDM (insulin dependent diabetes mellitus)    Migraine    "used to have them really bad; don't have them anymore" (01/07/2014)   MS (multiple sclerosis) (HCC)  Not confirmed   PAT (paroxysmal atrial tachycardia)    Prolapse of uterus    PVD (peripheral vascular disease) (HCC)    TIA (transient ischemic attack) 1980's    Past Surgical History:  Procedure Laterality Date   ABDOMINAL HYSTERECTOMY  1986   ovaries remain - prolaspe uterus    APPENDECTOMY  ~ 1970   BREAST BIOPSY Right 1980's   BREAST LUMPECTOMY Right 1980's   Dr. Luan Moore    CARDIAC CATHETERIZATION  01/07/2014   CHOLECYSTECTOMY  ?1987   COLONOSCOPY  2002   Dr. Anwar--> Severe diverticular changes in the region of the sigmoid and descending colon with scattered diverticular changes throughout the  rest of the colon. No polyps, ulcerations. Despite numerous manipulations, the tip of the scope could not be tipped into the cecal area.   COLONOSCOPY  01/10/2012   Procedure: COLONOSCOPY;  Surgeon: Corbin Ade, MD;  Location: [facility];  Service: Endoscopy;  Laterality: N/A;  1:55   CORONARY ANGIOPLASTY WITH STENT PLACEMENT  ~ 1997 X 2   "2 + 1"   CORONARY BALLOON ANGIOPLASTY N/A 10/05/2022   Procedure: CORONARY BALLOON ANGIOPLASTY;  Surgeon: Tonny Bollman, MD;  Location: [facility] INVASIVE CV LAB;  Service: Cardiovascular;  Laterality: N/A;   CORONARY PRESSURE/FFR STUDY N/A 03/08/2017   Procedure: Intravascular Pressure Wire/FFR Study;  Surgeon: Yvonne Kendall, MD;  Location: [facility] INVASIVE CV LAB;  Service: Cardiovascular;  Laterality: N/A;   CORONARY STENT INTERVENTION N/A 03/26/2022   Procedure: CORONARY STENT INTERVENTION;  Surgeon: Runell Gess, MD;  Location: [facility] INVASIVE CV LAB;  Service: Cardiovascular;  Laterality: N/A;   EYE SURGERY Bilateral 2014   cataract   LEFT HEART CATH AND CORONARY ANGIOGRAPHY N/A 03/08/2017   Procedure: Left Heart Cath and Coronary Angiography;  Surgeon: Yvonne Kendall, MD;  Location: [facility] INVASIVE CV LAB;  Service: Cardiovascular;  Laterality: N/A;   LEFT HEART CATH AND CORONARY ANGIOGRAPHY N/A 03/26/2022   Procedure: LEFT HEART CATH AND CORONARY ANGIOGRAPHY;  Surgeon: Runell Gess, MD;  Location: [facility] INVASIVE CV LAB;  Service: Cardiovascular;  Laterality: N/A;   LEFT HEART CATH AND CORONARY ANGIOGRAPHY N/A 10/05/2022   Procedure: LEFT HEART CATH AND CORONARY ANGIOGRAPHY;  Surgeon: Tonny Bollman, MD;  Location: [facility] INVASIVE CV LAB;  Service: Cardiovascular;  Laterality: N/A;   LEFT HEART CATHETERIZATION WITH CORONARY ANGIOGRAM N/A 01/07/2014   Procedure: LEFT HEART CATHETERIZATION WITH CORONARY ANGIOGRAM;  Surgeon: Laurey Morale, MD;  Location: [facility] CATH LAB;  Service: Cardiovascular;  Laterality: N/A;     Home Medications:  Prior to Admission medications    Medication Sig Start Date End Date Taking? Authorizing Provider  alendronate (FOSAMAX) 70 MG tablet Take 1 tablet (70 mg total) by mouth every 7 (seven) days. Take with a full glass of water on an empty stomach. 02/06/23  Yes Gabriel Earing, FNP  aspirin 81 MG chewable tablet Chew 1 tablet (81 mg total) by mouth daily. 03/28/22  Yes Rai, Ripudeep K, MD  atenolol (TENORMIN) 50 MG tablet Take 1 tablet (50 mg total) by mouth 2 (two) times daily. 05/25/22  Yes Gabriel Earing, FNP  clopidogrel (PLAVIX) 75 MG tablet Take 1 tablet (75 mg total) by mouth daily with breakfast. 03/28/22  Yes Rai, Ripudeep K, MD  diphenhydrAMINE (BENADRYL) 25 mg capsule Take 25 mg by mouth every 6 (six) hours as needed for allergies.   Yes [provider]  furosemide (LASIX) 40 MG tablet Take 1 tablet (40 mg total) by mouth  daily. 04/12/22  Yes Gabriel Earing, FNP  insulin aspart (NOVOLOG FLEXPEN) 100 UNIT/ML FlexPen Inject 36 Units into the skin 3 (three) times daily with meals. Patient taking differently: Inject 25-36 Units into the skin 3 (three) times daily with meals. 10/06/22  Yes Vann, Jessica U, DO  insulin degludec (TRESIBA FLEXTOUCH) 100 UNIT/ML FlexTouch Pen Inject 60 Units into the skin daily.   Yes [provider]  isosorbide mononitrate (IMDUR) 120 MG 24 hr tablet Take 1 tablet (120 mg total) by mouth daily. 10/17/22  Yes Rollene Rotunda, MD  lisinopril (ZESTRIL) 10 MG tablet Take 1 tablet (10 mg total) by mouth daily. 11/21/22  Yes Hochrein, Fayrene Fearing, MD  nitroGLYCERIN (NITROSTAT) 0.4 MG SL tablet DISSOLVE 1 TABLET UNDER TONGUE FOR CHESTPAIN.MAY REPEAT EVERY 5 MINUTES FOR 3 DOSES.IF NO RELIEF CALL 911 OR GO TO ER 03/27/22  Yes Rai, Ripudeep K, MD  rOPINIRole (REQUIP) 0.5 MG tablet Take 2 tablets (1 mg total) by mouth daily as needed (restless leg). 10/06/22  Yes Vann, Jessica U, DO  rosuvastatin (CRESTOR) 20 MG tablet TAKE ONE (1) TABLET BY MOUTH EVERY DAY 10/29/22  Yes Gabriel Earing, FNP   Semaglutide, 1 MG/DOSE, (OZEMPIC, 1 MG/DOSE,) 2 MG/1.5ML SOPN Inject 2 mg into the skin once a week.   Yes [provider]  traZODone (DESYREL) 100 MG tablet Take 1 tablet (100 mg total) by mouth at bedtime as needed for sleep. 01/15/22  Yes Gabriel Earing, FNP  cephALEXin (KEFLEX) 500 MG capsule Take 1 capsule (500 mg total) by mouth 2 (two) times daily. Patient not taking: Reported on 02/12/2023 02/04/23   Gabriel Earing, FNP  Continuous Blood Gluc Sensor (FREESTYLE LIBRE 3 SENSOR) MISC Place 1 sensor on the skin every 14 days. Use to check glucose continuously. DX:e11.65 11/30/22   Jannifer Rodney A, FNP  potassium chloride (KLOR-CON M) 10 MEQ tablet Take 1 tablet (10 mEq total) by mouth 2 (two) times daily. Patient not taking: Reported on 02/12/2023 06/21/22   Gabriel Earing, FNP    Inpatient Medications: Scheduled Meds:  aspirin  81 mg Oral Daily   aspirin EC  81 mg Oral Daily   atenolol  50 mg Oral BID   furosemide  40 mg Oral Daily   insulin aspart  0-15 Units Subcutaneous TID WC   insulin aspart  0-5 Units Subcutaneous QHS   isosorbide mononitrate  120 mg Oral Daily   lisinopril  10 mg Oral Daily   potassium chloride  10 mEq Oral BID   rosuvastatin  20 mg Oral Daily   Continuous Infusions:  sodium chloride 50 mL/hr at 02/12/23 1937   heparin 800 Units/hr (02/13/23 0332)   PRN Meds: acetaminophen, nitroGLYCERIN, nitroGLYCERIN, ondansetron (ZOFRAN) IV, rOPINIRole, traZODone  Allergies:    Allergies  Allergen Reactions   Iohexol      Desc: pt had syncopal episode with nausea post IV CM late 1990's,  pt has had prednisone prep with heart caths x 2 without problem  kdean 04/16/07, Onset Date: [id number]    Ticlid [Ticlopidine Hcl] Nausea And Vomiting   Jardiance [Empagliflozin] Other (See Comments)    Recurrent UTIs   Metformin And Related Diarrhea   Codeine Nausea And Vomiting and Palpitations    Social History:   Social History   Socioeconomic History    Marital status: Widowed    Spouse name: Not on file   Number of children: 4   Years of education: 11   Highest education level: 11th  grade  Occupational History   Occupation: Disability    Employer: DISABLED  Tobacco Use   Smoking status: Never   Smokeless tobacco: Never   Tobacco comments:    spouse, 41 years - husband has quit 01/2011  Vaping Use   Vaping Use: Never used  Substance and Sexual Activity   Alcohol use: No   Drug use: No   Sexual activity: Not Currently  Other Topics Concern   Not on file  Social History Narrative   Lives alone, one level, handicap accessible bathroom, her children all live nearby   Social Determinants of Health   Financial Resource Strain: Low Risk  (01/24/2023)   Overall Financial Resource Strain (CARDIA)    Difficulty of Paying Living Expenses: Not hard at all  Food Insecurity: No Food Insecurity (01/24/2023)   Hunger Vital Sign    Worried About Running Out of Food in the Last Year: Never true    Ran Out of Food in the Last Year: Never true  Transportation Needs: No Transportation Needs (01/24/2023)   PRAPARE - Administrator, Civil Service (Medical): No    Lack of Transportation (Non-Medical): No  Physical Activity: Insufficiently Active (01/24/2023)   Exercise Vital Sign    Days of Exercise per Week: 3 days    Minutes of Exercise per Session: 30 min  Stress: No Stress Concern Present (01/24/2023)   [employer] of Occupational Health - Occupational Stress Questionnaire    Feeling of Stress : Not at all  Social Connections: Moderately Integrated (01/24/2023)   Social Connection and Isolation Panel [NHANES]    Frequency of Communication with Friends and Family: More than three times a week    Frequency of Social Gatherings with Friends and Family: More than three times a week    Attends Religious Services: More than 4 times per year    Active Member of [employer] or Organizations: No    Attends Banker Meetings: Never     Marital Status: Married  Catering manager Violence: Not At Risk (01/24/2023)   Humiliation, Afraid, Rape, and Kick questionnaire    Fear of Current or Ex-Partner: No    Emotionally Abused: No    Physically Abused: No    Sexually Abused: No    Family History:    Family History  Problem Relation Age of Onset   Heart attack Mother 49   Diabetes Mother    Hypertension Mother    Heart attack Father 82   Heart attack Brother 32       x 6   Heart disease Brother    Diabetes Brother    Colon cancer Paternal Aunt        62s, died with brain anuerysm   Crohn's disease Cousin        paternal   Diabetes Sister    GER disease Daughter    Cervical cancer Daughter    Diabetes Daughter      ROS:  Please see the history of present illness.   All other ROS reviewed and negative.     Physical Exam/Data:   Vitals:   02/13/23 0207 02/13/23 0711 02/13/23 0806 02/13/23 0931  BP: (!) 169/73 (!) 149/77  (!) 157/76  Pulse: 92 90    Resp: 15 15 (!) 24   Temp: 98.5 F (36.9 C)  98.9 F (37.2 C)   TempSrc: Oral  Oral   SpO2: 94% 98% 98%   Weight: 65.8 kg     Height: 5\' 6"  (  1.676 m)       Intake/Output Summary (Last 24 hours) at 02/13/2023 1018 Last data filed at 02/13/2023 0600 Gross per 24 hour  Intake 675.97 ml  Output --  Net 675.97 ml      02/13/2023    2:07 AM 02/12/2023    2:00 PM 02/04/2023    8:14 AM  Last 3 Weights  Weight (lbs) 145 lb 1.6 oz 144 lb 152 lb  Weight (kg) 65.817 kg 65.318 kg 68.947 kg     Body mass index is 23.42 kg/m.  General:  Well nourished, well developed, in no acute distress HEENT: normal Neck: no JVD Vascular: No carotid bruits; Distal pulses 2+ bilaterally Cardiac:  normal S1, S2; RRR; no murmur  Lungs:  clear to auscultation bilaterally, no wheezing, rhonchi or rales  Abd: soft, nontender, no hepatomegaly  Ext: no edema Musculoskeletal:  No deformities, BUE and BLE strength normal and equal Skin: warm and dry  Neuro:  CNs 2-12 intact, no  focal abnormalities noted Psych:  Normal affect   EKG:  The EKG was personally reviewed and demonstrates:  sinus rhythm HR 94, anterior Q waves Telemetry:  Telemetry was personally reviewed and demonstrates:  sinus rhythm in the 80s with PVCs  Relevant CV Studies:  LHC 09/2022: 1.  Severe in-stent restenosis within the previously implanted LAD stent, treated successfully with high-pressure noncompliant balloon angioplasty reducing 95% stenosis to less than 10%, TIMI-3 flow pre and post 2.  Continued patency of the circumflex stented segment with mild in-stent restenosis 3.  Moderate downstream mid LAD in-stent restenosis 4.  Patent RCA with mild diffuse nonobstructive plaquing 5.  Normal LVEDP   Recommendations: Continue DAPT with aspirin and clopidogrel without interruption.  Favor long-term therapy with in this patient with multiple PCI procedures in the past, multiple stents, and in-stent restenotic episodes.   Echo 09/2022:  1. Left ventricular ejection fraction, by estimation, is 60 to 65%. The  left ventricle has normal function. The left ventricle has no regional  wall motion abnormalities. Left ventricular diastolic function could not  be evaluated.   2. Right ventricular systolic function is normal. The right ventricular  size is normal. There is normal pulmonary artery systolic pressure. The  estimated right ventricular systolic pressure is 16.7 mmHg.   3. The mitral valve is degenerative. Mild mitral valve regurgitation. No  evidence of mitral stenosis. Moderate to severe mitral annular  calcification.   4. The aortic valve is tricuspid. Aortic valve regurgitation is not  visualized. No aortic stenosis is present.   5. The inferior vena cava is normal in size with greater than 50%  respiratory variability, suggesting right atrial pressure of 3 mmHg.   Laboratory Data:  High Sensitivity Troponin:   Recent Labs  Lab 02/12/23 1442 02/12/23 1606 02/12/23 1940  02/12/23 2121  TROPONINIHS 21* 21* 20* 20*     Chemistry Recent Labs  Lab 02/12/23 1442  NA 134*  K 3.0*  CL 95*  CO2 27  GLUCOSE 232*  BUN 13  CREATININE 1.11*  CALCIUM 9.1  GFRNONAA 52*  ANIONGAP 12    No results for input(s): "PROT", "ALBUMIN", "AST", "ALT", "ALKPHOS", "BILITOT" in the last 168 hours. Lipids  Recent Labs  Lab 02/13/23 0108  CHOL 105  TRIG 81  HDL 39*  LDLCALC 50  CHOLHDL 2.7    Hematology Recent Labs  Lab 02/12/23 1442 02/13/23 0108  WBC 8.7 10.0  RBC 4.31 4.04  HGB 12.8 12.0  HCT 36.8 34.6*  MCV 85.4 85.6  MCH 29.7 29.7  MCHC 34.8 34.7  RDW 13.6 13.5  PLT 184 188   Thyroid No results for input(s): "TSH", "FREET4" in the last 168 hours.  BNPNo results for input(s): "BNP", "PROBNP" in the last 168 hours.  DDimer No results for input(s): "DDIMER" in the last 168 hours.   Radiology/Studies:  DG Chest Port 1 View  Result Date: 02/12/2023 CLINICAL DATA:  Nausea and vomiting associated with chest pressure EXAM: PORTABLE CHEST 1 VIEW COMPARISON:  Chest radiograph dated 10/04/2022 FINDINGS: Normal lung volumes. No focal consolidations. Rounded lucency projecting above the medial left hemidiaphragm. No pleural effusion or pneumothorax. Similar cardiomediastinal silhouette. No acute osseous abnormality. IMPRESSION: 1.  No focal consolidations. 2. Rounded lucency projecting above the medial left hemidiaphragm may represent a hiatal hernia. Electronically Signed   By: Agustin Cree M.D.   On: 02/12/2023 14:43     Assessment and Plan:   Unstable angina HS troponin 21 --> 21 --> 20 --> 20 EKG with anterior infarct - in the setting of a stressful event - will obtain echo - given CP, will likely need repeat LHC but will await echo first given CP during stressful event - start nitro gtt given ongoing chest pain - plan to start 5 mg amlodipine for anti-anginal effects   CAD with DES-LCX, DES-LAD, ISR 09/2022 - ASA and plavix - continue atenolol - has  not tried coreg, no tremor   Hypertensive urgency Hypertension BP 140-160s PTA: atenolol, 120 mg imdur, 10 mg lisinopril sCr 1.11, K 3.0 - has received 50 mEq K - need repeat BMP - hold lisinopril tomorrow for contrast load - will add on amlodipine for better pressure control   Hyperlipidemia with LDL goal < 55 02/13/2023: Cholesterol 105; HDL 39; LDL Cholesterol 50; Triglycerides 81; VLDL 16 Well-controlled on 20 mg crestot   IDDM Hyperglycemia Per primary   Nausea/vomiting Will keep glucose controlled Could be anginal equivalent No further vomiting since last night   Plan for heart cath this afternoon after echo.  The patient understands that risks included but are not limited to stroke (1 in 1000), death (1 in 1000), kidney failure [usually temporary] (1 in 500), bleeding (1 in 200), allergic reaction [possibly serious] (1 in 200).      Risk Assessment/Risk Scores:     TIMI Risk Score for Unstable Angina or Non-ST Elevation MI:   The patient's TIMI risk score is 6, which indicates a 41% risk of all cause mortality, new or recurrent myocardial infarction or need for urgent revascularization in the next 14 days.       For questions or updates, please contact [facility] Please consult [email or web address] for contact info under    Signed, Marcelino Duster, PA  02/13/2023 10:18 AM   Personally seen and examined. Agree with APP above with the following comments:  Briefly 22 F with CAD s/p PCI and ISR on DAPT, IDDM, and HLD Who presentes with chest pain after her life stressor events. Patient notes that she still have chest pain and that is never really goes away.  She has had POBA, and multiple recent interventions.  She notes that on high dose IMDUR her pain is much more bearable.  Exam notable for no murmur.  R radial +2 no hematoma. Rest as above  Labs notable for no significant elevation in tropinin EKG SR possible V2 TWI new from 2023, no significant  change Tele: SR with no ectopy Personally reviewed relevant tests;  -  Potential under-expansion of prior PCI may be the mechanism of ISR.  Would recommend  - will plan for [facility] tomorrow with Dr. [place] with IVUS or other intra-coronary imaging.  She would not be a great candidate for the drug coated balloon trial, as she has less than ideal substrate if she were to be in the DES arm.  Riley Lam, MD FASE [facility] Cardiologist [facility]  [address] [place], [address] [place], [place] [address] [phone]  12:40 PM

## 2023-02-13 NOTE — Progress Notes (Signed)
Ok to resume plavix per Saundra Shelling.   Ulyses Southward, PharmD, [place], AAHIVP, CPP Infectious Disease Pharmacist 02/13/2023 12:24 PM

## 2023-02-13 NOTE — Consult Note (Addendum)
 Cardiology Consultation   Patient ID: Taneia D Flannagan MRN: [id number]; DOB: 09/02/1951  Admit date: 02/12/2023 Date of Consult: 02/13/2023  PCP:  Morgan, Tiffany M, FNP   [facility] Providers Cardiologist:  James Hochrein, MD   Patient Profile:   Latori D Thornley is a 72 y.o. female with a hx of CAD with prior PCI and ISR on DAPT, HTN, IDDM, HLD, and GERD who is being seen 02/13/2023 for the evaluation of chest pain at the request of Dr. Pahwani.  History of Present Illness:   Ms. Husted has a history of CAD with prior PCI.  DES-LCX 02/2007, BMS-LAD and diagonal 03/2007. LHC 2015 with no obstructive disease and patent stents. Negative workup in 2016 for slurred speech. POET and nuclear stress tests with possible ischemia. Repeat heart cath 2018 with patent stents. Echo in Dec 2018 with LVEF 60-65%, mild LVH, grade 1 DD, and mild MS.  Heart catheterization 03/2022 for unstable angina with 90% mid LCX and 95% proximal to mid LAD treated with DES-LCX 2.25 x 12 mm distal to the previously placed DES, DES-pLAD 2.25 x 12 mm just proximal to the previously placed stent in mid LAD that was patent.   She was again admitted Jan 2024 for unstable angina with repeat heart catheterization showing severe in-stent restenosis in the LAD stent treated with noncompliant balloon angioplasty. She was continued on ASA and plavix, with the suggestion to consider long term therapy given multiple interventions and ISR.   Of note, troponin remained negative during that hospitalization.  She has been maintained on ASA, plavix, atenolol 50 mg BID, imdur 120 mg, 10 mg lisinopril, and 40 mg lasix daily.  EMS was dispatched 02/12/23 for persistent headache, nausea, vomiting with hypertensive emergency BP 190/70. She reports onset of vomiting because her grandchild passed away. Pt declined ER transport, no EKG given no reports of chest pain. Pt called PCP who advised ER evaluation. She was seen at [facility] and  found to have a mild and flat troponin elevation. She was transferred to [facility] for further management.   On arrival, she reported onset of chest pressure while visiting her daughter, both grieving death of a grandchild 2 days prior. She had nausea then emesis  followed by onset of sudden substernal chest pressure. She received ASA and SL nitro. Heparin gtt started.  HS troponin 21 --> 21 --> 20 --> 20 EKG with new anterior Q waves K 3.0 BG 318  She continues to have nearly constant chest pressure rated as a 7/10 currently. BP 157/91 in the room. She appears in no acute distress. She vomited several times and is worried about her BG as this is labile - 55 --> >300 this morning. Last A1c 7.7%. she reports her CP is similar to her prior [place].      Past Medical History:  Diagnosis Date   Anxiety    CAD (coronary artery disease)    DES to circumflex 02/2007, BMS to LAD and PTCA diagonal 03/2007   Carotid artery plaque    Mild   Cataract    Depression    Diverticulitis, colon    Elevated d-dimer 01/08/2014   Essential hypertension, benign    GERD (gastroesophageal reflux disease)    H/O hiatal hernia    HLD (hyperlipidemia)    IDDM (insulin dependent diabetes mellitus)    Migraine    "used to have them really bad; don't have them anymore" (01/07/2014)   MS (multiple sclerosis) (HCC)      Not confirmed   PAT (paroxysmal atrial tachycardia)    Prolapse of uterus    PVD (peripheral vascular disease) (HCC)    TIA (transient ischemic attack) 1980's    Past Surgical History:  Procedure Laterality Date   ABDOMINAL HYSTERECTOMY  1986   ovaries remain - prolaspe uterus    APPENDECTOMY  ~ 1970   BREAST BIOPSY Right 1980's   BREAST LUMPECTOMY Right 1980's   Dr. P. Young    CARDIAC CATHETERIZATION  01/07/2014   CHOLECYSTECTOMY  ?1987   COLONOSCOPY  2002   Dr. Anwar--> Severe diverticular changes in the region of the sigmoid and descending colon with scattered diverticular changes throughout the  rest of the colon. No polyps, ulcerations. Despite numerous manipulations, the tip of the scope could not be tipped into the cecal area.   COLONOSCOPY  01/10/2012   Procedure: COLONOSCOPY;  Surgeon: Robert M Rourk, MD;  Location: [facility];  Service: Endoscopy;  Laterality: N/A;  1:55   CORONARY ANGIOPLASTY WITH STENT PLACEMENT  ~ 1997 X 2   "2 + 1"   CORONARY BALLOON ANGIOPLASTY N/A 10/05/2022   Procedure: CORONARY BALLOON ANGIOPLASTY;  Surgeon: Cooper, Michael, MD;  Location: [facility];  Service: Cardiovascular;  Laterality: N/A;   CORONARY PRESSURE/FFR STUDY N/A 03/08/2017   Procedure: Intravascular Pressure Wire/FFR Study;  Surgeon: End, Christopher, MD;  Location: [facility];  Service: Cardiovascular;  Laterality: N/A;   CORONARY STENT INTERVENTION N/A 03/26/2022   Procedure: CORONARY STENT INTERVENTION;  Surgeon: Berry, Jonathan J, MD;  Location: [facility];  Service: Cardiovascular;  Laterality: N/A;   EYE SURGERY Bilateral 2014   cataract   LEFT HEART CATH AND CORONARY ANGIOGRAPHY N/A 03/08/2017   Procedure: Left Heart Cath and Coronary Angiography;  Surgeon: End, Christopher, MD;  Location: [facility];  Service: Cardiovascular;  Laterality: N/A;   LEFT HEART CATH AND CORONARY ANGIOGRAPHY N/A 03/26/2022   Procedure: LEFT HEART CATH AND CORONARY ANGIOGRAPHY;  Surgeon: Berry, Jonathan J, MD;  [facility];  Service: Cardiovascular;  Laterality: N/A;   LEFT HEART CATH AND CORONARY ANGIOGRAPHY N/A 10/05/2022   Procedure: LEFT HEART CATH AND CORONARY ANGIOGRAPHY;  Surgeon: Cooper, Michael, MD;  [facility];  Service: Cardiovascular;  Laterality: N/A;   LEFT HEART CATHETERIZATION WITH CORONARY ANGIOGRAM N/A 01/07/2014   Procedure: LEFT HEART CATHETERIZATION WITH CORONARY ANGIOGRAM;  Surgeon: Dalton S McLean, MD;  Location: [facility] CATH [facility];  Service: Cardiovascular;  Laterality: N/A;     Home Medications:  Prior to Admission medications    Medication Sig Start Date End Date Taking? Authorizing Provider  alendronate (FOSAMAX) 70 MG tablet Take 1 tablet (70 mg total) by mouth every 7 (seven) days. Take with a full glass of water on an empty stomach. 02/06/23  Yes Morgan, Tiffany M, FNP  aspirin 81 MG chewable tablet Chew 1 tablet (81 mg total) by mouth daily. 03/28/22  Yes Rai, Ripudeep K, MD  atenolol (TENORMIN) 50 MG tablet Take 1 tablet (50 mg total) by mouth 2 (two) times daily. 05/25/22  Yes Morgan, Tiffany M, FNP  clopidogrel (PLAVIX) 75 MG tablet Take 1 tablet (75 mg total) by mouth daily with breakfast. 03/28/22  Yes Rai, Ripudeep K, MD  diphenhydrAMINE (BENADRYL) 25 mg capsule Take 25 mg by mouth every 6 (six) hours as needed for allergies.   Yes [provider]  furosemide (LASIX) 40 MG tablet Take 1 tablet (40 mg total) by mouth   daily. 04/12/22  Yes Morgan, Tiffany M, FNP  insulin aspart (NOVOLOG FLEXPEN) 100 UNIT/ML FlexPen Inject 36 Units into the skin 3 (three) times daily with meals. Patient taking differently: Inject 25-36 Units into the skin 3 (three) times daily with meals. 10/06/22  Yes Vann, Jessica U, DO  insulin degludec (TRESIBA FLEXTOUCH) 100 UNIT/ML FlexTouch Pen Inject 60 Units into the skin daily.   Yes [provider]  isosorbide mononitrate (IMDUR) 120 MG 24 hr tablet Take 1 tablet (120 mg total) by mouth daily. 10/17/22  Yes Hochrein, James, MD  lisinopril (ZESTRIL) 10 MG tablet Take 1 tablet (10 mg total) by mouth daily. 11/21/22  Yes Hochrein, James, MD  nitroGLYCERIN (NITROSTAT) 0.4 MG SL tablet DISSOLVE 1 TABLET UNDER TONGUE FOR CHESTPAIN.MAY REPEAT EVERY 5 MINUTES FOR 3 DOSES.IF NO RELIEF CALL 911 OR GO TO ER 03/27/22  Yes Rai, Ripudeep K, MD  rOPINIRole (REQUIP) 0.5 MG tablet Take 2 tablets (1 mg total) by mouth daily as needed (restless leg). 10/06/22  Yes Vann, Jessica U, DO  rosuvastatin (CRESTOR) 20 MG tablet TAKE ONE (1) TABLET BY MOUTH EVERY DAY 10/29/22  Yes Morgan, Tiffany M, FNP   Semaglutide, 1 MG/DOSE, (OZEMPIC, 1 MG/DOSE,) 2 MG/1.5ML SOPN Inject 2 mg into the skin once a week.   Yes [provider]  traZODone (DESYREL) 100 MG tablet Take 1 tablet (100 mg total) by mouth at bedtime as needed for sleep. 01/15/22  Yes Morgan, Tiffany M, FNP  cephALEXin (KEFLEX) 500 MG capsule Take 1 capsule (500 mg total) by mouth 2 (two) times daily. Patient not taking: Reported on 02/12/2023 02/04/23   Morgan, Tiffany M, FNP  Continuous Blood Gluc Sensor (FREESTYLE LIBRE 3 SENSOR) MISC Place 1 sensor on the skin every 14 days. Use to check glucose continuously. DX:e11.65 11/30/22   Hawks, Christy A, FNP  potassium chloride (KLOR-CON M) 10 MEQ tablet Take 1 tablet (10 mEq total) by mouth 2 (two) times daily. Patient not taking: Reported on 02/12/2023 06/21/22   Morgan, Tiffany M, FNP    Inpatient Medications: Scheduled Meds:  aspirin  81 mg Oral Daily   aspirin EC  81 mg Oral Daily   atenolol  50 mg Oral BID   furosemide  40 mg Oral Daily   insulin aspart  0-15 Units Subcutaneous TID WC   insulin aspart  0-5 Units Subcutaneous QHS   isosorbide mononitrate  120 mg Oral Daily   lisinopril  10 mg Oral Daily   potassium chloride  10 mEq Oral BID   rosuvastatin  20 mg Oral Daily   Continuous Infusions:  sodium chloride 50 mL/hr at 02/12/23 1937   heparin 800 Units/hr (02/13/23 0332)   PRN Meds: acetaminophen, nitroGLYCERIN, nitroGLYCERIN, ondansetron (ZOFRAN) IV, rOPINIRole, traZODone  Allergies:    Allergies  Allergen Reactions   Iohexol      Desc: pt had syncopal episode with nausea post IV CM late 1990's,  pt has had prednisone prep with heart caths x 2 without problem  kdean 04/16/07, Onset Date: [id number]    Ticlid [Ticlopidine Hcl] Nausea And Vomiting   Jardiance [Empagliflozin] Other (See Comments)    Recurrent UTIs   Metformin And Related Diarrhea   Codeine Nausea And Vomiting and Palpitations    Social History:   Social History   Socioeconomic History    Marital status: Widowed    Spouse name: Not on file   Number of children: 4   Years of education: 11   Highest education level: 11th   grade  Occupational History   Occupation: Disability    Employer: DISABLED  Tobacco Use   Smoking status: Never   Smokeless tobacco: Never   Tobacco comments:    spouse, 41 years - husband has quit 01/2011  Vaping Use   Vaping Use: Never used  Substance and Sexual Activity   Alcohol use: No   Drug use: No   Sexual activity: Not Currently  Other Topics Concern   Not on file  Social History Narrative   Lives alone, one level, handicap accessible bathroom, her children all live nearby   Social Determinants of Health   Financial Resource Strain: Low Risk  (01/24/2023)   Overall Financial Resource Strain (CARDIA)    Difficulty of Paying Living Expenses: Not hard at all  Food Insecurity: No Food Insecurity (01/24/2023)   Hunger Vital Sign    Worried About Running Out of Food in the Last Year: Never true    Ran Out of Food in the Last Year: Never true  Transportation Needs: No Transportation Needs (01/24/2023)   PRAPARE - Transportation    Lack of Transportation (Medical): No    Lack of Transportation (Non-Medical): No  Physical Activity: Insufficiently Active (01/24/2023)   Exercise Vital Sign    Days of Exercise per Week: 3 days    Minutes of Exercise per Session: 30 min  Stress: No Stress Concern Present (01/24/2023)   [employer] - Occupational Stress Questionnaire    Feeling of Stress : Not at all  Social Connections: Moderately Integrated (01/24/2023)   Social Connection and Isolation Panel [NHANES]    Frequency of Communication with Friends and Family: More than three times a week    Frequency of Social Gatherings with Friends and Family: More than three times a week    Attends Religious Services: More than 4 times per year    Active Member of Clubs or Organizations: No    Attends Club or Organization Meetings: Never     Marital Status: Married  Intimate Partner Violence: Not At Risk (01/24/2023)   Humiliation, Afraid, Rape, and Kick questionnaire    Fear of Current or Ex-Partner: No    Emotionally Abused: No    Physically Abused: No    Sexually Abused: No    Family History:    Family History  Problem Relation Age of Onset   Heart attack Mother 78   Diabetes Mother    Hypertension Mother    Heart attack Father 99   Heart attack Brother 32       x 6   Heart disease Brother    Diabetes Brother    Colon cancer Paternal Aunt        50s, died with brain anuerysm   Crohn's disease Cousin        paternal   Diabetes Sister    GER disease Daughter    Cervical cancer Daughter    Diabetes Daughter      ROS:  Please see the history of present illness.   All other ROS reviewed and negative.     Physical Exam/Data:   Vitals:   02/13/23 0207 02/13/23 0711 02/13/23 0806 02/13/23 0931  BP: (!) 169/73 (!) 149/77  (!) 157/76  Pulse: 92 90    Resp: 15 15 (!) 24   Temp: 98.5 F (36.9 C)  98.9 F (37.2 C)   TempSrc: Oral  Oral   SpO2: 94% 98% 98%   Weight: 65.8 kg     Height: 5' 6" (  1.676 m)       Intake/Output Summary (Last 24 hours) at 02/13/2023 1018 Last data filed at 02/13/2023 0600 Gross per 24 hour  Intake 675.97 ml  Output --  Net 675.97 ml      02/13/2023    2:07 AM 02/12/2023    2:00 PM 02/04/2023    8:14 AM  Last 3 Weights  Weight (lbs) 145 lb 1.6 oz 144 lb 152 lb  Weight (kg) 65.817 kg 65.318 kg 68.947 kg     Body mass index is 23.42 kg/m.  General:  Well nourished, well developed, in no acute distress HEENT: normal Neck: no JVD Vascular: No carotid bruits; Distal pulses 2+ bilaterally Cardiac:  normal S1, S2; RRR; no murmur  Lungs:  clear to auscultation bilaterally, no wheezing, rhonchi or rales  Abd: soft, nontender, no hepatomegaly  Ext: no edema Musculoskeletal:  No deformities, BUE and BLE strength normal and equal Skin: warm and dry  Neuro:  CNs 2-12 intact, no  focal abnormalities noted Psych:  Normal affect   EKG:  The EKG was personally reviewed and demonstrates:  sinus rhythm HR 94, anterior Q waves Telemetry:  Telemetry was personally reviewed and demonstrates:  sinus rhythm in the 80s with PVCs  Relevant CV Studies:  LHC 09/2022: 1.  Severe in-stent restenosis within the previously implanted LAD stent, treated successfully with high-pressure noncompliant balloon angioplasty reducing 95% stenosis to less than 10%, TIMI-3 flow pre and post 2.  Continued patency of the circumflex stented segment with mild in-stent restenosis 3.  Moderate downstream mid LAD in-stent restenosis 4.  Patent RCA with mild diffuse nonobstructive plaquing 5.  Normal LVEDP   Recommendations: Continue DAPT with aspirin and clopidogrel without interruption.  Favor long-term therapy with in this patient with multiple PCI procedures in the past, multiple stents, and in-stent restenotic episodes.   Echo 09/2022:  1. Left ventricular ejection fraction, by estimation, is 60 to 65%. The  left ventricle has normal function. The left ventricle has no regional  wall motion abnormalities. Left ventricular diastolic function could not  be evaluated.   2. Right ventricular systolic function is normal. The right ventricular  size is normal. There is normal pulmonary artery systolic pressure. The  estimated right ventricular systolic pressure is 16.7 mmHg.   3. The mitral valve is degenerative. Mild mitral valve regurgitation. No  evidence of mitral stenosis. Moderate to severe mitral annular  calcification.   4. The aortic valve is tricuspid. Aortic valve regurgitation is not  visualized. No aortic stenosis is present.   5. The inferior vena cava is normal in size with greater than 50%  respiratory variability, suggesting right atrial pressure of 3 mmHg.   Laboratory Data:  High Sensitivity Troponin:   Recent Labs  Lab 02/12/23 1442 02/12/23 1606 02/12/23 1940  02/12/23 2121  TROPONINIHS 21* 21* 20* 20*     Chemistry Recent Labs  Lab 02/12/23 1442  NA 134*  K 3.0*  CL 95*  CO2 27  GLUCOSE 232*  BUN 13  CREATININE 1.11*  CALCIUM 9.1  GFRNONAA 52*  ANIONGAP 12    No results for input(s): "PROT", "ALBUMIN", "AST", "ALT", "ALKPHOS", "BILITOT" in the last 168 hours. Lipids  Recent Labs  Lab 02/13/23 0108  CHOL 105  TRIG 81  HDL 39*  LDLCALC 50  CHOLHDL 2.7    Hematology Recent Labs  Lab 02/12/23 1442 02/13/23 0108  WBC 8.7 10.0  RBC 4.31 4.04  HGB 12.8 12.0  HCT 36.8 34.6*    MCV 85.4 85.6  MCH 29.7 29.7  MCHC 34.8 34.7  RDW 13.6 13.5  PLT 184 188   Thyroid No results for input(s): "TSH", "FREET4" in the last 168 hours.  BNPNo results for input(s): "BNP", "PROBNP" in the last 168 hours.  DDimer No results for input(s): "DDIMER" in the last 168 hours.   Radiology/Studies:  DG Chest Port 1 View  Result Date: 02/12/2023 CLINICAL DATA:  Nausea and vomiting associated with chest pressure EXAM: PORTABLE CHEST 1 VIEW COMPARISON:  Chest radiograph dated 10/04/2022 FINDINGS: Normal lung volumes. No focal consolidations. Rounded lucency projecting above the medial left hemidiaphragm. No pleural effusion or pneumothorax. Similar cardiomediastinal silhouette. No acute osseous abnormality. IMPRESSION: 1.  No focal consolidations. 2. Rounded lucency projecting above the medial left hemidiaphragm may represent a hiatal hernia. Electronically Signed   By: Limin  Xu M.D.   On: 02/12/2023 14:43     Assessment and Plan:   Unstable angina HS troponin 21 --> 21 --> 20 --> 20 EKG with anterior infarct - in the setting of a stressful event - will obtain echo - given CP, will likely need repeat LHC but will await echo first given CP during stressful event - start nitro gtt given ongoing chest pain - plan to start 5 mg amlodipine for anti-anginal effects   CAD with DES-LCX, DES-LAD, ISR 09/2022 - ASA and plavix - continue atenolol - has  not tried coreg, no tremor   Hypertensive urgency Hypertension BP 140-160s PTA: atenolol, 120 mg imdur, 10 mg lisinopril sCr 1.11, K 3.0 - has received 50 mEq K - need repeat BMP - hold lisinopril tomorrow for contrast load - will add on amlodipine for better pressure control   Hyperlipidemia with LDL goal < 55 02/13/2023: Cholesterol 105; HDL 39; LDL Cholesterol 50; Triglycerides 81; VLDL 16 Well-controlled on 20 mg crestot   IDDM Hyperglycemia Per primary   Nausea/vomiting Will keep glucose controlled Could be anginal equivalent No further vomiting since last night   Plan for heart cath this afternoon after echo.  The patient understands that risks included but are not limited to stroke (1 in 1000), death (1 in 1000), kidney failure [usually temporary] (1 in 500), bleeding (1 in 200), allergic reaction [possibly serious] (1 in 200).      Risk Assessment/Risk Scores:     TIMI Risk Score for Unstable Angina or Non-ST Elevation MI:   The patient's TIMI risk score is 6, which indicates a 41% risk of all cause mortality, new or recurrent myocardial infarction or need for urgent revascularization in the next 14 days.       For questions or updates, please contact [facility] Please consult [email or web address] for contact info under    Signed, Angela Nicole Duke, PA  02/13/2023 10:18 AM   Personally seen and examined. Agree with APP above with the following comments:  Briefly 72 F with CAD s/p PCI and ISR on DAPT, IDDM, and HLD Who presentes with chest pain after her life stressor events. Patient notes that she still have chest pain and that is never really goes away.  She has had POBA, and multiple recent interventions.  She notes that on high dose IMDUR her pain is much more bearable.  Exam notable for no murmur.  R radial +2 no hematoma. Rest as above  Labs notable for no significant elevation in tropinin EKG SR possible V2 TWI new from 2023, no significant  change Tele: SR with no ectopy Personally reviewed relevant tests;  -   Potential under-expansion of prior PCI may be the mechanism of ISR.  Would recommend  - will plan for LHC tomorrow with Dr. Jordan with IVUS or other intra-coronary imaging.  She would not be a great candidate for the drug coated balloon trial, as she has less than ideal substrate if she were to be in the DES arm.  Janijah Symons, MD FASE FACC Cardiologist [place]  [facility]  [address], [address] [place], [place] [address] [phone]  12:40 PM   

## 2023-02-13 NOTE — Plan of Care (Signed)
  Problem: Activity: Goal: Ability to tolerate increased activity will improve Outcome: Progressing   Problem: Clinical Measurements: Goal: Respiratory complications will improve Outcome: Progressing   Problem: Pain Managment: Goal: General experience of comfort will improve Outcome: Progressing

## 2023-02-14 ENCOUNTER — Inpatient Hospital Stay (HOSPITAL_COMMUNITY): Payer: PPO

## 2023-02-14 ENCOUNTER — Encounter (HOSPITAL_COMMUNITY)
Admission: EM | Disposition: A | Discharge status: Home / Self Care | Payer: Self-pay | Source: Home / Self Care | Attending: Family Medicine

## 2023-02-14 ENCOUNTER — Other Ambulatory Visit (HOSPITAL_COMMUNITY): Payer: PPO

## 2023-02-14 DIAGNOSIS — Z01810 Encounter for preprocedural cardiovascular examination: Secondary | ICD-10-CM

## 2023-02-14 DIAGNOSIS — I25110 Atherosclerotic heart disease of native coronary artery with unstable angina pectoris: Secondary | ICD-10-CM | POA: Diagnosis not present

## 2023-02-14 DIAGNOSIS — R079 Chest pain, unspecified: Secondary | ICD-10-CM | POA: Diagnosis not present

## 2023-02-14 DIAGNOSIS — E755 Other lipid storage disorders: Secondary | ICD-10-CM

## 2023-02-14 DIAGNOSIS — I119 Hypertensive heart disease without heart failure: Secondary | ICD-10-CM

## 2023-02-14 DIAGNOSIS — I200 Unstable angina: Secondary | ICD-10-CM | POA: Diagnosis not present

## 2023-02-14 DIAGNOSIS — I2510 Atherosclerotic heart disease of native coronary artery without angina pectoris: Secondary | ICD-10-CM | POA: Diagnosis not present

## 2023-02-14 HISTORY — PX: LEFT HEART CATH AND CORONARY ANGIOGRAPHY: CATH118249

## 2023-02-14 LAB — GLUCOSE, CAPILLARY
Glucose-Capillary: 124 mg/dL — ABNORMAL HIGH (ref 70–99)
Glucose-Capillary: 215 mg/dL — ABNORMAL HIGH (ref 70–99)
Glucose-Capillary: 291 mg/dL — ABNORMAL HIGH (ref 70–99)
Glucose-Capillary: 245 mg/dL — ABNORMAL HIGH (ref 70–99)

## 2023-02-14 LAB — ECHOCARDIOGRAM COMPLETE
AR max vel: 1.89 cm2
AV Area VTI: 1.8 cm2
AV Area mean vel: 1.76 cm2
AV Mean grad: 3 mmHg
AV Peak grad: 5.4 mmHg
Ao pk vel: 1.17 m/s
Area-P 1/2: 2.9 cm2
Calc EF: 62.8 %
Height: 66 in
MV VTI: 1.28 cm2
S' Lateral: 2.8 cm
Single Plane A2C EF: 63.2 %
Single Plane A4C EF: 64.1 %
Weight: 2338.64 oz

## 2023-02-14 LAB — COMPREHENSIVE METABOLIC PANEL
ALT: 14 U/L (ref 0–44)
AST: 28 U/L (ref 15–41)
Albumin: 3.2 g/dL — ABNORMAL LOW (ref 3.5–5.0)
Alkaline Phosphatase: 46 U/L (ref 38–126)
Anion gap: 11 (ref 5–15)
BUN: 17 mg/dL (ref 8–23)
CO2: 29 mmol/L (ref 22–32)
Calcium: 9.1 mg/dL (ref 8.9–10.3)
Chloride: 96 mmol/L — ABNORMAL LOW (ref 98–111)
Creatinine, Ser: 1.14 mg/dL — ABNORMAL HIGH (ref 0.44–1.00)
GFR, Estimated: 51 mL/min — ABNORMAL LOW (ref >60)
Glucose, Bld: 105 mg/dL — ABNORMAL HIGH (ref 70–99)
Potassium: 2.9 mmol/L — ABNORMAL LOW (ref 3.5–5.1)
Sodium: 136 mmol/L (ref 135–145)
Total Bilirubin: 0.6 mg/dL (ref 0.3–1.2)
Total Protein: 6.7 g/dL (ref 6.5–8.1)

## 2023-02-14 LAB — VAS US DOPPLER PRE CABG
Left ABI: 1.2
Right ABI: 1.1

## 2023-02-14 LAB — CBC
HCT: 32.1 % — ABNORMAL LOW (ref 36.0–46.0)
Hemoglobin: 11.2 g/dL — ABNORMAL LOW (ref 12.0–15.0)
MCH: 29.4 pg (ref 26.0–34.0)
MCHC: 34.9 g/dL (ref 30.0–36.0)
MCV: 84.3 fL (ref 80.0–100.0)
Platelets: 165 10*3/uL (ref 150–400)
RBC: 3.81 MIL/uL — ABNORMAL LOW (ref 3.87–5.11)
RDW: 13.4 % (ref 11.5–15.5)
WBC: 7.4 10*3/uL (ref 4.0–10.5)
nRBC: 0 % (ref 0.0–0.2)

## 2023-02-14 LAB — HEPARIN LEVEL (UNFRACTIONATED): Heparin Unfractionated: 0.33 IU/mL (ref 0.30–0.70)

## 2023-02-14 SURGERY — LEFT HEART CATH AND CORONARY ANGIOGRAPHY
Anesthesia: LOCAL

## 2023-02-14 MED ORDER — MIDAZOLAM HCL 2 MG/2ML IJ SOLN
INTRAMUSCULAR | Status: DC | PRN
  Administered 2023-02-14: 1 mg via INTRAVENOUS

## 2023-02-14 MED ORDER — SODIUM CHLORIDE 0.9 % WEIGHT BASED INFUSION: 1.0000 mL/kg/h | INTRAVENOUS | Status: DC

## 2023-02-14 MED ORDER — LABETALOL HCL 5 MG/ML IV SOLN: 10.0000 mg | INTRAVENOUS | Status: AC | PRN

## 2023-02-14 MED ORDER — HEPARIN (PORCINE) 25000 UT/250ML-% IV SOLN
800.0000 [IU]/h | INTRAVENOUS | Status: DC
  Administered 2023-02-14 – 2023-02-15 (×2): 850 [IU]/h via INTRAVENOUS
  Administered 2023-02-16 – 2023-02-17 (×2): 800 [IU]/h via INTRAVENOUS
  Filled 2023-02-14 (×4): qty 250

## 2023-02-14 MED ORDER — LIDOCAINE HCL (PF) 1 % IJ SOLN
INTRAMUSCULAR | Status: DC | PRN
  Administered 2023-02-14: 2 mL

## 2023-02-14 MED ORDER — DIPHENHYDRAMINE HCL 50 MG/ML IJ SOLN
25.0000 mg | Freq: Once | INTRAMUSCULAR | Status: AC
  Administered 2023-02-14: 25 mg via INTRAVENOUS
  Filled 2023-02-14: qty 1

## 2023-02-14 MED ORDER — ASPIRIN 81 MG PO CHEW
81.0000 mg | CHEWABLE_TABLET | ORAL | Status: AC
  Administered 2023-02-14: 81 mg via ORAL
  Filled 2023-02-14: qty 1

## 2023-02-14 MED ORDER — POTASSIUM CHLORIDE CRYS ER 20 MEQ PO TBCR
40.0000 meq | EXTENDED_RELEASE_TABLET | Freq: Once | ORAL | Status: AC
  Administered 2023-02-14: 40 meq via ORAL
  Filled 2023-02-14: qty 2

## 2023-02-14 MED ORDER — SODIUM CHLORIDE 0.9% FLUSH
3.0000 mL | Freq: Two times a day (BID) | INTRAVENOUS | Status: DC
  Administered 2023-02-14: 3 mL via INTRAVENOUS

## 2023-02-14 MED ORDER — IOHEXOL 350 MG/ML SOLN
INTRAVENOUS | Status: DC | PRN
  Administered 2023-02-14: 45 mL

## 2023-02-14 MED ORDER — HEPARIN SODIUM (PORCINE) 1000 UNIT/ML IJ SOLN
INTRAMUSCULAR | Status: DC | PRN
  Administered 2023-02-14: 3000 [IU] via INTRAVENOUS

## 2023-02-14 MED ORDER — METHYLPREDNISOLONE SODIUM SUCC 125 MG IJ SOLR
125.0000 mg | Freq: Once | INTRAMUSCULAR | Status: AC
  Administered 2023-02-14: 125 mg via INTRAVENOUS
  Filled 2023-02-14: qty 2

## 2023-02-14 MED ORDER — FENTANYL CITRATE (PF) 100 MCG/2ML IJ SOLN
INTRAMUSCULAR | Status: DC | PRN
  Administered 2023-02-14: 25 ug via INTRAVENOUS

## 2023-02-14 MED ORDER — VERAPAMIL HCL 2.5 MG/ML IV SOLN
INTRAVENOUS | Status: AC
  Filled 2023-02-14: qty 2

## 2023-02-14 MED ORDER — ATENOLOL 50 MG PO TABS
75.0000 mg | ORAL_TABLET | Freq: Two times a day (BID) | ORAL | Status: DC
  Administered 2023-02-14 – 2023-02-15 (×2): 75 mg via ORAL
  Filled 2023-02-14 (×2): qty 1

## 2023-02-14 MED ORDER — HYDRALAZINE HCL 20 MG/ML IJ SOLN: 10.0000 mg | INTRAMUSCULAR | Status: AC | PRN

## 2023-02-14 MED ORDER — FENTANYL CITRATE (PF) 100 MCG/2ML IJ SOLN
INTRAMUSCULAR | Status: AC
  Filled 2023-02-14: qty 2

## 2023-02-14 MED ORDER — HEPARIN (PORCINE) IN NACL 1000-0.9 UT/500ML-% IV SOLN
INTRAVENOUS | Status: DC | PRN
  Administered 2023-02-14 (×2): 500 mL

## 2023-02-14 MED ORDER — POTASSIUM CHLORIDE 10 MEQ/100ML IV SOLN
10.0000 meq | INTRAVENOUS | Status: AC
  Administered 2023-02-14 (×4): 10 meq via INTRAVENOUS
  Filled 2023-02-14 (×3): qty 100

## 2023-02-14 MED ORDER — MIDAZOLAM HCL 2 MG/2ML IJ SOLN
INTRAMUSCULAR | Status: AC
  Filled 2023-02-14: qty 2

## 2023-02-14 MED ORDER — SODIUM CHLORIDE 0.9% FLUSH: 3.0000 mL | INTRAVENOUS | Status: DC | PRN

## 2023-02-14 MED ORDER — VERAPAMIL HCL 2.5 MG/ML IV SOLN
INTRAVENOUS | Status: DC | PRN
  Administered 2023-02-14: 10 mL via INTRA_ARTERIAL

## 2023-02-14 MED ORDER — SODIUM CHLORIDE 0.9 % WEIGHT BASED INFUSION
3.0000 mL/kg/h | INTRAVENOUS | Status: DC
  Administered 2023-02-14: 3 mL/kg/h via INTRAVENOUS

## 2023-02-14 MED ORDER — HEPARIN SODIUM (PORCINE) 1000 UNIT/ML IJ SOLN
INTRAMUSCULAR | Status: AC
  Filled 2023-02-14: qty 10

## 2023-02-14 MED ORDER — SODIUM CHLORIDE 0.9 % WEIGHT BASED INFUSION
1.0000 mL/kg/h | INTRAVENOUS | Status: AC
  Administered 2023-02-14: 1 mL/kg/h via INTRAVENOUS

## 2023-02-14 MED ORDER — SODIUM CHLORIDE 0.9 % IV SOLN: 250.0000 mL | INTRAVENOUS | Status: DC | PRN

## 2023-02-14 MED ORDER — LIDOCAINE HCL (PF) 1 % IJ SOLN
INTRAMUSCULAR | Status: AC
  Filled 2023-02-14: qty 30

## 2023-02-14 MED ORDER — SODIUM CHLORIDE 0.9% FLUSH
3.0000 mL | Freq: Two times a day (BID) | INTRAVENOUS | Status: DC
  Administered 2023-02-14 – 2023-02-18 (×8): 3 mL via INTRAVENOUS

## 2023-02-14 SURGICAL SUPPLY — 10 items
BAND CMPR LRG ZPHR (HEMOSTASIS) ×1
BAND ZEPHYR COMPRESS 30 LONG (HEMOSTASIS) IMPLANT
CATH 5FR JL3.5 JR4 ANG PIG MP (CATHETERS) IMPLANT
GLIDESHEATH SLEND SS 6F .021 (SHEATH) IMPLANT
GUIDEWIRE INQWIRE 1.5J.035X260 (WIRE) IMPLANT
INQWIRE 1.5J .035X260CM (WIRE) ×1
KIT HEART LEFT (KITS) ×1 IMPLANT
PACK CARDIAC CATHETERIZATION (CUSTOM PROCEDURE TRAY) ×1 IMPLANT
TRANSDUCER W/STOPCOCK (MISCELLANEOUS) ×1 IMPLANT
TUBING CIL FLEX 10 FLL-RA (TUBING) ×1 IMPLANT

## 2023-02-14 NOTE — Interval H&P Note (Signed)
History and Physical Interval Note:  02/14/2023 10:01 AM  [name]  has presented today for surgery, with the diagnosis of unstable angina.  The various methods of treatment have been discussed with the patient and family. After consideration of risks, benefits and other options for treatment, the patient has consented to  Procedure(s): LEFT HEART CATH AND CORONARY ANGIOGRAPHY (N/A) as a surgical intervention.  The patient's history has been reviewed, patient examined, no change in status, stable for surgery.  I have reviewed the patient's chart and labs.  Questions were answered to the patient's satisfaction.    Cath Lab Visit (complete for each Cath Lab visit)  Clinical Evaluation Leading to the Procedure:   ACS: Yes.    Non-ACS:    Anginal Classification: CCS IV  Anti-ischemic medical therapy: Maximal Therapy (2 or more classes of medications)  Non-Invasive Test Results: No non-invasive testing performed  Prior CABG: No previous CABG       Theron Arista [facility] 02/14/2023 10:01 AM

## 2023-02-14 NOTE — Progress Notes (Signed)
ANTICOAGULATION CONSULT NOTE - Follow-Up Consult  Pharmacy Consult for heparin Indication: chest pain/ACS  Allergies  Allergen Reactions   Iohexol      Desc: pt had syncopal episode with nausea post IV CM late 1990's,  pt has had prednisone prep with heart caths x 2 without problem  kdean 04/16/07, Onset Date: [id number]    Ticlid [Ticlopidine Hcl] Nausea And Vomiting   Jardiance [Empagliflozin] Other (See Comments)    Recurrent UTIs   Metformin And Related Diarrhea   Codeine Nausea And Vomiting and Palpitations    Patient Measurements: Height: 5\' 6"  (167.6 cm) Weight: 66.3 kg (146 lb 2.6 oz) IBW/kg (Calculated) : 59.3 Heparin Dosing Weight: 65kg  Vital Signs: Temp: 98.7 F (37.1 C) (05/23 0758) Temp Source: Oral (05/23 0758) BP: 176/81 (05/23 0758) Pulse Rate: 78 (05/23 0758)  Labs: Recent Labs    02/12/23 1442 02/12/23 1606 02/12/23 1940 02/12/23 2121 02/13/23 0108 02/13/23 0952 02/13/23 1001 02/14/23 0113  HGB 12.8  --   --   --  12.0  --   --  11.2*  HCT 36.8  --   --   --  34.6*  --   --  32.1*  PLT 184  --   --   --  188  --   --  165  HEPARINUNFRC  --   --   --   --  0.85* 0.49  --  0.33  CREATININE 1.11*  --   --   --   --   --  1.24* 1.14*  TROPONINIHS 21* 21* 20* 20*  --   --   --   --      Estimated Creatinine Clearance: 40.5 mL/min (A) (by C-G formula based on SCr of 1.14 mg/dL (H)).   Medical History: Past Medical History:  Diagnosis Date   Anxiety    CAD (coronary artery disease)    DES to circumflex 02/2007, BMS to LAD and PTCA diagonal 03/2007   Carotid artery plaque    Mild   Cataract    Depression    Diverticulitis, colon    Elevated d-dimer 01/08/2014   Essential hypertension, benign    GERD (gastroesophageal reflux disease)    H/O hiatal hernia    HLD (hyperlipidemia)    IDDM (insulin dependent diabetes mellitus)    Migraine    "used to have them really bad; don't have them anymore" (01/07/2014)   MS (multiple sclerosis) (HCC)     Not confirmed   PAT (paroxysmal atrial tachycardia)    Prolapse of uterus    PVD (peripheral vascular disease) (HCC)    TIA (transient ischemic attack) 1980's    Assessment: 72 year old female presents to ED with chest pain, nausea, and vomiting. Patient does have history of CAD s/p stents. CBC within normal limits.   Heparin level remains therapeutic on 800 units/hr.  Noted plans for cath today.  Goal of Therapy:  Heparin level 0.3-0.7 units/ml Monitor platelets by anticoagulation protocol: Yes   Plan:  Continue heparin at 800 units/hr F/u after cath Daily heparin level and CBC  Exodus Kutzer, Pharm.D., BCPS Clinical Pharmacist Clinical phone for 02/14/2023 from 7:30-3:00 is [phone].  **Pharmacist phone directory can be found on [email or web address] listed under [facility].  02/14/2023 8:07 AM

## 2023-02-14 NOTE — Progress Notes (Signed)
Paged that K 2.9. Ordered for PO and IV Kcl for repletion

## 2023-02-14 NOTE — Progress Notes (Signed)
PROGRESS NOTE    [name]  ZOX:[id number] DOB: 11-Apr-1951 DOA: 02/12/2023 PCP: Gabriel Earing, FNP   Brief Narrative:  [name], a 72 y/o with medical h/o DM-on insulin,HLD, GERD and CAD. Her most recent cardiac history indicated PCI July 2023 for restenosis LAD proximal to previous stent; PCY Jan 2024 for unstable angina with PCI for instent stenosis of LAD. Last Cardiology OV note 11/21/22 she was stable. On the day of admission she experienced N/V and chest pain similar to previous episodes of angina. EMS activated. In route to [facility] she was given ASA and sl NTG.     ED Course: T 98.1  171/84  RR 18. Patient uncomfortable per EDP but hemodynamically stable. Lab K 3.0  CBC nl, Troponin 21, 21. EKG w/o STEMI or acute changes. Last ECHO 10/05/22- EF 60-65%, mild MoV regurg. TRH called to admit for continued management of unstable angina/ACS  Assessment & Plan:   Principal Problem:   Unstable angina (HCC) Active Problems:   Coronary artery disease involving native coronary artery of native heart with unstable angina pectoris (HCC)   Hypertension associated with diabetes (HCC)   Hyperlipidemia associated with type 2 diabetes mellitus (HCC)   Type 2 diabetes mellitus with hyperglycemia (HCC)   Gastroesophageal reflux disease without esophagitis   Generalized anxiety disorder  Unstable angina in a patient with history of coronary artery disease involving native coronary artery of native heart:  Patient with h/o obstructive CAD s/p Stenting x 4, last Jan 2024. She reports that her symptoms are just like previous episodes of chest pressure which was due to recurrent LAD stenosis or instent stenosis.  Started on nitroglycerin drip yesterday due to persistent chest pain.  Still complains of chest pain 6 out of 10, slightly better than yesterday.  Also has intermittent nausea but no shortness of breath or diaphoresis.  Seen by cardiology and plan for cardiac cath today.    Hypertension: Blood pressure still slightly elevated.  Continue current medications.   Generalized anxiety disorder Patient appears anxious.  Continue trazadon   Gastroesophageal reflux disease without esophagitis Continue PPI.   Type 2 diabetes mellitus with hyperglycemia (HCC) Patient on Novolog at meals, [place] daily - she adjusts her dose and Ozempic weekly. Last POC A1C 02/04/23 7.7%.  It appears that she takes 60 units of Tresiba at home.  She was started on 30 units of Semglee and SSI and blood sugar is controlled.   Hyperlipidemia associated with type 2 diabetes mellitus (HCC) LDL at goal but HDL low.  Continue Crestor.  DVT prophylaxis: Heparin drip   Code Status: Full Code  Family Communication:  None present at bedside.  Plan of care discussed with patient in length and he/she verbalized understanding and agreed with it.  Status is: Inpatient Remains inpatient appropriate because: Cardiac cath planned today  Estimated body mass index is 23.59 kg/m as calculated from the following:   Height as of this encounter: 5\' 6"  (1.676 m).   Weight as of this encounter: 66.3 kg.    Nutritional Assessment: Body mass index is 23.59 kg/m.Marland Kitchen Seen by dietician.  I agree with the assessment and plan as outlined below: Nutrition Status:        . Skin Assessment: I have examined the patient's skin and I agree with the wound assessment as performed by the wound care RN as outlined below:    Consultants:  Cardiology  Procedures:  As above  Antimicrobials:  Anti-infectives (From admission, onward)  None         Subjective: Seen and examined this morning, still with chest pressure but better than yesterday.  No other complaint.  Objective: Vitals:   02/13/23 2333 02/14/23 0415 02/14/23 0758 02/14/23 0958  BP: (!) 131/53 (!) 158/74 (!) 176/81   Pulse: 73 75 78   Resp: 17 15 16    Temp: 98.7 F (37.1 C) 98.7 F (37.1 C) 98.7 F (37.1 C)   TempSrc: Oral Oral  Oral   SpO2: 96% 97% 98% 98%  Weight:  66.3 kg    Height:        Intake/Output Summary (Last 24 hours) at 02/14/2023 1012 Last data filed at 02/14/2023 0640 Gross per 24 hour  Intake 1846.22 ml  Output --  Net 1846.22 ml    Filed Weights   02/12/23 1400 02/13/23 0207 02/14/23 0415  Weight: 65.3 kg 65.8 kg 66.3 kg    Examination:  General exam: Appears calm and comfortable  Respiratory system: Clear to auscultation. Respiratory effort normal. Cardiovascular system: S1 & S2 heard, RRR. No JVD, murmurs, rubs, gallops or clicks. No pedal edema. Gastrointestinal system: Abdomen is nondistended, soft and nontender. No organomegaly or masses felt. Normal bowel sounds heard. Central nervous system: Alert and oriented. No focal neurological deficits. Extremities: Symmetric 5 x 5 power. Skin: No rashes, lesions or ulcers.  Psychiatry: Judgement and insight appear normal. Mood & affect appropriate.   Data Reviewed: I have personally reviewed following labs and imaging studies  CBC: Recent Labs  Lab 02/12/23 1442 02/13/23 0108 02/14/23 0113  WBC 8.7 10.0 7.4  HGB 12.8 12.0 11.2*  HCT 36.8 34.6* 32.1*  MCV 85.4 85.6 84.3  PLT 184 188 165    Basic Metabolic Panel: Recent Labs  Lab 02/12/23 1442 02/13/23 1001 02/14/23 0113  NA 134* 134* 136  K 3.0* 3.4* 2.9*  CL 95* 92* 96*  CO2 27 25 29   GLUCOSE 232* 354* 105*  BUN 13 19 17   CREATININE 1.11* 1.24* 1.14*  CALCIUM 9.1 9.0 9.1    GFR: Estimated Creatinine Clearance: 40.5 mL/min (A) (by C-G formula based on SCr of 1.14 mg/dL (H)). Liver Function Tests: Recent Labs  Lab 02/14/23 0113  AST 28  ALT 14  ALKPHOS 46  BILITOT 0.6  PROT 6.7  ALBUMIN 3.2*   No results for input(s): "LIPASE", "AMYLASE" in the last 168 hours. No results for input(s): "AMMONIA" in the last 168 hours. Coagulation Profile: No results for input(s): "INR", "PROTIME" in the last 168 hours. Cardiac Enzymes: No results for input(s): "CKTOTAL",  "CKMB", "CKMBINDEX", "TROPONINI" in the last 168 hours. BNP (last 3 results) No results for input(s): "PROBNP" in the last 8760 hours. HbA1C: Recent Labs    02/13/23 1001  HGBA1C 7.8*   CBG: Recent Labs  Lab 02/13/23 0929 02/13/23 1103 02/13/23 1536 02/13/23 2109 02/14/23 0611  GLUCAP 318* 295* 278* 136* 124*   Lipid Profile: Recent Labs    02/13/23 0108  CHOL 105  HDL 39*  LDLCALC 50  TRIG 81  CHOLHDL 2.7    Thyroid Function Tests: No results for input(s): "TSH", "T4TOTAL", "FREET4", "T3FREE", "THYROIDAB" in the last 72 hours. Anemia Panel: No results for input(s): "VITAMINB12", "FOLATE", "FERRITIN", "TIBC", "IRON", "RETICCTPCT" in the last 72 hours. Sepsis Labs: No results for input(s): "PROCALCITON", "LATICACIDVEN" in the last 168 hours.  Recent Results (from the past 240 hour(s))  Urine Culture     Status: Abnormal   Collection Time: 02/04/23 10:13 AM   Specimen: Urine  UR  Result Value Ref Range Status   Urine Culture, Routine Final report (A)  Final   Organism ID, Bacteria Klebsiella pneumoniae (A)  Final    Comment: Cefazolin <=4 ug/mL Cefazolin with an MIC <=16 predicts susceptibility to the oral agents cefaclor, cefdinir, cefpodoxime, cefprozil, cefuroxime, cephalexin, and loracarbef when used for therapy of uncomplicated urinary tract infections due to E. coli, Klebsiella pneumoniae, and Proteus mirabilis. Greater than 100,000 colony forming units per mL    Antimicrobial Susceptibility Comment  Final    Comment:       ** S = Susceptible; I = Intermediate; R = Resistant **                    P = Positive; N = Negative             MICS are expressed in micrograms per mL    Antibiotic                 RSLT#1    RSLT#2    RSLT#3    RSLT#4 Amoxicillin/Clavulanic Acid    S Ampicillin                     R Cefepime                       S Ceftriaxone                    S Cefuroxime                     S Ciprofloxacin                  S Ertapenem                       S Gentamicin                     S Imipenem                       S Levofloxacin                   S Meropenem                      S Nitrofurantoin                 S Piperacillin/Tazobactam        S Tetracycline                   S Tobramycin                     S Trimethoprim/Sulfa             S      Radiology Studies: ECHOCARDIOGRAM COMPLETE  Result Date: 02/14/2023    ECHOCARDIOGRAM REPORT   Patient Name:   MARKYLA MECCA Date of Exam: 02/14/2023 Medical Rec #:  [id number]     Height:       66.0 in Accession #:    [id number]    Weight:       146.2 lb Date of Birth:  1950-12-23      BSA:          1.750 m Patient Age:    72 years      BP:  176/81 mmHg Patient Gender: F             HR:           77 bpm. Exam Location:  Inpatient Procedure: 2D Echo, Cardiac Doppler and Color Doppler Indications:    Chest Pain  History:        Patient has prior history of Echocardiogram examinations, most                 recent 10/05/2022. CHF, CAD, TIA, Stroke, CKD and PAD,                 Signs/Symptoms:Chest Pain; Risk Factors:Dyslipidemia, Diabetes                 and Hypertension.  Sonographer:    Wallie Char Referring Phys: [id number] ANGELA NICOLE DUKE  Sonographer Comments: Cath'd 10/05/22 IMPRESSIONS  1. Left ventricular ejection fraction, by estimation, is 60 to 65%. The left ventricle has normal function. The left ventricle has no regional wall motion abnormalities. There is mild concentric left ventricular hypertrophy. Left ventricular diastolic parameters are consistent with Grade I diastolic dysfunction (impaired relaxation).  2. Peak RV-RA gradient 30 mmHg. IVC not visualized. Right ventricular systolic function is normal. The right ventricular size is mildly enlarged.  3. The mitral valve is abnormal. Trivial mitral valve regurgitation. Mild mitral stenosis. The mean mitral valve gradient is 5.0 mmHg. Moderate mitral annular calcification.  4. The aortic valve is tricuspid. There is  mild calcification of the aortic valve. Aortic valve regurgitation is not visualized. No aortic stenosis is present. FINDINGS  Left Ventricle: Left ventricular ejection fraction, by estimation, is 60 to 65%. The left ventricle has normal function. The left ventricle has no regional wall motion abnormalities. The left ventricular internal cavity size was normal in size. There is  mild concentric left ventricular hypertrophy. Left ventricular diastolic parameters are consistent with Grade I diastolic dysfunction (impaired relaxation). Right Ventricle: Peak RV-RA gradient 30 mmHg. IVC not visualized. The right ventricular size is mildly enlarged. No increase in right ventricular wall thickness. Right ventricular systolic function is normal. Left Atrium: Left atrial size was normal in size. Right Atrium: Right atrial size was normal in size. Pericardium: There is no evidence of pericardial effusion. Mitral Valve: The mitral valve is abnormal. Moderate mitral annular calcification. Trivial mitral valve regurgitation. Mild mitral valve stenosis. MV peak gradient, 11.2 mmHg. The mean mitral valve gradient is 5.0 mmHg. Tricuspid Valve: The tricuspid valve is normal in structure. Tricuspid valve regurgitation is trivial. Aortic Valve: The aortic valve is tricuspid. There is mild calcification of the aortic valve. Aortic valve regurgitation is not visualized. No aortic stenosis is present. Aortic valve mean gradient measures 3.0 mmHg. Aortic valve peak gradient measures 5.4 mmHg. Aortic valve area, by VTI measures 1.80 cm. Pulmonic Valve: The pulmonic valve was normal in structure. Pulmonic valve regurgitation is not visualized. Aorta: The aortic root is normal in size and structure. Venous: The inferior vena cava was not well visualized. IAS/Shunts: No atrial level shunt detected by color flow Doppler.  LEFT VENTRICLE PLAX 2D LVIDd:         4.00 cm     Diastology LVIDs:         2.80 cm     LV e' medial:    6.27 cm/s LV PW:          1.20 cm     LV E/e' medial:  19.9 LV IVS:  1.10 cm     LV e' lateral:   6.11 cm/s LVOT diam:     1.60 cm     LV E/e' lateral: 20.5 LV SV:         49 LV SV Index:   28 LVOT Area:     2.01 cm  LV Volumes (MOD) LV vol d, MOD A2C: 45.9 ml LV vol d, MOD A4C: 51.8 ml LV vol s, MOD A2C: 16.9 ml LV vol s, MOD A4C: 18.6 ml LV SV MOD A2C:     29.0 ml LV SV MOD A4C:     51.8 ml LV SV MOD BP:      31.2 ml RIGHT VENTRICLE RV Basal diam:  4.30 cm RV S prime:     12.40 cm/s TAPSE (M-mode): 1.7 cm LEFT ATRIUM             Index        RIGHT ATRIUM           Index LA diam:        4.00 cm 2.29 cm/m   RA Area:     13.90 cm LA Vol (A2C):   35.7 ml 20.40 ml/m  RA Volume:   34.50 ml  19.71 ml/m LA Vol (A4C):   38.0 ml 21.71 ml/m LA Biplane Vol: 37.9 ml 21.65 ml/m  AORTIC VALVE AV Area (Vmax):    1.89 cm AV Area (Vmean):   1.76 cm AV Area (VTI):     1.80 cm AV Vmax:           116.50 cm/s AV Vmean:          87.300 cm/s AV VTI:            0.275 m AV Peak Grad:      5.4 mmHg AV Mean Grad:      3.0 mmHg LVOT Vmax:         109.50 cm/s LVOT Vmean:        76.250 cm/s LVOT VTI:          0.246 m LVOT/AV VTI ratio: 0.89  AORTA Ao Root diam: 3.60 cm Ao Asc diam:  3.30 cm MITRAL VALVE                TRICUSPID VALVE MV Area (PHT): 2.90 cm     TR Peak grad:   30.0 mmHg MV Area VTI:   1.28 cm     TR Vmax:        274.00 cm/s MV Peak grad:  11.2 mmHg MV Mean grad:  5.0 mmHg     SHUNTS MV Vmax:       1.67 m/s     Systemic VTI:  0.25 m MV Vmean:      107.0 cm/s   Systemic Diam: 1.60 cm MV Decel Time: 262 msec MV E velocity: 125.00 cm/s MV A velocity: 188.00 cm/s MV E/A ratio:  0.66 Dalton McleanMD Electronically signed by Wilfred Lacy Signature Date/Time: 02/14/2023/9:10:40 AM    Final    DG Chest Port 1 View  Result Date: 02/12/2023 CLINICAL DATA:  Nausea and vomiting associated with chest pressure EXAM: PORTABLE CHEST 1 VIEW COMPARISON:  Chest radiograph dated 10/04/2022 FINDINGS: Normal lung volumes. No focal consolidations.  Rounded lucency projecting above the medial left hemidiaphragm. No pleural effusion or pneumothorax. Similar cardiomediastinal silhouette. No acute osseous abnormality. IMPRESSION: 1.  No focal consolidations. 2. Rounded lucency projecting above the medial left hemidiaphragm may represent a hiatal hernia. Electronically Signed  By: Agustin Cree M.D.   On: 02/12/2023 14:43    Scheduled Meds:  [MAR Hold] amLODipine  2.5 mg Oral Daily   [MAR Hold] aspirin EC  81 mg Oral Daily   [MAR Hold] atenolol  50 mg Oral BID   [MAR Hold] clopidogrel  75 mg Oral Q breakfast   [MAR Hold] furosemide  40 mg Oral Daily   [MAR Hold] insulin aspart  0-15 Units Subcutaneous TID WC   [MAR Hold] insulin aspart  0-5 Units Subcutaneous QHS   [MAR Hold] insulin glargine-yfgn  30 Units Subcutaneous Daily   [MAR Hold] isosorbide mononitrate  120 mg Oral Daily   [MAR Hold] lisinopril  10 mg Oral Daily   [MAR Hold] potassium chloride  10 mEq Oral BID   [MAR Hold] ranolazine  500 mg Oral BID   [MAR Hold] rosuvastatin  20 mg Oral Daily   sodium chloride flush  3 mL Intravenous Q12H   Continuous Infusions:  sodium chloride Stopped (02/14/23 0530)   sodium chloride     sodium chloride 1 mL/kg/hr (02/14/23 0640)   heparin 800 Units/hr (02/13/23 1627)     LOS: 2 days   Hughie Closs, MD [facility]  02/14/2023, 10:12 AM   *Please note that this is a verbal dictation therefore any spelling or grammatical errors are due to the "Dragon Medical One" system interpretation.  Please page via Amion and do not message via secure chat for urgent patient care matters. Secure chat can be used for non urgent patient care matters.  How to contact the [facility] Attending or Consulting provider 7A - 7P or covering provider during after hours 7P -7A, for this patient?  Check the care team in [facility] and look for a) attending/consulting [facility] provider listed and b) the [facility] team listed. Page or secure chat 7A-7P. Log into [email or web address] and use  [place]'s universal password to access. If you do not have the password, please contact the hospital operator. Locate the [facility] provider you are looking for under [facility] and page to a number that you can be directly reached. If you still have difficulty reaching the provider, please page the [facility] (Director on Call) for the Hospitalists listed on amion for assistance.

## 2023-02-14 NOTE — Progress Notes (Signed)
ANTICOAGULATION CONSULT NOTE - Follow-Up Consult  Pharmacy Consult for heparin Indication: chest pain/ACS pending CVTS eval  Allergies  Allergen Reactions   Iohexol      Desc: pt had syncopal episode with nausea post IV CM late 1990's,  pt has had prednisone prep with heart caths x 2 without problem  kdean 04/16/07, Onset Date: [id number]    Ticlid [Ticlopidine Hcl] Nausea And Vomiting   Jardiance [Empagliflozin] Other (See Comments)    Recurrent UTIs   Metformin And Related Diarrhea   Codeine Nausea And Vomiting and Palpitations    Patient Measurements: Height: 5\' 6"  (167.6 cm) Weight: 66.3 kg (146 lb 2.6 oz) IBW/kg (Calculated) : 59.3 Heparin Dosing Weight: 65kg  Vital Signs: Temp: 98.3 F (36.8 C) (05/23 1219) Temp Source: Oral (05/23 1219) BP: 160/80 (05/23 1219) Pulse Rate: 86 (05/23 1219)  Labs: Recent Labs    02/12/23 1442 02/12/23 1606 02/12/23 1940 02/12/23 2121 02/13/23 0108 02/13/23 0952 02/13/23 1001 02/14/23 0113  HGB 12.8  --   --   --  12.0  --   --  11.2*  HCT 36.8  --   --   --  34.6*  --   --  32.1*  PLT 184  --   --   --  188  --   --  165  HEPARINUNFRC  --   --   --   --  0.85* 0.49  --  0.33  CREATININE 1.11*  --   --   --   --   --  1.24* 1.14*  TROPONINIHS 21* 21* 20* 20*  --   --   --   --      Estimated Creatinine Clearance: 40.5 mL/min (A) (by C-G formula based on SCr of 1.14 mg/dL (H)).   Medical History: Past Medical History:  Diagnosis Date   Anxiety    CAD (coronary artery disease)    DES to circumflex 02/2007, BMS to LAD and PTCA diagonal 03/2007   Carotid artery plaque    Mild   Cataract    Depression    Diverticulitis, colon    Elevated d-dimer 01/08/2014   Essential hypertension, benign    GERD (gastroesophageal reflux disease)    H/O hiatal hernia    HLD (hyperlipidemia)    IDDM (insulin dependent diabetes mellitus)    Migraine    "used to have them really bad; don't have them anymore" (01/07/2014)   MS (multiple  sclerosis) (HCC)    Not confirmed   PAT (paroxysmal atrial tachycardia)    Prolapse of uterus    PVD (peripheral vascular disease) (HCC)    TIA (transient ischemic attack) 1980's    Assessment: 72 year old female presents to ED with chest pain, nausea, and vomiting. Patient does have history of CAD s/p stents. CBC within normal limits.   Patient is now s/p cath with noted LAD disease and plans for CVTS consult for possible CABG.  Heparin was previously at low end of goal on 800 units/hr.  TR band removed at 1330.  Goal of Therapy:  Heparin level 0.3-0.7 units/ml Monitor platelets by anticoagulation protocol: Yes   Plan:  At 4pm, restart heparin at 850 units/hr Next heparin level with AM labs. Daily heparin level and CBC  Enis Riecke, Pharm.D., BCPS Clinical Pharmacist Clinical phone for 02/14/2023 from 7:30-3:00 is [phone].  **Pharmacist phone directory can be found on [email or web address] listed under [facility].  02/14/2023 2:23 PM

## 2023-02-14 NOTE — Progress Notes (Signed)
Progress Note  Patient Name: [name] Date of Encounter: 02/14/2023  Primary Cardiologist: Rollene Rotunda, MD   Subjective   S/p RHC; she has severe ISR.  Dr. [place] recommends TCT eval for LIMA to LAD Patient notes feeling discouraged about this  Inpatient Medications    Scheduled Meds:  amLODipine  2.5 mg Oral Daily   aspirin EC  81 mg Oral Daily   atenolol  50 mg Oral BID   furosemide  40 mg Oral Daily   insulin aspart  0-15 Units Subcutaneous TID WC   insulin aspart  0-5 Units Subcutaneous QHS   insulin glargine-yfgn  30 Units Subcutaneous Daily   isosorbide mononitrate  120 mg Oral Daily   lisinopril  10 mg Oral Daily   potassium chloride  10 mEq Oral BID   ranolazine  500 mg Oral BID   rosuvastatin  20 mg Oral Daily   sodium chloride flush  3 mL Intravenous Q12H   Continuous Infusions:  sodium chloride Stopped (02/14/23 0530)   sodium chloride     sodium chloride     PRN Meds: sodium chloride, acetaminophen, hydrALAZINE, labetalol, nitroGLYCERIN, ondansetron (ZOFRAN) IV, rOPINIRole, sodium chloride flush, traZODone   Vital Signs    Vitals:   02/14/23 1011 02/14/23 1016 02/14/23 1021 02/14/23 1026  BP: (!) 166/79  (!) 156/68 (!) 160/87  Pulse: 80 87 79 85  Resp: 19 15 19 14   Temp:      TempSrc:      SpO2: 97% 99% 98% 99%  Weight:      Height:        Intake/Output Summary (Last 24 hours) at 02/14/2023 1113 Last data filed at 02/14/2023 0640 Gross per 24 hour  Intake 1846.22 ml  Output --  Net 1846.22 ml   Filed Weights   02/12/23 1400 02/13/23 0207 02/14/23 0415  Weight: 65.3 kg 65.8 kg 66.3 kg    Telemetry    SR - Personally Reviewed  Physical Exam   Gen: no distress   Neck: No JVD Cardiac: No Rubs or Gallops, soft diastolic murmur, RRR +2 radial pulses; no radial hematoma Respiratory: Clear to auscultation bilaterally, normal effort, normal  respiratory rate GI: Soft, nontender, non-distended  MS: No  edema;  moves all  extremities Integument: Skin feels warm, good distal R arm pulses Neuro:  At time of evaluation, alert and oriented to person/place/time/situation  Psych: Normal affect, patient is discouraged   Labs    Chemistry Recent Labs  Lab 02/12/23 1442 02/13/23 1001 02/14/23 0113  NA 134* 134* 136  K 3.0* 3.4* 2.9*  CL 95* 92* 96*  CO2 27 25 29   GLUCOSE 232* 354* 105*  BUN 13 19 17   CREATININE 1.11* 1.24* 1.14*  CALCIUM 9.1 9.0 9.1  PROT  --   --  6.7  ALBUMIN  --   --  3.2*  AST  --   --  28  ALT  --   --  14  ALKPHOS  --   --  46  BILITOT  --   --  0.6  GFRNONAA 52* 46* 51*  ANIONGAP 12 17* 11     Hematology Recent Labs  Lab 02/12/23 1442 02/13/23 0108 02/14/23 0113  WBC 8.7 10.0 7.4  RBC 4.31 4.04 3.81*  HGB 12.8 12.0 11.2*  HCT 36.8 34.6* 32.1*  MCV 85.4 85.6 84.3  MCH 29.7 29.7 29.4  MCHC 34.8 34.7 34.9  RDW 13.6 13.5 13.4  PLT 184 188 165  Cardiac EnzymesNo results for input(s): "TROPONINI" in the last 168 hours. No results for input(s): "TROPIPOC" in the last 168 hours.   BNPNo results for input(s): "BNP", "PROBNP" in the last 168 hours.   DDimer No results for input(s): "DDIMER" in the last 168 hours.   Radiology    CARDIAC CATHETERIZATION  Result Date: 02/14/2023   Prox LAD to Mid LAD lesion is 99% stenosed.   1st Diag lesion is 50% stenosed.   Prox Cx to Mid Cx lesion is 25% stenosed.   Mid Cx lesion is 25% stenosed.   Prox RCA to Mid RCA lesion is 25% stenosed.   Non-stenotic Mid LAD lesion was previously treated.   The left ventricular systolic function is normal.   LV end diastolic pressure is normal.   The left ventricular ejection fraction is 55-65% by visual estimate. 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 The vessel has 2 layers of stent in this area. It has previously been treated with high pressure [place] balloon dilation 4 months ago. Normal LV function Normal LVEDP Plan: I think the LAD is poorly suited for repeat PCI. She has 2  layers of stent in a relatively small vessel. Adding a 3rd layer of stent is not advisable. Repeat POBA at this point will have a predictably short term result. Would recommend CT surgery consult for consideration of LIMA to LAD. Will hold Plavix.   ECHOCARDIOGRAM COMPLETE  Result Date: 02/14/2023    ECHOCARDIOGRAM REPORT   Patient Name:   [name] Date of Exam: 02/14/2023 Medical Rec #:  [id number]     Height:       66.0 in Accession #:    [id number]    Weight:       146.2 lb Date of Birth:  [birth year]      BSA:          1.750 m Patient Age:    72 years      BP:           176/81 mmHg Patient Gender: F             HR:           77 bpm. Exam Location:  Inpatient Procedure: 2D Echo, Cardiac Doppler and Color Doppler Indications:    Chest Pain  History:        Patient has prior history of Echocardiogram examinations, most                 recent 10/05/2022. CHF, CAD, TIA, Stroke, CKD and PAD,                 Signs/Symptoms:Chest Pain; Risk Factors:Dyslipidemia, Diabetes                 and Hypertension.  Sonographer:    Wallie Char Referring Phys: [id number] ANGELA NICOLE DUKE  Sonographer Comments: Cath'd 10/05/22 IMPRESSIONS  1. Left ventricular ejection fraction, by estimation, is 60 to 65%. The left ventricle has normal function. The left ventricle has no regional wall motion abnormalities. There is mild concentric left ventricular hypertrophy. Left ventricular diastolic parameters are consistent with Grade I diastolic dysfunction (impaired relaxation).  2. Peak RV-RA gradient 30 mmHg. IVC not visualized. Right ventricular systolic function is normal. The right ventricular size is mildly enlarged.  3. The mitral valve is abnormal. Trivial mitral valve regurgitation. Mild mitral stenosis. The mean mitral valve gradient is 5.0 mmHg. Moderate mitral annular calcification.  4. The aortic valve is tricuspid. There  is mild calcification of the aortic valve. Aortic valve regurgitation is not visualized. No aortic  stenosis is present. FINDINGS  Left Ventricle: Left ventricular ejection fraction, by estimation, is 60 to 65%. The left ventricle has normal function. The left ventricle has no regional wall motion abnormalities. The left ventricular internal cavity size was normal in size. There is  mild concentric left ventricular hypertrophy. Left ventricular diastolic parameters are consistent with Grade I diastolic dysfunction (impaired relaxation). Right Ventricle: Peak RV-RA gradient 30 mmHg. IVC not visualized. The right ventricular size is mildly enlarged. No increase in right ventricular wall thickness. Right ventricular systolic function is normal. Left Atrium: Left atrial size was normal in size. Right Atrium: Right atrial size was normal in size. Pericardium: There is no evidence of pericardial effusion. Mitral Valve: The mitral valve is abnormal. Moderate mitral annular calcification. Trivial mitral valve regurgitation. Mild mitral valve stenosis. MV peak gradient, 11.2 mmHg. The mean mitral valve gradient is 5.0 mmHg. Tricuspid Valve: The tricuspid valve is normal in structure. Tricuspid valve regurgitation is trivial. Aortic Valve: The aortic valve is tricuspid. There is mild calcification of the aortic valve. Aortic valve regurgitation is not visualized. No aortic stenosis is present. Aortic valve mean gradient measures 3.0 mmHg. Aortic valve peak gradient measures 5.4 mmHg. Aortic valve area, by VTI measures 1.80 cm. Pulmonic Valve: The pulmonic valve was normal in structure. Pulmonic valve regurgitation is not visualized. Aorta: The aortic root is normal in size and structure. Venous: The inferior vena cava was not well visualized. IAS/Shunts: No atrial level shunt detected by color flow Doppler.  LEFT VENTRICLE PLAX 2D LVIDd:         4.00 cm     Diastology LVIDs:         2.80 cm     LV e' medial:    6.27 cm/s LV PW:         1.20 cm     LV E/e' medial:  19.9 LV IVS:        1.10 cm     LV e' lateral:   6.11 cm/s  LVOT diam:     1.60 cm     LV E/e' lateral: 20.5 LV SV:         49 LV SV Index:   28 LVOT Area:     2.01 cm  LV Volumes (MOD) LV vol d, MOD A2C: 45.9 ml LV vol d, MOD A4C: 51.8 ml LV vol s, MOD A2C: 16.9 ml LV vol s, MOD A4C: 18.6 ml LV SV MOD A2C:     29.0 ml LV SV MOD A4C:     51.8 ml LV SV MOD BP:      31.2 ml RIGHT VENTRICLE RV Basal diam:  4.30 cm RV S prime:     12.40 cm/s TAPSE (M-mode): 1.7 cm LEFT ATRIUM             Index        RIGHT ATRIUM           Index LA diam:        4.00 cm 2.29 cm/m   RA Area:     13.90 cm LA Vol (A2C):   35.7 ml 20.40 ml/m  RA Volume:   34.50 ml  19.71 ml/m LA Vol (A4C):   38.0 ml 21.71 ml/m LA Biplane Vol: 37.9 ml 21.65 ml/m  AORTIC VALVE AV Area (Vmax):    1.89 cm AV Area (Vmean):   1.76 cm AV Area (VTI):  1.80 cm AV Vmax:           116.50 cm/s AV Vmean:          87.300 cm/s AV VTI:            0.275 m AV Peak Grad:      5.4 mmHg AV Mean Grad:      3.0 mmHg LVOT Vmax:         109.50 cm/s LVOT Vmean:        76.250 cm/s LVOT VTI:          0.246 m LVOT/AV VTI ratio: 0.89  AORTA Ao Root diam: 3.60 cm Ao Asc diam:  3.30 cm MITRAL VALVE                TRICUSPID VALVE MV Area (PHT): 2.90 cm     TR Peak grad:   30.0 mmHg MV Area VTI:   1.28 cm     TR Vmax:        274.00 cm/s MV Peak grad:  11.2 mmHg MV Mean grad:  5.0 mmHg     SHUNTS MV Vmax:       1.67 m/s     Systemic VTI:  0.25 m MV Vmean:      107.0 cm/s   Systemic Diam: 1.60 cm MV Decel Time: 262 msec MV E velocity: 125.00 cm/s MV A velocity: 188.00 cm/s MV E/A ratio:  0.66 Dalton McleanMD Electronically signed by Wilfred Lacy Signature Date/Time: 02/14/2023/9:10:40 AM    Final    DG Chest Port 1 View  Result Date: 02/12/2023 CLINICAL DATA:  Nausea and vomiting associated with chest pressure EXAM: PORTABLE CHEST 1 VIEW COMPARISON:  Chest radiograph dated 10/04/2022 FINDINGS: Normal lung volumes. No focal consolidations. Rounded lucency projecting above the medial left hemidiaphragm. No pleural effusion or  pneumothorax. Similar cardiomediastinal silhouette. No acute osseous abnormality. IMPRESSION: 1.  No focal consolidations. 2. Rounded lucency projecting above the medial left hemidiaphragm may represent a hiatal hernia. Electronically Signed   By: Agustin Cree M.D.   On: 02/12/2023 14:43     Patient Profile     72 y.o. 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      Riley Lam, MD FASE [facility] Cardiologist [facility]  [address] [place], [address] [place], [place] [address] [phone]  11:13 AM

## 2023-02-14 NOTE — Consult Note (Addendum)
[address]       [place] [address]             [phone]        [name] [facility] Medical Record #[id number] Date of Birth: 11-Jun-1951  Referring: No ref. provider found Primary Care: Gabriel Earing, FNP Primary Cardiologist:James Antoine Poche, MD  Chief Complaint:    Chief Complaint  Patient presents with   Emesis    History of Present Illness:    We are asked to see this72 year old female surgical consultation for consideration of coronary artery surgical revascularization.  The patient has known coronary artery disease risk factors including diabetes mellitus and hyperlipidemia.  She has had multiple previous interventions in the past beginning in 2008 when she had a drug eluting stent to left circumflex a bare-metal stent to the LAD and diagonal.  A left heart catheterization done in 2015 showed no obstructive disease in the stents.  A repeat cath in 2018 also showed patency to the stents.  She developed unstable angina in July 2023 and was found to have a mid left circumflex and 95% proximal to mid LAD lesion treated with a drug-eluting stent to the left circumflex distal to the previously placed stent and a LAD stent proximal to the previous stent.  She was again admitted in January 2024 for unstable angina and repeat heart catheterization showed severe in-stent restenosis of the LAD stent treated with an angioplasty and medically managed on aspirin and Plavix.  On 02/12/2023 she called EMS with complaints of persistent headache, nausea, vomiting and hypertensive emergency with systolic blood pressure in the 190s.  She declined ER transport at that time and no EKG was done as she reportedly was not complaining of any chest pain.  She called her primary care physician who advised for her to go to the emergency room and she went to [facility] and was found to have mild to flat troponin levels.  Due to her significant previous history of interventions she was  transferred to [facility] for further management and admission for unstable angina/hypertensive urgency.  She has had significant emotional trauma recently grieving the death of her grandchild.  EKG revealed new anterior infarct findings of undetermined age.  She did develop chest pressure.  Blood sugar was also noted to be poorly controlled with a reading in the 300s.  Her most recent hemoglobin A1c is 7.8.  High-sensitivity troponins have been flat at 20-21.  An echocardiogram has been performed on 5/23 and the full report as described below.  Her EF is 60 to 65% by visual estimation.  There was trivial mitral regurgitation noted.  There is trivial tricuspid valve regurgitation.  There is no significant aortic or pulmonic valve abnormality.  Cardiac catheterization performed today revealed proximal LAD to mid LAD lesion of 99% stenosis.  There is a 50% first diagonal stenosis but other findings were less than 50%.  She is being considered for LIMA to LAD.    Current Activity/ Functional Status: Patient is independent with mobility/ambulation, transfers, ADL's, IADL's.   Zubrod Score: At the time of surgery this patient's most appropriate activity status/level should be described as: []     0    Normal activity, no symptoms [x]     1    Restricted in physical strenuous activity but ambulatory, able to do out light work []     2    Ambulatory and capable of self care, unable to do work  activities, up and about                 more than 50%  Of the time                            []     3    Only limited self care, in bed greater than 50% of waking hours []     4    Completely disabled, no self care, confined to bed or chair []     5    Moribund  Past Medical History:  Diagnosis Date   Anxiety    CAD (coronary artery disease)    DES to circumflex 02/2007, BMS to LAD and PTCA diagonal 03/2007   Carotid artery plaque    Mild   Cataract    Depression    Diverticulitis, colon    Elevated d-dimer  01/08/2014   Essential hypertension, benign    GERD (gastroesophageal reflux disease)    H/O hiatal hernia    HLD (hyperlipidemia)    IDDM (insulin dependent diabetes mellitus)    Migraine    "used to have them really bad; don't have them anymore" (01/07/2014)   MS (multiple sclerosis) (HCC)    Not confirmed   PAT (paroxysmal atrial tachycardia)    Prolapse of uterus    PVD (peripheral vascular disease) (HCC)    TIA (transient ischemic attack) 1980's    Past Surgical History:  Procedure Laterality Date   ABDOMINAL HYSTERECTOMY  1986   ovaries remain - prolaspe uterus    APPENDECTOMY  ~ 1970   BREAST BIOPSY Right 1980's   BREAST LUMPECTOMY Right 1980's   Dr. Luan Moore    CARDIAC CATHETERIZATION  01/07/2014   CHOLECYSTECTOMY  ?1987   COLONOSCOPY  2002   Dr. Anwar--> Severe diverticular changes in the region of the sigmoid and descending colon with scattered diverticular changes throughout the rest of the colon. No polyps, ulcerations. Despite numerous manipulations, the tip of the scope could not be tipped into the cecal area.   COLONOSCOPY  01/10/2012   Procedure: COLONOSCOPY;  Surgeon: Corbin Ade, MD;  Location: [facility];  Service: Endoscopy;  Laterality: N/A;  1:55   CORONARY ANGIOPLASTY WITH STENT PLACEMENT  ~ 1997 X 2   "2 + 1"   CORONARY BALLOON ANGIOPLASTY N/A 10/05/2022   Procedure: CORONARY BALLOON ANGIOPLASTY;  Surgeon: Tonny Bollman, MD;  Location: [facility] INVASIVE CV LAB;  Service: Cardiovascular;  Laterality: N/A;   CORONARY PRESSURE/FFR STUDY N/A 03/08/2017   Procedure: Intravascular Pressure Wire/FFR Study;  Surgeon: Yvonne Kendall, MD;  Location: [facility] INVASIVE CV LAB;  Service: Cardiovascular;  Laterality: N/A;   CORONARY STENT INTERVENTION N/A 03/26/2022   Procedure: CORONARY STENT INTERVENTION;  Surgeon: Runell Gess, MD;  Location: [facility] INVASIVE CV LAB;  Service: Cardiovascular;  Laterality: N/A;   EYE SURGERY Bilateral 2014   cataract   LEFT HEART CATH AND  CORONARY ANGIOGRAPHY N/A 03/08/2017   Procedure: Left Heart Cath and Coronary Angiography;  Surgeon: Yvonne Kendall, MD;  Location: [facility] INVASIVE CV LAB;  Service: Cardiovascular;  Laterality: N/A;   LEFT HEART CATH AND CORONARY ANGIOGRAPHY N/A 03/26/2022   Procedure: LEFT HEART CATH AND CORONARY ANGIOGRAPHY;  Surgeon: Runell Gess, MD;  Location: [facility] INVASIVE CV LAB;  Service: Cardiovascular;  Laterality: N/A;   LEFT HEART CATH AND CORONARY ANGIOGRAPHY N/A 10/05/2022   Procedure: LEFT HEART CATH AND CORONARY ANGIOGRAPHY;  Surgeon: Tonny Bollman,  MD;  Location: [facility] INVASIVE CV LAB;  Service: Cardiovascular;  Laterality: N/A;   LEFT HEART CATHETERIZATION WITH CORONARY ANGIOGRAM N/A 01/07/2014   Procedure: LEFT HEART CATHETERIZATION WITH CORONARY ANGIOGRAM;  Surgeon: Laurey Morale, MD;  Location: [facility] CATH LAB;  Service: Cardiovascular;  Laterality: N/A;    Social History   Tobacco Use  Smoking Status Never  Smokeless Tobacco Never  Tobacco Comments   spouse, 41 years - husband has quit 01/2011    Social History   Substance and Sexual Activity  Alcohol Use No     Allergies  Allergen Reactions   Iohexol      Desc: pt had syncopal episode with nausea post IV CM late 1990's,  pt has had prednisone prep with heart caths x 2 without problem  kdean 04/16/07, Onset Date: [id number]    Ticlid [Ticlopidine Hcl] Nausea And Vomiting   Jardiance [Empagliflozin] Other (See Comments)    Recurrent UTIs   Metformin And Related Diarrhea   Codeine Nausea And Vomiting and Palpitations    Current Facility-Administered Medications  Medication Dose Route Frequency Provider Last Rate Last Admin   0.45 % sodium chloride infusion   Intravenous Continuous [place], Peter M, MD   Stopped at 02/14/23 0530   0.9 %  sodium chloride infusion  250 mL Intravenous PRN [place], Peter M, MD       0.9% sodium chloride infusion  1 mL/kg/hr Intravenous Continuous [place], Peter M, MD 66.3 mL/hr at 02/14/23 1113 1 mL/kg/hr  at 02/14/23 1113   acetaminophen (TYLENOL) tablet 650 mg  650 mg Oral Q4H PRN [place], Peter M, MD       amLODipine (NORVASC) tablet 2.5 mg  2.5 mg Oral Daily [place], Peter M, MD   2.5 mg at 02/14/23 5409   aspirin EC tablet 81 mg  81 mg Oral Daily [place], Peter M, MD   81 mg at 02/13/23 0931   atenolol (TENORMIN) tablet 75 mg  75 mg Oral BID Chandrasekhar, Mahesh A, MD       furosemide (LASIX) tablet 40 mg  40 mg Oral Daily [place], Peter M, MD   40 mg at 02/14/23 0815   hydrALAZINE (APRESOLINE) injection 10 mg  10 mg Intravenous Q20 Min PRN [place], Peter M, MD       insulin aspart (novoLOG) injection 0-15 Units  0-15 Units Subcutaneous TID WC [place], Peter M, MD   2 Units at 02/14/23 0631   insulin aspart (novoLOG) injection 0-5 Units  0-5 Units Subcutaneous QHS [place], Peter M, MD       insulin glargine-yfgn [facility]) injection 30 Units  30 Units Subcutaneous Daily [place], Peter M, MD   30 Units at 02/14/23 0816   isosorbide mononitrate (IMDUR) 24 hr tablet 120 mg  120 mg Oral Daily [place], Peter M, MD   120 mg at 02/14/23 0815   labetalol (NORMODYNE) injection 10 mg  10 mg Intravenous Q10 min PRN [place], Peter M, MD       lisinopril (ZESTRIL) tablet 10 mg  10 mg Oral Daily [place], Peter M, MD   10 mg at 02/14/23 0815   nitroGLYCERIN (NITROSTAT) SL tablet 0.4 mg  0.4 mg Sublingual Q5 min PRN [place], Peter M, MD   0.4 mg at 02/13/23 1828   ondansetron (ZOFRAN) injection 4 mg  4 mg Intravenous Q6H PRN [place], Peter M, MD   4 mg at 02/13/23 1828   potassium chloride (KLOR-CON M) CR tablet 10 mEq  10 mEq Oral BID [place], Peter  M, MD   10 mEq at 02/14/23 0820   ranolazine (RANEXA) 12 hr tablet 500 mg  500 mg Oral BID [place], Peter M, MD   500 mg at 02/14/23 0815   rOPINIRole (REQUIP) tablet 1 mg  1 mg Oral Daily PRN [place], Peter M, MD       rosuvastatin (CRESTOR) tablet 20 mg  20 mg Oral Daily [place], Peter M, MD   20 mg at 02/14/23 0815   sodium chloride flush (NS) 0.9 % injection 3 mL  3 mL  Intravenous Q12H [place], Peter M, MD       sodium chloride flush (NS) 0.9 % injection 3 mL  3 mL Intravenous PRN [place], Peter M, MD       traZODone (DESYREL) tablet 100 mg  100 mg Oral QHS PRN [place], Peter M, MD        Medications Prior to Admission  Medication Sig Dispense Refill Last Dose   alendronate (FOSAMAX) 70 MG tablet Take 1 tablet (70 mg total) by mouth every 7 (seven) days. Take with a full glass of water on an empty stomach. 12 tablet 3 02/06/2023   aspirin 81 MG chewable tablet Chew 1 tablet (81 mg total) by mouth daily. 90 tablet 3 02/12/2023   atenolol (TENORMIN) 50 MG tablet Take 1 tablet (50 mg total) by mouth 2 (two) times daily. 180 tablet 3 02/12/2023 at 0800   clopidogrel (PLAVIX) 75 MG tablet Take 1 tablet (75 mg total) by mouth daily with breakfast. 90 tablet 3 02/11/2023 at AM   diphenhydrAMINE (BENADRYL) 25 mg capsule Take 25 mg by mouth every 6 (six) hours as needed for allergies.   02/11/2023   furosemide (LASIX) 40 MG tablet Take 1 tablet (40 mg total) by mouth daily. 90 tablet 3 02/12/2023   insulin aspart (NOVOLOG FLEXPEN) 100 UNIT/ML FlexPen Inject 36 Units into the skin 3 (three) times daily with meals. (Patient taking differently: Inject 25-36 Units into the skin 3 (three) times daily with meals.)   02/12/2023   insulin degludec (TRESIBA FLEXTOUCH) 100 UNIT/ML FlexTouch Pen Inject 60 Units into the skin daily.   02/11/2023   isosorbide mononitrate (IMDUR) 120 MG 24 hr tablet Take 1 tablet (120 mg total) by mouth daily. 90 tablet 3 02/11/2023   lisinopril (ZESTRIL) 10 MG tablet Take 1 tablet (10 mg total) by mouth daily. 90 tablet 3 02/12/2023   nitroGLYCERIN (NITROSTAT) 0.4 MG SL tablet DISSOLVE 1 TABLET UNDER TONGUE FOR CHESTPAIN.MAY REPEAT EVERY 5 MINUTES FOR 3 DOSES.IF NO RELIEF CALL 911 OR GO TO ER 25 tablet 3 unknown   rOPINIRole (REQUIP) 0.5 MG tablet Take 2 tablets (1 mg total) by mouth daily as needed (restless leg).   Past Week   rosuvastatin (CRESTOR) 20 MG  tablet TAKE ONE (1) TABLET BY MOUTH EVERY DAY 90 tablet 0 02/12/2023   Semaglutide, 1 MG/DOSE, (OZEMPIC, 1 MG/DOSE,) 2 MG/1.5ML SOPN Inject 2 mg into the skin once a week.   02/06/2023   traZODone (DESYREL) 100 MG tablet Take 1 tablet (100 mg total) by mouth at bedtime as needed for sleep. 90 tablet 1 02/11/2023   cephALEXin (KEFLEX) 500 MG capsule Take 1 capsule (500 mg total) by mouth 2 (two) times daily. (Patient not taking: Reported on 02/12/2023) 14 capsule 0 Completed Course   Continuous Blood Gluc Sensor (FREESTYLE LIBRE 3 SENSOR) MISC Place 1 sensor on the skin every 14 days. Use to check glucose continuously. DX:e11.65 2 each 5    potassium chloride (  KLOR-CON M) 10 MEQ tablet Take 1 tablet (10 mEq total) by mouth 2 (two) times daily. (Patient not taking: Reported on 02/12/2023) 80 tablet 1 Not Taking    Family History  Problem Relation Age of Onset   Heart attack Mother 43   Diabetes Mother    Hypertension Mother    Heart attack Father 73   Heart attack Brother 32       x 6   Heart disease Brother    Diabetes Brother    Colon cancer Paternal Aunt        76s, died with brain anuerysm   Crohn's disease Cousin        paternal   Diabetes Sister    GER disease Daughter    Cervical cancer Daughter    Diabetes Daughter      Review of Systems:   Review of Systems  Constitutional:  Positive for malaise/fatigue.  HENT:  Positive for congestion.   Eyes:        Wears glasses to read  Respiratory:  Positive for cough.   Cardiovascular:  Positive for palpitations, orthopnea, claudication, leg swelling and PND.       + chest pressure  Gastrointestinal:  Positive for constipation, nausea and vomiting.  Genitourinary:        + recent UTI approx 2 weeks ago, off abx now  Musculoskeletal:  Positive for back pain, falls and neck pain.  Skin:  Positive for itching and rash.  Neurological:  Positive for dizziness.       H/O TIA's  Endo/Heme/Allergies:  Positive for environmental  allergies. Bruises/bleeds easily.  Psychiatric/Behavioral:  Positive for depression and memory loss. The patient is nervous/anxious and has insomnia.           Physical Exam: BP (!) 160/87   Pulse 85   Temp 98.7 F (37.1 C) (Oral)   Resp 14   Ht 5\' 6"  (1.676 m)   Wt 66.3 kg   SpO2 99%   BMI 23.59 kg/m    General appearance: alert, cooperative, fatigued, and no distress Head: Normocephalic, without obvious abnormality, atraumatic Neck: no adenopathy, no carotid bruit, no JVD, supple, symmetrical, trachea midline, and thyroid not enlarged, symmetric, no tenderness/mass/nodules Lymph nodes: Cervical, supraclavicular, and axillary nodes normal. Resp: clear to auscultation bilaterally Back: symmetric, no curvature. ROM normal. No CVA tenderness. Cardio: regular rate and rhythm, S1, S2 normal, no murmur, click, rub or gallop GI: soft, non-tender; bowel sounds normal; no masses,  no organomegaly Extremities: extremities normal, atraumatic, no cyanosis or edema and palpable pedal pulses Neurologic: Grossly normal  Diagnostic Studies & Laboratory data:     Recent Radiology Findings:   CARDIAC CATHETERIZATION  Result Date: 02/14/2023   Prox LAD to Mid LAD lesion is 99% stenosed.   1st Diag lesion is 50% stenosed.   Prox Cx to Mid Cx lesion is 25% stenosed.   Mid Cx lesion is 25% stenosed.   Prox RCA to Mid RCA lesion is 25% stenosed.   Non-stenotic Mid LAD lesion was previously treated.   The left ventricular systolic function is normal.   LV end diastolic pressure is normal.   The left ventricular ejection fraction is 55-65% by visual estimate. 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 The vessel has 2 layers of stent in this area. It has previously been treated with high pressure [place] balloon dilation 4 months ago. Normal LV function Normal LVEDP Plan: I think the LAD is poorly suited for repeat PCI. She has  2 layers of stent in a relatively small vessel. Adding a 3rd layer of  stent is not advisable. Repeat POBA at this point will have a predictably short term result. Would recommend CT surgery consult for consideration of LIMA to LAD. Will hold Plavix.   ECHOCARDIOGRAM COMPLETE  Result Date: 02/14/2023    ECHOCARDIOGRAM REPORT   Patient Name:   RUNELLE CHOP Date of Exam: 02/14/2023 Medical Rec #:  [id number]     Height:       66.0 in Accession #:    [id number]    Weight:       146.2 lb Date of Birth:  01-18-1951      BSA:          1.750 m Patient Age:    72 years      BP:           176/81 mmHg Patient Gender: F             HR:           77 bpm. Exam Location:  Inpatient Procedure: 2D Echo, Cardiac Doppler and Color Doppler Indications:    Chest Pain  History:        Patient has prior history of Echocardiogram examinations, most                 recent 10/05/2022. CHF, CAD, TIA, Stroke, CKD and PAD,                 Signs/Symptoms:Chest Pain; Risk Factors:Dyslipidemia, Diabetes                 and Hypertension.  Sonographer:    Wallie Char Referring Phys: [id number] ANGELA NICOLE DUKE  Sonographer Comments: Cath'd 10/05/22 IMPRESSIONS  1. Left ventricular ejection fraction, by estimation, is 60 to 65%. The left ventricle has normal function. The left ventricle has no regional wall motion abnormalities. There is mild concentric left ventricular hypertrophy. Left ventricular diastolic parameters are consistent with Grade I diastolic dysfunction (impaired relaxation).  2. Peak RV-RA gradient 30 mmHg. IVC not visualized. Right ventricular systolic function is normal. The right ventricular size is mildly enlarged.  3. The mitral valve is abnormal. Trivial mitral valve regurgitation. Mild mitral stenosis. The mean mitral valve gradient is 5.0 mmHg. Moderate mitral annular calcification.  4. The aortic valve is tricuspid. There is mild calcification of the aortic valve. Aortic valve regurgitation is not visualized. No aortic stenosis is present. FINDINGS  Left Ventricle: Left ventricular ejection  fraction, by estimation, is 60 to 65%. The left ventricle has normal function. The left ventricle has no regional wall motion abnormalities. The left ventricular internal cavity size was normal in size. There is  mild concentric left ventricular hypertrophy. Left ventricular diastolic parameters are consistent with Grade I diastolic dysfunction (impaired relaxation). Right Ventricle: Peak RV-RA gradient 30 mmHg. IVC not visualized. The right ventricular size is mildly enlarged. No increase in right ventricular wall thickness. Right ventricular systolic function is normal. Left Atrium: Left atrial size was normal in size. Right Atrium: Right atrial size was normal in size. Pericardium: There is no evidence of pericardial effusion. Mitral Valve: The mitral valve is abnormal. Moderate mitral annular calcification. Trivial mitral valve regurgitation. Mild mitral valve stenosis. MV peak gradient, 11.2 mmHg. The mean mitral valve gradient is 5.0 mmHg. Tricuspid Valve: The tricuspid valve is normal in structure. Tricuspid valve regurgitation is trivial. Aortic Valve: The aortic valve is tricuspid. There is mild calcification of the aortic valve.  Aortic valve regurgitation is not visualized. No aortic stenosis is present. Aortic valve mean gradient measures 3.0 mmHg. Aortic valve peak gradient measures 5.4 mmHg. Aortic valve area, by VTI measures 1.80 cm. Pulmonic Valve: The pulmonic valve was normal in structure. Pulmonic valve regurgitation is not visualized. Aorta: The aortic root is normal in size and structure. Venous: The inferior vena cava was not well visualized. IAS/Shunts: No atrial level shunt detected by color flow Doppler.  LEFT VENTRICLE PLAX 2D LVIDd:         4.00 cm     Diastology LVIDs:         2.80 cm     LV e' medial:    6.27 cm/s LV PW:         1.20 cm     LV E/e' medial:  19.9 LV IVS:        1.10 cm     LV e' lateral:   6.11 cm/s LVOT diam:     1.60 cm     LV E/e' lateral: 20.5 LV SV:         49 LV SV  Index:   28 LVOT Area:     2.01 cm  LV Volumes (MOD) LV vol d, MOD A2C: 45.9 ml LV vol d, MOD A4C: 51.8 ml LV vol s, MOD A2C: 16.9 ml LV vol s, MOD A4C: 18.6 ml LV SV MOD A2C:     29.0 ml LV SV MOD A4C:     51.8 ml LV SV MOD BP:      31.2 ml RIGHT VENTRICLE RV Basal diam:  4.30 cm RV S prime:     12.40 cm/s TAPSE (M-mode): 1.7 cm LEFT ATRIUM             Index        RIGHT ATRIUM           Index LA diam:        4.00 cm 2.29 cm/m   RA Area:     13.90 cm LA Vol (A2C):   35.7 ml 20.40 ml/m  RA Volume:   34.50 ml  19.71 ml/m LA Vol (A4C):   38.0 ml 21.71 ml/m LA Biplane Vol: 37.9 ml 21.65 ml/m  AORTIC VALVE AV Area (Vmax):    1.89 cm AV Area (Vmean):   1.76 cm AV Area (VTI):     1.80 cm AV Vmax:           116.50 cm/s AV Vmean:          87.300 cm/s AV VTI:            0.275 m AV Peak Grad:      5.4 mmHg AV Mean Grad:      3.0 mmHg LVOT Vmax:         109.50 cm/s LVOT Vmean:        76.250 cm/s LVOT VTI:          0.246 m LVOT/AV VTI ratio: 0.89  AORTA Ao Root diam: 3.60 cm Ao Asc diam:  3.30 cm MITRAL VALVE                TRICUSPID VALVE MV Area (PHT): 2.90 cm     TR Peak grad:   30.0 mmHg MV Area VTI:   1.28 cm     TR Vmax:        274.00 cm/s MV Peak grad:  11.2 mmHg MV Mean grad:  5.0 mmHg     SHUNTS MV  Vmax:       1.67 m/s     Systemic VTI:  0.25 m MV Vmean:      107.0 cm/s   Systemic Diam: 1.60 cm MV Decel Time: 262 msec MV E velocity: 125.00 cm/s MV A velocity: 188.00 cm/s MV E/A ratio:  0.66 Dalton McleanMD Electronically signed by Wilfred Lacy Signature Date/Time: 02/14/2023/9:10:40 AM    Final    DG Chest Port 1 View  Result Date: 02/12/2023 CLINICAL DATA:  Nausea and vomiting associated with chest pressure EXAM: PORTABLE CHEST 1 VIEW COMPARISON:  Chest radiograph dated 10/04/2022 FINDINGS: Normal lung volumes. No focal consolidations. Rounded lucency projecting above the medial left hemidiaphragm. No pleural effusion or pneumothorax. Similar cardiomediastinal silhouette. No acute osseous  abnormality. IMPRESSION: 1.  No focal consolidations. 2. Rounded lucency projecting above the medial left hemidiaphragm may represent a hiatal hernia. Electronically Signed   By: Agustin Cree M.D.   On: 02/12/2023 14:43     I have independently reviewed the above radiologic studies and discussed with the patient   Recent Lab Findings: Lab Results  Component Value Date   WBC 7.4 02/14/2023   HGB 11.2 (L) 02/14/2023   HCT 32.1 (L) 02/14/2023   PLT 165 02/14/2023   GLUCOSE 105 (H) 02/14/2023   CHOL 105 02/13/2023   TRIG 81 02/13/2023   HDL 39 (L) 02/13/2023   LDLDIRECT 93 04/06/2015   LDLCALC 50 02/13/2023   ALT 14 02/14/2023   AST 28 02/14/2023   NA 136 02/14/2023   K 2.9 (L) 02/14/2023   CL 96 (L) 02/14/2023   CREATININE 1.14 (H) 02/14/2023   BUN 17 02/14/2023   CO2 29 02/14/2023   TSH 2.110 12/11/2021   INR 1.1 11/15/2020   HGBA1C 7.8 (H) 02/13/2023      Assessment / Plan: Severe single-vessel coronary artery disease status post multiple previous percutaneous interventions. Hyperlipidemia Hypertension-currently poorly controlled History of paroxysmal atrial tachycardia DM 2-insulin-dependent, fair control History of GERD/hiatal hernia Anxiety and depression was acute exacerbation related to the death of grandchild Diverticulitis History of TIA  Plan: She appears to be a candidate for CABG.  The surgeon will evaluate the patient and relevant studies make final determination of timing and feasibility.        I  spent 40 minutes counseling the patient face to face.  Rowe Clack, PA-C  02/14/2023 11:48 AM     Agree with above OR on Tuesday for CABG 1  [facility] O Lemuel Boodram

## 2023-02-14 NOTE — Progress Notes (Signed)
Pre-CABG vascular studies completed.   Please see CV Procedures for preliminary results.  Doree Kuehne, RVT  3:45 PM 02/14/23

## 2023-02-15 ENCOUNTER — Encounter (HOSPITAL_COMMUNITY): Payer: Self-pay | Admitting: Cardiology

## 2023-02-15 DIAGNOSIS — I200 Unstable angina: Secondary | ICD-10-CM | POA: Diagnosis not present

## 2023-02-15 LAB — BASIC METABOLIC PANEL
Anion gap: 11 (ref 5–15)
BUN: 16 mg/dL (ref 8–23)
CO2: 24 mmol/L (ref 22–32)
Calcium: 9.4 mg/dL (ref 8.9–10.3)
Chloride: 100 mmol/L (ref 98–111)
Creatinine, Ser: 1.05 mg/dL — ABNORMAL HIGH (ref 0.44–1.00)
GFR, Estimated: 56 mL/min — ABNORMAL LOW (ref >60)
Glucose, Bld: 186 mg/dL — ABNORMAL HIGH (ref 70–99)
Potassium: 4 mmol/L (ref 3.5–5.1)
Sodium: 135 mmol/L (ref 135–145)

## 2023-02-15 LAB — GLUCOSE, CAPILLARY
Glucose-Capillary: 209 mg/dL — ABNORMAL HIGH (ref 70–99)
Glucose-Capillary: 334 mg/dL — ABNORMAL HIGH (ref 70–99)
Glucose-Capillary: 184 mg/dL — ABNORMAL HIGH (ref 70–99)
Glucose-Capillary: 140 mg/dL — ABNORMAL HIGH (ref 70–99)

## 2023-02-15 LAB — CBC
HCT: 33.7 % — ABNORMAL LOW (ref 36.0–46.0)
Hemoglobin: 11.6 g/dL — ABNORMAL LOW (ref 12.0–15.0)
MCH: 29.4 pg (ref 26.0–34.0)
MCHC: 34.4 g/dL (ref 30.0–36.0)
MCV: 85.5 fL (ref 80.0–100.0)
Platelets: 165 10*3/uL (ref 150–400)
RBC: 3.94 MIL/uL (ref 3.87–5.11)
RDW: 13.4 % (ref 11.5–15.5)
WBC: 10.9 10*3/uL — ABNORMAL HIGH (ref 4.0–10.5)
nRBC: 0 % (ref 0.0–0.2)

## 2023-02-15 LAB — HEPARIN LEVEL (UNFRACTIONATED)
Heparin Unfractionated: 0.38 IU/mL (ref 0.30–0.70)
Heparin Unfractionated: 0.38 IU/mL (ref 0.30–0.70)

## 2023-02-15 LAB — SURGICAL PCR SCREEN
MRSA, PCR: NEGATIVE
Staphylococcus aureus: NEGATIVE

## 2023-02-15 MED ORDER — TRANEXAMIC ACID 1000 MG/10ML IV SOLN
1.5000 mg/kg/h | INTRAVENOUS | Status: AC
  Administered 2023-02-19: 1.5 mg/kg/h via INTRAVENOUS
  Filled 2023-02-15: qty 25

## 2023-02-15 MED ORDER — DEXMEDETOMIDINE HCL IN NACL 400 MCG/100ML IV SOLN
0.1000 ug/kg/h | INTRAVENOUS | Status: AC
  Administered 2023-02-19: .7 ug/kg/h via INTRAVENOUS
  Filled 2023-02-15: qty 100

## 2023-02-15 MED ORDER — MANNITOL 20 % IV SOLN
INTRAVENOUS | Status: DC
  Filled 2023-02-15 (×2): qty 13

## 2023-02-15 MED ORDER — NOREPINEPHRINE 4 MG/250ML-% IV SOLN
0.0000 ug/min | INTRAVENOUS | Status: AC
  Administered 2023-02-19: 2 ug/min via INTRAVENOUS
  Filled 2023-02-15: qty 250

## 2023-02-15 MED ORDER — MILRINONE LACTATE IN DEXTROSE 20-5 MG/100ML-% IV SOLN
0.3000 ug/kg/min | INTRAVENOUS | Status: DC
  Filled 2023-02-15: qty 100

## 2023-02-15 MED ORDER — PHENYLEPHRINE HCL-NACL 20-0.9 MG/250ML-% IV SOLN
30.0000 ug/min | INTRAVENOUS | Status: AC
  Administered 2023-02-19: 25 ug/min via INTRAVENOUS
  Filled 2023-02-15: qty 250

## 2023-02-15 MED ORDER — CEFAZOLIN SODIUM-DEXTROSE 2-4 GM/100ML-% IV SOLN
2.0000 g | INTRAVENOUS | Status: AC
  Administered 2023-02-19: 2 g via INTRAVENOUS
  Filled 2023-02-15: qty 100

## 2023-02-15 MED ORDER — INSULIN GLARGINE-YFGN 100 UNIT/ML ~~LOC~~ SOLN
10.0000 [IU] | Freq: Once | SUBCUTANEOUS | Status: AC
  Administered 2023-02-15: 10 [IU] via SUBCUTANEOUS
  Filled 2023-02-15: qty 0.1

## 2023-02-15 MED ORDER — DOCUSATE SODIUM 100 MG PO CAPS
100.0000 mg | ORAL_CAPSULE | Freq: Two times a day (BID) | ORAL | Status: DC
  Administered 2023-02-15 – 2023-02-18 (×8): 100 mg via ORAL
  Filled 2023-02-15 (×8): qty 1

## 2023-02-15 MED ORDER — NITROGLYCERIN IN D5W 200-5 MCG/ML-% IV SOLN
2.0000 ug/min | INTRAVENOUS | Status: DC
  Filled 2023-02-15: qty 250

## 2023-02-15 MED ORDER — POLYETHYLENE GLYCOL 3350 17 G PO PACK
17.0000 g | PACK | Freq: Every day | ORAL | Status: DC
  Administered 2023-02-15 – 2023-02-18 (×3): 17 g via ORAL
  Filled 2023-02-15 (×3): qty 1

## 2023-02-15 MED ORDER — INSULIN REGULAR(HUMAN) IN NACL 100-0.9 UT/100ML-% IV SOLN
INTRAVENOUS | Status: AC
  Administered 2023-02-19: 1.6 [IU]/h via INTRAVENOUS
  Filled 2023-02-15: qty 100

## 2023-02-15 MED ORDER — ATENOLOL 100 MG PO TABS
100.0000 mg | ORAL_TABLET | Freq: Two times a day (BID) | ORAL | Status: DC
  Administered 2023-02-15 – 2023-02-18 (×7): 100 mg via ORAL
  Filled 2023-02-15 (×8): qty 1

## 2023-02-15 MED ORDER — HEPARIN 30,000 UNITS/1000 ML (OHS) CELLSAVER SOLUTION
Status: DC
  Filled 2023-02-15: qty 1000

## 2023-02-15 MED ORDER — TRANEXAMIC ACID (OHS) BOLUS VIA INFUSION
15.0000 mg/kg | INTRAVENOUS | Status: AC
  Administered 2023-02-19: 996 mg via INTRAVENOUS
  Filled 2023-02-15: qty 996

## 2023-02-15 MED ORDER — INSULIN GLARGINE-YFGN 100 UNIT/ML ~~LOC~~ SOLN
40.0000 [IU] | Freq: Every day | SUBCUTANEOUS | Status: DC
  Administered 2023-02-16: 40 [IU] via SUBCUTANEOUS
  Filled 2023-02-15 (×2): qty 0.4

## 2023-02-15 MED ORDER — PLASMA-LYTE A IV SOLN
INTRAVENOUS | Status: DC
  Filled 2023-02-15: qty 2.5

## 2023-02-15 MED ORDER — EPINEPHRINE HCL 5 MG/250ML IV SOLN IN NS
0.0000 ug/min | INTRAVENOUS | Status: DC
  Filled 2023-02-15: qty 250

## 2023-02-15 MED ORDER — POTASSIUM CHLORIDE 2 MEQ/ML IV SOLN
80.0000 meq | INTRAVENOUS | Status: DC
  Filled 2023-02-15: qty 40

## 2023-02-15 MED ORDER — TRANEXAMIC ACID (OHS) PUMP PRIME SOLUTION
2.0000 mg/kg | INTRAVENOUS | Status: DC
  Filled 2023-02-15: qty 1.33

## 2023-02-15 MED ORDER — CEFAZOLIN SODIUM-DEXTROSE 2-4 GM/100ML-% IV SOLN
2.0000 g | INTRAVENOUS | Status: DC
  Filled 2023-02-15: qty 100

## 2023-02-15 MED ORDER — VANCOMYCIN HCL 1250 MG/250ML IV SOLN
1250.0000 mg | INTRAVENOUS | Status: AC
  Administered 2023-02-19: 1250 mg via INTRAVENOUS
  Filled 2023-02-15: qty 250

## 2023-02-15 NOTE — Progress Notes (Signed)
ANTICOAGULATION CONSULT NOTE  Pharmacy Consult for heparin Indication: chest pain/ACS pending CVTS eval  Allergies  Allergen Reactions   Iohexol      Desc: pt had syncopal episode with nausea post IV CM late 1990's,  pt has had prednisone prep with heart caths x 2 without problem  kdean 04/16/07, Onset Date: [id number]    Ticlid [Ticlopidine Hcl] Nausea And Vomiting   Jardiance [Empagliflozin] Other (See Comments)    Recurrent UTIs   Metformin And Related Diarrhea   Codeine Nausea And Vomiting and Palpitations    Patient Measurements: Height: 5\' 6"  (167.6 cm) Weight: 66.4 kg (146 lb 4.8 oz) IBW/kg (Calculated) : 59.3 Heparin Dosing Weight: 65 kg  Vital Signs: Temp: 98.3 F (36.8 C) (05/24 0407) Temp Source: Oral (05/24 0407) BP: 153/60 (05/24 0730) Pulse Rate: 80 (05/24 0730)  Labs: Recent Labs    02/12/23 1442 02/12/23 1606 02/12/23 1940 02/12/23 2121 02/13/23 0108 02/13/23 0952 02/13/23 1001 02/14/23 0113 02/15/23 0059  HGB  --   --   --   --  12.0  --   --  11.2* 11.6*  HCT  --   --   --   --  34.6*  --   --  32.1* 33.7*  PLT  --   --   --   --  188  --   --  165 165  HEPARINUNFRC   < >  --   --   --  0.85* 0.49  --  0.33 0.38  CREATININE  --   --   --   --   --   --  1.24* 1.14* 1.05*  TROPONINIHS  --  21* 20* 20*  --   --   --   --   --    < > = values in this interval not displayed.     Estimated Creatinine Clearance: 44 mL/min (A) (by C-G formula based on SCr of 1.05 mg/dL (H)).   Medical History: Past Medical History:  Diagnosis Date   Anxiety    CAD (coronary artery disease)    DES to circumflex 02/2007, BMS to LAD and PTCA diagonal 03/2007   Carotid artery plaque    Mild   Cataract    Depression    Diverticulitis, colon    Elevated d-dimer 01/08/2014   Essential hypertension, benign    GERD (gastroesophageal reflux disease)    H/O hiatal hernia    HLD (hyperlipidemia)    IDDM (insulin dependent diabetes mellitus)    Migraine    "used to  have them really bad; don't have them anymore" (01/07/2014)   MS (multiple sclerosis) (HCC)    Not confirmed   PAT (paroxysmal atrial tachycardia)    Prolapse of uterus    PVD (peripheral vascular disease) (HCC)    TIA (transient ischemic attack) 1980's    Assessment: 72 year old female presents to ED with chest pain, nausea, and vomiting. Patient does have history of CAD s/p stents. CBC within normal limits.   Patient is now s/p cath with noted LAD disease and plans for CVTS consult for possible CABG.  Heparin was previously at low end of goal on 800 units/hr.  TR band removed at 1330. Heparin resumed 5/23 @1600  on 850 units/hr.  Goal of Therapy:  Heparin level 0.3-0.7 units/ml Monitor platelets by anticoagulation protocol: Yes   Plan:  Continue heparin infusion at 850 units/hr Check heparin level in 8 hours and daily while on heparin Continue to monitor H&H and  platelets  Thank you for allowing pharmacy to be a part of this patient's care.  Thelma Barge, PharmD Clinical Pharmacist

## 2023-02-15 NOTE — Progress Notes (Signed)
ANTICOAGULATION CONSULT NOTE  Pharmacy Consult for heparin Indication: chest pain/ACS pending CVTS eval  Allergies  Allergen Reactions   Iohexol      Desc: pt had syncopal episode with nausea post IV CM late 1990's,  pt has had prednisone prep with heart caths x 2 without problem  kdean 04/16/07, Onset Date: [id number]    Ticlid [Ticlopidine Hcl] Nausea And Vomiting   Jardiance [Empagliflozin] Other (See Comments)    Recurrent UTIs   Metformin And Related Diarrhea   Codeine Nausea And Vomiting and Palpitations    Patient Measurements: Height: 5\' 6"  (167.6 cm) Weight: 66.4 kg (146 lb 4.8 oz) IBW/kg (Calculated) : 59.3 Heparin Dosing Weight: 65 kg  Vital Signs: Temp: 98.3 F (36.8 C) (05/24 0407) Temp Source: Oral (05/24 0407) BP: 153/60 (05/24 0730) Pulse Rate: 80 (05/24 0730)  Labs: Recent Labs    02/12/23 1442 02/12/23 1606 02/12/23 1940 02/12/23 2121 02/13/23 0108 02/13/23 0952 02/13/23 1001 02/14/23 0113 02/15/23 0059  HGB  --   --   --   --  12.0  --   --  11.2* 11.6*  HCT  --   --   --   --  34.6*  --   --  32.1* 33.7*  PLT  --   --   --   --  188  --   --  165 165  HEPARINUNFRC   < >  --   --   --  0.85* 0.49  --  0.33 0.38  CREATININE  --   --   --   --   --   --  1.24* 1.14* 1.05*  TROPONINIHS  --  21* 20* 20*  --   --   --   --   --    < > = values in this interval not displayed.    Estimated Creatinine Clearance: 44 mL/min (A) (by C-G formula based on SCr of 1.05 mg/dL (H)).   Medical History: Past Medical History:  Diagnosis Date   Anxiety    CAD (coronary artery disease)    DES to circumflex 02/2007, BMS to LAD and PTCA diagonal 03/2007   Carotid artery plaque    Mild   Cataract    Depression    Diverticulitis, colon    Elevated d-dimer 01/08/2014   Essential hypertension, benign    GERD (gastroesophageal reflux disease)    H/O hiatal hernia    HLD (hyperlipidemia)    IDDM (insulin dependent diabetes mellitus)    Migraine    "used to have  them really bad; don't have them anymore" (01/07/2014)   MS (multiple sclerosis) (HCC)    Not confirmed   PAT (paroxysmal atrial tachycardia)    Prolapse of uterus    PVD (peripheral vascular disease) (HCC)    TIA (transient ischemic attack) 1980's    Assessment: 72 year old female presents to ED with chest pain, nausea, and vomiting. Patient does have history of CAD s/p stents. CBC within normal limits.   Patient is now s/p cath with noted LAD disease and plans for CVTS consult for possible CABG.  Heparin was previously at low end of goal on 800 units/hr.  TR band removed at 1330. Heparin resumed 5/23 @1600  on 850 units/hr.  ***  Goal of Therapy:  Heparin level 0.3-0.7 units/ml Monitor platelets by anticoagulation protocol: Yes   Plan:  *** heparin infusion at *** units/hr Check heparin level in *** hours and daily while on heparin Continue to monitor H&H  and platelets  Thank you for allowing pharmacy to be a part of this patient's care.  Thelma Barge, PharmD Clinical Pharmacist

## 2023-02-15 NOTE — Inpatient Diabetes Management (Signed)
Inpatient Diabetes Program Recommendations  AACE/ADA: New Consensus Statement on Inpatient Glycemic Control (2015)  Target Ranges:  Prepandial:   less than 140 mg/dL      Peak postprandial:   less than 180 mg/dL (1-2 hours)      Critically ill patients:  140 - 180 mg/dL   Lab Results  Component Value Date   GLUCAP 334 (H) 02/15/2023   HGBA1C 7.8 (H) 02/13/2023    Review of Glycemic Control  Latest Reference Range & Units 02/14/23 06:11 02/14/23 12:05 02/14/23 16:10 02/14/23 20:45 02/15/23 06:10 02/15/23 11:12  Glucose-Capillary 70 - 99 mg/dL 161 (H) 096 (H) 045 (H) 245 (H) 209 (H) 334 (H)  (H): Data is abnormally high  Diabetes history: DM2 Outpatient Diabetes medications: Tresiba 60 units QD, Novolog 25-36 units TID, Ozempic 2 mg weekly, Freestyle Libre 3 Current orders for Inpatient glycemic control: Semglee 40 units QD, Novolog 0-15 units TID and 0-5 units QHS  Inpatient Diabetes Program Recommendations:    Please consider:  Novolog 3 units TID with meals if she consumes at least 50% as her postprandials are elevated.  Noted Basal insulin was increased today.    Will continue to follow while inpatient.  Thank you, Dulce Sellar, MSN, CDCES Diabetes Coordinator Inpatient Diabetes Program [phone] (team pager from 8a-5p)

## 2023-02-15 NOTE — Progress Notes (Signed)
CARDIAC REHAB PHASE     Pre-op [facility] education including [facility] handout, [facility] booklet, IS use, home needs at discharge and mobility importance. All questions and concerns addressed. Will continue to follow.   [id number] Woodroe Chen, RN BSN 02/15/2023 10:26 AM

## 2023-02-15 NOTE — Progress Notes (Signed)
PROGRESS NOTE    [name]  XBJ:[id number] DOB: Jan 23, 1951 DOA: 02/12/2023 PCP: Gabriel Earing, FNP   Brief Narrative:  [name], a 72 y/o with medical h/o DM-on insulin,HLD, GERD and CAD. Her most recent cardiac history indicated PCI July 2023 for restenosis LAD proximal to previous stent; PCY Jan 2024 for unstable angina with PCI for instent stenosis of LAD. Last Cardiology OV note 11/21/22 she was stable. On the day of admission she experienced N/V and chest pain similar to previous episodes of angina. EMS activated. In route to [facility] she was given ASA and sl NTG.     ED Course: T 98.1  171/84  RR 18. Patient uncomfortable per EDP but hemodynamically stable. Lab K 3.0  CBC nl, Troponin 21, 21. EKG w/o STEMI or acute changes. Last ECHO 10/05/22- EF 60-65%, mild MoV regurg. TRH called to admit for continued management of unstable angina/ACS  Assessment & Plan:   Principal Problem:   Unstable angina (HCC) Active Problems:   Coronary artery disease involving native coronary artery of native heart with unstable angina pectoris (HCC)   Hypertension associated with diabetes (HCC)   Hyperlipidemia associated with type 2 diabetes mellitus (HCC)   Type 2 diabetes mellitus with hyperglycemia (HCC)   Gastroesophageal reflux disease without esophagitis   Generalized anxiety disorder  Unstable angina in a patient with history of coronary artery disease involving native coronary artery of native heart:  Patient with h/o obstructive CAD s/p Stenting x 4, last Jan 2024. She reports that her symptoms are just like previous episodes of chest pressure which was due to recurrent LAD stenosis or instent stenosis.  Started on nitroglycerin drip due to persistent chest pain.  Still complains of chest pressure 7 out of 10.  Underwent cardiac cath 02/14/2023 and was found to have severe single-vessel LAD CAD and since she has had multiple previous percutaneous interventions and further interventions  were not recommended by cardiology.  They consulted CT surgery for possible CABG consideration.  Patient pending evaluation by CT surgeon.    Hypertension: Blood pressure controlled.  Continue current medications.   Generalized anxiety disorder  Continue trazadon   Gastroesophageal reflux disease without esophagitis Continue PPI.   Type 2 diabetes mellitus with hyperglycemia (HCC) Patient on Novolog at meals, [place] daily - she adjusts her dose and Ozempic weekly. Last A1C 02/13/23 7.8%.  It appears that she takes 60 units of Tresiba at home.  She was started on 30 units of Semglee, slightly hyperglycemic, increase to 40 units.  Continue SSI.   Hyperlipidemia associated with type 2 diabetes mellitus (HCC) LDL at goal but HDL low.  Continue Crestor.  DVT prophylaxis: Heparin drip   Code Status: Full Code  Family Communication:  None present at bedside.  Plan of care discussed with patient in length and he/she verbalized understanding and agreed with it.  Status is: Inpatient Remains inpatient appropriate because: Cardiac cath planned today  Estimated body mass index is 23.61 kg/m as calculated from the following:   Height as of this encounter: 5\' 6"  (1.676 m).   Weight as of this encounter: 66.4 kg.    Nutritional Assessment: Body mass index is 23.61 kg/m.Marland Kitchen Seen by dietician.  I agree with the assessment and plan as outlined below: Nutrition Status:        . Skin Assessment: I have examined the patient's skin and I agree with the wound assessment as performed by the wound care RN as outlined below:  Consultants:  Cardiology  Procedures:  As above  Antimicrobials:  Anti-infectives (From admission, onward)    None         Subjective: Patient seen and examined.  Still complains of chest pressure 7 out of 10.  No other complaint.  Objective: Vitals:   02/14/23 2009 02/15/23 0037 02/15/23 0407 02/15/23 0730  BP: (!) 158/74 (!) 168/77 (!) 141/76 (!) 153/60   Pulse: 88 82 80 80  Resp: 18 16 14 16   Temp: 98.3 F (36.8 C) 98.3 F (36.8 C) 98.3 F (36.8 C)   TempSrc: Oral Oral Oral   SpO2: 97% 96% 98% 99%  Weight:   66.4 kg   Height:        Intake/Output Summary (Last 24 hours) at 02/15/2023 1014 Last data filed at 02/15/2023 0454 Gross per 24 hour  Intake 1027.17 ml  Output 500 ml  Net 527.17 ml    Filed Weights   02/13/23 0207 02/14/23 0415 02/15/23 0407  Weight: 65.8 kg 66.3 kg 66.4 kg    Examination:  General exam: Appears calm and comfortable  Respiratory system: Clear to auscultation. Respiratory effort normal. Cardiovascular system: S1 & S2 heard, RRR. No JVD, murmurs, rubs, gallops or clicks. No pedal edema. Gastrointestinal system: Abdomen is nondistended, soft and nontender. No organomegaly or masses felt. Normal bowel sounds heard. Central nervous system: Alert and oriented. No focal neurological deficits. Extremities: Symmetric 5 x 5 power. Skin: No rashes, lesions or ulcers.  Psychiatry: Judgement and insight appear normal. Mood & affect appropriate.    Data Reviewed: I have personally reviewed following labs and imaging studies  CBC: Recent Labs  Lab 02/12/23 1442 02/13/23 0108 02/14/23 0113 02/15/23 0059  WBC 8.7 10.0 7.4 10.9*  HGB 12.8 12.0 11.2* 11.6*  HCT 36.8 34.6* 32.1* 33.7*  MCV 85.4 85.6 84.3 85.5  PLT 184 188 165 165    Basic Metabolic Panel: Recent Labs  Lab 02/12/23 1442 02/13/23 1001 02/14/23 0113 02/15/23 0059  NA 134* 134* 136 135  K 3.0* 3.4* 2.9* 4.0  CL 95* 92* 96* 100  CO2 27 25 29 24   GLUCOSE 232* 354* 105* 186*  BUN 13 19 17 16   CREATININE 1.11* 1.24* 1.14* 1.05*  CALCIUM 9.1 9.0 9.1 9.4    GFR: Estimated Creatinine Clearance: 44 mL/min (A) (by C-G formula based on SCr of 1.05 mg/dL (H)). Liver Function Tests: Recent Labs  Lab 02/14/23 0113  AST 28  ALT 14  ALKPHOS 46  BILITOT 0.6  PROT 6.7  ALBUMIN 3.2*    No results for input(s): "LIPASE", "AMYLASE" in  the last 168 hours. No results for input(s): "AMMONIA" in the last 168 hours. Coagulation Profile: No results for input(s): "INR", "PROTIME" in the last 168 hours. Cardiac Enzymes: No results for input(s): "CKTOTAL", "CKMB", "CKMBINDEX", "TROPONINI" in the last 168 hours. BNP (last 3 results) No results for input(s): "PROBNP" in the last 8760 hours. HbA1C: Recent Labs    02/13/23 1001  HGBA1C 7.8*    CBG: Recent Labs  Lab 02/14/23 0611 02/14/23 1205 02/14/23 1610 02/14/23 2045 02/15/23 0610  GLUCAP 124* 215* 291* 245* 209*    Lipid Profile: Recent Labs    02/13/23 0108  CHOL 105  HDL 39*  LDLCALC 50  TRIG 81  CHOLHDL 2.7    Thyroid Function Tests: No results for input(s): "TSH", "T4TOTAL", "FREET4", "T3FREE", "THYROIDAB" in the last 72 hours. Anemia Panel: No results for input(s): "VITAMINB12", "FOLATE", "FERRITIN", "TIBC", "IRON", "RETICCTPCT" in the last 72  hours. Sepsis Labs: No results for input(s): "PROCALCITON", "LATICACIDVEN" in the last 168 hours.  No results found for this or any previous visit (from the past 240 hour(s)).    Radiology Studies: VAS US DOPPLER PRE CABG  Result Date: 02/14/2023 PREOPERATIVE VASCULAR EVALUATION Patient Name:  [name]  Date of Exam:   02/14/2023 Medical Rec #: [id number]      Accession #:    [id number] Date of Birth: Apr 08, 1951       Patient Gender: F Patient Age:   35 years Exam Location:  [facility] Procedure:      VAS US DOPPLER PRE CABG Referring Phys: Eugenio Hoes --------------------------------------------------------------------------------  Indications:      Pre-CABG. Risk Factors:     Hypertension, hyperlipidemia, prior MI, coronary artery                   disease, prior CVA. Comparison Study: Previous carotid 09/02/2015. Performing Technologist: McKayla Maag RVT, [place]  Examination Guidelines: A complete evaluation includes B-mode imaging, spectral Doppler, color Doppler, and power Doppler as needed of all  accessible portions of each vessel. Bilateral testing is considered an integral part of a complete examination. Limited examinations for reoccurring indications may be performed as noted.  Right Carotid Findings: +----------+--------+--------+--------+-------------------------------+--------+           PSV cm/sEDV cm/sStenosisDescribe                       Comments +----------+--------+--------+--------+-------------------------------+--------+ CCA Prox  67      9                                                       +----------+--------+--------+--------+-------------------------------+--------+ CCA Distal66      8                                                       +----------+--------+--------+--------+-------------------------------+--------+ ICA Prox  71      17      1-39%   smooth, calcific and                                                      homogeneous                             +----------+--------+--------+--------+-------------------------------+--------+ ICA Distal68      19                                                      +----------+--------+--------+--------+-------------------------------+--------+ ECA       101     1                                                       +----------+--------+--------+--------+-------------------------------+--------+ +----------+--------+-------+----------------+------------+  PSV cm/sEDV cmsDescribe        Arm Pressure +----------+--------+-------+----------------+------------+ Subclavian185            Multiphasic, WNL             +----------+--------+-------+----------------+------------+ +---------+--------+--+--------+--+---------+ VertebralPSV cm/s74EDV cm/s14Antegrade +---------+--------+--+--------+--+---------+ Left Carotid Findings: +----------+--------+--------+--------+-------------------------------+--------+           PSV cm/sEDV cm/sStenosisDescribe                        Comments +----------+--------+--------+--------+-------------------------------+--------+ CCA Prox  99      13              irregular and heterogenous              +----------+--------+--------+--------+-------------------------------+--------+ CCA Distal100     14              irregular and heterogenous              +----------+--------+--------+--------+-------------------------------+--------+ ICA Prox  79      15      1-39%   smooth, calcific and                                                      homogeneous                             +----------+--------+--------+--------+-------------------------------+--------+ ICA Distal68      19                                                      +----------+--------+--------+--------+-------------------------------+--------+ ECA       111     2                                                       +----------+--------+--------+--------+-------------------------------+--------+ +----------+--------+--------+----------------+------------+ SubclavianPSV cm/sEDV cm/sDescribe        Arm Pressure +----------+--------+--------+----------------+------------+           77              Multiphasic, WNL             +----------+--------+--------+----------------+------------+ +---------+--------+---+--------+--+---------+ VertebralPSV cm/s122EDV cm/s29Antegrade +---------+--------+---+--------+--+---------+  ABI Findings: +---------+------------------+-----+---------+---------------------------------+ Right    Rt Pressure (mmHg)IndexWaveform Comment                           +---------+------------------+-----+---------+---------------------------------+ Brachial                        triphasicNo pressure obtained due to IV                                             placement.                        +---------+------------------+-----+---------+---------------------------------+ PTA  172               1.10 triphasic                                  +---------+------------------+-----+---------+---------------------------------+ DP       158               1.01 triphasic                                  +---------+------------------+-----+---------+---------------------------------+ Great Toe153               0.98 Normal                                     +---------+------------------+-----+---------+---------------------------------+ +---------+------------------+-----+---------+-------+ Left     Lt Pressure (mmHg)IndexWaveform Comment +---------+------------------+-----+---------+-------+ Brachial 156                    triphasic        +---------+------------------+-----+---------+-------+ PTA      187               1.20 triphasic        +---------+------------------+-----+---------+-------+ DP       165               1.06 triphasic        +---------+------------------+-----+---------+-------+ Randie Heinz Toe158               1.01 Normal           +---------+------------------+-----+---------+-------+ +-------+---------------+----------------+ ABI/TBIToday's ABI/TBIPrevious ABI/TBI +-------+---------------+----------------+ Right  1.10           0.98             +-------+---------------+----------------+ Left   1.20           1.01             +-------+---------------+----------------+  Right Doppler Findings: +--------+--------+-----+---------+-----------------------------------------+ Site    PressureIndexDoppler  Comments                                  +--------+--------+-----+---------+-----------------------------------------+ Brachial             triphasicNo pressure obtained due to IV placement. +--------+--------+-----+---------+-----------------------------------------+ Radial               triphasic                                           +--------+--------+-----+---------+-----------------------------------------+ Ulnar                triphasic                                          +--------+--------+-----+---------+-----------------------------------------+  Left Doppler Findings: +--------+--------+-----+---------+--------+ Site    PressureIndexDoppler  Comments +--------+--------+-----+---------+--------+ [id number]          triphasic         +--------+--------+-----+---------+--------+ Radial               triphasic         +--------+--------+-----+---------+--------+  Ulnar                triphasic         +--------+--------+-----+---------+--------+   Summary: Right Carotid: Velocities in the right ICA are consistent with a 1-39% stenosis. Left Carotid: Velocities in the left ICA are consistent with a 1-39% stenosis. Vertebrals:  Bilateral vertebral arteries demonstrate antegrade flow. Subclavians: Normal flow hemodynamics were seen in bilateral subclavian              arteries. Right ABI: Resting right ankle-brachial index is within normal range. The right toe-brachial index is normal. Left ABI: Resting left ankle-brachial index is within normal range. The left toe-brachial index is normal. Right Upper Extremity: Doppler waveforms decrease <50% with right ulnar compression. Left Upper Extremity: Doppler waveforms remain within normal limits with left radial compression. Doppler waveforms decrease <50% with left ulnar compression.  Electronically signed by Heath Lark on 02/14/2023 at 5:40:25 PM.    Final    CARDIAC CATHETERIZATION  Result Date: 02/14/2023   Prox LAD to Mid LAD lesion is 99% stenosed.   1st Diag lesion is 50% stenosed.   Prox Cx to Mid Cx lesion is 25% stenosed.   Mid Cx lesion is 25% stenosed.   Prox RCA to Mid RCA lesion is 25% stenosed.   Non-stenotic Mid LAD lesion was previously treated.   The left ventricular systolic function is normal.   LV end diastolic pressure is normal.   The  left ventricular ejection fraction is 55-65% by visual estimate. 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 The vessel has 2 layers of stent in this area. It has previously been treated with high pressure Selby balloon dilation 4 months ago. Normal LV function Normal LVEDP Plan: I think the LAD is poorly suited for repeat PCI. She has 2 layers of stent in a relatively small vessel. Adding a 3rd layer of stent is not advisable. Repeat POBA at this point will have a predictably short term result. Would recommend CT surgery consult for consideration of LIMA to LAD. Will hold Plavix.   ECHOCARDIOGRAM COMPLETE  Result Date: 02/14/2023    ECHOCARDIOGRAM REPORT   Patient Name:   [name] Date of Exam: 02/14/2023 Medical Rec #:  [id number]     Height:       66.0 in Accession #:    [id number]    Weight:       146.2 lb Date of Birth:  Aug 25, 1951      BSA:          1.750 m Patient Age:    72 years      BP:           176/81 mmHg Patient Gender: F             HR:           77 bpm. Exam Location:  Inpatient Procedure: 2D Echo, Cardiac Doppler and Color Doppler Indications:    Chest Pain  History:        Patient has prior history of Echocardiogram examinations, most                 recent 10/05/2022. CHF, CAD, TIA, Stroke, CKD and PAD,                 Signs/Symptoms:Chest Pain; Risk Factors:Dyslipidemia, Diabetes                 and Hypertension.  Sonographer:    Wallie Char Referring  Phys: [id number] [facility] NICOLE DUKE  Sonographer Comments: Cath'd 10/05/22 IMPRESSIONS  1. Left ventricular ejection fraction, by estimation, is 60 to 65%. The left ventricle has normal function. The left ventricle has no regional wall motion abnormalities. There is mild concentric left ventricular hypertrophy. Left ventricular diastolic parameters are consistent with Grade I diastolic dysfunction (impaired relaxation).  2. Peak RV-RA gradient 30 mmHg. IVC not visualized. Right ventricular systolic function is normal. The right ventricular  size is mildly enlarged.  3. The mitral valve is abnormal. Trivial mitral valve regurgitation. Mild mitral stenosis. The mean mitral valve gradient is 5.0 mmHg. Moderate mitral annular calcification.  4. The aortic valve is tricuspid. There is mild calcification of the aortic valve. Aortic valve regurgitation is not visualized. No aortic stenosis is present. FINDINGS  Left Ventricle: Left ventricular ejection fraction, by estimation, is 60 to 65%. The left ventricle has normal function. The left ventricle has no regional wall motion abnormalities. The left ventricular internal cavity size was normal in size. There is  mild concentric left ventricular hypertrophy. Left ventricular diastolic parameters are consistent with Grade I diastolic dysfunction (impaired relaxation). Right Ventricle: Peak RV-RA gradient 30 mmHg. IVC not visualized. The right ventricular size is mildly enlarged. No increase in right ventricular wall thickness. Right ventricular systolic function is normal. Left Atrium: Left atrial size was normal in size. Right Atrium: Right atrial size was normal in size. Pericardium: There is no evidence of pericardial effusion. Mitral Valve: The mitral valve is abnormal. Moderate mitral annular calcification. Trivial mitral valve regurgitation. Mild mitral valve stenosis. MV peak gradient, 11.2 mmHg. The mean mitral valve gradient is 5.0 mmHg. Tricuspid Valve: The tricuspid valve is normal in structure. Tricuspid valve regurgitation is trivial. Aortic Valve: The aortic valve is tricuspid. There is mild calcification of the aortic valve. Aortic valve regurgitation is not visualized. No aortic stenosis is present. Aortic valve mean gradient measures 3.0 mmHg. Aortic valve peak gradient measures 5.4 mmHg. Aortic valve area, by VTI measures 1.80 cm. Pulmonic Valve: The pulmonic valve was normal in structure. Pulmonic valve regurgitation is not visualized. Aorta: The aortic root is normal in size and structure.  Venous: The inferior vena cava was not well visualized. IAS/Shunts: No atrial level shunt detected by color flow Doppler.  LEFT VENTRICLE PLAX 2D LVIDd:         4.00 cm     Diastology LVIDs:         2.80 cm     LV e' medial:    6.27 cm/s LV PW:         1.20 cm     LV E/e' medial:  19.9 LV IVS:        1.10 cm     LV e' lateral:   6.11 cm/s LVOT diam:     1.60 cm     LV E/e' lateral: 20.5 LV SV:         49 LV SV Index:   28 LVOT Area:     2.01 cm  LV Volumes (MOD) LV vol d, MOD A2C: 45.9 ml LV vol d, MOD A4C: 51.8 ml LV vol s, MOD A2C: 16.9 ml LV vol s, MOD A4C: 18.6 ml LV SV MOD A2C:     29.0 ml LV SV MOD A4C:     51.8 ml LV SV MOD BP:      31.2 ml RIGHT VENTRICLE RV Basal diam:  4.30 cm RV S prime:     12.40 cm/s TAPSE (M-mode): 1.7  cm LEFT ATRIUM             Index        RIGHT ATRIUM           Index LA diam:        4.00 cm 2.29 cm/m   RA Area:     13.90 cm LA Vol (A2C):   35.7 ml 20.40 ml/m  RA Volume:   34.50 ml  19.71 ml/m LA Vol (A4C):   38.0 ml 21.71 ml/m LA Biplane Vol: 37.9 ml 21.65 ml/m  AORTIC VALVE AV Area (Vmax):    1.89 cm AV Area (Vmean):   1.76 cm AV Area (VTI):     1.80 cm AV Vmax:           116.50 cm/s AV Vmean:          87.300 cm/s AV VTI:            0.275 m AV Peak Grad:      5.4 mmHg AV Mean Grad:      3.0 mmHg LVOT Vmax:         109.50 cm/s LVOT Vmean:        76.250 cm/s LVOT VTI:          0.246 m LVOT/AV VTI ratio: 0.89  AORTA Ao Root diam: 3.60 cm Ao Asc diam:  3.30 cm MITRAL VALVE                TRICUSPID VALVE MV Area (PHT): 2.90 cm     TR Peak grad:   30.0 mmHg MV Area VTI:   1.28 cm     TR Vmax:        274.00 cm/s MV Peak grad:  11.2 mmHg MV Mean grad:  5.0 mmHg     SHUNTS MV Vmax:       1.67 m/s     Systemic VTI:  0.25 m MV Vmean:      107.0 cm/s   Systemic Diam: 1.60 cm MV Decel Time: 262 msec MV E velocity: 125.00 cm/s MV A velocity: 188.00 cm/s MV E/A ratio:  0.66 Dalton McleanMD Electronically signed by Wilfred Lacy Signature Date/Time: 02/14/2023/9:10:40 AM    Final      Scheduled Meds:  amLODipine  2.5 mg Oral Daily   aspirin EC  81 mg Oral Daily   atenolol  75 mg Oral BID   furosemide  40 mg Oral Daily   insulin aspart  0-15 Units Subcutaneous TID WC   insulin aspart  0-5 Units Subcutaneous QHS   insulin glargine-yfgn  30 Units Subcutaneous Daily   isosorbide mononitrate  120 mg Oral Daily   lisinopril  10 mg Oral Daily   potassium chloride  10 mEq Oral BID   ranolazine  500 mg Oral BID   rosuvastatin  20 mg Oral Daily   sodium chloride flush  3 mL Intravenous Q12H   Continuous Infusions:  sodium chloride Stopped (02/14/23 0530)   sodium chloride     heparin 850 Units/hr (02/15/23 0004)     LOS: 3 days   Hughie Closs, MD [facility]  02/15/2023, 10:14 AM   *Please note that this is a verbal dictation therefore any spelling or grammatical errors are due to the "[employer]agon [employer] One" system interpretation.  Please page via Amion and do not message via secure chat for urgent patient care matters. Secure chat can be used for non urgent patient care matters.  How to contact the [facility] Attending or  Consulting provider 7A - 7P or covering provider during after hours 7P -7A, for this patient?  Check the care team in [facility] and look for a) attending/consulting [facility] provider listed and b) the [facility] team listed. Page or secure chat 7A-7P. Log into [email or web address] and use [facility] universal password to access. If you do not have the password, please contact the hospital operator. Locate the [facility] provider you are looking for under [facility] and page to a number that you can be directly reached. If you still have difficulty reaching the provider, please page the [facility] (Director on Call) for the Hospitalists listed on amion for assistance.

## 2023-02-15 NOTE — Progress Notes (Signed)
Progress Note  Patient Name: [name] Date of Encounter: 02/15/2023  Primary Cardiologist: Rollene Rotunda, MD   Subjective   Is getting eval for CABG.  Feeling ok.  Is discussing her care with cardiac rehab  Inpatient Medications    Scheduled Meds:  amLODipine  2.5 mg Oral Daily   aspirin EC  81 mg Oral Daily   atenolol  75 mg Oral BID   furosemide  40 mg Oral Daily   insulin aspart  0-15 Units Subcutaneous TID WC   insulin aspart  0-5 Units Subcutaneous QHS   insulin glargine-yfgn  30 Units Subcutaneous Daily   isosorbide mononitrate  120 mg Oral Daily   lisinopril  10 mg Oral Daily   potassium chloride  10 mEq Oral BID   ranolazine  500 mg Oral BID   rosuvastatin  20 mg Oral Daily   sodium chloride flush  3 mL Intravenous Q12H   Continuous Infusions:  sodium chloride Stopped (02/14/23 0530)   sodium chloride     heparin 850 Units/hr (02/15/23 0004)   PRN Meds: sodium chloride, acetaminophen, nitroGLYCERIN, ondansetron (ZOFRAN) IV, rOPINIRole, sodium chloride flush, traZODone   Vital Signs    Vitals:   02/14/23 2009 02/15/23 0037 02/15/23 0407 02/15/23 0730  BP: (!) 158/74 (!) 168/77 (!) 141/76 (!) 153/60  Pulse: 88 82 80 80  Resp: 18 16 14 16   Temp: 98.3 F (36.8 C) 98.3 F (36.8 C) 98.3 F (36.8 C)   TempSrc: Oral Oral Oral   SpO2: 97% 96% 98% 99%  Weight:   66.4 kg   Height:        Intake/Output Summary (Last 24 hours) at 02/15/2023 0901 Last data filed at 02/15/2023 0454 Gross per 24 hour  Intake 1027.17 ml  Output 500 ml  Net 527.17 ml   Filed Weights   02/13/23 0207 02/14/23 0415 02/15/23 0407  Weight: 65.8 kg 66.3 kg 66.4 kg    Telemetry    SR - Personally Reviewed  Physical Exam   Gen: no distress   Neck: No JVD Cardiac: No Rubs or Gallops, soft diastolic murmur, RRR +2 radial pulses Respiratory: Clear to auscultation bilaterally, normal effort, normal  respiratory rate GI: Soft, nontender, non-distended  MS: No  edema;   moves all extremities Integument: Skin feels warm, good distal R arm pulses Neuro:  At time of evaluation, alert and oriented to person/place/time/situation  Psych: Normal affect, patient is discouraged   Labs    Chemistry Recent Labs  Lab 02/13/23 1001 02/14/23 0113 02/15/23 0059  NA 134* 136 135  K 3.4* 2.9* 4.0  CL 92* 96* 100  CO2 25 29 24   GLUCOSE 354* 105* 186*  BUN 19 17 16   CREATININE 1.24* 1.14* 1.05*  CALCIUM 9.0 9.1 9.4  PROT  --  6.7  --   ALBUMIN  --  3.2*  --   AST  --  28  --   ALT  --  14  --   ALKPHOS  --  46  --   BILITOT  --  0.6  --   GFRNONAA 46* 51* 56*  ANIONGAP 17* 11 11     Hematology Recent Labs  Lab 02/13/23 0108 02/14/23 0113 02/15/23 0059  WBC 10.0 7.4 10.9*  RBC 4.04 3.81* 3.94  HGB 12.0 11.2* 11.6*  HCT 34.6* 32.1* 33.7*  MCV 85.6 84.3 85.5  MCH 29.7 29.4 29.4  MCHC 34.7 34.9 34.4  RDW 13.5 13.4 13.4  PLT 188  165 165    Cardiac EnzymesNo results for input(s): "TROPONINI" in the last 168 hours. No results for input(s): "TROPIPOC" in the last 168 hours.   BNPNo results for input(s): "BNP", "PROBNP" in the last 168 hours.   DDimer No results for input(s): "DDIMER" in the last 168 hours.   Radiology    VAS US DOPPLER PRE CABG  Result Date: 02/14/2023 PREOPERATIVE VASCULAR EVALUATION Patient Name:  [name]  Date of Exam:   02/14/2023 Medical Rec #: [id number]      Accession #:    [id number] Date of Birth: 10-13-1950       Patient Gender: F Patient Age:   65 years Exam Location:  [facility] Procedure:      VAS US DOPPLER PRE CABG Referring Phys: Eugenio Hoes --------------------------------------------------------------------------------  Indications:      Pre-CABG. Risk Factors:     Hypertension, hyperlipidemia, prior MI, coronary artery                   disease, prior CVA. Comparison Study: Previous carotid 09/02/2015. Performing Technologist: McKayla Maag RVT, [place]  Examination Guidelines: A complete evaluation includes  B-mode imaging, spectral Doppler, color Doppler, and power Doppler as needed of all accessible portions of each vessel. Bilateral testing is considered an integral part of a complete examination. Limited examinations for reoccurring indications may be performed as noted.  Right Carotid Findings: +----------+--------+--------+--------+-------------------------------+--------+           PSV cm/sEDV cm/sStenosisDescribe                       Comments +----------+--------+--------+--------+-------------------------------+--------+ CCA Prox  67      9                                                       +----------+--------+--------+--------+-------------------------------+--------+ CCA Distal66      8                                                       +----------+--------+--------+--------+-------------------------------+--------+ ICA Prox  71      17      1-39%   smooth, calcific and                                                      homogeneous                             +----------+--------+--------+--------+-------------------------------+--------+ ICA Distal68      19                                                      +----------+--------+--------+--------+-------------------------------+--------+ ECA       101     1                                                       +----------+--------+--------+--------+-------------------------------+--------+ +----------+--------+-------+----------------+------------+  PSV cm/sEDV cmsDescribe        Arm Pressure +----------+--------+-------+----------------+------------+ Subclavian185            Multiphasic, WNL             +----------+--------+-------+----------------+------------+ +---------+--------+--+--------+--+---------+ VertebralPSV cm/s74EDV cm/s14Antegrade +---------+--------+--+--------+--+---------+ Left Carotid Findings:  +----------+--------+--------+--------+-------------------------------+--------+           PSV cm/sEDV cm/sStenosisDescribe                       Comments +----------+--------+--------+--------+-------------------------------+--------+ CCA Prox  99      13              irregular and heterogenous              +----------+--------+--------+--------+-------------------------------+--------+ CCA Distal100     14              irregular and heterogenous              +----------+--------+--------+--------+-------------------------------+--------+ ICA Prox  79      15      1-39%   smooth, calcific and                                                      homogeneous                             +----------+--------+--------+--------+-------------------------------+--------+ ICA Distal68      19                                                      +----------+--------+--------+--------+-------------------------------+--------+ ECA       111     2                                                       +----------+--------+--------+--------+-------------------------------+--------+ +----------+--------+--------+----------------+------------+ SubclavianPSV cm/sEDV cm/sDescribe        Arm Pressure +----------+--------+--------+----------------+------------+           77              Multiphasic, WNL             +----------+--------+--------+----------------+------------+ +---------+--------+---+--------+--+---------+ VertebralPSV cm/s122EDV cm/s29Antegrade +---------+--------+---+--------+--+---------+  ABI Findings: +---------+------------------+-----+---------+---------------------------------+ Right    Rt Pressure (mmHg)IndexWaveform Comment                           +---------+------------------+-----+---------+---------------------------------+ Brachial                        triphasicNo pressure obtained due to IV                                              placement.                        +---------+------------------+-----+---------+---------------------------------+ PTA  172               1.10 triphasic                                  +---------+------------------+-----+---------+---------------------------------+ DP       158               1.01 triphasic                                  +---------+------------------+-----+---------+---------------------------------+ Great Toe153               0.98 Normal                                     +---------+------------------+-----+---------+---------------------------------+ +---------+------------------+-----+---------+-------+ Left     Lt Pressure (mmHg)IndexWaveform Comment +---------+------------------+-----+---------+-------+ Brachial 156                    triphasic        +---------+------------------+-----+---------+-------+ PTA      187               1.20 triphasic        +---------+------------------+-----+---------+-------+ DP       165               1.06 triphasic        +---------+------------------+-----+---------+-------+ Randie Heinz Toe158               1.01 Normal           +---------+------------------+-----+---------+-------+ +-------+---------------+----------------+ ABI/TBIToday's ABI/TBIPrevious ABI/TBI +-------+---------------+----------------+ Right  1.10           0.98             +-------+---------------+----------------+ Left   1.20           1.01             +-------+---------------+----------------+  Right Doppler Findings: +--------+--------+-----+---------+-----------------------------------------+ Site    PressureIndexDoppler  Comments                                  +--------+--------+-----+---------+-----------------------------------------+ Brachial             triphasicNo pressure obtained due to IV placement. +--------+--------+-----+---------+-----------------------------------------+ Radial                triphasic                                          +--------+--------+-----+---------+-----------------------------------------+ Ulnar                triphasic                                          +--------+--------+-----+---------+-----------------------------------------+  Left Doppler Findings: +--------+--------+-----+---------+--------+ Site    PressureIndexDoppler  Comments +--------+--------+-----+---------+--------+ [id number]          triphasic         +--------+--------+-----+---------+--------+ Radial               triphasic         +--------+--------+-----+---------+--------+  Ulnar                triphasic         +--------+--------+-----+---------+--------+   Summary: Right Carotid: Velocities in the right ICA are consistent with a 1-39% stenosis. Left Carotid: Velocities in the left ICA are consistent with a 1-39% stenosis. Vertebrals:  Bilateral vertebral arteries demonstrate antegrade flow. Subclavians: Normal flow hemodynamics were seen in bilateral subclavian              arteries. Right ABI: Resting right ankle-brachial index is within normal range. The right toe-brachial index is normal. Left ABI: Resting left ankle-brachial index is within normal range. The left toe-brachial index is normal. Right Upper Extremity: Doppler waveforms decrease <50% with right ulnar compression. Left Upper Extremity: Doppler waveforms remain within normal limits with left radial compression. Doppler waveforms decrease <50% with left ulnar compression.  Electronically signed by Heath Lark on 02/14/2023 at 5:40:25 PM.    Final    CARDIAC CATHETERIZATION  Result Date: 02/14/2023   Prox LAD to Mid LAD lesion is 99% stenosed.   1st Diag lesion is 50% stenosed.   Prox Cx to Mid Cx lesion is 25% stenosed.   Mid Cx lesion is 25% stenosed.   Prox RCA to Mid RCA lesion is 25% stenosed.   Non-stenotic Mid LAD lesion was previously treated.   The left ventricular systolic  function is normal.   LV end diastolic pressure is normal.   The left ventricular ejection fraction is 55-65% by visual estimate. 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 The vessel has 2 layers of stent in this area. It has previously been treated with high pressure [place] balloon dilation 4 months ago. Normal LV function Normal LVEDP Plan: I think the LAD is poorly suited for repeat PCI. She has 2 layers of stent in a relatively small vessel. Adding a 3rd layer of stent is not advisable. Repeat POBA at this point will have a predictably short term result. Would recommend CT surgery consult for consideration of LIMA to LAD. Will hold Plavix.   ECHOCARDIOGRAM COMPLETE  Result Date: 02/14/2023    ECHOCARDIOGRAM REPORT   Patient Name:   [name] Date of Exam: 02/14/2023 Medical Rec #:  [id number]     Height:       66.0 in Accession #:    [id number]    Weight:       146.2 lb Date of Birth:  04/14/1951      BSA:          1.750 m Patient Age:    72 years      BP:           176/81 mmHg Patient Gender: F             HR:           77 bpm. Exam Location:  Inpatient Procedure: 2D Echo, Cardiac Doppler and Color Doppler Indications:    Chest Pain  History:        Patient has prior history of Echocardiogram examinations, most                 recent 10/05/2022. CHF, CAD, TIA, Stroke, CKD and PAD,                 Signs/Symptoms:Chest Pain; Risk Factors:Dyslipidemia, Diabetes                 and Hypertension.  Sonographer:    Wallie Char Referring  Phys: [id number] [facility] NICOLE DUKE  Sonographer Comments: Cath'd 10/05/22 IMPRESSIONS  1. Left ventricular ejection fraction, by estimation, is 60 to 65%. The left ventricle has normal function. The left ventricle has no regional wall motion abnormalities. There is mild concentric left ventricular hypertrophy. Left ventricular diastolic parameters are consistent with Grade I diastolic dysfunction (impaired relaxation).  2. Peak RV-RA gradient 30 mmHg. IVC not visualized.  Right ventricular systolic function is normal. The right ventricular size is mildly enlarged.  3. The mitral valve is abnormal. Trivial mitral valve regurgitation. Mild mitral stenosis. The mean mitral valve gradient is 5.0 mmHg. Moderate mitral annular calcification.  4. The aortic valve is tricuspid. There is mild calcification of the aortic valve. Aortic valve regurgitation is not visualized. No aortic stenosis is present. FINDINGS  Left Ventricle: Left ventricular ejection fraction, by estimation, is 60 to 65%. The left ventricle has normal function. The left ventricle has no regional wall motion abnormalities. The left ventricular internal cavity size was normal in size. There is  mild concentric left ventricular hypertrophy. Left ventricular diastolic parameters are consistent with Grade I diastolic dysfunction (impaired relaxation). Right Ventricle: Peak RV-RA gradient 30 mmHg. IVC not visualized. The right ventricular size is mildly enlarged. No increase in right ventricular wall thickness. Right ventricular systolic function is normal. Left Atrium: Left atrial size was normal in size. Right Atrium: Right atrial size was normal in size. Pericardium: There is no evidence of pericardial effusion. Mitral Valve: The mitral valve is abnormal. Moderate mitral annular calcification. Trivial mitral valve regurgitation. Mild mitral valve stenosis. MV peak gradient, 11.2 mmHg. The mean mitral valve gradient is 5.0 mmHg. Tricuspid Valve: The tricuspid valve is normal in structure. Tricuspid valve regurgitation is trivial. Aortic Valve: The aortic valve is tricuspid. There is mild calcification of the aortic valve. Aortic valve regurgitation is not visualized. No aortic stenosis is present. Aortic valve mean gradient measures 3.0 mmHg. Aortic valve peak gradient measures 5.4 mmHg. Aortic valve area, by VTI measures 1.80 cm. Pulmonic Valve: The pulmonic valve was normal in structure. Pulmonic valve regurgitation is not  visualized. Aorta: The aortic root is normal in size and structure. Venous: The inferior vena cava was not well visualized. IAS/Shunts: No atrial level shunt detected by color flow Doppler.  LEFT VENTRICLE PLAX 2D LVIDd:         4.00 cm     Diastology LVIDs:         2.80 cm     LV e' medial:    6.27 cm/s LV PW:         1.20 cm     LV E/e' medial:  19.9 LV IVS:        1.10 cm     LV e' lateral:   6.11 cm/s LVOT diam:     1.60 cm     LV E/e' lateral: 20.5 LV SV:         49 LV SV Index:   28 LVOT Area:     2.01 cm  LV Volumes (MOD) LV vol d, MOD A2C: 45.9 ml LV vol d, MOD A4C: 51.8 ml LV vol s, MOD A2C: 16.9 ml LV vol s, MOD A4C: 18.6 ml LV SV MOD A2C:     29.0 ml LV SV MOD A4C:     51.8 ml LV SV MOD BP:      31.2 ml RIGHT VENTRICLE RV Basal diam:  4.30 cm RV S prime:     12.40 cm/s TAPSE (M-mode): 1.7  cm LEFT ATRIUM             Index        RIGHT ATRIUM           Index LA diam:        4.00 cm 2.29 cm/m   RA Area:     13.90 cm LA Vol (A2C):   35.7 ml 20.40 ml/m  RA Volume:   34.50 ml  19.71 ml/m LA Vol (A4C):   38.0 ml 21.71 ml/m LA Biplane Vol: 37.9 ml 21.65 ml/m  AORTIC VALVE AV Area (Vmax):    1.89 cm AV Area (Vmean):   1.76 cm AV Area (VTI):     1.80 cm AV Vmax:           116.50 cm/s AV Vmean:          87.300 cm/s AV VTI:            0.275 m AV Peak Grad:      5.4 mmHg AV Mean Grad:      3.0 mmHg LVOT Vmax:         109.50 cm/s LVOT Vmean:        76.250 cm/s LVOT VTI:          0.246 m LVOT/AV VTI ratio: 0.89  AORTA Ao Root diam: 3.60 cm Ao Asc diam:  3.30 cm MITRAL VALVE                TRICUSPID VALVE MV Area (PHT): 2.90 cm     TR Peak grad:   30.0 mmHg MV Area VTI:   1.28 cm     TR Vmax:        274.00 cm/s MV Peak grad:  11.2 mmHg MV Mean grad:  5.0 mmHg     SHUNTS MV Vmax:       1.67 m/s     Systemic VTI:  0.25 m MV Vmean:      107.0 cm/s   Systemic Diam: 1.60 cm MV Decel Time: 262 msec MV E velocity: 125.00 cm/s MV A velocity: 188.00 cm/s MV E/A ratio:  0.66 Dalton McleanMD Electronically signed by  Wilfred Lacy Signature Date/Time: 02/14/2023/9:10:40 AM    Final      Patient Profile     72 y.o. 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 - on ASA, plavix held for CABG Eval - plan to start 5 mg amlodipine for anti-anginal effects  -in ACEi - on Imdur 120 mg PO daily - on new ranolazine 500 mg PO BID - increasing her atenolol dose to 100 mg - CABG eval pending    Hypertensive urgency Hypertension - continue new CCB     Hyperlipidemia with LDL goal < 55 - Well-controlled on 20 mg crestot     IDDM Hyperglycemia Per primary    For questions or updates, please contact [facility] Please consult [email or web address] for contact info under Cardiology/STEMI.      Riley Lam, MD FASE [facility] Cardiologist [facility]  [address] [place], [address] [place], [place] [address] [phone]  9:01 AM

## 2023-02-15 NOTE — Progress Notes (Signed)
ANTICOAGULATION CONSULT NOTE  Pharmacy Consult for heparin Indication: chest pain/ACS pending CVTS eval  Allergies  Allergen Reactions   Iohexol      Desc: pt had syncopal episode with nausea post IV CM late 1990's,  pt has had prednisone prep with heart caths x 2 without problem  kdean 04/16/07, Onset Date: [id number]    Ticlid [Ticlopidine Hcl] Nausea And Vomiting   Jardiance [Empagliflozin] Other (See Comments)    Recurrent UTIs   Metformin And Related Diarrhea   Codeine Nausea And Vomiting and Palpitations    Patient Measurements: Height: 5\' 6"  (167.6 cm) Weight: 66.4 kg (146 lb 4.8 oz) IBW/kg (Calculated) : 59.3 Heparin Dosing Weight: 65 kg  Vital Signs: Temp: 98.3 F (36.8 C) (05/24 1114) Temp Source: Oral (05/24 1114) BP: 149/63 (05/24 1114) Pulse Rate: 76 (05/24 1114)  Labs: Recent Labs    02/12/23 1606 02/12/23 1940 02/12/23 2121 02/13/23 0108 02/13/23 0952 02/13/23 1001 02/14/23 0113 02/15/23 0059 02/15/23 1108  HGB  --   --   --  12.0  --   --  11.2* 11.6*  --   HCT  --   --   --  34.6*  --   --  32.1* 33.7*  --   PLT  --   --   --  188  --   --  165 165  --   HEPARINUNFRC  --   --   --  0.85*   < >  --  0.33 0.38 0.38  CREATININE  --   --   --   --   --  1.24* 1.14* 1.05*  --   TROPONINIHS 21* 20* 20*  --   --   --   --   --   --    < > = values in this interval not displayed.     Estimated Creatinine Clearance: 44 mL/min (A) (by C-G formula based on SCr of 1.05 mg/dL (H)).   Medical History: Past Medical History:  Diagnosis Date   Anxiety    CAD (coronary artery disease)    DES to circumflex 02/2007, BMS to LAD and PTCA diagonal 03/2007   Carotid artery plaque    Mild   Cataract    Depression    Diverticulitis, colon    Elevated d-dimer 01/08/2014   Essential hypertension, benign    GERD (gastroesophageal reflux disease)    H/O hiatal hernia    HLD (hyperlipidemia)    IDDM (insulin dependent diabetes mellitus)    Migraine    "used to  have them really bad; don't have them anymore" (01/07/2014)   MS (multiple sclerosis) (HCC)    Not confirmed   PAT (paroxysmal atrial tachycardia)    Prolapse of uterus    PVD (peripheral vascular disease) (HCC)    TIA (transient ischemic attack) 1980's    Assessment: 72 year old female presents to ED with chest pain, nausea, and vomiting. Patient does have history of CAD s/p stents. CBC within normal limits.   Patient is now s/p cath with noted LAD disease and plans for CVTS consult for possible CABG.  Heparin was previously at low end of goal on 800 units/hr.  TR band removed at 1330. Heparin resumed 5/23 @1600  on 850 units/hr.  5/24 PM update: HL 0.38- unchanged No signs of bleeding/issues with infusion  Goal of Therapy:  Heparin level 0.3-0.7 units/ml Monitor platelets by anticoagulation protocol: Yes   Plan:  Continue heparin infusion at 850 units/hr Check heparin level  daily while on heparin Continue to monitor H&H and platelets  Thank you for allowing pharmacy to be a part of this patient's care.  Greta Doom BS, PharmD, BCPS Clinical Pharmacist 02/15/2023 1:33 PM  Contact: [phone] after 3 PM  "Be curious, not judgmental..." -Debbora Dus

## 2023-02-15 NOTE — Progress Notes (Signed)
ANTICOAGULATION CONSULT NOTE - Follow Up Consult  Pharmacy Consult for heparin Indication:  CAD awaiting CVTS consult  Labs: Recent Labs    02/12/23 1442 02/12/23 1606 02/12/23 1940 02/12/23 2121 02/13/23 0108 02/13/23 0952 02/13/23 1001 02/14/23 0113 02/15/23 0059  HGB  --   --   --   --  12.0  --   --  11.2* 11.6*  HCT  --   --   --   --  34.6*  --   --  32.1* 33.7*  PLT  --   --   --   --  188  --   --  165 165  HEPARINUNFRC   < >  --   --   --  0.85* 0.49  --  0.33 0.38  CREATININE  --   --   --   --   --   --  1.24* 1.14* 1.05*  TROPONINIHS  --  21* 20* 20*  --   --   --   --   --    < > = values in this interval not displayed.    Assessment/Plan:  72yo female therapeutic on heparin after resumed post-cath. Will continue infusion at current rate of 850 units/hr and confirm stable with additional level.  Vernard Gambles, PharmD, BCPS 02/15/2023 3:05 AM

## 2023-02-15 NOTE — TOC Progression Note (Signed)
Transition of Care [facility]) - Progression Note    Patient Details  Name: [name] MRN: [id number] Date of Birth: 1951-06-27  Transition of Care [facility]) CM/SW Contact  Leone Haven, RN Phone Number: 02/15/2023, 4:36 PM  Clinical Narrative:    MD consulted CT surgery for possible CABG consideration.         Expected Discharge Plan and Services                                               Social Determinants of Health (SDOH) Interventions SDOH Screenings   Food Insecurity: No Food Insecurity (02/14/2023)  Housing: Low Risk  (02/14/2023)  Transportation Needs: No Transportation Needs (02/14/2023)  Utilities: Not At Risk (02/14/2023)  Alcohol Screen: Low Risk  (01/24/2023)  Depression (PHQ2-9): Medium Risk (02/04/2023)  Financial Resource Strain: Low Risk  (01/24/2023)  Physical Activity: Insufficiently Active (01/24/2023)  Social Connections: Moderately Integrated (01/24/2023)  Stress: No Stress Concern Present (01/24/2023)  Tobacco Use: Low Risk  (02/15/2023)    Readmission Risk Interventions    10/12/2020   10:49 AM  Readmission Risk Prevention Plan  Transportation Screening Complete  Home Care Screening Complete  Medication Review (RN CM) Complete

## 2023-02-16 DIAGNOSIS — I200 Unstable angina: Secondary | ICD-10-CM | POA: Diagnosis not present

## 2023-02-16 LAB — CBC
HCT: 32.2 % — ABNORMAL LOW (ref 36.0–46.0)
Hemoglobin: 10.8 g/dL — ABNORMAL LOW (ref 12.0–15.0)
MCH: 29.1 pg (ref 26.0–34.0)
MCHC: 33.5 g/dL (ref 30.0–36.0)
MCV: 86.8 fL (ref 80.0–100.0)
Platelets: 131 10*3/uL — ABNORMAL LOW (ref 150–400)
RBC: 3.71 MIL/uL — ABNORMAL LOW (ref 3.87–5.11)
RDW: 13.5 % (ref 11.5–15.5)
WBC: 8.4 10*3/uL (ref 4.0–10.5)
nRBC: 0 % (ref 0.0–0.2)

## 2023-02-16 LAB — GLUCOSE, CAPILLARY
Glucose-Capillary: 101 mg/dL — ABNORMAL HIGH (ref 70–99)
Glucose-Capillary: 215 mg/dL — ABNORMAL HIGH (ref 70–99)
Glucose-Capillary: 185 mg/dL — ABNORMAL HIGH (ref 70–99)
Glucose-Capillary: 157 mg/dL — ABNORMAL HIGH (ref 70–99)

## 2023-02-16 LAB — HEPARIN LEVEL (UNFRACTIONATED)
Heparin Unfractionated: 0.71 IU/mL — ABNORMAL HIGH (ref 0.30–0.70)
Heparin Unfractionated: 0.48 IU/mL (ref 0.30–0.70)

## 2023-02-16 NOTE — Progress Notes (Signed)
ANTICOAGULATION CONSULT NOTE - Follow Up Consult  Pharmacy Consult for heparin Indication:  CAD awaiting likely CABG  Labs: Recent Labs    02/13/23 0952 02/13/23 1001 02/14/23 0113 02/15/23 0059 02/15/23 1108 02/16/23 0057  HGB   < >  --  11.2* 11.6*  --  10.8*  HCT  --   --  32.1* 33.7*  --  32.2*  PLT  --   --  165 165  --  131*  HEPARINUNFRC  --   --  0.33 0.38 0.38 0.71*  CREATININE  --  1.24* 1.14* 1.05*  --   --    < > = values in this interval not displayed.    Assessment: 72yo female slightly supratherapeutic on heparin after several levels at goal; no infusion issues or signs of bleeding per RN.  Goal of Therapy:  Heparin level 0.3-0.7 units/ml   Plan:  Decrease heparin infusion slightly to 800 units/hr. Check level in 8 hours.   Vernard Gambles, PharmD, BCPS 02/16/2023 2:01 AM

## 2023-02-16 NOTE — Progress Notes (Signed)
CARDIAC REHAB PHASE I   PRE:  Rate/Rhythm: NSR  BP:  Sitting: 131/74      SaO2: n/a  MODE:  Ambulation: 410 ft   POST:  Rate/Rhythm: NSR  BP:  Sitting: 141/69      SaO2: n/a  Pt tolerated exercise well and amb 410 ft independently. Pt denies CP, SOB, or dizziness throughout walk. Unable to play pre-op video d/t video system being down. Will continue to follow.  [id number] Essie Hart, RN, BSN 02/16/2023 1:09 PM

## 2023-02-16 NOTE — Progress Notes (Signed)
PROGRESS NOTE    [name]  ZOX:[id number] DOB: [birth year] DOA: 02/12/2023 PCP: Gabriel Earing, FNP   Brief Narrative:  [name], a 72 y/o with medical h/o DM-on insulin,HLD, GERD and CAD. Her most recent cardiac history indicated PCI July 2023 for restenosis LAD proximal to previous stent; PCY Jan 2024 for unstable angina with PCI for instent stenosis of LAD. Last Cardiology OV note 11/21/22 she was stable. On the day of admission she experienced N/V and chest pain similar to previous episodes of angina. EMS activated. In route to [facility] she was given ASA and sl NTG.     ED Course: T 98.1  171/84  RR 18. Patient uncomfortable per EDP but hemodynamically stable. Lab K 3.0  CBC nl, Troponin 21, 21. EKG w/o STEMI or acute changes. Last ECHO 10/05/22- EF 60-65%, mild MoV regurg. TRH called to admit for continued management of unstable angina/ACS  Assessment & Plan:   Principal Problem:   Unstable angina (HCC) Active Problems:   Coronary artery disease involving native coronary artery of native heart with unstable angina pectoris (HCC)   Hypertension associated with diabetes (HCC)   Hyperlipidemia associated with type 2 diabetes mellitus (HCC)   Type 2 diabetes mellitus with hyperglycemia (HCC)   Gastroesophageal reflux disease without esophagitis   Generalized anxiety disorder  Unstable angina in a patient with history of coronary artery disease involving native coronary artery of native heart:  Patient with h/o obstructive CAD s/p Stenting x 4, last Jan 2024. She reports that her symptoms are just like previous episodes of chest pressure which was due to recurrent LAD stenosis or instent stenosis.  Started on nitroglycerin drip due to persistent chest pain.  Still complains of chest pressure 7 out of 10.  Underwent cardiac cath 02/14/2023 and was found to have severe single-vessel LAD CAD and since she has had multiple previous percutaneous interventions and further interventions  were not recommended by cardiology.  Patient seen by CT surgery, plan for CABG on 02/19/2023.  Patient remains in the hospital till then and continues to be on heparin.   Hypertension: Blood pressure controlled.  Continue current medications.   Generalized anxiety disorder  Continue trazadon   Gastroesophageal reflux disease without esophagitis Continue PPI.   Type 2 diabetes mellitus with hyperglycemia (HCC) Patient on Novolog at meals, [place] daily - she adjusts her dose and Ozempic weekly. Last A1C 02/13/23 7.8%.  It appears that she takes 60 units of Tresiba at home.  She was started on 30 units of Semglee, slightly hyperglycemic, increase to 40 units.  Continue SSI.   Hyperlipidemia associated with type 2 diabetes mellitus (HCC) LDL at goal but HDL low.  Continue Crestor.  DVT prophylaxis: Heparin drip   Code Status: Full Code  Family Communication:  None present at bedside.  Plan of care discussed with patient in length and he/she verbalized understanding and agreed with it.  Status is: Inpatient Remains inpatient appropriate because: Patient is to stay in the hospital on heparin drip until CABG is done which is scheduled on 02/19/2023.  Estimated body mass index is 23.42 kg/m as calculated from the following:   Height as of this encounter: 5\' 6"  (1.676 m).   Weight as of this encounter: 65.8 kg.    Nutritional Assessment: Body mass index is 23.42 kg/m.Marland Kitchen Seen by dietician.  I agree with the assessment and plan as outlined below: Nutrition Status:        . Skin Assessment: I have examined  the patient's skin and I agree with the wound assessment as performed by the wound care RN as outlined below:    Consultants:  Cardiology  Procedures:  As above  Antimicrobials:  Anti-infectives (From admission, onward)    Start     Dose/Rate Route Frequency Ordered Stop   02/19/23 0400  vancomycin (VANCOREADY) IVPB 1250 mg/250 mL        1,250 mg 166.7 mL/hr over 90  Minutes Intravenous To Surgery 02/15/23 1320 02/20/23 0400   02/19/23 0400  ceFAZolin (ANCEF) IVPB 2g/100 mL premix        2 g 200 mL/hr over 30 Minutes Intravenous To Surgery 02/15/23 1320 02/20/23 0400   02/19/23 0400  ceFAZolin (ANCEF) IVPB 2g/100 mL premix        2 g 200 mL/hr over 30 Minutes Intravenous To Surgery 02/15/23 1320 02/20/23 0400         Subjective: Seen and examined.  Feels slightly better but is still complains of chest pressure and it is 5 out of 10 today.  No other complaint.  Objective: Vitals:   02/15/23 1927 02/16/23 0112 02/16/23 0423 02/16/23 0847  BP: (!) 142/62 133/60 (!) 149/63 (!) 133/55  Pulse: 71 69 70 72  Resp: 19 14 19 20   Temp: 98.2 F (36.8 C) 98.2 F (36.8 C) 98.2 F (36.8 C) 98.1 F (36.7 C)  TempSrc: Oral Oral Oral Oral  SpO2: 95% 98% 100% 99%  Weight:   65.8 kg   Height:        Intake/Output Summary (Last 24 hours) at 02/16/2023 1020 Last data filed at 02/16/2023 0232 Gross per 24 hour  Intake 344.96 ml  Output 900 ml  Net -555.04 ml    Filed Weights   02/14/23 0415 02/15/23 0407 02/16/23 0423  Weight: 66.3 kg 66.4 kg 65.8 kg    Examination:  General exam: Appears calm and comfortable  Respiratory system: Clear to auscultation. Respiratory effort normal. Cardiovascular system: S1 & S2 heard, RRR. No JVD, murmurs, rubs, gallops or clicks. No pedal edema. Gastrointestinal system: Abdomen is nondistended, soft and nontender. No organomegaly or masses felt. Normal bowel sounds heard. Central nervous system: Alert and oriented. No focal neurological deficits. Extremities: Symmetric 5 x 5 power. Skin: No rashes, lesions or ulcers.  Psychiatry: Judgement and insight appear normal. Mood & affect appropriate.   Data Reviewed: I have personally reviewed following labs and imaging studies  CBC: Recent Labs  Lab 02/12/23 1442 02/13/23 0108 02/14/23 0113 02/15/23 0059 02/16/23 0057  WBC 8.7 10.0 7.4 10.9* 8.4  HGB 12.8 12.0  11.2* 11.6* 10.8*  HCT 36.8 34.6* 32.1* 33.7* 32.2*  MCV 85.4 85.6 84.3 85.5 86.8  PLT 184 188 165 165 131*    Basic Metabolic Panel: Recent Labs  Lab 02/12/23 1442 02/13/23 1001 02/14/23 0113 02/15/23 0059  NA 134* 134* 136 135  K 3.0* 3.4* 2.9* 4.0  CL 95* 92* 96* 100  CO2 27 25 29 24   GLUCOSE 232* 354* 105* 186*  BUN 13 19 17 16   CREATININE 1.11* 1.24* 1.14* 1.05*  CALCIUM 9.1 9.0 9.1 9.4    GFR: Estimated Creatinine Clearance: 44 mL/min (A) (by C-G formula based on SCr of 1.05 mg/dL (H)). Liver Function Tests: Recent Labs  Lab 02/14/23 0113  AST 28  ALT 14  ALKPHOS 46  BILITOT 0.6  PROT 6.7  ALBUMIN 3.2*    No results for input(s): "LIPASE", "AMYLASE" in the last 168 hours. No results for input(s): "AMMONIA" in the  last 168 hours. Coagulation Profile: No results for input(s): "INR", "PROTIME" in the last 168 hours. Cardiac Enzymes: No results for input(s): "CKTOTAL", "CKMB", "CKMBINDEX", "TROPONINI" in the last 168 hours. BNP (last 3 results) No results for input(s): "PROBNP" in the last 8760 hours. HbA1C: No results for input(s): "HGBA1C" in the last 72 hours.  CBG: Recent Labs  Lab 02/15/23 0610 02/15/23 1112 02/15/23 1616 02/15/23 2123 02/16/23 0610  GLUCAP 209* 334* 184* 140* 101*    Lipid Profile: No results for input(s): "CHOL", "HDL", "LDLCALC", "TRIG", "CHOLHDL", "LDLDIRECT" in the last 72 hours.  Thyroid Function Tests: No results for input(s): "TSH", "T4TOTAL", "FREET4", "T3FREE", "THYROIDAB" in the last 72 hours. Anemia Panel: No results for input(s): "VITAMINB12", "FOLATE", "FERRITIN", "TIBC", "IRON", "RETICCTPCT" in the last 72 hours. Sepsis Labs: No results for input(s): "PROCALCITON", "LATICACIDVEN" in the last 168 hours.  Recent Results (from the past 240 hour(s))  Surgical pcr screen     Status: None   Collection Time: 02/15/23  8:02 AM   Specimen: Nasal Mucosa; Nasal Swab  Result Value Ref Range Status   MRSA, PCR  NEGATIVE NEGATIVE Final   Staphylococcus aureus NEGATIVE NEGATIVE Final    Comment: (NOTE) The Xpert SA Assay (FDA approved for NASAL specimens in patients 60 years of age and older), is one component of a comprehensive surveillance program. It is not intended to diagnose infection nor to guide or monitor treatment. Performed at [facility], [address] [place], [place] [address]       Radiology Studies: VAS US DOPPLER PRE CABG  Result Date: 02/14/2023 PREOPERATIVE VASCULAR EVALUATION Patient Name:  SHAIANNA FELMLEE  Date of Exam:   02/14/2023 Medical Rec #: [id number]      Accession #:    [id number] Date of Birth: 05/19/1951       Patient Gender: F Patient Age:   72 years Exam Location:  [facility] Procedure:      VAS US DOPPLER PRE CABG Referring Phys: Eugenio Hoes --------------------------------------------------------------------------------  Indications:      Pre-CABG. Risk Factors:     Hypertension, hyperlipidemia, prior MI, coronary artery                   disease, prior CVA. Comparison Study: Previous carotid 09/02/2015. Performing Technologist: McKayla Maag RVT, [place]  Examination Guidelines: A complete evaluation includes B-mode imaging, spectral Doppler, color Doppler, and power Doppler as needed of all accessible portions of each vessel. Bilateral testing is considered an integral part of a complete examination. Limited examinations for reoccurring indications may be performed as noted.  Right Carotid Findings: +----------+--------+--------+--------+-------------------------------+--------+           PSV cm/sEDV cm/sStenosisDescribe                       Comments +----------+--------+--------+--------+-------------------------------+--------+ CCA Prox  67      9                                                       +----------+--------+--------+--------+-------------------------------+--------+ CCA Distal66      8                                                        +----------+--------+--------+--------+-------------------------------+--------+  ICA Prox  71      17      1-39%   smooth, calcific and                                                      homogeneous                             +----------+--------+--------+--------+-------------------------------+--------+ ICA Distal68      19                                                      +----------+--------+--------+--------+-------------------------------+--------+ ECA       101     1                                                       +----------+--------+--------+--------+-------------------------------+--------+ +----------+--------+-------+----------------+------------+           PSV cm/sEDV cmsDescribe        Arm Pressure +----------+--------+-------+----------------+------------+ Subclavian185            Multiphasic, WNL             +----------+--------+-------+----------------+------------+ +---------+--------+--+--------+--+---------+ VertebralPSV cm/s74EDV cm/s14Antegrade +---------+--------+--+--------+--+---------+ Left Carotid Findings: +----------+--------+--------+--------+-------------------------------+--------+           PSV cm/sEDV cm/sStenosisDescribe                       Comments +----------+--------+--------+--------+-------------------------------+--------+ CCA Prox  99      13              irregular and heterogenous              +----------+--------+--------+--------+-------------------------------+--------+ CCA Distal100     14              irregular and heterogenous              +----------+--------+--------+--------+-------------------------------+--------+ ICA Prox  79      15      1-39%   smooth, calcific and                                                      homogeneous                             +----------+--------+--------+--------+-------------------------------+--------+ ICA Distal68       19                                                      +----------+--------+--------+--------+-------------------------------+--------+ ECA       111     2                                                       +----------+--------+--------+--------+-------------------------------+--------+ +----------+--------+--------+----------------+------------+  SubclavianPSV cm/sEDV cm/sDescribe        Arm Pressure +----------+--------+--------+----------------+------------+           77              Multiphasic, WNL             +----------+--------+--------+----------------+------------+ +---------+--------+---+--------+--+---------+ VertebralPSV cm/s122EDV cm/s29Antegrade +---------+--------+---+--------+--+---------+  ABI Findings: +---------+------------------+-----+---------+---------------------------------+ Right    Rt Pressure (mmHg)IndexWaveform Comment                           +---------+------------------+-----+---------+---------------------------------+ Brachial                        triphasicNo pressure obtained due to IV                                             placement.                        +---------+------------------+-----+---------+---------------------------------+ PTA      172               1.10 triphasic                                  +---------+------------------+-----+---------+---------------------------------+ DP       158               1.01 triphasic                                  +---------+------------------+-----+---------+---------------------------------+ Great Toe153               0.98 Normal                                     +---------+------------------+-----+---------+---------------------------------+ +---------+------------------+-----+---------+-------+ Left     Lt Pressure (mmHg)IndexWaveform Comment +---------+------------------+-----+---------+-------+ Brachial 156                    triphasic         +---------+------------------+-----+---------+-------+ PTA      187               1.20 triphasic        +---------+------------------+-----+---------+-------+ DP       165               1.06 triphasic        +---------+------------------+-----+---------+-------+ Randie Heinz Toe158               1.01 Normal           +---------+------------------+-----+---------+-------+ +-------+---------------+----------------+ ABI/TBIToday's ABI/TBIPrevious ABI/TBI +-------+---------------+----------------+ Right  1.10           0.98             +-------+---------------+----------------+ Left   1.20           1.01             +-------+---------------+----------------+  Right Doppler Findings: +--------+--------+-----+---------+-----------------------------------------+ Site    PressureIndexDoppler  Comments                                  +--------+--------+-----+---------+-----------------------------------------+  Brachial             triphasicNo pressure obtained due to IV placement. +--------+--------+-----+---------+-----------------------------------------+ Radial               triphasic                                          +--------+--------+-----+---------+-----------------------------------------+ Ulnar                triphasic                                          +--------+--------+-----+---------+-----------------------------------------+  Left Doppler Findings: +--------+--------+-----+---------+--------+ Site    PressureIndexDoppler  Comments +--------+--------+-----+---------+--------+ [id number]          triphasic         +--------+--------+-----+---------+--------+ Radial               triphasic         +--------+--------+-----+---------+--------+ Ulnar                triphasic         +--------+--------+-----+---------+--------+   Summary: Right Carotid: Velocities in the right ICA are consistent with a 1-39% stenosis. Left  Carotid: Velocities in the left ICA are consistent with a 1-39% stenosis. Vertebrals:  Bilateral vertebral arteries demonstrate antegrade flow. Subclavians: Normal flow hemodynamics were seen in bilateral subclavian              arteries. Right ABI: Resting right ankle-brachial index is within normal range. The right toe-brachial index is normal. Left ABI: Resting left ankle-brachial index is within normal range. The left toe-brachial index is normal. Right Upper Extremity: Doppler waveforms decrease <50% with right ulnar compression. Left Upper Extremity: Doppler waveforms remain within normal limits with left radial compression. Doppler waveforms decrease <50% with left ulnar compression.  Electronically signed by Heath Lark on 02/14/2023 at 5:40:25 PM.    Final     Scheduled Meds:  amLODipine  2.5 mg Oral Daily   aspirin EC  81 mg Oral Daily   atenolol  100 mg Oral BID   docusate sodium  100 mg Oral BID   [START ON 02/19/2023] epinephrine  0-10 mcg/min Intravenous To OR   furosemide  40 mg Oral Daily   [START ON 02/19/2023] heparin sodium (porcine) 2,500 Units, papaverine 30 mg in electrolyte-A (PLASMALYTE-A PH 7.4) 500 mL irrigation   Irrigation To OR   insulin aspart  0-15 Units Subcutaneous TID WC   insulin aspart  0-5 Units Subcutaneous QHS   insulin glargine-yfgn  40 Units Subcutaneous Daily   [START ON 02/19/2023] insulin   Intravenous To OR   isosorbide mononitrate  120 mg Oral Daily   [START ON 02/19/2023] [place] Blood Cardioplegia vial (lidocaine/magnesium/mannitol 0.26g-4g-6.4g)   Intracoronary To OR   lisinopril  10 mg Oral Daily   [START ON 02/19/2023] phenylephrine  30-200 mcg/min Intravenous To OR   polyethylene glycol  17 g Oral Daily   potassium chloride  10 mEq Oral BID   [START ON 02/19/2023] potassium chloride  80 mEq Other To OR   ranolazine  500 mg Oral BID   rosuvastatin  20 mg Oral Daily   sodium chloride flush  3 mL Intravenous Q12H   [START ON 02/19/2023] tranexamic  acid  15 mg/kg Intravenous  To OR   [START ON 02/19/2023] tranexamic acid  2 mg/kg Intracatheter To OR   Continuous Infusions:  sodium chloride Stopped (02/14/23 0530)   sodium chloride     [START ON 02/19/2023]  ceFAZolin (ANCEF) IV     [START ON 02/19/2023]  ceFAZolin (ANCEF) IV     [START ON 02/19/2023] dexmedetomidine     [START ON 02/19/2023] heparin 30,000 units/NS 1000 mL solution for CELLSAVER     heparin 800 Units/hr (02/16/23 0232)   [START ON 02/19/2023] milrinone     [START ON 02/19/2023] nitroGLYCERIN     [START ON 02/19/2023] norepinephrine     [START ON 02/19/2023] tranexamic acid (CYKLOKAPRON) 2,500 mg in sodium chloride 0.9 % 250 mL (10 mg/mL) infusion     [START ON 02/19/2023] vancomycin       LOS: 4 days   Hughie Closs, MD [facility]  02/16/2023, 10:20 AM   *Please note that this is a verbal dictation therefore any spelling or grammatical errors are due to the "Dragon Medical One" system interpretation.  Please page via Amion and do not message via secure chat for urgent patient care matters. Secure chat can be used for non urgent patient care matters.  How to contact the [facility] Attending or Consulting provider 7A - 7P or covering provider during after hours 7P -7A, for this patient?  Check the care team in [facility] and look for a) attending/consulting TR[facility] provider listed and b) the [facility] team listed. Page or secure chat 7A-7P. Log into [email or web address] and use [place] universal password to access. If you do not have the password, please contact the hospital operator. Locate the [facility] provider you are looking for under [facility] and page to a number that you can be directly reached. If you still have difficulty reaching the provider, please page the [facility] (Director on Call) for the Hospitalists listed on amion for assistance.

## 2023-02-16 NOTE — Progress Notes (Signed)
ANTICOAGULATION CONSULT NOTE  Pharmacy Consult for heparin Indication: chest pain/ACS  Allergies  Allergen Reactions   Iohexol      Desc: pt had syncopal episode with nausea post IV CM late 1990's,  pt has had prednisone prep with heart caths x 2 without problem  kdean 04/16/07, Onset Date: [id number]    Ticlid [Ticlopidine Hcl] Nausea And Vomiting   Jardiance [Empagliflozin] Other (See Comments)    Recurrent UTIs   Metformin And Related Diarrhea   Codeine Nausea And Vomiting and Palpitations    Patient Measurements: Height: 5\' 6"  (167.6 cm) Weight: 65.8 kg (145 lb 1.6 oz) IBW/kg (Calculated) : 59.3 Heparin Dosing Weight: 65 kg  Vital Signs: Temp: 98.1 F (36.7 C) (05/25 0847) Temp Source: Oral (05/25 0847) BP: 133/55 (05/25 0847) Pulse Rate: 72 (05/25 0847)  Labs: Recent Labs    02/14/23 0113 02/15/23 0059 02/15/23 1108 02/16/23 0057 02/16/23 0952  HGB 11.2* 11.6*  --  10.8*  --   HCT 32.1* 33.7*  --  32.2*  --   PLT 165 165  --  131*  --   HEPARINUNFRC 0.33 0.38 0.38 0.71* 0.48  CREATININE 1.14* 1.05*  --   --   --     Estimated Creatinine Clearance: 44 mL/min (A) (by C-G formula based on SCr of 1.05 mg/dL (H)).   Medical History: Past Medical History:  Diagnosis Date   Anxiety    CAD (coronary artery disease)    DES to circumflex 02/2007, BMS to LAD and PTCA diagonal 03/2007   Carotid artery plaque    Mild   Cataract    Depression    Diverticulitis, colon    Elevated d-dimer 01/08/2014   Essential hypertension, benign    GERD (gastroesophageal reflux disease)    H/O hiatal hernia    HLD (hyperlipidemia)    IDDM (insulin dependent diabetes mellitus)    Migraine    "used to have them really bad; don't have them anymore" (01/07/2014)   MS (multiple sclerosis) (HCC)    Not confirmed   PAT (paroxysmal atrial tachycardia)    Prolapse of uterus    PVD (peripheral vascular disease) (HCC)    TIA (transient ischemic attack) 1980's    Assessment: 72 year  old female presents to ED with chest pain, nausea, and vomiting. Patient does have history of CAD s/p stents. Pharmacy consulted to dose heparin for ACS/chest pain.   Patient is now s/p cath with noted LAD disease with plan for CABG 5/28. Heparin resumed 5/23 after TR band removal. Today, heparin level 0.48 on drip rate 800 units/hr therapeutic. Heparin level continues to be within range. Hgb 10.8 and plt 131. No s/sx bleeding noted in chart.    Goal of Therapy:  Heparin level 0.3-0.7 units/ml Monitor platelets by anticoagulation protocol: Yes   Plan:  Continue heparin infusion at 800 units/hr Check heparin level and CBC daily while on heparin Continue to monitor H&H and platelets  Thank you for allowing pharmacy to be a part of this patient's care.  Jerry Caras, PharmD PGY1 Pharmacy Resident   02/16/2023 11:08 AM

## 2023-02-16 NOTE — Progress Notes (Signed)
Progress Note  Patient Name: [name] Date of Encounter: 02/16/2023  Primary Cardiologist: Rollene Rotunda, MD   Subjective   Surgery planned for 02/19/23.  CP feels better since increased BB.  Inpatient Medications    Scheduled Meds:  amLODipine  2.5 mg Oral Daily   aspirin EC  81 mg Oral Daily   atenolol  100 mg Oral BID   docusate sodium  100 mg Oral BID   [START ON 02/19/2023] epinephrine  0-10 mcg/min Intravenous To OR   furosemide  40 mg Oral Daily   [START ON 02/19/2023] heparin sodium (porcine) 2,500 Units, papaverine 30 mg in electrolyte-A (PLASMALYTE-A PH 7.4) 500 mL irrigation   Irrigation To OR   insulin aspart  0-15 Units Subcutaneous TID WC   insulin aspart  0-5 Units Subcutaneous QHS   insulin glargine-yfgn  40 Units Subcutaneous Daily   [START ON 02/19/2023] insulin   Intravenous To OR   isosorbide mononitrate  120 mg Oral Daily   [START ON 02/19/2023] [place] Blood Cardioplegia vial (lidocaine/magnesium/mannitol 0.26g-4g-6.4g)   Intracoronary To OR   lisinopril  10 mg Oral Daily   [START ON 02/19/2023] phenylephrine  30-200 mcg/min Intravenous To OR   polyethylene glycol  17 g Oral Daily   potassium chloride  10 mEq Oral BID   [START ON 02/19/2023] potassium chloride  80 mEq Other To OR   ranolazine  500 mg Oral BID   rosuvastatin  20 mg Oral Daily   sodium chloride flush  3 mL Intravenous Q12H   [START ON 02/19/2023] tranexamic acid  15 mg/kg Intravenous To OR   [START ON 02/19/2023] tranexamic acid  2 mg/kg Intracatheter To OR   Continuous Infusions:  sodium chloride Stopped (02/14/23 0530)   sodium chloride     [START ON 02/19/2023]  ceFAZolin (ANCEF) IV     [START ON 02/19/2023]  ceFAZolin (ANCEF) IV     [START ON 02/19/2023] dexmedetomidine     [START ON 02/19/2023] heparin 30,000 units/NS 1000 mL solution for CELLSAVER     heparin 800 Units/hr (02/16/23 0232)   [START ON 02/19/2023] milrinone     [START ON 02/19/2023] nitroGLYCERIN     [START ON  02/19/2023] norepinephrine     [START ON 02/19/2023] tranexamic acid (CYKLOKAPRON) 2,500 mg in sodium chloride 0.9 % 250 mL (10 mg/mL) infusion     [START ON 02/19/2023] vancomycin     PRN Meds: sodium chloride, acetaminophen, nitroGLYCERIN, ondansetron (ZOFRAN) IV, rOPINIRole, sodium chloride flush, traZODone   Vital Signs    Vitals:   02/15/23 1927 02/16/23 0112 02/16/23 0423 02/16/23 0847  BP: (!) 142/62 133/60 (!) 149/63 (!) 133/55  Pulse: 71 69 70 72  Resp: 19 14 19 20   Temp: 98.2 F (36.8 C) 98.2 F (36.8 C) 98.2 F (36.8 C) 98.1 F (36.7 C)  TempSrc: Oral Oral Oral Oral  SpO2: 95% 98% 100% 99%  Weight:   65.8 kg   Height:        Intake/Output Summary (Last 24 hours) at 02/16/2023 0933 Last data filed at 02/16/2023 0232 Gross per 24 hour  Intake 344.96 ml  Output 900 ml  Net -555.04 ml   Filed Weights   02/14/23 0415 02/15/23 0407 02/16/23 0423  Weight: 66.3 kg 66.4 kg 65.8 kg    Telemetry    SR - Personally Reviewed  Physical Exam   Gen: no distress   Neck: No JVD Cardiac: No Rubs or Gallops, soft diastolic murmur, RRR +2 radial pulses  Respiratory: Clear to auscultation bilaterally, normal effort, normal  respiratory rate GI: Soft, nontender, non-distended  MS: No  edema;  moves all extremities Integument: Skin feels warm, good distal R arm pulses Neuro:  At time of evaluation, alert and oriented to person/place/time/situation  Psych: Normal affect, patient is discouraged   Labs    Chemistry Recent Labs  Lab 02/13/23 1001 02/14/23 0113 02/15/23 0059  NA 134* 136 135  K 3.4* 2.9* 4.0  CL 92* 96* 100  CO2 25 29 24   GLUCOSE 354* 105* 186*  BUN 19 17 16   CREATININE 1.24* 1.14* 1.05*  CALCIUM 9.0 9.1 9.4  PROT  --  6.7  --   ALBUMIN  --  3.2*  --   AST  --  28  --   ALT  --  14  --   ALKPHOS  --  46  --   BILITOT  --  0.6  --   GFRNONAA 46* 51* 56*  ANIONGAP 17* 11 11     Hematology Recent Labs  Lab 02/14/23 0113 02/15/23 0059  02/16/23 0057  WBC 7.4 10.9* 8.4  RBC 3.81* 3.94 3.71*  HGB 11.2* 11.6* 10.8*  HCT 32.1* 33.7* 32.2*  MCV 84.3 85.5 86.8  MCH 29.4 29.4 29.1  MCHC 34.9 34.4 33.5  RDW 13.4 13.4 13.5  PLT 165 165 131*    Cardiac EnzymesNo results for input(s): "TROPONINI" in the last 168 hours. No results for input(s): "TROPIPOC" in the last 168 hours.   BNPNo results for input(s): "BNP", "PROBNP" in the last 168 hours.   DDimer No results for input(s): "DDIMER" in the last 168 hours.   Radiology    VAS US DOPPLER PRE CABG  Result Date: 02/14/2023 PREOPERATIVE VASCULAR EVALUATION Patient Name:  [name]  Date of Exam:   02/14/2023 Medical Rec #: [id number]      Accession #:    [id number] Date of Birth: [birth year]       Patient Gender: F Patient Age:   72 years Exam Location:  [facility] Procedure:      VAS US DOPPLER PRE CABG Referring Phys: Eugenio Hoes --------------------------------------------------------------------------------  Indications:      Pre-CABG. Risk Factors:     Hypertension, hyperlipidemia, prior MI, coronary artery                   disease, prior CVA. Comparison Study: Previous carotid 09/02/2015. Performing Technologist: McKayla Maag RVT, [place]  Examination Guidelines: A complete evaluation includes B-mode imaging, spectral Doppler, color Doppler, and power Doppler as needed of all accessible portions of each vessel. Bilateral testing is considered an integral part of a complete examination. Limited examinations for reoccurring indications may be performed as noted.  Right Carotid Findings: +----------+--------+--------+--------+-------------------------------+--------+           PSV cm/sEDV cm/sStenosisDescribe                       Comments +----------+--------+--------+--------+-------------------------------+--------+ CCA Prox  67      9                                                        +----------+--------+--------+--------+-------------------------------+--------+ CCA Distal66      8                                                       +----------+--------+--------+--------+-------------------------------+--------+  ICA Prox  71      17      1-39%   smooth, calcific and                                                      homogeneous                             +----------+--------+--------+--------+-------------------------------+--------+ ICA Distal68      19                                                      +----------+--------+--------+--------+-------------------------------+--------+ ECA       101     1                                                       +----------+--------+--------+--------+-------------------------------+--------+ +----------+--------+-------+----------------+------------+           PSV cm/sEDV cmsDescribe        Arm Pressure +----------+--------+-------+----------------+------------+ Subclavian185            Multiphasic, WNL             +----------+--------+-------+----------------+------------+ +---------+--------+--+--------+--+---------+ VertebralPSV cm/s74EDV cm/s14Antegrade +---------+--------+--+--------+--+---------+ Left Carotid Findings: +----------+--------+--------+--------+-------------------------------+--------+           PSV cm/sEDV cm/sStenosisDescribe                       Comments +----------+--------+--------+--------+-------------------------------+--------+ CCA Prox  99      13              irregular and heterogenous              +----------+--------+--------+--------+-------------------------------+--------+ CCA Distal100     14              irregular and heterogenous              +----------+--------+--------+--------+-------------------------------+--------+ ICA Prox  79      15      1-39%   smooth, calcific and                                                       homogeneous                             +----------+--------+--------+--------+-------------------------------+--------+ ICA Distal68      19                                                      +----------+--------+--------+--------+-------------------------------+--------+ ECA       111     2                                                       +----------+--------+--------+--------+-------------------------------+--------+ +----------+--------+--------+----------------+------------+  SubclavianPSV cm/sEDV cm/sDescribe        Arm Pressure +----------+--------+--------+----------------+------------+           77              Multiphasic, WNL             +----------+--------+--------+----------------+------------+ +---------+--------+---+--------+--+---------+ VertebralPSV cm/s122EDV cm/s29Antegrade +---------+--------+---+--------+--+---------+  ABI Findings: +---------+------------------+-----+---------+---------------------------------+ Right    Rt Pressure (mmHg)IndexWaveform Comment                           +---------+------------------+-----+---------+---------------------------------+ Brachial                        triphasicNo pressure obtained due to IV                                             placement.                        +---------+------------------+-----+---------+---------------------------------+ PTA      172               1.10 triphasic                                  +---------+------------------+-----+---------+---------------------------------+ DP       158               1.01 triphasic                                  +---------+------------------+-----+---------+---------------------------------+ Great Toe153               0.98 Normal                                     +---------+------------------+-----+---------+---------------------------------+ +---------+------------------+-----+---------+-------+ Left      Lt Pressure (mmHg)IndexWaveform Comment +---------+------------------+-----+---------+-------+ Brachial 156                    triphasic        +---------+------------------+-----+---------+-------+ PTA      187               1.20 triphasic        +---------+------------------+-----+---------+-------+ DP       165               1.06 triphasic        +---------+------------------+-----+---------+-------+ Randie Heinz Toe158               1.01 Normal           +---------+------------------+-----+---------+-------+ +-------+---------------+----------------+ ABI/TBIToday's ABI/TBIPrevious ABI/TBI +-------+---------------+----------------+ Right  1.10           0.98             +-------+---------------+----------------+ Left   1.20           1.01             +-------+---------------+----------------+  Right Doppler Findings: +--------+--------+-----+---------+-----------------------------------------+ Site    PressureIndexDoppler  Comments                                  +--------+--------+-----+---------+-----------------------------------------+  Brachial             triphasicNo pressure obtained due to IV placement. +--------+--------+-----+---------+-----------------------------------------+ Radial               triphasic                                          +--------+--------+-----+---------+-----------------------------------------+ Ulnar                triphasic                                          +--------+--------+-----+---------+-----------------------------------------+  Left Doppler Findings: +--------+--------+-----+---------+--------+ Site    PressureIndexDoppler  Comments +--------+--------+-----+---------+--------+ [id number]          triphasic         +--------+--------+-----+---------+--------+ Radial               triphasic         +--------+--------+-----+---------+--------+ Ulnar                triphasic          +--------+--------+-----+---------+--------+   Summary: Right Carotid: Velocities in the right ICA are consistent with a 1-39% stenosis. Left Carotid: Velocities in the left ICA are consistent with a 1-39% stenosis. Vertebrals:  Bilateral vertebral arteries demonstrate antegrade flow. Subclavians: Normal flow hemodynamics were seen in bilateral subclavian              arteries. Right ABI: Resting right ankle-brachial index is within normal range. The right toe-brachial index is normal. Left ABI: Resting left ankle-brachial index is within normal range. The left toe-brachial index is normal. Right Upper Extremity: Doppler waveforms decrease <50% with right ulnar compression. Left Upper Extremity: Doppler waveforms remain within normal limits with left radial compression. Doppler waveforms decrease <50% with left ulnar compression.  Electronically signed by Heath Lark on 02/14/2023 at 5:40:25 PM.    Final    CARDIAC CATHETERIZATION  Result Date: 02/14/2023   Prox LAD to Mid LAD lesion is 99% stenosed.   1st Diag lesion is 50% stenosed.   Prox Cx to Mid Cx lesion is 25% stenosed.   Mid Cx lesion is 25% stenosed.   Prox RCA to Mid RCA lesion is 25% stenosed.   Non-stenotic Mid LAD lesion was previously treated.   The left ventricular systolic function is normal.   LV end diastolic pressure is normal.   The left ventricular ejection fraction is 55-65% by visual estimate. 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 The vessel has 2 layers of stent in this area. It has previously been treated with high pressure [place] balloon dilation 4 months ago. Normal LV function Normal LVEDP Plan: I think the LAD is poorly suited for repeat PCI. She has 2 layers of stent in a relatively small vessel. Adding a 3rd layer of stent is not advisable. Repeat POBA at this point will have a predictably short term result. Would recommend CT surgery consult for consideration of LIMA to LAD. Will hold Plavix.     Patient  Profile     72 y.o. 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 - on ASA, plavix held for CABG Eval -  5 mg amlodipine for  anti-anginal effects  -in ACEi - on Imdur 120 mg PO daily - on new ranolazine 500 mg PO BID -  atenolol dose to 100 mg - CABG eval pending    Mild Mitral stenosis -with severe MAC on echo, mild posterior calcification on CT from 2023 - increased beta blocker dosing; MVA mildly decrease on 02/14/23 2D echo, not planned for MVR at this time  Hypertensive urgency Hypertension - improved with uptitration of therapy above   Hyperlipidemia with LDL goal < 55 - Well-controlled on 20 mg crestot    IDDM Hyperglycemia Per primary    For questions or updates, please contact [facility] Please consult [email or web address] for contact info under Cardiology/STEMI.      Riley Lam, MD FASE [facility] Cardiologist [facility]  [address] [place], [address] [place], [place] [address] [phone]  9:33 AM

## 2023-02-17 DIAGNOSIS — I200 Unstable angina: Secondary | ICD-10-CM | POA: Diagnosis not present

## 2023-02-17 LAB — GLUCOSE, CAPILLARY
Glucose-Capillary: 200 mg/dL — ABNORMAL HIGH (ref 70–99)
Glucose-Capillary: 299 mg/dL — ABNORMAL HIGH (ref 70–99)
Glucose-Capillary: 121 mg/dL — ABNORMAL HIGH (ref 70–99)
Glucose-Capillary: 181 mg/dL — ABNORMAL HIGH (ref 70–99)

## 2023-02-17 LAB — CBC
HCT: 32.4 % — ABNORMAL LOW (ref 36.0–46.0)
Hemoglobin: 11.3 g/dL — ABNORMAL LOW (ref 12.0–15.0)
MCH: 29.7 pg (ref 26.0–34.0)
MCHC: 34.9 g/dL (ref 30.0–36.0)
MCV: 85.3 fL (ref 80.0–100.0)
Platelets: 134 10*3/uL — ABNORMAL LOW (ref 150–400)
RBC: 3.8 MIL/uL — ABNORMAL LOW (ref 3.87–5.11)
RDW: 13.8 % (ref 11.5–15.5)
WBC: 7.1 10*3/uL (ref 4.0–10.5)
nRBC: 0 % (ref 0.0–0.2)

## 2023-02-17 LAB — HEPARIN LEVEL (UNFRACTIONATED): Heparin Unfractionated: 0.33 IU/mL (ref 0.30–0.70)

## 2023-02-17 MED ORDER — INSULIN GLARGINE-YFGN 100 UNIT/ML ~~LOC~~ SOLN
50.0000 [IU] | Freq: Every day | SUBCUTANEOUS | Status: DC
  Administered 2023-02-17 – 2023-02-18 (×2): 50 [IU] via SUBCUTANEOUS
  Filled 2023-02-17 (×3): qty 0.5

## 2023-02-17 NOTE — Progress Notes (Signed)
Progress Note  Patient Name: [name] Date of Encounter: 02/17/2023  Primary Cardiologist: Rollene Rotunda, MD   Subjective   Patient feels fine. Anxious about surgery and has questions on timing.  Inpatient Medications    Scheduled Meds:  amLODipine  2.5 mg Oral Daily   aspirin EC  81 mg Oral Daily   atenolol  100 mg Oral BID   docusate sodium  100 mg Oral BID   [START ON 02/19/2023] epinephrine  0-10 mcg/min Intravenous To OR   furosemide  40 mg Oral Daily   [START ON 02/19/2023] heparin sodium (porcine) 2,500 Units, papaverine 30 mg in electrolyte-A (PLASMALYTE-A PH 7.4) 500 mL irrigation   Irrigation To OR   insulin aspart  0-15 Units Subcutaneous TID WC   insulin aspart  0-5 Units Subcutaneous QHS   insulin glargine-yfgn  50 Units Subcutaneous Daily   [START ON 02/19/2023] insulin   Intravenous To OR   isosorbide mononitrate  120 mg Oral Daily   [START ON 02/19/2023] [place] Blood Cardioplegia vial (lidocaine/magnesium/mannitol 0.26g-4g-6.4g)   Intracoronary To OR   lisinopril  10 mg Oral Daily   [START ON 02/19/2023] phenylephrine  30-200 mcg/min Intravenous To OR   polyethylene glycol  17 g Oral Daily   potassium chloride  10 mEq Oral BID   [START ON 02/19/2023] potassium chloride  80 mEq Other To OR   ranolazine  500 mg Oral BID   rosuvastatin  20 mg Oral Daily   sodium chloride flush  3 mL Intravenous Q12H   [START ON 02/19/2023] tranexamic acid  15 mg/kg Intravenous To OR   [START ON 02/19/2023] tranexamic acid  2 mg/kg Intracatheter To OR   Continuous Infusions:  sodium chloride Stopped (02/14/23 0530)   sodium chloride     [START ON 02/19/2023]  ceFAZolin (ANCEF) IV     [START ON 02/19/2023]  ceFAZolin (ANCEF) IV     [START ON 02/19/2023] dexmedetomidine     [START ON 02/19/2023] heparin 30,000 units/NS 1000 mL solution for CELLSAVER     heparin 800 Units/hr (02/16/23 1547)   [START ON 02/19/2023] milrinone     [START ON 02/19/2023] nitroGLYCERIN     [START  ON 02/19/2023] norepinephrine     [START ON 02/19/2023] tranexamic acid (CYKLOKAPRON) 2,500 mg in sodium chloride 0.9 % 250 mL (10 mg/mL) infusion     [START ON 02/19/2023] vancomycin     PRN Meds: sodium chloride, acetaminophen, nitroGLYCERIN, ondansetron (ZOFRAN) IV, rOPINIRole, sodium chloride flush, traZODone   Vital Signs    Vitals:   02/16/23 2018 02/16/23 2300 02/17/23 0421 02/17/23 0732  BP: 121/60 (!) 149/63 (!) 140/71 (!) 142/83  Pulse: 73  69 71  Resp: 18 20 14 17   Temp: 98.5 F (36.9 C) 98.5 F (36.9 C) 98.5 F (36.9 C) 98.5 F (36.9 C)  TempSrc: Oral Oral Oral Oral  SpO2: 97% 96% 94% 96%  Weight:   66.2 kg   Height:        Intake/Output Summary (Last 24 hours) at 02/17/2023 0751 Last data filed at 02/16/2023 2359 Gross per 24 hour  Intake --  Output 1100 ml  Net -1100 ml   Filed Weights   02/15/23 0407 02/16/23 0423 02/17/23 0421  Weight: 66.4 kg 65.8 kg 66.2 kg    Telemetry    SR rare PVC- Personally Reviewed  Physical Exam   Gen: no distress   Neck: No JVD Cardiac: No Rubs or Gallops, RRR +2 radial pulses no murmur Respiratory:  Clear to auscultation bilaterally, normal effort, normal  respiratory rate GI: Soft, nontender, non-distended  MS: No  edema;  moves all extremities Integument: Skin feels warm Neuro:  At time of evaluation, alert and oriented to person/place/time/situation  Psych: Normal affect   Labs    Chemistry Recent Labs  Lab 02/13/23 1001 02/14/23 0113 02/15/23 0059  NA 134* 136 135  K 3.4* 2.9* 4.0  CL 92* 96* 100  CO2 25 29 24   GLUCOSE 354* 105* 186*  BUN 19 17 16   CREATININE 1.24* 1.14* 1.05*  CALCIUM 9.0 9.1 9.4  PROT  --  6.7  --   ALBUMIN  --  3.2*  --   AST  --  28  --   ALT  --  14  --   ALKPHOS  --  46  --   BILITOT  --  0.6  --   GFRNONAA 46* 51* 56*  ANIONGAP 17* 11 11     Hematology Recent Labs  Lab 02/15/23 0059 02/16/23 0057 02/17/23 0129  WBC 10.9* 8.4 7.1  RBC 3.94 3.71* 3.80*  HGB 11.6*  10.8* 11.3*  HCT 33.7* 32.2* 32.4*  MCV 85.5 86.8 85.3  MCH 29.4 29.1 29.7  MCHC 34.4 33.5 34.9  RDW 13.4 13.5 13.8  PLT 165 131* 134*    Cardiac EnzymesNo results for input(s): "TROPONINI" in the last 168 hours. No results for input(s): "TROPIPOC" in the last 168 hours.   BNPNo results for input(s): "BNP", "PROBNP" in the last 168 hours.   DDimer No results for input(s): "DDIMER" in the last 168 hours.   Radiology    No results found.   Patient Profile     72 y.o. female ISR and unstable angina  Assessment & Plan     Unstable angina CAD with DES-LCX, DES-LAD, ISR  - on ASA, plavix held for CABG (planned 02/19/23) -  2.5 mg amlodipine for anti-anginal effects  -Lisinopriol 10 with improving AKI (home med) - on Imdur 120 mg PO daily (this may be stopped post CABG) - New ranolazine 500 mg PO BID this may be stopped post CABG - New atenolol dose to 100 mg (this will likely be stopped post CABG unless HTN) - on heparin prior to intervention   Mild Mitral stenosis -with severe MAC on echo, mild posterior calcification on CT from 2023 -BB as above; MVA mildly decrease on 02/14/23 2D echo, not planned for MVR at this time  Hypertensive urgency Hypertension - improved with uptitration of therapy above   Hyperlipidemia with LDL goal < 55 - Well-controlled on 20 mg crestor  IDDM Hyperglycemia Per primary    For questions or updates, please contact [facility] Please consult [email or web address] for contact info under Cardiology/STEMI.      Riley Lam, MD FASE [employer] Cardiologist [facility]  [address] [place], [address] [place], [place] [address] [phone]  7:51 AM

## 2023-02-17 NOTE — Progress Notes (Signed)
ANTICOAGULATION CONSULT NOTE  Pharmacy Consult for heparin Indication: chest pain/ACS  Allergies  Allergen Reactions   Iohexol      Desc: pt had syncopal episode with nausea post IV CM late 1990's,  pt has had prednisone prep with heart caths x 2 without problem  kdean 04/16/07, Onset Date: [id number]    Ticlid [Ticlopidine Hcl] Nausea And Vomiting   Jardiance [Empagliflozin] Other (See Comments)    Recurrent UTIs   Metformin And Related Diarrhea   Codeine Nausea And Vomiting and Palpitations    Patient Measurements: Height: 5\' 6"  (167.6 cm) Weight: 66.2 kg (145 lb 15.1 oz) IBW/kg (Calculated) : 59.3 HEPARIN DW (KG): 65.8   Vital Signs: Temp: 98.5 F (36.9 C) (05/26 0732) Temp Source: Oral (05/26 0732) BP: 142/83 (05/26 0732) Pulse Rate: 71 (05/26 0732)  Labs: Recent Labs    02/15/23 0059 02/15/23 1108 02/16/23 0057 02/16/23 0952 02/17/23 0129  HGB 11.6*  --  10.8*  --  11.3*  HCT 33.7*  --  32.2*  --  32.4*  PLT 165  --  131*  --  134*  HEPARINUNFRC 0.38   < > 0.71* 0.48 0.33  CREATININE 1.05*  --   --   --   --    < > = values in this interval not displayed.    Estimated Creatinine Clearance: 44 mL/min (A) (by C-G formula based on SCr of 1.05 mg/dL (H)).   Medical History: Past Medical History:  Diagnosis Date   Anxiety    CAD (coronary artery disease)    DES to circumflex 02/2007, BMS to LAD and PTCA diagonal 03/2007   Carotid artery plaque    Mild   Cataract    Depression    Diverticulitis, colon    Elevated d-dimer 01/08/2014   Essential hypertension, benign    GERD (gastroesophageal reflux disease)    H/O hiatal hernia    HLD (hyperlipidemia)    IDDM (insulin dependent diabetes mellitus)    Migraine    "used to have them really bad; don't have them anymore" (01/07/2014)   MS (multiple sclerosis) (HCC)    Not confirmed   PAT (paroxysmal atrial tachycardia)    Prolapse of uterus    PVD (peripheral vascular disease) (HCC)    TIA (transient ischemic  attack) 1980's    Assessment: 72 year old female presents to ED with chest pain, nausea, and vomiting. Patient does have history of CAD s/p stents. Pharmacy consulted to dose heparin for ACS/chest pain.   Patient is now s/p cath with noted LAD disease with plan for CABG 5/28. Heparin resumed 5/23 after TR band removal. Today, heparin level 0.33 on drip rate 800 units/hr therapeutic. Heparin level continues to be within range. Hgb 11.3 and plt 134, stable. No s/sx bleeding or issues with infusion noted.   Goal of Therapy:  Heparin level 0.3-0.7 units/ml Monitor platelets by anticoagulation protocol: Yes   Plan:  Continue heparin infusion at 800 units/hr Check heparin level and CBC daily Continue to monitor H&H and platelets  Thank you for allowing pharmacy to be a part of this patient's care.  Jerry Caras, PharmD PGY1 Pharmacy Resident   02/17/2023 9:01 AM

## 2023-02-17 NOTE — Progress Notes (Signed)
PROGRESS NOTE    [name]  WUJ:[id number] DOB: 26-Dec-1950 DOA: 02/12/2023 PCP: Gabriel Earing, FNP   Brief Narrative:  [name], a 72 y/o with medical h/o DM-on insulin,HLD, GERD and CAD. Her most recent cardiac history indicated PCI July 2023 for restenosis LAD proximal to previous stent; PCY Jan 2024 for unstable angina with PCI for instent stenosis of LAD. Last Cardiology OV note 11/21/22 she was stable. On the day of admission she experienced N/V and chest pain similar to previous episodes of angina. EMS activated. In route to [facility] she was given ASA and sl NTG.     ED Course: T 98.1  171/84  RR 18. Patient uncomfortable per EDP but hemodynamically stable. Lab K 3.0  CBC nl, Troponin 21, 21. EKG w/o STEMI or acute changes. Last ECHO 10/05/22- EF 60-65%, mild MoV regurg. TRH called to admit for continued management of unstable angina/ACS  Assessment & Plan:   Principal Problem:   Unstable angina (HCC) Active Problems:   Coronary artery disease involving native coronary artery of native heart with unstable angina pectoris (HCC)   Hypertension associated with diabetes (HCC)   Hyperlipidemia associated with type 2 diabetes mellitus (HCC)   Type 2 diabetes mellitus with hyperglycemia (HCC)   Gastroesophageal reflux disease without esophagitis   Generalized anxiety disorder  Unstable angina in a patient with history of coronary artery disease involving native coronary artery of native heart:  Patient with h/o obstructive CAD s/p Stenting x 4, last Jan 2024.  Needed nitro drip for 1 to 2 days for chest pain which was discontinued eventually.  Underwent cardiac cath 02/14/2023 and was found to have severe single-vessel LAD CAD and since she has had multiple previous percutaneous interventions and further interventions were not recommended by cardiology.  Patient seen by CT surgery, plan for CABG on 02/19/2023.  Patient remains in the hospital till then and continues to be on heparin.   She is asymptomatic today.   Hypertension: Blood pressure controlled.  Continue current medications.   Generalized anxiety disorder  Continue trazadon   Gastroesophageal reflux disease without esophagitis Continue PPI.   Type 2 diabetes mellitus with hyperglycemia (HCC) Patient on Novolog at meals, [place] daily - she adjusts her dose and Ozempic weekly. Last A1C 02/13/23 7.8%.  It appears that she takes 60 units of Tresiba at home.  She is currently on 40 units of Semglee, still hyperglycemic, will increase to 50 units and continue SSI.   Hyperlipidemia associated with type 2 diabetes mellitus (HCC) LDL at goal but HDL low.  Continue Crestor.  DVT prophylaxis: Heparin drip   Code Status: Full Code  Family Communication:  None present at bedside.  Plan of care discussed with patient in length and he/she verbalized understanding and agreed with it.  Status is: Inpatient Remains inpatient appropriate because: Patient is to stay in the hospital on heparin drip until CABG is done which is scheduled on 02/19/2023.  Estimated body mass index is 23.56 kg/m as calculated from the following:   Height as of this encounter: 5\' 6"  (1.676 m).   Weight as of this encounter: 66.2 kg.    Nutritional Assessment: Body mass index is 23.56 kg/m.Marland Kitchen Seen by dietician.  I agree with the assessment and plan as outlined below: Nutrition Status:        . Skin Assessment: I have examined the patient's skin and I agree with the wound assessment as performed by the wound care RN as outlined below:  Consultants:  Cardiology  Procedures:  As above  Antimicrobials:  Anti-infectives (From admission, onward)    Start     Dose/Rate Route Frequency Ordered Stop   02/19/23 0400  vancomycin (VANCOREADY) IVPB 1250 mg/250 mL        1,250 mg 166.7 mL/hr over 90 Minutes Intravenous To Surgery 02/15/23 1320 02/20/23 0400   02/19/23 0400  ceFAZolin (ANCEF) IVPB 2g/100 mL premix        2 g 200 mL/hr  over 30 Minutes Intravenous To Surgery 02/15/23 1320 02/20/23 0400   02/19/23 0400  ceFAZolin (ANCEF) IVPB 2g/100 mL premix        2 g 200 mL/hr over 30 Minutes Intravenous To Surgery 02/15/23 1320 02/20/23 0400         Subjective: Seen and examined.  Feels well.  No chest pain today.  Objective: Vitals:   02/16/23 2300 02/17/23 0421 02/17/23 0732 02/17/23 1053  BP: (!) 149/63 (!) 140/71 (!) 142/83 132/64  Pulse:  69 71 74  Resp: 20 14 17 18   Temp: 98.5 F (36.9 C) 98.5 F (36.9 C) 98.5 F (36.9 C) 98.5 F (36.9 C)  TempSrc: Oral Oral Oral Oral  SpO2: 96% 94% 96% 94%  Weight:  66.2 kg    Height:        Intake/Output Summary (Last 24 hours) at 02/17/2023 1056 Last data filed at 02/17/2023 1056 Gross per 24 hour  Intake 360 ml  Output 1500 ml  Net -1140 ml    Filed Weights   02/15/23 0407 02/16/23 0423 02/17/23 0421  Weight: 66.4 kg 65.8 kg 66.2 kg    Examination:  General exam: Appears calm and comfortable  Respiratory system: Clear to auscultation. Respiratory effort normal. Cardiovascular system: S1 & S2 heard, RRR. No JVD, murmurs, rubs, gallops or clicks. No pedal edema. Gastrointestinal system: Abdomen is nondistended, soft and nontender. No organomegaly or masses felt. Normal bowel sounds heard. Central nervous system: Alert and oriented. No focal neurological deficits. Extremities: Symmetric 5 x 5 power. Skin: No rashes, lesions or ulcers.  Psychiatry: Judgement and insight appear normal. Mood & affect appropriate.    Data Reviewed: I have personally reviewed following labs and imaging studies  CBC: Recent Labs  Lab 02/13/23 0108 02/14/23 0113 02/15/23 0059 02/16/23 0057 02/17/23 0129  WBC 10.0 7.4 10.9* 8.4 7.1  HGB 12.0 11.2* 11.6* 10.8* 11.3*  HCT 34.6* 32.1* 33.7* 32.2* 32.4*  MCV 85.6 84.3 85.5 86.8 85.3  PLT 188 165 165 131* 134*    Basic Metabolic Panel: Recent Labs  Lab 02/12/23 1442 02/13/23 1001 02/14/23 0113 02/15/23 0059   NA 134* 134* 136 135  K 3.0* 3.4* 2.9* 4.0  CL 95* 92* 96* 100  CO2 27 25 29 24   GLUCOSE 232* 354* 105* 186*  BUN 13 19 17 16   CREATININE 1.11* 1.24* 1.14* 1.05*  CALCIUM 9.1 9.0 9.1 9.4    GFR: Estimated Creatinine Clearance: 44 mL/min (A) (by C-G formula based on SCr of 1.05 mg/dL (H)). Liver Function Tests: Recent Labs  Lab 02/14/23 0113  AST 28  ALT 14  ALKPHOS 46  BILITOT 0.6  PROT 6.7  ALBUMIN 3.2*    No results for input(s): "LIPASE", "AMYLASE" in the last 168 hours. No results for input(s): "AMMONIA" in the last 168 hours. Coagulation Profile: No results for input(s): "INR", "PROTIME" in the last 168 hours. Cardiac Enzymes: No results for input(s): "CKTOTAL", "CKMB", "CKMBINDEX", "TROPONINI" in the last 168 hours. BNP (last 3 results) No results  for input(s): "PROBNP" in the last 8760 hours. HbA1C: No results for input(s): "HGBA1C" in the last 72 hours.  CBG: Recent Labs  Lab 02/16/23 1129 02/16/23 1634 02/16/23 2123 02/17/23 0559 02/17/23 1050  GLUCAP 215* 185* 157* 200* 299*    Lipid Profile: No results for input(s): "CHOL", "HDL", "LDLCALC", "TRIG", "CHOLHDL", "LDLDIRECT" in the last 72 hours.  Thyroid Function Tests: No results for input(s): "TSH", "T4TOTAL", "FREET4", "T3FREE", "THYROIDAB" in the last 72 hours. Anemia Panel: No results for input(s): "VITAMINB12", "FOLATE", "FERRITIN", "TIBC", "IRON", "RETICCTPCT" in the last 72 hours. Sepsis Labs: No results for input(s): "PROCALCITON", "LATICACIDVEN" in the last 168 hours.  Recent Results (from the past 240 hour(s))  Surgical pcr screen     Status: None   Collection Time: 02/15/23  8:02 AM   Specimen: Nasal Mucosa; Nasal Swab  Result Value Ref Range Status   MRSA, PCR NEGATIVE NEGATIVE Final   Staphylococcus aureus NEGATIVE NEGATIVE Final    Comment: (NOTE) The Xpert SA Assay (FDA approved for NASAL specimens in patients 92 years of age and older), is one component of a  comprehensive surveillance program. It is not intended to diagnose infection nor to guide or monitor treatment. Performed at [facility], [address] [place], [place] [address]       Radiology Studies: No results found.  Scheduled Meds:  amLODipine  2.5 mg Oral Daily   aspirin EC  81 mg Oral Daily   atenolol  100 mg Oral BID   docusate sodium  100 mg Oral BID   [START ON 02/19/2023] epinephrine  0-10 mcg/min Intravenous To OR   furosemide  40 mg Oral Daily   [START ON 02/19/2023] heparin sodium (porcine) 2,500 Units, papaverine 30 mg in electrolyte-A (PLASMALYTE-A PH 7.4) 500 mL irrigation   Irrigation To OR   insulin aspart  0-15 Units Subcutaneous TID WC   insulin aspart  0-5 Units Subcutaneous QHS   insulin glargine-yfgn  50 Units Subcutaneous Daily   [START ON 02/19/2023] insulin   Intravenous To OR   isosorbide mononitrate  120 mg Oral Daily   [START ON 02/19/2023] [place] Blood Cardioplegia vial (lidocaine/magnesium/mannitol 0.26g-4g-6.4g)   Intracoronary To OR   lisinopril  10 mg Oral Daily   [START ON 02/19/2023] phenylephrine  30-200 mcg/min Intravenous To OR   polyethylene glycol  17 g Oral Daily   potassium chloride  10 mEq Oral BID   [START ON 02/19/2023] potassium chloride  80 mEq Other To OR   ranolazine  500 mg Oral BID   rosuvastatin  20 mg Oral Daily   sodium chloride flush  3 mL Intravenous Q12H   [START ON 02/19/2023] tranexamic acid  15 mg/kg Intravenous To OR   [START ON 02/19/2023] tranexamic acid  2 mg/kg Intracatheter To OR   Continuous Infusions:  sodium chloride Stopped (02/14/23 0530)   sodium chloride     [START ON 02/19/2023]  ceFAZolin (ANCEF) IV     [START ON 02/19/2023]  ceFAZolin (ANCEF) IV     [START ON 02/19/2023] dexmedetomidine     [START ON 02/19/2023] heparin 30,000 units/NS 1000 mL solution for CELLSAVER     heparin 800 Units/hr (02/16/23 1547)   [START ON 02/19/2023] milrinone     [START ON 02/19/2023] nitroGLYCERIN     [START ON  02/19/2023] norepinephrine     [START ON 02/19/2023] tranexamic acid (CYKLOKAPRON) 2,500 mg in sodium chloride 0.9 % 250 mL (10 mg/mL) infusion     [START ON 02/19/2023]  vancomycin       LOS: 5 days   Hughie Closs, MD [facility]  02/17/2023, 10:56 AM   *Please note that this is a verbal dictation therefore any spelling or grammatical errors are due to the "[employer]agon [employer] One" system interpretation.  Please page via Amion and do not message via secure chat for urgent patient care matters. Secure chat can be used for non urgent patient care matters.  How to contact the [facility] Attending or Consulting provider 7A - 7P or covering provider during after hours 7P -7A, for this patient?  Check the care team in [facility] and look for a) attending/consulting TR[facility] provider listed and b) the [facility] team listed. Page or secure chat 7A-7P. Log into [email or web address] and use [place]'s universal password to access. If you do not have the password, please contact the hospital operator. Locate the [facility] provider you are looking for under [facility] and page to a number that you can be directly reached. If you still have difficulty reaching the provider, please page the [facility] (Director on Call) for the Hospitalists listed on amion for assistance.

## 2023-02-17 NOTE — Plan of Care (Signed)
Pt continue on heparin gtt. Stated she will have open heart surgery on Tuesday according to her doctor. Complains of intermittent pressure not relieved by Tylenol. Repositioned self for comfort. No other concerns at this time.   Problem: Education: Goal: Understanding of cardiac disease, CV risk reduction, and recovery process will improve Outcome: Progressing Goal: Individualized Educational Video(s) Outcome: Progressing   Problem: Activity: Goal: Ability to tolerate increased activity will improve Outcome: Progressing   Problem: Cardiac: Goal: Ability to achieve and maintain adequate cardiovascular perfusion will improve Outcome: Progressing   Problem: Health Behavior/Discharge Planning: Goal: Ability to safely manage health-related needs after discharge will improve Outcome: Progressing   Problem: Education: Goal: Knowledge of General Education information will improve Description: Including pain rating scale, medication(s)/side effects and non-pharmacologic comfort measures Outcome: Progressing   Problem: Health Behavior/Discharge Planning: Goal: Ability to manage health-related needs will improve Outcome: Progressing   Problem: Clinical Measurements: Goal: Ability to maintain clinical measurements within normal limits will improve Outcome: Progressing Goal: Will remain free from infection Outcome: Progressing Goal: Diagnostic test results will improve Outcome: Progressing Goal: Respiratory complications will improve Outcome: Progressing Goal: Cardiovascular complication will be avoided Outcome: Progressing   Problem: Activity: Goal: Risk for activity intolerance will decrease Outcome: Progressing   Problem: Nutrition: Goal: Adequate nutrition will be maintained Outcome: Progressing   Problem: Coping: Goal: Level of anxiety will decrease Outcome: Progressing   Problem: Elimination: Goal: Will not experience complications related to bowel motility Outcome:  Progressing Goal: Will not experience complications related to urinary retention Outcome: Progressing   Problem: Pain Managment: Goal: General experience of comfort will improve Outcome: Progressing   Problem: Safety: Goal: Ability to remain free from injury will improve Outcome: Progressing   Problem: Skin Integrity: Goal: Risk for impaired skin integrity will decrease Outcome: Progressing   Problem: Education: Goal: Ability to describe self-care measures that may prevent or decrease complications (Diabetes Survival Skills Education) will improve Outcome: Progressing Goal: Individualized Educational Video(s) Outcome: Progressing   Problem: Coping: Goal: Ability to adjust to condition or change in health will improve Outcome: Progressing   Problem: Fluid Volume: Goal: Ability to maintain a balanced intake and output will improve Outcome: Progressing   Problem: Health Behavior/Discharge Planning: Goal: Ability to identify and utilize available resources and services will improve Outcome: Progressing Goal: Ability to manage health-related needs will improve Outcome: Progressing   Problem: Metabolic: Goal: Ability to maintain appropriate glucose levels will improve Outcome: Progressing   Problem: Nutritional: Goal: Maintenance of adequate nutrition will improve Outcome: Progressing Goal: Progress toward achieving an optimal weight will improve Outcome: Progressing   Problem: Skin Integrity: Goal: Risk for impaired skin integrity will decrease Outcome: Progressing   Problem: Tissue Perfusion: Goal: Adequacy of tissue perfusion will improve Outcome: Progressing   Problem: Education: Goal: Understanding of CV disease, CV risk reduction, and recovery process will improve Outcome: Progressing Goal: Individualized Educational Video(s) Outcome: Progressing   Problem: Activity: Goal: Ability to return to baseline activity level will improve Outcome: Progressing    Problem: Cardiovascular: Goal: Ability to achieve and maintain adequate cardiovascular perfusion will improve Outcome: Progressing Goal: Vascular access site(s) Level 0-1 will be maintained Outcome: Progressing   Problem: Health Behavior/Discharge Planning: Goal: Ability to safely manage health-related needs after discharge will improve Outcome: Progressing

## 2023-02-18 DIAGNOSIS — I200 Unstable angina: Secondary | ICD-10-CM | POA: Diagnosis not present

## 2023-02-18 LAB — CBC
HCT: 35.5 % — ABNORMAL LOW (ref 36.0–46.0)
Hemoglobin: 12.3 g/dL (ref 12.0–15.0)
MCH: 30.8 pg (ref 26.0–34.0)
MCHC: 34.6 g/dL (ref 30.0–36.0)
MCV: 88.8 fL (ref 80.0–100.0)
Platelets: 126 K/uL — ABNORMAL LOW (ref 150–400)
RBC: 4 MIL/uL (ref 3.87–5.11)
RDW: 13.7 % (ref 11.5–15.5)
WBC: 9.2 K/uL (ref 4.0–10.5)
nRBC: 0 % (ref 0.0–0.2)

## 2023-02-18 LAB — PLATELET INHIBITION P2Y12

## 2023-02-18 LAB — TYPE AND SCREEN
ABO/RH(D): A NEG
Antibody Screen: NEGATIVE

## 2023-02-18 LAB — HEPARIN LEVEL (UNFRACTIONATED): Heparin Unfractionated: 0.61 IU/mL (ref 0.30–0.70)

## 2023-02-18 LAB — GLUCOSE, CAPILLARY
Glucose-Capillary: 109 mg/dL — ABNORMAL HIGH (ref 70–99)
Glucose-Capillary: 233 mg/dL — ABNORMAL HIGH (ref 70–99)
Glucose-Capillary: 136 mg/dL — ABNORMAL HIGH (ref 70–99)
Glucose-Capillary: 100 mg/dL — ABNORMAL HIGH (ref 70–99)

## 2023-02-18 MED ORDER — BISACODYL 5 MG PO TBEC
5.0000 mg | DELAYED_RELEASE_TABLET | Freq: Once | ORAL | Status: AC
  Administered 2023-02-18: 5 mg via ORAL
  Filled 2023-02-18: qty 1

## 2023-02-18 MED ORDER — CHLORHEXIDINE GLUCONATE 0.12 % MT SOLN
15.0000 mL | Freq: Once | OROMUCOSAL | Status: AC
  Administered 2023-02-19: 15 mL via OROMUCOSAL
  Filled 2023-02-18: qty 15

## 2023-02-18 MED ORDER — TEMAZEPAM 7.5 MG PO CAPS
15.0000 mg | ORAL_CAPSULE | Freq: Once | ORAL | Status: AC | PRN
  Administered 2023-02-18: 15 mg via ORAL
  Filled 2023-02-18: qty 2

## 2023-02-18 MED ORDER — CHLORHEXIDINE GLUCONATE CLOTH 2 % EX PADS
6.0000 | MEDICATED_PAD | Freq: Once | CUTANEOUS | Status: AC
  Administered 2023-02-19: 6 via TOPICAL

## 2023-02-18 MED ORDER — METOPROLOL TARTRATE 12.5 MG HALF TABLET
12.5000 mg | ORAL_TABLET | Freq: Once | ORAL | Status: AC
  Administered 2023-02-19: 12.5 mg via ORAL
  Filled 2023-02-18: qty 1

## 2023-02-18 MED ORDER — CHLORHEXIDINE GLUCONATE CLOTH 2 % EX PADS
6.0000 | MEDICATED_PAD | Freq: Once | CUTANEOUS | Status: AC
  Administered 2023-02-18: 6 via TOPICAL

## 2023-02-18 NOTE — Progress Notes (Signed)
PROGRESS NOTE    [name]  GNF:[id number] DOB: [birth year] DOA: 02/12/2023 PCP: Gabriel Earing, FNP   Brief Narrative:  [name], a 72 y/o with medical h/o DM-on insulin,HLD, GERD and CAD. Her most recent cardiac history indicated PCI July 2023 for restenosis LAD proximal to previous stent; PCY Jan 2024 for unstable angina with PCI for instent stenosis of LAD. Last Cardiology OV note 11/21/22 she was stable. On the day of admission she experienced N/V and chest pain similar to previous episodes of angina. EMS activated. In route to [facility] she was given ASA and sl NTG. Needed nitro drip for 1 to 2 days for chest pain which was discontinued eventually.  Underwent cardiac cath 02/14/2023 and was found to have severe single-vessel LAD CAD and since she has had multiple previous percutaneous interventions and further interventions were not recommended by cardiology.  Patient seen by CT surgery, plan for CABG on 02/19/2023.  Patient remains in the hospital till then and continues to be on heparin.   Assessment & Plan:   Principal Problem:   Unstable angina (HCC) Active Problems:   Coronary artery disease involving native coronary artery of native heart with unstable angina pectoris (HCC)   Hypertension associated with diabetes (HCC)   Hyperlipidemia associated with type 2 diabetes mellitus (HCC)   Type 2 diabetes mellitus with hyperglycemia (HCC)   Gastroesophageal reflux disease without esophagitis   Generalized anxiety disorder  Unstable angina in a patient with history of coronary artery disease involving native coronary artery of native heart:  Patient with h/o obstructive CAD s/p Stenting x 4, last Jan 2024.  Needed nitro drip for 1 to 2 days for chest pain which was discontinued eventually.  Underwent cardiac cath 02/14/2023 and was found to have severe single-vessel LAD CAD and since she has had multiple previous percutaneous interventions and further interventions were not  recommended by cardiology.  Patient seen by CT surgery, plan for CABG on 02/19/2023.  Patient remains in the hospital till then and continues to be on heparin.  She is asymptomatic today.   Hypertension: Blood pressure controlled.  Continue current medications.   Generalized anxiety disorder  Continue trazadon   Gastroesophageal reflux disease without esophagitis Continue PPI.   Type 2 diabetes mellitus with hyperglycemia (HCC) Patient on Novolog at meals, [place] daily - she adjusts her dose and Ozempic weekly. Last A1C 02/13/23 7.8%.  It appears that she takes 60 units of Tresiba at home.  Currently on 50 units of Semglee and SSI and blood sugar very well-controlled.   Hyperlipidemia associated with type 2 diabetes mellitus (HCC) LDL at goal but HDL low.  Continue Crestor.  DVT prophylaxis: Heparin drip   Code Status: Full Code  Family Communication:  None present at bedside.  Plan of care discussed with patient in length and he/she verbalized understanding and agreed with it.  Status is: Inpatient Remains inpatient appropriate because: Patient is to stay in the hospital on heparin drip until CABG is done which is scheduled on 02/19/2023.  Estimated body mass index is 23.16 kg/m as calculated from the following:   Height as of this encounter: 5\' 6"  (1.676 m).   Weight as of this encounter: 65.1 kg.    Nutritional Assessment: Body mass index is 23.16 kg/m.Marland Kitchen Seen by dietician.  I agree with the assessment and plan as outlined below: Nutrition Status:        . Skin Assessment: I have examined the patient's skin and I agree with  the wound assessment as performed by the wound care RN as outlined below:    Consultants:  Cardiology  Procedures:  As above  Antimicrobials:  Anti-infectives (From admission, onward)    Start     Dose/Rate Route Frequency Ordered Stop   02/19/23 0400  vancomycin (VANCOREADY) IVPB 1250 mg/250 mL        1,250 mg 166.7 mL/hr over 90 Minutes  Intravenous To Surgery 02/15/23 1320 02/20/23 0400   02/19/23 0400  ceFAZolin (ANCEF) IVPB 2g/100 mL premix        2 g 200 mL/hr over 30 Minutes Intravenous To Surgery 02/15/23 1320 02/20/23 0400   02/19/23 0400  ceFAZolin (ANCEF) IVPB 2g/100 mL premix        2 g 200 mL/hr over 30 Minutes Intravenous To Surgery 02/15/23 1320 02/20/23 0400         Subjective: Patient seen and examined.  No chest pain today.  No other complaint.  Objective: Vitals:   02/17/23 1930 02/18/23 0041 02/18/23 0419 02/18/23 0905  BP: (!) 145/78 (!) 147/73 138/68   Pulse: 73 70 72   Resp: 20  20   Temp: 98.9 F (37.2 C) 98.3 F (36.8 C) 98.4 F (36.9 C) 98.8 F (37.1 C)  TempSrc: Oral Oral Oral Oral  SpO2: 95% 97% 97%   Weight:   65.1 kg   Height:        Intake/Output Summary (Last 24 hours) at 02/18/2023 1049 Last data filed at 02/17/2023 1754 Gross per 24 hour  Intake 270 ml  Output 1000 ml  Net -730 ml    Filed Weights   02/16/23 0423 02/17/23 0421 02/18/23 0419  Weight: 65.8 kg 66.2 kg 65.1 kg    Examination:  General exam: Appears calm and comfortable  Respiratory system: Clear to auscultation. Respiratory effort normal. Cardiovascular system: S1 & S2 heard, RRR. No JVD, murmurs, rubs, gallops or clicks. No pedal edema. Gastrointestinal system: Abdomen is nondistended, soft and nontender. No organomegaly or masses felt. Normal bowel sounds heard. Central nervous system: Alert and oriented. No focal neurological deficits. Extremities: Symmetric 5 x 5 power. Skin: No rashes, lesions or ulcers.   Data Reviewed: I have personally reviewed following labs and imaging studies  CBC: Recent Labs  Lab 02/14/23 0113 02/15/23 0059 02/16/23 0057 02/17/23 0129 02/18/23 0125  WBC 7.4 10.9* 8.4 7.1 9.2  HGB 11.2* 11.6* 10.8* 11.3* 12.3  HCT 32.1* 33.7* 32.2* 32.4* 35.5*  MCV 84.3 85.5 86.8 85.3 88.8  PLT 165 165 131* 134* 126*    Basic Metabolic Panel: Recent Labs  Lab  02/12/23 1442 02/13/23 1001 02/14/23 0113 02/15/23 0059  NA 134* 134* 136 135  K 3.0* 3.4* 2.9* 4.0  CL 95* 92* 96* 100  CO2 27 25 29 24   GLUCOSE 232* 354* 105* 186*  BUN 13 19 17 16   CREATININE 1.11* 1.24* 1.14* 1.05*  CALCIUM 9.1 9.0 9.1 9.4    GFR: Estimated Creatinine Clearance: 44 mL/min (A) (by C-G formula based on SCr of 1.05 mg/dL (H)). Liver Function Tests: Recent Labs  Lab 02/14/23 0113  AST 28  ALT 14  ALKPHOS 46  BILITOT 0.6  PROT 6.7  ALBUMIN 3.2*    No results for input(s): "LIPASE", "AMYLASE" in the last 168 hours. No results for input(s): "AMMONIA" in the last 168 hours. Coagulation Profile: No results for input(s): "INR", "PROTIME" in the last 168 hours. Cardiac Enzymes: No results for input(s): "CKTOTAL", "CKMB", "CKMBINDEX", "TROPONINI" in the last 168 hours. BNP (  last 3 results) No results for input(s): "PROBNP" in the last 8760 hours. HbA1C: No results for input(s): "HGBA1C" in the last 72 hours.  CBG: Recent Labs  Lab 02/17/23 0559 02/17/23 1050 02/17/23 1558 02/17/23 2100 02/18/23 0605  GLUCAP 200* 299* 121* 181* 109*    Lipid Profile: No results for input(s): "CHOL", "HDL", "LDLCALC", "TRIG", "CHOLHDL", "LDLDIRECT" in the last 72 hours.  Thyroid Function Tests: No results for input(s): "TSH", "T4TOTAL", "FREET4", "T3FREE", "THYROIDAB" in the last 72 hours. Anemia Panel: No results for input(s): "VITAMINB12", "FOLATE", "FERRITIN", "TIBC", "IRON", "RETICCTPCT" in the last 72 hours. Sepsis Labs: No results for input(s): "PROCALCITON", "LATICACIDVEN" in the last 168 hours.  Recent Results (from the past 240 hour(s))  Surgical pcr screen     Status: None   Collection Time: 02/15/23  8:02 AM   Specimen: Nasal Mucosa; Nasal Swab  Result Value Ref Range Status   MRSA, PCR NEGATIVE NEGATIVE Final   Staphylococcus aureus NEGATIVE NEGATIVE Final    Comment: (NOTE) The Xpert SA Assay (FDA approved for NASAL specimens in patients  90 years of age and older), is one component of a comprehensive surveillance program. It is not intended to diagnose infection nor to guide or monitor treatment. Performed at [facility], [address] [place], [place] [address]       Radiology Studies: No results found.  Scheduled Meds:  amLODipine  2.5 mg Oral Daily   aspirin EC  81 mg Oral Daily   atenolol  100 mg Oral BID   docusate sodium  100 mg Oral BID   [START ON 02/19/2023] epinephrine  0-10 mcg/min Intravenous To OR   furosemide  40 mg Oral Daily   [START ON 02/19/2023] heparin sodium (porcine) 2,500 Units, papaverine 30 mg in electrolyte-A (PLASMALYTE-A PH 7.4) 500 mL irrigation   Irrigation To OR   insulin aspart  0-15 Units Subcutaneous TID WC   insulin aspart  0-5 Units Subcutaneous QHS   insulin glargine-yfgn  50 Units Subcutaneous Daily   [START ON 02/19/2023] insulin   Intravenous To OR   isosorbide mononitrate  120 mg Oral Daily   [START ON 02/19/2023] [facility] Blood Cardioplegia vial (lidocaine/magnesium/mannitol 0.26g-4g-6.4g)   Intracoronary To OR   lisinopril  10 mg Oral Daily   [START ON 02/19/2023] phenylephrine  30-200 mcg/min Intravenous To OR   polyethylene glycol  17 g Oral Daily   potassium chloride  10 mEq Oral BID   [START ON 02/19/2023] potassium chloride  80 mEq Other To OR   ranolazine  500 mg Oral BID   rosuvastatin  20 mg Oral Daily   sodium chloride flush  3 mL Intravenous Q12H   [START ON 02/19/2023] tranexamic acid  15 mg/kg Intravenous To OR   [START ON 02/19/2023] tranexamic acid  2 mg/kg Intracatheter To OR   Continuous Infusions:  sodium chloride Stopped (02/14/23 0530)   sodium chloride     [START ON 02/19/2023]  ceFAZolin (ANCEF) IV     [START ON 02/19/2023]  ceFAZolin (ANCEF) IV     [START ON 02/19/2023] dexmedetomidine     [START ON 02/19/2023] heparin 30,000 units/NS 1000 mL solution for CELLSAVER     heparin 800 Units/hr (02/17/23 2202)   [START ON 02/19/2023] milrinone      [START ON 02/19/2023] nitroGLYCERIN     [START ON 02/19/2023] norepinephrine     [START ON 02/19/2023] tranexamic acid (CYKLOKAPRON) 2,500 mg in sodium chloride 0.9 % 250 mL (10 mg/mL) infusion     [  START ON 02/19/2023] vancomycin       LOS: 6 days   Hughie Closs, MD [facility]  02/18/2023, 10:49 AM   *Please note that this is a verbal dictation therefore any spelling or grammatical errors are due to the "Dragon Medical One" system interpretation.  Please page via Amion and do not message via secure chat for urgent patient care matters. Secure chat can be used for non urgent patient care matters.  How to contact the [facility] Attending or Consulting provider 7A - 7P or covering provider during after hours 7P -7A, for this patient?  Check the care team in [name] and look for a) attending/consulting [facility] provider listed and b) the [facility] team listed. Page or secure chat 7A-7P. Log into [email or web address] and use [facility]'s universal password to access. If you do not have the password, please contact the hospital operator. Locate the [facility] provider you are looking for under [facility] and page to a number that you can be directly reached. If you still have difficulty reaching the provider, please page the [facility] (Director on Call) for the Hospitalists listed on amion for assistance.

## 2023-02-18 NOTE — Plan of Care (Signed)
  Problem: Activity: Goal: Ability to tolerate increased activity will improve Outcome: Progressing   Problem: Health Behavior/Discharge Planning: Goal: Ability to manage health-related needs will improve Outcome: Progressing   Problem: Clinical Measurements: Goal: Will remain free from infection Outcome: Progressing   Problem: Nutrition: Goal: Adequate nutrition will be maintained Outcome: Progressing   Problem: Coping: Goal: Level of anxiety will decrease Outcome: Progressing   Problem: Elimination: Goal: Will not experience complications related to urinary retention Outcome: Progressing   Problem: Elimination: Goal: Will not experience complications related to bowel motility Outcome: Not Progressing Note: Constipation

## 2023-02-18 NOTE — Progress Notes (Signed)
ANTICOAGULATION CONSULT NOTE  Pharmacy Consult for heparin Indication: chest pain/ACS  Allergies  Allergen Reactions   Iohexol      Desc: pt had syncopal episode with nausea post IV CM late 1990's,  pt has had prednisone prep with heart caths x 2 without problem  kdean 04/16/07, Onset Date: [id number]    Ticlid [Ticlopidine Hcl] Nausea And Vomiting   Jardiance [Empagliflozin] Other (See Comments)    Recurrent UTIs   Metformin And Related Diarrhea   Codeine Nausea And Vomiting and Palpitations    Patient Measurements: Height: 5\' 6"  (167.6 cm) Weight: 65.1 kg (143 lb 8 oz) IBW/kg (Calculated) : 59.3 HEPARIN DW (KG): 65.8   Vital Signs: Temp: 98.4 F (36.9 C) (05/27 0419) Temp Source: Oral (05/27 0419) BP: 138/68 (05/27 0419) Pulse Rate: 72 (05/27 0419)  Labs: Recent Labs    02/16/23 0057 02/16/23 0952 02/17/23 0129 02/18/23 0125  HGB 10.8*  --  11.3* 12.3  HCT 32.2*  --  32.4* 35.5*  PLT 131*  --  134* 126*  HEPARINUNFRC 0.71* 0.48 0.33 0.61    Estimated Creatinine Clearance: 44 mL/min (A) (by C-G formula based on SCr of 1.05 mg/dL (H)).   Medical History: Past Medical History:  Diagnosis Date   Anxiety    CAD (coronary artery disease)    DES to circumflex 02/2007, BMS to LAD and PTCA diagonal 03/2007   Carotid artery plaque    Mild   Cataract    Depression    Diverticulitis, colon    Elevated d-dimer 01/08/2014   Essential hypertension, benign    GERD (gastroesophageal reflux disease)    H/O hiatal hernia    HLD (hyperlipidemia)    IDDM (insulin dependent diabetes mellitus)    Migraine    "used to have them really bad; don't have them anymore" (01/07/2014)   MS (multiple sclerosis) (HCC)    Not confirmed   PAT (paroxysmal atrial tachycardia)    Prolapse of uterus    PVD (peripheral vascular disease) (HCC)    TIA (transient ischemic attack) 1980's    Assessment: 72 year old female presents to ED with chest pain, nausea, and vomiting. Patient does have  history of CAD s/p stents. Pharmacy consulted to dose heparin for ACS/chest pain.   Patient is now s/p cath with noted LAD disease with plan for CABG 5/28. Heparin level 0.61 on drip rate 800 units/hr therapeutic. Heparin level continues to be within range. Hgb 12.3 and plt 126. No s/sx bleeding  noted.   Goal of Therapy:  Heparin level 0.3-0.7 units/ml Monitor platelets by anticoagulation protocol: Yes   Plan:  Continue heparin infusion at 800 units/hr Check heparin level and CBC daily Continue to monitor H&H and platelets F/u [facility] plan s/p CABG 5/28   Thank you for allowing pharmacy to be a part of this patient's care.  Jerry Caras, PharmD PGY1 Pharmacy Resident   02/18/2023 7:55 AM

## 2023-02-18 NOTE — Progress Notes (Signed)
Progress Note  Patient Name: [name] Date of Encounter: 02/18/2023  Primary Cardiologist:   Rollene Rotunda, MD   Subjective   She is still having some chest pain.    Inpatient Medications    Scheduled Meds:  amLODipine  2.5 mg Oral Daily   aspirin EC  81 mg Oral Daily   atenolol  100 mg Oral BID   docusate sodium  100 mg Oral BID   [START ON 02/19/2023] epinephrine  0-10 mcg/min Intravenous To OR   furosemide  40 mg Oral Daily   [START ON 02/19/2023] heparin sodium (porcine) 2,500 Units, papaverine 30 mg in electrolyte-A (PLASMALYTE-A PH 7.4) 500 mL irrigation   Irrigation To OR   insulin aspart  0-15 Units Subcutaneous TID WC   insulin aspart  0-5 Units Subcutaneous QHS   insulin glargine-yfgn  50 Units Subcutaneous Daily   [START ON 02/19/2023] insulin   Intravenous To OR   isosorbide mononitrate  120 mg Oral Daily   [START ON 02/19/2023] [facility] Blood Cardioplegia vial (lidocaine/magnesium/mannitol 0.26g-4g-6.4g)   Intracoronary To OR   lisinopril  10 mg Oral Daily   [START ON 02/19/2023] phenylephrine  30-200 mcg/min Intravenous To OR   polyethylene glycol  17 g Oral Daily   potassium chloride  10 mEq Oral BID   [START ON 02/19/2023] potassium chloride  80 mEq Other To OR   ranolazine  500 mg Oral BID   rosuvastatin  20 mg Oral Daily   sodium chloride flush  3 mL Intravenous Q12H   [START ON 02/19/2023] tranexamic acid  15 mg/kg Intravenous To OR   [START ON 02/19/2023] tranexamic acid  2 mg/kg Intracatheter To OR   Continuous Infusions:  sodium chloride Stopped (02/14/23 0530)   sodium chloride     [START ON 02/19/2023]  ceFAZolin (ANCEF) IV     [START ON 02/19/2023]  ceFAZolin (ANCEF) IV     [START ON 02/19/2023] dexmedetomidine     [START ON 02/19/2023] heparin 30,000 units/NS 1000 mL solution for CELLSAVER     heparin 800 Units/hr (02/17/23 2202)   [START ON 02/19/2023] milrinone     [START ON 02/19/2023] nitroGLYCERIN     [START ON 02/19/2023] norepinephrine      [START ON 02/19/2023] tranexamic acid (CYKLOKAPRON) 2,500 mg in sodium chloride 0.9 % 250 mL (10 mg/mL) infusion     [START ON 02/19/2023] vancomycin     PRN Meds: sodium chloride, acetaminophen, nitroGLYCERIN, ondansetron (ZOFRAN) IV, rOPINIRole, sodium chloride flush, traZODone   Vital Signs    Vitals:   02/17/23 1601 02/17/23 1930 02/18/23 0041 02/18/23 0419  BP: 137/80 (!) 145/78 (!) 147/73 138/68  Pulse: 71 73 70 72  Resp: 14 20  20   Temp: 98.6 F (37 C) 98.9 F (37.2 C) 98.3 F (36.8 C) 98.4 F (36.9 C)  TempSrc: Oral Oral Oral Oral  SpO2: 97% 95% 97% 97%  Weight:    65.1 kg  Height:        Intake/Output Summary (Last 24 hours) at 02/18/2023 0811 Last data filed at 02/17/2023 1754 Gross per 24 hour  Intake 630 ml  Output 1000 ml  Net -370 ml   Filed Weights   02/16/23 0423 02/17/23 0421 02/18/23 0419  Weight: 65.8 kg 66.2 kg 65.1 kg    Telemetry    NSR - Personally Reviewed  ECG    NA - Personally Reviewed  Physical Exam   GEN: No acute distress.   Neck: No  JVD Cardiac: RRR,  no murmurs, rubs, or gallops.  Respiratory: Clear to auscultation bilaterally. GI: Soft, nontender, non-distended  MS: No edema; No deformity. Neuro:  Nonfocal  Psych: Normal affect   Labs    Chemistry Recent Labs  Lab 02/13/23 1001 02/14/23 0113 02/15/23 0059  NA 134* 136 135  K 3.4* 2.9* 4.0  CL 92* 96* 100  CO2 25 29 24   GLUCOSE 354* 105* 186*  BUN 19 17 16   CREATININE 1.24* 1.14* 1.05*  CALCIUM 9.0 9.1 9.4  PROT  --  6.7  --   ALBUMIN  --  3.2*  --   AST  --  28  --   ALT  --  14  --   ALKPHOS  --  46  --   BILITOT  --  0.6  --   GFRNONAA 46* 51* 56*  ANIONGAP 17* 11 11     Hematology Recent Labs  Lab 02/16/23 0057 02/17/23 0129 02/18/23 0125  WBC 8.4 7.1 9.2  RBC 3.71* 3.80* 4.00  HGB 10.8* 11.3* 12.3  HCT 32.2* 32.4* 35.5*  MCV 86.8 85.3 88.8  MCH 29.1 29.7 30.8  MCHC 33.5 34.9 34.6  RDW 13.5 13.8 13.7  PLT 131* 134* 126*    Cardiac  EnzymesNo results for input(s): "TROPONINI" in the last 168 hours. No results for input(s): "TROPIPOC" in the last 168 hours.   BNPNo results for input(s): "BNP", "PROBNP" in the last 168 hours.   DDimer No results for input(s): "DDIMER" in the last 168 hours.   Radiology    No results found.  Cardiac Studies   Echo   1. Left ventricular ejection fraction, by estimation, is 60 to 65%. The  left ventricle has normal function. The left ventricle has no regional  wall motion abnormalities. There is mild concentric left ventricular  hypertrophy. Left ventricular diastolic  parameters are consistent with Grade I diastolic dysfunction (impaired  relaxation).   2. Peak RV-RA gradient 30 mmHg. IVC not visualized. Right ventricular  systolic function is normal. The right ventricular size is mildly  enlarged.   3. The mitral valve is abnormal. Trivial mitral valve regurgitation. Mild  mitral stenosis. The mean mitral valve gradient is 5.0 mmHg. Moderate  mitral annular calcification.   4. The aortic valve is tricuspid. There is mild calcification of the  aortic valve. Aortic valve regurgitation is not visualized. No aortic  stenosis is present.   Patient Profile     72 y.o. female history of CAD with unstable angina   Assessment & Plan    Unstable angina:  CABG planned for 02/19/23.  Continue heparin.    Mild mitral stenosis:  Medical management  Hypertensive urgency:  BP is mildly elevated.  No change in therapy but will follow post op and likely will need med titration. .   Dyslipidemia:  LDL is at target .  Continue current therapy.     DM:  A1c is 7.8.    Plan per primary team.     For questions or updates, please contact [facility] Please consult [email or web address] for contact info under Cardiology/STEMI.   Signed, Rollene Rotunda, MD  02/18/2023, 8:11 AM

## 2023-02-19 ENCOUNTER — Inpatient Hospital Stay (HOSPITAL_COMMUNITY): Payer: PPO

## 2023-02-19 ENCOUNTER — Other Ambulatory Visit: Payer: Self-pay

## 2023-02-19 ENCOUNTER — Inpatient Hospital Stay (HOSPITAL_COMMUNITY)
Admission: EM | Disposition: A | Discharge status: Home / Self Care | Payer: Self-pay | Source: Home / Self Care | Attending: Family Medicine

## 2023-02-19 ENCOUNTER — Encounter (HOSPITAL_COMMUNITY): Payer: Self-pay | Admitting: Internal Medicine

## 2023-02-19 DIAGNOSIS — I110 Hypertensive heart disease with heart failure: Secondary | ICD-10-CM

## 2023-02-19 DIAGNOSIS — Z951 Presence of aortocoronary bypass graft: Secondary | ICD-10-CM | POA: Diagnosis not present

## 2023-02-19 DIAGNOSIS — I2510 Atherosclerotic heart disease of native coronary artery without angina pectoris: Secondary | ICD-10-CM | POA: Diagnosis not present

## 2023-02-19 DIAGNOSIS — Z9911 Dependence on respirator [ventilator] status: Secondary | ICD-10-CM | POA: Diagnosis not present

## 2023-02-19 DIAGNOSIS — I509 Heart failure, unspecified: Secondary | ICD-10-CM | POA: Diagnosis not present

## 2023-02-19 DIAGNOSIS — E1151 Type 2 diabetes mellitus with diabetic peripheral angiopathy without gangrene: Secondary | ICD-10-CM

## 2023-02-19 DIAGNOSIS — I25119 Atherosclerotic heart disease of native coronary artery with unspecified angina pectoris: Secondary | ICD-10-CM

## 2023-02-19 DIAGNOSIS — J9601 Acute respiratory failure with hypoxia: Secondary | ICD-10-CM

## 2023-02-19 DIAGNOSIS — Z794 Long term (current) use of insulin: Secondary | ICD-10-CM

## 2023-02-19 DIAGNOSIS — I200 Unstable angina: Secondary | ICD-10-CM | POA: Diagnosis not present

## 2023-02-19 HISTORY — PX: CORONARY ARTERY BYPASS GRAFT: SHX141

## 2023-02-19 HISTORY — PX: TEE WITHOUT CARDIOVERSION: SHX5443

## 2023-02-19 LAB — POCT I-STAT 7, (LYTES, BLD GAS, ICA,H+H)
Acid-base deficit: 1 mmol/L (ref 0.0–2.0)
Acid-base deficit: 1 mmol/L (ref 0.0–2.0)
Acid-base deficit: 3 mmol/L — ABNORMAL HIGH (ref 0.0–2.0)
Acid-base deficit: 5 mmol/L — ABNORMAL HIGH (ref 0.0–2.0)
Acid-base deficit: 6 mmol/L — ABNORMAL HIGH (ref 0.0–2.0)
Bicarbonate: 22.1 mmol/L (ref 20.0–28.0)
Bicarbonate: 22.7 mmol/L (ref 20.0–28.0)
Bicarbonate: 19.5 mmol/L — ABNORMAL LOW (ref 20.0–28.0)
Bicarbonate: 16.7 mmol/L — ABNORMAL LOW (ref 20.0–28.0)
Bicarbonate: 18.8 mmol/L — ABNORMAL LOW (ref 20.0–28.0)
Calcium, Ion: 1.16 mmol/L (ref 1.15–1.40)
Calcium, Ion: 1.23 mmol/L (ref 1.15–1.40)
Calcium, Ion: 1.13 mmol/L — ABNORMAL LOW (ref 1.15–1.40)
Calcium, Ion: 1.14 mmol/L — ABNORMAL LOW (ref 1.15–1.40)
Calcium, Ion: 1.21 mmol/L (ref 1.15–1.40)
HCT: 30 % — ABNORMAL LOW (ref 36.0–46.0)
HCT: 31 % — ABNORMAL LOW (ref 36.0–46.0)
HCT: 27 % — ABNORMAL LOW (ref 36.0–46.0)
HCT: 27 % — ABNORMAL LOW (ref 36.0–46.0)
HCT: 28 % — ABNORMAL LOW (ref 36.0–46.0)
Hemoglobin: 10.2 g/dL — ABNORMAL LOW (ref 12.0–15.0)
Hemoglobin: 10.5 g/dL — ABNORMAL LOW (ref 12.0–15.0)
Hemoglobin: 9.2 g/dL — ABNORMAL LOW (ref 12.0–15.0)
Hemoglobin: 9.2 g/dL — ABNORMAL LOW (ref 12.0–15.0)
Hemoglobin: 9.5 g/dL — ABNORMAL LOW (ref 12.0–15.0)
O2 Saturation: 100 %
O2 Saturation: 100 %
O2 Saturation: 100 %
O2 Saturation: 100 %
O2 Saturation: 100 %
Patient temperature: 92.1
Patient temperature: 98
Patient temperature: 97.9
Potassium: 3.5 mmol/L (ref 3.5–5.1)
Potassium: 3.3 mmol/L — ABNORMAL LOW (ref 3.5–5.1)
Potassium: 2.6 mmol/L — CL (ref 3.5–5.1)
Potassium: 3.3 mmol/L — ABNORMAL LOW (ref 3.5–5.1)
Potassium: 3.7 mmol/L (ref 3.5–5.1)
Sodium: 138 mmol/L (ref 135–145)
Sodium: 137 mmol/L (ref 135–145)
Sodium: 140 mmol/L (ref 135–145)
Sodium: 139 mmol/L (ref 135–145)
Sodium: 138 mmol/L (ref 135–145)
TCO2: 23 mmol/L (ref 22–32)
TCO2: 24 mmol/L (ref 22–32)
TCO2: 20 mmol/L — ABNORMAL LOW (ref 22–32)
TCO2: 17 mmol/L — ABNORMAL LOW (ref 22–32)
TCO2: 20 mmol/L — ABNORMAL LOW (ref 22–32)
pCO2 arterial: 31.4 mmHg — ABNORMAL LOW (ref 32–48)
pCO2 arterial: 34.1 mmHg (ref 32–48)
pCO2 arterial: 21.6 mmHg — ABNORMAL LOW (ref 32–48)
pCO2 arterial: 20.1 mmHg — ABNORMAL LOW (ref 32–48)
pCO2 arterial: 33.1 mmHg (ref 32–48)
pH, Arterial: 7.455 — ABNORMAL HIGH (ref 7.35–7.45)
pH, Arterial: 7.431 (ref 7.35–7.45)
pH, Arterial: 7.552 — ABNORMAL HIGH (ref 7.35–7.45)
pH, Arterial: 7.528 — ABNORMAL HIGH (ref 7.35–7.45)
pH, Arterial: 7.361 (ref 7.35–7.45)
pO2, Arterial: 338 mmHg — ABNORMAL HIGH (ref 83–108)
pO2, Arterial: 336 mmHg — ABNORMAL HIGH (ref 83–108)
pO2, Arterial: 235 mmHg — ABNORMAL HIGH (ref 83–108)
pO2, Arterial: 210 mmHg — ABNORMAL HIGH (ref 83–108)
pO2, Arterial: 189 mmHg — ABNORMAL HIGH (ref 83–108)

## 2023-02-19 LAB — BASIC METABOLIC PANEL
Anion gap: 16 — ABNORMAL HIGH (ref 5–15)
BUN: 14 mg/dL (ref 8–23)
CO2: 20 mmol/L — ABNORMAL LOW (ref 22–32)
Calcium: 9.2 mg/dL (ref 8.9–10.3)
Chloride: 100 mmol/L (ref 98–111)
Creatinine, Ser: 1.27 mg/dL — ABNORMAL HIGH (ref 0.44–1.00)
GFR, Estimated: 44 mL/min — ABNORMAL LOW (ref >60)
Glucose, Bld: 109 mg/dL — ABNORMAL HIGH (ref 70–99)
Potassium: 3.5 mmol/L (ref 3.5–5.1)
Sodium: 136 mmol/L (ref 135–145)

## 2023-02-19 LAB — CBC
HCT: 31 % — ABNORMAL LOW (ref 36.0–46.0)
HCT: 29.1 % — ABNORMAL LOW (ref 36.0–46.0)
Hemoglobin: 10.7 g/dL — ABNORMAL LOW (ref 12.0–15.0)
Hemoglobin: 10.2 g/dL — ABNORMAL LOW (ref 12.0–15.0)
MCH: 30.4 pg (ref 26.0–34.0)
MCH: 30.8 pg (ref 26.0–34.0)
MCHC: 34.5 g/dL (ref 30.0–36.0)
MCHC: 35.1 g/dL (ref 30.0–36.0)
MCV: 88.1 fL (ref 80.0–100.0)
MCV: 87.9 fL (ref 80.0–100.0)
Platelets: 147 10*3/uL — ABNORMAL LOW (ref 150–400)
Platelets: 141 10*3/uL — ABNORMAL LOW (ref 150–400)
RBC: 3.52 MIL/uL — ABNORMAL LOW (ref 3.87–5.11)
RBC: 3.31 MIL/uL — ABNORMAL LOW (ref 3.87–5.11)
RDW: 14.1 % (ref 11.5–15.5)
RDW: 14 % (ref 11.5–15.5)
WBC: 9.7 10*3/uL (ref 4.0–10.5)
WBC: 12 10*3/uL — ABNORMAL HIGH (ref 4.0–10.5)
nRBC: 0 % (ref 0.0–0.2)
nRBC: 0 % (ref 0.0–0.2)

## 2023-02-19 LAB — GLUCOSE, CAPILLARY
Glucose-Capillary: 111 mg/dL — ABNORMAL HIGH (ref 70–99)
Glucose-Capillary: 92 mg/dL (ref 70–99)
Glucose-Capillary: 88 mg/dL (ref 70–99)
Glucose-Capillary: 110 mg/dL — ABNORMAL HIGH (ref 70–99)
Glucose-Capillary: 104 mg/dL — ABNORMAL HIGH (ref 70–99)
Glucose-Capillary: 93 mg/dL (ref 70–99)
Glucose-Capillary: 111 mg/dL — ABNORMAL HIGH (ref 70–99)
Glucose-Capillary: 95 mg/dL (ref 70–99)
Glucose-Capillary: 97 mg/dL (ref 70–99)
Glucose-Capillary: 98 mg/dL (ref 70–99)
Glucose-Capillary: 123 mg/dL — ABNORMAL HIGH (ref 70–99)
Glucose-Capillary: 118 mg/dL — ABNORMAL HIGH (ref 70–99)
Glucose-Capillary: 139 mg/dL — ABNORMAL HIGH (ref 70–99)

## 2023-02-19 LAB — POCT I-STAT, CHEM 8
BUN: 11 mg/dL (ref 8–23)
BUN: 13 mg/dL (ref 8–23)
Calcium, Ion: 1.17 mmol/L (ref 1.15–1.40)
Calcium, Ion: 1.24 mmol/L (ref 1.15–1.40)
Chloride: 104 mmol/L (ref 98–111)
Chloride: 100 mmol/L (ref 98–111)
Creatinine, Ser: 1 mg/dL (ref 0.44–1.00)
Creatinine, Ser: 1.2 mg/dL — ABNORMAL HIGH (ref 0.44–1.00)
Glucose, Bld: 122 mg/dL — ABNORMAL HIGH (ref 70–99)
Glucose, Bld: 127 mg/dL — ABNORMAL HIGH (ref 70–99)
HCT: 29 % — ABNORMAL LOW (ref 36.0–46.0)
HCT: 30 % — ABNORMAL LOW (ref 36.0–46.0)
Hemoglobin: 9.9 g/dL — ABNORMAL LOW (ref 12.0–15.0)
Hemoglobin: 10.2 g/dL — ABNORMAL LOW (ref 12.0–15.0)
Potassium: 3.5 mmol/L (ref 3.5–5.1)
Potassium: 3.3 mmol/L — ABNORMAL LOW (ref 3.5–5.1)
Sodium: 138 mmol/L (ref 135–145)
Sodium: 137 mmol/L (ref 135–145)
TCO2: 27 mmol/L (ref 22–32)
TCO2: 24 mmol/L (ref 22–32)

## 2023-02-19 LAB — ECHO INTRAOPERATIVE TEE: Height: 66 in

## 2023-02-19 LAB — PROTIME-INR
INR: 1.2 (ref 0.8–1.2)
Prothrombin Time: 15.3 seconds — ABNORMAL HIGH (ref 11.4–15.2)

## 2023-02-19 LAB — APTT: aPTT: 31 seconds (ref 24–36)

## 2023-02-19 LAB — MAGNESIUM
Magnesium: 3.4 mg/dL — ABNORMAL HIGH (ref 1.7–2.4)
Magnesium: 2.8 mg/dL — ABNORMAL HIGH (ref 1.7–2.4)

## 2023-02-19 LAB — SARS CORONAVIRUS 2 (TAT 6-24 HRS): SARS Coronavirus 2: NEGATIVE

## 2023-02-19 SURGERY — OFF PUMP CORONARY ARTERY BYPASS GRAFTING (CABG)
Anesthesia: General | Site: Chest

## 2023-02-19 MED ORDER — LIDOCAINE 2% (20 MG/ML) 5 ML SYRINGE
INTRAMUSCULAR | Status: AC
  Filled 2023-02-19: qty 5

## 2023-02-19 MED ORDER — ACETAMINOPHEN 500 MG PO TABS
1000.0000 mg | ORAL_TABLET | Freq: Four times a day (QID) | ORAL | Status: DC
  Administered 2023-02-20 – 2023-02-23 (×10): 1000 mg via ORAL
  Filled 2023-02-19 (×11): qty 2

## 2023-02-19 MED ORDER — LACTATED RINGERS IV SOLN: INTRAVENOUS | Status: DC

## 2023-02-19 MED ORDER — DEXTROSE 50 % IV SOLN: 0.0000 mL | INTRAVENOUS | Status: DC | PRN

## 2023-02-19 MED ORDER — MIDAZOLAM HCL 2 MG/2ML IJ SOLN: 2.0000 mg | INTRAMUSCULAR | Status: DC | PRN

## 2023-02-19 MED ORDER — PROCHLORPERAZINE EDISYLATE 10 MG/2ML IJ SOLN
5.0000 mg | Freq: Once | INTRAMUSCULAR | Status: AC
  Administered 2023-02-19: 5 mg via INTRAVENOUS
  Filled 2023-02-19: qty 1

## 2023-02-19 MED ORDER — FENTANYL CITRATE PF 50 MCG/ML IJ SOSY
25.0000 ug | PREFILLED_SYRINGE | INTRAMUSCULAR | Status: DC | PRN
  Filled 2023-02-19 (×2): qty 1

## 2023-02-19 MED ORDER — AMIODARONE HCL IN DEXTROSE 360-4.14 MG/200ML-% IV SOLN
60.0000 mg/h | INTRAVENOUS | Status: AC
  Administered 2023-02-19: 60 mg/h via INTRAVENOUS
  Filled 2023-02-19 (×2): qty 200

## 2023-02-19 MED ORDER — SODIUM CHLORIDE 0.9 % IV SOLN: INTRAVENOUS | Status: DC

## 2023-02-19 MED ORDER — HEMOSTATIC AGENTS (NO CHARGE) OPTIME
TOPICAL | Status: DC | PRN
  Administered 2023-02-19 (×2): 1 via TOPICAL

## 2023-02-19 MED ORDER — ONDANSETRON HCL 4 MG/2ML IJ SOLN
INTRAMUSCULAR | Status: AC
  Filled 2023-02-19: qty 2

## 2023-02-19 MED ORDER — LACTATED RINGERS IV SOLN: INTRAVENOUS | Status: DC | PRN

## 2023-02-19 MED ORDER — ALBUMIN HUMAN 5 % IV SOLN
250.0000 mL | INTRAVENOUS | Status: AC | PRN
  Administered 2023-02-19 (×3): 12.5 g via INTRAVENOUS
  Filled 2023-02-19 (×2): qty 250

## 2023-02-19 MED ORDER — PHENYLEPHRINE 80 MCG/ML (10ML) SYRINGE FOR IV PUSH (FOR BLOOD PRESSURE SUPPORT)
PREFILLED_SYRINGE | INTRAVENOUS | Status: AC
  Filled 2023-02-19: qty 10

## 2023-02-19 MED ORDER — ACETAMINOPHEN 650 MG RE SUPP
650.0000 mg | Freq: Once | RECTAL | Status: AC
  Administered 2023-02-19: 650 mg via RECTAL

## 2023-02-19 MED ORDER — HEPARIN SODIUM (PORCINE) 1000 UNIT/ML IJ SOLN
INTRAMUSCULAR | Status: AC
  Filled 2023-02-19: qty 1

## 2023-02-19 MED ORDER — SODIUM CHLORIDE 0.9% FLUSH: 3.0000 mL | INTRAVENOUS | Status: DC | PRN

## 2023-02-19 MED ORDER — PANTOPRAZOLE SODIUM 40 MG PO TBEC
40.0000 mg | DELAYED_RELEASE_TABLET | Freq: Every day | ORAL | Status: DC
  Administered 2023-02-20 – 2023-02-23 (×4): 40 mg via ORAL
  Filled 2023-02-19 (×4): qty 1

## 2023-02-19 MED ORDER — POTASSIUM CHLORIDE 10 MEQ/50ML IV SOLN
10.0000 meq | INTRAVENOUS | Status: AC
  Administered 2023-02-19 (×3): 10 meq via INTRAVENOUS
  Filled 2023-02-19 (×3): qty 50

## 2023-02-19 MED ORDER — ASPIRIN 81 MG PO CHEW
324.0000 mg | CHEWABLE_TABLET | Freq: Every day | ORAL | Status: DC
  Administered 2023-02-22: 324 mg
  Filled 2023-02-19: qty 4

## 2023-02-19 MED ORDER — MIDAZOLAM HCL (PF) 10 MG/2ML IJ SOLN
INTRAMUSCULAR | Status: AC
  Filled 2023-02-19: qty 2

## 2023-02-19 MED ORDER — ASPIRIN 325 MG PO TBEC
325.0000 mg | DELAYED_RELEASE_TABLET | Freq: Every day | ORAL | Status: DC
  Administered 2023-02-20 – 2023-02-23 (×3): 325 mg via ORAL
  Filled 2023-02-19 (×4): qty 1

## 2023-02-19 MED ORDER — NOREPINEPHRINE 4 MG/250ML-% IV SOLN
0.0000 ug/min | INTRAVENOUS | Status: DC
  Administered 2023-02-19: 1 ug/min via INTRAVENOUS

## 2023-02-19 MED ORDER — FENTANYL CITRATE PF 50 MCG/ML IJ SOSY: 25.0000 ug | PREFILLED_SYRINGE | INTRAMUSCULAR | Status: DC | PRN

## 2023-02-19 MED ORDER — BISACODYL 10 MG RE SUPP
10.0000 mg | Freq: Every day | RECTAL | Status: DC
  Administered 2023-02-21: 10 mg via RECTAL
  Filled 2023-02-19: qty 1

## 2023-02-19 MED ORDER — AMIODARONE HCL IN DEXTROSE 360-4.14 MG/200ML-% IV SOLN
30.0000 mg/h | INTRAVENOUS | Status: DC
  Administered 2023-02-21: 30 mg/h via INTRAVENOUS
  Filled 2023-02-19 (×2): qty 200

## 2023-02-19 MED ORDER — CHLORHEXIDINE GLUCONATE CLOTH 2 % EX PADS
6.0000 | MEDICATED_PAD | Freq: Every day | CUTANEOUS | Status: DC
  Administered 2023-02-19 – 2023-02-20 (×2): 6 via TOPICAL

## 2023-02-19 MED ORDER — MIDAZOLAM HCL (PF) 5 MG/ML IJ SOLN
INTRAMUSCULAR | Status: DC | PRN
  Administered 2023-02-19: 3 mg via INTRAVENOUS
  Administered 2023-02-19: 1 mg via INTRAVENOUS

## 2023-02-19 MED ORDER — ONDANSETRON HCL 4 MG/2ML IJ SOLN
INTRAMUSCULAR | Status: DC | PRN
  Administered 2023-02-19: 4 mg via INTRAVENOUS

## 2023-02-19 MED ORDER — NITROGLYCERIN IN D5W 200-5 MCG/ML-% IV SOLN
0.0000 ug/min | INTRAVENOUS | Status: DC
  Administered 2023-02-20: 5 ug/min via INTRAVENOUS
  Administered 2023-02-20: 10 ug/min via INTRAVENOUS

## 2023-02-19 MED ORDER — CEFAZOLIN SODIUM-DEXTROSE 2-4 GM/100ML-% IV SOLN
2.0000 g | Freq: Three times a day (TID) | INTRAVENOUS | Status: AC
  Administered 2023-02-19 – 2023-02-21 (×6): 2 g via INTRAVENOUS
  Filled 2023-02-19 (×6): qty 100

## 2023-02-19 MED ORDER — HEPARIN SODIUM (PORCINE) 1000 UNIT/ML IJ SOLN
INTRAMUSCULAR | Status: DC | PRN
  Administered 2023-02-19: 10000 [IU] via INTRAVENOUS

## 2023-02-19 MED ORDER — PLASMA-LYTE A IV SOLN
INTRAVENOUS | Status: DC | PRN
  Administered 2023-02-19: 500 mL

## 2023-02-19 MED ORDER — DEXAMETHASONE SODIUM PHOSPHATE 10 MG/ML IJ SOLN
INTRAMUSCULAR | Status: DC | PRN
  Administered 2023-02-19: 10 mg via INTRAVENOUS

## 2023-02-19 MED ORDER — MAGNESIUM SULFATE 2 GM/50ML IV SOLN: 2.0000 g | Freq: Once | INTRAVENOUS | Status: DC

## 2023-02-19 MED ORDER — FENTANYL CITRATE PF 50 MCG/ML IJ SOSY
50.0000 ug | PREFILLED_SYRINGE | INTRAMUSCULAR | Status: DC | PRN
  Administered 2023-02-19 – 2023-02-20 (×3): 50 ug via INTRAVENOUS
  Filled 2023-02-19: qty 1

## 2023-02-19 MED ORDER — FENTANYL CITRATE (PF) 250 MCG/5ML IJ SOLN
INTRAMUSCULAR | Status: AC
  Filled 2023-02-19: qty 5

## 2023-02-19 MED ORDER — MAGNESIUM SULFATE 4 GM/100ML IV SOLN
4.0000 g | Freq: Once | INTRAVENOUS | Status: AC
  Administered 2023-02-19: 4 g via INTRAVENOUS
  Filled 2023-02-19: qty 100

## 2023-02-19 MED ORDER — VANCOMYCIN HCL IN DEXTROSE 1-5 GM/200ML-% IV SOLN
1000.0000 mg | Freq: Once | INTRAVENOUS | Status: AC
  Administered 2023-02-19: 1000 mg via INTRAVENOUS
  Filled 2023-02-19: qty 200

## 2023-02-19 MED ORDER — PHENYLEPHRINE 80 MCG/ML (10ML) SYRINGE FOR IV PUSH (FOR BLOOD PRESSURE SUPPORT)
PREFILLED_SYRINGE | INTRAVENOUS | Status: DC | PRN
  Administered 2023-02-19: 40 ug via INTRAVENOUS
  Administered 2023-02-19: 80 ug via INTRAVENOUS
  Administered 2023-02-19 (×2): 40 ug via INTRAVENOUS
  Administered 2023-02-19 (×3): 80 ug via INTRAVENOUS

## 2023-02-19 MED ORDER — SODIUM CHLORIDE 0.45 % IV SOLN: INTRAVENOUS | Status: DC | PRN

## 2023-02-19 MED ORDER — OXYCODONE HCL 5 MG PO TABS
5.0000 mg | ORAL_TABLET | ORAL | Status: DC | PRN
  Administered 2023-02-21: 5 mg via ORAL
  Filled 2023-02-19: qty 1

## 2023-02-19 MED ORDER — ONDANSETRON HCL 4 MG/2ML IJ SOLN
4.0000 mg | Freq: Four times a day (QID) | INTRAMUSCULAR | Status: DC | PRN
  Administered 2023-02-19: 4 mg via INTRAVENOUS
  Filled 2023-02-19: qty 2

## 2023-02-19 MED ORDER — DEXMEDETOMIDINE HCL IN NACL 400 MCG/100ML IV SOLN: 0.0000 ug/kg/h | INTRAVENOUS | Status: DC

## 2023-02-19 MED ORDER — SODIUM CHLORIDE 0.9 % IV SOLN: 250.0000 mL | INTRAVENOUS | Status: DC

## 2023-02-19 MED ORDER — PROTAMINE SULFATE 10 MG/ML IV SOLN
INTRAVENOUS | Status: DC | PRN
  Administered 2023-02-19: 20 mg via INTRAVENOUS
  Administered 2023-02-19: 30 mg via INTRAVENOUS
  Administered 2023-02-19: 20 mg via INTRAVENOUS
  Administered 2023-02-19: 30 mg via INTRAVENOUS

## 2023-02-19 MED ORDER — INSULIN REGULAR(HUMAN) IN NACL 100-0.9 UT/100ML-% IV SOLN: INTRAVENOUS | Status: DC

## 2023-02-19 MED ORDER — LACTATED RINGERS IV SOLN: 500.0000 mL | Freq: Once | INTRAVENOUS | Status: DC | PRN

## 2023-02-19 MED ORDER — PROPOFOL 10 MG/ML IV BOLUS
INTRAVENOUS | Status: AC
  Filled 2023-02-19: qty 20

## 2023-02-19 MED ORDER — ROCURONIUM BROMIDE 10 MG/ML (PF) SYRINGE
PREFILLED_SYRINGE | INTRAVENOUS | Status: AC
  Filled 2023-02-19: qty 10

## 2023-02-19 MED ORDER — CHLORHEXIDINE GLUCONATE 0.12 % MT SOLN
15.0000 mL | OROMUCOSAL | Status: AC
  Administered 2023-02-19: 15 mL via OROMUCOSAL
  Filled 2023-02-19: qty 15

## 2023-02-19 MED ORDER — PROPOFOL 10 MG/ML IV BOLUS
INTRAVENOUS | Status: DC | PRN
  Administered 2023-02-19: 100 mg via INTRAVENOUS
  Administered 2023-02-19 (×2): 20 mg via INTRAVENOUS

## 2023-02-19 MED ORDER — BISACODYL 5 MG PO TBEC
10.0000 mg | DELAYED_RELEASE_TABLET | Freq: Every day | ORAL | Status: DC
  Administered 2023-02-20 – 2023-02-22 (×2): 10 mg via ORAL
  Filled 2023-02-19 (×3): qty 2

## 2023-02-19 MED ORDER — DEXAMETHASONE SODIUM PHOSPHATE 10 MG/ML IJ SOLN
INTRAMUSCULAR | Status: AC
  Filled 2023-02-19: qty 1

## 2023-02-19 MED ORDER — DOCUSATE SODIUM 100 MG PO CAPS
200.0000 mg | ORAL_CAPSULE | Freq: Every day | ORAL | Status: DC
  Administered 2023-02-20: 200 mg via ORAL
  Filled 2023-02-19: qty 2

## 2023-02-19 MED ORDER — POTASSIUM CHLORIDE 10 MEQ/50ML IV SOLN
10.0000 meq | INTRAVENOUS | Status: AC
  Administered 2023-02-19 (×3): 10 meq via INTRAVENOUS

## 2023-02-19 MED ORDER — ACETAMINOPHEN 160 MG/5ML PO SOLN: 650.0000 mg | Freq: Once | ORAL | Status: AC

## 2023-02-19 MED ORDER — METOPROLOL TARTRATE 12.5 MG HALF TABLET: 12.5000 mg | ORAL_TABLET | Freq: Two times a day (BID) | ORAL | Status: DC

## 2023-02-19 MED ORDER — PROTAMINE SULFATE 10 MG/ML IV SOLN
INTRAVENOUS | Status: AC
  Filled 2023-02-19: qty 10

## 2023-02-19 MED ORDER — 0.9 % SODIUM CHLORIDE (POUR BTL) OPTIME
TOPICAL | Status: DC | PRN
  Administered 2023-02-19: 4000 mL

## 2023-02-19 MED ORDER — ACETAMINOPHEN 160 MG/5ML PO SOLN: 1000.0000 mg | Freq: Four times a day (QID) | ORAL | Status: DC

## 2023-02-19 MED ORDER — FENTANYL CITRATE (PF) 250 MCG/5ML IJ SOLN
INTRAMUSCULAR | Status: DC | PRN
  Administered 2023-02-19: 50 ug via INTRAVENOUS
  Administered 2023-02-19 (×2): 100 ug via INTRAVENOUS
  Administered 2023-02-19: 50 ug via INTRAVENOUS
  Administered 2023-02-19 (×2): 100 ug via INTRAVENOUS
  Administered 2023-02-19: 150 ug via INTRAVENOUS
  Administered 2023-02-19: 100 ug via INTRAVENOUS

## 2023-02-19 MED ORDER — METOPROLOL TARTRATE 25 MG/10 ML ORAL SUSPENSION: 12.5000 mg | Freq: Two times a day (BID) | ORAL | Status: DC

## 2023-02-19 MED ORDER — SODIUM CHLORIDE 0.9% FLUSH
3.0000 mL | Freq: Two times a day (BID) | INTRAVENOUS | Status: DC
  Administered 2023-02-20 (×2): 3 mL via INTRAVENOUS

## 2023-02-19 MED ORDER — METOPROLOL TARTRATE 5 MG/5ML IV SOLN
2.5000 mg | INTRAVENOUS | Status: DC | PRN
  Administered 2023-02-20: 2.5 mg via INTRAVENOUS
  Filled 2023-02-19: qty 5

## 2023-02-19 MED ORDER — ROCURONIUM BROMIDE 10 MG/ML (PF) SYRINGE
PREFILLED_SYRINGE | INTRAVENOUS | Status: DC | PRN
  Administered 2023-02-19 (×2): 50 mg via INTRAVENOUS

## 2023-02-19 MED ORDER — AMIODARONE LOAD VIA INFUSION
150.0000 mg | Freq: Once | INTRAVENOUS | Status: AC
  Administered 2023-02-19: 150 mg via INTRAVENOUS
  Filled 2023-02-19: qty 83.34

## 2023-02-19 MED ORDER — FAMOTIDINE IN NACL 20-0.9 MG/50ML-% IV SOLN
20.0000 mg | INTRAVENOUS | Status: AC
  Administered 2023-02-19: 20 mg via INTRAVENOUS
  Filled 2023-02-19: qty 50

## 2023-02-19 SURGICAL SUPPLY — 81 items
BAG DECANTER FOR FLEXI CONT (MISCELLANEOUS) ×4 IMPLANT
BLADE CLIPPER SURG (BLADE) ×2 IMPLANT
BLADE STERNUM SYSTEM 6 (BLADE) ×2 IMPLANT
BLOWER MISTER CAL-MED (MISCELLANEOUS) ×2 IMPLANT
BNDG ELASTIC 4X5.8 VLCR STR LF (GAUZE/BANDAGES/DRESSINGS) ×2 IMPLANT
BNDG ELASTIC 6X5.8 VLCR STR LF (GAUZE/BANDAGES/DRESSINGS) ×2 IMPLANT
CABLE SURGICAL S-101-97-12 (CABLE) IMPLANT
CANISTER SUCT 3000ML PPV (MISCELLANEOUS) ×2 IMPLANT
CANNULA NON VENT 20FR 12 (CANNULA) ×2 IMPLANT
CLIP TI MEDIUM 24 (CLIP) IMPLANT
CLIP TI WIDE RED SMALL 24 (CLIP) IMPLANT
CONN ST 1/2X1/2  BEN (MISCELLANEOUS) ×2
CONN ST 1/2X1/2 BEN (MISCELLANEOUS) ×2 IMPLANT
CONTAINER PROTECT SURGISLUSH (MISCELLANEOUS) IMPLANT
DRAIN CHANNEL 19F RND (DRAIN) IMPLANT
DRAPE CARDIOVASCULAR INCISE (DRAPES) ×2
DRAPE INCISE IOBAN 66X45 STRL (DRAPES) ×2 IMPLANT
DRAPE SRG 135X102X78XABS (DRAPES) ×2 IMPLANT
DRAPE WARM FLUID 44X44 (DRAPES) ×2 IMPLANT
DRSG AQUACEL AG ADV 3.5X10 (GAUZE/BANDAGES/DRESSINGS) IMPLANT
DRSG COVADERM 4X14 (GAUZE/BANDAGES/DRESSINGS) ×2 IMPLANT
ELECT BLADE 4.0 EZ CLEAN MEGAD (MISCELLANEOUS) ×2
ELECT REM PT RETURN 9FT ADLT (ELECTROSURGICAL) ×4
ELECTRODE BLDE 4.0 EZ CLN MEGD (MISCELLANEOUS) IMPLANT
ELECTRODE REM PT RTRN 9FT ADLT (ELECTROSURGICAL) ×4 IMPLANT
FELT TEFLON 1X6 (MISCELLANEOUS) ×4 IMPLANT
GAUZE 4X4 16PLY ~~LOC~~+RFID DBL (SPONGE) ×2 IMPLANT
GAUZE SPONGE 4X4 12PLY STRL (GAUZE/BANDAGES/DRESSINGS) ×4 IMPLANT
GLOVE BIOGEL PI IND STRL 7.5 (GLOVE) ×4 IMPLANT
GOWN STRL REUS W/ TWL LRG LVL3 (GOWN DISPOSABLE) ×8 IMPLANT
GOWN STRL REUS W/ TWL XL LVL3 (GOWN DISPOSABLE) ×4 IMPLANT
GOWN STRL REUS W/TWL LRG LVL3 (GOWN DISPOSABLE) ×10
GOWN STRL REUS W/TWL XL LVL3 (GOWN DISPOSABLE) ×2
HEMOSTAT POWDER SURGIFOAM 1G (HEMOSTASIS) ×6 IMPLANT
INSERT SUTURE HOLDER (MISCELLANEOUS) ×2 IMPLANT
KIT BASIN OR (CUSTOM PROCEDURE TRAY) ×2 IMPLANT
KIT SUCTION CATH 14FR (SUCTIONS) ×6 IMPLANT
KIT TURNOVER KIT B (KITS) ×2 IMPLANT
KIT VASOVIEW HEMOPRO 2 VH 4000 (KITS) ×2 IMPLANT
LEAD PACING MYOCARDI (MISCELLANEOUS) ×2 IMPLANT
NS IRRIG 1000ML POUR BTL (IV SOLUTION) ×10 IMPLANT
OFFPUMP STABILIZER SUV (MISCELLANEOUS) ×2 IMPLANT
PACK E OPEN HEART (SUTURE) ×2 IMPLANT
PACK OPEN HEART (CUSTOM PROCEDURE TRAY) ×2 IMPLANT
PAD ARMBOARD 7.5X6 YLW CONV (MISCELLANEOUS) ×4 IMPLANT
PAD ELECT DEFIB RADIOL ZOLL (MISCELLANEOUS) ×2 IMPLANT
PENCIL BUTTON HOLSTER BLD 10FT (ELECTRODE) ×2 IMPLANT
POSITIONER ACROBAT-I OFFPUMP (MISCELLANEOUS) ×2 IMPLANT
POSITIONER HEAD DONUT 9IN (MISCELLANEOUS) ×2 IMPLANT
PUNCH AORTIC ROTATE 4.5MM 8IN (MISCELLANEOUS) IMPLANT
SUPPORT HEART JANKE-BARRON (MISCELLANEOUS) ×2 IMPLANT
SUT BONE WAX W31G (SUTURE) ×2 IMPLANT
SUT ETHIBOND 2 0 SH (SUTURE) ×4
SUT ETHIBOND 2 0 SH 36X2 (SUTURE) IMPLANT
SUT ETHIBOND X763 2 0 SH 1 (SUTURE) IMPLANT
SUT MNCRL AB 3-0 PS2 18 (SUTURE) ×4 IMPLANT
SUT PDS AB 1 CTX 36 (SUTURE) ×4 IMPLANT
SUT PROLENE 3 0 SH DA (SUTURE) ×2 IMPLANT
SUT PROLENE 4 0 RB 1 (SUTURE) ×2
SUT PROLENE 4 0 SH DA (SUTURE) IMPLANT
SUT PROLENE 4-0 RB1 .5 CRCL 36 (SUTURE) IMPLANT
SUT PROLENE 5 0 C 1 36 (SUTURE) ×6 IMPLANT
SUT PROLENE 7 0 BV 1 (SUTURE) IMPLANT
SUT SILK  1 MH (SUTURE) ×6
SUT SILK 1 MH (SUTURE) IMPLANT
SUT SILK 1 TIES 10X30 (SUTURE) IMPLANT
SUT SILK 2 0 SH CR/8 (SUTURE) IMPLANT
SUT SILK 2 0 TIES 10X30 (SUTURE) IMPLANT
SUT SILK 4 0 (SUTURE) ×2
SUT SILK 4-0 18XBRD TIE 12 (SUTURE) IMPLANT
SUT STEEL 6MS V (SUTURE) ×4 IMPLANT
SUT VIC AB 2-0 CTX 27 (SUTURE) IMPLANT
SYSTEM SAHARA CHEST DRAIN ATS (WOUND CARE) ×2 IMPLANT
TAPE CLOTH SURG 4X10 WHT LF (GAUZE/BANDAGES/DRESSINGS) IMPLANT
TAPE PAPER 2X10 WHT MICROPORE (GAUZE/BANDAGES/DRESSINGS) IMPLANT
TAPES RETRACTO (MISCELLANEOUS) ×4 IMPLANT
TOWEL GREEN STERILE (TOWEL DISPOSABLE) ×2 IMPLANT
TOWEL GREEN STERILE FF (TOWEL DISPOSABLE) ×2 IMPLANT
TRAY FOLEY SLVR 16FR TEMP STAT (SET/KITS/TRAYS/PACK) ×2 IMPLANT
UNDERPAD 30X36 HEAVY ABSORB (UNDERPADS AND DIAPERS) ×2 IMPLANT
WATER STERILE IRR 1000ML POUR (IV SOLUTION) ×4 IMPLANT

## 2023-02-19 NOTE — Progress Notes (Signed)
     [address]       [place] [address]             [phone]       No events  Vitals:   02/19/23 0100 02/19/23 0414  BP:  128/66  Pulse:  73  Resp:  17  Temp: 97.9 F (36.6 C) 97.9 F (36.6 C)  SpO2: 94% 98%   Alert NAD Sinus  EWOB  OR today for CABG

## 2023-02-19 NOTE — Transfer of Care (Signed)
Immediate Anesthesia Transfer of Care Note  Patient: [name]  Procedure(s) Performed: OFF PUMP CORONARY ARTERY BYPASS GRAFTING (CABG) X 1 (Chest) TRANSESOPHAGEAL ECHOCARDIOGRAM  Patient Location: ICU  Anesthesia Type:General  Level of Consciousness: sedated and Patient remains intubated per anesthesia plan  Airway & Oxygen Therapy: Patient remains intubated per anesthesia plan and Patient placed on Ventilator (see vital sign flow sheet for setting)  Post-op Assessment: Report given to RN and Post -op Vital signs reviewed and stable  Post vital signs: Reviewed and stable  Last Vitals:  Vitals Value Taken Time  BP 117/86 02/19/23 1038  Temp    Pulse 70 02/19/23 1045  Resp 16 02/19/23 1045  SpO2 100 % 02/19/23 1045  Vitals shown include unvalidated device data.  Last Pain:  Vitals:   02/19/23 0414  TempSrc: Oral  PainSc: Asleep      Patients Stated Pain Goal: 0 (02/14/23 0815)  Complications: No notable events documented.

## 2023-02-19 NOTE — Progress Notes (Signed)
Per rapid wean protocol, RT placed pt in CPAP/PS (10/5) pt did no tolerate. Ventilator alarm read "no patient effort". RT placed pt back in full ventilatory support with a RR of 16. RN notified.

## 2023-02-19 NOTE — Anesthesia Procedure Notes (Addendum)
Procedure Name: Intubation Date/Time: 02/19/2023 7:55 AM  Performed by: Nils Pyle, CRNAPre-anesthesia Checklist: Patient identified, Emergency Drugs available, Suction available and Patient being monitored Patient Re-evaluated:Patient Re-evaluated prior to induction Oxygen Delivery Method: Circle System Utilized Preoxygenation: Pre-oxygenation with 100% oxygen Induction Type: IV induction Ventilation: Mask ventilation without difficulty Laryngoscope Size: Mac and 3 Tube type: Oral Tube size: 8.0 mm Number of attempts: 1 Airway Equipment and Method: Stylet and Oral airway Placement Confirmation: ETT inserted through vocal cords under direct vision, positive ETCO2 and breath sounds checked- equal and bilateral Secured at: 22 cm Tube secured with: Tape Dental Injury: Teeth and Oropharynx as per pre-operative assessment  Comments: Airway by Rande Lawman

## 2023-02-19 NOTE — Consult Note (Signed)
NAME:  [name], MRN:  [id number], DOB:  [birth year], LOS: 7 ADMISSION DATE:  02/12/2023, CONSULTATION DATE:  02/19/23 REFERRING MD:  Cliffton Asters CHIEF COMPLAINT:  Vent management post CABG   History of Present Illness:  Pt is encephelopathic; therefore, this HPI is obtained from chart review. [name] is a 72 y.o. female who has a PMH as below including but not limited to CAD s/p multiple PCI's, DM, HTN, HLD. She developed chest pain 5/21 and was taken to [facility] ED; however, given her cardiac hx, she was later transferred to [facility]. She had cath 5/23 and was found to have severe single vessel disease. She was seen by TCTS and ultimately was taken to OR 5/28 and had off pump CABG x 1 LIMA - LAD.  Post operatively, PCCM called in consultation for assistance with post op management.  Pertinent  Medical History:  has Hyperlipidemia associated with type 2 diabetes mellitus (HCC); Coronary artery disease involving native coronary artery of native heart with unstable angina pectoris (HCC); PALPITATIONS; Type 2 diabetes mellitus with hyperglycemia (HCC); Hypertension associated with diabetes (HCC); Hypokalemia; Gastroesophageal reflux disease without esophagitis; Claudication (HCC); Generalized anxiety disorder; Accelerating angina (HCC); Cataract; Macular degeneration; History of TIA (transient ischemic attack); Uncontrolled type 2 diabetes mellitus with hyperglycemia, with long-term current use of insulin (HCC); History of stroke; Vitamin B12 deficiency; H/O prolonged Q-T interval on ECG; Osteopenia after menopause; Recurrent UTI; Generalized weakness; Recurrent falls; QT prolongation; Depression, recurrent (HCC); Congestive heart failure (HCC); Restless leg syndrome; Insomnia due to medical condition; Incomplete emptying of bladder; Chest pain; Nausea and vomiting; Dyslipidemia; Stage 3a chronic kidney disease (HCC); and Unstable angina (HCC) on their problem list.  Significant Hospital Events: Including  procedures, antibiotic start and stop dates in addition to other pertinent events   5/21 admit 5/23 LHC > single vessel obstructive CAD involving the mid LAD, normal LV function, normal LVEDP. 5/28 > off pump CABG x 1 LIMA - LAD.  Interim History / Subjective:  Sedated on vent. Just arrived from OR.  Objective:  Blood pressure (!) 172/82, pulse 64, temperature 97.9 F (36.6 C), temperature source Oral, resp. rate 16, height 5\' 6"  (1.676 m), weight 64.5 kg, SpO2 100 %.    Vent Mode: SIMV;PSV;PRVC FiO2 (%):  [50 %] 50 % Set Rate:  [16 bmp] 16 bmp Vt Set:  [470 mL] 470 mL PEEP:  [5 cmH20] 5 cmH20 Pressure Support:  [10 cmH20] 10 cmH20 Plateau Pressure:  [15 cmH20] 15 cmH20   Intake/Output Summary (Last 24 hours) at 02/19/2023 1051 Last data filed at 02/19/2023 1004 Gross per 24 hour  Intake 2106.75 ml  Output 690 ml  Net 1416.75 ml   Filed Weights   02/17/23 0421 02/18/23 0419 02/19/23 0414  Weight: 66.2 kg 65.1 kg 64.5 kg    Examination: General: Adult female, critically ill. Neuro: Sedated, not responsive. HEENT: /AT. Sclerae anicteric. ETT in place. Cardiovascular: Chest tubes x 2. RRR, No M/R/G.  Lungs: Respirations even and unlabored.  CTA bilaterally, No W/R/R. Abdomen: BS x 4, soft, NT/ND.  Musculoskeletal: No gross deformities, no edema.  Skin: Intact, warm, no rashes.   Assessment & Plan:    Severe LAD stenosis despite prior PCI's -  now ultimately s/p off pump CABG x 1 LIMA - LAD. - Postoperative care per TCTS. - Continue Levo/Epi/Vaso and wean as able, she is very sensitive to sudden changes in doses and even of body positioning. - ASA. - Albumin/volume per TCTS.  Acute postoperative  ventilator dependence. - Continue full vent support and plan for usual rapid wean per TCTS protocol. - VAP bundle. - Pulmonary hygiene. - CXR intermittently.  Hypokalemia - being repleted. - Continue K repletion. - Follow BMP.  Hx HTN, HLD. - Hold home Atenolol,  Furosemide, Imdur, Lisinopril, Nitro for now. - Continue home Crestor.  Hx DM. - Insulin infusion per [facility]. - Hold home [place], Ozempic.  Hx GAD. - Hold home Trazodone for now.   Best practice (evaluated daily):  Diet/type: NPO DVT prophylaxis: SCD GI prophylaxis: PPI Lines: Central line and Arterial Line Foley:  Yes, and it is still needed Code Status:  full code Last date of multidisciplinary goals of care discussion: None yet.  Labs   CBC: Recent Labs  Lab 02/14/23 0113 02/15/23 0059 02/16/23 0057 02/17/23 0129 02/18/23 0125 02/19/23 0809 02/19/23 0813 02/19/23 0950 02/19/23 0953  WBC 7.4 10.9* 8.4 7.1 9.2  --   --   --   --   HGB 11.2* 11.6* 10.8* 11.3* 12.3 10.2* 9.9* 10.5* 10.2*  HCT 32.1* 33.7* 32.2* 32.4* 35.5* 30.0* 29.0* 31.0* 30.0*  MCV 84.3 85.5 86.8 85.3 88.8  --   --   --   --   PLT 165 165 131* 134* 126*  --   --   --   --     Basic Metabolic Panel: Recent Labs  Lab 02/12/23 1442 02/13/23 1001 02/14/23 0113 02/15/23 0059 02/19/23 0809 02/19/23 0813 02/19/23 0950 02/19/23 0953  NA 134* 134* 136 135 138 138 137 137  K 3.0* 3.4* 2.9* 4.0 3.5 3.5 3.3* 3.3*  CL 95* 92* 96* 100  --  104  --  100  CO2 27 25 29 24   --   --   --   --   GLUCOSE 232* 354* 105* 186*  --  122*  --  127*  BUN 13 19 17 16   --  11  --  13  CREATININE 1.11* 1.24* 1.14* 1.05*  --  1.00  --  1.20*  CALCIUM 9.1 9.0 9.1 9.4  --   --   --   --    GFR: Estimated Creatinine Clearance: 38.5 mL/min (A) (by C-G formula based on SCr of 1.2 mg/dL (H)). Recent Labs  Lab 02/15/23 0059 02/16/23 0057 02/17/23 0129 02/18/23 0125  WBC 10.9* 8.4 7.1 9.2    Liver Function Tests: Recent Labs  Lab 02/14/23 0113  AST 28  ALT 14  ALKPHOS 46  BILITOT 0.6  PROT 6.7  ALBUMIN 3.2*   No results for input(s): "LIPASE", "AMYLASE" in the last 168 hours. No results for input(s): "AMMONIA" in the last 168 hours.  ABG    Component Value Date/Time   PHART 7.431 02/19/2023 0950    PCO2ART 34.1 02/19/2023 0950   PO2ART 336 (H) 02/19/2023 0950   HCO3 22.7 02/19/2023 0950   TCO2 24 02/19/2023 0953   ACIDBASEDEF 1.0 02/19/2023 0950   O2SAT 100 02/19/2023 0950     Coagulation Profile: No results for input(s): "INR", "PROTIME" in the last 168 hours.  Cardiac Enzymes: No results for input(s): "CKTOTAL", "CKMB", "CKMBINDEX", "TROPONINI" in the last 168 hours.  HbA1C: HB A1C (BAYER DCA - WAIVED)  Date/Time Value Ref Range Status  02/04/2023 08:11 AM 7.7 (H) 4.8 - 5.6 % Final    Comment:             Prediabetes: 5.7 - 6.4          Diabetes: >6.4  Glycemic control for adults with diabetes: <7.0   09/20/2022 08:33 AM 9.4 (H) 4.8 - 5.6 % Final    Comment:             Prediabetes: 5.7 - 6.4          Diabetes: >6.4          Glycemic control for adults with diabetes: <7.0    Hgb A1c MFr Bld  Date/Time Value Ref Range Status  02/13/2023 10:01 AM 7.8 (H) 4.8 - 5.6 % Final    Comment:    (NOTE) Pre diabetes:          5.7%-6.4%  Diabetes:              >6.4%  Glycemic control for   <7.0% adults with diabetes   10/05/2022 05:00 AM 8.5 (H) 4.8 - 5.6 % Final    Comment:    (NOTE) Pre diabetes:          5.7%-6.4%  Diabetes:              >6.4%  Glycemic control for   <7.0% adults with diabetes     CBG: Recent Labs  Lab 02/18/23 1133 02/18/23 1629 02/18/23 2125 02/19/23 0552 02/19/23 1053  GLUCAP 233* 136* 100* 111* 92    Review of Systems:   Unable to obtain as pt is encephalopathic.  Past Medical History:  She,  has a past medical history of Anxiety, CAD (coronary artery disease), Carotid artery plaque, Cataract, Depression, Diverticulitis, colon, Elevated d-dimer (01/08/2014), Essential hypertension, benign, GERD (gastroesophageal reflux disease), H/O hiatal hernia, HLD (hyperlipidemia), IDDM (insulin dependent diabetes mellitus), Migraine, MS (multiple sclerosis) (HCC), PAT (paroxysmal atrial tachycardia), Prolapse of uterus, PVD  (peripheral vascular disease) (HCC), and TIA (transient ischemic attack) (1980's).   Surgical History:   Past Surgical History:  Procedure Laterality Date   ABDOMINAL HYSTERECTOMY  1986   ovaries remain - prolaspe uterus    APPENDECTOMY  ~ 1970   BREAST BIOPSY Right 1980's   BREAST LUMPECTOMY Right 1980's   Dr. Luan Moore    CARDIAC CATHETERIZATION  01/07/2014   CHOLECYSTECTOMY  ?1987   COLONOSCOPY  2002   Dr. Anwar--> Severe diverticular changes in the region of the sigmoid and descending colon with scattered diverticular changes throughout the rest of the colon. No polyps, ulcerations. Despite numerous manipulations, the tip of the scope could not be tipped into the cecal area.   COLONOSCOPY  01/10/2012   Procedure: COLONOSCOPY;  Surgeon: Corbin Ade, MD;  Location: [facility];  Service: Endoscopy;  Laterality: N/A;  1:55   CORONARY ANGIOPLASTY WITH STENT PLACEMENT  ~ 1997 X 2   "2 + 1"   CORONARY BALLOON ANGIOPLASTY N/A 10/05/2022   Procedure: CORONARY BALLOON ANGIOPLASTY;  Surgeon: Tonny Bollman, MD;  Location: [facility] INVASIVE CV LAB;  Service: Cardiovascular;  Laterality: N/A;   CORONARY PRESSURE/FFR STUDY N/A 03/08/2017   Procedure: Intravascular Pressure Wire/FFR Study;  Surgeon: Yvonne Kendall, MD;  Location: [facility] INVASIVE CV LAB;  Service: Cardiovascular;  Laterality: N/A;   CORONARY STENT INTERVENTION N/A 03/26/2022   Procedure: CORONARY STENT INTERVENTION;  Surgeon: Runell Gess, MD;  Location: [facility] INVASIVE CV LAB;  Service: Cardiovascular;  Laterality: N/A;   EYE SURGERY Bilateral 2014   cataract   LEFT HEART CATH AND CORONARY ANGIOGRAPHY N/A 03/08/2017   Procedure: Left Heart Cath and Coronary Angiography;  Surgeon: Yvonne Kendall, MD;  Location: [facility] INVASIVE CV LAB;  Service: Cardiovascular;  Laterality: N/A;  LEFT HEART CATH AND CORONARY ANGIOGRAPHY N/A 03/26/2022   Procedure: LEFT HEART CATH AND CORONARY ANGIOGRAPHY;  Surgeon: Runell Gess, MD;  Location: [facility]  INVASIVE CV LAB;  Service: Cardiovascular;  Laterality: N/A;   LEFT HEART CATH AND CORONARY ANGIOGRAPHY N/A 10/05/2022   Procedure: LEFT HEART CATH AND CORONARY ANGIOGRAPHY;  Surgeon: Tonny Bollman, MD;  Location: [facility] INVASIVE CV LAB;  Service: Cardiovascular;  Laterality: N/A;   LEFT HEART CATH AND CORONARY ANGIOGRAPHY N/A 02/14/2023   Procedure: LEFT HEART CATH AND CORONARY ANGIOGRAPHY;  Surgeon: Swaziland, Peter M, MD;  Location: [facility] INVASIVE CV LAB;  Service: Cardiovascular;  Laterality: N/A;   LEFT HEART CATHETERIZATION WITH CORONARY ANGIOGRAM N/A 01/07/2014   Procedure: LEFT HEART CATHETERIZATION WITH CORONARY ANGIOGRAM;  Surgeon: Laurey Morale, MD;  Location: [facility] CATH LAB;  Service: Cardiovascular;  Laterality: N/A;     Social History:   reports that she has never smoked. She has never used smokeless tobacco. She reports that she does not drink alcohol and does not use drugs.   Family History:  Her family history includes Cervical cancer in her daughter; Colon cancer in her paternal aunt; Crohn's disease in her cousin; Diabetes in her brother, daughter, mother, and sister; GER disease in her daughter; Heart attack (age of onset: 64) in her brother; Heart attack (age of onset: 29) in her mother; Heart attack (age of onset: 80) in her father; Heart disease in her brother; Hypertension in her mother.   Allergies Allergies  Allergen Reactions   Iohexol      Desc: pt had syncopal episode with nausea post IV CM late 1990's,  pt has had prednisone prep with heart caths x 2 without problem  kdean 04/16/07, Onset Date: [id number]    Ticlid [Ticlopidine Hcl] Nausea And Vomiting   Jardiance [Empagliflozin] Other (See Comments)    Recurrent UTIs   Metformin And Related Diarrhea   Codeine Nausea And Vomiting and Palpitations     Home Medications  Prior to Admission medications   Medication Sig Start Date End Date Taking? Authorizing Provider  alendronate (FOSAMAX) 70 MG tablet Take 1 tablet (70 mg  total) by mouth every 7 (seven) days. Take with a full glass of water on an empty stomach. 02/06/23  Yes Gabriel Earing, FNP  aspirin 81 MG chewable tablet Chew 1 tablet (81 mg total) by mouth daily. 03/28/22  Yes Rai, Ripudeep K, MD  atenolol (TENORMIN) 50 MG tablet Take 1 tablet (50 mg total) by mouth 2 (two) times daily. 05/25/22  Yes Gabriel Earing, FNP  clopidogrel (PLAVIX) 75 MG tablet Take 1 tablet (75 mg total) by mouth daily with breakfast. 03/28/22  Yes Rai, Ripudeep K, MD  diphenhydrAMINE (BENADRYL) 25 mg capsule Take 25 mg by mouth every 6 (six) hours as needed for allergies.   Yes [provider]  furosemide (LASIX) 40 MG tablet Take 1 tablet (40 mg total) by mouth daily. 04/12/22  Yes Gabriel Earing, FNP  insulin aspart (NOVOLOG FLEXPEN) 100 UNIT/ML FlexPen Inject 36 Units into the skin 3 (three) times daily with meals. Patient taking differently: Inject 25-36 Units into the skin 3 (three) times daily with meals. 10/06/22  Yes Vann, Jessica U, DO  insulin degludec (TRESIBA FLEXTOUCH) 100 UNIT/ML FlexTouch Pen Inject 60 Units into the skin daily.   Yes [provider]  isosorbide mononitrate (IMDUR) 120 MG 24 hr tablet Take 1 tablet (120 mg total) by mouth daily. 10/17/22  Yes Rollene Rotunda, MD  lisinopril (  ZESTRIL) 10 MG tablet Take 1 tablet (10 mg total) by mouth daily. 11/21/22  Yes Hochrein, Fayrene Fearing, MD  nitroGLYCERIN (NITROSTAT) 0.4 MG SL tablet DISSOLVE 1 TABLET UNDER TONGUE FOR CHESTPAIN.MAY REPEAT EVERY 5 MINUTES FOR 3 DOSES.IF NO RELIEF CALL 911 OR GO TO ER 03/27/22  Yes Rai, Ripudeep K, MD  rOPINIRole (REQUIP) 0.5 MG tablet Take 2 tablets (1 mg total) by mouth daily as needed (restless leg). 10/06/22  Yes Vann, Jessica U, DO  rosuvastatin (CRESTOR) 20 MG tablet TAKE ONE (1) TABLET BY MOUTH EVERY DAY 10/29/22  Yes Gabriel Earing, FNP  Semaglutide, 1 MG/DOSE, (OZEMPIC, 1 MG/DOSE,) 2 MG/1.5ML SOPN Inject 2 mg into the skin once a week.   Yes [provider]   traZODone (DESYREL) 100 MG tablet Take 1 tablet (100 mg total) by mouth at bedtime as needed for sleep. 01/15/22  Yes Gabriel Earing, FNP  cephALEXin (KEFLEX) 500 MG capsule Take 1 capsule (500 mg total) by mouth 2 (two) times daily. Patient not taking: Reported on 02/12/2023 02/04/23   Gabriel Earing, FNP  Continuous Blood Gluc Sensor (FREESTYLE LIBRE 3 SENSOR) MISC Place 1 sensor on the skin every 14 days. Use to check glucose continuously. DX:e11.65 11/30/22   Jannifer Rodney A, FNP  potassium chloride (KLOR-CON M) 10 MEQ tablet Take 1 tablet (10 mEq total) by mouth 2 (two) times daily. Patient not taking: Reported on 02/12/2023 06/21/22   Gabriel Earing, FNP     Critical care time: 40 min.   Rutherford Guys, PA - C  Pulmonary & Critical Care Medicine For pager details, please see AMION or use Epic chat  After 1900, please call [facility]INK for cross coverage needs 02/19/2023, 11:03 AM

## 2023-02-19 NOTE — Hospital Course (Addendum)
History of Present Illness:  [name] is a 72 yo female with known history of CAD, DM, and HLD.  She has multiple previous interventions to her LAD and Diagonal vessel dating back to 2008.  In July of 2023 the patient again developed unstable angina and was noted to have circumflex disease again treated with a stent.  She was admitted to the hospital in January of 2024 with unstable angina.  Catheterization at that time showed severe in stent re-stenosis which was treated with angioplasty and medically managed with ASA and Plavix.  The patient developed complaints of headache, nausea, vomiting, and hypertensive emergency.  EMS was called on 02/12/2023 however patient declined transportation to the ED.  She contacted her PCP who recommended patient present to the emergency room.  She presented [facility] hospital and was noted to have mild to flat troponin levels.  Due to her underlying CAD history she was transported to [facility] for further workup.  Hospital Course:  EKG was obtained and showed new anterior infarct.  She developed chest pressure after transportation.  She was noted to have elevated blood glucose level.  Echocardiogram obtained showed normal EF and trivial mitral regurgitation.  Cardiac catheterization showed significant LAD disease.  It was felt coronary bypass grafting would be indicated and TCTS consult was obtained.  She was evaluated by Dr. Cliffton Asters who was in agreement coronary bypass grafting would be indicated.  She had been treated with Plavix and would require washout prior to proceeding.  She was taken to the operating room on 02/19/2023.  She was taken to the operating room and underwent CABG x 1 performed off pump utilizing LIMA to LAD.  She tolerated the procedure without difficulty and was taken to the SICU in stable condition.  She was extubated the evening of surgery.  She was weaned off Amiodarone and NTG drips as tolerated.  She had early evidence of constipation with  associated bilious vomiting and was treated with laxatives.  CXR showed a small apical pneumothorax.  She was transitioned to oral Amiodarone.  She was resumed on home lisinopril for HTN.  Her chest tubes and arterial lines were removed without difficulty.  She was stable for transfer to the progressive care unit on 02/21/2023.  The patient remained in NSR.  She is ambulating independently.  Her surgical incisions are healing without evidence of infection.  She is stable for discharge home today.

## 2023-02-19 NOTE — Progress Notes (Signed)
Went to see this patient in the room but she was already in the OR for scheduled CABG today.  Patient will be transferred to [facility] unit after CABG and will be under cardiothoracic surgery service.  Hospitalist signing off.

## 2023-02-19 NOTE — Op Note (Signed)
[address]       [place] [address]             [phone]                                         02/19/2023    Patient:  Renato Gails Pre-Op Dx: CAD HTN   DM HLP Post-op Dx:  same Procedure: Off pump CABG X 1.  LIMA LAD      Surgeon and Role:      * Garyn Waguespack, Eliezer Lofts, MD - Primary  Anesthesia  general EBL:  Blood Administration: none  Drains: 19 F blake drain: L, mediastinal  Wires: none Counts: correct   Indications: We are asked to see this31 year old female surgical consultation for consideration of coronary artery surgical revascularization.  The patient has known coronary artery disease risk factors including diabetes mellitus and hyperlipidemia.  She has had multiple previous interventions in the past beginning in 2008 when she had a drug eluting stent to left circumflex a bare-metal stent to the LAD and diagonal.  A left heart catheterization done in 2015 showed no obstructive disease in the stents.  A repeat cath in 2018 also showed patency to the stents.  She developed unstable angina in July 2023 and was found to have a mid left circumflex and 95% proximal to mid LAD lesion treated with a drug-eluting stent to the left circumflex distal to the previously placed stent and a LAD stent proximal to the previous stent.  She was again admitted in January 2024 for unstable angina and repeat heart catheterization showed severe in-stent restenosis of the LAD stent treated with an angioplasty and medically managed on aspirin and Plavix.  On 02/12/2023 she called EMS with complaints of persistent headache, nausea, vomiting and hypertensive emergency with systolic blood pressure in the 190s.  She declined ER transport at that time and no EKG was done as she reportedly was not complaining of any chest pain.  She called her primary care physician who advised for her to go to the emergency room and she went to [facility] and was found to have mild to  flat troponin levels.  Due to her significant previous history of interventions she was transferred to [facility] for further management and admission for unstable angina/hypertensive urgency.  She has had significant emotional trauma recently grieving the death of her grandchild.  EKG revealed new anterior infarct findings of undetermined age.  She did develop chest pressure.  Blood sugar was also noted to be poorly controlled with a reading in the 300s.  Her most recent hemoglobin A1c is 7.8.  High-sensitivity troponins have been flat at 20-21.  An echocardiogram has been performed on 5/23 and the full report as described below.  Her EF is 60 to 65% by visual estimation.  There was trivial mitral regurgitation noted.  There is trivial tricuspid valve regurgitation.  There is no significant aortic or pulmonic valve abnormality.  Cardiac catheterization performed today revealed proximal LAD to mid LAD lesion of 99% stenosis.  There is a 50% first diagonal stenosis but other findings were less than 50%.   Findings: Good LIMA.  Small LAD  Operative Technique: After the risks, benefits and alternatives were thoroughly discussed, the patient was brought to the operative theatre.  All invasive lines were placed in pre-op holding.  Anesthesia  was induced, and the patient was prepped and draped in normal sterile fashion.  An appropriate surgical pause was performed, and pre-operative antibiotics were dosed accordingly.  We began with an incisions along the chest for the sternotomy.  In regards to the sternotomy, this was carried down with bovie cautery, and the sternum was divided with a reciprocating saw.  Meticulous hemostasis was obtained.  The left internal thoracic artery was exposed and harvested in in pedicled fashion.  The patient was systemically heparinized, and the artery was divided distally, and placed in a papaverine sponge.    The sternal elevator was removed, and a retractor was placed.  The  pericardium was divided in the midline and fashioned into a cradle with pericardial stitches.   Next, we exposed a good target on the LAD, and fashioned an end to side anastomosis between it and the LITA.    Hemostasis was obtained, and the heparin was reversed with protamine.  Chest tubes and wires were placed, and the sternum was re-approximated with with sternal wires.  The soft tissue and skin were re-approximated wth absorbable suture.    The patient tolerated the procedure without any immediate complications, and was transferred to the ICU in guarded condition.  Laurinda Carreno Keane Scrape

## 2023-02-19 NOTE — Anesthesia Procedure Notes (Signed)
Central Venous Catheter Insertion Performed by: Atilano Median, DO, anesthesiologist Start/End5/28/2024 6:50 AM, 02/19/2023 7:00 AM Patient location: Pre-op. Preanesthetic checklist: patient identified, IV checked, site marked, risks and benefits discussed, surgical consent, monitors and equipment checked, pre-op evaluation, timeout performed and anesthesia consent Position: Trendelenburg Lidocaine 1% used for infiltration and patient sedated Hand hygiene performed , maximum sterile barriers used  and Seldinger technique used Catheter size: 9 Fr Central line was placed.MAC introducer Procedure performed using ultrasound guided technique. Ultrasound Notes:anatomy identified, needle tip was noted to be adjacent to the nerve/plexus identified, no ultrasound evidence of intravascular and/or intraneural injection and image(s) printed for medical record Attempts: 1 Following insertion, line sutured and dressing applied. Post procedure assessment: blood return through all ports, free fluid flow and no air  Patient tolerated the procedure well with no immediate complications. Additional procedure comments: Triple lumen placed through introducer port, all withdrew blood and flushed appropriately. Marland Kitchen

## 2023-02-19 NOTE — Procedures (Signed)
Extubation Procedure Note  Patient Details:   Name: [name] DOB: 1951/02/16 MRN: [id number]   Airway Documentation:    Vent end date: 02/19/23 Vent end time: 1739   Evaluation  O2 sats: stable throughout Complications: No apparent complications Patient did tolerate procedure well. Bilateral Breath Sounds: Clear, Diminished, Expiratory wheezes   Yes,  Per protocol, RT extubated pt to [place]. Prior to extubation, pt did have a positive cuff leak. NIF= -30 VC= 1.09L. Pt tolerated well. No stridor noted. VSS and pt was able to state her name.  Megan Mans 02/19/2023, 6:41 PM

## 2023-02-19 NOTE — Progress Notes (Signed)
Doing well post extubation other than 2 small episodes of vomiting- bilious. No benefit from zofran. Qtc . One dose of compazine.  Also having Afib with HR in low 100s.   Plan: Amiodarone; magnesium level had been increased, will recheck before giving more mag Repeat EKG in about 4 hours. If Qtc prolonged, will need NGT for gastric decompression rather than more anti-emetics.  D/w Dr Cliffton Asters.  All calls overnight to TCTS.  Steffanie Dunn, DO 02/19/23 7:26 PM [facility]  For contact information, see Amion. If no response to pager, please call PCCM consult pager. After hours, 7PM- 7AM, please call Elink.

## 2023-02-19 NOTE — Anesthesia Preprocedure Evaluation (Addendum)
Anesthesia Evaluation  Patient identified by MRN, date of birth, ID band Patient awake    Reviewed: Allergy & Precautions, NPO status , Patient's Chart, lab work & pertinent test results  Airway Mallampati: II  TM Distance: >3 FB Neck ROM: Full    Dental  (+) Edentulous Upper, Edentulous Lower   Pulmonary neg pulmonary ROS   Pulmonary exam normal        Cardiovascular hypertension, Pt. on medications and Pt. on home beta blockers + angina  + CAD, + Peripheral Vascular Disease and +CHF   Rhythm:Regular Rate:Normal  ECHO:  1. Left ventricular ejection fraction, by estimation, is 60 to 65%. The  left ventricle has normal function. The left ventricle has no regional  wall motion abnormalities. There is mild concentric left ventricular  hypertrophy. Left ventricular diastolic  parameters are consistent with Grade I diastolic dysfunction (impaired  relaxation).   2. Peak RV-RA gradient 30 mmHg. IVC not visualized. Right ventricular  systolic function is normal. The right ventricular size is mildly  enlarged.   3. The mitral valve is abnormal. Trivial mitral valve regurgitation. Mild  mitral stenosis. The mean mitral valve gradient is 5.0 mmHg. Moderate  mitral annular calcification.   4. The aortic valve is tricuspid. There is mild calcification of the  aortic valve. Aortic valve regurgitation is not visualized. No aortic  stenosis is present.      Neuro/Psych  Headaches  Anxiety Depression       GI/Hepatic Neg liver ROS, hiatal hernia,GERD  ,,  Endo/Other  diabetes, Type 2, Insulin Dependent    Renal/GU   negative genitourinary   Musculoskeletal negative musculoskeletal ROS (+)    Abdominal Normal abdominal exam  (+)   Peds  Hematology negative hematology ROS (+)   Anesthesia Other Findings   Reproductive/Obstetrics                             Anesthesia Physical Anesthesia Plan  ASA:  4  Anesthesia Plan: General   Post-op Pain Management:    Induction: Intravenous  PONV Risk Score and Plan: 3 and Ondansetron, Dexamethasone, Midazolam and Treatment may vary due to age or medical condition  Airway Management Planned: Mask and Oral ETT  Additional Equipment: Arterial line, CVP, TEE, 3D TEE and Ultrasound Guidance Line Placement  Intra-op Plan:   Post-operative Plan: Post-operative intubation/ventilation  Informed Consent: I have reviewed the patients History and Physical, chart, labs and discussed the procedure including the risks, benefits and alternatives for the proposed anesthesia with the patient or authorized representative who has indicated his/her understanding and acceptance.     Dental advisory given  Plan Discussed with: CRNA  Anesthesia Plan Comments:        Anesthesia Quick Evaluation

## 2023-02-19 NOTE — Anesthesia Procedure Notes (Signed)
Arterial Line Insertion Start/End5/28/2024 7:00 AM, 02/19/2023 7:10 AM Performed by: Nils Pyle, CRNA  Patient location: Pre-op. Preanesthetic checklist: patient identified, IV checked, site marked, risks and benefits discussed, surgical consent, monitors and equipment checked, pre-op evaluation and anesthesia consent Lidocaine 1% used for infiltration Left, radial was placed Catheter size: 20 G Hand hygiene performed  and maximum sterile barriers used   Attempts: 1 Procedure performed without using ultrasound guided technique. Following insertion, dressing applied and Biopatch. Post procedure assessment: normal and unchanged  Patient tolerated the procedure well with no immediate complications.

## 2023-02-20 ENCOUNTER — Encounter (HOSPITAL_COMMUNITY): Payer: Self-pay | Admitting: Thoracic Surgery (Cardiothoracic Vascular Surgery)

## 2023-02-20 ENCOUNTER — Inpatient Hospital Stay (HOSPITAL_COMMUNITY): Payer: PPO

## 2023-02-20 DIAGNOSIS — I200 Unstable angina: Secondary | ICD-10-CM | POA: Diagnosis not present

## 2023-02-20 LAB — GLUCOSE, CAPILLARY
Glucose-Capillary: 158 mg/dL — ABNORMAL HIGH (ref 70–99)
Glucose-Capillary: 149 mg/dL — ABNORMAL HIGH (ref 70–99)
Glucose-Capillary: 167 mg/dL — ABNORMAL HIGH (ref 70–99)
Glucose-Capillary: 144 mg/dL — ABNORMAL HIGH (ref 70–99)
Glucose-Capillary: 158 mg/dL — ABNORMAL HIGH (ref 70–99)
Glucose-Capillary: 333 mg/dL — ABNORMAL HIGH (ref 70–99)
Glucose-Capillary: 242 mg/dL — ABNORMAL HIGH (ref 70–99)
Glucose-Capillary: 159 mg/dL — ABNORMAL HIGH (ref 70–99)
Glucose-Capillary: 206 mg/dL — ABNORMAL HIGH (ref 70–99)
Glucose-Capillary: 243 mg/dL — ABNORMAL HIGH (ref 70–99)

## 2023-02-20 LAB — BASIC METABOLIC PANEL
Anion gap: 10 (ref 5–15)
Anion gap: 10 (ref 5–15)
BUN: 12 mg/dL (ref 8–23)
BUN: 15 mg/dL (ref 8–23)
CO2: 20 mmol/L — ABNORMAL LOW (ref 22–32)
CO2: 23 mmol/L (ref 22–32)
Calcium: 8.5 mg/dL — ABNORMAL LOW (ref 8.9–10.3)
Calcium: 8.8 mg/dL — ABNORMAL LOW (ref 8.9–10.3)
Chloride: 104 mmol/L (ref 98–111)
Chloride: 100 mmol/L (ref 98–111)
Creatinine, Ser: 1.11 mg/dL — ABNORMAL HIGH (ref 0.44–1.00)
Creatinine, Ser: 1.33 mg/dL — ABNORMAL HIGH (ref 0.44–1.00)
GFR, Estimated: 52 mL/min — ABNORMAL LOW (ref >60)
GFR, Estimated: 42 mL/min — ABNORMAL LOW (ref >60)
Glucose, Bld: 159 mg/dL — ABNORMAL HIGH (ref 70–99)
Glucose, Bld: 253 mg/dL — ABNORMAL HIGH (ref 70–99)
Potassium: 4.1 mmol/L (ref 3.5–5.1)
Potassium: 3.9 mmol/L (ref 3.5–5.1)
Sodium: 134 mmol/L — ABNORMAL LOW (ref 135–145)
Sodium: 133 mmol/L — ABNORMAL LOW (ref 135–145)

## 2023-02-20 LAB — CBC
HCT: 30.5 % — ABNORMAL LOW (ref 36.0–46.0)
HCT: 31.1 % — ABNORMAL LOW (ref 36.0–46.0)
Hemoglobin: 10.4 g/dL — ABNORMAL LOW (ref 12.0–15.0)
Hemoglobin: 10.2 g/dL — ABNORMAL LOW (ref 12.0–15.0)
MCH: 30.4 pg (ref 26.0–34.0)
MCH: 30.1 pg (ref 26.0–34.0)
MCHC: 34.1 g/dL (ref 30.0–36.0)
MCHC: 32.8 g/dL (ref 30.0–36.0)
MCV: 89.2 fL (ref 80.0–100.0)
MCV: 91.7 fL (ref 80.0–100.0)
Platelets: 159 10*3/uL (ref 150–400)
Platelets: 132 10*3/uL — ABNORMAL LOW (ref 150–400)
RBC: 3.42 MIL/uL — ABNORMAL LOW (ref 3.87–5.11)
RBC: 3.39 MIL/uL — ABNORMAL LOW (ref 3.87–5.11)
RDW: 14.5 % (ref 11.5–15.5)
RDW: 14.8 % (ref 11.5–15.5)
WBC: 17 10*3/uL — ABNORMAL HIGH (ref 4.0–10.5)
WBC: 12.6 10*3/uL — ABNORMAL HIGH (ref 4.0–10.5)
nRBC: 0 % (ref 0.0–0.2)
nRBC: 0 % (ref 0.0–0.2)

## 2023-02-20 LAB — ECHO INTRAOPERATIVE TEE
AR max vel: 2.3 cm2
AV Area VTI: 2.13 cm2
AV Area mean vel: 2.2 cm2
AV Mean grad: 3 mmHg
AV Peak grad: 5.9 mmHg
Ao pk vel: 1.21 m/s
Area-P 1/2: 2.76 cm2
MV VTI: 2.06 cm2
Weight: 2275.2 oz

## 2023-02-20 LAB — MAGNESIUM
Magnesium: 2.3 mg/dL (ref 1.7–2.4)
Magnesium: 2.3 mg/dL (ref 1.7–2.4)

## 2023-02-20 MED ORDER — ENOXAPARIN SODIUM 30 MG/0.3ML IJ SOSY
30.0000 mg | PREFILLED_SYRINGE | Freq: Every day | INTRAMUSCULAR | Status: DC
  Administered 2023-02-20 – 2023-02-22 (×3): 30 mg via SUBCUTANEOUS
  Filled 2023-02-20 (×3): qty 0.3

## 2023-02-20 MED ORDER — LACTULOSE 10 GM/15ML PO SOLN
20.0000 g | Freq: Once | ORAL | Status: AC
  Administered 2023-02-20: 20 g via ORAL
  Filled 2023-02-20: qty 30

## 2023-02-20 MED ORDER — INSULIN ASPART 100 UNIT/ML IJ SOLN: 0.0000 [IU] | INTRAMUSCULAR | Status: DC

## 2023-02-20 MED ORDER — METOPROLOL TARTRATE 25 MG PO TABS
25.0000 mg | ORAL_TABLET | Freq: Two times a day (BID) | ORAL | Status: DC
  Administered 2023-02-20 – 2023-02-23 (×7): 25 mg via ORAL
  Filled 2023-02-20 (×7): qty 1

## 2023-02-20 MED ORDER — TRAZODONE HCL 100 MG PO TABS
100.0000 mg | ORAL_TABLET | Freq: Every day | ORAL | Status: DC
  Administered 2023-02-20 – 2023-02-22 (×3): 100 mg via ORAL
  Filled 2023-02-20: qty 1
  Filled 2023-02-20: qty 2
  Filled 2023-02-20: qty 1

## 2023-02-20 MED ORDER — INSULIN ASPART 100 UNIT/ML IJ SOLN
0.0000 [IU] | INTRAMUSCULAR | Status: DC
  Administered 2023-02-20: 16 [IU] via SUBCUTANEOUS
  Administered 2023-02-20: 8 [IU] via SUBCUTANEOUS

## 2023-02-20 MED ORDER — INSULIN ASPART 100 UNIT/ML IJ SOLN
0.0000 [IU] | Freq: Three times a day (TID) | INTRAMUSCULAR | Status: DC
  Administered 2023-02-20 (×2): 8 [IU] via SUBCUTANEOUS
  Administered 2023-02-21: 4 [IU] via SUBCUTANEOUS

## 2023-02-20 MED ORDER — LACTULOSE 10 GM/15ML PO SOLN: 20.0000 g | Freq: Every day | ORAL | Status: DC | PRN

## 2023-02-20 NOTE — Discharge Summary (Signed)
[address]       [place] [address]             [phone]    Physician Discharge Summary  Patient ID: [name] MRN: [id number] DOB/AGE: 01-18-1951 72 y.o.  Admit date: 02/12/2023 Discharge date: 02/23/2023  Admission Diagnoses:  Patient Active Problem List   Diagnosis Date Noted   Acute hypoxic respiratory failure (HCC) 02/19/2023   Unstable angina (HCC) 02/12/2023   Stage 3a chronic kidney disease (HCC) 06/20/2022   Dyslipidemia 04/02/2022   Chest pain 03/26/2022   Nausea and vomiting 03/26/2022   Incomplete emptying of bladder 02/20/2022   Restless leg syndrome 01/09/2021   Insomnia due to medical condition 01/09/2021   Congestive heart failure (HCC) 11/30/2020   Depression, recurrent (HCC) 11/02/2020   Recurrent UTI 10/11/2020   Generalized weakness 10/11/2020   Recurrent falls 10/11/2020   QT prolongation 10/11/2020   Osteopenia after menopause 12/13/2017   H/O prolonged Q-T interval on ECG 03/06/2017   Vitamin B12 deficiency 01/13/2016   History of stroke 09/01/2015   History of TIA (transient ischemic attack) 08/14/2014   Uncontrolled type 2 diabetes mellitus with hyperglycemia, with long-term current use of insulin (HCC) 08/14/2014   Cataract 02/08/2014   Macular degeneration 02/08/2014   Accelerating angina (HCC) 01/07/2014   Generalized anxiety disorder 09/14/2013   Claudication (HCC) 06/18/2013   Gastroesophageal reflux disease without esophagitis 01/01/2012   Hypokalemia 08/24/2011   Type 2 diabetes mellitus with hyperglycemia (HCC) 08/23/2011   Hypertension associated with diabetes (HCC) 08/23/2011   PALPITATIONS 02/10/2010   Hyperlipidemia associated with type 2 diabetes mellitus (HCC) 05/10/2009   Coronary artery disease involving native coronary artery of native heart with unstable angina pectoris (HCC) 08/26/2008    Discharge Diagnoses:  Patient Active Problem List   Diagnosis Date Noted   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    Acute hypoxic respiratory failure (HCC) 02/19/2023   Unstable angina (HCC) 02/12/2023   Stage 3a chronic kidney disease (HCC) 06/20/2022   Dyslipidemia 04/02/2022   Chest pain 03/26/2022   Nausea and vomiting 03/26/2022   Incomplete emptying of bladder 02/20/2022   Restless leg syndrome 01/09/2021   Insomnia due to medical condition 01/09/2021   Congestive heart failure (HCC) 11/30/2020   Depression, recurrent (HCC) 11/02/2020   Recurrent UTI 10/11/2020   Generalized weakness 10/11/2020   Recurrent falls 10/11/2020   QT prolongation 10/11/2020   Osteopenia after menopause 12/13/2017   H/O prolonged Q-T interval on ECG 03/06/2017   Vitamin B12 deficiency 01/13/2016   History of stroke 09/01/2015   History of TIA (transient ischemic attack) 08/14/2014   Uncontrolled type 2 diabetes mellitus with hyperglycemia, with long-term current use of insulin (HCC) 08/14/2014   Cataract 02/08/2014   Macular degeneration 02/08/2014   Accelerating angina (HCC) 01/07/2014   Generalized anxiety disorder 09/14/2013   Claudication (HCC) 06/18/2013   Gastroesophageal reflux disease without esophagitis 01/01/2012   Hypokalemia 08/24/2011   Type 2 diabetes mellitus with hyperglycemia (HCC) 08/23/2011   Hypertension associated with diabetes (HCC) 08/23/2011   PALPITATIONS 02/10/2010   Hyperlipidemia associated with type 2 diabetes mellitus (HCC) 05/10/2009   Coronary artery disease involving native coronary artery of native heart with unstable angina pectoris (HCC) 08/26/2008   Discharged Condition: good  History of Present Illness:  [name] is a 72 yo female with known history of CAD, DM, and HLD.  She has multiple previous interventions to her LAD and Diagonal vessel dating back to  2008.  In July of 2023 the patient again developed unstable angina and was noted to have circumflex disease again treated with a stent.  She was admitted to the hospital in January of 2024 with unstable angina.   Catheterization at that time showed severe in stent re-stenosis which was treated with angioplasty and medically managed with ASA and Plavix.  The patient developed complaints of headache, nausea, vomiting, and hypertensive emergency.  EMS was called on 02/12/2023 however patient declined transportation to the ED.  She contacted her PCP who recommended patient present to the emergency room.  She presented [facility] and was noted to have mild to flat troponin levels.  Due to her underlying CAD history she was transported to [facility] for further workup.  Hospital Course:  EKG was obtained and showed new anterior infarct.  She developed chest pressure after transportation.  She was noted to have elevated blood glucose level.  Echocardiogram obtained showed normal EF and trivial mitral regurgitation.  Cardiac catheterization showed significant LAD disease.  It was felt coronary bypass grafting would be indicated and TCTS consult was obtained.  She was evaluated by Dr. Cliffton Asters who was in agreement coronary bypass grafting would be indicated.  She had been treated with Plavix and would require washout prior to proceeding.  She was taken to the operating room on 02/19/2023.  She was taken to the operating room and underwent CABG x 1 performed off pump utilizing LIMA to LAD.  She tolerated the procedure without difficulty and was taken to the SICU in stable condition.  She was extubated the evening of surgery.  She was weaned off Amiodarone and NTG drips as tolerated.  She had early evidence of constipation with associated bilious vomiting and was treated with laxatives.  CXR showed a small apical pneumothorax.  She was transitioned to oral Amiodarone.  She was resumed on home lisinopril for HTN.  Her chest tubes and arterial lines were removed without difficulty.  She was stable for transfer to the progressive care unit on 02/21/2023.  The patient remained in NSR.  She is ambulating independently.  Her  surgical incisions are healing without evidence of infection.  She is stable for discharge home today.  Consults: None  Significant Diagnostic Studies: angiography:   1.  Severe in-stent restenosis within the previously implanted LAD stent, treated successfully with high-pressure noncompliant balloon angioplasty reducing 95% stenosis to less than 10%, TIMI-3 flow pre and post 2.  Continued patency of the circumflex stented segment with mild in-stent restenosis 3.  Moderate downstream mid LAD in-stent restenosis 4.  Patent RCA with mild diffuse nonobstructive plaquing 5.  Normal LVEDP   Recommendations: Continue DAPT with aspirin and clopidogrel without interruption.  Favor long-term therapy with in this patient with multiple PCI procedures in the past, multiple stents, and in-stent restenotic episodes.  Treatments: surgery:        [address]       [place] [address]             [phone]                                         02/19/2023       Patient:  Renato Gails Pre-Op Dx: CAD HTN   DM HLP Post-op Dx:  same Procedure: Off pump CABG X 1.  LIMA LAD  Surgeon and Role:      * Lightfoot, Eliezer Lofts, MD - Primary   Anesthesia  general EBL:  Blood Administration: none   Drains: 19 F blake drain: L, mediastinal  Wires: none Counts: correct       Discharge Exam: Blood pressure (!) 140/62, pulse 69, temperature 98.1 F (36.7 C), temperature source Oral, resp. rate 17, height 5\' 6"  (1.676 m), weight 65.9 kg, SpO2 93 %.  General appearance: alert, cooperative, and no distress Heart: regular rate and rhythm Lungs: clear to auscultation bilaterally Abdomen: soft, non-tender; bowel sounds normal; no masses,  no organomegaly Extremities: extremities normal, atraumatic, no cyanosis or edema Wound: clean and dry   Discharge Medications:  The patient has been discharged on:   1.Beta Blocker:  Yes [ X  ]                               No   [   ]                              If No, reason:  2.Ace Inhibitor/ARB: Yes [ X  ]                                     No  [    ]                                     If No, reason:  3.Statin:   Yes [ X  ]                  No  [   ]                  If No, reason:  4.Ecasa:  Yes  [ X  ]                  No   [   ]                  If No, reason:  Patient had ACS upon admission: no  Plavix/P2Y12 inhibitor: Yes Gilian[employer]   ]                                      No  [   ]     Discharge Instructions     Amb Referral to Cardiac Rehabilitation   Complete by: As directed    Diagnosis: CABG   CABG X ___: 1   After initial evaluation and assessments completed: Virtual Based Care may be provided alone or in conjunction with Phase 2 Cardiac Rehab based on patient barriers.: Yes   Intensive Cardiac Rehabilitation (ICR) [facility] location only OR Traditional Cardiac Rehabilitation (TCR) *If criteria for ICR are not met will enroll in TCR [facility] only): Yes      Allergies as of 02/23/2023       Reactions   Iohexol     Desc: pt had syncopal episode with nausea post IV CM late 1990's,  pt has had prednisone prep with heart caths x 2 without problem  kdean 04/16/07, Onset  Date: [id number]   Ticlid [ticlopidine Hcl] Nausea And Vomiting   Jardiance [empagliflozin] Other (See Comments)   Recurrent UTIs   Metformin And Related Diarrhea   Codeine Nausea And Vomiting, Palpitations        Medication List     STOP taking these medications    atenolol 50 MG tablet Commonly known as: TENORMIN   cephALEXin 500 MG capsule Commonly known as: Keflex   isosorbide mononitrate 120 MG 24 hr tablet Commonly known as: IMDUR       TAKE these medications    acetaminophen 500 MG tablet Commonly known as: TYLENOL Take 2 tablets (1,000 mg total) by mouth every 6 (six) hours as needed.   alendronate 70 MG tablet Commonly known as: FOSAMAX Take 1 tablet (70 mg total) by mouth every 7 (seven) days.  Take with a full glass of water on an empty stomach.   amiodarone 200 MG tablet Commonly known as: PACERONE Take 1 tablet (200 mg total) by mouth 2 (two) times daily. X 7 days, then decrease to 200 mg daily   aspirin 81 MG chewable tablet Chew 1 tablet (81 mg total) by mouth daily.   clopidogrel 75 MG tablet Commonly known as: PLAVIX Take 1 tablet (75 mg total) by mouth daily with breakfast.   diphenhydrAMINE 25 mg capsule Commonly known as: BENADRYL Take 25 mg by mouth every 6 (six) hours as needed for allergies.   FreeStyle Libre 3 Sensor Misc Place 1 sensor on the skin every 14 days. Use to check glucose continuously. DX:e11.65   furosemide 40 MG tablet Commonly known as: LASIX Take 1 tablet (40 mg total) by mouth daily.   lisinopril 10 MG tablet Commonly known as: ZESTRIL Take 1 tablet (10 mg total) by mouth daily.   metoprolol tartrate 25 MG tablet Commonly known as: LOPRESSOR Take 1 tablet (25 mg total) by mouth 2 (two) times daily.   nitroGLYCERIN 0.4 MG SL tablet Commonly known as: NITROSTAT DISSOLVE 1 TABLET UNDER TONGUE FOR CHESTPAIN.MAY REPEAT EVERY 5 MINUTES FOR 3 DOSES.IF NO RELIEF CALL 911 OR GO TO ER   NovoLOG FlexPen 100 UNIT/ML FlexPen Generic drug: insulin aspart Inject 36 Units into the skin 3 (three) times daily with meals. What changed: how much to take   Ozempic (1 MG/DOSE) 2 MG/1.5ML Sopn Generic drug: Semaglutide (1 MG/DOSE) Inject 2 mg into the skin once a week.   potassium chloride 10 MEQ tablet Commonly known as: KLOR-CON M Take 1 tablet (10 mEq total) by mouth 2 (two) times daily.   rOPINIRole 0.5 MG tablet Commonly known as: Requip Take 2 tablets (1 mg total) by mouth daily as needed (restless leg).   rosuvastatin 20 MG tablet Commonly known as: CRESTOR TAKE ONE (1) TABLET BY MOUTH EVERY DAY   traZODone 100 MG tablet Commonly known as: DESYREL Take 1 tablet (100 mg total) by mouth at bedtime as needed for sleep.   Evaristo Bury  FlexTouch 100 UNIT/ML FlexTouch Pen Generic drug: insulin degludec Inject 60 Units into the skin daily.        Follow-up Information     Corliss Skains, MD Follow up on 03/01/2023.   Specialty: Cardiothoracic Surgery Why: Appointment is at 2:50 Contact information: [address] [place] [address] [phone]         Jodelle Gross, NP Follow up.   Specialties: Cardiology, Radiology, Cardiology Why: Appointment is at 10:55 Contact information: [address] [place] [address] [phone]  [facility] Follow up.   Why: [facility]--the home health agency will contact you for the first home visit. Contact information: [address] [place] [address] [phone]                 Signed:  Lowella Dandy, PA-C  02/23/2023, 7:32 AM

## 2023-02-20 NOTE — Progress Notes (Signed)
Patient ID: [name], female   DOB: 1951-02-27, 72 y.o.   MRN: [id number]  TCTS Evening Rounds:  Hemodynamically stable in sinus rhythm on IV amio.  Sats 98% RA  Ambulated.

## 2023-02-20 NOTE — Progress Notes (Addendum)
TCTS DAILY ICU PROGRESS NOTE                   [address]            [place] [address]          [phone]   1 Day Post-Op Procedure(s) (LRB): OFF PUMP CORONARY ARTERY BYPASS GRAFTING (CABG) X 1 (N/A) TRANSESOPHAGEAL ECHOCARDIOGRAM (N/A)  Total Length of Stay:  LOS: 8 days   Subjective: Some soreness  Objective: Vital signs in last 24 hours: Temp:  [92.1 F (33.4 C)-98.5 F (36.9 C)] 97.6 F (36.4 C) (05/28 2200) Pulse Rate:  [64-95] 70 (05/28 2300) Cardiac Rhythm: Normal sinus rhythm (05/28 2000) Resp:  [8-19] 16 (05/28 2300) BP: (70-172)/(50-99) 123/59 (05/28 2245) SpO2:  [100 %] 100 % (05/28 2300) Arterial Line BP: (62-160)/(27-72) 120/44 (05/28 2300) FiO2 (%):  [40 %-50 %] 40 % (05/28 1743) Weight:  [67.4 kg] 67.4 kg (05/29 0500)  Filed Weights   02/18/23 0419 02/19/23 0414 02/20/23 0500  Weight: 65.1 kg 64.5 kg 67.4 kg    Weight change: 2.898 kg   Hemodynamic parameters for last 24 hours: CVP:  [0 mmHg-14 mmHg] 0 mmHg  Intake/Output from previous day: 05/28 0701 - 05/29 0700 In: 3521 [I.V.:1898.1; IV Piggyback:1622.9] Out: 1068 [Urine:730; Emesis/NG output:100; Blood:100; Chest Tube:138]  Intake/Output this shift: No intake/output data recorded.  Current Meds: Scheduled Meds:  acetaminophen  1,000 mg Oral Q6H   Or   acetaminophen (TYLENOL) oral liquid 160 mg/5 mL  1,000 mg Per Tube Q6H   aspirin EC  325 mg Oral Daily   Or   aspirin  324 mg Per Tube Daily   bisacodyl  10 mg Oral Daily   Or   bisacodyl  10 mg Rectal Daily   Chlorhexidine Gluconate Cloth  6 each Topical Daily   docusate sodium  200 mg Oral Daily   metoprolol tartrate  12.5 mg Oral BID   Or   metoprolol tartrate  12.5 mg Per Tube BID   pantoprazole  40 mg Oral Daily   rosuvastatin  20 mg Oral Daily   sodium chloride flush  3 mL Intravenous Q12H   Continuous Infusions:  sodium chloride Stopped (02/19/23 2233)   sodium chloride     sodium chloride      albumin human Stopped (02/19/23 1605)   amiodarone 30 mg/hr (02/20/23 0219)    ceFAZolin (ANCEF) IV Stopped (02/19/23 2208)   dexmedetomidine (PRECEDEX) IV infusion Stopped (02/19/23 1525)   insulin 1 Units/hr (02/19/23 2300)   lactated ringers Stopped (02/19/23 2111)   lactated ringers     lactated ringers 50 mL/hr at 02/19/23 1127   nitroGLYCERIN 10 mcg/min (02/20/23 0147)   norepinephrine (LEVOPHED) Adult infusion 1 mcg/min (02/19/23 2300)   PRN Meds:.sodium chloride, albumin human, dextrose, fentaNYL (SUBLIMAZE) injection, fentaNYL (SUBLIMAZE) injection, fentaNYL (SUBLIMAZE) injection, lactated ringers, metoprolol tartrate, midazolam, ondansetron (ZOFRAN) IV, oxyCODONE, sodium chloride flush  General appearance: alert, cooperative, and no distress Heart: regular rate and rhythm Lungs: clear to auscultation bilaterally Abdomen: soft, non tender or distended Extremities: no edema Wound: dressing CDI  Lab Results: CBC: Recent Labs    02/19/23 1055 02/19/23 1056 02/19/23 1620 02/19/23 1809 02/19/23 1934  WBC 9.7  --  12.0*  --   --   HGB 10.7*   < > 10.2* 9.2* 9.5*  HCT 31.0*   < > 29.1* 27.0* 28.0*  PLT 147*  --  141*  --   --    < > =  values in this interval not displayed.   BMET:  Recent Labs    02/19/23 0953 02/19/23 1056 02/19/23 1620 02/19/23 1809 02/19/23 1934  NA 137   < > 136 139 138  K 3.3*   < > 3.5 3.3* 3.7  CL 100  --  100  --   --   CO2  --   --  20*  --   --   GLUCOSE 127*  --  109*  --   --   BUN 13  --  14  --   --   CREATININE 1.20*  --  1.27*  --   --   CALCIUM  --   --  9.2  --   --    < > = values in this interval not displayed.    CMET: Lab Results  Component Value Date   WBC 12.0 (H) 02/19/2023   HGB 9.5 (L) 02/19/2023   HCT 28.0 (L) 02/19/2023   PLT 141 (L) 02/19/2023   GLUCOSE 109 (H) 02/19/2023   CHOL 105 02/13/2023   TRIG 81 02/13/2023   HDL 39 (L) 02/13/2023   LDLDIRECT 93 04/06/2015   LDLCALC 50 02/13/2023   ALT 14  02/14/2023   AST 28 02/14/2023   NA 138 02/19/2023   K 3.7 02/19/2023   CL 100 02/19/2023   CREATININE 1.27 (H) 02/19/2023   BUN 14 02/19/2023   CO2 20 (L) 02/19/2023   TSH 2.110 12/11/2021   INR 1.2 02/19/2023   HGBA1C 7.8 (H) 02/13/2023   MICROALBUR negative 04/26/2014      PT/INR:  Recent Labs    02/19/23 1055  LABPROT 15.3*  INR 1.2   Radiology: DG CHEST PORT 1 VIEW  Result Date: 02/19/2023 CLINICAL DATA:  Status post cardiac surgery EXAM: PORTABLE CHEST 1 VIEW COMPARISON:  02/12/2023 FINDINGS: Cardiac size is within normal limits. There is interval cardiac surgery. Metallic sutures are seen in the sternum. Tip of endotracheal tube is 5.4 cm above the carina. There is right IJ central venous catheter with its tip in the right side of the superior mediastinum. Mediastinal drain is noted. NG tube is noted with its tip in the stomach. Linear density in the medial left lower lung field may suggest calcification in mitral annulus lower subsegmental atelectasis. Coronary artery calcifications are seen. There are no signs of alveolar pulmonary edema or focal pulmonary consolidation. There is increased density in the lateral aspect of right upper lung field, possibly caused by an artifact outside the patient's body. IMPRESSION: Postsurgical changes are noted. There are no signs of pulmonary edema or definite focal pulmonary consolidation. There is faint radiopacity in the lateral aspect of the right apex, possibly an artifact. This finding could be re-evaluated in short-term follow-up chest radiograph. Electronically Signed   By: Ernie Avena M.D.   On: 02/19/2023 15:22   EP STUDY  Result Date: 02/19/2023 See surgical note for result.    Assessment/Plan: S/P Procedure(s) (LRB): OFF PUMP CORONARY ARTERY BYPASS GRAFTING (CABG) X 1 (N/A) TRANSESOPHAGEAL ECHOCARDIOGRAM (N/A) POD#1  1 afeb, BP variable sBP 70's -170's, sinus rhythm currently, stable hemodynamics on flowtrac,  current gtts- amio/nitro 2 O2 sats good on 2 liters 3 adeq UOP, weight up about 3 kg 4 has had a few episodes of emesis 5 CT 138 ml, no air leak- likely remove 6 Blood sugars adeq controlled on gtt, routine transition , on novolog/tresiba /ozempic at home 7 creat at about baseline, 1.27, will need some diuresis 8  reactive leukocytosis WBC 17- monitor clinically 9 minor expected ABLA- monitor 10 AXR- + stool burden, will need laxatives, will try lactulose this am, slow with diet advance 11 CXR- small left apical pntx, o/w appears clear 12 CCM assisting w/ ICU management Rowe Clack PA-C Pager [phone] 02/20/2023 7:18 AM  Agree with above Titrating BB Bowel regimen Ambulation  Gearldean Lomanto Keane Scrape

## 2023-02-20 NOTE — Progress Notes (Signed)
NAME:  [name], MRN:  [id number], DOB:  07-31-1951, LOS: 8 ADMISSION DATE:  02/12/2023, CONSULTATION DATE:  02/19/23 REFERRING MD:  Cliffton Asters CHIEF COMPLAINT:  Vent management post CABG   History of Present Illness:  Pt is encephelopathic; therefore, this HPI is obtained from chart review. MATTI [name] is a 72 y.o. female who has a PMH as below including but not limited to CAD s/p multiple PCI's, DM, HTN, HLD. She developed chest pain 5/21 and was taken to [facility] ED; however, given her cardiac hx, she was later transferred to [facility]. She had cath 5/23 and was found to have severe single vessel disease. She was seen by TCTS and ultimately was taken to OR 5/28 and had off pump CABG x 1 LIMA - LAD.  Post operatively, PCCM called in consultation for assistance with post op management.  Pertinent  Medical History:  has Hyperlipidemia associated with type 2 diabetes mellitus (HCC); Coronary artery disease involving native coronary artery of native heart with unstable angina pectoris (HCC); PALPITATIONS; Type 2 diabetes mellitus with hyperglycemia (HCC); Hypertension associated with diabetes (HCC); Hypokalemia; Gastroesophageal reflux disease without esophagitis; Claudication (HCC); Generalized anxiety disorder; Accelerating angina (HCC); Cataract; Macular degeneration; History of TIA (transient ischemic attack); Uncontrolled type 2 diabetes mellitus with hyperglycemia, with long-term current use of insulin (HCC); History of stroke; Vitamin B12 deficiency; H/O prolonged Q-T interval on ECG; Osteopenia after menopause; Recurrent UTI; Generalized weakness; Recurrent falls; QT prolongation; Depression, recurrent (HCC); Congestive heart failure (HCC); Restless leg syndrome; Insomnia due to medical condition; Incomplete emptying of bladder; Chest pain; Nausea and vomiting; Dyslipidemia; Stage 3a chronic kidney disease (HCC); Unstable angina (HCC); S/P CABG x 1; and Acute hypoxic respiratory failure (HCC) on their  problem list.  Significant Hospital Events: Including procedures, antibiotic start and stop dates in addition to other pertinent events   5/21 admit 5/23 LHC > single vessel obstructive CAD involving the mid LAD, normal LV function, normal LVEDP. 5/28 > off pump CABG x 1 LIMA - LAD. 5/28 extubated   Interim History / Subjective:  Extubated successfully. Had a bout of A.fib rate controlled after 2/2 coughing. Resolved with Amio. Needs to have BM.  Objective:  Blood pressure 122/64, pulse 64, temperature 97.6 F (36.4 C), temperature source Oral, resp. rate 15, height 5\' 6"  (1.676 m), weight 67.4 kg, SpO2 99 %. CVP:  [0 mmHg-14 mmHg] 1 mmHg  Vent Mode: CPAP;PSV FiO2 (%):  [40 %-50 %] 40 % Set Rate:  [4 bmp-16 bmp] 4 bmp Vt Set:  [470 mL] 470 mL PEEP:  [5 cmH20] 5 cmH20 Pressure Support:  [10 cmH20] 10 cmH20 Plateau Pressure:  [15 cmH20] 15 cmH20   Intake/Output Summary (Last 24 hours) at 02/20/2023 4098 Last data filed at 02/20/2023 0745 Gross per 24 hour  Intake 3271.01 ml  Output 1300 ml  Net 1971.01 ml    Filed Weights   02/18/23 0419 02/19/23 0414 02/20/23 0500  Weight: 65.1 kg 64.5 kg 67.4 kg    Examination: General: Adult female, in NAD. Neuro: A&O x 3, no deficits. HEENT: [place]/AT. Sclerae anicteric.  Cardiovascular: Chest tubes x 2. RRR, No M/R/G.  Lungs: Respirations even and unlabored.  CTA bilaterally, No W/R/R. Abdomen: BS x 4, soft, NT/ND.  Musculoskeletal: No gross deformities, no edema.  Skin: Intact, warm, no rashes.   Assessment & Plan:   Severe LAD stenosis despite prior PCI's -  now ultimately s/p off pump CABG x 1 LIMA - LAD. - Postoperative care per TCTS. -  ASA. - Chest tubes per TCTS.  Acute postoperative hypoxic respiratory failure. - Continue supplemental O2 as needed to maintain SpO2 > 92%. - Ambulate. - Push IS.  Constipation. - Lactulose ordered by TCTS. - Ambulate/out of bed.  Hx HTN, HLD. - Metoprolol. - Hold home Atenolol,  Furosemide, Imdur, Lisinopril, Nitro for now. - Continue home Crestor.  Hx DM. - Insulin infusion per [facility]. - Hold home [place], Ozempic.  Hx GAD. - Resume home Trazodone QHS.   Best practice (evaluated daily):  Diet/type: Regular consistency (see orders) DVT prophylaxis: LMWH GI prophylaxis: PPI Lines: Central line and Arterial Line Foley:  Yes, and it is still needed Code Status:  full code Last date of multidisciplinary goals of care discussion: None yet.  Critical care time: 30 min.   Rutherford Guys, PA - [facility] For pager details, please see AMION or use Epic chat  After 1900, please call [facility] for cross coverage needs 02/20/2023, 8:22 AM

## 2023-02-20 NOTE — Anesthesia Postprocedure Evaluation (Signed)
Anesthesia Post Note  Patient: [name]  Procedure(s) Performed: OFF PUMP CORONARY ARTERY BYPASS GRAFTING (CABG) X 1 (Chest) TRANSESOPHAGEAL ECHOCARDIOGRAM     Patient location during evaluation: SICU Anesthesia Type: General Level of consciousness: sedated Pain management: pain level controlled Vital Signs Assessment: post-procedure vital signs reviewed and stable Respiratory status: patient remains intubated per anesthesia plan Cardiovascular status: stable Postop Assessment: no apparent nausea or vomiting Anesthetic complications: no   No notable events documented.  Last Vitals:  Vitals:   02/20/23 1015 02/20/23 1030  BP:    Pulse: 64 66  Resp: 18 16  Temp:    SpO2: 100% 100%    Last Pain:  Vitals:   02/20/23 0844  TempSrc: Oral  PainSc:                  Nelle Don Makail Watling

## 2023-02-21 ENCOUNTER — Inpatient Hospital Stay (HOSPITAL_COMMUNITY): Payer: PPO

## 2023-02-21 DIAGNOSIS — I200 Unstable angina: Secondary | ICD-10-CM

## 2023-02-21 LAB — CBC
HCT: 29.7 % — ABNORMAL LOW (ref 36.0–46.0)
Hemoglobin: 9.8 g/dL — ABNORMAL LOW (ref 12.0–15.0)
MCH: 29.6 pg (ref 26.0–34.0)
MCHC: 33 g/dL (ref 30.0–36.0)
MCV: 89.7 fL (ref 80.0–100.0)
Platelets: 101 10*3/uL — ABNORMAL LOW (ref 150–400)
RBC: 3.31 MIL/uL — ABNORMAL LOW (ref 3.87–5.11)
RDW: 14.9 % (ref 11.5–15.5)
WBC: 10.4 10*3/uL (ref 4.0–10.5)
nRBC: 0 % (ref 0.0–0.2)

## 2023-02-21 LAB — BASIC METABOLIC PANEL
Anion gap: 9 (ref 5–15)
BUN: 11 mg/dL (ref 8–23)
CO2: 23 mmol/L (ref 22–32)
Calcium: 8.4 mg/dL — ABNORMAL LOW (ref 8.9–10.3)
Chloride: 103 mmol/L (ref 98–111)
Creatinine, Ser: 1.12 mg/dL — ABNORMAL HIGH (ref 0.44–1.00)
GFR, Estimated: 52 mL/min — ABNORMAL LOW (ref >60)
Glucose, Bld: 198 mg/dL — ABNORMAL HIGH (ref 70–99)
Potassium: 3.7 mmol/L (ref 3.5–5.1)
Sodium: 135 mmol/L (ref 135–145)

## 2023-02-21 LAB — GLUCOSE, CAPILLARY
Glucose-Capillary: 198 mg/dL — ABNORMAL HIGH (ref 70–99)
Glucose-Capillary: 219 mg/dL — ABNORMAL HIGH (ref 70–99)
Glucose-Capillary: 136 mg/dL — ABNORMAL HIGH (ref 70–99)
Glucose-Capillary: 210 mg/dL — ABNORMAL HIGH (ref 70–99)

## 2023-02-21 MED ORDER — POTASSIUM CHLORIDE CRYS ER 20 MEQ PO TBCR
20.0000 meq | EXTENDED_RELEASE_TABLET | ORAL | Status: AC
  Administered 2023-02-21 (×2): 20 meq via ORAL
  Filled 2023-02-21 (×2): qty 1

## 2023-02-21 MED ORDER — SODIUM CHLORIDE 0.9% FLUSH: 3.0000 mL | INTRAVENOUS | Status: DC | PRN

## 2023-02-21 MED ORDER — BISACODYL 5 MG PO TBEC: 10.0000 mg | DELAYED_RELEASE_TABLET | Freq: Every day | ORAL | Status: DC | PRN

## 2023-02-21 MED ORDER — SODIUM CHLORIDE 0.9 % IV SOLN: 250.0000 mL | INTRAVENOUS | Status: DC | PRN

## 2023-02-21 MED ORDER — FUROSEMIDE 40 MG PO TABS
40.0000 mg | ORAL_TABLET | Freq: Every day | ORAL | Status: DC
  Administered 2023-02-21 – 2023-02-23 (×3): 40 mg via ORAL
  Filled 2023-02-21 (×3): qty 1

## 2023-02-21 MED ORDER — LISINOPRIL 10 MG PO TABS
10.0000 mg | ORAL_TABLET | Freq: Every day | ORAL | Status: DC
  Administered 2023-02-21 – 2023-02-23 (×3): 10 mg via ORAL
  Filled 2023-02-21 (×3): qty 1

## 2023-02-21 MED ORDER — AMIODARONE HCL 200 MG PO TABS
400.0000 mg | ORAL_TABLET | Freq: Two times a day (BID) | ORAL | Status: DC
  Administered 2023-02-21 – 2023-02-22 (×3): 400 mg via ORAL
  Filled 2023-02-21 (×3): qty 2

## 2023-02-21 MED ORDER — BISACODYL 10 MG RE SUPP: 10.0000 mg | Freq: Every day | RECTAL | Status: DC | PRN

## 2023-02-21 MED ORDER — ~~LOC~~ CARDIAC SURGERY, PATIENT & FAMILY EDUCATION: Freq: Once | Status: AC

## 2023-02-21 MED ORDER — INSULIN ASPART 100 UNIT/ML IJ SOLN
0.0000 [IU] | Freq: Three times a day (TID) | INTRAMUSCULAR | Status: DC
  Administered 2023-02-21 (×2): 8 [IU] via SUBCUTANEOUS
  Administered 2023-02-22 (×2): 4 [IU] via SUBCUTANEOUS
  Administered 2023-02-22: 8 [IU] via SUBCUTANEOUS

## 2023-02-21 MED ORDER — INSULIN DETEMIR 100 UNIT/ML ~~LOC~~ SOLN
20.0000 [IU] | Freq: Two times a day (BID) | SUBCUTANEOUS | Status: DC
  Administered 2023-02-21 – 2023-02-23 (×5): 20 [IU] via SUBCUTANEOUS
  Filled 2023-02-21 (×7): qty 0.2

## 2023-02-21 MED ORDER — CHLORHEXIDINE GLUCONATE CLOTH 2 % EX PADS
6.0000 | MEDICATED_PAD | Freq: Every day | CUTANEOUS | Status: DC
  Administered 2023-02-21 – 2023-02-23 (×3): 6 via TOPICAL

## 2023-02-21 MED ORDER — SODIUM CHLORIDE 0.9% FLUSH
3.0000 mL | Freq: Two times a day (BID) | INTRAVENOUS | Status: DC
  Administered 2023-02-21 – 2023-02-23 (×4): 3 mL via INTRAVENOUS

## 2023-02-21 MED FILL — Potassium Chloride Inj 2 mEq/ML: INTRAVENOUS | Qty: 40 | Status: AC

## 2023-02-21 MED FILL — Heparin Sodium (Porcine) Inj 1000 Unit/ML: Qty: 1000 | Status: AC

## 2023-02-21 MED FILL — Lidocaine HCl Local Preservative Free (PF) Inj 2%: INTRAMUSCULAR | Qty: 14 | Status: AC

## 2023-02-21 MED FILL — Cefazolin Sodium For Inj 2 GM: INTRAMUSCULAR | Qty: 2 | Status: AC

## 2023-02-21 NOTE — Progress Notes (Addendum)
TCTS DAILY ICU PROGRESS NOTE                   [address]            [place] [address]          [phone]   2 Days Post-Op Procedure(s) (LRB): OFF PUMP CORONARY ARTERY BYPASS GRAFTING (CABG) X 1 (N/A) TRANSESOPHAGEAL ECHOCARDIOGRAM (N/A)  Total Length of Stay:  LOS: 9 days   Subjective: Feels well  Objective: Vital signs in last 24 hours: Temp:  [98.3 F (36.8 C)-98.8 F (37.1 C)] 98.5 F (36.9 C) (05/29 1932) Pulse Rate:  [60-73] 66 (05/30 0600) Cardiac Rhythm: Normal sinus rhythm (05/30 0000) Resp:  [7-26] 25 (05/30 0600) BP: (108-149)/(50-96) 130/64 (05/30 0600) SpO2:  [95 %-100 %] 98 % (05/30 0600) Arterial Line BP: (114-148)/(42-59) 136/52 (05/29 1145) Weight:  [68.4 kg] 68.4 kg (05/30 0600)  Filed Weights   02/19/23 0414 02/20/23 0500 02/21/23 0600  Weight: 64.5 kg 67.4 kg 68.4 kg    Weight change: 1 kg   Hemodynamic parameters for last 24 hours: CVP:  [5 mmHg-10 mmHg] 9 mmHg  Intake/Output from previous day: 05/29 0701 - 05/30 0700 In: 857.8 [I.V.:491.3; IV Piggyback:366.5] Out: 1280 [Urine:1150; Chest Tube:130]  Intake/Output this shift: No intake/output data recorded.  Current Meds: Scheduled Meds:  acetaminophen  1,000 mg Oral Q6H   Or   acetaminophen (TYLENOL) oral liquid 160 mg/5 mL  1,000 mg Per Tube Q6H   aspirin EC  325 mg Oral Daily   Or   aspirin  324 mg Per Tube Daily   bisacodyl  10 mg Oral Daily   Or   bisacodyl  10 mg Rectal Daily   Chlorhexidine Gluconate Cloth  6 each Topical Daily   docusate sodium  200 mg Oral Daily   enoxaparin (LOVENOX) injection  30 mg Subcutaneous QHS   insulin aspart  0-24 Units Subcutaneous TID AC & HS   metoprolol tartrate  25 mg Oral BID   pantoprazole  40 mg Oral Daily   potassium chloride  20 mEq Oral Q4H   rosuvastatin  20 mg Oral Daily   sodium chloride flush  3 mL Intravenous Q12H   traZODone  100 mg Oral QHS   Continuous Infusions:  sodium chloride     amiodarone 30  mg/hr (02/21/23 0600)   lactated ringers Stopped (02/19/23 2111)   PRN Meds:.dextrose, lactated ringers, lactulose, metoprolol tartrate, ondansetron (ZOFRAN) IV, oxyCODONE, sodium chloride flush  General appearance: alert, cooperative, and no distress Heart: regular rate and rhythm Lungs: mildly dim in bases Abdomen: benign Extremities: no edema Wound: incis healing well  Lab Results: CBC: Recent Labs    02/20/23 1612 02/21/23 0447  WBC 12.6* 10.4  HGB 10.2* 9.8*  HCT 31.1* 29.7*  PLT 132* 101*   BMET:  Recent Labs    02/20/23 1612 02/21/23 0447  NA 133* 135  K 3.9 3.7  CL 100 103  CO2 23 23  GLUCOSE 253* 198*  BUN 15 11  CREATININE 1.33* 1.12*  CALCIUM 8.8* 8.4*    CMET: Lab Results  Component Value Date   WBC 10.4 02/21/2023   HGB 9.8 (L) 02/21/2023   HCT 29.7 (L) 02/21/2023   PLT 101 (L) 02/21/2023   GLUCOSE 198 (H) 02/21/2023   CHOL 105 02/13/2023   TRIG 81 02/13/2023   HDL 39 (L) 02/13/2023   LDLDIRECT 93 04/06/2015   LDLCALC 50 02/13/2023   ALT 14 02/14/2023  AST 28 02/14/2023   NA 135 02/21/2023   K 3.7 02/21/2023   CL 103 02/21/2023   CREATININE 1.12 (H) 02/21/2023   BUN 11 02/21/2023   CO2 23 02/21/2023   TSH 2.110 12/11/2021   INR 1.2 02/19/2023   HGBA1C 7.8 (H) 02/13/2023   MICROALBUR negative 04/26/2014      PT/INR:  Recent Labs    02/19/23 1055  LABPROT 15.3*  INR 1.2   Radiology: No results found.   Assessment/Plan: S/P Procedure(s) (LRB): OFF PUMP CORONARY ARTERY BYPASS GRAFTING (CABG) X 1 (N/A) TRANSESOPHAGEAL ECHOCARDIOGRAM (N/A) POD#2  1 afeb, VSS, s BP 100's-140's, sinus rhythm- om amio gtt, resume lisinopril 2 O2 sats good on RA 3 good diuresis, weight slightly up if accurate 4 creat 1.12 today , appears to have peaked at 1.33, will add short term lasix 5 leukocytosis resolved 6 expected ABLA is stable 7 thrombocytopenia is trending lower, PLT 101 K , may need to consider d/c lovenox 8 BS poor control- will  add levemir BID 9 CXR - left apical pntx, basilar atx 10 tx- 4e, d/c chest tubes and lines, change to po Junie Bame PA-C Pager [phone] 02/21/2023 7:08 AM  Agree with above Will remove Cts Transfer to floor  Corliss Skains

## 2023-02-21 NOTE — Discharge Instructions (Signed)

## 2023-02-21 NOTE — Progress Notes (Signed)
NAME:  [name], MRN:  [id number], DOB:  [birth year], LOS: 9 ADMISSION DATE:  02/12/2023, CONSULTATION DATE:  02/19/23 REFERRING MD:  Cliffton Asters CHIEF COMPLAINT:  Vent management post CABG   History of Present Illness:  Pt is encephelopathic; therefore, this HPI is obtained from chart review. [name] is a 72 y.o. female who has a PMH as below including but not limited to CAD s/p multiple PCI's, DM, HTN, HLD. She developed chest pain 5/21 and was taken to [facility] ED; however, given her cardiac hx, she was later transferred to [facility]. She had cath 5/23 and was found to have severe single vessel disease. She was seen by TCTS and ultimately was taken to OR 5/28 and had off pump CABG x 1 LIMA - LAD.  Post operatively, PCCM called in consultation for assistance with post op management.  Pertinent  Medical History:  has Hyperlipidemia associated with type 2 diabetes mellitus (HCC); Coronary artery disease involving native coronary artery of native heart with unstable angina pectoris (HCC); PALPITATIONS; Type 2 diabetes mellitus with hyperglycemia (HCC); Hypertension associated with diabetes (HCC); Hypokalemia; Gastroesophageal reflux disease without esophagitis; Claudication (HCC); Generalized anxiety disorder; Accelerating angina (HCC); Cataract; Macular degeneration; History of TIA (transient ischemic attack); Uncontrolled type 2 diabetes mellitus with hyperglycemia, with long-term current use of insulin (HCC); History of stroke; Vitamin B12 deficiency; H/O prolonged Q-T interval on ECG; Osteopenia after menopause; Recurrent UTI; Generalized weakness; Recurrent falls; QT prolongation; Depression, recurrent (HCC); Congestive heart failure (HCC); Restless leg syndrome; Insomnia due to medical condition; Incomplete emptying of bladder; Chest pain; Nausea and vomiting; Dyslipidemia; Stage 3a chronic kidney disease (HCC); Unstable angina (HCC); S/P CABG x 1; and Acute hypoxic respiratory failure (HCC) on their  problem list.  Significant Hospital Events: Including procedures, antibiotic start and stop dates in addition to other pertinent events   5/21 admit 5/23 LHC > single vessel obstructive CAD involving the mid LAD, normal LV function, normal LVEDP. 5/28 > off pump CABG x 1 LIMA - LAD. 5/28 extubated 5/29 Extubated successfully. Had a bout of A.fib rate controlled after 2/2 coughing. Resolved with Amio. 5/30 no acute events overnight chest tubes and central line has been removed.  No longer on any drips.  TCTS surgery plans to transfer to floor  Interim History / Subjective:  Seen lying in bed with no acute complaints.  States she feels well  Objective:  Blood pressure (!) 139/49, pulse 70, temperature 98.9 F (37.2 C), temperature source Oral, resp. rate (!) 21, height 5\' 6"  (1.676 m), weight 68.4 kg, SpO2 96 %. CVP:  [5 mmHg-10 mmHg] 9 mmHg      Intake/Output Summary (Last 24 hours) at 02/21/2023 0908 Last data filed at 02/21/2023 0900 Gross per 24 hour  Intake 612.75 ml  Output 1230 ml  Net -617.25 ml    Filed Weights   02/19/23 0414 02/20/23 0500 02/21/23 0600  Weight: 64.5 kg 67.4 kg 68.4 kg    Examination: General: Well appearing elderly female lying in bed in no acute distress HEENT: [place]/AT, MM pink/moist, PERRL,  Neuro: Alert and oriented x 3, nonfocal CV: s1s2 regular rate and rhythm, no murmur, rubs, or gallops,  PULM: Clear to auscultation bilaterally, no increased work of breathing, no added breath sounds GI: soft, bowel sounds active in all 4 quadrants, non-tender, non-distended, tolerating oral diet Extremities: warm/dry, no edema  Skin: no rashes or lesions  Resolved problems:  Acute postoperative hypoxic respiratory failure  Assessment & Plan:   Severe  LAD stenosis despite prior PCI's -s/p off pump CABG x 1 LIMA - LAD. Hx HTN, HLD. P: Postoperative management per TCTS Continue ASA and statin Continue p.o. amiodarone Continue lisinopril and  Lopressor Ensure adequate pain control Strict intake and output with daily assessment of continued need to diurese  Constipation P: Continue bowel regiment Mobilize as able  Type 2 diabetes -Home medication includes NovoLog, Tresiba, and semaglutide P: Continue SSI Long-acting insulin started per TCTS 5/30 CBG goal 140-180 Home Tresiba and semaglutide remain on hold  Hx GAD P: Continue home nocturnal trazodone   Best practice (evaluated daily):  Diet/type: Regular consistency (see orders) DVT prophylaxis: LMWH GI prophylaxis: PPI Lines: Central line and Arterial Line Foley:  Yes, and it is still needed Code Status:  full code Last date of multidisciplinary goals of care discussion: Per Primary  Critical care time:  NA   Severin Bou D. Harris, NP-C [facility] Personal contact information can be found on Amion  If no contact or response made please call [phone] 02/21/2023, 9:12 AM

## 2023-02-21 NOTE — Progress Notes (Signed)
Mobility Specialist Progress Note:    02/21/23 1426  Mobility  Activity Ambulated with assistance in hallway  Level of Assistance Minimal assist, patient does 75% or more  Assistive Device None  Distance Ambulated (ft) 400 ft  Activity Response Tolerated well  Mobility Referral Yes  $Mobility charge 1 Mobility  Mobility Specialist Start Time (ACUTE ONLY) 1409  Mobility Specialist Stop Time (ACUTE ONLY) 1420  Mobility Specialist Time Calculation (min) (ACUTE ONLY) 11 min   Pt received in chair needing minA to stand and agreeable to ambulate. During ambulation pt needed corrections d/t tilting to the R side. Pt adhered to sternal precautions. No c/o throughout. Pt assisted back to bed with call bell at hand.  Thompson Grayer Mobility Specialist  Please contact vis Secure Chat or  Rehab Office [phone]

## 2023-02-22 ENCOUNTER — Inpatient Hospital Stay (HOSPITAL_COMMUNITY): Payer: PPO

## 2023-02-22 LAB — CBC
HCT: 34.4 % — ABNORMAL LOW (ref 36.0–46.0)
Hemoglobin: 11.5 g/dL — ABNORMAL LOW (ref 12.0–15.0)
MCH: 30.7 pg (ref 26.0–34.0)
MCHC: 33.4 g/dL (ref 30.0–36.0)
MCV: 92 fL (ref 80.0–100.0)
Platelets: 143 10*3/uL — ABNORMAL LOW (ref 150–400)
RBC: 3.74 MIL/uL — ABNORMAL LOW (ref 3.87–5.11)
RDW: 14.9 % (ref 11.5–15.5)
WBC: 11 10*3/uL — ABNORMAL HIGH (ref 4.0–10.5)
nRBC: 0 % (ref 0.0–0.2)

## 2023-02-22 LAB — BASIC METABOLIC PANEL
Anion gap: 11 (ref 5–15)
BUN: 17 mg/dL (ref 8–23)
CO2: 23 mmol/L (ref 22–32)
Calcium: 8.9 mg/dL (ref 8.9–10.3)
Chloride: 103 mmol/L (ref 98–111)
Creatinine, Ser: 1.14 mg/dL — ABNORMAL HIGH (ref 0.44–1.00)
GFR, Estimated: 51 mL/min — ABNORMAL LOW (ref >60)
Glucose, Bld: 163 mg/dL — ABNORMAL HIGH (ref 70–99)
Potassium: 4.4 mmol/L (ref 3.5–5.1)
Sodium: 137 mmol/L (ref 135–145)

## 2023-02-22 LAB — GLUCOSE, CAPILLARY
Glucose-Capillary: 70 mg/dL (ref 70–99)
Glucose-Capillary: 187 mg/dL — ABNORMAL HIGH (ref 70–99)
Glucose-Capillary: 190 mg/dL — ABNORMAL HIGH (ref 70–99)
Glucose-Capillary: 221 mg/dL — ABNORMAL HIGH (ref 70–99)
Glucose-Capillary: 180 mg/dL — ABNORMAL HIGH (ref 70–99)

## 2023-02-22 MED ORDER — METOPROLOL TARTRATE 25 MG PO TABS: 25.0000 mg | ORAL_TABLET | Freq: Two times a day (BID) | ORAL | 3 refills | Status: DC

## 2023-02-22 MED ORDER — ACETAMINOPHEN 500 MG PO TABS: 1000.0000 mg | ORAL_TABLET | Freq: Four times a day (QID) | ORAL | 0 refills | Status: DC | PRN

## 2023-02-22 MED ORDER — AMIODARONE HCL 200 MG PO TABS: 200.0000 mg | ORAL_TABLET | Freq: Two times a day (BID) | ORAL | 1 refills | Status: DC

## 2023-02-22 MED ORDER — AMIODARONE HCL 200 MG PO TABS
200.0000 mg | ORAL_TABLET | Freq: Two times a day (BID) | ORAL | Status: DC
  Administered 2023-02-22 – 2023-02-23 (×2): 200 mg via ORAL
  Filled 2023-02-22 (×2): qty 1

## 2023-02-22 NOTE — Progress Notes (Signed)
Mobility Specialist Progress Note:    02/22/23 1055  Mobility  Activity Ambulated with assistance in hallway  Level of Assistance Contact guard assist, steadying assist  Assistive Device None  Distance Ambulated (ft) 400 ft  Activity Response Tolerated well  Mobility Referral Yes  $Mobility charge 1 Mobility  Mobility Specialist Start Time (ACUTE ONLY) [id number]  Mobility Specialist Stop Time (ACUTE ONLY) 0931  Mobility Specialist Time Calculation (min) (ACUTE ONLY) 7 min   Received pt in bed having no complaints and agreeable to mobility. Pt was asymptomatic throughout ambulation and returned to room w/o fault. Left in bed w/ call bell in reach and all needs met.   Thompson Grayer Mobility Specialist  Please contact vis Secure Chat or  Rehab Office [phone]

## 2023-02-22 NOTE — Progress Notes (Signed)
CARDIAC REHAB PHASE I   PRE:  Rate/Rhythm: 71 SR   BP:  Sitting: 124/55      SaO2: 96 RA   MODE:  Ambulation: 240 ft   POST:  Rate/Rhythm: 78 SR  BP:  Sitting: 133/67      SaO2: 98 RA  Pt ambulated in hall, moving at slow steady pace. Tolerated well with no pain, SOB or dizziness. Returned to chair with call bell and bedside table in reach. Post OHS education including site care, restrictions, risk factors, sternal precautions, IS use at home, home needs at discharge, heart healthy diet, exercise guidelines and CRP2 reviewed. All questions and concerns addressed. Will refer to [facility] for CRP2. Will continue to follow.   [id number]  Woodroe Chen, RN BSN 02/22/2023 11:40 AM

## 2023-02-22 NOTE — Progress Notes (Addendum)
      [address]       [place] [address]             [phone]      3 Days Post-Op Procedure(s) (LRB): OFF PUMP CORONARY ARTERY BYPASS GRAFTING (CABG) X 1 (N/A) TRANSESOPHAGEAL ECHOCARDIOGRAM (N/A)  Subjective:  Patient looks great.  She has no complaints.  Can't wait to take a shower at home.  She is ambulating.  +BM  Objective: Vital signs in last 24 hours: Temp:  [98 F (36.7 C)-98.9 F (37.2 C)] 98 F (36.7 C) (05/31 0623) Pulse Rate:  [63-72] 70 (05/31 0623) Cardiac Rhythm: Normal sinus rhythm (05/30 2300) Resp:  [16-23] 18 (05/31 0623) BP: (113-146)/(49-99) 113/61 (05/31 0623) SpO2:  [96 %-100 %] 99 % (05/31 0623) Weight:  [65.9 kg] 65.9 kg (05/31 0623)  Intake/Output from previous day: 05/30 0701 - 05/31 0700 In: 74.8 [I.V.:41.3; IV Piggyback:33.5] Out: 900 [Urine:900]  General appearance: alert, cooperative, and no distress Heart: regular rate and rhythm Lungs: clear to auscultation bilaterally Abdomen: soft, non-tender; bowel sounds normal; no masses,  no organomegaly Extremities: edema trace Wound: clean and dry  Lab Results: Recent Labs    02/20/23 1612 02/21/23 0447  WBC 12.6* 10.4  HGB 10.2* 9.8*  HCT 31.1* 29.7*  PLT 132* 101*   BMET:  Recent Labs    02/20/23 1612 02/21/23 0447  NA 133* 135  K 3.9 3.7  CL 100 103  CO2 23 23  GLUCOSE 253* 198*  BUN 15 11  CREATININE 1.33* 1.12*  CALCIUM 8.8* 8.4*    PT/INR:  Recent Labs    02/19/23 1055  LABPROT 15.3*  INR 1.2   ABG    Component Value Date/Time   PHART 7.361 02/19/2023 1934   HCO3 18.8 (L) 02/19/2023 1934   TCO2 20 (L) 02/19/2023 1934   ACIDBASEDEF 6.0 (H) 02/19/2023 1934   O2SAT 100 02/19/2023 1934   CBG (last 3)  Recent Labs    02/21/23 1801 02/21/23 2142 02/22/23 0625  GLUCAP 136* 210* 70    Assessment/Plan: S/P Procedure(s) (LRB): OFF PUMP CORONARY ARTERY BYPASS GRAFTING (CABG) X 1 (N/A) TRANSESOPHAGEAL ECHOCARDIOGRAM (N/A)  CV- NSR-  on Amiodarone, Lisinopril Pulm- no issues, off oxygen, CXR looks good with trace left pleural effusion Renal- creatinine was trending down, appears euvolemic on exam, will continue Lasix, potassium which she was on prior to admission DM- sugars stable, continue insulin Dispo- patient looks great, in NSR.. her grandson will help her at discharge, will discuss dispo medications with Dr. Cliffton Asters, no DME needs... home in AM if no issues arise   LOS: 10 days   Lowella Dandy, PA-C 02/22/2023  Agree with above Doing well Dispo planning  Demetrio Leighty O Videl Nobrega

## 2023-02-22 NOTE — Plan of Care (Signed)
  Problem: Activity: Goal: Ability to tolerate increased activity will improve Outcome: Progressing   

## 2023-02-22 NOTE — TOC Progression Note (Signed)
Transition of Care [facility]) - Progression Note    Patient Details  Name: [name] MRN: [id number] Date of Birth: [birth year]  Transition of Care [facility]) CM/SW Contact  Kermit Balo, RN Phone Number: 02/22/2023, 1:45 PM  Clinical Narrative:    Per notes plan for d/c home tomorrow if pt is stable. [facility] pre-arranged through MD office for home health. Will need HH orders prior to d/c.  No DME currently recommended.  TOC following.        Expected Discharge Plan and Services                                               Social Determinants of Health (SDOH) Interventions SDOH Screenings   Food Insecurity: No Food Insecurity (02/14/2023)  Housing: Low Risk  (02/14/2023)  Transportation Needs: No Transportation Needs (02/14/2023)  Utilities: Not At Risk (02/14/2023)  Alcohol Screen: Low Risk  (01/24/2023)  Depression (PHQ2-9): Medium Risk (02/04/2023)  Financial Resource Strain: Low Risk  (01/24/2023)  Physical Activity: Insufficiently Active (01/24/2023)  Social Connections: Moderately Integrated (01/24/2023)  Stress: No Stress Concern Present (01/24/2023)  Tobacco Use: Low Risk  (02/20/2023)    Readmission Risk Interventions    10/12/2020   10:49 AM  Readmission Risk Prevention Plan  Transportation Screening Complete  Home Care Screening Complete  Medication Review (RN CM) Complete

## 2023-02-23 LAB — GLUCOSE, CAPILLARY
Glucose-Capillary: 66 mg/dL — ABNORMAL LOW (ref 70–99)
Glucose-Capillary: 77 mg/dL (ref 70–99)
Glucose-Capillary: 302 mg/dL — ABNORMAL HIGH (ref 70–99)

## 2023-02-23 NOTE — Progress Notes (Signed)
Pt had an order to d/c sutures, dressing to mid chest/epigastric area removed, pt had no sutures in place. Chest tube incision areas intact, pt wanted steri strip placed over incision areas for comfort that areas will stay together.  Pt instructed on care.

## 2023-02-23 NOTE — Progress Notes (Signed)
CARDIAC REHAB PHASE I   Pt is ready for discharge home. Educational needs met. Referral to [facility] has been sent.   [id number]  Woodroe Chen, RN BSN 02/23/2023 9:12 AM

## 2023-02-23 NOTE — Progress Notes (Addendum)
      [address]       [place] [address]             [phone]      4 Days Post-Op Procedure(s) (LRB): OFF PUMP CORONARY ARTERY BYPASS GRAFTING (CABG) X 1 (N/A) TRANSESOPHAGEAL ECHOCARDIOGRAM (N/A)  Subjective:  Patient up in chair.  No complaints continues to feel good.  Her daughter was able to pick up her medicine yesterday. She is ambulating w/o difficulty.  + BM  Objective: Vital signs in last 24 hours: Temp:  [97.4 F (36.3 C)-98.2 F (36.8 C)] 98.1 F (36.7 C) (06/01 0300) Pulse Rate:  [69-72] 69 (05/31 2009) Cardiac Rhythm: Normal sinus rhythm (05/31 1908) Resp:  [17-19] 17 (06/01 0300) BP: (124-150)/(55-70) 140/62 (06/01 0300) SpO2:  [93 %-99 %] 93 % (06/01 0300) Weight:  [65.9 kg] 65.9 kg (06/01 0530)  Intake/Output from previous day: 05/31 0701 - 06/01 0700 In: 600 [P.O.:600] Out: 400 [Urine:400]  General appearance: alert, cooperative, and no distress Heart: regular rate and rhythm Lungs: clear to auscultation bilaterally Abdomen: soft, non-tender; bowel sounds normal; no masses,  no organomegaly Extremities: extremities normal, atraumatic, no cyanosis or edema Wound: clean and dry  Lab Results: Recent Labs    02/21/23 0447 02/22/23 0740  WBC 10.4 11.0*  HGB 9.8* 11.5*  HCT 29.7* 34.4*  PLT 101* 143*   BMET:  Recent Labs    02/21/23 0447 02/22/23 0740  NA 135 137  K 3.7 4.4  CL 103 103  CO2 23 23  GLUCOSE 198* 163*  BUN 11 17  CREATININE 1.12* 1.14*  CALCIUM 8.4* 8.9    PT/INR: No results for input(s): "LABPROT", "INR" in the last 72 hours. ABG    Component Value Date/Time   PHART 7.361 02/19/2023 1934   HCO3 18.8 (L) 02/19/2023 1934   TCO2 20 (L) 02/19/2023 1934   ACIDBASEDEF 6.0 (H) 02/19/2023 1934   O2SAT 100 02/19/2023 1934   CBG (last 3)  Recent Labs    02/22/23 2102 02/23/23 0549 02/23/23 0607  GLUCAP 180* 66* 77    Assessment/Plan: S/P Procedure(s) (LRB): OFF PUMP CORONARY ARTERY BYPASS  GRAFTING (CABG) X 1 (N/A) TRANSESOPHAGEAL ECHOCARDIOGRAM (N/A)  CV- NSR, BP stable- continue Amiodarone, Lopressor, Lisinopril Pulm- no issues, not requiring oxygen, continue IS Renal- creatinine has been stable, no edema on exam, on home regimen of Lasix DM-sugars controlled H/H arranged through our office [facility]Marland Kitchen orders placed, no DME needs DIspo- patient stable, d/c home today   LOS: 11 days   Lowella Dandy, PA-C 02/23/2023

## 2023-02-23 NOTE — Progress Notes (Addendum)
Discharge instructions reviewed with pt.  Pt has verbalized understanding of instructions and medications. CR instructions were completed by CR staff this morning.  Copy of instructions given to pt. Pt's scripts were sent to her pharmacy and pt states her daughter has already picked them up. Pt states her daughter is on her way to pick her up and bringing her clothes.        Pt instructed to call front desk when her daughter arrives and she is dressed to go and staff will take her out for discharge.         Annice Needy, RN SWOT

## 2023-02-23 NOTE — TOC Transition Note (Signed)
Transition of Care [facility]) - CM/SW Discharge Note   Patient Details  Name: [name] MRN: [id number] Date of Birth: 1950-10-12  Transition of Care [facility] CM/SW Contact:  Lawerance Sabal, RN Phone Number: 02/23/2023, 8:55 AM   Clinical Narrative:     Talmadge Chad that patient will DC to home today   Final next level of care: Home w Home Health Services Barriers to Discharge: No Barriers Identified   Patient Goals and CMS Choice      Discharge Placement                         Discharge Plan and Services Additional resources added to the After Visit Summary for                              [facility] Agency: [facility] Date [facility] Agency Contacted: 02/23/23 Time HH Agency Contacted: [phone] Representative spoke with at [facility] Agency: Alyssa  Social Determinants of Health (SDOH) Interventions SDOH Screenings   Food Insecurity: No Food Insecurity (02/14/2023)  Housing: Low Risk  (02/14/2023)  Transportation Needs: No Transportation Needs (02/14/2023)  Utilities: Not At Risk (02/14/2023)  Alcohol Screen: Low Risk  (01/24/2023)  Depression (PHQ2-9): Medium Risk (02/04/2023)  Financial Resource Strain: Low Risk  (01/24/2023)  Physical Activity: Insufficiently Active (01/24/2023)  Social Connections: Moderately Integrated (01/24/2023)  Stress: No Stress Concern Present (01/24/2023)  Tobacco Use: Low Risk  (02/20/2023)     Readmission Risk Interventions    10/12/2020   10:49 AM  Readmission Risk Prevention Plan  Transportation Screening Complete  Home Care Screening Complete  Medication Review (RN CM) Complete

## 2023-02-25 ENCOUNTER — Ambulatory Visit: Payer: Self-pay

## 2023-02-25 ENCOUNTER — Telehealth: Payer: Self-pay

## 2023-02-25 NOTE — Transitions of Care (Post Inpatient/ED Visit) (Signed)
02/25/2023  Name: [name] MRN: [id number] DOB: Apr 22, 1951  Today's TOC FU Call Status: Today's TOC FU Call Status:: Successful TOC FU Call Competed TOC FU Call Complete Date: 02/25/23  Transition Care Management Follow-up Telephone Call Date of Discharge: 02/23/23 Discharge Facility: [facility]) Type of Discharge: Inpatient Admission Primary Inpatient Discharge Diagnosis:: Unstable Angina How have you been since you were released from the hospital?: Better Any questions or concerns?: No  Items Reviewed: Did you receive and understand the discharge instructions provided?: Yes Medications obtained,verified, and reconciled?: Yes (Medications Reviewed) Any new allergies since your discharge?: No Dietary orders reviewed?: Yes Type of Diet Ordered:: Diabetic Do you have support at home?: Yes People in Home: child(ren), adult Name of Support/Comfort Primary Source: Nedra Hai  Medications Reviewed Today: Medications Reviewed Today     Reviewed by Jodelle Gross, RN (Case Manager) on 02/25/23 at 1345  Med List Status: <None>   Medication Order Taking? Sig Documenting Provider Last Dose Status Informant  acetaminophen (TYLENOL) 500 MG tablet [id number] Yes Take 2 tablets (1,000 mg total) by mouth every 6 (six) hours as needed. Barrett, Rae Roam, PA-C Taking Active   alendronate (FOSAMAX) 70 MG tablet [id number] Yes Take 1 tablet (70 mg total) by mouth every 7 (seven) days. Take with a full glass of water on an empty stomach. Gabriel Earing, FNP Taking Active Self, Pharmacy Records  amiodarone (PACERONE) 200 MG tablet [id number] Yes Take 1 tablet (200 mg total) by mouth 2 (two) times daily. X 7 days, then decrease to 200 mg daily Barrett, Erin R, PA-C Taking Active   aspirin 81 MG chewable tablet [id number] Yes Chew 1 tablet (81 mg total) by mouth daily. Cathren Harsh, MD Taking Active Self, Pharmacy Records  clopidogrel (PLAVIX) 75 MG tablet [id number] Yes Take 1 tablet (75 mg total) by  mouth daily with breakfast. Rai, Delene Ruffini, MD Taking Active Self, Pharmacy Records           Med Note Mayford Knife, [place]   Tue Feb 12, 2023  5:28 PM) Pt states she does not remember for sure she thinks it was a medication that she was going to take when she went back home.  Continuous Blood Gluc Sensor (FREESTYLE LIBRE 3 SENSOR) [place] [id number] Yes Place 1 sensor on the skin every 14 days. Use to check glucose continuously. DX:e11.65 Junie Spencer, FNP Taking Active Self, Pharmacy Records  diphenhydrAMINE (BENADRYL) 25 mg capsule [id number] No Take 25 mg by mouth every 6 (six) hours as needed for allergies. [provider] Unknown Active Self, Pharmacy Records  furosemide (LASIX) 40 MG tablet [id number] Yes Take 1 tablet (40 mg total) by mouth daily. Gabriel Earing, FNP Taking Active Self, Pharmacy Records  insulin aspart (NOVOLOG FLEXPEN) 100 UNIT/ML FlexPen [id number] Yes Inject 36 Units into the skin 3 (three) times daily with meals.  Patient taking differently: Inject 25-36 Units into the skin 3 (three) times daily with meals.   Joseph Art, DO Taking Active Self, Pharmacy Records           Med Note [facility], RACHEL S   Tue Nov 13, 2022  9:16 AM) Leanora Ivanoff as 30-40 units BID  insulin degludec (TRESIBA FLEXTOUCH) 100 UNIT/ML FlexTouch Pen [id number] Yes Inject 60 Units into the skin daily. [provider] Taking Active Self, Pharmacy Records           Med Note Vance Gather, Roda Shutters   Tue Nov 13, 2022  9:17 AM) Per pt,  takes 36 units  lisinopril (ZESTRIL) 10 MG tablet [id number] Yes Take 1 tablet (10 mg total) by mouth daily. Rollene Rotunda, MD Taking Active Self, Pharmacy Records  metoprolol tartrate (LOPRESSOR) 25 MG tablet [id number] Yes Take 1 tablet (25 mg total) by mouth 2 (two) times daily. Barrett, Erin R, PA-C Taking Active   nitroGLYCERIN (NITROSTAT) 0.4 MG SL tablet [id number] No DISSOLVE 1 TABLET UNDER TONGUE FOR CHESTPAIN.MAY REPEAT EVERY 5 MINUTES FOR 3 DOSES.IF NO  RELIEF CALL 911 OR GO TO ER Rai, Delene Ruffini, MD Unknown Active Self, Pharmacy Records  potassium chloride (KLOR-CON M) 10 MEQ tablet [id number] No Take 1 tablet (10 mEq total) by mouth 2 (two) times daily.  Patient not taking: Reported on 02/12/2023   Gabriel Earing, FNP Not Taking Active Self, Pharmacy Records           Med Note Mayford Knife, [place] Feb 12, 2023  5:31 PM) Pt states Doctor wants her to start again   rOPINIRole (REQUIP) 0.5 MG tablet [id number] No Take 2 tablets (1 mg total) by mouth daily as needed (restless leg). Joseph Art, DO Unknown Active Self, Pharmacy Records  rosuvastatin (CRESTOR) 20 MG tablet [id number] Yes TAKE ONE (1) TABLET BY MOUTH EVERY DAY Gabriel Earing, FNP Taking Active Self, Pharmacy Records  Semaglutide, 1 MG/DOSE, (OZEMPIC, 1 MG/DOSE,) 2 MG/1.5ML SOPN [id number] Yes Inject 2 mg into the skin once a week. [provider] Taking Active Self, Pharmacy Records           Med Note Donn Pierini Feb 07, 2023 10:58 AM) Via novo nordisk patient assistance program Takes on Thursdays   traZODone (DESYREL) 100 MG tablet [id number] Yes Take 1 tablet (100 mg total) by mouth at bedtime as needed for sleep. Gabriel Earing, FNP Taking Active Self, Pharmacy Records  Med List Note Gabriel Earing, [facility] 08/09/21 1015): nov            Home Care and Equipment/Supplies: Were Home Health Services Ordered?: Yes Name of Home Health Agency:: [facility] Has Agency set up a time to come to your home?: No EMR reviewed for Home Health Orders:  ([facility] called but are calling back to schedule time) Any new equipment or medical supplies ordered?: No  Functional Questionnaire: Do you need assistance with bathing/showering or dressing?: No Do you need assistance with meal preparation?: No Do you need assistance with eating?: No Do you have difficulty maintaining continence: No Do you need assistance with getting out of bed/getting out of a  chair/moving?: No Do you have difficulty managing or taking your medications?: No  Follow up appointments reviewed: PCP Follow-up appointment confirmed?: No (Patient to call for follow up) Specialist Hospital Follow-up appointment confirmed?: Yes Date of Specialist follow-up appointment?: 03/01/23 Follow-Up Specialty Provider:: Dr. Cliffton Asters Do you need transportation to your follow-up appointment?: No Do you understand care options if your condition(s) worsen?: Yes-patient verbalized understanding  Jodelle Gross, RN, BSN, CCM Care Management Coordinator [facility] Phone: [phone]/Fax: [phone]

## 2023-02-25 NOTE — Chronic Care Management (AMB) (Signed)
   02/25/2023  [name] Mar 19, 1951 [id number]   Reason for Encounter: Patient is not currently enrolled in the CCM program. CCM status changed to previously enrolled  Alto Denver RN, MSN, CCM RN Care Manager  Chronic Care Management Direct Number: [phone]

## 2023-02-26 ENCOUNTER — Telehealth: Payer: Self-pay | Admitting: Family Medicine

## 2023-02-26 DIAGNOSIS — J302 Other seasonal allergic rhinitis: Secondary | ICD-10-CM

## 2023-02-26 DIAGNOSIS — K219 Gastro-esophageal reflux disease without esophagitis: Secondary | ICD-10-CM | POA: Diagnosis not present

## 2023-02-26 DIAGNOSIS — Z48812 Encounter for surgical aftercare following surgery on the circulatory system: Secondary | ICD-10-CM | POA: Diagnosis not present

## 2023-02-26 DIAGNOSIS — Z794 Long term (current) use of insulin: Secondary | ICD-10-CM | POA: Diagnosis not present

## 2023-02-26 DIAGNOSIS — I25110 Atherosclerotic heart disease of native coronary artery with unstable angina pectoris: Secondary | ICD-10-CM | POA: Diagnosis not present

## 2023-02-26 DIAGNOSIS — E785 Hyperlipidemia, unspecified: Secondary | ICD-10-CM | POA: Diagnosis not present

## 2023-02-26 DIAGNOSIS — I739 Peripheral vascular disease, unspecified: Secondary | ICD-10-CM | POA: Diagnosis not present

## 2023-02-26 DIAGNOSIS — I4719 Other supraventricular tachycardia: Secondary | ICD-10-CM | POA: Diagnosis not present

## 2023-02-26 DIAGNOSIS — I119 Hypertensive heart disease without heart failure: Secondary | ICD-10-CM | POA: Diagnosis not present

## 2023-02-26 DIAGNOSIS — Z7985 Long-term (current) use of injectable non-insulin antidiabetic drugs: Secondary | ICD-10-CM | POA: Diagnosis not present

## 2023-02-26 DIAGNOSIS — E1165 Type 2 diabetes mellitus with hyperglycemia: Secondary | ICD-10-CM | POA: Diagnosis not present

## 2023-02-26 NOTE — Telephone Encounter (Signed)
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

## 2023-02-27 NOTE — Telephone Encounter (Signed)
Patient aware and verbalized understanding. °

## 2023-02-27 NOTE — Telephone Encounter (Signed)
Please advise 

## 2023-02-27 NOTE — Telephone Encounter (Signed)
Eat snacks every 2-3 hours if she doesn't feel like eating much. Let me know if hypoglycemia events continue. Discontinue benadryl. I can send in xyzal if she has allergy symptoms.

## 2023-03-01 ENCOUNTER — Ambulatory Visit (INDEPENDENT_AMBULATORY_CARE_PROVIDER_SITE_OTHER): Payer: Self-pay | Admitting: Thoracic Surgery (Cardiothoracic Vascular Surgery)

## 2023-03-01 DIAGNOSIS — Z951 Presence of aortocoronary bypass graft: Secondary | ICD-10-CM

## 2023-03-01 MED FILL — Sodium Chloride IV Soln 0.9%: INTRAVENOUS | Qty: 2000 | Status: AC

## 2023-03-01 MED FILL — Electrolyte-R (PH 7.4) Solution: INTRAVENOUS | Qty: 3000 | Status: AC

## 2023-03-01 NOTE — Progress Notes (Signed)
     [address]       [place] [address]             [phone]       Patient: Home Provider: Office Consent for Telemedicine visit obtained.  Today's visit was completed via a real-time telehealth (see specific modality noted below). The patient/authorized person provided oral consent at the time of the visit to engage in a telemedicine encounter with the present provider at [facility]. The patient/authorized person was informed of the potential benefits, limitations, and risks of telemedicine. The patient/authorized person expressed understanding that the laws that protect confidentiality also apply to telemedicine. The patient/authorized person acknowledged understanding that telemedicine does not provide emergency services and that he or she would need to call 911 or proceed to the nearest hospital for help if such a need arose.   Total time spent in the clinical discussion 10 minutes.  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  Overall doing well.  Pain is minimal.  Ambulating well. Vitals have been stable.  [name] will see [name] back in 1 month with a chest x-ray for cardiac rehab clearance.  [name]

## 2023-03-05 NOTE — Telephone Encounter (Signed)
Patient would like xyzal sent into pharmacy.

## 2023-03-06 MED ORDER — LEVOCETIRIZINE DIHYDROCHLORIDE 5 MG PO TABS
2.5000 mg | ORAL_TABLET | Freq: Every evening | ORAL | 3 refills | Status: DC
Start: 2023-03-06 — End: 2024-01-14

## 2023-03-06 NOTE — Addendum Note (Signed)
Addended by: Gabriel Earing on: 03/06/2023 08:00 AM   Modules accepted: Orders

## 2023-03-07 ENCOUNTER — Ambulatory Visit: Payer: PPO | Admitting: Family Medicine

## 2023-03-11 ENCOUNTER — Telehealth: Payer: Self-pay

## 2023-03-11 NOTE — Telephone Encounter (Signed)
[name], PT with [facility] contacted the office requesting verbal orders for an extension of her PT Home Health for 1x week/ 2 weeks due to scheduling conflicts and appointments. Advised Dr. Cliffton Asters would sign approval. Will await faxed copy for physician signature.

## 2023-03-11 NOTE — Progress Notes (Signed)
Cardiology Clinic Note   Patient Name: [name] Date of Encounter: 03/18/2023  Primary Care Provider:  Gabriel Earing, FNP Primary Cardiologist:  Rollene Rotunda, MD  Patient Profile    72 year old female with history of CAD, diabetes, and hyperlipidemia, with multiple interventions to her LAD and diagonal vessel as far back as 2008.  Most recent intervention July 2023 where she had DES placed to her circumflex.  Repeat cardiac cath January 2024 revealing severe in-stent restenosis treated with angioplasty and medically managed.  Readmitted to the hospital on 02/12/2023 due to recurrent unstable angina, with new anterior infarct.  Repeat cardiac catheterization revealed significant LAD disease and she was planned for CABG.  Patient had CABG x 1 utilizing LIMA to LAD on 02/19/2023.  Past Medical History    Past Medical History:  Diagnosis Date   Anxiety    CAD (coronary artery disease)    DES to circumflex 02/2007, BMS to LAD and PTCA diagonal 03/2007   Carotid artery plaque    Mild   Cataract    Depression    Diverticulitis, colon    Elevated d-dimer 01/08/2014   Essential hypertension, benign    GERD (gastroesophageal reflux disease)    H/O hiatal hernia    HLD (hyperlipidemia)    IDDM (insulin dependent diabetes mellitus)    Migraine    "used to have them really bad; don't have them anymore" (01/07/2014)   MS (multiple sclerosis) (HCC)    Not confirmed   PAT (paroxysmal atrial tachycardia)    Prolapse of uterus    PVD (peripheral vascular disease) (HCC)    TIA (transient ischemic attack) 1980's   Past Surgical History:  Procedure Laterality Date   ABDOMINAL HYSTERECTOMY  1986   ovaries remain - prolaspe uterus    APPENDECTOMY  ~ 1970   BREAST BIOPSY Right 1980's   BREAST LUMPECTOMY Right 1980's   Dr. Luan Moore    CARDIAC CATHETERIZATION  01/07/2014   CHOLECYSTECTOMY  ?1987   COLONOSCOPY  2002   Dr. Anwar--> Severe diverticular changes in the region of the sigmoid  and descending colon with scattered diverticular changes throughout the rest of the colon. No polyps, ulcerations. Despite numerous manipulations, the tip of the scope could not be tipped into the cecal area.   COLONOSCOPY  01/10/2012   Procedure: COLONOSCOPY;  Surgeon: Corbin Ade, MD;  Location: [facility];  Service: Endoscopy;  Laterality: N/A;  1:55   CORONARY ANGIOPLASTY WITH STENT PLACEMENT  ~ 1997 X 2   "2 + 1"   CORONARY ARTERY BYPASS GRAFT N/A 02/19/2023   Procedure: OFF PUMP CORONARY ARTERY BYPASS GRAFTING (CABG) X 1;  Surgeon: Corliss Skains, MD;  Location: [facility] OR;  Service: Open Heart Surgery;  Laterality: N/A;  LIMA TO LAD   CORONARY BALLOON ANGIOPLASTY N/A 10/05/2022   Procedure: CORONARY BALLOON ANGIOPLASTY;  Surgeon: Tonny Bollman, MD;  Location: [facility] INVASIVE CV LAB;  Service: Cardiovascular;  Laterality: N/A;   CORONARY PRESSURE/FFR STUDY N/A 03/08/2017   Procedure: Intravascular Pressure Wire/FFR Study;  Surgeon: Yvonne Kendall, MD;  Location: [facility] INVASIVE CV LAB;  Service: Cardiovascular;  Laterality: N/A;   CORONARY STENT INTERVENTION N/A 03/26/2022   Procedure: CORONARY STENT INTERVENTION;  Surgeon: Runell Gess, MD;  Location: [facility] INVASIVE CV LAB;  Service: Cardiovascular;  Laterality: N/A;   EYE SURGERY Bilateral 2014   cataract   LEFT HEART CATH AND CORONARY ANGIOGRAPHY N/A 03/08/2017   Procedure: Left Heart Cath and Coronary Angiography;  Surgeon:  End, Cristal Deer, MD;  Location: [facility] INVASIVE CV LAB;  Service: Cardiovascular;  Laterality: N/A;   LEFT HEART CATH AND CORONARY ANGIOGRAPHY N/A 03/26/2022   Procedure: LEFT HEART CATH AND CORONARY ANGIOGRAPHY;  Surgeon: Runell Gess, MD;  Location: [facility] INVASIVE CV LAB;  Service: Cardiovascular;  Laterality: N/A;   LEFT HEART CATH AND CORONARY ANGIOGRAPHY N/A 10/05/2022   Procedure: LEFT HEART CATH AND CORONARY ANGIOGRAPHY;  Surgeon: Tonny Bollman, MD;  Location: [facility] INVASIVE CV LAB;  Service: Cardiovascular;   Laterality: N/A;   LEFT HEART CATH AND CORONARY ANGIOGRAPHY N/A 02/14/2023   Procedure: LEFT HEART CATH AND CORONARY ANGIOGRAPHY;  Surgeon: Swaziland, Peter M, MD;  Location: [facility] INVASIVE CV LAB;  Service: Cardiovascular;  Laterality: N/A;   LEFT HEART CATHETERIZATION WITH CORONARY ANGIOGRAM N/A 01/07/2014   Procedure: LEFT HEART CATHETERIZATION WITH CORONARY ANGIOGRAM;  Surgeon: Laurey Morale, MD;  Location: [facility] CATH LAB;  Service: Cardiovascular;  Laterality: N/A;   TEE WITHOUT CARDIOVERSION N/A 02/19/2023   Procedure: TRANSESOPHAGEAL ECHOCARDIOGRAM;  Surgeon: Corliss Skains, MD;  Location: [facility] OR;  Service: Open Heart Surgery;  Laterality: N/A;    Allergies  Allergies  Allergen Reactions   Iohexol      Desc: pt had syncopal episode with nausea post IV CM late 1990's,  pt has had prednisone prep with heart caths x 2 without problem  kdean 04/16/07, Onset Date: [id number]    Ticlid [Ticlopidine Hcl] Nausea And Vomiting   Jardiance [Empagliflozin] Other (See Comments)    Recurrent UTIs   Metformin And Related Diarrhea   Codeine Nausea And Vomiting and Palpitations    History of Present Illness    [name] is here for [facility] visit status post CABG with LIMA to LAD per Dr. Cliffton Asters on 02/17/2023, and stenting of severe LAD disease, with history of multiple PCI and intervention to LAD, circumflex, since 2008 and new anterior MI.   She comes today complaining of positional dizziness.  She states oftentimes when she stands up she feels very lightheaded and has to sit down.  Other times she does not feel this is much.  However, it is happening more often than not.  She is anxious to move forward with her activities but has not yet been released to drive or begin more active lifestyle again.  She does use a walker sometimes for stability due to the dizziness.  She is due to follow-up with Dr. Cliffton Asters on Tuesday, July 9 at 3 PM to be released from surgery care to proceed with driving or other  activities.  She cannot get to cardiac rehab as she said it is too far for her to drive, and she does not want to burden her family as they are working.  She denies any chest pain, dyspnea on exertion, palpitations, positional chest discomfort postoperatively.,  Home Medications    Current Outpatient Medications  Medication Sig Dispense Refill   acetaminophen (TYLENOL) 500 MG tablet Take 2 tablets (1,000 mg total) by mouth every 6 (six) hours as needed. 30 tablet 0   alendronate (FOSAMAX) 70 MG tablet Take 1 tablet (70 mg total) by mouth every 7 (seven) days. Take with a full glass of water on an empty stomach. 12 tablet 3   amiodarone (PACERONE) 200 MG tablet Take 1 tablet (200 mg total) by mouth 2 (two) times daily. X 7 days, then decrease to 200 mg daily 60 tablet 1   aspirin 81 MG chewable tablet Chew 1 tablet (81 mg total) by mouth  daily. 90 tablet 3   clopidogrel (PLAVIX) 75 MG tablet Take 1 tablet (75 mg total) by mouth daily with breakfast. 90 tablet 3   Continuous Blood Gluc Sensor (FREESTYLE LIBRE 3 SENSOR) MISC Place 1 sensor on the skin every 14 days. Use to check glucose continuously. DX:e11.65 2 each 5   diphenhydrAMINE (BENADRYL) 25 mg capsule Take 25 mg by mouth every 6 (six) hours as needed for allergies.     furosemide (LASIX) 40 MG tablet Take 1 tablet (40 mg total) by mouth daily. 90 tablet 3   insulin aspart (NOVOLOG FLEXPEN) 100 UNIT/ML FlexPen Inject 36 Units into the skin 3 (three) times daily with meals. (Patient taking differently: Inject 25-36 Units into the skin 3 (three) times daily with meals.)     insulin degludec (TRESIBA FLEXTOUCH) 100 UNIT/ML FlexTouch Pen Inject 60 Units into the skin daily.     levocetirizine (XYZAL) 5 MG tablet Take 0.5 tablets (2.5 mg total) by mouth every evening. 90 tablet 3   lisinopril (ZESTRIL) 10 MG tablet Take 1 tablet (10 mg total) by mouth daily. 90 tablet 3   metoprolol tartrate (LOPRESSOR) 25 MG tablet Take 1 tablet (25 mg total)  by mouth 2 (two) times daily. 60 tablet 3   nitroGLYCERIN (NITROSTAT) 0.4 MG SL tablet DISSOLVE 1 TABLET UNDER TONGUE FOR CHESTPAIN.MAY REPEAT EVERY 5 MINUTES FOR 3 DOSES.IF NO RELIEF CALL 911 OR GO TO ER 25 tablet 3   potassium chloride (KLOR-CON M) 10 MEQ tablet Take 1 tablet (10 mEq total) by mouth 2 (two) times daily. 80 tablet 1   rOPINIRole (REQUIP) 0.5 MG tablet Take 2 tablets (1 mg total) by mouth daily as needed (restless leg).     rosuvastatin (CRESTOR) 20 MG tablet TAKE ONE (1) TABLET BY MOUTH EVERY DAY 90 tablet 0   Semaglutide, 1 MG/DOSE, (OZEMPIC, 1 MG/DOSE,) 2 MG/1.5ML SOPN Inject 2 mg into the skin once a week.     traZODone (DESYREL) 100 MG tablet Take 1 tablet (100 mg total) by mouth at bedtime as needed for sleep. 90 tablet 1   No current facility-administered medications for this visit.     Family History    Family History  Problem Relation Age of Onset   Heart attack Mother 64   Diabetes Mother    Hypertension Mother    Heart attack Father 4   Heart attack Brother 32       x 6   Heart disease Brother    Diabetes Brother    Colon cancer Paternal Aunt        49s, died with brain anuerysm   Crohn's disease Cousin        paternal   Diabetes Sister    GER disease Daughter    Cervical cancer Daughter    Diabetes Daughter    She indicated that her mother is deceased. She indicated that her father is deceased. She indicated that her sister is alive. She indicated that her brother is alive. She indicated that both of her daughters are alive. She indicated that both of her sons are alive. She indicated that her paternal aunt is deceased. She indicated that the status of her cousin is unknown.  Social History    Social History   Socioeconomic History   Marital status: Widowed    Spouse name: Not on file   Number of children: 4   Years of education: 40   Highest education level: 11th grade  Occupational History   Occupation:  Disability    Employer: DISABLED   Tobacco Use   Smoking status: Never   Smokeless tobacco: Never   Tobacco comments:    spouse, 41 years - husband has quit 01/2011  Vaping Use   Vaping Use: Never used  Substance and Sexual Activity   Alcohol use: No   Drug use: No   Sexual activity: Not Currently  Other Topics Concern   Not on file  Social History Narrative   Lives alone, one level, handicap accessible bathroom, her children all live nearby   Social Determinants of Health   Financial Resource Strain: Low Risk  (01/24/2023)   Overall Financial Resource Strain (CARDIA)    Difficulty of Paying Living Expenses: Not hard at all  Food Insecurity: No Food Insecurity (02/14/2023)   Hunger Vital Sign    Worried About Running Out of Food in the Last Year: Never true    Ran Out of Food in the Last Year: Never true  Transportation Needs: No Transportation Needs (02/14/2023)   PRAPARE - Administrator, Civil Service (Medical): No    Lack of Transportation (Non-Medical): No  Physical Activity: Insufficiently Active (01/24/2023)   Exercise Vital Sign    Days of Exercise per Week: 3 days    Minutes of Exercise per Session: 30 min  Stress: No Stress Concern Present (01/24/2023)   [employer] of Occupational Health - Occupational Stress Questionnaire    Feeling of Stress : Not at all  Social Connections: Moderately Integrated (01/24/2023)   Social Connection and Isolation Panel [NHANES]    Frequency of Communication with Friends and Family: More than three times a week    Frequency of Social Gatherings with Friends and Family: More than three times a week    Attends Religious Services: More than 4 times per year    Active Member of [employer] or Organizations: No    Attends Banker Meetings: Never    Marital Status: Married  Catering manager Violence: Not At Risk (02/14/2023)   Humiliation, Afraid, Rape, and Kick questionnaire    Fear of Current or Ex-Partner: No    Emotionally Abused: No    Physically  Abused: No    Sexually Abused: No     Review of Systems    General:  No chills, fever, night sweats or weight changes.  Cardiovascular:  No chest pain, dyspnea on exertion, edema, orthopnea, palpitations, paroxysmal nocturnal dyspnea. Dermatological: No rash, lesions/masses Respiratory: No cough, dyspnea Urologic: No hematuria, dysuria Abdominal:   No nausea, vomiting, diarrhea, bright red blood per rectum, melena, or hematemesis Neurologic:  No visual changes, wkns, changes in mental status. All other systems reviewed and are otherwise negative except as noted above.    Cardiac Rehabilitation Eligibility Assessment      Physical Exam    VS:  BP 126/70 (BP Location: Left Arm, Patient Position: Sitting, Cuff Size: Normal)   Pulse 78   Ht 5\' 6"  (1.676 m)   Wt 147 lb (66.7 kg)   BMI 23.73 kg/m  , BMI Body mass index is 23.73 kg/m.     GEN: Well nourished, well developed, in no acute distress. HEENT: normal. Neck: Supple, no JVD, carotid bruits, or masses. Cardiac: RRR, distant heart sounds, no murmurs, rubs, or gallops. No clubbing, cyanosis, edema.  Radials/DP/PT 2+ and equal bilaterally.  Respiratory:  Respirations regular and unlabored, clear to auscultation bilaterally. GI: Soft, nontender, nondistended, BS + x 4. MS: no deformity or atrophy. Skin: warm and dry, no  rash. Neuro:  Strength and sensation are intact. Psych: Normal affect.  Accessory Clinical Findings  EKG: (Personally reviewed).  Normal sinus rhythm heart rate 78 bpm with first-degree AV block PR interval 212 ms, anterior infarct age undetermined.  Lab Results  Component Value Date   WBC 11.0 (H) 02/22/2023   HGB 11.5 (L) 02/22/2023   HCT 34.4 (L) 02/22/2023   MCV 92.0 02/22/2023   PLT 143 (L) 02/22/2023   Lab Results  Component Value Date   CREATININE 1.14 (H) 02/22/2023   BUN 17 02/22/2023   NA 137 02/22/2023   K 4.4 02/22/2023   CL 103 02/22/2023   CO2 23 02/22/2023   Lab Results   Component Value Date   ALT 14 02/14/2023   AST 28 02/14/2023   ALKPHOS 46 02/14/2023   BILITOT 0.6 02/14/2023   Lab Results  Component Value Date   CHOL 105 02/13/2023   HDL 39 (L) 02/13/2023   LDLCALC 50 02/13/2023   LDLDIRECT 93 04/06/2015   TRIG 81 02/13/2023   CHOLHDL 2.7 02/13/2023    Lab Results  Component Value Date   HGBA1C 7.8 (H) 02/13/2023    Review of Prior Studies: [facility] 02/14/2023  Prox LAD to Mid LAD lesion is 99% stenosed.   1st Diag lesion is 50% stenosed.   Prox Cx to Mid Cx lesion is 25% stenosed.   Mid Cx lesion is 25% stenosed.   Prox RCA to Mid RCA lesion is 25% stenosed.   Non-stenotic Mid LAD lesion was previously treated.   The left ventricular systolic function is normal.   LV end diastolic pressure is normal.   The left ventricular ejection fraction is 55-65% by visual estimate.   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 The vessel has 2 layers of stent in this area. It has previously been treated with high pressure [place]ill balloon dilation 4 months ago. Normal LV function Normal LVEDP  Echocardiogram 02/14/2023 1. Left ventricular ejection fraction, by estimation, is 60 to 65%. The  left ventricle has normal function. The left ventricle has no regional  wall motion abnormalities. There is mild concentric left ventricular  hypertrophy. Left ventricular diastolic  parameters are consistent with Grade I diastolic dysfunction (impaired  relaxation).   2. Peak RV-RA gradient 30 mmHg. IVC not visualized. Right ventricular  systolic function is normal. The right ventricular size is mildly  enlarged.   3. The mitral valve is abnormal. Trivial mitral valve regurgitation. Mild  mitral stenosis. The mean mitral valve gradient is 5.0 mmHg. Moderate  mitral annular calcification.   4. The aortic valve is tricuspid. There is mild calcification of the  aortic valve. Aortic valve regurgitation is not visualized. No aortic  stenosis is present.      Assessment & Plan   1.  Coronary artery disease:  Status post CABG x 1 (LIMA to LAD with Dr. Cliffton Asters.  Prior multiple interventions with stents to the LAD and PTCA of the diagonal . She has had had telephone visit with Dr. Cliffton Asters office but has not been formally released to proceed with driving again or cardiac rehab.  The patient does not wish to go to cardiac rehab as she says it is too far and she does not want to burden her family.  She remains on amiodarone, furosemide, lisinopril, and metoprolol.  Due to chest pain but she does have generalized fatigue and she has not been very active.  2.  Lightheadedness and dizziness: We have done orthostatics today  and her blood pressure did drop from 130/76-117/44.  I am going to decrease her Lasix from 40 mg daily to 20 mg daily.  If she continues to feel lightheaded and dizzy would probably need to discontinue this altogether.  She should report any further dizziness to Dr. Cliffton Asters.  3.  Hypertension: As discussed above.  On lisinopril and metoprolol along with Lasix.  Adjustments of Lasix to titrate down as discussed above.  4.  Hypercholesterolemia: Remains on rosuvastatin 20 mg daily.  Goal of LDL less than 50 with CABG and diabetes.    Cardiac Rehabilitation Eligibility Assessment         Signed, Bettey Mare. Liborio Nixon, ANP, AACC   03/18/2023 11:52 AM      Office [phone] Fax [phone]  Notice: This dictation was prepared with Dragon dictation along with smaller phrase technology. Any transcriptional errors that result from this process are unintentional and may not be corrected upon review.

## 2023-03-12 ENCOUNTER — Ambulatory Visit: Payer: PPO | Admitting: Pharmacist

## 2023-03-12 ENCOUNTER — Encounter: Payer: Self-pay | Admitting: Pharmacist

## 2023-03-12 ENCOUNTER — Other Ambulatory Visit: Payer: Self-pay

## 2023-03-12 VITALS — BP 130/74 | HR 65

## 2023-03-12 DIAGNOSIS — N6310 Unspecified lump in the right breast, unspecified quadrant: Secondary | ICD-10-CM

## 2023-03-12 DIAGNOSIS — N1831 Chronic kidney disease, stage 3a: Secondary | ICD-10-CM

## 2023-03-12 DIAGNOSIS — E1165 Type 2 diabetes mellitus with hyperglycemia: Secondary | ICD-10-CM

## 2023-03-12 NOTE — Progress Notes (Signed)
FBG 100-130 BOOST PROTEIN DRINKS AMI 36 UNITS  NOVOLOG 22 UNITS

## 2023-03-12 NOTE — Progress Notes (Signed)
03/12/2023 Name: [name] MRN: [id number] DOB: 1951/09/16  Chief Complaint  Patient presents with   Chronic Care Management   Diabetes    [name] is a 72 y.o. year old female who presented for a telephone visit.   They were referred to the pharmacist by their PCP for assistance in managing diabetes.   Subjective:   Medication Access/Adherence  Current [facility]acy:  [facility], [place] - [address] [place] [address] Phone: [phone] Fax: [phone]   Patient reports affordability concerns with their medications: Yes  Patient reports access/transportation concerns to their pharmacy: No  Patient reports adherence concerns with their medications:  Yes  sliding scale    Objective:  Lab Results  Component Value Date   HGBA1C 7.8 (H) 02/13/2023    Lab Results  Component Value Date   CREATININE 1.14 (H) 02/22/2023   BUN 17 02/22/2023   NA 137 02/22/2023   K 4.4 02/22/2023   CL 103 02/22/2023   CO2 23 02/22/2023    Lab Results  Component Value Date   CHOL 105 02/13/2023   HDL 39 (L) 02/13/2023   LDLCALC 50 02/13/2023   LDLDIRECT 93 04/06/2015   TRIG 81 02/13/2023   CHOLHDL 2.7 02/13/2023    Medications Reviewed Today     Reviewed by Danella Maiers, [facility] (Pharmacist) on 03/12/23 at 0858  Med List Status: <None>   Medication Order Taking? Sig Documenting Provider Last Dose Status Informant  acetaminophen (TYLENOL) 500 MG tablet [id number] No Take 2 tablets (1,000 mg total) by mouth every 6 (six) hours as needed. Barrett, Rae Roam, PA-C Taking Active   alendronate (FOSAMAX) 70 MG tablet [id number] No Take 1 tablet (70 mg total) by mouth every 7 (seven) days. Take with a full glass of water on an empty stomach. Gabriel Earing, FNP Taking Active Self, Pharmacy Records  amiodarone (PACERONE) 200 MG tablet [id number] No Take 1 tablet (200 mg total) by mouth 2 (two) times daily. X 7 days, then decrease to 200 mg daily  Barrett, Erin R, PA-C Taking Active   aspirin 81 MG chewable tablet [id number] No Chew 1 tablet (81 mg total) by mouth daily. Cathren Harsh, MD Taking Active Self, Pharmacy Records  clopidogrel (PLAVIX) 75 MG tablet [id number] No Take 1 tablet (75 mg total) by mouth daily with breakfast. Rai, Delene Ruffini, MD Taking Active Self, Pharmacy Records           Med Note Mayford Knife, [place]   Tue Feb 12, 2023  5:28 PM) Pt states she does not remember for sure she thinks it was a medication that she was going to take when she went back home.  Continuous Blood Gluc Sensor (FREESTYLE LIBRE 3 SENSOR) MISC [id number] No Place 1 sensor on the skin every 14 days. Use to check glucose continuously. DX:e11.65 Karen Spencer, FNP Taking Active Self, Pharmacy Records  diphenhydrAMINE (BENADRYL) 25 mg capsule [id number] No Take 25 mg by mouth every 6 (six) hours as needed for allergies. [provider] Unknown Active Self, Pharmacy Records  furosemide (LASIX) 40 MG tablet [id number] No Take 1 tablet (40 mg total) by mouth daily. Gabriel Earing, FNP Taking Active Self, Pharmacy Records  insulin aspart (NOVOLOG FLEXPEN) 100 UNIT/ML FlexPen [id number] No Inject 36 Units into the skin 3 (three) times daily with meals.  Patient taking differently: Inject 25-36 Units into the skin 3 (three) times daily with meals.  Joseph Art, DO Taking Active Self, Pharmacy Records           Med Note [facility], [name]   Tue Nov 13, 2022  9:16 AM) [name] as 30-40 units BID  insulin degludec (TRESIBA FLEXTOUCH) 100 UNIT/ML FlexTouch Pen [id number] No Inject 60 Units into the skin daily. [provider] Taking Active Self, Pharmacy Records           Med Note Vance Gather, [name]   Tue Nov 13, 2022  9:17 AM) Per pt, takes 36 units  levocetirizine (XYZAL) 5 MG tablet [id number]  Take 0.5 tablets (2.5 mg total) by mouth every evening. Gabriel Earing, FNP  Active   lisinopril (ZESTRIL) 10 MG tablet [id number] No Take 1 tablet  (10 mg total) by mouth daily. Rollene Rotunda, MD Taking Active Self, Pharmacy Records  metoprolol tartrate (LOPRESSOR) 25 MG tablet [id number] No Take 1 tablet (25 mg total) by mouth 2 (two) times daily. Barrett, Erin R, PA-C Taking Active   nitroGLYCERIN (NITROSTAT) 0.4 MG SL tablet [id number] No DISSOLVE 1 TABLET UNDER TONGUE FOR CHESTPAIN.MAY REPEAT EVERY 5 MINUTES FOR 3 DOSES.IF NO RELIEF CALL 911 OR GO TO ER Rai, Delene Ruffini, MD Unknown Active Self, Pharmacy Records  potassium chloride (KLOR-CON M) 10 MEQ tablet [id number] No Take 1 tablet (10 mEq total) by mouth 2 (two) times daily.  Patient not taking: Reported on 02/12/2023   Gabriel Earing, FNP Not Taking Active Self, Pharmacy Records           Med Note Mayford Knife, [place] Feb 12, 2023  5:31 PM) Pt states Doctor wants her to start again   rOPINIRole (REQUIP) 0.5 MG tablet [id number] No Take 2 tablets (1 mg total) by mouth daily as needed (restless leg). Joseph Art, DO Unknown Active Self, Pharmacy Records  rosuvastatin (CRESTOR) 20 MG tablet [id number] No TAKE ONE (1) TABLET BY MOUTH EVERY DAY Gabriel Earing, FNP Taking Active Self, Pharmacy Records  Semaglutide, 1 MG/DOSE, (OZEMPIC, 1 MG/DOSE,) 2 MG/1.5ML SOPN [id number] No Inject 2 mg into the skin once a week. [provider] Taking Active Self, Pharmacy Records           Med Note Donn Pierini Feb 07, 2023 10:58 AM) Via novo nordisk patient assistance program Takes on Thursdays   traZODone (DESYREL) 100 MG tablet [id number] No Take 1 tablet (100 mg total) by mouth at bedtime as needed for sleep. Gabriel Earing, FNP Taking Active Self, Pharmacy Records  Med List Note Gabriel Earing, [facility] 08/09/21 1015): nov            Assessment/Plan:   Diabetes: Goal on track: NO, but much improved! Patient [name]/p open heart on 02/12/23 Uncontrolled -A1c 8.5-->7.8% variable BG readings; FBG running higher up to 200/post prandials up to 200 GFR 59-->52 CKD 3a Patient  cancelled endocrine appt due to cost; she is unable to afford specialist copay and would not be able to incur additional charges (insulin pump, etc) Past intolerances: metformin, dapagliflozin (Farxiga)--SGLT2s Current treatment: Tresiba 60 units, Novolog 20 units 2x w/ meals (only eats 2 meals daily), Ozempic 2mg  weekly;  PLAN: Continue Tresiba 50-60 units every morning, Novolog 20-30 units with meals (2x daily, only eats 2 meals), Ozempic 2mg  weekly Reinforced the importance of giving appropriate sliding scale amounts of insulin; if blood sugar does not come down with current amount, encouraged patient to reach out so we can adjust Denies personal and family  history of Medullary thyroid cancer (MTC) patient assistance with novo nordisk 2024 (tresiba, novolog, ozempic) Now using [place] 3 CGM (HTA medicare okay to send to local drug store) denies hyper/hypoglycemia (previously had) Discussed meal planning options and Plate method for healthy eating Avoid sugary drinks and desserts Incorporate balanced protein, non starchy veggies, 1 serving of carbohydrate with each meal Increase water intake Increase physical activity as able Discussed FOLLOWING A HEART HEALTHY DIET/HEALTHY PLATE METHOD Lipids currently controlled on rosuvastatin 40mg        Follow Up Plan: 2 months  Kieth Brightly, PharmD, BCACP Clinical Pharmacist, [facility]

## 2023-03-13 ENCOUNTER — Other Ambulatory Visit: Payer: Self-pay | Admitting: Family Medicine

## 2023-03-13 ENCOUNTER — Telehealth: Payer: Self-pay | Admitting: Family Medicine

## 2023-03-13 DIAGNOSIS — E1169 Type 2 diabetes mellitus with other specified complication: Secondary | ICD-10-CM

## 2023-03-13 NOTE — Telephone Encounter (Signed)
Informed patient we do not have sensor.  She has already purchased one from pharmacy and called company for a replacement also.

## 2023-03-18 ENCOUNTER — Ambulatory Visit: Payer: PPO | Attending: Adult Health | Admitting: Adult Health

## 2023-03-18 ENCOUNTER — Encounter: Payer: Self-pay | Admitting: Adult Health

## 2023-03-18 VITALS — BP 130/76 | HR 82 | Ht 66.0 in | Wt 147.0 lb

## 2023-03-18 DIAGNOSIS — I509 Heart failure, unspecified: Secondary | ICD-10-CM | POA: Diagnosis not present

## 2023-03-18 DIAGNOSIS — R002 Palpitations: Secondary | ICD-10-CM

## 2023-03-18 NOTE — Patient Instructions (Signed)
Medication Instructions:  Decrease Lasix from 40 mg to 20 mg ( Take 1 Tablet Daliy). *If you need a refill on your cardiac medications before your next appointment, please call your pharmacy*   Lab Work: No Labs If you have labs (blood work) drawn today and your tests are completely normal, you will receive your results only by: MyChart Message (if you have MyChart) OR A paper copy in the mail If you have any lab test that is abnormal or we need to change your treatment, we will call you to review the results.   Testing/Procedures: No Testing   Follow-Up: At [facility], you and your health needs are our priority.  As part of our continuing mission to provide you with exceptional heart care, we have created designated Provider Care Teams.  These Care Teams include your primary Cardiologist (physician) and Advanced Practice Providers (APPs -  Physician Assistants and Nurse Practitioners) who all work together to provide you with the care you need, when you need it.  We recommend signing up for the patient portal called "MyChart".  Sign up information is provided on this After Visit Summary.  MyChart is used to connect with patients for Virtual Visits (Telemedicine).  Patients are able to view lab/test results, encounter notes, upcoming appointments, etc.  Non-urgent messages can be sent to your provider as well.   To learn more about what you can do with MyChart, go to [email or web address].    Your next appointment:   3 month(s)  Provider:   Joni Reining, DNP, ANP

## 2023-03-18 NOTE — Progress Notes (Signed)
Hypotensive at baseline.

## 2023-03-20 ENCOUNTER — Other Ambulatory Visit: Payer: Self-pay | Admitting: Thoracic Surgery (Cardiothoracic Vascular Surgery)

## 2023-03-20 DIAGNOSIS — Z951 Presence of aortocoronary bypass graft: Secondary | ICD-10-CM

## 2023-03-23 ENCOUNTER — Telehealth: Payer: Self-pay | Admitting: Thoracic Surgery (Cardiothoracic Vascular Surgery)

## 2023-03-23 NOTE — Telephone Encounter (Signed)
Received call from HHPT to report a 2 pound weight gain over past 24 hours  Instructed to monitor  Viviann Spare C. Dorris Fetch, MD [facility] [phone]

## 2023-03-29 ENCOUNTER — Telehealth: Payer: Self-pay

## 2023-03-29 NOTE — Telephone Encounter (Signed)
Charr[place], PT with [facility] contacted the office. Patient is being discharged from home health PT today. She states that patient is having issues with blood sugar and pressure regulation. She states that her blood sugar has been high/ normal to low normal as well. Advised that she needs to speak with her PCP and Cardiologist about these issues and possible f/u if warranted. She acknowledged receipt.

## 2023-04-01 NOTE — Progress Notes (Unsigned)
HPI: [name]. [name] is a 72 year old female with past history of coronary artery disease status post multiple percutaneous interventions to both the LAD and circumflex coronary arteries.  She also has a history of hypertension, dyslipidemia and type 2 diabetes mellitus.  She was admitted to the hospital back in May with unstable angina and ruled in for non-ST elevation myocardial infarction.  Left heart catheterization demonstrated restenosis of the LAD.  She was referred to [name] for consideration of CABG and later had single-vessel coronary bypass grafting with a left internal mammary artery to the left anterior descending coronary artery by Dr. Cliffton Asters on 02/19/2023.  He has been seen in follow-up by the cardiology team about 2 weeks ago and at that time was having some dizziness felt to be due to orthostatic hypotension.  Her Lasix was decreased from 40 mg daily to 20 mg daily.  She returns to our office for scheduled follow-up today.   Current Outpatient Medications  Medication Sig Dispense Refill   acetaminophen (TYLENOL) 500 MG tablet Take 2 tablets (1,000 mg total) by mouth every 6 (six) hours as needed. 30 tablet 0   alendronate (FOSAMAX) 70 MG tablet Take 1 tablet (70 mg total) by mouth every 7 (seven) days. Take with a full glass of water on an empty stomach. 12 tablet 3   amiodarone (PACERONE) 200 MG tablet Take 1 tablet (200 mg total) by mouth 2 (two) times daily. X 7 days, then decrease to 200 mg daily 60 tablet 1   aspirin 81 MG chewable tablet Chew 1 tablet (81 mg total) by mouth daily. 90 tablet 3   clopidogrel (PLAVIX) 75 MG tablet Take 1 tablet (75 mg total) by mouth daily with breakfast. 90 tablet 3   Continuous Blood Gluc Sensor (FREESTYLE LIBRE 3 SENSOR) MISC Place 1 sensor on the skin every 14 days. Use to check glucose continuously. DX:e11.65 2 each 5   diphenhydrAMINE (BENADRYL) 25 mg capsule Take 25 mg by mouth every 6 (six) hours as needed for allergies.     furosemide  (LASIX) 20 MG tablet Take 20 mg by mouth daily. Take 1 Tablet Daily     furosemide (LASIX) 40 MG tablet Take 1 tablet (40 mg total) by mouth daily. (Patient taking differently: Take 20 mg by mouth daily.) 90 tablet 3   insulin aspart (NOVOLOG FLEXPEN) 100 UNIT/ML FlexPen Inject 36 Units into the skin 3 (three) times daily with meals. (Patient taking differently: Inject 25-36 Units into the skin 3 (three) times daily with meals.)     insulin degludec (TRESIBA FLEXTOUCH) 100 UNIT/ML FlexTouch Pen Inject 60 Units into the skin daily.     levocetirizine (XYZAL) 5 MG tablet Take 0.5 tablets (2.5 mg total) by mouth every evening. 90 tablet 3   lisinopril (ZESTRIL) 10 MG tablet Take 1 tablet (10 mg total) by mouth daily. 90 tablet 3   metoprolol tartrate (LOPRESSOR) 25 MG tablet Take 1 tablet (25 mg total) by mouth 2 (two) times daily. 60 tablet 3   nitroGLYCERIN (NITROSTAT) 0.4 MG SL tablet DISSOLVE 1 TABLET UNDER TONGUE FOR CHESTPAIN.MAY REPEAT EVERY 5 MINUTES FOR 3 DOSES.IF NO RELIEF CALL 911 OR GO TO ER 25 tablet 3   potassium chloride (KLOR-CON M) 10 MEQ tablet Take 1 tablet (10 mEq total) by mouth 2 (two) times daily. 80 tablet 1   rOPINIRole (REQUIP) 0.5 MG tablet Take 2 tablets (1 mg total) by mouth daily as needed (restless leg).     rosuvastatin (CRESTOR)  20 MG tablet TAKE ONE (1) TABLET BY MOUTH EVERY DAY 90 tablet 0   Semaglutide, 1 MG/DOSE, (OZEMPIC, 1 MG/DOSE,) 2 MG/1.5ML SOPN Inject 2 mg into the skin once a week.     traZODone (DESYREL) 100 MG tablet Take 1 tablet (100 mg total) by mouth at bedtime as needed for sleep. 90 tablet 1   No current facility-administered medications for this visit.    Physical Exam: ***  Diagnostic Tests: ***  Impression: ***  Plan: ***   Leary Roca, PA-C [facility] [phone]

## 2023-04-02 ENCOUNTER — Ambulatory Visit (INDEPENDENT_AMBULATORY_CARE_PROVIDER_SITE_OTHER): Payer: Self-pay | Admitting: Physician Assistant

## 2023-04-02 ENCOUNTER — Ambulatory Visit
Admission: RE | Admit: 2023-04-02 | Discharge: 2023-04-02 | Disposition: A | Discharge status: Home / Self Care | Payer: PPO | Source: Ambulatory Visit | Attending: Thoracic Surgery (Cardiothoracic Vascular Surgery) | Admitting: Thoracic Surgery (Cardiothoracic Vascular Surgery)

## 2023-04-02 ENCOUNTER — Other Ambulatory Visit: Payer: Self-pay | Admitting: Thoracic Surgery (Cardiothoracic Vascular Surgery)

## 2023-04-02 VITALS — BP 111/69 | HR 85 | Resp 18 | Ht 66.0 in | Wt 148.0 lb

## 2023-04-02 DIAGNOSIS — Z951 Presence of aortocoronary bypass graft: Secondary | ICD-10-CM | POA: Diagnosis not present

## 2023-04-02 DIAGNOSIS — J90 Pleural effusion, not elsewhere classified: Secondary | ICD-10-CM | POA: Diagnosis not present

## 2023-04-02 MED ORDER — METOPROLOL TARTRATE 25 MG PO TABS: 12.5000 mg | ORAL_TABLET | Freq: Two times a day (BID) | ORAL | 3 refills | Status: DC

## 2023-04-02 NOTE — Patient Instructions (Signed)
You may stop taking the amiodarone.  Decrease the metoprolol to 1/2 tablet (12.5 mg) twice daily  You may resume driving as long as you are not having dizziness.  Recommend you have someone with you or if you trips as you resume driving.  Okay to proceed with cardiac rehab.  Continue to observe sternal precautions with no lifting greater than 15 pounds for another 2 weeks.  After that, you may resume activities without limitations.

## 2023-04-03 ENCOUNTER — Other Ambulatory Visit: Payer: Self-pay | Admitting: Family Medicine

## 2023-04-03 DIAGNOSIS — R600 Localized edema: Secondary | ICD-10-CM

## 2023-04-03 DIAGNOSIS — I509 Heart failure, unspecified: Secondary | ICD-10-CM

## 2023-04-19 ENCOUNTER — Ambulatory Visit (INDEPENDENT_AMBULATORY_CARE_PROVIDER_SITE_OTHER): Payer: PPO | Admitting: Pharmacist

## 2023-04-19 DIAGNOSIS — Z794 Long term (current) use of insulin: Secondary | ICD-10-CM

## 2023-04-19 DIAGNOSIS — E1165 Type 2 diabetes mellitus with hyperglycemia: Secondary | ICD-10-CM

## 2023-04-19 NOTE — Progress Notes (Signed)
04/19/2023 Name: [name] MRN: [id number] DOB: October 22, 1950  Chief Complaint  Patient presents with   Diabetes    [name] is a 72 y.o. year old female who presented for a telephone visit.   They were referred to the pharmacist by their PCP for assistance in managing diabetes, hyperlipidemia, and medication access.    Subjective:  Care Team: Primary Care Provider: Gabriel Earing, FNP ; Next Scheduled Visit: 05/08/23   Medication Access/Adherence  [facility], [place] - [address] [place] [address] Phone: [phone] Fax: [phone]  Patient reports affordability concerns with their medications: Yes  Patient reports access/transportation concerns to their pharmacy: No  Patient reports adherence concerns with their medications:  No     Diabetes:  Current treatment: Tresiba 60 units, Novolog 20 units 2x w/ meals (only eats 2 meals daily), Ozempic 2mg  weekly;  Past intolerances: metformin, Farxiga (SGLT2s), Invokana, Trulicity, Humalog   Current glucose readings: FBG<140 patient reports her blood sugars have never been better Using Libre 3 CGM; testing continuously; uses reader   -Patient is injecting insulin 4 or more times daily -She greatly benefits from her continuous glucose monitoring system (libre 3)  Patient denies hypoglycemic s/sx including dizziness, shakiness, sweating. Patient denies hyperglycemic symptoms including polyuria, polydipsia, polyphagia, nocturia, neuropathy, blurred vision.  Current meal patterns:  Discussed meal planning options and Plate method for healthy eating Avoid sugary drinks and desserts Incorporate balanced protein, non starchy veggies, 1 serving of carbohydrate with each meal Increase water intake Increase physical activity as able   Current physical activity: cardiac rehab  Current medication access support: novo nordisk PAP   Objective:  Lab Results   Component Value Date   HGBA1C 7.8 (H) 02/13/2023    Lab Results  Component Value Date   CREATININE 1.14 (H) 02/22/2023   BUN 17 02/22/2023   NA 137 02/22/2023   K 4.4 02/22/2023   CL 103 02/22/2023   CO2 23 02/22/2023    Lab Results  Component Value Date   CHOL 105 02/13/2023   HDL 39 (L) 02/13/2023   LDLCALC 50 02/13/2023   LDLDIRECT 93 04/06/2015   TRIG 81 02/13/2023   CHOLHDL 2.7 02/13/2023    Medications Reviewed Today     Reviewed by Danella Maiers, [facility] (Pharmacist) on 05/03/23 at 1248  Med List Status: <None>   Medication Order Taking? Sig Documenting Provider Last Dose Status Informant  acetaminophen (TYLENOL) 500 MG tablet [id number] No Take 2 tablets (1,000 mg total) by mouth every 6 (six) hours as needed. Barrett, Rae Roam, PA-C Taking Active   alendronate (FOSAMAX) 70 MG tablet [id number] No Take 1 tablet (70 mg total) by mouth every 7 (seven) days. Take with a full glass of water on an empty stomach. Gabriel Earing, FNP Taking Active Self, Pharmacy Records  aspirin 81 MG chewable tablet [id number] No Chew 1 tablet (81 mg total) by mouth daily. Cathren Harsh, MD Taking Active Self, Pharmacy Records  clopidogrel (PLAVIX) 75 MG tablet [id number] No Take 1 tablet (75 mg total) by mouth daily with breakfast. Rai, Delene Ruffini, MD Taking Active Self, Pharmacy Records           Med Note Mayford Knife, [place]   Tue Feb 12, 2023  5:28 PM) Pt states she does not remember for sure she thinks it was a medication that she was going to take when she went back home.  Continuous Blood Gluc Sensor (FREESTYLE LIBRE 3 SENSOR) MISC [id number] No Place 1 sensor on the skin every 14 days. Use to check glucose continuously. DX:e11.65 Junie Spencer, FNP Taking Active Self, Pharmacy Records  diphenhydrAMINE (BENADRYL) 25 mg capsule [id number] No Take 25 mg by mouth every 6 (six) hours as needed for allergies. [provider] Taking Active Self, Pharmacy Records  furosemide (LASIX) 20 MG  tablet [id number]  Take 1 tablet (20 mg total) by mouth daily. Gabriel Earing, FNP  Active   insulin aspart (NOVOLOG FLEXPEN) 100 UNIT/ML FlexPen [id number] No Inject 36 Units into the skin 3 (three) times daily with meals.  Patient taking differently: Inject 25-36 Units into the skin 3 (three) times daily with meals.   Joseph Art, DO Taking Active Self, Pharmacy Records           Med Note [facility], RACHEL S   Tue Nov 13, 2022  9:16 AM) Leanora Ivanoff as 30-40 units BID  insulin degludec (TRESIBA FLEXTOUCH) 100 UNIT/ML FlexTouch Pen [id number] No Inject 60 Units into the skin daily. [provider] Taking Active Self, Pharmacy Records           Med Note Cresenciano Genre, Lilla Shook   Fri May 03, 2023 12:39 PM)    levocetirizine (XYZAL) 5 MG tablet [id number] No Take 0.5 tablets (2.5 mg total) by mouth every evening. Gabriel Earing, FNP Taking Active   lisinopril (ZESTRIL) 10 MG tablet [id number] No Take 1 tablet (10 mg total) by mouth daily. Rollene Rotunda, MD Taking Active Self, Pharmacy Records  metoprolol tartrate (LOPRESSOR) 25 MG tablet [id number]  Take 0.5 tablets (12.5 mg total) by mouth 2 (two) times daily. Leary Roca, PA-C  Active   nitroGLYCERIN (NITROSTAT) 0.4 MG SL tablet [id number] No DISSOLVE 1 TABLET UNDER TONGUE FOR CHESTPAIN.MAY REPEAT EVERY 5 MINUTES FOR 3 DOSES.IF NO RELIEF CALL 911 OR GO TO ER Rai, Delene Ruffini, MD Taking Active Self, Pharmacy Records  potassium chloride (KLOR-CON M) 10 MEQ tablet [id number] No Take 1 tablet (10 mEq total) by mouth 2 (two) times daily. Gabriel Earing, FNP Taking Active Self, Pharmacy Records           Med Note Mayford Knife, [place]   Tue Feb 12, 2023  5:31 PM) Pt states Doctor wants her to start again   rOPINIRole (REQUIP) 0.5 MG tablet [id number] No Take 2 tablets (1 mg total) by mouth daily as needed (restless leg). Joseph Art, DO Taking Active Self, Pharmacy Records  rosuvastatin (CRESTOR) 20 MG tablet [id number] No TAKE ONE (1) TABLET BY  MOUTH EVERY DAY Gabriel Earing, FNP Taking Active   Semaglutide, 1 MG/DOSE, (OZEMPIC, 1 MG/DOSE,) 2 MG/1.5ML SOPN [id number] No Inject 2 mg into the skin once a week. [provider] Taking Active Self, Pharmacy Records           Med Note Donn Pierini Feb 07, 2023 10:58 AM) Via novo nordisk patient assistance program Takes on Thursdays   traZODone (DESYREL) 100 MG tablet [id number] No Take 1 tablet (100 mg total) by mouth at bedtime as needed for sleep. Gabriel Earing, FNP Taking Active Self, Pharmacy Records  Med List Note Gabriel Earing, [facility] 08/09/21 1015): nov             Assessment/Plan:   Diabetes: - Currently uncontrolled, but improving per patient report - Reviewed long term cardiovascular and renal outcomes of uncontrolled blood sugar--GFR 52 (01/2023) - Reviewed goal A1c,  goal fasting, and goal 2 hour post prandial glucose - Reviewed dietary modifications including FOLLOWING A HEART HEALTHY DIET/HEALTHY PLATE METHOD  The patient is asked to make an attempt to improve diet and exercise patterns to aid in medical management of this problem. - Recommend to :  Continue Ozempic 2mg  weekly--patient denies personal or family history of medullary thyroid carcinoma (MTC) or in patients with Multiple Endocrine Neoplasia syndrome type 2 (MEN 2) Continue Tresiba 50 units nightly Continue Novolog 30-32 with meals twice daily (only eats about 2 meals/day)  - Recommend to check glucose continuously using Libre 3 CGM -A1c ordered for future appt -per patient & progress notes, beta blocker dose was decreased due to orthostatic hypotension (following closely with cardiology)   Follow Up Plan: PCP 05/08/23; PharmD in 06/2023    Kieth Brightly, PharmD, BCACP Clinical Pharmacist, [facility]

## 2023-05-03 ENCOUNTER — Encounter: Payer: Self-pay | Admitting: Pharmacist

## 2023-05-07 ENCOUNTER — Telehealth: Payer: Self-pay | Admitting: Pharmacist

## 2023-05-07 NOTE — Telephone Encounter (Signed)
   05/07/2023 Name: [name] MRN: [id number] DOB: 18-Jun-1951  Chief Complaint  Patient presents with   Medication Assistance    Novo Nordisk PAP dose changes   Diabetes: - Currently uncontrolled, but improving - Recommend to :             Continue Ozempic 2mg  weekly--patient denies personal or family history of medullary thyroid carcinoma (MTC) or in patients with Multiple Endocrine Neoplasia syndrome type 2 (MEN 2) Continue Tresiba 50-60 units nightly Continue Novolog 30-32 with meals twice daily (only eats about 2 meals/day)  Patient getting all of the above medications via [employer] Patient Assistance program -Check glucose continuously using Libre 3 CGM -A1c ordered for future appt -per patient report & progress notes, beta blocker dose was decreased due to orthostatic hypotension (following closely with cardiology)   Lab Results  Component Value Date   HGBA1C 7.8 (H) 02/13/2023    Assessment/Plan:   Refills and dose change request forms were prepared and faxed by [facility] clinic PharmD Faxed to Novo Nordisk PAP [phone]  Follow Up Plan: 3 months with PharmD     Kieth Brightly, PharmD, BCACP Clinical Pharmacist, [facility]

## 2023-05-08 ENCOUNTER — Ambulatory Visit (INDEPENDENT_AMBULATORY_CARE_PROVIDER_SITE_OTHER): Payer: PPO | Admitting: Family Medicine

## 2023-05-08 ENCOUNTER — Encounter: Payer: Self-pay | Admitting: Family Medicine

## 2023-05-08 VITALS — BP 137/76 | HR 82 | Temp 98.3°F | Ht 66.0 in | Wt 144.2 lb

## 2023-05-08 DIAGNOSIS — I509 Heart failure, unspecified: Secondary | ICD-10-CM | POA: Diagnosis not present

## 2023-05-08 DIAGNOSIS — Z794 Long term (current) use of insulin: Secondary | ICD-10-CM

## 2023-05-08 DIAGNOSIS — E785 Hyperlipidemia, unspecified: Secondary | ICD-10-CM | POA: Diagnosis not present

## 2023-05-08 DIAGNOSIS — I25110 Atherosclerotic heart disease of native coronary artery with unstable angina pectoris: Secondary | ICD-10-CM | POA: Diagnosis not present

## 2023-05-08 DIAGNOSIS — F411 Generalized anxiety disorder: Secondary | ICD-10-CM

## 2023-05-08 DIAGNOSIS — E1169 Type 2 diabetes mellitus with other specified complication: Secondary | ICD-10-CM

## 2023-05-08 DIAGNOSIS — G4701 Insomnia due to medical condition: Secondary | ICD-10-CM

## 2023-05-08 DIAGNOSIS — F339 Major depressive disorder, recurrent, unspecified: Secondary | ICD-10-CM | POA: Diagnosis not present

## 2023-05-08 DIAGNOSIS — N1831 Chronic kidney disease, stage 3a: Secondary | ICD-10-CM

## 2023-05-08 DIAGNOSIS — E1165 Type 2 diabetes mellitus with hyperglycemia: Secondary | ICD-10-CM | POA: Diagnosis not present

## 2023-05-08 DIAGNOSIS — Z951 Presence of aortocoronary bypass graft: Secondary | ICD-10-CM | POA: Diagnosis not present

## 2023-05-08 DIAGNOSIS — E1159 Type 2 diabetes mellitus with other circulatory complications: Secondary | ICD-10-CM

## 2023-05-08 DIAGNOSIS — R300 Dysuria: Secondary | ICD-10-CM

## 2023-05-08 DIAGNOSIS — I152 Hypertension secondary to endocrine disorders: Secondary | ICD-10-CM

## 2023-05-08 LAB — URINALYSIS, ROUTINE W REFLEX MICROSCOPIC
Bilirubin, UA: NEGATIVE
Ketones, UA: NEGATIVE
Leukocytes,UA: NEGATIVE
Nitrite, UA: NEGATIVE
Specific Gravity, UA: 1.02 (ref 1.005–1.030)
Urobilinogen, Ur: 4 mg/dL — ABNORMAL HIGH (ref 0.2–1.0)
pH, UA: 7 (ref 5.0–7.5)

## 2023-05-08 LAB — MICROSCOPIC EXAMINATION
Renal Epithel, UA: NONE SEEN /hpf
Yeast, UA: NONE SEEN

## 2023-05-08 LAB — BAYER DCA HB A1C WAIVED: HB A1C (BAYER DCA - WAIVED): 7.3 % — ABNORMAL HIGH (ref 4.8–5.6)

## 2023-05-08 MED ORDER — SERTRALINE HCL 50 MG PO TABS
50.0000 mg | ORAL_TABLET | Freq: Every day | ORAL | 3 refills | Status: DC
Start: 2023-05-08 — End: 2024-05-06

## 2023-05-08 MED ORDER — TRAZODONE HCL 50 MG PO TABS
50.0000 mg | ORAL_TABLET | Freq: Every evening | ORAL | 3 refills | Status: DC | PRN
Start: 2023-05-08 — End: 2024-05-06

## 2023-05-08 MED ORDER — CEPHALEXIN 500 MG PO CAPS
500.0000 mg | ORAL_CAPSULE | Freq: Two times a day (BID) | ORAL | 0 refills | Status: DC
Start: 2023-05-08 — End: 2023-06-19

## 2023-05-08 NOTE — Progress Notes (Signed)
Established Patient Office Visit  Subjective:  Patient ID: [name], female    DOB: Nov 23, 1950  Age: 72 y.o. MRN: [id number]  CC:  Chief Complaint  Patient presents with   Medical Management of Chronic Issues   Diabetes   Hyperlipidemia   Hypertension   Dysuria    HPI [name] presents for chronic follow up.   1. DM Patient denies foot ulcerations, paresthesia of the feet, vomiting and weight loss.   Current diabetic medications include tresiba 60 units daily, novolog 36 units with meals (only eats 2 small meals a day), ozempic 2 mg Compliant with meds - yes    Current monitoring regimen: home blood tests - CGM Home blood sugar records: above target 59%, in targer 40%, below 1% Any episodes of hypoglycemia? 2 in last week, previously up to 36 lows in one week   Eye exam current (within one year): yes Weight trend: stable   Is She on ACE inhibitor or angiotensin II receptor blocker?  No Is She on statin? Yes crestor   2. HTN/Cardiac Complaint with meds - Yes Current Medications - lisinopril, metoprolol, lasix Checking BP at home: yes, reports 120s/80s Pertinent ROS:  Fatigue - Yes, has been ongoing since CABG Visual Disturbances - No Chest pain - No Dyspnea - No Palpitations - No LE edema - in ankles. Improving with elevation. Not waking up with swelling  S/P CABG in May. Seeing cardiology [facility]  3. Dysuria Dysuria for last few weeks. This has been unchanged. Also reprots frequency. No urgency, flank pain, hematuria, or fever.   4. Depression, anxiety Has recently family stress.  She is taking trazodone at nighttime which out much relief of sleep. Previously on zoloft, not currently. She isn't sure when this was discontinued.      05/08/2023    8:02 AM 02/04/2023    8:07 AM 01/24/2023    3:00 PM  Depression screen PHQ 2/9  Decreased Interest 0 1 0  Down, Depressed, Hopeless 3 0 0  PHQ - 2 Score 3 1 0  Altered sleeping 3 1   Tired, decreased  energy 3 3   Change in appetite 0 2   Feeling bad or failure about yourself  0 0   Trouble concentrating 0 0   Moving slowly or fidgety/restless 0 0   Suicidal thoughts 0 0   PHQ-9 Score 9 7   Difficult doing work/chores Somewhat difficult Somewhat difficult       05/08/2023    8:02 AM 02/04/2023    8:08 AM 09/20/2022    8:28 AM 06/20/2022    8:17 AM  GAD 7 : Generalized Anxiety Score  Nervous, Anxious, on Edge 3 1 2 2   Control/stop worrying 3 1 1 2   Worry too much - different things 3 1 1 2   Trouble relaxing 3 1 2 2   Restless 3 0 1 2  Easily annoyed or irritable 3 0 2 2  Afraid - awful might happen 0 0 1 2  Total GAD 7 Score 18 4 10 14   Anxiety Difficulty Somewhat difficult Not difficult at all Somewhat difficult Somewhat difficult     Past Medical History:  Diagnosis Date   Anxiety    CAD (coronary artery disease)    DES to circumflex 02/2007, BMS to LAD and PTCA diagonal 03/2007   Carotid artery plaque    Mild   Cataract    Depression    Diverticulitis, colon    Elevated d-dimer 01/08/2014  Essential hypertension, benign    GERD (gastroesophageal reflux disease)    H/O hiatal hernia    HLD (hyperlipidemia)    IDDM (insulin dependent diabetes mellitus)    Migraine    "used to have them really bad; don't have them anymore" (01/07/2014)   MS (multiple sclerosis) (HCC)    Not confirmed   PAT (paroxysmal atrial tachycardia)    Prolapse of uterus    PVD (peripheral vascular disease) (HCC)    TIA (transient ischemic attack) 1980's    Past Surgical History:  Procedure Laterality Date   ABDOMINAL HYSTERECTOMY  1986   ovaries remain - prolaspe uterus    APPENDECTOMY  ~ 1970   BREAST BIOPSY Right 1980's   BREAST LUMPECTOMY Right 1980's   Dr. Luan Moore    CARDIAC CATHETERIZATION  01/07/2014   CHOLECYSTECTOMY  ?1987   COLONOSCOPY  2002   Dr. Anwar--> Severe diverticular changes in the region of the sigmoid and descending colon with scattered diverticular changes  throughout the rest of the colon. No polyps, ulcerations. Despite numerous manipulations, the tip of the scope could not be tipped into the cecal area.   COLONOSCOPY  01/10/2012   Procedure: COLONOSCOPY;  Surgeon: Corbin Ade, MD;  [facility]: [facility];  Service: Endoscopy;  Laterality: N/A;  1:55   CORONARY ANGIOPLASTY WITH STENT PLACEMENT  ~ 1997 X 2   "2 + 1"   CORONARY ARTERY BYPASS GRAFT N/A 02/19/2023   Procedure: OFF PUMP CORONARY ARTERY BYPASS GRAFTING (CABG) X 1;  Surgeon: Corliss Skains, MD;  Location: [facility] OR;  Service: Open Heart Surgery;  Laterality: N/A;  LIMA TO LAD   CORONARY BALLOON ANGIOPLASTY N/A 10/05/2022   Procedure: CORONARY BALLOON ANGIOPLASTY;  Surgeon: Tonny Bollman, MD;  Location: [facility] INVASIVE CV LAB;  Service: Cardiovascular;  Laterality: N/A;   CORONARY PRESSURE/FFR STUDY N/A 03/08/2017   Procedure: Intravascular Pressure Wire/FFR Study;  Surgeon: Yvonne Kendall, MD;  Location: [facility] INVASIVE CV LAB;  Service: Cardiovascular;  Laterality: N/A;   CORONARY STENT INTERVENTION N/A 03/26/2022   Procedure: CORONARY STENT INTERVENTION;  Surgeon: Runell Gess, MD;  Location: [facility] INVASIVE CV LAB;  Service: Cardiovascular;  Laterality: N/A;   EYE SURGERY Bilateral 2014   cataract   LEFT HEART CATH AND CORONARY ANGIOGRAPHY N/A 03/08/2017   Procedure: Left Heart Cath and Coronary Angiography;  Surgeon: Yvonne Kendall, MD;  Location: [facility] INVASIVE CV LAB;  Service: Cardiovascular;  Laterality: N/A;   LEFT HEART CATH AND CORONARY ANGIOGRAPHY N/A 03/26/2022   Procedure: LEFT HEART CATH AND CORONARY ANGIOGRAPHY;  Surgeon: Runell Gess, MD;  Location: [facility] INVASIVE CV LAB;  Service: Cardiovascular;  Laterality: N/A;   LEFT HEART CATH AND CORONARY ANGIOGRAPHY N/A 10/05/2022   Procedure: LEFT HEART CATH AND CORONARY ANGIOGRAPHY;  Surgeon: Tonny Bollman, MD;  Location: [facility] INVASIVE CV LAB;  Service: Cardiovascular;  Laterality: N/A;   LEFT HEART CATH AND CORONARY ANGIOGRAPHY N/A  02/14/2023   Procedure: LEFT HEART CATH AND CORONARY ANGIOGRAPHY;  Surgeon: Swaziland, Peter M, MD;  Location: [facility] INVASIVE CV LAB;  Service: Cardiovascular;  Laterality: N/A;   LEFT HEART CATHETERIZATION WITH CORONARY ANGIOGRAM N/A 01/07/2014   Procedure: LEFT HEART CATHETERIZATION WITH CORONARY ANGIOGRAM;  Surgeon: Laurey Morale, MD;  Location: [facility] CATH LAB;  Service: Cardiovascular;  Laterality: N/A;   TEE WITHOUT CARDIOVERSION N/A 02/19/2023   Procedure: TRANSESOPHAGEAL ECHOCARDIOGRAM;  Surgeon: Corliss Skains, MD;  Location: [facility] OR;  Service: Open Heart Surgery;  Laterality: N/A;  Family History  Problem Relation Age of Onset   Heart attack Mother 100   Diabetes Mother    Hypertension Mother    Heart attack Father 68   Heart attack Brother 32       x 6   Heart disease Brother    Diabetes Brother    Colon cancer Paternal Aunt        53s, died with brain anuerysm   Crohn's disease Cousin        paternal   Diabetes Sister    GER disease Daughter    Cervical cancer Daughter    Diabetes Daughter     Social History   Socioeconomic History   Marital status: Widowed    Spouse name: Not on file   Number of children: 4   Years of education: 54   Highest education level: 11th grade  Occupational History   Occupation: Disability    Employer: DISABLED  Tobacco Use   Smoking status: Never   Smokeless tobacco: Never   Tobacco comments:    spouse, 41 years - husband has quit 01/2011  Vaping Use   Vaping status: Never Used  Substance and Sexual Activity   Alcohol use: No   Drug use: No   Sexual activity: Not Currently  Other Topics Concern   Not on file  Social History Narrative   Lives alone, one level, handicap accessible bathroom, her children all live nearby   Social Determinants of Health   Financial Resource Strain: Low Risk  (01/24/2023)   Overall Financial Resource Strain (CARDIA)    Difficulty of Paying Living Expenses: Not hard at all  Food Insecurity: No Food  Insecurity (02/14/2023)   Hunger Vital Sign    Worried About Running Out of Food in the Last Year: Never true    Ran Out of Food in the Last Year: Never true  Transportation Needs: No Transportation Needs (02/14/2023)   PRAPARE - Administrator, Civil Service (Medical): No    Lack of Transportation (Non-Medical): No  Physical Activity: Insufficiently Active (01/24/2023)   Exercise Vital Sign    Days of Exercise per Week: 3 days    Minutes of Exercise per Session: 30 min  Stress: No Stress Concern Present (01/24/2023)   [employer] of Occupational Health - Occupational Stress Questionnaire    Feeling of Stress : Not at all  Social Connections: Moderately Integrated (01/24/2023)   Social Connection and Isolation Panel [NHANES]    Frequency of Communication with Friends and Family: More than three times a week    Frequency of Social Gatherings with Friends and Family: More than three times a week    Attends Religious Services: More than 4 times per year    Active Member of [employer] or Organizations: No    Attends Banker Meetings: Never    Marital Status: Married  Catering manager Violence: Not At Risk (02/14/2023)   Humiliation, Afraid, Rape, and Kick questionnaire    Fear of Current or Ex-Partner: No    Emotionally Abused: No    Physically Abused: No    Sexually Abused: No    Outpatient Medications Prior to Visit  Medication Sig Dispense Refill   acetaminophen (TYLENOL) 500 MG tablet Take 2 tablets (1,000 mg total) by mouth every 6 (six) hours as needed. 30 tablet 0   alendronate (FOSAMAX) 70 MG tablet Take 1 tablet (70 mg total) by mouth every 7 (seven) days. Take with a full glass of water on an  empty stomach. 12 tablet 3   aspirin 81 MG chewable tablet Chew 1 tablet (81 mg total) by mouth daily. 90 tablet 3   clopidogrel (PLAVIX) 75 MG tablet Take 1 tablet (75 mg total) by mouth daily with breakfast. 90 tablet 3   Continuous Blood Gluc Sensor (FREESTYLE LIBRE  3 SENSOR) MISC Place 1 sensor on the skin every 14 days. Use to check glucose continuously. DX:e11.65 2 each 5   diphenhydrAMINE (BENADRYL) 25 mg capsule Take 25 mg by mouth every 6 (six) hours as needed for allergies.     furosemide (LASIX) 20 MG tablet Take 1 tablet (20 mg total) by mouth daily. 90 tablet 3   insulin aspart (NOVOLOG FLEXPEN) 100 UNIT/ML FlexPen Inject 36 Units into the skin 3 (three) times daily with meals. (Patient taking differently: Inject 25-36 Units into the skin 3 (three) times daily with meals.)     insulin degludec (TRESIBA) 200 UNIT/ML FlexTouch Pen Inject 60 Units into the skin daily.     levocetirizine (XYZAL) 5 MG tablet Take 0.5 tablets (2.5 mg total) by mouth every evening. 90 tablet 3   lisinopril (ZESTRIL) 10 MG tablet Take 1 tablet (10 mg total) by mouth daily. 90 tablet 3   metoprolol tartrate (LOPRESSOR) 25 MG tablet Take 0.5 tablets (12.5 mg total) by mouth 2 (two) times daily. 60 tablet 3   nitroGLYCERIN (NITROSTAT) 0.4 MG SL tablet DISSOLVE 1 TABLET UNDER TONGUE FOR CHESTPAIN.MAY REPEAT EVERY 5 MINUTES FOR 3 DOSES.IF NO RELIEF CALL 911 OR GO TO ER 25 tablet 3   potassium chloride (KLOR-CON M) 10 MEQ tablet Take 1 tablet (10 mEq total) by mouth 2 (two) times daily. 80 tablet 1   rOPINIRole (REQUIP) 0.5 MG tablet Take 2 tablets (1 mg total) by mouth daily as needed (restless leg).     rosuvastatin (CRESTOR) 20 MG tablet TAKE ONE (1) TABLET BY MOUTH EVERY DAY 90 tablet 0   Semaglutide, 1 MG/DOSE, (OZEMPIC, 1 MG/DOSE,) 2 MG/1.5ML SOPN Inject 2 mg into the skin once a week.     traZODone (DESYREL) 100 MG tablet Take 1 tablet (100 mg total) by mouth at bedtime as needed for sleep. 90 tablet 1   No facility-administered medications prior to visit.    Allergies  Allergen Reactions   Iohexol      Desc: pt had syncopal episode with nausea post IV CM late 1990's,  pt has had prednisone prep with heart caths x 2 without problem  kdean 04/16/07, Onset Date: [id number]     Ticlid [Ticlopidine Hcl] Nausea And Vomiting   Jardiance [Empagliflozin] Other (See Comments)    Recurrent UTIs   Metformin And Related Diarrhea   Codeine Nausea And Vomiting and Palpitations    ROS Review of Systems As per HPI.   Objective:    Physical Exam Vitals and nursing note reviewed.  Constitutional:      General: She is not in acute distress.    Appearance: She is not ill-appearing, toxic-appearing or diaphoretic.  HENT:     Head: Normocephalic and atraumatic.  Neck:     Thyroid: No thyroid mass, thyromegaly or thyroid tenderness.  Cardiovascular:     Rate and Rhythm: Normal rate and regular rhythm.     Heart sounds: Normal heart sounds. No murmur heard. Pulmonary:     Effort: Pulmonary effort is normal. No respiratory distress.     Breath sounds: Normal breath sounds.  Chest:     Comments: Surgical scar present Abdominal:  General: Bowel sounds are normal. There is no distension.     Palpations: Abdomen is soft.     Tenderness: There is no abdominal tenderness. There is no right CVA tenderness, left CVA tenderness, guarding or rebound.  Musculoskeletal:     Cervical back: Neck supple. No rigidity.     Right lower leg: No edema.     Left lower leg: No edema.  Skin:    General: Skin is warm and dry.  Neurological:     General: No focal deficit present.     Mental Status: She is alert and oriented to person, place, and time.     Gait: Gait normal.  Psychiatric:        Mood and Affect: Mood normal.        Behavior: Behavior normal.        Thought Content: Thought content normal.        Judgment: Judgment normal.    BP 137/76   Pulse 82   Temp 98.3 F (36.8 C) (Temporal)   Ht 5\' 6"  (1.676 m)   Wt 144 lb 4 oz (65.4 kg)   SpO2 98%   BMI 23.28 kg/m  Wt Readings from Last 3 Encounters:  05/08/23 144 lb 4 oz (65.4 kg)  04/02/23 148 lb (67.1 kg)  03/18/23 147 lb (66.7 kg)   Urine dipstick shows positive for RBC's, positive for protein, and  positive for glucose.  Micro exam: 0-5 WBCs, 0-2 RBCs, few + bacteria.   Health Maintenance Due  Topic Date Due   OPHTHALMOLOGY EXAM  04/25/2023    There are no preventive care reminders to display for this patient.  Assessment & Plan:   Tyhesia was seen today for medical management of chronic issues, diabetes, hyperlipidemia, hypertension and dysuria.  Diagnoses and all orders for this visit:  Type 2 diabetes mellitus with hyperglycemia, with long-term current use of insulin (HCC) A1c 7.3 today, not goal of <7 but improved from previous. Medication changes today: none. Discussed compliance with diet, eating more regular meals so that she take take mealtime insulin appropriately and reduce snacking. Hesitant to increase basal insulin due to hypoglycemia events. She is on an ACE/ARB and statin. Eye exam: reminded to schedule. Foot exam: UTD. Urine micro: UTD. Diet and exercise.  -     Bayer DCA Hb A1c Waived -     VITAMIN D 25 Hydroxy (Vit-D Deficiency, Fractures)  Hyperlipidemia associated with type 2 diabetes mellitus (HCC) On statin. Labs pending.  -     Lipid panel  Hypertension associated with diabetes (HCC) Well controlled on current regimen.  -     CBC with Differential/Platelet  Chronic congestive heart failure, unspecified heart failure type (HCC) S/P CABG x 1 Coronary artery disease involving native coronary artery of native heart with unstable angina pectoris [facility]) Managed by cardiology. Stable symptoms. On metoprolol, aspirin, lisinopril.  Stage 3a chronic kidney disease (HCC) On ACE. Labs pending.  -     CMP14+EGFR  Generalized anxiety disorder Depression, recurrent (HCC) Not well controlled. Denies SI. Restart zoloft.  -     sertraline (ZOLOFT) 50 MG tablet; Take 1 tablet (50 mg total) by mouth daily.  Insomnia due to medical condition Will decrease trazodone since we are restarting zoloft. Will work to improve anxiety which is causing her difficultly sleeping.   -     traZODone (DESYREL) 50 MG tablet; Take 1 tablet (50 mg total) by mouth at bedtime as needed for sleep.  Dysuria Start keflex  pending urine cultures. Hx of recurrent UTIs.  -     Urinalysis, Routine w reflex microscopic -     cephALEXin (KEFLEX) 500 MG capsule; Take 1 capsule (500 mg total) by mouth 2 (two) times daily. -     Urine Culture -     Microscopic Examination   Return in about 6 weeks (around 06/19/2023) for anxiety.   The patient indicates understanding of these issues and agrees with the plan.   Gabriel Earing, FNP

## 2023-05-09 ENCOUNTER — Other Ambulatory Visit: Payer: Self-pay

## 2023-05-09 ENCOUNTER — Encounter (HOSPITAL_COMMUNITY): Payer: Self-pay | Admitting: *Deleted

## 2023-05-09 ENCOUNTER — Emergency Department (HOSPITAL_COMMUNITY)
Admission: EM | Admit: 2023-05-09 | Discharge: 2023-05-09 | Disposition: A | Discharge status: Home / Self Care | Payer: PPO | Attending: Emergency Medicine | Admitting: Emergency Medicine

## 2023-05-09 ENCOUNTER — Ambulatory Visit: Payer: Self-pay

## 2023-05-09 DIAGNOSIS — Z7982 Long term (current) use of aspirin: Secondary | ICD-10-CM | POA: Diagnosis not present

## 2023-05-09 DIAGNOSIS — Z7901 Long term (current) use of anticoagulants: Secondary | ICD-10-CM | POA: Diagnosis not present

## 2023-05-09 DIAGNOSIS — R7989 Other specified abnormal findings of blood chemistry: Secondary | ICD-10-CM | POA: Diagnosis present

## 2023-05-09 DIAGNOSIS — N179 Acute kidney failure, unspecified: Secondary | ICD-10-CM

## 2023-05-09 DIAGNOSIS — E876 Hypokalemia: Secondary | ICD-10-CM | POA: Diagnosis not present

## 2023-05-09 LAB — COMPREHENSIVE METABOLIC PANEL
ALT: 11 U/L (ref 0–44)
AST: 16 U/L (ref 15–41)
Albumin: 3.4 g/dL — ABNORMAL LOW (ref 3.5–5.0)
Alkaline Phosphatase: 57 U/L (ref 38–126)
Anion gap: 13 (ref 5–15)
BUN: 12 mg/dL (ref 8–23)
CO2: 30 mmol/L (ref 22–32)
Calcium: 8.7 mg/dL — ABNORMAL LOW (ref 8.9–10.3)
Chloride: 90 mmol/L — ABNORMAL LOW (ref 98–111)
Creatinine, Ser: 1.62 mg/dL — ABNORMAL HIGH (ref 0.44–1.00)
GFR, Estimated: 33 mL/min — ABNORMAL LOW (ref >60)
Glucose, Bld: 446 mg/dL — ABNORMAL HIGH (ref 70–99)
Potassium: 2.9 mmol/L — ABNORMAL LOW (ref 3.5–5.1)
Sodium: 133 mmol/L — ABNORMAL LOW (ref 135–145)
Total Bilirubin: 0.8 mg/dL (ref 0.3–1.2)
Total Protein: 6.8 g/dL (ref 6.5–8.1)

## 2023-05-09 LAB — CBC WITH DIFFERENTIAL/PLATELET
Abs Immature Granulocytes: 0.01 10*3/uL (ref 0.00–0.07)
Basophils Absolute: 0 10*3/uL (ref 0.0–0.2)
Basophils Absolute: 0 10*3/uL (ref 0.0–0.1)
Basophils Relative: 0 %
Basos: 1 %
EOS (ABSOLUTE): 0.3 10*3/uL (ref 0.0–0.4)
Eos: 4 %
Eosinophils Absolute: 0.1 10*3/uL (ref 0.0–0.5)
Eosinophils Relative: 1 %
HCT: 35 % — ABNORMAL LOW (ref 36.0–46.0)
Hematocrit: 39.1 % (ref 34.0–46.6)
Hemoglobin: 12.5 g/dL (ref 11.1–15.9)
Hemoglobin: 11.7 g/dL — ABNORMAL LOW (ref 12.0–15.0)
Immature Grans (Abs): 0 10*3/uL (ref 0.0–0.1)
Immature Granulocytes: 0 %
Immature Granulocytes: 0 %
Lymphocytes Absolute: 2.9 10*3/uL (ref 0.7–3.1)
Lymphocytes Relative: 23 %
Lymphs Abs: 1.7 10*3/uL (ref 0.7–4.0)
Lymphs: 35 %
MCH: 28.5 pg (ref 26.6–33.0)
MCH: 29.5 pg (ref 26.0–34.0)
MCHC: 32 g/dL (ref 31.5–35.7)
MCHC: 33.4 g/dL (ref 30.0–36.0)
MCV: 89 fL (ref 79–97)
MCV: 88.2 fL (ref 80.0–100.0)
Monocytes Absolute: 0.5 10*3/uL (ref 0.1–0.9)
Monocytes Absolute: 0.4 10*3/uL (ref 0.1–1.0)
Monocytes Relative: 5 %
Monocytes: 7 %
Neutro Abs: 5.1 10*3/uL (ref 1.7–7.7)
Neutrophils Absolute: 4.6 10*3/uL (ref 1.4–7.0)
Neutrophils Relative %: 71 %
Neutrophils: 53 %
Platelets: 198 10*3/uL (ref 150–450)
Platelets: 154 10*3/uL (ref 150–400)
RBC: 4.39 x10E6/uL (ref 3.77–5.28)
RBC: 3.97 MIL/uL (ref 3.87–5.11)
RDW: 13.3 % (ref 11.7–15.4)
RDW: 13.4 % (ref 11.5–15.5)
WBC: 8.3 10*3/uL (ref 3.4–10.8)
WBC: 7.3 10*3/uL (ref 4.0–10.5)
nRBC: 0 % (ref 0.0–0.2)

## 2023-05-09 LAB — CMP14+EGFR
ALT: 7 IU/L (ref 0–32)
AST: 16 IU/L (ref 0–40)
Albumin: 4 g/dL (ref 3.8–4.8)
Alkaline Phosphatase: 74 IU/L (ref 44–121)
BUN/Creatinine Ratio: 6 — ABNORMAL LOW (ref 12–28)
BUN: 10 mg/dL (ref 8–27)
Bilirubin Total: 0.4 mg/dL (ref 0.0–1.2)
CO2: 30 mmol/L — ABNORMAL HIGH (ref 20–29)
Calcium: 9.6 mg/dL (ref 8.7–10.3)
Chloride: 95 mmol/L — ABNORMAL LOW (ref 96–106)
Creatinine, Ser: 1.6 mg/dL — ABNORMAL HIGH (ref 0.57–1.00)
Globulin, Total: 2.8 g/dL (ref 1.5–4.5)
Glucose: 205 mg/dL — ABNORMAL HIGH (ref 70–99)
Potassium: 2.8 mmol/L — ABNORMAL LOW (ref 3.5–5.2)
Sodium: 141 mmol/L (ref 134–144)
Total Protein: 6.8 g/dL (ref 6.0–8.5)
eGFR: 34 mL/min/{1.73_m2} — ABNORMAL LOW (ref >59)

## 2023-05-09 LAB — LIPID PANEL
Chol/HDL Ratio: 2.6 ratio (ref 0.0–4.4)
Cholesterol, Total: 121 mg/dL (ref 100–199)
HDL: 46 mg/dL (ref >39)
LDL Chol Calc (NIH): 56 mg/dL (ref 0–99)
Triglycerides: 100 mg/dL (ref 0–149)
VLDL Cholesterol Cal: 19 mg/dL (ref 5–40)

## 2023-05-09 LAB — VITAMIN D 25 HYDROXY (VIT D DEFICIENCY, FRACTURES): Vit D, 25-Hydroxy: 41.8 ng/mL (ref 30.0–100.0)

## 2023-05-09 LAB — MAGNESIUM: Magnesium: 2 mg/dL (ref 1.7–2.4)

## 2023-05-09 MED ORDER — LACTATED RINGERS IV BOLUS
1000.0000 mL | Freq: Once | INTRAVENOUS | Status: AC
  Administered 2023-05-09: 1000 mL via INTRAVENOUS

## 2023-05-09 MED ORDER — POTASSIUM CHLORIDE 10 MEQ/100ML IV SOLN
10.0000 meq | Freq: Once | INTRAVENOUS | Status: AC
  Administered 2023-05-09: 10 meq via INTRAVENOUS
  Filled 2023-05-09: qty 100

## 2023-05-09 MED ORDER — POTASSIUM CHLORIDE CRYS ER 20 MEQ PO TBCR
40.0000 meq | EXTENDED_RELEASE_TABLET | Freq: Once | ORAL | Status: AC
  Administered 2023-05-09: 40 meq via ORAL
  Filled 2023-05-09: qty 2

## 2023-05-09 NOTE — ED Notes (Signed)
Pt states, "Dr told me to come to the hospital potassium is low. My potassium normally runs low. I have been dizzy the last two days. I'm supposed to take my potassium pills every morning but I don't; I try to make my medicine last longer. "  Pt denies blurred vision, headaches, and no cramping Pt denies N/V

## 2023-05-09 NOTE — ED Triage Notes (Signed)
Pt reports she was at her PCP yesterday to get her HgA1C checked and they called her back this morning telling her to come to the hospital because her potassium level was 2.2. Pt reports she is supposed to be taking potassium twice daily, but only takes it once a day about 3 times per week due to financial reasons.

## 2023-05-09 NOTE — ED Provider Notes (Signed)
[place] EMERGENCY DEPARTMENT AT [facility] Provider Note   CSN: [id number] Arrival date & time: 05/09/23  8295     History  Chief Complaint  Patient presents with   Abnormal Labs    [name] is a 72 y.o. female.  HPI 72 year old presents after being called by her doctor due to a low potassium.  She is supposed to be on twice daily potassium but did not realize that until recently.  Sometimes she stretches out her medications due to cost but inadvertently has been taking 1 potassium a day instead of the 2 that she was prescribed a while ago.  She feels fatigued but that is been going on since her cardiac surgery in May.  She did feel like she had a couple hours of palpitations last night.  Did not feel like her heart rate was high but rather just skipping some beats.  However she denies any recurrent chest pain or chest pressure.  No shortness of breath.  She took a dose of potassium this morning after being called by her doctor but she is not sure how much it was.  She chronically feels like she has orthostatic lightheadedness, not worse than baseline.  No UTI symptoms currently.  Home Medications Prior to Admission medications   Medication Sig Start Date End Date Taking? Authorizing Provider  alendronate (FOSAMAX) 70 MG tablet Take 1 tablet (70 mg total) by mouth every 7 (seven) days. Take with a full glass of water on an empty stomach. 02/06/23  Yes Gabriel Earing, FNP  aspirin 81 MG chewable tablet Chew 1 tablet (81 mg total) by mouth daily. 03/28/22  Yes Rai, Ripudeep K, MD  cephALEXin (KEFLEX) 500 MG capsule Take 1 capsule (500 mg total) by mouth 2 (two) times daily. 05/08/23  Yes Gabriel Earing, FNP  clopidogrel (PLAVIX) 75 MG tablet Take 1 tablet (75 mg total) by mouth daily with breakfast. 03/28/22  Yes Rai, Ripudeep K, MD  diphenhydrAMINE (BENADRYL) 25 mg capsule Take 25 mg by mouth every 6 (six) hours as needed for allergies.   Yes [provider]   furosemide (LASIX) 20 MG tablet Take 1 tablet (20 mg total) by mouth daily. 04/04/23  Yes Gabriel Earing, FNP  insulin aspart (NOVOLOG FLEXPEN) 100 UNIT/ML FlexPen Inject 36 Units into the skin 3 (three) times daily with meals. Patient taking differently: Inject 25-36 Units into the skin 3 (three) times daily with meals. 10/06/22  Yes Vann, Jessica U, DO  insulin degludec (TRESIBA) 200 UNIT/ML FlexTouch Pen Inject 30-60 Units into the skin daily. Pt states she takes 60 units if she eats and 30 units if she does not eat   Yes [provider]  levocetirizine (XYZAL) 5 MG tablet Take 0.5 tablets (2.5 mg total) by mouth every evening. 03/06/23  Yes Gabriel Earing, FNP  lisinopril (ZESTRIL) 10 MG tablet Take 1 tablet (10 mg total) by mouth daily. 11/21/22  Yes Rollene Rotunda, MD  metoprolol tartrate (LOPRESSOR) 25 MG tablet Take 0.5 tablets (12.5 mg total) by mouth 2 (two) times daily. 04/02/23  Yes Roddenberry, Myron G, PA-C  nitroGLYCERIN (NITROSTAT) 0.4 MG SL tablet DISSOLVE 1 TABLET UNDER TONGUE FOR CHESTPAIN.MAY REPEAT EVERY 5 MINUTES FOR 3 DOSES.IF NO RELIEF CALL 911 OR GO TO ER 03/27/22  Yes Rai, Ripudeep K, MD  potassium chloride (KLOR-CON M) 10 MEQ tablet Take 1 tablet (10 mEq total) by mouth 2 (two) times daily. Patient taking differently: Take 10 mEq by  mouth daily. 06/21/22  Yes Gabriel Earing, FNP  rOPINIRole (REQUIP) 0.5 MG tablet Take 2 tablets (1 mg total) by mouth daily as needed (restless leg). 10/06/22  Yes Vann, Jessica U, DO  rosuvastatin (CRESTOR) 20 MG tablet TAKE ONE (1) TABLET BY MOUTH EVERY DAY 03/13/23  Yes Gabriel Earing, FNP  Semaglutide, 1 MG/DOSE, (OZEMPIC, 1 MG/DOSE,) 2 MG/1.5ML SOPN Inject 2 mg into the skin once a week.   Yes [provider]  sertraline (ZOLOFT) 50 MG tablet Take 1 tablet (50 mg total) by mouth daily. 05/08/23  Yes Gabriel Earing, FNP  traZODone (DESYREL) 50 MG tablet Take 1 tablet (50 mg total) by mouth at bedtime as needed for  sleep. 05/08/23  Yes Gabriel Earing, FNP  Continuous Blood Gluc Sensor (FREESTYLE LIBRE 3 SENSOR) MISC Place 1 sensor on the skin every 14 days. Use to check glucose continuously. DX:e11.65 11/30/22   Junie Spencer, FNP      Allergies    Iohexol, Ticlid [ticlopidine hcl], Jardiance [empagliflozin], Metformin and related, and Codeine    Review of Systems   Review of Systems  Constitutional:  Positive for fatigue.  Respiratory:  Negative for shortness of breath.   Cardiovascular:  Positive for palpitations. Negative for chest pain.    Physical Exam Updated Vital Signs BP (!) 159/61   Pulse 81   Temp 98 F (36.7 C) (Oral)   Resp 18   Ht 5\' 6"  (1.676 m)   Wt 65.4 kg   SpO2 97%   BMI 23.28 kg/m  Physical Exam Vitals and nursing note reviewed.  Constitutional:      General: She is not in acute distress.    Appearance: She is well-developed. She is not ill-appearing or diaphoretic.  HENT:     Head: Normocephalic and atraumatic.  Cardiovascular:     Rate and Rhythm: Normal rate and regular rhythm.     Heart sounds: Normal heart sounds.  Pulmonary:     Effort: Pulmonary effort is normal.     Breath sounds: Normal breath sounds.  Abdominal:     General: There is no distension.  Skin:    General: Skin is warm and dry.  Neurological:     Mental Status: She is alert.     ED Results / Procedures / Treatments   Labs (all labs ordered are listed, but only abnormal results are displayed) Labs Reviewed  COMPREHENSIVE METABOLIC PANEL - Abnormal; Notable for the following components:      Result Value   Sodium 133 (*)    Potassium 2.9 (*)    Chloride 90 (*)    Glucose, Bld 446 (*)    Creatinine, Ser 1.62 (*)    Calcium 8.7 (*)    Albumin 3.4 (*)    GFR, Estimated 33 (*)    All other components within normal limits  CBC WITH DIFFERENTIAL/PLATELET - Abnormal; Notable for the following components:   Hemoglobin 11.7 (*)    HCT 35.0 (*)    All other components within  normal limits  MAGNESIUM    EKG EKG Interpretation Date/Time:  Thursday May 09 2023 10:35:07 EDT Ventricular Rate:  76 PR Interval:  223 QRS Duration:  107 QT Interval:  442 QTC Calculation: 497 R Axis:   -1  Text Interpretation: Sinus rhythm Low voltage, extremity leads Borderline prolonged QT interval Confirmed by Pricilla Loveless [phone]) on 05/09/2023 11:03:21 AM  Radiology No results found.  Procedures Procedures    Medications Ordered in ED  Medications  lactated ringers bolus 1,000 mL (0 mLs Intravenous Stopped 05/09/23 1148)  potassium chloride 10 mEq in 100 mL IVPB (0 mEq Intravenous Stopped 05/09/23 1149)  potassium chloride SA (KLOR-CON M) CR tablet 40 mEq (40 mEq Oral Given 05/09/23 1038)    ED Course/ Medical Decision Making/ A&P                                 Medical Decision Making Amount and/or Complexity of Data Reviewed External Data Reviewed: labs and notes.    Details: Cr up to 1.6, K 2.8 from yesterday's labs. Labs: ordered.    Details: Creatinine stable at 1.6, potassium 2.9.  Normal magnesium. ECG/medicine tests: ordered and independent interpretation performed.    Details: No acute ischemia  Risk Prescription drug management.   Patient was given oral and IV potassium.  She otherwise feels well.  She describes some on and off palpitations for an hour or so last night but here on cardiac monitoring she has only had a couple PVCs.  Initial QTc was borderline around 500 but the repeat is under 500.  I have low concern that she is at risk for torsades at this point.  She is tolerating the potassium replacement.  Her kidney functions a little worse than it was back in May.  Unclear how acute this is.  She describes chronic lightheadedness since May.  Will have her stop her Lasix for couple days as this could be contributing to both the potassium and the creatinine and have her follow-up closely with her PCP.  She prefers to go home rather than be observed  in the hospital overnight.  Will discharge home with return precautions.        Final Clinical Impression(s) / ED Diagnoses Final diagnoses:  Hypokalemia  Acute kidney injury [facility])    Rx / DC Orders ED Discharge Orders     None         Pricilla Loveless, MD 05/09/23 1237

## 2023-05-09 NOTE — Chronic Care Management (AMB) (Signed)
   05/09/2023  [name] [birth year] [id number]   Reason for Encounter: Patient is not currently enrolled in the CCM program. CCM status changed to Previously enrolled.   [facility]/Care Management [phone]

## 2023-05-09 NOTE — Discharge Instructions (Addendum)
For now, stop the FUROSEMIDE/LASIX, as this can cause both dehydration and low potassium levels.  Do not take it for the next 3 days.  Otherwise, call your doctor for close outpatient follow-up and recheck of your kidney function and your potassium early next week.  Your blood sugar was pretty high as well (400) and so make sure you take your insulin when you get home.  If you feel chest pain, palpitations, trouble breathing, weakness, dizziness, or any other new/concerning symptoms then return to the ER or call 911.

## 2023-05-10 ENCOUNTER — Other Ambulatory Visit: Payer: PPO

## 2023-05-10 DIAGNOSIS — E876 Hypokalemia: Secondary | ICD-10-CM | POA: Diagnosis not present

## 2023-05-10 LAB — BMP8+EGFR
BUN/Creatinine Ratio: 6 — ABNORMAL LOW (ref 12–28)
BUN: 9 mg/dL (ref 8–27)
CO2: 25 mmol/L (ref 20–29)
Calcium: 9.1 mg/dL (ref 8.7–10.3)
Chloride: 100 mmol/L (ref 96–106)
Creatinine, Ser: 1.46 mg/dL — ABNORMAL HIGH (ref 0.57–1.00)
Glucose: 231 mg/dL — ABNORMAL HIGH (ref 70–99)
Potassium: 3.3 mmol/L — ABNORMAL LOW (ref 3.5–5.2)
Sodium: 141 mmol/L (ref 134–144)
eGFR: 38 mL/min/{1.73_m2} — ABNORMAL LOW (ref >59)

## 2023-05-10 LAB — URINE CULTURE: Organism ID, Bacteria: NO GROWTH

## 2023-05-13 ENCOUNTER — Encounter: Payer: Self-pay | Admitting: Family Medicine

## 2023-05-13 ENCOUNTER — Ambulatory Visit (INDEPENDENT_AMBULATORY_CARE_PROVIDER_SITE_OTHER): Payer: PPO | Admitting: Family Medicine

## 2023-05-13 VITALS — BP 147/69 | HR 88 | Temp 98.5°F | Ht 66.0 in | Wt 146.5 lb

## 2023-05-13 DIAGNOSIS — N1831 Chronic kidney disease, stage 3a: Secondary | ICD-10-CM | POA: Diagnosis not present

## 2023-05-13 DIAGNOSIS — R42 Dizziness and giddiness: Secondary | ICD-10-CM | POA: Insufficient documentation

## 2023-05-13 DIAGNOSIS — S0990XA Unspecified injury of head, initial encounter: Secondary | ICD-10-CM

## 2023-05-13 DIAGNOSIS — I509 Heart failure, unspecified: Secondary | ICD-10-CM | POA: Diagnosis not present

## 2023-05-13 DIAGNOSIS — E876 Hypokalemia: Secondary | ICD-10-CM

## 2023-05-13 NOTE — Telephone Encounter (Signed)
Novo nordisk dose change request rec'd and approved 05/09/23. Medications should ship to office in 10-14 business days.

## 2023-05-13 NOTE — Progress Notes (Signed)
Established Patient Office Visit  Subjective   Patient ID: [name], female    DOB: [birth year]  Age: 72 y.o. MRN: [id number]  Chief Complaint  Patient presents with   hypokalemia   Dizziness    HPI [name] was seen in the ER at [facility] on 05/09/23 for hypokalemia. She was given IV and oral potassium. She was placed on a cardiac monitor that was normal outside of a few PVs. QTc was around 500 initially but <500 on repeat. Lasix was held to help with hypokalemia and kidney function as well as chronic lightheadedness. On repeat labs on 05/10/23, potassium has improved, but remained low at 3.3. She was instructed to continue potassium 20 mg BID and continue to hold lasix. She has been drinking about 32 ounces of water a day. Denies chest pain, palpitations.   On 05/11/23 she fell. She had walked to her door to open it for a package. When she turned to shut the door. When she turned, the felt the room spin. The fell. She hit her head but isn't sure on what. The delivery man heard her fall and opened to door to help her up. She reports mild dizziness since this with lightheadedness with standing. Hx of vertigo and orthostatic hypotension. She denies LOC syncope, confusion, focal weakness, changes in gait, visual disturbances, nausea, vomiting.   She hasn't been taking lasix but is starting to having some mild swelling in her feet. She was taking 10 mg of lasix daily.      ROS As per HPI.    Objective:     BP (!) 147/69   Pulse 88   Temp 98.5 F (36.9 C) (Temporal)   Ht 5\' 6"  (1.676 m)   Wt 146 lb 8 oz (66.5 kg)   SpO2 96%   BMI 23.65 kg/m  Wt Readings from Last 3 Encounters:  05/13/23 146 lb 8 oz (66.5 kg)  05/09/23 144 lb 4 oz (65.4 kg)  05/08/23 144 lb 4 oz (65.4 kg)      Physical Exam Vitals and nursing note reviewed.  Constitutional:      General: She is not in acute distress.    Appearance: She is not ill-appearing, toxic-appearing or diaphoretic.  HENT:     Head:  Normocephalic and atraumatic.     Nose: Nose normal.     Mouth/Throat:     Mouth: Mucous membranes are moist.     Pharynx: Oropharynx is clear.  Eyes:     Extraocular Movements: Extraocular movements intact.     Pupils: Pupils are equal, round, and reactive to light.  Cardiovascular:     Rate and Rhythm: Normal rate and regular rhythm.     Heart sounds: Normal heart sounds. No murmur heard. Pulmonary:     Effort: Pulmonary effort is normal. No respiratory distress.     Breath sounds: Normal breath sounds. No wheezing.  Abdominal:     General: Bowel sounds are normal. There is no distension.     Palpations: Abdomen is soft.     Tenderness: There is no abdominal tenderness. There is no guarding or rebound.  Musculoskeletal:     Cervical back: Neck supple. No rigidity.     Right lower leg: No edema.     Left lower leg: No edema.  Skin:    General: Skin is warm and dry.  Neurological:     General: No focal deficit present.     Mental Status: She is alert and oriented to person,  place, and time.     Cranial Nerves: No cranial nerve deficit.     Sensory: No sensory deficit.     Motor: No weakness.     Coordination: Coordination normal.     Gait: Gait normal.  Psychiatric:        Mood and Affect: Mood normal.        Behavior: Behavior normal.      No results found for any visits on 05/13/23.    The ASCVD Risk score (Arnett DK, et al., 2019) failed to calculate for the following reasons:   The patient has a prior MI or stroke diagnosis    Assessment & Plan:   [name] was seen today for hypokalemia and dizziness.  Diagnoses and all orders for this visit:  Hypokalemia Currently on potassium 20 mg BID. Repeat pending. Will continue to hold lasix for now. Discussed to restart at 10 mg for edema or weight gain.  -     CMP14+EGFR  Stage 3a chronic kidney disease (HCC) Recheck today. Discussed nephrology referral to remains declined from her baseline.  -      CMP14+EGFR  Dizziness Orhtostatic hypotension. See above regarding lasix. Discussed hydration. Will check anemia panel as recent hemoglobin was a little low.  -     CMP14+EGFR -     Anemia Profile B  Chronic congestive heart failure, unspecified heart failure type (HCC) Euvolemic today.   Injury of head, initial encounter Discussed need for CT of head to assess for stroke/ICH. Discussed risk of untreated ICH including death. She declines despite risks. Neuro exam at baseline today. Strict return precautions given and discussed when to seek emergency care.    Will determine follow up pending labs.   The patient indicates understanding of these issues and agrees with the plan.   Gabriel Earing, FNP

## 2023-05-14 LAB — ANEMIA PROFILE B
Basophils Absolute: 0 10*3/uL (ref 0.0–0.2)
Basos: 0 %
EOS (ABSOLUTE): 0.2 10*3/uL (ref 0.0–0.4)
Eos: 2 %
Ferritin: 134 ng/mL (ref 15–150)
Folate: 7.8 ng/mL (ref >3.0)
Hematocrit: 35.8 % (ref 34.0–46.6)
Hemoglobin: 11.9 g/dL (ref 11.1–15.9)
Immature Grans (Abs): 0 10*3/uL (ref 0.0–0.1)
Immature Granulocytes: 0 %
Iron Saturation: 23 % (ref 15–55)
Iron: 52 ug/dL (ref 27–139)
Lymphocytes Absolute: 2.3 10*3/uL (ref 0.7–3.1)
Lymphs: 34 %
MCH: 29.6 pg (ref 26.6–33.0)
MCHC: 33.2 g/dL (ref 31.5–35.7)
MCV: 89 fL (ref 79–97)
Monocytes Absolute: 0.3 10*3/uL (ref 0.1–0.9)
Monocytes: 4 %
Neutrophils Absolute: 4 10*3/uL (ref 1.4–7.0)
Neutrophils: 60 %
Platelets: 174 10*3/uL (ref 150–450)
RBC: 4.02 x10E6/uL (ref 3.77–5.28)
RDW: 13.2 % (ref 11.7–15.4)
Retic Ct Pct: 1.3 % (ref 0.6–2.6)
Total Iron Binding Capacity: 226 ug/dL — ABNORMAL LOW (ref 250–450)
UIBC: 174 ug/dL (ref 118–369)
Vitamin B-12: 239 pg/mL (ref 232–1245)
WBC: 6.8 10*3/uL (ref 3.4–10.8)

## 2023-05-14 LAB — CMP14+EGFR
ALT: 11 IU/L (ref 0–32)
AST: 19 IU/L (ref 0–40)
Albumin: 3.8 g/dL (ref 3.8–4.8)
Alkaline Phosphatase: 66 IU/L (ref 44–121)
BUN/Creatinine Ratio: 4 — ABNORMAL LOW (ref 12–28)
BUN: 6 mg/dL — ABNORMAL LOW (ref 8–27)
Bilirubin Total: 0.3 mg/dL (ref 0.0–1.2)
CO2: 24 mmol/L (ref 20–29)
Calcium: 9.2 mg/dL (ref 8.7–10.3)
Chloride: 100 mmol/L (ref 96–106)
Creatinine, Ser: 1.51 mg/dL — ABNORMAL HIGH (ref 0.57–1.00)
Globulin, Total: 2.6 g/dL (ref 1.5–4.5)
Glucose: 304 mg/dL — ABNORMAL HIGH (ref 70–99)
Potassium: 4 mmol/L (ref 3.5–5.2)
Sodium: 138 mmol/L (ref 134–144)
Total Protein: 6.4 g/dL (ref 6.0–8.5)
eGFR: 36 mL/min/{1.73_m2} — ABNORMAL LOW (ref >59)

## 2023-05-15 ENCOUNTER — Other Ambulatory Visit: Payer: Self-pay | Admitting: *Deleted

## 2023-05-15 ENCOUNTER — Telehealth: Payer: Self-pay | Admitting: Pharmacist

## 2023-05-15 DIAGNOSIS — N1831 Chronic kidney disease, stage 3a: Secondary | ICD-10-CM

## 2023-05-15 NOTE — Telephone Encounter (Signed)
    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)    Latest Ref Rng & Units 05/13/2023    9:48 AM 05/10/2023    8:24 AM 05/09/2023    9:30 AM  BMP  Glucose 70 - 99 mg/dL 132  440  102   BUN 8 - 27 mg/dL 6  9  12    Creatinine 0.57 - 1.00 mg/dL 7.25  3.66  4.40   BUN/Creat Ratio 12 - 28 4  6     Sodium 134 - 144 mmol/L 138  141  133   Potassium 3.5 - 5.2 mmol/L 4.0  3.3  2.9   Chloride 96 - 106 mmol/L 100  100  90   CO2 20 - 29 mmol/L 24  25  30    Calcium 8.7 - 10.3 mg/dL 9.2  9.1  8.7     Medication recommendations for CKD3b: -Consider SGLT2s following nephrology consult We have tried SGLT2s in the past, but patient can't tolerate due to recurrent UTI/yeast (would have her see nephro, can re-challenge; great candidate given T2DM, cardiac & CKD) -Rosuvastatin does not need to be adjusted just yet (if <30--we could just switch to atorvastatin) -alendronate (Fosamax) to be discontinued if crcl<35 --> I would go ahead and hold it now -Nephro can determine if they want to start kerendia (would wait) -all other medications appropriate given chronic conditions -message routed back to PCP   Kieth Brightly, PharmD, BCACP Clinical Pharmacist, [facility]

## 2023-05-16 ENCOUNTER — Other Ambulatory Visit: Payer: Self-pay | Admitting: Family

## 2023-05-16 ENCOUNTER — Telehealth: Payer: Self-pay

## 2023-05-16 DIAGNOSIS — E1165 Type 2 diabetes mellitus with hyperglycemia: Secondary | ICD-10-CM

## 2023-05-16 NOTE — Telephone Encounter (Signed)
Transition Care Management Follow-up Telephone Call Date of discharge and from where: 05/09/2023 [facility] How have you been since you were released from the hospital? Patient stated she is still feeling very tired and has no energy. Any questions or concerns? No  Items Reviewed: Did the pt receive and understand the discharge instructions provided? Yes  Medications obtained and verified? Yes  Other? No  Any new allergies since your discharge? No  Dietary orders reviewed? Yes Do you have support at home? Yes   Follow up appointments reviewed:  PCP Hospital f/u appt confirmed? Yes  Scheduled to see Gabriel Earing, FNP on 05/13/2023 @ [facility]. Specialist Hospital f/u appt confirmed? No  Scheduled to see  on  @ . Are transportation arrangements needed? No  If their condition worsens, is the pt aware to call PCP or go to the Emergency Dept.? Yes Was the patient provided with contact information for the PCP's office or ED? Yes Was to pt encouraged to call back with questions or concerns? Yes  Tunya Held [facility]  [facility] Population Health Community Resource Care Guide   [email or web address]  ?? [phone]   Website: [email or web address]  [name]

## 2023-05-21 DIAGNOSIS — I2510 Atherosclerotic heart disease of native coronary artery without angina pectoris: Secondary | ICD-10-CM | POA: Diagnosis not present

## 2023-05-21 DIAGNOSIS — Z515 Encounter for palliative care: Secondary | ICD-10-CM | POA: Diagnosis not present

## 2023-05-21 DIAGNOSIS — Z6822 Body mass index (BMI) 22.0-22.9, adult: Secondary | ICD-10-CM | POA: Diagnosis not present

## 2023-05-21 DIAGNOSIS — Z7982 Long term (current) use of aspirin: Secondary | ICD-10-CM | POA: Diagnosis not present

## 2023-05-21 DIAGNOSIS — D6869 Other thrombophilia: Secondary | ICD-10-CM | POA: Diagnosis not present

## 2023-05-21 DIAGNOSIS — I509 Heart failure, unspecified: Secondary | ICD-10-CM | POA: Diagnosis not present

## 2023-05-21 DIAGNOSIS — Z7902 Long term (current) use of antithrombotics/antiplatelets: Secondary | ICD-10-CM | POA: Diagnosis not present

## 2023-05-21 DIAGNOSIS — I480 Paroxysmal atrial fibrillation: Secondary | ICD-10-CM | POA: Diagnosis not present

## 2023-05-21 DIAGNOSIS — F331 Major depressive disorder, recurrent, moderate: Secondary | ICD-10-CM | POA: Diagnosis not present

## 2023-05-21 DIAGNOSIS — I4719 Other supraventricular tachycardia: Secondary | ICD-10-CM | POA: Diagnosis not present

## 2023-05-21 DIAGNOSIS — I110 Hypertensive heart disease with heart failure: Secondary | ICD-10-CM | POA: Diagnosis not present

## 2023-05-23 ENCOUNTER — Telehealth: Payer: Self-pay | Admitting: Pharmacist

## 2023-05-23 NOTE — Telephone Encounter (Signed)
   Patient enrolled in the Novo nordisk patient assistance program for [place], [place], [place].  Patient reports blood sugars are controlled and have never been better. Her Evaristo Bury, Ozempic and Novolog have arrived and she will come in today to pick up. Will f/u in 2 months with PHarmD.    Kieth Brightly, PharmD, BCACP Clinical Pharmacist, [facility]

## 2023-05-27 ENCOUNTER — Other Ambulatory Visit: Payer: Self-pay | Admitting: Family Medicine

## 2023-05-27 DIAGNOSIS — E876 Hypokalemia: Secondary | ICD-10-CM

## 2023-05-28 ENCOUNTER — Other Ambulatory Visit: Payer: PPO

## 2023-05-28 DIAGNOSIS — N1831 Chronic kidney disease, stage 3a: Secondary | ICD-10-CM | POA: Diagnosis not present

## 2023-05-29 ENCOUNTER — Other Ambulatory Visit: Payer: PPO

## 2023-05-29 LAB — BMP8+EGFR
BUN/Creatinine Ratio: 8 — ABNORMAL LOW (ref 12–28)
BUN: 10 mg/dL (ref 8–27)
CO2: 23 mmol/L (ref 20–29)
Calcium: 9 mg/dL (ref 8.7–10.3)
Chloride: 102 mmol/L (ref 96–106)
Creatinine, Ser: 1.28 mg/dL — ABNORMAL HIGH (ref 0.57–1.00)
Glucose: 316 mg/dL — ABNORMAL HIGH (ref 70–99)
Potassium: 4.5 mmol/L (ref 3.5–5.2)
Sodium: 139 mmol/L (ref 134–144)
eGFR: 44 mL/min/{1.73_m2} — ABNORMAL LOW (ref >59)

## 2023-05-31 ENCOUNTER — Encounter: Payer: Self-pay | Admitting: Family Medicine

## 2023-05-31 NOTE — Progress Notes (Signed)
Patient was due in June for 6 month imaging follow up for a right breast mass at [facility]. Patient declined appointment at the time as she had just went through open heart surgery. Appointment still has not been made sent a reminder letter through certified mail

## 2023-06-16 ENCOUNTER — Other Ambulatory Visit: Payer: Self-pay | Admitting: Physician Assistant

## 2023-06-16 ENCOUNTER — Other Ambulatory Visit: Payer: Self-pay | Admitting: Family Medicine

## 2023-06-16 DIAGNOSIS — E876 Hypokalemia: Secondary | ICD-10-CM

## 2023-06-16 DIAGNOSIS — E1169 Type 2 diabetes mellitus with other specified complication: Secondary | ICD-10-CM

## 2023-06-17 NOTE — Progress Notes (Unsigned)
Virtual Visit via Video Note   Because of [name]'s co-morbid illnesses, she is at least at moderate risk for complications without adequate follow up.  This format is felt to be most appropriate for this patient at this time.  All issues noted in this document were discussed and addressed.  A limited physical exam was performed with this format.  Please refer to the patient's chart for her consent to telehealth for [facility].  {Did you have to convert this VIDEO visit to AUDIO only?                     :[id number]}   Date:  06/17/2023   ID:  [name], DOB May 07, 1951, MRN [id number]  {Patient [phone]::"Home"} {Provider [phone]::"Home Office"}  PCP:  Gabriel Earing, FNP  Cardiologist:  Rollene Rotunda, MD *** Electrophysiologist:  None   Evaluation Performed:  {Choose Visit Type:[phone]::"Follow-Up Visit"}  Chief Complaint:    History of Present Illness:    [name] is a 72 y.o. female with history of CAD, diabetes, and hyperlipidemia, with multiple interventions to her LAD and diagonal vessel as far back as 2008. Most recent intervention July 2023 where she had DES placed to her circumflex. Repeat cardiac cath January 2024 revealing severe in-stent restenosis treated with angioplasty and medically managed. Readmitted to the hospital on 02/12/2023 due to recurrent unstable angina, with new anterior infarct. Repeat cardiac catheterization revealed significant LAD disease and she was planned for CABG. Patient had CABG x 1 utilizing LIMA to LAD on 02/19/2023.  Seen in the emergency room on 05/09/2019 for with hypokalemia, potassium of 3.3, fatigue, and palpitations.  She was given potassium replacement.  Kidney function had deteriorated and her Lasix was stopped for 2 days and she was to follow-up with PCP.  On follow-up with PCP on 05/13/2023 they did not restart her Lasix.   The patient {does/does not:200015} have symptoms concerning for  COVID-19 infection (fever, chills, cough, or new shortness of breath).    Past Medical History:  Diagnosis Date   Anxiety    CAD (coronary artery disease)    DES to circumflex 02/2007, BMS to LAD and PTCA diagonal 03/2007   Carotid artery plaque    Mild   Cataract    Depression    Diverticulitis, colon    Elevated d-dimer 01/08/2014   Essential hypertension, benign    GERD (gastroesophageal reflux disease)    H/O hiatal hernia    HLD (hyperlipidemia)    IDDM (insulin dependent diabetes mellitus)    Migraine    "used to have them really bad; don't have them anymore" (01/07/2014)   MS (multiple sclerosis) (HCC)    Not confirmed   PAT (paroxysmal atrial tachycardia)    Prolapse of uterus    PVD (peripheral vascular disease) (HCC)    TIA (transient ischemic attack) 1980's   Past Surgical History:  Procedure Laterality Date   ABDOMINAL HYSTERECTOMY  1986   ovaries remain - prolaspe uterus    APPENDECTOMY  ~ 1970   BREAST BIOPSY Right 1980's   BREAST LUMPECTOMY Right 1980's   Dr. Luan Moore    CARDIAC CATHETERIZATION  01/07/2014   CHOLECYSTECTOMY  ?1987   COLONOSCOPY  2002   Dr. Anwar--> Severe diverticular changes in the region of the sigmoid and descending colon with scattered diverticular changes throughout the rest of the colon. No polyps, ulcerations. Despite numerous manipulations, the tip of the scope could not be tipped into  the cecal area.   COLONOSCOPY  01/10/2012   Procedure: COLONOSCOPY;  Surgeon: Corbin Ade, MD;  [facility];  Service: Endoscopy;  Laterality: N/A;  1:55   CORONARY ANGIOPLASTY WITH STENT PLACEMENT  ~ 1997 X 2   "2 + 1"   CORONARY ARTERY BYPASS GRAFT N/A 02/19/2023   Procedure: OFF PUMP CORONARY ARTERY BYPASS GRAFTING (CABG) X 1;  Surgeon: Corliss Skains, MD;  Location: [facility] OR;  Service: Open Heart Surgery;  Laterality: N/A;  LIMA TO LAD   CORONARY BALLOON ANGIOPLASTY N/A 10/05/2022   Procedure: CORONARY BALLOON ANGIOPLASTY;  Surgeon:  Tonny Bollman, MD;  Location: [facility] INVASIVE CV LAB;  Service: Cardiovascular;  Laterality: N/A;   CORONARY PRESSURE/FFR STUDY N/A 03/08/2017   Procedure: Intravascular Pressure Wire/FFR Study;  Surgeon: Yvonne Kendall, MD;  Location: [facility] INVASIVE CV LAB;  Service: Cardiovascular;  Laterality: N/A;   CORONARY STENT INTERVENTION N/A 03/26/2022   Procedure: CORONARY STENT INTERVENTION;  Surgeon: Runell Gess, MD;  Location: [facility] INVASIVE CV LAB;  Service: Cardiovascular;  Laterality: N/A;   EYE SURGERY Bilateral 2014   cataract   LEFT HEART CATH AND CORONARY ANGIOGRAPHY N/A 03/08/2017   Procedure: Left Heart Cath and Coronary Angiography;  Surgeon: Yvonne Kendall, MD;  Location: [facility] INVASIVE CV LAB;  Service: Cardiovascular;  Laterality: N/A;   LEFT HEART CATH AND CORONARY ANGIOGRAPHY N/A 03/26/2022   Procedure: LEFT HEART CATH AND CORONARY ANGIOGRAPHY;  Surgeon: Runell Gess, MD;  Location: [facility] INVASIVE CV LAB;  Service: Cardiovascular;  Laterality: N/A;   LEFT HEART CATH AND CORONARY ANGIOGRAPHY N/A 10/05/2022   Procedure: LEFT HEART CATH AND CORONARY ANGIOGRAPHY;  Surgeon: Tonny Bollman, MD;  Location: [facility] INVASIVE CV LAB;  Service: Cardiovascular;  Laterality: N/A;   LEFT HEART CATH AND CORONARY ANGIOGRAPHY N/A 02/14/2023   Procedure: LEFT HEART CATH AND CORONARY ANGIOGRAPHY;  Surgeon: Swazil[place], Peter M, MD;  Location: [facility] INVASIVE CV LAB;  Service: Cardiovascular;  Laterality: N/A;   LEFT HEART CATHETERIZATION WITH CORONARY ANGIOGRAM N/A 01/07/2014   Procedure: LEFT HEART CATHETERIZATION WITH CORONARY ANGIOGRAM;  Surgeon: Laurey Morale, MD;  Location: [facility] CATH LAB;  Service: Cardiovascular;  Laterality: N/A;   TEE WITHOUT CARDIOVERSION N/A 02/19/2023   Procedure: TRANSESOPHAGEAL ECHOCARDIOGRAM;  Surgeon: Corliss Skains, MD;  Location: [facility] OR;  Service: Open Heart Surgery;  Laterality: N/A;     No outpatient medications have been marked as taking for the 06/18/23 encounter (Appointment) with  Jodelle Gross, NP.     Allergies:   Iohexol, Ticlid [ticlopidine hcl], Jardiance [empagliflozin], Metformin and related, and Codeine   Social History   Tobacco Use   Smoking status: Never   Smokeless tobacco: Never   Tobacco comments:    spouse, 41 years - husband has quit 01/2011  Vaping Use   Vaping status: Never Used  Substance Use Topics   Alcohol use: No   Drug use: No     Family Hx: The patient's family history includes Cervical cancer in her daughter; Colon cancer in her paternal aunt; Crohn's disease in her cousin; Diabetes in her brother, daughter, mother, and sister; GER disease in her daughter; Heart attack (age of onset: 59) in her brother; Heart attack (age of onset: 95) in her mother; Heart attack (age of onset: 30) in her father; Heart disease in her brother; Hypertension in her mother.  ROS:   Please see the history of present illness.     All other systems reviewed and are negative.  Prior CV studies:   The following studies were reviewed today:  Echocardiogram POST-OP IMPRESSIONS  _ Left Ventricle: The left ventricle is unchanged from pre-bypass.  _ Right Ventricle: The right ventricle appears unchanged from pre-bypass.  _ Aorta: The aorta appears unchanged from pre-bypass.  _ Left Atrial Appendage: The left atrial appendage appears unchanged from  pre-bypass.  _ Aortic Valve: The aortic valve appears unchanged from pre-bypass.  _ Mitral Valve: The mitral valve appears unchanged from pre-bypass.  _ Tricuspid Valve: The tricuspid valve appears unchanged from pre-bypass.  _ Interatrial Septum: The interatrial septum appears unchanged from  pre-bypass.  _ Pericardium: The pericardium appears unchanged from pre-bypass.  _ Comments: SP OP CABG X 1. No new or worsening wall motion or valvular  abnormalities.   PRE-OP FINDINGS   Left Ventricle: The left ventricle has normal systolic function, with an  ejection fraction of 60-65%. The cavity size was  normal. There is mild  concentric left ventricular hypertrophy. There is mild concentric left  ventricular hypertrophy. Left  ventricular diastolic parameters are consistent with Grade I diastolic  dysfunction (impaired relaxation).    LHC 02/14/2023 Prox LAD to Mid LAD lesion is 99% stenosed.   1st Diag lesion is 50% stenosed.   Prox Cx to Mid Cx lesion is 25% stenosed.   Mid Cx lesion is 25% stenosed.   Prox RCA to Mid RCA lesion is 25% stenosed.   Non-stenotic Mid LAD lesion was previously treated.   The left ventricular systolic function is normal.   LV end diastolic pressure is normal.   The left ventricular ejection fraction is 55-65% by visual estimate.   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 The vessel has 2 layers of stent in this area. It has previously been treated with high pressure [place] balloon dilation 4 months ago. Normal LV function Normal LVEDP    Labs/Other Tests and Data Reviewed:    EKG:  {EKG/Telemetry Strips Reviewed:[phone]}  Recent Labs: 05/09/2023: Magnesium 2.0 05/13/2023: ALT 11; Hemoglobin 11.9; Platelets 174 05/28/2023: BUN 10; Creatinine, Ser 1.28; Potassium 4.5; Sodium 139   Recent Lipid Panel Lab Results  Component Value Date/Time   CHOL 121 05/08/2023 08:08 AM   CHOL 107 02/05/2013 10:02 AM   TRIG 100 05/08/2023 08:08 AM   TRIG 149 11/30/2015 09:47 AM   TRIG 143 02/05/2013 10:02 AM   HDL 46 05/08/2023 08:08 AM   HDL 35 (L) 11/30/2015 09:47 AM   HDL 35 (L) 02/05/2013 10:02 AM   CHOLHDL 2.6 05/08/2023 08:08 AM   CHOLHDL 2.7 02/13/2023 01:08 AM   LDLCALC 56 05/08/2023 08:08 AM   LDLCALC 195 (H) 04/26/2014 10:29 AM   LDLCALC 43 02/05/2013 10:02 AM   LDLDIRECT 93 04/06/2015 10:38 AM    Wt Readings from Last 3 Encounters:  05/13/23 146 lb 8 oz (66.5 kg)  05/09/23 144 lb 4 oz (65.4 kg)  05/08/23 144 lb 4 oz (65.4 kg)     Objective:    Vital Signs:  There were no vitals taken for this visit.   {HeartCare Virtual Exam  (Optional):[phone]::"VITAL SIGNS:  reviewed"}  ASSESSMENT & PLAN:    ***  COVID-19 Education: The signs and symptoms of COVID-19 were discussed with the patient and how to seek care for testing (follow up with PCP or arrange E-visit).  ***The importance of social distancing was discussed today.  Time:   Today, I have spent *** minutes with the patient with telehealth technology discussing the above problems.  Medication Adjustments/Labs and Tests Ordered: Current medicines are reviewed at length with the patient today.  Concerns regarding medicines are outlined above.   Tests Ordered: No orders of the defined types were placed in this encounter.   Medication Changes: No orders of the defined types were placed in this encounter.   Disposition:  Follow up {follow up:15908}  Signed, Bettey Mare. Liborio Nixon, ANP, AACC  06/17/2023 12:06 PM    [facility]

## 2023-06-18 ENCOUNTER — Telehealth: Payer: Self-pay

## 2023-06-18 ENCOUNTER — Encounter: Payer: Self-pay | Admitting: Adult Health

## 2023-06-18 ENCOUNTER — Ambulatory Visit: Payer: PPO | Attending: Cardiovascular Disease | Admitting: Adult Health

## 2023-06-18 VITALS — Ht 66.0 in | Wt 144.0 lb

## 2023-06-18 DIAGNOSIS — I10 Essential (primary) hypertension: Secondary | ICD-10-CM

## 2023-06-18 DIAGNOSIS — E7800 Pure hypercholesterolemia, unspecified: Secondary | ICD-10-CM

## 2023-06-18 DIAGNOSIS — I2510 Atherosclerotic heart disease of native coronary artery without angina pectoris: Secondary | ICD-10-CM | POA: Diagnosis not present

## 2023-06-18 DIAGNOSIS — E876 Hypokalemia: Secondary | ICD-10-CM | POA: Diagnosis not present

## 2023-06-18 DIAGNOSIS — Z951 Presence of aortocoronary bypass graft: Secondary | ICD-10-CM | POA: Diagnosis not present

## 2023-06-18 NOTE — Patient Instructions (Signed)
Medication Instructions:  STOP AMIODARONE NO OTHER MEDICATION CHANGES   Lab Work: NO CHANGES   Testing/Procedures: NO CHANGES   Follow-Up: At [facility], you and your health needs are our priority.  As part of our continuing mission to provide you with exceptional heart care, we have created designated Provider Care Teams.  These Care Teams include your primary Cardiologist (physician) and Advanced Practice Providers (APPs -  Physician Assistants and Nurse Practitioners) who all work together to provide you with the care you need, when you need it.  We recommend signing up for the patient portal called "MyChart".  Sign up information is provided on this After Visit Summary.  MyChart is used to connect with patients for Virtual Visits (Telemedicine).  Patients are able to view lab/test results, encounter notes, upcoming appointments, etc.  Non-urgent messages can be sent to your provider as well.   To learn more about what you can do with MyChart, go to [email or web address].    Your next appointment:   KEEP SCHEDULED APPOINTMENT  Provider:   Rollene Rotunda, MD

## 2023-06-18 NOTE — Telephone Encounter (Signed)
  Patient Consent for Virtual Visit        [name] has provided verbal consent on 06/18/2023 for a virtual visit (video or telephone).   CONSENT FOR VIRTUAL VISIT FOR:  [name]  By participating in this virtual visit I agree to the following:  I hereby voluntarily request, consent and authorize [facility] and its employed or contracted physicians, physician assistants, nurse practitioners or other licensed health care professionals (the Practitioner), to provide me with telemedicine health care services (the "Services") as deemed necessary by the treating Practitioner. I acknowledge and consent to receive the Services by the Practitioner via telemedicine. I understand that the telemedicine visit will involve communicating with the Practitioner through live audiovisual communication technology and the disclosure of certain medical information by electronic transmission. I acknowledge that I have been given the opportunity to request an in-person assessment or other available alternative prior to the telemedicine visit and am voluntarily participating in the telemedicine visit.  I understand that I have the right to withhold or withdraw my consent to the use of telemedicine in the course of my care at any time, without affecting my right to future care or treatment, and that the Practitioner or I may terminate the telemedicine visit at any time. I understand that I have the right to inspect all information obtained and/or recorded in the course of the telemedicine visit and may receive copies of available information for a reasonable fee.  I understand that some of the potential risks of receiving the Services via telemedicine include:  Delay or interruption in medical evaluation due to technological equipment failure or disruption; Information transmitted may not be sufficient (e.g. poor resolution of images) to allow for appropriate medical decision making by the Practitioner;  and/or  In rare instances, security protocols could fail, causing a breach of personal health information.  Furthermore, I acknowledge that it is my responsibility to provide information about my medical history, conditions and care that is complete and accurate to the best of my ability. I acknowledge that Practitioner's advice, recommendations, and/or decision may be based on factors not within their control, such as incomplete or inaccurate data provided by me or distortions of diagnostic images or specimens that may result from electronic transmissions. I understand that the practice of medicine is not an exact science and that Practitioner makes no warranties or guarantees regarding treatment outcomes. I acknowledge that a copy of this consent can be made available to me via my patient portal [facility] MyChart), or I can request a printed copy by calling the office of [facility].    I understand that my insurance will be billed for this visit.   I have read or had this consent read to me. I understand the contents of this consent, which adequately explains the benefits and risks of the Services being provided via telemedicine.  I have been provided ample opportunity to ask questions regarding this consent and the Services and have had my questions answered to my satisfaction. I give my informed consent for the services to be provided through the use of telemedicine in my medical care

## 2023-06-19 ENCOUNTER — Encounter: Payer: Self-pay | Admitting: Family Medicine

## 2023-06-19 ENCOUNTER — Encounter: Payer: Self-pay | Admitting: Pharmacist

## 2023-06-19 ENCOUNTER — Ambulatory Visit: Payer: PPO | Admitting: Family Medicine

## 2023-06-19 VITALS — BP 134/72 | HR 89 | Temp 98.3°F | Ht 66.0 in | Wt 141.0 lb

## 2023-06-19 DIAGNOSIS — E876 Hypokalemia: Secondary | ICD-10-CM | POA: Diagnosis not present

## 2023-06-19 DIAGNOSIS — F411 Generalized anxiety disorder: Secondary | ICD-10-CM | POA: Diagnosis not present

## 2023-06-19 DIAGNOSIS — Z794 Long term (current) use of insulin: Secondary | ICD-10-CM

## 2023-06-19 DIAGNOSIS — Z23 Encounter for immunization: Secondary | ICD-10-CM | POA: Diagnosis not present

## 2023-06-19 DIAGNOSIS — N1832 Chronic kidney disease, stage 3b: Secondary | ICD-10-CM

## 2023-06-19 DIAGNOSIS — E1165 Type 2 diabetes mellitus with hyperglycemia: Secondary | ICD-10-CM

## 2023-06-19 DIAGNOSIS — F339 Major depressive disorder, recurrent, unspecified: Secondary | ICD-10-CM

## 2023-06-19 LAB — BMP8+EGFR
BUN/Creatinine Ratio: 10 — ABNORMAL LOW (ref 12–28)
BUN: 15 mg/dL (ref 8–27)
CO2: 22 mmol/L (ref 20–29)
Calcium: 9.6 mg/dL (ref 8.7–10.3)
Chloride: 103 mmol/L (ref 96–106)
Creatinine, Ser: 1.51 mg/dL — ABNORMAL HIGH (ref 0.57–1.00)
Glucose: 233 mg/dL — ABNORMAL HIGH (ref 70–99)
Potassium: 5.2 mmol/L (ref 3.5–5.2)
Sodium: 139 mmol/L (ref 134–144)
eGFR: 36 mL/min/{1.73_m2} — ABNORMAL LOW (ref >59)

## 2023-06-19 MED ORDER — BLOOD GLUCOSE TEST VI STRP: 1.0000 | ORAL_STRIP | Freq: Three times a day (TID) | 0 refills | Status: AC

## 2023-06-19 MED ORDER — LANCET DEVICE MISC
1.0000 | Freq: Three times a day (TID) | 0 refills | Status: AC
Start: 2023-06-19 — End: 2023-07-19

## 2023-06-19 MED ORDER — LANCETS MISC. MISC: 1.0000 | Freq: Three times a day (TID) | 0 refills | Status: AC

## 2023-06-19 MED ORDER — BLOOD GLUCOSE MONITORING SUPPL DEVI
1.0000 | Freq: Three times a day (TID) | 0 refills | Status: AC
Start: 2023-06-19 — End: ?

## 2023-06-19 NOTE — Progress Notes (Signed)
Established Patient Office Visit  Subjective   Patient ID: [name], female    DOB: 04-17-1951  Age: 72 y.o. MRN: [id number]  Chief Complaint  Patient presents with   Anxiety    Anxiety      [name] is here for a follow up of anxiety and depression after restarting on zoloft. She reports feeling much better and more like herself. She denies side effects.   She has been taking her potassium supplement TID. She denies palpitations chest pain, shortness of breath, edema.   She is ready now for a nephrology referral. She has been trying to increase her water intake.   She has been using her CGM. She no longer has a fingerstick glucometer and she would like a prescription for one today. Average blood sugar has been 200, no lows recently.      06/19/2023    8:11 AM 05/08/2023    8:02 AM 02/04/2023    8:07 AM  Depression screen PHQ 2/9  Decreased Interest 0 0 1  Down, Depressed, Hopeless 0 3 0  PHQ - 2 Score 0 3 1  Altered sleeping 0 3 1  Tired, decreased energy 0 3 3  Change in appetite 2 0 2  Feeling bad or failure about yourself  0 0 0  Trouble concentrating 0 0 0  Moving slowly or fidgety/restless 0 0 0  Suicidal thoughts 0 0 0  PHQ-9 Score 2 9 7   Difficult doing work/chores Not difficult at all Somewhat difficult Somewhat difficult      06/19/2023    8:11 AM 05/08/2023    8:02 AM 02/04/2023    8:08 AM 09/20/2022    8:28 AM  GAD 7 : Generalized Anxiety Score  Nervous, Anxious, on Edge 0 3 1 2   Control/stop worrying 0 3 1 1   Worry too much - different things 0 3 1 1   Trouble relaxing 0 3 1 2   Restless 0 3 0 1  Easily annoyed or irritable 0 3 0 2  Afraid - awful might happen 0 0 0 1  Total GAD 7 Score 0 18 4 10   Anxiety Difficulty Not difficult at all Somewhat difficult Not difficult at all Somewhat difficult         ROS    Objective:     BP 134/72   Pulse 89   Temp 98.3 F (36.8 C) (Temporal)   Ht 5\' 6"  (1.676 m)   Wt 141 lb (64 kg)   SpO2 99%    BMI 22.76 kg/m    Physical Exam Vitals and nursing note reviewed.  Constitutional:      General: She is not in acute distress.    Appearance: She is not ill-appearing, toxic-appearing or diaphoretic.  Cardiovascular:     Rate and Rhythm: Normal rate and regular rhythm.     Heart sounds: Normal heart sounds. No murmur heard. Pulmonary:     Effort: No respiratory distress.     Breath sounds: No wheezing, rhonchi or rales.  Musculoskeletal:     Right lower leg: No edema.     Left lower leg: No edema.  Skin:    General: Skin is warm and dry.  Neurological:     Mental Status: She is alert and oriented to person, place, and time. Mental status is at baseline.  Psychiatric:        Mood and Affect: Mood normal.        Behavior: Behavior normal.  Thought Content: Thought content normal.        Judgment: Judgment normal.      No results found for any visits on 06/19/23.    The ASCVD Risk score (Arnett DK, et al., 2019) failed to calculate for the following reasons:   The patient has a prior MI or stroke diagnosis    Assessment & Plan:   Tannette was seen today for anxiety.  Diagnoses and all orders for this visit:  Depression, recurrent (HCC) Generalized anxiety disorder Well controlled on current regimen.   Hypokalemia Will recheck potassium today as she has been taking her supplement TID.  -     BMP8+EGFR  Uncontrolled type 2 diabetes mellitus with hyperglycemia, with long-term current use of insulin (HCC) Fingerstick glucometer ordered today as she does not have one. Discussed this is the gold standard to use if her gets an unusual reading on her CGM.  -     Blood Glucose Monitoring Suppl DEVI; 1 each by Does not apply route in the morning, at noon, and at bedtime. May substitute to any manufacturer covered by patient's insurance. -     Glucose Blood (BLOOD GLUCOSE TEST STRIPS) STRP; 1 each by In Vitro route in the morning, at noon, and at bedtime. May substitute  to any manufacturer covered by patient's insurance. -     Lancet Device MISC; 1 each by Does not apply route in the morning, at noon, and at bedtime. May substitute to any manufacturer covered by patient's insurance. -     Lancets Misc. MISC; 1 each by Does not apply route in the morning, at noon, and at bedtime. May substitute to any manufacturer covered by patient's insurance.  Stage 3b chronic kidney disease [facility]) Referral discussed and placed.  -     Ambulatory referral to Nephrology  Encounter for immunization -     Flu Vaccine Trivalent High Dose (Fluad)   Keep scheduled follow up appt, sooner for new or worsening symptoms.   The patient indicates understanding of these issues and agrees with the plan.   Gabriel Earing, FNP

## 2023-06-19 NOTE — Progress Notes (Signed)
Pharmacy Quality Measure Review  This patient is appearing on a report for being at risk of failing the adherence measure for hypertension (ACEi/ARB) medications this calendar year.   Medication: lisinopril Last fill date: 04/03/23 for 90 day supply  Insurance report was not up to date. No action needed at this time.    Jarrett Ables, PharmD PGY-1 Pharmacy Resident

## 2023-06-24 DIAGNOSIS — L570 Actinic keratosis: Secondary | ICD-10-CM | POA: Diagnosis not present

## 2023-06-24 DIAGNOSIS — D485 Neoplasm of uncertain behavior of skin: Secondary | ICD-10-CM | POA: Diagnosis not present

## 2023-06-24 DIAGNOSIS — L821 Other seborrheic keratosis: Secondary | ICD-10-CM | POA: Diagnosis not present

## 2023-06-24 DIAGNOSIS — Z1283 Encounter for screening for malignant neoplasm of skin: Secondary | ICD-10-CM | POA: Diagnosis not present

## 2023-06-30 ENCOUNTER — Encounter: Payer: Self-pay | Admitting: Pharmacist

## 2023-06-30 NOTE — Progress Notes (Signed)
Pharmacy Quality Measure Review  This patient is appearing on a report for being at risk of failing the adherence measure for cholesterol (statin) medications this calendar year.   Medication: rosuvastatin 20 mg Last fill date: 9/22 for 90 day supply  Insurance report was not up to date. No action needed at this time.   Jarrett Ables, PharmD PGY-1 Pharmacy Resident

## 2023-07-09 ENCOUNTER — Telehealth: Payer: PPO

## 2023-07-13 DIAGNOSIS — S0990XA Unspecified injury of head, initial encounter: Secondary | ICD-10-CM | POA: Diagnosis not present

## 2023-07-13 DIAGNOSIS — S0003XA Contusion of scalp, initial encounter: Secondary | ICD-10-CM | POA: Diagnosis not present

## 2023-07-13 DIAGNOSIS — I2510 Atherosclerotic heart disease of native coronary artery without angina pectoris: Secondary | ICD-10-CM | POA: Diagnosis not present

## 2023-07-13 DIAGNOSIS — Z888 Allergy status to other drugs, medicaments and biological substances status: Secondary | ICD-10-CM | POA: Diagnosis not present

## 2023-07-13 DIAGNOSIS — E876 Hypokalemia: Secondary | ICD-10-CM | POA: Diagnosis not present

## 2023-07-13 DIAGNOSIS — E1122 Type 2 diabetes mellitus with diabetic chronic kidney disease: Secondary | ICD-10-CM | POA: Diagnosis not present

## 2023-07-13 DIAGNOSIS — N1832 Chronic kidney disease, stage 3b: Secondary | ICD-10-CM | POA: Diagnosis not present

## 2023-07-13 DIAGNOSIS — Z90710 Acquired absence of both cervix and uterus: Secondary | ICD-10-CM | POA: Diagnosis not present

## 2023-07-13 DIAGNOSIS — R42 Dizziness and giddiness: Secondary | ICD-10-CM | POA: Diagnosis not present

## 2023-07-13 DIAGNOSIS — Z91041 Radiographic dye allergy status: Secondary | ICD-10-CM | POA: Diagnosis not present

## 2023-07-13 DIAGNOSIS — Z885 Allergy status to narcotic agent status: Secondary | ICD-10-CM | POA: Diagnosis not present

## 2023-07-16 ENCOUNTER — Other Ambulatory Visit: Payer: PPO

## 2023-07-16 ENCOUNTER — Other Ambulatory Visit (HOSPITAL_COMMUNITY): Payer: Self-pay | Admitting: Nephrology

## 2023-07-16 DIAGNOSIS — R809 Proteinuria, unspecified: Secondary | ICD-10-CM | POA: Diagnosis not present

## 2023-07-16 DIAGNOSIS — N189 Chronic kidney disease, unspecified: Secondary | ICD-10-CM | POA: Diagnosis not present

## 2023-07-16 DIAGNOSIS — N1832 Chronic kidney disease, stage 3b: Secondary | ICD-10-CM | POA: Diagnosis not present

## 2023-07-16 DIAGNOSIS — E876 Hypokalemia: Secondary | ICD-10-CM | POA: Diagnosis not present

## 2023-07-16 DIAGNOSIS — E1122 Type 2 diabetes mellitus with diabetic chronic kidney disease: Secondary | ICD-10-CM | POA: Diagnosis not present

## 2023-07-16 DIAGNOSIS — E1129 Type 2 diabetes mellitus with other diabetic kidney complication: Secondary | ICD-10-CM | POA: Diagnosis not present

## 2023-07-16 DIAGNOSIS — I5032 Chronic diastolic (congestive) heart failure: Secondary | ICD-10-CM | POA: Diagnosis not present

## 2023-07-17 DIAGNOSIS — N6312 Unspecified lump in the right breast, upper inner quadrant: Secondary | ICD-10-CM | POA: Diagnosis not present

## 2023-07-17 DIAGNOSIS — R928 Other abnormal and inconclusive findings on diagnostic imaging of breast: Secondary | ICD-10-CM | POA: Diagnosis not present

## 2023-07-17 DIAGNOSIS — N6310 Unspecified lump in the right breast, unspecified quadrant: Secondary | ICD-10-CM | POA: Diagnosis not present

## 2023-07-17 DIAGNOSIS — R92321 Mammographic fibroglandular density, right breast: Secondary | ICD-10-CM | POA: Diagnosis not present

## 2023-07-18 ENCOUNTER — Encounter: Payer: Self-pay | Admitting: Family Medicine

## 2023-07-18 ENCOUNTER — Ambulatory Visit: Payer: PPO | Admitting: Family Medicine

## 2023-07-18 ENCOUNTER — Other Ambulatory Visit (HOSPITAL_COMMUNITY): Payer: PPO

## 2023-07-18 VITALS — BP 134/67 | HR 81 | Temp 98.9°F | Ht 66.0 in | Wt 145.0 lb

## 2023-07-18 DIAGNOSIS — S0990XD Unspecified injury of head, subsequent encounter: Secondary | ICD-10-CM | POA: Diagnosis not present

## 2023-07-18 DIAGNOSIS — R269 Unspecified abnormalities of gait and mobility: Secondary | ICD-10-CM

## 2023-07-18 DIAGNOSIS — R42 Dizziness and giddiness: Secondary | ICD-10-CM | POA: Diagnosis not present

## 2023-07-18 NOTE — Progress Notes (Signed)
/  Acute Office Visit  Subjective:     Patient ID: [name], female    DOB: 04-Dec-1950, 72 y.o.   MRN: [id number]  Chief Complaint  Patient presents with   Dizziness    Dizziness   Patient is in today for an ER follow up. Aaisha presented to [facility] ER on 07/13/23. Per ER note:   "[name] is a 72 y.o. female has had spinning-like sensation episodes multiple times during 1 year she had another 1 today while she was in the bathroom combing her hair which she says felt like a tornado or a spinning sensation she hit her head right parietal against a corner table. No loss of consciousness but she does have a headache no neck pain or injury no other injuries to the arms shoulders chest back back abdomen belly legs or any other part of the body. No recent illness no recent fever chills nausea vomiting diarrhea. Patient did not have any loss of consciousness no recent cold or flulike symptoms no bleeding recently no recent chest pains shortness of breath ringing in the ears loss of hearing slurred speech or weakness of the arms or legs. No recent abdominal pain or back pain. No recent vaginal bleeding or discharge."  She had a CT of her head with the following results:  IMPRESSION: 1. Right parietal scalp hematoma. No acute intracranial abnormality. No skull fracture. 2. Stable atrophy and chronic small vessel ischemia   No labs were done. Had labs done on 07/16/23 for nephrology- stable kidney function, normal electrolytes, CBC.   Denies dizziness since this episode. She continues to have a mild right sided headache. Denies visual disturbances, nausea, vomiting, focal weakness. Feels a general weakness- "legs feel like spaghetti". Getting lightheaded when standing a few times a week that last for a few minutes. Hx of orthostatic hypotension. She has tried meclizine with good relief of vertigo. Continue to drink minimal water a day - 24 ounces of water a day. She hasn't been drinking  any electrolyte fluids.   ER recommended outpatient MRI as MRI was not available in the ER that day.   Review of Systems  Neurological:  Positive for dizziness.   As per HPI.      Objective:    BP 134/67   Pulse 81   Temp 98.9 F (37.2 C) (Temporal)   Ht 5\' 6"  (1.676 m)   Wt 145 lb (65.8 kg)   SpO2 97%   BMI 23.40 kg/m    Physical Exam Vitals and nursing note reviewed.  Constitutional:      General: She is not in acute distress.    Appearance: She is not ill-appearing, toxic-appearing or diaphoretic.  HENT:     Head:     Comments: Right parietal scalp hematoma    Right Ear: Tympanic membrane, ear canal and external ear normal.     Left Ear: Tympanic membrane, ear canal and external ear normal.     Nose: Nose normal.     Mouth/Throat:     Mouth: Mucous membranes are moist.     Pharynx: Oropharynx is clear. No oropharyngeal exudate or posterior oropharyngeal erythema.  Eyes:     General:        Right eye: No discharge.        Left eye: No discharge.     Extraocular Movements: Extraocular movements intact.     Pupils: Pupils are equal, round, and reactive to light.  Cardiovascular:     Rate  and Rhythm: Normal rate and regular rhythm.     Heart sounds: Normal heart sounds. No murmur heard. Pulmonary:     Effort: Pulmonary effort is normal.     Breath sounds: Normal breath sounds.  Abdominal:     General: Bowel sounds are normal.     Palpations: Abdomen is soft.  Musculoskeletal:     Cervical back: Neck supple. No rigidity.     Right lower leg: No edema.     Left lower leg: No edema.  Skin:    General: Skin is warm.  Neurological:     Mental Status: She is alert and oriented to person, place, and time.     Cranial Nerves: No facial asymmetry.     Sensory: Sensation is intact.     Motor: Motor function is intact.     Coordination: Coordination is intact.     Gait: Tandem walk abnormal.  Psychiatric:        Mood and Affect: Mood normal.        Behavior:  Behavior normal.     No results found for any visits on 07/18/23.      Assessment & Plan:   Timarie was seen today for dizziness.  Diagnoses and all orders for this visit:  Vertigo -     MR Brain Wo Contrast; Future  Injury of head, subsequent encounter -     MR Brain Wo Contrast; Future  Abnormal tandem walk -     MR Brain Wo Contrast; Future    Stat MRI ordered. Had negative CT of head on 07/13/23 following vertigo episode that resulted in fall with mild head injury. She does have an abnormal tandem walk today. Will determine plan or care pending MRI results.   The patient indicates understanding of these issues and agrees with the plan.  Gabriel Earing, FNP

## 2023-07-19 ENCOUNTER — Other Ambulatory Visit (INDEPENDENT_AMBULATORY_CARE_PROVIDER_SITE_OTHER): Payer: PPO | Admitting: Pharmacist

## 2023-07-19 DIAGNOSIS — E1165 Type 2 diabetes mellitus with hyperglycemia: Secondary | ICD-10-CM

## 2023-07-19 DIAGNOSIS — Z794 Long term (current) use of insulin: Secondary | ICD-10-CM

## 2023-07-20 ENCOUNTER — Ambulatory Visit (HOSPITAL_COMMUNITY)
Admission: RE | Admit: 2023-07-20 | Discharge: 2023-07-20 | Disposition: A | Discharge status: Home / Self Care | Payer: PPO | Source: Ambulatory Visit | Attending: Family Medicine | Admitting: Family Medicine

## 2023-07-20 DIAGNOSIS — R42 Dizziness and giddiness: Secondary | ICD-10-CM | POA: Insufficient documentation

## 2023-07-20 DIAGNOSIS — G319 Degenerative disease of nervous system, unspecified: Secondary | ICD-10-CM | POA: Diagnosis not present

## 2023-07-20 DIAGNOSIS — S0990XD Unspecified injury of head, subsequent encounter: Secondary | ICD-10-CM | POA: Diagnosis not present

## 2023-07-20 DIAGNOSIS — R269 Unspecified abnormalities of gait and mobility: Secondary | ICD-10-CM | POA: Insufficient documentation

## 2023-07-20 DIAGNOSIS — R531 Weakness: Secondary | ICD-10-CM | POA: Diagnosis not present

## 2023-07-20 DIAGNOSIS — S0990XA Unspecified injury of head, initial encounter: Secondary | ICD-10-CM | POA: Diagnosis not present

## 2023-07-23 ENCOUNTER — Ambulatory Visit (HOSPITAL_COMMUNITY)
Admission: RE | Admit: 2023-07-23 | Discharge: 2023-07-23 | Disposition: A | Discharge status: Home / Self Care | Payer: PPO | Source: Ambulatory Visit | Attending: Nephrology | Admitting: Nephrology

## 2023-07-23 DIAGNOSIS — N189 Chronic kidney disease, unspecified: Secondary | ICD-10-CM | POA: Diagnosis not present

## 2023-07-23 DIAGNOSIS — N1832 Chronic kidney disease, stage 3b: Secondary | ICD-10-CM | POA: Diagnosis not present

## 2023-07-26 MED ORDER — SEMAGLUTIDE (2 MG/DOSE) 8 MG/3ML ~~LOC~~ SOPN
2.0000 mg | PEN_INJECTOR | SUBCUTANEOUS | Status: DC
Start: 2023-07-26 — End: 2024-01-16

## 2023-07-26 NOTE — Progress Notes (Signed)
07/19/2023 Name: [name] MRN: [id number] DOB: 1951/02/22  Chief Complaint  Patient presents with   Diabetes    [name] is a 72 y.o. year old female who presented for a telephone visit.  I connected with  Renato Gails on 07/19/23 by telephone and verified that I am speaking with the correct person using two identifiers. I discussed the limitations of evaluation and management by telemedicine. The patient expressed understanding and agreed to proceed.  Patient was located in her home and PharmD in PCP office during this visit.   They were referred to the pharmacist by their PCP for assistance in managing diabetes, medication access, and complex medication management.   She is status post recent fall which resulted in ED visit and is still experiencing residual adverse events.  She states her blood sugar is well controlled at this time.  We will ensure she is re-enrolled in patient assistance for 2025 [employer] Nordisk PAP for T2DM meds).   Subjective:  Care Team: Primary Care Provider: Gabriel Earing, FNP   Medication Access/Adherence  Current Pharmacy:  [facility] - [facility], [place] - [address] [place] [address] Phone: [phone] Fax: [phone]   Patient reports affordability concerns with their medications: Yes  Patient reports access/transportation concerns to their pharmacy: No  Patient reports adherence concerns with their medications:  No     Diabetes:   Current treatment: Tresiba 60 units, Novolog 30-32 units 2x w/ meals (only eats 2 meals daily), Ozempic 2mg  weekly;  Past intolerances: metformin, Farxiga (SGLT2s), Invokana, Trulicity, Humalog    Current glucose readings: FBG<140 patient reports her blood sugars have never been better Using Libre 3 CGM; testing continuously; uses reader    -Patient is injecting insulin 4 or more times daily -She greatly benefits from her continuous glucose monitoring system (libre 3)    Patient denies hypoglycemic s/sx including dizziness, shakiness, sweating. Patient denies hyperglycemic symptoms including polyuria, polydipsia, polyphagia, nocturia, neuropathy, blurred vision.   Current meal patterns:  Discussed meal planning options and Plate method for healthy eating Avoid sugary drinks and desserts Incorporate balanced protein, non starchy veggies, 1 serving of carbohydrate with each meal Increase water intake Increase physical activity as able    Current physical activity: cardiac rehab   Current medication access support: novo nordisk PAP  Objective:  Lab Results  Component Value Date   HGBA1C 7.3 (H) 05/08/2023    Lab Results  Component Value Date   CREATININE 1.51 (H) 06/19/2023   BUN 15 06/19/2023   NA 139 06/19/2023   K 5.2 06/19/2023   CL 103 06/19/2023   CO2 22 06/19/2023    Lab Results  Component Value Date   CHOL 121 05/08/2023   HDL 46 05/08/2023   LDLCALC 56 05/08/2023   LDLDIRECT 93 04/06/2015   TRIG 100 05/08/2023   CHOLHDL 2.6 05/08/2023    Medications Reviewed Today     Reviewed by Danella Maiers, [facility] (Pharmacist) on 07/26/23 at [phone]  Med List Status: <None>   Medication Order Taking? Sig Documenting Provider Last Dose Status Informant  alendronate (FOSAMAX) 70 MG tablet [id number] No Take 1 tablet (70 mg total) by mouth every 7 (seven) days. Take with a full glass of water on an empty stomach. Gabriel Earing, FNP Taking Active Self, Pharmacy Records  aspirin 81 MG chewable tablet [id number] No Chew 1 tablet (81 mg total) by mouth daily. Cathren Harsh, MD Taking Active  Self, Pharmacy Records  Blood Glucose Monitoring Suppl DEVI [id number] No 1 each by Does not apply route in the morning, at noon, and at bedtime. May substitute to any manufacturer covered by patient's insurance. Gabriel Earing, FNP Taking Active   clopidogrel (PLAVIX) 75 MG tablet [id number] No Take 1 tablet (75 mg total) by mouth daily with breakfast. Cathren Harsh, MD Taking Active Self, Pharmacy Records           Med Note Mayford Knife, [place] May 09, 2023 11:32 AM)    Continuous Glucose Sensor (FREESTYLE LIBRE 3 SENSOR) [place] [id number] No PLACE 1 SENSOR ON THE SKIN EVERY 14 DAYS Hawks, Christy A, FNP Taking Active   diphenhydrAMINE (BENADRYL) 25 mg capsule [id number] No Take 25 mg by mouth every 6 (six) hours as needed for allergies. [provider] Taking Active Self, Pharmacy Records  furosemide (LASIX) 20 MG tablet [id number] No Take 1 tablet (20 mg total) by mouth daily. Gabriel Earing, FNP Taking Active Self, Pharmacy Records  insulin aspart (NOVOLOG FLEXPEN) 100 UNIT/ML FlexPen [id number] No Inject 36 Units into the skin 3 (three) times daily with meals.  Patient taking differently: Inject 25-36 Units into the skin 3 (three) times daily with meals.   Joseph Art, DO Taking Active Self, Pharmacy Records           Med Note Mayford Knife, [place] S   Thu May 09, 2023 11:34 AM)    insulin degludec (TRESIBA) 200 UNIT/ML FlexTouch Pen [id number] No Inject 30-60 Units into the skin daily. Pt states she takes 60 units if she eats and 30 units if she does not eat [provider] Taking Active Self, Pharmacy Records           Med Note Cresenciano Genre, Lilla Shook   Tue May 07, 2023  1:18 PM) Via novo nordisk patient assistance program   levocetirizine (XYZAL) 5 MG tablet [id number] No Take 0.5 tablets (2.5 mg total) by mouth every evening. Gabriel Earing, FNP Taking Active Self, Pharmacy Records  lisinopril (ZESTRIL) 10 MG tablet [id number] No Take 1 tablet (10 mg total) by mouth daily. Rollene Rotunda, MD Taking Active Self, Pharmacy Records  metoprolol tartrate (LOPRESSOR) 25 MG tablet [id number] No Take 0.5 tablets (12.5 mg total) by mouth 2 (two) times daily. Leary Roca, PA-C Taking Active Self, Pharmacy Records  nitroGLYCERIN (NITROSTAT) 0.4 MG SL tablet [id number] No DISSOLVE 1 TABLET UNDER TONGUE FOR CHESTPAIN.MAY REPEAT EVERY 5  MINUTES FOR 3 DOSES.IF NO RELIEF CALL 911 OR GO TO ER Rai, Delene Ruffini, MD Taking Active Self, Pharmacy Records  potassium chloride (KLOR-CON) 10 MEQ tablet [id number] No TAKE ONE (1) TABLET BY MOUTH TWO (2) TIMES DAILY Gabriel Earing, FNP Taking Active   rOPINIRole (REQUIP) 0.5 MG tablet [id number] No Take 2 tablets (1 mg total) by mouth daily as needed (restless leg). Joseph Art, DO Taking Active Self, Pharmacy Records  rosuvastatin (CRESTOR) 20 MG tablet [id number] No TAKE ONE (1) TABLET BY MOUTH EVERY DAY Gabriel Earing, FNP Taking Active   Semaglutide, 1 MG/DOSE, (OZEMPIC, 1 MG/DOSE,) 2 MG/1.5ML SOPN [id number] No Inject 2 mg into the skin once a week. [provider] Taking Active Self, Pharmacy Records           Med Note Donn Pierini Feb 07, 2023 10:58 AM) Via novo nordisk patient assistance program Takes on Thursdays   sertraline (ZOLOFT) 50 MG tablet [id number] No Take 1 tablet (  50 mg total) by mouth daily. Gabriel Earing, FNP Taking Active Self, Pharmacy Records  traZODone (DESYREL) 50 MG tablet [id number] No Take 1 tablet (50 mg total) by mouth at bedtime as needed for sleep. Gabriel Earing, FNP Taking Active Self, Pharmacy Records  Med List Note Gabriel Earing, [facility] 08/09/21 1015): nov              Assessment/Plan:   Assessment/Plan:    Diabetes: - Currently uncontrolled, but improving per patient report - Reviewed long term cardiovascular and renal outcomes of uncontrolled blood sugar--GFR 52 (01/2023) -->36 (06/19/23)  May explore Kerendia (intolerant to SGLT2s) at follow up--patient assistance for this medication has been difficult - Reviewed goal A1c, goal fasting, and goal 2 hour post prandial glucose - Reviewed dietary modifications including FOLLOWING A HEART HEALTHY DIET/HEALTHY PLATE METHOD             The patient is asked to make an attempt to improve diet and exercise patterns to aid in medical management of this problem. - Recommend  to :             Continue Ozempic 2mg  weekly--patient denies personal or family history of medullary thyroid carcinoma (MTC) or in patients with Multiple Endocrine Neoplasia syndrome type 2 (MEN 2) Continue Tresiba 50 units nightly Continue Novolog 30-32 with meals twice daily (only eats about 2 meals/day)  -Recommend to check glucose continuously using Libre 3 CGM -per patient & progress notes, beta blocker dose was decreased due to orthostatic hypotension (following closely with cardiology) -message routed to CPhT to follow re-enrollment for 2025 novo nordisk     Follow Up Plan: PCP; PharmD in 3-6 months    Kieth Brightly, PharmD, BCACP, CPP Clinical Pharmacist, [facility]

## 2023-07-29 ENCOUNTER — Telehealth: Payer: Self-pay | Admitting: Family Medicine

## 2023-07-29 NOTE — Telephone Encounter (Signed)
I spoke to pt and advised she has an appt 10/09/23 with Tiffany for chronic follow up and asked if she needed to be seen prior for something else and pt states no.

## 2023-08-01 ENCOUNTER — Encounter: Payer: Self-pay | Admitting: Family Medicine

## 2023-08-01 ENCOUNTER — Telehealth: Payer: Self-pay | Admitting: Family Medicine

## 2023-08-01 NOTE — Telephone Encounter (Signed)
Pt called and spoke to [facility] Rep. Wanted to leave a message for [name] to let her know that she still has not received any paperwork in the mail. Asked Rep what the paperwork was regarding. Rep was unsure. Looking through pts recent chart notes, it looks like it could be re-enrollment paperwork with [employer]. Please advise.

## 2023-08-02 NOTE — Telephone Encounter (Signed)
Made patient aware. She will call to let us know that she received paperwork, once she receives it. Also will let us know if she has any questions about it.

## 2023-08-05 DIAGNOSIS — N1832 Chronic kidney disease, stage 3b: Secondary | ICD-10-CM | POA: Diagnosis not present

## 2023-08-05 DIAGNOSIS — I5032 Chronic diastolic (congestive) heart failure: Secondary | ICD-10-CM | POA: Diagnosis not present

## 2023-08-05 DIAGNOSIS — E538 Deficiency of other specified B group vitamins: Secondary | ICD-10-CM | POA: Diagnosis not present

## 2023-08-05 DIAGNOSIS — E1122 Type 2 diabetes mellitus with diabetic chronic kidney disease: Secondary | ICD-10-CM | POA: Diagnosis not present

## 2023-08-15 ENCOUNTER — Telehealth: Payer: Self-pay

## 2023-08-15 NOTE — Telephone Encounter (Signed)
Mailed novo nordisk renewal application to pt's home.

## 2023-08-23 DIAGNOSIS — Z7902 Long term (current) use of antithrombotics/antiplatelets: Secondary | ICD-10-CM | POA: Diagnosis not present

## 2023-08-23 DIAGNOSIS — S91032D Puncture wound without foreign body, left ankle, subsequent encounter: Secondary | ICD-10-CM | POA: Diagnosis not present

## 2023-08-23 DIAGNOSIS — S81831D Puncture wound without foreign body, right lower leg, subsequent encounter: Secondary | ICD-10-CM | POA: Diagnosis not present

## 2023-08-23 DIAGNOSIS — E1151 Type 2 diabetes mellitus with diabetic peripheral angiopathy without gangrene: Secondary | ICD-10-CM | POA: Diagnosis not present

## 2023-08-23 DIAGNOSIS — E1122 Type 2 diabetes mellitus with diabetic chronic kidney disease: Secondary | ICD-10-CM | POA: Diagnosis not present

## 2023-08-23 DIAGNOSIS — Z7982 Long term (current) use of aspirin: Secondary | ICD-10-CM | POA: Diagnosis not present

## 2023-08-23 DIAGNOSIS — I130 Hypertensive heart and chronic kidney disease with heart failure and stage 1 through stage 4 chronic kidney disease, or unspecified chronic kidney disease: Secondary | ICD-10-CM | POA: Diagnosis not present

## 2023-08-23 DIAGNOSIS — N1831 Chronic kidney disease, stage 3a: Secondary | ICD-10-CM | POA: Diagnosis not present

## 2023-08-23 DIAGNOSIS — I5030 Unspecified diastolic (congestive) heart failure: Secondary | ICD-10-CM | POA: Diagnosis not present

## 2023-08-23 DIAGNOSIS — F339 Major depressive disorder, recurrent, unspecified: Secondary | ICD-10-CM | POA: Diagnosis not present

## 2023-08-26 ENCOUNTER — Ambulatory Visit: Payer: Self-pay | Admitting: Family Medicine

## 2023-08-26 NOTE — Telephone Encounter (Signed)
   Chief Complaint: bleeding Symptoms: bleeding bilateral ankles, bilateral ankle swelling Pertinent Negatives: Patient denies fever, uncontrollable bleeding Disposition: [] ED /[] Urgent Care (no appt availability in office) / [x] Appointment(In office/virtual)/ []  [facility]/ [] Home Care/ [] Refused Recommended Disposition /[] [place] [facility]/ []  Follow-up with PCP Additional Notes: Patient called in reporting that 2 days ago she noticed that she had dried blood in her sock on her left ankle. This morning she reports that she woke up and saw blood on the back of her right ankle. Patient reports that she has bilateral ankle swelling and redness, and that she might have gotten a tiny cut on her ankles from a rock in her socks but she doesn't know for sure. Patient reports that she does take blood thinners but that the bleeding is able to be controlled. Per protocol, this RN scheduled in office appt 12/3. Patient verbalized understanding.   Copied from CRM [id number]. Topic: Appointments - Appointment Scheduling >> Aug 26, 2023  4:45 PM Hector Shade B wrote: Patient/patient representative is calling to schedule an appointment. Refer to attachments for appointment information.  Reason for Disposition  [1] Looks infected (spreading redness, pus) AND [2] no fever  Answer Assessment - Initial Assessment Questions 1. APPEARANCE of INJURY: "What does the injury look like?"      Just swollen ankles and a tiny cut 2. SIZE: "How large is the cut?"      Pen tip 3. BLEEDING: "Is it bleeding now?" If Yes, ask: "Is it difficult to stop?"       4. LOCATION: "Where is the injury located?"      Both ankles 5. ONSET: "How long ago did the injury occur?"      2 days ago 6. MECHANISM: "Tell me how it happened."      I'm not sure if it's swelling or if I got a scrape from a rock in my sock  Protocols used: Cuts and Lacerations-A-AH

## 2023-08-27 ENCOUNTER — Encounter: Payer: Self-pay | Admitting: Family Medicine

## 2023-08-27 ENCOUNTER — Ambulatory Visit (INDEPENDENT_AMBULATORY_CARE_PROVIDER_SITE_OTHER): Payer: PPO | Admitting: Family Medicine

## 2023-08-27 VITALS — BP 172/81 | HR 77 | Temp 96.9°F | Wt 152.0 lb

## 2023-08-27 DIAGNOSIS — I2510 Atherosclerotic heart disease of native coronary artery without angina pectoris: Secondary | ICD-10-CM

## 2023-08-27 DIAGNOSIS — I8390 Asymptomatic varicose veins of unspecified lower extremity: Secondary | ICD-10-CM | POA: Diagnosis not present

## 2023-08-27 NOTE — Progress Notes (Signed)
Subjective:  Patient ID: [name], female    DOB: 1951/04/29  Age: 72 y.o. MRN: [id number]  CC: Joint Swelling   HPI [name] presents for sudden onset of bleeding at the ankles on Thanksgiving evening.  There was no known injury.  She has had multiple varicosities at the ankles for an extended time.  She has been keeping the legs elevated and the symptoms have essentially resolved.  The patient brought in pictures showing the redness and bleeding in the region of the lateral malleoli bilaterally     08/27/2023    4:13 PM 08/27/2023    4:11 PM 07/18/2023    9:07 AM  Depression screen PHQ 2/9  Decreased Interest 1 1 1   Down, Depressed, Hopeless 1 1 0  PHQ - 2 Score 2 2 1   Altered sleeping 1 1 0  Tired, decreased energy 1 1 1   Change in appetite 1 1 1   Feeling bad or failure about yourself  1 1 0  Trouble concentrating 1 1 0  Moving slowly or fidgety/restless 1 1 0  Suicidal thoughts 0 0 0  PHQ-9 Score 8 8 3   Difficult doing work/chores Somewhat difficult  Not difficult at all    History [name] has a past medical history of Anxiety, CAD (coronary artery disease), Carotid artery plaque, Cataract, Depression, Diverticulitis, colon, Elevated d-dimer (01/08/2014), Essential hypertension, benign, GERD (gastroesophageal reflux disease), H/O hiatal hernia, HLD (hyperlipidemia), IDDM (insulin dependent diabetes mellitus), Migraine, MS (multiple sclerosis) (HCC), PAT (paroxysmal atrial tachycardia) (HCC), Prolapse of uterus, PVD (peripheral vascular disease) (HCC), and TIA (transient ischemic attack) (1980's).   She has a past surgical history that includes Appendectomy (~ 1970); Cholecystectomy (?1987); Breast biopsy (Right, 1980's); Colonoscopy (2002); Colonoscopy (01/10/2012); Coronary angioplasty with stent (~ 1997 X 2); Cardiac catheterization (01/07/2014); Abdominal hysterectomy (1986); Breast lumpectomy (Right, 1980's); Eye surgery (Bilateral, 2014); left heart catheterization with  coronary angiogram (N/A, 01/07/2014); LEFT HEART CATH AND CORONARY ANGIOGRAPHY (N/A, 03/08/2017); CORONARY PRESSURE/FFR STUDY (N/A, 03/08/2017); LEFT HEART CATH AND CORONARY ANGIOGRAPHY (N/A, 03/26/2022); CORONARY STENT INTERVENTION (N/A, 03/26/2022); LEFT HEART CATH AND CORONARY ANGIOGRAPHY (N/A, 10/05/2022); CORONARY BALLOON ANGIOPLASTY (N/A, 10/05/2022); LEFT HEART CATH AND CORONARY ANGIOGRAPHY (N/A, 02/14/2023); Coronary artery bypass graft (N/A, 02/19/2023); and TEE without cardioversion (N/A, 02/19/2023).   Her family history includes Cervical cancer in her daughter; Colon cancer in her paternal aunt; Crohn's disease in her cousin; Diabetes in her brother, daughter, mother, and sister; GER disease in her daughter; Heart attack (age of onset: 39) in her brother; Heart attack (age of onset: 40) in her mother; Heart attack (age of onset: 43) in her father; Heart disease in her brother; Hypertension in her mother.She reports that she has never smoked. She has never used smokeless tobacco. She reports that she does not drink alcohol and does not use drugs.    ROS Review of Systems  Objective:  BP (!) 172/81   Pulse 77   Temp (!) 96.9 F (36.1 C)   Wt 152 lb (68.9 kg)   SpO2 96%   BMI 24.53 kg/m   BP Readings from Last 3 Encounters:  08/27/23 (!) 172/81  07/18/23 134/67  06/19/23 134/72    Wt Readings from Last 3 Encounters:  08/27/23 152 lb (68.9 kg)  07/18/23 145 lb (65.8 kg)  06/19/23 141 lb (64 kg)     Physical Exam Cardiovascular:     Rate and Rhythm: Normal rate and regular rhythm.     Comments: Multiple varicosities noted at  both lower extremities at the malleoli both medial and laterally.  There is no sign of bleeding.  However there is a nearly completely healed area on each side of crusting where the bleeding occurred. Musculoskeletal:     Right lower leg: No edema.     Left lower leg: No edema.       Assessment & Plan:   [name] was seen today for joint  swelling.  Diagnoses and all orders for this visit:  Varicose veins of ankle Comments: With rupture and bleeding, now resolved Orders: -     CBC with Differential/Platelet  ASCVD (arteriosclerotic cardiovascular disease) -     CBC with Differential/Platelet       I am having [name]. [name] maintain her insulin degludec, aspirin, clopidogrel, nitroGLYCERIN, NovoLOG FlexPen, rOPINIRole, lisinopril, alendronate, diphenhydrAMINE, levocetirizine, metoprolol tartrate, furosemide, sertraline, traZODone, FreeStyle Libre 3 Sensor, rosuvastatin, potassium chloride, Blood Glucose Monitoring Suppl, and Semaglutide (2 MG/DOSE).  Allergies as of 08/27/2023       Reactions   Iohexol     Desc: pt had syncopal episode with nausea post IV CM late 1990's,  pt has had prednisone prep with heart caths x 2 without problem  kdean 04/16/07, Onset Date: [id number]   Ticlid [ticlopidine Hcl] Nausea And Vomiting   Jardiance [empagliflozin] Other (See Comments)   Recurrent UTIs   Metformin And Related Diarrhea   Codeine Nausea And Vomiting, Palpitations        Medication List        Accurate as of August 27, 2023  6:09 PM. If you have any questions, ask your nurse or doctor.          alendronate 70 MG tablet Commonly known as: FOSAMAX Take 1 tablet (70 mg total) by mouth every 7 (seven) days. Take with a full glass of water on an empty stomach.   aspirin 81 MG chewable tablet Chew 1 tablet (81 mg total) by mouth daily.   Blood Glucose Monitoring Suppl Devi 1 each by Does not apply route in the morning, at noon, and at bedtime. May substitute to any manufacturer covered by patient's insurance.   clopidogrel 75 MG tablet Commonly known as: PLAVIX Take 1 tablet (75 mg total) by mouth daily with breakfast.   diphenhydrAMINE 25 mg capsule Commonly known as: BENADRYL Take 25 mg by mouth every 6 (six) hours as needed for allergies.   FreeStyle Libre 3 Sensor Misc PLACE 1 SENSOR ON THE SKIN  EVERY 14 DAYS   furosemide 20 MG tablet Commonly known as: LASIX Take 1 tablet (20 mg total) by mouth daily.   insulin degludec 200 UNIT/ML FlexTouch Pen Commonly known as: TRESIBA Inject 30-60 Units into the skin daily. Pt states she takes 60 units if she eats and 30 units if she does not eat   levocetirizine 5 MG tablet Commonly known as: XYZAL Take 0.5 tablets (2.5 mg total) by mouth every evening.   lisinopril 10 MG tablet Commonly known as: ZESTRIL Take 1 tablet (10 mg total) by mouth daily.   metoprolol tartrate 25 MG tablet Commonly known as: LOPRESSOR Take 0.5 tablets (12.5 mg total) by mouth 2 (two) times daily.   nitroGLYCERIN 0.4 MG SL tablet Commonly known as: NITROSTAT DISSOLVE 1 TABLET UNDER TONGUE FOR CHESTPAIN.MAY REPEAT EVERY 5 MINUTES FOR 3 DOSES.IF NO RELIEF CALL 911 OR GO TO ER   NovoLOG FlexPen 100 UNIT/ML FlexPen Generic drug: insulin aspart Inject 36 Units into the skin 3 (three) times daily with meals. What  changed: how much to take   potassium chloride 10 MEQ tablet Commonly known as: KLOR-CON TAKE ONE (1) TABLET BY MOUTH TWO (2) TIMES DAILY   rOPINIRole 0.5 MG tablet Commonly known as: Requip Take 2 tablets (1 mg total) by mouth daily as needed (restless leg).   rosuvastatin 20 MG tablet Commonly known as: CRESTOR TAKE ONE (1) TABLET BY MOUTH EVERY DAY   Semaglutide (2 MG/DOSE) 8 MG/3ML Sopn Inject 2 mg as directed once a week.   sertraline 50 MG tablet Commonly known as: ZOLOFT Take 1 tablet (50 mg total) by mouth daily.   traZODone 50 MG tablet Commonly known as: DESYREL Take 1 tablet (50 mg total) by mouth at bedtime as needed for sleep.         Follow-up: Return if symptoms worsen or fail to improve.  Mechele Claude, M.D.

## 2023-08-28 LAB — CBC WITH DIFFERENTIAL/PLATELET
Basophils Absolute: 0 10*3/uL (ref 0.0–0.2)
Basos: 0 %
EOS (ABSOLUTE): 0.1 10*3/uL (ref 0.0–0.4)
Eos: 2 %
Hematocrit: 36.1 % (ref 34.0–46.6)
Hemoglobin: 11.6 g/dL (ref 11.1–15.9)
Immature Grans (Abs): 0 10*3/uL (ref 0.0–0.1)
Immature Granulocytes: 0 %
Lymphocytes Absolute: 2.1 10*3/uL (ref 0.7–3.1)
Lymphs: 34 %
MCH: 30.1 pg (ref 26.6–33.0)
MCHC: 32.1 g/dL (ref 31.5–35.7)
MCV: 94 fL (ref 79–97)
Monocytes Absolute: 0.3 10*3/uL (ref 0.1–0.9)
Monocytes: 5 %
Neutrophils Absolute: 3.6 10*3/uL (ref 1.4–7.0)
Neutrophils: 59 %
Platelets: 160 10*3/uL (ref 150–450)
RBC: 3.85 x10E6/uL (ref 3.77–5.28)
RDW: 13 % (ref 11.7–15.4)
WBC: 6 10*3/uL (ref 3.4–10.8)

## 2023-08-28 NOTE — Progress Notes (Signed)
Hello Myrtis,  Your lab result is normal and/or stable.Some minor variations that are not significant are commonly marked abnormal, but do not represent any medical problem for you.  Best regards, Tyrick Dunagan, M.D.

## 2023-09-02 ENCOUNTER — Ambulatory Visit: Payer: PPO | Admitting: Family Medicine

## 2023-09-03 ENCOUNTER — Encounter: Payer: Self-pay | Admitting: Family Medicine

## 2023-09-20 NOTE — Progress Notes (Unsigned)
Pharmacy Medication Assistance Program Note    09/20/2023  Patient ID: [name], female   DOB: [birth year], 72 y.o.   MRN: [id number]     08/15/2023  Outreach Medication One  Manufacturer Medication One [employer] [facility] Ozempic  Dose of Ozempic 2mg   Type of Radiographer, therapeutic Assistance  Date Application Sent to Patient 08/15/2023  Application Items Requested Application  Date Application Sent to Prescriber 09/20/2023  Name of Prescriber Harlow Mares  Date Application Received From Patient 09/10/2023  Application Items Received From Patient Application         08/15/2023  Outreach Medication Two  Manufacturer Medication Two Novo Nordisk  [facility] Evaristo Bury  Dose of Tresiba U100  Type of Radiographer, therapeutic Assistance  Date Application Sent to Patient 08/15/2023  Application Items Requested Application  Date Application Sent to Prescriber 09/20/2023  Date Application Received From Patient 09/10/2023  Application Items Received From Patient Application        08/15/2023  Outreach Medication Three  Manufacturer Medication Three Novo Nordisk  [facility] Novolog  Type of Assistance Manufacturer Assistance  Date Application Sent to Patient 08/15/2023  Date Application Sent to Prescriber 09/20/2023  Date Application Received From Patient 09/10/2023  Application Items Received From Patient Application

## 2023-09-26 ENCOUNTER — Telehealth: Payer: Self-pay | Admitting: Family Medicine

## 2023-09-26 NOTE — Telephone Encounter (Signed)
 Pt calling in regards to patient assistance. Didn't see update from Nov note. Can you advise on this?   Copied from CRM [id number]. Topic: General - Call Back - No Documentation >> Sep 24, 2023 11:47 AM Laurier C wrote: Reason for CRM: Pt is asking for a call back from [employer]

## 2023-09-26 NOTE — Telephone Encounter (Signed)
 Pt says she can't take the 2mg  of Ozempic because it makes her sick. Needs dosage adjusted/lowered.

## 2023-09-27 ENCOUNTER — Telehealth: Payer: Self-pay | Admitting: Pharmacist

## 2023-09-27 NOTE — Telephone Encounter (Signed)
 Call placed to patient see pharmd note Will decrease to 1mg  Ozempic

## 2023-09-27 NOTE — Telephone Encounter (Signed)
 Spoke with patient re: Ozempic 2mg . She states Ozempic 2mg  was too strong.  We will decrease to 1mg  weekly.  Novo Nordisk PAP pages

## 2023-09-29 NOTE — Progress Notes (Deleted)
 Cardiology Office Note:   Date:  09/29/2023  ID:  [name], DOB October 04, 1950, MRN [id number] PCP: [name], FNP  [facility] Providers Cardiologist:  Lynwood Schilling, MD {  History of Present Illness:   [name] is a 73 y.o. female  who presents for follow up of CAD.  I have not seen her since 2018.   She has a known history of stents to the LAD and PTCA to diagonal in 2008.    She had non obstructive CAD in 2015.  She was admitted with slurred speech in 2016 and had a negative work up with an unremarkable echo and mild carotid plaque.  POET (Plain Old Exercise Treadmill) was intermediate risk.   I last saw her in 2018.  She had atypical chest pain at that time.  She had a follow-up perfusion study that suggest some possible mild ischemia in the basal inferolateral, mid inferolateral, apical inferior and apical lateral locations.   She had a cath at that time and had patent stents as below.    She was in the hospital in July 2023.  She had unstable angina with restenosis of the LAD proximal to the previously placed stent.  She had stenting of this.  She was in the hospital again in Jan 2024 .  She had unstable angina again and was in the hosital.  She had PCI of an LAD in stent restenosis.  She was in the hospital again in  02/12/2023 due to recurrent unstable angina, with new anterior infarct. Repeat cardiac catheterization revealed significant LAD disease and she had CABG x 1 utilizing LIMA to LAD on 02/19/2023.  She was seen in the emergency room on 05/09/2023 with hypokalemia, potassium of 3.3, fatigue, and palpitations.  She was given potassium replacement.  Kidney function had deteriorated and her Lasix  was stopped for 2 days and she was to follow-up with PCP.  On follow-up with PCP on 05/13/2023 they did not restart her Lasix .   ***   *** She presents for follow up.  She is having some dizziness when she first stands up but she otherwise is doing well.  She is not getting any  angina she was getting.  She denies any chest pressure, neck or arm discomfort.  She has had no new shortness of breath, PND or orthopnea.  She walks with her neighbor.  ROS: ***  Studies Reviewed:    EKG:       ***  Risk Assessment/Calculations:   {Does this patient have ATRIAL FIBRILLATION?:[phone]} No BP recorded.  {Refresh Note OR Click here to enter BP  :1}***        Physical Exam:   VS:  There were no vitals taken for this visit.   Wt Readings from Last 3 Encounters:  08/27/23 152 lb (68.9 kg)  07/18/23 145 lb (65.8 kg)  06/19/23 141 lb (64 kg)     GEN: Well nourished, well developed in no acute distress NECK: No JVD; No carotid bruits CARDIAC: ***RR, *** murmurs, rubs, gallops RESPIRATORY:  Clear to auscultation without rales, wheezing or rhonchi  ABDOMEN: Soft, non-tender, non-distended EXTREMITIES:  No edema; No deformity   ASSESSMENT AND PLAN:   CAD:   ***  The patient has no new sypmtoms.  No further cardiovascular testing is indicated.  We will continue with aggressive risk reduction and meds as listed.   HTN:  Her blood pressure is *** at target.  No change in therapy.  CAROTID STENOSIS:   ***    This has been mild.  No further imaging.   HYPERLIPIDEMIA:     Her LDL was *** at 65.  No change in therapy.    DM:  A1C was ***   not controlled and she just had her Ozempic  increased.  I will defer to her primary provider.    PROLONGED QT:    ***    QT is mildly prolonged and she avoids QT prolonging medications.      Follow up ***  Signed, Lynwood Schilling, MD

## 2023-10-01 ENCOUNTER — Other Ambulatory Visit: Payer: Self-pay | Admitting: Family Medicine

## 2023-10-01 DIAGNOSIS — R600 Localized edema: Secondary | ICD-10-CM

## 2023-10-01 DIAGNOSIS — E1169 Type 2 diabetes mellitus with other specified complication: Secondary | ICD-10-CM

## 2023-10-01 DIAGNOSIS — I509 Heart failure, unspecified: Secondary | ICD-10-CM

## 2023-10-01 NOTE — Progress Notes (Addendum)
 Pharmacy Medication Assistance Program Note    11/15/2023  Patient ID: [name], female   DOB: 04-03-1951, 73 y.o.   MRN: [id number]     08/15/2023  Outreach Medication One  Manufacturer Medication One Novo Nordisk  [facility] Ozempic   Dose of Ozempic  1MG   Type of Radiographer, Therapeutic Assistance  Date Application Sent to Patient 08/15/2023  Application Items Requested Application  Date Application Sent to Prescriber 09/20/2023  Name of Prescriber Annabella Search  Date Application Received From Patient 09/10/2023  Application Items Received From Patient Application  Date Application Submitted to Manufacturer 10/01/2023  Method Application Sent to Manufacturer Fax  Patient Assistance Determination Approved  Approval End Date 09/26/2024       11/15/2023  Patient ID: [name], female  DOB: [birth year], 73 y.o.  MRN:  [id number]     08/15/2023  Outreach Medication Two  Manufacturer Medication Two Novo Nordisk  [facility] Tresiba   Dose of Tresiba  U100  Type of Radiographer, Therapeutic Assistance  Date Application Sent to Patient 08/15/2023  Application Items Requested Application  Date Application Sent to Prescriber 09/20/2023  Date Application Received From Patient 09/10/2023  Application Items Received From Patient Application  Method Application Sent to Manufacturer Fax  Date Application Submitted to Manufacturer 10/01/2023  Patient Assistance Determination Approved       11/15/2023  Patient ID: [name], female  DOB: 1951/03/17, 73 y.o.  MRN:  [id number]     08/15/2023  Outreach Medication Three  Manufacturer Medication Three Novo Nordisk  [facility] Novolog   Type of Assistance Manufacturer Assistance  Date Application Sent to Patient 08/15/2023  Date Application Sent to Prescriber 09/20/2023  Date Application Received From Patient 09/10/2023  Application Items Received From Patient Application  Date Application Submitted to Manufacturer  10/01/2023  Method Application Sent to Manufacturer Fax  Patient Assistance Determination Approved  Approval End Date 09/26/2024

## 2023-10-02 ENCOUNTER — Other Ambulatory Visit: Payer: Self-pay | Admitting: Cardiology

## 2023-10-02 ENCOUNTER — Telehealth: Payer: Self-pay | Admitting: Family Medicine

## 2023-10-02 ENCOUNTER — Ambulatory Visit: Payer: PPO | Admitting: Cardiology

## 2023-10-02 ENCOUNTER — Other Ambulatory Visit: Payer: Self-pay | Admitting: Family Medicine

## 2023-10-02 ENCOUNTER — Telehealth: Payer: Self-pay | Admitting: Cardiology

## 2023-10-02 DIAGNOSIS — R600 Localized edema: Secondary | ICD-10-CM

## 2023-10-02 DIAGNOSIS — I509 Heart failure, unspecified: Secondary | ICD-10-CM

## 2023-10-02 DIAGNOSIS — Z951 Presence of aortocoronary bypass graft: Secondary | ICD-10-CM

## 2023-10-02 DIAGNOSIS — R9431 Abnormal electrocardiogram [ECG] [EKG]: Secondary | ICD-10-CM

## 2023-10-02 DIAGNOSIS — E785 Hyperlipidemia, unspecified: Secondary | ICD-10-CM

## 2023-10-02 DIAGNOSIS — E118 Type 2 diabetes mellitus with unspecified complications: Secondary | ICD-10-CM

## 2023-10-02 DIAGNOSIS — I10 Essential (primary) hypertension: Secondary | ICD-10-CM

## 2023-10-02 MED ORDER — CLOPIDOGREL BISULFATE 75 MG PO TABS: 75.0000 mg | ORAL_TABLET | Freq: Every day | ORAL | 3 refills | Status: AC

## 2023-10-02 MED ORDER — FUROSEMIDE 20 MG PO TABS
20.0000 mg | ORAL_TABLET | Freq: Every day | ORAL | 1 refills | Status: DC
Start: 2023-10-02 — End: 2024-05-06

## 2023-10-02 NOTE — Telephone Encounter (Signed)
 *  STAT* If patient is at the pharmacy, call can be transferred to refill team.   1. Which medications need to be refilled? (please list name of each medication and dose if known)   clopidogrel  (PLAVIX ) 75 MG tablet     2. Would you like to learn more about the convenience, safety, & potential cost savings by using the [facility]?    3. Are you open to using the [facility] (Type [facility].    4. Which pharmacy/location (including street and city if local pharmacy) is medication to be sent to?  [facility] [place], [place] - [address]     5. Do they need a 30 day or 90 day supply? 90 days   Pt said, she called pcp and was told to call Dr. Lavona to refill this medication. Also, she need this today so she can pick it up later before it start snowing again

## 2023-10-02 NOTE — Telephone Encounter (Signed)
 Copied from CRM [id number]. Topic: Clinical - Medication Refill >> Oct 02, 2023  9:07 AM Graeme ORN wrote: Most Recent Primary Care Visit:  Provider: ZOLLIE LOWERS  Department: [facility]  Visit Type: ACUTE  Date: 08/27/2023  Medication:  clopidogrel  (PLAVIX ) 75 MG tablet  furosemide  (LASIX ) 20 MG tablet  Has the patient contacted their pharmacy? Yes (Agent: If no, request that the patient contact the pharmacy for the refill. If patient does not wish to contact the pharmacy document the reason why and proceed with request.) (Agent: If yes, when and what did the pharmacy advise?)  Is this the correct pharmacy for this prescription? Yes If no, delete pharmacy and type the correct one.  This is the patient's preferred pharmacy:  [facility] [place] - [address] [place] [address] Phone: [phone] Fax: [phone]   Has the prescription been filled recently? No  Is the patient out of the medication? Yes  Has the patient been seen for an appointment in the last year OR does the patient have an upcoming appointment? Yes  Can we respond through MyChart? No  Agent: Please be advised that Rx refills may take up to 3 business days. We ask that you follow-up with your pharmacy.

## 2023-10-02 NOTE — Telephone Encounter (Signed)
 Pt advised Furosemide was sent over for 1 year worth 03/2023 and cardiology does her Plavix so she will contact them.

## 2023-10-02 NOTE — Telephone Encounter (Signed)
 Pt calling requesting a refill on clopidogrel. [name] has been refilling this medication. Dr. Antoine Poche does not follow pt for this medication. Would Dr. Antoine Poche like to refill this medication? Please address

## 2023-10-02 NOTE — Telephone Encounter (Signed)
 Pt says that [facility] [place] does not have her furosemide (LASIX) 20 MG tablet  on file.

## 2023-10-02 NOTE — Telephone Encounter (Signed)
 I spoke with pt earlier and advised pt we don't write rx for her plavix and to call her specialist and pt voiced understanding.

## 2023-10-02 NOTE — Addendum Note (Signed)
 Addended by: Julious Payer D on: 10/02/2023 01:41 PM   Modules accepted: Orders

## 2023-10-02 NOTE — Telephone Encounter (Signed)
 Talked with Judie Grieve at [facility], he does not have the script for the Furosemide 20 mg that was sent in in July, will send in remaining refills.

## 2023-10-09 ENCOUNTER — Encounter: Payer: Self-pay | Admitting: Family Medicine

## 2023-10-09 ENCOUNTER — Ambulatory Visit: Payer: PPO | Admitting: Family Medicine

## 2023-10-09 VITALS — BP 188/82 | HR 71 | Temp 98.9°F | Ht 66.0 in | Wt 164.2 lb

## 2023-10-09 DIAGNOSIS — F339 Major depressive disorder, recurrent, unspecified: Secondary | ICD-10-CM | POA: Diagnosis not present

## 2023-10-09 DIAGNOSIS — I25110 Atherosclerotic heart disease of native coronary artery with unstable angina pectoris: Secondary | ICD-10-CM | POA: Diagnosis not present

## 2023-10-09 DIAGNOSIS — I152 Hypertension secondary to endocrine disorders: Secondary | ICD-10-CM

## 2023-10-09 DIAGNOSIS — E1165 Type 2 diabetes mellitus with hyperglycemia: Secondary | ICD-10-CM | POA: Diagnosis not present

## 2023-10-09 DIAGNOSIS — N1831 Chronic kidney disease, stage 3a: Secondary | ICD-10-CM

## 2023-10-09 DIAGNOSIS — I509 Heart failure, unspecified: Secondary | ICD-10-CM | POA: Diagnosis not present

## 2023-10-09 DIAGNOSIS — Z794 Long term (current) use of insulin: Secondary | ICD-10-CM | POA: Diagnosis not present

## 2023-10-09 DIAGNOSIS — I130 Hypertensive heart and chronic kidney disease with heart failure and stage 1 through stage 4 chronic kidney disease, or unspecified chronic kidney disease: Secondary | ICD-10-CM

## 2023-10-09 DIAGNOSIS — R296 Repeated falls: Secondary | ICD-10-CM | POA: Diagnosis not present

## 2023-10-09 DIAGNOSIS — E785 Hyperlipidemia, unspecified: Secondary | ICD-10-CM

## 2023-10-09 DIAGNOSIS — N1832 Chronic kidney disease, stage 3b: Secondary | ICD-10-CM | POA: Insufficient documentation

## 2023-10-09 DIAGNOSIS — E1159 Type 2 diabetes mellitus with other circulatory complications: Secondary | ICD-10-CM

## 2023-10-09 DIAGNOSIS — Z8673 Personal history of transient ischemic attack (TIA), and cerebral infarction without residual deficits: Secondary | ICD-10-CM

## 2023-10-09 DIAGNOSIS — E1169 Type 2 diabetes mellitus with other specified complication: Secondary | ICD-10-CM | POA: Diagnosis not present

## 2023-10-09 DIAGNOSIS — G4701 Insomnia due to medical condition: Secondary | ICD-10-CM

## 2023-10-09 DIAGNOSIS — E1122 Type 2 diabetes mellitus with diabetic chronic kidney disease: Secondary | ICD-10-CM | POA: Diagnosis not present

## 2023-10-09 DIAGNOSIS — Z951 Presence of aortocoronary bypass graft: Secondary | ICD-10-CM

## 2023-10-09 DIAGNOSIS — F411 Generalized anxiety disorder: Secondary | ICD-10-CM

## 2023-10-09 LAB — BAYER DCA HB A1C WAIVED: HB A1C (BAYER DCA - WAIVED): 7.4 % — ABNORMAL HIGH (ref 4.8–5.6)

## 2023-10-09 NOTE — Assessment & Plan Note (Signed)
 Euvolemic today. On lasix .

## 2023-10-09 NOTE — Assessment & Plan Note (Signed)
 A1c 7.4 today. Not at goal of <7. Awaiting ozempic  from patient assistance. Diet, exercise. Continue novolog , tesiba. On ACE and statin.

## 2023-10-09 NOTE — Assessment & Plan Note (Signed)
On ACE 

## 2023-10-09 NOTE — Assessment & Plan Note (Signed)
 Well controlled with zoloft

## 2023-10-09 NOTE — Assessment & Plan Note (Signed)
 On statin.

## 2023-10-09 NOTE — Assessment & Plan Note (Signed)
 BP not at goal today. She denies symptoms. Did not take medication prior to appt today. Instructed to take medication and monitor BP. Notify for elevated readings, symptoms.

## 2023-10-09 NOTE — Assessment & Plan Note (Signed)
 On statin. Last LDL 56.

## 2023-10-09 NOTE — Assessment & Plan Note (Signed)
On plavix

## 2023-10-09 NOTE — Assessment & Plan Note (Signed)
 Declined referral to PT today.

## 2023-10-09 NOTE — Assessment & Plan Note (Signed)
 Managed by cardiology. On metoprolol , lisinopril , lasix , statin.

## 2023-10-09 NOTE — Progress Notes (Addendum)
Established Patient Office Visit  Subjective:  Patient ID: [name], female    DOB: 03-21-1951  Age: 73 y.o. MRN: [id number]  CC:  Chief Complaint  Patient presents with   Medical Management of Chronic Issues    HPI [name] presents for chronic follow up.   1. DM Patient denies foot ulcerations, paresthesia of the feet, vomiting and weight loss.   Current diabetic medications include tresiba 60 units daily, novolog 36 units with meals (only eats 2 small meals a day), decreased ozempic to 1 mg due to side effects about 2 weeks ago but she has been out this due to supply issues with patient assistance. Compliant with meds - yes    Current monitoring regimen: home blood tests - CGM Home blood sugar records: above target daily, worse since she has been out of ozempic Any episodes of hypoglycemia? 1 in last week.    Eye exam current (within one year): yes Weight trend: stable   Is She on ACE inhibitor or angiotensin II receptor blocker?  yes Is She on statin? Yes crestor   2. HTN/Cardiac Complaint with meds - Yes but didn't take her medication today.  Current Medications - lisinopril, metoprolol, lasix Checking BP at home: yes, reports 120s/80s Pertinent ROS:  Fatigue - improved Visual Disturbances - No Chest pain - No Dyspnea - No Palpitations - No LE edema - in ankles. Improving with elevation. Unchanged  S/P CABG. On plavix, statin.   3. HLD On statin. Regular diet, no exercise.   4. Fall 2 weeks ago she had a fal. She stood and felt like her legs just gave out when she tried to walk. Had recent head CT without acute findings. Still hasn't been staying well hydrated. Hasn't happened since. No longer having vertigo symptoms.   5. Depression, anxiety Doing well right now. Has new great grandbaby coming any day and is she is excited about this. Compliant with zoloft and trazodone.      10/09/2023   10:26 AM 08/27/2023    4:13 PM 08/27/2023    4:11 PM   Depression screen PHQ 2/9  Decreased Interest 1 1 1   Down, Depressed, Hopeless 0 1 1  PHQ - 2 Score 1 2 2   Altered sleeping 1 1 1   Tired, decreased energy 1 1 1   Change in appetite 1 1 1   Feeling bad or failure about yourself  0 1 1  Trouble concentrating 0 1 1  Moving slowly or fidgety/restless 0 1 1  Suicidal thoughts 0 0 0  PHQ-9 Score 4 8 8   Difficult doing work/chores Somewhat difficult Somewhat difficult       10/09/2023   10:27 AM 08/27/2023    4:12 PM 07/18/2023    9:06 AM 06/19/2023    8:11 AM  GAD 7 : Generalized Anxiety Score  Nervous, Anxious, on Edge 0 1 0 0  Control/stop worrying 0 1 0 0  Worry too much - different things 0 1 0 0  Trouble relaxing 0 1 0 0  Restless 0 0 0 0  Easily annoyed or irritable 0 0 0 0  Afraid - awful might happen 0 0 0 0  Total GAD 7 Score 0 4 0 0  Anxiety Difficulty Not difficult at all Somewhat difficult Not difficult at all Not difficult at all     Past Medical History:  Diagnosis Date   Anxiety    CAD (coronary artery disease)    DES to circumflex 02/2007,  BMS to LAD and PTCA diagonal 03/2007   Carotid artery plaque    Mild   Cataract    Depression    Diverticulitis, colon    Elevated d-dimer 01/08/2014   Essential hypertension, benign    GERD (gastroesophageal reflux disease)    H/O hiatal hernia    HLD (hyperlipidemia)    IDDM (insulin dependent diabetes mellitus)    Migraine    "used to have them really bad; don't have them anymore" (01/07/2014)   MS (multiple sclerosis) (HCC)    Not confirmed   PAT (paroxysmal atrial tachycardia) (HCC)    Prolapse of uterus    PVD (peripheral vascular disease) (HCC)    TIA (transient ischemic attack) 1980's    Past Surgical History:  Procedure Laterality Date   ABDOMINAL HYSTERECTOMY  1986   ovaries remain - prolaspe uterus    APPENDECTOMY  ~ 1970   BREAST BIOPSY Right 1980's   BREAST LUMPECTOMY Right 1980's   Dr. Luan Moore    CARDIAC CATHETERIZATION  01/07/2014    CHOLECYSTECTOMY  ?1987   COLONOSCOPY  2002   Dr. Anwar--> Severe diverticular changes in the region of the sigmoid and descending colon with scattered diverticular changes throughout the rest of the colon. No polyps, ulcerations. Despite numerous manipulations, the tip of the scope could not be tipped into the cecal area.   COLONOSCOPY  01/10/2012   Procedure: COLONOSCOPY;  Surgeon: Corbin Ade, MD;  Location: [facility];  Service: Endoscopy;  Laterality: N/A;  1:55   CORONARY ANGIOPLASTY WITH STENT PLACEMENT  ~ 1997 X 2   "2 + 1"   CORONARY ARTERY BYPASS GRAFT N/A 02/19/2023   Procedure: OFF PUMP CORONARY ARTERY BYPASS GRAFTING (CABG) X 1;  Surgeon: Corliss Skains, MD;  Location: [facility] OR;  Service: Open Heart Surgery;  Laterality: N/A;  LIMA TO LAD   CORONARY BALLOON ANGIOPLASTY N/A 10/05/2022   Procedure: CORONARY BALLOON ANGIOPLASTY;  Surgeon: Tonny Bollman, MD;  Location: [facility] INVASIVE CV LAB;  Service: Cardiovascular;  Laterality: N/A;   CORONARY PRESSURE/FFR STUDY N/A 03/08/2017   Procedure: Intravascular Pressure Wire/FFR Study;  Surgeon: Yvonne Kendall, MD;  Location: [facility] INVASIVE CV LAB;  Service: Cardiovascular;  Laterality: N/A;   CORONARY STENT INTERVENTION N/A 03/26/2022   Procedure: CORONARY STENT INTERVENTION;  Surgeon: Runell Gess, MD;  Location: [facility] INVASIVE CV LAB;  Service: Cardiovascular;  Laterality: N/A;   EYE SURGERY Bilateral 2014   cataract   LEFT HEART CATH AND CORONARY ANGIOGRAPHY N/A 03/08/2017   Procedure: Left Heart Cath and Coronary Angiography;  Surgeon: Yvonne Kendall, MD;  Location: [facility] INVASIVE CV LAB;  Service: Cardiovascular;  Laterality: N/A;   LEFT HEART CATH AND CORONARY ANGIOGRAPHY N/A 03/26/2022   Procedure: LEFT HEART CATH AND CORONARY ANGIOGRAPHY;  Surgeon: Runell Gess, MD;  Location: [facility] INVASIVE CV LAB;  Service: Cardiovascular;  Laterality: N/A;   LEFT HEART CATH AND CORONARY ANGIOGRAPHY N/A 10/05/2022   Procedure: LEFT HEART CATH AND  CORONARY ANGIOGRAPHY;  Surgeon: Tonny Bollman, MD;  Location: [facility] INVASIVE CV LAB;  Service: Cardiovascular;  Laterality: N/A;   LEFT HEART CATH AND CORONARY ANGIOGRAPHY N/A 02/14/2023   Procedure: LEFT HEART CATH AND CORONARY ANGIOGRAPHY;  Surgeon: Swazil[place], Peter M, MD;  Location: [facility] INVASIVE CV LAB;  Service: Cardiovascular;  Laterality: N/A;   LEFT HEART CATHETERIZATION WITH CORONARY ANGIOGRAM N/A 01/07/2014   Procedure: LEFT HEART CATHETERIZATION WITH CORONARY ANGIOGRAM;  Surgeon: Laurey Morale, MD;  Location: [facility] CATH LAB;  Service:  Cardiovascular;  Laterality: N/A;   TEE WITHOUT CARDIOVERSION N/A 02/19/2023   Procedure: TRANSESOPHAGEAL ECHOCARDIOGRAM;  Surgeon: Corliss Skains, MD;  Location: [facility] OR;  Service: Open Heart Surgery;  Laterality: N/A;    Family History  Problem Relation Age of Onset   Heart attack Mother 86   Diabetes Mother    Hypertension Mother    Heart attack Father 3   Heart attack Brother 32       x 6   Heart disease Brother    Diabetes Brother    Colon cancer Paternal Aunt        54s, died with brain anuerysm   Crohn's disease Cousin        paternal   Diabetes Sister    GER disease Daughter    Cervical cancer Daughter    Diabetes Daughter     Social History   Socioeconomic History   Marital status: Widowed    Spouse name: Not on file   Number of children: 4   Years of education: 60   Highest education level: 11th grade  Occupational History   Occupation: Disability    Employer: DISABLED  Tobacco Use   Smoking status: Never   Smokeless tobacco: Never   Tobacco comments:    spouse, 41 years - husband has quit 01/2011  Vaping Use   Vaping status: Never Used  Substance and Sexual Activity   Alcohol use: No   Drug use: No   Sexual activity: Not Currently  Other Topics Concern   Not on file  Social History Narrative   Lives alone, one level, handicap accessible bathroom, her children all live nearby   Social Drivers of Health    Financial Resource Strain: Low Risk  (01/24/2023)   Overall Financial Resource Strain (CARDIA)    Difficulty of Paying Living Expenses: Not hard at all  Food Insecurity: No Food Insecurity (02/14/2023)   Hunger Vital Sign    Worried About Running Out of Food in the Last Year: Never true    Ran Out of Food in the Last Year: Never true  Transportation Needs: No Transportation Needs (02/14/2023)   PRAPARE - Administrator, Civil Service (Medical): No    Lack of Transportation (Non-Medical): No  Physical Activity: Insufficiently Active (01/24/2023)   Exercise Vital Sign    Days of Exercise per Week: 3 days    Minutes of Exercise per Session: 30 min  Stress: No Stress Concern Present (01/24/2023)   [employer] of Occupational Health - Occupational Stress Questionnaire    Feeling of Stress : Not at all  Social Connections: Moderately Integrated (01/24/2023)   Social Connection and Isolation Panel [NHANES]    Frequency of Communication with Friends and Family: More than three times a week    Frequency of Social Gatherings with Friends and Family: More than three times a week    Attends Religious Services: More than 4 times per year    Active Member of [employer] or Organizations: No    Attends Banker Meetings: Never    Marital Status: Married  Catering manager Violence: Not At Risk (02/14/2023)   Humiliation, Afraid, Rape, and Kick questionnaire    Fear of Current or Ex-Partner: No    Emotionally Abused: No    Physically Abused: No    Sexually Abused: No    Outpatient Medications Prior to Visit  Medication Sig Dispense Refill   alendronate (FOSAMAX) 70 MG tablet Take 1 tablet (70 mg total) by mouth  every 7 (seven) days. Take with a full glass of water on an empty stomach. 12 tablet 3   aspirin 81 MG chewable tablet Chew 1 tablet (81 mg total) by mouth daily. 90 tablet 3   Blood Glucose Monitoring Suppl DEVI 1 each by Does not apply route in the morning, at noon, and  at bedtime. May substitute to any manufacturer covered by patient's insurance. 1 each 0   clopidogrel (PLAVIX) 75 MG tablet Take 1 tablet (75 mg total) by mouth daily with breakfast. 90 tablet 3   Continuous Glucose Sensor (FREESTYLE LIBRE 3 SENSOR) MISC PLACE 1 SENSOR ON THE SKIN EVERY 14 DAYS 6 each 3   diphenhydrAMINE (BENADRYL) 25 mg capsule Take 25 mg by mouth every 6 (six) hours as needed for allergies.     furosemide (LASIX) 20 MG tablet Take 1 tablet (20 mg total) by mouth daily. 90 tablet 1   insulin aspart (NOVOLOG FLEXPEN) 100 UNIT/ML FlexPen Inject 36 Units into the skin 3 (three) times daily with meals. (Patient taking differently: Inject 25-36 Units into the skin 3 (three) times daily with meals.)     insulin degludec (TRESIBA) 200 UNIT/ML FlexTouch Pen Inject 30-60 Units into the skin daily. Pt states she takes 60 units if she eats and 30 units if she does not eat     levocetirizine (XYZAL) 5 MG tablet Take 0.5 tablets (2.5 mg total) by mouth every evening. 90 tablet 3   lisinopril (ZESTRIL) 10 MG tablet Take 1 tablet (10 mg total) by mouth daily. 90 tablet 3   metoprolol tartrate (LOPRESSOR) 25 MG tablet Take 0.5 tablets (12.5 mg total) by mouth 2 (two) times daily. 60 tablet 3   nitroGLYCERIN (NITROSTAT) 0.4 MG SL tablet DISSOLVE 1 TABLET UNDER TONGUE FOR CHESTPAIN.MAY REPEAT EVERY 5 MINUTES FOR 3 DOSES.IF NO RELIEF CALL 911 OR GO TO ER 25 tablet 3   potassium chloride (KLOR-CON) 10 MEQ tablet TAKE ONE (1) TABLET BY MOUTH TWO (2) TIMES DAILY 180 tablet 0   rOPINIRole (REQUIP) 0.5 MG tablet Take 2 tablets (1 mg total) by mouth daily as needed (restless leg).     rosuvastatin (CRESTOR) 20 MG tablet TAKE ONE (1) TABLET BY MOUTH EVERY DAY 90 tablet 0   Semaglutide, 2 MG/DOSE, 8 MG/3ML SOPN Inject 2 mg as directed once a week. (Patient taking differently: Inject 1 mg as directed once a week.)     sertraline (ZOLOFT) 50 MG tablet Take 1 tablet (50 mg total) by mouth daily. 90 tablet 3    traZODone (DESYREL) 50 MG tablet Take 1 tablet (50 mg total) by mouth at bedtime as needed for sleep. 90 tablet 3   No facility-administered medications prior to visit.    Allergies  Allergen Reactions   Iohexol      Desc: pt had syncopal episode with nausea post IV CM late 1990's,  pt has had prednisone prep with heart caths x 2 without problem  kdean 04/16/07, Onset Date: 13244010    Ticlid [Ticlopidine Hcl] Nausea And Vomiting   Jardiance [Empagliflozin] Other (See Comments)    Recurrent UTIs   Metformin And Related Diarrhea   Codeine Nausea And Vomiting and Palpitations    ROS Review of Systems As per HPI.   Objective:    Physical Exam Vitals and nursing note reviewed.  Constitutional:      General: She is not in acute distress.    Appearance: Normal appearance. She is not ill-appearing, toxic-appearing or diaphoretic.  HENT:  Head: Normocephalic and atraumatic.  Eyes:     Extraocular Movements: Extraocular movements intact.     Pupils: Pupils are equal, round, and reactive to light.  Neck:     Thyroid: No thyroid mass, thyromegaly or thyroid tenderness.  Cardiovascular:     Rate and Rhythm: Normal rate and regular rhythm.     Heart sounds: Normal heart sounds. No murmur heard. Pulmonary:     Effort: Pulmonary effort is normal. No respiratory distress.     Breath sounds: Normal breath sounds.  Abdominal:     General: Bowel sounds are normal. There is no distension.     Palpations: Abdomen is soft.     Tenderness: There is no abdominal tenderness. There is no right CVA tenderness, left CVA tenderness, guarding or rebound.  Musculoskeletal:     Cervical back: Neck supple. No rigidity.     Right lower leg: No edema.     Left lower leg: No edema.  Skin:    General: Skin is warm and dry.  Neurological:     General: No focal deficit present.     Mental Status: She is alert and oriented to person, place, and time.     Cranial Nerves: No cranial nerve deficit.      Sensory: No sensory deficit.     Motor: No weakness.     Coordination: Coordination normal.     Gait: Gait normal.  Psychiatric:        Mood and Affect: Mood normal.        Behavior: Behavior normal.        Thought Content: Thought content normal.        Judgment: Judgment normal.    BP (!) 207/86   Pulse 71   Temp 98.9 F (37.2 C) (Temporal)   Ht 5\' 6"  (1.676 m)   Wt 164 lb 3.2 oz (74.5 kg)   SpO2 99%   BMI 26.50 kg/m  Wt Readings from Last 3 Encounters:  10/09/23 164 lb 3.2 oz (74.5 kg)  08/27/23 152 lb (68.9 kg)  07/18/23 145 lb (65.8 kg)    Health Maintenance Due  Topic Date Due   OPHTHALMOLOGY EXAM  04/25/2023   MAMMOGRAM  08/28/2023   Diabetic kidney evaluation - Urine ACR  09/21/2023    There are no preventive care reminders to display for this patient.  Assessment & Plan:   Uncontrolled type 2 diabetes mellitus with hyperglycemia, with long-term current use of insulin (HCC) Assessment & Plan: A1c 7.4 today. Not at goal of <7. Awaiting ozempic from patient assistance. Diet, exercise. Continue novolog, tesiba. On ACE and statin.   Orders: -     Bayer DCA Hb A1c Waived -     Bayer DCA Hb A1c Waived  Hypertension associated with diabetes (HCC) Assessment & Plan: BP not at goal today. She denies symptoms. Did not take medication prior to appt today. Instructed to take medication and monitor BP. Notify for elevated readings, symptoms.   Orders: -     CBC with Differential/Platelet  Hyperlipidemia associated with type 2 diabetes mellitus (HCC) Assessment & Plan: On statin. Last LDL 56.   Stage 3b chronic kidney disease (HCC) -     CMP14+EGFR -     Microalbumin / creatinine urine ratio  Coronary artery disease involving native coronary artery of native heart with unstable angina pectoris [facility]) Assessment & Plan: Managed by cardiology. On metoprolol, lisinopril, lasix, statin.    S/P CABG x 1 Assessment & Plan: On plavix.  Congestive heart  failure, unspecified HF chronicity, unspecified heart failure type [facility]) Assessment & Plan: Euvolemic today. On lasix.    History of stroke Assessment & Plan: On statin.    Insomnia due to medical condition  Generalized anxiety disorder Assessment & Plan: Well controlled with zoloft.    Depression, recurrent (HCC) Assessment & Plan: Well controlled with zoloft.    Recurrent falls Assessment & Plan: Declined referral to PT today.     Return in about 3 months (around 01/07/2024), or if symptoms worsen or fail to improve, for chronic follow up.   The patient indicates understanding of these issues and agrees with the plan.   Gabriel Earing, FNP

## 2023-10-10 LAB — CMP14+EGFR
ALT: 11 [IU]/L (ref 0–32)
AST: 17 IU/L (ref 0–40)
Albumin: 3.6 g/dL — ABNORMAL LOW (ref 3.8–4.8)
Alkaline Phosphatase: 74 [IU]/L (ref 44–121)
BUN/Creatinine Ratio: 9 — ABNORMAL LOW (ref 12–28)
BUN: 10 mg/dL (ref 8–27)
Bilirubin Total: 0.3 mg/dL (ref 0.0–1.2)
CO2: 24 mmol/L (ref 20–29)
Calcium: 9 mg/dL (ref 8.7–10.3)
Chloride: 102 mmol/L (ref 96–106)
Creatinine, Ser: 1.06 mg/dL — ABNORMAL HIGH (ref 0.57–1.00)
Globulin, Total: 2.4 g/dL (ref 1.5–4.5)
Glucose: 378 mg/dL — ABNORMAL HIGH (ref 70–99)
Potassium: 4.6 mmol/L (ref 3.5–5.2)
Sodium: 139 mmol/L (ref 134–144)
Total Protein: 6 g/dL (ref 6.0–8.5)
eGFR: 55 mL/min/{1.73_m2} — ABNORMAL LOW (ref >59)

## 2023-10-10 LAB — CBC WITH DIFFERENTIAL/PLATELET
Basophils Absolute: 0 10*3/uL (ref 0.0–0.2)
Basos: 0 %
EOS (ABSOLUTE): 0.1 10*3/uL (ref 0.0–0.4)
Eos: 2 %
Hematocrit: 35.8 % (ref 34.0–46.6)
Hemoglobin: 11.2 g/dL (ref 11.1–15.9)
Immature Grans (Abs): 0 10*3/uL (ref 0.0–0.1)
Immature Granulocytes: 0 %
Lymphocytes Absolute: 1.7 10*3/uL (ref 0.7–3.1)
Lymphs: 27 %
MCH: 29.9 pg (ref 26.6–33.0)
MCHC: 31.3 g/dL — ABNORMAL LOW (ref 31.5–35.7)
MCV: 96 fL (ref 79–97)
Monocytes Absolute: 0.4 10*3/uL (ref 0.1–0.9)
Monocytes: 6 %
Neutrophils Absolute: 4 10*3/uL (ref 1.4–7.0)
Neutrophils: 65 %
Platelets: 153 10*3/uL (ref 150–450)
RBC: 3.75 x10E6/uL — ABNORMAL LOW (ref 3.77–5.28)
RDW: 12.9 % (ref 11.7–15.4)
WBC: 6.2 10*3/uL (ref 3.4–10.8)

## 2023-10-11 ENCOUNTER — Telehealth: Payer: Self-pay

## 2023-10-11 ENCOUNTER — Encounter: Payer: Self-pay | Admitting: Family Medicine

## 2023-10-11 LAB — MICROALBUMIN / CREATININE URINE RATIO
Creatinine, Urine: 34.9 mg/dL
Microalb/Creat Ratio: 26 mg/g{creat} (ref 0–29)
Microalbumin, Urine: 9.1 ug/mL

## 2023-10-11 NOTE — Telephone Encounter (Signed)
See result note.  

## 2023-10-11 NOTE — Telephone Encounter (Signed)
Copied from CRM [id number]. Topic: Clinical - Lab/Test Results >> Oct 11, 2023  9:51 AM Carlatta H wrote: Reason for CRM: Please call patient about lab results

## 2023-10-11 NOTE — Telephone Encounter (Signed)
Please review labs. 

## 2023-11-20 ENCOUNTER — Telehealth: Payer: Self-pay

## 2023-11-20 NOTE — Telephone Encounter (Signed)
 Patients Tresiba, Ozempic, and NovoLog has arrived. Patient aware, verbalized understanding.

## 2023-11-28 ENCOUNTER — Encounter (HOSPITAL_COMMUNITY): Payer: Self-pay

## 2023-11-28 ENCOUNTER — Observation Stay (HOSPITAL_COMMUNITY)

## 2023-11-28 ENCOUNTER — Observation Stay (HOSPITAL_COMMUNITY)
Admission: EM | Admit: 2023-11-28 | Discharge: 2023-11-29 | Disposition: A | Discharge status: Home / Self Care | Attending: Internal Medicine | Admitting: Internal Medicine

## 2023-11-28 ENCOUNTER — Emergency Department (HOSPITAL_COMMUNITY)

## 2023-11-28 ENCOUNTER — Other Ambulatory Visit: Payer: Self-pay

## 2023-11-28 DIAGNOSIS — E1122 Type 2 diabetes mellitus with diabetic chronic kidney disease: Secondary | ICD-10-CM | POA: Diagnosis not present

## 2023-11-28 DIAGNOSIS — R509 Fever, unspecified: Secondary | ICD-10-CM | POA: Diagnosis not present

## 2023-11-28 DIAGNOSIS — R739 Hyperglycemia, unspecified: Secondary | ICD-10-CM | POA: Diagnosis not present

## 2023-11-28 DIAGNOSIS — E782 Mixed hyperlipidemia: Secondary | ICD-10-CM | POA: Diagnosis not present

## 2023-11-28 DIAGNOSIS — Z7902 Long term (current) use of antithrombotics/antiplatelets: Secondary | ICD-10-CM | POA: Diagnosis not present

## 2023-11-28 DIAGNOSIS — Z951 Presence of aortocoronary bypass graft: Secondary | ICD-10-CM | POA: Diagnosis not present

## 2023-11-28 DIAGNOSIS — R0689 Other abnormalities of breathing: Secondary | ICD-10-CM | POA: Diagnosis not present

## 2023-11-28 DIAGNOSIS — R112 Nausea with vomiting, unspecified: Secondary | ICD-10-CM | POA: Diagnosis present

## 2023-11-28 DIAGNOSIS — E1100 Type 2 diabetes mellitus with hyperosmolarity without nonketotic hyperglycemic-hyperosmolar coma (NKHHC): Secondary | ICD-10-CM | POA: Diagnosis present

## 2023-11-28 DIAGNOSIS — F411 Generalized anxiety disorder: Secondary | ICD-10-CM | POA: Diagnosis present

## 2023-11-28 DIAGNOSIS — I160 Hypertensive urgency: Principal | ICD-10-CM

## 2023-11-28 DIAGNOSIS — R29818 Other symptoms and signs involving the nervous system: Secondary | ICD-10-CM | POA: Diagnosis not present

## 2023-11-28 DIAGNOSIS — I2510 Atherosclerotic heart disease of native coronary artery without angina pectoris: Secondary | ICD-10-CM | POA: Diagnosis present

## 2023-11-28 DIAGNOSIS — Z7982 Long term (current) use of aspirin: Secondary | ICD-10-CM | POA: Insufficient documentation

## 2023-11-28 DIAGNOSIS — N1832 Chronic kidney disease, stage 3b: Secondary | ICD-10-CM | POA: Diagnosis not present

## 2023-11-28 DIAGNOSIS — R42 Dizziness and giddiness: Secondary | ICD-10-CM | POA: Diagnosis not present

## 2023-11-28 DIAGNOSIS — F32A Depression, unspecified: Secondary | ICD-10-CM | POA: Diagnosis not present

## 2023-11-28 DIAGNOSIS — I129 Hypertensive chronic kidney disease with stage 1 through stage 4 chronic kidney disease, or unspecified chronic kidney disease: Secondary | ICD-10-CM | POA: Insufficient documentation

## 2023-11-28 DIAGNOSIS — Z955 Presence of coronary angioplasty implant and graft: Secondary | ICD-10-CM | POA: Diagnosis not present

## 2023-11-28 DIAGNOSIS — Z79899 Other long term (current) drug therapy: Secondary | ICD-10-CM | POA: Diagnosis not present

## 2023-11-28 DIAGNOSIS — E1165 Type 2 diabetes mellitus with hyperglycemia: Secondary | ICD-10-CM | POA: Diagnosis not present

## 2023-11-28 DIAGNOSIS — N179 Acute kidney failure, unspecified: Secondary | ICD-10-CM | POA: Diagnosis not present

## 2023-11-28 DIAGNOSIS — Z7985 Long-term (current) use of injectable non-insulin antidiabetic drugs: Secondary | ICD-10-CM | POA: Insufficient documentation

## 2023-11-28 DIAGNOSIS — R519 Headache, unspecified: Secondary | ICD-10-CM | POA: Diagnosis not present

## 2023-11-28 DIAGNOSIS — Z1152 Encounter for screening for COVID-19: Secondary | ICD-10-CM | POA: Diagnosis not present

## 2023-11-28 DIAGNOSIS — R29810 Facial weakness: Secondary | ICD-10-CM | POA: Diagnosis not present

## 2023-11-28 DIAGNOSIS — G4489 Other headache syndrome: Secondary | ICD-10-CM | POA: Diagnosis not present

## 2023-11-28 DIAGNOSIS — I25110 Atherosclerotic heart disease of native coronary artery with unstable angina pectoris: Secondary | ICD-10-CM | POA: Diagnosis present

## 2023-11-28 DIAGNOSIS — R000 Tachycardia, unspecified: Secondary | ICD-10-CM | POA: Diagnosis not present

## 2023-11-28 DIAGNOSIS — Z794 Long term (current) use of insulin: Secondary | ICD-10-CM | POA: Diagnosis not present

## 2023-11-28 LAB — BASIC METABOLIC PANEL
Anion gap: 23 — ABNORMAL HIGH (ref 5–15)
Anion gap: 12 (ref 5–15)
Anion gap: 14 (ref 5–15)
BUN: 38 mg/dL — ABNORMAL HIGH (ref 8–23)
BUN: 37 mg/dL — ABNORMAL HIGH (ref 8–23)
BUN: 36 mg/dL — ABNORMAL HIGH (ref 8–23)
CO2: 24 mmol/L (ref 22–32)
CO2: 27 mmol/L (ref 22–32)
CO2: 24 mmol/L (ref 22–32)
Calcium: 9.6 mg/dL (ref 8.9–10.3)
Calcium: 8.5 mg/dL — ABNORMAL LOW (ref 8.9–10.3)
Calcium: 8.4 mg/dL — ABNORMAL LOW (ref 8.9–10.3)
Chloride: 88 mmol/L — ABNORMAL LOW (ref 98–111)
Chloride: 95 mmol/L — ABNORMAL LOW (ref 98–111)
Chloride: 96 mmol/L — ABNORMAL LOW (ref 98–111)
Creatinine, Ser: 1.76 mg/dL — ABNORMAL HIGH (ref 0.44–1.00)
Creatinine, Ser: 1.44 mg/dL — ABNORMAL HIGH (ref 0.44–1.00)
Creatinine, Ser: 1.35 mg/dL — ABNORMAL HIGH (ref 0.44–1.00)
GFR, Estimated: 30 mL/min — ABNORMAL LOW (ref >60)
GFR, Estimated: 38 mL/min — ABNORMAL LOW (ref >60)
GFR, Estimated: 41 mL/min — ABNORMAL LOW (ref >60)
Glucose, Bld: 460 mg/dL — ABNORMAL HIGH (ref 70–99)
Glucose, Bld: 225 mg/dL — ABNORMAL HIGH (ref 70–99)
Glucose, Bld: 275 mg/dL — ABNORMAL HIGH (ref 70–99)
Potassium: 3.5 mmol/L (ref 3.5–5.1)
Potassium: 2.9 mmol/L — ABNORMAL LOW (ref 3.5–5.1)
Potassium: 3.5 mmol/L (ref 3.5–5.1)
Sodium: 135 mmol/L (ref 135–145)
Sodium: 134 mmol/L — ABNORMAL LOW (ref 135–145)
Sodium: 134 mmol/L — ABNORMAL LOW (ref 135–145)

## 2023-11-28 LAB — CBC WITH DIFFERENTIAL/PLATELET
Abs Immature Granulocytes: 0.05 10*3/uL (ref 0.00–0.07)
Basophils Absolute: 0 10*3/uL (ref 0.0–0.1)
Basophils Relative: 0 %
Eosinophils Absolute: 0 10*3/uL (ref 0.0–0.5)
Eosinophils Relative: 0 %
HCT: 40.3 % (ref 36.0–46.0)
Hemoglobin: 13.4 g/dL (ref 12.0–15.0)
Immature Granulocytes: 1 %
Lymphocytes Relative: 5 %
Lymphs Abs: 0.5 10*3/uL — ABNORMAL LOW (ref 0.7–4.0)
MCH: 29.3 pg (ref 26.0–34.0)
MCHC: 33.3 g/dL (ref 30.0–36.0)
MCV: 88.2 fL (ref 80.0–100.0)
Monocytes Absolute: 0.7 10*3/uL (ref 0.1–1.0)
Monocytes Relative: 6 %
Neutro Abs: 9.3 10*3/uL — ABNORMAL HIGH (ref 1.7–7.7)
Neutrophils Relative %: 88 %
Platelets: 188 10*3/uL (ref 150–400)
RBC: 4.57 MIL/uL (ref 3.87–5.11)
RDW: 13.5 % (ref 11.5–15.5)
WBC: 10.6 10*3/uL — ABNORMAL HIGH (ref 4.0–10.5)
nRBC: 0 % (ref 0.0–0.2)

## 2023-11-28 LAB — CBG MONITORING, ED
Glucose-Capillary: 471 mg/dL — ABNORMAL HIGH (ref 70–99)
Glucose-Capillary: 546 mg/dL (ref 70–99)
Glucose-Capillary: 380 mg/dL — ABNORMAL HIGH (ref 70–99)
Glucose-Capillary: 482 mg/dL — ABNORMAL HIGH (ref 70–99)
Glucose-Capillary: 276 mg/dL — ABNORMAL HIGH (ref 70–99)
Glucose-Capillary: 202 mg/dL — ABNORMAL HIGH (ref 70–99)
Glucose-Capillary: 238 mg/dL — ABNORMAL HIGH (ref 70–99)

## 2023-11-28 LAB — URINALYSIS, W/ REFLEX TO CULTURE (INFECTION SUSPECTED)
Bacteria, UA: NONE SEEN
Bilirubin Urine: NEGATIVE
Glucose, UA: >=500 mg/dL — AB
Ketones, ur: 20 mg/dL — AB
Leukocytes,Ua: NEGATIVE
Nitrite: NEGATIVE
Protein, ur: 100 mg/dL — AB
Specific Gravity, Urine: 1.027 (ref 1.005–1.030)
pH: 5 (ref 5.0–8.0)

## 2023-11-28 LAB — BLOOD GAS, VENOUS
Acid-Base Excess: 4.5 mmol/L — ABNORMAL HIGH (ref 0.0–2.0)
Bicarbonate: 29.2 mmol/L — ABNORMAL HIGH (ref 20.0–28.0)
Drawn by: 7213
O2 Saturation: 79.4 %
Patient temperature: 37.4
pCO2, Ven: 44 mmHg (ref 44–60)
pH, Ven: 7.43 (ref 7.25–7.43)
pO2, Ven: 48 mmHg — ABNORMAL HIGH (ref 32–45)

## 2023-11-28 LAB — BETA-HYDROXYBUTYRIC ACID: Beta-Hydroxybutyric Acid: 5.83 mmol/L — ABNORMAL HIGH (ref 0.05–0.27)

## 2023-11-28 LAB — LACTIC ACID, PLASMA
Lactic Acid, Venous: 1.5 mmol/L (ref 0.5–1.9)
Lactic Acid, Venous: 1.6 mmol/L (ref 0.5–1.9)

## 2023-11-28 LAB — RESP PANEL BY RT-PCR (RSV, FLU A&B, COVID)  RVPGX2
Influenza A by PCR: NEGATIVE
Influenza B by PCR: NEGATIVE
Resp Syncytial Virus by PCR: NEGATIVE
SARS Coronavirus 2 by RT PCR: NEGATIVE

## 2023-11-28 LAB — PROCALCITONIN: Procalcitonin: 0.11 ng/mL

## 2023-11-28 LAB — OSMOLALITY: Osmolality: 331 mosm/kg (ref 275–295)

## 2023-11-28 LAB — HEMOGLOBIN A1C
Hgb A1c MFr Bld: 9.1 % — ABNORMAL HIGH (ref 4.8–5.6)
Mean Plasma Glucose: 214.47 mg/dL

## 2023-11-28 MED ORDER — DEXTROSE IN LACTATED RINGERS 5 % IV SOLN: INTRAVENOUS | Status: DC

## 2023-11-28 MED ORDER — ROSUVASTATIN CALCIUM 20 MG PO TABS
20.0000 mg | ORAL_TABLET | Freq: Every day | ORAL | Status: DC
  Administered 2023-11-29: 20 mg via ORAL
  Filled 2023-11-28: qty 1

## 2023-11-28 MED ORDER — SODIUM CHLORIDE 0.9 % IV BOLUS
1000.0000 mL | Freq: Once | INTRAVENOUS | Status: AC
  Administered 2023-11-28: 1000 mL via INTRAVENOUS

## 2023-11-28 MED ORDER — HYDRALAZINE HCL 20 MG/ML IJ SOLN: 10.0000 mg | Freq: Four times a day (QID) | INTRAMUSCULAR | Status: DC | PRN

## 2023-11-28 MED ORDER — ACETAMINOPHEN 325 MG PO TABS
650.0000 mg | ORAL_TABLET | Freq: Four times a day (QID) | ORAL | Status: DC | PRN
  Administered 2023-11-29: 650 mg via ORAL
  Filled 2023-11-28: qty 2

## 2023-11-28 MED ORDER — POTASSIUM CHLORIDE 10 MEQ/100ML IV SOLN
10.0000 meq | INTRAVENOUS | Status: AC
  Administered 2023-11-28 (×2): 10 meq via INTRAVENOUS
  Filled 2023-11-28 (×2): qty 100

## 2023-11-28 MED ORDER — POTASSIUM CHLORIDE 10 MEQ/100ML IV SOLN
10.0000 meq | INTRAVENOUS | Status: AC
  Administered 2023-11-28 – 2023-11-29 (×4): 10 meq via INTRAVENOUS
  Filled 2023-11-28 (×4): qty 100

## 2023-11-28 MED ORDER — METOCLOPRAMIDE HCL 5 MG/ML IJ SOLN
10.0000 mg | Freq: Once | INTRAMUSCULAR | Status: AC
  Administered 2023-11-28: 10 mg via INTRAVENOUS
  Filled 2023-11-28: qty 2

## 2023-11-28 MED ORDER — OXYCODONE-ACETAMINOPHEN 5-325 MG PO TABS
1.0000 | ORAL_TABLET | Freq: Once | ORAL | Status: AC
  Administered 2023-11-28: 1 via ORAL
  Filled 2023-11-28: qty 1

## 2023-11-28 MED ORDER — LABETALOL HCL 5 MG/ML IV SOLN
40.0000 mg | Freq: Once | INTRAVENOUS | Status: AC
  Administered 2023-11-28: 40 mg via INTRAVENOUS
  Filled 2023-11-28: qty 8

## 2023-11-28 MED ORDER — LABETALOL HCL 5 MG/ML IV SOLN
20.0000 mg | Freq: Once | INTRAVENOUS | Status: AC
  Administered 2023-11-28: 20 mg via INTRAVENOUS
  Filled 2023-11-28: qty 4

## 2023-11-28 MED ORDER — METOPROLOL TARTRATE 50 MG PO TABS
50.0000 mg | ORAL_TABLET | Freq: Two times a day (BID) | ORAL | Status: DC
  Administered 2023-11-28 – 2023-11-29 (×2): 50 mg via ORAL
  Filled 2023-11-28 (×2): qty 1

## 2023-11-28 MED ORDER — INSULIN REGULAR(HUMAN) IN NACL 100-0.9 UT/100ML-% IV SOLN
INTRAVENOUS | Status: DC
  Administered 2023-11-28 – 2023-11-29 (×2): 9 [IU]/h via INTRAVENOUS
  Filled 2023-11-28: qty 100

## 2023-11-28 MED ORDER — KETOROLAC TROMETHAMINE 30 MG/ML IJ SOLN
15.0000 mg | Freq: Once | INTRAMUSCULAR | Status: AC
  Administered 2023-11-28: 15 mg via INTRAVENOUS
  Filled 2023-11-28: qty 1

## 2023-11-28 MED ORDER — PROCHLORPERAZINE EDISYLATE 10 MG/2ML IJ SOLN
10.0000 mg | Freq: Four times a day (QID) | INTRAMUSCULAR | Status: DC | PRN
  Administered 2023-11-29: 10 mg via INTRAVENOUS
  Filled 2023-11-28: qty 2

## 2023-11-28 MED ORDER — ASPIRIN 81 MG PO CHEW
81.0000 mg | CHEWABLE_TABLET | Freq: Every day | ORAL | Status: DC
  Administered 2023-11-29: 81 mg via ORAL
  Filled 2023-11-28: qty 1

## 2023-11-28 MED ORDER — LACTATED RINGERS IV SOLN: INTRAVENOUS | Status: DC

## 2023-11-28 MED ORDER — LACTATED RINGERS IV BOLUS
20.0000 mL/kg | Freq: Once | INTRAVENOUS | Status: AC
  Administered 2023-11-28: 1412 mL via INTRAVENOUS

## 2023-11-28 MED ORDER — TRAZODONE HCL 50 MG PO TABS: 50.0000 mg | ORAL_TABLET | Freq: Every evening | ORAL | Status: DC | PRN

## 2023-11-28 MED ORDER — HEPARIN SODIUM (PORCINE) 5000 UNIT/ML IJ SOLN
5000.0000 [IU] | Freq: Three times a day (TID) | INTRAMUSCULAR | Status: DC
  Administered 2023-11-28 – 2023-11-29 (×3): 5000 [IU] via SUBCUTANEOUS
  Filled 2023-11-28 (×3): qty 1

## 2023-11-28 MED ORDER — SERTRALINE HCL 50 MG PO TABS
50.0000 mg | ORAL_TABLET | Freq: Every day | ORAL | Status: DC
Start: 2023-11-29 — End: 2023-11-29
  Administered 2023-11-29: 50 mg via ORAL
  Filled 2023-11-28: qty 1

## 2023-11-28 MED ORDER — CLOPIDOGREL BISULFATE 75 MG PO TABS
75.0000 mg | ORAL_TABLET | Freq: Every day | ORAL | Status: DC
  Administered 2023-11-29: 75 mg via ORAL
  Filled 2023-11-28: qty 1

## 2023-11-28 MED ORDER — ROPINIROLE HCL 1 MG PO TABS: 1.0000 mg | ORAL_TABLET | Freq: Every day | ORAL | Status: DC | PRN

## 2023-11-28 MED ORDER — ACETAMINOPHEN 650 MG RE SUPP: 650.0000 mg | Freq: Four times a day (QID) | RECTAL | Status: DC | PRN

## 2023-11-28 MED ORDER — DEXTROSE 50 % IV SOLN: 0.0000 mL | INTRAVENOUS | Status: DC | PRN

## 2023-11-28 MED ORDER — POTASSIUM CHLORIDE 10 MEQ/100ML IV SOLN
10.0000 meq | Freq: Once | INTRAVENOUS | Status: AC
  Administered 2023-11-29: 10 meq via INTRAVENOUS
  Filled 2023-11-28: qty 100

## 2023-11-28 NOTE — H&P (Signed)
 History and Physical    Patient: [name] UVO:[id number] DOB: 1951/04/11 DOA: 11/28/2023 DOS: the patient was seen and examined on 11/28/2023 PCP: Gabriel Earing, FNP  Patient coming from: Home  Chief Complaint:  Chief Complaint  Patient presents with   Facial Droop   HPI: [name] is a 73 year old female with a history of coronary artery disease status post CABG 02/19/23, hypertension, hyperlipidemia, diabetes mellitus type 2, TIA, depression, atrial tachycardia presenting with generalized weakness, dizziness, and headache that began on 11/24/2023.  The patient's son is at the bedside to help supplement the history.  He states that the patient had been in her usual state of health on 11/23/2023 when they went out to celebrate her birthday.  The patient stated that she began developing a headache and some dizziness and some nausea on 11/24/2023, and the symptoms have progressed to the point where she is having at least 3 episodes of nausea and vomiting on the day of admission.  She denies any hematemesis.  She stated she had a fever 102.0 F on 11/27/2023.  She denies any hematemesis, diarrhea, hematochezia, melena.  She denies any chest pain, shortness breath, hemoptysis, dysuria, hematuria. She denies any visual disturbance.  She denies any focal extremity weakness.  She denies any facial droop or dysesthesias.  She denies any word finding difficulties or dysarthria.  She states that her generalized weakness has gradually progressed.  She denies any new medications. In the ED, the patient had low-grade temperature overnight 9.3 F.  She was tachycardic into the 120s.  Her blood pressure was elevated up to 201/75.  Oxygen saturation was 94-97% on room air.  WBC 10.6, hemoglobin 13.4, platelets 188.  Sodium 135, potassium 3.5, bicarbonate 24, serum creatinine 1.76.  Serum glucose was 460.  Anion gap 23.  EKG showed sinus rhythm with no ST ST wave changes.  CT of the brain was negative for any acute  findings.  Patient was admitted for further evaluation and treatment of her generalized weakness and fever.  Review of Systems: As mentioned in the history of present illness. All other systems reviewed and are negative. Past Medical History:  Diagnosis Date   Anxiety    CAD (coronary artery disease)    DES to circumflex 02/2007, BMS to LAD and PTCA diagonal 03/2007   Carotid artery plaque    Mild   Cataract    Depression    Diverticulitis, colon    Elevated d-dimer 01/08/2014   Essential hypertension, benign    GERD (gastroesophageal reflux disease)    H/O hiatal hernia    HLD (hyperlipidemia)    IDDM (insulin dependent diabetes mellitus)    Migraine    "used to have them really bad; don't have them anymore" (01/07/2014)   MS (multiple sclerosis) (HCC)    Not confirmed   PAT (paroxysmal atrial tachycardia) (HCC)    Prolapse of uterus    PVD (peripheral vascular disease) (HCC)    TIA (transient ischemic attack) 1980's   Past Surgical History:  Procedure Laterality Date   ABDOMINAL HYSTERECTOMY  1986   ovaries remain - prolaspe uterus    APPENDECTOMY  ~ 1970   BREAST BIOPSY Right 1980's   BREAST LUMPECTOMY Right 1980's   Dr. Luan Moore    CARDIAC CATHETERIZATION  01/07/2014   CHOLECYSTECTOMY  ?1987   COLONOSCOPY  2002   Dr. Anwar--> Severe diverticular changes in the region of the sigmoid and descending colon with scattered diverticular changes throughout the rest  of the colon. No polyps, ulcerations. Despite numerous manipulations, the tip of the scope could not be tipped into the cecal area.   COLONOSCOPY  01/10/2012   Procedure: COLONOSCOPY;  Surgeon: Corbin Ade, MD;  Location: [facility];  Service: Endoscopy;  Laterality: N/A;  1:55   CORONARY ANGIOPLASTY WITH STENT PLACEMENT  ~ 1997 X 2   "2 + 1"   CORONARY ARTERY BYPASS GRAFT N/A 02/19/2023   Procedure: OFF PUMP CORONARY ARTERY BYPASS GRAFTING (CABG) X 1;  Surgeon: Corliss Skains, MD;  Location: [facility] OR;  Service:  Open Heart Surgery;  Laterality: N/A;  LIMA TO LAD   CORONARY BALLOON ANGIOPLASTY N/A 10/05/2022   Procedure: CORONARY BALLOON ANGIOPLASTY;  Surgeon: Tonny Bollman, MD;  Location: [facility] INVASIVE CV LAB;  Service: Cardiovascular;  Laterality: N/A;   CORONARY PRESSURE/FFR STUDY N/A 03/08/2017   Procedure: Intravascular Pressure Wire/FFR Study;  Surgeon: Yvonne Kendall, MD;  Location: [facility] INVASIVE CV LAB;  Service: Cardiovascular;  Laterality: N/A;   CORONARY STENT INTERVENTION N/A 03/26/2022   Procedure: CORONARY STENT INTERVENTION;  Surgeon: Runell Gess, MD;  Location: [facility] INVASIVE CV LAB;  Service: Cardiovascular;  Laterality: N/A;   EYE SURGERY Bilateral 2014   cataract   LEFT HEART CATH AND CORONARY ANGIOGRAPHY N/A 03/08/2017   Procedure: Left Heart Cath and Coronary Angiography;  Surgeon: Yvonne Kendall, MD;  Location: [facility] INVASIVE CV LAB;  Service: Cardiovascular;  Laterality: N/A;   LEFT HEART CATH AND CORONARY ANGIOGRAPHY N/A 03/26/2022   Procedure: LEFT HEART CATH AND CORONARY ANGIOGRAPHY;  Surgeon: Runell Gess, MD;  Location: [facility] INVASIVE CV LAB;  Service: Cardiovascular;  Laterality: N/A;   LEFT HEART CATH AND CORONARY ANGIOGRAPHY N/A 10/05/2022   Procedure: LEFT HEART CATH AND CORONARY ANGIOGRAPHY;  Surgeon: Tonny Bollman, MD;  Location: [facility] INVASIVE CV LAB;  Service: Cardiovascular;  Laterality: N/A;   LEFT HEART CATH AND CORONARY ANGIOGRAPHY N/A 02/14/2023   Procedure: LEFT HEART CATH AND CORONARY ANGIOGRAPHY;  Surgeon: [place], Peter M, MD;  Location: [facility] INVASIVE CV LAB;  Service: Cardiovascular;  Laterality: N/A;   LEFT HEART CATHETERIZATION WITH CORONARY ANGIOGRAM N/A 01/07/2014   Procedure: LEFT HEART CATHETERIZATION WITH CORONARY ANGIOGRAM;  Surgeon: Laurey Morale, MD;  Location: [facility] CATH LAB;  Service: Cardiovascular;  Laterality: N/A;   TEE WITHOUT CARDIOVERSION N/A 02/19/2023   Procedure: TRANSESOPHAGEAL ECHOCARDIOGRAM;  Surgeon: Corliss Skains, MD;  Location: [facility] OR;   Service: Open Heart Surgery;  Laterality: N/A;   Social History:  reports that she has never smoked. She has never used smokeless tobacco. She reports that she does not drink alcohol and does not use drugs.  Allergies  Allergen Reactions   Iohexol      Desc: pt had syncopal episode with nausea post IV CM late 1990's,  pt has had prednisone prep with heart caths x 2 without problem  kdean 04/16/07, Onset Date: [id number]    Ticlid [Ticlopidine Hcl] Nausea And Vomiting   Jardiance [Empagliflozin] Other (See Comments)    Recurrent UTIs   Metformin And Related Diarrhea   Codeine Nausea And Vomiting and Palpitations    Family History  Problem Relation Age of Onset   Heart attack Mother 36   Diabetes Mother    Hypertension Mother    Heart attack Father 79   Heart attack Brother 32       x 6   Heart disease Brother    Diabetes Brother    Colon cancer Paternal Aunt  73s, died with brain anuerysm   Crohn's disease Cousin        paternal   Diabetes Sister    GER disease Daughter    Cervical cancer Daughter    Diabetes Daughter     Prior to Admission medications   Medication Sig Start Date End Date Taking? Authorizing Provider  alendronate (FOSAMAX) 70 MG tablet Take 1 tablet (70 mg total) by mouth every 7 (seven) days. Take with a full glass of water on an empty stomach. 02/06/23   Gabriel Earing, FNP  aspirin 81 MG chewable tablet Chew 1 tablet (81 mg total) by mouth daily. 03/28/22   Rai, Delene Ruffini, MD  Blood Glucose Monitoring Suppl DEVI 1 each by Does not apply route in the morning, at noon, and at bedtime. May substitute to any manufacturer covered by patient's insurance. 06/19/23   Gabriel Earing, FNP  clopidogrel (PLAVIX) 75 MG tablet Take 1 tablet (75 mg total) by mouth daily with breakfast. 10/02/23   Lewayne Bunting, MD  Continuous Glucose Sensor (FREESTYLE LIBRE 3 SENSOR) MISC PLACE 1 SENSOR ON THE SKIN EVERY 14 DAYS 05/16/23   Jannifer Rodney A, FNP  diphenhydrAMINE  (BENADRYL) 25 mg capsule Take 25 mg by mouth every 6 (six) hours as needed for allergies.    [provider]  furosemide (LASIX) 20 MG tablet Take 1 tablet (20 mg total) by mouth daily. 10/02/23   Gabriel Earing, FNP  insulin aspart (NOVOLOG FLEXPEN) 100 UNIT/ML FlexPen Inject 36 Units into the skin 3 (three) times daily with meals. Patient taking differently: Inject 25-36 Units into the skin 3 (three) times daily with meals. 10/06/22   Joseph Art, DO  insulin degludec (TRESIBA) 200 UNIT/ML FlexTouch Pen Inject 30-60 Units into the skin daily. Pt states she takes 60 units if she eats and 30 units if she does not eat    [provider]  levocetirizine (XYZAL) 5 MG tablet Take 0.5 tablets (2.5 mg total) by mouth every evening. 03/06/23   Gabriel Earing, FNP  lisinopril (ZESTRIL) 10 MG tablet Take 1 tablet (10 mg total) by mouth daily. 11/21/22   Rollene Rotunda, MD  metoprolol tartrate (LOPRESSOR) 25 MG tablet Take 0.5 tablets (12.5 mg total) by mouth 2 (two) times daily. 04/02/23   Leary Roca, PA-C  nitroGLYCERIN (NITROSTAT) 0.4 MG SL tablet DISSOLVE 1 TABLET UNDER TONGUE FOR CHESTPAIN.MAY REPEAT EVERY 5 MINUTES FOR 3 DOSES.IF NO RELIEF CALL 911 OR GO TO ER 03/27/22   Rai, Ripudeep K, MD  potassium chloride (KLOR-CON) 10 MEQ tablet TAKE ONE (1) TABLET BY MOUTH TWO (2) TIMES DAILY 06/17/23   Gabriel Earing, FNP  rOPINIRole (REQUIP) 0.5 MG tablet Take 2 tablets (1 mg total) by mouth daily as needed (restless leg). 10/06/22   Joseph Art, DO  rosuvastatin (CRESTOR) 20 MG tablet TAKE ONE (1) TABLET BY MOUTH EVERY DAY 10/02/23   Gabriel Earing, FNP  Semaglutide, 2 MG/DOSE, 8 MG/3ML SOPN Inject 2 mg as directed once a week. Patient taking differently: Inject 1 mg as directed once a week. 07/26/23   Gabriel Earing, FNP  sertraline (ZOLOFT) 50 MG tablet Take 1 tablet (50 mg total) by mouth daily. 05/08/23   Gabriel Earing, FNP  traZODone (DESYREL) 50 MG tablet Take 1  tablet (50 mg total) by mouth at bedtime as needed for sleep. 05/08/23   Gabriel Earing, FNP    Physical Exam: Vitals:   11/28/23 1345  11/28/23 1400 11/28/23 1415 11/28/23 1426  BP: (!) 207/95 (!) 201/79 (!) 168/69   Pulse: 100 99 93 89  Resp: 16  20 15   Temp:      TempSrc:      SpO2: 100% 95% 98% 94%  Weight:      Height:       GENERAL:  A&O x 3, NAD, well developed, cooperative, follows commands HEENT: [place]/AT, No thrush, No icterus, No oral ulcers Neck:  No neck mass, No meningismus, soft, supple CV: RRR, no S3, no S4, no rub, no JVD Lungs: Bibasilar rales.  No wheeze. Abd: soft/NT +BS, nondistended Ext: No edema, no lymphangitis, no cyanosis, no rashes Neuro:  CN II-XII intact, strength 4/5 in RUE, RLE, strength 4/5 LUE, LLE; sensation intact bilateral; no dysmetria; babinski equivocal  Data Reviewed: Data reviewed above in the history  Assessment and Plan: Hyperosmolar nonketotic state -patient started on IV insulin with q 1 hour CBG check and q 4 hour BMPs -pt started on aggressive fluid resuscitation -Electrolytes were monitored and repleted -transitioned to [place] insulin once anion gap closed -diet was advanced once anion gap closed -10/09/2023 HbA1C--7.4  Fever -Blood cultures x 2 sets -PCT -Chest x-ray -check COVID-19 and Flu/RSV -UA and culture  Acute on chronic renal failure--CKD stage IIIb -Baseline creatinine 1.1-1.3 -Presented with serum creatinine 1.76 -Secondary to volume depletion -hold lisinopril  Uncontrolled diabetes mellitus type 2 with hyperglycemia -Recheck hemoglobin A1c  Essential hypertension/HTN urgency -Restart metoprolol -prn hydralazine  Coronary artery disease -Status post CABG 02/19/2023 -No chest pain presently -Continue aspirin and Plavix  Depression -Continue sertraline  Mixed hyperlipidemia -Continue statin     Advance Care Planning: FULL  Consults: none  Family Communication: son 11/27/13  Severity of  Illness: The appropriate patient status for this patient is OBSERVATION. Observation status is judged to be reasonable and necessary in order to provide the required intensity of service to ensure the patient's safety. The patient's presenting symptoms, physical exam findings, and initial radiographic and laboratory data in the context of their medical condition is felt to place them at decreased risk for further clinical deterioration. Furthermore, it is anticipated that the patient will be medically stable for discharge from the hospital within 2 midnights of admission.   Author: Catarina Hartshorn, MD 11/28/2023 2:27 PM  For on call review [email or web address].

## 2023-11-28 NOTE — ED Triage Notes (Signed)
 Pt BIB ems from home for left side facial droop. Pt has been having a headache and dizziness for the past 3 days. Per EMS pt has history of stroke. [facility] during triage 0 pt still complaining of headache no dizziness. BP noted at 193/74 pt did not take BP medication states it does not work.

## 2023-11-28 NOTE — ED Notes (Signed)
Dr Tat at bedside

## 2023-11-28 NOTE — Hospital Course (Signed)
 73 year old female with a history of coronary artery disease status post CABG 02/19/23, hypertension, hyperlipidemia, diabetes mellitus type 2, TIA, depression, atrial tachycardia presenting with generalized weakness, dizziness, and headache that began on 11/24/2023.  The patient's son is at the bedside to help supplement the history.  He states that the patient had been in her usual state of health on 11/23/2023 when they went out to celebrate her birthday.  The patient stated that she began developing a headache and some dizziness and some nausea on 11/24/2023, and the symptoms have progressed to the point where she is having at least 3 episodes of nausea and vomiting on the day of admission.  She denies any hematemesis.  She stated she had a fever 102.0 F on 11/27/2023.  She denies any hematemesis, diarrhea, hematochezia, melena.  She denies any chest pain, shortness breath, hemoptysis, dysuria, hematuria. She denies any visual disturbance.  She denies any focal extremity weakness.  She denies any facial droop or dysesthesias.  She denies any word finding difficulties or dysarthria.  She states that her generalized weakness has gradually progressed.  She denies any new medications. In the ED, the patient had low-grade temperature overnight 9.3 F.  She was tachycardic into the 120s.  Her blood pressure was elevated up to 201/75.  Oxygen saturation was 94-97% on room air.  WBC 10.6, hemoglobin 13.4, platelets 188.  Sodium 135, potassium 3.5, bicarbonate 24, serum creatinine 1.76.  Serum glucose was 460.  Anion gap 23.  EKG showed sinus rhythm with no ST ST wave changes.  CT of the brain was negative for any acute findings.  Patient was admitted for further evaluation and treatment of her generalized weakness and fever.

## 2023-11-28 NOTE — ED Provider Notes (Signed)
 [place] EMERGENCY DEPARTMENT AT [facility] Provider Note   CSN: [id number] Arrival date & time: 11/28/23  4098     History  Chief Complaint  Patient presents with   Facial Droop    [name] is a 73 y.o. female.  Patient has a history of hypertension diabetes and presents with a headache and questionable facial droop.  On initial exam the facial droop was not appreciated by myself or the nurse  The history is provided by the patient and medical records. No language interpreter was used.  Headache Pain location:  Generalized Quality:  Dull Radiates to:  Does not radiate Severity currently:  7/10 Severity at highest:  8/10 Onset quality:  Sudden Timing:  Constant Progression:  Worsening Chronicity:  New Similar to prior headaches: no   Context: not activity   Relieved by:  Nothing Worsened by:  Nothing Associated symptoms: no abdominal pain, no back pain, no congestion, no cough, no diarrhea, no fatigue, no seizures and no sinus pressure        Home Medications Prior to Admission medications   Medication Sig Start Date End Date Taking? Authorizing Provider  alendronate (FOSAMAX) 70 MG tablet Take 1 tablet (70 mg total) by mouth every 7 (seven) days. Take with a full glass of water on an empty stomach. 02/06/23   Gabriel Earing, FNP  aspirin 81 MG chewable tablet Chew 1 tablet (81 mg total) by mouth daily. 03/28/22   Rai, Delene Ruffini, MD  Blood Glucose Monitoring Suppl DEVI 1 each by Does not apply route in the morning, at noon, and at bedtime. May substitute to any manufacturer covered by patient's insurance. 06/19/23   Gabriel Earing, FNP  clopidogrel (PLAVIX) 75 MG tablet Take 1 tablet (75 mg total) by mouth daily with breakfast. 10/02/23   Lewayne Bunting, MD  Continuous Glucose Sensor (FREESTYLE LIBRE 3 SENSOR) MISC PLACE 1 SENSOR ON THE SKIN EVERY 14 DAYS 05/16/23   Jannifer Rodney A, FNP  diphenhydrAMINE (BENADRYL) 25 mg capsule Take 25 mg by mouth  every 6 (six) hours as needed for allergies.    [provider]  furosemide (LASIX) 20 MG tablet Take 1 tablet (20 mg total) by mouth daily. 10/02/23   Gabriel Earing, FNP  insulin aspart (NOVOLOG FLEXPEN) 100 UNIT/ML FlexPen Inject 36 Units into the skin 3 (three) times daily with meals. Patient taking differently: Inject 25-36 Units into the skin 3 (three) times daily with meals. 10/06/22   Pius Byrom Art, DO  insulin degludec (TRESIBA) 200 UNIT/ML FlexTouch Pen Inject 30-60 Units into the skin daily. Pt states she takes 60 units if she eats and 30 units if she does not eat    [provider]  levocetirizine (XYZAL) 5 MG tablet Take 0.5 tablets (2.5 mg total) by mouth every evening. 03/06/23   Gabriel Earing, FNP  lisinopril (ZESTRIL) 10 MG tablet Take 1 tablet (10 mg total) by mouth daily. 11/21/22   Rollene Rotunda, MD  metoprolol tartrate (LOPRESSOR) 25 MG tablet Take 0.5 tablets (12.5 mg total) by mouth 2 (two) times daily. 04/02/23   Leary Roca, PA-C  nitroGLYCERIN (NITROSTAT) 0.4 MG SL tablet DISSOLVE 1 TABLET UNDER TONGUE FOR CHESTPAIN.MAY REPEAT EVERY 5 MINUTES FOR 3 DOSES.IF NO RELIEF CALL 911 OR GO TO ER 03/27/22   Rai, Ripudeep K, MD  potassium chloride (KLOR-CON) 10 MEQ tablet TAKE ONE (1) TABLET BY MOUTH TWO (2) TIMES DAILY 06/17/23   Gabriel Earing,  FNP  rOPINIRole (REQUIP) 0.5 MG tablet Take 2 tablets (1 mg total) by mouth daily as needed (restless leg). 10/06/22   Surah Pelley Art, DO  rosuvastatin (CRESTOR) 20 MG tablet TAKE ONE (1) TABLET BY MOUTH EVERY DAY 10/02/23   Gabriel Earing, FNP  Semaglutide, 2 MG/DOSE, 8 MG/3ML SOPN Inject 2 mg as directed once a week. Patient taking differently: Inject 1 mg as directed once a week. 07/26/23   Gabriel Earing, FNP  sertraline (ZOLOFT) 50 MG tablet Take 1 tablet (50 mg total) by mouth daily. 05/08/23   Gabriel Earing, FNP  traZODone (DESYREL) 50 MG tablet Take 1 tablet (50 mg total) by mouth at bedtime as  needed for sleep. 05/08/23   Gabriel Earing, FNP      Allergies    Iohexol, Ticlid [ticlopidine hcl], Jardiance [empagliflozin], Metformin and related, and Codeine    Review of Systems   Review of Systems  Constitutional:  Negative for appetite change and fatigue.  HENT:  Negative for congestion, ear discharge and sinus pressure.   Eyes:  Negative for discharge.  Respiratory:  Negative for cough.   Cardiovascular:  Negative for chest pain.  Gastrointestinal:  Negative for abdominal pain and diarrhea.  Genitourinary:  Negative for frequency and hematuria.  Musculoskeletal:  Negative for back pain.  Skin:  Negative for rash.  Neurological:  Positive for headaches. Negative for seizures.  Psychiatric/Behavioral:  Negative for hallucinations.     Physical Exam Updated Vital Signs BP (!) 201/79   Pulse 99   Temp 99.3 F (37.4 C) (Oral)   Resp 16   Ht 5\' 6"  (1.676 m)   Wt 70.6 kg   SpO2 95%   BMI 25.13 kg/m  Physical Exam Vitals and nursing note reviewed.  Constitutional:      Appearance: She is well-developed.  HENT:     Head: Normocephalic.     Nose: Nose normal.  Eyes:     General: No scleral icterus.    Conjunctiva/sclera: Conjunctivae normal.  Neck:     Thyroid: No thyromegaly.  Cardiovascular:     Rate and Rhythm: Normal rate and regular rhythm.     Heart sounds: No murmur heard.    No friction rub. No gallop.  Pulmonary:     Breath sounds: No stridor. No wheezing or rales.  Chest:     Chest wall: No tenderness.  Abdominal:     General: There is no distension.     Tenderness: There is no abdominal tenderness. There is no rebound.  Musculoskeletal:        General: Normal range of motion.     Cervical back: Neck supple.  Lymphadenopathy:     Cervical: No cervical adenopathy.  Skin:    Findings: No erythema or rash.  Neurological:     Mental Status: She is alert and oriented to person, place, and time.     Motor: No abnormal muscle tone.      Coordination: Coordination normal.  Psychiatric:        Behavior: Behavior normal.     ED Results / Procedures / Treatments   Labs (all labs ordered are listed, but only abnormal results are displayed) Labs Reviewed  CBC WITH DIFFERENTIAL/PLATELET - Abnormal; Notable for the following components:      Result Value   WBC 10.6 (*)    Neutro Abs 9.3 (*)    Lymphs Abs 0.5 (*)    All other components within normal limits  BASIC METABOLIC  PANEL - Abnormal; Notable for the following components:   Chloride 88 (*)    Glucose, Bld 460 (*)    BUN 38 (*)    Creatinine, Ser 1.76 (*)    GFR, Estimated 30 (*)    Anion gap 23 (*)    All other components within normal limits  CBG MONITORING, ED - Abnormal; Notable for the following components:   Glucose-Capillary 471 (*)    All other components within normal limits  BETA-HYDROXYBUTYRIC ACID    EKG None  Radiology CT Head Wo Contrast Result Date: 11/28/2023 CLINICAL DATA:  73 year old female with headache, dizziness, left facial droop. EXAM: CT HEAD WITHOUT CONTRAST TECHNIQUE: Contiguous axial images were obtained from the base of the skull through the vertex without intravenous contrast. RADIATION DOSE REDUCTION: This exam was performed according to the departmental dose-optimization program which includes automated exposure control, adjustment of the mA and/or kV according to patient size and/or use of iterative reconstruction technique. COMPARISON:  Brain MRI 07/20/2023.  Head CT 07/13/2023. FINDINGS: Brain: Stable cerebral volume. No midline shift, ventriculomegaly, mass effect, evidence of mass lesion, intracranial hemorrhage or evidence of cortically based acute infarction. Stable gray-white matter differentiation throughout the brain. Mild for age cerebral white matter changes better demonstrated by MRI. Vascular: Calcified atherosclerosis at the skull base. No suspicious intracranial vascular hyperdensity. Skull: Stable, intact.  Sinuses/Orbits: Visualized paranasal sinuses and mastoids are stable and well aerated. Other: No acute orbit or scalp soft tissue finding. IMPRESSION: No acute intracranial abnormality. Stable non contrast CT appearance of the brain. Electronically Signed   By: Odessa Fleming M.D.   On: 11/28/2023 12:10    Procedures Procedures    Medications Ordered in ED Medications  sodium chloride 0.9 % bolus 1,000 mL (has no administration in time range)  labetalol (NORMODYNE) injection 40 mg (has no administration in time range)  ketorolac (TORADOL) 30 MG/ML injection 15 mg (15 mg Intravenous Given 11/28/23 1117)  metoCLOPramide (REGLAN) injection 10 mg (10 mg Intravenous Given 11/28/23 1209)  labetalol (NORMODYNE) injection 20 mg (20 mg Intravenous Given 11/28/23 1227)  oxyCODONE-acetaminophen (PERCOCET/ROXICET) 5-325 MG per tablet 1 tablet (1 tablet Oral Given 11/28/23 1225)    ED Course/ Medical Decision Making/ A&P  CRITICAL CARE Performed by: Bethann Berkshire Total critical care time: 45 minutes Critical care time was exclusive of separately billable procedures and treating other patients. Critical care was necessary to treat or prevent imminent or life-threatening deterioration. Critical care was time spent personally by me on the following activities: development of treatment plan with patient and/or surrogate as well as nursing, discussions with consultants, evaluation of patient's response to treatment, examination of patient, obtaining history from patient or surrogate, ordering and performing treatments and interventions, ordering and review of laboratory studies, ordering and review of radiographic studies, pulse oximetry and re-evaluation of patient's condition.                                Medical Decision Making Amount and/or Complexity of Data Reviewed Labs: ordered. Radiology: ordered.  Risk Prescription drug management. Decision regarding hospitalization.  This patient presents to the ED  for concern of headache, this involves an extensive number of treatment options, and is a complaint that carries with it a high risk of complications and morbidity.  The differential diagnosis includes stroke, migraine   Co morbidities that complicate the patient evaluation  Hypertension and diabetes   Additional history obtained:  Additional  history obtained from patient External records from outside source obtained and reviewed including hospital records   Lab Tests:  I Ordered, and personally interpreted labs.  The pertinent results include: Glucose 275, creatinine 1.35   Imaging Studies ordered:  I ordered imaging studies including CT head I independently visualized and interpreted imaging which showed negative I agree with the radiologist interpretation   Cardiac Monitoring: / EKG:  The patient was maintained on a cardiac monitor.  I personally viewed and interpreted the cardiac monitored which showed an underlying rhythm of: Normal sinus rhythm   Consultations Obtained:  I requested consultation with the hospitalist,  and discussed lab and imaging findings as well as pertinent plan - they recommend: Admit for hypertensive urgency and AKI   Problem List / ED Course / Critical interventions / Medication management  Hypertensive urgency and AKI I ordered medication including labetalol and normal saline Reevaluation of the patient after these medicines showed that the patient improved I have reviewed the patients home medicines and have made adjustments as needed   Social Determinants of Health:  None   Test / Admission - Considered:  None     Hypertensive urgency with poorly controlled diabetes and AKI.  She will be admitted to medicine        Final Clinical Impression(s) / ED Diagnoses Final diagnoses:  Hypertensive urgency  Poorly controlled diabetes mellitus (HCC)    Rx / DC Orders ED Discharge Orders     None         Bethann Berkshire, MD 12/01/23 1044

## 2023-11-28 NOTE — Inpatient Diabetes Management (Signed)
 Inpatient Diabetes Program Recommendations  AACE/ADA: New Consensus Statement on Inpatient Glycemic Control (2015)  Target Ranges:  Prepandial:   less than 140 mg/dL      Peak postprandial:   less than 180 mg/dL (1-2 hours)      Critically ill patients:  140 - 180 mg/dL   Lab Results  Component Value Date   GLUCAP 471 (H) 11/28/2023   HGBA1C 7.4 (H) 10/09/2023    Latest Reference Range & Units 11/28/23 10:20  Sodium 135 - 145 mmol/L 135  Potassium 3.5 - 5.1 mmol/L 3.5  Chloride 98 - 111 mmol/L 88 (L)  CO2 22 - 32 mmol/L 24  Glucose 70 - 99 mg/dL 161 (H)  BUN 8 - 23 mg/dL 38 (H)  Creatinine 0.96 - 1.00 mg/dL 0.45 (H)  Calcium 8.9 - 10.3 mg/dL 9.6  Anion gap 5 - 15  23 (H)  (L): Data is abnormally low (H): Data is abnormally high  Diabetes history: DM2 Outpatient Diabetes medications: Tresiba 60 units if eats, 30 units if doesn't eat, Novolog 25-36 units tid meal coverage, Ozempic 1 mg weekly (out of Ozempic end January & Refilled Ozempic and insulin on 11/20/23.) Current orders for Inpatient glycemic control: IV insulin  Inpatient Diabetes Program Recommendations:   Patient is currently in ED. Last office visit with PCP office was 10/09/23.  Notes from office visit on 10/09/23: "Current diabetic medications include tresiba 60 units daily, novolog 36 units with meals (only eats 2 small meals a day), decreased ozempic to 1 mg due to side effects about 2 weeks ago but she has been out this due to supply issues with patient assistance."  Refilled Ozempic and insulin on 11/20/23.  Will follow and assist as needed. Thank you, [name]. Karen Prell, RN, MSN, CDCES  Diabetes Coordinator Inpatient Glycemic Control Team Team Pager [phone] (8am-5pm) 11/28/2023 2:50 PM

## 2023-11-28 NOTE — ED Notes (Signed)
 Pts BP cycling high at 215/91 and pulse of 117--MD made aware

## 2023-11-28 NOTE — ED Notes (Signed)
 ED Provider at bedside.

## 2023-11-29 DIAGNOSIS — I160 Hypertensive urgency: Secondary | ICD-10-CM | POA: Diagnosis not present

## 2023-11-29 DIAGNOSIS — R112 Nausea with vomiting, unspecified: Secondary | ICD-10-CM | POA: Diagnosis not present

## 2023-11-29 DIAGNOSIS — Z794 Long term (current) use of insulin: Secondary | ICD-10-CM | POA: Diagnosis not present

## 2023-11-29 DIAGNOSIS — E1100 Type 2 diabetes mellitus with hyperosmolarity without nonketotic hyperglycemic-hyperosmolar coma (NKHHC): Secondary | ICD-10-CM | POA: Diagnosis not present

## 2023-11-29 DIAGNOSIS — N179 Acute kidney failure, unspecified: Secondary | ICD-10-CM | POA: Diagnosis not present

## 2023-11-29 DIAGNOSIS — E1165 Type 2 diabetes mellitus with hyperglycemia: Secondary | ICD-10-CM | POA: Diagnosis not present

## 2023-11-29 DIAGNOSIS — N1832 Chronic kidney disease, stage 3b: Secondary | ICD-10-CM | POA: Diagnosis not present

## 2023-11-29 LAB — GLUCOSE, CAPILLARY
Glucose-Capillary: 169 mg/dL — ABNORMAL HIGH (ref 70–99)
Glucose-Capillary: 164 mg/dL — ABNORMAL HIGH (ref 70–99)
Glucose-Capillary: 186 mg/dL — ABNORMAL HIGH (ref 70–99)
Glucose-Capillary: 201 mg/dL — ABNORMAL HIGH (ref 70–99)
Glucose-Capillary: 205 mg/dL — ABNORMAL HIGH (ref 70–99)
Glucose-Capillary: 183 mg/dL — ABNORMAL HIGH (ref 70–99)
Glucose-Capillary: 193 mg/dL — ABNORMAL HIGH (ref 70–99)
Glucose-Capillary: 189 mg/dL — ABNORMAL HIGH (ref 70–99)
Glucose-Capillary: 134 mg/dL — ABNORMAL HIGH (ref 70–99)
Glucose-Capillary: 111 mg/dL — ABNORMAL HIGH (ref 70–99)
Glucose-Capillary: 93 mg/dL (ref 70–99)

## 2023-11-29 LAB — CBC
HCT: 33.9 % — ABNORMAL LOW (ref 36.0–46.0)
Hemoglobin: 11.4 g/dL — ABNORMAL LOW (ref 12.0–15.0)
MCH: 29.9 pg (ref 26.0–34.0)
MCHC: 33.6 g/dL (ref 30.0–36.0)
MCV: 89 fL (ref 80.0–100.0)
Platelets: 148 10*3/uL — ABNORMAL LOW (ref 150–400)
RBC: 3.81 MIL/uL — ABNORMAL LOW (ref 3.87–5.11)
RDW: 13.5 % (ref 11.5–15.5)
WBC: 9.8 10*3/uL (ref 4.0–10.5)
nRBC: 0 % (ref 0.0–0.2)

## 2023-11-29 LAB — BASIC METABOLIC PANEL
Anion gap: 9 (ref 5–15)
Anion gap: 9 (ref 5–15)
Anion gap: 10 (ref 5–15)
BUN: 33 mg/dL — ABNORMAL HIGH (ref 8–23)
BUN: 28 mg/dL — ABNORMAL HIGH (ref 8–23)
BUN: 25 mg/dL — ABNORMAL HIGH (ref 8–23)
CO2: 26 mmol/L (ref 22–32)
CO2: 28 mmol/L (ref 22–32)
CO2: 28 mmol/L (ref 22–32)
Calcium: 8.6 mg/dL — ABNORMAL LOW (ref 8.9–10.3)
Calcium: 8.7 mg/dL — ABNORMAL LOW (ref 8.9–10.3)
Calcium: 8.8 mg/dL — ABNORMAL LOW (ref 8.9–10.3)
Chloride: 100 mmol/L (ref 98–111)
Chloride: 99 mmol/L (ref 98–111)
Chloride: 100 mmol/L (ref 98–111)
Creatinine, Ser: 1.18 mg/dL — ABNORMAL HIGH (ref 0.44–1.00)
Creatinine, Ser: 1.13 mg/dL — ABNORMAL HIGH (ref 0.44–1.00)
Creatinine, Ser: 1.09 mg/dL — ABNORMAL HIGH (ref 0.44–1.00)
GFR, Estimated: 48 mL/min — ABNORMAL LOW (ref >60)
GFR, Estimated: 51 mL/min — ABNORMAL LOW (ref >60)
GFR, Estimated: 53 mL/min — ABNORMAL LOW (ref >60)
Glucose, Bld: 169 mg/dL — ABNORMAL HIGH (ref 70–99)
Glucose, Bld: 209 mg/dL — ABNORMAL HIGH (ref 70–99)
Glucose, Bld: 118 mg/dL — ABNORMAL HIGH (ref 70–99)
Potassium: 3.4 mmol/L — ABNORMAL LOW (ref 3.5–5.1)
Potassium: 3.3 mmol/L — ABNORMAL LOW (ref 3.5–5.1)
Potassium: 3.3 mmol/L — ABNORMAL LOW (ref 3.5–5.1)
Sodium: 135 mmol/L (ref 135–145)
Sodium: 136 mmol/L (ref 135–145)
Sodium: 138 mmol/L (ref 135–145)

## 2023-11-29 LAB — MAGNESIUM: Magnesium: 1.9 mg/dL (ref 1.7–2.4)

## 2023-11-29 LAB — MRSA NEXT GEN BY PCR, NASAL: MRSA by PCR Next Gen: NOT DETECTED

## 2023-11-29 LAB — CBG MONITORING, ED
Glucose-Capillary: 221 mg/dL — ABNORMAL HIGH (ref 70–99)
Glucose-Capillary: 278 mg/dL — ABNORMAL HIGH (ref 70–99)

## 2023-11-29 MED ORDER — POTASSIUM CHLORIDE CRYS ER 20 MEQ PO TBCR
40.0000 meq | EXTENDED_RELEASE_TABLET | Freq: Once | ORAL | Status: AC
  Administered 2023-11-29: 40 meq via ORAL
  Filled 2023-11-29: qty 2

## 2023-11-29 MED ORDER — METOPROLOL TARTRATE 25 MG PO TABS: 50.0000 mg | ORAL_TABLET | Freq: Two times a day (BID) | ORAL | 2 refills | Status: DC

## 2023-11-29 MED ORDER — INSULIN GLARGINE 100 UNIT/ML ~~LOC~~ SOLN
15.0000 [IU] | Freq: Every day | SUBCUTANEOUS | Status: DC
  Filled 2023-11-29 (×2): qty 0.15

## 2023-11-29 MED ORDER — CHLORHEXIDINE GLUCONATE CLOTH 2 % EX PADS
6.0000 | MEDICATED_PAD | Freq: Every day | CUTANEOUS | Status: DC
  Administered 2023-11-29: 6 via TOPICAL

## 2023-11-29 MED ORDER — INSULIN ASPART 100 UNIT/ML IJ SOLN: 0.0000 [IU] | Freq: Every day | INTRAMUSCULAR | Status: DC

## 2023-11-29 MED ORDER — POTASSIUM CHLORIDE IN NACL 20-0.9 MEQ/L-% IV SOLN: INTRAVENOUS | Status: DC

## 2023-11-29 MED ORDER — INSULIN GLARGINE-YFGN 100 UNIT/ML ~~LOC~~ SOLN
15.0000 [IU] | Freq: Every day | SUBCUTANEOUS | Status: DC
  Administered 2023-11-29: 15 [IU] via SUBCUTANEOUS
  Filled 2023-11-29 (×2): qty 0.15

## 2023-11-29 MED ORDER — INSULIN ASPART 100 UNIT/ML IJ SOLN
0.0000 [IU] | Freq: Three times a day (TID) | INTRAMUSCULAR | Status: DC
Start: 2023-11-29 — End: 2023-11-29

## 2023-11-29 NOTE — Inpatient Diabetes Management (Addendum)
 Inpatient Diabetes Program Recommendations  AACE/ADA: New Consensus Statement on Inpatient Glycemic Control (2015)  Target Ranges:  Prepandial:   less than 140 mg/dL      Peak postprandial:   less than 180 mg/dL (1-2 hours)      Critically ill patients:  140 - 180 mg/dL    Latest Reference Range & Units 10/09/23 10:41  HB A1C (BAYER DCA - WAIVED) 4.8 - 5.6 % 7.4 (H)  (H): Data is abnormally high  Latest Reference Range & Units 11/28/23 15:02  Hemoglobin A1C 4.8 - 5.6 % 9.1 (H)  (H): Data is abnormally high  Latest Reference Range & Units 11/28/23 18:31 11/28/23 19:38 11/29/23 00:04 11/29/23 01:19 11/29/23 02:30 11/29/23 03:34 11/29/23 04:41 11/29/23 05:43 11/29/23 06:47  Glucose-Capillary 70 - 99 mg/dL 413 (H) 244 (H) 010 (H) 221 (H) 169 (H) 164 (H) 186 (H) 201 (H) 205 (H)  IV Insulin Drip Running  (H): Data is abnormally high   Admit with: Elevated blood sugar and volume depletion. Initially had mildly elevated anion gap. Treated with fluids and insulin drip.   History: DM  Home DM Meds: Tresiba 60 units if eats, 30 units if doesn't eat      Novolog 25-36 units tid meal coverage     Ozempic 1 mg weekly      (out of Ozempic end January & Refilled Ozempic and insulin on 11/20/23)  Current Orders: IV Insulin Drip    Last office visit with PCP office was 10/09/23  Notes from office visit on 10/09/23: "Current diabetic medications include tresiba 60 units daily, novolog 36 units with meals (only eats 2 small meals a day), decreased ozempic to 1 mg due to side effects about 2 weeks ago but she has been out this due to supply issues with patient assistance."  Refilled Ozempic and insulin on 11/20/23.    MD- Note 4am BMET shows the following: Anion Gap 9 CO2 26 No Beta Hydroxybutyric acid level this AM  When you allow pt to transition to SQ Insulin, please consider:  1. Start Lantus 30 units daily (50% home dose) Continue IV Insulin Drip for 2 hours after Lantus given and then  can d/c the IV Insulin Drip  2. Start Novolog Moderate Correction Scale/ SSI (0-15 units) TID AC + HS  3. Start Novolog Meal Coverage: Novolog 6 units TID with meals HOLD if pt NPO HOLD if pt eats <50% meals     --Will follow patient during hospitalization--  Ambrose Finland RN, MSN, CDCES Diabetes Coordinator Inpatient Glycemic Control Team Team Pager: [phone] (8a-5p)

## 2023-11-29 NOTE — Progress Notes (Signed)
 eLink Physician-Brief Progress Note Patient Name: KIALEE KHAM DOB: 1951/02/11 MRN: [id number]   Date of Service  11/29/2023  HPI/Events of Note  73 yr old diabetic female with CAD presented with weakness, dizzyness, HA, and fever,  Found to have elevated blood sugar and volume depletion.  Initially had mildly elevated anion gap.  Treated with fluids and insulin drip.  Blood sugar improved.  No obvious sign of infection.  Blood cultures sent.  eICU Interventions  Chart reviewed     Intervention Category Evaluation Type: New Patient Evaluation  Henry Russel, P 11/29/2023, 4:45 AM

## 2023-11-29 NOTE — Progress Notes (Signed)
   11/29/23 1522  TOC Brief Assessment  Insurance and Status Reviewed  Patient has primary care physician Yes  Home environment has been reviewed Home alone  Prior level of function: independent  Prior/Current Home Services No current home services  Social Drivers of Health Review SDOH reviewed no interventions necessary  Readmission risk has been reviewed Yes  Transition of care needs no transition of care needs at this time    Discharging home no needs.  Transition of Care Department [facility]) has reviewed patient and no TOC needs have been identified at this time. We will continue to monitor patient advancement through interdisciplinary progression rounds. If new patient transition needs arise, please place a TOC consult.

## 2023-11-29 NOTE — Care Management Obs Status (Signed)
 MEDICARE OBSERVATION STATUS NOTIFICATION   Patient Details  Name: [name] MRN: [id number] Date of Birth: May 19, 1951   Medicare Observation Status Notification Given:  Yes    Corey Harold 11/29/2023, 3:28 PM

## 2023-11-29 NOTE — ED Notes (Signed)
 ..ED TO INPATIENT HANDOFF REPORT  ED Nurse Name and Phone #: [phone]  S Name/Age/Gender [name] 73 y.o. female Room/Bed: APA18/APA18  Code Status   Code Status: Full Code  Home/SNF/Other Home Patient oriented to: self, place, time, and situation Is this baseline? Yes   Triage Complete: Triage complete  Chief Complaint Hyperosmolar hyperglycemic state (HHS) (HCC) [E11.00]  Triage Note Pt BIB ems from home for left side facial droop. Pt has been having a headache and dizziness for the past 3 days. Per EMS pt has history of stroke. [facility] during triage 0 pt still complaining of headache no dizziness. BP noted at 193/74 pt did not take BP medication states it does not work.    Allergies Allergies  Allergen Reactions   Iohexol      Desc: pt had syncopal episode with nausea post IV CM late 1990's,  pt has had prednisone prep with heart caths x 2 without problem  kdean 04/16/07, Onset Date: [id number]    Ticlid [Ticlopidine Hcl] Nausea And Vomiting   Jardiance [Empagliflozin] Other (See Comments)    Recurrent UTIs   Metformin And Related Diarrhea   Codeine Nausea And Vomiting and Palpitations    Level of Care/Admitting Diagnosis ED Disposition     ED Disposition  Admit   Condition  --   Comment  Hospital Area: [facility] [[id number]]  Level of Care: ICU [6]  Covid Evaluation: Asymptomatic - no recent exposure (last 10 days) testing not required  Diagnosis: Hyperosmolar hyperglycemic state (HHS) [facility]) [[id number]]  Admitting Physician: TAT, DAVID Shoi-ming.Orion  Attending Physician: TAT, DAVID Shoi-ming.Orion          B Medical/Surgery History Past Medical History:  Diagnosis Date   Anxiety    CAD (coronary artery disease)    DES to circumflex 02/2007, BMS to LAD and PTCA diagonal 03/2007   Carotid artery plaque    Mild   Cataract    Depression    Diverticulitis, colon    Elevated d-dimer 01/08/2014   Essential hypertension, benign    GERD (gastroesophageal reflux  disease)    H/O hiatal hernia    HLD (hyperlipidemia)    IDDM (insulin dependent diabetes mellitus)    Migraine    "used to have them really bad; don't have them anymore" (01/07/2014)   MS (multiple sclerosis) (HCC)    Not confirmed   PAT (paroxysmal atrial tachycardia) (HCC)    Prolapse of uterus    PVD (peripheral vascular disease) (HCC)    TIA (transient ischemic attack) 1980's   Past Surgical History:  Procedure Laterality Date   ABDOMINAL HYSTERECTOMY  1986   ovaries remain - prolaspe uterus    APPENDECTOMY  ~ 1970   BREAST BIOPSY Right 1980's   BREAST LUMPECTOMY Right 1980's   Dr. Luan Moore    CARDIAC CATHETERIZATION  01/07/2014   CHOLECYSTECTOMY  ?1987   COLONOSCOPY  2002   Dr. Anwar--> Severe diverticular changes in the region of the sigmoid and descending colon with scattered diverticular changes throughout the rest of the colon. No polyps, ulcerations. Despite numerous manipulations, the tip of the scope could not be tipped into the cecal area.   COLONOSCOPY  01/10/2012   Procedure: COLONOSCOPY;  Surgeon: Corbin Ade, MD;  [facility];  Service: Endoscopy;  Laterality: N/A;  1:55   CORONARY ANGIOPLASTY WITH STENT PLACEMENT  ~ 1997 X 2   "2 + 1"   CORONARY ARTERY BYPASS GRAFT N/A 02/19/2023   Procedure: OFF  PUMP CORONARY ARTERY BYPASS GRAFTING (CABG) X 1;  Surgeon: Corliss Skains, MD;  Location: [facility] OR;  Service: Open Heart Surgery;  Laterality: N/A;  LIMA TO LAD   CORONARY BALLOON ANGIOPLASTY N/A 10/05/2022   Procedure: CORONARY BALLOON ANGIOPLASTY;  Surgeon: Tonny Bollman, MD;  Location: [facility] INVASIVE CV LAB;  Service: Cardiovascular;  Laterality: N/A;   CORONARY PRESSURE/FFR STUDY N/A 03/08/2017   Procedure: Intravascular Pressure Wire/FFR Study;  Surgeon: Yvonne Kendall, MD;  Location: [facility] INVASIVE CV LAB;  Service: Cardiovascular;  Laterality: N/A;   CORONARY STENT INTERVENTION N/A 03/26/2022   Procedure: CORONARY STENT INTERVENTION;  Surgeon: Runell Gess, MD;  Location: [facility] INVASIVE CV LAB;  Service: Cardiovascular;  Laterality: N/A;   EYE SURGERY Bilateral 2014   cataract   LEFT HEART CATH AND CORONARY ANGIOGRAPHY N/A 03/08/2017   Procedure: Left Heart Cath and Coronary Angiography;  Surgeon: Yvonne Kendall, MD;  Location: [facility] INVASIVE CV LAB;  Service: Cardiovascular;  Laterality: N/A;   LEFT HEART CATH AND CORONARY ANGIOGRAPHY N/A 03/26/2022   Procedure: LEFT HEART CATH AND CORONARY ANGIOGRAPHY;  Surgeon: Runell Gess, MD;  [facility]: [facility] INVASIVE CV LAB;  Service: Cardiovascular;  Laterality: N/A;   LEFT HEART CATH AND CORONARY ANGIOGRAPHY N/A 10/05/2022   Procedure: LEFT HEART CATH AND CORONARY ANGIOGRAPHY;  Surgeon: Tonny Bollman, MD;  Location: [facility] INVASIVE CV LAB;  Service: Cardiovascular;  Laterality: N/A;   LEFT HEART CATH AND CORONARY ANGIOGRAPHY N/A 02/14/2023   Procedure: LEFT HEART CATH AND CORONARY ANGIOGRAPHY;  Surgeon: Swaziland, Peter M, MD;  Location: [facility] INVASIVE CV LAB;  Service: Cardiovascular;  Laterality: N/A;   LEFT HEART CATHETERIZATION WITH CORONARY ANGIOGRAM N/A 01/07/2014   Procedure: LEFT HEART CATHETERIZATION WITH CORONARY ANGIOGRAM;  Surgeon: Laurey Morale, MD;  Location: [facility] CATH LAB;  Service: Cardiovascular;  Laterality: N/A;   TEE WITHOUT CARDIOVERSION N/A 02/19/2023   Procedure: TRANSESOPHAGEAL ECHOCARDIOGRAM;  Surgeon: Corliss Skains, MD;  Location: [facility] OR;  Service: Open Heart Surgery;  Laterality: N/A;     A IV Location/Drains/Wounds Patient Lines/Drains/Airways Status     Active Line/Drains/Airways     Name Placement date Placement time Site Days   Peripheral IV 11/28/23 20 G 1" Right Antecubital 11/28/23  1001  Antecubital  1   Peripheral IV 11/28/23 22 G 1" Anterior;Left;Proximal Forearm 11/28/23  1526  Forearm  1            Intake/Output Last 24 hours  Intake/Output Summary (Last 24 hours) at 11/29/2023 0015 Last data filed at 11/28/2023 1844 Gross per 24 hour  Intake 2041.12 ml   Output --  Net 2041.12 ml    Labs/Imaging Results for orders placed or performed during the hospital encounter of 11/28/23 (from the past 48 hours)  CBG monitoring, ED     Status: Abnormal   Collection Time: 11/28/23 10:09 AM  Result Value Ref Range   Glucose-Capillary 471 (H) 70 - 99 mg/dL    Comment: Glucose reference range applies only to samples taken after fasting for at least 8 hours.  CBC with Differential     Status: Abnormal   Collection Time: 11/28/23 10:20 AM  Result Value Ref Range   WBC 10.6 (H) 4.0 - 10.5 K/uL   RBC 4.57 3.87 - 5.11 MIL/uL   Hemoglobin 13.4 12.0 - 15.0 g/dL   HCT 62.1 30.8 - 65.7 %   MCV 88.2 80.0 - 100.0 fL   MCH 29.3 26.0 - 34.0 pg   MCHC 33.3 30.0 - 36.0  g/dL   RDW 16.1 09.6 - 04.5 %   Platelets 188 150 - 400 K/uL   nRBC 0.0 0.0 - 0.2 %   Neutrophils Relative % 88 %   Neutro Abs 9.3 (H) 1.7 - 7.7 K/uL   Lymphocytes Relative 5 %   Lymphs Abs 0.5 (L) 0.7 - 4.0 K/uL   Monocytes Relative 6 %   Monocytes Absolute 0.7 0.1 - 1.0 K/uL   Eosinophils Relative 0 %   Eosinophils Absolute 0.0 0.0 - 0.5 K/uL   Basophils Relative 0 %   Basophils Absolute 0.0 0.0 - 0.1 K/uL   Immature Granulocytes 1 %   Abs Immature Granulocytes 0.05 0.00 - 0.07 K/uL    Comment: Performed at [facility], [address], [place], [place] [address]  Basic metabolic panel     Status: Abnormal   Collection Time: 11/28/23 10:20 AM  Result Value Ref Range   Sodium 135 135 - 145 mmol/L   Potassium 3.5 3.5 - 5.1 mmol/L   Chloride 88 (L) 98 - 111 mmol/L   CO2 24 22 - 32 mmol/L   Glucose, Bld 460 (H) 70 - 99 mg/dL    Comment: Glucose reference range applies only to samples taken after fasting for at least 8 hours.   BUN 38 (H) 8 - 23 mg/dL   Creatinine, Ser 1.91 (H) 0.44 - 1.00 mg/dL   Calcium 9.6 8.9 - 47.8 mg/dL   GFR, Estimated 30 (L) >60 mL/min    Comment: (NOTE) Calculated using the CKD-EPI Creatinine Equation (2021)    Anion gap 23 (H) 5 - 15    Comment: Performed  at [facility], [address] [place], [place] [address]  Beta-hydroxybutyric acid     Status: Abnormal   Collection Time: 11/28/23 10:20 AM  Result Value Ref Range   Beta-Hydroxybutyric Acid 5.83 (H) 0.05 - 0.27 mmol/L    Comment: RESULTS CONFIRMED BY MANUAL DILUTION Performed at [facility], [address], [place], [place] [address]   CBG monitoring, ED     Status: Abnormal   Collection Time: 11/28/23  2:50 PM  Result Value Ref Range   Glucose-Capillary 546 (HH) 70 - 99 mg/dL    Comment: Glucose reference range applies only to samples taken after fasting for at least 8 hours.   Comment 1 Notify RN   Lactic acid, plasma     Status: None   Collection Time: 11/28/23  3:02 PM  Result Value Ref Range   Lactic Acid, Venous 1.5 0.5 - 1.9 mmol/L    Comment: Performed at [facility], [address], [place], [place] [address]  Procalcitonin     Status: None   Collection Time: 11/28/23  3:02 PM  Result Value Ref Range   Procalcitonin 0.11 ng/mL    Comment:        Interpretation: PCT (Procalcitonin) <= 0.5 ng/mL: Systemic infection (sepsis) is not likely. Local bacterial infection is possible. (NOTE)       Sepsis PCT Algorithm           Lower Respiratory Tract                                      Infection PCT Algorithm    ----------------------------     ----------------------------         PCT < 0.25 ng/mL  PCT < 0.10 ng/mL          Strongly encourage             Strongly discourage   discontinuation of antibiotics    initiation of antibiotics    ----------------------------     -----------------------------       PCT 0.25 - 0.50 ng/mL            PCT 0.10 - 0.25 ng/mL               OR       >80% decrease in PCT            Discourage initiation of                                            antibiotics      Encourage discontinuation           of antibiotics    ----------------------------     -----------------------------         PCT >= 0.50 ng/mL              PCT  0.26 - 0.50 ng/mL               AND        <80% decrease in PCT             Encourage initiation of                                             antibiotics       Encourage continuation           of antibiotics    ----------------------------     -----------------------------        PCT >= 0.50 ng/mL                  PCT > 0.50 ng/mL               AND         increase in PCT                  Strongly encourage                                      initiation of antibiotics    Strongly encourage escalation           of antibiotics                                     -----------------------------                                           PCT <= 0.25 ng/mL                                                 OR                                        >  80% decrease in PCT                                      Discontinue / Do not initiate                                             antibiotics  Performed at [facility], [address] [place], [place] [address]   Osmolality     Status: Abnormal   Collection Time: 11/28/23  3:02 PM  Result Value Ref Range   Osmolality 331 (HH) 275 - 295 mOsm/kg    Comment: REPEATED TO VERIFY CRITICAL RESULT CALLED TO, READ BACK BY AND VERIFIED WITH: BRANDY BAYNES RN @1817  ON 11/28/23 BY MAB Performed at [facility], [address] [place], [place] [address]   Hemoglobin A1c     Status: Abnormal   Collection Time: 11/28/23  3:02 PM  Result Value Ref Range   Hgb A1c MFr Bld 9.1 (H) 4.8 - 5.6 %    Comment: (NOTE) Pre diabetes:          5.7%-6.4%  Diabetes:              >6.4%  Glycemic control for   <7.0% adults with diabetes    Mean Plasma Glucose 214.47 mg/dL    Comment: Performed at [facility], [address] [place], [place] [address]  Blood gas, venous     Status: Abnormal   Collection Time: 11/28/23  3:02 PM  Result Value Ref Range   pH, Ven 7.43 7.25 - 7.43   pCO2, Ven 44 44 - 60 mmHg   pO2, Ven 48 (H) 32 - 45 mmHg    Bicarbonate 29.2 (H) 20.0 - 28.0 mmol/L   Acid-Base Excess 4.5 (H) 0.0 - 2.0 mmol/L   O2 Saturation 79.4 %   Patient temperature 37.4    Collection site BLOOD RIGHT HAND    Drawn by 1324     Comment: Performed at [facility], [address] [place], [place] [address]  Resp panel by RT-PCR (RSV, Flu A&B, Covid) Anterior Nasal Swab     Status: None   Collection Time: 11/28/23  3:05 PM   Specimen: Anterior Nasal Swab  Result Value Ref Range   SARS Coronavirus 2 by RT PCR NEGATIVE NEGATIVE    Comment: (NOTE) SARS-CoV-2 target nucleic acids are NOT DETECTED.  The SARS-CoV-2 RNA is generally detectable in upper respiratory specimens during the acute phase of infection. The lowest concentration of SARS-CoV-2 viral copies this assay can detect is 138 copies/mL. A negative result does not preclude SARS-Cov-2 infection and should not be used as the sole basis for treatment or other patient management decisions. A negative result may occur with  improper specimen collection/handling, submission of specimen other than nasopharyngeal swab, presence of viral mutation(s) within the areas targeted by this assay, and inadequate number of viral copies(<138 copies/mL). A negative result must be combined with clinical observations, patient history, and epidemiological information. The expected result is Negative.  Fact Sheet for Patients:  BloggerCourse.com  Fact Sheet for Healthcare Providers:  SeriousBroker.it  This test is no t yet approved or cleared by the [place] FDA and  has been authorized for detection and/or diagnosis of SARS-CoV-2 by FDA under an Emergency  Use Authorization (EUA). This EUA will remain  in effect (meaning this test can be used) for the duration of the COVID-19 declaration under Section 564(b)(1) of the Act, 21 U.S.C.section 360bbb-3(b)(1), unless the authorization is terminated  or revoked sooner.        Influenza A by PCR NEGATIVE NEGATIVE   Influenza B by PCR NEGATIVE NEGATIVE    Comment: (NOTE) The Xpert Xpress SARS-CoV-2/FLU/RSV plus assay is intended as an aid in the diagnosis of influenza from Nasopharyngeal swab specimens and should not be used as a sole basis for treatment. Nasal washings and aspirates are unacceptable for Xpert Xpress SARS-CoV-2/FLU/RSV testing.  Fact Sheet for Patients: BloggerCourse.com  Fact Sheet for Healthcare Providers: SeriousBroker.it  This test is not yet approved or cleared by the [place] FDA and has been authorized for detection and/or diagnosis of SARS-CoV-2 by FDA under an Emergency Use Authorization (EUA). This EUA will remain in effect (meaning this test can be used) for the duration of the COVID-19 declaration under Section 564(b)(1) of the Act, 21 U.S.C. section 360bbb-3(b)(1), unless the authorization is terminated or revoked.     Resp Syncytial Virus by PCR NEGATIVE NEGATIVE    Comment: (NOTE) Fact Sheet for Patients: BloggerCourse.com  Fact Sheet for Healthcare Providers: SeriousBroker.it  This test is not yet approved or cleared by the [place] FDA and has been authorized for detection and/or diagnosis of SARS-CoV-2 by FDA under an Emergency Use Authorization (EUA). This EUA will remain in effect (meaning this test can be used) for the duration of the COVID-19 declaration under Section 564(b)(1) of the Act, 21 U.S.C. section 360bbb-3(b)(1), unless the authorization is terminated or revoked.  Performed at [facility], [address], [place], [place] [address]   CBG monitoring, ED     Status: Abnormal   Collection Time: 11/28/23  3:37 PM  Result Value Ref Range   Glucose-Capillary 482 (H) 70 - 99 mg/dL    Comment: Glucose reference range applies only to samples taken after fasting for at least 8 hours.  CBG  monitoring, ED     Status: Abnormal   Collection Time: 11/28/23  4:18 PM  Result Value Ref Range   Glucose-Capillary 380 (H) 70 - 99 mg/dL    Comment: Glucose reference range applies only to samples taken after fasting for at least 8 hours.  CBG monitoring, ED     Status: Abnormal   Collection Time: 11/28/23  5:24 PM  Result Value Ref Range   Glucose-Capillary 276 (H) 70 - 99 mg/dL    Comment: Glucose reference range applies only to samples taken after fasting for at least 8 hours.  CBG monitoring, ED     Status: Abnormal   Collection Time: 11/28/23  6:31 PM  Result Value Ref Range   Glucose-Capillary 202 (H) 70 - 99 mg/dL    Comment: Glucose reference range applies only to samples taken after fasting for at least 8 hours.  Lactic acid, plasma     Status: None   Collection Time: 11/28/23  6:37 PM  Result Value Ref Range   Lactic Acid, Venous 1.6 0.5 - 1.9 mmol/L    Comment: Performed at [facility], [address] [place], [place] [address]  Basic metabolic panel     Status: Abnormal   Collection Time: 11/28/23  6:37 PM  Result Value Ref Range   Sodium 134 (L) 135 - 145 mmol/L   Potassium 2.9 (L) 3.5 - 5.1 mmol/L   Chloride 95 (L) 98 -  111 mmol/L   CO2 27 22 - 32 mmol/L   Glucose, Bld 225 (H) 70 - 99 mg/dL    Comment: Glucose reference range applies only to samples taken after fasting for at least 8 hours.   BUN 37 (H) 8 - 23 mg/dL   Creatinine, Ser 1.61 (H) 0.44 - 1.00 mg/dL   Calcium 8.5 (L) 8.9 - 10.3 mg/dL   GFR, Estimated 38 (L) >60 mL/min    Comment: (NOTE) Calculated using the CKD-EPI Creatinine Equation (2021)    Anion gap 12 5 - 15    Comment: Performed at [facility], [address] [place], [place] [address]  CBG monitoring, ED     Status: Abnormal   Collection Time: 11/28/23  7:38 PM  Result Value Ref Range   Glucose-Capillary 238 (H) 70 - 99 mg/dL    Comment: Glucose reference range applies only to samples taken after fasting for at least 8 hours.   Urinalysis, w/ Reflex to Culture (Infection Suspected) -Urine, Clean Catch     Status: Abnormal   Collection Time: 11/28/23  8:50 PM  Result Value Ref Range   Specimen Source URINE, CLEAN CATCH    Color, Urine YELLOW YELLOW   APPearance CLEAR CLEAR   Specific Gravity, Urine 1.027 1.005 - 1.030   pH 5.0 5.0 - 8.0   Glucose, UA >=500 (A) NEGATIVE mg/dL   Hgb urine dipstick LARGE (A) NEGATIVE   Bilirubin Urine NEGATIVE NEGATIVE   Ketones, ur 20 (A) NEGATIVE mg/dL   Protein, ur 540 (A) NEGATIVE mg/dL   Nitrite NEGATIVE NEGATIVE   Leukocytes,Ua NEGATIVE NEGATIVE   RBC / HPF 0-5 0 - 5 RBC/hpf   WBC, UA 0-5 0 - 5 WBC/hpf    Comment:        Reflex urine culture not performed if WBC <=10, OR if Squamous epithelial cells >5. If Squamous epithelial cells >5 suggest recollection.    Bacteria, UA NONE SEEN NONE SEEN   Squamous Epithelial / HPF 6-10 0 - 5 /HPF    Comment: Performed at [facility], [address], [place], [place] [address]  Basic metabolic panel     Status: Abnormal   Collection Time: 11/28/23 11:01 PM  Result Value Ref Range   Sodium 134 (L) 135 - 145 mmol/L   Potassium 3.5 3.5 - 5.1 mmol/L   Chloride 96 (L) 98 - 111 mmol/L   CO2 24 22 - 32 mmol/L   Glucose, Bld 275 (H) 70 - 99 mg/dL    Comment: Glucose reference range applies only to samples taken after fasting for at least 8 hours.   BUN 36 (H) 8 - 23 mg/dL   Creatinine, Ser 1.47 (H) 0.44 - 1.00 mg/dL   Calcium 8.4 (L) 8.9 - 10.3 mg/dL   GFR, Estimated 41 (L) >60 mL/min    Comment: (NOTE) Calculated using the CKD-EPI Creatinine Equation (2021)    Anion gap 14 5 - 15    Comment: Performed at [facility], [address] [place], [place] [address]   MR BRAIN WO CONTRAST Result Date: 11/28/2023 CLINICAL DATA:  Headache, neuro deficit EXAM: MRI HEAD WITHOUT CONTRAST TECHNIQUE: Multiplanar, multiecho pulse sequences of the brain and surrounding structures were obtained without intravenous contrast. COMPARISON:  CT head  from today.  MRI head from October 26, 24. FINDINGS: Brain: No acute infarction, hemorrhage, hydrocephalus, extra-axial collection or mass lesion. Vascular: Major arterial flow voids are maintained at the skull base. Skull and upper cervical spine: Normal marrow signal.  Sinuses/Orbits: Clear sinuses.  No acute orbital findings. Other: No mastoid effusions. IMPRESSION: No evidence of acute intracranial abnormality. Electronically Signed   By: Feliberto Harts M.D.   On: 11/28/2023 23:38   DG CHEST PORT 1 VIEW Result Date: 11/28/2023 CLINICAL DATA:  Fever. Left facial droop. Headache and dizziness for the past 3 days. EXAM: PORTABLE CHEST 1 VIEW COMPARISON:  04/02/2023 FINDINGS: Normal sized heart. Tortuous and partially calcified thoracic aorta. Clear lungs with normal vascularity. Stable median sternotomy wires. Diffuse osteopenia. IMPRESSION: No acute abnormality. Electronically Signed   By: Beckie Salts M.D.   On: 11/28/2023 17:10   CT Head Wo Contrast Result Date: 11/28/2023 CLINICAL DATA:  73 year old female with headache, dizziness, left facial droop. EXAM: CT HEAD WITHOUT CONTRAST TECHNIQUE: Contiguous axial images were obtained from the base of the skull through the vertex without intravenous contrast. RADIATION DOSE REDUCTION: This exam was performed according to the departmental dose-optimization program which includes automated exposure control, adjustment of the mA and/or kV according to patient size and/or use of iterative reconstruction technique. COMPARISON:  Brain MRI 07/20/2023.  Head CT 07/13/2023. FINDINGS: Brain: Stable cerebral volume. No midline shift, ventriculomegaly, mass effect, evidence of mass lesion, intracranial hemorrhage or evidence of cortically based acute infarction. Stable gray-white matter differentiation throughout the brain. Mild for age cerebral white matter changes better demonstrated by MRI. Vascular: Calcified atherosclerosis at the skull base. No suspicious  intracranial vascular hyperdensity. Skull: Stable, intact. Sinuses/Orbits: Visualized paranasal sinuses and mastoids are stable and well aerated. Other: No acute orbit or scalp soft tissue finding. IMPRESSION: No acute intracranial abnormality. Stable non contrast CT appearance of the brain. Electronically Signed   By: Odessa Fleming M.D.   On: 11/28/2023 12:10    Pending Labs Unresulted Labs (From admission, onward)     Start     Ordered   11/29/23 0500  CBC  Tomorrow morning,   R        11/28/23 2254   11/29/23 0500  Magnesium  Tomorrow morning,   R        11/28/23 2254   11/28/23 1900  Basic metabolic panel  (Hyperglycemic Hyperosmolar State (HHS))  STAT Now then every 4 hours ,   R (with STAT occurrences)      11/28/23 1439   11/28/23 1437  Culture, blood (Routine X 2) w Reflex to ID Panel  BLOOD CULTURE X 2,   R (with TIMED occurrences)      11/28/23 1438            Vitals/Pain Today's Vitals   11/28/23 2030 11/28/23 2200 11/28/23 2229 11/28/23 2230  BP: (!) 139/53 (!) 146/50  (!) 139/51  Pulse: 82 79  79  Resp: 20 16  15   Temp:   98.4 F (36.9 C)   TempSrc:   Oral   SpO2: 94% 94%  94%  Weight:      Height:      PainSc:        Isolation Precautions Airborne and Contact precautions  Medications Medications  insulin regular, human (MYXREDLIN) 100 units/ 100 mL infusion (9 Units/hr Intravenous New Bag/Given 11/29/23 0011)  lactated ringers infusion ( Intravenous New Bag/Given 11/28/23 2219)  dextrose 5 % in lactated ringers infusion (0 mLs Intravenous Hold 11/28/23 2015)  dextrose 50 % solution 0-50 mL (has no administration in time range)  aspirin chewable tablet 81 mg (has no administration in time range)  metoprolol tartrate (LOPRESSOR) tablet 50 mg (50 mg Oral Given 11/28/23 2302)  rosuvastatin (CRESTOR) tablet 20 mg (has no administration in time range)  clopidogrel (PLAVIX) tablet 75 mg (has no administration in time range)  traZODone (DESYREL) tablet 50 mg (has no  administration in time range)  sertraline (ZOLOFT) tablet 50 mg (has no administration in time range)  rOPINIRole (REQUIP) tablet 1 mg (has no administration in time range)  heparin injection 5,000 Units (5,000 Units Subcutaneous Given 11/28/23 2303)  acetaminophen (TYLENOL) tablet 650 mg (has no administration in time range)    Or  acetaminophen (TYLENOL) suppository 650 mg (has no administration in time range)  prochlorperazine (COMPAZINE) injection 10 mg (has no administration in time range)  hydrALAZINE (APRESOLINE) injection 10 mg (has no administration in time range)  potassium chloride 10 mEq in 100 mL IVPB (10 mEq Intravenous New Bag/Given 11/28/23 2336)  potassium chloride 10 mEq in 100 mL IVPB (has no administration in time range)  ketorolac (TORADOL) 30 MG/ML injection 15 mg (15 mg Intravenous Given 11/28/23 1117)  metoCLOPramide (REGLAN) injection 10 mg (10 mg Intravenous Given 11/28/23 1209)  labetalol (NORMODYNE) injection 20 mg (20 mg Intravenous Given 11/28/23 1227)  oxyCODONE-acetaminophen (PERCOCET/ROXICET) 5-325 MG per tablet 1 tablet (1 tablet Oral Given 11/28/23 1225)  sodium chloride 0.9 % bolus 1,000 mL (0 mLs Intravenous Stopped 11/28/23 1607)  labetalol (NORMODYNE) injection 40 mg (40 mg Intravenous Given 11/28/23 1409)  lactated ringers bolus 1,412 mL (1,412 mLs Intravenous New Bag/Given 11/28/23 1619)  potassium chloride 10 mEq in 100 mL IVPB (0 mEq Intravenous Stopped 11/28/23 1718)    Mobility walks     Focused Assessments Neuro Assessment Handoff:  Swallow screen pass? Yes  Cardiac Rhythm: Sinus tachycardia NIH Stroke Scale  Dizziness Present: No Headache Present: No Interval: Shift assessment Level of Consciousness (1a.)   : Alert, keenly responsive LOC Questions (1b. )   : Answers both questions correctly LOC Commands (1c. )   : Performs both tasks correctly Best Gaze (2. )  : Normal Visual (3. )  : No visual loss Facial Palsy (4. )    : Normal symmetrical  movements Motor Arm, Left (5a. )   : No drift Motor Arm, Right (5b. ) : No drift Motor Leg, Left (6a. )  : No drift Motor Leg, Right (6b. ) : No drift Limb Ataxia (7. ): Absent Sensory (8. )  : Normal, no sensory loss Best Language (9. )  : No aphasia Dysarthria (10. ): Normal Extinction/Inattention (11.)   : No Abnormality Complete NIHSS TOTAL: 0 Last date known well: 11/25/23 Last time known well: 2000 Neuro Assessment: Within Defined Limits Neuro Checks:   Initial (11/28/23 1017)  Has TPA been given? No If patient is a Neuro Trauma and patient is going to OR before floor call report to 4N Charge nurse: [phone] or [phone]   R Recommendations: See Admitting Provider Note  Report given to:   Additional Notes: Alert and oriented, ambulatory. On insulin drip and potassium drip.

## 2023-11-29 NOTE — Discharge Summary (Addendum)
 Physician Discharge Summary   Patient: [name] MRN: [id number] DOB: [birth year]  Admit date:     11/28/2023  Discharge date: 11/29/23  Discharge Physician: Onalee Hua Elasia Furnish   PCP: Gabriel Earing, FNP   Recommendations at discharge:   Please follow up with primary care provider within 1-2 weeks  Please repeat BMP and CBC in one week     Hospital Course: 73 year old female with a history of coronary artery disease status post CABG 02/19/23, hypertension, hyperlipidemia, diabetes mellitus type 2, TIA, depression, atrial tachycardia presenting with generalized weakness, dizziness, and headache that began on 11/24/2023.  The patient's son is at the bedside to help supplement the history.  He states that the patient had been in her usual state of health on 11/23/2023 when they went out to celebrate her birthday.  The patient stated that she began developing a headache and some dizziness and some nausea on 11/24/2023, and the symptoms have progressed to the point where she is having at least 3 episodes of nausea and vomiting on the day of admission.  She denies any hematemesis.  She stated she had a fever 102.0 F on 11/27/2023.  She denies any hematemesis, diarrhea, hematochezia, melena.  She denies any chest pain, shortness breath, hemoptysis, dysuria, hematuria. She denies any visual disturbance.  She denies any focal extremity weakness.  She denies any facial droop or dysesthesias.  She denies any word finding difficulties or dysarthria.  She states that her generalized weakness has gradually progressed.  She denies any new medications. In the ED, the patient had low-grade temperature overnight 9.3 F.  She was tachycardic into the 120s.  Her blood pressure was elevated up to 201/75.  Oxygen saturation was 94-97% on room air.  WBC 10.6, hemoglobin 13.4, platelets 188.  Sodium 135, potassium 3.5, bicarbonate 24, serum creatinine 1.76.  Serum glucose was 460.  Anion gap 23.  EKG showed sinus rhythm with no ST ST  wave changes.  CT of the brain was negative for any acute findings.  Patient was admitted for further evaluation and treatment of her generalized weakness and fever.  Assessment and Plan: Hyperosmolar nonketotic state -patient started on IV insulin with q 1 hour CBG check and q 4 hour BMPs -pt started on aggressive fluid resuscitation -Electrolytes were monitored and repleted -transitioned to [facility] insulin once anion gap closed -diet was advanced once anion gap closed -10/09/2023 HbA1C--7.4 -11/28/23 A1C--9.1   Fever -resolved -no fever since admission -Blood cultures x 2 sets--neg to date -PCT--0.11 -Chest x-ray--personally reviewed--neg -check COVID-19 and Flu/RSV -UA and culture--neg for pyuria   Acute on chronic renal failure--CKD stage IIIb -Baseline creatinine 1.1-1.3 -Presented with serum creatinine 1.76 -Secondary to volume depletion -improved with IVF>>serum creatinine 1.09 on day of dc -hold lisinopril>>restart after dc   Uncontrolled diabetes mellitus type 2 with hyperglycemia -Recheck hemoglobin A1c--9.1 on 11/28/23 -pt has freestyle libre system--educated pt to keep close track of sugars and follow up with PCP for adjustment of her home regimen   Essential hypertension/HTN urgency -Restart metoprolol>>increase home dose to 25 mg bid -restart lisinopril after dc -prn hydralazine   Coronary artery disease -Status post CABG 02/19/2023 -No chest pain presently -Continue aspirin and Plavix   Depression -Continue sertraline   Mixed hyperlipidemia -Continue statin         Consultants: none Procedures performed: none  Disposition: Home Diet recommendation:  Carb modified diet DISCHARGE MEDICATION: Allergies as of 11/29/2023       Reactions   Iohexol  Desc: pt had syncopal episode with nausea post IV CM late 1990's,  pt has had prednisone prep with heart caths x 2 without problem  kdean 04/16/07, Onset Date: [id number]   Ticlid [ticlopidine Hcl] Nausea And  Vomiting   Jardiance [empagliflozin] Other (See Comments)   Recurrent UTIs   Metformin And Related Diarrhea   Codeine Nausea And Vomiting, Palpitations        Medication List     TAKE these medications    aspirin 81 MG chewable tablet Chew 1 tablet (81 mg total) by mouth daily.   aspirin-acetaminophen-caffeine 250-250-65 MG tablet Commonly known as: EXCEDRIN MIGRAINE Take 1 tablet by mouth every 6 (six) hours as needed for headache.   Blood Glucose Monitoring Suppl Devi 1 each by Does not apply route in the morning, at noon, and at bedtime. May substitute to any manufacturer covered by patient's insurance.   clopidogrel 75 MG tablet Commonly known as: PLAVIX Take 1 tablet (75 mg total) by mouth daily with breakfast.   diphenhydrAMINE 25 mg capsule Commonly known as: BENADRYL Take 25 mg by mouth every 6 (six) hours as needed for allergies.   FreeStyle Libre 3 Sensor Misc PLACE 1 SENSOR ON THE SKIN EVERY 14 DAYS   furosemide 20 MG tablet Commonly known as: LASIX Take 1 tablet (20 mg total) by mouth daily.   insulin degludec 200 UNIT/ML FlexTouch Pen Commonly known as: TRESIBA Inject 30-60 Units into the skin daily. Pt states she takes 60 units if she eats and 30 units if she does not eat   levocetirizine 5 MG tablet Commonly known as: XYZAL Take 0.5 tablets (2.5 mg total) by mouth every evening.   lisinopril 10 MG tablet Commonly known as: ZESTRIL Take 1 tablet (10 mg total) by mouth daily.   metoprolol tartrate 25 MG tablet Commonly known as: LOPRESSOR Take 2 tablets (50 mg total) by mouth 2 (two) times daily. What changed: how much to take   nitroGLYCERIN 0.4 MG SL tablet Commonly known as: NITROSTAT DISSOLVE 1 TABLET UNDER TONGUE FOR CHESTPAIN.MAY REPEAT EVERY 5 MINUTES FOR 3 DOSES.IF NO RELIEF CALL 911 OR GO TO ER   NovoLOG FlexPen 100 UNIT/ML FlexPen Generic drug: insulin aspart Inject 36 Units into the skin 3 (three) times daily with meals. What  changed: how much to take   potassium chloride 10 MEQ tablet Commonly known as: KLOR-CON TAKE ONE (1) TABLET BY MOUTH TWO (2) TIMES DAILY   rOPINIRole 0.5 MG tablet Commonly known as: Requip Take 2 tablets (1 mg total) by mouth daily as needed (restless leg).   rosuvastatin 20 MG tablet Commonly known as: CRESTOR TAKE ONE (1) TABLET BY MOUTH EVERY DAY   Semaglutide (2 MG/DOSE) 8 MG/3ML Sopn Inject 2 mg as directed once a week.   sertraline 50 MG tablet Commonly known as: ZOLOFT Take 1 tablet (50 mg total) by mouth daily.   traZODone 50 MG tablet Commonly known as: DESYREL Take 1 tablet (50 mg total) by mouth at bedtime as needed for sleep.        Discharge Exam: Filed Weights   11/28/23 1009  Weight: 70.6 kg   HEENT:  [place]/AT, No thrush, no icterus CV:  RRR, no rub, no S3, no S4 Lung:  CTA, no wheeze, no rhonchi Abd:  soft/+BS, NT Ext:  No edema, no lymphangitis, no synovitis, no rash   Condition at discharge: stable  The results of significant diagnostics from this hospitalization (including imaging, microbiology, ancillary and laboratory) are listed  below for reference.   Imaging Studies: MR BRAIN WO CONTRAST Result Date: 11/28/2023 CLINICAL DATA:  Headache, neuro deficit EXAM: MRI HEAD WITHOUT CONTRAST TECHNIQUE: Multiplanar, multiecho pulse sequences of the brain and surrounding structures were obtained without intravenous contrast. COMPARISON:  CT head from today.  MRI head from October 26, 24. FINDINGS: Brain: No acute infarction, hemorrhage, hydrocephalus, extra-axial collection or mass lesion. Vascular: Major arterial flow voids are maintained at the skull base. Skull and upper cervical spine: Normal marrow signal. Sinuses/Orbits: Clear sinuses.  No acute orbital findings. Other: No mastoid effusions. IMPRESSION: No evidence of acute intracranial abnormality. Electronically Signed   By: Feliberto Harts M.D.   On: 11/28/2023 23:38   DG CHEST PORT 1 VIEW Result  Date: 11/28/2023 CLINICAL DATA:  Fever. Left facial droop. Headache and dizziness for the past 3 days. EXAM: PORTABLE CHEST 1 VIEW COMPARISON:  04/02/2023 FINDINGS: Normal sized heart. Tortuous and partially calcified thoracic aorta. Clear lungs with normal vascularity. Stable median sternotomy wires. Diffuse osteopenia. IMPRESSION: No acute abnormality. Electronically Signed   By: Beckie Salts M.D.   On: 11/28/2023 17:10   CT Head Wo Contrast Result Date: 11/28/2023 CLINICAL DATA:  73 year old female with headache, dizziness, left facial droop. EXAM: CT HEAD WITHOUT CONTRAST TECHNIQUE: Contiguous axial images were obtained from the base of the skull through the vertex without intravenous contrast. RADIATION DOSE REDUCTION: This exam was performed according to the departmental dose-optimization program which includes automated exposure control, adjustment of the mA and/or kV according to patient size and/or use of iterative reconstruction technique. COMPARISON:  Brain MRI 07/20/2023.  Head CT 07/13/2023. FINDINGS: Brain: Stable cerebral volume. No midline shift, ventriculomegaly, mass effect, evidence of mass lesion, intracranial hemorrhage or evidence of cortically based acute infarction. Stable gray-white matter differentiation throughout the brain. Mild for age cerebral white matter changes better demonstrated by MRI. Vascular: Calcified atherosclerosis at the skull base. No suspicious intracranial vascular hyperdensity. Skull: Stable, intact. Sinuses/Orbits: Visualized paranasal sinuses and mastoids are stable and well aerated. Other: No acute orbit or scalp soft tissue finding. IMPRESSION: No acute intracranial abnormality. Stable non contrast CT appearance of the brain. Electronically Signed   By: Odessa Fleming M.D.   On: 11/28/2023 12:10    Microbiology: Results for orders placed or performed during the hospital encounter of 11/28/23  Culture, blood (Routine X 2) w Reflex to ID Panel     Status: None  (Preliminary result)   Collection Time: 11/28/23  3:02 PM   Specimen: BLOOD  Result Value Ref Range Status   Specimen Description BLOOD BLOOD RIGHT HAND  Final   Special Requests   Final    BOTTLES DRAWN AEROBIC ONLY Blood Culture results may not be optimal due to an inadequate volume of blood received in culture bottles   Culture   Final    NO GROWTH < 24 HOURS Performed at [facility], [address], [place], [place] [address]    Report Status PENDING  Incomplete  Culture, blood (Routine X 2) w Reflex to ID Panel     Status: None (Preliminary result)   Collection Time: 11/28/23  3:03 PM   Specimen: BLOOD  Result Value Ref Range Status   Specimen Description BLOOD BLOOD LEFT HAND  Final   Special Requests   Final    BOTTLES DRAWN AEROBIC AND ANAEROBIC Blood Culture results may not be optimal due to an inadequate volume of blood received in culture bottles   Culture   Final    NO  GROWTH < 24 HOURS Performed at [facility], [address] [place], [place] [address]    Report Status PENDING  Incomplete  Resp panel by RT-PCR (RSV, Flu A&B, Covid) Anterior Nasal Swab     Status: None   Collection Time: 11/28/23  3:05 PM   Specimen: Anterior Nasal Swab  Result Value Ref Range Status   SARS Coronavirus 2 by RT PCR NEGATIVE NEGATIVE Final    Comment: (NOTE) SARS-CoV-2 target nucleic acids are NOT DETECTED.  The SARS-CoV-2 RNA is generally detectable in upper respiratory specimens during the acute phase of infection. The lowest concentration of SARS-CoV-2 viral copies this assay can detect is 138 copies/mL. A negative result does not preclude SARS-Cov-2 infection and should not be used as the sole basis for treatment or other patient management decisions. A negative result may occur with  improper specimen collection/handling, submission of specimen other than nasopharyngeal swab, presence of viral mutation(s) within the areas targeted by this assay, and inadequate number of  viral copies(<138 copies/mL). A negative result must be combined with clinical observations, patient history, and epidemiological information. The expected result is Negative.  Fact Sheet for Patients:  BloggerCourse.com  Fact Sheet for Healthcare Providers:  SeriousBroker.it  This test is no t yet approved or cleared by the [place] FDA and  has been authorized for detection and/or diagnosis of SARS-CoV-2 by FDA under an Emergency Use Authorization (EUA). This EUA will remain  in effect (meaning this test can be used) for the duration of the COVID-19 declaration under Section 564(b)(1) of the Act, 21 U.S.C.section 360bbb-3(b)(1), unless the authorization is terminated  or revoked sooner.       Influenza A by PCR NEGATIVE NEGATIVE Final   Influenza B by PCR NEGATIVE NEGATIVE Final    Comment: (NOTE) The Xpert Xpress SARS-CoV-2/FLU/RSV plus assay is intended as an aid in the diagnosis of influenza from Nasopharyngeal swab specimens and should not be used as a sole basis for treatment. Nasal washings and aspirates are unacceptable for Xpert Xpress SARS-CoV-2/FLU/RSV testing.  Fact Sheet for Patients: BloggerCourse.com  Fact Sheet for Healthcare Providers: SeriousBroker.it  This test is not yet approved or cleared by the [place] FDA and has been authorized for detection and/or diagnosis of SARS-CoV-2 by FDA under an Emergency Use Authorization (EUA). This EUA will remain in effect (meaning this test can be used) for the duration of the COVID-19 declaration under Section 564(b)(1) of the Act, 21 U.S.C. section 360bbb-3(b)(1), unless the authorization is terminated or revoked.     Resp Syncytial Virus by PCR NEGATIVE NEGATIVE Final    Comment: (NOTE) Fact Sheet for Patients: BloggerCourse.com  Fact Sheet for Healthcare  Providers: SeriousBroker.it  This test is not yet approved or cleared by the [place] FDA and has been authorized for detection and/or diagnosis of SARS-CoV-2 by FDA under an Emergency Use Authorization (EUA). This EUA will remain in effect (meaning this test can be used) for the duration of the COVID-19 declaration under Section 564(b)(1) of the Act, 21 U.S.C. section 360bbb-3(b)(1), unless the authorization is terminated or revoked.  Performed at [facility], [address], [place], [place] [address]   MRSA Next Gen by PCR, Nasal     Status: None   Collection Time: 11/29/23  1:30 AM   Specimen: Nasal Mucosa; Nasal Swab  Result Value Ref Range Status   MRSA by PCR Next Gen NOT DETECTED NOT DETECTED Final    Comment: (NOTE) The GeneXpert MRSA Assay (FDA approved for  NASAL specimens only), is one component of a comprehensive MRSA colonization surveillance program. It is not intended to diagnose MRSA infection nor to guide or monitor treatment for MRSA infections. Test performance is not FDA approved in patients less than 89 years old. Performed at [facility], [address], [place], [place] [address]     Labs: CBC: Recent Labs  Lab 11/28/23 1020 11/29/23 0405  WBC 10.6* 9.8  NEUTROABS 9.3*  --   HGB 13.4 11.4*  HCT 40.3 33.9*  MCV 88.2 89.0  PLT 188 148*   Basic Metabolic Panel: Recent Labs  Lab 11/28/23 1837 11/28/23 2301 11/29/23 0405 11/29/23 0807 11/29/23 1229  NA 134* 134* 135 136 138  K 2.9* 3.5 3.4* 3.3* 3.3*  CL 95* 96* 100 99 100  CO2 27 24 26 28 28   GLUCOSE 225* 275* 169* 209* 118*  BUN 37* 36* 33* 28* 25*  CREATININE 1.44* 1.35* 1.18* 1.13* 1.09*  CALCIUM 8.5* 8.4* 8.6* 8.7* 8.8*  MG  --   --  1.9  --   --    Liver Function Tests: No results for input(s): "AST", "ALT", "ALKPHOS", "BILITOT", "PROT", "ALBUMIN" in the last 168 hours. CBG: Recent Labs  Lab 11/29/23 0742 11/29/23 0859 11/29/23 1009 11/29/23 1121  11/29/23 1229  GLUCAP 183* 193* 189* 134* 111*    Discharge time spent: greater than 30 minutes.  Signed: Catarina Hartshorn, MD [facility] 11/29/2023

## 2023-11-29 NOTE — Plan of Care (Signed)
 Problem: Education: Goal: Ability to describe self-care measures that may prevent or decrease complications (Diabetes Survival Skills Education) will improve Outcome: Adequate for Discharge Goal: Individualized Educational Video(s) Outcome: Adequate for Discharge   Problem: Coping: Goal: Ability to adjust to condition or change in health will improve Outcome: Adequate for Discharge   Problem: Fluid Volume: Goal: Ability to maintain a balanced intake and output will improve Outcome: Adequate for Discharge   Problem: Health Behavior/Discharge Planning: Goal: Ability to identify and utilize available resources and services will improve Outcome: Adequate for Discharge Goal: Ability to manage health-related needs will improve Outcome: Adequate for Discharge   Problem: Metabolic: Goal: Ability to maintain appropriate glucose levels will improve Outcome: Adequate for Discharge   Problem: Nutritional: Goal: Maintenance of adequate nutrition will improve Outcome: Adequate for Discharge Goal: Progress toward achieving an optimal weight will improve Outcome: Adequate for Discharge   Problem: Skin Integrity: Goal: Risk for impaired skin integrity will decrease Outcome: Adequate for Discharge   Problem: Tissue Perfusion: Goal: Adequacy of tissue perfusion will improve Outcome: Adequate for Discharge   Problem: Education: Goal: Ability to describe self-care measures that may prevent or decrease complications (Diabetes Survival Skills Education) will improve Outcome: Adequate for Discharge Goal: Individualized Educational Video(s) Outcome: Adequate for Discharge   Problem: Cardiac: Goal: Ability to maintain an adequate cardiac output will improve Outcome: Adequate for Discharge   Problem: Health Behavior/Discharge Planning: Goal: Ability to identify and utilize available resources and services will improve Outcome: Adequate for Discharge Goal: Ability to manage health-related  needs will improve Outcome: Adequate for Discharge   Problem: Fluid Volume: Goal: Ability to achieve a balanced intake and output will improve Outcome: Adequate for Discharge   Problem: Metabolic: Goal: Ability to maintain appropriate glucose levels will improve Outcome: Adequate for Discharge   Problem: Nutritional: Goal: Maintenance of adequate nutrition will improve Outcome: Adequate for Discharge Goal: Maintenance of adequate weight for body size and type will improve Outcome: Adequate for Discharge   Problem: Respiratory: Goal: Will regain and/or maintain adequate ventilation Outcome: Adequate for Discharge   Problem: Urinary Elimination: Goal: Ability to achieve and maintain adequate renal perfusion and functioning will improve Outcome: Adequate for Discharge   Problem: Education: Goal: Knowledge of General Education information will improve Description: Including pain rating scale, medication(s)/side effects and non-pharmacologic comfort measures Outcome: Adequate for Discharge   Problem: Health Behavior/Discharge Planning: Goal: Ability to manage health-related needs will improve Outcome: Adequate for Discharge   Problem: Clinical Measurements: Goal: Ability to maintain clinical measurements within normal limits will improve Outcome: Adequate for Discharge Goal: Will remain free from infection Outcome: Adequate for Discharge Goal: Diagnostic test results will improve Outcome: Adequate for Discharge Goal: Respiratory complications will improve Outcome: Adequate for Discharge Goal: Cardiovascular complication will be avoided Outcome: Adequate for Discharge   Problem: Activity: Goal: Risk for activity intolerance will decrease Outcome: Adequate for Discharge   Problem: Nutrition: Goal: Adequate nutrition will be maintained Outcome: Adequate for Discharge   Problem: Coping: Goal: Level of anxiety will decrease Outcome: Adequate for Discharge   Problem:  Elimination: Goal: Will not experience complications related to bowel motility Outcome: Adequate for Discharge Goal: Will not experience complications related to urinary retention Outcome: Adequate for Discharge   Problem: Pain Managment: Goal: General experience of comfort will improve and/or be controlled Outcome: Adequate for Discharge   Problem: Safety: Goal: Ability to remain free from injury will improve Outcome: Adequate for Discharge   Problem: Skin Integrity: Goal: Risk for impaired skin  integrity will decrease Outcome: Adequate for Discharge

## 2023-12-03 LAB — CULTURE, BLOOD (ROUTINE X 2)
Culture: NO GROWTH
Culture: NO GROWTH

## 2023-12-24 DIAGNOSIS — I781 Nevus, non-neoplastic: Secondary | ICD-10-CM | POA: Diagnosis not present

## 2023-12-24 DIAGNOSIS — L821 Other seborrheic keratosis: Secondary | ICD-10-CM | POA: Diagnosis not present

## 2023-12-24 DIAGNOSIS — L570 Actinic keratosis: Secondary | ICD-10-CM | POA: Diagnosis not present

## 2024-01-01 ENCOUNTER — Ambulatory Visit: Payer: PPO | Admitting: Cardiology

## 2024-01-04 DIAGNOSIS — M50222 Other cervical disc displacement at C5-C6 level: Secondary | ICD-10-CM | POA: Diagnosis not present

## 2024-01-04 DIAGNOSIS — R079 Chest pain, unspecified: Secondary | ICD-10-CM | POA: Diagnosis not present

## 2024-01-04 DIAGNOSIS — Z885 Allergy status to narcotic agent status: Secondary | ICD-10-CM | POA: Diagnosis not present

## 2024-01-04 DIAGNOSIS — Z7983 Long term (current) use of bisphosphonates: Secondary | ICD-10-CM | POA: Diagnosis not present

## 2024-01-04 DIAGNOSIS — R072 Precordial pain: Secondary | ICD-10-CM | POA: Diagnosis not present

## 2024-01-04 DIAGNOSIS — R739 Hyperglycemia, unspecified: Secondary | ICD-10-CM | POA: Diagnosis not present

## 2024-01-04 DIAGNOSIS — R519 Headache, unspecified: Secondary | ICD-10-CM | POA: Diagnosis not present

## 2024-01-04 DIAGNOSIS — K5790 Diverticulosis of intestine, part unspecified, without perforation or abscess without bleeding: Secondary | ICD-10-CM | POA: Diagnosis not present

## 2024-01-04 DIAGNOSIS — S0003XA Contusion of scalp, initial encounter: Secondary | ICD-10-CM | POA: Diagnosis not present

## 2024-01-04 DIAGNOSIS — W1839XA Other fall on same level, initial encounter: Secondary | ICD-10-CM | POA: Diagnosis not present

## 2024-01-04 DIAGNOSIS — S299XXA Unspecified injury of thorax, initial encounter: Secondary | ICD-10-CM | POA: Diagnosis not present

## 2024-01-04 DIAGNOSIS — Z79899 Other long term (current) drug therapy: Secondary | ICD-10-CM | POA: Diagnosis not present

## 2024-01-04 DIAGNOSIS — S3991XA Unspecified injury of abdomen, initial encounter: Secondary | ICD-10-CM | POA: Diagnosis not present

## 2024-01-04 DIAGNOSIS — M47812 Spondylosis without myelopathy or radiculopathy, cervical region: Secondary | ICD-10-CM | POA: Diagnosis not present

## 2024-01-04 DIAGNOSIS — K862 Cyst of pancreas: Secondary | ICD-10-CM | POA: Diagnosis not present

## 2024-01-04 DIAGNOSIS — Z7982 Long term (current) use of aspirin: Secondary | ICD-10-CM | POA: Diagnosis not present

## 2024-01-04 DIAGNOSIS — Z91041 Radiographic dye allergy status: Secondary | ICD-10-CM | POA: Diagnosis not present

## 2024-01-04 DIAGNOSIS — Z043 Encounter for examination and observation following other accident: Secondary | ICD-10-CM | POA: Diagnosis not present

## 2024-01-04 DIAGNOSIS — S3993XA Unspecified injury of pelvis, initial encounter: Secondary | ICD-10-CM | POA: Diagnosis not present

## 2024-01-04 DIAGNOSIS — S0990XA Unspecified injury of head, initial encounter: Secondary | ICD-10-CM | POA: Diagnosis not present

## 2024-01-06 ENCOUNTER — Telehealth: Payer: Self-pay

## 2024-01-06 NOTE — Transitions of Care (Post Inpatient/ED Visit) (Signed)
 01/06/2024  Name: [name] MRN: [id number] DOB: 10/09/1950  Today's TOC FU Call Status: Today's TOC FU Call Status:: Successful TOC FU Call Completed TOC FU Call Complete Date: 01/06/24 Patient's Name and Date of Birth confirmed.  Transition Care Management Follow-up Telephone Call Date of Discharge: 01/04/24 Discharge Facility: Other [facility]) Name of Other (Non[facility]) Discharge Facility: [facility] Type of Discharge: Emergency Department Reason for ED Visit: Other: (Fall with Head Injury) How have you been since you were released from the hospital?: Same Any questions or concerns?: Yes Patient Questions/Concerns:: (S) Patient would like to have workup to check for Parkinson's Patient Questions/Concerns Addressed: Notified Provider of Patient Questions/Concerns  Items Reviewed: Did you receive and understand the discharge instructions provided?: Yes Medications obtained,verified, and reconciled?: Yes (Medications Reviewed) Any new allergies since your discharge?: No Dietary orders reviewed?: NA Do you have support at home?: No  Medications Reviewed Today: Medications Reviewed Today     Reviewed by Cathye Coca, LPN (Licensed Practical Nurse) on 01/06/24 at 1620  Med List Status: <None>   Medication Order Taking? Sig Documenting Provider Last Dose Status Informant  aspirin 81 MG chewable tablet [id number] Yes Chew 1 tablet (81 mg total) by mouth daily. Loma Rising, MD Taking Active Self  aspirin-acetaminophen-caffeine (EXCEDRIN MIGRAINE) 250-250-65 MG tablet [id number] Yes Take 1 tablet by mouth every 6 (six) hours as needed for headache. [provider] Taking Active   Blood Glucose Monitoring Suppl DEVI [id number] Yes 1 each by Does not apply route in the morning, at noon, and at bedtime. May substitute to any manufacturer covered by patient's insurance. Albertha Huger, FNP Taking Active Self  clopidogrel (PLAVIX) 75 MG tablet [id number] Yes  Take 1 tablet (75 mg total) by mouth daily with breakfast. Audery Blazing Deannie Fabian, MD Taking Active Self  Continuous Glucose Sensor (FREESTYLE LIBRE 3 SENSOR) [place] [id number] Yes PLACE 1 SENSOR ON THE SKIN EVERY 14 DAYS Hawks, Christy A, FNP Taking Active Self  diphenhydrAMINE (BENADRYL) 25 mg capsule [id number] Yes Take 25 mg by mouth every 6 (six) hours as needed for allergies. [provider] Taking Active Self  furosemide (LASIX) 20 MG tablet [id number] Yes Take 1 tablet (20 mg total) by mouth daily. Albertha Huger, FNP Taking Active Self  insulin aspart (NOVOLOG FLEXPEN) 100 UNIT/ML FlexPen [id number] Yes Inject 36 Units into the skin 3 (three) times daily with meals.  Patient taking differently: Inject 25-36 Units into the skin 3 (three) times daily with meals.   Vann, Jessica U, DO Taking Active Self           Med Note Broadus Canes, DAWN S   Thu May 09, 2023 11:34 AM)    insulin degludec (TRESIBA) 200 UNIT/ML FlexTouch Pen [id number] Yes Inject 30-60 Units into the skin daily. Pt states she takes 60 units if she eats and 30 units if she does not eat [provider] Taking Active Self           Med Note Alpheus Arvin, JULIE D   Tue May 07, 2023  1:18 PM) Via novo n[employer]isk patient assistance program   levocetirizine (XYZAL) 5 MG tablet [id number] Yes Take 0.5 tablets (2.5 mg total) by mouth every evening. Albertha Huger, FNP Taking Active Self  lisinopril (ZESTRIL) 10 MG tablet [id number] Yes Take 1 tablet (10 mg total) by mouth daily. Eilleen Grates, MD Taking Active Self  metoprolol tartrate (LOPRESSOR) 25 MG tablet [id number] Yes Take 2 tablets (50 mg total) by mouth 2 (  two) times daily. Demaris Fillers, MD Taking Active   nitroGLYCERIN (NITROSTAT) 0.4 MG SL tablet [id number] Yes DISSOLVE 1 TABLET UNDER TONGUE FOR CHESTPAIN.MAY REPEAT EVERY 5 MINUTES FOR 3 DOSES.IF NO RELIEF CALL 911 OR GO TO ER Rai, Hurman Maiden, MD Taking Active Self           Med Note (WARD, ANGELICA G   Thu Nov 28, 2023  7:39  PM) Pt is unsure of how many she took but said she took multiple yesterday at an unknown time  potassium chloride (KLOR-CON) 10 MEQ tablet [id number] Yes TAKE ONE (1) TABLET BY MOUTH TWO (2) TIMES DAILY Albertha Huger, FNP Taking Active Self  rOPINIRole (REQUIP) 0.5 MG tablet [id number] Yes Take 2 tablets (1 mg total) by mouth daily as needed (restless leg). Vann, Jessica U, DO Taking Active Self  rosuvastatin (CRESTOR) 20 MG tablet [id number] Yes TAKE ONE (1) TABLET BY MOUTH EVERY DAY Albertha Huger, FNP Taking Active Self  Semaglutide, 2 MG/DOSE, 8 MG/3ML SOPN [id number] Yes Inject 2 mg as directed once a week. Albertha Huger, FNP Taking Active Self           Med Note (WARD, ANGELICA G   Thu Nov 28, 2023  7:46 PM) Pt reports she stopped taking this d/t worrying it was making her sick  sertraline (ZOLOFT) 50 MG tablet [id number] Yes Take 1 tablet (50 mg total) by mouth daily. Albertha Huger, FNP Taking Active Self  traZODone (DESYREL) 50 MG tablet [id number] Yes Take 1 tablet (50 mg total) by mouth at bedtime as needed for sleep. Albertha Huger, FNP Taking Active Self  Med List Note Albertha Huger, FNP 08/09/21 1015): nov            Home Care and Equipment/Supplies: Were Home Health Services Ordered?: NA Any new equipment or medical supplies ordered?: NA  Functional Questionnaire: Do you need assistance with bathing/showering or dressing?: No Do you need assistance with meal preparation?: No Do you need assistance with eating?: No Do you need assistance with getting out of bed/getting out of a chair/moving?: No Do you have difficulty managing or taking your medications?: No  Follow up appointments reviewed: PCP Follow-up appointment confirmed?: Yes Date of PCP follow-up appointment?: 01/08/24 Follow-up Provider: Lugenia Said FNP [facility] Follow-up appointment confirmed?: NA Do you need transportation to your follow-up appointment?: No Do you understand care  options if your condition(s) worsen?: Yes-patient verbalized understanding    SIGNATURE Seabron [place], LPN [facility] Health Advisor [place] [facility] You Are. We Are. [facility] Direct Dial [phone]

## 2024-01-08 ENCOUNTER — Ambulatory Visit: Payer: PPO | Admitting: Family Medicine

## 2024-01-08 ENCOUNTER — Emergency Department (HOSPITAL_COMMUNITY)
Admission: EM | Admit: 2024-01-08 | Discharge: 2024-01-10 | Disposition: A | Discharge status: Skilled Nursing Facility | Attending: Emergency Medicine | Admitting: Emergency Medicine

## 2024-01-08 ENCOUNTER — Other Ambulatory Visit: Payer: Self-pay

## 2024-01-08 ENCOUNTER — Emergency Department (HOSPITAL_COMMUNITY)

## 2024-01-08 DIAGNOSIS — Z79899 Other long term (current) drug therapy: Secondary | ICD-10-CM | POA: Insufficient documentation

## 2024-01-08 DIAGNOSIS — W1830XA Fall on same level, unspecified, initial encounter: Secondary | ICD-10-CM | POA: Insufficient documentation

## 2024-01-08 DIAGNOSIS — R220 Localized swelling, mass and lump, head: Secondary | ICD-10-CM | POA: Diagnosis not present

## 2024-01-08 DIAGNOSIS — Z794 Long term (current) use of insulin: Secondary | ICD-10-CM | POA: Diagnosis not present

## 2024-01-08 DIAGNOSIS — S0990XA Unspecified injury of head, initial encounter: Secondary | ICD-10-CM | POA: Diagnosis not present

## 2024-01-08 DIAGNOSIS — Z7982 Long term (current) use of aspirin: Secondary | ICD-10-CM | POA: Diagnosis not present

## 2024-01-08 DIAGNOSIS — Z7902 Long term (current) use of antithrombotics/antiplatelets: Secondary | ICD-10-CM | POA: Diagnosis not present

## 2024-01-08 DIAGNOSIS — I10 Essential (primary) hypertension: Secondary | ICD-10-CM | POA: Diagnosis not present

## 2024-01-08 DIAGNOSIS — Z951 Presence of aortocoronary bypass graft: Secondary | ICD-10-CM | POA: Diagnosis not present

## 2024-01-08 DIAGNOSIS — E119 Type 2 diabetes mellitus without complications: Secondary | ICD-10-CM | POA: Diagnosis not present

## 2024-01-08 DIAGNOSIS — S42021A Displaced fracture of shaft of right clavicle, initial encounter for closed fracture: Secondary | ICD-10-CM | POA: Diagnosis not present

## 2024-01-08 DIAGNOSIS — W19XXXA Unspecified fall, initial encounter: Secondary | ICD-10-CM | POA: Diagnosis not present

## 2024-01-08 DIAGNOSIS — M25519 Pain in unspecified shoulder: Secondary | ICD-10-CM | POA: Diagnosis not present

## 2024-01-08 DIAGNOSIS — R262 Difficulty in walking, not elsewhere classified: Secondary | ICD-10-CM

## 2024-01-08 DIAGNOSIS — E785 Hyperlipidemia, unspecified: Secondary | ICD-10-CM

## 2024-01-08 DIAGNOSIS — M19011 Primary osteoarthritis, right shoulder: Secondary | ICD-10-CM | POA: Diagnosis not present

## 2024-01-08 DIAGNOSIS — R2689 Other abnormalities of gait and mobility: Secondary | ICD-10-CM | POA: Diagnosis not present

## 2024-01-08 DIAGNOSIS — E876 Hypokalemia: Secondary | ICD-10-CM

## 2024-01-08 DIAGNOSIS — I2510 Atherosclerotic heart disease of native coronary artery without angina pectoris: Secondary | ICD-10-CM | POA: Insufficient documentation

## 2024-01-08 DIAGNOSIS — I959 Hypotension, unspecified: Secondary | ICD-10-CM | POA: Diagnosis not present

## 2024-01-08 DIAGNOSIS — R519 Headache, unspecified: Secondary | ICD-10-CM | POA: Diagnosis not present

## 2024-01-08 DIAGNOSIS — S199XXA Unspecified injury of neck, initial encounter: Secondary | ICD-10-CM | POA: Diagnosis not present

## 2024-01-08 DIAGNOSIS — S4991XA Unspecified injury of right shoulder and upper arm, initial encounter: Secondary | ICD-10-CM | POA: Diagnosis present

## 2024-01-08 DIAGNOSIS — R9431 Abnormal electrocardiogram [ECG] [EKG]: Secondary | ICD-10-CM | POA: Diagnosis not present

## 2024-01-08 LAB — CBC WITH DIFFERENTIAL/PLATELET
Abs Immature Granulocytes: 0.03 10*3/uL (ref 0.00–0.07)
Basophils Absolute: 0 10*3/uL (ref 0.0–0.1)
Basophils Relative: 0 %
Eosinophils Absolute: 0.1 10*3/uL (ref 0.0–0.5)
Eosinophils Relative: 0 %
HCT: 39.2 % (ref 36.0–46.0)
Hemoglobin: 12.9 g/dL (ref 12.0–15.0)
Immature Granulocytes: 0 %
Lymphocytes Relative: 10 %
Lymphs Abs: 1.1 10*3/uL (ref 0.7–4.0)
MCH: 30.2 pg (ref 26.0–34.0)
MCHC: 32.9 g/dL (ref 30.0–36.0)
MCV: 91.8 fL (ref 80.0–100.0)
Monocytes Absolute: 0.4 10*3/uL (ref 0.1–1.0)
Monocytes Relative: 4 %
Neutro Abs: 9.5 10*3/uL — ABNORMAL HIGH (ref 1.7–7.7)
Neutrophils Relative %: 86 %
Platelets: 137 10*3/uL — ABNORMAL LOW (ref 150–400)
RBC: 4.27 MIL/uL (ref 3.87–5.11)
RDW: 14 % (ref 11.5–15.5)
WBC: 11.1 10*3/uL — ABNORMAL HIGH (ref 4.0–10.5)
nRBC: 0 % (ref 0.0–0.2)

## 2024-01-08 LAB — BASIC METABOLIC PANEL WITH GFR
Anion gap: 13 (ref 5–15)
BUN: 10 mg/dL (ref 8–23)
CO2: 28 mmol/L (ref 22–32)
Calcium: 9.4 mg/dL (ref 8.9–10.3)
Chloride: 98 mmol/L (ref 98–111)
Creatinine, Ser: 0.94 mg/dL (ref 0.44–1.00)
GFR, Estimated: >60 mL/min (ref >60)
Glucose, Bld: 229 mg/dL — ABNORMAL HIGH (ref 70–99)
Potassium: 3.8 mmol/L (ref 3.5–5.1)
Sodium: 139 mmol/L (ref 135–145)

## 2024-01-08 LAB — CBG MONITORING, ED
Glucose-Capillary: 209 mg/dL — ABNORMAL HIGH (ref 70–99)
Glucose-Capillary: 296 mg/dL — ABNORMAL HIGH (ref 70–99)
Glucose-Capillary: 285 mg/dL — ABNORMAL HIGH (ref 70–99)
Glucose-Capillary: 265 mg/dL — ABNORMAL HIGH (ref 70–99)

## 2024-01-08 LAB — CK: Total CK: 90 U/L (ref 38–234)

## 2024-01-08 MED ORDER — METOPROLOL TARTRATE 50 MG PO TABS
50.0000 mg | ORAL_TABLET | Freq: Two times a day (BID) | ORAL | Status: DC
Start: 2024-01-08 — End: 2024-01-10
  Administered 2024-01-08 – 2024-01-10 (×4): 50 mg via ORAL
  Filled 2024-01-08 (×4): qty 1

## 2024-01-08 MED ORDER — ACETAMINOPHEN 325 MG PO TABS
650.0000 mg | ORAL_TABLET | ORAL | Status: DC | PRN
  Administered 2024-01-08 – 2024-01-09 (×3): 650 mg via ORAL
  Filled 2024-01-08 (×3): qty 2

## 2024-01-08 MED ORDER — INSULIN ASPART 100 UNIT/ML IJ SOLN
0.0000 [IU] | Freq: Every day | INTRAMUSCULAR | Status: DC
  Administered 2024-01-08: 3 [IU] via SUBCUTANEOUS
  Filled 2024-01-08 (×2): qty 1

## 2024-01-08 MED ORDER — SERTRALINE HCL 50 MG PO TABS
50.0000 mg | ORAL_TABLET | Freq: Every day | ORAL | Status: DC
  Administered 2024-01-08 – 2024-01-10 (×3): 50 mg via ORAL
  Filled 2024-01-08 (×3): qty 1

## 2024-01-08 MED ORDER — LIDOCAINE 5 % EX PTCH
1.0000 | MEDICATED_PATCH | CUTANEOUS | Status: DC
  Administered 2024-01-08 – 2024-01-10 (×2): 1 via TRANSDERMAL
  Filled 2024-01-08 (×2): qty 1

## 2024-01-08 MED ORDER — CLOPIDOGREL BISULFATE 75 MG PO TABS
75.0000 mg | ORAL_TABLET | Freq: Every day | ORAL | Status: DC
  Administered 2024-01-08 – 2024-01-10 (×3): 75 mg via ORAL
  Filled 2024-01-08 (×3): qty 1

## 2024-01-08 MED ORDER — ROPINIROLE HCL 1 MG PO TABS: 1.0000 mg | ORAL_TABLET | Freq: Every day | ORAL | Status: DC | PRN

## 2024-01-08 MED ORDER — CLOPIDOGREL BISULFATE 75 MG PO TABS: 75.0000 mg | ORAL_TABLET | Freq: Every day | ORAL | Status: DC

## 2024-01-08 MED ORDER — LORATADINE 10 MG PO TABS
10.0000 mg | ORAL_TABLET | Freq: Every day | ORAL | Status: DC
  Administered 2024-01-08 – 2024-01-10 (×3): 10 mg via ORAL
  Filled 2024-01-08 (×3): qty 1

## 2024-01-08 MED ORDER — INSULIN ASPART 100 UNIT/ML IJ SOLN
0.0000 [IU] | Freq: Three times a day (TID) | INTRAMUSCULAR | Status: DC
  Administered 2024-01-08: 8 [IU] via SUBCUTANEOUS
  Administered 2024-01-09 (×2): 5 [IU] via SUBCUTANEOUS
  Administered 2024-01-09: 15 [IU] via SUBCUTANEOUS
  Administered 2024-01-10 (×2): 5 [IU] via SUBCUTANEOUS
  Filled 2024-01-08 (×6): qty 1

## 2024-01-08 MED ORDER — LISINOPRIL 10 MG PO TABS
10.0000 mg | ORAL_TABLET | Freq: Every day | ORAL | Status: DC
  Administered 2024-01-08 – 2024-01-10 (×3): 10 mg via ORAL
  Filled 2024-01-08 (×3): qty 1

## 2024-01-08 MED ORDER — TRAZODONE HCL 50 MG PO TABS
50.0000 mg | ORAL_TABLET | Freq: Every evening | ORAL | Status: DC | PRN
  Administered 2024-01-08: 50 mg via ORAL
  Filled 2024-01-08: qty 1

## 2024-01-08 MED ORDER — POTASSIUM CHLORIDE CRYS ER 10 MEQ PO TBCR
10.0000 meq | EXTENDED_RELEASE_TABLET | Freq: Every day | ORAL | Status: DC
  Administered 2024-01-08 – 2024-01-10 (×3): 10 meq via ORAL
  Filled 2024-01-08 (×3): qty 1

## 2024-01-08 MED ORDER — ROSUVASTATIN CALCIUM 20 MG PO TABS
20.0000 mg | ORAL_TABLET | Freq: Every day | ORAL | Status: DC
  Administered 2024-01-08 – 2024-01-10 (×3): 20 mg via ORAL
  Filled 2024-01-08 (×3): qty 1

## 2024-01-08 MED ORDER — ASPIRIN 81 MG PO CHEW
81.0000 mg | CHEWABLE_TABLET | Freq: Every day | ORAL | Status: DC
  Administered 2024-01-08 – 2024-01-10 (×3): 81 mg via ORAL
  Filled 2024-01-08 (×3): qty 1

## 2024-01-08 MED ORDER — FUROSEMIDE 40 MG PO TABS
20.0000 mg | ORAL_TABLET | Freq: Every day | ORAL | Status: DC
  Administered 2024-01-08 – 2024-01-10 (×3): 20 mg via ORAL
  Filled 2024-01-08 (×3): qty 1

## 2024-01-08 MED ORDER — OXYCODONE HCL 5 MG PO TABS
5.0000 mg | ORAL_TABLET | ORAL | Status: DC | PRN
  Administered 2024-01-08 – 2024-01-09 (×5): 5 mg via ORAL
  Filled 2024-01-08 (×5): qty 1

## 2024-01-08 MED ORDER — LEVOCETIRIZINE DIHYDROCHLORIDE 5 MG PO TABS: 2.5000 mg | ORAL_TABLET | Freq: Every evening | ORAL | Status: DC

## 2024-01-08 NOTE — NC FL2 (Signed)
 [place]  MEDICAID FL2 LEVEL OF CARE FORM     IDENTIFICATION  Patient Name: [name] Birthdate: 1951/08/20 Sex: female Admission Date (Current Location): 01/08/2024  [facility] and [place] Number:  [employer] and Address:  [facility],  [address], [place] [address]      Provider Number: [id number]  Attending Physician Name and Address:  Ninetta Basket, MD  Relative Name and Phone Number:  Ether Hercules [phone]    Current Level of Care:   Recommended Level of Care: Skilled Nursing Facility Prior Approval Number:    Date Approved/Denied:   PASRR Number: Pending  Discharge Plan: SNF    Current Diagnoses: Patient Active Problem List   Diagnosis Date Noted   Hyperosmolar hyperglycemic state (HHS) (HCC) 11/28/2023   Acute renal failure superimposed on stage 3b chronic kidney disease (HCC) 11/28/2023   Hypertensive urgency 11/28/2023   Stage 3b chronic kidney disease (HCC) 10/09/2023   Vertigo 05/13/2023   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   Unstable angina (HCC) 02/12/2023   Chest pain 03/26/2022   Nausea and vomiting 03/26/2022   Incomplete emptying of bladder 02/20/2022   Restless leg syndrome 01/09/2021   Insomnia due to medical condition 01/09/2021   Congestive heart failure (HCC) 11/30/2020   Depression, recurrent (HCC) 11/02/2020   Recurrent UTI 10/11/2020   Generalized weakness 10/11/2020   Recurrent falls 10/11/2020   QT prolongation 10/11/2020   Osteopenia after menopause 12/13/2017   H/O prolonged Q-T interval on ECG 03/06/2017   Vitamin B12 deficiency 01/13/2016   History of stroke 09/01/2015   History of TIA (transient ischemic attack) 08/14/2014   Uncontrolled type 2 diabetes mellitus with hyperglycemia, with long-term current use of insulin (HCC) 08/14/2014   Cataract 02/08/2014   Macular degeneration 02/08/2014   Accelerating angina (HCC) 01/07/2014   Generalized anxiety disorder 09/14/2013   Claudication (HCC)  06/18/2013   Gastroesophageal reflux disease without esophagitis 01/01/2012   Hypokalemia 08/24/2011   Hypertension associated with diabetes (HCC) 08/23/2011   PALPITATIONS 02/10/2010   Hyperlipidemia associated with type 2 diabetes mellitus (HCC) 05/10/2009   Coronary artery disease involving native coronary artery of native heart with unstable angina pectoris (HCC) 08/26/2008    Orientation RESPIRATION BLADDER Height & Weight     Self, Time, Situation, Place  Normal Continent Weight: 165 lb (74.8 kg) Height:  5\' 6"  (167.6 cm)  BEHAVIORAL SYMPTOMS/MOOD NEUROLOGICAL BOWEL NUTRITION STATUS      Continent Diet  AMBULATORY STATUS COMMUNICATION OF NEEDS Skin   Extensive Assist Verbally Bruising (Left side of head and different places on body)                       Personal Care Assistance Level of Assistance  Bathing, Feeding, Dressing Bathing Assistance: Limited assistance Feeding assistance: Independent Dressing Assistance: Limited assistance     Functional Limitations Info  Sight, Hearing, Speech Sight Info: Impaired Financial trader) Hearing Info: Adequate Speech Info: Adequate    SPECIAL CARE FACTORS FREQUENCY  PT (By licensed PT), OT (By licensed OT)     PT Frequency: 5 x a week OT Frequency: 5 x a week            Contractures Contractures Info: Not present    Additional Factors Info  Code Status Code Status Info: FULL             Current Medications (01/08/2024):  This is the current hospital active medication list Current Facility-Administered Medications  Medication Dose  Route Frequency Provider Last Rate Last Admin   acetaminophen (TYLENOL) tablet 650 mg  650 mg Oral Q4H PRN Rondel Baton, MD   650 mg at 01/08/24 1216   lidocaine (LIDODERM) 5 % 1 patch  1 patch Transdermal Q24H Rondel Baton, MD   1 patch at 01/08/24 1217   oxyCODONE (Oxy IR/ROXICODONE) immediate release tablet 5 mg  5 mg Oral Q3H PRN Rondel Baton, MD   5 mg at 01/08/24  1217   Current Outpatient Medications  Medication Sig Dispense Refill   aspirin 81 MG chewable tablet Chew 1 tablet (81 mg total) by mouth daily. 90 tablet 3   aspirin-acetaminophen-caffeine (EXCEDRIN MIGRAINE) 250-250-65 MG tablet Take 1 tablet by mouth every 6 (six) hours as needed for headache.     clopidogrel (PLAVIX) 75 MG tablet Take 1 tablet (75 mg total) by mouth daily with breakfast. 90 tablet 3   diphenhydrAMINE (BENADRYL) 25 mg capsule Take 25 mg by mouth every 6 (six) hours as needed for allergies.     furosemide (LASIX) 20 MG tablet Take 1 tablet (20 mg total) by mouth daily. 90 tablet 1   insulin aspart (NOVOLOG FLEXPEN) 100 UNIT/ML FlexPen Inject 36 Units into the skin 3 (three) times daily with meals. (Patient taking differently: Inject 25-36 Units into the skin 3 (three) times daily with meals.)     insulin degludec (TRESIBA) 200 UNIT/ML FlexTouch Pen Inject 30-60 Units into the skin daily. Pt states she takes 60 units if she eats and 30 units if she does not eat     levocetirizine (XYZAL) 5 MG tablet Take 0.5 tablets (2.5 mg total) by mouth every evening. 90 tablet 3   lisinopril (ZESTRIL) 10 MG tablet Take 1 tablet (10 mg total) by mouth daily. 90 tablet 3   metoprolol tartrate (LOPRESSOR) 25 MG tablet Take 2 tablets (50 mg total) by mouth 2 (two) times daily. 60 tablet 2   nitroGLYCERIN (NITROSTAT) 0.4 MG SL tablet DISSOLVE 1 TABLET UNDER TONGUE FOR CHESTPAIN.MAY REPEAT EVERY 5 MINUTES FOR 3 DOSES.IF NO RELIEF CALL 911 OR GO TO ER 25 tablet 3   potassium chloride (KLOR-CON) 10 MEQ tablet TAKE ONE (1) TABLET BY MOUTH TWO (2) TIMES DAILY (Patient taking differently: Take 10 mEq by mouth daily.) 180 tablet 0   rOPINIRole (REQUIP) 0.5 MG tablet Take 2 tablets (1 mg total) by mouth daily as needed (restless leg).     rosuvastatin (CRESTOR) 20 MG tablet TAKE ONE (1) TABLET BY MOUTH EVERY DAY 90 tablet 0   sertraline (ZOLOFT) 50 MG tablet Take 1 tablet (50 mg total) by mouth daily. 90  tablet 3   traZODone (DESYREL) 50 MG tablet Take 1 tablet (50 mg total) by mouth at bedtime as needed for sleep. 90 tablet 3   Blood Glucose Monitoring Suppl DEVI 1 each by Does not apply route in the morning, at noon, and at bedtime. May substitute to any manufacturer covered by patient's insurance. 1 each 0   Continuous Glucose Sensor (FREESTYLE LIBRE 3 SENSOR) MISC PLACE 1 SENSOR ON THE SKIN EVERY 14 DAYS 6 each 3   Semaglutide, 2 MG/DOSE, 8 MG/3ML SOPN Inject 2 mg as directed once a week. (Patient not taking: Reported on 01/08/2024)       Discharge Medications: Please see discharge summary for a list of discharge medications.  Relevant Imaging Results:  Relevant Lab Results:   Additional Information SSN: [id number]  Isabella Bowens, [place]

## 2024-01-08 NOTE — ED Provider Notes (Signed)
 [place] EMERGENCY DEPARTMENT AT [facility] Provider Note   CSN: [id number] Arrival date & time: 01/08/24  5277     History  Chief Complaint  Patient presents with   Fall    Mechanical fall from standing position backwards. Second fall this morning.    [name] is a 73 y.o. female.  73 year old female with a history of CAD status post CABG, diabetes, hypertension, and hyperlipidemia who presents emergency department shoulder pain after a fall.  Patient reports that she was getting dressed this morning when her legs gave out and she fell.  Says that she landed backwards.  Did hit her head.  No LOC.  No preceding symptoms.  Says that this happens frequently for her.  Also is complaining of right shoulder pain along her clavicle.  Was on the ground until her neighbor was able to get her which she estimates was about 30 minutes.  Says that she feels a popping sensation when she tries to move her shoulder.  Is on aspirin and Plavix.  Was seen at [facility] on 01/04/2024 and had trauma pan scans after she had a fall which did not reveal any significant traumatic injuries.       Home Medications Prior to Admission medications   Medication Sig Start Date End Date Taking? Authorizing Provider  aspirin 81 MG chewable tablet Chew 1 tablet (81 mg total) by mouth daily. 03/28/22  Yes Rai, Ripudeep Kirtland Bouchard, MD  aspirin-acetaminophen-caffeine (EXCEDRIN MIGRAINE) [phone] MG tablet Take 1 tablet by mouth every 6 (six) hours as needed for headache.   Yes [provider]  clopidogrel (PLAVIX) 75 MG tablet Take 1 tablet (75 mg total) by mouth daily with breakfast. 10/02/23  Yes Crenshaw, Madolyn Frieze, MD  diphenhydrAMINE (BENADRYL) 25 mg capsule Take 25 mg by mouth every 6 (six) hours as needed for allergies.   Yes [provider]  furosemide (LASIX) 20 MG tablet Take 1 tablet (20 mg total) by mouth daily. 10/02/23  Yes Gabriel Earing, FNP  insulin aspart (NOVOLOG FLEXPEN) 100  UNIT/ML FlexPen Inject 36 Units into the skin 3 (three) times daily with meals. Patient taking differently: Inject 25-36 Units into the skin 3 (three) times daily with meals. 10/06/22  Yes Vann, Jessica U, DO  insulin degludec (TRESIBA) 200 UNIT/ML FlexTouch Pen Inject 30-60 Units into the skin daily. Pt states she takes 60 units if she eats and 30 units if she does not eat   Yes [provider]  levocetirizine (XYZAL) 5 MG tablet Take 0.5 tablets (2.5 mg total) by mouth every evening. 03/06/23  Yes Gabriel Earing, FNP  lisinopril (ZESTRIL) 10 MG tablet Take 1 tablet (10 mg total) by mouth daily. 11/21/22  Yes Rollene Rotunda, MD  metoprolol tartrate (LOPRESSOR) 25 MG tablet Take 2 tablets (50 mg total) by mouth 2 (two) times daily. 11/29/23  Yes Tat, Onalee Hua, MD  nitroGLYCERIN (NITROSTAT) 0.4 MG SL tablet DISSOLVE 1 TABLET UNDER TONGUE FOR CHESTPAIN.MAY REPEAT EVERY 5 MINUTES FOR 3 DOSES.IF NO RELIEF CALL 911 OR GO TO ER 03/27/22  Yes Rai, Ripudeep K, MD  potassium chloride (KLOR-CON) 10 MEQ tablet TAKE ONE (1) TABLET BY MOUTH TWO (2) TIMES DAILY Patient taking differently: Take 10 mEq by mouth daily. 06/17/23  Yes Gabriel Earing, FNP  rOPINIRole (REQUIP) 0.5 MG tablet Take 2 tablets (1 mg total) by mouth daily as needed (restless leg). 10/06/22  Yes Vann, Jessica U, DO  rosuvastatin (CRESTOR) 20 MG tablet TAKE  ONE (1) TABLET BY MOUTH EVERY DAY 10/02/23  Yes Gabriel Earing, FNP  sertraline (ZOLOFT) 50 MG tablet Take 1 tablet (50 mg total) by mouth daily. 05/08/23  Yes Gabriel Earing, FNP  traZODone (DESYREL) 50 MG tablet Take 1 tablet (50 mg total) by mouth at bedtime as needed for sleep. 05/08/23  Yes Gabriel Earing, FNP  Blood Glucose Monitoring Suppl DEVI 1 each by Does not apply route in the morning, at noon, and at bedtime. May substitute to any manufacturer covered by patient's insurance. 06/19/23   Gabriel Earing, FNP  Continuous Glucose Sensor (FREESTYLE LIBRE 3 SENSOR) MISC PLACE 1  SENSOR ON THE SKIN EVERY 14 DAYS 05/16/23   Junie Spencer, FNP  Semaglutide, 2 MG/DOSE, 8 MG/3ML SOPN Inject 2 mg as directed once a week. Patient not taking: Reported on 01/08/2024 07/26/23   Gabriel Earing, FNP      Allergies    Iohexol, Ticlid [ticlopidine hcl], Jardiance [empagliflozin], Metformin and related, and Codeine    Review of Systems   Review of Systems  Physical Exam Updated Vital Signs BP 107/68   Pulse 79   Temp 97.8 F (36.6 C) (Oral)   Resp 18   Ht 5\' 6"  (1.676 m)   Wt 74.8 kg   SpO2 94%   BMI 26.63 kg/m  Physical Exam Vitals and nursing note reviewed.  Constitutional:      General: She is not in acute distress.    Appearance: Normal appearance. She is well-developed. She is not ill-appearing.  HENT:     Head: Normocephalic and atraumatic.     Right Ear: External ear normal.     Left Ear: External ear normal.     Nose: Nose normal.     Mouth/Throat:     Mouth: Mucous membranes are moist.     Pharynx: Oropharynx is clear.  Eyes:     Extraocular Movements: Extraocular movements intact.     Conjunctiva/sclera: Conjunctivae normal.     Pupils: Pupils are equal, round, and reactive to light.  Neck:     Comments: No C-spine midline tenderness to palpation Cardiovascular:     Rate and Rhythm: Normal rate and regular rhythm.     Pulses: Normal pulses.     Heart sounds: Normal heart sounds. No murmur heard. Pulmonary:     Effort: Pulmonary effort is normal. No respiratory distress.     Breath sounds: Normal breath sounds.  Abdominal:     General: Abdomen is flat. There is no distension.     Palpations: Abdomen is soft. There is no mass.     Tenderness: There is no abdominal tenderness. There is no guarding.  Musculoskeletal:     Cervical back: Normal range of motion and neck supple. No rigidity or tenderness.     Right lower leg: No edema.     Left lower leg: No edema.     Comments: No tenderness to palpation of midline thoracic or lumbar spine.   No step-offs palpated.  No tenderness to palpation of chest wall.  No bruising noted.  Tenderness palpation of right clavicle.  No tenderness to palpation, bruising, or deformities noted of bilateral elbows, wrists, hips, knees, or ankles.  Mild tenderness to palpation of right anterior shoulder.  Intact sensation light touch over the deltoid and on hand and median, radial, and ulnar distribution.  Full strength of right elbow flexion.  Right shoulder flexion and abduction limited due to pain.  Full grip strength of  right hand.  No skin tenting above the right clavicle.  Skin:    General: Skin is warm and dry.  Neurological:     General: No focal deficit present.     Mental Status: She is alert and oriented to person, place, and time. Mental status is at baseline.     Cranial Nerves: No cranial nerve deficit.     Sensory: No sensory deficit.     Motor: Weakness (Full strength in left upper extremity and bilateral lower extremities.  Right upper extremity exam limited due to pain.) present.  Psychiatric:        Mood and Affect: Mood normal.     ED Results / Procedures / Treatments   Labs (all labs ordered are listed, but only abnormal results are displayed) Labs Reviewed  BASIC METABOLIC PANEL WITH GFR - Abnormal; Notable for the following components:      Result Value   Glucose, Bld 229 (*)    All other components within normal limits  CBC WITH DIFFERENTIAL/PLATELET - Abnormal; Notable for the following components:   WBC 11.1 (*)    Platelets 137 (*)    Neutro Abs 9.5 (*)    All other components within normal limits  CBG MONITORING, ED - Abnormal; Notable for the following components:   Glucose-Capillary 209 (*)    All other components within normal limits  CBG MONITORING, ED - Abnormal; Notable for the following components:   Glucose-Capillary 296 (*)    All other components within normal limits  CK    EKG None  Radiology DG Shoulder Right Portable Result Date:  01/08/2024 CLINICAL DATA:  Right shoulder pain status post fall EXAM: RIGHT CLAVICLE - 2+ VIEWS; RIGHT SHOULDER - 1 VIEW COMPARISON:  07/03/2016 FINDINGS: Right clavicle: Mildly displaced oblique fracture of the mid right clavicle. Visualized soft tissues are unremarkable. Right shoulder: No additional fracture or dislocation. Mild-to-moderate degenerative changes of the acromioclavicular joint. Irregularity of the greater tuberosity indicative of rotator cuff tendinopathy. IMPRESSION: Mildly displaced oblique fracture of the mid right clavicle. Electronically Signed   By: Elester Grim M.D.   On: 01/08/2024 10:34   DG Clavicle Right Result Date: 01/08/2024 CLINICAL DATA:  Right shoulder pain status post fall EXAM: RIGHT CLAVICLE - 2+ VIEWS; RIGHT SHOULDER - 1 VIEW COMPARISON:  07/03/2016 FINDINGS: Right clavicle: Mildly displaced oblique fracture of the mid right clavicle. Visualized soft tissues are unremarkable. Right shoulder: No additional fracture or dislocation. Mild-to-moderate degenerative changes of the acromioclavicular joint. Irregularity of the greater tuberosity indicative of rotator cuff tendinopathy. IMPRESSION: Mildly displaced oblique fracture of the mid right clavicle. Electronically Signed   By: Elester Grim M.D.   On: 01/08/2024 10:34   CT Head Wo Contrast Result Date: 01/08/2024 CLINICAL DATA:  Head trauma, minor (Age >= 65y); Neck trauma (Age >= 65y). Multiple recent falls. Headache and right shoulder pain. EXAM: CT HEAD WITHOUT CONTRAST CT CERVICAL SPINE WITHOUT CONTRAST TECHNIQUE: Multidetector CT imaging of the head and cervical spine was performed following the standard protocol without intravenous contrast. Multiplanar CT image reconstructions of the cervical spine were also generated. RADIATION DOSE REDUCTION: This exam was performed according to the departmental dose-optimization program which includes automated exposure control, adjustment of the mA and/or kV according to patient  size and/or use of iterative reconstruction technique. COMPARISON:  Head CT and MRI 11/28/2023 FINDINGS: CT HEAD FINDINGS Brain: There is no evidence of an acute infarct, intracranial hemorrhage, mass, midline shift, or extra-axial fluid collection. Cerebral white matter hypodensities  are unchanged and nonspecific but compatible with mild chronic small vessel ischemic disease. Mild-to-moderate ventriculomegaly is unchanged and attributed to central predominant cerebral atrophy. Vascular: Calcified atherosclerosis at the skull base. No hyperdense vessel. Skull: No acute fracture or suspicious lesion. Sinuses/Orbits: Visualized paranasal sinuses and mastoid air cells are clear para bilateral cataract extraction. Other: Mild left parietal scalp swelling/contusion. CT CERVICAL SPINE FINDINGS Alignment: Cervical spine straightening.  No significant listhesis. Skull base and vertebrae: No acute fracture or suspicious lesion. Soft tissues and spinal canal: No prevertebral fluid or swelling. No visible canal hematoma. Disc levels: Cervical spondylosis, most notable at C5-6 where a large left paracentral disc osteophyte complex results in at least moderate spinal stenosis. Mild spinal stenosis at C4-5 and C6-7. Up to moderate multilevel neural foraminal stenosis. Upper chest: No mass or consolidation in the included lung apices. Other: Moderate atherosclerotic calcifications about the carotid bifurcations. IMPRESSION: 1. No evidence of acute intracranial abnormality or cervical spine fracture. 2. Cervical spondylosis including at least moderate spinal stenosis at C5-6. Electronically Signed   By: Aundra Lee M.D.   On: 01/08/2024 09:51   CT Cervical Spine Wo Contrast Result Date: 01/08/2024 CLINICAL DATA:  Head trauma, minor (Age >= 65y); Neck trauma (Age >= 65y). Multiple recent falls. Headache and right shoulder pain. EXAM: CT HEAD WITHOUT CONTRAST CT CERVICAL SPINE WITHOUT CONTRAST TECHNIQUE: Multidetector CT imaging  of the head and cervical spine was performed following the standard protocol without intravenous contrast. Multiplanar CT image reconstructions of the cervical spine were also generated. RADIATION DOSE REDUCTION: This exam was performed according to the departmental dose-optimization program which includes automated exposure control, adjustment of the mA and/or kV according to patient size and/or use of iterative reconstruction technique. COMPARISON:  Head CT and MRI 11/28/2023 FINDINGS: CT HEAD FINDINGS Brain: There is no evidence of an acute infarct, intracranial hemorrhage, mass, midline shift, or extra-axial fluid collection. Cerebral white matter hypodensities are unchanged and nonspecific but compatible with mild chronic small vessel ischemic disease. Mild-to-moderate ventriculomegaly is unchanged and attributed to central predominant cerebral atrophy. Vascular: Calcified atherosclerosis at the skull base. No hyperdense vessel. Skull: No acute fracture or suspicious lesion. Sinuses/Orbits: Visualized paranasal sinuses and mastoid air cells are clear para bilateral cataract extraction. Other: Mild left parietal scalp swelling/contusion. CT CERVICAL SPINE FINDINGS Alignment: Cervical spine straightening.  No significant listhesis. Skull base and vertebrae: No acute fracture or suspicious lesion. Soft tissues and spinal canal: No prevertebral fluid or swelling. No visible canal hematoma. Disc levels: Cervical spondylosis, most notable at C5-6 where a large left paracentral disc osteophyte complex results in at least moderate spinal stenosis. Mild spinal stenosis at C4-5 and C6-7. Up to moderate multilevel neural foraminal stenosis. Upper chest: No mass or consolidation in the included lung apices. Other: Moderate atherosclerotic calcifications about the carotid bifurcations. IMPRESSION: 1. No evidence of acute intracranial abnormality or cervical spine fracture. 2. Cervical spondylosis including at least moderate  spinal stenosis at C5-6. Electronically Signed   By: Aundra Lee M.D.   On: 01/08/2024 09:51    Procedures Procedures    Medications Ordered in ED Medications  oxyCODONE (Oxy IR/ROXICODONE) immediate release tablet 5 mg (5 mg Oral Given 01/08/24 1217)  acetaminophen (TYLENOL) tablet 650 mg (650 mg Oral Given 01/08/24 1216)  lidocaine (LIDODERM) 5 % 1 patch (1 patch Transdermal Patch Applied 01/08/24 1217)  insulin aspart (novoLOG) injection 0-15 Units (8 Units Subcutaneous Given 01/08/24 1523)  insulin aspart (novoLOG) injection 0-5 Units (has no administration in time  range)  aspirin chewable tablet 81 mg (has no administration in time range)  rOPINIRole (REQUIP) tablet 1 mg (has no administration in time range)  lisinopril (ZESTRIL) tablet 10 mg (has no administration in time range)  sertraline (ZOLOFT) tablet 50 mg (has no administration in time range)  traZODone (DESYREL) tablet 50 mg (has no administration in time range)  potassium chloride (KLOR-CON M) CR tablet 10 mEq (has no administration in time range)  rosuvastatin (CRESTOR) tablet 20 mg (has no administration in time range)  furosemide (LASIX) tablet 20 mg (has no administration in time range)  metoprolol tartrate (LOPRESSOR) tablet 50 mg (has no administration in time range)  loratadine (CLARITIN) tablet 10 mg (has no administration in time range)  clopidogrel (PLAVIX) tablet 75 mg (has no administration in time range)    ED Course/ Medical Decision Making/ A&P Clinical Course as of 01/08/24 1635  Wed Jan 08, 2024  1133 Dr Tat from hospitalist contacted.  Feels that she does not meet admission requirements at this time. [RP]  1357 Seen by physical therapy who is recommending SNF for the patient. [RP]    Clinical Course User Index [RP] Ninetta Basket, MD                                 Medical Decision Making Amount and/or Complexity of Data Reviewed Labs: ordered. Radiology: ordered.  Risk OTC  drugs. Prescription drug management.   ARMELIA PENTON is a 73 y.o. female with comorbidities that complicate the patient evaluation including CAD status post CABG, diabetes, hypertension, and hyperlipidemia who presents emergency department shoulder pain after a fall.    Initial Ddx:  TBI, C-spine injury, proximal humerus fracture, clavicular fracture, shoulder dislocation, rhabdo  MDM/Course:  Patient presents emergency department after a fall.  Did appear to hit her head.  Is on Plavix.  Also was complaining of right shoulder pain.  On exam no significant signs of head trauma.  Does have tenderness to palpation along her clavicle.  Appears to be neurovascularly intact in her arm.  Underwent a CT of the head and C-spine that did not show acute abnormality.  X-ray does show a mildly displaced midshaft clavicular fracture.  She was placed in a shoulder sling.  Unfortunately she does have frequent falls and is very unsteady on her feet.  Suspect this is likely due to chronic deconditioning.  She is unable to walk with a walker now and is unsafe to go home.  She was evaluated by physical therapy who is recommending SNF for the patient.  TOC was consulted and is working on placement for the patient at this time.  Home meds reordered.  This patient presents to the ED for concern of complaints listed in HPI, this involves an extensive number of treatment options, and is a complaint that carries with it a high risk of complications and morbidity. Disposition including potential need for admission considered.   Dispo: boarder  Records reviewed Outpatient Clinic Notes The following labs were independently interpreted: Chemistry and show no acute abnormality I independently reviewed the following imaging with scope of interpretation limited to determining acute life threatening conditions related to emergency care: CT Head and agree with the radiologist interpretation with the following exceptions: none I  personally reviewed and interpreted cardiac monitoring: normal sinus rhythm  I personally reviewed and interpreted the pt's EKG: see above for interpretation  I have reviewed the patients home  medications and made adjustments as needed Consults: TOC and PT Social Determinants of health:  Geriatric  Portions of this note were generated with Scientist, clinical (histocompatibility and immunogenetics). Dictation errors may occur despite best attempts at proofreading.     Final Clinical Impression(s) / ED Diagnoses Final diagnoses:  Fall, initial encounter  Injury of head, initial encounter  Closed displaced fracture of shaft of right clavicle, initial encounter  Ambulatory dysfunction    Rx / DC Orders ED Discharge Orders     None         Ninetta Basket, MD 01/08/24 1635

## 2024-01-08 NOTE — ED Triage Notes (Signed)
 Pt BIB [facility] with history of frequent falls on Plavix. Last fall last week hit left side of head, bruised with HA since. Fall this morning resulted in rt shoulder pain with "pop." Pt denies LOC.   Hx CABG, DM. BG 226 from EMS. VSS.

## 2024-01-08 NOTE — ED Notes (Signed)
 EDP at bedside during triage

## 2024-01-08 NOTE — ED Notes (Signed)
 Pt Libra monitor was ringing out, said sugar was 301. Finger stick 296. MD notified.

## 2024-01-08 NOTE — Evaluation (Signed)
 Physical Therapy Evaluation Patient Details Name: [name] MRN: [id number] DOB: [birth year] Today's Date: 01/08/2024  History of Present Illness  [name]. [place] is a 73 y/o female with history of frequent falls on Plavix. Last fall last week hit left side of head, bruised with HA since. Fall this morning resulted in rt shoulder pain with "pop." Pt denies LOC.  Clinical Impression  Patient presents with sling to RUE due to clavicular fracture, demonstrates slow labored movement for sitting up at bedside, unsteady on feet requiring hand held assist for safety, able to ambulate into hallway using LUE on RW, but frequent veering to the right and limited mostly due to fatigue, increasing right shoulder pain. Patient tolerated sitting up in chair after therapy with family members present - RN aware. Patient will benefit from continued skilled physical therapy in hospital and recommended venue below to increase strength, balance, endurance for safe ADLs and gait.           If plan is discharge home, recommend the following: A little help with walking and/or transfers;A little help with bathing/dressing/bathroom;Assistance with cooking/housework;Help with stairs or ramp for entrance   Can travel by private vehicle   Yes    Equipment Recommendations None recommended by PT  Recommendations for Other Services       Functional Status Assessment Patient has had a recent decline in their functional status and demonstrates the ability to make significant improvements in function in a reasonable and predictable amount of time.     Precautions / Restrictions Precautions Precautions: Fall Required Braces or Orthoses: Sling Restrictions Weight Bearing Restrictions Per Provider Order: Yes RUE Weight Bearing Per Provider Order: Non weight bearing      Mobility  Bed Mobility Overal bed mobility: Needs Assistance Bed Mobility: Supine to Sit     Supine to sit: Min assist, Contact guard      General bed mobility comments: increased time, labored movement    Transfers Overall transfer level: Needs assistance Equipment used: Rolling walker (2 wheels), 1 person hand held assist Transfers: Sit to/from Stand, Bed to chair/wheelchair/BSC Sit to Stand: Contact guard assist, Min assist   Step pivot transfers: Contact guard assist, Min assist       General transfer comment: unsteady labored movement, had to lean on nearby objects for support use LUE    Ambulation/Gait Ambulation/Gait assistance: Min assist Gait Distance (Feet): 40 Feet Assistive device: Rolling walker (2 wheels) Gait Pattern/deviations: Decreased step length - right, Decreased step length - left, Decreased stride length Gait velocity: decreased     General Gait Details: slow labored movement leaning on RW with LUE, limited mostly due to fatigue and increasing right shoulder pain  Stairs            Wheelchair Mobility     Tilt Bed    Modified Rankin (Stroke Patients Only)       Balance Overall balance assessment: Needs assistance Sitting-balance support: Feet unsupported, No upper extremity supported Sitting balance-Leahy Scale: Fair Sitting balance - Comments: fair/good seated at EOB   Standing balance support: During functional activity, Single extremity supported Standing balance-Leahy Scale: Fair Standing balance comment: leaning on RW with LUE or hand held assist                             Pertinent Vitals/Pain Pain Assessment Pain Assessment: Faces Faces Pain Scale: Hurts even more Pain Location: Right shoulder, clavicle Pain Descriptors / Indicators:  Sore, Sharp, Grimacing, Guarding Pain Intervention(s): Limited activity within patient's tolerance, Monitored during session, Premedicated before session, Repositioned    Home Living Family/patient expects to be discharged to:: Private residence Living Arrangements: Alone Available Help at Discharge:  Family;Available PRN/intermittently Type of Home: Apartment Home Access: Elevator       Home Layout: One level Home Equipment: Cane - single point;Rollator (4 wheels);Shower seat      Prior Function Prior Level of Function : Independent/Modified Independent;Driving             Mobility Comments: Community ambulation using SPC PRN, drives ADLs Comments: Independent     Extremity/Trunk Assessment   Upper Extremity Assessment Upper Extremity Assessment: Defer to OT evaluation    Lower Extremity Assessment Lower Extremity Assessment: Generalized weakness    Cervical / Trunk Assessment Cervical / Trunk Assessment: Normal  Communication   Communication Communication: No apparent difficulties    Cognition Arousal: Alert Behavior During Therapy: WFL for tasks assessed/performed   PT - Cognitive impairments: No apparent impairments                         Following commands: Intact       Cueing Cueing Techniques: Verbal cues, Tactile cues     General Comments      Exercises     Assessment/Plan    PT Assessment Patient needs continued PT services  PT Problem List Decreased strength;Decreased activity tolerance;Decreased range of motion;Decreased balance;Decreased mobility       PT Treatment Interventions DME instruction;Gait training;Stair training;Functional mobility training;Therapeutic activities;Therapeutic exercise;Balance training;Patient/family education    PT Goals (Current goals can be found in the Care Plan section)  Acute Rehab PT Goals Patient Stated Goal: Return home with family to assist PT Goal Formulation: With patient/family Time For Goal Achievement: 01/22/24 Potential to Achieve Goals: Good    Frequency Min 2X/week     Co-evaluation               AM-PAC PT "6 Clicks" Mobility  Outcome Measure Help needed turning from your back to your side while in a flat bed without using bedrails?: A Little Help needed moving  from lying on your back to sitting on the side of a flat bed without using bedrails?: A Little Help needed moving to and from a bed to a chair (including a wheelchair)?: A Little Help needed standing up from a chair using your arms (e.g., wheelchair or bedside chair)?: A Little Help needed to walk in hospital room?: A Lot Help needed climbing 3-5 steps with a railing? : A Lot 6 Click Score: 16    End of Session Equipment Utilized During Treatment:  (sling RUE) Activity Tolerance: Patient tolerated treatment well;Patient limited by pain;Patient limited by fatigue Patient left: in chair;with call bell/phone within reach;with family/visitor present Nurse Communication: Mobility status PT Visit Diagnosis: Unsteadiness on feet (R26.81);Other abnormalities of gait and mobility (R26.89);Muscle weakness (generalized) (M62.81);History of falling (Z91.81)    Time: 1610-9604 PT Time Calculation (min) (ACUTE ONLY): 25 min   Charges:   PT Evaluation $PT Eval Moderate Complexity: 1 Mod PT Treatments $Therapeutic Activity: 23-37 mins PT General Charges $$ ACUTE PT VISIT: 1 Visit         2:19 PM, 01/08/24 Ocie Bob, MPT Physical Therapist with [facility] [phone] office [phone] mobile phone

## 2024-01-08 NOTE — ED Notes (Signed)
 Transition of Care [facility]) - Emergency Department Mini Assessment   Patient Details  Name: [name] MRN: [id number] Date of Birth: 1951/09/14  [facility] of [facility]) CM/SW Contact:    Beather Arbour Phone Number: 01/08/2024, 12:18 PM   Clinical Narrative:  This Clinical research associate spoke with patient at bedside. One of patient sons and his spouse was present. CSW assessed patient. Patient lives alone, has four children , and is independent. Patient states that she is still able to drive and have a rollator , walker, and 3N1 over her toilet. Patient desires to have HH but her children thinks its best for her to go to a SNF for Short-term rehab. Patient has had HH in the past. Family wants to know medically why patient is having so many falls and therapy recommendations.  CSW will follow patient and watch for therapy recommendation.   ED Mini Assessment: What brought you to the Emergency Department? : Falls  Barriers to Discharge: Continued Medical Work up  [employer] interventions: Pending PT/ OT recommendation  Means of departure: Not know  Interventions which prevented an admission or readmission: SNF Placement, Home Health Consult or Services    Patient Contact and Communications Key Contact 1: Sharen Hones 2: One of patient son Spoke with: Patient and one of sons at bedside Contact Date: 01/08/24,   Contact time: 1145 Contact Phone Number: At bedside Call outcome: At bedside  Patient states their goals for this hospitalization and ongoing recovery are:: DC home with [facility] CMS Medicare.gov Compare Post Acute Care list provided to:: Patient Choice offered to / list presented to : Patient  Admission diagnosis:  Fall, right shoulder injury Patient Active Problem List   Diagnosis Date Noted   Hyperosmolar hyperglycemic state (HHS) (HCC) 11/28/2023   Acute renal failure superimposed on stage 3b chronic kidney disease (HCC) 11/28/2023   Hypertensive urgency 11/28/2023   Stage  3b chronic kidney disease (HCC) 10/09/2023   Vertigo 05/13/2023   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   Unstable angina (HCC) 02/12/2023   Chest pain 03/26/2022   Nausea and vomiting 03/26/2022   Incomplete emptying of bladder 02/20/2022   Restless leg syndrome 01/09/2021   Insomnia due to medical condition 01/09/2021   Congestive heart failure (HCC) 11/30/2020   Depression, recurrent (HCC) 11/02/2020   Recurrent UTI 10/11/2020   Generalized weakness 10/11/2020   Recurrent falls 10/11/2020   QT prolongation 10/11/2020   Osteopenia after menopause 12/13/2017   H/O prolonged Q-T interval on ECG 03/06/2017   Vitamin B12 deficiency 01/13/2016   History of stroke 09/01/2015   History of TIA (transient ischemic attack) 08/14/2014   Uncontrolled type 2 diabetes mellitus with hyperglycemia, with long-term current use of insulin (HCC) 08/14/2014   Cataract 02/08/2014   Macular degeneration 02/08/2014   Accelerating angina (HCC) 01/07/2014   Generalized anxiety disorder 09/14/2013   Claudication (HCC) 06/18/2013   Gastroesophageal reflux disease without esophagitis 01/01/2012   Hypokalemia 08/24/2011   Hypertension associated with diabetes (HCC) 08/23/2011   PALPITATIONS 02/10/2010   Hyperlipidemia associated with type 2 diabetes mellitus (HCC) 05/10/2009   Coronary artery disease involving native coronary artery of native heart with unstable angina pectoris (HCC) 08/26/2008   PCP:  Gabriel Earing, FNP Pharmacy:   [facility] - [place], [place] - [address] [place] [place] [address] Phone: [phone] Fax: [phone]

## 2024-01-08 NOTE — Plan of Care (Signed)
  Problem: Acute Rehab PT Goals(only PT should resolve) Goal: Pt Will Go Supine/Side To Sit Outcome: Progressing Flowsheets (Taken 01/08/2024 1421) Pt will go Supine/Side to Sit: with supervision Goal: Patient Will Transfer Sit To/From Stand Outcome: Progressing Flowsheets (Taken 01/08/2024 1421) Patient will transfer sit to/from stand: with supervision Goal: Pt Will Transfer Bed To Chair/Chair To Bed Outcome: Progressing Flowsheets (Taken 01/08/2024 1421) Pt will Transfer Bed to Chair/Chair to Bed: with supervision Goal: Pt Will Ambulate Outcome: Progressing Flowsheets (Taken 01/08/2024 1421) Pt will Ambulate:  75 feet  with contact guard assist  with cane   2:21 PM, 01/08/24 Walton Guppy, MPT Physical Therapist with [facility] [phone] office [phone] mobile phone

## 2024-01-08 NOTE — ED Notes (Signed)
 This Clinical research associate spoke with patient at bedside regarding PT recommendations for SNF. Patient agreeable to SNF and her choices were [name], [name], and [name]; referral sent via HIB. TOC to follow.

## 2024-01-09 ENCOUNTER — Encounter (HOSPITAL_COMMUNITY): Payer: Self-pay

## 2024-01-09 ENCOUNTER — Other Ambulatory Visit: Payer: Self-pay

## 2024-01-09 LAB — CBG MONITORING, ED
Glucose-Capillary: 237 mg/dL — ABNORMAL HIGH (ref 70–99)
Glucose-Capillary: 378 mg/dL — ABNORMAL HIGH (ref 70–99)
Glucose-Capillary: 207 mg/dL — ABNORMAL HIGH (ref 70–99)
Glucose-Capillary: 112 mg/dL — ABNORMAL HIGH (ref 70–99)

## 2024-01-09 MED ORDER — ONDANSETRON HCL 4 MG/2ML IJ SOLN
4.0000 mg | Freq: Once | INTRAMUSCULAR | Status: DC
  Filled 2024-01-09: qty 2

## 2024-01-09 MED ORDER — ONDANSETRON 4 MG PO TBDP: 4.0000 mg | ORAL_TABLET | Freq: Once | ORAL | Status: DC

## 2024-01-09 MED ORDER — ONDANSETRON 4 MG PO TBDP
4.0000 mg | ORAL_TABLET | Freq: Once | ORAL | Status: AC
  Administered 2024-01-09: 4 mg via ORAL
  Filled 2024-01-09: qty 1

## 2024-01-09 NOTE — ED Notes (Signed)
 This Clinical research associate spoke with patient at bedside regarding bed offers. Patient agreeable to [facility] rehab if they have a private room. This Clinical research associate called Margretta Shi while still at bedside with patient. Margretta Shi confirmed having a private room. Patient asked for writer to call her son Merlyn Starring since Silvester Drown started a new job today. This Clinical research associate called Merlyn Starring and had to leave a Dance movement psychotherapist. Health team advantage auth started today. TOC to follow

## 2024-01-09 NOTE — ED Notes (Signed)
 Nausea completely subsided.

## 2024-01-09 NOTE — Evaluation (Signed)
 Occupational Therapy Evaluation Patient Details Name: [name] MRN: [id number] DOB: 1951-09-23 Today's Date: 01/09/2024   History of Present Illness   [name]. [place] is a 73 y/o female with history of frequent falls on Plavix. Last fall last week hit left side of head, bruised with HA since. Fall this morning resulted in rt shoulder pain with "pop." Pt denies LOC.     Clinical Impressions Pt agreeable to OT evaluation. Pt's R UE in sling with NWB status. L UE generally weak with good functional use. Min A for step pivot transfer to chair without AD with posterior lean. More CGA with RW but assist needed to move RW since pt could only use L UE. Pt reports having 1 fall a week at home. Mod to max A for lower body ADL's at this time and near mod for upper body due to R UE in sling. Pt left in the chair with call bell within reach. Pt will benefit from continued OT in the hospital and recommended venue below to increase strength, balance, and endurance for safe ADL's.        If plan is discharge home, recommend the following:   A little help with walking and/or transfers;A lot of help with bathing/dressing/bathroom;Assistance with cooking/housework;Assist for transportation;Help with stairs or ramp for entrance     Functional Status Assessment   Patient has had a recent decline in their functional status and demonstrates the ability to make significant improvements in function in a reasonable and predictable amount of time.     Equipment Recommendations   None recommended by OT             Precautions/Restrictions   Precautions Precautions: Fall Recall of Precautions/Restrictions: Intact Required Braces or Orthoses: Sling Restrictions Weight Bearing Restrictions Per Provider Order: Yes RUE Weight Bearing Per Provider Order: Non weight bearing     Mobility Bed Mobility Overal bed mobility: Needs Assistance Bed Mobility: Supine to Sit     Supine to sit: HOB  elevated, Min assist     General bed mobility comments: increased time, labored movement    Transfers Overall transfer level: Needs assistance Equipment used: Rolling walker (2 wheels) Transfers: Sit to/from Stand, Bed to chair/wheelchair/BSC Sit to Stand: Contact guard assist, Min assist     Step pivot transfers: Contact guard assist, Min assist     General transfer comment: Min A without RW; more CGA with RW other than assist to manipulate the RW into place.      Balance Overall balance assessment: Needs assistance Sitting-balance support: Feet unsupported, No upper extremity supported Sitting balance-Leahy Scale: Fair Sitting balance - Comments: seated at EOB   Standing balance support: During functional activity, Single extremity supported Standing balance-Leahy Scale: Fair Standing balance comment: fair using L UE on RW                           ADL either performed or assessed with clinical judgement   ADL Overall ADL's : Needs assistance/impaired Eating/Feeding: Set up;Sitting   Grooming: Moderate assistance;Sitting   Upper Body Bathing: Moderate assistance;Maximal assistance;Sitting   Lower Body Bathing: Moderate assistance;Maximal assistance;Sitting/lateral leans   Upper Body Dressing : Moderate assistance;Sitting   Lower Body Dressing: Moderate assistance;Maximal assistance;Sitting/lateral leans Lower Body Dressing Details (indicate cue type and reason): Pt able to doff one sock but required assist to don seated at EOB Toilet Transfer: Contact guard assist;Minimal assistance;Stand-pivot;Ambulation;Rolling walker (2 wheels) Toilet Transfer Details (indicate  cue type and reason): Simulated via EOB to chair transfer without AD with pt needing min A with posterior lean noted; Able to ambualte with RW with assist to manage RW; cane would be better but RW was all that was available in ED Toileting- Clothing Manipulation and Hygiene: Moderate  assistance;Maximal assistance;Sitting/lateral lean       Functional mobility during ADLs: Contact guard assist;Minimal assistance;Rolling walker (2 wheels) General ADL Comments: Able to ambulate just outside the door and back with RW and assist to propel RW since pt can only use L UE.     Vision Baseline Vision/History: 1 Wears glasses Ability to See in Adequate Light: 1 Impaired Patient Visual Report: No change from baseline Vision Assessment?: No apparent visual deficits     Perception Perception: Not tested       Praxis Praxis: Not tested       Pertinent Vitals/Pain Pain Assessment Pain Assessment: Faces Faces Pain Scale: Hurts a little bit Pain Location: gluteal region Pain Descriptors / Indicators: Discomfort Pain Intervention(s): Repositioned, Monitored during session     Extremity/Trunk Assessment Upper Extremity Assessment Upper Extremity Assessment: RUE deficits/detail;LUE deficits/detail RUE Deficits / Details: NWB in sling LUE Deficits / Details: General weakness but good functional use.   Lower Extremity Assessment Lower Extremity Assessment: Defer to PT evaluation   Cervical / Trunk Assessment Cervical / Trunk Assessment: Normal   Communication Communication Communication: No apparent difficulties   Cognition Arousal: Alert Behavior During Therapy: WFL for tasks assessed/performed Cognition: No apparent impairments                               Following commands: Intact       Cueing  General Comments   Cueing Techniques: Verbal cues;Tactile cues                 Home Living Family/patient expects to be discharged to:: Private residence Living Arrangements: Alone Available Help at Discharge: Family;Available PRN/intermittently Type of Home: Apartment Home Access: Elevator     Home Layout: One level     Bathroom Shower/Tub: Chief Strategy Officer: Handicapped height Bathroom Accessibility: Yes   Home  Equipment: Cane - single point;Rollator (4 wheels);Shower seat   Additional Comments: per chart and pt report      Prior Functioning/Environment Prior Level of Function : Independent/Modified Independent;Driving             Mobility Comments: Community ambulation using SPC PRN, drives ADLs Comments: Independent    OT Problem List: Decreased strength;Decreased activity tolerance;Impaired balance (sitting and/or standing);Impaired UE functional use   OT Treatment/Interventions: Self-care/ADL training;Therapeutic exercise;DME and/or AE instruction;Therapeutic activities;Patient/family education;Balance training      OT Goals(Current goals can be found in the care plan section)   Acute Rehab OT Goals Patient Stated Goal: improve function OT Goal Formulation: With patient Time For Goal Achievement: 01/23/24 Potential to Achieve Goals: Good   OT Frequency:  Min 2X/week                  AM-PAC OT "6 Clicks" Daily Activity     Outcome Measure Help from another person eating meals?: A Little Help from another person taking care of personal grooming?: A Little Help from another person toileting, which includes using toliet, bedpan, or urinal?: A Lot Help from another person bathing (including washing, rinsing, drying)?: A Lot Help from another person to put on and taking off regular upper body  clothing?: A Lot Help from another person to put on and taking off regular lower body clothing?: A Lot 6 Click Score: 14   End of Session Equipment Utilized During Treatment: Rolling walker (2 wheels);Gait belt Nurse Communication: Other (comment) (notified pt was in the chair)  Activity Tolerance: Patient tolerated treatment well Patient left: in chair;with call bell/phone within reach  OT Visit Diagnosis: Unsteadiness on feet (R26.81);Other abnormalities of gait and mobility (R26.89);Muscle weakness (generalized) (M62.81);Repeated falls (R29.6);History of falling (Z91.81)                 Time: 1610-9604 OT Time Calculation (min): 13 min Charges:  OT General Charges $OT Visit: 1 Visit OT Evaluation $OT Eval Low Complexity: 1 Low  Cassey Hurrell OT, MOT  Thurnell Floss 01/09/2024, 9:51 AM

## 2024-01-09 NOTE — ED Provider Notes (Signed)
 Emergency Medicine Observation Re-evaluation Note  [name] is a 73 y.o. female, seen on rounds today.  Pt initially presented to the ED for complaints of Fall (Mechanical fall from standing position backwards. Second fall this morning.) Currently, the patient is awaiting nursing home placement.  Physical Exam  BP (!) 171/58   Pulse 68   Temp 97.8 F (36.6 C) (Oral)   Resp 15   Ht 5\' 6"  (1.676 m)   Wt 74.8 kg   SpO2 97%   BMI 26.63 kg/m  Physical Exam Alert in no acute distress  ED Course / MDM  EKG:   I have reviewed the labs performed to date as well as medications administered while in observation.  Recent changes in the last 24 hours include none.  Plan  Current plan is for nursing home placement.    Cheyenne Cotta, MD 01/09/24 [phone]

## 2024-01-09 NOTE — Inpatient Diabetes Management (Signed)
 Inpatient Diabetes Program Recommendations  AACE/ADA: New Consensus Statement on Inpatient Glycemic Control   Target Ranges:  Prepandial:   less than 140 mg/dL      Peak postprandial:   less than 180 mg/dL (1-2 hours)      Critically ill patients:  140 - 180 mg/dL    Latest Reference Range & Units 01/08/24 09:05 01/08/24 14:43 01/08/24 16:47 01/08/24 21:07 01/09/24 08:22 01/09/24 13:42  Glucose-Capillary 70 - 99 mg/dL 409 (H) 811 (H) 914 (H) 265 (H) 237 (H) 378 (H)    Review of Glycemic Control  Diabetes history: DM2 Outpatient Diabetes medications: Tresiba 60 units if eating (takes 30 units if not eating), Novolog 25-36 units TID with meals, Ozempic 2 mg Qweek Current orders for Inpatient glycemic control: Novolog 0-15 units TID with meals, Novolog 0-5 units QHS  Inpatient Diabetes Program Recommendations:    Insulin: CBG up to 378 mg/dl at 78:29 today.  Please consider ordering Semglee 20 units Q24H.  Thanks, Beacher Limerick, RN, MSN, CDCES Diabetes Coordinator Inpatient Diabetes Program [phone] (Team Pager from 8am to 5pm)

## 2024-01-09 NOTE — Plan of Care (Signed)
  Problem: Acute Rehab OT Goals (only OT should resolve) Goal: Pt. Will Perform Grooming Flowsheets (Taken 01/09/2024 0953) Pt Will Perform Grooming:  with contact guard assist  sitting Goal: Pt. Will Perform Upper Body Bathing Flowsheets (Taken 01/09/2024 0953) Pt Will Perform Upper Body Bathing:  with contact guard assist  sitting Goal: Pt. Will Perform Upper Body Dressing Flowsheets (Taken 01/09/2024 0953) Pt Will Perform Upper Body Dressing:  with contact guard assist  sitting Goal: Pt. Will Perform Lower Body Dressing Flowsheets (Taken 01/09/2024 0953) Pt Will Perform Lower Body Dressing:  with min assist  sitting/lateral leans Goal: Pt. Will Transfer To Toilet Flowsheets (Taken 01/09/2024 [phone]) Pt Will Transfer to Toilet: with modified independence Goal: Pt. Will Perform Toileting-Clothing Manipulation Flowsheets (Taken 01/09/2024 0953) Pt Will Perform Toileting - Clothing Manipulation and hygiene:  with contact guard assist  sitting/lateral leans Goal: Pt/Caregiver Will Perform Home Exercise Program Flowsheets (Taken 01/09/2024 [phone]) Pt/caregiver will Perform Home Exercise Program:  Increased strength  Left upper extremity  Independently  Nivedita Mirabella OT, MOT

## 2024-01-09 NOTE — ED Notes (Signed)
 Pt vomiting, with complaints of nausea. Sudden onset. Nausea and vomiting subsided organically after 5-7 minutes. MD notified. V/S reassessed.

## 2024-01-09 NOTE — Discharge Instructions (Addendum)
 You were seen for broken collarbone (clavicle) in the emergency department.   At home, please ice your collarbone and use tylenol for pain. Use oxycodone for any breakthrough pain.    Check your MyChart online for the results of any tests that had not resulted by the time you left the emergency department.   Follow-up with orthopedics (Dr Phyllis Breeze) in 1-2 weeks regarding your broken clavicle.   Return immediately to the emergency department if you experience any of the following: worsening pain, or any other concerning symptoms.    Thank you for visiting our Emergency Department. It was a pleasure taking care of you today.

## 2024-01-10 DIAGNOSIS — I2510 Atherosclerotic heart disease of native coronary artery without angina pectoris: Secondary | ICD-10-CM | POA: Diagnosis not present

## 2024-01-10 DIAGNOSIS — E1165 Type 2 diabetes mellitus with hyperglycemia: Secondary | ICD-10-CM | POA: Diagnosis not present

## 2024-01-10 DIAGNOSIS — R569 Unspecified convulsions: Secondary | ICD-10-CM | POA: Diagnosis not present

## 2024-01-10 DIAGNOSIS — E785 Hyperlipidemia, unspecified: Secondary | ICD-10-CM | POA: Diagnosis not present

## 2024-01-10 DIAGNOSIS — F339 Major depressive disorder, recurrent, unspecified: Secondary | ICD-10-CM | POA: Diagnosis not present

## 2024-01-10 DIAGNOSIS — E538 Deficiency of other specified B group vitamins: Secondary | ICD-10-CM | POA: Diagnosis not present

## 2024-01-10 DIAGNOSIS — I10 Essential (primary) hypertension: Secondary | ICD-10-CM | POA: Diagnosis not present

## 2024-01-10 DIAGNOSIS — R296 Repeated falls: Secondary | ICD-10-CM | POA: Diagnosis not present

## 2024-01-10 DIAGNOSIS — I509 Heart failure, unspecified: Secondary | ICD-10-CM | POA: Diagnosis not present

## 2024-01-10 DIAGNOSIS — Z7985 Long-term (current) use of injectable non-insulin antidiabetic drugs: Secondary | ICD-10-CM | POA: Diagnosis not present

## 2024-01-10 DIAGNOSIS — Z7982 Long term (current) use of aspirin: Secondary | ICD-10-CM | POA: Diagnosis not present

## 2024-01-10 DIAGNOSIS — R4781 Slurred speech: Secondary | ICD-10-CM | POA: Diagnosis not present

## 2024-01-10 DIAGNOSIS — I6523 Occlusion and stenosis of bilateral carotid arteries: Secondary | ICD-10-CM | POA: Diagnosis not present

## 2024-01-10 DIAGNOSIS — M6281 Muscle weakness (generalized): Secondary | ICD-10-CM | POA: Diagnosis not present

## 2024-01-10 DIAGNOSIS — M4802 Spinal stenosis, cervical region: Secondary | ICD-10-CM | POA: Diagnosis not present

## 2024-01-10 DIAGNOSIS — R2689 Other abnormalities of gait and mobility: Secondary | ICD-10-CM | POA: Diagnosis not present

## 2024-01-10 DIAGNOSIS — W19XXXD Unspecified fall, subsequent encounter: Secondary | ICD-10-CM | POA: Diagnosis not present

## 2024-01-10 DIAGNOSIS — S0990XD Unspecified injury of head, subsequent encounter: Secondary | ICD-10-CM | POA: Diagnosis not present

## 2024-01-10 DIAGNOSIS — M19011 Primary osteoarthritis, right shoulder: Secondary | ICD-10-CM | POA: Diagnosis not present

## 2024-01-10 DIAGNOSIS — Z955 Presence of coronary angioplasty implant and graft: Secondary | ICD-10-CM | POA: Diagnosis not present

## 2024-01-10 DIAGNOSIS — R1319 Other dysphagia: Secondary | ICD-10-CM | POA: Diagnosis not present

## 2024-01-10 DIAGNOSIS — G4489 Other headache syndrome: Secondary | ICD-10-CM | POA: Diagnosis not present

## 2024-01-10 DIAGNOSIS — R29818 Other symptoms and signs involving the nervous system: Secondary | ICD-10-CM | POA: Diagnosis present

## 2024-01-10 DIAGNOSIS — Z951 Presence of aortocoronary bypass graft: Secondary | ICD-10-CM | POA: Diagnosis not present

## 2024-01-10 DIAGNOSIS — S42021D Displaced fracture of shaft of right clavicle, subsequent encounter for fracture with routine healing: Secondary | ICD-10-CM | POA: Diagnosis not present

## 2024-01-10 DIAGNOSIS — Z79899 Other long term (current) drug therapy: Secondary | ICD-10-CM | POA: Diagnosis not present

## 2024-01-10 DIAGNOSIS — Z8673 Personal history of transient ischemic attack (TIA), and cerebral infarction without residual deficits: Secondary | ICD-10-CM | POA: Diagnosis not present

## 2024-01-10 DIAGNOSIS — R262 Difficulty in walking, not elsewhere classified: Secondary | ICD-10-CM | POA: Diagnosis not present

## 2024-01-10 DIAGNOSIS — R519 Headache, unspecified: Secondary | ICD-10-CM | POA: Diagnosis not present

## 2024-01-10 DIAGNOSIS — E782 Mixed hyperlipidemia: Secondary | ICD-10-CM | POA: Diagnosis not present

## 2024-01-10 DIAGNOSIS — S42009A Fracture of unspecified part of unspecified clavicle, initial encounter for closed fracture: Secondary | ICD-10-CM | POA: Diagnosis not present

## 2024-01-10 DIAGNOSIS — W19XXXA Unspecified fall, initial encounter: Secondary | ICD-10-CM | POA: Diagnosis not present

## 2024-01-10 DIAGNOSIS — Z794 Long term (current) use of insulin: Secondary | ICD-10-CM | POA: Diagnosis not present

## 2024-01-10 DIAGNOSIS — S42021A Displaced fracture of shaft of right clavicle, initial encounter for closed fracture: Secondary | ICD-10-CM | POA: Diagnosis not present

## 2024-01-10 DIAGNOSIS — Z7902 Long term (current) use of antithrombotics/antiplatelets: Secondary | ICD-10-CM | POA: Diagnosis not present

## 2024-01-10 DIAGNOSIS — E119 Type 2 diabetes mellitus without complications: Secondary | ICD-10-CM | POA: Diagnosis not present

## 2024-01-10 LAB — CBG MONITORING, ED
Glucose-Capillary: 236 mg/dL — ABNORMAL HIGH (ref 70–99)
Glucose-Capillary: 232 mg/dL — ABNORMAL HIGH (ref 70–99)

## 2024-01-10 NOTE — TOC Transition Note (Addendum)
 Transition of Care [facility]) - Discharge Note   Patient Details  Name: [name] MRN: [id number] Date of Birth: [birth year]  Transition [facility]) CM/SW Contact:  Orelia Binet, RN Phone Number: 01/10/2024, 2:58 PM   Clinical Narrative:   Patient discharging to [facility], RN calling report to [phone], room 103. ED will schedule transport. New FL2 signed and sent in the Hub.    Final next level of care: Skilled Nursing Facility Barriers to Discharge: Barriers Resolved   Patient Goals and CMS Choice Patient states their goals for this hospitalization and ongoing recovery are:: Going to SNF CMS Medicare.gov Compare Post Acute Care list provided to:: Patient Choice offered to / list presented to : Patient      Discharge Placement                Patient to be transferred to facility by: EMS Name of family member notified: son Patient and family notified of of transfer: 01/10/24  Discharge Plan and Services Additional resources added to the After Visit Summary for         Social Drivers of Health (SDOH) Interventions SDOH Screenings   Food Insecurity: No Food Insecurity (11/29/2023)  Housing: Low Risk  (11/29/2023)  Transportation Needs: No Transportation Needs (11/29/2023)  Utilities: Not At Risk (11/29/2023)  Alcohol Screen: Low Risk  (01/24/2023)  Depression (PHQ2-9): Low Risk  (10/09/2023)  Recent Concern: Depression (PHQ2-9) - Medium Risk (08/27/2023)  Financial Resource Strain: Low Risk  (01/24/2023)  Physical Activity: Insufficiently Active (01/24/2023)  Social Connections: Moderately Isolated (11/29/2023)  Stress: No Stress Concern Present (01/24/2023)  Tobacco Use: Low Risk  (01/09/2024)

## 2024-01-10 NOTE — ED Provider Notes (Signed)
 Emergency Medicine Observation Re-evaluation Note  [name] is a 73 y.o. female, seen on rounds today.  Pt initially presented to the ED for complaints of Fall (Mechanical fall from standing position backwards. Second fall this morning.) Currently, the patient is resting.  Physical Exam  BP (!) 185/77   Pulse 74   Temp 98 F (36.7 C)   Resp 16   Ht 5\' 6"  (1.676 m)   Wt 74.8 kg   SpO2 96%   BMI 26.63 kg/m  Physical Exam General: nad Lungs: no distress Psych: calm  ED Course / MDM  EKG:EKG Interpretation Date/Time:  Thursday January 09 2024 15:38:34 EDT Ventricular Rate:  80 PR Interval:  179 QRS Duration:  88 QT Interval:  409 QTC Calculation: 472 R Axis:   -44  Text Interpretation: Sinus rhythm Abnormal R-wave progression, late transition Inferior infarct, old Confirmed by Annita Kindle [phone]) on 01/09/2024 5:45:32 PM  I have reviewed the labs performed to date as well as medications administered while in observation.  Recent changes in the last 24 hours include plan for placement today .  Plan  Current plan is for placement.    Teddi Favors, DO 01/10/24 1540

## 2024-01-10 NOTE — ED Notes (Signed)
 Stand by assist to bedside commode. Void x1. Adjusted bedsheets and assisted back into the bed. Gave ice water , cell, and room phones. Denied any other needs.

## 2024-01-10 NOTE — ED Notes (Addendum)
 CSW received a call from [employer] ths morning , patient insurance company regarding Auth. [employer] medically doctor is requesting a peer to peer to make final decision on if patient is appropriate for SNF today by 2:00pm. MD was provided with peer to peer information. [employer] Medical doctor is Alfornia Anis and number is [phone]. TOC will continue to follow.    Addendum 9:45 AM   MD shared that he spoke with Dr. Luster Salters and Dr. Luster Salters spoke with son . Son apparently is not sure about wanting patient in a nursing home. MD stated that CSW needed to talk with son . CSW has already spoken to spoke sons regarding nursing home because it is only for short-term rehab. MD stated that he would not make any more calls about patient disposition and trying to care for emergency patients. CSW is wait on decision from insurance.

## 2024-01-10 NOTE — Inpatient Diabetes Management (Signed)
 Inpatient Diabetes Program Recommendations  AACE/ADA: New Consensus Statement on Inpatient Glycemic Control (2015)  Target Ranges:  Prepandial:   less than 140 mg/dL      Peak postprandial:   less than 180 mg/dL (1-2 hours)      Critically ill patients:  140 - 180 mg/dL   Lab Results  Component Value Date   GLUCAP 236 (H) 01/10/2024   HGBA1C 9.1 (H) 11/28/2023    Review of Glycemic Control  Latest Reference Range & Units 01/09/24 08:22 01/09/24 13:42 01/09/24 16:09 01/09/24 21:19 01/10/24 07:57  Glucose-Capillary 70 - 99 mg/dL 762 (H) 621 (H) 792 (H) 112 (H) 236 (H)  (H): Data is abnormally high  Diabetes history: DM2 Outpatient Diabetes medications: Tresiba  60 units if eating (takes 30 units if not eating), Novolog  25-36 units TID with meals, Ozempic  2 mg Qweek Current orders for Inpatient glycemic control: Novolog  0-15 units TID with meals, Novolog  0-5 units QHS  Inpatient Diabetes Program Recommendations:    Please consider Semglee  20 units every day  Will continue to follow while inpatient.  Thank you, Wyvonna Pinal, MSN, CDCES Diabetes Coordinator Inpatient Diabetes Program [phone] (team pager from 8a-5p)

## 2024-01-10 NOTE — NC FL2 (Signed)
 [place]  MEDICAID FL2 LEVEL OF CARE FORM     IDENTIFICATION  Patient Name: [name] Birthdate: 01/17/1951 Sex: female Admission Date (Current Location): 01/08/2024  [facility] and [place] Number:  [employer] and Address:  [facility],  [address] [place] [address]      Provider Number: [id number]  Attending Physician Name and Address:  Cheyenne Cotta MD  Relative Name and Phone Number:  Ether Hercules [phone]    Current Level of Care:  Hospital Recommended Level of Care: Skilled Nursing Facility Prior Approval Number:    Date Approved/Denied:   PASRR Number: Pending  Discharge Plan: SNF    Current Diagnoses: Patient Active Problem List   Diagnosis Date Noted   Hyperosmolar hyperglycemic state (HHS) (HCC) 11/28/2023   Acute renal failure superimposed on stage 3b chronic kidney disease (HCC) 11/28/2023   Hypertensive urgency 11/28/2023   Stage 3b chronic kidney disease (HCC) 10/09/2023   Vertigo 05/13/2023   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   Unstable angina (HCC) 02/12/2023   Chest pain 03/26/2022   Nausea and vomiting 03/26/2022   Incomplete emptying of bladder 02/20/2022   Restless leg syndrome 01/09/2021   Insomnia due to medical condition 01/09/2021   Congestive heart failure (HCC) 11/30/2020   Depression, recurrent (HCC) 11/02/2020   Recurrent UTI 10/11/2020   Generalized weakness 10/11/2020   Recurrent falls 10/11/2020   QT prolongation 10/11/2020   Osteopenia after menopause 12/13/2017   H/O prolonged Q-T interval on ECG 03/06/2017   Vitamin B12 deficiency 01/13/2016   History of stroke 09/01/2015   History of TIA (transient ischemic attack) 08/14/2014   Uncontrolled type 2 diabetes mellitus with hyperglycemia, with long-term current use of insulin  (HCC) 08/14/2014   Cataract 02/08/2014   Macular degeneration 02/08/2014   Accelerating angina (HCC) 01/07/2014   Generalized anxiety disorder 09/14/2013   Claudication (HCC)  06/18/2013   Gastroesophageal reflux disease without esophagitis 01/01/2012   Hypokalemia 08/24/2011   Hypertension associated with diabetes (HCC) 08/23/2011   PALPITATIONS 02/10/2010   Hyperlipidemia associated with type 2 diabetes mellitus (HCC) 05/10/2009   Coronary artery disease involving native coronary artery of native heart with unstable angina pectoris (HCC) 08/26/2008    Orientation RESPIRATION BLADDER Height & Weight     Self, Time, Situation, Place  Normal Continent Weight: 165 lb (74.8 kg) Height:  5\' 6"  (167.6 cm)  BEHAVIORAL SYMPTOMS/MOOD NEUROLOGICAL BOWEL NUTRITION STATUS      Continent Diet  AMBULATORY STATUS COMMUNICATION OF NEEDS Skin   Extensive Assist Verbally Bruising (Left side of head and different places on body)                       Personal Care Assistance Level of Assistance  Bathing, Feeding, Dressing Bathing Assistance: Limited assistance Feeding assistance: Independent Dressing Assistance: Limited assistance     Functional Limitations Info  Sight, Hearing, Speech Sight Info: Impaired Financial trader) Hearing Info: Adequate Speech Info: Adequate    SPECIAL CARE FACTORS FREQUENCY  PT (By licensed PT), OT (By licensed OT)     PT Frequency: 5 x a week OT Frequency: 5 x a week            Contractures Contractures Info: Not present    Additional Factors Info  Code Status Code Status Info: FULL             Current Medications (01/10/2024):  This is the current hospital active medication list Current Facility-Administered Medications  Medication Dose  Route Frequency Provider Last Rate Last Admin   acetaminophen  (TYLENOL ) tablet 650 mg  650 mg Oral Q4H PRN Paterson, Robert C, MD   650 mg at 01/09/24 1344   aspirin  chewable tablet 81 mg  81 mg Oral Daily Paterson, Robert C, MD   81 mg at 01/10/24 1610   clopidogrel  (PLAVIX ) tablet 75 mg  75 mg Oral Q breakfast Ninetta Basket, MD   75 mg at 01/10/24 9604   furosemide  (LASIX )  tablet 20 mg  20 mg Oral Daily Paterson, Robert C, MD   20 mg at 01/10/24 0845   insulin  aspart (novoLOG ) injection 0-15 Units  0-15 Units Subcutaneous TID WC Paterson, Robert C, MD   5 Units at 01/10/24 1248   insulin  aspart (novoLOG ) injection 0-5 Units  0-5 Units Subcutaneous QHS Paterson, Robert C, MD   3 Units at 01/08/24 2114   lidocaine  (LIDODERM ) 5 % 1 patch  1 patch Transdermal Q24H Ninetta Basket, MD   1 patch at 01/10/24 1250   lisinopril  (ZESTRIL ) tablet 10 mg  10 mg Oral Daily Ninetta Basket, MD   10 mg at 01/10/24 5409   loratadine  (CLARITIN ) tablet 10 mg  10 mg Oral Daily Paterson, Robert C, MD   10 mg at 01/10/24 8119   metoprolol  tartrate (LOPRESSOR ) tablet 50 mg  50 mg Oral BID Paterson, Robert C, MD   50 mg at 01/10/24 0845   oxyCODONE  (Oxy IR/ROXICODONE ) immediate release tablet 5 mg  5 mg Oral Q3H PRN Paterson, Robert C, MD   5 mg at 01/09/24 1345   potassium chloride  (KLOR-CON  M) CR tablet 10 mEq  10 mEq Oral Daily Paterson, Robert C, MD   10 mEq at 01/10/24 1478   rOPINIRole  (REQUIP ) tablet 1 mg  1 mg Oral Daily PRN Paterson, Robert C, MD       rosuvastatin  (CRESTOR ) tablet 20 mg  20 mg Oral Daily Ninetta Basket, MD   20 mg at 01/10/24 2956   sertraline  (ZOLOFT ) tablet 50 mg  50 mg Oral Daily Paterson, Robert C, MD   50 mg at 01/10/24 2130   traZODone  (DESYREL ) tablet 50 mg  50 mg Oral QHS PRN Paterson, Robert C, MD   50 mg at 01/08/24 2114   Current Outpatient Medications  Medication Sig Dispense Refill   aspirin  81 MG chewable tablet Chew 1 tablet (81 mg total) by mouth daily. 90 tablet 3   aspirin -acetaminophen -caffeine (EXCEDRIN MIGRAINE) 250-250-65 MG tablet Take 1 tablet by mouth every 6 (six) hours as needed for headache.     clopidogrel  (PLAVIX ) 75 MG tablet Take 1 tablet (75 mg total) by mouth daily with breakfast. 90 tablet 3   diphenhydrAMINE  (BENADRYL ) 25 mg capsule Take 25 mg by mouth every 6 (six) hours as needed for allergies.     furosemide   (LASIX ) 20 MG tablet Take 1 tablet (20 mg total) by mouth daily. 90 tablet 1   insulin  aspart (NOVOLOG  FLEXPEN) 100 UNIT/ML FlexPen Inject 36 Units into the skin 3 (three) times daily with meals. (Patient taking differently: Inject 25-36 Units into the skin 3 (three) times daily with meals.)     insulin  degludec (TRESIBA ) 200 UNIT/ML FlexTouch Pen Inject 30-60 Units into the skin daily. Pt states she takes 60 units if she eats and 30 units if she does not eat     levocetirizine (XYZAL ) 5 MG tablet Take 0.5 tablets (2.5 mg total) by mouth every evening. 90 tablet 3   lisinopril  (  ZESTRIL ) 10 MG tablet Take 1 tablet (10 mg total) by mouth daily. 90 tablet 3   metoprolol  tartrate (LOPRESSOR ) 25 MG tablet Take 2 tablets (50 mg total) by mouth 2 (two) times daily. 60 tablet 2   nitroGLYCERIN  (NITROSTAT ) 0.4 MG SL tablet DISSOLVE 1 TABLET UNDER TONGUE FOR CHESTPAIN.MAY REPEAT EVERY 5 MINUTES FOR 3 DOSES.IF NO RELIEF CALL 911 OR GO TO ER 25 tablet 3   potassium chloride  (KLOR-CON ) 10 MEQ tablet TAKE ONE (1) TABLET BY MOUTH TWO (2) TIMES DAILY (Patient taking differently: Take 10 mEq by mouth daily.) 180 tablet 0   rOPINIRole  (REQUIP ) 0.5 MG tablet Take 2 tablets (1 mg total) by mouth daily as needed (restless leg).     rosuvastatin  (CRESTOR ) 20 MG tablet TAKE ONE (1) TABLET BY MOUTH EVERY DAY 90 tablet 0   sertraline  (ZOLOFT ) 50 MG tablet Take 1 tablet (50 mg total) by mouth daily. 90 tablet 3   traZODone  (DESYREL ) 50 MG tablet Take 1 tablet (50 mg total) by mouth at bedtime as needed for sleep. 90 tablet 3   Blood Glucose Monitoring Suppl DEVI 1 each by Does not apply route in the morning, at noon, and at bedtime. May substitute to any manufacturer covered by patient's insurance. 1 each 0   Continuous Glucose Sensor (FREESTYLE LIBRE 3 SENSOR) MISC PLACE 1 SENSOR ON THE SKIN EVERY 14 DAYS 6 each 3   Semaglutide , 2 MG/DOSE, 8 MG/3ML SOPN Inject 2 mg as directed once a week. (Patient not taking: Reported on  01/08/2024)       Discharge Medications: Please see after visit summary for a list of discharge medications.  Relevant Imaging Results:  Relevant Lab Results:   Additional Information SSN: [id number]  Cyndie Dredge, [place]

## 2024-01-10 NOTE — Progress Notes (Signed)
 Physical Therapy Treatment Patient Details Name: [name] MRN: [id number] DOB: 06-06-1951 Today's Date: 01/10/2024   History of Present Illness [name] is a 73 y/o female with history of frequent falls on Plavix . Last fall last week hit left side of head, bruised with HA since. Fall this morning resulted in rt shoulder pain with pop. Pt denies LOC.    PT Comments  Patient demonstrates slow labored movement for sitting up at bedside with c/o increased RUE pain and mild dizziness, unsteady on feet and limited for gait training mostly due to fatigue and drop in BP upon standing. Patient able to ambulate to bathroom and transferred to/from commode with Min assist. Patient tolerated sitting up in chair after therapy. Patient's orthostatic BP's as follows: lying 165/75, sitting 159/73, standing 81/53 - RN notified. Patient will benefit from continued skilled physical therapy in hospital and recommended venue below to increase strength, balance, endurance for safe ADLs and gait.       If plan is discharge home, recommend the following: A little help with walking and/or transfers;A little help with bathing/dressing/bathroom;Assistance with cooking/housework;Help with stairs or ramp for entrance   Can travel by private vehicle     Yes  Equipment Recommendations  None recommended by PT    Recommendations for Other Services       Precautions / Restrictions Precautions Precautions: Fall Recall of Precautions/Restrictions: Intact Required Braces or Orthoses: Sling Restrictions Weight Bearing Restrictions Per Provider Order: Yes RUE Weight Bearing Per Provider Order: Non weight bearing     Mobility  Bed Mobility Overal bed mobility: Needs Assistance Bed Mobility: Supine to Sit     Supine to sit: Mod assist     General bed mobility comments: slow labored movement with increased right shoulder pain    Transfers Overall transfer level: Needs assistance Equipment used:  Rolling walker (2 wheels), 1 person hand held assist Transfers: Sit to/from Stand, Bed to chair/wheelchair/BSC Sit to Stand: Min assist   Step pivot transfers: Min assist, Contact guard assist       General transfer comment: slow labored movement with c/o mild dizziness    Ambulation/Gait Ambulation/Gait assistance: Min assist, Mod assist Gait Distance (Feet): 20 Feet Assistive device: Rolling walker (2 wheels), 1 person hand held assist Gait Pattern/deviations: Decreased step length - right, Decreased step length - left, Decreased stride length Gait velocity: decreased     General Gait Details: slow labored movement using LUE to hold onto RW and hand held assist for safety   Stairs             Wheelchair Mobility     Tilt Bed    Modified Rankin (Stroke Patients Only)       Balance Overall balance assessment: Needs assistance Sitting-balance support: Feet unsupported, No upper extremity supported Sitting balance-Leahy Scale: Fair Sitting balance - Comments: fair/good seated at EOB   Standing balance support: Single extremity supported, During functional activity Standing balance-Leahy Scale: Fair Standing balance comment: using Left hand on RW                            Communication Communication Communication: No apparent difficulties  Cognition Arousal: Alert Behavior During Therapy: WFL for tasks assessed/performed   PT - Cognitive impairments: No apparent impairments                         Following commands: Intact  Cueing Cueing Techniques: Verbal cues, Tactile cues  Exercises General Exercises - Lower Extremity Long Arc Quad: AROM, Strengthening, Both, 10 reps, Seated Hip Flexion/Marching: AROM, Strengthening, Both, 10 reps, Seated Toe Raises: AAROM, Both, 10 reps, Seated Heel Raises: AROM, Strengthening, Both, 10 reps, Seated    General Comments        Pertinent Vitals/Pain Pain Assessment Pain Assessment:  Faces Faces Pain Scale: Hurts even more Pain Location: R shoulder Pain Descriptors / Indicators: Guarding, Grimacing, Sharp Pain Intervention(s): Limited activity within patient's tolerance, Monitored during session, Repositioned    Home Living                          Prior Function            PT Goals (current goals can now be found in the care plan section) Acute Rehab PT Goals Patient Stated Goal: Return home with family to assist PT Goal Formulation: With patient Time For Goal Achievement: 01/22/24 Potential to Achieve Goals: Good Progress towards PT goals: Progressing toward goals    Frequency    Min 2X/week      PT Plan      Co-evaluation              AM-PAC PT 6 Clicks Mobility   Outcome Measure  Help needed turning from your back to your side while in a flat bed without using bedrails?: A Little Help needed moving from lying on your back to sitting on the side of a flat bed without using bedrails?: A Little Help needed moving to and from a bed to a chair (including a wheelchair)?: A Little Help needed standing up from a chair using your arms (e.g., wheelchair or bedside chair)?: A Little Help needed to walk in hospital room?: A Lot Help needed climbing 3-5 steps with a railing? : A Lot 6 Click Score: 16    End of Session   Activity Tolerance: Patient tolerated treatment well Patient left: in chair;with call bell/phone within reach Nurse Communication: Mobility status PT Visit Diagnosis: Unsteadiness on feet (R26.81);Other abnormalities of gait and mobility (R26.89);Muscle weakness (generalized) (M62.81);History of falling (Z91.81)     Time: 8960-8889 PT Time Calculation (min) (ACUTE ONLY): 31 min  Charges:    $Therapeutic Exercise: 8-22 mins $Therapeutic Activity: 8-22 mins PT General Charges $$ ACUTE PT VISIT: 1 Visit                     3:45 PM, 01/10/24 Lynwood Music, MPT Physical Therapist with [facility] [phone] office [phone] mobile phone

## 2024-01-10 NOTE — ED Notes (Signed)
 Per, Ivette Marks pt has to be at [facility] before 8 pm today otherwise they will not take pt over the weekend. Spoke with pts daughter and primary RN and pt is able to stand to get in a wheelchair. Transportation set up for pt to go to [facility]

## 2024-01-10 NOTE — Progress Notes (Signed)
 Occupational Therapy Treatment Patient Details Name: [name] MRN: [id number] DOB: 01/26/1951 Today's Date: 01/10/2024   History of present illness [name] is a 73 y/o female with history of frequent falls on Plavix . Last fall last week hit left side of head, bruised with HA since. Fall this morning resulted in rt shoulder pain with "pop." Pt denies LOC.   OT comments  Pt agreeable to OT treatment. Pt able to complete ~10 reps of sit to stand in sets of 5. Pt stood up in rapid consecutive motions for the second set with most difficulty noted in controlled sitting. Pt noted to plop into the chair at times. Pt also able to engage in some L UE strengthening seated in the chair. Pt will benefit from continued OT in the hospital and recommended venue below to increase strength, balance, and endurance for safe ADL's.         If plan is discharge home, recommend the following:  A little help with walking and/or transfers;A lot of help with bathing/dressing/bathroom;Assistance with cooking/housework;Assist for transportation;Help with stairs or ramp for entrance   Equipment Recommendations  None recommended by OT          Precautions / Restrictions Precautions Precautions: Fall Recall of Precautions/Restrictions: Intact Required Braces or Orthoses: Sling Restrictions Weight Bearing Restrictions Per Provider Order: Yes RUE Weight Bearing Per Provider Order: Non weight bearing       Mobility Bed Mobility               General bed mobility comments: Pt seated in chair to start session.    Transfers Overall transfer level: Needs assistance Equipment used: Rolling walker (2 wheels) Transfers: Sit to/from Stand Sit to Stand: Contact guard assist           General transfer comment: x10 reps of sit to stand in sets of 5. CGA for safety. Pt noted to struggle with lowering her self to the chair in slow controlled movements. RW available and used PRN but pt mostly  using L UE on chair arm rather than RW per coaching by this therapist.     Balance Overall balance assessment: Needs assistance Sitting-balance support: Feet unsupported, No upper extremity supported Sitting balance-Leahy Scale: Good Sitting balance - Comments: seated in chair   Standing balance support: During functional activity, Single extremity supported Standing balance-Leahy Scale: Fair Standing balance comment: Brief standing at edge of chair with RW available for steady assit PRN.                             Extremity/Trunk Assessment Upper Extremity Assessment Upper Extremity Assessment: RUE deficits/detail;LUE deficits/detail RUE Deficits / Details: NWB in sling LUE Deficits / Details: General weakness but good functional use.   Lower Extremity Assessment Lower Extremity Assessment: Defer to PT evaluation        Vision   Vision Assessment?: No apparent visual deficits   Perception Perception Perception: Not tested   Praxis Praxis Praxis: Not tested   Communication Communication Communication: No apparent difficulties   Cognition Arousal: Alert Behavior During Therapy: WFL for tasks assessed/performed Cognition: No apparent impairments                               Following commands: Intact        Cueing   Cueing Techniques: Verbal cues, Tactile cues  Exercises Exercises: General Upper Extremity  General Exercises - Upper Extremity Shoulder Flexion: AROM, 10 reps, Left, Seated (x10 protratction as well)                 Pertinent Vitals/ Pain       Pain Assessment Pain Assessment: 0-10 Pain Score: 7  Pain Location: R shoulder Pain Descriptors / Indicators: Sharp Pain Intervention(s): Limited activity within patient's tolerance, Monitored during session                                                          Frequency  Min 2X/week        Progress Toward Goals  OT Goals(current goals  can now be found in the care plan section)  Progress towards OT goals: Progressing toward goals  Acute Rehab OT Goals Patient Stated Goal: improve function OT Goal Formulation: With patient Time For Goal Achievement: 01/23/24 Potential to Achieve Goals: Good ADL Goals Pt Will Perform Grooming: with contact guard assist;sitting Pt Will Perform Upper Body Bathing: with contact guard assist;sitting Pt Will Perform Upper Body Dressing: with contact guard assist;sitting Pt Will Perform Lower Body Dressing: with min assist;sitting/lateral leans Pt Will Transfer to Toilet: with modified independence Pt Will Perform Toileting - Clothing Manipulation and hygiene: with contact guard assist;sitting/lateral leans Pt/caregiver will Perform Home Exercise Program: Increased strength;Left upper extremity;Independently  Plan                                      End of Session Equipment Utilized During Treatment: Rolling walker (2 wheels);Gait belt  OT Visit Diagnosis: Unsteadiness on feet (R26.81);Other abnormalities of gait and mobility (R26.89);Muscle weakness (generalized) (M62.81);Repeated falls (R29.6);History of falling (Z91.81)   Activity Tolerance Patient tolerated treatment well   Patient Left in chair;with call bell/phone within reach;with family/visitor present;with nursing/sitter in room   Nurse Communication          Time: 1308-6578 OT Time Calculation (min): 16 min  Charges: OT General Charges $OT Visit: 1 Visit OT Treatments $Therapeutic Exercise: 8-22 mins  [name] OT, MOT  [name] 01/10/2024, 1:29 PM

## 2024-01-13 ENCOUNTER — Other Ambulatory Visit: Payer: Self-pay

## 2024-01-13 ENCOUNTER — Emergency Department (HOSPITAL_COMMUNITY)

## 2024-01-13 ENCOUNTER — Observation Stay (HOSPITAL_COMMUNITY)
Admission: EM | Admit: 2024-01-13 | Discharge: 2024-01-16 | Disposition: A | Discharge status: Skilled Nursing Facility | Attending: Internal Medicine | Admitting: Internal Medicine

## 2024-01-13 ENCOUNTER — Encounter (HOSPITAL_COMMUNITY): Payer: Self-pay | Admitting: Emergency Medicine

## 2024-01-13 DIAGNOSIS — S42021D Displaced fracture of shaft of right clavicle, subsequent encounter for fracture with routine healing: Secondary | ICD-10-CM | POA: Diagnosis not present

## 2024-01-13 DIAGNOSIS — M4802 Spinal stenosis, cervical region: Secondary | ICD-10-CM | POA: Diagnosis not present

## 2024-01-13 DIAGNOSIS — Z79899 Other long term (current) drug therapy: Secondary | ICD-10-CM | POA: Diagnosis not present

## 2024-01-13 DIAGNOSIS — Z7982 Long term (current) use of aspirin: Secondary | ICD-10-CM | POA: Diagnosis not present

## 2024-01-13 DIAGNOSIS — E1159 Type 2 diabetes mellitus with other circulatory complications: Secondary | ICD-10-CM | POA: Diagnosis present

## 2024-01-13 DIAGNOSIS — Z794 Long term (current) use of insulin: Secondary | ICD-10-CM | POA: Diagnosis not present

## 2024-01-13 DIAGNOSIS — R4781 Slurred speech: Secondary | ICD-10-CM | POA: Insufficient documentation

## 2024-01-13 DIAGNOSIS — Z8673 Personal history of transient ischemic attack (TIA), and cerebral infarction without residual deficits: Secondary | ICD-10-CM | POA: Diagnosis not present

## 2024-01-13 DIAGNOSIS — R29818 Other symptoms and signs involving the nervous system: Secondary | ICD-10-CM | POA: Diagnosis not present

## 2024-01-13 DIAGNOSIS — E538 Deficiency of other specified B group vitamins: Secondary | ICD-10-CM | POA: Diagnosis present

## 2024-01-13 DIAGNOSIS — S42009A Fracture of unspecified part of unspecified clavicle, initial encounter for closed fracture: Secondary | ICD-10-CM | POA: Diagnosis not present

## 2024-01-13 DIAGNOSIS — R296 Repeated falls: Secondary | ICD-10-CM | POA: Diagnosis not present

## 2024-01-13 DIAGNOSIS — E782 Mixed hyperlipidemia: Secondary | ICD-10-CM | POA: Diagnosis present

## 2024-01-13 DIAGNOSIS — E1165 Type 2 diabetes mellitus with hyperglycemia: Secondary | ICD-10-CM | POA: Diagnosis present

## 2024-01-13 DIAGNOSIS — W19XXXA Unspecified fall, initial encounter: Secondary | ICD-10-CM | POA: Diagnosis not present

## 2024-01-13 DIAGNOSIS — Z7902 Long term (current) use of antithrombotics/antiplatelets: Secondary | ICD-10-CM | POA: Insufficient documentation

## 2024-01-13 DIAGNOSIS — I6523 Occlusion and stenosis of bilateral carotid arteries: Secondary | ICD-10-CM | POA: Diagnosis not present

## 2024-01-13 DIAGNOSIS — I10 Essential (primary) hypertension: Secondary | ICD-10-CM | POA: Diagnosis present

## 2024-01-13 DIAGNOSIS — I2510 Atherosclerotic heart disease of native coronary artery without angina pectoris: Secondary | ICD-10-CM | POA: Insufficient documentation

## 2024-01-13 DIAGNOSIS — Z955 Presence of coronary angioplasty implant and graft: Secondary | ICD-10-CM | POA: Insufficient documentation

## 2024-01-13 DIAGNOSIS — R519 Headache, unspecified: Secondary | ICD-10-CM | POA: Diagnosis not present

## 2024-01-13 DIAGNOSIS — R569 Unspecified convulsions: Secondary | ICD-10-CM | POA: Diagnosis not present

## 2024-01-13 DIAGNOSIS — I509 Heart failure, unspecified: Secondary | ICD-10-CM | POA: Diagnosis not present

## 2024-01-13 DIAGNOSIS — S42021A Displaced fracture of shaft of right clavicle, initial encounter for closed fracture: Secondary | ICD-10-CM | POA: Diagnosis not present

## 2024-01-13 DIAGNOSIS — Z951 Presence of aortocoronary bypass graft: Secondary | ICD-10-CM | POA: Insufficient documentation

## 2024-01-13 DIAGNOSIS — Z7985 Long-term (current) use of injectable non-insulin antidiabetic drugs: Secondary | ICD-10-CM | POA: Diagnosis not present

## 2024-01-13 DIAGNOSIS — M19011 Primary osteoarthritis, right shoulder: Secondary | ICD-10-CM | POA: Diagnosis not present

## 2024-01-13 LAB — COMPREHENSIVE METABOLIC PANEL WITH GFR
ALT: 13 U/L (ref 0–44)
AST: 16 U/L (ref 15–41)
Albumin: 3.7 g/dL (ref 3.5–5.0)
Alkaline Phosphatase: 61 U/L (ref 38–126)
Anion gap: 11 (ref 5–15)
BUN: 19 mg/dL (ref 8–23)
CO2: 26 mmol/L (ref 22–32)
Calcium: 9.4 mg/dL (ref 8.9–10.3)
Chloride: 95 mmol/L — ABNORMAL LOW (ref 98–111)
Creatinine, Ser: 1.18 mg/dL — ABNORMAL HIGH (ref 0.44–1.00)
GFR, Estimated: 48 mL/min — ABNORMAL LOW (ref >60)
Glucose, Bld: 239 mg/dL — ABNORMAL HIGH (ref 70–99)
Potassium: 3.6 mmol/L (ref 3.5–5.1)
Sodium: 132 mmol/L — ABNORMAL LOW (ref 135–145)
Total Bilirubin: 0.7 mg/dL (ref 0.0–1.2)
Total Protein: 7.4 g/dL (ref 6.5–8.1)

## 2024-01-13 LAB — CBC WITH DIFFERENTIAL/PLATELET
Abs Immature Granulocytes: 0.02 10*3/uL (ref 0.00–0.07)
Basophils Absolute: 0 10*3/uL (ref 0.0–0.1)
Basophils Relative: 0 %
Eosinophils Absolute: 0.2 10*3/uL (ref 0.0–0.5)
Eosinophils Relative: 2 %
HCT: 35.9 % — ABNORMAL LOW (ref 36.0–46.0)
Hemoglobin: 12.3 g/dL (ref 12.0–15.0)
Immature Granulocytes: 0 %
Lymphocytes Relative: 28 %
Lymphs Abs: 2.5 10*3/uL (ref 0.7–4.0)
MCH: 30.5 pg (ref 26.0–34.0)
MCHC: 34.3 g/dL (ref 30.0–36.0)
MCV: 89.1 fL (ref 80.0–100.0)
Monocytes Absolute: 0.5 10*3/uL (ref 0.1–1.0)
Monocytes Relative: 6 %
Neutro Abs: 5.4 10*3/uL (ref 1.7–7.7)
Neutrophils Relative %: 64 %
Platelets: 191 10*3/uL (ref 150–400)
RBC: 4.03 MIL/uL (ref 3.87–5.11)
RDW: 14.1 % (ref 11.5–15.5)
WBC: 8.6 10*3/uL (ref 4.0–10.5)
nRBC: 0 % (ref 0.0–0.2)

## 2024-01-13 LAB — LIPASE, BLOOD: Lipase: 23 U/L (ref 11–51)

## 2024-01-13 MED ORDER — HYDROCODONE-ACETAMINOPHEN 5-325 MG PO TABS
1.0000 | ORAL_TABLET | Freq: Once | ORAL | Status: AC
  Administered 2024-01-13: 1 via ORAL
  Filled 2024-01-13: qty 1

## 2024-01-13 NOTE — ED Triage Notes (Signed)
 Ems was called out for AMS, but when ems arrived she was alert and oriented. Facility states she had a seizure earlier today. Pt c/o right shoulder pain and headache since Fall last Wed. She is requesting a MRI on shoulder.

## 2024-01-13 NOTE — ED Provider Notes (Signed)
 11:49 PM Assumed care from Dr. Charlee Conine, please see their note for full history, physical and decision making until this point. In brief this is a 73 y.o. year old female who presented to the ED tonight with Shoulder Pain     Increased falls. Questionable seizure activity w/ dysarthria this morning. Planning for MRI/rEEG per neuro recs. Plan for admit here.   Discharge instructions, including strict return precautions for new or worsening symptoms, given. Patient and/or family verbalized understanding and agreement with the plan as described.   Labs, studies and imaging reviewed by myself and considered in medical decision making if ordered. Imaging interpreted by radiology.  Labs Reviewed  CBC WITH DIFFERENTIAL/PLATELET - Abnormal; Notable for the following components:      Result Value   HCT 35.9 (*)    All other components within normal limits  COMPREHENSIVE METABOLIC PANEL WITH GFR - Abnormal; Notable for the following components:   Sodium 132 (*)    Chloride 95 (*)    Glucose, Bld 239 (*)    Creatinine, Ser 1.18 (*)    GFR, Estimated 48 (*)    All other components within normal limits  LIPASE, BLOOD  URINALYSIS, ROUTINE W REFLEX MICROSCOPIC    CT Cervical Spine Wo Contrast  Final Result    CT Head Wo Contrast  Final Result    DG Shoulder Right    (Results Pending)  DG Chest Portable 1 View    (Results Pending)  MR BRAIN WO CONTRAST    (Results Pending)    No follow-ups on file.

## 2024-01-13 NOTE — ED Provider Notes (Signed)
 [place] EMERGENCY DEPARTMENT AT [facility] Provider Note   CSN: [id number] Arrival date & time: 01/13/24  2005     History  Chief Complaint  Patient presents with   Shoulder Pain    [name] is a 73 y.o. female.  73 year old female with past medical history of diabetes and hypertension presenting to the emergency department today due to multiple falls as well as questionable seizure activity earlier today.  The patient is currently in a rehab facility for frequent falls.  She has had workups in the emergency department that have been unremarkable.  I did call and get some history from the patient's daughter who tells me that the patient's sister was there with her today when she had some tonic-clonic seizure activity had some slurred speech afterwards.  She did have some confusion and does seem to be back to her baseline currently.  They report that since she has been in the nursing facility that she has had some confusion intermittently at the nursing facility.  The patient herself tells me that she remembers everything today but this history is questionable.   Shoulder Pain      Home Medications Prior to Admission medications   Medication Sig Start Date End Date Taking? Authorizing Provider  aspirin  81 MG chewable tablet Chew 1 tablet (81 mg total) by mouth daily. 03/28/22   Rai, Hurman Maiden, MD  aspirin -acetaminophen -caffeine (EXCEDRIN MIGRAINE) 250-250-65 MG tablet Take 1 tablet by mouth every 6 (six) hours as needed for headache.    [provider]  Blood Glucose Monitoring Suppl DEVI 1 each by Does not apply route in the morning, at noon, and at bedtime. May substitute to any manufacturer covered by patient's insurance. 06/19/23   Albertha Huger, FNP  clopidogrel  (PLAVIX ) 75 MG tablet Take 1 tablet (75 mg total) by mouth daily with breakfast. 10/02/23   Lenise Quince, MD  Continuous Glucose Sensor (FREESTYLE LIBRE 3 SENSOR) MISC PLACE 1 SENSOR ON THE  SKIN EVERY 14 DAYS 05/16/23   Tommas Fragmin A, FNP  diphenhydrAMINE  (BENADRYL ) 25 mg capsule Take 25 mg by mouth every 6 (six) hours as needed for allergies.    [provider]  furosemide  (LASIX ) 20 MG tablet Take 1 tablet (20 mg total) by mouth daily. 10/02/23   Albertha Huger, FNP  insulin  aspart (NOVOLOG  FLEXPEN) 100 UNIT/ML FlexPen Inject 36 Units into the skin 3 (three) times daily with meals. Patient taking differently: Inject 25-36 Units into the skin 3 (three) times daily with meals. 10/06/22   Vann, Jessica U, DO  insulin  degludec (TRESIBA ) 200 UNIT/ML FlexTouch Pen Inject 30-60 Units into the skin daily. Pt states she takes 60 units if she eats and 30 units if she does not eat    [provider]  levocetirizine (XYZAL ) 5 MG tablet Take 0.5 tablets (2.5 mg total) by mouth every evening. 03/06/23   Albertha Huger, FNP  lisinopril  (ZESTRIL ) 10 MG tablet Take 1 tablet (10 mg total) by mouth daily. 11/21/22   Eilleen Grates, MD  metoprolol  tartrate (LOPRESSOR ) 25 MG tablet Take 2 tablets (50 mg total) by mouth 2 (two) times daily. 11/29/23   Demaris Fillers, MD  nitroGLYCERIN  (NITROSTAT ) 0.4 MG SL tablet DISSOLVE 1 TABLET UNDER TONGUE FOR CHESTPAIN.MAY REPEAT EVERY 5 MINUTES FOR 3 DOSES.IF NO RELIEF CALL 911 OR GO TO ER 03/27/22   Rai, Hurman Maiden, MD  potassium chloride  (KLOR-CON ) 10 MEQ tablet TAKE ONE (1) TABLET BY MOUTH TWO (  2) TIMES DAILY Patient taking differently: Take 10 mEq by mouth daily. 06/17/23   Albertha Huger, FNP  rOPINIRole  (REQUIP ) 0.5 MG tablet Take 2 tablets (1 mg total) by mouth daily as needed (restless leg). 10/06/22   Vann, Jessica U, DO  rosuvastatin  (CRESTOR ) 20 MG tablet TAKE ONE (1) TABLET BY MOUTH EVERY DAY 10/02/23   Albertha Huger, FNP  Semaglutide , 2 MG/DOSE, 8 MG/3ML SOPN Inject 2 mg as directed once a week. Patient not taking: Reported on 01/08/2024 07/26/23   Albertha Huger, FNP  sertraline  (ZOLOFT ) 50 MG tablet Take 1 tablet (50 mg total) by  mouth daily. 05/08/23   Albertha Huger, FNP  traZODone  (DESYREL ) 50 MG tablet Take 1 tablet (50 mg total) by mouth at bedtime as needed for sleep. 05/08/23   Albertha Huger, FNP      Allergies    Iohexol , Ticlid [ticlopidine hcl], Jardiance  [empagliflozin ], Metformin  and related, and Codeine    Review of Systems   Review of Systems  Physical Exam Updated Vital Signs BP 115/60   Pulse 91   Temp 98.1 F (36.7 C) (Oral)   Resp 16   Ht 5\' 6"  (1.676 m)   Wt 75 kg   SpO2 98%   BMI 26.69 kg/m  Physical Exam Vitals and nursing note reviewed.   Gen: NAD Eyes: PERRL, EOMI HEENT: no oropharyngeal swelling Neck: trachea midline, no meningismus Resp: clear to auscultation bilaterally Card: RRR, no murmurs, rubs, or gallops Abd: nontender, nondistended Extremities: no calf tenderness, no edema Vascular: 2+ radial pulses bilaterally, 2+ DP pulses bilaterally Neuro: no focal deficits, AAO x 3 Skin: no rashes Psyc: acting appropriately   ED Results / Procedures / Treatments   Labs (all labs ordered are listed, but only abnormal results are displayed) Labs Reviewed  CBC WITH DIFFERENTIAL/PLATELET - Abnormal; Notable for the following components:      Result Value   HCT 35.9 (*)    All other components within normal limits  COMPREHENSIVE METABOLIC PANEL WITH GFR - Abnormal; Notable for the following components:   Sodium 132 (*)    Chloride 95 (*)    Glucose, Bld 239 (*)    Creatinine, Ser 1.18 (*)    GFR, Estimated 48 (*)    All other components within normal limits  LIPASE, BLOOD  URINALYSIS, ROUTINE W REFLEX MICROSCOPIC    EKG None  Radiology No results found.  Procedures Procedures    Medications Ordered in ED Medications  HYDROcodone -acetaminophen  (NORCO/VICODIN) 5-325 MG per tablet 1 tablet (has no administration in time range)    ED Course/ Medical Decision Making/ A&P                                 Medical Decision Making 73 year old female with  past medical history of diabetes and hypertension presenting to the emergency department today with questionable seizure activity.  I will order a CT scan of her head and cervical spine to evaluate for acute traumatic injuries given her recurrent falls.  I did obtain a chest x-ray as well as a shoulder x-ray here.  These interpreted by me shows no acute infiltrates, no pulmonary edema, no pneumothorax on the chest x-ray.  Shoulder x-ray demonstrates her known clavicle fracture.  Initial labs are reassuring.  I did call and discussed her case with Dr. Murvin Arthurs due to the need to seizures.  He did recommend MRI of her brain  as well as an EEG in the morning for further evaluation.  She will be admitted after CT imaging for further evaluation management.  Amount and/or Complexity of Data Reviewed Labs: ordered. Radiology: ordered.  Risk Prescription drug management.           Final Clinical Impression(s) / ED Diagnoses Final diagnoses:  Frequent falls  Seizure-like activity [facility])    Rx / DC Orders ED Discharge Orders     None         Carin Charleston, MD 01/13/24 [phone]

## 2024-01-13 NOTE — ED Notes (Addendum)
 Per the pts daughter the pt has been very confused since falling last week. Daughter said 'she's been talking off the wall since'. Family is concerned for increased falls recently. Per the daughter the pt went to [facility] on Sunday and was told she had a severe concussion. Family said she is seeing things and hearing things that aren't there and then had a seizure. They believe something is going on and with it, but feel like something is going on

## 2024-01-14 ENCOUNTER — Observation Stay (HOSPITAL_COMMUNITY)

## 2024-01-14 ENCOUNTER — Observation Stay (HOSPITAL_COMMUNITY): Admit: 2024-01-14 | Discharge: 2024-01-14 | Disposition: A | Discharge status: Home / Self Care

## 2024-01-14 ENCOUNTER — Telehealth: Payer: Self-pay | Admitting: Family Medicine

## 2024-01-14 DIAGNOSIS — S42009A Fracture of unspecified part of unspecified clavicle, initial encounter for closed fracture: Secondary | ICD-10-CM | POA: Insufficient documentation

## 2024-01-14 DIAGNOSIS — E1165 Type 2 diabetes mellitus with hyperglycemia: Secondary | ICD-10-CM

## 2024-01-14 DIAGNOSIS — R4781 Slurred speech: Secondary | ICD-10-CM

## 2024-01-14 DIAGNOSIS — R410 Disorientation, unspecified: Secondary | ICD-10-CM

## 2024-01-14 DIAGNOSIS — I6782 Cerebral ischemia: Secondary | ICD-10-CM | POA: Diagnosis not present

## 2024-01-14 DIAGNOSIS — I10 Essential (primary) hypertension: Secondary | ICD-10-CM | POA: Diagnosis not present

## 2024-01-14 DIAGNOSIS — R296 Repeated falls: Secondary | ICD-10-CM | POA: Diagnosis not present

## 2024-01-14 DIAGNOSIS — G319 Degenerative disease of nervous system, unspecified: Secondary | ICD-10-CM | POA: Diagnosis not present

## 2024-01-14 DIAGNOSIS — R569 Unspecified convulsions: Secondary | ICD-10-CM | POA: Diagnosis not present

## 2024-01-14 DIAGNOSIS — Z794 Long term (current) use of insulin: Secondary | ICD-10-CM

## 2024-01-14 DIAGNOSIS — E782 Mixed hyperlipidemia: Secondary | ICD-10-CM | POA: Diagnosis not present

## 2024-01-14 DIAGNOSIS — J320 Chronic maxillary sinusitis: Secondary | ICD-10-CM | POA: Diagnosis not present

## 2024-01-14 LAB — URINALYSIS, ROUTINE W REFLEX MICROSCOPIC
Bacteria, UA: NONE SEEN
Bilirubin Urine: NEGATIVE
Glucose, UA: >=500 mg/dL — AB
Ketones, ur: 5 mg/dL — AB
Leukocytes,Ua: NEGATIVE
Nitrite: NEGATIVE
Protein, ur: 30 mg/dL — AB
Specific Gravity, Urine: 1.014 (ref 1.005–1.030)
pH: 5 (ref 5.0–8.0)

## 2024-01-14 LAB — CBC
HCT: 35.1 % — ABNORMAL LOW (ref 36.0–46.0)
Hemoglobin: 11.5 g/dL — ABNORMAL LOW (ref 12.0–15.0)
MCH: 30.1 pg (ref 26.0–34.0)
MCHC: 32.8 g/dL (ref 30.0–36.0)
MCV: 91.9 fL (ref 80.0–100.0)
Platelets: 159 10*3/uL (ref 150–400)
RBC: 3.82 MIL/uL — ABNORMAL LOW (ref 3.87–5.11)
RDW: 14 % (ref 11.5–15.5)
WBC: 8 10*3/uL (ref 4.0–10.5)
nRBC: 0 % (ref 0.0–0.2)

## 2024-01-14 LAB — COMPREHENSIVE METABOLIC PANEL WITH GFR
ALT: 11 U/L (ref 0–44)
AST: 15 U/L (ref 15–41)
Albumin: 3.3 g/dL — ABNORMAL LOW (ref 3.5–5.0)
Alkaline Phosphatase: 60 U/L (ref 38–126)
Anion gap: 12 (ref 5–15)
BUN: 17 mg/dL (ref 8–23)
CO2: 27 mmol/L (ref 22–32)
Calcium: 9.2 mg/dL (ref 8.9–10.3)
Chloride: 96 mmol/L — ABNORMAL LOW (ref 98–111)
Creatinine, Ser: 1.13 mg/dL — ABNORMAL HIGH (ref 0.44–1.00)
GFR, Estimated: 51 mL/min — ABNORMAL LOW (ref >60)
Glucose, Bld: 332 mg/dL — ABNORMAL HIGH (ref 70–99)
Potassium: 4.3 mmol/L (ref 3.5–5.1)
Sodium: 135 mmol/L (ref 135–145)
Total Bilirubin: 1 mg/dL (ref 0.0–1.2)
Total Protein: 6.9 g/dL (ref 6.5–8.1)

## 2024-01-14 LAB — LIPID PANEL
Cholesterol: 104 mg/dL (ref 0–200)
HDL: 42 mg/dL (ref >40)
LDL Cholesterol: 49 mg/dL (ref 0–99)
Total CHOL/HDL Ratio: 2.5 ratio
Triglycerides: 67 mg/dL (ref <150)
VLDL: 13 mg/dL (ref 0–40)

## 2024-01-14 LAB — VITAMIN B12: Vitamin B-12: 138 pg/mL — ABNORMAL LOW (ref 180–914)

## 2024-01-14 LAB — TSH: TSH: 2.721 u[IU]/mL (ref 0.350–4.500)

## 2024-01-14 LAB — GLUCOSE, CAPILLARY
Glucose-Capillary: 280 mg/dL — ABNORMAL HIGH (ref 70–99)
Glucose-Capillary: 252 mg/dL — ABNORMAL HIGH (ref 70–99)
Glucose-Capillary: 317 mg/dL — ABNORMAL HIGH (ref 70–99)

## 2024-01-14 LAB — PHOSPHORUS: Phosphorus: 3.1 mg/dL (ref 2.5–4.6)

## 2024-01-14 LAB — MAGNESIUM: Magnesium: 1.9 mg/dL (ref 1.7–2.4)

## 2024-01-14 LAB — FOLATE: Folate: 9.8 ng/mL (ref >5.9)

## 2024-01-14 LAB — CBG MONITORING, ED: Glucose-Capillary: 338 mg/dL — ABNORMAL HIGH (ref 70–99)

## 2024-01-14 MED ORDER — METOPROLOL TARTRATE 25 MG PO TABS
25.0000 mg | ORAL_TABLET | Freq: Two times a day (BID) | ORAL | Status: DC
  Administered 2024-01-14 – 2024-01-16 (×4): 25 mg via ORAL
  Filled 2024-01-14 (×4): qty 1

## 2024-01-14 MED ORDER — ROSUVASTATIN CALCIUM 20 MG PO TABS
20.0000 mg | ORAL_TABLET | Freq: Every day | ORAL | Status: DC
  Administered 2024-01-15 – 2024-01-16 (×2): 20 mg via ORAL
  Filled 2024-01-14 (×2): qty 1

## 2024-01-14 MED ORDER — ASPIRIN 81 MG PO TBEC
81.0000 mg | DELAYED_RELEASE_TABLET | Freq: Every day | ORAL | Status: DC
  Administered 2024-01-14 – 2024-01-16 (×3): 81 mg via ORAL
  Filled 2024-01-14 (×3): qty 1

## 2024-01-14 MED ORDER — LISINOPRIL 10 MG PO TABS
10.0000 mg | ORAL_TABLET | Freq: Every day | ORAL | Status: DC
  Administered 2024-01-14 – 2024-01-16 (×3): 10 mg via ORAL
  Filled 2024-01-14 (×3): qty 1

## 2024-01-14 MED ORDER — OXYCODONE HCL 5 MG PO TABS: 5.0000 mg | ORAL_TABLET | ORAL | Status: DC | PRN

## 2024-01-14 MED ORDER — CLOPIDOGREL BISULFATE 75 MG PO TABS
75.0000 mg | ORAL_TABLET | Freq: Every day | ORAL | Status: DC
  Administered 2024-01-14 – 2024-01-16 (×3): 75 mg via ORAL
  Filled 2024-01-14 (×3): qty 1

## 2024-01-14 MED ORDER — PROCHLORPERAZINE EDISYLATE 10 MG/2ML IJ SOLN: 10.0000 mg | Freq: Four times a day (QID) | INTRAMUSCULAR | Status: DC | PRN

## 2024-01-14 MED ORDER — ACETAMINOPHEN 325 MG PO TABS
650.0000 mg | ORAL_TABLET | Freq: Four times a day (QID) | ORAL | Status: DC | PRN
  Administered 2024-01-14 – 2024-01-15 (×2): 650 mg via ORAL
  Filled 2024-01-14 (×2): qty 2

## 2024-01-14 MED ORDER — INSULIN ASPART 100 UNIT/ML IJ SOLN
0.0000 [IU] | Freq: Three times a day (TID) | INTRAMUSCULAR | Status: DC
  Administered 2024-01-14: 8 [IU] via SUBCUTANEOUS
  Administered 2024-01-14: 11 [IU] via SUBCUTANEOUS
  Administered 2024-01-14 – 2024-01-15 (×2): 8 [IU] via SUBCUTANEOUS
  Administered 2024-01-15: 11 [IU] via SUBCUTANEOUS
  Administered 2024-01-16: 8 [IU] via SUBCUTANEOUS

## 2024-01-14 MED ORDER — FUROSEMIDE 20 MG PO TABS
20.0000 mg | ORAL_TABLET | Freq: Every day | ORAL | Status: DC
  Administered 2024-01-14 – 2024-01-16 (×3): 20 mg via ORAL
  Filled 2024-01-14 (×3): qty 1

## 2024-01-14 MED ORDER — ATORVASTATIN CALCIUM 40 MG PO TABS
40.0000 mg | ORAL_TABLET | Freq: Every day | ORAL | Status: DC
  Administered 2024-01-14 – 2024-01-15 (×2): 40 mg via ORAL
  Filled 2024-01-14 (×2): qty 1

## 2024-01-14 MED ORDER — ACETAMINOPHEN 650 MG RE SUPP: 650.0000 mg | Freq: Four times a day (QID) | RECTAL | Status: DC | PRN

## 2024-01-14 MED ORDER — OXYCODONE HCL 5 MG PO TABS
5.0000 mg | ORAL_TABLET | ORAL | Status: DC | PRN
  Administered 2024-01-15: 5 mg via ORAL
  Filled 2024-01-14: qty 1

## 2024-01-14 MED ORDER — ENOXAPARIN SODIUM 40 MG/0.4ML IJ SOSY: 40.0000 mg | PREFILLED_SYRINGE | INTRAMUSCULAR | Status: DC

## 2024-01-14 MED ORDER — INSULIN ASPART 100 UNIT/ML IJ SOLN
0.0000 [IU] | Freq: Every day | INTRAMUSCULAR | Status: DC
  Administered 2024-01-14: 4 [IU] via SUBCUTANEOUS
  Administered 2024-01-15: 2 [IU] via SUBCUTANEOUS

## 2024-01-14 MED ORDER — INSULIN GLARGINE-YFGN 100 UNIT/ML ~~LOC~~ SOLN
10.0000 [IU] | Freq: Every day | SUBCUTANEOUS | Status: DC
  Administered 2024-01-14: 10 [IU] via SUBCUTANEOUS
  Filled 2024-01-14 (×2): qty 0.1

## 2024-01-14 NOTE — H&P (Signed)
 History and Physical    Patient: [name]:[id number] DOB: 07-Apr-1951 DOA: 01/13/2024 DOS: the patient was seen and examined on 01/14/2024 PCP: Albertha Huger, FNP  Patient coming from: Home  Chief Complaint:  Chief Complaint  Patient presents with   Shoulder Pain   HPI: [name] is a 73 y.o. female with medical history significant of ronary artery disease status post CABG 02/19/23, hypertension, hyperlipidemia, diabetes mellitus type 2, TIA, depression, atrial tachycardia who presents to the emergency department for evaluation of shoulder pain.  Patient complained of multiple falls as well as questionable seizure activity yesterday.  Patient was in the rehab facility for frequent falls, she was visited by her sister yesterday who witnessed her having tonic-clonic seizure activity and some slurred speech afterwards.  She was confused for some time after the seizure activity, but was back to her baseline by the time she got to the ED.  Apparently, patient was reported to have violent intermittent confusion since she was at nursing facility. 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  ED Course:  In the emergency department, BP was 166/116, other vital signs were within normal range.  Workup in the ED showed normal CBC except for hematocrit of 35.9.  BMP was normal except for sodium of 132, chloride 95, blood glucose 239, creatinine 1.18 (baseline creatinine 0.9-1.1).  Urinalysis was positive for glycosuria and moderate hematuria. CT head without contrast showed no acute ventricular abnormality CT cervical spine showed no acute fracture or static subluxation of the cervical spine. Right shoulder showed increased displacement and overriding of known right midclavicular fracture She was treated with Norco.  Hospitalist was asked to admit patient for further evaluation and management.  Review of Systems: Review of systems as  noted in the HPI. All other systems reviewed and are negative.   Past Medical History:  Diagnosis Date   Anxiety    CAD (coronary artery disease)    DES to circumflex 02/2007, BMS to LAD and PTCA diagonal 03/2007   Carotid artery plaque    Mild   Cataract    Depression    Diverticulitis, colon    Elevated d-dimer 01/08/2014   Essential hypertension, benign    GERD (gastroesophageal reflux disease)    H/O hiatal hernia    HLD (hyperlipidemia)    IDDM (insulin  dependent diabetes mellitus)    Migraine    "used to have them really bad; don't have them anymore" (01/07/2014)   MS (multiple sclerosis) (HCC)    Not confirmed   PAT (paroxysmal atrial tachycardia) (HCC)    Prolapse of uterus    PVD (peripheral vascular disease) (HCC)    TIA (transient ischemic attack) 1980's   Past Surgical History:  Procedure Laterality Date   ABDOMINAL HYSTERECTOMY  1986   ovaries remain - prolaspe uterus    APPENDECTOMY  ~ 1970   BREAST BIOPSY Right 1980's   BREAST LUMPECTOMY Right 1980's   Dr. Lucinda Saber    CARDIAC CATHETERIZATION  01/07/2014   CHOLECYSTECTOMY  ?1987   COLONOSCOPY  2002   Dr. Anwar--> Severe diverticular changes in the region of the sigmoid and descending colon with scattered diverticular changes throughout the rest of the colon. No polyps, ulcerations. Despite numerous manipulations, the tip of the scope could not be tipped into the cecal area.   COLONOSCOPY  01/10/2012   Procedure: COLONOSCOPY;  Surgeon: Suzette Espy, MD;  Location[facility];  Service: Endoscopy;  Laterality: N/A;  1:55   CORONARY ANGIOPLASTY WITH STENT PLACEMENT  ~ 1997 X 2   "2 + 1"   CORONARY ARTERY BYPASS GRAFT N/A 02/19/2023   Procedure: OFF PUMP CORONARY ARTERY BYPASS GRAFTING (CABG) X 1;  Surgeon: Hilarie Lovely, MD;  Location: [facility] OR;  Service: Open Heart Surgery;  Laterality: N/A;  LIMA TO LAD   CORONARY BALLOON ANGIOPLASTY N/A 10/05/2022   Procedure: CORONARY BALLOON ANGIOPLASTY;  Surgeon:  Arnoldo Lapping, MD;  Location: [facility] INVASIVE CV LAB;  Service: Cardiovascular;  Laterality: N/A;   CORONARY PRESSURE/FFR STUDY N/A 03/08/2017   Procedure: Intravascular Pressure Wire/FFR Study;  Surgeon: Sammy Crisp, MD;  Location: [facility] INVASIVE CV LAB;  Service: Cardiovascular;  Laterality: N/A;   CORONARY STENT INTERVENTION N/A 03/26/2022   Procedure: CORONARY STENT INTERVENTION;  Surgeon: Avanell Leigh, MD;  Location: [facility] INVASIVE CV LAB;  Service: Cardiovascular;  Laterality: N/A;   EYE SURGERY Bilateral 2014   cataract   LEFT HEART CATH AND CORONARY ANGIOGRAPHY N/A 03/08/2017   Procedure: Left Heart Cath and Coronary Angiography;  Surgeon: Sammy Crisp, MD;  Location: [facility] INVAS[facility] CV LAB;  Service: Cardiovascular;  Laterality: N/A;   LEFT HEART CATH AND CORONARY ANGIOGRAPHY N/A 03/26/2022   Procedure: LEFT HEART CATH AND CORONARY ANGIOGRAPHY;  Surgeon: Avanell Leigh, MD;  Location: [facility] INVASIVE CV LAB;  Service: Cardiovascular;  Laterality: N/A;   LEFT HEART CATH AND CORONARY ANGIOGRAPHY N/A 10/05/2022   Procedure: LEFT HEART CATH AND CORONARY ANGIOGRAPHY;  Surgeon: Arnoldo Lapping, MD;  Location: [facility] INVASIVE CV LAB;  Service: Cardiovascular;  Laterality: N/A;   LEFT HEART CATH AND CORONARY ANGIOGRAPHY N/A 02/14/2023   Procedure: LEFT HEART CATH AND CORONARY ANGIOGRAPHY;  Surgeon: [place]az[place], Peter M, MD;  Location: [facility] INVASIVE CV LAB;  Service: Cardiovascular;  Laterality: N/A;   LEFT HEART CATHETERIZATION WITH CORONARY ANGIOGRAM N/A 01/07/2014   Procedure: LEFT HEART CATHETERIZATION WITH CORONARY ANGIOGRAM;  Surgeon: Darlis Eisenmenger, MD;  Location: [facility] CATH LAB;  Service: Cardiovascular;  Laterality: N/A;   TEE WITHOUT CARDIOVERSION N/A 02/19/2023   Procedure: TRANSESOPHAGEAL ECHOCARDIOGRAM;  Surgeon: Hilarie Lovely, MD;  Location: [facility] OR;  Service: Open Heart Surgery;  Laterality: N/A;    Social History:  reports that she has never smoked. She has never used smokeless tobacco. She  reports that she does not drink alcohol and does not use drugs.   Allergies  Allergen Reactions   Iohexol       Desc: pt had syncopal episode with nausea post IV CM late 1990's,  pt has had prednisone  prep with heart caths x 2 without problem  kdean 04/16/07, Onset Date: [id number]    Ticlid [Ticlopidine Hcl] Nausea And Vomiting   Jardiance  [Empagliflozin ] Other (See Comments)    Recurrent UTIs   Metformin  And Related Diarrhea   Codeine Nausea And Vomiting and Palpitations    Family History  Problem Relation Age of Onset   Heart attack Mother 76   Diabetes Mother    Hypertension Mother    Heart attack Father 74   Heart attack Brother 32       x 6   Heart disease Brother    Diabetes Brother    Colon cancer Paternal Aunt        46s, died with brain anuerysm   Crohn's disease Cousin        paternal   Diabetes Sister    GER disease Daughter    Cervical cancer Daughter  Diabetes Daughter      Prior to Admission medications   Medication Sig Start Date End Date Taking? Authorizing Provider  aspirin  81 MG chewable tablet Chew 1 tablet (81 mg total) by mouth daily. 03/28/22   Rai, Hurman Maiden, MD  aspirin -acetaminophen -caffeine (EXCEDRIN MIGRAINE) 250-250-65 MG tablet Take 1 tablet by mouth every 6 (six) hours as needed for headache.    [provider]  Blood Glucose Monitoring Suppl DEVI 1 each by Does not apply route in the morning, at noon, and at bedtime. May substitute to any manufacturer covered by patient's insurance. 06/19/23   Albertha Huger, FNP  clopidogrel  (PLAVIX ) 75 MG tablet Take 1 tablet (75 mg total) by mouth daily with breakfast. 10/02/23   Lenise Quince, MD  Continuous Glucose Sensor (FREESTYLE LIBRE 3 SENSOR) MISC PLACE 1 SENSOR ON THE SKIN EVERY 14 DAYS 05/16/23   Tommas Fragmin A, FNP  diphenhydrAMINE  (BENADRYL ) 25 mg capsule Take 25 mg by mouth every 6 (six) hours as needed for allergies.    [provider]  furosemide  (LASIX ) 20 MG tablet Take  1 tablet (20 mg total) by mouth daily. 10/02/23   Albertha Huger, FNP  insulin  aspart (NOVOLOG  FLEXPEN) 100 UNIT/ML FlexPen Inject 36 Units into the skin 3 (three) times daily with meals. Patient taking differently: Inject 25-36 Units into the skin 3 (three) times daily with meals. 10/06/22   Vann, Jessica U, DO  insulin  degludec (TRESIBA ) 200 UNIT/ML FlexTouch Pen Inject 30-60 Units into the skin daily. Pt states she takes 60 units if she eats and 30 units if she does not eat    [provider]  levocetirizine (XYZAL ) 5 MG tablet Take 0.5 tablets (2.5 mg total) by mouth every evening. 03/06/23   Albertha Huger, FNP  lisinopril  (ZESTRIL ) 10 MG tablet Take 1 tablet (10 mg total) by mouth daily. 11/21/22   Eilleen Grates, MD  metoprolol  tartrate (LOPRESSOR ) 25 MG tablet Take 2 tablets (50 mg total) by mouth 2 (two) times daily. 11/29/23   Demaris Fillers, MD  nitroGLYCERIN  (NITROSTAT ) 0.4 MG SL tablet DISSOLVE 1 TABLET UNDER TONGUE FOR CHESTPAIN.MAY REPEAT EVERY 5 MINUTES FOR 3 DOSES.IF NO RELIEF CALL 911 OR GO TO ER 03/27/22   Rai, Ripudeep K, MD  potassium chloride  (KLOR-CON ) 10 MEQ tablet TAKE ONE (1) TABLET BY MOUTH TWO (2) TIMES DAILY Patient taking differently: Take 10 mEq by mouth daily. 06/17/23   Albertha Huger, FNP  rOPINIRole  (REQUIP ) 0.5 MG tablet Take 2 tablets (1 mg total) by mouth daily as needed (restless leg). 10/06/22   Vann, Jessica U, DO  rosuvastatin  (CRESTOR ) 20 MG tablet TAKE ONE (1) TABLET BY MOUTH EVERY DAY 10/02/23   Albertha Huger, FNP  Semaglutide , 2 MG/DOSE, 8 MG/3ML SOPN Inject 2 mg as directed once a week. Patient not taking: Reported on 01/08/2024 07/26/23   Albertha Huger, FNP  sertraline  (ZOLOFT ) 50 MG tablet Take 1 tablet (50 mg total) by mouth daily. 05/08/23   Albertha Huger, FNP  traZODone  (DESYREL ) 50 MG tablet Take 1 tablet (50 mg total) by mouth at bedtime as needed for sleep. 05/08/23   Albertha Huger, FNP    Physical Exam: BP 115/60   Pulse 91    Temp 98.1 F (36.7 C) (Oral)   Resp 16   Ht 5\' 6"  (1.676 m)   Wt 75 kg   SpO2 98%   BMI 26.69 kg/m   General: 73 y.o. year-old female well developed  well nourished in no acute distress.  Alert and oriented x3. HEENT: NCAT, EOMI Neck: Supple, trachea medial Cardiovascular: Regular rate and rhythm with no rubs or gallops.  No thyromegaly or JVD noted.  No lower extremity edema. 2/4 pulses in all 4 extremities. Respiratory: Clear to auscultation with no wheezes or rales. Good inspiratory effort. Abdomen: Soft, nontender nondistended with normal bowel sounds x4 quadrants. Muskuloskeletal: Limited ROM of right shoulder due to known history of midclavicular fracture.  No cyanosis, clubbing or edema noted bilaterally Neuro: CN II-XII intact, strength 5/5 x 4, sensation, reflexes intact Skin: No ulcerative lesions noted or rashes Psychiatry: Judgement and insight appear normal. Mood is appropriate for condition and setting          Labs on Admission:  Basic Metabolic Panel: Recent Labs  Lab 01/08/24 0905 01/13/24 2122 01/14/24 0508  NA 139 132* 135  K 3.8 3.6 4.3  CL 98 95* 96*  CO2 28 26 27   GLUCOSE 229* 239* 332*  BUN 10 19 17   CREATININE 0.94 1.18* 1.13*  CALCIUM  9.4 9.4 9.2  MG  --   --  1.9  PHOS  --   --  3.1   Liver Function Tests: Recent Labs  Lab 01/13/24 2122 01/14/24 0508  AST 16 15  ALT 13 11  ALKPHOS 61 60  BILITOT 0.7 1.0  PROT 7.4 6.9  ALBUMIN  3.7 3.3*   Recent Labs  Lab 01/13/24 2122  LIPASE 23   No results for input(s): "AMMONIA" in the last 168 hours. CBC: Recent Labs  Lab 01/08/24 0905 01/13/24 2122 01/14/24 0508  WBC 11.1* 8.6 8.0  NEUTROABS 9.5* 5.4  --   HGB 12.9 12.3 11.5*  HCT 39.2 35.9* 35.1*  MCV 91.8 89.1 91.9  PLT 137* 191 159   Cardiac Enzymes: Recent Labs  Lab 01/08/24 0905  CKTOTAL 90    BNP (last 3 results) No results for input(s): "BNP" in the last 8760 hours.  ProBNP (last 3 results) No results for input(s):  "PROBNP" in the last 8760 hours.  CBG: Recent Labs  Lab 01/09/24 1342 01/09/24 1609 01/09/24 2119 01/10/24 0757 01/10/24 1130  GLUCAP 378* 207* 112* 236* 232*    Radiological Exams on Admission: DG Shoulder Right Result Date: 01/14/2024 CLINICAL DATA:  Fall EXAM: RIGHT SHOULDER - 2+ VIEW COMPARISON:  01/08/2024 FINDINGS: Increased displacement and overriding right mid clavicular fracture. Mild degenerative change of the [facility] joint. No fracture or malalignment of the humeral head. IMPRESSION: Increased displacement and overriding of known right mid clavicular fracture. Electronically Signed   By: Esmeralda Hedge M.D.   On: 01/14/2024 00:02   DG Chest Portable 1 View Result Date: 01/14/2024 CLINICAL DATA:  Low oxygen EXAM: PORTABLE CHEST 1 VIEW COMPARISON:  11/28/2023 FINDINGS: Sternotomy. Stable cardiomediastinal silhouette. No acute airspace disease, pleural effusion or pneumothorax. Acute appearing displaced and overriding fracture of the mid right clavicle. IMPRESSION: No active disease. Redemonstrated acute displaced and overriding right clavicle fracture Electronically Signed   By: Esmeralda Hedge M.D.   On: 01/14/2024 00:01   CT Head Wo Contrast Result Date: 01/13/2024 CLINICAL DATA:  Headache and seizure EXAM: CT HEAD WITHOUT CONTRAST CT CERVICAL SPINE WITHOUT CONTRAST TECHNIQUE: Multidetector CT imaging of the head and cervical spine was performed following the standard protocol without intravenous contrast. Multiplanar CT image reconstructions of the cervical spine were also generated. RADIATION DOSE REDUCTION: This exam was performed according to the departmental dose-optimization program which includes automated exposure control, adjustment of the mA  and/or kV according to patient size and/or use of iterative reconstruction technique. COMPARISON:  01/08/2024 FINDINGS: CT HEAD FINDINGS Brain: There is no mass, hemorrhage or extra-axial collection. The appearance of the white matter is normal  for the patient's age. There is generalized atrophy. Vascular: Atherosclerotic calcification of the internal carotid arteries at the skull base. No abnormal hyperdensity of the major intracranial arteries or dural venous sinuses. Skull: The visualized skull base, calvarium and extracranial soft tissues are normal. Sinuses/Orbits: No fluid levels or advanced mucosal thickening of the visualized paranasal sinuses. No mastoid or middle ear effusion. Normal orbits. Other: None. CT CERVICAL SPINE FINDINGS Alignment: No static subluxation. Facets are aligned. Occipital condyles are normally positioned. Skull base and vertebrae: No acute fracture. Soft tissues and spinal canal: No prevertebral fluid or swelling. No visible canal hematoma. Disc levels: Mild spinal canal stenosis at C5-6 and C6-7 due to disc osteophyte complexes. Upper chest: No pneumothorax, pulmonary nodule or pleural effusion. Other: Normal visualized paraspinal cervical soft tissues. IMPRESSION: 1. No acute intracranial abnormality. 2. No acute fracture or static subluxation of the cervical spine. 3. Mild spinal canal stenosis at C5-6 and C6-7 due to disc osteophyte complexes. Electronically Signed   By: Juanetta Nordmann M.D.   On: 01/13/2024 23:47   CT Cervical Spine Wo Contrast Result Date: 01/13/2024 CLINICAL DATA:  Headache and seizure EXAM: CT HEAD WITHOUT CONTRAST CT CERVICAL SPINE WITHOUT CONTRAST TECHNIQUE: Multidetector CT imaging of the head and cervical spine was performed following the standard protocol without intravenous contrast. Multiplanar CT image reconstructions of the cervical spine were also generated. RADIATION DOSE REDUCTION: This exam was performed according to the departmental dose-optimization program which includes automated exposure control, adjustment of the mA and/or kV according to patient size and/or use of iterative reconstruction technique. COMPARISON:  01/08/2024 FINDINGS: CT HEAD FINDINGS Brain: There is no mass,  hemorrhage or extra-axial collection. The appearance of the white matter is normal for the patient's age. There is generalized atrophy. Vascular: Atherosclerotic calcification of the internal carotid arteries at the skull base. No abnormal hyperdensity of the major intracranial arteries or dural venous sinuses. Skull: The visualized skull base, calvarium and extracranial soft tissues are normal. Sinuses/Orbits: No fluid levels or advanced mucosal thickening of the visualized paranasal sinuses. No mastoid or middle ear effusion. Normal orbits. Other: None. CT CERVICAL SPINE FINDINGS Alignment: No static subluxation. Facets are aligned. Occipital condyles are normally positioned. Skull base and vertebrae: No acute fracture. Soft tissues and spinal canal: No prevertebral fluid or swelling. No visible canal hematoma. Disc levels: Mild spinal canal stenosis at C5-6 and C6-7 due to disc osteophyte complexes. Upper chest: No pneumothorax, pulmonary nodule or pleural effusion. Other: Normal visualized paraspinal cervical soft tissues. IMPRESSION: 1. No acute intracranial abnormality. 2. No acute fracture or static subluxation of the cervical spine. 3. Mild spinal canal stenosis at C5-6 and C6-7 due to disc osteophyte complexes. Electronically Signed   By: Juanetta Nordmann M.D.   On: 01/13/2024 23:47    EKG: I independently viewed the EKG done and my findings are as followed: EKG was not done in the ED  Assessment/Plan Present on Admission:  Type 2 diabetes mellitus with hyperglycemia (HCC)  Essential hypertension  Mixed hyperlipidemia  Principal Problem:   Seizure-like activity (HCC) Active Problems:   Essential hypertension   Mixed hyperlipidemia   Type 2 diabetes mellitus with hyperglycemia (HCC)   Recurrent falls   Slurred speech   Clavicular fracture  Seizure-like activity Patient was said to  have a tonic clonic seizure-like activity EEG will be done in the morning Continue neurochecks, seizure  precaution Neurology will be consulted and we shall await further recommendations  Slurred speech rule out acute ischemic stroke CT of the head showed no acute intracranial abnormality MRI of brain without contrast will be done in the morning Consider full stroke workup if MRI of brain is positive for acute ischemic stroke  Recurrent falls Clavicular fracture Continue fall precaution Continue PT/OT eval and treat  Type 2 diabetes mellitus with hyperglycemia Hemoglobin A1c on 11/28/2023 was 9.1 Continue Semglee  10 units nightly and adjust dose accordingly Continue ISS and hypoglycemia protocol  Essential hypertension Continue permissive hypertension pending stroke rule out  Mixed hyperlipidemia Continue Crestor   DVT prophylaxis: SCDs (consider chemoprophylaxis for negative bleeding on MRI)  Family Communication: None at bedside   Advance Care Planning:   Code Status: Full Code   Consults: Neurology  Severity of Illness: The appropriate patient status for this patient is OBSERVATION. Observation status is judged to be reasonable and necessary in order to provide the required intensity of service to ensure the patient's safety. The patient's presenting symptoms, physical exam findings, and initial radiographic and laboratory data in the context of their medical condition is felt to place them at decreased risk for further clinical deterioration. Furthermore, it is anticipated that the patient will be medically stable for discharge from the hospital within 2 midnights of admission.   Author: Adelee Hannula, DO 01/14/2024 7:42 AM  For on call review [email or web address].

## 2024-01-14 NOTE — Evaluation (Signed)
 Occupational Therapy Evaluation Patient Details Name: [name] MRN: [id number] DOB: [birth year] Today's Date: 01/14/2024   History of Present Illness   [name] is a 73 y/o female with history of frequent falls on Plavix . Last fall last week hit left side of head, bruised with HA since. Fall this morning resulted in rt shoulder pain with "pop." Pt denies LOC. (per DO)     Clinical Impressions Pt agreeable to OT and PT co-evaluation. Pt came from SNF which she was only at for a couple days. Pt reports continued pain in R UE. Sling adjusted today for better fit. Pt is requiring min A for bed mobility and CGA to min A for ambulation. Much assist for ADL's given NWB status of R UE. Pt left in the bed nurse notified of mobility status by PT. Pt will benefit from continued OT in the hospital and recommended venue below to increase strength, balance, and endurance for safe ADL's.        If plan is discharge home, recommend the following:   A little help with walking and/or transfers;A lot of help with bathing/dressing/bathroom;Assistance with cooking/housework;Assist for transportation;Help with stairs or ramp for entrance     Functional Status Assessment   Patient has had a recent decline in their functional status and demonstrates the ability to make significant improvements in function in a reasonable and predictable amount of time.     Equipment Recommendations   None recommended by OT             Precautions/Restrictions   Precautions Precautions: Fall Recall of Precautions/Restrictions: Intact Required Braces or Orthoses: Sling Restrictions Weight Bearing Restrictions Per Provider Order: Yes RUE Weight Bearing Per Provider Order: Non weight bearing     Mobility Bed Mobility Overal bed mobility: Needs Assistance Bed Mobility: Supine to Sit     Supine to sit: Min assist     General bed mobility comments: labored movement; single hand held  assist.    Transfers Overall transfer level: Needs assistance Equipment used: Rolling walker (2 wheels), 1 person hand held assist Transfers: Bed to chair/wheelchair/BSC, Sit to/from Stand Sit to Stand: Min assist, Contact guard assist     Step pivot transfers: Min assist, Contact guard assist     General transfer comment: labored movement; assist to manage RW      Balance Overall balance assessment: Needs assistance Sitting-balance support: Feet unsupported, No upper extremity supported Sitting balance-Leahy Scale: Fair Sitting balance - Comments: fair/good seated at EOB   Standing balance support: Single extremity supported, During functional activity Standing balance-Leahy Scale: Fair Standing balance comment: using Left hand on RW                           ADL either performed or assessed with clinical judgement   ADL Overall ADL's : Needs assistance/impaired Eating/Feeding: Set up;Sitting   Grooming: Moderate assistance;Sitting   Upper Body Bathing: Moderate assistance;Maximal assistance;Sitting   Lower Body Bathing: Moderate assistance;Maximal assistance;Sitting/lateral leans   Upper Body Dressing : Moderate assistance;Sitting   Lower Body Dressing: Moderate assistance;Maximal assistance;Sitting/lateral leans   Toilet Transfer: Contact guard assist;Minimal assistance;Stand-pivot;Ambulation;Rolling walker (2 wheels) Toilet Transfer Details (indicate cue type and reason): Simulated via EOB to chair with RW and ambulation in the hall with RW. Min A to help manage RW since pt only has functional use of L UE. Toileting- Clothing Manipulation and Hygiene: Moderate assistance;Maximal assistance;Sitting/lateral lean  Functional mobility during ADLs: Contact guard assist;Minimal assistance;Rolling walker (2 wheels) General ADL Comments: Able to ambulate in hall with assist to propel RW since pt can only use L UE.     Vision Baseline Vision/History: 1  Wears glasses Ability to See in Adequate Light: 1 Impaired Patient Visual Report: No change from baseline Vision Assessment?: No apparent visual deficits     Perception Perception: Not tested       Praxis Praxis: Not tested       Pertinent Vitals/Pain Pain Assessment Pain Assessment: Faces Faces Pain Scale: Hurts little more Pain Location: R shoulder Pain Descriptors / Indicators: Guarding, Grimacing Pain Intervention(s): Limited activity within patient's tolerance, Monitored during session, Repositioned     Extremity/Trunk Assessment Upper Extremity Assessment Upper Extremity Assessment: RUE deficits/detail RUE Deficits / Details: NWB in sling; sling adjusted for better positioning and fit. LUE Deficits / Details: General weakness but good functional use.   Lower Extremity Assessment Lower Extremity Assessment: Defer to PT evaluation   Cervical / Trunk Assessment Cervical / Trunk Assessment: Normal   Communication Communication Communication: No apparent difficulties   Cognition Arousal: Alert Behavior During Therapy: WFL for tasks assessed/performed Cognition: No apparent impairments                               Following commands: Intact       Cueing  General Comments                     Home Living Family/patient expects to be discharged to:: Private residence Living Arrangements: Alone Available Help at Discharge: Family;Available PRN/intermittently Type of Home: Apartment Home Access: Elevator     Home Layout: One level     Bathroom Shower/Tub: Chief Strategy Officer: Handicapped height Bathroom Accessibility: Yes   Home Equipment: Cane - single point;Rollator (4 wheels);Shower seat   Additional Comments: Pt came from SNF for this admission. Only there a couple days.      Prior Functioning/Environment Prior Level of Function : Independent/Modified Independent;Driving             Mobility Comments:  Community ambulation using SPC PRN, drives ADLs Comments: Independent    OT Problem List: Decreased strength;Decreased activity tolerance;Impaired balance (sitting and/or standing);Impaired UE functional use   OT Treatment/Interventions: Self-care/ADL training;Therapeutic exercise;DME and/or AE instruction;Therapeutic activities;Patient/family education;Balance training      OT Goals(Current goals can be found in the care plan section)   Acute Rehab OT Goals Patient Stated Goal: improve function OT Goal Formulation: With patient Time For Goal Achievement: 01/28/24 Potential to Achieve Goals: Good   OT Frequency:  Min 2X/week    Co-evaluation PT/OT/SLP Co-Evaluation/Treatment: Yes Reason for Co-Treatment: To address functional/ADL transfers   OT goals addressed during session: ADL's and self-care      AM-PAC OT "6 Clicks" Daily Activity     Outcome Measure Help from another person eating meals?: A Little Help from another person taking care of personal grooming?: A Little Help from another person toileting, which includes using toliet, bedpan, or urinal?: A Lot Help from another person bathing (including washing, rinsing, drying)?: A Lot Help from another person to put on and taking off regular upper body clothing?: A Lot Help from another person to put on and taking off regular lower body clothing?: A Lot 6 Click Score: 14   End of Session Equipment Utilized During Treatment: Rolling walker (2 wheels) Nurse Communication:  Mobility status  Activity Tolerance: Patient tolerated treatment well Patient left: in bed  OT Visit Diagnosis: Unsteadiness on feet (R26.81);Other abnormalities of gait and mobility (R26.89);Muscle weakness (generalized) (M62.81);Repeated falls (R29.6);History of falling (Z91.81)                Time: 1610-9604 OT Time Calculation (min): 13 min Charges:  OT General Charges $OT Visit: 1 Visit OT Evaluation $OT Eval Low Complexity: 1 Low  [name] OT, MOT  [name] 01/14/2024, 9:07 AM

## 2024-01-14 NOTE — Plan of Care (Signed)
  Problem: Acute Rehab OT Goals (only OT should resolve) Goal: Pt. Will Perform Grooming Flowsheets (Taken 01/14/2024 0909) Pt Will Perform Grooming:  with modified independence  sitting Goal: Pt. Will Perform Lower Body Bathing Flowsheets (Taken 01/14/2024 0909) Pt Will Perform Lower Body Bathing:  with min assist  with adaptive equipment  with contact guard assist  sitting/lateral leans Goal: Pt. Will Perform Upper Body Dressing Flowsheets (Taken 01/14/2024 0909) Pt Will Perform Upper Body Dressing:  with modified independence  sitting Goal: Pt. Will Perform Lower Body Dressing Flowsheets (Taken 01/14/2024 0909) Pt Will Perform Lower Body Dressing:  with min assist  with adaptive equipment Goal: Pt. Will Transfer To Toilet Flowsheets (Taken 01/14/2024 [phone]) Pt Will Transfer to Toilet: with modified independence Goal: Pt. Will Perform Toileting-Clothing Manipulation Flowsheets (Taken 01/14/2024 0909) Pt Will Perform Toileting - Clothing Manipulation and hygiene: with modified independence Goal: Pt/Caregiver Will Perform Home Exercise Program Flowsheets (Taken 01/14/2024 0909) Pt/caregiver will Perform Home Exercise Program:  Increased strength  Left upper extremity  Independently  Jireh Vinas OT, MOT

## 2024-01-14 NOTE — Procedures (Signed)
 Patient Name: [name]  MRN: [id number]  Epilepsy Attending: Arleene Lack  Referring Physician/Provider: Carin Charleston, MD  Date: 01/14/2024 Duration: 27.29 mins  Patient history: 73 year old female with repeated episodes of fall, seizure-like episode seen by family yesterday as well as previous episodes of "talking gibberish" transient confusion. EEG to evaluate for seizure  Level of alertness: Awake  AEDs during EEG study: None  Technical aspects: This EEG study was done with scalp electrodes positioned according to the 10-20 International system of electrode placement. Electrical activity was reviewed with band pass filter of 1-70Hz , sensitivity of 7 uV/mm, display speed of 3mm/sec with a 60Hz  notched filter applied as appropriate. EEG data were recorded continuously and digitally stored.  Video monitoring was available and reviewed as appropriate.  Description: The posterior dominant rhythm consists of 8 Hz activity of moderate voltage (25-35 uV) seen predominantly in posterior head regions, symmetric and reactive to eye opening and eye closing. Hyperventilation and photic stimulation were not performed.     IMPRESSION: This study is within normal limits. No seizures or epileptiform discharges were seen throughout the recording.  A normal interictal EEG does not exclude the diagnosis of epilepsy.  [name]

## 2024-01-14 NOTE — Progress Notes (Signed)
 EEG complete - results pending

## 2024-01-14 NOTE — Consult Note (Signed)
 I connected with  [name] on 01/14/24 by a video enabled telemedicine application and verified that I am speaking with the correct person using two identifiers.   I discussed the limitations of evaluation and management by telemedicine. The patient expressed understanding and agreed to proceed.  Location of patient: [facility] Location of physician: [facility]   Neurology Consultation Reason for Consult: Seizure-like episode Referring Physician: Dr. Twilla Galea  CC: Fall, confusion, seizure-like episode  History is obtained from: Patient, son at bedside, chart review  HPI: [name] is a 73 y.o. female with past medical history of hypertension, hyperlipidemia, type 2 diabetes, prior TIA, coronary artery disease status post CABG in May 2024 who presented with altered mental status, fall and seizure-like activity.  Per son at bedside, patient has been having multiple falls most recently 2 falls in the last 2 weeks.  He also states that yesterday when he went to see patient at the nursing facility, she was confused and "talking of the wall".  For example she was talking about a family member being sick who passed away couple weeks ago, seeing things on the TV that were not there, unable to remember name of people that she has known for a long time.  While at the facility her sister also noticed her having a seizure-like episode.  Patient states she does feel lightheaded before the falls.  As for yesterday's episode, she thinks she was confused before the seizure-like episode and talking gibberish.  States she has had similar episodes in the past maybe once a month.  Per chart review she was seen in 2016 for an episode of "talking gibberish" and at that time reported like her throat was closing up and symptoms were attributed to GERD.  When patient was reminded of this, she stated even this time she felt like she had trouble breathing.  Patient's son reported the family  member that died recently was diagnosed with Clarkson's disease.  Of note, per chart review patient worked with PT this morning but is currently denying any memory of that.  ROS: All other systems reviewed and negative except as noted in the HPI.   Past Medical History:  Diagnosis Date   Anxiety    CAD (coronary artery disease)    DES to circumflex 02/2007, BMS to LAD and PTCA diagonal 03/2007   Carotid artery plaque    Mild   Cataract    Depression    Diverticulitis, colon    Elevated d-dimer 01/08/2014   Essential hypertension, benign    GERD (gastroesophageal reflux disease)    H/O hiatal hernia    HLD (hyperlipidemia)    IDDM (insulin  dependent diabetes mellitus)    Migraine    "used to have them really bad; don't have them anymore" (01/07/2014)   MS (multiple sclerosis) (HCC)    Not confirmed   PAT (paroxysmal atrial tachycardia) (HCC)    Prolapse of uterus    PVD (peripheral vascular disease) (HCC)    TIA (transient ischemic attack) 1980's    Family History  Problem Relation Age of Onset   Heart attack Mother 15   Diabetes Mother    Hypertension Mother    Heart attack Father 28   Heart attack Brother 32       x 6   Heart disease Brother    Diabetes Brother    Colon cancer Paternal Aunt        10s, died with brain anuerysm  Crohn's disease Cousin        paternal   Diabetes Sister    GER disease Daughter    Cervical cancer Daughter    Diabetes Daughter     Social History:  reports that she has never smoked. She has never used smokeless tobacco. She reports that she does not drink alcohol and does not use drugs.  Medications Prior to Admission  Medication Sig Dispense Refill Last Dose/Taking   aspirin  81 MG chewable tablet Chew 1 tablet (81 mg total) by mouth daily. 90 tablet 3    aspirin -acetaminophen -caffeine (EXCEDRIN MIGRAINE) 250-250-65 MG tablet Take 1 tablet by mouth every 6 (six) hours as needed for headache.      Blood Glucose Monitoring Suppl DEVI 1  each by Does not apply route in the morning, at noon, and at bedtime. May substitute to any manufacturer covered by patient's insurance. 1 each 0    clopidogrel  (PLAVIX ) 75 MG tablet Take 1 tablet (75 mg total) by mouth daily with breakfast. 90 tablet 3    Continuous Glucose Sensor (FREESTYLE LIBRE 3 SENSOR) MISC PLACE 1 SENSOR ON THE SKIN EVERY 14 DAYS 6 each 3    diphenhydrAMINE  (BENADRYL ) 25 mg capsule Take 25 mg by mouth every 6 (six) hours as needed for allergies.      furosemide  (LASIX ) 20 MG tablet Take 1 tablet (20 mg total) by mouth daily. 90 tablet 1    insulin  aspart (NOVOLOG  FLEXPEN) 100 UNIT/ML FlexPen Inject 36 Units into the skin 3 (three) times daily with meals. (Patient taking differently: Inject 25-36 Units into the skin 3 (three) times daily with meals.)      insulin  degludec (TRESIBA ) 200 UNIT/ML FlexTouch Pen Inject 30-60 Units into the skin daily. Pt states she takes 60 units if she eats and 30 units if she does not eat      levocetirizine (XYZAL ) 5 MG tablet Take 0.5 tablets (2.5 mg total) by mouth every evening. 90 tablet 3    lisinopril  (ZESTRIL ) 10 MG tablet Take 1 tablet (10 mg total) by mouth daily. 90 tablet 3    metoprolol  tartrate (LOPRESSOR ) 25 MG tablet Take 2 tablets (50 mg total) by mouth 2 (two) times daily. 60 tablet 2    nitroGLYCERIN  (NITROSTAT ) 0.4 MG SL tablet DISSOLVE 1 TABLET UNDER TONGUE FOR CHESTPAIN.MAY REPEAT EVERY 5 MINUTES FOR 3 DOSES.IF NO RELIEF CALL 911 OR GO TO ER 25 tablet 3    potassium chloride  (KLOR-CON ) 10 MEQ tablet TAKE ONE (1) TABLET BY MOUTH TWO (2) TIMES DAILY (Patient taking differently: Take 10 mEq by mouth daily.) 180 tablet 0    rOPINIRole  (REQUIP ) 0.5 MG tablet Take 2 tablets (1 mg total) by mouth daily as needed (restless leg).      rosuvastatin  (CRESTOR ) 20 MG tablet TAKE ONE (1) TABLET BY MOUTH EVERY DAY 90 tablet 0    Semaglutide , 2 MG/DOSE, 8 MG/3ML SOPN Inject 2 mg as directed once a week. (Patient not taking: Reported on  01/08/2024)      sertraline  (ZOLOFT ) 50 MG tablet Take 1 tablet (50 mg total) by mouth daily. 90 tablet 3    traZODone  (DESYREL ) 50 MG tablet Take 1 tablet (50 mg total) by mouth at bedtime as needed for sleep. 90 tablet 3       Exam: Current vital signs: BP 130/61 (BP Location: Left Arm)   Pulse 88   Temp 98 F (36.7 C) (Oral)   Resp 17   Ht 5\' 6"  (1.676 m)  Wt 75 kg   SpO2 100%   BMI 26.69 kg/m  Vital signs in last 24 hours: Temp:  [98 F (36.7 C)-98.6 F (37 C)] 98 F (36.7 C) (04/22 0916) Pulse Rate:  [78-91] 88 (04/22 0916) Resp:  [16-18] 17 (04/22 0916) BP: (102-173)/(60-116) 130/61 (04/22 0916) SpO2:  [93 %-100 %] 100 % (04/22 0916) Weight:  [75 kg] 75 kg (04/21 2013)   Physical Exam  Constitutional: Appears well-developed and well-nourished.  Psych: Affect appropriate to situation Neuro: AO x 3, cranial nerves grossly intact, antigravity strength without drift in all 4 extremities, sensory intact to light touch, FTN intact bilaterally   I have reviewed labs in epic and the results pertinent to this consultation are: CBC:  Recent Labs  Lab 01/08/24 0905 01/13/24 2122 01/14/24 0508  WBC 11.1* 8.6 8.0  NEUTROABS 9.5* 5.4  --   HGB 12.9 12.3 11.5*  HCT 39.2 35.9* 35.1*  MCV 91.8 89.1 91.9  PLT 137* 191 159    Basic Metabolic Panel:  Lab Results  Component Value Date   NA 135 01/14/2024   K 4.3 01/14/2024   CO2 27 01/14/2024   GLUCOSE 332 (H) 01/14/2024   BUN 17 01/14/2024   CREATININE 1.13 (H) 01/14/2024   CALCIUM  9.2 01/14/2024   GFRNONAA 51 (L) 01/14/2024   GFRAA 53 (L) 10/17/2020   Lipid Panel:  Lab Results  Component Value Date   LDLCALC 49 01/14/2024   HgbA1c:  Lab Results  Component Value Date   HGBA1C 9.1 (H) 11/28/2023   Urine Drug Screen:     Component Value Date/Time   LABOPIA NONE DETECTED 09/21/2017 2136   COCAINSCRNUR NONE DETECTED 09/21/2017 2136   LABBENZ POSITIVE (A) 09/21/2017 2136   AMPHETMU NONE DETECTED 09/21/2017  2136   THCU NONE DETECTED 09/21/2017 2136   LABBARB NONE DETECTED 09/21/2017 2136    Alcohol Level     Component Value Date/Time   ETH <10 09/21/2017 2137     I have reviewed the images obtained:  CT Head without contrast 01/13/2024: No acute intracranial abnormality.   MRI Brain without contrast 01/14/2024: No evidence of an acute intracranial abnormality.  Mild-to-moderate chronic small vessel ischemic changes within the cerebral white matter and pons, similar to the prior brain MRI of 11/28/2023. Mild-to-moderate generalized cerebral atrophy.   ASSESSMENT/PLAN: 73 year old female with repeated episodes of fall, seizure-like episode seen by family yesterday as well as previous episodes of "talking gibberish" transient confusion  Transient alteration of awareness Seizure-like episode Falls - Patient orthostatic vital signs are positive.  She was slightly hyponatremic with mild AKI.  Therefore some of her symptoms could be secondary to hypoperfusion and hypovolemia. - MRI brain did not show any acute abnormality or any lesions that could potentially cause seizures.  EEG is ordered and pending.  Given the description of symptoms, I am hesitant to start antiseizure medications unless we see any ictal abnormality on EEG - I will also check labs for reversible causes of dementia like B12, folate, TSH, RPR, ammonia.  Patient has had recent deaths in the family, has had multiple falls and has been in and out of the hospitals and currently nursing facility.  It is possible that patient has had mild dementia which has now manifested with all these changes in her daily life - If the above workup is negative, can consider checking cortisol levels, TTE and a heart monitor as further workup of repeated falls - If episodes of speech disturbance and confusion persist, may  need to bring to [facility] for [facility] EEG - Discussed plan with patient and son at bedside -Discussed plan with Dr.  Quintella Buck via secure chat   Thank you for allowing us  to participate in the care of this patient. If you have any further questions, please contact  me or neurohospitalist.   Roxy Cordial Epilepsy [facility]

## 2024-01-14 NOTE — Care Management Obs Status (Signed)
 MEDICARE OBSERVATION STATUS NOTIFICATION   Patient Details  Name: [name] MRN: [id number] Date of Birth: May 19, 1951   Medicare Observation Status Notification Given:  Yes    Geraldina Klinefelter, RN 01/14/2024, 6:47 PM

## 2024-01-14 NOTE — Telephone Encounter (Signed)
 Copied from CRM [id number]. Topic: Referral - Request for Referral >> Jan 09, 2024 11:17 AM Carlatta H wrote: Did the patient discuss referral with their provider in the last year? No (If No - schedule appointment) (If Yes - send message)  Appointment offered? No  Type of order/referral and detailed reason for visit: Patient has been having fall  Preference of office, provider, location: Neurologist//[place]  If referral order, have you been seen by this specialty before? Yes (If Yes, this issue or another issue? When? Where?  Can we respond through MyChart? No

## 2024-01-14 NOTE — [place] FL2 (Signed)
 [place]  MEDICAID FL2 LEVEL OF CARE FORM     IDENTIFICATION  Patient Name: [name] Birthdate: 07/06/1951 Sex: female Admission Date (Current Location): 01/13/2024  [facility] and [place] Number:  [employer] and Address:  [facility],  [address], [place] [address]      Provider Number: [id number]  Attending Physician Name and Address:  Colin Dawley, MD  Relative Name and Phone Number:  Ether Hercules [phone]    Current Level of Care: Hospital Recommended Level of Care: Skilled Nursing Facility Prior Approval Number:    Date Approved/Denied:   PASRR Number: [id number]  Discharge Plan: SNF    Current Diagnoses: Patient Active Problem List   Diagnosis Date Noted   Seizure-like activity (HCC) 01/14/2024   Slurred speech 01/14/2024   Clavicular fracture 01/14/2024   Hyperosmolar hyperglycemic state (HHS) (HCC) 11/28/2023   Acute renal failure superimposed on stage 3b chronic kidney disease (HCC) 11/28/2023   Hypertensive urgency 11/28/2023   Stage 3b chronic kidney disease (HCC) 10/09/2023   Vertigo 05/13/2023   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   Unstable angina (HCC) 02/12/2023   Chest pain 03/26/2022   Nausea and vomiting 03/26/2022   Incomplete emptying of bladder 02/20/2022   Restless leg syndrome 01/09/2021   Insomnia due to medical condition 01/09/2021   Congestive heart failure (HCC) 11/30/2020   Depression, recurrent (HCC) 11/02/2020   Recurrent UTI 10/11/2020   Generalized weakness 10/11/2020   Recurrent falls 10/11/2020   QT prolongation 10/11/2020   Osteopenia after menopause 12/13/2017   H/O prolonged Q-T interval on ECG 03/06/2017   Vitamin B12 deficiency 01/13/2016   History of stroke 09/01/2015   History of TIA (transient ischemic attack) 08/14/2014   Uncontrolled type 2 diabetes mellitus with hyperglycemia, with long-term current use of insulin  (HCC) 08/14/2014   Cataract 02/08/2014   Macular degeneration 02/08/2014    Accelerating angina (HCC) 01/07/2014   Generalized anxiety disorder 09/14/2013   Claudication (HCC) 06/18/2013   Gastroesophageal reflux disease without esophagitis 01/01/2012   Hypokalemia 08/24/2011   Type 2 diabetes mellitus with hyperglycemia (HCC) 08/23/2011   Essential hypertension 08/23/2011   PALPITATIONS 02/10/2010   Mixed hyperlipidemia 05/10/2009   Coronary artery disease involving native coronary artery of native heart with unstable angina pectoris (HCC) 08/26/2008    Orientation RESPIRATION BLADDER Height & Weight     Self, Time, Situation, Place  Normal Continent Weight: 165 lb 5.5 oz (75 kg) Height:  5\' 6"  (167.6 cm)  BEHAVIORAL SYMPTOMS/MOOD NEUROLOGICAL BOWEL NUTRITION STATUS      Continent Diet (See DC summary)  AMBULATORY STATUS COMMUNICATION OF NEEDS Skin   Extensive Assist Verbally Normal                       Personal Care Assistance Level of Assistance  Bathing, Feeding, Dressing Bathing Assistance: Limited assistance Feeding assistance: Independent Dressing Assistance: Limited assistance     Functional Limitations Info  Sight, Hearing, Speech Sight Info: Impaired (wears eyeglasses) Hearing Info: Adequate Speech Info: Adequate    SPECIAL CARE FACTORS FREQUENCY  PT (By licensed PT), OT (By licensed OT)     PT Frequency: 5 x a week OT Frequency: 5 x a week            Contractures Contractures Info: Not present    Additional Factors Info  Code Status Code Status Info: Full             Current Medications (01/14/2024):  This is  the current hospital active medication list Current Facility-Administered Medications  Medication Dose Route Frequency Provider Last Rate Last Admin   acetaminophen  (TYLENOL ) tablet 650 mg  650 mg Oral Q6H PRN Adefeso, Oladapo, DO       Or   acetaminophen  (TYLENOL ) suppository 650 mg  650 mg Rectal Q6H PRN Adefeso, Oladapo, DO       aspirin  EC tablet 81 mg  81 mg Oral Q breakfast Emokpae, Courage, MD        atorvastatin  (LIPITOR ) tablet 40 mg  40 mg Oral Daily Emokpae, Courage, MD       insulin  aspart (novoLOG ) injection 0-15 Units  0-15 Units Subcutaneous TID WC Adefeso, Oladapo, DO   11 Units at 01/14/24 2130   insulin  aspart (novoLOG ) injection 0-5 Units  0-5 Units Subcutaneous QHS Adefeso, Oladapo, DO       insulin  glargine-yfgn (SEMGLEE ) injection 10 Units  10 Units Subcutaneous QHS Adefeso, Oladapo, DO       oxyCODONE  (Oxy IR/ROXICODONE ) immediate release tablet 5 mg  5 mg Oral Q4H PRN Adefeso, Oladapo, DO       prochlorperazine  (COMPAZINE ) injection 10 mg  10 mg Intravenous Q6H PRN Adefeso, Oladapo, DO         Discharge Medications: Please see discharge summary for a list of discharge medications.  Relevant Imaging Results:  Relevant Lab Results:   Additional Information SSN: [id number]  Cyndie Dredge, [place]

## 2024-01-14 NOTE — Progress Notes (Signed)
 Patient seen and evaluated, chart reviewed, please see EMR for updated orders. Please see full H&P dictated by admitting physician Dr. Adefeso for same date of service.   Brief Summary:-  73 y.o. female with medical history significant of ronary artery disease status post CABG 02/19/23, hypertension, hyperlipidemia, diabetes mellitus type 2, TIA, depression, atrial tachycardia admitted on 01/14/2024 with  A/p 1)Recurrent Falls--stroke workup including MRI brain negative -concerns for possible orthostatic hypotension a.m. cortisol level pending Physical therapy and Occupational Therapy eval appreciated recommends SNF rehab Recurrent falls---PTA pt lived alone and did very poorly, patient has significant limitations with mobility related ADLs- this patient needs to continue to be monitored in the hospital until a SNF bed is obtained as she is not safe to go home with her current physcical limitations  2)Possible seizures--- please see full neurology consult from Dr. Merceda Stairs =-EEG pending -Neurologist recommends holding off on antiseizure medications for now - 3)Right clavicular fracture--- due to recurrent falls, see #1 above - Continue sling  4)HTN--- resume PTA metoprolol  and lisinopril   5)H/o prior strokes with mild-to-moderate chronic small vessel ischemic disease----imaging study at this time shows no new acute strokes -Continue PTA  aspirin , Plavix  and atorvastatin  for secondary prevention  6)DM2-A1c 9.1 reflecting uncontrolled DM with hyperglycemia PTA -Continue scheduled semglee  insulin  Use Novolog /Humalog  Sliding scale insulin  with Accu-Cheks/Fingersticks as ordered   - Patient son was present during teleneurology evaluation -Case discussed with neurologist Dr. Merceda Stairs recommendations appreciated - Care time 38 minutes  Patient seen and evaluated, chart reviewed, please see EMR for updated orders. Please see full H&P dictated by admitting physician Dr. Adefeso for same date of  service.   Karen Dawley, MD

## 2024-01-14 NOTE — TOC Initial Note (Addendum)
 Transition of Care [facility]) - Initial/Assessment Note    Patient Details  Name: [name] MRN: [id number] Date of Birth: 1951-03-17  [facility] CM/SW Contact:    Juanda Noon Phone Number: 01/14/2024, 11:41 AM  Clinical Narrative:                  CSW spoke with patient at bedside about PT recommendation. Patient is agreeable to SNF, wants to go back to [facility]. Margretta Shi updated and able to accept. Attempted to call [employer] regarding auth . TOC to follow.    Expected Discharge Plan: Skilled Nursing Facility Barriers to Discharge: Continued Medical Work up   Patient Goals and CMS Choice Patient states their goals for this hospitalization and ongoing recovery are:: DC to SNF for short-term rehab CMS Medicare.gov Compare Post Acute Care list provided to:: Patient Choice offered to / list presented to : Patient      Expected Discharge Plan and Services In-house Referral: Clinical Social Work Discharge Planning Services: CM Consult Post Acute Care Choice: Durable Medical Equipment Living arrangements for the past 2 months: Apartment                                      Prior Living Arrangements/Services Living arrangements for the past 2 months: Apartment Lives with:: Self Patient language and need for interpreter reviewed:: Yes Do you feel safe going back to the place where you live?: Yes        Care giver support system in place?: No (comment) Current home services: DME Criminal Activity/Legal Involvement Pertinent to Current Situation/Hospitalization: No - Comment as needed  Activities of Daily Living   ADL Screening (condition at time of admission) Independently performs ADLs?: No Does the patient have a NEW difficulty with bathing/dressing/toileting/self-feeding that is expected to last >3 days?: Yes (Initiates electronic notice to provider for possible OT consult) Does the patient have a NEW difficulty with getting in/out of  bed, walking, or climbing stairs that is expected to last >3 days?: No Does the patient have a NEW difficulty with communication that is expected to last >3 days?: No Is the patient deaf or have difficulty hearing?: No Does the patient have difficulty seeing, even when wearing glasses/contacts?: No Does the patient have difficulty concentrating, remembering, or making decisions?: No  Permission Sought/Granted      Share Information with NAME: Tanecia     Permission granted to share info w Relationship: Patient     Emotional Assessment Appearance:: Appears stated age Attitude/Demeanor/Rapport: Engaged Affect (typically observed): Accepting, Stable Orientation: : Oriented to Self, Oriented to Place, Oriented to  Time, Oriented to Situation Alcohol / Substance Use: Not Applicable Psych Involvement: No (comment)  Admission diagnosis:  Frequent falls [R29.6] Seizure-like activity (HCC) [R56.9] Patient Active Problem List   Diagnosis Date Noted   Seizure-like activity (HCC) 01/14/2024   Slurred speech 01/14/2024   Clavicular fracture 01/14/2024   Hyperosmolar hyperglycemic state (HHS) (HCC) 11/28/2023   Acute renal failure superimposed on stage 3b chronic kidney disease (HCC) 11/28/2023   Hypertensive urgency 11/28/2023   Stage 3b chronic kidney disease (HCC) 10/09/2023   Vertigo 05/13/2023   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   Unstable angina (HCC) 02/12/2023   Chest pain 03/26/2022   Nausea and vomiting 03/26/2022   Incomplete emptying of bladder 02/20/2022   Restless leg syndrome 01/09/2021   Insomnia due to medical  condition 01/09/2021   Congestive heart failure (HCC) 11/30/2020   Depression, recurrent (HCC) 11/02/2020   Recurrent UTI 10/11/2020   Generalized weakness 10/11/2020   Recurrent falls 10/11/2020   QT prolongation 10/11/2020   Osteopenia after menopause 12/13/2017   H/O prolonged Q-T interval on ECG 03/06/2017   Vitamin B12 deficiency 01/13/2016   History of stroke  09/01/2015   History of TIA (transient ischemic attack) 08/14/2014   Uncontrolled type 2 diabetes mellitus with hyperglycemia, with long-term current use of insulin  (HCC) 08/14/2014   Cataract 02/08/2014   Macular degeneration 02/08/2014   Accelerating angina (HCC) 01/07/2014   Generalized anxiety disorder 09/14/2013   Claudication (HCC) 06/18/2013   Gastroesophageal reflux disease without esophagitis 01/01/2012   Hypokalemia 08/24/2011   Type 2 diabetes mellitus with hyperglycemia (HCC) 08/23/2011   Essential hypertension 08/23/2011   PALPITATIONS 02/10/2010   Mixed hyperlipidemia 05/10/2009   Coronary artery disease involving native coronary artery of native heart with unstable angina pectoris (HCC) 08/26/2008   PCP:  Albertha Huger, FNP Pharmacy:   [facility] [place], [place] - [address] [place] [address] Phone: [phone] Fax: [phone]     Social Drivers of Health (SDOH) Social History: SDOH Screenings   Food Insecurity: No Food Insecurity (01/14/2024)  Housing: Low Risk  (01/14/2024)  Transportation Needs: No Transportation Needs (01/14/2024)  Utilities: Not At Risk (01/14/2024)  Alcohol Screen: Low Risk  (01/24/2023)  Depression (PHQ2-9): Low Risk  (10/09/2023)  Recent Concern: Depression (PHQ2-9) - Medium Risk (08/27/2023)  Financial Resource Strain: Low Risk  (01/24/2023)  Physical Activity: Insufficiently Active (01/24/2023)  Social Connections: Moderately Integrated (01/14/2024)  Recent Concern: Social Connections - Moderately Isolated (11/29/2023)  Stress: No Stress Concern Present (01/24/2023)  Tobacco Use: Low Risk  (01/13/2024)   SDOH Interventions:     Readmission Risk Interventions     No data to display

## 2024-01-14 NOTE — Plan of Care (Signed)
  Problem: Acute Rehab PT Goals(only PT should resolve) Goal: Pt Will Go Supine/Side To Sit Outcome: Progressing Flowsheets (Taken 01/14/2024 1227) Pt will go Supine/Side to Sit:  with contact guard assist  with minimal assist Goal: Patient Will Transfer Sit To/From Stand Outcome: Progressing Flowsheets (Taken 01/14/2024 1227) Patient will transfer sit to/from stand:  with contact guard assist  with minimal assist Goal: Pt Will Transfer Bed To Chair/Chair To Bed Outcome: Progressing Flowsheets (Taken 01/14/2024 1227) Pt will Transfer Bed to Chair/Chair to Bed: with contact guard assist Goal: Pt Will Ambulate Outcome: Progressing Flowsheets (Taken 01/14/2024 1227) Pt will Ambulate:  75 feet  with contact guard assist  with minimal assist  with cane   12:28 PM, 01/14/24 Walton Guppy, MPT Physical Therapist with [facility] [phone] office [phone] mobile phone

## 2024-01-14 NOTE — Evaluation (Signed)
 Physical Therapy Evaluation Patient Details Name: [name] MRN: [id number] DOB: 05-21-1951 Today's Date: 01/14/2024  History of Present Illness  [name]. Cordial is a 73 y/o female with history of frequent falls on Plavix . Last fall last week hit left side of head, bruised with HA since. Fall this morning resulted in rt shoulder pain with "pop." Pt denies LOC.  Clinical Impression  Patient demonstrates slow labored movement for sitting up at bedside with RUE in sling, labored movement for transferring to/from chair, tolerated walking in hallway with slow labored movement and limited mostly due to fatigue.  Patient will benefit from continued skilled physical therapy in hospital and recommended venue below to increase strength, balance, endurance for safe ADLs and gait.          If plan is discharge home, recommend the following: A little help with walking and/or transfers;A little help with bathing/dressing/bathroom;Assistance with cooking/housework;Help with stairs or ramp for entrance   Can travel by private vehicle   Yes    Equipment Recommendations None recommended by PT  Recommendations for Other Services       Functional Status Assessment Patient has had a recent decline in their functional status and demonstrates the ability to make significant improvements in function in a reasonable and predictable amount of time.     Precautions / Restrictions Precautions Precautions: Fall Recall of Precautions/Restrictions: Intact Required Braces or Orthoses: Sling Restrictions Weight Bearing Restrictions Per Provider Order: Yes RUE Weight Bearing Per Provider Order: Non weight bearing      Mobility  Bed Mobility Overal bed mobility: Needs Assistance Bed Mobility: Supine to Sit     Supine to sit: Min assist     General bed mobility comments: increased time, labored movement    Transfers Overall transfer level: Needs assistance Equipment used: Rolling walker (2  wheels), 1 person hand held assist Transfers: Bed to chair/wheelchair/BSC, Sit to/from Stand Sit to Stand: Min assist, Contact guard assist   Step pivot transfers: Min assist, Contact guard assist       General transfer comment: unsteady labored movement    Ambulation/Gait Ambulation/Gait assistance: Min assist, Mod assist Gait Distance (Feet): 35 Feet Assistive device: Rolling walker (2 wheels), 1 person hand held assist Gait Pattern/deviations: Decreased step length - right, Decreased step length - left, Decreased stride length Gait velocity: decreased     General Gait Details: slow unsteady labored movement supporting self with left hand holding onto RW, limited mostly due to fatigue and generalized weakness  Stairs            Wheelchair Mobility     Tilt Bed    Modified Rankin (Stroke Patients Only)       Balance Overall balance assessment: Needs assistance Sitting-balance support: No upper extremity supported Sitting balance-Leahy Scale: Fair Sitting balance - Comments: fair/good seated at EOB   Standing balance support: Single extremity supported, During functional activity Standing balance-Leahy Scale: Fair Standing balance comment: using Left hand on RW                             Pertinent Vitals/Pain Pain Assessment Pain Assessment: Faces Faces Pain Scale: Hurts little more Pain Location: R shoulder Pain Descriptors / Indicators: Guarding, Grimacing Pain Intervention(s): Limited activity within patient's tolerance, Monitored during session, Repositioned    Home Living Family/patient expects to be discharged to:: Private residence Living Arrangements: Alone Available Help at Discharge: Family;Available PRN/intermittently Type of Home: Apartment Home  Access: Elevator       Home Layout: One level Home Equipment: Cane - single point;Rollator (4 wheels);Shower seat Additional Comments: Pt came from SNF for this admission. Only there  a couple days.    Prior Function Prior Level of Function : Independent/Modified Independent;Driving             Mobility Comments: Community ambulation using SPC PRN, drives ADLs Comments: Independent     Extremity/Trunk Assessment   Upper Extremity Assessment Upper Extremity Assessment: Defer to OT evaluation RUE Deficits / Details: NWB in sling; sling adjusted for better positioning and fit. LUE Deficits / Details: General weakness but good functional use.    Lower Extremity Assessment Lower Extremity Assessment: Generalized weakness    Cervical / Trunk Assessment Cervical / Trunk Assessment: Normal  Communication   Communication Communication: No apparent difficulties    Cognition Arousal: Alert Behavior During Therapy: WFL for tasks assessed/performed   PT - Cognitive impairments: No apparent impairments                         Following commands: Intact       Cueing Cueing Techniques: Verbal cues, Tactile cues     General Comments      Exercises     Assessment/Plan    PT Assessment Patient needs continued PT services  PT Problem List Decreased strength;Decreased activity tolerance;Decreased range of motion;Decreased balance;Decreased mobility       PT Treatment Interventions DME instruction;Gait training;Stair training;Functional mobility training;Therapeutic activities;Therapeutic exercise;Balance training;Patient/family education    PT Goals (Current goals can be found in the Care Plan section)  Acute Rehab PT Goals Patient Stated Goal: Return home with family to assist PT Goal Formulation: With patient Time For Goal Achievement: 01/28/24 Potential to Achieve Goals: Good    Frequency Min 3X/week     Co-evaluation PT/OT/SLP Co-Evaluation/Treatment: Yes Reason for Co-Treatment: To address functional/ADL transfers PT goals addressed during session: Mobility/safety with mobility;Balance;Proper use of DME OT goals addressed during  session: ADL's and self-care       AM-PAC PT "6 Clicks" Mobility  Outcome Measure Help needed turning from your back to your side while in a flat bed without using bedrails?: A Little Help needed moving from lying on your back to sitting on the side of a flat bed without using bedrails?: A Little Help needed moving to and from a bed to a chair (including a wheelchair)?: A Little Help needed standing up from a chair using your arms (e.g., wheelchair or bedside chair)?: A Little Help needed to walk in hospital room?: A Little Help needed climbing 3-5 steps with a railing? : A Lot 6 Click Score: 17    End of Session   Activity Tolerance: Patient tolerated treatment well;Patient limited by fatigue Patient left: in bed;with call bell/phone within reach Nurse Communication: Mobility status PT Visit Diagnosis: Unsteadiness on feet (R26.81);Other abnormalities of gait and mobility (R26.89);Muscle weakness (generalized) (M62.81);History of falling (Z91.81)    Time: 1610-9604 PT Time Calculation (min) (ACUTE ONLY): 24 min   Charges:   PT Evaluation $PT Eval Moderate Complexity: 1 Mod PT Treatments $Therapeutic Activity: 23-37 mins PT General Charges $$ ACUTE PT VISIT: 1 Visit         12:26 PM, 01/14/24 Walton Guppy, MPT Physical Therapist with [facility] 336 [phone] office [phone] mobile phone

## 2024-01-15 ENCOUNTER — Encounter: Payer: Self-pay | Admitting: Family Medicine

## 2024-01-15 DIAGNOSIS — S42001D Fracture of unspecified part of right clavicle, subsequent encounter for fracture with routine healing: Secondary | ICD-10-CM | POA: Diagnosis not present

## 2024-01-15 DIAGNOSIS — Z794 Long term (current) use of insulin: Secondary | ICD-10-CM | POA: Diagnosis not present

## 2024-01-15 DIAGNOSIS — E1165 Type 2 diabetes mellitus with hyperglycemia: Secondary | ICD-10-CM | POA: Diagnosis not present

## 2024-01-15 DIAGNOSIS — R569 Unspecified convulsions: Secondary | ICD-10-CM | POA: Diagnosis not present

## 2024-01-15 DIAGNOSIS — I10 Essential (primary) hypertension: Secondary | ICD-10-CM | POA: Diagnosis not present

## 2024-01-15 DIAGNOSIS — E538 Deficiency of other specified B group vitamins: Secondary | ICD-10-CM | POA: Diagnosis not present

## 2024-01-15 DIAGNOSIS — R296 Repeated falls: Secondary | ICD-10-CM | POA: Diagnosis not present

## 2024-01-15 DIAGNOSIS — E782 Mixed hyperlipidemia: Secondary | ICD-10-CM | POA: Diagnosis not present

## 2024-01-15 LAB — GLUCOSE, CAPILLARY
Glucose-Capillary: 303 mg/dL — ABNORMAL HIGH (ref 70–99)
Glucose-Capillary: 426 mg/dL — ABNORMAL HIGH (ref 70–99)
Glucose-Capillary: 259 mg/dL — ABNORMAL HIGH (ref 70–99)
Glucose-Capillary: 206 mg/dL — ABNORMAL HIGH (ref 70–99)

## 2024-01-15 LAB — HIV ANTIBODY (ROUTINE TESTING W REFLEX): HIV Screen 4th Generation wRfx: NONREACTIVE

## 2024-01-15 LAB — RPR: RPR Ser Ql: NONREACTIVE

## 2024-01-15 LAB — CORTISOL-AM, BLOOD: Cortisol - AM: 15.8 ug/dL (ref 6.7–22.6)

## 2024-01-15 MED ORDER — CYANOCOBALAMIN 1000 MCG/ML IJ SOLN
1000.0000 ug | Freq: Every day | INTRAMUSCULAR | Status: DC
  Administered 2024-01-15 – 2024-01-16 (×2): 1000 ug via INTRAMUSCULAR
  Filled 2024-01-15 (×2): qty 1

## 2024-01-15 MED ORDER — INSULIN ASPART 100 UNIT/ML IJ SOLN
20.0000 [IU] | Freq: Once | INTRAMUSCULAR | Status: AC
  Administered 2024-01-15: 20 [IU] via SUBCUTANEOUS

## 2024-01-15 MED ORDER — LIDOCAINE 5 % EX PTCH
1.0000 | MEDICATED_PATCH | CUTANEOUS | Status: DC
  Administered 2024-01-15: 1 via TRANSDERMAL
  Filled 2024-01-15: qty 1

## 2024-01-15 MED ORDER — INSULIN GLARGINE-YFGN 100 UNIT/ML ~~LOC~~ SOLN
20.0000 [IU] | Freq: Every day | SUBCUTANEOUS | Status: DC
  Administered 2024-01-15: 20 [IU] via SUBCUTANEOUS
  Filled 2024-01-15 (×2): qty 0.2

## 2024-01-15 NOTE — Progress Notes (Signed)
 Mobility Specialist Progress Note:    01/15/24 1203  Mobility  Activity Ambulated with assistance to bathroom  Level of Assistance Contact guard assist, steadying assist  Assistive Device None  Distance Ambulated (ft) 20 ft  Range of Motion/Exercises Active;Left arm;Right leg;Left leg;Passive;Right arm  RUE Weight Bearing Per Provider Order NWB  Activity Response Tolerated well  Mobility visit 1 Mobility  Mobility Specialist Start Time (ACUTE ONLY) 1130  Mobility Specialist Stop Time (ACUTE ONLY) 1145  Mobility Specialist Time Calculation (min) (ACUTE ONLY) 15 min   Pt received requesting assistance to bathroom. Required CGA to stand and ambulate with no AD. Tolerated well, RUE NWB. Returned pt supine, alarm on. All needs met.  Glinda Lapping Mobility Specialist Please contact via Special educational needs teacher or  Rehab office at [phone]

## 2024-01-15 NOTE — Inpatient Diabetes Management (Signed)
 Inpatient Diabetes Program Recommendations  AACE/ADA: New Consensus Statement on Inpatient Glycemic Control (2015)  Target Ranges:  Prepandial:   less than 140 mg/dL      Peak postprandial:   less than 180 mg/dL (1-2 hours)      Critically ill patients:  140 - 180 mg/dL   Lab Results  Component Value Date   GLUCAP 303 (H) 01/15/2024   HGBA1C 9.1 (H) 11/28/2023    Latest Reference Range & Units 01/14/24 08:37 01/14/24 11:28 01/14/24 16:31 01/14/24 20:31 01/15/24 07:16  Glucose-Capillary 70 - 99 mg/dL 161 (H) 096 (H) 045 (H) 317 (H) 303 (H)  (H): Data is abnormally high   Diabetes history: DM2 Outpatient Diabetes medications: Tresiba  60 units if eating (takes 30 units if not eating), Novolog  25-36 units TID with meals, Ozempic  2 mg Qweek Current orders for Inpatient glycemic control: Semglee  10 units daily, Novolog  0-15 units TID with meals, Novolog  0-5 units QHS   Inpatient Diabetes Program Recommendations:     Please consider: -Increase Semglee  20 units every day  Thank you, Taila Basinski E. Cathe Bilger, RN, MSN, CDCES  Diabetes Coordinator Inpatient Glycemic Control Team Team Pager [phone] (8am-5pm) 01/15/2024 8:18 AM

## 2024-01-15 NOTE — Assessment & Plan Note (Signed)
-   A1c in the 9 range demonstrating poor control - Hyperglycemia appreciated at time of admission; most likely contributing to patient's dehydration. - Continue sliding scale insulin  and adjusted dose of Semglee  - Patient will require further adjustment of her hypoglycemic agents based on CBG fluctuation.

## 2024-01-15 NOTE — Assessment & Plan Note (Signed)
-   EEG negative - Following neurology recommendations no antiepileptic agents will be started - Continue supportive care and maintain adequate hydration.

## 2024-01-15 NOTE — Assessment & Plan Note (Signed)
-   Continue using sling - As needed analgesics - Outpatient follow-up with orthopedic service - Continue rehabilitation.

## 2024-01-15 NOTE — Assessment & Plan Note (Signed)
-   Continue treatment with Crestor  -Heart healthy diet discussed with patient.

## 2024-01-15 NOTE — Telephone Encounter (Signed)
 LMTCB to schedule appt

## 2024-01-15 NOTE — Assessment & Plan Note (Addendum)
-   Continue current antihypertensive agents - Follow vital signs and further adjust medication as needed - Heart healthy/low-sodium diet discussed with patient. -Currently receiving lisinopril , metoprolol  and Lasix

## 2024-01-15 NOTE — TOC Progression Note (Signed)
 Transition of Care [facility]) - Progression Note    Patient Details  Name: [name] MRN: [id number] Date of Birth: 1951/07/01  Transition of Care [facility]) CM/SW Contact  Orelia Binet, RN Phone Number: 01/15/2024, 1:26 PM  Clinical Narrative:   Confirmed with [facility] has started Auth, Adding EMS request to Auth. [name] is pending. TOC following.     Expected Discharge Plan: Skilled Nursing Facility Barriers to Discharge: Continued Medical Work up  Expected Discharge Plan and Services In-house Referral: Clinical Social Work Discharge Planning Services: CM Consult Post Acute Care Choice: Durable Medical Equipment Living arrangements for the past 2 months: Apartment                        Social Determinants of Health (SDOH) Interventions SDOH Screenings   Food Insecurity: No Food Insecurity (01/14/2024)  Housing: Low Risk  (01/14/2024)  Transportation Needs: No Transportation Needs (01/14/2024)  Utilities: Not At Risk (01/14/2024)  Alcohol Screen: Low Risk  (01/24/2023)  Depression (PHQ2-9): Low Risk  (10/09/2023)  Recent Concern: Depression (PHQ2-9) - Medium Risk (08/27/2023)  Financial Resource Strain: Low Risk  (01/24/2023)  Physical Activity: Insufficiently Active (01/24/2023)  Social Connections: Moderately Integrated (01/14/2024)  Recent Concern: Social Connections - Moderately Isolated (11/29/2023)  Stress: No Stress Concern Present (01/24/2023)  Tobacco Use: Low Risk  (01/13/2024)    Readmission Risk Interventions     No data to display

## 2024-01-15 NOTE — Assessment & Plan Note (Signed)
-   Evaluated by physical therapy with recommendations for a skilled nursing facility placement. - TOC assisting with discharge plans.

## 2024-01-15 NOTE — Assessment & Plan Note (Signed)
-   B12 level 138 - Aggressive supplementation and anticipated monthly injection for maintenance will be pursuit. - Continue supportive care.

## 2024-01-15 NOTE — Progress Notes (Signed)
 Progress Note   Patient: [name] UJW:[id number] DOB: [birth year] DOA: 01/13/2024     0 DOS: the patient was seen and examined on 01/15/2024   Brief hospital admission narrative: As per H&P written by Dr. Elyse Hand on 01/14/2024 [name] is a 73 y.o. female with medical history significant of ronary artery disease status post CABG 02/19/23, hypertension, hyperlipidemia, diabetes mellitus type 2, TIA, depression, atrial tachycardia who presents to the emergency department for evaluation of shoulder pain.  Patient complained of multiple falls as well as questionable seizure activity yesterday.  Patient was in the rehab facility for frequent falls, she was visited by her sister yesterday who witnessed her having tonic-clonic seizure activity and some slurred speech afterwards.  She was confused for some time after the seizure activity, but was back to her baseline by the time she got to the ED.  Apparently, patient was reported to have violent intermittent confusion since she was at nursing facility. 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   ED Course:  In the emergency department, BP was 166/116, other vital signs were within normal range.  Workup in the ED showed normal CBC except for hematocrit of 35.9.  BMP was normal except for sodium of 132, chloride 95, blood glucose 239, creatinine 1.18 (baseline creatinine 0.9-1.1).  Urinalysis was positive for glycosuria and moderate hematuria. CT head without contrast showed no acute ventricular abnormality CT cervical spine showed no acute fracture or static subluxation of the cervical spine. Right shoulder showed increased displacement and overriding of known right midclavicular fracture She was treated with Norco.  Hospitalist was asked to admit patient for further evaluation and management.  Assessment and plan  Essential hypertension - Continue current antihypertensive agents - Follow  vital signs and further adjust medication as needed - Heart healthy/low-sodium diet discussed with patient. -Currently receiving lisinopril , metoprolol  and Lasix   Seizure-like activity (HCC) - EEG negative - Following neurology recommendations no antiepileptic agents will be started - Continue supportive care and maintain adequate hydration.  Mixed hyperlipidemia - Continue treatment with Crestor  -Heart healthy diet discussed with patient.  Type 2 diabetes mellitus with hyperglycemia (HCC) - A1c in the 9 range demonstrating poor control - Hyperglycemia appreciated at time of admission; most likely contributing to patient's dehydration. - Continue sliding scale insulin  and adjusted dose of Semglee  - Patient will require further adjustment of her hypoglycemic agents based on CBG fluctuation.  Recurrent falls - Evaluated by physical therapy with recommendations for a skilled nursing facility placement. - TOC assisting with discharge plans.  Clavicular fracture - Continue using sling - As needed analgesics - Outpatient follow-up with orthopedic service - Continue rehabilitation.  B12 deficiency - B12 level 138 - Aggressive supplementation and anticipated monthly injection for maintenance will be pursuit. - Continue supportive care.  Subjective:  No chest pain, no nausea, no vomiting.  Following commands appropriately.  Reports feeling weak.  Physical Exam: Vitals:   01/14/24 1402 01/14/24 1744 01/14/24 2028 01/15/24 0347  BP: (!) 155/66  (!) 153/73 (!) 149/57  Pulse: 88  93 74  Resp: 17 20    Temp: 98.2 F (36.8 C)  98 F (36.7 C) 97.8 F (36.6 C)  TempSrc:   Oral Oral  SpO2: 96%  96% 97%  Weight:      Height:       General exam: Alert, awake, following commands appropriately.  No acute distress. Respiratory system: Clear to  auscultation.  No using accessory muscle.  Good saturation on room air. Cardiovascular system:RRR. No rubs or gallops; no JVD. Gastrointestinal  system: Abdomen is nondistended, soft and nontender. No organomegaly or masses felt. Normal bowel sounds heard. Central nervous system: No focal neurological deficits. Extremities: No cyanosis or clubbing; right upper extremity with sling in place. Skin: No petechiae. Psychiatry: Judgement and insight appear normal. Mood & affect appropriate.    Data Reviewed: B12: 138 Cortisol: 15.8 RPR: Pending HIV antibody: Nonreactive   Family Communication: No family at bedside.  Disposition: Status is: Observation The patient remains OBS appropriate and will d/c before 2 midnights.   Time spent: 50 minutes  Author: Justina Oman, MD 01/15/2024 1:23 PM  For on call review [email or web address].

## 2024-01-16 DIAGNOSIS — D649 Anemia, unspecified: Secondary | ICD-10-CM | POA: Diagnosis not present

## 2024-01-16 DIAGNOSIS — E118 Type 2 diabetes mellitus with unspecified complications: Secondary | ICD-10-CM | POA: Insufficient documentation

## 2024-01-16 DIAGNOSIS — R296 Repeated falls: Secondary | ICD-10-CM | POA: Diagnosis not present

## 2024-01-16 DIAGNOSIS — Z794 Long term (current) use of insulin: Secondary | ICD-10-CM | POA: Diagnosis not present

## 2024-01-16 DIAGNOSIS — M6281 Muscle weakness (generalized): Secondary | ICD-10-CM | POA: Diagnosis not present

## 2024-01-16 DIAGNOSIS — R1319 Other dysphagia: Secondary | ICD-10-CM | POA: Diagnosis not present

## 2024-01-16 DIAGNOSIS — S0990XD Unspecified injury of head, subsequent encounter: Secondary | ICD-10-CM | POA: Diagnosis not present

## 2024-01-16 DIAGNOSIS — R29818 Other symptoms and signs involving the nervous system: Secondary | ICD-10-CM | POA: Diagnosis not present

## 2024-01-16 DIAGNOSIS — R569 Unspecified convulsions: Secondary | ICD-10-CM | POA: Diagnosis not present

## 2024-01-16 DIAGNOSIS — E785 Hyperlipidemia, unspecified: Secondary | ICD-10-CM | POA: Diagnosis not present

## 2024-01-16 DIAGNOSIS — S42021D Displaced fracture of shaft of right clavicle, subsequent encounter for fracture with routine healing: Secondary | ICD-10-CM | POA: Diagnosis not present

## 2024-01-16 DIAGNOSIS — I10 Essential (primary) hypertension: Secondary | ICD-10-CM | POA: Diagnosis not present

## 2024-01-16 DIAGNOSIS — E538 Deficiency of other specified B group vitamins: Secondary | ICD-10-CM | POA: Diagnosis not present

## 2024-01-16 DIAGNOSIS — R5381 Other malaise: Secondary | ICD-10-CM | POA: Diagnosis not present

## 2024-01-16 DIAGNOSIS — E559 Vitamin D deficiency, unspecified: Secondary | ICD-10-CM | POA: Diagnosis not present

## 2024-01-16 DIAGNOSIS — E1169 Type 2 diabetes mellitus with other specified complication: Secondary | ICD-10-CM | POA: Insufficient documentation

## 2024-01-16 DIAGNOSIS — E1165 Type 2 diabetes mellitus with hyperglycemia: Secondary | ICD-10-CM | POA: Diagnosis not present

## 2024-01-16 DIAGNOSIS — E782 Mixed hyperlipidemia: Secondary | ICD-10-CM | POA: Diagnosis not present

## 2024-01-16 DIAGNOSIS — E119 Type 2 diabetes mellitus without complications: Secondary | ICD-10-CM | POA: Diagnosis not present

## 2024-01-16 DIAGNOSIS — R262 Difficulty in walking, not elsewhere classified: Secondary | ICD-10-CM | POA: Diagnosis not present

## 2024-01-16 DIAGNOSIS — D518 Other vitamin B12 deficiency anemias: Secondary | ICD-10-CM | POA: Diagnosis not present

## 2024-01-16 DIAGNOSIS — S42001D Fracture of unspecified part of right clavicle, subsequent encounter for fracture with routine healing: Secondary | ICD-10-CM | POA: Diagnosis not present

## 2024-01-16 DIAGNOSIS — I509 Heart failure, unspecified: Secondary | ICD-10-CM | POA: Diagnosis not present

## 2024-01-16 DIAGNOSIS — N1831 Chronic kidney disease, stage 3a: Secondary | ICD-10-CM | POA: Insufficient documentation

## 2024-01-16 DIAGNOSIS — W19XXXD Unspecified fall, subsequent encounter: Secondary | ICD-10-CM | POA: Diagnosis not present

## 2024-01-16 DIAGNOSIS — I2510 Atherosclerotic heart disease of native coronary artery without angina pectoris: Secondary | ICD-10-CM | POA: Diagnosis not present

## 2024-01-16 DIAGNOSIS — F339 Major depressive disorder, recurrent, unspecified: Secondary | ICD-10-CM | POA: Diagnosis not present

## 2024-01-16 LAB — GLUCOSE, CAPILLARY: Glucose-Capillary: 259 mg/dL — ABNORMAL HIGH (ref 70–99)

## 2024-01-16 MED ORDER — NOVOLOG FLEXPEN 100 UNIT/ML ~~LOC~~ SOPN: 0.0000 [IU] | PEN_INJECTOR | Freq: Three times a day (TID) | SUBCUTANEOUS | Status: DC

## 2024-01-16 MED ORDER — METOPROLOL TARTRATE 25 MG PO TABS: 25.0000 mg | ORAL_TABLET | Freq: Two times a day (BID) | ORAL | 2 refills | Status: DC

## 2024-01-16 MED ORDER — OXYCODONE HCL 5 MG PO TABS: 5.0000 mg | ORAL_TABLET | Freq: Three times a day (TID) | ORAL | 0 refills | Status: DC | PRN

## 2024-01-16 MED ORDER — INSULIN GLARGINE 100 UNIT/ML ~~LOC~~ SOLN: 18.0000 [IU] | Freq: Two times a day (BID) | SUBCUTANEOUS | Status: DC

## 2024-01-16 MED ORDER — CYANOCOBALAMIN 1000 MCG/ML IJ SOLN: INTRAMUSCULAR | 1 refills | Status: DC

## 2024-01-16 NOTE — Discharge Summary (Signed)
 Physician Discharge Summary   Patient: [name] MRN: [id number] DOB: 04-27-1951  Admit date:     01/13/2024  Discharge date: 01/16/24  Discharge Physician: Justina Oman   PCP: Albertha Huger, FNP   Recommendations at discharge:  Repeat basic metabolic panel to follow ultralights renal function Reassess blood sugar and further adjust hypoglycemic regimen as needed Repeat CBC to follow hemoglobin trend/stability Reassess blood pressure and adjust antihypertensive treatment as required. Outpatient follow-up with neurology service for further evaluation regarding memory loss and potential dementia development recommended (in about 6 weeks or so). Make sure patient follow-up with orthopedic service in 4 weeks for further evaluation and reassessment of clavicular fracture.  Discharge Diagnoses: Principal Problem:   Seizure-like activity (HCC) Active Problems:   Essential hypertension   Mixed hyperlipidemia   Type 2 diabetes mellitus with hyperglycemia (HCC)   B12 deficiency   Recurrent falls   Slurred speech   Clavicular fracture   Brief Hospital admission narrative: As per H&P written by Dr. Elyse Hand on 01/14/2024 [name] is a 73 y.o. female with medical history significant of ronary artery disease status post CABG 02/19/23, hypertension, hyperlipidemia, diabetes mellitus type 2, TIA, depression, atrial tachycardia who presents to the emergency department for evaluation of shoulder pain.  Patient complained of multiple falls as well as questionable seizure activity yesterday.  Patient was in the rehab facility for frequent falls, she was visited by her sister yesterday who witnessed her having tonic-clonic seizure activity and some slurred speech afterwards.  She was confused for some time after the seizure activity, but was back to her baseline by the time she got to the ED.  Apparently, patient was reported to have violent intermittent confusion since she was at nursing  facility. 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   ED Course:  In the emergency department, BP was 166/116, other vital signs were within normal range.  Workup in the ED showed normal CBC except for hematocrit of 35.9.  BMP was normal except for sodium of 132, chloride 95, blood glucose 239, creatinine 1.18 (baseline creatinine 0.9-1.1).  Urinalysis was positive for glycosuria and moderate hematuria. CT head without contrast showed no acute ventricular abnormality CT cervical spine showed no acute fracture or static subluxation of the cervical spine. Right shoulder showed increased displacement and overriding of known right midclavicular fracture She was treated with Norco.  Hospitalist was asked to admit patient for further evaluation and management.  Assessment and Plan: Essential hypertension - Continue current antihypertensive agents - Follow vital signs and further adjust medication as needed - Heart healthy/low-sodium diet discussed with patient. -Currently receiving lisinopril , metoprolol  and Lasix . - Reassesses Blood pressure at follow-up visit with further adjustment to antihypertensive agents as required.   Seizure-like activity (HCC) - EEG negative - Following neurology recommendations no antiepileptic agents will be started - Continue supportive care and maintain adequate hydration. -Outpatient follow-up with neurology service recommended.   Mixed hyperlipidemia - Continue treatment with Crestor  -Heart healthy diet discussed with patient.   Type 2 diabetes mellitus with hyperglycemia (HCC) - A1c in the 9 range demonstrating poor control - Hyperglycemia appreciated at time of admission; most likely contributing to patient's dehydration. - Continue sliding scale insulin  and adjusted dose of insulin  - Modified Kawatu diet discussed with patient. - Continue follow-up patient's CBGs fluctuation with further  adjustment on hypoglycemic regimen as required.   Recurrent falls - Evaluated by physical  therapy with recommendations for a skilled nursing facility placement. - Patient will be discharged to [facility] skilled nursing home for further care and rehabilitation.   Clavicular fracture - Continue using sling - As needed analgesics - Outpatient follow-up with orthopedic service as recommended. - Continue rehabilitation.   B12 deficiency - B12 level 138 - Aggressive supplementation and anticipated monthly injection for maintenance will be pursuit. - Continue supportive care.  Consultants: Neurology service Procedures performed: See below for x-ray report; EEG not demonstrating seizure activity. Disposition: Skilled nursing facility Diet recommendation: Heart healthy/modified carbohydrate diet.  DISCHARGE MEDICATION: Allergies as of 01/16/2024       Reactions   Iohexol      Desc: pt had syncopal episode with nausea post IV CM late 1990's,  pt has had prednisone  prep with heart caths x 2 without problem  kdean 04/16/07, Onset Date: [id number]   Ticlid [ticlopidine Hcl] Nausea And Vomiting   Jardiance  [empagliflozin ] Other (See Comments)   Recurrent UTIs   Metformin  And Related Diarrhea   Codeine Nausea And Vomiting, Palpitations        Medication List     STOP taking these medications    diphenhydrAMINE  25 mg capsule Commonly known as: BENADRYL    Semaglutide  (2 MG/DOSE) 8 MG/3ML Sopn       TAKE these medications    acetaminophen  500 MG tablet Commonly known as: TYLENOL  Take 1,000 mg by mouth every 6 (six) hours as needed for mild pain (pain score 1-3) or headache.   aspirin  81 MG chewable tablet Chew 1 tablet (81 mg total) by mouth daily.   Blood Glucose Monitoring Suppl Devi 1 each by Does not apply route in the morning, at noon, and at bedtime. May substitute to any manufacturer covered by patient's insurance.   clopidogrel  75 MG tablet Commonly known as: PLAVIX  Take 1  tablet (75 mg total) by mouth daily with breakfast.   cyanocobalamin  1000 MCG/ML injection Commonly known as: VITAMIN B12 Inject 1 mL daily for 5 more days; then weekly x 1 month and then monthly after that. Start taking on: January 17, 2024   FreeStyle Libre 3 Sensor Misc PLACE 1 SENSOR ON THE SKIN EVERY 14 DAYS   furosemide  20 MG tablet Commonly known as: LASIX  Take 1 tablet (20 mg total) by mouth daily.   insulin  glargine 100 UNIT/ML injection Commonly known as: LANTUS  Inject 0.18 mLs (18 Units total) into the skin 2 (two) times daily. What changed:  how much to take when to take this   lisinopril  10 MG tablet Commonly known as: ZESTRIL  Take 1 tablet (10 mg total) by mouth daily.   loratadine  10 MG tablet Commonly known as: CLARITIN  Take 10 mg by mouth daily.   metoprolol  tartrate 25 MG tablet Commonly known as: LOPRESSOR  Take 1 tablet (25 mg total) by mouth 2 (two) times daily. What changed:  medication strength how much to take   nitroGLYCERIN  0.4 MG SL tablet Commonly known as: NITROSTAT  DISSOLVE 1 TABLET UNDER TONGUE FOR CHESTPAIN.MAY REPEAT EVERY 5 MINUTES FOR 3 DOSES.IF NO RELIEF CALL 911 OR GO TO ER   NovoLOG  FlexPen 100 UNIT/ML FlexPen Generic drug: insulin  aspart Inject 0-6 Units into the skin 3 (three) times daily with meals. Inject per sliding scale 0-200 = 0 units, if less than 70 notify provider 201-250 = 2 units 251-300 = 3 units 301-350 = 4 units 351-400 = 5 units 401-450 = 6 units >450 = notify provider   oxyCODONE  5 MG immediate release tablet Commonly  known as: Oxy IR/ROXICODONE  Take 1 tablet (5 mg total) by mouth every 8 (eight) hours as needed for severe pain (pain score 7-10).   potassium chloride  10 MEQ tablet Commonly known as: KLOR-CON  TAKE ONE (1) TABLET BY MOUTH TWO (2) TIMES DAILY What changed: See the new instructions.   rOPINIRole  1 MG tablet Commonly known as: REQUIP  Take 1 mg by mouth daily as needed (restless legs).    rosuvastatin  20 MG tablet Commonly known as: CRESTOR  TAKE ONE (1) TABLET BY MOUTH EVERY DAY   sertraline  50 MG tablet Commonly known as: ZOLOFT  Take 1 tablet (50 mg total) by mouth daily.   traZODone  50 MG tablet Commonly known as: DESYREL  Take 1 tablet (50 mg total) by mouth at bedtime as needed for sleep.        Contact information for follow-up providers     Albertha Huger, FNP. Schedule an appointment as soon as possible for a visit in 10 day(s).   Specialty: Family Medicine Contact information: [address] [place] [address] [phone]         Darrin Emerald, MD. Schedule an appointment as soon as possible for a visit in 4 week(s).   Specialties: Orthopedic Surgery, Radiology Contact information: [address] [place] [address] [phone]              Contact information for after-discharge care     Destination     [facility] .   Service: Skilled Nursing Contact information: [address] [facility] [place]  [id number] [phone]                    Discharge Exam: [name] Weights   01/13/24 2013  Weight: 75 kg   General exam: Alert, awake, following commands appropriately.  No acute distress. Respiratory system: Clear to auscultation.  No using accessory muscle.  Good saturation on room air. Cardiovascular system:RRR. No rubs or gallops; no JVD. Gastrointestinal system: Abdomen is nondistended, soft and nontender. No organomegaly or masses felt. Normal bowel sounds heard. Central nervous system: No focal neurological deficits. Extremities: No cyanosis or clubbing; right upper extremity with sling in place. Skin: No petechiae. Psychiatry: Judgement and insight appear normal. Mood & affect appropriate.     Condition at discharge: Stable and improved.  The results of significant diagnostics from this hospitalization (including imaging, microbiology, ancillary and laboratory) are  listed below for reference.   Imaging Studies: EEG adult Result Date: 01/14/2024 Arleene Lack, MD     01/14/2024  8:12 PM Patient Name: [name] MRN: [id number] Epilepsy Attending: Arleene Lack Referring Physician/Provider: Carin Charleston, MD Date: 01/14/2024 Duration: 27.29 mins Patient history: 73 year old female with repeated episodes of fall, seizure-like episode seen by family yesterday as well as previous episodes of "talking gibberish" transient confusion. EEG to evaluate for seizure Level of alertness: Awake AEDs during EEG study: None Technical aspects: This EEG study was done with scalp electrodes positioned according to the 10-20 International system of electrode placement. Electrical activity was reviewed with band pass filter of 1-70Hz , sensitivity of 7 uV/mm, display speed of 55mm/sec with a 60Hz  notched filter applied as appropriate. EEG data were recorded continuously and digitally stored.  Video monitoring was available and reviewed as appropriate. Description: The posterior dominant rhythm consists of 8 Hz activity of moderate voltage (25-35 uV) seen predominantly in posterior head regions, symmetric and reactive to eye opening and eye closing. Hyperventilation and photic stimulation were not  performed.   IMPRESSION: This study is within normal limits. No seizures or epileptiform discharges were seen throughout the recording. A normal interictal EEG does not exclude the diagnosis of epilepsy. Arleene Lack   MR BRAIN WO CONTRAST Result Date: 01/14/2024 CLINICAL DATA:  Provided history: Headache, increasing frequency or severity. EXAM: MRI HEAD WITHOUT CONTRAST TECHNIQUE: Multiplanar, multiecho pulse sequences of the brain and surrounding structures were obtained without intravenous contrast. COMPARISON:  Head CT 01/13/2024.  Brain MRI 11/28/2023. FINDINGS: Brain: Mild-to-moderate generalized cerebral atrophy. Prominence of the ventricles and sulci, which appears commensurate.  Multifocal T2 FLAIR hyperintense signal abnormality within the cerebral white matter and pons, nonspecific but compatible with mild-to-moderate chronic small vessel ischemic disease. There is no acute infarct. No evidence of an intracranial mass. No chronic intracranial blood products. No extra-axial fluid collection. No midline shift. Vascular: Maintained flow voids within the proximal large arterial vessels. Skull and upper cervical spine: No focal worrisome marrow lesion. Sinuses/Orbits: No mass or acute finding within the imaged orbits. Prior bilateral ocular lens replacement. Minimal mucosal thickening or small mucous retention cyst within the right maxillary sinus. IMPRESSION: 1. No evidence of an acute intracranial abnormality. 2. Mild-to-moderate chronic small vessel ischemic changes within the cerebral white matter and pons, similar to the prior brain MRI of 11/28/2023. 3. Mild-to-moderate generalized cerebral atrophy. 4. Mild right maxillary sinus disease, as described. Electronically Signed   By: Bascom Lily D.O.   On: 01/14/2024 08:38   DG Shoulder Right Result Date: 01/14/2024 CLINICAL DATA:  Fall EXAM: RIGHT SHOULDER - 2+ VIEW COMPARISON:  01/08/2024 FINDINGS: Increased displacement and overriding right mid clavicular fracture. Mild degenerative change of the [facility] joint. No fracture or malalignment of the humeral head. IMPRESSION: Increased displacement and overriding of known right mid clavicular fracture. Electronically Signed   By: Esmeralda Hedge M.D.   On: 01/14/2024 00:02   DG Chest Portable 1 View Result Date: 01/14/2024 CLINICAL DATA:  Low oxygen EXAM: PORTABLE CHEST 1 VIEW COMPARISON:  11/28/2023 FINDINGS: Sternotomy. Stable cardiomediastinal silhouette. No acute airspace disease, pleural effusion or pneumothorax. Acute appearing displaced and overriding fracture of the mid right clavicle. IMPRESSION: No active disease. Redemonstrated acute displaced and overriding right clavicle fracture  Electronically Signed   By: Esmeralda Hedge M.D.   On: 01/14/2024 00:01   CT Head Wo Contrast Result Date: 01/13/2024 CLINICAL DATA:  Headache and seizure EXAM: CT HEAD WITHOUT CONTRAST CT CERVICAL SPINE WITHOUT CONTRAST TECHNIQUE: Multidetector CT imaging of the head and cervical spine was performed following the standard protocol without intravenous contrast. Multiplanar CT image reconstructions of the cervical spine were also generated. RADIATION DOSE REDUCTION: This exam was performed according to the departmental dose-optimization program which includes automated exposure control, adjustment of the mA and/or kV according to patient size and/or use of iterative reconstruction technique. COMPARISON:  01/08/2024 FINDINGS: CT HEAD FINDINGS Brain: There is no mass, hemorrhage or extra-axial collection. The appearance of the white matter is normal for the patient's age. There is generalized atrophy. Vascular: Atherosclerotic calcification of the internal carotid arteries at the skull base. No abnormal hyperdensity of the major intracranial arteries or dural venous sinuses. Skull: The visualized skull base, calvarium and extracranial soft tissues are normal. Sinuses/Orbits: No fluid levels or advanced mucosal thickening of the visualized paranasal sinuses. No mastoid or middle ear effusion. Normal orbits. Other: None. CT CERVICAL SPINE FINDINGS Alignment: No static subluxation. Facets are aligned. Occipital condyles are normally positioned. Skull base and vertebrae: No acute fracture. Soft tissues  and spinal canal: No prevertebral fluid or swelling. No visible canal hematoma. Disc levels: Mild spinal canal stenosis at C5-6 and C6-7 due to disc osteophyte complexes. Upper chest: No pneumothorax, pulmonary nodule or pleural effusion. Other: Normal visualized paraspinal cervical soft tissues. IMPRESSION: 1. No acute intracranial abnormality. 2. No acute fracture or static subluxation of the cervical spine. 3. Mild  spinal canal stenosis at C5-6 and C6-7 due to disc osteophyte complexes. Electronically Signed   By: Juanetta Nordmann M.D.   On: 01/13/2024 23:47   CT Cervical Spine Wo Contrast Result Date: 01/13/2024 CLINICAL DATA:  Headache and seizure EXAM: CT HEAD WITHOUT CONTRAST CT CERVICAL SPINE WITHOUT CONTRAST TECHNIQUE: Multidetector CT imaging of the head and cervical spine was performed following the standard protocol without intravenous contrast. Multiplanar CT image reconstructions of the cervical spine were also generated. RADIATION DOSE REDUCTION: This exam was performed according to the departmental dose-optimization program which includes automated exposure control, adjustment of the mA and/or kV according to patient size and/or use of iterative reconstruction technique. COMPARISON:  01/08/2024 FINDINGS: CT HEAD FINDINGS Brain: There is no mass, hemorrhage or extra-axial collection. The appearance of the white matter is normal for the patient's age. There is generalized atrophy. Vascular: Atherosclerotic calcification of the internal carotid arteries at the skull base. No abnormal hyperdensity of the major intracranial arteries or dural venous sinuses. Skull: The visualized skull base, calvarium and extracranial soft tissues are normal. Sinuses/Orbits: No fluid levels or advanced mucosal thickening of the visualized paranasal sinuses. No mastoid or middle ear effusion. Normal orbits. Other: None. CT CERVICAL SPINE FINDINGS Alignment: No static subluxation. Facets are aligned. Occipital condyles are normally positioned. Skull base and vertebrae: No acute fracture. Soft tissues and spinal canal: No prevertebral fluid or swelling. No visible canal hematoma. Disc levels: Mild spinal canal stenosis at C5-6 and C6-7 due to disc osteophyte complexes. Upper chest: No pneumothorax, pulmonary nodule or pleural effusion. Other: Normal visualized paraspinal cervical soft tissues. IMPRESSION: 1. No acute intracranial  abnormality. 2. No acute fracture or static subluxation of the cervical spine. 3. Mild spinal canal stenosis at C5-6 and C6-7 due to disc osteophyte complexes. Electronically Signed   By: Juanetta Nordmann M.D.   On: 01/13/2024 23:47   DG Shoulder Right Portable Result Date: 01/08/2024 CLINICAL DATA:  Right shoulder pain status post fall EXAM: RIGHT CLAVICLE - 2+ VIEWS; RIGHT SHOULDER - 1 VIEW COMPARISON:  07/03/2016 FINDINGS: Right clavicle: Mildly displaced oblique fracture of the mid right clavicle. Visualized soft tissues are unremarkable. Right shoulder: No additional fracture or dislocation. Mild-to-moderate degenerative changes of the acromioclavicular joint. Irregularity of the greater tuberosity indicative of rotator cuff tendinopathy. IMPRESSION: Mildly displaced oblique fracture of the mid right clavicle. Electronically Signed   By: Elester Grim M.D.   On: 01/08/2024 10:34   DG Clavicle Right Result Date: 01/08/2024 CLINICAL DATA:  Right shoulder pain status post fall EXAM: RIGHT CLAVICLE - 2+ VIEWS; RIGHT SHOULDER - 1 VIEW COMPARISON:  07/03/2016 FINDINGS: Right clavicle: Mildly displaced oblique fracture of the mid right clavicle. Visualized soft tissues are unremarkable. Right shoulder: No additional fracture or dislocation. Mild-to-moderate degenerative changes of the acromioclavicular joint. Irregularity of the greater tuberosity indicative of rotator cuff tendinopathy. IMPRESSION: Mildly displaced oblique fracture of the mid right clavicle. Electronically Signed   By: Elester Grim M.D.   On: 01/08/2024 10:34   CT Head Wo Contrast Result Date: 01/08/2024 CLINICAL DATA:  Head trauma, minor (Age >= 65y); Neck trauma (Age >= 65y).  Multiple recent falls. Headache and right shoulder pain. EXAM: CT HEAD WITHOUT CONTRAST CT CERVICAL SPINE WITHOUT CONTRAST TECHNIQUE: Multidetector CT imaging of the head and cervical spine was performed following the standard protocol without intravenous contrast.  Multiplanar CT image reconstructions of the cervical spine were also generated. RADIATION DOSE REDUCTION: This exam was performed according to the departmental dose-optimization program which includes automated exposure control, adjustment of the mA and/or kV according to patient size and/or use of iterative reconstruction technique. COMPARISON:  Head CT and MRI 11/28/2023 FINDINGS: CT HEAD FINDINGS Brain: There is no evidence of an acute infarct, intracranial hemorrhage, mass, midline shift, or extra-axial fluid collection. Cerebral white matter hypodensities are unchanged and nonspecific but compatible with mild chronic small vessel ischemic disease. Mild-to-moderate ventriculomegaly is unchanged and attributed to central predominant cerebral atrophy. Vascular: Calcified atherosclerosis at the skull base. No hyperdense vessel. Skull: No acute fracture or suspicious lesion. Sinuses/Orbits: Visualized paranasal sinuses and mastoid air cells are clear para bilateral cataract extraction. Other: Mild left parietal scalp swelling/contusion. CT CERVICAL SPINE FINDINGS Alignment: Cervical spine straightening.  No significant listhesis. Skull base and vertebrae: No acute fracture or suspicious lesion. Soft tissues and spinal canal: No prevertebral fluid or swelling. No visible canal hematoma. Disc levels: Cervical spondylosis, most notable at C5-6 where a large left paracentral disc osteophyte complex results in at least moderate spinal stenosis. Mild spinal stenosis at C4-5 and C6-7. Up to moderate multilevel neural foraminal stenosis. Upper chest: No mass or consolidation in the included lung apices. Other: Moderate atherosclerotic calcifications about the carotid bifurcations. IMPRESSION: 1. No evidence of acute intracranial abnormality or cervical spine fracture. 2. Cervical spondylosis including at least moderate spinal stenosis at C5-6. Electronically Signed   By: Aundra Lee M.D.   On: 01/08/2024 09:51   CT  Cervical Spine Wo Contrast Result Date: 01/08/2024 CLINICAL DATA:  Head trauma, minor (Age >= 65y); Neck trauma (Age >= 65y). Multiple recent falls. Headache and right shoulder pain. EXAM: CT HEAD WITHOUT CONTRAST CT CERVICAL SPINE WITHOUT CONTRAST TECHNIQUE: Multidetector CT imaging of the head and cervical spine was performed following the standard protocol without intravenous contrast. Multiplanar CT image reconstructions of the cervical spine were also generated. RADIATION DOSE REDUCTION: This exam was performed according to the departmental dose-optimization program which includes automated exposure control, adjustment of the mA and/or kV according to patient size and/or use of iterative reconstruction technique. COMPARISON:  Head CT and MRI 11/28/2023 FINDINGS: CT HEAD FINDINGS Brain: There is no evidence of an acute infarct, intracranial hemorrhage, mass, midline shift, or extra-axial fluid collection. Cerebral white matter hypodensities are unchanged and nonspecific but compatible with mild chronic small vessel ischemic disease. Mild-to-moderate ventriculomegaly is unchanged and attributed to central predominant cerebral atrophy. Vascular: Calcified atherosclerosis at the skull base. No hyperdense vessel. Skull: No acute fracture or suspicious lesion. Sinuses/Orbits: Visualized paranasal sinuses and mastoid air cells are clear para bilateral cataract extraction. Other: Mild left parietal scalp swelling/contusion. CT CERVICAL SPINE FINDINGS Alignment: Cervical spine straightening.  No significant listhesis. Skull base and vertebrae: No acute fracture or suspicious lesion. Soft tissues and spinal canal: No prevertebral fluid or swelling. No visible canal hematoma. Disc levels: Cervical spondylosis, most notable at C5-6 where a large left paracentral disc osteophyte complex results in at least moderate spinal stenosis. Mild spinal stenosis at C4-5 and C6-7. Up to moderate multilevel neural foraminal stenosis.  Upper chest: No mass or consolidation in the included lung apices. Other: Moderate atherosclerotic calcifications about the carotid bifurcations.  IMPRESSION: 1. No evidence of acute intracranial abnormality or cervical spine fracture. 2. Cervical spondylosis including at least moderate spinal stenosis at C5-6. Electronically Signed   By: Aundra Lee M.D.   On: 01/08/2024 09:51    Microbiology: Results for orders placed or performed during the hospital encounter of 11/28/23  Culture, blood (Routine X 2) w Reflex to ID Panel     Status: None   Collection Time: 11/28/23  3:02 PM   Specimen: BLOOD  Result Value Ref Range Status   Specimen Description BLOOD BLOOD RIGHT HAND  Final   Special Requests   Final    BOTTLES DRAWN AEROBIC ONLY Blood Culture results may not be optimal due to an inadequate volume of blood received in culture bottles   Culture   Final    NO GROWTH 5 DAYS Performed at [facility], [address] [place], [place] [address]    Report Status 12/03/2023 FINAL  Final  Culture, blood (Routine X 2) w Reflex to ID Panel     Status: None   Collection Time: 11/28/23  3:03 PM   Specimen: BLOOD  Result Value Ref Range Status   Specimen Description BLOOD BLOOD LEFT HAND  Final   Special Requests   Final    BOTTLES DRAWN AEROBIC AND ANAEROBIC Blood Culture results may not be optimal due to an inadequate volume of blood received in culture bottles   Culture   Final    NO GROWTH 5 DAYS Performed at [facility], [address], [place], [place] [address]    Report Status 12/03/2023 FINAL  Final  Resp panel by RT-PCR (RSV, Flu A&B, Covid) Anterior Nasal Swab     Status: None   Collection Time: 11/28/23  3:05 PM   Specimen: Anterior Nasal Swab  Result Value Ref Range Status   SARS Coronavirus 2 by RT PCR NEGATIVE NEGATIVE Final    Comment: (NOTE) SARS-CoV-2 target nucleic acids are NOT DETECTED.  The SARS-CoV-2 RNA is generally detectable in upper respiratory specimens during  the acute phase of infection. The lowest concentration of SARS-CoV-2 viral copies this assay can detect is 138 copies/mL. A negative result does not preclude SARS-Cov-2 infection and should not be used as the sole basis for treatment or other patient management decisions. A negative result may occur with  improper specimen collection/handling, submission of specimen other than nasopharyngeal swab, presence of viral mutation(s) within the areas targeted by this assay, and inadequate number of viral copies(<138 copies/mL). A negative result must be combined with clinical observations, patient history, and epidemiological information. The expected result is Negative.  Fact Sheet for Patients:  BloggerCourse.com  Fact Sheet for Healthcare Providers:  SeriousBroker.it  This test is no t yet approved or cleared by the [place]  FDA and  has been authorized for detection and/or diagnosis of SARS-CoV-2 by FDA under an Emergency Use Authorization (EUA). This EUA will remain  in effect (meaning this test can be used) for the duration of the COVID-19 declaration under Section 564(b)(1) of the Act, 21 U.S.C.section 360bbb-3(b)(1), unless the authorization is terminated  or revoked sooner.       Influenza A by PCR NEGATIVE NEGATIVE Final   Influenza B by PCR NEGATIVE NEGATIVE Final    Comment: (NOTE) The Xpert Xpress SARS-CoV-2/FLU/RSV plus assay is intended as an aid in the diagnosis of influenza from Nasopharyngeal swab specimens and should not be used as a sole basis for treatment. Nasal washings and aspirates are unacceptable for Xpert Xpress SARS-CoV-2/FLU/RSV  testing.  Fact Sheet for Patients: BloggerCourse.com  Fact Sheet for Healthcare Providers: SeriousBroker.it  This test is not yet approved or cleared by the [place]  FDA and has been authorized for detection and/or  diagnosis of SARS-CoV-2 by FDA under an Emergency Use Authorization (EUA). This EUA will remain in effect (meaning this test can be used) for the duration of the COVID-19 declaration under Section 564(b)(1) of the Act, 21 U.S.C. section 360bbb-3(b)(1), unless the authorization is terminated or revoked.     Resp Syncytial Virus by PCR NEGATIVE NEGATIVE Final    Comment: (NOTE) Fact Sheet for Patients: BloggerCourse.com  Fact Sheet for Healthcare Providers: SeriousBroker.it  This test is not yet approved or cleared by the [place]  FDA and has been authorized for detection and/or diagnosis of SARS-CoV-2 by FDA under an Emergency Use Authorization (EUA). This EUA will remain in effect (meaning this test can be used) for the duration of the COVID-19 declaration under Section 564(b)(1) of the Act, 21 U.S.C. section 360bbb-3(b)(1), unless the authorization is terminated or revoked.  Performed at [facility], [address] [place], [place] [address]   MRSA Next Gen by PCR, Nasal     Status: None   Collection Time: 11/29/23  1:30 AM   Specimen: Nasal Mucosa; Nasal Swab  Result Value Ref Range Status   MRSA by PCR Next Gen NOT DETECTED NOT DETECTED Final    Comment: (NOTE) The GeneXpert MRSA Assay (FDA approved for NASAL specimens only), is one component of a comprehensive MRSA colonization surveillance program. It is not intended to diagnose MRSA infection nor to guide or monitor treatment for MRSA infections. Test performance is not FDA approved in patients less than 63 years old. Performed at [facility], [address] [place], [place] [address]     Labs: CBC: Recent Labs  Lab 01/13/24 2122 01/14/24 0508  WBC 8.6 8.0  NEUTROABS 5.4  --   HGB 12.3 11.5*  HCT 35.9* 35.1*  MCV 89.1 91.9  PLT 191 159   Basic Metabolic Panel: Recent Labs  Lab 01/13/24 2122 01/14/24 0508  NA 132* 135  K 3.6 4.3  CL 95* 96*  CO2  26 27  GLUCOSE 239* 332*  BUN 19 17  CREATININE 1.18* 1.13*  CALCIUM  9.4 9.2  MG  --  1.9  PHOS  --  3.1   Liver Function Tests: Recent Labs  Lab 01/13/24 2122 01/14/24 0508  AST 16 15  ALT 13 11  ALKPHOS 61 60  BILITOT 0.7 1.0  PROT 7.4 6.9  ALBUMIN  3.7 3.3*   CBG: Recent Labs  Lab 01/15/24 0716 01/15/24 1125 01/15/24 1627 01/15/24 2034 01/16/24 0734  GLUCAP 303* 426* 259* 206* 259*    Discharge time spent: greater than 30 minutes.  Signed: Justina Oman, MD [facility] 01/16/2024

## 2024-01-16 NOTE — Inpatient Diabetes Management (Signed)
 Inpatient Diabetes Program Recommendations  AACE/ADA: New Consensus Statement on Inpatient Glycemic Control (2015)  Target Ranges:  Prepandial:   less than 140 mg/dL      Peak postprandial:   less than 180 mg/dL (1-2 hours)      Critically ill patients:  140 - 180 mg/dL   Lab Results  Component Value Date   GLUCAP 259 (H) 01/16/2024   HGBA1C 9.1 (H) 11/28/2023    Latest Reference Range & Units 01/15/24 07:16 01/15/24 11:25 01/15/24 16:27 01/15/24 20:34 01/16/24 07:34  Glucose-Capillary 70 - 99 mg/dL 914 (H) 782 (H) 956 (H) 206 (H) 259 (H)  (H): Data is abnormally high  Diabetes history: DM2 Outpatient Diabetes medications: Tresiba  60 units if eating (takes 30 units if not eating), Novolog  25-36 units TID with meals, Ozempic  2 mg Qweek Current orders for Inpatient glycemic control: Semglee  20 units daily, Novolog  0-15 units TID with meals, Novolog  0-5 units QHS  Inpatient Diabetes Program Recommendations:   Please consider: -Increase Semglee  to 25 units daily -Add Novolog  5 units tid meal coverage if eats 50%  Thank you, Marily Shows E. Stephaun Million, RN, MSN, CDCES  Diabetes Coordinator Inpatient Glycemic Control Team Team Pager [phone] (8am-5pm) 01/16/2024 8:41 AM

## 2024-01-16 NOTE — Plan of Care (Signed)

## 2024-01-16 NOTE — Progress Notes (Deleted)
 Cardiology Office Note:   Date:  01/16/2024  ID:  [name], DOB 1951/08/10, MRN [id number] PCP: Albertha Huger, FNP  [facility] Providers Cardiologist:  Eilleen Grates, MD {  History of Present Illness:   [name] is a 73 y.o. female who presents for follow up of CAD.  I have not seen her since 2018.   She has a known history of stents to the LAD and PTCA to diagonal in 2008.    She had non obstructive CAD in 2015.  She was admitted with slurred speech in 2016 and had a negative work up with an unremarkable echo and mild carotid plaque.  POET (Plain Old Exercise Treadmill) was intermediate risk.   I last saw her in 2018.  She had atypical chest pain at that time.  She had a follow-up perfusion study that suggest some possible mild ischemia in the basal inferolateral, mid inferolateral, apical inferior and apical lateral locations.   She had a cath at that time and had patent stents as below.    She was in the hospital in July 2023.  She had unstable angina with restenosis of the LAD proximal to the previously placed stent.  She had stenting of this.  She was in the hospital again in Jan 2024 .  She had unstable angina again and was in the hosital.  She had PCI of an LAD in stent restenosis.     She presents for follow up.  Since I last saw her she was in the hospital with a possible seizure and frequent falls.  ***   *** .  I reviewed these records for this visit.  .  She had HTN he is having some dizziness when she first stands up but she otherwise is doing well.  She is not getting any angina she was getting.  She denies any chest pressure, neck or arm discomfort.  She has had no new shortness of breath, PND or orthopnea.  She walks with her neighbor.    Essential hypertension - Continue current antihypertensive agents - Follow vital signs and further adjust medication as needed - Heart healthy/low-sodium diet discussed with patient. -Currently receiving lisinopril ,  metoprolol  and Lasix . - Reassesses Blood pressure at follow-up visit with further adjustment to antihypertensive agents as required.   Seizure-like activity (HCC) - EEG negative - Following neurology recommendations no antiepileptic agents will be started - Continue supportive care and maintain adequate hydration. -Outpatient follow-up with neurology service recommended.   Mixed hyperlipidemia - Continue treatment with Crestor  -Heart healthy diet discussed with patient.   Type 2 diabetes mellitus with hyperglycemia (HCC) - A1c in the 9 range demonstrating poor control - Hyperglycemia appreciated at time of admission; most likely contributing to patient's dehydration. - Continue sliding scale insulin  and adjusted dose of insulin  - Modified Kawatu diet discussed with patient. - Continue follow-up patient's CBGs fluctuation with further adjustment on hypoglycemic regimen as required.   Recurrent falls - Evaluated by physical therapy with recommendations for a skilled nursing facility placement. - Patient will be discharged to [facility] skilled nursing home for further care and rehabilitation.   Clavicular fracture - Continue using sling - As needed analgesics - Outpatient follow-up with orthopedic service as recommended. - Continue rehabilitation.   B12 deficiency - B12 level 138 - Aggressive supplementation and anticipated monthly injection for maintenance will be pursuit. - Continue supportive care.  ROS: ***  Studies Reviewed:    EKG:       ***  Risk Assessment/Calculations:   {Does this patient have ATRIAL FIBRILLATION?:[phone]}          Physical Exam:   VS:  There were no vitals taken for this visit.   Wt Readings from Last 3 Encounters:  01/13/24 165 lb 5.5 oz (75 kg)  01/08/24 165 lb (74.8 kg)  11/28/23 155 lb 11.2 oz (70.6 kg)     GEN: Well nourished, well developed in no acute distress NECK: No JVD; No carotid bruits CARDIAC: ***RR, *** murmurs, rubs,  gallops RESPIRATORY:  Clear to auscultation without rales, wheezing or rhonchi  ABDOMEN: Soft, non-tender, non-distended EXTREMITIES:  No edema; No deformity   ASSESSMENT AND PLAN:   CAD:   ***  The patient has no new sypmtoms.  No further cardiovascular testing is indicated.  We will continue with aggressive risk reduction and meds as listed.   HTN:  Her blood pressure is *** at target.  No change in therapy.    CAROTID STENOSIS:    This has ***  been mild.  No further imaging.   HYPERLIPIDEMIA:     Her LDL was ***  at 65.  No change in therapy.    DM:  A1C was ***  ot controlled and she just had her Ozempic  increased.  I will defer to her primary provider.    PROLONGED QT:     QT is ***   mildly prolonged and she avoids QT prolonging medications.        Follow up ***  Signed, Eilleen Grates, MD

## 2024-01-16 NOTE — TOC Transition Note (Signed)
 Transition of Care [facility]) - Discharge Note   Patient Details  Name: [name] MRN: [id number] Date of Birth: [birth year]  [facility] CM/SW Contact:  Orelia Binet, RN Phone Number: 01/16/2024, 10:17 AM   Clinical Narrative:   Peer and Peer completed and approved to return to [facility]. EMS not approved. CM spoke with her daughter she is agreeable to transport. DC summary sent in the hub. Margretta Shi provided a room number. RN calling report. TOC will update daughter with a time to transport.     Final next level of care: Skilled Nursing Facility Barriers to Discharge: Barriers Resolved   Patient Goals and CMS Choice Patient states their goals for this hospitalization and ongoing recovery are:: DC to SNF for short-term rehab CMS Medicare.gov Compare Post Acute Care list provided to:: Patient Choice offered to / list presented to : Patient     Discharge Placement          Name of family member notified: Daughter Patient and family notified of of transfer: 01/16/24  Discharge Plan and Services Additional resources added to the After Visit Summary for   In-house Referral: Clinical Social Work Discharge Planning Services: CM Consult Post Acute Care Choice: Durable Medical Equipment                 Social Drivers of Health (SDOH) Interventions SDOH Screenings   Food Insecurity: No Food Insecurity (01/14/2024)  Housing: Low Risk  (01/14/2024)  Transportation Needs: No Transportation Needs (01/14/2024)  Utilities: Not At Risk (01/14/2024)  Alcohol Screen: Low Risk  (01/24/2023)  Depression (PHQ2-9): Low Risk  (10/09/2023)  Recent Concern: Depression (PHQ2-9) - Medium Risk (08/27/2023)  Financial Resource Strain: Low Risk  (01/24/2023)  Physical Activity: Insufficiently Active (01/24/2023)  Social Connections: Moderately Integrated (01/14/2024)  Recent Concern: Social Connections - Moderately Isolated (11/29/2023)  Stress: No Stress Concern Present (01/24/2023)  Tobacco  Use: Low Risk  (01/13/2024)

## 2024-01-16 NOTE — Progress Notes (Signed)
 Physical Therapy Treatment Patient Details Name: [name] MRN: [id number] DOB: 1951/08/21 Today's Date: 01/16/2024   History of Present Illness Pt Karen Goody. [name] is a 73 y/o female with history of frequent falls on Plavix . Last fall last week hit left side of head, bruised with HA since. Fall this morning resulted in rt shoulder pain with "pop." Pt denies LOC.    PT Comments  Pt tolerated today's treatment session, well with great return for mobility. Today's session addressed progressing functional activity tolerance and reducing assist with mobility. Pt noted with continued unsteadiness during transfers and ambulation, but no need for physical assist at this level. Pt continues to show more unsteadiness when turning during ambulation. Pt would continue to benefit from skilled acute physical therapy services in order to progress toward POC goals, safety/independence with functional mobility and QOL.     If plan is discharge home, recommend the following: A little help with walking and/or transfers;A little help with bathing/dressing/bathroom;Assistance with cooking/housework;Help with stairs or ramp for entrance   Can travel by private vehicle     Yes  Equipment Recommendations  None recommended by PT    Recommendations for Other Services       Precautions / Restrictions Precautions Precautions: Fall Recall of Precautions/Restrictions: Intact Required Braces or Orthoses: Sling Restrictions Weight Bearing Restrictions Per Provider Order: Yes RUE Weight Bearing Per Provider Order: Non weight bearing     Mobility  Bed Mobility Overal bed mobility: Needs Assistance Bed Mobility: Supine to Sit     Supine to sit: Min assist     General bed mobility comments: increased time, labored movement, with pt pullig to sit with LUE. Patient Response: Cooperative  Transfers Overall transfer level: Modified independent Equipment used: None   Sit to Stand: Supervision            General transfer comment: 15x total sit/stands from recliner at supervision level    Ambulation/Gait Ambulation/Gait assistance: Contact guard assist Gait Distance (Feet): 40 Feet Assistive device: None, 1 person hand held assist Gait Pattern/deviations: Decreased step length - right, Decreased step length - left, Decreased stride length       General Gait Details: slow, unsteady movement with no AD, abnormal UB posterior lean during ambulation with mild LOB laterally when turning.   Stairs             Wheelchair Mobility     Tilt Bed Tilt Bed Patient Response: Cooperative  Modified Rankin (Stroke Patients Only)       Balance Overall balance assessment: Needs assistance   Sitting balance-Leahy Scale: Good Sitting balance - Comments: fgood/good seated at EOB   Standing balance support: Single extremity supported, During functional activity Standing balance-Leahy Scale: Good Standing balance comment: no AD                            Communication Communication Communication: No apparent difficulties  Cognition Arousal: Alert Behavior During Therapy: WFL for tasks assessed/performed   PT - Cognitive impairments: No apparent impairments                         Following commands: Intact      Cueing Cueing Techniques: Verbal cues, Tactile cues  Exercises General Exercises - Lower Extremity Long Arc Quad: AROM, Strengthening, Both, 10 reps, Seated Toe Raises: AAROM, Both, Seated, 20 reps Other Exercises Other Exercises: 5x3 sit/stands with 1 min rest breaks-marching in place  on last set    General Comments        Pertinent Vitals/Pain Pain Assessment Pain Assessment: 0-10 Pain Score: 8  Pain Location: R shoulder Pain Descriptors / Indicators: Guarding, Grimacing    Home Living                          Prior Function            PT Goals (current goals can now be found in the care plan section) Acute Rehab PT  Goals Patient Stated Goal: Return home with family to assist PT Goal Formulation: With patient Time For Goal Achievement: 01/28/24 Potential to Achieve Goals: Good    Frequency    Min 3X/week      PT Plan      Co-evaluation              AM-PAC PT "6 Clicks" Mobility   Outcome Measure  Help needed turning from your back to your side while in a flat bed without using bedrails?: A Little Help needed moving from lying on your back to sitting on the side of a flat bed without using bedrails?: None Help needed moving to and from a bed to a chair (including a wheelchair)?: None Help needed standing up from a chair using your arms (e.g., wheelchair or bedside chair)?: None Help needed to walk in hospital room?: A Little Help needed climbing 3-5 steps with a railing? : A Lot 6 Click Score: 20    End of Session   Activity Tolerance: Patient tolerated treatment well;Patient limited by fatigue Patient left: in bed;with call bell/phone within reach Nurse Communication: Mobility status PT Visit Diagnosis: Unsteadiness on feet (R26.81);Other abnormalities of gait and mobility (R26.89);Muscle weakness (generalized) (M62.81);History of falling (Z91.81)     Time: 1610-9604 PT Time Calculation (min) (ACUTE ONLY): 16 min  Charges:    $Therapeutic Activity: 8-22 mins PT General Charges $$ ACUTE PT VISIT: 1 Visit                     Astrid Lay, DPT [facility] [place] [address] [phone] office   Gatha Kaska 01/16/2024, 8:01 AM

## 2024-01-17 ENCOUNTER — Ambulatory Visit: Admitting: Cardiology

## 2024-01-17 DIAGNOSIS — E118 Type 2 diabetes mellitus with unspecified complications: Secondary | ICD-10-CM

## 2024-01-17 DIAGNOSIS — R9431 Abnormal electrocardiogram [ECG] [EKG]: Secondary | ICD-10-CM

## 2024-01-17 DIAGNOSIS — E785 Hyperlipidemia, unspecified: Secondary | ICD-10-CM

## 2024-01-17 DIAGNOSIS — I2510 Atherosclerotic heart disease of native coronary artery without angina pectoris: Secondary | ICD-10-CM

## 2024-01-17 DIAGNOSIS — I10 Essential (primary) hypertension: Secondary | ICD-10-CM

## 2024-01-20 DIAGNOSIS — R5381 Other malaise: Secondary | ICD-10-CM | POA: Diagnosis not present

## 2024-01-20 DIAGNOSIS — R569 Unspecified convulsions: Secondary | ICD-10-CM | POA: Diagnosis not present

## 2024-01-22 DIAGNOSIS — E785 Hyperlipidemia, unspecified: Secondary | ICD-10-CM | POA: Diagnosis not present

## 2024-01-22 DIAGNOSIS — W19XXXD Unspecified fall, subsequent encounter: Secondary | ICD-10-CM | POA: Diagnosis not present

## 2024-01-22 DIAGNOSIS — I509 Heart failure, unspecified: Secondary | ICD-10-CM | POA: Diagnosis not present

## 2024-01-22 DIAGNOSIS — M6281 Muscle weakness (generalized): Secondary | ICD-10-CM | POA: Diagnosis not present

## 2024-01-22 DIAGNOSIS — R5381 Other malaise: Secondary | ICD-10-CM | POA: Diagnosis not present

## 2024-01-22 DIAGNOSIS — I10 Essential (primary) hypertension: Secondary | ICD-10-CM | POA: Diagnosis not present

## 2024-01-22 DIAGNOSIS — E119 Type 2 diabetes mellitus without complications: Secondary | ICD-10-CM | POA: Diagnosis not present

## 2024-01-22 DIAGNOSIS — R262 Difficulty in walking, not elsewhere classified: Secondary | ICD-10-CM | POA: Diagnosis not present

## 2024-01-22 DIAGNOSIS — S42021D Displaced fracture of shaft of right clavicle, subsequent encounter for fracture with routine healing: Secondary | ICD-10-CM | POA: Diagnosis not present

## 2024-01-22 DIAGNOSIS — D518 Other vitamin B12 deficiency anemias: Secondary | ICD-10-CM | POA: Diagnosis not present

## 2024-01-22 DIAGNOSIS — I2510 Atherosclerotic heart disease of native coronary artery without angina pectoris: Secondary | ICD-10-CM | POA: Diagnosis not present

## 2024-01-23 DIAGNOSIS — D649 Anemia, unspecified: Secondary | ICD-10-CM | POA: Diagnosis not present

## 2024-01-23 DIAGNOSIS — I509 Heart failure, unspecified: Secondary | ICD-10-CM | POA: Diagnosis not present

## 2024-01-23 DIAGNOSIS — E1165 Type 2 diabetes mellitus with hyperglycemia: Secondary | ICD-10-CM | POA: Diagnosis not present

## 2024-01-23 DIAGNOSIS — E559 Vitamin D deficiency, unspecified: Secondary | ICD-10-CM | POA: Diagnosis not present

## 2024-01-24 DIAGNOSIS — I2510 Atherosclerotic heart disease of native coronary artery without angina pectoris: Secondary | ICD-10-CM | POA: Diagnosis not present

## 2024-01-24 DIAGNOSIS — E785 Hyperlipidemia, unspecified: Secondary | ICD-10-CM | POA: Diagnosis not present

## 2024-01-24 DIAGNOSIS — I509 Heart failure, unspecified: Secondary | ICD-10-CM | POA: Diagnosis not present

## 2024-01-24 DIAGNOSIS — D649 Anemia, unspecified: Secondary | ICD-10-CM | POA: Diagnosis not present

## 2024-01-24 DIAGNOSIS — D518 Other vitamin B12 deficiency anemias: Secondary | ICD-10-CM | POA: Diagnosis not present

## 2024-01-24 DIAGNOSIS — F339 Major depressive disorder, recurrent, unspecified: Secondary | ICD-10-CM | POA: Diagnosis not present

## 2024-01-24 DIAGNOSIS — S42021D Displaced fracture of shaft of right clavicle, subsequent encounter for fracture with routine healing: Secondary | ICD-10-CM | POA: Diagnosis not present

## 2024-01-24 DIAGNOSIS — E559 Vitamin D deficiency, unspecified: Secondary | ICD-10-CM | POA: Diagnosis not present

## 2024-01-24 DIAGNOSIS — M6281 Muscle weakness (generalized): Secondary | ICD-10-CM | POA: Diagnosis not present

## 2024-01-24 DIAGNOSIS — E1165 Type 2 diabetes mellitus with hyperglycemia: Secondary | ICD-10-CM | POA: Diagnosis not present

## 2024-01-24 DIAGNOSIS — I10 Essential (primary) hypertension: Secondary | ICD-10-CM | POA: Diagnosis not present

## 2024-01-24 DIAGNOSIS — R262 Difficulty in walking, not elsewhere classified: Secondary | ICD-10-CM | POA: Diagnosis not present

## 2024-01-31 ENCOUNTER — Telehealth: Payer: Self-pay

## 2024-01-31 ENCOUNTER — Ambulatory Visit (INDEPENDENT_AMBULATORY_CARE_PROVIDER_SITE_OTHER): Admitting: Family Medicine

## 2024-01-31 VITALS — BP 118/61 | HR 88 | Temp 98.9°F | Ht 66.0 in | Wt 161.4 lb

## 2024-01-31 DIAGNOSIS — R569 Unspecified convulsions: Secondary | ICD-10-CM

## 2024-01-31 DIAGNOSIS — R296 Repeated falls: Secondary | ICD-10-CM

## 2024-01-31 DIAGNOSIS — I509 Heart failure, unspecified: Secondary | ICD-10-CM

## 2024-01-31 DIAGNOSIS — D518 Other vitamin B12 deficiency anemias: Secondary | ICD-10-CM | POA: Diagnosis not present

## 2024-01-31 DIAGNOSIS — I152 Hypertension secondary to endocrine disorders: Secondary | ICD-10-CM

## 2024-01-31 DIAGNOSIS — S42001D Fracture of unspecified part of right clavicle, subsequent encounter for fracture with routine healing: Secondary | ICD-10-CM | POA: Diagnosis not present

## 2024-01-31 DIAGNOSIS — E1159 Type 2 diabetes mellitus with other circulatory complications: Secondary | ICD-10-CM | POA: Diagnosis not present

## 2024-01-31 DIAGNOSIS — Z599 Problem related to housing and economic circumstances, unspecified: Secondary | ICD-10-CM

## 2024-01-31 DIAGNOSIS — E1169 Type 2 diabetes mellitus with other specified complication: Secondary | ICD-10-CM

## 2024-01-31 DIAGNOSIS — N1832 Chronic kidney disease, stage 3b: Secondary | ICD-10-CM

## 2024-01-31 DIAGNOSIS — Z794 Long term (current) use of insulin: Secondary | ICD-10-CM | POA: Diagnosis not present

## 2024-01-31 DIAGNOSIS — S42021D Displaced fracture of shaft of right clavicle, subsequent encounter for fracture with routine healing: Secondary | ICD-10-CM | POA: Diagnosis not present

## 2024-01-31 DIAGNOSIS — Z09 Encounter for follow-up examination after completed treatment for conditions other than malignant neoplasm: Secondary | ICD-10-CM

## 2024-01-31 DIAGNOSIS — E1165 Type 2 diabetes mellitus with hyperglycemia: Secondary | ICD-10-CM | POA: Diagnosis not present

## 2024-01-31 DIAGNOSIS — E1122 Type 2 diabetes mellitus with diabetic chronic kidney disease: Secondary | ICD-10-CM | POA: Diagnosis not present

## 2024-01-31 DIAGNOSIS — E1136 Type 2 diabetes mellitus with diabetic cataract: Secondary | ICD-10-CM | POA: Diagnosis not present

## 2024-01-31 DIAGNOSIS — I2510 Atherosclerotic heart disease of native coronary artery without angina pectoris: Secondary | ICD-10-CM | POA: Diagnosis not present

## 2024-01-31 DIAGNOSIS — I130 Hypertensive heart and chronic kidney disease with heart failure and stage 1 through stage 4 chronic kidney disease, or unspecified chronic kidney disease: Secondary | ICD-10-CM | POA: Diagnosis not present

## 2024-01-31 DIAGNOSIS — E538 Deficiency of other specified B group vitamins: Secondary | ICD-10-CM

## 2024-01-31 DIAGNOSIS — F339 Major depressive disorder, recurrent, unspecified: Secondary | ICD-10-CM | POA: Diagnosis not present

## 2024-01-31 MED ORDER — INSULIN GLARGINE 100 UNIT/ML ~~LOC~~ SOLN: 20.0000 [IU] | Freq: Two times a day (BID) | SUBCUTANEOUS | Status: DC

## 2024-01-31 NOTE — Telephone Encounter (Signed)
 Verbal okay given.

## 2024-01-31 NOTE — Telephone Encounter (Signed)
 Copied from CRM [id number]. Topic: Clinical - Home Health Verbal Orders >> Jan 31, 2024 11:55 AM Felizardo Hotter wrote: Caller/Agency: [facility] per [employer] Number: [phone] secure line Service Requested: Physical Therapy Frequency: 2w4,  Any new concerns about the patient? No

## 2024-01-31 NOTE — Progress Notes (Unsigned)
 Established Patient Office Visit  Subjective   Patient ID: [name], female    DOB: Jun 16, 1951  Age: 73 y.o. MRN: [id number]  Chief Complaint  Patient presents with   Medical Management of Chronic Issues    HPI [name] is here for a hospital discharge follow up. She was discharged from [facility] on 01/16/24 for seizure like activity. She was discharged to a rehab facility in [place].    Per hospital discharge summary:  "Patient complained of multiple falls as well as questionable seizure activity yesterday. Patient was in the rehab facility for frequent falls, she was visited by her sister yesterday who witnessed her having tonic-clonic seizure activity and some slurred speech afterwards. She was confused for some time after the seizure activity, but was back to her baseline by the time she got to the ED. Apparently, patient was reported to have violent intermittent confusion since she was at nursing facility.   Assessment and Plan: Essential hypertension - Continue current antihypertensive agents - Follow vital signs and further adjust medication as needed - Heart healthy/low-sodium diet discussed with patient. -Currently receiving lisinopril , metoprolol  and Lasix . - Reassesses Blood pressure at follow-up visit with further adjustment to antihypertensive agents as required.   Seizure-like activity (HCC) - EEG negative - Following neurology recommendations no antiepileptic agents will be started - Continue supportive care and maintain adequate hydration. -Outpatient follow-up with neurology service recommended.   Mixed hyperlipidemia - Continue treatment with Crestor  -Heart healthy diet discussed with patient.   Type 2 diabetes mellitus with hyperglycemia (HCC) - A1c in the 9 range demonstrating poor control - Hyperglycemia appreciated at time of admission; most likely contributing to patient's dehydration. - Continue sliding scale insulin  and adjusted dose of insulin  - Modified  [place] diet discussed with patient. - Continue follow-up patient's CBGs fluctuation with further adjustment on hypoglycemic regimen as required.   Recurrent falls - Evaluated by physical therapy with recommendations for a skilled nursing facility placement. - Patient will be discharged to [facility] skilled nursing home for further care and rehabilitation.   Clavicular fracture - Continue using sling - As needed analgesics - Outpatient follow-up with orthopedic service as recommended. - Continue rehabilitation.   B12 deficiency - B12 level 138 - Aggressive supplementation and anticipated monthly injection for maintenance will be pursuit. - Continue supportive care."  She was D/C home with her sister from rehab facility on 01/27/24. Will be living with her sister for at least 2 weeks. PT/OT has been coming out 2x a week. Denies falls since discharge. She is no longer having dizziness. Denies any seizure like activity.   Wearing sling for her clavicle fracture. Pain has been intermittent, worse with activity. Has not gotten a call regarding ortho appointment.   Blood sugars have been elevated. Blood sugar has above target 68% of the time the last week. ? Compliance with meal time insulin . Reports compliance with out medications.   Concerned about referral to specialists due to costs. Having trouble paying medical bills, medications, groceries, and utilities.      Review of Systems  Constitutional:  Negative for chills and fever.  Eyes:  Negative for blurred vision, double vision and photophobia.  Respiratory:  Negative for sputum production, shortness of breath and wheezing.   Cardiovascular:  Negative for chest pain, palpitations and leg swelling.  Gastrointestinal:  Negative for nausea and vomiting.  Genitourinary:  Negative for dysuria.  Musculoskeletal:  Negative for falls.  Neurological:  Positive for headaches. Negative for dizziness, tingling, sensory  change, speech change, focal  weakness, seizures, loss of consciousness and weakness.  Psychiatric/Behavioral:  Negative for suicidal ideas.       Objective:     BP 118/61   Pulse 88   Temp 98.9 F (37.2 C) (Temporal)   Ht 5\' 6"  (1.676 m)   Wt 161 lb 6.4 oz (73.2 kg)   SpO2 98%   BMI 26.05 kg/m    Physical Exam Vitals and nursing note reviewed.  Constitutional:      General: She is not in acute distress.    Appearance: Normal appearance. She is not ill-appearing, toxic-appearing or diaphoretic.  HENT:     Head: Normocephalic and atraumatic.     Mouth/Throat:     Mouth: Mucous membranes are moist.     Pharynx: Oropharynx is clear.  Eyes:     Extraocular Movements: Extraocular movements intact.     Pupils: Pupils are equal, round, and reactive to light.  Neck:     Thyroid : No thyroid  mass, thyromegaly or thyroid  tenderness.  Cardiovascular:     Rate and Rhythm: Normal rate and regular rhythm.     Heart sounds: Normal heart sounds. No murmur heard. Pulmonary:     Effort: Pulmonary effort is normal. No respiratory distress.     Breath sounds: Normal breath sounds.  Abdominal:     General: Bowel sounds are normal. There is no distension.     Palpations: Abdomen is soft.     Tenderness: There is no abdominal tenderness. There is no right CVA tenderness, left CVA tenderness, guarding or rebound.  Musculoskeletal:     Cervical back: Neck supple. No rigidity.     Right lower leg: No edema.     Left lower leg: No edema.     Comments: Right arm in sling  Skin:    General: Skin is warm and dry.  Neurological:     General: No focal deficit present.     Mental Status: She is alert and oriented to person, place, and time.     Cranial Nerves: No cranial nerve deficit.     Sensory: No sensory deficit.     Motor: No weakness.     Coordination: Coordination normal.     Gait: Gait normal.  Psychiatric:        Mood and Affect: Mood normal.        Behavior: Behavior normal.        Thought Content: Thought  content normal.        Judgment: Judgment normal.      No results found for any visits on 01/31/24.    The ASCVD Risk score (Arnett DK, et al., 2019) failed to calculate for the following reasons:   Risk score cannot be calculated because patient has a medical history suggesting prior/existing ASCVD    Assessment & Plan:   Phara was seen today for medical management of chronic issues.  Diagnoses and all orders for this visit:  Seizure-like activity [facility]) Referral to neurology for outpatient follow up. No seizure like activity since discharge.  -     CBC with Differential/Platelet -     BMP8+EGFR -     Ambulatory referral to Neurology  Type 2 diabetes mellitus with hyperglycemia, with long-term current use of insulin  (HCC) Uncontrolled. Increase Lantus  to 20 mg BID. She has declined referral to endo. Referral to [facility] for management discussed and placed.  -     CBC with Differential/Platelet -     BMP8+EGFR -  AMB Referral [facility] Care Management -     insulin  glargine (LANTUS ) 100 UNIT/ML injection; Inject 0.2 mLs (20 Units total) into the skin 2 (two) times daily.  Hypertension associated with diabetes (HCC) BP at goal.   Congestive heart failure, unspecified HF chronicity, unspecified heart failure type (HCC) Euvolemic today.   Hyperlipidemia associated with type 2 diabetes mellitus (HCC) Continue statin.   Stage 3b chronic kidney disease (HCC) BMP pending.   Recurrent falls Complete PT/OT.  -     Ambulatory referral to Neurology  Closed nondisplaced fracture of right clavicle with routine healing, unspecified part of clavicle, subsequent encounter Continue sling. Referral to ortho discussed and placed.  -     Ambulatory referral to Orthopedic Surgery  B12 deficiency Complete weekly injection x 3, then monthly.  -     Vitamin B12  Financial difficulties -     Ambulatory referral to Social Work  Hospital discharge follow up  Return in about 4 weeks  (around 02/28/2024) for chronic follow up, schedule B12 injection Monday 5/12, 5/19, 5/26.   The patient indicates understanding of these issues and agrees with the plan.  Albertha Huger, FNP

## 2024-02-01 LAB — CBC WITH DIFFERENTIAL/PLATELET
Basophils Absolute: 0 10*3/uL (ref 0.0–0.2)
Basos: 0 %
EOS (ABSOLUTE): 0.3 10*3/uL (ref 0.0–0.4)
Eos: 4 %
Hematocrit: 34.2 % (ref 34.0–46.6)
Hemoglobin: 10.9 g/dL — ABNORMAL LOW (ref 11.1–15.9)
Immature Grans (Abs): 0 10*3/uL (ref 0.0–0.1)
Immature Granulocytes: 0 %
Lymphocytes Absolute: 2.2 10*3/uL (ref 0.7–3.1)
Lymphs: 27 %
MCH: 29.8 pg (ref 26.6–33.0)
MCHC: 31.9 g/dL (ref 31.5–35.7)
MCV: 93 fL (ref 79–97)
Monocytes Absolute: 0.4 10*3/uL (ref 0.1–0.9)
Monocytes: 5 %
Neutrophils Absolute: 5.1 10*3/uL (ref 1.4–7.0)
Neutrophils: 64 %
Platelets: 176 10*3/uL (ref 150–450)
RBC: 3.66 x10E6/uL — ABNORMAL LOW (ref 3.77–5.28)
RDW: 13.2 % (ref 11.7–15.4)
WBC: 8.1 10*3/uL (ref 3.4–10.8)

## 2024-02-01 LAB — BMP8+EGFR
BUN/Creatinine Ratio: 11 — ABNORMAL LOW (ref 12–28)
BUN: 18 mg/dL (ref 8–27)
CO2: 24 mmol/L (ref 20–29)
Calcium: 9.8 mg/dL (ref 8.7–10.3)
Chloride: 102 mmol/L (ref 96–106)
Creatinine, Ser: 1.61 mg/dL — ABNORMAL HIGH (ref 0.57–1.00)
Glucose: 79 mg/dL (ref 70–99)
Potassium: 4.4 mmol/L (ref 3.5–5.2)
Sodium: 143 mmol/L (ref 134–144)
eGFR: 33 mL/min/{1.73_m2} — ABNORMAL LOW (ref >59)

## 2024-02-01 LAB — VITAMIN B12: Vitamin B-12: 816 pg/mL (ref 232–1245)

## 2024-02-03 ENCOUNTER — Encounter: Payer: Self-pay | Admitting: Family Medicine

## 2024-02-03 DIAGNOSIS — I342 Nonrheumatic mitral (valve) stenosis: Secondary | ICD-10-CM | POA: Insufficient documentation

## 2024-02-03 NOTE — Progress Notes (Deleted)
 Cardiology Office Note:   Date:  02/03/2024  ID:  BRAEDYN WEISENBACH, DOB 1950/12/05, MRN [id number] PCP: Albertha Huger, FNP  [facility] Providers Cardiologist:  Eilleen Grates, MD {  History of Present Illness:   TAHEERAH NARASIMHAN is a 73 y.o. female who presents for follow up of CAD.  I have not seen her since 2018.   She has a known history of stents to the LAD and PTCA to diagonal in 2008.    She had non obstructive CAD in 2015.  She was admitted with slurred speech in 2016 and had a negative work up with an unremarkable echo and mild carotid plaque.  POET (Plain Old Exercise Treadmill) was intermediate risk.   I last saw her in 2018.  She had atypical chest pain at that time.  She had a follow-up perfusion study that suggest some possible mild ischemia in the basal inferolateral, mid inferolateral, apical inferior and apical lateral locations.   She had a cath at that time and had patent stents as below.    She was in the hospital in July 2023.  She had unstable angina with restenosis of the LAD proximal to the previously placed stent.  She had stenting of this.  She was in the hospital again in Jan 2024 .  She had unstable angina again and was in the hosital.  She had PCI of an LAD in stent restenosis.    She was readmitted to the hospital in 02/12/2023 due to recurrent unstable angina, with new anterior infarct. Repeat cardiac catheterization revealed significant LAD disease and she was planned for CABG. Patient had CABG x 1 utilizing LIMA to LAD on 02/19/2023.  She presents for follow up.  ***   ***  Seen in the emergency room on 05/09/2023 with hypokalemia, potassium of 3.3, fatigue, and palpitations.  She was given potassium replacement.  Kidney function had deteriorated and her Lasix  was stopped for 2 days and she was to follow-up with PCP.  On follow-up with PCP on 05/13/2023 they did not restart her Lasix .    The patient does not have symptoms concerning for COVID-19 infection (fever,  chills, cough, or new shortness of breath).    She continues to have some dizziness but is some better.  She questions about whether or not she needs to continue amiodarone .  I did go back and look at past medical records and in July she was seen by my RN brought in very PA and had amiodarone  discontinued through [facility].   She presents for follow up.  She is having some dizziness when she first stands up but she otherwise is doing well.  She is not getting any angina she was getting.  She denies any chest pressure, neck or arm discomfort.  She has had no new shortness of breath, PND or orthopnea.  She walks with her neighbor.  ROS: ***  Studies Reviewed:    EKG:       ***  Risk Assessment/Calculations:   {Does this patient have ATRIAL FIBRILLATION?[phone]} No BP recorded.  {Refresh Note OR Click here to enter BP  :1}***        Physical Exam:   VS:  There were no vitals taken for this visit.   Wt Readings from Last 3 Encounters:  01/31/24 161 lb 6.4 oz (73.2 kg)  01/13/24 165 lb 5.5 oz (75 kg)  01/08/24 165 lb (74.8 kg)     GEN: Well nourished, well developed in no acute  distress NECK: No JVD; No carotid bruits CARDIAC: ***RR, *** murmurs, rubs, gallops RESPIRATORY:  Clear to auscultation without rales, wheezing or rhonchi  ABDOMEN: Soft, non-tender, non-distended EXTREMITIES:  No edema; No deformity   ASSESSMENT AND PLAN:   Coronary artery disease:  ***   Status post CABG x 1 utilizing LIMA to LAD on 02/19/2023.  She is currently without any chest discomfort.  She continues to have some lightheadedness and dizziness.  Did have follow-up with [facility] in July 2024 and amiodarone  was discontinued.  Unfortunately the patient was still taking it which may be adding to her dizziness.  This has been discontinued today and she is given instructions to stop it.   Hypokalemia: ***   oted in ED with supplementation due to chest pain and palpitations.  She was given supplementation and  continued on p.o. potassium.  She is due to follow-up with her PCP tomorrow for labs and adjustment of dose.  Lasix  continues to be discontinued.  She denies any swelling after stopping Lasix .   Hypertension:  ***   MS:  This was mild on echo in 2024 and we will follow this clinically.  ***      Follow up ***  Signed, Eilleen Grates, MD

## 2024-02-04 ENCOUNTER — Ambulatory Visit (INDEPENDENT_AMBULATORY_CARE_PROVIDER_SITE_OTHER)

## 2024-02-04 ENCOUNTER — Ambulatory Visit: Payer: Self-pay

## 2024-02-04 DIAGNOSIS — E538 Deficiency of other specified B group vitamins: Secondary | ICD-10-CM

## 2024-02-04 MED ORDER — CYANOCOBALAMIN 1000 MCG/ML IJ SOLN
1000.0000 ug | INTRAMUSCULAR | Status: DC
  Administered 2024-02-04 – 2024-02-25 (×4): 1000 ug via INTRAMUSCULAR

## 2024-02-04 NOTE — Progress Notes (Signed)
 Patient is in office today for a nurse visit for B12 Injection. Patient Injection was given in the  Left deltoid. Patient tolerated injection well.

## 2024-02-05 ENCOUNTER — Telehealth: Payer: Self-pay | Admitting: Family Medicine

## 2024-02-05 ENCOUNTER — Ambulatory Visit: Admitting: Cardiology

## 2024-02-05 DIAGNOSIS — I25110 Atherosclerotic heart disease of native coronary artery with unstable angina pectoris: Secondary | ICD-10-CM

## 2024-02-05 DIAGNOSIS — I342 Nonrheumatic mitral (valve) stenosis: Secondary | ICD-10-CM

## 2024-02-05 DIAGNOSIS — I10 Essential (primary) hypertension: Secondary | ICD-10-CM

## 2024-02-05 NOTE — Telephone Encounter (Signed)
 I have sent Referral to [facility] as requested and also sent a MyChart Message to Patient with Specialty Office contact information.

## 2024-02-10 ENCOUNTER — Other Ambulatory Visit: Payer: Self-pay | Admitting: Family Medicine

## 2024-02-10 ENCOUNTER — Ambulatory Visit: Admitting: Orthopedic Surgery

## 2024-02-10 VITALS — BP 149/83 | HR 103 | Ht 66.0 in | Wt 160.0 lb

## 2024-02-10 DIAGNOSIS — E1169 Type 2 diabetes mellitus with other specified complication: Secondary | ICD-10-CM

## 2024-02-10 DIAGNOSIS — W19XXXA Unspecified fall, initial encounter: Secondary | ICD-10-CM | POA: Diagnosis not present

## 2024-02-10 DIAGNOSIS — S42021A Displaced fracture of shaft of right clavicle, initial encounter for closed fracture: Secondary | ICD-10-CM

## 2024-02-10 NOTE — Progress Notes (Signed)
  Intake history:  BP (!) 149/83   Pulse (!) 103   Ht 5\' 6"  (1.676 m)   Wt 160 lb (72.6 kg)   BMI 25.82 kg/m  Body mass index is 25.82 kg/m.    WHAT ARE WE SEEING YOU FOR TODAY?   right shoulder  How [name] has this bothered you? (DOI?DOS?WS?)  DOI 01/08/24  Anticoag.  Yes  Diabetes Yes  Heart disease Yes  Hypertension Yes  SMOKING HX No  Kidney disease Yes  Any ALLERGIES ______________________________________________   Treatment:  Have you taken:  Tylenol  no  Advil No  Had PT No  Had injection No  Other  _________________________

## 2024-02-10 NOTE — Progress Notes (Signed)
 Chief Complaint  Patient presents with   Shoulder Injury    Right DOI 01/08/24   Shoulder Injury    Right shoulder pain from a fall  73 year old female open-heart surgery last year presents with right shoulder right clavicle fracture secondary to mechanical fall complaining of no pain no numbness or tingling  Physical Exam Vitals and nursing note reviewed.  Constitutional:      Appearance: Normal appearance.  HENT:     Head: Normocephalic and atraumatic.  Eyes:     General: No scleral icterus.       Right eye: No discharge.        Left eye: No discharge.     Extraocular Movements: Extraocular movements intact.     Conjunctiva/sclera: Conjunctivae normal.     Pupils: Pupils are equal, round, and reactive to light.  Cardiovascular:     Rate and Rhythm: Normal rate.     Pulses: Normal pulses.  Musculoskeletal:     Comments: Prominence over the right midshaft clavicle  Skin is without ecchymosis  Neurovascular exam of the right upper extremity is normal  Skin:    General: Skin is warm and dry.     Capillary Refill: Capillary refill takes less than 2 seconds.  Neurological:     General: No focal deficit present.     Mental Status: She is alert and oriented to person, place, and time.  Psychiatric:        Mood and Affect: Mood normal.        Behavior: Behavior normal.        Thought Content: Thought content normal.        Judgment: Judgment normal.    Outside images show a midshaft clavicle fracture with some overlap some dorsal displacement  Encounter Diagnosis  Name Primary?   Closed displaced fracture of shaft of right clavicle, initial encounter Yes    Discussed nonoperative and operative treatment with the patient  Both of us  agree to pursue nonoperative care  X-ray at 6 weeks and 12 weeks  Okay to remove the sling

## 2024-02-11 ENCOUNTER — Ambulatory Visit (INDEPENDENT_AMBULATORY_CARE_PROVIDER_SITE_OTHER)

## 2024-02-11 DIAGNOSIS — E538 Deficiency of other specified B group vitamins: Secondary | ICD-10-CM

## 2024-02-11 NOTE — Progress Notes (Signed)
 02/12/2024 Name: [name] MRN: [id number] DOB: [birth year]  Chief Complaint  Patient presents with   Diabetes    [name] is a 73 y.o. year old female who presented for a telephone visit.   They were referred to the pharmacist by their PCP for assistance in managing diabetes and medication access.    Subjective: Patient reports she is doing okay.   She has her medications at home, but doesn't like taking "so much medicine".  She reports libre readings of 200-300.  She reports one low blood sugar of 69 over night (potential compression low).  She is still enrolled in the novo nordisk patient assistance program.  Care Team: Primary Care Provider: Albertha Huger, FNP    Medication Access/Adherence  Current Pharmacy:  [facility] [address] [place], [place] - [address] [place] [address] Phone: [phone] Fax: [phone]  [facility] - [place], [place] - [address] [place] [address] Phone: [phone] Fax: [phone]   Patient reports affordability concerns with their medications: Yes  Patient reports access/transportation concerns to their pharmacy: No  Patient reports adherence concerns with their medications:  No     Diabetes:  Current medications: Lantus  25-30 units daily; Novolog  0-6 units 3 times daily (often skips doses) Medications tried in the past: stopped Ozempic  due to dull stomach pains. Jardiance , metformin   Current glucose readings:  Avg 201 last 7 days Not able to read out all of the CGM details  Using FSL3 plain CGM--> will call in FSL 3+ for transition  Date of Download: 02/12/24 % Time CGM is active: n/a% Average Glucose: 201 mg/dL Glucose Management Indicator: n/a  Time in Goal:  - Time in range 70-180: 45% - Time above range: 54% - Time below range: 1% (asymptomatic, potential compression lows) Observed patterns: post prandial and AM hyperglycemia  Current meal patterns:   Reports she only eats 1-2 meals daily Encouraged 2-3 healthy plate meals daily to keep blood sugar stable  Current physical activity: limited   Current medication access support: novo nordisk PAP   Objective:  Lab Results  Component Value Date   HGBA1C 9.1 (H) 11/28/2023    Lab Results  Component Value Date   CREATININE 1.61 (H) 01/31/2024   BUN 18 01/31/2024   NA 143 01/31/2024   K 4.4 01/31/2024   CL 102 01/31/2024   CO2 24 01/31/2024    Lab Results  Component Value Date   CHOL 104 01/14/2024   HDL 42 01/14/2024   LDLCALC 49 01/14/2024   LDLDIRECT 93 04/06/2015   TRIG 67 01/14/2024   CHOLHDL 2.5 01/14/2024    Medications Reviewed Today     Reviewed by [name], [facility] (Pharmacist) on 02/12/24 at [phone]  Med List Status: <None>   Medication Order Taking? Sig Documenting Provider Last Dose Status Informant  acetaminophen  (TYLENOL ) 500 MG tablet [id number] No Take 1,000 mg by mouth every 6 (six) hours as needed for mild pain (pain score 1-3) or headache. [provider] Taking Active Nursing Home Medication Administration Guide (MAG)  aspirin  81 MG chewable tablet [id number] No Chew 1 tablet (81 mg total) by mouth daily. Karen Rising, MD Taking Active Nursing Home Medication Administration Guide (MAG)  Blood Glucose Monitoring Suppl DEVI [id number] No 1 each by Does not apply route in the morning, at noon, and at bedtime. May substitute to any manufacturer covered by patient's insurance. Karen Huger, FNP Taking  Active Nursing Home Medication Administration Guide (MAG)  clopidogrel  (PLAVIX ) 75 MG tablet [id number] No Take 1 tablet (75 mg total) by mouth daily with breakfast. Karen Blazing Deannie Fabian, MD Taking Active Nursing Home Medication Administration Guide (MAG)  Continuous Glucose Sensor (FREESTYLE LIBRE 3 PLUS SENSOR) MISC [id number] Yes Change sensor every 15 days. Apply to back of upper arm. DX: E11.65 Karen Huger, FNP  Active   cyanocobalamin   (VITAMIN B12) 1000 MCG/ML injection [id number] No Inject 1 mL daily for 5 more days; then weekly x 1 month and then monthly after that. Karen Oman, MD Taking Active   cyanocobalamin  (VITAMIN B12) injection 1,000 mcg [id number]   Karen Huger, FNP  Active   furosemide  (LASIX ) 20 MG tablet [id number] No Take 1 tablet (20 mg total) by mouth daily. Karen Huger, FNP Taking Active Nursing Home Medication Administration Guide (MAG)  insulin  aspart (NOVOLOG  FLEXPEN) 100 UNIT/ML FlexPen [id number] No Inject 0-6 Units into the skin 3 (three) times daily with meals. Inject per sliding scale 0-200 = 0 units, if less than 70 notify provider 201-250 = 2 units 251-300 = 3 units 301-350 = 4 units 351-400 = 5 units 401-450 = 6 units >450 = notify provider Karen Oman, MD Taking Active   insulin  glargine (LANTUS ) 100 UNIT/ML injection [id number]  Inject 0.2 mLs (20 Units total) into the skin 2 (two) times daily.  Patient taking differently: Inject 25-30 Units into the skin daily.   Karen Huger, FNP  Active   lisinopril  (ZESTRIL ) 10 MG tablet [id number] No Take 1 tablet (10 mg total) by mouth daily. Karen Grates, MD Taking Active Nursing Home Medication Administration Guide (MAG)  loratadine  (CLARITIN ) 10 MG tablet [id number] No Take 10 mg by mouth daily. [provider] Taking Active Nursing Home Medication Administration Guide (MAG)  metoprolol  tartrate (LOPRESSOR ) 25 MG tablet [id number] No Take 1 tablet (25 mg total) by mouth 2 (two) times daily. Karen Oman, MD Taking Active   nitroGLYCERIN  (NITROSTAT ) 0.4 MG SL tablet [id number] No DISSOLVE 1 TABLET UNDER TONGUE FOR CHESTPAIN.MAY REPEAT EVERY 5 MINUTES FOR 3 DOSES.IF NO RELIEF CALL 911 OR GO TO ER Rai, Hurman Maiden, MD Taking Active Nursing Home Medication Administration Guide (MAG)           Med Note (WARD, ANGELICA G   Tue Jan 14, 2024  2:12 PM)    oxyCODONE  (OXY IR/ROXICODONE ) 5 MG immediate release tablet [id number] No Take 1  tablet (5 mg total) by mouth every 8 (eight) hours as needed for severe pain (pain score 7-10). Karen Oman, MD Taking Active   potassium chloride  (KLOR-CON ) 10 MEQ tablet [id number] No TAKE ONE (1) TABLET BY MOUTH TWO (2) TIMES DAILY  Patient taking differently: Take 10 mEq by mouth 2 (two) times daily.   Karen Huger, FNP Taking Active Nursing Home Medication Administration Guide (MAG)  rOPINIRole  (REQUIP ) 1 MG tablet [id number] No Take 1 mg by mouth daily as needed (restless legs). [provider] Taking Active Nursing Home Medication Administration Guide (MAG)  rosuvastatin  (CRESTOR ) 20 MG tablet [id number]  TAKE ONE (1) TABLET BY MOUTH EVERY DAY Karen Huger, FNP  Active   sertraline  (ZOLOFT ) 50 MG tablet [id number] No Take 1 tablet (50 mg total) by mouth daily. Karen Huger, FNP Taking Active Nursing Home Medication Administration Guide (MAG)  traZODone  (DESYREL ) 50 MG tablet [id number] No Take 1 tablet (50 mg total) by mouth at bedtime as needed for sleep. Karen Huger, FNP Taking Active  Nursing Home Medication Administration Guide (MAG)  Med List Note (Ward, Angelica, CPhT 01/14/24 1413): [facility] [phone]             Assessment/Plan:   Diabetes: - Currently uncontrolled--patient does not want to be on multiple medications - Reviewed long term cardiovascular and renal outcomes of uncontrolled blood sugar - Reviewed goal A1c, goal fasting, and goal 2 hour post prandial glucose - Reviewed dietary modifications including FOLLOWING A HEART HEALTHY DIET/HEALTHY PLATE METHOD - Reviewed lifestyle modifications including:  - Recommend to : Patient to restart Ozempic  low dose 0.25mg  weekly (reports she has the red boxes at home) Continue insulin --encouraged compliance (not taking due to fear of hypoglycemia, doesn't want to take medicin) - Patient denies personal or family history of multiple endocrine neoplasia type 2, medullary thyroid  cancer; personal  history of pancreatitis or gallbladder disease. - Recommend to check glucose using libre FSL3-->3+   Follow Up Plan: 1 month   [name], PharmD, BCACP, CPP Clinical Pharmacist, [facility]

## 2024-02-11 NOTE — Progress Notes (Signed)
 Patient is in office today for a nurse visit for B12 Injection. Patient Injection was given in the  Left deltoid. Patient tolerated injection well.

## 2024-02-12 ENCOUNTER — Other Ambulatory Visit (INDEPENDENT_AMBULATORY_CARE_PROVIDER_SITE_OTHER): Admitting: Pharmacist

## 2024-02-12 ENCOUNTER — Telehealth: Payer: Self-pay

## 2024-02-12 DIAGNOSIS — Z7985 Long-term (current) use of injectable non-insulin antidiabetic drugs: Secondary | ICD-10-CM

## 2024-02-12 DIAGNOSIS — E119 Type 2 diabetes mellitus without complications: Secondary | ICD-10-CM

## 2024-02-12 DIAGNOSIS — Z794 Long term (current) use of insulin: Secondary | ICD-10-CM

## 2024-02-12 MED ORDER — FREESTYLE LIBRE 3 PLUS SENSOR MISC
5 refills | Status: DC
Start: 2024-02-12 — End: 2024-07-23

## 2024-02-12 NOTE — Telephone Encounter (Signed)
 [name] aware of correct weight parameters.

## 2024-02-12 NOTE — Telephone Encounter (Signed)
 Copied from CRM [id number]. Topic: Clinical - Lab/Test Results >> Feb 12, 2024 10:30 AM Tiffany H wrote: Reason for CRM: Sammie Crigler Occupational Therapist [facility]   Just finished initial consult. William weighed patient at 160, we have 160lbs listed. Parameters are currently set for patient to be 137-147. Patient hasn't been that weight since she was very young. Please update parameters.   Phone [phone] Fax: [phone]

## 2024-02-12 NOTE — Telephone Encounter (Signed)
 Not a patient with this office  Forwarding to patient's listed PCP

## 2024-02-12 NOTE — Telephone Encounter (Signed)
 Notify for weight <155 or >165.

## 2024-02-13 ENCOUNTER — Ambulatory Visit (INDEPENDENT_AMBULATORY_CARE_PROVIDER_SITE_OTHER)

## 2024-02-13 ENCOUNTER — Telehealth: Payer: Self-pay | Admitting: Orthopedic Surgery

## 2024-02-13 DIAGNOSIS — E1122 Type 2 diabetes mellitus with diabetic chronic kidney disease: Secondary | ICD-10-CM

## 2024-02-13 DIAGNOSIS — I2510 Atherosclerotic heart disease of native coronary artery without angina pectoris: Secondary | ICD-10-CM | POA: Diagnosis not present

## 2024-02-13 DIAGNOSIS — I509 Heart failure, unspecified: Secondary | ICD-10-CM | POA: Diagnosis not present

## 2024-02-13 DIAGNOSIS — E1165 Type 2 diabetes mellitus with hyperglycemia: Secondary | ICD-10-CM | POA: Diagnosis not present

## 2024-02-13 DIAGNOSIS — E1136 Type 2 diabetes mellitus with diabetic cataract: Secondary | ICD-10-CM | POA: Diagnosis not present

## 2024-02-13 DIAGNOSIS — R569 Unspecified convulsions: Secondary | ICD-10-CM

## 2024-02-13 DIAGNOSIS — F339 Major depressive disorder, recurrent, unspecified: Secondary | ICD-10-CM

## 2024-02-13 DIAGNOSIS — D518 Other vitamin B12 deficiency anemias: Secondary | ICD-10-CM | POA: Diagnosis not present

## 2024-02-13 DIAGNOSIS — I130 Hypertensive heart and chronic kidney disease with heart failure and stage 1 through stage 4 chronic kidney disease, or unspecified chronic kidney disease: Secondary | ICD-10-CM | POA: Diagnosis not present

## 2024-02-13 DIAGNOSIS — S42021D Displaced fracture of shaft of right clavicle, subsequent encounter for fracture with routine healing: Secondary | ICD-10-CM | POA: Diagnosis not present

## 2024-02-13 DIAGNOSIS — E782 Mixed hyperlipidemia: Secondary | ICD-10-CM

## 2024-02-13 DIAGNOSIS — N1832 Chronic kidney disease, stage 3b: Secondary | ICD-10-CM

## 2024-02-13 NOTE — Telephone Encounter (Signed)
 Dr. Delfino Fellers pt - Jim Motts w/[facility] [phone] lvm stating she needs to clarify if the patient has any restrictions for her rt shoulder.

## 2024-02-14 NOTE — Telephone Encounter (Signed)
 I did not get this message or I would have called her no pushing pulling or lifting greater than 5 lbs is what I told her,  Sorry if I get the message happy to call.

## 2024-02-18 ENCOUNTER — Ambulatory Visit (INDEPENDENT_AMBULATORY_CARE_PROVIDER_SITE_OTHER)

## 2024-02-18 DIAGNOSIS — E538 Deficiency of other specified B group vitamins: Secondary | ICD-10-CM

## 2024-02-18 NOTE — Progress Notes (Signed)
 Patient is in office today for a nurse visit for B12 Injection. Patient Injection was given in the  Left deltoid. Patient tolerated injection well.

## 2024-02-21 ENCOUNTER — Telehealth: Payer: Self-pay | Admitting: Family Medicine

## 2024-02-21 NOTE — Telephone Encounter (Signed)
 Called and spoke with patient and made her aware that we have her Novolog  order in the fridge that she needs to come by and pick up. Pt says no one has called her about this but said she would have a family member come by the office to pick it up.

## 2024-02-25 ENCOUNTER — Ambulatory Visit (INDEPENDENT_AMBULATORY_CARE_PROVIDER_SITE_OTHER)

## 2024-02-25 DIAGNOSIS — E538 Deficiency of other specified B group vitamins: Secondary | ICD-10-CM

## 2024-02-25 NOTE — Progress Notes (Signed)
 Patient is in office today for a nurse visit for B12 Injection. Patient Injection was given in the  Left deltoid. Patient tolerated injection well.

## 2024-02-28 ENCOUNTER — Encounter: Payer: Self-pay | Admitting: Family Medicine

## 2024-02-28 ENCOUNTER — Ambulatory Visit: Admitting: Family Medicine

## 2024-02-28 VITALS — BP 138/77 | HR 67 | Temp 98.1°F | Ht 66.0 in | Wt 156.8 lb

## 2024-02-28 DIAGNOSIS — R300 Dysuria: Secondary | ICD-10-CM | POA: Diagnosis not present

## 2024-02-28 DIAGNOSIS — E1169 Type 2 diabetes mellitus with other specified complication: Secondary | ICD-10-CM

## 2024-02-28 DIAGNOSIS — E1159 Type 2 diabetes mellitus with other circulatory complications: Secondary | ICD-10-CM | POA: Diagnosis not present

## 2024-02-28 DIAGNOSIS — N760 Acute vaginitis: Secondary | ICD-10-CM

## 2024-02-28 DIAGNOSIS — Z7985 Long-term (current) use of injectable non-insulin antidiabetic drugs: Secondary | ICD-10-CM | POA: Diagnosis not present

## 2024-02-28 DIAGNOSIS — F339 Major depressive disorder, recurrent, unspecified: Secondary | ICD-10-CM

## 2024-02-28 DIAGNOSIS — Z794 Long term (current) use of insulin: Secondary | ICD-10-CM

## 2024-02-28 DIAGNOSIS — I509 Heart failure, unspecified: Secondary | ICD-10-CM

## 2024-02-28 DIAGNOSIS — K219 Gastro-esophageal reflux disease without esophagitis: Secondary | ICD-10-CM | POA: Diagnosis not present

## 2024-02-28 DIAGNOSIS — N1832 Chronic kidney disease, stage 3b: Secondary | ICD-10-CM | POA: Diagnosis not present

## 2024-02-28 DIAGNOSIS — Z7984 Long term (current) use of oral hypoglycemic drugs: Secondary | ICD-10-CM

## 2024-02-28 DIAGNOSIS — Z1231 Encounter for screening mammogram for malignant neoplasm of breast: Secondary | ICD-10-CM

## 2024-02-28 DIAGNOSIS — E1165 Type 2 diabetes mellitus with hyperglycemia: Secondary | ICD-10-CM

## 2024-02-28 LAB — MICROSCOPIC EXAMINATION
RBC, Urine: NONE SEEN /HPF (ref 0–2)
Renal Epithel, UA: NONE SEEN /HPF
WBC, UA: NONE SEEN /HPF (ref 0–5)
Yeast, UA: NONE SEEN

## 2024-02-28 LAB — URINALYSIS, ROUTINE W REFLEX MICROSCOPIC
Bilirubin, UA: NEGATIVE
Ketones, UA: NEGATIVE
Leukocytes,UA: NEGATIVE
Nitrite, UA: NEGATIVE
Protein,UA: NEGATIVE
Specific Gravity, UA: <1.005 — ABNORMAL LOW (ref 1.005–1.030)
Urobilinogen, Ur: 2 mg/dL — ABNORMAL HIGH (ref 0.2–1.0)
pH, UA: 6 (ref 5.0–7.5)

## 2024-02-28 LAB — BAYER DCA HB A1C WAIVED: HB A1C (BAYER DCA - WAIVED): 8 % — ABNORMAL HIGH (ref 4.8–5.6)

## 2024-02-28 MED ORDER — FAMOTIDINE 20 MG PO TABS: 20.0000 mg | ORAL_TABLET | Freq: Two times a day (BID) | ORAL | 3 refills | Status: AC

## 2024-02-28 MED ORDER — CLOTRIMAZOLE 1 % VA CREA: 1.0000 | TOPICAL_CREAM | Freq: Every day | VAGINAL | 0 refills | Status: AC

## 2024-02-28 NOTE — Progress Notes (Signed)
 Established Patient Office Visit  Subjective:  Patient ID: [name], female    DOB: April 09, 1951  Age: 73 y.o. MRN: [id number]  CC:  Chief Complaint  Patient presents with   Medical Management of Chronic Issues    HPI [name] presents for chronic follow up.   1. DM Patient denies foot ulcerations, paresthesia of the feet, vomiting and weight loss.   Current diabetic medications include: lantus  25-30 untis, novolog  sliding scale.   Saw julie and was reccommended to restart on Ozempic  0.25. She has not done this and doesn't wish to.   Current monitoring regimen: home blood tests - CGM Home blood sugar records: above target 55%, in target 41%, below 4% Any episodes of hypoglycemia? 1 in last week   Eye exam current (within one year): yes Weight trend: stable   Is She on ACE inhibitor or angiotensin II receptor blocker?  yes Is She on statin? Yes crestor    2. HTN/Cardiac Complaint with meds - Yes but didn't take her medication today.  Current Medications - lisinopril , metoprolol , lasix  Pertinent ROS:  Fatigue - improved Visual Disturbances - No Chest pain - No Dyspnea - No Palpitations - No LE edema - minimal, baseline. Taking lasix  prn.   S/P CABG. On plavix , statin.   3. HLD On statin. Regular diet, no exercise.   4. Depression, anxiety Compliant with zoloft  and trazodone . Increased symptoms lately due to stress related to finances. Her rent just increased.   5. Vomiting She was having periods of NBMB emesis every few weeks. This resolved when she started B12 supplementation. No abdominal pain, fever. She has been having heartburn and water  brash. Feels like she gets full quickly. No dysphagia, diarrhea, rectal bleeding, blood in stool. Hx of gastric ulcer.   6. Dysuria Vaginal itching with intermittent dysuria for 3-4 days. No other symptoms. Hx of recurrent UTI.      02/28/2024    9:10 AM 01/31/2024    2:35 PM 10/09/2023   10:26 AM  Depression screen  PHQ 2/9  Decreased Interest 0 0 1  Down, Depressed, Hopeless 0 0 0  PHQ - 2 Score 0 0 1  Altered sleeping 0 0 1  Tired, decreased energy 3 0 1  Change in appetite 3 0 1  Feeling bad or failure about yourself  0 0 0  Trouble concentrating 0 0 0  Moving slowly or fidgety/restless 0 0 0  Suicidal thoughts 0 0 0  PHQ-9 Score 6 0 4  Difficult doing work/chores Very difficult Not difficult at all Somewhat difficult      02/28/2024    9:11 AM 01/31/2024    2:35 PM 10/09/2023   10:27 AM 08/27/2023    4:12 PM  GAD 7 : Generalized Anxiety Score  Nervous, Anxious, on Edge 3 0 0 1  Control/stop worrying 3 0 0 1  Worry too much - different things 3 0 0 1  Trouble relaxing 1 0 0 1  Restless 1 0 0 0  Easily annoyed or irritable 2 0 0 0  Afraid - awful might happen 3 0 0 0  Total GAD 7 Score 16 0 0 4  Anxiety Difficulty Very difficult Not difficult at all Not difficult at all Somewhat difficult     Past Medical History:  Diagnosis Date   Anxiety    CAD (coronary artery disease)    DES to circumflex 02/2007, BMS to LAD and PTCA diagonal 03/2007   Carotid artery plaque  Mild   Cataract    Depression    Diverticulitis, colon    Elevated d-dimer 01/08/2014   Essential hypertension, benign    GERD (gastroesophageal reflux disease)    H/O hiatal hernia    HLD (hyperlipidemia)    IDDM (insulin  dependent diabetes mellitus)    Migraine    "used to have them really bad; don't have them anymore" (01/07/2014)   MS (multiple sclerosis) (HCC)    Not confirmed   PAT (paroxysmal atrial tachycardia) (HCC)    Prolapse of uterus    PVD (peripheral vascular disease) (HCC)    TIA (transient ischemic attack) 1980's    Past Surgical History:  Procedure Laterality Date   ABDOMINAL HYSTERECTOMY  1986   ovaries remain - prolaspe uterus    APPENDECTOMY  ~ 1970   BREAST BIOPSY Right 1980's   BREAST LUMPECTOMY Right 1980's   Dr. Lucinda Saber    CARDIAC CATHETERIZATION  01/07/2014   CHOLECYSTECTOMY  ?1987    COLONOSCOPY  2002   Dr. Anwar--> Severe diverticular changes in the region of the sigmoid and descending colon with scattered diverticular changes throughout the rest of the colon. No polyps, ulcerations. Despite numerous manipulations, the tip of the scope could not be tipped into the cecal area.   COLONOSCOPY  01/10/2012   Procedure: COLONOSCOPY;  Surgeon: Suzette Espy, MD;  [facility];  Service: Endoscopy;  Laterality: N/A;  1:55   CORONARY ANGIOPLASTY WITH STENT PLACEMENT  ~ 1997 X 2   "2 + 1"   CORONARY ARTERY BYPASS GRAFT N/A 02/19/2023   Procedure: OFF PUMP CORONARY ARTERY BYPASS GRAFTING (CABG) X 1;  Surgeon: Hilarie Lovely, MD;  Location: [facility] OR;  Service: Open Heart Surgery;  Laterality: N/A;  LIMA TO LAD   CORONARY BALLOON ANGIOPLASTY N/A 10/05/2022   Procedure: CORONARY BALLOON ANGIOPLASTY;  Surgeon: Arnoldo Lapping, MD;  Location: [facility] INVASIVE CV LAB;  Service: Cardiovascular;  Laterality: N/A;   CORONARY PRESSURE/FFR STUDY N/A 03/08/2017   Procedure: Intravascular Pressure Wire/FFR Study;  Surgeon: Sammy Crisp, MD;  Location: [facility] INVASIVE CV LAB;  Service: Cardiovascular;  Laterality: N/A;   CORONARY STENT INTERVENTION N/A 03/26/2022   Procedure: CORONARY STENT INTERVENTION;  Surgeon: Avanell Leigh, MD;  Location: [facility] INVASIVE CV LAB;  Service: Cardiovascular;  Laterality: N/A;   EYE SURGERY Bilateral 2014   cataract   LEFT HEART CATH AND CORONARY ANGIOGRAPHY N/A 03/08/2017   Procedure: Left Heart Cath and Coronary Angiography;  Surgeon: Sammy Crisp, MD;  Location: [facility] INVASIVE CV LAB;  Service: Cardiovascular;  Laterality: N/A;   LEFT HEART CATH AND CORONARY ANGIOGRAPHY N/A 03/26/2022   Procedure: LEFT HEART CATH AND CORONARY ANGIOGRAPHY;  Surgeon: Avanell Leigh, MD;  Location: [facility] INVASIVE CV LAB;  Service: Cardiovascular;  Laterality: N/A;   LEFT HEART CATH AND CORONARY ANGIOGRAPHY N/A 10/05/2022   Procedure: LEFT HEART CATH AND CORONARY ANGIOGRAPHY;   Surgeon: Arnoldo Lapping, MD;  Location: [facility] INVASIVE CV LAB;  Service: Cardiovascular;  Laterality: N/A;   LEFT HEART CATH AND CORONARY ANGIOGRAPHY N/A 02/14/2023   Procedure: LEFT HEART CATH AND CORONARY ANGIOGRAPHY;  Surgeon: Swaziland, Peter M, MD;  Location: [facility] INVASIVE CV LAB;  Service: Cardiovascular;  Laterality: N/A;   LEFT HEART CATHETERIZATION WITH CORONARY ANGIOGRAM N/A 01/07/2014   Procedure: LEFT HEART CATHETERIZATION WITH CORONARY ANGIOGRAM;  Surgeon: Darlis Eisenmenger, MD;  Location: [facility] CATH LAB;  Service: Cardiovascular;  Laterality: N/A;   TEE WITHOUT CARDIOVERSION N/A 02/19/2023   Procedure: TRANSESOPHAGEAL  ECHOCARDIOGRAM;  Surgeon: Hilarie Lovely, MD;  Location: [facility];  Service: Open Heart Surgery;  Laterality: N/A;    Family History  Problem Relation Age of Onset   Heart attack Mother 60   Diabetes Mother    Hypertension Mother    Heart attack Father 51   Heart attack Brother 32       x 6   Heart disease Brother    Diabetes Brother    Colon cancer Paternal Aunt        54s, died with brain anuerysm   Crohn's disease Cousin        paternal   Diabetes Sister    GER disease Daughter    Cervical cancer Daughter    Diabetes Daughter     Social History   Socioeconomic History   Marital status: Widowed    Spouse name: Not on file   Number of children: 4   Years of education: 24   Highest education level: 11th grade  Occupational History   Occupation: Disability    Employer: DISABLED  Tobacco Use   Smoking status: Never   Smokeless tobacco: Never   Tobacco comments:    spouse, 41 years - husband has quit 01/2011  Vaping Use   Vaping status: Never Used  Substance and Sexual Activity   Alcohol use: No   Drug use: No   Sexual activity: Not Currently  Other Topics Concern   Not on file  Social History Narrative   Lives alone, one level, handicap accessible bathroom, her children all live nearby   Social Drivers of Health   Financial Resource Strain: Low  Risk  (01/24/2023)   Overall Financial Resource Strain (CARDIA)    Difficulty of Paying Living Expenses: Not hard at all  Food Insecurity: No Food Insecurity (01/14/2024)   Hunger Vital Sign    Worried About Running Out of Food in the Last Year: Never true    Ran Out of Food in the Last Year: Never true  Transportation Needs: No Transportation Needs (01/14/2024)   PRAPARE - Administrator, Civil Service (Medical): No    Lack of Transportation (Non-Medical): No  Physical Activity: Insufficiently Active (01/24/2023)   Exercise Vital Sign    Days of Exercise per Week: 3 days    Minutes of Exercise per Session: 30 min  Stress: No Stress Concern Present (01/24/2023)   [employer] of Occupational Health - Occupational Stress Questionnaire    Feeling of Stress : Not at all  Social Connections: Moderately Integrated (01/14/2024)   Social Connection and Isolation Panel [NHANES]    Frequency of Communication with Friends and Family: More than three times a week    Frequency of Social Gatherings with Friends and Family: More than three times a week    Attends Religious Services: More than 4 times per year    Active Member of [employer] or Organizations: Yes    Attends Banker Meetings: More than 4 times per year    Marital Status: Widowed  Recent Concern: Social Connections - Moderately Isolated (11/29/2023)   Social Connection and Isolation Panel [NHANES]    Frequency of Communication with Friends and Family: More than three times a week    Frequency of Social Gatherings with Friends and Family: More than three times a week    Attends Religious Services: More than 4 times per year    Active Member of [employer] or Organizations: No    Attends Banker Meetings: Never  Marital Status: Widowed  Intimate Partner Violence: Not At Risk (01/14/2024)   Humiliation, Afraid, Rape, and Kick questionnaire    Fear of Current or Ex-Partner: No    Emotionally Abused: No     Physically Abused: No    Sexually Abused: No    Outpatient Medications Prior to Visit  Medication Sig Dispense Refill   acetaminophen  (TYLENOL ) 500 MG tablet Take 1,000 mg by mouth every 6 (six) hours as needed for mild pain (pain score 1-3) or headache.     aspirin  81 MG chewable tablet Chew 1 tablet (81 mg total) by mouth daily. 90 tablet 3   Blood Glucose Monitoring Suppl DEVI 1 each by Does not apply route in the morning, at noon, and at bedtime. May substitute to any manufacturer covered by patient's insurance. 1 each 0   clopidogrel  (PLAVIX ) 75 MG tablet Take 1 tablet (75 mg total) by mouth daily with breakfast. 90 tablet 3   Continuous Glucose Sensor (FREESTYLE LIBRE 3 PLUS SENSOR) MISC Change sensor every 15 days. Apply to back of upper arm. DX: E11.65 2 each 5   cyanocobalamin  (VITAMIN B12) 1000 MCG/ML injection Inject 1 mL daily for 5 more days; then weekly x 1 month and then monthly after that. 15 mL 1   furosemide  (LASIX ) 20 MG tablet Take 1 tablet (20 mg total) by mouth daily. 90 tablet 1   insulin  aspart (NOVOLOG  FLEXPEN) 100 UNIT/ML FlexPen Inject 0-6 Units into the skin 3 (three) times daily with meals. Inject per sliding scale 0-200 = 0 units, if less than 70 notify provider 201-250 = 2 units 251-300 = 3 units 301-350 = 4 units 351-400 = 5 units 401-450 = 6 units >450 = notify provider     insulin  glargine (LANTUS ) 100 UNIT/ML injection Inject 0.2 mLs (20 Units total) into the skin 2 (two) times daily. (Patient taking differently: Inject 25-30 Units into the skin daily.)     lisinopril  (ZESTRIL ) 10 MG tablet Take 1 tablet (10 mg total) by mouth daily. 90 tablet 3   loratadine  (CLARITIN ) 10 MG tablet Take 10 mg by mouth daily.     metoprolol  tartrate (LOPRESSOR ) 25 MG tablet Take 1 tablet (25 mg total) by mouth 2 (two) times daily. 60 tablet 2   nitroGLYCERIN  (NITROSTAT ) 0.4 MG SL tablet DISSOLVE 1 TABLET UNDER TONGUE FOR CHESTPAIN.MAY REPEAT EVERY 5 MINUTES FOR 3 DOSES.IF NO  RELIEF CALL 911 OR GO TO ER 25 tablet 3   potassium chloride  (KLOR-CON ) 10 MEQ tablet TAKE ONE (1) TABLET BY MOUTH TWO (2) TIMES DAILY (Patient taking differently: Take 10 mEq by mouth 2 (two) times daily.) 180 tablet 0   rOPINIRole  (REQUIP ) 1 MG tablet Take 1 mg by mouth daily as needed (restless legs).     rosuvastatin  (CRESTOR ) 20 MG tablet TAKE ONE (1) TABLET BY MOUTH EVERY DAY 90 tablet 1   sertraline  (ZOLOFT ) 50 MG tablet Take 1 tablet (50 mg total) by mouth daily. 90 tablet 3   traZODone  (DESYREL ) 50 MG tablet Take 1 tablet (50 mg total) by mouth at bedtime as needed for sleep. 90 tablet 3   oxyCODONE  (OXY IR/ROXICODONE ) 5 MG immediate release tablet Take 1 tablet (5 mg total) by mouth every 8 (eight) hours as needed for severe pain (pain score 7-10). 15 tablet 0   Facility-Administered Medications Prior to Visit  Medication Dose Route Frequency Provider Last Rate Last Admin   cyanocobalamin  (VITAMIN B12) injection 1,000 mcg  1,000 mcg Intramuscular Weekly Britta Candy, Kennah Hehr  M, FNP   1,000 mcg at 02/25/24 0907    Allergies  Allergen Reactions   Iohexol       Desc: pt had syncopal episode with nausea post IV CM late 1990's,  pt has had prednisone  prep with heart caths x 2 without problem  kdean 04/16/07, Onset Date: [id number]    Ticlid [Ticlopidine Hcl] Nausea And Vomiting   Jardiance  [Empagliflozin ] Other (See Comments)    Recurrent UTIs   Metformin  And Related Diarrhea   Codeine Nausea And Vomiting and Palpitations    ROS Review of Systems As per HPI.   Objective:    Physical Exam Vitals and nursing note reviewed.  Constitutional:      General: She is not in acute distress.    Appearance: Normal appearance. She is not ill-appearing, toxic-appearing or diaphoretic.  HENT:     Head: Normocephalic and atraumatic.  Eyes:     General: No scleral icterus. Neck:     Thyroid : No thyroid  mass, thyromegaly or thyroid  tenderness.  Cardiovascular:     Rate and Rhythm: Normal rate and  regular rhythm.     Heart sounds: Normal heart sounds. No murmur heard. Pulmonary:     Effort: Pulmonary effort is normal. No respiratory distress.     Breath sounds: Normal breath sounds.  Abdominal:     General: Bowel sounds are normal. There is no distension.     Palpations: Abdomen is soft.     Tenderness: There is no abdominal tenderness. There is no right CVA tenderness, left CVA tenderness, guarding or rebound.  Musculoskeletal:     Cervical back: Neck supple. No rigidity.     Right lower leg: No edema.     Left lower leg: No edema.  Skin:    General: Skin is warm and dry.     Coloration: Skin is not jaundiced.  Neurological:     Mental Status: She is alert and oriented to person, place, and time. Mental status is at baseline.     Gait: Gait normal.  Psychiatric:        Mood and Affect: Mood normal.        Behavior: Behavior normal.        Thought Content: Thought content normal.        Judgment: Judgment normal.    BP 138/77   Pulse 67   Temp 98.1 F (36.7 C) (Temporal)   Ht 5\' 6"  (1.676 m)   Wt 156 lb 12.8 oz (71.1 kg)   SpO2 99%   BMI 25.31 kg/m  Wt Readings from Last 3 Encounters:  02/28/24 156 lb 12.8 oz (71.1 kg)  02/10/24 160 lb (72.6 kg)  01/31/24 161 lb 6.4 oz (73.2 kg)   Urine dipstick shows positive for RBC's and positive for glucose.  Micro exam: 0 WBC's per HPF, 0 RBC's per HPF, and few+ bacteria.   There are no preventive care reminders to display for this patient.  Assessment & Plan:   Type 2 diabetes mellitus with hyperglycemia, with long-term current use of insulin  (HCC) A1c 8.0 today, not at goal of <7. Reviewed pharm note. She did not restart ozempic  and does not wish to. Medication changes today: She declines any changes today. She is on an ACE/ARB and statin. Diet and exercise.  -     Bayer DCA Hb A1c Waived -     CBC with Differential/Platelet  Long term current use of oral hypoglycemic drug  Long-term current use of injectable  noninsulin antidiabetic medication  Hypertension associated with diabetes (HCC) Well controlled on current regimen.   Hyperlipidemia associated with type 2 diabetes mellitus (HCC) On statin. Last LDL 42  Stage 3b chronic kidney disease (HCC) On ACE. Avoid NSAIDs.  -     CMP14+EGFR  Congestive heart failure, unspecified HF chronicity, unspecified heart failure type (HCC) Euvolemic today.   Depression, recurrent (HCC) Stable. Denies SI. Continue zoloft .   Dysuria UA not compelling for UTI. Suspect yeast vaginitis as cause to dysuria. Culture pending.  -     Urinalysis, Routine w reflex microscopic -     Urine Culture  Acute vaginitis Diflucan  contraindicated with QT prolongation. Clotrimazole as below.  -     Clotrimazole; Place 1 Applicatorful vaginally at bedtime for 7 days.  Dispense: 7 g; Refill: 0  Gastroesophageal reflux disease without esophagitis Start pepcid  BID. PPIs decrease effectiveness of plavix .  -     Famotidine ; Take 1 tablet (20 mg total) by mouth 2 (two) times daily.  Dispense: 180 tablet; Refill: 3  Encounter for screening mammogram for malignant neoplasm of breast Benign right mass- recommend diagnostic mammo for follow up in 6 months. Overdue as last mammo was in 2023. -     MM 3D DIAGNOSTIC MAMMOGRAM BILATERAL BREAST; Future  Return in about 3 months (around 05/30/2024) for chronic follow up.   The patient indicates understanding of these issues and agrees with the plan.  Total time spent caring for the patient today was 41 minutes. This includes time spent before the visit reviewing the chart, time spent during the visit, and time spent after the visit on documentation.   Albertha Huger, FNP

## 2024-02-29 LAB — CBC WITH DIFFERENTIAL/PLATELET
Basophils Absolute: 0 10*3/uL (ref 0.0–0.2)
Basos: 0 %
EOS (ABSOLUTE): 0.2 10*3/uL (ref 0.0–0.4)
Eos: 2 %
Hematocrit: 36.2 % (ref 34.0–46.6)
Hemoglobin: 11.7 g/dL (ref 11.1–15.9)
Immature Grans (Abs): 0 10*3/uL (ref 0.0–0.1)
Immature Granulocytes: 0 %
Lymphocytes Absolute: 2.1 10*3/uL (ref 0.7–3.1)
Lymphs: 29 %
MCH: 30 pg (ref 26.6–33.0)
MCHC: 32.3 g/dL (ref 31.5–35.7)
MCV: 93 fL (ref 79–97)
Monocytes Absolute: 0.4 10*3/uL (ref 0.1–0.9)
Monocytes: 5 %
Neutrophils Absolute: 4.6 10*3/uL (ref 1.4–7.0)
Neutrophils: 64 %
Platelets: 169 10*3/uL (ref 150–450)
RBC: 3.9 x10E6/uL (ref 3.77–5.28)
RDW: 13.2 % (ref 11.7–15.4)
WBC: 7.3 10*3/uL (ref 3.4–10.8)

## 2024-02-29 LAB — CMP14+EGFR
ALT: 5 IU/L (ref 0–32)
AST: 14 IU/L (ref 0–40)
Albumin: 3.9 g/dL (ref 3.8–4.8)
Alkaline Phosphatase: 85 IU/L (ref 44–121)
BUN/Creatinine Ratio: 9 — ABNORMAL LOW (ref 12–28)
BUN: 9 mg/dL (ref 8–27)
Bilirubin Total: 0.4 mg/dL (ref 0.0–1.2)
CO2: 26 mmol/L (ref 20–29)
Calcium: 9.3 mg/dL (ref 8.7–10.3)
Chloride: 102 mmol/L (ref 96–106)
Creatinine, Ser: 1 mg/dL (ref 0.57–1.00)
Globulin, Total: 2.4 g/dL (ref 1.5–4.5)
Glucose: 109 mg/dL — ABNORMAL HIGH (ref 70–99)
Potassium: 3.3 mmol/L — ABNORMAL LOW (ref 3.5–5.2)
Sodium: 143 mmol/L (ref 134–144)
Total Protein: 6.3 g/dL (ref 6.0–8.5)
eGFR: 59 mL/min/{1.73_m2} — ABNORMAL LOW (ref >59)

## 2024-03-01 LAB — URINE CULTURE

## 2024-03-02 ENCOUNTER — Ambulatory Visit: Payer: Self-pay | Admitting: Family Medicine

## 2024-03-02 ENCOUNTER — Other Ambulatory Visit: Payer: Self-pay

## 2024-03-02 DIAGNOSIS — E876 Hypokalemia: Secondary | ICD-10-CM

## 2024-03-02 MED ORDER — POTASSIUM CHLORIDE CRYS ER 10 MEQ PO TBCR: 20.0000 meq | EXTENDED_RELEASE_TABLET | Freq: Two times a day (BID) | ORAL | 3 refills | Status: DC

## 2024-03-03 ENCOUNTER — Telehealth: Payer: Self-pay

## 2024-03-03 NOTE — Progress Notes (Signed)
   Telephone encounter was:  Unsuccessful.  03/03/2024 Name: LEONTINA SKIDMORE MRN: [id number] DOB: 03/28/1951  Unsuccessful outbound call made today to assist with:  Financial Difficulties related to financial strain  Outreach Attempt:  1st Attempt  No answer   Azell Leopard [facility]  [facility] Guide, Phone: [phone] Fax: [phone] Website: [email or web address]

## 2024-03-03 NOTE — Progress Notes (Signed)
 Complex Care Management Note  Care Guide Note 03/03/2024 Name: [name] MRN: [id number] DOB: 02-11-1951  [name] is a 73 y.o. year old female who sees Albertha Huger, FNP for primary care. I reached out to Sheryn Doom by phone today to offer complex care management services.  [name] was given information about Complex Care Management services today including:   The Complex Care Management services include support from the care team which includes your Nurse Care Manager, Clinical Social Worker, or Pharmacist.  The Complex Care Management team is here to help remove barriers to the health concerns and goals most important to you. Complex Care Management services are voluntary, and the patient may decline or stop services at any time by request to their care team member.   Complex Care Management Consent Status: Patient agreed to services and verbal consent obtained.   Follow up plan:  Telephone appointment with complex care management team member scheduled for:  03/25/2024  Encounter Outcome:  Patient Scheduled  Lenton Rail , RMA     [place]  [facility] Guide  Direct Dial: [phone]  Website: [email or web address]

## 2024-03-04 ENCOUNTER — Other Ambulatory Visit: Payer: Self-pay | Admitting: Family Medicine

## 2024-03-05 ENCOUNTER — Telehealth: Payer: Self-pay

## 2024-03-05 DIAGNOSIS — Z1231 Encounter for screening mammogram for malignant neoplasm of breast: Secondary | ICD-10-CM

## 2024-03-05 NOTE — Telephone Encounter (Signed)
 Copied from CRM [id number]. Topic: Clinical - Request for Lab/Test Order >> Mar 05, 2024 10:25 AM Opal Bill wrote: Reason for CRM: [facility] calling about this patient's order for the diagnostic mammogram. She needs clarification between the diagnosis with the code provided and the reason given which shows rt breast mass follow up. Please contact Caitlyn at [phone]. Voicemail is available.

## 2024-03-05 NOTE — Progress Notes (Signed)
   Telephone encounter was:  Unsuccessful.  03/05/2024 Name: KELLAN BOEHLKE MRN: [id number] DOB: 10/11/1950  Unsuccessful outbound call made today to assist with:  Financial Difficulties related to financial strain  Outreach Attempt:  2nd Attempt  Unable to leave a message    Azell Leopard [facility]  [facility] Guide, Phone: [phone] Fax: [phone] Website: [email or web address]

## 2024-03-05 NOTE — Addendum Note (Signed)
 Addended by: Lavern Crimi G on: 03/05/2024 03:17 PM   Modules accepted: Orders

## 2024-03-05 NOTE — Telephone Encounter (Signed)
 Spoke to Caitlyn at [facility] imaging and she states pt had diagnostic mammogram 07/17/2023 which showed recommendations of returning to screening mammo 08/2023 which pt did not have completed. Pt only needs screening mammogram order. Jyl Or is out of office today but will return tomorrow and will fax over new order for Screening Mammogram to [phone].

## 2024-03-06 NOTE — Addendum Note (Signed)
 Addended by: Albertha Huger on: 03/06/2024 12:44 PM   Modules accepted: Orders

## 2024-03-06 NOTE — Telephone Encounter (Signed)
 Screening mammo ordered.

## 2024-03-06 NOTE — Telephone Encounter (Signed)
 Mammogram order printed, signed and faxed.

## 2024-03-06 NOTE — Addendum Note (Signed)
 Addended by: Zaiyden Strozier G on: 03/06/2024 04:38 PM   Modules accepted: Orders

## 2024-03-08 ENCOUNTER — Other Ambulatory Visit: Payer: Self-pay | Admitting: Family Medicine

## 2024-03-09 ENCOUNTER — Telehealth: Payer: Self-pay

## 2024-03-09 ENCOUNTER — Telehealth: Payer: Self-pay | Admitting: Family Medicine

## 2024-03-09 NOTE — Telephone Encounter (Signed)
 Pt aware nitroglycerin  rx sent in.

## 2024-03-09 NOTE — Progress Notes (Signed)
   Telephone encounter was:  Unsuccessful.  03/09/2024 Name: KEEANA PIERATT MRN: [id number] DOB: April 03, 1951  Unsuccessful outbound call made today to assist with:  Financial Difficulties related to financial strain  Outreach Attempt:  3rd Attempt.  Referral closed unable to contact patient.  No answer and unable to leave a message    Azell Leopard [facility] Guide, Phone: [phone] Fax: [phone] Website: [email or web address]

## 2024-03-09 NOTE — Telephone Encounter (Signed)
 Copied from CRM [id number]. Topic: Clinical - Medication Question >> Mar 09, 2024 11:04 AM Tiffany S wrote: Reason for CRM: nitroGLYCERIN  (NITROSTAT ) 0.4 MG SL tablet [Pharmacy Med Name: NITROGLYCERIN  0.4 MG SL TAB] [[id number]]  Patient stated she needs medication today. Reorder was started yesterday please follow up with patient

## 2024-03-16 ENCOUNTER — Other Ambulatory Visit

## 2024-03-23 ENCOUNTER — Encounter: Admitting: Orthopedic Surgery

## 2024-03-23 ENCOUNTER — Telehealth: Payer: Self-pay | Admitting: Pharmacy Technician

## 2024-03-23 NOTE — Progress Notes (Signed)
   03/23/2024 Name: [name] MRN: [id number] DOB: [birth year]  Patient is appearing on a report for [facility] [place] [facility] and last engaged with the clinical pharmacist to discuss diabetes on 02/12/2024. Contacted patient today to discuss diabetes management and completed medication review.   Diabetes Plan from last clinical pharmacist appointment: Diabetes: - Currently uncontrolled--patient does not want to be on multiple medications - Reviewed long term cardiovascular and renal outcomes of uncontrolled blood sugar - Reviewed goal A1c, goal fasting, and goal 2 hour post prandial glucose - Reviewed dietary modifications including FOLLOWING A HEART HEALTHY DIET/HEALTHY PLATE METHOD - Reviewed lifestyle modifications including:  - Recommend to : Patient to restart Ozempic  low dose 0.25mg  weekly (reports she has the red boxes at home) Continue insulin --encouraged compliance (not taking due to fear of hypoglycemia, doesn't want to take medicin) - Patient denies personal or family history of multiple endocrine neoplasia type 2, medullary thyroid  cancer; personal history of pancreatitis or gallbladder disease. - Recommend to check glucose using libre FSL3-->3+ -Follow Up Plan: 1 month   Medication Adherence Barriers Identified:  Patient made recommended medication changes per plan: No Patient did NOT restart Ozempic . Patient informs she does NOT liking taking a lot of medications. Access issues with any new medication or testing device: Yes Patient informs she does NOT have Lantus  on file and does NOT take Lantus . She informs she has Tresiba  on hand and that is what she has been using. Patient is checking blood sugars as prescribed: Patient uses CGM to check her blood sugars. She informs her blood sugar during the day averages between 160-190. However, she informs her blood sugar drops over night between 58-68. She informs she takes a bag of marshmallows with her to bed and when the alert sounds  that blood sugar is low then she will eat a few marshmallows and the blood sugar comes back up. Patient informs she is using Tresiba  and NOT Lantus . She informs she has Tresiba  on hand and NOT Lantus . She informs she will check her blood sugar before taking any medications and self adjusts the dose based on the result. She informs she typically injects around 25 units. She typhically takes the Lantus  between 11am-1pm as she does not eat when she first gets up in the morning. Patient informs she is using Novolog . She informs she self adjusts the dose based on her blood sugar. She informs if she eats a small meal such as a salad she doesn't use the medication at all.  Medication Adherence Barriers Addressed/Actions Taken:  Reviewed medication changes per plan from last clinical pharmacist note Reviewed instructions for monitoring blood sugars at home and reminded patient to keep a written log to review with pharmacist Reminded patient of date/time of upcoming clinical pharmacist follow up and any upcoming PCP/specialists visits. Patient denies transportation barriers to the appointment. Yes  Next clinical pharmacist appointment is scheduled for: TBD   Kate Caddy, CPhT [facility] Office: [phone] Email: [email or web address]

## 2024-03-23 NOTE — Progress Notes (Unsigned)
 Cardiology Office Note:   Date:  03/25/2024  ID:  [name], DOB June 03, 1951, MRN [id number] PCP: [name], FNP  [facility] Providers Cardiologist:  Lynwood Schilling, MD {  History of Present Illness:   [name] is a 73 y.o. female with history of CAD, diabetes, and hyperlipidemia, with multiple interventions to her LAD and diagonal vessel as far back as 2008. Most recent intervention July 2023 where she had DES placed to her circumflex. Repeat cardiac cath January 2024 revealed severe in-stent restenosis treated with angioplasty.  She was readmitted to the hospital on 02/12/2023 due to recurrent unstable angina, with new anterior infarct. Repeat cardiac catheterization revealed significant LAD disease and she was planned for CABG. Patient had CABG x 1 utilizing LIMA to LAD on 02/19/2023.  She presents for follow up.  She has had some frequent falls and I did review records from her most recent hospitalization.  This was in April.  There is questionable seizure-like activity previously but no clear etiology was identified.  There is no mention that this was actual syncope.  There was some witnessed tonic-clonic activity confusion.  However, neurology workup was unremarkable.  She has not had any overt cardiac issues since her CABG other than the fact that she still gets chest discomfort.  This is intermittent.  It comes and goes.  It is sharp.  It is mid chest and somewhat radiating to her back.  She gets vomiting with it.  She actually took 3 nitroglycerin  yesterday and said after the third 1 spaced half hour apart that her symptoms went away.  She cannot bring it on.  It comes on at rest.  She is not having any new shortness of breath, PND or orthopnea.  She is not really describing palpitations.    ROS: As stated in the HPI and negative for all other systems.  Studies Reviewed:    EKG:   EKG Interpretation Date/Time:  Wednesday March 25 2024 10:07:08 EDT Ventricular Rate:   71 PR Interval:  182 QRS Duration:  82 QT Interval:  422 QTC Calculation: 458 R Axis:   -41  Text Interpretation: Normal sinus rhythm Left axis deviation Poor anterior R wave progression When compared with ECG of 09-Jan-2024 15:38, No significant change since last tracing Confirmed by Schilling Lynwood ([id number]) on 03/25/2024 10:41:51 AM     Risk Assessment/Calculations:   ]  Physical Exam:   VS:  BP (!) 152/90   Pulse 71   Ht 5' 6 (1.676 m)   Wt 159 lb (72.1 kg)   BMI 25.66 kg/m    Wt Readings from Last 3 Encounters:  03/25/24 159 lb (72.1 kg)  02/28/24 156 lb 12.8 oz (71.1 kg)  02/10/24 160 lb (72.6 kg)     GEN: Well nourished, well developed in no acute distress NECK: No JVD; No carotid bruits CARDIAC: RRR, no murmurs, rubs, gallops RESPIRATORY:  Clear to auscultation without rales, wheezing or rhonchi  ABDOMEN: Soft, non-tender, non-distended EXTREMITIES:  No edema; No deformity   ASSESSMENT AND PLAN:   Coronary artery disease: Status post CABG x 1 utilizing LIMA to LAD on 02/19/2023.  She is having some chest discomfort that sounds nonanginal but I am going to go ahead and try 60 mg of Imdur  to see if this improves her symptoms.  At this point I am not thinking of any further ischemia workup but will consider this if the discomfort continues without a clear etiology.   Hypertension: She  says her blood pressure is typically not elevated.  We will see if this comes down with isosorbide .  She should keep a blood pressure diary at home.   Dyslipidemia: LDL was 42.  No change in therapy.  Seizure like activity: This has not been described as syncope.  However, I will have her wear a 4-week monitor to make sure there are no significant dysrhythmias.     Follow up after the monitor.  Signed, Lynwood Schilling, MD

## 2024-03-25 ENCOUNTER — Ambulatory Visit: Admitting: Cardiology

## 2024-03-25 ENCOUNTER — Encounter: Payer: Self-pay | Admitting: Cardiology

## 2024-03-25 ENCOUNTER — Telehealth: Payer: Self-pay | Admitting: *Deleted

## 2024-03-25 ENCOUNTER — Encounter: Payer: Self-pay | Admitting: *Deleted

## 2024-03-25 VITALS — BP 152/90 | HR 71 | Ht 66.0 in | Wt 159.0 lb

## 2024-03-25 DIAGNOSIS — E785 Hyperlipidemia, unspecified: Secondary | ICD-10-CM

## 2024-03-25 DIAGNOSIS — R002 Palpitations: Secondary | ICD-10-CM | POA: Diagnosis not present

## 2024-03-25 DIAGNOSIS — I10 Essential (primary) hypertension: Secondary | ICD-10-CM

## 2024-03-25 DIAGNOSIS — I499 Cardiac arrhythmia, unspecified: Secondary | ICD-10-CM

## 2024-03-25 DIAGNOSIS — I25110 Atherosclerotic heart disease of native coronary artery with unstable angina pectoris: Secondary | ICD-10-CM | POA: Diagnosis not present

## 2024-03-25 MED ORDER — ISOSORBIDE MONONITRATE ER 60 MG PO TB24: 60.0000 mg | ORAL_TABLET | Freq: Every day | ORAL | 6 refills | Status: AC

## 2024-03-25 NOTE — Patient Instructions (Addendum)
 Medication Instructions:   Begin Imdur  60mg  daily  Continue all other medications.     Labwork:  none  Testing/Procedures:  Your physician has recommended that you wear a 30 day event monitor. Event monitors are medical devices that record the heart's electrical activity. Doctors most often us  these monitors to diagnose arrhythmias. Arrhythmias are problems with the speed or rhythm of the heartbeat. The monitor is a small, portable device. You can wear one while you do your normal daily activities. This is usually used to diagnose what is causing palpitations/syncope (passing out). Office will contact with results via phone, letter or mychart.     Follow-Up:  2 months   Any Other Special Instructions Will Be Listed Below (If Applicable).  Please keep BP log x 2 weeks and notify office on readings.   If you need a refill on your cardiac medications before your next appointment, please call your pharmacy.

## 2024-03-30 ENCOUNTER — Ambulatory Visit (INDEPENDENT_AMBULATORY_CARE_PROVIDER_SITE_OTHER)

## 2024-03-30 DIAGNOSIS — E538 Deficiency of other specified B group vitamins: Secondary | ICD-10-CM

## 2024-03-30 MED ORDER — CYANOCOBALAMIN 1000 MCG/ML IJ SOLN
1000.0000 ug | INTRAMUSCULAR | Status: AC
  Administered 2024-03-30 – 2024-10-05 (×7): 1000 ug via INTRAMUSCULAR

## 2024-03-30 NOTE — Progress Notes (Signed)
 Patient is in office today for a nurse visit for B12 Injection. Patient Injection was given in the  Left deltoid. Patient tolerated injection well.

## 2024-04-01 DIAGNOSIS — R002 Palpitations: Secondary | ICD-10-CM | POA: Diagnosis not present

## 2024-04-02 ENCOUNTER — Encounter: Payer: Self-pay | Admitting: Family Medicine

## 2024-04-02 NOTE — Addendum Note (Signed)
 Addended by: Myracle Febres G on: 04/02/2024 11:54 AM   Modules accepted: Orders

## 2024-04-06 ENCOUNTER — Encounter: Payer: Self-pay | Admitting: *Deleted

## 2024-04-08 ENCOUNTER — Other Ambulatory Visit

## 2024-04-10 ENCOUNTER — Encounter: Payer: Self-pay | Admitting: *Deleted

## 2024-04-13 ENCOUNTER — Telehealth: Payer: Self-pay | Admitting: *Deleted

## 2024-04-13 NOTE — Telephone Encounter (Signed)
 Call placed to patient - stated that she is currently wearing the monitor - states she will try to wear it as long as she can.  States uncomfortable having it on.

## 2024-04-13 NOTE — Telephone Encounter (Signed)
 This message is to inform you that the patient has not yet read the following message. (Notification date: April 10, 2024)    30 day monitor  From Jon KANDICE Potters, LPN To [name], Ken and Delivered 04/06/2024  8:13 AM      Good morning -    Wanted to touch base in regards to the monitor that was ordered by Dr. Lavona.     We received fax back that the order was cancelled by you. Reason was listed as:  declined, non-compliance/inconvenience.   Just making sure before I document in your chart - let me know that you did not want to do the monitor & your reason for declining.     Thank you,    Sharman DEL, LPN         [address]  MyChart User Last Read On  [name] Not Read

## 2024-04-16 ENCOUNTER — Other Ambulatory Visit (INDEPENDENT_AMBULATORY_CARE_PROVIDER_SITE_OTHER)

## 2024-04-16 DIAGNOSIS — Z7985 Long-term (current) use of injectable non-insulin antidiabetic drugs: Secondary | ICD-10-CM

## 2024-04-16 DIAGNOSIS — Z794 Long term (current) use of insulin: Secondary | ICD-10-CM

## 2024-04-16 DIAGNOSIS — E119 Type 2 diabetes mellitus without complications: Secondary | ICD-10-CM

## 2024-04-16 NOTE — Progress Notes (Signed)
 04/16/2024 Name: [name] MRN: [id number] DOB: 01-20-1951  Chief Complaint  Patient presents with   Diabetes    [name] is a 73 y.o. year old female who presented for a telephone visit.  I connected with  [name] on 04/16/24 by telephone and verified that I am speaking with the correct person using two identifiers. I discussed the limitations of evaluation and management by telemedicine. The patient expressed understanding and agreed to proceed.  Patient was located in her home and PharmD in PCP office during this visit   They were referred to the pharmacist by their PCP for assistance in managing diabetes and medication access.    Subjective:  Patient reports she is doing okay most days.  She does not have as much energy as she used to.   She has her medications at home (some via patient assistance), but doesn't like taking so much medicine.  She reports libre CGM readings of 200-300.  She will have the occasional low blood sugar (around 69) over night (potential compression low).  She is still enrolled in the novo nordisk patient assistance program (Tresiba /Novolog /Ozempic ).   Care Team: Primary Care Provider: [name], FNP 06/04/24 Cardiologist: Dr. Lavona; Next Scheduled Visit: 06/03/24  Medication Access/Adherence  Current Pharmacy:  [facility] [place] [place] - [address] [place] [address] Phone: [phone] Fax: [phone]  Patient reports affordability concerns with their medications: Yes  Patient reports access/transportation concerns to their pharmacy: No  Patient reports adherence concerns with their medications:  No    Diabetes:  Current medications: Lantus  25-30 units daily; Novolog  0-6 units 3 times daily (often skips doses); Ozempic  0.5mg  weekly--seems to be tolerating okay Medications tried in the past: stopped Ozempic  due to dull stomach pains. Jardiance , metformin    Now Using FSL3 PLUS CGM (uses  reader device;mobile not compatible)  Current glucose readings:  Not able to read out all of the CGM details Date of Download: 04/16/24 % Time CGM is active: n/a% Average Glucose: 203 mg/dL Glucose Management Indicator: n/a  Time in Goal:  - Time in range 70-180: 43% - Time above range: 56% - Time below range: 1% (asymptomatic, potential compression lows) Observed patterns: post prandial and AM hyperglycemia   Current meal patterns:  Reports she only eats 1-2 meals daily Encouraged 2-3 healthy plate meals daily to keep blood sugar stable   Current physical activity: limited    Current medication access support: novo nordisk PAP (Tresiba /Novolog /Ozempic )   Objective:  Lab Results  Component Value Date   HGBA1C 8.0 (H) 02/28/2024    Lab Results  Component Value Date   CREATININE 1.00 02/28/2024   BUN 9 02/28/2024   NA 143 02/28/2024   K 3.3 (L) 02/28/2024   CL 102 02/28/2024   CO2 26 02/28/2024    Lab Results  Component Value Date   CHOL 104 01/14/2024   HDL 42 01/14/2024   LDLCALC 49 01/14/2024   LDLDIRECT 93 04/06/2015   TRIG 67 01/14/2024   CHOLHDL 2.5 01/14/2024    Medications Reviewed Today     Reviewed by [name], [facility] (Pharmacist) on 04/22/24 at 1214  Med List Status: <None>   Medication Order Taking? Sig Documenting Provider Last Dose Status Informant  acetaminophen  (TYLENOL ) 500 MG tablet [id number]  Take 1,000 mg by mouth every 6 (six) hours as needed for mild pain (pain score 1-3) or headache. [provider]  Active Nursing Home  Medication Administration Guide (MAG)  aspirin  81 MG chewable tablet [id number]  Chew 1 tablet (81 mg total) by mouth daily. Davia Nydia POUR, MD  Active Nursing Home Medication Administration Guide (MAG)  Blood Glucose Monitoring Suppl DEVI [id number]  1 each by Does not apply route in the morning, at noon, and at bedtime. May substitute to any manufacturer covered by patient's insurance. [name], FNP   Active Nursing Home Medication Administration Guide (MAG)  clopidogrel  (PLAVIX ) 75 MG tablet [id number]  Take 1 tablet (75 mg total) by mouth daily with breakfast. Pietro Redell RAMAN, MD  Active Nursing Home Medication Administration Guide (MAG)  Continuous Glucose Sensor (FREESTYLE LIBRE 3 PLUS SENSOR) MISC [id number]  Change sensor every 15 days. Apply to back of upper arm. DX: E11.65 [name], FNP  Active   cyanocobalamin  (VITAMIN B12) injection 1,000 mcg [id number]   [name], FNP  Active   cyanocobalamin  (VITAMIN B12) injection 1,000 mcg [id number]   [name], FNP  Active   famotidine  (PEPCID ) 20 MG tablet [id number]  Take 1 tablet (20 mg total) by mouth 2 (two) times daily. [name], FNP  Active   furosemide  (LASIX ) 20 MG tablet [id number]  Take 1 tablet (20 mg total) by mouth daily. [name], FNP  Active Nursing Home Medication Administration Guide (MAG)  insulin  aspart (NOVOLOG  FLEXPEN) 100 UNIT/ML FlexPen [id number]  Inject 0-6 Units into the skin 3 (three) times daily with meals. Inject per sliding scale 0-200 = 0 units, if less than 70 notify provider 201-250 = 2 units 251-300 = 3 units 301-350 = 4 units 351-400 = 5 units 401-450 = 6 units >450 = notify provider Ricky Fines, MD  Active   insulin  degludec (TRESIBA  FLEXTOUCH) 200 UNIT/ML FlexTouch Pen [id number] Yes Inject 40 units subcutaneously daily. [name], FNP  Active     Discontinued 04/22/24 1211 (Patient Preference)   isosorbide  mononitrate (IMDUR ) 60 MG 24 hr tablet [id number]  Take 1 tablet (60 mg total) by mouth daily. Lavona Agent, MD  Active   lisinopril  (ZESTRIL ) 10 MG tablet [id number]  Take 1 tablet (10 mg total) by mouth daily. Lavona Agent, MD  Active Nursing Home Medication Administration Guide (MAG)  loratadine  (CLARITIN ) 10 MG tablet [id number]  Take 10 mg by mouth daily. [provider]  Active Nursing Home Medication Administration Guide (MAG)   metoprolol  tartrate (LOPRESSOR ) 25 MG tablet [id number]  Take 1 tablet (25 mg total) by mouth 2 (two) times daily. Ricky Fines, MD  Active   nitroGLYCERIN  (NITROSTAT ) 0.4 MG SL tablet [id number]  DISSOLVE 1 TABLET UNDER TONGUE FOR CHESTPAIN.MAY REPEAT EVERY 5 MINUTES FOR 3 DOSES.IF NO RELIEF CALL 911 OR GO TO ER [name], FNP  Active   potassium chloride  (KLOR-CON  M) 10 MEQ tablet [id number]  Take 2 tablets (20 mEq total) by mouth 2 (two) times daily. [name], FNP  Active   rOPINIRole  (REQUIP ) 1 MG tablet [id number]  TAKE 1 TABLET BY MOUTH DAILY AS NEEDED [name], FNP  Active   rosuvastatin  (CRESTOR ) 20 MG tablet [id number]  TAKE ONE (1) TABLET BY MOUTH EVERY DAY [name], FNP  Active   Semaglutide ,0.25 or 0.5MG /DOS, (OZEMPIC , 0.25 OR 0.5 MG/DOSE,) 2 MG/3ML SOPN [id number] Yes Inject 0.25 mg into the skin once a week.  Patient taking differently: Inject 0.5 mg into the skin once a week.   [name], FNP  Active   sertraline  (ZOLOFT ) 50 MG tablet  [id number]  Take 1 tablet (50 mg total) by mouth daily. [name], FNP  Active Nursing Home Medication Administration Guide (MAG)  traZODone  (DESYREL ) 50 MG tablet [id number]  Take 1 tablet (50 mg total) by mouth at bedtime as needed for sleep. [name], FNP  Active Nursing Home Medication Administration Guide (MAG)  Med List Note (Ward, Angelica, CPhT 01/14/24 1413): [facility] [phone]            Assessment/Plan:   Diabetes: - Currently uncontrolled--patient does not want to be on multiple medications/injections - Reviewed long term cardiovascular and renal outcomes of uncontrolled blood sugar - Reviewed goal A1c, goal fasting, and goal 2 hour post prandial glucose - Reviewed dietary modifications including FOLLOWING A HEART HEALTHY DIET/HEALTHY PLATE METHOD - Reviewed lifestyle modifications including:  - Recommend to : INCREASED Ozempic  to 0.5mg  weekly (reports she has the red  boxes at home, tolerating okay) CONTINUE Tresiba  (patient injecting anywhere from 3-40 units DAILY) CONTINUE all other medications as prescribed Encouraged compliance of all medications (often skips meal time insulin  due to fear of hypoglycemia, doesn't want to take medicine) - Patient denies personal or family history of multiple endocrine neoplasia type 2, medullary thyroid  cancer; personal history of pancreatitis or gallbladder disease. - Recommend to check glucose using libre FSL3-->3+ - Enrolled in novo nordisk PAP for Tresiba  and Novolog    Follow Up Plan: PCP 06/04/24, Cardiology 06/03/24  Mliss Tarry Griffin, PharmD, BCACP, CPP Clinical Pharmacist, [facility]

## 2024-04-21 ENCOUNTER — Telehealth: Payer: Self-pay | Admitting: Family Medicine

## 2024-04-21 NOTE — Telephone Encounter (Signed)
 I called and spoke with patient and made her aware that there is a box of ULTRA FINE MINI PEN NEEDLES up front in the cabinet for her to pick up. Patient said she would try to come by the office tomorrow to pick them up.

## 2024-04-22 MED ORDER — OZEMPIC (0.25 OR 0.5 MG/DOSE) 2 MG/3ML ~~LOC~~ SOPN: 0.2500 mg | PEN_INJECTOR | SUBCUTANEOUS | Status: DC

## 2024-04-22 MED ORDER — TRESIBA FLEXTOUCH 200 UNIT/ML ~~LOC~~ SOPN: PEN_INJECTOR | SUBCUTANEOUS | Status: DC

## 2024-04-29 ENCOUNTER — Ambulatory Visit: Attending: Cardiology

## 2024-04-29 DIAGNOSIS — I499 Cardiac arrhythmia, unspecified: Secondary | ICD-10-CM | POA: Diagnosis not present

## 2024-05-01 ENCOUNTER — Ambulatory Visit: Admitting: *Deleted

## 2024-05-01 ENCOUNTER — Encounter: Payer: Self-pay | Admitting: Family Medicine

## 2024-05-01 DIAGNOSIS — E538 Deficiency of other specified B group vitamins: Secondary | ICD-10-CM

## 2024-05-01 NOTE — Progress Notes (Signed)
 Patient is in office today for a nurse visit for B12 Injection. Patient Injection was given in the  Right deltoid. Patient tolerated injection well.   Pt wanting to know about B12 shots, that she was taking them weekly and now just once a month. Explained that she was started on the shots in May weekly for 3 weeks and moved to monthly and this was the protocol when we start these shots. She feels that since she has started the monthly shots that she gets dizzy and confused before she gets her next one. Daughter Crystal made a MyChart appt w/ PCP for first available on Mon 05/11/24 w/ concerns of recent fall, almost falling yesterday, very confused, didn't know apt # yesterday. Possible UTI, freq urination, going on herself which is worse at night, not knowing she needs to go, and loose stools. With these concerns I have moved her appt up to next Wed 05/06/24 at 150 pm.

## 2024-05-03 ENCOUNTER — Ambulatory Visit: Payer: Self-pay | Admitting: Cardiology

## 2024-05-05 ENCOUNTER — Other Ambulatory Visit: Payer: Self-pay | Admitting: Family Medicine

## 2024-05-05 DIAGNOSIS — I509 Heart failure, unspecified: Secondary | ICD-10-CM

## 2024-05-05 DIAGNOSIS — F411 Generalized anxiety disorder: Secondary | ICD-10-CM

## 2024-05-05 DIAGNOSIS — F339 Major depressive disorder, recurrent, unspecified: Secondary | ICD-10-CM

## 2024-05-05 DIAGNOSIS — R600 Localized edema: Secondary | ICD-10-CM

## 2024-05-05 DIAGNOSIS — R112 Nausea with vomiting, unspecified: Secondary | ICD-10-CM

## 2024-05-06 ENCOUNTER — Ambulatory Visit: Payer: Self-pay | Admitting: Family Medicine

## 2024-05-06 ENCOUNTER — Encounter: Payer: Self-pay | Admitting: Family Medicine

## 2024-05-06 ENCOUNTER — Ambulatory Visit (INDEPENDENT_AMBULATORY_CARE_PROVIDER_SITE_OTHER): Admitting: Family Medicine

## 2024-05-06 VITALS — BP 178/78 | HR 91 | Temp 98.9°F | Ht 66.0 in | Wt 161.0 lb

## 2024-05-06 DIAGNOSIS — E538 Deficiency of other specified B group vitamins: Secondary | ICD-10-CM | POA: Diagnosis not present

## 2024-05-06 DIAGNOSIS — Z794 Long term (current) use of insulin: Secondary | ICD-10-CM | POA: Diagnosis not present

## 2024-05-06 DIAGNOSIS — R42 Dizziness and giddiness: Secondary | ICD-10-CM | POA: Diagnosis not present

## 2024-05-06 DIAGNOSIS — E1159 Type 2 diabetes mellitus with other circulatory complications: Secondary | ICD-10-CM

## 2024-05-06 DIAGNOSIS — Z7409 Other reduced mobility: Secondary | ICD-10-CM

## 2024-05-06 DIAGNOSIS — F339 Major depressive disorder, recurrent, unspecified: Secondary | ICD-10-CM

## 2024-05-06 DIAGNOSIS — G4701 Insomnia due to medical condition: Secondary | ICD-10-CM

## 2024-05-06 DIAGNOSIS — E1165 Type 2 diabetes mellitus with hyperglycemia: Secondary | ICD-10-CM

## 2024-05-06 DIAGNOSIS — R410 Disorientation, unspecified: Secondary | ICD-10-CM | POA: Diagnosis not present

## 2024-05-06 DIAGNOSIS — R32 Unspecified urinary incontinence: Secondary | ICD-10-CM | POA: Diagnosis not present

## 2024-05-06 DIAGNOSIS — R296 Repeated falls: Secondary | ICD-10-CM

## 2024-05-06 DIAGNOSIS — F411 Generalized anxiety disorder: Secondary | ICD-10-CM | POA: Diagnosis not present

## 2024-05-06 DIAGNOSIS — B379 Candidiasis, unspecified: Secondary | ICD-10-CM

## 2024-05-06 LAB — URINALYSIS, ROUTINE W REFLEX MICROSCOPIC
Bilirubin, UA: NEGATIVE
Ketones, UA: NEGATIVE
Leukocytes,UA: NEGATIVE
Nitrite, UA: NEGATIVE
Specific Gravity, UA: 1.015 (ref 1.005–1.030)
Urobilinogen, Ur: 2 mg/dL — ABNORMAL HIGH (ref 0.2–1.0)
pH, UA: 6 (ref 5.0–7.5)

## 2024-05-06 LAB — MICROSCOPIC EXAMINATION
Renal Epithel, UA: NONE SEEN /HPF
WBC, UA: NONE SEEN /HPF (ref 0–5)

## 2024-05-06 MED ORDER — SERTRALINE HCL 50 MG PO TABS: 75.0000 mg | ORAL_TABLET | Freq: Every day | ORAL | 3 refills | Status: AC

## 2024-05-06 MED ORDER — TRAZODONE HCL 50 MG PO TABS: 50.0000 mg | ORAL_TABLET | Freq: Every evening | ORAL | 3 refills | Status: AC | PRN

## 2024-05-06 MED ORDER — CLOTRIMAZOLE 1 % VA CREA: 1.0000 | TOPICAL_CREAM | Freq: Every day | VAGINAL | 0 refills | Status: AC

## 2024-05-06 NOTE — Progress Notes (Signed)
 Acute Office Visit  Subjective:     Patient ID: [name], female    DOB: July 17, 1951, 73 y.o.   MRN: [id number]  Chief Complaint  Patient presents with   Medical Management of Chronic Issues    Confusion X 6-9 months, worse over last 3 weeks incontinence    HPI  Discussed the use of AI scribe software for clinical note transcription with the patient, who gave verbal consent to proceed.  History of Present Illness   [name] is a 73 year old female who presents with episodes of confusion and weakness. She is accompanied by her daughter-in-law, [name] in person, and her daughter, [name] who is on audio via phone  Cognitive impairment and confusion - Episodes of confusion for the past 6-9 months, worsening over the last 3 weeks - Increased severity of confusion in the evening - Difficulty remembering her apartment number - Frequent repetition of statements - Tendency to discuss unrelated topics  Gait disturbance and falls - At least five falls since clavicle fracture - Persistent leg weakness described as 'spongy', present consistently and not limited to standing - Recent completion of physical therapy months ago without improvement - Balance issues with unsteadiness and staggering while walking - No seizure like activity - Did not go to neuro for evaluation  Dizziness and lightheadedness - Frequent dizziness and lightheadedness - Symptoms improve with B12 injections for about 1 week then returns, last administered last week - No focal weakness - Has had CT of head without significant findings  Urinary incontinence - Urinary incontinence present for the past couple of weeks - No dysuria, frequency, flank pain, or fever - History of urinary tract infections, last urine culture in May 2024  Hyperglycemia and antidiabetic medication intolerance - Elevated blood glucose, most recent reading 272 - Not using Ozempic  due to nausea and vomiting last week - History of  potassium imbalance with frequent vomiting - Interested in pump. She has not follow up with endocrinology.   Mood disturbance - Significant anxiety and depression symptoms - Symptoms worsened by recent loss of car and independence - Currently taking sertraline  (Zoloft ) and trazodone  - Denies SI  Hypertension - BP is up and down at home - Denies chest pain, shortness of breath, edema          05/06/2024    2:15 PM 01/31/2024    2:30 PM 12/18/2017    3:05 PM  MMSE - Mini Mental State Exam  Orientation to time 5 5 5   Orientation to Place 5 5 5   Registration 3 3 3   Attention/ Calculation 0 5 4  Recall 3 2 2   Language- name 2 objects 2 2 2   Language- repeat 1 1 1   Language- follow 3 step command 3 3 3   Language- read & follow direction 1 1 1   Write a sentence 1 1 0  Copy design 0 0 1  Total score 24 28 27       05/06/2024    2:06 PM 02/28/2024    9:10 AM 01/31/2024    2:35 PM  Depression screen PHQ 2/9  Decreased Interest 3 0 0  Down, Depressed, Hopeless 3 0 0  PHQ - 2 Score 6 0 0  Altered sleeping 3 0 0  Tired, decreased energy 3 3 0  Change in appetite 3 3 0  Feeling bad or failure about yourself  3 0 0  Trouble concentrating 3 0 0  Moving slowly or fidgety/restless 3 0 0  Suicidal thoughts 0 0 0  PHQ-9 Score 24 6 0  Difficult doing work/chores  Very difficult Not difficult at all      05/06/2024    2:17 PM 05/06/2024    2:06 PM 02/28/2024    9:11 AM 01/31/2024    2:35 PM  GAD 7 : Generalized Anxiety Score  Nervous, Anxious, on Edge 3 3 3  0  Control/stop worrying 3 3 3  0  Worry too much - different things 3 3 3  0  Trouble relaxing 3 3 1  0  Restless 3 3 1  0  Easily annoyed or irritable 3 3 2  0  Afraid - awful might happen 3 3 3  0  Total GAD 7 Score 21 21 16  0  Anxiety Difficulty Very difficult Very difficult Very difficult Not difficult at all      ROS As per HPI.      Objective:    BP (!) 178/78   Pulse 91   Temp 98.9 F (37.2 C)   Ht 5' 6 (1.676 m)    Wt 161 lb (73 kg)   SpO2 96%   BMI 25.99 kg/m  BP Readings from Last 3 Encounters:  05/06/24 (!) 178/78  03/25/24 (!) 152/90  02/28/24 138/77      Physical Exam Vitals and nursing note reviewed.  Constitutional:      General: She is not in acute distress.    Appearance: She is not ill-appearing, toxic-appearing or diaphoretic.  HENT:     Nose: Nose normal.     Mouth/Throat:     Mouth: Mucous membranes are moist.     Pharynx: Oropharynx is clear.  Eyes:     General:        Right eye: No discharge.        Left eye: No discharge.     Extraocular Movements: Extraocular movements intact.     Conjunctiva/sclera: Conjunctivae normal.     Pupils: Pupils are equal, round, and reactive to light.  Cardiovascular:     Rate and Rhythm: Normal rate and regular rhythm.     Heart sounds: Normal heart sounds. No murmur heard. Pulmonary:     Effort: Pulmonary effort is normal. No respiratory distress.     Breath sounds: Normal breath sounds. No wheezing, rhonchi or rales.  Chest:     Chest wall: No tenderness.  Musculoskeletal:     Cervical back: Neck supple. No rigidity.     Right lower leg: No edema.     Left lower leg: No edema.  Skin:    General: Skin is warm and dry.  Neurological:     Mental Status: She is alert and oriented to person, place, and time. Mental status is at baseline.     Cranial Nerves: No cranial nerve deficit.     Sensory: No sensory deficit.     Motor: No weakness.     Gait: Gait normal.  Psychiatric:        Mood and Affect: Mood normal.        Behavior: Behavior normal.     Results for orders placed or performed in visit on 05/06/24  Microscopic Examination   Urine  Result Value Ref Range   WBC, UA None seen 0 - 5 /hpf   RBC, Urine 0-2 0 - 2 /hpf   Epithelial Cells (non renal) 0-10 0 - 10 /hpf   Renal Epithel, UA None seen None seen /hpf   Bacteria, UA Few (A) None seen/Few   Yeast, UA Present (  A) None seen  Urinalysis, Routine w reflex  microscopic  Result Value Ref Range   Specific Gravity, UA 1.015 1.005 - 1.030   pH, UA 6.0 5.0 - 7.5   Color, UA Yellow Yellow   Appearance Ur Clear Clear   Leukocytes,UA Negative Negative   Protein,UA 1+ (A) Negative/Trace   Glucose, UA 3+ (A) Negative   Ketones, UA Negative Negative   RBC, UA 2+ (A) Negative   Bilirubin, UA Negative Negative   Urobilinogen, Ur 2.0 (H) 0.2 - 1.0 mg/dL   Nitrite, UA Negative Negative   Microscopic Examination See below:         Assessment & Plan:   [name] was seen today for medical management of chronic issues.  Diagnoses and all orders for this visit:  Intermittent confusion -     Ambulatory referral to Home Health  Dizziness -     CBC with Differential/Platelet -     BMP8+EGFR -     Vitamin B12 -     TSH  Recurrent falls -     Ambulatory referral to Home Health  Impaired functional mobility, balance, gait, and endurance -     Ambulatory referral to Home Health  Urinary incontinence, unspecified type -     Urinalysis, Routine w reflex microscopic -     Urine Culture -     Microscopic Examination  B12 deficiency -     Vitamin B12  Type 2 diabetes mellitus with hyperglycemia, with long-term current use of insulin  (HCC) -     CBC with Differential/Platelet -     Ambulatory referral to Endocrinology  Insomnia due to medical condition -     traZODone  (DESYREL ) 50 MG tablet; Take 1 tablet (50 mg total) by mouth at bedtime as needed for sleep. -     TSH  Generalized anxiety disorder -     sertraline  (ZOLOFT ) 50 MG tablet; Take 1.5 tablets (75 mg total) by mouth daily.  Depression, recurrent (HCC) -     sertraline  (ZOLOFT ) 50 MG tablet; Take 1.5 tablets (75 mg total) by mouth daily.  Hypertension associated with diabetes (HCC)    Normal MMSE today. No acute findings on neuro exam. Referral for [facility] PT to assistant with endurance and strength. Will check labs as above. Contact information provided to patient and daughter to  schedule neurology appointment for further evaluation of dizziness, confusion. She has been compliant with monthly B12 injections. Reviewed recent long term heart monitor results that were unremarkable. New referral to endocrinology placed today. ? Intermittent due to uncontrolled hyperglycemia. Urinary incontinence may also be related to uncontrolled hyperglycemia. Urine culture is pending. Discussed compliance with insulin  regimen. BP is uncontrolled today. Monitor at home and notify for elevated reading so adjustments can be made if so. Clotrimazole  sent in yeast. Diflucan  contraindicated due to QT prolongation. Anxiety and depression are uncontrolled. Denies SI. Increase zoloft  to 75 mg daily. Labs pending as above. Keep scheduled chronic follow up appt, sooner for new or worsening symptoms.   The patient indicates understanding of these issues and agrees with the plan.  Total time spent caring for the patient today was 48 minutes. This includes time spent before the visit reviewing the chart, time spent during the visit, and time spent after the visit on documentation.  Karen CHRISTELLA Search, FNP

## 2024-05-07 ENCOUNTER — Telehealth: Payer: Self-pay | Admitting: Family Medicine

## 2024-05-07 ENCOUNTER — Encounter: Payer: Self-pay | Admitting: Family Medicine

## 2024-05-07 LAB — CBC WITH DIFFERENTIAL/PLATELET
Basophils Absolute: 0 x10E3/uL (ref 0.0–0.2)
Basos: 0 %
EOS (ABSOLUTE): 0.1 x10E3/uL (ref 0.0–0.4)
Eos: 1 %
Hematocrit: 35.4 % (ref 34.0–46.6)
Hemoglobin: 11.4 g/dL (ref 11.1–15.9)
Immature Grans (Abs): 0 x10E3/uL (ref 0.0–0.1)
Immature Granulocytes: 0 %
Lymphocytes Absolute: 1.8 x10E3/uL (ref 0.7–3.1)
Lymphs: 22 %
MCH: 29.2 pg (ref 26.6–33.0)
MCHC: 32.2 g/dL (ref 31.5–35.7)
MCV: 91 fL (ref 79–97)
Monocytes Absolute: 0.3 x10E3/uL (ref 0.1–0.9)
Monocytes: 4 %
Neutrophils Absolute: 5.8 x10E3/uL (ref 1.4–7.0)
Neutrophils: 73 %
Platelets: 146 x10E3/uL — ABNORMAL LOW (ref 150–450)
RBC: 3.91 x10E6/uL (ref 3.77–5.28)
RDW: 13.1 % (ref 11.7–15.4)
WBC: 7.9 x10E3/uL (ref 3.4–10.8)

## 2024-05-07 LAB — BMP8+EGFR
BUN/Creatinine Ratio: 8 — ABNORMAL LOW (ref 12–28)
BUN: 8 mg/dL (ref 8–27)
CO2: 26 mmol/L (ref 20–29)
Calcium: 8.9 mg/dL (ref 8.7–10.3)
Chloride: 98 mmol/L (ref 96–106)
Creatinine, Ser: 1 mg/dL (ref 0.57–1.00)
Glucose: 281 mg/dL — ABNORMAL HIGH (ref 70–99)
Potassium: 4 mmol/L (ref 3.5–5.2)
Sodium: 138 mmol/L (ref 134–144)
eGFR: 59 mL/min/1.73 — ABNORMAL LOW (ref >59)

## 2024-05-07 LAB — TSH: TSH: 1.78 u[IU]/mL (ref 0.450–4.500)

## 2024-05-07 LAB — VITAMIN B12: Vitamin B-12: 1130 pg/mL (ref 232–1245)

## 2024-05-07 NOTE — Telephone Encounter (Signed)
 Copied from CRM #[id number]. Topic: Clinical - Prescription Issue >> May 07, 2024  1:39 PM Zebedee SAUNDERS wrote: Reason for CRM: Pt called stated pharmacy need new script with dosage for metoprolol  tartrate (LOPRESSOR ) 25 MG tablet.[facility] - [address] [place] [address] Phone: [phone] Fax: [phone]

## 2024-05-07 NOTE — Telephone Encounter (Signed)
 This is a duplicate from a mychart message

## 2024-05-08 ENCOUNTER — Ambulatory Visit: Payer: Self-pay

## 2024-05-08 ENCOUNTER — Telehealth: Payer: Self-pay

## 2024-05-08 LAB — URINE CULTURE

## 2024-05-08 MED ORDER — METOPROLOL TARTRATE 25 MG PO TABS: 25.0000 mg | ORAL_TABLET | Freq: Two times a day (BID) | ORAL | 3 refills | Status: DC

## 2024-05-08 NOTE — Telephone Encounter (Signed)
 This has been addressed in her results notes, will close encounter.

## 2024-05-08 NOTE — Telephone Encounter (Signed)
 FYI Only or Action Required?: Action required by provider: medication refill request.  Patient was last seen in primary care on 05/06/2024 by [name], FNP.  Called Nurse Triage reporting Hypertension.  Symptoms began several days ago.  Interventions attempted: Nothing.  Symptoms are: unchanged.  Triage Disposition: Urgent Home Treatment With Follow-up Call  Patient/caregiver understands and will follow disposition?: Yes     Copied from CRM #[id number]. Topic: Clinical - Red Word Triage >> May 08, 2024 11:25 AM Nathanel BROCKS wrote: Red Word that prompted transfer to Nurse Triage:   Pt called and bp 180/92 she is without her medication. Please advise.  ----------------------------------------------------------------------- From previous Reason for Contact - Medication Question: Reason for CRM: pt called back Reason for Disposition  [1] Systolic BP >= 180 OR Diastolic >= 110 AND [2] missed most recent dose of blood pressure medication  Answer Assessment - Initial Assessment Questions Patient reports only took Zofran  and Lasix  this morning, have not take other cardiac bp meds. Advised patient to take prescribed blood pressure and cardiac medications now. Advised to recheck BP, call back if not improved. Informed patient will send RX request to provider. Educated patient to call 911 chest pain, shortness of breath, numbness/ weakness and severe headache occurs. Patient verbalized understanding.  1. BLOOD PRESSURE: What is your blood pressure? Did you take at least two measurements 5 minutes apart?     180/92 1 hour ago,  2. ONSET: When did you take your blood pressure?     today 3. HOW: How did you take your blood pressure? (e.g., automatic home BP monitor, visiting nurse)     Three times daily,  4. HISTORY: Do you have a history of high blood pressure?     yes 5. MEDICINES: Are you taking any medicines for blood pressure? Have you missed any doses recently?      Metrotol 25mg - 2 tabs morning, 2 tabs evening, last took 7 PM last night. Have not missed any doses other than this morning, states blood preussrue was high in the clinic; no new meds. 6. OTHER SYMPTOMS: Do you have any symptoms? (e.g., blurred vision, chest pain, difficulty breathing, headache, weakness)     Headache; 3/10, staggering; get up and legs give away, already seen for it, no more than uuals blurreved vision  Protocols used: Blood Pressure - High-A-AH

## 2024-05-08 NOTE — Telephone Encounter (Signed)
 This has already been addressed in another encounter, will close this one.

## 2024-05-08 NOTE — Telephone Encounter (Signed)
 Copied from CRM [id number]. Topic: Clinical - Prescription Issue >> May 08, 2024  9:11 AM Diannia H wrote: Reason for CRM: Patient called in because she is needing the provider to contact the pharmacy and let them know that its okay to fill her rx and the pharmacy is needing to know what dosage she is suppose to take for metoprolol  tartrate (LOPRESSOR ) 25 MG tablet. Please contact [place] at [facility].

## 2024-05-11 ENCOUNTER — Ambulatory Visit: Admitting: Family Medicine

## 2024-05-17 ENCOUNTER — Encounter (HOSPITAL_COMMUNITY): Payer: Self-pay | Admitting: Emergency Medicine

## 2024-05-17 ENCOUNTER — Inpatient Hospital Stay (HOSPITAL_COMMUNITY)
Admission: EM | Admit: 2024-05-17 | Discharge: 2024-05-22 | DRG: 100 | Disposition: A | Discharge status: Home / Self Care | Attending: Internal Medicine | Admitting: Internal Medicine

## 2024-05-17 ENCOUNTER — Other Ambulatory Visit: Payer: Self-pay

## 2024-05-17 ENCOUNTER — Emergency Department (HOSPITAL_COMMUNITY)

## 2024-05-17 DIAGNOSIS — E1151 Type 2 diabetes mellitus with diabetic peripheral angiopathy without gangrene: Secondary | ICD-10-CM | POA: Diagnosis present

## 2024-05-17 DIAGNOSIS — I130 Hypertensive heart and chronic kidney disease with heart failure and stage 1 through stage 4 chronic kidney disease, or unspecified chronic kidney disease: Secondary | ICD-10-CM | POA: Diagnosis not present

## 2024-05-17 DIAGNOSIS — R4689 Other symptoms and signs involving appearance and behavior: Secondary | ICD-10-CM

## 2024-05-17 DIAGNOSIS — F05 Delirium due to known physiological condition: Secondary | ICD-10-CM | POA: Diagnosis not present

## 2024-05-17 DIAGNOSIS — E538 Deficiency of other specified B group vitamins: Secondary | ICD-10-CM | POA: Diagnosis present

## 2024-05-17 DIAGNOSIS — G40109 Localization-related (focal) (partial) symptomatic epilepsy and epileptic syndromes with simple partial seizures, not intractable, without status epilepticus: Secondary | ICD-10-CM | POA: Diagnosis not present

## 2024-05-17 DIAGNOSIS — R296 Repeated falls: Secondary | ICD-10-CM | POA: Diagnosis present

## 2024-05-17 DIAGNOSIS — E8809 Other disorders of plasma-protein metabolism, not elsewhere classified: Secondary | ICD-10-CM | POA: Diagnosis present

## 2024-05-17 DIAGNOSIS — F32A Depression, unspecified: Secondary | ICD-10-CM | POA: Diagnosis present

## 2024-05-17 DIAGNOSIS — E46 Unspecified protein-calorie malnutrition: Secondary | ICD-10-CM | POA: Diagnosis not present

## 2024-05-17 DIAGNOSIS — E1122 Type 2 diabetes mellitus with diabetic chronic kidney disease: Secondary | ICD-10-CM | POA: Diagnosis not present

## 2024-05-17 DIAGNOSIS — E1100 Type 2 diabetes mellitus with hyperosmolarity without nonketotic hyperglycemic-hyperosmolar coma (NKHHC): Secondary | ICD-10-CM | POA: Diagnosis not present

## 2024-05-17 DIAGNOSIS — Z951 Presence of aortocoronary bypass graft: Secondary | ICD-10-CM | POA: Diagnosis not present

## 2024-05-17 DIAGNOSIS — I25110 Atherosclerotic heart disease of native coronary artery with unstable angina pectoris: Secondary | ICD-10-CM | POA: Diagnosis present

## 2024-05-17 DIAGNOSIS — D631 Anemia in chronic kidney disease: Secondary | ICD-10-CM | POA: Diagnosis not present

## 2024-05-17 DIAGNOSIS — Z955 Presence of coronary angioplasty implant and graft: Secondary | ICD-10-CM

## 2024-05-17 DIAGNOSIS — I2510 Atherosclerotic heart disease of native coronary artery without angina pectoris: Secondary | ICD-10-CM | POA: Diagnosis not present

## 2024-05-17 DIAGNOSIS — R29818 Other symptoms and signs involving the nervous system: Secondary | ICD-10-CM | POA: Diagnosis not present

## 2024-05-17 DIAGNOSIS — I509 Heart failure, unspecified: Secondary | ICD-10-CM | POA: Diagnosis present

## 2024-05-17 DIAGNOSIS — E1165 Type 2 diabetes mellitus with hyperglycemia: Secondary | ICD-10-CM | POA: Diagnosis present

## 2024-05-17 DIAGNOSIS — E441 Mild protein-calorie malnutrition: Secondary | ICD-10-CM | POA: Diagnosis not present

## 2024-05-17 DIAGNOSIS — I160 Hypertensive urgency: Secondary | ICD-10-CM | POA: Diagnosis present

## 2024-05-17 DIAGNOSIS — Z6825 Body mass index (BMI) 25.0-25.9, adult: Secondary | ICD-10-CM

## 2024-05-17 DIAGNOSIS — Z800 Family history of malignant neoplasm of digestive organs: Secondary | ICD-10-CM

## 2024-05-17 DIAGNOSIS — E782 Mixed hyperlipidemia: Secondary | ICD-10-CM | POA: Diagnosis not present

## 2024-05-17 DIAGNOSIS — K219 Gastro-esophageal reflux disease without esophagitis: Secondary | ICD-10-CM | POA: Diagnosis not present

## 2024-05-17 DIAGNOSIS — G9341 Metabolic encephalopathy: Secondary | ICD-10-CM | POA: Diagnosis not present

## 2024-05-17 DIAGNOSIS — Z833 Family history of diabetes mellitus: Secondary | ICD-10-CM

## 2024-05-17 DIAGNOSIS — I10 Essential (primary) hypertension: Secondary | ICD-10-CM | POA: Diagnosis not present

## 2024-05-17 DIAGNOSIS — N1831 Chronic kidney disease, stage 3a: Secondary | ICD-10-CM | POA: Diagnosis not present

## 2024-05-17 DIAGNOSIS — Z91119 Patient's noncompliance with dietary regimen due to unspecified reason: Secondary | ICD-10-CM

## 2024-05-17 DIAGNOSIS — R4781 Slurred speech: Secondary | ICD-10-CM | POA: Diagnosis present

## 2024-05-17 DIAGNOSIS — E1159 Type 2 diabetes mellitus with other circulatory complications: Secondary | ICD-10-CM | POA: Diagnosis present

## 2024-05-17 DIAGNOSIS — Z794 Long term (current) use of insulin: Secondary | ICD-10-CM

## 2024-05-17 DIAGNOSIS — I672 Cerebral atherosclerosis: Secondary | ICD-10-CM | POA: Diagnosis not present

## 2024-05-17 DIAGNOSIS — I951 Orthostatic hypotension: Secondary | ICD-10-CM | POA: Diagnosis not present

## 2024-05-17 DIAGNOSIS — Z91041 Radiographic dye allergy status: Secondary | ICD-10-CM

## 2024-05-17 DIAGNOSIS — R569 Unspecified convulsions: Principal | ICD-10-CM

## 2024-05-17 DIAGNOSIS — Z7902 Long term (current) use of antithrombotics/antiplatelets: Secondary | ICD-10-CM

## 2024-05-17 DIAGNOSIS — Z8249 Family history of ischemic heart disease and other diseases of the circulatory system: Secondary | ICD-10-CM

## 2024-05-17 DIAGNOSIS — Z885 Allergy status to narcotic agent status: Secondary | ICD-10-CM

## 2024-05-17 DIAGNOSIS — Z888 Allergy status to other drugs, medicaments and biological substances status: Secondary | ICD-10-CM

## 2024-05-17 DIAGNOSIS — Z8673 Personal history of transient ischemic attack (TIA), and cerebral infarction without residual deficits: Secondary | ICD-10-CM

## 2024-05-17 DIAGNOSIS — Z9049 Acquired absence of other specified parts of digestive tract: Secondary | ICD-10-CM

## 2024-05-17 DIAGNOSIS — Z79899 Other long term (current) drug therapy: Secondary | ICD-10-CM

## 2024-05-17 DIAGNOSIS — G40209 Localization-related (focal) (partial) symptomatic epilepsy and epileptic syndromes with complex partial seizures, not intractable, without status epilepticus: Secondary | ICD-10-CM | POA: Diagnosis not present

## 2024-05-17 DIAGNOSIS — Z7982 Long term (current) use of aspirin: Secondary | ICD-10-CM

## 2024-05-17 DIAGNOSIS — Z8049 Family history of malignant neoplasm of other genital organs: Secondary | ICD-10-CM

## 2024-05-17 LAB — CBG MONITORING, ED
Glucose-Capillary: 425 mg/dL — ABNORMAL HIGH (ref 70–99)
Glucose-Capillary: 304 mg/dL — ABNORMAL HIGH (ref 70–99)

## 2024-05-17 LAB — COMPREHENSIVE METABOLIC PANEL WITH GFR
ALT: 10 U/L (ref 0–44)
AST: 13 U/L — ABNORMAL LOW (ref 15–41)
Albumin: 3.2 g/dL — ABNORMAL LOW (ref 3.5–5.0)
Alkaline Phosphatase: 74 U/L (ref 38–126)
Anion gap: 12 (ref 5–15)
BUN: 9 mg/dL (ref 8–23)
CO2: 22 mmol/L (ref 22–32)
Calcium: 8.9 mg/dL (ref 8.9–10.3)
Chloride: 97 mmol/L — ABNORMAL LOW (ref 98–111)
Creatinine, Ser: 1.16 mg/dL — ABNORMAL HIGH (ref 0.44–1.00)
GFR, Estimated: 49 mL/min — ABNORMAL LOW (ref >60)
Glucose, Bld: 489 mg/dL — ABNORMAL HIGH (ref 70–99)
Potassium: 3.9 mmol/L (ref 3.5–5.1)
Sodium: 131 mmol/L — ABNORMAL LOW (ref 135–145)
Total Bilirubin: 0.3 mg/dL (ref 0.0–1.2)
Total Protein: 6.7 g/dL (ref 6.5–8.1)

## 2024-05-17 LAB — CBC WITH DIFFERENTIAL/PLATELET
Abs Immature Granulocytes: 0.02 K/uL (ref 0.00–0.07)
Basophils Absolute: 0 K/uL (ref 0.0–0.1)
Basophils Relative: 0 %
Eosinophils Absolute: 0.1 K/uL (ref 0.0–0.5)
Eosinophils Relative: 2 %
HCT: 31 % — ABNORMAL LOW (ref 36.0–46.0)
Hemoglobin: 10.5 g/dL — ABNORMAL LOW (ref 12.0–15.0)
Immature Granulocytes: 0 %
Lymphocytes Relative: 29 %
Lymphs Abs: 1.9 K/uL (ref 0.7–4.0)
MCH: 29.9 pg (ref 26.0–34.0)
MCHC: 33.9 g/dL (ref 30.0–36.0)
MCV: 88.3 fL (ref 80.0–100.0)
Monocytes Absolute: 0.3 K/uL (ref 0.1–1.0)
Monocytes Relative: 5 %
Neutro Abs: 4.3 K/uL (ref 1.7–7.7)
Neutrophils Relative %: 64 %
Platelets: 161 K/uL (ref 150–400)
RBC: 3.51 MIL/uL — ABNORMAL LOW (ref 3.87–5.11)
RDW: 13.4 % (ref 11.5–15.5)
WBC: 6.7 K/uL (ref 4.0–10.5)
nRBC: 0 % (ref 0.0–0.2)

## 2024-05-17 MED ORDER — HYDRALAZINE HCL 20 MG/ML IJ SOLN
10.0000 mg | Freq: Four times a day (QID) | INTRAMUSCULAR | Status: DC | PRN
  Administered 2024-05-18 – 2024-05-19 (×2): 10 mg via INTRAVENOUS
  Filled 2024-05-17: qty 1

## 2024-05-17 MED ORDER — LACTATED RINGERS IV BOLUS
1000.0000 mL | Freq: Once | INTRAVENOUS | Status: AC
  Administered 2024-05-17: 1000 mL via INTRAVENOUS

## 2024-05-17 MED ORDER — METOPROLOL TARTRATE 25 MG PO TABS: 25.0000 mg | ORAL_TABLET | Freq: Two times a day (BID) | ORAL | Status: DC

## 2024-05-17 MED ORDER — LABETALOL HCL 5 MG/ML IV SOLN
5.0000 mg | Freq: Once | INTRAVENOUS | Status: AC
  Administered 2024-05-17: 5 mg via INTRAVENOUS
  Filled 2024-05-17: qty 4

## 2024-05-17 MED ORDER — INSULIN ASPART 100 UNIT/ML IJ SOLN
0.0000 [IU] | Freq: Every day | INTRAMUSCULAR | Status: DC
  Administered 2024-05-17: 5 [IU] via SUBCUTANEOUS
  Administered 2024-05-18: 3 [IU] via SUBCUTANEOUS
  Administered 2024-05-21: 2 [IU] via SUBCUTANEOUS
  Filled 2024-05-17: qty 1

## 2024-05-17 MED ORDER — GLUCERNA SHAKE PO LIQD
237.0000 mL | Freq: Three times a day (TID) | ORAL | Status: DC
  Administered 2024-05-18 – 2024-05-22 (×10): 237 mL via ORAL
  Filled 2024-05-17 (×8): qty 237

## 2024-05-17 MED ORDER — POTASSIUM CHLORIDE 10 MEQ/100ML IV SOLN
10.0000 meq | Freq: Once | INTRAVENOUS | Status: AC
  Administered 2024-05-17: 10 meq via INTRAVENOUS
  Filled 2024-05-17: qty 100

## 2024-05-17 MED ORDER — ACETAMINOPHEN 325 MG PO TABS
650.0000 mg | ORAL_TABLET | Freq: Four times a day (QID) | ORAL | Status: DC | PRN
Start: 2024-05-17 — End: 2024-05-22
  Administered 2024-05-21: 650 mg via ORAL
  Filled 2024-05-17: qty 2

## 2024-05-17 MED ORDER — PROCHLORPERAZINE EDISYLATE 10 MG/2ML IJ SOLN: 10.0000 mg | Freq: Four times a day (QID) | INTRAMUSCULAR | Status: DC | PRN

## 2024-05-17 MED ORDER — ENOXAPARIN SODIUM 40 MG/0.4ML IJ SOSY
40.0000 mg | PREFILLED_SYRINGE | INTRAMUSCULAR | Status: DC
  Administered 2024-05-17 – 2024-05-21 (×5): 40 mg via SUBCUTANEOUS
  Filled 2024-05-17 (×5): qty 0.4

## 2024-05-17 MED ORDER — LEVETIRACETAM (KEPPRA) 500 MG/5 ML ADULT IV PUSH
2000.0000 mg | Freq: Once | INTRAVENOUS | Status: AC
  Administered 2024-05-17: 2000 mg via INTRAVENOUS
  Filled 2024-05-17: qty 20

## 2024-05-17 MED ORDER — LISINOPRIL 10 MG PO TABS: 10.0000 mg | ORAL_TABLET | Freq: Every day | ORAL | Status: DC

## 2024-05-17 MED ORDER — ACETAMINOPHEN 650 MG RE SUPP: 650.0000 mg | Freq: Four times a day (QID) | RECTAL | Status: DC | PRN

## 2024-05-17 MED ORDER — INSULIN ASPART 100 UNIT/ML IJ SOLN
0.0000 [IU] | Freq: Three times a day (TID) | INTRAMUSCULAR | Status: DC
  Administered 2024-05-18: 3 [IU] via SUBCUTANEOUS
  Administered 2024-05-18: 7 [IU] via SUBCUTANEOUS
  Administered 2024-05-18 – 2024-05-19 (×2): 3 [IU] via SUBCUTANEOUS
  Administered 2024-05-19: 5 [IU] via SUBCUTANEOUS
  Administered 2024-05-19: 3 [IU] via SUBCUTANEOUS
  Administered 2024-05-20: 5 [IU] via SUBCUTANEOUS
  Administered 2024-05-20: 2 [IU] via SUBCUTANEOUS
  Filled 2024-05-17 (×2): qty 1

## 2024-05-17 NOTE — Plan of Care (Signed)
 Concern for intermittent confusion and slurred speech afterwards. EDP witnessed episode with facial twitching and lip smacking, said looked convincing for seizure. Will recommend transfer to [facility] for neurology consult, and LTM EEG to spell capture. Neurology will see when at [facility].  DELENA Lav MD On call neurologist

## 2024-05-17 NOTE — Consult Note (Incomplete)
 PMH: ?TIA MS? Migraines HTN, CAD, MI, HF, HLD, DM   Concern for intermittent confusion and slurred speech afterwards. EDP witnessed episode with facial twitching and lip smacking, said looked convincing for seizure. Will recommend transfer to [facility] for neurology consult, and LTM EEG to spell capture. Neurology will see when at [facility]  MRI 12/2023 atrophy, for HA Prior was 11/2023  [facility] 12/2023 for HA, seizure A week prior for HA, trauma, falls  [facility] 11/2023 for HA, dizzy, L facial droop  MRI 06/2023 for head trauma, dizzy spells, full body weakness   EEG 01/14/24 , neg For fall, confusion, talking off the wall  09/02/15 Words were jibberish L arm and leg numb   Keppra  2g loaded Hx ong QT   ---- Keeps falling... from sz?

## 2024-05-17 NOTE — ED Provider Notes (Signed)
 [name] EMERGENCY DEPARTMENT AT [facility] Provider Note  CSN: [id number] Arrival date & time: 05/17/24 8177  Chief Complaint(s) Hypertension  HPI [name] is a 73 y.o. female with PMH HTN, HLD, T2DM, TIA, CAD status post CABG, CHF who presents emerged part for evaluation of hypertension and slurred speech.  EMS states that they received a call after family spoke to the patient this evening and noted her to have slurred speech.  On EMS arrival this appears to have resolved but they did find that her systolic blood pressure was greater than 200 and brought the patient to the emergency department.  Here in the ER, patient has no complaints and would like to be discharged home.  She is currently denying chest pain, shortness of breath, abdominal pain, nausea, vomiting or other systemic symptoms.   Past Medical History Past Medical History:  Diagnosis Date   Anxiety    CAD (coronary artery disease)    DES to circumflex 02/2007, BMS to LAD and PTCA diagonal 03/2007   Carotid artery plaque    Mild   Cataract    Depression    Diverticulitis, colon    Elevated d-dimer 01/08/2014   Essential hypertension, benign    GERD (gastroesophageal reflux disease)    H/O hiatal hernia    HLD (hyperlipidemia)    IDDM (insulin  dependent diabetes mellitus)    Migraine    used to have them really bad; don't have them anymore (01/07/2014)   MS (multiple sclerosis) (HCC)    Not confirmed   PAT (paroxysmal atrial tachycardia) (HCC)    Prolapse of uterus    PVD (peripheral vascular disease) (HCC)    TIA (transient ischemic attack) 1980's   Patient Active Problem List   Diagnosis Date Noted   Seizures (HCC) 05/17/2024   Hypoalbuminemia due to protein-calorie malnutrition (HCC) 05/17/2024   Nonrheumatic mitral valve stenosis 02/03/2024   Type 2 diabetes mellitus with complication, without long-term current use of insulin  (HCC) 01/16/2024   Seizure-like activity (HCC) 01/14/2024    Clavicular fracture 01/14/2024   Hypertensive urgency 11/28/2023   Stage 3b chronic kidney disease (HCC) 10/09/2023   Vertigo 05/13/2023   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   Unstable angina (HCC) 02/12/2023   Incomplete emptying of bladder 02/20/2022   Restless leg syndrome 01/09/2021   Insomnia due to medical condition 01/09/2021   Congestive heart failure (HCC) 11/30/2020   Depression 11/02/2020   Recurrent UTI 10/11/2020   Generalized weakness 10/11/2020   Recurrent falls 10/11/2020   QT prolongation 10/11/2020   Osteopenia after menopause 12/13/2017   B12 deficiency 01/13/2016   History of stroke 09/01/2015   History of TIA (transient ischemic attack) 08/14/2014   Cataract 02/08/2014   Macular degeneration 02/08/2014   Accelerating angina (HCC) 01/07/2014   Generalized anxiety disorder 09/14/2013   Claudication (HCC) 06/18/2013   Gastroesophageal reflux disease without esophagitis 01/01/2012   Hypokalemia 08/24/2011   Type 2 diabetes mellitus with hyperglycemia (HCC) 08/23/2011   Essential hypertension 08/23/2011   PALPITATIONS 02/10/2010   Mixed hyperlipidemia 05/10/2009   Coronary artery disease involving native coronary artery of native heart with unstable angina pectoris (HCC) 08/26/2008   Home Medication(s) Prior to Admission medications   Medication Sig Start Date End Date Taking? Authorizing Provider  acetaminophen  (TYLENOL ) 500 MG tablet Take 1,000 mg by mouth every 6 (six) hours as needed for mild pain (pain score 1-3) or headache.   Yes [provider]  aspirin  81 MG chewable tablet Chew 1  tablet (81 mg total) by mouth daily. 03/28/22  Yes Rai, Ripudeep K, MD  clopidogrel  (PLAVIX ) 75 MG tablet Take 1 tablet (75 mg total) by mouth daily with breakfast. 10/02/23  Yes Crenshaw, Redell RAMAN, MD  famotidine  (PEPCID ) 20 MG tablet Take 1 tablet (20 mg total) by mouth 2 (two) times daily. 02/28/24  Yes [name], FNP  furosemide  (LASIX ) 20 MG tablet TAKE ONE (1) TABLET BY  MOUTH EVERY DAY 05/06/24  Yes [name], FNP  Homeopathic Products (MUSCLE CRAMP COMPLEX PO) Take 1 tablet by mouth as needed (muscle spasms).   Yes [provider]  insulin  aspart (NOVOLOG  FLEXPEN) 100 UNIT/ML FlexPen Inject 0-6 Units into the skin 3 (three) times daily with meals. Inject per sliding scale 0-200 = 0 units, if less than 70 notify provider 201-250 = 2 units 251-300 = 3 units 301-350 = 4 units 351-400 = 5 units 401-450 = 6 units >450 = notify provider 01/16/24  Yes Ricky Fines, MD  insulin  degludec (TRESIBA  FLEXTOUCH) 200 UNIT/ML FlexTouch Pen Inject 40 units subcutaneously daily. 04/22/24  Yes [name], FNP  isosorbide  mononitrate (IMDUR ) 60 MG 24 hr tablet Take 1 tablet (60 mg total) by mouth daily. 03/25/24  Yes Lavona Agent, MD  lisinopril  (ZESTRIL ) 10 MG tablet Take 1 tablet (10 mg total) by mouth daily. 11/21/22  Yes Lavona Agent, MD  loratadine  (CLARITIN ) 10 MG tablet Take 10 mg by mouth daily as needed for allergies.   Yes [provider]  metoprolol  tartrate (LOPRESSOR ) 25 MG tablet Take 1 tablet (25 mg total) by mouth 2 (two) times daily. 05/08/24  Yes [name], FNP  nitroGLYCERIN  (NITROSTAT ) 0.4 MG SL tablet DISSOLVE 1 TABLET UNDER TONGUE FOR CHESTPAIN.MAY REPEAT EVERY 5 MINUTES FOR 3 DOSES.IF NO RELIEF CALL 911 OR GO TO ER 03/09/24  Yes [name], FNP  potassium chloride  (KLOR-CON  M) 10 MEQ tablet Take 2 tablets (20 mEq total) by mouth 2 (two) times daily. 03/02/24  Yes [name], FNP  rOPINIRole  (REQUIP ) 1 MG tablet TAKE 1 TABLET BY MOUTH DAILY AS NEEDED Patient taking differently: Take 1 mg by mouth daily as needed (restless legs). 05/06/24  Yes [name], FNP  rosuvastatin  (CRESTOR ) 20 MG tablet TAKE ONE (1) TABLET BY MOUTH EVERY DAY Patient taking differently: Take 20 mg by mouth daily. 02/10/24  Yes [name], FNP  sertraline  (ZOLOFT ) 50 MG tablet Take 1.5 tablets (75 mg total) by mouth  daily. 05/06/24  Yes [name], FNP  traZODone  (DESYREL ) 50 MG tablet Take 1 tablet (50 mg total) by mouth at bedtime as needed for sleep. 05/06/24  Yes [name], FNP  Blood Glucose Monitoring Suppl DEVI 1 each by Does not apply route in the morning, at noon, and at bedtime. May substitute to any manufacturer covered by patient's insurance. 06/19/23   [name], FNP  Continuous Glucose Sensor (FREESTYLE LIBRE 3 PLUS SENSOR) MISC Change sensor every 15 days. Apply to back of upper arm. DX: E11.65 02/12/24   [name], FNP  Past Surgical History Past Surgical History:  Procedure Laterality Date   ABDOMINAL HYSTERECTOMY  1986   ovaries remain - prolaspe uterus    APPENDECTOMY  ~ 1970   BREAST BIOPSY Right 1980's   BREAST LUMPECTOMY Right 1980's   Dr. MYRTIS Salt    CARDIAC CATHETERIZATION  01/07/2014   CHOLECYSTECTOMY  ?1987   COLONOSCOPY  2002   Dr. Anwar--> Severe diverticular changes in the region of the sigmoid and descending colon with scattered diverticular changes throughout the rest of the colon. No polyps, ulcerations. Despite numerous manipulations, the tip of the scope could not be tipped into the cecal area.   COLONOSCOPY  01/10/2012   Procedure: COLONOSCOPY;  Surgeon: Lamar CHRISTELLA Hollingshead, MD;  Location: [facility];  Service: Endoscopy;  Laterality: N/A;  1:55   CORONARY ANGIOPLASTY WITH STENT PLACEMENT  ~ 1997 X 2   2 + 1   CORONARY ARTERY BYPASS GRAFT N/A 02/19/2023   Procedure: OFF PUMP CORONARY ARTERY BYPASS GRAFTING (CABG) X 1;  Surgeon: Shyrl Linnie KIDD, MD;  Location: [facility] OR;  Service: Open Heart Surgery;  Laterality: N/A;  LIMA TO LAD   CORONARY BALLOON ANGIOPLASTY N/A 10/05/2022   Procedure: CORONARY BALLOON ANGIOPLASTY;  Surgeon: Wonda Sharper, MD;  Location: [facility] INVASIVE CV LAB;  Service: Cardiovascular;  Laterality:  N/A;   CORONARY PRESSURE/FFR STUDY N/A 03/08/2017   Procedure: Intravascular Pressure Wire/FFR Study;  Surgeon: Mady Bruckner, MD;  Location: [facility] INVASIVE CV LAB;  Service: Cardiovascular;  Laterality: N/A;   CORONARY STENT INTERVENTION N/A 03/26/2022   Procedure: CORONARY STENT INTERVENTION;  Surgeon: Court Dorn PARAS, MD;  Location: [facility] INVASIVE CV LAB;  Service: Cardiovascular;  Laterality: N/A;   EYE SURGERY Bilateral 2014   cataract   LEFT HEART CATH AND CORONARY ANGIOGRAPHY N/A 03/08/2017   Procedure: Left Heart Cath and Coronary Angiography;  Surgeon: Mady Bruckner, MD;  Location: [facility] INVASIVE CV LAB;  Service: Cardiovascular;  Laterality: N/A;   LEFT HEART CATH AND CORONARY ANGIOGRAPHY N/A 03/26/2022   Procedure: LEFT HEART CATH AND CORONARY ANGIOGRAPHY;  Surgeon: Court Dorn PARAS, MD;  Location: [facility] INVASIVE CV LAB;  Service: Cardiovascular;  Laterality: N/A;   LEFT HEART CATH AND CORONARY ANGIOGRAPHY N/A 10/05/2022   Procedure: LEFT HEART CATH AND CORONARY ANGIOGRAPHY;  Surgeon: Wonda Sharper, MD;  Location: [facility] INVASIVE CV LAB;  Service: Cardiovascular;  Laterality: N/A;   LEFT HEART CATH AND CORONARY ANGIOGRAPHY N/A 02/14/2023   Procedure: LEFT HEART CATH AND CORONARY ANGIOGRAPHY;  Surgeon: Swazil[place], Peter M, MD;  Location: [facility] INVASIVE CV LAB;  Service: Cardiovascular;  Laterality: N/A;   LEFT HEART CATHETERIZATION WITH CORONARY ANGIOGRAM N/A 01/07/2014   Procedure: LEFT HEART CATHETERIZATION WITH CORONARY ANGIOGRAM;  Surgeon: Ezra GORMAN Shuck, MD;  Location: [facility] CATH LAB;  Service: Cardiovascular;  Laterality: N/A;   TEE WITHOUT CARDIOVERSION N/A 02/19/2023   Procedure: TRANSESOPHAGEAL ECHOCARDIOGRAM;  Surgeon: Shyrl Linnie KIDD, MD;  Location: [facility] OR;  Service: Open Heart Surgery;  Laterality: N/A;   Family History Family History  Problem Relation Age of Onset   Heart attack Mother 50   Diabetes Mother    Hypertension Mother    Heart attack Father 44   Heart attack Brother 32        x 6   Heart disease Brother    Diabetes Brother    Colon cancer Paternal Aunt        59s, died with brain anuerysm   Crohn's disease Cousin  paternal   Diabetes Sister    GER disease Daughter    Cervical cancer Daughter    Diabetes Daughter     Social History Social History   Tobacco Use   Smoking status: Never   Smokeless tobacco: Never   Tobacco comments:    spouse, 41 years - husband has quit 01/2011  Vaping Use   Vaping status: Never Used  Substance Use Topics   Alcohol use: No   Drug use: No   Allergies Iohexol , Codeine, Metformin  and related, Ticlid [ticlopidine hcl], and Jardiance  [empagliflozin ]  Review of Systems Review of Systems  Neurological:  Positive for speech difficulty.    Physical Exam Vital Signs  I have reviewed the triage vital signs BP (!) 169/66   Pulse 74   Temp 98.1 F (36.7 C) (Oral)   Resp 16   Ht 5' 6 (1.676 m)   Wt 73 kg   SpO2 98%   BMI 25.98 kg/m   Physical Exam Vitals and nursing note reviewed.  Constitutional:      General: She is not in acute distress.    Appearance: She is well-developed.  HENT:     Head: Normocephalic and atraumatic.  Eyes:     Conjunctiva/sclera: Conjunctivae normal.  Cardiovascular:     Rate and Rhythm: Normal rate and regular rhythm.     Heart sounds: No murmur heard. Pulmonary:     Effort: Pulmonary effort is normal. No respiratory distress.     Breath sounds: Normal breath sounds.  Abdominal:     Palpations: Abdomen is soft.     Tenderness: There is no abdominal tenderness.  Musculoskeletal:        General: No swelling.     Cervical back: Neck supple.  Skin:    General: Skin is warm and dry.     Capillary Refill: Capillary refill takes less than 2 seconds.  Neurological:     Mental Status: She is alert.     Cranial Nerves: No cranial nerve deficit.     Sensory: No sensory deficit.     Motor: No weakness.  Psychiatric:        Mood and Affect: Mood normal.     ED Results  and Treatments Labs (all labs ordered are listed, but only abnormal results are displayed) Labs Reviewed  COMPREHENSIVE METABOLIC PANEL WITH GFR - Abnormal; Notable for the following components:      Result Value   Sodium 131 (*)    Chloride 97 (*)    Glucose, Bld 489 (*)    Creatinine, Ser 1.16 (*)    Albumin  3.2 (*)    AST 13 (*)    GFR, Estimated 49 (*)    All other components within normal limits  CBC WITH DIFFERENTIAL/PLATELET - Abnormal; Notable for the following components:   RBC 3.51 (*)    Hemoglobin 10.5 (*)    HCT 31.0 (*)    All other components within normal limits  CBG MONITORING, ED - Abnormal; Notable for the following components:   Glucose-Capillary 425 (*)    All other components within normal limits  CBG MONITORING, ED - Abnormal; Notable for the following components:   Glucose-Capillary 304 (*)    All other components within normal limits  COMPREHENSIVE METABOLIC PANEL WITH GFR  CBC  MAGNESIUM   PHOSPHORUS  Radiology CT Head Wo Contrast Result Date: 05/17/2024 CLINICAL DATA:  Neuro deficit, acute, stroke suspected Seizure, HTN, slurred speech, but when ems arrived no slurred speech but pt's bp was 202/97. EXAM: CT HEAD WITHOUT CONTRAST TECHNIQUE: Contiguous axial images were obtained from the base of the skull through the vertex without intravenous contrast. RADIATION DOSE REDUCTION: This exam was performed according to the departmental dose-optimization program which includes automated exposure control, adjustment of the mA and/or kV according to patient size and/or use of iterative reconstruction technique. COMPARISON:  MRI head 01/14/2024, CT head 01/13/2024 FINDINGS: Brain: Cerebral ventricle sizes are concordant with the degree of cerebral volume loss. Patchy and confluent areas of decreased attenuation are noted throughout the deep and  periventricular white matter of the cerebral hemispheres bilaterally, compatible with chronic microvascular ischemic disease. No evidence of large-territorial acute infarction. No parenchymal hemorrhage. No mass lesion. No extra-axial collection. No mass effect or midline shift. No hydrocephalus. Basilar cisterns are patent. Vascular: No hyperdense vessel. Atherosclerotic calcifications are present within the cavernous internal carotid and vertebral arteries. Skull: No acute fracture or focal lesion. Sinuses/Orbits: Paranasal sinuses and mastoid air cells are clear. Bilateral lens replacement. Otherwise the orbits are unremarkable. Other: None. IMPRESSION: No acute intracranial abnormality. Electronically Signed   By: Morgane  Naveau M.D.   On: 05/17/2024 19:36    Pertinent labs & imaging results that were available during my care of the patient were reviewed by me and considered in my medical decision making (see MDM for details).  Medications Ordered in ED Medications  insulin  aspart (novoLOG ) injection 0-9 Units (has no administration in time range)  insulin  aspart (novoLOG ) injection 0-5 Units (5 Units Subcutaneous Given 05/17/24 2011)  hydrALAZINE  (APRESOLINE ) injection 10 mg (has no administration in time range)  enoxaparin  (LOVENOX ) injection 40 mg (40 mg Subcutaneous Given 05/17/24 2228)  acetaminophen  (TYLENOL ) tablet 650 mg (has no administration in time range)    Or  acetaminophen  (TYLENOL ) suppository 650 mg (has no administration in time range)  prochlorperazine  (COMPAZINE ) injection 10 mg (has no administration in time range)  feeding supplement (GLUCERNA SHAKE) (GLUCERNA SHAKE) liquid 237 mL (has no administration in time range)  levETIRAcetam  (KEPPRA ) undiluted injection 2,000 mg (2,000 mg Intravenous Given 05/17/24 1856)  labetalol  (NORMODYNE ) injection 5 mg (5 mg Intravenous Given 05/17/24 2007)  lactated ringers  bolus 1,000 mL (0 mLs Intravenous Stopped 05/17/24 2138)  potassium  chloride 10 mEq in 100 mL IVPB (0 mEq Intravenous Stopped 05/17/24 2117)                                                                                                                                     Procedures .Critical Care  Performed by: Albertina Dixon, MD Authorized by: Albertina Dixon, MD   Critical care provider statement:    Critical care time (minutes):  30   Critical care was necessary to treat or prevent imminent or life-threatening deterioration of  the following conditions:  CNS failure or compromise   Critical care was time spent personally by me on the following activities:  Development of treatment plan with patient or surrogate, discussions with consultants, evaluation of patient's response to treatment, examination of patient, ordering and review of laboratory studies, ordering and review of radiographic studies, ordering and performing treatments and interventions, pulse oximetry, re-evaluation of patient's condition and review of old charts   (including critical care time)  Medical Decision Making / ED Course   This patient presents to the ED for concern of slurred speech, hypertension, this involves an extensive number of treatment options, and is a complaint that carries with it a high risk of complications and morbidity.  The differential diagnosis includes hypertensive urgency, hypertensive emergency, intracranial bleed, CVA, encephalopathy  MDM: Patient seen emerged part for evaluation of slurred speech and hypertension.  Physical exam is unremarkable with no focal motor or sensory deficits, no cranial nerve deficits.  Laboratory valuation with a hemoglobin of 10.5, glucose 489 but anion gap is normal.  Fluid resuscitation begun as well as insulin  and labetalol  given for blood pressure.  Shortly after arriving the emergency department I was called back to the patient's room as her slurred speech had returned.  Patient actively having a seizure in the emergency  department that appears to be complex partial seizure behavior as she is alert and answering questions but having persistent left-sided facial twitching, lipsmacking.  This behavior self aborted and she subsequently had slurred speech.  Patient Keppra  loaded as she is currently not on anti epileptics.  CT reassuringly negative for stroke or bleed.  I spoke with Dr. Deedra of neurology who is recommending [place] admission for continuous EEG.  Patient admitted   Additional history obtained:  -External records from outside source obtained and reviewed including: Chart review including previous notes, labs, imaging, consultation notes   Lab Tests: -I ordered, reviewed, and interpreted labs.   The pertinent results include:   Labs Reviewed  COMPREHENSIVE METABOLIC PANEL WITH GFR - Abnormal; Notable for the following components:      Result Value   Sodium 131 (*)    Chloride 97 (*)    Glucose, Bld 489 (*)    Creatinine, Ser 1.16 (*)    Albumin  3.2 (*)    AST 13 (*)    GFR, Estimated 49 (*)    All other components within normal limits  CBC WITH DIFFERENTIAL/PLATELET - Abnormal; Notable for the following components:   RBC 3.51 (*)    Hemoglobin 10.5 (*)    HCT 31.0 (*)    All other components within normal limits  CBG MONITORING, ED - Abnormal; Notable for the following components:   Glucose-Capillary 425 (*)    All other components within normal limits  CBG MONITORING, ED - Abnormal; Notable for the following components:   Glucose-Capillary 304 (*)    All other components within normal limits  COMPREHENSIVE METABOLIC PANEL WITH GFR  CBC  MAGNESIUM   PHOSPHORUS      Imaging Studies ordered: I ordered imaging studies including CT head I independently visualized and interpreted imaging. I agree with the radiologist interpretation   Medicines ordered and prescription drug management: Meds ordered this encounter  Medications   DISCONTD: metoprolol  tartrate (LOPRESSOR ) tablet  25 mg   DISCONTD: lisinopril  (ZESTRIL ) tablet 10 mg   DISCONTD: metoprolol  tartrate (LOPRESSOR ) tablet 25 mg   DISCONTD: lisinopril  (ZESTRIL ) tablet 10 mg   levETIRAcetam  (KEPPRA ) undiluted injection 2,000 mg  labetalol  (NORMODYNE ) injection 5 mg   lactated ringers  bolus 1,000 mL   insulin  aspart (novoLOG ) injection 0-9 Units    Correction coverage::   Sensitive (thin, NPO, renal)    CBG < 70::   Implement Hypoglycemia Standing Orders and refer to Hypoglycemia Standing Orders sidebar report    CBG 70 - 120::   0 units    CBG 121 - 150::   1 unit    CBG 151 - 200::   2 units    CBG 201 - 250::   3 units    CBG 251 - 300::   5 units    CBG 301 - 350::   7 units    CBG 351 - 400:   9 units    CBG > 400:   call MD and obtain STAT lab verification   insulin  aspart (novoLOG ) injection 0-5 Units    Correction coverage::   HS scale    CBG < 70::   Implement Hypoglycemia Standing Orders and refer to Hypoglycemia Standing Orders sidebar report    CBG 70 - 120::   0 units    CBG 121 - 150::   0 units    CBG 151 - 200::   0 units    CBG 201 - 250::   2 units    CBG 251 - 300::   3 units    CBG 301 - 350::   4 units    CBG 351 - 400::   5 units    CBG > 400:   call MD and obtain STAT lab verification   potassium chloride  10 mEq in 100 mL IVPB   hydrALAZINE  (APRESOLINE ) injection 10 mg   enoxaparin  (LOVENOX ) injection 40 mg   OR Linked Order Group    acetaminophen  (TYLENOL ) tablet 650 mg    acetaminophen  (TYLENOL ) suppository 650 mg   prochlorperazine  (COMPAZINE ) injection 10 mg   feeding supplement (GLUCERNA SHAKE) (GLUCERNA SHAKE) liquid 237 mL    -I have reviewed the patients home medicines and have made adjustments as needed  Critical interventions Keppra , neurology consultation, emergent head CT  Consultations Obtained: I requested consultation with the neurologist on-call Dr. Deedra,  and discussed lab and imaging findings as well as pertinent plan - they recommend: [facility]  transfer for continuous EEG   Cardiac Monitoring: The patient was maintained on a cardiac monitor.  I personally viewed and interpreted the cardiac monitored which showed an underlying rhythm of: NSR  Social Determinants of Health:  Factors impacting patients care include: none   Reevaluation: After the interventions noted above, I reevaluated the patient and found that they have :improved  Co morbidities that complicate the patient evaluation  Past Medical History:  Diagnosis Date   Anxiety    CAD (coronary artery disease)    DES to circumflex 02/2007, BMS to LAD and PTCA diagonal 03/2007   Carotid artery plaque    Mild   Cataract    Depression    Diverticulitis, colon    Elevated d-dimer 01/08/2014   Essential hypertension, benign    GERD (gastroesophageal reflux disease)    H/O hiatal hernia    HLD (hyperlipidemia)    IDDM (insulin  dependent diabetes mellitus)    Migraine    used to have them really bad; don't have them anymore (01/07/2014)   MS (multiple sclerosis) (HCC)    Not confirmed   PAT (paroxysmal atrial tachycardia) (HCC)    Prolapse of uterus    PVD (peripheral vascular disease) (HCC)  TIA (transient ischemic attack) 1980's      Dispostion: I considered admission for this patient, and patient require hospital admission for seizure and hypertension     Final Clinical Impression(s) / ED Diagnoses Final diagnoses:  Seizure [facility])  Primary hypertension     @PCDICTATION @    Albertina Dixon, MD 05/17/24 2244

## 2024-05-17 NOTE — ED Triage Notes (Signed)
 Family called ems for slurred speech, but when ems arrived no slurred speech but pt's bp was 202/97. Pt has no complaints.

## 2024-05-17 NOTE — H&P (Signed)
 History and Physical    Patient: [name] FMW:[id number] DOB: Feb 28, 1951 DOA: 05/17/2024 DOS: the patient was seen and examined on 05/17/2024 PCP: [name], FNP  Patient coming from: Home  Chief Complaint:  Chief Complaint  Patient presents with   Hypertension   HPI: AMIYRAH [name] is a 73 y.o. female with medical history significant of hypertension, hyperlipidemia, T2DM, CAD s/p CABG (02/19/2023), TIA and depression who presents to the emergency department due to concern for intermittent confusion and subsequent slurred speech.  Patient states that she was talking with her daughter on the phone when it appeared that she started to have a slurred speech since the daughter kept saying that she was unable to understand her.  EMS was activated and patient was taken to the ED where the EDP witnessed an episode of facial twitching and lipsmacking which was convincing for seizure.  She denies fever, chills, chest pain, shortness of breath, word finding difficulties. Patient states that she has had similar presentation before and at that time it was also suspected that she was having seizures but she was not started on antiseizure medications.  ED Course:  In the emergency department, BP was elevated at 200/80, but other vital signs were within normal range.  Workup in the ED showed normocytic anemia.  BMP was normal except for sodium of 131, chloride 97, blood glucose 489, creatinine 1.16 (baseline creatinine at 1.0), albumin  3.2. CT head without contrast showed no acute intracranial abnormality Neurologist was consulted and recommended loading dose of Keppra  and to admit patient to [facility] for neurology consult and LTM EEG to spell capture. Labetalol  was given due to elevated BP, she was started on sliding scale insulin  due to hypoglycemia.  Potassium was provided. [facility] was asked to admit patient.  Review of Systems: Review of systems as noted in the HPI. All other systems reviewed and  are negative.   Past Medical History:  Diagnosis Date   Anxiety    CAD (coronary artery disease)    DES to circumflex 02/2007, BMS to LAD and PTCA diagonal 03/2007   Carotid artery plaque    Mild   Cataract    Depression    Diverticulitis, colon    Elevated d-dimer 01/08/2014   Essential hypertension, benign    GERD (gastroesophageal reflux disease)    H/O hiatal hernia    HLD (hyperlipidemia)    IDDM (insulin  dependent diabetes mellitus)    Migraine    used to have them really bad; don't have them anymore (01/07/2014)   MS (multiple sclerosis) (HCC)    Not confirmed   PAT (paroxysmal atrial tachycardia) (HCC)    Prolapse of uterus    PVD (peripheral vascular disease) (HCC)    TIA (transient ischemic attack) 1980's   Past Surgical History:  Procedure Laterality Date   ABDOMINAL HYSTERECTOMY  1986   ovaries remain - prolaspe uterus    APPENDECTOMY  ~ 1970   BREAST BIOPSY Right 1980's   BREAST LUMPECTOMY Right 1980's   Dr. MYRTIS Salt    CARDIAC CATHETERIZATION  01/07/2014   CHOLECYSTECTOMY  ?1987   COLONOSCOPY  2002   Dr. Anwar--> Severe diverticular changes in the region of the sigmoid and descending colon with scattered diverticular changes throughout the rest of the colon. No polyps, ulcerations. Despite numerous manipulations, the tip of the scope could not be tipped into the cecal area.   COLONOSCOPY  01/10/2012   Procedure: COLONOSCOPY;  Surgeon: Lamar HERO Hollingshead, MD;  Location:  [facility];  Service: Endoscopy;  Laterality: N/A;  1:55   CORONARY ANGIOPLASTY WITH STENT PLACEMENT  ~ 1997 X 2   2 + 1   CORONARY ARTERY BYPASS GRAFT N/A 02/19/2023   Procedure: OFF PUMP CORONARY ARTERY BYPASS GRAFTING (CABG) X 1;  Surgeon: Shyrl Linnie KIDD, MD;  Location: [facility] OR;  Service: Open Heart Surgery;  Laterality: N/A;  LIMA TO LAD   CORONARY BALLOON ANGIOPLASTY N/A 10/05/2022   Procedure: CORONARY BALLOON ANGIOPLASTY;  Surgeon: Wonda Sharper, MD;  Location: [facility] INVASIVE CV LAB;   Service: Cardiovascular;  Laterality: N/A;   CORONARY PRESSURE/FFR STUDY N/A 03/08/2017   Procedure: Intravascular Pressure Wire/FFR Study;  Surgeon: Mady Bruckner, MD;  Location: [facility] INVASIVE CV LAB;  Service: Cardiovascular;  Laterality: N/A;   CORONARY STENT INTERVENTION N/A 03/26/2022   Procedure: CORONARY STENT INTERVENTION;  Surgeon: Court Dorn PARAS, MD;  Location: [facility] INVASIVE CV LAB;  Service: Cardiovascular;  Laterality: N/A;   EYE SURGERY Bilateral 2014   cataract   LEFT HEART CATH AND CORONARY ANGIOGRAPHY N/A 03/08/2017   Procedure: Left Heart Cath and Coronary Angiography;  Surgeon: Mady Bruckner, MD;  Location: [facility] INVASIVE CV LAB;  Service: Cardiovascular;  Laterality: N/A;   LEFT HEART CATH AND CORONARY ANGIOGRAPHY N/A 03/26/2022   Procedure: LEFT HEART CATH AND CORONARY ANGIOGRAPHY;  Surgeon: Court Dorn PARAS, MD;  Location: [facility] INVASIVE CV LAB;  Service: Cardiovascular;  Laterality: N/A;   LEFT HEART CATH AND CORONARY ANGIOGRAPHY N/A 10/05/2022   Procedure: LEFT HEART CATH AND CORONARY ANGIOGRAPHY;  Surgeon: Wonda Sharper, MD;  Location: [facility] INVASIVE CV LAB;  Service: Cardiovascular;  Laterality: N/A;   LEFT HEART CATH AND CORONARY ANGIOGRAPHY N/A 02/14/2023   Procedure: LEFT HEART CATH AND CORONARY ANGIOGRAPHY;  Surgeon: Swaziland, Peter M, MD;  Location: [facility] INVASIVE CV LAB;  Service: Cardiovascular;  Laterality: N/A;   LEFT HEART CATHETERIZATION WITH CORONARY ANGIOGRAM N/A 01/07/2014   Procedure: LEFT HEART CATHETERIZATION WITH CORONARY ANGIOGRAM;  Surgeon: Ezra GORMAN Shuck, MD;  Location: [facility] CATH LAB;  Service: Cardiovascular;  Laterality: N/A;   TEE WITHOUT CARDIOVERSION N/A 02/19/2023   Procedure: TRANSESOPHAGEAL ECHOCARDIOGRAM;  Surgeon: Shyrl Linnie KIDD, MD;  Location: [facility] OR;  Service: Open Heart Surgery;  Laterality: N/A;    Social History:  reports that she has never smoked. She has never used smokeless tobacco. She reports that she does not drink alcohol and does not use  drugs.   Allergies  Allergen Reactions   Iohexol  Anaphylaxis and Nausea Only    Pt had syncopal episode with nausea post IV CM late 1990's,  pt has had prednisone  prep with heart caths x 2 without problem  kdean 04/16/07, Onset Date: 04-15-1996   Codeine Nausea And Vomiting and Palpitations   Metformin  And Related Diarrhea   Ticlid [Ticlopidine Hcl] Nausea And Vomiting   Jardiance  [Empagliflozin ] Other (See Comments)    Recurrent UTIs    Family History  Problem Relation Age of Onset   Heart attack Mother 74   Diabetes Mother    Hypertension Mother    Heart attack Father 23   Heart attack Brother 32       x 6   Heart disease Brother    Diabetes Brother    Colon cancer Paternal Aunt        23s, died with brain anuerysm   Crohn's disease Cousin        paternal   Diabetes Sister    GER disease Daughter    Cervical cancer  Daughter    Diabetes Daughter      Prior to Admission medications   Medication Sig Start Date End Date Taking? Authorizing Provider  acetaminophen  (TYLENOL ) 500 MG tablet Take 1,000 mg by mouth every 6 (six) hours as needed for mild pain (pain score 1-3) or headache.   Yes [provider]  aspirin  81 MG chewable tablet Chew 1 tablet (81 mg total) by mouth daily. 03/28/22  Yes Rai, Ripudeep K, MD  clopidogrel  (PLAVIX ) 75 MG tablet Take 1 tablet (75 mg total) by mouth daily with breakfast. 10/02/23  Yes Crenshaw, Redell RAMAN, MD  famotidine  (PEPCID ) 20 MG tablet Take 1 tablet (20 mg total) by mouth 2 (two) times daily. 02/28/24  Yes [name], FNP  furosemide  (LASIX ) 20 MG tablet TAKE ONE (1) TABLET BY MOUTH EVERY DAY 05/06/24  Yes [name], FNP  Homeopathic Products (MUSCLE CRAMP COMPLEX PO) Take 1 tablet by mouth as needed (muscle spasms).   Yes [provider]  insulin  aspart (NOVOLOG  FLEXPEN) 100 UNIT/ML FlexPen Inject 0-6 Units into the skin 3 (three) times daily with meals. Inject per sliding scale 0-200 = 0 units, if less than 70  notify provider 201-250 = 2 units 251-300 = 3 units 301-350 = 4 units 351-400 = 5 units 401-450 = 6 units >450 = notify provider 01/16/24  Yes Ricky Fines, MD  insulin  degludec (TRESIBA  FLEXTOUCH) 200 UNIT/ML FlexTouch Pen Inject 40 units subcutaneously daily. 04/22/24  Yes [name], FNP  isosorbide  mononitrate (IMDUR ) 60 MG 24 hr tablet Take 1 tablet (60 mg total) by mouth daily. 03/25/24  Yes Lavona Agent, MD  lisinopril  (ZESTRIL ) 10 MG tablet Take 1 tablet (10 mg total) by mouth daily. 11/21/22  Yes Lavona Agent, MD  loratadine  (CLARITIN ) 10 MG tablet Take 10 mg by mouth daily as needed for allergies.   Yes [provider]  metoprolol  tartrate (LOPRESSOR ) 25 MG tablet Take 1 tablet (25 mg total) by mouth 2 (two) times daily. 05/08/24  Yes [name], FNP  nitroGLYCERIN  (NITROSTAT ) 0.4 MG SL tablet DISSOLVE 1 TABLET UNDER TONGUE FOR CHESTPAIN.MAY REPEAT EVERY 5 MINUTES FOR 3 DOSES.IF NO RELIEF CALL 911 OR GO TO ER 03/09/24  Yes [name], FNP  potassium chloride  (KLOR-CON  M) 10 MEQ tablet Take 2 tablets (20 mEq total) by mouth 2 (two) times daily. 03/02/24  Yes [name], FNP  rOPINIRole  (REQUIP ) 1 MG tablet TAKE 1 TABLET BY MOUTH DAILY AS NEEDED Patient taking differently: Take 1 mg by mouth daily as needed (restless legs). 05/06/24  Yes [name], FNP  rosuvastatin  (CRESTOR ) 20 MG tablet TAKE ONE (1) TABLET BY MOUTH EVERY DAY Patient taking differently: Take 20 mg by mouth daily. 02/10/24  Yes [name], FNP  sertraline  (ZOLOFT ) 50 MG tablet Take 1.5 tablets (75 mg total) by mouth daily. 05/06/24  Yes [name], FNP  traZODone  (DESYREL ) 50 MG tablet Take 1 tablet (50 mg total) by mouth at bedtime as needed for sleep. 05/06/24  Yes [name], FNP  Blood Glucose Monitoring Suppl DEVI 1 each by Does not apply route in the morning, at noon, and at bedtime. May substitute to any manufacturer covered by patient's insurance.  06/19/23   [name], FNP  Continuous Glucose Sensor (FREESTYLE LIBRE 3 PLUS SENSOR) MISC Change sensor every 15 days. Apply to back of upper arm. DX: E11.65 02/12/24   [name], FNP    Physical Exam: BP ROLLEN)  169/66   Pulse 74   Temp 98.1 F (36.7 C) (Oral)   Resp 16   Ht 5' 6 (1.676 m)   Wt 73 kg   SpO2 98%   BMI 25.98 kg/m   General: 73 y.o. year-old female well developed well nourished in no acute distress.  Alert and oriented x3. HEENT: NCAT, EOMI Neck: Supple, trachea medial Cardiovascular: Regular rate and rhythm with no rubs or gallops.  No thyromegaly or JVD noted.  No lower extremity edema. 2/4 pulses in all 4 extremities. Respiratory: Clear to auscultation with no wheezes or rales. Good inspiratory effort. Abdomen: Soft, nontender nondistended with normal bowel sounds x4 quadrants. Muskuloskeletal: No cyanosis, clubbing or edema noted bilaterally Neuro: CN II-XII intact, strength 5/5 x 4, sensation, reflexes intact Skin: No ulcerative lesions noted or rashes Psychiatry: Judgement and insight appear normal. Mood is appropriate for condition and setting          Labs on Admission:  Basic Metabolic Panel: Recent Labs  Lab 05/17/24 1845  NA 131*  K 3.9  CL 97*  CO2 22  GLUCOSE 489*  BUN 9  CREATININE 1.16*  CALCIUM  8.9   Liver Function Tests: Recent Labs  Lab 05/17/24 1845  AST 13*  ALT 10  ALKPHOS 74  BILITOT 0.3  PROT 6.7  ALBUMIN  3.2*   No results for input(s): LIPASE, AMYLASE in the last 168 hours. No results for input(s): AMMONIA in the last 168 hours. CBC: Recent Labs  Lab 05/17/24 1845  WBC 6.7  NEUTROABS 4.3  HGB 10.5*  HCT 31.0*  MCV 88.3  PLT 161   Cardiac Enzymes: No results for input(s): CKTOTAL, CKMB, CKMBINDEX, TROPONINI in the last 168 hours.  BNP (last 3 results) No results for input(s): BNP in the last 8760 hours.  ProBNP (last 3 results) No results for input(s): PROBNP in the last 8760  hours.  CBG: Recent Labs  Lab 05/17/24 1933 05/17/24 2142  GLUCAP 425* 304*    Radiological Exams on Admission: CT Head Wo Contrast Result Date: 05/17/2024 CLINICAL DATA:  Neuro deficit, acute, stroke suspected Seizure, HTN, slurred speech, but when ems arrived no slurred speech but pt's bp was 202/97. EXAM: CT HEAD WITHOUT CONTRAST TECHNIQUE: Contiguous axial images were obtained from the base of the skull through the vertex without intravenous contrast. RADIATION DOSE REDUCTION: This exam was performed according to the departmental dose-optimization program which includes automated exposure control, adjustment of the mA and/or kV according to patient size and/or use of iterative reconstruction technique. COMPARISON:  MRI head 01/14/2024, CT head 01/13/2024 FINDINGS: Brain: Cerebral ventricle sizes are concordant with the degree of cerebral volume loss. Patchy and confluent areas of decreased attenuation are noted throughout the deep and periventricular white matter of the cerebral hemispheres bilaterally, compatible with chronic microvascular ischemic disease. No evidence of large-territorial acute infarction. No parenchymal hemorrhage. No mass lesion. No extra-axial collection. No mass effect or midline shift. No hydrocephalus. Basilar cisterns are patent. Vascular: No hyperdense vessel. Atherosclerotic calcifications are present within the cavernous internal carotid and vertebral arteries. Skull: No acute fracture or focal lesion. Sinuses/Orbits: Paranasal sinuses and mastoid air cells are clear. Bilateral lens replacement. Otherwise the orbits are unremarkable. Other: None. IMPRESSION: No acute intracranial abnormality. Electronically Signed   By: Morgane  Naveau M.D.   On: 05/17/2024 19:36    EKG: I independently viewed the EKG done and my findings are as followed: Normal sinus rhythm at rate of 72 bpm with increased PR interval of 242  ms  Assessment/Plan Present on Admission:  Mixed  hyperlipidemia  Hypertensive urgency  Essential hypertension  Type 2 diabetes mellitus with hyperglycemia (HCC)  Coronary artery disease involving native coronary artery of native heart with unstable angina pectoris (HCC)  Depression  Principal Problem:   Seizures (HCC) Active Problems:   Coronary artery disease involving native coronary artery of native heart with unstable angina pectoris (HCC)   Essential hypertension   Mixed hyperlipidemia   Type 2 diabetes mellitus with hyperglycemia (HCC)   Depression   Hypertensive urgency   Hypoalbuminemia due to protein-calorie malnutrition (HCC)  Seizures CT head without contrast showed no acute intracranial abnormality CT of head without contrast done in April 2025 due to concern for headache and seizure also showed no acute acute abnormality 2 g loading dose of Keppra  was given [facility]rologist at [facility] was consulted and recommended admitting patient [facility] for further evaluation  Hypertensive urgency- resolved Essential hypertension - uncontrolled IV labetalol  5 mg x 1 was given Continue home metoprolol , Imdur  and lisinopril  Continue IV hydralazine  10 mg every 6 hours as needed for SBP > 170  Type 2 diabetes mellitus with hyperglycemia Hemoglobin A1c on 02/28/2023 was 8.0 Continue ISS and hypoglycemia protocol  Hypoalbuminemia possibly secondary to mild protein calorie malnutrition Albumin  3.2, protein supplement will be provided  Coronary artery disease Status post CABG 02/19/2023 No chest pain presently Continue Imdur , aspirin  and Plavix    Depression Continue sertraline    Mixed hyperlipidemia Continue statin   DVT prophylaxis: Lovenox   Code Status: Full code  Family Communication: None at bedside  Consults: Neurology (by [facility] EDP)  Severity of Illness: The appropriate patient status for this patient is INPATIENT. Inpatient status is judged to be reasonable and necessary in order to provide the required intensity of  service to ensure the patient's safety. The patient's presenting symptoms, physical exam findings, and initial radiographic and laboratory data in the context of their chronic comorbidities is felt to place them at high risk for further clinical deterioration. Furthermore, it is not anticipated that the patient will be medically stable for discharge from the hospital within 2 midnights of admission.   * I certify that at the point of admission it is my clinical judgment that the patient will require inpatient hospital care spanning beyond 2 midnights from the point of admission due to high intensity of service, high risk for further deterioration and high frequency of surveillance required.*  Author: Ivy Puryear, DO 05/17/2024 10:27 PM  For on call review [email or web address].

## 2024-05-18 DIAGNOSIS — G9341 Metabolic encephalopathy: Secondary | ICD-10-CM | POA: Insufficient documentation

## 2024-05-18 DIAGNOSIS — I160 Hypertensive urgency: Secondary | ICD-10-CM | POA: Diagnosis not present

## 2024-05-18 DIAGNOSIS — R569 Unspecified convulsions: Secondary | ICD-10-CM | POA: Diagnosis not present

## 2024-05-18 DIAGNOSIS — I25110 Atherosclerotic heart disease of native coronary artery with unstable angina pectoris: Secondary | ICD-10-CM

## 2024-05-18 DIAGNOSIS — N1831 Chronic kidney disease, stage 3a: Secondary | ICD-10-CM | POA: Insufficient documentation

## 2024-05-18 LAB — COMPREHENSIVE METABOLIC PANEL WITH GFR
ALT: 9 U/L (ref 0–44)
AST: 15 U/L (ref 15–41)
Albumin: 3 g/dL — ABNORMAL LOW (ref 3.5–5.0)
Alkaline Phosphatase: 71 U/L (ref 38–126)
Anion gap: 9 (ref 5–15)
BUN: 7 mg/dL — ABNORMAL LOW (ref 8–23)
CO2: 25 mmol/L (ref 22–32)
Calcium: 8.8 mg/dL — ABNORMAL LOW (ref 8.9–10.3)
Chloride: 104 mmol/L (ref 98–111)
Creatinine, Ser: 0.96 mg/dL (ref 0.44–1.00)
GFR, Estimated: >60 mL/min (ref >60)
Glucose, Bld: 238 mg/dL — ABNORMAL HIGH (ref 70–99)
Potassium: 3.7 mmol/L (ref 3.5–5.1)
Sodium: 138 mmol/L (ref 135–145)
Total Bilirubin: 0.5 mg/dL (ref 0.0–1.2)
Total Protein: 6.5 g/dL (ref 6.5–8.1)

## 2024-05-18 LAB — URINALYSIS, COMPLETE (UACMP) WITH MICROSCOPIC
Bacteria, UA: NONE SEEN
Bilirubin Urine: NEGATIVE
Glucose, UA: >=500 mg/dL — AB
Ketones, ur: NEGATIVE mg/dL
Leukocytes,Ua: NEGATIVE
Nitrite: NEGATIVE
Protein, ur: NEGATIVE mg/dL
Specific Gravity, Urine: 1.008 (ref 1.005–1.030)
pH: 7 (ref 5.0–8.0)

## 2024-05-18 LAB — MAGNESIUM: Magnesium: 1.8 mg/dL (ref 1.7–2.4)

## 2024-05-18 LAB — CBC
HCT: 31.3 % — ABNORMAL LOW (ref 36.0–46.0)
Hemoglobin: 10.7 g/dL — ABNORMAL LOW (ref 12.0–15.0)
MCH: 30 pg (ref 26.0–34.0)
MCHC: 34.2 g/dL (ref 30.0–36.0)
MCV: 87.7 fL (ref 80.0–100.0)
Platelets: 167 K/uL (ref 150–400)
RBC: 3.57 MIL/uL — ABNORMAL LOW (ref 3.87–5.11)
RDW: 13.4 % (ref 11.5–15.5)
WBC: 7.7 K/uL (ref 4.0–10.5)
nRBC: 0 % (ref 0.0–0.2)

## 2024-05-18 LAB — CBG MONITORING, ED
Glucose-Capillary: 226 mg/dL — ABNORMAL HIGH (ref 70–99)
Glucose-Capillary: 314 mg/dL — ABNORMAL HIGH (ref 70–99)
Glucose-Capillary: 305 mg/dL — ABNORMAL HIGH (ref 70–99)

## 2024-05-18 LAB — GLUCOSE, CAPILLARY
Glucose-Capillary: 216 mg/dL — ABNORMAL HIGH (ref 70–99)
Glucose-Capillary: 223 mg/dL — ABNORMAL HIGH (ref 70–99)
Glucose-Capillary: 230 mg/dL — ABNORMAL HIGH (ref 70–99)

## 2024-05-18 LAB — PHOSPHORUS: Phosphorus: 3.3 mg/dL (ref 2.5–4.6)

## 2024-05-18 MED ORDER — INSULIN GLARGINE 100 UNIT/ML ~~LOC~~ SOLN
20.0000 [IU] | Freq: Every day | SUBCUTANEOUS | Status: DC
  Administered 2024-05-18 – 2024-05-19 (×2): 20 [IU] via SUBCUTANEOUS
  Filled 2024-05-18 (×3): qty 0.2

## 2024-05-18 MED ORDER — SERTRALINE HCL 50 MG PO TABS
75.0000 mg | ORAL_TABLET | Freq: Every day | ORAL | Status: DC
  Administered 2024-05-18 – 2024-05-22 (×5): 75 mg via ORAL
  Filled 2024-05-18 (×3): qty 1
  Filled 2024-05-18: qty 2
  Filled 2024-05-18 (×2): qty 1

## 2024-05-18 MED ORDER — AMLODIPINE BESYLATE 5 MG PO TABS
5.0000 mg | ORAL_TABLET | Freq: Every day | ORAL | Status: DC
  Administered 2024-05-18 – 2024-05-22 (×5): 5 mg via ORAL
  Filled 2024-05-18 (×5): qty 1

## 2024-05-18 MED ORDER — VITAMIN B-12 1000 MCG PO TABS
500.0000 ug | ORAL_TABLET | Freq: Every day | ORAL | Status: DC
  Administered 2024-05-18 – 2024-05-19 (×2): 500 ug via ORAL
  Filled 2024-05-18 (×2): qty 1

## 2024-05-18 MED ORDER — ASPIRIN 81 MG PO CHEW
81.0000 mg | CHEWABLE_TABLET | Freq: Every day | ORAL | Status: DC
  Administered 2024-05-18 – 2024-05-22 (×5): 81 mg via ORAL
  Filled 2024-05-18 (×5): qty 1

## 2024-05-18 MED ORDER — LACTATED RINGERS IV SOLN: INTRAVENOUS | Status: AC

## 2024-05-18 MED ORDER — CLOPIDOGREL BISULFATE 75 MG PO TABS
75.0000 mg | ORAL_TABLET | Freq: Every day | ORAL | Status: DC
  Administered 2024-05-18 – 2024-05-22 (×5): 75 mg via ORAL
  Filled 2024-05-18 (×5): qty 1

## 2024-05-18 MED ORDER — HYDRALAZINE HCL 20 MG/ML IJ SOLN
5.0000 mg | Freq: Once | INTRAMUSCULAR | Status: DC
  Filled 2024-05-18: qty 1

## 2024-05-18 MED ORDER — ROSUVASTATIN CALCIUM 20 MG PO TABS
20.0000 mg | ORAL_TABLET | Freq: Every day | ORAL | Status: DC
  Administered 2024-05-18 – 2024-05-22 (×5): 20 mg via ORAL
  Filled 2024-05-18 (×5): qty 1

## 2024-05-18 MED ORDER — METOPROLOL TARTRATE 25 MG PO TABS
25.0000 mg | ORAL_TABLET | Freq: Two times a day (BID) | ORAL | Status: DC
  Administered 2024-05-18 – 2024-05-20 (×5): 25 mg via ORAL
  Filled 2024-05-18 (×5): qty 1

## 2024-05-18 MED ORDER — INSULIN GLARGINE 100 UNIT/ML ~~LOC~~ SOLN
20.0000 [IU] | Freq: Every day | SUBCUTANEOUS | Status: DC
  Filled 2024-05-18 (×2): qty 0.2

## 2024-05-18 MED ORDER — ISOSORBIDE MONONITRATE ER 60 MG PO TB24
60.0000 mg | ORAL_TABLET | Freq: Every day | ORAL | Status: DC
  Administered 2024-05-18 – 2024-05-22 (×5): 60 mg via ORAL
  Filled 2024-05-18 (×5): qty 1

## 2024-05-18 NOTE — Progress Notes (Signed)
 PROGRESS NOTE  [name] FMW:[id number] DOB: April 26, 1951 DOA: 05/17/2024 PCP: [name], FNP  Brief History:  coronary artery disease status post CABG 02/19/23, hypertension, hyperlipidemia, diabetes mellitus type 2, TIA, depression, atrial tachycardia presenting with slurred speech.  History is supplemented by the patient's family.  While speaking with the patient's daughter on the phone, the patient had slurred speech.  EMS was activated.  They noted that the patient had SBP in the 200s.  By the time the patient arrived in the emergency department, her symptoms had resolved.  She was noted to have a serum glucose of 489 with normal anion gap.  She was started on IV fluids and IV insulin .  The patient subsequently developed slurred speech with left facial twitching and lipsmacking in the emergency department.  She was awake and alert at the time. The patient denies any new medications. Patient denies fevers, chills, headache, chest pain, dyspnea, nausea, vomiting, diarrhea, abdominal pain, dysuria, hematuria, hematochezia, and melena. Case was discussed with neurology, Dr. Arora.  He recommended transfer to [facility] for LTM EEG..   In the ED, the patient was afebrile and hemodynamically stable with oxygen saturation 94% room air.  Sodium 131, potassium 3.9, bicarbonate 22, serum creatinine 1.16.  Serum glucose 489.  Anion gap 12.  WBC 6.7, hemoglobin 10.5, platelets 161.  She had a hospital admission from 01/14/2024 to 01/16/2024 secondary to recurrent falls and seizure-like activity and altered mental status apparently the patient's family noted the patient to be talking gibberish and subsequently developed seizure-like activity. She was noted to have orthostatic hypotension.  MRI of the brain was negative for any acute abnormalities.  EEG was negative for seizure.  Her metabolic workup including serum TSH, RPR,  folate, UA were unremarkable.  She was noted to have serum B12 of 138 which  was repleted.  She was discharged without any AEDs.   Assessment/Plan: Seizure-like activity - Reviewed the medical record as discussed above - It appears that the patient has had altered mental status and dizziness with seizure-like activity in each of the last 3 hospital admissions - She has presented with significant elevation of her serum glucose in the 400s and had required IV insulin  and IV fluid resuscitation - Certainly, there is concern that metabolic derangements may be contributing - Appreciate neurology eval - Planning to transfer to [facility] for LTM EEG  Hyperosmolar nonketotic state - Once again presented with serum glucose yesterday (05/17/24) 489 - Initially on IV insulin  IV fluids - Now transition back to [facility] insulin  - 02/28/2024 hemoglobin A1c 8.0  Acute metabolic encephalopathy/slurred speech - Secondary to the patient's uncontrolled hyperglycemia, possible postictal state - Patient recently had a 30-day heart monitor with no dysrhythmias as noted by Dr. Lavona 04/29/24 - Obtain UA - 05/06/2024 TSH 1.780 - 05/06/2024 serum B12 1130 - Check folate - Check VBG - I would advocate discontinuing ropinirole  altogether as dopamine agonist can contribute to altered mental status  Uncontrolled diabetes mellitus type 2 with hyperglycemia - 02/28/2024 hemoglobin A1c 8.0 - 11/28/2023 hemoglobin A1c 9.1 - NovoLog  sliding scale - Start reduced dose Semglee   Hypertensive urgency - Restart metoprolol  - Continue Imdur  - Restart metoprolol  - holding lisinopril  temporarily  B12 deficiency - Continue B12 supplementation - 05/06/2024 B12 level--1130  CKD stage IIIA - Baseline creatinine 1.0-1.2 - Monitor BMP  Coronary artery disease -Status post CABG 02/19/2023 -No chest pain presently -Continue aspirin  and Plavix  -continue imdur   Depression -Continue sertraline    Mixed hyperlipidemia -Continue statin  Social -pt lives alone, family lives near by -states she not  infrequently misses his HS meds, but endorses compliance with insulin  -endorses cheating with her diet--not any more than usual -seems to have poor insight into exact dose of insulin  she is suppose to take       Family Communication:   no Family at bedside  Consultants:  neurology  Code Status:  FULL   DVT Prophylaxis:  [place] Lovenox    Procedures: As Listed in Progress Note Above  Antibiotics: None       Subjective:  Patient denies fevers, chills, headache, chest pain, dyspnea, nausea, vomiting, diarrhea, abdominal pain, dysuria, hematuria, hematochezia, and melena.  Objective: Vitals:   05/18/24 0330 05/18/24 0400 05/18/24 0500 05/18/24 0600  BP: (!) 111/56 (!) 166/62 (!) 167/65 123/60  Pulse: 71 70 73 74  Resp: 16 15 14 15   Temp:      TempSrc:      SpO2: 96% 90% 92% 94%  Weight:      Height:        Intake/Output Summary (Last 24 hours) at 05/18/2024 0644 Last data filed at 05/17/2024 2138 Gross per 24 hour  Intake 1100 ml  Output --  Net 1100 ml   Weight change:  Exam:  General:  Pt is alert, follows commands appropriately, not in acute distress HEENT: No icterus, No thrush, No neck mass, [place]/AT Cardiovascular: RRR, S1/S2, no rubs, no gallops Respiratory: CTA bilaterally, no wheezing, no crackles, no rhonchi Abdomen: Soft/+BS, non tender, non distended, no guarding Extremities: No edema, No lymphangitis, No petechiae, No rashes, no synovitis Neuro:  CN II-XII intact, strength 4/5 in RUE, RLE, strength 4/5 LUE, LLE; sensation intact bilateral; no dysmetria; babinski equivocal    Data Reviewed: I have personally reviewed following labs and imaging studies Basic Metabolic Panel: Recent Labs  Lab 05/17/24 1845 05/18/24 0425  NA 131* 138  K 3.9 3.7  CL 97* 104  CO2 22 25  GLUCOSE 489* 238*  BUN 9 7*  CREATININE 1.16* 0.96  CALCIUM  8.9 8.8*  MG  --  1.8  PHOS  --  3.3   Liver Function Tests: Recent Labs  Lab 05/17/24 1845 05/18/24 0425   AST 13* 15  ALT 10 9  ALKPHOS 74 71  BILITOT 0.3 0.5  PROT 6.7 6.5  ALBUMIN  3.2* 3.0*   No results for input(s): LIPASE, AMYLASE in the last 168 hours. No results for input(s): AMMONIA in the last 168 hours. Coagulation Profile: No results for input(s): INR, PROTIME in the last 168 hours. CBC: Recent Labs  Lab 05/17/24 1845 05/18/24 0425  WBC 6.7 7.7  NEUTROABS 4.3  --   HGB 10.5* 10.7*  HCT 31.0* 31.3*  MCV 88.3 87.7  PLT 161 167   Cardiac Enzymes: No results for input(s): CKTOTAL, CKMB, CKMBINDEX, TROPONINI in the last 168 hours. BNP: Invalid input(s): POCBNP CBG: Recent Labs  Lab 05/17/24 1933 05/17/24 2142  GLUCAP 425* 304*   HbA1C: No results for input(s): HGBA1C in the last 72 hours. Urine analysis:    Component Value Date/Time   COLORURINE YELLOW 01/13/2024 0210   APPEARANCEUR Clear 05/06/2024 1600   LABSPEC 1.014 01/13/2024 0210   PHURINE 5.0 01/13/2024 0210   GLUCOSEU 3+ (A) 05/06/2024 1600   HGBUR MODERATE (A) 01/13/2024 0210   BILIRUBINUR Negative 05/06/2024 1600   KETONESUR 5 (A) 01/13/2024 0210   PROTEINUR 1+ (A) 05/06/2024 1600   PROTEINUR 30 (A)  01/13/2024 0210   UROBILINOGEN negative 10/11/2014 1109   UROBILINOGEN 2.0 (H) 08/14/2014 1157   NITRITE Negative 05/06/2024 1600   NITRITE NEGATIVE 01/13/2024 0210   LEUKOCYTESUR Negative 05/06/2024 1600   LEUKOCYTESUR NEGATIVE 01/13/2024 0210   Sepsis Labs: @LABRCNTIP (procalcitonin:4,lacticidven:4) )No results found for this or any previous visit (from the past 240 hours).   Scheduled Meds:  cyanocobalamin   1,000 mcg Intramuscular Weekly   cyanocobalamin   1,000 mcg Intramuscular Q30 days   enoxaparin  (LOVENOX ) injection  40 mg Subcutaneous Q24H   feeding supplement (GLUCERNA SHAKE)  237 mL Oral TID BM   insulin  aspart  0-5 Units Subcutaneous QHS   insulin  aspart  0-9 Units Subcutaneous TID WC   Continuous Infusions:  Procedures/Studies: CT Head Wo Contrast Result  Date: 05/17/2024 CLINICAL DATA:  Neuro deficit, acute, stroke suspected Seizure, HTN, slurred speech, but when ems arrived no slurred speech but pt's bp was 202/97. EXAM: CT HEAD WITHOUT CONTRAST TECHNIQUE: Contiguous axial images were obtained from the base of the skull through the vertex without intravenous contrast. RADIATION DOSE REDUCTION: This exam was performed according to the departmental dose-optimization program which includes automated exposure control, adjustment of the mA and/or kV according to patient size and/or use of iterative reconstruction technique. COMPARISON:  MRI head 01/14/2024, CT head 01/13/2024 FINDINGS: Brain: Cerebral ventricle sizes are concordant with the degree of cerebral volume loss. Patchy and confluent areas of decreased attenuation are noted throughout the deep and periventricular white matter of the cerebral hemispheres bilaterally, compatible with chronic microvascular ischemic disease. No evidence of large-territorial acute infarction. No parenchymal hemorrhage. No mass lesion. No extra-axial collection. No mass effect or midline shift. No hydrocephalus. Basilar cisterns are patent. Vascular: No hyperdense vessel. Atherosclerotic calcifications are present within the cavernous internal carotid and vertebral arteries. Skull: No acute fracture or focal lesion. Sinuses/Orbits: Paranasal sinuses and mastoid air cells are clear. Bilateral lens replacement. Otherwise the orbits are unremarkable. Other: None. IMPRESSION: No acute intracranial abnormality. Electronically Signed   By: Morgane  Naveau M.D.   On: 05/17/2024 19:36   CARDIAC EVENT MONITOR Result Date: 04/29/2024 Normal sinus rhythm Two runs of NSVT with the longest lasting 9 beats No sustained arrhythmias    Alm Schneider, DO  [facility]  If 7PM-7AM, please contact night-coverage [email or web address] Password [email or web address] 05/18/2024, 6:44 AM   LOS: 1 day

## 2024-05-18 NOTE — Hospital Course (Addendum)
 coronary artery disease status post CABG 02/19/23, hypertension, hyperlipidemia, diabetes mellitus type 2, TIA, depression, atrial tachycardia presenting with slurred speech.  History is supplemented by the patient's family.  While speaking with the patient's daughter on the phone, the patient had slurred speech.  EMS was activated.  They noted that the patient had SBP in the 200s.  By the time the patient arrived in the emergency department, her symptoms had resolved.  She was noted to have a serum glucose of 489 with normal anion gap.  She was started on IV fluids and IV insulin .  The patient subsequently developed slurred speech with left facial twitching and lipsmacking in the emergency department.  She was awake and alert at the time. The patient denies any new medications. Patient denies fevers, chills, headache, chest pain, dyspnea, nausea, vomiting, diarrhea, abdominal pain, dysuria, hematuria, hematochezia, and melena. Case was discussed with neurology, Dr. Arora.  He recommended transfer to [facility] for LTM EEG..   In the ED, the patient was afebrile and hemodynamically stable with oxygen saturation 94% room air.  Sodium 131, potassium 3.9, bicarbonate 22, serum creatinine 1.16.  Serum glucose 489.  Anion gap 12.  WBC 6.7, hemoglobin 10.5, platelets 161.  She had a hospital admission from 01/14/2024 to 01/16/2024 secondary to recurrent falls and seizure-like activity and altered mental status apparently the patient's family noted the patient to be talking gibberish and subsequently developed seizure-like activity. She was noted to have orthostatic hypotension.  MRI of the brain was negative for any acute abnormalities.  EEG was negative for seizure.  Her metabolic workup including serum TSH, RPR,  folate, UA were unremarkable.  She was noted to have serum B12 of 138 which was repleted.  She was discharged without any AEDs.

## 2024-05-18 NOTE — TOC Initial Note (Signed)
 Transition of Care [facility]) - Initial/Assessment Note    Patient Details  Name: [name] MRN: [id number] Date of Birth: Jan 20, 1951  [facility] CM/SW Contact:    Lucie Lunger, LCSWA Phone Number: 05/18/2024, 10:06 AM  Clinical Narrative:                 Pt is high risk for readmission. CSW spoke with pts daughter to complete assessment. Pt lives alone and is normally independent in completing her ADLs, she states pt has had more difficulty recently. Pt does not drive but family is able to provide transportation. Pt has had HH in the past, pts daughter is unsure of the agency. Pt has a cane and walker in the home to use when needed. TOC to follow.   Expected Discharge Plan: Home/Self Care Barriers to Discharge: Continued Medical Work up   Patient Goals and CMS Choice Patient states their goals for this hospitalization and ongoing recovery are:: get better CMS Medicare.gov Compare Post Acute Care list provided to:: Patient Choice offered to / list presented to : Patient, Adult Children      Expected Discharge Plan and Services In-house Referral: Clinical Social Work Discharge Planning Services: CM Consult   Living arrangements for the past 2 months: Single Family Home                                      Prior Living Arrangements/Services Living arrangements for the past 2 months: Single Family Home Lives with:: Self Patient language and need for interpreter reviewed:: Yes Do you feel safe going back to the place where you live?: Yes      Need for Family Participation in Patient Care: Yes (Comment) Care giver support system in place?: Yes (comment) Current home services: DME Criminal Activity/Legal Involvement Pertinent to Current Situation/Hospitalization: No - Comment as needed  Activities of Daily Living      Permission Sought/Granted                  Emotional Assessment Appearance:: Appears stated age Attitude/Demeanor/Rapport:  Engaged Affect (typically observed): Accepting   Alcohol / Substance Use: Not Applicable Psych Involvement: No (comment)  Admission diagnosis:  Seizures (HCC) [R56.9] Patient Active Problem List   Diagnosis Date Noted   Acute metabolic encephalopathy 05/18/2024   Chronic kidney disease, stage 3a (HCC) 05/18/2024   Seizures (HCC) 05/17/2024   Hypoalbuminemia due to protein-calorie malnutrition (HCC) 05/17/2024   Nonrheumatic mitral valve stenosis 02/03/2024   Type 2 diabetes mellitus with complication, without long-term current use of insulin  (HCC) 01/16/2024   Seizure-like activity (HCC) 01/14/2024   Clavicular fracture 01/14/2024   Hypertensive urgency 11/28/2023   Stage 3b chronic kidney disease (HCC) 10/09/2023   Vertigo 05/13/2023   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   Unstable angina (HCC) 02/12/2023   Incomplete emptying of bladder 02/20/2022   Restless leg syndrome 01/09/2021   Insomnia due to medical condition 01/09/2021   Congestive heart failure (HCC) 11/30/2020   Depression 11/02/2020   Recurrent UTI 10/11/2020   Generalized weakness 10/11/2020   Recurrent falls 10/11/2020   QT prolongation 10/11/2020   Osteopenia after menopause 12/13/2017   B12 deficiency 01/13/2016   History of stroke 09/01/2015   History of TIA (transient ischemic attack) 08/14/2014   Cataract 02/08/2014   Macular degeneration 02/08/2014   Accelerating angina (HCC) 01/07/2014   Generalized anxiety disorder 09/14/2013   Claudication (  HCC) 06/18/2013   Gastroesophageal reflux disease without esophagitis 01/01/2012   Hypokalemia 08/24/2011   Type 2 diabetes mellitus with hyperglycemia (HCC) 08/23/2011   Essential hypertension 08/23/2011   PALPITATIONS 02/10/2010   Mixed hyperlipidemia 05/10/2009   Coronary artery disease involving native coronary artery of native heart with unstable angina pectoris (HCC) 08/26/2008   PCP:  [name], FNP Pharmacy:   [facility] [place] [place] - [address] [place] [address] Phone: [phone] Fax: [phone]     Social Drivers of Health (SDOH) Social History: SDOH Screenings   Food Insecurity: No Food Insecurity (01/14/2024)  Housing: Low Risk  (01/14/2024)  Transportation Needs: No Transportation Needs (01/14/2024)  Utilities: Not At Risk (01/14/2024)  Alcohol Screen: Low Risk  (01/24/2023)  Depression (PHQ2-9): High Risk (05/06/2024)  Financial Resource Strain: Low Risk  (01/24/2023)  Physical Activity: Insufficiently Active (01/24/2023)  Social Connections: Moderately Integrated (01/14/2024)  Recent Concern: Social Connections - Moderately Isolated (11/29/2023)  Stress: No Stress Concern Present (01/24/2023)  Tobacco Use: Low Risk  (05/17/2024)   SDOH Interventions:     Readmission Risk Interventions    05/18/2024   10:04 AM  Readmission Risk Prevention Plan  Transportation Screening Complete  HRI or Home Care Consult Complete  Social Work Consult for Recovery Care Planning/Counseling Complete  Palliative Care Screening Not Applicable  Medication Review Oceanographer) Complete

## 2024-05-19 ENCOUNTER — Inpatient Hospital Stay (HOSPITAL_COMMUNITY)

## 2024-05-19 DIAGNOSIS — R569 Unspecified convulsions: Secondary | ICD-10-CM | POA: Diagnosis not present

## 2024-05-19 DIAGNOSIS — G40209 Localization-related (focal) (partial) symptomatic epilepsy and epileptic syndromes with complex partial seizures, not intractable, without status epilepticus: Secondary | ICD-10-CM | POA: Diagnosis not present

## 2024-05-19 LAB — GLUCOSE, CAPILLARY
Glucose-Capillary: 147 mg/dL — ABNORMAL HIGH (ref 70–99)
Glucose-Capillary: 189 mg/dL — ABNORMAL HIGH (ref 70–99)
Glucose-Capillary: 223 mg/dL — ABNORMAL HIGH (ref 70–99)
Glucose-Capillary: 227 mg/dL — ABNORMAL HIGH (ref 70–99)
Glucose-Capillary: 279 mg/dL — ABNORMAL HIGH (ref 70–99)

## 2024-05-19 MED ORDER — INSULIN GLARGINE 100 UNIT/ML ~~LOC~~ SOLN
24.0000 [IU] | Freq: Every day | SUBCUTANEOUS | Status: DC
  Filled 2024-05-19: qty 0.24

## 2024-05-19 MED ORDER — CYANOCOBALAMIN 1000 MCG/ML IJ SOLN: 1000.0000 ug | INTRAMUSCULAR | Status: DC

## 2024-05-19 MED ORDER — CYANOCOBALAMIN 1000 MCG/ML IJ SOLN: 1000.0000 ug | Freq: Every day | INTRAMUSCULAR | Status: DC

## 2024-05-19 MED ORDER — FAMOTIDINE 20 MG PO TABS
20.0000 mg | ORAL_TABLET | Freq: Two times a day (BID) | ORAL | Status: DC
  Administered 2024-05-19 – 2024-05-22 (×7): 20 mg via ORAL
  Filled 2024-05-19 (×7): qty 1

## 2024-05-19 MED ORDER — VITAMIN B-12 1000 MCG PO TABS
500.0000 ug | ORAL_TABLET | Freq: Every day | ORAL | Status: DC
  Administered 2024-05-20 – 2024-05-22 (×3): 500 ug via ORAL
  Filled 2024-05-19 (×4): qty 1

## 2024-05-19 NOTE — Consult Note (Signed)
 NEUROLOGY CONSULT NOTE   Date of service: May 19, 2024 Patient Name: [name] MRN:  [id number] DOB:  [birth year] Chief Complaint: concern for seizures Requesting Provider: Evonnie Lenis, MD  History of Present Illness  [name] is a 73 y.o. female with hx of CAD, HTN, HLD, DM2, PVD, GERD, who was brought into [name] by EMS for episodes of slurred speech and hypertension.  Patient was on the phone with her sister when she had sudden onset episode where her throat felt like it was closing, she felt short of breath and talking gibberish. Sister lives 2 blocks away and immediately drove over but the episode was resolving by then but she had a facial droop. EMS called and the episode resolved by the time EMS arrived. She was brought in to the ED and in the ED, noted to have a second episode with left facial twitching and lipsmacking that resolved spontaneously followed by slurred speech.  Had prior episode that were describde as talking gibberish. There were concerns that these could be seizures but was not started on AEDs. MRI Brain and rEEG at that time were negative.  Pt reports that she has been having these episodes for about a year now. They have been occurring more frequently and having about once a week now. Today, she had 2 at home and another in the ED.  The case was discussed wit neurology and recommended that patient be transferred to [facility] ED for LTM EEG.  Pt denies any prior hx of strokes, seizures. No family hx of seizure. No hx of stroke, ICH, no significant head injuries with LOC but does endorse multiple falls.  Does endorse difficulty with getting her glucose under control.   ROS  Comprehensive ROS performed and pertinent positives documented in HPI   Past History   Past Medical History:  Diagnosis Date   Anxiety    CAD (coronary artery disease)    DES to circumflex 02/2007, BMS to LAD and PTCA diagonal 03/2007   Carotid artery plaque    Mild    Cataract    Depression    Diverticulitis, colon    Elevated d-dimer 01/08/2014   Essential hypertension, benign    GERD (gastroesophageal reflux disease)    H/O hiatal hernia    HLD (hyperlipidemia)    IDDM (insulin  dependent diabetes mellitus)    Migraine    used to have them really bad; don't have them anymore (01/07/2014)   MS (multiple sclerosis) (HCC)    Not confirmed   PAT (paroxysmal atrial tachycardia) (HCC)    Prolapse of uterus    PVD (peripheral vascular disease) (HCC)    TIA (transient ischemic attack) 1980's    Past Surgical History:  Procedure Laterality Date   ABDOMINAL HYSTERECTOMY  1986   ovaries remain - prolaspe uterus    APPENDECTOMY  ~ 1970   BREAST BIOPSY Right 1980's   BREAST LUMPECTOMY Right 1980's   Dr. MYRTIS Salt    CARDIAC CATHETERIZATION  01/07/2014   CHOLECYSTECTOMY  ?1987   COLONOSCOPY  2002   Dr. Anwar--> Severe diverticular changes in the region of the sigmoid and descending colon with scattered diverticular changes throughout the rest of the colon. No polyps, ulcerations. Despite numerous manipulations, the tip of the scope could not be tipped into the cecal area.   COLONOSCOPY  01/10/2012   Procedure: COLONOSCOPY;  Surgeon: Lamar CHRISTELLA Hollingshead, MD;  [facility]: [facility];  Service: Endoscopy;  Laterality: N/A;  1:55  CORONARY ANGIOPLASTY WITH STENT PLACEMENT  ~ 1997 X 2   2 + 1   CORONARY ARTERY BYPASS GRAFT N/A 02/19/2023   Procedure: OFF PUMP CORONARY ARTERY BYPASS GRAFTING (CABG) X 1;  Surgeon: Shyrl Linnie KIDD, MD;  Location: [facility] OR;  Service: Open Heart Surgery;  Laterality: N/A;  LIMA TO LAD   CORONARY BALLOON ANGIOPLASTY N/A 10/05/2022   Procedure: CORONARY BALLOON ANGIOPLASTY;  Surgeon: Wonda Sharper, MD;  Location: [facility] INVASIVE CV LAB;  Service: Cardiovascular;  Laterality: N/A;   CORONARY PRESSURE/FFR STUDY N/A 03/08/2017   Procedure: Intravascular Pressure Wire/FFR Study;  Surgeon: Mady Bruckner, MD;  Location: [facility] INVASIVE CV LAB;   Service: Cardiovascular;  Laterality: N/A;   CORONARY STENT INTERVENTION N/A 03/26/2022   Procedure: CORONARY STENT INTERVENTION;  Surgeon: Court Dorn PARAS, MD;  Location: [facility] INVASIVE CV LAB;  Service: Cardiovascular;  Laterality: N/A;   EYE SURGERY Bilateral 2014   cataract   LEFT HEART CATH AND CORONARY ANGIOGRAPHY N/A 03/08/2017   Procedure: Left Heart Cath and Coronary Angiography;  Surgeon: Mady Bruckner, MD;  Location: [facility] INVASIVE CV LAB;  Service: Cardiovascular;  Laterality: N/A;   LEFT HEART CATH AND CORONARY ANGIOGRAPHY N/A 03/26/2022   Procedure: LEFT HEART CATH AND CORONARY ANGIOGRAPHY;  Surgeon: Court Dorn PARAS, MD;  Location: [facility] INVASIVE CV LAB;  Service: Cardiovascular;  Laterality: N/A;   LEFT HEART CATH AND CORONARY ANGIOGRAPHY N/A 10/05/2022   Procedure: LEFT HEART CATH AND CORONARY ANGIOGRAPHY;  Surgeon: Wonda Sharper, MD;  Location: [facility] INVASIVE CV LAB;  Service: Cardiovascular;  Laterality: N/A;   LEFT HEART CATH AND CORONARY ANGIOGRAPHY N/A 02/14/2023   Procedure: LEFT HEART CATH AND CORONARY ANGIOGRAPHY;  Surgeon: Swaziland, Peter M, MD;  Location: [facility] INVASIVE CV LAB;  Service: Cardiovascular;  Laterality: N/A;   LEFT HEART CATHETERIZATION WITH CORONARY ANGIOGRAM N/A 01/07/2014   Procedure: LEFT HEART CATHETERIZATION WITH CORONARY ANGIOGRAM;  Surgeon: Ezra GORMAN Shuck, MD;  Location: [facility] CATH LAB;  Service: Cardiovascular;  Laterality: N/A;   TEE WITHOUT CARDIOVERSION N/A 02/19/2023   Procedure: TRANSESOPHAGEAL ECHOCARDIOGRAM;  Surgeon: Shyrl Linnie KIDD, MD;  Location: [facility] OR;  Service: Open Heart Surgery;  Laterality: N/A;    Family History: Family History  Problem Relation Age of Onset   Heart attack Mother 17   Diabetes Mother    Hypertension Mother    Heart attack Father 58   Heart attack Brother 32       x 6   Heart disease Brother    Diabetes Brother    Colon cancer Paternal Aunt        22s, died with brain anuerysm   Crohn's disease Cousin        paternal    Diabetes Sister    GER disease Daughter    Cervical cancer Daughter    Diabetes Daughter     Social History  reports that she has never smoked. She has never used smokeless tobacco. She reports that she does not drink alcohol and does not use drugs.  Allergies  Allergen Reactions   Iohexol  Anaphylaxis and Nausea Only    Pt had syncopal episode with nausea post IV CM late 1990's,  pt has had prednisone  prep with heart caths x 2 without problem  kdean 04/16/07, Onset Date: 04-15-1996   Codeine Nausea And Vomiting and Palpitations   Metformin  And Related Diarrhea   Ticlid [Ticlopidine Hcl] Nausea And Vomiting   Jardiance  [Empagliflozin ] Other (See Comments)    Recurrent UTIs    Medications  Current Facility-Administered Medications:    acetaminophen  (TYLENOL ) tablet 650 mg, 650 mg, Oral, Q6H PRN **OR** acetaminophen  (TYLENOL ) suppository 650 mg, 650 mg, Rectal, Q6H PRN, Adefeso, Oladapo, DO   amLODipine  (NORVASC ) tablet 5 mg, 5 mg, Oral, Daily, Tat, David, MD, 5 mg at 05/18/24 2100   aspirin  chewable tablet 81 mg, 81 mg, Oral, Daily, Tat, David, MD, 81 mg at 05/18/24 9075   clopidogrel  (PLAVIX ) tablet 75 mg, 75 mg, Oral, Daily, Tat, David, MD, 75 mg at 05/18/24 9076   cyanocobalamin  (VITAMIN B12) tablet 500 mcg, 500 mcg, Oral, Daily, Tat, David, MD, 500 mcg at 05/18/24 9076   enoxaparin  (LOVENOX ) injection 40 mg, 40 mg, Subcutaneous, Q24H, Adefeso, Oladapo, DO, 40 mg at 05/18/24 2255   feeding supplement (GLUCERNA SHAKE) (GLUCERNA SHAKE) liquid 237 mL, 237 mL, Oral, TID BM, Adefeso, Oladapo, DO, 237 mL at 05/18/24 2255   hydrALAZINE  (APRESOLINE ) injection 10 mg, 10 mg, Intravenous, Q6H PRN, Adefeso, Oladapo, DO, 10 mg at 05/18/24 1902   hydrALAZINE  (APRESOLINE ) injection 5 mg, 5 mg, Intravenous, Once, Tat, David, MD   insulin  aspart (novoLOG ) injection 0-5 Units, 0-5 Units, Subcutaneous, QHS, Kommor, Madison, MD, 3 Units at 05/18/24 2254   insulin  aspart (novoLOG ) injection 0-9  Units, 0-9 Units, Subcutaneous, TID WC, Kommor, Madison, MD, 3 Units at 05/18/24 1810   insulin  glargine (LANTUS ) injection 20 Units, 20 Units, Subcutaneous, Daily, Tat, David, MD, 20 Units at 05/18/24 1121   isosorbide  mononitrate (IMDUR ) 24 hr tablet 60 mg, 60 mg, Oral, Daily, Tat, David, MD, 60 mg at 05/18/24 9076   metoprolol  tartrate (LOPRESSOR ) tablet 25 mg, 25 mg, Oral, BID, Tat, David, MD, 25 mg at 05/18/24 2256   prochlorperazine  (COMPAZINE ) injection 10 mg, 10 mg, Intravenous, Q6H PRN, Adefeso, Oladapo, DO   rosuvastatin  (CRESTOR ) tablet 20 mg, 20 mg, Oral, Daily, Tat, David, MD, 20 mg at 05/18/24 9075   sertraline  (ZOLOFT ) tablet 75 mg, 75 mg, Oral, Daily, Tat, David, MD, 75 mg at 05/18/24 0924  Vitals   Vitals:   05/18/24 1925 05/18/24 1930 05/18/24 1935 05/18/24 2256  BP: (!) 163/52   (!) 183/74  Pulse: 78 76 77 77  Resp:      Temp:    98.5 F (36.9 C)  TempSrc:    Axillary  SpO2: 96% 95% 96% 97%  Weight:      Height:        Body mass index is 25.98 kg/m.   Physical Exam   General: Laying comfortably in bed; in no acute distress.  HENT: Normal oropharynx and mucosa. Normal external appearance of ears and nose.  Neck: Supple, no pain or tenderness  CV: No JVD. No peripheral edema.  Pulmonary: Symmetric Chest rise. Normal respiratory effort.  Abdomen: Soft to touch, non-tender.  Ext: No cyanosis, edema, or deformity  Skin: No rash. Normal palpation of skin.   Musculoskeletal: Normal digits and nails by inspection. No clubbing.   Neurologic Examination  Mental status/Cognition: Alert, oriented to self, place, month and year, good attention.  Speech/language: Fluent, comprehension intact, object naming intact, repetition intact.  Cranial nerves:   CN II Pupils equal and reactive to light, no VF deficits    CN III,IV,VI EOM intact, no gaze preference or deviation, no nystagmus    CN V normal sensation in V1, V2, and V3 segments bilaterally    CN VII no asymmetry,  no nasolabial fold flattening    CN VIII normal hearing to speech    CN IX & X normal  palatal elevation, no uvular deviation    CN XI 5/5 head turn and 5/5 shoulder shrug bilaterally    CN XII midline tongue protrusion    Motor:  Muscle bulk: normal, tone normal Mvmt Root Nerve  Muscle Right Left Comments  SA C5/6 Ax Deltoid 4+ 4+   EF C5/6 Mc Biceps 4+ 4+   EE C6/7/8 Rad Triceps 4+ 4+   WF C6/7 Med FCR     WE C7/8 PIN ECU     F Ab C8/T1 U ADM/FDI 4+ 4+   HF L1/2/3 Fem Illopsoas 4+ 4+   KE L2/3/4 Fem Quad     DF L4/5 D Peron Tib Ant 4+ 4+   PF S1/2 Tibial Grc/Sol 4+ 4+    Reflexes:  Right Left Comments  Pectoralis      Biceps (C5/6) 2+ 2+   Brachioradialis (C5/6) 2+ 2+    Triceps (C6/7) 2+ 2+    Patellar (L3/4) 3 3    Achilles (S1) 2 2    Hoffman + +    Plantar withdraws withdraws   Jaw jerk    Sensation:  Light touch Intact throughout   Pin prick    Temperature    Vibration   Proprioception    Coordination/Complex Motor:  - Finger to Nose intact BL - Heel to shin intact BL - Rapid alternating movement are normal - Gait: deferred.   Labs/Imaging/Neurodiagnostic studies   CBC:  Recent Labs  Lab 2024-05-24 1845 05/18/24 0425  WBC 6.7 7.7  NEUTROABS 4.3  --   HGB 10.5* 10.7*  HCT 31.0* 31.3*  MCV 88.3 87.7  PLT 161 167   Basic Metabolic Panel:  Lab Results  Component Value Date   NA 138 05/18/2024   K 3.7 05/18/2024   CO2 25 05/18/2024   GLUCOSE 238 (H) 05/18/2024   BUN 7 (L) 05/18/2024   CREATININE 0.96 05/18/2024   CALCIUM  8.8 (L) 05/18/2024   GFRNONAA >60 05/18/2024   GFRAA 53 (L) 10/17/2020   Lipid Panel:  Lab Results  Component Value Date   LDLCALC 49 01/14/2024   HgbA1c:  Lab Results  Component Value Date   HGBA1C 8.0 (H) 02/28/2024   Urine Drug Screen:     Component Value Date/Time   LABOPIA NONE DETECTED 09/21/2017 2136   COCAINSCRNUR NONE DETECTED 09/21/2017 2136   LABBENZ POSITIVE (A) 09/21/2017 2136   AMPHETMU NONE  DETECTED 09/21/2017 2136   THCU NONE DETECTED 09/21/2017 2136   LABBARB NONE DETECTED 09/21/2017 2136    Alcohol Level     Component Value Date/Time   ETH <10 09/21/2017 2137   INR  Lab Results  Component Value Date   INR 1.2 02/19/2023   APTT  Lab Results  Component Value Date   APTT 31 02/19/2023   AED levels: No results found for: PHENYTOIN, ZONISAMIDE, LAMOTRIGINE, LEVETIRACETA  CT Head without contrast(Personally reviewed): CTH was negative for a large hypodensity concerning for a large territory infarct or hyperdensity concerning for an ICH  Prior MRI Brain from April 2025(Personally reviewed): 1. No evidence of an acute intracranial abnormality. 2. Mild-to-moderate chronic small vessel ischemic changes within the cerebral white matter and pons, similar to the prior brain MRI of 11/28/2023. 3. Mild-to-moderate generalized cerebral atrophy. 4. Mild right maxillary sinus disease, as described.  Neurodiagnostics LTM EEG:  Pending.  ASSESSMENT   [name] is a 73 y.o. female with hx of CAD, HTN, HLD, DM2, PVD, GERD, who was brought into [facility] ED by  EMS for episodes of garbled speech, slurred words and facial twitching and facial droop. Have been occurring over the last year and becoming more frequent and now about 1 a week, had 3 episodes in 24 hours.  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 Perhaps triggered by hyperglycemia but she was not significantly hyperglycemic when she had the event back in April of 2025.  I do think that it is reasonable to attempt to capture spells on LTM EEG given multiple events yesterday.  RECOMMENDATIONS  - LTM EEG for spell capture. - we will continue to follow along. ______________________________________________________________________  Plan discussed with patient in detail at the bedside.  Signed, Blondie Riggsbee, MD [facility]

## 2024-05-19 NOTE — Progress Notes (Signed)
 Neurology has been contacted (Salman Khaliqdina), about patient needing EEG. WCTM

## 2024-05-19 NOTE — Progress Notes (Signed)
 Leads reapplied. Impedance below 10kOhms, head wrap applied [facility] monitored, Event button test confirmed by [facility]. No skin breakdown seen.

## 2024-05-19 NOTE — Progress Notes (Signed)
 Notified by [facility] and patient nurse , several leads are off. EEG tech will reach out to ordering to determine if they need to be reapplied.

## 2024-05-19 NOTE — Procedures (Signed)
 Patient Name: [name]  MRN: [id number]  Epilepsy Attending: Arlin MALVA Krebs  Referring Physician/Provider: Khaliqdina, Salman, MD   Duration: 05/19/2024 0339 to 05/20/2024 0930  Patient history: 73 y.o. female who was brought into [facility] ED by EMS for episodes of garbled speech, slurred words and facial twitching and facial droop. Have been occurring over the last year and becoming more frequent and now about 1 a week, had 3 episodes in 24 hours. EEG to evaluate for seizure  Level of alertness: Awake, asleep  AEDs during EEG study: None  Technical aspects: This EEG study was done with scalp electrodes positioned according to the 10-20 International system of electrode placement. Electrical activity was reviewed with band pass filter of 1-70Hz , sensitivity of 7 uV/mm, display speed of 62mm/sec with a 60Hz  notched filter applied as appropriate. EEG data were recorded continuously and digitally stored.  Video monitoring was available and reviewed as appropriate.  Description: The posterior dominant rhythm consists of 8 Hz activity of moderate voltage (25-35 uV) seen predominantly in posterior head regions, symmetric and reactive to eye opening and eye closing. Sleep was characterized by vertex waves, sleep spindles (12 to 14 Hz), maximal frontocentral region. Hyperventilation and photic stimulation were not performed.     Parts of study were difficult to interpret due to significant electrode artifact. EEG was not interpretable between 05/19/2024 1632 to 2223   IMPRESSION: This technically difficult study is within normal limits. No seizures or epileptiform discharges were seen throughout the recording.  A normal interictal EEG does not exclude the diagnosis of epilepsy.   Erion Weightman O Peter Keyworth

## 2024-05-19 NOTE — Plan of Care (Signed)

## 2024-05-19 NOTE — Progress Notes (Signed)
 LTM EEG hooked up and running - no initial skin breakdown - push button tested - [facility] monitoring.

## 2024-05-19 NOTE — Progress Notes (Signed)
 PROGRESS NOTE  [name] FMW:[id number] DOB: 1951-04-14 DOA: 05/17/2024 PCP: [name], FNP  Brief History:   coronary artery disease status post CABG 02/19/23, hypertension, hyperlipidemia, diabetes mellitus type 2, TIA, depression, atrial tachycardia presenting with slurred speech.  History is supplemented by the patient's family.  While speaking with the patient's daughter on the phone, the patient had slurred speech.  EMS was activated.  They noted that the patient had SBP in the 200s.  By the time the patient arrived in the emergency department, her symptoms had resolved.  She was noted to have a serum glucose of 489 with normal anion gap.  She was started on IV fluids and IV insulin .  The patient subsequently developed slurred speech with left facial twitching and lipsmacking in the emergency department.  She was awake and alert at the time. The patient denies any new medications. Patient denies fevers, chills, headache, chest pain, dyspnea, nausea, vomiting, diarrhea, abdominal pain, dysuria, hematuria, hematochezia, and melena. Case was discussed with neurology, Dr. Arora.  He recommended transfer to [facility] for LTM EEG..     In the ED, the patient was afebrile and hemodynamically stable with oxygen saturation 94% room air.  Sodium 131, potassium 3.9, bicarbonate 22, serum creatinine 1.16.  Serum glucose 489.  Anion gap 12.  WBC 6.7, hemoglobin 10.5, platelets 161.   She had a hospital admission from 01/14/2024 to 01/16/2024 secondary to recurrent falls and seizure-like activity and altered mental status apparently the patient's family noted the patient to be talking gibberish and subsequently developed seizure-like activity. She was noted to have orthostatic hypotension.  MRI of the brain was negative for any acute abnormalities.  EEG was negative for seizure.  Her metabolic workup including serum TSH, RPR,  folate, UA were unremarkable.  She was noted to have serum B12 of 138  which was repleted.  She was discharged without any AEDs.   Assessment/Plan:  Seizure-like activity -Managed by neurology, he is on LTM EEG to rule out any seizure activity - It appears that the patient has had altered mental status and dizziness with seizure-like activity in each of the last 3 hospital admissions - She has presented with significant elevation of her serum glucose in the 400s and had required IV insulin  and IV fluid resuscitation - Certainly, there is concern that metabolic derangements may be contributing - Appreciate neurology eval  Hyperosmolar nonketotic state - Once again presented with serum glucose yesterday (05/17/24) 489 - Initially on IV insulin  IV fluids - Now transition back to [place] insulin , CBG uncontrolled, will increase Lantus  from 20 to 24 units. - 02/28/2024 hemoglobin A1c 8.0  Acute metabolic encephalopathy/slurred speech - Secondary to the patient's uncontrolled hyperglycemia, possible postictal state - Patient recently had a 30-day heart monitor with no dysrhythmias as noted by Dr. Lavona 04/29/24 - 05/06/2024 TSH 1.780 - 05/06/2024 serum B12 1130  Hypertensive urgency - Restart metoprolol  - Continue Imdur  - Restart metoprolol  - holding lisinopril  temporarily - Improved  B12 deficiency - Continue B12 supplementation - Level was 138 on/20 10/2023, up to 1130 on 05/06/2024  CKD stage IIIA - Baseline creatinine 1.0-1.2 - Monitor BMP  Coronary artery disease -Status post CABG 02/19/2023 -No chest pain presently -Continue aspirin  and Plavix  -continue imdur    Depression -Continue sertraline    Mixed hyperlipidemia -Continue statin  Social -pt lives alone, family lives near by -states she not infrequently misses his HS meds, but endorses compliance with insulin  -  endorses cheating with her diet--not any more than usual -seems to have poor insight into exact dose of insulin  she is suppose to take       Family Communication:   no Family  at bedside  Consultants:  neurology  Code Status:  FULL   DVT Prophylaxis:  [place] Lovenox    Procedures: As Listed in Progress Note Above  Antibiotics: None       Subjective:  Did report 1 episode of vomiting this morning, she denies any chest pain, shortness of breath, fever or chills.  Objective: Vitals:   05/18/24 2256 05/19/24 0442 05/19/24 0801 05/19/24 1130  BP: (!) 183/74 (!) 172/66 (!) 130/52 (!) 123/54  Pulse: 77 70    Resp:   18   Temp: 98.5 F (36.9 C) 98.4 F (36.9 C) 98.4 F (36.9 C) 98.3 F (36.8 C)  TempSrc: Axillary Oral Oral Oral  SpO2: 97% 95%    Weight:      Height:        Intake/Output Summary (Last 24 hours) at 05/19/2024 1353 Last data filed at 05/19/2024 0803 Gross per 24 hour  Intake 240 ml  Output --  Net 240 ml   Weight change:  Exam:   Awake Alert, Oriented X 3, No new F.N deficits, Normal affect Symmetrical Chest wall movement, Good air movement bilaterally, CTAB RRR,No Gallops,Rubs or new Murmurs, No Parasternal Heave +ve B.Sounds, Abd Soft, No tenderness, No rebound - guarding or rigidity. No Cyanosis, Clubbing or edema, No new Rash or bruise      Data Reviewed: I have personally reviewed following labs and imaging studies Basic Metabolic Panel: Recent Labs  Lab 05/17/24 1845 05/18/24 0425  NA 131* 138  K 3.9 3.7  CL 97* 104  CO2 22 25  GLUCOSE 489* 238*  BUN 9 7*  CREATININE 1.16* 0.96  CALCIUM  8.9 8.8*  MG  --  1.8  PHOS  --  3.3   Liver Function Tests: Recent Labs  Lab 05/17/24 1845 05/18/24 0425  AST 13* 15  ALT 10 9  ALKPHOS 74 71  BILITOT 0.3 0.5  PROT 6.7 6.5  ALBUMIN  3.2* 3.0*   No results for input(s): LIPASE, AMYLASE in the last 168 hours. No results for input(s): AMMONIA in the last 168 hours. Coagulation Profile: No results for input(s): INR, PROTIME in the last 168 hours. CBC: Recent Labs  Lab 05/17/24 1845 05/18/24 0425  WBC 6.7 7.7  NEUTROABS 4.3  --   HGB 10.5* 10.7*   HCT 31.0* 31.3*  MCV 88.3 87.7  PLT 161 167   Cardiac Enzymes: No results for input(s): CKTOTAL, CKMB, CKMBINDEX, TROPONINI in the last 168 hours. BNP: Invalid input(s): POCBNP CBG: Recent Labs  Lab 05/18/24 2252 05/18/24 2342 05/19/24 0408 05/19/24 0803 05/19/24 1132  GLUCAP 223* 216* 147* 279* 223*   HbA1C: No results for input(s): HGBA1C in the last 72 hours. Urine analysis:    Component Value Date/Time   COLORURINE STRAW (A) 05/18/2024 1116   APPEARANCEUR CLEAR 05/18/2024 1116   APPEARANCEUR Clear 05/06/2024 1600   LABSPEC 1.008 05/18/2024 1116   PHURINE 7.0 05/18/2024 1116   GLUCOSEU >=500 (A) 05/18/2024 1116   HGBUR MODERATE (A) 05/18/2024 1116   BILIRUBINUR NEGATIVE 05/18/2024 1116   BILIRUBINUR Negative 05/06/2024 1600   KETONESUR NEGATIVE 05/18/2024 1116   PROTEINUR NEGATIVE 05/18/2024 1116   UROBILINOGEN negative 10/11/2014 1109   UROBILINOGEN 2.0 (H) 08/14/2014 1157   NITRITE NEGATIVE 05/18/2024 1116   LEUKOCYTESUR NEGATIVE 05/18/2024 1116  Sepsis Labs: @LABRCNTIP (procalcitonin:4,lacticidven:4) )No results found for this or any previous visit (from the past 240 hours).   Scheduled Meds:  amLODipine   5 mg Oral Daily   aspirin   81 mg Oral Daily   clopidogrel   75 mg Oral Daily   vitamin B-12  500 mcg Oral Daily   enoxaparin  (LOVENOX ) injection  40 mg Subcutaneous Q24H   famotidine   20 mg Oral BID   feeding supplement (GLUCERNA SHAKE)  237 mL Oral TID BM   hydrALAZINE   5 mg Intravenous Once   insulin  aspart  0-5 Units Subcutaneous QHS   insulin  aspart  0-9 Units Subcutaneous TID WC   insulin  glargine  20 Units Subcutaneous Daily   isosorbide  mononitrate  60 mg Oral Daily   metoprolol  tartrate  25 mg Oral BID   rosuvastatin   20 mg Oral Daily   sertraline   75 mg Oral Daily   Continuous Infusions:  Procedures/Studies: Overnight EEG with video Result Date: 05/19/2024 Karen Arlin KIDD, MD     05/19/2024  8:44 AM Patient Name: [name] MRN: [id number] Epilepsy Attending: Arlin [name] Karen Referring Physician/Provider: Khaliqdina, Salman, MD  Duration: 05/19/2024 9660 to 0840 Patient history: 73 y.o. female who was brought into [name] by EMS for episodes of garbled speech, slurred words and facial twitching and facial droop. Have been occurring over the last year and becoming more frequent and now about 1 a week, had 3 episodes in 24 hours. EEG to evaluate for seizure Level of alertness: Awake, asleep AEDs during EEG study: None Technical aspects: This EEG study was done with scalp electrodes positioned according to the 10-20 International system of electrode placement. Electrical activity was reviewed with band pass filter of 1-70Hz , sensitivity of 7 uV/mm, display speed of 70mm/sec with a 60Hz  notched filter applied as appropriate. EEG data were recorded continuously and digitally stored.  Video monitoring was available and reviewed as appropriate. Description: The posterior dominant rhythm consists of 8 Hz activity of moderate voltage (25-35 uV) seen predominantly in posterior head regions, symmetric and reactive to eye opening and eye closing. Sleep was characterized by vertex waves, sleep spindles (12 to 14 Hz), maximal frontocentral region. Hyperventilation and photic stimulation were not performed.   Parts of study were difficult to interpret due to significant electrode artifact. IMPRESSION: This technically difficult study is within normal limits. No seizures or epileptiform discharges were seen throughout the recording. A normal interictal EEG does not exclude the diagnosis of epilepsy. Priyanka O Yadav   CT Head Wo Contrast Result Date: 05/17/2024 CLINICAL DATA:  Neuro deficit, acute, stroke suspected Seizure, HTN, slurred speech, but when ems arrived no slurred speech but pt's bp was 202/97. EXAM: CT HEAD WITHOUT CONTRAST TECHNIQUE: Contiguous axial images were obtained from the base of the skull through the vertex without  intravenous contrast. RADIATION DOSE REDUCTION: This exam was performed according to the departmental dose-optimization program which includes automated exposure control, adjustment of the mA and/or kV according to patient size and/or use of iterative reconstruction technique. COMPARISON:  MRI head 01/14/2024, CT head 01/13/2024 FINDINGS: Brain: Cerebral ventricle sizes are concordant with the degree of cerebral volume loss. Patchy and confluent areas of decreased attenuation are noted throughout the deep and periventricular white matter of the cerebral hemispheres bilaterally, compatible with chronic microvascular ischemic disease. No evidence of large-territorial acute infarction. No parenchymal hemorrhage. No mass lesion. No extra-axial collection. No mass effect or midline shift. No hydrocephalus. Basilar cisterns are patent. Vascular: No hyperdense vessel.  Atherosclerotic calcifications are present within the cavernous internal carotid and vertebral arteries. Skull: No acute fracture or focal lesion. Sinuses/Orbits: Paranasal sinuses and mastoid air cells are clear. Bilateral lens replacement. Otherwise the orbits are unremarkable. Other: None. IMPRESSION: No acute intracranial abnormality. Electronically Signed   By: Morgane  Naveau M.D.   On: 05/17/2024 19:36   CARDIAC EVENT MONITOR Result Date: 04/29/2024 Normal sinus rhythm Two runs of NSVT with the longest lasting 9 beats No sustained arrhythmias    Brayton Lye, MD  [facility]  If 7PM-7AM, please contact night-coverage [email or web address] Password [email or web address] 05/19/2024, 1:53 PM   LOS: 2 days

## 2024-05-20 ENCOUNTER — Inpatient Hospital Stay (HOSPITAL_COMMUNITY)

## 2024-05-20 DIAGNOSIS — R569 Unspecified convulsions: Secondary | ICD-10-CM | POA: Diagnosis not present

## 2024-05-20 DIAGNOSIS — N1831 Chronic kidney disease, stage 3a: Secondary | ICD-10-CM

## 2024-05-20 DIAGNOSIS — G9341 Metabolic encephalopathy: Secondary | ICD-10-CM | POA: Diagnosis not present

## 2024-05-20 DIAGNOSIS — I10 Essential (primary) hypertension: Secondary | ICD-10-CM | POA: Diagnosis not present

## 2024-05-20 LAB — GLUCOSE, CAPILLARY
Glucose-Capillary: 199 mg/dL — ABNORMAL HIGH (ref 70–99)
Glucose-Capillary: 283 mg/dL — ABNORMAL HIGH (ref 70–99)
Glucose-Capillary: 435 mg/dL — ABNORMAL HIGH (ref 70–99)
Glucose-Capillary: 391 mg/dL — ABNORMAL HIGH (ref 70–99)
Glucose-Capillary: 113 mg/dL — ABNORMAL HIGH (ref 70–99)

## 2024-05-20 LAB — BASIC METABOLIC PANEL WITH GFR
Anion gap: 10 (ref 5–15)
BUN: 11 mg/dL (ref 8–23)
CO2: 25 mmol/L (ref 22–32)
Calcium: 9.1 mg/dL (ref 8.9–10.3)
Chloride: 101 mmol/L (ref 98–111)
Creatinine, Ser: 1.1 mg/dL — ABNORMAL HIGH (ref 0.44–1.00)
GFR, Estimated: 52 mL/min — ABNORMAL LOW (ref >60)
Glucose, Bld: 192 mg/dL — ABNORMAL HIGH (ref 70–99)
Potassium: 3.6 mmol/L (ref 3.5–5.1)
Sodium: 136 mmol/L (ref 135–145)

## 2024-05-20 LAB — CBC
HCT: 35.4 % — ABNORMAL LOW (ref 36.0–46.0)
Hemoglobin: 11.8 g/dL — ABNORMAL LOW (ref 12.0–15.0)
MCH: 29.1 pg (ref 26.0–34.0)
MCHC: 33.3 g/dL (ref 30.0–36.0)
MCV: 87.2 fL (ref 80.0–100.0)
Platelets: 173 K/uL (ref 150–400)
RBC: 4.06 MIL/uL (ref 3.87–5.11)
RDW: 13.6 % (ref 11.5–15.5)
WBC: 8.1 K/uL (ref 4.0–10.5)
nRBC: 0 % (ref 0.0–0.2)

## 2024-05-20 MED ORDER — LISINOPRIL 5 MG PO TABS
10.0000 mg | ORAL_TABLET | Freq: Every day | ORAL | Status: DC
  Administered 2024-05-20 – 2024-05-22 (×3): 10 mg via ORAL
  Filled 2024-05-20 (×3): qty 2

## 2024-05-20 MED ORDER — INSULIN ASPART 100 UNIT/ML IJ SOLN
22.0000 [IU] | Freq: Once | INTRAMUSCULAR | Status: AC
  Administered 2024-05-20: 22 [IU] via SUBCUTANEOUS

## 2024-05-20 MED ORDER — METOPROLOL TARTRATE 50 MG PO TABS
50.0000 mg | ORAL_TABLET | Freq: Two times a day (BID) | ORAL | Status: DC
  Administered 2024-05-20 – 2024-05-22 (×4): 50 mg via ORAL
  Filled 2024-05-20 (×4): qty 1

## 2024-05-20 MED ORDER — INSULIN GLARGINE 100 UNIT/ML ~~LOC~~ SOLN
30.0000 [IU] | Freq: Every day | SUBCUTANEOUS | Status: DC
  Administered 2024-05-20: 30 [IU] via SUBCUTANEOUS
  Filled 2024-05-20 (×2): qty 0.3

## 2024-05-20 NOTE — Plan of Care (Signed)

## 2024-05-20 NOTE — TOC Progression Note (Signed)
 Transition of Care [facility]) - Progression Note    Patient Details  Name: [name] MRN: [id number] Date of Birth: June 15, 1951  [facility] CM/SW Contact  Marval Gell, RN Phone Number: 05/20/2024, 11:45 AM  Clinical Narrative:    [name] w patient at bedside. She has WC RWs, tub bench, 3/1, cane at home. Is supported by several children.  Recently had [facility], admitted with noncompliance for home mediations. Will reinstate HH RN for medication management and disease management with [facility].     Expected Discharge Plan: Home/Self Care Barriers to Discharge: Continued Medical Work up               Expected Discharge Plan and Services In-house Referral: Clinical Social Work Discharge Planning Services: CM Consult   Living arrangements for the past 2 months: Single Family Home                                       Social Drivers of Health (SDOH) Interventions SDOH Screenings   Food Insecurity: No Food Insecurity (05/19/2024)  Housing: Low Risk  (05/19/2024)  Transportation Needs: No Transportation Needs (05/19/2024)  Utilities: Not At Risk (05/19/2024)  Alcohol Screen: Low Risk  (01/24/2023)  Depression (PHQ2-9): High Risk (05/06/2024)  Financial Resource Strain: Low Risk  (01/24/2023)  Physical Activity: Insufficiently Active (01/24/2023)  Social Connections: Moderately Integrated (05/19/2024)  Stress: No Stress Concern Present (01/24/2023)  Tobacco Use: Low Risk  (05/17/2024)    Readmission Risk Interventions    05/18/2024   10:04 AM  Readmission Risk Prevention Plan  Transportation Screening Complete  HRI or Home Care Consult Complete  Social Work Consult for Recovery Care Planning/Counseling Complete  Palliative Care Screening Not Applicable  Medication Review Ocean[employer]) Complete

## 2024-05-20 NOTE — Progress Notes (Addendum)
 PROGRESS NOTE        PATIENT DETAILS Name: MAIZIE GARNO Age: 73 y.o. Sex: female Date of Birth: Jun 25, 1951 Admit Date: 05/17/2024 Admitting Physician Posey Maier, DO ERE:[id number], [name], FNP  Brief Summary: Patient is a 73 y.o.  female with history of CAD s/p CABG May 2024, DM-2, HLD, HTN who presented with altered mental status/slurred speech/left facial twitching and hyperglycemic-hyperosmolar nonketotic state.    Significant events: 8/24>> admit to [facility] 8/26>> transferred to [facility]  Significant studies: 8/24>> CT head: No acute abnormality. 8/26-8/27>> LTM EEG: No seizures  Significant microbiology data: None  Procedures: None  Consults: Neurology  Subjective: Awake/alert-no obvious seizure-like activity overnight.  No major issues overnight.  No complaints this morning.  Objective: Vitals: Blood pressure (!) 180/69, pulse 67, temperature 98.1 F (36.7 C), temperature source Oral, resp. rate 14, height 5' 6 (1.676 m), weight 73 kg, SpO2 96%.   Exam: Gen Exam:Alert awake-not in any distress HEENT:atraumatic, normocephalic Chest: B/L clear to auscultation anteriorly CVS:S1S2 regular Abdomen:soft non tender, non distended Extremities:no edema Neurology: Non focal Skin: no rash  Pertinent Labs/Radiology:    Latest Ref Rng & Units 05/20/2024    5:09 AM 05/18/2024    4:25 AM 05/17/2024    6:45 PM  CBC  WBC 4.0 - 10.5 K/uL 8.1  7.7  6.7   Hemoglobin 12.0 - 15.0 g/dL 88.1  89.2  89.4   Hematocrit 36.0 - 46.0 % 35.4  31.3  31.0   Platelets 150 - 400 K/uL 173  167  161     Lab Results  Component Value Date   NA 136 05/20/2024   K 3.6 05/20/2024   CL 101 05/20/2024   CO2 25 05/20/2024      Assessment/Plan: Possible seizure-like activity LTM EEG ongoing No seizure activity noted Not on AEDs Await further recommendations from neurology.  Hyperosmolar hyperglycemic nonketotic state Initially on IV insulin -has been  transitioned to SQ insulin   Acute metabolic encephalopathy Secondary to hypoglycemia Resolved.  Completely awake/alert this morning  DM-2 (A1c 8.0 on 6/6) with uncontrolled hyperglycemia CBGs still on the higher side Increase Semglee  to 30 units daily Continue SSI Follow/optimize Counseled regarding importance of compliance to meals/medications.  HTN BP remains uncontrolled Resume lisinopril  Increase metoprolol  to 50 mg twice daily Continue Imdur  Follow/optimize  HLD Statin  CAD s/p CABG No current anginal symptoms Continue antiplatelet/beta-blocker/statin  CKD stage IIIa Close to baseline  Depression Stable Zoloft   Code status:   Code Status: Full Code   DVT Prophylaxis: enoxaparin  (LOVENOX ) injection 40 mg Start: 05/17/24 2215 SCDs Start: 05/17/24 2204   Family Communication: Daughter-Crystal-[phone] - updated 8/27   Disposition Plan: Status is: Inpatient Remains inpatient appropriate because: Severity of illness   Planned Discharge Destination:Home   Diet: Diet Order             Diet Carb Modified Fluid consistency: Thin  Diet effective now                     Antimicrobial agents: Anti-infectives (From admission, onward)    None        MEDICATIONS: Scheduled Meds:  amLODipine   5 mg Oral Daily   aspirin   81 mg Oral Daily   clopidogrel   75 mg Oral Daily   vitamin B-12  500 mcg Oral Daily   enoxaparin  (LOVENOX ) injection  40  mg Subcutaneous Q24H   famotidine   20 mg Oral BID   feeding supplement (GLUCERNA SHAKE)  237 mL Oral TID BM   hydrALAZINE   5 mg Intravenous Once   insulin  aspart  0-5 Units Subcutaneous QHS   insulin  aspart  0-9 Units Subcutaneous TID WC   insulin  glargine  30 Units Subcutaneous Daily   isosorbide  mononitrate  60 mg Oral Daily   metoprolol  tartrate  25 mg Oral BID   rosuvastatin   20 mg Oral Daily   sertraline   75 mg Oral Daily   Continuous Infusions: PRN Meds:.acetaminophen  **OR** acetaminophen ,  hydrALAZINE , prochlorperazine    I have personally reviewed following labs and imaging studies  LABORATORY DATA: CBC: Recent Labs  Lab 05/17/24 1845 05/18/24 0425 05/20/24 0509  WBC 6.7 7.7 8.1  NEUTROABS 4.3  --   --   HGB 10.5* 10.7* 11.8*  HCT 31.0* 31.3* 35.4*  MCV 88.3 87.7 87.2  PLT 161 167 173    Basic Metabolic Panel: Recent Labs  Lab 05/17/24 1845 05/18/24 0425 05/20/24 0509  NA 131* 138 136  K 3.9 3.7 3.6  CL 97* 104 101  CO2 22 25 25   GLUCOSE 489* 238* 192*  BUN 9 7* 11  CREATININE 1.16* 0.96 1.10*  CALCIUM  8.9 8.8* 9.1  MG  --  1.8  --   PHOS  --  3.3  --     GFR: Estimated Creatinine Clearance: 45.2 mL/min (A) (by C-G formula based on SCr of 1.1 mg/dL (H)).  Liver Function Tests: Recent Labs  Lab 05/17/24 1845 05/18/24 0425  AST 13* 15  ALT 10 9  ALKPHOS 74 71  BILITOT 0.3 0.5  PROT 6.7 6.5  ALBUMIN  3.2* 3.0*   No results for input(s): LIPASE, AMYLASE in the last 168 hours. No results for input(s): AMMONIA in the last 168 hours.  Coagulation Profile: No results for input(s): INR, PROTIME in the last 168 hours.  Cardiac Enzymes: No results for input(s): CKTOTAL, CKMB, CKMBINDEX, TROPONINI in the last 168 hours.  BNP (last 3 results) No results for input(s): PROBNP in the last 8760 hours.  Lipid Profile: No results for input(s): CHOL, HDL, LDLCALC, TRIG, CHOLHDL, LDLDIRECT in the last 72 hours.  Thyroid  Function Tests: No results for input(s): TSH, T4TOTAL, FREET4, T3FREE, THYROIDAB in the last 72 hours.  Anemia Panel: No results for input(s): VITAMINB12, FOLATE, FERRITIN, TIBC, IRON, RETICCTPCT in the last 72 hours.  Urine analysis:    Component Value Date/Time   COLORURINE STRAW (A) 05/18/2024 1116   APPEARANCEUR CLEAR 05/18/2024 1116   APPEARANCEUR Clear 05/06/2024 1600   LABSPEC 1.008 05/18/2024 1116   PHURINE 7.0 05/18/2024 1116   GLUCOSEU >=500 (A) 05/18/2024 1116    HGBUR MODERATE (A) 05/18/2024 1116   BILIRUBINUR NEGATIVE 05/18/2024 1116   BILIRUBINUR Negative 05/06/2024 1600   KETONESUR NEGATIVE 05/18/2024 1116   PROTEINUR NEGATIVE 05/18/2024 1116   UROBILINOGEN negative 10/11/2014 1109   UROBILINOGEN 2.0 (H) 08/14/2014 1157   NITRITE NEGATIVE 05/18/2024 1116   LEUKOCYTESUR NEGATIVE 05/18/2024 1116    Sepsis Labs: Lactic Acid, Venous    Component Value Date/Time   LATICACIDVEN 1.6 11/28/2023 1837    MICROBIOLOGY: No results found for this or any previous visit (from the past 240 hours).  RADIOLOGY STUDIES/RESULTS: Overnight EEG with video Result Date: 05/19/2024 Shelton Arlin KIDD, MD     05/20/2024  9:40 AM Patient Name: DANNON PERLOW MRN: [id number] Epilepsy Attending: Arlin KIDD Shelton Referring Physician/Provider: Khaliqdina, Salman, MD  Duration: 05/19/2024 [phone]  to 05/20/2024 0930 Patient history: 73 y.o. female who was brought into [facility] ED by EMS for episodes of garbled speech, slurred words and facial twitching and facial droop. Have been occurring over the last year and becoming more frequent and now about 1 a week, had 3 episodes in 24 hours. EEG to evaluate for seizure Level of alertness: Awake, asleep AEDs during EEG study: None Technical aspects: This EEG study was done with scalp electrodes positioned according to the 10-20 International system of electrode placement. Electrical activity was reviewed with band pass filter of 1-70Hz , sensitivity of 7 uV/mm, display speed of 63mm/sec with a 60Hz  notched filter applied as appropriate. EEG data were recorded continuously and digitally stored.  Video monitoring was available and reviewed as appropriate. Description: The posterior dominant rhythm consists of 8 Hz activity of moderate voltage (25-35 uV) seen predominantly in posterior head regions, symmetric and reactive to eye opening and eye closing. Sleep was characterized by vertex waves, sleep spindles (12 to 14 Hz), maximal frontocentral  region. Hyperventilation and photic stimulation were not performed.   Parts of study were difficult to interpret due to significant electrode artifact. EEG was not interpretable between 05/19/2024 1632 to 2223 IMPRESSION: This technically difficult study is within normal limits. No seizures or epileptiform discharges were seen throughout the recording. A normal interictal EEG does not exclude the diagnosis of epilepsy. Priyanka MALVA Krebs     LOS: 3 days   Donalda Applebaum, MD  [facility]    To contact the attending provider between 7A-7P or the covering provider during after hours 7P-7A, please log into the web site [email or web address] and access using universal [place] password for that web site. If you do not have the password, please call the hospital operator.  05/20/2024, 10:55 AM

## 2024-05-20 NOTE — Evaluation (Signed)
 Occupational Therapy Evaluation Patient Details Name: [name] MRN: [id number] DOB: Jan 30, 1951 Today's Date: 05/20/2024   History of Present Illness   Pt is a 73 y.o. female who presented 05/17/24 with intermittent confusion and slurred speech. SBP in 200s and serum glucose of 489 with normal anion gap. In the ED, noted to have an episode of left facial twitching and lipsmacking that resolved spontaneously followed by slurred speech. Ct head negative. Pt admitted in hyperglycemic-hyperosmolar nonketotic state. 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 She primarily furniture walks and endorses multiple falls. Pt presents with confusion and paranoia in the setting of elevated blood sugar. Per daughter, pt's blood sugar frequently runs high, but pt does not have confusion. Pt is overall functioning at a set up to supervision level. She will need close supervision when she returns home should her cognition remain impaired. Will follow acutely. Recommending HHOT.      If plan is discharge home, recommend the following:   Supervision due to cognitive status     Functional Status Assessment   Patient has had a recent decline in their functional status and demonstrates the ability to make significant improvements in function in a reasonable and predictable amount of time.     Equipment Recommendations   None recommended by OT     Recommendations for Other Services         Precautions/Restrictions   Precautions Precautions: Fall;Other (comment) (continuous EEG) Recall of Precautions/Restrictions: Impaired Restrictions Weight Bearing Restrictions Per Provider Order: No     Mobility Bed Mobility               General bed mobility comments: received in chair    Transfers Overall transfer level: Needs assistance Equipment used: None Transfers: Sit  to/from Stand Sit to Stand: Supervision                  Balance Overall balance assessment: Mild deficits observed, not formally tested, History of Falls                                         ADL either performed or assessed with clinical judgement   ADL Overall ADL's : Needs assistance/impaired Eating/Feeding: Independent;Sitting   Grooming: Standing;Supervision/safety   Upper Body Bathing: Set up;Sitting   Lower Body Bathing: Sit to/from stand;Supervison/ safety   Upper Body Dressing : Set up;Sitting   Lower Body Dressing: Sit to/from stand;Supervision/safety   Toilet Transfer: Stand-pivot;BSC/3in1;Supervision/safety                   Vision Baseline Vision/History: 1 Wears glasses Ability to See in Adequate Light: 0 Adequate Patient Visual Report: No change from baseline       Perception         Praxis         Pertinent Vitals/Pain Pain Assessment Pain Assessment: Faces Faces Pain Scale: No hurt     Extremity/Trunk Assessment Upper Extremity Assessment Upper Extremity Assessment: Overall WFL for tasks assessed;Right hand dominant   Lower Extremity Assessment Lower Extremity Assessment: Defer to PT evaluation   Cervical / Trunk Assessment Cervical / Trunk Assessment: Normal   Communication Communication Communication: No apparent difficulties   Cognition Arousal: Alert Behavior During Therapy: Flat affect, Lability Cognition: Cognition impaired   Orientation  impairments: Situation, Place Awareness: Intellectual awareness impaired, Online awareness impaired Memory impairment (select all impairments): Declarative long-term memory, Short-term memory Attention impairment (select first level of impairment): Sustained attention Executive functioning impairment (select all impairments): Problem solving, Reasoning OT - Cognition Comments: pt with paranoia, thinks she was moved to a different room and wants to move back,  tearful                 Following commands: Intact       Cueing  General Comments   Cueing Techniques: Verbal cues      Exercises     Shoulder Instructions      Home Living Family/patient expects to be discharged to:: Private residence Living Arrangements: Alone Available Help at Discharge: Family;Available PRN/intermittently Type of Home: Apartment Home Access: Elevator     Home Layout: One level     Bathroom Shower/Tub: Chief Strategy Officer: Handicapped height     Home Equipment: Rollator (4 wheels);Cane - Programmer, applications (2 wheels);Wheelchair - manual;Tub bench;Grab bars - toilet;Grab bars - tub/shower;BSC/3in1;Other (comment)   Additional Comments: bedrail      Prior Functioning/Environment Prior Level of Function : Independent/Modified Independent             Mobility Comments: furniture walks, many falls ADLs Comments: independent in self care, per chart pt with poor understanding of medical management    OT Problem List: Decreased cognition   OT Treatment/Interventions: Self-care/ADL training;Cognitive remediation/compensation;Patient/family education;DME and/or AE instruction      OT Goals(Current goals can be found in the care plan section)   Acute Rehab OT Goals OT Goal Formulation: With patient Time For Goal Achievement: 06/03/24 Potential to Achieve Goals: Good ADL Goals Additional ADL Goal #1: Pt will complete basic ADLs mod I. Additional ADL Goal #2: Pt will participate in cognitive screening tool.   OT Frequency:  Min 2X/week    Co-evaluation              AM-PAC OT 6 Clicks Daily Activity     Outcome Measure Help from another person eating meals?: None Help from another person taking care of personal grooming?: A Little Help from another person toileting, which includes using toliet, bedpan, or urinal?: A Little Help from another person bathing (including washing, rinsing, drying)?: A  Little Help from another person to put on and taking off regular upper body clothing?: None Help from another person to put on and taking off regular lower body clothing?: A Little 6 Click Score: 20   End of Session Nurse Communication: Other (comment) (aware pt is confused)  Activity Tolerance: Treatment limited secondary to agitation Patient left: in chair;with call bell/phone within reach;with nursing/sitter in room  OT Visit Diagnosis: Other symptoms and signs involving cognitive function                Time: 1445-1500 OT Time Calculation (min): 15 min Charges:  OT General Charges $OT Visit: 1 Visit OT Evaluation $OT Eval Moderate Complexity: 1 Mod  Mliss HERO, OTR/L Acute Rehabilitation Services Office: [phone]   Kennth Mliss Helling 05/20/2024, 3:44 PM

## 2024-05-20 NOTE — Plan of Care (Signed)
 Neurology plan of care  Per [facility], patient is back to baseline. LTM EEG WNL (no spells captured). If nothing epileptiform overnight, OK to d/c LTM in AM. Neurology will follow up at that time with final recs.  Karen Ross, MD [facility] [phone]  If 7pm- 7am, please page neurology on call as listed in [facility]ION.

## 2024-05-20 NOTE — Evaluation (Signed)
 Physical Therapy Evaluation Patient Details Name: [name] MRN: [id number] DOB: May 09, 1951 Today's Date: 05/20/2024  History of Present Illness  Pt is a 73 y.o. female who presented 05/17/24 with intermittent confusion and slurred speech. SBP in 200s and serum glucose of 489 with normal anion gap. In the ED, noted to have an episode of left facial twitching and lipsmacking that resolved spontaneously followed by slurred speech. Ct head negative. Pt admitted in hyperglycemic-hyperosmolar nonketotic state. 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 PTA, she was mod I holding onto furniture for functional mobility, living alone in a 1-level apartment with an elevator level access. The pt endorses x5 falls in the past 6 months. She currently displays deficits in balance, gross strength, and endurance that place her at risk for subsequent falls. She did sway anterior <> posterior intermittently and spontaneously when ambulating, but only required CGA-supervision for all functional mobility this date. She did not use an AD when ambulating today. Pt typically calls on Alexa, her in-home device, if she needs assistance or to call for help. Encouraged use of RW at d/c and to have a charged phone or a life alert button on her at all times for safety due to her risk for falls in case she is unable to speak to call Alexa to call for assistance. She verbalized understanding. As pt appears to be approaching her baseline, recommending pt d/c home and follow-up with HHPT upon d/c to reduce her risk for subsequent falls. Will continue to follow acutely.        If plan is discharge home, recommend the following: Assistance with cooking/housework;Assist for transportation   Can travel by private vehicle        Equipment Recommendations None recommended by PT   Recommendations for Other Services       Functional Status Assessment Patient has had a recent decline in their functional status and demonstrates the ability to make significant improvements in function in a reasonable and predictable amount of time.     Precautions / Restrictions Precautions Precautions: Fall;Other (comment) Recall of Precautions/Restrictions: Intact Precaution/Restrictions Comments: EEG, HTN Restrictions Weight Bearing Restrictions Per Provider Order: No      Mobility  Bed Mobility Overal bed mobility: Needs Assistance Bed Mobility: Supine to Sit     Supine to sit: Contact guard, HOB elevated, Used rails     General bed mobility comments: To simulate home and her bed rail there, pt used PT's arm as a rail to pull up on with her R UE to ascend her trunk to sit up R EOB. Pt unable to sit up without use of simulated rail despite attempts due to R shoulder issues. CGA for safety    Transfers Overall transfer level: Needs assistance Equipment used: None Transfers: Sit to/from Stand Sit to Stand: Supervision           General transfer comment: Pt able to stand from EOB without LOB with supervision for safety, no LOB    Ambulation/Gait Ambulation/Gait assistance: Contact guard assist Gait Distance (Feet): 60 Feet Assistive device: None Gait Pattern/deviations: Step-through pattern, Decreased step length - right, Decreased step length - left, Decreased stride length Gait velocity: reduced Gait velocity interpretation: <1.31 ft/sec, indicative of household ambulator   General Gait Details: Pt intermittently sways anterio <> posterior, but recovers with reactional strategies and CGA for safety. Distance  limited by EEG line.  Stairs            Wheelchair Mobility     Tilt Bed    Modified Rankin (Stroke Patients Only) Modified Rankin (Stroke Patients Only) Pre-Morbid Rankin Score: Slight disability Modified Rankin: Slight disability      Balance Overall balance assessment: Mild deficits observed, not formally tested, History of Falls                                           Pertinent Vitals/Pain Pain Assessment Pain Assessment: Faces Faces Pain Scale: Hurts little more Pain Location: R upper arm (chronic since fx in April, has since been cleared by MD for activity as tolerated per pt) Pain Descriptors / Indicators: Discomfort, Grimacing, Guarding, Sore Pain Intervention(s): Limited activity within patient's tolerance, Monitored during session, Repositioned    Home Living Family/patient expects to be discharged to:: Private residence Living Arrangements: Alone Available Help at Discharge: Family;Available PRN/intermittently Type of Home: Apartment Home Access: Elevator       Home Layout: One level Home Equipment: Rollator (4 wheels);Cane - Programmer, applications (2 wheels);Wheelchair - manual;Tub bench;Grab bars - toilet;Grab bars - tub/shower;BSC/3in1;Other (comment) (bed rail)      Prior Function Prior Level of Function : Independent/Modified Independent             Mobility Comments: Has had x5 falls in past 6 months when legs give out or trips. She fell and fractured her R humerus in April (since has been cleared by MD per pt for activity as tolerated) due to an issue with her walker. She usually is mod I holding onto furniture for support       Extremity/Trunk Assessment   Upper Extremity Assessment Upper Extremity Assessment: Defer to OT evaluation    Lower Extremity Assessment Lower Extremity Assessment: Generalized weakness;RLE deficits/detail;LLE deficits/detail RLE Deficits / Details: hx of peripheral neuropathy impacting sensation in bil feet RLE Sensation: history of peripheral neuropathy LLE Deficits / Details: hx of peripheral neuropathy impacting sensation in bil feet LLE Sensation: history of peripheral neuropathy    Cervical / Trunk Assessment Cervical / Trunk  Assessment: Normal  Communication   Communication Communication: No apparent difficulties    Cognition Arousal: Alert Behavior During Therapy: WFL for tasks assessed/performed   PT - Cognitive impairments: No apparent impairments                       PT - Cognition Comments: A&Ox4 Following commands: Intact       Cueing Cueing Techniques: Verbal cues     General Comments General comments (skin integrity, edema, etc.): Encouraged use of RW at d/c and to have a charged phone or a life alert button on her at all times for safety due to her risk for falls in case she is unable to speak to call Alexa to call for assistance. She verbalized understanding.    Exercises     Assessment/Plan    PT Assessment Patient needs continued PT services  PT Problem List Decreased strength;Decreased activity tolerance;Decreased balance;Decreased mobility;Impaired sensation       PT Treatment Interventions DME instruction;Gait training;Functional mobility training;Therapeutic activities;Therapeutic exercise;Balance training;Neuromuscular re-education;Patient/family education    PT Goals (Current goals can be found in the Care Plan section)  Acute Rehab PT Goals Patient Stated Goal: to improve and go home PT Goal Formulation: With patient Time  For Goal Achievement: 06/03/24 Potential to Achieve Goals: Good    Frequency Min 2X/week     Co-evaluation               AM-PAC PT 6 Clicks Mobility  Outcome Measure Help needed turning from your back to your side while in a flat bed without using bedrails?: A Little Help needed moving from lying on your back to sitting on the side of a flat bed without using bedrails?: A Little Help needed moving to and from a bed to a chair (including a wheelchair)?: A Little Help needed standing up from a chair using your arms (e.g., wheelchair or bedside chair)?: A Little Help needed to walk in hospital room?: A Little Help needed climbing  3-5 steps with a railing? : A Lot 6 Click Score: 17    End of Session Equipment Utilized During Treatment: Gait belt Activity Tolerance: Patient tolerated treatment well Patient left: in chair;with call bell/phone within reach;with chair alarm set Nurse Communication: Mobility status PT Visit Diagnosis: Unsteadiness on feet (R26.81);Other abnormalities of gait and mobility (R26.89);Repeated falls (R29.6);History of falling (Z91.81);Muscle weakness (generalized) (M62.81);Difficulty in walking, not elsewhere classified (R26.2)    Time: 1015-1040 PT Time Calculation (min) (ACUTE ONLY): 25 min   Charges:   PT Evaluation $PT Eval Moderate Complexity: 1 Mod PT Treatments $Therapeutic Activity: 8-22 mins PT General Charges $$ ACUTE PT VISIT: 1 Visit         Theo Ferretti, PT, DPT Acute Rehabilitation Services  Office: [phone]   Theo CHRISTELLA Ferretti 05/20/2024, 12:27 PM

## 2024-05-20 NOTE — Progress Notes (Signed)
 EEG maint complete. No SBD at p3 p4 and fp1 had a little redness so displaced slightly

## 2024-05-21 ENCOUNTER — Inpatient Hospital Stay (HOSPITAL_COMMUNITY)

## 2024-05-21 DIAGNOSIS — N1831 Chronic kidney disease, stage 3a: Secondary | ICD-10-CM | POA: Diagnosis not present

## 2024-05-21 DIAGNOSIS — I10 Essential (primary) hypertension: Secondary | ICD-10-CM | POA: Diagnosis not present

## 2024-05-21 DIAGNOSIS — G9341 Metabolic encephalopathy: Secondary | ICD-10-CM | POA: Diagnosis not present

## 2024-05-21 DIAGNOSIS — G40209 Localization-related (focal) (partial) symptomatic epilepsy and epileptic syndromes with complex partial seizures, not intractable, without status epilepticus: Secondary | ICD-10-CM | POA: Diagnosis not present

## 2024-05-21 DIAGNOSIS — R569 Unspecified convulsions: Secondary | ICD-10-CM | POA: Diagnosis not present

## 2024-05-21 LAB — GLUCOSE, CAPILLARY
Glucose-Capillary: 148 mg/dL — ABNORMAL HIGH (ref 70–99)
Glucose-Capillary: 243 mg/dL — ABNORMAL HIGH (ref 70–99)
Glucose-Capillary: 237 mg/dL — ABNORMAL HIGH (ref 70–99)
Glucose-Capillary: 105 mg/dL — ABNORMAL HIGH (ref 70–99)
Glucose-Capillary: 211 mg/dL — ABNORMAL HIGH (ref 70–99)

## 2024-05-21 MED ORDER — INSULIN ASPART 100 UNIT/ML IJ SOLN
0.0000 [IU] | Freq: Three times a day (TID) | INTRAMUSCULAR | Status: DC
  Administered 2024-05-21 – 2024-05-22 (×3): 5 [IU] via SUBCUTANEOUS

## 2024-05-21 MED ORDER — INSULIN ASPART 100 UNIT/ML IJ SOLN
4.0000 [IU] | Freq: Three times a day (TID) | INTRAMUSCULAR | Status: DC
  Administered 2024-05-21 – 2024-05-22 (×4): 4 [IU] via SUBCUTANEOUS

## 2024-05-21 MED ORDER — ORAL CARE MOUTH RINSE: 15.0000 mL | OROMUCOSAL | Status: DC | PRN

## 2024-05-21 MED ORDER — INSULIN GLARGINE 100 UNIT/ML ~~LOC~~ SOLN
34.0000 [IU] | Freq: Every day | SUBCUTANEOUS | Status: DC
  Administered 2024-05-21: 34 [IU] via SUBCUTANEOUS
  Filled 2024-05-21 (×2): qty 0.34

## 2024-05-21 NOTE — Progress Notes (Signed)
 Occupational Therapy Treatment Patient Details Name: [name] MRN: [id number] DOB: 01-08-1951 Today's Date: 05/21/2024   History of present illness Pt is a 73 y.o. female who presented 05/17/24 with intermittent confusion and slurred speech. SBP in 200s and serum glucose of 489 with normal anion gap. In the ED, noted to have an episode of left facial twitching and lipsmacking that resolved spontaneously followed by slurred speech. Ct head negative. Pt admitted in hyperglycemic-hyperosmolar nonketotic state. 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 Ambulated and completed ADLs addressed today with supervision for safety and lines. Pt reports drinking 2 sugared sodas a day, does not like diet drinks. Reports frequent fatigue and mostly watches TV all day, leaving it on the same Roku channel. Pt able to use her smart phone to show pictures of her great granddaughter, unable to figure out how to use hospital's tv remote. Pt reports she does not have family who can stay with her when she returns home. Continues to demonstrate decreased awareness of deficits. Questionable ability to manage medications and blood sugar checks without assistance.       If plan is discharge home, recommend the following:  Supervision due to cognitive status   Equipment Recommendations  None recommended by OT    Recommendations for Other Services      Precautions / Restrictions Precautions Precautions: Fall Recall of Precautions/Restrictions: Impaired Restrictions Weight Bearing Restrictions Per Provider Order: No       Mobility Bed Mobility Overal bed mobility: Needs Assistance Bed Mobility: Supine to Sit, Sit to Supine     Supine to sit: Supervision Sit to supine: Supervision   General bed mobility comments: HOB flat, supervision for lines and cues to don socks before ambulation    Transfers Overall transfer  level: Needs assistance Equipment used: None Transfers: Sit to/from Stand Sit to Stand: Supervision           General transfer comment: Pt able to stand from EOB and toilet without LOB with supervision for safety, no LOB     Balance Overall balance assessment: Mild deficits observed, not formally tested, History of Falls                                         ADL either performed or assessed with clinical judgement   ADL Overall ADL's : Needs assistance/impaired     Grooming: Wash/dry hands;Standing;Supervision/safety               Lower Body Dressing: Set up;Sitting/lateral leans Lower Body Dressing Details (indicate cue type and reason): donned and doffed socks Toilet Transfer: Ambulation;Supervision/safety   Toileting- Clothing Manipulation and Hygiene: Supervision/safety;Sit to/from stand       Functional mobility during ADLs: Supervision/safety;Minimal assistance      Extremity/Trunk Assessment              Vision       Perception     Praxis     Communication Communication Communication: No apparent difficulties   Cognition Arousal: Alert Behavior During Therapy: WFL for tasks assessed/performed Cognition: Cognition impaired   Orientation impairments: Time Awareness: Intellectual awareness impaired, Online awareness impaired Memory impairment (select all impairments): Short-term memory Attention impairment (select first level of impairment): Sustained attention Executive functioning impairment (select all impairments): Problem solving  Following commands: Intact        Cueing   Cueing Techniques: Verbal cues  Exercises      Shoulder Instructions       General Comments      Pertinent Vitals/ Pain       Pain Assessment Pain Assessment: Faces Faces Pain Scale: Hurts a little bit Pain Location: head Pain Descriptors / Indicators: Aching Pain Intervention(s): Patient requesting pain  meds-RN notified  Home Living                                          Prior Functioning/Environment              Frequency  Min 2X/week        Progress Toward Goals  OT Goals(current goals can now be found in the care plan section)  Progress towards OT goals: Progressing toward goals  Acute Rehab OT Goals OT Goal Formulation: With patient Time For Goal Achievement: 06/03/24 Potential to Achieve Goals: Good  Plan      Co-evaluation                 AM-PAC OT 6 Clicks Daily Activity     Outcome Measure   Help from another person eating meals?: None Help from another person taking care of personal grooming?: A Little Help from another person toileting, which includes using toliet, bedpan, or urinal?: A Little Help from another person bathing (including washing, rinsing, drying)?: A Little Help from another person to put on and taking off regular upper body clothing?: None Help from another person to put on and taking off regular lower body clothing?: None 6 Click Score: 21    End of Session Equipment Utilized During Treatment: Gait belt  OT Visit Diagnosis: Other symptoms and signs involving cognitive function   Activity Tolerance Patient tolerated treatment well   Patient Left in bed;with call bell/phone within reach;with bed alarm set   Nurse Communication Mobility status        Time: 9075-9056 OT Time Calculation (min): 19 min  Charges: OT General Charges $OT Visit: 1 Visit OT Treatments $Self Care/Home Management : 8-22 mins  Mliss HERO, OTR/L Acute Rehabilitation Services Office: [phone]   Kennth Mliss Helling 05/21/2024, 10:05 AM

## 2024-05-21 NOTE — Plan of Care (Signed)

## 2024-05-21 NOTE — Procedures (Signed)
 Patient Name: [name]  MRN: [id number]  Epilepsy Attending: Arlin MALVA Krebs  Referring Physician/Provider: Khaliqdina, Salman, MD   Duration: 05/20/2024 0930 to 05/21/2024 0055   Patient history: 73 y.o. female who was brought into [facility] ED by EMS for episodes of garbled speech, slurred words and facial twitching and facial droop. Have been occurring over the last year and becoming more frequent and now about 1 a week, had 3 episodes in 24 hours. EEG to evaluate for seizure   Level of alertness: Awake, asleep   AEDs during EEG study: None   Technical aspects: This EEG study was done with scalp electrodes positioned according to the 10-20 International system of electrode placement. Electrical activity was reviewed with band pass filter of 1-70Hz , sensitivity of 7 uV/mm, display speed of 30mm/sec with a 60Hz  notched filter applied as appropriate. EEG data were recorded continuously and digitally stored.  Video monitoring was available and reviewed as appropriate.   Description: The posterior dominant rhythm consists of 8 Hz activity of moderate voltage (25-35 uV) seen predominantly in posterior head regions, symmetric and reactive to eye opening and eye closing. Sleep was characterized by vertex waves, sleep spindles (12 to 14 Hz), maximal frontocentral region. Hyperventilation and photic stimulation were not performed.     Parts of study were difficult to interpret due to significant electrode artifact.    IMPRESSION: This technically difficult study is within normal limits. No seizures or epileptiform discharges were seen throughout the recording.   A normal interictal EEG does not exclude the diagnosis of epilepsy.     Tobin Cadiente O Shaliah Wann

## 2024-05-21 NOTE — Progress Notes (Signed)
 Patient has pulled off most of her leads. Will take her off LTM EEG.  Emalene Welte [facility]

## 2024-05-21 NOTE — Care Management Important Message (Signed)
 Important Message  Patient Details  Name: [name] MRN: [id number] Date of Birth: 1951-09-09   Important Message Given:  Yes - Medicare IM     Claretta Deed 05/21/2024, 12:32 PM

## 2024-05-21 NOTE — Progress Notes (Signed)
 LTM VIDEO EEG discontinued - no skin breakdown at unhook-redness on forehead. Patient removed leads.  EEG tech removed equipment and wiped paste /glue from hair.

## 2024-05-21 NOTE — Progress Notes (Signed)
 PROGRESS NOTE        PATIENT DETAILS Name: [name] Age: 73 y.o. Sex: female Date of Birth: [birth year] Admit Date: 05/17/2024 Admitting Physician Posey Maier, DO ERE:[id number], [name], FNP  Brief Summary: Patient is a 73 y.o.  female with history of CAD s/p CABG May 2024, DM-2, HLD, HTN who presented with altered mental status/slurred speech/left facial twitching and hyperglycemic-hyperosmolar nonketotic state.    Significant events: 8/24>> admit to [facility] 8/26>> transferred to [facility]  Significant studies: 8/24>> CT head: No acute abnormality. 8/26-8/27>> LTM EEG: No seizures 8/27-8/28>> LTM EEG: No seizures.  Significant microbiology data: None  Procedures: None  Consults: Neurology  Subjective: Some sundowning last night but much more awake and alert today.  Objective: Vitals: Blood pressure (!) 153/58, pulse 74, temperature 98.1 F (36.7 C), temperature source Oral, resp. rate (!) 23, height 5' 6 (1.676 m), weight 73 kg, SpO2 95%.   Exam: Gen Exam:Alert awake-not in any distress HEENT:atraumatic, normocephalic Chest: B/L clear to auscultation anteriorly CVS:S1S2 regular Abdomen:soft non tender, non distended Extremities:no edema Neurology: Non focal Skin: no rash  Pertinent Labs/Radiology:    Latest Ref Rng & Units 05/20/2024    5:09 AM 05/18/2024    4:25 AM 05/17/2024    6:45 PM  CBC  WBC 4.0 - 10.5 K/uL 8.1  7.7  6.7   Hemoglobin 12.0 - 15.0 g/dL 88.1  89.2  89.4   Hematocrit 36.0 - 46.0 % 35.4  31.3  31.0   Platelets 150 - 400 K/uL 173  167  161     Lab Results  Component Value Date   NA 136 05/20/2024   K 3.6 05/20/2024   CL 101 05/20/2024   CO2 25 05/20/2024      Assessment/Plan: Possible seizure-like activity LTM EEG negative x 2 days. No seizure activity noted Not on AEDs Await further recommendations from neurology.  Acute metabolic encephalopathy Secondary to hyperglycemia Improved/resolved-mostly  awake and alert this morning-did have some mild delirium overnight.  Hyperosmolar hyperglycemic nonketotic state Initially on IV insulin -has been transitioned to SQ insulin   DM-2 (A1c 8.0 on 6/6) with uncontrolled hyperglycemia CBGs in the 400s range yesterday Increase Lantus  to 34 units Increase Premeal NovoLog  to 4 units Continue SSI Continue to counsel regarding importance of compliance to meals/insulin . Follow/optimize   Recent Labs    05/20/24 2151 05/21/24 0400 05/21/24 0921  GLUCAP 113* 148* 243*     HTN BP better Continue lisinopril /metoprolol /Imdur  Follow/optimize.    HLD Statin  CAD s/p CABG No current anginal symptoms Continue antiplatelet/beta-blocker/statin  CKD stage IIIa Close to baseline  Depression Stable Zoloft   Code status:   Code Status: Full Code   DVT Prophylaxis: enoxaparin  (LOVENOX ) injection 40 mg Start: 05/17/24 2215 SCDs Start: 05/17/24 2204   Family Communication: Daughter-[name] - updated 8/27   Disposition Plan: Status is: Inpatient Remains inpatient appropriate because: Severity of illness   Planned Discharge Destination:Home   Diet: Diet Order             Diet Carb Modified Fluid consistency: Thin  Diet effective now                     Antimicrobial agents: Anti-infectives (From admission, onward)    None        MEDICATIONS: Scheduled Meds:  amLODipine   5 mg Oral Daily  aspirin   81 mg Oral Daily   clopidogrel   75 mg Oral Daily   vitamin B-12  500 mcg Oral Daily   enoxaparin  (LOVENOX ) injection  40 mg Subcutaneous Q24H   famotidine   20 mg Oral BID   feeding supplement (GLUCERNA SHAKE)  237 mL Oral TID BM   hydrALAZINE   5 mg Intravenous Once   insulin  aspart  0-15 Units Subcutaneous TID WC   insulin  aspart  0-5 Units Subcutaneous QHS   insulin  aspart  4 Units Subcutaneous TID WC   insulin  glargine  34 Units Subcutaneous Daily   isosorbide  mononitrate  60 mg Oral Daily    lisinopril   10 mg Oral Daily   metoprolol  tartrate  50 mg Oral BID   rosuvastatin   20 mg Oral Daily   sertraline   75 mg Oral Daily   Continuous Infusions: PRN Meds:.acetaminophen  **OR** acetaminophen , hydrALAZINE , mouth rinse, prochlorperazine    I have personally reviewed following labs and imaging studies  LABORATORY DATA: CBC: Recent Labs  Lab 05/17/24 1845 05/18/24 0425 05/20/24 0509  WBC 6.7 7.7 8.1  NEUTROABS 4.3  --   --   HGB 10.5* 10.7* 11.8*  HCT 31.0* 31.3* 35.4*  MCV 88.3 87.7 87.2  PLT 161 167 173    Basic Metabolic Panel: Recent Labs  Lab 05/17/24 1845 05/18/24 0425 05/20/24 0509  NA 131* 138 136  K 3.9 3.7 3.6  CL 97* 104 101  CO2 22 25 25   GLUCOSE 489* 238* 192*  BUN 9 7* 11  CREATININE 1.16* 0.96 1.10*  CALCIUM  8.9 8.8* 9.1  MG  --  1.8  --   PHOS  --  3.3  --     GFR: Estimated Creatinine Clearance: 45.2 mL/min (A) (by C-G formula based on SCr of 1.1 mg/dL (H)).  Liver Function Tests: Recent Labs  Lab 05/17/24 1845 05/18/24 0425  AST 13* 15  ALT 10 9  ALKPHOS 74 71  BILITOT 0.3 0.5  PROT 6.7 6.5  ALBUMIN  3.2* 3.0*   No results for input(s): LIPASE, AMYLASE in the last 168 hours. No results for input(s): AMMONIA in the last 168 hours.  Coagulation Profile: No results for input(s): INR, PROTIME in the last 168 hours.  Cardiac Enzymes: No results for input(s): CKTOTAL, CKMB, CKMBINDEX, TROPONINI in the last 168 hours.  BNP (last 3 results) No results for input(s): PROBNP in the last 8760 hours.  Lipid Profile: No results for input(s): CHOL, HDL, LDLCALC, TRIG, CHOLHDL, LDLDIRECT in the last 72 hours.  Thyroid  Function Tests: No results for input(s): TSH, T4TOTAL, FREET4, T3FREE, THYROIDAB in the last 72 hours.  Anemia Panel: No results for input(s): VITAMINB12, FOLATE, FERRITIN, TIBC, IRON, RETICCTPCT in the last 72 hours.  Urine analysis:    Component Value Date/Time    COLORURINE STRAW (A) 05/18/2024 1116   APPEARANCEUR CLEAR 05/18/2024 1116   APPEARANCEUR Clear 05/06/2024 1600   LABSPEC 1.008 05/18/2024 1116   PHURINE 7.0 05/18/2024 1116   GLUCOSEU >=500 (A) 05/18/2024 1116   HGBUR MODERATE (A) 05/18/2024 1116   BILIRUBINUR NEGATIVE 05/18/2024 1116   BILIRUBINUR Negative 05/06/2024 1600   KETONESUR NEGATIVE 05/18/2024 1116   PROTEINUR NEGATIVE 05/18/2024 1116   UROBILINOGEN negative 10/11/2014 1109   UROBILINOGEN 2.0 (H) 08/14/2014 1157   NITRITE NEGATIVE 05/18/2024 1116   LEUKOCYTESUR NEGATIVE 05/18/2024 1116    Sepsis Labs: Lactic Acid, Venous    Component Value Date/Time   LATICACIDVEN 1.6 11/28/2023 1837    MICROBIOLOGY: No results found for this or any  previous visit (from the past 240 hours).  RADIOLOGY STUDIES/RESULTS: No results found.    LOS: 4 days   Donalda Applebaum, MD  [facility]    To contact the attending provider between 7A-7P or the covering provider during after hours 7P-7A, please log into the web site [email or web address] and access using universal  password for that web site. If you do not have the password, please call the hospital operator.  05/21/2024, 11:13 AM

## 2024-05-21 NOTE — Progress Notes (Signed)
 Patient found wandering in room after removing all medical devices/wires, confused to time and situation demanding to go home.  Telemetry leads applied and patient encouraged to stay in bed. Vitals stable -- BP 156/57, HR 74.  FSBS 148.  Bed alarm activated.  Charge nurse made aware of event and EEG department notified.

## 2024-05-22 ENCOUNTER — Telehealth: Payer: Self-pay | Admitting: Family Medicine

## 2024-05-22 ENCOUNTER — Other Ambulatory Visit (HOSPITAL_COMMUNITY): Payer: Self-pay

## 2024-05-22 DIAGNOSIS — I10 Essential (primary) hypertension: Secondary | ICD-10-CM | POA: Diagnosis not present

## 2024-05-22 DIAGNOSIS — R569 Unspecified convulsions: Secondary | ICD-10-CM | POA: Diagnosis not present

## 2024-05-22 DIAGNOSIS — G9341 Metabolic encephalopathy: Secondary | ICD-10-CM | POA: Diagnosis not present

## 2024-05-22 DIAGNOSIS — I25110 Atherosclerotic heart disease of native coronary artery with unstable angina pectoris: Secondary | ICD-10-CM | POA: Diagnosis not present

## 2024-05-22 LAB — GLUCOSE, CAPILLARY: Glucose-Capillary: 229 mg/dL — ABNORMAL HIGH (ref 70–99)

## 2024-05-22 MED ORDER — INSULIN GLARGINE 100 UNIT/ML ~~LOC~~ SOLN
38.0000 [IU] | Freq: Every day | SUBCUTANEOUS | Status: DC
  Administered 2024-05-22: 38 [IU] via SUBCUTANEOUS
  Filled 2024-05-22: qty 0.38

## 2024-05-22 MED ORDER — LINAGLIPTIN 5 MG PO TABS
5.0000 mg | ORAL_TABLET | Freq: Every day | ORAL | 2 refills | Status: AC
  Filled 2024-05-22: qty 30, 30d supply, fill #0

## 2024-05-22 NOTE — Plan of Care (Signed)
  Problem: Clinical Measurements: Goal: Respiratory complications will improve Outcome: Progressing   Problem: Activity: Goal: Risk for activity intolerance will decrease Outcome: Progressing   Problem: Nutrition: Goal: Adequate nutrition will be maintained Outcome: Progressing   

## 2024-05-22 NOTE — Telephone Encounter (Signed)
 Verbal given to wait.

## 2024-05-22 NOTE — Discharge Summary (Signed)
 PATIENT DETAILS Name: [name] Age: 73 y.o. Sex: female Date of Birth: [birth year] MRN: [id number]. Admitting Physician: Karen Maier, DO ERE:Fnmhjw, Karen HERO, FNP  Admit Date: 05/17/2024 Discharge date: 05/22/2024  Recommendations for Outpatient Follow-up:  Follow up with PCP in 1-2 weeks Please obtain CMP/CBC in one week  Admitted From:  Home  Disposition: Home health   Discharge Condition: good  CODE STATUS:   Code Status: Full Code   Diet recommendation:  Diet Order             Diet - low sodium heart healthy           Diet Carb Modified           Diet Carb Modified Fluid consistency: Thin  Diet effective now                    Brief Summary: Patient is a 73 y.o.  female with history of CAD s/p CABG May 2024, DM-2, HLD, HTN who presented with altered mental status/slurred speech/left facial twitching and hyperglycemic-hyperosmolar nonketotic state.     Significant events: 8/24>> admit to [facility] 8/26>> transferred to [facility]   Significant studies: 8/24>> CT head: No acute abnormality. 8/26-8/27>> LTM EEG: No seizures 8/27-8/28>> LTM EEG: No seizures.   Significant microbiology data: None   Procedures: None   Consults: Neurology  Brief Hospital Course: Possible seizure-like activity LTM EEG negative x 2 days. No seizure activity noted Per neurology-episodes could be partial complex seizures Recommendations are to start Keppra  to see if this reduces spell frequency. Patient aware of driving restrictions/seizure precautions-see below.   Acute metabolic encephalopathy Secondary to hyperglycemia Resolved-completely awake/alert.   Hyperosmolar hyperglycemic nonketotic state Initially on IV insulin -has been transitioned to SQ insulin  Suspect etiology was secondary to noncompliance with diet primarily-unlikely missing a few doses of insulin  as well.   DM-2 (A1c 8.0 on 6/6) with uncontrolled hyperglycemia CBGs now stable Continue usual  outpatient regimen-40 units of Tresiba  daily along with SSI, have added Tradjenta  (has had poor tolerance to metformin  and Jardiance ) Has been counseled extensively regarding compliance to meal/insulin  PCP to follow/optimize.    HTN BP better Continue lisinopril /metoprolol /Imdur  Follow/optimize.     HLD Statin   CAD s/p CABG No current anginal symptoms Continue antiplatelet/beta-blocker/statin   CKD stage IIIa Close to baseline   Depression Stable Zoloft     Discharge Diagnoses:  Principal Problem:   Seizures (HCC) Active Problems:   Coronary artery disease involving native coronary artery of native heart with unstable angina pectoris (HCC)   Essential hypertension   Mixed hyperlipidemia   Type 2 diabetes mellitus with hyperglycemia (HCC)   Depression   Hypertensive urgency   Hypoalbuminemia due to protein-calorie malnutrition (HCC)   Acute metabolic encephalopathy   Chronic kidney disease, stage 3a (HCC)   Discharge Instructions:  Activity:  As tolerated with Full fall precautions use walker/cane & assistance as needed  Discharge Instructions     Ambulatory referral to Neurology   Complete by: As directed    An appointment is requested in approximately: 4 wks   Call MD for:  difficulty breathing, headache or visual disturbances   Complete by: As directed    Call MD for:  extreme fatigue   Complete by: As directed    Call MD for:  persistant dizziness or light-headedness   Complete by: As directed    Diet - low sodium heart healthy   Complete by: As directed    Diet Carb Modified  Complete by: As directed    Discharge instructions   Complete by: As directed    Follow with Primary MD  Karen Karen HERO, FNP in 1-2 weeks  Check your blood sugars multiple times a day-keep a record of these readings and take it to your next appointment with your primary care practitioner  Please get a complete blood count and chemistry panel checked by your Primary MD at  your next visit, and again as instructed by your Primary MD.  Get Medicines reviewed and adjusted: Please take all your medications with you for your next visit with your Primary MD  Laboratory/radiological data: Please request your Primary MD to go over all hospital tests and procedure/radiological results at the follow up, please ask your Primary MD to get all Hospital records sent to his/her office.  In some cases, they will be blood work, cultures and biopsy results pending at the time of your discharge. Please request that your primary care M.D. follows up on these results.  Also Note the following: If you experience worsening of your admission symptoms, develop shortness of breath, life threatening emergency, suicidal or homicidal thoughts you must seek medical attention immediately by calling 911 or calling your MD immediately  if symptoms less severe.  You must read complete instructions/literature along with all the possible adverse reactions/side effects for all the Medicines you take and that have been prescribed to you. Take any new Medicines after you have completely understood and accpet all the possible adverse reactions/side effects.   Do not drive when taking Pain medications or sleeping medications (Benzodaizepines)  Do not take more than prescribed Pain, Sleep and Anxiety Medications. It is not advisable to combine anxiety,sleep and pain medications without talking with your primary care practitioner  Special Instructions: If you have smoked or chewed Tobacco  in the last 2 yrs please stop smoking, stop any regular Alcohol  and or any Recreational drug use.  Wear Seat belts while driving.  Please note: You were cared for by a hospitalist during your hospital stay. Once you are discharged, your primary care physician will handle any further medical issues. Please note that NO REFILLS for any discharge medications will be authorized once you are discharged, as it is  imperative that you return to your primary care physician (or establish a relationship with a primary care physician if you do not have one) for your post hospital discharge needs so that they can reassess your need for medications and monitor your lab values.     Seizure precautions: Per [place]  DMV statutes, patients with seizures are not allowed to drive until they have been seizure-free for six months and cleared by a physician    Use caution when using heavy equipment or power tools. Avoid working on ladders or at heights. Take showers instead of baths. Ensure the water  temperature is not too high on the home water  heater. Do not go swimming alone. Do not lock yourself in a room alone (i.e. bathroom). When caring for infants or small children, sit down when holding, feeding, or changing them to minimize risk of injury to the child in the event you have a seizure. Maintain good sleep hygiene. Avoid alcohol.    If patient has another seizure, call 911 and bring them back to the ED if: A.  The seizure lasts longer than 5 minutes.      B.  The patient doesn't wake shortly after the seizure or has new problems such as difficulty seeing, speaking  or moving following the seizure C.  The patient was injured during the seizure D.  The patient has a temperature over 102 F (39C) E.  The patient vomited during the seizure and now is having trouble breathing    During the Seizure   - First, ensure adequate ventilation and place patients on the floor on their left side  Loosen clothing around the neck and ensure the airway is patent. If the patient is clenching the teeth, do not force the mouth open with any object as this can cause severe damage - Remove all items from the surrounding that can be hazardous. The patient may be oblivious to what's happening and may not even know what he or she is doing. If the patient is confused and wandering, either gently guide him/her away and block access to  outside areas - Reassure the individual and be comforting - Call 911. In most cases, the seizure ends before EMS arrives. However, there are cases when seizures may last over 3 to 5 minutes. Or the individual may have developed breathing difficulties or severe injuries. If a pregnant patient or a person with diabetes develops a seizure, it is prudent to call an ambulance. - Finally, if the patient does not regain full consciousness, then call EMS. Most patients will remain confused for about 45 to 90 minutes after a seizure, so you must use judgment in calling for help. - Avoid restraints but make sure the patient is in a bed with padded side rails - Place the individual in a lateral position with the neck slightly flexed; this will help the saliva drain from the mouth and prevent the tongue from falling backward - Remove all nearby furniture and other hazards from the area - Provide verbal assurance as the individual is regaining consciousness - Provide the patient with privacy if possible - Call for help and start treatment as ordered by the caregiver    After the Seizure (Postictal Stage)   After a seizure, most patients experience confusion, fatigue, muscle pain and/or a headache. Thus, one should permit the individual to sleep. For the next few days, reassurance is essential. Being calm and helping reorient the person is also of importance.   Most seizures are painless and end spontaneously. Seizures are not harmful to others but can lead to complications such as stress on the lungs, brain and the heart. Individuals with prior lung problems may develop labored breathing and respiratory distress.    Increase activity slowly   Complete by: As directed       Allergies as of 05/22/2024       Reactions   Iohexol  Anaphylaxis, Nausea Only   Pt had syncopal episode with nausea post IV CM late 1990's,  pt has had prednisone  prep with heart caths x 2 without problem  kdean 04/16/07, Onset Date:  04-15-1996   Codeine Nausea And Vomiting, Palpitations   Metformin  And Related Diarrhea   Ticlid [ticlopidine Hcl] Nausea And Vomiting   Jardiance  [empagliflozin ] Other (See Comments)   Recurrent UTIs        Medication List     TAKE these medications    acetaminophen  500 MG tablet Commonly known as: TYLENOL  Take 1,000 mg by mouth every 6 (six) hours as needed for mild pain (pain score 1-3) or headache.   aspirin  81 MG chewable tablet Chew 1 tablet (81 mg total) by mouth daily.   Blood Glucose Monitoring Suppl Devi 1 each by Does not apply route in the morning,  at noon, and at bedtime. May substitute to any manufacturer covered by patient's insurance.   clopidogrel  75 MG tablet Commonly known as: PLAVIX  Take 1 tablet (75 mg total) by mouth daily with breakfast.   famotidine  20 MG tablet Commonly known as: Pepcid  Take 1 tablet (20 mg total) by mouth 2 (two) times daily.   FreeStyle Libre 3 Plus Sensor Misc Change sensor every 15 days. Apply to back of upper arm. DX: E11.65   furosemide  20 MG tablet Commonly known as: LASIX  TAKE ONE (1) TABLET BY MOUTH EVERY DAY   isosorbide  mononitrate 60 MG 24 hr tablet Commonly known as: IMDUR  Take 1 tablet (60 mg total) by mouth daily.   linagliptin  5 MG Tabs tablet Commonly known as: Tradjenta  Take 1 tablet (5 mg total) by mouth daily.   lisinopril  10 MG tablet Commonly known as: ZESTRIL  Take 1 tablet (10 mg total) by mouth daily.   loratadine  10 MG tablet Commonly known as: CLARITIN  Take 10 mg by mouth daily as needed for allergies.   metoprolol  tartrate 25 MG tablet Commonly known as: LOPRESSOR  Take 1 tablet (25 mg total) by mouth 2 (two) times daily.   MUSCLE CRAMP COMPLEX PO Take 1 tablet by mouth as needed (muscle spasms).   nitroGLYCERIN  0.4 MG SL tablet Commonly known as: NITROSTAT  DISSOLVE 1 TABLET UNDER TONGUE FOR CHESTPAIN.MAY REPEAT EVERY 5 MINUTES FOR 3 DOSES.IF NO RELIEF CALL 911 OR GO TO ER   NovoLOG   FlexPen 100 UNIT/ML FlexPen Generic drug: insulin  aspart Inject 0-6 Units into the skin 3 (three) times daily with meals. Inject per sliding scale 0-200 = 0 units, if less than 70 notify provider 201-250 = 2 units 251-300 = 3 units 301-350 = 4 units 351-400 = 5 units 401-450 = 6 units >450 = notify provider   potassium chloride  10 MEQ tablet Commonly known as: KLOR-CON  M Take 2 tablets (20 mEq total) by mouth 2 (two) times daily.   rOPINIRole  1 MG tablet Commonly known as: REQUIP  TAKE 1 TABLET BY MOUTH DAILY AS NEEDED What changed: reasons to take this   rosuvastatin  20 MG tablet Commonly known as: CRESTOR  TAKE ONE (1) TABLET BY MOUTH EVERY DAY What changed: how much to take   sertraline  50 MG tablet Commonly known as: ZOLOFT  Take 1.5 tablets (75 mg total) by mouth daily.   traZODone  50 MG tablet Commonly known as: DESYREL  Take 1 tablet (50 mg total) by mouth at bedtime as needed for sleep.   Tresiba  FlexTouch 200 UNIT/ML FlexTouch Pen Generic drug: insulin  degludec Inject 40 units subcutaneously daily.        Follow-up Information     [facility] Follow up.   Why: for home health nurse visits Contact information: [address] [place] [address] [phone]         Karen Karen HERO, FNP. Schedule an appointment as soon as possible for a visit in 1 week(s).   Specialty: Family Medicine Contact information: [address] [place] [address] [phone]                Allergies  Allergen Reactions   Iohexol  Anaphylaxis and Nausea Only    Pt had syncopal episode with nausea post IV CM late 1990's,  pt has had prednisone  prep with heart caths x 2 without problem  kdean 04/16/07, Onset Date: 04-15-1996   Codeine Nausea And Vomiting and Palpitations   Metformin  And Related Diarrhea   Ticlid [Ticlopidine Hcl]  Nausea And Vomiting   Jardiance  [Empagliflozin ] Other (See Comments)    Recurrent UTIs     Other  Procedures/Studies: Overnight EEG with video Result Date: 05/19/2024 Shelton Arlin KIDD, MD     05/20/2024  9:40 AM Patient Name: RYLIN SAEZ MRN: [id number] Epilepsy Attending: Arlin KIDD Shelton Referring Physician/Provider: Khaliqdina, Salman, MD  Duration: 05/19/2024 0339 to 05/20/2024 0930 Patient history: 73 y.o. female who was brought into [facility] ED by EMS for episodes of garbled speech, slurred words and facial twitching and facial droop. Have been occurring over the last year and becoming more frequent and now about 1 a week, had 3 episodes in 24 hours. EEG to evaluate for seizure Level of alertness: Awake, asleep AEDs during EEG study: None Technical aspects: This EEG study was done with scalp electrodes positioned according to the 10-20 International system of electrode placement. Electrical activity was reviewed with band pass filter of 1-70Hz , sensitivity of 7 uV/mm, display speed of 51mm/sec with a 60Hz  notched filter applied as appropriate. EEG data were recorded continuously and digitally stored.  Video monitoring was available and reviewed as appropriate. Description: The posterior dominant rhythm consists of 8 Hz activity of moderate voltage (25-35 uV) seen predominantly in posterior head regions, symmetric and reactive to eye opening and eye closing. Sleep was characterized by vertex waves, sleep spindles (12 to 14 Hz), maximal frontocentral region. Hyperventilation and photic stimulation were not performed.   Parts of study were difficult to interpret due to significant electrode artifact. EEG was not interpretable between 05/19/2024 1632 to 2223 IMPRESSION: This technically difficult study is within normal limits. No seizures or epileptiform discharges were seen throughout the recording. A normal interictal EEG does not exclude the diagnosis of epilepsy. Priyanka O Yadav   CT Head Wo Contrast Result Date: 05/17/2024 CLINICAL DATA:  Neuro deficit, acute, stroke suspected Seizure, HTN, slurred  speech, but when ems arrived no slurred speech but pt's bp was 202/97. EXAM: CT HEAD WITHOUT CONTRAST TECHNIQUE: Contiguous axial images were obtained from the base of the skull through the vertex without intravenous contrast. RADIATION DOSE REDUCTION: This exam was performed according to the departmental dose-optimization program which includes automated exposure control, adjustment of the mA and/or kV according to patient size and/or use of iterative reconstruction technique. COMPARISON:  MRI head 01/14/2024, CT head 01/13/2024 FINDINGS: Brain: Cerebral ventricle sizes are concordant with the degree of cerebral volume loss. Patchy and confluent areas of decreased attenuation are noted throughout the deep and periventricular white matter of the cerebral hemispheres bilaterally, compatible with chronic microvascular ischemic disease. No evidence of large-territorial acute infarction. No parenchymal hemorrhage. No mass lesion. No extra-axial collection. No mass effect or midline shift. No hydrocephalus. Basilar cisterns are patent. Vascular: No hyperdense vessel. Atherosclerotic calcifications are present within the cavernous internal carotid and vertebral arteries. Skull: No acute fracture or focal lesion. Sinuses/Orbits: Paranasal sinuses and mastoid air cells are clear. Bilateral lens replacement. Otherwise the orbits are unremarkable. Other: None. IMPRESSION: No acute intracranial abnormality. Electronically Signed   By: Morgane  Naveau M.D.   On: 05/17/2024 19:36   CARDIAC EVENT MONITOR Result Date: 04/29/2024 Normal sinus rhythm Two runs of NSVT with the longest lasting 9 beats No sustained arrhythmias     TODAY-DAY OF DISCHARGE:  Subjective:   Azalie Harbeck today has no headache,no chest abdominal pain,no new weakness tingling or numbness, feels much better wants to go home today.   Objective:   Blood pressure (!) 151/56, pulse 70, temperature 98.2 F (  36.8 C), temperature source Oral, resp. rate  14, height 5' 6 (1.676 m), weight 73 kg, SpO2 98%.  Intake/Output Summary (Last 24 hours) at 05/22/2024 0828 Last data filed at 05/22/2024 0800 Gross per 24 hour  Intake 460 ml  Output 575 ml  Net -115 ml   Filed Weights   05/17/24 1829  Weight: 73 kg    Exam: Awake Alert, Oriented *3, No new F.N deficits, Normal affect [place].AT,PERRAL Supple Neck,No JVD, No cervical lymphadenopathy appriciated.  Symmetrical Chest wall movement, Good air movement bilaterally, CTAB RRR,No Gallops,Rubs or new Murmurs, No Parasternal Heave +ve B.Sounds, Abd Soft, Non tender, No organomegaly appriciated, No rebound -guarding or rigidity. No Cyanosis, Clubbing or edema, No new Rash or bruise   PERTINENT RADIOLOGIC STUDIES: No results found.   PERTINENT LAB RESULTS: CBC: Recent Labs    05/20/24 0509  WBC 8.1  HGB 11.8*  HCT 35.4*  PLT 173   CMET CMP     Component Value Date/Time   NA 136 05/20/2024 0509   NA 138 05/06/2024 1447   K 3.6 05/20/2024 0509   CL 101 05/20/2024 0509   CO2 25 05/20/2024 0509   GLUCOSE 192 (H) 05/20/2024 0509   BUN 11 05/20/2024 0509   BUN 8 05/06/2024 1447   CREATININE 1.10 (H) 05/20/2024 0509   CREATININE 1.19 (H) 01/05/2014 1714   CALCIUM  9.1 05/20/2024 0509   PROT 6.5 05/18/2024 0425   PROT 6.3 02/28/2024 0924   ALBUMIN  3.0 (L) 05/18/2024 0425   ALBUMIN  3.9 02/28/2024 0924   AST 15 05/18/2024 0425   ALT 9 05/18/2024 0425   ALKPHOS 71 05/18/2024 0425   BILITOT 0.5 05/18/2024 0425   BILITOT 0.4 02/28/2024 0924   EGFR 59 (L) 05/06/2024 1447   GFRNONAA 52 (L) 05/20/2024 0509   GFRNONAA 63 02/05/2013 1002    GFR Estimated Creatinine Clearance: 45.2 mL/min (A) (by C-G formula based on SCr of 1.1 mg/dL (H)). No results for input(s): LIPASE, AMYLASE in the last 72 hours. No results for input(s): CKTOTAL, CKMB, CKMBINDEX, TROPONINI in the last 72 hours. Invalid input(s): POCBNP No results for input(s): DDIMER in the last 72 hours. No  results for input(s): HGBA1C in the last 72 hours. No results for input(s): CHOL, HDL, LDLCALC, TRIG, CHOLHDL, LDLDIRECT in the last 72 hours. No results for input(s): TSH, T4TOTAL, T3FREE, THYROIDAB in the last 72 hours.  Invalid input(s): FREET3 No results for input(s): VITAMINB12, FOLATE, FERRITIN, TIBC, IRON, RETICCTPCT in the last 72 hours. Coags: No results for input(s): INR in the last 72 hours.  Invalid input(s): PT Microbiology: No results found for this or any previous visit (from the past 240 hours).  FURTHER DISCHARGE INSTRUCTIONS:  Get Medicines reviewed and adjusted: Please take all your medications with you for your next visit with your Primary MD  Laboratory/radiological data: Please request your Primary MD to go over all hospital tests and procedure/radiological results at the follow up, please ask your Primary MD to get all Hospital records sent to his/her office.  In some cases, they will be blood work, cultures and biopsy results pending at the time of your discharge. Please request that your primary care M.D. goes through all the records of your hospital data and follows up on these results.  Also Note the following: If you experience worsening of your admission symptoms, develop shortness of breath, life threatening emergency, suicidal or homicidal thoughts you must seek medical attention immediately by calling 911 or calling your MD immediately  if symptoms less severe.  You must read complete instructions/literature along with all the possible adverse reactions/side effects for all the Medicines you take and that have been prescribed to you. Take any new Medicines after you have completely understood and accpet all the possible adverse reactions/side effects.   Do not drive when taking Pain medications or sleeping medications (Benzodaizepines)  Do not take more than prescribed Pain, Sleep and Anxiety Medications. It is not  advisable to combine anxiety,sleep and pain medications without talking with your primary care practitioner  Special Instructions: If you have smoked or chewed Tobacco  in the last 2 yrs please stop smoking, stop any regular Alcohol  and or any Recreational drug use.  Wear Seat belts while driving.  Please note: You were cared for by a hospitalist during your hospital stay. Once you are discharged, your primary care physician will handle any further medical issues. Please note that NO REFILLS for any discharge medications will be authorized once you are discharged, as it is imperative that you return to your primary care physician (or establish a relationship with a primary care physician if you do not have one) for your post hospital discharge needs so that they can reassess your need for medications and monitor your lab values.  Total Time spent coordinating discharge including counseling, education and face to face time equals greater than 30 minutes.  SignedBETHA Donalda Applebaum 05/22/2024 8:28 AM

## 2024-05-22 NOTE — Telephone Encounter (Signed)
 Copied from CRM #[id number]. Topic: Clinical - Home Health Verbal Orders >> May 22, 2024  3:15 PM DeAngela L wrote: Caller/Agency:Christy calling with [facility]   Callback Number: [phone] office number   Leopoldo has not been out to see the patient yet  And would like to ask if it would be ok if they can't see the patient for PT until Tuesday 05/26/24 ? since it's a holiday weekend and their office is closed for the holiday

## 2024-05-22 NOTE — Progress Notes (Signed)
 Neurology progress note  S: No spells captured. Patient removed her leads and they were not replaced.   O:  Vitals:   05/21/24 2026 05/22/24 0521  BP: (!) 140/54 95/81  Pulse: 67 66  Resp: 20 16  Temp: 98.8 F (37.1 C) 97.9 F (36.6 C)  SpO2: 96% 98%   General: Laying comfortably in bed; in no acute distress.  HENT: Normal oropharynx and mucosa. Normal external appearance of ears and nose.  Neck: Supple, no pain or tenderness  CV: No JVD. No peripheral edema.  Pulmonary: Symmetric Chest rise. Normal respiratory effort.  Abdomen: Soft to touch, non-tender.  Ext: No cyanosis, edema, or deformity  Skin: No rash. Normal palpation of skin.   Musculoskeletal: Normal digits and nails by inspection. No clubbing.    Neurologic Examination  Mental status/Cognition: Alert, oriented to self, place, month and year, good attention.  Speech/language: Fluent, comprehension intact, object naming intact, repetition intact.  Cranial nerves:   CN II Pupils equal and reactive to light, no VF deficits    CN III,IV,VI EOM intact, no gaze preference or deviation, no nystagmus    CN V normal sensation in V1, V2, and V3 segments bilaterally    CN VII no asymmetry, no nasolabial fold flattening    CN VIII normal hearing to speech    CN IX & X normal palatal elevation, no uvular deviation    CN XI 5/5 head turn and 5/5 shoulder shrug bilaterally    CN XII midline tongue protrusion     Motor:  Muscle bulk: normal, tone normal Mvmt Root Nerve  Muscle Right Left Comments  SA C5/6 Ax Deltoid 4+ 4+    EF C5/6 Mc Biceps 4+ 4+    EE C6/7/8 Rad Triceps 4+ 4+    WF C6/7 Med FCR        WE C7/8 PIN ECU        F Ab C8/T1 U ADM/FDI 4+ 4+    HF L1/2/3 Fem Illopsoas 4+ 4+    KE L2/3/4 Fem Quad        DF L4/5 D Peron Tib Ant 4+ 4+    PF S1/2 Tibial Grc/Sol 4+ 4+      Reflexes:   Right Left Comments  Pectoralis         Biceps (C5/6) 2+ 2+    Brachioradialis (C5/6) 2+ 2+     Triceps (C6/7) 2+ 2+      Patellar (L3/4) 3 3     Achilles (S1) 2 2     Hoffman + +     Plantar withdraws withdraws    Jaw jerk       Sensation:  Light touch Intact throughout   Pin prick     Temperature     Vibration    Proprioception      Coordination/Complex Motor:  - Finger to Nose intact BL - Heel to shin intact BL - Rapid alternating movement are normal - Gait: deferred.   CT Head without contrast(Personally reviewed): CTH was negative for a large hypodensity concerning for a large territory infarct or hyperdensity concerning for an ICH   Prior MRI Brain from April 2025(Personally reviewed): 1. No evidence of an acute intracranial abnormality. 2. Mild-to-moderate chronic small vessel ischemic changes within the cerebral white matter and pons, similar to the prior brain MRI of 11/28/2023. 3. Mild-to-moderate generalized cerebral atrophy. 4. Mild right maxillary sinus disease, as described.  [name] is a 73 y.o. female with hx  of CAD, HTN, HLD, DM2, PVD, GERD, who was brought into [facility] ED by EMS for episodes of garbled speech, slurred words and facial twitching and facial droop. Have been occurring over the last year and becoming more frequent and now about 1 a week, had 3 episodes in 24 hours.   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 Perhaps triggered by hyperglycemia but she was not significantly hyperglycemic when she had the event back in April of 2025.  No spells captured on LTM.  - OK to not rehook LTM EEG - Start keppra  500mg  bid to see if this reduces spell frequency - Ambulatory referral to neurology at discharge  - Neurology will s/o but please re-engage if additional neurologic concerns arise.  Elida Ross, MD [facility] [phone]  If 7pm- 7am, please page neurology on call as listed in AMION.

## 2024-05-26 ENCOUNTER — Telehealth: Payer: Self-pay | Admitting: *Deleted

## 2024-05-26 ENCOUNTER — Telehealth: Payer: Self-pay | Admitting: Family Medicine

## 2024-05-26 DIAGNOSIS — E441 Mild protein-calorie malnutrition: Secondary | ICD-10-CM | POA: Diagnosis not present

## 2024-05-26 DIAGNOSIS — G43909 Migraine, unspecified, not intractable, without status migrainosus: Secondary | ICD-10-CM | POA: Diagnosis not present

## 2024-05-26 DIAGNOSIS — E1159 Type 2 diabetes mellitus with other circulatory complications: Secondary | ICD-10-CM | POA: Diagnosis not present

## 2024-05-26 DIAGNOSIS — Z794 Long term (current) use of insulin: Secondary | ICD-10-CM | POA: Diagnosis not present

## 2024-05-26 DIAGNOSIS — E1151 Type 2 diabetes mellitus with diabetic peripheral angiopathy without gangrene: Secondary | ICD-10-CM | POA: Diagnosis not present

## 2024-05-26 DIAGNOSIS — D649 Anemia, unspecified: Secondary | ICD-10-CM | POA: Diagnosis not present

## 2024-05-26 DIAGNOSIS — E1165 Type 2 diabetes mellitus with hyperglycemia: Secondary | ICD-10-CM | POA: Diagnosis not present

## 2024-05-26 DIAGNOSIS — I25110 Atherosclerotic heart disease of native coronary artery with unstable angina pectoris: Secondary | ICD-10-CM | POA: Diagnosis not present

## 2024-05-26 DIAGNOSIS — I152 Hypertension secondary to endocrine disorders: Secondary | ICD-10-CM | POA: Diagnosis not present

## 2024-05-26 DIAGNOSIS — F339 Major depressive disorder, recurrent, unspecified: Secondary | ICD-10-CM | POA: Diagnosis not present

## 2024-05-26 NOTE — Telephone Encounter (Unsigned)
 Copied from CRM [id number]. Topic: Clinical - Home Health Verbal Orders >> May 26, 2024  2:11 PM Willma SAUNDERS wrote: Caller/Agency: [place], [facility] Callback Number: [phone] Service Requested: Physical Therapy Frequency: 2 times a week for 8 weeks. Any new concerns about the patient? No

## 2024-05-26 NOTE — Transitions of Care (Post Inpatient/ED Visit) (Signed)
 05/26/2024  Name: [name] MRN: [id number] DOB: Oct 01, 1950  Today's TOC FU Call Status: Today's TOC FU Call Status:: Successful TOC FU Call Completed TOC FU Call Complete Date: 05/26/24 Patient's Name and Date of Birth confirmed.  Transition Care Management Follow-up Telephone Call Date of Discharge: 05/22/24 Discharge Facility: [facility]) Primary Inpatient Discharge Diagnosis:: Seizures How have you been since you were released from the hospital?: Better (eating, drinking well, has diarrhea at times, pt states this is chronic) Any questions or concerns?: No  Items Reviewed: Did you receive and understand the discharge instructions provided?: Yes Any new allergies since your discharge?: No Dietary orders reviewed?: Yes Type of Diet Ordered:: heart healthy,  low sodium Do you have support at home?: Yes People in Home [RPT]: alone Name of Support/Comfort Primary Source: pt states grandchildren can assist if needed  Medications Reviewed Today: Medications Reviewed Today     Reviewed by Aura Mliss LABOR, RN (Registered Nurse) on 05/26/24 at 1455  Med List Status: <None>   Medication Order Taking? Sig Documenting Provider Last Dose Status Informant  acetaminophen  (TYLENOL ) 500 MG tablet [id number] Yes Take 1,000 mg by mouth every 6 (six) hours as needed for mild pain (pain score 1-3) or headache. [provider]  Active Self, Pharmacy Records  aspirin  81 MG chewable tablet [id number] Yes Chew 1 tablet (81 mg total) by mouth daily. Davia Nydia POUR, MD  Active Self, Pharmacy Records  Blood Glucose Monitoring Suppl DEVI [id number] Yes 1 each by Does not apply route in the morning, at noon, and at bedtime. May substitute to any manufacturer covered by patient's insurance. [name], FNP  Active Self, Pharmacy Records  clopidogrel  (PLAVIX ) 75 MG tablet [id number] Yes Take 1 tablet (75 mg total) by mouth daily with breakfast. Pietro Redell RAMAN, MD  Active Self, Pharmacy  Records  Continuous Glucose Sensor (FREESTYLE LIBRE 3 PLUS SENSOR) [place] [id number] Yes Change sensor every 15 days. Apply to back of upper arm. DX: E11.65 [name], FNP  Active Self, Pharmacy Records  cyanocobalamin  (VITAMIN B12) injection 1,000 mcg [id number]   [name], FNP  Active   cyanocobalamin  (VITAMIN B12) injection 1,000 mcg [id number]   [name], FNP  Active   famotidine  (PEPCID ) 20 MG tablet [id number] Yes Take 1 tablet (20 mg total) by mouth 2 (two) times daily. [name], FNP  Active Self, Pharmacy Records  furosemide  (LASIX ) 20 MG tablet [id number] Yes TAKE ONE (1) TABLET BY MOUTH EVERY DAY [name], FNP  Active Self, Pharmacy Records  Homeopathic Products (MUSCLE CRAMP COMPLEX PO) [id number] Yes Take 1 tablet by mouth as needed (muscle spasms). [provider]  Active Self, Pharmacy Records  insulin  aspart (NOVOLOG  FLEXPEN) 100 UNIT/ML FlexPen [id number] Yes Inject 0-6 Units into the skin 3 (three) times daily with meals. Inject per sliding scale 0-200 = 0 units, if less than 70 notify provider 201-250 = 2 units 251-300 = 3 units 301-350 = 4 units 351-400 = 5 units 401-450 = 6 units >450 = notify provider Ricky Fines, MD  Active Self, Pharmacy Records  insulin  degludec (TRESIBA  FLEXTOUCH) 200 UNIT/ML FlexTouch Pen [id number] Yes Inject 40 units subcutaneously daily. [name], FNP  Active Self, Pharmacy Records  isosorbide  mononitrate (IMDUR ) 60 MG 24 hr tablet [id number] Yes Take 1 tablet (60 mg total) by mouth daily. Lavona Agent, MD  Active Self, Pharmacy Records  linagliptin  (TRADJENTA ) 5 MG TABS tablet [id number] Yes Take 1 tablet (5  mg total) by mouth daily. Raenelle Donalda HERO, MD  Active   lisinopril  (ZESTRIL ) 10 MG tablet [id number] Yes Take 1 tablet (10 mg total) by mouth daily. Lavona Agent, MD  Active Self, Pharmacy Records  loratadine  (CLARITIN ) 10 MG tablet [id number] Yes Take 10 mg by mouth daily as needed for  allergies. [provider]  Active Self, Pharmacy Records  metoprolol  tartrate (LOPRESSOR ) 25 MG tablet [id number] Yes Take 1 tablet (25 mg total) by mouth 2 (two) times daily. [name], FNP  Active Self, Pharmacy Records  nitroGLYCERIN  (NITROSTAT ) 0.4 MG SL tablet [id number] Yes DISSOLVE 1 TABLET UNDER TONGUE FOR CHESTPAIN.MAY REPEAT EVERY 5 MINUTES FOR 3 DOSES.IF NO RELIEF CALL 911 OR GO TO ER [name], FNP  Active Self, Pharmacy Records  potassium chloride  (KLOR-CON  M) 10 MEQ tablet [id number] Yes Take 2 tablets (20 mEq total) by mouth 2 (two) times daily. [name], FNP  Active Self, Pharmacy Records  rOPINIRole  (REQUIP ) 1 MG tablet [id number] Yes TAKE 1 TABLET BY MOUTH DAILY AS NEEDED [name], FNP  Active Self, Pharmacy Records  rosuvastatin  (CRESTOR ) 20 MG tablet [id number] Yes TAKE ONE (1) TABLET BY MOUTH EVERY DAY [name], FNP  Active Self, Pharmacy Records  sertraline  (ZOLOFT ) 50 MG tablet [id number] Yes Take 1.5 tablets (75 mg total) by mouth daily. [name], FNP  Active Self, Pharmacy Records  traZODone  (DESYREL ) 50 MG tablet [id number] Yes Take 1 tablet (50 mg total) by mouth at bedtime as needed for sleep. [name], FNP  Active Self, Pharmacy Records            Home Care and Equipment/Supplies: Were Home Health Services Ordered?: Yes Name of Home Health Agency:: [phone] Has Agency set up a time to come to your home?: Yes First Home Health Visit Date: 05/26/24 Any new equipment or medical supplies ordered?: No  Functional Questionnaire: Do you need assistance with bathing/showering or dressing?: No Do you need assistance with meal preparation?: No Do you need assistance with eating?: No Do you have difficulty maintaining continence: No Do you need assistance with getting out of bed/getting out of a chair/moving?: Yes (walker) Do you have difficulty managing or taking your medications?: No  Follow up  appointments reviewed: PCP Follow-up appointment confirmed?: Yes Date of PCP follow-up appointment?: 06/04/24 Follow-up Provider: Annabella Joesph FNP @ 830 am Specialist Hospital Follow-up appointment confirmed?: Yes Date of Specialist follow-up appointment?: 06/03/24 Follow-Up Specialty Provider:: Dr. Lavona cardiology Do you need transportation to your follow-up appointment?: No Do you understand care options if your condition(s) worsen?: Yes-patient verbalized understanding  SDOH Interventions Today    Flowsheet Row Most Recent Value  SDOH Interventions   Food Insecurity Interventions Intervention Not Indicated  Housing Interventions Intervention Not Indicated  Transportation Interventions Intervention Not Indicated  Utilities Interventions Intervention Not Indicated    Goals Addressed             This Visit's Progress    VBCI Transitions of Care (TOC) Care Plan       Problems:  Recent Hospitalization for treatment of Seizures Referral has been placed from hospital to [facility]  Goal:  Over the next 30 days, the patient will not experience hospital readmission  Interventions:  Falls Interventions: Reviewed medications and discussed potential side effects of medications such as dizziness and frequent urination Advised patient of importance of notifying provider of falls Screening for signs and symptoms of depression related to chronic disease  state  Assessed social determinant of health barriers  Seizures Interventions: Evaluation of current treatment plan related to seizures, self-management and patient's adherence to plan as established by provider. Discussed plans with patient for ongoing care management follow up and provided patient with direct contact information for care management team Provided education to patient re: signs/ symptoms seizure activity, importance of reporting to provider Reviewed medications with patient and  discussed importance of taking as prescribed Reviewed scheduled/upcoming provider appointments including 06/04/24 primary care provider,  06/03/24 cardiologist Advised patient, providing education and rationale, to check cbg as prescribed/ per CGM and record, calling provider for findings outside established parameters  Discussed plans with patient for ongoing care management follow up and provided patient with direct contact information for care management team Reviewed safety precautions  Patient Self Care Activities:  Attend all scheduled provider appointments Attend church or other social activities Call pharmacy for medication refills 3-7 days in advance of running out of medications Call provider office for new concerns or questions  Notify RN Care Manager of TOC call rescheduling needs Participate in Transition of Care Program/Attend TOC scheduled calls Perform all self care activities independently  Take medications as prescribed   Report any seizure activity to your provider Fall prevention strategies: change positions slowly, use a walker or cane (per provider recommendations) when walking, keep walkways clear, have good lighting in room. It is important to contact your provider if you have any falls. Maintain muscle strength/tone by exercise per provider recommendations.   Plan:  Telephone follow up appointment with care management team member scheduled for:  06/02/24 @ 215 pm The patient has been provided with contact information for the care management team and has been advised to call with any health related questions or concerns.         Mliss Creed [facility], BSN RN Care Manager/ Transition of Care [place]/ [facility] [phone]

## 2024-05-26 NOTE — Telephone Encounter (Signed)
 I called and spoke with [place] and gave him verbal okay for patient to have PT, 2x a week for 8 weeks.

## 2024-05-26 NOTE — Transitions of Care (Post Inpatient/ED Visit) (Signed)
   05/26/2024  Name: [name] MRN: [id number] DOB: 22-Feb-1951  Today's TOC FU Call Status: Today's TOC FU Call Status:: Successful TOC FU Call Completed TOC FU Call Complete Date: 05/26/24 Patient's Name and Date of Birth confirmed.  Transition Care Management Follow-up Telephone Call Date of Discharge: 06/22/24 Discharge Facility: [facility]) Type of Discharge: Inpatient Admission Primary Inpatient Discharge Diagnosis:: Seizures How have you been since you were released from the hospital?:  (pt states  feeling okay) Any questions or concerns?: No  Items Reviewed: Did you receive and understand the discharge instructions provided?: Yes Medications obtained,verified, and reconciled?: No Medications Not Reviewed Reasons:: Other: (unable to review, pt had to hang up, PT at her home) Any new allergies since your discharge?: No Dietary orders reviewed?: Yes Type of Diet Ordered:: heart healthy    low sodium Do you have support at home?: Yes People in Home [RPT]:  (unable to complete) RN CM attempted to reach pt later with no answer to telephone, unable to complete remainder of TOC and assessments  Medications Reviewed Today:  Unable to review medications with pt due to PT at patient's home and pt could not finish conversation Medications Reviewed Today   Medications were not reviewed in this encounter     Home Care and Equipment/Supplies: Were Home Health Services Ordered?: Yes Name of Home Health Agency:: [facility] Has Agency set up a time to come to your home?: Yes First Home Health Visit Date: 05/26/24 (spoke with home health physical therapist who is at patient's home now) Any new equipment or medical supplies ordered?: No  Functional Questionnaire: Do you need assistance with bathing/showering or dressing?:  (unable to complete majority of functional assessment) Do you need assistance with eating?: No Do you have difficulty maintaining continence: No  Follow up  appointments reviewed: PCP Follow-up appointment confirmed?: Yes Date of PCP follow-up appointment?: 06/04/24 Follow-up Provider: Annabella Search FNP  @ 830 am Specialist Hospital Follow-up appointment confirmed?: Yes Date of Specialist follow-up appointment?: 06/03/24 Follow-Up Specialty Provider:: Dr. Lavona  cardiology Do you need transportation to your follow-up appointment?: No Do you understand care options if your condition(s) worsen?: Yes-patient verbalized understanding  Will attempt to outreach pt again this week  Mliss Creed [facility], BSN RN Care Manager/ Transition of Care [place]/ [facility] [phone]

## 2024-05-27 ENCOUNTER — Other Ambulatory Visit (HOSPITAL_COMMUNITY): Payer: Self-pay

## 2024-05-28 ENCOUNTER — Ambulatory Visit: Payer: Self-pay

## 2024-05-28 NOTE — Telephone Encounter (Signed)
 Pt PT also asking if pt needs [facility] nursing for diabetes control added to to call phone number [phone], [place], PT.   FYI Only or Action Required?: Action required by provider: update on patient condition.  Patient was last seen in primary care on 05/06/2024 by [name], FNP.  Called Nurse Triage reporting Hyperglycemia.  Symptoms began several weeks ago.  Interventions attempted: Rest, hydration, or home remedies.  Symptoms are: unchanged.  Triage Disposition: Home Care  Patient/caregiver understands and will follow disposition?: yes  Copied from CRM #[id number]. Topic: Clinical - Red Word Triage >> May 28, 2024  4:24 PM Ivette P wrote: Red Word that prompted transfer to Nurse Triage: blood Sugar - reading 385 digital Libra is high and has pt drinking water   Leah - Pt [facility] Reason for Disposition  [1] Blood glucose > 300 mg/dL (83.2 mmol/L) AND [7] uses insulin  (e.g., insulin -dependent, all people with type 1 diabetes)  Answer Assessment - Initial Assessment Questions 1. BLOOD GLUCOSE: What is your blood glucose level?      387 2. ONSET: When did you check the blood glucose?     current 3. USUAL RANGE: What is your glucose level usually? (e.g., usual fasting morning value, usual evening value)     This time of day normally is 200-400, at noc she is in the 60s 4. KETONES: Do you check for ketones (urine or blood test strips)? If Yes, ask: What does the test show now?      Does not check for ketones in urine 6. INSULIN : Do you take insulin ? What type of insulin (s) do you use? What is the mode of delivery? (syringe, pen; injection or pump)?      40 u at 1130/12 long acting, 12 u of short acting at 2/3pm 7. DIABETES PILLS: Do you take any pills for your diabetes? If Yes, ask: Have you missed taking any pills recently?     Denies pills 8. OTHER SYMPTOMS: Do you have any symptoms? (e.g., fever, frequent urination, difficulty breathing,  dizziness, weakness, vomiting)  Protocols used: Diabetes - High Blood Sugar-A-AH

## 2024-05-29 NOTE — Telephone Encounter (Signed)
 Per pt 296 this am. States that this is nothing new and that she has has blood sugars this elevated for 8-9y. Pt denies symptoms. Pt has appt 9/11 with Tiffany for HFU and B12 injection 9/8

## 2024-06-01 ENCOUNTER — Ambulatory Visit (INDEPENDENT_AMBULATORY_CARE_PROVIDER_SITE_OTHER): Admitting: *Deleted

## 2024-06-01 DIAGNOSIS — Z23 Encounter for immunization: Secondary | ICD-10-CM | POA: Diagnosis not present

## 2024-06-01 DIAGNOSIS — E538 Deficiency of other specified B group vitamins: Secondary | ICD-10-CM

## 2024-06-01 NOTE — Progress Notes (Signed)
 Patient is in office today for a nurse visit for B12 Injection. Patient Injection was given in the  Left deltoid. Patient tolerated injection well.  Flu vaccine given in right deltoid. Patient tolerated well.

## 2024-06-02 ENCOUNTER — Other Ambulatory Visit: Payer: Self-pay | Admitting: *Deleted

## 2024-06-02 ENCOUNTER — Ambulatory Visit: Payer: Self-pay

## 2024-06-02 NOTE — Telephone Encounter (Signed)
 FYI Only or Action Required?: FYI  Patient was last seen in primary care on 05/06/2024 by [name], FNP.  Called Nurse Triage reporting Fall.  Symptoms began several days ago.  Interventions attempted: Nothing.  Symptoms are: stable.  Triage Disposition: Home Care  Patient/caregiver understands and will follow disposition?: YesCopied from CRM #[id number]. Topic: Clinical - Red Word Triage >> Jun 02, 2024  9:49 AM Myrick T wrote: [facility] that prompted transfer to Nurse Triage: Oneil from [facility] called stated patient fell backwards but no pain. Reason for Disposition  [1] Recent fall AND [2] no injury  Answer Assessment - Initial Assessment Questions Mark,PTA,  from [facility] called to report a fall from [place]. Pt stated she was cooking breakfast and got dizzy and fell backwards. Pt denied any injuries, headache, blurred vision.    1. MECHANISM: How did the fall happen?     Fell back- got dizzy  2. DOMESTIC VIOLENCE AND ELDER ABUSE SCREENING: Did you fall because someone pushed you or tried to hurt you? If Yes, ask: Are you safe now?     Na  3. ONSET: When did the fall happen? (e.g., minutes, hours, or days ago)     Saturday  4. LOCATION: What part of the body hit the ground? (e.g., back, buttocks, head, hips, knees, hands, head, stomach)     Not sure 5. INJURY: Did you hurt (injure) yourself when you fell? If Yes, ask: What did you injure? Tell me more about this? (e.g., body area; type of injury; pain severity)     denies 6. PAIN: Is there any pain? If Yes, ask: How bad is the pain? (e.g., Scale 0-10; or none, mild,      denies 7. SIZE: For cuts, bruises, or swelling, ask: How large is it? (e.g., inches or centimeters)      denies  9. OTHER SYMPTOMS: Do you have any other symptoms? (e.g., dizziness, fever, weakness; new-onset or worsening).      dizzy 10. CAUSE: What do you think caused the fall (or falling)? (e.g., dizzy spell,  tripped)       Dizzy  Protocols used: Falls and [facility]

## 2024-06-02 NOTE — Patient Outreach (Signed)
 Transition of Care week 2  Visit Note  06/02/2024  Name: [name] MRN: [id number]          DOB: Jan 12, 1951  Situation: Patient enrolled in [facility] 30-day program. Visit completed with patient by telephone.   Background:    Past Medical History:  Diagnosis Date   Anxiety    CAD (coronary artery disease)    DES to circumflex 02/2007, BMS to LAD and PTCA diagonal 03/2007   Carotid artery plaque    Mild   Cataract    Depression    Diverticulitis, colon    Elevated d-dimer 01/08/2014   Essential hypertension, benign    GERD (gastroesophageal reflux disease)    H/O hiatal hernia    HLD (hyperlipidemia)    IDDM (insulin  dependent diabetes mellitus)    Migraine    used to have them really bad; don't have them anymore (01/07/2014)   MS (multiple sclerosis) (HCC)    Not confirmed   PAT (paroxysmal atrial tachycardia) (HCC)    Prolapse of uterus    PVD (peripheral vascular disease) (HCC)    TIA (transient ischemic attack) 1980's    Assessment: Patient Reported Symptoms: Cognitive Cognitive Status: No symptoms reported, Alert and oriented to person, place, and time, Able to follow simple commands, Incoherent or nonsensical speech      Neurological Neurological Review of Symptoms: No symptoms reported (pt has appointment with [facility] on 06/30/24) [place] Neurological Symptoms/Conditions [RPT]: pt hospitalized with seizures, pt states no seizure activity since hospital discharge, states no dizziness Neurological Management Strategies: Adequate rest, Routine screening Neurological Self-Management Outcome: 3 (uncertain) Neurological Comment: pt had a fall this past Saturday 05/30/24 due to dropping egg on floor, bent over to pickup and became dizzy, home health reported to primary care provider, pt denies injury, reviewed importance of using walker, asking for assistance as needed, rise slowly and no sudden moves, do not amublate if dizzy,  reinforced safety precautions  HEENT  HEENT Symptoms Reported: No symptoms reported      Cardiovascular Cardiovascular Symptoms Reported: No symptoms reported Does patient have uncontrolled Hypertension?: No    Respiratory Respiratory Symptoms Reported: No symptoms reported    Endocrine Endocrine Symptoms Reported: No symptoms reported Is patient diabetic?: Yes Is patient checking blood sugars at home?: Yes List most recent blood sugar readings, include date and time of day: Freestyle Libre- reports FBS today 195   AIC 8.0   02/28/24 Endocrine Self-Management Outcome: 4 (good) Endocrine Comment: Reinforced carbohdyrate modified diet, importance of good blood sugar control  Gastrointestinal Gastrointestinal Symptoms Reported: No symptoms reported      Genitourinary Genitourinary Symptoms Reported: No symptoms reported    Integumentary Integumentary Symptoms Reported: No symptoms reported    Musculoskeletal Musculoskelatal Symptoms Reviewed: Difficulty walking Additional Musculoskeletal Details: walker Musculoskeletal Management Strategies: Medical device, Routine screening Musculoskeletal Self-Management Outcome: 3 (uncertain)      Psychosocial Psychosocial Symptoms Reported: No symptoms reported         There were no vitals filed for this visit.  Medications Reviewed Today     Reviewed by Aura Mliss LABOR, RN (Registered Nurse) on 06/02/24 at 1439  Med List Status: <None>   Medication Order Taking? Sig Documenting Provider Last Dose Status Informant  acetaminophen  (TYLENOL ) 500 MG tablet [id number]  Take 1,000 mg by mouth every 6 (six) hours as needed for mild pain (pain score 1-3) or headache. [provider]  Active Self, Pharmacy Records  aspirin  81 MG chewable tablet [id number]  Chew  1 tablet (81 mg total) by mouth daily. Davia Nydia POUR, MD  Active Self, Pharmacy Records  Blood Glucose Monitoring Suppl DEVI [id number]  1 each by Does not apply route in the morning, at noon, and at bedtime. May substitute  to any manufacturer covered by patient's insurance. [name], FNP  Active Self, Pharmacy Records  clopidogrel  (PLAVIX ) 75 MG tablet [id number]  Take 1 tablet (75 mg total) by mouth daily with breakfast. Pietro Redell RAMAN, MD  Active Self, Pharmacy Records  Continuous Glucose Sensor (FREESTYLE LIBRE 3 PLUS SENSOR) [place] [id number]  Change sensor every 15 days. Apply to back of upper arm. DX: E11.65 [name], FNP  Active Self, Pharmacy Records  cyanocobalamin  (VITAMIN B12) injection 1,000 mcg [id number]   [name], FNP  Active   cyanocobalamin  (VITAMIN B12) injection 1,000 mcg [id number]   [name], FNP  Active   famotidine  (PEPCID ) 20 MG tablet [id number]  Take 1 tablet (20 mg total) by mouth 2 (two) times daily. [name], FNP  Active Self, Pharmacy Records  furosemide  (LASIX ) 20 MG tablet [id number]  TAKE ONE (1) TABLET BY MOUTH EVERY DAY [name], FNP  Active Self, Pharmacy Records  Homeopathic Products (MUSCLE CRAMP COMPLEX PO) [id number]  Take 1 tablet by mouth as needed (muscle spasms). [provider]  Active Self, Pharmacy Records  insulin  aspart (NOVOLOG  FLEXPEN) 100 UNIT/ML FlexPen [id number]  Inject 0-6 Units into the skin 3 (three) times daily with meals. Inject per sliding scale 0-200 = 0 units, if less than 70 notify provider 201-250 = 2 units 251-300 = 3 units 301-350 = 4 units 351-400 = 5 units 401-450 = 6 units >450 = notify provider Ricky Fines, MD  Active Self, Pharmacy Records  insulin  degludec (TRESIBA  FLEXTOUCH) 200 UNIT/ML FlexTouch Pen [id number]  Inject 40 units subcutaneously daily. [name], FNP  Active Self, Pharmacy Records  isosorbide  mononitrate (IMDUR ) 60 MG 24 hr tablet [id number]  Take 1 tablet (60 mg total) by mouth daily. Lavona Agent, MD  Active Self, Pharmacy Records  linagliptin  (TRADJENTA ) 5 MG TABS tablet [id number]  Take 1 tablet (5 mg total) by mouth daily. Raenelle Donalda HERO, MD  Active    lisinopril  (ZESTRIL ) 10 MG tablet [id number]  Take 1 tablet (10 mg total) by mouth daily. Lavona Agent, MD  Active Self, Pharmacy Records  loratadine  (CLARITIN ) 10 MG tablet [id number]  Take 10 mg by mouth daily as needed for allergies. [provider]  Active Self, Pharmacy Records  metoprolol  tartrate (LOPRESSOR ) 25 MG tablet [id number]  Take 1 tablet (25 mg total) by mouth 2 (two) times daily. [name], FNP  Active Self, Pharmacy Records  nitroGLYCERIN  (NITROSTAT ) 0.4 MG SL tablet [id number]  DISSOLVE 1 TABLET UNDER TONGUE FOR CHESTPAIN.MAY REPEAT EVERY 5 MINUTES FOR 3 DOSES.IF NO RELIEF CALL 911 OR GO TO ER [name], FNP  Active Self, Pharmacy Records  potassium chloride  (KLOR-CON  M) 10 MEQ tablet [id number]  Take 2 tablets (20 mEq total) by mouth 2 (two) times daily. [name], FNP  Active Self, Pharmacy Records  rOPINIRole  (REQUIP ) 1 MG tablet [id number]  TAKE 1 TABLET BY MOUTH DAILY AS NEEDED [name], FNP  Active Self, Pharmacy Records  rosuvastatin  (CRESTOR ) 20 MG tablet [id number]  TAKE ONE (1) TABLET BY MOUTH EVERY DAY [name], FNP  Active Self, Pharmacy Records  sertraline  (ZOLOFT ) 50 MG tablet [id number]  Take 1.5 tablets (75 mg total) by mouth  daily. [name], FNP  Active Self, Pharmacy Records  traZODone  (DESYREL ) 50 MG tablet [id number]  Take 1 tablet (50 mg total) by mouth at bedtime as needed for sleep. [name], FNP  Active Self, Pharmacy Records            Goals Addressed             This Visit's Progress    VBCI Transitions of Care (TOC) Care Plan       Problems:  Recent Hospitalization for treatment of Seizures 06/02/24 pt reports she has appointment with [facility] on 06/30/24, pt fell this past Saturday, this fall was reported to primary care provider by home health, pt states she was not injured, pt has walker, pt denies any seizure activity  Goal:  Over the next 30  days, the patient will not experience hospital readmission  Interventions:  Falls Interventions: Reviewed medications and discussed potential side effects of medications such as dizziness and frequent urination Advised patient of importance of notifying provider of falls Assessed for signs and symptoms of orthostatic hypotension Assessed for falls since last encounter  Seizures Interventions: Evaluation of current treatment plan related to seizures, self-management and patient's adherence to plan as established by provider. Discussed plans with patient for ongoing care management follow up and provided patient with direct contact information for care management team Provided education to patient re: signs/ symptoms seizure activity, importance of reporting to provider Reviewed medications with patient and discussed importance of taking as prescribed Reviewed scheduled/upcoming provider appointments including 06/04/24 primary care provider,  06/03/24 cardiologist, 9/25 Dr. Lenis endocrinologist, 10/7 neurologist  Advised patient, providing education and rationale, to check cbg as prescribed/ per CGM and record, calling provider for findings outside established parameters  Discussed plans with patient for ongoing care management follow up and provided patient with direct contact information for care management team Reinforced safety precautions, importance of using walker  Patient Self Care Activities:  Attend all scheduled provider appointments Attend church or other social activities Call pharmacy for medication refills 3-7 days in advance of running out of medications Call provider office for new concerns or questions  Notify RN Care Manager of TOC call rescheduling needs Participate in Transition of Care Program/Attend TOC scheduled calls Perform all self care activities independently  Take medications as prescribed   Report any seizure activity or falls to your provider Fall prevention  strategies: change positions slowly, use a walker or cane (per provider recommendations) when walking, keep walkways clear, have good lighting in room. It is important to contact your provider if you have any falls. Maintain muscle strength/tone by exercise per provider recommendations.   Plan:  Telephone follow up appointment with care management team member scheduled for:  06/09/24 @ 3 pm The patient has been provided with contact information for the care management team and has been advised to call with any health related questions or concerns.         Recommendation:   PCP Follow-up Specialty provider follow-up neurologist, endocrinologist, cardiologist  Follow Up Plan:   Telephone follow-up 06/09/24 @ 315 pm  Mliss Creed [facility], BSN RN Care Manager/ Transition of Care [place]/ [facility] [phone]

## 2024-06-02 NOTE — Patient Instructions (Signed)
 Visit Information  Thank you for taking time to visit with me today. Please don't hesitate to contact me if I can be of assistance to you before our next scheduled telephone appointment.  Our next appointment is by telephone on 06/09/24 @ 315 pm  Following is a copy of your care plan:   Goals Addressed             This Visit's Progress    VBCI Transitions of Care (TOC) Care Plan       Problems:  Recent Hospitalization for treatment of Seizures 06/02/24 pt reports she has appointment with [facility] on 06/30/24, pt fell this past Saturday, this fall was reported to primary care provider by home health, pt states she was not injured, pt has walker, pt denies any seizure activity  Goal:  Over the next 30 days, the patient will not experience hospital readmission  Interventions:  Falls Interventions: Reviewed medications and discussed potential side effects of medications such as dizziness and frequent urination Advised patient of importance of notifying provider of falls Assessed for signs and symptoms of orthostatic hypotension Assessed for falls since last encounter  Seizures Interventions: Evaluation of current treatment plan related to seizures, self-management and patient's adherence to plan as established by provider. Discussed plans with patient for ongoing care management follow up and provided patient with direct contact information for care management team Provided education to patient re: signs/ symptoms seizure activity, importance of reporting to provider Reviewed medications with patient and discussed importance of taking as prescribed Reviewed scheduled/upcoming provider appointments including 06/04/24 primary care provider,  06/03/24 cardiologist, 9/25 Dr. Lenis endocrinologist, 10/7 neurologist  Advised patient, providing education and rationale, to check cbg as prescribed/ per CGM and record, calling provider for findings outside established  parameters  Discussed plans with patient for ongoing care management follow up and provided patient with direct contact information for care management team Reinforced safety precautions, importance of using walker  Patient Self Care Activities:  Attend all scheduled provider appointments Attend church or other social activities Call pharmacy for medication refills 3-7 days in advance of running out of medications Call provider office for new concerns or questions  Notify RN Care Manager of TOC call rescheduling needs Participate in Transition of Care Program/Attend TOC scheduled calls Perform all self care activities independently  Take medications as prescribed   Report any seizure activity or falls to your provider Fall prevention strategies: change positions slowly, use a walker or cane (per provider recommendations) when walking, keep walkways clear, have good lighting in room. It is important to contact your provider if you have any falls. Maintain muscle strength/tone by exercise per provider recommendations.   Plan:  Telephone follow up appointment with care management team member scheduled for:  06/09/24 @ 3 pm The patient has been provided with contact information for the care management team and has been advised to call with any health related questions or concerns.         Patient verbalizes understanding of instructions and care plan provided today and agrees to view in MyChart. Active MyChart status and patient understanding of how to access instructions and care plan via MyChart confirmed with patient.     Telephone follow up appointment with care management team member scheduled for: 06/09/24 @ 315 pm  Please call the care guide team at [phone] if you need to cancel or reschedule your appointment.   Please call the Suicide and Crisis Lifeline: 988 call the [place]   National Suicide Prevention Lifeline: 650-241-6830 or TTY: 201 769 9588 TTY (561)836-0556) to talk to a  trained counselor call 1-800-273-TALK (toll free, 24 hour hotline) go to [facility] [address], [place] [phone]) call the [facility] Crisis Line: [phone] call 911 if you are experiencing a Mental Health or Behavioral Health Crisis or need someone to talk to.  Mliss Creed [facility], BSN RN Care Manager/ Transition of Care [place]/ [facility] [phone]

## 2024-06-03 ENCOUNTER — Ambulatory Visit: Admitting: Cardiology

## 2024-06-04 ENCOUNTER — Inpatient Hospital Stay: Admitting: Family Medicine

## 2024-06-04 ENCOUNTER — Ambulatory Visit: Admitting: Family Medicine

## 2024-06-04 ENCOUNTER — Encounter: Payer: Self-pay | Admitting: Family Medicine

## 2024-06-05 ENCOUNTER — Other Ambulatory Visit (HOSPITAL_COMMUNITY): Payer: Self-pay

## 2024-06-05 ENCOUNTER — Ambulatory Visit

## 2024-06-05 ENCOUNTER — Telehealth: Payer: Self-pay | Admitting: Family Medicine

## 2024-06-05 NOTE — Telephone Encounter (Signed)
 Copied from CRM [id number]. Topic: General - Other >> Jun 05, 2024  9:12 AM Travis F wrote: Reason for CRM: Patient is calling in requesting for Tobias to give her a call. Patient says she had a seizure last night and went to the hospital and was given blood pressure medication. Patient thought it was for seizures but it's for blood pressure. She wants to know if there is another medication she can have.

## 2024-06-05 NOTE — Telephone Encounter (Signed)
 I spoke to pt and she states she was at home last night with one of her neighbors and had a seizure that lasted 25 minutes but refused to go to ED. She states she felt her left arm go numb but now everything is fine. I advised pt go be seen at the ED but she declines and states she is not going since she is fine now. Pt did say if it happens again over the weekend she will go to ED for evaluation.

## 2024-06-08 ENCOUNTER — Ambulatory Visit (INDEPENDENT_AMBULATORY_CARE_PROVIDER_SITE_OTHER): Admitting: Family Medicine

## 2024-06-08 ENCOUNTER — Encounter: Payer: Self-pay | Admitting: Family Medicine

## 2024-06-08 VITALS — BP 124/69 | HR 76 | Temp 98.9°F | Ht 66.0 in | Wt 164.0 lb

## 2024-06-08 DIAGNOSIS — E8809 Other disorders of plasma-protein metabolism, not elsewhere classified: Secondary | ICD-10-CM | POA: Diagnosis not present

## 2024-06-08 DIAGNOSIS — I509 Heart failure, unspecified: Secondary | ICD-10-CM

## 2024-06-08 DIAGNOSIS — I25110 Atherosclerotic heart disease of native coronary artery with unstable angina pectoris: Secondary | ICD-10-CM

## 2024-06-08 DIAGNOSIS — E1159 Type 2 diabetes mellitus with other circulatory complications: Secondary | ICD-10-CM

## 2024-06-08 DIAGNOSIS — Z794 Long term (current) use of insulin: Secondary | ICD-10-CM | POA: Diagnosis not present

## 2024-06-08 DIAGNOSIS — N1832 Chronic kidney disease, stage 3b: Secondary | ICD-10-CM | POA: Diagnosis not present

## 2024-06-08 DIAGNOSIS — E46 Unspecified protein-calorie malnutrition: Secondary | ICD-10-CM | POA: Diagnosis not present

## 2024-06-08 DIAGNOSIS — E1165 Type 2 diabetes mellitus with hyperglycemia: Secondary | ICD-10-CM

## 2024-06-08 DIAGNOSIS — R197 Diarrhea, unspecified: Secondary | ICD-10-CM | POA: Diagnosis not present

## 2024-06-08 DIAGNOSIS — R569 Unspecified convulsions: Secondary | ICD-10-CM

## 2024-06-08 DIAGNOSIS — I152 Hypertension secondary to endocrine disorders: Secondary | ICD-10-CM | POA: Diagnosis not present

## 2024-06-08 LAB — BAYER DCA HB A1C WAIVED: HB A1C (BAYER DCA - WAIVED): 8.5 % — ABNORMAL HIGH (ref 4.8–5.6)

## 2024-06-08 MED ORDER — LEVETIRACETAM 500 MG PO TABS: 500.0000 mg | ORAL_TABLET | Freq: Two times a day (BID) | ORAL | 1 refills | Status: DC

## 2024-06-08 NOTE — Progress Notes (Signed)
 Established Patient Office Visit  Subjective   Patient ID: [name], female    DOB: 11-15-1950  Age: 73 y.o. MRN: [id number]  Chief Complaint  Patient presents with   hospitial follow up    HPI  History of Present Illness   [name] is a 73 year old female with seizures and diabetes who presents with fatigue and diarrhea.  Fatigue - Significant fatigue over the past two days - Exhaustion leading to going to bed as early as 6 PM - Lacks energy for daily activities - Normal B12 levels and receives B12 injections  Diarrhea - Onset after hospital discharge - Liquid consistency, difficult to manage - Requires frequent sheet changes - Imodium provides some relief; Pepto-Bismol ineffective - No abdominal pain or fever - Denies dizziness, lightheadedness - Diarrhea has prevented attendance at church for two weeks  Seizure activity and neurological symptoms - Hospitalized from August 24th to 29th for seizure with altered mental status, slurred speech, and left facial twitching - No new findings on CT scan - EEGs did not capture seizures - No seizure-like activity since hospital discharge - Ongoing episodes of confusion - Neurology follow-up scheduled - Given keppra  in the hospital - Per discharge summary, was to be discharged home with keppra  but she was not given a prescription for this.  Glycemic control and diabetes management - Average glucose level 249 mg/dL, with 27% of readings above target range of 180 mg/dL - Uses mealtime insulin  twice daily usually. Takes 40 units of Tresiba  before bed - Started on Tradjenta  at discharge from hospital - Stopped Ozempic  due to safety concerns - 10 episodes of hypoglycemia in the last month, mostly at night          ROS As per HPI.   Objective:     BP 124/69   Pulse 76   Temp 98.9 F (37.2 C) (Temporal)   Ht 5' 6 (1.676 m)   Wt 164 lb (74.4 kg)   SpO2 98%   BMI 26.47 kg/m    Physical Exam Vitals and  nursing note reviewed.  Constitutional:      General: She is not in acute distress.    Appearance: She is not ill-appearing, toxic-appearing or diaphoretic.  Cardiovascular:     Rate and Rhythm: Normal rate and regular rhythm.     Heart sounds: Normal heart sounds. No murmur heard. Pulmonary:     Effort: Pulmonary effort is normal. No respiratory distress.     Breath sounds: Normal breath sounds. No wheezing, rhonchi or rales.  Chest:     Chest wall: No tenderness.  Musculoskeletal:     Cervical back: Neck supple. No rigidity.     Right lower leg: No edema.     Left lower leg: No edema.  Skin:    General: Skin is warm and dry.  Neurological:     Mental Status: She is alert and oriented to person, place, and time. Mental status is at baseline.     Cranial Nerves: No cranial nerve deficit.     Sensory: No sensory deficit.     Motor: No weakness.     Gait: Gait normal.  Psychiatric:        Mood and Affect: Mood normal.        Behavior: Behavior normal.      No results found for any visits on 06/08/24.    The ASCVD Risk score (Arnett DK, et al., 2019) failed to calculate for the following reasons:  Risk score cannot be calculated because patient has a medical history suggesting prior/existing ASCVD    Assessment & Plan:   [name] was seen today for hospitial follow up.  Diagnoses and all orders for this visit:  Seizures (HCC) -     CBC with Differential/Platelet -     CMP14+EGFR -     levETIRAcetam  (KEPPRA ) 500 MG tablet; Take 1 tablet (500 mg total) by mouth 2 (two) times daily.  Type 2 diabetes mellitus with hyperglycemia, with long-term current use of insulin  (HCC) -     CBC with Differential/Platelet -     CMP14+EGFR -     Bayer DCA Hb A1c Waived  Coronary artery disease involving native coronary artery of native heart with unstable angina pectoris (HCC)  Congestive heart failure, unspecified HF chronicity, unspecified heart failure type  (HCC)  Hypoalbuminemia due to protein-calorie malnutrition (HCC)  Stage 3b chronic kidney disease (HCC)  Hypertension associated with type 2 diabetes mellitus (HCC)  Acute diarrhea -     Cdiff NAA+O+P+Stool Culture      Seizure disorder Seizure on August 24th with altered mental status, slurred speech, and left facial twitching. No seizures recorded during EEG. Neurologist consultation in hospital. Per discharge summary, was to be discharged home with Keppra  but was not - Prescribe Keppra  500 mg twice daily until neurology follow-up. - Advise no driving for at least six months.  Acute diarrhea, possible Clostridioides difficile infection Persistent liquid diarrhea since hospital discharge. Possible C. diff infection due to recent hospitalization. - Order stool culture to test for C. diff. - Advise on fluid intake. - Provide stool culture kit for home collection and return.  Type 2 diabetes mellitus with hyperglycemia Uncontrolled. Average glucose level 249 mg/dL with 27% of readings above target. Experiencing hypoglycemic episodes, especially in the evening, with 10 episodes in the last month. Endocrinology appointment on September 23rd. - Check A1c level. - Message Mliss for further management plan. - Endocrinology appointment scheduled for September 23rd.  Hypertension BP at goal   CHF Euvolemic today     CKD Will recheck labs today.   CAD On statin, aspirin . Established with cardiology.   Total time spent caring for the patient today was 45 minutes. This includes time spent before the visit reviewing the chart, time spent during the visit, and time spent after the visit on documentation.  Return in about 3 months (around 09/07/2024) for chronic follow up.   The patient indicates understanding of these issues and agrees with the plan.  Karen CHRISTELLA Search, FNP

## 2024-06-09 ENCOUNTER — Other Ambulatory Visit: Payer: Self-pay | Admitting: *Deleted

## 2024-06-09 LAB — CMP14+EGFR
ALT: 9 IU/L (ref 0–32)
AST: 10 IU/L (ref 0–40)
Albumin: 3.8 g/dL (ref 3.8–4.8)
Alkaline Phosphatase: 74 IU/L (ref 49–135)
BUN/Creatinine Ratio: 12 (ref 12–28)
BUN: 16 mg/dL (ref 8–27)
Bilirubin Total: 0.4 mg/dL (ref 0.0–1.2)
CO2: 23 mmol/L (ref 20–29)
Calcium: 9.2 mg/dL (ref 8.7–10.3)
Chloride: 103 mmol/L (ref 96–106)
Creatinine, Ser: 1.3 mg/dL — ABNORMAL HIGH (ref 0.57–1.00)
Globulin, Total: 2.7 g/dL (ref 1.5–4.5)
Glucose: 194 mg/dL — ABNORMAL HIGH (ref 70–99)
Potassium: 5.4 mmol/L — ABNORMAL HIGH (ref 3.5–5.2)
Sodium: 140 mmol/L (ref 134–144)
Total Protein: 6.5 g/dL (ref 6.0–8.5)
eGFR: 43 mL/min/1.73 — ABNORMAL LOW (ref >59)

## 2024-06-09 LAB — CBC WITH DIFFERENTIAL/PLATELET
Basophils Absolute: 0 x10E3/uL (ref 0.0–0.2)
Basos: 0 %
EOS (ABSOLUTE): 0.2 x10E3/uL (ref 0.0–0.4)
Eos: 3 %
Hematocrit: 36 % (ref 34.0–46.6)
Hemoglobin: 11.5 g/dL (ref 11.1–15.9)
Immature Grans (Abs): 0 x10E3/uL (ref 0.0–0.1)
Immature Granulocytes: 0 %
Lymphocytes Absolute: 2.6 x10E3/uL (ref 0.7–3.1)
Lymphs: 34 %
MCH: 29.6 pg (ref 26.6–33.0)
MCHC: 31.9 g/dL (ref 31.5–35.7)
MCV: 93 fL (ref 79–97)
Monocytes Absolute: 0.5 x10E3/uL (ref 0.1–0.9)
Monocytes: 6 %
Neutrophils Absolute: 4.4 x10E3/uL (ref 1.4–7.0)
Neutrophils: 57 %
Platelets: 164 x10E3/uL (ref 150–450)
RBC: 3.89 x10E6/uL (ref 3.77–5.28)
RDW: 13.3 % (ref 11.7–15.4)
WBC: 7.7 x10E3/uL (ref 3.4–10.8)

## 2024-06-09 NOTE — Patient Instructions (Signed)
 Visit Information  Thank you for taking time to visit with me today. Please don't hesitate to contact me if I can be of assistance to you before our next scheduled telephone appointment.  Our next appointment is by telephone on 06/17/24 @ 215 pm  Following is a copy of your care plan:   Goals Addressed             This Visit's Progress    VBCI Transitions of Care (TOC) Care Plan       Problems:  Recent Hospitalization for treatment of Seizures 06/02/24 pt reports she has appointment with [facility] on 06/30/24, pt fell this past Saturday, this fall was reported to primary care provider by home health, pt states she was not injured, pt has walker, pt denies any seizure activity 06/09/24- pt saw primary care provider on 06/08/24, discussed chronic diarrhea, will be providing stool specimen, discussed recent episodes of hypoglycemia and diabetic medications reviewed, pt states she will be working with pharmacist, pt states  I eat and drink and blood sugar comes right back up  - hypoglycemia usually happens at night with ranges 59-69, pt denies any seizure activity, is on keppra   Goal:  Over the next 30 days, the patient will not experience hospital readmission  Interventions:  Falls Interventions: Reviewed medications and discussed potential side effects of medications such as dizziness and frequent urination Advised patient of importance of notifying provider of falls Assessed for signs and symptoms of orthostatic hypotension Assessed for falls since last encounter Reviewed safety precautions, importance of using walker  Seizures Interventions: Evaluation of current treatment plan related to seizures, self-management and patient's adherence to plan as established by provider. Discussed plans with patient for ongoing care management follow up and provided patient with direct contact information for care management team Provided education to patient re:  signs/ symptoms seizure activity, importance of reporting to provider Reviewed medications with patient and discussed importance of taking as prescribed Reviewed scheduled/upcoming provider appointments including  9/25 Dr. Lenis endocrinologist, 10/7 neurologist  Advised patient, providing education and rationale, to check cbg as prescribed/ per CGM and record, calling provider for findings outside established parameters  Reviewed signs /symptoms of hypoglycemia and interventions  Patient Self Care Activities:  Attend all scheduled provider appointments Attend church or other social activities Call pharmacy for medication refills 3-7 days in advance of running out of medications Call provider office for new concerns or questions  Notify RN Care Manager of TOC call rescheduling needs Participate in Transition of Care Program/Attend TOC scheduled calls Perform all self care activities independently  Take medications as prescribed   Report any seizure activity or falls to your provider Fall prevention strategies: change positions slowly, use a walker or cane (per provider recommendations) when walking, keep walkways clear, have good lighting in room. It is important to contact your provider if you have any falls. Maintain muscle strength/tone by exercise per provider recommendations.  Follow RULE OF 15 for low blood sugar management:  How to treat low blood sugars (Blood sugar less than 70 mg/dl  Please follow the RULE OF 15 for the treatment of hypoglycemia treatment (When your blood sugars are less than 70 mg/ dl) STEP  1:  Take 15 grams of carbohydrates when your blood sugar is low, which includes One tube of glucose gel STEP 2:  Recheck blood sugar in 15 minutes STEP 3:  3-4 glucose tabs or  3-4 oz of juice or regular soda or If  your blood sugar is still low at the 15 minute recheck ---then, go back to STEP 1 and treat again with another 15 grams of carbohydrates  Plan:  Telephone follow up  appointment with care management team member scheduled for:  06/17/24 @ 215 pm The patient has been provided with contact information for the care management team and has been advised to call with any health related questions or concerns.         Patient verbalizes understanding of instructions and care plan provided today and agrees to view in MyChart. Active MyChart status and patient understanding of how to access instructions and care plan via MyChart confirmed with patient.     Telephone follow up appointment with care management team member scheduled for: 06/17/24 @ 215 pm  Please call the care guide team at [phone] if you need to cancel or reschedule your appointment.   Please call the Suicide and Crisis Lifeline: 988 call the [place]  National Suicide Prevention Lifeline: 929-296-4988 or TTY: 847 702 5352 TTY (231) 435-7834) to talk to a trained counselor call 1-800-273-TALK (toll free, 24 hour hotline) go to [facility] [address] [place] [phone]) call the [facility] Crisis Line: ([phone] call 911 if you are experiencing a Mental Health or Behavioral Health Crisis or need someone to talk to.  Mliss Creed [facility], BSN RN Care Manager/ Transition of Care [place]/ [facility] [phone]

## 2024-06-09 NOTE — Patient Outreach (Signed)
 Transition of Care week 3  Visit Note  06/09/2024  Name: [name] MRN: [id number]          DOB: 1950/12/20  Situation: Patient enrolled in [facility] 30-day program. Visit completed with patient by telephone.   Background:     Past Medical History:  Diagnosis Date   Anxiety    CAD (coronary artery disease)    DES to circumflex 02/2007, BMS to LAD and PTCA diagonal 03/2007   Carotid artery plaque    Mild   Cataract    Depression    Diverticulitis, colon    Elevated d-dimer 01/08/2014   Essential hypertension, benign    GERD (gastroesophageal reflux disease)    H/O hiatal hernia    HLD (hyperlipidemia)    IDDM (insulin  dependent diabetes mellitus)    Migraine    used to have them really bad; don't have them anymore (01/07/2014)   MS (multiple sclerosis) (HCC)    Not confirmed   PAT (paroxysmal atrial tachycardia) (HCC)    Prolapse of uterus    PVD (peripheral vascular disease) (HCC)    TIA (transient ischemic attack) 1980's    Assessment: Patient Reported Symptoms: Cognitive Cognitive Status: No symptoms reported, Alert and oriented to person, place, and time, Normal speech and language skills, Able to follow simple commands      Neurological Neurological Review of Symptoms: No symptoms reported Oher Neurological Symptoms/Conditions [RPT]: pt denies any seizure activity, is on Keppra , pt to follow up with neurology Neurological Management Strategies: Adequate rest, Medication therapy, Routine screening Neurological Self-Management Outcome: 4 (good) Neurological Comment: Reviewed signs/symptoms seizure activity, precautions  HEENT HEENT Symptoms Reported: No symptoms reported      Cardiovascular Cardiovascular Symptoms Reported: No symptoms reported Does patient have uncontrolled Hypertension?: No    Respiratory Respiratory Symptoms Reported: No symptoms reported    Endocrine Endocrine Symptoms Reported: No symptoms reported Is patient diabetic?: Yes Is patient  checking blood sugars at home?: Yes List most recent blood sugar readings, include date and time of day: Freestyle Libre-  FBS today 69, pt states she has had several hypoglycemic episodes over the past week, mostly at night, 59-69, pt states she eats,drinks and CBG returns to normal, pt discussed wtih primary care provider 9/15 and will be working with pharmacist Endocrine Self-Management Outcome: 4 (good) Endocrine Comment: Reviewed signs/ symptoms hypoglycemia and treatment, importance of staying in touch with provider and reporting CBG readings that are consistenly low or high  Gastrointestinal Gastrointestinal Symptoms Reported: No symptoms reported Additional Gastrointestinal Details: pt states she has not had diarrhea this week, had last week but has resolved Gastrointestinal Management Strategies: Adequate rest, Medication therapy Gastrointestinal Self-Management Outcome: 4 (good) Gastrointestinal Comment: reinforced importance of staying well hydrated, pt states she was instructed to take stool specimen to primary care provider office and plans to do so this week    Genitourinary Genitourinary Symptoms Reported: No symptoms reported    Integumentary Integumentary Symptoms Reported: No symptoms reported    Musculoskeletal Musculoskelatal Symptoms Reviewed: Difficulty walking Additional Musculoskeletal Details: walker Musculoskeletal Management Strategies: Medical device, Routine screening Musculoskeletal Self-Management Outcome: 3 (uncertain) Musculoskeletal Comment: reviewed safety precautions      Psychosocial Psychosocial Symptoms Reported: No symptoms reported         There were no vitals filed for this visit.  Medications Reviewed Today     Reviewed by Aura Mliss LABOR, RN (Registered Nurse) on 06/09/24 at 1518  Med List Status: <None>   Medication Order Taking? Sig Documenting  Provider Last Dose Status Informant  acetaminophen  (TYLENOL ) 500 MG tablet [id number]  Take  1,000 mg by mouth every 6 (six) hours as needed for mild pain (pain score 1-3) or headache. [provider]  Active Self, Pharmacy Records  aspirin  81 MG chewable tablet [id number]  Chew 1 tablet (81 mg total) by mouth daily. Davia Nydia POUR, MD  Active Self, Pharmacy Records  Blood Glucose Monitoring Suppl DEVI [id number]  1 each by Does not apply route in the morning, at noon, and at bedtime. May substitute to any manufacturer covered by patient's insurance. [name], FNP  Active Self, Pharmacy Records  clopidogrel  (PLAVIX ) 75 MG tablet [id number]  Take 1 tablet (75 mg total) by mouth daily with breakfast. Pietro Redell RAMAN, MD  Active Self, Pharmacy Records  Continuous Glucose Sensor (FREESTYLE LIBRE 3 PLUS SENSOR) [place] [id number]  Change sensor every 15 days. Apply to back of upper arm. DX: E11.65 [name], FNP  Active Self, Pharmacy Records  cyanocobalamin  (VITAMIN B12) injection 1,000 mcg [id number]   [name], FNP  Active   cyanocobalamin  (VITAMIN B12) injection 1,000 mcg [id number]   [name], FNP  Active   famotidine  (PEPCID ) 20 MG tablet [id number]  Take 1 tablet (20 mg total) by mouth 2 (two) times daily. [name], FNP  Active Self, Pharmacy Records  furosemide  (LASIX ) 20 MG tablet [id number]  TAKE ONE (1) TABLET BY MOUTH EVERY DAY [name], FNP  Active Self, Pharmacy Records  Homeopathic Products (MUSCLE CRAMP COMPLEX PO) [id number]  Take 1 tablet by mouth as needed (muscle spasms). [provider]  Active Self, Pharmacy Records  insulin  aspart (NOVOLOG  FLEXPEN) 100 UNIT/ML FlexPen [id number]  Inject 0-6 Units into the skin 3 (three) times daily with meals. Inject per sliding scale 0-200 = 0 units, if less than 70 notify provider 201-250 = 2 units 251-300 = 3 units 301-350 = 4 units 351-400 = 5 units 401-450 = 6 units >450 = notify provider Ricky Fines, MD  Active Self, Pharmacy Records  insulin  degludec (TRESIBA   FLEXTOUCH) 200 UNIT/ML FlexTouch Pen [id number]  Inject 40 units subcutaneously daily. [name], FNP  Active Self, Pharmacy Records  isosorbide  mononitrate (IMDUR ) 60 MG 24 hr tablet [id number]  Take 1 tablet (60 mg total) by mouth daily. Lavona Agent, MD  Active Self, Pharmacy Records  levETIRAcetam  (KEPPRA ) 500 MG tablet [id number]  Take 1 tablet (500 mg total) by mouth 2 (two) times daily. [name], FNP  Active   linagliptin  (TRADJENTA ) 5 MG TABS tablet [id number]  Take 1 tablet (5 mg total) by mouth daily. Raenelle Donalda HERO, MD  Active   lisinopril  (ZESTRIL ) 10 MG tablet [id number]  Take 1 tablet (10 mg total) by mouth daily. Lavona Agent, MD  Active Self, Pharmacy Records  loratadine  (CLARITIN ) 10 MG tablet [id number]  Take 10 mg by mouth daily as needed for allergies. [provider]  Active Self, Pharmacy Records  metoprolol  tartrate (LOPRESSOR ) 25 MG tablet [id number]  Take 1 tablet (25 mg total) by mouth 2 (two) times daily. [name], FNP  Active Self, Pharmacy Records  nitroGLYCERIN  (NITROSTAT ) 0.4 MG SL tablet [id number]  DISSOLVE 1 TABLET UNDER TONGUE FOR CHESTPAIN.MAY REPEAT EVERY 5 MINUTES FOR 3 DOSES.IF NO RELIEF CALL 911 OR GO TO ER [name], FNP  Active Self, Pharmacy Records  potassium chloride  (KLOR-CON  M) 10 MEQ tablet [id number]  Take 2 tablets (20 mEq total) by mouth 2 (two) times  daily. [name], FNP  Active Self, Pharmacy Records  rOPINIRole  (REQUIP ) 1 MG tablet [id number]  TAKE 1 TABLET BY MOUTH DAILY AS NEEDED [name], FNP  Active Self, Pharmacy Records  rosuvastatin  (CRESTOR ) 20 MG tablet [id number]  TAKE ONE (1) TABLET BY MOUTH EVERY DAY [name], FNP  Active Self, Pharmacy Records  sertraline  (ZOLOFT ) 50 MG tablet [id number]  Take 1.5 tablets (75 mg total) by mouth daily. [name], FNP  Active Self, Pharmacy Records  traZODone  (DESYREL ) 50 MG tablet [id number]  Take 1 tablet (50 mg total) by mouth at  bedtime as needed for sleep. [name], FNP  Active Self, Pharmacy Records            Goals Addressed             This Visit's Progress    VBCI Transitions of Care (TOC) Care Plan       Problems:  Recent Hospitalization for treatment of Seizures 06/02/24 pt reports she has appointment with [facility] on 06/30/24, pt fell this past Saturday, this fall was reported to primary care provider by home health, pt states she was not injured, pt has walker, pt denies any seizure activity 06/09/24- pt saw primary care provider on 06/08/24, discussed chronic diarrhea, will be providing stool specimen, discussed recent episodes of hypoglycemia and diabetic medications reviewed, pt states she will be working with pharmacist, pt states  I eat and drink and blood sugar comes right back up  - hypoglycemia usually happens at night with ranges 59-69, pt denies any seizure activity, is on keppra   Goal:  Over the next 30 days, the patient will not experience hospital readmission  Interventions:  Falls Interventions: Reviewed medications and discussed potential side effects of medications such as dizziness and frequent urination Advised patient of importance of notifying provider of falls Assessed for signs and symptoms of orthostatic hypotension Assessed for falls since last encounter Reviewed safety precautions, importance of using walker  Seizures Interventions: Evaluation of current treatment plan related to seizures, self-management and patient's adherence to plan as established by provider. Discussed plans with patient for ongoing care management follow up and provided patient with direct contact information for care management team Provided education to patient re: signs/ symptoms seizure activity, importance of reporting to provider Reviewed medications with patient and discussed importance of taking as prescribed Reviewed scheduled/upcoming provider  appointments including  9/25 Dr. Lenis endocrinologist, 10/7 neurologist  Advised patient, providing education and rationale, to check cbg as prescribed/ per CGM and record, calling provider for findings outside established parameters  Reviewed signs /symptoms of hypoglycemia and interventions  Patient Self Care Activities:  Attend all scheduled provider appointments Attend church or other social activities Call pharmacy for medication refills 3-7 days in advance of running out of medications Call provider office for new concerns or questions  Notify RN Care Manager of TOC call rescheduling needs Participate in Transition of Care Program/Attend TOC scheduled calls Perform all self care activities independently  Take medications as prescribed   Report any seizure activity or falls to your provider Fall prevention strategies: change positions slowly, use a walker or cane (per provider recommendations) when walking, keep walkways clear, have good lighting in room. It is important to contact your provider if you have any falls. Maintain muscle strength/tone by exercise per provider recommendations.  Follow RULE OF 15 for low blood sugar management:  How to treat low blood sugars (Blood sugar less than  70 mg/dl  Please follow the RULE OF 15 for the treatment of hypoglycemia treatment (When your blood sugars are less than 70 mg/ dl) STEP  1:  Take 15 grams of carbohydrates when your blood sugar is low, which includes One tube of glucose gel STEP 2:  Recheck blood sugar in 15 minutes STEP 3:  3-4 glucose tabs or  3-4 oz of juice or regular soda or If your blood sugar is still low at the 15 minute recheck ---then, go back to STEP 1 and treat again with another 15 grams of carbohydrates  Plan:  Telephone follow up appointment with care management team member scheduled for:  06/17/24 @ 215 pm The patient has been provided with contact information for the care management team and has been advised to call  with any health related questions or concerns.         Recommendation:   PCP Follow-up Specialty provider follow-up Dr. Lenis  Follow Up Plan:   Telephone follow-up 06/17/24 @ 215 pm  Mliss Creed [facility], BSN RN Care Manager/ Transition of Care [place]/ [facility] [phone]

## 2024-06-10 ENCOUNTER — Ambulatory Visit: Payer: Self-pay | Admitting: Family Medicine

## 2024-06-10 DIAGNOSIS — E875 Hyperkalemia: Secondary | ICD-10-CM

## 2024-06-11 ENCOUNTER — Other Ambulatory Visit

## 2024-06-11 DIAGNOSIS — E876 Hypokalemia: Secondary | ICD-10-CM | POA: Diagnosis not present

## 2024-06-11 DIAGNOSIS — E875 Hyperkalemia: Secondary | ICD-10-CM | POA: Diagnosis not present

## 2024-06-11 LAB — CMP14+EGFR
ALT: 6 IU/L (ref 0–32)
AST: 11 IU/L (ref 0–40)
Albumin: 3.6 g/dL — ABNORMAL LOW (ref 3.8–4.8)
Alkaline Phosphatase: 75 IU/L (ref 49–135)
BUN/Creatinine Ratio: 8 — ABNORMAL LOW (ref 12–28)
BUN: 10 mg/dL (ref 8–27)
Bilirubin Total: 0.3 mg/dL (ref 0.0–1.2)
CO2: 23 mmol/L (ref 20–29)
Calcium: 9 mg/dL (ref 8.7–10.3)
Chloride: 101 mmol/L (ref 96–106)
Creatinine, Ser: 1.18 mg/dL — ABNORMAL HIGH (ref 0.57–1.00)
Globulin, Total: 2.6 g/dL (ref 1.5–4.5)
Glucose: 269 mg/dL — ABNORMAL HIGH (ref 70–99)
Potassium: 4.4 mmol/L (ref 3.5–5.2)
Sodium: 138 mmol/L (ref 134–144)
Total Protein: 6.2 g/dL (ref 6.0–8.5)
eGFR: 48 mL/min/1.73 — ABNORMAL LOW (ref >59)

## 2024-06-12 ENCOUNTER — Other Ambulatory Visit

## 2024-06-12 ENCOUNTER — Ambulatory Visit: Payer: Self-pay | Admitting: Family Medicine

## 2024-06-12 NOTE — Progress Notes (Unsigned)
 06/12/2024 Name: [name] MRN: [id number] DOB: 11-20-1950  No chief complaint on file.   [name] is a 73 y.o. year old female who presented for a telephone visit.  I connected with  [name] on 04/16/24 by telephone and verified that I am speaking with the correct person using two identifiers. I discussed the limitations of evaluation and management by telemedicine. The patient expressed understanding and agreed to proceed.  Patient was located in her home and PharmD in PCP office during this visit   They were referred to the pharmacist by their PCP for assistance in managing diabetes and medication access.    Subjective:  Referral from PCP due to patient with recent seizures and uncontrolled diabetes.  Patient has also been experiencing diarrhea and fatigue.  She was referred to endocrinology and has her initial appt on 06/18/24  She does not have as much energy as she used to.   She has her medications at home (some via patient assistance), but doesn't like taking so much medicine.  She reports libre CGM readings of 200-300.  She will have the occasional low blood sugar (around 69) over night (potential compression low).  She is still enrolled in the novo nordisk patient assistance program (Tresiba /Novolog /Ozempic ).   Care Team: Primary Care Provider: [name], FNP 06/04/24 Endocrinologist-Dr. Lenis 06/18/24  Medication Access/Adherence  Current Pharmacy:  [facility] [place], [place] - [address] [place] [address] Phone: [phone] Fax: [phone]  Patient reports affordability concerns with their medications: Yes  Patient reports access/transportation concerns to their pharmacy: No  Patient reports adherence concerns with their medications:  No    Diabetes:  Current medications: Lantus  25-30 units daily; Novolog  0-6 units 3 times daily (often skips doses); Ozempic  0.5mg  weekly--seems to be tolerating okay Medications  tried in the past: stopped Ozempic  due to dull stomach pains. Jardiance , metformin    Now Using FSL3 PLUS CGM (uses reader device;mobile not compatible)  Current glucose readings:  Not able to read out all of the CGM details Date of Download: 04/16/24 % Time CGM is active: n/a% Average Glucose: 203 mg/dL Glucose Management Indicator: n/a  Time in Goal:  - Time in range 70-180: 43% - Time above range: 56% - Time below range: 1% (asymptomatic, potential compression lows) Observed patterns: post prandial and AM hyperglycemia   Current meal patterns:  Reports she only eats 1-2 meals daily Encouraged 2-3 healthy plate meals daily to keep blood sugar stable   Current physical activity: limited    Current medication access support: novo nordisk PAP (Tresiba /Novolog /Ozempic )   Objective:  Lab Results  Component Value Date   HGBA1C 8.5 (H) 06/08/2024    Lab Results  Component Value Date   CREATININE 1.18 (H) 06/11/2024   BUN 10 06/11/2024   NA 138 06/11/2024   K 4.4 06/11/2024   CL 101 06/11/2024   CO2 23 06/11/2024    Lab Results  Component Value Date   CHOL 104 01/14/2024   HDL 42 01/14/2024   LDLCALC 49 01/14/2024   LDLDIRECT 93 04/06/2015   TRIG 67 01/14/2024   CHOLHDL 2.5 01/14/2024    Medications Reviewed Today   Medications were not reviewed in this encounter     Assessment/Plan:   Diabetes: - Currently uncontrolled--patient does not want to be on multiple medications/injections - Reviewed long term cardiovascular and renal outcomes of uncontrolled blood sugar - Reviewed goal A1c, goal fasting, and goal 2  hour post prandial glucose - Reviewed dietary modifications including FOLLOWING A HEART HEALTHY DIET/HEALTHY PLATE METHOD - Reviewed lifestyle modifications including:  - Recommend to : INCREASED Ozempic  to 0.5mg  weekly (reports she has the red boxes at home, tolerating okay) CONTINUE Tresiba  (patient injecting anywhere from 3-40 units DAILY) CONTINUE  all other medications as prescribed Encouraged compliance of all medications (often skips meal time insulin  due to fear of hypoglycemia, doesn't want to take medicine) - Patient denies personal or family history of multiple endocrine neoplasia type 2, medullary thyroid  cancer; personal history of pancreatitis or gallbladder disease. - Recommend to check glucose using libre FSL3-->3+ - Enrolled in novo nordisk PAP for Tresiba  and Novolog    Follow Up Plan: PCP 06/04/24, Cardiology 06/03/24  Mliss Tarry Griffin, PharmD, BCACP, CPP Clinical Pharmacist, [facility]

## 2024-06-16 NOTE — Progress Notes (Signed)
 Erskin Clarity,   I had this patient on my list of reschedules with RNCM Rosina Forte but I show you are currently working with patient for [facility] services, so I didn't reschedule pt. Please reconnect if needed to [facility].  Thank you, Shereen Gin [facility] VBCI Assistant Direct Dial: [phone]  Fax: [phone] Website: [email or web address]

## 2024-06-17 ENCOUNTER — Other Ambulatory Visit: Payer: Self-pay | Admitting: *Deleted

## 2024-06-17 NOTE — Patient Outreach (Signed)
 Transition of Care week 4  Visit Note  06/17/2024  Name: [name] MRN: [id number]          DOB: 03-11-1951  Situation: Patient enrolled in [facility] 30-day program. Visit completed with patient by telephone.   Background:    Past Medical History:  Diagnosis Date   Anxiety    CAD (coronary artery disease)    DES to circumflex 02/2007, BMS to LAD and PTCA diagonal 03/2007   Carotid artery plaque    Mild   Cataract    Depression    Diverticulitis, colon    Elevated d-dimer 01/08/2014   Essential hypertension, benign    GERD (gastroesophageal reflux disease)    H/O hiatal hernia    HLD (hyperlipidemia)    IDDM (insulin  dependent diabetes mellitus)    Migraine    used to have them really bad; don't have them anymore (01/07/2014)   MS (multiple sclerosis)    Not confirmed   PAT (paroxysmal atrial tachycardia)    Prolapse of uterus    PVD (peripheral vascular disease)    TIA (transient ischemic attack) 1980's    Assessment: Patient Reported Symptoms: Cognitive Cognitive Status: No symptoms reported, Alert and oriented to person, place, and time, Normal speech and language skills, Able to follow simple commands      Neurological Neurological Review of Symptoms: No symptoms reported [place] Neurological Symptoms/Conditions [RPT]: pt denies any seizure activity, is on keppra , follows up with neurology Neurological Management Strategies: Adequate rest, Medication therapy Neurological Self-Management Outcome: 4 (good) Neurological Comment: Reinforced signs/ symptoms seizure activity  HEENT HEENT Symptoms Reported: No symptoms reported      Cardiovascular Cardiovascular Symptoms Reported: No symptoms reported Does patient have uncontrolled Hypertension?: No    Respiratory Respiratory Symptoms Reported: No symptoms reported    Endocrine Endocrine Symptoms Reported: No symptoms reported Is patient diabetic?: Yes List most recent blood sugar readings, include date and time of day:  Freestyle Libre-  pt continues having approximately 2-3 hypoglycemic episodes per week with readings 50-60's range happening around 4-5 am, CGM alarms, pt eats and drinks with correction,  primary care provider aware and pt works with pharmacist, pt will be seeing endocrinologist on 9/25-  RN CM sent in basket message to Dr. Lenis endocrinologist reporting hypoglycemic episodes Endocrine Self-Management Outcome: 4 (good) Endocrine Comment: Reinforced signs/ symptoms hypoglycemia and treatment, importance of staying in touch with provider and reporting CBG readings that are consistently low or high  Gastrointestinal Gastrointestinal Symptoms Reported: No symptoms reported Additional Gastrointestinal Details: pt reports diarrhea is resolved      Genitourinary Genitourinary Symptoms Reported: No symptoms reported    Integumentary Integumentary Symptoms Reported: No symptoms reported    Musculoskeletal Musculoskelatal Symptoms Reviewed: Difficulty walking Additional Musculoskeletal Details: walker Musculoskeletal Management Strategies: Medical device, Routine screening Musculoskeletal Self-Management Outcome: 4 (good) Musculoskeletal Comment: reinforced safety precautions      Psychosocial Psychosocial Symptoms Reported: No symptoms reported         There were no vitals filed for this visit.  Medications Reviewed Today     Reviewed by Aura Mliss LABOR, RN (Registered Nurse) on 06/17/24 at 1438  Med List Status: <None>   Medication Order Taking? Sig Documenting Provider Last Dose Status Informant  acetaminophen  (TYLENOL ) 500 MG tablet [id number]  Take 1,000 mg by mouth every 6 (six) hours as needed for mild pain (pain score 1-3) or headache. [provider]  Active Self, Pharmacy Records  aspirin  81 MG chewable tablet [id number]  Chew 1  tablet (81 mg total) by mouth daily. Davia Nydia POUR, MD  Active Self, Pharmacy Records  Blood Glucose Monitoring Suppl DEVI [id number]  1 each by Does  not apply route in the morning, at noon, and at bedtime. May substitute to any manufacturer covered by patient's insurance. [name], FNP  Active Self, Pharmacy Records  clopidogrel  (PLAVIX ) 75 MG tablet [id number]  Take 1 tablet (75 mg total) by mouth daily with breakfast. Pietro Redell RAMAN, MD  Active Self, Pharmacy Records  Continuous Glucose Sensor (FREESTYLE LIBRE 3 PLUS SENSOR) [place] [id number]  Change sensor every 15 days. Apply to back of upper arm. DX: E11.65 [name], FNP  Active Self, Pharmacy Records  cyanocobalamin  (VITAMIN B12) injection 1,000 mcg [id number]   [name], FNP  Active   cyanocobalamin  (VITAMIN B12) injection 1,000 mcg [id number]   [name], FNP  Active   famotidine  (PEPCID ) 20 MG tablet [id number]  Take 1 tablet (20 mg total) by mouth 2 (two) times daily. [name], FNP  Active Self, Pharmacy Records  furosemide  (LASIX ) 20 MG tablet [id number]  TAKE ONE (1) TABLET BY MOUTH EVERY DAY [name], FNP  Active Self, Pharmacy Records  Homeopathic Products (MUSCLE CRAMP COMPLEX PO) [id number]  Take 1 tablet by mouth as needed (muscle spasms). [provider]  Active Self, Pharmacy Records  insulin  aspart (NOVOLOG  FLEXPEN) 100 UNIT/ML FlexPen [id number]  Inject 0-6 Units into the skin 3 (three) times daily with meals. Inject per sliding scale 0-200 = 0 units, if less than 70 notify provider 201-250 = 2 units 251-300 = 3 units 301-350 = 4 units 351-400 = 5 units 401-450 = 6 units >450 = notify provider Ricky Fines, MD  Active Self, Pharmacy Records  insulin  degludec (TRESIBA  FLEXTOUCH) 200 UNIT/ML FlexTouch Pen [id number]  Inject 40 units subcutaneously daily. [name], FNP  Active Self, Pharmacy Records  isosorbide  mononitrate (IMDUR ) 60 MG 24 hr tablet [id number]  Take 1 tablet (60 mg total) by mouth daily. Lavona Agent, MD  Active Self, Pharmacy Records  levETIRAcetam  (KEPPRA ) 500 MG tablet [id number]  Take 1  tablet (500 mg total) by mouth 2 (two) times daily. [name], FNP  Active   linagliptin  (TRADJENTA ) 5 MG TABS tablet [id number]  Take 1 tablet (5 mg total) by mouth daily. Raenelle Donalda HERO, MD  Active   lisinopril  (ZESTRIL ) 10 MG tablet [id number]  Take 1 tablet (10 mg total) by mouth daily. Lavona Agent, MD  Active Self, Pharmacy Records  loratadine  (CLARITIN ) 10 MG tablet [id number]  Take 10 mg by mouth daily as needed for allergies. [provider]  Active Self, Pharmacy Records  metoprolol  tartrate (LOPRESSOR ) 25 MG tablet [id number]  Take 1 tablet (25 mg total) by mouth 2 (two) times daily. [name], FNP  Active Self, Pharmacy Records  nitroGLYCERIN  (NITROSTAT ) 0.4 MG SL tablet [id number]  DISSOLVE 1 TABLET UNDER TONGUE FOR CHESTPAIN.MAY REPEAT EVERY 5 MINUTES FOR 3 DOSES.IF NO RELIEF CALL 911 OR GO TO ER [name], FNP  Active Self, Pharmacy Records  potassium chloride  (KLOR-CON  M) 10 MEQ tablet [id number]  Take 2 tablets (20 mEq total) by mouth 2 (two) times daily. [name], FNP  Active Self, Pharmacy Records  rOPINIRole  (REQUIP ) 1 MG tablet [id number]  TAKE 1 TABLET BY MOUTH DAILY AS NEEDED [name], FNP  Active Self, Pharmacy Records  rosuvastatin  (CRESTOR ) 20 MG tablet [id number]  TAKE ONE (1) TABLET BY MOUTH EVERY  MICHAE Joesph Annabella CHRISTELLA, FNP  Active Self, Pharmacy Records  sertraline  (ZOLOFT ) 50 MG tablet [id number]  Take 1.5 tablets (75 mg total) by mouth daily. Joesph Annabella CHRISTELLA, FNP  Active Self, Pharmacy Records  traZODone  (DESYREL ) 50 MG tablet [id number]  Take 1 tablet (50 mg total) by mouth at bedtime as needed for sleep. Joesph Annabella CHRISTELLA, FNP  Active Self, Pharmacy Records             Goals Addressed             This Visit's Progress    VBCI Transitions of Care (TOC) Care Plan       Problems:  Recent Hospitalization for treatment of Seizures 06/02/24 pt reports she has appointment with [facility] on 06/30/24, pt fell this past Saturday, this fall was reported to primary care provider by home health, pt states she was not injured, pt has walker, pt denies any seizure activity 06/09/24- pt saw primary care provider on 06/08/24, discussed chronic diarrhea, will be providing stool specimen, discussed recent episodes of hypoglycemia and diabetic medications reviewed, pt states she will be working with pharmacist, pt states  I eat and drink and blood sugar comes right back up  - hypoglycemia usually happens at night with ranges 59-69, pt denies any seizure activity, is on keppra  06/17/24- spoke with pt who reports diarrhea is resolved, continues to have hypoglycemia 2-3 x per week, 50-60's range, eating and drinking corrects, CGM alarms and wakes pt, pt denies any seizure activity  Goal:  Over the next 30 days, the patient will not experience hospital readmission  Interventions:  Falls Interventions: Reviewed medications and discussed potential side effects of medications such as dizziness and frequent urination Advised patient of importance of notifying provider of falls Assessed for signs and symptoms of orthostatic hypotension Assessed for falls since last encounter Reinforced safety precautions, importance of using walker  Seizures Interventions: Evaluation of current treatment plan related to seizures, self-management and patient's adherence to plan as established by provider. Discussed plans with patient for ongoing care management follow up and provided patient with direct contact information for care management team Provided education to patient re: signs/ symptoms seizure activity, importance of reporting to provider Reviewed medications with patient and discussed importance of taking as prescribed Reviewed scheduled/upcoming provider appointments including  9/25 Dr. Lenis endocrinologist, 10/7 neurologist  Advised patient, providing education and rationale, to check cbg as  prescribed/ per CGM and record, calling provider for findings outside established parameters  Reinforced signs /symptoms of hypoglycemia and interventions In basket message sent to endocrinologist Dr. Lenis reporting hypoglycemic episodes  Patient Self Care Activities:  Attend all scheduled provider appointments Attend church or other social activities Call pharmacy for medication refills 3-7 days in advance of running out of medications Call provider office for new concerns or questions  Notify RN Care Manager of TOC call rescheduling needs Participate in Transition of Care Program/Attend [facility] scheduled calls Perform all self care activities independently  Take medications as prescribed   Report any seizure activity or falls to your provider Follow up with Dr. Lenis on 06/18/24 Fall prevention strategies: change positions slowly, use a walker or cane (per provider recommendations) when walking, keep walkways clear, have good lighting in room. It is important to contact your provider if you have any falls. Maintain muscle strength/tone by exercise per provider recommendations.  Follow RULE OF 15 for low blood sugar management:  How to treat low blood sugars (Blood sugar  less than 70 mg/dl  Please follow the RULE OF 15 for the treatment of hypoglycemia treatment (When your blood sugars are less than 70 mg/ dl) STEP  1:  Take 15 grams of carbohydrates when your blood sugar is low, which includes One tube of glucose gel STEP 2:  Recheck blood sugar in 15 minutes STEP 3:  3-4 glucose tabs or  3-4 oz of juice or regular soda or If your blood sugar is still low at the 15 minute recheck ---then, go back to STEP 1 and treat again with another 15 grams of carbohydrates  Plan:  Telephone follow up appointment with care management team member scheduled for:  06/25/24 @ 215 pm The patient has been provided with contact information for the care management team and has been advised to call with any health  related questions or concerns.         Recommendation:   PCP Follow-up Specialty provider follow-up Dr. Lenis 06/18/24  Follow Up Plan:   Telephone follow-up 06/25/24 @ 215 pm  Mliss Creed [facility], BSN RN Care Manager/ Transition of Care [place]/ [facility] [phone]

## 2024-06-17 NOTE — Patient Instructions (Signed)
 Visit Information  Thank you for taking time to visit with me today. Please don't hesitate to contact me if I can be of assistance to you before our next scheduled telephone appointment.  Our next appointment is by telephone on 06/25/24 @ 215 pm  Following is a copy of your care plan:   Goals Addressed             This Visit's Progress    VBCI Transitions of Care (TOC) Care Plan       Problems:  Recent Hospitalization for treatment of Seizures 06/02/24 pt reports she has appointment with [facility] on 06/30/24, pt fell this past Saturday, this fall was reported to primary care provider by home health, pt states she was not injured, pt has walker, pt denies any seizure activity 06/09/24- pt saw primary care provider on 06/08/24, discussed chronic diarrhea, will be providing stool specimen, discussed recent episodes of hypoglycemia and diabetic medications reviewed, pt states she will be working with pharmacist, pt states  I eat and drink and blood sugar comes right back up  - hypoglycemia usually happens at night with ranges 59-69, pt denies any seizure activity, is on keppra  06/17/24- spoke with pt who reports diarrhea is resolved, continues to have hypoglycemia 2-3 x per week, 50-60's range, eating and drinking corrects, CGM alarms and wakes pt, pt denies any seizure activity  Goal:  Over the next 30 days, the patient will not experience hospital readmission  Interventions:  Falls Interventions: Reviewed medications and discussed potential side effects of medications such as dizziness and frequent urination Advised patient of importance of notifying provider of falls Assessed for signs and symptoms of orthostatic hypotension Assessed for falls since last encounter Reinforced safety precautions, importance of using walker  Seizures Interventions: Evaluation of current treatment plan related to seizures, self-management and patient's adherence to plan as  established by provider. Discussed plans with patient for ongoing care management follow up and provided patient with direct contact information for care management team Provided education to patient re: signs/ symptoms seizure activity, importance of reporting to provider Reviewed medications with patient and discussed importance of taking as prescribed Reviewed scheduled/upcoming provider appointments including  9/25 Dr. Lenis endocrinologist, 10/7 neurologist  Advised patient, providing education and rationale, to check cbg as prescribed/ per CGM and record, calling provider for findings outside established parameters  Reinforced signs /symptoms of hypoglycemia and interventions In basket message sent to endocrinologist Dr. Lenis reporting hypoglycemic episodes  Patient Self Care Activities:  Attend all scheduled provider appointments Attend church or other social activities Call pharmacy for medication refills 3-7 days in advance of running out of medications Call provider office for new concerns or questions  Notify RN Care Manager of TOC call rescheduling needs Participate in Transition of Care Program/Attend [facility] scheduled calls Perform all self care activities independently  Take medications as prescribed   Report any seizure activity or falls to your provider Follow up with Dr. Lenis on 06/18/24 Fall prevention strategies: change positions slowly, use a walker or cane (per provider recommendations) when walking, keep walkways clear, have good lighting in room. It is important to contact your provider if you have any falls. Maintain muscle strength/tone by exercise per provider recommendations.  Follow RULE OF 15 for low blood sugar management:  How to treat low blood sugars (Blood sugar less than 70 mg/dl  Please follow the RULE OF 15 for the treatment of hypoglycemia treatment (When your blood sugars are less than  70 mg/ dl) STEP  1:  Take 15 grams of carbohydrates when your blood sugar  is low, which includes One tube of glucose gel STEP 2:  Recheck blood sugar in 15 minutes STEP 3:  3-4 glucose tabs or  3-4 oz of juice or regular soda or If your blood sugar is still low at the 15 minute recheck ---then, go back to STEP 1 and treat again with another 15 grams of carbohydrates  Plan:  Telephone follow up appointment with care management team member scheduled for:  06/25/24 @ 215 pm The patient has been provided with contact information for the care management team and has been advised to call with any health related questions or concerns.         Patient verbalizes understanding of instructions and care plan provided today and agrees to view in MyChart. Active MyChart status and patient understanding of how to access instructions and care plan via MyChart confirmed with patient.     Telephone follow up appointment with care management team member scheduled for:  06/25/24 @ 215 pm  Please call the care guide team at [phone] if you need to cancel or reschedule your appointment.   Please call the Suicide and Crisis Lifeline: 988 call the [place]  National Suicide Prevention Lifeline: (904)095-5266 or TTY: 972-434-0151 TTY 858-206-2292) to talk to a trained counselor call 1-800-273-TALK (toll free, 24 hour hotline) go to [facility] [address] [place] [phone]) call the [facility] Crisis Line: 445-498-6295 call 911 if you are experiencing a Mental Health or Behavioral Health Crisis or need someone to talk to.  Mliss Creed [facility], BSN RN Care Manager/ Transition of Care [place]/ [facility] [phone]

## 2024-06-18 ENCOUNTER — Ambulatory Visit: Admitting: "Endocrinology

## 2024-06-18 ENCOUNTER — Encounter: Payer: Self-pay | Admitting: "Endocrinology

## 2024-06-18 ENCOUNTER — Ambulatory Visit: Payer: Self-pay

## 2024-06-18 VITALS — BP 132/70 | HR 72 | Ht 66.0 in | Wt 165.4 lb

## 2024-06-18 DIAGNOSIS — E1122 Type 2 diabetes mellitus with diabetic chronic kidney disease: Secondary | ICD-10-CM

## 2024-06-18 DIAGNOSIS — Z794 Long term (current) use of insulin: Secondary | ICD-10-CM | POA: Insufficient documentation

## 2024-06-18 DIAGNOSIS — M8580 Other specified disorders of bone density and structure, unspecified site: Secondary | ICD-10-CM

## 2024-06-18 DIAGNOSIS — I10 Essential (primary) hypertension: Secondary | ICD-10-CM | POA: Insufficient documentation

## 2024-06-18 DIAGNOSIS — E119 Type 2 diabetes mellitus without complications: Secondary | ICD-10-CM | POA: Diagnosis not present

## 2024-06-18 DIAGNOSIS — Z23 Encounter for immunization: Secondary | ICD-10-CM | POA: Diagnosis not present

## 2024-06-18 DIAGNOSIS — E782 Mixed hyperlipidemia: Secondary | ICD-10-CM

## 2024-06-18 DIAGNOSIS — N1831 Chronic kidney disease, stage 3a: Secondary | ICD-10-CM | POA: Diagnosis not present

## 2024-06-18 MED ORDER — EMPAGLIFLOZIN 10 MG PO TABS: 10.0000 mg | ORAL_TABLET | Freq: Every day | ORAL | 1 refills | Status: DC

## 2024-06-18 MED ORDER — TRESIBA FLEXTOUCH 200 UNIT/ML ~~LOC~~ SOPN: 30.0000 [IU] | PEN_INJECTOR | Freq: Every day | SUBCUTANEOUS | 1 refills | Status: DC

## 2024-06-18 NOTE — Telephone Encounter (Signed)
 FYI Only or Action Required?: Action required by provider: referral request and update on patient condition.  Patient was last seen in primary care on 06/08/2024 by [name], FNP.  Called Nurse Triage reporting Blood Sugar Problem.  Symptoms began chronic.  Interventions attempted: Prescription medications: Novolog .  Symptoms are: unchanged.  Triage Disposition: Call PCP Now  Patient/caregiver understands and will follow disposition?: Yes        Copied from CRM #[id number]. Topic: Clinical - Red Word Triage >> Jun 18, 2024  3:07 PM Tinnie BROCKS wrote: Red Word that prompted transfer to Nurse Triage: Leah with [facility] is calling to report patient blood sugar of 364. Reason for Disposition  [1] Blood glucose > 300 mg/dL (83.2 mmol/L) AND [7] two or more times in a row    Home Health Clinician (PT) calling reporting BS levels. Pt can be heard in the background. Clinician reports that she had appt with Endo and pt is unsure about what her POC is-- Clinician reports that recent hospitalization with discussion of insulin  pump but reports that today's visit she is to start Jardiance .   Clincian did call specialist office and LVM.  Clinician also requesting Skilled Nursing Services d/t memory concerns.  Fax:  [phone]  Answer Assessment - Initial Assessment Questions 1. BLOOD GLUCOSE: What is your blood glucose level?      364 a few minutes 2. ONSET: When did you check the blood glucose?     With Libra sensor Last meal was 1300 Recently saw Endo this AM - Home Health clinician reports already attempting to reach out to specialist  3. USUAL RANGE: What is your glucose level usually? (e.g., usual fasting morning value, usual evening value)     Reports this is her baseline 4. KETONES: Do you check for ketones (urine or blood test strips)? If Yes, ask: What does the test show now?      Does not have ketone strips. 5. TYPE 1 or 2:  Do you know what type  of diabetes you have?  (e.g., Type 1, Type 2, Gestational; doesn't know)      2 6. INSULIN : Do you take insulin ? What type of insulin (s) do you use? What is the mode of delivery? (syringe, pen; injection or pump)?      1505: Yes, took 4 units Novolog  7. DIABETES PILLS: Do you take any pills for your diabetes? If Yes, ask: Have you missed taking any pills recently?     Reports endo just started jardiance  8. OTHER SYMPTOMS: Do you have any symptoms? (e.g., fever, frequent urination, difficulty breathing, dizziness, weakness, vomiting)     denies 9. PREGNANCY: Is there any chance you are pregnant? When was your last menstrual period?     N/a  Protocols used: Diabetes - High Blood Sugar-A-AH

## 2024-06-18 NOTE — Telephone Encounter (Signed)
 Endo will address the elevated glucose levels. Will route to [facility] regarding Skilled [employer].

## 2024-06-18 NOTE — Patient Instructions (Signed)

## 2024-06-18 NOTE — Progress Notes (Signed)
 Endocrinology Consult Note       06/18/2024, 11:53 AM   Subjective:    Patient ID: [name], female    DOB: September 11, 1951.  [name] is being seen in consultation for management of currently uncontrolled symptomatic diabetes requested by  [name], FNP.   Past Medical History:  Diagnosis Date   Anxiety    CAD (coronary artery disease)    DES to circumflex 02/2007, BMS to LAD and PTCA diagonal 03/2007   Carotid artery plaque    Mild   Cataract    Depression    Diverticulitis, colon    Elevated d-dimer 01/08/2014   Essential hypertension, benign    GERD (gastroesophageal reflux disease)    H/O hiatal hernia    HLD (hyperlipidemia)    IDDM (insulin  dependent diabetes mellitus)    Migraine    used to have them really bad; don't have them anymore (01/07/2014)   MS (multiple sclerosis)    Not confirmed   PAT (paroxysmal atrial tachycardia)    Prolapse of uterus    PVD (peripheral vascular disease)    TIA (transient ischemic attack) 1980's    Past Surgical History:  Procedure Laterality Date   ABDOMINAL HYSTERECTOMY  1986   ovaries remain - prolaspe uterus    APPENDECTOMY  ~ 1970   BREAST BIOPSY Right 1980's   BREAST LUMPECTOMY Right 1980's   Dr. MYRTIS Salt    CARDIAC CATHETERIZATION  01/07/2014   CHOLECYSTECTOMY  ?1987   COLONOSCOPY  2002   Dr. Anwar--> Severe diverticular changes in the region of the sigmoid and descending colon with scattered diverticular changes throughout the rest of the colon. No polyps, ulcerations. Despite numerous manipulations, the tip of the scope could not be tipped into the cecal area.   COLONOSCOPY  01/10/2012   Procedure: COLONOSCOPY;  Surgeon: Lamar HERO Hollingshead, MD;  Location: [facility];  Service: Endoscopy;  Laterality: N/A;  1:55   CORONARY ANGIOPLASTY WITH STENT PLACEMENT  ~ 1997 X 2   2 + 1   CORONARY ARTERY BYPASS GRAFT N/A 02/19/2023    Procedure: OFF PUMP CORONARY ARTERY BYPASS GRAFTING (CABG) X 1;  Surgeon: Shyrl Linnie KIDD, MD;  Location: [facility] OR;  Service: Open Heart Surgery;  Laterality: N/A;  LIMA TO LAD   CORONARY BALLOON ANGIOPLASTY N/A 10/05/2022   Procedure: CORONARY BALLOON ANGIOPLASTY;  Surgeon: Wonda Sharper, MD;  Location: [facility] INVASIVE CV LAB;  Service: Cardiovascular;  Laterality: N/A;   CORONARY PRESSURE/FFR STUDY N/A 03/08/2017   Procedure: Intravascular Pressure Wire/FFR Study;  Surgeon: Mady Bruckner, MD;  Location: [facility] INVASIVE CV LAB;  Service: Cardiovascular;  Laterality: N/A;   CORONARY STENT INTERVENTION N/A 03/26/2022   Procedure: CORONARY STENT INTERVENTION;  Surgeon: Court Dorn PARAS, MD;  Location: [facility] INVASIVE CV LAB;  Service: Cardiovascular;  Laterality: N/A;   EYE SURGERY Bilateral 2014   cataract   LEFT HEART CATH AND CORONARY ANGIOGRAPHY N/A 03/08/2017   Procedure: Left Heart Cath and Coronary Angiography;  Surgeon: Mady Bruckner, MD;  Location: [facility] INVASIVE CV LAB;  Service: Cardiovascular;  Laterality: N/A;   LEFT HEART CATH AND CORONARY  ANGIOGRAPHY N/A 03/26/2022   Procedure: LEFT HEART CATH AND CORONARY ANGIOGRAPHY;  Surgeon: Court Dorn PARAS, MD;  Location: [facility] INVASIVE CV LAB;  Service: Cardiovascular;  Laterality: N/A;   LEFT HEART CATH AND CORONARY ANGIOGRAPHY N/A 10/05/2022   Procedure: LEFT HEART CATH AND CORONARY ANGIOGRAPHY;  Surgeon: Wonda Sharper, MD;  Location: [facility] INVASIVE CV LAB;  Service: Cardiovascular;  Laterality: N/A;   LEFT HEART CATH AND CORONARY ANGIOGRAPHY N/A 02/14/2023   Procedure: LEFT HEART CATH AND CORONARY ANGIOGRAPHY;  Surgeon: Swazil[place], Peter M, MD;  Location: [facility] INVASIVE CV LAB;  Service: Cardiovascular;  Laterality: N/A;   LEFT HEART CATHETERIZATION WITH CORONARY ANGIOGRAM N/A 01/07/2014   Procedure: LEFT HEART CATHETERIZATION WITH CORONARY ANGIOGRAM;  Surgeon: Ezra GORMAN Shuck, MD;  Location: [facility] CATH LAB;  Service: Cardiovascular;  Laterality: N/A;   TEE WITHOUT  CARDIOVERSION N/A 02/19/2023   Procedure: TRANSESOPHAGEAL ECHOCARDIOGRAM;  Surgeon: Shyrl Linnie KIDD, MD;  Location: [facility] OR;  Service: Open Heart Surgery;  Laterality: N/A;    Social History   Socioeconomic History   Marital status: Widowed    Spouse name: Not on file   Number of children: 4   Years of education: 34   Highest education level: 11th grade  Occupational History   Occupation: Disability    Employer: DISABLED  Tobacco Use   Smoking status: Never   Smokeless tobacco: Never   Tobacco comments:    spouse, 41 years - husband has quit 01/2011  Vaping Use   Vaping status: Never Used  Substance and Sexual Activity   Alcohol use: No   Drug use: No   Sexual activity: Not Currently  Other Topics Concern   Not on file  Social History Narrative   Lives alone, one level, handicap accessible bathroom, her children all live nearby   Social Drivers of Health   Financial Resource Strain: Low Risk  (01/24/2023)   Overall Financial Resource Strain (CARDIA)    Difficulty of Paying Living Expenses: Not hard at all  Food Insecurity: No Food Insecurity (05/26/2024)   Hunger Vital Sign    Worried About Running Out of Food in the Last Year: Never true    Ran Out of Food in the Last Year: Never true  Transportation Needs: No Transportation Needs (05/26/2024)   PRAPARE - Administrator, Civil Service (Medical): No    Lack of Transportation (Non-Medical): No  Physical Activity: Insufficiently Active (01/24/2023)   Exercise Vital Sign    Days of Exercise per Week: 3 days    Minutes of Exercise per Session: 30 min  Stress: No Stress Concern Present (01/24/2023)   [employer] of Occupational Health - Occupational Stress Questionnaire    Feeling of Stress : Not at all  Social Connections: Moderately Integrated (05/19/2024)   Social Connection and Isolation Panel    Frequency of Communication with Friends and Family: More than three times a week    Frequency of Social  Gatherings with Friends and Family: More than three times a week    Attends Religious Services: More than 4 times per year    Active Member of [employer] or Organizations: Yes    Attends Banker Meetings: More than 4 times per year    Marital Status: Widowed    Family History  Problem Relation Age of Onset   Heart attack Mother 73   Diabetes Mother    Hypertension Mother    Hyperlipidemia Mother    Hyperlipidemia Father    Hypertension Father  Heart attack Father 47   Diabetes Sister    Heart attack Brother 32       x 6   Heart disease Brother    Diabetes Brother    Colon cancer Paternal Aunt        33s, died with brain anuerysm   GER disease Daughter    Cervical cancer Daughter    Diabetes Daughter    Crohn's disease Cousin        paternal    Outpatient Encounter Medications as of 06/18/2024  Medication Sig   empagliflozin  (JARDIANCE ) 10 MG TABS tablet Take 1 tablet (10 mg total) by mouth daily before breakfast.   insulin  aspart (NOVOLOG  FLEXPEN) 100 UNIT/ML FlexPen Inject 5-8 Units into the skin 3 (three) times daily before meals.   acetaminophen  (TYLENOL ) 500 MG tablet Take 1,000 mg by mouth every 6 (six) hours as needed for mild pain (pain score 1-3) or headache.   aspirin  81 MG chewable tablet Chew 1 tablet (81 mg total) by mouth daily.   Blood Glucose Monitoring Suppl DEVI 1 each by Does not apply route in the morning, at noon, and at bedtime. May substitute to any manufacturer covered by patient's insurance.   clopidogrel  (PLAVIX ) 75 MG tablet Take 1 tablet (75 mg total) by mouth daily with breakfast.   Continuous Glucose Sensor (FREESTYLE LIBRE 3 PLUS SENSOR) MISC Change sensor every 15 days. Apply to back of upper arm. DX: E11.65   famotidine  (PEPCID ) 20 MG tablet Take 1 tablet (20 mg total) by mouth 2 (two) times daily.   furosemide  (LASIX ) 20 MG tablet TAKE ONE (1) TABLET BY MOUTH EVERY DAY   Homeopathic Products (MUSCLE CRAMP COMPLEX PO) Take 1 tablet by  mouth as needed (muscle spasms).   insulin  degludec (TRESIBA  FLEXTOUCH) 200 UNIT/ML FlexTouch Pen Inject 30 Units into the skin at bedtime.   isosorbide  mononitrate (IMDUR ) 60 MG 24 hr tablet Take 1 tablet (60 mg total) by mouth daily.   levETIRAcetam  (KEPPRA ) 500 MG tablet Take 1 tablet (500 mg total) by mouth 2 (two) times daily.   linagliptin  (TRADJENTA ) 5 MG TABS tablet Take 1 tablet (5 mg total) by mouth daily.   lisinopril  (ZESTRIL ) 10 MG tablet Take 1 tablet (10 mg total) by mouth daily.   loratadine  (CLARITIN ) 10 MG tablet Take 10 mg by mouth daily as needed for allergies.   metoprolol  tartrate (LOPRESSOR ) 25 MG tablet Take 1 tablet (25 mg total) by mouth 2 (two) times daily.   nitroGLYCERIN  (NITROSTAT ) 0.4 MG SL tablet DISSOLVE 1 TABLET UNDER TONGUE FOR CHESTPAIN.MAY REPEAT EVERY 5 MINUTES FOR 3 DOSES.IF NO RELIEF CALL 911 OR GO TO ER   potassium chloride  (KLOR-CON  M) 10 MEQ tablet Take 2 tablets (20 mEq total) by mouth 2 (two) times daily.   rOPINIRole  (REQUIP ) 1 MG tablet TAKE 1 TABLET BY MOUTH DAILY AS NEEDED   rosuvastatin  (CRESTOR ) 20 MG tablet TAKE ONE (1) TABLET BY MOUTH EVERY DAY   sertraline  (ZOLOFT ) 50 MG tablet Take 1.5 tablets (75 mg total) by mouth daily.   traZODone  (DESYREL ) 50 MG tablet Take 1 tablet (50 mg total) by mouth at bedtime as needed for sleep.   [DISCONTINUED] insulin  aspart (NOVOLOG  FLEXPEN) 100 UNIT/ML FlexPen Inject 0-6 Units into the skin 3 (three) times daily with meals. Inject per sliding scale 0-200 = 0 units, if less than 70 notify provider 201-250 = 2 units 251-300 = 3 units 301-350 = 4 units 351-400 = 5 units 401-450 = 6  units >450 = notify provider   [DISCONTINUED] insulin  degludec (TRESIBA  FLEXTOUCH) 200 UNIT/ML FlexTouch Pen Inject 40 units subcutaneously daily.   Facility-Administered Encounter Medications as of 06/18/2024  Medication   cyanocobalamin  (VITAMIN B12) injection 1,000 mcg   cyanocobalamin  (VITAMIN B12) injection 1,000 mcg     ALLERGIES: Allergies  Allergen Reactions   Iohexol  Anaphylaxis and Nausea Only    Pt had syncopal episode with nausea post IV CM late 1990's,  pt has had prednisone  prep with heart caths x 2 without problem  kdean 04/16/07, Onset Date: 04-15-1996   Codeine Nausea And Vomiting and Palpitations   Metformin  And Related Diarrhea   Ticlid [Ticlopidine Hcl] Nausea And Vomiting   Jardiance  [Empagliflozin ] Other (See Comments)    Recurrent UTIs    VACCINATION STATUS: Immunization History  Administered Date(s) Administered   Fluad Quad(high Dose 65+) 06/17/2019, 07/09/2020, 06/09/2021, 06/20/2022   Fluad Trivalent(High Dose 65+) 06/19/2023   INFLUENZA, HIGH DOSE SEASONAL PF 06/19/2017, 06/18/2018, 06/01/2024   Influenza,inj,Quad PF,6+ Mos 06/28/2015   Influenza-Unspecified 05/25/2014, 06/15/2016   Moderna Sars-Covid-2 Vaccination 11/19/2019, 12/18/2019   Pneumococcal Conjugate-13 02/08/2014   Pneumococcal Polysaccharide-23 06/28/2015   Tdap 11/22/2010, 12/11/2021   Zoster Recombinant(Shingrix ) 12/11/2021, 03/12/2022    Diabetes She presents for her initial diabetic visit. She has type 2 diabetes mellitus. Onset time: She was diagnosed at approximate age of 38 years. Her disease course has been worsening. Hypoglycemia symptoms include confusion, dizziness and sweats. Pertinent negatives for hypoglycemia include no headaches, pallor or seizures. Associated symptoms include fatigue, polydipsia and polyuria. Pertinent negatives for diabetes include no chest pain and no polyphagia. There are no hypoglycemic complications. Symptoms are worsening. Diabetic complications include a CVA, heart disease and nephropathy. Risk factors for coronary artery disease include diabetes mellitus, dyslipidemia, family history, hypertension, post-menopausal and sedentary lifestyle. Current diabetic treatment includes insulin  injections (She is currently on Tresiba  40 units nightly, NovoLog  0-6 units daily,  linagliptin  5 mg daily.). Her weight is fluctuating minimally. She is following a generally unhealthy diet. When asked about meal planning, she reported none. She has not had a previous visit with a dietitian. She participates in exercise intermittently. Her home blood glucose trend is fluctuating dramatically. Her breakfast blood glucose range is generally 140-180 mg/dl. Her lunch blood glucose range is generally >200 mg/dl. Her dinner blood glucose range is generally >200 mg/dl. Her bedtime blood glucose range is generally >200 mg/dl. Her overall blood glucose range is >200 mg/dl. (She is accompanied by her daughter-in-law to [facility]. She brings her freestyle libre device with her.  Downloaded and analyzed showing 33% time in range, 21% Libre 1 hyperglycemia, 43% level 2 hyperglycemia.  She does have 3% hypoglycemia mostly nocturnal.  Her recent A1c was 8.5% on June 08, 2024.  Her average blood glucose is 233 mg/dl for the most recent 14 days. ) An ACE inhibitor/angiotensin II receptor blocker is being taken.  Hyperlipidemia This is a chronic problem. The problem is uncontrolled. Exacerbating diseases include chronic renal disease and diabetes. Pertinent negatives include no chest pain, myalgias or shortness of breath. Current antihyperlipidemic treatment includes statins. Risk factors for coronary artery disease include diabetes mellitus, dyslipidemia, hypertension, a sedentary lifestyle and post-menopausal.  Hypertension This is a chronic problem. The current episode started more than 1 year ago. The problem is controlled. Associated symptoms include sweats. Pertinent negatives include no chest pain, headaches, palpitations or shortness of breath. Risk factors for coronary artery disease include dyslipidemia, diabetes mellitus, family history, post-menopausal state and sedentary lifestyle. Past  treatments include ACE inhibitors. Hypertensive end-organ damage includes kidney disease, CAD/MI and  CVA. Identifiable causes of hypertension include chronic renal disease.     Review of Systems  Constitutional:  Positive for fatigue. Negative for chills, fever and unexpected weight change.  HENT:  Negative for trouble swallowing and voice change.   Eyes:  Negative for visual disturbance.  Respiratory:  Negative for cough, shortness of breath and wheezing.   Cardiovascular:  Negative for chest pain, palpitations and leg swelling.  Gastrointestinal:  Negative for diarrhea, nausea and vomiting.  Endocrine: Positive for polydipsia and polyuria. Negative for cold intolerance, heat intolerance and polyphagia.  Musculoskeletal:  Negative for arthralgias and myalgias.  Skin:  Negative for color change, pallor, rash and wound.  Neurological:  Positive for dizziness. Negative for seizures and headaches.  Psychiatric/Behavioral:  Positive for confusion. Negative for suicidal ideas.     Objective:       06/18/2024    9:42 AM 06/08/2024    1:14 PM 05/22/2024    8:15 AM  Vitals with BMI  Height 5' 6 5' 6   Weight 165 lbs 6 oz 164 lbs   BMI 26.71 26.48   Systolic 132 124 848  Diastolic 70 69 56  Pulse 72 76 70    BP 132/70   Pulse 72   Ht 5' 6 (1.676 m)   Wt 165 lb 6.4 oz (75 kg)   BMI 26.70 kg/m   Wt Readings from Last 3 Encounters:  06/18/24 165 lb 6.4 oz (75 kg)  06/08/24 164 lb (74.4 kg)  05/17/24 160 lb 15 oz (73 kg)     Physical Exam Constitutional:      Appearance: She is well-developed.  HENT:     Head: Normocephalic and atraumatic.  Neck:     Thyroid : No thyromegaly.     Trachea: No tracheal deviation.  Cardiovascular:     Rate and Rhythm: Normal rate and regular rhythm.  Pulmonary:     Effort: Pulmonary effort is normal.     Breath sounds: Normal breath sounds.  Abdominal:     General: Bowel sounds are normal.     Palpations: Abdomen is soft.     Tenderness: There is no abdominal tenderness. There is no guarding.  Musculoskeletal:        General: Normal  range of motion.     Cervical back: Normal range of motion and neck supple.  Skin:    General: Skin is warm and dry.     Coloration: Skin is not pale.     Findings: No erythema or rash.  Neurological:     Mental Status: She is alert and oriented to person, place, and time.     Cranial Nerves: No cranial nerve deficit.     Coordination: Coordination normal.     Deep Tendon Reflexes: Reflexes are normal and symmetric.  Psychiatric:        Judgment: Judgment normal.    CMP ( most recent) CMP     Component Value Date/Time   NA 138 06/11/2024 0945   K 4.4 06/11/2024 0945   CL 101 06/11/2024 0945   CO2 23 06/11/2024 0945   GLUCOSE 269 (H) 06/11/2024 0945   GLUCOSE 192 (H) 05/20/2024 0509   BUN 10 06/11/2024 0945   CREATININE 1.18 (H) 06/11/2024 0945   CREATININE 1.19 (H) 01/05/2014 1714   CALCIUM  9.0 06/11/2024 0945   PROT 6.2 06/11/2024 0945   ALBUMIN  3.6 (L) 06/11/2024 0945   AST 11 06/11/2024  0945   ALT 6 06/11/2024 0945   ALKPHOS 75 06/11/2024 0945   BILITOT 0.3 06/11/2024 0945   EGFR 48 (L) 06/11/2024 0945   GFRNONAA 52 (L) 05/20/2024 0509   GFRNONAA 63 02/05/2013 1002     Diabetic Labs (most recent): Lab Results  Component Value Date   HGBA1C 8.5 (H) 06/08/2024   HGBA1C 8.0 (H) 02/28/2024   HGBA1C 9.1 (H) 11/28/2023   MICROALBUR negative 04/26/2014   MICROALBUR neg 06/11/2013     Lipid Panel ( most recent) Lipid Panel     Component Value Date/Time   CHOL 104 01/14/2024 0832   CHOL 121 05/08/2023 0808   CHOL 107 02/05/2013 1002   TRIG 67 01/14/2024 0832   TRIG 149 11/30/2015 0947   TRIG 143 02/05/2013 1002   HDL 42 01/14/2024 0832   HDL 46 05/08/2023 0808   HDL 35 (L) 11/30/2015 0947   HDL 35 (L) 02/05/2013 1002   CHOLHDL 2.5 01/14/2024 0832   VLDL 13 01/14/2024 0832   LDLCALC 49 01/14/2024 0832   LDLCALC 56 05/08/2023 0808   LDLCALC 195 (H) 04/26/2014 1029   LDLCALC 43 02/05/2013 1002   LDLDIRECT 93 04/06/2015 1038   LABVLDL 19 05/08/2023 0808       TSH was 1.78 on May 06, 2024   Assessment & Plan:   1. Type 2 diabetes mellitus with stage 3a chronic kidney disease, with long-term current use of insulin  (HCC) (Primary)  - [name] has currently uncontrolled symptomatic type 2 DM since  73 years of age. Recent labs reviewed.  She is accompanied by her daughter-in-law to [facility]. She brings her freestyle libre device with her.  Downloaded and analyzed showing 33% time in range, 21% Libre 1 hyperglycemia, 43% level 2 hyperglycemia.  She does have 3% hypoglycemia mostly nocturnal.  Her recent A1c was 8.5% on June 08, 2024.  Her average blood glucose is 233 mg/dl for the most recent 14 days.  - I had a long discussion with her about the possible risk factors and  the pathology behind diabetes and its complications. -her diabetes is complicated by coronary artery disease, CVA, CKD, disequilibrium/sedentary life, and she remains at a high risk for more acute and chronic complications which include CAD, CVA, CKD, retinopathy, and neuropathy. These are all discussed in detail with her.  - I discussed all available options of managing her diabetes including de-escalation of medications. Patient is encouraged to switch to  unprocessed or minimally processed  complex starch, adequate protein intake (mainly plant source), minimal liquid fat, plenty of fruits, and vegetables. -  she is advised to stick to a routine mealtimes to eat 3 complete meals a day and snack only when necessary (to snack only to correct hypoglycemia BG <70 day time or <100 at night).   - she acknowledges that there is a room for improvement in her food and drink choices. - Further Specific Suggestion is made for her to avoid simple carbohydrates  from her diet including Cakes, Sweet Desserts, Ice Cream, Soda (diet and regular), Sweet Tea, Candies, Chips, Cookies, Store Bought Juices, Alcohol ,  Artificial Sweeteners,  Coffee Creamer, and Sugar-free  Products. This will help patient to have more stable blood glucose profile and potentially avoid unintended weight gain.  - she will be scheduled with Penny Crumpton, RDN, CDE for individualized diabetes education.  - I have approached her with the following individualized plan to manage  her diabetes and patient agrees:   - In light  of long duration of diabetes and her current glycemic burden, she will continue to need multiple daily injections of insulin  in order for her to achieve control of diabetes to target. - Accordingly, I advised her to lower her Tresiba  to 30 units nightly to avoid nocturnal hypoglycemia, adjusted her NovoLog  to 5 units 3 times a day with meals  for pre-meal BG readings of 90-150mg /dl, plus patient specific correction dose for unexpected hyperglycemia above 150mg /dl, associated with strict monitoring of glucose 4 times a day-before meals and at bedtime. - she is warned not to take insulin  without proper monitoring per orders. - Adjustment parameters are given to her for hypo and hyperglycemia in writing. - she is encouraged to call clinic for blood glucose levels less than 70 or above 200 mg /dl weekly average. -For renal and cardiovascular benefit, I discussed and initiated Jardiance  10 mg p.o. daily at breakfast.  Side effects and precautions discussed with patient.  She reports intolerance to metformin . -She is also advised to finish her current supply of Tradjenta  5 mg p.o. daily at breakfast until she finishes her current supplies and this medication will be stopped going forward. - By utilizing SGLT2 inhibitors and /or GLP-1 receptor agonists, the goal is to taper off of prandial insulin  for simplicity reasons.   - Specific targets for  A1c;  LDL, HDL,  and Triglycerides were discussed with the patient.  2) Blood Pressure /Hypertension:  her blood pressure is  controlled to target.   she is advised to continue her current medications including lisinopril  10 mg p.o.  daily with breakfast . 3) Lipids/Hyperlipidemia:   Review of her recent lipid panel showed  controlled  LDL at 49 .  she  is advised to continue    Crestor  20 mg daily at bedtime.  Side effects and precautions discussed with her.  4)  Weight/Diet:  Body mass index is 26.7 kg/m.  -     she is not a candidate for major weight loss. I discussed with her the fact that loss of 5 - 10% of her  current body weight will have the most impact on her diabetes management.  The above detailed  ACLM recommendations for nutrition, exercise, sleep, social life, avoidance of risky substances, the need for restorative sleep  information is also detailed on discharge instructions.  5) Chronic Care/Health Maintenance:  -she  is on ACEI/ARB and Statin medications and  is encouraged to initiate and continue to follow up with Ophthalmology, cardiology, nephrology ,dentist,  Podiatrist at least yearly or according to recommendations, and advised to   stay away from smoking. I have recommended yearly flu vaccine and pneumonia vaccine at least every 5 years; moderate intensity exercise for up to 150 minutes weekly; and  sleep for 7- 9 hours a day.  Prior medical records show that that she has osteopenia based on bone density done for her in March 2019.  She will be considered for repeat bone density after her next visit.  - she is  advised to maintain close follow up with [name], FNP for primary care needs, as well as her other providers for optimal and coordinated care.   Thank you for involving me in the care of this pleasant patient.  I spent  63  minutes in the care of the patient today including review of labs from CMP, Lipids, Thyroid  Function, Hematology (current and previous including abstractions from other facilities); face-to-face time discussing  her blood glucose readings/logs, discussing hypoglycemia and  hyperglycemia episodes and symptoms, medications doses, her options of short and long term  treatment based on the latest standards of care / guidelines;  discussion about incorporating lifestyle medicine;  and documenting the encounter. Risk reduction counseling performed per USPSTF guidelines to reduce cardiovascular risk factors.      Please refer to Patient Instructions for Blood Glucose Monitoring and Insulin /Medications Dosing Guide  in media tab for additional information. Please  also refer to  Patient Self Inventory in the Media  tab for reviewed elements of pertinent patient history.  [name] and her daughter-in-law Alan participated in the discussions, expressed understanding, and voiced agreement with the above plans.  All questions were answered to her satisfaction. she is encouraged to contact clinic should she have any questions or concerns prior to her return visit.   Follow up plan: - Return in about 4 weeks (around 07/16/2024) for F/U with Meter/CGM Jonnie Only - no Labs.  Ranny Earl, MD [facility] [address] [place], [place] [address] Phone: [phone]  Fax: [phone]    06/18/2024, 11:53 AM  This note was partially dictated with voice recognition software. Similar sounding words can be transcribed inadequately or may not  be corrected upon review.

## 2024-06-19 ENCOUNTER — Ambulatory Visit

## 2024-06-19 ENCOUNTER — Telehealth: Payer: Self-pay

## 2024-06-19 DIAGNOSIS — I4719 Other supraventricular tachycardia: Secondary | ICD-10-CM | POA: Diagnosis not present

## 2024-06-19 DIAGNOSIS — I25110 Atherosclerotic heart disease of native coronary artery with unstable angina pectoris: Secondary | ICD-10-CM | POA: Diagnosis not present

## 2024-06-19 DIAGNOSIS — D649 Anemia, unspecified: Secondary | ICD-10-CM

## 2024-06-19 DIAGNOSIS — E1165 Type 2 diabetes mellitus with hyperglycemia: Secondary | ICD-10-CM | POA: Diagnosis not present

## 2024-06-19 DIAGNOSIS — E1159 Type 2 diabetes mellitus with other circulatory complications: Secondary | ICD-10-CM | POA: Diagnosis not present

## 2024-06-19 DIAGNOSIS — Z794 Long term (current) use of insulin: Secondary | ICD-10-CM | POA: Diagnosis not present

## 2024-06-19 DIAGNOSIS — E441 Mild protein-calorie malnutrition: Secondary | ICD-10-CM

## 2024-06-19 DIAGNOSIS — F339 Major depressive disorder, recurrent, unspecified: Secondary | ICD-10-CM

## 2024-06-19 DIAGNOSIS — N814 Uterovaginal prolapse, unspecified: Secondary | ICD-10-CM | POA: Diagnosis not present

## 2024-06-19 DIAGNOSIS — E1151 Type 2 diabetes mellitus with diabetic peripheral angiopathy without gangrene: Secondary | ICD-10-CM

## 2024-06-19 DIAGNOSIS — G43909 Migraine, unspecified, not intractable, without status migrainosus: Secondary | ICD-10-CM

## 2024-06-19 DIAGNOSIS — I152 Hypertension secondary to endocrine disorders: Secondary | ICD-10-CM | POA: Diagnosis not present

## 2024-06-19 NOTE — Telephone Encounter (Signed)
 Spoke with Leah from [facility] making her aware that home health orders would need to come from pt's PCP.

## 2024-06-19 NOTE — Telephone Encounter (Signed)
 Leah PT with [facility] called stating upon her visit with pt pt's BG was 331, asymptomatic. States pt was confused about insulin  schedule and medication. Requesting a verbal order for H/H skilled nursing evaluation for diabetes, medication management and nutrition. Contact Medford Mosses at [phone].

## 2024-06-19 NOTE — Telephone Encounter (Signed)
 Called pt, reviewed insulin  instructions with her. Pt voiced understanding when asked if she had any questions.

## 2024-06-19 NOTE — Telephone Encounter (Signed)
 TCB to [facility] - VO given to add skilled nursing

## 2024-06-25 ENCOUNTER — Other Ambulatory Visit: Payer: Self-pay | Admitting: *Deleted

## 2024-06-25 DIAGNOSIS — E119 Type 2 diabetes mellitus without complications: Secondary | ICD-10-CM

## 2024-06-25 NOTE — Patient Outreach (Signed)
 Transition of Care week 5  Visit Note  06/25/2024  Name: [name] MRN: [id number]          DOB: August 07, 1951  Situation: Patient enrolled in [facility] 30-day program. Visit completed with patient by telephone.   Background:     Past Medical History:  Diagnosis Date   Anxiety    CAD (coronary artery disease)    DES to circumflex 02/2007, BMS to LAD and PTCA diagonal 03/2007   Carotid artery plaque    Mild   Cataract    Depression    Diverticulitis, colon    Elevated d-dimer 01/08/2014   Essential hypertension, benign    GERD (gastroesophageal reflux disease)    H/O hiatal hernia    HLD (hyperlipidemia)    IDDM (insulin  dependent diabetes mellitus)    Migraine    used to have them really bad; don't have them anymore (01/07/2014)   MS (multiple sclerosis)    Not confirmed   PAT (paroxysmal atrial tachycardia)    Prolapse of uterus    PVD (peripheral vascular disease)    TIA (transient ischemic attack) 1980's    Assessment: Patient Reported Symptoms: Cognitive Cognitive Status: No symptoms reported, Alert and oriented to person, place, and time, Able to follow simple commands, Normal speech and language skills      Neurological Neurological Review of Symptoms: No symptoms reported Oher Neurological Symptoms/Conditions [RPT]: pt denies any seizure activity, on keppra , follows up with neurology Neurological Management Strategies: Adequate rest, Medication therapy Neurological Self-Management Outcome: 4 (good) Neurological Comment: Reviewed signs /symptoms seizure activity  HEENT HEENT Symptoms Reported: No symptoms reported      Cardiovascular Cardiovascular Symptoms Reported: No symptoms reported Does patient have uncontrolled Hypertension?: No    Respiratory Respiratory Symptoms Reported: No symptoms reported    Endocrine Endocrine Symptoms Reported: No symptoms reported Is patient diabetic?: Yes Is patient checking blood sugars at home?: Yes List most recent blood  sugar readings, include date and time of day: Freestyle Libre 3- pt saw endocrinologist on 06/18/24 and is to follow up end of October, insulin  regimen changed, pt is now taking tresiba  at bedtime, is on sliding scale,  has had one episode of hypoglycemia on 10/1 with reading of 62, pt had has issues with [name]- states  one day I couldnt' get a reading for 10 hours  pt called customer service and they told me I needed to upgrade  pt states it was not helpful, pt agreeable to referral to pharmD for assistance Endocrine Self-Management Outcome: 4 (good) Endocrine Comment: Reviewed signs/ symptoms hypoglycemia and treatment, importance of staying in touch with provider and reporting CBG readings that are consistently high or low, pt verbalizes understanding and states she was instructed to call endocrinologist for any concerns or to report readings, pt states she and her daughter keep in touch with endocrinologist as needed  Gastrointestinal Gastrointestinal Symptoms Reported: No symptoms reported      Genitourinary Genitourinary Symptoms Reported: No symptoms reported    Integumentary Integumentary Symptoms Reported: No symptoms reported    Musculoskeletal Musculoskelatal Symptoms Reviewed: Difficulty walking Additional Musculoskeletal Details: walker Musculoskeletal Management Strategies: Medical device, Adequate rest Musculoskeletal Self-Management Outcome: 4 (good) Musculoskeletal Comment: reviewed safety precautions      Psychosocial Psychosocial Symptoms Reported: No symptoms reported         There were no vitals filed for this visit.  Medications Reviewed Today     Reviewed by Aura Mliss LABOR, RN (Registered Nurse) on 06/25/24 at [phone]  Med List Status: <None>   Medication Order Taking? Sig Documenting Provider Last Dose Status Informant  acetaminophen  (TYLENOL ) 500 MG tablet [id number]  Take 1,000 mg by mouth every 6 (six) hours as needed for mild pain (pain score 1-3) or headache.  [provider]  Active Self, Pharmacy Records  aspirin  81 MG chewable tablet [id number]  Chew 1 tablet (81 mg total) by mouth daily. Davia Nydia POUR, MD  Active Self, Pharmacy Records  Blood Glucose Monitoring Suppl DEVI [id number]  1 each by Does not apply route in the morning, at noon, and at bedtime. May substitute to any manufacturer covered by patient's insurance. [name], FNP  Active Self, Pharmacy Records  clopidogrel  (PLAVIX ) 75 MG tablet [id number]  Take 1 tablet (75 mg total) by mouth daily with breakfast. Pietro Redell RAMAN, MD  Active Self, Pharmacy Records  Continuous Glucose Sensor (FREESTYLE LIBRE 3 PLUS SENSOR) [place] [id number]  Change sensor every 15 days. Apply to back of upper arm. DX: E11.65 [name], FNP  Active Self, Pharmacy Records  cyanocobalamin  (VITAMIN B12) injection 1,000 mcg [id number]   [name], FNP  Active   cyanocobalamin  (VITAMIN B12) injection 1,000 mcg [id number]   [name], FNP  Active   empagliflozin  (JARDIANCE ) 10 MG TABS tablet [id number]  Take 1 tablet (10 mg total) by mouth daily before breakfast. Nida, Gebreselassie W, MD  Active   famotidine  (PEPCID ) 20 MG tablet [id number]  Take 1 tablet (20 mg total) by mouth 2 (two) times daily. [name], FNP  Active Self, Pharmacy Records  furosemide  (LASIX ) 20 MG tablet [id number]  TAKE ONE (1) TABLET BY MOUTH EVERY DAY [name], FNP  Active Self, Pharmacy Records  Homeopathic Products (MUSCLE CRAMP COMPLEX PO) [id number]  Take 1 tablet by mouth as needed (muscle spasms). [provider]  Active Self, Pharmacy Records  insulin  aspart (NOVOLOG  FLEXPEN) 100 UNIT/ML FlexPen [id number] Yes Inject 5-8 Units into the skin 3 (three) times daily before meals. [provider]  Active   insulin  degludec (TRESIBA  FLEXTOUCH) 200 UNIT/ML FlexTouch Pen [id number] Yes Inject 30 Units into the skin at bedtime. Nida, Gebreselassie W, MD  Active   isosorbide  mononitrate  (IMDUR ) 60 MG 24 hr tablet [id number]  Take 1 tablet (60 mg total) by mouth daily. Lavona Agent, MD  Active Self, Pharmacy Records  levETIRAcetam  (KEPPRA ) 500 MG tablet [id number]  Take 1 tablet (500 mg total) by mouth 2 (two) times daily. [name], FNP  Active   linagliptin  (TRADJENTA ) 5 MG TABS tablet [id number]  Take 1 tablet (5 mg total) by mouth daily. Raenelle Donalda HERO, MD  Active   lisinopril  (ZESTRIL ) 10 MG tablet [id number]  Take 1 tablet (10 mg total) by mouth daily. Lavona Agent, MD  Active Self, Pharmacy Records  loratadine  (CLARITIN ) 10 MG tablet [id number]  Take 10 mg by mouth daily as needed for allergies. [provider]  Active Self, Pharmacy Records  metoprolol  tartrate (LOPRESSOR ) 25 MG tablet [id number]  Take 1 tablet (25 mg total) by mouth 2 (two) times daily. [name], FNP  Active Self, Pharmacy Records  nitroGLYCERIN  (NITROSTAT ) 0.4 MG SL tablet [id number]  DISSOLVE 1 TABLET UNDER TONGUE FOR CHESTPAIN.MAY REPEAT EVERY 5 MINUTES FOR 3 DOSES.IF NO RELIEF CALL 911 OR GO TO ER [name], FNP  Active Self, Pharmacy Records  potassium chloride  (KLOR-CON  M) 10 MEQ tablet [id number]  Take 2 tablets (20 mEq total) by mouth 2 (two) times daily.  [name], FNP  Active Self, Pharmacy Records  rOPINIRole  (REQUIP ) 1 MG tablet [id number]  TAKE 1 TABLET BY MOUTH DAILY AS NEEDED [name], FNP  Active Self, Pharmacy Records  rosuvastatin  (CRESTOR ) 20 MG tablet [id number]  TAKE ONE (1) TABLET BY MOUTH EVERY DAY [name], FNP  Active Self, Pharmacy Records  sertraline  (ZOLOFT ) 50 MG tablet [id number]  Take 1.5 tablets (75 mg total) by mouth daily. [name], FNP  Active Self, Pharmacy Records  traZODone  (DESYREL ) 50 MG tablet [id number]  Take 1 tablet (50 mg total) by mouth at bedtime as needed for sleep. [name], FNP  Active Self, Pharmacy Records            Goals Addressed             This Visit's Progress    VBCI  Transitions of Care (TOC) Care Plan       Problems:  Recent Hospitalization for treatment of Seizures 06/02/24 pt reports she has appointment with [facility] on 06/30/24, pt fell this past Saturday, this fall was reported to primary care provider by home health, pt states she was not injured, pt has walker, pt denies any seizure activity 06/09/24- pt saw primary care provider on 06/08/24, discussed chronic diarrhea, will be providing stool specimen, discussed recent episodes of hypoglycemia and diabetic medications reviewed, pt states she will be working with pharmacist, pt states  I eat and drink and blood sugar comes right back up  - hypoglycemia usually happens at night with ranges 59-69, pt denies any seizure activity, is on keppra  06/17/24- spoke with pt who reports diarrhea is resolved, continues to have hypoglycemia 2-3 x per week, 50-60's range, eating and drinking corrects, CGM alarms and wakes pt, pt denies any seizure activity 06/25/24- pt saw endocrinologist on 06/18/24- reports medication changes made, pt is on sliding scale and is taking tresiba  now before bed instead of before lunch,  pt states she has had one hypoglycemic episode on 10/1 with reading of 62, pt states she calls endocrinologist to report any low readings, states home health nurse also calls, states home health nurse saw her today, pt states her average for past 7 days is 129, pt reports she has had issues with Freestyle Libre 3 not working correctly, states  one day I couldn't get a reading for 10 hours  pt reports she called customer service and was told she needed an upgrade, pt states she had difficulty understanding due to language/ heavy accent and  it was not helpful at all , pt agreeable to referral with pharmD for assistance, pt declines referral to longitudinal case manager, pt denies any seizure activity  Goal:  Over the next 30 days, the patient will not experience hospital  readmission  Interventions:  Falls Interventions: Reviewed medications and discussed potential side effects of medications such as dizziness and frequent urination Advised patient of importance of notifying provider of falls Assessed for signs and symptoms of orthostatic hypotension Assessed for falls since last encounter Reviewed safety precautions, importance of using walker  Seizures Interventions: Evaluation of current treatment plan related to seizures, self-management and patient's adherence to plan as established by provider. Discussed plans with patient for ongoing care management follow up and provided patient with direct contact information for care management team Provided education to patient re: signs/ symptoms seizure activity, importance of reporting to provider Reviewed medications with patient and discussed importance of taking as prescribed Reviewed scheduled/upcoming provider  appointments including  10/7 neurologist  Advised patient, providing education and rationale, to check cbg as prescribed/ per CGM and record, calling provider for findings outside established parameters  Reinforced signs /symptoms of hypoglycemia, hyperglycemia and interventions Reviewed plan of care with pt including TOC case closure and referral to pharmD for assistance with Freestyle Libre 3- order placed  Patient Self Care Activities:  Attend all scheduled provider appointments Attend church or other social activities Call pharmacy for medication refills 3-7 days in advance of running out of medications Call provider office for new concerns or questions  Notify RN Care Manager of TOC call rescheduling needs Participate in Transition of Care Program/Attend TOC scheduled calls Perform all self care activities independently  Take medications as prescribed   Report any seizure activity or falls to your provider Follow up with Dr. Lenis in October Pharmacist will call you for assistance with  [facility] Fall prevention strategies: change positions slowly, use a walker or cane (per provider recommendations) when walking, keep walkways clear, have good lighting in room. It is important to contact your provider if you have any falls. Maintain muscle strength/tone by exercise per provider recommendations.  Follow RULE OF 15 for low blood sugar management:  How to treat low blood sugars (Blood sugar less than 70 mg/dl  Please follow the RULE OF 15 for the treatment of hypoglycemia treatment (When your blood sugars are less than 70 mg/ dl) STEP  1:  Take 15 grams of carbohydrates when your blood sugar is low, which includes One tube of glucose gel STEP 2:  Recheck blood sugar in 15 minutes STEP 3:  3-4 glucose tabs or  3-4 oz of juice or regular soda or If your blood sugar is still low at the 15 minute recheck ---then, go back to STEP 1 and treat again with another 15 grams of carbohydrates  Plan:  No further follow up required: TOC case closure The patient has been provided with contact information for the care management team and has been advised to call with any health related questions or concerns.          Recommendation:   PCP Follow-up Specialty provider follow-up endocrinology Continue working with home health  Follow Up Plan:   Closing From:  Transitions of Care Program  Mliss Creed [facility], BSN RN Care Manager/ Transition of Care [place]/ [facility] [phone]

## 2024-06-25 NOTE — Patient Instructions (Signed)
 Visit Information  Thank you for taking time to visit with me today. Please don't hesitate to contact me if I can be of assistance to you before our next scheduled telephone appointment.  Our next appointment is no further scheduled appointments.    Following is a copy of your care plan:   Goals Addressed             This Visit's Progress    VBCI Transitions of Care (TOC) Care Plan       Problems:  Recent Hospitalization for treatment of Seizures 06/02/24 pt reports she has appointment with [facility] on 06/30/24, pt fell this past Saturday, this fall was reported to primary care provider by home health, pt states she was not injured, pt has walker, pt denies any seizure activity 06/09/24- pt saw primary care provider on 06/08/24, discussed chronic diarrhea, will be providing stool specimen, discussed recent episodes of hypoglycemia and diabetic medications reviewed, pt states she will be working with pharmacist, pt states  I eat and drink and blood sugar comes right back up  - hypoglycemia usually happens at night with ranges 59-69, pt denies any seizure activity, is on keppra  06/17/24- spoke with pt who reports diarrhea is resolved, continues to have hypoglycemia 2-3 x per week, 50-60's range, eating and drinking corrects, CGM alarms and wakes pt, pt denies any seizure activity 06/25/24- pt saw endocrinologist on 06/18/24- reports medication changes made, pt is on sliding scale and is taking tresiba  now before bed instead of before lunch,  pt states she has had one hypoglycemic episode on 10/1 with reading of 62, pt states she calls endocrinologist to report any low readings, states home health nurse also calls, states home health nurse saw her today, pt states her average for past 7 days is 129, pt reports she has had issues with Freestyle Libre 3 not working correctly, states  one day I couldn't get a reading for 10 hours  pt reports she called customer service and  was told she needed an upgrade, pt states she had difficulty understanding due to language/ heavy accent and  it was not helpful at all , pt agreeable to referral with pharmD for assistance, pt declines referral to longitudinal case manager, pt denies any seizure activity  Goal:  Over the next 30 days, the patient will not experience hospital readmission  Interventions:  Falls Interventions: Reviewed medications and discussed potential side effects of medications such as dizziness and frequent urination Advised patient of importance of notifying provider of falls Assessed for signs and symptoms of orthostatic hypotension Assessed for falls since last encounter Reviewed safety precautions, importance of using walker  Seizures Interventions: Evaluation of current treatment plan related to seizures, self-management and patient's adherence to plan as established by provider. Discussed plans with patient for ongoing care management follow up and provided patient with direct contact information for care management team Provided education to patient re: signs/ symptoms seizure activity, importance of reporting to provider Reviewed medications with patient and discussed importance of taking as prescribed Reviewed scheduled/upcoming provider appointments including  10/7 neurologist  Advised patient, providing education and rationale, to check cbg as prescribed/ per CGM and record, calling provider for findings outside established parameters  Reinforced signs /symptoms of hypoglycemia, hyperglycemia and interventions Reviewed plan of care with pt including TOC case closure and referral to pharmD for assistance with Freestyle Libre 3- order placed  Patient Self Care Activities:  Attend all scheduled provider appointments Attend church or  other social activities Call pharmacy for medication refills 3-7 days in advance of running out of medications Call provider office for new concerns or questions   Notify RN Care Manager of TOC call rescheduling needs Participate in Transition of Care Program/Attend TOC scheduled calls Perform all self care activities independently  Take medications as prescribed   Report any seizure activity or falls to your provider Follow up with Dr. Lenis in October Pharmacist will call you for assistance with [facility] Fall prevention strategies: change positions slowly, use a walker or cane (per provider recommendations) when walking, keep walkways clear, have good lighting in room. It is important to contact your provider if you have any falls. Maintain muscle strength/tone by exercise per provider recommendations.  Follow RULE OF 15 for low blood sugar management:  How to treat low blood sugars (Blood sugar less than 70 mg/dl  Please follow the RULE OF 15 for the treatment of hypoglycemia treatment (When your blood sugars are less than 70 mg/ dl) STEP  1:  Take 15 grams of carbohydrates when your blood sugar is low, which includes One tube of glucose gel STEP 2:  Recheck blood sugar in 15 minutes STEP 3:  3-4 glucose tabs or  3-4 oz of juice or regular soda or If your blood sugar is still low at the 15 minute recheck ---then, go back to STEP 1 and treat again with another 15 grams of carbohydrates  Plan:  No further follow up required: TOC case closure The patient has been provided with contact information for the care management team and has been advised to call with any health related questions or concerns.         Patient verbalizes understanding of instructions and care plan provided today and agrees to view in MyChart. Active MyChart status and patient understanding of how to access instructions and care plan via MyChart confirmed with patient.     No further follow up required: case closure  Please call the care guide team at [phone] if you need to cancel or reschedule your appointment.   Please call the Suicide and Crisis Lifeline:  988 call the [place]  National Suicide Prevention Lifeline: 281-173-3788 or TTY: 339-046-4348 TTY 9142564862) to talk to a trained counselor call 1-800-273-TALK (toll free, 24 hour hotline) go to [facility] [address] [place] [phone]) call the [facility] Crisis Line: 216-266-7691 call 911 if you are experiencing a Mental Health or Behavioral Health Crisis or need someone to talk to.  Mliss Creed [facility], BSN RN Care Manager/ Transition of Care [place]/ [facility] [phone]

## 2024-06-26 ENCOUNTER — Other Ambulatory Visit: Payer: Self-pay | Admitting: Family Medicine

## 2024-06-29 ENCOUNTER — Telehealth: Payer: Self-pay | Admitting: Pharmacist

## 2024-06-29 NOTE — Telephone Encounter (Addendum)
 06/29/2024 Name: [name] MRN: [id number] DOB: [birth year]  Chief Complaint  Patient presents with   Medication Management    libre    [name] is a 73 y.o. year old female who presented for a telephone visit.  I connected with  [name] on 06/29/24 by telephone and verified that I am speaking with the correct person using two identifiers. I discussed the limitations of evaluation and management by telemedicine. The patient expressed understanding and agreed to proceed.  Patient was located in her home and PharmD in PCP office during this visit   They were referred to the pharmacist by their PCP for assistance in managing diabetes and medication access.    Subjective:  Referral from PCP due to patient with recent seizures and uncontrolled diabetes.  Patient has also been experiencing diarrhea and fatigue.  She now sees [facility] and has her initial appt on 06/18/24  She does not have as much energy as she used to.   She has her medications at home (some via patient assistance), but doesn't like taking so much medicine.  She reports libre CGM readings of 200-300.  She will have the occasional low blood sugar (around 69) over night (potential compression low).  She is still enrolled in the novo nordisk patient assistance program (Tresiba /Novolog /Ozempic ), however that will end 08/2024.  She reports her libre 3 PLUS CGM is not working properly.  She states her last two sensors have not worked.   Care Team: Primary Care Provider: [name], FNP 06/04/24 Endocrinologist-Dr. Lenis 07/19/24  Medication Access/Adherence  Current Pharmacy:  [facility] [place], [place] - [address] [place] [address] Phone: [phone] Fax: [phone]  Patient reports affordability concerns with their medications: Yes  Patient reports access/transportation concerns to their pharmacy: No  Patient reports adherence concerns with their  medications:  No    Diabetes:  Current medications: Lantus  25-30 units daily; Novolog  0-6 units 3 times daily (often skips doses); Ozempic  0.5mg  weekly--seems to be tolerating okay Medications tried in the past: stopped Ozempic  due to dull stomach pains. Jardiance , metformin    Now Using FSL3 PLUS CGM (uses reader device;mobile not compatible)   Past glucose readings (sensors not working currently):  Not able to read out all of the CGM details Date of Download: 04/16/24 % Time CGM is active: n/a% Average Glucose: 203 mg/dL Glucose Management Indicator: n/a  Time in Goal:  - Time in range 70-180: 43% - Time above range: 56% - Time below range: 1% (asymptomatic, potential compression lows) Observed patterns: post prandial and AM hyperglycemia   Current meal patterns:  Reports she only eats 1-2 meals daily Encouraged 2-3 healthy plate meals daily to keep blood sugar stable   Current physical activity: limited    Current medication access support: novo nordisk PAP (Tresiba /Novolog /Ozempic )   Objective:  Lab Results  Component Value Date   HGBA1C 8.5 (H) 06/08/2024    Lab Results  Component Value Date   CREATININE 1.18 (H) 06/11/2024   BUN 10 06/11/2024   NA 138 06/11/2024   K 4.4 06/11/2024   CL 101 06/11/2024   CO2 23 06/11/2024    Lab Results  Component Value Date   CHOL 104 01/14/2024   HDL 42 01/14/2024   LDLCALC 49 01/14/2024   LDLDIRECT 93 04/06/2015   TRIG 67 01/14/2024   CHOLHDL 2.5 01/14/2024    Medications Reviewed Today     Reviewed by Billee,  Terryl Molinelli D, [facility] (Pharmacist) on 07/07/24 at 1532  Med List Status: <None>   Medication Order Taking? Sig Documenting Provider Last Dose Status Informant  acetaminophen  (TYLENOL ) 500 MG tablet [id number]  Take 1,000 mg by mouth every 6 (six) hours as needed for mild pain (pain score 1-3) or headache. [provider]  Active Self, Pharmacy Records  aspirin  81 MG chewable tablet [id number]  Chew 1 tablet (81  mg total) by mouth daily. Davia Nydia POUR, MD  Active Self, Pharmacy Records  Blood Glucose Monitoring Suppl DEVI [id number]  1 each by Does not apply route in the morning, at noon, and at bedtime. May substitute to any manufacturer covered by patient's insurance. [name], FNP  Active Self, Pharmacy Records  clopidogrel  (PLAVIX ) 75 MG tablet [id number]  Take 1 tablet (75 mg total) by mouth daily with breakfast. Pietro Redell RAMAN, MD  Active Self, Pharmacy Records  Continuous Glucose Sensor (FREESTYLE LIBRE 3 PLUS SENSOR) [place] [id number]  Change sensor every 15 days. Apply to back of upper arm. DX: E11.65 [name], FNP  Active Self, Pharmacy Records  cyanocobalamin  (VITAMIN B12) injection 1,000 mcg [id number]   [name], FNP  Active   cyanocobalamin  (VITAMIN B12) injection 1,000 mcg [id number]   [name], FNP  Active   empagliflozin  (JARDIANCE ) 10 MG TABS tablet [id number]  Take 1 tablet (10 mg total) by mouth daily before breakfast. Nida, Gebreselassie W, MD  Active   famotidine  (PEPCID ) 20 MG tablet [id number]  Take 1 tablet (20 mg total) by mouth 2 (two) times daily. [name], FNP  Active Self, Pharmacy Records  furosemide  (LASIX ) 20 MG tablet [id number]  TAKE ONE (1) TABLET BY MOUTH EVERY DAY [name], FNP  Active Self, Pharmacy Records  Homeopathic Products (MUSCLE CRAMP COMPLEX PO) [id number]  Take 1 tablet by mouth as needed (muscle spasms). [provider]  Active Self, Pharmacy Records  insulin  aspart (NOVOLOG  FLEXPEN) 100 UNIT/ML FlexPen [id number]  Inject 5-8 Units into the skin 3 (three) times daily before meals. [provider]  Active   insulin  degludec (TRESIBA  FLEXTOUCH) 200 UNIT/ML FlexTouch Pen [id number]  Inject 30 Units into the skin at bedtime. Nida, Gebreselassie W, MD  Active   isosorbide  mononitrate (IMDUR ) 60 MG 24 hr tablet [id number]  Take 1 tablet (60 mg total) by mouth daily. Lavona Agent, MD  Active Self,  Pharmacy Records  levETIRAcetam  (KEPPRA ) 500 MG tablet [id number]  Take 1 tablet (500 mg total) by mouth 2 (two) times daily. Camara, Amadou, MD  Active   levocetirizine (XYZAL ) 5 MG tablet [id number]  TAKE 1/2 TABLET BY MOUTH EVERY EVENING [name], FNP  Active   linagliptin  (TRADJENTA ) 5 MG TABS tablet [id number]  Take 1 tablet (5 mg total) by mouth daily. Raenelle Donalda HERO, MD  Active   lisinopril  (ZESTRIL ) 10 MG tablet [id number]  Take 1 tablet (10 mg total) by mouth daily. Lavona Agent, MD  Active Self, Pharmacy Records  loratadine  (CLARITIN ) 10 MG tablet [id number]  Take 10 mg by mouth daily as needed for allergies. [provider]  Active Self, Pharmacy Records  metoprolol  tartrate (LOPRESSOR ) 25 MG tablet [id number]  Take 1 tablet (25 mg total) by mouth 2 (two) times daily. [name], FNP  Active Self, Pharmacy Records  nitroGLYCERIN  (NITROSTAT ) 0.4 MG SL tablet [id number]  DISSOLVE 1 TABLET UNDER TONGUE FOR CHESTPAIN.MAY REPEAT EVERY 5 MINUTES FOR 3 DOSES.IF NO RELIEF CALL 911 OR GO TO ER Joesph,  [name], FNP  Active Self, Pharmacy Records  potassium chloride  (KLOR-CON  M) 10 MEQ tablet [id number]  Take 2 tablets (20 mEq total) by mouth 2 (two) times daily. [name], FNP  Active Self, Pharmacy Records  rOPINIRole  (REQUIP ) 1 MG tablet [id number]  TAKE 1 TABLET BY MOUTH DAILY AS NEEDED [name], FNP  Active Self, Pharmacy Records  rosuvastatin  (CRESTOR ) 20 MG tablet [id number]  TAKE ONE (1) TABLET BY MOUTH EVERY DAY [name], FNP  Active Self, Pharmacy Records  sertraline  (ZOLOFT ) 50 MG tablet [id number]  Take 1.5 tablets (75 mg total) by mouth daily. [name], FNP  Active Self, Pharmacy Records  traZODone  (DESYREL ) 50 MG tablet [id number]  Take 1 tablet (50 mg total) by mouth at bedtime as needed for sleep. [name], FNP  Active Self, Pharmacy Records            Assessment/Plan:   Diabetes: - Currently  uncontrolled--patient does not want to be on multiple medications/injections - Reviewed long term cardiovascular and renal outcomes of uncontrolled blood sugar - Reviewed goal A1c, goal fasting, and goal 2 hour post prandial glucose - Reviewed dietary modifications including FOLLOWING A HEART HEALTHY DIET/HEALTHY PLATE METHOD - Reviewed lifestyle modifications including:  - Recommend to : Hospitalist changed Ozempic  to tradjenta  Continue Jardiance  and Tradjenta  CONTINUE Tresiba  30 units nightly CONTINUE Novolog  0-6 units daily (6 units 151-200, 7 units 200-300, 8 units 300-400) CONTINUE all other medications as prescribed Encouraged compliance of all medications (often skips meal time insulin  due to fear of hypoglycemia, doesn't want to take medicine)--will f/u with Endocrine on 07/16/24; offered to have full medication review at PCP office, however patient is unable to get a ride for appt. - Patient denies personal or family history of multiple endocrine neoplasia type 2, medullary thyroid  cancer; personal history of pancreatitis or gallbladder disease. - Recommend to check glucose using libre FSL3-->3+ - Enrolled in novo nordisk PAP for Tresiba  and Novolog  -troubleshooting provided for Libre 3 Plus--will provide additional sensors, may have to get new reader if new sensors don't program; error message is Sensor error sensor not connected   Follow Up Plan: neuro 06/30/24  Mliss Tarry Griffin, PharmD, BCACP, CPP Clinical Pharmacist, [facility]

## 2024-06-30 ENCOUNTER — Encounter: Payer: Self-pay | Admitting: Neurology

## 2024-06-30 ENCOUNTER — Ambulatory Visit: Admitting: Neurology

## 2024-06-30 VITALS — BP 139/78 | HR 65 | Resp 17 | Ht 66.0 in | Wt 162.0 lb

## 2024-06-30 DIAGNOSIS — Z5181 Encounter for therapeutic drug level monitoring: Secondary | ICD-10-CM | POA: Diagnosis not present

## 2024-06-30 DIAGNOSIS — R569 Unspecified convulsions: Secondary | ICD-10-CM

## 2024-06-30 MED ORDER — LEVETIRACETAM 500 MG PO TABS: 500.0000 mg | ORAL_TABLET | Freq: Two times a day (BID) | ORAL | 3 refills | Status: AC

## 2024-06-30 NOTE — Patient Instructions (Signed)
 Continue with levetiracetam  500 mg twice daily Will check a Levetiracetam  level today Continue your other medications Follow-up in 1 year or sooner if worse Please contact me if you do have another event

## 2024-06-30 NOTE — Progress Notes (Signed)
 [facility]  PATIENT: [name] DOB: [birth year]  REQUESTING CLINICIAN: Matthews Elida HERO, MD HISTORY FROM: Patient and chart review  REASON FOR VISIT: Seizure    HISTORICAL  CHIEF COMPLAINT:  Chief Complaint  Patient presents with   New Patient (Initial Visit)    Rm12, alone, internal hospital referral for seizure like activity, encephalopathy:pt stated that last sz occurred 3 weeks ago. Wasn't on sz medication prior. New medication is helping     HISTORY OF PRESENT ILLNESS:  Discussed the use of AI scribe software for clinical note transcription with the patient, who gave verbal consent to proceed.  [name] is a 73 year old female with a history of hypertension, hyperlipidemia, diabetes, depression, insomnia and seizures who presents for follow-up after starting seizure medication.  She experiences episodes characterized by a sensation of throat constriction, leading to difficulty breathing and speaking in an unintelligible language. These episodes have been occurring occasionally over the past nine years, with the most recent and longest episode lasting about fifteen minutes in August 2025, which necessitated a hospital visit. She feels normal immediately after these episodes, without any residual tiredness or need for rest. In the hospital, her work up including MRI Brain, CT head and EEG did not show any acute abnormalities.  Following the hospital visit in August 2025, she was started on Keppra , which she takes twice daily. She reports no side effects from the medication and has not experienced any further episodes since starting it.  In the past, she had a similar episode approximately nine years ago while on the phone with her sister, which required her husband to intervene and call for an ambulance. Despite these episodes, she has not sustained any injuries.  Her family history includes a niece who previously experienced epileptic seizures but has  not had any in recent years.  [place] and receives regular visits from a physical therapist and nurses twice a week.  No recent falls or injuries. No recent episodes since starting medication.    Handedness: Right handed   Onset: 9 years   Seizure Type: Throat feels like closing up, can't breath, then start talking gibberish, can last up to 15 minutes  Current frequency: Last episode in August   Any injuries from seizures: Denies   Seizure risk factors: Niece   Previous ASMs: None   Currenty ASMs: Levetiracetam  500 mg twice daily   ASMs side effects: Denies   Brain Images: No acute abnormalities  Previous EEGs: Normal    OTHER MEDICAL CONDITIONS: Hypertension, hyperlipidemia, diabetes mellitus, insomnia, depression  REVIEW OF SYSTEMS: Full 14 system review of systems performed and negative with exception of: As noted in the HPI   ALLERGIES: Allergies  Allergen Reactions   Iohexol  Anaphylaxis and Nausea Only    Pt had syncopal episode with nausea post IV CM late 1990's,  pt has had prednisone  prep with heart caths x 2 without problem  kdean 04/16/07, Onset Date: 04-15-1996   Codeine Nausea And Vomiting and Palpitations   Metformin  And Related Diarrhea   Ticlid [Ticlopidine Hcl] Nausea And Vomiting   Jardiance  [Empagliflozin ] Other (See Comments)    Recurrent UTIs    HOME MEDICATIONS: Outpatient Medications Prior to Visit  Medication Sig Dispense Refill   acetaminophen  (TYLENOL ) 500 MG tablet Take 1,000 mg by mouth every 6 (six) hours as needed for mild pain (pain score 1-3) or headache.     aspirin  81 MG chewable tablet Chew 1  tablet (81 mg total) by mouth daily. 90 tablet 3   Blood Glucose Monitoring Suppl DEVI 1 each by Does not apply route in the morning, at noon, and at bedtime. May substitute to any manufacturer covered by patient's insurance. 1 each 0   clopidogrel  (PLAVIX ) 75 MG tablet Take 1 tablet (75 mg total) by mouth daily with  breakfast. 90 tablet 3   Continuous Glucose Sensor (FREESTYLE LIBRE 3 PLUS SENSOR) MISC Change sensor every 15 days. Apply to back of upper arm. DX: E11.65 2 each 5   empagliflozin  (JARDIANCE ) 10 MG TABS tablet Take 1 tablet (10 mg total) by mouth daily before breakfast. 90 tablet 1   famotidine  (PEPCID ) 20 MG tablet Take 1 tablet (20 mg total) by mouth 2 (two) times daily. 180 tablet 3   furosemide  (LASIX ) 20 MG tablet TAKE ONE (1) TABLET BY MOUTH EVERY DAY 90 tablet 0   Homeopathic Products (MUSCLE CRAMP COMPLEX PO) Take 1 tablet by mouth as needed (muscle spasms).     insulin  aspart (NOVOLOG  FLEXPEN) 100 UNIT/ML FlexPen Inject 5-8 Units into the skin 3 (three) times daily before meals.     insulin  degludec (TRESIBA  FLEXTOUCH) 200 UNIT/ML FlexTouch Pen Inject 30 Units into the skin at bedtime. 9 mL 1   isosorbide  mononitrate (IMDUR ) 60 MG 24 hr tablet Take 1 tablet (60 mg total) by mouth daily. 30 tablet 6   levocetirizine (XYZAL ) 5 MG tablet TAKE 1/2 TABLET BY MOUTH EVERY EVENING 90 tablet 3   linagliptin  (TRADJENTA ) 5 MG TABS tablet Take 1 tablet (5 mg total) by mouth daily. 30 tablet 2   lisinopril  (ZESTRIL ) 10 MG tablet Take 1 tablet (10 mg total) by mouth daily. 90 tablet 3   loratadine  (CLARITIN ) 10 MG tablet Take 10 mg by mouth daily as needed for allergies.     metoprolol  tartrate (LOPRESSOR ) 25 MG tablet Take 1 tablet (25 mg total) by mouth 2 (two) times daily. 60 tablet 3   nitroGLYCERIN  (NITROSTAT ) 0.4 MG SL tablet DISSOLVE 1 TABLET UNDER TONGUE FOR CHESTPAIN.MAY REPEAT EVERY 5 MINUTES FOR 3 DOSES.IF NO RELIEF CALL 911 OR GO TO ER 25 tablet 3   potassium chloride  (KLOR-CON  M) 10 MEQ tablet Take 2 tablets (20 mEq total) by mouth 2 (two) times daily. 360 tablet 3   rOPINIRole  (REQUIP ) 1 MG tablet TAKE 1 TABLET BY MOUTH DAILY AS NEEDED 30 tablet 0   rosuvastatin  (CRESTOR ) 20 MG tablet TAKE ONE (1) TABLET BY MOUTH EVERY DAY 90 tablet 1   sertraline  (ZOLOFT ) 50 MG tablet Take 1.5 tablets  (75 mg total) by mouth daily. 145 tablet 3   traZODone  (DESYREL ) 50 MG tablet Take 1 tablet (50 mg total) by mouth at bedtime as needed for sleep. 90 tablet 3   levETIRAcetam  (KEPPRA ) 500 MG tablet Take 1 tablet (500 mg total) by mouth 2 (two) times daily. 60 tablet 1   Facility-Administered Medications Prior to Visit  Medication Dose Route Frequency Provider Last Rate Last Admin   cyanocobalamin  (VITAMIN B12) injection 1,000 mcg  1,000 mcg Intramuscular Weekly [name], FNP   1,000 mcg at 02/25/24 0907   cyanocobalamin  (VITAMIN B12) injection 1,000 mcg  1,000 mcg Intramuscular Q30 days [name], FNP   1,000 mcg at 06/01/24 1032    PAST MEDICAL HISTORY: Past Medical History:  Diagnosis Date   Anxiety    CAD (coronary artery disease)    DES to circumflex 02/2007, BMS to LAD and PTCA diagonal 03/2007  Carotid artery plaque    Mild   Cataract    Depression    Diverticulitis, colon    Elevated d-dimer 01/08/2014   Essential hypertension, benign    GERD (gastroesophageal reflux disease)    H/O hiatal hernia    HLD (hyperlipidemia)    IDDM (insulin  dependent diabetes mellitus)    Migraine    used to have them really bad; don't have them anymore (01/07/2014)   MS (multiple sclerosis)    Not confirmed   PAT (paroxysmal atrial tachycardia)    Prolapse of uterus    PVD (peripheral vascular disease)    TIA (transient ischemic attack) 1980's    PAST SURGICAL HISTORY: Past Surgical History:  Procedure Laterality Date   ABDOMINAL HYSTERECTOMY  1986   ovaries remain - prolaspe uterus    APPENDECTOMY  ~ 1970   BREAST BIOPSY Right 1980's   BREAST LUMPECTOMY Right 1980's   Dr. MYRTIS Salt    CARDIAC CATHETERIZATION  01/07/2014   CHOLECYSTECTOMY  ?1987   COLONOSCOPY  2002   Dr. Anwar--> Severe diverticular changes in the region of the sigmoid and descending colon with scattered diverticular changes throughout the rest of the colon. No polyps, ulcerations. Despite numerous  manipulations, the tip of the scope could not be tipped into the cecal area.   COLONOSCOPY  01/10/2012   Procedure: COLONOSCOPY;  Surgeon: Lamar CHRISTELLA Hollingshead, MD;  [facility]: [facility];  Service: Endoscopy;  Laterality: N/A;  1:55   CORONARY ANGIOPLASTY WITH STENT PLACEMENT  ~ 1997 X 2   2 + 1   CORONARY ARTERY BYPASS GRAFT N/A 02/19/2023   Procedure: OFF PUMP CORONARY ARTERY BYPASS GRAFTING (CABG) X 1;  Surgeon: Shyrl Linnie KIDD, MD;  Location: [facility] OR;  Service: Open Heart Surgery;  Laterality: N/A;  LIMA TO LAD   CORONARY BALLOON ANGIOPLASTY N/A 10/05/2022   Procedure: CORONARY BALLOON ANGIOPLASTY;  Surgeon: Wonda Sharper, MD;  Location: [facility] INVASIVE CV LAB;  Service: Cardiovascular;  Laterality: N/A;   CORONARY PRESSURE/FFR STUDY N/A 03/08/2017   Procedure: Intravascular Pressure Wire/FFR Study;  Surgeon: Mady Bruckner, MD;  Location: [facility] INVASIVE CV LAB;  Service: Cardiovascular;  Laterality: N/A;   CORONARY STENT INTERVENTION N/A 03/26/2022   Procedure: CORONARY STENT INTERVENTION;  Surgeon: Court Dorn PARAS, MD;  Location: [facility] INVASIVE CV LAB;  Service: Cardiovascular;  Laterality: N/A;   EYE SURGERY Bilateral 2014   cataract   LEFT HEART CATH AND CORONARY ANGIOGRAPHY N/A 03/08/2017   Procedure: Left Heart Cath and Coronary Angiography;  Surgeon: Mady Bruckner, MD;  Location: [facility]VAS[facility] CV LAB;  Service: Cardiovascular;  Laterality: N/A;   LEFT HEART CATH AND CORONARY ANGIOGRAPHY N/A 03/26/2022   Procedure: LEFT HEART CATH AND CORONARY ANGIOGRAPHY;  Surgeon: Court Dorn PARAS, MD;  Location: [facility]VAS[facility] CV LAB;  Service: Cardiovascular;  Laterality: N/A;   LEFT HEART CATH AND CORONARY ANGIOGRAPHY N/A 10/05/2022   Procedure: LEFT HEART CATH AND CORONARY ANGIOGRAPHY;  Surgeon: Wonda Sharper, MD;  Location: [facility] INVASIVE CV LAB;  Service: Cardiovascular;  Laterality: N/A;   LEFT HEART CATH AND CORONARY ANGIOGRAPHY N/A 02/14/2023   Procedure: LEFT HEART CATH AND CORONARY ANGIOGRAPHY;   Surgeon: [place]azil[place], Peter M, MD;  Location: [facility] INVASIVE CV LAB;  Service: Cardiovascular;  Laterality: N/A;   LEFT HEART CATHETERIZATION WITH CORONARY ANGIOGRAM N/A 01/07/2014   Procedure: LEFT HEART CATHETERIZATION WITH CORONARY ANGIOGRAM;  Surgeon: Ezra GORMAN Shuck, MD;  Location: [facility] CATH LAB;  Service: Cardiovascular;  Laterality: N/A;   TEE WITHOUT CARDIOVERSION  N/A 02/19/2023   Procedure: TRANSESOPHAGEAL ECHOCARDIOGRAM;  Surgeon: Shyrl Linnie KIDD, MD;  Location: [facility] OR;  Service: Open Heart Surgery;  Laterality: N/A;    FAMILY HISTORY: Family History  Problem Relation Age of Onset   Heart attack Mother 88   Diabetes Mother    Hypertension Mother    Hyperlipidemia Mother    Hyperlipidemia Father    Hypertension Father    Heart attack Father 41   Diabetes Sister    Heart attack Brother 32       x 6   Heart disease Brother    Diabetes Brother    Colon cancer Paternal Aunt        67s, died with brain anuerysm   GER disease Daughter    Cervical cancer Daughter    Diabetes Daughter    Crohn's disease Cousin        paternal    SOCIAL HISTORY: Social History   Socioeconomic History   Marital status: Widowed    Spouse name: Not on file   Number of children: 4   Years of education: 66   Highest education level: 11th grade  Occupational History   Occupation: Disability    Employer: DISABLED  Tobacco Use   Smoking status: Never   Smokeless tobacco: Never   Tobacco comments:    spouse, 41 years - husband has quit 01/2011  Vaping Use   Vaping status: Never Used  Substance and Sexual Activity   Alcohol use: No   Drug use: No   Sexual activity: Not Currently  Other Topics Concern   Not on file  Social History Narrative   Lives alone, one level, handicap accessible bathroom, her children all live nearby   Social Drivers of Health   Financial Resource Strain: Low Risk  (01/24/2023)   Overall Financial Resource Strain (CARDIA)    Difficulty of Paying Living Expenses: Not  hard at all  Food Insecurity: No Food Insecurity (05/26/2024)   Hunger Vital Sign    Worried About Running Out of Food in the Last Year: Never true    Ran Out of Food in the Last Year: Never true  Transportation Needs: No Transportation Needs (05/26/2024)   PRAPARE - Administrator, Civil Service (Medical): No    Lack of Transportation (Non-Medical): No  Physical Activity: Insufficiently Active (01/24/2023)   Exercise Vital Sign    Days of Exercise per Week: 3 days    Minutes of Exercise per Session: 30 min  Stress: No Stress Concern Present (01/24/2023)   [employer] of Occupational Health - Occupational Stress Questionnaire    Feeling of Stress : Not at all  Social Connections: Moderately Integrated (05/19/2024)   Social Connection and Isolation Panel    Frequency of Communication with Friends and Family: More than three times a week    Frequency of Social Gatherings with Friends and Family: More than three times a week    Attends Religious Services: More than 4 times per year    Active Member of [employer] or Organizations: Yes    Attends Banker Meetings: More than 4 times per year    Marital Status: Widowed  Intimate Partner Violence: Not At Risk (05/26/2024)   Humiliation, Afraid, Rape, and Kick questionnaire    Fear of Current or Ex-Partner: No    Emotionally Abused: No    Physically Abused: No    Sexually Abused: No    PHYSICAL EXAM  GENERAL EXAM/CONSTITUTIONAL: Vitals:  Vitals:   06/30/24  0913  BP: 139/78  Pulse: 65  Resp: 17  SpO2: 96%  Weight: 162 lb (73.5 kg)  Height: 5' 6 (1.676 m)   Body mass index is 26.15 kg/m. Wt Readings from Last 3 Encounters:  06/30/24 162 lb (73.5 kg)  06/18/24 165 lb 6.4 oz (75 kg)  06/08/24 164 lb (74.4 kg)   Patient is in no distress; well developed, nourished and groomed; neck is supple  MUSCULOSKELETAL: Gait, strength, tone, movements noted in Neurologic exam below  NEUROLOGIC: MENTAL STATUS:      05/06/2024    2:15 PM 01/31/2024    2:30 PM 12/18/2017    3:05 PM  MMSE - Mini Mental State Exam  Orientation to time 5 5 5   Orientation to Place 5 5 5   Registration 3 3 3   Attention/ Calculation 0 5 4  Recall 3 2 2   Language- name 2 objects 2 2 2   Language- repeat 1 1 1   Language- follow 3 step command 3 3 3   Language- read & follow direction 1 1 1   Write a sentence 1 1 0  Copy design 0 0 1  Total score 24 28 27    awake, alert, oriented to person, place and time recent and remote memory intact normal attention and concentration language fluent, comprehension intact, naming intact fund of knowledge appropriate  CRANIAL NERVE:  2nd, 3rd, 4th, 6th - Visual fields full to confrontation, extraocular muscles intact, no nystagmus 5th - facial sensation symmetric 7th - facial strength symmetric 8th - hearing intact 9th - palate elevates symmetrically, uvula midline 11th - shoulder shrug symmetric 12th - tongue protrusion midline  MOTOR:  normal bulk and tone, full strength in the BUE, BLE  SENSORY:  normal and symmetric to light touch  COORDINATION:  finger-nose-finger, fine finger movements normal  GAIT/STATION:  Ambulates with a cane    DIAGNOSTIC DATA (LABS, IMAGING, TESTING) - I reviewed patient records, labs, notes, testing and imaging myself where available.  Lab Results  Component Value Date   WBC 7.7 06/08/2024   HGB 11.5 06/08/2024   HCT 36.0 06/08/2024   MCV 93 06/08/2024   PLT 164 06/08/2024      Component Value Date/Time   NA 138 06/11/2024 0945   K 4.4 06/11/2024 0945   CL 101 06/11/2024 0945   CO2 23 06/11/2024 0945   GLUCOSE 269 (H) 06/11/2024 0945   GLUCOSE 192 (H) 05/20/2024 0509   BUN 10 06/11/2024 0945   CREATININE 1.18 (H) 06/11/2024 0945   CREATININE 1.19 (H) 01/05/2014 1714   CALCIUM  9.0 06/11/2024 0945   PROT 6.2 06/11/2024 0945   ALBUMIN  3.6 (L) 06/11/2024 0945   AST 11 06/11/2024 0945   ALT 6 06/11/2024 0945   ALKPHOS 75  06/11/2024 0945   BILITOT 0.3 06/11/2024 0945   GFRNONAA 52 (L) 05/20/2024 0509   GFRNONAA 63 02/05/2013 1002   GFRAA 53 (L) 10/17/2020 1432   GFRAA 72 02/05/2013 1002   Lab Results  Component Value Date   CHOL 104 01/14/2024   HDL 42 01/14/2024   LDLCALC 49 01/14/2024   LDLDIRECT 93 04/06/2015   TRIG 67 01/14/2024   Lab Results  Component Value Date   HGBA1C 8.5 (H) 06/08/2024   Lab Results  Component Value Date   VITAMINB12 1,130 05/06/2024   Lab Results  Component Value Date   TSH 1.780 05/06/2024    LTM 05/20/2024 This technically difficult study is within normal limits. No seizures or epileptiform discharges were seen throughout the  recording   MRI Brain 01/14/2024 1. No evidence of an acute intracranial abnormality. 2. Mild-to-moderate chronic small vessel ischemic changes within the cerebral white matter and pons, similar to the prior brain MRI of 11/28/2023. 3. Mild-to-moderate generalized cerebral atrophy. 4. Mild right maxillary sinus disease, as described.   Head CT 05/17/2024 No acute intracranial abnormality.     ASSESSMENT AND PLAN  73 y.o. year old female  with   Seizure disorder Seizure disorder with episodes of throat tightness, inability to speak coherently, and altered speech lasting up to 15 minutes. Last significant episode in August led to hospitalization. No seizures since starting Keppra . No family history of seizures except for a niece with past epileptic seizures. No injuries reported during episodes. Keppra  is well-tolerated without side effects. - Continue Keppra  twice daily - Perform lab work to check Keppra  levels - Adjust Keppra  dosage if levels are elevated - Instruct her to call if another seizure occurs    1. Therapeutic drug monitoring   2. Seizures (HCC)     Patient Instructions  Continue with levetiracetam  500 mg twice daily Will check a Levetiracetam  level today Continue your other medications Follow-up in 1 year or  sooner if worse Please contact me if you do have another event   Per [place]  DMV statutes, patients with seizures are not allowed to drive until they have been seizure-free for six months.  Other recommendations include using caution when using heavy equipment or power tools. Avoid working on ladders or at heights. Take showers instead of baths.  Do not swim alone.  Ensure the water  temperature is not too high on the home water  heater. Do not go swimming alone. Do not lock yourself in a room alone (i.e. bathroom). When caring for infants or small children, sit down when holding, feeding, or changing them to minimize risk of injury to the child in the event you have a seizure. Maintain good sleep hygiene. Avoid alcohol.  Also recommend adequate sleep, hydration, good diet and minimize stress.   During the Seizure  - First, ensure adequate ventilation and place patients on the floor on their left side  Loosen clothing around the neck and ensure the airway is patent. If the patient is clenching the teeth, do not force the mouth open with any object as this can cause severe damage - Remove all items from the surrounding that can be hazardous. The patient may be oblivious to what's happening and may not even know what he or she is doing. If the patient is confused and wandering, either gently guide him/her away and block access to outside areas - Reassure the individual and be comforting - Call 911. In most cases, the seizure ends before EMS arrives. However, there are cases when seizures may last over 3 to 5 minutes. Or the individual may have developed breathing difficulties or severe injuries. If a pregnant patient or a person with diabetes develops a seizure, it is prudent to call an ambulance. - Finally, if the patient does not regain full consciousness, then call EMS. Most patients will remain confused for about 45 to 90 minutes after a seizure, so you must use judgment in calling for help. -  Avoid restraints but make sure the patient is in a bed with padded side rails - Place the individual in a lateral position with the neck slightly flexed; this will help the saliva drain from the mouth and prevent the tongue from falling backward - Remove all nearby furniture and other  hazards from the area - Provide verbal assurance as the individual is regaining consciousness - Provide the patient with privacy if possible - Call for help and start treatment as ordered by the caregiver   After the Seizure (Postictal Stage)  After a seizure, most patients experience confusion, fatigue, muscle pain and/or a headache. Thus, one should permit the individual to sleep. For the next few days, reassurance is essential. Being calm and helping reorient the person is also of importance.  Most seizures are painless and end spontaneously. Seizures are not harmful to others but can lead to complications such as stress on the lungs, brain and the heart. Individuals with prior lung problems may develop labored breathing and respiratory distress.    Discussed Patients with epilepsy have a small risk of sudden unexpected death, a condition referred to as sudden unexpected death in epilepsy (SUDEP). SUDEP is defined specifically as the sudden, unexpected, witnessed or unwitnessed, nontraumatic and nondrowning death in patients with epilepsy with or without evidence for a seizure, and excluding documented status epilepticus, in which post mortem examination does not reveal a structural or toxicologic cause for death     Orders Placed This Encounter  Procedures   Levetiracetam  level    Meds ordered this encounter  Medications   levETIRAcetam  (KEPPRA ) 500 MG tablet    Sig: Take 1 tablet (500 mg total) by mouth 2 (two) times daily.    Dispense:  180 tablet    Refill:  3    Return in about 1 year (around 06/30/2025).    Pastor Falling, MD 06/30/2024, 9:54 AM  [facility] [address],  [address] [place], [place] [address] [phone]

## 2024-07-01 ENCOUNTER — Ambulatory Visit: Payer: Self-pay | Admitting: Neurology

## 2024-07-01 LAB — LEVETIRACETAM LEVEL: Levetiracetam Lvl: 37.4 ug/mL (ref 10.0–40.0)

## 2024-07-02 ENCOUNTER — Telehealth: Payer: Self-pay | Admitting: Family Medicine

## 2024-07-02 ENCOUNTER — Ambulatory Visit: Payer: Self-pay

## 2024-07-02 NOTE — Telephone Encounter (Signed)
 Patient reports she applied a new sensor on and she is still receiving sensor error. Asked pt if she has a glucometer available and she said I ain't sticking my finger no more. Denies symptoms of hyper or hypoglycemia. Pt was encouraged to check her BS via glucometer if she develops any symptoms in the meantime, pt verbalized understanding.  Would like replacement READER DEVICE sent to pharmacy below, states she has had her reader for many years and suspects that is the issue.  Pharm[facility]: [facility] [place] [place] - [address] [place] [address] Phone: [phone] Fax: [phone] Hours: Not open 24 hours   Copied from CRM #[id number]. Topic: Clinical - Prescription Issue >> Jul 02, 2024 12:24 PM Wess RAMAN wrote: Reason for CRM: Patient stated that the Continuous Glucose reader (FREESTYLE LIBRE 3 PLUS SENSOR) MISC is not working.  Callback #: [phone]

## 2024-07-02 NOTE — Telephone Encounter (Unsigned)
 Copied from CRM [id number]. Topic: General - Other >> Jul 02, 2024 12:33 PM Tobias CROME wrote: Reason for CRM: Patient requesting for Mliss the pharmacist to call her back. Patient states she spoke to [place] about her insurance and [place] would like to speak to [place].   Ken's phone number is: [phone]  Reqeusting callback: [phone]

## 2024-07-02 NOTE — Telephone Encounter (Signed)
 Advised pt to bring in her reader with her to nurse appt for B12 and between now and then she needs to do fingersticks to check her blood sugars and pt voiced understanding.

## 2024-07-02 NOTE — Telephone Encounter (Signed)
 Patient calling to report that she needs a new reader to check blood sugar because her old one keeps reading as error. The pharmacist, Mliss, is aware and documented at her last visit with patient that patient may need new reader if error message keeps showing. Will address this issue in the other encounter and close this one so that we dont have duplicate messages.

## 2024-07-03 ENCOUNTER — Ambulatory Visit (INDEPENDENT_AMBULATORY_CARE_PROVIDER_SITE_OTHER)

## 2024-07-03 DIAGNOSIS — E538 Deficiency of other specified B group vitamins: Secondary | ICD-10-CM | POA: Diagnosis not present

## 2024-07-03 NOTE — Progress Notes (Signed)
 Patient presented today for B-12 injection. Patient tolerated well

## 2024-07-06 ENCOUNTER — Telehealth: Payer: Self-pay | Admitting: *Deleted

## 2024-07-06 NOTE — Telephone Encounter (Signed)
Pt aware of provider feedback. 

## 2024-07-06 NOTE — Telephone Encounter (Signed)
 She needs to call her endocrinologist regarding uncontrolled blood sugars.

## 2024-07-06 NOTE — Telephone Encounter (Signed)
 Copied from CRM [id number]. Topic: Clinical - Medical Advice >> Jul 06, 2024  1:22 PM Tinnie BROCKS wrote: Reason for CRM: Rea from [facility] calling. PT has been having more and more instability in legs, no weakness or shaking. Elevated blood sugar ratings, about 212. All other vitals within normal parameters. More balance issues today, but nothing severe. From 12-9PM yesterday, blood sugar was above 350. Best callback # is [phone]

## 2024-07-16 ENCOUNTER — Encounter: Payer: Self-pay | Admitting: "Endocrinology

## 2024-07-16 ENCOUNTER — Ambulatory Visit: Admitting: "Endocrinology

## 2024-07-16 VITALS — BP 136/60 | HR 76 | Ht 66.0 in | Wt 166.6 lb

## 2024-07-16 DIAGNOSIS — E1122 Type 2 diabetes mellitus with diabetic chronic kidney disease: Secondary | ICD-10-CM | POA: Diagnosis not present

## 2024-07-16 DIAGNOSIS — I10 Essential (primary) hypertension: Secondary | ICD-10-CM

## 2024-07-16 DIAGNOSIS — Z794 Long term (current) use of insulin: Secondary | ICD-10-CM | POA: Diagnosis not present

## 2024-07-16 DIAGNOSIS — E782 Mixed hyperlipidemia: Secondary | ICD-10-CM

## 2024-07-16 DIAGNOSIS — N1831 Chronic kidney disease, stage 3a: Secondary | ICD-10-CM | POA: Diagnosis not present

## 2024-07-16 DIAGNOSIS — M8580 Other specified disorders of bone density and structure, unspecified site: Secondary | ICD-10-CM

## 2024-07-16 MED ORDER — EMPAGLIFLOZIN 25 MG PO TABS
25.0000 mg | ORAL_TABLET | Freq: Every day | ORAL | 1 refills | Status: AC
Start: 2024-07-16 — End: ?

## 2024-07-16 MED ORDER — NOVOLOG MIX 70/30 FLEXPEN (70-30) 100 UNIT/ML ~~LOC~~ SUPN: 20.0000 [IU] | PEN_INJECTOR | Freq: Two times a day (BID) | SUBCUTANEOUS | 1 refills | Status: DC

## 2024-07-16 NOTE — Patient Instructions (Signed)

## 2024-07-16 NOTE — Progress Notes (Unsigned)
 07/16/2024, 6:33 PM  Endocrinology follow-up note   Subjective:    Patient ID: [name], female    DOB: [birth year].  [name] is being seen in follow-up after she was seen in consultation for management of currently uncontrolled symptomatic diabetes requested by  [name], FNP. She is accompanied by her daughter [name] to clinic today.  Past Medical History:  Diagnosis Date   Anxiety    CAD (coronary artery disease)    DES to circumflex 02/2007, BMS to LAD and PTCA diagonal 03/2007   Carotid artery plaque    Mild   Cataract    Depression    Diverticulitis, colon    Elevated d-dimer 01/08/2014   Essential hypertension, benign    GERD (gastroesophageal reflux disease)    H/O hiatal hernia    HLD (hyperlipidemia)    IDDM (insulin  dependent diabetes mellitus)    Migraine    used to have them really bad; don't have them anymore (01/07/2014)   MS (multiple sclerosis)    Not confirmed   PAT (paroxysmal atrial tachycardia)    Prolapse of uterus    PVD (peripheral vascular disease)    TIA (transient ischemic attack) 1980's    Past Surgical History:  Procedure Laterality Date   ABDOMINAL HYSTERECTOMY  1986   ovaries remain - prolaspe uterus    APPENDECTOMY  ~ 1970   BREAST BIOPSY Right 1980's   BREAST LUMPECTOMY Right 1980's   Dr. MYRTIS Salt    CARDIAC CATHETERIZATION  01/07/2014   CHOLECYSTECTOMY  ?1987   COLONOSCOPY  2002   Dr. Anwar--> Severe diverticular changes in the region of the sigmoid and descending colon with scattered diverticular changes throughout the rest of the colon. No polyps, ulcerations. Despite numerous manipulations, the tip of the scope could not be tipped into the cecal area.   COLONOSCOPY  01/10/2012   Procedure: COLONOSCOPY;  Surgeon: Lamar HERO Hollingshead, MD;  Location: [facility];  Service: Endoscopy;  Laterality: N/A;  1:55   CORONARY ANGIOPLASTY WITH STENT PLACEMENT  ~ 1997 X 2   2 + 1   CORONARY ARTERY  BYPASS GRAFT N/A 02/19/2023   Procedure: OFF PUMP CORONARY ARTERY BYPASS GRAFTING (CABG) X 1;  Surgeon: Shyrl Linnie KIDD, MD;  Location: [facility] OR;  Service: Open Heart Surgery;  Laterality: N/A;  LIMA TO LAD   CORONARY BALLOON ANGIOPLASTY N/A 10/05/2022   Procedure: CORONARY BALLOON ANGIOPLASTY;  Surgeon: Wonda Sharper, MD;  Location: [facility] INVASIVE CV LAB;  Service: Cardiovascular;  Laterality: N/A;   CORONARY PRESSURE/FFR STUDY N/A 03/08/2017   Procedure: Intravascular Pressure Wire/FFR Study;  Surgeon: Mady Bruckner, MD;  Location: [facility] INVASIVE CV LAB;  Service: Cardiovascular;  Laterality: N/A;   CORONARY STENT INTERVENTION N/A 03/26/2022   Procedure: CORONARY STENT INTERVENTION;  Surgeon: Court Dorn PARAS, MD;  Location: [facility] INVASIVE CV LAB;  Service: Cardiovascular;  Laterality: N/A;   EYE SURGERY Bilateral 2014   cataract   LEFT HEART CATH AND CORONARY ANGIOGRAPHY N/A 03/08/2017   Procedure: Left Heart Cath and Coronary Angiography;  Surgeon: Mady Bruckner, MD;  Location: [facility] INVASIVE CV LAB;  Service: Cardiovascular;  Laterality: N/A;   LEFT HEART CATH AND CORONARY ANGIOGRAPHY N/A 03/26/2022   Procedure: LEFT HEART CATH AND CORONARY ANGIOGRAPHY;  Surgeon: Court Dorn PARAS, MD;  Location: [facility] INVASIVE CV LAB;  Service: Cardiovascular;  Laterality: N/A;   LEFT HEART CATH AND CORONARY ANGIOGRAPHY N/A 10/05/2022   Procedure: LEFT HEART CATH  AND CORONARY ANGIOGRAPHY;  Surgeon: Wonda Sharper, MD;  Location: [facility] INVASIVE CV LAB;  Service: Cardiovascular;  Laterality: N/A;   LEFT HEART CATH AND CORONARY ANGIOGRAPHY N/A 02/14/2023   Procedure: LEFT HEART CATH AND CORONARY ANGIOGRAPHY;  Surgeon: Swazil[place], Peter M, MD;  Location: [facility] INVASIVE CV LAB;  Service: Cardiovascular;  Laterality: N/A;   LEFT HEART CATHETERIZATION WITH CORONARY ANGIOGRAM N/A 01/07/2014   Procedure: LEFT HEART CATHETERIZATION WITH CORONARY ANGIOGRAM;  Surgeon: Ezra GORMAN Shuck, MD;  Location: [facility] CATH LAB;  Service: Cardiovascular;   Laterality: N/A;   TEE WITHOUT CARDIOVERSION N/A 02/19/2023   Procedure: TRANSESOPHAGEAL ECHOCARDIOGRAM;  Surgeon: Shyrl Linnie KIDD, MD;  Location: [facility] OR;  Service: Open Heart Surgery;  Laterality: N/A;    Social History   Socioeconomic History   Marital status: Widowed    Spouse name: Not on file   Number of children: 4   Years of education: 17   Highest education level: 11th grade  Occupational History   Occupation: Disability    Employer: DISABLED  Tobacco Use   Smoking status: Never   Smokeless tobacco: Never   Tobacco comments:    spouse, 41 years - husband has quit 01/2011  Vaping Use   Vaping status: Never Used  Substance and Sexual Activity   Alcohol use: No   Drug use: No   Sexual activity: Not Currently  Other Topics Concern   Not on file  Social History Narrative   Lives alone, one level, handicap accessible bathroom, her children all live nearby   Social Drivers of Health   Financial Resource Strain: Low Risk  (01/24/2023)   Overall Financial Resource Strain (CARDIA)    Difficulty of Paying Living Expenses: Not hard at all  Food Insecurity: No Food Insecurity (05/26/2024)   Hunger Vital Sign    Worried About Running Out of Food in the Last Year: Never true    Ran Out of Food in the Last Year: Never true  Transportation Needs: No Transportation Needs (05/26/2024)   PRAPARE - Administrator, Civil Service (Medical): No    Lack of Transportation (Non-Medical): No  Physical Activity: Insufficiently Active (01/24/2023)   Exercise Vital Sign    Days of Exercise per Week: 3 days    Minutes of Exercise per Session: 30 min  Stress: No Stress Concern Present (01/24/2023)   [employer] of Occupational Health - Occupational Stress Questionnaire    Feeling of Stress : Not at all  Social Connections: Moderately Integrated (05/19/2024)   Social Connection and Isolation Panel    Frequency of Communication with Friends and Family: More than three times a week     Frequency of Social Gatherings with Friends and Family: More than three times a week    Attends Religious Services: More than 4 times per year    Active Member of [employer] or Organizations: Yes    Attends Banker Meetings: More than 4 times per year    Marital Status: Widowed    Family History  Problem Relation Age of Onset   Heart attack Mother 79   Diabetes Mother    Hypertension Mother    Hyperlipidemia Mother    Hyperlipidemia Father    Hypertension Father    Heart attack Father 56   Diabetes Sister    Heart attack Brother 32       x 6   Heart disease Brother    Diabetes Brother    Colon cancer Paternal Aunt  69s, died with brain anuerysm   GER disease Daughter    Cervical cancer Daughter    Diabetes Daughter    Crohn's disease Cousin        paternal    Outpatient Encounter Medications as of 07/16/2024  Medication Sig   insulin  aspart protamine  - aspart (NOVOLOG  MIX 70/30 FLEXPEN) (70-30) 100 UNIT/ML FlexPen Inject 20 Units into the skin 2 (two) times daily with a meal.   acetaminophen  (TYLENOL ) 500 MG tablet Take 1,000 mg by mouth every 6 (six) hours as needed for mild pain (pain score 1-3) or headache.   aspirin  81 MG chewable tablet Chew 1 tablet (81 mg total) by mouth daily.   Blood Glucose Monitoring Suppl DEVI 1 each by Does not apply route in the morning, at noon, and at bedtime. May substitute to any manufacturer covered by patient's insurance.   clopidogrel  (PLAVIX ) 75 MG tablet Take 1 tablet (75 mg total) by mouth daily with breakfast.   Continuous Glucose Sensor (FREESTYLE LIBRE 3 PLUS SENSOR) MISC Change sensor every 15 days. Apply to back of upper arm. DX: E11.65   empagliflozin  (JARDIANCE ) 25 MG TABS tablet Take 1 tablet (25 mg total) by mouth daily before breakfast.   famotidine  (PEPCID ) 20 MG tablet Take 1 tablet (20 mg total) by mouth 2 (two) times daily.   furosemide  (LASIX ) 20 MG tablet TAKE ONE (1) TABLET BY MOUTH EVERY DAY    Homeopathic Products (MUSCLE CRAMP COMPLEX PO) Take 1 tablet by mouth as needed (muscle spasms).   isosorbide  mononitrate (IMDUR ) 60 MG 24 hr tablet Take 1 tablet (60 mg total) by mouth daily.   levETIRAcetam  (KEPPRA ) 500 MG tablet Take 1 tablet (500 mg total) by mouth 2 (two) times daily.   levocetirizine (XYZAL ) 5 MG tablet TAKE 1/2 TABLET BY MOUTH EVERY EVENING   linagliptin  (TRADJENTA ) 5 MG TABS tablet Take 1 tablet (5 mg total) by mouth daily.   lisinopril  (ZESTRIL ) 10 MG tablet Take 1 tablet (10 mg total) by mouth daily.   loratadine  (CLARITIN ) 10 MG tablet Take 10 mg by mouth daily as needed for allergies.   metoprolol  tartrate (LOPRESSOR ) 25 MG tablet Take 1 tablet (25 mg total) by mouth 2 (two) times daily.   nitroGLYCERIN  (NITROSTAT ) 0.4 MG SL tablet DISSOLVE 1 TABLET UNDER TONGUE FOR CHESTPAIN.MAY REPEAT EVERY 5 MINUTES FOR 3 DOSES.IF NO RELIEF CALL 911 OR GO TO ER   potassium chloride  (KLOR-CON  M) 10 MEQ tablet Take 2 tablets (20 mEq total) by mouth 2 (two) times daily.   rOPINIRole  (REQUIP ) 1 MG tablet TAKE 1 TABLET BY MOUTH DAILY AS NEEDED   rosuvastatin  (CRESTOR ) 20 MG tablet TAKE ONE (1) TABLET BY MOUTH EVERY DAY   sertraline  (ZOLOFT ) 50 MG tablet Take 1.5 tablets (75 mg total) by mouth daily.   traZODone  (DESYREL ) 50 MG tablet Take 1 tablet (50 mg total) by mouth at bedtime as needed for sleep.   [DISCONTINUED] empagliflozin  (JARDIANCE ) 10 MG TABS tablet Take 1 tablet (10 mg total) by mouth daily before breakfast.   [DISCONTINUED] insulin  aspart (NOVOLOG  FLEXPEN) 100 UNIT/ML FlexPen Inject 5-8 Units into the skin 3 (three) times daily before meals.   [DISCONTINUED] insulin  degludec (TRESIBA  FLEXTOUCH) 200 UNIT/ML FlexTouch Pen Inject 30 Units into the skin at bedtime.   Facility-Administered Encounter Medications as of 07/16/2024  Medication   cyanocobalamin  (VITAMIN B12) injection 1,000 mcg   cyanocobalamin  (VITAMIN B12) injection 1,000 mcg    ALLERGIES: Allergies   Allergen Reactions   Iohexol   Anaphylaxis and Nausea Only    Pt had syncopal episode with nausea post IV CM late 1990's,  pt has had prednisone  prep with heart caths x 2 without problem  kdean 04/16/07, Onset Date: 04-15-1996   Codeine Nausea And Vomiting and Palpitations   Metformin  And Related Diarrhea   Ticlid [Ticlopidine Hcl] Nausea And Vomiting   Jardiance  [Empagliflozin ] Other (See Comments)    Recurrent UTIs    VACCINATION STATUS: Immunization History  Administered Date(s) Administered   Fluad Quad(high Dose 65+) 06/17/2019, 07/09/2020, 06/09/2021, 06/20/2022   Fluad Trivalent(High Dose 65+) 06/19/2023   INFLUENZA, HIGH DOSE SEASONAL PF 06/19/2017, 06/18/2018, 06/01/2024   Influenza,inj,Quad PF,6+ Mos 06/28/2015   Influenza-Unspecified 05/25/2014, 06/15/2016   Moderna Sars-Covid-2 Vaccination 11/19/2019, 12/18/2019   Pneumococcal Conjugate-13 02/08/2014   Pneumococcal Polysaccharide-23 06/28/2015   Tdap 11/22/2010, 12/11/2021   Zoster Recombinant(Shingrix ) 12/11/2021, 03/12/2022    Diabetes She presents for her follow-up diabetic visit. She has type 2 diabetes mellitus. Onset time: She was diagnosed at approximate age of 38 years. Her disease course has been fluctuating. Pertinent negatives for hypoglycemia include no confusion, dizziness, headaches, pallor, seizures or sweats. Pertinent negatives for diabetes include no chest pain, no fatigue, no polydipsia, no polyphagia and no polyuria. There are no hypoglycemic complications. Symptoms are worsening. Diabetic complications include a CVA, heart disease and nephropathy. Risk factors for coronary artery disease include diabetes mellitus, dyslipidemia, family history, hypertension, post-menopausal and sedentary lifestyle. Current diabetic treatment includes insulin  injections (She is currently on Tresiba  40 units nightly, NovoLog  0-6 units daily, linagliptin  5 mg daily.). Her weight is fluctuating minimally. She is following a  generally unhealthy diet. When asked about meal planning, she reported none. She has not had a previous visit with a dietitian. She participates in exercise intermittently. Her home blood glucose trend is fluctuating minimally. Her breakfast blood glucose range is generally 180-200 mg/dl. Her lunch blood glucose range is generally >200 mg/dl. Her dinner blood glucose range is generally >200 mg/dl. Her bedtime blood glucose range is generally >200 mg/dl. Her overall blood glucose range is >200 mg/dl. (She is accompanied by her daughter [name] to clinic today.  Her AGP report shows improved profile relatively with 20% time range, 34% level 1 hyperglycemia, 46% level 2 hyperglycemia.  Glycemia.  Her average blood glucose is still high at 253 mg per DL with glucose variability of 32.7%.  Her recent A1c was 8.5%.) An ACE inhibitor/angiotensin II receptor blocker is being taken.  Hyperlipidemia This is a chronic problem. The problem is uncontrolled. Exacerbating diseases include chronic renal disease and diabetes. Pertinent negatives include no chest pain, myalgias or shortness of breath. Current antihyperlipidemic treatment includes statins. Risk factors for coronary artery disease include diabetes mellitus, dyslipidemia, hypertension, a sedentary lifestyle and post-menopausal.  Hypertension This is a chronic problem. The current episode started more than 1 year ago. The problem is controlled. Pertinent negatives include no chest pain, headaches, palpitations, shortness of breath or sweats. Risk factors for coronary artery disease include dyslipidemia, diabetes mellitus, family history, post-menopausal state and sedentary lifestyle. Past treatments include ACE inhibitors. Hypertensive end-organ damage includes kidney disease, CAD/MI and CVA. Identifiable causes of hypertension include chronic renal disease.     Objective:       07/16/2024    3:48 PM 06/30/2024    9:13 AM 06/18/2024    9:42 AM  Vitals with  BMI  Height 5' 6 5' 6 5' 6  Weight 166 lbs 10 oz 162 lbs 165 lbs 6 oz  BMI  26.9 26.16 26.71  Systolic 136 139 867  Diastolic 60 78 70  Pulse 76 65 72    BP 136/60   Pulse 76   Ht 5' 6 (1.676 m)   Wt 166 lb 9.6 oz (75.6 kg)   BMI 26.89 kg/m   Wt Readings from Last 3 Encounters:  07/16/24 166 lb 9.6 oz (75.6 kg)  06/30/24 162 lb (73.5 kg)  06/18/24 165 lb 6.4 oz (75 kg)      CMP ( most recent) CMP     Component Value Date/Time   NA 138 06/11/2024 0945   K 4.4 06/11/2024 0945   CL 101 06/11/2024 0945   CO2 23 06/11/2024 0945   GLUCOSE 269 (H) 06/11/2024 0945   GLUCOSE 192 (H) 05/20/2024 0509   BUN 10 06/11/2024 0945   CREATININE 1.18 (H) 06/11/2024 0945   CREATININE 1.19 (H) 01/05/2014 1714   CALCIUM  9.0 06/11/2024 0945   PROT 6.2 06/11/2024 0945   ALBUMIN  3.6 (L) 06/11/2024 0945   AST 11 06/11/2024 0945   ALT 6 06/11/2024 0945   ALKPHOS 75 06/11/2024 0945   BILITOT 0.3 06/11/2024 0945   EGFR 48 (L) 06/11/2024 0945   GFRNONAA 52 (L) 05/20/2024 0509   GFRNONAA 63 02/05/2013 1002     Diabetic Labs (most recent): Lab Results  Component Value Date   HGBA1C 8.5 (H) 06/08/2024   HGBA1C 8.0 (H) 02/28/2024   HGBA1C 9.1 (H) 11/28/2023   MICROALBUR negative 04/26/2014   MICROALBUR neg 06/11/2013     Lipid Panel ( most recent) Lipid Panel     Component Value Date/Time   CHOL 104 01/14/2024 0832   CHOL 121 05/08/2023 0808   CHOL 107 02/05/2013 1002   TRIG 67 01/14/2024 0832   TRIG 149 11/30/2015 0947   TRIG 143 02/05/2013 1002   HDL 42 01/14/2024 0832   HDL 46 05/08/2023 0808   HDL 35 (L) 11/30/2015 0947   HDL 35 (L) 02/05/2013 1002   CHOLHDL 2.5 01/14/2024 0832   VLDL 13 01/14/2024 0832   LDLCALC 49 01/14/2024 0832   LDLCALC 56 05/08/2023 0808   LDLCALC 195 (H) 04/26/2014 1029   LDLCALC 43 02/05/2013 1002   LDLDIRECT 93 04/06/2015 1038   LABVLDL 19 05/08/2023 0808      TSH was 1.78 on May 06, 2024   Assessment & Plan:   1. Type 2  diabetes mellitus with stage 3a chronic kidney disease, with long-term current use of insulin  (HCC) (Primary)  - SHAYMA PFEFFERLE has currently uncontrolled symptomatic type 2 DM since  73 years of age. Recent labs reviewed.  She is accompanied by her daughter [name] to clinic today.  Her AGP report shows improved profile relatively with 20% time range, 34% level 1 hyperglycemia, 46% level 2 hyperglycemia.  Glycemia.  Her average blood glucose is still high at 253 mg per DL with glucose variability of 32.7%.  Her recent A1c was 8.5%. - I had a long discussion with her about the possible risk factors and  the pathology behind diabetes and its complications. -her diabetes is complicated by coronary artery disease, CVA, CKD, disequilibrium/sedentary life, and she remains at a high risk for more acute and chronic complications which include CAD, CVA, CKD, retinopathy, and neuropathy. These are all discussed in detail with her.  - I discussed all available options of managing her diabetes including de-escalation of medications. Patient is encouraged to switch to  unprocessed or minimally processed  complex starch, adequate protein intake (mainly  plant source), minimal liquid fat, plenty of fruits, and vegetables. -  she is advised to stick to a routine mealtimes to eat 3 complete meals a day and snack only when necessary (to snack only to correct hypoglycemia BG <70 day time or <100 at night).   - she acknowledges that there is a room for improvement in her food and drink choices. - Further Specific Suggestion is made for her to avoid simple carbohydrates  from her diet including Cakes, Sweet Desserts, Ice Cream, Soda (diet and regular), Sweet Tea, Candies, Chips, Cookies, Store Bought Juices, Alcohol ,  Artificial Sweeteners,  Coffee Creamer, and Sugar-free Products. This will help patient to have more stable blood glucose profile and potentially avoid unintended weight gain.  - she will be scheduled with  Santana Duke, RDN, CDE for individualized diabetes education.  - I have approached her with the following individualized plan to manage  her diabetes and patient agrees:   - In light of long duration of diabetes and her current glycemic burden, she will continue to need multiple daily injections of insulin  in order for her to achieve control of diabetes to target. -However, patient eats twice a day hence she has to be on a regimen as suitable for utility of twice a day with meals.    - Accordingly, I discussed and switched her insulin  to NovoLog  70/30 20 units with breakfast and 20 units with supper only when her Premeal blood glucose readings are above 90 mg per DL.   She is advised to discontinue Tresiba  and her current fast-acting NovoLog  when she receives prescription for NovoLog  70/30. She will continue to use her CGM at all times. - she is warned not to take insulin  without proper monitoring per orders. - Adjustment parameters are given to her for hypo and hyperglycemia in writing. - she is encouraged to call clinic for blood glucose levels less than 70 or above 200 mg /dl weekly average. -For renal and cardiovascular benefit, I discussed and initiated Jardiance , tolerated 10 mg, discussed and increase Jardiance  to 25 mg p.o. daily with breakfast.   She reports intolerance to metformin . -She is also advised to finish her current supply of Tradjenta  5 mg p.o. daily at breakfast until she finishes her current supplies and this medication will be stopped going forward. - By utilizing SGLT2 inhibitors and /or GLP-1 receptor agonists, the goal is to keep insulin  to the minimum for simplicity reasons.     - Specific targets for  A1c;  LDL, HDL,  and Triglycerides were discussed with the patient.  2) Blood Pressure /Hypertension: Her blood pressure is controlled to target.  she is advised to continue her current medications including lisinopril  10 mg p.o. daily with breakfast . 3)  Lipids/Hyperlipidemia:   Review of her recent lipid panel showed  controlled  LDL at 49 .  she  is advised to continue Crestor  20 mg p.o. daily at bedtime.   Side effects and precautions discussed with her.  4)  Weight/Diet:  Body mass index is 26.89 kg/m.  -     she is not a candidate for major weight loss. I discussed with her the fact that loss of 5 - 10% of her  current body weight will have the most impact on her diabetes management.  The above detailed  ACLM recommendations for nutrition, exercise, sleep, social life, avoidance of risky substances, the need for restorative sleep  information is also detailed on discharge instructions.  5) Chronic Care/Health Maintenance:  -  she  is on ACEI/ARB and Statin medications and  is encouraged to initiate and continue to follow up with Ophthalmology, cardiology, nephrology ,dentist,  Podiatrist at least yearly or according to recommendations, and advised to   stay away from smoking. I have recommended yearly flu vaccine and pneumonia vaccine at least every 5 years; moderate intensity exercise for up to 150 minutes weekly; and  sleep for 7- 9 hours a day.  Prior medical records show that that she has osteopenia based on bone density done for her in March 2019.  She will be considered for repeat bone density after her next visit.  - she is  advised to maintain close follow up with [name], FNP for primary care needs, as well as her other providers for optimal and coordinated care.  I spent  40  minutes in the care of the patient today including review of labs from CMP, Lipids, Thyroid  Function, Hematology (current and previous including abstractions from other facilities); face-to-face time discussing  her blood glucose readings/logs, discussing hypoglycemia and hyperglycemia episodes and symptoms, medications doses, her options of short and long term treatment based on the latest standards of care / guidelines;  discussion about incorporating  lifestyle medicine;  and documenting the encounter. Risk reduction counseling performed per USPSTF guidelines to reduce cardiovascular risk factors.     Please refer to Patient Instructions for Blood Glucose Monitoring and Insulin /Medications Dosing Guide  in media tab for additional information. Please  also refer to  Patient Self Inventory in the Media  tab for reviewed elements of pertinent patient history.  [name] and her daughter [name] participated in the discussions, expressed understanding, and voiced agreement with the above plans.  All questions were answered to her satisfaction. she is encouraged to contact clinic should she have any questions or concerns prior to her return visit.   Follow up plan: - Return in about 9 weeks (around 09/17/2024) for F/U with Pre-visit Labs, Meter/CGM/Logs, A1c here, DXA Scan B4 NV.  Ranny Earl, MD [facility] [address] [place], [place] [address] Phone: [phone]  Fax: [phone]    07/16/2024, 6:33 PM  This note was partially dictated with voice recognition software. Similar sounding words can be transcribed inadequately or may not  be corrected upon review.

## 2024-07-17 ENCOUNTER — Encounter: Payer: Self-pay | Admitting: Family Medicine

## 2024-07-18 ENCOUNTER — Observation Stay (HOSPITAL_COMMUNITY)
Admission: EM | Admit: 2024-07-18 | Discharge: 2024-07-19 | Disposition: A | Discharge status: Home / Self Care | Attending: Internal Medicine | Admitting: Internal Medicine

## 2024-07-18 ENCOUNTER — Emergency Department (HOSPITAL_COMMUNITY)

## 2024-07-18 ENCOUNTER — Other Ambulatory Visit: Payer: Self-pay

## 2024-07-18 ENCOUNTER — Encounter (HOSPITAL_COMMUNITY): Payer: Self-pay

## 2024-07-18 DIAGNOSIS — D72829 Elevated white blood cell count, unspecified: Secondary | ICD-10-CM | POA: Insufficient documentation

## 2024-07-18 DIAGNOSIS — Z79899 Other long term (current) drug therapy: Secondary | ICD-10-CM | POA: Insufficient documentation

## 2024-07-18 DIAGNOSIS — R296 Repeated falls: Secondary | ICD-10-CM | POA: Diagnosis not present

## 2024-07-18 DIAGNOSIS — Z794 Long term (current) use of insulin: Secondary | ICD-10-CM | POA: Diagnosis not present

## 2024-07-18 DIAGNOSIS — E162 Hypoglycemia, unspecified: Secondary | ICD-10-CM | POA: Diagnosis not present

## 2024-07-18 DIAGNOSIS — R42 Dizziness and giddiness: Principal | ICD-10-CM | POA: Insufficient documentation

## 2024-07-18 DIAGNOSIS — E11649 Type 2 diabetes mellitus with hypoglycemia without coma: Secondary | ICD-10-CM | POA: Insufficient documentation

## 2024-07-18 DIAGNOSIS — E1169 Type 2 diabetes mellitus with other specified complication: Secondary | ICD-10-CM | POA: Diagnosis not present

## 2024-07-18 DIAGNOSIS — Z7982 Long term (current) use of aspirin: Secondary | ICD-10-CM | POA: Insufficient documentation

## 2024-07-18 DIAGNOSIS — E1122 Type 2 diabetes mellitus with diabetic chronic kidney disease: Secondary | ICD-10-CM | POA: Insufficient documentation

## 2024-07-18 DIAGNOSIS — D519 Vitamin B12 deficiency anemia, unspecified: Secondary | ICD-10-CM | POA: Insufficient documentation

## 2024-07-18 DIAGNOSIS — R531 Weakness: Principal | ICD-10-CM

## 2024-07-18 DIAGNOSIS — F32A Depression, unspecified: Secondary | ICD-10-CM | POA: Diagnosis present

## 2024-07-18 DIAGNOSIS — I2510 Atherosclerotic heart disease of native coronary artery without angina pectoris: Secondary | ICD-10-CM | POA: Diagnosis not present

## 2024-07-18 DIAGNOSIS — N1831 Chronic kidney disease, stage 3a: Secondary | ICD-10-CM | POA: Diagnosis present

## 2024-07-18 DIAGNOSIS — N1832 Chronic kidney disease, stage 3b: Secondary | ICD-10-CM | POA: Diagnosis present

## 2024-07-18 DIAGNOSIS — E785 Hyperlipidemia, unspecified: Secondary | ICD-10-CM | POA: Diagnosis not present

## 2024-07-18 DIAGNOSIS — R569 Unspecified convulsions: Secondary | ICD-10-CM | POA: Diagnosis not present

## 2024-07-18 DIAGNOSIS — E538 Deficiency of other specified B group vitamins: Secondary | ICD-10-CM | POA: Diagnosis present

## 2024-07-18 DIAGNOSIS — I10 Essential (primary) hypertension: Secondary | ICD-10-CM | POA: Insufficient documentation

## 2024-07-18 DIAGNOSIS — E782 Mixed hyperlipidemia: Secondary | ICD-10-CM | POA: Diagnosis present

## 2024-07-18 HISTORY — DX: Unspecified dementia, unspecified severity, without behavioral disturbance, psychotic disturbance, mood disturbance, and anxiety: F03.90

## 2024-07-18 HISTORY — DX: Acute myocardial infarction, unspecified: I21.9

## 2024-07-18 HISTORY — DX: Unspecified osteoarthritis, unspecified site: M19.90

## 2024-07-18 HISTORY — DX: Chronic kidney disease, unspecified: N18.9

## 2024-07-18 HISTORY — DX: Sleep apnea, unspecified: G47.30

## 2024-07-18 HISTORY — DX: Cerebral infarction, unspecified: I63.9

## 2024-07-18 LAB — URINALYSIS, ROUTINE W REFLEX MICROSCOPIC
Bacteria, UA: NONE SEEN
Bilirubin Urine: NEGATIVE
Glucose, UA: >=500 mg/dL — AB
Ketones, ur: NEGATIVE mg/dL
Leukocytes,Ua: NEGATIVE
Nitrite: NEGATIVE
Protein, ur: NEGATIVE mg/dL
Specific Gravity, Urine: 1.012 (ref 1.005–1.030)
pH: 5 (ref 5.0–8.0)

## 2024-07-18 LAB — CBC WITH DIFFERENTIAL/PLATELET
Abs Immature Granulocytes: 0.03 K/uL (ref 0.00–0.07)
Basophils Absolute: 0 K/uL (ref 0.0–0.1)
Basophils Relative: 0 %
Eosinophils Absolute: 0.1 K/uL (ref 0.0–0.5)
Eosinophils Relative: 1 %
HCT: 33 % — ABNORMAL LOW (ref 36.0–46.0)
Hemoglobin: 10.9 g/dL — ABNORMAL LOW (ref 12.0–15.0)
Immature Granulocytes: 0 %
Lymphocytes Relative: 43 %
Lymphs Abs: 5.1 K/uL — ABNORMAL HIGH (ref 0.7–4.0)
MCH: 29.5 pg (ref 26.0–34.0)
MCHC: 33 g/dL (ref 30.0–36.0)
MCV: 89.4 fL (ref 80.0–100.0)
Monocytes Absolute: 1 K/uL (ref 0.1–1.0)
Monocytes Relative: 8 %
Neutro Abs: 5.7 K/uL (ref 1.7–7.7)
Neutrophils Relative %: 48 %
Platelets: 162 K/uL (ref 150–400)
RBC: 3.69 MIL/uL — ABNORMAL LOW (ref 3.87–5.11)
RDW: 13.4 % (ref 11.5–15.5)
WBC: 12 K/uL — ABNORMAL HIGH (ref 4.0–10.5)
nRBC: 0 % (ref 0.0–0.2)

## 2024-07-18 LAB — COMPREHENSIVE METABOLIC PANEL WITH GFR
ALT: 8 U/L (ref 0–44)
AST: 15 U/L (ref 15–41)
Albumin: 3.9 g/dL (ref 3.5–5.0)
Alkaline Phosphatase: 82 U/L (ref 38–126)
Anion gap: 10 (ref 5–15)
BUN: 14 mg/dL (ref 8–23)
CO2: 25 mmol/L (ref 22–32)
Calcium: 9.4 mg/dL (ref 8.9–10.3)
Chloride: 105 mmol/L (ref 98–111)
Creatinine, Ser: 1.37 mg/dL — ABNORMAL HIGH (ref 0.44–1.00)
GFR, Estimated: 40 mL/min — ABNORMAL LOW (ref >60)
Glucose, Bld: 66 mg/dL — ABNORMAL LOW (ref 70–99)
Potassium: 4.6 mmol/L (ref 3.5–5.1)
Sodium: 140 mmol/L (ref 135–145)
Total Bilirubin: 0.4 mg/dL (ref 0.0–1.2)
Total Protein: 7.1 g/dL (ref 6.5–8.1)

## 2024-07-18 LAB — CBG MONITORING, ED
Glucose-Capillary: 90 mg/dL (ref 70–99)
Glucose-Capillary: 110 mg/dL — ABNORMAL HIGH (ref 70–99)
Glucose-Capillary: 62 mg/dL — ABNORMAL LOW (ref 70–99)

## 2024-07-18 LAB — GLUCOSE, CAPILLARY: Glucose-Capillary: 135 mg/dL — ABNORMAL HIGH (ref 70–99)

## 2024-07-18 MED ORDER — LACTATED RINGERS IV SOLN: INTRAVENOUS | Status: DC

## 2024-07-18 MED ORDER — HEPARIN SODIUM (PORCINE) 5000 UNIT/ML IJ SOLN: 5000.0000 [IU] | Freq: Three times a day (TID) | INTRAMUSCULAR | Status: DC

## 2024-07-18 MED ORDER — ACETAMINOPHEN 325 MG PO TABS
650.0000 mg | ORAL_TABLET | Freq: Four times a day (QID) | ORAL | Status: DC | PRN
Start: 2024-07-18 — End: 2024-07-19
  Administered 2024-07-18: 650 mg via ORAL
  Filled 2024-07-18: qty 2

## 2024-07-18 MED ORDER — METOPROLOL TARTRATE 25 MG PO TABS
25.0000 mg | ORAL_TABLET | Freq: Two times a day (BID) | ORAL | Status: DC
  Administered 2024-07-18 – 2024-07-19 (×2): 25 mg via ORAL
  Filled 2024-07-18 (×2): qty 1

## 2024-07-18 MED ORDER — ROSUVASTATIN CALCIUM 20 MG PO TABS
20.0000 mg | ORAL_TABLET | Freq: Every day | ORAL | Status: DC
  Administered 2024-07-19: 20 mg via ORAL
  Filled 2024-07-18: qty 1

## 2024-07-18 MED ORDER — LEVETIRACETAM 500 MG PO TABS
500.0000 mg | ORAL_TABLET | Freq: Two times a day (BID) | ORAL | Status: DC
  Administered 2024-07-18 – 2024-07-19 (×2): 500 mg via ORAL
  Filled 2024-07-18 (×2): qty 1

## 2024-07-18 MED ORDER — CLOPIDOGREL BISULFATE 75 MG PO TABS
75.0000 mg | ORAL_TABLET | Freq: Every day | ORAL | Status: DC
  Administered 2024-07-19: 75 mg via ORAL
  Filled 2024-07-18: qty 1

## 2024-07-18 MED ORDER — HYDRALAZINE HCL 20 MG/ML IJ SOLN: 5.0000 mg | INTRAMUSCULAR | Status: DC | PRN

## 2024-07-18 MED ORDER — ACETAMINOPHEN 650 MG RE SUPP: 650.0000 mg | Freq: Four times a day (QID) | RECTAL | Status: DC | PRN

## 2024-07-18 MED ORDER — INSULIN ASPART 100 UNIT/ML IJ SOLN: 0.0000 [IU] | Freq: Every day | INTRAMUSCULAR | Status: DC

## 2024-07-18 MED ORDER — MECLIZINE HCL 12.5 MG PO TABS
25.0000 mg | ORAL_TABLET | Freq: Three times a day (TID) | ORAL | Status: DC
  Administered 2024-07-18 – 2024-07-19 (×2): 25 mg via ORAL
  Filled 2024-07-18 (×2): qty 2

## 2024-07-18 MED ORDER — INSULIN ASPART 100 UNIT/ML IJ SOLN
0.0000 [IU] | Freq: Three times a day (TID) | INTRAMUSCULAR | Status: DC
  Administered 2024-07-19: 2 [IU] via SUBCUTANEOUS
  Administered 2024-07-19: 3 [IU] via SUBCUTANEOUS

## 2024-07-18 MED ORDER — SODIUM CHLORIDE 0.9 % IV BOLUS
500.0000 mL | Freq: Once | INTRAVENOUS | Status: AC
  Administered 2024-07-18: 500 mL via INTRAVENOUS

## 2024-07-18 MED ORDER — INSULIN GLARGINE-YFGN 100 UNIT/ML ~~LOC~~ SOLN
25.0000 [IU] | Freq: Every day | SUBCUTANEOUS | Status: DC
  Administered 2024-07-19: 25 [IU] via SUBCUTANEOUS
  Filled 2024-07-18 (×2): qty 0.25

## 2024-07-18 MED ORDER — SERTRALINE HCL 50 MG PO TABS
75.0000 mg | ORAL_TABLET | Freq: Every day | ORAL | Status: DC
  Administered 2024-07-19: 75 mg via ORAL
  Filled 2024-07-18: qty 2

## 2024-07-18 MED ORDER — TRAZODONE HCL 50 MG PO TABS
50.0000 mg | ORAL_TABLET | Freq: Every evening | ORAL | Status: DC | PRN
  Administered 2024-07-18: 50 mg via ORAL
  Filled 2024-07-18: qty 1

## 2024-07-18 MED ORDER — NITROGLYCERIN 0.4 MG SL SUBL: 0.4000 mg | SUBLINGUAL_TABLET | SUBLINGUAL | Status: DC | PRN

## 2024-07-18 MED ORDER — ISOSORBIDE MONONITRATE ER 60 MG PO TB24
60.0000 mg | ORAL_TABLET | Freq: Every day | ORAL | Status: DC
  Administered 2024-07-19: 60 mg via ORAL
  Filled 2024-07-18: qty 1

## 2024-07-18 MED ORDER — CYANOCOBALAMIN 1000 MCG/ML IJ SOLN: 1000.0000 ug | INTRAMUSCULAR | Status: DC

## 2024-07-18 MED ORDER — PANTOPRAZOLE SODIUM 40 MG PO TBEC
40.0000 mg | DELAYED_RELEASE_TABLET | Freq: Every day | ORAL | Status: DC
  Administered 2024-07-18 – 2024-07-19 (×2): 40 mg via ORAL
  Filled 2024-07-18 (×2): qty 1

## 2024-07-18 MED ORDER — LISINOPRIL 10 MG PO TABS
10.0000 mg | ORAL_TABLET | Freq: Every day | ORAL | Status: DC
  Administered 2024-07-19: 10 mg via ORAL
  Filled 2024-07-18: qty 1

## 2024-07-18 MED ORDER — INSULIN GLARGINE-YFGN 100 UNIT/ML ~~LOC~~ SOLN: 25.0000 [IU] | Freq: Every day | SUBCUTANEOUS | Status: DC

## 2024-07-18 MED ORDER — ASPIRIN 81 MG PO CHEW
81.0000 mg | CHEWABLE_TABLET | Freq: Every day | ORAL | Status: DC
  Administered 2024-07-19: 81 mg via ORAL
  Filled 2024-07-18: qty 1

## 2024-07-18 NOTE — ED Notes (Signed)
 Report given to  [facility] at this time. No questions, comments, or concerns after report was given.

## 2024-07-18 NOTE — ED Triage Notes (Signed)
 Pt comes from home general weakness. Pt has equal grips on both sides per EMS.   3 falls today and has been weak since she has gotten up this morning.   Hx of diabetes.   BG 373 -> 217 after   150/84 BP 62 HR

## 2024-07-18 NOTE — H&P (Addendum)
 [facility] H&P   Patient Demographics:    [name], is a 73 y.o. female  MRN: [id number]   DOB - 06-Oct-1950  Admit Date - 07/18/2024  Outpatient Primary MD for the patient is [name], FNP  Patient coming from: Home  Chief Complaint  Patient presents with   Weakness      HPI:    [name]  is a 73 y.o. 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  Will multiple hospitalization for seizure-like activities with concern for seizures, she was started on Keppra . - Presents to ED today due to multiple events of dizziness, vertigo and fall, patient reports that this resembles her previous vertigo in the past, she had a fall against nightstand, he had another 3 falls after that, she reports feeling room spinning, vertigo, loss of balance, she denies any focal deficits, loss of consciousness, diplopia or vision field cuts, no tingling or numbness.  Was found to be hyperglycemic by EMS with blood glucose in the 300 range. - In the ED workup significant for blood glucose at 66, confirmed by CBG of 62, creatinine at baseline of 1.37, white blood cell count elevated at 12, UA still pending, chest x-ray with no acute findings, CT head and cervical spine with no acute findings, [facility] hospitalist consulted to admit.    Review of systems:   A full 10 point Review of Systems was done, except as stated above, all other Review of Systems were negative.   With Past History of the following :    Past Medical History:  Diagnosis Date   Anxiety    CAD (coronary artery disease)    DES to circumflex 02/2007, BMS to LAD and PTCA diagonal 03/2007   Carotid artery plaque    Mild   Cataract    Depression     Diverticulitis, colon    Elevated d-dimer 01/08/2014   Essential hypertension, benign    GERD (gastroesophageal reflux disease)    H/O hiatal hernia    HLD (hyperlipidemia)    IDDM (insulin  dependent diabetes mellitus)    Migraine    used to have them really bad; don't have them anymore (01/07/2014)   MS (multiple sclerosis)    Not confirmed   PAT (paroxysmal atrial tachycardia)    Prolapse of uterus    PVD (peripheral vascular disease)    TIA (transient ischemic attack) 1980's      Past Surgical History:  Procedure Laterality Date   ABDOMINAL HYSTERECTOMY  1986   ovaries remain - prolaspe uterus    APPENDECTOMY  ~ 1970   BREAST BIOPSY Right 1980's   BREAST LUMPECTOMY Right 1980's   Dr. MYRTIS Salt    CARDIAC CATHETERIZATION  01/07/2014   CHOLECYSTECTOMY  ?1987   COLONOSCOPY  2002   Dr. Anwar--> Severe diverticular changes in the region of the sigmoid and descending colon with scattered diverticular changes throughout the rest of the colon. No polyps, ulcerations. Despite numerous manipulations, the tip of the scope could not be tipped into the cecal area.   COLONOSCOPY  01/10/2012   Procedure: COLONOSCOPY;  Surgeon: Lamar CHRISTELLA Hollingshead, MD;  Location: [facility];  Service: Endoscopy;  Laterality: N/A;  1:55   CORONARY ANGIOPLASTY WITH STENT PLACEMENT  ~ 1997 X 2   2 + 1   CORONARY ARTERY BYPASS GRAFT N/A 02/19/2023   Procedure: OFF PUMP CORONARY ARTERY BYPASS GRAFTING (CABG) X 1;  Surgeon: Shyrl Linnie KIDD, MD;  Location: [facility] OR;  Service: Open Heart Surgery;  Laterality: N/A;  LIMA TO LAD   CORONARY BALLOON ANGIOPLASTY N/A 10/05/2022   Procedure: CORONARY BALLOON ANGIOPLASTY;  Surgeon: Wonda Sharper, MD;  Location: [facility] INVASIVE CV LAB;  Service: Cardiovascular;  Laterality: N/A;   CORONARY PRESSURE/FFR STUDY N/A 03/08/2017   Procedure: Intravascular Pressure Wire/FFR Study;  Surgeon: Mady Bruckner, MD;  Location: [facility] INVASIVE CV LAB;  Service: Cardiovascular;  Laterality: N/A;    CORONARY STENT INTERVENTION N/A 03/26/2022   Procedure: CORONARY STENT INTERVENTION;  Surgeon: Court Dorn PARAS, MD;  Location: [facility] INVASIVE CV LAB;  Service: Cardiovascular;  Laterality: N/A;   EYE SURGERY Bilateral 2014   cataract   LEFT HEART CATH AND CORONARY ANGIOGRAPHY N/A 03/08/2017   Procedure: Left Heart Cath and Coronary Angiography;  Surgeon: Mady Bruckner, MD;  Location: [facility] INVASIVE CV LAB;  Service: Cardiovascular;  Laterality: N/A;   LEFT HEART CATH AND CORONARY ANGIOGRAPHY N/A 03/26/2022   Procedure: LEFT HEART CATH AND CORONARY ANGIOGRAPHY;  Surgeon: Court Dorn PARAS, MD;  Location: [facility] INVASIVE CV LAB;  Service: Cardiovascular;  Laterality: N/A;   LEFT HEART CATH AND CORONARY ANGIOGRAPHY N/A 10/05/2022   Procedure: LEFT HEART CATH AND CORONARY ANGIOGRAPHY;  Surgeon: Wonda Sharper, MD;  Location: [facility] INVASIVE CV LAB;  Service: Cardiovascular;  Laterality: N/A;   LEFT HEART CATH AND CORONARY ANGIOGRAPHY N/A 02/14/2023   Procedure: LEFT HEART CATH AND CORONARY ANGIOGRAPHY;  Surgeon: Jordan, Peter M, MD;  Location: [facility] INVASIVE CV LAB;  Service: Cardiovascular;  Laterality: N/A;   LEFT HEART CATHETERIZATION WITH CORONARY ANGIOGRAM N/A 01/07/2014   Procedure: LEFT HEART CATHETERIZATION WITH CORONARY ANGIOGRAM;  Surgeon: Ezra GORMAN Shuck, MD;  Location: [facility] CATH LAB;  Service: Cardiovascular;  Laterality: N/A;   TEE WITHOUT CARDIOVERSION N/A 02/19/2023   Procedure: TRANSESOPHAGEAL ECHOCARDIOGRAM;  Surgeon: Shyrl Linnie KIDD, MD;  Location: [facility] OR;  Service: Open Heart Surgery;  Laterality: N/A;      Social History:     Social History   Tobacco Use   Smoking status: Never   Smokeless tobacco: Never   Tobacco comments:    spouse, 41 years - husband has quit 01/2011  Substance Use Topics   Alcohol use: No       Family History :     Family History  Problem Relation Age of Onset   Heart attack Mother 57   Diabetes Mother    Hypertension Mother    Hyperlipidemia Mother     Hyperlipidemia Father    Hypertension Father    Heart attack Father 58   Diabetes Sister    Heart attack Brother 32       x 6   Heart disease Brother    Diabetes Brother    Colon  cancer Paternal Aunt        37s, died with brain anuerysm   GER disease Daughter    Cervical cancer Daughter    Diabetes Daughter    Crohn's disease Cousin        paternal      Home Medications:   Prior to Admission medications   Medication Sig Start Date End Date Taking? Authorizing Provider  acetaminophen  (TYLENOL ) 500 MG tablet Take 1,000 mg by mouth every 6 (six) hours as needed for mild pain (pain score 1-3) or headache.    [provider]  aspirin  81 MG chewable tablet Chew 1 tablet (81 mg total) by mouth daily. 03/28/22   Rai, Nydia POUR, MD  Blood Glucose Monitoring Suppl DEVI 1 each by Does not apply route in the morning, at noon, and at bedtime. May substitute to any manufacturer covered by patient's insurance. 06/19/23   [name], FNP  clopidogrel  (PLAVIX ) 75 MG tablet Take 1 tablet (75 mg total) by mouth daily with breakfast. 10/02/23   Pietro Redell RAMAN, MD  Continuous Glucose Sensor (FREESTYLE LIBRE 3 PLUS SENSOR) MISC Change sensor every 15 days. Apply to back of upper arm. DX: E11.65 02/12/24   [name], FNP  empagliflozin  (JARDIANCE ) 25 MG TABS tablet Take 1 tablet (25 mg total) by mouth daily before breakfast. 07/16/24   Nida, Gebreselassie W, MD  famotidine  (PEPCID ) 20 MG tablet Take 1 tablet (20 mg total) by mouth 2 (two) times daily. 02/28/24   [name], FNP  furosemide  (LASIX ) 20 MG tablet TAKE ONE (1) TABLET BY MOUTH EVERY DAY 05/06/24   [name], FNP  Homeopathic Products (MUSCLE CRAMP COMPLEX PO) Take 1 tablet by mouth as needed (muscle spasms).    [provider]  insulin  aspart protamine  - aspart (NOVOLOG  MIX 70/30 FLEXPEN) (70-30) 100 UNIT/ML FlexPen Inject 20 Units into the skin 2 (two) times daily with a meal. 07/16/24   Nida,  Gebreselassie W, MD  isosorbide  mononitrate (IMDUR ) 60 MG 24 hr tablet Take 1 tablet (60 mg total) by mouth daily. 03/25/24   Lavona Agent, MD  levETIRAcetam  (KEPPRA ) 500 MG tablet Take 1 tablet (500 mg total) by mouth 2 (two) times daily. 06/30/24 06/25/25  Camara, Amadou, MD  levocetirizine (XYZAL ) 5 MG tablet TAKE 1/2 TABLET BY MOUTH EVERY EVENING 06/29/24   [name], FNP  linagliptin  (TRADJENTA ) 5 MG TABS tablet Take 1 tablet (5 mg total) by mouth daily. 05/22/24   Ghimire, Donalda HERO, MD  lisinopril  (ZESTRIL ) 10 MG tablet Take 1 tablet (10 mg total) by mouth daily. 11/21/22   Lavona Agent, MD  loratadine  (CLARITIN ) 10 MG tablet Take 10 mg by mouth daily as needed for allergies.    [provider]  metoprolol  tartrate (LOPRESSOR ) 25 MG tablet Take 1 tablet (25 mg total) by mouth 2 (two) times daily. 05/08/24   [name], FNP  nitroGLYCERIN  (NITROSTAT ) 0.4 MG SL tablet DISSOLVE 1 TABLET UNDER TONGUE FOR CHESTPAIN.MAY REPEAT EVERY 5 MINUTES FOR 3 DOSES.IF NO RELIEF CALL 911 OR GO TO ER 03/09/24   [name], FNP  potassium chloride  (KLOR-CON  M) 10 MEQ tablet Take 2 tablets (20 mEq total) by mouth 2 (two) times daily. 03/02/24   [name], FNP  rOPINIRole  (REQUIP ) 1 MG tablet TAKE 1 TABLET BY MOUTH DAILY AS NEEDED 05/06/24   [name], FNP  rosuvastatin  (CRESTOR ) 20 MG tablet TAKE ONE (1) TABLET BY MOUTH EVERY DAY 02/10/24  [name], FNP  sertraline  (ZOLOFT ) 50 MG tablet Take 1.5 tablets (75 mg total) by mouth daily. 05/06/24   [name], FNP  traZODone  (DESYREL ) 50 MG tablet Take 1 tablet (50 mg total) by mouth at bedtime as needed for sleep. 05/06/24   [name], FNP     Allergies:     Allergies  Allergen Reactions   Iohexol  Anaphylaxis and Nausea Only    Pt had syncopal episode with nausea post IV CM late 1990's,  pt has had prednisone  prep with heart caths x 2 without problem  kdean 04/16/07, Onset Date: 04-15-1996   Codeine  Nausea And Vomiting and Palpitations   Metformin  And Related Diarrhea   Ticlid [Ticlopidine Hcl] Nausea And Vomiting   Jardiance  [Empagliflozin ] Other (See Comments)    Recurrent UTIs     Physical Exam:   Vitals  Blood pressure (!) 147/68, pulse 72, temperature 98.4 F (36.9 C), temperature source Oral, resp. rate 13, height 5' 6 (1.676 m), weight 72.6 kg, SpO2 98%.   1. General Developed female, laying in bed, no apparent distress  2. Normal affect and insight, Not Suicidal or Homicidal, Awake Alert, Oriented X 3.  3. No F.N deficits, ALL C.Nerves Intact, Strength 5/5 all 4 extremities, Sensation intact all 4 extremities, Plantars down going.  4. Ears and Eyes appear Normal, Conjunctivae clear, PERRLA. Moist Oral Mucosa.  Horizontal nystagmus to the left  5. Supple Neck, No JVD, No cervical lymphadenopathy appriciated, No Carotid Bruits.  6. Symmetrical Chest wall movement, Good air movement bilaterally, CTAB.  7. RRR, No Gallops, Rubs or Murmurs, No Parasternal Heave.  8. Positive Bowel Sounds, Abdomen Soft, No tenderness, No organomegaly appriciated,No rebound -guarding or rigidity.  9.  No Cyanosis, Normal Skin Turgor, No Skin Rash or Bruise.  10. Good muscle tone,  joints appear normal , no effusions, Normal ROM.     Data Review:    CBC Recent Labs  Lab 07/18/24 2025  WBC 12.0*  HGB 10.9*  HCT 33.0*  PLT 162  MCV 89.4  MCH 29.5  MCHC 33.0  RDW 13.4  LYMPHSABS 5.1*  MONOABS 1.0  EOSABS 0.1  BASOSABS 0.0   ------------------------------------------------------------------------------------------------------------------  Chemistries  Recent Labs  Lab 07/18/24 1940  NA 140  K 4.6  CL 105  CO2 25  GLUCOSE 66*  BUN 14  CREATININE 1.37*  CALCIUM  9.4  AST 15  ALT 8  ALKPHOS 82  BILITOT 0.4   ------------------------------------------------------------------------------------------------------------------ estimated creatinine clearance is 36.2  mL/min (A) (by C-G formula based on SCr of 1.37 mg/dL (H)). ------------------------------------------------------------------------------------------------------------------ No results for input(s): TSH, T4TOTAL, T3FREE, THYROIDAB in the last 72 hours.  Invalid input(s): FREET3  Coagulation profile No results for input(s): INR, PROTIME in the last 168 hours. ------------------------------------------------------------------------------------------------------------------- No results for input(s): DDIMER in the last 72 hours. -------------------------------------------------------------------------------------------------------------------  Cardiac Enzymes No results for input(s): CKMB, TROPONINI, MYOGLOBIN in the last 168 hours.  Invalid input(s): CK ------------------------------------------------------------------------------------------------------------------    Component Value Date/Time   BNP 70.0 10/10/2020 2349     ---------------------------------------------------------------------------------------------------------------  Urinalysis    Component Value Date/Time   COLORURINE STRAW (A) 05/18/2024 1116   APPEARANCEUR CLEAR 05/18/2024 1116   APPEARANCEUR Clear 05/06/2024 1600   LABSPEC 1.008 05/18/2024 1116   PHURINE 7.0 05/18/2024 1116   GLUCOSEU >=500 (A) 05/18/2024 1116   HGBUR MODERATE (A) 05/18/2024 1116   BILIRUBINUR NEGATIVE 05/18/2024 1116   BILIRUBINUR Negative 05/06/2024 1600   KETONESUR NEGATIVE 05/18/2024 1116   PROTEINUR NEGATIVE  05/18/2024 1116   UROBILINOGEN negative 10/11/2014 1109   UROBILINOGEN 2.0 (H) 08/14/2014 1157   NITRITE NEGATIVE 05/18/2024 1116   LEUKOCYTESUR NEGATIVE 05/18/2024 1116    ----------------------------------------------------------------------------------------------------------------   Imaging Results:    CT Head Wo Contrast Result Date: 07/18/2024 EXAM: CT HEAD AND CERVICAL SPINE 07/18/2024  07:55:03 PM TECHNIQUE: CT of the head and cervical spine was performed without the administration of intravenous contrast. Multiplanar reformatted images are provided for review. Automated exposure control, iterative reconstruction, and/or weight based adjustment of the mA/kV was utilized to reduce the radiation dose to as low as reasonably achievable. COMPARISON: CT head and CT cervical spine 01/08/2024 CLINICAL HISTORY: Polytrauma, blunt. general weakness. Pt has equal grips on both sides per EMS. 3 falls today and has been weak since she has gotten up this morning. Hx of diabetes. FINDINGS: CT HEAD BRAIN AND VENTRICLES: No acute intracranial hemorrhage. No mass effect or midline shift. No abnormal extra-axial fluid collection. No evidence of acute infarct. No hydrocephalus. ORBITS: No acute abnormality. SINUSES AND MASTOIDS: No acute abnormality. SOFT TISSUES AND SKULL: No acute skull fracture. No acute soft tissue abnormality. CT CERVICAL SPINE BONES AND ALIGNMENT: No acute fracture or traumatic malalignment. DEGENERATIVE CHANGES: Multilevel central percutaneous hypertrophy including likely moderate left foraminal stenosis at multiple levels including C5 C6 and C6-C7. At least moderate canal stenosis at C5-C6 SOFT TISSUES: No prevertebral soft tissue swelling. IMPRESSION: 1. No acute intracranial abnormality. 2. No acute fracture or traumatic malalignment of the cervical spine. 3. 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 MRI could further characterize if clinically warranted. Electronically signed by: Gilmore Molt MD 07/18/2024 08:25 PM EDT RP Workstation: [id number]   CT Cervical Spine Wo Contrast Result Date: 07/18/2024 EXAM: CT HEAD AND CERVICAL SPINE 07/18/2024 07:55:03 PM TECHNIQUE: CT of the head and cervical spine was performed without the administration of intravenous contrast. Multiplanar  reformatted images are provided for review. Automated exposure control, iterative reconstruction, and/or weight based adjustment of the mA/kV was utilized to reduce the radiation dose to as low as reasonably achievable. COMPARISON: CT head and CT cervical spine 01/08/2024 CLINICAL HISTORY: Polytrauma, blunt. general weakness. Pt has equal grips on both sides per EMS. 3 falls today and has been weak since she has gotten up this morning. Hx of diabetes. FINDINGS: CT HEAD BRAIN AND VENTRICLES: No acute intracranial hemorrhage. No mass effect or midline shift. No abnormal extra-axial fluid collection. No evidence of acute infarct. No hydrocephalus. ORBITS: No acute abnormality. SINUSES AND MASTOIDS: No acute abnormality. SOFT TISSUES AND SKULL: No acute skull fracture. No acute soft tissue abnormality. CT CERVICAL SPINE BONES AND ALIGNMENT: No acute fracture or traumatic malalignment. DEGENERATIVE CHANGES: Multilevel central percutaneous hypertrophy including likely moderate left foraminal stenosis at multiple levels including C5 C6 and C6-C7. At least moderate canal stenosis at C5-C6 SOFT TISSUES: No prevertebral soft tissue swelling. IMPRESSION: 1. No acute intracranial abnormality. 2. No acute fracture or traumatic malalignment of the cervical spine. 3. 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 MRI could further characterize if clinically warranted. Electronically signed by: Gilmore Molt MD 07/18/2024 08:25 PM EDT RP Workstation: [id number]    EKG:   Vent. rate 68 BPM PR interval 202 ms QRS duration 109 ms QT/QTcB 413/440 ms P-R-T axes 0 -26 28 Sinus rhythm Ventricular trigeminy Abnormal R-wave progression, late transition   Assessment &  Plan:    Principal Problem:   Hypoglycemia Active Problems:   Mixed hyperlipidemia   Vertigo   Stage 3b chronic kidney disease (HCC)   Type 2 diabetes  mellitus with stage 3a chronic kidney disease, with long-term current use of insulin  (HCC)   Seizures (HCC)   Insulin  long-term use (HCC)  Falls Benign positional vertigo - Presents with multiple falls, descriptions fits benign positional vertigo - CT head, CTA head and neck with no acute findings. - Will consult tubular PT/OT -keep on scheduled meclizine   Brittle diabetes mellitus, with hypoglycemia and hyperglycemia- -recent hospitalization for HHS - He was on Tresiba , reports taking 30 units at bedtime and insulin  sliding scale daytime, apparently she was seen by her endocrinologist recently and instructed to do changes in her insulin  regimen, unclear dose recommendations yet as unclear on the follow-up note.   - Given her brittle diabetes high well continue with Tresiba  but lower the dose to 25 units, to start daily from tomorrow, and continue with insulin  sliding scale -Most recent A1c is 8.5 June 08, 2024  Leukocytosis -nontoxic appearing, chest x-ray with no acute findings, following UA    HTN Continue with home regimen including lisinopril , metoprolol  and Imdur    HLD Continue with home statin  History of possible seizures -Recent hospitalization with concern for seizures, started on Keppra , will continue   History of B12 deficiency -Most recent level within normal limit, continue with IM supplements   CAD s/p CABG May 2024 Continue with aspirin , Plavix , beta-blockers and statin   CKD stage IIIa Creatinine at baseline baseline   Depression Continue with home meds     DVT Prophylaxis Heparin    AM Labs Ordered, also please review Full Orders  Family Communication: Admission, patients condition and plan of care including tests being ordered have been discussed with the patient who indicate understanding and agree with the plan and Code Status.  Code Status Full  Likely DC to home  Consults called: None  Admission status: Observation  Time spent in  minutes : 70 minutes   Brayton Lye M.D on 07/18/2024 at 9:28 PM   [facility] - Office  [phone]

## 2024-07-18 NOTE — ED Provider Notes (Signed)
 [place] EMERGENCY DEPARTMENT AT [facility] Provider Note   CSN: [id number] Arrival date & time: 07/18/24  1820     Patient presents with: Weakness   [name] is a 73 y.o. female.    Weakness   Patient presents because of weakness as well as multiple falls.  Patient states that she woke up this morning and sat up and subsequently felt very vertiginous.  Fell against a nightstand.  She subsequently had 3 further falls today because of vertigo.  Patient states that she is asymptomatic when at rest but whenever she stands up she subsequently becomes very vertiginous and will lose her balance.  No diplopia.  No vision field cuts.  No numbness or tingling aware.  No neck pain or back pain.  No bowel or bladder incontinence.  No chest pain or shortness of breath.  No nausea vomit diarrhea.  Patient states that she has been diagnosed with vertigo in the past but it is been a long time ago.  Patient states that she has not experienced any kind of focal weakness or sensory changes that she is aware of.  Previous medical history reviewed : Patient last admitted in August 2025.history of CAD s/p CABG May 2024, DM-2, HLD, HTN.  Patient was admitted at that time because of altered mental status in the setting of hyperglycemic hyperosmolar nonketotic state.     Prior to Admission medications   Medication Sig Start Date End Date Taking? Authorizing Provider  acetaminophen  (TYLENOL ) 500 MG tablet Take 1,000 mg by mouth every 6 (six) hours as needed for mild pain (pain score 1-3) or headache.    [provider]  aspirin  81 MG chewable tablet Chew 1 tablet (81 mg total) by mouth daily. 03/28/22   Rai, Nydia POUR, MD  Blood Glucose Monitoring Suppl DEVI 1 each by Does not apply route in the morning, at noon, and at bedtime. May substitute to any manufacturer covered by patient's insurance. 06/19/23   [name], FNP  clopidogrel  (PLAVIX ) 75 MG tablet Take 1 tablet (75 mg  total) by mouth daily with breakfast. 10/02/23   Pietro Redell RAMAN, MD  Continuous Glucose Sensor (FREESTYLE LIBRE 3 PLUS SENSOR) MISC Change sensor every 15 days. Apply to back of upper arm. DX: E11.65 02/12/24   [name], FNP  empagliflozin  (JARDIANCE ) 25 MG TABS tablet Take 1 tablet (25 mg total) by mouth daily before breakfast. 07/16/24   Nida, Gebreselassie W, MD  famotidine  (PEPCID ) 20 MG tablet Take 1 tablet (20 mg total) by mouth 2 (two) times daily. 02/28/24   [name], FNP  furosemide  (LASIX ) 20 MG tablet TAKE ONE (1) TABLET BY MOUTH EVERY DAY 05/06/24   [name], FNP  Homeopathic Products (MUSCLE CRAMP COMPLEX PO) Take 1 tablet by mouth as needed (muscle spasms).    [provider]  insulin  aspart protamine  - aspart (NOVOLOG  MIX 70/30 FLEXPEN) (70-30) 100 UNIT/ML FlexPen Inject 20 Units into the skin 2 (two) times daily with a meal. 07/16/24   Nida, Gebreselassie W, MD  isosorbide  mononitrate (IMDUR ) 60 MG 24 hr tablet Take 1 tablet (60 mg total) by mouth daily. 03/25/24   Lavona Agent, MD  levETIRAcetam  (KEPPRA ) 500 MG tablet Take 1 tablet (500 mg total) by mouth 2 (two) times daily. 06/30/24 06/25/25  Camara, Amadou, MD  levocetirizine (XYZAL ) 5 MG tablet TAKE 1/2 TABLET BY MOUTH EVERY EVENING 06/29/24   [name], FNP  linagliptin  (TRADJENTA ) 5 MG  TABS tablet Take 1 tablet (5 mg total) by mouth daily. 05/22/24   Ghimire, Donalda HERO, MD  lisinopril  (ZESTRIL ) 10 MG tablet Take 1 tablet (10 mg total) by mouth daily. 11/21/22   Lavona Agent, MD  loratadine  (CLARITIN ) 10 MG tablet Take 10 mg by mouth daily as needed for allergies.    [provider]  metoprolol  tartrate (LOPRESSOR ) 25 MG tablet Take 1 tablet (25 mg total) by mouth 2 (two) times daily. 05/08/24   [name], FNP  nitroGLYCERIN  (NITROSTAT ) 0.4 MG SL tablet DISSOLVE 1 TABLET UNDER TONGUE FOR CHESTPAIN.MAY REPEAT EVERY 5 MINUTES FOR 3 DOSES.IF NO RELIEF CALL 911 OR GO TO ER  03/09/24   [name], FNP  potassium chloride  (KLOR-CON  M) 10 MEQ tablet Take 2 tablets (20 mEq total) by mouth 2 (two) times daily. 03/02/24   [name], FNP  rOPINIRole  (REQUIP ) 1 MG tablet TAKE 1 TABLET BY MOUTH DAILY AS NEEDED 05/06/24   [name], FNP  rosuvastatin  (CRESTOR ) 20 MG tablet TAKE ONE (1) TABLET BY MOUTH EVERY DAY 02/10/24   [name], FNP  sertraline  (ZOLOFT ) 50 MG tablet Take 1.5 tablets (75 mg total) by mouth daily. 05/06/24   [name], FNP  traZODone  (DESYREL ) 50 MG tablet Take 1 tablet (50 mg total) by mouth at bedtime as needed for sleep. 05/06/24   [name], FNP    Allergies: Iohexol , Codeine, Metformin  and related, Ticlid [ticlopidine hcl], and Jardiance  [empagliflozin ]    Review of Systems  Neurological:  Positive for weakness.    Updated Vital Signs BP (!) 159/76 (BP Location: Left Arm)   Pulse 78   Temp 97.7 F (36.5 C) (Oral)   Resp 15   Ht 5' 6 (1.676 m)   Wt 73.9 kg   SpO2 99%   BMI 26.30 kg/m   Physical Exam Vitals and nursing note reviewed.  Constitutional:      General: She is not in acute distress.    Appearance: She is well-developed.  HENT:     Head: Normocephalic and atraumatic.  Eyes:     General: No visual field deficit.    Conjunctiva/sclera: Conjunctivae normal.  Cardiovascular:     Rate and Rhythm: Normal rate and regular rhythm.     Heart sounds: No murmur heard. Pulmonary:     Effort: Pulmonary effort is normal. No respiratory distress.     Breath sounds: Normal breath sounds.  Abdominal:     Palpations: Abdomen is soft.     Tenderness: There is no abdominal tenderness.  Musculoskeletal:        General: No swelling.     Cervical back: Neck supple.  Skin:    General: Skin is warm and dry.     Capillary Refill: Capillary refill takes less than 2 seconds.  Neurological:     Mental Status: She is alert and oriented to person, place, and time.     GCS: GCS eye subscore is 4. GCS  verbal subscore is 5. GCS motor subscore is 6.     Cranial Nerves: Cranial nerves 2-12 are intact. No cranial nerve deficit, dysarthria or facial asymmetry.     Sensory: Sensation is intact. No sensory deficit.     Motor: Motor function is intact. No weakness or pronator drift.     Coordination: Coordination is intact. Finger-Nose-Finger Test normal.  Psychiatric:        Mood and Affect: Mood normal.     (all labs ordered  are listed, but only abnormal results are displayed) Labs Reviewed  COMPREHENSIVE METABOLIC PANEL WITH GFR - Abnormal; Notable for the following components:      Result Value   Glucose, Bld 66 (*)    Creatinine, Ser 1.37 (*)    GFR, Estimated 40 (*)    All other components within normal limits  URINALYSIS, ROUTINE W REFLEX MICROSCOPIC - Abnormal; Notable for the following components:   Color, Urine STRAW (*)    Glucose, UA >=500 (*)    Hgb urine dipstick SMALL (*)    All other components within normal limits  CBC WITH DIFFERENTIAL/PLATELET - Abnormal; Notable for the following components:   WBC 12.0 (*)    RBC 3.69 (*)    Hemoglobin 10.9 (*)    HCT 33.0 (*)    Lymphs Abs 5.1 (*)    All other components within normal limits  GLUCOSE, CAPILLARY - Abnormal; Notable for the following components:   Glucose-Capillary 135 (*)    All other components within normal limits  CBG MONITORING, ED - Abnormal; Notable for the following components:   Glucose-Capillary 62 (*)    All other components within normal limits  CBG MONITORING, ED - Abnormal; Notable for the following components:   Glucose-Capillary 110 (*)    All other components within normal limits  CBC WITH DIFFERENTIAL/PLATELET  BASIC METABOLIC PANEL WITH GFR  CBC  HEMOGLOBIN A1C  CBG MONITORING, ED    EKG: None  Radiology: CT Head Wo Contrast Result Date: 07/18/2024 EXAM: CT HEAD AND CERVICAL SPINE 07/18/2024 07:55:03 PM TECHNIQUE: CT of the head and cervical spine was performed without the  administration of intravenous contrast. Multiplanar reformatted images are provided for review. Automated exposure control, iterative reconstruction, and/or weight based adjustment of the mA/kV was utilized to reduce the radiation dose to as low as reasonably achievable. COMPARISON: CT head and CT cervical spine 01/08/2024 CLINICAL HISTORY: Polytrauma, blunt. general weakness. Pt has equal grips on both sides per EMS. 3 falls today and has been weak since she has gotten up this morning. Hx of diabetes. FINDINGS: CT HEAD BRAIN AND VENTRICLES: No acute intracranial hemorrhage. No mass effect or midline shift. No abnormal extra-axial fluid collection. No evidence of acute infarct. No hydrocephalus. ORBITS: No acute abnormality. SINUSES AND MASTOIDS: No acute abnormality. SOFT TISSUES AND SKULL: No acute skull fracture. No acute soft tissue abnormality. CT CERVICAL SPINE BONES AND ALIGNMENT: No acute fracture or traumatic malalignment. DEGENERATIVE CHANGES: Multilevel central percutaneous hypertrophy including likely moderate left foraminal stenosis at multiple levels including C5 C6 and C6-C7. At least moderate canal stenosis at C5-C6 SOFT TISSUES: No prevertebral soft tissue swelling. IMPRESSION: 1. No acute intracranial abnormality. 2. No acute fracture or traumatic malalignment of the cervical spine. 3. 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 MRI could further characterize if clinically warranted. Electronically signed by: Gilmore Molt MD 07/18/2024 08:25 PM EDT RP Workstation: [id number]   CT Cervical Spine Wo Contrast Result Date: 07/18/2024 EXAM: CT HEAD AND CERVICAL SPINE 07/18/2024 07:55:03 PM TECHNIQUE: CT of the head and cervical spine was performed without the administration of intravenous contrast. Multiplanar reformatted images are provided for review. Automated exposure control, iterative  reconstruction, and/or weight based adjustment of the mA/kV was utilized to reduce the radiation dose to as low as reasonably achievable. COMPARISON: CT head and CT cervical spine 01/08/2024 CLINICAL HISTORY: Polytrauma, blunt. general weakness. Pt has equal grips on both  sides per EMS. 3 falls today and has been weak since she has gotten up this morning. Hx of diabetes. FINDINGS: CT HEAD BRAIN AND VENTRICLES: No acute intracranial hemorrhage. No mass effect or midline shift. No abnormal extra-axial fluid collection. No evidence of acute infarct. No hydrocephalus. ORBITS: No acute abnormality. SINUSES AND MASTOIDS: No acute abnormality. SOFT TISSUES AND SKULL: No acute skull fracture. No acute soft tissue abnormality. CT CERVICAL SPINE BONES AND ALIGNMENT: No acute fracture or traumatic malalignment. DEGENERATIVE CHANGES: Multilevel central percutaneous hypertrophy including likely moderate left foraminal stenosis at multiple levels including C5 C6 and C6-C7. At least moderate canal stenosis at C5-C6 SOFT TISSUES: No prevertebral soft tissue swelling. IMPRESSION: 1. No acute intracranial abnormality. 2. No acute fracture or traumatic malalignment of the cervical spine. 3. 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 MRI could further characterize if clinically warranted. Electronically signed by: Gilmore Molt MD 07/18/2024 08:25 PM EDT RP Workstation: [id number]     Procedures   Medications Ordered in the ED  heparin  injection 5,000 Units (has no administration in time range)  acetaminophen  (TYLENOL ) tablet 650 mg (650 mg Oral Given 07/18/24 2244)    Or  acetaminophen  (TYLENOL ) suppository 650 mg ( Rectal See Alternative 07/18/24 2244)  hydrALAZINE  (APRESOLINE ) injection 5 mg (has no administration in time range)  lactated ringers  infusion ( Intravenous New Bag/Given 07/18/24 2218)  meclizine   (ANTIVERT ) tablet 25 mg (25 mg Oral Given 07/18/24 2244)  aspirin  chewable tablet 81 mg (has no administration in time range)  isosorbide  mononitrate (IMDUR ) 24 hr tablet 60 mg (has no administration in time range)  lisinopril  (ZESTRIL ) tablet 10 mg (has no administration in time range)  metoprolol  tartrate (LOPRESSOR ) tablet 25 mg (25 mg Oral Given 07/18/24 2244)  nitroGLYCERIN  (NITROSTAT ) SL tablet 0.4 mg (has no administration in time range)  rosuvastatin  (CRESTOR ) tablet 20 mg (has no administration in time range)  sertraline  (ZOLOFT ) tablet 75 mg (has no administration in time range)  traZODone  (DESYREL ) tablet 50 mg (50 mg Oral Given 07/18/24 2245)  clopidogrel  (PLAVIX ) tablet 75 mg (has no administration in time range)  cyanocobalamin  (VITAMIN B12) injection 1,000 mcg (has no administration in time range)  levETIRAcetam  (KEPPRA ) tablet 500 mg (500 mg Oral Given 07/18/24 2245)  pantoprazole  (PROTONIX ) EC tablet 40 mg (40 mg Oral Given 07/18/24 2245)  insulin  aspart (novoLOG ) injection 0-9 Units (has no administration in time range)  insulin  aspart (novoLOG ) injection 0-5 Units ( Subcutaneous Not Given 07/18/24 2230)  insulin  glargine-yfgn (SEMGLEE ) injection 25 Units (has no administration in time range)  sodium chloride  0.9 % bolus 500 mL (0 mLs Intravenous Stopped 07/18/24 2100)                      NIH Stroke Scale: 0              Medical Decision Making Amount and/or Complexity of Data Reviewed Labs: ordered. Radiology: ordered.  Risk Decision regarding hospitalization.     HPI:   Patient presents because of weakness as well as multiple falls.  Patient states that she woke up this morning and sat up and subsequently felt very vertiginous.  Fell against a nightstand.  She subsequently had 3 further falls today because of vertigo.  Patient states that she is asymptomatic when at rest but whenever she stands up she subsequently becomes very vertiginous and will lose her  balance.  No diplopia.  No vision field cuts.  No numbness or tingling aware.  No neck pain or back pain.  No bowel or bladder incontinence.  No chest pain or shortness of breath.  No nausea vomit diarrhea.  Patient states that she has been diagnosed with vertigo in the past but it is been a long time ago.  Patient states that she has not experienced any kind of focal weakness or sensory changes that she is aware of.  Previous medical history reviewed : Patient last admitted in August 2025.history of CAD s/p CABG May 2024, DM-2, HLD, HTN.  Patient was admitted at that time because of altered mental status in the setting of hyperglycemic hyperosmolar nonketotic state.   MDM:    Upon exam, patient ANO x 3 with GCS 15.  No focal deficit.  Cranials 2 through 12 intact.  NIH is 0.  Normal finger-nose.  Strength and sensation intact in bilateral upper and lower extremities.  Will obtain laboratory workup to make sure there is no large electrolyte derangements.  Eval for AKI.  Given patient's multiple falls, will obtain CT head and CT cervical spine.  Reevaluation:   Upon reexamination, patient hemodynamically stable.  Remains A&O x 3 with GCS 15.  CT head and CT cervical spine benign.  No acute pathology seen.   Patient did drop her glucose here.  Initially glucose was in the 300 range for EMS.  Glucose dropped to 66 here.  Patient received p.o. intervention and subsequent glucose improved to 110.  Patient is on insulin .  Sounds like her glucose has been fluctuating a lot at home.  Question whether or not this could be contributing to patient's episodes of falls.  Therefore, we will place patient in observation for glucose monitoring and medication titration.  Still have low concerns for any kind of CVA at this time.  Once again, patient does have some horizontal nystagmus.  Otherwise, neurologic exam benign with normal finger-nose.  Patient able to walk.  Do not think she would benefit from MRI  brain at this time but inpatient team can reassess if symptoms continue.   EKG Interpreted by Me: NSR   Cardiac Tele Interpreted by Me: NSR   I have independently interpreted  CT  images and agree with the radiologist finding      Disposition and Follow Up: admit       Final diagnoses:  Weakness  Hypoglycemia  Falls  Vertigo    ED Discharge Orders     None          Simon Lavonia SAILOR, MD 07/18/24 2247

## 2024-07-19 DIAGNOSIS — R569 Unspecified convulsions: Secondary | ICD-10-CM | POA: Diagnosis not present

## 2024-07-19 DIAGNOSIS — N1831 Chronic kidney disease, stage 3a: Secondary | ICD-10-CM

## 2024-07-19 DIAGNOSIS — I2510 Atherosclerotic heart disease of native coronary artery without angina pectoris: Secondary | ICD-10-CM | POA: Diagnosis not present

## 2024-07-19 DIAGNOSIS — R42 Dizziness and giddiness: Secondary | ICD-10-CM

## 2024-07-19 DIAGNOSIS — E1169 Type 2 diabetes mellitus with other specified complication: Secondary | ICD-10-CM | POA: Diagnosis not present

## 2024-07-19 DIAGNOSIS — I10 Essential (primary) hypertension: Secondary | ICD-10-CM | POA: Insufficient documentation

## 2024-07-19 DIAGNOSIS — F32A Depression, unspecified: Secondary | ICD-10-CM

## 2024-07-19 DIAGNOSIS — E538 Deficiency of other specified B group vitamins: Secondary | ICD-10-CM

## 2024-07-19 DIAGNOSIS — E785 Hyperlipidemia, unspecified: Secondary | ICD-10-CM

## 2024-07-19 LAB — BASIC METABOLIC PANEL WITH GFR
Anion gap: 9 (ref 5–15)
BUN: 13 mg/dL (ref 8–23)
CO2: 24 mmol/L (ref 22–32)
Calcium: 8.9 mg/dL (ref 8.9–10.3)
Chloride: 107 mmol/L (ref 98–111)
Creatinine, Ser: 1.18 mg/dL — ABNORMAL HIGH (ref 0.44–1.00)
GFR, Estimated: 48 mL/min — ABNORMAL LOW (ref >60)
Glucose, Bld: 151 mg/dL — ABNORMAL HIGH (ref 70–99)
Potassium: 4.5 mmol/L (ref 3.5–5.1)
Sodium: 140 mmol/L (ref 135–145)

## 2024-07-19 LAB — CBC
HCT: 35 % — ABNORMAL LOW (ref 36.0–46.0)
Hemoglobin: 11.3 g/dL — ABNORMAL LOW (ref 12.0–15.0)
MCH: 28.9 pg (ref 26.0–34.0)
MCHC: 32.3 g/dL (ref 30.0–36.0)
MCV: 89.5 fL (ref 80.0–100.0)
Platelets: 129 K/uL — ABNORMAL LOW (ref 150–400)
RBC: 3.91 MIL/uL (ref 3.87–5.11)
RDW: 13.3 % (ref 11.5–15.5)
WBC: 7.1 K/uL (ref 4.0–10.5)
nRBC: 0 % (ref 0.0–0.2)

## 2024-07-19 LAB — HEMOGLOBIN A1C
Hgb A1c MFr Bld: 9.4 % — ABNORMAL HIGH (ref 4.8–5.6)
Mean Plasma Glucose: 223.08 mg/dL

## 2024-07-19 LAB — GLUCOSE, CAPILLARY
Glucose-Capillary: 157 mg/dL — ABNORMAL HIGH (ref 70–99)
Glucose-Capillary: 230 mg/dL — ABNORMAL HIGH (ref 70–99)

## 2024-07-19 MED ORDER — MECLIZINE HCL 25 MG PO TABS: 25.0000 mg | ORAL_TABLET | Freq: Three times a day (TID) | ORAL | 0 refills | Status: AC | PRN

## 2024-07-19 NOTE — Evaluation (Addendum)
 Physical Therapy Evaluation Patient Details Name: [name] MRN: [id number] DOB: [birth year] Today's Date: 07/19/2024  History of Present Illness  [name]  is a 73 y.o. 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  Will multiple hospitalization for seizure-like activities with concern for seizures, she was started on Keppra .  - Presents to ED today due to multiple events of dizziness, vertigo and fall, patient reports that this resembles her previous vertigo in the past, she had a fall against nightstand, he had another 3 falls after that, she reports feeling room spinning, vertigo, loss of balance, she denies any focal deficits, loss of consciousness, diplopia or vision field cuts, no tingling or numbness.  Was found to be hyperglycemic by EMS with blood glucose in the 300 range.  - In the ED workup significant for blood glucose at 66, confirmed by CBG of 62, creatinine at baseline of 1.37, white blood cell count elevated at 12, UA still pending, chest x-ray with no acute findings, CT head and cervical spine with no acute findings, [facility] hospitalist consulted to admit.   Clinical Impression  Patient agreeable to PT evaluation. Patient reports at baseline, she ambulates in home and community with rollator and is independent with ADLs. Reports having very supportive neighbor who sits with her throughout the day and children who assist with transportation. On this date, pt required min assist with bed mobility, CGA during ambulation due to mild unsteadiness, and modified independent with toilet transfer where she handles pericare independently in sitting. Pt reports no dizziness throughout entirety of session. Dix hallpike testing is negative, most likely due to meclizine  as it masks symptoms. Orthostatics taken  in session as well: supine BP-161/65, sitting 173/66, standing 144/70. Educated pt on importance of having someone home with her initially once discharged, no driving, and consistent use of AD as well as compliance with meds. Pt reports understanding. Pt left in recliner, call button within reach, and nursing staff aware. Patient will benefit from continued skilled physical therapy in recommended setting in order to address deficits listed under PT problem list and ensure safety once home. Patient discharged to care of nursing for ambulation daily as tolerated for length of stay.        If plan is discharge home, recommend the following: Assist for transportation;A little help with walking and/or transfers   Can travel by private vehicle        Equipment Recommendations None recommended by PT  Recommendations for Other Services       Functional Status Assessment Patient has had a recent decline in their functional status and demonstrates the ability to make significant improvements in function in a reasonable and predictable amount of time.     Precautions / Restrictions Precautions Precautions: Fall Recall of Precautions/Restrictions: Intact Restrictions Weight Bearing Restrictions Per Provider Order: No      Mobility  Bed Mobility Overal bed mobility: Needs Assistance Bed Mobility: Supine to Sit     Supine to sit: Min assist     General bed mobility comments: HOB flat, pt reporting she installed bed railings in her bed. min A due to difficulty utilizing bed railing as bed is padded secondary to history of seizures. min assist for trunk handling    Transfers Overall transfer level: Needs assistance Equipment used: Rolling walker (2 wheels)  Transfers: Sit to/from Stand, Bed to chair/wheelchair/BSC Sit to Stand: Contact guard assist   Step pivot transfers: Contact guard assist       General transfer comment: CGA assist for safety as pt demo slow labored movement and a  couple instances of mild unsteadiness.    Ambulation/Gait Ambulation/Gait assistance: Contact guard assist Gait Distance (Feet): 20 Feet Assistive device: Rolling walker (2 wheels) Gait Pattern/deviations: Step-through pattern, Decreased step length - right, Decreased step length - left, Decreased stride length Gait velocity: Dec     General Gait Details: pt ambulates in room to/from bathroom with RW and CGA, mild unsteadiness of note. pt reports no dizziness throughout  Stairs            Wheelchair Mobility     Tilt Bed    Modified Rankin (Stroke Patients Only)       Balance Overall balance assessment: Needs assistance Sitting-balance support: Feet supported, No upper extremity supported Sitting balance-Leahy Scale: Fair Sitting balance - Comments: Seated EOB, Posterior lean at times, corrects with verbal cueing   Standing balance support: During functional activity, Bilateral upper extremity supported Standing balance-Leahy Scale: Fair Standing balance comment: w/ RW           Pertinent Vitals/Pain Pain Assessment Pain Assessment: No/denies pain    Home Living Family/patient expects to be discharged to:: Private residence Living Arrangements: Alone Available Help at Discharge: Family;Friend(s);Available PRN/intermittently Type of Home: Apartment Home Access: Elevator       Home Layout: One level Home Equipment: Rollator (4 wheels);Cane - [employer] (2 wheels);Wheelchair - manual;Tub bench;Grab bars - toilet;Grab bars - tub/shower;BSC/3in1;Other (comment) Additional Comments: Pt reports having 4 children who help her with transportation and a next door neighbor who comes and sits with her throughout the day.    Prior Function Prior Level of Function : Independent/Modified Independent             Mobility Comments: reports she now uses rollator in and out of home, does not drive- primary MD told her not to drive for 6 months ADLs Comments:  independent with ADLs     Extremity/Trunk Assessment   Upper Extremity Assessment Upper Extremity Assessment: Defer to OT evaluation    Lower Extremity Assessment Lower Extremity Assessment: Generalized weakness;Overall WFL for tasks assessed (pt generally weak throughout but adequate strength for functional tasks.)    Cervical / Trunk Assessment Cervical / Trunk Assessment: Kyphotic  Communication   Communication Communication: No apparent difficulties    Cognition Arousal: Alert Behavior During Therapy: WFL for tasks assessed/performed             Following commands: Intact       Cueing Cueing Techniques: Verbal cues, Tactile cues, Visual cues     General Comments      Exercises     Assessment/Plan    PT Assessment All further PT needs can be met in the next venue of care  PT Problem List Decreased strength;Decreased activity tolerance;Decreased balance;Decreased mobility       PT Treatment Interventions      PT Goals (Current goals can be found in the Care Plan section)  Acute Rehab PT Goals Patient Stated Goal: Return home with HHPT PT Goal Formulation: With patient Time For Goal Achievement: 07/24/24 Potential to Achieve Goals: Good    Frequency       Co-evaluation               AM-PAC PT 6 Clicks Mobility  Outcome Measure Help needed  turning from your back to your side while in a flat bed without using bedrails?: None Help needed moving from lying on your back to sitting on the side of a flat bed without using bedrails?: A Little Help needed moving to and from a bed to a chair (including a wheelchair)?: A Little Help needed standing up from a chair using your arms (e.g., wheelchair or bedside chair)?: None Help needed to walk in hospital room?: A Little Help needed climbing 3-5 steps with a railing? : A Lot 6 Click Score: 19    End of Session Equipment Utilized During Treatment: Gait belt Activity Tolerance: Patient tolerated  treatment well Patient left: in chair;with call bell/phone within reach Nurse Communication: Mobility status PT Visit Diagnosis: Unsteadiness on feet (R26.81);Other abnormalities of gait and mobility (R26.89);History of falling (Z91.81);Muscle weakness (generalized) (M62.81)    Time: 9165-9096 PT Time Calculation (min) (ACUTE ONLY): 29 min   Charges:   PT Evaluation $PT Eval Moderate Complexity: 1 Mod   PT General Charges $$ ACUTE PT VISIT: 1 Visit         10:49 AM, 07/19/24 Rosaria Settler, PT, DPT [place] [facility]

## 2024-07-19 NOTE — Assessment & Plan Note (Signed)
 Continue blood pressure control with isosorbide , lisinopril  and metoprolol  Hold on loop diuretic for now.

## 2024-07-19 NOTE — Care Management Obs Status (Signed)
 MEDICARE OBSERVATION STATUS NOTIFICATION   Patient Details  Name: [name] MRN: [id number] Date of Birth: 05-13-1951   Medicare Observation Status Notification Given:  Yes    Hoy DELENA Bigness, LCSW 07/19/2024, 12:03 PM

## 2024-07-19 NOTE — Assessment & Plan Note (Signed)
 Renal function stable with serum cr at 1.18 with K at 4.5 and serum bicarbonate at 24  Na 140   Plan to follow up renal function and electrolytes as outpatient.

## 2024-07-19 NOTE — Assessment & Plan Note (Deleted)
 Renal function stable with serum cr at 1.18 with K at 4.5 and serum bicarbonate at 24  Na 140   Plan to follow up renal function and electrolytes as outpatient.

## 2024-07-19 NOTE — Assessment & Plan Note (Addendum)
 No chest pain, continue statin and antiplatelet therapy with aspirin  and clopidogrel .

## 2024-07-19 NOTE — Progress Notes (Signed)
 Patients IV is out and tele has been returned to nurses station and ready to be picked up by her daughter. Teaching has been provided to patient

## 2024-07-19 NOTE — Assessment & Plan Note (Signed)
-  Continue B12 supplementation °

## 2024-07-19 NOTE — Assessment & Plan Note (Signed)
 Continue with sertraline and trazodone.

## 2024-07-19 NOTE — Assessment & Plan Note (Addendum)
 Benign positional vertigo.  Symptoms have improved Patient will continue as needed meclizine  and continue home PT/OT Follow up as outpatient.   Reactive leukocytosis resolved.

## 2024-07-19 NOTE — Plan of Care (Signed)
  Problem: Health Behavior/Discharge Planning: Goal: Ability to identify and utilize available resources and services will improve Outcome: Progressing Goal: Ability to manage health-related needs will improve Outcome: Progressing   Problem: Metabolic: Goal: Ability to maintain appropriate glucose levels will improve Outcome: Progressing   Problem: Nutritional: Goal: Maintenance of adequate nutrition will improve Outcome: Progressing Goal: Progress toward achieving an optimal weight will improve Outcome: Progressing   Problem: Skin Integrity: Goal: Risk for impaired skin integrity will decrease Outcome: Progressing   Problem: Tissue Perfusion: Goal: Adequacy of tissue perfusion will improve Outcome: Progressing   Problem: Education: Goal: Ability to describe self-care measures that may prevent or decrease complications (Diabetes Survival Skills Education) will improve Outcome: Progressing Goal: Individualized Educational Video(s) Outcome: Progressing   Problem: Coping: Goal: Ability to adjust to condition or change in health will improve Outcome: Progressing   Problem: Fluid Volume: Goal: Ability to maintain a balanced intake and output will improve Outcome: Progressing   Problem: Health Behavior/Discharge Planning: Goal: Ability to identify and utilize available resources and services will improve Outcome: Progressing Goal: Ability to manage health-related needs will improve Outcome: Progressing   Problem: Metabolic: Goal: Ability to maintain appropriate glucose levels will improve Outcome: Progressing   Problem: Nutritional: Goal: Maintenance of adequate nutrition will improve Outcome: Progressing Goal: Progress toward achieving an optimal weight will improve Outcome: Progressing   Problem: Skin Integrity: Goal: Risk for impaired skin integrity will decrease Outcome: Progressing   Problem: Tissue Perfusion: Goal: Adequacy of tissue perfusion will  improve Outcome: Progressing   Problem: Education: Goal: Expressions of having a comfortable level of knowledge regarding the disease process will increase Outcome: Progressing   Problem: Coping: Goal: Ability to adjust to condition or change in health will improve Outcome: Progressing Goal: Ability to identify appropriate support needs will improve Outcome: Progressing   Problem: Health Behavior/Discharge Planning: Goal: Compliance with prescribed medication regimen will improve Outcome: Progressing   Problem: Medication: Goal: Risk for medication side effects will decrease Outcome: Progressing   Problem: Clinical Measurements: Goal: Complications related to the disease process, condition or treatment will be avoided or minimized Outcome: Progressing Goal: Diagnostic test results will improve Outcome: Progressing   Problem: Safety: Goal: Verbalization of understanding the information provided will improve Outcome: Progressing   Problem: Self-Concept: Goal: Level of anxiety will decrease Outcome: Progressing Goal: Ability to verbalize feelings about condition will improve Outcome: Progressing

## 2024-07-19 NOTE — Discharge Summary (Addendum)
 Physician Discharge Summary   Patient: [name] MRN: [id number] DOB: 1951/09/09  Admit date:     07/18/2024  Discharge date: 07/19/24  Discharge Physician: Elidia Sieving Jerl Munyan   PCP: Karen [name], FNP   Recommendations at discharge:    Added as needed meclizine  for dizziness Outpatient home health services with PT and OT Holding furosemide  and Kcl supplements for now, due to risk of dehydration and electrolyte abnormalities Follow up renal function and electrolytes as outpatient in 7 days.  Follow up with Annabella Joesph FNP in 7 to 10 days  Discharge Diagnoses: Principal Problem:   Vertigo Active Problems:   Coronary artery disease   Seizures (HCC)   Chronic kidney disease, stage 3a (HCC)   Type 2 diabetes mellitus with hyperlipidemia (HCC)   B12 deficiency   Depression   Essential hypertension  Resolved Problems:   * No resolved hospital problems. [facility] Course: [name] was admitted to the hospital with the working diagnosis of benign positional vertigo flare.  73 yo female with the past medical history of T2DM, hyperlipidemia, coronary artery disease and depression who presented with generalized weakness.  At home she had dizziness, vertigo and falls.  After the 3rd fall on the day of admission, EMS was called and she was transported to the ED.  On her initial physical examination her blood pressure was 147/68, HR 72, RR 13 and 02 saturation 98%  Lungs with no wheezing or rhonchi, heart with S1 and S2 present and regular, abdomen with no distention and no lower extremity edema. Neurologically she was non focal.   Na 140, K 4.6 Cl 105 bicarbonate 25 glucose 66 bun 14 cr 1.37  AST 15 ALT 8  Urine analysis SG 1,012, negative protein, negative leukocytes, small hgb, glucose > 500   CT head with no acute intracranial abnormality No acute fracture or traumatic malalignment of the cervical spine. Multilevel degenerative changes including likely moderate  left foraminal stenosis at multiple levels and at least moderate canal stenosis at C5 and C6.   EKG 68 bpm, left axis deviation, normal intervals, qtc 440, sinus rhythm with no significant ST segment or T wave changes.   Patient was placed on IV fluids and as needed meclizine .  PT and OT 10/26 symptoms have improved. Plan to continue home health services.   Assessment and Plan: * Vertigo Benign positional vertigo.  Symptoms have improved Patient will continue as needed meclizine  and continue home PT/OT Follow up as outpatient.   Reactive leukocytosis resolved.   Coronary artery disease No chest pain, continue statin and antiplatelet therapy with aspirin  and clopidogrel .   Seizures (HCC) Continue with keppra  No signs of active seizures.   Chronic kidney disease, stage 3a (HCC) Renal function stable with serum cr at 1.18 with K at 4.5 and serum bicarbonate at 24  Na 140   Plan to follow up renal function and electrolytes as outpatient.   Type 2 diabetes mellitus with hyperlipidemia (HCC) Patient was placed on insulin  sliding scale for glucose cover and monitoring Positive hyper and hypoglycemia.   At  discharge will resume SGLT 2 inh, insulin  and linagliptin .  Follow up as outpatient  Continue statin   B12 deficiency Continue B12 supplementation  Depression Continue with sertraline  and trazodone .   Essential hypertension Continue blood pressure control with isosorbide , lisinopril  and metoprolol  Hold on loop diuretic for now.        Consultants: none  Procedures performed: none   Disposition: Home Diet recommendation:  Cardiac diet DISCHARGE MEDICATION: Allergies as of 07/19/2024       Reactions   Iohexol  Anaphylaxis, Nausea Only   Pt had syncopal episode with nausea post IV CM late 1990's,  pt has had prednisone  prep with heart caths x 2 without problem  kdean 04/16/07, Onset Date: 04-15-1996   Codeine Nausea And Vomiting, Palpitations   Metformin  And  Related Diarrhea   Ticlid [ticlopidine Hcl] Nausea And Vomiting   Jardiance  [empagliflozin ] Other (See Comments)   Recurrent UTIs        Medication List     STOP taking these medications    furosemide  20 MG tablet Commonly known as: LASIX    potassium chloride  10 MEQ tablet Commonly known as: KLOR-CON  M       TAKE these medications    acetaminophen  500 MG tablet Commonly known as: TYLENOL  Take 1,000 mg by mouth every 6 (six) hours as needed for mild pain (pain score 1-3) or headache.   aspirin  81 MG chewable tablet Chew 1 tablet (81 mg total) by mouth daily.   Blood Glucose Monitoring Suppl Devi 1 each by Does not apply route in the morning, at noon, and at bedtime. May substitute to any manufacturer covered by patient's insurance.   clopidogrel  75 MG tablet Commonly known as: PLAVIX  Take 1 tablet (75 mg total) by mouth daily with breakfast.   empagliflozin  25 MG Tabs tablet Commonly known as: Jardiance  Take 1 tablet (25 mg total) by mouth daily before breakfast.   famotidine  20 MG tablet Commonly known as: Pepcid  Take 1 tablet (20 mg total) by mouth 2 (two) times daily.   FreeStyle Libre 3 Plus Sensor Misc Change sensor every 15 days. Apply to back of upper arm. DX: E11.65   isosorbide  mononitrate 60 MG 24 hr tablet Commonly known as: IMDUR  Take 1 tablet (60 mg total) by mouth daily.   levETIRAcetam  500 MG tablet Commonly known as: Keppra  Take 1 tablet (500 mg total) by mouth 2 (two) times daily.   levocetirizine 5 MG tablet Commonly known as: XYZAL  TAKE 1/2 TABLET BY MOUTH EVERY EVENING   lisinopril  10 MG tablet Commonly known as: ZESTRIL  Take 1 tablet (10 mg total) by mouth daily.   loratadine  10 MG tablet Commonly known as: CLARITIN  Take 10 mg by mouth daily as needed for allergies.   meclizine  25 MG tablet Commonly known as: ANTIVERT  Take 1 tablet (25 mg total) by mouth 3 (three) times daily as needed for dizziness.   metoprolol  tartrate 25  MG tablet Commonly known as: LOPRESSOR  Take 1 tablet (25 mg total) by mouth 2 (two) times daily.   MUSCLE CRAMP COMPLEX PO Take 1 tablet by mouth as needed (muscle spasms).   nitroGLYCERIN  0.4 MG SL tablet Commonly known as: NITROSTAT  DISSOLVE 1 TABLET UNDER TONGUE FOR CHESTPAIN.MAY REPEAT EVERY 5 MINUTES FOR 3 DOSES.IF NO RELIEF CALL 911 OR GO TO ER   NovoLOG  Mix 70/30 FlexPen (70-30) 100 UNIT/ML FlexPen Generic drug: insulin  aspart protamine  - aspart Inject 20 Units into the skin 2 (two) times daily with a meal.   rOPINIRole  1 MG tablet Commonly known as: REQUIP  TAKE 1 TABLET BY MOUTH DAILY AS NEEDED   rosuvastatin  20 MG tablet Commonly known as: CRESTOR  TAKE ONE (1) TABLET BY MOUTH EVERY DAY   sertraline  50 MG tablet Commonly known as: ZOLOFT  Take 1.5 tablets (75 mg total) by mouth daily.   Tradjenta  5 MG Tabs tablet Generic drug: linagliptin  Take 1 tablet (5 mg total) by mouth daily.  traZODone  50 MG tablet Commonly known as: DESYREL  Take 1 tablet (50 mg total) by mouth at bedtime as needed for sleep.        Follow-up Information     [facility] Follow up.   Why: [facility] will follow up with you at discharge to provide home health services Contact information: [address] [place] [address] [phone]                Discharge Exam: Filed Weights   07/18/24 1829 07/18/24 2214  Weight: 72.6 kg 73.9 kg   BP (!) 150/66   Pulse 63   Temp 97.8 F (36.6 C) (Oral)   Resp 15   Ht 5' 6 (1.676 m)   Wt 73.9 kg   SpO2 96%   BMI 26.30 kg/m   Patient is feeling better, dizziness and vertigo have improved, patient has worked with physical therapy with good toleration.   Neurology awake and alert ENT with mild pallor with no icterus Cardiovascular with S1 and S2 present and regular with no gallops, rubs or murmurs Respiratory with no rales or wheezing, no rhonchi  Abdomen with no distention, soft and non tender No  lower extremity edema   Condition at discharge: stable  The results of significant diagnostics from this hospitalization (including imaging, microbiology, ancillary and laboratory) are listed below for reference.   Imaging Studies: CT Head Wo Contrast Result Date: 07/18/2024 EXAM: CT HEAD AND CERVICAL SPINE 07/18/2024 07:55:03 PM TECHNIQUE: CT of the head and cervical spine was performed without the administration of intravenous contrast. Multiplanar reformatted images are provided for review. Automated exposure control, iterative reconstruction, and/or weight based adjustment of the mA/kV was utilized to reduce the radiation dose to as low as reasonably achievable. COMPARISON: CT head and CT cervical spine 01/08/2024 CLINICAL HISTORY: Polytrauma, blunt. general weakness. Pt has equal grips on both sides per EMS. 3 falls today and has been weak since she has gotten up this morning. Hx of diabetes. FINDINGS: CT HEAD BRAIN AND VENTRICLES: No acute intracranial hemorrhage. No mass effect or midline shift. No abnormal extra-axial fluid collection. No evidence of acute infarct. No hydrocephalus. ORBITS: No acute abnormality. SINUSES AND MASTOIDS: No acute abnormality. SOFT TISSUES AND SKULL: No acute skull fracture. No acute soft tissue abnormality. CT CERVICAL SPINE BONES AND ALIGNMENT: No acute fracture or traumatic malalignment. DEGENERATIVE CHANGES: Multilevel central percutaneous hypertrophy including likely moderate left foraminal stenosis at multiple levels including C5 C6 and C6-C7. At least moderate canal stenosis at C5-C6 SOFT TISSUES: No prevertebral soft tissue swelling. IMPRESSION: 1. No acute intracranial abnormality. 2. No acute fracture or traumatic malalignment of the cervical spine. 3. 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 MRI could further characterize if clinically warranted.  Electronically signed by: Gilmore Molt MD 07/18/2024 08:25 PM EDT RP Workstation: [id number]   CT Cervical Spine Wo Contrast Result Date: 07/18/2024 EXAM: CT HEAD AND CERVICAL SPINE 07/18/2024 07:55:03 PM TECHNIQUE: CT of the head and cervical spine was performed without the administration of intravenous contrast. Multiplanar reformatted images are provided for review. Automated exposure control, iterative reconstruction, and/or weight based adjustment of the mA/kV was utilized to reduce the radiation dose to as low as reasonably achievable. COMPARISON: CT head and CT cervical spine 01/08/2024 CLINICAL HISTORY: Polytrauma, blunt. general weakness. Pt has equal grips on both sides per EMS. 3 falls today and has been weak since  she has gotten up this morning. Hx of diabetes. FINDINGS: CT HEAD BRAIN AND VENTRICLES: No acute intracranial hemorrhage. No mass effect or midline shift. No abnormal extra-axial fluid collection. No evidence of acute infarct. No hydrocephalus. ORBITS: No acute abnormality. SINUSES AND MASTOIDS: No acute abnormality. SOFT TISSUES AND SKULL: No acute skull fracture. No acute soft tissue abnormality. CT CERVICAL SPINE BONES AND ALIGNMENT: No acute fracture or traumatic malalignment. DEGENERATIVE CHANGES: Multilevel central percutaneous hypertrophy including likely moderate left foraminal stenosis at multiple levels including C5 C6 and C6-C7. At least moderate canal stenosis at C5-C6 SOFT TISSUES: No prevertebral soft tissue swelling. IMPRESSION: 1. No acute intracranial abnormality. 2. No acute fracture or traumatic malalignment of the cervical spine. 3. 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 MRI could further characterize if clinically warranted. Electronically signed by: Gilmore Molt MD 07/18/2024 08:25 PM EDT RP Workstation: [id number]    Microbiology: Results for orders  placed or performed in visit on 05/06/24  Microscopic Examination     Status: Abnormal   Collection Time: 05/06/24  4:00 PM   Urine  Result Value Ref Range Status   WBC, UA None seen 0 - 5 /hpf Final   RBC, Urine 0-2 0 - 2 /hpf Final   Epithelial Cells (non renal) 0-10 0 - 10 /hpf Final   Renal Epithel, UA None seen None seen /hpf Final   Bacteria, UA Few (A) None seen/Few Final   Yeast, UA Present (A) None seen Final  Urine Culture     Status: None   Collection Time: 05/06/24  4:06 PM   Specimen: Urine   UR  Result Value Ref Range Status   Urine Culture, Routine Final report  Final   Organism ID, Bacteria Comment  Final    Comment: Mixed urogenital flora 25,000-50,000 colony forming units per mL    *Note: Due to a large number of results and/or encounters for the requested time period, some results have not been displayed. A complete set of results can be found in Results Review.    Labs: CBC: Recent Labs  Lab 07/18/24 2025 07/19/24 0421  WBC 12.0* 7.1  NEUTROABS 5.7  --   HGB 10.9* 11.3*  HCT 33.0* 35.0*  MCV 89.4 89.5  PLT 162 129*   Basic Metabolic Panel: Recent Labs  Lab 07/18/24 1940 07/19/24 0421  NA 140 140  K 4.6 4.5  CL 105 107  CO2 25 24  GLUCOSE 66* 151*  BUN 14 13  CREATININE 1.37* 1.18*  CALCIUM  9.4 8.9   Liver Function Tests: Recent Labs  Lab 07/18/24 1940  AST 15  ALT 8  ALKPHOS 82  BILITOT 0.4  PROT 7.1  ALBUMIN  3.9   CBG: Recent Labs  Lab 07/18/24 2037 07/18/24 2119 07/18/24 2221 07/19/24 0717 07/19/24 1113  GLUCAP 62* 110* 135* 157* 230*    Discharge time spent: greater than 30 minutes.  Signed: Elidia Toribio Furnace, MD [facility] 07/19/2024

## 2024-07-19 NOTE — Assessment & Plan Note (Signed)
 Patient was placed on insulin  sliding scale for glucose cover and monitoring Positive hyper and hypoglycemia.   At  discharge will resume SGLT 2 inh, insulin  and linagliptin .  Follow up as outpatient  Continue statin

## 2024-07-19 NOTE — Assessment & Plan Note (Signed)
 Continue with keppra  No signs of active seizures.

## 2024-07-19 NOTE — TOC Initial Note (Signed)
 Transition of Care [facility]) - Initial/Assessment Note    Patient Details  Name: [name] MRN: [id number] Date of Birth: Jun 07, 1951  Transition of [facility]) CM/SW Contact:    Hoy DELENA Bigness, LCSW Phone Number: 07/19/2024, 12:07 PM  Clinical Narrative:                 Pt from home with spouse. Confirmed plan to return home with home health services. Pt shares she recently discharged from [facility] services with [facility] and would like services with this agency again. HHPT/OT has been arranged with Dorothe with [facility]. HH orders are in place.   Expected Discharge Plan: Home w Home Health Services Barriers to Discharge: No Barriers Identified   Patient Goals and CMS Choice Patient states their goals for this hospitalization and ongoing recovery are:: To return home CMS Medicare.gov Compare Post Acute Care list provided to:: Patient Choice offered to / list presented to : Patient      Expected Discharge Plan and Services In-house Referral: Clinical Social Work Discharge Planning Services: NA Post Acute Care Choice: Home Health Living arrangements for the past 2 months: Single Family Home                 DME Arranged: N/A DME Agency: NA       HH Arranged: PT, OT HH Agency: [facility] Date HH Agency Contacted: 07/19/24 Time HH Agency Contacted: 1206 Representative spoke with at [facility] Agency: Dorothe  Prior Living Arrangements/Services Living arrangements for the past 2 months: Single Family Home Lives with:: Spouse Patient language and need for interpreter reviewed:: Yes Do you feel safe going back to the place where you live?: Yes      Need for Family Participation in Patient Care: No (Comment) Care giver support system in place?: Yes (comment) Current home services: DME Criminal Activity/Legal Involvement Pertinent to Current Situation/Hospitalization: No - Comment as needed  Activities of Daily Living   ADL Screening (condition at time of admission) Independently  performs ADLs?: No Does the patient have a NEW difficulty with bathing/dressing/toileting/self-feeding that is expected to last >3 days?: Yes (Initiates electronic notice to provider for possible OT consult) Does the patient have a NEW difficulty with getting in/out of bed, walking, or climbing stairs that is expected to last >3 days?: Yes (Initiates electronic notice to provider for possible PT consult) Does the patient have a NEW difficulty with communication that is expected to last >3 days?: No Is the patient deaf or have difficulty hearing?: No Does the patient have difficulty seeing, even when wearing glasses/contacts?: No Does the patient have difficulty concentrating, remembering, or making decisions?: No  Permission Sought/Granted Permission sought to share information with : Family Supports, Oceanographer granted to share information with : Yes, Verbal Permission Granted  Share Information with NAME: Spouse  Permission granted to share info w AGENCY: [facility]        Emotional Assessment Appearance:: Appears stated age Attitude/Demeanor/Rapport: Engaged Affect (typically observed): Accepting Orientation: : Oriented to Self, Oriented to Place, Oriented to  Time, Oriented to Situation Alcohol / Substance Use: Not Applicable Psych Involvement: No (comment)  Admission diagnosis:  Hypoglycemia [E16.2] B12 deficiency [E53.8] Patient Active Problem List   Diagnosis Date Noted   Hypoglycemia 07/18/2024   Essential hypertension, benign 06/18/2024   Insulin  long-term use (HCC) 06/18/2024   Acute metabolic encephalopathy 05/18/2024   Chronic kidney disease, stage 3a (HCC) 05/18/2024   Seizures (HCC) 05/17/2024   Hypoalbuminemia due to protein-calorie malnutrition  05/17/2024   Nonrheumatic mitral valve stenosis 02/03/2024   Type 2 diabetes mellitus with stage 3a chronic kidney disease, with long-term current use of insulin  (HCC) 01/16/2024   Seizure-like  activity (HCC) 01/14/2024   Clavicular fracture 01/14/2024   Hypertensive urgency 11/28/2023   Stage 3b chronic kidney disease (HCC) 10/09/2023   Vertigo 05/13/2023   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   Unstable angina (HCC) 02/12/2023   Incomplete emptying of bladder 02/20/2022   Restless leg syndrome 01/09/2021   Insomnia due to medical condition 01/09/2021   Congestive heart failure (HCC) 11/30/2020   Depression 11/02/2020   Recurrent UTI 10/11/2020   Generalized weakness 10/11/2020   Recurrent falls 10/11/2020   QT prolongation 10/11/2020   Osteopenia 12/13/2017   B12 deficiency 01/13/2016   History of stroke 09/01/2015   History of TIA (transient ischemic attack) 08/14/2014   Cataract 02/08/2014   Macular degeneration 02/08/2014   Accelerating angina (HCC) 01/07/2014   Generalized anxiety disorder 09/14/2013   Claudication 06/18/2013   Gastroesophageal reflux disease without esophagitis 01/01/2012   Hypokalemia 08/24/2011   Type 2 diabetes mellitus with hyperglycemia (HCC) 08/23/2011   Hypertension associated with type 2 diabetes mellitus (HCC) 08/23/2011   PALPITATIONS 02/10/2010   Mixed hyperlipidemia 05/10/2009   Coronary artery disease involving native coronary artery of native heart with unstable angina pectoris (HCC) 08/26/2008   PCP:  [name], FNP Pharmacy:   [facility] [place] [place] - [address] [place] [address] Phone: [phone] Fax: [phone]     Social Drivers of Health (SDOH) Social History: SDOH Screenings   Food Insecurity: No Food Insecurity (07/18/2024)  Housing: Low Risk  (07/18/2024)  Transportation Needs: No Transportation Needs (07/18/2024)  Utilities: Not At Risk (07/18/2024)  Alcohol Screen: Low Risk  (01/24/2023)  Depression (PHQ2-9): Low Risk  (05/26/2024)  Recent Concern: Depression (PHQ2-9) - High Risk (05/06/2024)  Financial Resource Strain: Low Risk  (01/24/2023)  Physical Activity:  Insufficiently Active (01/24/2023)  Social Connections: Moderately Integrated (07/18/2024)  Stress: No Stress Concern Present (01/24/2023)  Tobacco Use: Low Risk  (07/18/2024)   SDOH Interventions:     Readmission Risk Interventions    05/18/2024   10:04 AM  Readmission Risk Prevention Plan  Transportation Screening Complete  HRI or Home Care Consult Complete  Social Work Consult for Recovery Care Planning/Counseling Complete  Palliative Care Screening Not Applicable  Medication Review Oceanographer) Complete

## 2024-07-19 NOTE — Hospital Course (Addendum)
 [name] was admitted to the hospital with the working diagnosis of benign positional vertigo flare.  73 yo female with the past medical history of T2DM, hyperlipidemia, coronary artery disease and depression who presented with generalized weakness.  At home she had dizziness, vertigo and falls.  After the 3rd fall on the day of admission, EMS was called and she was transported to the ED.  On her initial physical examination her blood pressure was 147/68, HR 72, RR 13 and 02 saturation 98%  Lungs with no wheezing or rhonchi, heart with S1 and S2 present and regular, abdomen with no distention and no lower extremity edema. Neurologically she was non focal.   Na 140, K 4.6 Cl 105 bicarbonate 25 glucose 66 bun 14 cr 1.37  AST 15 ALT 8  Urine analysis SG 1,012, negative protein, negative leukocytes, small hgb, glucose > 500   CT head with no acute intracranial abnormality No acute fracture or traumatic malalignment of the cervical spine. Multilevel degenerative changes including likely moderate left foraminal stenosis at multiple levels and at least moderate canal stenosis at C5 and C6.   EKG 68 bpm, left axis deviation, normal intervals, qtc 440, sinus rhythm with no significant ST segment or T wave changes.   Patient was placed on IV fluids and as needed meclizine .  PT and OT 10/26 symptoms have improved. Plan to continue home health services.

## 2024-07-21 ENCOUNTER — Telehealth: Payer: Self-pay

## 2024-07-21 NOTE — Telephone Encounter (Unsigned)
 Copied from CRM #[id number]. Topic: Clinical - Home Health Verbal Orders >> Jul 21, 2024  1:52 PM Harlene ORN wrote: Dor[facility]  Wants to know if her PCP will follow up on her patient's orders.  Phone: [phone]

## 2024-07-21 NOTE — Telephone Encounter (Signed)
Complete  -LS

## 2024-07-23 ENCOUNTER — Other Ambulatory Visit: Payer: Self-pay | Admitting: Family Medicine

## 2024-07-23 DIAGNOSIS — E119 Type 2 diabetes mellitus without complications: Secondary | ICD-10-CM

## 2024-08-03 ENCOUNTER — Other Ambulatory Visit (HOSPITAL_COMMUNITY): Payer: Self-pay

## 2024-08-03 ENCOUNTER — Ambulatory Visit (INDEPENDENT_AMBULATORY_CARE_PROVIDER_SITE_OTHER): Admitting: *Deleted

## 2024-08-03 DIAGNOSIS — E538 Deficiency of other specified B group vitamins: Secondary | ICD-10-CM | POA: Diagnosis not present

## 2024-08-03 NOTE — Progress Notes (Addendum)
 Patient is in office today for a nurse visit for B12 Injection. Patient Injection was given in the  Left deltoid. Patient tolerated injection well.

## 2024-08-04 ENCOUNTER — Ambulatory Visit: Payer: Self-pay

## 2024-08-04 ENCOUNTER — Telehealth: Payer: Self-pay | Admitting: Family Medicine

## 2024-08-04 NOTE — Telephone Encounter (Signed)
 Called and spoke with Marval and gave her verbal approval for PT and OT evals.

## 2024-08-04 NOTE — Telephone Encounter (Signed)
 Copied from CRM [id number]. Topic: Clinical - Home Health Verbal Orders >> Aug 04, 2024  2:14 PM Delon DASEN wrote: Caller/Agency: Magdalena Deiters [facility] Callback Number: [phone] secure line Service Requested: Physical Therapy and Occupational Therapy Frequency: n/a Any new concerns about the patient? No- need a call back today if Annabella will sign orders for Home Health

## 2024-08-05 ENCOUNTER — Other Ambulatory Visit: Payer: Self-pay | Admitting: Family Medicine

## 2024-08-05 ENCOUNTER — Telehealth: Payer: Self-pay

## 2024-08-05 NOTE — Telephone Encounter (Signed)
 Copied from CRM [id number]. Topic: Clinical - Medication Refill >> Aug 05, 2024 11:38 AM Avram MATSU wrote: Medication: linagliptin  (TRADJENTA ) 5 MG TABS tablet [[id number]]  Has the patient contacted their pharmacy? Yes (Agent: If no, request that the patient contact the pharmacy for the refill. If patient does not wish to contact the pharmacy document the reason why and proceed with request.) (Agent: If yes, when and what did the pharmacy advise?)  This is the patient's preferred pharmacy:  [facility], [place] - [address] [place] [address] Phone: [phone] Fax: [phone]  Is this the correct pharmacy for this prescription? Yes If no, delete pharmacy and type the correct one.   Has the prescription been filled recently? No  Is the patient out of the medication? Yes  Has the patient been seen for an appointment in the last year OR does the patient have an upcoming appointment? Yes  Can we respond through MyChart? Yes  Agent: Please be advised that Rx refills may take up to 3 business days. We ask that you follow-up with your pharmacy.

## 2024-08-05 NOTE — Telephone Encounter (Signed)
 Copied from CRM [id number]. Topic: Clinical - Home Health Verbal Orders >> Aug 05, 2024 11:43 AM Deaijah H wrote: Caller/Agency: [place] PT w/ [facility]  Callback Number: [phone] Service Requested: Physical Therapy / Skilled nursing eval (for med management)  Frequency: 1x a wk for 9 wks ; eval for skilled nursing (next wk) Any new concerns about the patient? No, patient declined OT eval

## 2024-08-05 NOTE — Telephone Encounter (Signed)
 Spoke with [name], verbal okay given. He stated pt was confused on some medications. Informed [name] of recent changes, states he will call pt and let her know

## 2024-08-11 NOTE — Telephone Encounter (Signed)
 FYI for PCP  Copied from CRM [id number]. Topic: General - Other >> Aug 11, 2024  3:52 PM Larissa S wrote: Reason for CRM: Will, OT with [facility] calling to inform provider that patient has refused OT evaluation.  Callback # [phone]

## 2024-08-13 ENCOUNTER — Ambulatory Visit: Payer: Self-pay

## 2024-08-13 DIAGNOSIS — R5383 Other fatigue: Secondary | ICD-10-CM | POA: Diagnosis not present

## 2024-08-13 DIAGNOSIS — R296 Repeated falls: Secondary | ICD-10-CM | POA: Diagnosis not present

## 2024-08-13 DIAGNOSIS — R350 Frequency of micturition: Secondary | ICD-10-CM | POA: Diagnosis not present

## 2024-08-13 NOTE — Telephone Encounter (Signed)
Fyi noted.

## 2024-08-13 NOTE — Telephone Encounter (Signed)
 FYI Only or Action Required?: FYI only for provider: No appts available today, advised UC today. Pt agreeable to go.  Patient was last seen in primary care on 06/08/2024 by [name], FNP.  Called Nurse Triage reporting Fall, Blood Sugar Problem, and Dysuria.  Symptoms began a week ago.  Interventions attempted: Prescription medications: Novolog  and Rest, hydration, or home remedies.  Symptoms are: gradually worsening.  Triage Disposition: See HCP Within 4 Hours (Or PCP Triage)  Patient/caregiver understands and will follow disposition?: Yes  Reason for Triage: Per Leah with [facility], patient is experiencing Fatigue, high glucose levels, weakness, instability with standing and walking, and increase urination. Patient refused to go to ER as advised by [facility] her PT. Please contact patient at Ph.[phone]  Leah PT Ph: [phone]   Reason for Disposition  [1] MODERATE weakness (e.g., interferes with work, school, normal activities) AND [2] new-onset or getting worse  Diabetes mellitus or weak immune system (e.g., HIV positive, cancer chemo, splenectomy, organ transplant, chronic steroids)  [1] Blood glucose 240 - 300 mg/dL (13.3 - 16.7 mmol/L) AND [2] uses insulin  (e.g., insulin -dependent, all people with type 1 diabetes)  Answer Assessment - Initial Assessment Questions Pt fell last night after legs buckled under her. Fell on her bottom, denies hitting her head either time. Takes plavix  and ASA. EMS was called and they assisted pt back into bed. Pt states EMS didn't think she needed to go to hospital. No cuts bruises or bleeding. Pts PT called today for checkup and was advised to go to ED. Pt declined, states she's gone to [facility] for frequent falls 3x over the past several months d/t generalized weakness and states they don't tell her anything or do anything. Pt also has DM2 and reports having a libre sensor that has read  high intermittently for the past few  days. Occurs when blood sugar gets above 400. Blood sugar currently 251. Takes Novolog  20 untis BID, last dose at 1pm today. Has not missed any doses. Reports frequent urination with burning x1 week. Denies fever, CP, SOB, rapid breathing or N/V. Called [facility] and spoke with Montie to see if pt could be worked in today. No appts available, advised UC today. Pt reports someone could take her today. Advised UC or ED for worsening symptoms.   1. MECHANISM: How did the fall happen?     Legs buckled and lost balance, pt reports having poor leg strength  2. DOMESTIC VIOLENCE AND ELDER ABUSE SCREENING: Did you fall because someone pushed you or tried to hurt you? If Yes, ask: Are you safe now?     Denies  3. ONSET: When did the fall happen? (e.g., minutes, hours, or days ago)     At 3 am this morning and again later today  4. LOCATION: What part of the body hit the ground? (e.g., back, buttocks, head, hips, knees, hands, head, stomach)     Pt reports hitting her bottom both times  5. INJURY: Did you hurt (injure) yourself when you fell? If Yes, ask: What did you injure? Tell me more about this? (e.g., body area; type of injury; pain severity)     Fell on bottom, pt denies cuts bruises or swelling  6. PAIN: Is there any pain? If Yes, ask: How bad is the pain? (e.g., Scale 0-10; or none, mild,      Denies  7. SIZE: For cuts, bruises, or swelling, ask: How large is it? (e.g., inches or centimeters)  Denies  9. OTHER SYMPTOMS: Do you have any other symptoms? (e.g., dizziness, fever, weakness; new-onset or worsening).      Urinary frequency and incontinence, burning with urination and itching in vaginal area x1 week. Denies fever. Pt reports hx of CKD3.  10. CAUSE: What do you think caused the fall (or falling)? (e.g., dizzy spell, tripped)       Pt reports her legs gave out the first time d/t poor leg strength and then lost her balance during the second fall. Uses a cane  and FWW, pt reports increased difficulty walking for past 3 months.  Answer Assessment - Initial Assessment Questions 1. BLOOD GLUCOSE: What is your blood glucose level?      251  2. ONSET: When did you check the blood glucose?     Now  3. USUAL RANGE: What is your glucose level usually? (e.g., usual fasting morning value, usual evening value)     125  4. KETONES: Do you check for ketones (urine or blood test strips)? If Yes, ask: What does the test show now?      Denies  5. TYPE 1 or 2:  Do you know what type of diabetes you have?  (e.g., Type 1, Type 2, Gestational; doesn't know)      Type 2  6. INSULIN : Do you take insulin ? What type of insulin (s) do you use? What is the mode of delivery? (syringe, pen; injection or pump)?      20 units Novolog  BID. Last taken at 1pm today. Pt reports taking TID sometimes based on blood sugar readings.  7. DIABETES PILLS: Do you take any pills for your diabetes? If Yes, ask: Have you missed taking any pills recently?     Denies  8. OTHER SYMPTOMS: Do you have any symptoms? (e.g., fever, frequent urination, difficulty breathing, dizziness, weakness, vomiting)     Frequent urination, weakness and frequent falls x 6 months.  Answer Assessment - Initial Assessment Questions 1. SEVERITY: How bad is the pain?  (e.g., Scale 1-10; mild, moderate, or severe)     Mild burning with urination and some itching  2. FREQUENCY: How many times have you had painful urination today?      5-6 times today, pt reports frequent urination  3. PATTERN: Is pain present every time you urinate or just sometimes?      Yes   4. ONSET: When did the painful urination start?      1 week ago  5. FEVER: Do you have a fever? If Yes, ask: What is your temperature, how was it measured, and when did it start?     Denies  6. PAST UTI: Have you had a urine infection before? If Yes, ask: When was the last time? and What happened that time?       Yes, had to take abxs  7. CAUSE: What do you think is causing the painful urination?  (e.g., UTI, scratch, Herpes sore)     Pt unsure  8. OTHER SYMPTOMS: Do you have any other symptoms? (e.g., blood in urine, flank pain, genital sores, urgency, vaginal discharge)     Denies  Protocols used: Falls and Falling-A-AH, Diabetes - High Blood Sugar-A-AH, Urination Pain - Female-A-AH

## 2024-08-14 ENCOUNTER — Ambulatory Visit: Payer: Self-pay

## 2024-08-14 ENCOUNTER — Telehealth: Payer: Self-pay | Admitting: Family Medicine

## 2024-08-14 ENCOUNTER — Telehealth: Payer: Self-pay

## 2024-08-14 NOTE — Telephone Encounter (Signed)
 Copied from CRM [id number]. Topic: Clinical - Prescription Issue >> Aug 14, 2024  1:03 PM Tysheama G wrote: Reason for CRM: Patient still need her medication linagliptin  (TRADJENTA ) 5 MG TABS tablet; she put the request in 11/12 and have heard nothing. And patient has been out for awhile now. Can someone PLEASE refill patients medication.

## 2024-08-14 NOTE — Telephone Encounter (Signed)
 Spoke [facility] health aide. She says endo keeps deferring back to PCP. She said they tried calling endo today and they all left at noon.

## 2024-08-14 NOTE — Telephone Encounter (Signed)
 Per Endo's note on 07/16/24, Dr. Lenis wanted to stop Trajenta once she finished her supply of it. Will need to request a refill from endo as it is up to Dr. Lenis as far as what medications she takes for DM since he is managing it.

## 2024-08-14 NOTE — Telephone Encounter (Signed)
 Copied from CRM [id number]. Topic: Clinical - Medical Advice >> Aug 14, 2024  1:01 PM Terri G wrote: Reason for CRM: Leah patients home health wanted to let Dr.Morgan know that patient did visit the ER last night and that can someone please call her. Callback number [phone]

## 2024-08-14 NOTE — Telephone Encounter (Signed)
 This is in regard to other phone call encounter regarding tradjenta .

## 2024-08-14 NOTE — Telephone Encounter (Signed)
Can you call to see what they need?

## 2024-08-14 NOTE — Telephone Encounter (Signed)
 Contacted patient. Notified patient. Patient verbalized understanding.

## 2024-08-14 NOTE — Telephone Encounter (Signed)
 Needs to request refill from endocrinology as they are managing her DM now.

## 2024-08-14 NOTE — Telephone Encounter (Signed)
 Duplicate message. Already sent this Rx request to PCP to advise on. Will close this encounter.

## 2024-08-14 NOTE — Telephone Encounter (Addendum)
 FYI Only or Action Required?: Action required by provider: medication refill request.  Patient was last seen in primary care on 06/08/2024 by Karen [name], FNP.  Called Nurse Triage reporting Hyperglycemia.  Symptoms began over a month.  Interventions attempted: Prescription medications: insulin  Novolog  70/30 and jardiance .  Symptoms are: blood glucose 300 today, generalized weakness, yeast and blood in urine from testing in ED yesterday stable.  Triage Disposition: Call PCP Now (overriding Home Care)  Patient/caregiver understands and will follow disposition?: Yes   Called [facility] and notified staff of request.        Copied from CRM [id number]. Topic: Clinical - Red Word Triage >> Aug 14, 2024  1:57 PM Kevelyn M wrote: Red Word that prompted transfer to Nurse Triage: [facility] nurse calling because the patient's blood sugar is 300. Endocrinology office is closed. Reason for Disposition  [1] Blood glucose > 300 mg/dL (83.2 mmol/L) AND [7] uses insulin  (e.g., insulin -dependent, all people with type 1 diabetes)  Answer Assessment - Initial Assessment Questions Spoke with RN Dickey for triage. Called patient and offered appointment today with DOD. Patient declined and would like message sent to PCP for medication refill request. Patient states she just needs her medication Tradjenta  refilled (attempted to call endocrinologist for refill and their office is closed). Patient verbalizes understanding to call back for new or worsening symptoms.   1. BLOOD GLUCOSE: What is your blood glucose level?      300 then tried to recheck and read error.  2. ONSET: When did you check the blood glucose?     Earlier today, unsure what time. RN states she has been working with the patient over the past month and during that time they have been reading HIGH or elevated.  3. USUAL RANGE: What is your glucose level usually? (e.g., usual fasting morning value, usual evening value)      Unsure exact numbers, has been running high.  4. KETONES: Do you check for ketones (urine or blood test strips)? If Yes, ask: What does the test show now?      No. Patient was seen at [facility] yesterday for a fall. Patient's urine was checked and showed no ketones yesterday.  5. TYPE 1 or 2:  Do you know what type of diabetes you have?  (e.g., Type 1, Type 2, Gestational; doesn't know)      Type 2.  6. INSULIN : Do you take insulin ? What type of insulin (s) do you use? What is the mode of delivery? (syringe, pen; injection or pump)?      Insulin  Novolog  70/30 flex pen. Tradjenta , patient was supposed to be on but insurance was not covering it and the prescription has expired.  7. DIABETES PILLS: Do you take any pills for your diabetes? If Yes, ask: Have you missed taking any pills recently?     Jardiance . Per RN Dickey, patient has been compliant.  8. OTHER SYMPTOMS: Do you have any symptoms? (e.g., fever, frequent urination, difficulty breathing, dizziness, weakness, vomiting)     Weakness (ongoing since beginning of November), concerns about yeast in urine (results from ED yesterday). Patient was triaged and sent to ED yesterday for the weakness and fall. Denies vomiting, confusion, difficulty breathing.  Protocols used: Diabetes - High Blood Sugar-A-AH

## 2024-08-17 ENCOUNTER — Telehealth: Payer: Self-pay | Admitting: Family Medicine

## 2024-08-17 NOTE — Telephone Encounter (Signed)
 Medication list faxed to [facility]

## 2024-08-17 NOTE — Telephone Encounter (Signed)
 Copied from CRM #[id number]. Topic: Clinical - Medication Question >> Aug 17, 2024  3:29 PM Antwanette L wrote: Reason for CRM: CJ from [facility] is calling to follow up on a fax request sent on 08/12/24. They are requesting a list of all active prescriptions for the patient. The request can be faxed to [phone] and CJ can be contacted at [phone]

## 2024-08-18 ENCOUNTER — Other Ambulatory Visit: Payer: Self-pay

## 2024-08-18 DIAGNOSIS — E1122 Type 2 diabetes mellitus with diabetic chronic kidney disease: Secondary | ICD-10-CM

## 2024-08-18 MED ORDER — NOVOLOG MIX 70/30 FLEXPEN (70-30) 100 UNIT/ML ~~LOC~~ SUPN: 20.0000 [IU] | PEN_INJECTOR | Freq: Two times a day (BID) | SUBCUTANEOUS | 0 refills | Status: AC

## 2024-08-19 ENCOUNTER — Telehealth: Payer: Self-pay

## 2024-08-19 NOTE — Telephone Encounter (Signed)
 Tried to return call to [facility] with [facility], left a message requesting she return call to the office.

## 2024-08-24 ENCOUNTER — Telehealth: Payer: Self-pay | Admitting: *Deleted

## 2024-08-24 NOTE — Telephone Encounter (Signed)
 Copied from CRM #[id number]. Topic: Clinical - Medication Question >> Aug 17, 2024  3:29 PM Antwanette L wrote: Reason for CRM: CJ from [facility] is calling to follow up on a fax request sent on 08/12/24. They are requesting a list of all active prescriptions for the patient. The request can be faxed to [phone] and CJ can be contacted at [phone] >> Aug 19, 2024  4:48 PM Zebedee SAUNDERS wrote: CJ from [facility] is calling to follow up on a fax request sent on 08/07/2024, 08/12/24, 08/17/2024. [facility] is requesting a list of all active prescriptions for the patient. The request can be faxed to [phone] and CJ can be contacted at [phone].

## 2024-08-24 NOTE — Telephone Encounter (Signed)
 We do not do these type of faxes unless the pt calls and let's us  know they are changing to this pharmacy. Called pt and verified that she is not changing to them. Nothing further will be done.

## 2024-08-25 ENCOUNTER — Telehealth: Payer: Self-pay

## 2024-08-25 NOTE — Telephone Encounter (Signed)
 Tried to return call to pt's physical therapist Marinda), left a message requesting a return call to the office.

## 2024-08-26 ENCOUNTER — Telehealth: Payer: Self-pay | Admitting: Family Medicine

## 2024-08-26 NOTE — Telephone Encounter (Signed)
 Verbal orders given

## 2024-08-26 NOTE — Telephone Encounter (Signed)
 Copied from CRM #[id number]. Topic: Clinical - Home Health Verbal Orders >> Aug 26, 2024  9:38 AM Alfonso ORN wrote: Caller/Agency: [facility] Callback Number: [phone] Service Requested: Skilled Nursing   eval need to be move to tomorrow for 08/27/24  Frequency: N/A Any new concerns about the patient? No  , have not seen the patient yet

## 2024-08-31 ENCOUNTER — Telehealth: Payer: Self-pay | Admitting: Family Medicine

## 2024-08-31 NOTE — Telephone Encounter (Unsigned)
 Copied from CRM [id number]. Topic: General - Call Back - No Documentation >> Aug 31, 2024  1:05 PM Avram G wrote: Reason for CRM: Cecila stated due to the weather she patient needs her evaluation moved to Wednesdays, please advise

## 2024-09-01 NOTE — Telephone Encounter (Signed)
 Okay to move. Tried to call but unable to lm on vm of [facility] [place]. Please advise if she calls back.

## 2024-09-02 ENCOUNTER — Ambulatory Visit (INDEPENDENT_AMBULATORY_CARE_PROVIDER_SITE_OTHER): Admitting: *Deleted

## 2024-09-02 DIAGNOSIS — E538 Deficiency of other specified B group vitamins: Secondary | ICD-10-CM | POA: Diagnosis not present

## 2024-09-02 NOTE — Progress Notes (Addendum)
 Patient is in office today for a nurse visit for B12 Injection.  Injection was given in the  Right deltoid. Patient tolerated injection well.

## 2024-09-07 ENCOUNTER — Encounter: Payer: Self-pay | Admitting: Family Medicine

## 2024-09-07 ENCOUNTER — Ambulatory Visit: Payer: Self-pay | Admitting: Family Medicine

## 2024-09-07 VITALS — BP 129/78 | HR 75 | Temp 98.3°F | Ht 66.0 in

## 2024-09-07 DIAGNOSIS — E1165 Type 2 diabetes mellitus with hyperglycemia: Secondary | ICD-10-CM | POA: Diagnosis not present

## 2024-09-07 DIAGNOSIS — I152 Hypertension secondary to endocrine disorders: Secondary | ICD-10-CM

## 2024-09-07 DIAGNOSIS — R29810 Facial weakness: Secondary | ICD-10-CM | POA: Diagnosis not present

## 2024-09-07 DIAGNOSIS — R4781 Slurred speech: Secondary | ICD-10-CM

## 2024-09-07 DIAGNOSIS — R531 Weakness: Secondary | ICD-10-CM

## 2024-09-07 DIAGNOSIS — I4891 Unspecified atrial fibrillation: Secondary | ICD-10-CM | POA: Insufficient documentation

## 2024-09-07 DIAGNOSIS — R569 Unspecified convulsions: Secondary | ICD-10-CM

## 2024-09-07 DIAGNOSIS — B379 Candidiasis, unspecified: Secondary | ICD-10-CM | POA: Diagnosis not present

## 2024-09-07 DIAGNOSIS — E538 Deficiency of other specified B group vitamins: Secondary | ICD-10-CM

## 2024-09-07 DIAGNOSIS — Z794 Long term (current) use of insulin: Secondary | ICD-10-CM | POA: Diagnosis not present

## 2024-09-07 DIAGNOSIS — D649 Anemia, unspecified: Secondary | ICD-10-CM | POA: Insufficient documentation

## 2024-09-07 DIAGNOSIS — R296 Repeated falls: Secondary | ICD-10-CM

## 2024-09-07 DIAGNOSIS — N1832 Chronic kidney disease, stage 3b: Secondary | ICD-10-CM

## 2024-09-07 DIAGNOSIS — E1159 Type 2 diabetes mellitus with other circulatory complications: Secondary | ICD-10-CM | POA: Diagnosis not present

## 2024-09-07 LAB — MICROSCOPIC EXAMINATION
Bacteria, UA: NONE SEEN
Renal Epithel, UA: NONE SEEN /HPF

## 2024-09-07 LAB — URINALYSIS, ROUTINE W REFLEX MICROSCOPIC
Bilirubin, UA: NEGATIVE
Ketones, UA: NEGATIVE
Leukocytes,UA: NEGATIVE
Nitrite, UA: NEGATIVE
Protein,UA: NEGATIVE
Specific Gravity, UA: 1.01 (ref 1.005–1.030)
Urobilinogen, Ur: 1 mg/dL (ref 0.2–1.0)
pH, UA: 5 (ref 5.0–7.5)

## 2024-09-07 MED ORDER — CLOTRIMAZOLE 1 % VA CREA: 1.0000 | TOPICAL_CREAM | Freq: Every day | VAGINAL | 0 refills | Status: AC

## 2024-09-07 NOTE — Progress Notes (Signed)
 Established Patient Office Visit  Subjective   Patient ID: [name], female    DOB: 1951/06/29  Age: 73 y.o. MRN: [id number]  Chief Complaint  Patient presents with   Medical Management of Chronic Issues    HPI  History of Present Illness   [name] is a 73 year old female with a history of falls and weakness who presents with recurrent falls and generalized weakness.  She has experienced multiple falls since her last visit, with generalized weakness, particularly in her legs. Her knees give out when standing, leading to falls. No dizziness is reported, but her legs feel like they are 'melting.' She struggles to get onto her bed due to its height and is awaiting a lower mattress. She uses a rolling walker with a seat for mobility and has a life alert system set up by her children. She denies leg or knee pain. Denies back pain. She denies chest pain, dizziness, palpitations, shortness of breath, nausea, vomiting when these episodes occur. She has worn a heart monitor that did not show any significant arrythmias. Her family would like her urine checked to see if she has a UTI. She denies UTI symptoms. She does report vaginal itching.   She was hospitalized at the end of October for generalized weakness and has been receiving home physical therapy twice a week, which she feels has not been effective in strengthening her legs. She has a history of vertigo, previously managed with meclizine , and reports no current dizziness or room-spinning sensations. She recently saw a neurologist who discussed her seizures but not her vertigo.  She has a history of seizures and mentions possibly experiencing one in the last couple of months, though she is uncertain of the timing. She is currently on medication for seizures, which she believes has been effective, as she has not had any significant episodes recently.  She has diabetes and is under the care of Dr. Lenis for this condition. Her blood sugar  levels are often high, with readings 180-350 mg/dL 24-14% of the time. She experiences occasional numbness and tingling in her feet, but denies other symptoms  She reports a recent episode of slurred speech and facial drooping, which her family noticed during a Christmas gathering. She felt fine other than increased weakness. She was not evaluated. She does not believe she experienced a stroke and does not have a history of CVA. She does report a history of TIAs however.        ROS As per HPI.    Objective:     BP 129/78   Pulse 75   Temp 98.3 F (36.8 C) (Temporal)   Ht 5' 6 (1.676 m)   SpO2 97%   BMI 26.30 kg/m    Physical Exam Vitals and nursing note reviewed.  Constitutional:      General: She is not in acute distress.    Appearance: She is not ill-appearing, toxic-appearing or diaphoretic.  Eyes:     General: No scleral icterus.    Extraocular Movements: Extraocular movements intact.     Pupils: Pupils are equal, round, and reactive to light.  Cardiovascular:     Rate and Rhythm: Normal rate and regular rhythm.     Heart sounds: Normal heart sounds. No murmur heard. Pulmonary:     Effort: Pulmonary effort is normal. No respiratory distress.     Breath sounds: Normal breath sounds. No wheezing, rhonchi or rales.  Chest:     Chest wall: No tenderness.  Musculoskeletal:     Cervical back: Neck supple. No rigidity.     Right lower leg: No edema.     Left lower leg: No edema.  Skin:    General: Skin is warm and dry.     Coloration: Skin is not jaundiced.  Neurological:     Mental Status: She is alert and oriented to person, place, and time. Mental status is at baseline.     Cranial Nerves: No cranial nerve deficit or facial asymmetry.     Sensory: No sensory deficit.     Motor: Weakness (generalized weakness) present. No tremor or seizure activity.     Gait: Gait abnormal (arrives in wheelchair).  Psychiatric:        Mood and Affect: Mood normal.        Speech:  Speech normal.        Behavior: Behavior normal.      No results found for any visits on 09/07/24.    The ASCVD Risk score (Arnett DK, et al., 2019) failed to calculate for the following reasons:   Risk score cannot be calculated because patient has a medical history suggesting prior/existing ASCVD   * - Cholesterol units were assumed    Assessment & Plan:  Dyanara was seen today for medical management of chronic issues.  Diagnoses and all orders for this visit:  Recurrent falls -     Anemia Profile B -     CMP14+EGFR  Facial droop  Slurred speech  Generalized weakness -     Urinalysis, Routine w reflex microscopic -     Anemia Profile B -     CMP14+EGFR  B12 deficiency -     Anemia Profile B -     CMP14+EGFR  Anemia, unspecified type -     Anemia Profile B -     CMP14+EGFR  Stage 3b chronic kidney disease (HCC) -     Anemia Profile B -     CMP14+EGFR  Atrial fibrillation, unspecified type (HCC)  Hypertension associated with type 2 diabetes mellitus (HCC)  Type 2 diabetes mellitus with hyperglycemia, with long-term current use of insulin  (HCC)  Seizures (HCC)  Yeast detected -     clotrimazole  (GYNE-LOTRIMIN ) 1 % vaginal cream; Place 1 Applicatorful vaginally at bedtime for 7 days. -     Microscopic Examination  Assessment and Plan    Recurrent falls and generalized weakness Generalized weakness and falls, particularly when standing. No dizziness. Currently receiving home PT. Uses rolling walker and wheelchair.  - Complete home PT - Ordered lab work for hemoglobin, iron, B12, kidney function, electrolytes. - Check blood pressure when weak or after fall for ? orthostatic hypotension. - Encouraged increased water  intake.  Facial drooping, slurred speech Not present on exam today. Baseline neurological exam - Declines imaging for assess for CVA  Type 2 diabetes mellitus with hyperglycemia Blood sugar levels consistently above target. No recent  hypoglycemia. High blood sugar may contribute to weakness and falls. Neuropathy concern due to high blood sugar. - Encouraged compliant with diabetic regimen - Continue follow up with endocrinology.   Seizure disorder One possible seizure in last couple of months, unsure if true seizure.  - She is complaint with keppra . - Instructed to contact neurologist regarding possible seizure for potential medication adjustment.  Stage 3b chronic kidney disease Kidney function to be re-evaluated. - Ordered lab work to recheck kidney function.  Atrial fibrillation RRR on exam today. Previous monitoring showed no abnormalities.  Anemia Previous low  hemoglobin noted. - Ordered lab work to recheck hemoglobin levels.  Vitamin B12 deficiency B12 levels to be re-evaluated. Receiving monthly B12 injections.  - Ordered lab work to recheck B12 levels.  Candidiasis UA with yeast present. Suspect from skin contact. Symptoms of vaginal itching noted. - Prescribed vaginal cream for yeast infection for 7 nights. Avoid diflucan  due to QT prolongation     Return in about 3 months (around 12/06/2024) for chronic follow up.  The patient indicates understanding of these issues and agrees with the plan.   Karen CHRISTELLA Search, FNP

## 2024-09-08 ENCOUNTER — Telehealth: Payer: Self-pay | Admitting: Family Medicine

## 2024-09-08 LAB — ANEMIA PROFILE B
Basophils Absolute: 0 x10E3/uL (ref 0.0–0.2)
Basos: 0 %
EOS (ABSOLUTE): 0.3 x10E3/uL (ref 0.0–0.4)
Eos: 4 %
Ferritin: 81 ng/mL (ref 15–150)
Folate: 3.8 ng/mL (ref >3.0)
Hematocrit: 34.9 % (ref 34.0–46.6)
Hemoglobin: 11.5 g/dL (ref 11.1–15.9)
Immature Grans (Abs): 0 x10E3/uL (ref 0.0–0.1)
Immature Granulocytes: 0 %
Iron Saturation: 19 % (ref 15–55)
Iron: 41 ug/dL (ref 27–139)
Lymphocytes Absolute: 2 x10E3/uL (ref 0.7–3.1)
Lymphs: 31 %
MCH: 29.6 pg (ref 26.6–33.0)
MCHC: 33 g/dL (ref 31.5–35.7)
MCV: 90 fL (ref 79–97)
Monocytes Absolute: 0.3 x10E3/uL (ref 0.1–0.9)
Monocytes: 5 %
Neutrophils Absolute: 3.9 x10E3/uL (ref 1.4–7.0)
Neutrophils: 60 %
Platelets: 109 x10E3/uL — ABNORMAL LOW (ref 150–450)
RBC: 3.89 x10E6/uL (ref 3.77–5.28)
RDW: 13.8 % (ref 11.7–15.4)
Retic Ct Pct: 1.3 % (ref 0.6–2.6)
Total Iron Binding Capacity: 215 ug/dL — ABNORMAL LOW (ref 250–450)
UIBC: 174 ug/dL (ref 118–369)
Vitamin B-12: 1098 pg/mL (ref 232–1245)
WBC: 6.6 x10E3/uL (ref 3.4–10.8)

## 2024-09-08 LAB — CMP14+EGFR
ALT: 16 IU/L (ref 0–32)
AST: 27 IU/L (ref 0–40)
Albumin: 3.7 g/dL — ABNORMAL LOW (ref 3.8–4.8)
Alkaline Phosphatase: 69 IU/L (ref 49–135)
BUN/Creatinine Ratio: 12 (ref 12–28)
BUN: 13 mg/dL (ref 8–27)
Bilirubin Total: 0.6 mg/dL (ref 0.0–1.2)
CO2: 21 mmol/L (ref 20–29)
Calcium: 9.1 mg/dL (ref 8.7–10.3)
Chloride: 107 mmol/L — ABNORMAL HIGH (ref 96–106)
Creatinine, Ser: 1.12 mg/dL — ABNORMAL HIGH (ref 0.57–1.00)
Globulin, Total: 2.6 g/dL (ref 1.5–4.5)
Glucose: 181 mg/dL — ABNORMAL HIGH (ref 70–99)
Potassium: 3.7 mmol/L (ref 3.5–5.2)
Sodium: 144 mmol/L (ref 134–144)
Total Protein: 6.3 g/dL (ref 6.0–8.5)
eGFR: 51 mL/min/1.73 — ABNORMAL LOW (ref >59)

## 2024-09-08 NOTE — Telephone Encounter (Unsigned)
 Copied from CRM #[id number]. Topic: General - Other >> Sep 08, 2024  2:24 PM Geneva B wrote: Reason for CRM: [facility] calling to say pt bloodsugar 260 please call [phone]

## 2024-09-09 ENCOUNTER — Telehealth: Payer: Self-pay

## 2024-09-09 ENCOUNTER — Ambulatory Visit: Payer: Self-pay | Admitting: Family Medicine

## 2024-09-09 NOTE — Telephone Encounter (Signed)
 Copied from CRM #[id number]. Topic: Clinical - Lab/Test Results >> Sep 09, 2024 12:45 PM Delon DASEN wrote: Reason for CRM: calling for lab results- [id number]

## 2024-09-09 NOTE — Telephone Encounter (Signed)
 Has she checked her blood pressure when she feels this way?

## 2024-09-09 NOTE — Telephone Encounter (Signed)
 Please see results notes, pt is aware.

## 2024-09-18 ENCOUNTER — Telehealth: Payer: Self-pay

## 2024-09-18 NOTE — Telephone Encounter (Signed)
 Copied from CRM [id number]. Topic: Clinical - Red Word Triage >> Sep 16, 2024 12:40 PM Thliyah D wrote: pt says right knee pain with walking. Mild swelling.  Occurred On Dec 20,2025 still swollen.And still painful when walking. Pt is declining speaking with a nurse and says she will wait to see her doc next week.

## 2024-09-18 NOTE — Telephone Encounter (Signed)
 Called pt and offered her an appt for Monday but she has to wait and she if she can get a ride. She said she would call back.

## 2024-09-18 NOTE — Telephone Encounter (Signed)
 Copied from CRM [id number]. Topic: Clinical - Lab/Test Results >> Sep 16, 2024 12:44 PM [name] wrote: [facility] Physical therapy- Needing lab work results from last week. Need to know what medications the pt is suppose to be taking.  Call back  [phone]

## 2024-09-21 ENCOUNTER — Ambulatory Visit: Admitting: "Endocrinology

## 2024-09-22 ENCOUNTER — Ambulatory Visit: Payer: Self-pay

## 2024-09-22 NOTE — Telephone Encounter (Signed)
" °  FYI Only or Action Required?: FYI only for provider: appointment scheduled on 09/25/24.  Patient was last seen in primary care on 09/07/2024 by [name], FNP.  Called Nurse Triage reporting Knee Pain.  Symptoms began a week ago.  Interventions attempted: OTC medications: tylenol .  Symptoms are: gradually worsening.  Triage Disposition: See PCP When Office is Open (Within 3 Days)  Patient/caregiver understands and will follow disposition?: Yes   Copied from CRM [id number]. Topic: Clinical - Red Word Triage >> Sep 22, 2024  1:18 PM Rachelle R wrote: [facility] that prompted transfer to Nurse Triage: Rea from [facility] calling to advise patient is running a fever of 100, has mild chest congestion and cough, complaining of bilateral knee pain, blood sugar running high around 250, bp 150/70 Reason for Disposition  [1] MODERATE pain (e.g., interferes with normal activities, limping) AND [2] present > 3 days  Answer Assessment - Initial Assessment Questions 1. LOCATION and RADIATION: Where is the pain located?      Bilat knee 2. QUALITY: What does the pain feel like?  (e.g., sharp, dull, aching, burning)     aching 3. SEVERITY: How bad is the pain? What does it keep you from doing?   (Scale 1-10; or mild, moderate, severe)     moderate 4. ONSET: When did the pain start? Does it come and go, or is it there all the time?     2 weeks ago 5. RECURRENT: Have you had this pain before? If Yes, ask: When, and what happened then?      6. SETTING: Has there been any recent work, exercise or other activity that involved that part of the body?      NO 7. AGGRAVATING FACTORS: What makes the knee pain worse? (e.g., walking, climbing stairs, running)     Walking 8. ASSOCIATED SYMPTOMS: Is there any swelling or redness of the knee?     Had some swelling in rt that's resolved 9. OTHER SYMPTOMS: Do you have any other symptoms? (e.g., calf pain, chest pain,  difficulty breathing, fever)     denies  Protocols used: Knee Pain-A-AH  "

## 2024-09-22 NOTE — Telephone Encounter (Signed)
Noted  -LS

## 2024-09-25 ENCOUNTER — Encounter: Payer: Self-pay | Admitting: Family Medicine

## 2024-09-25 ENCOUNTER — Ambulatory Visit: Admitting: Family Medicine

## 2024-09-25 VITALS — BP 182/80 | HR 86 | Ht 66.0 in | Wt 159.0 lb

## 2024-09-25 DIAGNOSIS — E1165 Type 2 diabetes mellitus with hyperglycemia: Secondary | ICD-10-CM

## 2024-09-25 DIAGNOSIS — M1711 Unilateral primary osteoarthritis, right knee: Secondary | ICD-10-CM

## 2024-09-25 DIAGNOSIS — Z794 Long term (current) use of insulin: Secondary | ICD-10-CM

## 2024-09-25 MED ORDER — METHYLPREDNISOLONE ACETATE 80 MG/ML IJ SUSP
80.0000 mg | Freq: Once | INTRAMUSCULAR | Status: AC
  Administered 2024-09-25: 80 mg via INTRA_ARTICULAR

## 2024-09-25 NOTE — Progress Notes (Signed)
 "  BP (!) 182/80   Pulse 86   Ht 5' 6 (1.676 m)   Wt 159 lb (72.1 kg)   SpO2 96%   BMI 25.66 kg/m    Subjective:   Patient ID: [name], female    DOB: Oct 19, 1950, 74 y.o.   MRN: [id number]  HPI: [name] is a 74 y.o. female presenting on 09/25/2024 for Knee Pain (Bilateral)   Discussed the use of AI scribe software for clinical note transcription with the patient, who gave verbal consent to proceed.  History of Present Illness   [name] is a 74 year old female with significant arthritis who presents with right knee pain.  Right knee pain - Onset three weeks ago, sudden onset while rising from bed and felt a 'pop' in the knee - Pain localized to the entire knee - No improvement since onset - History of 'bone on bone' arthritis in the knee - Previous knee injections provided relief, but current pain persists  Impaired mobility - Unable to ambulate without assistance since onset of knee pain - Requires walker or wheelchair for mobility, which is not her baseline  Hyperglycemia - Elevated blood glucose this morning, described as 'two something'          Relevant past medical, surgical, family and social history reviewed and updated as indicated. Interim medical history since our last visit reviewed. Allergies and medications reviewed and updated.  Review of Systems  Constitutional:  Negative for chills and fever.  Musculoskeletal:  Positive for arthralgias, gait problem and joint swelling. Negative for back pain.  Skin:  Negative for rash.  Neurological:  Negative for dizziness, light-headedness and headaches.  Psychiatric/Behavioral:  Negative for agitation and behavioral problems.   All other systems reviewed and are negative.   Per HPI unless specifically indicated above   Allergies as of 09/25/2024       Reactions   Iohexol  Anaphylaxis, Nausea Only   Pt had syncopal episode with nausea post IV CM late 1990's,  pt has had prednisone  prep with heart  caths x 2 without problem  kdean 04/16/07, Onset Date: 04-15-1996   Codeine Nausea And Vomiting, Palpitations   Metformin  And Related Diarrhea   Ticlid [ticlopidine Hcl] Nausea And Vomiting   Jardiance  [empagliflozin ] Other (See Comments)   Recurrent UTIs        Medication List        Accurate as of September 25, 2024  9:20 AM. If you have any questions, ask your nurse or doctor.          rOPINIRole  1 MG tablet Commonly known as: REQUIP  TAKE 1 TABLET BY MOUTH DAILY AS NEEDED The timing of this medication is very important.   acetaminophen  500 MG tablet Commonly known as: TYLENOL  Take 1,000 mg by mouth every 6 (six) hours as needed for mild pain (pain score 1-3) or headache.   aspirin  81 MG chewable tablet Chew 1 tablet (81 mg total) by mouth daily.   Blood Glucose Monitoring Suppl Devi 1 each by Does not apply route in the morning, at noon, and at bedtime. May substitute to any manufacturer covered by patient's insurance.   clopidogrel  75 MG tablet Commonly known as: PLAVIX  Take 1 tablet (75 mg total) by mouth daily with breakfast.   empagliflozin  25 MG Tabs tablet Commonly known as: Jardiance  Take 1 tablet (25 mg total) by mouth daily before breakfast.   famotidine  20 MG tablet Commonly known as: Pepcid  Take 1 tablet (20  mg total) by mouth 2 (two) times daily.   FreeStyle Libre 3 Plus Sensor Misc CHANGE EVERY 15 DAYS. APPLY TO BACK OF UPPER ARM   isosorbide  mononitrate 60 MG 24 hr tablet Commonly known as: IMDUR  Take 1 tablet (60 mg total) by mouth daily.   levETIRAcetam  500 MG tablet Commonly known as: Keppra  Take 1 tablet (500 mg total) by mouth 2 (two) times daily.   levocetirizine 5 MG tablet Commonly known as: XYZAL  TAKE 1/2 TABLET BY MOUTH EVERY EVENING   lisinopril  10 MG tablet Commonly known as: ZESTRIL  Take 1 tablet (10 mg total) by mouth daily.   loratadine  10 MG tablet Commonly known as: CLARITIN  Take 10 mg by mouth daily as needed for  allergies.   meclizine  25 MG tablet Commonly known as: ANTIVERT  Take 1 tablet (25 mg total) by mouth 3 (three) times daily as needed for dizziness.   metoprolol  tartrate 25 MG tablet Commonly known as: LOPRESSOR  Take 1 tablet (25 mg total) by mouth 2 (two) times daily.   MUSCLE CRAMP COMPLEX PO Take 1 tablet by mouth as needed (muscle spasms).   nitroGLYCERIN  0.4 MG SL tablet Commonly known as: NITROSTAT  DISSOLVE 1 TABLET UNDER TONGUE FOR CHESTPAIN.MAY REPEAT EVERY 5 MINUTES FOR 3 DOSES.IF NO RELIEF CALL 911 OR GO TO ER   NovoLOG  Mix 70/30 FlexPen (70-30) 100 UNIT/ML FlexPen Generic drug: insulin  aspart protamine  - aspart Inject 20 Units into the skin 2 (two) times daily with a meal.   rosuvastatin  20 MG tablet Commonly known as: CRESTOR  TAKE ONE (1) TABLET BY MOUTH EVERY DAY   sertraline  50 MG tablet Commonly known as: ZOLOFT  Take 1.5 tablets (75 mg total) by mouth daily.   Tradjenta  5 MG Tabs tablet Generic drug: linagliptin  Take 1 tablet (5 mg total) by mouth daily.   traZODone  50 MG tablet Commonly known as: DESYREL  Take 1 tablet (50 mg total) by mouth at bedtime as needed for sleep.         Objective:   BP (!) 182/80   Pulse 86   Ht 5' 6 (1.676 m)   Wt 159 lb (72.1 kg)   SpO2 96%   BMI 25.66 kg/m   Wt Readings from Last 3 Encounters:  09/25/24 159 lb (72.1 kg)  07/18/24 162 lb 14.7 oz (73.9 kg)  07/16/24 166 lb 9.6 oz (75.6 kg)    Physical Exam Physical Exam   MUSCULOSKELETAL: Right knee swelling.  Pain with range of motion.  Crepitus present       Knee injection: Consent form signed. Risk factors of bleeding and infection discussed with patient and patient is agreeable towards injection. Patient prepped with Betadine. Lateral approach towards injection used. Injected 80 mg of Depo-Medrol  and 1 mL of 2% lidocaine . Patient tolerated procedure well and no side effects from noted. Minimal to no bleeding. Simple bandage applied after.  Assessment &  Plan:   Problem List Items Addressed This Visit   None Visit Diagnoses       Primary osteoarthritis of right knee    -  Primary   Relevant Medications   methylPREDNISolone  acetate (DEPO-MEDROL ) injection 80 mg (Completed)          Primary osteoarthritis of right knee Chronic right knee pain with swelling and popping sensation. Previous steroid injections effective. Current treatment focused on right knee to manage blood glucose impact. - Administered Depalmedrol 80 mg, 1 cc of epinephrine , and 1 cc of lidocaine  without epinephrine  to the right knee.  Type 2  diabetes mellitus Elevated blood glucose likely due to steroid use. Informed her to monitor glucose and adjust insulin . - Monitor blood glucose levels closely. - Adjust insulin  dosage as needed over the next week.          Follow up plan: Return if symptoms worsen or fail to improve.  Counseling provided for all of the vaccine components No orders of the defined types were placed in this encounter.   Fonda Levins, MD [facility] 09/25/2024, 9:20 AM     "

## 2024-09-30 ENCOUNTER — Telehealth: Payer: Self-pay

## 2024-09-30 NOTE — Telephone Encounter (Signed)
 Called and spoke with Ted and gave her verbal approval for Skilled Nursing Evaluation for tomorrow or Thursday.

## 2024-09-30 NOTE — Telephone Encounter (Signed)
 Copied from CRM [id number]. Topic: Clinical - Home Health Verbal Orders >> Sep 29, 2024  4:47 PM Graeme ORN wrote: Caller/Agency: Ted Nurse [phone] (Provider) Callback Number: [phone] Secure VM  Service Requested: Skilled Nursing Frequency: Requesting Evaluation for Tomorrow or Thursday  Any new concerns about the patient? N/A

## 2024-10-02 NOTE — Telephone Encounter (Signed)
 Reached out to [place] at [facility]. Left message for her to contact us .

## 2024-10-05 ENCOUNTER — Ambulatory Visit (INDEPENDENT_AMBULATORY_CARE_PROVIDER_SITE_OTHER): Admitting: *Deleted

## 2024-10-05 DIAGNOSIS — E538 Deficiency of other specified B group vitamins: Secondary | ICD-10-CM | POA: Diagnosis not present

## 2024-10-05 NOTE — Progress Notes (Signed)
 Patient is in office today for a nurse visit for B12 Injection. Patient Injection was given in the  Left deltoid. Patient tolerated injection well.

## 2024-10-09 ENCOUNTER — Encounter (HOSPITAL_COMMUNITY): Payer: Self-pay

## 2024-10-09 ENCOUNTER — Emergency Department (HOSPITAL_COMMUNITY)
Admission: EM | Admit: 2024-10-09 | Discharge: 2024-10-09 | Disposition: A | Discharge status: Home / Self Care | Attending: Emergency Medicine | Admitting: Emergency Medicine

## 2024-10-09 ENCOUNTER — Other Ambulatory Visit: Payer: Self-pay

## 2024-10-09 DIAGNOSIS — R739 Hyperglycemia, unspecified: Secondary | ICD-10-CM

## 2024-10-09 DIAGNOSIS — Z7982 Long term (current) use of aspirin: Secondary | ICD-10-CM | POA: Insufficient documentation

## 2024-10-09 DIAGNOSIS — E1165 Type 2 diabetes mellitus with hyperglycemia: Secondary | ICD-10-CM | POA: Insufficient documentation

## 2024-10-09 DIAGNOSIS — Z794 Long term (current) use of insulin: Secondary | ICD-10-CM | POA: Diagnosis not present

## 2024-10-09 DIAGNOSIS — I2510 Atherosclerotic heart disease of native coronary artery without angina pectoris: Secondary | ICD-10-CM | POA: Insufficient documentation

## 2024-10-09 DIAGNOSIS — G40909 Epilepsy, unspecified, not intractable, without status epilepticus: Secondary | ICD-10-CM | POA: Insufficient documentation

## 2024-10-09 DIAGNOSIS — I509 Heart failure, unspecified: Secondary | ICD-10-CM | POA: Insufficient documentation

## 2024-10-09 LAB — BASIC METABOLIC PANEL WITH GFR
Anion gap: 16 — ABNORMAL HIGH (ref 5–15)
BUN: 16 mg/dL (ref 8–23)
CO2: 25 mmol/L (ref 22–32)
Calcium: 9.1 mg/dL (ref 8.9–10.3)
Chloride: 95 mmol/L — ABNORMAL LOW (ref 98–111)
Creatinine, Ser: 1.12 mg/dL — ABNORMAL HIGH (ref 0.44–1.00)
GFR, Estimated: 51 mL/min — ABNORMAL LOW
Glucose, Bld: 571 mg/dL (ref 70–99)
Potassium: 3.1 mmol/L — ABNORMAL LOW (ref 3.5–5.1)
Sodium: 136 mmol/L (ref 135–145)

## 2024-10-09 LAB — URINALYSIS, ROUTINE W REFLEX MICROSCOPIC
Bacteria, UA: NONE SEEN
Bilirubin Urine: NEGATIVE
Glucose, UA: >=500 mg/dL — AB
Ketones, ur: NEGATIVE mg/dL
Leukocytes,Ua: NEGATIVE
Nitrite: NEGATIVE
Protein, ur: NEGATIVE mg/dL
Specific Gravity, Urine: 1.021 (ref 1.005–1.030)
pH: 8 (ref 5.0–8.0)

## 2024-10-09 LAB — CBC WITH DIFFERENTIAL/PLATELET
Abs Immature Granulocytes: 0.01 K/uL (ref 0.00–0.07)
Basophils Absolute: 0 K/uL (ref 0.0–0.1)
Basophils Relative: 0 %
Eosinophils Absolute: 0.1 K/uL (ref 0.0–0.5)
Eosinophils Relative: 2 %
HCT: 35.4 % — ABNORMAL LOW (ref 36.0–46.0)
Hemoglobin: 11.7 g/dL — ABNORMAL LOW (ref 12.0–15.0)
Immature Granulocytes: 0 %
Lymphocytes Relative: 29 %
Lymphs Abs: 1.8 K/uL (ref 0.7–4.0)
MCH: 29.1 pg (ref 26.0–34.0)
MCHC: 33.1 g/dL (ref 30.0–36.0)
MCV: 88.1 fL (ref 80.0–100.0)
Monocytes Absolute: 0.3 K/uL (ref 0.1–1.0)
Monocytes Relative: 6 %
Neutro Abs: 3.9 K/uL (ref 1.7–7.7)
Neutrophils Relative %: 63 %
Platelets: 177 K/uL (ref 150–400)
RBC: 4.02 MIL/uL (ref 3.87–5.11)
RDW: 13.6 % (ref 11.5–15.5)
WBC: 6.1 K/uL (ref 4.0–10.5)
nRBC: 0 % (ref 0.0–0.2)

## 2024-10-09 LAB — CBG MONITORING, ED
Glucose-Capillary: 560 mg/dL (ref 70–99)
Glucose-Capillary: 339 mg/dL — ABNORMAL HIGH (ref 70–99)

## 2024-10-09 MED ORDER — LEVETIRACETAM (KEPPRA) 500 MG/5 ML ADULT IV PUSH
1000.0000 mg | Freq: Once | INTRAVENOUS | Status: AC
  Administered 2024-10-09: 1000 mg via INTRAVENOUS
  Filled 2024-10-09: qty 10

## 2024-10-09 MED ORDER — POTASSIUM CHLORIDE 10 MEQ/100ML IV SOLN
10.0000 meq | Freq: Once | INTRAVENOUS | Status: AC
  Administered 2024-10-09: 10 meq via INTRAVENOUS
  Filled 2024-10-09: qty 100

## 2024-10-09 MED ORDER — LACTATED RINGERS IV BOLUS
1000.0000 mL | Freq: Once | INTRAVENOUS | Status: AC
  Administered 2024-10-09: 1000 mL via INTRAVENOUS

## 2024-10-09 MED ORDER — INSULIN ASPART 100 UNIT/ML IJ SOLN
8.0000 [IU] | Freq: Once | INTRAMUSCULAR | Status: AC
  Administered 2024-10-09: 8 [IU] via SUBCUTANEOUS
  Filled 2024-10-09: qty 1

## 2024-10-09 NOTE — Discharge Instructions (Signed)
 You were evaluated in the emergency room for hypoglycemia and a seizure.  You were noted to have elevated blood sugar levels.  Please continue to take your Keppra , insulin  and potassium supplementation.  Please follow-up with a neurologist for further evaluation.

## 2024-10-09 NOTE — ED Triage Notes (Signed)
 PT brung in from [facility] with hyperglycemia. CBG was 521 and HTN 210/70. PT denies pain, and N/V at this time.

## 2024-10-09 NOTE — ED Provider Notes (Signed)
 Signout received from Karen Idol, PA-C.  In short patient is a 74 year old female with history of insulin -dependent diabetes, seizure disorder who presents with complaints of hyperglycemia in addition to a seizure that occurred today.  Patient reports that she was busy today and did not take her Keppra  or insulin .  She has had significant workup for her seizures recently.  They have been well-managed on Keppra .  However when not taking it today she had 2 seizures this afternoon.  1 witnessed by EMS and the other during her ER visit.  Workup is notable for a glucose of 571.  Mildly elevated anion gap without any evidence of acidosis or ketone urea.  Patient will be monitored before discharge Physical Exam  BP (!) 178/72 (BP Location: Left Arm)   Pulse 67   Temp 97.8 F (36.6 C) (Oral)   Resp 16   Ht 5' 6 (1.676 m)   Wt 64.4 kg   SpO2 95%   BMI 22.92 kg/m   Physical Exam Vitals and nursing note reviewed.  Constitutional:      General: She is not in acute distress.    Appearance: She is well-developed.  HENT:     Head: Normocephalic and atraumatic.  Eyes:     Conjunctiva/sclera: Conjunctivae normal.  Cardiovascular:     Rate and Rhythm: Normal rate and regular rhythm.     Heart sounds: No murmur heard. Pulmonary:     Effort: Pulmonary effort is normal. No respiratory distress.     Breath sounds: Normal breath sounds.  Abdominal:     Palpations: Abdomen is soft.     Tenderness: There is no abdominal tenderness.  Musculoskeletal:        General: No swelling.     Cervical back: Neck supple.  Skin:    General: Skin is warm and dry.     Capillary Refill: Capillary refill takes less than 2 seconds.  Neurological:     General: No focal deficit present.     Mental Status: She is alert.  Psychiatric:        Mood and Affect: Mood normal.     Procedures  Procedures  ED Course / MDM    Medical Decision Making Amount and/or Complexity of Data Reviewed Labs:  ordered.  Risk Prescription drug management.   Patient reevaluated.  She is completely asymptomatic at this time and feels great.  States she wants to go home.  No recurrent seizures.  Glucose is improved to 330.  She has received fluids in addition to potassium supplementation.  Has potassium at home.  Patient will be discharged home.  Encouraged compliance with her Keppra , insulin  and potassium.  Strict return precautions provided.  Patient is understanding agreeable to plan.  Has supportive family nearby.   Patient will be discharged home. The patient has been appropriately medically screened and/or stabilized in the ED. I have low suspicion for any other emergent medical condition which would require further screening, evaluation or treatment in the ED or require inpatient management. At time of discharge the patient is hemodynamically stable and in no acute distress. I have discussed work-up results and diagnosis with patient and answered all questions. Patient is agreeable with discharge plan. We discussed strict return precautions for returning to the emergency department and they verbalized understanding.          Donnajean Lynwood DEL, PA-C 10/09/24 2117    Suzette Pac, MD 10/11/24 [phone]

## 2024-10-09 NOTE — ED Triage Notes (Signed)
 EMS also stated pt appear to possibly had a seizure upon arrival.

## 2024-10-09 NOTE — ED Provider Notes (Signed)
 " [place] EMERGENCY DEPARTMENT AT [facility] Provider Note   CSN: [id number] Arrival date & time: 10/09/24  1651     Patient presents with: Hyperglycemia   [name] is a 74 y.o. female with a history significant for CAD, history of CVA, MS, CHF, insulin -dependent diabetes, and seizure disorder under the care of Dr. Gregg presenting for evaluation of hyperglycemia and a seizure event that occurred and route per EMS.  She states she has not felt well today and spent the day running errands including an eye exam, multiple other activities which she states has caused her to be fatigued.  She left the house this morning before she took some of her morning medications including her Keppra .  When she arrived home she realized her blood sugar was over 500 and EMS was called.  And route there was at least a brief episode of seizure noted by EMS personnel.  Upon initial evaluation the patient I witnessed another seizure, she had difficulty speaking and had numbness in her left face, this episode lasted approximately 1 minute then resolved.  Patient endorses this is her typical seizure, she is awake during these episodes.  She denies recent illness, nausea vomiting, denies abdominal pain, chest pain, no shortness of breath.   The history is provided by the patient.       Prior to Admission medications  Medication Sig Start Date End Date Taking? Authorizing Provider  acetaminophen  (TYLENOL ) 500 MG tablet Take 1,000 mg by mouth every 6 (six) hours as needed for mild pain (pain score 1-3) or headache.    [provider]  aspirin  81 MG chewable tablet Chew 1 tablet (81 mg total) by mouth daily. 03/28/22   Rai, Nydia POUR, MD  Blood Glucose Monitoring Suppl DEVI 1 each by Does not apply route in the morning, at noon, and at bedtime. May substitute to any manufacturer covered by patient's insurance. 06/19/23   [name], FNP  clopidogrel  (PLAVIX ) 75 MG tablet Take 1 tablet  (75 mg total) by mouth daily with breakfast. 10/02/23   Pietro, Redell RAMAN, MD  Continuous Glucose Sensor (FREESTYLE LIBRE 3 PLUS SENSOR) MISC CHANGE EVERY 15 DAYS. APPLY TO BACK OF UPPER ARM 07/23/24   [name], FNP  empagliflozin  (JARDIANCE ) 25 MG TABS tablet Take 1 tablet (25 mg total) by mouth daily before breakfast. 07/16/24   Nida, Gebreselassie W, MD  famotidine  (PEPCID ) 20 MG tablet Take 1 tablet (20 mg total) by mouth 2 (two) times daily. 02/28/24   [name], FNP  Homeopathic Products (MUSCLE CRAMP COMPLEX PO) Take 1 tablet by mouth as needed (muscle spasms).    [provider]  insulin  aspart protamine  - aspart (NOVOLOG  MIX 70/30 FLEXPEN) (70-30) 100 UNIT/ML FlexPen Inject 20 Units into the skin 2 (two) times daily with a meal. 08/18/24   Nida, Gebreselassie W, MD  isosorbide  mononitrate (IMDUR ) 60 MG 24 hr tablet Take 1 tablet (60 mg total) by mouth daily. 03/25/24   Lavona Agent, MD  levETIRAcetam  (KEPPRA ) 500 MG tablet Take 1 tablet (500 mg total) by mouth 2 (two) times daily. 06/30/24 06/25/25  Camara, Amadou, MD  levocetirizine (XYZAL ) 5 MG tablet TAKE 1/2 TABLET BY MOUTH EVERY EVENING 06/29/24   [name], FNP  linagliptin  (TRADJENTA ) 5 MG TABS tablet Take 1 tablet (5 mg total) by mouth daily. 05/22/24   Ghimire, Donalda HERO, MD  lisinopril  (ZESTRIL ) 10 MG tablet Take 1 tablet (10 mg total) by mouth  daily. 11/21/22   Lavona Agent, MD  loratadine  (CLARITIN ) 10 MG tablet Take 10 mg by mouth daily as needed for allergies.    [provider]  meclizine  (ANTIVERT ) 25 MG tablet Take 1 tablet (25 mg total) by mouth 3 (three) times daily as needed for dizziness. 07/19/24   Arrien, Mauricio Daniel, MD  metoprolol  tartrate (LOPRESSOR ) 25 MG tablet Take 1 tablet (25 mg total) by mouth 2 (two) times daily. 05/08/24   [name], FNP  nitroGLYCERIN  (NITROSTAT ) 0.4 MG SL tablet DISSOLVE 1 TABLET UNDER TONGUE FOR CHESTPAIN.MAY REPEAT EVERY 5 MINUTES FOR 3  DOSES.IF NO RELIEF CALL 911 OR GO TO ER 03/09/24   [name], FNP  rOPINIRole  (REQUIP ) 1 MG tablet TAKE 1 TABLET BY MOUTH DAILY AS NEEDED 05/06/24   [name], FNP  rosuvastatin  (CRESTOR ) 20 MG tablet TAKE ONE (1) TABLET BY MOUTH EVERY DAY 02/10/24   [name], FNP  sertraline  (ZOLOFT ) 50 MG tablet Take 1.5 tablets (75 mg total) by mouth daily. 05/06/24   [name], FNP  traZODone  (DESYREL ) 50 MG tablet Take 1 tablet (50 mg total) by mouth at bedtime as needed for sleep. 05/06/24   [name], FNP    Allergies: Iohexol , Codeine, Metformin  and related, Ticlid [ticlopidine hcl], and Jardiance  [empagliflozin ]    Review of Systems  Constitutional:  Positive for fatigue. Negative for fever.  HENT:  Negative for congestion and sore throat.   Eyes: Negative.   Respiratory:  Negative for chest tightness and shortness of breath.   Cardiovascular:  Negative for chest pain.  Gastrointestinal:  Negative for abdominal pain, nausea and vomiting.  Endocrine: Positive for polydipsia and polyuria.  Musculoskeletal:  Negative for arthralgias, joint swelling and neck pain.  Skin: Negative.  Negative for rash and wound.  Neurological:  Positive for seizures. Negative for dizziness, weakness, light-headedness, numbness and headaches.  Psychiatric/Behavioral: Negative.      Updated Vital Signs BP (!) 209/82   Pulse 69   Temp 97.8 F (36.6 C) (Oral)   Resp 18   Ht 5' 6 (1.676 m)   Wt 64.4 kg   SpO2 94%   BMI 22.92 kg/m   Physical Exam Vitals and nursing note reviewed.  Constitutional:      Appearance: She is well-developed.  HENT:     Head: Normocephalic and atraumatic.  Eyes:     Conjunctiva/sclera: Conjunctivae normal.  Cardiovascular:     Rate and Rhythm: Normal rate and regular rhythm.     Heart sounds: Normal heart sounds.  Pulmonary:     Effort: Pulmonary effort is normal.     Breath sounds: Normal breath sounds. No wheezing.  Abdominal:      General: Bowel sounds are normal.     Palpations: Abdomen is soft.     Tenderness: There is no abdominal tenderness.  Musculoskeletal:        General: Normal range of motion.     Cervical back: Normal range of motion.  Skin:    General: Skin is warm and dry.  Neurological:     Mental Status: She is alert and oriented to person, place, and time.     Sensory: Sensory deficit present.     Coordination: Coordination normal.     Comments: Brief numbness left face which resolved after seizure event.     (all labs ordered are listed, but only abnormal results are displayed) Labs Reviewed  URINALYSIS, ROUTINE W REFLEX MICROSCOPIC - Abnormal; Notable for  the following components:      Result Value   Color, Urine COLORLESS (*)    Glucose, UA >=500 (*)    Hgb urine dipstick MODERATE (*)    All other components within normal limits  BASIC METABOLIC PANEL WITH GFR - Abnormal; Notable for the following components:   Potassium 3.1 (*)    Chloride 95 (*)    Glucose, Bld 571 (*)    Creatinine, Ser 1.12 (*)    GFR, Estimated 51 (*)    Anion gap 16 (*)    All other components within normal limits  CBC WITH DIFFERENTIAL/PLATELET - Abnormal; Notable for the following components:   Hemoglobin 11.7 (*)    HCT 35.4 (*)    All other components within normal limits  CBG MONITORING, ED - Abnormal; Notable for the following components:   Glucose-Capillary 560 (*)    All other components within normal limits    EKG: None  Radiology: No results found.   Procedures   Medications Ordered in the ED  lactated ringers  bolus 1,000 mL (1,000 mLs Intravenous New Bag/Given 10/09/24 1806)  insulin  aspart (novoLOG ) injection 8 Units (8 Units Subcutaneous Given 10/09/24 1816)  levETIRAcetam  (KEPPRA ) undiluted injection 1,000 mg (1,000 mg Intravenous Given 10/09/24 1806)                                    Medical Decision Making Presenting for evaluation of hyperglycemia, she also missed her morning  dose of Keppra  and has had 2 seizures this afternoon 1 and route witnessed by EMS and 1 witnessed by me, lasting approximately 1 minute, focal seizure involving dysarthria and left facial numbness which is consistent with her prior seizure features.  She does not have any symptoms suggesting DKA but labs have been ordered to rule out this possibility.  IV fluids and subcu insulin  have also been ordered.  IV Keppra  also ordered as she missed this morning's Keppra  dose.  If no DKA, and CBG improves, suspect this patient can be discharged to home.  Discussed with PA Donnajean who assumes care.  Amount and/or Complexity of Data Reviewed Labs: ordered.  Risk Prescription drug management.        Final diagnoses:  Hyperglycemia  Seizure disorder [facility] [place])    ED Discharge Orders     None          Phoebe Marter, PA-C 10/09/24 JACKEY Suzette Pac, MD 10/11/24 [phone]  "

## 2024-10-12 ENCOUNTER — Other Ambulatory Visit: Payer: Self-pay | Admitting: Family Medicine

## 2024-10-12 DIAGNOSIS — E1169 Type 2 diabetes mellitus with other specified complication: Secondary | ICD-10-CM

## 2024-10-19 ENCOUNTER — Other Ambulatory Visit: Payer: Self-pay | Admitting: Family Medicine

## 2024-10-21 ENCOUNTER — Ambulatory Visit: Admitting: "Endocrinology

## 2024-10-21 ENCOUNTER — Telehealth: Payer: Self-pay | Admitting: Family Medicine

## 2024-10-21 NOTE — Telephone Encounter (Signed)
 Copied from CRM #[id number]. Topic: Referral - Request for Referral >> Oct 21, 2024  9:38 AM Marda MATSU wrote: Per Dorothe with [phone]  # [phone] Referral request has expired, for Physical Therapy. A new evaluation is necessary Please fax request to [phone]

## 2024-10-21 NOTE — Telephone Encounter (Signed)
 Okay to place new referral for Physical Therapy?

## 2024-10-22 NOTE — Telephone Encounter (Signed)
 Pt aware she ntbs for face to face and will call back to schedule appt when she is ready and the weather is better.

## 2024-10-22 NOTE — Telephone Encounter (Signed)
 If it's for [facility] PT, she will need a F2F for insurance.

## 2024-11-05 ENCOUNTER — Ambulatory Visit

## 2024-12-10 ENCOUNTER — Ambulatory Visit: Admitting: Family Medicine
